# Patient Record
Sex: Male | Born: 1940
Health system: Southern US, Community
[De-identification: ages and names within clinical notes are randomized; demographics above are authoritative.]

## PROBLEM LIST (undated history)

## (undated) DIAGNOSIS — I700 Atherosclerosis of aorta: Secondary | ICD-10-CM

## (undated) DIAGNOSIS — N189 Chronic kidney disease, unspecified: Secondary | ICD-10-CM

## (undated) DIAGNOSIS — I771 Stricture of artery: Secondary | ICD-10-CM

## (undated) DIAGNOSIS — I714 Abdominal aortic aneurysm, without rupture, unspecified: Secondary | ICD-10-CM

## (undated) DIAGNOSIS — I739 Peripheral vascular disease, unspecified: Secondary | ICD-10-CM

## (undated) DIAGNOSIS — I5030 Unspecified diastolic (congestive) heart failure: Secondary | ICD-10-CM

## (undated) DIAGNOSIS — J189 Pneumonia, unspecified organism: Secondary | ICD-10-CM

## (undated) DIAGNOSIS — I615 Nontraumatic intracerebral hemorrhage, intraventricular: Secondary | ICD-10-CM

## (undated) DIAGNOSIS — M1990 Unspecified osteoarthritis, unspecified site: Secondary | ICD-10-CM

## (undated) DIAGNOSIS — I350 Nonrheumatic aortic (valve) stenosis: Secondary | ICD-10-CM

## (undated) DIAGNOSIS — Z720 Tobacco use: Secondary | ICD-10-CM

## (undated) DIAGNOSIS — J449 Chronic obstructive pulmonary disease, unspecified: Secondary | ICD-10-CM

## (undated) DIAGNOSIS — I701 Atherosclerosis of renal artery: Secondary | ICD-10-CM

## (undated) DIAGNOSIS — E785 Hyperlipidemia, unspecified: Secondary | ICD-10-CM

## (undated) DIAGNOSIS — I779 Disorder of arteries and arterioles, unspecified: Secondary | ICD-10-CM

## (undated) DIAGNOSIS — E039 Hypothyroidism, unspecified: Secondary | ICD-10-CM

## (undated) DIAGNOSIS — I255 Ischemic cardiomyopathy: Secondary | ICD-10-CM

## (undated) DIAGNOSIS — I872 Venous insufficiency (chronic) (peripheral): Secondary | ICD-10-CM

## (undated) DIAGNOSIS — I10 Essential (primary) hypertension: Secondary | ICD-10-CM

## (undated) DIAGNOSIS — I480 Paroxysmal atrial fibrillation: Secondary | ICD-10-CM

## (undated) DIAGNOSIS — I82401 Acute embolism and thrombosis of unspecified deep veins of right lower extremity: Secondary | ICD-10-CM

## (undated) DIAGNOSIS — I2510 Atherosclerotic heart disease of native coronary artery without angina pectoris: Secondary | ICD-10-CM

## (undated) DIAGNOSIS — I509 Heart failure, unspecified: Secondary | ICD-10-CM

## (undated) HISTORY — DX: Acute embolism and thrombosis of unspecified deep veins of right lower extremity: I82.401

## (undated) HISTORY — DX: Nontraumatic intracerebral hemorrhage, intraventricular: I61.5

## (undated) HISTORY — DX: Paroxysmal atrial fibrillation: I48.0

## (undated) HISTORY — DX: Ischemic cardiomyopathy: I25.5

## (undated) HISTORY — DX: Disorder of arteries and arterioles, unspecified: I77.9

## (undated) HISTORY — DX: Stricture of artery: I77.1

## (undated) HISTORY — PX: CORONARY ARTERY BYPASS GRAFT: SHX141

## (undated) HISTORY — DX: Chronic kidney disease, unspecified: N18.9

## (undated) HISTORY — DX: Tobacco use: Z72.0

## (undated) HISTORY — DX: Atherosclerosis of aorta: I70.0

## (undated) HISTORY — PX: HERNIA REPAIR: SHX51

## (undated) HISTORY — DX: Unspecified diastolic (congestive) heart failure: I50.30

## (undated) HISTORY — PX: TOOTH EXTRACTION: SUR596

## (undated) HISTORY — DX: Unspecified osteoarthritis, unspecified site: M19.90

## (undated) HISTORY — DX: Hyperlipidemia, unspecified: E78.5

## (undated) HISTORY — PX: CHOLECYSTECTOMY: SHX55

## (undated) HISTORY — DX: Chronic obstructive pulmonary disease, unspecified: J44.9

## (undated) HISTORY — DX: Nonrheumatic aortic (valve) stenosis: I35.0

## (undated) HISTORY — DX: Venous insufficiency (chronic) (peripheral): I87.2

## (undated) HISTORY — DX: Peripheral vascular disease, unspecified: I73.9

## (undated) HISTORY — DX: Pneumonia, unspecified organism: J18.9

## (undated) SURGERY — LOWER EXTREMITY ANGIOGRAPHY
Anesthesia: Moderate Sedation

---

## 2006-07-11 ENCOUNTER — Ambulatory Visit: Payer: Self-pay | Admitting: Family Medicine

## 2006-09-07 ENCOUNTER — Ambulatory Visit: Payer: Self-pay | Admitting: Family Medicine

## 2006-09-07 DIAGNOSIS — E039 Hypothyroidism, unspecified: Secondary | ICD-10-CM | POA: Insufficient documentation

## 2006-09-08 LAB — CONVERTED CEMR LAB
CO2: 29 meq/L (ref 19–32)
Cholesterol: 152 mg/dL (ref 0–200)
HDL: 40.5 mg/dL (ref >39.0)
LDL Cholesterol: 93 mg/dL (ref 0–99)
PSA: 6.95 ng/mL — ABNORMAL HIGH (ref 0.10–4.00)
Potassium: 4.3 meq/L (ref 3.5–5.1)

## 2006-09-15 ENCOUNTER — Encounter: Payer: Self-pay | Admitting: Family Medicine

## 2006-09-25 ENCOUNTER — Encounter (INDEPENDENT_AMBULATORY_CARE_PROVIDER_SITE_OTHER): Payer: Self-pay | Admitting: *Deleted

## 2006-09-25 ENCOUNTER — Ambulatory Visit: Payer: Self-pay | Admitting: Family Medicine

## 2008-03-18 ENCOUNTER — Telehealth (INDEPENDENT_AMBULATORY_CARE_PROVIDER_SITE_OTHER): Payer: Self-pay | Admitting: *Deleted

## 2008-03-19 ENCOUNTER — Encounter (INDEPENDENT_AMBULATORY_CARE_PROVIDER_SITE_OTHER): Payer: Self-pay | Admitting: *Deleted

## 2008-04-11 ENCOUNTER — Telehealth: Payer: Self-pay | Admitting: Family Medicine

## 2008-04-11 ENCOUNTER — Ambulatory Visit: Payer: Self-pay | Admitting: Family Medicine

## 2008-04-11 DIAGNOSIS — I739 Peripheral vascular disease, unspecified: Secondary | ICD-10-CM | POA: Insufficient documentation

## 2008-04-11 DIAGNOSIS — Z87891 Personal history of nicotine dependence: Secondary | ICD-10-CM | POA: Insufficient documentation

## 2008-04-11 DIAGNOSIS — F17200 Nicotine dependence, unspecified, uncomplicated: Secondary | ICD-10-CM

## 2008-04-11 DIAGNOSIS — R972 Elevated prostate specific antigen [PSA]: Secondary | ICD-10-CM | POA: Insufficient documentation

## 2008-04-11 HISTORY — DX: Personal history of nicotine dependence: Z87.891

## 2008-04-16 ENCOUNTER — Ambulatory Visit: Payer: Self-pay | Admitting: Family Medicine

## 2008-04-23 LAB — CONVERTED CEMR LAB
Alkaline Phosphatase: 68 units/L (ref 39–117)
BUN: 14 mg/dL (ref 6–23)
CO2: 29 meq/L (ref 19–32)
Chloride: 108 meq/L (ref 96–112)
Cholesterol: 167 mg/dL (ref 0–200)
GFR calc Af Amer: 86 mL/min
HDL: 48.6 mg/dL (ref >39.0)
LDL Cholesterol: 99 mg/dL (ref 0–99)
PSA: 3.63 ng/mL (ref 0.10–4.00)
Sodium: 140 meq/L (ref 135–145)
Total Bilirubin: 0.7 mg/dL (ref 0.3–1.2)
Triglycerides: 99 mg/dL (ref 0–149)
VLDL: 20 mg/dL (ref 0–40)

## 2008-05-01 LAB — FECAL OCCULT BLOOD, GUAIAC: Fecal Occult Blood: NEGATIVE

## 2008-05-02 ENCOUNTER — Ambulatory Visit: Payer: Self-pay | Admitting: Family Medicine

## 2008-05-02 LAB — CONVERTED CEMR LAB: OCCULT 2: NEGATIVE

## 2008-05-05 ENCOUNTER — Encounter (INDEPENDENT_AMBULATORY_CARE_PROVIDER_SITE_OTHER): Payer: Self-pay | Admitting: *Deleted

## 2009-09-15 ENCOUNTER — Telehealth (INDEPENDENT_AMBULATORY_CARE_PROVIDER_SITE_OTHER): Payer: Self-pay | Admitting: *Deleted

## 2009-09-29 ENCOUNTER — Ambulatory Visit: Payer: Self-pay | Admitting: Family Medicine

## 2009-09-29 DIAGNOSIS — R5381 Other malaise: Secondary | ICD-10-CM

## 2009-09-29 DIAGNOSIS — R0602 Shortness of breath: Secondary | ICD-10-CM | POA: Insufficient documentation

## 2009-09-29 DIAGNOSIS — R5383 Other fatigue: Secondary | ICD-10-CM | POA: Insufficient documentation

## 2009-09-30 ENCOUNTER — Ambulatory Visit: Payer: Self-pay | Admitting: Family Medicine

## 2009-09-30 LAB — CONVERTED CEMR LAB
ALT: 25 units/L (ref 0–53)
AST: 23 units/L (ref 0–37)
Alkaline Phosphatase: 84 units/L (ref 39–117)
Basophils Absolute: 0.1 10*3/uL (ref 0.0–0.1)
Basophils Relative: 0.6 % (ref 0.0–3.0)
Bilirubin, Direct: 0.2 mg/dL (ref 0.0–0.3)
Calcium: 9.6 mg/dL (ref 8.4–10.5)
Chloride: 107 meq/L (ref 96–112)
Cholesterol: 183 mg/dL (ref 0–200)
Creatinine, Ser: 0.9 mg/dL (ref 0.4–1.5)
Eosinophils Absolute: 0.1 10*3/uL (ref 0.0–0.7)
Folate: 16 ng/mL
GFR calc non Af Amer: 95.18 mL/min (ref >60)
HDL: 46.4 mg/dL (ref >39.00)
LDL Cholesterol: 114 mg/dL — ABNORMAL HIGH (ref 0–99)
Lymphocytes Relative: 24 % (ref 12.0–46.0)
Neutrophils Relative %: 66.5 % (ref 43.0–77.0)
PSA: 2.79 ng/mL (ref 0.10–4.00)
RBC: 5.39 M/uL (ref 4.22–5.81)
Sodium: 142 meq/L (ref 135–145)
Total Bilirubin: 1 mg/dL (ref 0.3–1.2)
Total Protein: 7.1 g/dL (ref 6.0–8.3)
Triglycerides: 111 mg/dL (ref 0.0–149.0)
Vitamin B-12: 304 pg/mL (ref 211–911)
WBC: 8.2 10*3/uL (ref 4.5–10.5)

## 2009-10-08 ENCOUNTER — Ambulatory Visit: Payer: Self-pay | Admitting: Family Medicine

## 2009-10-08 DIAGNOSIS — I10 Essential (primary) hypertension: Secondary | ICD-10-CM | POA: Insufficient documentation

## 2009-10-08 LAB — CONVERTED CEMR LAB: Glucose, Bld: 95 mg/dL (ref 70–99)

## 2009-10-09 ENCOUNTER — Ambulatory Visit: Payer: Self-pay | Admitting: Family Medicine

## 2009-10-09 DIAGNOSIS — E7800 Pure hypercholesterolemia, unspecified: Secondary | ICD-10-CM | POA: Insufficient documentation

## 2009-10-13 ENCOUNTER — Telehealth (INDEPENDENT_AMBULATORY_CARE_PROVIDER_SITE_OTHER): Payer: Self-pay | Admitting: *Deleted

## 2009-10-15 ENCOUNTER — Encounter (HOSPITAL_COMMUNITY): Admission: RE | Admit: 2009-10-15 | Discharge: 2009-12-30 | Payer: Self-pay | Admitting: Family Medicine

## 2009-10-15 ENCOUNTER — Ambulatory Visit: Payer: Self-pay | Admitting: Cardiology

## 2009-10-15 ENCOUNTER — Ambulatory Visit: Payer: Self-pay

## 2009-10-15 ENCOUNTER — Encounter: Payer: Self-pay | Admitting: Cardiology

## 2009-10-22 ENCOUNTER — Telehealth: Payer: Self-pay | Admitting: Family Medicine

## 2009-11-03 ENCOUNTER — Ambulatory Visit: Payer: Self-pay

## 2009-11-03 ENCOUNTER — Encounter: Payer: Self-pay | Admitting: Family Medicine

## 2009-11-06 DIAGNOSIS — I714 Abdominal aortic aneurysm, without rupture, unspecified: Secondary | ICD-10-CM | POA: Insufficient documentation

## 2009-12-15 ENCOUNTER — Encounter: Payer: Self-pay | Admitting: Family Medicine

## 2009-12-15 ENCOUNTER — Ambulatory Visit: Payer: Self-pay | Admitting: Internal Medicine

## 2010-01-06 ENCOUNTER — Ambulatory Visit: Payer: Self-pay | Admitting: Family Medicine

## 2010-01-07 LAB — CONVERTED CEMR LAB: LDL Cholesterol: 115 mg/dL — ABNORMAL HIGH (ref 0–99)

## 2010-01-08 ENCOUNTER — Ambulatory Visit: Payer: Self-pay | Admitting: Family Medicine

## 2010-01-08 DIAGNOSIS — J449 Chronic obstructive pulmonary disease, unspecified: Secondary | ICD-10-CM | POA: Insufficient documentation

## 2010-01-26 ENCOUNTER — Encounter: Payer: Self-pay | Admitting: Family Medicine

## 2010-03-29 ENCOUNTER — Encounter: Payer: Self-pay | Admitting: Family Medicine

## 2010-04-05 ENCOUNTER — Ambulatory Visit: Payer: Self-pay | Admitting: Family Medicine

## 2010-04-06 LAB — CONVERTED CEMR LAB
Cholesterol: 164 mg/dL (ref 0–200)
VLDL: 25 mg/dL (ref 0.0–40.0)

## 2010-05-25 NOTE — Progress Notes (Signed)
Summary: levothroxine  Phone Note Refill Request Call back at Home Phone [phone] Message from:  Patient on Sep 15, 2009 11:00 AM  Refills Requested: Medication #1:  LEVOTHYROXINE SODIUM 200 MCG TABS take one by mouth daily Patient has scheduled an appt. for 09-29-09, but only has 2 pills left. Wants to know if he can have a refill until appt.   Initial call taken by: Melody Comas,  Sep 15, 2009 11:01 AM Caller: Patient Call For: Kerby Nora MD    Prescriptions: LEVOTHYROXINE SODIUM 200 MCG TABS (LEVOTHYROXINE SODIUM) take one by mouth daily  #30 x 0   Entered by:   Benny Lennert CMA (AAMA)   Authorized by:   Kerby Nora MD   Signed by:   Benny Lennert CMA (AAMA) on 09/15/2009   Method used:   Electronically to        [facility]* (retail)       [address] [place], [place]  [address]       Ph: [phone]       Fax: [phone]   RxID:   [id number]

## 2010-05-25 NOTE — Miscellaneous (Signed)
Summary: Orders Update  Clinical Lists Changes  Orders: Added new Test order of Abdominal Aorta Duplex (Abd Aorta Duplex) - Signed 

## 2010-05-25 NOTE — Assessment & Plan Note (Signed)
Summary: 2 week follow up/rbh   Vital Signs:  Patient profile:   70 year old male Height:      68 inches Weight:      159.4 pounds BMI:     24.32 Temp:     98.2 degrees F oral Pulse rate:   80 / minute Pulse rhythm:   regular BP sitting:   120 / 80  (left arm) Cuff size:   regular  Vitals Entered By: Benny Lennert CMA Duncan Dull) (October 09, 2009 3:08 PM) CC: 2 wk follow up   History of Present Illness: Fatigue: likely multifactorial..Marland KitchenCOPD changes on CXR and lung exam. Awaiting LFTS. Given risk factors concern for Cardiac source of fatigue.   Per pt congestion has improved. Fatigue somewhat improved. Never had stress test.  Taking protein supplement..likely cause of high potassium.   High cholesterol..LDL not at goal <70. Smoker...working on [employer].  PVD, stable per Dr. Evette Cristal...high risk for CAD.   Problems Prior to Update: 1)  Essential Hypertension, Benign  (ICD-401.1) 2)  Fatigue  (ICD-780.79) 3)  Shortness of Breath  (ICD-786.05) 4)  Prostate Specific Antigen, Elevated  (ICD-790.93) 5)  Unspecified Peripheral Vascular Disease  (ICD-443.9) 6)  Tobacco Abuse  (ICD-305.1) 7)  Bronchitis, Obstructive Chronic  (ICD-491.20) 8)  Screening For Malignannt Neoplasm, Site Nec  (ICD-V76.49) 9)  Hypothyroidism Nos  (ICD-244.9) 10)  Screening For Lipoid Disorders  (ICD-V77.91)  Current Medications (verified): 1)  Levothyroxine Sodium 200 Mcg Tabs (Levothyroxine Sodium) .... Take One By Mouth Daily 2)  Aspirin 81 Mg  Tabs (Aspirin) .... Take 1 Tablet By Mouth Once A Day  Allergies (verified): No Known Drug Allergies  Past History:  Past medical, surgical, family and social histories (including risk factors) reviewed, and no changes noted (except as noted below).  Past Medical History: Reviewed history from 04/11/2008 and no changes required. Current Problems:  SCREENING FOR MALIGNANNT NEOPLASM, SITE NEC (ICD-V76.49) HYPOTHYROIDISM NOS (ICD-244.9) SCREENING FOR LIPOID  DISORDERS (ICD-V77.91)   Venous insufficiency.  Peripheral arterial disease.  Tobacco abuse.  Slightly elevated PSA.  BPH.  Osteoarthritis.    Family History: Reviewed history and no changes required.  Social History: Reviewed history and no changes required.  Review of Systems General:  Complains of fatigue; denies fever and loss of appetite. CV:  Denies chest pain or discomfort. Resp:  Complains of shortness of breath; denies sputum productive and wheezing. GI:  Denies abdominal pain and bloody stools. GU:  Denies dysuria.  Physical Exam  General:  Well-developed,well-nourished,in no acute distress; alert,appropriate and cooperative throughout examination Mouth:  Oral mucosa and oropharynx without lesions or exudates.  Teeth in good repair. Neck:  no carotid bruit or thyromegaly no cervical or supraclavicular lymphadenopathy  Lungs:  diffuse rhonchi. but less than at last OV ...no focal changes, good air movement throughout Heart:  Normal rate and regular rhythm. S1 and S2 normal without gallop, murmur, click, rub or other extra sounds. Abdomen:  Bowel sounds positive,abdomen soft and non-tender without masses, organomegaly or hernias noted. Pulses:  R and L posterior tibial pulses are full and equal bilaterally  Extremities:  No clubbing, cyanosis, edema, or deformity noted with normal full range of motion of all joints.   Psych:  Cognition and judgment appear intact. Alert and cooperative with normal attention span and concentration. No apparent delusions, illusions, hallucinations   Impression & Recommendations:  Problem # 1:  HYPERCHOLESTEROLEMIA (ICD-272.0) Start fish oil and red yeast rice. Info on diet and lifestyle changes given. Recheck fasting LIPIDS,  AST, ALT  in 3 months Dx 272.0   If not at goal will start a medicaiton.  Orders: Cardiolite (Cardiolite)  Labs Reviewed: SGOT: 23 (09/29/2009)   SGPT: 25 (09/29/2009)   HDL:46.40 (09/29/2009), 48.6 (04/16/2008)   LDL:114 (09/29/2009), 99 (42/59/5638)  Chol:183 (09/29/2009), 167 (04/16/2008)  Trig:111.0 (09/29/2009), 99 (04/16/2008)  Problem # 2:  ESSENTIAL HYPERTENSION, BENIGN (ICD-401.1) Well controlled. Continue current medication.  BP today: 120/80 Prior BP: 110/60 (09/29/2009)  Labs Reviewed: K+: 4.8 (10/08/2009) Creat: : 1.0 (10/08/2009)   Chol: 183 (09/29/2009)   HDL: 46.40 (09/29/2009)   LDL: 114 (09/29/2009)   TG: 111.0 (09/29/2009)  Problem # 3:  FATIGUE (ICD-780.79) Likely multifactorial. LAb eval unrevrealing. Given risk factors ....concern for Cardiac source of fatigue.  EKG nml. Eval with cardiolyte.  Orders: EKG w/ Interpretation (93000) Cardiolite (Cardiolite)  Problem # 5:  SHORTNESS OF BREATH (ICD-786.05) Likely COPD given smoking hostopry. Awaiting results of Pulm LFTs. Likely needs Spiriva and inhaled steoid.   Complete Medication List: 1)  Levothyroxine Sodium 200 Mcg Tabs (Levothyroxine sodium) .... Take one by mouth daily 2)  Aspirin 81 Mg Tabs (Aspirin) .... Take 1 tablet by mouth once a day  Patient Instructions: 1)  Keep appt for lung function tests in next few months. 2)  Recheck fasting LIPIDS  in 3 months Dx 272.0    3)  Fish oil 2000 mg divided daily. 4)  Red Yeast rice 2400mg  divided daily. 5)  Please schedule a follow-up appointment in 3 months  30 min OV.  6)  Referral Appointment Information 7)  Day/Date: 8)  Time: 9)  Place/MD: 10)  Address: 11)  Phone/Fax: 12)  Patient given appointment information. Information/Orders faxed/mailed.   Current Allergies (reviewed today): No known allergies

## 2010-05-25 NOTE — Assessment & Plan Note (Signed)
Summary: CHECK THYROID,REFILL MEDICATION/CLE   Vital Signs:  Patient profile:   70 year old male Height:      68 inches Weight:      157.4 pounds BMI:     24.02 Temp:     97.7 degrees F oral Pulse rate:   80 / minute Pulse rhythm:   regular BP sitting:   110 / 60  (left arm) Cuff size:   regular  Vitals Entered By: Benny Lennert CMA Duncan Dull) (September 29, 2009 9:48 AM)  History of Present Illness: Chief complaint check thyroid/refill medication  In last year..has gradully felt more and more tired. less endurance. 5 lb weight loss..unintentional.  No night sweats.  Significant chest congestion...in last 1-52months. daily cough...occ productive white phelegm. No fever. No ear pain, no face pain. Some shortness of breath. Still smoking..but has decreased to 2 cigarettes a day.  Did not tolerate Chantix...causes chest pain.    Due for chol check and thyroid check.  Allergies (verified): No Known Drug Allergies  Past History:  Past medical, surgical, family and social histories (including risk factors) reviewed, and no changes noted (except as noted below).  Past Medical History: Reviewed history from 04/11/2008 and no changes required. Current Problems:  SCREENING FOR MALIGNANNT NEOPLASM, SITE NEC (ICD-V76.49) HYPOTHYROIDISM NOS (ICD-244.9) SCREENING FOR LIPOID DISORDERS (ICD-V77.91)   Venous insufficiency.  Peripheral arterial disease.  Tobacco abuse.  Slightly elevated PSA.  BPH.  Osteoarthritis.    Family History: Reviewed history and no changes required.  Social History: Reviewed history and no changes required.  Review of Systems       B shoulder pain.Marland Kitchenjoints crunch when move them... General:  Complains of fatigue, malaise, and weight loss; denies fever, sweats, and weakness. CV:  Denies chest pain or discomfort. Resp:  Complains of shortness of breath, sputum productive, and wheezing. GI:  Denies bloody stools, constipation, and diarrhea. GU:   Denies dysuria.  Physical Exam  General:  Well-developed,well-nourished,in no acute distress; alert,appropriate and cooperative throughout examination Eyes:  arcus senilus Ears:  External ear exam shows no significant lesions or deformities.  Otoscopic examination reveals clear canals, tympanic membranes are intact bilaterally without bulging, retraction, inflammation or discharge. Hearing is grossly normal bilaterally. Nose:  External nasal examination shows no deformity or inflammation. Nasal mucosa are pink and moist without lesions or exudates. Mouth:  Oral mucosa and oropharynx without lesions or exudates.  Teeth in good repair. Neck:  no carotid bruit or thyromegaly no cervical or supraclavicular lymphadenopathy  Lungs:  diffuse rhonchi..no focal changes, good air movement throughout Heart:  Normal rate and regular rhythm. S1 and S2 normal without gallop, murmur, click, rub or other extra sounds. Abdomen:  Bowel sounds positive,abdomen soft and non-tender without masses, organomegaly or hernias noted. Pulses:  R and L posterior tibial pulses are full and equal bilaterally  Extremities:  No clubbing, cyanosis, edema, or deformity noted with normal full range of motion of all joints.   Skin:  Intact without suspicious lesions or rashes Psych:  Cognition and judgment appear intact. Alert and cooperative with normal attention span and concentration. No apparent delusions, illusions, hallucinations   Impression & Recommendations:  Problem # 1:  FATIGUE (ICD-780.79) Eval with labs...if neg...consider lung and heart source.  Orders: TLB-BMP (Basic Metabolic Panel-BMET) (80048-METABOL) TLB-CBC Platelet - w/Differential (85025-CBCD) TLB-Hepatic/Liver Function Pnl (80076-HEPATIC) TLB-B12 + Folate Pnl ([id number]-J47/WGN) CXR- 2view (CXR)  Problem # 2:  SHORTNESS OF BREATH (ICD-786.05) No clear acute infection...likely COPD given smoking history. Cehck CXR given  weight loss, fatigue and  smoking history. Obtain LFTs for  COPD eval.  Orders: Misc. Referral (Misc. Ref) CXR- 2view (CXR)  Complete Medication List: 1)  Levothyroxine Sodium 200 Mcg Tabs (Levothyroxine sodium) .... Take one by mouth daily 2)  Aspirin 81 Mg Tabs (Aspirin) .... Take 1 tablet by mouth once a day  Other Orders: TLB-TSH (Thyroid Stimulating Hormone) (84443-TSH) TLB-PSA (Prostate Specific Antigen) (84153-PSA) TLB-Lipid Panel (80061-LIPID)  Patient Instructions: 1)  Referral Appointment Information 2)  Day/Date: 3)  Time: 4)  Place/MD: 5)  Address: 6)  Phone/Fax: 7)  Patient given appointment information. Information/Orders faxed/mailed.  8)  Return for chest Xray tommorow with Terri..Order in computer already.  9)  QUIT SMOKING. 10)  Please schedule a follow-up appointment in 2 weeks.   Current Allergies (reviewed today): No known allergies

## 2010-05-25 NOTE — Miscellaneous (Signed)
Summary: Orders Update pft charges  Clinical Lists Changes  Orders: Added new Service order of Carbon Monoxide diffusing w/capacity (94720) - Signed Added new Service order of Lung Volumes (94240) - Signed Added new Service order of Spirometry (Pre & Post) (94060) - Signed 

## 2010-05-25 NOTE — Assessment & Plan Note (Signed)
Summary: sopb/apc   Primary Provider/Referring Provider:  Dr Ermalene Searing  CC:  Pulmonary Consult-[facility]  had PFTs today.Dr. Ermalene Searing.[name]  History of Present Illness: December 31, 2009- 69yoM referred courtesy of Dr  Ermalene Searing because of COPD. Notes reviewed. He has smoked 1 PPD for over 50 years, but denies past hx of diagnosed lung disease. His main complaint is of fatigue with weight loss and lack of stamina, more than dyspnea. He says he doesn't really get breathless, and he denies productive  cough or wheeze, exertional chest pain or palpitation. He personally feels his problem is that shoulder pain interferes with sleep, so he sleeps poorly, in 2 hour intervals. Naps help him feel better.  He is known hypothyroid. I asked about his dark skin color, just back from beach- no hx of adrenal insufficiency.  CXR 09/30/09- Chronic luing disease and scarring, NAD. Hgb 47.7. ECHO- unremarkable, with EF 50-55%, no ischemic changes and normal right side. PFT- 12/31/2009- mild obstructive disease with airtrapping, insignif response to bronchodilator. FEV1 2.30/ 79%; FEV1/FVC 0.66.  Preventive Screening-Counseling & Management  Alcohol-Tobacco     Smoking Status: current     Smoke Cessation Stage: contemplative     Packs/Day: 1.0 x 50 years     Tobacco Counseling: to quit use of tobacco products  Comments: Referred to [facility] program  Current Medications (verified): 1)  Levothyroxine Sodium 200 Mcg Tabs (Levothyroxine Sodium) .... Take One By Mouth Daily 2)  Aspirin 81 Mg  Tabs (Aspirin) .... 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  Peripheral arterial disease.  Tobacco abuse. COPD- mild- PFTSeptember 08, 2011 FEV1/FVC 0.66  Slightly elevated PSA.  BPH.  Osteoarthritis.     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  The patient denies shortness of breath with activity, shortness of breath at rest, productive cough, coughing up blood, chest pain, irregular heartbeats, acid heartburn, indigestion, loss of appetite, weight change, abdominal pain, difficulty swallowing, sore throat, tooth/dental problems, headaches, nasal congestion/difficulty breathing through nose, sneezing, itching, ear ache, anxiety, depression, hand/feet swelling, joint stiffness or pain, rash, change in color of mucus, and fever.         Shoulder pains when lying down, interfere with sleep Denies snoring  Vital Signs:  Patient profile:   70 year old male Height:      69 inches Weight:      158.25 pounds BMI:     23.45 O2 Sat:      95 % on Room air Pulse rate:   98 / minute BP sitting:   138 / 76  (left arm) Cuff size:   regular  Vitals Entered By:  Reynaldo Minium CMA (December 15, 2009 2:30 PM)  O2 Flow:  Room air CC: Pulmonary Consult-[facility]  had PFTs today.Dr. Ermalene Searing.   Physical Exam  Additional Exam:  General: A/Ox3; pleasant and cooperative, NAD, SKIN: no rash, lesions. Deeply tanned. Palm lines are not hyperpigmented. NODES: no lymphadenopathy HEENT: [place]/AT, EOM- WNL, Conjuctivae- clear,  PERRLA, TM-WNL, Nose- clear, Throat- clear and wnl,  NECK: Supple w/ fair ROM, JVD- none, normal carotid impulses w/o bruits Thyroid- normal to palpation CHEST: Dry raspy quality to breath sounds in lung bases without wheeze, rub, dullness or cough. Airlow is reduced. No increased work of breathing or accessory muscle use. HEART: RRR, no m/g/r heard ABDOMEN: Soft and nl; nml bowel sounds; no organomegaly or masses noted, lean  ZOX:WRUE, nl pulses, no edema, cyanosis or clubbing  NEURO: Grossly intact to observation      Impression & Recommendations:  Problem # 1:  FATIGUE (ICD-780.79) PFTs are not so abnormal that I would expect him to feel so limited. Fatigue is better description of his complaint than dyspnea. He is smoking too much and will be directed to [facility] progtram.  Tanned- Consider Addison's ? If remains sleepy , can do sleep study. He seems pretty clear that he doesn't sleep well because his shoulders hurt, so would suggest appropriate Rx or referral to address this. COPD is mild, not likely to explain his complaint. A therapeutic trial of Advair or Spiriva could be considered, to see if he felt effect from it. Weight loss ois not explained. A cardiopulmonary stress test might be appropriate for sorting out how much of a dyspnea problem is related to lungs vs other etiologies, but I don't know that it would be helpful now.  Other Orders: Consultation Level IV (45409)  Patient Instructions: 1)  Please schedule a follow-up appointment as needed. 2)  Flyer for [facility] smoking cessation program. 3)  Regular endurance exercise and naps may both be helpful. 4)  Dr Ermalene Searing can work with you on the shoulder pain so hopefully you can sleep better. 5)  cc Dr Ermalene Searing

## 2010-05-25 NOTE — Assessment & Plan Note (Signed)
Summary: F/U ON LABS [facility]   Vital Signs:  Patient profile:   70 year old male Height:      69 inches Weight:      164.0 pounds BMI:     24.31 Temp:     97.8 degrees F oral Pulse rate:   84 / minute Pulse rhythm:   regular BP sitting:   120 / 84  (left arm) Cuff size:   regular  Vitals Entered By: Benny Lennert CMA Duncan Dull) (January 08, 2010 8:49 AM)  History of Present Illness: Chief complaint follow up labs  High chol...inadequate control...goal LDL <70 given PAD... but this was a stent where he had injury in past...20 years ago...per pt artery pinched not really blocked with cholesterol. Eating fruit and veggies...had been eating a lot of red meat but has now stopped. Sees Dr. Evette Cristal.  Not interested in medication.  Eating eggs for breakfast, tunafish sandwich  Has been drinking whole milk  Exercisng 3 days a week.  Shoulder pain improved. Increase in energy.  HAs been using Boost to try to gain weight.  HTn, well controlled.  Dyspnea...mild COPD.Marland Kitchennot interested in  Neg stress test but hypokinesis, ECHo nml.   Continues to smoke.  Problems Prior to Update: 1)  Abdominal Aortic Aneurysm  (ICD-441.4) 2)  Hypercholesterolemia  (ICD-272.0) 3)  Essential Hypertension, Benign  (ICD-401.1) 4)  Fatigue  (ICD-780.79) 5)  Shortness of Breath  (ICD-786.05) 6)  Prostate Specific Antigen, Elevated  (ICD-790.93) 7)  Unspecified Peripheral Vascular Disease  (ICD-443.9) 8)  Tobacco Abuse  (ICD-305.1) 9)  Bronchitis, Obstructive Chronic  (ICD-491.20) 10)  Screening For Malignannt Neoplasm, Site Nec  (ICD-V76.49) 11)  Hypothyroidism Nos  (ICD-244.9) 12)  Screening For Lipoid Disorders  (ICD-V77.91)  Current Medications (verified): 1)  Levothyroxine Sodium 200 Mcg Tabs (Levothyroxine Sodium) .... Take One By Mouth Daily 2)  Aspirin 81 Mg  Tabs (Aspirin) .... Take 1 Tablet By Mouth Once A Day  Allergies (verified): No Known Drug Allergies  Past History:  Past medical,  surgical, family and social histories (including risk factors) reviewed, and no changes noted (except as noted below).  Past Medical History: Reviewed history from 12/15/2009 and no changes required. SCREENING FOR MALIGNANNT NEOPLASM, SITE NEC (ICD-V76.49) HYPOTHYROIDISM NOS (ICD-244.9) SCREENING FOR LIPOID DISORDERS (ICD-V77.91)   Venous insufficiency.  Peripheral arterial disease.  Tobacco abuse. COPD- mild- PFT8/23/11 FEV1/FVC 0.66  Slightly elevated PSA.  BPH.  Osteoarthritis.     Past Surgical History: Reviewed history from 12/15/2009 and no changes required. Cholecystectomy-1996 peripheral angioplasty/ leg stent 1997  Family History: Reviewed history from 12/15/2009 and no changes required. Mother died old age at 26 father- died cirrhosis/ ETOH  Social History: Reviewed history from 12/15/2009 and no changes required. Married with children Retired Art gallery manager, then [employer]  Smoker x 50years at 1ppd No ETOH  Review of Systems General:  Denies fatigue and fever. CV:  Denies chest pain or discomfort. Resp:  Denies shortness of breath, sputum productive, and wheezing. GI:  Denies abdominal pain. GU:  Denies dysuria.  Physical Exam  General:  Well-developed,well-nourished,in no acute distress; alert,appropriate and cooperative throughout examination, tanned Mouth:  MMM Neck:  no carotid bruit or thyromegaly no cervical or supraclavicular lymphadenopathy  Lungs:  Normal respiratory effort, chest expands symmetrically. Lungs are clear to auscultation, no crackles or wheezes. Heart:  Normal rate and regular rhythm. S1 and S2 normal without gallop, murmur, click, rub or other extra sounds. Abdomen:  Bowel sounds positive,abdomen soft and non-tender without masses,  organomegaly or hernias noted. Pulses:  R and L posterior tibial pulses are full and equal bilaterally  Extremities:  No clubbing, cyanosis, edema, or deformity noted with normal full range of motion of all  joints.   Skin:  Intact without suspicious lesions or rashes   Impression & Recommendations:  Problem # 1:  HYPERCHOLESTEROLEMIA (ICD-272.0) Wisheds to continue lifestyle change..hold off on med...recehck in 3 months.  PAD...sounds more like structural issue as opposed to blockage...loosen Goal LDL 70-100  Problem # 2:  ESSENTIAL HYPERTENSION, BENIGN (ICD-401.1) Well controlled. Continue current medication.   Problem # 3:  FATIGUE (ICD-780.79) Improved with exercsie. Neg lab and cards eval.  Problem # 4:  SHORTNESS OF BREATH (ICD-786.05) mild COPD...wishes to hold off on med at this time.   Complete Medication List: 1)  Levothyroxine Sodium 200 Mcg Tabs (Levothyroxine sodium) .... Take one by mouth daily 2)  Aspirin 81 Mg Tabs (Aspirin) .... Take 1 tablet by mouth once a day  Patient Instructions: 1)  Decrease mayo, eggs, cheese, change to skim or 2 % milk. 2)  Fasting lipids Dx 272.0 in 3 months.  3)  Please schedule a follow-up appointment in 6 months HTN, fatigue.   Contraindications/Deferment of Procedures/Staging:    Test/Procedure: FLU VAX    Reason for deferment: patient declined   Current Allergies (reviewed today): No known allergies    Flu Vaccine Next Due:  Refused

## 2010-05-25 NOTE — Assessment & Plan Note (Signed)
Summary: Cardiology Nuclear Study  Nuclear Med Background Indications for Stress Test: Evaluation for Ischemia   History: COPD  History Comments:  ~'98 Stent-(L) LE  Symptoms: Fatigue, SOB    Nuclear Pre-Procedure Cardiac Risk Factors: Hypertension, PVD, Smoker Caffeine/Decaff Intake: None NPO After: 7:00 PM Lungs: Diminished, but clear.  O2 Sat 98% on RA. IV 0.9% NS with Angio Cath: 20g     IV Site: (R) AC IV Started by: Irean Hong RN Chest Size (in) 38     Height (in): 68 Weight (lb): 154 BMI: 23.50  Nuclear Med Study 1 or 2 day study:  1 day     Stress Test Type:  Eugenie Birks Reading MD:  Marca Ancona, MD     Referring MD:  Kerby Nora, MD Resting Radionuclide:  Technetium 74m Tetrofosmin     Resting Radionuclide Dose:  11.0 mCi  Stress Radionuclide:  Technetium 43m Tetrofosmin     Stress Radionuclide Dose:  33.0 mCi   Stress Protocol   Lexiscan: 0.4 mg   Stress Test Technologist:  Rea [employer] CMA-N     Nuclear Technologist:  Domenic Polite CNMT  Rest Procedure  Myocardial perfusion imaging was performed at rest 45 minutes following the intravenous administration of Myoview Technetium 15m Tetrofosmin.  Stress Procedure  The patient initially walked on the treadmill for 5:54, but was unable to get his heart rate up due to bilateral calf pain.  The patient then received IV Lexiscan 0.4 mg over 15-seconds.  Myoview injected at 30-seconds.  There were no significant changes with infusion.  Quantitative spect images were obtained after a 45 minute delay.  QPS Raw Data Images:  Normal; no motion artifact; normal heart/lung ratio. Stress Images:  Small apical perfusion defect.  Rest Images:  Small apical perfusion defect.  Subtraction (SDS):  Fixed small apical perfusion defect.  Transient Ischemic Dilatation:  1.02  (Normal <1.22)  Lung/Heart Ratio:  .24  (Normal <0.45)  Quantitative Gated Spect Images QGS EDV:  100 ml QGS ESV:  51 ml QGS EF:  49 % QGS cine  images:  Mild global hypokinesis.    Overall Impression  Exercise Capacity: Lexiscan study BP Response: Normal blood pressure response. Clinical Symptoms: Bilateral calf pain on treadmill so converted to Lexiscan. Felt short of breath with Lexiscan.  ECG Impression: No significant ST segment change suggestive of ischemia. Overall Impression: Probable apical thinning with no evidence for ischemia or infarction.  EF mildly decreased with global hypokinesis.  Overall Impression Comments: Recommend echo to confirm EF.  Appended Document: Cardiology Nuclear Study Notify pt that there was no clear ischemia on stress test, but possible decreased squeezing of heart throughout...cardiologist recommends ECHo. Let me know if pt agreeable to ordering vs cardiology7 referral to discuss results.  Appended Document: Cardiology Nuclear Study Patient agreeable to you referring.Consuello Masse CMA

## 2010-05-25 NOTE — Progress Notes (Signed)
Summary: Nuclear pre procedure  Phone Note Outgoing Call Call back at [facility] Phone [phone]   Call placed by: Rea College, CMA,  October 13, 2009 4:43 PM Call placed to: Patient Summary of Call: Reviewed information on Myoview Information Sheet (see scanned document for further details).  Spoke with wife.      Nuclear Med Background Indications for Stress Test: Evaluation for Ischemia   History: COPD   Symptoms: Fatigue, SOB    Nuclear Pre-Procedure Cardiac Risk Factors: Hypertension, PVD, Smoker Height (in): 68

## 2010-05-25 NOTE — Progress Notes (Signed)
Summary: Pt want to discuss the additional test...  Phone Note Call from Patient   Caller: Patient Summary of Call: Pt called, wants more informatiion regarding the addtional test needed. Pt want  to know if this is something serious that needs to be taken care of asap. Pt needs clarity, w/ his situations.  Pt has planned a trip for July 21th, and will be gone for 1 mth. Wants to discuss.Daine Gip  October 22, 2009 2:14 PM   Initial call taken by: Daine Gip,  October 22, 2009 2:14 PM  Follow-up for Phone Call        echo recommended which is reasonable  will let AEB d/w pt.  Follow-up by: [name] MD,  October 22, 2009 2:41 PM  Additional Follow-up for Phone Call Additional follow up Details #1::        Discussed issue with pt in detail. Open to referral for ECHO...will send referral. Additional Follow-up by: Kerby Nora MD,  October 23, 2009 5:39 PM

## 2010-05-27 NOTE — Letter (Signed)
Summary: [facility]  [facility]   Imported By: Lanelle Bal 04/08/2010 15:51:11  _____________________________________________________________________  External Attachment:    Type:   Image     Comment:   External Document

## 2010-05-29 ENCOUNTER — Encounter: Payer: Self-pay | Admitting: Family Medicine

## 2010-07-16 ENCOUNTER — Ambulatory Visit (INDEPENDENT_AMBULATORY_CARE_PROVIDER_SITE_OTHER): Payer: Medicare Other | Admitting: Family Medicine

## 2010-07-16 ENCOUNTER — Encounter: Payer: Self-pay | Admitting: Family Medicine

## 2010-07-16 DIAGNOSIS — E7800 Pure hypercholesterolemia, unspecified: Secondary | ICD-10-CM

## 2010-07-16 DIAGNOSIS — F17200 Nicotine dependence, unspecified, uncomplicated: Secondary | ICD-10-CM

## 2010-07-16 DIAGNOSIS — E039 Hypothyroidism, unspecified: Secondary | ICD-10-CM

## 2010-07-16 DIAGNOSIS — Z125 Encounter for screening for malignant neoplasm of prostate: Secondary | ICD-10-CM

## 2010-07-16 DIAGNOSIS — I10 Essential (primary) hypertension: Secondary | ICD-10-CM

## 2010-07-16 NOTE — Assessment & Plan Note (Signed)
Improved since last check  6 months ago.  Loosened LDL goal to less than 100.. Given no true PVD... Just structural issue in past in peripheral arteries. On no medication.

## 2010-07-16 NOTE — Progress Notes (Signed)
  Subjective:    Patient ID: [name], male    DOB: Mar 25, 1941, 70 y.o.   MRN: [id number]  HPI Doing well overall. Here to discuss labs and reeval chronic issues. He has continued to exercise several times a week and has put on muscle weight. He is able to take deeper breaths and feels "the best i have in 20 years"   Review of Systems  Constitutional: Negative for fever.  HENT: Positive for congestion and rhinorrhea. Negative for neck pain.   Respiratory: Negative for chest tightness and shortness of breath.        Shortness of breath improved after exercising over last 6 months.  Cardiovascular: Negative for chest pain.  Gastrointestinal: Negative for abdominal pain.  Genitourinary: Negative for dysuria.  Skin: Negative for rash.  Psychiatric/Behavioral: Negative for behavioral problems.       Objective:   Physical Exam  Constitutional: He appears well-developed and well-nourished.  Non-toxic appearance. He does not appear ill. No distress.  HENT:  Head: Normocephalic and atraumatic.  Right Ear: Hearing, tympanic membrane, external ear and ear canal normal.  Left Ear: Hearing, tympanic membrane, external ear and ear canal normal.  Nose: Nose normal.  Mouth/Throat: Uvula is midline, oropharynx is clear and moist and mucous membranes are normal.  Eyes: Conjunctivae, EOM and lids are normal. Pupils are equal, round, and reactive to light. No foreign bodies found.  Neck: Trachea normal, normal range of motion and phonation normal. Neck supple. Carotid bruit is not present. No mass and no thyromegaly present.  Cardiovascular: Normal rate, regular rhythm, S1 normal, S2 normal, intact distal pulses and normal pulses.  Exam reveals no gallop.   No murmur heard.      Severe B varicosities B legs  Pulmonary/Chest: Breath sounds normal. He has no wheezes. He has no rhonchi. He has no rales.  Abdominal: Soft.  Genitourinary: Testes normal and penis normal.  Lymphadenopathy:    He has no  cervical adenopathy.  Neurological: He is alert. No cranial nerve deficit or sensory deficit. Gait normal.  Skin: Skin is warm, dry and intact.  Psychiatric: He has a normal mood and affect. His speech is normal and behavior is normal. Judgment normal.          Assessment & Plan:

## 2010-07-16 NOTE — Patient Instructions (Signed)
Keep up good work on lifestyle. Continue work on quitting smoking. Stop at front desk to schedule CPX and labs prior in 6 months.

## 2010-07-16 NOTE — Assessment & Plan Note (Signed)
Has decreased cigarettes to 3-4 a day.. Working on decreasing further.  Not interested in medication for smoking cessation.

## 2010-07-16 NOTE — Assessment & Plan Note (Addendum)
Well controlled on no medication. BP Readings from Last 3 Encounters:  07/16/10 122/78  01/08/10 120/84  12/15/09 138/76

## 2010-07-16 NOTE — Assessment & Plan Note (Signed)
Lab Results  Component Value Date   TSH 1.23 09/29/2009

## 2010-09-10 NOTE — Assessment & Plan Note (Signed)
[facility]                           [facility] OFFICE NOTE   NAME:[name] [name]                         MRN:          [id number]  DATE:07/11/2006                            DOB:          12/30/1940    CHIEF COMPLAINT:  A 70 year old white male here to establish new doctor.   HISTORY OF PRESENT ILLNESS:  [name] [name] presents today to discuss the  following:  1. Hypothyroidism, chronic.  [name] has been on the same dose of      levothyroxine for 10 years at 200 mcg.  [name] thinks [name] is due for [name]      TSH check, but is unsure if it has been exactly a year.  [name] denies      any current symptoms including fatigue, weight gain, weight loss.  2. Tobacco cessation.  [name] currently smokes 3/4 of a pack per day.  [name]      has tried to quit several times in the past.  [name] has greater than      50-pack-year history.  [name] tried a medication in the past, but [name] is      unsure of the name, that [name] had a severe reaction to.   REVIEW OF SYSTEMS:  No headache, no hearing problems, no shortness of  breath, no chronic cough, no nausea, vomiting, diarrhea, constipation,  or rectal bleeding, no chest pain, no palpitations.   PAST MEDICAL HISTORY:  1. Hypothyroidism.  2. Venous insufficiency.  3. Peripheral arterial disease.  4. Tobacco abuse.  5. Slightly elevated PSA.  6. BPH.  7. Osteoarthritis.   HOSPITALIZATIONS/SURGERIES/PROCEDURES:  1. In 1994, stent in left popliteal artery.  2. In 2005, vein surgery bilaterally.  3. In 1990s cholecystectomy.  4. December 2005, prostate biopsy, negative.   ALLERGIES:  None.   MEDICATIONS:  1. Aspirin 81 mg daily.  2. Levothyroxine 200 mcg daily.   SOCIAL HISTORY:  Three quarters of a pack per day with greater than 50-  pack-year history.  One to three alcoholic beverages a year.  No history  of drug use.  [name] is retired and married.  [name] has 2 children who are  healthy.   FAMILY HISTORY:  [name] [name] died at age 33 with  cirrhosis of the liver  from alcohol abuse.  [name] alive at age 92 and relatively healthy as  far as [name] knows.  No MI in the family before age 46.  [name] has 1 [name] [name]  who is healthy.  [name] has a maternal [name] who had stomach cancer  which developed at age 62, and as far as [name] is aware, no other type of  cancer in [name] family.   PHYSICAL EXAMINATION:  VITAL SIGNS:  Height 5 feet 8 inches, weight 164,  blood pressure 162/91, pulse 87, temperature 97.7.  GENERAL:  Thin-appearing male, appears older than stated age with thick,  dark skin from sun exposure, in no acute distress.  CARDIOVASCULAR:  Regular rate and rhythm, no murmurs, rubs, or gallops.  Normal PMI, 2+ peripheral pulses.  LUNGS:  Clear to auscultation bilaterally, no  wheezes, rales, or  rhonchi.  ABDOMEN:  Soft, nontender, normoactive bowel sounds, no  hepatosplenomegaly.  MUSCULOSKELETAL:  Strength 5/5 in upper and lower extremities.  NEURO:  Cranial nerves II-XII are grossly intact.  Sensation intact in  upper and lower extremities.   ASSESSMENT AND PLAN:  1. Hypothyroidism, chronic.  Will check records to determine when last      thyroid stimulating hormone was done.  Patient will be due for a      refill of levothyroxine after this is checked.  2. Elevated blood pressure today.  [name] does not have a diagnosis of      hypertension.  [name] states [name] blood pressure is usually well      controlled at doctor's visits.  [name] will continue to follow [name]      blood pressure at home with [name] [name]'s cuff.  [name] will let me know      if it is greater than 140/90.  We will reevaluate this when [name]      returns.  3. Tobacco cessation.  We discussed this in detail for approximately      10 minutes for counseling.  [name] would like to try a medication such      as Chantix, but I am unsure if this is the medicine [name] has tried in      the past, and would like to review [name] records first.  4. Prevention.  It appears [name] is possibly due for a  PSA, keeping in      mind that [name] did have an elevated PSA in the past, as well as      benign prostatic hypertrophy and negative prostate biopsies.  [name] is      also likely due for a cholesterol panel.  I will review [name] records      to make sure these are due.  We did discuss continuing hemoccults      as a form of colon cancer screening, but I did mention the      possibility of colonoscopy to him.  At this point, we will continue      hemoccult cards.  [name] is encouraged to work on healthy eating habits      and regular exercise.     Kerby Nora, MD  Electronically Signed    AB/MedQ  DD: 07/12/2006  DT: 07/12/2006  Job #: [id number]

## 2010-11-22 ENCOUNTER — Other Ambulatory Visit: Payer: Self-pay | Admitting: *Deleted

## 2010-11-22 MED ORDER — LEVOTHYROXINE SODIUM 200 MCG PO TABS: 200.0000 ug | ORAL_TABLET | Freq: Every day | ORAL | Status: DC

## 2010-11-22 NOTE — Telephone Encounter (Signed)
Pt is travelling and needs a refill on levothyroxine.  Medicine called to pharmacy.

## 2010-12-22 ENCOUNTER — Other Ambulatory Visit (INDEPENDENT_AMBULATORY_CARE_PROVIDER_SITE_OTHER): Payer: Medicare Other

## 2010-12-22 DIAGNOSIS — E039 Hypothyroidism, unspecified: Secondary | ICD-10-CM

## 2010-12-22 DIAGNOSIS — E7800 Pure hypercholesterolemia, unspecified: Secondary | ICD-10-CM

## 2010-12-22 LAB — COMPREHENSIVE METABOLIC PANEL
ALT: 13 U/L (ref 0–53)
AST: 22 U/L (ref 0–37)
Albumin: 4 g/dL (ref 3.5–5.2)
CO2: 29 mEq/L (ref 19–32)
Calcium: 9.1 mg/dL (ref 8.4–10.5)
Chloride: 103 mEq/L (ref 96–112)
Creatinine, Ser: 1.3 mg/dL (ref 0.4–1.5)
GFR: 58.6 mL/min — ABNORMAL LOW (ref >60.00)
Potassium: 3.9 mEq/L (ref 3.5–5.1)
Sodium: 141 mEq/L (ref 135–145)
Total Protein: 6.7 g/dL (ref 6.0–8.3)

## 2010-12-22 LAB — LIPID PANEL: Total CHOL/HDL Ratio: 3

## 2010-12-22 LAB — TSH: TSH: 14.61 u[IU]/mL — ABNORMAL HIGH (ref 0.35–5.50)

## 2010-12-29 ENCOUNTER — Encounter: Payer: Self-pay | Admitting: Family Medicine

## 2010-12-29 ENCOUNTER — Ambulatory Visit (INDEPENDENT_AMBULATORY_CARE_PROVIDER_SITE_OTHER): Payer: Medicare Other | Admitting: Family Medicine

## 2010-12-29 DIAGNOSIS — Z125 Encounter for screening for malignant neoplasm of prostate: Secondary | ICD-10-CM

## 2010-12-29 DIAGNOSIS — R972 Elevated prostate specific antigen [PSA]: Secondary | ICD-10-CM

## 2010-12-29 DIAGNOSIS — E7800 Pure hypercholesterolemia, unspecified: Secondary | ICD-10-CM

## 2010-12-29 DIAGNOSIS — Z0000 Encounter for general adult medical examination without abnormal findings: Secondary | ICD-10-CM

## 2010-12-29 DIAGNOSIS — I10 Essential (primary) hypertension: Secondary | ICD-10-CM

## 2010-12-29 DIAGNOSIS — N400 Enlarged prostate without lower urinary tract symptoms: Secondary | ICD-10-CM | POA: Insufficient documentation

## 2010-12-29 DIAGNOSIS — E039 Hypothyroidism, unspecified: Secondary | ICD-10-CM

## 2010-12-29 MED ORDER — LEVOTHYROXINE SODIUM 25 MCG PO TABS: ORAL_TABLET | ORAL | Status: DC

## 2010-12-29 MED ORDER — LEVOTHYROXINE SODIUM 200 MCG PO TABS: ORAL_TABLET | ORAL | Status: DC

## 2010-12-29 NOTE — Patient Instructions (Addendum)
Add second tablet of low dose thyroid medication to regimen to 200 mcg daily. Return for TSH recehck in 4-6 weeks. Work on quitting smoking. Follow BP at home in next few weeks, call if greater than or equal to 140/90 x 3 measurements. Get back on track with diet and exercise. Check with insurance on coverage of shingles vaccine.

## 2010-12-29 NOTE — Assessment & Plan Note (Signed)
Borderline control. Follow at home. Stop smoking , get back on track with lifestyle.

## 2010-12-29 NOTE — Assessment & Plan Note (Signed)
Due for re-eval. 

## 2010-12-29 NOTE — Assessment & Plan Note (Signed)
Continue 200 mcg but add 25 mcg dose.. Recheck in 4-6 weeks. Take meds daily.

## 2010-12-29 NOTE — Progress Notes (Signed)
Subjective:    Patient ID: [name], male    DOB: Nov 26, 1940, 70 y.o.   MRN: [id number]  HPI  I have personally reviewed the Medicare Annual Wellness questionnaire and have noted 1. The patient's medical and social history 2. Their use of alcohol, tobacco or illicit drugs 3. Their current medications and supplements 4. The patient's functional ability including ADL's, fall risks, home safety risks and hearing or visual             impairment. 5. Diet and physical activities 6. Evidence for depression or mood disorders The patients weight, height, BMI and visual acuity have been recorded in the chart I have made referrals, counseling and provided education to the patient based review of the above and I have provided the pt with a written personalized care plan for preventive services.  Hypertension:  Borderline control on no medication. CONTINUES to SMOKE 4-5 a day.  Using medication without problems or lightheadedness:  Chest pain with exertion: None Edema:None Short of breath:None Average home BPs: Not checking at home Other issues:  Elevated Cholesterol: Loosened LDL goal to less than 100.. Given no true PVD... Just structural issue in past in peripheral arteries.  On no medication, almost at goal with LDL 117.  He has taken off  exercise last month given trip to [place], plans to restart exercise and stop eating out.  Hypothyroid:  Abnormal... Pt reports he has been taking medication daily, except when he was in [place] he was out for 3-4 days, but this was 3-4 weeks ago. He has been feeling tired and washed out at end of day. Dx 25 years ago. Lab Results  Component Value Date   TSH 14.61* 12/22/2010   Sees Vascular MD each fall for past stenting in legs.. Structural not true PVD.  Review of Systems  Constitutional: Positive for fatigue. Negative for fever and unexpected weight change.  HENT: Negative for ear pain, congestion, sore throat, rhinorrhea, trouble swallowing  and postnasal drip.   Eyes: Negative for pain.  Respiratory: Negative for cough, shortness of breath and wheezing.   Cardiovascular: Negative for chest pain, palpitations and leg swelling.  Gastrointestinal: Negative for nausea, abdominal pain, diarrhea, constipation and blood in stool.  Genitourinary: Negative for dysuria, urgency, hematuria, discharge, penile swelling, scrotal swelling, difficulty urinating, penile pain and testicular pain.  Skin: Negative for rash.  Neurological: Negative for syncope, weakness, light-headedness, numbness and headaches.  Psychiatric/Behavioral: Negative for behavioral problems and dysphoric mood. The patient is not nervous/anxious.        Objective:   Physical Exam  Constitutional: He appears well-developed and well-nourished.  Non-toxic appearance. He does not appear ill. No distress.  HENT:  Head: Normocephalic and atraumatic.  Right Ear: Hearing, tympanic membrane, external ear and ear canal normal.  Left Ear: Hearing, tympanic membrane, external ear and ear canal normal.  Nose: Nose normal.  Mouth/Throat: Uvula is midline, oropharynx is clear and moist and mucous membranes are normal.  Eyes: Conjunctivae, EOM and lids are normal. Pupils are equal, round, and reactive to light. No foreign bodies found.  Neck: Trachea normal, normal range of motion and phonation normal. Neck supple. Carotid bruit is not present. No mass and no thyromegaly present.  Cardiovascular: Normal rate, regular rhythm, S1 normal, S2 normal, intact distal pulses and normal pulses.  Exam reveals no gallop.   No murmur heard. Pulmonary/Chest: Breath sounds normal. He has no wheezes. He has no rhonchi. He has no rales.  Abdominal: Soft. Normal appearance and  bowel sounds are normal. There is no hepatosplenomegaly. There is no tenderness. There is no rebound, no guarding and no CVA tenderness. No hernia. Hernia confirmed negative in the right inguinal area and confirmed negative in  the left inguinal area.  Genitourinary: Testes normal and penis normal. Rectal exam shows no external hemorrhoid, no internal hemorrhoid, no fissure, no mass, no tenderness and anal tone normal. Guaiac negative stool. Prostate is enlarged. Prostate is not tender. Right testis shows no mass and no tenderness. Left testis shows no mass and no tenderness. No paraphimosis or penile tenderness.  Lymphadenopathy:    He has no cervical adenopathy.       Right: No inguinal adenopathy present.       Left: No inguinal adenopathy present.  Neurological: He is alert. He has normal strength and normal reflexes. No cranial nerve deficit or sensory deficit. Gait normal.  Skin: Skin is warm, dry and intact. No rash noted.  Psychiatric: He has a normal mood and affect. His speech is normal and behavior is normal. Judgment normal.          Assessment & Plan:  Annual Medicare Wellness: The patient's preventative maintenance and recommended screening tests for an annual wellness exam were reviewed in full today. Brought up to date unless services declined.  Counselled on the importance of diet, exercise, and its role in overall health and mortality. The patient's FH and SH was reviewed, including their home life, tobacco status, and drug and alcohol status.   PSA Zostavax discussed, otherwise vaccines UptoDate Colon cancer screening:

## 2010-12-29 NOTE — Assessment & Plan Note (Signed)
Worened again since stopped exercising and porr diet.. Will get back on track. Recheck at next follow up.

## 2011-01-13 ENCOUNTER — Encounter: Payer: Self-pay | Admitting: *Deleted

## 2011-01-13 ENCOUNTER — Other Ambulatory Visit: Payer: Medicare Other

## 2011-01-13 ENCOUNTER — Other Ambulatory Visit: Payer: Self-pay | Admitting: Family Medicine

## 2011-01-13 DIAGNOSIS — Z1212 Encounter for screening for malignant neoplasm of rectum: Secondary | ICD-10-CM

## 2011-01-13 LAB — FECAL OCCULT BLOOD, IMMUNOCHEMICAL: Fecal Occult Bld: NEGATIVE

## 2011-04-21 ENCOUNTER — Telehealth: Payer: Self-pay | Admitting: Internal Medicine

## 2011-04-21 NOTE — Telephone Encounter (Signed)
Patient called and stated that on Tuesday Christmas day he was getting dresses and got extremely dizzy and fell and he couldn't do anything for about 10 minutes.  As it eased off he started sweating profusely and he felt nausea and for a while he felt like he was on the outside looking in.  I made an appt. For him on Monday 04/25/11 at 2:45.  Please advise if you would like to see him sooner.  He did say he is gradually getting better.

## 2011-04-21 NOTE — Telephone Encounter (Signed)
Yes he needs to be seen sooner...any availablity with other providers today or tommorow?

## 2011-04-21 NOTE — Telephone Encounter (Signed)
Patient advised and appt made with dr.aron tomorrow at 2

## 2011-04-22 ENCOUNTER — Encounter: Payer: Self-pay | Admitting: Family Medicine

## 2011-04-22 ENCOUNTER — Ambulatory Visit (INDEPENDENT_AMBULATORY_CARE_PROVIDER_SITE_OTHER): Payer: Medicare Other | Admitting: Family Medicine

## 2011-04-22 VITALS — BP 142/90 | HR 74 | Temp 97.9°F | Wt 159.2 lb

## 2011-04-22 DIAGNOSIS — R42 Dizziness and giddiness: Secondary | ICD-10-CM

## 2011-04-22 DIAGNOSIS — R9431 Abnormal electrocardiogram [ECG] [EKG]: Secondary | ICD-10-CM

## 2011-04-22 DIAGNOSIS — H6120 Impacted cerumen, unspecified ear: Secondary | ICD-10-CM | POA: Insufficient documentation

## 2011-04-22 DIAGNOSIS — E039 Hypothyroidism, unspecified: Secondary | ICD-10-CM

## 2011-04-22 DIAGNOSIS — Z136 Encounter for screening for cardiovascular disorders: Secondary | ICD-10-CM

## 2011-04-22 NOTE — Progress Notes (Signed)
Subjective:    Patient ID: [name], male    DOB: 1940/12/12, 70 y.o.   MRN: [id number]  HPI  70 yo pt of Dr. Ermalene Searing with h/o HTN,  HLD, hypothyroidism, PVD, COPD  here for acute episode of dizziness/presycope.  Christmas morning ( 3 days ago), was walking around bedroom, stood next to the bed and put his shirt on and felt an immediate sense of the room spinning. So severe he "fell into the bed." But he did not lose consciousness.  Sat on bed and felt like he could not even lift his arms.  Became very diaphoretic.  Wife walked in and stated he was soaked but felt cold, then vomited 3 times.  After he vomited, felt like he could move his extremities again, went to sleep for four hours.  When he woke up, had residual HA, otherwise all symptoms resolved. Never had any CP. No SOB.  No diarrhea.  No fever. No h/o CAD. No family h/o CAD. He is smoker. Was given two ASA.    Has had no recurrent symptoms but still fatigued.  Sees Vascular MD each fall for past stenting in legs.. Structural not true PVD, just saw him on Friday.  Hypothyroid: TSH elevated in August, appears, he did not come in to have it rechecked. Lab Results  Component Value Date   TSH 14.61* 12/22/2010   Had cardiac work up, Dr. Shirlee Latch in 09/2009- Probable apical thinning with no evidence for ischemia or infarction. EF mildly decreased with global hypokinesis.    Patient Active Problem List  Diagnoses  . HYPOTHYROIDISM NOS  . HYPERCHOLESTEROLEMIA  . TOBACCO ABUSE  . ESSENTIAL HYPERTENSION, BENIGN  . ABDOMINAL AORTIC ANEURYSM  . UNSPECIFIED PERIPHERAL VASCULAR DISEASE  . BRONCHITIS, OBSTRUCTIVE CHRONIC  . CHRONIC OBSTRUCTIVE PULMONARY DISEASE, MILD  . FATIGUE  . PROSTATE SPECIFIC ANTIGEN, ELEVATED  . BPH (benign prostatic hyperplasia)   Past Medical History  Diagnosis Date  . Thyroid disease     hypo  . Venous insufficiency   . Peripheral arterial disease   . Tobacco abuse   . COPD (chronic obstructive  pulmonary disease)   . BPH (benign prostatic hyperplasia)   . Elevated PSA   . Arthritis    Past Surgical History  Procedure Date  . Cholecystectomy   . Angioplasty / stenting femoral    History  Substance Use Topics  . Smoking status: Current Everyday Smoker  . Smokeless tobacco: Not on file  . Alcohol Use: No   Family History  Problem Relation Age of Onset  . Alcohol abuse Father   . Cirrhosis Father    No Known Allergies Current Outpatient Prescriptions on File Prior to Visit  Medication Sig Dispense Refill  . aspirin 81 MG tablet Take 81 mg by mouth daily.        Marland Kitchen levothyroxine (SYNTHROID, LEVOTHROID) 200 MCG tablet One tablet daily, take in addition to 200 mcg daily  30 tablet  11  . levothyroxine (SYNTHROID, LEVOTHROID) 25 MCG tablet One tablet daily, take in addition to 200 mcg daily  30 tablet  11   The PMH, PSH, Social History, Family History, Medications, and allergies have been reviewed in [facility], and have been updated if relevant.  Review of Systems  Constitutional: Positive for fatigue. Negative for fever and unexpected weight change.  HENT: Negative for ear pain, congestion, sore throat, rhinorrhea, trouble swallowing and postnasal drip.   Eyes: Negative for pain.  Respiratory: Negative for cough, shortness of breath and wheezing.  Cardiovascular: Negative for chest pain, palpitations and leg swelling.  Gastrointestinal: Negative for nausea, abdominal pain, diarrhea, constipation and blood in stool.  Genitourinary: Negative for dysuria, urgency, hematuria, discharge, penile swelling, scrotal swelling, difficulty urinating, penile pain and testicular pain.  Skin: Negative for rash.  Neurological: Negative for syncope, weakness, light-headedness, numbness and headaches.  Psychiatric/Behavioral: Negative for behavioral problems and dysphoric mood. The patient is not nervous/anxious.        Objective:   Physical Exam  BP 142/90  Pulse 74  Temp(Src) 97.9 F  (36.6 C) (Oral)  Wt 159 lb 4 oz (72.235 kg)  Constitutional: He appears well-developed and well-nourished.  Non-toxic appearance. He does not appear ill. No distress.  HENT:  Head: Normocephalic and atraumatic.  Right Ear: Hearing, tympanic membrane, external ear and ear canal normal.  Left Ear: pos cerumen impaction Nose: Nose normal.  Mouth/Throat: Uvula is midline, oropharynx is clear and moist and mucous membranes are normal.  Eyes: Conjunctivae, EOM and lids are normal. Pupils are equal, round, and reactive to light. No foreign bodies found.  Neck: Trachea normal, normal range of motion and phonation normal. Neck supple. Carotid bruit is not present. No mass and no thyromegaly present.  Cardiovascular: Normal rate, regular rhythm, S1 normal, S2 normal, intact distal pulses and normal pulses.  Exam reveals no gallop.   No murmur heard. Pulmonary/Chest: Breath sounds normal. He has no wheezes. He has no rhonchi. He has no rales. .  Neurological: He is alert. He has normal strength and normal reflexes. No cranial nerve deficit or sensory deficit. Gait normal.  Skin: Skin is warm, dry and intact. No rash noted.  Psychiatric: He has a normal mood and affect. His speech is normal and behavior is normal. Judgment normal.     Assessment & Plan:   1. Dizziness  New and resolved with unclear etiology. Although he had no CP, symptoms are somewhat concerning for cardiac etiology. Will check labs today to rule out other possible causes. Continue ASA daily. Advised to go to ER over the weekend if symptoms return. EKG 12-Lead, Ambulatory referral to Cardiology, CBC w/Diff, Basic Metabolic Panel (BMET)  2. EKG abnormalities  EKG showing some inverted T waves in high risk patient, will refer back to cardiology for further evaluation/ repeat stress test. Ambulatory referral to Cardiology  3. Cerumen impaction  Ceruminosis is noted.  Wax is removed by syringing and manual debridement. Instructions  for home care to prevent wax buildup are given.    4. HYPOTHYROIDISM NOS  See above.   TSH, T4, free

## 2011-04-22 NOTE — Patient Instructions (Signed)
Nice to meet you. We will call you with your blood work as soon as we have the results. Please stop by to see Shirlee Limerick on your way out to set up your cardiology appointment after you go to the lab. Please go straight to the ER if symptoms return over the weekend.

## 2011-04-23 LAB — BASIC METABOLIC PANEL
Calcium: 9.6 mg/dL (ref 8.4–10.5)
Chloride: 104 mEq/L (ref 96–112)
Creat: 1.02 mg/dL (ref 0.50–1.35)
Sodium: 140 mEq/L (ref 135–145)

## 2011-04-23 LAB — CBC WITH DIFFERENTIAL/PLATELET
Eosinophils Absolute: 0.2 10*3/uL (ref 0.0–0.7)
Eosinophils Relative: 3 % (ref 0–5)
HCT: 48.8 % (ref 39.0–52.0)
Lymphocytes Relative: 32 % (ref 12–46)
Lymphs Abs: 2.2 10*3/uL (ref 0.7–4.0)
MCH: 29 pg (ref 26.0–34.0)
MCV: 89.1 fL (ref 78.0–100.0)
Monocytes Absolute: 0.7 10*3/uL (ref 0.1–1.0)
Monocytes Relative: 10 % (ref 3–12)
Platelets: 234 10*3/uL (ref 150–400)
RBC: 5.48 MIL/uL (ref 4.22–5.81)

## 2011-04-23 LAB — T4, FREE: Free T4: 2.1 ng/dL — ABNORMAL HIGH (ref 0.80–1.80)

## 2011-04-25 ENCOUNTER — Ambulatory Visit: Payer: Medicare Other | Admitting: Cardiovascular Disease

## 2011-04-25 ENCOUNTER — Ambulatory Visit: Payer: Medicare Other | Admitting: Family Medicine

## 2011-04-25 ENCOUNTER — Encounter: Payer: Self-pay | Admitting: Cardiovascular Disease

## 2011-04-25 ENCOUNTER — Ambulatory Visit (INDEPENDENT_AMBULATORY_CARE_PROVIDER_SITE_OTHER): Payer: Medicare Other | Admitting: Cardiovascular Disease

## 2011-04-25 DIAGNOSIS — R0989 Other specified symptoms and signs involving the circulatory and respiratory systems: Secondary | ICD-10-CM

## 2011-04-25 DIAGNOSIS — R42 Dizziness and giddiness: Secondary | ICD-10-CM | POA: Insufficient documentation

## 2011-04-25 DIAGNOSIS — I10 Essential (primary) hypertension: Secondary | ICD-10-CM

## 2011-04-25 DIAGNOSIS — E7800 Pure hypercholesterolemia, unspecified: Secondary | ICD-10-CM

## 2011-04-25 DIAGNOSIS — F17200 Nicotine dependence, unspecified, uncomplicated: Secondary | ICD-10-CM

## 2011-04-25 DIAGNOSIS — R9431 Abnormal electrocardiogram [ECG] [EKG]: Secondary | ICD-10-CM

## 2011-04-25 DIAGNOSIS — I771 Stricture of artery: Secondary | ICD-10-CM | POA: Insufficient documentation

## 2011-04-25 DIAGNOSIS — J449 Chronic obstructive pulmonary disease, unspecified: Secondary | ICD-10-CM

## 2011-04-25 MED ORDER — PRAVASTATIN SODIUM 40 MG PO TABS: 40.0000 mg | ORAL_TABLET | Freq: Every day | ORAL | Status: DC

## 2011-04-25 NOTE — Assessment & Plan Note (Signed)
Exam suggests bilateral subclavian stenoses. He does have a decreased pulse on the left with blood pressure gradient of more than 20 mm of mercury. Certainly on clinical exam has a decreased radial pulse on the left as well as brachial. We'll try to visualize the subclavian artery on carotid ultrasound.

## 2011-04-25 NOTE — Assessment & Plan Note (Signed)
Previous PFTs documenting COPD. He denies any shortness of breath at this time. He may benefit from inhalers in the future for shortness of breath though we should certainly not exclude coronary artery disease as a cause of his shortness of breath in this develops.

## 2011-04-25 NOTE — Assessment & Plan Note (Signed)
Blood pressure is mildly elevated on the right, depressed on the left secondary to peripheral vascular disease. We'll continue to monitor the blood pressure for now.

## 2011-04-25 NOTE — Assessment & Plan Note (Signed)
Given the peripheral vascular disease in his legs, carotid, subclavian and an EKG changes, he is very high risk. We have suggested he needs a goal total cholesterol less than 150, LDL less than 70. We'll start on pravastatin 40 mg daily and titrate upwards to stronger medications as needed. We could check his cholesterol is 3 months time.

## 2011-04-25 NOTE — Assessment & Plan Note (Signed)
We have recommended that given his likely peripheral vascular disease and high risk of coronary artery disease that he stop smoking.  He will continue to work on this and does not want any assistance with chantix.

## 2011-04-25 NOTE — Assessment & Plan Note (Signed)
He has a carotid bruit bilaterally though worse on the left. We have ordered a carotid ultrasound.

## 2011-04-25 NOTE — Patient Instructions (Addendum)
You are doing well. Please start pravastatin one a day at night (for cholesterol) We will schedule a carotid ultrasound for bruit on the left and low pulses in the left arm  Please call us if you have new issues that need to be addressed before your next appt.  Your physician wants you to follow-up in: 6 months.  You will receive a reminder letter in the mail two months in advance. If you don't receive a letter, please call our office to schedule the follow-up appointment.

## 2011-04-25 NOTE — Assessment & Plan Note (Signed)
Recent episode of dizziness on Christmas Day is likely benign positional vertigo. There was clearly a positional component to his symptoms. I suggested if he starts to have recurrent symptoms, but he contact our office and also contact Dr. Ermalene Searing for possible referral to ear nose throat.

## 2011-04-25 NOTE — Progress Notes (Signed)
Patient ID: [name], male    DOB: March 12, 1941, 70 y.o.   MRN: [id number]  HPI Comments: [name] is a pleasant 70 year old gentleman with a history of hyperlipidemia, long smoking history for 60 years, stent to his left lower extremity for claudication followed by Dr. Evette Cristal, also with a vein ablation in the past who continues to smoke one to 2 cigarettes per day after smoking more than one pack per day for most of his life, who presents by referral by referral from Dr. Dayton Martes for recent episodes of dizziness, found to have EKG changes.  He reports that he had a significant COPD exacerbation in November of this year. It took a long time to recover. He is not currently on any inhalers. He had any episodes of acute onset of dizziness that he described as a spinning. It happened on Christmas Day while he was getting dressed. Symptoms were severe and he fell on the bed. He was able to stabilize his symptoms by keeping his head down and sitting or when he lifted his head, he had recurrent spinning. Symptoms seem to resolve after more than 10 minutes. He did have nausea, vomiting, sweating during the episode.   He denies any chest pain, shortness of breath and is otherwise active. He feels that his breathing is back to baseline following recent bronchitis/COPD exacerbation.  EKG shows normal sinus rhythm with a rate 86 beats per minute with ST and T wave abnormality in leads V4 through V6, Leads 2, 3, aVF    Outpatient Encounter Prescriptions as of 04/25/2011  Medication Sig Dispense Refill  . aspirin 81 MG tablet Take 81 mg by mouth daily.        Marland Kitchen levothyroxine (SYNTHROID, LEVOTHROID) 200 MCG tablet One tablet daily, take in addition to 200 mcg daily  30 tablet  11  . DISCONTD: levothyroxine (SYNTHROID, LEVOTHROID) 25 MCG tablet One tablet daily, take in addition to 200 mcg daily  30 tablet  11    Review of Systems  Constitutional: Negative.   HENT: Negative.   Eyes: Negative.   Respiratory:  Positive for cough and shortness of breath.   Cardiovascular: Negative.   Gastrointestinal: Negative.   Musculoskeletal: Negative.   Skin: Negative.   Neurological: Positive for dizziness.  Hematological: Negative.   Psychiatric/Behavioral: Negative.   All other systems reviewed and are negative.   Social Hx: He has smoked most of his life for 60 years, currently smoking several cigarettes per day. He reports that he does not drink alcohol or use illicit drugs.  Family history Alcohol abuse in his father and Cirrhosis in his father.   BP 148/74  Pulse 86  Ht 5\' 9"  (1.753 m)  Wt 158 lb 1.9 oz (71.723 kg)  BMI 23.35 kg/m2 Blood pressure on the left arm is 115/80, repeat blood pressure on arrival is 140/75  Physical Exam  Nursing note and vitals reviewed. Constitutional: He is oriented to person, place, and time. He appears well-developed and well-nourished.  HENT:  Head: Normocephalic.  Nose: Nose normal.  Mouth/Throat: Oropharynx is clear and moist.  Eyes: Conjunctivae are normal. Pupils are equal, round, and reactive to light.  Neck: Normal range of motion. Neck supple. No JVD present. Carotid bruit is present.  Cardiovascular: Normal rate, regular rhythm, S1 normal, S2 normal, normal heart sounds and intact distal pulses.  Exam reveals no gallop and no friction rub.   No murmur heard. Pulses:      Carotid pulses are 2+ on the  right side, and 2+ on the left side.      Radial pulses are 2+ on the right side, and 1+ on the left side.       Femoral pulses are 1+ on the right side, and 1+ on the left side.      Dorsalis pedis pulses are 2+ on the right side, and 0 on the left side.       Posterior tibial pulses are 2+ on the right side, and 0 on the left side.  Pulmonary/Chest: Effort normal and breath sounds normal. No respiratory distress. He has no wheezes. He has no rales. He exhibits no tenderness.  Abdominal: Soft. Bowel sounds are normal. He exhibits no distension. There  is no tenderness.  Musculoskeletal: Normal range of motion. He exhibits no edema and no tenderness.  Lymphadenopathy:    He has no cervical adenopathy.  Neurological: He is alert and oriented to person, place, and time. Coordination normal.  Skin: Skin is warm and dry. No rash noted. No erythema.  Psychiatric: He has a normal mood and affect. His behavior is normal. Judgment and thought content normal.           Assessment and Plan

## 2011-04-25 NOTE — Assessment & Plan Note (Signed)
He does have new EKG changes compared to June of 2011 with ST and T-wave abnormalities in anterolateral and inferior leads. He is very high risk of coronary artery disease. Negative stress test in 2011. He is not particularly interested in a repeat stress test at this time as he does not have any significant symptoms. We have suggested to him that he contact our office for any chest pain or shortness of breath or increasing fatigue.

## 2011-04-27 ENCOUNTER — Ambulatory Visit: Payer: Medicare Other | Admitting: Family Medicine

## 2011-05-17 ENCOUNTER — Encounter (INDEPENDENT_AMBULATORY_CARE_PROVIDER_SITE_OTHER): Payer: Medicare Other | Admitting: *Deleted

## 2011-05-17 DIAGNOSIS — I6529 Occlusion and stenosis of unspecified carotid artery: Secondary | ICD-10-CM

## 2011-05-17 DIAGNOSIS — G458 Other transient cerebral ischemic attacks and related syndromes: Secondary | ICD-10-CM | POA: Diagnosis not present

## 2011-05-17 DIAGNOSIS — R0989 Other specified symptoms and signs involving the circulatory and respiratory systems: Secondary | ICD-10-CM | POA: Diagnosis not present

## 2011-05-23 ENCOUNTER — Ambulatory Visit (INDEPENDENT_AMBULATORY_CARE_PROVIDER_SITE_OTHER): Payer: Medicare Other | Admitting: Family Medicine

## 2011-05-23 ENCOUNTER — Telehealth: Payer: Self-pay | Admitting: Family Medicine

## 2011-05-23 ENCOUNTER — Encounter: Payer: Self-pay | Admitting: Family Medicine

## 2011-05-23 ENCOUNTER — Ambulatory Visit (INDEPENDENT_AMBULATORY_CARE_PROVIDER_SITE_OTHER)
Admission: RE | Admit: 2011-05-23 | Discharge: 2011-05-23 | Disposition: A | Discharge status: Home / Self Care | Payer: Medicare Other | Source: Ambulatory Visit | Attending: Family Medicine | Admitting: Family Medicine

## 2011-05-23 VITALS — BP 150/82 | HR 91 | Temp 98.1°F | Ht 69.0 in | Wt 162.4 lb

## 2011-05-23 DIAGNOSIS — R918 Other nonspecific abnormal finding of lung field: Secondary | ICD-10-CM | POA: Diagnosis not present

## 2011-05-23 DIAGNOSIS — E039 Hypothyroidism, unspecified: Secondary | ICD-10-CM | POA: Diagnosis not present

## 2011-05-23 DIAGNOSIS — J90 Pleural effusion, not elsewhere classified: Secondary | ICD-10-CM | POA: Diagnosis not present

## 2011-05-23 DIAGNOSIS — R0602 Shortness of breath: Secondary | ICD-10-CM

## 2011-05-23 DIAGNOSIS — J449 Chronic obstructive pulmonary disease, unspecified: Secondary | ICD-10-CM

## 2011-05-23 DIAGNOSIS — J4489 Other specified chronic obstructive pulmonary disease: Secondary | ICD-10-CM

## 2011-05-23 MED ORDER — ALBUTEROL SULFATE HFA 108 (90 BASE) MCG/ACT IN AERS: 2.0000 | INHALATION_SPRAY | Freq: Four times a day (QID) | RESPIRATORY_TRACT | Status: DC | PRN

## 2011-05-23 MED ORDER — MOXIFLOXACIN HCL 400 MG PO TABS: 400.0000 mg | ORAL_TABLET | Freq: Every day | ORAL | Status: AC

## 2011-05-23 MED ORDER — TIOTROPIUM BROMIDE MONOHYDRATE 18 MCG IN CAPS: 18.0000 ug | ORAL_CAPSULE | Freq: Every day | RESPIRATORY_TRACT | Status: DC

## 2011-05-23 NOTE — Telephone Encounter (Signed)
See result note.  Start antibiotics. Will check CXR for resolution at next OV in 3-4 weeks with Dr. Patsy Lager.

## 2011-05-23 NOTE — Progress Notes (Signed)
Subjective:    Patient ID: [name], male    DOB: 1940/05/17, 71 y.o.   MRN: [id number]  HPI [name] is a pleasant 71 year old gentleman with a history of hyperlipidemia, long smoking history for 60 years, stent to his left lower extremity for claudication followed by Dr. Evette Cristal, also with a vein ablation in the past who continues to smoke one to 2 cigarettes per day after smoking more than one pack per day for most of his life.  He reports that he had a significant COPD exacerbation in November of this year. It took a long time to recover. He is not currently on any inhalers.  He had any episodes of acute onset of dizziness that he described as a spinning. It happened on Christmas Day while he was getting dressed. Symptoms were severe and he fell on the bed. He was able to stabilize his symptoms by keeping his head down and sitting or when he lifted his head, he had recurrent spinning. Symptoms seem to resolve after more than 10 minutes. He did have nausea, vomiting, sweating during the episode.  He denies any chest pain and is otherwise active. He feels that his breathing is back to baseline following recent bronchitis/COPD exacerbation.   Saw Dr. Dayton Martes for above symptoms... Referred to Dr. Mariah Milling for EKG changes. Recommendations are below: EKG abnormalities - Julien Nordmann, MD 04/25/2011 6:19 PM Signed  He does have new EKG changes compared to June of 2011 with ST and T-wave abnormalities in anterolateral and inferior leads. He is very high risk of coronary artery disease. Negative stress test in 2011. He is not particularly interested in a repeat stress test at this time as he does not have any significant symptoms. We have suggested to him that he contact our office for any chest pain or shortness of breath or increasing fatigue.  Dizziness - Julien Nordmann, MD 04/25/2011 6:20 PM Signed  Recent episode of dizziness on Christmas Day is likely benign positional vertigo. There was clearly a  positional component to his symptoms. I suggested if he starts to have recurrent symptoms, but he contact our office and also contact Dr. Ermalene Searing for possible referral to ear nose throat.  Carotid bruit - Julien Nordmann, MD 04/25/2011 6:21 PM Signed  He has a carotid bruit bilaterally though worse on the left.   Subclavian arterial stenosis - Julien Nordmann, MD 04/25/2011 6:23 PM Signed  Exam suggests bilateral subclavian stenoses. He does have a decreased pulse on the left with blood pressure gradient of more than 20 mm of mercury. Certainly on clinical exam has a decreased radial pulse on the left as well as brachial. Visualize the subclavian artery on carotid ultrasound.  ESSENTIAL HYPERTENSION, BENIGN - Julien Nordmann, MD 04/25/2011 6:26 PM Signed  Blood pressure is mildly elevated on the right, depressed on the left secondary to peripheral vascular disease. We'll continue to monitor the blood pressure for now.    Today the pt comes to clinic with his wife who reports he has been minimizing his symptoms of shortness of breath. He states he has SOB with any physical exertion, also some acute episodes of SOB. Last 15 minutes, gasping for breath. He states it has been ongoing for months, maybe even longer, gradually worsening. Also some fatigue.  Nochest pain. No further episodes of dizziness, vertigo. Occ mild dry cough.  Carotid ultrasound showed 50% but no surgical indication. Also subclavian proximal disease  causing some BP difference  In left side. No numbness, no  weakness. Sees Vascular MD in fall, Dr. Evette Cristal.  Thyroid free T4 was a little high.. In 03/2012. He changed to 200 mcg in last 2 weeks. CBC was normal.   60 pack year history of smoking!   Review of Systems  Constitutional: Positive for fatigue. Negative for fever.  HENT: Negative for ear pain.   Respiratory: Positive for cough and shortness of breath. Negative for chest tightness.   Cardiovascular: Negative for  chest pain, palpitations and leg swelling.  Gastrointestinal: Negative for diarrhea, constipation and abdominal distention.  Skin: Negative for rash.       Objective:   Physical Exam  Constitutional: Vital signs are normal. He appears well-developed and well-nourished.  HENT:  Head: Normocephalic.  Right Ear: Hearing normal.  Left Ear: Hearing normal.  Nose: Nose normal.  Mouth/Throat: Oropharynx is clear and moist and mucous membranes are normal.  Neck: Trachea normal. Carotid bruit is present. No mass and no thyromegaly present.  Cardiovascular: Normal rate, regular rhythm and normal pulses.  Exam reveals no gallop, no distant heart sounds and no friction rub.   No murmur heard.      No peripheral edema   Pulmonary/Chest: Effort normal. No respiratory distress. He has decreased breath sounds. He has no wheezes. He has no rhonchi. He has no rales.  Skin: Skin is warm, dry and intact. No rash noted.  Psychiatric: He has a normal mood and affect. His speech is normal and behavior is normal. Thought content normal.          Assessment & Plan:

## 2011-05-23 NOTE — Patient Instructions (Addendum)
Change thyorid med to 200 mcg daily, adding additional dose every other day. Return in 4 weeks for repeat TSH and Free T4 and fre e T3. Stop smoking!  We will call with X-ray results.  Start Spiriva daily, take every day as regular medicine.  If episodes of shortness of breath you can use proair (albuterol) for symptoms.  Can cause jitteriness.  Follow up in 1 month for COPD re-eval with Dr. Patsy Lager.

## 2011-05-23 NOTE — Assessment & Plan Note (Signed)
Likely decreasing down to 200 mcg as pt has done on his own is too aggressive a changes. Given free T4 elevated last check, will have him continue 200 mcg daily but take additional 25 mcg every other day. Return in 4 weeks for repeat TSH, free t4.

## 2011-05-23 NOTE — Assessment & Plan Note (Signed)
Likely cause of SOB, worse lately but ongoing for some while. Spirometry is worse in last 1-2 years.  Pt continues to smoke.Marland Kitchen Precontemplative. Not interested in chantix at this time. Start spiriva, use albuterol prn, likely will need additional treatment such as advair. Recheck in 1 month for symptom control as well as repeat spirometry.

## 2011-05-24 NOTE — Telephone Encounter (Signed)
Patient advised.

## 2011-05-25 ENCOUNTER — Other Ambulatory Visit: Payer: Self-pay | Admitting: Cardiovascular Disease

## 2011-05-25 DIAGNOSIS — R0989 Other specified symptoms and signs involving the circulatory and respiratory systems: Secondary | ICD-10-CM

## 2011-05-25 DIAGNOSIS — I6529 Occlusion and stenosis of unspecified carotid artery: Secondary | ICD-10-CM

## 2011-05-25 DIAGNOSIS — I771 Stricture of artery: Secondary | ICD-10-CM

## 2011-06-01 ENCOUNTER — Other Ambulatory Visit: Payer: Self-pay | Admitting: *Deleted

## 2011-06-01 MED ORDER — LEVOTHYROXINE SODIUM 200 MCG PO TABS: ORAL_TABLET | ORAL | Status: DC

## 2011-06-06 ENCOUNTER — Encounter: Payer: Self-pay | Admitting: Family Medicine

## 2011-06-06 ENCOUNTER — Ambulatory Visit (INDEPENDENT_AMBULATORY_CARE_PROVIDER_SITE_OTHER): Payer: Medicare Other | Admitting: Family Medicine

## 2011-06-06 DIAGNOSIS — J159 Unspecified bacterial pneumonia: Secondary | ICD-10-CM

## 2011-06-06 DIAGNOSIS — J441 Chronic obstructive pulmonary disease with (acute) exacerbation: Secondary | ICD-10-CM

## 2011-06-06 MED ORDER — LEVOFLOXACIN 500 MG PO TABS: 500.0000 mg | ORAL_TABLET | Freq: Every day | ORAL | Status: AC

## 2011-06-06 MED ORDER — PREDNISONE 20 MG PO TABS: 40.0000 mg | ORAL_TABLET | Freq: Every day | ORAL | Status: DC

## 2011-06-06 NOTE — Progress Notes (Signed)
  Patient Name: [name] Date of Birth: Oct 02, 1940 Age: 71 y.o. Medical Record Number: [id number] Gender: male Date of Encounter: 06/06/2011  History of Present Illness:  [name] is a 71 y.o. very pleasant male patient who presents with the following:  F/u severe COPD dx by spirometry, > 60 year pack history with continued SOB after avelox x 7 days. Improved when starting ABX. No improvement with inhalers -- only did spiriva for 2 days.   Started to have some severe shortness of breath after christmas. Has been ongoing with it for the last few weeks. Diagnosed with pneumonia, and took 7 days of Avelox. Ran out of ABX and then went down and feeling really bad.   Smoked for 60 years. Notable SOB currently  Past Medical History, Surgical History, Social History, Family History, Problem List, Medications, and Allergies have been reviewed and updated if relevant.  Review of Systems: ROS: GEN: Acute illness details above GI: Tolerating PO intake GU: maintaining adequate hydration and urination Pulm: No SOB Interactive and getting along well at home.  Otherwise, ROS is as per the HPI.   Physical Examination: Filed Vitals:   06/06/11 1004  BP: 140/80  Pulse: 106  Temp: 97.5 F (36.4 C)  TempSrc: Oral  Height: 5\' 9"  (1.753 m)  Weight: 165 lb (74.844 kg)  SpO2: 98%    Body mass index is 24.37 kg/(m^2).   GEN: WDWN, NAD, Non-toxic, A & O x 3 HEENT: Atraumatic, Normocephalic. Neck supple. No masses, No LAD. Ears and Nose: No external deformity. CV: RRR, No M/G/R. No JVD. No thrill. No extra heart sounds. PULM: scattered rhonchi B L lobes. Mild wheezing.  EXTR: diffuse clubbing NEURO Normal gait.  PSYCH: Normally interactive. Conversant. Not depressed or anxious appearing.  Calm demeanor.    Assessment and Plan: 1. Pneumonia, bacterial  levofloxacin (LEVAQUIN) 500 MG tablet, predniSONE (DELTASONE) 20 MG tablet  2. COPD exacerbation  levofloxacin (LEVAQUIN) 500 MG  tablet, predniSONE (DELTASONE) 20 MG tablet    14 D of ABX 7 days of steroids  Acute pna with poorly controlled COPD. Get over flare, restart spiriva and recheck in 4-6 weeks.

## 2011-06-08 ENCOUNTER — Other Ambulatory Visit: Payer: Self-pay | Admitting: *Deleted

## 2011-06-08 MED ORDER — LEVOTHYROXINE SODIUM 200 MCG PO TABS: ORAL_TABLET | ORAL | Status: DC

## 2011-06-16 ENCOUNTER — Ambulatory Visit: Payer: Medicare Other | Admitting: Family Medicine

## 2011-06-27 ENCOUNTER — Telehealth: Payer: Self-pay | Admitting: Family Medicine

## 2011-06-27 NOTE — Telephone Encounter (Signed)
Make sure pt has f/u

## 2011-06-27 NOTE — Telephone Encounter (Signed)
Triage Record Num: [id number] Operator: Aundra Millet Patient Name: [name] Call Date & Time: 06/27/2011 2:37:41PM Patient Phone: [phone] PCP: Kerby Nora Patient Gender: Male PCP Fax : [phone] Patient DOB: [birth year] Practice Name: [facility] Reason for Call: Caller: [name]/Mother; PCP: Excell Seltzer.; CB#: [phone]; ; ; Call regarding Leg/ Ankle Swelling; Pt has had recent dx of pneumonia with new dx of COPD 04/2011 and taking Spiriva . Has taken Ventolin at times, but cant tell that it helps with SOB. Increased SOB with minimal exertion. Has to pant to breathe at times. Since 06/23/2011 ankle and legs have also been swelling - has had ankle swelling interm in the past but would go down. Pt states he is SOB but not a struggle to talk. RN adv to hang up and dial 911 for worsening breathing problems and known COPD and NOT responding to treatment per Breathing problems. Pt stated he doesnt know that he will call 911 and wll speak to his wife. RN notified Benny Lennert CMA at office about pts sx;s and 911 DISP Protocol(s) Used: Breathing Problems Recommended Outcome per Protocol: Activate EMS 911 Reason for Outcome: Worsening breathing problems AND known cardiac or respiratory condition (coronary artery disease, angina, heart failure, asthma, chronic bronchitis, COPD) NOT responding to treatment OR treatment not available. Care Advice: ~ Do not give the patient anything to eat or drink. ~ Continue to follow treatment plan, including medications, until evaluated by provider. ~ IMMEDIATE ACTION 06/27/2011 3:16:45PM Page 1 of 1 [email or web address]

## 2011-06-27 NOTE — Telephone Encounter (Signed)
Patient has follow up on march 20

## 2011-06-28 ENCOUNTER — Telehealth: Payer: Self-pay | Admitting: Family Medicine

## 2011-06-28 NOTE — Telephone Encounter (Signed)
What do I do with this patient ?

## 2011-06-28 NOTE — Telephone Encounter (Signed)
Reviewed triage note from yesterday as well. He should be seen in office today or tommorow if able. He has had recent dx of copd and pneumonia. Peripheral swelling is new issue as far as I am aware. Last OV note in Feb when saw copland ... He was not taking spiriva. I am not sure if he restarted this(he should)... He may need to add advair but we need to make sure acute issue not goung on with OV. MD seeing him can also consider pulm referral. He has recently seen [facility].. Felt SOB from pulm source.

## 2011-06-28 NOTE — Telephone Encounter (Signed)
Spoke with patients wife and they are coming in tomorrow for appt with dr copland

## 2011-06-28 NOTE — Telephone Encounter (Signed)
Agree, have him f/u in office

## 2011-06-28 NOTE — Telephone Encounter (Signed)
To: [facility] (Daytime Triage) Fax: [phone] From: [facility] Date/ Time: 06/27/2011 4:46 PM Taken By: Cecille Po, CSR Caller: [name] Facility: not collected Patient: [name], [name] DOB: [birth year] Phone: [phone] Reason for Call: [name] is calling in regards to [name]. She states that she spoke with a triage nurse today who advised them to call 911 over [name]'s feet and ankle swelling and his trouble breathing. Maragret said that her husband hung-up before she could ask the nurse more information as to why they needed to be seen at the ED. She requested this message get to [facility] since she is more familiar with [name]. Please call her back for further assistance. Thank-you. Regarding Appointment: Appt Date: Appt Time: Unknown Provider: Reason: Details: confidential Outcome:

## 2011-06-29 ENCOUNTER — Encounter: Payer: Self-pay | Admitting: Family Medicine

## 2011-06-29 ENCOUNTER — Ambulatory Visit (INDEPENDENT_AMBULATORY_CARE_PROVIDER_SITE_OTHER): Payer: Medicare Other | Admitting: Family Medicine

## 2011-06-29 ENCOUNTER — Ambulatory Visit (INDEPENDENT_AMBULATORY_CARE_PROVIDER_SITE_OTHER)
Admission: RE | Admit: 2011-06-29 | Discharge: 2011-06-29 | Disposition: A | Discharge status: Home / Self Care | Payer: Medicare Other | Source: Ambulatory Visit | Attending: Family Medicine | Admitting: Family Medicine

## 2011-06-29 VITALS — BP 130/78 | HR 101 | Temp 97.4°F | Ht 69.0 in | Wt 169.8 lb

## 2011-06-29 DIAGNOSIS — R0602 Shortness of breath: Secondary | ICD-10-CM | POA: Diagnosis not present

## 2011-06-29 DIAGNOSIS — R55 Syncope and collapse: Secondary | ICD-10-CM

## 2011-06-29 DIAGNOSIS — R222 Localized swelling, mass and lump, trunk: Secondary | ICD-10-CM

## 2011-06-29 DIAGNOSIS — R918 Other nonspecific abnormal finding of lung field: Secondary | ICD-10-CM

## 2011-06-29 DIAGNOSIS — J449 Chronic obstructive pulmonary disease, unspecified: Secondary | ICD-10-CM

## 2011-06-29 DIAGNOSIS — I5021 Acute systolic (congestive) heart failure: Secondary | ICD-10-CM

## 2011-06-29 DIAGNOSIS — R609 Edema, unspecified: Secondary | ICD-10-CM

## 2011-06-29 DIAGNOSIS — J90 Pleural effusion, not elsewhere classified: Secondary | ICD-10-CM | POA: Diagnosis not present

## 2011-06-29 DIAGNOSIS — R9389 Abnormal findings on diagnostic imaging of other specified body structures: Secondary | ICD-10-CM

## 2011-06-29 DIAGNOSIS — J984 Other disorders of lung: Secondary | ICD-10-CM | POA: Diagnosis not present

## 2011-06-29 LAB — HEPATIC FUNCTION PANEL
ALT: 17 U/L (ref 0–53)
AST: 19 U/L (ref 0–37)
Albumin: 3.7 g/dL (ref 3.5–5.2)
Total Bilirubin: 0.9 mg/dL (ref 0.3–1.2)
Total Protein: 6.4 g/dL (ref 6.0–8.3)

## 2011-06-29 LAB — BASIC METABOLIC PANEL
BUN: 19 mg/dL (ref 6–23)
CO2: 24 mEq/L (ref 19–32)
Chloride: 107 mEq/L (ref 96–112)
Creatinine, Ser: 1.2 mg/dL (ref 0.4–1.5)
Glucose, Bld: 73 mg/dL (ref 70–99)
Potassium: 4.6 mEq/L (ref 3.5–5.1)

## 2011-06-29 LAB — CBC WITH DIFFERENTIAL/PLATELET
Basophils Relative: 0.5 % (ref 0.0–3.0)
Eosinophils Absolute: 0.1 10*3/uL (ref 0.0–0.7)
Eosinophils Relative: 1.2 % (ref 0.0–5.0)
Hemoglobin: 14.2 g/dL (ref 13.0–17.0)
Lymphocytes Relative: 20.1 % (ref 12.0–46.0)
MCHC: 33 g/dL (ref 30.0–36.0)
Neutro Abs: 5 10*3/uL (ref 1.4–7.7)
RBC: 4.95 Mil/uL (ref 4.22–5.81)
WBC: 7.2 10*3/uL (ref 4.5–10.5)

## 2011-06-29 MED ORDER — FUROSEMIDE 40 MG PO TABS: 40.0000 mg | ORAL_TABLET | Freq: Every day | ORAL | Status: DC

## 2011-06-29 MED ORDER — FLUTICASONE-SALMETEROL 250-50 MCG/DOSE IN AEPB: 1.0000 | INHALATION_SPRAY | Freq: Two times a day (BID) | RESPIRATORY_TRACT | Status: DC

## 2011-06-29 NOTE — Progress Notes (Addendum)
Patient Name: [name] Date of Birth: Mar 12, 1941 Age: 71 y.o. Medical Record Number: [id number] Gender: male Date of Encounter: 06/29/2011  History of Present Illness:  [name] is a 71 y.o. very pleasant male patient who presents with the following:  Notable patient with 60 year smoking history with COPD, who presents with a 4-5 day history of acute new onset LE edema, and edema into his thighs. He is continually short of breath, but afebrile. History is also significant for syncopal type of event in December of 2012. At that time he also did have some electrocardiogram changes that were noted. At that time, the patient declined a stress test.  He did have an echocardiogram in 2011 which showed normal ejection fraction.  He was seen in May 23, 2011 and felt to be having an chronic obstructive pulmonary disease exacerbation. Ultimately, I saw him myself a few days later, he had an abnormal chest x-ray, and I thought that he likely had a pneumonia as well as chronic obstructive pulmonary disease exacerbation and placed him on Levaquin and oral steroids. The shortness of breath improved somewhat at that time.  Currently, he is so short of breath he is minimally able to ambulate, and get short of breath very significantly with minimal exertion.  4-5 days, acutely swollen.   Never had swollen ankles.  Also is using Spiriva 3+ new onset lower extremity edema  Patient Active Problem List  Diagnoses  . HYPOTHYROIDISM NOS  . HYPERCHOLESTEROLEMIA  . TOBACCO ABUSE  . ESSENTIAL HYPERTENSION, BENIGN  . ABDOMINAL AORTIC ANEURYSM  . UNSPECIFIED PERIPHERAL VASCULAR DISEASE  . BRONCHITIS, OBSTRUCTIVE CHRONIC  . COPD, severe  . FATIGUE  . PROSTATE SPECIFIC ANTIGEN, ELEVATED  . BPH (benign prostatic hyperplasia)  . EKG abnormalities  . Cerumen impaction  . Dizziness  . Carotid bruit  . Subclavian arterial stenosis   Past Medical History  Diagnosis Date  . Thyroid disease    hypo  . Venous insufficiency   . Peripheral arterial disease   . Tobacco abuse   . COPD (chronic obstructive pulmonary disease)   . BPH (benign prostatic hyperplasia)   . Elevated PSA   . Arthritis    Past Surgical History  Procedure Date  . Cholecystectomy   . Angioplasty / stenting femoral    History  Substance Use Topics  . Smoking status: Current Everyday Smoker  . Smokeless tobacco: Not on file  . Alcohol Use: No   Family History  Problem Relation Age of Onset  . Alcohol abuse Father   . Cirrhosis Father    No Known Allergies Current Outpatient Prescriptions on File Prior to Visit  Medication Sig Dispense Refill  . albuterol (PROVENTIL HFA;VENTOLIN HFA) 108 (90 BASE) MCG/ACT inhaler Inhale 2 puffs into the lungs every 6 (six) hours as needed for wheezing.  3.7 g  1  . aspirin 81 MG tablet Take 81 mg by mouth daily.        Marland Kitchen levothyroxine (SYNTHROID, LEVOTHROID) 200 MCG tablet One tablet daily, take in addition to 200 mcg daily  30 tablet  6  . levothyroxine (SYNTHROID, LEVOTHROID) 25 MCG tablet Take 25 mcg by mouth every other day.      . pravastatin (PRAVACHOL) 40 MG tablet Take 1 tablet (40 mg total) by mouth daily.  30 tablet  6  . tiotropium (SPIRIVA HANDIHALER) 18 MCG inhalation capsule Place 1 capsule (18 mcg total) into inhaler and inhale daily.  30 capsule  12     Past  Medical History, Surgical History, Social History, Family History, Problem List, Medications, and Allergies have been reviewed and updated if relevant.  Review of Systems: No chest pain. Significant swelling and lower extremity and thighs. Dyspnea at rest. Severe dyspnea with minimal exertion. No headache. No nausea. No vomiting. No arm pain. No dizziness. No memory changes. No neurological changes. Otherwise, the pertinent positives and negatives are listed above and in the HPI, otherwise a full review of systems has been reviewed and is negative unless noted positive.   Physical  Examination: Filed Vitals:   06/29/11 1026  BP: 130/78  Pulse: 101  Temp: 97.4 F (36.3 C)  TempSrc: Oral  Height: 5\' 9"  (1.753 m)  Weight: 169 lb 12.8 oz (77.021 kg)  SpO2: 98%    Body mass index is 25.08 kg/(m^2).   GEN: WDWN, NAD, Non-toxic, A & O x 3 HEENT: Atraumatic, Normocephalic. Neck supple. No masses, No LAD. Ears and Nose: No external deformity. CV: RRR, No M/G/R. No JVD. No thrill. No extra heart sounds. PULM: bilateral lower lobes with diffuse crackles. EXTR: prominent clubbing. 3-4+ large cavity edema. Trace to 1+ lower thigh edema. NEURO Normal gait.  PSYCH: Normally interactive. Conversant. Not depressed or anxious appearing.  Calm demeanor.    Dg Chest 2 View  06/29/2011  *RADIOLOGY REPORT*  Clinical Data: Shortness of breath.  CHEST - 2 VIEW  Comparison: 05/23/2011  Findings: Right lung is clear.  Flame shaped opacity overlying the left heart border persists with left-sided pleural thickening versus small left pleural effusion. Cardiopericardial silhouette is at upper limits of normal for size. Bones are diffusely demineralized.  IMPRESSION: Persistent flame shaped density in the left lower lung with associated left-sided pleural changes/fluid.  Given the lack of interval change, neoplasm is now more a concern.  CT chest with contrast recommended to further evaluate.  Original Report Authenticated By: ERIC A. MANSELL, M.D.     Assessment and Plan: 1. Shortness of breath  DG Chest 2 View, Brain natriuretic peptide, CBC with Differential, Basic metabolic panel, Hepatic function panel, Fluticasone-Salmeterol (ADVAIR DISKUS) 250-50 MCG/DOSE AEPB, Ambulatory referral to Cardiology, CT Chest W Wo Contrast, CT Chest W Contrast  2. Peripheral edema  Brain natriuretic peptide, CBC with Differential, Basic metabolic panel, Hepatic function panel  3. COPD (chronic obstructive pulmonary disease)  Brain natriuretic peptide, CBC with Differential, Basic metabolic panel, Hepatic  function panel, Fluticasone-Salmeterol (ADVAIR DISKUS) 250-50 MCG/DOSE AEPB  4. Syncope  Brain natriuretic peptide, CBC with Differential, Basic metabolic panel, Hepatic function panel  5. Acute systolic heart failure  furosemide (LASIX) 40 MG tablet, Ambulatory referral to Cardiology  6. Abnormal chest x-ray  CT Chest W Wo Contrast, CT Chest W Contrast  7. Lung mass  CT Chest W Wo Contrast, CT Chest W Contrast    Cardiac BNP is 1500. I called the patient discussed this, explain that he is likely an acute new onset heart failure. Lasix 40 mg b.i.d. At day of encounter. The Lasix b.i.d. The next day. We have arranged cardiology followup with Dr. Mariah Milling to help fully work him up.   Chest x-ray is abnormal with a persistent density in the LEFT lower lobe and a persistent left-sided pleural effusion. With a 60 year history of smoking, neoplasm needs to be ruled out. Obtain a CT of the chest with and without contrast to evaluate for lung carcinoma.  Results for orders placed in visit on 06/29/11  BRAIN NATRIURETIC PEPTIDE      Component Value Range  Pro B Natriuretic peptide (BNP) 1470.0 (*) 0.0 - 100.0 (pg/mL)  CBC WITH DIFFERENTIAL      Component Value Range   WBC 7.2  4.5 - 10.5 (K/uL)   RBC 4.95  4.22 - 5.81 (Mil/uL)   Hemoglobin 14.2  13.0 - 17.0 (g/dL)   HCT 09.8  11.9 - 14.7 (%)   MCV 86.9  78.0 - 100.0 (fl)   MCHC 33.0  30.0 - 36.0 (g/dL)   RDW 82.9 (*) 56.2 - 14.6 (%)   Platelets 286.0  150.0 - 400.0 (K/uL)   Neutrophils Relative 69.0  43.0 - 77.0 (%)   Lymphocytes Relative 20.1  12.0 - 46.0 (%)   Monocytes Relative 9.2  3.0 - 12.0 (%)   Eosinophils Relative 1.2  0.0 - 5.0 (%)   Basophils Relative 0.5  0.0 - 3.0 (%)   Neutro Abs 5.0  1.4 - 7.7 (K/uL)   Lymphs Abs 1.4  0.7 - 4.0 (K/uL)   Monocytes Absolute 0.7  0.1 - 1.0 (K/uL)   Eosinophils Absolute 0.1  0.0 - 0.7 (K/uL)   Basophils Absolute 0.0  0.0 - 0.1 (K/uL)  BASIC METABOLIC PANEL      Component Value Range   Sodium 138   135 - 145 (mEq/L)   Potassium 4.6  3.5 - 5.1 (mEq/L)   Chloride 107  96 - 112 (mEq/L)   CO2 24  19 - 32 (mEq/L)   Glucose, Bld 73  70 - 99 (mg/dL)   BUN 19  6 - 23 (mg/dL)   Creatinine, Ser 1.2  0.4 - 1.5 (mg/dL)   Calcium 9.1  8.4 - 13.0 (mg/dL)   GFR 86.57  >84.69 (mL/min)  HEPATIC FUNCTION PANEL      Component Value Range   Total Bilirubin 0.9  0.3 - 1.2 (mg/dL)   Bilirubin, Direct 0.3  0.0 - 0.3 (mg/dL)   Alkaline Phosphatase 82  39 - 117 (U/L)   AST 19  0 - 37 (U/L)   ALT 17  0 - 53 (U/L)   Total Protein 6.4  6.0 - 8.3 (g/dL)   Albumin 3.7  3.5 - 5.2 (g/dL)    Addendum:  Dg Chest 2 View  06/29/2011  *RADIOLOGY REPORT*  Clinical Data: Shortness of breath.  CHEST - 2 VIEW  Comparison: 05/23/2011  Findings: Right lung is clear.  Flame shaped opacity overlying the left heart border persists with left-sided pleural thickening versus small left pleural effusion. Cardiopericardial silhouette is at upper limits of normal for size. Bones are diffusely demineralized.  IMPRESSION: Persistent flame shaped density in the left lower lung with associated left-sided pleural changes/fluid.  Given the lack of interval change, neoplasm is now more a concern.  CT chest with contrast recommended to further evaluate.  Original Report Authenticated By: ERIC A. MANSELL, M.D.   Ct Chest W Contrast  07/01/2011  *RADIOLOGY REPORT*  Clinical Data: Evaluate lung mass.  Potential neoplasm.  CT CHEST WITH CONTRAST  Technique:  Multidetector CT imaging of the chest was performed following the standard protocol during bolus administration of intravenous contrast.  Contrast: 80mL OMNIPAQUE IOHEXOL 300 MG/ML IJ SOLN  Comparison: None  Findings: No supraclavicular adenopathy.  No axillary adenopathy.  Enlarged precarinal lymph node measures 1 cm, image 23.  There is a lymph node adjacent to the ascending aorta which measures 1.1 cm, image 23.  There are calcifications involving the RCA, LAD, and left circumflex coronary  arteries  Bilateral hilar lymph nodes are identified which appear borderline  enlarged.  Small bilateral pleural effusions are noted left greater than right.  There is advanced emphysema.  Loculated fluid is identified within the inner lobar fissure of the left lung, image 39.  Focal area of subpleural consolidation is noted within the periphery of the right upper lobe, image 30. This is nonspecific.  Mild multilevel spondylosis is identified within the thoracic spine.  IMPRESSION:  1.  The opacity identified on chest radiograph corresponds to a focal area of loculated fluid along the interlobar fissure of the left lung.  2.  There is borderline enlarged mediastinal and bilateral hilar adenopathy. Nonspecific.  Differential considerations include granulomatous inflammation/infection, metastatic adenopathy, or lymphoma. 3.  Left pleural effusion 4.  Advanced emphysema. 5.  There is atherosclerosis of the thoracic aorta, the great vessels of the mediastinum and the coronary arteries, including calcified atherosclerotic plaque in the RCA, LAD and left circumflex coronary arteries.  Atherosclerosis, including three-vessel coronary artery disease. Please note that although the presence of coronary artery calcium documents the presence of coronary artery disease, the severity of this disease and any potential stenosis cannot be assessed on this non-gated CT examination.  Assessment for potential risk factor modification, dietary therapy or pharmacologic therapy may be warranted, if clinically indicated.  Original Report Authenticated By: Rosealee Albee, M.D.   The patient has multiple findings on CT of the chest with medastinal adenopathy, enlargement which needs to be fully evaluated. Will consult pulmonary for definitive diagnosis and management.  SOB completely resolved with diuresis. Verbally spoke to the patient right now. Explained all. He is having an echo and cardiac cath this week.  Aiyanna Awtrey MD   07/03/2011 11:25 AM

## 2011-06-30 ENCOUNTER — Ambulatory Visit (INDEPENDENT_AMBULATORY_CARE_PROVIDER_SITE_OTHER): Payer: Medicare Other | Admitting: Cardiovascular Disease

## 2011-06-30 ENCOUNTER — Encounter: Payer: Self-pay | Admitting: Cardiovascular Disease

## 2011-06-30 DIAGNOSIS — I739 Peripheral vascular disease, unspecified: Secondary | ICD-10-CM

## 2011-06-30 DIAGNOSIS — Z5181 Encounter for therapeutic drug level monitoring: Secondary | ICD-10-CM | POA: Diagnosis not present

## 2011-06-30 DIAGNOSIS — R9431 Abnormal electrocardiogram [ECG] [EKG]: Secondary | ICD-10-CM | POA: Diagnosis not present

## 2011-06-30 DIAGNOSIS — Z01818 Encounter for other preprocedural examination: Secondary | ICD-10-CM | POA: Diagnosis not present

## 2011-06-30 DIAGNOSIS — R0602 Shortness of breath: Secondary | ICD-10-CM | POA: Diagnosis not present

## 2011-06-30 DIAGNOSIS — I771 Stricture of artery: Secondary | ICD-10-CM

## 2011-06-30 DIAGNOSIS — F17200 Nicotine dependence, unspecified, uncomplicated: Secondary | ICD-10-CM

## 2011-06-30 MED ORDER — POTASSIUM CHLORIDE ER 10 MEQ PO TBCR: 10.0000 meq | EXTENDED_RELEASE_TABLET | Freq: Two times a day (BID) | ORAL | Status: DC

## 2011-06-30 NOTE — Assessment & Plan Note (Addendum)
Symptoms of shortness of breath or severe. Worsening lower extreme edema. Clinical presentation is concerning for worsening heart failure, possibly systolic. Unable to exclude PE. He has been on Lasix for one day and we have suggested he continue on Lasix twice a day with potassium. He has a CT scan of the chest scheduled for tomorrow. Possible left lower lung mass seen on chest x-ray. This would also help exclude a PE. I suggested he go to the emergency room if symptoms get worse.   Unable to exclude underlying severe coronary artery disease and angina. We will schedule a echocardiogram and cardiac catheterization for next week. Again if symptoms get worse, we have suggested he go to the emergency room. Risks and benefits of the procedure/catheterization were discussed with the patient.

## 2011-06-30 NOTE — Assessment & Plan Note (Signed)
We have encouraged him to continue to work on weaning his cigarettes and smoking cessation. He will continue to work on this and does not want any assistance with chantix.  

## 2011-06-30 NOTE — Progress Notes (Signed)
Patient ID: [name], male    DOB: 06-Feb-1941, 71 y.o.   MRN: [id number]  HPI Comments: [name] is a pleasant 71 year old gentleman with a history of hyperlipidemia, long smoking history for 60 years, stent to his left lower extremity for claudication followed by Dr. Evette Cristal, also with a vein ablation, who presents for routine followup. Recent carotid showed 40% bilateral disease, subclavian disease on the left  On his last clinic visit, he was noted to have abnormal EKG compared to prior study. Further workup was ordered and he did not want anything done at the time. He had a previous episode of dizziness several months ago felt secondary to a noncardiac issue, possibly in her ear.   He reports that over the past several weeks, he has had worsening shortness of breath. He reports being unable to breath last night. He can not lie flat and has to sleep sitting up. He has been treated for pneumonia with antibiotics x2. Recent chest x-ray concerning for mass in the left lower lobe and he is scheduled for CT scan of the chest tomorrow. Shortness of breath has been severe and down with pitting edema that has developed over the past several weeks with 10 pound weight gain. Started on Lasix yesterday with mild diuresis. He does not see much improvement with inhalers. No further dizziness.   EKG shows normal sinus rhythm with a rate 86 beats per minute with ST and T wave abnormality in leads V4 through V6, Leads 2, 3, aVF    Outpatient Encounter Prescriptions as of 04/25/2011  Medication Sig Dispense Refill  . aspirin 81 MG tablet Take 81 mg by mouth daily.        Marland Kitchen levothyroxine (SYNTHROID, LEVOTHROID) 200 MCG tablet One tablet daily, take in addition to 200 mcg daily  30 tablet  11  . DISCONTD: levothyroxine (SYNTHROID, LEVOTHROID) 25 MCG tablet One tablet daily, take in addition to 200 mcg daily  30 tablet  11    Review of Systems  Constitutional: Positive for unexpected weight change.  HENT:  Negative.   Eyes: Negative.   Respiratory: Positive for cough and shortness of breath.   Cardiovascular: Positive for leg swelling.  Gastrointestinal: Negative.   Musculoskeletal: Negative.   Skin: Negative.   Hematological: Negative.   Psychiatric/Behavioral: Negative.   All other systems reviewed and are negative.   Social Hx: He has smoked most of his life for 60 years, currently smoking several cigarettes per day. He reports that he does not drink alcohol or use illicit drugs.  Family history Alcohol abuse in his father and Cirrhosis in his father.  BP 157/88  Pulse 87  Ht 5\' 9"  (1.753 m)  Wt 168 lb (76.204 kg)  BMI 24.81 kg/m2  Physical Exam  Nursing note and vitals reviewed. Constitutional: He is oriented to person, place, and time. He appears well-developed and well-nourished.  HENT:  Head: Normocephalic.  Nose: Nose normal.  Mouth/Throat: Oropharynx is clear and moist.  Eyes: Conjunctivae are normal. Pupils are equal, round, and reactive to light.  Neck: Normal range of motion. Neck supple. No JVD present. Carotid bruit is present.  Cardiovascular: Normal rate, regular rhythm, S1 normal, S2 normal, normal heart sounds and intact distal pulses.  Exam reveals no gallop and no friction rub.   No murmur heard. Pulses:      Carotid pulses are 2+ on the right side, and 2+ on the left side.      Radial pulses are 2+ on  the right side, and 1+ on the left side.       Femoral pulses are 1+ on the right side, and 1+ on the left side.      Dorsalis pedis pulses are 2+ on the right side, and 0 on the left side.       Posterior tibial pulses are 2+ on the right side, and 0 on the left side.       2+ pitting edema  Pulmonary/Chest: He is in respiratory distress. He has decreased breath sounds in the right lower field and the left lower field. He has no wheezes. He has no rales. He exhibits no tenderness.  Abdominal: Soft. Bowel sounds are normal. He exhibits no distension. There  is no tenderness.  Musculoskeletal: Normal range of motion. He exhibits edema. He exhibits no tenderness.  Lymphadenopathy:    He has no cervical adenopathy.  Neurological: He is alert and oriented to person, place, and time. Coordination normal.  Skin: Skin is warm and dry. No rash noted. No erythema.  Psychiatric: He has a normal mood and affect. His behavior is normal. Judgment and thought content normal.           Assessment and Plan

## 2011-06-30 NOTE — Assessment & Plan Note (Signed)
Subclavian stenosis seen on recent carotid ultrasound. We will manage his other issues first at this time including his shortness of breath, fluid retention.

## 2011-06-30 NOTE — Assessment & Plan Note (Signed)
Mild-to-moderate carotid arterial disease bilaterally. Encouraged smoking cessation.

## 2011-06-30 NOTE — Patient Instructions (Addendum)
You are doing well. Please take lasix twice a day with potassium  We will call you to schedule a cardiac cath next week   We will schedule you for an echocardiogram for shortness of breath  Please call us if you have new issues that need to be addressed before your next appt.  Your physician wants you to follow-up in: 1 months.  You will receive a reminder letter in the mail two months in advance. If you don't receive a letter, please call our office to schedule the follow-up appointment.

## 2011-07-01 ENCOUNTER — Encounter (HOSPITAL_COMMUNITY): Payer: Self-pay | Admitting: Pharmacy Technician

## 2011-07-01 ENCOUNTER — Ambulatory Visit (INDEPENDENT_AMBULATORY_CARE_PROVIDER_SITE_OTHER)
Admission: RE | Admit: 2011-07-01 | Discharge: 2011-07-01 | Disposition: A | Discharge status: Home / Self Care | Payer: Medicare Other | Source: Ambulatory Visit | Attending: Family Medicine | Admitting: Family Medicine

## 2011-07-01 DIAGNOSIS — R222 Localized swelling, mass and lump, trunk: Secondary | ICD-10-CM | POA: Diagnosis not present

## 2011-07-01 DIAGNOSIS — J90 Pleural effusion, not elsewhere classified: Secondary | ICD-10-CM | POA: Diagnosis not present

## 2011-07-01 DIAGNOSIS — R0602 Shortness of breath: Secondary | ICD-10-CM

## 2011-07-01 DIAGNOSIS — R599 Enlarged lymph nodes, unspecified: Secondary | ICD-10-CM | POA: Diagnosis not present

## 2011-07-01 DIAGNOSIS — R918 Other nonspecific abnormal finding of lung field: Secondary | ICD-10-CM

## 2011-07-01 DIAGNOSIS — R9389 Abnormal findings on diagnostic imaging of other specified body structures: Secondary | ICD-10-CM

## 2011-07-01 LAB — PROTIME-INR
INR: 1.4 — ABNORMAL HIGH (ref 0.8–1.2)
Prothrombin Time: 14.8 s — ABNORMAL HIGH (ref 9.1–12.0)

## 2011-07-01 MED ORDER — IOHEXOL 300 MG/ML  SOLN
80.0000 mL | Freq: Once | INTRAMUSCULAR | Status: AC | PRN
  Administered 2011-07-01: 80 mL via INTRAVENOUS

## 2011-07-03 NOTE — Progress Notes (Signed)
Addended by: [name] on: 07/03/2011 11:28 AM   Modules accepted: Orders

## 2011-07-05 ENCOUNTER — Other Ambulatory Visit: Payer: Self-pay

## 2011-07-05 ENCOUNTER — Other Ambulatory Visit (INDEPENDENT_AMBULATORY_CARE_PROVIDER_SITE_OTHER): Payer: Medicare Other

## 2011-07-05 DIAGNOSIS — R9431 Abnormal electrocardiogram [ECG] [EKG]: Secondary | ICD-10-CM

## 2011-07-05 DIAGNOSIS — R0602 Shortness of breath: Secondary | ICD-10-CM

## 2011-07-05 DIAGNOSIS — I359 Nonrheumatic aortic valve disorder, unspecified: Secondary | ICD-10-CM

## 2011-07-07 ENCOUNTER — Other Ambulatory Visit: Payer: Self-pay

## 2011-07-07 ENCOUNTER — Encounter (HOSPITAL_COMMUNITY): Payer: Self-pay | Admitting: Cardiovascular Disease

## 2011-07-07 ENCOUNTER — Other Ambulatory Visit: Payer: Self-pay | Admitting: Cardiovascular Disease

## 2011-07-07 ENCOUNTER — Inpatient Hospital Stay (HOSPITAL_COMMUNITY)
Admission: RE | Admit: 2011-07-07 | Discharge: 2011-07-22 | DRG: 233 | Disposition: A | Discharge status: Home / Self Care | Payer: Medicare Other | Source: Ambulatory Visit | Attending: Cardiothoracic Surgery | Admitting: Cardiothoracic Surgery

## 2011-07-07 ENCOUNTER — Inpatient Hospital Stay (HOSPITAL_COMMUNITY): Payer: Medicare Other

## 2011-07-07 ENCOUNTER — Encounter (HOSPITAL_COMMUNITY)
Admission: RE | Disposition: A | Discharge status: Home / Self Care | Payer: Self-pay | Source: Ambulatory Visit | Attending: Cardiothoracic Surgery

## 2011-07-07 DIAGNOSIS — Z09 Encounter for follow-up examination after completed treatment for conditions other than malignant neoplasm: Secondary | ICD-10-CM | POA: Diagnosis not present

## 2011-07-07 DIAGNOSIS — R0602 Shortness of breath: Secondary | ICD-10-CM

## 2011-07-07 DIAGNOSIS — I6529 Occlusion and stenosis of unspecified carotid artery: Secondary | ICD-10-CM | POA: Diagnosis present

## 2011-07-07 DIAGNOSIS — J449 Chronic obstructive pulmonary disease, unspecified: Secondary | ICD-10-CM | POA: Diagnosis not present

## 2011-07-07 DIAGNOSIS — I517 Cardiomegaly: Secondary | ICD-10-CM | POA: Diagnosis not present

## 2011-07-07 DIAGNOSIS — D696 Thrombocytopenia, unspecified: Secondary | ICD-10-CM | POA: Diagnosis present

## 2011-07-07 DIAGNOSIS — I2789 Other specified pulmonary heart diseases: Secondary | ICD-10-CM | POA: Diagnosis present

## 2011-07-07 DIAGNOSIS — I5023 Acute on chronic systolic (congestive) heart failure: Secondary | ICD-10-CM | POA: Diagnosis not present

## 2011-07-07 DIAGNOSIS — N179 Acute kidney failure, unspecified: Secondary | ICD-10-CM | POA: Diagnosis present

## 2011-07-07 DIAGNOSIS — I10 Essential (primary) hypertension: Secondary | ICD-10-CM | POA: Diagnosis present

## 2011-07-07 DIAGNOSIS — F17200 Nicotine dependence, unspecified, uncomplicated: Secondary | ICD-10-CM | POA: Diagnosis present

## 2011-07-07 DIAGNOSIS — R0989 Other specified symptoms and signs involving the circulatory and respiratory systems: Secondary | ICD-10-CM | POA: Diagnosis not present

## 2011-07-07 DIAGNOSIS — I779 Disorder of arteries and arterioles, unspecified: Secondary | ICD-10-CM | POA: Diagnosis present

## 2011-07-07 DIAGNOSIS — E870 Hyperosmolality and hypernatremia: Secondary | ICD-10-CM | POA: Diagnosis present

## 2011-07-07 DIAGNOSIS — R918 Other nonspecific abnormal finding of lung field: Secondary | ICD-10-CM | POA: Diagnosis not present

## 2011-07-07 DIAGNOSIS — Z01810 Encounter for preprocedural cardiovascular examination: Secondary | ICD-10-CM | POA: Diagnosis not present

## 2011-07-07 DIAGNOSIS — J4489 Other specified chronic obstructive pulmonary disease: Secondary | ICD-10-CM | POA: Diagnosis not present

## 2011-07-07 DIAGNOSIS — D62 Acute posthemorrhagic anemia: Secondary | ICD-10-CM | POA: Diagnosis not present

## 2011-07-07 DIAGNOSIS — R599 Enlarged lymph nodes, unspecified: Secondary | ICD-10-CM | POA: Diagnosis not present

## 2011-07-07 DIAGNOSIS — E039 Hypothyroidism, unspecified: Secondary | ICD-10-CM | POA: Diagnosis present

## 2011-07-07 DIAGNOSIS — I5021 Acute systolic (congestive) heart failure: Secondary | ICD-10-CM

## 2011-07-07 DIAGNOSIS — I2510 Atherosclerotic heart disease of native coronary artery without angina pectoris: Secondary | ICD-10-CM | POA: Diagnosis not present

## 2011-07-07 DIAGNOSIS — E7800 Pure hypercholesterolemia, unspecified: Secondary | ICD-10-CM | POA: Diagnosis present

## 2011-07-07 DIAGNOSIS — Z951 Presence of aortocoronary bypass graft: Secondary | ICD-10-CM | POA: Diagnosis not present

## 2011-07-07 DIAGNOSIS — I658 Occlusion and stenosis of other precerebral arteries: Secondary | ICD-10-CM | POA: Diagnosis present

## 2011-07-07 DIAGNOSIS — I255 Ischemic cardiomyopathy: Secondary | ICD-10-CM

## 2011-07-07 DIAGNOSIS — Z87891 Personal history of nicotine dependence: Secondary | ICD-10-CM | POA: Diagnosis present

## 2011-07-07 DIAGNOSIS — J90 Pleural effusion, not elsewhere classified: Secondary | ICD-10-CM | POA: Diagnosis not present

## 2011-07-07 DIAGNOSIS — J9620 Acute and chronic respiratory failure, unspecified whether with hypoxia or hypercapnia: Secondary | ICD-10-CM | POA: Diagnosis not present

## 2011-07-07 DIAGNOSIS — I771 Stricture of artery: Secondary | ICD-10-CM | POA: Diagnosis present

## 2011-07-07 DIAGNOSIS — Z01811 Encounter for preprocedural respiratory examination: Secondary | ICD-10-CM | POA: Diagnosis not present

## 2011-07-07 DIAGNOSIS — I359 Nonrheumatic aortic valve disorder, unspecified: Secondary | ICD-10-CM | POA: Diagnosis not present

## 2011-07-07 DIAGNOSIS — I509 Heart failure, unspecified: Secondary | ICD-10-CM | POA: Diagnosis present

## 2011-07-07 DIAGNOSIS — I428 Other cardiomyopathies: Secondary | ICD-10-CM | POA: Diagnosis present

## 2011-07-07 DIAGNOSIS — J9819 Other pulmonary collapse: Secondary | ICD-10-CM | POA: Diagnosis not present

## 2011-07-07 DIAGNOSIS — I519 Heart disease, unspecified: Secondary | ICD-10-CM | POA: Diagnosis present

## 2011-07-07 DIAGNOSIS — Z7982 Long term (current) use of aspirin: Secondary | ICD-10-CM | POA: Diagnosis not present

## 2011-07-07 DIAGNOSIS — I739 Peripheral vascular disease, unspecified: Secondary | ICD-10-CM | POA: Diagnosis present

## 2011-07-07 HISTORY — PX: LEFT AND RIGHT HEART CATHETERIZATION WITH CORONARY ANGIOGRAM: SHX5449

## 2011-07-07 HISTORY — DX: Hypothyroidism, unspecified: E03.9

## 2011-07-07 LAB — BASIC METABOLIC PANEL
Calcium: 8.9 mg/dL (ref 8.4–10.5)
GFR calc Af Amer: 74 mL/min — ABNORMAL LOW (ref >90)
GFR calc non Af Amer: 64 mL/min — ABNORMAL LOW (ref >90)
Potassium: 4 mEq/L (ref 3.5–5.1)
Sodium: 136 mEq/L (ref 135–145)

## 2011-07-07 LAB — PROTIME-INR
INR: 1.19 (ref 0.00–1.49)
Prothrombin Time: 15.4 seconds — ABNORMAL HIGH (ref 11.6–15.2)

## 2011-07-07 LAB — POCT I-STAT 3, ART BLOOD GAS (G3+)
O2 Saturation: 92 %
TCO2: 22 mmol/L (ref 0–100)
pCO2 arterial: 31.5 mmHg — ABNORMAL LOW (ref 35.0–45.0)
pO2, Arterial: 61 mmHg — ABNORMAL LOW (ref 80.0–100.0)

## 2011-07-07 LAB — SURGICAL PCR SCREEN
MRSA, PCR: NEGATIVE
Staphylococcus aureus: NEGATIVE

## 2011-07-07 LAB — POCT I-STAT 3, VENOUS BLOOD GAS (G3P V)
Bicarbonate: 21.6 mEq/L (ref 20.0–24.0)
pH, Ven: 7.389 — ABNORMAL HIGH (ref 7.250–7.300)
pO2, Ven: 32 mmHg (ref 30.0–45.0)

## 2011-07-07 SURGERY — LEFT AND RIGHT HEART CATHETERIZATION WITH CORONARY ANGIOGRAM

## 2011-07-07 MED ORDER — SODIUM CHLORIDE 0.9 % IV SOLN
1.0000 mL/kg/h | INTRAVENOUS | Status: AC
  Administered 2011-07-07: 1 mL/kg/h via INTRAVENOUS

## 2011-07-07 MED ORDER — NITROGLYCERIN 0.2 MG/ML ON CALL CATH LAB
INTRAVENOUS | Status: AC
  Filled 2011-07-07: qty 1

## 2011-07-07 MED ORDER — ASPIRIN 81 MG PO CHEW
324.0000 mg | CHEWABLE_TABLET | ORAL | Status: AC
  Administered 2011-07-07: 324 mg via ORAL

## 2011-07-07 MED ORDER — ASPIRIN 81 MG PO CHEW
81.0000 mg | CHEWABLE_TABLET | Freq: Every day | ORAL | Status: DC
  Administered 2011-07-07 – 2011-07-10 (×4): 81 mg via ORAL
  Filled 2011-07-07 (×3): qty 1

## 2011-07-07 MED ORDER — ATORVASTATIN CALCIUM 20 MG PO TABS
20.0000 mg | ORAL_TABLET | Freq: Every day | ORAL | Status: DC
  Administered 2011-07-07 – 2011-07-10 (×4): 20 mg via ORAL
  Filled 2011-07-07 (×5): qty 1

## 2011-07-07 MED ORDER — ACETAMINOPHEN 325 MG PO TABS
650.0000 mg | ORAL_TABLET | ORAL | Status: DC | PRN
  Administered 2011-07-11: 650 mg via ORAL
  Filled 2011-07-07: qty 2

## 2011-07-07 MED ORDER — HEPARIN (PORCINE) IN NACL 2-0.9 UNIT/ML-% IJ SOLN
INTRAMUSCULAR | Status: AC
  Filled 2011-07-07: qty 2000

## 2011-07-07 MED ORDER — NICOTINE 14 MG/24HR TD PT24
14.0000 mg | MEDICATED_PATCH | Freq: Every day | TRANSDERMAL | Status: DC
  Filled 2011-07-07: qty 1

## 2011-07-07 MED ORDER — HEPARIN (PORCINE) IN NACL 100-0.45 UNIT/ML-% IJ SOLN
850.0000 [IU]/h | INTRAMUSCULAR | Status: DC
  Administered 2011-07-07: 850 [IU]/h via INTRAVENOUS
  Filled 2011-07-07 (×2): qty 250

## 2011-07-07 MED ORDER — MIDAZOLAM HCL 2 MG/2ML IJ SOLN
INTRAMUSCULAR | Status: AC
  Filled 2011-07-07: qty 2

## 2011-07-07 MED ORDER — ONDANSETRON HCL 4 MG/2ML IJ SOLN: 4.0000 mg | Freq: Four times a day (QID) | INTRAMUSCULAR | Status: DC | PRN

## 2011-07-07 MED ORDER — IPRATROPIUM BROMIDE 0.02 % IN SOLN: 0.5000 mg | RESPIRATORY_TRACT | Status: DC | PRN

## 2011-07-07 MED ORDER — LIDOCAINE HCL (PF) 1 % IJ SOLN
INTRAMUSCULAR | Status: AC
  Filled 2011-07-07: qty 30

## 2011-07-07 MED ORDER — LEVOTHYROXINE SODIUM 25 MCG PO TABS
25.0000 ug | ORAL_TABLET | ORAL | Status: DC
  Administered 2011-07-08: 25 ug via ORAL
  Filled 2011-07-07 (×2): qty 1

## 2011-07-07 MED ORDER — SODIUM CHLORIDE 0.9 % IV SOLN
250.0000 mL | INTRAVENOUS | Status: DC | PRN
  Administered 2011-07-07: 11:00:00 via INTRAVENOUS

## 2011-07-07 MED ORDER — ALBUTEROL SULFATE (5 MG/ML) 0.5% IN NEBU
2.5000 mg | INHALATION_SOLUTION | RESPIRATORY_TRACT | Status: DC | PRN
  Administered 2011-07-08: 2.5 mg via RESPIRATORY_TRACT

## 2011-07-07 MED ORDER — METOPROLOL TARTRATE 12.5 MG HALF TABLET
12.5000 mg | ORAL_TABLET | Freq: Two times a day (BID) | ORAL | Status: DC
  Administered 2011-07-07 – 2011-07-08 (×2): 12.5 mg via ORAL
  Filled 2011-07-07 (×3): qty 1

## 2011-07-07 MED ORDER — LEVOTHYROXINE SODIUM 200 MCG PO TABS
200.0000 ug | ORAL_TABLET | Freq: Every day | ORAL | Status: DC
  Filled 2011-07-07 (×2): qty 1

## 2011-07-07 MED ORDER — SODIUM CHLORIDE 0.9 % IV SOLN: 1.0000 mL/kg/h | INTRAVENOUS | Status: DC

## 2011-07-07 MED ORDER — NITROGLYCERIN 0.4 MG SL SUBL: 0.4000 mg | SUBLINGUAL_TABLET | SUBLINGUAL | Status: DC | PRN

## 2011-07-07 MED ORDER — NITROGLYCERIN 0.3 MG SL SUBL
0.3000 mg | SUBLINGUAL_TABLET | SUBLINGUAL | Status: DC | PRN
  Filled 2011-07-07: qty 100

## 2011-07-07 MED ORDER — ASPIRIN 81 MG PO CHEW
CHEWABLE_TABLET | ORAL | Status: AC
  Administered 2011-07-07: 81 mg via ORAL
  Filled 2011-07-07: qty 3

## 2011-07-07 NOTE — Consult Note (Signed)
Name: [name] MRN: [id number] DOB: 09-16-1940  LOS: 0  PULMONARY CONSULT NOTE  History of Present Illness: This is a 71 y/o male with severe CAD who was admitted on 3/14 after a LHC showed severe CAD and CHF.  He is to go for a CABG next week and pulmonary medicine is consulted for perioperative pulmonary risk stratification.  He states that up until three months ago he always considered himself healthy.  However after Christmas he developed shortness of breath.  He was treated for pneumonia twice and given spiriva and albuterol but these measures did not help.  He saw Dr. Mariah Milling for further evaluation after he developed leg swelling and was found to have severe left main, severe LAD and prox circ and severe mid RCA disease with an LVEF at 20-25%.    Lines / Drains:   Cultures / Sepsis markers:   Antibiotics:   Tests / Events: 07/01/11 CT chest: marked centrilobular emphysema in the upper lobes, RLL patch of ground glass, loculated fluid in fissure on left, small left pleural fluid collection     Past Medical History  Diagnosis Date  . Thyroid disease     hypo  . Venous insufficiency   . Peripheral arterial disease   . Tobacco abuse   . COPD (chronic obstructive pulmonary disease)   . Arthritis   . Hypothyroidism    Past Surgical History  Procedure Date  . Cholecystectomy   . Angioplasty / stenting femoral    Prior to Admission medications   Medication Sig Start Date End Date Taking? Authorizing Provider  aspirin 81 MG tablet Take 81 mg by mouth daily.     Yes Historical Provider, MD  furosemide (LASIX) 40 MG tablet Take 1 tablet (40 mg total) by mouth daily. 06/29/11 06/28/12 Yes [name], MD  levothyroxine (SYNTHROID, LEVOTHROID) 200 MCG tablet Take 200 mcg by mouth daily. Take in addition to 25 mcg daily 06/08/11  Yes [name], MD  levothyroxine (SYNTHROID, LEVOTHROID) 25 MCG tablet Take 25 mcg by mouth every other day.   Yes Historical Provider, MD  potassium  chloride (K-DUR) 10 MEQ tablet Take 1 tablet (10 mEq total) by mouth 2 (two) times daily. 06/30/11 06/29/12 Yes [name], MD   No Known Allergies Family History  Problem Relation Age of Onset  . Alcohol abuse Father   . Cirrhosis Father   . Hypothyroidism Sister    Social History  reports that he quit smoking about 2 months ago. His smoking use included Cigarettes. He smoked 1 pack per day. He has never used smokeless tobacco. He reports that he does not drink alcohol or use illicit drugs.  Review Of Systems   Gen: Denies fever, chills, weight change, fatigue, night sweats HEENT: Denies blurred vision, double vision, hearing loss, tinnitus, sinus congestion, rhinorrhea, sore throat, neck stiffness, dysphagia PULM: Per HPI CV: Per HPI GI: Denies abdominal pain, nausea, vomiting, diarrhea, hematochezia, melena, constipation, change in bowel habits GU: Denies dysuria, hematuria, polyuria, oliguria, urethral discharge Endocrine: Denies hot or cold intolerance, polyuria, polyphagia or appetite change Derm: Denies rash, dry skin, scaling or peeling skin change Heme: Denies easy bruising, bleeding, bleeding gums Neuro: Denies headache, numbness, weakness, slurred speech, loss of memory or consciousness  Vital Signs:   Filed Vitals:   07/07/11 1700 07/07/11 1800 07/07/11 2004 07/07/11 2133  BP: 141/73 139/85 118/78 105/64  Pulse: 82 83 83 84  Temp:   98.1 F (36.7 C)   TempSrc:  Oral   Resp: 27 18 16    Height:      Weight:      SpO2: 95% 95% 95%     Physical Examination: Gen: chronically ill appearing, no acute distress HEENT: NCAT, PERRL, EOMi, OP clear,  Neck: supple without masses PULM: Insp crackles and diminished sounds left base, otherwise clear to auscultation CV: RRR, no mgr, no JVD AB: BS+, soft, nontender, no hsm Ext: warm, no edema, clubbing is noted in hands, there is no cyanosis Derm: no rash or skin breakdown Neuro: A&Ox4, CN II-XII intact, strength 5/5 in  all 4 extremities Psyche: Normal mood and affect  Labs and Imaging:   CBC    Component Value Date/Time   WBC 7.2 06/29/2011 1121   RBC 4.95 06/29/2011 1121   HGB 14.2 06/29/2011 1121   HCT 43.0 06/29/2011 1121   PLT 286.0 06/29/2011 1121   MCV 86.9 06/29/2011 1121   MCH 29.0 04/22/2011 1507   MCHC 33.0 06/29/2011 1121   RDW 17.0* 06/29/2011 1121   LYMPHSABS 1.4 06/29/2011 1121   MONOABS 0.7 06/29/2011 1121   EOSABS 0.1 06/29/2011 1121   BASOSABS 0.0 06/29/2011 1121    BMET    Component Value Date/Time   NA 136 07/07/2011 1222   K 4.0 07/07/2011 1222   CL 101 07/07/2011 1222   CO2 25 07/07/2011 1222   GLUCOSE 83 07/07/2011 1222   BUN 25* 07/07/2011 1222   CREATININE 1.13 07/07/2011 1222   CREATININE 1.02 04/22/2011 1507   CALCIUM 8.9 07/07/2011 1222   GFRNONAA 64* 07/07/2011 1222   GFRAA 74* 07/07/2011 1222     Assessment and Plan:  This is a very pleasant male with severe CAD who needs a CABG and pulmonary medicine is consulted for evaluation of perioperative pulmonary risk stratification.  CAD (coronary artery disease), native coronary artery (07/07/2011)   Assessment: severe, with ischaemic cardiomyopathy; needs CABG   Plan:  -per cardiology and CV surgery  COPD/chronic bronchitis   Assessment: centrilobular upper lobe emphysema, appears that dyspnea is mostly due to CHF, but suspect that emphysema is contributing; we do not have PFT's at this point to fully assess   Plan: -I have added duoneb prn -need pulmonary function testing to better risk stratify -consider lung volume reduction surgery given upper lobe emphysema (better evaluated after full PFT's back) -we will follow up and leave final recommendations after PFT's are complete   Best practices / Disposition: PCCM consulting, Cardiology primary service  Heber Waller, M.D. Pulmonary and Critical Care Medicine [facility] Pager: [phone]  07/07/2011, 9:35 PM

## 2011-07-07 NOTE — Procedures (Deleted)
PCCM Bronchoscopy Procedure Note  The patient was informed of the risks (including but not limited to bleeding, infection, respiratory failure, lung injury, tooth/oral injury) and benefits of the procedure and gave consent, see chart.  Indication: left lung white out, likely mucus plug  Location: 2308  Condition pre procedure: Critically ill, on pressors and vent  Medications for procedure: versed 2mg  IV in addition to fentanyl gtt, 1cc of 1% lidocaine in trachea  Procedure Duration: 3 minutes  Procedure description: The bronchoscope was introduced through the tracheostomy tube and passed to the bilateral lungs to the level of the subsegmental bronchi throughout the tracheobronchial tree.  Airway exam revealed normal trachea and a normal tracheobronchial tree on the right;  There were thick, copious secretions in the left mainstem which were suctioned and sent for culture.  The left airway of the left lung was patent without obvious airway lesion after the bronchoscopy.  Procedures performed: therapeutic aspiration of the left mainstem  Specimens sent: respiratory culture, bronch aspirate  Condition post procedure: critically ill, on pressors and vent  Patient instructions: f/u culture result, continue pulm toilette  Complications: none  Yolonda Kida PCCM Pager: [phone] If no response, call [phone]

## 2011-07-07 NOTE — Consult Note (Signed)
[address]            [place] [address]          [phone]       [name] [facility] Medical Record #[id number] Date of Birth: 12/05/1940  Referring: Dr Lewie Loron,  Primary Care: Kerby Nora, MD, MD  Chief Complaint:   SOB   History of Present Illness:     Patient with progressive symptoms of sob and increasing edema since christmas. Notes christmas day episode of dizziness near syncope and diaphoresis.   Current Activity/ Functional Status: Patient is independent with mobility/ambulation, transfers, ADL's, IADL's.   Past Medical History  Diagnosis Date  . Thyroid disease     hypo  . Venous insufficiency   . Peripheral arterial disease   . Tobacco abuse   . COPD (chronic obstructive pulmonary disease)   . Arthritis   . Hypothyroidism     Past Surgical History  Procedure Date  . Cholecystectomy   . Angioplasty / stenting femoral   Bilateral lower extremity venous obliteration with laser several years ago  History  Smoking status  . Former Smoker -- 1.0 packs/day  . Types: Cigarettes  . Quit date: 04/27/2011  Smokeless tobacco  . Never Used  Comment: quit dec 2012    History  Alcohol Use No    History   Social History  . Marital Status: Married    Spouse Name: N/A    Number of Children: N/A  . Years of Education: N/A   Occupational History  . Not on file.   Social History Main Topics  . Smoking status: Former Smoker -- 1.0 packs/day    Types: Cigarettes    Quit date: 04/27/2011  . Smokeless tobacco: Never Used   Comment: quit dec 2012  . Alcohol Use: No  . Drug Use: No  . Sexually Active: Not Currently   Other Topics Concern  . Not on file   Social History Narrative   Exercsiing 3 times a week. Moderate diet control. O living will, no HCPOA.    No Known Allergies  Current Facility-Administered Medications  Medication Dose Route Frequency Provider Last Rate Last Dose  . 0.9 %  sodium  chloride infusion  1 mL/kg/hr Intravenous Continuous Roger A Arguello, PA-C 71.7 mL/hr at 07/07/11 1731 1 mL/kg/hr at 07/07/11 1731  . acetaminophen (TYLENOL) tablet 650 mg  650 mg Oral Q4H PRN [name], MD      . aspirin chewable tablet 324 mg  324 mg Oral Pre-Cath [name], MD   324 mg at 07/07/11 1055  . aspirin chewable tablet 81 mg  81 mg Oral Daily [name], MD   81 mg at 07/07/11 1055  . atorvastatin (LIPITOR) tablet 20 mg  20 mg Oral q1800 Roger A Arguello, PA-C   20 mg at 07/07/11 1730  . heparin 2-0.9 UNIT/ML-% infusion           . heparin ADULT infusion 100 units/mL (25000 units/250 mL)  850 Units/hr Intravenous Continuous [name], MD      . levothyroxine (SYNTHROID, LEVOTHROID) tablet 200 mcg  200 mcg Oral Daily Roger A Arguello, PA-C      . levothyroxine (SYNTHROID, LEVOTHROID) tablet 25 mcg  25 mcg Oral QODAY Roger A Arguello, PA-C      . lidocaine (XYLOCAINE) 1 % injection           .  metoprolol tartrate (LOPRESSOR) tablet 12.5 mg  12.5 mg Oral BID Roger A Arguello, PA-C      . midazolam (VERSED) 2 MG/2ML injection           . nitroGLYCERIN (NITROSTAT) SL tablet 0.4 mg  0.4 mg Sublingual Q5 Min x 3 PRN [name], MD      . nitroGLYCERIN (NTG ON-CALL) 0.2 mg/mL injection           . ondansetron (ZOFRAN) injection 4 mg  4 mg Intravenous Q6H PRN [name], MD        Prescriptions prior to admission  Medication Sig Dispense Refill  . aspirin 81 MG tablet Take 81 mg by mouth daily.        . furosemide (LASIX) 40 MG tablet Take 1 tablet (40 mg total) by mouth daily.  30 tablet  3  . levothyroxine (SYNTHROID, LEVOTHROID) 200 MCG tablet Take 200 mcg by mouth daily. Take in addition to 25 mcg daily      . levothyroxine (SYNTHROID, LEVOTHROID) 25 MCG tablet Take 25 mcg by mouth every other day.      . potassium chloride (K-DUR) 10 MEQ tablet Take 1 tablet (10 mEq total) by mouth 2 (two) times daily.  60 tablet  6    Family History    Problem Relation Age of Onset  . Alcohol abuse Father   . Cirrhosis Father   . Hypothyroidism Sister      Review of Systems:     Cardiac Review of Systems: Y or N  Chest Pain Cove.Etienne   ]  Resting SOB Cove.Etienne   ] Exertional SOB  Cove.Etienne  ]  [place].Mounts [ y ]   Pedal Edema [ severe  ]    Palpitations [ y ] Syncope  Cove.Etienne  ]   Presyncope [ y  ]  General Review of Systems: [Y] = yes [  ]=no Constitional: recent weight change [ y ]; anorexia [ y ]; fatigue [ y ]; nausea [  y]; night sweats [  ]; fever [ y ]; or chills [  ];                                                                                                                                          Dental: poor dentition[  ]  Eye : blurred vision [  ]; diplopia [   ]; vision changes [  ];  Amaurosis fugax[  ]; Resp: cough [ y ];  wheezing[ y ];  hemoptysis[n  ]; shortness of breath[ y ]; paroxysmal nocturnal dyspnea[y  ]; dyspnea on exertion[y  ]; or orthopnea[  ];  GI:  gallstones[  ], vomiting[  ];  dysphagia[  ]; melena[  ];  hematochezia [  ]; heartburn[  ];   Hx of  Colonoscopy[  ]; GU: kidney stones [  ]; hematuria[  ];  dysuria [  ];  nocturia[  ];  history of     obstruction [  ];             Skin: rash, swelling[  ];, hair loss[  ];  peripheral edema[  ];  or itching[  ]; Musculosketetal: myalgias[ n ];  joint swelling[ n ];  joint erythema[ n ];  joint pain[  ];  back pain[  ];  Heme/Lymph: bruising[  ];  bleeding[  ];  anemia[  ];  Neuro: TIA[n  ];  headaches[n  ];  stroke[n  ];  vertigo[  ];  seizures[ n ];   paresthesias[  n];  difficulty walking[  ];  Psych:depression[ n ]; anxiety[ n ];  Endocrine: diabetes[  ];  thyroid dysfunction[  ];  Immunizations: Flu Milo.Brash  ]; Pneumococcal[ n];  Other:  Physical Exam: BP 157/80  Pulse 84  Temp(Src) 98.1 F (36.7 C) (Oral)  Resp 18  Ht 5\' 9"  (1.753 m)  Wt 158 lb (71.668 kg)  BMI 23.33 kg/m2  SpO2 95%  General appearance: alert, cooperative and mild distress Neurologic: intact Heart:  regular rate and rhythm, S1, S2 normal, no murmur, click, rub or gallop Lungs: diminished breath sounds bilaterally Abdomen: soft, non-tender; bowel sounds normal; no masses,  no organomegaly Extremities: varicose veins noted, venous stasis dermatitis noted and bilaterial vein slelesosis no carotid bruits Rt cath site site ok  Diagnostic Studies & Laboratory data:     Recent Radiology Findings:  Dg Chest 2 View  06/29/2011  *RADIOLOGY REPORT*  Clinical Data: Shortness of breath.  CHEST - 2 VIEW  Comparison: 05/23/2011  Findings: Right lung is clear.  Flame shaped opacity overlying the left heart border persists with left-sided pleural thickening versus small left pleural effusion. Cardiopericardial silhouette is at upper limits of normal for size. Bones are diffusely demineralized.  IMPRESSION: Persistent flame shaped density in the left lower lung with associated left-sided pleural changes/fluid.  Given the lack of interval change, neoplasm is now more a concern.  CT chest with contrast recommended to further evaluate.  Original Report Authenticated By: ERIC A. MANSELL, M.D.   Ct Chest W Contrast  07/01/2011  *RADIOLOGY REPORT*  Clinical Data: Evaluate lung mass.  Potential neoplasm.  CT CHEST WITH CONTRAST  Technique:  Multidetector CT imaging of the chest was performed following the standard protocol during bolus administration of intravenous contrast.  Contrast: 80mL OMNIPAQUE IOHEXOL 300 MG/ML IJ SOLN  Comparison: None  Findings: No supraclavicular adenopathy.  No axillary adenopathy.  Enlarged precarinal lymph node measures 1 cm, image 23.  There is a lymph node adjacent to the ascending aorta which measures 1.1 cm, image 23.  There are calcifications involving the RCA, LAD, and left circumflex coronary arteries  Bilateral hilar lymph nodes are identified which appear borderline enlarged.  Small bilateral pleural effusions are noted left greater than right.  There is advanced emphysema.  Loculated  fluid is identified within the inner lobar fissure of the left lung, image 39.  Focal area of subpleural consolidation is noted within the periphery of the right upper lobe, image 30. This is nonspecific.  Mild multilevel spondylosis is identified within the thoracic spine.  IMPRESSION:  1.  The opacity identified on chest radiograph corresponds to a focal area of loculated fluid along the interlobar fissure of the left lung.  2.  There is borderline enlarged mediastinal and bilateral hilar adenopathy. Nonspecific.  Differential considerations include granulomatous inflammation/infection, metastatic adenopathy, or lymphoma. 3.  Left pleural effusion 4.  Advanced emphysema. 5.  There is atherosclerosis of the thoracic aorta, the great vessels of the mediastinum and the coronary arteries, including calcified atherosclerotic plaque in the RCA, LAD and left circumflex coronary arteries.  Atherosclerosis, including three-vessel coronary artery disease. Please note that although the presence of coronary artery calcium documents the presence of coronary artery disease, the severity of this disease and any potential stenosis cannot be assessed on this non-gated CT examination.  Assessment for potential risk factor modification, dietary therapy or pharmacologic therapy may be warranted, if clinically indicated.  Original Report Authenticated By: Rosealee Albee, M.D.   Recent Lab Findings: Lab Results  Component Value Date   WBC 7.2 06/29/2011   HGB 14.2 06/29/2011   HCT 43.0 06/29/2011   PLT 286.0 06/29/2011   GLUCOSE 83 07/07/2011   CHOL 200 12/22/2010   TRIG 85.0 12/22/2010   HDL 65.90 12/22/2010   LDLCALC 117* 12/22/2010   ALT 17 06/29/2011   AST 19 06/29/2011   NA 136 07/07/2011   K 4.0 07/07/2011   CL 101 07/07/2011   CREATININE 1.13 07/07/2011   BUN 25* 07/07/2011   CO2 25 07/07/2011   TSH 1.033 04/22/2011   INR 1.19 07/07/2011   ECHO: *[facility]* [address] [place], [place]  [address] [phone]  ------------------------------------------------------------ Transthoracic Echocardiography  Patient: [place], Spangler MR #: [id number] Study Date: 07/05/2011 Gender: M Age: 71 Height: 175.3cm Weight: 76.2kg BSA: 1.53m^2 Pt. Status: Room:  SONOGRAPHER Missy Al-Rammal, RVT, RDCS PERFORMING [place],  ATTENDING [place], Timothy ORDERING [place], Timothy REFERRING Julien Nordmann REFERRING [place] cc:  ------------------------------------------------------------ LV EF: 20% - 25%  ------------------------------------------------------------ History: PMH: Dyspnea. Chronic obstructive pulmonary disease. Risk factors: Patient c/o severe SOB which initially began in 02/2011, worsened over Christmas, and developed lower extremity edema. Treated for COPD and pneumonia, but worsened until he could not perform any activities without gasping for breath. He feels much improved after 2 days of diuretics, although remains severely dyspneic. Current tobacco use. Hypertension. Dyslipidemia.  ------------------------------------------------------------ Study Conclusions  - Left ventricle: The cavity size was mildly dilated. There was mild concentric hypertrophy. Systolic function was severely reduced. The estimated ejection fraction was in the range of 20% to 25%. Diffuse hypokinesis. Regional wall motion abnormalities cannot be excluded. Doppler parameters are consistent with abnormal left ventricular relaxation (grade 1 diastolic dysfunction). - Aortic valve: Moderately to severely calcified annulus. Trileaflet; normal thickness leaflets. Cusp separation was moderately reduced. There was no significant stenosis. - Mitral valve: Mild to moderate regurgitation. - Left atrium: The atrium was mildly dilated. - Right ventricle: Systolic function was mildly reduced. - Pulmonary arteries: PA peak pressure: 58mm Hg (S). RVSP consistent with moderate pulmonary  HTN. Transthoracic echocardiography. M-mode, complete 2D, spectral Doppler, and color Doppler. Height: Height: 175.3cm. Height: 69in. Weight: Weight: 76.2kg. Weight: 167.7lb. Body mass index: BMI: 24.8kg/m^2. Body surface area: BSA: 1.20m^2. Blood pressure: 122/66. Patient status: Outpatient.  ------------------------------------------------------------  ------------------------------------------------------------ Left ventricle: The cavity size was mildly dilated. There was mild concentric hypertrophy. Systolic function was severely reduced. The estimated ejection fraction was in the range of 20% to 25%. Diffuse hypokinesis. Regional wall motion abnormalities cannot be excluded. Doppler parameters are consistent with abnormal left ventricular relaxation (grade 1 diastolic dysfunction).  ------------------------------------------------------------ Aortic valve: Moderately to severely calcified annulus. Trileaflet; normal thickness leaflets. Cusp separation was moderately reduced. Mobility was not restricted. Doppler: There was no significant stenosis. No regurgitation. Peak velocity ratio of LVOT to aortic valve: 0.62. Valve area: 2.37cm^2 (  Vmax). Mean gradient: 7mm Hg (S). Peak gradient: 13mm Hg (S).  ------------------------------------------------------------ Aorta: Aortic root: The aortic root was normal in size.  ------------------------------------------------------------ Mitral valve: Structurally normal valve. Mobility was not restricted. Doppler: Transvalvular velocity was within the normal range. There was no evidence for stenosis. Mild to moderate regurgitation. Valve area by pressure half-time: 7.86cm^2. Indexed valve area by pressure half-time: 4.09cm^2/m^2. Peak gradient: 5mm Hg (D).  ------------------------------------------------------------ Left atrium: The atrium was mildly dilated.  ------------------------------------------------------------ Right  ventricle: The cavity size was normal. Wall thickness was normal. Systolic function was mildly reduced.  ------------------------------------------------------------ Pulmonic valve: The valve appears to be grossly normal. Doppler: Transvalvular velocity was within the normal range. There was no evidence for stenosis. Trivial regurgitation.  ------------------------------------------------------------ Tricuspid valve: Structurally normal valve. Doppler: Transvalvular velocity was within the normal range. Mild regurgitation.  ------------------------------------------------------------ Pulmonary artery: The main pulmonary artery was normal-sized. Systolic pressure was elevated consistent with moderate pulmonary HTN.  ------------------------------------------------------------ Right atrium: The atrium was normal in size.  ------------------------------------------------------------ Pericardium: There was no pericardial effusion.  ------------------------------------------------------------ Systemic veins: Inferior vena cava: The vessel was normal in size; the respirophasic diameter changes were blunted (< 50%); findings are consistent with elevated central venous pressure.  ------------------------------------------------------------  2D measurements Normal Doppler measurements Norma Left ventricle l LVID ED, 57.2 mm 43-52 Main pulmonary artery chord, Pressure, 58 mm Hg =30 PLAX S LVID ES, 50.2 mm 23-38 LVOT chord, Peak vel, 114 cm/s ----- PLAX S FS, chord, 12 % >29 Peak 5 mm Hg ----- PLAX gradient, LVPW, ED 10.1 mm ------ S IVS/LVPW 1.24 <1.3 Aortic valve ratio, ED Peak vel, 183 cm/s ----- Vol ED, 130 ml ------ S MOD1 Mean vel, 126 cm/s ----- Vol ES, 93 ml ------ S MOD1 VTI, S 30. cm ----- EF, MOD1 28 % ------ 9 Vol index, 68 ml/m^2 ------ Mean 7 mm Hg ----- ED, MOD1 gradient, Vol index, 48 ml/m^2 ------ S ES, MOD1 Peak 13 mm Hg ----- Vol ED, 128 ml ------  gradient, MOD2 S Vol ES, 82 ml ------ Peak vel 0.6 ----- MOD2 ratio, 2 EF, MOD2 36 % ------ LVOT/AV Stroke 46 ml ------ Area, Vmax 2.3 cm^2 ----- vol, MOD2 7 Vol index, 67 ml/m^2 ------ Mitral valve ED, MOD2 Peak E vel 115 cm/s ----- Vol index, 43 ml/m^2 ------ Peak A vel 24. cm/s ----- ES, MOD2 7 Stroke 24 ml/m^2 ------ Pressure 28 ms ----- index, half-time MOD2 Peak 5 mm Hg ----- Ventricular septum gradient, IVS, ED 12.5 mm ------ D LVOT Peak E/A 4.7 ----- Diam, S 22 mm ------ ratio Area 3.8 cm^2 ------ Area (PHT) 7.8 cm^2 ----- Aorta 6 Root diam, 31 mm ------ Area index 4.0 cm^2/m^2 ----- ED (PHT) 9 Left atrium Regurg 15. cm/s ----- AP dim 41 mm ------ alias vel, 4 AP dim 2.14 cm/m^2 <2.2 PISA index Max regurg 516 cm/s ----- Vol, S 53 ml ------ vel Vol index, 27.6 ml/m^2 ------ Regurg VTI 154 cm ----- S ERO, PISA 0.2 cm^2 ----- Regurg 31 ml ----- M-mode measurements Normal vol, PISA Left ventricle Tricuspid valve LVID, ED 57.6 mm 37-56 Regurg 345 cm/s ----- LVID, ES 48 mm ------ peak vel FS 17 % 29-45 Peak RV-RA 48 mm Hg ----- Vol ED, 164 ml ------ gradient, Teichholz S Vol ES, 108 ml ------ Max regurg 345 cm/s ----- Teichholz vel EF, 34.1 % 64-83 Pulmonic valve Teichholz Peak vel, 69. cm/s ----- Vol index 85 ml/m^2 ------ S 2 ED, Regurg 118 cm/s ----- Dian Queen, ED Vol index  56 ml/m^2 ------ ES, Teichholz  ------------------------------------------------------------ Prepared and Electronically Authenticated by  Dossie Arbour, MD, [facility] [id number]:[id number] Name: [name]  MRN: [id number]  DOB: 1940-10-26  Procedure: Left Heart Cath, Selective Coronary Angiography, LV angiography  Indication: SOB, dilated cardiomyopathy on echo, EF <25%, Chest tightness, fatigue, edema  Procedural details: The right groin was prepped, draped, and anesthetized with 1% lidocaine. Using modified Seldinger technique, a 5 French sheath was introduced into the right femoral  artery. French catheter was placed into the right femoral vein. Standard Judkins catheters were used for coronary angiography and left ventriculography. Catheter exchanges were performed over a guidewire. Theone Murdoch catheter used and advanced to the wedge position to obtain pressures. PA or aortic sats obtained to calculate C.I and C.O. 5 French Pigtail catheter was advanced across the aortic valve and LV gram imaged and pressures recorded. There were no immediate procedural complications. The patient was transferred to the post catheterization recovery area for further monitoring.  Procedural Findings:  Hemodynamics:  AO 138/67  LV 161/11  RA: 9/7 Mean 6  RV: 45/5, mean 7  PA: 47/24, mean 34  PCWP: 22, mean 19  Coronary angiography:  Coronary dominance: Right  Left mainstem: Large vessel with severe proximal and distal disease estimated at 90%. Disease extends into the proximal LAD.  Left anterior descending (LAD): Large vessel with severe ostial and proximal disease estimated at 90%.  Severe proximal diagonal disease #2,  Left circumflex (LCx): Severe ostial to proximal LCX disease estimated at 95%. OM1/ramus has a high take-off with moderate to severe ostial disease.  Right coronary artery (RCA): Moderate to large vessel with PDA/PL. Moderate to severe ostial and proximal RCA disease estimated at 70%. There is severe mid RCA disease estimated at 95%.  Left ventriculography: Left ventricular systolic function is severely depressed, LVEF is estimated at 20 to 25%, there is mild mitral regurgitation, no significant aortic valve stenosis.  Final Conclusions: Severe proximal and distal left main disease. Severe ostial LAD disease and proximal LCX disease. Severe mid RCA disease. LV gram showing global LV hypokinesis, severely depressed EF, estimated at 20 to 25%. Elevated wedge pressures of 22 (mean 19). Moderate ostial/proximal left subclavian disease, at least moderate mid left subclavian disease.   Recommendations: Given the extent and critical nature of his disease (severe Left main, severe LAD, LCX and RCA) , the extent of his symptoms over the past few weeks, particularly this past week, he will be admitted, started on heparin infusion tonight (8 hours post cardiac cath). Will continue low dose asprin. Will consult cardiac surgery for CABG. Case discussed with CVTS.  Julien Nordmann  07/07/2011, 3:01 PM       Assessment / Plan:   Sever 3 vessel CAD with left main and 20 % EF by echo (done in [place])  Presents with heart failure and anasarca ? Limited conduits for CABG with laser obliteration Family to get records from vein center in [place] Discussed CABG , high risk Monday Heart failure team to see and pulmonary to see preop   [name] MD  Beeper [phone] Office [phone] 07/07/2011 6:23 PM

## 2011-07-07 NOTE — Progress Notes (Addendum)
Pre-op Cardiac Surgery  Carotid Findings:  Carotid Dopplers done at [facility] 1/13.  Right 40-59% ICA stenosis (277/17).  Vertebral flow is antegrade.  Left: 40-59% ICA stenosis (144/28).  Vertebral artery flow is to fro.  Upper Extremity Right Left  Brachial Pressures 131 tri 101 bi  Radial Waveforms tri tri  Ulnar Waveforms tri mono  Palmar Arch (Allen's Test)     Findings:  Right:  Doppler obliterates with radial compression; normal with ulnar compression. Left:  Doppler obliterates with radial compression; normal with ulnar compression.    Lower  Extremity Right Left  Dorsalis Pedis    Anterior Tibial Bilateral palpable pulses.   Posterior Tibial    Ankle/Brachial Indices      Candace Elliott, RVS Terance Hart RVT 07/08/2011 2:09 PM

## 2011-07-07 NOTE — Progress Notes (Signed)
ANTICOAGULATION CONSULT NOTE - Initial Consult  Pharmacy Consult for Heparin Indication: ACS/STEMI;  3vCAD  No Known Allergies  Patient Measurements: Height: 5\' 9"  (175.3 cm) Weight: 158 lb (71.668 kg) IBW/kg (Calculated) : 70.7  Heparin Dosing Weight: 71.6 kg  Vital Signs: Temp: 98.1 F (36.7 C) (03/14 1638) Temp src: Oral (03/14 1638) BP: 157/80 mmHg (03/14 1019) Pulse Rate: 84  (03/14 1019)  Labs:  Basename 07/07/11 1222 07/07/11 1047  HGB -- --  HCT -- --  PLT -- --  APTT -- --  LABPROT -- 15.4*  INR -- 1.19  HEPARINUNFRC -- --  CREATININE 1.13 --  CKTOTAL -- --  CKMB -- --  TROPONINI -- --   Estimated Creatinine Clearance: 60.8 ml/min (by C-G formula based on Cr of 1.13).  Medical History: Past Medical History  Diagnosis Date  . Thyroid disease     hypo  . Venous insufficiency   . Peripheral arterial disease   . Tobacco abuse   . COPD (chronic obstructive pulmonary disease)   . Arthritis   . Hypothyroidism     Medications:  Prescriptions prior to admission  Medication Sig Dispense Refill  . aspirin 81 MG tablet Take 81 mg by mouth daily.        . furosemide (LASIX) 40 MG tablet Take 1 tablet (40 mg total) by mouth daily.  30 tablet  3  . levothyroxine (SYNTHROID, LEVOTHROID) 200 MCG tablet Take 200 mcg by mouth daily. Take in addition to 25 mcg daily      . levothyroxine (SYNTHROID, LEVOTHROID) 25 MCG tablet Take 25 mcg by mouth every other day.      . potassium chloride (K-DUR) 10 MEQ tablet Take 1 tablet (10 mEq total) by mouth 2 (two) times daily.  60 tablet  6   Scheduled:    . aspirin      . aspirin  324 mg Oral Pre-Cath  . aspirin  81 mg Oral Daily  . atorvastatin  20 mg Oral q1800  . heparin      . levothyroxine  200 mcg Oral Daily  . levothyroxine  25 mcg Oral QODAY  . lidocaine      . metoprolol tartrate  12.5 mg Oral BID  . midazolam      . nicotine  14 mg Transdermal Daily  . nitroGLYCERIN       IAssessment: 71 y.o. Male s/p  cardiac catherization today. 3 vessel CAD. Starting IV heparin for ACS/STEMI. Awaiting CVTS consult regarding CABG. Start IV heparin 8 hours post sheath removal. No bleeding/hematoma reported.   Goal of Therapy:  Heparin level 0.3-0.7 units/ml   Plan:  Start IV heparin 8 hrs after sheath removal at rate of 850 units/hr (~12 units/kg/hr). No bolus.   Check 8 hr Heparin level and CBC. Daily heparin level/CBC.  Arman Filter, RPh 07/07/2011, 17:40

## 2011-07-07 NOTE — H&P (Signed)
[name] is a pleasant 71 year old gentleman with a history of hyperlipidemia, long smoking history for 60 years, stent to his left lower extremity for claudication followed by Dr. Evette Cristal, also with a vein ablation, who presents for routine followup. Recent carotid showed 40% bilateral disease, subclavian disease on the left   On his last clinic visit, he was noted to have abnormal EKG compared to prior study. Further workup was ordered and he did not want anything done at the time. He had a previous episode of dizziness several months ago felt secondary to a noncardiac issue, possibly in her ear.   He reports that over the past several weeks, he has had worsening shortness of breath. He reports being unable to breath last night. He can not lie flat and has to sleep sitting up. He has been treated for pneumonia with antibiotics x2. Recent chest x-ray concerning for mass in the left lower lobe and he is scheduled for CT scan of the chest tomorrow. Shortness of breath has been severe and down with pitting edema that has developed over the past several weeks with 10 pound weight gain. Started on Lasix yesterday with mild diuresis. He does not see much improvement with inhalers. No further dizziness.   EKG shows normal sinus rhythm with a rate 86 beats per minute with ST and T wave abnormality in leads V4 through V6, Leads 2, 3, aVF    Outpatient Encounter Prescriptions as of 04/25/2011   Medication  Sig  Dispense  Refill   .  aspirin 81 MG tablet  Take 81 mg by mouth daily.     Marland Kitchen  levothyroxine (SYNTHROID, LEVOTHROID) 200 MCG tablet  One tablet daily, take in addition to 200 mcg daily  30 tablet  11   .  DISCONTD: levothyroxine (SYNTHROID, LEVOTHROID) 25 MCG tablet  One tablet daily, take in addition to 200 mcg daily  30 tablet  11    Review of Systems  Constitutional: Positive for unexpected weight change.  HENT: Negative.  Eyes: Negative.  Respiratory: Positive for cough and shortness of  breath.  Cardiovascular: Positive for leg swelling.  Gastrointestinal: Negative.  Musculoskeletal: Negative.  Skin: Negative.  Hematological: Negative.  Psychiatric/Behavioral: Negative.  All other systems reviewed and are negative.   Social Hx:  He has smoked most of his life for 60 years, currently smoking several cigarettes per day. He reports that he does not drink alcohol or use illicit drugs.  Family history  Alcohol abuse in his father and Cirrhosis in his father.  BP 157/88  Pulse 87  Ht 5\' 9"  (1.753 m)  Wt 168 lb (76.204 kg)  BMI 24.81 kg/m2   Physical Exam  Nursing note and vitals reviewed.  Constitutional: He is oriented to person, place, and time. He appears well-developed and well-nourished.  HENT:  Head: Normocephalic.  Nose: Nose normal.  Mouth/Throat: Oropharynx is clear and moist.  Eyes: Conjunctivae are normal. Pupils are equal, round, and reactive to light.  Neck: Normal range of motion. Neck supple. No JVD present. Carotid bruit is present.  Cardiovascular: Normal rate, regular rhythm, S1 normal, S2 normal, normal heart sounds and intact distal pulses. Exam reveals no gallop and no friction rub.  No murmur heard.  Pulses:  Carotid pulses are 2+ on the right side, and 2+ on the left side.  Radial pulses are 2+ on the right side, and 1+ on the left side.  Femoral pulses are 1+ on the right side, and 1+ on the left  side.  Dorsalis pedis pulses are 2+ on the right side, and 0 on the left side.  Posterior tibial pulses are 2+ on the right side, and 0 on the left side.  2+ pitting edema  Pulmonary/Chest: He is in respiratory distress. He has decreased breath sounds in the right lower field and the left lower field. He has no wheezes. He has no rales. He exhibits no tenderness.  Abdominal: Soft. Bowel sounds are normal. He exhibits no distension. There is no tenderness.  Musculoskeletal: Normal range of motion. He exhibits edema. He exhibits no tenderness.    Lymphadenopathy:  He has no cervical adenopathy.  Neurological: He is alert and oriented to person, place, and time. Coordination normal.  Skin: Skin is warm and dry. No rash noted. No erythema.  Psychiatric: He has a normal mood and affect. His behavior is normal. Judgment and thought content normal.      Assessment and Plan   SOB (shortness of breath) -  Symptoms of shortness of breath or severe. Worsening lower extreme edema. Clinical presentation is concerning for worsening heart failure, possibly systolic. Unable to exclude PE. He has been on Lasix for one day and we have suggested he continue on Lasix twice a day with potassium. He has a CT scan of the chest scheduled for tomorrow. Possible left lower lung mass seen on chest x-ray. This would also help exclude a PE. I suggested he go to the emergency room if symptoms get worse.  Unable to exclude underlying severe coronary artery disease and angina. We will schedule a echocardiogram and cardiac catheterization for next week. Again if symptoms get worse, we have suggested he go to the emergency room. Risks and benefits of the procedure/catheterization were discussed with the patient. Previous Version  Subclavian arterial stenosis -  Subclavian stenosis seen on recent carotid ultrasound. We will manage his other issues first at this time including his shortness of breath, fluid retention.  TOBACCO ABUSE - We have encouraged him to continue to work on weaning his cigarettes and smoking cessation. He will continue to work on this and does not want any assistance with chantix.   UNSPECIFIED PERIPHERAL VASCULAR DISEASE -  Mild-to-moderate carotid arterial disease bilaterally. Encouraged smoking cessation.

## 2011-07-07 NOTE — Op Note (Addendum)
Cardiac Catheterization Procedure Note  Name: [name] MRN: [id number] DOB: June 17, 1940  Procedure: Left Heart Cath, Selective Coronary Angiography, LV angiography  Indication:  SOB, dilated cardiomyopathy on echo, EF <25%, Chest tightness, fatigue, edema  Procedural details: The right groin was prepped, draped, and anesthetized with 1% lidocaine. Using modified Seldinger technique, a 5 French sheath was introduced into the right femoral artery.  French catheter was placed into the right femoral vein. Standard Judkins catheters were used for coronary angiography and left ventriculography. Catheter exchanges were performed over a guidewire. Theone Murdoch catheter used and advanced to the wedge position to obtain pressures. PA or aortic sats obtained to calculate C.I and C.O. 5 French  Pigtail catheter was advanced across the aortic valve and LV gram imaged and pressures recorded. There were no immediate procedural complications. The patient was transferred to the post catheterization recovery area for further monitoring.  Procedural Findings:  Hemodynamics:     AO 138/67    LV 161/11    RA: 9/7  Mean 6    RV:  45/5, mean 7    PA:  47/24, mean 34    PCWP:  22, mean 19   Coronary angiography:  Coronary dominance: Right  Left mainstem:   Large vessel with severe proximal and distal disease estimated at 90%. Disease extends into the proximal LAD.  Left anterior descending (LAD):   Large vessel with severe ostial and proximal disease estimated at 90%.     Severe proximal diagonal disease #2,   Left circumflex (LCx):  Severe ostial to proximal LCX disease estimated at 95%. OM1/ramus has a high take-off with moderate to severe ostial disease.   Right coronary artery (RCA):  Moderate to large vessel with PDA/PL. Moderate to severe ostial and proximal RCA disease estimated at 70%. There is severe mid RCA disease estimated at 95%.   Left ventriculography: Left ventricular systolic function is  severely depressed, LVEF is estimated at 20 to 25%, there is mild mitral regurgitation, no significant aortic valve stenosis.   Final Conclusions:  Severe proximal and distal left main disease. Severe ostial  LAD disease and proximal LCX disease. Severe mid RCA disease. LV gram showing global LV hypokinesis, severely depressed EF, estimated at 20 to 25%. Elevated wedge pressures of 22 (mean 19). Moderate ostial/proximal left subclavian disease, at least moderate mid left subclavian disease.   Recommendations: Given the extent and critical nature of his disease (severe Left main, severe LAD, LCX and RCA) , the extent of his symptoms over the past few weeks, particularly this past week, he will be admitted, started on heparin infusion tonight (8 hours post cardiac cath). Will continue low dose asprin. Will consult cardiac surgery for CABG. Case discussed with CVTS.   Julien Nordmann 07/07/2011, 3:01 PM

## 2011-07-08 ENCOUNTER — Other Ambulatory Visit: Payer: Self-pay

## 2011-07-08 ENCOUNTER — Inpatient Hospital Stay (HOSPITAL_COMMUNITY): Payer: Medicare Other

## 2011-07-08 DIAGNOSIS — Z01810 Encounter for preprocedural cardiovascular examination: Secondary | ICD-10-CM

## 2011-07-08 DIAGNOSIS — R0989 Other specified symptoms and signs involving the circulatory and respiratory systems: Secondary | ICD-10-CM

## 2011-07-08 DIAGNOSIS — I2510 Atherosclerotic heart disease of native coronary artery without angina pectoris: Principal | ICD-10-CM

## 2011-07-08 DIAGNOSIS — R0602 Shortness of breath: Secondary | ICD-10-CM

## 2011-07-08 DIAGNOSIS — I255 Ischemic cardiomyopathy: Secondary | ICD-10-CM

## 2011-07-08 LAB — PRO B NATRIURETIC PEPTIDE: Pro B Natriuretic peptide (BNP): 10309 pg/mL — ABNORMAL HIGH (ref 0–125)

## 2011-07-08 LAB — BASIC METABOLIC PANEL
Calcium: 9.8 mg/dL (ref 8.4–10.5)
Creatinine, Ser: 1.23 mg/dL (ref 0.50–1.35)
GFR calc Af Amer: 67 mL/min — ABNORMAL LOW (ref >90)

## 2011-07-08 LAB — CBC
MCH: 27.9 pg (ref 26.0–34.0)
MCHC: 32.1 g/dL (ref 30.0–36.0)
MCV: 87.1 fL (ref 78.0–100.0)
Platelets: 298 10*3/uL (ref 150–400)
RDW: 15.8 % — ABNORMAL HIGH (ref 11.5–15.5)

## 2011-07-08 LAB — TYPE AND SCREEN
ABO/RH(D): A NEG
Antibody Screen: NEGATIVE

## 2011-07-08 LAB — HEPARIN LEVEL (UNFRACTIONATED): Heparin Unfractionated: 0.17 IU/mL — ABNORMAL LOW (ref 0.30–0.70)

## 2011-07-08 MED ORDER — FUROSEMIDE 10 MG/ML IJ SOLN
40.0000 mg | Freq: Two times a day (BID) | INTRAMUSCULAR | Status: DC
  Administered 2011-07-08: 40 mg via INTRAVENOUS
  Filled 2011-07-08 (×3): qty 4

## 2011-07-08 MED ORDER — FUROSEMIDE 40 MG PO TABS
40.0000 mg | ORAL_TABLET | Freq: Two times a day (BID) | ORAL | Status: DC
  Administered 2011-07-08 – 2011-07-10 (×5): 40 mg via ORAL
  Filled 2011-07-08 (×7): qty 1

## 2011-07-08 MED ORDER — BISOPROLOL FUMARATE 5 MG PO TABS
2.5000 mg | ORAL_TABLET | Freq: Two times a day (BID) | ORAL | Status: DC
  Administered 2011-07-08 – 2011-07-10 (×6): 2.5 mg via ORAL
  Filled 2011-07-08 (×8): qty 0.5

## 2011-07-08 MED ORDER — BISACODYL 5 MG PO TBEC: 5.0000 mg | DELAYED_RELEASE_TABLET | Freq: Once | ORAL | Status: DC

## 2011-07-08 MED ORDER — TEMAZEPAM 15 MG PO CAPS: 15.0000 mg | ORAL_CAPSULE | Freq: Once | ORAL | Status: AC | PRN

## 2011-07-08 MED ORDER — METOPROLOL TARTRATE 12.5 MG HALF TABLET
12.5000 mg | ORAL_TABLET | Freq: Once | ORAL | Status: DC
  Filled 2011-07-08: qty 1

## 2011-07-08 MED ORDER — HEPARIN (PORCINE) IN NACL 100-0.45 UNIT/ML-% IJ SOLN
1400.0000 [IU]/h | INTRAMUSCULAR | Status: DC
  Administered 2011-07-08: 1300 [IU]/h via INTRAVENOUS
  Administered 2011-07-09 – 2011-07-10 (×2): 1400 [IU]/h via INTRAVENOUS
  Filled 2011-07-08 (×5): qty 250

## 2011-07-08 MED ORDER — LEVOTHYROXINE SODIUM 200 MCG PO TABS
200.0000 ug | ORAL_TABLET | Freq: Every day | ORAL | Status: DC
  Administered 2011-07-08 – 2011-07-10 (×3): 200 ug via ORAL
  Filled 2011-07-08 (×5): qty 1

## 2011-07-08 MED ORDER — NITROGLYCERIN IN D5W 200-5 MCG/ML-% IV SOLN
10.0000 ug/min | INTRAVENOUS | Status: DC
  Administered 2011-07-08: 10 ug/min via INTRAVENOUS
  Filled 2011-07-08: qty 250

## 2011-07-08 MED ORDER — CHLORHEXIDINE GLUCONATE 4 % EX LIQD
60.0000 mL | Freq: Once | CUTANEOUS | Status: AC
  Administered 2011-07-10: 4 via TOPICAL
  Filled 2011-07-08: qty 60

## 2011-07-08 MED ORDER — LISINOPRIL 5 MG PO TABS
5.0000 mg | ORAL_TABLET | Freq: Two times a day (BID) | ORAL | Status: DC
  Administered 2011-07-08 – 2011-07-10 (×6): 5 mg via ORAL
  Filled 2011-07-08 (×8): qty 1

## 2011-07-08 MED ORDER — HEPARIN BOLUS VIA INFUSION
2000.0000 [IU] | Freq: Once | INTRAVENOUS | Status: AC
  Administered 2011-07-08: 2000 [IU] via INTRAVENOUS
  Filled 2011-07-08: qty 2000

## 2011-07-08 MED ORDER — CHLORHEXIDINE GLUCONATE 4 % EX LIQD
60.0000 mL | Freq: Once | CUTANEOUS | Status: AC
  Administered 2011-07-11: 4 via TOPICAL

## 2011-07-08 MED ORDER — CAPTOPRIL 6.25 MG HALF TABLET
6.2500 mg | ORAL_TABLET | Freq: Three times a day (TID) | ORAL | Status: DC
  Administered 2011-07-08: 6.25 mg via ORAL
  Filled 2011-07-08 (×3): qty 1

## 2011-07-08 MED ORDER — CHLORHEXIDINE GLUCONATE 4 % EX LIQD
60.0000 mL | Freq: Once | CUTANEOUS | Status: DC
  Filled 2011-07-08: qty 60

## 2011-07-08 NOTE — Progress Notes (Signed)
Patient ID: [name], male   DOB: 06/25/1940, 71 y.o.   MRN: [id number]                   [address]            [place] [id number]          [phone]     TCTS DAILY PROGRESS NOTE                   [address]            [name] [id number]          [phone]      1 Day Post-Op Procedure(s) (LRB): LEFT AND RIGHT HEART CATHETERIZATION WITH CORONARY ANGIOGRAM ()  Total Length of Stay:  LOS: 1 day   Subjective: No chest pain, pft done. Records from [facility] suggest bil g saphenous ablation in 2006. In addition radial bil are primary supplier so not usable. Patient has 30mm lower pressure in the left arm compared to rt.  Objective: Vital signs in last 24 hours: Temp:  [97.4 F (36.3 C)-98.1 F (36.7 C)] 97.6 F (36.4 C) (03/15 1204) Pulse Rate:  [66-86] 78  (03/15 1200) Cardiac Rhythm:  [-] Normal sinus rhythm (03/15 1200) Resp:  [16-27] 16  (03/14 2004) BP: (102-146)/(33-87) 106/59 mmHg (03/15 1200) SpO2:  [93 %-98 %] 93 % (03/15 1200)  Filed Weights   07/07/11 1019  Weight: 158 lb (71.668 kg)    Weight change:        Intake/Output from previous day: 03/14 0701 - 03/15 0700 In: 667.5 [P.O.:200; I.V.:467.5] Out: 801 [Urine:800; Stool:1]  Intake/Output this shift: Total I/O In: 337.2 [P.O.:240; I.V.:93.2; IV Piggyback:4] Out: 500 [Urine:500]  Current Meds: Scheduled Meds:   . aspirin  81 mg Oral Daily  . atorvastatin  20 mg Oral q1800  . bisoprolol  2.5 mg Oral BID  . furosemide  40 mg Oral BID  . levothyroxine  200 mcg Oral QAC breakfast  . levothyroxine  25 mcg Oral QODAY  . lisinopril  5 mg Oral BID  . DISCONTD: captopril  6.25 mg Oral TID  . DISCONTD: furosemide  40 mg Intravenous BID  . DISCONTD: levothyroxine  200 mcg Oral Daily  . DISCONTD: metoprolol tartrate  12.5 mg Oral BID  . DISCONTD: nicotine  14 mg Transdermal Daily   Continuous Infusions:   . sodium chloride 1 mL/kg/hr (07/07/11 2000)  .  heparin 1,100 Units/hr (07/08/11 1500)  . nitroGLYCERIN 10 mcg/min (07/08/11 1500)  . DISCONTD: sodium chloride    . DISCONTD: heparin 850 Units/hr (07/08/11 1100)   PRN Meds:.acetaminophen, albuterol, ipratropium, nitroGLYCERIN, ondansetron (ZOFRAN) IV, DISCONTD: nitroGLYCERIN  General appearance: alert, cooperative and no distress Neurologic: intact Heart: regular rate and rhythm, S1, S2 normal, no murmur, click, rub or gallop Lungs: clear to auscultation bilaterally Abdomen: soft, non-tender; bowel sounds normal; no masses,  no organomegaly Extremities: extremities normal, atraumatic, no cyanosis or edema and Homans sign is negative, no sign of DVT  Lab Results: CBC: Basename 07/08/11 0918  WBC 6.5  HGB 16.2  HCT 50.5  PLT 298   BMET:  Basename 07/08/11 0918 07/07/11 1222  NA 138 136  K 4.7 4.0  CL 101 101  CO2 26 25  GLUCOSE 86 83  BUN 22 25*  CREATININE 1.23 1.13  CALCIUM 9.8 8.9    BNP:  today 10309   PT/INR:  Basename 07/07/11 1047  LABPROT 15.4*  INR 1.19   Radiology: No results found.   Assessment/Plan: S/P Procedure(s) (LRB): LEFT AND RIGHT HEART CATHETERIZATION WITH CORONARY ANGIOGRAM () Plan high risk CABG Monday, patient and wife are aware of risks and options, secondary to poor lv function, pulmonary disease, limited conduits.  The goals risks and alternatives of the planned surgical procedure CABG have been discussed with the patient in detail. The risks of the procedure including death, infection, stroke, myocardial infarction, bleeding, blood transfusion have all been discussed specifically.  I have quoted [name] a 10 % of perioperative mortality and a complication rate as high as 40 %. The patient's questions have been answered.[name] is willing  to proceed with the planned procedure.     [name] MD  Beeper [phone] Office [phone] 07/08/2011 3:35 PM

## 2011-07-08 NOTE — Progress Notes (Signed)
UR Completed. [name], [name] [phone]  

## 2011-07-08 NOTE — Progress Notes (Signed)
Heparin per pharmacy  Heparin level =0.11 on 1100 units/hr Goal heparin level= 0.3-0.7  Heparin level is subtherapeutic. It decreased from 0.17 to 0.11 even though the rate was increased from 950 units/hr to 1100 units/hr. Verified with RN that heparin was not interrupted. Will give 2000 unit bolus and increase the rate to 1300 units/hr. Will f/u heparin level with am labs.  Cardell Peach, Pharm D

## 2011-07-08 NOTE — Progress Notes (Signed)
  Patient Name: [name]      SUBJECTIVE: severe 3VCAD  For CABG  Also with EF 25%  Still with mod sob but no chest pain;; reportdedly wi anasarca but no edema  Past Medical History  Diagnosis Date  . Thyroid disease     hypo  . Venous insufficiency   . Peripheral arterial disease   . Tobacco abuse   . COPD (chronic obstructive pulmonary disease)   . Arthritis   . Hypothyroidism     PHYSICAL EXAM Filed Vitals:   07/08/11 0406 07/08/11 0500 07/08/11 0600 07/08/11 0747  BP:  115/66 124/84 121/76  Pulse:  76 80 84  Temp: 97.7 F (36.5 C)   97.4 F (36.3 C)  TempSrc: Oral   Oral  Resp:      Height:      Weight:      SpO2:  96% 98% 96%    Well developed and nourished in mod res distress HENT normal Neck supple with JVP-flat Clear but decreased Regular rate and rhythm, no murmurs or gallops Abd-soft with active BS No Clubbing cyanosis edema Skin-warm and dry A & Oriented  Grossly normal sensory and motor function  TELEMETRY: Reviewed telemetry pt in nsr:    Intake/Output Summary (Last 24 hours) at 07/08/11 0807 Last data filed at 07/08/11 0600  Gross per 24 hour  Intake 658.99 ml  Output    801 ml  Net -142.01 ml    LABS: Basic Metabolic Panel:  Lab 07/07/11 8657  NA 136  K 4.0  CL 101  CO2 25  GLUCOSE 83  BUN 25*  CREATININE 1.13  CALCIUM 8.9  MG --  PHOS --   Cardiac Enzymes: No results found for this basename: CKTOTAL:3,CKMB:3,CKMBINDEX:3,TROPONINI:3 in the last 72 hours CBC: No results found for this basename: WBC:7,NEUTROABS:7,HGB:7,HCT:7,MCV:7,PLT:7 in the last 168 hours PROTIME:  Basename 07/07/11 1047  LABPROT 15.4*  INR 1.19     ASSESSMENT AND PLAN:  Patient Active Hospital Problem List: CAD (coronary artery disease), native coronary artery (07/07/2011)   COPD, severe (01/08/2010)   Subclavian arterial stenosis (04/25/2011)   Ischemic cardiomyopathy (07/08/2011)  Await plans from TCTS  Hx of vein stripping and per vas  arterial disease   With ongoing SOB >> addNTG for ? Ischemic contribution  Never had CP Pulm input ongoing  ?? Did he really have bronch he doesn't recall          Signed, Sherryl Manges MD  07/08/2011

## 2011-07-08 NOTE — Progress Notes (Signed)
Right Lower Extremity Vein Map    Right Great Saphenous Vein   Segment Diameter Comment  1. Origin 6.6mm branch  2. High Thigh 5.20mm branch  3. Mid Thigh 4.38mm branch  4. Low Thigh 2.71mm branch  5. At Knee 3.66mm   6. High Calf 3.25mm   7. Low Calf 2.96mm   8. Ankle 3.39mm    mm    mm    mm    VASCULAR LAB PRELIMINARY  PRELIMINARY  PRELIMINARY  PRELIMINARY  Left Lower Extremity Vein Map    Left Great Saphenous Vein   Segment Diameter Comment  1. Origin 3.66mm   2. High Thigh 2.14mm   3. Mid Thigh 2.74mm branch  4. Low Thigh 2.72mm   5. At Knee 5.17mm branch  6. High Calf 4.20mm branch  7. Low Calf 3.74mm   8. Ankle 3.74mm    mm    mm    mm     Left Small Saphenous Vein  Segment Diameter Comment  1. Origin 2.8mm   2. High Calf 2mm   3. Mid Calf 2.79mm   4. Low Calf 2mm Small chronic partial thrombus   mm    mm    mm     Farrel Demark, 07/08/2011, 12:32 PM

## 2011-07-08 NOTE — Progress Notes (Signed)
Patient ID: Xyon Lukasik, male   DOB: 1940-11-23, 71 y.o.   MRN: [id number] Advanced Heart Failure Rounding Note   Subjective:    Mr. Bielefeld is a pleasant 71 year old gentleman with a history of hyperlipidemia, long smoking history for 60 years, COPD, stent to his left lower extremity for claudication followed by Dr. Evette Cristal, also with a vein ablation. Started feeling bad January 2013 with SOB, PND/Orhtopnea and lower extremity edema. Initially treated for pneumonia x 2 with no improvement. ? Mass noted in  LLL. (will need CT of chest at some point). He recently started on Lasix 40 mg daily and his dyspnea improved.    3/14 LHC Severe proximal and distal left main disease. Severe ostial LAD disease and proximal LCX disease. Severe mid RCA disease. LV gram showing global LV hypokinesis, severely depressed EF, estimated at 20 to 25%. Elevated wedge pressures of 22 (mean 19). Moderate ostial/proximal left subclavian disease, at least moderate mid left subclavian disease.  AO 138/67 LV 161/11 RA: 9/7 Mean 6 RV: 45/5, mean 7 PA: 47/24, mean 34 PCWP: 22, mean 19   Short of breath still but has improved with diuresis. Denies CP        Objective:    Vital Signs:   Temp:  [96.7 F (35.9 C)-98.1 F (36.7 C)] 97.4 F (36.3 C) (03/15 0747) Pulse Rate:  [73-88] 84  (03/15 0747) Resp:  [16-27] 16  (03/14 2004) BP: (105-157)/(63-87) 121/76 mmHg (03/15 0747) SpO2:  [93 %-98 %] 96 % (03/15 0747) Weight:  [71.668 kg (158 lb)] 71.668 kg (158 lb) (03/14 1019)    24-hour weight change: Weight change:   Intake/Output:   Intake/Output Summary (Last 24 hours) at 07/08/11 0808 Last data filed at 07/08/11 0600  Gross per 24 hour  Intake 658.99 ml  Output    801 ml  Net -142.01 ml     Physical Exam: General:  Chronically ill appearing.  HEENT: normal Neck: supple. JVP 7-8 cm. Carotids 2+ bilat; no bruits. No lymphadenopathy or thryomegaly appreciated. Cor: PMI nondisplaced. Regular rate & rhythm.  No rubs, gallops or murmurs. Lungs: Decreased breath sounds bilaterally Abdomen: soft, nontender, nondistended. No hepatosplenomegaly. No bruits or masses. Good bowel sounds. Extremities: no cyanosis, clubbing, rash, edema Neuro: alert & orientedx3, cranial nerves grossly intact. moves all 4 extremities w/o difficulty. Affect pleasant  Telemetry: Sinus Rhythm  Labs: Basic Metabolic Panel:  Lab 07/07/11 4098  NA 136  K 4.0  CL 101  CO2 25  GLUCOSE 83  BUN 25*  CREATININE 1.13  CALCIUM 8.9  MG --  PHOS --    Liver Function Tests: No results found for this basename: AST:5,ALT:5,ALKPHOS:5,BILITOT:5,PROT:5,ALBUMIN:5 in the last 168 hours No results found for this basename: LIPASE:5,AMYLASE:5 in the last 168 hours No results found for this basename: AMMONIA:3 in the last 168 hours  CBC: No results found for this basename: WBC:5,NEUTROABS:5,HGB:5,HCT:5,MCV:5,PLT:5 in the last 168 hours  Cardiac Enzymes: No results found for this basename: CKTOTAL:5,CKMB:5,CKMBINDEX:5,TROPONINI:5 in the last 168 hours  BNP: No components found with this basename: POCBNP:5  CBG: No results found for this basename: GLUCAP:5 in the last 168 hours  Coagulation Studies:  Basename 07/07/11 1047  LABPROT 15.4*  INR 1.19    Other results: EKG:  Imaging:  No results found.   Medications:     Scheduled Medications:    . aspirin  324 mg Oral Pre-Cath  . aspirin  81 mg Oral Daily  . atorvastatin  20 mg Oral q1800  .  captopril  6.25 mg Oral TID  . furosemide  40 mg Intravenous BID  . heparin      . levothyroxine  200 mcg Oral QAC breakfast  . levothyroxine  25 mcg Oral QODAY  . lidocaine      . metoprolol tartrate  12.5 mg Oral BID  . midazolam      . nitroGLYCERIN      . DISCONTD: levothyroxine  200 mcg Oral Daily  . DISCONTD: nicotine  14 mg Transdermal Daily    Infusions:    . sodium chloride 1 mL/kg/hr (07/07/11 2000)  . heparin 850 Units/hr (07/08/11 0600)  .  DISCONTD: sodium chloride       PRN Medications:  acetaminophen, albuterol, ipratropium, nitroGLYCERIN, ondansetron (ZOFRAN) IV, DISCONTD: sodium chloride, DISCONTD: nitroGLYCERIN   Assessment:  1. CAD ischemic cardiomyopathy 3 vessel disease  2. Acute systolic heart failure EF 20-25% 3. COPD 4. A/C respiratory failure 5. Hypothyroid   Plan/Discussion:    Patient continues to have dyspnea with exertion but has improved considerably with diuresis.  He does not appear significantly volume overloaded on exam today, which fits his right heart cath where RA pressure and PCWP were not significantly elevated.  Pulmonary hypertension was likely in large part due to COPD.  He will be high risk for CABG due to significant COPD and systolic dysfunction.  - Change to po Lasix today, 40 mg po bid.  - lisinopril 5 mg bid - bisoprolol 2.5 mg bid for beta blockade (more beta-1 selective than Coreg or metoprolol given COPD).  - Can continue low dose NTG gtt for ischemia and heparin gtt   Length of Stay: 1  Marca Ancona 07/08/2011 11:23 AM

## 2011-07-08 NOTE — Progress Notes (Signed)
PCCM brief progress note  PFT's orderded, will help with pre-op risk stratification. We will review w you when available.  Levy Pupa, MD, PhD 07/08/2011, 12:11 PM [facility] [phone] or if no answer [phone]

## 2011-07-08 NOTE — Progress Notes (Signed)
ANTICOAGULATION CONSULT NOTE - Follow Up Consult  Pharmacy Consult for Heparin Indication: 3v CAD awaiting CABG  No Known Allergies  Vital Signs: Temp: 97.4 F (36.3 C) (03/15 0747) Temp src: Oral (03/15 0747) BP: 120/72 mmHg (03/15 1009) Pulse Rate: 84  (03/15 1009)  Labs:  Basename 07/08/11 0918 07/07/11 1222 07/07/11 1047  HGB 16.2 -- --  HCT 50.5 -- --  PLT 298 -- --  APTT -- -- --  LABPROT -- -- 15.4*  INR -- -- 1.19  HEPARINUNFRC 0.17* -- --  CREATININE 1.23 1.13 --  CKTOTAL -- -- --  CKMB -- -- --  TROPONINI -- -- --   Estimated Creatinine Clearance: 55.9 ml/min (by C-G formula based on Cr of 1.23).   Medications:  Heparin @ 850 units/hr  Assessment: 71yom continues on heparin for 3v CAD awaiting CABG. Initial heparin level is subtherapeutic. No bleeding noted.   Goal of Therapy:  Heparin level 0.3-0.7 units/ml   Plan:  1) Increase heparin to 1100 units/hr 2) 8h heparin level  Fredrik Rigger 07/08/2011,11:24 AM

## 2011-07-09 DIAGNOSIS — I5021 Acute systolic (congestive) heart failure: Secondary | ICD-10-CM

## 2011-07-09 LAB — BASIC METABOLIC PANEL
BUN: 26 mg/dL — ABNORMAL HIGH (ref 6–23)
CO2: 27 mEq/L (ref 19–32)
Calcium: 9.1 mg/dL (ref 8.4–10.5)
Chloride: 101 mEq/L (ref 96–112)
Creatinine, Ser: 1.45 mg/dL — ABNORMAL HIGH (ref 0.50–1.35)
Glucose, Bld: 90 mg/dL (ref 70–99)

## 2011-07-09 LAB — CBC
HCT: 41.2 % (ref 39.0–52.0)
Hemoglobin: 13.3 g/dL (ref 13.0–17.0)
MCH: 27.5 pg (ref 26.0–34.0)
MCV: 85.3 fL (ref 78.0–100.0)
RBC: 4.83 MIL/uL (ref 4.22–5.81)
WBC: 8 10*3/uL (ref 4.0–10.5)

## 2011-07-09 NOTE — Progress Notes (Signed)
ANTICOAGULATION CONSULT NOTE - Follow Up Consult  Pharmacy Consult for heparin Indication: 3V CAD awaiting CABG  Labs:  Basename 07/09/11 0500 07/08/11 1619 07/08/11 0918 07/07/11 1222 07/07/11 1047  HGB 13.3 -- 16.2 -- --  HCT 41.2 -- 50.5 -- --  PLT 257 -- 298 -- --  APTT -- -- -- -- --  LABPROT -- -- -- -- 15.4*  INR -- -- -- -- 1.19  HEPARINUNFRC 0.30 0.11* 0.17* -- --  CREATININE 1.45* -- 1.23 1.13 --  CKTOTAL -- -- -- -- --  CKMB -- -- -- -- --  TROPONINI -- -- -- -- --    Assessment: 71yo male now therapeutic on heparin while awaiting CABG though at very low end of goal and pt has been subtherapeutic since beginning heparin.  Goal of Therapy:  Heparin level 0.3-0.7 units/ml   Plan:  Will increase heparin gtt by ~1 unit/kg/hr to 1400 units/hr and check level in 8hr.  Colleen Can PharmD BCPS 07/09/2011,7:11 AM

## 2011-07-09 NOTE — Progress Notes (Signed)
Patient ID: [name], male   DOB: 05-31-1940, 71 y.o.   MRN: [id number]   Subjective:    [name] is a pleasant 71 year old gentleman with a history of hyperlipidemia, long smoking history for 60 years, COPD, stent to his left lower extremity for claudication followed by Dr. Evette Cristal, also with a vein ablation. Started feeling bad January 2013 with SOB, PND/Orhtopnea and lower extremity edema. Initially treated for pneumonia x 2 with no improvement. ? Mass noted in  LLL. (will need CT of chest at some point). He recently started on Lasix 40 mg daily and his dyspnea improved.    3/14 LHC Severe proximal and distal left main disease. Severe ostial LAD disease and proximal LCX disease. Severe mid RCA disease. LV gram showing global LV hypokinesis, severely depressed EF, estimated at 20 to 25%. Elevated wedge pressures of 22 (mean 19). Moderate ostial/proximal left subclavian disease, at least moderate mid left subclavian disease.  AO 138/67 LV 161/11 RA: 9/7 Mean 6 RV: 45/5, mean 7 PA: 47/24, mean 34 PCWP: 22, mean 19   Short of breath still but has improved with diuresis. Denies CP        Objective:    Vital Signs:   Temp:  [96.7 F (35.9 C)-98.1 F (36.7 C)] 97.4 F (36.3 C) (03/15 0747) Pulse Rate:  [73-88] 84  (03/15 0747) Resp:  [16-27] 16  (03/14 2004) BP: (105-157)/(63-87) 121/76 mmHg (03/15 0747) SpO2:  [93 %-98 %] 96 % (03/15 0747) Weight:  [71.668 kg (158 lb)] 71.668 kg (158 lb) (03/14 1019)    24-hour weight change: Weight change:   Intake/Output:   Intake/Output Summary (Last 24 hours) at 07/08/11 0808 Last data filed at 07/08/11 0600  Gross per 24 hour  Intake 658.99 ml  Output    801 ml  Net -142.01 ml     Physical Exam: General:  Chronically ill appearing.  HEENT: normal Neck: supple. JVP 7-8 cm. Carotids 2+ bilat; no bruits. No lymphadenopathy or thryomegaly appreciated. Cor: PMI nondisplaced. Regular rate & rhythm. No rubs, gallops or murmurs. Lungs:  Decreased breath sounds bilaterally Abdomen: soft, nontender, nondistended. No hepatosplenomegaly. No bruits or masses. Good bowel sounds. Extremities: no cyanosis, clubbing, rash, edema Neuro: alert & orientedx3, cranial nerves grossly intact. moves all 4 extremities w/o difficulty. Affect pleasant  Telemetry: Sinus Rhythm  Labs: Basic Metabolic Panel:  Lab 07/07/11 4098  NA 136  K 4.0  CL 101  CO2 25  GLUCOSE 83  BUN 25*  CREATININE 1.13  CALCIUM 8.9  MG --  PHOS --    Coagulation Studies:  Basename 07/07/11 1047  LABPROT 15.4*  INR 1.19    Other results: EKG: 3/7/  SR 83  Anterolateral T wave changes LAE     Medications:     Scheduled Medications:    . aspirin  324 mg Oral Pre-Cath  . aspirin  81 mg Oral Daily  . atorvastatin  20 mg Oral q1800  . captopril  6.25 mg Oral TID  . furosemide  40 mg Intravenous BID  . heparin      . levothyroxine  200 mcg Oral QAC breakfast  . levothyroxine  25 mcg Oral QODAY  . lidocaine      . metoprolol tartrate  12.5 mg Oral BID  . midazolam      . nitroGLYCERIN      . DISCONTD: levothyroxine  200 mcg Oral Daily  . DISCONTD: nicotine  14 mg Transdermal Daily    Infusions:    .  sodium chloride 1 mL/kg/hr (07/07/11 2000)  . heparin 850 Units/hr (07/08/11 0600)  . DISCONTD: sodium chloride       PRN Medications:  acetaminophen, albuterol, ipratropium, nitroGLYCERIN, ondansetron (ZOFRAN) IV, DISCONTD: sodium chloride, DISCONTD: nitroGLYCERIN   Assessment:  1. CAD ischemic cardiomyopathy 3 vessel disease  2. Acute systolic heart failure EF 20-25% 3. COPD 4. A/C respiratory failure 5. Hypothyroid   Plan/Discussion:    Patient continues to have dyspnea with exertion but has improved considerably with diuresis.  He does not appear significantly volume overloaded on exam today, which fits his right heart cath where RA pressure and PCWP were not significantly elevated.  Pulmonary hypertension was likely in  large part due to COPD.  He will be high risk for CABG due to significant COPD and systolic dysfunction.  Contine PO Lasix Lisinopril  Beta one selective bioprolol  CAD:  See note form Dr Tyrone Sage.  CABG Monday continue heparin and nitro  Length of Stay: 1  [name] 07/09/2011 8:39 AM

## 2011-07-09 NOTE — Progress Notes (Signed)
Report from Night RN. Chart reviewed together. Handoff complete.  

## 2011-07-09 NOTE — Progress Notes (Signed)
ANTICOAGULATION CONSULT NOTE - Follow Up Consult  Pharmacy Consult for heparin Indication: 3V CAD awaiting CABG  Labs:  Basename 07/09/11 1520 07/09/11 0500 07/08/11 1619 07/08/11 0918 07/07/11 1222 07/07/11 1047  HGB -- 13.3 -- 16.2 -- --  HCT -- 41.2 -- 50.5 -- --  PLT -- 257 -- 298 -- --  APTT -- -- -- -- -- --  LABPROT -- -- -- -- -- 15.4*  INR -- -- -- -- -- 1.19  HEPARINUNFRC 0.65 0.30 0.11* -- -- --  CREATININE -- 1.45* -- 1.23 1.13 --  CKTOTAL -- -- -- -- -- --  CKMB -- -- -- -- -- --  TROPONINI -- -- -- -- -- --    Assessment: 71yo male  on heparin while awaiting CABG. Heparin increased to 1400 units/hr this am and current level is at goal.  Goal of Therapy:  Heparin level 0.3-0.7 units/ml   Plan:  -No heparin changed needed  Benny Lennert PharmD BCPS 07/09/2011,4:26 PM

## 2011-07-10 ENCOUNTER — Encounter (HOSPITAL_COMMUNITY): Payer: Self-pay | Admitting: Anesthesiology

## 2011-07-10 DIAGNOSIS — J449 Chronic obstructive pulmonary disease, unspecified: Secondary | ICD-10-CM

## 2011-07-10 LAB — CBC
Hemoglobin: 13.4 g/dL (ref 13.0–17.0)
MCH: 27.5 pg (ref 26.0–34.0)
MCV: 85.4 fL (ref 78.0–100.0)
Platelets: 264 10*3/uL (ref 150–400)
RBC: 4.87 MIL/uL (ref 4.22–5.81)
WBC: 7.9 10*3/uL (ref 4.0–10.5)

## 2011-07-10 LAB — BASIC METABOLIC PANEL
CO2: 26 mEq/L (ref 19–32)
Calcium: 9.2 mg/dL (ref 8.4–10.5)
Chloride: 102 mEq/L (ref 96–112)
Creatinine, Ser: 1.54 mg/dL — ABNORMAL HIGH (ref 0.50–1.35)
Glucose, Bld: 82 mg/dL (ref 70–99)

## 2011-07-10 LAB — HEPARIN LEVEL (UNFRACTIONATED): Heparin Unfractionated: 0.61 IU/mL (ref 0.30–0.70)

## 2011-07-10 MED ORDER — PHENYLEPHRINE HCL 10 MG/ML IJ SOLN
30.0000 ug/min | INTRAVENOUS | Status: AC
  Administered 2011-07-11: 15 ug/min via INTRAVENOUS
  Filled 2011-07-10: qty 2

## 2011-07-10 MED ORDER — DEXTROSE 5 % IV SOLN
750.0000 mg | INTRAVENOUS | Status: DC
  Filled 2011-07-10: qty 750

## 2011-07-10 MED ORDER — POTASSIUM CHLORIDE 2 MEQ/ML IV SOLN
80.0000 meq | INTRAVENOUS | Status: DC
  Filled 2011-07-10: qty 40

## 2011-07-10 MED ORDER — SODIUM CHLORIDE 0.9 % IV SOLN
INTRAVENOUS | Status: AC
  Administered 2011-07-11: 70 mL/h via INTRAVENOUS
  Filled 2011-07-10: qty 40

## 2011-07-10 MED ORDER — TIOTROPIUM BROMIDE MONOHYDRATE 18 MCG IN CAPS
18.0000 ug | ORAL_CAPSULE | Freq: Every day | RESPIRATORY_TRACT | Status: DC
  Administered 2011-07-10: 18 ug via RESPIRATORY_TRACT
  Filled 2011-07-10: qty 5

## 2011-07-10 MED ORDER — VERAPAMIL HCL 2.5 MG/ML IV SOLN
INTRAVENOUS | Status: AC
  Administered 2011-07-11: 09:00:00
  Filled 2011-07-10 (×2): qty 2.5

## 2011-07-10 MED ORDER — MAGNESIUM SULFATE 50 % IJ SOLN
40.0000 meq | INTRAMUSCULAR | Status: DC
  Filled 2011-07-10: qty 10

## 2011-07-10 MED ORDER — DOPAMINE-DEXTROSE 3.2-5 MG/ML-% IV SOLN
2.0000 ug/kg/min | INTRAVENOUS | Status: AC
  Administered 2011-07-11: 3 ug/kg/min via INTRAVENOUS
  Filled 2011-07-10: qty 250

## 2011-07-10 MED ORDER — EPINEPHRINE HCL 1 MG/ML IJ SOLN
0.5000 ug/min | INTRAVENOUS | Status: DC
  Filled 2011-07-10: qty 4

## 2011-07-10 MED ORDER — FLUTICASONE-SALMETEROL 250-50 MCG/DOSE IN AEPB
1.0000 | INHALATION_SPRAY | Freq: Two times a day (BID) | RESPIRATORY_TRACT | Status: DC
  Administered 2011-07-10 (×2): 1 via RESPIRATORY_TRACT
  Filled 2011-07-10: qty 14

## 2011-07-10 MED ORDER — NITROGLYCERIN IN D5W 200-5 MCG/ML-% IV SOLN
2.0000 ug/min | INTRAVENOUS | Status: DC
  Filled 2011-07-10: qty 250

## 2011-07-10 MED ORDER — SODIUM CHLORIDE 0.9 % IV SOLN
0.1000 ug/kg/h | INTRAVENOUS | Status: AC
  Administered 2011-07-11: .2 ug/kg/h via INTRAVENOUS
  Filled 2011-07-10: qty 4

## 2011-07-10 MED ORDER — VANCOMYCIN HCL 1000 MG IV SOLR
1250.0000 mg | INTRAVENOUS | Status: AC
  Administered 2011-07-11: 1250 mg via INTRAVENOUS
  Filled 2011-07-10: qty 1250

## 2011-07-10 MED ORDER — DEXTROSE 5 % IV SOLN
1.5000 g | INTRAVENOUS | Status: AC
  Administered 2011-07-11: 1.5 g via INTRAVENOUS
  Administered 2011-07-11: .75 g via INTRAVENOUS
  Filled 2011-07-10: qty 1.5

## 2011-07-10 MED ORDER — SODIUM CHLORIDE 0.9 % IV SOLN
INTRAVENOUS | Status: AC
  Administered 2011-07-11: .9 [IU]/h via INTRAVENOUS
  Filled 2011-07-10: qty 1

## 2011-07-10 NOTE — Progress Notes (Signed)
Report from Night RN. Chart reviewed together. Handoff complete.  

## 2011-07-10 NOTE — Progress Notes (Signed)
Patient ID: [name], male   DOB: 07-04-1940, 71 y.o.   MRN: [id number]   Subjective:    [name] is a pleasant 71 year old gentleman with a history of hyperlipidemia, long smoking history for 60 years, COPD, stent to his left lower extremity for claudication followed by Dr. Evette Cristal, also with a vein ablation. Started feeling bad January 2013 with SOB, PND/Orhtopnea and lower extremity edema. Initially treated for pneumonia x 2 with no improvement. ? Mass noted in  LLL. (will need CT of chest at some point). He recently started on Lasix 40 mg daily and his dyspnea improved.    3/14 LHC Severe proximal and distal left main disease. Severe ostial LAD disease and proximal LCX disease. Severe mid RCA disease. LV gram showing global LV hypokinesis, severely depressed EF, estimated at 20 to 25%. Elevated wedge pressures of 22 (mean 19). Moderate ostial/proximal left subclavian disease, at least moderate mid left subclavian disease.  AO 138/67 LV 161/11 RA: 9/7 Mean 6 RV: 45/5, mean 7 PA: 47/24, mean 34 PCWP: 22, mean 19   Short of breath still but has improved with diuresis. Denies CP        Objective:    Vital Signs:   Temp:  [96.7 F (35.9 C)-98.1 F (36.7 C)] 97.4 F (36.3 C) (03/15 0747) Pulse Rate:  [73-88] 84  (03/15 0747) Resp:  [16-27] 16  (03/14 2004) BP: (105-157)/(63-87) 121/76 mmHg (03/15 0747) SpO2:  [93 %-98 %] 96 % (03/15 0747) Weight:  [71.668 kg (158 lb)] 71.668 kg (158 lb) (03/14 1019)    24-hour weight change: Weight change:   Intake/Output:   Intake/Output Summary (Last 24 hours) at 07/08/11 0808 Last data filed at 07/08/11 0600  Gross per 24 hour  Intake 658.99 ml  Output    801 ml  Net -142.01 ml     Physical Exam: General:  Chronically ill appearing.  HEENT: normal Neck: supple. JVP 7-8 cm. Carotids 2+ bilat; no bruits. No lymphadenopathy or thryomegaly appreciated. Cor: PMI nondisplaced. Regular rate & rhythm. No rubs, gallops or murmurs. Lungs:  Decreased breath sounds bilaterally Abdomen: soft, nontender, nondistended. No hepatosplenomegaly. No bruits or masses. Good bowel sounds. Extremities: no cyanosis, clubbing, rash, edema Neuro: alert & orientedx3, cranial nerves grossly intact. moves all 4 extremities w/o difficulty. Affect pleasant  Telemetry: Sinus Rhythm  Labs: Basic Metabolic Panel:  Lab 07/07/11 6433  NA 136  K 4.0  CL 101  CO2 25  GLUCOSE 83  BUN 25*  CREATININE 1.13  CALCIUM 8.9  MG --  PHOS --    Coagulation Studies:  Basename 07/07/11 1047  LABPROT 15.4*  INR 1.19    Other results: EKG: 3/7/  SR 83  Anterolateral T wave changes LAE     Medications:     Scheduled Medications:    . aspirin  324 mg Oral Pre-Cath  . aspirin  81 mg Oral Daily  . atorvastatin  20 mg Oral q1800  . captopril  6.25 mg Oral TID  . furosemide  40 mg Intravenous BID  . heparin      . levothyroxine  200 mcg Oral QAC breakfast  . levothyroxine  25 mcg Oral QODAY  . lidocaine      . metoprolol tartrate  12.5 mg Oral BID  . midazolam      . nitroGLYCERIN      . DISCONTD: levothyroxine  200 mcg Oral Daily  . DISCONTD: nicotine  14 mg Transdermal Daily    Infusions:    .  sodium chloride 1 mL/kg/hr (07/07/11 2000)  . heparin 850 Units/hr (07/08/11 0600)  . DISCONTD: sodium chloride       PRN Medications:  acetaminophen, albuterol, ipratropium, nitroGLYCERIN, ondansetron (ZOFRAN) IV, DISCONTD: sodium chloride, DISCONTD: nitroGLYCERIN   Assessment:  1. CAD ischemic cardiomyopathy 3 vessel disease  2. Acute systolic heart failure EF 20-25% 3. COPD 4. A/C respiratory failure 5. Hypothyroid   Plan/Discussion:    Patient continues to have dyspnea with exertion but has improved considerably with diuresis.  He does not appear significantly volume overloaded on exam today, which fits his right heart cath where RA pressure and PCWP were not significantly elevated.  Pulmonary hypertension was likely in  large part due to COPD.  He will be high risk for CABG due to significant COPD and systolic dysfunction.  Contine PO Lasix Lisinopril  Beta one selective bioprolol  CAD:  See note form Dr Tyrone Sage.  CABG Monday continue heparin and nitro  Length of Stay: 1  [name] 07/10/2011 8:25 AM

## 2011-07-10 NOTE — Progress Notes (Signed)
ANTICOAGULATION CONSULT NOTE - Follow Up Consult  Pharmacy Consult for heparin Indication: 3V CAD awaiting CABG  Labs:  Basename 07/10/11 0532 07/09/11 1520 07/09/11 0500 07/08/11 0918 07/07/11 1047  HGB 13.4 -- 13.3 -- --  HCT 41.6 -- 41.2 50.5 --  PLT 264 -- 257 298 --  APTT -- -- -- -- --  LABPROT -- -- -- -- 15.4*  INR -- -- -- -- 1.19  HEPARINUNFRC 0.61 0.65 0.30 -- --  CREATININE 1.54* -- 1.45* 1.23 --  CKTOTAL -- -- -- -- --  CKMB -- -- -- -- --  TROPONINI -- -- -- -- --    Assessment: 71yo male  on heparin while awaiting CABG 03/18. Heparin therapeutic @ 0.61. Hgb stable, plts ok.  No bleeding noted.    Goal of Therapy:  Heparin level 0.3-0.7 units/ml   Plan:  Continue heparin at current rate of 1400 units/hr.  F/u daily heparin levels, CBC.    Haynes Hoehn E PharmD BCPS 07/10/2011,8:27 AM

## 2011-07-10 NOTE — Consult Note (Signed)
Name: Senon Nixon MRN: [id number] DOB: [birth year]  LOS: 3  PULMONARY Progress  NOTE  History of Present Illness: This is a 71 y/o male with severe CAD who was admitted on 3/14 after a LHC showed severe CAD and CHF.  He is to go for a CABG next week and pulmonary medicine is consulted for perioperative pulmonary risk stratification.  He states that up until three months ago he always considered himself healthy.  However after Christmas he developed shortness of breath.  He was treated for pneumonia twice and given spiriva and albuterol but these measures did not help.  He saw Dr. Mariah Milling for further evaluation after he developed leg swelling and was found to have severe left main, severe LAD and prox circ and severe mid RCA disease with an LVEF at 20-25%.    Lines / Drains:   Cultures / Sepsis markers:   Antibiotics:   Tests / Events: 07/01/11 CT chest: marked centrilobular emphysema in the upper lobes, RLL patch of ground glass, loculated fluid in fissure on left, small left pleural fluid collection 3/15 PFT >FEV1 1.68 L (53%), Fev1/FVC 57   Subjective :  No resp complaints   Vital Signs:   Filed Vitals:   07/09/11 2113 07/09/11 2353 07/10/11 0256 07/10/11 0803  BP: 110/57 112/58 95/39   Pulse:  69 66   Temp:  97.7 F (36.5 C) 98.1 F (36.7 C) 97.6 F (36.4 C)  TempSrc:  Oral Oral Oral  Resp:      Height:      Weight:      SpO2:  93% 94%     Physical Examination: Gen: chronically ill appearing, no acute distress HEENT: NCAT, PERRL, EOMi, OP clear,  Neck: supple without masses PULM: Insp crackles and diminished sounds left base, otherwise clear to auscultation CV: RRR, no mgr, no JVD AB: BS+, soft, nontender, no hsm Ext: warm, no edema, clubbing is noted in hands, there is no cyanosis Derm: no rash or skin breakdown Neuro: A&Ox4, CN II-XII intact, strength 5/5 in all 4 extremities Psyche: Normal mood and affect  Labs and Imaging:   CBC    Component Value Date/Time     WBC 7.9 07/10/2011 0532   RBC 4.87 07/10/2011 0532   HGB 13.4 07/10/2011 0532   HCT 41.6 07/10/2011 0532   PLT 264 07/10/2011 0532   MCV 85.4 07/10/2011 0532   MCH 27.5 07/10/2011 0532   MCHC 32.2 07/10/2011 0532   RDW 15.6* 07/10/2011 0532   LYMPHSABS 1.4 06/29/2011 1121   MONOABS 0.7 06/29/2011 1121   EOSABS 0.1 06/29/2011 1121   BASOSABS 0.0 06/29/2011 1121    BMET    Component Value Date/Time   NA 137 07/10/2011 0532   K 3.9 07/10/2011 0532   CL 102 07/10/2011 0532   CO2 26 07/10/2011 0532   GLUCOSE 82 07/10/2011 0532   BUN 31* 07/10/2011 0532   CREATININE 1.54* 07/10/2011 0532   CREATININE 1.02 04/22/2011 1507   CALCIUM 9.2 07/10/2011 0532   GFRNONAA 44* 07/10/2011 0532   GFRAA 51* 07/10/2011 0532     Assessment and Plan:  This is a very pleasant male with severe CAD who needs a CABG and pulmonary medicine is consulted for evaluation of perioperative pulmonary risk stratification.  CAD (coronary artery disease), native coronary artery (07/07/2011)   Assessment: severe, with ischaemic cardiomyopathy; needs CABG   Plan:  -per cardiology and CV surgery  COPD/chronic bronchitis   Assessment: centrilobular upper lobe emphysema, appears that dyspnea  is mostly due to CHF, but suspect that emphysema is contributing;   PFT completed > with FEV1 53% , ratio 57 - gold stg 2 copd    Plan: add Spiriva and Advair - he had no benefit from these meds prior but I have asked him to stay on them peri-op. He will fu with dr Kendrick Fries to decide whether these meds need to be continued long term (doubt) Albuterol/atrovent nebs q 6 can be used immediately post op Agree with cardioselective bisoprolol, toerlating well - no bspasm   PARRETT,TAMMY NP - C 07/10/2011, 10:03 AM  Independently examined pt, evaluated data & formulated above care plan with NP PCCM to sign off, pl call as needed  Cyril Mourning MD. FCCP. [facility] Pager [phone] If no response call [phone]

## 2011-07-11 ENCOUNTER — Encounter (HOSPITAL_COMMUNITY): Payer: Self-pay | Admitting: Anesthesiology

## 2011-07-11 ENCOUNTER — Encounter (HOSPITAL_COMMUNITY)
Admission: RE | Disposition: A | Discharge status: Home / Self Care | Payer: Self-pay | Source: Ambulatory Visit | Attending: Cardiothoracic Surgery

## 2011-07-11 ENCOUNTER — Inpatient Hospital Stay (HOSPITAL_COMMUNITY): Payer: Medicare Other

## 2011-07-11 ENCOUNTER — Other Ambulatory Visit: Payer: Self-pay

## 2011-07-11 ENCOUNTER — Inpatient Hospital Stay (HOSPITAL_COMMUNITY): Payer: Medicare Other | Admitting: Anesthesiology

## 2011-07-11 DIAGNOSIS — I2510 Atherosclerotic heart disease of native coronary artery without angina pectoris: Secondary | ICD-10-CM

## 2011-07-11 HISTORY — PX: CORONARY ARTERY BYPASS GRAFT: SHX141

## 2011-07-11 LAB — COMPREHENSIVE METABOLIC PANEL
ALT: 12 U/L (ref 0–53)
AST: 15 U/L (ref 0–37)
Albumin: 3 g/dL — ABNORMAL LOW (ref 3.5–5.2)
Alkaline Phosphatase: 81 U/L (ref 39–117)
BUN: 34 mg/dL — ABNORMAL HIGH (ref 6–23)
CO2: 24 mEq/L (ref 19–32)
Calcium: 9 mg/dL (ref 8.4–10.5)
Chloride: 99 mEq/L (ref 96–112)
Creatinine, Ser: 1.88 mg/dL — ABNORMAL HIGH (ref 0.50–1.35)
GFR calc Af Amer: 40 mL/min — ABNORMAL LOW (ref >90)
GFR calc non Af Amer: 35 mL/min — ABNORMAL LOW (ref >90)
Glucose, Bld: 98 mg/dL (ref 70–99)
Potassium: 4.2 mEq/L (ref 3.5–5.1)
Sodium: 138 mEq/L (ref 135–145)
Total Bilirubin: 0.6 mg/dL (ref 0.3–1.2)
Total Protein: 6 g/dL (ref 6.0–8.3)

## 2011-07-11 LAB — URINALYSIS, ROUTINE W REFLEX MICROSCOPIC
Bilirubin Urine: NEGATIVE
Glucose, UA: NEGATIVE mg/dL
Hgb urine dipstick: NEGATIVE
Ketones, ur: NEGATIVE mg/dL
Leukocytes, UA: NEGATIVE
Nitrite: NEGATIVE
Protein, ur: NEGATIVE mg/dL
Specific Gravity, Urine: 1.01 (ref 1.005–1.030)
Urobilinogen, UA: 1 mg/dL (ref 0.0–1.0)
pH: 6 (ref 5.0–8.0)

## 2011-07-11 LAB — CBC
HCT: 38.4 % — ABNORMAL LOW (ref 39.0–52.0)
HCT: 37.6 % — ABNORMAL LOW (ref 39.0–52.0)
HCT: 39.6 % (ref 39.0–52.0)
Hemoglobin: 12.5 g/dL — ABNORMAL LOW (ref 13.0–17.0)
Hemoglobin: 12.2 g/dL — ABNORMAL LOW (ref 13.0–17.0)
Hemoglobin: 13 g/dL (ref 13.0–17.0)
MCH: 27.6 pg (ref 26.0–34.0)
MCH: 27.7 pg (ref 26.0–34.0)
MCH: 28.1 pg (ref 26.0–34.0)
MCHC: 32.6 g/dL (ref 30.0–36.0)
MCHC: 32.8 g/dL (ref 30.0–36.0)
MCV: 84.8 fL (ref 78.0–100.0)
MCV: 85.3 fL (ref 78.0–100.0)
MCV: 85.7 fL (ref 78.0–100.0)
Platelets: 233 10*3/uL (ref 150–400)
Platelets: 177 10*3/uL (ref 150–400)
RBC: 4.53 MIL/uL (ref 4.22–5.81)
RBC: 4.41 MIL/uL (ref 4.22–5.81)
RBC: 4.62 MIL/uL (ref 4.22–5.81)
RDW: 15.5 % (ref 11.5–15.5)
RDW: 15.7 % — ABNORMAL HIGH (ref 11.5–15.5)
WBC: 8.7 10*3/uL (ref 4.0–10.5)
WBC: 13 10*3/uL — ABNORMAL HIGH (ref 4.0–10.5)
WBC: 15.6 10*3/uL — ABNORMAL HIGH (ref 4.0–10.5)

## 2011-07-11 LAB — POCT I-STAT 4, (NA,K, GLUC, HGB,HCT)
Glucose, Bld: 90 mg/dL (ref 70–99)
Glucose, Bld: 89 mg/dL (ref 70–99)
Glucose, Bld: 113 mg/dL — ABNORMAL HIGH (ref 70–99)
HCT: 34 % — ABNORMAL LOW (ref 39.0–52.0)
HCT: 28 % — ABNORMAL LOW (ref 39.0–52.0)
HCT: 30 % — ABNORMAL LOW (ref 39.0–52.0)
Hemoglobin: 9.5 g/dL — ABNORMAL LOW (ref 13.0–17.0)
Hemoglobin: 9.5 g/dL — ABNORMAL LOW (ref 13.0–17.0)
Hemoglobin: 10.2 g/dL — ABNORMAL LOW (ref 13.0–17.0)
Hemoglobin: 12.2 g/dL — ABNORMAL LOW (ref 13.0–17.0)
Potassium: 3.8 mEq/L (ref 3.5–5.1)
Potassium: 5.1 mEq/L (ref 3.5–5.1)
Potassium: 4 mEq/L (ref 3.5–5.1)
Sodium: 137 mEq/L (ref 135–145)
Sodium: 134 mEq/L — ABNORMAL LOW (ref 135–145)
Sodium: 135 mEq/L (ref 135–145)
Sodium: 138 mEq/L (ref 135–145)

## 2011-07-11 LAB — POCT I-STAT 3, ART BLOOD GAS (G3+)
Acid-base deficit: 2 mmol/L (ref 0.0–2.0)
Bicarbonate: 22.6 mEq/L (ref 20.0–24.0)
O2 Saturation: 100 %
Patient temperature: 36.2
Patient temperature: 36.6
Patient temperature: 36.8
TCO2: 26 mmol/L (ref 0–100)
TCO2: 24 mmol/L (ref 0–100)
pCO2 arterial: 45.9 mmHg — ABNORMAL HIGH (ref 35.0–45.0)
pCO2 arterial: 41.7 mmHg (ref 35.0–45.0)
pCO2 arterial: 45.7 mmHg — ABNORMAL HIGH (ref 35.0–45.0)
pCO2 arterial: 42.1 mmHg (ref 35.0–45.0)
pCO2 arterial: 48.5 mmHg — ABNORMAL HIGH (ref 35.0–45.0)
pH, Arterial: 7.365 (ref 7.350–7.450)
pH, Arterial: 7.299 — ABNORMAL LOW (ref 7.350–7.450)
pH, Arterial: 7.313 — ABNORMAL LOW (ref 7.350–7.450)
pH, Arterial: 7.256 — ABNORMAL LOW (ref 7.350–7.450)
pO2, Arterial: 355 mmHg — ABNORMAL HIGH (ref 80.0–100.0)
pO2, Arterial: 372 mmHg — ABNORMAL HIGH (ref 80.0–100.0)
pO2, Arterial: 68 mmHg — ABNORMAL LOW (ref 80.0–100.0)

## 2011-07-11 LAB — HEMOGLOBIN A1C
Hgb A1c MFr Bld: 5.4 % (ref <5.7)
Mean Plasma Glucose: 108 mg/dL (ref <117)

## 2011-07-11 LAB — HEPARIN LEVEL (UNFRACTIONATED): Heparin Unfractionated: 0.51 IU/mL (ref 0.30–0.70)

## 2011-07-11 LAB — PLATELET COUNT: Platelets: 171 10*3/uL (ref 150–400)

## 2011-07-11 LAB — HEMOGLOBIN AND HEMATOCRIT, BLOOD
HCT: 29.3 % — ABNORMAL LOW (ref 39.0–52.0)
Hemoglobin: 9.6 g/dL — ABNORMAL LOW (ref 13.0–17.0)

## 2011-07-11 LAB — GLUCOSE, CAPILLARY
Glucose-Capillary: 110 mg/dL — ABNORMAL HIGH (ref 70–99)
Glucose-Capillary: 115 mg/dL — ABNORMAL HIGH (ref 70–99)

## 2011-07-11 LAB — POCT I-STAT, CHEM 8
HCT: 41 % (ref 39.0–52.0)
Hemoglobin: 13.9 g/dL (ref 13.0–17.0)
Sodium: 139 mEq/L (ref 135–145)
TCO2: 21 mmol/L (ref 0–100)

## 2011-07-11 LAB — MAGNESIUM: Magnesium: 3 mg/dL — ABNORMAL HIGH (ref 1.5–2.5)

## 2011-07-11 LAB — APTT: aPTT: 35 seconds (ref 24–37)

## 2011-07-11 LAB — CREATININE, SERUM
Creatinine, Ser: 1.48 mg/dL — ABNORMAL HIGH (ref 0.50–1.35)
GFR calc Af Amer: 54 mL/min — ABNORMAL LOW (ref >90)
GFR calc non Af Amer: 46 mL/min — ABNORMAL LOW (ref >90)

## 2011-07-11 SURGERY — CORONARY ARTERY BYPASS GRAFTING (CABG)
Anesthesia: General | Site: Chest | Wound class: Clean

## 2011-07-11 MED ORDER — HEMOSTATIC AGENTS (NO CHARGE) OPTIME
TOPICAL | Status: DC | PRN
  Administered 2011-07-11: 2 via TOPICAL

## 2011-07-11 MED ORDER — OXYCODONE HCL 5 MG PO TABS
5.0000 mg | ORAL_TABLET | ORAL | Status: DC | PRN
  Administered 2011-07-13: 5 mg via ORAL
  Administered 2011-07-14 (×2): 10 mg via ORAL
  Administered 2011-07-14: 5 mg via ORAL
  Administered 2011-07-14: 10 mg via ORAL
  Administered 2011-07-14: 5 mg via ORAL
  Administered 2011-07-15 – 2011-07-16 (×4): 10 mg via ORAL
  Administered 2011-07-17: 5 mg via ORAL
  Administered 2011-07-18 – 2011-07-19 (×5): 10 mg via ORAL
  Administered 2011-07-20: 5 mg via ORAL
  Administered 2011-07-21: 10 mg via ORAL
  Filled 2011-07-11 (×2): qty 1
  Filled 2011-07-11 (×2): qty 2
  Filled 2011-07-11: qty 1
  Filled 2011-07-11 (×3): qty 2
  Filled 2011-07-11 (×2): qty 1
  Filled 2011-07-11 (×3): qty 2
  Filled 2011-07-11: qty 1
  Filled 2011-07-11 (×5): qty 2

## 2011-07-11 MED ORDER — ACETAMINOPHEN 160 MG/5ML PO SOLN: 650.0000 mg | ORAL | Status: AC

## 2011-07-11 MED ORDER — LACTATED RINGERS IV SOLN: 500.0000 mL | Freq: Once | INTRAVENOUS | Status: AC | PRN

## 2011-07-11 MED ORDER — ASPIRIN EC 325 MG PO TBEC
325.0000 mg | DELAYED_RELEASE_TABLET | Freq: Every day | ORAL | Status: DC
  Administered 2011-07-12 – 2011-07-22 (×11): 325 mg via ORAL
  Filled 2011-07-11 (×11): qty 1

## 2011-07-11 MED ORDER — ALBUMIN HUMAN 5 % IV SOLN
INTRAVENOUS | Status: DC | PRN
  Administered 2011-07-11: 13:00:00 via INTRAVENOUS

## 2011-07-11 MED ORDER — LACTATED RINGERS IV SOLN
INTRAVENOUS | Status: DC | PRN
  Administered 2011-07-11 (×4): via INTRAVENOUS

## 2011-07-11 MED ORDER — DOPAMINE-DEXTROSE 3.2-5 MG/ML-% IV SOLN: 2.0000 ug/kg/min | INTRAVENOUS | Status: DC

## 2011-07-11 MED ORDER — METOPROLOL TARTRATE 1 MG/ML IV SOLN
2.5000 mg | INTRAVENOUS | Status: DC | PRN
  Administered 2011-07-13: 5 mg via INTRAVENOUS
  Filled 2011-07-11: qty 5

## 2011-07-11 MED ORDER — DEXTROSE 5 % IV SOLN
1.5000 g | Freq: Two times a day (BID) | INTRAVENOUS | Status: AC
  Administered 2011-07-12 – 2011-07-13 (×4): 1.5 g via INTRAVENOUS
  Filled 2011-07-11 (×4): qty 1.5

## 2011-07-11 MED ORDER — ALBUMIN HUMAN 5 % IV SOLN
250.0000 mL | INTRAVENOUS | Status: AC | PRN
  Administered 2011-07-11 – 2011-07-12 (×2): 250 mL via INTRAVENOUS

## 2011-07-11 MED ORDER — ROCURONIUM BROMIDE 100 MG/10ML IV SOLN
INTRAVENOUS | Status: DC | PRN
  Administered 2011-07-11: 20 mg via INTRAVENOUS
  Administered 2011-07-11: 100 mg via INTRAVENOUS
  Administered 2011-07-11: 20 mg via INTRAVENOUS
  Administered 2011-07-11: 10 mg via INTRAVENOUS

## 2011-07-11 MED ORDER — HEPARIN SODIUM (PORCINE) 1000 UNIT/ML IJ SOLN
INTRAMUSCULAR | Status: DC | PRN
  Administered 2011-07-11: 33000 [IU] via INTRAVENOUS

## 2011-07-11 MED ORDER — MORPHINE SULFATE 4 MG/ML IJ SOLN
2.0000 mg | INTRAMUSCULAR | Status: DC | PRN
  Administered 2011-07-11 – 2011-07-12 (×3): 2 mg via INTRAVENOUS
  Administered 2011-07-13 (×2): 4 mg via INTRAVENOUS
  Filled 2011-07-11 (×4): qty 1

## 2011-07-11 MED ORDER — ACETAMINOPHEN 160 MG/5ML PO SOLN
975.0000 mg | Freq: Four times a day (QID) | ORAL | Status: DC
  Filled 2011-07-11: qty 40.6

## 2011-07-11 MED ORDER — POTASSIUM CHLORIDE 10 MEQ/50ML IV SOLN: 10.0000 meq | INTRAVENOUS | Status: DC

## 2011-07-11 MED ORDER — METOPROLOL TARTRATE 25 MG/10 ML ORAL SUSPENSION
12.5000 mg | Freq: Two times a day (BID) | ORAL | Status: DC
  Filled 2011-07-11 (×5): qty 5

## 2011-07-11 MED ORDER — NITROGLYCERIN IN D5W 200-5 MCG/ML-% IV SOLN: 0.0000 ug/min | INTRAVENOUS | Status: DC

## 2011-07-11 MED ORDER — VANCOMYCIN HCL 1000 MG IV SOLR
1000.0000 mg | Freq: Once | INTRAVENOUS | Status: AC
  Administered 2011-07-11: 1000 mg via INTRAVENOUS
  Filled 2011-07-11: qty 1000

## 2011-07-11 MED ORDER — BISACODYL 5 MG PO TBEC
10.0000 mg | DELAYED_RELEASE_TABLET | Freq: Every day | ORAL | Status: DC
  Administered 2011-07-12 – 2011-07-14 (×3): 10 mg via ORAL
  Filled 2011-07-11 (×3): qty 2

## 2011-07-11 MED ORDER — PROTAMINE SULFATE 10 MG/ML IV SOLN
INTRAVENOUS | Status: DC | PRN
  Administered 2011-07-11: 20 mg via INTRAVENOUS
  Administered 2011-07-11: 330 mg via INTRAVENOUS

## 2011-07-11 MED ORDER — PANTOPRAZOLE SODIUM 40 MG PO TBEC
40.0000 mg | DELAYED_RELEASE_TABLET | Freq: Every day | ORAL | Status: DC
  Administered 2011-07-13 – 2011-07-22 (×9): 40 mg via ORAL
  Filled 2011-07-11 (×8): qty 1

## 2011-07-11 MED ORDER — MORPHINE SULFATE 2 MG/ML IJ SOLN
1.0000 mg | INTRAMUSCULAR | Status: AC | PRN
  Filled 2011-07-11: qty 1

## 2011-07-11 MED ORDER — INSULIN REGULAR BOLUS VIA INFUSION
0.0000 [IU] | Freq: Three times a day (TID) | INTRAVENOUS | Status: DC
  Filled 2011-07-11: qty 10

## 2011-07-11 MED ORDER — SODIUM CHLORIDE 0.45 % IV SOLN
INTRAVENOUS | Status: DC
  Administered 2011-07-11: 20 mL via INTRAVENOUS

## 2011-07-11 MED ORDER — SODIUM CHLORIDE 0.9 % IV SOLN
INTRAVENOUS | Status: DC
  Filled 2011-07-11: qty 1

## 2011-07-11 MED ORDER — MIDAZOLAM HCL 5 MG/5ML IJ SOLN
INTRAMUSCULAR | Status: DC | PRN
  Administered 2011-07-11 (×2): 2 mg via INTRAVENOUS
  Administered 2011-07-11: 4 mg via INTRAVENOUS

## 2011-07-11 MED ORDER — LEVOTHYROXINE SODIUM 200 MCG PO TABS
200.0000 ug | ORAL_TABLET | Freq: Every day | ORAL | Status: DC
  Administered 2011-07-12 – 2011-07-22 (×11): 200 ug via ORAL
  Filled 2011-07-11 (×11): qty 1

## 2011-07-11 MED ORDER — MAGNESIUM SULFATE 40 MG/ML IJ SOLN
4.0000 g | Freq: Once | INTRAMUSCULAR | Status: AC
  Administered 2011-07-11: 4 g via INTRAVENOUS
  Filled 2011-07-11: qty 100

## 2011-07-11 MED ORDER — FENTANYL CITRATE 0.05 MG/ML IJ SOLN
INTRAMUSCULAR | Status: DC | PRN
  Administered 2011-07-11 (×6): 250 ug via INTRAVENOUS

## 2011-07-11 MED ORDER — SODIUM CHLORIDE 0.9 % IV SOLN: 250.0000 mL | INTRAVENOUS | Status: DC

## 2011-07-11 MED ORDER — MILRINONE IN DEXTROSE 200-5 MCG/ML-% IV SOLN
0.1500 ug/kg/min | INTRAVENOUS | Status: DC
Start: 2011-07-11 — End: 2011-07-12
  Filled 2011-07-11 (×3): qty 100

## 2011-07-11 MED ORDER — MILRINONE IN DEXTROSE 200-5 MCG/ML-% IV SOLN
INTRAVENOUS | Status: DC | PRN
  Administered 2011-07-11: 0.25 ug/kg/min via INTRAVENOUS

## 2011-07-11 MED ORDER — METOPROLOL TARTRATE 12.5 MG HALF TABLET
12.5000 mg | ORAL_TABLET | Freq: Two times a day (BID) | ORAL | Status: DC
  Administered 2011-07-13 – 2011-07-18 (×10): 12.5 mg via ORAL
  Filled 2011-07-11 (×17): qty 1

## 2011-07-11 MED ORDER — BISACODYL 10 MG RE SUPP: 10.0000 mg | Freq: Every day | RECTAL | Status: DC

## 2011-07-11 MED ORDER — INSULIN ASPART 100 UNIT/ML ~~LOC~~ SOLN
0.0000 [IU] | SUBCUTANEOUS | Status: AC
  Administered 2011-07-11: 2 [IU] via SUBCUTANEOUS

## 2011-07-11 MED ORDER — SODIUM CHLORIDE 0.9 % IV SOLN
INTRAVENOUS | Status: DC
  Administered 2011-07-11: 20 mL via INTRAVENOUS

## 2011-07-11 MED ORDER — DOCUSATE SODIUM 100 MG PO CAPS
200.0000 mg | ORAL_CAPSULE | Freq: Every day | ORAL | Status: DC
  Administered 2011-07-12 – 2011-07-14 (×3): 200 mg via ORAL
  Filled 2011-07-11 (×4): qty 2

## 2011-07-11 MED ORDER — ACETAMINOPHEN 650 MG RE SUPP
650.0000 mg | RECTAL | Status: AC
  Administered 2011-07-11: 650 mg via RECTAL

## 2011-07-11 MED ORDER — LACTATED RINGERS IV SOLN
INTRAVENOUS | Status: DC
  Administered 2011-07-11: 60 mL via INTRAVENOUS
  Administered 2011-07-13: 01:00:00 via INTRAVENOUS

## 2011-07-11 MED ORDER — ONDANSETRON HCL 4 MG/2ML IJ SOLN
4.0000 mg | Freq: Four times a day (QID) | INTRAMUSCULAR | Status: DC | PRN
  Administered 2011-07-12 (×3): 4 mg via INTRAVENOUS
  Filled 2011-07-11 (×3): qty 2

## 2011-07-11 MED ORDER — PHENYLEPHRINE HCL 10 MG/ML IJ SOLN
0.0000 ug/min | INTRAVENOUS | Status: DC
  Administered 2011-07-11 – 2011-07-13 (×3): 30 ug/min via INTRAVENOUS
  Filled 2011-07-11 (×8): qty 2

## 2011-07-11 MED ORDER — PROPOFOL 10 MG/ML IV BOLUS
INTRAVENOUS | Status: DC | PRN
  Administered 2011-07-11: 50 mg via INTRAVENOUS

## 2011-07-11 MED ORDER — ASPIRIN 81 MG PO CHEW: 324.0000 mg | CHEWABLE_TABLET | Freq: Every day | ORAL | Status: DC

## 2011-07-11 MED ORDER — FAMOTIDINE IN NACL 20-0.9 MG/50ML-% IV SOLN
20.0000 mg | Freq: Two times a day (BID) | INTRAVENOUS | Status: AC
  Administered 2011-07-11: 20 mg via INTRAVENOUS

## 2011-07-11 MED ORDER — INSULIN ASPART 100 UNIT/ML ~~LOC~~ SOLN: 0.0000 [IU] | SUBCUTANEOUS | Status: DC

## 2011-07-11 MED ORDER — SODIUM CHLORIDE 0.9 % IJ SOLN: 3.0000 mL | INTRAMUSCULAR | Status: DC | PRN

## 2011-07-11 MED ORDER — MIDAZOLAM HCL 2 MG/2ML IJ SOLN: 2.0000 mg | INTRAMUSCULAR | Status: DC | PRN

## 2011-07-11 MED ORDER — HEMOSTATIC AGENTS (NO CHARGE) OPTIME
TOPICAL | Status: DC | PRN
  Administered 2011-07-11: 1 via TOPICAL

## 2011-07-11 MED ORDER — SODIUM CHLORIDE 0.9 % IV SOLN
0.1000 ug/kg/h | INTRAVENOUS | Status: DC
  Filled 2011-07-11 (×2): qty 2

## 2011-07-11 MED ORDER — SODIUM CHLORIDE 0.9 % IJ SOLN
3.0000 mL | Freq: Two times a day (BID) | INTRAMUSCULAR | Status: DC
  Administered 2011-07-12 – 2011-07-18 (×13): 3 mL via INTRAVENOUS

## 2011-07-11 MED ORDER — ACETAMINOPHEN 500 MG PO TABS
1000.0000 mg | ORAL_TABLET | Freq: Four times a day (QID) | ORAL | Status: DC
  Administered 2011-07-12 (×3): 1000 mg via ORAL
  Filled 2011-07-11 (×6): qty 2

## 2011-07-11 SURGICAL SUPPLY — 120 items
ADAPTER CARDIO PERF ANTE/RETRO (ADAPTER) ×2 IMPLANT
ATTRACTOMAT 16X20 MAGNETIC DRP (DRAPES) ×2 IMPLANT
BAG DECANTER FOR FLEXI CONT (MISCELLANEOUS) ×2 IMPLANT
BANDAGE ELASTIC 4 VELCRO ST LF (GAUZE/BANDAGES/DRESSINGS) ×2 IMPLANT
BANDAGE ELASTIC 6 VELCRO ST LF (GAUZE/BANDAGES/DRESSINGS) ×2 IMPLANT
BANDAGE GAUZE ELAST BULKY 4 IN (GAUZE/BANDAGES/DRESSINGS) ×2 IMPLANT
BLADE SAW STERNAL (BLADE) ×2 IMPLANT
BLADE SURG ROTATE 9660 (MISCELLANEOUS) IMPLANT
CANISTER SUCTION 2500CC (MISCELLANEOUS) ×2 IMPLANT
CANN PRFSN .5XCNCT 15X34-48 (MISCELLANEOUS) ×1
CANNULA AORTIC HI-FLOW 6.5M20F (CANNULA) ×2 IMPLANT
CANNULA GUNDRY RCSP 15FR (MISCELLANEOUS) ×2 IMPLANT
CANNULA PRFSN .5XCNCT 15X34-48 (MISCELLANEOUS) ×1 IMPLANT
CANNULA VEN 2 STAGE (MISCELLANEOUS) ×1
CATH CPB KIT GERHARDT (MISCELLANEOUS) ×2 IMPLANT
CATH THORACIC 28FR (CATHETERS) ×2 IMPLANT
CATH THORACIC 28FR RT ANG (CATHETERS) IMPLANT
CATH THORACIC 36FR (CATHETERS) IMPLANT
CATH THORACIC 36FR RT ANG (CATHETERS) IMPLANT
CLIP TI MEDIUM 24 (CLIP) IMPLANT
CLIP TI WIDE RED SMALL 24 (CLIP) IMPLANT
CLOTH BEACON ORANGE TIMEOUT ST (SAFETY) ×2 IMPLANT
CONN ST 1/4X3/8  BEN (MISCELLANEOUS) ×1
CONN ST 1/4X3/8 BEN (MISCELLANEOUS) ×1 IMPLANT
CONT SPEC 4OZ CLIKSEAL STRL BL (MISCELLANEOUS) ×2 IMPLANT
COVER SURGICAL LIGHT HANDLE (MISCELLANEOUS) ×4 IMPLANT
CRADLE DONUT ADULT HEAD (MISCELLANEOUS) ×2 IMPLANT
DRAIN CHANNEL 28F RND 3/8 FF (WOUND CARE) ×4 IMPLANT
DRAIN CHANNEL 32F RND 10.7 FF (WOUND CARE) IMPLANT
DRAPE CARDIOVASCULAR INCISE (DRAPES) ×1
DRAPE SLUSH MACHINE 52X66 (DRAPES) IMPLANT
DRAPE SLUSH/WARMER DISC (DRAPES) IMPLANT
DRAPE SRG 135X102X78XABS (DRAPES) ×1 IMPLANT
DRSG COVADERM 4X14 (GAUZE/BANDAGES/DRESSINGS) ×2 IMPLANT
ELECT BLADE 4.0 EZ CLEAN MEGAD (MISCELLANEOUS) ×2
ELECT CAUTERY BLADE 6.4 (BLADE) ×2 IMPLANT
ELECT REM PT RETURN 9FT ADLT (ELECTROSURGICAL) ×4
ELECTRODE BLDE 4.0 EZ CLN MEGD (MISCELLANEOUS) ×1 IMPLANT
ELECTRODE REM PT RTRN 9FT ADLT (ELECTROSURGICAL) ×2 IMPLANT
GLOVE BIO SURGEON STRL SZ 6 (GLOVE) IMPLANT
GLOVE BIO SURGEON STRL SZ 6.5 (GLOVE) ×12 IMPLANT
GLOVE BIO SURGEON STRL SZ7 (GLOVE) ×10 IMPLANT
GLOVE BIO SURGEON STRL SZ7.5 (GLOVE) ×6 IMPLANT
GLOVE BIOGEL PI IND STRL 6 (GLOVE) IMPLANT
GLOVE BIOGEL PI IND STRL 6.5 (GLOVE) ×6 IMPLANT
GLOVE BIOGEL PI IND STRL 7.0 (GLOVE) IMPLANT
GLOVE BIOGEL PI INDICATOR 6 (GLOVE)
GLOVE BIOGEL PI INDICATOR 6.5 (GLOVE) ×6
GLOVE BIOGEL PI INDICATOR 7.0 (GLOVE)
GLOVE EUDERMIC 7 POWDERFREE (GLOVE) IMPLANT
GLOVE ORTHO TXT STRL SZ7.5 (GLOVE) IMPLANT
GOWN STRL NON-REIN LRG LVL3 (GOWN DISPOSABLE) ×22 IMPLANT
HEMOSTAT POWDER SURGIFOAM 1G (HEMOSTASIS) ×6 IMPLANT
HEMOSTAT SURGICEL 2X14 (HEMOSTASIS) ×2 IMPLANT
INSERT FOGARTY 61MM (MISCELLANEOUS) IMPLANT
INSERT FOGARTY XLG (MISCELLANEOUS) IMPLANT
KIT BASIN OR (CUSTOM PROCEDURE TRAY) ×2 IMPLANT
KIT ROOM TURNOVER OR (KITS) ×2 IMPLANT
KIT SUCTION CATH 14FR (SUCTIONS) ×4 IMPLANT
KIT VASOVIEW W/TROCAR VH 2000 (KITS) ×2 IMPLANT
LEAD PACING MYOCARDI (MISCELLANEOUS) ×2 IMPLANT
MARKER GRAFT CORONARY BYPASS (MISCELLANEOUS) ×6 IMPLANT
NS IRRIG 1000ML POUR BTL (IV SOLUTION) ×10 IMPLANT
PACK OPEN HEART (CUSTOM PROCEDURE TRAY) ×2 IMPLANT
PAD ARMBOARD 7.5X6 YLW CONV (MISCELLANEOUS) ×4 IMPLANT
PENCIL BUTTON HOLSTER BLD 10FT (ELECTRODE) ×2 IMPLANT
PUNCH AORTIC ROTATE 4.0MM (MISCELLANEOUS) IMPLANT
PUNCH AORTIC ROTATE 4.5MM 8IN (MISCELLANEOUS) ×2 IMPLANT
PUNCH AORTIC ROTATE 5MM 8IN (MISCELLANEOUS) IMPLANT
SET CARDIOPLEGIA MPS 5001102 (MISCELLANEOUS) ×2 IMPLANT
SOLUTION ANTI FOG 6CC (MISCELLANEOUS) IMPLANT
SPONGE GAUZE 4X4 12PLY (GAUZE/BANDAGES/DRESSINGS) ×2 IMPLANT
SPONGE LAP 18X18 X RAY DECT (DISPOSABLE) ×4 IMPLANT
SPONGE LAP 4X18 X RAY DECT (DISPOSABLE) IMPLANT
SUT BONE WAX W31G (SUTURE) IMPLANT
SUT MNCRL AB 4-0 PS2 18 (SUTURE) IMPLANT
SUT PROLENE 3 0 SH 1 (SUTURE) ×2 IMPLANT
SUT PROLENE 3 0 SH DA (SUTURE) IMPLANT
SUT PROLENE 3 0 SH1 36 (SUTURE) ×4 IMPLANT
SUT PROLENE 4 0 RB 1 (SUTURE) ×1
SUT PROLENE 4 0 SH DA (SUTURE) IMPLANT
SUT PROLENE 4 0 TF (SUTURE) ×4 IMPLANT
SUT PROLENE 4-0 RB1 .5 CRCL 36 (SUTURE) ×1 IMPLANT
SUT PROLENE 5 0 C 1 36 (SUTURE) IMPLANT
SUT PROLENE 6 0 C 1 30 (SUTURE) ×2 IMPLANT
SUT PROLENE 6 0 CC (SUTURE) ×12 IMPLANT
SUT PROLENE 7 0 BV 1 (SUTURE) IMPLANT
SUT PROLENE 7 0 BV1 MDA (SUTURE) ×2 IMPLANT
SUT PROLENE 7.0 RB 3 (SUTURE) ×4 IMPLANT
SUT PROLENE 8 0 BV175 6 (SUTURE) ×10 IMPLANT
SUT SILK  1 MH (SUTURE)
SUT SILK 1 MH (SUTURE) IMPLANT
SUT SILK 2 0 SH CR/8 (SUTURE) IMPLANT
SUT SILK 2 0 TIES 10X30 (SUTURE) ×2 IMPLANT
SUT SILK 3 0 SH CR/8 (SUTURE) IMPLANT
SUT SILK 4 0 TIE 10X30 (SUTURE) ×4 IMPLANT
SUT STEEL 6MS V (SUTURE) ×2 IMPLANT
SUT STEEL STERNAL CCS#1 18IN (SUTURE) IMPLANT
SUT STEEL SZ 6 DBL 3X14 BALL (SUTURE) ×2 IMPLANT
SUT VIC AB 1 CTX 18 (SUTURE) ×4 IMPLANT
SUT VIC AB 1 CTX 36 (SUTURE)
SUT VIC AB 1 CTX36XBRD ANBCTR (SUTURE) IMPLANT
SUT VIC AB 2-0 CT1 27 (SUTURE) ×1
SUT VIC AB 2-0 CT1 TAPERPNT 27 (SUTURE) ×1 IMPLANT
SUT VIC AB 2-0 CTX 27 (SUTURE) IMPLANT
SUT VIC AB 3-0 SH 27 (SUTURE)
SUT VIC AB 3-0 SH 27X BRD (SUTURE) IMPLANT
SUT VIC AB 3-0 X1 27 (SUTURE) ×2 IMPLANT
SUT VICRYL 4-0 PS2 18IN ABS (SUTURE) IMPLANT
SUTURE E-PAK OPEN HEART (SUTURE) ×2 IMPLANT
SYSTEM SAHARA CHEST DRAIN ATS (WOUND CARE) ×2 IMPLANT
TAPE CLOTH SURG 4X10 WHT LF (GAUZE/BANDAGES/DRESSINGS) ×2 IMPLANT
TOWEL OR 17X24 6PK STRL BLUE (TOWEL DISPOSABLE) ×4 IMPLANT
TOWEL OR 17X26 10 PK STRL BLUE (TOWEL DISPOSABLE) ×4 IMPLANT
TRAY FOLEY IC TEMP SENS 14FR (CATHETERS) ×2 IMPLANT
TUBE FEEDING 8FR 16IN STR KANG (MISCELLANEOUS) ×4 IMPLANT
TUBE SUCT INTRACARD DLP 20F (MISCELLANEOUS) ×2 IMPLANT
TUBING INSUFFLATION 10FT LAP (TUBING) ×2 IMPLANT
UNDERPAD 30X30 INCONTINENT (UNDERPADS AND DIAPERS) ×4 IMPLANT
WATER STERILE IRR 1000ML POUR (IV SOLUTION) ×4 IMPLANT

## 2011-07-11 NOTE — Brief Op Note (Addendum)
                   [address]            [name] [address]          [phone]    07/07/2011 - 07/11/2011  11:27 AM  PATIENT:  [name]  71 y.o. male  PRE-OPERATIVE DIAGNOSIS:  Coronary Artery Disease, with left subclavian stenosis and EF 20%, history of saphaneous vein obliteration  POST-OPERATIVE DIAGNOSIS:  Coronary Artery Disease, same  PROCEDURE:  Procedure(s): CORONARY ARTERY BYPASS GRAFTING (CABG)x3(FREE LIMA-LAD; SVG-OM1; SVG-PD) OPEN VEIN HARVEST RIGHT lower LEG  SURGEON:  Surgeon(s): [name], MD  PHYSICIAN ASSISTANT: WAYNE GOLD PA-C  ANESTHESIA:   general  PATIENT CONDITION:  ICU - intubated and hemodynamically stable.  PRE-OPERATIVE WEIGHT: 69kg  COMPLICATIONS: NO KNOWN   Lines, SG, Foley, two MT's

## 2011-07-11 NOTE — Anesthesia Preprocedure Evaluation (Addendum)
Anesthesia Evaluation  Patient identified by MRN, date of birth, ID band Patient awake    Reviewed: Allergy & Precautions, H&P , NPO status   Airway Mallampati: II      Dental  (+) Edentulous Upper, Partial Lower, Teeth Intact and Poor Dentition   Pulmonary shortness of breath, COPDCurrent Smoker,    Pulmonary exam normal       Cardiovascular hypertension, Pt. on medications + CAD     Neuro/Psych    GI/Hepatic   Endo/Other  Hypothyroidism   Renal/GU      Musculoskeletal   Abdominal   Peds  Hematology   Anesthesia Other Findings   Reproductive/Obstetrics                          Anesthesia Physical Anesthesia Plan  ASA: IV  Anesthesia Plan: General   Post-op Pain Management:    Induction: Intravenous  Airway Management Planned: Oral ETT  Additional Equipment: PA Cath, CVP, Ultrasound Guidance Line Placement and Arterial line  Intra-op Plan:   Post-operative Plan: Post-operative intubation/ventilation  Informed Consent: I have reviewed the patients History and Physical, chart, labs and discussed the procedure including the risks, benefits and alternatives for the proposed anesthesia with the patient or authorized representative who has indicated his/her understanding and acceptance.   Dental advisory given  Plan Discussed with: CRNA, Anesthesiologist and Surgeon  Anesthesia Plan Comments:        Anesthesia Quick Evaluation

## 2011-07-11 NOTE — Progress Notes (Signed)
Patient extubated at 1715 per SICU wean. Tolerated wean weel, NIF -30, VC 1.1 L. Placed on 4 LNC. Patient able to vocalize and clear secretions. RT will continue to monitor.

## 2011-07-11 NOTE — Progress Notes (Signed)
  Echocardiogram Echocardiogram Transesophageal has been performed.  Dorena Cookey 07/11/2011, 8:44 AM

## 2011-07-11 NOTE — Anesthesia Postprocedure Evaluation (Signed)
  Anesthesia Post-op Note  Patient: [name]  Procedure(s) Performed: Procedure(s) (LRB): CORONARY ARTERY BYPASS GRAFTING (CABG) (N/A)   Patient Location: SICU  Anesthesia Type: General  Level of Consciousness: sedated and unresponsive  Airway and Oxygen Therapy: Patient remains intubated per anesthesia plan and Patient placed on Ventilator (see vital sign flow sheet for setting)  Post-op Pain: none  Post-op Assessment: Post-op Vital signs reviewed, Patient's Cardiovascular Status Stable, Respiratory Function Stable, Patent Airway and No signs of Nausea or vomiting  Post-op Vital Signs: Reviewed and stable  Complications: No apparent anesthesia complications

## 2011-07-11 NOTE — OR Nursing (Signed)
Both chest and leg procedure start at 08:33.

## 2011-07-11 NOTE — Progress Notes (Signed)
[facility] BRIEF SICU PROGRESS NOTE  Day of Surgery  S/P Procedure(s) (LRB): CORONARY ARTERY BYPASS GRAFTING (CABG) (N/A)   Extubated uneventfully AAI paced with stable hemodynamics Chest tube output low UOP excellent Labs okay  Plan: Continue routine early postop  [name] H 07/11/2011 7:48 PM

## 2011-07-11 NOTE — OR Nursing (Signed)
[place] Policy does not require an updated note from surgeon if patient is a continuous inpatient prior to day of surgery.

## 2011-07-11 NOTE — OR Nursing (Signed)
Unit 2300 called at removal of retractor at 12:43, skin sutures at 13:07 and patient exit. Pt family called at removal of retractor at 12:43.

## 2011-07-11 NOTE — Transfer of Care (Signed)
Immediate Anesthesia Transfer of Care Note  Patient: [name]  Procedure(s) Performed: Procedure(s) (LRB): CORONARY ARTERY BYPASS GRAFTING (CABG) (N/A)  Patient Location: SICU  Anesthesia Type: General  Level of Consciousness: sedated and unresponsive  Airway & Oxygen Therapy: Patient remains intubated per anesthesia plan and Patient placed on Ventilator (see vital sign flow sheet for setting)  Post-op Assessment: Post -op Vital signs reviewed and stable  Post vital signs: Reviewed and stable  Complications: No apparent anesthesia complications

## 2011-07-11 NOTE — Preoperative (Signed)
Beta Blockers   Reason not to administer Beta Blockers:Not Applicable 

## 2011-07-12 ENCOUNTER — Encounter (HOSPITAL_COMMUNITY): Payer: Self-pay | Admitting: Cardiothoracic Surgery

## 2011-07-12 ENCOUNTER — Inpatient Hospital Stay (HOSPITAL_COMMUNITY): Payer: Medicare Other

## 2011-07-12 LAB — POCT I-STAT, CHEM 8
Calcium, Ion: 1.24 mmol/L (ref 1.12–1.32)
Chloride: 103 mEq/L (ref 96–112)
Glucose, Bld: 133 mg/dL — ABNORMAL HIGH (ref 70–99)
HCT: 36 % — ABNORMAL LOW (ref 39.0–52.0)
Hemoglobin: 12.2 g/dL — ABNORMAL LOW (ref 13.0–17.0)
TCO2: 23 mmol/L (ref 0–100)

## 2011-07-12 LAB — GLUCOSE, CAPILLARY: Glucose-Capillary: 111 mg/dL — ABNORMAL HIGH (ref 70–99)

## 2011-07-12 LAB — MAGNESIUM
Magnesium: 2.8 mg/dL — ABNORMAL HIGH (ref 1.5–2.5)
Magnesium: 2.6 mg/dL — ABNORMAL HIGH (ref 1.5–2.5)

## 2011-07-12 LAB — BASIC METABOLIC PANEL
CO2: 20 mEq/L (ref 19–32)
Calcium: 8.1 mg/dL — ABNORMAL LOW (ref 8.4–10.5)
GFR calc non Af Amer: 39 mL/min — ABNORMAL LOW (ref >90)
Potassium: 4.3 mEq/L (ref 3.5–5.1)
Sodium: 137 mEq/L (ref 135–145)

## 2011-07-12 LAB — CBC
HCT: 35.7 % — ABNORMAL LOW (ref 39.0–52.0)
Hemoglobin: 11.8 g/dL — ABNORMAL LOW (ref 13.0–17.0)
Hemoglobin: 11.5 g/dL — ABNORMAL LOW (ref 13.0–17.0)
MCH: 27.9 pg (ref 26.0–34.0)
MCHC: 32.5 g/dL (ref 30.0–36.0)
Platelets: 160 10*3/uL (ref 150–400)
RBC: 4.23 MIL/uL (ref 4.22–5.81)
RBC: 4.09 MIL/uL — ABNORMAL LOW (ref 4.22–5.81)
RDW: 15.8 % — ABNORMAL HIGH (ref 11.5–15.5)
WBC: 13.6 10*3/uL — ABNORMAL HIGH (ref 4.0–10.5)

## 2011-07-12 LAB — CREATININE, SERUM
GFR calc Af Amer: 47 mL/min — ABNORMAL LOW (ref >90)
GFR calc non Af Amer: 40 mL/min — ABNORMAL LOW (ref >90)

## 2011-07-12 MED ORDER — ACETAMINOPHEN 500 MG PO TABS
1000.0000 mg | ORAL_TABLET | Freq: Four times a day (QID) | ORAL | Status: AC
  Administered 2011-07-13 – 2011-07-16 (×13): 1000 mg via ORAL
  Filled 2011-07-12 (×13): qty 2

## 2011-07-12 MED ORDER — INSULIN ASPART 100 UNIT/ML ~~LOC~~ SOLN
0.0000 [IU] | SUBCUTANEOUS | Status: DC
  Administered 2011-07-12: 2 [IU] via SUBCUTANEOUS

## 2011-07-12 MED ORDER — ACETAMINOPHEN 160 MG/5ML PO SOLN
975.0000 mg | Freq: Four times a day (QID) | ORAL | Status: AC
  Filled 2011-07-12: qty 40.6

## 2011-07-12 MED ORDER — INSULIN ASPART 100 UNIT/ML ~~LOC~~ SOLN: 0.0000 [IU] | SUBCUTANEOUS | Status: DC

## 2011-07-12 NOTE — Progress Notes (Addendum)
[address]            [name] [address]          [phone]     1 Day Post-Op Procedure(s) (LRB): CORONARY ARTERY BYPASS GRAFTING (CABG) (N/A)  Subjective: Mild nausea today, sore in chest.  Overall, feels ok.  Objective: Vital signs in last 24 hours: Patient Vitals for the past 24 hrs:  BP Temp Temp src Pulse Resp SpO2 Weight  07/12/11 1100 111/46 mmHg 98.1 F (36.7 C) Core 89  23  95 % -  07/12/11 1045 - 98.1 F (36.7 C) Core 89  24  98 % -  07/12/11 1030 - 98.1 F (36.7 C) Core 90  20  98 % -  07/12/11 1015 - 97.9 F (36.6 C) Core 89  21  99 % -  07/12/11 1000 102/44 mmHg 98.1 F (36.7 C) Core 89  22  98 % -  07/12/11 0945 - 98.2 F (36.8 C) Core 89  16  99 % -  07/12/11 0930 - 98.1 F (36.7 C) Core 89  19  98 % -  07/12/11 0915 - 98.1 F (36.7 C) Core 89  17  99 % -  07/12/11 0900 97/48 mmHg 98.1 F (36.7 C) Core 89  20  98 % -  07/12/11 0845 - 98.1 F (36.7 C) Core 89  17  98 % -  07/12/11 0830 - 98.1 F (36.7 C) Core 89  20  98 % -  07/12/11 0815 - 98.1 F (36.7 C) Core 89  15  100 % -  07/12/11 0800 104/48 mmHg 97.9 F (36.6 C) Core 89  18  97 % -  07/12/11 0730 - 98.1 F (36.7 C) Core - - - -  07/12/11 0700 95/42 mmHg 98.1 F (36.7 C) - 89  17  100 % -  07/12/11 0620 - 98.1 F (36.7 C) - 89  17  100 % -  07/12/11 0600 118/51 mmHg 98.1 F (36.7 C) - 89  23  96 % -  07/12/11 0545 - 98.2 F (36.8 C) - 89  26  100 % -  07/12/11 0530 - 98.2 F (36.8 C) - 89  15  100 % -  07/12/11 0515 - 97.7 F (36.5 C) - 89  16  100 % -  07/12/11 0500 99/41 mmHg 98.4 F (36.9 C) - 89  16  100 % -  07/12/11 0445 - 98.4 F (36.9 C) - 89  17  100 % -  07/12/11 0430 - 98.4 F (36.9 C) - 90  19  99 % -  07/12/11 0415 - 98.4 F (36.9 C) - 89  18  100 % -  07/12/11 0400 92/43 mmHg 98.6 F (37 C) - 89  15  100 % -  07/12/11 0345 - 98.8 F (37.1 C) - 89  18  100 % -  07/12/11 0339 - 98.8 F (37.1 C) - 89  22  100 % -  07/12/11  0320 - 98.8 F (37.1 C) - 90  20  99 % 72 kg (158 lb 11.7 oz)  07/12/11 0300 85/39 mmHg 99 F (37.2 C) - 89  16  100 % -  07/12/11 0200 101/43 mmHg 99.1 F (37.3 C) - 89  19  100 % -  07/12/11 0145 - 99.1 F (37.3 C) - 89  19  100 % -  07/12/11 0130 - 99.3 F (37.4 C) - 89  23  100 % -  07/12/11 0115 - 99.3 F (37.4 C) - 89  21  100 % -  07/12/11 0100 95/42 mmHg 99.3 F (37.4 C) - 89  19  100 % -  07/12/11 0045 - 99.3 F (37.4 C) - 89  18  100 % -  07/12/11 0030 - 99.3 F (37.4 C) - 89  18  100 % -  07/12/11 0021 - 99.5 F (37.5 C) - 89  22  100 % -  07/12/11 0000 89/43 mmHg 99.5 F (37.5 C) Core 89  25  100 % -  07/11/11 2300 88/38 mmHg 99.5 F (37.5 C) Core 89  19  99 % -  07/11/11 2200 99/43 mmHg 99.3 F (37.4 C) Core 72  21  92 % -  07/11/11 2100 91/46 mmHg 99.1 F (37.3 C) Core 89  20  98 % -  07/11/11 2010 - 98.8 F (37.1 C) - 90  19  98 % -  07/11/11 2000 87/38 mmHg 98.8 F (37.1 C) Core 90  13  98 % -  07/11/11 1900 94/41 mmHg - - 90  14  96 % -  07/11/11 1845 88/63 mmHg 98.2 F (36.8 C) Core 89  20  90 % -  07/11/11 1830 96/48 mmHg 98.2 F (36.8 C) Core 89  14  95 % -  07/11/11 1815 94/50 mmHg 98.2 F (36.8 C) Core 90  20  96 % -  07/11/11 1800 95/51 mmHg 98.1 F (36.7 C) Core 89  16  96 % -  07/11/11 1745 98/45 mmHg 98.1 F (36.7 C) Core 90  13  97 % -  07/11/11 1730 106/46 mmHg 98.1 F (36.7 C) Core 89  18  94 % -  07/11/11 1715 113/50 mmHg 97.9 F (36.6 C) Core 89  25  96 % -  07/11/11 1700 107/52 mmHg 97.9 F (36.6 C) Core 90  12  99 % -  07/11/11 1645 125/76 mmHg 97.9 F (36.6 C) Core 90  14  98 % -  07/11/11 1630 105/52 mmHg 97.7 F (36.5 C) Core 90  13  99 % -  07/11/11 1625 126/56 mmHg - - 90  14  99 % -  07/11/11 1615 96/50 mmHg 98.1 F (36.7 C) Core 80  24  100 % -  07/11/11 1600 - 98.1 F (36.7 C) Core 80  22  100 % -  07/11/11 1545 - 97.9 F (36.6 C) Core 80  15  100 % -  07/11/11 1530 101/52 mmHg 97.9 F (36.6 C) Core 80  20  100 % -    07/11/11 1515 101/54 mmHg 97.7 F (36.5 C) Core 80  22  99 % -  07/11/11 1500 98/50 mmHg 97.5 F (36.4 C) Core 80  23  99 % -  07/11/11 1445 91/51 mmHg 97.3 F (36.3 C) Core 80  21  98 % -  07/11/11 1430 95/51 mmHg 97.3 F (36.3 C) Core 80  21  98 % -  07/11/11 1415 89/51 mmHg 97.2 F (36.2 C) Core 80  10  96 % -  07/11/11 1400 80/44 mmHg 97.2 F (36.2 C) Core 90  12  96 % -  07/11/11 1350 99/53 mmHg - - 89  12  100 % -   Current Weight  07/12/11 72 kg (158 lb 11.7 oz)  PRE-OPERATIVE WEIGHT:  69kg  Drips: Dopamine 3.9 ml/hr Milrinone 7.8 ml/hr Neo 45 ml/hr  Intake/Output from previous day: 03/18 0701 - 03/19 0700 In: 6701.1 [P.O.:170; I.V.:4699.1; Blood:920; NG/GT:30; IV Piggyback:882] Out: 3819 [Urine:2325; Blood:1250; Chest Tube:244]  CBGs 127-115-105-105-119-132  PHYSICAL EXAM:  Heart: AAI @90  Lungs: diminished BS in bases  Wound: dressed and dry Extremities: no edema  Lab Results: CBC: Basename 07/12/11 0334 07/11/11 1824 07/11/11 1815  WBC 13.2* -- 15.6*  HGB 11.8* 13.9 --  HCT 36.3* 41.0 --  PLT 160 -- 177   BMET:  Basename 07/12/11 0334 07/11/11 1824 07/11/11 0351  NA 137 139 --  K 4.3 4.7 --  CL 105 108 --  CO2 20 -- 24  GLUCOSE 132* 127* --  BUN 27* 28* --  CREATININE 1.69* 1.60* --  CALCIUM 8.1* -- 9.0    PT/INR:  Basename 07/11/11 1402  LABPROT 20.2*  INR 1.69*   CXR- bilateral atx, small L pleural effusion  Assessment/Plan: S/P Procedure(s) (LRB): CORONARY ARTERY BYPASS GRAFTING (CABG) (N/A) CV- BPs low requiring Dop/Neo/Milrinone.  Lopressor on hold.  H/o L subclavian stenosis and general venous insufficiency, so pressures may not be accurate.  Continue AAI pacing for now, wean gtts as tolerated. ARI- Cr up slightly post-op (had been 1.4-1.5 pre-op).  Continue to monitor. CT output low, will d/c CTs this am. CBGs stable, no h/o DM.  Continue SSI and watch. Mobilize, routine progression.   LOS: 5 days    COLLINS,GINA  H 07/12/2011   will wean MIL and neo,cont renal dopa Lasix later when BP better

## 2011-07-12 NOTE — Op Note (Signed)
[name], [name] NO.:  [id number]  MEDICAL RECORD NO.:  [id number]  LOCATION:  2301                         FACILITY:  [facility]  PHYSICIAN:  Sheliah Plane, MD    DATE OF BIRTH:  02-11-1941  DATE OF PROCEDURE:  07/11/2011 DATE OF DISCHARGE:                              OPERATIVE REPORT   PREOPERATIVE DIAGNOSIS:  Critical three-vessel coronary artery disease with left main and severe left ventricular dysfunction.  POSTOPERATIVE DIAGNOSIS:  Critical three-vessel coronary artery disease with left main and severe left ventricular dysfunction.  SURGICAL PROCEDURE:  Coronary artery bypass grafting x3 with free left internal mammary artery graft to the left anterior descending, reverse saphenous vein graft to the obtuse marginal, reverse saphenous vein graft to the posterior descending with right lower leg open endo vein harvesting, and incidental mediastinal lymph node biopsy.  SURGEON:  Sheliah Plane, MD  FIRST ASSISTANT:  Rowe Clack, PA-C  BRIEF HISTORY:  The patient is a 71 year old male, long-term smoker with known cerebrovascular disease, and peripheral vascular disease, and history of bilateral greater saphenous vein ablation for varicosities who in the summer of 2012 was noted to have normal LV function.  In December, after a prolonged episode of shortness of breath on Christmas Day developed progressive symptoms of congestive heart failure, ultimately was evaluated with echocardiogram which demonstrated severe LV dysfunction.  Cardiac catheterization was performed by Dr. Mariah Milling which showed critical left main disease, high-grade right coronary artery disease, proximal circumflex and LAD disease, and approximately 20% ejection fraction.  He also has known left subclavian stenosis both proximal and distal to the takeoff of the left internal mammary with approximately 30 mm gradient between the left and right arms.  In addition, the patient gives a  history of having radiofrequency ablation of both greater saphenous veins, both radial arteries were the primary supplier of the palmar arch.  With the patient's numerous risk factors including baseline elevated creatinine, high risk coronary artery bypass grafting was discussed with the patient and his family.  He was agreeable with proceeding and signed informed consent.  DESCRIPTION OF PROCEDURE:  With Swan-Ganz and arterial line monitors in place, the patient underwent general endotracheal anesthesia.  Dr. Krista Blue placed a TEE probe which confirmed severe LV dysfunction. Specific findings are dictated under separate note.  The skin of the chest and legs was prepped with Betadine and draped in usual sterile manner.  A vein mapping was done preoperatively.  Based on this, it appeared that there was some usable vein in the right lower leg, this was taken with open technique and turned out to be very satisfactory vein.  In addition, median sternotomy was performed.  Left internal mammary artery was dissected down initially as a pedicle graft.  The mammary artery appeared to be of reasonable size.  There were some enlarged lymph nodes noted on the patient's preop CT scan.  There were none.  These lymph nodes were nonspecific but one was found along the left internal chain of nodes and was sent incidentally as a lymph node biopsy.  Because of the left subclavian stenosis, it was decided to use the left internal mammary artery as a free  graft proximally and distally.  This was divided and flushed __________ solution. Pericardium was opened and as suggested on ventriculogram and echo the patient had severe LV dysfunction and right ventricular enlargement.  He was systemically heparinized.  Ascending aorta was cannulated.  The right atrium was cannulated.  Retrograde cardioplegia catheter was placed into the coronary sinus.  The patient was placed on cardiopulmonary bypass 2.4 L/min/m2.  Sites  of anastomosis were dissected out of the epicardium and an aortic root vent cardioplegia needle was introduced into the ascending aorta.  The patient's body temperature was cooled to 32 degrees.  Aortic crossclamp was applied. 500 mL cold blood potassium cardioplegia was administered antegrade. Additional cold blood cardioplegia was administered retrograde. Attention was turned first to the posterior descending coronary artery which was opened and was a reasonable size vessel admitted a 1.5 mm probe, relatively free of disease at the site of anastomosis.  The heart was then elevated and the first obtuse marginal, which was a large vessel, 1.8 mm in size was opened and was of good quality.  Using a running 7-0 Prolene, distal anastomosis was performed.  Attention was then turned to the mid left anterior descending coronary artery which was opened and admitted a 1.5 mm probe distally.  Using a running 8-0 Prolene, the free mammary graft was anastomosed to the LAD.  With the crossclamp still in place, 2 punch aortotomies were performed.  Each of the 2 vein grafts were anastomosed to the ascending aorta.  The free mammary graft was then brought off the hood of the vein graft to the obtuse marginal.  Prior to completion of these anastomoses, the heart was allowed to passively fill and de-air.  Aortic crossclamp was then removed with a total crossclamp time of 86 minutes.  The patient required electric defibrillation to return to a sinus rhythm.  He was atrially paced to increase his rate.  Because of this, a low-dose dopamine and milrinone infusions were started with the body temperature rewarmed to 37 degrees and the anastomotic sites free of bleeding.  He was then ventilated and weaned from cardiopulmonary bypass.  He remained hemodynamically stable.  He was decannulated in usual fashion. Protamine sulfate was administered with the operative field hemostatic. Two Blake mediastinal drains  were left in place.  The left pleural space was obliterated from previous adhesions, so no chest tube was placed on the side.  Pericardium was loosely reapproximated.  Sternum closed with #6 stainless steel wire.  Fascia closed with interrupted 0 Vicryl, running 3-0 Vicryl in subcutaneous tissue, 4-0 subcuticular stitch in skin edges.  Dry dressings were applied.  Sponge and needle count was reported as correct at the completion of procedure.  The patient tolerated the procedure without obvious complication and was transferred to Surgical Intensive Care unit for further postoperative care.  The TEE showed slight improvement in left ventricular function especially of the lateral wall.  The patient did not require any blood bank blood products during the operative procedure.  Total pump time was 113 minutes.     Sheliah Plane, MD     EG/MEDQ  D:  07/12/2011  T:  07/12/2011  Job:  [id number]  cc:   [name], MD

## 2011-07-12 NOTE — Progress Notes (Signed)
CABG yesterday, EF 25%  Maintaining sinus rhythm weaning inotropes  Continuing low-dose dopamine and neo- Out of bed to chair, P. and labs satisfactory Plan start Lasix diuresis tomorrow when blood pressure better

## 2011-07-12 NOTE — Progress Notes (Signed)
UR Completed.  Edelmiro Innocent Jane [phone] 07/12/2011  

## 2011-07-12 NOTE — Progress Notes (Signed)
UR Completed.  Camrin Gearheart Jane [phone] 07/12/2011  

## 2011-07-13 ENCOUNTER — Ambulatory Visit: Payer: Medicare Other | Admitting: Family Medicine

## 2011-07-13 ENCOUNTER — Institutional Professional Consult (permissible substitution): Payer: Medicare Other | Admitting: Pulmonary Disease

## 2011-07-13 ENCOUNTER — Inpatient Hospital Stay (HOSPITAL_COMMUNITY): Payer: Medicare Other

## 2011-07-13 LAB — GLUCOSE, CAPILLARY
Glucose-Capillary: 96 mg/dL (ref 70–99)
Glucose-Capillary: 118 mg/dL — ABNORMAL HIGH (ref 70–99)
Glucose-Capillary: 97 mg/dL (ref 70–99)

## 2011-07-13 LAB — BASIC METABOLIC PANEL
BUN: 30 mg/dL — ABNORMAL HIGH (ref 6–23)
BUN: 34 mg/dL — ABNORMAL HIGH (ref 6–23)
CO2: 23 mEq/L (ref 19–32)
CO2: 24 mEq/L (ref 19–32)
Calcium: 8.8 mg/dL (ref 8.4–10.5)
Calcium: 9.3 mg/dL (ref 8.4–10.5)
Chloride: 95 mEq/L — ABNORMAL LOW (ref 96–112)
Creatinine, Ser: 1.62 mg/dL — ABNORMAL HIGH (ref 0.50–1.35)
Creatinine, Ser: 1.68 mg/dL — ABNORMAL HIGH (ref 0.50–1.35)
GFR calc Af Amer: 48 mL/min — ABNORMAL LOW (ref >90)
GFR calc Af Amer: 46 mL/min — ABNORMAL LOW (ref >90)
GFR calc non Af Amer: 40 mL/min — ABNORMAL LOW (ref >90)
Glucose, Bld: 108 mg/dL — ABNORMAL HIGH (ref 70–99)
Potassium: 4.9 mEq/L (ref 3.5–5.1)
Sodium: 130 mEq/L — ABNORMAL LOW (ref 135–145)

## 2011-07-13 LAB — CBC
MCHC: 32.4 g/dL (ref 30.0–36.0)
MCV: 85.8 fL (ref 78.0–100.0)
Platelets: 133 10*3/uL — ABNORMAL LOW (ref 150–400)
RDW: 15.6 % — ABNORMAL HIGH (ref 11.5–15.5)
WBC: 14.4 10*3/uL — ABNORMAL HIGH (ref 4.0–10.5)

## 2011-07-13 MED ORDER — AMIODARONE HCL 200 MG PO TABS
400.0000 mg | ORAL_TABLET | Freq: Two times a day (BID) | ORAL | Status: DC
  Administered 2011-07-13 – 2011-07-14 (×4): 400 mg via ORAL
  Filled 2011-07-13 (×6): qty 2

## 2011-07-13 MED ORDER — AMIODARONE HCL IN DEXTROSE 360-4.14 MG/200ML-% IV SOLN
INTRAVENOUS | Status: AC
  Administered 2011-07-13: 150 mg
  Filled 2011-07-13: qty 200

## 2011-07-13 MED ORDER — ENOXAPARIN SODIUM 30 MG/0.3ML ~~LOC~~ SOLN
30.0000 mg | SUBCUTANEOUS | Status: DC
  Administered 2011-07-13 – 2011-07-22 (×10): 30 mg via SUBCUTANEOUS
  Filled 2011-07-13 (×10): qty 0.3

## 2011-07-13 MED ORDER — AMIODARONE IV BOLUS ONLY 150 MG/100ML: 150.0000 mg | Freq: Once | INTRAVENOUS | Status: DC

## 2011-07-13 MED ORDER — CALCIUM CHLORIDE 10 % IV SOLN
1.0000 g | Freq: Once | INTRAVENOUS | Status: AC
  Administered 2011-07-13: 1 g via INTRAVENOUS
  Filled 2011-07-13: qty 10

## 2011-07-13 MED ORDER — DEXTROSE 5 % IV SOLN
0.0000 ug/min | INTRAVENOUS | Status: DC
  Filled 2011-07-13: qty 2

## 2011-07-13 MED ORDER — AMIODARONE HCL IN DEXTROSE 360-4.14 MG/200ML-% IV SOLN
0.5000 mg/min | INTRAVENOUS | Status: DC
  Administered 2011-07-13: 0.5 mg/min via INTRAVENOUS
  Filled 2011-07-13 (×3): qty 200

## 2011-07-13 MED ORDER — ALBUMIN HUMAN 5 % IV SOLN
INTRAVENOUS | Status: AC
  Filled 2011-07-13: qty 250

## 2011-07-13 MED ORDER — ALBUMIN HUMAN 5 % IV SOLN
12.5000 g | Freq: Once | INTRAVENOUS | Status: AC
  Administered 2011-07-13: 12.5 g via INTRAVENOUS

## 2011-07-13 MED ORDER — AMIODARONE HCL IN DEXTROSE 360-4.14 MG/200ML-% IV SOLN
1.0000 mg/min | INTRAVENOUS | Status: AC
  Administered 2011-07-13: 1 mg/min via INTRAVENOUS
  Filled 2011-07-13: qty 200

## 2011-07-13 MED ORDER — FUROSEMIDE 10 MG/ML IJ SOLN
40.0000 mg | Freq: Once | INTRAMUSCULAR | Status: AC
  Administered 2011-07-13: 40 mg via INTRAVENOUS
  Filled 2011-07-13: qty 4

## 2011-07-13 MED ORDER — AMIODARONE LOAD VIA INFUSION
150.0000 mg | Freq: Once | INTRAVENOUS | Status: AC
  Administered 2011-07-13: 150 mg via INTRAVENOUS
  Filled 2011-07-13: qty 83.34

## 2011-07-13 MED FILL — Potassium Chloride Inj 2 mEq/ML: INTRAVENOUS | Qty: 40 | Status: AC

## 2011-07-13 MED FILL — Verapamil HCl IV Soln 2.5 MG/ML: INTRAVENOUS | Qty: 4 | Status: AC

## 2011-07-13 MED FILL — Nitroglycerin IV Soln 5 MG/ML: INTRAVENOUS | Qty: 10 | Status: AC

## 2011-07-13 MED FILL — Magnesium Sulfate Inj 50%: INTRAMUSCULAR | Qty: 10 | Status: AC

## 2011-07-13 MED FILL — Lactated Ringer's Solution: INTRAVENOUS | Qty: 500 | Status: AC

## 2011-07-13 MED FILL — Heparin Sodium (Porcine) Inj 1000 Unit/ML: INTRAMUSCULAR | Qty: 1 | Status: AC

## 2011-07-13 NOTE — Progress Notes (Signed)
Dr. Zenaida Niece trigt called re: conversion to afib, current vital signs; orders received for Amio bolus and gtt

## 2011-07-13 NOTE — Progress Notes (Signed)
   CARE MANAGEMENT NOTE 07/13/2011  Patient:  [name] [name],[name]   Account Number:  [id number]  Date Initiated:  07/12/2011  Documentation initiated by:  [facility]  Subjective/Objective Assessment:   Admitted for Cardiac cath with LM disease.  Has LLL mass.  Has Spouse.     Action/Plan:   PTA, PT INDEPENDENT, LIVES WITH SPOUSE, WHO IS LEGALLY BLIND.   Anticipated DC Date:  07/19/2011   Anticipated DC Plan:  HOME W HOME HEALTH SERVICES      DC Planning Services  CM consult      Choice offered to / List presented to:             Status of service:  In process, will continue to follow Medicare Important Message given?   (If response is "NO", the following Medicare IM given date fields will be blank) Date Medicare IM given:   Date Additional Medicare IM given:    Discharge Disposition:    Per UR Regulation:  Reviewed for med. necessity/level of care/duration of stay  If discussed at Long Length of Stay Meetings, dates discussed:    Comments:  07/13/11 [name] Deguire,RN,BSN 1100 MET WITH PT AND WIFE TO DISCUSS DC PLANS.  WIFE STATES THEY HAVE 3 ADULT CHILDREN WHO WILL ASSIST AT HOME AS WELL.  SHE MAY BE INTERESTED IN HHRN AT DISCHARGE TO FOLLOW UP AT HOME.  WILL FOLLOW PT FOR HOME NEEDS AS HE PROGRESSES. Phone #[phone]

## 2011-07-13 NOTE — Progress Notes (Signed)
Patient ID: [name], male   DOB: [birth year], 71 y.o.   MRN: [id number] Stable day Still with some a fib/ flutter BP 117/64  Pulse 142  Temp(Src) 97.4 F (36.3 C) (Oral)  Resp 27  Ht 5\' 9"  (1.753 m)  Wt 163 lb 2.3 oz (74 kg)  BMI 24.09 kg/m2  SpO2 92% Up in chair eating dinner

## 2011-07-13 NOTE — Plan of Care (Signed)
Problem: Problem: Cardiovascular Progression Goal: HEMODYNAMIC STABILITY Outcome: Progressing Pt still on vasoactive gtts, afib/aflutter    Goal: NO ARRHYTHMIAS Outcome: Not Progressing afib/aflutter

## 2011-07-13 NOTE — Progress Notes (Signed)
2 Days Post-Op Procedure(s) (LRB): CORONARY ARTERY BYPASS GRAFTING (CABG) (N/A) Subjective:                        [address]            [place] [address]          [phone]     S/P CABG ICM EF .25 Sinus tach on Dopa with Hb 12.5 CXR wet- no lasix yet due to pressors IV amio to po dosing today copnt drip wean and start diuresis  Objective: Vital signs in last 24 hours: Temp:  [96.2 F (35.7 C)-98.2 F (36.8 C)] 96.2 F (35.7 C) (03/20 0734) Pulse Rate:  [54-127] 117  (03/20 0845) Cardiac Rhythm:  [-] Atrial flutter (03/20 0800) Resp:  [16-34] 21  (03/20 0845) BP: (64-134)/(37-107) 113/74 mmHg (03/20 0845) SpO2:  [89 %-100 %] 97 % (03/20 0845) Arterial Line BP: (45-64)/(38-57) 45/38 mmHg (03/19 1230) Weight:  [163 lb 2.3 oz (74 kg)] 163 lb 2.3 oz (74 kg) (03/20 0500)  Hemodynamic parameters for last 24 hours: PAP: (35-49)/(14-24) 40/15 mmHg CO:  [4 L/min-4.1 L/min] 4 L/min CI:  [2.2 L/min/m2] 2.2 L/min/m2  Intake/Output from previous day: 03/19 0701 - 03/20 0700 In: 2470.6 [P.O.:360; I.V.:2010.6; IV Piggyback:100] Out: 1175 [Urine:1075; Chest Tube:100] Intake/Output this shift: Total I/O In: 90.1 [I.V.:90.1] Out: 45 [Urine:45]  EXAM OOB to chair extrem warm,abd soft Neuro intact  Lab Results:  Basename 07/13/11 0420 07/12/11 1804 07/12/11 1700  WBC 14.4* -- 13.6*  HGB 12.1* 12.2* --  HCT 37.4* 36.0* --  PLT 133* -- 137*   BMET:  Basename 07/13/11 0420 07/12/11 1804 07/12/11 0334  NA 132* 137 --  K 4.5 4.5 --  CL 101 103 --  CO2 23 -- 20  GLUCOSE 123* 133* --  BUN 30* 29* --  CREATININE 1.62* 1.70* --  CALCIUM 8.8 -- 8.1*    PT/INR:  Basename 07/11/11 1402  LABPROT 20.2*  INR 1.69*   ABG    Component Value Date/Time   PHART 7.256* 07/11/2011 1820   HCO3 21.6 07/11/2011 1820   TCO2 23 07/12/2011 1804   ACIDBASEDEF 6.0* 07/11/2011 1820   O2SAT 89.0 07/11/2011 1820   CBG (last 3)   Basename 07/13/11 0731 07/13/11 0322  07/12/11 2328  GLUCAP 108* 118* 96    Assessment/Plan: S/P Procedure(s) (LRB): CORONARY ARTERY BYPASS GRAFTING (CABG) (N/A) Diuresis,mobilize   LOS: 6 days    [name],[name] 07/13/2011

## 2011-07-14 ENCOUNTER — Inpatient Hospital Stay (HOSPITAL_COMMUNITY): Payer: Medicare Other

## 2011-07-14 LAB — GLUCOSE, CAPILLARY
Glucose-Capillary: 98 mg/dL (ref 70–99)
Glucose-Capillary: 82 mg/dL (ref 70–99)
Glucose-Capillary: 89 mg/dL (ref 70–99)
Glucose-Capillary: 93 mg/dL (ref 70–99)
Glucose-Capillary: 92 mg/dL (ref 70–99)

## 2011-07-14 LAB — COMPREHENSIVE METABOLIC PANEL
ALT: 195 U/L — ABNORMAL HIGH (ref 0–53)
BUN: 39 mg/dL — ABNORMAL HIGH (ref 6–23)
CO2: 21 mEq/L (ref 19–32)
Calcium: 9.1 mg/dL (ref 8.4–10.5)
Creatinine, Ser: 1.74 mg/dL — ABNORMAL HIGH (ref 0.50–1.35)
GFR calc Af Amer: 44 mL/min — ABNORMAL LOW (ref >90)
GFR calc non Af Amer: 38 mL/min — ABNORMAL LOW (ref >90)
Glucose, Bld: 92 mg/dL (ref 70–99)
Total Protein: 6 g/dL (ref 6.0–8.3)

## 2011-07-14 LAB — CBC
Hemoglobin: 12.6 g/dL — ABNORMAL LOW (ref 13.0–17.0)
MCH: 27.9 pg (ref 26.0–34.0)
MCHC: 32.6 g/dL (ref 30.0–36.0)
MCV: 85.4 fL (ref 78.0–100.0)
RBC: 4.52 MIL/uL (ref 4.22–5.81)

## 2011-07-14 MED ORDER — POTASSIUM CHLORIDE CRYS ER 10 MEQ PO TBCR
10.0000 meq | EXTENDED_RELEASE_TABLET | Freq: Every day | ORAL | Status: DC
  Administered 2011-07-14: 10 meq via ORAL
  Filled 2011-07-14 (×2): qty 1

## 2011-07-14 MED ORDER — SODIUM CHLORIDE 0.9 % IV SOLN
INTRAVENOUS | Status: DC
  Administered 2011-07-14: 02:00:00 via INTRAVENOUS

## 2011-07-14 MED ORDER — INSULIN ASPART 100 UNIT/ML ~~LOC~~ SOLN: 0.0000 [IU] | Freq: Three times a day (TID) | SUBCUTANEOUS | Status: DC

## 2011-07-14 MED ORDER — FUROSEMIDE 10 MG/ML IJ SOLN
40.0000 mg | Freq: Once | INTRAMUSCULAR | Status: AC
  Administered 2011-07-14: 40 mg via INTRAVENOUS
  Filled 2011-07-14: qty 4

## 2011-07-14 MED ORDER — FUROSEMIDE 40 MG PO TABS
40.0000 mg | ORAL_TABLET | Freq: Every day | ORAL | Status: DC
  Filled 2011-07-14: qty 1

## 2011-07-14 MED ORDER — SODIUM CHLORIDE 0.9 % IV SOLN: 250.0000 mL | INTRAVENOUS | Status: DC | PRN

## 2011-07-14 MED ORDER — SODIUM CHLORIDE 0.9 % IJ SOLN
3.0000 mL | Freq: Two times a day (BID) | INTRAMUSCULAR | Status: DC
  Administered 2011-07-14 – 2011-07-22 (×13): 3 mL via INTRAVENOUS

## 2011-07-14 MED ORDER — SIMVASTATIN 20 MG PO TABS
20.0000 mg | ORAL_TABLET | Freq: Every day | ORAL | Status: DC
  Administered 2011-07-14 – 2011-07-18 (×5): 20 mg via ORAL
  Filled 2011-07-14 (×6): qty 1

## 2011-07-14 MED ORDER — MOVING RIGHT ALONG BOOK
Freq: Once | Status: AC
  Administered 2011-07-14: 11:00:00
  Filled 2011-07-14: qty 1

## 2011-07-14 MED ORDER — SODIUM CHLORIDE 0.9 % IJ SOLN: 3.0000 mL | INTRAMUSCULAR | Status: DC | PRN

## 2011-07-14 MED ORDER — FUROSEMIDE 40 MG PO TABS: 40.0000 mg | ORAL_TABLET | Freq: Every day | ORAL | Status: DC

## 2011-07-14 NOTE — Progress Notes (Signed)
Received pt into 2016 at 1515.  Oriented to room and unit, and placed on monitor.  Wife notified of new room.

## 2011-07-14 NOTE — Progress Notes (Signed)
3 Days Post-Op Procedure(s) (LRB): CORONARY ARTERY BYPASS GRAFTING (CABG) (N/A) Subjective:remained in NSR Feels better off drips Appetite low  Objective: Vital signs in last 24 hours: Temp:  [97 F (36.1 C)-97.9 F (36.6 C)] 97.6 F (36.4 C) (03/21 0746) Pulse Rate:  [39-146] 72  (03/21 0730) Cardiac Rhythm:  [-] Normal sinus rhythm (03/21 0400) Resp:  [13-27] 21  (03/21 0730) BP: (80-135)/(45-86) 121/51 mmHg (03/21 0700) SpO2:  [89 %-100 %] 100 % (03/21 0730) FiO2 (%):  [50 %] 50 % (03/20 1000) Weight:  [167 lb 5.3 oz (75.9 kg)] 167 lb 5.3 oz (75.9 kg) (03/21 0330)  Hemodynamic parameters for last 24 hours: CVP:  [2 mmHg-22 mmHg] 7 mmHg  Intake/Output from previous day: 03/20 0701 - 03/21 0700 In: 1836.1 [P.O.:750; I.V.:832.1; IV Piggyback:254] Out: 855 [Urine:855] Intake/Output this shift:    EXAM-lungs w/ diminished bs            extrem warm            abd soft  Lab Results:  Basename 07/14/11 0330 07/13/11 0420  WBC 13.7* 14.4*  HGB 12.6* 12.1*  HCT 38.6* 37.4*  PLT 141* 133*   BMET:  Basename 07/14/11 0330 07/13/11 1525  NA 129* 130*  K 4.8 4.9  CL 96 95*  CO2 21 24  GLUCOSE 92 108*  BUN 39* 34*  CREATININE 1.74* 1.68*  CALCIUM 9.1 9.3    PT/INR:  Basename 07/11/11 1402  LABPROT 20.2*  INR 1.69*   ABG    Component Value Date/Time   PHART 7.256* 07/11/2011 1820   HCO3 21.6 07/11/2011 1820   TCO2 23 07/12/2011 1804   ACIDBASEDEF 6.0* 07/11/2011 1820   O2SAT 89.0 07/11/2011 1820   CBG (last 3)   Basename 07/14/11 0327 07/13/11 2322 07/13/11 1922  GLUCAP 82 90 111*    Assessment/Plan: S/P Procedure(s) (LRB): CORONARY ARTERY BYPASS GRAFTING (CABG) (N/A) Plan for transfer to step-down: see transfer orders Watch elevated creatinine  1.7   LOS: 7 days    VAN TRIGT III,Emmamarie Kluender 07/14/2011

## 2011-07-14 NOTE — Plan of Care (Signed)
Problem: Phase III Progression Outcomes Goal: Time patient transferred to PCTU/Telemetry POD Transferred to 2016 at 1515pm

## 2011-07-15 ENCOUNTER — Inpatient Hospital Stay (HOSPITAL_COMMUNITY): Payer: Medicare Other

## 2011-07-15 LAB — BASIC METABOLIC PANEL
BUN: 57 mg/dL — ABNORMAL HIGH (ref 6–23)
CO2: 21 mEq/L (ref 19–32)
Calcium: 8.9 mg/dL (ref 8.4–10.5)
Chloride: 95 mEq/L — ABNORMAL LOW (ref 96–112)
Creatinine, Ser: 2.18 mg/dL — ABNORMAL HIGH (ref 0.50–1.35)
GFR calc Af Amer: 34 mL/min — ABNORMAL LOW (ref >90)
GFR calc non Af Amer: 29 mL/min — ABNORMAL LOW (ref >90)
Glucose, Bld: 96 mg/dL (ref 70–99)
Potassium: 4.5 mEq/L (ref 3.5–5.1)
Sodium: 129 mEq/L — ABNORMAL LOW (ref 135–145)

## 2011-07-15 LAB — CBC
HCT: 36.7 % — ABNORMAL LOW (ref 39.0–52.0)
Hemoglobin: 11.7 g/dL — ABNORMAL LOW (ref 13.0–17.0)
MCH: 27.3 pg (ref 26.0–34.0)
MCHC: 31.9 g/dL (ref 30.0–36.0)
MCV: 85.5 fL (ref 78.0–100.0)
Platelets: 174 10*3/uL (ref 150–400)
RBC: 4.29 MIL/uL (ref 4.22–5.81)
RDW: 16 % — ABNORMAL HIGH (ref 11.5–15.5)
WBC: 12 10*3/uL — ABNORMAL HIGH (ref 4.0–10.5)

## 2011-07-15 LAB — GLUCOSE, CAPILLARY
Glucose-Capillary: 122 mg/dL — ABNORMAL HIGH (ref 70–99)
Glucose-Capillary: 105 mg/dL — ABNORMAL HIGH (ref 70–99)

## 2011-07-15 MED ORDER — METOPROLOL TARTRATE 25 MG PO TABS: 12.5000 mg | ORAL_TABLET | Freq: Two times a day (BID) | ORAL | Status: DC

## 2011-07-15 MED ORDER — OXYCODONE HCL 5 MG PO TABS: 5.0000 mg | ORAL_TABLET | ORAL | Status: AC | PRN

## 2011-07-15 MED ORDER — ASPIRIN 325 MG PO TBEC: 325.0000 mg | DELAYED_RELEASE_TABLET | Freq: Every day | ORAL | Status: DC

## 2011-07-15 MED ORDER — SIMVASTATIN 20 MG PO TABS: 20.0000 mg | ORAL_TABLET | Freq: Every day | ORAL | Status: DC

## 2011-07-15 MED ORDER — AMIODARONE HCL 200 MG PO TABS: 200.0000 mg | ORAL_TABLET | Freq: Two times a day (BID) | ORAL | Status: DC

## 2011-07-15 MED ORDER — AMIODARONE HCL 200 MG PO TABS
200.0000 mg | ORAL_TABLET | Freq: Two times a day (BID) | ORAL | Status: DC
  Administered 2011-07-15 – 2011-07-19 (×9): 200 mg via ORAL
  Filled 2011-07-15 (×10): qty 1

## 2011-07-15 NOTE — Progress Notes (Signed)
Pt smoked 1 ppd prior to quitting in January of 2013. Congratulated and encouraged pt to remain tobacco free. Discussed and reviewed relapse prevention strategies and gave pt education/contact information.

## 2011-07-15 NOTE — Progress Notes (Signed)
CARDIAC REHAB PHASE I   PRE:  Rate/Rhythm: 55    BP: sitting 90/50    SaO2: 98 2L, 100 RA on ear  MODE:  Ambulation: 350 ft   POST:  Rate/Rhythm: 66    BP: sitting 100/50     SaO2: 100 at rest during walk on ear (not sure accurate) 88-89 Ra after walk  Pt sts he feels very washed out. Slow to give answers to questions, like it is hard for him. SaO2 seemingly good before walk. Walked without O2. Used RW and assist x2. Pt c/o back pain and fatigue. Did not mention SOB. Sat x1 at half way due to back pain and weakness. SaO2 100 RA on ear, not sure of accuracy. SaO2 on finger on return to room 88-89 RA. Reapplied 2L. Cleaned pt due to leaking stool, sts he can't control it. Has strong odor. Left in recliner. Pt would benefit from PT. Pt very exhausted after walk. He sts main issue is his back pain. [phone]   Harriet Masson CES, ACSM

## 2011-07-15 NOTE — Progress Notes (Addendum)
4 Days Post-Op Procedure(s) (LRB): CORONARY ARTERY BYPASS GRAFTING (CABG) (N/A)  Subjective: Patient states his breathing is better this am. He has had loose stools.  Objective: Vital signs in last 24 hours: Patient Vitals for the past 24 hrs:  BP Temp Temp src Pulse Resp SpO2  07/15/11 0452 94/62 mmHg 97.1 F (36.2 C) Oral 58  17  97 %  07/14/11 2109 111/59 mmHg 97.4 F (36.3 C) Axillary 62  16  95 %  07/14/11 1500 100/53 mmHg - - 63  21  99 %  07/14/11 1435 98/53 mmHg - - 65  17  99 %  07/14/11 1400 99/48 mmHg - - 62  19  98 %  07/14/11 1338 103/46 mmHg - - 63  23  98 %  07/14/11 1300 100/50 mmHg - - 63  23  98 %  07/14/11 1245 - - - 69  27  95 %  07/14/11 1200 112/61 mmHg - - 66  20  99 %  07/14/11 1155 - 97.8 F (36.6 C) Oral - - -  07/14/11 1030 121/77 mmHg - - 69  23  97 %  07/14/11 1000 121/67 mmHg - - 68  21  97 %  07/14/11 0930 129/65 mmHg - - 72  24  94 %  07/14/11 0900 127/49 mmHg - - 72  23  97 %  07/14/11 0830 114/59 mmHg - - 71  26  98 %   Pre op weight  68.9 kg Current Weight  07/14/11 167 lb 5.3 oz (75.9 kg)      Intake/Output from previous day: 03/21 0701 - 03/22 0700 In: 244 [P.O.:240; IV Piggyback:4] Out: 450 [Urine:450]   Physical Exam:  Cardiovascular: RRR, no murmurs, gallops, or rubs. Pulmonary: Decreased at the bases.; no rales, wheezes, or rhonchi. Abdomen: Soft, non tender, bowel sounds present. Extremities: Mild bilateral lower extremity edema. Wounds: RLE dressing clean and dry.Some sero sanguinous ooze from distal portion of sternal wound.  Lab Results: CBC: Basename 07/15/11 0600 07/14/11 0330  WBC 12.0* 13.7*  HGB 11.7* 12.6*  HCT 36.7* 38.6*  PLT 174 141*   BMET:  Basename 07/15/11 0600 07/14/11 0330  NA 129* 129*  K 4.5 4.8  CL 95* 96  CO2 21 21  GLUCOSE 96 92  BUN 57* 39*  CREATININE 2.18* 1.74*  CALCIUM 8.9 9.1    PT/INR: No results found for this basename: LABPROT,INR in the last 72 hours ABG:  INR: Will add  last result for INR, ABG once components are confirmed Will add last 4 CBG results once components are confirmed  Assessment/Plan:  1. CV - Maintaining SR.Decreased Amiodarone to  200 bid,Lopressor 12.5 bid to be restarted in am as HR in the 50's.Will ask Dr. Gala Romney to evaluate (EF 20-25%) 2.  Pulmonary - Encourage incentive spirometer.CXR this am shows trace right pleural effusion and small left pleural effusion, questionable small hydroptx, and atelectasis at the bases. 3. Volume Overload -Will hold diuresis as Creatinine is now increased to 2.18. 4.  Acute blood loss anemia - H/H this am 11.7/36.7. 5.Thrmobocytopenia resolved as platelets now up to 174,000. 6.ARI-creatinine increased from 1.74 to 2.18. Not on ACE or ARB, diuretics held today. Recheck in am. 7.Pre op HGA1C 5.4. CBGs have been less than 100  so will stop SS and glucose checks. 8.Stop stool softeners.  ZIMMERMAN,[name] MPA-C 07/15/2011   increasing Cr with diuretics, overall poor LV function. Heart rate is low. Will need to optimize heart failure meds, will  ask HF team to return  I have seen and examined [name] and agree with the above assessment  and plan.  [name] MD Beeper [phone] Office [phone] 07/15/2011 1:04 PM

## 2011-07-15 NOTE — Progress Notes (Signed)
Patient ID: [name], male   DOB: 04-04-1941, 71 y.o.   MRN: [id number]    @ Subjective:  Denies SSCP, palpitations or Dyspnea No complaints when I examined him this afternoon.    Objective:  Filed Vitals:   07/14/11 2109 07/15/11 0452 07/15/11 1400 07/15/11 2021  BP: 111/59 94/62 122/53 92/55  Pulse: 62 58 86 91  Temp: 97.4 F (36.3 C) 97.1 F (36.2 C) 97.4 F (36.3 C) 97.8 F (36.6 C)  TempSrc: Axillary Oral Oral Oral  Resp: 16 17 18 18   Height:      Weight:      SpO2: 95% 97% 97% 92%    Intake/Output from previous day:  Intake/Output Summary (Last 24 hours) at 07/15/11 2208 Last data filed at 07/15/11 1800  Gross per 24 hour  Intake      0 ml  Output    200 ml  Net   -200 ml    Physical Exam:  Affect appropriate Post  CABG HEENT: normal Neck supple with no adenopathy JVP up no bruits no thyromegaly Lungs atelectasis and decreased BS at base Heart:  S1/S2 no murmur, no rub, gallop or click PMI normal Abdomen: benighn, BS positve, no tenderness, no AAA no bruit.  No HSM or HJR Distal pulses intact with no bruits R SV harvest site with dressing plus one bilateral edema Neuro non-focal Skin warm and dry No muscular weakness   Lab Results: Basic Metabolic Panel:  Basename 07/15/11 0600 07/14/11 0330  NA 129* 129*  K 4.5 4.8  CL 95* 96  CO2 21 21  GLUCOSE 96 92  BUN 57* 39*  CREATININE 2.18* 1.74*  CALCIUM 8.9 9.1  MG -- --  PHOS -- --   Liver Function Tests:  Basename 07/14/11 0330  AST 198*  ALT 195*  ALKPHOS 110  BILITOT 0.7  PROT 6.0  ALBUMIN 3.0*   No results found for this basename: LIPASE:2,AMYLASE:2 in the last 72 hours CBC:  Basename 07/15/11 0600 07/14/11 0330  WBC 12.0* 13.7*  NEUTROABS -- --  HGB 11.7* 12.6*  HCT 36.7* 38.6*  MCV 85.5 85.4  PLT 174 141*    Imaging: Dg Chest 2 View  07/15/2011  *RADIOLOGY REPORT*  Clinical Data: Effusions.  Status post CABG.  CHEST - 2 VIEW  Comparison: 07/14/2011 and 07/11/2011   Findings: There is small bilateral pleural effusions.  The left effusion is slightly loculated laterally.  Mild cardiomegaly.  Evidence of recent CABG.  No pneumothorax. Central sheath has been removed.  Bleb at the right apex.  IMPRESSION: Small bilateral pleural effusions. Slightly improved aeration at the left base.  Original Report Authenticated By: Gwynn Burly, M.D.   Dg Chest Port 1 View  07/14/2011  *RADIOLOGY REPORT*  Clinical Data: Post CABG  PORTABLE CHEST - 1 VIEW  Comparison: 07/13/2011; 07/12/2011  Findings: Grossly unchanged and enlarged cardiac silhouette and mediastinal contours post median sternotomy and CABG.  Stable position of support apparatus.  No pneumothorax.  Grossly unchanged small bilateral effusions, left greater than right.  Peculiar lucency at the left costophrenic angle is unchanged.  Grossly unchanged basilar opacities.  Grossly unchanged bones.  IMPRESSION: 1.  Unchanged appearance of tiny lucency at the left costophrenic angle which may represent either a portion of aerated lung versus a tiny loculated hydropneumothorax post recent chest tube removal. 2.  Grossly unchanged small bilateral effusions and basilar opacities, left greater than right, favored to represent atelectasis.  Original Report Authenticated By: Alfredia Ferguson  V, M.D.    Cardiac Studies:   Telemetry: NSR with PVC;s  Echo: Intraop TEE EF 25% diffuse hypokinesis  Medications:     . acetaminophen  1,000 mg Oral Q6H   Or  . acetaminophen (TYLENOL) oral liquid 160 mg/5 mL  975 mg Per Tube Q6H  . amiodarone  200 mg Oral BID  . aspirin EC  325 mg Oral Daily  . enoxaparin  30 mg Subcutaneous Q24H  . levothyroxine  200 mcg Oral Daily  . metoprolol tartrate  12.5 mg Oral BID  . pantoprazole  40 mg Oral Q1200  . simvastatin  20 mg Oral q1800  . sodium chloride  3 mL Intravenous Q12H  . sodium chloride  3 mL Intravenous Q12H  . DISCONTD: amiodarone  400 mg Oral BID  . DISCONTD: bisacodyl  10 mg  Oral Daily  . DISCONTD: bisacodyl  10 mg Rectal Daily  . DISCONTD: docusate sodium  200 mg Oral Daily  . DISCONTD: furosemide  40 mg Oral Daily  . DISCONTD: insulin aspart  0-24 Units Subcutaneous TID AC & HS  . DISCONTD: potassium chloride  10 mEq Oral Daily       Assessment/Plan:  CHF:  Diuretics and ACE held due to prerenal azotemia.  Patient has no dyspnic complaints.  Consider adding both back in am If hypnatremia worsens or CXR sats worsen consider adding milrinone in am to help mobilize fluid PAF:  Maint NSR continue PO amiodarone.   CAD: Post CABG reevaluate EF in 2 months consideration for AICD.   Thryoid:  Continue synthroid replacement Chol:  On statin  Primary F/U with Dr Lewie Loron and Bensimohn in [place] 07/15/2011, 10:08 PM

## 2011-07-16 LAB — BASIC METABOLIC PANEL
Potassium: 4.2 mEq/L (ref 3.5–5.1)
Sodium: 127 mEq/L — ABNORMAL LOW (ref 135–145)

## 2011-07-16 LAB — GLUCOSE, CAPILLARY
Glucose-Capillary: 133 mg/dL — ABNORMAL HIGH (ref 70–99)
Glucose-Capillary: 101 mg/dL — ABNORMAL HIGH (ref 70–99)

## 2011-07-16 MED ORDER — FUROSEMIDE 10 MG/ML IJ SOLN
40.0000 mg | Freq: Once | INTRAMUSCULAR | Status: AC
  Administered 2011-07-16: 40 mg via INTRAVENOUS
  Filled 2011-07-16: qty 4

## 2011-07-16 NOTE — Progress Notes (Addendum)
                    [address]            [place] [address]          [phone]     5 Days Post-Op Procedure(s) (LRB): CORONARY ARTERY BYPASS GRAFTING (CABG) (N/A)  Subjective: No more loose stools since Colace stopped.  Eating well.  Breathing stable.    Objective: Vital signs in last 24 hours: Patient Vitals for the past 24 hrs:  BP Temp Temp src Pulse Resp SpO2 Weight  07/16/11 0454 114/42 mmHg 97.4 F (36.3 C) Oral 92  18  92 % -  07/16/11 0153 - - - - - - 74.889 kg (165 lb 1.6 oz)  07/15/11 2021 92/55 mmHg 97.8 F (36.6 C) Oral 91  18  92 % -  07/15/11 1400 122/53 mmHg 97.4 F (36.3 C) Oral 86  18  97 % -   Current Weight  07/16/11 74.889 kg (165 lb 1.6 oz)  Pre op weight 68.9 kg    Intake/Output from previous day: 03/22 0701 - 03/23 0700 In: -  Out: 200 [Urine:200]    PHYSICAL EXAM:  Heart: Bradycardic under pacer, 50s-60s Lungs: crackles in bases Wound: Serosanguinous drainage from lower aspect of sternal wound.  No erythema. Extremities: mild LE edema   Lab Results: CBC: Basename 07/15/11 0600 07/14/11 0330  WBC 12.0* 13.7*  HGB 11.7* 12.6*  HCT 36.7* 38.6*  PLT 174 141*   BMET:  Basename 07/16/11 0600 07/15/11 0600  NA 127* 129*  K 4.2 4.5  CL 93* 95*  CO2 23 21  GLUCOSE 80 96  BUN 58* 57*  CREATININE 2.12* 2.18*  CALCIUM 8.7 8.9    PT/INR: No results found for this basename: LABPROT,INR in the last 72 hours   Assessment/Plan: S/P Procedure(s) (LRB): CORONARY ARTERY BYPASS GRAFTING (CABG) (N/A) CV- Currently paced at 90, brady under pacer.  Amio and Lopressor doses decreased yesterday. Will watch for now. CHF- seen by cardiology - ACE-I, diuretics on hold secondary to increased Cr. Hyponatremia- stable, down slightly.  Will follow. ARI- Cr stable.  CHF meds on hold. CRPI, pulm toilet.   LOS: 9 days    COLLINS,GINA H 07/16/2011   I have seen and examined the patient and agree with the assessment and plan as  outlined.  Creatinine seems to have reached a plateau.  Hyponatremia likely due to relative excess free water.  Will give one dose lasix to stimulate UOP although he doesn't look to be too volume overloaded.  Shatina Streets H 07/16/2011 1:59 PM

## 2011-07-16 NOTE — Progress Notes (Signed)
CARDIAC REHAB PHASE I   PRE:  Rate/Rhythm: Paced 90  BP:  Supine:   Sitting: 131/60  Standing:    SaO2: 98 RA  MODE:  Ambulation: 350  ft   POST:  Rate/Rhythem: paced  BP:  Supine:   Sitting: 121/61  Standing:    SaO2: 95 RA Pt ambulated 350 x 2 assist.  Pt at first declined to walk with rehab staff but later called on call bell that he wanted to walk.  Pt ambulates slowly with some complaints of SOB that improved with purse lip breathing.  Pt encouraged to ambulate with nursing staff for additional ambulation.  Les Pou, Froilan Mclean [place]

## 2011-07-16 NOTE — Progress Notes (Addendum)
    Subjective:   Pt is s/p CABG.  Has had renal insufficiency, a-fib,   Still in SR ( a-paced) Slow progress     . acetaminophen  1,000 mg Oral Q6H   Or  . acetaminophen (TYLENOL) oral liquid 160 mg/5 mL  975 mg Per Tube Q6H  . amiodarone  200 mg Oral BID  . aspirin EC  325 mg Oral Daily  . enoxaparin  30 mg Subcutaneous Q24H  . levothyroxine  200 mcg Oral Daily  . metoprolol tartrate  12.5 mg Oral BID  . pantoprazole  40 mg Oral Q1200  . simvastatin  20 mg Oral q1800  . sodium chloride  3 mL Intravenous Q12H  . sodium chloride  3 mL Intravenous Q12H      Objective:  Vital Signs in the last 24 hours: Blood pressure 114/42, pulse 92, temperature 97.4 F (36.3 C), temperature source Oral, resp. rate 18, height 5\' 9"  (1.753 m), weight 165 lb 1.6 oz (74.889 kg), SpO2 92.00%. Temp:  [97.4 F (36.3 C)-97.8 F (36.6 C)] 97.4 F (36.3 C) (03/23 0454) Pulse Rate:  [86-92] 92  (03/23 0454) Resp:  [18] 18  (03/23 0454) BP: (92-122)/(42-55) 114/42 mmHg (03/23 0454) SpO2:  [92 %-97 %] 92 % (03/23 0454) Weight:  [165 lb 1.6 oz (74.889 kg)] 165 lb 1.6 oz (74.889 kg) (03/23 0153)  Intake/Output from previous day: 03/22 0701 - 03/23 0700 In: -  Out: 200 [Urine:200] Intake/Output from this shift: Total I/O In: 120 [P.O.:120] Out: -   Physical Exam:  Physical Exam: Blood pressure 114/42, pulse 92, temperature 97.4 F (36.3 C), temperature source Oral, resp. rate 18, height 5\' 9"  (1.753 m), weight 165 lb 1.6 oz (74.889 kg), SpO2 92.00%. General: chronically ill appearing gentleman Head: Normocephalic, atraumatic, sclera non-icteric, mucus membranes are moist,  Neck: Supple. Normal carotids. No JVD Lungs: mostly clear Heart: Regular rate,  With normal  S1 S2. Soft pericardial rub.  sternotomy is clear and dry Abdomen: Soft, non-tender, non-distended with normoactive bowel sounds. No hepatomegaly. No rebound/guarding. No abdominal masses. Msk:  Frail appearing Extremities: No  clubbing or cyanosis. No edema.  Distal pedal pulses are 2+ and equal bilaterally. Neuro: Alert and oriented X 3. Moves all extremities spontaneously. Psych:  Responds to questions appropriately with a normal affect.    Lab Results:   Basename 07/16/11 0600 07/15/11 0600  NA 127* 129*  K 4.2 4.5  CL 93* 95*  CO2 23 21  GLUCOSE 80 96  BUN 58* 57*  CREATININE 2.12* 2.18*  CALCIUM 8.7 8.9  MG -- --  PHOS -- --    Basename 07/14/11 0330  AST 198*  ALT 195*  ALKPHOS 110  BILITOT 0.7  PROT 6.0  ALBUMIN 3.0*   No results found for this basename: LIPASE:2,AMYLASE:2 in the last 72 hours  Basename 07/15/11 0600 07/14/11 0330  WBC 12.0* 13.7*  NEUTROABS -- --  HGB 11.7* 12.6*  HCT 36.7* 38.6*  MCV 85.5 85.4  PLT 174 141*   Tele: A-paced   Assessment/Plan:   1. CAD: s/p CABG.  Slow progress.  Appears frail.  LFTs are up.  Creatinine is still elevated. 2. A-fib:  On amiodarone.  LFTs are elevated but his is not  Due to the amiodarone therapy - secondary to surgery  / anesthesia.   Disposition: continue current meds  Vesta Mixer, Montez Hageman., MD, [facility] 07/16/2011, 10:38 AM LOS: Day 9

## 2011-07-17 LAB — BASIC METABOLIC PANEL
CO2: 25 mEq/L (ref 19–32)
Chloride: 100 mEq/L (ref 96–112)
GFR calc Af Amer: 49 mL/min — ABNORMAL LOW (ref >90)
Potassium: 4.2 mEq/L (ref 3.5–5.1)
Sodium: 137 mEq/L (ref 135–145)

## 2011-07-17 LAB — GLUCOSE, CAPILLARY
Glucose-Capillary: 90 mg/dL (ref 70–99)
Glucose-Capillary: 80 mg/dL (ref 70–99)
Glucose-Capillary: 93 mg/dL (ref 70–99)

## 2011-07-17 MED ORDER — POTASSIUM CHLORIDE CRYS ER 20 MEQ PO TBCR
20.0000 meq | EXTENDED_RELEASE_TABLET | Freq: Every day | ORAL | Status: DC
  Administered 2011-07-17 – 2011-07-18 (×2): 20 meq via ORAL
  Filled 2011-07-17 (×2): qty 1

## 2011-07-17 MED ORDER — FUROSEMIDE 40 MG PO TABS
40.0000 mg | ORAL_TABLET | Freq: Every day | ORAL | Status: DC
  Administered 2011-07-17 – 2011-07-19 (×3): 40 mg via ORAL
  Filled 2011-07-17 (×4): qty 1

## 2011-07-17 NOTE — Progress Notes (Addendum)
                    [address]            [place] [address]          [phone]     6 Days Post-Op Procedure(s) (LRB): CORONARY ARTERY BYPASS GRAFTING (CABG) (N/A)  Subjective: Gets SOB with activity, otherwise feels ok.  Objective: Vital signs in last 24 hours: Patient Vitals for the past 24 hrs:  BP Temp Temp src Pulse Resp SpO2 Weight  07/17/11 0436 121/72 mmHg 97.4 F (36.3 C) Oral 90  19  93 % 74 kg (163 lb 2.3 oz)  07/16/11 1935 114/74 mmHg 97.4 F (36.3 C) Oral 90  18  100 % -  07/16/11 1538 105/65 mmHg 97 F (36.1 C) Oral 87  18  91 % -   Current Weight  07/17/11 74 kg (163 lb 2.3 oz)  Pre op weight 68.9 kg    Intake/Output from previous day: 03/23 0701 - 03/24 0700 In: 480 [P.O.:480] Out: 750 [Urine:750]    PHYSICAL EXAM:  Heart: RRR, HR 65 under pacer Lungs: few crackles in bases Wound: clean and dry Extremities: mild LE edema   Lab Results: CBC: Basename 07/15/11 0600  WBC 12.0*  HGB 11.7*  HCT 36.7*  PLT 174   BMET:  Basename 07/17/11 0625 07/16/11 0600  NA 137 127*  K 4.2 4.2  CL 100 93*  CO2 25 23  GLUCOSE 81 80  BUN 47* 58*  CREATININE 1.59* 2.12*  CALCIUM 8.9 8.7    PT/INR: No results found for this basename: LABPROT,INR in the last 72 hours   Assessment/Plan: S/P Procedure(s) (LRB): CORONARY ARTERY BYPASS GRAFTING (CABG) (N/A) CV- HR 60s under pacer.  Will leave set on backup at 60 bpm and watch. CHF/vol overload- Will resume low dose diuretic. ARI- Cr significantly improved.   Hyponatremia- resolved. CRPI, pulm toilet.   LOS: 10 days    COLLINS,GINA H 07/17/2011   I have seen and examined the patient and agree with the assessment and plan as outlined.  Mr Noteboom is slowly improving.  I expect that he should be ready for d/c in 2-3 days.  Smitty Ackerley H 07/17/2011 12:01 PM

## 2011-07-17 NOTE — Progress Notes (Signed)
    Subjective:   Pt is s/p CABG.  Has had renal insufficiency, a-fib,   Still in SR ( a-paced) Slow progress     . acetaminophen  1,000 mg Oral Q6H   Or  . acetaminophen (TYLENOL) oral liquid 160 mg/5 mL  975 mg Per Tube Q6H  . amiodarone  200 mg Oral BID  . aspirin EC  325 mg Oral Daily  . enoxaparin  30 mg Subcutaneous Q24H  . furosemide  40 mg Intravenous Once  . levothyroxine  200 mcg Oral Daily  . metoprolol tartrate  12.5 mg Oral BID  . pantoprazole  40 mg Oral Q1200  . simvastatin  20 mg Oral q1800  . sodium chloride  3 mL Intravenous Q12H  . sodium chloride  3 mL Intravenous Q12H      Objective:  Vital Signs in the last 24 hours: Blood pressure 121/72, pulse 90, temperature 97.4 F (36.3 C), temperature source Oral, resp. rate 19, height 5\' 9"  (1.753 m), weight 163 lb 2.3 oz (74 kg), SpO2 93.00%. Temp:  [97 F (36.1 C)-97.4 F (36.3 C)] 97.4 F (36.3 C) (03/24 0436) Pulse Rate:  [87-90] 90  (03/24 0436) Resp:  [18-19] 19  (03/24 0436) BP: (105-121)/(65-74) 121/72 mmHg (03/24 0436) SpO2:  [91 %-100 %] 93 % (03/24 0436) Weight:  [163 lb 2.3 oz (74 kg)] 163 lb 2.3 oz (74 kg) (03/24 0436)  Intake/Output from previous day: 03/23 0701 - 03/24 0700 In: 480 [P.O.:480] Out: 750 [Urine:750] Intake/Output from this shift: Total I/O In: 240 [P.O.:240] Out: 100 [Urine:100]  Physical Exam:  Physical Exam: Blood pressure 121/72, pulse 90, temperature 97.4 F (36.3 C), temperature source Oral, resp. rate 19, height 5\' 9"  (1.753 m), weight 163 lb 2.3 oz (74 kg), SpO2 93.00%. General: chronically ill appearing gentleman Head: Normocephalic, atraumatic, sclera non-icteric, mucus membranes are moist,  Neck: Supple. Normal carotids. No JVD Lungs: mostly clear Heart: Regular rate,  With normal  S1 S2. Soft rub. sternotomy is clear and dry Abdomen: Soft, non-tender, non-distended with normoactive bowel sounds. No hepatomegaly. No rebound/guarding. No abdominal  masses. Msk:  Frail appearing Extremities: No clubbing or cyanosis. No edema.  Distal pedal pulses are 2+ and equal bilaterally. Neuro: Alert and oriented X 3. Moves all extremities spontaneously. Psych:  Responds to questions appropriately with a normal affect.    Lab Results:   Basename 07/17/11 0625 07/16/11 0600  NA 137 127*  K 4.2 4.2  CL 100 93*  CO2 25 23  GLUCOSE 81 80  BUN 47* 58*  CREATININE 1.59* 2.12*  CALCIUM 8.9 8.7  MG -- --  PHOS -- --   No results found for this basename: AST:2,ALT:2,ALKPHOS:2,BILITOT:2,PROT:2,ALBUMIN:2 in the last 72 hours No results found for this basename: LIPASE:2,AMYLASE:2 in the last 72 hours  Basename 07/15/11 0600  WBC 12.0*  NEUTROABS --  HGB 11.7*  HCT 36.7*  MCV 85.5  PLT 174   Tele: A-paced   Assessment/Plan:   1. CAD: s/p CABG.  Slow progress.  Appears frail.  LFTs are up.  Creatinine is still elevated. 2. A-fib:  On amiodarone. Currently atrially paced.   Disposition: continue current meds  Vesta Mixer, Montez Hageman., MD, [facility] 07/17/2011, 9:48 AM LOS: Day 10

## 2011-07-18 LAB — BASIC METABOLIC PANEL
Calcium: 8.9 mg/dL (ref 8.4–10.5)
Creatinine, Ser: 1.33 mg/dL (ref 0.50–1.35)
GFR calc non Af Amer: 53 mL/min — ABNORMAL LOW (ref >90)
Sodium: 133 mEq/L — ABNORMAL LOW (ref 135–145)

## 2011-07-18 MED ORDER — POTASSIUM CHLORIDE CRYS ER 20 MEQ PO TBCR
20.0000 meq | EXTENDED_RELEASE_TABLET | Freq: Once | ORAL | Status: AC
  Administered 2011-07-18: 20 meq via ORAL
  Filled 2011-07-18: qty 1

## 2011-07-18 MED ORDER — FUROSEMIDE 40 MG PO TABS
40.0000 mg | ORAL_TABLET | Freq: Once | ORAL | Status: AC
  Administered 2011-07-18: 40 mg via ORAL
  Filled 2011-07-18: qty 1

## 2011-07-18 MED FILL — Sodium Chloride IV Soln 0.9%: INTRAVENOUS | Qty: 1000 | Status: AC

## 2011-07-18 MED FILL — Sodium Chloride Irrigation Soln 0.9%: Qty: 3000 | Status: AC

## 2011-07-18 MED FILL — Electrolyte-R (PH 7.4) Solution: INTRAVENOUS | Qty: 3000 | Status: AC

## 2011-07-18 MED FILL — Heparin Sodium (Porcine) Inj 1000 Unit/ML: INTRAMUSCULAR | Qty: 10 | Status: AC

## 2011-07-18 MED FILL — Sodium Bicarbonate IV Soln 8.4%: INTRAVENOUS | Qty: 50 | Status: AC

## 2011-07-18 MED FILL — Lidocaine HCl IV Inj 20 MG/ML: INTRAVENOUS | Qty: 5 | Status: AC

## 2011-07-18 MED FILL — Mannitol IV Soln 20%: INTRAVENOUS | Qty: 500 | Status: AC

## 2011-07-18 MED FILL — Heparin Sodium (Porcine) Inj 1000 Unit/ML: INTRAMUSCULAR | Qty: 30 | Status: AC

## 2011-07-18 NOTE — Progress Notes (Addendum)
7 Days Post-Op Procedure(s) (LRB): CORONARY ARTERY BYPASS GRAFTING (CABG) (N/A)  Subjective: Patient with SOB upon exertion and had an episode of rest where he felt as though he was unable to take a deep breath.  Objective: Vital signs in last 24 hours: Patient Vitals for the past 24 hrs:  BP Temp Temp src Pulse Resp SpO2 Weight  07/18/11 0500 140/81 mmHg 97.5 F (36.4 C) Oral 68  18  100 % 163 lb 2.3 oz (74 kg)  07/17/11 2200 117/66 mmHg 97.6 F (36.4 C) Oral 71  18  99 % -  07/17/11 1353 126/54 mmHg 98.7 F (37.1 C) Oral 73  18  99 % -   Pre op weight  68.9 kg Current Weight  07/18/11 163 lb 2.3 oz (74 kg)       Intake/Output from previous day: 03/24 0701 - 03/25 0700 In: 720 [P.O.:720] Out: 800 [Urine:800]   Physical Exam:  Cardiovascular: RRR, no murmurs, gallops, or rubs. Pulmonary: Crackles bilaterally; no rales, wheezes, or rhonchi. Abdomen: Soft, non tender, bowel sounds present. Extremities:  Bilateral lower extremity edema. Wounds: Clean and dry.  No erythema or signs of infection.  Lab Results: CBC:No results found for this basename: WBC:2,HGB:2,HCT:2,PLT:2 in the last 72 hours BMET:  [facility] 07/18/11 0551 07/17/11 0625  NA 133* 137  K 4.2 4.2  CL 99 100  CO2 26 25  GLUCOSE 89 81  BUN 39* 47*  CREATININE 1.33 1.59*  CALCIUM 8.9 8.9    PT/INR: No results found for this basename: LABPROT,INR in the last 72 hours ABG:  INR: Will add last result for INR, ABG once components are confirmed Will add last 4 CBG results once components are confirmed  Assessment/Plan:  1. CV - Previous afib but has been maintaining SR for the last few days.Has also been previously apaced. Continue Amiodarone 200 bid, Lopressor 12. 5 bid. 2.  Pulmonary - Encourage incentive spirometer.Check CXR in am. 3. Volume Overload - Needs more diuresis so will  increase Lasix to bid and monitor creatinine. 4.Renal insufficiency resolved as creatinine down to  1.33.   ZIMMERMAN,DONIELLE MPA-C 07/18/2011  LFT elevated some, will hold STATIN until normalize LFTS  Cr better off lasix, but now increased fluid restart lasix I have seen and examined [name] and agree with the above assessment  and plan.  [name] MD Beeper [phone] Office [phone] 07/18/2011 8:12 AM

## 2011-07-18 NOTE — Progress Notes (Signed)
CARDIAC REHAB PHASE I   PRE:  Rate/Rhythm: 63 SR    BP: sitting 116/66    SaO2: 98 2L, 94 RA  MODE:  Ambulation: 550 ft   POST:  Rate/Rhythm: 73    BP: sitting 132/62     SaO2: 95 RA  Pt much improved. Used RW and assist x2. Stronger. No c/o SOB. To chair after walk on RA. Did not require O2 to walk. Sts he feels much better this morning after feeling extreme SOB last night and needing lasix.  [phone]  Harriet Masson CES, ACSM

## 2011-07-18 NOTE — Progress Notes (Signed)
Patient, up to chair, x 1 assist, patient tolerated well willmonitor patient.  Felton Buczynski, Randall An RN

## 2011-07-18 NOTE — Progress Notes (Signed)
Patient ambulated approximately 650 feet with walker and assist x1 patient with rest break x1 slightly short of breath.  Back in room into chair. sats 95% on RA. Will monitor patient. Krisandra Bueno, Randall An RN

## 2011-07-18 NOTE — Progress Notes (Signed)
Pt ambulated approximately 650 feet x1 assist. Patient back in room will monitor. Bettye Sitton, Randall An RN

## 2011-07-19 ENCOUNTER — Inpatient Hospital Stay (HOSPITAL_COMMUNITY): Payer: Medicare Other

## 2011-07-19 LAB — BASIC METABOLIC PANEL
Calcium: 9.2 mg/dL (ref 8.4–10.5)
GFR calc Af Amer: 55 mL/min — ABNORMAL LOW (ref >90)
GFR calc non Af Amer: 48 mL/min — ABNORMAL LOW (ref >90)
Potassium: 4.8 mEq/L (ref 3.5–5.1)
Sodium: 133 mEq/L — ABNORMAL LOW (ref 135–145)

## 2011-07-19 MED ORDER — AMIODARONE HCL 200 MG PO TABS
200.0000 mg | ORAL_TABLET | Freq: Every day | ORAL | Status: DC
  Administered 2011-07-20 – 2011-07-22 (×3): 200 mg via ORAL
  Filled 2011-07-19 (×3): qty 1

## 2011-07-19 MED ORDER — CARVEDILOL 3.125 MG PO TABS
3.1250 mg | ORAL_TABLET | Freq: Two times a day (BID) | ORAL | Status: DC
  Administered 2011-07-19 – 2011-07-22 (×5): 3.125 mg via ORAL
  Filled 2011-07-19 (×8): qty 1

## 2011-07-19 MED ORDER — LACTULOSE 10 GM/15ML PO SOLN
20.0000 g | Freq: Once | ORAL | Status: AC
  Administered 2011-07-19: 20 g via ORAL
  Filled 2011-07-19: qty 30

## 2011-07-19 NOTE — Progress Notes (Signed)
Pt ambulated in hallway 600 feet x 1 asssit. Pt tolerated well. Back in room call bell in reach. Cadi Rhinehart, Randall An RN

## 2011-07-19 NOTE — Progress Notes (Addendum)
8 Days Post-Op Procedure(s) (LRB): CORONARY ARTERY BYPASS GRAFTING (CABG) (N/A)  Subjective: Patient reports his breathing has improved.  Objective: Vital signs in last 24 hours: Patient Vitals for the past 24 hrs:  BP Temp Temp src Pulse Resp SpO2 Weight  07/19/11 0525 135/81 mmHg 97.5 F (36.4 C) Oral 59  20  97 % 162 lb 11.2 oz (73.8 kg)  07/18/11 2120 - - - 68  - - -  07/18/11 2047 121/65 mmHg 98 F (36.7 C) Oral 68  20  98 % -  07/18/11 1434 - - - - - 95 % -  07/18/11 1328 135/65 mmHg 97.5 F (36.4 C) Oral 74  18  98 % -  07/18/11 0948 134/82 mmHg - - 62  - - -   Pre op weight  68.9 kg Current Weight  07/19/11 162 lb 11.2 oz (73.8 kg)       Intake/Output from previous day: 03/25 0701 - 03/26 0700 In: 486 [P.O.:480; I.V.:6] Out: 950 [Urine:950]   Physical Exam:  Cardiovascular: RRR, no murmurs, gallops, or rubs. Pulmonary: Crackles bilaterally; no rales, wheezes, or rhonchi. Abdomen: Soft, non tender, bowel sounds present. Extremities:  Bilateral lower extremity edema. Wounds: Clean and dry.  No erythema or signs of infection.  Lab Results: CBC:No results found for this basename: WBC:2,HGB:2,HCT:2,PLT:2 in the last 72 hours BMET:   [facility] 07/19/11 0445 07/18/11 0551  NA 133* 133*  K 4.8 4.2  CL 98 99  CO2 27 26  GLUCOSE 95 89  BUN 38* 39*  CREATININE 1.44* 1.33  CALCIUM 9.2 8.9    PT/INR: No results found for this basename: LABPROT,INR in the last 72 hours ABG:  INR: Will add last result for INR, ABG once components are confirmed Will add last 4 CBG results once components are confirmed  Assessment/Plan:  1. CV - Previous afib but has been maintaining SR for the last few days.Has also been previously apaced. HR has been in the low 60's. On  Amiodarone 200 bid, Lopressor 12. 5 bid.Will hold Lopressor this am. I discussed with Dr. Donata Clay, we will stop Lopressor and change to low dose Coreg with parameters.I will also decrease Amiodarone to 200 mg  pod daily. 2.  Pulmonary - Encourage incentive spirometer. CXR this am shows improvement in aeration (small b/l pleural effusions). 3. Volume Overload - Given Lasix bid yesterday but creatinine slightly increased. Will give once today. 4.Renal insufficiency-creatinine slightly increased from 1.3 to 1.4. 5.Per Dr. Tyrone Sage, will hold Zocor until LFTs normalize.Will check LFTs in am.   Ransom Nickson MPA-C 07/19/2011   07/19/2011 7:58 AM

## 2011-07-19 NOTE — Progress Notes (Signed)
CARDIAC REHAB PHASE I   PRE:  Rate/Rhythm: 70SR  BP:  Supine: 124/78  Sitting:   Standing:    SaO2: 100%2L  MODE:  Ambulation: 650 ft   POST:  Rate/Rhythem: 71  BP:  Supine:   Sitting: 130/64  Standing:    SaO2: 96%RA hall and room 1102-1130 Pt walked 650 ft on RA with rolling walker and asst x 1. Tolerated well on RA. Some DOE during walk but sats ok. Pt walked at his pace and he said he felt less SOB. To bed after walk. Left off oxygen.  Duanne Limerick

## 2011-07-20 LAB — COMPREHENSIVE METABOLIC PANEL
AST: 28 U/L (ref 0–37)
Albumin: 2.8 g/dL — ABNORMAL LOW (ref 3.5–5.2)
Alkaline Phosphatase: 96 U/L (ref 39–117)
Chloride: 98 mEq/L (ref 96–112)
Potassium: 4.6 mEq/L (ref 3.5–5.1)
Sodium: 135 mEq/L (ref 135–145)
Total Bilirubin: 0.6 mg/dL (ref 0.3–1.2)
Total Protein: 5.5 g/dL — ABNORMAL LOW (ref 6.0–8.3)

## 2011-07-20 MED ORDER — FUROSEMIDE 40 MG PO TABS
40.0000 mg | ORAL_TABLET | Freq: Two times a day (BID) | ORAL | Status: DC
  Administered 2011-07-20 – 2011-07-22 (×5): 40 mg via ORAL
  Filled 2011-07-20 (×7): qty 1

## 2011-07-20 MED ORDER — LACTULOSE 10 GM/15ML PO SOLN
20.0000 g | Freq: Once | ORAL | Status: DC
  Filled 2011-07-20: qty 30

## 2011-07-20 NOTE — Progress Notes (Signed)
Pt walked 600 ft on RA with front wheel walker and standby assist.  Tolerated well.  Pt was able to hold conversation while walking.  Pt returned to recliner after walk, with wife at bedside.  Call bell in reach.

## 2011-07-20 NOTE — Progress Notes (Signed)
                    [address]            [place] [address]          [phone]     9 Days Post-Op Procedure(s) (LRB): CORONARY ARTERY BYPASS GRAFTING (CABG) (N/A)  Subjective: Still feels SOB, slowly improving.  Objective: Vital signs in last 24 hours: Patient Vitals for the past 24 hrs:  BP Temp Temp src Pulse Resp SpO2 Weight  07/20/11 0625 129/67 mmHg 97.2 F (36.2 C) Oral 62  18  94 % 73.5 kg (162 lb 0.6 oz)  07/19/11 2111 110/60 mmHg 97.4 F (36.3 C) Oral 66  18  92 % -  07/19/11 1735 122/66 mmHg - - 68  - - -  07/19/11 1307 131/68 mmHg 97.8 F (36.6 C) Oral 68  18  98 % -  07/19/11 0958 136/67 mmHg - - 67  - - -   Current Weight  07/20/11 73.5 kg (162 lb 0.6 oz)  Pre op weight 68.9 kg    Intake/Output from previous day: 03/26 0701 - 03/27 0700 In: 846 [P.O.:840; I.V.:6] Out: 500 [Urine:500]    PHYSICAL EXAM:  Heart: Paced at 60 bpm, but SR 65 with pacer turned down Lungs:few basilar crackles bilaterally Wound: clean and dry Extremities:+LE edema  Lab Results: CBC:No results found for this basename: WBC:2,HGB:2,HCT:2,PLT:2 in the last 72 hours BMET:  [facility] 07/20/11 0535 07/19/11 0445  NA 135 133*  K 4.6 4.8  CL 98 98  CO2 30 27  GLUCOSE 86 95  BUN 31* 38*  CREATININE 1.35 1.44*  CALCIUM 8.8 9.2    PT/INR: No results found for this basename: LABPROT,INR in the last 72 hours AST 28 ALT 69 TP 5.5 TBili 0.6  Assessment/Plan: S/P Procedure(s) (LRB): CORONARY ARTERY BYPASS GRAFTING (CABG) (N/A) CV- tachy/brady, presently SR 65.  Will decrease pacer to backup of 50 and monitor.  Lopressor changed to Coreg and Amio decreased yesterday.   Vol overload- continue diuresis. ARI- Cr stable, decreased. LFTs normalizing.  Statin on hold. Continue pulm toilet, CRPI.   LOS: 13 days    Kaison Mcparland H 07/20/2011

## 2011-07-20 NOTE — Progress Notes (Signed)
UR Completed.  Garey Alleva Jane [phone] 07/20/2011  

## 2011-07-20 NOTE — Progress Notes (Signed)
CARDIAC REHAB PHASE I   PRE:  Rate/Rhythm: 64SR  BP:  Supine:   Sitting: 124/66  Standing:    SaO2: 98%RA  MODE:  Ambulation: 890 ft   POST:  Rate/Rhythem: 68  BP:  Supine:   Sitting: 130/62  Standing:    SaO2: 94-96%RA 1028-1100 Pt walked 890 ft on RA with rolling walker and asst x 1. Tolerated increase in distance well. To recliner after walk. Appeared a little SOB but sats good on RA.  [name]

## 2011-07-21 LAB — BASIC METABOLIC PANEL
BUN: 33 mg/dL — ABNORMAL HIGH (ref 6–23)
CO2: 31 mEq/L (ref 19–32)
Calcium: 8.8 mg/dL (ref 8.4–10.5)
GFR calc non Af Amer: 45 mL/min — ABNORMAL LOW (ref >90)
Glucose, Bld: 88 mg/dL (ref 70–99)
Sodium: 137 mEq/L (ref 135–145)

## 2011-07-21 MED ORDER — LISINOPRIL 2.5 MG PO TABS
2.5000 mg | ORAL_TABLET | Freq: Every day | ORAL | Status: DC
  Administered 2011-07-21: 2.5 mg via ORAL
  Filled 2011-07-21 (×2): qty 1

## 2011-07-21 NOTE — Progress Notes (Signed)
CARDIAC REHAB PHASE I   PRE:  Rate/Rhythm: 65 SR  BP:  Supine:   Sitting: 102/60  Standing:    SaO2: 99 2L  MODE:  Ambulation: 1040 ft   POST:  Rate/Rhythem: 68  BP:  Supine:   Sitting: 104/70  Standing:    SaO2: 98 RA 1057-1130 Assisted X 1 and used walker to ambulate. Gait steady with walker. Walked 1040 ft without c/o. VS stable.Back to recliner after walk with call light in reach. Discussed Outpt. CRP with pt, he agrees to referral to [facility].  Beatrix Fetters

## 2011-07-21 NOTE — Progress Notes (Signed)
Pt abulated 500 ft with RW and standby assist.  No complaints.  To recliner after walk with multiple family visitors present.

## 2011-07-21 NOTE — Progress Notes (Addendum)
10 Days Post-Op Procedure(s) (LRB): CORONARY ARTERY BYPASS GRAFTING (CABG) (N/A)  Subjective: Patient is breathing better. Would like to go home.  Objective: Vital signs in last 24 hours: Patient Vitals for the past 24 hrs:  BP Temp Temp src Pulse Resp SpO2 Weight  07/21/11 0442 125/59 mmHg 97 F (36.1 C) Oral 63  18  92 % 160 lb 7.9 oz (72.8 kg)  07/20/11 2006 108/57 mmHg 97 F (36.1 C) Oral 62  18  96 % -  07/20/11 1417 147/64 mmHg 97.5 F (36.4 C) Oral 61  18  100 % -   Pre op weight  68.9 kg Current Weight  07/21/11 160 lb 7.9 oz (72.8 kg)       Intake/Output from previous day: 03/27 0701 - 03/28 0700 In: 723 [P.O.:720; I.V.:3] Out: 1175 [Urine:1175]   Physical Exam:  Cardiovascular: RRR, no murmurs, gallops, or rubs. Pulmonary: Crackles bilaterally; no rales, wheezes, or rhonchi. Abdomen: Soft, non tender, bowel sounds present. Extremities:  Bilateral lower extremity edema. Wounds: Clean and dry.  No erythema or signs of infection.  Lab Results: CBC:No results found for this basename: WBC:2,HGB:2,HCT:2,PLT:2 in the last 72 hours BMET:   [facility] 07/21/11 0605 07/20/11 0535  NA 137 135  K 4.6 4.6  CL 98 98  CO2 31 30  GLUCOSE 88 86  BUN 33* 31*  CREATININE 1.50* 1.35  CALCIUM 8.8 8.8    PT/INR: No results found for this basename: LABPROT,INR in the last 72 hours ABG:  INR: Will add last result for INR, ABG once components are confirmed Will add last 4 CBG results once components are confirmed  Assessment/Plan:  1. CV - Previous afib but has been maintaining SR for the last few days.Has also been previously apaced. He is still on backup pacer at 52.HR has been in the low mid 50's to 60's. On  Amiodarone 200 daily and Coreg 3.125 bid.Not been given Coreg regularly secondary to HR. Will hold this am. Pre op EF 20-25%. Will obtain an echo and ask cardiology to evaluate. 2.  Pulmonary - Encourage incentive spirometer.  3. Volume Overload - Given Lasix 40  bid yesterday. Creatinine slightly increased to 1.5.Check BNP today. 4.LFTs from yesterday show AST within normal limits and ALT slightly elevated at 69. Will restart statin as an outpatient.    Fany Cavanaugh MPA-C 07/21/2011   07/21/2011 7:39 AM

## 2011-07-21 NOTE — Progress Notes (Signed)
   CARE MANAGEMENT NOTE 07/21/2011  Patient:  [name],[name]   Account Number:  [id number]  Date Initiated:  07/12/2011  Documentation initiated by:  [facility]  Subjective/Objective Assessment:   Admitted for Cardiac cath with LM disease.  Has LLL mass.  Has Spouse.     Action/Plan:   PTA, PT INDEPENDENT, LIVES WITH SPOUSE, WHO IS LEGALLY BLIND.   Anticipated DC Date:  07/19/2011   Anticipated DC Plan:  HOME W HOME HEALTH SERVICES      DC Planning Services  CM consult      Choice offered to / List presented to:             Status of service:  In process, will continue to follow Medicare Important Message given?   (If response is "NO", the following Medicare IM given date fields will be blank) Date Medicare IM given:   Date Additional Medicare IM given:    Discharge Disposition:    Per UR Regulation:  Reviewed for med. necessity/level of care/duration of stay  If discussed at Long Length of Stay Meetings, dates discussed:    Comments:  07/21/11 Bradyn Soward,RN,BSN 1550 MET WITH PT TO DISCUSS DC PLANS.  PT INTERESTED IN HHRN FOR DC, AS PREVIOUSLY DISCUSSED WITH WIFE.  [place] [facility]  HH PROVIDER LIST LEFT WITH PT TO REVIEW WITH WIFE.  WILL FOLLOW UP WITH PT TOMORROW TO DETERMINE PT'S CHOICE OF AGENCY. Phone #[phone]  07/13/11 Latysha Thackston,RN,BSN 1100 MET WITH PT AND WIFE TO DISCUSS DC PLANS.  WIFE STATES THEY HAVE 3 ADULT CHILDREN WHO WILL ASSIST AT HOME AS WELL.  SHE MAY BE INTERESTED IN HHRN AT DISCHARGE TO FOLLOW UP AT HOME.  WILL FOLLOW PT FOR HOME NEEDS AS HE PROGRESSES. Phone #[phone]

## 2011-07-21 NOTE — Progress Notes (Signed)
Patient ID: [name], male   DOB: [birth year], 72 y.o.   MRN: [id number]    SUBJECTIVE: Breathing better today.  No orthopnea last night.  Leg edema going down.    Telemetry: Primarily NSR in 50s-60s.  Very rarely a-paces.  No atrial fibrillation.      Marland Kitchen amiodarone  200 mg Oral Daily  . aspirin EC  325 mg Oral Daily  . carvedilol  3.125 mg Oral BID WC  . enoxaparin  30 mg Subcutaneous Q24H  . furosemide  40 mg Oral BID  . lactulose  20 g Oral Once  . levothyroxine  200 mcg Oral Daily  . pantoprazole  40 mg Oral Q1200  . sodium chloride  3 mL Intravenous Q12H  . DISCONTD: furosemide  40 mg Oral Daily      Filed Vitals:   07/20/11 0625 07/20/11 1417 07/20/11 2006 07/21/11 0442  BP: 129/67 147/64 108/57 125/59  Pulse: 62 61 62 63  Temp: 97.2 F (36.2 C) 97.5 F (36.4 C) 97 F (36.1 C) 97 F (36.1 C)  TempSrc: Oral Oral Oral Oral  Resp: 18 18 18 18   Height:      Weight: 162 lb 0.6 oz (73.5 kg)   160 lb 7.9 oz (72.8 kg)  SpO2: 94% 100% 96% 92%    Intake/Output Summary (Last 24 hours) at 07/21/11 0852 Last data filed at 07/21/11 0518  Gross per 24 hour  Intake    723 ml  Output   1175 ml  Net   -452 ml    LABS: Basic Metabolic Panel:  Basename 07/21/11 0605 07/20/11 0535  NA 137 135  K 4.6 4.6  CL 98 98  CO2 31 30  GLUCOSE 88 86  BUN 33* 31*  CREATININE 1.50* 1.35  CALCIUM 8.8 8.8  MG -- --  PHOS -- --   Liver Function Tests:  Basename 07/20/11 0535  AST 28  ALT 69*  ALKPHOS 96  BILITOT 0.6  PROT 5.5*  ALBUMIN 2.8*   No results found for this basename: LIPASE:2,AMYLASE:2 in the last 72 hours  RADIOLOGY: Dg Chest 2 View  07/19/2011  *RADIOLOGY REPORT*  Clinical Data: CABG, shortness of breath.  CHEST - 2 VIEW  Comparison: 07/15/2011  Findings: Prior CABG.  Cardiomegaly.  Improving bibasilar opacities and small bilateral effusions.  No pneumothorax.  IMPRESSION: Improving bibasilar atelectasis and small effusions.  Original Report Authenticated By:  Cyndie Chime, M.D.    PHYSICAL EXAM General: NAD Neck: JVP 8-9 cm, no thyromegaly or thyroid nodule.  Lungs: Mild rhonchi and decreased breath sounds bilaterally CV: Nondisplaced PMI.  Heart regular S1/S2, no S3/S4, no murmur.  1+ edema 1/2 to knee on left, 2+ edema 1/2 to knee on right.  No carotid bruit.  Normal pedal pulses.  Abdomen: Soft, nontender, no hepatosplenomegaly, no distention.  Neurologic: Alert and oriented x 3.  Psych: Normal affect. Extremities: No clubbing or cyanosis.   ASSESSMENT AND PLAN:  71 yo with CAD and ischemic cardiomyopathy now s/p CABG.  1. Rhythm: Patient is remaining in NSR on amiodarone.  HR in the 50s-60s with rare a-pacing.  I turned his backup rate down to 45 bpm this morning.  I suspect that he is going to pace very rarely if at all.  I think that he is ready to have pacing wires out. Can give Coreg 3.125 mg bid as long as HR > 50 (will not have a marked effect on his rate).  2. CHF: Acute on  chronic systolic CHF, ischemic CMP with EF 20-25%.  He still has some volume overload but improved.  I think that gentle diuresis on the current dose of po Lasix is the right approach here.  Will need to follow creatinine carefully.  Would add low dose lisinopril at 2.5 mg daily (BMET in am).  Continue low dose Coreg as above.  Echo will be done today.  3. CAD: Stable s/p CABG. 4. PT/mobilize.   Marca Ancona 07/21/2011 8:58 AM

## 2011-07-22 DIAGNOSIS — I359 Nonrheumatic aortic valve disorder, unspecified: Secondary | ICD-10-CM

## 2011-07-22 LAB — BASIC METABOLIC PANEL
BUN: 28 mg/dL — ABNORMAL HIGH (ref 6–23)
CO2: 31 mEq/L (ref 19–32)
Calcium: 8.8 mg/dL (ref 8.4–10.5)
GFR calc non Af Amer: 45 mL/min — ABNORMAL LOW (ref >90)
Glucose, Bld: 83 mg/dL (ref 70–99)
Potassium: 4.2 mEq/L (ref 3.5–5.1)
Sodium: 138 mEq/L (ref 135–145)

## 2011-07-22 MED ORDER — ATORVASTATIN CALCIUM 20 MG PO TABS
20.0000 mg | ORAL_TABLET | Freq: Every day | ORAL | Status: DC
  Filled 2011-07-22: qty 1

## 2011-07-22 MED ORDER — LISINOPRIL 5 MG PO TABS
5.0000 mg | ORAL_TABLET | Freq: Every day | ORAL | Status: DC
  Administered 2011-07-22: 5 mg via ORAL
  Filled 2011-07-22: qty 1

## 2011-07-22 MED ORDER — ATORVASTATIN CALCIUM 20 MG PO TABS: 20.0000 mg | ORAL_TABLET | Freq: Every day | ORAL | Status: DC

## 2011-07-22 MED ORDER — CARVEDILOL 3.125 MG PO TABS: 3.1250 mg | ORAL_TABLET | Freq: Two times a day (BID) | ORAL | Status: DC

## 2011-07-22 MED ORDER — AMIODARONE HCL 200 MG PO TABS: 200.0000 mg | ORAL_TABLET | Freq: Every day | ORAL | Status: DC

## 2011-07-22 MED ORDER — LISINOPRIL 5 MG PO TABS: 5.0000 mg | ORAL_TABLET | Freq: Every day | ORAL | Status: DC

## 2011-07-22 MED ORDER — FUROSEMIDE 40 MG PO TABS: 40.0000 mg | ORAL_TABLET | Freq: Two times a day (BID) | ORAL | Status: DC

## 2011-07-22 NOTE — Progress Notes (Signed)
  Echocardiogram 2D Echocardiogram has been performed.  [name] L 07/22/2011, 9:12 AM

## 2011-07-22 NOTE — Discharge Instructions (Addendum)
Activity: 1.May walk up steps                2.No lifting more than ten pounds for four weeks.                 3.No driving for four weeks.                4.Stop any activity that causes chest pain, shortness of breath, dizziness, sweating or excessive weakness.                5.Avoid straining.                6.Continue with your breathing exercises daily.  Diet: Low fat, Low salt diet  Wound Care: May shower.  Clean wounds with mild soap and water daily. Contact the office at [phone] if any problems arise.  Coronary Artery Bypass Grafting Care After Refer to this sheet in the next few weeks. These instructions provide you with information on caring for yourself after your procedure. Your caregiver may also give you more specific instructions. Your treatment has been planned according to current medical practices, but problems sometimes occur. Call your caregiver if you have any problems or questions after your procedure.  Recovery from open heart surgery will be different for everyone. Some people feel well after 3 or 4 weeks, while for others it takes longer. After heart surgery, it may be normal to:  Not have an appetite, feel nauseated by the smell of food, or only want to eat a small amount.   Be constipated because of changes in your diet, activity, and medicines. Eat foods high in fiber. Add fresh fruits and vegetables to your diet. Stool softeners may be helpful.   Feel sad or unhappy. You may be frustrated or cranky. You may have good days and bad days. Do not give up. Talk to your caregiver if you do not feel better.   Feel weakness and fatigue. You many need physical therapy or cardiac rehabilitation to get your strength back.   Develop an irregular heartbeat called atrial fibrillation. Symptoms of atrial fibrillation are a fast, irregular heartbeat or feelings of fluttery heartbeats, shortness of breath, low blood pressure, and dizziness. If these symptoms develop, see your  caregiver right away.  MEDICATION  Have a list of all the medicines you will be taking when you leave the hospital. For every medicine, know the following:   Name.   Exact dose.   Time of day to be taken.   How often it should be taken.   Why you are taking it.   Ask which medicines should or should not be taken together. If you take more than one heart medicine, ask if it is okay to take them together. Some heart medicines should not be taken at the same time because they may lower your blood pressure too much.   Narcotic pain medicine can cause constipation. Eat fresh fruits and vegetables. Add fiber to your diet. Stool softener medicine may help relieve constipation.   Keep a copy of your medicines with you at all times.   Do not add or stop taking any medicine until you check with your caregiver.   Medicines can have side effects. Call your caregiver who prescribed the medicine if you:   Start throwing up, have diarrhea, or have stomach pain.   Feel dizzy or lightheaded when you stand up.   Feel your heart is skipping beats or is beating too fast  or too slow.   Develop a rash.   Notice unusual bruising or bleeding.  HOME CARE INSTRUCTIONS  After heart surgery, it is important to learn how to take your pulse. Have your caregiver show you how to take your pulse.   Use your incentive spirometer. Ask your caregiver how long after surgery you need to use it.  Care of your chest incision  Tell your caregiver right away if you notice clicking in your chest (sternum).   Support your chest with a pillow or your arms when you take deep breaths and cough.   Follow your caregiver's instructions about when you can bathe or swim.   Protect your incision from sunlight during the first year to keep the scar from getting dark.   Tell your caregiver if you notice:   Increased tenderness of your incision.   Increased redness or swelling around your incision.   Drainage or pus  from your incision.  Care of your leg incision(s)  Avoid crossing your legs.   Avoid sitting for long periods of time. Change positions every half hour.   Elevate your leg(s) when you are sitting.   Check your leg(s) daily for swelling. Check the incisions for redness or drainage.   Wear your elastic stockings as told by your caregiver. Take them off at bedtime.  Diet  Diet is very important to heart health.   Eat plenty of fresh fruits and vegetables. Meats should be lean cut. Avoid canned, processed, and fried foods.   Talk to a dietician. They can teach you how to make healthy food and drink choices.  Weight  Weigh yourself every day. This is important because it helps to know if you are retaining fluid that may make your heart and lungs work harder.   Use the same scale each time.   Weigh yourself every morning at the same time. You should do this after you go to the bathroom, but before you eat breakfast.   Your weight will be more accurate if you do not wear any clothes.   Record your weight.   Tell your caregiver if you have gained 2 pounds or more overnight.  Activity Stop any activity at once if you have chest pain, shortness of breath, irregular heartbeats, or dizziness. Get help right away if you have any of these symptoms.  Bathing.  Avoid soaking in a bath or hot tub until your incisions are healed.   Rest. You need a balance of rest and activity.   Exercise. Exercise per your caregiver's advice. You may need physical therapy or cardiac rehabilitation to help strengthen your muscles and build your endurance.   Climbing stairs. Unless your caregiver tells you not to climb stairs, go up stairs slowly and rest if you tire. Do not pull yourself up by the handrail.   Driving a car. Follow your caregiver's advice on when you may drive. You may ride as a passenger at any time. When traveling for long periods of time in a car, get out of the car and walk around for a  few minutes every 2 hours.   Lifting. Avoid lifting, pushing, or pulling anything heavier than 10 pounds for 6 weeks after surgery or as told by your caregiver.   Returning to work. Check with your caregiver. People heal at different rates. Most people will be able to go back to work 6 to 12 weeks after surgery.   Sexual activity. You may resume sexual relations as told by your  caregiver.  SEEK MEDICAL CARE IF:  Any of your incisions are red, painful, or have any type of drainage coming from them.   You have an oral temperature above 102 F (38.9 C).   You have ankle or leg swelling.   You have pain in your legs.   You have weight gain of 2 or more pounds a day.   You feel dizzy or lightheaded when you stand up.  SEEK IMMEDIATE MEDICAL CARE IF:  You have angina or chest pain that goes to your jaw or arms. Call your local emergency services right away.   You have shortness of breath at rest or with activity.   You have a fast or irregular heartbeat (arrhythmia).   There is a "clicking" in your sternum when you move.   You have numbness or weakness in your arms or legs.  MAKE SURE YOU:  Understand these instructions.   Will watch your condition.   Will get help right away if you are not doing well or get worse.  Document Released: 10/29/2004 Document Revised: 03/31/2011 Document Reviewed: 06/16/2010 [facility] Patient Information 2012 [place], [place].    Home Health RN to be provided by [facility] Phone # [phone]

## 2011-07-22 NOTE — Progress Notes (Signed)
EPW's and CT sutures dc'd per order and unit protocol.   All tips intact.  Pt tolerated very well.  Steri's applied ,all sites painted with betadine.  VSS 118/46 HR 60.  2L O2 via [place] applied to keep SPO2>90.  HOB at 30 degrees.  Pt verbalized understanding bedrest for one hour.  Wife at bedsite.  MT notified.  Will monitor closely.

## 2011-07-22 NOTE — Progress Notes (Signed)
[id number] Cardiac Rehab Completed discharge education with pt and wife. They voice understanding.

## 2011-07-22 NOTE — Progress Notes (Signed)
External pacer turned off and disconnected from Pt.  EPW's rolled and taped to belly.  Pt walking now with wife and RW.

## 2011-07-22 NOTE — Progress Notes (Signed)
Patient ID: [name], male   DOB: 08/04/1940, 71 y.o.   MRN: [id number]     SUBJECTIVE: Breathing better today.  No orthopnea last night.  Leg edema going down.    Telemetry: NSR in 50s-60s.  No pacing.  No atrial fibrillation.      Marland Kitchen amiodarone  200 mg Oral Daily  . aspirin EC  325 mg Oral Daily  . carvedilol  3.125 mg Oral BID WC  . enoxaparin  30 mg Subcutaneous Q24H  . furosemide  40 mg Oral BID  . lactulose  20 g Oral Once  . levothyroxine  200 mcg Oral Daily  . lisinopril  2.5 mg Oral Daily  . pantoprazole  40 mg Oral Q1200  . sodium chloride  3 mL Intravenous Q12H      Filed Vitals:   07/21/11 0442 07/21/11 1431 07/21/11 1926 07/22/11 0400  BP: 125/59 135/73 113/57 114/60  Pulse: 63 66 66 88  Temp: 97 F (36.1 C) 97 F (36.1 C) 97 F (36.1 C) 96.9 F (36.1 C)  TempSrc: Oral Oral Oral Oral  Resp: 18 18 18 18   Height:      Weight: 160 lb 7.9 oz (72.8 kg)   157 lb 6.5 oz (71.4 kg)  SpO2: 92% 95% 96% 94%    Intake/Output Summary (Last 24 hours) at 07/22/11 0801 Last data filed at 07/22/11 0756  Gross per 24 hour  Intake   1203 ml  Output   1575 ml  Net   -372 ml    LABS: Basic Metabolic Panel:  Basename 07/22/11 0600 07/21/11 0605  NA 138 137  K 4.2 4.6  CL 99 98  CO2 31 31  GLUCOSE 83 88  BUN 28* 33*  CREATININE 1.50* 1.50*  CALCIUM 8.8 8.8  MG -- --  PHOS -- --   Liver Function Tests:  Basename 07/20/11 0535  AST 28  ALT 69*  ALKPHOS 96  BILITOT 0.6  PROT 5.5*  ALBUMIN 2.8*   No results found for this basename: LIPASE:2,AMYLASE:2 in the last 72 hours  RADIOLOGY: Dg Chest 2 View  07/19/2011  *RADIOLOGY REPORT*  Clinical Data: CABG, shortness of breath.  CHEST - 2 VIEW  Comparison: 07/15/2011  Findings: Prior CABG.  Cardiomegaly.  Improving bibasilar opacities and small bilateral effusions.  No pneumothorax.  IMPRESSION: Improving bibasilar atelectasis and small effusions.  Original Report Authenticated By: Cyndie Chime, M.D.     PHYSICAL EXAM General: NAD Neck: JVP 8 cm, no thyromegaly or thyroid nodule.  Lungs: Mild rhonchi and decreased breath sounds bilaterally CV: Nondisplaced PMI.  Heart regular S1/S2, no S3/S4, no murmur.  1+ edema 1/2 to knee on left, 2+ edema 1/2 to knee on right.  No carotid bruit.  Normal pedal pulses.  Abdomen: Soft, nontender, no hepatosplenomegaly, no distention.  Neurologic: Alert and oriented x 3.  Psych: Normal affect. Extremities: No clubbing or cyanosis.   ASSESSMENT AND PLAN:  71 yo with CAD and ischemic cardiomyopathy now s/p CABG.  1. Rhythm: Patient is remaining in NSR on amiodarone.  HR in the 50s-60s with no a-pacing after decreasing back-up rate to 45.  I think that he is ready to have pacing wires out. Can give Coreg 3.125 mg bid as long as HR > 50 (will not have a marked effect on his rate).  2. CHF: Acute on chronic systolic CHF, ischemic CMP with EF 20-25%.  He still has some volume overload but improved.  I think that gentle diuresis  on the current dose of po Lasix is the right approach here.  Will need to follow creatinine carefully (staying stable).  Increase lisinopril to 5 mg daily.  Continue low dose Coreg as above.  Echo will be done today.  3. CAD: Stable s/p CABG.  LFTs trending down and now nearly normal.  Elevation likely due to hepatic congestion from CHF rather than amiodarone or statin.  Would start back on atorvastatin 20 mg daily.  4. PT/mobilize.   Marca Ancona 07/22/2011 8:01 AM

## 2011-07-22 NOTE — Discharge Summary (Signed)
Physician Discharge Summary  Patient ID: [name] MRN: [id number] DOB/AGE: [birth year] 71 y.o.  Admit date: 07/07/2011 Discharge date: 07/22/2011  Admission Diagnoses: 1. Multi-vessel coronary artery disease (left ventricular ejection fraction 20-25%) 2. History of hypercholesterolemia 3.History  of COPD 4. History of tobacco abuse 5. History of hypothyroidism 6. History of peripheral arterial disease  Discharge Diagnoses:  1. Multi-vessel coronary artery disease (left ventricular ejection fraction 20-25%) 2. History of hypercholesterolemia 3.History  of COPD 4. History of tobacco abuse 5. History of hypothyroidism 6. History of peripheral arterial disease 7.Mild renal insufficiency  8.Mild ABL anemia  Procedure (s):  1. Cardiac catheterization done by Dr. Mariah Milling on 07/07/2011 Coronary angiography:  Coronary dominance: Right  Left mainstem: Large vessel with severe proximal and distal disease estimated at 90%. Disease extends into the proximal LAD.  Left anterior descending (LAD): Large vessel with severe ostial and proximal disease estimated at 90%.  Severe proximal diagonal disease #2,  Left circumflex (LCx): Severe ostial to proximal LCX disease estimated at 95%. OM1/ramus has a high take-off with moderate to severe ostial disease.  Right coronary artery (RCA): Moderate to large vessel with PDA/PL. Moderate to severe ostial and proximal RCA disease estimated at 70%. There is severe mid RCA disease estimated at 95%.  Left ventriculography: Left ventricular systolic function is severely depressed, LVEF is estimated at 20 to 25%, there is mild mitral regurgitation, no significant aortic valve stenosis.  Final Conclusions: Severe proximal and distal left main disease. Severe ostial LAD disease and proximal LCX disease. Severe mid RCA disease. LV gram showing global LV hypokinesis, severely depressed EF, estimated at 20 to 25%. Elevated wedge pressures of 22 (mean 19). Moderate  ostial/proximal left subclavian disease, at least moderate mid left subclavian disease.   2.Coronary artery bypass grafting x3 with free left  internal mammary artery graft to the left anterior descending, reverse  saphenous vein graft to the obtuse marginal, reverse saphenous vein  graft to the posterior descending with right lower leg open endo vein  harvesting, and incidental mediastinal lymph node biopsy by Dr. Tyrone Sage on 07/11/2011.   History of Presenting Illness: This is a pleasant 71 year old Caucasian gentleman with a history of hyperlipidemia, long smoking history for 60 years, PAD (stent to his left lower extremity for claudication;followed by Dr. Evette Cristal),  Who presented for routine follow up. Recent carotid US showed  40% bilateral internal carotid artery disease and subclavian disease on the left.      According to medical records, on his last clinic visit, he was noted to have abnormal EKG compared to prior study. Further workup was ordered and he did not want anything done at that time. He had a previous episode of dizziness several months ago that was felt secondary to a noncardiac issue, possibly inner ear.        He reports that over the past several weeks, he has had worsening shortness of breath.   He can not lie flat and has to sleep sitting up. He has been treated for pneumonia with antibiotics x2. Recent chest x-ray concerning for mass in the left lower lobe and he was scheduled for CT scan of the chest on 07/08/2011. He has also had  pitting edema that has developed over the past several weeks, with 10 pound weight gain. He was started on Lasix  with mild diuresis. He does not see much improvement with inhalers. He denied any dizziness. EKG shows normal sinus rhythm with a rate 86 beats per minute with ST  and T wave abnormality in leads V4 through V6, Leads 2, 3, aVF .      He presented to [facility]07[facility]2011  In order to undergo  a cardiac catheterization by Dr.  Mariah Milling 07/07/2011. He was found to have severe three-vessel coronary artery disease and a left ventricular ejection fraction 20-25%. He then underwent a carotid duplex carotid ultrasound which showed  40-59% bilateral internal carotid artery stenoses. A cardiothoracic consultation was obtained with Dr. Tyrone Sage for the consideration of coronary artery bypass grafting surgery. Potential risks, complications, and benefits of the surgery were discussed with the patient and he agreed to proceed. He underwent a CABG x3 on 07/11/2011. It should also be noted  that a pulmonary consult and clearance was obtained as the patient had a long history of COPD.   Brief Hospital Course:  He was extubated successfully the evening of surgery. He remained afebrile and hemodynamic is stable. Swan-Ganz, A-line, chest tubes, and Foley were all removed early in his postoperative course. He initially required AAI pacing. Weaned off dopamine Neo-Synephrine and milrinone. Lopressor was on hold secondary to hypotension. He then went into atrial fibrillation. He was given an Amiodarone bolus and then placed on a gttp. He then converted  to sinus rhythm. He was placed on oral Amiodarone. He was volume overloaded and diuresed accordingly. He was felt surgically stable for transfer from the ICU to [facility]U for further convalescence on 07/14/2011. He did have  renal insufficiency. His creatinine went as high as 2.18.As a result, his Lasix was held for a couple of days.He then developed loose stools. His stool softeners were stopped and he no longer had diarrhea. He was still occasionally a paced and his heart rate remained in the 50-60's. Lopressor was stopped and low dose Coreg was started. Per cardiology, Coreg was to be given as long as his heart rate remained 50 or greater. He was then started on low dose Lisinopril. He was requiring a couple of liters of oxygen via [place]. He was eventually able to be weaned to room air. His creatinine continued to  improve (remained at 1.5) and he was diuresed.He continued to make steady progress with cardiac rehab.He has already been tolerating a diet and has had multiple bowel movements. BNP today was 8469. Per cardiology, will continue Lasix 40 mg po bid upon discharge. He is to have a BMET drawn on Tuesday 07/26/2011 to check his creatinine. An echocardiogram was ordered yesterday and done this morning. Results are pending. Epicardial pacing wires and chest tube sutures have been removed. Pending surgeon evaluation, patient is surgically stable for discharge today.  Filed Vitals:   07/22/11 0400  BP: 114/60  Pulse: 88  Temp: 96.9 F (36.1 C)  Resp: 18     Latest Vital Signs: Blood pressure 114/60, pulse 88, temperature 96.9 F (36.1 C), temperature source Oral, resp. rate 18, height 5\' 9"  (1.753 m), weight 157 lb 6.5 oz (71.4 kg), SpO2 94.00%.  Physical Exam: Cardiovascular: RRR, no murmurs, gallops, or rubs.  Pulmonary: Some crackles bilaterally; no rales, wheezes, or rhonchi.  Abdomen: Soft, non tender, bowel sounds present.  Extremities: Bilateral lower extremity edema, but is decreasing.  Wounds: Clean and dry. Slight erythema right lower extremity .   Discharge Condition:Stable  Recent laboratory studies:  Lab Results  Component Value Date   WBC 12.0* 07/15/2011   HGB 11.7* 07/15/2011   HCT 36.7* 07/15/2011   MCV 85.5 07/15/2011   PLT 174 07/15/2011   Lab  Results  Component Value Date   NA 138 07/22/2011   K 4.2 07/22/2011   CL 99 07/22/2011   CO2 31 07/22/2011   CREATININE 1.50* 07/22/2011   GLUCOSE 83 07/22/2011      Diagnostic Studies: Dg Chest 2 View  07/19/2011  *RADIOLOGY REPORT*  Clinical Data: CABG, shortness of breath.  CHEST - 2 VIEW  Comparison: 07/15/2011  Findings: Prior CABG.  Cardiomegaly.  Improving bibasilar opacities and small bilateral effusions.  No pneumothorax.  IMPRESSION: Improving bibasilar atelectasis and small effusions.  Original Report Authenticated By:  Cyndie Chime, M.D.     Discharge Orders    Future Appointments: Provider: Department: Dept Phone: Center:   08/08/2011 2:30 PM [phone] [id number]      Discharge Medications: Medication List  As of 07/22/2011  9:59 AM   STOP taking these medications         aspirin 81 MG tablet         TAKE these medications         amiodarone 200 MG tablet   Commonly known as: PACERONE   Take 1 tablet (200 mg total) by mouth daily.      aspirin 325 MG EC tablet   Take 1 tablet (325 mg total) by mouth daily.      atorvastatin 20 MG tablet   Commonly known as: LIPITOR   Take 1 tablet (20 mg total) by mouth daily at 6 PM.      carvedilol 3.125 MG tablet   Commonly known as: COREG   Take 1 tablet (3.125 mg total) by mouth 2 (two) times daily with a meal.      furosemide 40 MG tablet   Commonly known as: LASIX   Take 1 tablet (40 mg total) by mouth 2 (two) times daily.      levothyroxine 25 MCG tablet   Commonly known as: SYNTHROID, LEVOTHROID   Take 25 mcg by mouth every other day.      levothyroxine 200 MCG tablet   Commonly known as: SYNTHROID, LEVOTHROID   Take 200 mcg by mouth daily. Take in addition to 25 mcg daily      lisinopril 5 MG tablet   Commonly known as: PRINIVIL,ZESTRIL   Take 1 tablet (5 mg total) by mouth daily.      oxyCODONE 5 MG immediate release tablet   Commonly known as: Oxy IR/ROXICODONE   Take 1-2 tablets (5-10 mg total) by mouth every 4 (four) hours as needed for pain.      potassium chloride 10 MEQ tablet   Commonly known as: K-DUR   Take 1 tablet (10 mEq total) by mouth 2 (two) times daily.            Follow Up Appointments: Follow-up Information    Follow up with Julien Nordmann, MD. (Call for a follow up for 2 weeks)    Contact information:   [address] [place] [address] [phone]       Follow up with Sheliah Plane B, MD. (PA/LAT CXR to be taken on 08/08/2011 at 1:30  pm;Appointment with Dr. Dennie Maizes PA is on 08/08/2011 at 2:30 pm)    Contact information:   [address] [place] [address] [phone]       Please follow up. (Have BMET drawn on Tuesday 07/26/2011 and results faxed to Dr. Shirlee Latch and Dr. Dennie Maizes office)          Signed:  Ryn Peine MPA-C 07/22/2011, 9:59 AM

## 2011-07-22 NOTE — Progress Notes (Addendum)
11 Days Post-Op Procedure(s) (LRB): CORONARY ARTERY BYPASS GRAFTING (CABG) (N/A)  Subjective: Patient is feeling very well. He is able to lie flat and is no longer requiring oxygen.  Objective: Vital signs in last 24 hours: Patient Vitals for the past 24 hrs:  BP Temp Temp src Pulse Resp SpO2 Weight  07/22/11 0400 114/60 mmHg 96.9 F (36.1 C) Oral 88  18  94 % 157 lb 6.5 oz (71.4 kg)  07/21/11 1926 113/57 mmHg 97 F (36.1 C) Oral 66  18  96 % -  07/21/11 1431 135/73 mmHg 97 F (36.1 C) Oral 66  18  95 % -   Pre op weight  68.9 kg Current Weight  07/22/11 157 lb 6.5 oz (71.4 kg)       Intake/Output from previous day: 03/28 0701 - 03/29 0700 In: 1203 [P.O.:1200; I.V.:3] Out: 1250 [Urine:1250]   Physical Exam:  Cardiovascular: RRR, no murmurs, gallops, or rubs. Pulmonary: Some crackles bilaterally; no rales, wheezes, or rhonchi. Abdomen: Soft, non tender, bowel sounds present. Extremities:  Bilateral lower extremity edema, but is decreasing. Wounds: Clean and dry.  Slight erythema right lower extremity .  Lab Results: CBC:No results found for this basename: WBC:2,HGB:2,HCT:2,PLT:2 in the last 72 hours BMET:   Basename 07/22/11 0600 07/21/11 0605  NA 138 137  K 4.2 4.6  CL 99 98  CO2 31 31  GLUCOSE 83 88  BUN 28* 33*  CREATININE 1.50* 1.50*  CALCIUM 8.8 8.8    PT/INR: No results found for this basename: LABPROT,INR in the last 72 hours ABG:  INR: Will add last result for INR, ABG once components are confirmed Will add last 4 CBG results once components are confirmed  Assessment/Plan:  1. CV - Previous afib but has been maintaining SR for the last few days.Has also been previously a paced. He is still on backup pacer at 45.HR has been in the low mid 50's to 60's. On  Amiodarone 200 daily and Coreg 3.125 bid. Echocardiogram ordered yesterday but not done. 2.  Pulmonary - Encourage incentive spirometer.  3. Volume Overload - Continue Lasix 40 bid . Creatinine  stable at 1.5.BNP 8469. 4.LFTs from yesterday show AST within normal limits and ALT slightly elevated at 69. Will restart statin as an outpatient. 5.Remove EPW. 6.Will discuss discharge disposition with surgeon.    Arial Galligan MPA-C 07/22/2011   07/22/2011 7:43 AM

## 2011-07-22 NOTE — Progress Notes (Signed)
   CARE MANAGEMENT NOTE 07/22/2011  Patient:  [name],[name]   Account Number:  [id number]  Date Initiated:  07/12/2011  Documentation initiated by:  [facility]  Subjective/Objective Assessment:   Admitted for Cardiac cath with LM disease.  Has LLL mass.  Has Spouse.     Action/Plan:   PTA, PT INDEPENDENT, LIVES WITH SPOUSE, WHO IS LEGALLY BLIND.   Anticipated DC Date:  07/19/2011   Anticipated DC Plan:  HOME W HOME HEALTH SERVICES      DC Planning Services  CM consult      [facility] Choice  HOME HEALTH   Choice offered to / List presented to:  C-1 Patient        HH arranged  HH-1 RN      [facility] agency  [facility]   Status of service:  Completed, signed off Medicare Important Message given?   (If response is "NO", the following Medicare IM given date fields will be blank) Date Medicare IM given:   Date Additional Medicare IM given:    Discharge Disposition:  HOME W HOME HEALTH SERVICES  Per UR Regulation:  Reviewed for med. necessity/level of care/duration of stay  If discussed at Long Length of Stay Meetings, dates discussed:    Comments:  07/22/11 Khale Nigh,RN,BSN 1100 MET WITH PT AND WIFE TO DISCUSS HH CHOICE:  FIRST CHOICE OF [facility] UNABLE TO STAFF CASE;  SECOND CHOICE-[facility] ABLE TO STAFF.  REFERRAL TO [facility], START OF CARE 24-48H POST DC DATE. ARRANGED FOR HHRN TO DRAW LAB ON 4/2 PER DC SUMMARY ORDER Phone #[phone] .  07/21/11 Stony Stegmann,RN,BSN 1550 MET WITH PT TO DISCUSS DC PLANS.  PT INTERESTED IN HHRN FOR DC, AS PREVIOUSLY DISCUSSED WITH WIFE.  [place]  HH PROVIDER LIST LEFT WITH PT TO REVIEW WITH WIFE.  WILL FOLLOW UP WITH PT TOMORROW TO DETERMINE PT'S CHOICE OF AGENCY. Phone #[phone]   07/13/11 Milo Schreier,RN,BSN 1100 MET WITH PT AND WIFE TO DISCUSS DC PLANS.  WIFE STATES THEY HAVE 3 ADULT CHILDREN WHO WILL ASSIST AT HOME AS WELL.  SHE MAY BE INTERESTED IN HHRN AT DISCHARGE TO FOLLOW UP AT HOME.  WILL FOLLOW PT FOR HOME NEEDS AS HE  PROGRESSES. Phone #[phone]

## 2011-07-24 DIAGNOSIS — I679 Cerebrovascular disease, unspecified: Secondary | ICD-10-CM | POA: Diagnosis not present

## 2011-07-24 DIAGNOSIS — Z951 Presence of aortocoronary bypass graft: Secondary | ICD-10-CM | POA: Diagnosis not present

## 2011-07-24 DIAGNOSIS — Z48812 Encounter for surgical aftercare following surgery on the circulatory system: Secondary | ICD-10-CM | POA: Diagnosis not present

## 2011-07-24 DIAGNOSIS — J449 Chronic obstructive pulmonary disease, unspecified: Secondary | ICD-10-CM | POA: Diagnosis not present

## 2011-07-24 DIAGNOSIS — I2510 Atherosclerotic heart disease of native coronary artery without angina pectoris: Secondary | ICD-10-CM | POA: Diagnosis not present

## 2011-07-24 DIAGNOSIS — I739 Peripheral vascular disease, unspecified: Secondary | ICD-10-CM | POA: Diagnosis not present

## 2011-07-26 DIAGNOSIS — Z48812 Encounter for surgical aftercare following surgery on the circulatory system: Secondary | ICD-10-CM | POA: Diagnosis not present

## 2011-07-26 DIAGNOSIS — I2510 Atherosclerotic heart disease of native coronary artery without angina pectoris: Secondary | ICD-10-CM | POA: Diagnosis not present

## 2011-07-26 DIAGNOSIS — I739 Peripheral vascular disease, unspecified: Secondary | ICD-10-CM | POA: Diagnosis not present

## 2011-07-26 DIAGNOSIS — Z79899 Other long term (current) drug therapy: Secondary | ICD-10-CM | POA: Diagnosis not present

## 2011-07-26 DIAGNOSIS — I679 Cerebrovascular disease, unspecified: Secondary | ICD-10-CM | POA: Diagnosis not present

## 2011-07-26 DIAGNOSIS — J449 Chronic obstructive pulmonary disease, unspecified: Secondary | ICD-10-CM | POA: Diagnosis not present

## 2011-07-26 DIAGNOSIS — Z951 Presence of aortocoronary bypass graft: Secondary | ICD-10-CM | POA: Diagnosis not present

## 2011-07-28 DIAGNOSIS — Z951 Presence of aortocoronary bypass graft: Secondary | ICD-10-CM | POA: Diagnosis not present

## 2011-07-28 DIAGNOSIS — I2510 Atherosclerotic heart disease of native coronary artery without angina pectoris: Secondary | ICD-10-CM | POA: Diagnosis not present

## 2011-07-28 DIAGNOSIS — Z48812 Encounter for surgical aftercare following surgery on the circulatory system: Secondary | ICD-10-CM | POA: Diagnosis not present

## 2011-07-28 DIAGNOSIS — I679 Cerebrovascular disease, unspecified: Secondary | ICD-10-CM | POA: Diagnosis not present

## 2011-07-28 DIAGNOSIS — J449 Chronic obstructive pulmonary disease, unspecified: Secondary | ICD-10-CM | POA: Diagnosis not present

## 2011-07-28 DIAGNOSIS — I739 Peripheral vascular disease, unspecified: Secondary | ICD-10-CM | POA: Diagnosis not present

## 2011-08-02 DIAGNOSIS — I2510 Atherosclerotic heart disease of native coronary artery without angina pectoris: Secondary | ICD-10-CM | POA: Diagnosis not present

## 2011-08-02 DIAGNOSIS — Z48812 Encounter for surgical aftercare following surgery on the circulatory system: Secondary | ICD-10-CM | POA: Diagnosis not present

## 2011-08-02 DIAGNOSIS — Z951 Presence of aortocoronary bypass graft: Secondary | ICD-10-CM | POA: Diagnosis not present

## 2011-08-02 DIAGNOSIS — I739 Peripheral vascular disease, unspecified: Secondary | ICD-10-CM | POA: Diagnosis not present

## 2011-08-02 DIAGNOSIS — I679 Cerebrovascular disease, unspecified: Secondary | ICD-10-CM | POA: Diagnosis not present

## 2011-08-02 DIAGNOSIS — J449 Chronic obstructive pulmonary disease, unspecified: Secondary | ICD-10-CM | POA: Diagnosis not present

## 2011-08-04 ENCOUNTER — Encounter: Payer: Self-pay | Admitting: *Deleted

## 2011-08-04 ENCOUNTER — Other Ambulatory Visit: Payer: Self-pay | Admitting: Cardiothoracic Surgery

## 2011-08-04 DIAGNOSIS — I2510 Atherosclerotic heart disease of native coronary artery without angina pectoris: Secondary | ICD-10-CM

## 2011-08-05 ENCOUNTER — Telehealth: Payer: Self-pay | Admitting: *Deleted

## 2011-08-05 ENCOUNTER — Telehealth: Payer: Self-pay | Admitting: Cardiovascular Disease

## 2011-08-05 DIAGNOSIS — I679 Cerebrovascular disease, unspecified: Secondary | ICD-10-CM | POA: Diagnosis not present

## 2011-08-05 DIAGNOSIS — Z48812 Encounter for surgical aftercare following surgery on the circulatory system: Secondary | ICD-10-CM | POA: Diagnosis not present

## 2011-08-05 DIAGNOSIS — I2510 Atherosclerotic heart disease of native coronary artery without angina pectoris: Secondary | ICD-10-CM | POA: Diagnosis not present

## 2011-08-05 DIAGNOSIS — Z951 Presence of aortocoronary bypass graft: Secondary | ICD-10-CM | POA: Diagnosis not present

## 2011-08-05 DIAGNOSIS — I739 Peripheral vascular disease, unspecified: Secondary | ICD-10-CM | POA: Diagnosis not present

## 2011-08-05 DIAGNOSIS — J449 Chronic obstructive pulmonary disease, unspecified: Secondary | ICD-10-CM | POA: Diagnosis not present

## 2011-08-05 NOTE — Telephone Encounter (Signed)
Pt's physical therapist calling to let us know low BP this am and she felt cardiologist should be made aware--i phoned wife to get better picture and wife stated pt is weak--BP readings in the high 80's/40--advised wife to have husband drink lots of fluids, get up and move around, hold diuretic in PM for next 3 days, but take AM dose---they have appoint with gollan on Monday--i also called [place] office and asked jamie to have dr Mariah Milling call wife as she seemed upset that pt would not be seen today--wife agrees--nt

## 2011-08-05 NOTE — Telephone Encounter (Signed)
Home health agency calling due to pt BP running low. 86/48 sitting 84/46 standing having dizziness with standing. Tired

## 2011-08-08 ENCOUNTER — Ambulatory Visit (INDEPENDENT_AMBULATORY_CARE_PROVIDER_SITE_OTHER): Payer: Medicare Other | Admitting: Cardiovascular Disease

## 2011-08-08 ENCOUNTER — Encounter: Payer: Self-pay | Admitting: Cardiovascular Disease

## 2011-08-08 ENCOUNTER — Ambulatory Visit
Admission: RE | Admit: 2011-08-08 | Discharge: 2011-08-08 | Disposition: A | Discharge status: Home / Self Care | Payer: Medicare Other | Source: Ambulatory Visit | Attending: Cardiothoracic Surgery | Admitting: Cardiothoracic Surgery

## 2011-08-08 ENCOUNTER — Encounter: Payer: Self-pay | Admitting: Physician Assistant

## 2011-08-08 ENCOUNTER — Ambulatory Visit (INDEPENDENT_AMBULATORY_CARE_PROVIDER_SITE_OTHER): Payer: Self-pay | Admitting: Physician Assistant

## 2011-08-08 VITALS — BP 106/65 | HR 64 | Ht 69.0 in | Wt 148.0 lb

## 2011-08-08 VITALS — BP 113/60 | HR 60 | Resp 18 | Ht 69.0 in | Wt 149.0 lb

## 2011-08-08 DIAGNOSIS — I2510 Atherosclerotic heart disease of native coronary artery without angina pectoris: Secondary | ICD-10-CM

## 2011-08-08 DIAGNOSIS — I739 Peripheral vascular disease, unspecified: Secondary | ICD-10-CM

## 2011-08-08 DIAGNOSIS — I714 Abdominal aortic aneurysm, without rupture, unspecified: Secondary | ICD-10-CM

## 2011-08-08 DIAGNOSIS — R05 Cough: Secondary | ICD-10-CM | POA: Diagnosis not present

## 2011-08-08 DIAGNOSIS — I2589 Other forms of chronic ischemic heart disease: Secondary | ICD-10-CM

## 2011-08-08 DIAGNOSIS — R0989 Other specified symptoms and signs involving the circulatory and respiratory systems: Secondary | ICD-10-CM

## 2011-08-08 DIAGNOSIS — J4489 Other specified chronic obstructive pulmonary disease: Secondary | ICD-10-CM

## 2011-08-08 DIAGNOSIS — R0602 Shortness of breath: Secondary | ICD-10-CM

## 2011-08-08 DIAGNOSIS — J449 Chronic obstructive pulmonary disease, unspecified: Secondary | ICD-10-CM

## 2011-08-08 DIAGNOSIS — I10 Essential (primary) hypertension: Secondary | ICD-10-CM

## 2011-08-08 DIAGNOSIS — R9431 Abnormal electrocardiogram [ECG] [EKG]: Secondary | ICD-10-CM | POA: Diagnosis not present

## 2011-08-08 DIAGNOSIS — Z951 Presence of aortocoronary bypass graft: Secondary | ICD-10-CM

## 2011-08-08 DIAGNOSIS — I255 Ischemic cardiomyopathy: Secondary | ICD-10-CM

## 2011-08-08 DIAGNOSIS — I517 Cardiomegaly: Secondary | ICD-10-CM | POA: Diagnosis not present

## 2011-08-08 DIAGNOSIS — I771 Stricture of artery: Secondary | ICD-10-CM

## 2011-08-08 DIAGNOSIS — R059 Cough, unspecified: Secondary | ICD-10-CM | POA: Diagnosis not present

## 2011-08-08 NOTE — Patient Instructions (Addendum)
You are doing well. Take lasix three times a week Hold lasix for weight less than 145 lb at home (clinic weight of 150 lb)  Take extra lasix for any edema, shortness of breath or rapid weight gain of three pounds or more.  Please call us if you have new issues that need to be addressed before your next appt.  Your physician wants you to follow-up in: 3 months.  You will receive a reminder letter in the mail two months in advance. If you don't receive a letter, please call our office to schedule the follow-up appointment.

## 2011-08-08 NOTE — Progress Notes (Signed)
HPI: Patient returns for routine postoperative follow-up having undergone CABG x3  Free LIMA to LAD, SVG to OM, SVG to PDA, Open Vein Harvest RLE on [id number]. The patient's early postoperative recovery while in the hospital was notable for Atrial Fibrillation and Renal Insufficiency.  Since hospital discharge the patient reports he is doing very well.  He denies chest pain and shortness of breath.  He states he is able to ambulate multiple times per day without any significant fatigue.  He states he is having difficulty eating because he likes to salt his food, which he is trying to limit due to CHF he experienced prior to surgery.  The patient requested pathology report information from a lymph node biopsy performed during his bypass procedure. Finally the patient is concerned that he has lost 20 pounds since surgery.   Current Outpatient Prescriptions  Medication Sig Dispense Refill  . amiodarone (PACERONE) 200 MG tablet Take 1 tablet (200 mg total) by mouth daily.  30 tablet  1  . aspirin EC 325 MG EC tablet Take 1 tablet (325 mg total) by mouth daily.  30 tablet    . atorvastatin (LIPITOR) 20 MG tablet Take 1 tablet (20 mg total) by mouth daily at 6 PM.  30 tablet  1  . carvedilol (COREG) 3.125 MG tablet Take 1 tablet (3.125 mg total) by mouth 2 (two) times daily with a meal.  60 tablet  1  . furosemide (LASIX) 40 MG tablet Take 40 mg by mouth every other day.       . levothyroxine (SYNTHROID, LEVOTHROID) 200 MCG tablet Take 200 mcg by mouth daily. Take in addition to 25 mcg daily      . levothyroxine (SYNTHROID, LEVOTHROID) 25 MCG tablet Take 25 mcg by mouth every other day.      . lisinopril (PRINIVIL,ZESTRIL) 5 MG tablet Take 1 tablet (5 mg total) by mouth daily.  30 tablet  1  . potassium chloride (K-DUR) 10 MEQ tablet Take 10 mEq by mouth daily.      Marland Kitchen DISCONTD: albuterol (PROVENTIL HFA;VENTOLIN HFA) 108 (90 BASE) MCG/ACT inhaler Inhale 2 puffs into the lungs every 6 (six) hours as needed  for wheezing.  3.7 g  1  . DISCONTD: Fluticasone-Salmeterol (ADVAIR DISKUS) 250-50 MCG/DOSE AEPB Inhale 1 puff into the lungs 2 (two) times daily.  1 each  11  . DISCONTD: furosemide (LASIX) 40 MG tablet Take 1 tablet (40 mg total) by mouth 2 (two) times daily.  30 tablet  1  . DISCONTD: potassium chloride (K-DUR) 10 MEQ tablet Take 1 tablet (10 mEq total) by mouth 2 (two) times daily.  60 tablet  6  . DISCONTD: pravastatin (PRAVACHOL) 40 MG tablet Take 1 tablet (40 mg total) by mouth daily.  30 tablet  6  . DISCONTD: tiotropium (SPIRIVA HANDIHALER) 18 MCG inhalation capsule Place 1 capsule (18 mcg total) into inhaler and inhale daily.  30 capsule  12    Physical Exam:  BP 113/60  Pulse 60  Resp 18  Ht 5\' 9"  (1.753 m)  Wt 149 lb (67.586 kg)  BMI 22.00 kg/m2  SpO2 98%  Gen: no apparent distress Lungs: CTA bilaterally Heart: RRR Abd: soft non-tender, non distended Skin: incisions well healed, no evidence of acute infection Neuro: grossly intact  Diagnostic Tests:   CXR: small left pleural effusion  Impression:  [name] is doing well.  He continues to have some LE edema, which is not uncommon due to the CHF exacerbation he  experienced prior to his surgery.  In regards to patients weight loss, I explained to the patient that I am not concerned about that.  I feel it is most likely fluid that he has accumulated over the past several months.  In regards to his pathology I let the patient and his wife know that malignancy was not identified by pathology  Plan:  1. S/P CABG doing well 2. CHF- patient instructed on importance of monitoring weight gain and LE edema- he was instructed to contact Cardiologists office should he notice rapid weight gain of LE edema- he will remain on Lasix 3. Pleural Effusion- small in size on left, will repeat CXR in 2 weeks 4.RTC in 2 weeks with Dr. Tyrone Sage

## 2011-08-08 NOTE — Progress Notes (Signed)
Patient ID: [name], male    DOB: [birth year], 71 y.o.   MRN: [id number]  HPI Comments: Mr. Pianka is a pleasant 71 year old gentleman with a history of hyperlipidemia, long smoking history for 60 years, stent to his left lower extremity for claudication followed by Dr. Evette Cristal,   carotid showed 40% to 59% bilateral disease, subclavian disease on the left, who presents after cardiac catheterization showed severe left main disease, status post CABG x3 with LIMA to the LAD, vein graft to the PDA, vein graft to the OM.  He had surgery on 07/10/2011. Since then he has been feeling better, improved shortness of breath. He required Lasix following the surgery and reports now his abdomen, legs and lungs have less fluid. He continues to feel weak. Peak creatinine in the hospital was 2.18 His weight at home is 145 pounds. Weight in our office is 148 pounds.  He does have mild aortic valve stenosis with peak velocity 217 cm/s.  He had previous appointment with Dr. Kendrick Fries this was changed secondary to worsening coronary disease on catheterization.  EKG shows normal sinus rhythm with a rate 61 beats per minute with ST and T wave abnormality in leads V4 through V6, Leads 2, 3, aVF        Outpatient Encounter Prescriptions as of 08/08/2011  Medication Sig Dispense Refill  . amiodarone (PACERONE) 200 MG tablet Take 1 tablet (200 mg total) by mouth daily.  30 tablet  1  . aspirin EC 325 MG EC tablet Take 1 tablet (325 mg total) by mouth daily.  30 tablet    . atorvastatin (LIPITOR) 20 MG tablet Take 1 tablet (20 mg total) by mouth daily at 6 PM.  30 tablet  1  . carvedilol (COREG) 3.125 MG tablet Take 1 tablet (3.125 mg total) by mouth 2 (two) times daily with a meal.  60 tablet  1  . furosemide (LASIX) 40 MG tablet Take 40 mg by mouth every other day.       . levothyroxine (SYNTHROID, LEVOTHROID) 200 MCG tablet Take 200 mcg by mouth daily. Take in addition to 25 mcg daily      . levothyroxine (SYNTHROID,  LEVOTHROID) 25 MCG tablet Take 25 mcg by mouth every other day.      . lisinopril (PRINIVIL,ZESTRIL) 5 MG tablet Take 1 tablet (5 mg total) by mouth daily.  30 tablet  1  . potassium chloride (K-DUR) 10 MEQ tablet Take 10 mEq by mouth daily.       Review of Systems  Constitutional: Positive for fatigue and unexpected weight change.  HENT: Negative.   Eyes: Negative.   Respiratory: Positive for shortness of breath.   Cardiovascular: Negative.   Gastrointestinal: Negative.   Musculoskeletal: Negative.   Skin: Negative.   Neurological: Positive for weakness.  Hematological: Negative.   Psychiatric/Behavioral: Negative.   All other systems reviewed and are negative.    BP 106/65  Pulse 64  Ht 5\' 9"  (1.753 m)  Wt 148 lb (67.132 kg)  BMI 21.86 kg/m2  Physical Exam  Nursing note and vitals reviewed. Constitutional: He is oriented to person, place, and time. He appears well-developed and well-nourished.  HENT:  Head: Normocephalic.  Nose: Nose normal.  Mouth/Throat: Oropharynx is clear and moist.  Eyes: Conjunctivae are normal. Pupils are equal, round, and reactive to light.  Neck: Normal range of motion. Neck supple. No JVD present.  Cardiovascular: Normal rate, regular rhythm, S1 normal, S2 normal, normal heart sounds and intact distal pulses.  Exam reveals no gallop and no friction rub.   No murmur heard.      Well-healed mediastinal incision site  Pulmonary/Chest: Effort normal and breath sounds normal. No respiratory distress. He has no wheezes. He has no rales. He exhibits no tenderness.  Abdominal: Soft. Bowel sounds are normal. He exhibits no distension. There is no tenderness.  Musculoskeletal: Normal range of motion. He exhibits no edema and no tenderness.  Lymphadenopathy:    He has no cervical adenopathy.  Neurological: He is alert and oriented to person, place, and time. Coordination normal.  Skin: Skin is warm and dry. No rash noted. No erythema.  Psychiatric: He  has a normal mood and affect. His behavior is normal. Judgment and thought content normal.           Assessment and Plan

## 2011-08-08 NOTE — Assessment & Plan Note (Signed)
Annual ultrasound of his AAA will be ordered, next in 2014.

## 2011-08-08 NOTE — Assessment & Plan Note (Signed)
Bilateral moderate carotid arterial disease. Continue aggressive cholesterol management. Repeat carotid ultrasound in one year.

## 2011-08-08 NOTE — Assessment & Plan Note (Signed)
Severe coronary artery disease. We will continue aggressive cholesterol management.

## 2011-08-08 NOTE — Assessment & Plan Note (Signed)
We have suggested he schedule followup with Dr. Donnamae Jude for COPD management. He may need a repeat chest CT scan given the findings in the left lower lobe. He is high risk of lung cancer.

## 2011-08-08 NOTE — Assessment & Plan Note (Signed)
Blood pressure is well controlled on today's visit. No changes made to the medications. 

## 2011-08-08 NOTE — Assessment & Plan Note (Signed)
Shortness of breath has significantly improved following surgery. Weight is decreasing, he is more orthostatic on clinic visit today. We have suggested he hold his Lasix for several days and then restart it 3 times per week with close monitoring of his weight.

## 2011-08-08 NOTE — Assessment & Plan Note (Signed)
We'll have him discuss his significant left subclavian artery stenosis with Dr. Kirke Corin.

## 2011-08-09 DIAGNOSIS — I739 Peripheral vascular disease, unspecified: Secondary | ICD-10-CM | POA: Diagnosis not present

## 2011-08-09 DIAGNOSIS — I2510 Atherosclerotic heart disease of native coronary artery without angina pectoris: Secondary | ICD-10-CM | POA: Diagnosis not present

## 2011-08-09 DIAGNOSIS — Z951 Presence of aortocoronary bypass graft: Secondary | ICD-10-CM | POA: Diagnosis not present

## 2011-08-09 DIAGNOSIS — Z48812 Encounter for surgical aftercare following surgery on the circulatory system: Secondary | ICD-10-CM | POA: Diagnosis not present

## 2011-08-09 DIAGNOSIS — J449 Chronic obstructive pulmonary disease, unspecified: Secondary | ICD-10-CM | POA: Diagnosis not present

## 2011-08-09 DIAGNOSIS — I679 Cerebrovascular disease, unspecified: Secondary | ICD-10-CM | POA: Diagnosis not present

## 2011-08-22 ENCOUNTER — Telehealth: Payer: Self-pay | Admitting: Cardiovascular Disease

## 2011-08-22 NOTE — Telephone Encounter (Signed)
Pt wife called stating that pt referral for cardiac rehab has not been sent in. Pt wife upset because this has not been done yet. I apologized to her and let her know that Dr Mariah Milling was out of the office last week. Pt wants a call back when they have been sent. i don't see anything in the office note that mentions a referral. So i am not sure what happen with this.

## 2011-08-22 NOTE — Telephone Encounter (Signed)
Patient's wife notified cardiac rehab forms will be faxed today.

## 2011-08-23 ENCOUNTER — Other Ambulatory Visit: Payer: Self-pay | Admitting: Cardiothoracic Surgery

## 2011-08-23 DIAGNOSIS — I2510 Atherosclerotic heart disease of native coronary artery without angina pectoris: Secondary | ICD-10-CM

## 2011-08-25 ENCOUNTER — Encounter: Payer: Self-pay | Admitting: Cardiothoracic Surgery

## 2011-08-25 ENCOUNTER — Ambulatory Visit
Admission: RE | Admit: 2011-08-25 | Discharge: 2011-08-25 | Disposition: A | Discharge status: Home / Self Care | Payer: Medicare Other | Source: Ambulatory Visit | Attending: Cardiothoracic Surgery | Admitting: Cardiothoracic Surgery

## 2011-08-25 ENCOUNTER — Ambulatory Visit (INDEPENDENT_AMBULATORY_CARE_PROVIDER_SITE_OTHER): Payer: Self-pay | Admitting: Cardiothoracic Surgery

## 2011-08-25 VITALS — BP 124/61 | HR 62 | Resp 18 | Ht 69.0 in | Wt 150.0 lb

## 2011-08-25 DIAGNOSIS — R918 Other nonspecific abnormal finding of lung field: Secondary | ICD-10-CM | POA: Diagnosis not present

## 2011-08-25 DIAGNOSIS — I2510 Atherosclerotic heart disease of native coronary artery without angina pectoris: Secondary | ICD-10-CM

## 2011-08-25 DIAGNOSIS — J90 Pleural effusion, not elsewhere classified: Secondary | ICD-10-CM

## 2011-08-25 DIAGNOSIS — Z951 Presence of aortocoronary bypass graft: Secondary | ICD-10-CM

## 2011-08-25 DIAGNOSIS — I509 Heart failure, unspecified: Secondary | ICD-10-CM

## 2011-08-25 NOTE — Progress Notes (Signed)
[address]            [place] [address]          [phone]      [name] [facility] Medical Record #[id number] Date of Birth: January 20, 1941  Referring: [name], MD Primary Care: Kerby Nora, MD, MD  Chief Complaint:   POST OP FOLLOW UP OF PROCEDURE: 07/11/2011  DATE OF DISCHARGE:  OPERATIVE REPORT  PREOPERATIVE DIAGNOSIS: Critical three-vessel coronary artery disease  with left main and severe left ventricular dysfunction.  POSTOPERATIVE DIAGNOSIS: Critical three-vessel coronary artery disease  with left main and severe left ventricular dysfunction.  SURGICAL PROCEDURE: Coronary artery bypass grafting x3 with free left  internal mammary artery graft to the left anterior descending, reverse  saphenous vein graft to the obtuse marginal, reverse saphenous vein  graft to the posterior descending with right lower leg open endo vein  harvesting, and incidental mediastinal lymph node biopsy.   History of Present Illness:     Patient is a 71 year old male who presented with severe heart failure symptoms and exacerbation of COPD symptoms and was found to have severe coronary artery disease and underwent coronary artery bypass grafting. He had been a preop CT scan of the chest some nondescript adenopathy, moderate bilateral carotid disease, and severe LV dysfunction. Since discharge he does note marked improvement in his shortness of breath, he has become much more functional. Plans to start in cardiac rehabilitation next week.      Past Medical History  Diagnosis Date  . Thyroid disease     hypo  . Venous insufficiency   . Peripheral arterial disease   . Tobacco abuse   . COPD (chronic obstructive pulmonary disease)   . Arthritis   . Hypothyroidism      History  Smoking status  . Former Smoker -- 1.0 packs/day  . Types: Cigarettes  . Quit date: 04/27/2011  Smokeless tobacco  . Never Used  Comment: quit dec 2012    History    Alcohol Use No     No Known Allergies  Current Outpatient Prescriptions  Medication Sig Dispense Refill  . amiodarone (PACERONE) 200 MG tablet Take 1 tablet (200 mg total) by mouth daily.  30 tablet  1  . aspirin EC 325 MG EC tablet Take 1 tablet (325 mg total) by mouth daily.  30 tablet    . atorvastatin (LIPITOR) 20 MG tablet Take 1 tablet (20 mg total) by mouth daily at 6 PM.  30 tablet  1  . carvedilol (COREG) 3.125 MG tablet Take 1 tablet (3.125 mg total) by mouth 2 (two) times daily with a meal.  60 tablet  1  . furosemide (LASIX) 40 MG tablet Take 40 mg by mouth every other day.       . levothyroxine (SYNTHROID, LEVOTHROID) 200 MCG tablet Take 200 mcg by mouth daily. Take in addition to 25 mcg every other day      . levothyroxine (SYNTHROID, LEVOTHROID) 25 MCG tablet Take 25 mcg by mouth every other day.      . lisinopril (PRINIVIL,ZESTRIL) 5 MG tablet Take 1 tablet (5 mg total) by mouth daily.  30 tablet  1  . potassium chloride (K-DUR) 10 MEQ tablet Take 10 mEq by mouth daily.      Marland Kitchen DISCONTD: albuterol (PROVENTIL HFA;VENTOLIN HFA) 108 (90 BASE) MCG/ACT inhaler Inhale 2 puffs into the  lungs every 6 (six) hours as needed for wheezing.  3.7 g  1  . DISCONTD: Fluticasone-Salmeterol (ADVAIR DISKUS) 250-50 MCG/DOSE AEPB Inhale 1 puff into the lungs 2 (two) times daily.  1 each  11  . DISCONTD: pravastatin (PRAVACHOL) 40 MG tablet Take 1 tablet (40 mg total) by mouth daily.  30 tablet  6  . DISCONTD: tiotropium (SPIRIVA HANDIHALER) 18 MCG inhalation capsule Place 1 capsule (18 mcg total) into inhaler and inhale daily.  30 capsule  12       Physical Exam: BP 124/61  Pulse 62  Resp 18  Ht 5\' 9"  (1.753 m)  Wt 150 lb (68.04 kg)  BMI 22.15 kg/m2  SpO2 92%  General appearance: alert and cooperative Neurologic: intact Heart: regular rate and rhythm, S1, S2 normal, no murmur, click, rub or gallop and normal apical impulse Lungs: clear to auscultation bilaterally and normal  percussion bilaterally Abdomen: soft, non-tender; bowel sounds normal; no masses,  no organomegaly Wound: sternum stable   Diagnostic Studies & Laboratory data:     Recent Radiology Findings:   Dg Chest 2 View  08/25/2011  *RADIOLOGY REPORT*  Clinical Data: Recent heart surgery.  CHEST - 2 VIEW  Comparison: 08/08/2011.  Findings: Trachea is midline.  Heart size stable.  Sternotomy wires are stable in position. Probable mild pleural parenchymal scarring at the base of the left hemithorax.  Lungs are hyperinflated but otherwise clear.  Degenerative changes are seen in the spine.  IMPRESSION: Probable developing pleural parenchymal scarring at the base of the left hemithorax.  Original Report Authenticated By: Reyes Ivan, M.D.      Recent Lab Findings: Lab Results  Component Value Date   WBC 12.0* 07/15/2011   HGB 11.7* 07/15/2011   HCT 36.7* 07/15/2011   PLT 174 07/15/2011   GLUCOSE 83 07/22/2011   CHOL 200 12/22/2010   TRIG 85.0 12/22/2010   HDL 65.90 12/22/2010   LDLCALC 117* 12/22/2010   ALT 69* 07/20/2011   AST 28 07/20/2011   NA 138 07/22/2011   K 4.2 07/22/2011   CL 99 07/22/2011   CREATININE 1.50* 07/22/2011   BUN 28* 07/22/2011   CO2 31 07/22/2011   TSH 1.033 04/22/2011   INR 1.69* 07/11/2011   HGBA1C 5.4 07/11/2011   Echo AT discharge: [facility]* *[facility]* [address] [place], [place] [address] [phone]  ------------------------------------------------------------ Transthoracic Echocardiography  Patient: Buster, Schueller MR #: [id number] Study Date: 07/22/2011 Gender: M Age: 71 Height: 175.3cm Weight: 71.4kg BSA: 1.51m^2 Pt. Status: Room: 2019  PERFORMING [facility] ATTENDING Sheliah Plane SONOGRAPHER [facility], RDCS ORDERING Joycelyn Man, Donielle ADMITTING Mariah Milling,  Timothy cc:  ------------------------------------------------------------  ------------------------------------------------------------ Indications: COPD 496.  ------------------------------------------------------------ History: PMH: Coronary artery disease. Risk factors: Current tobacco use. Hypertension.  ------------------------------------------------------------ Study Conclusions  - Left ventricle: LVEF is approximately 35 to 40% with severe hypokinesis of septum and inferior wall.; mild elsewhere. The cavity size was normal. Wall thickness was normal. - Aortic valve: AV is thickened, calcified with restricted motion. Peak and mean gradientsthrough the valve are22 mm 11Hg. AVA is calculated at 1.4 cm2. Gradients may be brought down by depressed LVEF By 2D images AS appears at least moderateto severe. Valve area: 1.19cm^2(VTI). Valve area: 1.32cm^2 (Vmax). - Right ventricle: Systolic function was mildly reduced. - Pulmonary arteries: PA peak pressure: 37mm Hg (S). Transthoracic echocardiography. M-mode, complete 2D, spectral Doppler, and color Doppler. Height: Height: 175.3cm. Height: 69in. Weight: Weight: 71.4kg. Weight: 157.1lb. Body mass index:  BMI: 23.2kg/m^2. Body surface area: BSA: 1.71m^2. Blood pressure: 114/60. Patient status: Inpatient. Location: Bedside.  ------------------------------------------------------------  ------------------------------------------------------------ Left ventricle: LVEF is approximately 35 to 40% with severe hypokinesis of septum and inferior wall.; mild elsewhere. The cavity size was normal. Wall thickness was normal.  ------------------------------------------------------------ Aortic valve: AV is thickened, calcified with restricted motion. Peak and mean gradientsthrough the valve are22 mm 11Hg. AVA is calculated at 1.4 cm2. Gradients may be brought down by depressed LVEF By 2D images AS appears at least moderateto severe.  Doppler: VTI ratio of LVOT to aortic valve: 0.38. Valve area: 1.19cm^2(VTI). Indexed valve area: 0.64cm^2/m^2 (VTI). Peak velocity ratio of LVOT to aortic valve: 0.42. Valve area: 1.32cm^2 (Vmax). Indexed valve area: 0.71cm^2/m^2 (Vmax). Mean gradient: 10mm Hg (S). Peak gradient: 19mm Hg (S).  ------------------------------------------------------------ Mitral valve: Structurally normal valve. Leaflet separation was normal. Doppler: Transvalvular velocity was within the normal range. There was no evidence for stenosis. Trivial regurgitation. Peak gradient: 4mm Hg (D).  ------------------------------------------------------------ Left atrium: The atrium was normal in size.  ------------------------------------------------------------ Right ventricle: The cavity size was normal. Wall thickness was normal. Systolic function was mildly reduced.  ------------------------------------------------------------ Pulmonic valve: Structurally normal valve. Cusp separation was normal. Doppler: Transvalvular velocity was within the normal range. No regurgitation.  ------------------------------------------------------------ Tricuspid valve: Doppler: Mild regurgitation.  ------------------------------------------------------------ Right atrium: The atrium was normal in size.  ------------------------------------------------------------ Pericardium: There was no pericardial effusion.  ------------------------------------------------------------  2D measurements Normal Doppler measurements Normal Left ventricle Main pulmonary LVID ED, 49.8 mm 43-52 artery chord, Pressure, 37 mm Hg =30 PLAX S LVID ES, 46.1 mm 23-38 Left ventricle chord, Ea, lat 6.73 cm/s ------ PLAX ann, tiss FS, chord, 7 % >29 DP PLAX E/Ea, lat 14.3 ------ LVPW, ED 9.97 mm ------ ann, tiss 7 IVS/LVPW 1.16 <1.3 DP ratio, ED Ea, med 5.43 cm/s ------ Ventricular septum ann, tiss IVS, ED 11.6 mm ------ DP LVOT E/Ea, med  17.8 ------ Diam, S 20 mm ------ ann, tiss 1 Area 3.14 cm^2 ------ DP Aorta LVOT Root diam, 30 mm ------ Peak vel, 91 cm/s ------ ED S Left atrium VTI, S 16 cm ------ AP dim 35 mm ------ Aortic valve AP dim 1.87 cm/m^2 <2.2 Peak vel, 217 cm/s ------ index S Mean vel, 146 cm/s ------ S VTI, S 42.3 cm ------ Mean 10 mm Hg ------ gradient, S Peak 19 mm Hg ------ gradient, S VTI ratio 0.38 ------ LVOT/AV Area, VTI 1.19 cm^2 ------ Area index 0.64 cm^2/m ------ (VTI) ^2 Peak vel 0.42 ------ ratio, LVOT/AV Area, Vmax 1.32 cm^2 ------ Area index 0.71 cm^2/m ------ (Vmax) ^2 Mitral valve Peak E vel 96.7 cm/s ------ Peak A vel 44.4 cm/s ------ Decelerati 197 ms 150-23 on time 0 Peak 4 mm Hg ------ gradient, D Peak E/A 2.2 ------ ratio Tricuspid valve Regurg 259 cm/s ------ peak vel Peak RV-RA 27 mm Hg ------ gradient, S Systemic veins Estimated 10 mm Hg ------ CVP Right ventricle Pressure, 37 mm Hg <30 S Sa vel, 8.19 cm/s ------ lat ann, tiss DP  ------------------------------------------------------------ Prepared and Electronically Authenticated by  Dietrich Pates, MD 2013-03-29T10:50:[id number]    Assessment / Plan:      Patient status post coronary artery bypass grafting, with underlying COPD in LV dysfunction At the time of discharge ejection fraction by echo had increased to 35-40% He is to start in cardiac rehabilitation in [facility] next week The patient has an appointment with pulmonary to followup with his CT scan of the chest and underlying pulmonary disease. Patient will contact  Dr. Brynda Peon office to discuss when to discontinue his Pacerone. Plan to see the patient back when necessary and Dr. Lenard Galloway request.   [name] MD  Beeper [phone] Office [phone] 08/25/2011 5:32 PM

## 2011-08-26 ENCOUNTER — Encounter: Payer: Self-pay | Admitting: Cardiovascular Disease

## 2011-08-26 DIAGNOSIS — Z5189 Encounter for other specified aftercare: Secondary | ICD-10-CM | POA: Diagnosis not present

## 2011-08-26 DIAGNOSIS — Z951 Presence of aortocoronary bypass graft: Secondary | ICD-10-CM | POA: Diagnosis not present

## 2011-08-29 ENCOUNTER — Telehealth: Payer: Self-pay | Admitting: Cardiovascular Disease

## 2011-08-29 NOTE — Telephone Encounter (Signed)
Pt wife calling has questions concerning his pacerone. Pt needs to know should pt continue medication

## 2011-08-29 NOTE — Telephone Encounter (Signed)
Patient's wife called wanting to know if patient needs to still take pacerone.States she was ask by Dr.Gerhardt if patient needs to still take pacerone, can interfere with thyroid medication.Fowarded to [facility] for advise.

## 2011-08-31 NOTE — Telephone Encounter (Signed)
Amiodarone was used to maintain normal sinus rhythm following his surgery. I was hesitant to discontinue this shortly following surgery. We can hold it for now and closely monitor him. He'll need periodic EKGs to make sure he does not convert back to atrial fibrillation.

## 2011-09-01 NOTE — Telephone Encounter (Signed)
Spoke with his wife and they are going to continue the medication until the end of month and will keep his follow up with Dr Lewie Loron as scheduled.  They are going to call Dr Windell Hummingbird office at the end of the month to discuss the continuation of all the medications started in the hospital as they have no refills.  I let the wife know I would call the office and have someone from there call her to make an appointment for the end of the month to discuss the issues.  She was very Adult nurse.  Spoke with Asher Muir and she is going to call and set up appointment for patient

## 2011-09-12 DIAGNOSIS — H2510 Age-related nuclear cataract, unspecified eye: Secondary | ICD-10-CM | POA: Diagnosis not present

## 2011-09-16 ENCOUNTER — Encounter: Payer: Self-pay | Admitting: Cardiovascular Disease

## 2011-09-16 ENCOUNTER — Ambulatory Visit (INDEPENDENT_AMBULATORY_CARE_PROVIDER_SITE_OTHER): Payer: Medicare Other | Admitting: Cardiovascular Disease

## 2011-09-16 ENCOUNTER — Other Ambulatory Visit: Payer: Self-pay

## 2011-09-16 VITALS — BP 108/64 | HR 67 | Ht 69.0 in | Wt 150.5 lb

## 2011-09-16 DIAGNOSIS — J90 Pleural effusion, not elsewhere classified: Secondary | ICD-10-CM | POA: Diagnosis not present

## 2011-09-16 DIAGNOSIS — E7800 Pure hypercholesterolemia, unspecified: Secondary | ICD-10-CM | POA: Diagnosis not present

## 2011-09-16 DIAGNOSIS — R0602 Shortness of breath: Secondary | ICD-10-CM

## 2011-09-16 DIAGNOSIS — I2589 Other forms of chronic ischemic heart disease: Secondary | ICD-10-CM

## 2011-09-16 DIAGNOSIS — I2510 Atherosclerotic heart disease of native coronary artery without angina pectoris: Secondary | ICD-10-CM | POA: Diagnosis not present

## 2011-09-16 DIAGNOSIS — I255 Ischemic cardiomyopathy: Secondary | ICD-10-CM

## 2011-09-16 MED ORDER — LISINOPRIL 5 MG PO TABS: 5.0000 mg | ORAL_TABLET | Freq: Every day | ORAL | Status: DC

## 2011-09-16 MED ORDER — CARVEDILOL 3.125 MG PO TABS: 3.1250 mg | ORAL_TABLET | Freq: Two times a day (BID) | ORAL | Status: DC

## 2011-09-16 MED ORDER — POTASSIUM CHLORIDE ER 10 MEQ PO TBCR: 10.0000 meq | EXTENDED_RELEASE_TABLET | Freq: Every day | ORAL | Status: DC

## 2011-09-16 MED ORDER — FUROSEMIDE 40 MG PO TABS: 40.0000 mg | ORAL_TABLET | ORAL | Status: DC

## 2011-09-16 MED ORDER — PREDNISONE 20 MG PO TABS: 20.0000 mg | ORAL_TABLET | Freq: Every day | ORAL | Status: AC

## 2011-09-16 MED ORDER — ATORVASTATIN CALCIUM 20 MG PO TABS: 20.0000 mg | ORAL_TABLET | Freq: Every day | ORAL | Status: DC

## 2011-09-16 MED ORDER — AMIODARONE HCL 200 MG PO TABS: 200.0000 mg | ORAL_TABLET | Freq: Every day | ORAL | Status: DC

## 2011-09-16 MED ORDER — LEVOFLOXACIN 500 MG PO TABS: 500.0000 mg | ORAL_TABLET | Freq: Every day | ORAL | Status: AC

## 2011-09-16 NOTE — Assessment & Plan Note (Signed)
We have recommended that he stay on his Lipitor. 

## 2011-09-16 NOTE — Assessment & Plan Note (Signed)
We have suggested he call Dr. Kendrick Fries for followup in his office given his recent severe shortness of breath.

## 2011-09-16 NOTE — Progress Notes (Signed)
Patient ID: [name], male    DOB: [birth year], 71 y.o.   MRN: [id number]  HPI Comments: [name] is a pleasant 71 year old gentleman with a history of hyperlipidemia, long smoking history for 60 years, COPD , stent to his left lower extremity for claudication followed by Dr. Evette Cristal,   carotid showed 40% to 59% bilateral disease, subclavian disease on the left, who presents after cardiac catheterization showed severe left main disease, status post CABG x3 with LIMA to the LAD, vein graft to the PDA, vein graft to the OM. He had surgery on 07/10/2011.  He does have mild aortic valve stenosis with peak velocity 217 cm/s.  He presents today and reports having increasing shortness of breath this past week. He try doubling his Lasix with no improvement of his symptoms. He has a cough though sometimes not able to produce much sputum. It is hacking, keeps him awake at night, also bad sitting up. He has noticed significant shortness of breath with exertion. No significant edema or abdominal swelling.   EKG shows normal sinus rhythm with a rate 69 beats per minute with ST and T wave abnormality in lead V6, Leads 2, 3, aVF       Outpatient Encounter Prescriptions as of 09/16/2011  Medication Sig Dispense Refill  . amiodarone (PACERONE) 200 MG tablet Take 1 tablet (200 mg total) by mouth daily.  30 tablet  1  . aspirin EC 325 MG EC tablet Take 1 tablet (325 mg total) by mouth daily.  30 tablet    . atorvastatin (LIPITOR) 20 MG tablet Take 1 tablet (20 mg total) by mouth daily at 6 PM.  90 tablet  3  . carvedilol (COREG) 3.125 MG tablet Take 1 tablet (3.125 mg total) by mouth 2 (two) times daily with a meal.  180 tablet  3  . furosemide (LASIX) 40 MG tablet Take 1 tablet (40 mg total) by mouth every other day.  90 tablet  3  . levothyroxine (SYNTHROID, LEVOTHROID) 200 MCG tablet Take 200 mcg by mouth daily. Take in addition to 25 mcg every other day      . levothyroxine (SYNTHROID, LEVOTHROID) 25 MCG  tablet Take 25 mcg by mouth every other day.      . lisinopril (PRINIVIL,ZESTRIL) 5 MG tablet Take 1 tablet (5 mg total) by mouth daily.  90 tablet  3  . potassium chloride (K-DUR) 10 MEQ tablet Take 1 tablet (10 mEq total) by mouth daily.  90 tablet  3  . levofloxacin (LEVAQUIN) 500 MG tablet Take 1 tablet (500 mg total) by mouth daily.  10 tablet  1  . predniSONE (DELTASONE) 20 MG tablet Take 1 tablet (20 mg total) by mouth daily.  20 tablet  1   Review of Systems  HENT: Negative.   Eyes: Negative.   Respiratory: Positive for choking, shortness of breath and wheezing.   Cardiovascular: Negative.   Gastrointestinal: Negative.   Musculoskeletal: Negative.   Skin: Negative.   Neurological: Positive for weakness.  Hematological: Negative.   Psychiatric/Behavioral: Negative.   All other systems reviewed and are negative.    BP 108/64  Pulse 67  Ht 5\' 9"  (1.753 m)  Wt 150 lb 8 oz (68.266 kg)  BMI 22.22 kg/m2  Physical Exam  Nursing note and vitals reviewed. Constitutional: He is oriented to person, place, and time. He appears well-developed and well-nourished.  HENT:  Head: Normocephalic.  Nose: Nose normal.  Mouth/Throat: Oropharynx is clear and moist.  Eyes: Conjunctivae  are normal. Pupils are equal, round, and reactive to light.  Neck: Normal range of motion. Neck supple. No JVD present.  Cardiovascular: Normal rate, regular rhythm, S1 normal, S2 normal, normal heart sounds and intact distal pulses.  Exam reveals no gallop and no friction rub.   No murmur heard.      Well-healed mediastinal incision site  Pulmonary/Chest: He is in respiratory distress. He has decreased breath sounds. He has wheezes. He has rales. He exhibits no tenderness.  Abdominal: Soft. Bowel sounds are normal. He exhibits no distension. There is no tenderness.  Musculoskeletal: Normal range of motion. He exhibits no edema and no tenderness.  Lymphadenopathy:    He has no cervical adenopathy.    Neurological: He is alert and oriented to person, place, and time. Coordination normal.  Skin: Skin is warm and dry. No rash noted. No erythema.  Psychiatric: He has a normal mood and affect. His behavior is normal. Judgment and thought content normal.           Assessment and Plan

## 2011-09-16 NOTE — Patient Instructions (Addendum)
Please start prednisone taper, 60 mg x 3 days, 40 mg x 3 days, 20 mg x 3 days, 10 mg x 3 days Start levoquin one a day for 10 days  Call the office on Tuesday to let us know how you are feeling Please go for Chest Xray today  Once your breathing is back to baseline, stop the pacerone/amiodarone  Please call us if you have new issues that need to be addressed before your next appt.  Your physician wants you to follow-up in: July 15th

## 2011-09-16 NOTE — Assessment & Plan Note (Signed)
Shortness of breath likely secondary to COPD exacerbation. We will start him on a prednisone taper and Levaquin for 10 days. Symptoms are quite significant and we have suggested if there is no immediate improvement through the course of the long holiday weekend that he go to the emergency room.   Chest x-ray showed mild to moderate left pleural effusion. Lateral decubitus film was not performed as the effusion was small.

## 2011-09-16 NOTE — Assessment & Plan Note (Signed)
Symptoms do not seem to be consistent with systolic or diastolic CHF. Pleural effusion has not changed significantly. We'll continue medical management with no significant medication changes. Continue Lasix daily, holding Lasix for hypotension, possible dehydration.

## 2011-09-16 NOTE — Assessment & Plan Note (Signed)
Currently with no symptoms of angina. No further workup at this time. Continue current medication regimen. If shortness of breath persists, additional workup could be performed.

## 2011-09-20 ENCOUNTER — Other Ambulatory Visit: Payer: Self-pay | Admitting: *Deleted

## 2011-09-20 MED ORDER — AMIODARONE HCL 200 MG PO TABS: 200.0000 mg | ORAL_TABLET | Freq: Every day | ORAL | Status: DC

## 2011-09-20 MED ORDER — ATORVASTATIN CALCIUM 20 MG PO TABS: 20.0000 mg | ORAL_TABLET | Freq: Every day | ORAL | Status: DC

## 2011-09-20 MED ORDER — LISINOPRIL 5 MG PO TABS: 5.0000 mg | ORAL_TABLET | Freq: Every day | ORAL | Status: DC

## 2011-09-20 MED ORDER — CARVEDILOL 3.125 MG PO TABS: 3.1250 mg | ORAL_TABLET | Freq: Two times a day (BID) | ORAL | Status: DC

## 2011-09-24 ENCOUNTER — Encounter: Payer: Self-pay | Admitting: Cardiovascular Disease

## 2011-09-24 DIAGNOSIS — Z5189 Encounter for other specified aftercare: Secondary | ICD-10-CM | POA: Diagnosis not present

## 2011-09-24 DIAGNOSIS — Z951 Presence of aortocoronary bypass graft: Secondary | ICD-10-CM | POA: Diagnosis not present

## 2011-09-27 ENCOUNTER — Other Ambulatory Visit: Payer: Self-pay

## 2011-09-27 MED ORDER — CARVEDILOL 3.125 MG PO TABS: 3.1250 mg | ORAL_TABLET | Freq: Two times a day (BID) | ORAL | Status: DC

## 2011-09-27 MED ORDER — ATORVASTATIN CALCIUM 20 MG PO TABS: 20.0000 mg | ORAL_TABLET | Freq: Every day | ORAL | Status: DC

## 2011-09-27 MED ORDER — AMIODARONE HCL 200 MG PO TABS: 200.0000 mg | ORAL_TABLET | Freq: Every day | ORAL | Status: DC

## 2011-09-27 MED ORDER — LISINOPRIL 5 MG PO TABS: 5.0000 mg | ORAL_TABLET | Freq: Every day | ORAL | Status: DC

## 2011-09-27 NOTE — Telephone Encounter (Signed)
Need to refill the carvedilol, amiodarone, atorvastatin and lisinopril.

## 2011-10-07 ENCOUNTER — Ambulatory Visit (INDEPENDENT_AMBULATORY_CARE_PROVIDER_SITE_OTHER): Payer: Medicare Other | Admitting: Pulmonary Disease

## 2011-10-07 ENCOUNTER — Encounter: Payer: Self-pay | Admitting: Pulmonary Disease

## 2011-10-07 VITALS — BP 142/62 | HR 65 | Temp 97.8°F | Ht 69.0 in | Wt 153.8 lb

## 2011-10-07 DIAGNOSIS — J449 Chronic obstructive pulmonary disease, unspecified: Secondary | ICD-10-CM

## 2011-10-07 DIAGNOSIS — J90 Pleural effusion, not elsewhere classified: Secondary | ICD-10-CM | POA: Diagnosis not present

## 2011-10-07 MED ORDER — IPRATROPIUM BROMIDE 0.03 % NA SOLN: 2.0000 | Freq: Two times a day (BID) | NASAL | Status: DC | PRN

## 2011-10-07 MED ORDER — TIOTROPIUM BROMIDE MONOHYDRATE 18 MCG IN CAPS: 18.0000 ug | ORAL_CAPSULE | Freq: Every day | RESPIRATORY_TRACT | Status: DC

## 2011-10-07 NOTE — Assessment & Plan Note (Signed)
This has been noted after his cardiac surgery.  Because it is small and appears to be shrinking, I believe it is related to his surgery.  It is likely not contributing significantly to his dyspnea so I don't recommend a thoracentesis at this time.  We'll repeat a CXR to keep an eye on it.

## 2011-10-07 NOTE — Progress Notes (Signed)
Subjective:    Patient ID: [name], male    DOB: 07/22/1940, 71 y.o.   MRN: [id number]  HPI This is a pleasant 71 y/o male who is referred to [place] for COPD.  He had a normal childhood without respiratory problems and never had too much trouble in adulthood until 2012 when he developed a lot of chest congestion, cough, and shortness of breath.  He was diagnosed with pneumonia and COPD and placed on "a bunch of inhalers that didn't work".  He eventually developed leg swelling so he had a heart evaluation which demonstrated ischaemic cardiomyopathy.  He underwent a CABG on 07/08/2011 and tolerated the procedure well.  Since then he states that his dyspnea has nearly gone away, but review of notes reveals that Dr. Mariah Milling had to treat him with prednisone and antibiotics last month for chest congestion and COPD.   Mr. Quintanar has not been using spiriva because he thinks that it doesn't work.  He quit smoking in January but prior to this he had a 40 pack year smoking history.  He notes that he is participating in pulmonary rehab and can walk 30 minutes on a treadmill.  Overall his symptoms have improved but he still notes an occasional "tickle" in his throat.  He doesn't think that he wheezes.  He has also been noted to have a small left sided pleural effusion.    Past Medical History  Diagnosis Date  . Thyroid disease     hypo  . Venous insufficiency   . Peripheral arterial disease   . Tobacco abuse   . COPD (chronic obstructive pulmonary disease)   . Arthritis   . Hypothyroidism      Family History  Problem Relation Age of Onset  . Alcohol abuse Father   . Cirrhosis Father   . Hypothyroidism Sister      History   Social History  . Marital Status: Married    Spouse Name: N/A    Number of Children: 2  . Years of Education: N/A   Occupational History  . Retired     Comptroller   Social History Main Topics  . Smoking status: Former Smoker -- 1.0 packs/day for 50 years   Types: Cigarettes    Quit date: 04/27/2011  . Smokeless tobacco: Never Used  . Alcohol Use: No  . Drug Use: No  . Sexually Active: Not Currently   Other Topics Concern  . Not on file   Social History Narrative   Exercsiing 3 times a week. Moderate diet control. O living will, no HCPOA.     No Known Allergies   Outpatient Prescriptions Prior to Visit  Medication Sig Dispense Refill  . amiodarone (PACERONE) 200 MG tablet Take 1 tablet (200 mg total) by mouth daily.  30 tablet  3  . aspirin EC 325 MG EC tablet Take 1 tablet (325 mg total) by mouth daily.  30 tablet    . atorvastatin (LIPITOR) 20 MG tablet Take 1 tablet (20 mg total) by mouth daily at 6 PM.  30 tablet  3  . carvedilol (COREG) 3.125 MG tablet Take 1 tablet (3.125 mg total) by mouth 2 (two) times daily with a meal.  60 tablet  3  . levothyroxine (SYNTHROID, LEVOTHROID) 200 MCG tablet Take 200 mcg by mouth daily. Take in addition to 25 mcg every other day      . levothyroxine (SYNTHROID, LEVOTHROID) 25 MCG tablet Take 25 mcg by mouth every other day.      Marland Kitchen  lisinopril (PRINIVIL,ZESTRIL) 5 MG tablet Take 1 tablet (5 mg total) by mouth daily.  30 tablet  3  . potassium chloride (K-DUR) 10 MEQ tablet Take 1 tablet (10 mEq total) by mouth daily.  90 tablet  3  . furosemide (LASIX) 40 MG tablet Take 1 tablet (40 mg total) by mouth every other day.  90 tablet  3      Review of Systems  Constitutional: Negative for fever, chills, activity change and appetite change.  HENT: Positive for rhinorrhea. Negative for hearing loss, ear pain, congestion, sneezing, neck pain, neck stiffness, postnasal drip and sinus pressure.   Eyes: Negative for redness, itching and visual disturbance.  Respiratory: Negative for cough, chest tightness, shortness of breath and wheezing.   Cardiovascular: Negative for chest pain, palpitations and leg swelling.  Gastrointestinal: Negative for nausea, vomiting, abdominal pain, diarrhea, constipation, blood  in stool and abdominal distention.  Musculoskeletal: Negative for myalgias, joint swelling, arthralgias and gait problem.  Skin: Negative for rash.  Neurological: Negative for dizziness, light-headedness, numbness and headaches.  Hematological: Does not bruise/bleed easily.  Psychiatric/Behavioral: Negative for confusion and dysphoric mood.       Objective:   Physical Exam  Filed Vitals:   10/07/11 1352  BP: 142/62  Pulse: 65  Temp: 97.8 F (36.6 C)  TempSrc: Oral  Height: 5\' 9"  (1.753 m)  Weight: 153 lb 12.8 oz (69.763 kg)  SpO2: 99%   Gen: well appearing, no acute distress HEENT: NCAT, PERRL, EOMi, OP clear, neck supple without masses PULM: Exp wheezes in bases bilaterally CV: RRR, no mgr, no JVD AB: BS+, soft, nontender, no hsm Ext: warm, no edema, no clubbing, no cyanosis Derm: no rash or skin breakdown Neuro: A&Ox4, CN II-XII intact, strength 5/5 in all 4 extremities  10/07/2011 Spirometry: Ratio 57%, FEV1 1.89 L (56% pred) 08/2011 CXR: sternal wires, small left effusion    Assessment & Plan:   COPD (chronic obstructive pulmonary disease) COPD: GOLD Grade A  Combined recommendations from the [employer], [employer], Designer, television/film set, European Respiratory Society (Qaseem A et al, Ann Intern Med. 2011;155(3):179) recommends tobacco cessation, pulmonary rehab (for symptomatic patients with an FEV1 < 50% predicted), supplemental oxygen (for patients with SaO2 <88% or paO2 <55), and appropriate bronchodilator therapy.  In regards to long acting bronchodilators, they recommend monotherapy (FEV1 60-80% with symptoms weak evidence, FEV1 with symptoms <60% strong evidence), or combination therapy (FEV1 <60% with symptoms, strong recommendation, moderate evidence).  One should also provide patients with annual immunizations and consider therapy for prevention of COPD exacerbations (ie. roflumilast or azithromycin) when  appopriate.  -O2 therapy: not indicated -Immunizations: up to date -Tobacco use: previous smoker, advised at length to stay off cigarettes  -Exercise: encouraged to exercise regularly -Bronchodilator therapy: start spiriva given moderate obstruction on PFT's and what sounds like an exacerbation treated by Dr. Mariah Milling recently; I advised that if he continues to improve he may not need this if he is doing great next year as he continues to recover from all the heart trouble he had earlier in the year. -Exacerbation prevention: spiriva   Pleural effusion This has been noted after his cardiac surgery.  Because it is small and appears to be shrinking, I believe it is related to his surgery.  It is likely not contributing significantly to his dyspnea so I don't recommend a thoracentesis at this time.  We'll repeat a CXR to keep an eye on it.   Updated  Medication List Outpatient Encounter Prescriptions as of 10/07/2011  Medication Sig Dispense Refill  . amiodarone (PACERONE) 200 MG tablet Take 1 tablet (200 mg total) by mouth daily.  30 tablet  3  . aspirin EC 325 MG EC tablet Take 1 tablet (325 mg total) by mouth daily.  30 tablet    . atorvastatin (LIPITOR) 20 MG tablet Take 1 tablet (20 mg total) by mouth daily at 6 PM.  30 tablet  3  . carvedilol (COREG) 3.125 MG tablet Take 1 tablet (3.125 mg total) by mouth 2 (two) times daily with a meal.  60 tablet  3  . furosemide (LASIX) 40 MG tablet Take 40 mg by mouth daily as needed.      Marland Kitchen levothyroxine (SYNTHROID, LEVOTHROID) 200 MCG tablet Take 200 mcg by mouth daily. Take in addition to 25 mcg every other day      . levothyroxine (SYNTHROID, LEVOTHROID) 25 MCG tablet Take 25 mcg by mouth every other day.      . lisinopril (PRINIVIL,ZESTRIL) 5 MG tablet Take 1 tablet (5 mg total) by mouth daily.  30 tablet  3  . potassium chloride (K-DUR) 10 MEQ tablet Take 10 mEq by mouth daily. Only when takes lasix      . DISCONTD: potassium chloride (K-DUR) 10  MEQ tablet Take 1 tablet (10 mEq total) by mouth daily.  90 tablet  3  . ipratropium (ATROVENT) 0.03 % nasal spray Place 2 sprays into the nose 2 (two) times daily as needed for rhinitis.  30 mL  3  . tiotropium (SPIRIVA HANDIHALER) 18 MCG inhalation capsule Place 1 capsule (18 mcg total) into inhaler and inhale daily.  30 capsule  3  . DISCONTD: furosemide (LASIX) 40 MG tablet Take 1 tablet (40 mg total) by mouth every other day.  90 tablet  3

## 2011-10-07 NOTE — Patient Instructions (Signed)
Take spiriva daily (this is a medication that you take every day no matter what, not as needed) Keep working with cardio rehab as you are doing We will send you for a chest x-ray next week to make sure that the fluid in your left lung is improving and will call you after we see the result. Take Lloyd Huger Med saline rinses as needed for the shortness of breath. If the Ferdinand Med rinses don't work, try the ipratropium nasal sprays.  Watch out for dry eyes, blurry vision and urinary retention as this can rarely occur in people using spiriva and ipratropium. Be sure to get a flu shot annually  We will see you back in 3 months or sooner if needed.

## 2011-10-07 NOTE — Assessment & Plan Note (Signed)
COPD: GOLD Grade A  Combined recommendations from the [employer]	[place], [employer], Designer, television/film set, European Respiratory Society (Qaseem A et al, Ann Intern Med. 2011;155(3):179) recommends tobacco cessation, pulmonary rehab (for symptomatic patients with an FEV1 < 50% predicted), supplemental oxygen (for patients with SaO2 <88% or paO2 <55), and appropriate bronchodilator therapy.  In regards to long acting bronchodilators, they recommend monotherapy (FEV1 60-80% with symptoms weak evidence, FEV1 with symptoms <60% strong evidence), or combination therapy (FEV1 <60% with symptoms, strong recommendation, moderate evidence).  One should also provide patients with annual immunizations and consider therapy for prevention of COPD exacerbations (ie. roflumilast or azithromycin) when appopriate.  -O2 therapy: not indicated -Immunizations: up to date -Tobacco use: previous smoker, advised at length to stay off cigarettes  -Exercise: encouraged to exercise regularly -Bronchodilator therapy: start spiriva given moderate obstruction on PFT's and what sounds like an exacerbation treated by Dr. Mariah Milling recently; I advised that if he continues to improve he may not need this if he is doing great next year as he continues to recover from all the heart trouble he had earlier in the year. -Exacerbation prevention: spiriva

## 2011-10-11 ENCOUNTER — Ambulatory Visit (INDEPENDENT_AMBULATORY_CARE_PROVIDER_SITE_OTHER)
Admission: RE | Admit: 2011-10-11 | Discharge: 2011-10-11 | Disposition: A | Discharge status: Home / Self Care | Payer: Medicare Other | Source: Ambulatory Visit | Attending: Pulmonary Disease | Admitting: Pulmonary Disease

## 2011-10-11 DIAGNOSIS — J449 Chronic obstructive pulmonary disease, unspecified: Secondary | ICD-10-CM | POA: Diagnosis not present

## 2011-10-12 NOTE — Progress Notes (Signed)
Quick Note:  Spoke with pt and notified of results per Dr. Wert. Pt verbalized understanding and denied any questions.  ______ 

## 2011-10-20 ENCOUNTER — Other Ambulatory Visit: Payer: Self-pay

## 2011-10-20 MED ORDER — AMIODARONE HCL 200 MG PO TABS: 200.0000 mg | ORAL_TABLET | Freq: Every day | ORAL | Status: DC

## 2011-10-20 MED ORDER — ATORVASTATIN CALCIUM 20 MG PO TABS: 20.0000 mg | ORAL_TABLET | Freq: Every day | ORAL | Status: DC

## 2011-10-20 NOTE — Telephone Encounter (Signed)
Refill sent for atorvastatin 20 mg take one tablet daily at 6 pm.

## 2011-10-20 NOTE — Telephone Encounter (Signed)
Refill sent amiodarone 200 mg take one tablet daily.

## 2011-10-24 ENCOUNTER — Encounter: Payer: Self-pay | Admitting: Cardiovascular Disease

## 2011-10-24 ENCOUNTER — Telehealth: Payer: Self-pay | Admitting: Cardiovascular Disease

## 2011-10-24 DIAGNOSIS — Z951 Presence of aortocoronary bypass graft: Secondary | ICD-10-CM | POA: Diagnosis not present

## 2011-10-24 DIAGNOSIS — Z5189 Encounter for other specified aftercare: Secondary | ICD-10-CM | POA: Diagnosis not present

## 2011-10-24 NOTE — Telephone Encounter (Signed)
Error

## 2011-10-26 DIAGNOSIS — Z5189 Encounter for other specified aftercare: Secondary | ICD-10-CM | POA: Diagnosis not present

## 2011-10-26 DIAGNOSIS — Z951 Presence of aortocoronary bypass graft: Secondary | ICD-10-CM | POA: Diagnosis not present

## 2011-10-28 DIAGNOSIS — Z5189 Encounter for other specified aftercare: Secondary | ICD-10-CM | POA: Diagnosis not present

## 2011-10-28 DIAGNOSIS — Z951 Presence of aortocoronary bypass graft: Secondary | ICD-10-CM | POA: Diagnosis not present

## 2011-10-31 DIAGNOSIS — Z951 Presence of aortocoronary bypass graft: Secondary | ICD-10-CM | POA: Diagnosis not present

## 2011-10-31 DIAGNOSIS — Z5189 Encounter for other specified aftercare: Secondary | ICD-10-CM | POA: Diagnosis not present

## 2011-11-07 ENCOUNTER — Encounter: Payer: Self-pay | Admitting: Cardiovascular Disease

## 2011-11-07 ENCOUNTER — Ambulatory Visit (INDEPENDENT_AMBULATORY_CARE_PROVIDER_SITE_OTHER): Payer: Medicare Other | Admitting: Cardiovascular Disease

## 2011-11-07 VITALS — BP 126/62 | HR 66 | Ht 69.0 in | Wt 155.8 lb

## 2011-11-07 DIAGNOSIS — I2510 Atherosclerotic heart disease of native coronary artery without angina pectoris: Secondary | ICD-10-CM | POA: Diagnosis not present

## 2011-11-07 DIAGNOSIS — R0602 Shortness of breath: Secondary | ICD-10-CM

## 2011-11-07 DIAGNOSIS — F17200 Nicotine dependence, unspecified, uncomplicated: Secondary | ICD-10-CM

## 2011-11-07 DIAGNOSIS — I10 Essential (primary) hypertension: Secondary | ICD-10-CM

## 2011-11-07 DIAGNOSIS — I739 Peripheral vascular disease, unspecified: Secondary | ICD-10-CM | POA: Diagnosis not present

## 2011-11-07 NOTE — Assessment & Plan Note (Signed)
Blood pressures well controlled. We did discuss increasing his ACE inhibitor. We will wait given previous hypotension in the perioperative period.

## 2011-11-07 NOTE — Assessment & Plan Note (Signed)
Currently with no symptoms of angina. No further workup at this time. Continue current medication regimen. 

## 2011-11-07 NOTE — Assessment & Plan Note (Signed)
We have complemented him on his ability to stop smoking since his surgery

## 2011-11-07 NOTE — Patient Instructions (Addendum)
You are doing well. Decrease the aspirin to 81 mg x 2   check lipids when you are free, call the office to schedule  ECHO in 6 months before appt  Please call us if you have new issues that need to be addressed before your next appt.  Your physician wants you to follow-up in: 6 months.  You will receive a reminder letter in the mail two months in advance. If you don't receive a letter, please call our office to schedule the follow-up appointment.

## 2011-11-07 NOTE — Progress Notes (Signed)
Patient ID: [name], male    DOB: Jun 05, 1940, 71 y.o.   MRN: [id number]  HPI Comments: [name] is a pleasant 71 year old gentleman with a history of hyperlipidemia, long smoking history for 60 years, COPD , stent to his left lower extremity for claudication followed by Dr. Evette Cristal,   carotid showed 40% to 59% bilateral disease, subclavian disease on the left, who presents after cardiac catheterization showed severe left main disease, status post CABG x3 with LIMA to the LAD, vein graft to the PDA, vein graft to the OM. He had surgery on 07/10/2011.  He does have mild aortic valve stenosis with peak velocity 217 cm/s.  He reports that he has been doing very well since his last clinic visit. He has had no further episodes of COPD exacerbation. He does not feel that he has any fluid overload. No significant edema in his ankles there overall his shortness of breath is stable and he is complaining about taking spiriva as it is very expensive for him. He is uncertain if it is helping his breathing. He has completed [facility] rehabilitation and is now participating in the forever fit program. Blood pressure at home is typically 110-120 systolic.  He reports his stamina is not as good as he would like. EKG shows normal sinus rhythm with a rate 66 beats per minute with ST and T wave abnormality in lead V5 and V6, Leads 2, 3, aVF       Outpatient Encounter Prescriptions as of 11/07/2011  Medication Sig Dispense Refill  . aspirin EC 325 MG EC tablet Take 1 tablet (325 mg total) by mouth daily.  30 tablet    . atorvastatin (LIPITOR) 20 MG tablet Take 1 tablet (20 mg total) by mouth daily at 6 PM.  30 tablet  3  . carvedilol (COREG) 3.125 MG tablet Take 1 tablet (3.125 mg total) by mouth 2 (two) times daily with a meal.  60 tablet  3  . furosemide (LASIX) 20 MG tablet Take 20 mg by mouth daily as needed.      Marland Kitchen levothyroxine (SYNTHROID, LEVOTHROID) 200 MCG tablet Take 200 mcg by mouth daily. Take in addition  to 25 mcg every other day      . levothyroxine (SYNTHROID, LEVOTHROID) 25 MCG tablet Take 25 mcg by mouth every other day.      . lisinopril (PRINIVIL,ZESTRIL) 5 MG tablet Take 1 tablet (5 mg total) by mouth daily.  30 tablet  3  . potassium chloride (K-DUR) 10 MEQ tablet Take 10 mEq by mouth daily as needed.      . tiotropium (SPIRIVA HANDIHALER) 18 MCG inhalation capsule Place 1 capsule (18 mcg total) into inhaler and inhale daily.  30 capsule  3    Review of Systems  HENT: Negative.   Eyes: Negative.   Cardiovascular: Negative.   Gastrointestinal: Negative.   Musculoskeletal: Negative.   Skin: Negative.   Hematological: Negative.   Psychiatric/Behavioral: Negative.   All other systems reviewed and are negative.    BP 126/62  Pulse 66  Ht 5\' 9"  (1.753 m)  Wt 155 lb 12 oz (70.648 kg)  BMI 23.00 kg/m2  Physical Exam  Nursing note and vitals reviewed. Constitutional: He is oriented to person, place, and time. He appears well-developed and well-nourished.  HENT:  Head: Normocephalic.  Nose: Nose normal.  Mouth/Throat: Oropharynx is clear and moist.  Eyes: Conjunctivae are normal. Pupils are equal, round, and reactive to light.  Neck: Normal range of motion. Neck  supple. No JVD present.  Cardiovascular: Normal rate, regular rhythm, S1 normal, S2 normal, normal heart sounds and intact distal pulses.  Exam reveals no gallop and no friction rub.   No murmur heard.      Well-healed mediastinal incision site  Pulmonary/Chest: He has decreased breath sounds. He exhibits no tenderness.  Abdominal: Soft. Bowel sounds are normal. He exhibits no distension. There is no tenderness.  Musculoskeletal: Normal range of motion. He exhibits no edema and no tenderness.  Lymphadenopathy:    He has no cervical adenopathy.  Neurological: He is alert and oriented to person, place, and time. Coordination normal.  Skin: Skin is warm and dry. No rash noted. No erythema.  Psychiatric: He has a normal  mood and affect. His behavior is normal. Judgment and thought content normal.           Assessment and Plan

## 2011-11-07 NOTE — Assessment & Plan Note (Signed)
Mild symptoms, significantly improved. He can take Lasix when necessary. He has followup with Dr. Kendrick Fries for COPD.

## 2011-11-21 ENCOUNTER — Other Ambulatory Visit (INDEPENDENT_AMBULATORY_CARE_PROVIDER_SITE_OTHER): Payer: Medicare Other

## 2011-11-21 DIAGNOSIS — E039 Hypothyroidism, unspecified: Secondary | ICD-10-CM | POA: Diagnosis not present

## 2011-11-21 DIAGNOSIS — R0602 Shortness of breath: Secondary | ICD-10-CM

## 2011-11-21 DIAGNOSIS — I739 Peripheral vascular disease, unspecified: Secondary | ICD-10-CM

## 2011-11-21 DIAGNOSIS — E7800 Pure hypercholesterolemia, unspecified: Secondary | ICD-10-CM | POA: Diagnosis not present

## 2011-11-21 DIAGNOSIS — R972 Elevated prostate specific antigen [PSA]: Secondary | ICD-10-CM | POA: Diagnosis not present

## 2011-11-21 DIAGNOSIS — I2510 Atherosclerotic heart disease of native coronary artery without angina pectoris: Secondary | ICD-10-CM

## 2011-11-21 LAB — LIPID PANEL
HDL: 48.9 mg/dL (ref >39.00)
LDL Cholesterol: 39 mg/dL (ref 0–99)
Total CHOL/HDL Ratio: 2
Triglycerides: 106 mg/dL (ref 0.0–149.0)
VLDL: 21.2 mg/dL (ref 0.0–40.0)

## 2011-11-21 LAB — COMPREHENSIVE METABOLIC PANEL
ALT: 12 U/L (ref 0–53)
AST: 18 U/L (ref 0–37)
Alkaline Phosphatase: 64 U/L (ref 39–117)
CO2: 26 mEq/L (ref 19–32)
Creatinine, Ser: 1.8 mg/dL — ABNORMAL HIGH (ref 0.4–1.5)
GFR: 41.11 mL/min — ABNORMAL LOW (ref >60.00)
Sodium: 138 mEq/L (ref 135–145)
Total Bilirubin: 0.4 mg/dL (ref 0.3–1.2)

## 2012-01-10 ENCOUNTER — Other Ambulatory Visit: Payer: Medicare Other

## 2012-01-12 ENCOUNTER — Other Ambulatory Visit (INDEPENDENT_AMBULATORY_CARE_PROVIDER_SITE_OTHER): Payer: Medicare Other

## 2012-01-12 DIAGNOSIS — E039 Hypothyroidism, unspecified: Secondary | ICD-10-CM | POA: Diagnosis not present

## 2012-01-12 NOTE — Addendum Note (Signed)
Addended by: Baldomero Lamy on: 01/12/2012 09:51 AM   Modules accepted: Orders

## 2012-01-13 LAB — T3, FREE: T3, Free: 2.5 pg/mL (ref 2.3–4.2)

## 2012-01-20 ENCOUNTER — Other Ambulatory Visit: Payer: Self-pay

## 2012-01-20 MED ORDER — LEVOTHYROXINE SODIUM 200 MCG PO TABS: 200.0000 ug | ORAL_TABLET | Freq: Every day | ORAL | Status: DC

## 2012-01-20 NOTE — Telephone Encounter (Signed)
Rx Levothyroxine 200 mcg #30 11 R called in to [facility].

## 2012-01-20 NOTE — Telephone Encounter (Signed)
Refill request for Levothyroxine 200 mcg #30. Ok to refill?

## 2012-05-08 ENCOUNTER — Other Ambulatory Visit: Payer: Self-pay

## 2012-05-08 ENCOUNTER — Other Ambulatory Visit (INDEPENDENT_AMBULATORY_CARE_PROVIDER_SITE_OTHER): Payer: Medicare Other

## 2012-05-08 DIAGNOSIS — I359 Nonrheumatic aortic valve disorder, unspecified: Secondary | ICD-10-CM

## 2012-05-08 DIAGNOSIS — R0602 Shortness of breath: Secondary | ICD-10-CM

## 2012-05-08 DIAGNOSIS — I2510 Atherosclerotic heart disease of native coronary artery without angina pectoris: Secondary | ICD-10-CM

## 2012-05-26 DIAGNOSIS — I771 Stricture of artery: Secondary | ICD-10-CM

## 2012-05-26 HISTORY — PX: ANGIOPLASTY / STENTING FEMORAL: SUR30

## 2012-05-26 HISTORY — DX: Stricture of artery: I77.1

## 2012-05-26 HISTORY — PX: SUBCLAVIAN ARTERY STENT: SHX2452

## 2012-05-26 HISTORY — PX: CARDIAC CATHETERIZATION: SHX172

## 2012-05-31 ENCOUNTER — Encounter (INDEPENDENT_AMBULATORY_CARE_PROVIDER_SITE_OTHER): Payer: Medicare Other

## 2012-05-31 DIAGNOSIS — I6529 Occlusion and stenosis of unspecified carotid artery: Secondary | ICD-10-CM | POA: Diagnosis not present

## 2012-05-31 DIAGNOSIS — I771 Stricture of artery: Secondary | ICD-10-CM

## 2012-05-31 DIAGNOSIS — R0989 Other specified symptoms and signs involving the circulatory and respiratory systems: Secondary | ICD-10-CM

## 2012-06-01 ENCOUNTER — Encounter: Payer: Self-pay | Admitting: Cardiovascular Disease

## 2012-06-01 ENCOUNTER — Ambulatory Visit (INDEPENDENT_AMBULATORY_CARE_PROVIDER_SITE_OTHER): Payer: Medicare Other | Admitting: Cardiovascular Disease

## 2012-06-01 VITALS — BP 160/62 | HR 72 | Ht 69.0 in | Wt 169.8 lb

## 2012-06-01 DIAGNOSIS — I10 Essential (primary) hypertension: Secondary | ICD-10-CM

## 2012-06-01 DIAGNOSIS — I2510 Atherosclerotic heart disease of native coronary artery without angina pectoris: Secondary | ICD-10-CM | POA: Diagnosis not present

## 2012-06-01 DIAGNOSIS — I771 Stricture of artery: Secondary | ICD-10-CM | POA: Diagnosis not present

## 2012-06-01 NOTE — Assessment & Plan Note (Signed)
The patient has evidence of left subclavian artery stenosis or occlusion with significant worsening in symptoms over the last 6 months and a pressure gradient of 54 mmHg. He is highly symptomatic likely due to the fact that he is left-handed. The other concerning issue is that he has a LIMA to LAD which likely has compromised flow related to subclavian stenosis. The patient needs revascularization of the left subclavian artery given the severity of his symptoms and also to protect the LIMA graft. Due to that, I recommend proceeding with aortic arch angiogram, left subclavian artery angiography and possible endovascular intervention. Risks, benefits and alternatives were discussed with the patient. If the left subclavian artery is occluded, he might require surgical bypass. His left femoral artery pulse is better than the right side. I might consider access via the left femoral artery.

## 2012-06-01 NOTE — Patient Instructions (Signed)
Your physician has requested that you have a peripheral vascular angiogram. This exam is performed at the hospital. During this exam IV contrast is used to look at arterial blood flow. Please review the information sheet given for details.   

## 2012-06-01 NOTE — Progress Notes (Signed)
Primary care physician: Dr. Ermalene Searing. Primary cardiologist: Dr. Mariah Milling  HPI  Mr. Aycock is a pleasant 72 year old gentleman who was referred by Dr. Mariah Milling for evaluation and management of left subclavian stenosis. The patient has multiple medical problems including hyperlipidemia, long smoking history for 60 years, COPD , stent to his left lower extremity for claudication followed by Dr. Evette Cristal,   carotid showed 40% to 59% bilateral disease, and atherosclerotic coronary artery disease status post CABG in March of 2013 with LIMA to the LAD, vein graft to the PDA, vein graft to the OM. He does have mild aortic valve stenosis with peak velocity 217 cm/s. He is known to have left subclavian stenosis. His symptoms have worsened significantly over the last 6 months. He is left-handed. He reports significant discomfort with using the left hand associated with cramping and feeling cold in that side. This is significantly affecting his ability to perform activities of daily living. He had carotid duplex done yesterday which showed moderate bilateral ICA disease. There was Doppler evidence of left subclavian steel. There was a 54 mm pressure difference in the left brachial artery. The previous pressure gradient was 26 mm.  No Known Allergies   Current Outpatient Prescriptions on File Prior to Visit  Medication Sig Dispense Refill  . atorvastatin (LIPITOR) 20 MG tablet Take 1 tablet (20 mg total) by mouth daily at 6 PM.  30 tablet  3  . carvedilol (COREG) 3.125 MG tablet Take 1 tablet (3.125 mg total) by mouth 2 (two) times daily with a meal.  60 tablet  3  . furosemide (LASIX) 20 MG tablet Take 20 mg by mouth daily as needed.      Marland Kitchen levothyroxine (SYNTHROID, LEVOTHROID) 200 MCG tablet Take 200 mcg by mouth daily.      Marland Kitchen lisinopril (PRINIVIL,ZESTRIL) 5 MG tablet Take 1 tablet (5 mg total) by mouth daily.  30 tablet  3  . potassium chloride (K-DUR) 10 MEQ tablet Take 10 mEq by mouth daily as needed.      .  [DISCONTINUED] albuterol (PROVENTIL HFA;VENTOLIN HFA) 108 (90 BASE) MCG/ACT inhaler Inhale 2 puffs into the lungs every 6 (six) hours as needed for wheezing.  3.7 g  1  . [DISCONTINUED] Fluticasone-Salmeterol (ADVAIR DISKUS) 250-50 MCG/DOSE AEPB Inhale 1 puff into the lungs 2 (two) times daily.  1 each  11  . [DISCONTINUED] pravastatin (PRAVACHOL) 40 MG tablet Take 1 tablet (40 mg total) by mouth daily.  30 tablet  6     Past Medical History  Diagnosis Date  . Thyroid disease     hypo  . Venous insufficiency   . Peripheral arterial disease   . Tobacco abuse   . COPD (chronic obstructive pulmonary disease)   . Arthritis   . Hypothyroidism      Past Surgical History  Procedure Date  . Cholecystectomy   . Angioplasty / stenting femoral   . Coronary artery bypass graft 07/11/2011    Procedure: CORONARY ARTERY BYPASS GRAFTING (CABG);  Surgeon: [name], MD;  [facility];  Service: Open Heart Surgery;  Laterality: N/A;  Times 3. On Pump. Using right greater saphenous vein and left internal mammary artery.      Family History  Problem Relation Age of Onset  . Alcohol abuse Father   . Cirrhosis Father   . Hypothyroidism Sister      History   Social History  . Marital Status: Married    Spouse Name: N/A    Number  of Children: 2  . Years of Education: N/A   Occupational History  . Retired     Comptroller   Social History Main Topics  . Smoking status: Former Smoker -- 1.0 packs/day for 50 years    Types: Cigarettes    Quit date: 04/27/2011  . Smokeless tobacco: Never Used  . Alcohol Use: No  . Drug Use: No  . Sexually Active: Not Currently   Other Topics Concern  . Not on file   Social History Narrative   Exercsiing 3 times a week. Moderate diet control. O living will, no HCPOA.     ROS Constitutional: Negative for fever, chills, diaphoresis, activity change, appetite change and fatigue.  HENT: Negative for hearing loss, nosebleeds,  congestion, sore throat, facial swelling, drooling, trouble swallowing, neck pain, voice change, sinus pressure and tinnitus.  Eyes: Negative for photophobia, pain, discharge and visual disturbance.  Respiratory: Negative for apnea, cough, chest tightness  and wheezing.  Cardiovascular: Negative for chest pain, palpitations and leg swelling.  Gastrointestinal: Negative for nausea, vomiting, abdominal pain, diarrhea, constipation, blood in stool and abdominal distention.  Genitourinary: Negative for dysuria, urgency, frequency, hematuria and decreased urine volume.  Skin: Negative for color change, pallor, rash and wound.  Neurological: Negative for dizziness, tremors, seizures, syncope, speech difficulty, weakness, light-headedness, numbness and headaches.  Psychiatric/Behavioral: Negative for suicidal ideas, hallucinations, behavioral problems and agitation. The patient is not nervous/anxious.     PHYSICAL EXAM   BP 160/62  Pulse 72  Ht 5\' 9"  (1.753 m)  Wt 169 lb 12 oz (76.998 kg)  BMI 25.07 kg/m2 Constitutional: He is oriented to person, place, and time. He appears well-developed and well-nourished. No distress.  HENT: No nasal discharge.  Head: Normocephalic and atraumatic.  Eyes: Pupils are equal and round. Right eye exhibits no discharge. Left eye exhibits no discharge.  Neck: Normal range of motion. Neck supple. No JVD present. No thyromegaly present. There is left carotid bruit. There is a bruit in the left subclavian artery area. Cardiovascular: Normal rate, regular rhythm, normal heart sounds and. Exam reveals no gallop and no friction rub. There is 2/6 systolic ejection murmur at the aortic area.  Pulmonary/Chest: Effort normal and breath sounds normal. No stridor. No respiratory distress. He has no wheezes. He has no rales. He exhibits no tenderness.  Abdominal: Soft. Bowel sounds are normal. He exhibits no distension. There is no tenderness. There is no rebound and no guarding.   Musculoskeletal: Normal range of motion. He exhibits no edema and no tenderness.  Neurological: He is alert and oriented to person, place, and time. Coordination normal.  Skin: Skin is warm and dry. No rash noted. He is not diaphoretic. No erythema. No pallor.  Psychiatric: He has a normal mood and affect. His behavior is normal. Judgment and thought content normal.  Vascular: Right radial pulse is normal. Left radial pulse is absent. Femoral pulse is: +1 on the right side and +2 on the left side. Distal pulses are diminished.     EKG: Sinus  Rhythm  -  Nonspecific T-abnormality.   ABNORMAL    ASSESSMENT AND PLAN \

## 2012-06-01 NOTE — Assessment & Plan Note (Signed)
No symptoms of angina. Continue medical therapy. 

## 2012-06-02 LAB — CBC WITH DIFFERENTIAL
Basophils Absolute: 0.1 10*3/uL (ref 0.0–0.2)
Eosinophils Absolute: 0.3 10*3/uL (ref 0.0–0.4)
HCT: 47.5 % (ref 37.5–51.0)
Immature Grans (Abs): 0 10*3/uL (ref 0.0–0.1)
Immature Granulocytes: 0 % (ref 0–2)
Lymphs: 34 % (ref 14–46)
MCHC: 33.7 g/dL (ref 31.5–35.7)
Monocytes: 12 % (ref 4–12)
Neutrophils Absolute: 3.3 10*3/uL (ref 1.4–7.0)
Neutrophils Relative %: 49 % (ref 40–74)
Platelets: 233 10*3/uL (ref 155–379)
RDW: 13.6 % (ref 12.3–15.4)
WBC: 6.7 10*3/uL (ref 3.4–10.8)

## 2012-06-02 LAB — PROTIME-INR
INR: 1.2 (ref 0.8–1.2)
Prothrombin Time: 12.5 s — ABNORMAL HIGH (ref 9.1–12.0)

## 2012-06-02 LAB — BASIC METABOLIC PANEL
BUN/Creatinine Ratio: 20 (ref 10–22)
CO2: 20 mmol/L (ref 19–28)
Calcium: 9.8 mg/dL (ref 8.6–10.2)
Chloride: 107 mmol/L (ref 97–108)
Creatinine, Ser: 1.59 mg/dL — ABNORMAL HIGH (ref 0.76–1.27)
Potassium: 5.5 mmol/L — ABNORMAL HIGH (ref 3.5–5.2)
Sodium: 144 mmol/L (ref 134–144)

## 2012-06-04 ENCOUNTER — Ambulatory Visit: Payer: Medicare Other | Admitting: Cardiovascular Disease

## 2012-06-11 ENCOUNTER — Encounter (HOSPITAL_COMMUNITY): Payer: Self-pay | Admitting: Pharmacy Technician

## 2012-06-15 ENCOUNTER — Other Ambulatory Visit: Payer: Self-pay | Admitting: Cardiovascular Disease

## 2012-06-20 ENCOUNTER — Encounter (HOSPITAL_COMMUNITY)
Admission: RE | Disposition: A | Discharge status: Home / Self Care | Payer: Self-pay | Source: Ambulatory Visit | Attending: Cardiovascular Disease

## 2012-06-20 ENCOUNTER — Ambulatory Visit (HOSPITAL_COMMUNITY)
Admission: RE | Admit: 2012-06-20 | Discharge: 2012-06-20 | Disposition: A | Discharge status: Home / Self Care | Payer: Medicare Other | Source: Ambulatory Visit | Attending: Cardiovascular Disease | Admitting: Cardiovascular Disease

## 2012-06-20 ENCOUNTER — Other Ambulatory Visit: Payer: Self-pay | Admitting: Cardiovascular Disease

## 2012-06-20 DIAGNOSIS — I771 Stricture of artery: Secondary | ICD-10-CM | POA: Diagnosis not present

## 2012-06-20 DIAGNOSIS — J449 Chronic obstructive pulmonary disease, unspecified: Secondary | ICD-10-CM | POA: Diagnosis not present

## 2012-06-20 DIAGNOSIS — Z951 Presence of aortocoronary bypass graft: Secondary | ICD-10-CM | POA: Diagnosis not present

## 2012-06-20 DIAGNOSIS — E785 Hyperlipidemia, unspecified: Secondary | ICD-10-CM | POA: Diagnosis not present

## 2012-06-20 DIAGNOSIS — F17200 Nicotine dependence, unspecified, uncomplicated: Secondary | ICD-10-CM | POA: Insufficient documentation

## 2012-06-20 DIAGNOSIS — J4489 Other specified chronic obstructive pulmonary disease: Secondary | ICD-10-CM | POA: Insufficient documentation

## 2012-06-20 DIAGNOSIS — I359 Nonrheumatic aortic valve disorder, unspecified: Secondary | ICD-10-CM | POA: Diagnosis not present

## 2012-06-20 DIAGNOSIS — I708 Atherosclerosis of other arteries: Secondary | ICD-10-CM | POA: Insufficient documentation

## 2012-06-20 HISTORY — PX: ARCH AORTOGRAM: SHX5501

## 2012-06-20 HISTORY — PX: PERCUTANEOUS STENT INTERVENTION: SHX5500

## 2012-06-20 LAB — POCT ACTIVATED CLOTTING TIME
Activated Clotting Time: 263 seconds
Activated Clotting Time: 274 seconds
Activated Clotting Time: 225 seconds
Activated Clotting Time: 192 seconds

## 2012-06-20 LAB — CBC
Hemoglobin: 16.2 g/dL (ref 13.0–17.0)
MCV: 88.2 fL (ref 78.0–100.0)
Platelets: 192 10*3/uL (ref 150–400)
RBC: 5.58 MIL/uL (ref 4.22–5.81)
WBC: 8.2 10*3/uL (ref 4.0–10.5)

## 2012-06-20 LAB — BASIC METABOLIC PANEL
CO2: 21 mEq/L (ref 19–32)
Calcium: 9.7 mg/dL (ref 8.4–10.5)
Chloride: 105 mEq/L (ref 96–112)
Glucose, Bld: 100 mg/dL — ABNORMAL HIGH (ref 70–99)
Potassium: 4.7 mEq/L (ref 3.5–5.1)
Sodium: 139 mEq/L (ref 135–145)

## 2012-06-20 SURGERY — ARCH AORTOGRAM
Anesthesia: LOCAL

## 2012-06-20 MED ORDER — LIDOCAINE HCL (PF) 1 % IJ SOLN
INTRAMUSCULAR | Status: AC
  Filled 2012-06-20: qty 30

## 2012-06-20 MED ORDER — FENTANYL CITRATE 0.05 MG/ML IJ SOLN
INTRAMUSCULAR | Status: AC
  Filled 2012-06-20: qty 2

## 2012-06-20 MED ORDER — ONDANSETRON HCL 4 MG/2ML IJ SOLN: 4.0000 mg | Freq: Four times a day (QID) | INTRAMUSCULAR | Status: DC | PRN

## 2012-06-20 MED ORDER — LABETALOL HCL 5 MG/ML IV SOLN
20.0000 mg | Freq: Once | INTRAVENOUS | Status: AC
  Administered 2012-06-20: 20 mg via INTRAVENOUS

## 2012-06-20 MED ORDER — SODIUM CHLORIDE 0.9 % IJ SOLN: 3.0000 mL | Freq: Two times a day (BID) | INTRAMUSCULAR | Status: DC

## 2012-06-20 MED ORDER — SODIUM CHLORIDE 0.9 % IV SOLN
INTRAVENOUS | Status: DC
  Administered 2012-06-20: 15:00:00 via INTRAVENOUS

## 2012-06-20 MED ORDER — CLOPIDOGREL BISULFATE 75 MG PO TABS: 75.0000 mg | ORAL_TABLET | Freq: Every day | ORAL | Status: DC

## 2012-06-20 MED ORDER — ASPIRIN 81 MG PO CHEW
324.0000 mg | CHEWABLE_TABLET | ORAL | Status: AC
  Administered 2012-06-20: 324 mg via ORAL

## 2012-06-20 MED ORDER — HEPARIN SODIUM (PORCINE) 1000 UNIT/ML IJ SOLN
INTRAMUSCULAR | Status: AC
  Filled 2012-06-20: qty 1

## 2012-06-20 MED ORDER — CLOPIDOGREL BISULFATE 300 MG PO TABS
ORAL_TABLET | ORAL | Status: AC
  Filled 2012-06-20: qty 2

## 2012-06-20 MED ORDER — MIDAZOLAM HCL 2 MG/2ML IJ SOLN
INTRAMUSCULAR | Status: AC
  Filled 2012-06-20: qty 2

## 2012-06-20 MED ORDER — ACETAMINOPHEN 325 MG PO TABS: 650.0000 mg | ORAL_TABLET | ORAL | Status: DC | PRN

## 2012-06-20 MED ORDER — SODIUM CHLORIDE 0.9 % IJ SOLN: 3.0000 mL | INTRAMUSCULAR | Status: DC | PRN

## 2012-06-20 MED ORDER — ASPIRIN 81 MG PO CHEW
CHEWABLE_TABLET | ORAL | Status: AC
  Filled 2012-06-20: qty 4

## 2012-06-20 MED ORDER — SODIUM CHLORIDE 0.9 % IV SOLN
INTRAVENOUS | Status: DC
  Administered 2012-06-20: 07:00:00 via INTRAVENOUS

## 2012-06-20 MED ORDER — HEPARIN (PORCINE) IN NACL 2-0.9 UNIT/ML-% IJ SOLN
INTRAMUSCULAR | Status: AC
  Filled 2012-06-20: qty 2000

## 2012-06-20 MED ORDER — SODIUM CHLORIDE 0.9 % IV SOLN: 250.0000 mL | INTRAVENOUS | Status: DC | PRN

## 2012-06-20 NOTE — Interval H&P Note (Signed)
History and Physical Interval Note:  06/20/2012 9:02 AM  [name]  has presented today for surgery, with the diagnosis of pvd  The various methods of treatment have been discussed with the patient and family. After consideration of risks, benefits and other options for treatment, the patient has consented to  Procedure(s): ARCH AORTOGRAM (N/A) as a surgical intervention .  The patient's history has been reviewed, patient examined, no change in status, stable for surgery.  I have reviewed the patient's chart and labs.  Questions were answered to the patient's satisfaction.     Lorine Bears

## 2012-06-20 NOTE — H&P (View-Only) (Signed)
 Primary care physician: Dr. Bedsole. Primary cardiologist: Dr. Gollan  HPI  [name] is a pleasant 72-year-old gentleman who was referred by Dr. Gollan for evaluation and management of left subclavian stenosis. The patient has multiple medical problems including hyperlipidemia, long smoking history for 60 years, COPD , stent to his left lower extremity for claudication followed by Dr. Sankar,   carotid showed 40% to 59% bilateral disease, and atherosclerotic coronary artery disease status post CABG in March of 2013 with LIMA to the LAD, vein graft to the PDA, vein graft to the OM. He does have mild aortic valve stenosis with peak velocity 217 cm/s. He is known to have left subclavian stenosis. His symptoms have worsened significantly over the last 6 months. He is left-handed. He reports significant discomfort with using the left hand associated with cramping and feeling cold in that side. This is significantly affecting his ability to perform activities of daily living. He had carotid duplex done yesterday which showed moderate bilateral ICA disease. There was Doppler evidence of left subclavian steel. There was a 54 mm pressure difference in the left brachial artery. The previous pressure gradient was 26 mm.  No Known Allergies   Current Outpatient Prescriptions on File Prior to Visit  Medication Sig Dispense Refill  . atorvastatin (LIPITOR) 20 MG tablet Take 1 tablet (20 mg total) by mouth daily at 6 PM.  30 tablet  3  . carvedilol (COREG) 3.125 MG tablet Take 1 tablet (3.125 mg total) by mouth 2 (two) times daily with a meal.  60 tablet  3  . furosemide (LASIX) 20 MG tablet Take 20 mg by mouth daily as needed.      . levothyroxine (SYNTHROID, LEVOTHROID) 200 MCG tablet Take 200 mcg by mouth daily.      . lisinopril (PRINIVIL,ZESTRIL) 5 MG tablet Take 1 tablet (5 mg total) by mouth daily.  30 tablet  3  . potassium chloride (K-DUR) 10 MEQ tablet Take 10 mEq by mouth daily as needed.      .  [DISCONTINUED] albuterol (PROVENTIL HFA;VENTOLIN HFA) 108 (90 BASE) MCG/ACT inhaler Inhale 2 puffs into the lungs every 6 (six) hours as needed for wheezing.  3.7 g  1  . [DISCONTINUED] Fluticasone-Salmeterol (ADVAIR DISKUS) 250-50 MCG/DOSE AEPB Inhale 1 puff into the lungs 2 (two) times daily.  1 each  11  . [DISCONTINUED] pravastatin (PRAVACHOL) 40 MG tablet Take 1 tablet (40 mg total) by mouth daily.  30 tablet  6     Past Medical History  Diagnosis Date  . Thyroid disease     hypo  . Venous insufficiency   . Peripheral arterial disease   . Tobacco abuse   . COPD (chronic obstructive pulmonary disease)   . Arthritis   . Hypothyroidism      Past Surgical History  Procedure Date  . Cholecystectomy   . Angioplasty / stenting femoral   . Coronary artery bypass graft 07/11/2011    Procedure: CORONARY ARTERY BYPASS GRAFTING (CABG);  Surgeon: Edward B Gerhardt, MD;  Location: [facility] OR;  Service: Open Heart Surgery;  Laterality: N/A;  Times 3. On Pump. Using right greater saphenous vein and left internal mammary artery.      Family History  Problem Relation Age of Onset  . Alcohol abuse Father   . Cirrhosis Father   . Hypothyroidism Sister      History   Social History  . Marital Status: Married    Spouse Name: N/A    Number   of Children: 2  . Years of Education: N/A   Occupational History  . Retired     mechanical engineer   Social History Main Topics  . Smoking status: Former Smoker -- 1.0 packs/day for 50 years    Types: Cigarettes    Quit date: 04/27/2011  . Smokeless tobacco: Never Used  . Alcohol Use: No  . Drug Use: No  . Sexually Active: Not Currently   Other Topics Concern  . Not on file   Social History Narrative   Exercsiing 3 times a week. Moderate diet control. O living will, no HCPOA.     ROS Constitutional: Negative for fever, chills, diaphoresis, activity change, appetite change and fatigue.  HENT: Negative for hearing loss, nosebleeds,  congestion, sore throat, facial swelling, drooling, trouble swallowing, neck pain, voice change, sinus pressure and tinnitus.  Eyes: Negative for photophobia, pain, discharge and visual disturbance.  Respiratory: Negative for apnea, cough, chest tightness  and wheezing.  Cardiovascular: Negative for chest pain, palpitations and leg swelling.  Gastrointestinal: Negative for nausea, vomiting, abdominal pain, diarrhea, constipation, blood in stool and abdominal distention.  Genitourinary: Negative for dysuria, urgency, frequency, hematuria and decreased urine volume.  Skin: Negative for color change, pallor, rash and wound.  Neurological: Negative for dizziness, tremors, seizures, syncope, speech difficulty, weakness, light-headedness, numbness and headaches.  Psychiatric/Behavioral: Negative for suicidal ideas, hallucinations, behavioral problems and agitation. The patient is not nervous/anxious.     PHYSICAL EXAM   BP 160/62  Pulse 72  Ht 5' 9" (1.753 m)  Wt 169 lb 12 oz (76.998 kg)  BMI 25.07 kg/m2 Constitutional: He is oriented to person, place, and time. He appears well-developed and well-nourished. No distress.  HENT: No nasal discharge.  Head: Normocephalic and atraumatic.  Eyes: Pupils are equal and round. Right eye exhibits no discharge. Left eye exhibits no discharge.  Neck: Normal range of motion. Neck supple. No JVD present. No thyromegaly present. There is left carotid bruit. There is a bruit in the left subclavian artery area. Cardiovascular: Normal rate, regular rhythm, normal heart sounds and. Exam reveals no gallop and no friction rub. There is 2/6 systolic ejection murmur at the aortic area.  Pulmonary/Chest: Effort normal and breath sounds normal. No stridor. No respiratory distress. He has no wheezes. He has no rales. He exhibits no tenderness.  Abdominal: Soft. Bowel sounds are normal. He exhibits no distension. There is no tenderness. There is no rebound and no guarding.   Musculoskeletal: Normal range of motion. He exhibits no edema and no tenderness.  Neurological: He is alert and oriented to person, place, and time. Coordination normal.  Skin: Skin is warm and dry. No rash noted. He is not diaphoretic. No erythema. No pallor.  Psychiatric: He has a normal mood and affect. His behavior is normal. Judgment and thought content normal.  Vascular: Right radial pulse is normal. Left radial pulse is absent. Femoral pulse is: +1 on the right side and +2 on the left side. Distal pulses are diminished.     EKG: Sinus  Rhythm  -  Nonspecific T-abnormality.   ABNORMAL    ASSESSMENT AND PLAN \ 

## 2012-06-20 NOTE — CV Procedure (Signed)
     PERIPHERAL VASCULAR PROCEDURE  NAME:  [name]   MRN: [id number] DOB:  10/21/1940   ADMIT DATE: 06/20/2012  Performing Cardiologist: Lorine Bears Primary Physician: Kerby Nora, MD Primary Cardiologist:  Dr. Mariah Milling and  Procedures Performed:  Aortic arch angiogram.    selective left subclavian artery angiography.  Angioplasty of the left axillary artery with self-expanding stent placement.  Angioplasty of the left subclavian artery with balloon expandable stent placement.  Indication(s):   severe claudication of the left arm with evidence of left subclavian steel on Doppler with compromised flow to the LIMA graft.   Consent: The procedure with Risks/Benefits/Alternatives and Indications was reviewed with the patient .  All questions were answered.  Medications:  Sedation:  2 mg IV Versed, 75 mcg IV Fentanyl  Contrast:  95 mL of Visipaque  Procedural details: The left groin was prepped, draped, and anesthetized with 1% lidocaine. Using modified Seldinger technique, a 5 French sheath was introduced into the left common femoral artery. A 5 Fr Pigtail Catheter was advanced of over a Versicore wire into the ascending Aorta. Aortic arch angiography was done in the LAO 40 position. The presence of severe left subclavian and left axillary artery stenosis. I then used a JR 4 diagnostic catheter to selectively engage the left subclavian artery. Selective angiography was done in the AP position.  Interventional Procedure:  The JR 4 diagnostic catheter engageg the left subclavian coronary artery. I used along the versicore wire to cross the lesion in the left subclavian and axillary artery. The catheter was then removed as well as the diagnostic sheath. I placed a 90 cm long 7 [place] shuttle sheath which was advanced to the ostium of the left subclavian artery. 5000 units of unfractionated heparin was given. Both lesions in the axillary artery and subclavian were predilated with a 4 x  20 mm balloon. There was significant recoil in both lesions. I then placed an 8 x 40 mm EPIC self-expanding stent in the left axillary artery lesion. The stent was postdilated with a 7 mm balloon. I then placed a 9 x 25 mm express LD balloon expandable stent in the ostial left subclavian artery. Angiography showed excellent results. The wire was then removed. The sheath was removed and exchanged into a 7 [place] sheath. The patient tolerated the procedure well. He did have significant discomfort in the left shoulder upon inflation of the left subclavian stent which resolved. He was given 600 mg of Plavix. He was noted to have a strong left radial pulse at the end of the case.  Hemodynamics:  Central Aortic Pressure / Mean Aortic Pressure: 152/63  Findings: Aortic arch: The right innominate artery is patent with mild atherosclerosis. There is mild 20-30% proximal stenosis in the right subclavian artery. Left carotid artery: Patent with possible 60% ostial stenosis which was difficult to visualize. Left subclavian artery: There is a 90% stenosis at the ostium. LIMA and vertebral arteries are patent. Left axillary artery: There is a 90% stenosis.  Conclusions: 1. severe ostial left subclavian artery stenosis as well as left axillary artery stenosis. 2. Possible ostial left carotid artery stenosis. 3. Successful angioplasty and self expandable stent placement to the left axillary artery as well as angioplasty and balloon expandable stent placement to the left subclavian artery stenosis.  Recommendations:  Recommend dual antiplatelet therapy for at least one month.   Lorine Bears, MD, [facility] 06/20/2012 10:28 AM

## 2012-06-21 ENCOUNTER — Other Ambulatory Visit: Payer: Self-pay | Admitting: *Deleted

## 2012-06-21 MED ORDER — CLOPIDOGREL BISULFATE 75 MG PO TABS: 75.0000 mg | ORAL_TABLET | Freq: Every day | ORAL | Status: DC

## 2012-06-21 NOTE — Telephone Encounter (Signed)
Refilled sent to [facility] pharmacy for plavix.

## 2012-06-23 ENCOUNTER — Telehealth: Payer: Self-pay | Admitting: Adult Health

## 2012-06-23 NOTE — Telephone Encounter (Signed)
Patient had cardiac cath on 06/20/12 with angioplasty and self expandable stent placement to the left axillary artery as well as angioplasty and balloon expandable stent placement to the left subclavian artery stenosis., per Dr. Kirke Corin. Wife concerned about purple bruising noted at the cath site, extending down the thigh for about "7 inches." No edema, bleeding or "goose egg" at the insertion site. He has no pain. She states his extremity is warm, without numbness or blanching.   I have asked her to continue to watch this, as he is recently placed on DAPT. Warm compresses should help, but explained they will make dissipation of subcutaneous bruising spread, but not worsen. If he begins to have symptoms of pain or active swelling at the site they are to seek treatment in [place] [facility]. She verbalized understanding.

## 2012-06-24 NOTE — Telephone Encounter (Signed)
Call him Monday to make sure things are stable.

## 2012-06-25 NOTE — Telephone Encounter (Signed)
I called to assess pt's bruising  He says bruising has gotten no worse; arm and hand are both warm and feels fine His main concern is the constant dull "ache" he has had in center of his chest x 2 days Denies worsening SOB with this He asks if this is normal post procedure I told him I would discuss with Dr. Kirke Corin as soon as he comes in this am and call pt back Understanding verb He is in no acute distress

## 2012-06-25 NOTE — Telephone Encounter (Signed)
I spoke with Dr. Kirke Corin about symptoms He asks that I call to make sure pts pain is not worse with exertion. If no worse with exertion, he feels this may be secondary to having to "stretch subclavian artery" during procedure  I spoke with pt who says pain is worse when lying down, better with exertion  Discussed with Dr. Kirke Corin who says ok to take Tylenol PRN pain and keep f/u with [place] 3/10 He will call us should pain get worse or change

## 2012-07-02 ENCOUNTER — Encounter: Payer: Self-pay | Admitting: Cardiovascular Disease

## 2012-07-02 ENCOUNTER — Ambulatory Visit (INDEPENDENT_AMBULATORY_CARE_PROVIDER_SITE_OTHER): Payer: Medicare Other | Admitting: Cardiovascular Disease

## 2012-07-02 VITALS — BP 138/60 | HR 72 | Ht 69.0 in | Wt 171.0 lb

## 2012-07-02 DIAGNOSIS — I739 Peripheral vascular disease, unspecified: Secondary | ICD-10-CM

## 2012-07-02 DIAGNOSIS — I2510 Atherosclerotic heart disease of native coronary artery without angina pectoris: Secondary | ICD-10-CM

## 2012-07-02 DIAGNOSIS — I771 Stricture of artery: Secondary | ICD-10-CM | POA: Diagnosis not present

## 2012-07-02 DIAGNOSIS — R0789 Other chest pain: Secondary | ICD-10-CM

## 2012-07-02 DIAGNOSIS — I658 Occlusion and stenosis of other precerebral arteries: Secondary | ICD-10-CM

## 2012-07-02 DIAGNOSIS — I6523 Occlusion and stenosis of bilateral carotid arteries: Secondary | ICD-10-CM

## 2012-07-02 MED ORDER — ASPIRIN 81 MG PO TABS: 81.0000 mg | ORAL_TABLET | Freq: Every day | ORAL | Status: DC

## 2012-07-02 NOTE — Patient Instructions (Addendum)
Decrease Aspirin to 81 mg once daily.   Your physician has requested that you have en exercise stress myoview. For further information please visit [email or web address] Please follow instruction sheet, as given.  Schedule carotid Doppler in 2 months.   Follow up in 3 months.

## 2012-07-02 NOTE — Progress Notes (Signed)
Primary care physician: Dr. Ermalene Searing. Primary cardiologist: Dr. Mariah Milling  HPI  [name] is a pleasant 72 year old gentleman who is here today for a followup visit after recent stenting of the left subclavian artery and left axillary artery.   The patient has multiple medical problems including hyperlipidemia, long smoking history for 60 years, COPD , stent to his left lower extremity for claudication done years ago by Dr. Evette Cristal,   carotid showed 40% to 59% bilateral disease, and atherosclerotic coronary artery disease status post CABG in March of 2013 with LIMA to the LAD, vein graft to the PDA, vein graft to the OM. He does have mild aortic valve stenosis with peak velocity 217 cm/s. He was seen recently for worsening left subclavian artery stenosis with severe left arm claudication with a 54 mm pressure difference in both arms. I performed aortic arch angiography which showed severe ostial left subclavian artery stenosis and severe left proximal axillary artery stenosis. He underwent successful self-expanding stent placement to the axillary artery balloon expandable stent to the ostial subclavian artery. There was possibly ostial left carotid artery stenosis as well. He did have a significant ecchymosis after the sheath pull but that seems to be improving. His left arm discomfort resolved. He did have localized chest pain in the sides of the left subclavian artery for a few days which resolved. However, he is complaining of increased fatigue and exertional dyspnea associated with dizziness. He feels similar to how he felt before his CABG.   No Known Allergies   Current Outpatient Prescriptions on File Prior to Visit  Medication Sig Dispense Refill  . atorvastatin (LIPITOR) 20 MG tablet Take 1 tablet (20 mg total) by mouth daily at 6 PM.  30 tablet  3  . carvedilol (COREG) 3.125 MG tablet Take 1 tablet (3.125 mg total) by mouth 2 (two) times daily with a meal.  60 tablet  3  . clopidogrel  (PLAVIX) 75 MG tablet Take 1 tablet (75 mg total) by mouth daily.  30 tablet  3  . levothyroxine (SYNTHROID, LEVOTHROID) 200 MCG tablet Take 200 mcg by mouth daily.      Marland Kitchen lisinopril (PRINIVIL,ZESTRIL) 5 MG tablet Take 1 tablet (5 mg total) by mouth daily.  30 tablet  3  . [DISCONTINUED] albuterol (PROVENTIL HFA;VENTOLIN HFA) 108 (90 BASE) MCG/ACT inhaler Inhale 2 puffs into the lungs every 6 (six) hours as needed for wheezing.  3.7 g  1  . [DISCONTINUED] Fluticasone-Salmeterol (ADVAIR DISKUS) 250-50 MCG/DOSE AEPB Inhale 1 puff into the lungs 2 (two) times daily.  1 each  11  . [DISCONTINUED] pravastatin (PRAVACHOL) 40 MG tablet Take 1 tablet (40 mg total) by mouth daily.  30 tablet  6   No current facility-administered medications on file prior to visit.     Past Medical History  Diagnosis Date  . Thyroid disease     hypo  . Venous insufficiency   . Peripheral arterial disease     Previous left lower extremity stenting by Dr. Evette Cristal  . Tobacco abuse   . COPD (chronic obstructive pulmonary disease)   . Arthritis   . Hypothyroidism   . Subclavian artery stenosis, left 05/2012    Status post stenting of the ostium and self-expanding stent placement to the left axillary artery     Past Surgical History  Procedure Laterality Date  . Cholecystectomy    . Angioplasty / stenting femoral    . Coronary artery bypass graft  07/11/2011    Procedure:  CORONARY ARTERY BYPASS GRAFTING (CABG);  Surgeon: [name], MD;  Location: [facility] OR;  Service: Open Heart Surgery;  Laterality: N/A;  Times 3. On Pump. Using right greater saphenous vein and left internal mammary artery.   . Cardiac catheterization  2/14    [facility]  . Subclavian artery stent  2014    x2     Family History  Problem Relation Age of Onset  . Alcohol abuse Father   . Cirrhosis Father   . Hypothyroidism Sister      History   Social History  . Marital Status: Married    Spouse Name: N/A    Number of Children: 2  .  Years of Education: N/A   Occupational History  . Retired     Comptroller   Social History Main Topics  . Smoking status: Former Smoker -- 1.00 packs/day for 50 years    Types: Cigarettes    Quit date: 04/27/2011  . Smokeless tobacco: Never Used  . Alcohol Use: No  . Drug Use: No  . Sexually Active: Not Currently   Other Topics Concern  . Not on file   Social History Narrative   Exercsiing 3 times a week.    Moderate diet control.    O living will, no HCPOA.      PHYSICAL EXAM   BP 138/60  Pulse 72  Ht 5\' 9"  (1.753 m)  Wt 171 lb (77.565 kg)  BMI 25.24 kg/m2 Constitutional: He is oriented to person, place, and time. He appears well-developed and well-nourished. No distress.  HENT: No nasal discharge.  Head: Normocephalic and atraumatic.  Eyes: Pupils are equal and round. Right eye exhibits no discharge. Left eye exhibits no discharge.  Neck: Normal range of motion. Neck supple. No JVD present. No thyromegaly present. There is left carotid bruit. There is a bruit in the left subclavian artery area. Cardiovascular: Normal rate, regular rhythm, normal heart sounds and. Exam reveals no gallop and no friction rub. There is 2/6 systolic ejection murmur at the aortic area.  Pulmonary/Chest: Effort normal and breath sounds normal. No stridor. No respiratory distress. He has no wheezes. He has no rales. He exhibits no tenderness.  Abdominal: Soft. Bowel sounds are normal. He exhibits no distension. There is no tenderness. There is no rebound and no guarding.  Musculoskeletal: Normal range of motion. He exhibits no edema and no tenderness.  Neurological: He is alert and oriented to person, place, and time. Coordination normal.  Skin: Skin is warm and dry. No rash noted. He is not diaphoretic. No erythema. No pallor.  Psychiatric: He has a normal mood and affect. His behavior is normal. Judgment and thought content normal.  Vascular: Right radial pulse is normal. Left radial  pulse is normal. Femoral pulse is: +1 on the right side and +2 on the left side. Distal pulses are diminished. That is ecchymosis in the left groin but no hematoma or tenderness. There is a faint bruit but there is also a bruit on the right side.     EKG: Sinus  Rhythm  -Nonspecific ST depression   +   Nonspecific T-abnormality  -Nondiagnostic.   ABNORMAL    ASSESSMENT AND PLAN \

## 2012-07-02 NOTE — Assessment & Plan Note (Signed)
He is having worsening exertional dyspnea and fatigue with more pronounced ST changes. This is somewhat surprising given that his LIMA flow is now better. His vein grafts were probably not the best quality due to previous venous thermal ablation.  I will request a treadmill nuclear stress test for evaluation.

## 2012-07-02 NOTE — Assessment & Plan Note (Signed)
The patient will need to have an ABI and lower extremity arterial duplex for followup. He is having worsening calf claudication on both sides. His right femoral pulse is slightly diminished as well. He prefers to wait before or during this period

## 2012-07-02 NOTE — Assessment & Plan Note (Signed)
Status post successful stenting of the ostial left subclavian artery and the proximal axillary artery. He is having significant bruising with both aspirin and Plavix. I asked him to decrease the dose of aspirin to 81 mg once daily. I will request carotid duplex ultrasound in 2 months from now to evaluate the left subclavian stent and pressure gradients. I will keep him on Plavix for another few months. His left arm claudication has resolved and he has a strong left radial pulse.

## 2012-07-06 ENCOUNTER — Ambulatory Visit: Payer: Self-pay | Admitting: Cardiovascular Disease

## 2012-07-06 DIAGNOSIS — R0602 Shortness of breath: Secondary | ICD-10-CM | POA: Diagnosis not present

## 2012-07-06 DIAGNOSIS — R079 Chest pain, unspecified: Secondary | ICD-10-CM | POA: Diagnosis not present

## 2012-07-09 ENCOUNTER — Telehealth: Payer: Self-pay | Admitting: *Deleted

## 2012-07-09 NOTE — Telephone Encounter (Signed)
Message copied by Kendrick Fries on Mon Jul 09, 2012 11:15 AM ------      Message from: MCNEAL, MELISSA E      Created: Mon Jul 09, 2012 10:27 AM                   ----- Message -----         From: [name], MD         Sent: 07/06/2012   7:47 PM           To: Marcelle Overlie, RN            His stress test was normal. I think his dyspnea is likely due to COPD.  ------

## 2012-07-09 NOTE — Telephone Encounter (Signed)
Notified patient per Dr. Kirke Corin stress test is normal and he feels the shortness of breath is coming from his COPD. Told the patient if the shortness of breath worsens to contact our office before his next follow up which is in April, 2014.

## 2012-07-09 NOTE — Telephone Encounter (Signed)
lmtcb to inform pt of stress test results.

## 2012-07-12 ENCOUNTER — Encounter (HOSPITAL_COMMUNITY): Payer: Self-pay | Admitting: Emergency Medicine

## 2012-07-12 ENCOUNTER — Ambulatory Visit (INDEPENDENT_AMBULATORY_CARE_PROVIDER_SITE_OTHER): Payer: Medicare Other | Admitting: Family Medicine

## 2012-07-12 ENCOUNTER — Inpatient Hospital Stay (HOSPITAL_COMMUNITY)
Admission: EM | Admit: 2012-07-12 | Discharge: 2012-07-17 | DRG: 065 | Disposition: A | Discharge status: Home / Self Care | Payer: Medicare Other | Attending: Neurology | Admitting: Neurology

## 2012-07-12 ENCOUNTER — Encounter: Payer: Self-pay | Admitting: Family Medicine

## 2012-07-12 ENCOUNTER — Ambulatory Visit
Admission: RE | Admit: 2012-07-12 | Discharge: 2012-07-12 | Disposition: A | Discharge status: Home / Self Care | Payer: Medicare Other | Source: Ambulatory Visit | Attending: Family Medicine | Admitting: Family Medicine

## 2012-07-12 VITALS — BP 120/60 | HR 91 | Temp 97.4°F | Ht 69.0 in | Wt 170.0 lb

## 2012-07-12 DIAGNOSIS — I10 Essential (primary) hypertension: Secondary | ICD-10-CM | POA: Diagnosis not present

## 2012-07-12 DIAGNOSIS — R51 Headache: Secondary | ICD-10-CM

## 2012-07-12 DIAGNOSIS — I2589 Other forms of chronic ischemic heart disease: Secondary | ICD-10-CM | POA: Diagnosis present

## 2012-07-12 DIAGNOSIS — I5022 Chronic systolic (congestive) heart failure: Secondary | ICD-10-CM | POA: Diagnosis not present

## 2012-07-12 DIAGNOSIS — M129 Arthropathy, unspecified: Secondary | ICD-10-CM | POA: Diagnosis present

## 2012-07-12 DIAGNOSIS — J449 Chronic obstructive pulmonary disease, unspecified: Secondary | ICD-10-CM | POA: Diagnosis not present

## 2012-07-12 DIAGNOSIS — F29 Unspecified psychosis not due to a substance or known physiological condition: Secondary | ICD-10-CM | POA: Diagnosis not present

## 2012-07-12 DIAGNOSIS — Z9861 Coronary angioplasty status: Secondary | ICD-10-CM

## 2012-07-12 DIAGNOSIS — Z87891 Personal history of nicotine dependence: Secondary | ICD-10-CM

## 2012-07-12 DIAGNOSIS — I2510 Atherosclerotic heart disease of native coronary artery without angina pectoris: Secondary | ICD-10-CM | POA: Diagnosis present

## 2012-07-12 DIAGNOSIS — E785 Hyperlipidemia, unspecified: Secondary | ICD-10-CM | POA: Diagnosis present

## 2012-07-12 DIAGNOSIS — R519 Headache, unspecified: Secondary | ICD-10-CM

## 2012-07-12 DIAGNOSIS — I619 Nontraumatic intracerebral hemorrhage, unspecified: Secondary | ICD-10-CM

## 2012-07-12 DIAGNOSIS — I509 Heart failure, unspecified: Secondary | ICD-10-CM | POA: Diagnosis present

## 2012-07-12 DIAGNOSIS — R0600 Dyspnea, unspecified: Secondary | ICD-10-CM | POA: Diagnosis not present

## 2012-07-12 DIAGNOSIS — J4489 Other specified chronic obstructive pulmonary disease: Secondary | ICD-10-CM | POA: Diagnosis not present

## 2012-07-12 DIAGNOSIS — R0602 Shortness of breath: Secondary | ICD-10-CM | POA: Diagnosis not present

## 2012-07-12 DIAGNOSIS — I629 Nontraumatic intracranial hemorrhage, unspecified: Principal | ICD-10-CM | POA: Diagnosis present

## 2012-07-12 DIAGNOSIS — J438 Other emphysema: Secondary | ICD-10-CM | POA: Diagnosis not present

## 2012-07-12 DIAGNOSIS — Y9210 Unspecified residential institution as the place of occurrence of the external cause: Secondary | ICD-10-CM | POA: Diagnosis not present

## 2012-07-12 DIAGNOSIS — I872 Venous insufficiency (chronic) (peripheral): Secondary | ICD-10-CM | POA: Diagnosis present

## 2012-07-12 DIAGNOSIS — R0989 Other specified symptoms and signs involving the circulatory and respiratory systems: Secondary | ICD-10-CM

## 2012-07-12 DIAGNOSIS — I7389 Other specified peripheral vascular diseases: Secondary | ICD-10-CM | POA: Diagnosis present

## 2012-07-12 DIAGNOSIS — Z8679 Personal history of other diseases of the circulatory system: Secondary | ICD-10-CM | POA: Diagnosis not present

## 2012-07-12 DIAGNOSIS — T4275XA Adverse effect of unspecified antiepileptic and sedative-hypnotic drugs, initial encounter: Secondary | ICD-10-CM | POA: Diagnosis not present

## 2012-07-12 DIAGNOSIS — E039 Hypothyroidism, unspecified: Secondary | ICD-10-CM | POA: Diagnosis present

## 2012-07-12 DIAGNOSIS — I359 Nonrheumatic aortic valve disorder, unspecified: Secondary | ICD-10-CM | POA: Diagnosis not present

## 2012-07-12 DIAGNOSIS — Z951 Presence of aortocoronary bypass graft: Secondary | ICD-10-CM

## 2012-07-12 DIAGNOSIS — R0609 Other forms of dyspnea: Secondary | ICD-10-CM | POA: Diagnosis not present

## 2012-07-12 DIAGNOSIS — I615 Nontraumatic intracerebral hemorrhage, intraventricular: Secondary | ICD-10-CM | POA: Diagnosis present

## 2012-07-12 HISTORY — DX: Nontraumatic intracerebral hemorrhage, intraventricular: I61.5

## 2012-07-12 LAB — CBC WITH DIFFERENTIAL/PLATELET
Basophils Absolute: 0 10*3/uL (ref 0.0–0.1)
HCT: 47.6 % (ref 39.0–52.0)
Hemoglobin: 16.4 g/dL (ref 13.0–17.0)
Lymphocytes Relative: 15 % (ref 12–46)
Monocytes Absolute: 0.6 10*3/uL (ref 0.1–1.0)
Monocytes Relative: 5 % (ref 3–12)
Neutro Abs: 8.9 10*3/uL — ABNORMAL HIGH (ref 1.7–7.7)
RBC: 5.48 MIL/uL (ref 4.22–5.81)
RDW: 13.4 % (ref 11.5–15.5)
WBC: 11.1 10*3/uL — ABNORMAL HIGH (ref 4.0–10.5)

## 2012-07-12 LAB — PROTIME-INR: Prothrombin Time: 14 seconds (ref 11.6–15.2)

## 2012-07-12 LAB — BASIC METABOLIC PANEL
CO2: 20 mEq/L (ref 19–32)
Chloride: 98 mEq/L (ref 96–112)
Creatinine, Ser: 1.44 mg/dL — ABNORMAL HIGH (ref 0.50–1.35)

## 2012-07-12 MED ORDER — ONDANSETRON HCL 4 MG/2ML IJ SOLN
4.0000 mg | Freq: Four times a day (QID) | INTRAMUSCULAR | Status: DC | PRN
  Administered 2012-07-13: 4 mg via INTRAVENOUS
  Filled 2012-07-12: qty 2

## 2012-07-12 MED ORDER — HYDROMORPHONE HCL PF 1 MG/ML IJ SOLN
0.5000 mg | INTRAMUSCULAR | Status: DC | PRN
  Administered 2012-07-13 – 2012-07-17 (×20): 0.5 mg via INTRAVENOUS
  Filled 2012-07-12 (×19): qty 1

## 2012-07-12 MED ORDER — PANTOPRAZOLE SODIUM 40 MG IV SOLR
40.0000 mg | Freq: Every day | INTRAVENOUS | Status: DC
  Administered 2012-07-12: 40 mg via INTRAVENOUS
  Filled 2012-07-12 (×2): qty 40

## 2012-07-12 MED ORDER — LABETALOL HCL 5 MG/ML IV SOLN: 10.0000 mg | INTRAVENOUS | Status: DC | PRN

## 2012-07-12 MED ORDER — ACETAMINOPHEN 325 MG PO TABS
650.0000 mg | ORAL_TABLET | ORAL | Status: DC | PRN
  Administered 2012-07-13: 650 mg via ORAL
  Filled 2012-07-12: qty 2

## 2012-07-12 MED ORDER — ONDANSETRON HCL 4 MG/2ML IJ SOLN
4.0000 mg | Freq: Once | INTRAMUSCULAR | Status: AC
  Administered 2012-07-12: 4 mg via INTRAVENOUS
  Filled 2012-07-12: qty 2

## 2012-07-12 MED ORDER — SENNOSIDES-DOCUSATE SODIUM 8.6-50 MG PO TABS
1.0000 | ORAL_TABLET | Freq: Two times a day (BID) | ORAL | Status: DC
  Administered 2012-07-12 – 2012-07-17 (×10): 1 via ORAL
  Filled 2012-07-12 (×12): qty 1

## 2012-07-12 MED ORDER — HYDROCODONE-ACETAMINOPHEN 5-325 MG PO TABS: 1.0000 | ORAL_TABLET | Freq: Four times a day (QID) | ORAL | Status: DC | PRN

## 2012-07-12 MED ORDER — MORPHINE SULFATE 4 MG/ML IJ SOLN
4.0000 mg | Freq: Once | INTRAMUSCULAR | Status: AC
  Administered 2012-07-12: 4 mg via INTRAVENOUS
  Filled 2012-07-12: qty 1

## 2012-07-12 MED ORDER — SODIUM CHLORIDE 0.9 % IV SOLN
INTRAVENOUS | Status: DC
  Administered 2012-07-12 – 2012-07-14 (×4): via INTRAVENOUS

## 2012-07-12 MED ORDER — ACETAMINOPHEN 650 MG RE SUPP: 650.0000 mg | RECTAL | Status: DC | PRN

## 2012-07-12 MED ORDER — LABETALOL HCL 5 MG/ML IV SOLN
10.0000 mg | Freq: Once | INTRAVENOUS | Status: AC
  Administered 2012-07-12: 10 mg via INTRAVENOUS
  Filled 2012-07-12: qty 4

## 2012-07-12 MED ORDER — LABETALOL HCL 5 MG/ML IV SOLN
2.0000 mg/min | INTRAVENOUS | Status: DC
  Administered 2012-07-12: 0.5 mg/min via INTRAVENOUS
  Administered 2012-07-13: 0.8 mg/min via INTRAVENOUS
  Administered 2012-07-13: 1.2 mg/min via INTRAVENOUS
  Administered 2012-07-14: 2 mg/min via INTRAVENOUS
  Filled 2012-07-12 (×5): qty 100

## 2012-07-12 NOTE — H&P (Signed)
Admission H&P    Chief Complaint: Persistent severe headache.   HPI: [name] is an 72 y.o. male history of hypertension, hyperlipidemia, coronary artery disease with CABG, left subclavian artery stent placement and COPD, presenting with a history of persistent headache over about one month with marked increase in headache severity over the past 3 days. He describes the pain as 8-9/10 in intensity and worse with lying down. There is no associated nausea and he said no visual changes. He's also had no speech changes nor focal weakness. CT scan of his head showed right intraventricular hemorrhage involving the right lateral ventricle and extending into the left lateral and third ventricles. There was no evidence of hydrocephalus. NIH stroke scale is 0. Patient has been on aspirin and Plavix daily.  LSN: Unclear tPA Given: No: IVH. MRankin: 1  Past Medical History  Diagnosis Date  . Thyroid disease     hypo  . Venous insufficiency   . Peripheral arterial disease     Previous left lower extremity stenting by Dr. Evette Cristal  . Tobacco abuse   . COPD (chronic obstructive pulmonary disease)   . Arthritis   . Hypothyroidism   . Subclavian artery stenosis, left 05/2012    Status post stenting of the ostium and self-expanding stent placement to the left axillary artery    Past Surgical History  Procedure Laterality Date  . Cholecystectomy    . Angioplasty / stenting femoral    . Coronary artery bypass graft  07/11/2011    Procedure: CORONARY ARTERY BYPASS GRAFTING (CABG);  Surgeon: [name], MD;  Location: [facility] OR;  Service: Open Heart Surgery;  Laterality: N/A;  Times 3. On Pump. Using right greater saphenous vein and left internal mammary artery.   . Cardiac catheterization  2/14    [facility]  . Subclavian artery stent  2014    x2    Family History  Problem Relation Age of Onset  . Alcohol abuse Father   . Cirrhosis Father   . Hypothyroidism Sister    Social History:  reports that  he quit smoking about 14 months ago. His smoking use included Cigarettes. He has a 50 pack-year smoking history. He has never used smokeless tobacco. He reports that he does not drink alcohol or use illicit drugs.  Allergies: No Known Allergies   (Not in a hospital admission)  ROS: Review of systems is negative except for presenting persistent headache as described above, and generally feeling fatigued.  Physical Examination: Blood pressure 212/102, pulse 99, temperature 98.2 F (36.8 C), temperature source Oral, resp. rate 18, SpO2 98.00%.  HEENT-  Normocephalic, no lesions, without obvious abnormality.  Normal external eye and conjunctiva.  Normal TM's bilaterally.  Normal auditory canals and external ears. Normal external nose, mucus membranes and septum.  Normal pharynx. Neck supple with no masses, nodes, nodules or enlargement. Cardiovascular - regular rate and rhythm, S1, S2 normal, no murmur, click, rub or gallop Lungs - chest clear, no wheezing, rales, normal symmetric air entry, Heart exam - S1, S2 normal, no murmur, no gallop, rate regular Abdomen - soft, non-tender; bowel sounds normal; no masses,  no organomegaly Extremities - no joint deformities, effusion, or inflammation and no edema  Neurologic Examination: Mental Status: Alert, oriented, slight short-term memory difficulty.  Speech fluent without evidence of aphasia. Able to follow commands without difficulty. Cranial Nerves: II-Visual fields were normal. III/IV/VI-Pupils were equal and reacted. Extraocular movements were full and conjugate.    V/VII-no facial numbness and  no facial weakness. VIII-normal. X-normal speech and symmetrical palatal movement. Motor: 5/5 bilaterally with normal tone and bulk Sensory: Normal throughout. Deep Tendon Reflexes: 1+ and symmetric. Plantars: Flexor bilaterally Cerebellar: Normal finger-to-nose testing. Carotid auscultation: Normal  Results for orders placed during the  hospital encounter of 07/12/12 (from the past 48 hour(s))  CBC WITH DIFFERENTIAL     Status: Abnormal   Collection Time    07/12/12  6:44 PM      Result Value Range   WBC 11.1 (*) 4.0 - 10.5 K/uL   RBC 5.48  4.22 - 5.81 MIL/uL   Hemoglobin 16.4  13.0 - 17.0 g/dL   HCT 16.1  09.6 - 04.5 %   MCV 86.9  78.0 - 100.0 fL   MCH 29.9  26.0 - 34.0 pg   MCHC 34.5  30.0 - 36.0 g/dL   RDW 40.9  81.1 - 91.4 %   Platelets 217  150 - 400 K/uL   Neutrophils Relative 80 (*) 43 - 77 %   Neutro Abs 8.9 (*) 1.7 - 7.7 K/uL   Lymphocytes Relative 15  12 - 46 %   Lymphs Abs 1.6  0.7 - 4.0 K/uL   Monocytes Relative 5  3 - 12 %   Monocytes Absolute 0.6  0.1 - 1.0 K/uL   Eosinophils Relative 0  0 - 5 %   Eosinophils Absolute 0.0  0.0 - 0.7 K/uL   Basophils Relative 0  0 - 1 %   Basophils Absolute 0.0  0.0 - 0.1 K/uL  BASIC METABOLIC PANEL     Status: Abnormal   Collection Time    07/12/12  6:44 PM      Result Value Range   Sodium 134 (*) 135 - 145 mEq/L   Potassium 4.7  3.5 - 5.1 mEq/L   Chloride 98  96 - 112 mEq/L   CO2 20  19 - 32 mEq/L   Glucose, Bld 108 (*) 70 - 99 mg/dL   BUN 22  6 - 23 mg/dL   Creatinine, Ser 7.82 (*) 0.50 - 1.35 mg/dL   Calcium 95.6  8.4 - 21.3 mg/dL   GFR calc non Af Amer 47 (*) >90 mL/min   GFR calc Af Amer 55 (*) >90 mL/min   Comment:            The eGFR has been calculated     using the CKD EPI equation.     This calculation has not been     validated in all clinical     situations.     eGFR's persistently     <90 mL/min signify     possible Chronic Kidney Disease.  PROTIME-INR     Status: None   Collection Time    07/12/12  6:44 PM      Result Value Range   Prothrombin Time 14.0  11.6 - 15.2 seconds   INR 1.09  0.00 - 1.49  APTT     Status: None   Collection Time    07/12/12  6:44 PM      Result Value Range   aPTT 27  24 - 37 seconds   Ct Head Wo Contrast  07/12/2012  *RADIOLOGY REPORT*  Clinical Data: Severe headache.  Dizziness.  Patient on  antiplatelet therapy (Plavix).  CT HEAD WITHOUT CONTRAST  Technique:  Contiguous axial images were obtained from the base of the skull through the vertex without contrast.  Comparison: None.  Findings: Intraventricular hemorrhage into the right lateral  ventricle and minimally in the left lateral ventricle and third ventricle.  No blood is visualized in the fourth ventricle.  No intraparenchymal hemorrhage or subarachnoid hemorrhage is visualized.  Mild dilation of the lateral ventricles is symmetric and felt to be due to deep atrophy.  No convincing hydrocephalus. No midline shift.  Cavum vergae.  Moderate cortical atrophy.  Moderate changes of small vessel disease of the white matter.  Moderate cerebellar atrophy.  No skull fracture or other focal osseous abnormality involving the skull.  Visualized paranasal sinuses, bilateral mastoid air cells, and bilateral middle ear cavities well-aerated.  Extensive bilateral carotid siphon and vertebrobasilar atherosclerosis.  IMPRESSION:  1.  Intraventricular hemorrhage into the right lateral ventricle and to a lesser degree the left lateral ventricle and third ventricle.  No evidence of hydrocephalus at this time. 2.  No visible parenchymal hemorrhage or subarachnoid hemorrhage. 3.  Moderate generalized atrophy and moderate chronic microvascular ischemic changes of the white matter.  Critical Value/emergent results were called by telephone at the time of interpretation on 07/12/2012 at 1800 hours to Dr. Dallas Schimke, who verbally acknowledged these results.   Original Report Authenticated By: Hulan Saas, M.D.     Assessment: 72 y.o. male with multiple medical problems including hypertension presenting with right intraventricular hemorrhage with extension into left lateral and third ventricles with no signs of hydrocephalus. Most likely etiology is hypertension.  Stroke Risk Factors - hyperlipidemia and hypertension  Plan: 1. HgbA1c, fasting lipid panel 2. MRI, MRA   of the brain without contrast 3. PT consult, OT consult, Speech consult 4. Echocardiogram 5. Carotid dopplers 6. Prophylactic therapy-None 7. Risk factor modification 7. Telemetry monitoring  C.R. Roseanne Reno, MD [facility] [phone]  07/12/2012, 7:48 PM

## 2012-07-12 NOTE — ED Notes (Signed)
Asked Dr. Rubin Payor about titrating labetalol drip (EDP preferred pressure); EDP states that he is fine with systolic being in 140's.  EDP denies needs to titrate drip at this time.  Will continue to monitor.

## 2012-07-12 NOTE — ED Provider Notes (Signed)
History     CSN: [id number]  Arrival date & time 07/12/12  9562   First MD Initiated Contact with Patient 07/12/12 1834      Chief Complaint  Patient presents with  . Headache    (Consider location/radiation/quality/duration/timing/severity/associated sxs/prior treatment) Patient is a 72 y.o. male presenting with headaches. The history is provided by the patient.  Headache Pain location:  Frontal Associated symptoms: nausea and vomiting   Associated symptoms: no abdominal pain, no back pain, no diarrhea, no pain, no neck stiffness and no numbness   patient  Has had a headache for there was few days. It is severe on his frontal head. He's also had some nausea and vomiting. He saw his primary care Dr. doubt a head CT and found intracranial hemorrhage. He was sent into the hospital for admission. No chest pain. No numbness weakness. No confusion. He is on Plavix for previous vascular stenting.  Past Medical History  Diagnosis Date  . Thyroid disease     hypo  . Venous insufficiency   . Peripheral arterial disease     Previous left lower extremity stenting by Dr. Evette Cristal  . Tobacco abuse   . COPD (chronic obstructive pulmonary disease)   . Arthritis   . Hypothyroidism   . Subclavian artery stenosis, left 05/2012    Status post stenting of the ostium and self-expanding stent placement to the left axillary artery    Past Surgical History  Procedure Laterality Date  . Cholecystectomy    . Angioplasty / stenting femoral    . Coronary artery bypass graft  07/11/2011    Procedure: CORONARY ARTERY BYPASS GRAFTING (CABG);  Surgeon: [name], MD;  Location: [facility] OR;  Service: Open Heart Surgery;  Laterality: N/A;  Times 3. On Pump. Using right greater saphenous vein and left internal mammary artery.   . Cardiac catheterization  2/14    [facility]  . Subclavian artery stent  2014    x2    Family History  Problem Relation Age of Onset  . Alcohol abuse Father   . Cirrhosis Father    . Hypothyroidism Sister     History  Substance Use Topics  . Smoking status: Former Smoker -- 1.00 packs/day for 50 years    Types: Cigarettes    Quit date: 04/27/2011  . Smokeless tobacco: Never Used  . Alcohol Use: No      Review of Systems  Constitutional: Negative for activity change and appetite change.  HENT: Negative for neck stiffness.   Eyes: Negative for pain.  Respiratory: Negative for chest tightness and shortness of breath.   Cardiovascular: Negative for chest pain and leg swelling.  Gastrointestinal: Positive for nausea and vomiting. Negative for abdominal pain and diarrhea.  Genitourinary: Negative for flank pain.  Musculoskeletal: Negative for back pain.  Skin: Negative for rash.  Neurological: Positive for headaches. Negative for weakness and numbness.  Psychiatric/Behavioral: Negative for behavioral problems.    Allergies  Review of patient's allergies indicates no known allergies.  Home Medications   Current Outpatient Rx  Name  Route  Sig  Dispense  Refill  . aspirin 81 MG tablet   Oral   Take 1 tablet (81 mg total) by mouth daily.   30 tablet      . atorvastatin (LIPITOR) 20 MG tablet   Oral   Take 1 tablet (20 mg total) by mouth daily at 6 PM.   30 tablet   3   . carvedilol (COREG) 3.125 MG tablet  Oral   Take 1 tablet (3.125 mg total) by mouth 2 (two) times daily with a meal.   60 tablet   3   . clopidogrel (PLAVIX) 75 MG tablet   Oral   Take 1 tablet (75 mg total) by mouth daily.   30 tablet   3   . HYDROcodone-acetaminophen (NORCO/VICODIN) 5-325 MG per tablet   Oral   Take 1 tablet by mouth every 6 (six) hours as needed for pain.   30 tablet   0   . levothyroxine (SYNTHROID, LEVOTHROID) 200 MCG tablet   Oral   Take 200 mcg by mouth daily.         Marland Kitchen lisinopril (PRINIVIL,ZESTRIL) 5 MG tablet   Oral   Take 1 tablet (5 mg total) by mouth daily.   30 tablet   3     BP 148/65  Pulse 90  Temp(Src) 99.1 F (37.3 C)  (Oral)  Resp 26  SpO2 97%  Physical Exam  Nursing note and vitals reviewed. Constitutional: He is oriented to person, place, and time. He appears well-developed and well-nourished.  Patient appears  is when he reason for her uncomfortable.  HENT:  Head: Normocephalic and atraumatic.  Eyes: EOM are normal. Pupils are equal, round, and reactive to light.  Neck: Normal range of motion. Neck supple.  Cardiovascular: Normal rate, regular rhythm and normal heart sounds.   No murmur heard. Pulmonary/Chest: Effort normal and breath sounds normal.  Abdominal: Soft. Bowel sounds are normal. He exhibits no distension and no mass. There is no tenderness. There is no rebound and no guarding.  Musculoskeletal: Normal range of motion. He exhibits no edema.  Neurological: He is alert and oriented to person, place, and time. No cranial nerve deficit.  Skin: Skin is warm and dry.  Psychiatric: He has a normal mood and affect.    ED Course  Procedures (including critical care time)  Labs Reviewed  CBC WITH DIFFERENTIAL - Abnormal; Notable for the following:    WBC 11.1 (*)    Neutrophils Relative 80 (*)    Neutro Abs 8.9 (*)    All other components within normal limits  BASIC METABOLIC PANEL - Abnormal; Notable for the following:    Sodium 134 (*)    Glucose, Bld 108 (*)    Creatinine, Ser 1.44 (*)    GFR calc non Af Amer 47 (*)    GFR calc Af Amer 55 (*)    All other components within normal limits  PROTIME-INR  APTT   Ct Head Wo Contrast  07/12/2012  *RADIOLOGY REPORT*  Clinical Data: Severe headache.  Dizziness.  Patient on antiplatelet therapy (Plavix).  CT HEAD WITHOUT CONTRAST  Technique:  Contiguous axial images were obtained from the base of the skull through the vertex without contrast.  Comparison: None.  Findings: Intraventricular hemorrhage into the right lateral ventricle and minimally in the left lateral ventricle and third ventricle.  No blood is visualized in the fourth  ventricle.  No intraparenchymal hemorrhage or subarachnoid hemorrhage is visualized.  Mild dilation of the lateral ventricles is symmetric and felt to be due to deep atrophy.  No convincing hydrocephalus. No midline shift.  Cavum vergae.  Moderate cortical atrophy.  Moderate changes of small vessel disease of the white matter.  Moderate cerebellar atrophy.  No skull fracture or other focal osseous abnormality involving the skull.  Visualized paranasal sinuses, bilateral mastoid air cells, and bilateral middle ear cavities well-aerated.  Extensive bilateral carotid siphon and vertebrobasilar  atherosclerosis.  IMPRESSION:  1.  Intraventricular hemorrhage into the right lateral ventricle and to a lesser degree the left lateral ventricle and third ventricle.  No evidence of hydrocephalus at this time. 2.  No visible parenchymal hemorrhage or subarachnoid hemorrhage. 3.  Moderate generalized atrophy and moderate chronic microvascular ischemic changes of the white matter.  Critical Value/emergent results were called by telephone at the time of interpretation on 07/12/2012 at 1800 hours to Dr. Dallas Schimke, who verbally acknowledged these results.   Original Report Authenticated By: Hulan Saas, M.D.      1. Intraventricular hemorrhage   2. Hypertension    CRITICAL CARE Performed by: Billee Cashing   Total critical care time: 30  Critical care time was exclusive of separately billable procedures and treating other patients.  Critical care was necessary to treat or prevent imminent or life-threatening deterioration.  Critical care was time spent personally by me on the following activities: development of treatment plan with patient and/or surrogate as well as nursing, discussions with consultants, evaluation of patient's response to treatment, examination of patient, obtaining history from patient or surrogate, ordering and performing treatments and interventions, ordering and review of laboratory  studies, ordering and review of radiographic studies, pulse oximetry and re-evaluation of patient's condition.   MDM  Patient presents with intracranial hemorrhage. It is interventricular. He was also moderately hypertensive. Nausea medicine and pain medicines were given for the headache. IV labetalol was required for blood pressure control. Patient's mental status is good. He'll be admitted to the ICU by neurology.        Juliet Rude. Rubin Payor, MD 07/13/12 1610

## 2012-07-12 NOTE — ED Notes (Signed)
Patient updated on plan of care. Patient resting on stretcher, family member at bedside. Awaiting medication from pharmacy at this time. Patient rates pain at 8/10. resp are even and unlabored, no acute distress noted. Will continue to monitor.

## 2012-07-12 NOTE — ED Notes (Signed)
Patient resting quietly on stretcher at this time. No acute distress noted. Family member at bedside. Call bell within reach. Plan of care discussed with patient. Will continue to monitor.

## 2012-07-12 NOTE — Progress Notes (Signed)
[facility] [address] [place] [address] Phone: [phone] Fax: [phone]  Date:  07/12/2012   Name:  [name]   DOB:  1940/10/23   MRN:  [id number] Gender: male Age: 72 y.o.  Primary Physician:  Kerby Nora, MD  Evaluating MD: [name], MD   Chief Complaint: Sinusitis   History of Present Illness:  [name] is a 72 y.o. pleasant patient who presents with the following:  Pleasant gentleman who I remember well from last year when he had severe onset SOB, ultimately found to have severe CAD on Cath, and had CABG and extensive hospital stay at [facility].  Today: Left sided frontal headache - after the stents were put in in February, started to get a ha during the day, cannot sleep,cannot lay down, miserable all the time. 8-9/10  - escalating over the last 3 days Sometimes room is spinning No position will make it worse --- but later he says lying down makes it worse. A little bit of dizziness, but quickly.  Not moving well, holding head.  Not acting himself.   Dry cough - ever since CABG  Nose running:  Pain all the time, throbs when moves. Tylenol did not help. With nasal decongestant it would help. Moist heat helped.   Just had 2 stents put in a month ago and was started on Plavix.  Patient Active Problem List  Diagnosis  . HYPOTHYROIDISM NOS  . HYPERCHOLESTEROLEMIA  . TOBACCO ABUSE  . Essential hypertension, benign  . ABDOMINAL AORTIC ANEURYSM  . UNSPECIFIED PERIPHERAL VASCULAR DISEASE  . COPD (chronic obstructive pulmonary disease)  . FATIGUE  . PROSTATE SPECIFIC ANTIGEN, ELEVATED  . BPH (benign prostatic hyperplasia)  . EKG abnormalities  . Cerumen impaction  . Dizziness  . Carotid bruit  . Subclavian arterial stenosis  . SOB (shortness of breath)  . CAD (coronary artery disease), native coronary artery  . Ischemic cardiomyopathy  . Pleural effusion  . Peripheral arterial disease    Past Medical  History  Diagnosis Date  . Thyroid disease     hypo  . Venous insufficiency   . Peripheral arterial disease     Previous left lower extremity stenting by Dr. Evette Cristal  . Tobacco abuse   . COPD (chronic obstructive pulmonary disease)   . Arthritis   . Hypothyroidism   . Subclavian artery stenosis, left 05/2012    Status post stenting of the ostium and self-expanding stent placement to the left axillary artery    Past Surgical History  Procedure Laterality Date  . Cholecystectomy    . Angioplasty / stenting femoral    . Coronary artery bypass graft  07/11/2011    Procedure: CORONARY ARTERY BYPASS GRAFTING (CABG);  Surgeon: [name], MD;  Location: [facility] OR;  Service: Open Heart Surgery;  Laterality: N/A;  Times 3. On Pump. Using right greater saphenous vein and left internal mammary artery.   . Cardiac catheterization  2/14    [facility]  . Subclavian artery stent  2014    x2    History   Social History  . Marital Status: Married    Spouse Name: N/A    Number of Children: 2  . Years of Education: N/A   Occupational History  . Retired     Comptroller   Social History Main Topics  . Smoking status: Former Smoker -- 1.00 packs/day for 50 years    Types: Cigarettes    Quit date:  04/27/2011  . Smokeless tobacco: Never Used  . Alcohol Use: No  . Drug Use: No  . Sexually Active: Not Currently   Other Topics Concern  . Not on file   Social History Narrative   Exercsiing 3 times a week.    Moderate diet control.    O living will, no HCPOA.    Family History  Problem Relation Age of Onset  . Alcohol abuse Father   . Cirrhosis Father   . Hypothyroidism Sister     No Known Allergies  Medication list has been reviewed and updated.  Outpatient Prescriptions Prior to Visit  Medication Sig Dispense Refill  . atorvastatin (LIPITOR) 20 MG tablet Take 1 tablet (20 mg total) by mouth daily at 6 PM.  30 tablet  3  . carvedilol (COREG) 3.125 MG tablet Take 1 tablet  (3.125 mg total) by mouth 2 (two) times daily with a meal.  60 tablet  3  . clopidogrel (PLAVIX) 75 MG tablet Take 1 tablet (75 mg total) by mouth daily.  30 tablet  3  . levothyroxine (SYNTHROID, LEVOTHROID) 200 MCG tablet Take 200 mcg by mouth daily.      Marland Kitchen lisinopril (PRINIVIL,ZESTRIL) 5 MG tablet Take 1 tablet (5 mg total) by mouth daily.  30 tablet  3  . aspirin 81 MG tablet Take 1 tablet (81 mg total) by mouth daily.  30 tablet     No facility-administered medications prior to visit.    Review of Systems:  Severe HA, no blurred vision, no slurred speech, no facial drooping, not off balance, occ sob, no chest pain. Otherwise, the pertinent positives and negatives are listed above and in the HPI, otherwise a full review of systems has been reviewed and is negative unless noted positive.  Physical Examination: BP 120/60  Pulse 91  Temp(Src) 97.4 F (36.3 C) (Oral)  Ht 5\' 9"  (1.753 m)  Wt 170 lb (77.111 kg)  BMI 25.09 kg/m2  SpO2 97%  Ideal Body Weight: Weight in (lb) to have BMI = 25: 168.9   GEN: WDWN, NAD, Non-toxic, A & O x 3 HEENT: Atraumatic, Normocephalic. Neck supple. No masses, No LAD. Ears and Nose: No external deformity. CV: RRR, No M/G/R. No JVD. No thrill. No extra heart sounds. PULM: CTA B, no wheezes, crackles, rhonchi. No retractions. No resp. distress. No accessory muscle use. ABD: S, NT, ND, +BS. No rebound tenderness. No HSM.  EXTR: No c/c/e Neuro: CN 2-12 grossly intact. PERRLA. EOMI. Sensation intact throughout. A and o x 4. PSYCH: Normally interactive. Conversant. Not depressed or anxious appearing.  Calm demeanor.   Objective:  Ct Head Wo Contrast  07/12/2012  *RADIOLOGY REPORT*  Clinical Data: Severe headache.  Dizziness.  Patient on antiplatelet therapy (Plavix).  CT HEAD WITHOUT CONTRAST  Technique:  Contiguous axial images were obtained from the base of the skull through the vertex without contrast.  Comparison: None.  Findings: Intraventricular  hemorrhage into the right lateral ventricle and minimally in the left lateral ventricle and third ventricle.  No blood is visualized in the fourth ventricle.  No intraparenchymal hemorrhage or subarachnoid hemorrhage is visualized.  Mild dilation of the lateral ventricles is symmetric and felt to be due to deep atrophy.  No convincing hydrocephalus. No midline shift.  Cavum vergae.  Moderate cortical atrophy.  Moderate changes of small vessel disease of the white matter.  Moderate cerebellar atrophy.  No skull fracture or other focal osseous abnormality involving the skull.  Visualized paranasal sinuses,  bilateral mastoid air cells, and bilateral middle ear cavities well-aerated.  Extensive bilateral carotid siphon and vertebrobasilar atherosclerosis.  IMPRESSION:  1.  Intraventricular hemorrhage into the right lateral ventricle and to a lesser degree the left lateral ventricle and third ventricle.  No evidence of hydrocephalus at this time. 2.  No visible parenchymal hemorrhage or subarachnoid hemorrhage. 3.  Moderate generalized atrophy and moderate chronic microvascular ischemic changes of the white matter.  Critical Value/emergent results were called by telephone at the time of interpretation on 07/12/2012 at 1800 hours to Dr. Dallas Schimke, who verbally acknowledged these results.   Original Report Authenticated By: Hulan Saas, M.D.     Assessment and Plan:  Hemorrhage, intracranial  Severe headache - Plan: CT Head Wo Contrast  As above, CT back, evidence of intraventricular hemorrhage in right and left lateral ventricles, third ventricles.  I received the call minutes away from [facility], so I went there directly and spoke to patient and his wife face to face. Patient's wife is blind, so I drove patient and wife to ER myself, transport time less than 5 minutes.  I also spoke on phone to Dr. Lyman Bishop and the ER staff.  Orders Today:  Orders Placed This Encounter  Procedures  . CT Head  Wo Contrast    **STAT CALL REPORT [phone] CELL AFTER 500PM/**[phone] BEFORE 500PM    Standing Status: Future     Number of Occurrences: 1     Standing Expiration Date: 10/12/2013    Order Specific Question:  Preferred imaging location?    Answer:  [id number]    Order Specific Question:  Reason for exam:    Answer:  worsening headaches to 9/10 HA, severe, on Plavix, rule out subdural    Updated Medication List: (Includes new medications, updates to list, dose adjustments) Meds ordered this encounter  Medications  . HYDROcodone-acetaminophen (NORCO/VICODIN) 5-325 MG per tablet    Sig: Take 1 tablet by mouth every 6 (six) hours as needed for pain.    Dispense:  30 tablet    Refill:  0    Medications Discontinued: There are no discontinued medications.    Signed, Elpidio Galea. Abygail Galeno, MD 07/12/2012 3:19 PM

## 2012-07-12 NOTE — ED Notes (Signed)
Patient report called to [facility] for admission. Nurse voiced understanding of report and had no further questions. Patient is in stable condition for transport. Patient will be going to room via stretcher and labetalol infusing. Family member with patient.

## 2012-07-12 NOTE — ED Notes (Signed)
Pt c/o severe headache but became worse today with vomiting.  Pt was sent for CT scan by his MD and was told to come to ED ref. bleed

## 2012-07-12 NOTE — ED Notes (Signed)
Patient requesting for more pain medication, MD notified at this time.

## 2012-07-12 NOTE — ED Notes (Signed)
Waiting on labetalol drip from pharmacy; called about delay -- pharmacy states that they are sending medication down now.

## 2012-07-12 NOTE — Patient Instructions (Addendum)
REFERRAL: GO THE THE FRONT ROOM AT THE ENTRANCE OF OUR CLINIC, NEAR CHECK IN. ASK FOR [name]. SHE WILL HELP YOU SET UP YOUR REFERRAL. DATE: TIME:  

## 2012-07-13 ENCOUNTER — Inpatient Hospital Stay (HOSPITAL_COMMUNITY): Payer: Medicare Other

## 2012-07-13 DIAGNOSIS — R51 Headache: Secondary | ICD-10-CM | POA: Diagnosis present

## 2012-07-13 DIAGNOSIS — R519 Headache, unspecified: Secondary | ICD-10-CM | POA: Diagnosis present

## 2012-07-13 MED ORDER — ATORVASTATIN CALCIUM 20 MG PO TABS
20.0000 mg | ORAL_TABLET | Freq: Every day | ORAL | Status: DC
  Administered 2012-07-13 – 2012-07-17 (×5): 20 mg via ORAL
  Filled 2012-07-13 (×5): qty 1

## 2012-07-13 MED ORDER — CARVEDILOL 3.125 MG PO TABS
3.1250 mg | ORAL_TABLET | Freq: Two times a day (BID) | ORAL | Status: DC
  Administered 2012-07-13 – 2012-07-14 (×2): 3.125 mg via ORAL
  Filled 2012-07-13 (×4): qty 1

## 2012-07-13 MED ORDER — PANTOPRAZOLE SODIUM 40 MG PO TBEC
40.0000 mg | DELAYED_RELEASE_TABLET | Freq: Every day | ORAL | Status: DC
  Administered 2012-07-13 – 2012-07-16 (×4): 40 mg via ORAL
  Filled 2012-07-13 (×4): qty 1

## 2012-07-13 MED ORDER — LEVOTHYROXINE SODIUM 200 MCG PO TABS
200.0000 ug | ORAL_TABLET | Freq: Every day | ORAL | Status: DC
  Administered 2012-07-13 – 2012-07-17 (×5): 200 ug via ORAL
  Filled 2012-07-13 (×7): qty 1

## 2012-07-13 MED ORDER — TRAMADOL HCL 50 MG PO TABS
100.0000 mg | ORAL_TABLET | Freq: Four times a day (QID) | ORAL | Status: DC | PRN
  Administered 2012-07-13 – 2012-07-16 (×10): 100 mg via ORAL
  Administered 2012-07-17: 50 mg via ORAL
  Filled 2012-07-13 (×12): qty 2

## 2012-07-13 MED ORDER — LISINOPRIL 5 MG PO TABS
5.0000 mg | ORAL_TABLET | Freq: Every day | ORAL | Status: DC
  Administered 2012-07-13 – 2012-07-14 (×2): 5 mg via ORAL
  Filled 2012-07-13 (×2): qty 1

## 2012-07-13 NOTE — Progress Notes (Signed)
UR completed 

## 2012-07-13 NOTE — Progress Notes (Signed)
Stroke Team Progress Note  HISTORY [name] is an 72 y.o. male history of hypertension, hyperlipidemia, coronary artery disease with CABG, left subclavian artery stent placement and COPD, presenting with a history of persistent headache over about one month with marked increase in headache severity over the past 3 days. He describes the pain as 8-9/10 in intensity and worse with lying down. There is no associated nausea and he said no visual changes. He's also had no speech changes nor focal weakness. CT scan of his head showed right intraventricular hemorrhage involving the right lateral ventricle and extending into the left lateral and third ventricles. There was no evidence of hydrocephalus. NIH stroke scale is 0. Patient has been on aspirin and Plavix daily. Patient was not a TPA candidate secondary to unclear time of onset and hemorrhage. He was admitted for further evaluation and treatment.  SUBJECTIVE His wife is at the bedside.  Overall he feels his condition is stable. BP in the office was within normal limits, by the time he arrived here, it was elevated. Complains of frontal headache, worse on the left.  OBJECTIVE Most recent Vital Signs: Filed Vitals:   07/13/12 0600 07/13/12 0700 07/13/12 0800 07/13/12 0900  BP: 129/48 140/52 135/57 133/53  Pulse: 75 75 76 73  Temp:  98.3 F (36.8 C)    TempSrc:  Oral    Resp: 17 20 18 16   Height:      Weight:      SpO2: 92% 93% 95% 91%   CBG (last 3)  No results found for this basename: GLUCAP,  in the last 72 hours  IV Fluid Intake:   . sodium chloride 75 mL/hr at 07/12/12 2233  . labetalol (NORMODYNE) infusion 0.5 mg/min (07/13/12 0800)    MEDICATIONS  . pantoprazole  40 mg Oral QHS  . senna-docusate  1 tablet Oral BID   PRN:  acetaminophen, acetaminophen, HYDROmorphone (DILAUDID) injection, labetalol, ondansetron (ZOFRAN) IV  Diet:  Cardiac thin liquids Activity:  Bedrest DVT Prophylaxis:  SCDs   CLINICALLY SIGNIFICANT  STUDIES Basic Metabolic Panel:   Recent Labs Lab 07/12/12 1844  NA 134*  K 4.7  CL 98  CO2 20  GLUCOSE 108*  BUN 22  CREATININE 1.44*  CALCIUM 10.0   Liver Function Tests: No results found for this basename: AST, ALT, ALKPHOS, BILITOT, PROT, ALBUMIN,  in the last 168 hours CBC:   Recent Labs Lab 07/12/12 1844  WBC 11.1*  NEUTROABS 8.9*  HGB 16.4  HCT 47.6  MCV 86.9  PLT 217   Coagulation:   Recent Labs Lab 07/12/12 1844  LABPROT 14.0  INR 1.09   Cardiac Enzymes: No results found for this basename: CKTOTAL, CKMB, CKMBINDEX, TROPONINI,  in the last 168 hours Urinalysis: No results found for this basename: COLORURINE, APPERANCEUR, LABSPEC, PHURINE, GLUCOSEU, HGBUR, BILIRUBINUR, KETONESUR, PROTEINUR, UROBILINOGEN, NITRITE, LEUKOCYTESUR,  in the last 168 hours Lipid Panel    Component Value Date/Time   CHOL 109 11/21/2011 0917   TRIG 106.0 11/21/2011 0917   HDL 48.90 11/21/2011 0917   CHOLHDL 2 11/21/2011 0917   VLDL 21.2 11/21/2011 0917   LDLCALC 39 11/21/2011 0917   HgbA1C  Lab Results  Component Value Date   HGBA1C 5.4 07/11/2011    Urine Drug Screen:   No results found for this basename: labopia,  cocainscrnur,  labbenz,  amphetmu,  thcu,  labbarb    Alcohol Level: No results found for this basename: ETH,  in the last 168 hours  CT of the brain  07/12/2012   1.  Intraventricular hemorrhage into the right lateral ventricle and to a lesser degree the left lateral ventricle and third ventricle.  No evidence of hydrocephalus at this time. 2.  No visible parenchymal hemorrhage or subarachnoid hemorrhage. 3.  Moderate generalized atrophy and moderate chronic microvascular ischemic changes of the white matter.    MRI of the brain    MRA of the brain    EKG  sinus tachycardia 102  Therapy Recommendations   Physical Exam    GENERAL EXAM: Patient is in no distress  CARDIOVASCULAR: Regular rate and rhythm, no murmurs, no carotid bruits  NEUROLOGIC: MENTAL  STATUS: awake, alert, language fluent, comprehension intact, naming intact CRANIAL NERVE: pupils equal and reactive to light, visual fields full to confrontation, extraocular muscles intact, no nystagmus, facial sensation and strength symmetric, uvula midline, shoulder shrug symmetric, tongue midline. MOTOR: normal bulk and tone, full strength in the BUE, BLE SENSORY: normal and symmetric to light touch COORDINATION: finger-nose-finger, fine finger movements, normal REFLEXES: deep tendon reflexes present and symmetric   ASSESSMENT [name] is a 72 y.o. male presenting with headache headache x 1 mo with worsening 3 days prior to admission. Imaging confirms a right greater than Left IVH, no IPH. Hemorrhage felt to be secondary to malignant hypertension.  On aspirin 81 mg orally every day and clopidogrel 75 mg orally every day prior to admission. plavix added to aspirin following since left subclavian and left axillary stents placed 06/20/2012.  Patient with resultant severe headache, nausea. Work up underway.   Malignant Hypertension, BP in ED 212/102  PAD w/ hx stenting LLE  Hospital day # 1  TREATMENT/PLAN  OOB, therapy evals.  MRI and MRA of the brain  Resume home meds except aspirin and plavix. Consider resuming aspirin soon given recent stents  Ultram along with with morphine and dilaudid as needed for headache  BP control (goal SBP < 160)  SHARON BIBY, MSN, RN, ANVP-BC, ANP-BC, GNP-BC [facility] Pager: [phone] 07/13/2012 11:38 AM  I have personally obtained a history, examined the patient, evaluated imaging results, and formulated the assessment and plan of care. I agree with the above. Will resume aspirin 81mg  daily after MRI, if IVH is stable. Will hold off on plavix for now. Discussed with Dr. Mariah Milling and Dr. Excell Seltzer by phone.   Suanne Marker, MD 07/13/2012, 12:58 PM Certified in Neurology, Neurophysiology and Neuroimaging [facility] - Stroke Team  Please refer to [email or web address] for on-call Stroke MD

## 2012-07-13 NOTE — Progress Notes (Signed)
OT Cancellation Note  Patient Details Name: BERTRUM HELMSTETTER MRN: [id number] DOB: 12-10-1940   Cancelled Treatment:    Reason Eval/Treat Not Completed: Pain limiting ability to participate (c/o 10/10 headache)  [facility] Hades Mathew, OTR/L  [phone] 07/13/2012 07/13/2012, 3:41 PM

## 2012-07-13 NOTE — Progress Notes (Signed)
PT Cancellation Note  Patient Details Name: [name] MRN: [id number] DOB: March 12, 1941   Cancelled Treatment:    Reason Eval/Treat Not Completed: Patient not medically ready (pt on bedrest).  Pt will need to be off of bedrest for PT to complete evaluation.     Rollene Rotunda Kydan Shanholtzer, PT, DPT [phone]   07/13/2012, 11:00 AM

## 2012-07-14 MED ORDER — HYDRALAZINE HCL 20 MG/ML IJ SOLN
INTRAMUSCULAR | Status: AC
  Filled 2012-07-14: qty 1

## 2012-07-14 MED ORDER — HYDRALAZINE HCL 20 MG/ML IJ SOLN
10.0000 mg | Freq: Once | INTRAMUSCULAR | Status: AC
  Administered 2012-07-14: 10 mg via INTRAVENOUS

## 2012-07-14 MED ORDER — CARVEDILOL 6.25 MG PO TABS
6.2500 mg | ORAL_TABLET | Freq: Two times a day (BID) | ORAL | Status: DC
  Administered 2012-07-14 – 2012-07-15 (×2): 6.25 mg via ORAL
  Filled 2012-07-14 (×4): qty 1

## 2012-07-14 MED ORDER — HYDRALAZINE HCL 20 MG/ML IJ SOLN
INTRAMUSCULAR | Status: AC
  Administered 2012-07-14: 10 mg
  Filled 2012-07-14: qty 1

## 2012-07-14 MED ORDER — LABETALOL HCL 5 MG/ML IV SOLN
5.0000 mg | INTRAVENOUS | Status: DC | PRN
  Administered 2012-07-14 – 2012-07-16 (×10): 10 mg via INTRAVENOUS
  Filled 2012-07-14 (×8): qty 4

## 2012-07-14 MED ORDER — LISINOPRIL 10 MG PO TABS: 10.0000 mg | ORAL_TABLET | Freq: Every day | ORAL | Status: DC

## 2012-07-14 MED ORDER — LISINOPRIL 10 MG PO TABS
10.0000 mg | ORAL_TABLET | Freq: Every day | ORAL | Status: DC
  Administered 2012-07-14 – 2012-07-15 (×2): 10 mg via ORAL
  Filled 2012-07-14 (×2): qty 1

## 2012-07-14 NOTE — Progress Notes (Signed)
Stroke Team Progress Note  HISTORY [name] is an 72 y.o. male history of hypertension, hyperlipidemia, coronary artery disease with CABG, left subclavian artery stent placement and COPD, presenting with a history of persistent headache over about one month with marked increase in headache severity over the past 3 days. He describes the pain as 8-9/10 in intensity and worse with lying down. There is no associated nausea and he said no visual changes. He's also had no speech changes nor focal weakness. CT scan of his head showed right intraventricular hemorrhage involving the right lateral ventricle and extending into the left lateral and third ventricles. There was no evidence of hydrocephalus. NIH stroke scale is 0. Patient has been on aspirin and Plavix daily. Patient was not a TPA candidate secondary to unclear time of onset and hemorrhage. He was admitted for further evaluation and treatment.  SUBJECTIVE His wife is at the bedside. Overall he feels his condition is stable. Complains of frontal headache, worse on the left.  OBJECTIVE Most recent Vital Signs: Filed Vitals:   07/14/12 1000 07/14/12 1100 07/14/12 1128 07/14/12 1200  BP: 139/63 160/57  169/76  Pulse: 82 73  76  Temp:   98.5 F (36.9 C)   TempSrc:   Oral   Resp: 22 17  19   Height:      Weight:      SpO2: 97% 95%  96%   CBG (last 3)  No results found for this basename: GLUCAP,  in the last 72 hours  IV Fluid Intake:   . sodium chloride 75 mL/hr at 07/14/12 0300  . labetalol (NORMODYNE) infusion 1 mg/min (07/14/12 1200)    MEDICATIONS  . atorvastatin  20 mg Oral q1800  . carvedilol  3.125 mg Oral BID WC  . levothyroxine  200 mcg Oral Q breakfast  . lisinopril  5 mg Oral Daily  . pantoprazole  40 mg Oral QHS  . senna-docusate  1 tablet Oral BID   PRN:  acetaminophen, acetaminophen, HYDROmorphone (DILAUDID) injection, labetalol, ondansetron (ZOFRAN) IV, traMADol  Diet:  Cardiac thin liquids Activity:   Bedrest DVT Prophylaxis:  SCDs   CLINICALLY SIGNIFICANT STUDIES Basic Metabolic Panel:   Recent Labs Lab 07/12/12 1844  NA 134*  K 4.7  CL 98  CO2 20  GLUCOSE 108*  BUN 22  CREATININE 1.44*  CALCIUM 10.0   Liver Function Tests: No results found for this basename: AST, ALT, ALKPHOS, BILITOT, PROT, ALBUMIN,  in the last 168 hours CBC:   Recent Labs Lab 07/12/12 1844  WBC 11.1*  NEUTROABS 8.9*  HGB 16.4  HCT 47.6  MCV 86.9  PLT 217   Coagulation:   Recent Labs Lab 07/12/12 1844  LABPROT 14.0  INR 1.09   Cardiac Enzymes: No results found for this basename: CKTOTAL, CKMB, CKMBINDEX, TROPONINI,  in the last 168 hours Urinalysis: No results found for this basename: COLORURINE, APPERANCEUR, LABSPEC, PHURINE, GLUCOSEU, HGBUR, BILIRUBINUR, KETONESUR, PROTEINUR, UROBILINOGEN, NITRITE, LEUKOCYTESUR,  in the last 168 hours Lipid Panel    Component Value Date/Time   CHOL 109 11/21/2011 0917   TRIG 106.0 11/21/2011 0917   HDL 48.90 11/21/2011 0917   CHOLHDL 2 11/21/2011 0917   VLDL 21.2 11/21/2011 0917   LDLCALC 39 11/21/2011 0917   HgbA1C  Lab Results  Component Value Date   HGBA1C 5.4 07/11/2011    Urine Drug Screen:   No results found for this basename: labopia,  cocainscrnur,  labbenz,  amphetmu,  thcu,  labbarb  Alcohol Level: No results found for this basename: ETH,  in the last 168 hours  CT of the brain  07/12/2012   1.  Intraventricular hemorrhage into the right lateral ventricle and to a lesser degree the left lateral ventricle and third ventricle.  No evidence of hydrocephalus at this time. 2.  No visible parenchymal hemorrhage or subarachnoid hemorrhage. 3.  Moderate generalized atrophy and moderate chronic microvascular ischemic changes of the white matter.    MRI of the brain 07/14/12 Intraventricular hemorrhage, unchanged from yesterday. No  hydrocephalus. Negative for acute infarct.   MRA of the brain  07/14/12 Mild atherosclerotic disease. No large vessel  occlusion  EKG  sinus tachycardia 102  Therapy Recommendations   Physical Exam    GENERAL EXAM: Patient is in MILD DISTRESS FOR HA.  CARDIOVASCULAR: Regular rate and rhythm, no murmurs, no carotid bruits  NEUROLOGIC: MENTAL STATUS: awake, alert, language fluent, comprehension intact, naming intact CRANIAL NERVE: pupils equal and reactive to light, visual fields full to confrontation, extraocular muscles intact, no nystagmus, facial sensation and strength symmetric, uvula midline, shoulder shrug symmetric, tongue midline. MOTOR: normal bulk and tone, full strength in the BUE, BLE SENSORY: normal and symmetric to light touch COORDINATION: finger-nose-finger, fine finger movements, normal REFLEXES: deep tendon reflexes present and symmetric   ASSESSMENT [name] is a 72 y.o. male presenting with headache headache x 1 mo with worsening 3 days prior to admission. Imaging confirms a right greater than Left IVH, no IPH. Hemorrhage felt to be secondary to malignant hypertension.  On aspirin 81 mg orally every day and clopidogrel 75 mg orally every day prior to admission. Plavix added to aspirin following since left subclavian and left axillary stents placed 06/20/2012.  Patient with resultant severe headache, nausea. Work up underway.   Malignant Hypertension, BP in ED 212/102  PAD w/ hx stenting LUE  Hospital day # 2  TREATMENT/PLAN  OOB, therapy evals  Continue aspirin 81mg  daily; plavix stopped in light of IVH  Ultram along with with morphine and dilaudid as needed for headache  BP control (goal SBP < 160); will adjust oral BP meds  Transfer to floor  I have personally obtained a history, examined the patient, evaluated imaging results, and formulated the assessment and plan of care. I agree with the above.   Suanne Marker, MD 07/14/2012, 1:29 PM Certified in Neurology, Neurophysiology and Neuroimaging [facility] - Stroke Team  Please refer to  [email or web address] for on-call Stroke MD

## 2012-07-14 NOTE — Progress Notes (Signed)
Pt arrived to floor in a bed,alert and oriented,c/o of slight headache,pt settled in room,call light at bedside,will continue to monitor. Obasogie-Asidi, [name]

## 2012-07-14 NOTE — Evaluation (Signed)
Physical Therapy Evaluation Patient Details Name: [name] MRN: [id number] DOB: 1941-03-05 Today's Date: 07/14/2012 Time: 0347-4259 PT Time Calculation (min): 10 min  PT Assessment / Plan / Recommendation Clinical Impression  Patient is a 72 yo male admitted with rt. intraventricular hemorrhage and severe HTN.  Patient with NIHSS score of 0.  Strength WFL and symmetrical.  Able to ambulate with good balance.  No acute PT needs identified - PT will sign off.  Encouraged ambulation in hallway with nursing.    PT Assessment  Patent does not need any further PT services    Follow Up Recommendations  No PT follow up;Supervision - Intermittent    Does the patient have the potential to tolerate intense rehabilitation      Barriers to Discharge        Equipment Recommendations  None recommended by PT    Recommendations for Other Services     Frequency      Precautions / Restrictions Precautions Precautions: None Restrictions Weight Bearing Restrictions: No   Pertinent Vitals/Pain Pain (headache) 8/10 limiting mobility.      Mobility  Bed Mobility Bed Mobility: Supine to Sit;Sit to Supine Supine to Sit: 5: Supervision;HOB flat Sit to Supine: 5: Supervision;HOB flat Details for Bed Mobility Assistance: No verbal cues or assist needed. Transfers Transfers: Sit to Stand;Stand to Sit Sit to Stand: 5: Supervision;With upper extremity assist;From bed Stand to Sit: 5: Supervision;With upper extremity assist;To bed Details for Transfer Assistance: Verbal cues for hand placement and safety.  Supervision for safety only. Ambulation/Gait Ambulation/Gait Assistance: 5: Supervision Ambulation Distance (Feet): 120 Feet Assistive device: Rolling walker Ambulation/Gait Assistance Details: Verbal cues for safe use of RW.  Cues to move at slower safe speed.  Ambulation limited by increasing headache.  BP157/74 Gait Pattern: Within Functional Limits Gait velocity: WFL Modified Rankin  (Stroke Patients Only) Pre-Morbid Rankin Score: No symptoms Modified Rankin: No significant disability      PT Goals  N/A  Visit Information  Last PT Received On: 07/14/12 Assistance Needed: +1    Subjective Data  Subjective: "My head still hurts pretty bad" Patient Stated Goal: To decrease headache.   Prior Functioning  Home Living Lives With: Spouse Available Help at Discharge: Family;Available 24 hours/day Type of Home: House Home Access: Stairs to enter [employer] of Steps: 3 Entrance Stairs-Rails: Right;Left Home Layout: Two level;Able to live on main level with bedroom/bathroom Bathroom Shower/Tub: Health visitor: Standard Home Adaptive Equipment: Bedside commode/3-in-1;Shower chair with back;Walker - rolling Prior Function Level of Independence: Independent Able to Take Stairs?: Yes Driving: Yes Vocation: Retired Musician: No difficulties    Copywriter, advertising Overall Cognitive Status: Appears within functional limits for tasks assessed/performed Arousal/Alertness: Awake/alert Orientation Level: Appears intact for tasks assessed Behavior During Session: [facility] for tasks performed    Extremity/Trunk Assessment Right Upper Extremity Assessment RUE ROM/Strength/Tone: WFL for tasks assessed RUE Sensation: WFL - Light Touch Left Upper Extremity Assessment LUE ROM/Strength/Tone: WFL for tasks assessed LUE Sensation: WFL - Light Touch Right Lower Extremity Assessment RLE ROM/Strength/Tone: WFL for tasks assessed RLE Sensation: WFL - Light Touch Left Lower Extremity Assessment LLE ROM/Strength/Tone: WFL for tasks assessed LLE Sensation: WFL - Light Touch Trunk Assessment Trunk Assessment: Normal   Balance Balance Balance Assessed: Yes High Level Balance High Level Balance Activites: Turns;Sudden stops;Head turns High Level Balance Comments: Patient without loss of balance with high level balance activities.   End of Session PT - End of Session Equipment Utilized During Treatment:  Gait belt Activity Tolerance: Patient limited by pain (Headache) Patient left: in bed;with call bell/phone within reach Nurse Communication: Mobility status (Encourged OOB and ambulation with nursing in hallway)  GP     Vena Austria 07/14/2012, 1:39 PM Durenda Hurt. Renaldo Fiddler, [facility] Pager [phone]

## 2012-07-15 ENCOUNTER — Inpatient Hospital Stay (HOSPITAL_COMMUNITY): Payer: Medicare Other

## 2012-07-15 ENCOUNTER — Encounter (HOSPITAL_COMMUNITY): Payer: Self-pay | Admitting: Radiology

## 2012-07-15 DIAGNOSIS — R0609 Other forms of dyspnea: Secondary | ICD-10-CM

## 2012-07-15 DIAGNOSIS — I10 Essential (primary) hypertension: Secondary | ICD-10-CM

## 2012-07-15 DIAGNOSIS — R0600 Dyspnea, unspecified: Secondary | ICD-10-CM | POA: Diagnosis not present

## 2012-07-15 DIAGNOSIS — Z8679 Personal history of other diseases of the circulatory system: Secondary | ICD-10-CM

## 2012-07-15 DIAGNOSIS — J449 Chronic obstructive pulmonary disease, unspecified: Secondary | ICD-10-CM

## 2012-07-15 DIAGNOSIS — E039 Hypothyroidism, unspecified: Secondary | ICD-10-CM

## 2012-07-15 LAB — CBC
MCH: 29.3 pg (ref 26.0–34.0)
MCHC: 35.3 g/dL (ref 30.0–36.0)
Platelets: 186 10*3/uL (ref 150–400)
RBC: 4.6 MIL/uL (ref 4.22–5.81)
RDW: 13.6 % (ref 11.5–15.5)

## 2012-07-15 LAB — BASIC METABOLIC PANEL
BUN: 19 mg/dL (ref 6–23)
Calcium: 8.8 mg/dL (ref 8.4–10.5)
Creatinine, Ser: 1.1 mg/dL (ref 0.50–1.35)
GFR calc non Af Amer: 66 mL/min — ABNORMAL LOW (ref >90)
Glucose, Bld: 99 mg/dL (ref 70–99)
Sodium: 128 mEq/L — ABNORMAL LOW (ref 135–145)

## 2012-07-15 MED ORDER — IOHEXOL 350 MG/ML SOLN
80.0000 mL | Freq: Once | INTRAVENOUS | Status: AC | PRN
  Administered 2012-07-15: 80 mL via INTRAVENOUS

## 2012-07-15 MED ORDER — IPRATROPIUM BROMIDE 0.02 % IN SOLN: 0.5000 mg | RESPIRATORY_TRACT | Status: DC | PRN

## 2012-07-15 MED ORDER — CARVEDILOL 12.5 MG PO TABS
12.5000 mg | ORAL_TABLET | Freq: Two times a day (BID) | ORAL | Status: DC
  Administered 2012-07-16 – 2012-07-17 (×4): 12.5 mg via ORAL
  Filled 2012-07-15 (×5): qty 1

## 2012-07-15 MED ORDER — LISINOPRIL 20 MG PO TABS
20.0000 mg | ORAL_TABLET | Freq: Every day | ORAL | Status: DC
  Administered 2012-07-16 – 2012-07-17 (×2): 20 mg via ORAL
  Filled 2012-07-15 (×2): qty 1

## 2012-07-15 MED ORDER — IPRATROPIUM BROMIDE 0.02 % IN SOLN
0.5000 mg | Freq: Three times a day (TID) | RESPIRATORY_TRACT | Status: DC
  Administered 2012-07-15 – 2012-07-16 (×2): 0.5 mg via RESPIRATORY_TRACT
  Filled 2012-07-15 (×2): qty 2.5

## 2012-07-15 MED ORDER — ALBUTEROL SULFATE (5 MG/ML) 0.5% IN NEBU: 2.5000 mg | INHALATION_SOLUTION | RESPIRATORY_TRACT | Status: DC | PRN

## 2012-07-15 MED ORDER — AMLODIPINE BESYLATE 5 MG PO TABS
5.0000 mg | ORAL_TABLET | Freq: Every day | ORAL | Status: DC
  Administered 2012-07-15 – 2012-07-16 (×2): 5 mg via ORAL
  Filled 2012-07-15 (×2): qty 1

## 2012-07-15 MED ORDER — ALBUTEROL SULFATE (5 MG/ML) 0.5% IN NEBU
2.5000 mg | INHALATION_SOLUTION | Freq: Three times a day (TID) | RESPIRATORY_TRACT | Status: DC
  Administered 2012-07-15 – 2012-07-16 (×2): 2.5 mg via RESPIRATORY_TRACT
  Filled 2012-07-15 (×2): qty 0.5

## 2012-07-15 NOTE — Progress Notes (Signed)
Pt had some breathing difficulty and it changed to pursed lip breathing this afternoon O2 sat 96% in room air. Patient mentioned it wasn't this bad yesterday. Informed Dr.Penumalli and received orders for CT head, chest X ray and EKG. Also the doctor arranged a hospitalist to see the patient. Fuller Canada, RN

## 2012-07-15 NOTE — Progress Notes (Signed)
Stroke Team Progress Note  HISTORY MAJOR [name] is an 72 y.o. left-handed male history with hypertension, hyperlipidemia, coronary artery disease with CABG, left subclavian artery stent placement and COPD, presenting with a history of persistent headache over about one month with marked increase in headache severity over the past 3 days. He describes the pain as 8-9/10 in intensity and worse with lying down. There is no associated nausea and he said no visual changes. He's also had no speech changes nor focal weakness. CT scan of his head showed right intraventricular hemorrhage involving the right lateral ventricle and extending into the left lateral and third ventricles. There was no evidence of hydrocephalus. NIH stroke scale is 0. Patient has been on aspirin and Plavix daily. Patient was not a TPA candidate secondary to unclear time of onset and hemorrhage. He was admitted for further evaluation and treatment.  SUBJECTIVE There are no family members present this morning. The patient has a severe headache this morning, but betteras of 2:21 PM some ongoing hiccups, mild and annoying. Patient doesn't want meds for this.  OBJECTIVE Most recent Vital Signs: Filed Vitals:   07/15/12 0054 07/15/12 0200 07/15/12 0600 07/15/12 0641  BP: 175/55 163/65 180/65 176/66  Pulse: 77 82 83 65  Temp:  98.8 F (37.1 C)    TempSrc:      Resp:  20 20   Height:      Weight:      SpO2:  94% 95%    CBG (last 3)  No results found for this basename: GLUCAP,  in the last 72 hours  IV Fluid Intake:      MEDICATIONS  . atorvastatin  20 mg Oral q1800  . carvedilol  6.25 mg Oral BID WC  . levothyroxine  200 mcg Oral Q breakfast  . lisinopril  10 mg Oral Daily  . pantoprazole  40 mg Oral QHS  . senna-docusate  1 tablet Oral BID   PRN:  acetaminophen, acetaminophen, HYDROmorphone (DILAUDID) injection, labetalol, ondansetron (ZOFRAN) IV, traMADol  Diet:  Cardiac thin liquids Activity:  Bedrest DVT  Prophylaxis:  SCDs   CLINICALLY SIGNIFICANT STUDIES Basic Metabolic Panel:   Recent Labs Lab 07/12/12 1844  NA 134*  K 4.7  CL 98  CO2 20  GLUCOSE 108*  BUN 22  CREATININE 1.44*  CALCIUM 10.0   Liver Function Tests: No results found for this basename: AST, ALT, ALKPHOS, BILITOT, PROT, ALBUMIN,  in the last 168 hours CBC:   Recent Labs Lab 07/12/12 1844  WBC 11.1*  NEUTROABS 8.9*  HGB 16.4  HCT 47.6  MCV 86.9  PLT 217   Coagulation:   Recent Labs Lab 07/12/12 1844  LABPROT 14.0  INR 1.09   Cardiac Enzymes: No results found for this basename: CKTOTAL, CKMB, CKMBINDEX, TROPONINI,  in the last 168 hours Urinalysis: No results found for this basename: COLORURINE, APPERANCEUR, LABSPEC, PHURINE, GLUCOSEU, HGBUR, BILIRUBINUR, KETONESUR, PROTEINUR, UROBILINOGEN, NITRITE, LEUKOCYTESUR,  in the last 168 hours Lipid Panel    Component Value Date/Time   CHOL 109 11/21/2011 0917   TRIG 106.0 11/21/2011 0917   HDL 48.90 11/21/2011 0917   CHOLHDL 2 11/21/2011 0917   VLDL 21.2 11/21/2011 0917   LDLCALC 39 11/21/2011 0917   HgbA1C  Lab Results  Component Value Date   HGBA1C 5.4 07/11/2011    Urine Drug Screen:   No results found for this basename: labopia,  cocainscrnur,  labbenz,  amphetmu,  thcu,  labbarb    Alcohol Level: No  results found for this basename: ETH,  in the last 168 hours  CT of the brain  07/12/2012   1.  Intraventricular hemorrhage into the right lateral ventricle and to a lesser degree the left lateral ventricle and third ventricle.  No evidence of hydrocephalus at this time. 2.  No visible parenchymal hemorrhage or subarachnoid hemorrhage. 3.  Moderate generalized atrophy and moderate chronic microvascular ischemic changes of the white matter.    MRI of the brain 07/14/12 Intraventricular hemorrhage, unchanged from yesterday. No  hydrocephalus. Negative for acute infarct.   MRA of the brain  07/14/12 Mild atherosclerotic disease. No large vessel  occlusion  EKG  sinus tachycardia 102  Therapy Impressions - illness limiting treatment.  Physical Exam   General - 72 year old male seated in a chair, rubbing his head, somewhat anxious and uncomfortable. Heart - Regular rate and rhythm - no murmer Lungs - mild rhonchi Abdomen - Soft - non tender Extremities - Distal pulses weak to absent - no edema Skin - Warm and dry   NEUROLOGIC: MENTAL STATUS: awake, alert, language fluent, comprehension intact, naming intact. The patient is oriented; although, at times he has difficulty thinking and expressing himself. He denies word finding difficulties. CRANIAL NERVE: pupils equal and reactive to light, visual fields full to confrontation, extraocular muscles intact, no nystagmus, facial sensation and strength symmetric, uvula midline, shoulder shrug symmetric, tongue midline. MOTOR: normal bulk and tone, full strength in the BUE, BLE SENSORY: normal and symmetric to light touch COORDINATION: finger-nose-finger with mild tremor bilaterally,  fine finger movements, normal REFLEXES: deep tendon reflexes present and symmetric   ASSESSMENT [name] is a 72 y.o. left-handed male presenting with headache headache x 1 mo with worsening 3 days prior to admission. Imaging confirms a right greater than Left IVH, no IPH. Hemorrhage felt to be secondary to malignant hypertension.  On aspirin 81 mg orally every day and clopidogrel 75 mg orally every day prior to admission. Plavix added to aspirin following since left subclavian and left axillary stents placed 06/20/2012.  Patient with resultant severe headache, nausea. Work up underway.   Malignant Hypertension, BP in ED 212/102  PAD w/ hx stenting LUE  Tobacco history / COPD  Hospital day # 3  TREATMENT/PLAN  OOB, therapy evals  Continue aspirin 81mg  daily; plavix stopped in light of IVH  Ultram along with with morphine and dilaudid as needed for headache  BP control (goal SBP < 160);  will adjust oral BP meds (increase lisinopril to 20mg  daily starting tomorrow; add amlodipine 5mg  today)  Due to labile BP, will keep in hospital and re-eval for discharge in next 1-2 days  Check cbc and bmp (cr 1.44)  Delton See PA-C [facility] Pager [phone] 07/15/2012, 8:49 AM   I have personally obtained a history, examined the patient, evaluated imaging results, and formulated the assessment and plan of care. I agree with the above. Needs better BP control. Meds adjusted.  Suanne Marker, MD 07/15/2012, 8:44 AM Certified in Neurology, Neurophysiology and Neuroimaging [facility] - Stroke Team  Please refer to [email or web address] for on-call Stroke MD

## 2012-07-15 NOTE — Consult Note (Addendum)
[facility] Medical Consultation  [name] ZOX:[id number] DOB: 03/10/1941 DOA: 07/12/2012 PCP: [name], MD   Requesting physician: [name], neurologist.  Date of consultation: 07/15/12.  Reason for consultation: Dyspnea.   Impression/Recommendations Principal Problem:   Intraventricular hemorrhage Active Problems:   Malignant hypertension   Headache   Dyspnea   History of chronic CHF   HTN (hypertension)    1. Dyspnea: Patient has experienced shortness of breath at rest, worse on exertion, since 2012-08-05, more pronounced today, and not associated with chest pain or productive cough. He has had no fever or chills, chest is clinically clear to auscultation, and O2 saturation is 93%-98% on RA. Etiology is unclear at this time, but given the fact that he was hospitalized on 07/12/12 and has a history of venous insufficiency, PE is certainly high on list of possible differentials. Clinically, he does not appear to have decompensated CHF, and clinical features do not support a pneumonia. Will order PCXR and CTA. If PE is found, he certainly will not be an anticoagulation candidate, given recent intracranial bleed, but an IVC filter may be feasible.  2. History of CHF: Patient has known history of CAD, ischemic cardiomyopathy s/p CABG 06/2011, chronic systolic CHF, EF 81%, per 2D echocardiogram of 05/08/12. As described above, clinically, he appears euvolemic, and certainly, has no evidence of decompensation at this time. Will repeat. 2D echocardiogram for completeness.  3. COPD: Patient has known history of COPD, and quit smoking about 3 months prior to his CABG in 06/2011. Clinically, he has no wheeze at this time, to suggest acute exacerbation. Will order prn bronchodilators.  4. HTN: This is uncontrolled at this time, despite Amlodipine, Lisinopril and Coreg. Will increase Coreg dose to 12.5 mg BID.  5. Hypothyroidism: On thyroxine replacement therapy.  6. Dyslipidemia:  On statin.  7. Intracranial/intraventricular hemorrhage: Appears neurologically stable at this time. Defer management to primary team.   Comment:  Thank you for this consultation. I will followup again tomorrow. Please contact me if I can be of assistance in the meanwhile.   Chief Complaint: Shortness of breath since 08-05-12. Denies chest pain.   HPI:  72 y.o. Male, with known history of previous tobacco abuse, HTN, dyslipidemia, hypothyroidism, CAD, ischemic cardiomyopathy s/p CABG 06/2011, chronic systolic CHF, EF 19%, per 2D echocardiogram of 05/08/12, PVD, bilateral 40%-59% ICA stenosis, s/p LLE femoral artery angioplasty/stent, left subclavian artery artery stenosis/steal syndrome, s/p angioplasty/stent 06/20/12, OA and COPD, admitted 07/12/12, with CT scan-confirmed right intraventricular hemorrhage involving the right lateral ventricle and extending into the left lateral and third ventricles. We have been requested to consult for shortness of breath, noticed today.    Review of Systems:  As per HPI and chief complaint. Patent denies fatigue, diminished appetite, weight loss, fever, chills, difficulty in speaking, dysphagia, chest pain. He has a mild dry, chronic cough, cough, but no orthopnea, paroxysmal nocturnal dyspnea, nausea, diaphoresis, abdominal pain, vomiting, diarrhea, belching, heartburn, hematemesis, melena, dysuria, nocturia, urinary frequency, hematochezia, lower extremity swelling, pain, or redness. The rest of the systems review is negative.   Past Medical History  Diagnosis Date  . Thyroid disease     hypo  . Venous insufficiency   . Peripheral arterial disease     Previous left lower extremity stenting by Dr. Evette Cristal  . Tobacco abuse   . COPD (chronic obstructive pulmonary disease)   . Arthritis   . Hypothyroidism   . Subclavian artery stenosis, left 05/2012    Status post stenting  of the ostium and self-expanding stent placement to the left axillary artery   Past  Surgical History  Procedure Laterality Date  . Cholecystectomy    . Angioplasty / stenting femoral    . Coronary artery bypass graft  07/11/2011    Procedure: CORONARY ARTERY BYPASS GRAFTING (CABG);  Surgeon: [name], MD;  Location: [facility] OR;  Service: Open Heart Surgery;  Laterality: N/A;  Times 3. On Pump. Using right greater saphenous vein and left internal mammary artery.   . Cardiac catheterization  2/14    [facility]  . Subclavian artery stent  2014    x2   Social History:  reports that he quit smoking about 14 months ago. His smoking use included Cigarettes. He has a 50 pack-year smoking history. He has never used smokeless tobacco. He reports that he does not drink alcohol or use illicit drugs.  No Known Allergies Family History  Problem Relation Age of Onset  . Alcohol abuse Father   . Cirrhosis Father   . Hypothyroidism Sister     Prior to Admission medications   Medication Sig Start Date End Date Taking? Authorizing Provider  aspirin 81 MG tablet Take 1 tablet (81 mg total) by mouth daily. 07/02/12  Yes [name], MD  atorvastatin (LIPITOR) 20 MG tablet Take 1 tablet (20 mg total) by mouth daily at 6 PM. 10/20/11 10/19/12 Yes [name], MD  carvedilol (COREG) 3.125 MG tablet Take 1 tablet (3.125 mg total) by mouth 2 (two) times daily with a meal. 09/27/11 09/26/12 Yes [name], MD  clopidogrel (PLAVIX) 75 MG tablet Take 1 tablet (75 mg total) by mouth daily. 06/21/12  Yes [name], MD  HYDROcodone-acetaminophen (NORCO/VICODIN) 5-325 MG per tablet Take 1 tablet by mouth every 6 (six) hours as needed for pain. 07/12/12  Yes [name], MD  levothyroxine (SYNTHROID, LEVOTHROID) 200 MCG tablet Take 200 mcg by mouth daily. 01/20/12  Yes [name], MD  lisinopril (PRINIVIL,ZESTRIL) 5 MG tablet Take 1 tablet (5 mg total) by mouth daily. 09/27/11 09/26/12 Yes [name], MD   Physical Exam: Blood pressure 182/74, pulse 84, temperature 98.9 F (37.2 C),  temperature source Oral, resp. rate 26, height 5\' 9"  (1.753 m), weight 79 kg (174 lb 2.6 oz), SpO2 96.00%. Filed Vitals:   07/15/12 0936 07/15/12 1100 07/15/12 1350 07/15/12 1537  BP: 176/66 180/70 172/71 182/74  Pulse: 85 82 79 84  Temp: 98.8 F (37.1 C)  98.9 F (37.2 C)   TempSrc: Oral  Oral   Resp: 19  24 26   Height:      Weight:      SpO2: 98%  93% 96%    General: Comfortable, has pursed lip breathing at rest, alert, communicative, fully oriented, talking in complete sentences.  HEENT:  No clinical pallor, no jaundice, no conjunctival injection or discharge. Hydration is fair.  NECK:  Supple, JVP not seen, no carotid bruits, no palpable lymphadenopathy, no palpable goiter. CHEST:  Clinically clear to auscultation, no wheezes, no crackles. Median sternotomy scar is noted.  HEART:  Sounds 1 and 2 heard, normal, regular, no murmurs. ABDOMEN:  Full, soft, non-tender, no palpable organomegaly, no palpable masses, normal bowel sounds. GENITALIA:  Not examined. LOWER EXTREMITIES:  No pitting edema, palpable peripheral pulses. MUSCULOSKELETAL SYSTEM:  Generalized osteoarthritic changes, otherwise, normal. CENTRAL NERVOUS SYSTEM:  No focal neurologic deficit on gross examination.  Labs on Admission:  Basic Metabolic Panel:  Recent Labs Lab 07/12/12 1844  NA 134*  K 4.7  CL 98  CO2 20  GLUCOSE 108*  BUN 22  CREATININE 1.44*  CALCIUM 10.0   Liver Function Tests: No results found for this basename: AST, ALT, ALKPHOS, BILITOT, PROT, ALBUMIN,  in the last 168 hours No results found for this basename: LIPASE, AMYLASE,  in the last 168 hours No results found for this basename: AMMONIA,  in the last 168 hours CBC:  Recent Labs Lab 07/12/12 1844  WBC 11.1*  NEUTROABS 8.9*  HGB 16.4  HCT 47.6  MCV 86.9  PLT 217   Cardiac Enzymes: No results found for this basename: CKTOTAL, CKMB, CKMBINDEX, TROPONINI,  in the last 168 hours BNP: No components found with this basename:  POCBNP,  CBG: No results found for this basename: GLUCAP,  in the last 168 hours  Radiological Exams on Admission: Mr Shirlee Latch Wo Contrast  07/14/2012  *RADIOLOGY REPORT*  Clinical Data:  Severe headache.  Hypertension.  Intraventricular hemorrhage  MRI HEAD WITHOUT CONTRAST MRA HEAD WITHOUT CONTRAST  Technique:  Multiplanar, multiecho pulse sequences of the brain and surrounding structures were obtained without intravenous contrast. Angiographic images of the head were obtained using MRA technique without contrast.  Comparison:  CT head 07/12/2012  MRI HEAD  Findings:  Intraventricular hemorrhage in the lateral ventricles, right greater than left is similar to the CT.  No hydrocephalus. No subarachnoid hemorrhage or subdural hemorrhage is identified.  Generalized atrophy.  Chronic microvascular ischemia in the white matter.  No acute infarct.  Small chronic infarct right cerebellum. Brainstem is intact.  Negative for mass lesion.  No midline shift.  IMPRESSION: Intraventricular hemorrhage, unchanged from yesterday.  No hydrocephalous  Negative for acute infarct.  MRA HEAD  Findings: Both vertebral arteries are patent to the basilar.  PICA is patent bilaterally.  Basilar is widely patent.  Superior cerebellar and posterior cerebral arteries are patent bilaterally.  The internal carotid artery is patent bilaterally.  There is a mild stenosis of the right supraclinoid internal carotid artery and mild irregularity in the left cavernous carotid due to atherosclerotic disease.  Anterior and middle cerebral arteries are patent bilaterally.  Mild irregularity of the middle cerebral artery branches bilaterally suggestive of atherosclerotic disease.  No large vessel occlusion or aneurysm.  IMPRESSION: Mild atherosclerotic disease.  No large vessel occlusion.   Original Report Authenticated By: Janeece Riggers, M.D.    Mr Brain Wo Contrast  07/14/2012  *RADIOLOGY REPORT*  Clinical Data:  Severe headache.  Hypertension.   Intraventricular hemorrhage  MRI HEAD WITHOUT CONTRAST MRA HEAD WITHOUT CONTRAST  Technique:  Multiplanar, multiecho pulse sequences of the brain and surrounding structures were obtained without intravenous contrast. Angiographic images of the head were obtained using MRA technique without contrast.  Comparison:  CT head 07/12/2012  MRI HEAD  Findings:  Intraventricular hemorrhage in the lateral ventricles, right greater than left is similar to the CT.  No hydrocephalus. No subarachnoid hemorrhage or subdural hemorrhage is identified.  Generalized atrophy.  Chronic microvascular ischemia in the white matter.  No acute infarct.  Small chronic infarct right cerebellum. Brainstem is intact.  Negative for mass lesion.  No midline shift.  IMPRESSION: Intraventricular hemorrhage, unchanged from yesterday.  No hydrocephalous  Negative for acute infarct.  MRA HEAD  Findings: Both vertebral arteries are patent to the basilar.  PICA is patent bilaterally.  Basilar is widely patent.  Superior cerebellar and posterior cerebral arteries are patent bilaterally.  The internal carotid artery is patent bilaterally.  There  is a mild stenosis of the right supraclinoid internal carotid artery and mild irregularity in the left cavernous carotid due to atherosclerotic disease.  Anterior and middle cerebral arteries are patent bilaterally.  Mild irregularity of the middle cerebral artery branches bilaterally suggestive of atherosclerotic disease.  No large vessel occlusion or aneurysm.  IMPRESSION: Mild atherosclerotic disease.  No large vessel occlusion.   Original Report Authenticated By: Janeece Riggers, M.D.     EKG: Independently reviewed. Shows SR, regular, 82/min, and no acute ischemic changes.   Time spent: 1 hour.   Ignatius Kloos,CHRISTOPHER [facility] Pager [phone]  If 7PM-7AM, please contact night-coverage [email or web address] Password [facility] 07/15/2012, 5:10 PM

## 2012-07-16 DIAGNOSIS — Z8679 Personal history of other diseases of the circulatory system: Secondary | ICD-10-CM

## 2012-07-16 DIAGNOSIS — I359 Nonrheumatic aortic valve disorder, unspecified: Secondary | ICD-10-CM

## 2012-07-16 DIAGNOSIS — R0989 Other specified symptoms and signs involving the circulatory and respiratory systems: Secondary | ICD-10-CM

## 2012-07-16 DIAGNOSIS — R0602 Shortness of breath: Secondary | ICD-10-CM

## 2012-07-16 LAB — CBC WITH DIFFERENTIAL/PLATELET
Basophils Relative: 0 % (ref 0–1)
Eosinophils Absolute: 0.1 10*3/uL (ref 0.0–0.7)
Eosinophils Relative: 1 % (ref 0–5)
Hemoglobin: 14.4 g/dL (ref 13.0–17.0)
MCH: 29.3 pg (ref 26.0–34.0)
MCHC: 34.4 g/dL (ref 30.0–36.0)
MCV: 85.3 fL (ref 78.0–100.0)
Monocytes Absolute: 1.3 10*3/uL — ABNORMAL HIGH (ref 0.1–1.0)
Monocytes Relative: 11 % (ref 3–12)
Neutrophils Relative %: 74 % (ref 43–77)

## 2012-07-16 LAB — BASIC METABOLIC PANEL
BUN: 19 mg/dL (ref 6–23)
Calcium: 9.1 mg/dL (ref 8.4–10.5)
Creatinine, Ser: 1.13 mg/dL (ref 0.50–1.35)
GFR calc Af Amer: 74 mL/min — ABNORMAL LOW (ref >90)
GFR calc non Af Amer: 64 mL/min — ABNORMAL LOW (ref >90)
Potassium: 4.4 mEq/L (ref 3.5–5.1)

## 2012-07-16 LAB — PRO B NATRIURETIC PEPTIDE: Pro B Natriuretic peptide (BNP): 9072 pg/mL — ABNORMAL HIGH (ref 0–125)

## 2012-07-16 MED ORDER — DOXYCYCLINE HYCLATE 100 MG IV SOLR
100.0000 mg | Freq: Two times a day (BID) | INTRAVENOUS | Status: DC
  Administered 2012-07-16 – 2012-07-17 (×2): 100 mg via INTRAVENOUS
  Filled 2012-07-16 (×3): qty 100

## 2012-07-16 MED ORDER — ALBUTEROL SULFATE (5 MG/ML) 0.5% IN NEBU: 2.5000 mg | INHALATION_SOLUTION | RESPIRATORY_TRACT | Status: DC | PRN

## 2012-07-16 MED ORDER — VALPROATE SODIUM 500 MG/5ML IV SOLN
1000.0000 mg | Freq: Once | INTRAVENOUS | Status: AC
  Administered 2012-07-16: 1000 mg via INTRAVENOUS
  Filled 2012-07-16: qty 10

## 2012-07-16 MED ORDER — TIOTROPIUM BROMIDE MONOHYDRATE 18 MCG IN CAPS
18.0000 ug | ORAL_CAPSULE | Freq: Every day | RESPIRATORY_TRACT | Status: DC
  Administered 2012-07-17: 18 ug via RESPIRATORY_TRACT
  Filled 2012-07-16: qty 5

## 2012-07-16 MED ORDER — AMLODIPINE BESYLATE 10 MG PO TABS
10.0000 mg | ORAL_TABLET | Freq: Every day | ORAL | Status: DC
  Administered 2012-07-17: 10 mg via ORAL
  Filled 2012-07-16: qty 1

## 2012-07-16 MED ORDER — DIVALPROEX SODIUM ER 500 MG PO TB24
500.0000 mg | ORAL_TABLET | Freq: Every day | ORAL | Status: DC
  Administered 2012-07-17: 500 mg via ORAL
  Filled 2012-07-16: qty 1

## 2012-07-16 MED ORDER — IPRATROPIUM BROMIDE 0.02 % IN SOLN: 0.5000 mg | RESPIRATORY_TRACT | Status: DC | PRN

## 2012-07-16 MED ORDER — METHYLPREDNISOLONE SODIUM SUCC 125 MG IJ SOLR
60.0000 mg | Freq: Two times a day (BID) | INTRAMUSCULAR | Status: DC
  Administered 2012-07-16 – 2012-07-17 (×2): 60 mg via INTRAVENOUS
  Filled 2012-07-16 (×4): qty 0.96

## 2012-07-16 NOTE — Progress Notes (Signed)
[facility] PROGRESS NOTE  Assessment/Plan: *Intraventricular hemorrhage - per neurology.    Dyspnea - no cough or CP. He continue to sat > 90% on RA. - ct angio showed no PE or pulmonary infiltrate. - rocnchi on physical exam, CXR brinchitic type changes, - steroid, spiriva and albuterol. - Check a BNP unlikely no JVD no orthopnea, cycle cardiac enzymes. - doxycycline.    History of chronic CHF: - compensated. NO JVD orthopnea or HJ reflex.   Malignant hypertension - mildly high.    Code Status: full Family Communication: wife  Disposition Plan: inpatient   Procedures: Ct angio chest 3.23.2014: Negative for pulmonary embolus. No acute abnormality.  2. Postoperative changes CABG. 3. Emphysema.    Antibiotics:  none  HPI/Subjective: Mildly SOB. No cough, able to lay flat to sleep. No PND.  Objective: Filed Vitals:   07/16/12 0602 07/16/12 0756 07/16/12 1000 07/16/12 1400  BP: 165/93  183/66 160/59  Pulse: 70  82 81  Temp: 98.6 F (37 C)  98.4 F (36.9 C) 99.8 F (37.7 C)  TempSrc: Oral  Oral Oral  Resp: 19  18 18   Height:      Weight:      SpO2: 96% 95% 94% 94%   No intake or output data in the 24 hours ending 07/16/12 1438 Filed Weights   07/12/12 2156 07/14/12 0500  Weight: 75.5 kg (166 lb 7.2 oz) 79 kg (174 lb 2.6 oz)    Exam:  General: Alert, awake, oriented x3, in no acute distress.  HEENT: No bruits, no goiter.  Heart: Regular rate and rhythm, without murmurs, rubs, gallops.  Lungs: Good air movement, rhonchi B/l some light expiratory wheezing.  Abdomen: Soft, nontender, nondistended, positive bowel sounds.  Neuro: Grossly intact, nonfocal.   Data Reviewed: Basic Metabolic Panel:  Recent Labs Lab 07/12/12 1844 07/15/12 1814 07/16/12 0720  NA 134* 128* 130*  K 4.7 4.0 4.4  CL 98 94* 95*  CO2 20 21 24   GLUCOSE 108* 99 97  BUN 22 19 19   CREATININE 1.44* 1.10 1.13  CALCIUM 10.0 8.8 9.1   Liver Function Tests: No results  found for this basename: AST, ALT, ALKPHOS, BILITOT, PROT, ALBUMIN,  in the last 168 hours No results found for this basename: LIPASE, AMYLASE,  in the last 168 hours No results found for this basename: AMMONIA,  in the last 168 hours CBC:  Recent Labs Lab 07/12/12 1844 07/15/12 1814 07/16/12 0720  WBC 11.1* 11.7* 11.9*  NEUTROABS 8.9*  --  8.8*  HGB 16.4 13.5 14.4  HCT 47.6 38.2* 41.9  MCV 86.9 83.0 85.3  PLT 217 186 213   Cardiac Enzymes: No results found for this basename: CKTOTAL, CKMB, CKMBINDEX, TROPONINI,  in the last 168 hours BNP (last 3 results)  Recent Labs  07/21/11 0605  PROBNP 8469.0*   CBG: No results found for this basename: GLUCAP,  in the last 168 hours  Recent Results (from the past 240 hour(s))  MRSA PCR SCREENING     Status: None   Collection Time    07/12/12  9:55 PM      Result Value Range Status   MRSA by PCR NEGATIVE  NEGATIVE Final   Comment:            The GeneXpert MRSA Assay (FDA     approved for NASAL specimens     only), is one component of a     comprehensive MRSA colonization     surveillance program. It  is not     intended to diagnose MRSA     infection nor to guide or     monitor treatment for     MRSA infections.     Studies: Dg Chest 2 View  07/15/2012  *RADIOLOGY REPORT*  Clinical Data: Shortness of breath.  CHEST - 2 VIEW  Comparison: 10/11/2011.  Findings: Stable surgical changes from bypass surgery.  The heart is normal in size.  The mediastinal and hilar contours are within normal limits and stable.  Stable tortuosity and calcification of the thoracic aorta.  There are bronchitic type lung changes suggesting bronchitis or interstitial pneumonitis.  No focal airspace consolidation.  Stable left pleural thickening. Vascular stents are noted.  IMPRESSION: Bronchitic type lung changes suggesting bronchitis.  No focal infiltrates.   Original Report Authenticated By: Rudie Meyer, M.D.    Ct Head Wo Contrast  07/15/2012   *RADIOLOGY REPORT*  Clinical Data: Follow up intraventricular hemorrhage  CT HEAD WITHOUT CONTRAST  Technique:  Contiguous axial images were obtained from the base of the skull through the vertex without contrast.  Comparison: 07/12/2012  Findings: There is no mass effect or midline shift.  Again noted intraventricular hemorrhage into the right lateral ventricle slight decrease in size from prior exam.  The previous left intraventricular hemorrhage is not identified.  No acute infarction.  No mass lesion is noted on this unenhanced scan.  Stable cerebral atrophy and chronic white matter disease.  IMPRESSION: Again noted right intraventricular hemorrhage with mild improvement from prior exam.  The previous left intraventricular hemorrhage is not identified.  No mass effect or midline shift.   Original Report Authenticated By: Natasha Mead, M.D.    Ct Angio Chest Pe W/cm &/or Wo Cm  07/15/2012  *RADIOLOGY REPORT*  Clinical Data: Short of breath.  Chest pain.  Dyspnea. Pulmonary embolism.  CT ANGIOGRAPHY CHEST  Technique:  Multidetector CT imaging of the chest using the standard protocol during bolus administration of intravenous contrast. Multiplanar reconstructed images including MIPs were obtained and reviewed to evaluate the vascular anatomy.  Contrast: 80mL OMNIPAQUE IOHEXOL 350 MG/ML SOLN  Comparison: Chest radiograph 07/15/2012.Chest CT 07/01/2011.  Findings: Technically adequate study.  Negative for pulmonary embolus.  CABG.  Incidental imaging the upper abdomen demonstrates nonspecific retroperitoneal stranding and cholecystectomy.  Aortic and branch vessel atherosclerosis is present.  Left subclavian artery stents.  Trace left pleural effusion.  No adenopathy.  There are no aggressive osseous lesions.  The lungs show emphysema and dependent atelectasis.  No pneumothorax or airspace disease.  Thoracic spine DISH and spondylosis.  IMPRESSION:  1.  Negative for pulmonary embolus.  No acute abnormality. 2.   Postoperative changes CABG. 3.  Emphysema.   Original Report Authenticated By: Andreas Newport, M.D.     Scheduled Meds: . [START ON 07/17/2012] amLODipine  10 mg Oral Daily  . atorvastatin  20 mg Oral q1800  . carvedilol  12.5 mg Oral BID WC  . [START ON 07/17/2012] divalproex  500 mg Oral Daily  . levothyroxine  200 mcg Oral Q breakfast  . lisinopril  20 mg Oral Daily  . pantoprazole  40 mg Oral QHS  . senna-docusate  1 tablet Oral BID   Continuous Infusions:    Marinda Elk  [facility] Pager [phone]. If 8PM-8AM, please contact night-coverage at [email or web address], password [facility] 07/16/2012, 2:38 PM  LOS: 4 days

## 2012-07-16 NOTE — Progress Notes (Signed)
CT angio results conveyed to [facility] NP, Claiborne Billings. No new orders received.

## 2012-07-16 NOTE — Progress Notes (Signed)
Order received, chart reviewed, noted pt was Supervision with PT for ambulation and they had signed off without any further PT needs with recommendation that pt ambulate with nursing. Spoke with pt and wife who report that no one has ambulated with pt since PT saw him except to the bathroom and back and that he has not been offered a bath/shower since he has been here. Wife will be at home with pt 24/7 to provide A prn if needed and they do have a shower seat at home (recommended to pt and wife that if patient felt he would get to tired to stand up and take a shower then the use of the shower seat would be a good idea. Pt's nurse off of unit, spoke to RN, Enrique Sack to make her aware of the request for a shower and that when pt was on the other unit PT had recommended that pt ambulate with nursing and that PT had signed off. No acute OT needs identified, will sign off. Ignacia Palma, [place] [phone] 07/16/2012

## 2012-07-16 NOTE — Progress Notes (Signed)
Stroke Team Progress Note  HISTORY [name] is an 72 y.o. male history of hypertension, hyperlipidemia, coronary artery disease with CABG, left subclavian artery stent placement and COPD, presenting with a history of persistent headache over about one month with marked increase in headache severity over the past 3 days. He describes the pain as 8-9/10 in intensity and worse with lying down. There is no associated nausea and he said no visual changes. He's also had no speech changes nor focal weakness. CT scan of his head showed right intraventricular hemorrhage involving the right lateral ventricle and extending into the left lateral and third ventricles. There was no evidence of hydrocephalus. NIH stroke scale is 0. Patient has been on aspirin and Plavix daily. Patient was not a TPA candidate secondary to unclear time of onset and hemorrhage. He was admitted for further evaluation and treatment.  SUBJECTIVE His wife is at the bedside. He continues to complain of severe headache. His SOB from last night is now resolved. Wife reports confusion.  OBJECTIVE Most recent Vital Signs: Filed Vitals:   07/16/12 0149 07/16/12 0602 07/16/12 0756 07/16/12 1000  BP: 166/62 165/93  183/66  Pulse: 72 70  82  Temp:  98.6 F (37 C)  98.4 F (36.9 C)  TempSrc:  Oral  Oral  Resp: 20 19  18   Height:      Weight:      SpO2: 94% 96% 95% 94%   CBG (last 3)  No results found for this basename: GLUCAP,  in the last 72 hours  IV Fluid Intake:      MEDICATIONS  . amLODipine  5 mg Oral Daily  . atorvastatin  20 mg Oral q1800  . carvedilol  12.5 mg Oral BID WC  . levothyroxine  200 mcg Oral Q breakfast  . lisinopril  20 mg Oral Daily  . pantoprazole  40 mg Oral QHS  . senna-docusate  1 tablet Oral BID   PRN:  acetaminophen, acetaminophen, albuterol, HYDROmorphone (DILAUDID) injection, ipratropium, labetalol, ondansetron (ZOFRAN) IV, traMADol  Diet:  Cardiac thin liquids Activity:  As tolerated DVT  Prophylaxis:  SCDs   CLINICALLY SIGNIFICANT STUDIES Basic Metabolic Panel:   Recent Labs Lab 07/15/12 1814 07/16/12 0720  NA 128* 130*  K 4.0 4.4  CL 94* 95*  CO2 21 24  GLUCOSE 99 97  BUN 19 19  CREATININE 1.10 1.13  CALCIUM 8.8 9.1   Liver Function Tests: No results found for this basename: AST, ALT, ALKPHOS, BILITOT, PROT, ALBUMIN,  in the last 168 hours CBC:   Recent Labs Lab 07/12/12 1844 07/15/12 1814 07/16/12 0720  WBC 11.1* 11.7* 11.9*  NEUTROABS 8.9*  --  8.8*  HGB 16.4 13.5 14.4  HCT 47.6 38.2* 41.9  MCV 86.9 83.0 85.3  PLT 217 186 213   Coagulation:   Recent Labs Lab 07/12/12 1844  LABPROT 14.0  INR 1.09   Cardiac Enzymes: No results found for this basename: CKTOTAL, CKMB, CKMBINDEX, TROPONINI,  in the last 168 hours Urinalysis: No results found for this basename: COLORURINE, APPERANCEUR, LABSPEC, PHURINE, GLUCOSEU, HGBUR, BILIRUBINUR, KETONESUR, PROTEINUR, UROBILINOGEN, NITRITE, LEUKOCYTESUR,  in the last 168 hours Lipid Panel    Component Value Date/Time   CHOL 109 11/21/2011 0917   TRIG 106.0 11/21/2011 0917   HDL 48.90 11/21/2011 0917   CHOLHDL 2 11/21/2011 0917   VLDL 21.2 11/21/2011 0917   LDLCALC 39 11/21/2011 0917   HgbA1C  Lab Results  Component Value Date   HGBA1C 5.4  07/11/2011    Urine Drug Screen:   No results found for this basename: labopia,  cocainscrnur,  labbenz,  amphetmu,  thcu,  labbarb    Alcohol Level: No results found for this basename: ETH,  in the last 168 hours  CT of the brain  07/12/2012   1.  Intraventricular hemorrhage into the right lateral ventricle and to a lesser degree the left lateral ventricle and third ventricle.  No evidence of hydrocephalus at this time. 2.  No visible parenchymal hemorrhage or subarachnoid hemorrhage. 3.  Moderate generalized atrophy and moderate chronic microvascular ischemic changes of the white matter.    MRI of the brain 07/14/12 Intraventricular hemorrhage, unchanged from yesterday. No   hydrocephalus. Negative for acute infarct.   MRA of the brain  07/14/12 Mild atherosclerotic disease. No large vessel occlusion  EKG  sinus tachycardia 102  Therapy Recommendations   Physical Exam    GENERAL EXAM: Patient is in MILD DISTRESS FOR HA.  CARDIOVASCULAR: Regular rate and rhythm, no murmurs, no carotid bruits  NEUROLOGIC: MENTAL STATUS: awake, alert, language fluent, comprehension intact, naming intact CRANIAL NERVE: pupils equal and reactive to light, visual fields full to confrontation, extraocular muscles intact, no nystagmus, facial sensation and strength symmetric, uvula midline, shoulder shrug symmetric, tongue midline. MOTOR: normal bulk and tone, full strength in the BUE, BLE SENSORY: normal and symmetric to light touch COORDINATION: finger-nose-finger, fine finger movements, normal REFLEXES: deep tendon reflexes present and symmetric   ASSESSMENT [name] is a 72 y.o. male presenting with headache headache x 1 mo with worsening 3 days prior to admission. Imaging confirms a right greater than Left IVH, no IPH. Hemorrhage felt to be secondary to malignant hypertension.  On aspirin 81 mg orally every day and clopidogrel 75 mg orally every day prior to admission. Plavix added to aspirin following since left subclavian and left axillary stents placed 06/20/2012. Dr. Marjory Lies discussed with interventionalist - given large size of stents, ok to discontinue plavix at this time given IVH. Patient with resultant severe headache, nausea.    Malignant Hypertension, BP in ED 212/102  PAD w/ hx stenting LUE  Confusion per wife, likely side effect of narcotics given for pain control  Hospital day # 4  TREATMENT/PLAN  Continue aspirin 81mg  daily  Change BP control (goal SBP < 180)  Increase norvasc to 10 mg daily  depakote for headache - 1 gm IV load now followed by 500 ER daily  Continue IP monitoring  Annie Main, MSN, RN, ANVP-BC, ANP-BC, GNP-BC [facility] Pager: [phone] 07/16/2012 11:33 AM  I have personally obtained a history, examined the patient, evaluated imaging results, and formulated the assessment and plan of care. I agree with the above.  Delia Heady, MD

## 2012-07-16 NOTE — Progress Notes (Signed)
Dr. Robb Matar notified of results of troponin and BNP obtain at 1550.  No new orders received.  Will continue to monitor.

## 2012-07-17 DIAGNOSIS — R0989 Other specified symptoms and signs involving the circulatory and respiratory systems: Secondary | ICD-10-CM

## 2012-07-17 DIAGNOSIS — I619 Nontraumatic intracerebral hemorrhage, unspecified: Secondary | ICD-10-CM

## 2012-07-17 MED ORDER — DOXYCYCLINE HYCLATE 100 MG PO TABS: 100.0000 mg | ORAL_TABLET | Freq: Two times a day (BID) | ORAL | Status: AC

## 2012-07-17 MED ORDER — PREDNISONE 10 MG PO TABS: ORAL_TABLET | ORAL | Status: DC

## 2012-07-17 MED ORDER — TRAMADOL HCL 50 MG PO TABS: 100.0000 mg | ORAL_TABLET | Freq: Four times a day (QID) | ORAL | Status: DC | PRN

## 2012-07-17 MED ORDER — PREDNISONE 50 MG PO TABS
50.0000 mg | ORAL_TABLET | Freq: Every day | ORAL | Status: DC
  Filled 2012-07-17: qty 1

## 2012-07-17 MED ORDER — DIVALPROEX SODIUM ER 500 MG PO TB24: 500.0000 mg | ORAL_TABLET | Freq: Every day | ORAL | Status: DC

## 2012-07-17 MED ORDER — TIOTROPIUM BROMIDE MONOHYDRATE 18 MCG IN CAPS: 18.0000 ug | ORAL_CAPSULE | Freq: Every day | RESPIRATORY_TRACT | Status: DC

## 2012-07-17 MED ORDER — AMLODIPINE BESYLATE 10 MG PO TABS: 10.0000 mg | ORAL_TABLET | Freq: Every day | ORAL | Status: DC

## 2012-07-17 MED ORDER — DOXYCYCLINE HYCLATE 100 MG PO TABS
100.0000 mg | ORAL_TABLET | Freq: Two times a day (BID) | ORAL | Status: DC
  Administered 2012-07-17: 100 mg via ORAL
  Filled 2012-07-17 (×2): qty 1

## 2012-07-17 MED ORDER — ALBUTEROL SULFATE (5 MG/ML) 0.5% IN NEBU: 2.5000 mg | INHALATION_SOLUTION | RESPIRATORY_TRACT | Status: DC | PRN

## 2012-07-17 NOTE — Progress Notes (Signed)
Call placed to [facility] NP per patient request to find out what time he will be discharged Barnett Applebaum, RN BC, BSN, MSN

## 2012-07-17 NOTE — Progress Notes (Addendum)
[facility] PROGRESS NOTE  Assessment/Plan: *Intraventricular hemorrhage - per neurology.    Dyspnea due COPD exacerbation: - no cough or CP. He continue to sat > 90% on RA. - ct angio showed no PE or pulmonary infiltrate. - Rhonchi resolved. - cont spiriva, 7 day steroid tapered start 6 60 decrease 10 mg daily, albuterol PRN. 6 days of doxy. - will sign off    History of chronic CHF: - compensated. NO JVD orthopnea or HJ reflex.   Malignant hypertension - mildly high.    Code Status: full Family Communication: wife  Disposition Plan: inpatient   Procedures: Ct angio chest 3.23.2014: Negative for pulmonary embolus. No acute abnormality.  2. Postoperative changes CABG. 3. Emphysema.    Antibiotics:  none  HPI/Subjective: SOB improved  Objective: Filed Vitals:   07/17/12 0200 07/17/12 0600 07/17/12 0740 07/17/12 0900  BP: 143/64 147/56  112/50  Pulse: 82 75  75  Temp: 98.7 F (37.1 C) 98 F (36.7 C)  97.7 F (36.5 C)  TempSrc: Oral Oral  Oral  Resp: 18 18  18   Height:      Weight:      SpO2: 98% 96% 96% 98%    Intake/Output Summary (Last 24 hours) at 07/17/12 1039 Last data filed at 07/17/12 0305  Gross per 24 hour  Intake    560 ml  Output      0 ml  Net    560 ml   Filed Weights   07/12/12 2156 07/14/12 0500  Weight: 75.5 kg (166 lb 7.2 oz) 79 kg (174 lb 2.6 oz)    Exam:  General: Alert, awake, oriented x3, in no acute distress.  HEENT: No bruits, no goiter.  Heart: Regular rate and rhythm, without murmurs, rubs, gallops.  Lungs: Good air movement, clear to auscultation. Abdomen: Soft, nontender, nondistended, positive bowel sounds.  Neuro: Grossly intact, nonfocal.   Data Reviewed: Basic Metabolic Panel:  Recent Labs Lab 07/12/12 1844 07/15/12 1814 07/16/12 0720  NA 134* 128* 130*  K 4.7 4.0 4.4  CL 98 94* 95*  CO2 20 21 24   GLUCOSE 108* 99 97  BUN 22 19 19   CREATININE 1.44* 1.10 1.13  CALCIUM 10.0 8.8 9.1   Liver  Function Tests: No results found for this basename: AST, ALT, ALKPHOS, BILITOT, PROT, ALBUMIN,  in the last 168 hours No results found for this basename: LIPASE, AMYLASE,  in the last 168 hours No results found for this basename: AMMONIA,  in the last 168 hours CBC:  Recent Labs Lab 07/12/12 1844 07/15/12 1814 07/16/12 0720  WBC 11.1* 11.7* 11.9*  NEUTROABS 8.9*  --  8.8*  HGB 16.4 13.5 14.4  HCT 47.6 38.2* 41.9  MCV 86.9 83.0 85.3  PLT 217 186 213   Cardiac Enzymes:  Recent Labs Lab 07/16/12 1550 07/16/12 2009 07/17/12 0300  TROPONINI <0.30 <0.30 <0.30   BNP (last 3 results)  Recent Labs  07/21/11 0605 07/16/12 1550  PROBNP 8469.0* 9072.0*   CBG: No results found for this basename: GLUCAP,  in the last 168 hours  Recent Results (from the past 240 hour(s))  MRSA PCR SCREENING     Status: None   Collection Time    07/12/12  9:55 PM      Result Value Range Status   MRSA by PCR NEGATIVE  NEGATIVE Final   Comment:            The GeneXpert MRSA Assay (FDA     approved for NASAL  specimens     only), is one component of a     comprehensive MRSA colonization     surveillance program. It is not     intended to diagnose MRSA     infection nor to guide or     monitor treatment for     MRSA infections.     Studies: Dg Chest 2 View  07/15/2012  *RADIOLOGY REPORT*  Clinical Data: Shortness of breath.  CHEST - 2 VIEW  Comparison: 10/11/2011.  Findings: Stable surgical changes from bypass surgery.  The heart is normal in size.  The mediastinal and hilar contours are within normal limits and stable.  Stable tortuosity and calcification of the thoracic aorta.  There are bronchitic type lung changes suggesting bronchitis or interstitial pneumonitis.  No focal airspace consolidation.  Stable left pleural thickening. Vascular stents are noted.  IMPRESSION: Bronchitic type lung changes suggesting bronchitis.  No focal infiltrates.   Original Report Authenticated By: Rudie Meyer,  M.D.    Ct Head Wo Contrast  07/15/2012  *RADIOLOGY REPORT*  Clinical Data: Follow up intraventricular hemorrhage  CT HEAD WITHOUT CONTRAST  Technique:  Contiguous axial images were obtained from the base of the skull through the vertex without contrast.  Comparison: 07/12/2012  Findings: There is no mass effect or midline shift.  Again noted intraventricular hemorrhage into the right lateral ventricle slight decrease in size from prior exam.  The previous left intraventricular hemorrhage is not identified.  No acute infarction.  No mass lesion is noted on this unenhanced scan.  Stable cerebral atrophy and chronic white matter disease.  IMPRESSION: Again noted right intraventricular hemorrhage with mild improvement from prior exam.  The previous left intraventricular hemorrhage is not identified.  No mass effect or midline shift.   Original Report Authenticated By: Natasha Mead, M.D.    Ct Angio Chest Pe W/cm &/or Wo Cm  07/15/2012  *RADIOLOGY REPORT*  Clinical Data: Short of breath.  Chest pain.  Dyspnea. Pulmonary embolism.  CT ANGIOGRAPHY CHEST  Technique:  Multidetector CT imaging of the chest using the standard protocol during bolus administration of intravenous contrast. Multiplanar reconstructed images including MIPs were obtained and reviewed to evaluate the vascular anatomy.  Contrast: 80mL OMNIPAQUE IOHEXOL 350 MG/ML SOLN  Comparison: Chest radiograph 07/15/2012.Chest CT 07/01/2011.  Findings: Technically adequate study.  Negative for pulmonary embolus.  CABG.  Incidental imaging the upper abdomen demonstrates nonspecific retroperitoneal stranding and cholecystectomy.  Aortic and branch vessel atherosclerosis is present.  Left subclavian artery stents.  Trace left pleural effusion.  No adenopathy.  There are no aggressive osseous lesions.  The lungs show emphysema and dependent atelectasis.  No pneumothorax or airspace disease.  Thoracic spine DISH and spondylosis.  IMPRESSION:  1.  Negative for  pulmonary embolus.  No acute abnormality. 2.  Postoperative changes CABG. 3.  Emphysema.   Original Report Authenticated By: Andreas Newport, M.D.     Scheduled Meds: . amLODipine  10 mg Oral Daily  . atorvastatin  20 mg Oral q1800  . carvedilol  12.5 mg Oral BID WC  . divalproex  500 mg Oral Daily  . doxycycline (VIBRAMYCIN) IV  100 mg Intravenous Q12H  . levothyroxine  200 mcg Oral Q breakfast  . lisinopril  20 mg Oral Daily  . methylPREDNISolone (SOLU-MEDROL) injection  60 mg Intravenous Q12H  . pantoprazole  40 mg Oral QHS  . senna-docusate  1 tablet Oral BID  . tiotropium  18 mcg Inhalation Daily   Continuous Infusions:  Marinda Elk  [facility] Pager [phone]. If 8PM-8AM, please contact night-coverage at [email or web address], password [facility] 07/17/2012, 10:39 AM  LOS: 5 days

## 2012-07-17 NOTE — Progress Notes (Signed)
Stroke Team Progress Note  HISTORY [name] is an 72 y.o. male history of hypertension, hyperlipidemia, coronary artery disease with CABG, left subclavian artery stent placement and COPD, presenting with a history of persistent headache over about one month with marked increase in headache severity over the past 3 days. He describes the pain as 8-9/10 in intensity and worse with lying down. There is no associated nausea and he said no visual changes. He's also had no speech changes nor focal weakness. CT scan of his head showed right intraventricular hemorrhage involving the right lateral ventricle and extending into the left lateral and third ventricles. There was no evidence of hydrocephalus. NIH stroke scale is 0. Patient has been on aspirin and Plavix daily. Patient was not a TPA candidate secondary to unclear time of onset and hemorrhage. He was admitted for further evaluation and treatment.  SUBJECTIVE His wife is at the bedside. He reports improved headache overall. Wife reports confusion is better but not clear.  OBJECTIVE Most recent Vital Signs: Filed Vitals:   07/17/12 0200 07/17/12 0600 07/17/12 0740 07/17/12 0900  BP: 143/64 147/56  112/50  Pulse: 82 75  75  Temp: 98.7 F (37.1 C) 98 F (36.7 C)  97.7 F (36.5 C)  TempSrc: Oral Oral  Oral  Resp: 18 18  18   Height:      Weight:      SpO2: 98% 96% 96% 98%   CBG (last 3)  No results found for this basename: GLUCAP,  in the last 72 hours  IV Fluid Intake:      MEDICATIONS  . amLODipine  10 mg Oral Daily  . atorvastatin  20 mg Oral q1800  . carvedilol  12.5 mg Oral BID WC  . divalproex  500 mg Oral Daily  . doxycycline  100 mg Oral Q12H  . levothyroxine  200 mcg Oral Q breakfast  . lisinopril  20 mg Oral Daily  . pantoprazole  40 mg Oral QHS  . [START ON 07/18/2012] predniSONE  50 mg Oral Q breakfast  . senna-docusate  1 tablet Oral BID  . tiotropium  18 mcg Inhalation Daily   PRN:  acetaminophen, acetaminophen,  albuterol, HYDROmorphone (DILAUDID) injection, labetalol, ondansetron (ZOFRAN) IV, traMADol  Diet:  Cardiac thin liquids Activity:  As tolerated DVT Prophylaxis:  SCDs   CLINICALLY SIGNIFICANT STUDIES Basic Metabolic Panel:   Recent Labs Lab 07/15/12 1814 07/16/12 0720  NA 128* 130*  K 4.0 4.4  CL 94* 95*  CO2 21 24  GLUCOSE 99 97  BUN 19 19  CREATININE 1.10 1.13  CALCIUM 8.8 9.1   Liver Function Tests: No results found for this basename: AST, ALT, ALKPHOS, BILITOT, PROT, ALBUMIN,  in the last 168 hours CBC:   Recent Labs Lab 07/12/12 1844 07/15/12 1814 07/16/12 0720  WBC 11.1* 11.7* 11.9*  NEUTROABS 8.9*  --  8.8*  HGB 16.4 13.5 14.4  HCT 47.6 38.2* 41.9  MCV 86.9 83.0 85.3  PLT 217 186 213   Coagulation:   Recent Labs Lab 07/12/12 1844  LABPROT 14.0  INR 1.09   Cardiac Enzymes:   Recent Labs Lab 07/16/12 1550 07/16/12 2009 07/17/12 0300  TROPONINI <0.30 <0.30 <0.30   Urinalysis: No results found for this basename: COLORURINE, APPERANCEUR, LABSPEC, PHURINE, GLUCOSEU, HGBUR, BILIRUBINUR, KETONESUR, PROTEINUR, UROBILINOGEN, NITRITE, LEUKOCYTESUR,  in the last 168 hours Lipid Panel    Component Value Date/Time   CHOL 109 11/21/2011 0917   TRIG 106.0 11/21/2011 0917   HDL  48.90 11/21/2011 0917   CHOLHDL 2 11/21/2011 0917   VLDL 21.2 11/21/2011 0917   LDLCALC 39 11/21/2011 0917   HgbA1C  Lab Results  Component Value Date   HGBA1C 5.4 07/11/2011    Urine Drug Screen:   No results found for this basename: labopia,  cocainscrnur,  labbenz,  amphetmu,  thcu,  labbarb    Alcohol Level: No results found for this basename: ETH,  in the last 168 hours  CT of the brain  07/12/2012   1.  Intraventricular hemorrhage into the right lateral ventricle and to a lesser degree the left lateral ventricle and third ventricle.  No evidence of hydrocephalus at this time. 2.  No visible parenchymal hemorrhage or subarachnoid hemorrhage. 3.  Moderate generalized atrophy and  moderate chronic microvascular ischemic changes of the white matter.    MRI of the brain 07/14/12 Intraventricular hemorrhage, unchanged from yesterday. No  hydrocephalus. Negative for acute infarct.   MRA of the brain  07/14/12 Mild atherosclerotic disease. No large vessel occlusion  EKG  sinus tachycardia 102  Therapy Recommendations no needs  GENERAL EXAM: Patient is in MILD DISTRESS FOR HA. CARDIOVASCULAR: Regular rate and rhythm, no murmurs, no carotid bruits NEUROLOGIC: MENTAL STATUS: awake, alert, language fluent, comprehension intact, naming intact CRANIAL NERVE: pupils equal and reactive to light, visual fields full to confrontation, extraocular muscles intact, no nystagmus, facial sensation and strength symmetric, uvula midline, shoulder shrug symmetric, tongue midline. MOTOR: normal bulk and tone, full strength in the BUE, BLE SENSORY: normal and symmetric to light touch COORDINATION: finger-nose-finger, fine finger movements, normal REFLEXES: deep tendon reflexes present and symmetric  ASSESSMENT [name] is a 72 y.o. male presenting with headache headache x 1 mo with worsening 3 days prior to admission. Imaging confirms a right greater than Left IVH, no IPH. Hemorrhage felt to be secondary to malignant hypertension.  On aspirin 81 mg orally every day and clopidogrel 75 mg orally every day prior to admission. Plavix added to aspirin following since left subclavian and left axillary stents placed 06/20/2012. Dr. Marjory Lies discussed with interventionalist - given large size of stents, ok to discontinue plavix at this time given IVH. Patient with resultant severe headache, nausea.    Severe headache, started on depakote 07/16/2012. Still receiving dilaudid, but only once since midnight and dose prior to that at 1851.  Malignant Hypertension, BP in ED 212/102. norvasc increased to 10 mg on 3/24. BPs trending down. 2 doses labetalol yesterday, last dose at noon, none  since.  PAD w/ hx stenting LUE  Confusion per wife, likely side effect of narcotics given for pain control  Hospital day # 5  TREATMENT/PLAN  Continue aspirin 81mg  daily  Continue depakote daily, ultram prn breakthrough pain.  Follow up BP as an OP  Discharge home today  Annie Main, MSN, RN, ANVP-BC, ANP-BC, [facility] Pager: [phone] 07/17/2012 11:16 AM  I have personally obtained a history, examined the patient, evaluated imaging results, and formulated the assessment and plan of care. I agree with the above.  Delia Heady, MD

## 2012-07-17 NOTE — Progress Notes (Signed)
Stroke Discharge Summary  Patient ID: [name]   MRN: [id number]      DOB: Jan 07, 1941  Date of Admission: 07/12/2012 Date of Discharge: 07/17/2012  Attending Physician:  Darcella Cheshire, MD, Stroke MD  Consulting Physician(s):   Treatment Team:  Sibyl Parr, MD Ricke Hey, MD Patient's PCP:  [name], MD  Discharge Diagnoses:  Principal Problem:   Intraventricular hemorrhage - right greater than Left IVH, no IPH, secondary to malignant hypertension Active Problems:   Malignant hypertension   Headache secondary to hemorrhage   Dyspnea   History of chronic CHF   HTN (hypertension)   PAD -  left subclavian and left axillary stents placed 06/20/2012.    Confusion secondary to narcotic use  BMI: Body mass index is 25.71 kg/(m^2).  Past Medical History  Diagnosis Date  . Thyroid disease     hypo  . Venous insufficiency   . Peripheral arterial disease     Previous left lower extremity stenting by Dr. Evette Cristal  . Tobacco abuse   . COPD (chronic obstructive pulmonary disease)   . Arthritis   . Hypothyroidism   . Subclavian artery stenosis, left 05/2012    Status post stenting of the ostium and self-expanding stent placement to the left axillary artery   Past Surgical History  Procedure Laterality Date  . Cholecystectomy    . Angioplasty / stenting femoral    . Coronary artery bypass graft  07/11/2011    Procedure: CORONARY ARTERY BYPASS GRAFTING (CABG);  Surgeon: [name], MD;  Location: [facility] OR;  Service: Open Heart Surgery;  Laterality: N/A;  Times 3. On Pump. Using right greater saphenous vein and left internal mammary artery.   . Cardiac catheterization  2/14    [facility]  . Subclavian artery stent  2014    x2     Medication List    STOP taking these medications       aspirin 81 MG tablet     clopidogrel 75 MG tablet  Commonly known as:  PLAVIX      TAKE these medications       albuterol (5 MG/ML) 0.5% nebulizer solution  Commonly known as:   PROVENTIL  Take 0.5 mLs (2.5 mg total) by nebulization every 4 (four) hours as needed for wheezing or shortness of breath.     amLODipine 10 MG tablet  Commonly known as:  NORVASC  Take 1 tablet (10 mg total) by mouth daily.     atorvastatin 20 MG tablet  Commonly known as:  LIPITOR  Take 1 tablet (20 mg total) by mouth daily at 6 PM.     carvedilol 3.125 MG tablet  Commonly known as:  COREG  Take 1 tablet (3.125 mg total) by mouth 2 (two) times daily with a meal.     divalproex 500 MG 24 hr tablet  Commonly known as:  DEPAKOTE ER  Take 1 tablet (500 mg total) by mouth daily.     doxycycline 100 MG tablet  Commonly known as:  VIBRA-TABS  Take 1 tablet (100 mg total) by mouth every 12 (twelve) hours.     HYDROcodone-acetaminophen 5-325 MG per tablet  Commonly known as:  NORCO/VICODIN  Take 1 tablet by mouth every 6 (six) hours as needed for pain.     levothyroxine 200 MCG tablet  Commonly known as:  SYNTHROID, LEVOTHROID  Take 200 mcg by mouth daily.     lisinopril 5 MG tablet  Commonly known as:  PRINIVIL,ZESTRIL  Take 1 tablet (5 mg total) by mouth daily.     predniSONE 10 MG tablet  Commonly known as:  DELTASONE  Taper - 50 mg Wed, 40mg  Thr, 30mg  Fri, 20mg  Sat, 10mg  Sun  Start taking on:  07/18/2012     tiotropium 18 MCG inhalation capsule  Commonly known as:  SPIRIVA  Place 1 capsule (18 mcg total) into inhaler and inhale daily.     traMADol 50 MG tablet  Commonly known as:  ULTRAM  Take 2 tablets (100 mg total) by mouth every 6 (six) hours as needed for pain (use as first line for headache, prior to narcotics (hydrocodone)).       LABORATORY STUDIES CBC    Component Value Date/Time   WBC 11.9* 07/16/2012 0720   WBC 6.7 06/01/2012 1339   RBC 4.91 07/16/2012 0720   RBC 5.43 06/01/2012 1339   HGB 14.4 07/16/2012 0720   HCT 41.9 07/16/2012 0720   PLT 213 07/16/2012 0720   MCV 85.3 07/16/2012 0720   MCH 29.3 07/16/2012 0720   MCH 29.5 06/01/2012 1339   MCHC 34.4  07/16/2012 0720   MCHC 33.7 06/01/2012 1339   RDW 13.7 07/16/2012 0720   RDW 13.6 06/01/2012 1339   LYMPHSABS 1.7 07/16/2012 0720   LYMPHSABS 2.3 06/01/2012 1339   MONOABS 1.3* 07/16/2012 0720   EOSABS 0.1 07/16/2012 0720   EOSABS 0.3 06/01/2012 1339   BASOSABS 0.0 07/16/2012 0720   BASOSABS 0.1 06/01/2012 1339   CMP    Component Value Date/Time   NA 130* 07/16/2012 0720   NA 144 06/01/2012 1339   K 4.4 07/16/2012 0720   CL 95* 07/16/2012 0720   CO2 24 07/16/2012 0720   GLUCOSE 97 07/16/2012 0720   GLUCOSE 71 06/01/2012 1339   BUN 19 07/16/2012 0720   BUN 32* 06/01/2012 1339   CREATININE 1.13 07/16/2012 0720   CREATININE 1.02 04/22/2011 1507   CALCIUM 9.1 07/16/2012 0720   PROT 5.8* 11/21/2011 0917   ALBUMIN 3.5 11/21/2011 0917   AST 18 11/21/2011 0917   ALT 12 11/21/2011 0917   ALKPHOS 64 11/21/2011 0917   BILITOT 0.4 11/21/2011 0917   GFRNONAA 64* 07/16/2012 0720   GFRAA 74* 07/16/2012 0720   COAGS Lab Results  Component Value Date   INR 1.09 07/12/2012   INR 1.03 06/20/2012   INR 1.2 06/01/2012   Lipid Panel    Component Value Date/Time   CHOL 109 11/21/2011 0917   TRIG 106.0 11/21/2011 0917   HDL 48.90 11/21/2011 0917   CHOLHDL 2 11/21/2011 0917   VLDL 21.2 11/21/2011 0917   LDLCALC 39 11/21/2011 0917   HgbA1C  Lab Results  Component Value Date   HGBA1C 5.4 07/11/2011   Cardiac Panel (last 3 results)  Recent Labs  07/16/12 1550 07/16/12 2009 07/17/12 0300  TROPONINI <0.30 <0.30 <0.30   Urinalysis    Component Value Date/Time   COLORURINE YELLOW 07/11/2011 0020   APPEARANCEUR CLOUDY* 07/11/2011 0020   LABSPEC 1.010 07/11/2011 0020   PHURINE 6.0 07/11/2011 0020   GLUCOSEU NEGATIVE 07/11/2011 0020   HGBUR NEGATIVE 07/11/2011 0020   BILIRUBINUR NEGATIVE 07/11/2011 0020   KETONESUR NEGATIVE 07/11/2011 0020   PROTEINUR NEGATIVE 07/11/2011 0020   UROBILINOGEN 1.0 07/11/2011 0020   NITRITE NEGATIVE 07/11/2011 0020   LEUKOCYTESUR NEGATIVE 07/11/2011 0020   Urine Drug Screen  No results found for  this basename: labopia, cocainscrnur, labbenz, amphetmu, thcu, labbarb    Alcohol Level No results found for  this basename: eth   SIGNIFICANT DIAGNOSTIC STUDIES CT of the brain 07/12/2012 1. Intraventricular hemorrhage into the right lateral ventricle and to a lesser degree the left lateral ventricle and third ventricle. No evidence of hydrocephalus at this time. 2. No visible parenchymal hemorrhage or subarachnoid hemorrhage. 3. Moderate generalized atrophy and moderate chronic microvascular ischemic changes of the white matter.  MRI of the brain 07/14/12 Intraventricular hemorrhage, unchanged from yesterday. No hydrocephalus. Negative for acute infarct.  MRA of the brain 07/14/12 Mild atherosclerotic disease. No large vessel occlusion  EKG sinus tachycardia 102    History of Present Illness   [name] is an 72 y.o. male history of hypertension, hyperlipidemia, coronary artery disease with CABG, left subclavian artery stent placement and COPD, presenting with a history of persistent headache over about one month with marked increase in headache severity over the past 3 days. He describes the pain as 8-9/10 in intensity and worse with lying down. There is no associated nausea and he said no visual changes. He's also had no speech changes nor focal weakness. CT scan of his head showed right intraventricular hemorrhage involving the right lateral ventricle and extending into the left lateral and third ventricles. There was no evidence of hydrocephalus. NIH stroke scale is 0. Patient has been on aspirin and Plavix daily. Patient was not a TPA candidate secondary to unclear time of onset and hemorrhage. He was admitted for further evaluation and treatment.  Hospital Course IVH was felt to be secondary to malignant hypertension. On aspirin 81 mg orally every day and clopidogrel 75 mg orally every day prior to admission. Plavix added to aspirin since left subclavian and left axillary stents placed  06/20/2012. Given large size of stents, after discussion with interventionalist, felt ok to discontinue plavix given IVH. Patient was continued on aspirin 81mg  daily.  For headache, was started on depakote 07/16/2012 with good results. Can have prn ultram. Confusion per wif experience in hospital likely side effect of narcotics (morphine and dilaudid) given for pain control  Malignant Hypertension, BP in ED 212/102. norvasc increased to 10 mg on 3/24. BPs trending down.   Patient with continued symptoms of severe headache. Physical therapy, occupational therapy and speech therapy evaluated patient. No follow up therapy recommended.  Discharge Exam  Blood pressure 138/52, pulse 74, temperature 98.5 F (36.9 C), temperature source Oral, resp. rate 18, height 5\' 9"  (1.753 m), weight 79 kg (174 lb 2.6 oz), SpO2 95.00%.  Patient is in MILD DISTRESS FOR HA.  CARDIOVASCULAR:  Regular rate and rhythm, no murmurs, no carotid bruits  NEUROLOGIC:  MENTAL STATUS: awake, alert, language fluent, comprehension intact, naming intact  CRANIAL NERVE: pupils equal and reactive to light, visual fields full to confrontation, extraocular muscles intact, no nystagmus, facial sensation and strength symmetric, uvula midline, shoulder shrug symmetric, tongue midline.  MOTOR: normal bulk and tone, full strength in the BUE, BLE  SENSORY: normal and symmetric to light touch  COORDINATION: finger-nose-finger, fine finger movements, normal  REFLEXES: deep tendon reflexes present and symmetric  Discharge Diet   Cardiac thin liquids  Discharge Plan    Disposition:  Home with wife   aspirin 81 mg orally every day for secondary stroke prevention.  Ongoing risk factor control by Primary Care Physician. Risk factor recommendations:  Hypertension target range 130-140/70-80   Follow-up [name], MD in 1 month.  Follow-up with Dr. Delia Heady in 2 months.  25 minutes were spent preparing  discharge.  Signed Annie Main, AVNP,  ANP-BC, GNP-BC Stroke Center Nurse Practitioner 07/17/2012, 5:00 PM  I have personally examined this patient, reviewed pertinent data and developed the plan of care. I agree with above.  Delia Heady, MD

## 2012-07-18 NOTE — Care Management Note (Signed)
    Page 1 of 1   07/18/2012     8:27:56 AM   CARE MANAGEMENT NOTE 07/18/2012  Patient:  [name], [name]   Account Number:  [id number]  Date Initiated:  07/16/2012  Documentation initiated by:  [facility]  Subjective/Objective Assessment:   admitted with intraventricular  hemorrhage     Action/Plan:   PT/OT evals-no follow up recommended   Anticipated DC Date:  07/17/2012   Anticipated DC Plan:  HOME/SELF CARE      DC Planning Services  CM consult      Choice offered to / List presented to:             Status of service:  Completed, signed off Medicare Important Message given?   (If response is "NO", the following Medicare IM given date fields will be blank) Date Medicare IM given:   Date Additional Medicare IM given:    Discharge Disposition:  HOME/SELF CARE  Per UR Regulation:  Reviewed for med. necessity/level of care/duration of stay  If discussed at Long Length of Stay Meetings, dates discussed:    Comments:  07/17/12 Patient states that he does not have pharmacy coverage. Gave patient discount coupons for spiriva and a pharmacy discount card. Told wife about assistance applications and coupons online. Jacquelynn Cree RN, BSN, CCM

## 2012-07-23 NOTE — Discharge Summary (Signed)
Micki Riley, MD Physician Signed Neurology Progress Notes Service date: 07/17/2012 5:00 PM  Stroke Discharge Summary    Patient ID:      korbin mapps                         MRN: [id number]                                                         DOB: [birth year]  Date of Admission: 07/12/2012 Date of Discharge: 07/17/2012  Attending Physician:  Darcella Cheshire, MD, Stroke MD  Consulting Physician(s):   Treatment Team:  Sibyl Parr, MD Ricke Hey, MD Patient's PCP:  [name], MD  Discharge Diagnoses:   Principal Problem:   Intraventricular hemorrhage - right greater than Left IVH, no IPH, secondary to malignant hypertension Active Problems:   Malignant hypertension   Headache secondary to hemorrhage   Dyspnea   History of chronic CHF   HTN (hypertension)   PAD -  left subclavian and left axillary stents placed 06/20/2012.     Confusion secondary to narcotic use  BMI: Body mass index is 25.71 kg/(m^2).    Past Medical History   Diagnosis  Date   .  Thyroid disease         hypo   .  Venous insufficiency     .  Peripheral arterial disease         Previous left lower extremity stenting by Dr. Evette Cristal   .  Tobacco abuse     .  COPD (chronic obstructive pulmonary disease)     .  Arthritis     .  Hypothyroidism     .  Subclavian artery stenosis, left  05/2012       Status post stenting of the ostium and self-expanding stent placement to the left axillary artery    Past Surgical History   Procedure  Laterality  Date   .  Cholecystectomy       .  Angioplasty / stenting femoral       .  Coronary artery bypass graft    07/11/2011       Procedure: CORONARY ARTERY BYPASS GRAFTING (CABG);  Surgeon: [name], MD;  Location: [facility] OR;  Service: Open Heart Surgery;  Laterality: N/A;  Times 3. On Pump. Using right greater saphenous vein and left internal mammary artery.    .  Cardiac catheterization    2/14       [facility]   .   Subclavian artery stent    2014       x2      Medication List    STOP taking these medications         aspirin 81 MG tablet      clopidogrel 75 MG tablet   Commonly known as:  PLAVIX      TAKE these medications           albuterol (5 MG/ML) 0.5% nebulizer solution   Commonly known as:  PROVENTIL   Take 0.5 mLs (2.5 mg total) by nebulization every 4 (four) hours as needed for wheezing or shortness of breath.         amLODipine 10 MG tablet   Commonly known as:  NORVASC   Take 1 tablet (10 mg total) by mouth daily.       atorvastatin 20 MG tablet   Commonly known as:  LIPITOR   Take 1 tablet (20 mg total) by mouth daily at 6 PM.        carvedilol 3.125 MG tablet   Commonly known as:  COREG   Take 1 tablet (3.125 mg total) by mouth 2 (two) times daily with a meal.        divalproex 500 MG 24 hr tablet   Commonly known as:  DEPAKOTE ER   Take 1 tablet (500 mg total) by mouth daily.      doxycycline 100 MG tablet   Commonly known as:  VIBRA-TABS   Take 1 tablet (100 mg total) by mouth every 12 (twelve) hours.      HYDROcodone-acetaminophen 5-325 MG per tablet   Commonly known as:  NORCO/VICODIN   Take 1 tablet by mouth every 6 (six) hours as needed for pain.        levothyroxine 200 MCG tablet   Commonly known as:  SYNTHROID, LEVOTHROID   Take 200 mcg by mouth daily.        lisinopril 5 MG tablet   Commonly known as:  PRINIVIL,ZESTRIL   Take 1 tablet (5 mg total) by mouth daily.        predniSONE 10 MG tablet   Commonly known as:  DELTASONE   Taper - 50 mg Wed, 40mg  Thr, 30mg  Fri, 20mg  Sat, 10mg  Sun   Start taking on:  07/18/2012        tiotropium 18 MCG inhalation capsule   Commonly known as:  SPIRIVA   Place 1 capsule (18 mcg total) into inhaler and inhale daily.        traMADol 50 MG tablet   Commonly known as:  ULTRAM   Take 2 tablets (100 mg total) by mouth every 6 (six) hours as needed for pain (use as first line for headache, prior to narcotics  (hydrocodone)).    LABORATORY STUDIES CBC   Component  Value  Date/Time     WBC  11.9*  07/16/2012 0720     WBC  6.7  06/01/2012 1339     RBC  4.91  07/16/2012 0720     RBC  5.43  06/01/2012 1339     HGB  14.4  07/16/2012 0720     HCT  41.9  07/16/2012 0720     PLT  213  07/16/2012 0720     MCV  85.3  07/16/2012 0720     MCH  29.3  07/16/2012 0720     MCH  29.5  06/01/2012 1339     MCHC  34.4  07/16/2012 0720     MCHC  33.7  06/01/2012 1339     RDW  13.7  07/16/2012 0720     RDW  13.6  06/01/2012 1339     LYMPHSABS  1.7  07/16/2012 0720     LYMPHSABS  2.3  06/01/2012 1339     MONOABS  1.3*  07/16/2012 0720     EOSABS  0.1  07/16/2012 0720     EOSABS  0.3  06/01/2012 1339     BASOSABS  0.0  07/16/2012  0720     BASOSABS  0.1  06/01/2012 1339    CMP   Component  Value  Date/Time     NA  130*  07/16/2012 0720     NA  144  06/01/2012 1339     K  4.4  07/16/2012 0720     CL  95*  07/16/2012 0720     CO2  24  07/16/2012 0720     GLUCOSE  97  07/16/2012 0720     GLUCOSE  71  06/01/2012 1339     BUN  19  07/16/2012 0720     BUN  32*  06/01/2012 1339     CREATININE  1.13  07/16/2012 0720     CREATININE  1.02  04/22/2011 1507     CALCIUM  9.1  07/16/2012 0720     PROT  5.8*  11/21/2011 0917     ALBUMIN  3.5  11/21/2011 0917     AST  18  11/21/2011 0917     ALT  12  11/21/2011 0917     ALKPHOS  64  11/21/2011 0917     BILITOT  0.4  11/21/2011 0917     GFRNONAA  64*  07/16/2012 0720     GFRAA  74*  07/16/2012 0720    COAGS Lab Results   Component  Value  Date     INR  1.09  07/12/2012     INR  1.03  06/20/2012     INR  1.2  06/01/2012    Lipid Panel   Component  Value  Date/Time     CHOL  109  11/21/2011 0917     TRIG  106.0  11/21/2011 0917     HDL  48.90  11/21/2011 0917     CHOLHDL  2  11/21/2011 0917     VLDL  21.2  11/21/2011 0917     LDLCALC  39  11/21/2011 0917    HgbA1C   Lab Results   Component  Value  Date     HGBA1C  5.4  07/11/2011    Cardiac Panel (last 3 results)  Recent Labs   07/16/12 1550   07/16/12 2009  07/17/12 0300   TROPONINI  <0.30  <0.30  <0.30    Urinalysis   Component  Value  Date/Time     COLORURINE  YELLOW  07/11/2011 0020     APPEARANCEUR  CLOUDY*  07/11/2011 0020     LABSPEC  1.010  07/11/2011 0020     PHURINE  6.0  07/11/2011 0020     GLUCOSEU  NEGATIVE  07/11/2011 0020     HGBUR  NEGATIVE  07/11/2011 0020     BILIRUBINUR  NEGATIVE  07/11/2011 0020     KETONESUR  NEGATIVE  07/11/2011 0020     PROTEINUR  NEGATIVE  07/11/2011 0020     UROBILINOGEN  1.0  07/11/2011 0020     NITRITE  NEGATIVE  07/11/2011 0020     LEUKOCYTESUR  NEGATIVE  07/11/2011 0020    Urine Drug Screen  No results found for this basename: labopia, cocainscrnur, labbenz, amphetmu, thcu, labbarb     Alcohol Level No results found for this basename: eth    SIGNIFICANT DIAGNOSTIC STUDIES CT of the brain 07/12/2012 1. Intraventricular hemorrhage into the right lateral ventricle and to a lesser degree the left lateral ventricle and third ventricle. No evidence of hydrocephalus at this time. 2. No visible parenchymal hemorrhage or subarachnoid hemorrhage. 3. Moderate  generalized atrophy and moderate chronic microvascular ischemic changes of the white matter.   MRI of the brain 07/14/12 Intraventricular hemorrhage, unchanged from yesterday. No hydrocephalus. Negative for acute infarct.   MRA of the brain 07/14/12 Mild atherosclerotic disease. No large vessel occlusion   EKG sinus tachycardia 102    History of Present Illness   [name] is an 72 y.o. male history of hypertension, hyperlipidemia, coronary artery disease with CABG, left subclavian artery stent placement and COPD, presenting with a history of persistent headache over about one month with marked increase in headache severity over the past 3 days. He describes the pain as 8-9/10 in intensity and worse with lying down. There is no associated nausea and he said no visual changes. He's also had no speech changes nor focal weakness. CT scan of his  head showed right intraventricular hemorrhage involving the right lateral ventricle and extending into the left lateral and third ventricles. There was no evidence of hydrocephalus. NIH stroke scale is 0. Patient has been on aspirin and Plavix daily. Patient was not a TPA candidate secondary to unclear time of onset and hemorrhage. He was admitted for further evaluation and treatment.  Hospital Course IVH was felt to be secondary to malignant hypertension. On aspirin 81 mg orally every day and clopidogrel 75 mg orally every day prior to admission. Plavix added to aspirin since left subclavian and left axillary stents placed 06/20/2012. Given large size of stents, after discussion with interventionalist, felt ok to discontinue plavix given IVH. Patient was continued on aspirin 81mg  daily.  For headache, was started on depakote 07/16/2012 with good results. Can have prn ultram. Confusion per wif experience in hospital likely side effect of narcotics (morphine and dilaudid) given for pain control  Malignant Hypertension, BP in ED 212/102. norvasc increased to 10 mg on 3/24. BPs trending down.   Patient with continued symptoms of severe headache. Physical therapy, occupational therapy and speech therapy evaluated patient. No follow up therapy recommended.  Discharge Exam  Blood pressure 138/52, pulse 74, temperature 98.5 F (36.9 C), temperature source Oral, resp. rate 18, height 5\' 9"  (1.753 m), weight 79 kg (174 lb 2.6 oz), SpO2 95.00%.  Patient is in MILD DISTRESS FOR HA.   CARDIOVASCULAR:   Regular rate and rhythm, no murmurs, no carotid bruits   NEUROLOGIC:   MENTAL STATUS: awake, alert, language fluent, comprehension intact, naming intact   CRANIAL NERVE: pupils equal and reactive to light, visual fields full to confrontation, extraocular muscles intact, no nystagmus, facial sensation and strength symmetric, uvula midline, shoulder shrug symmetric, tongue midline.   MOTOR: normal bulk and tone,  full strength in the BUE, BLE   SENSORY: normal and symmetric to light touch   COORDINATION: finger-nose-finger, fine finger movements, normal   REFLEXES: deep tendon reflexes present and symmetric  Discharge Diet   Cardiac thin liquids  Discharge Plan    Disposition:  Home with wife   aspirin 81 mg orally every day for secondary stroke prevention. Ongoing risk factor control by Primary Care Physician. Risk factor recommendations:  Hypertension target range 130-140/70-80  Follow-up [name], MD in 1 month. Follow-up with Dr. Delia Heady in 2 months.  25 minutes were spent preparing discharge.  Signed Annie Main, AVNP, ANP-BC, [facility] Stroke Center Nurse Practitioner 07/17/2012, 5:00 PM  I have personally examined this patient, reviewed pertinent data and developed the plan of care. I agree with above.  Delia Heady, MD  Revision History...     Date/Time  User Action   07/19/2012 10:19 PM Micki Riley, MD Sign   07/19/2012 6:09 PM Layne Benton, NP Sign  View Details Report

## 2012-07-24 HISTORY — PX: VENA CAVA FILTER PLACEMENT: SHX1085

## 2012-07-27 ENCOUNTER — Telehealth: Payer: Self-pay

## 2012-07-27 ENCOUNTER — Ambulatory Visit: Payer: Self-pay | Admitting: Cardiovascular Disease

## 2012-07-27 ENCOUNTER — Inpatient Hospital Stay: Payer: Self-pay | Admitting: Internal Medicine

## 2012-07-27 DIAGNOSIS — I824Y9 Acute embolism and thrombosis of unspecified deep veins of unspecified proximal lower extremity: Secondary | ICD-10-CM | POA: Diagnosis not present

## 2012-07-27 DIAGNOSIS — I2510 Atherosclerotic heart disease of native coronary artery without angina pectoris: Secondary | ICD-10-CM | POA: Diagnosis not present

## 2012-07-27 DIAGNOSIS — J4489 Other specified chronic obstructive pulmonary disease: Secondary | ICD-10-CM | POA: Diagnosis not present

## 2012-07-27 DIAGNOSIS — J449 Chronic obstructive pulmonary disease, unspecified: Secondary | ICD-10-CM | POA: Diagnosis not present

## 2012-07-27 DIAGNOSIS — I629 Nontraumatic intracranial hemorrhage, unspecified: Secondary | ICD-10-CM | POA: Diagnosis not present

## 2012-07-27 DIAGNOSIS — N189 Chronic kidney disease, unspecified: Secondary | ICD-10-CM | POA: Diagnosis not present

## 2012-07-27 DIAGNOSIS — M7989 Other specified soft tissue disorders: Secondary | ICD-10-CM | POA: Diagnosis not present

## 2012-07-27 DIAGNOSIS — Z7982 Long term (current) use of aspirin: Secondary | ICD-10-CM | POA: Diagnosis not present

## 2012-07-27 DIAGNOSIS — Z23 Encounter for immunization: Secondary | ICD-10-CM | POA: Diagnosis not present

## 2012-07-27 DIAGNOSIS — I10 Essential (primary) hypertension: Secondary | ICD-10-CM | POA: Diagnosis present

## 2012-07-27 DIAGNOSIS — Z9089 Acquired absence of other organs: Secondary | ICD-10-CM | POA: Diagnosis not present

## 2012-07-27 DIAGNOSIS — I771 Stricture of artery: Secondary | ICD-10-CM | POA: Diagnosis present

## 2012-07-27 DIAGNOSIS — Z7902 Long term (current) use of antithrombotics/antiplatelets: Secondary | ICD-10-CM | POA: Diagnosis not present

## 2012-07-27 DIAGNOSIS — Z951 Presence of aortocoronary bypass graft: Secondary | ICD-10-CM | POA: Diagnosis not present

## 2012-07-27 DIAGNOSIS — I739 Peripheral vascular disease, unspecified: Secondary | ICD-10-CM | POA: Diagnosis present

## 2012-07-27 DIAGNOSIS — E039 Hypothyroidism, unspecified: Secondary | ICD-10-CM | POA: Diagnosis not present

## 2012-07-27 DIAGNOSIS — I82409 Acute embolism and thrombosis of unspecified deep veins of unspecified lower extremity: Secondary | ICD-10-CM | POA: Diagnosis not present

## 2012-07-27 DIAGNOSIS — Z87891 Personal history of nicotine dependence: Secondary | ICD-10-CM | POA: Diagnosis not present

## 2012-07-27 DIAGNOSIS — Z8673 Personal history of transient ischemic attack (TIA), and cerebral infarction without residual deficits: Secondary | ICD-10-CM | POA: Diagnosis not present

## 2012-07-27 DIAGNOSIS — M129 Arthropathy, unspecified: Secondary | ICD-10-CM | POA: Diagnosis present

## 2012-07-27 LAB — BASIC METABOLIC PANEL
Anion Gap: 6 — ABNORMAL LOW (ref 7–16)
Calcium, Total: 8.9 mg/dL (ref 8.5–10.1)
Chloride: 103 mmol/L (ref 98–107)
Potassium: 4.7 mmol/L (ref 3.5–5.1)
Sodium: 136 mmol/L (ref 136–145)

## 2012-07-27 LAB — HEMOGLOBIN: HGB: 15.1 g/dL (ref 13.0–18.0)

## 2012-07-27 NOTE — Telephone Encounter (Signed)
[facility] u/s called to say pt has total occlusion of RLE and needs to talk with Dr. Kirke Corin I gave her his pager # She will call me back should she not be able to reach him

## 2012-07-27 NOTE — Telephone Encounter (Signed)
Pt wife called and states pt is having cramp in right leg, states pt had a brain hemorrage from Plavix after stent was placed, states pt has been home for 2 weeks, and cramp started this a.m. She states he is off all blood thinners. Please advise

## 2012-07-27 NOTE — Telephone Encounter (Signed)
Wife was informed Scheduled for today at 1:00 pm at [facility] STAT Wife and pt will await results at [facility] until we contact her

## 2012-07-27 NOTE — Telephone Encounter (Signed)
I spoke with [name] at [facility] u/s dept She confirms she was able to reach Dr. Kirke Corin and he is taking care of this

## 2012-07-27 NOTE — Telephone Encounter (Signed)
I paged Dr. Kirke Corin at 1100 and he returned my call at 1103 He gave order for "STAT lower extremity venous doppler on right lower extremity" VO Dr. Adline Mango, RN

## 2012-07-27 NOTE — Telephone Encounter (Signed)
I spoke with Dr. Kirke Corin who says he did address this with pt and sent him to ER

## 2012-07-27 NOTE — Telephone Encounter (Signed)
Wife says pt was recently d/c from [facility] for dx:IVH secondary to malignant hypertension For this reason he was told to d/c Plavix and ASA at d/c Wife says pt has been very inactive at home and has developed pain in right calf behind knee. This is causing him to limp Says this has been going on x 3 days She is concerned he may have a blood clot I told her I would page Dr. Kirke Corin and call her back Understanding verb

## 2012-07-28 LAB — CBC WITH DIFFERENTIAL/PLATELET
Basophil %: 1.3 %
Eosinophil #: 0.3 10*3/uL (ref 0.0–0.7)
Eosinophil %: 3.3 %
HCT: 42.2 % (ref 40.0–52.0)
HGB: 14.2 g/dL (ref 13.0–18.0)
Lymphocyte %: 25.6 %
MCHC: 33.6 g/dL (ref 32.0–36.0)
Monocyte #: 1.4 x10 3/mm — ABNORMAL HIGH (ref 0.2–1.0)
Monocyte %: 12.9 %
Neutrophil #: 6 10*3/uL (ref 1.4–6.5)
Neutrophil %: 56.9 %
Platelet: 150 10*3/uL (ref 150–440)
RBC: 4.8 10*6/uL (ref 4.40–5.90)
RDW: 14.2 % (ref 11.5–14.5)
WBC: 10.6 10*3/uL (ref 3.8–10.6)

## 2012-07-28 LAB — BASIC METABOLIC PANEL
BUN: 27 mg/dL — ABNORMAL HIGH (ref 7–18)
Calcium, Total: 8.4 mg/dL — ABNORMAL LOW (ref 8.5–10.1)
Chloride: 107 mmol/L (ref 98–107)
Co2: 27 mmol/L (ref 21–32)
EGFR (African American): >60
EGFR (Non-African Amer.): 58 — ABNORMAL LOW
Glucose: 83 mg/dL (ref 65–99)
Potassium: 4.9 mmol/L (ref 3.5–5.1)

## 2012-07-28 LAB — PROTIME-INR
INR: 1.3
Prothrombin Time: 16.2 secs — ABNORMAL HIGH (ref 11.5–14.7)

## 2012-08-06 ENCOUNTER — Other Ambulatory Visit: Payer: Self-pay | Admitting: *Deleted

## 2012-08-06 DIAGNOSIS — M79609 Pain in unspecified limb: Secondary | ICD-10-CM | POA: Diagnosis not present

## 2012-08-06 DIAGNOSIS — I872 Venous insufficiency (chronic) (peripheral): Secondary | ICD-10-CM | POA: Diagnosis not present

## 2012-08-06 DIAGNOSIS — M7989 Other specified soft tissue disorders: Secondary | ICD-10-CM | POA: Diagnosis not present

## 2012-08-06 DIAGNOSIS — I87099 Postthrombotic syndrome with other complications of unspecified lower extremity: Secondary | ICD-10-CM | POA: Diagnosis not present

## 2012-08-06 MED ORDER — HYDROCODONE-ACETAMINOPHEN 5-325 MG PO TABS: 1.0000 | ORAL_TABLET | Freq: Four times a day (QID) | ORAL | Status: DC | PRN

## 2012-08-06 NOTE — Telephone Encounter (Signed)
Last filled 07/18/12

## 2012-08-06 NOTE — Telephone Encounter (Signed)
rx called to pharmacy 

## 2012-08-06 NOTE — Telephone Encounter (Signed)
Ok to refill #40, 0 refills 

## 2012-08-13 ENCOUNTER — Ambulatory Visit (INDEPENDENT_AMBULATORY_CARE_PROVIDER_SITE_OTHER): Payer: Medicare Other | Admitting: Family Medicine

## 2012-08-13 ENCOUNTER — Encounter: Payer: Self-pay | Admitting: Family Medicine

## 2012-08-13 VITALS — BP 120/64 | HR 94 | Temp 97.6°F | Ht 69.0 in | Wt 159.0 lb

## 2012-08-13 DIAGNOSIS — I619 Nontraumatic intracerebral hemorrhage, unspecified: Secondary | ICD-10-CM | POA: Diagnosis not present

## 2012-08-13 DIAGNOSIS — E039 Hypothyroidism, unspecified: Secondary | ICD-10-CM | POA: Diagnosis not present

## 2012-08-13 DIAGNOSIS — I82401 Acute embolism and thrombosis of unspecified deep veins of right lower extremity: Secondary | ICD-10-CM

## 2012-08-13 DIAGNOSIS — I615 Nontraumatic intracerebral hemorrhage, intraventricular: Secondary | ICD-10-CM

## 2012-08-13 DIAGNOSIS — I82409 Acute embolism and thrombosis of unspecified deep veins of unspecified lower extremity: Secondary | ICD-10-CM

## 2012-08-13 DIAGNOSIS — I10 Essential (primary) hypertension: Secondary | ICD-10-CM

## 2012-08-13 HISTORY — DX: Acute embolism and thrombosis of unspecified deep veins of right lower extremity: I82.401

## 2012-08-13 NOTE — Patient Instructions (Addendum)
F/u 3 months 

## 2012-08-13 NOTE — Progress Notes (Signed)
[facility] at [facility] [address] [place] [address] Phone: [phone] Fax: [phone]  Date:  08/13/2012   Name:  [name]   DOB:  April 07, 1941   MRN:  [id number] Gender: male Age: 72 y.o.  Primary Physician:  [name], MD  Evaluating MD: [name], MD   Chief Complaint: Follow-up   History of Present Illness:  [name] is a 72 y.o. pleasant patient who presents with the following:  F/u s/p ICH and s/p large R DVT, d/c 4/14 at [facility] for DVT and placed a greenfield filter. He is having a lot of pain in the right leg. Requiring about 2 vicodin a day now.   Headaches are completely gone. On Depakote - f/u with neurology is in July.  Large R LE DVT, IVC filter at [facility] 2 weeks ago. Common femoral vein to popliteal, s/p IVC placement  ICH. Managed conservatively as below.  Trying to walk some now, as much as he is able. BP is stable and under control.  Hospital Discharge: 72 y.o. male history of hypertension, hyperlipidemia, coronary artery disease with CABG, left subclavian artery stent placement and COPD, presenting with a history of persistent headache over about one month with marked increase in headache severity over the past 3 days. He describes the pain as 8-9/10 in intensity and worse with lying down. There is no associated nausea and he said no visual changes. He's also had no speech changes nor focal weakness. CT scan of his head showed right intraventricular hemorrhage involving the right lateral ventricle and extending into the left lateral and third ventricles. There was no evidence of hydrocephalus. NIH stroke scale is 0. Patient has been on aspirin and Plavix daily. Patient was not a TPA candidate secondary to unclear time of onset and hemorrhage. He was admitted for further evaluation and treatment.  Hospital Course IVH was felt to be secondary to malignant hypertension. On aspirin 81 mg orally every day and clopidogrel 75 mg  orally every day prior to admission. Plavix added to aspirin since left subclavian and left axillary stents placed 06/20/2012. Given large size of stents, after discussion with interventionalist, felt ok to discontinue plavix given IVH. Patient was continued on aspirin 81mg  daily.  For headache, was started on depakote 07/16/2012 with good results. Can have prn ultram. Confusion per wif experience in hospital likely side effect of narcotics (morphine and dilaudid) given for pain control  Malignant Hypertension, BP in ED 212/102. norvasc increased to 10 mg on 3/24. BPs trending down.   Patient with continued symptoms of severe headache. Physical therapy, occupational therapy and speech therapy evaluated patient. No follow up therapy recommended.   Patient Active Problem List  Diagnosis  . HYPOTHYROIDISM NOS  . HYPERCHOLESTEROLEMIA  . TOBACCO ABUSE  . ABDOMINAL AORTIC ANEURYSM  . UNSPECIFIED PERIPHERAL VASCULAR DISEASE  . COPD (chronic obstructive pulmonary disease)  . FATIGUE  . PROSTATE SPECIFIC ANTIGEN, ELEVATED  . BPH (benign prostatic hyperplasia)  . Cerumen impaction  . Carotid bruit  . Subclavian arterial stenosis  . CAD (coronary artery disease), native coronary artery  . Ischemic cardiomyopathy  . Peripheral arterial disease  . Intraventricular hemorrhage  . History of chronic CHF  . HTN (hypertension)    Past Medical History  Diagnosis Date  . Thyroid disease     hypo  . Venous insufficiency   . Peripheral arterial disease     Previous left lower extremity stenting by Dr. Evette Cristal  . Tobacco  abuse   . COPD (chronic obstructive pulmonary disease)   . Arthritis   . Hypothyroidism   . Subclavian artery stenosis, left 05/2012    Status post stenting of the ostium and self-expanding stent placement to the left axillary artery    Past Surgical History  Procedure Laterality Date  . Cholecystectomy    . Angioplasty / stenting femoral    . Coronary artery bypass graft   07/11/2011    Procedure: CORONARY ARTERY BYPASS GRAFTING (CABG);  Surgeon: [name], MD;  Location: [facility] OR;  Service: Open Heart Surgery;  Laterality: N/A;  Times 3. On Pump. Using right greater saphenous vein and left internal mammary artery.   . Cardiac catheterization  2/14    [facility]  . Subclavian artery stent  2014    x2    History   Social History  . Marital Status: Married    Spouse Name: N/A    Number of Children: 2  . Years of Education: N/A   Occupational History  . Retired     Comptroller   Social History Main Topics  . Smoking status: Former Smoker -- 1.00 packs/day for 50 years    Types: Cigarettes    Quit date: 04/27/2011  . Smokeless tobacco: Never Used  . Alcohol Use: No  . Drug Use: No  . Sexually Active: Not Currently   Other Topics Concern  . Not on file   Social History Narrative   Exercsiing 3 times a week.    Moderate diet control.    O living will, no HCPOA.    Family History  Problem Relation Age of Onset  . Alcohol abuse Father   . Cirrhosis Father   . Hypothyroidism Sister     No Known Allergies  Medication list has been reviewed and updated.  Outpatient Prescriptions Prior to Visit  Medication Sig Dispense Refill  . amLODipine (NORVASC) 10 MG tablet Take 1 tablet (10 mg total) by mouth daily.  30 tablet  2  . atorvastatin (LIPITOR) 20 MG tablet Take 1 tablet (20 mg total) by mouth daily at 6 PM.  30 tablet  3  . carvedilol (COREG) 3.125 MG tablet Take 1 tablet (3.125 mg total) by mouth 2 (two) times daily with a meal.  60 tablet  3  . divalproex (DEPAKOTE ER) 500 MG 24 hr tablet Take 1 tablet (500 mg total) by mouth daily.  30 tablet  1  . HYDROcodone-acetaminophen (NORCO/VICODIN) 5-325 MG per tablet Take 1 tablet by mouth every 6 (six) hours as needed for pain.  40 tablet  0  . levothyroxine (SYNTHROID, LEVOTHROID) 200 MCG tablet Take 200 mcg by mouth daily.      Marland Kitchen lisinopril (PRINIVIL,ZESTRIL) 5 MG tablet Take 1 tablet (5  mg total) by mouth daily.  30 tablet  3  . traMADol (ULTRAM) 50 MG tablet Take 2 tablets (100 mg total) by mouth every 6 (six) hours as needed for pain (use as first line for headache, prior to narcotics (hydrocodone)).  30 tablet  2  . albuterol (PROVENTIL) (5 MG/ML) 0.5% nebulizer solution Take 0.5 mLs (2.5 mg total) by nebulization every 4 (four) hours as needed for wheezing or shortness of breath.  20 mL  1  . tiotropium (SPIRIVA) 18 MCG inhalation capsule Place 1 capsule (18 mcg total) into inhaler and inhale daily.  30 capsule  1  . predniSONE (DELTASONE) 10 MG tablet Taper - 50 mg Wed, 40mg  Thr, 30mg  Fri, 20mg  Sat, 10mg   Sun  15 tablet  0   No facility-administered medications prior to visit.    Review of Systems:  Leg pain, some sob, no chest pain. No headache.   Physical Examination: BP 120/64  Pulse 94  Temp(Src) 97.6 F (36.4 C) (Oral)  Ht 5\' 9"  (1.753 m)  Wt 159 lb (72.122 kg)  BMI 23.47 kg/m2  SpO2 95%  Ideal Body Weight: Weight in (lb) to have BMI = 25: 168.9   GEN: WDWN, NAD, Non-toxic, A & O x 3 HEENT: Atraumatic, Normocephalic. Neck supple. No masses, No LAD. Ears and Nose: No external deformity. CV: RRR, No M/G/R. No JVD. No thrill. No extra heart sounds. PULM: CTA B, no wheezes, crackles, rhonchi. No retractions. No resp. distress. No accessory muscle use. EXTR: No c/c/e NEURO Normal gait.  PSYCH: Normally interactive. Conversant. Not depressed or anxious appearing.  Calm demeanor.    Assessment and Plan:  Unspecified hypothyroidism - Plan: TSH  Right leg DVT  Intraventricular hemorrhage  HTN (hypertension)   Headache gone. No edema. R leg pain from DVT significant. Prn vicodin. IVC filter. No anticoagulation. BP improved and stable. Cont ASA 81 mg only - neuro notes and hosp notes reviewed.   Recheck in 3 months.  F/u with cards and neuro is upcoming, too.  Orders Today:  Orders Placed This Encounter  Procedures  . TSH    Updated  Medication List: (Includes new medications, updates to list, dose adjustments) Meds ordered this encounter  Medications  . aspirin 81 MG tablet    Sig: Take 81 mg by mouth daily.    Medications Discontinued: Medications Discontinued During This Encounter  Medication Reason  . predniSONE (DELTASONE) 10 MG tablet Error  . traMADol (ULTRAM) 50 MG tablet Error      Signed, Micahel Omlor T. Adison Reifsteck, MD 08/13/2012 9:08 AM

## 2012-08-23 DIAGNOSIS — E785 Hyperlipidemia, unspecified: Secondary | ICD-10-CM | POA: Diagnosis not present

## 2012-08-23 DIAGNOSIS — I824Y9 Acute embolism and thrombosis of unspecified deep veins of unspecified proximal lower extremity: Secondary | ICD-10-CM | POA: Diagnosis not present

## 2012-08-23 DIAGNOSIS — M79609 Pain in unspecified limb: Secondary | ICD-10-CM | POA: Diagnosis not present

## 2012-08-23 DIAGNOSIS — I87099 Postthrombotic syndrome with other complications of unspecified lower extremity: Secondary | ICD-10-CM | POA: Diagnosis not present

## 2012-08-24 ENCOUNTER — Other Ambulatory Visit: Payer: Self-pay | Admitting: *Deleted

## 2012-08-24 NOTE — Telephone Encounter (Signed)
Last filled 08/06/12

## 2012-08-26 NOTE — Telephone Encounter (Signed)
Ok to refill #40, 0 refills

## 2012-08-27 MED ORDER — HYDROCODONE-ACETAMINOPHEN 5-325 MG PO TABS: 1.0000 | ORAL_TABLET | Freq: Four times a day (QID) | ORAL | Status: DC | PRN

## 2012-08-27 NOTE — Telephone Encounter (Signed)
rx called to pharmacy 

## 2012-08-29 ENCOUNTER — Encounter (INDEPENDENT_AMBULATORY_CARE_PROVIDER_SITE_OTHER): Payer: Medicare Other

## 2012-08-29 DIAGNOSIS — I6523 Occlusion and stenosis of bilateral carotid arteries: Secondary | ICD-10-CM

## 2012-08-29 DIAGNOSIS — G458 Other transient cerebral ischemic attacks and related syndromes: Secondary | ICD-10-CM | POA: Diagnosis not present

## 2012-08-29 DIAGNOSIS — I6529 Occlusion and stenosis of unspecified carotid artery: Secondary | ICD-10-CM

## 2012-08-31 NOTE — Progress Notes (Signed)
Pt informed of carotid results.

## 2012-09-13 ENCOUNTER — Ambulatory Visit (INDEPENDENT_AMBULATORY_CARE_PROVIDER_SITE_OTHER): Payer: Medicare Other | Admitting: Family Medicine

## 2012-09-13 ENCOUNTER — Encounter: Payer: Self-pay | Admitting: Family Medicine

## 2012-09-13 ENCOUNTER — Ambulatory Visit
Admission: RE | Admit: 2012-09-13 | Discharge: 2012-09-13 | Disposition: A | Discharge status: Home / Self Care | Payer: Medicare Other | Source: Ambulatory Visit | Attending: Family Medicine | Admitting: Family Medicine

## 2012-09-13 VITALS — BP 116/56 | HR 54 | Temp 98.0°F | Wt 165.5 lb

## 2012-09-13 DIAGNOSIS — R0602 Shortness of breath: Secondary | ICD-10-CM

## 2012-09-13 DIAGNOSIS — J438 Other emphysema: Secondary | ICD-10-CM | POA: Diagnosis not present

## 2012-09-13 MED ORDER — IOHEXOL 350 MG/ML SOLN
100.0000 mL | Freq: Once | INTRAVENOUS | Status: AC | PRN
  Administered 2012-09-13: 100 mL via INTRAVENOUS

## 2012-09-13 MED ORDER — AMOXICILLIN-POT CLAVULANATE 875-125 MG PO TABS: 1.0000 | ORAL_TABLET | Freq: Two times a day (BID) | ORAL | Status: DC

## 2012-09-13 NOTE — Patient Instructions (Addendum)
See [name] about your referral before you leave today. We'll be in touch.  Take care.

## 2012-09-13 NOTE — Assessment & Plan Note (Signed)
D/w pt. EKG w/o changes.  I was concerned for PE.  CTA result noted.  CBC and CMET pending.  He doesn't look fluid overloaded. Will proceed with abx. I called pt about the CTA and he understood the plan.  See notes on labs.  Will have pt update [place] in near future.  He agrees.  Will notify PCP as FYI.

## 2012-09-13 NOTE — Progress Notes (Signed)
Recently with ICH and DVT with filter placed.  CABG last year.  On ASA now.  R leg pain is slowly improving, overall much improved.  Not on coumadin.  Still on depakote.  Former smoker.  H/o L arm stent prev for subclavian stenosis.   Fatigued.  Lightheaded with exertion.  He hasn't fallen. He feels diffusely weak.  This has been going on progressively for 1-2 weeks.  He had been off his feet due to the leg pain previously.  He gets tired going to the mailbox and back.  No CP. He gets SOB with significant exertion. He had a cough that he attributes to post nasal gtt, over the last week or so. No new med changes other than the meds after the ICH.   No fevers. Not SOB at rest. No edema in the L leg, is wearing compression stocking on the R leg.  Not SOB laying down. No black stools.  He has a mild frontal HA but this is not at all similar to the HA with the ICH.   Meds, vitals, and allergies reviewed.   ROS: See HPI.  Otherwise, noncontributory.  nad ncat Tm wnl Nasal exam stuffy Mmm rrr Ctab, no inc in wob abd soft R leg puffy, in compression stocking.  L leg no edema

## 2012-09-14 ENCOUNTER — Other Ambulatory Visit: Payer: Self-pay | Admitting: Family Medicine

## 2012-09-14 DIAGNOSIS — R7989 Other specified abnormal findings of blood chemistry: Secondary | ICD-10-CM

## 2012-09-14 LAB — COMPREHENSIVE METABOLIC PANEL
AST: 19 U/L (ref 0–37)
Albumin: 3.8 g/dL (ref 3.5–5.2)
BUN: 24 mg/dL — ABNORMAL HIGH (ref 6–23)
CO2: 27 mEq/L (ref 19–32)
Calcium: 9.1 mg/dL (ref 8.4–10.5)
Chloride: 106 mEq/L (ref 96–112)
Creatinine, Ser: 1.7 mg/dL — ABNORMAL HIGH (ref 0.4–1.5)
GFR: 43.29 mL/min — ABNORMAL LOW (ref >60.00)
Potassium: 5 mEq/L (ref 3.5–5.1)

## 2012-09-14 LAB — CBC WITH DIFFERENTIAL/PLATELET
Basophils Absolute: 0.1 10*3/uL (ref 0.0–0.1)
Basophils Relative: 1.7 % (ref 0.0–3.0)
Eosinophils Absolute: 0.5 10*3/uL (ref 0.0–0.7)
Hemoglobin: 14.1 g/dL (ref 13.0–17.0)
Lymphocytes Relative: 33.4 % (ref 12.0–46.0)
MCHC: 34 g/dL (ref 30.0–36.0)
Monocytes Relative: 10.6 % (ref 3.0–12.0)
Neutro Abs: 3.6 10*3/uL (ref 1.4–7.7)
Neutrophils Relative %: 47.6 % (ref 43.0–77.0)
RBC: 4.81 Mil/uL (ref 4.22–5.81)
RDW: 15.2 % — ABNORMAL HIGH (ref 11.5–14.6)

## 2012-09-18 ENCOUNTER — Other Ambulatory Visit (INDEPENDENT_AMBULATORY_CARE_PROVIDER_SITE_OTHER): Payer: Medicare Other

## 2012-09-18 DIAGNOSIS — R799 Abnormal finding of blood chemistry, unspecified: Secondary | ICD-10-CM | POA: Diagnosis not present

## 2012-09-18 DIAGNOSIS — R7989 Other specified abnormal findings of blood chemistry: Secondary | ICD-10-CM

## 2012-09-18 LAB — BASIC METABOLIC PANEL
Calcium: 8.8 mg/dL (ref 8.4–10.5)
GFR: 49.37 mL/min — ABNORMAL LOW (ref >60.00)
Glucose, Bld: 81 mg/dL (ref 70–99)
Potassium: 4.4 mEq/L (ref 3.5–5.1)
Sodium: 139 mEq/L (ref 135–145)

## 2012-10-05 ENCOUNTER — Other Ambulatory Visit: Payer: Self-pay

## 2012-10-05 ENCOUNTER — Encounter: Payer: Self-pay | Admitting: Cardiovascular Disease

## 2012-10-05 ENCOUNTER — Ambulatory Visit (INDEPENDENT_AMBULATORY_CARE_PROVIDER_SITE_OTHER): Payer: Medicare Other | Admitting: Cardiovascular Disease

## 2012-10-05 VITALS — BP 132/68 | HR 69 | Ht 69.0 in | Wt 166.0 lb

## 2012-10-05 DIAGNOSIS — I739 Peripheral vascular disease, unspecified: Secondary | ICD-10-CM | POA: Diagnosis not present

## 2012-10-05 DIAGNOSIS — I2510 Atherosclerotic heart disease of native coronary artery without angina pectoris: Secondary | ICD-10-CM

## 2012-10-05 DIAGNOSIS — I771 Stricture of artery: Secondary | ICD-10-CM

## 2012-10-05 DIAGNOSIS — I10 Essential (primary) hypertension: Secondary | ICD-10-CM | POA: Diagnosis not present

## 2012-10-05 DIAGNOSIS — R0789 Other chest pain: Secondary | ICD-10-CM

## 2012-10-05 NOTE — Assessment & Plan Note (Signed)
He underwent successful stenting of the left subclavian artery and left axillary artery. Continue aspirin daily. Duplex ultrasound in May showed patent stents. He has a normal left radial pulse as well.

## 2012-10-05 NOTE — Assessment & Plan Note (Signed)
Some of the discomfort in the right side is related to his recent extensive DVT. Unfortunately, anticoagulation is contraindicated due to intracranial hemorrhage this year. I advised him to continue using support stockings and elevate his legs. He does have symptoms suggestive of claudication with minimal walking. I suspect that he has underlying peripheral arterial disease. He did have previous endovascular intervention on the left side many years ago by Dr.Sanker.  I recommend an ABI and lower extremity arterial duplex.

## 2012-10-05 NOTE — Patient Instructions (Addendum)
Your physician has requested that you have an ankle brachial index (ABI). During this test an ultrasound and blood pressure cuff are used to evaluate the arteries that supply the arms and legs with blood. Allow thirty minutes for this exam. There are no restrictions or special instructions.  Your physician has requested that you have a lower extremity arterial exercise duplex. During this test, exercise and ultrasound are used to evaluate arterial blood flow in the legs. Allow one hour for this exam. There are no restrictions or special instructions.  Follow up in 6 months

## 2012-10-05 NOTE — Assessment & Plan Note (Signed)
The patient seems to be significantly deconditioned. I asked him to resume his physical activities.

## 2012-10-05 NOTE — Progress Notes (Signed)
Primary care physician: Dr. Ermalene Searing. Primary cardiologist: Dr. Mariah Milling  HPI  Mr. Kryder is a pleasant 72 year old gentleman who is here today for a followup visit regarding history of stenting of the left subclavian artery and left axillary artery.  The patient has multiple medical problems including hyperlipidemia, long smoking history for 60 years, COPD , stent to his left lower extremity for claudication done years ago by Dr. Evette Cristal,   carotid showed 40% to 59% bilateral disease, and atherosclerotic coronary artery disease status post CABG in March of 2013 with LIMA to the LAD, vein graft to the PDA, vein graft to the OM. He does have mild aortic valve stenosis with peak velocity 217 cm/s. He was seen in February, 2014 for worsening left subclavian artery stenosis with severe left arm claudication with a 54 mm pressure difference in both arms. I performed aortic arch angiography which showed severe ostial left subclavian artery stenosis and severe left proximal axillary artery stenosis. He underwent successful self-expanding stent placement to the axillary artery balloon expandable stent to the ostial subclavian artery. There was possibly ostial left carotid artery stenosis as well. The procedure was done via the left femoral artery. He was seen after that in the office and was complaining of increased fatigue and dyspnea. I did a treadmill nuclear stress test which showed no evidence of ischemia. He was admitted to [facility] in March for her headache and was found to have a small intracranial hemorrhage which was suspected due to malignant hypertension and dual antiplatelet therapy. He was treated conservatively and discharged home off antiplatelet medications. He pulled over office shortly after discharge with right leg discomfort and swelling. Venous Doppler ultrasound showed extensive DVT. This was a spontanous DVT as there was no instrumentation or manual pressure on that side. Anticoagulation  was contraindicated and thus an IVC filter was placed. The patient had significant discomfort in his leg after that which gradually improved and now he is feeling better. However, he is not complaining of what seems to be a right calf claudication with minimal walking.    Allergies  Allergen Reactions  . Plavix (Clopidogrel Bisulfate)     Brain hemorrhage prev while on plavix     Current Outpatient Prescriptions on File Prior to Visit  Medication Sig Dispense Refill  . amLODipine (NORVASC) 10 MG tablet Take 1 tablet (10 mg total) by mouth daily.  30 tablet  2  . aspirin 81 MG tablet Take 81 mg by mouth daily.      Marland Kitchen atorvastatin (LIPITOR) 20 MG tablet Take 1 tablet (20 mg total) by mouth daily at 6 PM.  30 tablet  3  . carvedilol (COREG) 3.125 MG tablet Take 1 tablet (3.125 mg total) by mouth 2 (two) times daily with a meal.  60 tablet  3  . divalproex (DEPAKOTE ER) 500 MG 24 hr tablet Take 1 tablet (500 mg total) by mouth daily.  30 tablet  1  . levothyroxine (SYNTHROID, LEVOTHROID) 200 MCG tablet Take 200 mcg by mouth daily.      Marland Kitchen lisinopril (PRINIVIL,ZESTRIL) 5 MG tablet Take 1 tablet (5 mg total) by mouth daily.  30 tablet  3  . [DISCONTINUED] Fluticasone-Salmeterol (ADVAIR DISKUS) 250-50 MCG/DOSE AEPB Inhale 1 puff into the lungs 2 (two) times daily.  1 each  11  . [DISCONTINUED] pravastatin (PRAVACHOL) 40 MG tablet Take 1 tablet (40 mg total) by mouth daily.  30 tablet  6   No current facility-administered medications on file  prior to visit.     Past Medical History  Diagnosis Date  . Thyroid disease     hypo  . Venous insufficiency   . Peripheral arterial disease     Previous left lower extremity stenting by Dr. Evette Cristal  . Tobacco abuse   . COPD (chronic obstructive pulmonary disease)   . Arthritis   . Hypothyroidism   . Subclavian artery stenosis, left 05/2012    Status post stenting of the ostium and self-expanding stent placement to the left axillary artery  . Right  leg DVT 08/13/2012  . Intraventricular hemorrhage 07/12/2012     Past Surgical History  Procedure Laterality Date  . Cholecystectomy    . Angioplasty / stenting femoral    . Coronary artery bypass graft  07/11/2011    Procedure: CORONARY ARTERY BYPASS GRAFTING (CABG);  Surgeon: [name], MD;  Location: [facility] OR;  Service: Open Heart Surgery;  Laterality: N/A;  Times 3. On Pump. Using right greater saphenous vein and left internal mammary artery.   . Cardiac catheterization  2/14    [facility]  . Subclavian artery stent  2014    x2  . Vena cava filter placement  07/2012    Removable     Family History  Problem Relation Age of Onset  . Alcohol abuse Father   . Cirrhosis Father   . Hypothyroidism Sister      History   Social History  . Marital Status: Married    Spouse Name: N/A    Number of Children: 2  . Years of Education: N/A   Occupational History  . Retired     Comptroller   Social History Main Topics  . Smoking status: Former Smoker -- 1.00 packs/day for 50 years    Types: Cigarettes    Quit date: 04/27/2011  . Smokeless tobacco: Never Used  . Alcohol Use: No  . Drug Use: No  . Sexually Active: Not Currently   Other Topics Concern  . Not on file   Social History Narrative   Exercsiing 3 times a week.    Moderate diet control.    O living will, no HCPOA.      PHYSICAL EXAM   BP 132/68  Pulse 69  Ht 5\' 9"  (1.753 m)  Wt 166 lb (75.297 kg)  BMI 24.5 kg/m2 Constitutional: He is oriented to person, place, and time. He appears well-developed and well-nourished. No distress.  HENT: No nasal discharge.  Head: Normocephalic and atraumatic.  Eyes: Pupils are equal and round. Right eye exhibits no discharge. Left eye exhibits no discharge.  Neck: Normal range of motion. Neck supple. No JVD present. No thyromegaly present. There is left carotid bruit. There is a bruit in the left subclavian artery area. Cardiovascular: Normal rate, regular rhythm,  normal heart sounds and. Exam reveals no gallop and no friction rub. There is 2/6 systolic ejection murmur at the aortic area.  Pulmonary/Chest: Effort normal and breath sounds normal. No stridor. No respiratory distress. He has no wheezes. He has no rales. He exhibits no tenderness.  Abdominal: Soft. Bowel sounds are normal. He exhibits no distension. There is no tenderness. There is no rebound and no guarding.  Musculoskeletal: Normal range of motion. He exhibits no edema and no tenderness.  Neurological: He is alert and oriented to person, place, and time. Coordination normal.  Skin: Skin is warm and dry. No rash noted. He is not diaphoretic. No erythema. No pallor.  Psychiatric: He has a normal mood  and affect. His behavior is normal. Judgment and thought content normal.  Vascular: Right radial pulse is normal. Left radial pulse is normal. Femoral pulse is: +1 on the right side and +2 on the left side. Distal pulses are not palpable.     EKG: Sinus  Rhythm  -Nonspecific ST depression   +   Nonspecific T-abnormality  -Nondiagnostic.   ABNORMAL    ASSESSMENT AND PLAN \

## 2012-11-07 ENCOUNTER — Encounter: Payer: Self-pay | Admitting: Cardiovascular Disease

## 2012-11-07 ENCOUNTER — Encounter (INDEPENDENT_AMBULATORY_CARE_PROVIDER_SITE_OTHER): Payer: Medicare Other

## 2012-11-07 DIAGNOSIS — I739 Peripheral vascular disease, unspecified: Secondary | ICD-10-CM

## 2012-11-07 DIAGNOSIS — I70219 Atherosclerosis of native arteries of extremities with intermittent claudication, unspecified extremity: Secondary | ICD-10-CM | POA: Diagnosis not present

## 2012-11-13 ENCOUNTER — Other Ambulatory Visit: Payer: Self-pay | Admitting: *Deleted

## 2012-11-13 MED ORDER — AMLODIPINE BESYLATE 10 MG PO TABS: 10.0000 mg | ORAL_TABLET | Freq: Every day | ORAL | Status: DC

## 2012-11-13 NOTE — Telephone Encounter (Signed)
Refilled Amlodipine sent to [facility] pharmacy.

## 2012-11-14 ENCOUNTER — Encounter: Payer: Self-pay | Admitting: Nurse Practitioner

## 2012-11-14 ENCOUNTER — Ambulatory Visit (INDEPENDENT_AMBULATORY_CARE_PROVIDER_SITE_OTHER): Payer: Medicare Other | Admitting: Nurse Practitioner

## 2012-11-14 ENCOUNTER — Other Ambulatory Visit: Payer: Self-pay | Admitting: *Deleted

## 2012-11-14 VITALS — BP 145/67 | HR 75 | Ht 70.0 in | Wt 166.0 lb

## 2012-11-14 DIAGNOSIS — I615 Nontraumatic intracerebral hemorrhage, intraventricular: Secondary | ICD-10-CM

## 2012-11-14 DIAGNOSIS — I739 Peripheral vascular disease, unspecified: Secondary | ICD-10-CM

## 2012-11-14 DIAGNOSIS — I619 Nontraumatic intracerebral hemorrhage, unspecified: Secondary | ICD-10-CM | POA: Diagnosis not present

## 2012-11-14 DIAGNOSIS — I10 Essential (primary) hypertension: Secondary | ICD-10-CM

## 2012-11-14 MED ORDER — ATORVASTATIN CALCIUM 20 MG PO TABS: 20.0000 mg | ORAL_TABLET | Freq: Every day | ORAL | Status: DC

## 2012-11-14 NOTE — Telephone Encounter (Signed)
Refilled Atorvastatin sent to [facility].

## 2012-11-14 NOTE — Progress Notes (Signed)
[facility]  PATIENT: [name] DOB: [birth year]   HISTORY FROM: patient, chart REASON FOR VISIT: stroke follow up  HISTORY OF PRESENT ILLNESS:  UPDATE 11/14/12:(LL): patient returns to office for follow up.  He has had no headaches since discharge and has had no neurological problems.  He has been on Aspirin 81mg  since discharge with no side effects.  He reports that he has arterial blockages in his abdomen, severe blockage in his right leg, and arterial blockage in his left leg.  Right leg ABI is severe and left ABI is moderate. Infrarenal fusiform AAA in the mid aorta measuring 3.1 cm x 2.5 cm. Severe bilateral common iliac artery stenosis, R>L. 0-49% left SFA stenosis. He has an appointment with Dr. Kirke Corin with [facility] on Friday to discuss treatment options.  [name] is an 72 y.o. male history of hypertension, hyperlipidemia, coronary artery disease with CABG 2013, left subclavian artery stent placement 05/2012, and COPD, presenting on 07/12/12 with a history of persistent headache over about one month with marked increase in headache severity over the past 3 days. He describes the pain as 8-9/10 in intensity and worse with lying down. There is no associated nausea and he said no visual changes. He's also had no speech changes nor focal weakness. CT scan of his head showed right intraventricular hemorrhage involving the right lateral ventricle and extending into the left lateral and third ventricles. There was no evidence of hydrocephalus. NIH stroke scale is 0. Patient had been on aspirin and Plavix daily.  He was admitted for further evaluation and treatment.    REVIEW OF SYSTEMS: Full 14 system review of systems performed and notable only for: constitutional: N/A  cardiovascular: N/A respiratory: N/A endocrine: N/A  ear/nose/throat: N/A  musculoskeletal: N/A skin: N/A genitourinary: N/A Gastrointestinal: N/A allergy/immunology:  N/A neurological: N/A sleep: N/A psychiatric: N/A   ALLERGIES: Allergies  Allergen Reactions  . Plavix (Clopidogrel Bisulfate)     Brain hemorrhage prev while on plavix    HOME MEDICATIONS: Outpatient Prescriptions Prior to Visit  Medication Sig Dispense Refill  . amLODipine (NORVASC) 10 MG tablet Take 1 tablet (10 mg total) by mouth daily.  30 tablet  3  . aspirin 81 MG tablet Take 81 mg by mouth daily.      Marland Kitchen atorvastatin (LIPITOR) 20 MG tablet Take 1 tablet (20 mg total) by mouth daily at 6 PM.  30 tablet  3  . carvedilol (COREG) 3.125 MG tablet Take 1 tablet (3.125 mg total) by mouth 2 (two) times daily with a meal.  60 tablet  3  . divalproex (DEPAKOTE ER) 500 MG 24 hr tablet Take 1 tablet (500 mg total) by mouth daily.  30 tablet  1  . levothyroxine (SYNTHROID, LEVOTHROID) 200 MCG tablet Take 200 mcg by mouth daily.      Marland Kitchen lisinopril (PRINIVIL,ZESTRIL) 5 MG tablet Take 1 tablet (5 mg total) by mouth daily.  30 tablet  3   No facility-administered medications prior to visit.    PAST MEDICAL HISTORY: Past Medical History  Diagnosis Date  . Thyroid disease     hypo  . Venous insufficiency   . Peripheral arterial disease     Previous left lower extremity stenting by Dr. Evette Cristal  . Tobacco abuse   . COPD (chronic obstructive pulmonary disease)   . Arthritis   . Hypothyroidism   . Subclavian artery stenosis, left 05/2012    Status post stenting of the ostium and self-expanding  stent placement to the left axillary artery  . Right leg DVT 08/13/2012  . Intraventricular hemorrhage 07/12/2012    PAST SURGICAL HISTORY: Past Surgical History  Procedure Laterality Date  . Cholecystectomy    . Angioplasty / stenting femoral    . Coronary artery bypass graft  07/11/2011    Procedure: CORONARY ARTERY BYPASS GRAFTING (CABG);  Surgeon: [name], MD;  Location: [facility] OR;  Service: Open Heart Surgery;  Laterality: N/A;  Times 3. On Pump. Using right greater saphenous vein and  left internal mammary artery.   . Cardiac catheterization  2/14    [facility]  . Subclavian artery stent  2014    x2  . Vena cava filter placement  07/2012    Removable    FAMILY HISTORY: Family History  Problem Relation Age of Onset  . Alcohol abuse Father   . Cirrhosis Father   . Hypothyroidism Sister     SOCIAL HISTORY: History   Social History  . Marital Status: Married    Spouse Name: N/A    Number of Children: 2  . Years of Education: N/A   Occupational History  . Retired     Comptroller   Social History Main Topics  . Smoking status: Former Smoker -- 1.00 packs/day for 50 years    Types: Cigarettes    Quit date: 04/27/2011  . Smokeless tobacco: Never Used  . Alcohol Use: No  . Drug Use: No  . Sexually Active: Not Currently   Other Topics Concern  . Not on file   Social History Narrative   Exercsiing 3 times a week.    Moderate diet control.    O living will, no HCPOA.     PHYSICAL EXAM  Filed Vitals:   11/14/12 1437  BP: 145/67  Pulse: 75  Height: 5\' 10"  (1.778 m)  Weight: 166 lb (75.297 kg)   Body mass index is 23.82 kg/(m^2).  Generalized: In no acute distress, pleasant Caucasian male  Neck: Supple, left carotid bruit  Cardiac: Regular rate rhythm, 2/6 systolic murmur with radiation to left clavicle   Pulmonary: Clear to auscultation bilaterally   Musculoskeletal: No deformity   Neurological examination   Mentation: Alert oriented to time, place, history taking, language fluent, and causual conversation  Cranial nerve II-XII: Pupils were equal round reactive to light extraocular movements were full, visual field were full on confrontational test. facial sensation and strength were normal. hearing was intact to finger rubbing bilaterally. Uvula tongue midline. head turning and shoulder shrug and were normal and symmetric.Tongue protrusion into cheek strength was normal. MOTOR: normal bulk and tone, full strength in the BUE, BLE, fine  finger movements normal, no pronator drift SENSORY: normal and symmetric to light touch, pinprick, temperature, vibration and proprioception COORDINATION: finger-nose-finger, heel-to-shin bilaterally, there was no truncal ataxia REFLEXES: Brachioradialis 2/2, biceps 2/2, triceps 2/2, patellar 2/2, Achilles 2/2, plantar responses were flexor bilaterally. GAIT/STATION: Rising up from seated position without assistance, normal stance, without trunk ataxia, moderate stride, good arm swing, smooth turning, able to perform tiptoe, and heel walking without difficulty.    DIAGNOSTIC DATA (LABS, IMAGING, TESTING) - I reviewed patient records, labs, notes, testing and imaging myself where available.  Lab Results  Component Value Date   WBC 7.7 09/13/2012   HGB 14.1 09/13/2012   HCT 41.4 09/13/2012   MCV 86.2 09/13/2012   PLT 232.0 09/13/2012      Component Value Date/Time   NA 139 09/18/2012 0928   NA 144  06/01/2012 1339   K 4.4 09/18/2012 0928   CL 107 09/18/2012 0928   CO2 24 09/18/2012 0928   GLUCOSE 81 09/18/2012 0928   GLUCOSE 71 06/01/2012 1339   BUN 22 09/18/2012 0928   BUN 32* 06/01/2012 1339   CREATININE 1.5 09/18/2012 0928   CREATININE 1.02 04/22/2011 1507   CALCIUM 8.8 09/18/2012 0928   PROT 6.7 09/13/2012 1719   ALBUMIN 3.8 09/13/2012 1719   AST 19 09/13/2012 1719   ALT 14 09/13/2012 1719   ALKPHOS 65 09/13/2012 1719   BILITOT 0.5 09/13/2012 1719   GFRNONAA 64* 07/16/2012 0720   GFRAA 74* 07/16/2012 0720   Lab Results  Component Value Date   CHOL 109 11/21/2011   HDL 48.90 11/21/2011   LDLCALC 39 11/21/2011   TRIG 106.0 11/21/2011   CHOLHDL 2 11/21/2011   Lab Results  Component Value Date   HGBA1C 5.4 07/11/2011   Lab Results  Component Value Date   VITAMINB12 304 09/29/2009   Lab Results  Component Value Date   TSH 0.20* 08/13/2012    SIGNIFICANT DIAGNOSTIC STUDIES  CT of the brain 07/12/2012 1. Intraventricular hemorrhage into the right lateral ventricle and to a lesser degree the left  lateral ventricle and third ventricle. No evidence of hydrocephalus at this time. 2. No visible parenchymal hemorrhage or subarachnoid hemorrhage. 3. Moderate generalized atrophy and moderate chronic microvascular ischemic changes of the white matter.  MRI of the brain 07/14/12 Intraventricular hemorrhage, unchanged from yesterday. No hydrocephalus. Negative for acute infarct.  MRA of the brain 07/14/12 Mild atherosclerotic disease. No large vessel occlusion  EKG sinus tachycardia 102  2D Echo 07/16/12: - The estimated ejection fraction was 50%. - Aortic valve: There was mild stenosis. - Mitral valve: Mild regurgitation. - Atrial septum: No defect or patent foramen ovale was identified. Carotid Duplex 08/29/12: Widely pain left subclavian axillary artery stents. Antegrade left vertebral artery flow, with moderate stenosis in the mid neck. Mild atherosclerosis in the left CCA. Brisk triphasic flow in the brachial arteries, with equal amplitude and pressures.  ASSESSMENT AND PLAN [name] is an 72 y.o. male history of hypertension, hyperlipidemia, coronary artery disease with CABG, left subclavian artery stent placement and COPD, presenting on 07/12/12 with IVH. IVH was felt to be secondary to malignant hypertension. On aspirin 81 mg orally every day and clopidogrel 75 mg orally every day prior to admission. Plavix added to aspirin since left subclavian and left axillary stents placed 06/20/2012. Patient was continued on aspirin 81mg  daily. For headache, was started on depakote 07/16/2012 with good results.  Headache resolved during hospital admission.  Continue aspirin 81 mg orally every day  for secondary stroke prevention and maintain strict control of hypertension with blood pressure goal below 130/90, diabetes with hemoglobin A1c goal below 6.5% and lipids with LDL cholesterol goal below 100 mg/dL.   Stop Depakote, no longer having headaches. Followup in 3 months.   Rosemaria Inabinet NP-C 11/14/2012,  2:42 PM  [facility] [address], [address] [place], [place] [address] [phone]  I have personally examined this patient, reviewed pertinent data, developed plan of care and discussed with patient and agree with above.  Delia Heady, MD

## 2012-11-14 NOTE — Patient Instructions (Addendum)
Continue aspirin 81 mg orally every day  for secondary stroke prevention and maintain strict control of hypertension with blood pressure goal below 130/90, diabetes with hemoglobin A1c goal below 6.5% and lipids with LDL cholesterol goal below 100 mg/dL.   Stop Depakote.  Followup in 3 months.  STROKE/TIA INSTRUCTIONS SMOKING Cigarette smoking nearly doubles your risk of having a stroke & is the single most alterable risk factor  If you smoke or have smoked in the last 12 months, you are advised to quit smoking for your health.  Most of the excess cardiovascular risk related to smoking disappears within a year of stopping.  Ask you doctor about anti-smoking medications  [place] Quit Line: 1-800-QUIT NOW  Free Smoking Cessation Classes ([phone]  CHOLESTEROL Know your levels; limit fat & cholesterol in your diet  Lab Results  Component Value Date   CHOL 109 11/21/2011   HDL 48.90 11/21/2011   LDLCALC 39 11/21/2011   TRIG 106.0 11/21/2011   CHOLHDL 2 11/21/2011      Many patients benefit from treatment even if their cholesterol is at goal.  Goal: Total Cholesterol less than 160  Goal:  LDL less than 100  Goal:  HDL greater than 40  Goal:  Triglycerides less than 150  BLOOD PRESSURE American Stroke Association blood pressure target is less that 120/80 mm/Hg  Your discharge blood pressure is:  BP: 145/67 mmHg  Monitor your blood pressure  Limit your salt and alcohol intake  Many individuals will require more than one medication for high blood pressure  DIABETES (A1c is a blood sugar average for last 3 months) Goal A1c is under 7% (A1c is blood sugar average for last 3 months)  Diabetes: No known diagnosis of diabetes    Lab Results  Component Value Date   HGBA1C 5.4 07/11/2011    Your A1c can be lowered with medications, healthy diet, and exercise.  Check your blood sugar as directed by your physician  Call your physician if you experience unexplained or low blood sugars.   PHYSICAL ACTIVITY/REHABILITATION Goal is 30 minutes at least 4 days per week    Activity decreases your risk of heart attack and stroke and makes your heart stronger.  It helps control your weight and blood pressure; helps you relax and can improve your mood.  Participate in a regular exercise program.  Talk with your doctor about the best form of exercise for you (dancing, walking, swimming, cycling).  DIET/WEIGHT Goal is to maintain a healthy weight  Your height is:  Height: 5\' 10"  (177.8 cm) Your current weight is: Weight: 166 lb (75.297 kg) Your body Mass Index (BMI) is:  BMI (Calculated): 23.9  Following the type of diet specifically designed for you will help prevent another stroke.  Your goal Body Mass Index (BMI) is 19-24.  Healthy food habits can help reduce 3 risk factors for stroke:  High cholesterol, hypertension, and excess weight.

## 2012-11-16 ENCOUNTER — Encounter: Payer: Self-pay | Admitting: Cardiovascular Disease

## 2012-11-16 ENCOUNTER — Ambulatory Visit (INDEPENDENT_AMBULATORY_CARE_PROVIDER_SITE_OTHER): Payer: Medicare Other | Admitting: Cardiovascular Disease

## 2012-11-16 VITALS — BP 132/60 | HR 80 | Ht 69.0 in | Wt 167.5 lb

## 2012-11-16 DIAGNOSIS — I771 Stricture of artery: Secondary | ICD-10-CM

## 2012-11-16 DIAGNOSIS — I739 Peripheral vascular disease, unspecified: Secondary | ICD-10-CM

## 2012-11-16 NOTE — Assessment & Plan Note (Signed)
He did have previous endovascular intervention on the left side many years ago by Dr.Sanker.  He now has severe lifestyle limiting claudication involving mostly the right leg. ABI was severely reduced on the right side at 0.37 and moderately reduced on the left side at 0.55. He has evidence of cardiac disease worse on the right side as well as ostial and proximal SFA disease on the right side. He is clearly disabled from this and has not been able to resume his exercise. He did have previous small intracranial hemorrhage while he was on dual antiplatelet therapy. This again will be a risk if we decide to proceed with endovascular intervention. I discussed the option of treating this conservatively. However, with severity of his symptoms, I do think he would be to do much. He is willing to take the risk associated with this. He is going on a vacation to the [place] and wants to wait until after he comes back. I will plan on doing the procedure with Plavix with aspirin. I will bring him back in one month for reevaluation.

## 2012-11-16 NOTE — Assessment & Plan Note (Signed)
His left radial pulse is normal. He has residual discomfort in his hand which seems to be related to arthritis.

## 2012-11-16 NOTE — Patient Instructions (Addendum)
Continue same medications. Follow up in 1 month.  

## 2012-11-16 NOTE — Progress Notes (Signed)
Primary care physician: Dr. Ermalene Searing. Primary cardiologist: Dr. Mariah Milling  HPI  [name] is a pleasant 72 year old gentleman who is here today for a followup visit regarding history of stenting of the left subclavian artery and left axillary artery.  The patient has multiple medical problems including hyperlipidemia, long smoking history for 60 years, COPD , stent to his left lower extremity for claudication done years ago by Dr. Evette Cristal,   carotid showed 40% to 59% bilateral disease, and atherosclerotic coronary artery disease status post CABG in March of 2013 with LIMA to the LAD, vein graft to the PDA, vein graft to the OM. He does have mild aortic valve stenosis with peak velocity 217 cm/s. He was seen in February, 2014 for worsening left subclavian artery stenosis with severe left arm claudication with a 54 mm pressure difference in both arms. I performed aortic arch angiography which showed severe ostial left subclavian artery stenosis and severe left proximal axillary artery stenosis. He underwent successful self-expanding stent placement to the axillary artery balloon expandable stent to the ostial subclavian artery. There was possibly ostial left carotid artery stenosis as well. The procedure was done via the left femoral artery. He was seen after that in the office and was complaining of increased fatigue and dyspnea. I did a treadmill nuclear stress test which showed no evidence of ischemia. He was admitted to [facility] in March for her headache and was found to have a small intracranial hemorrhage which was suspected due to malignant hypertension and dual antiplatelet therapy. He was treated conservatively and discharged home off antiplatelet medications. He called our office shortly after discharge with right leg discomfort and swelling. Venous Doppler ultrasound showed extensive DVT. This was a spontanous DVT as there was no instrumentation or manual pressure on that side. Anticoagulation  was contraindicated and thus an IVC filter was placed. The patient had significant discomfort in his leg after that which gradually improved and now he is feeling better. He is using compression stockings.  During his last visit, he reported progressive bilateral leg discomfort worse on the right side with minimal walking which was suggestive of claudication. I did noninvasive evaluation which showed severe bilateral common iliac disease worse on the right side with SFA disease on the right side as well.  He is only able to walk a few steps before he has to stop. He has no rest pain.   Allergies  Allergen Reactions  . Plavix (Clopidogrel Bisulfate)     Brain hemorrhage prev while on plavix     Current Outpatient Prescriptions on File Prior to Visit  Medication Sig Dispense Refill  . amLODipine (NORVASC) 10 MG tablet Take 1 tablet (10 mg total) by mouth daily.  30 tablet  3  . aspirin 81 MG tablet Take 81 mg by mouth daily.      Marland Kitchen atorvastatin (LIPITOR) 20 MG tablet Take 1 tablet (20 mg total) by mouth daily at 6 PM.  90 tablet  3  . carvedilol (COREG) 3.125 MG tablet Take 1 tablet (3.125 mg total) by mouth 2 (two) times daily with a meal.  60 tablet  3  . levothyroxine (SYNTHROID, LEVOTHROID) 200 MCG tablet Take 200 mcg by mouth daily.      Marland Kitchen lisinopril (PRINIVIL,ZESTRIL) 5 MG tablet Take 1 tablet (5 mg total) by mouth daily.  30 tablet  3  . [DISCONTINUED] Fluticasone-Salmeterol (ADVAIR DISKUS) 250-50 MCG/DOSE AEPB Inhale 1 puff into the lungs 2 (two) times daily.  1 each  11  . [DISCONTINUED] pravastatin (PRAVACHOL) 40 MG tablet Take 1 tablet (40 mg total) by mouth daily.  30 tablet  6   No current facility-administered medications on file prior to visit.     Past Medical History  Diagnosis Date  . Thyroid disease     hypo  . Venous insufficiency   . Peripheral arterial disease     Previous left lower extremity stenting by Dr. Evette Cristal  . Tobacco abuse   . COPD (chronic obstructive  pulmonary disease)   . Arthritis   . Hypothyroidism   . Subclavian artery stenosis, left 05/2012    Status post stenting of the ostium and self-expanding stent placement to the left axillary artery  . Right leg DVT 08/13/2012  . Intraventricular hemorrhage 07/12/2012     Past Surgical History  Procedure Laterality Date  . Cholecystectomy    . Angioplasty / stenting femoral    . Coronary artery bypass graft  07/11/2011    Procedure: CORONARY ARTERY BYPASS GRAFTING (CABG);  Surgeon: [name], MD;  Location: [facility] OR;  Service: Open Heart Surgery;  Laterality: N/A;  Times 3. On Pump. Using right greater saphenous vein and left internal mammary artery.   . Cardiac catheterization  2/14    [facility]  . Subclavian artery stent  2014    x2  . Vena cava filter placement  07/2012    Removable     Family History  Problem Relation Age of Onset  . Alcohol abuse Father   . Cirrhosis Father   . Hypothyroidism Sister      History   Social History  . Marital Status: Married    Spouse Name: N/A    Number of Children: 2  . Years of Education: N/A   Occupational History  . Retired     Comptroller   Social History Main Topics  . Smoking status: Former Smoker -- 1.00 packs/day for 50 years    Types: Cigarettes    Quit date: 04/27/2011  . Smokeless tobacco: Never Used  . Alcohol Use: No  . Drug Use: No  . Sexually Active: Not Currently   Other Topics Concern  . Not on file   Social History Narrative   Exercsiing 3 times a week.    Moderate diet control.    O living will, no HCPOA.      PHYSICAL EXAM   BP 132/60  Pulse 80  Ht 5\' 9"  (1.753 m)  Wt 167 lb 8 oz (75.978 kg)  BMI 24.72 kg/m2 Constitutional: He is oriented to person, place, and time. He appears well-developed and well-nourished. No distress.  HENT: No nasal discharge.  Head: Normocephalic and atraumatic.  Eyes: Pupils are equal and round. Right eye exhibits no discharge. Left eye exhibits no  discharge.  Neck: Normal range of motion. Neck supple. No JVD present. No thyromegaly present. There is left carotid bruit. There is a bruit in the left subclavian artery area. Cardiovascular: Normal rate, regular rhythm, normal heart sounds and. Exam reveals no gallop and no friction rub. There is 2/6 systolic ejection murmur at the aortic area.  Pulmonary/Chest: Effort normal and breath sounds normal. No stridor. No respiratory distress. He has no wheezes. He has no rales. He exhibits no tenderness.  Abdominal: Soft. Bowel sounds are normal. He exhibits no distension. There is no tenderness. There is no rebound and no guarding.  Musculoskeletal: Normal range of motion. He exhibits no edema and no tenderness.  Neurological: He is alert and  oriented to person, place, and time. Coordination normal.  Skin: Skin is warm and dry. No rash noted. He is not diaphoretic. No erythema. No pallor.  Psychiatric: He has a normal mood and affect. His behavior is normal. Judgment and thought content normal.  Vascular: Right radial pulse is normal. Left radial pulse is normal. Femoral pulse is: +1 on the right side and +2 on the left side. Distal pulses are not palpable.      ASSESSMENT AND PLAN

## 2012-11-19 ENCOUNTER — Encounter: Payer: Self-pay | Admitting: Family Medicine

## 2012-11-19 ENCOUNTER — Ambulatory Visit (INDEPENDENT_AMBULATORY_CARE_PROVIDER_SITE_OTHER): Payer: Medicare Other | Admitting: Family Medicine

## 2012-11-19 VITALS — BP 138/82 | HR 76 | Temp 97.7°F | Ht 69.0 in | Wt 167.5 lb

## 2012-11-19 DIAGNOSIS — I619 Nontraumatic intracerebral hemorrhage, unspecified: Secondary | ICD-10-CM | POA: Diagnosis not present

## 2012-11-19 DIAGNOSIS — I739 Peripheral vascular disease, unspecified: Secondary | ICD-10-CM

## 2012-11-19 DIAGNOSIS — I615 Nontraumatic intracerebral hemorrhage, intraventricular: Secondary | ICD-10-CM

## 2012-11-19 DIAGNOSIS — E039 Hypothyroidism, unspecified: Secondary | ICD-10-CM | POA: Diagnosis not present

## 2012-11-19 NOTE — Progress Notes (Signed)
[facility] [address] [place] [address] Phone: [phone] Fax: [phone]  Date:  11/19/2012   Name:  [name]   DOB:  [birth year]   MRN:  [id number] Gender: male Age: 72 y.o.  Primary Physician:  [name], MD  Evaluating MD: [name], MD   Chief Complaint: Follow-up   History of Present Illness:  [name] is a 72 y.o. pleasant patient who presents with the following:  Has not been able to work-out really   Maybe stents in the R LEG, limited by PAD Limited by R leg pain right now.   Thyroid: No symptoms. Labs reviewed. Denies cold / heat intolerance, dry skin, hair loss. No goiter.  Lab Results  Component Value Date   TSH 0.20* 08/13/2012   on 200 mcg Synthroid  Last OV: F/u s/p ICH and s/p large R DVT, d/c 4/14 at [facility] for DVT and placed a greenfield filter. He is having a lot of pain in the right leg. Requiring about 2 vicodin a day now.   Headaches are completely gone. On Depakote - f/u with neurology is in July.  Large R LE DVT, IVC filter at [facility] 2 weeks ago. Common femoral vein to popliteal, s/p IVC placement  ICH. Managed conservatively as below.  Trying to walk some now, as much as he is able. BP is stable and under control.  Hospital Discharge: 72 y.o. male history of hypertension, hyperlipidemia, coronary artery disease with CABG, left subclavian artery stent placement and COPD, presenting with a history of persistent headache over about one month with marked increase in headache severity over the past 3 days. He describes the pain as 8-9/10 in intensity and worse with lying down. There is no associated nausea and he said no visual changes. He's also had no speech changes nor focal weakness. CT scan of his head showed right intraventricular hemorrhage involving the right lateral ventricle and extending into the left lateral and third ventricles. There was no evidence of hydrocephalus. NIH stroke scale  is 0. Patient has been on aspirin and Plavix daily. Patient was not a TPA candidate secondary to unclear time of onset and hemorrhage. He was admitted for further evaluation and treatment.  Hospital Course IVH was felt to be secondary to malignant hypertension. On aspirin 81 mg orally every day and clopidogrel 75 mg orally every day prior to admission. Plavix added to aspirin since left subclavian and left axillary stents placed 06/20/2012. Given large size of stents, after discussion with interventionalist, felt ok to discontinue plavix given IVH. Patient was continued on aspirin 81mg  daily.  For headache, was started on depakote 07/16/2012 with good results. Can have prn ultram. Confusion per wif experience in hospital likely side effect of narcotics (morphine and dilaudid) given for pain control  Malignant Hypertension, BP in ED 212/102. norvasc increased to 10 mg on 3/24. BPs trending down.   Patient with continued symptoms of severe headache. Physical therapy, occupational therapy and speech therapy evaluated patient. No follow up therapy recommended.   Patient Active Problem List   Diagnosis Date Noted  . Exertional shortness of breath 09/13/2012  . Right leg DVT 08/13/2012  . History of chronic CHF 07/15/2012  . HTN (hypertension) 07/15/2012  . Intraventricular hemorrhage 07/12/2012  . Peripheral arterial disease   . Ischemic cardiomyopathy 07/08/2011  . CAD (coronary artery disease), native coronary artery 07/07/2011  . Carotid bruit 04/25/2011  . Subclavian arterial stenosis 04/25/2011  . BPH (  benign prostatic hyperplasia) 12/29/2010  . COPD (chronic obstructive pulmonary disease) 01/08/2010  . ABDOMINAL AORTIC ANEURYSM 11/06/2009  . HYPERCHOLESTEROLEMIA 10/09/2009  . TOBACCO ABUSE 04/11/2008  . UNSPECIFIED PERIPHERAL VASCULAR DISEASE 04/11/2008  . PROSTATE SPECIFIC ANTIGEN, ELEVATED 04/11/2008  . HYPOTHYROIDISM NOS 09/07/2006    Past Medical History  Diagnosis Date  .  Thyroid disease     hypo  . Venous insufficiency   . Peripheral arterial disease     Previous left lower extremity stenting by Dr. Evette Cristal  . Tobacco abuse   . COPD (chronic obstructive pulmonary disease)   . Arthritis   . Hypothyroidism   . Subclavian artery stenosis, left 05/2012    Status post stenting of the ostium and self-expanding stent placement to the left axillary artery  . Right leg DVT 08/13/2012  . Intraventricular hemorrhage 07/12/2012    Past Surgical History  Procedure Laterality Date  . Cholecystectomy    . Angioplasty / stenting femoral    . Coronary artery bypass graft  07/11/2011    Procedure: CORONARY ARTERY BYPASS GRAFTING (CABG);  Surgeon: [name], MD;  Location: [facility] OR;  Service: Open Heart Surgery;  Laterality: N/A;  Times 3. On Pump. Using right greater saphenous vein and left internal mammary artery.   . Cardiac catheterization  2/14    [facility]  . Subclavian artery stent  2014    x2  . Vena cava filter placement  07/2012    Removable    History   Social History  . Marital Status: Married    Spouse Name: N/A    Number of Children: 2  . Years of Education: N/A   Occupational History  . Retired     Comptroller   Social History Main Topics  . Smoking status: Former Smoker -- 1.00 packs/day for 50 years    Types: Cigarettes    Quit date: 04/27/2011  . Smokeless tobacco: Never Used  . Alcohol Use: No  . Drug Use: No  . Sexually Active: Not Currently   Other Topics Concern  . Not on file   Social History Narrative   Exercsiing 3 times a week.    Moderate diet control.    O living will, no HCPOA.    Family History  Problem Relation Age of Onset  . Alcohol abuse Father   . Cirrhosis Father   . Hypothyroidism Sister     Allergies  Allergen Reactions  . Plavix (Clopidogrel Bisulfate)     Brain hemorrhage prev while on plavix    Medication list has been reviewed and updated.  Outpatient Prescriptions Prior to Visit    Medication Sig Dispense Refill  . amLODipine (NORVASC) 10 MG tablet Take 1 tablet (10 mg total) by mouth daily.  30 tablet  3  . aspirin 81 MG tablet Take 81 mg by mouth daily.      Marland Kitchen atorvastatin (LIPITOR) 20 MG tablet Take 1 tablet (20 mg total) by mouth daily at 6 PM.  90 tablet  3  . carvedilol (COREG) 3.125 MG tablet Take 1 tablet (3.125 mg total) by mouth 2 (two) times daily with a meal.  60 tablet  3  . levothyroxine (SYNTHROID, LEVOTHROID) 200 MCG tablet Take 200 mcg by mouth daily.      Marland Kitchen lisinopril (PRINIVIL,ZESTRIL) 5 MG tablet Take 1 tablet (5 mg total) by mouth daily.  30 tablet  3   No facility-administered medications prior to visit.    Review of Systems:  Significant claudication, denies  SOB, HA, Chest pain  Physical Examination: BP 138/82  Pulse 76  Temp(Src) 97.7 F (36.5 C) (Oral)  Ht 5\' 9"  (1.753 m)  Wt 167 lb 8 oz (75.978 kg)  BMI 24.72 kg/m2  SpO2 97%  Ideal Body Weight: Weight in (lb) to have BMI = 25: 168.9   GEN: WDWN, NAD, Non-toxic, A & O x 3 HEENT: Atraumatic, Normocephalic. Neck supple. No masses, No LAD. Ears and Nose: No external deformity. CV: RRR, No M/G/R. No JVD. No thrill. No extra heart sounds. PULM: CTA B, no wheezes, crackles, rhonchi. No retractions. No resp. distress. No accessory muscle use. EXTR: No c/c/e NEURO Normal gait.  PSYCH: Normally interactive. Conversant. Not depressed or anxious appearing.  Calm demeanor.    Assessment and Plan: UNSPECIFIED PERIPHERAL VASCULAR DISEASE  Intraventricular hemorrhage  HYPOTHYROIDISM NOS   Overall he is doing better with exception of severe R PAD. Planning stent after he gets back from [place] with Dr. Kirke Corin.   O/w stable  Signed, Oneta Sigman T. Holli Rengel, MD 11/19/2012 8:11 AM

## 2012-11-27 ENCOUNTER — Other Ambulatory Visit: Payer: Self-pay | Admitting: *Deleted

## 2012-11-27 MED ORDER — LISINOPRIL 5 MG PO TABS: 5.0000 mg | ORAL_TABLET | Freq: Every day | ORAL | Status: DC

## 2012-11-27 NOTE — Telephone Encounter (Signed)
Refilled Lisinopril sent to [facility] pharmacy. 90 day supply.

## 2012-12-11 ENCOUNTER — Other Ambulatory Visit: Payer: Self-pay

## 2012-12-11 MED ORDER — CARVEDILOL 3.125 MG PO TABS: 3.1250 mg | ORAL_TABLET | Freq: Two times a day (BID) | ORAL | Status: DC

## 2012-12-11 NOTE — Telephone Encounter (Signed)
Refill sent for carvedilol 3.125 mg take one tablet twice a day.  

## 2012-12-14 ENCOUNTER — Ambulatory Visit (INDEPENDENT_AMBULATORY_CARE_PROVIDER_SITE_OTHER): Payer: Medicare Other | Admitting: Cardiovascular Disease

## 2012-12-14 ENCOUNTER — Encounter: Payer: Self-pay | Admitting: Cardiovascular Disease

## 2012-12-14 VITALS — BP 121/75 | HR 74 | Ht 69.0 in | Wt 165.5 lb

## 2012-12-14 DIAGNOSIS — I10 Essential (primary) hypertension: Secondary | ICD-10-CM

## 2012-12-14 DIAGNOSIS — I714 Abdominal aortic aneurysm, without rupture, unspecified: Secondary | ICD-10-CM

## 2012-12-14 DIAGNOSIS — I82401 Acute embolism and thrombosis of unspecified deep veins of right lower extremity: Secondary | ICD-10-CM

## 2012-12-14 DIAGNOSIS — I2510 Atherosclerotic heart disease of native coronary artery without angina pectoris: Secondary | ICD-10-CM

## 2012-12-14 DIAGNOSIS — I739 Peripheral vascular disease, unspecified: Secondary | ICD-10-CM

## 2012-12-14 DIAGNOSIS — I82409 Acute embolism and thrombosis of unspecified deep veins of unspecified lower extremity: Secondary | ICD-10-CM

## 2012-12-14 DIAGNOSIS — I2589 Other forms of chronic ischemic heart disease: Secondary | ICD-10-CM | POA: Diagnosis not present

## 2012-12-14 DIAGNOSIS — I255 Ischemic cardiomyopathy: Secondary | ICD-10-CM

## 2012-12-14 DIAGNOSIS — I771 Stricture of artery: Secondary | ICD-10-CM

## 2012-12-14 MED ORDER — CLOPIDOGREL BISULFATE 75 MG PO TABS: 75.0000 mg | ORAL_TABLET | Freq: Every day | ORAL | Status: DC

## 2012-12-14 NOTE — Patient Instructions (Addendum)
Make the following changes 1 week before the procedure: Stop Aspirin Start Plavix 75 mg once daily.   Your physician has requested that you have a peripheral vascular angiogram. This exam is performed at the hospital. During this exam IV contrast is used to look at arterial blood flow. Please review the information sheet given for details.  Procedure is scheduled for Wednesday 12/26/12 at [facility].  Please arrive at short stay at 8:30am.

## 2012-12-14 NOTE — Progress Notes (Signed)
 Primary care physician: Dr. Copland Primary cardiologist: Dr. Gollan  HPI  [name] is a pleasant 72-year-old gentleman who is here today for a followup visit regarding peripheral arterial disease with previous stenting of the left subclavian artery and left axillary artery and significant lower extremity claudication.  The patient has multiple medical problems including hyperlipidemia, long smoking history for 60 years, COPD , stent to his left lower extremity for claudication done years ago by Dr. Sankar,   carotid showed 40% to 59% bilateral disease, and atherosclerotic coronary artery disease status post CABG in March of 2013 with LIMA to the LAD, vein graft to the PDA, vein graft to the OM. He does have mild aortic valve stenosis.  He was seen in February, 2014 for worsening left subclavian artery stenosis with severe left arm claudication with a 54 mm pressure difference in both arms. I performed aortic arch angiography which showed severe ostial left subclavian artery stenosis and severe left proximal axillary artery stenosis. He underwent successful self-expanding stent placement to the axillary artery balloon expandable stent to the ostial subclavian artery. He was admitted to [place] in March for her headache and was found to have a small intracranial hemorrhage which was suspected due to malignant hypertension and dual antiplatelet therapy. He was treated conservatively and discharged home off antiplatelet medications. He called our office shortly after discharge with right leg discomfort and swelling. Venous Doppler ultrasound showed extensive DVT. This was a spontanous DVT as there was no instrumentation or manual pressure on that side. Anticoagulation was contraindicated and thus an IVC filter was placed. The patient had significant discomfort in his leg after that which gradually improved and now he is feeling better. He is using compression stockings.  He has been having  significant thigh and calf claudication worse on the right side which has been happening with minimal activities. Noninvasive evaluation showed severe bilateral common iliac disease worse on the right side with SFA disease on the right side as well.     Allergies  Allergen Reactions  . Plavix [Clopidogrel Bisulfate]     Brain hemorrhage prev while on plavix     Current Outpatient Prescriptions on File Prior to Visit  Medication Sig Dispense Refill  . amLODipine (NORVASC) 10 MG tablet Take 1 tablet (10 mg total) by mouth daily.  30 tablet  3  . aspirin 81 MG tablet Take 81 mg by mouth daily.      . atorvastatin (LIPITOR) 20 MG tablet Take 1 tablet (20 mg total) by mouth daily at 6 PM.  90 tablet  3  . carvedilol (COREG) 3.125 MG tablet Take 1 tablet (3.125 mg total) by mouth 2 (two) times daily with a meal.  180 tablet  3  . levothyroxine (SYNTHROID, LEVOTHROID) 200 MCG tablet Take 200 mcg by mouth daily.      . lisinopril (PRINIVIL,ZESTRIL) 5 MG tablet Take 1 tablet (5 mg total) by mouth daily.  90 tablet  3  . [DISCONTINUED] Fluticasone-Salmeterol (ADVAIR DISKUS) 250-50 MCG/DOSE AEPB Inhale 1 puff into the lungs 2 (two) times daily.  1 each  11  . [DISCONTINUED] pravastatin (PRAVACHOL) 40 MG tablet Take 1 tablet (40 mg total) by mouth daily.  30 tablet  6   No current facility-administered medications on file prior to visit.     Past Medical History  Diagnosis Date  . Thyroid disease     hypo  . Venous insufficiency   . Peripheral arterial disease       Previous left lower extremity stenting by Dr. Sankar  . Tobacco abuse   . COPD (chronic obstructive pulmonary disease)   . Arthritis   . Hypothyroidism   . Subclavian artery stenosis, left 05/2012    Status post stenting of the ostium and self-expanding stent placement to the left axillary artery  . Right leg DVT 08/13/2012  . Intraventricular hemorrhage 07/12/2012     Past Surgical History  Procedure Laterality Date  .  Cholecystectomy    . Angioplasty / stenting femoral    . Coronary artery bypass graft  07/11/2011    Procedure: CORONARY ARTERY BYPASS GRAFTING (CABG);  Surgeon: [name] B Gerhardt, MD;  Location: [facility] OR;  Service: Open Heart Surgery;  Laterality: N/A;  Times 3. On Pump. Using right greater saphenous vein and left internal mammary artery.   . Cardiac catheterization  2/14    [facility]  . Subclavian artery stent  2014    x2  . Vena cava filter placement  07/2012    Removable     Family History  Problem Relation Age of Onset  . Alcohol abuse Father   . Cirrhosis Father   . Hypothyroidism Sister      History   Social History  . Marital Status: Married    Spouse Name: N/A    Number of Children: 2  . Years of Education: N/A   Occupational History  . Retired     mechanical engineer   Social History Main Topics  . Smoking status: Former Smoker -- 1.00 packs/day for 50 years    Types: Cigarettes    Quit date: 04/27/2011  . Smokeless tobacco: Never Used  . Alcohol Use: No  . Drug Use: No  . Sexual Activity: Not Currently   Other Topics Concern  . Not on file   Social History Narrative   Exercsiing 3 times a week.    Moderate diet control.    O living will, no HCPOA.      PHYSICAL EXAM   There were no vitals taken for this visit. Constitutional: He is oriented to person, place, and time. He appears well-developed and well-nourished. No distress.  HENT: No nasal discharge.  Head: Normocephalic and atraumatic.  Eyes: Pupils are equal and round. Right eye exhibits no discharge. Left eye exhibits no discharge.  Neck: Normal range of motion. Neck supple. No JVD present. No thyromegaly present. There is left carotid bruit. There is a bruit in the left subclavian artery area. Cardiovascular: Normal rate, regular rhythm, normal heart sounds and. Exam reveals no gallop and no friction rub. There is 2/6 systolic ejection murmur at the aortic area.  Pulmonary/Chest: Effort normal and  breath sounds normal. No stridor. No respiratory distress. He has no wheezes. He has no rales. He exhibits no tenderness.  Abdominal: Soft. Bowel sounds are normal. He exhibits no distension. There is no tenderness. There is no rebound and no guarding.  Musculoskeletal: Normal range of motion. He exhibits no edema and no tenderness.  Neurological: He is alert and oriented to person, place, and time. Coordination normal.  Skin: Skin is warm and dry. No rash noted. He is not diaphoretic. No erythema. No pallor.  Psychiatric: He has a normal mood and affect. His behavior is normal. Judgment and thought content normal.  Vascular: Right radial pulse is normal. Left radial pulse is normal. Femoral pulse is: +1 on the right side and +2 on the left side. Distal pulses are not palpable.      ASSESSMENT AND   PLAN   

## 2012-12-15 LAB — CBC WITH DIFFERENTIAL/PLATELET
Basophils Absolute: 0 10*3/uL (ref 0.0–0.2)
Eos: 4 % (ref 0–5)
HCT: 46.3 % (ref 37.5–51.0)
Hemoglobin: 15 g/dL (ref 12.6–17.7)
Immature Grans (Abs): 0 10*3/uL (ref 0.0–0.1)
Immature Granulocytes: 0 % (ref 0–2)
Lymphs: 33 % (ref 14–46)
MCHC: 32.4 g/dL (ref 31.5–35.7)
Monocytes: 10 % (ref 4–12)
Neutrophils Absolute: 3.6 10*3/uL (ref 1.4–7.0)
RDW: 13.4 % (ref 12.3–15.4)
WBC: 6.8 10*3/uL (ref 3.4–10.8)

## 2012-12-15 LAB — BASIC METABOLIC PANEL
BUN/Creatinine Ratio: 18 (ref 10–22)
Calcium: 9.5 mg/dL (ref 8.6–10.2)
GFR calc Af Amer: 52 mL/min/{1.73_m2} — ABNORMAL LOW (ref >59)
GFR calc non Af Amer: 45 mL/min/{1.73_m2} — ABNORMAL LOW (ref >59)
Potassium: 5.6 mmol/L — ABNORMAL HIGH (ref 3.5–5.2)
Sodium: 140 mmol/L (ref 134–144)

## 2012-12-16 ENCOUNTER — Encounter: Payer: Self-pay | Admitting: Cardiovascular Disease

## 2012-12-16 NOTE — Assessment & Plan Note (Signed)
Symptoms that are related to this have resolved completely. He continues to use support stockings. I explained to him that there is a risk of worsening edema on the right side if we intervene on the right arterial system.

## 2012-12-16 NOTE — Assessment & Plan Note (Signed)
He did have previous endovascular intervention on the left side many years ago by Dr.Sanker.  He now has severe lifestyle limiting claudication involving mostly the right leg. ABI was severely reduced on the right side at 0.37 and moderately reduced on the left side at 0.55. He has evidence of iliac disease worse on the right side as well as ostial and proximal SFA disease on the right side. He is clearly disabled from this and has not been able to resume his exercise. He did have previous small intracranial hemorrhage while he was on dual antiplatelet therapy.  I discussed the option of treating this conservatively. However, symptoms are currently severe and significantly affecting his lifestyle. Due to that, I recommend proceeding with abdominal aortogram, lower extremity runoff and possible angioplasty. I will plan on treating him with Plavix and on without aspirin.  He does have mild chronic kidney disease and I will plan to hydrate him before the procedure and likely keep him overnight for hydration after the procedure. Plan access via the left femoral artery.

## 2012-12-16 NOTE — Assessment & Plan Note (Signed)
His left radial pulse is normal. He has residual discomfort in his hand which seems to be related to arthritis. He will need a followup carotid Doppler in 6 months.

## 2012-12-19 ENCOUNTER — Telehealth: Payer: Self-pay

## 2012-12-19 ENCOUNTER — Encounter (HOSPITAL_COMMUNITY): Payer: Self-pay | Admitting: Pharmacy Technician

## 2012-12-19 NOTE — Telephone Encounter (Signed)
Spoke w/ [name] and informed her that, per Dr. Jari Sportsman request, pt is to arrive at short stay at 6:00am for hydration. She is agreement with this plan.

## 2012-12-19 NOTE — Telephone Encounter (Signed)
Message copied by Marilynne Halsted on Wed Dec 19, 2012  3:14 PM ------      Message from: [place], [place] A      Created: Sun Dec 16, 2012 10:30 AM       Kidney function is mildly abnormal but stable from before. I want him to arrive earliest possible time to short stay on the day of angiogram for hydration. Please arrange this with short stay and the patient. ------

## 2012-12-20 ENCOUNTER — Telehealth: Payer: Self-pay

## 2012-12-20 NOTE — Telephone Encounter (Signed)
Message copied by Marilynne Halsted on Thu Dec 20, 2012  1:35 PM ------      Message from: [place], [place] A      Created: Sun Dec 16, 2012 10:30 AM       Kidney function is mildly abnormal but stable from before. I want him to arrive earliest possible time to short stay on the day of angiogram for hydration. Please arrange this with short stay and the patient. ------

## 2012-12-20 NOTE — Telephone Encounter (Signed)
Pt to arrive at short stay at 6:30am on day of procedure for hydration.  Pt aware.

## 2012-12-26 ENCOUNTER — Encounter (HOSPITAL_COMMUNITY): Payer: Self-pay | Admitting: General Practice

## 2012-12-26 ENCOUNTER — Encounter (HOSPITAL_COMMUNITY)
Admission: RE | Disposition: A | Discharge status: Home / Self Care | Payer: Self-pay | Source: Ambulatory Visit | Attending: Cardiovascular Disease

## 2012-12-26 ENCOUNTER — Ambulatory Visit (HOSPITAL_COMMUNITY)
Admission: RE | Admit: 2012-12-26 | Discharge: 2012-12-27 | Disposition: A | Discharge status: Home / Self Care | Payer: Medicare Other | Source: Ambulatory Visit | Attending: Cardiovascular Disease | Admitting: Cardiovascular Disease

## 2012-12-26 DIAGNOSIS — Z7982 Long term (current) use of aspirin: Secondary | ICD-10-CM | POA: Insufficient documentation

## 2012-12-26 DIAGNOSIS — I2589 Other forms of chronic ischemic heart disease: Secondary | ICD-10-CM | POA: Insufficient documentation

## 2012-12-26 DIAGNOSIS — F17200 Nicotine dependence, unspecified, uncomplicated: Secondary | ICD-10-CM | POA: Diagnosis present

## 2012-12-26 DIAGNOSIS — I739 Peripheral vascular disease, unspecified: Secondary | ICD-10-CM

## 2012-12-26 DIAGNOSIS — Z79899 Other long term (current) drug therapy: Secondary | ICD-10-CM | POA: Diagnosis not present

## 2012-12-26 DIAGNOSIS — I359 Nonrheumatic aortic valve disorder, unspecified: Secondary | ICD-10-CM | POA: Insufficient documentation

## 2012-12-26 DIAGNOSIS — N182 Chronic kidney disease, stage 2 (mild): Secondary | ICD-10-CM | POA: Diagnosis not present

## 2012-12-26 DIAGNOSIS — I701 Atherosclerosis of renal artery: Secondary | ICD-10-CM | POA: Insufficient documentation

## 2012-12-26 DIAGNOSIS — I700 Atherosclerosis of aorta: Secondary | ICD-10-CM | POA: Insufficient documentation

## 2012-12-26 DIAGNOSIS — E039 Hypothyroidism, unspecified: Secondary | ICD-10-CM | POA: Insufficient documentation

## 2012-12-26 DIAGNOSIS — Z87891 Personal history of nicotine dependence: Secondary | ICD-10-CM | POA: Diagnosis not present

## 2012-12-26 DIAGNOSIS — Z9861 Coronary angioplasty status: Secondary | ICD-10-CM | POA: Insufficient documentation

## 2012-12-26 DIAGNOSIS — I872 Venous insufficiency (chronic) (peripheral): Secondary | ICD-10-CM | POA: Diagnosis not present

## 2012-12-26 DIAGNOSIS — Z86718 Personal history of other venous thrombosis and embolism: Secondary | ICD-10-CM | POA: Diagnosis not present

## 2012-12-26 DIAGNOSIS — I714 Abdominal aortic aneurysm, without rupture, unspecified: Secondary | ICD-10-CM | POA: Diagnosis not present

## 2012-12-26 DIAGNOSIS — I6529 Occlusion and stenosis of unspecified carotid artery: Secondary | ICD-10-CM | POA: Diagnosis not present

## 2012-12-26 DIAGNOSIS — J4489 Other specified chronic obstructive pulmonary disease: Secondary | ICD-10-CM | POA: Insufficient documentation

## 2012-12-26 DIAGNOSIS — J449 Chronic obstructive pulmonary disease, unspecified: Secondary | ICD-10-CM | POA: Diagnosis not present

## 2012-12-26 DIAGNOSIS — I129 Hypertensive chronic kidney disease with stage 1 through stage 4 chronic kidney disease, or unspecified chronic kidney disease: Secondary | ICD-10-CM | POA: Insufficient documentation

## 2012-12-26 DIAGNOSIS — Z8673 Personal history of transient ischemic attack (TIA), and cerebral infarction without residual deficits: Secondary | ICD-10-CM | POA: Diagnosis not present

## 2012-12-26 DIAGNOSIS — I771 Stricture of artery: Secondary | ICD-10-CM | POA: Diagnosis not present

## 2012-12-26 DIAGNOSIS — I10 Essential (primary) hypertension: Secondary | ICD-10-CM

## 2012-12-26 DIAGNOSIS — I770 Arteriovenous fistula, acquired: Secondary | ICD-10-CM | POA: Insufficient documentation

## 2012-12-26 DIAGNOSIS — I2510 Atherosclerotic heart disease of native coronary artery without angina pectoris: Secondary | ICD-10-CM | POA: Insufficient documentation

## 2012-12-26 DIAGNOSIS — M129 Arthropathy, unspecified: Secondary | ICD-10-CM | POA: Insufficient documentation

## 2012-12-26 DIAGNOSIS — E785 Hyperlipidemia, unspecified: Secondary | ICD-10-CM | POA: Diagnosis not present

## 2012-12-26 DIAGNOSIS — I70219 Atherosclerosis of native arteries of extremities with intermittent claudication, unspecified extremity: Secondary | ICD-10-CM | POA: Insufficient documentation

## 2012-12-26 HISTORY — PX: ANGIOPLASTY / STENTING ILIAC: SUR31

## 2012-12-26 HISTORY — DX: Essential (primary) hypertension: I10

## 2012-12-26 HISTORY — DX: Heart failure, unspecified: I50.9

## 2012-12-26 HISTORY — PX: ABDOMINAL AORTAGRAM: SHX5454

## 2012-12-26 HISTORY — DX: Atherosclerosis of renal artery: I70.1

## 2012-12-26 HISTORY — PX: INSERTION OF ILIAC STENT: SHX6256

## 2012-12-26 LAB — POCT ACTIVATED CLOTTING TIME
Activated Clotting Time: 247 seconds
Activated Clotting Time: 160 seconds

## 2012-12-26 LAB — BASIC METABOLIC PANEL
BUN: 21 mg/dL (ref 6–23)
CO2: 22 mEq/L (ref 19–32)
Calcium: 9.1 mg/dL (ref 8.4–10.5)
GFR calc non Af Amer: 53 mL/min — ABNORMAL LOW (ref >90)
Glucose, Bld: 89 mg/dL (ref 70–99)

## 2012-12-26 SURGERY — ABDOMINAL AORTAGRAM
Anesthesia: LOCAL

## 2012-12-26 MED ORDER — ONDANSETRON HCL 4 MG/2ML IJ SOLN
INTRAMUSCULAR | Status: AC
  Filled 2012-12-26: qty 2

## 2012-12-26 MED ORDER — SODIUM CHLORIDE 0.9 % IV SOLN: 250.0000 mL | INTRAVENOUS | Status: DC | PRN

## 2012-12-26 MED ORDER — CLOPIDOGREL BISULFATE 75 MG PO TABS
75.0000 mg | ORAL_TABLET | Freq: Every day | ORAL | Status: DC
  Administered 2012-12-27: 75 mg via ORAL
  Filled 2012-12-26: qty 1

## 2012-12-26 MED ORDER — LEVOTHYROXINE SODIUM 200 MCG PO TABS
200.0000 ug | ORAL_TABLET | Freq: Every day | ORAL | Status: DC
Start: 2012-12-27 — End: 2012-12-27
  Administered 2012-12-27: 200 ug via ORAL
  Filled 2012-12-26 (×2): qty 1

## 2012-12-26 MED ORDER — HEPARIN (PORCINE) IN NACL 2-0.9 UNIT/ML-% IJ SOLN
INTRAMUSCULAR | Status: AC
  Filled 2012-12-26: qty 1000

## 2012-12-26 MED ORDER — SODIUM CHLORIDE 0.9 % IJ SOLN: 3.0000 mL | INTRAMUSCULAR | Status: DC | PRN

## 2012-12-26 MED ORDER — LISINOPRIL 5 MG PO TABS
5.0000 mg | ORAL_TABLET | Freq: Every day | ORAL | Status: DC
  Administered 2012-12-27: 5 mg via ORAL
  Filled 2012-12-26: qty 1

## 2012-12-26 MED ORDER — SODIUM CHLORIDE 0.9 % IV SOLN
INTRAVENOUS | Status: DC
  Administered 2012-12-26: 1000 mL via INTRAVENOUS

## 2012-12-26 MED ORDER — ATORVASTATIN CALCIUM 20 MG PO TABS
20.0000 mg | ORAL_TABLET | Freq: Every day | ORAL | Status: DC
  Administered 2012-12-26: 20 mg via ORAL
  Filled 2012-12-26 (×3): qty 1

## 2012-12-26 MED ORDER — HEPARIN SODIUM (PORCINE) 1000 UNIT/ML IJ SOLN
INTRAMUSCULAR | Status: AC
  Filled 2012-12-26: qty 1

## 2012-12-26 MED ORDER — FENTANYL CITRATE 0.05 MG/ML IJ SOLN
INTRAMUSCULAR | Status: AC
  Filled 2012-12-26: qty 2

## 2012-12-26 MED ORDER — LABETALOL HCL 5 MG/ML IV SOLN
10.0000 mg | INTRAVENOUS | Status: DC | PRN
  Filled 2012-12-26 (×2): qty 4

## 2012-12-26 MED ORDER — CARVEDILOL 3.125 MG PO TABS
3.1250 mg | ORAL_TABLET | Freq: Two times a day (BID) | ORAL | Status: DC
  Administered 2012-12-26 – 2012-12-27 (×2): 3.125 mg via ORAL
  Filled 2012-12-26 (×4): qty 1

## 2012-12-26 MED ORDER — LIDOCAINE HCL (PF) 1 % IJ SOLN
INTRAMUSCULAR | Status: AC
  Filled 2012-12-26: qty 30

## 2012-12-26 MED ORDER — ONDANSETRON HCL 4 MG/2ML IJ SOLN: 4.0000 mg | Freq: Four times a day (QID) | INTRAMUSCULAR | Status: DC | PRN

## 2012-12-26 MED ORDER — SODIUM CHLORIDE 0.9 % IV SOLN: INTRAVENOUS | Status: AC

## 2012-12-26 MED ORDER — SODIUM CHLORIDE 0.9 % IJ SOLN: 3.0000 mL | Freq: Two times a day (BID) | INTRAMUSCULAR | Status: DC

## 2012-12-26 MED ORDER — CLOPIDOGREL BISULFATE 75 MG PO TABS: 75.0000 mg | ORAL_TABLET | ORAL | Status: DC

## 2012-12-26 MED ORDER — ACETAMINOPHEN 325 MG PO TABS: 650.0000 mg | ORAL_TABLET | ORAL | Status: DC | PRN

## 2012-12-26 MED ORDER — MORPHINE SULFATE 2 MG/ML IJ SOLN: 2.0000 mg | INTRAMUSCULAR | Status: DC | PRN

## 2012-12-26 MED ORDER — MIDAZOLAM HCL 2 MG/2ML IJ SOLN
INTRAMUSCULAR | Status: AC
  Filled 2012-12-26: qty 2

## 2012-12-26 MED ORDER — AMLODIPINE BESYLATE 10 MG PO TABS
10.0000 mg | ORAL_TABLET | Freq: Every day | ORAL | Status: DC
  Administered 2012-12-27: 09:00:00 10 mg via ORAL
  Filled 2012-12-26: qty 1

## 2012-12-26 NOTE — Interval H&P Note (Signed)
History and Physical Interval Note:  12/26/2012 10:24 AM  [name]  has presented today for surgery, with the diagnosis of pvd  The various methods of treatment have been discussed with the patient and family. After consideration of risks, benefits and other options for treatment, the patient has consented to  Procedure(s): ABDOMINAL AORTAGRAM (N/A) as a surgical intervention .  The patient's history has been reviewed, patient examined, no change in status, stable for surgery.  I have reviewed the patient's chart and labs.  Questions were answered to the patient's satisfaction.     Lorine Bears

## 2012-12-26 NOTE — H&P (View-Only) (Signed)
Primary care physician: Dr. Patsy Lager Primary cardiologist: Dr. Mariah Milling  HPI  Mr. Gibbon is a pleasant 72 year old gentleman who is here today for a followup visit regarding peripheral arterial disease with previous stenting of the left subclavian artery and left axillary artery and significant lower extremity claudication.  The patient has multiple medical problems including hyperlipidemia, long smoking history for 60 years, COPD , stent to his left lower extremity for claudication done years ago by Dr. Evette Cristal,   carotid showed 40% to 59% bilateral disease, and atherosclerotic coronary artery disease status post CABG in March of 2013 with LIMA to the LAD, vein graft to the PDA, vein graft to the OM. He does have mild aortic valve stenosis.  He was seen in February, 2014 for worsening left subclavian artery stenosis with severe left arm claudication with a 54 mm pressure difference in both arms. I performed aortic arch angiography which showed severe ostial left subclavian artery stenosis and severe left proximal axillary artery stenosis. He underwent successful self-expanding stent placement to the axillary artery balloon expandable stent to the ostial subclavian artery. He was admitted to [facility] in March for her headache and was found to have a small intracranial hemorrhage which was suspected due to malignant hypertension and dual antiplatelet therapy. He was treated conservatively and discharged home off antiplatelet medications. He called our office shortly after discharge with right leg discomfort and swelling. Venous Doppler ultrasound showed extensive DVT. This was a spontanous DVT as there was no instrumentation or manual pressure on that side. Anticoagulation was contraindicated and thus an IVC filter was placed. The patient had significant discomfort in his leg after that which gradually improved and now he is feeling better. He is using compression stockings.  He has been having  significant thigh and calf claudication worse on the right side which has been happening with minimal activities. Noninvasive evaluation showed severe bilateral common iliac disease worse on the right side with SFA disease on the right side as well.     Allergies  Allergen Reactions  . Plavix [Clopidogrel Bisulfate]     Brain hemorrhage prev while on plavix     Current Outpatient Prescriptions on File Prior to Visit  Medication Sig Dispense Refill  . amLODipine (NORVASC) 10 MG tablet Take 1 tablet (10 mg total) by mouth daily.  30 tablet  3  . aspirin 81 MG tablet Take 81 mg by mouth daily.      Marland Kitchen atorvastatin (LIPITOR) 20 MG tablet Take 1 tablet (20 mg total) by mouth daily at 6 PM.  90 tablet  3  . carvedilol (COREG) 3.125 MG tablet Take 1 tablet (3.125 mg total) by mouth 2 (two) times daily with a meal.  180 tablet  3  . levothyroxine (SYNTHROID, LEVOTHROID) 200 MCG tablet Take 200 mcg by mouth daily.      Marland Kitchen lisinopril (PRINIVIL,ZESTRIL) 5 MG tablet Take 1 tablet (5 mg total) by mouth daily.  90 tablet  3  . [DISCONTINUED] Fluticasone-Salmeterol (ADVAIR DISKUS) 250-50 MCG/DOSE AEPB Inhale 1 puff into the lungs 2 (two) times daily.  1 each  11  . [DISCONTINUED] pravastatin (PRAVACHOL) 40 MG tablet Take 1 tablet (40 mg total) by mouth daily.  30 tablet  6   No current facility-administered medications on file prior to visit.     Past Medical History  Diagnosis Date  . Thyroid disease     hypo  . Venous insufficiency   . Peripheral arterial disease  Previous left lower extremity stenting by Dr. Evette Cristal  . Tobacco abuse   . COPD (chronic obstructive pulmonary disease)   . Arthritis   . Hypothyroidism   . Subclavian artery stenosis, left 05/2012    Status post stenting of the ostium and self-expanding stent placement to the left axillary artery  . Right leg DVT 08/13/2012  . Intraventricular hemorrhage 07/12/2012     Past Surgical History  Procedure Laterality Date  .  Cholecystectomy    . Angioplasty / stenting femoral    . Coronary artery bypass graft  07/11/2011    Procedure: CORONARY ARTERY BYPASS GRAFTING (CABG);  Surgeon: [name], MD;  Location: [facility] OR;  Service: Open Heart Surgery;  Laterality: N/A;  Times 3. On Pump. Using right greater saphenous vein and left internal mammary artery.   . Cardiac catheterization  2/14    [facility]  . Subclavian artery stent  2014    x2  . Vena cava filter placement  07/2012    Removable     Family History  Problem Relation Age of Onset  . Alcohol abuse Father   . Cirrhosis Father   . Hypothyroidism Sister      History   Social History  . Marital Status: Married    Spouse Name: N/A    Number of Children: 2  . Years of Education: N/A   Occupational History  . Retired     Comptroller   Social History Main Topics  . Smoking status: Former Smoker -- 1.00 packs/day for 50 years    Types: Cigarettes    Quit date: 04/27/2011  . Smokeless tobacco: Never Used  . Alcohol Use: No  . Drug Use: No  . Sexual Activity: Not Currently   Other Topics Concern  . Not on file   Social History Narrative   Exercsiing 3 times a week.    Moderate diet control.    O living will, no HCPOA.      PHYSICAL EXAM   There were no vitals taken for this visit. Constitutional: He is oriented to person, place, and time. He appears well-developed and well-nourished. No distress.  HENT: No nasal discharge.  Head: Normocephalic and atraumatic.  Eyes: Pupils are equal and round. Right eye exhibits no discharge. Left eye exhibits no discharge.  Neck: Normal range of motion. Neck supple. No JVD present. No thyromegaly present. There is left carotid bruit. There is a bruit in the left subclavian artery area. Cardiovascular: Normal rate, regular rhythm, normal heart sounds and. Exam reveals no gallop and no friction rub. There is 2/6 systolic ejection murmur at the aortic area.  Pulmonary/Chest: Effort normal and  breath sounds normal. No stridor. No respiratory distress. He has no wheezes. He has no rales. He exhibits no tenderness.  Abdominal: Soft. Bowel sounds are normal. He exhibits no distension. There is no tenderness. There is no rebound and no guarding.  Musculoskeletal: Normal range of motion. He exhibits no edema and no tenderness.  Neurological: He is alert and oriented to person, place, and time. Coordination normal.  Skin: Skin is warm and dry. No rash noted. He is not diaphoretic. No erythema. No pallor.  Psychiatric: He has a normal mood and affect. His behavior is normal. Judgment and thought content normal.  Vascular: Right radial pulse is normal. Left radial pulse is normal. Femoral pulse is: +1 on the right side and +2 on the left side. Distal pulses are not palpable.      ASSESSMENT AND  PLAN

## 2012-12-26 NOTE — Progress Notes (Signed)
Spoke with [name], RN re plavix order and elevated potassium.  Patient took plavix at home at 0430 this am.  Will not need to give again.  Order received to repeat potassium at 0900.

## 2012-12-26 NOTE — Progress Notes (Signed)
Right and left arterial sheaths removed at bedside.Manual compression x 20 minutes both sides' distal pulses remained palpable during and after sheath pull. Both sites level 0. No complications. Patient give post instructions.RN at bedside

## 2012-12-26 NOTE — CV Procedure (Signed)
PERIPHERAL VASCULAR PROCEDURE  NAME:  [name]   MRN: [id number] DOB:  1940-06-22   ADMIT DATE: 12/26/2012  Performing Cardiologist: Lorine Bears Primary Physician: [name], MD Primary Cardiologist:  Dr. Mariah Milling  Procedures Performed:  Bilateral femoral artery access  Abdominal Aortic Angiogram with Bi-Iliofemoral Runoff  Bilateral Lower Extremity Angiography (1st Order)  Selective right lower extremity angiography  Kissing stent placement to both common iliac arteries extending into the distal aorta.    Indication(s):   Severe Claudication   Consent: The procedure with Risks/Benefits/Alternatives and Indications was reviewed with the patient .  All questions were answered.  Medications:  Sedation:  3 mg IV Versed, 100 mcg IV Fentanyl  Contrast:  180 ml  Visipaque   Procedural details: The left groin was prepped, draped, and anesthetized with 1% lidocaine. Using modified Seldinger technique, a 5 French sheath was introduced into the left common common femoral artery. A 5 Fr Short Pigtail Catheter was advanced of over a  Versicore wire into the descending Aorta to a level just above the renal arteries. A power injection of 51ml/sec contrast over 1 sec was performed for Abdominal Aortic Angiography.  The catheter was then pulled back to a level just above the Aortic bifurcation, and a second power injection was performed to evaluate the iliac arteries.   Bilateral lower extremity arterial run off was then performed via power injection of 7 ml / sec contrast for a total of 77 ml.   Interventional Procedure:   angiography showed confirmed significant ostial common iliac artery stenosis bilaterally extending into the distal aorta. There was incidental finding of AV fistula originating from the popliteal artery. I performed selective right lower extremity angiography after placing the sheath in the right common femoral artery.  I felt that treating this would be  best done surgically given that this would require a covered stent and the need for dual antiplatelet therapy which he might not be able to tolerate due to his known history of intracranial bleed. I decided to proceed with treating his iliac disease today. I used a micropuncture needle and get access the right common femoral artery. The sheath was exchanged into a 7 French 35 cm bright tip sheath. A 5 French sheath in the left femoral artery was exchanged into a 7 French 35 cm bright tip sheath. A total of 4000 units of unfractionated heparin was given with an ACT of 247. Both sheaths were advanced to the distal aorta. An 8 x 29 mm Genesis balloon expandable stent was advanced bilaterally with careful positioning to cover the lesion in the distal aorta. Both stents were overlapped. The stents were deployed together to 8 atmospheres. The 2 balloons were advanced to the distal aorta and again inflated to 10 atmosphere. Final angiography showed excellent results. Both sheaths were left in place to be removed manually.   Hemodynamics:  Central Aortic Pressure / Mean Aortic Pressure:  141/56  Findings:  Abdominal aorta:  there is diffuse atherosclerosis and abdominal aorta with small infrarenal aneurysm. There is 60% stenosis in the distal aorta just above the bifurcation   Left renal artery: 90-95% proximal stenosis.   Right renal artery: 80-90% ostial stenosis.   Celiac artery:  Not well visualized  Superior mesenteric artery:  not well visualized.   Right common iliac artery:  80% ostial stenosis.   Right internal iliac artery:  80% ostial stenosis.   Right external iliac artery:  minor irregularities.   Right common femoral  artery:  minor irregularities.  Right profunda femoral artery:  mild diffuse disease throughout its course with 40% mid stenosis.  Right superficial femoral artery:  mild diffuse disease proximally followed by 40% diffuse disease in the midsegment. There is 70-80%  discrete disease distally with diffuse 30-40% disease in the rest of the segment.  Right popliteal artery:  normal in size with diffuse atherosclerosis. An AV fistula is noted originating from the proximal popliteal artery.   Right tibial peroneal trunk:  minor irregularities.  Right anterior tibial artery:  normal in size with diffuse 50% disease proximally the vessel might be occluded a short segment and the mid area but not well visualized due to poor flow below the knee likely due to the AV fistula.   Right peroneal artery:  appears to be patent but underfilling   Right posterior tibial artery:  patent but underfilling.   Left common iliac artery:   there is 70% ostial stenosis.   Left internal iliac artery:  70% ostial stenosis.   Left external iliac artery:  minor irregularities.   Left common femoral artery:  40-50% disease.  Left profunda femoral artery:  minor irregularities.  Left superficial femoral artery:   diffuse 40% disease proximally in the midsegment. There is diffuse 50-60% disease in the midsegment.   Left popliteal artery:  diffuse 60% disease.   Three-vessel runoff below the knee.    Conclusions: 1. Small infrarenal abdominal aortic aneurysm. 2. Severe bilateral renal artery stenosis. 3. Significant bilateral ostial common iliac artery stenosis extending into the distal aorta. 4. Significant right SFA disease with AV fistula originating from the proximal right popliteal artery. 5. Moderate left SFA and popliteal artery disease. 6. Successful kissing stent placement to the ostial common iliac arteries bilaterally.  Recommendations:  I will observe the patient overnight with hydration given his mild chronic kidney disease. His blood pressure needs to be monitored carefully and treated as needed given his previous history of small intracranial bleed related to uncontrolled hypertension and possibly dual antiplatelet therapy. I elected to treat the patient with  Plavix monotherapy.   the right AV fistula will likely require surgical treatment. I favor this over endovascular treatment given that this would likely require a covered stent and the need for dual antiplatelet therapy.   Treatment of renal artery stenosis is also likely indicated in the near future. Renal artery duplex and renal ultrasound will be requested to evaluate renal size.   Lorine Bears, MD, [facility] 12/26/2012 11:47 AM

## 2012-12-27 ENCOUNTER — Encounter (HOSPITAL_COMMUNITY): Payer: Self-pay | Admitting: Nurse Practitioner

## 2012-12-27 ENCOUNTER — Telehealth: Payer: Self-pay

## 2012-12-27 DIAGNOSIS — I2510 Atherosclerotic heart disease of native coronary artery without angina pectoris: Secondary | ICD-10-CM

## 2012-12-27 DIAGNOSIS — I739 Peripheral vascular disease, unspecified: Secondary | ICD-10-CM

## 2012-12-27 DIAGNOSIS — J449 Chronic obstructive pulmonary disease, unspecified: Secondary | ICD-10-CM | POA: Diagnosis not present

## 2012-12-27 DIAGNOSIS — I10 Essential (primary) hypertension: Secondary | ICD-10-CM

## 2012-12-27 DIAGNOSIS — I701 Atherosclerosis of renal artery: Secondary | ICD-10-CM | POA: Insufficient documentation

## 2012-12-27 LAB — BASIC METABOLIC PANEL
CO2: 24 mEq/L (ref 19–32)
Chloride: 109 mEq/L (ref 96–112)
GFR calc Af Amer: 63 mL/min — ABNORMAL LOW (ref >90)
Potassium: 4.4 mEq/L (ref 3.5–5.1)

## 2012-12-27 NOTE — Progress Notes (Signed)
   TELEMETRY: Reviewed telemetry pt in NSR: Filed Vitals:   12/26/12 1700 12/26/12 1948 12/27/12 0017 12/27/12 0424  BP: 147/61 142/48 146/62 137/59  Pulse: 82 79 73 70  Temp: 97.7 F (36.5 C) 98.1 F (36.7 C) 97.3 F (36.3 C) 97.4 F (36.3 C)  TempSrc: Oral Oral Oral Oral  Resp: 18 20 18 18   Height:      Weight:   165 lb 5.5 oz (75 kg)   SpO2: 95% 93% 97% 97%    Intake/Output Summary (Last 24 hours) at 12/27/12 0728 Last data filed at 12/27/12 0425  Gross per 24 hour  Intake 1141.25 ml  Output   1050 ml  Net  91.25 ml    SUBJECTIVE Feels well. No leg or groin pain. Urine output good. No dyspnea or chest pain.  LABS: Basic Metabolic Panel:  Recent Labs  84/69/62 0854 12/27/12 0600  NA 137 141  K 4.4 4.4  CL 105 109  CO2 22 24  GLUCOSE 89 89  BUN 21 17  CREATININE 1.32 1.28  CALCIUM 9.1 8.7    Radiology/Studies:  No results found.  PHYSICAL EXAM General: Well developed, well nourished, in no acute distress. Head: Normal Neck: left carotid and subclavian bruit. JVD not elevated. Lungs: Clear bilaterally to auscultation without wheezes, rales, or rhonchi. Breathing is unlabored. Heart: RRR S1 S2 with 1-2/6 SEM Abdomen: Soft, non-tender, non-distended with normoactive bowel sounds. No hepatomegaly.  Extremities: No clubbing, cyanosis or edema.  Femoral pulses are 2+ without groin hematoma bilaterally. Neuro: Alert and oriented X 3. Moves all extremities spontaneously. Psych:  Responds to questions appropriately with a normal affect.  ASSESSMENT AND PLAN: 1. PAD s/p bilateral iliac stents. Plan to treat with Plavix alone with history of intraventricular bleed.  2. CKD creatinine is stable post contrast 3. COPD 4. Bilateral RAS- plan renal artery duplex and renal ultrasound as outpatient. 5. Right popliteal AV fistula. Anticipate surgical repair per Dr. Jari Sportsman note. 6. HTN controlled. 7. CAD s/p CABG  Plan: DC home today. Follow up with Dr. Kirke Corin in 2  weeks.  Principal Problem:   Peripheral arterial disease Active Problems:   TOBACCO ABUSE   COPD (chronic obstructive pulmonary disease)   CAD (coronary artery disease), native coronary artery   HTN (hypertension)    Signed, Lavelle Akel [place] MD,FACC 12/27/2012 7:28 AM

## 2012-12-27 NOTE — Discharge Summary (Signed)
Patient seen and examined and history reviewed. Agree with above findings and plan. See earlier rounding note.   Thedora Hinders 12/27/2012 2:14 PM

## 2012-12-27 NOTE — Discharge Summary (Signed)
Patient ID: [name],  MRN: [id number], DOB/AGE: Sep 28, 1940 72 y.o.  Admit date: 12/26/2012 Discharge date: 12/27/2012  Primary Care Provider: Karleen Hampshire Copland Primary Cardiologist: Judie Petit. Kirke Corin, MD   Discharge Diagnoses Principal Problem:   Peripheral arterial disease  **s/p peripheral angiography with stenting of the bilateral ostial common iliac arteries this admission. Active Problems:   TOBACCO ABUSE   COPD (chronic obstructive pulmonary disease)   CAD (coronary artery disease), native coronary artery   HTN (hypertension)   Renal Artery Stenosis  Allergies Allergies  Allergen Reactions  . Plavix [Clopidogrel Bisulfate]     Brain hemorrhage prev while on plavix   Procedures  Peripheral Angiography and PTA/stenting 9.3.2014  Procedures Performed:  Bilateral femoral artery access  Abdominal Aortic Angiogram with Bi-Iliofemoral Runoff  Bilateral Lower Extremity Angiography (1st Order)  Selective right lower extremity angiography  Kissing stent placement to both common iliac arteries extending into the distal aorta.    Indication(s):    Severe Claudication   Consent: The procedure with Risks/Benefits/Alternatives and Indications was reviewed with the patient .  All questions were answered.  Medications:             Sedation:  3 mg IV Versed, 100 mcg IV Fentanyl                                  Contrast:  180 ml  Visipaque   Procedural details: The left groin was prepped, draped, and anesthetized with 1% lidocaine. Using modified Seldinger technique, a 5 French sheath was introduced into the left common common femoral artery. A 5 Fr Short Pigtail Catheter was advanced of over a  Versicore wire into the descending Aorta to a level just above the renal arteries. A power injection of 39ml/sec contrast over 1 sec was performed for Abdominal Aortic Angiography.  The catheter was then pulled back to a level just above the Aortic bifurcation, and a second power injection was  performed to evaluate the iliac arteries.   Bilateral lower extremity arterial run off was then performed via power injection of 7 ml / sec contrast for a total of 77 ml.   Interventional Procedure:    angiography showed confirmed significant ostial common iliac artery stenosis bilaterally extending into the distal aorta. There was incidental finding of AV fistula originating from the popliteal artery. I performed selective right lower extremity angiography after placing the sheath in the right common femoral artery.   I felt that treating this would be best done surgically given that this would require a covered stent and the need for dual antiplatelet therapy which he might not be able to tolerate due to his known history of intracranial bleed. I decided to proceed with treating his iliac disease today. I used a micropuncture needle and get access the right common femoral artery. The sheath was exchanged into a 7 French 35 cm bright tip sheath. A 5 French sheath in the left femoral artery was exchanged into a 7 French 35 cm bright tip sheath. A total of 4000 units of unfractionated heparin was given with an ACT of 247. Both sheaths were advanced to the distal aorta.The stents were deployed together to 8 atmospheres. The 2 balloons were advanced to the distal aorta and again inflated to 10 atmosphere. Final angiography showed excellent results. Both sheaths were left in place to be removed manually.   Hemodynamics:  Central Aortic Pressure / Mean Aortic Pressure:  141/56  Findings:  Abdominal aorta:  there is diffuse atherosclerosis and abdominal aorta with small infrarenal aneurysm. There is 60% stenosis in the distal aorta just above the bifurcation    Left renal artery: 90-95% proximal stenosis.    Right renal artery: 80-90% ostial stenosis.    Celiac artery:  Not well visualized  Superior mesenteric artery:  not well visualized.    Right common iliac artery:  80% ostial  stenosis.    Right internal iliac artery:  80% ostial stenosis.    Right external iliac artery:  minor irregularities.    Right common femoral artery:  minor irregularities.  Right profunda femoral artery:  mild diffuse disease throughout its course with 40% mid stenosis.  Right superficial femoral artery:  mild diffuse disease proximally followed by 40% diffuse disease in the midsegment. There is 70-80% discrete disease distally with diffuse 30-40% disease in the rest of the segment.  Right popliteal artery:  normal in size with diffuse atherosclerosis. An AV fistula is noted originating from the proximal popliteal artery.    Right tibial peroneal trunk:  minor irregularities.  Right anterior tibial artery:  normal in size with diffuse 50% disease proximally the vessel might be occluded a short segment and the mid area but not well visualized due to poor flow below the knee likely due to the AV fistula.    Right peroneal artery:  appears to be patent but underfilling    Right posterior tibial artery:  patent but underfilling.    Left common iliac artery:   there is 70% ostial stenosis.    Left internal iliac artery:  70% ostial stenosis.    Left external iliac artery:  minor irregularities.    Left common femoral artery:  40-50% disease.  Left profunda femoral artery:  minor irregularities.  Left superficial femoral artery:   diffuse 40% disease proximally in the midsegment. There is diffuse 50-60% disease in the midsegment.    Left popliteal artery:  diffuse 60% disease.    Three-vessel runoff below the knee.  **The bilateral ostial common iliac arteries were successfully stented using  8 x 29 mm Genesis balloon expandable stents.  Both stents were overlapped.**   Conclusions: 1. Small infrarenal abdominal aortic aneurysm. 2. Severe bilateral renal artery stenosis. 3. Significant bilateral ostial common iliac artery stenosis extending into the distal aorta. 4.  Significant right SFA disease with AV fistula originating from the proximal right popliteal artery. 5. Moderate left SFA and popliteal artery disease. 6. Successful kissing stent placement to the ostial common iliac arteries bilaterally.  Recommendations:   I will observe the patient overnight with hydration given his mild chronic kidney disease. His blood pressure needs to be monitored carefully and treated as needed given his previous history of small intracranial bleed related to uncontrolled hypertension and possibly dual antiplatelet therapy. I elected to treat the patient with Plavix monotherapy.    the right AV fistula will likely require surgical treatment. I favor this over endovascular treatment given that this would likely require a covered stent and the need for dual antiplatelet therapy.    Treatment of renal artery stenosis is also likely indicated in the near future. Renal artery duplex and renal ultrasound will be requested to evaluate renal size. _____________   History of Present Illness  72 year old male with history of peripheral arterial disease and lower extremity claudication who was recently seen in clinic by Dr. Kirke Corin secondary to recurrent claudication. Noninvasive  evaluation showed severe bilateral common iliac disease worse on the right with SFA disease on the right as well. Decision was made to pursue peripheral angiography.  Hospital Course  Patient underwent peripheral angiography on September 3, with complete details as outlined above. He was noted to have severe bilateral renal artery stenosis as well as bilateral common iliac disease. An AV fistula was noted in the right popliteal trunk. After some review, it was felt that he would benefit from stenting of the bilateral ostial common iliac arteries and this was successfully performed. It was felt that the right AV fistula will likely require surgical treatment while further evaluation of renal artery stenosis may be  indicated in the future. Post procedure, patient was maintained on Plavix therapy alone. Aspirin was not used as patient had prior intracranial hemorrhage in the setting of malignant hypertension while on both aspirin and Plavix. He has been doing always and will be discharged home today in good condition with outpatient peripheral vascular followup arranged in approximately 2 weeks.  Discharge Vitals Blood pressure 160/57, pulse 82, temperature 97.3 F (36.3 C), temperature source Oral, resp. rate 18, height 5\' 9"  (1.753 m), weight 165 lb 5.5 oz (75 kg), SpO2 97.00%.  Filed Weights   12/26/12 0625 12/27/12 0017  Weight: 165 lb (74.844 kg) 165 lb 5.5 oz (75 kg)    Labs  Basic Metabolic Panel  Recent Labs  12/26/12 0854 12/27/12 0600  NA 137 141  K 4.4 4.4  CL 105 109  CO2 22 24  GLUCOSE 89 89  BUN 21 17  CREATININE 1.32 1.28  CALCIUM 9.1 8.7   Disposition  Pt is being discharged home today in good condition.  Follow-up Plans & Appointments  Follow-up Information   Follow up with Lorine Bears, MD On 01/10/2013. (10:45 AM)    Specialty:  Cardiology   Contact information:   [facility] [address] [place] [address] [phone]       Follow up with [name], MD. (as scheduled.)    Specialty:  Family Medicine   Contact information:   [address] [place] [address] [place] [address] [phone]      Discharge Medications    Medication List    STOP taking these medications       aspirin EC 81 MG tablet      TAKE these medications       amLODipine 10 MG tablet  Commonly known as:  NORVASC  Take 1 tablet (10 mg total) by mouth daily.     atorvastatin 20 MG tablet  Commonly known as:  LIPITOR  Take 1 tablet (20 mg total) by mouth daily at 6 PM.     carvedilol 3.125 MG tablet  Commonly known as:  COREG  Take 1 tablet (3.125 mg total) by mouth 2 (two) times daily with a meal.     clopidogrel  75 MG tablet  Commonly known as:  PLAVIX  Take 1 tablet (75 mg total) by mouth daily.     levothyroxine 200 MCG tablet  Commonly known as:  SYNTHROID, LEVOTHROID  Take 200 mcg by mouth daily.     lisinopril 5 MG tablet  Commonly known as:  PRINIVIL,ZESTRIL  Take 1 tablet (5 mg total) by mouth daily.        Outstanding Labs/Studies  Renal artery duplex and renal ultrasound to be scheduled at f/u.  Duration of Discharge Encounter   Greater than 30 minutes including  physician time.  Signed, Nicolasa Ducking NP 12/27/2012, 10:50 AM

## 2012-12-27 NOTE — Telephone Encounter (Signed)
Patient contacted regarding discharge from [facility] on 12/26/12.  Patient understands to follow up with provider Dr. Kirke Corin on 01/10/13 at 10:45 at [facility] office. Patient understands discharge instructions? yes Patient understands medications and regiment? yes Patient understands to bring all medications to this visit? yes  Spoke w/ pt's wife who said the he has been very sleepy since he returned home from the hospital.

## 2012-12-27 NOTE — Telephone Encounter (Signed)
Message copied by Marilynne Halsted on Thu Dec 27, 2012 11:34 AM ------      Message from: Christell Faith      Created: Thu Dec 27, 2012 10:48 AM      Regarding: tcm/ph       01/10/13 10:45 Dr. Kirke Corin ------

## 2012-12-27 NOTE — Telephone Encounter (Signed)
Patient contacted regarding discharge from [facility] on 12/26/12.  Lmom

## 2013-01-09 ENCOUNTER — Encounter (INDEPENDENT_AMBULATORY_CARE_PROVIDER_SITE_OTHER): Payer: Medicare Other

## 2013-01-09 DIAGNOSIS — I739 Peripheral vascular disease, unspecified: Secondary | ICD-10-CM | POA: Diagnosis not present

## 2013-01-10 ENCOUNTER — Encounter: Payer: Self-pay | Admitting: Cardiovascular Disease

## 2013-01-10 ENCOUNTER — Ambulatory Visit (INDEPENDENT_AMBULATORY_CARE_PROVIDER_SITE_OTHER): Payer: Medicare Other | Admitting: Cardiovascular Disease

## 2013-01-10 VITALS — BP 133/66 | HR 73 | Ht 69.0 in | Wt 166.2 lb

## 2013-01-10 DIAGNOSIS — I2510 Atherosclerotic heart disease of native coronary artery without angina pectoris: Secondary | ICD-10-CM

## 2013-01-10 DIAGNOSIS — I701 Atherosclerosis of renal artery: Secondary | ICD-10-CM | POA: Diagnosis not present

## 2013-01-10 DIAGNOSIS — I739 Peripheral vascular disease, unspecified: Secondary | ICD-10-CM | POA: Diagnosis not present

## 2013-01-10 NOTE — Assessment & Plan Note (Signed)
He reports resolution of claudication after recent bilateral common iliac artery stenting extending into the distal aorta. He seems to be tolerating Plavix monotherapy without any reported side effects. He reports much more bruising when he was on aspirin. I will plan on keeping him on Plavix for now. Postprocedure ABI showed improvement on the right side but not back to normal as expected given his SFA disease bilaterally which was not treated at this point. He also has an AV fistula originating from the right proximal popliteal artery. The plan is to monitor his symptoms. If he develops significant calf claudication especially on the right side, I will consider proceeding with a covered stent placement to the distal SFA/proximal popliteal artery in order to treat the stenosis as well as the AV fistula. This can be only performed via an antegrade access.

## 2013-01-10 NOTE — Assessment & Plan Note (Signed)
He has no symptoms suggestive of angina. I asked him to resume his exercise given that his claudication has resolved.

## 2013-01-10 NOTE — Patient Instructions (Addendum)
Your physician has requested that you have a renal artery duplex. During this test, an ultrasound is used to evaluate blood flow to the kidneys. Allow one hour for this exam. Do not eat after midnight the day before and avoid carbonated beverages. Take your medications as you usually do.  Schedule the above test in 2 months from now.   Continue same medications.   Follow up in 3 months.

## 2013-01-10 NOTE — Assessment & Plan Note (Signed)
He does have mild chronic kidney disease which is likely due to ischemic nephropathy. This actually has improved over the last 6 months. There is likely an indication for revascularization to preserve renal function. The stenosis appeared to be worse on the left side with smaller kidney size. Thus, I will request a renal artery duplex ultrasound to make the physiologic significance and kidney size before making final decisions.

## 2013-01-10 NOTE — Progress Notes (Signed)
Primary care physician: Dr. Patsy Lager Primary cardiologist: Dr. Mariah Milling  HPI  [name] is a pleasant 72 year old gentleman who is here today for a followup visit regarding peripheral arterial disease with previous stenting of the left subclavian artery and left axillary artery and significant lower extremity claudication.  The patient has multiple medical problems including hyperlipidemia, long smoking history for 60 years, COPD , stent to his left lower extremity for claudication done years ago by Dr. Evette Cristal,   carotid showed 40% to 59% bilateral disease, and atherosclerotic coronary artery disease status post CABG in March of 2013 with LIMA to the LAD, vein graft to the PDA, vein graft to the OM. He does have mild aortic valve stenosis.  He was seen in February, 2014 for worsening left subclavian artery stenosis with severe left arm claudication with a 54 mm pressure difference in both arms. I performed aortic arch angiography which showed severe ostial left subclavian artery stenosis and severe left proximal axillary artery stenosis. He underwent successful self-expanding stent placement to the axillary artery balloon expandable stent to the ostial subclavian artery. He was admitted to [facility] in March for her headache and was found to have a small intracranial hemorrhage which was suspected due to malignant hypertension and dual antiplatelet therapy. He was treated conservatively and discharged home off antiplatelet medications. He called our office shortly after discharge with right leg discomfort and swelling. Venous Doppler ultrasound showed extensive DVT. This was a spontanous DVT as there was no instrumentation or manual pressure on that side. Anticoagulation was contraindicated and thus an IVC filter was placed.  He was seen recently for worsening lower extremity claudication worse on the right side with evidence of iliac disease. I proceeded with angiography which showed significant  bilateral renal artery stenosis worse on the left side, significant distal aortic stenosis extending into ostial common iliac artery bilaterally, significant distal right SFA disease with evidence of AV fistula from the proximal popliteal artery. There was diffuse left SFA disease.  I performed successful kissing stent placement to bilateral common iliac arteries extending into the distal aorta without complications. Since then, he reports resolution of claudication.    Allergies  Allergen Reactions  . Plavix [Clopidogrel Bisulfate]     Brain hemorrhage prev while on plavix     Current Outpatient Prescriptions on File Prior to Visit  Medication Sig Dispense Refill  . amLODipine (NORVASC) 10 MG tablet Take 1 tablet (10 mg total) by mouth daily.  30 tablet  3  . atorvastatin (LIPITOR) 20 MG tablet Take 1 tablet (20 mg total) by mouth daily at 6 PM.  90 tablet  3  . carvedilol (COREG) 3.125 MG tablet Take 1 tablet (3.125 mg total) by mouth 2 (two) times daily with a meal.  180 tablet  3  . clopidogrel (PLAVIX) 75 MG tablet Take 1 tablet (75 mg total) by mouth daily.  30 tablet  3  . levothyroxine (SYNTHROID, LEVOTHROID) 200 MCG tablet Take 200 mcg by mouth daily.      Marland Kitchen lisinopril (PRINIVIL,ZESTRIL) 5 MG tablet Take 1 tablet (5 mg total) by mouth daily.  90 tablet  3  . [DISCONTINUED] Fluticasone-Salmeterol (ADVAIR DISKUS) 250-50 MCG/DOSE AEPB Inhale 1 puff into the lungs 2 (two) times daily.  1 each  11  . [DISCONTINUED] pravastatin (PRAVACHOL) 40 MG tablet Take 1 tablet (40 mg total) by mouth daily.  30 tablet  6   No current facility-administered medications on file prior to visit.  Past Medical History  Diagnosis Date  . Venous insufficiency   . Peripheral arterial disease     a. Previous left lower extremity stenting by Dr. Evette Cristal;  b. 12/2012 s/p bilat ostial common iliac stenting.  . Tobacco abuse   . COPD (chronic obstructive pulmonary disease)   . Hypothyroidism   .  Subclavian artery stenosis, left 05/2012    Status post stenting of the ostium and self-expanding stent placement to the left axillary artery  . Right leg DVT 08/13/2012  . Intraventricular hemorrhage 07/12/2012  . Hypertension   . CHF (congestive heart failure)   . Arthritis     "hands; not dx'd" (12/26/2012)  . Bilateral renal artery stenosis      Past Surgical History  Procedure Laterality Date  . Angioplasty / stenting femoral  05/2012  . Coronary artery bypass graft  07/11/2011    Procedure: CORONARY ARTERY BYPASS GRAFTING (CABG);  Surgeon: [name], MD;  Location: [facility] OR;  Service: Open Heart Surgery;  Laterality: N/A;  Times 3. On Pump. Using right greater saphenous vein and left internal mammary artery.   . Cardiac catheterization  2/14    [facility]  . Subclavian artery stent  05/2012    "2 stents" (12/26/2012)  . Vena cava filter placement  07/2012    Removable  . Angioplasty / stenting iliac Bilateral 12/26/2012  . Cholecystectomy  1990's     Family History  Problem Relation Age of Onset  . Alcohol abuse Father   . Cirrhosis Father   . Hypothyroidism Sister      History   Social History  . Marital Status: Married    Spouse Name: N/A    Number of Children: 2  . Years of Education: N/A   Occupational History  . Retired     Comptroller   Social History Main Topics  . Smoking status: Former Smoker -- 1.00 packs/day for 50 years    Types: Cigarettes    Quit date: 04/27/2011  . Smokeless tobacco: Never Used  . Alcohol Use: No  . Drug Use: No  . Sexual Activity: Not Currently   Other Topics Concern  . Not on file   Social History Narrative   Exercsiing 3 times a week.    Moderate diet control.    O living will, no HCPOA.      PHYSICAL EXAM   BP 133/66  Pulse 73  Ht 5\' 9"  (1.753 m)  Wt 166 lb 4 oz (75.411 kg)  BMI 24.54 kg/m2 Constitutional: He is oriented to person, place, and time. He appears well-developed and well-nourished. No distress.   HENT: No nasal discharge.  Head: Normocephalic and atraumatic.  Eyes: Pupils are equal and round. Right eye exhibits no discharge. Left eye exhibits no discharge.  Neck: Normal range of motion. Neck supple. No JVD present. No thyromegaly present. There is left carotid bruit. There is a bruit in the left subclavian artery area. Cardiovascular: Normal rate, regular rhythm, normal heart sounds and. Exam reveals no gallop and no friction rub. There is 2/6 systolic ejection murmur at the aortic area.  Pulmonary/Chest: Effort normal and breath sounds normal. No stridor. No respiratory distress. He has no wheezes. He has no rales. He exhibits no tenderness.  Abdominal: Soft. Bowel sounds are normal. He exhibits no distension. There is no tenderness. There is no rebound and no guarding.  Musculoskeletal: Normal range of motion. He exhibits no edema and no tenderness.  Neurological: He is alert and oriented to  person, place, and time. Coordination normal.  Skin: Skin is warm and dry. No rash noted. He is not diaphoretic. No erythema. No pallor.  Psychiatric: He has a normal mood and affect. His behavior is normal. Judgment and thought content normal.  Vascular: Right radial pulse is normal. Left radial pulse is normal. Femoral pulse is: Normal bilaterally with faint bruits. Distal pulses are not palpable.      ASSESSMENT AND PLAN

## 2013-02-02 IMAGING — CR DG CHEST 1V PORT
1 series · 1 of 1 positions shown · non-contrast
Comparison: 07/13/2011; 07/12/2011

CLINICAL DATA: Post CABG

PORTABLE CHEST - 1 VIEW

[AP]
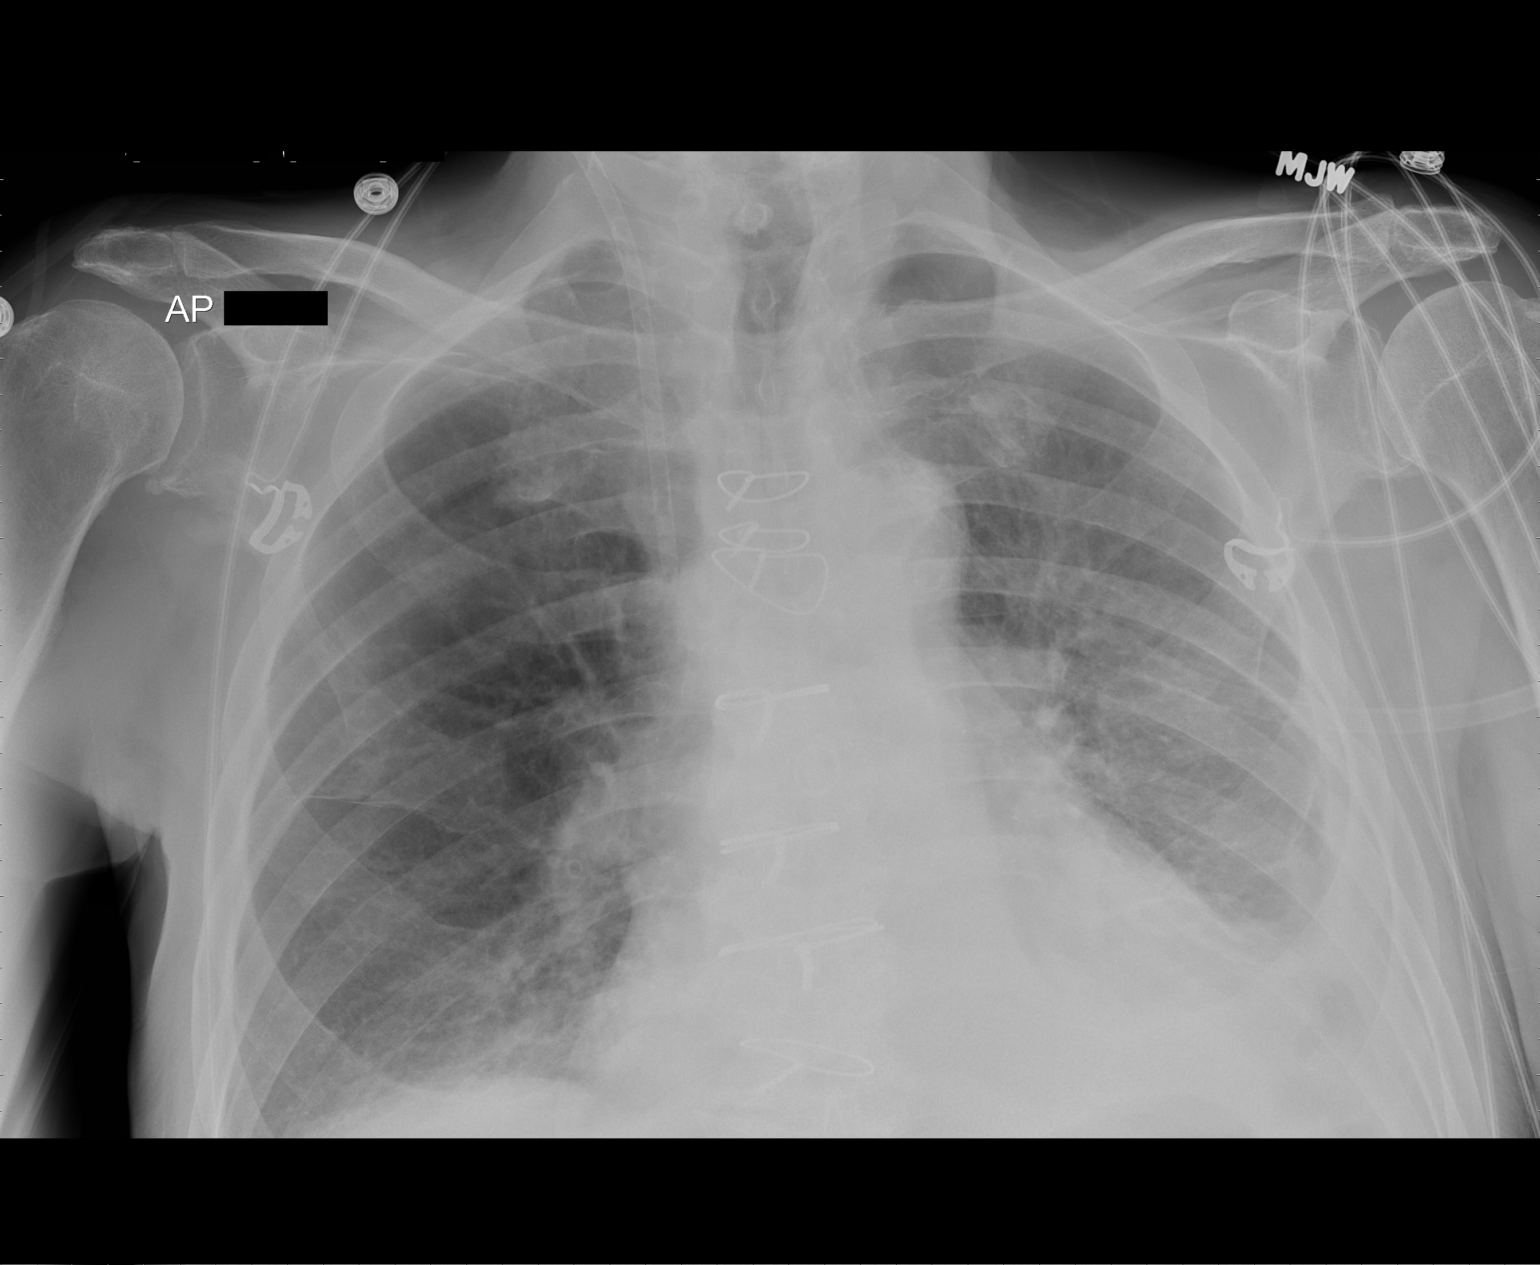

[1 of 1 positions shown; findings below may reference images not displayed]

FINDINGS: Grossly unchanged and enlarged cardiac silhouette and
mediastinal contours post median sternotomy and CABG.  Stable
position of support apparatus.  No pneumothorax.  Grossly unchanged
small bilateral effusions, left greater than right.  Peculiar
lucency at the left costophrenic angle is unchanged.  Grossly
unchanged basilar opacities.  Grossly unchanged bones.
IMPRESSION: 1.  Unchanged appearance of tiny lucency at the left costophrenic
angle which may represent either a portion of aerated lung versus a
tiny loculated hydropneumothorax post recent chest tube removal.
2.  Grossly unchanged small bilateral effusions and basilar
opacities, left greater than right, favored to represent
atelectasis.

## 2013-02-05 ENCOUNTER — Other Ambulatory Visit: Payer: Self-pay | Admitting: *Deleted

## 2013-02-05 MED ORDER — LEVOTHYROXINE SODIUM 200 MCG PO TABS: 200.0000 ug | ORAL_TABLET | Freq: Every day | ORAL | Status: DC

## 2013-02-07 IMAGING — CR DG CHEST 2V
2 series · 2 of 2 positions shown · non-contrast
Comparison: 07/15/2011

CLINICAL DATA: CABG, shortness of breath.

CHEST - 2 VIEW

[w chest pa]
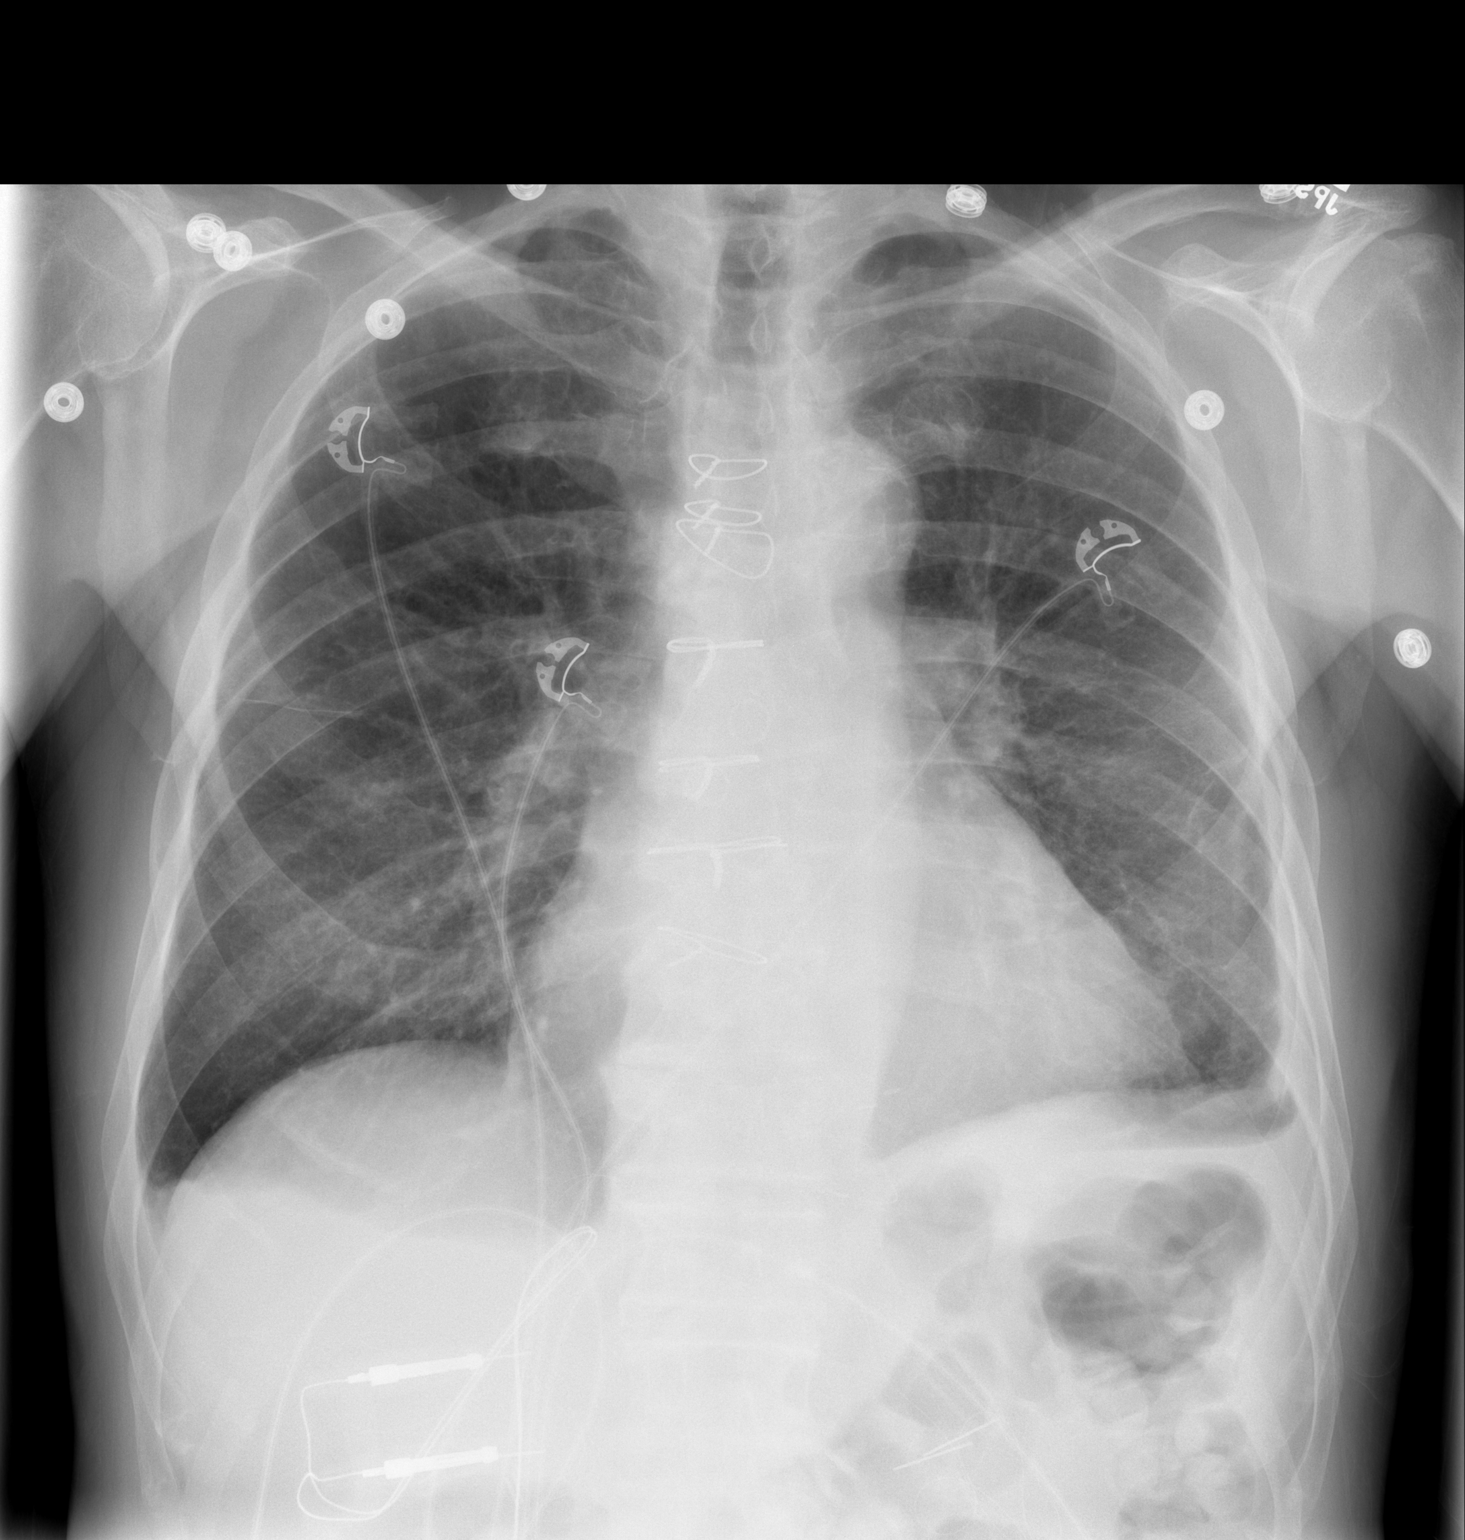

[w chest lat]
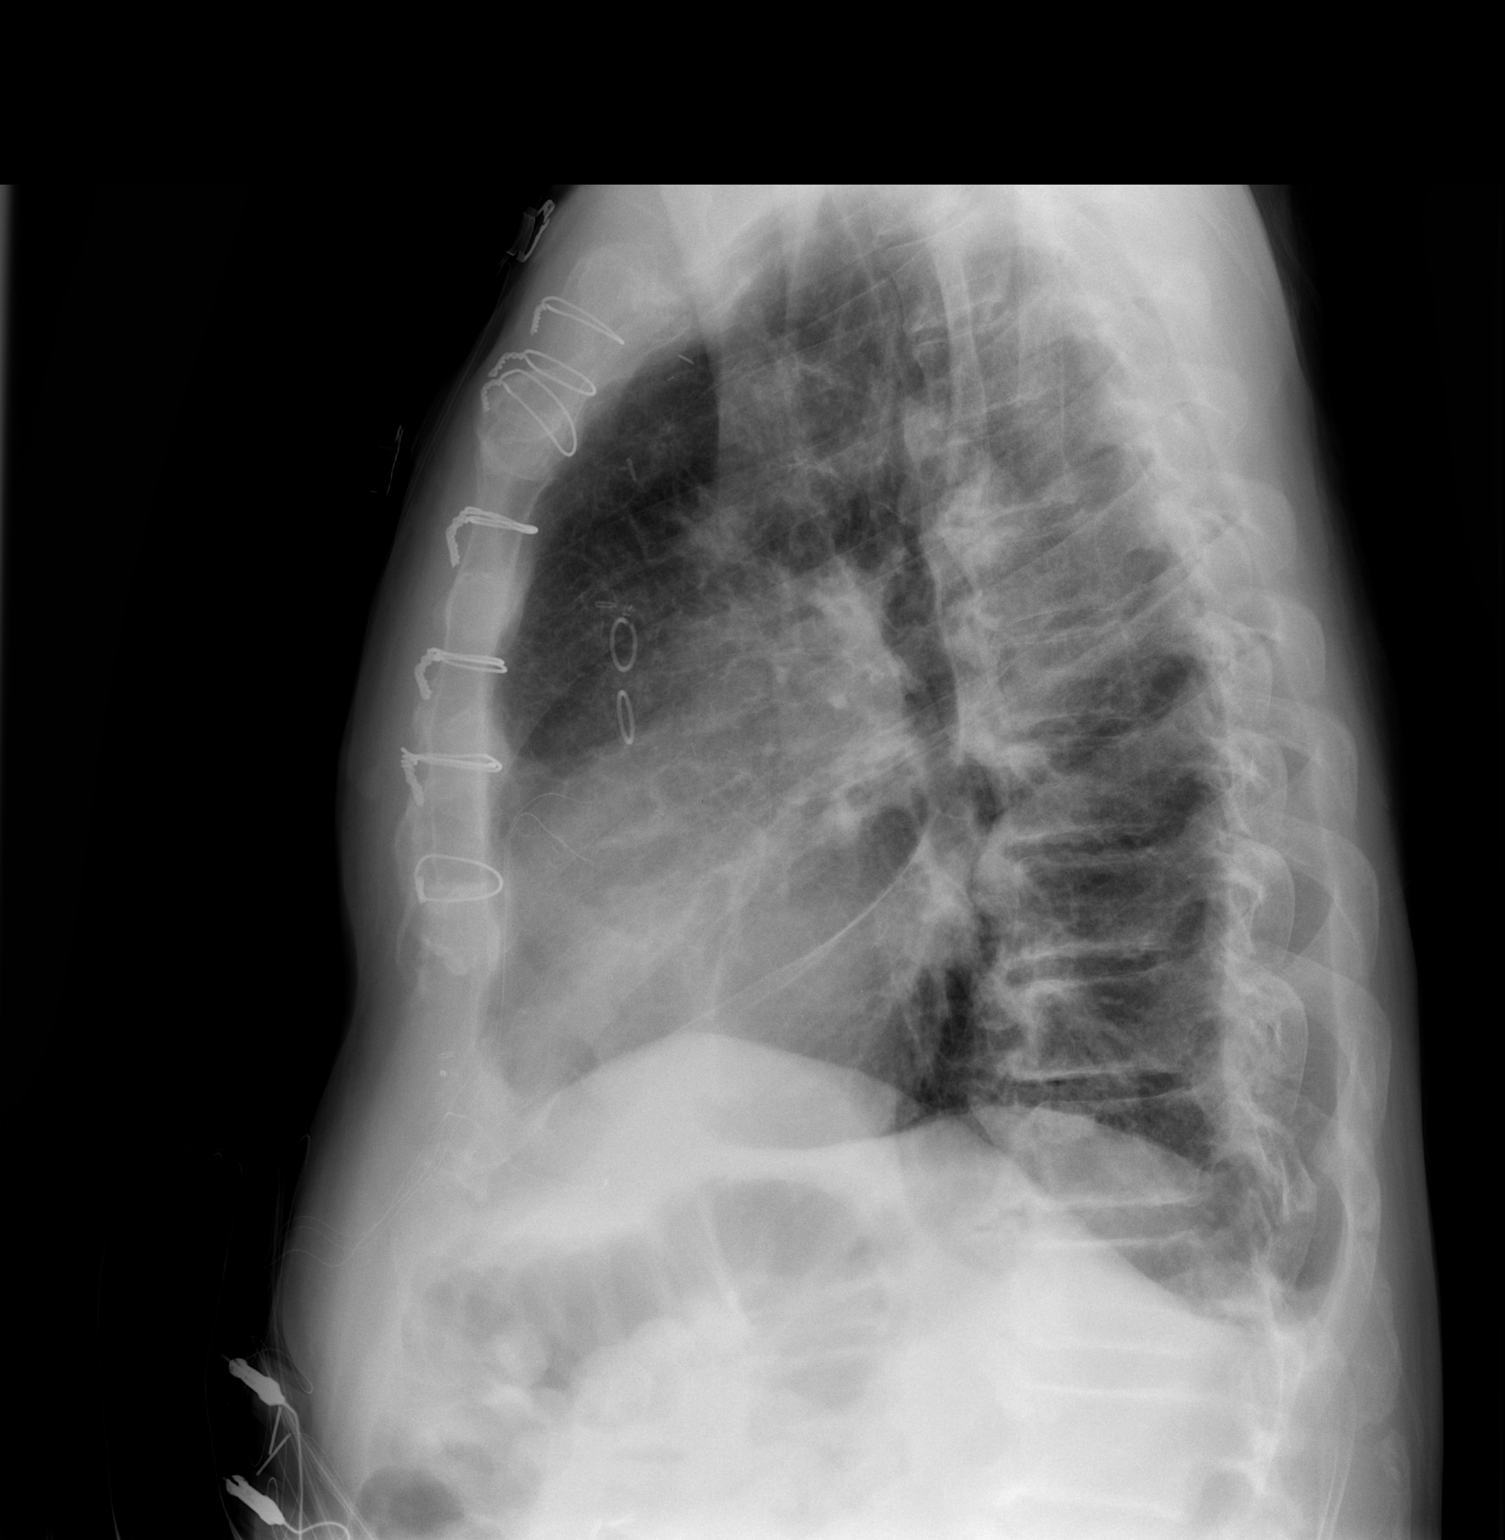

[2 of 2 positions shown; findings below may reference images not displayed]

FINDINGS: Prior CABG.  Cardiomegaly.  Improving bibasilar opacities
and small bilateral effusions.  No pneumothorax.
IMPRESSION: Improving bibasilar atelectasis and small effusions.

## 2013-02-22 ENCOUNTER — Other Ambulatory Visit: Payer: Self-pay | Admitting: *Deleted

## 2013-02-22 MED ORDER — ATORVASTATIN CALCIUM 20 MG PO TABS: 20.0000 mg | ORAL_TABLET | Freq: Every day | ORAL | Status: DC

## 2013-02-22 NOTE — Telephone Encounter (Signed)
Requested Prescriptions   Signed Prescriptions Disp Refills  . atorvastatin (LIPITOR) 20 MG tablet 90 tablet 3    Sig: Take 1 tablet (20 mg total) by mouth daily at 6 PM.    Authorizing Provider: Lorine Bears A    Ordering User: Kendrick Fries

## 2013-02-25 DIAGNOSIS — M79609 Pain in unspecified limb: Secondary | ICD-10-CM | POA: Diagnosis not present

## 2013-02-25 DIAGNOSIS — I824Y9 Acute embolism and thrombosis of unspecified deep veins of unspecified proximal lower extremity: Secondary | ICD-10-CM | POA: Diagnosis not present

## 2013-02-25 DIAGNOSIS — I10 Essential (primary) hypertension: Secondary | ICD-10-CM | POA: Diagnosis not present

## 2013-02-25 DIAGNOSIS — M7989 Other specified soft tissue disorders: Secondary | ICD-10-CM | POA: Diagnosis not present

## 2013-02-28 ENCOUNTER — Other Ambulatory Visit: Payer: Self-pay

## 2013-03-16 IMAGING — CR DG CHEST 2V
2 series · 2 of 2 positions shown · non-contrast
Comparison: 08/08/2011.

CLINICAL DATA: Recent heart surgery.

CHEST - 2 VIEW

[view not recorded (1 of 2)]
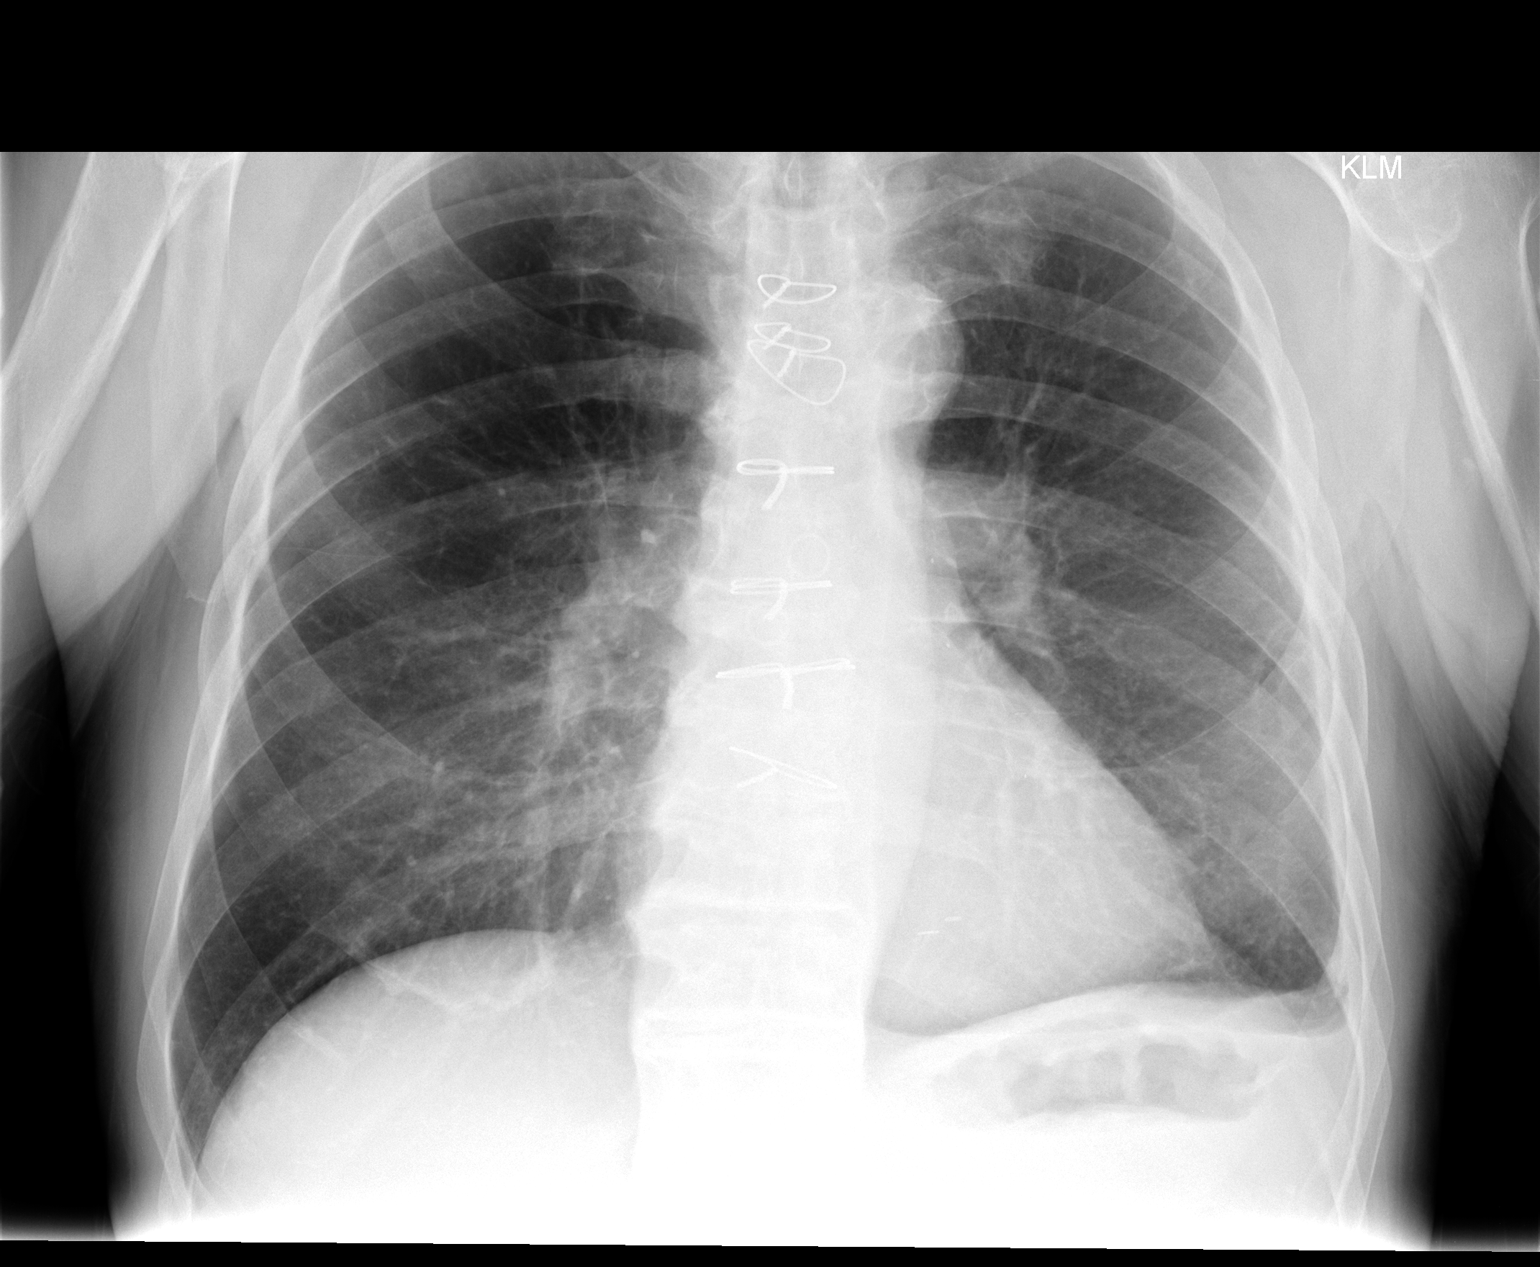

[view not recorded (2 of 2)]
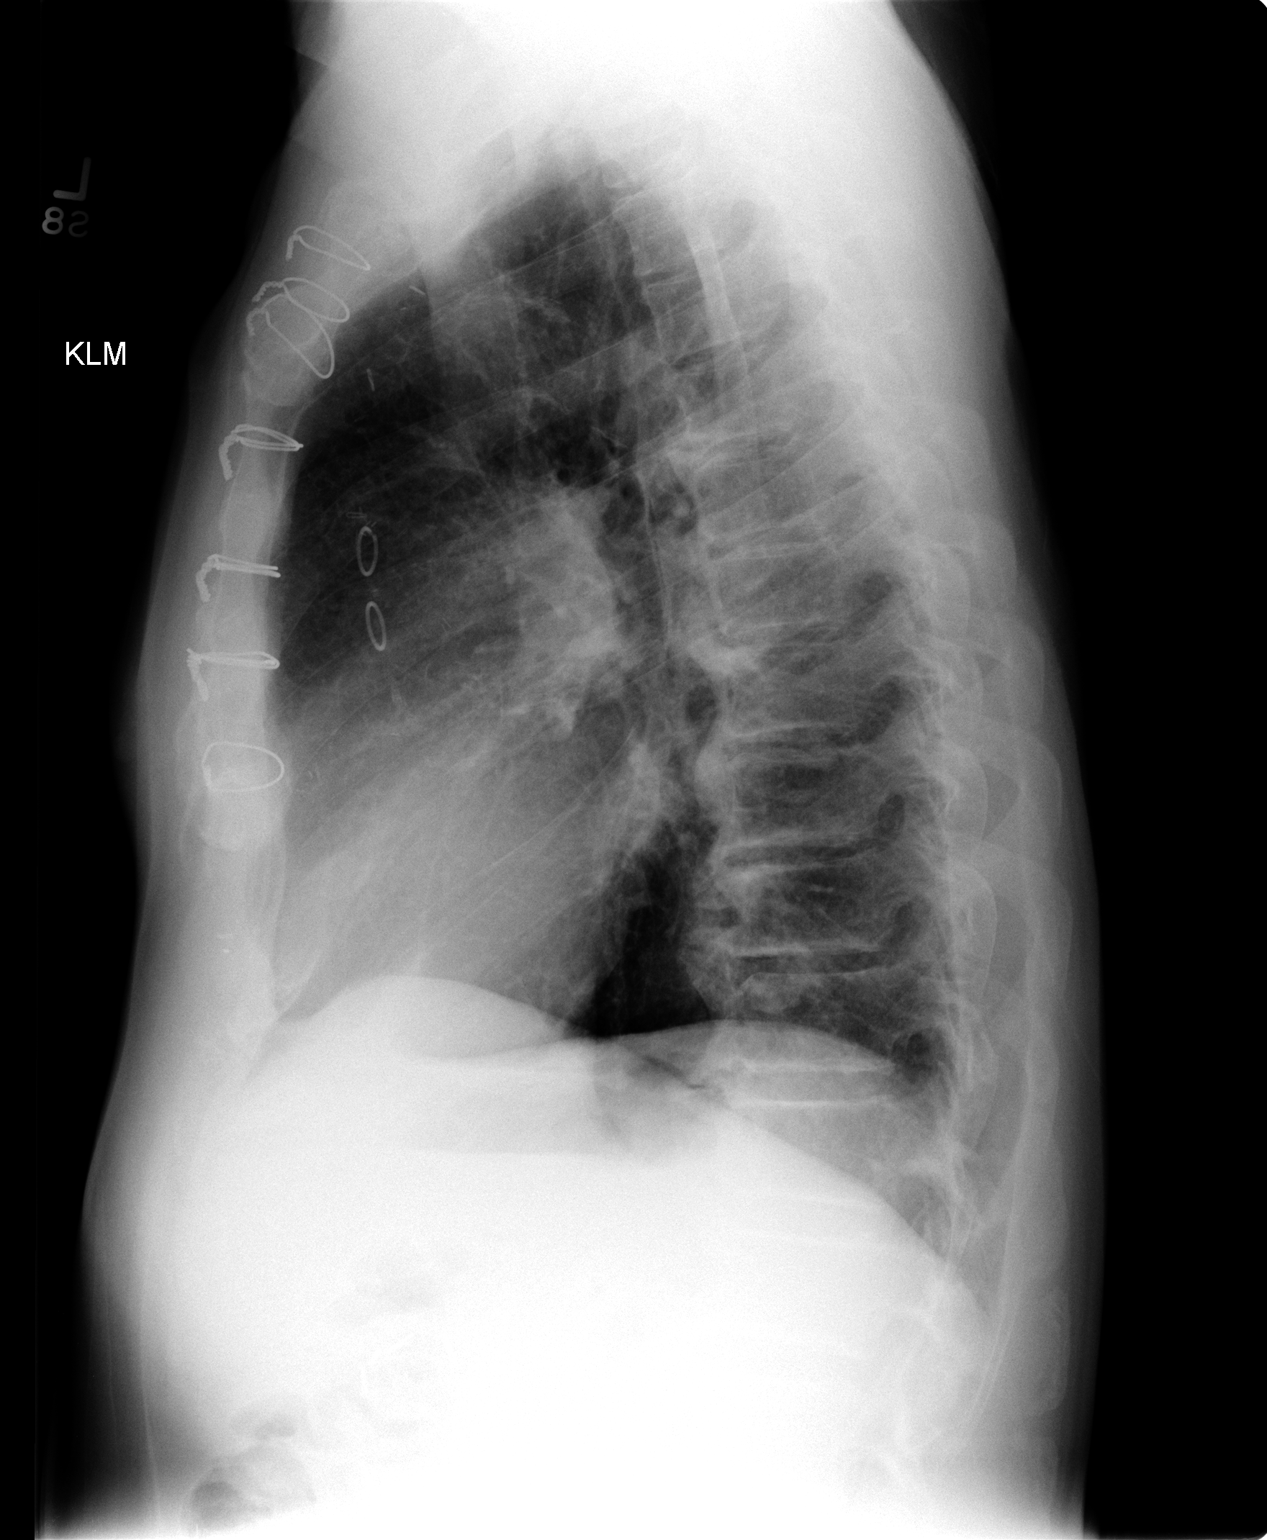

[2 of 2 positions shown; findings below may reference images not displayed]

FINDINGS: Trachea is midline.  Heart size stable.  Sternotomy wires
are stable in position. Probable mild pleural parenchymal scarring
at the base of the left hemithorax.  Lungs are hyperinflated but
otherwise clear.  Degenerative changes are seen in the spine.
IMPRESSION: Probable developing pleural parenchymal scarring at the base of the
left hemithorax.

## 2013-03-18 ENCOUNTER — Other Ambulatory Visit: Payer: Self-pay

## 2013-03-18 MED ORDER — AMLODIPINE BESYLATE 10 MG PO TABS: 10.0000 mg | ORAL_TABLET | Freq: Every day | ORAL | Status: DC

## 2013-03-18 NOTE — Telephone Encounter (Signed)
Requested Prescriptions   Signed Prescriptions Disp Refills  . amLODipine (NORVASC) 10 MG tablet 30 tablet 3    Sig: Take 1 tablet (10 mg total) by mouth daily.    Authorizing Provider: ARIDA, MUHAMMAD A    Ordering User: Athira Janowicz C    

## 2013-03-28 ENCOUNTER — Other Ambulatory Visit: Payer: Self-pay

## 2013-03-28 ENCOUNTER — Encounter (INDEPENDENT_AMBULATORY_CARE_PROVIDER_SITE_OTHER): Payer: Medicare Other

## 2013-03-28 DIAGNOSIS — I701 Atherosclerosis of renal artery: Secondary | ICD-10-CM

## 2013-03-28 DIAGNOSIS — I714 Abdominal aortic aneurysm, without rupture, unspecified: Secondary | ICD-10-CM | POA: Diagnosis not present

## 2013-03-28 DIAGNOSIS — I739 Peripheral vascular disease, unspecified: Secondary | ICD-10-CM | POA: Diagnosis not present

## 2013-03-28 MED ORDER — CLOPIDOGREL BISULFATE 75 MG PO TABS: 75.0000 mg | ORAL_TABLET | Freq: Every day | ORAL | Status: DC

## 2013-03-28 NOTE — Telephone Encounter (Signed)
Refill sent for plavix  

## 2013-04-11 ENCOUNTER — Ambulatory Visit (INDEPENDENT_AMBULATORY_CARE_PROVIDER_SITE_OTHER): Payer: Medicare Other | Admitting: Cardiovascular Disease

## 2013-04-11 ENCOUNTER — Encounter: Payer: Self-pay | Admitting: Cardiovascular Disease

## 2013-04-11 ENCOUNTER — Telehealth: Payer: Self-pay | Admitting: *Deleted

## 2013-04-11 VITALS — BP 137/74 | HR 72 | Ht 69.0 in | Wt 172.2 lb

## 2013-04-11 DIAGNOSIS — I2510 Atherosclerotic heart disease of native coronary artery without angina pectoris: Secondary | ICD-10-CM

## 2013-04-11 DIAGNOSIS — I10 Essential (primary) hypertension: Secondary | ICD-10-CM

## 2013-04-11 DIAGNOSIS — I701 Atherosclerosis of renal artery: Secondary | ICD-10-CM

## 2013-04-11 DIAGNOSIS — I739 Peripheral vascular disease, unspecified: Secondary | ICD-10-CM | POA: Diagnosis not present

## 2013-04-11 NOTE — Telephone Encounter (Signed)
Yes. That is fine.  

## 2013-04-11 NOTE — Assessment & Plan Note (Signed)
Renal artery duplex showed bilateral renal artery stenosis significantly worse on the left side. This correlated with angiography which was reviewed with him today. There is at least 90% stenosis in the left renal artery and 60-70% stenosis in the right renal artery. The left kidney was smaller than the right kidney by 2 cm. Peak systolic velocity was greater than 500. I discussed with him the indications for renal artery stenting. There is no urgent indication at this point given that his blood pressure is reasonably controlled and his renal function is close to normal. However, I do think he is at risk of losing function in the left kidney completely due to severity of stenosis. I think he will need stenting of the left renal artery and vein and a future and this was discussed with him. I will have him come back in 3 months. Continue current medications for now.

## 2013-04-11 NOTE — Progress Notes (Signed)
Primary care physician: Dr. Patsy Lager  HPI  [name] is a pleasant 72 year old gentleman who is here today for a followup visit regarding peripheral arterial disease with previous stenting of the left subclavian artery and left axillary artery and bilateral common iliac artery stenting. The patient has multiple medical problems including hyperlipidemia, long smoking history for 60 years, COPD ,   carotid showed 40% to 59% bilateral disease, and atherosclerotic coronary artery disease status post CABG in March of 2013 with LIMA to the LAD, vein graft to the PDA, vein graft to the OM. He does have mild aortic valve stenosis.  He was seen in February, 2014 for worsening left subclavian artery stenosis with severe left arm claudication with a 54 mm pressure difference in both arms. I performed aortic arch angiography which showed severe ostial left subclavian artery stenosis and severe left proximal axillary artery stenosis. He underwent successful self-expanding stent placement to the axillary artery balloon expandable stent to the ostial subclavian artery. He was admitted to [facility] in March for her headache and was found to have a small intracranial hemorrhage which was suspected due to malignant hypertension and dual antiplatelet therapy. He was treated conservatively and discharged home off antiplatelet medications. Shortly after that he was diagnosed with extensive right-sided DVT. Anticoagulation was contraindicated and thus an IVC filter was placed.  He underwent bilateral common iliac artery stenting in September of 2014 due to lifestyle limiting claudication. Angiography at that time showed significant bilateral renal artery stenosis worse on the left side, significant distal aortic stenosis extending into ostial common iliac artery bilaterally, significant distal right SFA disease with evidence of AV fistula from the proximal popliteal artery. There was diffuse left SFA disease.  His  claudication improved significantly since then with minimal symptoms at this time. He denies any chest pain. Overall he has been improving gradually. Blood pressure has been reasonably controlled. He is on Plavix monotherapy. He reports an itchy rash on his back over the last one month.   Allergies  Allergen Reactions  . Plavix [Clopidogrel Bisulfate]     Brain hemorrhage prev while on plavix     Current Outpatient Prescriptions on File Prior to Visit  Medication Sig Dispense Refill  . amLODipine (NORVASC) 10 MG tablet Take 1 tablet (10 mg total) by mouth daily.  30 tablet  3  . atorvastatin (LIPITOR) 20 MG tablet Take 1 tablet (20 mg total) by mouth daily at 6 PM.  90 tablet  3  . carvedilol (COREG) 3.125 MG tablet Take 1 tablet (3.125 mg total) by mouth 2 (two) times daily with a meal.  180 tablet  3  . clopidogrel (PLAVIX) 75 MG tablet Take 1 tablet (75 mg total) by mouth daily.  30 tablet  6  . levothyroxine (SYNTHROID, LEVOTHROID) 200 MCG tablet Take 1 tablet (200 mcg total) by mouth daily.  30 tablet  5  . lisinopril (PRINIVIL,ZESTRIL) 5 MG tablet Take 1 tablet (5 mg total) by mouth daily.  90 tablet  3  . [DISCONTINUED] Fluticasone-Salmeterol (ADVAIR DISKUS) 250-50 MCG/DOSE AEPB Inhale 1 puff into the lungs 2 (two) times daily.  1 each  11  . [DISCONTINUED] pravastatin (PRAVACHOL) 40 MG tablet Take 1 tablet (40 mg total) by mouth daily.  30 tablet  6   No current facility-administered medications on file prior to visit.     Past Medical History  Diagnosis Date  . Venous insufficiency   . Peripheral arterial disease  a. Previous left lower extremity stenting by Dr. Evette Cristal;  b. 12/2012 s/p bilat ostial common iliac stenting.  . Tobacco abuse   . COPD (chronic obstructive pulmonary disease)   . Hypothyroidism   . Subclavian artery stenosis, left 05/2012    Status post stenting of the ostium and self-expanding stent placement to the left axillary artery  . Right leg DVT  08/13/2012  . Intraventricular hemorrhage 07/12/2012  . Hypertension   . CHF (congestive heart failure)   . Arthritis     "hands; not dx'd" (12/26/2012)  . Bilateral renal artery stenosis      Past Surgical History  Procedure Laterality Date  . Angioplasty / stenting femoral  05/2012  . Coronary artery bypass graft  07/11/2011    Procedure: CORONARY ARTERY BYPASS GRAFTING (CABG);  Surgeon: [name], MD;  Location: [facility];  Service: Open Heart Surgery;  Laterality: N/A;  Times 3. On Pump. Using right greater saphenous vein and left internal mammary artery.   . Cardiac catheterization  2/14    [facility]  . Subclavian artery stent  05/2012    "2 stents" (12/26/2012)  . Vena cava filter placement  07/2012    Removable  . Angioplasty / stenting iliac Bilateral 12/26/2012  . Cholecystectomy  1990's     Family History  Problem Relation Age of Onset  . Alcohol abuse Father   . Cirrhosis Father   . Hypothyroidism Sister      History   Social History  . Marital Status: Married    Spouse Name: N/A    Number of Children: 2  . Years of Education: N/A   Occupational History  . Retired     Comptroller   Social History Main Topics  . Smoking status: Former Smoker -- 1.00 packs/day for 50 years    Types: Cigarettes    Quit date: 04/27/2011  . Smokeless tobacco: Never Used  . Alcohol Use: No  . Drug Use: No  . Sexual Activity: Not Currently   Other Topics Concern  . Not on file   Social History Narrative   Exercsiing 3 times a week.    Moderate diet control.    O living will, no HCPOA.      PHYSICAL EXAM   BP 137/74  Pulse 72  Ht 5\' 9"  (1.753 m)  Wt 172 lb 4 oz (78.132 kg)  BMI 25.43 kg/m2 Constitutional: He is oriented to person, place, and time. He appears well-developed and well-nourished. No distress.  HENT: No nasal discharge.  Head: Normocephalic and atraumatic.  Eyes: Pupils are equal and round. Right eye exhibits no discharge. Left eye exhibits no  discharge.  Neck: Normal range of motion. Neck supple. No JVD present. No thyromegaly present. There is left carotid bruit. There is a bruit in the left subclavian artery area. Cardiovascular: Normal rate, regular rhythm, normal heart sounds and. Exam reveals no gallop and no friction rub. There is 2/6 systolic ejection murmur at the aortic area.  Pulmonary/Chest: Effort normal and breath sounds normal. No stridor. No respiratory distress. He has no wheezes. He has no rales. He exhibits no tenderness.  Abdominal: Soft. Bowel sounds are normal. He exhibits no distension. There is no tenderness. There is no rebound and no guarding.  Musculoskeletal: Normal range of motion. He exhibits no edema and no tenderness.  Neurological: He is alert and oriented to person, place, and time. Coordination normal.  Skin: Skin is warm and dry. No rash noted. He is not diaphoretic.  No erythema. No pallor.  Psychiatric: He has a normal mood and affect. His behavior is normal. Judgment and thought content normal.   EKG: Normal sinus rhythm with T wave changes in the inferior leads.   ASSESSMENT AND PLAN

## 2013-04-11 NOTE — Assessment & Plan Note (Signed)
Blood pressure is reasonably controlled on current medications. 

## 2013-04-11 NOTE — Telephone Encounter (Signed)
Informed him that per Dr Kirke Corin he can have a glass of wine.

## 2013-04-11 NOTE — Assessment & Plan Note (Signed)
He has no symptoms of angina. Continue medical therapy. 

## 2013-04-11 NOTE — Patient Instructions (Signed)
Your physician recommends that you schedule a follow-up appointment in: 3 months. Your physician recommends that you continue on your current medications as directed. Please refer to the Current Medication list given to you today. 

## 2013-04-11 NOTE — Assessment & Plan Note (Signed)
His claudication improved significantly after bilateral iliac artery stenting. He still has significant SFA disease bilaterally which is being treated medically. He complains of an itchy rash on his back of unclear etiology. I asked him to followup with Dr. Patsy Lager regarding that. Although it could be due to Plavix, the patient had Plavix in the past with no reactions. One option would be to switch to aspirin but he had significant bruising on he was on aspirin.

## 2013-04-11 NOTE — Telephone Encounter (Signed)
Patient's wife called and wants to know if he can have a glass of wine with the Rx's he is taking? Please advise

## 2013-05-01 ENCOUNTER — Ambulatory Visit (INDEPENDENT_AMBULATORY_CARE_PROVIDER_SITE_OTHER): Payer: Medicare Other | Admitting: Family Medicine

## 2013-05-01 ENCOUNTER — Encounter: Payer: Self-pay | Admitting: Family Medicine

## 2013-05-01 VITALS — BP 120/80 | HR 77 | Temp 98.0°F | Ht 69.0 in | Wt 173.5 lb

## 2013-05-01 DIAGNOSIS — I700 Atherosclerosis of aorta: Secondary | ICD-10-CM

## 2013-05-01 DIAGNOSIS — N183 Chronic kidney disease, stage 3 unspecified: Secondary | ICD-10-CM | POA: Insufficient documentation

## 2013-05-01 DIAGNOSIS — R21 Rash and other nonspecific skin eruption: Secondary | ICD-10-CM

## 2013-05-01 HISTORY — DX: Atherosclerosis of aorta: I70.0

## 2013-05-01 MED ORDER — CLOPIDOGREL BISULFATE 75 MG PO TABS: 75.0000 mg | ORAL_TABLET | Freq: Every day | ORAL | Status: DC

## 2013-05-01 MED ORDER — PREDNISONE 20 MG PO TABS: ORAL_TABLET | ORAL | Status: DC

## 2013-05-01 MED ORDER — TRIAMCINOLONE ACETONIDE 0.1 % EX CREA: 1.0000 "application " | TOPICAL_CREAM | Freq: Two times a day (BID) | CUTANEOUS | Status: DC

## 2013-05-01 NOTE — Progress Notes (Signed)
Date:  05/01/2013   Name:  [name]   DOB:  1940-11-16   MRN:  [id number] Gender: male Age: 73 y.o.  Primary Physician:  Owens Loffler, MD   Chief Complaint: "Skin is on fire"   Subjective:   History of Present Illness:  [name] is a 73 y.o. pleasant patient who presents with the following:  Pleasant patient with a history of multiple medical problems including coronary disease, status post bypass grafting, severe peripheral arterial disease with recent stenting, history of intraventricular hemorrhage in March of 2014, and multiple other significant medical problems detailed below.  He is here today and basically is having a flat mildly pinkish macular rash with a very fine scale on his torso and arms. He has not had any known exposures that he can think of, including the soaks, topical exposures, or any type of new medications are over-the-counter medications.  The patient questions whether or not this could be coming from Plavix.  tching on back and really gotten a lot worse. And blood is at the surface.   Send Dr. Fletcher Anon a note about if going off plavix.   Patient Active Problem List   Diagnosis Date Noted  . Aortic calcification 05/01/2013  . CKD (chronic kidney disease) stage 3, GFR 30-59 ml/min 05/01/2013  . Bilateral renal artery stenosis   . Right leg DVT 08/13/2012  . HTN (hypertension) 07/15/2012  . Intraventricular hemorrhage 07/12/2012  . Peripheral arterial disease   . Ischemic cardiomyopathy 07/08/2011  . CAD (coronary artery disease), native coronary artery 07/07/2011  . Carotid bruit 04/25/2011  . Subclavian arterial stenosis 04/25/2011  . BPH (benign prostatic hyperplasia) 12/29/2010  . COPD (chronic obstructive pulmonary disease) 01/08/2010  . ABDOMINAL AORTIC ANEURYSM 11/06/2009  . HYPERCHOLESTEROLEMIA 10/09/2009  . TOBACCO ABUSE 04/11/2008  . PROSTATE SPECIFIC ANTIGEN, ELEVATED 04/11/2008  . HYPOTHYROIDISM NOS 09/07/2006    Past  Medical History  Diagnosis Date  . Venous insufficiency   . Peripheral arterial disease     a. Previous left lower extremity stenting by Dr. Jamal Collin;  b. 12/2012 s/p bilat ostial common iliac stenting.  . Tobacco abuse   . COPD (chronic obstructive pulmonary disease)   . Hypothyroidism   . Subclavian artery stenosis, left 05/2012    Status post stenting of the ostium and self-expanding stent placement to the left axillary artery  . Right leg DVT 08/13/2012  . Intraventricular hemorrhage 07/12/2012  . Hypertension   . CHF (congestive heart failure)   . Arthritis     "hands; not dx'd" (12/26/2012)  . Bilateral renal artery stenosis   . Aortic calcification 05/01/2013    Past Surgical History  Procedure Laterality Date  . Angioplasty / stenting femoral  05/2012  . Coronary artery bypass graft  07/11/2011    Procedure: CORONARY ARTERY BYPASS GRAFTING (CABG);  Surgeon: Grace Isaac, MD;  Location: [facility];  Service: Open Heart Surgery;  Laterality: N/A;  Times 3. On Pump. Using right greater saphenous vein and left internal mammary artery.   . Cardiac catheterization  2/14    [facility]  . Subclavian artery stent  05/2012    "2 stents" (12/26/2012)  . Vena cava filter placement  07/2012    Removable  . Angioplasty / stenting iliac Bilateral 12/26/2012  . Cholecystectomy  1990's    History   Social History  . Marital Status: Married    Spouse Name: N/A    Number of Children: 2  . Years of Education: N/A  Occupational History  . Retired     Land   Social History Main Topics  . Smoking status: Former Smoker -- 1.00 packs/day for 50 years    Types: Cigarettes    Quit date: 04/27/2011  . Smokeless tobacco: Never Used  . Alcohol Use: No  . Drug Use: No  . Sexual Activity: Not Currently   Other Topics Concern  . Not on file   Social History Narrative   Exercsiing 3 times a week.    Moderate diet control.    O living will, no HCPOA.    Family History  Problem Relation  Age of Onset  . Alcohol abuse Father   . Cirrhosis Father   . Hypothyroidism Sister     Allergies  Allergen Reactions  . Plavix [Clopidogrel Bisulfate]     Brain hemorrhage prev while on plavix and aspirin    Medication list has been reviewed and updated.  Review of Systems:   GEN: No acute illnesses, no fevers, chills. GI: No n/v/d, eating normally Pulm: No SOB Interactive and getting along well at home.  Otherwise, ROS is as per the HPI.  Objective:   Physical Examination: BP 120/80  Pulse 77  Temp(Src) 98 F (36.7 C) (Oral)  Ht 5\' 9"  (1.753 m)  Wt 173 lb 8 oz (78.699 kg)  BMI 25.61 kg/m2  Ideal Body Weight: Weight in (lb) to have BMI = 25: 168.9   GEN: WDWN, NAD, Non-toxic, A & O x 3 HEENT: Atraumatic, Normocephalic. Neck supple. No masses, No LAD. Ears and Nose: No external deformity. SKIN: multiple areas of excoriation on the arm and back, with at [facility]. Macular rash with slight scale on the posterior torso as well as the arms. None are apparent on the legs. EXTR: No c/c/e NEURO Normal gait.  PSYCH: Normally interactive. Conversant. Not depressed or anxious appearing.  Calm demeanor.   Laboratory and Imaging Data:  Assessment & Plan:    Rash and nonspecific skin eruption  Aortic calcification  CKD (chronic kidney disease) stage 3, GFR 30-59 ml/min  I am hesitant to say that his drug reaction at this point, and I would prefer to treat this as if it were typical skin rash or ALLERGIC dermatitis. I can have him do triamcinolone cream b.i.d. As well as a short dose of some oral prednisone.  I'm going to send a copy of my note to Dr. Fletcher Anon, if for some reason he does not clear up, it might be reasonable to try him on a different antiplatelet medication, but I am less familiar with some of the newer medications, and I would like to completely defer to his cardiologist in this regard. If he does not improve or worsens, we will be in contact.  New medications,  updates to list, dose adjustments: Meds ordered this encounter  Medications  . predniSONE (DELTASONE) 20 MG tablet    Sig: 2 tabs po for 4 days, then 1 po for 3 days    Dispense:  11 tablet    Refill:  0  . triamcinolone cream (KENALOG) 0.1 %    Sig: Apply 1 application topically 2 (two) times daily.    Dispense:  454 g    Refill:  1  . clopidogrel (PLAVIX) 75 MG tablet    Sig: Take 1 tablet (75 mg total) by mouth daily.    Dispense:  30 tablet    Refill:  6    Signed,  Redmond Whittley T. Jaelyn Bourgoin, MD, [place]  Therapist, music at [facility] [place] [address] Phone: [phone] Fax: [phone]    Medication List       This list is accurate as of: 05/01/13 11:59 PM.  Always use your most recent med list.               amLODipine 10 MG tablet  Commonly known as:  NORVASC  Take 1 tablet (10 mg total) by mouth daily.     atorvastatin 20 MG tablet  Commonly known as:  LIPITOR  Take 1 tablet (20 mg total) by mouth daily at 6 PM.     carvedilol 3.125 MG tablet  Commonly known as:  COREG  Take 1 tablet (3.125 mg total) by mouth 2 (two) times daily with a meal.     clopidogrel 75 MG tablet  Commonly known as:  PLAVIX  Take 1 tablet (75 mg total) by mouth daily.     levothyroxine 200 MCG tablet  Commonly known as:  SYNTHROID, LEVOTHROID  Take 1 tablet (200 mcg total) by mouth daily.     lisinopril 5 MG tablet  Commonly known as:  PRINIVIL,ZESTRIL  Take 1 tablet (5 mg total) by mouth daily.     predniSONE 20 MG tablet  Commonly known as:  DELTASONE  2 tabs po for 4 days, then 1 po for 3 days     triamcinolone cream 0.1 %  Commonly known as:  KENALOG  Apply 1 application topically 2 (two) times daily.

## 2013-05-01 NOTE — Progress Notes (Signed)
Pre-visit discussion using our clinic review tool. No additional management support is needed unless otherwise documented below in the visit note.  

## 2013-05-02 ENCOUNTER — Telehealth: Payer: Self-pay | Admitting: *Deleted

## 2013-05-02 MED ORDER — ASPIRIN EC 81 MG PO TBEC: 81.0000 mg | DELAYED_RELEASE_TABLET | Freq: Every day | ORAL | Status: DC

## 2013-05-02 NOTE — Telephone Encounter (Signed)
Informed patient that per Dr. Fletcher Anon "We can stop Plavix and start Aspirin 81 mg once daily. He reported significant bruising with Aspirin in the past. If that happens again, we can consider Effient or Brilinta". Patient verbalized understanding.

## 2013-05-22 ENCOUNTER — Telehealth: Payer: Self-pay | Admitting: Family Medicine

## 2013-05-22 ENCOUNTER — Ambulatory Visit (INDEPENDENT_AMBULATORY_CARE_PROVIDER_SITE_OTHER): Payer: Medicare Other | Admitting: Family Medicine

## 2013-05-22 ENCOUNTER — Encounter: Payer: Self-pay | Admitting: Family Medicine

## 2013-05-22 VITALS — BP 118/76 | HR 106 | Temp 99.0°F | Wt 173.8 lb

## 2013-05-22 DIAGNOSIS — J111 Influenza due to unidentified influenza virus with other respiratory manifestations: Secondary | ICD-10-CM | POA: Diagnosis not present

## 2013-05-22 MED ORDER — OSELTAMIVIR PHOSPHATE 75 MG PO CAPS: 75.0000 mg | ORAL_CAPSULE | Freq: Two times a day (BID) | ORAL | Status: DC

## 2013-05-22 NOTE — Telephone Encounter (Signed)
[facility] Triage Call Report Triage Record Num: [id number] Operator: Soledad Gerlach Patient Name: [name] Call Date & Time: 05/21/2013 4:52:05PM Patient Phone: [phone] PCP: Owens Loffler Patient Gender: Male PCP Fax : [phone] Patient DOB: 26-Jul-1940 Practice Name: [facility] Reason for Call: Caller: Nancy/Spouse; PCP: Owens Loffler (Family Practice); CB#: [phone]; Call regarding Influenza; onset of cough, fever 101.2 O, and fatigue with body aches and headache 05/21/13. Due to age > 93, advised appt within 24 hours as office policy is to examine patients before Rx for Tamiflu. Patient declines UC. Appt scheduled 05/22/13 0800 with Dr. Danise Mina. krs/can Protocol(s) Used: Flu-Like Symptoms Recommended Outcome per Protocol: Call Provider within 8 Hours Reason for Outcome: New OR worsening flu-like illness AND in high risk group for complications Care Advice: ~ Use a cool mist humidifier to moisten air. Be sure to clean according to manufacturer's instructions. ~ Rest until temperature returns to normal. Aspirin should not be used in anyone, 72 years of age and under, for temperature control, pain, or aches when flu is a possibility. ~ ~ SYMPTOM / CONDITION MANAGEMENT ~ INFECTION CONTROL ~ Follow public health advisories. Analgesic/Antipyretic Advice - NSAIDs: Consider aspirin, ibuprofen, naproxen or ketoprofen for pain or fever as directed on label or by pharmacist/provider. PRECAUTIONS: - If over 6 years of age, should not take longer than 1 week without consulting provider. EXCEPTIONS: - Should not be used if taking blood thinners or have bleeding problems. - Do not use if have history of sensitivity/allergy to any of these medications; or history of cardiovascular, ulcer, kidney, liver disease or diabetes unless approved by provider. - Do not exceed recommended dose or frequency. ~ Analgesic/Antipyretic Advice - Acetaminophen: Consider  acetaminophen as directed on label or by pharmacist/provider for pain or fever PRECAUTIONS: - Use if there is no history of liver disease, alcoholism, or intake of three or more alcohol drinks per day - Only if approved by provider during pregnancy or when breastfeeding - During pregnancy, acetaminophen should not be taken more than 3 consecutive days without telling provider - If pregnant, do not take any other analgesic/antipyretic unless approved by your provider - Do not exceed recommended dose or frequency. ~ See a provider immediately or go to the Emergency Department if having chest pain with breathing, change in level of consciousness, new seizure, vomiting and unable to keep fluids down for 8 hours or more, or has not urinated for 8 or more hours. ~ Influenza - Expected Course: - Symptoms start to improve in 3 to 7 days. - Cough and feeling tired (malaise) may continue for several weeks. - Cough and extreme fatigue lasting more than 3 weeks needs medical evaluation. - Remain at home at least 24 hours after being free of fever 100F (37.8C) without the use of fever reducing ~ 05/21/2013 5:01:58PM Page 1 of 2 [id number] [facility] Triage Call Report Patient Name: [name] continuation page/s medication. Influenza Respiratory Hygiene: - Cover the nose/mouth tightly with a tissue when coughing or sneezing. - Use tissue one time and discard in the nearest waste receptacle. - Wash hands with soap and water or alcohol-based hand rub for at least 15 seconds after coming into contact with respiratory secretions and contaminated objects/materials. - When a tissue is not available, cough into the bend of the elbow. - Avoid touching your eyes, nose or mouth. - When ill wear a disposable face mask when around others, especially around those at high risk for flu-related complications, to help decrease the  likelihood of infecting them. ~ Do not go to work, school, or any  community activity until you have not had a fever (100F or 37.8C) for at least 24 hours without taking fever-reducing medication. This means staying at home for at least 3 to 5, possibly 7 days. ~ ~ If having flu-like symptoms, postpone any travel, especially international travel. You may be quarantined. To help prevent a widespread community outbreak of the flu, call the call center, your clinic or provider's office to discuss any questions or concerns before going in unless you have severe respiratory or any nervous system symptoms. ~ ~ If alone, have someone check in with you several times a day while you are feeling ill. Influenza - Caring for a Sick Person - If possible, have one person (not at high-risk for the flu) be the caregiver; limit close contact with the ill person; consider wearing a disposable face mask when close contact is necessary. - The sick person should be in a room separate from common areas, e.g. spare bedroom with a bath. Keep door to room closed. Surface areas in the room should be wiped with disinfectant daily. - Remind the ill person to cover coughs and sneezes, to dispose of tissues after one use, and to wash their hands often. A face mask should be worn when in common areas and when leaving for medical care. - Do not allow visitors; encourage visiting by telephone or e-mail. - Do not share dishes or eating utensils; wash in dishwasher. The ill person's clothes, bed linens and towels should be washed with laundry soap and dried on the hot setting. - Call your provider to ask if antiviral medication is appropriate for the ill person and others in the home. ~ Influenza - Face Masks Disposable face masks should be used by: - People with flu-like symptoms when in close contact with others in the home, if breastfeeding a baby, or when leaving the home for medical care - People at high risk for flu-related complications * Disposable face masks are available at  pharmacies and hardware or building supply stores * Used face masks can be thrown away in the regular trash. Wash hands with soap and water or with alcohol-based gel after removing a face mask. ~ Antiviral medications - Prescribed medications are available in pills, liquids or inhaled powder that help prevent or lessen symptoms of viral illnesses. Antivirals are usually given to persons at a higher risk for flu-related complications or who are very ill. Most healthy people do not need to be treated with antiviral drugs. - Recommended medications for this season: oseltamivir (Tamiflu) and zanamivir (Relenza). - Work best when started within the first two days of symptoms. - Speak with your provider as soon as possible if exposed to the flu or if you have new symptoms of the flu. ~ Total water intake includes drinking water, water in beverages, and water contained in food. Fluids make up about 80% of the body's total hydration need. Individual fluid requirement to maintain hydration vary based on physical activity, environmental factors and illness. Limit fluids that contain sugar, caffeine, or alcohol. Urine will be very light yellow color when you drink enough fluids. ~ 05/21/2013 5:01:58PM Page 2 of 2 [email or web address]

## 2013-05-22 NOTE — Assessment & Plan Note (Signed)
Clinical dx.  Given comorbidities, treat with tamiflu. Red flags to return discussed. Lungs clear today. Encouraged flu shot in future.

## 2013-05-22 NOTE — Patient Instructions (Signed)
Take tamiflu as I think you have the flu. Watch for worsening productive cough or worsening shortness of breath - if this happens let us know.  Influenza, Adult Influenza ("the flu") is a viral infection of the respiratory tract. It occurs more often in winter months because people spend more time in close contact with one another. Influenza can make you feel very sick. Influenza easily spreads from person to person (contagious). CAUSES  Influenza is caused by a virus that infects the respiratory tract. You can catch the virus by breathing in droplets from an infected person's cough or sneeze. You can also catch the virus by touching something that was recently contaminated with the virus and then touching your mouth, nose, or eyes. SYMPTOMS  Symptoms typically last 4 to 10 days and may include:  Fever.  Chills.  Headache, body aches, and muscle aches.  Sore throat.  Chest discomfort and cough.  Poor appetite.  Weakness or feeling tired.  Dizziness.  Nausea or vomiting. DIAGNOSIS  Diagnosis of influenza is often made based on your history and a physical exam. A nose or throat swab test can be done to confirm the diagnosis. RISKS AND COMPLICATIONS You may be at risk for a more severe case of influenza if you smoke cigarettes, have diabetes, have chronic heart disease (such as heart failure) or lung disease (such as asthma), or if you have a weakened immune system. Elderly people and pregnant women are also at risk for more serious infections. The most common complication of influenza is a lung infection (pneumonia). Sometimes, this complication can require emergency medical care and may be life-threatening. PREVENTION  An annual influenza vaccination (flu shot) is the best way to avoid getting influenza. An annual flu shot is now routinely recommended for all adults in the [place] TREATMENT  In mild cases, influenza goes away on its own. Treatment is directed at relieving symptoms. For  more severe cases, your caregiver may prescribe antiviral medicines to shorten the sickness. Antibiotic medicines are not effective, because the infection is caused by a virus, not by bacteria. HOME CARE INSTRUCTIONS  Only take over-the-counter or prescription medicines for pain, discomfort, or fever as directed by your caregiver.  Use a cool mist humidifier to make breathing easier.  Get plenty of rest until your temperature returns to normal. This usually takes 3 to 4 days.  Drink enough fluids to keep your urine clear or pale yellow.  Cover your mouth and nose when coughing or sneezing, and wash your hands well to avoid spreading the virus.  Stay home from work or school until your fever has been gone for at least 1 full day. SEEK MEDICAL CARE IF:   You have chest pain or a deep cough that worsens or produces more mucus.  You have nausea, vomiting, or diarrhea. SEEK IMMEDIATE MEDICAL CARE IF:   You have difficulty breathing, shortness of breath, or your skin or nails turn bluish.  You have severe neck pain or stiffness.  You have a severe headache, facial pain, or earache.  You have a worsening or recurring fever.  You have nausea or vomiting that cannot be controlled. MAKE SURE YOU:  Understand these instructions.  Will watch your condition.  Will get help right away if you are not doing well or get worse. Document Released: 04/08/2000 Document Revised: 10/11/2011 Document Reviewed: 07/11/2011 [facility] Patient Information 2014 [place], [place].

## 2013-05-22 NOTE — Progress Notes (Signed)
   Subjective:    Patient ID: [name], male    DOB: 01-Dec-1940, 73 y.o.   MRN: [id number]  HPI CC: flu?  2d h/o feeling ill.  Sudden onset.  Fever to 101 yesterday.  Congestion, headache, body aches, sore throat, fatigue, more dyspneic than normal.  Trouble sleeping at night time.  Mild dry cough.  No ear or tooth pain, no abd pain, or nausea. So far has tried tylenol for pain. No smokers at home.   No sick contacts that he knows but was recently involved in swap meet. Did not receive flu shot this year.  "touch of COPD".  Ex smoker quit 2013.  H/o CAD s/p CABG  Past Medical History  Diagnosis Date  . Venous insufficiency   . Peripheral arterial disease     a. Previous left lower extremity stenting by Dr. Jamal Collin;  b. 12/2012 s/p bilat ostial common iliac stenting.  . Tobacco abuse   . COPD (chronic obstructive pulmonary disease)   . Hypothyroidism   . Subclavian artery stenosis, left 05/2012    Status post stenting of the ostium and self-expanding stent placement to the left axillary artery  . Right leg DVT 08/13/2012  . Intraventricular hemorrhage 07/12/2012  . Hypertension   . CHF (congestive heart failure)   . Arthritis     "hands; not dx'd" (12/26/2012)  . Bilateral renal artery stenosis   . Aortic calcification 05/01/2013    Past Surgical History  Procedure Laterality Date  . Angioplasty / stenting femoral  05/2012  . Coronary artery bypass graft  07/11/2011    Procedure: CORONARY ARTERY BYPASS GRAFTING (CABG);  Surgeon: Grace Isaac, MD;  Location: [facility];  Service: Open Heart Surgery;  Laterality: N/A;  Times 3. On Pump. Using right greater saphenous vein and left internal mammary artery.   . Cardiac catheterization  2/14    [facility]  . Subclavian artery stent  05/2012    "2 stents" (12/26/2012)  . Vena cava filter placement  07/2012    Removable  . Angioplasty / stenting iliac Bilateral 12/26/2012  . Cholecystectomy  1990's    Review of Systems Per HPI    Objective:     Physical Exam  Nursing note and vitals reviewed. Constitutional: He appears well-developed and well-nourished. No distress.  HENT:  Head: Normocephalic and atraumatic.  Right Ear: Hearing, tympanic membrane, external ear and ear canal normal.  Left Ear: Hearing, tympanic membrane, external ear and ear canal normal.  Nose: Nose normal. No mucosal edema or rhinorrhea. Right sinus exhibits no maxillary sinus tenderness and no frontal sinus tenderness. Left sinus exhibits no maxillary sinus tenderness and no frontal sinus tenderness.  Mouth/Throat: Uvula is midline and mucous membranes are normal. Posterior oropharyngeal erythema (mild) present. No oropharyngeal exudate, posterior oropharyngeal edema or tonsillar abscesses.  Eyes: Conjunctivae and EOM are normal. Pupils are equal, round, and reactive to light. No scleral icterus.  Neck: Normal range of motion. Neck supple.  Cardiovascular: Normal rate, regular rhythm, normal heart sounds and intact distal pulses.   No murmur heard. Pulmonary/Chest: Effort normal and breath sounds normal. No respiratory distress. He has no wheezes. He has no rales.  clear  Lymphadenopathy:    He has no cervical adenopathy.  Skin: Skin is warm and dry. No rash noted.       Assessment & Plan:

## 2013-05-22 NOTE — Progress Notes (Signed)
Pre-visit discussion using our clinic review tool. No additional management support is needed unless otherwise documented below in the visit note.  

## 2013-05-24 ENCOUNTER — Inpatient Hospital Stay (HOSPITAL_COMMUNITY): Payer: Medicare Other

## 2013-05-24 ENCOUNTER — Telehealth: Payer: Self-pay | Admitting: Family Medicine

## 2013-05-24 ENCOUNTER — Encounter (HOSPITAL_COMMUNITY): Payer: Self-pay | Admitting: Emergency Medicine

## 2013-05-24 ENCOUNTER — Inpatient Hospital Stay (HOSPITAL_COMMUNITY)
Admission: EM | Admit: 2013-05-24 | Discharge: 2013-05-29 | DRG: 291 | Disposition: A | Discharge status: Home Health | Payer: Medicare Other | Attending: Family Medicine | Admitting: Family Medicine

## 2013-05-24 ENCOUNTER — Emergency Department (HOSPITAL_COMMUNITY): Payer: Medicare Other

## 2013-05-24 DIAGNOSIS — I872 Venous insufficiency (chronic) (peripheral): Secondary | ICD-10-CM | POA: Diagnosis present

## 2013-05-24 DIAGNOSIS — N183 Chronic kidney disease, stage 3 unspecified: Secondary | ICD-10-CM

## 2013-05-24 DIAGNOSIS — M129 Arthropathy, unspecified: Secondary | ICD-10-CM | POA: Diagnosis present

## 2013-05-24 DIAGNOSIS — Z951 Presence of aortocoronary bypass graft: Secondary | ICD-10-CM | POA: Diagnosis present

## 2013-05-24 DIAGNOSIS — E039 Hypothyroidism, unspecified: Secondary | ICD-10-CM | POA: Diagnosis present

## 2013-05-24 DIAGNOSIS — J9600 Acute respiratory failure, unspecified whether with hypoxia or hypercapnia: Secondary | ICD-10-CM

## 2013-05-24 DIAGNOSIS — J4489 Other specified chronic obstructive pulmonary disease: Secondary | ICD-10-CM | POA: Diagnosis not present

## 2013-05-24 DIAGNOSIS — N289 Disorder of kidney and ureter, unspecified: Secondary | ICD-10-CM

## 2013-05-24 DIAGNOSIS — I739 Peripheral vascular disease, unspecified: Secondary | ICD-10-CM

## 2013-05-24 DIAGNOSIS — Z8673 Personal history of transient ischemic attack (TIA), and cerebral infarction without residual deficits: Secondary | ICD-10-CM | POA: Diagnosis not present

## 2013-05-24 DIAGNOSIS — R6510 Systemic inflammatory response syndrome (SIRS) of non-infectious origin without acute organ dysfunction: Secondary | ICD-10-CM | POA: Diagnosis not present

## 2013-05-24 DIAGNOSIS — I82401 Acute embolism and thrombosis of unspecified deep veins of right lower extremity: Secondary | ICD-10-CM

## 2013-05-24 DIAGNOSIS — I5033 Acute on chronic diastolic (congestive) heart failure: Principal | ICD-10-CM | POA: Diagnosis present

## 2013-05-24 DIAGNOSIS — I4891 Unspecified atrial fibrillation: Secondary | ICD-10-CM

## 2013-05-24 DIAGNOSIS — N179 Acute kidney failure, unspecified: Secondary | ICD-10-CM

## 2013-05-24 DIAGNOSIS — I480 Paroxysmal atrial fibrillation: Secondary | ICD-10-CM

## 2013-05-24 DIAGNOSIS — J189 Pneumonia, unspecified organism: Secondary | ICD-10-CM

## 2013-05-24 DIAGNOSIS — Z87891 Personal history of nicotine dependence: Secondary | ICD-10-CM | POA: Diagnosis present

## 2013-05-24 DIAGNOSIS — N400 Enlarged prostate without lower urinary tract symptoms: Secondary | ICD-10-CM

## 2013-05-24 DIAGNOSIS — Z7982 Long term (current) use of aspirin: Secondary | ICD-10-CM

## 2013-05-24 DIAGNOSIS — I359 Nonrheumatic aortic valve disorder, unspecified: Secondary | ICD-10-CM | POA: Diagnosis present

## 2013-05-24 DIAGNOSIS — I498 Other specified cardiac arrhythmias: Secondary | ICD-10-CM | POA: Diagnosis present

## 2013-05-24 DIAGNOSIS — I2510 Atherosclerotic heart disease of native coronary artery without angina pectoris: Secondary | ICD-10-CM

## 2013-05-24 DIAGNOSIS — I255 Ischemic cardiomyopathy: Secondary | ICD-10-CM

## 2013-05-24 DIAGNOSIS — I10 Essential (primary) hypertension: Secondary | ICD-10-CM

## 2013-05-24 DIAGNOSIS — I700 Atherosclerosis of aorta: Secondary | ICD-10-CM

## 2013-05-24 DIAGNOSIS — I959 Hypotension, unspecified: Secondary | ICD-10-CM | POA: Diagnosis present

## 2013-05-24 DIAGNOSIS — J449 Chronic obstructive pulmonary disease, unspecified: Secondary | ICD-10-CM

## 2013-05-24 DIAGNOSIS — E7800 Pure hypercholesterolemia, unspecified: Secondary | ICD-10-CM

## 2013-05-24 DIAGNOSIS — I615 Nontraumatic intracerebral hemorrhage, intraventricular: Secondary | ICD-10-CM

## 2013-05-24 DIAGNOSIS — R059 Cough, unspecified: Secondary | ICD-10-CM | POA: Diagnosis not present

## 2013-05-24 DIAGNOSIS — Z79899 Other long term (current) drug therapy: Secondary | ICD-10-CM

## 2013-05-24 DIAGNOSIS — E785 Hyperlipidemia, unspecified: Secondary | ICD-10-CM | POA: Diagnosis present

## 2013-05-24 DIAGNOSIS — I771 Stricture of artery: Secondary | ICD-10-CM

## 2013-05-24 DIAGNOSIS — Z86718 Personal history of other venous thrombosis and embolism: Secondary | ICD-10-CM | POA: Diagnosis not present

## 2013-05-24 DIAGNOSIS — I129 Hypertensive chronic kidney disease with stage 1 through stage 4 chronic kidney disease, or unspecified chronic kidney disease: Secondary | ICD-10-CM | POA: Diagnosis present

## 2013-05-24 DIAGNOSIS — R972 Elevated prostate specific antigen [PSA]: Secondary | ICD-10-CM

## 2013-05-24 DIAGNOSIS — R05 Cough: Secondary | ICD-10-CM | POA: Diagnosis not present

## 2013-05-24 DIAGNOSIS — I509 Heart failure, unspecified: Secondary | ICD-10-CM | POA: Diagnosis present

## 2013-05-24 DIAGNOSIS — I701 Atherosclerosis of renal artery: Secondary | ICD-10-CM | POA: Diagnosis present

## 2013-05-24 DIAGNOSIS — J90 Pleural effusion, not elsewhere classified: Secondary | ICD-10-CM | POA: Diagnosis not present

## 2013-05-24 DIAGNOSIS — I714 Abdominal aortic aneurysm, without rupture, unspecified: Secondary | ICD-10-CM

## 2013-05-24 DIAGNOSIS — J111 Influenza due to unidentified influenza virus with other respiratory manifestations: Secondary | ICD-10-CM

## 2013-05-24 DIAGNOSIS — N281 Cyst of kidney, acquired: Secondary | ICD-10-CM | POA: Diagnosis not present

## 2013-05-24 DIAGNOSIS — R0602 Shortness of breath: Secondary | ICD-10-CM | POA: Diagnosis not present

## 2013-05-24 DIAGNOSIS — R0989 Other specified symptoms and signs involving the circulatory and respiratory systems: Secondary | ICD-10-CM

## 2013-05-24 DIAGNOSIS — R918 Other nonspecific abnormal finding of lung field: Secondary | ICD-10-CM | POA: Diagnosis not present

## 2013-05-24 DIAGNOSIS — F17200 Nicotine dependence, unspecified, uncomplicated: Secondary | ICD-10-CM

## 2013-05-24 DIAGNOSIS — I2589 Other forms of chronic ischemic heart disease: Secondary | ICD-10-CM | POA: Diagnosis present

## 2013-05-24 LAB — POCT I-STAT, CHEM 8
BUN: 37 mg/dL — ABNORMAL HIGH (ref 6–23)
CALCIUM ION: 1.13 mmol/L (ref 1.13–1.30)
Chloride: 104 mEq/L (ref 96–112)
Creatinine, Ser: 2.3 mg/dL — ABNORMAL HIGH (ref 0.50–1.35)
Glucose, Bld: 113 mg/dL — ABNORMAL HIGH (ref 70–99)
HEMATOCRIT: 47 % (ref 39.0–52.0)
Hemoglobin: 16 g/dL (ref 13.0–17.0)
Potassium: 4.5 mEq/L (ref 3.7–5.3)
Sodium: 136 mEq/L — ABNORMAL LOW (ref 137–147)
TCO2: 21 mmol/L (ref 0–100)

## 2013-05-24 LAB — POCT I-STAT 3, ART BLOOD GAS (G3+)
ACID-BASE DEFICIT: 5 mmol/L — AB (ref 0.0–2.0)
Bicarbonate: 18.5 mEq/L — ABNORMAL LOW (ref 20.0–24.0)
O2 Saturation: 99 %
TCO2: 19 mmol/L (ref 0–100)
pCO2 arterial: 30.6 mmHg — ABNORMAL LOW (ref 35.0–45.0)
pH, Arterial: 7.39 (ref 7.350–7.450)
pO2, Arterial: 135 mmHg — ABNORMAL HIGH (ref 80.0–100.0)

## 2013-05-24 LAB — COMPREHENSIVE METABOLIC PANEL
ALBUMIN: 3.3 g/dL — AB (ref 3.5–5.2)
ALT: 23 U/L (ref 0–53)
ALT: 22 U/L (ref 0–53)
AST: 36 U/L (ref 0–37)
AST: 34 U/L (ref 0–37)
Albumin: 2.9 g/dL — ABNORMAL LOW (ref 3.5–5.2)
Alkaline Phosphatase: 80 U/L (ref 39–117)
Alkaline Phosphatase: 80 U/L (ref 39–117)
BILIRUBIN TOTAL: 0.6 mg/dL (ref 0.3–1.2)
BUN: 37 mg/dL — AB (ref 6–23)
BUN: 40 mg/dL — AB (ref 6–23)
CALCIUM: 9.4 mg/dL (ref 8.4–10.5)
CALCIUM: 8.7 mg/dL (ref 8.4–10.5)
CHLORIDE: 100 meq/L (ref 96–112)
CO2: 19 mEq/L (ref 19–32)
CO2: 21 mEq/L (ref 19–32)
CREATININE: 2.32 mg/dL — AB (ref 0.50–1.35)
CREATININE: 2.3 mg/dL — AB (ref 0.50–1.35)
Chloride: 97 mEq/L (ref 96–112)
GFR calc Af Amer: 31 mL/min — ABNORMAL LOW (ref >90)
GFR calc non Af Amer: 27 mL/min — ABNORMAL LOW (ref >90)
GFR calc non Af Amer: 27 mL/min — ABNORMAL LOW (ref >90)
GFR, EST AFRICAN AMERICAN: 31 mL/min — AB (ref >90)
Glucose, Bld: 114 mg/dL — ABNORMAL HIGH (ref 70–99)
Glucose, Bld: 131 mg/dL — ABNORMAL HIGH (ref 70–99)
POTASSIUM: 5 meq/L (ref 3.7–5.3)
Potassium: 4.5 mEq/L (ref 3.7–5.3)
Sodium: 135 mEq/L — ABNORMAL LOW (ref 137–147)
Sodium: 139 mEq/L (ref 137–147)
TOTAL PROTEIN: 7.6 g/dL (ref 6.0–8.3)
Total Bilirubin: 0.9 mg/dL (ref 0.3–1.2)
Total Protein: 7.1 g/dL (ref 6.0–8.3)

## 2013-05-24 LAB — PROTIME-INR
INR: 1.43 (ref 0.00–1.49)
PROTHROMBIN TIME: 17.1 s — AB (ref 11.6–15.2)

## 2013-05-24 LAB — CBC WITH DIFFERENTIAL/PLATELET
Basophils Absolute: 0 10*3/uL (ref 0.0–0.1)
Basophils Relative: 0 % (ref 0–1)
EOS PCT: 0 % (ref 0–5)
Eosinophils Absolute: 0 10*3/uL (ref 0.0–0.7)
HEMATOCRIT: 46.9 % (ref 39.0–52.0)
HEMOGLOBIN: 15.8 g/dL (ref 13.0–17.0)
LYMPHS ABS: 2 10*3/uL (ref 0.7–4.0)
LYMPHS PCT: 21 % (ref 12–46)
MCH: 30.1 pg (ref 26.0–34.0)
MCHC: 33.7 g/dL (ref 30.0–36.0)
MCV: 89.3 fL (ref 78.0–100.0)
MONOS PCT: 15 % — AB (ref 3–12)
Monocytes Absolute: 1.5 10*3/uL — ABNORMAL HIGH (ref 0.1–1.0)
NEUTROS ABS: 6.2 10*3/uL (ref 1.7–7.7)
Neutrophils Relative %: 64 % (ref 43–77)
Platelets: 213 10*3/uL (ref 150–400)
RBC: 5.25 MIL/uL (ref 4.22–5.81)
RDW: 14 % (ref 11.5–15.5)
WBC: 9.7 10*3/uL (ref 4.0–10.5)

## 2013-05-24 LAB — POCT I-STAT TROPONIN I: TROPONIN I, POC: 0.03 ng/mL (ref 0.00–0.08)

## 2013-05-24 LAB — TROPONIN I: Troponin I: <0.3 ng/mL (ref <0.30)

## 2013-05-24 LAB — URINALYSIS, ROUTINE W REFLEX MICROSCOPIC
BILIRUBIN URINE: NEGATIVE
GLUCOSE, UA: NEGATIVE mg/dL
Hgb urine dipstick: NEGATIVE
KETONES UR: NEGATIVE mg/dL
LEUKOCYTES UA: NEGATIVE
Nitrite: NEGATIVE
PROTEIN: 30 mg/dL — AB
Specific Gravity, Urine: 1.017 (ref 1.005–1.030)
Urobilinogen, UA: 1 mg/dL (ref 0.0–1.0)
pH: 5 (ref 5.0–8.0)

## 2013-05-24 LAB — URINE MICROSCOPIC-ADD ON

## 2013-05-24 LAB — PROCALCITONIN: Procalcitonin: 4.03 ng/mL

## 2013-05-24 LAB — MAGNESIUM: MAGNESIUM: 2.1 mg/dL (ref 1.5–2.5)

## 2013-05-24 LAB — SODIUM, URINE, RANDOM: SODIUM UR: 55 meq/L

## 2013-05-24 LAB — CREATININE, URINE, RANDOM: CREATININE, URINE: 142.03 mg/dL

## 2013-05-24 LAB — CG4 I-STAT (LACTIC ACID)
LACTIC ACID, VENOUS: 2.26 mmol/L — AB (ref 0.5–2.2)
Lactic Acid, Venous: 1.95 mmol/L (ref 0.5–2.2)

## 2013-05-24 LAB — PRO B NATRIURETIC PEPTIDE: PRO B NATRI PEPTIDE: 1493 pg/mL — AB (ref 0–125)

## 2013-05-24 LAB — APTT: APTT: 39 s — AB (ref 24–37)

## 2013-05-24 LAB — MRSA PCR SCREENING: MRSA by PCR: NEGATIVE

## 2013-05-24 LAB — STREP PNEUMONIAE URINARY ANTIGEN: Strep Pneumo Urinary Antigen: NEGATIVE

## 2013-05-24 MED ORDER — LEVOTHYROXINE SODIUM 200 MCG PO TABS
200.0000 ug | ORAL_TABLET | Freq: Every day | ORAL | Status: DC
  Administered 2013-05-25 – 2013-05-29 (×5): 200 ug via ORAL
  Filled 2013-05-24 (×6): qty 1

## 2013-05-24 MED ORDER — IPRATROPIUM BROMIDE 0.02 % IN SOLN
0.5000 mg | Freq: Four times a day (QID) | RESPIRATORY_TRACT | Status: DC
  Administered 2013-05-24 – 2013-05-26 (×8): 0.5 mg via RESPIRATORY_TRACT
  Filled 2013-05-24 (×8): qty 2.5

## 2013-05-24 MED ORDER — PANTOPRAZOLE SODIUM 40 MG PO TBEC
40.0000 mg | DELAYED_RELEASE_TABLET | Freq: Every day | ORAL | Status: DC
  Administered 2013-05-25: 40 mg via ORAL
  Filled 2013-05-24 (×2): qty 1

## 2013-05-24 MED ORDER — DEXTROSE 5 % IV SOLN
500.0000 mg | INTRAVENOUS | Status: DC
  Filled 2013-05-24: qty 500

## 2013-05-24 MED ORDER — CARVEDILOL 3.125 MG PO TABS
3.1250 mg | ORAL_TABLET | Freq: Two times a day (BID) | ORAL | Status: DC
  Administered 2013-05-25 – 2013-05-29 (×9): 3.125 mg via ORAL
  Filled 2013-05-24 (×12): qty 1

## 2013-05-24 MED ORDER — DEXTROSE 5 % IV SOLN: 1.0000 g | INTRAVENOUS | Status: DC

## 2013-05-24 MED ORDER — OSELTAMIVIR PHOSPHATE 30 MG PO CAPS
30.0000 mg | ORAL_CAPSULE | Freq: Every day | ORAL | Status: DC
  Filled 2013-05-24: qty 1

## 2013-05-24 MED ORDER — AMIODARONE HCL IN DEXTROSE 360-4.14 MG/200ML-% IV SOLN
60.0000 mg/h | INTRAVENOUS | Status: AC
  Administered 2013-05-24: 60 mg/h via INTRAVENOUS
  Filled 2013-05-24 (×3): qty 200

## 2013-05-24 MED ORDER — GUAIFENESIN ER 600 MG PO TB12
1200.0000 mg | ORAL_TABLET | Freq: Two times a day (BID) | ORAL | Status: DC
  Administered 2013-05-24 – 2013-05-29 (×10): 1200 mg via ORAL
  Filled 2013-05-24 (×11): qty 2

## 2013-05-24 MED ORDER — ASPIRIN EC 81 MG PO TBEC
81.0000 mg | DELAYED_RELEASE_TABLET | Freq: Every day | ORAL | Status: DC
  Administered 2013-05-25 – 2013-05-29 (×5): 81 mg via ORAL
  Filled 2013-05-24 (×5): qty 1

## 2013-05-24 MED ORDER — IPRATROPIUM BROMIDE 0.02 % IN SOLN: 0.5000 mg | RESPIRATORY_TRACT | Status: DC | PRN

## 2013-05-24 MED ORDER — OSELTAMIVIR PHOSPHATE 30 MG PO CAPS
30.0000 mg | ORAL_CAPSULE | Freq: Two times a day (BID) | ORAL | Status: DC
  Filled 2013-05-24 (×7): qty 1

## 2013-05-24 MED ORDER — LEVALBUTEROL HCL 0.63 MG/3ML IN NEBU
0.6300 mg | INHALATION_SOLUTION | Freq: Four times a day (QID) | RESPIRATORY_TRACT | Status: DC
  Administered 2013-05-24 – 2013-05-26 (×8): 0.63 mg via RESPIRATORY_TRACT
  Filled 2013-05-24 (×16): qty 3

## 2013-05-24 MED ORDER — AMIODARONE LOAD VIA INFUSION
150.0000 mg | Freq: Once | INTRAVENOUS | Status: AC
  Administered 2013-05-24: 150 mg via INTRAVENOUS
  Filled 2013-05-24: qty 83.34

## 2013-05-24 MED ORDER — DEXTROSE 5 % IV SOLN
500.0000 mg | Freq: Once | INTRAVENOUS | Status: AC
  Administered 2013-05-24: 500 mg via INTRAVENOUS

## 2013-05-24 MED ORDER — SODIUM CHLORIDE 0.9 % IV BOLUS (SEPSIS)
500.0000 mL | Freq: Once | INTRAVENOUS | Status: AC
  Administered 2013-05-24: 500 mL via INTRAVENOUS

## 2013-05-24 MED ORDER — LEVALBUTEROL HCL 0.63 MG/3ML IN NEBU
0.6300 mg | INHALATION_SOLUTION | Freq: Once | RESPIRATORY_TRACT | Status: AC
  Administered 2013-05-24: 0.63 mg via RESPIRATORY_TRACT
  Filled 2013-05-24: qty 3

## 2013-05-24 MED ORDER — OSELTAMIVIR PHOSPHATE 75 MG PO CAPS: 75.0000 mg | ORAL_CAPSULE | Freq: Every day | ORAL | Status: DC

## 2013-05-24 MED ORDER — DEXTROSE 5 % IV SOLN
1.0000 g | INTRAVENOUS | Status: DC
  Administered 2013-05-25 – 2013-05-26 (×2): 1 g via INTRAVENOUS
  Filled 2013-05-24 (×4): qty 10

## 2013-05-24 MED ORDER — LEVALBUTEROL HCL 0.63 MG/3ML IN NEBU: 0.6300 mg | INHALATION_SOLUTION | RESPIRATORY_TRACT | Status: DC | PRN

## 2013-05-24 MED ORDER — ATORVASTATIN CALCIUM 20 MG PO TABS
20.0000 mg | ORAL_TABLET | Freq: Every day | ORAL | Status: DC
  Administered 2013-05-24 – 2013-05-29 (×6): 20 mg via ORAL
  Filled 2013-05-24 (×6): qty 1

## 2013-05-24 MED ORDER — SODIUM CHLORIDE 0.9 % IV SOLN
INTRAVENOUS | Status: AC
  Administered 2013-05-24: 125 mL/h via INTRAVENOUS

## 2013-05-24 MED ORDER — DEXTROSE 5 % IV SOLN
1.0000 g | Freq: Once | INTRAVENOUS | Status: AC
  Administered 2013-05-24: 1 g via INTRAVENOUS
  Filled 2013-05-24: qty 10

## 2013-05-24 MED ORDER — SODIUM CHLORIDE 0.9 % IV SOLN
INTRAVENOUS | Status: DC
  Administered 2013-05-25 (×2): via INTRAVENOUS

## 2013-05-24 MED ORDER — AMIODARONE HCL IN DEXTROSE 360-4.14 MG/200ML-% IV SOLN
30.0000 mg/h | INTRAVENOUS | Status: DC
  Administered 2013-05-25: 30 mg/h via INTRAVENOUS
  Filled 2013-05-24 (×3): qty 200

## 2013-05-24 MED ORDER — FUROSEMIDE 10 MG/ML IJ SOLN
40.0000 mg | Freq: Once | INTRAMUSCULAR | Status: AC
  Administered 2013-05-24: 40 mg via INTRAVENOUS
  Filled 2013-05-24: qty 4

## 2013-05-24 NOTE — ED Notes (Signed)
Admitting physician at bedside

## 2013-05-24 NOTE — Consult Note (Signed)
Name: [name] MRN: [id number] DOB: 05/12/1940    ADMISSION DATE:  05/24/2013 CONSULTATION DATE:  05/24/2013  REFERRING MD :  Dr. Grandville Silos PRIMARY SERVICE: [facility]  CHIEF COMPLAINT:  Respiratory failure  BRIEF PATIENT DESCRIPTION: 73 year old male with PMH of COPD and CAD presenting with 5 day history of SOB.  Patient was in his regular state of health (not O2 dependent and no inhalers at home) until 5 days when he was in a crowd of people (which is unusual for him) then started coughing but was unable to produce any sputum.  On the day PTP was febrile and with cold sweats.  On day of presentation began having more SOB then finally decided to come to the ED.  SIGNIFICANT EVENTS / STUDIES:  1/30 admission requiring BiPAP.  LINES / TUBES: PIV  CULTURES: Blood 1/30>>> Sputum 1/30>>> Urine 1/30>>>  ANTIBIOTICS: Rocephin 1/30>>> Zithromax 1/30>>>  PAST MEDICAL HISTORY :  Past Medical History  Diagnosis Date  . Venous insufficiency   . Peripheral arterial disease     a. Previous left lower extremity stenting by Dr. Jamal Collin;  b. 12/2012 s/p bilat ostial common iliac stenting.  . Tobacco abuse   . COPD (chronic obstructive pulmonary disease)   . Hypothyroidism   . Subclavian artery stenosis, left 05/2012    Status post stenting of the ostium and self-expanding stent placement to the left axillary artery  . Right leg DVT 08/13/2012  . Intraventricular hemorrhage 07/12/2012  . Hypertension   . CHF (congestive heart failure)   . Arthritis     "hands; not dx'd" (12/26/2012)  . Bilateral renal artery stenosis   . Aortic calcification 05/01/2013   Past Surgical History  Procedure Laterality Date  . Angioplasty / stenting femoral  05/2012  . Coronary artery bypass graft  07/11/2011    Procedure: CORONARY ARTERY BYPASS GRAFTING (CABG);  Surgeon: Grace Isaac, MD;  Location: [facility];  Service: Open Heart Surgery;  Laterality: N/A;  Times 3. On Pump. Using right greater saphenous vein  and left internal mammary artery.   . Cardiac catheterization  2/14    [facility]  . Subclavian artery stent  05/2012    "2 stents" (12/26/2012)  . Vena cava filter placement  07/2012    Removable  . Angioplasty / stenting iliac Bilateral 12/26/2012  . Cholecystectomy  1990's   Prior to Admission medications   Medication Sig Start Date End Date Taking? Authorizing Provider  acetaminophen (TYLENOL) 500 MG tablet Take 1,000 mg by mouth every 8 (eight) hours as needed for mild pain.   Yes Historical Provider, MD  amLODipine (NORVASC) 10 MG tablet Take 10 mg by mouth daily.   Yes Historical Provider, MD  aspirin EC 81 MG tablet Take 81 mg by mouth daily.   Yes Historical Provider, MD  atorvastatin (LIPITOR) 20 MG tablet Take 20 mg by mouth daily.   Yes Historical Provider, MD  carvedilol (COREG) 3.125 MG tablet Take 3.125 mg by mouth 2 (two) times daily with a meal.   Yes Historical Provider, MD  levothyroxine (SYNTHROID, LEVOTHROID) 200 MCG tablet Take 200 mcg by mouth daily before breakfast.   Yes Historical Provider, MD  lisinopril (PRINIVIL,ZESTRIL) 5 MG tablet Take 5 mg by mouth daily.   Yes Historical Provider, MD  oseltamivir (TAMIFLU) 75 MG capsule Take 75 mg by mouth 2 (two) times daily.   Yes Historical Provider, MD   Allergies  Allergen Reactions  . Plavix [Clopidogrel Bisulfate]  Brain hemorrhage prev while on plavix and aspirin    FAMILY HISTORY:  Family History  Problem Relation Age of Onset  . Alcohol abuse Father   . Cirrhosis Father   . Hypothyroidism Sister    SOCIAL HISTORY:  reports that he quit smoking about 2 years ago. His smoking use included Cigarettes. He has a 50 pack-year smoking history. He has never used smokeless tobacco. He reports that he does not drink alcohol or use illicit drugs.  REVIEW OF SYSTEMS:  12 point ROS is negative other than mentioned in the HPI  SUBJECTIVE:   VITAL SIGNS: Temp:  [98 F (36.7 C)] 98 F (36.7 C) (01/30 1454) Pulse Rate:   [106-153] 106 (01/30 1715) Resp:  [15-40] 31 (01/30 1715) BP: (97-141)/(43-84) 141/54 mmHg (01/30 1715) SpO2:  [93 %-100 %] 93 % (01/30 1715) HEMODYNAMICS:   VENTILATOR SETTINGS:  BiPAP 8/5 with 40% FiO2. INTAKE / OUTPUT: Intake/Output   None     PHYSICAL EXAMINATION: General:  Well appearing, moderate respiratory distress off the BiPAP but comfortable on BiPAP. Neuro:  Awake, alert and interactive, moving all ext to command. HEENT:  [place]/AT, PERRL, EOM-I and MMM. Cardiovascular:  RRR, Nl S1/S2, -M/R/G. Lungs:  Mild wheezing diffusely. Abdomen:  Soft, NT, ND and +BS. Musculoskeletal:  -edema and -tenderness. Skin:  Intact.  LABS:  CBC  Recent Labs Lab 05/24/13 1510 05/24/13 1631  WBC 9.7  --   HGB 15.8 16.0  HCT 46.9 47.0  PLT 213  --    Coag's No results found for this basename: APTT, INR,  in the last 168 hours BMET  Recent Labs Lab 05/24/13 1510 05/24/13 1631  NA 135* 136*  K 5.0 4.5  CL 97 104  CO2 19  --   BUN 37* 37*  CREATININE 2.32* 2.30*  GLUCOSE 114* 113*   Electrolytes  Recent Labs Lab 05/24/13 1510  CALCIUM 9.4   Sepsis Markers  Recent Labs Lab 05/24/13 1519 05/24/13 1632  LATICACIDVEN 2.26* 1.95   ABG  Recent Labs Lab 05/24/13 1633  PHART 7.390  PCO2ART 30.6*  PO2ART 135.0*   Liver Enzymes  Recent Labs Lab 05/24/13 1510  AST 36  ALT 23  ALKPHOS 80  BILITOT 0.9  ALBUMIN 3.3*   Cardiac Enzymes  Recent Labs Lab 05/24/13 1615  PROBNP 1493.0*   Glucose No results found for this basename: GLUCAP,  in the last 168 hours  Imaging Dg Chest Port 1 View  05/24/2013   CLINICAL DATA:  Productive cough, shortness of breath, history COPD, hypertension, smoking, CHF  EXAM: PORTABLE CHEST - 1 VIEW  COMPARISON:  Portable exam 1528 hr compared to 07/15/2012  FINDINGS: Normal heart size post median sternotomy.  Atherosclerotic calcification aorta.  Mediastinal contours and pulmonary vascularity normal.  Emphysematous changes  with hazy left lower lobe infiltrate.  Remaining lungs clear.  No pleural effusion or pneumothorax.  IMPRESSION: Post CABG.  Hazy left lower lobe infiltrate.   Electronically Signed   By: Lavonia Dana M.D.   On: 05/24/2013 15:38     CXR: Pulmonary vascular congestion and LLL infiltrate.  ASSESSMENT / PLAN:  PULMONARY A: Respiratory failure, not a COPD exacerbation, likely flu vs PNA in a patient with baseline COPD.  Unknown FEV1.  ?if pulmonary edema is an active component here. P:   - Treat CAP (rocephin and zithromax). - Pan culture. - Tamiflu daily. - Respiratory viral panel. - Agree with bronchodilatory regiment. - IS and flutter valve. - Mucinex. - Single  dose of lasix.  CARDIOVASCULAR A: CAD history.  Unknown EF. P:  - Check BNP. - Single dose of lasix. - Will defer to primary whether or not to order an echo.  RENAL A:  Cr elevated, no previous history. P:   - Per primary.  GASTROINTESTINAL A:  No active issues. P:   - NPO while on BiPAP, but imagine will tolerate diet just fine.  HEMATOLOGIC A:  No active issues. P:  - Monitor CBC.  INFECTIOUS A:  CAP vs flu. P:   - No steroids. - Rocephin and zithromax. - Pan culture. - Tamiflu. - Respiratory viral panel.  ENDOCRINE A:  No active issues.   P:   - Monitor.  NEUROLOGIC A:  No active issues. P:   - Minimize sedating medications.  TODAY'S SUMMARY: 73 year old male with COPD history that is very mild.  Flu vs CAP, requiring BiPAP.  Ok to admit to SDU.  I have personally obtained a history, examined the patient, evaluated laboratory and imaging results, formulated the assessment and plan and placed orders.  CRITICAL CARE: The patient is critically ill with multiple organ systems failure and requires high complexity decision making for assessment and support, frequent evaluation and titration of therapies, application of advanced monitoring technologies and extensive interpretation of multiple  databases. Critical Care Time devoted to patient care services described in this note is 35 minutes.   Rush Farmer, M.D. [facility] Pager: [phone]  05/24/2013, 5:36 PM

## 2013-05-24 NOTE — ED Notes (Signed)
Critical care at bedside  

## 2013-05-24 NOTE — H&P (Signed)
[facility] History and Physical  [name] UXL:[id number] DOB: 1940/06/22 DOA: 05/24/2013  Referring physician: Dr.Pickering PCP: Owens Loffler, MD   Chief Complaint: SOB  HPI: [name] is a 73 y.o. male  With significant cardiac history including coronary artery disease status post CABG March of 2013, peripheral artery disease status post stenting to bilateral ostial common iliac 9 2014, history of COPD, hypothyroidism, history of intraventricular hemorrhage, hypertension, CHF, bilateral renal artery stenosis presented to the ED with a four-day history of worsening shortness of breath, fever with a temp up to 102, generalized weakness, and fatigue, orthopnea, nonproductive cough, dizziness, diaphoresis, chills, an episode of diarrhea after starting Tamiflu. Patient denies any chest pain, no emesis, no palpitations, no abdominal pain, no constipation, no dysuria. Patient does endorse decreased appetite. Patient was seen by his PCP 2 days prior to admission and treated empirically with Tamiflu for possible influenza. Patient stated experience side effects from the Tamiflu and took 2 days worth and is not interested in resuming it. Patient had presented to the ED with worsening shortness of breath and extreme is with respiratory rate in the high 30s, heart rate in the 150s temporarily in A. fib and converted back into normal sinus rhythm, basic metabolic profile with a creatinine of 2.3. Initial lactic acid level was 2.26. Point-of-care troponin was negative. Pro BNP was elevated at 1493. CBC was within normal limits. Chest x-ray with hazy left lower lobe infiltrate. Initial EKG with atrial fibrillation repeat EKG with sinus tachycardia. Patient was placed on the BiPAP in the ED with some improvement per patient. Patient was given IV Rocephin and azithromycin. We were called to admit the patient for further evaluation and management.   Review of Systems: Per history of present illness  otherwise negative. Constitutional:  No weight loss,  HEENT:  No headaches, Difficulty swallowing,Tooth/dental problems,Sore throat,  No sneezing, itching, ear ache, nasal congestion, post nasal drip,  Cardio-vascular:  No chest pain,  PND, swelling in lower extremities, anasarca, palpitations  GI:  No heartburn, indigestion, abdominal pain, nausea, vomiting, diarrhea, change in bowel habits, loss of appetite  Resp:  No shortness of breath with exertion or at rest. No excess mucus, no productive cough, No non-productive cough, No coughing up of blood.No change in color of mucus.No wheezing.No chest wall deformity  Skin:  no rash or lesions.  GU:  no dysuria, change in color of urine, no urgency or frequency. No flank pain.  Musculoskeletal:  No joint pain or swelling. No decreased range of motion. No back pain.  Psych:  No change in mood or affect. No depression or anxiety. No memory loss.   Past Medical History  Diagnosis Date  . Venous insufficiency   . Peripheral arterial disease     a. Previous left lower extremity stenting by Dr. Jamal Collin;  b. 12/2012 s/p bilat ostial common iliac stenting.  . Tobacco abuse   . COPD (chronic obstructive pulmonary disease)   . Hypothyroidism   . Subclavian artery stenosis, left 05/2012    Status post stenting of the ostium and self-expanding stent placement to the left axillary artery  . Right leg DVT 08/13/2012  . Intraventricular hemorrhage 07/12/2012  . Hypertension   . CHF (congestive heart failure)   . Arthritis     "hands; not dx'd" (12/26/2012)  . Bilateral renal artery stenosis   . Aortic calcification 05/01/2013   Past Surgical History  Procedure Laterality Date  . Angioplasty / stenting femoral  05/2012  .  Coronary artery bypass graft  07/11/2011    Procedure: CORONARY ARTERY BYPASS GRAFTING (CABG);  Surgeon: Grace Isaac, MD;  Location: [place];  Service: Open Heart Surgery;  Laterality: N/A;  Times 3. On Pump. Using right greater  saphenous vein and left internal mammary artery.   . Cardiac catheterization  2/14    [facility]  . Subclavian artery stent  05/2012    "2 stents" (12/26/2012)  . Vena cava filter placement  07/2012    Removable  . Angioplasty / stenting iliac Bilateral 12/26/2012  . Cholecystectomy  1990's   Social History:  reports that he quit smoking about 2 years ago. His smoking use included Cigarettes. He has a 50 pack-year smoking history. He has never used smokeless tobacco. He reports that he does not drink alcohol or use illicit drugs.  Allergies  Allergen Reactions  . Plavix [Clopidogrel Bisulfate]     Brain hemorrhage prev while on plavix and aspirin    Family History  Problem Relation Age of Onset  . Alcohol abuse Father   . Cirrhosis Father   . Hypothyroidism Sister      Prior to Admission medications   Medication Sig Start Date End Date Taking? Authorizing Provider  acetaminophen (TYLENOL) 500 MG tablet Take 1,000 mg by mouth every 8 (eight) hours as needed for mild pain.   Yes Historical Provider, MD  amLODipine (NORVASC) 10 MG tablet Take 10 mg by mouth daily.   Yes Historical Provider, MD  aspirin EC 81 MG tablet Take 81 mg by mouth daily.   Yes Historical Provider, MD  atorvastatin (LIPITOR) 20 MG tablet Take 20 mg by mouth daily.   Yes Historical Provider, MD  carvedilol (COREG) 3.125 MG tablet Take 3.125 mg by mouth 2 (two) times daily with a meal.   Yes Historical Provider, MD  levothyroxine (SYNTHROID, LEVOTHROID) 200 MCG tablet Take 200 mcg by mouth daily before breakfast.   Yes Historical Provider, MD  lisinopril (PRINIVIL,ZESTRIL) 5 MG tablet Take 5 mg by mouth daily.   Yes Historical Provider, MD  oseltamivir (TAMIFLU) 75 MG capsule Take 75 mg by mouth 2 (two) times daily.   Yes Historical Provider, MD   Physical Exam: Filed Vitals:   05/24/13 1715  BP: 141/54  Pulse: 106  Temp:   Resp: 31    BP 141/54  Pulse 106  Temp(Src) 98 F (36.7 C)  Resp 31  SpO2 93%  General:   Patient is using accessory muscles of respiration. Breathing is labored. Eyes: PERRLA, EOMI, normal lids, irises & conjunctiva ENT: grossly normal hearing, lips & tongue. Dry mucous membranes Neck: no LAD, masses or thyromegaly Cardiovascular: Tachycardic, no m/r/g. No LE edema. Telemetry: SR, no arrhythmias  Respiratory: Use of accessory muscles of respiration. Some coarse breath sounds. No wheezing noted. Tight. Abdomen: soft, ntnd, positive bowel sounds. Skin: no rash or induration seen on limited exam Musculoskeletal: grossly normal tone BUE/BLE Psychiatric: grossly normal mood and affect, speech fluent and appropriate Neurologic: Alert and oriented x3. Cranial nerves II through XII are grossly intact. Sensation is intact. Visual fields are intact. Gait not tested secondary to safety.           Labs on Admission:  Basic Metabolic Panel:  Recent Labs Lab 05/24/13 1510 05/24/13 1631  NA 135* 136*  K 5.0 4.5  CL 97 104  CO2 19  --   GLUCOSE 114* 113*  BUN 37* 37*  CREATININE 2.32* 2.30*  CALCIUM 9.4  --  Liver Function Tests:  Recent Labs Lab 05/24/13 1510  AST 36  ALT 23  ALKPHOS 80  BILITOT 0.9  PROT 7.6  ALBUMIN 3.3*   No results found for this basename: LIPASE, AMYLASE,  in the last 168 hours No results found for this basename: AMMONIA,  in the last 168 hours CBC:  Recent Labs Lab 05/24/13 1510 05/24/13 1631  WBC 9.7  --   NEUTROABS 6.2  --   HGB 15.8 16.0  HCT 46.9 47.0  MCV 89.3  --   PLT 213  --    Cardiac Enzymes: No results found for this basename: CKTOTAL, CKMB, CKMBINDEX, TROPONINI,  in the last 168 hours  BNP (last 3 results)  Recent Labs  07/16/12 1550 05/24/13 1615  PROBNP 9072.0* 1493.0*   CBG: No results found for this basename: GLUCAP,  in the last 168 hours  Radiological Exams on Admission: Dg Chest Port 1 View  05/24/2013   CLINICAL DATA:  Productive cough, shortness of breath, history COPD, hypertension, smoking, CHF   EXAM: PORTABLE CHEST - 1 VIEW  COMPARISON:  Portable exam 1528 hr compared to 07/15/2012  FINDINGS: Normal heart size post median sternotomy.  Atherosclerotic calcification aorta.  Mediastinal contours and pulmonary vascularity normal.  Emphysematous changes with hazy left lower lobe infiltrate.  Remaining lungs clear.  No pleural effusion or pneumothorax.  IMPRESSION: Post CABG.  Hazy left lower lobe infiltrate.   Electronically Signed   By: Lavonia Dana M.D.   On: 05/24/2013 15:38    EKG: Independently reviewed. Atrial fibrillation  Assessment/Plan Principal Problem:   Acute respiratory failure Active Problems:   CAP (community acquired pneumonia)   SIRS (systemic inflammatory response syndrome)   HYPOTHYROIDISM NOS   HYPERCHOLESTEROLEMIA   COPD (chronic obstructive pulmonary disease)   CAD (coronary artery disease), native coronary artery   Ischemic cardiomyopathy   HTN (hypertension)   ARF (acute renal failure)   Paroxysmal a-fib: briefly in ED now in NSR  #1 acute respiratory failure Patient presented in extremis in acute respiratory failure with use of accessory muscles of respiration. Patient noted to be tachycardic with initial heart rate in the 150s and presented with temperature 102. Chest x-ray with left lower lobe infiltrate. Will admit the patient to the step down uniT versus ICU pending critical care evaluation. Will check blood cultures x2. Check a sputum Gram stain and culture. Check a urine Legionella antigen. Check a urine pneumococcus antigen. Check a pro calcitonin level. Influenza PCR pending. Place empirically on IV Rocephin, IV azithromycin, Mucinex, scheduled nebulizers, oxygen. BIPAP prn. Consult with PCCM.  #2 systemic inflammatory response syndrome Patient presented with fever, tachycardia, increased respiratory rate and chest x-ray consistent with an infiltrate. Blood cultures are pending. Check a UA with cultures and sensitivities. Check a sputum Gram stain and  culture. Check pro calcitonin levels. Placed empirically on IV azithromycin and IV Rocephin. Follow.  #3 community acquired pneumonia Per chest x-ray. Patient presented with worsening shortness of breath and in acute respiratory distress. Check a sputum Gram stain and culture. Check blood cultures x2. Check a urine Legionella antigen. Check a urine pneumococcus antigen. Placed empirically on IV Rocephin and IV azithromycin. Mucinex. Scheduled nebulizer treatments. BiPAP as needed.  #4 hypertension Blood pressure is borderline. Will hold ACE inhibitor and Norvasc for now.  #5 paroxysmal atrial fibrillation On admission in the ED patient noted to go into A. fib with heart rate in the 150s and spontaneously converted back to normal sinus rhythm. Likely secondary  to problem #12 and 3. Will cycle cardiac enzymes. Cardiac enzymes are positive will check a 2-D echo. We'll monitor for now. Continue home beta blocker.  #6 acute renal failure Likely secondary to prerenal azotemia. Patient with decreased oral intake and had 2 days of diarrhea. Will check a urine sodium. Check a urine creatinine. Check a renal ultrasound. Hold ACE inhibitor. Hydrate gently with IV fluids. Follow. If no improvement or worsening may need a renal consult.  #7 hyperlipidemia Continue statin.  #8 coronary artery disease/status post CABG/history of CHF/history of ischemic cardiomyopathy. Stable. Continue beta blocker, aspirin, statin. Follow.  #9 severe peripheral artery disease Status post multiple stents to the iliacs, left axillary artery. Continue aspirin. Follow.  #10 COPD Stable. Nebs.  #11 prophylaxis PPI for GI prophylaxis. SCDs for DVT prophylaxis.   Code Status: Full Family Communication: Updated patient and wife at bedside. Disposition Plan: Admit to the step down unit versus ICU pending critical care evaluation  Time spent: 70 mins  [facility] Pager [phone]

## 2013-05-24 NOTE — Telephone Encounter (Signed)
i completely agree and he has a very tenuous health history in the last couple of years and could need hospitalization.

## 2013-05-24 NOTE — ED Notes (Signed)
Sob x 3 days went to dr placed on tamaflu not better pt very sob on exertion has hx of cabg

## 2013-05-24 NOTE — ED Provider Notes (Signed)
CSN: [id number]     Arrival date & time 05/24/13  1448 History   First MD Initiated Contact with Patient 05/24/13 1500     Chief Complaint  Patient presents with  . Shortness of Breath   (Consider location/radiation/quality/duration/timing/severity/associated sxs/prior Treatment) Patient is a 73 y.o. male presenting with shortness of breath. The history is provided by the patient.  Shortness of Breath Associated symptoms: fever   Associated symptoms: no abdominal pain, no chest pain, no headaches, no rash and no vomiting    patient presents with shortness of breath. He has had fevers and chills. He was seen by his primary care Dr. 2 days ago after a few days of symptoms and was given Tamiflu and the clinical diagnosis of influenza. His lungs were reportedly clear at that time. Since then he has done worse. He continues to have fevers up to 102. He has had increased trouble breathing. No chest pain. He has myalgias. He states he is fatigued he cannot lie down. No abdominal pain. No confusion. No sputum production  Past Medical History  Diagnosis Date  . Venous insufficiency   . Peripheral arterial disease     a. Previous left lower extremity stenting by Dr. Evette Cristal;  b. 12/2012 s/p bilat ostial common iliac stenting.  . Tobacco abuse   . COPD (chronic obstructive pulmonary disease)   . Hypothyroidism   . Subclavian artery stenosis, left 05/2012    Status post stenting of the ostium and self-expanding stent placement to the left axillary artery  . Right leg DVT 08/13/2012  . Intraventricular hemorrhage 07/12/2012  . Hypertension   . CHF (congestive heart failure)   . Arthritis     "hands; not dx'd" (12/26/2012)  . Bilateral renal artery stenosis   . Aortic calcification 05/01/2013   Past Surgical History  Procedure Laterality Date  . Angioplasty / stenting femoral  05/2012  . Coronary artery bypass graft  07/11/2011    Procedure: CORONARY ARTERY BYPASS GRAFTING (CABG);  Surgeon: [name], MD;  Location: [facility];  Service: Open Heart Surgery;  Laterality: N/A;  Times 3. On Pump. Using right greater saphenous vein and left internal mammary artery.   . Cardiac catheterization  2/14    [facility]  . Subclavian artery stent  05/2012    "2 stents" (12/26/2012)  . Vena cava filter placement  07/2012    Removable  . Angioplasty / stenting iliac Bilateral 12/26/2012  . Cholecystectomy  1990's  . Hernia repair     Family History  Problem Relation Age of Onset  . Alcohol abuse Father   . Cirrhosis Father   . Hypothyroidism Sister    History  Substance Use Topics  . Smoking status: Former Smoker -- 1.00 packs/day for 50 years    Types: Cigarettes    Quit date: 04/27/2011  . Smokeless tobacco: Never Used  . Alcohol Use: No    Review of Systems  Constitutional: Positive for fever, appetite change and fatigue. Negative for activity change.  Eyes: Negative for pain.  Respiratory: Positive for shortness of breath. Negative for chest tightness.   Cardiovascular: Negative for chest pain, palpitations and leg swelling.  Gastrointestinal: Negative for nausea, vomiting, abdominal pain and diarrhea.  Genitourinary: Negative for flank pain.  Musculoskeletal: Negative for back pain and neck stiffness.  Skin: Negative for rash.  Neurological: Positive for light-headedness. Negative for weakness, numbness and headaches.  Psychiatric/Behavioral: Negative for behavioral problems.    Allergies  Plavix  Home Medications  No current outpatient prescriptions on file. BP 108/62  Pulse 131  Temp(Src) 98 F (36.7 C) (Oral)  Resp 17  Ht 5' 8.9" (1.75 m)  Wt 173 lb 11.6 oz (78.8 kg)  BMI 25.73 kg/m2  SpO2 95% Physical Exam  Nursing note and vitals reviewed. Constitutional: He is oriented to person, place, and time. He appears well-developed and well-nourished.  HENT:  Head: Normocephalic and atraumatic.  Eyes: EOM are normal. Pupils are equal, round, and reactive to light.  Neck:  Normal range of motion. Neck supple.  Cardiovascular: Normal heart sounds.   No murmur heard. Tachycardia.  Pulmonary/Chest:  Tachypnea with mild respiratory distress. Wheezes bilateral bases.  Abdominal: Soft. Bowel sounds are normal. He exhibits no distension and no mass. There is no tenderness. There is no rebound and no guarding.  Musculoskeletal: Normal range of motion. He exhibits no edema.  Neurological: He is alert and oriented to person, place, and time. No cranial nerve deficit.  Skin: Skin is warm and dry.  Psychiatric: He has a normal mood and affect.    ED Course  Procedures (including critical care time) Labs Review Labs Reviewed  COMPREHENSIVE METABOLIC PANEL - Abnormal; Notable for the following:    Sodium 135 (*)    Glucose, Bld 114 (*)    BUN 37 (*)    Creatinine, Ser 2.32 (*)    Albumin 3.3 (*)    GFR calc non Af Amer 27 (*)    GFR calc Af Amer 31 (*)    All other components within normal limits  CBC WITH DIFFERENTIAL - Abnormal; Notable for the following:    Monocytes Relative 15 (*)    Monocytes Absolute 1.5 (*)    All other components within normal limits  PRO B NATRIURETIC PEPTIDE - Abnormal; Notable for the following:    Pro B Natriuretic peptide (BNP) 1493.0 (*)    All other components within normal limits  URINALYSIS, ROUTINE W REFLEX MICROSCOPIC - Abnormal; Notable for the following:    Color, Urine AMBER (*)    APPearance CLOUDY (*)    Protein, ur 30 (*)    All other components within normal limits  COMPREHENSIVE METABOLIC PANEL - Abnormal; Notable for the following:    Glucose, Bld 131 (*)    BUN 40 (*)    Creatinine, Ser 2.30 (*)    Albumin 2.9 (*)    GFR calc non Af Amer 27 (*)    GFR calc Af Amer 31 (*)    All other components within normal limits  PROTIME-INR - Abnormal; Notable for the following:    Prothrombin Time 17.1 (*)    All other components within normal limits  APTT - Abnormal; Notable for the following:    aPTT 39 (*)     All other components within normal limits  URINE MICROSCOPIC-ADD ON - Abnormal; Notable for the following:    Casts GRANULAR CAST (*)    All other components within normal limits  CG4 I-STAT (LACTIC ACID) - Abnormal; Notable for the following:    Lactic Acid, Venous 2.26 (*)    All other components within normal limits  POCT I-STAT, CHEM 8 - Abnormal; Notable for the following:    Sodium 136 (*)    BUN 37 (*)    Creatinine, Ser 2.30 (*)    Glucose, Bld 113 (*)    All other components within normal limits  POCT I-STAT 3, BLOOD GAS (G3+) - Abnormal; Notable for the following:    pCO2  arterial 30.6 (*)    pO2, Arterial 135.0 (*)    Bicarbonate 18.5 (*)    Acid-base deficit 5.0 (*)    All other components within normal limits  MRSA PCR SCREENING  CULTURE, BLOOD (ROUTINE X 2)  CULTURE, BLOOD (ROUTINE X 2)  URINE CULTURE  RESPIRATORY VIRUS PANEL  CULTURE, EXPECTORATED SPUTUM-ASSESSMENT  GRAM STAIN  SODIUM, URINE, RANDOM  CREATININE, URINE, RANDOM  PROCALCITONIN  STREP PNEUMONIAE URINARY ANTIGEN  MAGNESIUM  TROPONIN I  INFLUENZA PANEL BY PCR (TYPE A & B, H1N1)  PROCALCITONIN  HIV ANTIBODY (ROUTINE TESTING)  LEGIONELLA ANTIGEN, URINE  TSH  COMPREHENSIVE METABOLIC PANEL  CBC WITH DIFFERENTIAL  TROPONIN I  TROPONIN I  POCT I-STAT TROPONIN I  CG4 I-STAT (LACTIC ACID)   Imaging Review Dg Chest Port 1 View  05/24/2013   CLINICAL DATA:  Shortness of breath. Current history of COPD. Prior history of CHF.  EXAM: PORTABLE CHEST - 1 VIEW 05/24/2013 2111 hr:  COMPARISON:  DG CHEST 1V PORT dated 05/24/2013 at 1528 hr ; CT ANGIO CHEST W/CM &/OR WO/CM dated 09/13/2012; CT ANGIO CHEST W/CM dated 07/15/2012; DG CHEST 2 VIEW dated 07/15/2012  FINDINGS: Prior sternotomy for CABG. Cardiac silhouette normal in size for the AP portable technique. Interstitial opacities throughout the left lung, similar to the examination earlier same date. This is superimposed upon baseline changes of chronic bronchitis  and emphysema. Right lung remains clear. No new pulmonary parenchymal abnormalities.  IMPRESSION: Asymmetric interstitial pulmonary edema versus interstitial pneumonia involving the left lung, superimposed upon COPD/emphysema, unchanged since earlier the same date. No new abnormalities.   Electronically Signed   By: Evangeline Dakin M.D.   On: 05/24/2013 21:39   Dg Chest Port 1 View  05/24/2013   CLINICAL DATA:  Productive cough, shortness of breath, history COPD, hypertension, smoking, CHF  EXAM: PORTABLE CHEST - 1 VIEW  COMPARISON:  Portable exam 1528 hr compared to 07/15/2012  FINDINGS: Normal heart size post median sternotomy.  Atherosclerotic calcification aorta.  Mediastinal contours and pulmonary vascularity normal.  Emphysematous changes with hazy left lower lobe infiltrate.  Remaining lungs clear.  No pleural effusion or pneumothorax.  IMPRESSION: Post CABG.  Hazy left lower lobe infiltrate.   Electronically Signed   By: Lavonia Dana M.D.   On: 05/24/2013 15:38     Date: 05/24/2013 15:16  Rate: 115  Rhythm: sinus tachycardia  QRS Axis: normal  Intervals: normal  ST/T Wave abnormalities: nonspecific ST/T changes  Conduction Disutrbances:none  Narrative Interpretation: afib has resolved  Old EKG Reviewed: changes noted    MDM   1. CAP (community acquired pneumonia)   2. Renal insufficiency   3. Atrial fibrillation with RVR   4. Acute respiratory failure   5. SIRS (systemic inflammatory response syndrome)   6. ARF (acute renal failure)   7. COPD (chronic obstructive pulmonary disease)    Patient presented with shortness of breath. On Tamiflu for presumptive influenza. X-ray done now and shows infiltrate. Has had A. fib RVR, but resolved spontaneously. Likely related to respiratory status. He has had some moderate respiratory failure initially required BiPAP. Felt somewhat better after it remained somewhat dyspneic. He'll remain tachycardic. He had an improvement of his wheezes.  Admitted to internal medicine to a step down bed, however they consulted critical care. Critical care saw the patient and thought he was safe for stepdown placement.  CRITICAL CARE Performed by: Mackie Pai Total critical care time: 30 Critical care time was exclusive of separately billable procedures  and treating other patients. Critical care was necessary to treat or prevent imminent or life-threatening deterioration. Critical care was time spent personally by me on the following activities: development of treatment plan with patient and/or surrogate as well as nursing, discussions with consultants, evaluation of patient's response to treatment, examination of patient, obtaining history from patient or surrogate, ordering and performing treatments and interventions, ordering and review of laboratory studies, ordering and review of radiographic studies, pulse oximetry and re-evaluation of patient's condition.     Jasper Riling. Alvino Chapel, MD 05/25/13 [phone]

## 2013-05-24 NOTE — Progress Notes (Signed)
Pt taken off Bipap post ABG results. MD aware. RT will monitor.

## 2013-05-24 NOTE — ED Notes (Signed)
Pt plpaced on 2 l o2

## 2013-05-24 NOTE — ED Notes (Signed)
Labs results was reported to [place].

## 2013-05-24 NOTE — Telephone Encounter (Signed)
Patient Information:  Caller Name: Joycelyn Schmid  Phone: [phone]  Patient: [name], [name]  Gender: Male  DOB: 1940/07/09  Age: 73 Years  PCP: Owens Loffler (Family Practice)  Office Follow Up:  Does the office need to follow up with this patient?: No  Instructions For The Office: N/A  RN Note:  Called to the office for direction on this patient.Marland Kitchen  Spoke with Riki Sheer, RN who advised that patient should go to the ER for evaluation asap per his significant health history.  Spoke with patients wife and she agreed.  Wife asked if [place] can be called in advance.  Advised will do this (as a courtesy) to this patient.   Symptoms  Reason For Call & Symptoms: Became ill on Monday 05/20/2013.  Was seen in the office on Wednesday 05/22/2013.  He was having shortness of breath at that time.  He seems to be worse today per his wife.  His breathing was very labored last night and he did not sleep.  He has developed diarrhea with the Tamiflu.  He wants to know if it would be ok if he stopped the Tamiflu.  His breathing appears to be better today and he is sleeping now.  He had diarrhea overnight 5x.  He has mild COPD per his wife. Wife contradicts herself and says that he is still having to work to breathe, however, it is not as labored as it was.  She states that he is having to make a very concious effort to stay calm and be able to breathe easily.  Reviewed Health History In EMR: Yes  Reviewed Medications In EMR: Yes  Reviewed Allergies In EMR: Yes  Reviewed Surgeries / Procedures: Yes  Date of Onset of Symptoms: 05/20/2013  Treatments Tried: Tamiflu  Treatments Tried Worked: No  Any Fever: Yes  Fever Taken: Oral  Fever Time Of Reading: 00:00:00  Fever Last Reading: 9  Guideline(s) Used:  Influenza Follow-Up Call  Disposition Per Guideline:   Go to ED Now (or to Office with PCP Approval)  Reason For Disposition Reached:   Patient sounds very sick or weak to the triager  Advice  Given:  N/A  Patient Will Follow Care Advice:  YES

## 2013-05-24 NOTE — ED Notes (Signed)
Lab at bedside

## 2013-05-25 ENCOUNTER — Inpatient Hospital Stay (HOSPITAL_COMMUNITY): Payer: Medicare Other

## 2013-05-25 DIAGNOSIS — I4891 Unspecified atrial fibrillation: Secondary | ICD-10-CM

## 2013-05-25 LAB — CBC WITH DIFFERENTIAL/PLATELET
Basophils Absolute: 0 10*3/uL (ref 0.0–0.1)
Basophils Relative: 0 % (ref 0–1)
EOS PCT: 1 % (ref 0–5)
Eosinophils Absolute: 0.1 10*3/uL (ref 0.0–0.7)
HEMATOCRIT: 39.1 % (ref 39.0–52.0)
Hemoglobin: 13.2 g/dL (ref 13.0–17.0)
LYMPHS ABS: 1.3 10*3/uL (ref 0.7–4.0)
Lymphocytes Relative: 17 % (ref 12–46)
MCH: 29.7 pg (ref 26.0–34.0)
MCHC: 33.8 g/dL (ref 30.0–36.0)
MCV: 87.9 fL (ref 78.0–100.0)
MONO ABS: 1.4 10*3/uL — AB (ref 0.1–1.0)
MONOS PCT: 19 % — AB (ref 3–12)
NEUTROS ABS: 4.6 10*3/uL (ref 1.7–7.7)
Neutrophils Relative %: 63 % (ref 43–77)
Platelets: 201 10*3/uL (ref 150–400)
RBC: 4.45 MIL/uL (ref 4.22–5.81)
RDW: 14.1 % (ref 11.5–15.5)
WBC: 7.4 10*3/uL (ref 4.0–10.5)

## 2013-05-25 LAB — INFLUENZA PANEL BY PCR (TYPE A & B)
H1N1FLUPCR: NOT DETECTED
Influenza A By PCR: NEGATIVE
Influenza B By PCR: NEGATIVE

## 2013-05-25 LAB — COMPREHENSIVE METABOLIC PANEL
ALBUMIN: 2.7 g/dL — AB (ref 3.5–5.2)
ALT: 22 U/L (ref 0–53)
AST: 32 U/L (ref 0–37)
Alkaline Phosphatase: 64 U/L (ref 39–117)
BILIRUBIN TOTAL: 0.5 mg/dL (ref 0.3–1.2)
BUN: 41 mg/dL — AB (ref 6–23)
CHLORIDE: 102 meq/L (ref 96–112)
CO2: 18 mEq/L — ABNORMAL LOW (ref 19–32)
Calcium: 8.4 mg/dL (ref 8.4–10.5)
Creatinine, Ser: 2.22 mg/dL — ABNORMAL HIGH (ref 0.50–1.35)
GFR calc Af Amer: 33 mL/min — ABNORMAL LOW (ref >90)
GFR calc non Af Amer: 28 mL/min — ABNORMAL LOW (ref >90)
Glucose, Bld: 126 mg/dL — ABNORMAL HIGH (ref 70–99)
Potassium: 4.1 mEq/L (ref 3.7–5.3)
Sodium: 138 mEq/L (ref 137–147)
TOTAL PROTEIN: 6.3 g/dL (ref 6.0–8.3)

## 2013-05-25 LAB — TSH: TSH: 0.459 u[IU]/mL (ref 0.350–4.500)

## 2013-05-25 LAB — PROCALCITONIN: PROCALCITONIN: 4.05 ng/mL

## 2013-05-25 LAB — TROPONIN I
Troponin I: <0.3 ng/mL (ref <0.30)
Troponin I: <0.3 ng/mL (ref <0.30)

## 2013-05-25 LAB — LEGIONELLA ANTIGEN, URINE: Legionella Antigen, Urine: NEGATIVE

## 2013-05-25 LAB — HIV ANTIBODY (ROUTINE TESTING W REFLEX): HIV: NONREACTIVE

## 2013-05-25 MED ORDER — HEPARIN SODIUM (PORCINE) 5000 UNIT/ML IJ SOLN
5000.0000 [IU] | Freq: Three times a day (TID) | INTRAMUSCULAR | Status: DC
  Filled 2013-05-25 (×5): qty 1

## 2013-05-25 MED ORDER — AZITHROMYCIN 500 MG PO TABS
500.0000 mg | ORAL_TABLET | Freq: Every day | ORAL | Status: DC
  Administered 2013-05-25 – 2013-05-29 (×5): 500 mg via ORAL
  Filled 2013-05-25 (×6): qty 1

## 2013-05-25 NOTE — Evaluation (Signed)
Physical Therapy Evaluation Patient Details Name: [name] MRN: [id number] DOB: 1940/10/08 Today's Date: 05/25/2013 Time: 0315-9458 PT Time Calculation (min): 27 min  PT Assessment / Plan / Recommendation History of Present Illness  Pt admitted with SOB. Pt with extensive cardiac history  Clinical Impression  Pt mild deconditioned with noted SOB/dypnea with transfers and ambulation. SpO2 dec to 85% on 2LO2 during ambulation with RW. Suspect pt will progress well enough to return home with wife once medically stable. Acute Pt to con't to work with pt to progress ambulation and monitor O2 saturation.    PT Assessment  Patient needs continued PT services    Follow Up Recommendations  No PT follow up;Supervision/Assistance - 24 hour    Does the patient have the potential to tolerate intense rehabilitation      Barriers to Discharge        Equipment Recommendations  None recommended by PT    Recommendations for Other Services     Frequency Min 3X/week    Precautions / Restrictions Precautions Precaution Comments: watch O2 saturation Restrictions Weight Bearing Restrictions: No   Pertinent Vitals/Pain SATURATION QUALIFICATIONS: (This note is used to comply with regulatory documentation for home oxygen)  Patient Saturations on 2 Liters of oxygen while Ambulating = 85%  :      Mobility  Bed Mobility Overal bed mobility:  (pt sitting EOB upon PT arrival) Transfers Overall transfer level: Needs assistance Equipment used: None Transfers: Sit to/from Stand Sit to Stand: Min guard Ambulation/Gait Ambulation/Gait assistance: Min guard Ambulation Distance (Feet): 100 Feet Assistive device: Rolling walker (2 wheeled) Gait Pattern/deviations: Step-through pattern General Gait Details: SpO2 dec to 85% on 2LO2 via [place]    Exercises     PT Diagnosis: Generalized weakness;Difficulty walking  PT Problem List: Decreased strength;Decreased activity tolerance;Decreased  mobility;Decreased balance;Cardiopulmonary status limiting activity PT Treatment Interventions: DME instruction;Gait training;Stair training;Functional mobility training;Therapeutic activities;Therapeutic exercise     PT Goals(Current goals can be found in the care plan section) Acute Rehab PT Goals Patient Stated Goal: "I don't want to go home on oxygen." PT Goal Formulation: With patient Time For Goal Achievement: 06/01/13 Potential to Achieve Goals: Good  Visit Information  Last PT Received On: 05/25/13 Assistance Needed: +1 History of Present Illness: Pt admitted with SOB. Pt with extensive cardiac history       Prior Functioning  Home Living Family/patient expects to be discharged to:: Private residence Living Arrangements: Spouse/significant other Available Help at Discharge: Family;Available 24 hours/day Type of Home: House Home Access: Stairs to enter [employer] of Steps: 3 Entrance Stairs-Rails: Right;Left Home Layout: One level Home Equipment: Walker - 2 wheels;Shower seat Prior Function Level of Independence: Independent Communication Communication: No difficulties Dominant Hand: Right    Cognition  Cognition Arousal/Alertness: Awake/alert Behavior During Therapy: WFL for tasks assessed/performed Overall Cognitive Status: Within Functional Limits for tasks assessed    Extremity/Trunk Assessment Upper Extremity Assessment Upper Extremity Assessment: Overall WFL for tasks assessed Lower Extremity Assessment Lower Extremity Assessment: Overall WFL for tasks assessed Cervical / Trunk Assessment Cervical / Trunk Assessment: Normal   Balance Balance Overall balance assessment: Needs assistance Sitting-balance support: Feet supported;Single extremity supported Sitting balance-Leahy Scale: Good Standing balance support: No upper extremity supported Standing balance-Leahy Scale: Fair  End of Session PT - End of Session Equipment Utilized During  Treatment: Gait belt Activity Tolerance: Patient limited by fatigue Patient left: in chair;with call bell/phone within reach Nurse Communication: Mobility status  GP     Zoee Heeney  Marie 05/25/2013, 2:48 PM  Kittie Plater, PT, DPT Pager #: [phone] Office #: [phone]

## 2013-05-25 NOTE — Progress Notes (Signed)
PHARMACIST - PHYSICIAN COMMUNICATION DR:   Wynelle Cleveland and colleagues CONCERNING: Antibiotic IV to Oral Route Change Policy  RECOMMENDATION: This patient is receiving Azithromycin by the intravenous route.  Based on criteria approved by the Pharmacy and Therapeutics Committee, the antibiotic(s) is/are being converted to the equivalent oral dose form(s).   DESCRIPTION: These criteria include:  Patient being treated for a respiratory tract infection, urinary tract infection, cellulitis or clostridium difficile associated diarrhea if on metronidazole  The patient is not neutropenic and does not exhibit a GI malabsorption state  The patient is eating (either orally or via tube) and/or has been taking other orally administered medications for a least 24 hours  The patient is improving clinically and has a Tmax < 100.5  If you have questions about this conversion, please contact the Pharmacy Department  []   [phone] )  [facility]stine Na [x]   [phone] )  [facility]ac[facility] Pontes  []   [phone] )  [facility] []   [phone] )  [place], PharmD, BCPS Clinical pharmacist, pager [phone] 05/25/2013 2:36 PM

## 2013-05-25 NOTE — Progress Notes (Signed)
[facility] Progress Note [place] TEAM 1 - Stepdown/ICU TEAM   [name] YSA:[id number] DOB: 22-Jan-1941 DOA: 05/24/2013 PCP: Owens Loffler, MD  Brief narrative: 73 year old male with PMH of COPD and CAD presenting with 5 day history of SOB. Patient was in his regular state of health (not O2 dependent and no inhalers at home) until 5 days when he was in a crowd of people (which is unusual for him) then started coughing but was unable to produce any sputum. On the day PTP was febrile and with cold sweats. On day of presentation began having more SOB then finally decided to come to the ED. He was placed on BiPAP and admitted to the ICU service.   Subjective: Mild cough-   Assessment/Plan: Principal Problem:   Acute respiratory failure / SIRS - CAP vs infleunza PNA - cont Rocephin and Azithromax - cont Tamiflu -wean O2 as able- IS, ambulate  Active Problems:    COPD (chronic obstructive pulmonary disease) - not on O2 at baseline    CAD,   Ischemic cardiomyopathy - s/p CABG and 4 stents    Hypotension with h/o HTN (hypertension) - Norvasc and lisinopril on hold     ARF on CKD 3 - ATN - cont slow NS- 75 cc/hr     Paroxysmal a-fib: briefly in ED now in NSR - d/c Amiodarone and follow in SDU over night - follows with [place] [facility]    HYPOTHYROIDISM NOS    HYPERCHOLESTEROLEMIA  Code Status: full code Family Communication: none Disposition Plan: likely home  Consultants: PCCM  Procedures: none  Antibiotics: Rocephin 1/30>>>  Zithromax 1/30>>>   DVT prophylaxis: Heparin  Objective: Blood pressure 107/77, pulse 91, temperature 97.9 F (36.6 C), temperature source Axillary, resp. rate 32, height 5' 8.9" (1.75 m), weight 78.8 kg (173 lb 11.6 oz), SpO2 92.00%.  Intake/Output Summary (Last 24 hours) at 05/25/13 0831 Last data filed at 05/25/13 3235  Gross per 24 hour  Intake    125 ml  Output    200 ml  Net    -75 ml     Exam: General:  No acute respiratory distress Lungs: b/l wheeze- 2 L O2- 91% Cardiovascular: Regular rate and rhythm without murmur gallop or rub normal S1 and S2 Abdomen: Nontender, nondistended, soft, bowel sounds positive, no rebound, no ascites, no appreciable mass Extremities: No significant cyanosis, clubbing, or edema bilateral lower extremities  Data Reviewed: Basic Metabolic Panel:  Recent Labs Lab 05/24/13 1510 05/24/13 1631 05/24/13 2000 05/25/13 0305  NA 135* 136* 139 138  K 5.0 4.5 4.5 4.1  CL 97 104 100 102  CO2 19  --  21 18*  GLUCOSE 114* 113* 131* 126*  BUN 37* 37* 40* 41*  CREATININE 2.32* 2.30* 2.30* 2.22*  CALCIUM 9.4  --  8.7 8.4  MG  --   --  2.1  --    Liver Function Tests:  Recent Labs Lab 05/24/13 1510 05/24/13 2000 05/25/13 0305  AST 36 34 32  ALT 23 22 22   ALKPHOS 80 80 64  BILITOT 0.9 0.6 0.5  PROT 7.6 7.1 6.3  ALBUMIN 3.3* 2.9* 2.7*   No results found for this basename: LIPASE, AMYLASE,  in the last 168 hours No results found for this basename: AMMONIA,  in the last 168 hours CBC:  Recent Labs Lab 05/24/13 1510 05/24/13 1631 05/25/13 0305  WBC 9.7  --  7.4  NEUTROABS 6.2  --  4.6  HGB 15.8 16.0  13.2  HCT 46.9 47.0 39.1  MCV 89.3  --  87.9  PLT 213  --  201   Cardiac Enzymes:  Recent Labs Lab 05/24/13 2000 05/25/13 0114  TROPONINI <0.30 <0.30   BNP (last 3 results)  Recent Labs  07/16/12 1550 05/24/13 1615  PROBNP 9072.0* 1493.0*   CBG: No results found for this basename: GLUCAP,  in the last 168 hours  Recent Results (from the past 240 hour(s))  MRSA PCR SCREENING     Status: None   Collection Time    05/24/13  7:46 PM      Result Value Range Status   MRSA by PCR NEGATIVE  NEGATIVE Final   Comment:            The GeneXpert MRSA Assay (FDA     approved for NASAL specimens     only), is one component of a     comprehensive MRSA colonization     surveillance program. It is not     intended to diagnose MRSA     infection  nor to guide or     monitor treatment for     MRSA infections.     Studies:  Recent x-ray studies have been reviewed in detail by the Attending Physician  Scheduled Meds:  Scheduled Meds: . aspirin EC  81 mg Oral Daily  . atorvastatin  20 mg Oral Daily  . azithromycin  500 mg Intravenous Q24H  . carvedilol  3.125 mg Oral BID WC  . cefTRIAXone (ROCEPHIN)  IV  1 g Intravenous Q24H  . guaiFENesin  1,200 mg Oral BID  . ipratropium  0.5 mg Nebulization Q6H  . levalbuterol  0.63 mg Nebulization Q6H  . levothyroxine  200 mcg Oral QAC breakfast  . oseltamivir  30 mg Oral BID  . pantoprazole  40 mg Oral Q0600   Continuous Infusions: . sodium chloride 75 mL/hr at 05/25/13 0828  . amiodarone (NEXTERONE PREMIX) 360 mg/200 mL dextrose 30 mg/hr (05/25/13 0300)    Time spent on care of this patient: 4 min   [place], MD  [facility] Office  [phone] Pager - Text Page per Shea Evans as per below:  On-Call/Text Page:      [email or web address]      password [email or web address]  If 7PM-7AM, please contact night-coverage [email or web address] Password [email or web address] 05/25/2013, 8:31 AM   LOS: 1 day

## 2013-05-25 NOTE — Progress Notes (Signed)
Name: [name]  MRN: [id number]  DOB: 1941/03/14  ADMISSION DATE: 05/24/2013  CONSULTATION DATE: 05/24/2013  REFERRING MD : Dr. Grandville Silos  PRIMARY SERVICE: TRH  CHIEF COMPLAINT: Respiratory failure  BRIEF PATIENT DESCRIPTION: 73 year old male with PMH of COPD and CAD presenting with 5 day history of SOB. Patient was in his regular state of health (not O2 dependent and no inhalers at home) until 5 days when he was in a crowd of people (which is unusual for him) then started coughing but was unable to produce any sputum. On the day PTP was febrile and with cold sweats. On day of presentation began having more SOB then finally decided to come to the ED.  SIGNIFICANT EVENTS / STUDIES:  1/30 admission requiring BiPAP.  LINES / TUBES:  PIV  CULTURES:  Blood 1/30>>>  Sputum 1/30>>>  Urine 1/30>>>  ANTIBIOTICS:  Rocephin 1/30>>>  Zithromax 1/30>>>    SUBJECTIVE: pt much improved overnight.  Did not require bilevel.  On 2 liters with adequate sats and no increased wob at rest.   VITAL SIGNS:  Temp: [98 F (36.7 C)] 98 F (36.7 C) (01/30 1454)  Pulse Rate: [106-153] 106 (01/30 1715)  Resp: [15-40] 31 (01/30 1715)  BP: (97-141)/(43-84) 141/54 mmHg (01/30 1715)  SpO2: [93 %-100 %] 93 % (01/30 1715)  HEMODYNAMICS:   VENTILATOR SETTINGS:  BiPAP 8/5 with 40% FiO2.  INTAKE / OUTPUT:  Intake/Output  None   PHYSICAL EXAMINATION:  General: wd male in nad on nasal oxygen Neuro: Awake, alert and interactive, moving all ext to command.  HEENT: nose without purulence or d/c, neck without LN or TMG  Cardiovascular: RRR, no murmurs  Lungs: bibasilar crackles, no wheezing. Abdomen: Soft, NT, ND and +BS.  Musculoskeletal: no edema or cyanosis   Imaging  Dg Chest Port 1 View  05/24/2013 CLINICAL DATA: Productive cough, shortness of breath, history COPD, hypertension, smoking, CHF EXAM: PORTABLE CHEST - 1 VIEW COMPARISON: Portable exam 1528 hr compared to 07/15/2012 FINDINGS: Normal heart size  post median sternotomy. Atherosclerotic calcification aorta. Mediastinal contours and pulmonary vascularity normal. Emphysematous changes with hazy left lower lobe infiltrate. Remaining lungs clear. No pleural effusion or pneumothorax. IMPRESSION: Post CABG. Hazy left lower lobe infiltrate. Electronically Signed By: Lavonia Dana M.D. On: 05/24/2013 15:38     ASSESSMENT / PLAN:  PULMONARY  A: Respiratory failure, not a COPD exacerbation, likely flu vs CAP in a patient with ?baseline COPD. Unknown FEV1. ?if pulmonary edema is an active component here.  Pt is greatly improved from yesterday based on oxygenation and wob.    P:  - Treat CAP (rocephin and zithromax).  - Tamiflu daily.  - d/c bipap -slowly mobilize as tolerated.  -recheck cxr on Monday.  Will check again on Monday.  Please call if having worsening pulmonary issues.

## 2013-05-26 LAB — BASIC METABOLIC PANEL
BUN: 37 mg/dL — AB (ref 6–23)
CO2: 19 mEq/L (ref 19–32)
CREATININE: 1.72 mg/dL — AB (ref 0.50–1.35)
Calcium: 8.7 mg/dL (ref 8.4–10.5)
Chloride: 107 mEq/L (ref 96–112)
GFR, EST AFRICAN AMERICAN: 44 mL/min — AB (ref >90)
GFR, EST NON AFRICAN AMERICAN: 38 mL/min — AB (ref >90)
Glucose, Bld: 96 mg/dL (ref 70–99)
Potassium: 4.3 mEq/L (ref 3.7–5.3)
Sodium: 142 mEq/L (ref 137–147)

## 2013-05-26 LAB — URINE CULTURE
Colony Count: NO GROWTH
Culture: NO GROWTH

## 2013-05-26 LAB — PROCALCITONIN: Procalcitonin: 2.11 ng/mL

## 2013-05-26 MED ORDER — IPRATROPIUM BROMIDE 0.02 % IN SOLN: 0.5000 mg | RESPIRATORY_TRACT | Status: DC | PRN

## 2013-05-26 MED ORDER — LEVALBUTEROL HCL 0.63 MG/3ML IN NEBU: 0.6300 mg | INHALATION_SOLUTION | Freq: Four times a day (QID) | RESPIRATORY_TRACT | Status: DC | PRN

## 2013-05-26 MED ORDER — LEVALBUTEROL HCL 0.63 MG/3ML IN NEBU: 0.6300 mg | INHALATION_SOLUTION | Freq: Four times a day (QID) | RESPIRATORY_TRACT | Status: DC

## 2013-05-26 NOTE — Progress Notes (Addendum)
[facility] Progress Note [place] TEAM 1 - Stepdown/ICU TEAM   [name] KZS:[id number] DOB: 1941/02/08 DOA: 05/24/2013 PCP: Owens Loffler, MD  Brief narrative: 73 year old male with PMH of COPD and CAD presenting with 5 day history of SOB. Patient was in his regular state of health (not O2 dependent and no inhalers at home) until 5 days when he was in a crowd of people (which is unusual for him) then started coughing but was unable to produce any sputum. On the day PTP was febrile and with cold sweats. On day of presentation began having more SOB then finally decided to come to the ED. He was placed on BiPAP and admitted to the ICU service.   Subjective: Mild cough continues. Upset about being placed on Heparin s/c and having to take Protonix- is declining both despite adequate explanation.   Assessment/Plan: Principal Problem:   Acute respiratory failure / SIRS - CAP vs infleunza PNA - cont Rocephin and Azithromax - cont Tamiflu -wean O2 as able- IS, ambulate  Active Problems:    COPD (chronic obstructive pulmonary disease) - not on O2 at baseline -wean O2    CAD,   Ischemic cardiomyopathy - s/p CABG and 4 stents    Hypotension with h/o HTN (hypertension) - Norvasc and lisinopril on hold     ARF on CKD 3 - ATN- improving - d/c IVF today and increase PO intake     Paroxysmal a-fib: briefly in ED now in NSR - d/c'd  IVAmiodarone on 1/31 and followed in SDU over night- stable- will transfer to tele today - follows with [place] cardiology    HYPOTHYROIDISM NOS    HYPERCHOLESTEROLEMIA  Code Status: full code Family Communication: none Disposition Plan: likely home  Consultants: PCCM  Procedures: none  Antibiotics: Rocephin 1/30>>>  Zithromax 1/30>>>   DVT prophylaxis: Heparin  Objective: Blood pressure 125/61, pulse 84, temperature 98 F (36.7 C), temperature source Oral, resp. rate 30, height 5' 8.9" (1.75 m), weight 78.8 kg (173 lb 11.6  oz), SpO2 99.00%.  Intake/Output Summary (Last 24 hours) at 05/26/13 1106 Last data filed at 05/26/13 0800  Gross per 24 hour  Intake   1765 ml  Output    900 ml  Net    865 ml     Exam: General: No acute respiratory distress Lungs: CTA b/l - 2 L O2- 95% Cardiovascular: Regular rate and rhythm without murmur gallop or rub normal S1 and S2 Abdomen: Nontender, nondistended, soft, bowel sounds positive, no rebound, no ascites, no appreciable mass Extremities: No significant cyanosis, clubbing, or edema bilateral lower extremities  Data Reviewed: Basic Metabolic Panel:  Recent Labs Lab 05/24/13 1510 05/24/13 1631 05/24/13 2000 05/25/13 0305 05/26/13 0326  NA 135* 136* 139 138 142  K 5.0 4.5 4.5 4.1 4.3  CL 97 104 100 102 107  CO2 19  --  21 18* 19  GLUCOSE 114* 113* 131* 126* 96  BUN 37* 37* 40* 41* 37*  CREATININE 2.32* 2.30* 2.30* 2.22* 1.72*  CALCIUM 9.4  --  8.7 8.4 8.7  MG  --   --  2.1  --   --    Liver Function Tests:  Recent Labs Lab 05/24/13 1510 05/24/13 2000 05/25/13 0305  AST 36 34 32  ALT 23 22 22   ALKPHOS 80 80 64  BILITOT 0.9 0.6 0.5  PROT 7.6 7.1 6.3  ALBUMIN 3.3* 2.9* 2.7*   No results found for this basename: LIPASE, AMYLASE,  in  the last 168 hours No results found for this basename: AMMONIA,  in the last 168 hours CBC:  Recent Labs Lab 05/24/13 1510 05/24/13 1631 05/25/13 0305  WBC 9.7  --  7.4  NEUTROABS 6.2  --  4.6  HGB 15.8 16.0 13.2  HCT 46.9 47.0 39.1  MCV 89.3  --  87.9  PLT 213  --  201   Cardiac Enzymes:  Recent Labs Lab 05/24/13 2000 05/25/13 0114 05/25/13 0905  TROPONINI <0.30 <0.30 <0.30   BNP (last 3 results)  Recent Labs  07/16/12 1550 05/24/13 1615  PROBNP 9072.0* 1493.0*   CBG: No results found for this basename: GLUCAP,  in the last 168 hours  Recent Results (from the past 240 hour(s))  MRSA PCR SCREENING     Status: None   Collection Time    05/24/13  7:46 PM      Result Value Range Status    MRSA by PCR NEGATIVE  NEGATIVE Final   Comment:            The GeneXpert MRSA Assay (FDA     approved for NASAL specimens     only), is one component of a     comprehensive MRSA colonization     surveillance program. It is not     intended to diagnose MRSA     infection nor to guide or     monitor treatment for     MRSA infections.     Studies:  Recent x-ray studies have been reviewed in detail by the Attending Physician  Scheduled Meds:  Scheduled Meds: . aspirin EC  81 mg Oral Daily  . atorvastatin  20 mg Oral Daily  . azithromycin  500 mg Oral Daily  . carvedilol  3.125 mg Oral BID WC  . cefTRIAXone (ROCEPHIN)  IV  1 g Intravenous Q24H  . guaiFENesin  1,200 mg Oral BID  . ipratropium  0.5 mg Nebulization Q6H  . levalbuterol  0.63 mg Nebulization Q6H  . levothyroxine  200 mcg Oral QAC breakfast  . oseltamivir  30 mg Oral BID   Continuous Infusions: . sodium chloride 75 mL/hr at 05/25/13 1450    Time spent on care of this patient: 6 min   [place], MD  [facility] Office  [phone] Pager - Text Page per Shea Evans as per below:  On-Call/Text Page:      [email or web address]      password [email or web address]  If 7PM-7AM, please contact night-coverage [email or web address] Password [email or web address] 05/26/2013, 11:06 AM   LOS: 2 days

## 2013-05-27 ENCOUNTER — Telehealth: Payer: Self-pay | Admitting: Family Medicine

## 2013-05-27 LAB — BASIC METABOLIC PANEL
BUN: 24 mg/dL — AB (ref 6–23)
CO2: 22 mEq/L (ref 19–32)
CREATININE: 1.42 mg/dL — AB (ref 0.50–1.35)
Calcium: 8.7 mg/dL (ref 8.4–10.5)
Chloride: 104 mEq/L (ref 96–112)
GFR, EST AFRICAN AMERICAN: 56 mL/min — AB (ref >90)
GFR, EST NON AFRICAN AMERICAN: 48 mL/min — AB (ref >90)
GLUCOSE: 101 mg/dL — AB (ref 70–99)
Potassium: 4.1 mEq/L (ref 3.7–5.3)
Sodium: 141 mEq/L (ref 137–147)

## 2013-05-27 MED ORDER — ACETAMINOPHEN 325 MG PO TABS
650.0000 mg | ORAL_TABLET | Freq: Four times a day (QID) | ORAL | Status: DC | PRN
  Administered 2013-05-27: 650 mg via ORAL
  Filled 2013-05-27: qty 2

## 2013-05-27 MED ORDER — FUROSEMIDE 10 MG/ML IJ SOLN
20.0000 mg | Freq: Once | INTRAMUSCULAR | Status: AC
  Administered 2013-05-27: 20 mg via INTRAVENOUS
  Filled 2013-05-27: qty 2

## 2013-05-27 NOTE — Evaluation (Signed)
Occupational Therapy Evaluation Patient Details Name: [name] MRN: [id number] DOB: 03/03/1941 Today's Date: 05/27/2013 Time: 2376-2831 OT Time Calculation (min): 30 min  OT Assessment / Plan / Recommendation History of present illness Pt admitted with SOB. Pt with extensive cardiac history   Clinical Impression   Pt presents w/ dx as above and is impacted by his ability to perform ADL's and functional mobility secondary to SOB and deconditioning. Note Pt O2 sats decreased to 84% on RA w/ transfer to bathroom today. Added extension to O2 line so pt can wear in room/bathroom. O2 returned to 94% <30 seconds of reapplying O2 via nasal cannula @ 2L. Pt will benefit from acute OT to address problems as outlined.    OT Assessment  Patient needs continued OT Services    Follow Up Recommendations  No OT follow up;Supervision/Assistance - 24 hour    Barriers to Discharge      Equipment Recommendations  Other (comment) (Pt states no needs)    Recommendations for Other Services    Frequency  Min 3X/week    Precautions / Restrictions Precautions Precaution Comments: watch O2 saturation Restrictions Weight Bearing Restrictions: No   Pertinent Vitals/Pain Pt c/o "All over pain" RN aware and gave pt tylenol during session, pt did not rate pain "I just don't feel great"    ADL  Eating/Feeding: Performed;Independent Where Assessed - Eating/Feeding: Edge of bed Grooming: Performed;Wash/dry hands;Supervision/safety;Other (comment) (Monitor O2, decreased O2 to 84% on RA, O2 extension added for bathroom transfers) Where Assessed - Grooming: Unsupported standing Upper Body Bathing: Simulated;Supervision/safety Where Assessed - Upper Body Bathing: Unsupported sitting Lower Body Bathing: Simulated;Supervision/safety;Other (comment) (VC's for energy conservation) Where Assessed - Lower Body Bathing: Unsupported sitting Upper Body Dressing: Simulated;Supervision/safety;Modified  independent Where Assessed - Upper Body Dressing: Unsupported sitting Lower Body Dressing: Performed;Supervision/safety;Other (comment) (Vc's for energy conservation/rest breaks PRN) Where Assessed - Lower Body Dressing: Unsupported sitting;Unsupported sit to stand Toilet Transfer: Performed;Supervision/safety;Modified independent (O2 decreased to 84% on RA when pt ambulated to bathroom to use toilet. Returned to 94% on 3L O2.) Toilet Transfer Method: Sit to Loss adjuster, chartered: Comfort height toilet Toileting - Water quality scientist and Hygiene: Performed;Supervision/safety;Modified independent Where Assessed - Best boy and Hygiene: Sit to stand from 3-in-1 or toilet Tub/Shower Transfer Method: Not assessed Equipment Used: Other (comment) (Pt ambulated to bathroom w/o O2 and O2 sats decreased to 84%, added extension to O2 line so pt can ambulate to bathroom w/ O2 on. ) Transfers/Ambulation Related to ADLs: Pt is overall Mod I-supervision for functional mobility and transfers secondary to decreased O2 Sats to 84% w/ transfer to bathroom on RA.  ADL Comments: Pt was educated in role of OT and need for endurance, ADL and energy conservation techniques. Pt initially denies need for OT stating "I can do all that, it's the breathing that is the problem" Pt educated that techniques as described should assist in increasing endurance and assist in return PLOF as pt medically able. Pt verbalized understanding of this after much discussion. Pt was noted to have decreased O2 sats to 84% on room air with bathroom transfer, added O2 extension line so pt can leave O2 on w/ toileting and grooming. Pt O2 retruned to 94% within <30 of being reapplied.     OT Diagnosis: Generalized weakness;Other (comment) (SOB w/ activity)  OT Problem List: Decreased activity tolerance;Decreased knowledge of precautions;Impaired balance (sitting and/or standing);Decreased knowledge of use of DME or  AE;Cardiopulmonary status limiting activity OT Treatment Interventions: Self-care/ADL training;Energy conservation;DME  and/or AE instruction;Patient/family education;Therapeutic activities   OT Goals(Current goals can be found in the care plan section) Acute Rehab OT Goals Patient Stated Goal: "I don't want to go home on oxygen. I want to find out what's going on and why no one is doing anything" OT Goal Formulation: With patient Time For Goal Achievement: 06/10/13 Potential to Achieve Goals: Good  Visit Information  Last OT Received On: 05/27/13 Assistance Needed: +1 History of Present Illness: Pt admitted with SOB. Pt with extensive cardiac history       Prior Functioning     Home Living Family/patient expects to be discharged to:: Private residence Living Arrangements: Spouse/significant other Available Help at Discharge: Family;Available 24 hours/day Type of Home: House Home Access: Stairs to enter [employer] of Steps: 3 Entrance Stairs-Rails: Right;Left Home Layout: One level Home Equipment: Walker - 2 wheels;Shower seat Prior Function Level of Independence: Independent Communication Communication: No difficulties Dominant Hand: Left    Vision/Perception Vision - History Baseline Vision: Wears glasses all the time Patient Visual Report: No change from baseline   Cognition  Cognition Arousal/Alertness: Awake/alert Behavior During Therapy: WFL for tasks assessed/performed Overall Cognitive Status: Within Functional Limits for tasks assessed    Extremity/Trunk Assessment Upper Extremity Assessment Upper Extremity Assessment: Overall WFL for tasks assessed Lower Extremity Assessment Lower Extremity Assessment: Overall WFL for tasks assessed Cervical / Trunk Assessment Cervical / Trunk Assessment: Normal    Mobility Bed Mobility Overal bed mobility:  (Pt amb to bathroom and O2 dropped to 84% on RA, reapplied O2 w/ extension line and O2 returned to 94%  <30 secondas of being reapplied. Pt on 2 L via nasal cannula. ) General bed mobility comments: No O2 in bathroom, pt SOB and reapplied and O2 94% Transfers Overall transfer level: Needs assistance Equipment used: None Transfers: Sit to/from Stand Sit to Stand: Supervision;Modified independent (Device/Increase time) General transfer comment:  Pt amb to bathroom and O2 dropped to 84% on RA, reapplied O2 w/ extension line and O2 returned to 94% <30 secondas of being reapplied. Pt on 2 L via nasal cannula.         Balance Balance Overall balance assessment: Needs assistance Sitting-balance support: Feet supported;Single extremity supported Sitting balance-Leahy Scale: Good Sitting balance - Comments: Sitting EOB to eat breakfast today Standing balance support: No upper extremity supported Standing balance-Leahy Scale: Fair   End of Session OT - End of Session Equipment Utilized During Treatment: Oxygen Activity Tolerance: Patient limited by fatigue Patient left: in bed;Other (comment);with call bell/phone within reach (Sitting EOB) Nurse Communication: Mobility status;Other (comment) (Pt expressed frustration w/ "Not knowing what's going on" Pt RN and OT reviewed pt care thus far and deferred to MD for specific questions. RN aware and addressing.)  GO     Almyra Deforest 05/27/2013, 9:07 AM

## 2013-05-27 NOTE — Telephone Encounter (Signed)
Relevant patient education assigned to patient using Emmi. ° °

## 2013-05-27 NOTE — Clinical Documentation Improvement (Signed)
Possible Clinical Conditions?  Chronic Systolic Congestive Heart Failure Chronic Diastolic Congestive Heart Failure Chronic Systolic & Diastolic Congestive Heart Failure Acute Systolic Congestive Heart Failure Acute Diastolic Congestive Heart Failure Acute Systolic & Diastolic Congestive Heart Failure Acute on Chronic Systolic Congestive Heart Failure Acute on Chronic Diastolic Congestive Heart Failure Acute on Chronic Systolic & Diastolic Congestive Heart Failure Other Condition Cannot Clinically Determine   Risk Factors: Patient with a history of CHF, per 01/30 progress notes.  Diagnostics: 01/30: proBNP: 1493.0   Thank You, Theron Arista, Clinical Documentation Specialist:  [facility] [employer] [facility]

## 2013-05-27 NOTE — Progress Notes (Signed)
Physical Therapy Treatment Patient Details Name: [name] MRN: [id number] DOB: December 12, 1940 Today's Date: 05/27/2013 Time: 3664-4034 PT Time Calculation (min): 24 min  PT Assessment / Plan / Recommendation  History of Present Illness Pt admitted with SOB. Pt with extensive cardiac history   PT Comments   Patient making improvement with gait today.  Follow Up Recommendations  No PT follow up;Supervision/Assistance - 24 hour     Does the patient have the potential to tolerate intense rehabilitation     Barriers to Discharge        Equipment Recommendations  None recommended by PT    Recommendations for Other Services    Frequency Min 3X/week   Progress towards PT Goals Progress towards PT goals: Progressing toward goals  Plan Current plan remains appropriate    Precautions / Restrictions Precautions Precautions: None Precaution Comments: watch O2 saturation Restrictions Weight Bearing Restrictions: No   Pertinent Vitals/Pain O2 sat at 98% with ambulation on O2 at 3 l/min.    Mobility  Transfers Overall transfer level: Needs assistance Equipment used: Rolling walker (2 wheeled) Transfers: Sit to/from Stand Sit to Stand: Supervision General transfer comment: Patient able to stand from chair using armrests with supervision only for safety. Ambulation/Gait Ambulation/Gait assistance: Supervision Ambulation Distance (Feet): 335 Feet Assistive device: Rolling walker (2 wheeled) Gait Pattern/deviations: Step-through pattern;Decreased stride length;Trunk flexed Gait velocity: WFL General Gait Details: Patient demonstrated safe use of RW.  Good gait pattern, balance, and speed.  Dyspnea 2/4 with gait.  Patient on 3.5 l/min O2.  Ambulated with 3 l/min with O2 sats remaining at 98%.      PT Goals (current goals can now be found in the care plan section)    Visit Information  Last PT Received On: 05/27/13 Assistance Needed: +1 History of Present Illness: Pt admitted  with SOB. Pt with extensive cardiac history    Subjective Data  Subjective: "I'm a little cold today"  " I had some trouble this morning.  They bumped my oxygen up"   Cognition  Cognition Arousal/Alertness: Awake/alert Behavior During Therapy: WFL for tasks assessed/performed Overall Cognitive Status: Within Functional Limits for tasks assessed    Balance     End of Session PT - End of Session Equipment Utilized During Treatment: Gait belt;Oxygen Activity Tolerance: Patient limited by fatigue Patient left: in chair;with call bell/phone within reach;with family/visitor present Nurse Communication: Mobility status   GP     Despina Pole 05/27/2013, 5:08 PM Carita Pian. Sanjuana Kava, [place] Pager [phone]

## 2013-05-27 NOTE — Progress Notes (Signed)
[facility] Progress Note    MAGGIE DWORKIN RCV:[id number] DOB: [birth year] DOA: 05/24/2013 PCP: Owens Loffler, MD    Brief narrative:  73 year old male with PMH of COPD and CAD presenting with 5 day history of SOB. Patient was in his regular state of health (not O2 dependent and no inhalers at home) until 5 days when he was in a crowd of people (which is unusual for him) then started coughing but was unable to produce any sputum. On the day PTP was febrile and with cold sweats. On day of presentation began having more SOB then finally decided to come to the ED. He was placed on BiPAP and admitted to the ICU service.    Subjective:  Patient sitting in bed, mild shortness of breath, no fever chills, mild dry cough. Denies any chest or abdominal pain. No new focal weakness.    As per the note from team one or 05/26/2013 he was Upset about being placed on Heparin s/c and having to take Protonix- is declining both despite adequate explanation.     Assessment/Plan:    Acute respiratory failure / SIRS  - CAP vs URI, he is influenza positive stop Tamiflu, afebrile with much improved cough, stop Rocephin continue azithromycin.  Today clinically looks more like acute on chronic diastolic CHF EF 10% on recent echo, we'll restrict fluid, sort intake and give him a trial of IV Lasix.        COPD (chronic obstructive pulmonary disease) - not on O2 at baseline -wean O2      CAD,   Ischemic cardiomyopathy - s/p CABG and 4 stents      Hypotension with h/o HTN (hypertension) - Norvasc and lisinopril on hold       ARF on CKD 3 baseline creatinine around 1.5 IV fluids have been stopped, gentle Lasix x1, creatinine at baseline.      Paroxysmal a-fib: briefly in ED now in NSR  Case D/W Dr Jeffie Pollock by EDP and again by follows with [facility] cardiology, no further recommendations in the inpatient setting. Will have him follow with his primary cardiologist Dr. Fletcher Anon for  outpatient event monitor. Continue on aspirin for now     Moderate aortic stenosis.  No acute issues, we'll try to avoid large drop in preload, outpatient follow with primary cardiologist post discharge      HYPOTHYROIDISM NOS on home dose Synthroid   Lab Results  Component Value Date   TSH 0.459 05/24/2013     HYPERCHOLESTEROLEMIA on home dose statin      Code Status: full code Family Communication: none Disposition Plan: likely home   Consultants: PCCM   Procedures: none   Antibiotics: Rocephin 1/30>>>  Zithromax 1/30>>>    DVT prophylaxis: Heparin   Objective: Blood pressure 133/63, pulse 89, temperature 99.5 F (37.5 C), temperature source Oral, resp. rate 18, height 5' 8.9" (1.75 m), weight 78.8 kg (173 lb 11.6 oz), SpO2 94.00%.  Intake/Output Summary (Last 24 hours) at 05/27/13 1126 Last data filed at 05/27/13 0700  Gross per 24 hour  Intake    120 ml  Output    650 ml  Net   -530 ml     Exam: General: No acute respiratory distress Neuro : AAOx 3 Lungs: bilat rales Cardiovascular: Regular rate and rhythm without murmur gallop or rub normal S1 and S2 Abdomen: Nontender, nondistended, soft, bowel sounds positive, no rebound, no ascites, no appreciable mass Extremities: No significant cyanosis, clubbing, , trace edema bilateral lower  extremities   Data Reviewed:   Basic Metabolic Panel:  Recent Labs Lab 05/24/13 1510 05/24/13 1631 05/24/13 2000 05/25/13 0305 05/26/13 0326 05/27/13 0525  NA 135* 136* 139 138 142 141  K 5.0 4.5 4.5 4.1 4.3 4.1  CL 97 104 100 102 107 104  CO2 19  --  21 18* 19 22  GLUCOSE 114* 113* 131* 126* 96 101*  BUN 37* 37* 40* 41* 37* 24*  CREATININE 2.32* 2.30* 2.30* 2.22* 1.72* 1.42*  CALCIUM 9.4  --  8.7 8.4 8.7 8.7  MG  --   --  2.1  --   --   --    Liver Function Tests:  Recent Labs Lab 05/24/13 1510 05/24/13 2000 05/25/13 0305  AST 36 34 32  ALT 23 22 22   ALKPHOS 80 80 64  BILITOT 0.9  0.6 0.5  PROT 7.6 7.1 6.3  ALBUMIN 3.3* 2.9* 2.7*   No results found for this basename: LIPASE, AMYLASE,  in the last 168 hours No results found for this basename: AMMONIA,  in the last 168 hours CBC:  Recent Labs Lab 05/24/13 1510 05/24/13 1631 05/25/13 0305  WBC 9.7  --  7.4  NEUTROABS 6.2  --  4.6  HGB 15.8 16.0 13.2  HCT 46.9 47.0 39.1  MCV 89.3  --  87.9  PLT 213  --  201   Cardiac Enzymes:  Recent Labs Lab 05/24/13 2000 05/25/13 0114 05/25/13 0905  TROPONINI <0.30 <0.30 <0.30   BNP (last 3 results)  Recent Labs  07/16/12 1550 05/24/13 1615  PROBNP 9072.0* 1493.0*   CBG: No results found for this basename: GLUCAP,  in the last 168 hours  Recent Results (from the past 240 hour(s))  CULTURE, BLOOD (ROUTINE X 2)     Status: None   Collection Time    05/24/13  4:00 PM      Result Value Range Status   Specimen Description BLOOD RIGHT ARM   Final   Special Requests BOTTLES DRAWN AEROBIC AND ANAEROBIC 10CC   Final   Culture  Setup Time     Final   Value: 05/25/2013 00:41     Performed at [facility]   Culture     Final   Value:        BLOOD CULTURE RECEIVED NO GROWTH TO DATE CULTURE WILL BE HELD FOR 5 DAYS BEFORE ISSUING A FINAL NEGATIVE REPORT     Performed at [facility]   Report Status PENDING   Incomplete  CULTURE, BLOOD (ROUTINE X 2)     Status: None   Collection Time    05/24/13  4:15 PM      Result Value Range Status   Specimen Description BLOOD ARM RIGHT   Final   Special Requests BOTTLES DRAWN AEROBIC AND ANAEROBIC 10CC   Final   Culture  Setup Time     Final   Value: 05/25/2013 00:41     Performed at [facility]   Culture     Final   Value:        BLOOD CULTURE RECEIVED NO GROWTH TO DATE CULTURE WILL BE HELD FOR 5 DAYS BEFORE ISSUING A FINAL NEGATIVE REPORT     Performed at [facility]   Report Status PENDING   Incomplete  MRSA PCR SCREENING     Status: None   Collection Time    05/24/13  7:46 PM       Result Value Range Status  MRSA by PCR NEGATIVE  NEGATIVE Final   Comment:            The GeneXpert MRSA Assay (FDA     approved for NASAL specimens     only), is one component of a     comprehensive MRSA colonization     surveillance program. It is not     intended to diagnose MRSA     infection nor to guide or     monitor treatment for     MRSA infections.  URINE CULTURE     Status: None   Collection Time    05/24/13  9:41 PM      Result Value Range Status   Specimen Description URINE, CLEAN CATCH   Final   Special Requests NONE   Final   Culture  Setup Time     Final   Value: 05/25/2013 07:38     Performed at [facility]     Final   Value: NO GROWTH     Performed at [facility]   Culture     Final   Value: NO GROWTH     Performed at [facility]   Report Status 05/26/2013 FINAL   Final     Studies:  Recent x-ray studies have been reviewed in detail by the Attending Physician  Scheduled Meds:  Scheduled Meds: . aspirin EC  81 mg Oral Daily  . atorvastatin  20 mg Oral Daily  . azithromycin  500 mg Oral Daily  . carvedilol  3.125 mg Oral BID WC  . guaiFENesin  1,200 mg Oral BID  . levothyroxine  200 mcg Oral QAC breakfast   Continuous Infusions:    Time spent on care of this patient: 35 min   Thurnell Lose, MD  [facility] Office  [phone] Pager - Text Page per Shea Evans as per below:   If 7PM-7AM, please contact night-coverage [email or web address] Password [email or web address] 05/27/2013, 11:26 AM   LOS: 3 days

## 2013-05-27 NOTE — Progress Notes (Signed)
Name: [name]  MRN: [id number]  DOB: 1941/02/26   ADMISSION DATE: 05/24/2013  CONSULTATION DATE: 05/24/2013   REFERRING MD : Dr. Grandville Silos   CHIEF COMPLAINT: Respiratory failure   BRIEF PATIENT DESCRIPTION:  73 yo male admitted with progressive dyspnea, fever and cough.  Hx of COPD, CAD.  ANTIBIOTICS:  Rocephin 1/30>>>  Zithromax 1/30>>> 2/01  SUBJECTIVE: Resting comfortably.  VITAL SIGNS:  Temp: [98 F (36.7 C)] 98 F (36.7 C) (01/30 1454)  Pulse Rate: [106-153] 106 (01/30 1715)  Resp: [15-40] 31 (01/30 1715)  BP: (97-141)/(43-84) 141/54 mmHg (01/30 1715)  SpO2: [93 %-100 %] 93 % (01/30 1715)  Room air  PHYSICAL EXAMINATION:  General: no distress Neuro: normal strength  HEENT: no sinus tenderness Cardiovascular: regular Lungs: no wheeze Abdomen: Soft Musculoskeletal: ankle edema  CBC Recent Labs     05/24/13  1510  05/24/13  1631  05/25/13  0305  WBC  9.7   --   7.4  HGB  15.8  16.0  13.2  HCT  46.9  47.0  39.1  PLT  213   --   201    Coag's Recent Labs     05/24/13  2000  APTT  39*  INR  1.43    BMET Recent Labs     05/25/13  0305  05/26/13  0326  05/27/13  0525  NA  138  142  141  K  4.1  4.3  4.1  CL  102  107  104  CO2  18*  19  22  BUN  41*  37*  24*  CREATININE  2.22*  1.72*  1.42*  GLUCOSE  126*  96  101*    Electrolytes Recent Labs     05/24/13  2000  05/25/13  0305  05/26/13  0326  05/27/13  0525  CALCIUM  8.7  8.4  8.7  8.7  MG  2.1   --    --    --     Sepsis Markers Recent Labs     05/24/13  2000  05/25/13  0305  05/26/13  0326  PROCALCITON  4.03  4.05  2.11    ABG Recent Labs     05/24/13  1633  PHART  7.390  PCO2ART  30.6*  PO2ART  135.0*    Liver Enzymes Recent Labs     05/24/13  1510  05/24/13  2000  05/25/13  0305  AST  36  34  32  ALT  23  22  22   ALKPHOS  80  80  64  BILITOT  0.9  0.6  0.5  ALBUMIN  3.3*  2.9*  2.7*    Cardiac Enzymes Recent Labs     05/24/13  1615  05/24/13  2000  05/25/13  0114  05/25/13  0905  TROPONINI   --   <0.30  <0.30  <0.30  PROBNP  1493.0*   --    --    --    Imaging No results found.   ASSESSMENT / PLAN:  A:  Acute respiratory failure 2nd to CAP in setting of COPD. P:  -Abx per primary team -f/u CXR as needed >> should get f/u CXR as outpt in several weeks -oxygen as needed to keep SpO2 > 92% -prn BD's  Previously seen by pulmonary in [place] by Dr. [place] Bells >> pt's wife states she would like to f/u with PCP and then re-arrange for pulmonary follow up as outpt if needed.  PCCM will sign off.  Please call if additional help needed.  Chesley Mires, MD [facility] Pulmonary/Critical Care 05/27/2013, 11:49 AM Pager:  [phone] After 3pm call: [phone]

## 2013-05-28 ENCOUNTER — Inpatient Hospital Stay (HOSPITAL_COMMUNITY): Payer: Medicare Other

## 2013-05-28 LAB — POTASSIUM: Potassium: 4.1 mEq/L (ref 3.7–5.3)

## 2013-05-28 MED ORDER — FUROSEMIDE 10 MG/ML IJ SOLN
40.0000 mg | Freq: Once | INTRAMUSCULAR | Status: AC
  Administered 2013-05-28: 40 mg via INTRAVENOUS
  Filled 2013-05-28: qty 4

## 2013-05-28 NOTE — Care Management Note (Signed)
    Page 1 of 1   05/29/2013     2:52:04 PM   CARE MANAGEMENT NOTE 05/29/2013  Patient:  [name], [name]   Account Number:  [id number]  Date Initiated:  05/28/2013  Documentation initiated by:  Zymire Turnbo  Subjective/Objective Assessment:   PT ADM ON 1/30 WITH ACUTE RESP FAILURE/SIRS.  PTA, PT INDEPENDENT, LIVES WITH SPOUSE.     Action/Plan:   PT/OT HAVE EVALUATED AND PT NEEDS NO HOME FOLLOW UP.  WILL CONT TO FOLLOW FOR DC NEEDS.   Anticipated DC Date:  05/29/2013   Anticipated DC Plan:  [place]  CM consult      Choice offered to / List presented to:          [facility] arranged  [place] - 11 Patient Refused      Status of service:  Completed, signed off Medicare Important Message given?   (If response is "NO", the following Medicare IM given date fields will be blank) Date Medicare IM given:   Date Additional Medicare IM given:    Discharge Disposition:  HOME/SELF CARE  Per UR Regulation:  Reviewed for med. necessity/level of care/duration of stay  If discussed at [place] of Stay Meetings, dates discussed:    Comments:  05/29/13 Caroline Longie,RN,BSN [phone] [place] UP, AS ORDERED BY MD.  PT/WIFE REFUSED FOLLOW UP.

## 2013-05-28 NOTE — Progress Notes (Addendum)
Occupational Therapy Treatment Patient Details Name: [name] MRN: [id number] DOB: 12/19/1940 Today's Date: 05/28/2013 Time: 8299-3716 OT Time Calculation (min): 24 min  OT Assessment / Plan / Recommendation  History of present illness Pt admitted with SOB. Pt with extensive cardiac history   OT comments  Agreeable to participation but non-compliant with o2 use during functional tasks.  Reviewed with pt. At length and he states he does not see the benefits of wearing it.  Reviewed benefits, and also discussed possible outcomes/side effects of not wearing it.  Pt. Con't. To refuse to put on during grooming tasks at sink.  Family member present and did not assist with having pt. Wear.  However, physically he is able to manage all ADL goals set. Will discuss with evaluating therapist rec. For d/c since goals met.  Follow Up Recommendations  No OT follow up;Supervision/Assistance - 24 hour                      Frequency Min 3X/week   Progress towards OT Goals Progress towards OT goals: Goals met/education completed, patient discharged from [place] Discharge plan remains appropriate    Precautions / Restrictions Precautions Precautions: None Precaution Comments: watch O2 saturation   Pertinent Vitals/Pain Denies pain, just c/o being cold and "feeling like crap all over" (secondary to reports of congestion)    ADL  Grooming: Performed;Wash/dry hands;Wash/dry face;Denture care;Supervision/safety Where Assessed - Grooming: Unsupported standing Toilet Transfer: Performed;Supervision/safety;Other (comment) (int. cues for o2 tube management) Toilet Transfer Method: Sit to stand Toilet Transfer Equipment: Comfort height toilet;Grab bars Toileting - Clothing Manipulation and Hygiene: Supervision/safety Tub/Shower Transfer Method: Therapist, art: Grab bars;Walk in shower Transfers/Ambulation Related to ADLs: mostly s/mod I for functional mobility/amb., some  intermittent cues for o2 tube management ADL Comments: ablet to complete grooming in standing, all aspects of toileting, and shower stall transfer with s/mod. I. non-compliant with wearing o2 while at sink even after he washed his face.  "i realize you are trying to help but honsetly the o2 isnt doing anything to make me feel better." reviewed benefits of using itt, and possible consequences of not wearing it.  pt. explanined he understood and apologized for any of his attitude or nature but says "im usually worse than this"     OT Goals(current goals can now be found in the care plan section)    Visit Information  Last OT Received On: 05/28/13 History of Present Illness: Pt admitted with SOB. Pt with extensive cardiac history    Subjective Data   "i know i am being difficult, but honestly i am usually worse"          Cognition  Cognition Arousal/Alertness: Awake/alert Behavior During Therapy: WFL for tasks assessed/performed Overall Cognitive Status: Within Functional Limits for tasks assessed    Mobility  Transfers Overall transfer level: Needs assistance Equipment used: Rolling walker (2 wheeled) Transfers: Sit to/from Stand General transfer comment: Patient able to stand from chair using armrests with supervision only for safety.              End of Session OT - End of Session Equipment Utilized During Treatment: Oxygen Activity Tolerance: Patient tolerated treatment well Patient left: in chair;with call bell/phone within reach       Janice Coffin, COTA/L 05/28/2013, 12:41 PM

## 2013-05-28 NOTE — Progress Notes (Signed)
[facility] Progress Note    [name] GGY:[id number] DOB: 04-Dec-1940 DOA: 05/24/2013 PCP: Owens Loffler, MD    Brief narrative:  73 year old male with PMH of COPD and CAD presenting with 5 day history of SOB. Patient was in his regular state of health (not O2 dependent and no inhalers at home) until 5 days when he was in a crowd of people (which is unusual for him) then started coughing but was unable to produce any sputum. On the day PTP was febrile and with cold sweats. On day of presentation began having more SOB then finally decided to come to the ED. He was placed on BiPAP and admitted to the ICU service.  He was transferred to the floor once he came off of BiPAP, in my opinion he had mild URI and there is suspicion of acute on chronic diastolic CHF. I have started him on Lasix with good results. Still requiring oxygen still has edema and shortness of breath.    Subjective:  Patient sitting in bed, mild shortness of breath had orthopnea last night, no fever chills, mild dry cough. Denies any chest or abdominal pain. No new focal weakness.    As per the note from team one or 05/26/2013 he was Upset about being placed on Heparin s/c and having to take Protonix- is declining both despite adequate explanation.     Assessment/Plan:    Acute respiratory failure / SIRS  - CAP vs URI, he is influenza positive stop Tamiflu, afebrile with much improved cough, stop Rocephin continue azithromycin.  - Clinically looks more like acute on chronic diastolic CHF EF 03% on recent echo, we'll restrict fluid - salt intake, improved with diuresis we'll continue Lasix today IV, recheck BMP in the morning. Repeat 2 view chest x-ray. Is clinically better. Encouraged to increase activity. We'll try to titrate down oxygen.        COPD (chronic obstructive pulmonary disease) - not on O2 at baseline, wean O2      CAD,   Ischemic cardiomyopathy - s/p CABG and 4 stents       Hypotension with h/o HTN (hypertension) - Pressure better we'll resume Norvasc, continue to hold lisinopril.       ARF on CKD 3 baseline creatinine around 1.5 IV fluids have been stopped, gentle Lasix , creatinine at baseline, repeat BMP in the morning.      Paroxysmal a-fib: briefly in ED now in NSR  Case D/W Dr Jeffie Pollock by EDP and again by follows with [facility] cardiology, no further recommendations in the inpatient setting. Will have him follow with his primary cardiologist Dr. Fletcher Anon for outpatient event monitor. Continue on aspirin for now     Moderate aortic stenosis.  No acute issues, we'll try to avoid large drop in preload, outpatient follow with primary cardiologist post discharge      HYPOTHYROIDISM NOS on home dose Synthroid   Lab Results  Component Value Date   TSH 0.459 05/24/2013     HYPERCHOLESTEROLEMIA on home dose statin      Code Status: full code Family Communication: none Disposition Plan: likely home   Consultants: PCCM   Procedures: none   Antibiotics: Rocephin 1/30>>>  Zithromax 1/30>>>    DVT prophylaxis: Heparin   Objective: Blood pressure 152/64, pulse 92, temperature 98.2 F (36.8 C), temperature source Oral, resp. rate 18, height 5' 8.9" (1.75 m), weight 78.8 kg (173 lb 11.6 oz), SpO2 97.00%.  Intake/Output Summary (Last 24 hours) at 05/28/13 [phone]  Last data filed at 05/28/13 0740  Gross per 24 hour  Intake    480 ml  Output   1350 ml  Net   -870 ml     Exam: General: No acute respiratory distress Neuro : AAOx 3 Lungs: bilat rales Cardiovascular: Regular rate and rhythm without murmur gallop or rub normal S1 and S2 Abdomen: Nontender, nondistended, soft, bowel sounds positive, no rebound, no ascites, no appreciable mass Extremities: No significant cyanosis, clubbing, , trace edema bilateral lower extremities   Data Reviewed:   Basic Metabolic Panel:  Recent Labs Lab 05/24/13 1510 05/24/13 1631  05/24/13 2000 05/25/13 0305 05/26/13 0326 05/27/13 0525 05/28/13 0406  NA 135* 136* 139 138 142 141  --   K 5.0 4.5 4.5 4.1 4.3 4.1 4.1  CL 97 104 100 102 107 104  --   CO2 19  --  21 18* 19 22  --   GLUCOSE 114* 113* 131* 126* 96 101*  --   BUN 37* 37* 40* 41* 37* 24*  --   CREATININE 2.32* 2.30* 2.30* 2.22* 1.72* 1.42*  --   CALCIUM 9.4  --  8.7 8.4 8.7 8.7  --   MG  --   --  2.1  --   --   --   --    Liver Function Tests:  Recent Labs Lab 05/24/13 1510 05/24/13 2000 05/25/13 0305  AST 36 34 32  ALT 23 22 22   ALKPHOS 80 80 64  BILITOT 0.9 0.6 0.5  PROT 7.6 7.1 6.3  ALBUMIN 3.3* 2.9* 2.7*   No results found for this basename: LIPASE, AMYLASE,  in the last 168 hours No results found for this basename: AMMONIA,  in the last 168 hours CBC:  Recent Labs Lab 05/24/13 1510 05/24/13 1631 05/25/13 0305  WBC 9.7  --  7.4  NEUTROABS 6.2  --  4.6  HGB 15.8 16.0 13.2  HCT 46.9 47.0 39.1  MCV 89.3  --  87.9  PLT 213  --  201   Cardiac Enzymes:  Recent Labs Lab 05/24/13 2000 05/25/13 0114 05/25/13 0905  TROPONINI <0.30 <0.30 <0.30   BNP (last 3 results)  Recent Labs  07/16/12 1550 05/24/13 1615  PROBNP 9072.0* 1493.0*   CBG: No results found for this basename: GLUCAP,  in the last 168 hours  Recent Results (from the past 240 hour(s))  CULTURE, BLOOD (ROUTINE X 2)     Status: None   Collection Time    05/24/13  4:00 PM      Result Value Range Status   Specimen Description BLOOD RIGHT ARM   Final   Special Requests BOTTLES DRAWN AEROBIC AND ANAEROBIC 10CC   Final   Culture  Setup Time     Final   Value: 05/25/2013 00:41     Performed at [facility]   Culture     Final   Value:        BLOOD CULTURE RECEIVED NO GROWTH TO DATE CULTURE WILL BE HELD FOR 5 DAYS BEFORE ISSUING A FINAL NEGATIVE REPORT     Performed at [facility]   Report Status PENDING   Incomplete  CULTURE, BLOOD (ROUTINE X 2)     Status: None   Collection Time     05/24/13  4:15 PM      Result Value Range Status   Specimen Description BLOOD ARM RIGHT   Final   Special Requests BOTTLES DRAWN AEROBIC AND ANAEROBIC 10CC   Final  Culture  Setup Time     Final   Value: 05/25/2013 00:41     Performed at [facility]   Culture     Final   Value:        BLOOD CULTURE RECEIVED NO GROWTH TO DATE CULTURE WILL BE HELD FOR 5 DAYS BEFORE ISSUING A FINAL NEGATIVE REPORT     Performed at [facility]   Report Status PENDING   Incomplete  MRSA PCR SCREENING     Status: None   Collection Time    05/24/13  7:46 PM      Result Value Range Status   MRSA by PCR NEGATIVE  NEGATIVE Final   Comment:            The GeneXpert MRSA Assay (FDA     approved for NASAL specimens     only), is one component of a     comprehensive MRSA colonization     surveillance program. It is not     intended to diagnose MRSA     infection nor to guide or     monitor treatment for     MRSA infections.  URINE CULTURE     Status: None   Collection Time    05/24/13  9:41 PM      Result Value Range Status   Specimen Description URINE, CLEAN CATCH   Final   Special Requests NONE   Final   Culture  Setup Time     Final   Value: 05/25/2013 07:38     Performed at [facility]     Final   Value: NO GROWTH     Performed at [facility]   Culture     Final   Value: NO GROWTH     Performed at [facility]   Report Status 05/26/2013 FINAL   Final     Studies:  Recent x-ray studies have been reviewed in detail by the Attending Physician  Scheduled Meds:  Scheduled Meds: . aspirin EC  81 mg Oral Daily  . atorvastatin  20 mg Oral Daily  . azithromycin  500 mg Oral Daily  . carvedilol  3.125 mg Oral BID WC  . furosemide  40 mg Intravenous Once  . guaiFENesin  1,200 mg Oral BID  . levothyroxine  200 mcg Oral QAC breakfast   Continuous Infusions:    Time spent on care of this patient: 35 min   Thurnell Lose,  MD  [facility] Office  [phone] Pager - Text Page per Shea Evans as per below:   If 7PM-7AM, please contact night-coverage [email or web address] Password [email or web address] 05/28/2013, 9:07 AM   LOS: 4 days

## 2013-05-29 LAB — BASIC METABOLIC PANEL
BUN: 21 mg/dL (ref 6–23)
CALCIUM: 8.8 mg/dL (ref 8.4–10.5)
CO2: 25 meq/L (ref 19–32)
Chloride: 102 mEq/L (ref 96–112)
Creatinine, Ser: 1.24 mg/dL (ref 0.50–1.35)
GFR calc Af Amer: 66 mL/min — ABNORMAL LOW (ref >90)
GFR, EST NON AFRICAN AMERICAN: 57 mL/min — AB (ref >90)
GLUCOSE: 106 mg/dL — AB (ref 70–99)
Potassium: 3.9 mEq/L (ref 3.7–5.3)
SODIUM: 141 meq/L (ref 137–147)

## 2013-05-29 MED ORDER — FUROSEMIDE 20 MG PO TABS: 20.0000 mg | ORAL_TABLET | Freq: Every day | ORAL | Status: DC

## 2013-05-29 MED ORDER — ALBUTEROL SULFATE HFA 108 (90 BASE) MCG/ACT IN AERS: 2.0000 | INHALATION_SPRAY | Freq: Four times a day (QID) | RESPIRATORY_TRACT | Status: DC | PRN

## 2013-05-29 NOTE — Progress Notes (Signed)
Patient weaned off O2. Sats 93% on RA. [name] R

## 2013-05-29 NOTE — Progress Notes (Signed)
Went over discharge instructions with patient. No additional questions or concerns related to discharge instructions. IV d/c'd, patient taken off cardiac monitor. Patient and wife refusing home health.  Patient to discharg home.

## 2013-05-29 NOTE — Discharge Summary (Signed)
Physician Discharge Summary  [name] RSW:[id number] DOB: 05/24/1940 DOA: 05/24/2013  PCP: Owens Loffler, MD  Admit date: 05/24/2013 Discharge date: 05/29/2013  Time spent: More than 35 minutes minutes  Recommendations for Outpatient Follow-up:  1. Please reassess blood pressure and decide whether patient can go back on ACE inhibitor. 2. Also reassess serum creatinine levels 3. The patient indicated to take Lasix should he gain 3 pounds from one day to the next.  Discharge Diagnoses:  Principal Problem:   Acute respiratory failure Active Problems:   HYPOTHYROIDISM NOS   HYPERCHOLESTEROLEMIA   COPD (chronic obstructive pulmonary disease)   CAD (coronary artery disease), native coronary artery   Ischemic cardiomyopathy   HTN (hypertension)   CAP (community acquired pneumonia)   ARF (acute renal failure)   Paroxysmal a-fib: briefly in ED now in NSR   SIRS (systemic inflammatory response syndrome)   Discharge Condition: Stable  Diet recommendation: Low sodium heart healthy  Filed Weights   05/24/13 1715  Weight: 78.8 kg (173 lb 11.6 oz)    History of present illness:  The patient is a 73 year old Caucasian male with past medical history of COPD, CAD, and chronic diastolic CHF. Who presented to the ED complaining of shortness of breath.  Hospital Course:  Acute on chronic diastolic CHF with EF of 72% - Will discharge on beta blocker and Lasix - Recommend the patient following up with primary care physician one to 2 weeks post hospital discharge  Acute respiratory failure - Resolved most likely secondary to above problem - Patient was influenza negative - Chest x-ray reported increased left greater than right interstitial infiltrates versus asymmetric edema - Improved with IV Lasix - He did however receive initially antibiotic ceftriaxone and azithromycin.  Hypertension - Will discharge off of amlodipine and on carvedilol. Unable to use ACE inhibitor due to soft  blood pressures  COPD -Will discharge with prescription for albuterol HFA - No wheezes on exam  ARF - Improved after IV fluid administration and improved oral intake.  Procedures:  As listed below  Consultations:  Critical care initially evaluated secondary to respiratory failure  Discharge Exam: Filed Vitals:   05/29/13 0510  BP: 118/54  Pulse: 90  Temp: 97.9 F (36.6 C)  Resp: 18    General: Patient in no acute distress, sitting up in chair, requesting discharge, alert and awake Cardiovascular: Regular rate and rhythm, no murmurs rubs Respiratory: Clear to auscultation bilaterally, breath sounds bilaterally, breathing comfortably on room air, speaking in full sentences  Discharge Instructions  Discharge Orders   Future Appointments Provider Department Dept Phone   07/15/2013 9:00 AM Wellington Hampshire, MD [place] [phone]   Future Orders Complete By Expires   ([place]) Call MD:  Anytime you have any of the following symptoms: 1) 3 pound weight gain in 24 hours or 5 pounds in 1 week 2) shortness of breath, with or without a dry hacking cough 3) swelling in the hands, feet or stomach 4) if you have to sleep on extra pillows at night in order to breathe.  As directed    Call MD for:  difficulty breathing, headache or visual disturbances  As directed    Call MD for:  temperature >100.4  As directed    Diet - low sodium heart healthy  As directed    Discharge instructions  As directed    Comments:     Please follow up with your cardiologist or primary care physician within the next week  or 2.  For yourself daily and if you gain 3 pounds from one day to the next may take her Lasix.   Increase activity slowly  As directed        Medication List    STOP taking these medications       amLODipine 10 MG tablet  Commonly known as:  NORVASC     lisinopril 5 MG tablet  Commonly known as:  PRINIVIL,ZESTRIL     oseltamivir 75 MG capsule   Commonly known as:  TAMIFLU      TAKE these medications       acetaminophen 500 MG tablet  Commonly known as:  TYLENOL  Take 1,000 mg by mouth every 8 (eight) hours as needed for mild pain.     aspirin EC 81 MG tablet  Take 81 mg by mouth daily.     atorvastatin 20 MG tablet  Commonly known as:  LIPITOR  Take 20 mg by mouth daily.     carvedilol 3.125 MG tablet  Commonly known as:  COREG  Take 3.125 mg by mouth 2 (two) times daily with a meal.     furosemide 20 MG tablet  Commonly known as:  LASIX  Take 1 tablet (20 mg total) by mouth daily.     levothyroxine 200 MCG tablet  Commonly known as:  SYNTHROID, LEVOTHROID  Take 200 mcg by mouth daily before breakfast.       Allergies  Allergen Reactions  . Plavix [Clopidogrel Bisulfate]     Brain hemorrhage prev while on plavix and aspirin      The results of significant diagnostics from this hospitalization (including imaging, microbiology, ancillary and laboratory) are listed below for reference.    Significant Diagnostic Studies: Dg Chest 2 View  05/28/2013   CLINICAL DATA:  Shortness of breath and weakness.  EXAM: CHEST  2 VIEW  COMPARISON:  05/25/2013  FINDINGS: Sequelae of prior CABG are again identified. Vascular stents project over the upper left hemithorax. The cardiomediastinal silhouette is within normal limits. Interstitial opacities in the left mid and lower lung and to a lesser extent right perihilar region are stable to slightly increased from the prior study. There is a small left pleural effusion. There is no pneumothorax. No acute osseous abnormality is identified.  IMPRESSION: Stable to minimally increased left greater than right interstitial infiltrates versus asymmetric edema. Small left pleural effusion.   Electronically Signed   By: Logan Bores   On: 05/28/2013 09:11   US Renal  05/25/2013   CLINICAL DATA:  Acute renal failure.  EXAM: RENAL/URINARY TRACT ULTRASOUND COMPLETE  COMPARISON:  None.   FINDINGS: Right Kidney:  Length: 14.1 cm. Mild renal parenchymal thinning most evident along the lower pole. There is a simple cyst protruding medially from the midpole been 6.9 cm. No other renal masses, no stones and no hydronephrosis.  Left Kidney:  Length: 12.2 cm. There is diffuse renal cortical thinning. There are no renal masses, stones or hydronephrosis.  Bladder:  Bladder is mildly distended. It is unremarkable. Volume was measured at 226 ml.  IMPRESSION: 1. Bilateral renal cortical thinning, greater on the right. 2. No stones.  No hydronephrosis. 3. 6.9 cm left renal cyst.  No other renal masses.   Electronically Signed   By: Lajean Manes M.D.   On: 05/25/2013 08:07   Dg Chest Port 1 View  05/25/2013   CLINICAL DATA:  Pneumonia  EXAM: PORTABLE CHEST - 1 VIEW  COMPARISON:  the previous day's  study  FINDINGS: Prior CABG. Heart size normal. Coarse interstitial opacities in the left perihilar and infrahilar regions and to a milder degree in the right lung base, stable. . Question small left pleural effusion.  IMPRESSION: 1. Stable asymmetric infiltrates or edema.   Electronically Signed   By: Arne Cleveland M.D.   On: 05/25/2013 07:51   Dg Chest Port 1 View  05/24/2013   CLINICAL DATA:  Shortness of breath. Current history of COPD. Prior history of CHF.  EXAM: PORTABLE CHEST - 1 VIEW 05/24/2013 2111 hr:  COMPARISON:  DG CHEST 1V PORT dated 05/24/2013 at 1528 hr ; CT ANGIO CHEST W/CM &/OR WO/CM dated 09/13/2012; CT ANGIO CHEST W/CM dated 07/15/2012; DG CHEST 2 VIEW dated 07/15/2012  FINDINGS: Prior sternotomy for CABG. Cardiac silhouette normal in size for the AP portable technique. Interstitial opacities throughout the left lung, similar to the examination earlier same date. This is superimposed upon baseline changes of chronic bronchitis and emphysema. Right lung remains clear. No new pulmonary parenchymal abnormalities.  IMPRESSION: Asymmetric interstitial pulmonary edema versus interstitial pneumonia  involving the left lung, superimposed upon COPD/emphysema, unchanged since earlier the same date. No new abnormalities.   Electronically Signed   By: Evangeline Dakin M.D.   On: 05/24/2013 21:39   Dg Chest Port 1 View  05/24/2013   CLINICAL DATA:  Productive cough, shortness of breath, history COPD, hypertension, smoking, CHF  EXAM: PORTABLE CHEST - 1 VIEW  COMPARISON:  Portable exam 1528 hr compared to 07/15/2012  FINDINGS: Normal heart size post median sternotomy.  Atherosclerotic calcification aorta.  Mediastinal contours and pulmonary vascularity normal.  Emphysematous changes with hazy left lower lobe infiltrate.  Remaining lungs clear.  No pleural effusion or pneumothorax.  IMPRESSION: Post CABG.  Hazy left lower lobe infiltrate.   Electronically Signed   By: Lavonia Dana M.D.   On: 05/24/2013 15:38    Microbiology: Recent Results (from the past 240 hour(s))  CULTURE, BLOOD (ROUTINE X 2)     Status: None   Collection Time    05/24/13  4:00 PM      Result Value Range Status   Specimen Description BLOOD RIGHT ARM   Final   Special Requests BOTTLES DRAWN AEROBIC AND ANAEROBIC 10CC   Final   Culture  Setup Time     Final   Value: 05/25/2013 00:41     Performed at [facility]   Culture     Final   Value:        BLOOD CULTURE RECEIVED NO GROWTH TO DATE CULTURE WILL BE HELD FOR 5 DAYS BEFORE ISSUING A FINAL NEGATIVE REPORT     Performed at [facility]   Report Status PENDING   Incomplete  CULTURE, BLOOD (ROUTINE X 2)     Status: None   Collection Time    05/24/13  4:15 PM      Result Value Range Status   Specimen Description BLOOD ARM RIGHT   Final   Special Requests BOTTLES DRAWN AEROBIC AND ANAEROBIC 10CC   Final   Culture  Setup Time     Final   Value: 05/25/2013 00:41     Performed at [facility]   Culture     Final   Value:        BLOOD CULTURE RECEIVED NO GROWTH TO DATE CULTURE WILL BE HELD FOR 5 DAYS BEFORE ISSUING A FINAL NEGATIVE REPORT     Performed  at [facility]   Report  Status PENDING   Incomplete  MRSA PCR SCREENING     Status: None   Collection Time    05/24/13  7:46 PM      Result Value Range Status   MRSA by PCR NEGATIVE  NEGATIVE Final   Comment:            The GeneXpert MRSA Assay (FDA     approved for NASAL specimens     only), is one component of a     comprehensive MRSA colonization     surveillance program. It is not     intended to diagnose MRSA     infection nor to guide or     monitor treatment for     MRSA infections.  URINE CULTURE     Status: None   Collection Time    05/24/13  9:41 PM      Result Value Range Status   Specimen Description URINE, CLEAN CATCH   Final   Special Requests NONE   Final   Culture  Setup Time     Final   Value: 05/25/2013 07:38     Performed at [place]     Final   Value: NO GROWTH     Performed at [facility]   Culture     Final   Value: NO GROWTH     Performed at [facility]   Report Status 05/26/2013 FINAL   Final     Labs: Basic Metabolic Panel:  Recent Labs Lab 05/24/13 2000 05/25/13 0305 05/26/13 0326 05/27/13 0525 05/28/13 0406 05/29/13 0305  NA 139 138 142 141  --  141  K 4.5 4.1 4.3 4.1 4.1 3.9  CL 100 102 107 104  --  102  CO2 21 18* 19 22  --  25  GLUCOSE 131* 126* 96 101*  --  106*  BUN 40* 41* 37* 24*  --  21  CREATININE 2.30* 2.22* 1.72* 1.42*  --  1.24  CALCIUM 8.7 8.4 8.7 8.7  --  8.8  MG 2.1  --   --   --   --   --    Liver Function Tests:  Recent Labs Lab 05/24/13 1510 05/24/13 2000 05/25/13 0305  AST 36 34 32  ALT 23 22 22   ALKPHOS 80 80 64  BILITOT 0.9 0.6 0.5  PROT 7.6 7.1 6.3  ALBUMIN 3.3* 2.9* 2.7*   No results found for this basename: LIPASE, AMYLASE,  in the last 168 hours No results found for this basename: AMMONIA,  in the last 168 hours CBC:  Recent Labs Lab 05/24/13 1510 05/24/13 1631 05/25/13 0305  WBC 9.7  --  7.4  NEUTROABS 6.2  --  4.6  HGB 15.8 16.0  13.2  HCT 46.9 47.0 39.1  MCV 89.3  --  87.9  PLT 213  --  201   Cardiac Enzymes:  Recent Labs Lab 05/24/13 2000 05/25/13 0114 05/25/13 0905  TROPONINI <0.30 <0.30 <0.30   BNP: BNP (last 3 results)  Recent Labs  07/16/12 1550 05/24/13 1615  PROBNP 9072.0* 1493.0*   CBG: No results found for this basename: GLUCAP,  in the last 168 hours     Signed:  Velvet Bathe  [facility] 05/29/2013, 10:27 AM

## 2013-05-31 LAB — CULTURE, BLOOD (ROUTINE X 2)
CULTURE: NO GROWTH
Culture: NO GROWTH

## 2013-06-02 ENCOUNTER — Inpatient Hospital Stay: Payer: Self-pay | Admitting: Internal Medicine

## 2013-06-02 DIAGNOSIS — I359 Nonrheumatic aortic valve disorder, unspecified: Secondary | ICD-10-CM | POA: Diagnosis present

## 2013-06-02 DIAGNOSIS — R079 Chest pain, unspecified: Secondary | ICD-10-CM

## 2013-06-02 DIAGNOSIS — I701 Atherosclerosis of renal artery: Secondary | ICD-10-CM | POA: Diagnosis present

## 2013-06-02 DIAGNOSIS — I4891 Unspecified atrial fibrillation: Secondary | ICD-10-CM | POA: Diagnosis not present

## 2013-06-02 DIAGNOSIS — Z8701 Personal history of pneumonia (recurrent): Secondary | ICD-10-CM | POA: Diagnosis not present

## 2013-06-02 DIAGNOSIS — J189 Pneumonia, unspecified organism: Secondary | ICD-10-CM | POA: Diagnosis not present

## 2013-06-02 DIAGNOSIS — I10 Essential (primary) hypertension: Secondary | ICD-10-CM | POA: Diagnosis present

## 2013-06-02 DIAGNOSIS — Z888 Allergy status to other drugs, medicaments and biological substances status: Secondary | ICD-10-CM | POA: Diagnosis not present

## 2013-06-02 DIAGNOSIS — E785 Hyperlipidemia, unspecified: Secondary | ICD-10-CM | POA: Diagnosis present

## 2013-06-02 DIAGNOSIS — I509 Heart failure, unspecified: Secondary | ICD-10-CM | POA: Diagnosis present

## 2013-06-02 DIAGNOSIS — R52 Pain, unspecified: Secondary | ICD-10-CM | POA: Diagnosis not present

## 2013-06-02 DIAGNOSIS — Z86718 Personal history of other venous thrombosis and embolism: Secondary | ICD-10-CM | POA: Diagnosis not present

## 2013-06-02 DIAGNOSIS — I2510 Atherosclerotic heart disease of native coronary artery without angina pectoris: Secondary | ICD-10-CM | POA: Diagnosis not present

## 2013-06-02 DIAGNOSIS — J449 Chronic obstructive pulmonary disease, unspecified: Secondary | ICD-10-CM | POA: Diagnosis present

## 2013-06-02 DIAGNOSIS — I5032 Chronic diastolic (congestive) heart failure: Secondary | ICD-10-CM | POA: Diagnosis not present

## 2013-06-02 DIAGNOSIS — Z9861 Coronary angioplasty status: Secondary | ICD-10-CM | POA: Diagnosis not present

## 2013-06-02 DIAGNOSIS — E039 Hypothyroidism, unspecified: Secondary | ICD-10-CM | POA: Diagnosis present

## 2013-06-02 DIAGNOSIS — Z87891 Personal history of nicotine dependence: Secondary | ICD-10-CM | POA: Diagnosis not present

## 2013-06-02 DIAGNOSIS — Z9089 Acquired absence of other organs: Secondary | ICD-10-CM | POA: Diagnosis not present

## 2013-06-02 DIAGNOSIS — Z7982 Long term (current) use of aspirin: Secondary | ICD-10-CM | POA: Diagnosis not present

## 2013-06-02 DIAGNOSIS — M129 Arthropathy, unspecified: Secondary | ICD-10-CM | POA: Diagnosis present

## 2013-06-02 DIAGNOSIS — N179 Acute kidney failure, unspecified: Secondary | ICD-10-CM | POA: Diagnosis present

## 2013-06-02 DIAGNOSIS — J984 Other disorders of lung: Secondary | ICD-10-CM | POA: Diagnosis not present

## 2013-06-02 DIAGNOSIS — R0789 Other chest pain: Secondary | ICD-10-CM | POA: Diagnosis not present

## 2013-06-02 DIAGNOSIS — Z8673 Personal history of transient ischemic attack (TIA), and cerebral infarction without residual deficits: Secondary | ICD-10-CM | POA: Diagnosis not present

## 2013-06-02 DIAGNOSIS — I739 Peripheral vascular disease, unspecified: Secondary | ICD-10-CM | POA: Diagnosis present

## 2013-06-02 DIAGNOSIS — Z79899 Other long term (current) drug therapy: Secondary | ICD-10-CM | POA: Diagnosis not present

## 2013-06-02 DIAGNOSIS — Z951 Presence of aortocoronary bypass graft: Secondary | ICD-10-CM | POA: Diagnosis not present

## 2013-06-02 LAB — BASIC METABOLIC PANEL
Anion Gap: 6 — ABNORMAL LOW (ref 7–16)
BUN: 19 mg/dL — ABNORMAL HIGH (ref 7–18)
CALCIUM: 8.9 mg/dL (ref 8.5–10.1)
CHLORIDE: 103 mmol/L (ref 98–107)
Co2: 29 mmol/L (ref 21–32)
Creatinine: 1.48 mg/dL — ABNORMAL HIGH (ref 0.60–1.30)
EGFR (African American): 54 — ABNORMAL LOW
GFR CALC NON AF AMER: 47 — AB
Glucose: 91 mg/dL (ref 65–99)
Osmolality: 278 (ref 275–301)
Potassium: 4.1 mmol/L (ref 3.5–5.1)
SODIUM: 138 mmol/L (ref 136–145)

## 2013-06-02 LAB — TROPONIN I
Troponin-I: <0.02 ng/mL
Troponin-I: <0.02 ng/mL

## 2013-06-02 LAB — CK TOTAL AND CKMB (NOT AT ARMC)
CK, Total: 47 U/L
CK, Total: 45 U/L
CK-MB: 0.8 ng/mL (ref 0.5–3.6)
CK-MB: 0.8 ng/mL (ref 0.5–3.6)

## 2013-06-02 LAB — HEPATIC FUNCTION PANEL A (ARMC)
Albumin: 2.5 g/dL — ABNORMAL LOW (ref 3.4–5.0)
Alkaline Phosphatase: 79 U/L
Bilirubin, Direct: 0.2 mg/dL (ref 0.00–0.20)
Bilirubin,Total: 0.5 mg/dL (ref 0.2–1.0)
SGOT(AST): 31 U/L (ref 15–37)
SGPT (ALT): 61 U/L (ref 12–78)
TOTAL PROTEIN: 7.5 g/dL (ref 6.4–8.2)

## 2013-06-02 LAB — CBC
HCT: 39.5 % — ABNORMAL LOW (ref 40.0–52.0)
HGB: 13 g/dL (ref 13.0–18.0)
MCH: 28.9 pg (ref 26.0–34.0)
MCHC: 32.8 g/dL (ref 32.0–36.0)
MCV: 88 fL (ref 80–100)
PLATELETS: 381 10*3/uL (ref 150–440)
RBC: 4.49 10*6/uL (ref 4.40–5.90)
RDW: 14 % (ref 11.5–14.5)
WBC: 11 10*3/uL — AB (ref 3.8–10.6)

## 2013-06-02 LAB — PRO B NATRIURETIC PEPTIDE: B-TYPE NATIURETIC PEPTID: 781 pg/mL — AB (ref 0–125)

## 2013-06-02 LAB — PROTIME-INR
INR: 1.3
PROTHROMBIN TIME: 15.5 s — AB (ref 11.5–14.7)

## 2013-06-03 DIAGNOSIS — I5032 Chronic diastolic (congestive) heart failure: Secondary | ICD-10-CM

## 2013-06-03 DIAGNOSIS — R079 Chest pain, unspecified: Secondary | ICD-10-CM | POA: Diagnosis not present

## 2013-06-03 DIAGNOSIS — I4891 Unspecified atrial fibrillation: Secondary | ICD-10-CM

## 2013-06-03 DIAGNOSIS — J189 Pneumonia, unspecified organism: Secondary | ICD-10-CM | POA: Diagnosis not present

## 2013-06-03 DIAGNOSIS — I2510 Atherosclerotic heart disease of native coronary artery without angina pectoris: Secondary | ICD-10-CM

## 2013-06-03 LAB — CBC WITH DIFFERENTIAL/PLATELET
Basophil: 1 %
COMMENT - H1-COM4: NORMAL
EOS PCT: 4 %
HCT: 37.3 % — AB (ref 40.0–52.0)
HGB: 12.8 g/dL — AB (ref 13.0–18.0)
Lymphocytes: 33 %
MCH: 29.9 pg (ref 26.0–34.0)
MCHC: 34.2 g/dL (ref 32.0–36.0)
MCV: 87 fL (ref 80–100)
Metamyelocyte: 2 %
Monocytes: 7 %
Platelet: 323 10*3/uL (ref 150–440)
RBC: 4.27 10*6/uL — ABNORMAL LOW (ref 4.40–5.90)
RDW: 14.2 % (ref 11.5–14.5)
Segmented Neutrophils: 48 %
Variant Lymphocyte - H1-Rlymph: 5 %
WBC: 6.1 10*3/uL (ref 3.8–10.6)

## 2013-06-03 LAB — COMPREHENSIVE METABOLIC PANEL
ANION GAP: 3 — AB (ref 7–16)
Albumin: 2.1 g/dL — ABNORMAL LOW (ref 3.4–5.0)
Alkaline Phosphatase: 97 U/L
BILIRUBIN TOTAL: 0.6 mg/dL (ref 0.2–1.0)
BUN: 22 mg/dL — ABNORMAL HIGH (ref 7–18)
CHLORIDE: 106 mmol/L (ref 98–107)
CO2: 27 mmol/L (ref 21–32)
Calcium, Total: 8.8 mg/dL (ref 8.5–10.1)
Creatinine: 1.59 mg/dL — ABNORMAL HIGH (ref 0.60–1.30)
EGFR (African American): 50 — ABNORMAL LOW
GFR CALC NON AF AMER: 43 — AB
GLUCOSE: 80 mg/dL (ref 65–99)
Osmolality: 274 (ref 275–301)
Potassium: 4.1 mmol/L (ref 3.5–5.1)
SGOT(AST): 34 U/L (ref 15–37)
SGPT (ALT): 52 U/L (ref 12–78)
SODIUM: 136 mmol/L (ref 136–145)
Total Protein: 6.7 g/dL (ref 6.4–8.2)

## 2013-06-04 ENCOUNTER — Telehealth: Payer: Self-pay

## 2013-06-04 NOTE — Telephone Encounter (Signed)
Patient contacted regarding discharge from [facility] on 06/03/13.  Patient understands to follow up with Dr. Fletcher Anon on 06/13/13 at 12:15 at [facility]. Patient understands discharge instructions? yes Patient understands medications and regiment? yes Patient understands to bring all medications to this visit? yes  Pt's wife expresses concern that his meds were changed while he was in [facility], as some of his cardiac meds were stopped and he is only taking carvedilol.  She would like to discuss this w/ Dr. Fletcher Anon at pt's ov.

## 2013-06-07 ENCOUNTER — Encounter: Payer: Self-pay | Admitting: *Deleted

## 2013-06-07 LAB — CULTURE, BLOOD (SINGLE)

## 2013-06-12 ENCOUNTER — Ambulatory Visit (INDEPENDENT_AMBULATORY_CARE_PROVIDER_SITE_OTHER): Payer: Medicare Other | Admitting: Family Medicine

## 2013-06-12 ENCOUNTER — Encounter: Payer: Self-pay | Admitting: Family Medicine

## 2013-06-12 VITALS — BP 140/84 | HR 82 | Temp 98.2°F | Ht 69.0 in | Wt 168.5 lb

## 2013-06-12 DIAGNOSIS — I2589 Other forms of chronic ischemic heart disease: Secondary | ICD-10-CM | POA: Diagnosis not present

## 2013-06-12 DIAGNOSIS — R6510 Systemic inflammatory response syndrome (SIRS) of non-infectious origin without acute organ dysfunction: Secondary | ICD-10-CM

## 2013-06-12 DIAGNOSIS — N183 Chronic kidney disease, stage 3 unspecified: Secondary | ICD-10-CM

## 2013-06-12 DIAGNOSIS — I480 Paroxysmal atrial fibrillation: Secondary | ICD-10-CM

## 2013-06-12 DIAGNOSIS — J189 Pneumonia, unspecified organism: Secondary | ICD-10-CM | POA: Diagnosis not present

## 2013-06-12 DIAGNOSIS — I255 Ischemic cardiomyopathy: Secondary | ICD-10-CM

## 2013-06-12 DIAGNOSIS — I2510 Atherosclerotic heart disease of native coronary artery without angina pectoris: Secondary | ICD-10-CM

## 2013-06-12 DIAGNOSIS — J449 Chronic obstructive pulmonary disease, unspecified: Secondary | ICD-10-CM

## 2013-06-12 DIAGNOSIS — I10 Essential (primary) hypertension: Secondary | ICD-10-CM | POA: Diagnosis not present

## 2013-06-12 DIAGNOSIS — I4891 Unspecified atrial fibrillation: Secondary | ICD-10-CM

## 2013-06-12 DIAGNOSIS — I615 Nontraumatic intracerebral hemorrhage, intraventricular: Secondary | ICD-10-CM

## 2013-06-12 DIAGNOSIS — J9600 Acute respiratory failure, unspecified whether with hypoxia or hypercapnia: Secondary | ICD-10-CM

## 2013-06-12 DIAGNOSIS — I739 Peripheral vascular disease, unspecified: Secondary | ICD-10-CM

## 2013-06-12 DIAGNOSIS — I619 Nontraumatic intracerebral hemorrhage, unspecified: Secondary | ICD-10-CM

## 2013-06-12 NOTE — Progress Notes (Signed)
Date:  06/12/2013   Name:  [name]   DOB:  Aug 20, 1940   MRN:  [id number]  Primary Physician:  Owens Loffler, MD   Chief Complaint: Hospitalization Follow-up   Subjective:   History of Present Illness:  [name] is a 73 y.o. pleasant patient who presents with the following:  Complex patient in h/p CAD, s/p CABG in 2013, significant PAD, IVH 06/2012, CHF, RAS, hypothyroidism, d/c from [facility] after being in Step-down unit with respiratory failure on BiPap who initially thought CHF vs infiltrate on CXR and given IV lasix and IV Rocephin and Zithromax with improvement.   After d/c from [facility], the patient continued to worsen and was then admissed to [facility], ultimately dx with PNA and placed on LVQ and Zosyn, continued on Levaquin outpatient, and now his breathing feels "the best it has in a long time." D/c 06/03/2013.  Now off his ACE inhibitor  Also had an abnormal V/Q scan at [facility], IVC filter is in place from prior.  [facility] Admit date: 05/24/2013 Discharge date: 05/29/2013  Time spent: More than 35 minutes minutes  Recommendations for Outpatient Follow-up:   1. Please reassess blood pressure and decide whether patient can go back on ACE inhibitor. 2. Also reassess serum creatinine levels 3. The patient indicated to take Lasix should he gain 3 pounds from one day to the next.  Discharge Diagnoses:   Principal Problem:   Acute respiratory failure Active Problems:   HYPOTHYROIDISM NOS   HYPERCHOLESTEROLEMIA   COPD (chronic obstructive pulmonary disease)   CAD (coronary artery disease), native coronary artery   Ischemic cardiomyopathy   HTN (hypertension)   CAP (community acquired pneumonia)   ARF (acute renal failure)   Paroxysmal a-fib: briefly in ED now in NSR   SIRS (systemic inflammatory response syndrome)  History of present illness:   The patient is a 73 year old Caucasian male with past medical history of COPD, CAD, and chronic diastolic CHF. Who presented to the  ED complaining of shortness of breath.  Hospital Course:  Acute on chronic diastolic CHF with EF of 54% - Will discharge on beta blocker and Lasix - Recommend the patient following up with primary care physician one to 2 weeks post hospital discharge  Acute respiratory failure - Resolved most likely secondary to above problem - Patient was influenza negative - Chest x-ray reported increased left greater than right interstitial infiltrates versus asymmetric edema - Improved with IV Lasix - He did however receive initially antibiotic ceftriaxone and azithromycin.  Hypertension - Will discharge off of amlodipine and on carvedilol. Unable to use ACE inhibitor due to soft blood pressures  COPD -Will discharge with prescription for albuterol HFA - No wheezes on exam  ARF - Improved after IV fluid administration and improved oral intake.  Procedures:  As listed below  Consultations:  Critical care initially evaluated secondary to respiratory failure  Patient Active Problem List   Diagnosis Date Noted  . CAP (community acquired pneumonia) 05/24/2013  . Acute respiratory failure 05/24/2013  . ARF (acute renal failure) 05/24/2013  . Paroxysmal a-fib: briefly in ED now in NSR 05/24/2013  . SIRS (systemic inflammatory response syndrome) 05/24/2013  . Influenza 05/22/2013  . Aortic calcification 05/01/2013  . CKD (chronic kidney disease) stage 3, GFR 30-59 ml/min 05/01/2013  . Bilateral renal artery stenosis   . Right leg DVT 08/13/2012  . HTN (hypertension) 07/15/2012  . Intraventricular hemorrhage 07/12/2012  . Peripheral arterial disease   . Ischemic cardiomyopathy 07/08/2011  .  CAD (coronary artery disease), native coronary artery 07/07/2011  . Carotid bruit 04/25/2011  . Subclavian arterial stenosis 04/25/2011  . BPH (benign prostatic hyperplasia) 12/29/2010  . COPD (chronic obstructive pulmonary disease) 01/08/2010  . ABDOMINAL AORTIC ANEURYSM 11/06/2009  .  HYPERCHOLESTEROLEMIA 10/09/2009  . TOBACCO ABUSE 04/11/2008  . PROSTATE SPECIFIC ANTIGEN, ELEVATED 04/11/2008  . HYPOTHYROIDISM NOS 09/07/2006    Past Medical History  Diagnosis Date  . Venous insufficiency   . Peripheral arterial disease     a. Previous left lower extremity stenting by Dr. Jamal Collin;  b. 12/2012 s/p bilat ostial common iliac stenting.  . Tobacco abuse   . COPD (chronic obstructive pulmonary disease)   . Hypothyroidism   . Subclavian artery stenosis, left 05/2012    Status post stenting of the ostium and self-expanding stent placement to the left axillary artery  . Right leg DVT 08/13/2012  . Intraventricular hemorrhage 07/12/2012  . Hypertension   . CHF (congestive heart failure)   . Arthritis     "hands; not dx'd" (12/26/2012)  . Bilateral renal artery stenosis   . Aortic calcification 05/01/2013  . Pneumonia     Past Surgical History  Procedure Laterality Date  . Angioplasty / stenting femoral  05/2012  . Coronary artery bypass graft  07/11/2011    Procedure: CORONARY ARTERY BYPASS GRAFTING (CABG);  Surgeon: Grace Isaac, MD;  Location: [place];  Service: Open Heart Surgery;  Laterality: N/A;  Times 3. On Pump. Using right greater saphenous vein and left internal mammary artery.   . Cardiac catheterization  2/14    [facility]  . Subclavian artery stent  05/2012    "2 stents" (12/26/2012)  . Vena cava filter placement  07/2012    Removable  . Angioplasty / stenting iliac Bilateral 12/26/2012  . Cholecystectomy  1990's  . Hernia repair      History   Social History  . Marital Status: Married    Spouse Name: N/A    Number of Children: 2  . Years of Education: N/A   Occupational History  . Retired     Land   Social History Main Topics  . Smoking status: Former Smoker -- 1.00 packs/day for 50 years    Types: Cigarettes    Quit date: 04/27/2011  . Smokeless tobacco: Never Used  . Alcohol Use: No  . Drug Use: No  . Sexual Activity: Not Currently    Other Topics Concern  . Not on file   Social History Narrative   Exercsiing 3 times a week.    Moderate diet control.    O living will, no HCPOA.    Family History  Problem Relation Age of Onset  . Alcohol abuse Father   . Cirrhosis Father   . Hypothyroidism Sister     Allergies  Allergen Reactions  . Plavix [Clopidogrel Bisulfate]     Brain hemorrhage prev while on plavix and aspirin    Medication list has been reviewed and updated.  Review of Systems:   GEN: No acute illnesses, no fevers, chills. GI: No n/v/d, eating normally Pulm: No SOB -- much, much less than prior Interactive and getting along well at home.  Otherwise, ROS is as per the HPI.  Objective:   Physical Examination: BP 140/84  Pulse 82  Temp(Src) 98.2 F (36.8 C) (Oral)  Ht _0  (1.753 m)  Wt 168 lb 8 oz (76.431 kg)  BMI 24.87 kg/m2  SpO2 96%  Ideal Body Weight: Weight in (lb) to  have BMI = 25: 168.9   GEN: WDWN, NAD, Non-toxic, A & O x 3 HEENT: Atraumatic, Normocephalic. Neck supple. No masses, No LAD. Ears and Nose: No external deformity. CV: RRR, No M/G/R. No JVD. No thrill. No extra heart sounds. PULM: CTA B, no wheezes, crackles, rhonchi. No retractions. No resp. distress. No accessory muscle use. EXTR: No c/c/e NEURO Normal gait.  PSYCH: Normally interactive. Conversant. Not depressed or anxious appearing.  Calm demeanor.   Laboratory and Imaging Data: US Renal  05/25/2013   CLINICAL DATA:  Acute renal failure.  EXAM: RENAL/URINARY TRACT ULTRASOUND COMPLETE  COMPARISON:  None.  FINDINGS: Right Kidney:  Length: 14.1 cm. Mild renal parenchymal thinning most evident along the lower pole. There is a simple cyst protruding medially from the midpole been 6.9 cm. No other renal masses, no stones and no hydronephrosis.  Left Kidney:  Length: 12.2 cm. There is diffuse renal cortical thinning. There are no renal masses, stones or hydronephrosis.  Bladder:  Bladder is mildly distended. It  is unremarkable. Volume was measured at 226 ml.  IMPRESSION: 1. Bilateral renal cortical thinning, greater on the right. 2. No stones.  No hydronephrosis. 3. 6.9 cm left renal cyst.  No other renal masses.   Electronically Signed   By: Lajean Manes M.D.   On: 05/25/2013 08:07   Dg Chest Port 1 View  05/25/2013   CLINICAL DATA:  Pneumonia  EXAM: PORTABLE CHEST - 1 VIEW  COMPARISON:  the previous day's study  FINDINGS: Prior CABG. Heart size normal. Coarse interstitial opacities in the left perihilar and infrahilar regions and to a milder degree in the right lung base, stable. . Question small left pleural effusion.  IMPRESSION: 1. Stable asymmetric infiltrates or edema.   Electronically Signed   By: Arne Cleveland M.D.   On: 05/25/2013 07:51   Dg Chest Port 1 View  05/24/2013   CLINICAL DATA:  Shortness of breath. Current history of COPD. Prior history of CHF.  EXAM: PORTABLE CHEST - 1 VIEW 05/24/2013 2111 hr:  COMPARISON:  DG CHEST 1V PORT dated 05/24/2013 at 1528 hr ; CT ANGIO CHEST W/CM &/OR WO/CM dated 09/13/2012; CT ANGIO CHEST W/CM dated 07/15/2012; DG CHEST 2 VIEW dated 07/15/2012  FINDINGS: Prior sternotomy for CABG. Cardiac silhouette normal in size for the AP portable technique. Interstitial opacities throughout the left lung, similar to the examination earlier same date. This is superimposed upon baseline changes of chronic bronchitis and emphysema. Right lung remains clear. No new pulmonary parenchymal abnormalities.  IMPRESSION: Asymmetric interstitial pulmonary edema versus interstitial pneumonia involving the left lung, superimposed upon COPD/emphysema, unchanged since earlier the same date. No new abnormalities.   Electronically Signed   By: Evangeline Dakin M.D.   On: 05/24/2013 21:39   Dg Chest Port 1 View  05/24/2013   CLINICAL DATA:  Productive cough, shortness of breath, history COPD, hypertension, smoking, CHF  EXAM: PORTABLE CHEST - 1 VIEW  COMPARISON:  Portable exam 1528 hr compared to  07/15/2012  FINDINGS: Normal heart size post median sternotomy.  Atherosclerotic calcification aorta.  Mediastinal contours and pulmonary vascularity normal.  Emphysematous changes with hazy left lower lobe infiltrate.  Remaining lungs clear.  No pleural effusion or pneumothorax.  IMPRESSION: Post CABG.  Hazy left lower lobe infiltrate.   Electronically Signed   By: Lavonia Dana M.D.   On: 05/24/2013 15:38   Recent Results (from the past 2160 hour(s))  COMPREHENSIVE METABOLIC PANEL     Status: Abnormal  Collection Time    05/24/13  3:10 PM      Result Value Ref Range   Sodium 135 (*) 137 - 147 mEq/L   Potassium 5.0  3.7 - 5.3 mEq/L   Chloride 97  96 - 112 mEq/L   CO2 19  19 - 32 mEq/L   Glucose, Bld 114 (*) 70 - 99 mg/dL   BUN 37 (*) 6 - 23 mg/dL   Creatinine, Ser 2.32 (*) 0.50 - 1.35 mg/dL   Calcium 9.4  8.4 - 10.5 mg/dL   Total Protein 7.6  6.0 - 8.3 g/dL   Albumin 3.3 (*) 3.5 - 5.2 g/dL   AST 36  0 - 37 U/L   ALT 23  0 - 53 U/L   Alkaline Phosphatase 80  39 - 117 U/L   Total Bilirubin 0.9  0.3 - 1.2 mg/dL   GFR calc non Af Amer 27 (*) >90 mL/min   GFR calc Af Amer 31 (*) >90 mL/min   Comment: (NOTE)     The eGFR has been calculated using the CKD EPI equation.     This calculation has not been validated in all clinical situations.     eGFR's persistently <90 mL/min signify possible Chronic Kidney     Disease.  CBC WITH DIFFERENTIAL     Status: Abnormal   Collection Time    05/24/13  3:10 PM      Result Value Ref Range   WBC 9.7  4.0 - 10.5 K/uL   RBC 5.25  4.22 - 5.81 MIL/uL   Hemoglobin 15.8  13.0 - 17.0 g/dL   HCT 46.9  39.0 - 52.0 %   MCV 89.3  78.0 - 100.0 fL   MCH 30.1  26.0 - 34.0 pg   MCHC 33.7  30.0 - 36.0 g/dL   RDW 14.0  11.5 - 15.5 %   Platelets 213  150 - 400 K/uL   Neutrophils Relative % 64  43 - 77 %   Lymphocytes Relative 21  12 - 46 %   Monocytes Relative 15 (*) 3 - 12 %   Eosinophils Relative 0  0 - 5 %   Basophils Relative 0  0 - 1 %   Neutro Abs 6.2   1.7 - 7.7 K/uL   Lymphs Abs 2.0  0.7 - 4.0 K/uL   Monocytes Absolute 1.5 (*) 0.1 - 1.0 K/uL   Eosinophils Absolute 0.0  0.0 - 0.7 K/uL   Basophils Absolute 0.0  0.0 - 0.1 K/uL   RBC Morphology POLYCHROMASIA PRESENT     WBC Morphology MILD LEFT SHIFT (1-5% METAS, OCC MYELO, OCC BANDS)     Comment: TOXIC GRANULATION     ATYPICAL LYMPHOCYTES   Smear Review LARGE PLATELETS PRESENT    POCT I-STAT TROPONIN I     Status: None   Collection Time    05/24/13  3:17 PM      Result Value Ref Range   Troponin i, poc 0.03  0.00 - 0.08 ng/mL   Comment 3            Comment: Due to the release kinetics of cTnI,     a negative result within the first hours     of the onset of symptoms does not rule out     myocardial infarction with certainty.     If myocardial infarction is still suspected,     repeat the test at appropriate intervals.  CG4 I-STAT (LACTIC ACID)  Status: Abnormal   Collection Time    05/24/13  3:19 PM      Result Value Ref Range   Lactic Acid, Venous 2.26 (*) 0.5 - 2.2 mmol/L  CULTURE, BLOOD (ROUTINE X 2)     Status: None   Collection Time    05/24/13  4:00 PM      Result Value Ref Range   Specimen Description BLOOD RIGHT ARM     Special Requests BOTTLES DRAWN AEROBIC AND ANAEROBIC 10CC     Culture  Setup Time       Value: 05/25/2013 00:41     Performed at [facility]   Culture       Value: NO GROWTH 5 DAYS     Performed at [facility]   Report Status 05/31/2013 FINAL    CULTURE, BLOOD (ROUTINE X 2)     Status: None   Collection Time    05/24/13  4:15 PM      Result Value Ref Range   Specimen Description BLOOD ARM RIGHT     Special Requests BOTTLES DRAWN AEROBIC AND ANAEROBIC 10CC     Culture  Setup Time       Value: 05/25/2013 00:41     Performed at [facility]   Culture       Value: NO GROWTH 5 DAYS     Performed at [facility]   Report Status 05/31/2013 FINAL    PRO B NATRIURETIC PEPTIDE     Status: Abnormal   Collection  Time    05/24/13  4:15 PM      Result Value Ref Range   Pro B Natriuretic peptide (BNP) 1493.0 (*) 0 - 125 pg/mL  POCT I-STAT, CHEM 8     Status: Abnormal   Collection Time    05/24/13  4:31 PM      Result Value Ref Range   Sodium 136 (*) 137 - 147 mEq/L   Potassium 4.5  3.7 - 5.3 mEq/L   Chloride 104  96 - 112 mEq/L   BUN 37 (*) 6 - 23 mg/dL   Creatinine, Ser 2.30 (*) 0.50 - 1.35 mg/dL   Glucose, Bld 113 (*) 70 - 99 mg/dL   Calcium, Ion 1.13  1.13 - 1.30 mmol/L   TCO2 21  0 - 100 mmol/L   Hemoglobin 16.0  13.0 - 17.0 g/dL   HCT 47.0  39.0 - 52.0 %  CG4 I-STAT (LACTIC ACID)     Status: None   Collection Time    05/24/13  4:32 PM      Result Value Ref Range   Lactic Acid, Venous 1.95  0.5 - 2.2 mmol/L  POCT I-STAT 3, BLOOD GAS (G3+)     Status: Abnormal   Collection Time    05/24/13  4:33 PM      Result Value Ref Range   pH, Arterial 7.390  7.350 - 7.450   pCO2 arterial 30.6 (*) 35.0 - 45.0 mmHg   pO2, Arterial 135.0 (*) 80.0 - 100.0 mmHg   Bicarbonate 18.5 (*) 20.0 - 24.0 mEq/L   TCO2 19  0 - 100 mmol/L   O2 Saturation 99.0     Acid-base deficit 5.0 (*) 0.0 - 2.0 mmol/L   Patient temperature 98.6 F     Collection site RADIAL, ALLEN'S TEST ACCEPTABLE     Drawn by Operator     Sample type ARTERIAL    MRSA PCR SCREENING     Status: None  Collection Time    05/24/13  7:46 PM      Result Value Ref Range   MRSA by PCR NEGATIVE  NEGATIVE   Comment:            The GeneXpert MRSA Assay (FDA     approved for NASAL specimens     only), is one component of a     comprehensive MRSA colonization     surveillance program. It is not     intended to diagnose MRSA     infection nor to guide or     monitor treatment for     MRSA infections.  PROCALCITONIN     Status: None   Collection Time    05/24/13  8:00 PM      Result Value Ref Range   Procalcitonin 4.03     Comment:            Interpretation:     PCT > 2 ng/mL:     Systemic infection (sepsis) is likely,     unless  other causes are known.     (NOTE)             ICU PCT Algorithm               Non ICU PCT Algorithm        ----------------------------     ------------------------------             PCT < 0.25 ng/mL                 PCT < 0.1 ng/mL         Stopping of antibiotics            Stopping of antibiotics           strongly encouraged.               strongly encouraged.        ----------------------------     ------------------------------           PCT level decrease by               PCT < 0.25 ng/mL           >= 80% from peak PCT           OR PCT 0.25 - 0.5 ng/mL          Stopping of antibiotics                                                 encouraged.         Stopping of antibiotics               encouraged.        ----------------------------     ------------------------------           PCT level decrease by              PCT >= 0.25 ng/mL           < 80% from peak PCT            AND PCT >= 0.5 ng/mL            Continuing antibiotics  encouraged.           Continuing antibiotics                encouraged.        ----------------------------     ------------------------------         PCT level increase compared          PCT > 0.5 ng/mL             with peak PCT AND              PCT >= 0.5 ng/mL             Escalation of antibiotics                                              strongly encouraged.          Escalation of antibiotics            strongly encouraged.  HIV ANTIBODY (ROUTINE TESTING)     Status: None   Collection Time    05/24/13  8:00 PM      Result Value Ref Range   HIV NON REACTIVE  NON REACTIVE   Comment: Performed at [place]     Status: None   Collection Time    05/24/13  8:00 PM      Result Value Ref Range   Magnesium 2.1  1.5 - 2.5 mg/dL  TSH     Status: None   Collection Time    05/24/13  8:00 PM      Result Value Ref Range   TSH 0.459  0.350 - 4.500 uIU/mL   Comment: Performed at  [place]     Status: Abnormal   Collection Time    05/24/13  8:00 PM      Result Value Ref Range   Sodium 139  137 - 147 mEq/L   Potassium 4.5  3.7 - 5.3 mEq/L   Chloride 100  96 - 112 mEq/L   CO2 21  19 - 32 mEq/L   Glucose, Bld 131 (*) 70 - 99 mg/dL   BUN 40 (*) 6 - 23 mg/dL   Creatinine, Ser 2.30 (*) 0.50 - 1.35 mg/dL   Calcium 8.7  8.4 - 10.5 mg/dL   Total Protein 7.1  6.0 - 8.3 g/dL   Albumin 2.9 (*) 3.5 - 5.2 g/dL   AST 34  0 - 37 U/L   ALT 22  0 - 53 U/L   Alkaline Phosphatase 80  39 - 117 U/L   Total Bilirubin 0.6  0.3 - 1.2 mg/dL   GFR calc non Af Amer 27 (*) >90 mL/min   GFR calc Af Amer 31 (*) >90 mL/min   Comment: (NOTE)     The eGFR has been calculated using the CKD EPI equation.     This calculation has not been validated in all clinical situations.     eGFR's persistently <90 mL/min signify possible Chronic Kidney     Disease.  PROTIME-INR     Status: Abnormal   Collection Time    05/24/13  8:00 PM      Result Value Ref Range   Prothrombin Time 17.1 (*) 11.6 - 15.2 seconds   INR 1.43  0.00 - 1.49  APTT  Status: Abnormal   Collection Time    05/24/13  8:00 PM      Result Value Ref Range   aPTT 39 (*) 24 - 37 seconds   Comment:            IF BASELINE aPTT IS ELEVATED,     SUGGEST PATIENT RISK ASSESSMENT     BE USED TO DETERMINE APPROPRIATE     ANTICOAGULANT THERAPY.  TROPONIN I     Status: None   Collection Time    05/24/13  8:00 PM      Result Value Ref Range   Troponin I <0.30  <0.30 ng/mL   Comment:            Due to the release kinetics of cTnI,     a negative result within the first hours     of the onset of symptoms does not rule out     myocardial infarction with certainty.     If myocardial infarction is still suspected,     repeat the test at appropriate intervals.  INFLUENZA PANEL BY PCR (TYPE A & B, H1N1)     Status: None   Collection Time    05/24/13  8:37 PM      Result Value Ref Range    Influenza A By PCR NEGATIVE  NEGATIVE   Influenza B By PCR NEGATIVE  NEGATIVE   H1N1 flu by pcr NOT DETECTED  NOT DETECTED   Comment:            The Xpert Flu assay (FDA approved for     nasal aspirates or washes and     nasopharyngeal swab specimens), is     intended as an aid in the diagnosis of     influenza and should not be used as     a sole basis for treatment.  URINALYSIS, ROUTINE W REFLEX MICROSCOPIC     Status: Abnormal   Collection Time    05/24/13  9:41 PM      Result Value Ref Range   Color, Urine AMBER (*) YELLOW   Comment: BIOCHEMICALS MAY BE AFFECTED BY COLOR   APPearance CLOUDY (*) CLEAR   Specific Gravity, Urine 1.017  1.005 - 1.030   pH 5.0  5.0 - 8.0   Glucose, UA NEGATIVE  NEGATIVE mg/dL   Hgb urine dipstick NEGATIVE  NEGATIVE   Bilirubin Urine NEGATIVE  NEGATIVE   Ketones, ur NEGATIVE  NEGATIVE mg/dL   Protein, ur 30 (*) NEGATIVE mg/dL   Urobilinogen, UA 1.0  0.0 - 1.0 mg/dL   Nitrite NEGATIVE  NEGATIVE   Leukocytes, UA NEGATIVE  NEGATIVE  URINE CULTURE     Status: None   Collection Time    05/24/13  9:41 PM      Result Value Ref Range   Specimen Description URINE, CLEAN CATCH     Special Requests NONE     Culture  Setup Time       Value: 05/25/2013 07:38     Performed at [place]       Value: NO GROWTH     Performed at [facility]   Culture       Value: NO GROWTH     Performed at [facility]   Report Status 05/26/2013 FINAL    SODIUM, URINE, RANDOM     Status: None   Collection Time    05/24/13  9:41 PM      Result  Value Ref Range   Sodium, Ur 55    CREATININE, URINE, RANDOM     Status: None   Collection Time    05/24/13  9:41 PM      Result Value Ref Range   Creatinine, Urine 142.03    LEGIONELLA ANTIGEN, URINE     Status: None   Collection Time    05/24/13  9:41 PM      Result Value Ref Range   Specimen Description URINE, CLEAN CATCH     Special Requests NONE     Legionella Antigen, Urine        Value: Negative for Legionella pneumophilia serogroup 1     Performed at [facility]   Report Status 05/25/2013 FINAL    STREP PNEUMONIAE URINARY ANTIGEN     Status: None   Collection Time    05/24/13  9:41 PM      Result Value Ref Range   Strep Pneumo Urinary Antigen NEGATIVE  NEGATIVE   Comment:            Infection due to S. pneumoniae     cannot be absolutely ruled out     since the antigen present     may be below the detection limit     of the test.  URINE MICROSCOPIC-ADD ON     Status: Abnormal   Collection Time    05/24/13  9:41 PM      Result Value Ref Range   Squamous Epithelial / LPF RARE  RARE   WBC, UA 0-2  <3 WBC/hpf   RBC / HPF 0-2  <3 RBC/hpf   Bacteria, UA RARE  RARE   Casts GRANULAR CAST (*) NEGATIVE   Urine-Other AMORPHOUS URATES/PHOSPHATES    TROPONIN I     Status: None   Collection Time    05/25/13  1:14 AM      Result Value Ref Range   Troponin I <0.30  <0.30 ng/mL   Comment:            Due to the release kinetics of cTnI,     a negative result within the first hours     of the onset of symptoms does not rule out     myocardial infarction with certainty.     If myocardial infarction is still suspected,     repeat the test at appropriate intervals.  PROCALCITONIN     Status: None   Collection Time    05/25/13  3:05 AM      Result Value Ref Range   Procalcitonin 4.05     Comment:            Interpretation:     PCT > 2 ng/mL:     Systemic infection (sepsis) is likely,     unless other causes are known.     (NOTE)             ICU PCT Algorithm               Non ICU PCT Algorithm        ----------------------------     ------------------------------             PCT < 0.25 ng/mL                 PCT < 0.1 ng/mL         Stopping of antibiotics            Stopping of antibiotics  strongly encouraged.               strongly encouraged.        ----------------------------     ------------------------------           PCT level  decrease by               PCT < 0.25 ng/mL           >= 80% from peak PCT           OR PCT 0.25 - 0.5 ng/mL          Stopping of antibiotics                                                 encouraged.         Stopping of antibiotics               encouraged.        ----------------------------     ------------------------------           PCT level decrease by              PCT >= 0.25 ng/mL           < 80% from peak PCT            AND PCT >= 0.5 ng/mL            Continuing antibiotics                                                  encouraged.           Continuing antibiotics                encouraged.        ----------------------------     ------------------------------         PCT level increase compared          PCT > 0.5 ng/mL             with peak PCT AND              PCT >= 0.5 ng/mL             Escalation of antibiotics                                              strongly encouraged.          Escalation of antibiotics            strongly encouraged.  COMPREHENSIVE METABOLIC PANEL     Status: Abnormal   Collection Time    05/25/13  3:05 AM      Result Value Ref Range   Sodium 138  137 - 147 mEq/L   Potassium 4.1  3.7 - 5.3 mEq/L   Chloride 102  96 - 112 mEq/L   CO2 18 (*) 19 - 32 mEq/L   Glucose, Bld 126 (*) 70 - 99 mg/dL   BUN 41 (*) 6 - 23 mg/dL   Creatinine, Ser 2.22 (*) 0.50 - 1.35  mg/dL   Calcium 8.4  8.4 - 10.5 mg/dL   Total Protein 6.3  6.0 - 8.3 g/dL   Albumin 2.7 (*) 3.5 - 5.2 g/dL   AST 32  0 - 37 U/L   ALT 22  0 - 53 U/L   Alkaline Phosphatase 64  39 - 117 U/L   Total Bilirubin 0.5  0.3 - 1.2 mg/dL   GFR calc non Af Amer 28 (*) >90 mL/min   GFR calc Af Amer 33 (*) >90 mL/min   Comment: (NOTE)     The eGFR has been calculated using the CKD EPI equation.     This calculation has not been validated in all clinical situations.     eGFR's persistently <90 mL/min signify possible Chronic Kidney     Disease.  CBC WITH DIFFERENTIAL     Status: Abnormal    Collection Time    05/25/13  3:05 AM      Result Value Ref Range   WBC 7.4  4.0 - 10.5 K/uL   RBC 4.45  4.22 - 5.81 MIL/uL   Hemoglobin 13.2  13.0 - 17.0 g/dL   Comment: REPEATED TO VERIFY   HCT 39.1  39.0 - 52.0 %   MCV 87.9  78.0 - 100.0 fL   MCH 29.7  26.0 - 34.0 pg   MCHC 33.8  30.0 - 36.0 g/dL   RDW 14.1  11.5 - 15.5 %   Platelets 201  150 - 400 K/uL   Neutrophils Relative % 63  43 - 77 %   Lymphocytes Relative 17  12 - 46 %   Monocytes Relative 19 (*) 3 - 12 %   Eosinophils Relative 1  0 - 5 %   Basophils Relative 0  0 - 1 %   Neutro Abs 4.6  1.7 - 7.7 K/uL   Lymphs Abs 1.3  0.7 - 4.0 K/uL   Monocytes Absolute 1.4 (*) 0.1 - 1.0 K/uL   Eosinophils Absolute 0.1  0.0 - 0.7 K/uL   Basophils Absolute 0.0  0.0 - 0.1 K/uL   WBC Morphology TOXIC GRANULATION     Comment: INCREASED BANDS (>20% BANDS)  TROPONIN I     Status: None   Collection Time    05/25/13  9:05 AM      Result Value Ref Range   Troponin I <0.30  <0.30 ng/mL   Comment:            Due to the release kinetics of cTnI,     a negative result within the first hours     of the onset of symptoms does not rule out     myocardial infarction with certainty.     If myocardial infarction is still suspected,     repeat the test at appropriate intervals.  BASIC METABOLIC PANEL     Status: Abnormal   Collection Time    05/26/13  3:26 AM      Result Value Ref Range   Sodium 142  137 - 147 mEq/L   Potassium 4.3  3.7 - 5.3 mEq/L   Chloride 107  96 - 112 mEq/L   CO2 19  19 - 32 mEq/L   Glucose, Bld 96  70 - 99 mg/dL   BUN 37 (*) 6 - 23 mg/dL   Creatinine, Ser 1.72 (*) 0.50 - 1.35 mg/dL   Calcium 8.7  8.4 - 10.5 mg/dL   GFR calc non Af Amer 38 (*) >90 mL/min   GFR calc  Af Amer 44 (*) >90 mL/min   Comment: (NOTE)     The eGFR has been calculated using the CKD EPI equation.     This calculation has not been validated in all clinical situations.     eGFR's persistently <90 mL/min signify possible Chronic Kidney      Disease.  PROCALCITONIN     Status: None   Collection Time    05/26/13  3:26 AM      Result Value Ref Range   Procalcitonin 2.11     Comment:            Interpretation:     PCT > 2 ng/mL:     Systemic infection (sepsis) is likely,     unless other causes are known.     (NOTE)             ICU PCT Algorithm               Non ICU PCT Algorithm        ----------------------------     ------------------------------             PCT < 0.25 ng/mL                 PCT < 0.1 ng/mL         Stopping of antibiotics            Stopping of antibiotics           strongly encouraged.               strongly encouraged.        ----------------------------     ------------------------------           PCT level decrease by               PCT < 0.25 ng/mL           >= 80% from peak PCT           OR PCT 0.25 - 0.5 ng/mL          Stopping of antibiotics                                                 encouraged.         Stopping of antibiotics               encouraged.        ----------------------------     ------------------------------           PCT level decrease by              PCT >= 0.25 ng/mL           < 80% from peak PCT            AND PCT >= 0.5 ng/mL            Continuing antibiotics                                                  encouraged.           Continuing antibiotics                encouraged.        ----------------------------     ------------------------------  PCT level increase compared          PCT > 0.5 ng/mL             with peak PCT AND              PCT >= 0.5 ng/mL             Escalation of antibiotics                                              strongly encouraged.          Escalation of antibiotics            strongly encouraged.  BASIC METABOLIC PANEL     Status: Abnormal   Collection Time    05/27/13  5:25 AM      Result Value Ref Range   Sodium 141  137 - 147 mEq/L   Potassium 4.1  3.7 - 5.3 mEq/L   Chloride 104  96 - 112 mEq/L   CO2 22  19 - 32 mEq/L    Glucose, Bld 101 (*) 70 - 99 mg/dL   BUN 24 (*) 6 - 23 mg/dL   Creatinine, Ser 1.42 (*) 0.50 - 1.35 mg/dL   Calcium 8.7  8.4 - 10.5 mg/dL   GFR calc non Af Amer 48 (*) >90 mL/min   GFR calc Af Amer 56 (*) >90 mL/min   Comment: (NOTE)     The eGFR has been calculated using the CKD EPI equation.     This calculation has not been validated in all clinical situations.     eGFR's persistently <90 mL/min signify possible Chronic Kidney     Disease.  POTASSIUM     Status: None   Collection Time    05/28/13  4:06 AM      Result Value Ref Range   Potassium 4.1  3.7 - 5.3 mEq/L  BASIC METABOLIC PANEL     Status: Abnormal   Collection Time    05/29/13  3:05 AM      Result Value Ref Range   Sodium 141  137 - 147 mEq/L   Potassium 3.9  3.7 - 5.3 mEq/L   Chloride 102  96 - 112 mEq/L   CO2 25  19 - 32 mEq/L   Glucose, Bld 106 (*) 70 - 99 mg/dL   BUN 21  6 - 23 mg/dL   Creatinine, Ser 1.24  0.50 - 1.35 mg/dL   Calcium 8.8  8.4 - 10.5 mg/dL   GFR calc non Af Amer 57 (*) >90 mL/min   GFR calc Af Amer 66 (*) >90 mL/min   Comment: (NOTE)     The eGFR has been calculated using the CKD EPI equation.     This calculation has not been validated in all clinical situations.     eGFR's persistently <90 mL/min signify possible Chronic Kidney     Disease.     Assessment & Plan:   CAP (community acquired pneumonia)  HTN (hypertension)  COPD (chronic obstructive pulmonary disease)  CAD (coronary artery disease), native coronary artery  Ischemic cardiomyopathy  Acute respiratory failure  SIRS (systemic inflammatory response syndrome)  Paroxysmal a-fib: briefly in ED now in NSR  CKD (chronic kidney disease) stage 3, GFR 30-59 ml/min  Peripheral arterial disease  Intraventricular hemorrhage  CAP, resolved. Appears to have been incompletely  treated CAP that recurred upon d/c from [facility], but promptly recovered with ABX.  Doubtful with clinical improvement that V/Q scan relevant to  symptoms. Resolution with ABX and IVC filter in place.   I would resume ACE inhibitor, but he wanted to discuss with Cardiology first, which is reasonable.   New Prescriptions   No medications on file   No orders of the defined types were placed in this encounter.   Signed,  Maud Deed. Delaynie Stetzer, MD, [place] [facility] [place] [address] Phone: [phone] Fax: [phone]  There are no Patient Instructions on file for this visit. Patient's Medications  New Prescriptions   No medications on file  Previous Medications   ACETAMINOPHEN (TYLENOL) 500 MG TABLET    Take 1,000 mg by mouth every 8 (eight) hours as needed for mild pain.   ASPIRIN EC 81 MG TABLET    Take 81 mg by mouth daily.   ATORVASTATIN (LIPITOR) 20 MG TABLET    Take 20 mg by mouth daily.   CARVEDILOL (COREG) 3.125 MG TABLET    Take 3.125 mg by mouth 2 (two) times daily with a meal.   LEVOTHYROXINE (SYNTHROID, LEVOTHROID) 200 MCG TABLET    Take 200 mcg by mouth daily before breakfast.  Modified Medications   Modified Medication Previous Medication   FUROSEMIDE (LASIX) 20 MG TABLET furosemide (LASIX) 20 MG tablet      Take 20 mg by mouth as needed.    Take 1 tablet (20 mg total) by mouth daily.  Discontinued Medications   ALBUTEROL (PROVENTIL HFA;VENTOLIN HFA) 108 (90 BASE) MCG/ACT INHALER    Inhale 2 puffs into the lungs every 6 (six) hours as needed for wheezing or shortness of breath.

## 2013-06-12 NOTE — Progress Notes (Signed)
Pre-visit discussion using our clinic review tool. No additional management support is needed unless otherwise documented below in the visit note.  

## 2013-06-13 ENCOUNTER — Encounter: Payer: Medicare Other | Admitting: Cardiovascular Disease

## 2013-06-27 ENCOUNTER — Ambulatory Visit (INDEPENDENT_AMBULATORY_CARE_PROVIDER_SITE_OTHER): Payer: Medicare Other | Admitting: Cardiovascular Disease

## 2013-06-27 ENCOUNTER — Encounter: Payer: Self-pay | Admitting: *Deleted

## 2013-06-27 ENCOUNTER — Encounter: Payer: Self-pay | Admitting: Cardiovascular Disease

## 2013-06-27 ENCOUNTER — Encounter (HOSPITAL_COMMUNITY): Payer: Self-pay | Admitting: Pharmacy Technician

## 2013-06-27 VITALS — BP 166/80 | HR 77 | Ht 69.0 in | Wt 168.0 lb

## 2013-06-27 DIAGNOSIS — I4891 Unspecified atrial fibrillation: Secondary | ICD-10-CM

## 2013-06-27 DIAGNOSIS — I701 Atherosclerosis of renal artery: Secondary | ICD-10-CM

## 2013-06-27 DIAGNOSIS — I2510 Atherosclerotic heart disease of native coronary artery without angina pectoris: Secondary | ICD-10-CM

## 2013-06-27 DIAGNOSIS — I739 Peripheral vascular disease, unspecified: Secondary | ICD-10-CM

## 2013-06-27 DIAGNOSIS — Z01812 Encounter for preprocedural laboratory examination: Secondary | ICD-10-CM

## 2013-06-27 DIAGNOSIS — I771 Stricture of artery: Secondary | ICD-10-CM

## 2013-06-27 DIAGNOSIS — I480 Paroxysmal atrial fibrillation: Secondary | ICD-10-CM

## 2013-06-27 DIAGNOSIS — I10 Essential (primary) hypertension: Secondary | ICD-10-CM | POA: Diagnosis not present

## 2013-06-27 DIAGNOSIS — I2589 Other forms of chronic ischemic heart disease: Secondary | ICD-10-CM

## 2013-06-27 MED ORDER — AMLODIPINE BESYLATE 5 MG PO TABS: 5.0000 mg | ORAL_TABLET | Freq: Every day | ORAL | Status: DC

## 2013-06-27 NOTE — Progress Notes (Signed)
Primary care physician: Dr. [name]  HPI  [name] is a pleasant 73 year old gentleman who is here today for a followup visit regarding peripheral arterial disease with previous stenting of the left subclavian artery and left axillary artery and bilateral common iliac artery stenting. The patient has multiple medical problems including hyperlipidemia, long smoking history for 60 years, COPD , carotid showed 40% to 59% bilateral disease, and atherosclerotic coronary artery disease status post CABG in March of 2013 with LIMA to the LAD, vein graft to the PDA, vein graft to the OM. He does have mild aortic valve stenosis.  He was seen in February, 2014 for worsening left subclavian artery stenosis with severe left arm claudication with a 54 mm pressure difference in both arms. I performed aortic arch angiography which showed severe ostial left subclavian artery stenosis and severe left proximal axillary artery stenosis. He underwent successful self-expanding stent placement to the axillary artery balloon expandable stent to the ostial subclavian artery. He was admitted to [facility] in March, 2014 for her headache and was found to have a small intracranial hemorrhage which was suspected due to malignant hypertension and dual antiplatelet therapy. He was treated conservatively and discharged home off antiplatelet medications. Shortly after that he was diagnosed with extensive right-sided DVT. Anticoagulation was contraindicated and thus an IVC filter was placed.  He underwent bilateral common iliac artery stenting in September of 2014 due to lifestyle limiting claudication. Angiography at that time showed significant bilateral renal artery stenosis worse on the left side, significant distal aortic stenosis extending into ostial common iliac artery bilaterally, significant distal right SFA disease with evidence of AV fistula from the proximal popliteal artery. There was diffuse left SFA disease.  His  claudication improved significantly since then with minimal symptoms at this time.  He was hospitalized in January of this year at [facility] due to suspected viral illness and heart failure. He had brief atrial fibrillation in the emergency room. He was readmitted in February to [facility] due to pneumonia and atypical chest pain. He is feeling better overall. Blood pressure has been running high. He did have worsening renal function during his hospitalization but improved at hospital discharge with a creatinine of 1.48.    Allergies  Allergen Reactions  . Plavix [Clopidogrel Bisulfate]     Brain hemorrhage prev while on plavix and aspirin     Current Outpatient Prescriptions on File Prior to Visit  Medication Sig Dispense Refill  . acetaminophen (TYLENOL) 500 MG tablet Take 1,000 mg by mouth every 8 (eight) hours as needed for mild pain.      Marland Kitchen aspirin EC 81 MG tablet Take 81 mg by mouth daily.      Marland Kitchen atorvastatin (LIPITOR) 20 MG tablet Take 20 mg by mouth daily.      . carvedilol (COREG) 3.125 MG tablet Take 3.125 mg by mouth 2 (two) times daily with a meal.      . furosemide (LASIX) 20 MG tablet Take 20 mg by mouth as needed.      Marland Kitchen levothyroxine (SYNTHROID, LEVOTHROID) 200 MCG tablet Take 200 mcg by mouth daily before breakfast.      . [DISCONTINUED] Fluticasone-Salmeterol (ADVAIR DISKUS) 250-50 MCG/DOSE AEPB Inhale 1 puff into the lungs 2 (two) times daily.  1 each  11  . [DISCONTINUED] pravastatin (PRAVACHOL) 40 MG tablet Take 1 tablet (40 mg total) by mouth daily.  30 tablet  6   No current facility-administered medications on file prior to  visit.     Past Medical History  Diagnosis Date  . Venous insufficiency   . Peripheral arterial disease     a. Previous left lower extremity stenting by Dr. Jamal Collin;  b. 12/2012 s/p bilat ostial common iliac stenting.  . Tobacco abuse   . COPD (chronic obstructive pulmonary disease)   . Hypothyroidism   . Subclavian artery stenosis,  left 05/2012    Status post stenting of the ostium and self-expanding stent placement to the left axillary artery  . Right leg DVT 08/13/2012  . Intraventricular hemorrhage 07/12/2012  . Hypertension   . CHF (congestive heart failure)   . Arthritis     "hands; not dx'd" (12/26/2012)  . Bilateral renal artery stenosis   . Aortic calcification 05/01/2013  . Pneumonia      Past Surgical History  Procedure Laterality Date  . Angioplasty / stenting femoral  05/2012  . Coronary artery bypass graft  07/11/2011    Procedure: CORONARY ARTERY BYPASS GRAFTING (CABG);  Surgeon: Grace Isaac, MD;  Location: [place];  Service: Open Heart Surgery;  Laterality: N/A;  Times 3. On Pump. Using right greater saphenous vein and left internal mammary artery.   . Cardiac catheterization  2/14    [facility]  . Subclavian artery stent  05/2012    "2 stents" (12/26/2012)  . Vena cava filter placement  07/2012    Removable  . Angioplasty / stenting iliac Bilateral 12/26/2012  . Cholecystectomy  1990's  . Hernia repair       Family History  Problem Relation Age of Onset  . Alcohol abuse Father   . Cirrhosis Father   . Hypothyroidism Sister      History   Social History  . Marital Status: Married    Spouse Name: N/A    Number of Children: 2  . Years of Education: N/A   Occupational History  . Retired     Land   Social History Main Topics  . Smoking status: Former Smoker -- 1.00 packs/day for 50 years    Types: Cigarettes    Quit date: 04/27/2011  . Smokeless tobacco: Never Used  . Alcohol Use: No  . Drug Use: No  . Sexual Activity: Not Currently   Other Topics Concern  . Not on file   Social History Narrative   Exercsiing 3 times a week.    Moderate diet control.    O living will, no HCPOA.      PHYSICAL EXAM   BP 166/80  Pulse 77  Ht 5\' 9"  (1.753 m)  Wt 168 lb (76.204 kg)  BMI 24.80 kg/m2 Constitutional: He is oriented to person, place, and time. He appears  well-developed and well-nourished. No distress.  HENT: No nasal discharge.  Head: Normocephalic and atraumatic.  Eyes: Pupils are equal and round. Right eye exhibits no discharge. Left eye exhibits no discharge.  Neck: Normal range of motion. Neck supple. No JVD present. No thyromegaly present. There is left carotid bruit. There is a bruit in the left subclavian artery area. Cardiovascular: Normal rate, regular rhythm, normal heart sounds and. Exam reveals no gallop and no friction rub. There is 2/6 systolic ejection murmur at the aortic area.  Pulmonary/Chest: Effort normal and breath sounds normal. No stridor. No respiratory distress. He has no wheezes. He has no rales. He exhibits no tenderness.  Abdominal: Soft. Bowel sounds are normal. He exhibits no distension. There is no tenderness. There is no rebound and no guarding. There is a  bruit on the right side  Musculoskeletal: Normal range of motion. He exhibits no edema and no tenderness.  Neurological: He is alert and oriented to person, place, and time. Coordination normal.  Skin: Skin is warm and dry. No rash noted. He is not diaphoretic. No erythema. No pallor.  Psychiatric: He has a normal mood and affect. His behavior is normal. Judgment and thought content normal.  Vascular: Femoral pulses +1 on the right side and +2 on the left side. Radial pulse: +2 on the right side and +1 on the left side   EKG: Normal sinus rhythm with occasional PVCs and T wave changes in the inferior leads.   ASSESSMENT AND PLAN

## 2013-06-27 NOTE — Assessment & Plan Note (Signed)
The patient is clearly having worsening hypertension and heart failure. He also had recent worsening of chronic kidney disease. Due to all of that, there is a strong indication to proceed with revascularization of bilateral renal artery stenosis. The stenosis is worse on the left side than the right side with smaller kidney size. Thus, the plan is to perform stenting on the left renal artery. Risks and benefits were discussed with the patient and his wife. He will require Plavix for one month.

## 2013-06-27 NOTE — Assessment & Plan Note (Signed)
Blood pressure is elevated. I asked him to resume amlodipine at a lower dose of 5 mg once daily.

## 2013-06-27 NOTE — Assessment & Plan Note (Signed)
His claudication improved significantly after bilateral common iliac artery stenting.His claudication improved significantly after bilateral iliac artery stenting. He still has significant SFA disease bilaterally which is being treated medically.

## 2013-06-27 NOTE — Assessment & Plan Note (Signed)
He has no symptoms suggestive of angina. Continue medical therapy. 

## 2013-06-27 NOTE — Patient Instructions (Signed)
Please follow instructions on your letter for your procedure.   Your physician has recommended you make the following change in your medication:  Amlodipine 5 mg daily

## 2013-06-27 NOTE — Assessment & Plan Note (Signed)
Left arm claudication resolved after stenting. Left radial pulse is mildly diminished.

## 2013-06-28 LAB — CBC WITH DIFFERENTIAL
Basophils Absolute: 0 10*3/uL (ref 0.0–0.2)
Basos: 0 %
EOS ABS: 0.3 10*3/uL (ref 0.0–0.4)
Eos: 5 %
HCT: 43.6 % (ref 37.5–51.0)
Hemoglobin: 14.4 g/dL (ref 12.6–17.7)
Immature Grans (Abs): 0 10*3/uL (ref 0.0–0.1)
Immature Granulocytes: 0 %
LYMPHS ABS: 2 10*3/uL (ref 0.7–3.1)
Lymphs: 31 %
MCH: 28.7 pg (ref 26.6–33.0)
MCHC: 33 g/dL (ref 31.5–35.7)
MCV: 87 fL (ref 79–97)
MONOS ABS: 0.6 10*3/uL (ref 0.1–0.9)
Monocytes: 10 %
Neutrophils Absolute: 3.5 10*3/uL (ref 1.4–7.0)
Neutrophils Relative %: 54 %
PLATELETS: 204 10*3/uL (ref 150–379)
RBC: 5.01 x10E6/uL (ref 4.14–5.80)
RDW: 14.1 % (ref 12.3–15.4)
WBC: 6.5 10*3/uL (ref 3.4–10.8)

## 2013-06-28 LAB — BASIC METABOLIC PANEL
BUN / CREAT RATIO: 17 (ref 10–22)
BUN: 22 mg/dL (ref 8–27)
CO2: 22 mmol/L (ref 18–29)
Calcium: 9.4 mg/dL (ref 8.6–10.2)
Chloride: 105 mmol/L (ref 97–108)
Creatinine, Ser: 1.26 mg/dL (ref 0.76–1.27)
GFR calc Af Amer: 65 mL/min/{1.73_m2} (ref >59)
GFR, EST NON AFRICAN AMERICAN: 57 mL/min/{1.73_m2} — AB (ref >59)
Glucose: 86 mg/dL (ref 65–99)
POTASSIUM: 5.2 mmol/L (ref 3.5–5.2)
SODIUM: 142 mmol/L (ref 134–144)

## 2013-06-28 LAB — PROTIME-INR
INR: 1.2 (ref 0.8–1.2)
Prothrombin Time: 12.3 s — ABNORMAL HIGH (ref 9.1–12.0)

## 2013-07-01 ENCOUNTER — Telehealth: Payer: Self-pay | Admitting: *Deleted

## 2013-07-01 NOTE — Telephone Encounter (Signed)
Per Dr. Fletcher Anon he wants to wait until the patient's kidneys improve before we schedule the carotid doppler.

## 2013-07-01 NOTE — Telephone Encounter (Signed)
Lm w/ wife. Time to schedule carotid doppler. Informed that patient is having a stent on 07/10/13 and renal level aren't good at this time.

## 2013-07-10 ENCOUNTER — Encounter (HOSPITAL_COMMUNITY)
Admission: RE | Disposition: A | Discharge status: Home / Self Care | Payer: Self-pay | Source: Ambulatory Visit | Attending: Cardiovascular Disease

## 2013-07-10 ENCOUNTER — Encounter (HOSPITAL_COMMUNITY): Payer: Self-pay | Admitting: General Practice

## 2013-07-10 ENCOUNTER — Ambulatory Visit (HOSPITAL_COMMUNITY)
Admission: RE | Admit: 2013-07-10 | Discharge: 2013-07-11 | Disposition: A | Discharge status: Home / Self Care | Payer: Medicare Other | Source: Ambulatory Visit | Attending: Cardiovascular Disease | Admitting: Cardiovascular Disease

## 2013-07-10 DIAGNOSIS — E7800 Pure hypercholesterolemia, unspecified: Secondary | ICD-10-CM | POA: Diagnosis present

## 2013-07-10 DIAGNOSIS — I10 Essential (primary) hypertension: Secondary | ICD-10-CM | POA: Diagnosis present

## 2013-07-10 DIAGNOSIS — I129 Hypertensive chronic kidney disease with stage 1 through stage 4 chronic kidney disease, or unspecified chronic kidney disease: Secondary | ICD-10-CM | POA: Insufficient documentation

## 2013-07-10 DIAGNOSIS — I76 Septic arterial embolism: Secondary | ICD-10-CM | POA: Insufficient documentation

## 2013-07-10 DIAGNOSIS — I2510 Atherosclerotic heart disease of native coronary artery without angina pectoris: Secondary | ICD-10-CM | POA: Insufficient documentation

## 2013-07-10 DIAGNOSIS — Z86718 Personal history of other venous thrombosis and embolism: Secondary | ICD-10-CM | POA: Insufficient documentation

## 2013-07-10 DIAGNOSIS — Z951 Presence of aortocoronary bypass graft: Secondary | ICD-10-CM | POA: Diagnosis not present

## 2013-07-10 DIAGNOSIS — E785 Hyperlipidemia, unspecified: Secondary | ICD-10-CM | POA: Diagnosis not present

## 2013-07-10 DIAGNOSIS — I701 Atherosclerosis of renal artery: Secondary | ICD-10-CM | POA: Insufficient documentation

## 2013-07-10 DIAGNOSIS — I255 Ischemic cardiomyopathy: Secondary | ICD-10-CM | POA: Diagnosis present

## 2013-07-10 DIAGNOSIS — I509 Heart failure, unspecified: Secondary | ICD-10-CM | POA: Diagnosis not present

## 2013-07-10 DIAGNOSIS — I359 Nonrheumatic aortic valve disorder, unspecified: Secondary | ICD-10-CM | POA: Insufficient documentation

## 2013-07-10 DIAGNOSIS — N183 Chronic kidney disease, stage 3 unspecified: Secondary | ICD-10-CM | POA: Diagnosis not present

## 2013-07-10 DIAGNOSIS — I2589 Other forms of chronic ischemic heart disease: Secondary | ICD-10-CM | POA: Diagnosis not present

## 2013-07-10 DIAGNOSIS — Z8673 Personal history of transient ischemic attack (TIA), and cerebral infarction without residual deficits: Secondary | ICD-10-CM | POA: Insufficient documentation

## 2013-07-10 DIAGNOSIS — E039 Hypothyroidism, unspecified: Secondary | ICD-10-CM | POA: Insufficient documentation

## 2013-07-10 DIAGNOSIS — Z7982 Long term (current) use of aspirin: Secondary | ICD-10-CM | POA: Diagnosis not present

## 2013-07-10 DIAGNOSIS — I150 Renovascular hypertension: Secondary | ICD-10-CM | POA: Insufficient documentation

## 2013-07-10 DIAGNOSIS — J449 Chronic obstructive pulmonary disease, unspecified: Secondary | ICD-10-CM | POA: Diagnosis present

## 2013-07-10 DIAGNOSIS — Z87891 Personal history of nicotine dependence: Secondary | ICD-10-CM | POA: Insufficient documentation

## 2013-07-10 DIAGNOSIS — J4489 Other specified chronic obstructive pulmonary disease: Secondary | ICD-10-CM | POA: Insufficient documentation

## 2013-07-10 HISTORY — PX: RENAL ARTERY ANGIOPLASTY: SHX2317

## 2013-07-10 HISTORY — DX: Atherosclerotic heart disease of native coronary artery without angina pectoris: I25.10

## 2013-07-10 HISTORY — PX: ABDOMINAL AORTAGRAM: SHX5454

## 2013-07-10 HISTORY — PX: ABDOMINAL ANGIOGRAM: SHX5705

## 2013-07-10 LAB — POCT ACTIVATED CLOTTING TIME
Activated Clotting Time: 227 seconds
Activated Clotting Time: 199 seconds
Activated Clotting Time: 155 seconds

## 2013-07-10 SURGERY — ABDOMINAL AORTAGRAM
Anesthesia: LOCAL

## 2013-07-10 MED ORDER — FUROSEMIDE 20 MG PO TABS
20.0000 mg | ORAL_TABLET | Freq: Every day | ORAL | Status: DC | PRN
  Filled 2013-07-10: qty 1

## 2013-07-10 MED ORDER — CARVEDILOL 3.125 MG PO TABS
3.1250 mg | ORAL_TABLET | Freq: Two times a day (BID) | ORAL | Status: DC
  Administered 2013-07-10 – 2013-07-11 (×2): 3.125 mg via ORAL
  Filled 2013-07-10 (×3): qty 1

## 2013-07-10 MED ORDER — CLOPIDOGREL BISULFATE 300 MG PO TABS
ORAL_TABLET | ORAL | Status: AC
  Filled 2013-07-10: qty 2

## 2013-07-10 MED ORDER — SODIUM CHLORIDE 0.9 % IV SOLN
INTRAVENOUS | Status: DC
  Administered 2013-07-10: 07:00:00 via INTRAVENOUS

## 2013-07-10 MED ORDER — ASPIRIN 81 MG PO CHEW: 81.0000 mg | CHEWABLE_TABLET | ORAL | Status: DC

## 2013-07-10 MED ORDER — AMLODIPINE BESYLATE 5 MG PO TABS
5.0000 mg | ORAL_TABLET | Freq: Every day | ORAL | Status: DC
  Administered 2013-07-11: 5 mg via ORAL
  Filled 2013-07-10: qty 1

## 2013-07-10 MED ORDER — HEPARIN (PORCINE) IN NACL 2-0.9 UNIT/ML-% IJ SOLN
INTRAMUSCULAR | Status: AC
  Filled 2013-07-10: qty 1000

## 2013-07-10 MED ORDER — ATORVASTATIN CALCIUM 20 MG PO TABS
20.0000 mg | ORAL_TABLET | Freq: Every day | ORAL | Status: DC
  Administered 2013-07-10 – 2013-07-11 (×2): 20 mg via ORAL
  Filled 2013-07-10 (×2): qty 1

## 2013-07-10 MED ORDER — SODIUM CHLORIDE 0.9 % IV SOLN
INTRAVENOUS | Status: AC
  Administered 2013-07-10: 11:00:00 via INTRAVENOUS

## 2013-07-10 MED ORDER — LEVOTHYROXINE SODIUM 200 MCG PO TABS
200.0000 ug | ORAL_TABLET | Freq: Every day | ORAL | Status: DC
  Administered 2013-07-11: 06:00:00 200 ug via ORAL
  Filled 2013-07-10 (×2): qty 1

## 2013-07-10 MED ORDER — ACETAMINOPHEN 500 MG PO TABS: 1000.0000 mg | ORAL_TABLET | Freq: Three times a day (TID) | ORAL | Status: DC | PRN

## 2013-07-10 MED ORDER — MIDAZOLAM HCL 2 MG/2ML IJ SOLN
INTRAMUSCULAR | Status: AC
  Filled 2013-07-10: qty 2

## 2013-07-10 MED ORDER — SODIUM CHLORIDE 0.9 % IV SOLN: 250.0000 mL | INTRAVENOUS | Status: DC | PRN

## 2013-07-10 MED ORDER — SODIUM CHLORIDE 0.9 % IJ SOLN: 3.0000 mL | INTRAMUSCULAR | Status: DC | PRN

## 2013-07-10 MED ORDER — FENTANYL CITRATE 0.05 MG/ML IJ SOLN
INTRAMUSCULAR | Status: AC
  Filled 2013-07-10: qty 2

## 2013-07-10 MED ORDER — SODIUM CHLORIDE 0.9 % IJ SOLN: 3.0000 mL | Freq: Two times a day (BID) | INTRAMUSCULAR | Status: DC

## 2013-07-10 MED ORDER — LIDOCAINE HCL (PF) 1 % IJ SOLN
INTRAMUSCULAR | Status: AC
  Filled 2013-07-10: qty 30

## 2013-07-10 MED ORDER — HEPARIN SODIUM (PORCINE) 1000 UNIT/ML IJ SOLN
INTRAMUSCULAR | Status: AC
  Filled 2013-07-10: qty 1

## 2013-07-10 MED ORDER — CLOPIDOGREL BISULFATE 75 MG PO TABS
75.0000 mg | ORAL_TABLET | Freq: Every day | ORAL | Status: DC
  Administered 2013-07-11: 09:00:00 75 mg via ORAL
  Filled 2013-07-10: qty 1

## 2013-07-10 NOTE — Progress Notes (Signed)
Site area: right groin  Site Prior to Removal:  Level 0  Pressure Applied For 20 MINUTES    Minutes Beginning at 1140  Manual:   yes  Patient Status During Pull:  stable  Post Pull Groin Site:  Level 0  Post Pull Instructions Given:  yes  Post Pull Pulses Present:  yes  Dressing Applied:  yes  Comments:

## 2013-07-10 NOTE — CV Procedure (Addendum)
PERIPHERAL VASCULAR PROCEDURE  NAME:  [name]   MRN: [id number] DOB:  12-04-1940   ADMIT DATE: 07/10/2013  Performing Cardiologist: [name] Primary Physician: Owens Loffler, MD  Procedures Performed:  Abdominal Aortic Angiogram without Bi-femoral Runoff  Bilateral iliac angiography  Selective left renal artery angiography  Left renal artery angioplasty and stent placement    Indication(s):   Renal artery stenosis with heart failure and chronic kidney disease.   Consent: The procedure with Risks/Benefits/Alternatives and Indications was reviewed with the patient .  All questions were answered.  Medications:  Sedation:  2 mg IV Versed, 50 mcg IV Fentanyl  Contrast:  70 mL  Visipaque   Procedural details: The right groin was prepped, draped, and anesthetized with 1% lidocaine. Using modified Seldinger technique, a 4 French micropuncture sheath was introduced into the right common femoral artery. There was significant calcifications in the right common femoral artery. This was exchanged into a 6 [place] sheath. A 5 Fr Short Pigtail Catheter was advanced of over a  Versicore wire into the descending Aorta to a level just above the renal arteries. A power injection of 28ml/sec contrast over 1 sec was performed for Abdominal Aortic Angiography.  The catheter was then pulled back to a level just above the Aortic bifurcation, and a second power injection was performed to evaluate the iliac arteries.    Interventional Procedure:  The patient was given 5000 units of unfractionated heparin with an ACT of 227. A 6 French [facility] guide was used to engage the left renal artery. Selective angiography was performed. The lesion was crossed with a [place] core wire. It was predilated with a 4 x 15 mm noncompliant currently balloon to 6 atmospheres. It was stented with a 5 x 18 mm Herculink stent to 12 atmosphere. The balloon was pulled back a few millimeters into the aorta and again  inflated to 14 atmospheres. Final angiography showed excellent results with 0% residual stenosis and no evidence of dissection. The guiding catheter was removed over the versicore wire. The sheath will be removed manually.  Hemodynamics:  Central Aortic Pressure / Mean Aortic Pressure: 142/58  Findings:  Abdominal aorta: Small aneurysm of the infrarenal aorta is noted with moderate to severe diffuse atherosclerosis. An IVC filter is noted.  Left renal artery: Normal in size with 95% proximal stenosis  Right renal artery: Normal in size with a 60% ostial stenosis.  Celiac artery: Patent  Superior mesenteric artery: Patent  Right common iliac artery: Bilateral kissing stents are noted in the common iliac extending into the distal aorta. The 2 stents are patent with minimal restenosis.  Right internal iliac artery: Patent with 70% ostial stenosis.  Right external iliac artery: Minor irregularities.  Left common iliac artery:  Patent stent with minor restenosis.  Left internal iliac artery: 60-70% ostial stenosis.  Left external iliac artery: Minor irregularities.   Conclusions: 1. Severe left renal artery stenosis and borderline significant 60% stenosis in the right renal artery. 2. Patent bilateral common iliac artery stents. 3. Successful angioplasty and bare-metal stent placement to the left renal artery.  Recommendations:  Observe overnight with hydration to monitor blood pressure. Given previous history of intracranial hemorrhage, I will plan on treating him with Plavix alone without aspirin. He did have rash in the past while taking Plavix but it happened few months after he has been on and thus it was not clear if it was due to Plavix.   [name], MD, [facility] 07/10/2013 10:02  AM    

## 2013-07-10 NOTE — H&P (View-Only) (Signed)
Primary care physician: Dr. [name]  HPI  [name] is a pleasant 73 year old gentleman who is here today for a followup visit regarding peripheral arterial disease with previous stenting of the left subclavian artery and left axillary artery and bilateral common iliac artery stenting. The patient has multiple medical problems including hyperlipidemia, long smoking history for 60 years, COPD , carotid showed 40% to 59% bilateral disease, and atherosclerotic coronary artery disease status post CABG in March of 2013 with LIMA to the LAD, vein graft to the PDA, vein graft to the OM. He does have mild aortic valve stenosis.  He was seen in February, 2014 for worsening left subclavian artery stenosis with severe left arm claudication with a 54 mm pressure difference in both arms. I performed aortic arch angiography which showed severe ostial left subclavian artery stenosis and severe left proximal axillary artery stenosis. He underwent successful self-expanding stent placement to the axillary artery balloon expandable stent to the ostial subclavian artery. He was admitted to [facility] in March, 2014 for her headache and was found to have a small intracranial hemorrhage which was suspected due to malignant hypertension and dual antiplatelet therapy. He was treated conservatively and discharged home off antiplatelet medications. Shortly after that he was diagnosed with extensive right-sided DVT. Anticoagulation was contraindicated and thus an IVC filter was placed.  He underwent bilateral common iliac artery stenting in September of 2014 due to lifestyle limiting claudication. Angiography at that time showed significant bilateral renal artery stenosis worse on the left side, significant distal aortic stenosis extending into ostial common iliac artery bilaterally, significant distal right SFA disease with evidence of AV fistula from the proximal popliteal artery. There was diffuse left SFA disease.  His  claudication improved significantly since then with minimal symptoms at this time.  He was hospitalized in January of this year at [facility] due to suspected viral illness and heart failure. He had brief atrial fibrillation in the emergency room. He was readmitted in February to [facility] due to pneumonia and atypical chest pain. He is feeling better overall. Blood pressure has been running high. He did have worsening renal function during his hospitalization but improved at hospital discharge with a creatinine of 1.48.    Allergies  Allergen Reactions  . Plavix [Clopidogrel Bisulfate]     Brain hemorrhage prev while on plavix and aspirin     Current Outpatient Prescriptions on File Prior to Visit  Medication Sig Dispense Refill  . acetaminophen (TYLENOL) 500 MG tablet Take 1,000 mg by mouth every 8 (eight) hours as needed for mild pain.      Marland Kitchen aspirin EC 81 MG tablet Take 81 mg by mouth daily.      Marland Kitchen atorvastatin (LIPITOR) 20 MG tablet Take 20 mg by mouth daily.      . carvedilol (COREG) 3.125 MG tablet Take 3.125 mg by mouth 2 (two) times daily with a meal.      . furosemide (LASIX) 20 MG tablet Take 20 mg by mouth as needed.      Marland Kitchen levothyroxine (SYNTHROID, LEVOTHROID) 200 MCG tablet Take 200 mcg by mouth daily before breakfast.      . [DISCONTINUED] Fluticasone-Salmeterol (ADVAIR DISKUS) 250-50 MCG/DOSE AEPB Inhale 1 puff into the lungs 2 (two) times daily.  1 each  11  . [DISCONTINUED] pravastatin (PRAVACHOL) 40 MG tablet Take 1 tablet (40 mg total) by mouth daily.  30 tablet  6   No current facility-administered medications on file prior to  visit.     Past Medical History  Diagnosis Date  . Venous insufficiency   . Peripheral arterial disease     a. Previous left lower extremity stenting by Dr. Jamal Collin;  b. 12/2012 s/p bilat ostial common iliac stenting.  . Tobacco abuse   . COPD (chronic obstructive pulmonary disease)   . Hypothyroidism   . Subclavian artery stenosis,  left 05/2012    Status post stenting of the ostium and self-expanding stent placement to the left axillary artery  . Right leg DVT 08/13/2012  . Intraventricular hemorrhage 07/12/2012  . Hypertension   . CHF (congestive heart failure)   . Arthritis     "hands; not dx'd" (12/26/2012)  . Bilateral renal artery stenosis   . Aortic calcification 05/01/2013  . Pneumonia      Past Surgical History  Procedure Laterality Date  . Angioplasty / stenting femoral  05/2012  . Coronary artery bypass graft  07/11/2011    Procedure: CORONARY ARTERY BYPASS GRAFTING (CABG);  Surgeon: Grace Isaac, MD;  Location: [place];  Service: Open Heart Surgery;  Laterality: N/A;  Times 3. On Pump. Using right greater saphenous vein and left internal mammary artery.   . Cardiac catheterization  2/14    [facility]  . Subclavian artery stent  05/2012    "2 stents" (12/26/2012)  . Vena cava filter placement  07/2012    Removable  . Angioplasty / stenting iliac Bilateral 12/26/2012  . Cholecystectomy  1990's  . Hernia repair       Family History  Problem Relation Age of Onset  . Alcohol abuse Father   . Cirrhosis Father   . Hypothyroidism Sister      History   Social History  . Marital Status: Married    Spouse Name: N/A    Number of Children: 2  . Years of Education: N/A   Occupational History  . Retired     Land   Social History Main Topics  . Smoking status: Former Smoker -- 1.00 packs/day for 50 years    Types: Cigarettes    Quit date: 04/27/2011  . Smokeless tobacco: Never Used  . Alcohol Use: No  . Drug Use: No  . Sexual Activity: Not Currently   Other Topics Concern  . Not on file   Social History Narrative   Exercsiing 3 times a week.    Moderate diet control.    O living will, no HCPOA.      PHYSICAL EXAM   BP 166/80  Pulse 77  Ht 5\' 9"  (1.753 m)  Wt 168 lb (76.204 kg)  BMI 24.80 kg/m2 Constitutional: He is oriented to person, place, and time. He appears  well-developed and well-nourished. No distress.  HENT: No nasal discharge.  Head: Normocephalic and atraumatic.  Eyes: Pupils are equal and round. Right eye exhibits no discharge. Left eye exhibits no discharge.  Neck: Normal range of motion. Neck supple. No JVD present. No thyromegaly present. There is left carotid bruit. There is a bruit in the left subclavian artery area. Cardiovascular: Normal rate, regular rhythm, normal heart sounds and. Exam reveals no gallop and no friction rub. There is 2/6 systolic ejection murmur at the aortic area.  Pulmonary/Chest: Effort normal and breath sounds normal. No stridor. No respiratory distress. He has no wheezes. He has no rales. He exhibits no tenderness.  Abdominal: Soft. Bowel sounds are normal. He exhibits no distension. There is no tenderness. There is no rebound and no guarding. There is a  bruit on the right side  Musculoskeletal: Normal range of motion. He exhibits no edema and no tenderness.  Neurological: He is alert and oriented to person, place, and time. Coordination normal.  Skin: Skin is warm and dry. No rash noted. He is not diaphoretic. No erythema. No pallor.  Psychiatric: He has a normal mood and affect. His behavior is normal. Judgment and thought content normal.  Vascular: Femoral pulses +1 on the right side and +2 on the left side. Radial pulse: +2 on the right side and +1 on the left side   EKG: Normal sinus rhythm with occasional PVCs and T wave changes in the inferior leads.   ASSESSMENT AND PLAN

## 2013-07-10 NOTE — Interval H&P Note (Signed)
History and Physical Interval Note:  07/10/2013 8:40 AM  [name]  has presented today for surgery, with the diagnosis of renal artery stenosis  The various methods of treatment have been discussed with the patient and family. After consideration of risks, benefits and other options for treatment, the patient has consented to  Procedure(s): ABDOMINAL AORTAGRAM (N/A) as a surgical intervention .  The patient's history has been reviewed, patient examined, no change in status, stable for surgery.  I have reviewed the patient's chart and labs.  Questions were answered to the patient's satisfaction.     [name]

## 2013-07-11 ENCOUNTER — Encounter (HOSPITAL_COMMUNITY): Payer: Self-pay | Admitting: Nurse Practitioner

## 2013-07-11 DIAGNOSIS — I701 Atherosclerosis of renal artery: Secondary | ICD-10-CM

## 2013-07-11 LAB — BASIC METABOLIC PANEL
BUN: 18 mg/dL (ref 6–23)
CO2: 23 mEq/L (ref 19–32)
Calcium: 8.8 mg/dL (ref 8.4–10.5)
Chloride: 106 mEq/L (ref 96–112)
Creatinine, Ser: 1.16 mg/dL (ref 0.50–1.35)
GFR calc Af Amer: 71 mL/min — ABNORMAL LOW (ref >90)
GFR calc non Af Amer: 61 mL/min — ABNORMAL LOW (ref >90)
GLUCOSE: 86 mg/dL (ref 70–99)
POTASSIUM: 4 meq/L (ref 3.7–5.3)
Sodium: 141 mEq/L (ref 137–147)

## 2013-07-11 MED ORDER — CLOPIDOGREL BISULFATE 75 MG PO TABS: 75.0000 mg | ORAL_TABLET | Freq: Every day | ORAL | Status: DC

## 2013-07-11 NOTE — Discharge Summary (Signed)
Discharge Summary   Patient ID: [name],  MRN: [id number], DOB/AGE: [birth year] 72 y.o.  Admit date: 07/10/2013 Discharge date: 07/11/2013  Primary Care Provider: Frederico Hamman Copland Primary Cardiologist: Jerilynn Mages. Fletcher Anon, MD   Discharge Diagnoses Principal Problem:   RAS (renal artery stenosis)  **s/p PTA and stenting of the left renal artery this admission.  Active Problems:   Ischemic cardiomyopathy   HTN (hypertension)   CKD (chronic kidney disease) stage 3, GFR 30-59 ml/min   HYPOTHYROIDISM NOS   HYPERCHOLESTEROLEMIA   COPD (chronic obstructive pulmonary disease)   CAD (coronary artery disease), native coronary artery  Allergies Allergies  Allergen Reactions  . Plavix [Clopidogrel Bisulfate]     Brain hemorrhage prev while on plavix and aspirin   Procedures  Peripheral Angiography and Renal Artery Stenting 3.18.2015  Hemodynamics:             Central Aortic Pressure / Mean Aortic Pressure: 142/58  Findings:  Abdominal aorta: Small aneurysm of the renal arteries is noted with moderate to severe diffuse atherosclerosis. An IVC filter is noted.  Left renal artery: Normal in size with 95% proximal stenosis  **The Left renal artery was successfully stented using a 5.0 x 18 mm Herculink Stent.**   Right renal artery: Normal in size with a 60% ostial stenosis.  Celiac artery: Patent  Superior mesenteric artery: Patent  Right common iliac artery: Bilateral kissing stents are noted in the common iliac extending into the distal aorta. The 2 stents are patent with minimal restenosis.  Right internal iliac artery: Patent with 70% ostial stenosis.  Right external iliac artery: Minor irregularities.  Left common iliac artery:  Patent stent with minor restenosis.  Left internal iliac artery: 60-70% ostial stenosis.  Left external iliac artery: Minor irregularities.  Conclusions: 1. Severe left renal artery stenosis and borderline significant 60% stenosis in the right  renal artery. 2. Patent bilateral common iliac artery stents. 3. Successful angioplasty and bare-metal stent placement to the left renal artery. _____________   History of Present Illness  73 year old male with a prior history of coronary artery disease status post coronary artery bypass grafting, ischemic cardiomyopathy, and peripheral arterial disease status post prior stenting of the left subclavian artery, left axillary artery, and bilateral common iliac arteries. He also has known bilateral renal artery stenosis with significant disease on the left. In the setting of renal artery stenosis, patient has been experiencing elevated blood pressures as well as elevated creatinine. He was recently seen in peripheral vascular clinic on March 5, and it was felt that he would benefit from stenting of the left renal artery.  Hospital Course  Patient presented to the [facility] peripheral vascular laboratory on March 18 and underwent diagnostic angiography revealing a 95% proximal stenosis within the left renal artery and a 60% ostial stenosis in the right renal artery. Previously placed common iliac artery stents were patent. Attention was turned to the right renal artery and this was successfully stented using a 5.0 x 18 mm Herculink stent. Patient tolerated procedure well and post procedure his blood pressures have been stable. Creatinine this morning is 1.16. He will be discharged home today in good condition.  Of note, patient's previously experience a rash after several months of ongoing Plavix therapy though it was not clear that rash was related to Plavix. Further, with his prior history of intracranial hemorrhage, we have chosen to use Plavix alone and he will not be discharged home on aspirin.  Discharge Vitals Blood pressure 139/79, pulse 80,  temperature 98.5 F (36.9 C), temperature source Oral, resp. rate 18, height 5\' 9"  (1.753 m), weight 167 lb 5.3 oz (75.9 kg), SpO2 96.00%.  Filed Weights    07/10/13 0640 07/11/13 0023  Weight: 168 lb (76.204 kg) 167 lb 5.3 oz (75.9 kg)   Labs  Basic Metabolic Panel  Recent Labs  07/11/13 0337  NA 141  K 4.0  CL 106  CO2 23  GLUCOSE 86  BUN 18  CREATININE 1.16  CALCIUM 8.8   Disposition  Pt is being discharged home today in good condition.  Follow-up Plans & Appointments  Follow-up Information   Follow up with [name], MD On 07/15/2013. (9:00 AM)    Specialty:  Cardiology   Contact information:   [address] [place] [address]       Follow up with Owens Loffler, MD. (as scheduled.)    Specialty:  Family Medicine   Contact information:   [address] [place] [address] [phone]      Discharge Medications    Medication List    STOP taking these medications       aspirin EC 81 MG tablet      TAKE these medications       acetaminophen 500 MG tablet  Commonly known as:  TYLENOL  Take 1,000 mg by mouth every 8 (eight) hours as needed for mild pain.     amLODipine 5 MG tablet  Commonly known as:  NORVASC  Take 1 tablet (5 mg total) by mouth daily.     atorvastatin 20 MG tablet  Commonly known as:  LIPITOR  Take 20 mg by mouth daily.     carvedilol 3.125 MG tablet  Commonly known as:  COREG  Take 3.125 mg by mouth 2 (two) times daily with a meal.     clopidogrel 75 MG tablet  Commonly known as:  PLAVIX  Take 1 tablet (75 mg total) by mouth daily with breakfast.     furosemide 20 MG tablet  Commonly known as:  LASIX  Take 20 mg by mouth daily as needed for fluid.     levothyroxine 200 MCG tablet  Commonly known as:  SYNTHROID, LEVOTHROID  Take 200 mcg by mouth daily before breakfast.       Outstanding Labs/Studies  None  Duration of Discharge Encounter   Greater than 30 minutes including physician time.  Signed, Murray Hodgkins NP 07/11/2013, 8:21 AM

## 2013-07-11 NOTE — Discharge Instructions (Signed)

## 2013-07-11 NOTE — Discharge Summary (Signed)
Pt was seen this am. See my full note. cdm

## 2013-07-11 NOTE — Progress Notes (Signed)
     SUBJECTIVE: No chest pain or SOB  BP 139/56  Pulse 76  Temp(Src) 97.6 F (36.4 C) (Oral)  Resp 20  Ht 5\' 9"  (1.753 m)  Wt 167 lb 5.3 oz (75.9 kg)  BMI 24.70 kg/m2  SpO2 96%  Intake/Output Summary (Last 24 hours) at 07/11/13 6010 Last data filed at 07/10/13 2104  Gross per 24 hour  Intake    940 ml  Output   1375 ml  Net   -435 ml    PHYSICAL EXAM General: Well developed, well nourished, in no acute distress. Alert and oriented x 3.  Psych:  Good affect, responds appropriately Neck: No JVD. No masses noted.  Lungs: Clear bilaterally with no wheezes or rhonci noted.  Heart: RRR with no murmurs noted. Abdomen: Bowel sounds are present. Soft, non-tender.  Extremities: No lower extremity edema. Right groin cath site is stable.   LABS: Basic Metabolic Panel:  Recent Labs  07/11/13 0337  NA 141  K 4.0  CL 106  CO2 23  GLUCOSE 86  BUN 18  CREATININE 1.16  CALCIUM 8.8   Current Meds: . amLODipine  5 mg Oral Daily  . atorvastatin  20 mg Oral Daily  . carvedilol  3.125 mg Oral BID WC  . clopidogrel  75 mg Oral Q breakfast  . levothyroxine  200 mcg Oral QAC breakfast     ASSESSMENT AND PLAN:  1. Renal artery stenosis: Pt is s/p left renal artery stent 07/10/13 per Dr. Fletcher Anon. Doing well this am. He will be treated with Plavix 75 mg daily. He will not be treated with ASA per plans of Dr. Fletcher Anon.  D/C home today after ambulation and f/u with Dr. Fletcher Anon in 2-3 weeks.   Topacio Cella  3/19/20156:58 AM

## 2013-07-15 ENCOUNTER — Ambulatory Visit: Payer: Medicare Other | Admitting: Cardiovascular Disease

## 2013-07-23 ENCOUNTER — Telehealth: Payer: Self-pay | Admitting: *Deleted

## 2013-07-23 NOTE — Telephone Encounter (Signed)
Patient has an appt on 08/01/13 with Dr. Arida and he wants to know if he needs his abd aorta + abi before seeing Dr. Arida. 

## 2013-07-23 NOTE — Telephone Encounter (Signed)
Instructed patient to come to his appt on 4/9 No other tests ordered at this time Patient verbalized understanding  I will ask Dr. Fletcher Anon if he needs 1 year f/u AAA duplex before his appt on the 4/9

## 2013-07-28 NOTE — Telephone Encounter (Signed)
No need for testing now. Will review during appointment.

## 2013-07-30 NOTE — Telephone Encounter (Signed)
Informed Traci via telephone call of Dr. Jacklynn Ganong response

## 2013-08-01 ENCOUNTER — Ambulatory Visit (INDEPENDENT_AMBULATORY_CARE_PROVIDER_SITE_OTHER): Payer: Medicare Other | Admitting: Cardiovascular Disease

## 2013-08-01 ENCOUNTER — Encounter: Payer: Self-pay | Admitting: Cardiovascular Disease

## 2013-08-01 VITALS — BP 134/68 | HR 76 | Ht 69.0 in | Wt 170.2 lb

## 2013-08-01 DIAGNOSIS — I10 Essential (primary) hypertension: Secondary | ICD-10-CM | POA: Diagnosis not present

## 2013-08-01 DIAGNOSIS — I4891 Unspecified atrial fibrillation: Secondary | ICD-10-CM | POA: Diagnosis not present

## 2013-08-01 DIAGNOSIS — I701 Atherosclerosis of renal artery: Secondary | ICD-10-CM

## 2013-08-01 DIAGNOSIS — I2510 Atherosclerotic heart disease of native coronary artery without angina pectoris: Secondary | ICD-10-CM

## 2013-08-01 DIAGNOSIS — I480 Paroxysmal atrial fibrillation: Secondary | ICD-10-CM

## 2013-08-01 DIAGNOSIS — I771 Stricture of artery: Secondary | ICD-10-CM

## 2013-08-01 DIAGNOSIS — I739 Peripheral vascular disease, unspecified: Secondary | ICD-10-CM

## 2013-08-01 DIAGNOSIS — I2589 Other forms of chronic ischemic heart disease: Secondary | ICD-10-CM

## 2013-08-01 MED ORDER — CLOPIDOGREL BISULFATE 75 MG PO TABS: ORAL_TABLET | ORAL | Status: DC

## 2013-08-01 MED ORDER — ASPIRIN EC 81 MG PO TBEC: DELAYED_RELEASE_TABLET | ORAL | Status: DC

## 2013-08-01 NOTE — Assessment & Plan Note (Addendum)
Status post recent stenting of the left renal artery. There was 60% stenosis in the right renal artery which will be treated medically for now. Blood pressure is under excellent control. Recommend renal artery duplex in 3 months from now. He reports itching with Plavix but no rash. He had a similar reaction in the past. Continue for one more week and then switch to aspirin 81 mg daily.

## 2013-08-01 NOTE — Progress Notes (Signed)
Primary care physician: Dr. [name]  HPI  [name] is a pleasant 73 year old gentleman who is here today for a followup visit regarding extensive peripheral arterial disease with previous stenting of the left subclavian artery and left axillary artery, bilateral common iliac artery stenting and recent left renal artery stenting. The patient has multiple medical problems including hyperlipidemia, long smoking history for 60 years, COPD , carotid showed 40% to 59% bilateral disease, and atherosclerotic coronary artery disease status post CABG in March of 2013 with LIMA to the LAD, vein graft to the PDA, vein graft to the OM. He does have mild aortic valve stenosis.  He had a small intracranial hemorrhage which was suspected due to malignant hypertension and dual antiplatelet therapy. Shortly after that he was diagnosed with extensive right-sided DVT. Anticoagulation was contraindicated and thus an IVC filter was placed.  He underwent bilateral common iliac artery stenting in September of 2014 due to lifestyle limiting claudication. Angiography at that time showed significant bilateral renal artery stenosis worse on the left side, significant distal aortic stenosis extending into ostial common iliac artery bilaterally, significant distal right SFA disease with evidence of AV fistula from the proximal popliteal artery. There was diffuse left SFA disease.  His claudication improved significantly since then with minimal symptoms at this time.  He had worsening heart failure, hypertension and chronic kidney disease thought to be due to renal artery stenosis. Thus, I proceeded recently with angiography which showed 95% stenosis in the left renal artery and 60% stenosis in the right renal artery. Iliac stents were patent. I performed stenting of the left renal artery without complications. He has been doing very well since then.    Allergies  Allergen Reactions  . Plavix [Clopidogrel Bisulfate]    Brain hemorrhage prev while on plavix and aspirin     Current Outpatient Prescriptions on File Prior to Visit  Medication Sig Dispense Refill  . acetaminophen (TYLENOL) 500 MG tablet Take 1,000 mg by mouth every 8 (eight) hours as needed for mild pain.      Marland Kitchen amLODipine (NORVASC) 5 MG tablet Take 1 tablet (5 mg total) by mouth daily.  30 tablet  6  . atorvastatin (LIPITOR) 20 MG tablet Take 20 mg by mouth daily.      . carvedilol (COREG) 3.125 MG tablet Take 3.125 mg by mouth 2 (two) times daily with a meal.      . clopidogrel (PLAVIX) 75 MG tablet Take 1 tablet (75 mg total) by mouth daily with breakfast.  30 tablet  3  . furosemide (LASIX) 20 MG tablet Take 20 mg by mouth daily as needed for fluid.       Marland Kitchen levothyroxine (SYNTHROID, LEVOTHROID) 200 MCG tablet Take 200 mcg by mouth daily before breakfast.      . [DISCONTINUED] Fluticasone-Salmeterol (ADVAIR DISKUS) 250-50 MCG/DOSE AEPB Inhale 1 puff into the lungs 2 (two) times daily.  1 each  11  . [DISCONTINUED] pravastatin (PRAVACHOL) 40 MG tablet Take 1 tablet (40 mg total) by mouth daily.  30 tablet  6   No current facility-administered medications on file prior to visit.     Past Medical History  Diagnosis Date  . Venous insufficiency   . Peripheral arterial disease     a. Previous left lower extremity stenting by Dr. Jamal Collin;  b. 12/2012 s/p bilat ostial common iliac stenting.  . Tobacco abuse   . COPD (chronic obstructive pulmonary disease)   . Hypothyroidism   . Subclavian artery  stenosis, left 05/2012    Status post stenting of the ostium and self-expanding stent placement to the left axillary artery  . Right leg DVT 08/13/2012  . Intraventricular hemorrhage 07/12/2012  . Hypertension   . CHF (congestive heart failure)   . Arthritis     "hands; not dx'd" (12/26/2012)  . Bilateral renal artery stenosis     a. 06/2013 Angio/PTA: LRA: 95 (5x18 Herculink stent), RRA 60ost, celiac/SMA nl, RCIA patent stents extending into dist Ao,  RIIA 70ost, REIA min irregs, LCIA patent stent, LIIA 60-70ost, LEIA min irregs.  . Aortic calcification 05/01/2013  . Pneumonia   . Coronary artery disease   . Shortness of breath      Past Surgical History  Procedure Laterality Date  . Angioplasty / stenting femoral  05/2012  . Coronary artery bypass graft  07/11/2011    Procedure: CORONARY ARTERY BYPASS GRAFTING (CABG);  Surgeon: Grace Isaac, MD;  Location: [place];  Service: Open Heart Surgery;  Laterality: N/A;  Times 3. On Pump. Using right greater saphenous vein and left internal mammary artery.   . Cardiac catheterization  2/14    [facility]  . Subclavian artery stent  05/2012    "2 stents" (12/26/2012)  . Vena cava filter placement  07/2012    Removable  . Angioplasty / stenting iliac Bilateral 12/26/2012  . Cholecystectomy  1990's  . Hernia repair    . Renal artery angioplasty Left 07/10/2013    DR Yaiden Yang  . Abdominal angiogram  07/10/2013    WITH BI-FEMORAL RUNOFF       DR Kailan Laws     Family History  Problem Relation Age of Onset  . Alcohol abuse Father   . Cirrhosis Father   . Hypothyroidism Sister      History   Social History  . Marital Status: Married    Spouse Name: N/A    Number of Children: 2  . Years of Education: N/A   Occupational History  . Retired     Land   Social History Main Topics  . Smoking status: Former Smoker -- 1.00 packs/day for 50 years    Types: Cigarettes    Quit date: 04/27/2011  . Smokeless tobacco: Never Used  . Alcohol Use: No  . Drug Use: No  . Sexual Activity: Not Currently   Other Topics Concern  . Not on file   Social History Narrative   Exercsiing 3 times a week.    Moderate diet control.    O living will, no HCPOA.      PHYSICAL EXAM   BP 134/68  Pulse 76  Ht 5\' 9"  (1.753 m)  Wt 170 lb 4 oz (77.225 kg)  BMI 25.13 kg/m2 Constitutional: He is oriented to person, place, and time. He appears well-developed and well-nourished. No distress.  HENT: No  nasal discharge.  Head: Normocephalic and atraumatic.  Eyes: Pupils are equal and round. Right eye exhibits no discharge. Left eye exhibits no discharge.  Neck: Normal range of motion. Neck supple. No JVD present. No thyromegaly present. There is left carotid bruit. There is a bruit in the left subclavian artery area. Cardiovascular: Normal rate, regular rhythm, normal heart sounds and. Exam reveals no gallop and no friction rub. There is 2/6 systolic ejection murmur at the aortic area.  Pulmonary/Chest: Effort normal and breath sounds normal. No stridor. No respiratory distress. He has no wheezes. He has no rales. He exhibits no tenderness.  Abdominal: Soft. Bowel sounds are normal. He  exhibits no distension. There is no tenderness. There is no rebound and no guarding. There is a bruit on the right side  Musculoskeletal: Normal range of motion. He exhibits no edema and no tenderness.  Neurological: He is alert and oriented to person, place, and time. Coordination normal.  Skin: Skin is warm and dry. No rash noted. He is not diaphoretic. No erythema. No pallor.  Psychiatric: He has a normal mood and affect. His behavior is normal. Judgment and thought content normal.  Vascular: Femoral pulses +1 on the right side and +2 on the left side. Radial pulse: +2 on the right side and +1 on the left side   no groin hematoma  EKG: Sinus  Rhythm  WITHIN NORMAL LIMITS   ASSESSMENT AND PLAN

## 2013-08-01 NOTE — Patient Instructions (Signed)
Your physician has recommended you make the following change in your medication:  Stop Plavix in 1 week  Start Aspirin 81 mg daily in one week   Your physician has requested that you have a carotid duplex in May. This test is an ultrasound of the carotid arteries in your neck. It looks at blood flow through these arteries that supply the brain with blood. Allow one hour for this exam. There are no restrictions or special instructions.  Your physician has requested that you have a renal artery duplex in July. During this test, an ultrasound is used to evaluate blood flow to the kidneys. Allow one hour for this exam. Do not eat after midnight the day before and avoid carbonated beverages. Take your medications as you usually do.  Your physician wants you to follow-up in: 6 months. You will receive a reminder letter in the mail two months in advance. If you don't receive a letter, please call our office to schedule the follow-up appointment.

## 2013-08-01 NOTE — Assessment & Plan Note (Signed)
He has no symptoms of angina. 

## 2013-08-01 NOTE — Assessment & Plan Note (Signed)
Left arm claudication resolved after stenting. Left radial pulse is slightly diminished. He is due for Doppler evaluation in May.

## 2013-08-01 NOTE — Assessment & Plan Note (Signed)
He reports no claudication. Iliac stents were patent on recent angiography.

## 2013-08-26 ENCOUNTER — Other Ambulatory Visit: Payer: Self-pay | Admitting: Family Medicine

## 2013-08-27 ENCOUNTER — Other Ambulatory Visit (HOSPITAL_COMMUNITY): Payer: Self-pay | Admitting: *Deleted

## 2013-08-27 DIAGNOSIS — I739 Peripheral vascular disease, unspecified: Secondary | ICD-10-CM

## 2013-08-28 ENCOUNTER — Telehealth: Payer: Self-pay | Admitting: *Deleted

## 2013-08-28 NOTE — Telephone Encounter (Signed)
Informed patient that aortoiliac duplex is cancelled  Patient verbalized understanding

## 2013-08-28 NOTE — Telephone Encounter (Signed)
Please call patient's wife as they do not understant why they need the abd aorta. It is tomorrow, please call asap.

## 2013-08-28 NOTE — Telephone Encounter (Signed)
Message copied by Tracie Harrier on Wed Aug 28, 2013  1:07 PM ------      Message from: [name] A      Created: Wed Aug 28, 2013 12:24 PM       No need for aortoiliac duplex because it was checked during renal angiogram.             ----- Message -----         From: Tracie Harrier, RN         Sent: 08/28/2013  10:41 AM           To: Wellington Hampshire, MD            Patient is due for iliac stent check.      It was added on by Olivia Mackie to have after his carotids tomorrow.       Patient wife said you told them verbally after his renal stents were placed that his iliac stents looked good and he did not need the iliac duplex.             Let me know thanks!!!!!!!!!!        ------

## 2013-08-28 NOTE — Telephone Encounter (Signed)
Staff message sent to [facility] 5/6

## 2013-08-29 ENCOUNTER — Encounter (INDEPENDENT_AMBULATORY_CARE_PROVIDER_SITE_OTHER): Payer: Medicare Other

## 2013-08-29 DIAGNOSIS — I6529 Occlusion and stenosis of unspecified carotid artery: Secondary | ICD-10-CM | POA: Diagnosis not present

## 2013-08-29 DIAGNOSIS — I771 Stricture of artery: Secondary | ICD-10-CM

## 2013-08-30 ENCOUNTER — Telehealth: Payer: Self-pay | Admitting: *Deleted

## 2013-08-30 DIAGNOSIS — I6529 Occlusion and stenosis of unspecified carotid artery: Secondary | ICD-10-CM

## 2013-08-30 DIAGNOSIS — I771 Stricture of artery: Secondary | ICD-10-CM

## 2013-08-30 NOTE — Telephone Encounter (Signed)
Message copied by Tracie Harrier on Fri Aug 30, 2013  8:14 AM ------      Message from: [name] A      Created: Thu Aug 29, 2013  3:12 PM       Stable moderate blockages in carotid arteries. The stents in left arm are fine. Recommend a follow up carotid doppler in 1 year. ------

## 2013-12-26 ENCOUNTER — Other Ambulatory Visit (HOSPITAL_COMMUNITY): Payer: Self-pay | Admitting: *Deleted

## 2013-12-26 DIAGNOSIS — L98499 Non-pressure chronic ulcer of skin of other sites with unspecified severity: Secondary | ICD-10-CM

## 2013-12-26 DIAGNOSIS — I739 Peripheral vascular disease, unspecified: Secondary | ICD-10-CM

## 2014-01-03 ENCOUNTER — Encounter (INDEPENDENT_AMBULATORY_CARE_PROVIDER_SITE_OTHER): Payer: Medicare Other

## 2014-01-03 DIAGNOSIS — I739 Peripheral vascular disease, unspecified: Secondary | ICD-10-CM | POA: Diagnosis not present

## 2014-01-03 DIAGNOSIS — I714 Abdominal aortic aneurysm, without rupture, unspecified: Secondary | ICD-10-CM | POA: Diagnosis not present

## 2014-01-03 DIAGNOSIS — I10 Essential (primary) hypertension: Secondary | ICD-10-CM | POA: Diagnosis not present

## 2014-01-03 DIAGNOSIS — I700 Atherosclerosis of aorta: Secondary | ICD-10-CM | POA: Diagnosis not present

## 2014-01-03 DIAGNOSIS — L98499 Non-pressure chronic ulcer of skin of other sites with unspecified severity: Secondary | ICD-10-CM

## 2014-01-13 ENCOUNTER — Other Ambulatory Visit: Payer: Self-pay | Admitting: Cardiovascular Disease

## 2014-01-13 DIAGNOSIS — Z23 Encounter for immunization: Secondary | ICD-10-CM | POA: Diagnosis not present

## 2014-01-30 ENCOUNTER — Encounter: Payer: Self-pay | Admitting: Cardiovascular Disease

## 2014-01-30 ENCOUNTER — Ambulatory Visit (INDEPENDENT_AMBULATORY_CARE_PROVIDER_SITE_OTHER): Payer: Medicare Other | Admitting: Cardiovascular Disease

## 2014-01-30 VITALS — BP 158/53 | HR 68 | Ht 69.0 in | Wt 171.0 lb

## 2014-01-30 DIAGNOSIS — L98499 Non-pressure chronic ulcer of skin of other sites with unspecified severity: Secondary | ICD-10-CM

## 2014-01-30 DIAGNOSIS — I255 Ischemic cardiomyopathy: Secondary | ICD-10-CM | POA: Diagnosis not present

## 2014-01-30 DIAGNOSIS — I10 Essential (primary) hypertension: Secondary | ICD-10-CM | POA: Diagnosis not present

## 2014-01-30 DIAGNOSIS — I701 Atherosclerosis of renal artery: Secondary | ICD-10-CM

## 2014-01-30 DIAGNOSIS — I2510 Atherosclerotic heart disease of native coronary artery without angina pectoris: Secondary | ICD-10-CM | POA: Diagnosis not present

## 2014-01-30 DIAGNOSIS — I480 Paroxysmal atrial fibrillation: Secondary | ICD-10-CM | POA: Diagnosis not present

## 2014-01-30 DIAGNOSIS — E785 Hyperlipidemia, unspecified: Secondary | ICD-10-CM | POA: Diagnosis not present

## 2014-01-30 DIAGNOSIS — E7800 Pure hypercholesterolemia, unspecified: Secondary | ICD-10-CM

## 2014-01-30 DIAGNOSIS — I739 Peripheral vascular disease, unspecified: Secondary | ICD-10-CM | POA: Diagnosis not present

## 2014-01-30 DIAGNOSIS — E038 Other specified hypothyroidism: Secondary | ICD-10-CM

## 2014-01-30 NOTE — Patient Instructions (Signed)
Your physician recommends that you return for lab work in:  Anytime this week or early next week. Please be fasting. TSH  BMP Lipid and Liver panel   Your physician recommends that you schedule a follow-up appointment in:  6 months

## 2014-01-30 NOTE — Progress Notes (Signed)
Primary care physician: Dr. [name]  HPI  [name] is a pleasant 73 year old gentleman who is here today for a followup visit regarding extensive peripheral arterial disease with previous stenting of the left subclavian artery and left axillary artery, bilateral common iliac artery stenting and recent left renal artery stenting. The patient has multiple medical problems including hyperlipidemia, long smoking history for 60 years, COPD , carotid showed 40% to 59% bilateral disease, and atherosclerotic coronary artery disease status post CABG in March of 2013 with LIMA to the LAD, vein graft to the PDA, vein graft to the OM. He does have mild aortic valve stenosis.  He had a small intracranial hemorrhage which was suspected due to malignant hypertension and dual antiplatelet therapy. Shortly after that he was diagnosed with extensive right-sided DVT. Anticoagulation was contraindicated and thus an IVC filter was placed.  He underwent bilateral common iliac artery stenting in September of 2014 due to lifestyle limiting claudication. Angiography at that time showed significant bilateral renal artery stenosis worse on the left side, significant distal aortic stenosis extending into ostial common iliac artery bilaterally, significant distal right SFA disease with evidence of AV fistula from the proximal popliteal artery. There was diffuse left SFA disease.  His claudication improved significantly since then with minimal symptoms at this time.  He had worsening heart failure, hypertension and chronic kidney disease thought to be due to renal artery stenosis. Thus, I proceeded with angiography in 06/2013 which showed 95% stenosis in the left renal artery and 60% stenosis in the right renal artery. Iliac stents were patent. I performed stenting of the left renal artery without complications. He has been doing very well since then. Recent duplex showed patent left renal artery stent with progression of right  ear.restenosis and moderate iliac instent restenosis. He denies claudication.   Allergies  Allergen Reactions  . Plavix [Clopidogrel Bisulfate]     Brain hemorrhage prev while on plavix and aspirin     Current Outpatient Prescriptions on File Prior to Visit  Medication Sig Dispense Refill  . acetaminophen (TYLENOL) 500 MG tablet Take 1,000 mg by mouth every 8 (eight) hours as needed for mild pain.      Marland Kitchen amLODipine (NORVASC) 5 MG tablet Take 1 tablet (5 mg total) by mouth daily.  30 tablet  6  . aspirin EC 81 MG tablet Start 1 tablet daily a week from 08/01/13  90 tablet  3  . atorvastatin (LIPITOR) 20 MG tablet Take 20 mg by mouth daily.      . carvedilol (COREG) 3.125 MG tablet TAKE 1 TABLET TWICE DAILY WITH MEALS  180 tablet  3  . levothyroxine (SYNTHROID, LEVOTHROID) 200 MCG tablet TAKE ONE TABLET BY MOUTH EVERY DAY  30 tablet  7  . [DISCONTINUED] Fluticasone-Salmeterol (ADVAIR DISKUS) 250-50 MCG/DOSE AEPB Inhale 1 puff into the lungs 2 (two) times daily.  1 each  11  . [DISCONTINUED] pravastatin (PRAVACHOL) 40 MG tablet Take 1 tablet (40 mg total) by mouth daily.  30 tablet  6   No current facility-administered medications on file prior to visit.     Past Medical History  Diagnosis Date  . Venous insufficiency   . Peripheral arterial disease     a. Previous left lower extremity stenting by Dr. Jamal [name];  b. 12/2012 s/p bilat ostial common iliac stenting.  . Tobacco abuse   . COPD (chronic obstructive pulmonary disease)   . Hypothyroidism   . Subclavian artery stenosis, left 05/2012  Status post stenting of the ostium and self-expanding stent placement to the left axillary artery  . Right leg DVT 08/13/2012  . Intraventricular hemorrhage 07/12/2012  . Hypertension   . CHF (congestive heart failure)   . Arthritis     "hands; not dx'd" (12/26/2012)  . Bilateral renal artery stenosis     a. 06/2013 Angio/PTA: LRA: 95 (5x18 Herculink stent), RRA 60ost, celiac/SMA nl, RCIA patent  stents extending into dist Ao, RIIA 70ost, REIA min irregs, LCIA patent stent, LIIA 60-70ost, LEIA min irregs.  . Aortic calcification 05/01/2013  . Pneumonia   . Coronary artery disease   . Shortness of breath      Past Surgical History  Procedure Laterality Date  . Angioplasty / stenting femoral  05/2012  . Coronary artery bypass graft  07/11/2011    Procedure: CORONARY ARTERY BYPASS GRAFTING (CABG);  Surgeon: Grace Isaac, MD;  Location: [place];  Service: Open Heart Surgery;  Laterality: N/A;  Times 3. On Pump. Using right greater saphenous vein and left internal mammary artery.   . Cardiac catheterization  2/14    [facility]  . Subclavian artery stent  05/2012    "2 stents" (12/26/2012)  . Vena cava filter placement  07/2012    Removable  . Angioplasty / stenting iliac Bilateral 12/26/2012  . Cholecystectomy  1990's  . Hernia repair    . Renal artery angioplasty Left 07/10/2013    DR ARIDA  . Abdominal angiogram  07/10/2013    WITH BI-FEMORAL RUNOFF       DR ARIDA     Family History  Problem Relation Age of Onset  . Alcohol abuse Father   . Cirrhosis Father   . Hypothyroidism Sister      History   Social History  . Marital Status: Married    Spouse Name: N/A    Number of Children: 2  . Years of Education: N/A   Occupational History  . Retired     Land   Social History Main Topics  . Smoking status: Former Smoker -- 1.00 packs/day for 50 years    Types: Cigarettes    Quit date: 04/27/2011  . Smokeless tobacco: Never Used  . Alcohol Use: No  . Drug Use: No  . Sexual Activity: Not Currently   Other Topics Concern  . Not on file   Social History Narrative   Exercsiing 3 times a week.    Moderate diet control.    O living will, no HCPOA.      PHYSICAL EXAM   BP 158/53  Pulse 68  Ht 5\' 9"  (1.753 m)  Wt 171 lb (77.565 kg)  BMI 25.24 kg/m2 Constitutional: He is oriented to person, place, and time. He appears well-developed and well-nourished. No  distress.  HENT: No nasal discharge.  Head: Normocephalic and atraumatic.  Eyes: Pupils are equal and round. Right eye exhibits no discharge. Left eye exhibits no discharge.  Neck: Normal range of motion. Neck supple. No JVD present. No thyromegaly present. There is left carotid bruit. There is a bruit in the left subclavian artery area. Cardiovascular: Normal rate, regular rhythm, normal heart sounds and. Exam reveals no gallop and no friction rub. There is 2/6 systolic ejection murmur at the aortic area.  Pulmonary/Chest: Effort normal and breath sounds normal. No stridor. No respiratory distress. He has no wheezes. He has no rales. He exhibits no tenderness.  Abdominal: Soft. Bowel sounds are normal. He exhibits no distension. There is no tenderness. There  is no rebound and no guarding. There is a bruit on the right side  Musculoskeletal: Normal range of motion. He exhibits no edema and no tenderness.  Neurological: He is alert and oriented to person, place, and time. Coordination normal.  Skin: Skin is warm and dry. No rash noted. He is not diaphoretic. No erythema. No pallor.  Psychiatric: He has a normal mood and affect. His behavior is normal. Judgment and thought content normal.  Vascular: Femoral pulses +1 on the right side and +2 on the left side. Radial pulse: +2 on the right side and +1 on the left side    EKG: Sinus  Rhythm  -  Nonspecific T-abnormality.   ABNORMAL     ASSESSMENT AND PLAN

## 2014-01-31 ENCOUNTER — Other Ambulatory Visit: Payer: Medicare Other

## 2014-01-31 DIAGNOSIS — E785 Hyperlipidemia, unspecified: Secondary | ICD-10-CM

## 2014-01-31 DIAGNOSIS — E038 Other specified hypothyroidism: Secondary | ICD-10-CM

## 2014-02-01 LAB — HEPATIC FUNCTION PANEL
ALK PHOS: 94 IU/L (ref 39–117)
ALT: 7 IU/L (ref 0–44)
AST: 13 IU/L (ref 0–40)
Albumin: 4.2 g/dL (ref 3.5–4.8)
Bilirubin, Direct: 0.15 mg/dL (ref 0.00–0.40)
Total Bilirubin: 0.5 mg/dL (ref 0.0–1.2)
Total Protein: 6.5 g/dL (ref 6.0–8.5)

## 2014-02-01 LAB — LIPID PANEL
CHOLESTEROL TOTAL: 125 mg/dL (ref 100–199)
Chol/HDL Ratio: 2 ratio units (ref 0.0–5.0)
HDL: 63 mg/dL (ref >39)
LDL Calculated: 46 mg/dL (ref 0–99)
TRIGLYCERIDES: 78 mg/dL (ref 0–149)
VLDL CHOLESTEROL CAL: 16 mg/dL (ref 5–40)

## 2014-02-01 LAB — BASIC METABOLIC PANEL
BUN/Creatinine Ratio: 15 (ref 10–22)
BUN: 20 mg/dL (ref 8–27)
CHLORIDE: 102 mmol/L (ref 97–108)
CO2: 24 mmol/L (ref 18–29)
Calcium: 9.2 mg/dL (ref 8.6–10.2)
Creatinine, Ser: 1.37 mg/dL — ABNORMAL HIGH (ref 0.76–1.27)
GFR calc Af Amer: 59 mL/min/{1.73_m2} — ABNORMAL LOW (ref >59)
GFR calc non Af Amer: 51 mL/min/{1.73_m2} — ABNORMAL LOW (ref >59)
GLUCOSE: 86 mg/dL (ref 65–99)
Potassium: 5.1 mmol/L (ref 3.5–5.2)
Sodium: 141 mmol/L (ref 134–144)

## 2014-02-02 NOTE — Assessment & Plan Note (Signed)
He denies claudication at present time.

## 2014-02-02 NOTE — Assessment & Plan Note (Signed)
Continue treatment with atorvastatin. I requested fasting lipid and liver profile. 

## 2014-02-02 NOTE — Assessment & Plan Note (Signed)
He has no symptoms of angina. Continue medical therapy. 

## 2014-02-02 NOTE — Assessment & Plan Note (Signed)
Blood pressure is reasonably controlled. Recent duplex showed patent stent although there is progression of right renal artery stenosis which was not treated before. I requested basic metabolic profile to ensure stable renal function. He does not have clinical indication at the present time for revascularization.

## 2014-02-03 ENCOUNTER — Other Ambulatory Visit: Payer: Self-pay | Admitting: Cardiovascular Disease

## 2014-02-27 DIAGNOSIS — I82509 Chronic embolism and thrombosis of unspecified deep veins of unspecified lower extremity: Secondary | ICD-10-CM | POA: Diagnosis not present

## 2014-03-27 ENCOUNTER — Other Ambulatory Visit: Payer: Self-pay | Admitting: Cardiovascular Disease

## 2014-04-03 ENCOUNTER — Encounter (HOSPITAL_COMMUNITY): Payer: Self-pay | Admitting: Cardiovascular Disease

## 2014-05-01 ENCOUNTER — Telehealth: Payer: Self-pay | Admitting: Family Medicine

## 2014-05-01 NOTE — Telephone Encounter (Signed)
Please schedule CPE with fasting labs prior with Dr. [name] sometime in the next three months.

## 2014-05-02 NOTE — Telephone Encounter (Signed)
LVM for pt to call office and schedule cpe in 3 months

## 2014-05-06 ENCOUNTER — Telehealth: Payer: Self-pay | Admitting: Family Medicine

## 2014-05-06 NOTE — Telephone Encounter (Signed)
Tried to call pt again to follow up on making an appt for a CPE.  No answer and no voicemail came on this morning.

## 2014-05-06 NOTE — Telephone Encounter (Signed)
Pt called back and I made him an appt for his CPE.

## 2014-06-09 ENCOUNTER — Other Ambulatory Visit: Payer: Self-pay | Admitting: Cardiovascular Disease

## 2014-07-28 ENCOUNTER — Other Ambulatory Visit: Payer: Self-pay | Admitting: Cardiovascular Disease

## 2014-07-30 ENCOUNTER — Other Ambulatory Visit: Payer: Medicare Other

## 2014-07-30 ENCOUNTER — Other Ambulatory Visit: Payer: Self-pay | Admitting: Family Medicine

## 2014-07-30 DIAGNOSIS — E039 Hypothyroidism, unspecified: Secondary | ICD-10-CM

## 2014-07-30 DIAGNOSIS — Z125 Encounter for screening for malignant neoplasm of prostate: Secondary | ICD-10-CM

## 2014-07-30 DIAGNOSIS — Z79899 Other long term (current) drug therapy: Secondary | ICD-10-CM

## 2014-07-30 DIAGNOSIS — E785 Hyperlipidemia, unspecified: Secondary | ICD-10-CM

## 2014-07-31 ENCOUNTER — Other Ambulatory Visit: Payer: Self-pay | Admitting: Family Medicine

## 2014-08-01 ENCOUNTER — Other Ambulatory Visit (INDEPENDENT_AMBULATORY_CARE_PROVIDER_SITE_OTHER): Payer: Medicare Other

## 2014-08-01 DIAGNOSIS — E039 Hypothyroidism, unspecified: Secondary | ICD-10-CM | POA: Diagnosis not present

## 2014-08-01 DIAGNOSIS — Z125 Encounter for screening for malignant neoplasm of prostate: Secondary | ICD-10-CM | POA: Diagnosis not present

## 2014-08-01 DIAGNOSIS — Z79899 Other long term (current) drug therapy: Secondary | ICD-10-CM

## 2014-08-01 DIAGNOSIS — E785 Hyperlipidemia, unspecified: Secondary | ICD-10-CM

## 2014-08-01 LAB — BASIC METABOLIC PANEL
BUN: 22 mg/dL (ref 6–23)
CALCIUM: 9.5 mg/dL (ref 8.4–10.5)
CHLORIDE: 106 meq/L (ref 96–112)
CO2: 27 mEq/L (ref 19–32)
Creatinine, Ser: 1.31 mg/dL (ref 0.40–1.50)
GFR: 56.98 mL/min — ABNORMAL LOW (ref >60.00)
Glucose, Bld: 88 mg/dL (ref 70–99)
Potassium: 5.1 mEq/L (ref 3.5–5.1)
SODIUM: 139 meq/L (ref 135–145)

## 2014-08-01 LAB — CBC WITH DIFFERENTIAL/PLATELET
BASOS PCT: 0.7 % (ref 0.0–3.0)
Basophils Absolute: 0 10*3/uL (ref 0.0–0.1)
Eosinophils Absolute: 0.3 10*3/uL (ref 0.0–0.7)
Eosinophils Relative: 4 % (ref 0.0–5.0)
HEMATOCRIT: 47.7 % (ref 39.0–52.0)
HEMOGLOBIN: 16.2 g/dL (ref 13.0–17.0)
LYMPHS PCT: 32.8 % (ref 12.0–46.0)
Lymphs Abs: 2.1 10*3/uL (ref 0.7–4.0)
MCHC: 34 g/dL (ref 30.0–36.0)
MCV: 84.9 fl (ref 78.0–100.0)
MONO ABS: 0.6 10*3/uL (ref 0.1–1.0)
MONOS PCT: 9.3 % (ref 3.0–12.0)
Neutro Abs: 3.5 10*3/uL (ref 1.4–7.7)
Neutrophils Relative %: 53.2 % (ref 43.0–77.0)
Platelets: 212 10*3/uL (ref 150.0–400.0)
RBC: 5.62 Mil/uL (ref 4.22–5.81)
RDW: 14.1 % (ref 11.5–15.5)
WBC: 6.5 10*3/uL (ref 4.0–10.5)

## 2014-08-01 LAB — HEPATIC FUNCTION PANEL
ALK PHOS: 99 U/L (ref 39–117)
ALT: 8 U/L (ref 0–53)
AST: 12 U/L (ref 0–37)
Albumin: 4 g/dL (ref 3.5–5.2)
BILIRUBIN TOTAL: 0.7 mg/dL (ref 0.2–1.2)
Bilirubin, Direct: 0.2 mg/dL (ref 0.0–0.3)
Total Protein: 6.4 g/dL (ref 6.0–8.3)

## 2014-08-01 LAB — LIPID PANEL
CHOLESTEROL: 107 mg/dL (ref 0–200)
HDL: 48.2 mg/dL (ref >39.00)
LDL CALC: 45 mg/dL (ref 0–99)
NonHDL: 58.8
Total CHOL/HDL Ratio: 2
Triglycerides: 70 mg/dL (ref 0.0–149.0)
VLDL: 14 mg/dL (ref 0.0–40.0)

## 2014-08-01 LAB — PSA: PSA: 3.32 ng/mL (ref 0.10–4.00)

## 2014-08-01 LAB — TSH: TSH: 0.03 u[IU]/mL — ABNORMAL LOW (ref 0.35–4.50)

## 2014-08-06 ENCOUNTER — Encounter: Payer: Self-pay | Admitting: Family Medicine

## 2014-08-06 ENCOUNTER — Ambulatory Visit (INDEPENDENT_AMBULATORY_CARE_PROVIDER_SITE_OTHER): Payer: Medicare Other | Admitting: Family Medicine

## 2014-08-06 VITALS — BP 140/78 | HR 81 | Temp 97.6°F | Ht 68.5 in | Wt 174.2 lb

## 2014-08-06 DIAGNOSIS — I255 Ischemic cardiomyopathy: Secondary | ICD-10-CM | POA: Diagnosis not present

## 2014-08-06 DIAGNOSIS — I10 Essential (primary) hypertension: Secondary | ICD-10-CM

## 2014-08-06 DIAGNOSIS — Z0000 Encounter for general adult medical examination without abnormal findings: Secondary | ICD-10-CM

## 2014-08-06 DIAGNOSIS — J449 Chronic obstructive pulmonary disease, unspecified: Secondary | ICD-10-CM

## 2014-08-06 DIAGNOSIS — N183 Chronic kidney disease, stage 3 unspecified: Secondary | ICD-10-CM

## 2014-08-06 DIAGNOSIS — I739 Peripheral vascular disease, unspecified: Secondary | ICD-10-CM

## 2014-08-06 DIAGNOSIS — I714 Abdominal aortic aneurysm, without rupture, unspecified: Secondary | ICD-10-CM

## 2014-08-06 DIAGNOSIS — E038 Other specified hypothyroidism: Secondary | ICD-10-CM | POA: Diagnosis not present

## 2014-08-06 DIAGNOSIS — I700 Atherosclerosis of aorta: Secondary | ICD-10-CM

## 2014-08-06 DIAGNOSIS — I2510 Atherosclerotic heart disease of native coronary artery without angina pectoris: Secondary | ICD-10-CM

## 2014-08-06 DIAGNOSIS — I701 Atherosclerosis of renal artery: Secondary | ICD-10-CM

## 2014-08-06 NOTE — Progress Notes (Signed)
Pre visit review using our clinic review tool, if applicable. No additional management support is needed unless otherwise documented below in the visit note. 

## 2014-08-06 NOTE — Progress Notes (Signed)
Dr. Frederico Hamman T. Ella Guillotte, MD, [facility] [place], [address] Phone: [phone] Fax: [phone]  08/06/2014  Patient: [name], MRN: [id number], DOB: May 22, 1940, 74 y.o.  Primary Physician:  Owens Loffler, MD  Chief Complaint: Annual Exam  Subjective:   [name] is a 74 y.o. pleasant patient who presents for a medicare wellness examination:  Preventative Health Maintenance Visit:  Health Maintenance Summary Reviewed and updated, unless pt declines services.  Tobacco History Reviewed. Alcohol: No concerns, no excessive use Exercise Habits: Some activity, rec at least 30 mins 5 times a week - now 3x a week to cardiopulm rehab STD concerns: no risk or activity to increase risk Drug Use: None Encouraged self-testicular check   Vaccines up to date.  Never had a flu shot.   Doing cardiopulmonary rehab at [facility].   TSH is low. "I have been on the same dose of synthroid for 20 years.)  Wt Readings from Last 3 Encounters:  08/06/14 174 lb 4 oz (79.039 kg)  01/30/14 171 lb (77.565 kg)  08/01/13 170 lb 4 oz (77.225 kg)    At this point, he is not really interested in taking any inhalers for COPD.  Health Maintenance  Topic Date Due  . ZOSTAVAX  06/02/2001  . INFLUENZA VACCINE  11/24/2014  . TETANUS/TDAP  09/06/2016  . COLONOSCOPY  01/12/2021  . PNA vac Low Risk Adult  Completed    Immunization History  Administered Date(s) Administered  . Pneumococcal Conjugate-13 02/20/2014  . Pneumococcal Polysaccharide-23 04/11/2008  . Td 09/07/2006    Patient Active Problem List   Diagnosis Date Noted  . CKD (chronic kidney disease) stage 3, GFR 30-59 ml/min 05/01/2013    Priority: High  . Intraventricular hemorrhage 07/12/2012    Priority: High  . Ischemic cardiomyopathy 07/08/2011    Priority: High  . CAD (coronary artery disease), native coronary artery 07/07/2011    Priority: High  . COPD  (chronic obstructive pulmonary disease) 01/08/2010    Priority: High  . Bilateral renal artery stenosis     Priority: Medium  . HTN (hypertension) 07/15/2012    Priority: Medium  . Peripheral arterial disease     Priority: Medium  . Hypothyroidism 09/07/2006    Priority: Medium  . RAS (renal artery stenosis) 07/10/2013  . Acute respiratory failure 05/24/2013  . Paroxysmal a-fib: briefly in ED now in NSR 05/24/2013  . SIRS (systemic inflammatory response syndrome) 05/24/2013  . Aortic calcification 05/01/2013  . Right leg DVT 08/13/2012  . Subclavian arterial stenosis 04/25/2011  . BPH (benign prostatic hyperplasia) 12/29/2010  . Abdominal aortic aneurysm 11/06/2009  . HYPERCHOLESTEROLEMIA 10/09/2009  . TOBACCO ABUSE 04/11/2008  . PROSTATE SPECIFIC ANTIGEN, ELEVATED 04/11/2008   Past Medical History  Diagnosis Date  . Venous insufficiency   . Peripheral arterial disease     a. Previous left lower extremity stenting by Dr. Jamal Collin;  b. 12/2012 s/p bilat ostial common iliac stenting.  . Tobacco abuse   . COPD (chronic obstructive pulmonary disease)   . Hypothyroidism   . Subclavian artery stenosis, left 05/2012    Status post stenting of the ostium and self-expanding stent placement to the left axillary artery  . Right leg DVT 08/13/2012  . Intraventricular hemorrhage 07/12/2012  . Hypertension   . CHF (congestive heart failure)   . Arthritis     "hands; not dx'd" (12/26/2012)  . Bilateral renal artery stenosis     a. 06/2013  Angio/PTA: LRA: 95 (5x18 Herculink stent), RRA 60ost, celiac/SMA nl, RCIA patent stents extending into dist Ao, RIIA 70ost, REIA min irregs, LCIA patent stent, LIIA 60-70ost, LEIA min irregs.  . Aortic calcification 05/01/2013  . Pneumonia   . Coronary artery disease   . Shortness of breath    Past Surgical History  Procedure Laterality Date  . Angioplasty / stenting femoral  05/2012  . Coronary artery bypass graft  07/11/2011    Procedure: CORONARY ARTERY  BYPASS GRAFTING (CABG);  Surgeon: Grace Isaac, MD;  Location: [facility];  Service: Open Heart Surgery;  Laterality: N/A;  Times 3. On Pump. Using right greater saphenous vein and left internal mammary artery.   . Cardiac catheterization  2/14    [facility]  . Subclavian artery stent  05/2012    "2 stents" (12/26/2012)  . Vena cava filter placement  07/2012    Removable  . Angioplasty / stenting iliac Bilateral 12/26/2012  . Cholecystectomy  1990's  . Hernia repair    . Renal artery angioplasty Left 07/10/2013    DR ARIDA  . Abdominal angiogram  07/10/2013    WITH BI-FEMORAL RUNOFF       DR ARIDA  . Left and right heart catheterization with coronary angiogram  07/07/2011    Procedure: LEFT AND RIGHT HEART CATHETERIZATION WITH CORONARY ANGIOGRAM;  Surgeon: Minna Merritts, MD;  Location: [facility] CATH LAB;  Service: Cardiovascular;;  . Arch aortogram N/A 06/20/2012    Procedure: ARCH AORTOGRAM;  Surgeon: Wellington Hampshire, MD;  Location: [facility] CATH LAB;  Service: Cardiovascular;  Laterality: N/A;  . Percutaneous stent intervention  06/20/2012    Procedure: PERCUTANEOUS STENT INTERVENTION;  Surgeon: Wellington Hampshire, MD;  Location: [facility] CATH LAB;  Service: Cardiovascular;;  . Abdominal aortagram N/A 12/26/2012    Procedure: ABDOMINAL Maxcine Ham;  Surgeon: Wellington Hampshire, MD;  Location: [facility] C[facility] LAB;  Service: Cardiovascular;  Laterality: N/A;  . Insertion of iliac stent Bilateral 12/26/2012    Procedure: INSERTION OF ILIAC STENT;  Surgeon: Wellington Hampshire, MD;  Location: [facility] CATH LAB;  Service: Cardiovascular;  Laterality: Bilateral;  Bilateral Common Iliac Artery  . Abdominal aortagram N/A 07/10/2013    Procedure: ABDOMINAL Maxcine Ham;  Surgeon: Wellington Hampshire, MD;  Location: [facility] CATH LAB;  Service: Cardiovascular;  Laterality: N/A;   History   Social History  . Marital Status: Married    Spouse Name: N/A  . Number of Children: 2  . Years of Education: N/A   Occupational History  . Retired     Land     Social History Main Topics  . Smoking status: Former Smoker -- 1.00 packs/day for 50 years    Types: Cigarettes    Quit date: 04/27/2011  . Smokeless tobacco: Never Used  . Alcohol Use: No  . Drug Use: No  . Sexual Activity: Not Currently   Other Topics Concern  . Not on file   Social History Narrative   Exercsiing 3 times a week.    Moderate diet control.    O living will, no HCPOA.   Family History  Problem Relation Age of Onset  . Alcohol abuse Father   . Cirrhosis Father   . Hypothyroidism Sister    Allergies  Allergen Reactions  . Plavix [Clopidogrel Bisulfate]     Brain hemorrhage prev while on plavix and aspirin    Medication list has been reviewed and updated.   General: Denies fever, chills, sweats. No significant weight loss. Eyes:  Denies blurring,significant itching ENT: Denies earache, sore throat, and hoarseness. Cardiovascular: Denies chest pains, palpitations, dyspnea on exertion Respiratory: Denies cough, SOME SOB WITH USING THE ELLIPTICAL MACHINE. 20 MINS ON TREADMILL NO PROBLEM Breast: no concerns about lumps GI: Denies nausea, vomiting, diarrhea, constipation, change in bowel habits, abdominal pain, melena, hematochezia GU: Denies penile discharge, ED, urinary flow / outflow problems. No STD concerns. Musculoskeletal: Denies back pain, joint pain Derm: Denies rash, itching Neuro: Denies  paresthesias, frequent falls, frequent headaches Psych: Denies depression, anxiety Endocrine: Denies cold intolerance, heat intolerance, polydipsia Heme: Denies enlarged lymph nodes Allergy: No hayfever  Objective:   BP 140/78 mmHg  Pulse 81  Temp(Src) 97.6 F (36.4 C) (Oral)  Ht 5' 8.5" (1.74 m)  Wt 174 lb 4 oz (79.039 kg)  BMI 26.11 kg/m2  The patient completed a fall screen and PHQ-2 and PHQ-9 if necessary, which is documented in the EHR. The CMA/LPN/RN who assisted the patient verbally completed with them and documented results in [place].   Hearing Screening   Method: Audiometry   125Hz 250Hz 500Hz 1000Hz 2000Hz 4000Hz 8000Hz  Right ear:   _0 Left ear:   40 40 20      Visual Acuity Screening   Right eye Left eye Both eyes  Without correction:     With correction: 20/30 20/20 20/20    GEN: well developed, well nourished, no acute distress Eyes: conjunctiva and lids normal, PERRLA, EOMI ENT: TM clear, nares clear, oral exam WNL Neck: supple, no lymphadenopathy, no thyromegaly, no JVD Pulm: clear to auscultation and percussion, respiratory effort normal CV: regular rate and rhythm, S1-S2, no murmur, rub or gallop, no bruits, peripheral pulses normal and symmetric, no cyanosis, clubbing, edema or varicosities GI: soft, non-tender; no hepatosplenomegaly, masses; active bowel sounds all quadrants GU: no hernia, testicular mass, penile discharge Lymph: no cervical, axillary or inguinal adenopathy MSK: gait normal, muscle tone and strength WNL, no joint swelling, effusions, discoloration, crepitus  SKIN: clear, good turgor, color WNL, no rashes, lesions, or ulcerations Neuro: normal mental status, normal strength, sensation, and motion Psych: alert; oriented to person, place and time, normally interactive and not anxious or depressed in appearance.  All labs reviewed with patient.  Lipids:    Component Value Date/Time   CHOL 107 08/01/2014 0916   CHOL 125 01/31/2014 0756   TRIG 70.0 08/01/2014 0916   HDL 48.20 08/01/2014 0916   HDL 63 01/31/2014 0756   VLDL 14.0 08/01/2014 0916   CHOLHDL 2 08/01/2014 0916   CHOLHDL 2.0 01/31/2014 0756   CBC: CBC Latest Ref Rng 08/01/2014 06/27/2013 05/25/2013  WBC 4.0 - 10.5 K/uL 6.5 6.5 7.4  Hemoglobin 13.0 - 17.0 g/dL 16.2 14.4 13.2  Hematocrit 39.0 - 52.0 % 47.7 43.6 39.1  Platelets 150.0 - 400.0 K/uL 212.0 204 242    Basic Metabolic Panel:    Component Value Date/Time   NA 139 08/01/2014 0916   NA 141 01/31/2014 0756   K 5.1 08/01/2014 0916   CL 106  08/01/2014 0916   CO2 27 08/01/2014 0916   BUN 22 08/01/2014 0916   BUN 20 01/31/2014 0756   CREATININE 1.31 08/01/2014 0916   CREATININE 1.02 04/22/2011 1507   GLUCOSE 88 08/01/2014 0916   GLUCOSE 86 01/31/2014 0756   CALCIUM 9.5 08/01/2014 0916   Hepatic Function Latest Ref Rng 08/01/2014 01/31/2014 05/25/2013  Total Protein 6.0 - 8.3 g/dL 6.4 6.5 6.3  Albumin 3.5 -  5.2 g/dL 4.0 - 2.7(L)  AST 0 - 37 U/L 12 13 32  ALT 0 - 53 U/L _0 Alk Phosphatase 39 - 117 U/L 99 94 64  Total Bilirubin 0.2 - 1.2 mg/dL 0.7 0.5 0.5  Bilirubin, Direct 0.0 - 0.3 mg/dL 0.2 0.15 -    Lab Results  Component Value Date   TSH 0.03* 08/01/2014   Lab Results  Component Value Date   PSA 3.32 08/01/2014   PSA 2.58 11/21/2011   PSA 2.79 09/29/2009    Assessment and Plan:   Routine general medical examination at a health care facility  Other specified hypothyroidism - Plan: T3, free, T4, free, TSH  Recheck thyroid in 2 mo  Overall, doing pretty well given last 3 years of medical history.  Health Maintenance Exam: The patient's preventative maintenance and recommended screening tests for an annual wellness exam were reviewed in full today. Brought up to date unless services declined.  Counselled on the importance of diet, exercise, and its role in overall health and mortality. The patient's FH and SH was reviewed, including their home life, tobacco status, and drug and alcohol status.  I have personally reviewed the Medicare Annual Wellness questionnaire and have noted 1. The patient's medical and social history 2. Their use of alcohol, tobacco or illicit drugs 3. Their current medications and supplements 4. The patient's functional ability including ADL's, fall risks, home safety risks and hearing or visual             impairment. 5. Diet and physical activities 6. Evidence for depression or mood disorders  The patients weight, height, BMI and visual acuity have been recorded in the  chart I have made referrals, counseling and provided education to the patient based review of the above and I have provided the pt with a written personalized care plan for preventive services.  I have provided the patient with a copy of your personalized plan for preventive services. Instructed to take the time to review along with their updated medication list.  Follow-up: No Follow-up on file. Or follow-up in 1 year for complete physical examination  New Prescriptions   No medications on file   Orders Placed This Encounter  Procedures  . T3, free  . T4, free  . TSH    Signed,  Frederico Hamman T. Arth Nicastro, MD   Patient's Medications  New Prescriptions   No medications on file  Previous Medications   AMLODIPINE (NORVASC) 5 MG TABLET    TAKE ONE TABLET BY MOUTH EVERY DAY   ASPIRIN EC 81 MG TABLET    Start 1 tablet daily a week from 08/01/13   ATORVASTATIN (LIPITOR) 20 MG TABLET    TAKE 1 TABLET BY MOUTH DAILY AT 6PM   CARVEDILOL (COREG) 3.125 MG TABLET    TAKE 1 TABLET TWICE DAILY WITH MEALS   LEVOTHYROXINE (SYNTHROID, LEVOTHROID) 200 MCG TABLET    TAKE ONE TABLET BY MOUTH EVERY DAY  Modified Medications   No medications on file  Discontinued Medications   ACETAMINOPHEN (TYLENOL) 500 MG TABLET    Take 1,000 mg by mouth every 8 (eight) hours as needed for mild pain.

## 2014-08-06 NOTE — Patient Instructions (Addendum)
Check with your insurance to see if they will cover the shingles shot.  F/u 2 months to recheck thyroid labs

## 2014-08-15 NOTE — Discharge Summary (Signed)
PATIENT NAME:  [name], [name] MR#:  [id number] DATE OF BIRTH:  05/14/1940  DATE OF ADMISSION:  07/27/2012 DATE OF DISCHARGE:  07/28/2012  PRIMARY CARE PHYSICIAN:  In [place].   CONSULTATIONS IN THE HOSPITAL:  Vascular consultation by Dr. Delana Meyer.   DISCHARGE DIAGNOSES: 1.  Right lower extremity extensive occluded deep vein thrombosis from common femoral vein up to the popliteal vein status post IVC filter placement.  2.  Recent intracranial hemorrhage 2 weeks ago while he was on aspirin and Plavix in March 2014.   3.  Subclavian stent placed in February 2014.  4.  Severe peripheral vascular disease, status post subclavian stent and prior left leg stent.  5.  Coronary artery disease status post bypass graft surgery.  6.  Hypertension.   DISCHARGE HOME MEDICATIONS:  1.  Albuterol 2.5 mg inhaled q. 4 hours as needed for wheezing or shortness of breath.  2.  Amlodipine 10 mg by mouth daily.  3.  Atorvastatin 20 mg by mouth daily.  4.  Coreg 3.125 mg by mouth twice daily.  5.  Depakote 500 mg by mouth daily for seizure prophylaxis from his recent intracranial hemorrhage.  6.  Levothyroxine 200 mcg by mouth daily.  7.  Lisinopril 5 mg by mouth daily.  8.  Norco 5/325 mg 1 tablet q. 4 hours as needed for pain.  9.  Aspirin 81 mg by mouth daily.   DISCHARGE DIET:  Low-sodium diet.   DISCHARGE ACTIVITY:  As tolerated.   FOLLOW-UP INSTRUCTIONS: 1.  Please follow up with Dr. Delana Meyer in 10 days.  2.  PCP follow-up in two weeks.  3.  Keep the right leg elevated.   LABORATORY AND IMAGING STUDIES:  WBC 10.6, hemoglobin 14.3, hematocrit 43.2, platelet count 150.   Sodium 139, potassium 4.9, chloride 107, bicarb 27, BUN 27, creatinine 1.2, glucose 83 and calcium of 8.4, INR is 1.3 and ultrasound Doppler of right lower extremity showing occlusive thrombus through all the visualized deep venous system of the right leg from common femoral vein into the popliteal vein.   BRIEF HOSPITAL  COURSE:  The patient is a 74 year old pleasant Caucasian male with multiple past medical problems including coronary artery disease status post bypass surgery, peripheral vascular disease with recent subclavian stent placement 2 months ago and was placed on aspirin and Plavix which resulted in a spontaneous intracranial hemorrhage 2 weeks ago for which both aspirin and Plavix were stopped and he was treated at [facility] conservatively.  So now he comes with right lower extremity swelling and Doppler showed DVT.  1.  Right lower extremity DVT in light of a recent intracranial hemorrhage so contraindicated for any kind of anticoagulation at this time.  After speaking with neurosurgeon at [facility] where he had his intracranial hemorrhage monitored, they said baby aspirin should be fine.  So patient had a vascular consult, had an IVC filter placed and is being discharged on 81 mg of aspirin.  He will follow up with Dr. Delana Meyer in the office for any post DVT complications and further management.    2.  Intracranial hemorrhage, appears stable.  He has a follow-up with his neurosurgeon in the next couple of weeks.  He is on Depakote for seizure prophylaxis which we will continue at this time.  3.  Hypertension.  He is on Norvasc, Coreg and lisinopril, which we will continue.  4.  CAD status post bypass graft surgery.  All his medications were continued and he is stable  from that standpoint.  5.  Peripheral vascular disease with prior left leg stent, recent carotid stent, currently only on aspirin due to intracranial hemorrhage.  To monitor as an outpatient.  He ambulated well after his procedure.  His groin looked good without any bleeding from the site they accessed yesterday for placing the IVC filter, so he is being discharged home in a stable condition.   DISCHARGE DISPOSITION:  Home.   DISCHARGE CONDITION:  Stable.   TIME SPENT ON DISCHARGE:  Is 45 minutes.     ____________________________ Gladstone Lighter,  MD rk:ea D: 07/28/2012 11:56:46 ET T: 07/28/2012 23:11:42 ET JOB#: [id number]  cc: Gladstone Lighter, MD, <Dictator> Owens Loffler, MD Gladstone Lighter MD ELECTRONICALLY SIGNED 08/10/2012 15:44

## 2014-08-15 NOTE — H&P (Signed)
PATIENT NAME:  [name], [name] MR#:  [id number] DATE OF BIRTH:  16-Apr-1941  DATE OF ADMISSION:  07/27/2012  PRIMARY CARE PHYSICIAN: Owens Loffler, MD, at [facility] [place].  REFERRING EMERGENCY ROOM PHYSICIAN: Orlie Dakin, MD  CHIEF COMPLAINT: Right leg pain and swelling.   HISTORY OF PRESENT ILLNESS: The patient is a 74 year old male with a past medical history of hypothyroidism, peripheral arterial disease, previous left lower extremity stenting, tobacco abuse in the past, COPD, arthritis, hypothyroidism, subclavian artery stenosis and stent placement in February 2014, intracranial hemorrhage in March 2014. After subclavian artery stenting, which was 6 to 7 weeks ago, he was discharged with aspirin and Plavix. He was taking it and then started having sudden severe headache in March. On March 20, he went to his PMD for that and PMD immediately sent him to imaging center. They found he has intracranial hemorrhage and transferred to [facility]. Neurologist saw him at [facility] and advised to stop taking aspirin and Plavix. After discharge, he was having no leftover weakness or problems. For the last 3 days, he started noticing his right leg is swollen and it is painful and so came over here. On workup in ER, he was found having extensive DVT in right lower extremity. The ER physician spoke to the neurologist at [facility], who was his attending physician, and he okays to use aspirin 81 mg but suggested to put IVC filter and no other anticoagulants. The ER physician spoke to Dr. Delana Meyer for vascular surgery to put IVC filter for the patient and gave him for admission.   REVIEW OF SYSTEMS:  CONSTITUTIONAL: Negative for fever, fatigue, weakness. Pain in the right lower extremity. EYES: No blurring, double vision or discharge.  EARS, NOSE, THROAT: No tinnitus, ear pain or hearing loss.  RESPIRATORY: No cough, wheezing or hemoptysis.  CARDIOVASCULAR: No chest pain, orthopnea, edema or  arrhythmia.  GASTROINTESTINAL: No nausea, vomiting, diarrhea or abdominal pain.  GENITOURINARY: No dysuria, hematuria or increased frequency.  ENDOCRINE: No heat or cold intolerance.  SKIN: No acne or rashes  MUSCULOSKELETAL: He has pain and swelling of the right lower extremity, otherwise no other symptoms.  NEUROLOGICAL: No numbness, weakness, dysarthria, tremors.  PSYCHIATRIC: No anxiety, insomnia or depression.   PAST MEDICAL HISTORY: Thyroid disease, hypothyroidism, venous insufficiency, peripheral arterial disease, previous left lower extremity stenting by Dr. Jamal Collin, tobacco abuse, chronic obstructive pulmonary disease, arthritis, subclavian artery stenosis and stent on the left side in February 2014, intracranial hemorrhage in March 2014.   PAST SURGICAL HISTORY: Cholecystectomy, angioplasty and stenting on the femoral and coronary artery bypass grafting in March 2013.   SOCIAL HISTORY: Denies smoking, drinking alcohol or illicit drug use. He is retired from a Psychologist, prison and probation services.   FAMILY HISTORY: Noncontributory. He says no for diabetes or stroke in any family members.  MEDICATIONS AT HOME: Albuterol inhaled every 4 hours as needed for wheezing or shortness of breath, amlodipine 10 mg oral tablet once a day, atorvastatin 20 mg once a day, carvedilol 3.125 mg 2 times a day, divalproex 500 mg oral tablet once a day, levothyroxine 200 mcg once a day, lisinopril 5 mg once a day.  PHYSICAL EXAMINATION:  VITAL SIGNS: Temperature 97.7, pulse rate 82, respirations 20, blood pressure 107/75. GENERAL: He is fully alert, oriented to time, place and person and cooperative with history taking and physical examination.  HEENT: Head and neck atraumatic. Conjunctivae pink. Oral mucosa moist.  NECK: Supple. No JVD.  RESPIRATORY: Bilateral clear and  equal air entry.  CARDIOVASCULAR: S1, S2 present, regular. Scar on the sternum present.  ABDOMEN: Soft, nontender. Bowel sounds present. No  organomegaly.  SKIN: No rashes.  EXTREMITIES: Right leg swollen compared to the left and tenderness on calf. There is a scar of surgery for venous graft for CABG from right leg.  NEUROLOGICAL: Grossly normal. Power 5/5 all 4 limbs. No tremors. No headache.   LABORATORY AND RADIOLOGIC DATA: Doppler of lower extremity: Occlusive thrombus throughout the visualized deep venous system of the right leg from common femoral vein into popliteal vein. Glucose 82, BUN 13, creatinine 1.39, sodium 136, potassium 4.7, chloride 103, CO2 of 27. Hemoglobin 15.1.   ASSESSMENT AND PLAN: This is a 74 year old male with past medical history of coronary artery bypass graft, peripheral arterial disease, recent subclavian artery stent and intracranial hemorrhage, 15 days ago stopped taking aspirin and Plavix and now presented with right lower extremity pain and swelling.  1.  Deep vein thrombosis, right lower extremity: Emergency Room physician spoke to neurologist at [facility]. He agrees for inferior vena cava filter and starting him on aspirin. No further anticoagulation due to recent intracranial hemorrhage.  2.  Subclavian artery stent: Will continue on aspirin for now. No Plavix due to intracranial hemorrhage.  3.  Coronary artery bypass grafting, coronary artery disease: Will give aspirin, statin and beta blocker.  4.  Recurrent headache due to intracranial hemorrhage: Was discharged on divalproex sodium by [facility]. Will continue over here.  5.  Hypothyroidism: Resume levothyroxine.  6.  Chronic obstructive pulmonary disease: Albuterol inhaler as needed. Currently no wheezing.   CODE STATUS: Full code.   TOTAL TIME SPENT: 50 minutes.   ____________________________ Ceasar Lund Anselm Jungling, MD [id number] D: 07/27/2012 17:46:43 ET T: 07/27/2012 19:21:35 ET JOB#: [id number]  cc: Ceasar Lund. Anselm Jungling, MD, <Dictator> Vaughan Basta MD ELECTRONICALLY SIGNED 08/05/2012 22:13

## 2014-08-15 NOTE — Op Note (Signed)
PATIENT NAME:  [name], STALLBAUMER MR#:  [id number] DATE OF BIRTH:  January 13, 1941  DATE OF PROCEDURE:  07/27/2012  PREOPERATIVE DIAGNOSES: 1.  Deep vein thrombosis lower extremity.  2.  Recent intracranial hemorrhage.  3.  Recent left groin hematoma complicating cardiac catheterization.   POSTOPERATIVE DIAGNOSES:  1.  Deep vein thrombosis lower extremity.  2.  Recent intracranial hemorrhage.  3.  Recent left groin hematoma complicating cardiac catheterization.  PROCEDURES PERFORMED: 1.  Inferior venacavogram.  2.  Placement of infrarenal inferior vena caval filter, Meridian type.   SURGEON:  Katha Cabal, M.D.   SEDATION:  Versed 3 mg plus fentanyl 100 mcg administered IV.  Continuous ECG, pulse oximetry and cardiopulmonary monitoring is performed throughout the entire procedure by the interventional radiology nurse.  Total sedation time is 30 minutes.   ACCESS:  9.5 French sheath, right common femoral vein.   FLUOROSCOPY TIME:  Approximately 1 minute.   CONTRAST USED:  Isovue 20 mL.   INDICATIONS:  The patient is a 74 year old gentleman who presented to the Emergency Room with increasing pain and swelling of his lower extremity.  Duplex ultrasound demonstrated acute DVT.  He is not a candidate for anticoagulation based on recent intracranial hemorrhage as well as a recent hematoma formation following cardiac catheterization and he therefore is undergoing evaluation for possible filter placement.  Risks and benefits were reviewed.  All questions answered.  The patient agrees to proceed.   DESCRIPTION OF PROCEDURE:  The patient is taken to special procedures, placed in the supine position.  After adequate sedation is achieved, both groins are prepped and draped in a sterile fashion.  Ultrasound is placed in a sterile sleeve.  Ultrasound is utilized secondary to lack of appropriate landmarks and to avoid vascular injury.  Under direct ultrasound visualization, common femoral artery is  identified.  It is echolucent and compressible indicating patency.  Image is recorded for the permanent record.  Under real-time visualization after 1% lidocaine has been infiltrated in the soft tissues, access to the common femoral vein is obtained with a Seldinger needle.  A J-wire is advanced without difficulty.  Dilator followed by delivery sheath is then advanced.  Delivery sheath is positioned at the confluence of the iliac veins and a bolus injection of contrast used to demonstrate the inferior vena cava.  Inferior vena cava is approximately 60 mm in diameter and the renal blushes are noted at the L1 level.   The wire is reintroduced.  The sheath is advanced to the L1 level.  The filter is then positioned within the sheath.  The sheath is pulled back to L2, and the filter deployed in the infrarenal position without difficulty.  The sheath is pulled, pressure is held, and there are no immediate complications.   INTERPRETATION:  Inferior vena cava is imaged with a bolus injection of contrast.  It is patent.  No evidence of filling defects or other abnormalities.   SUMMARY:  A successful placement of infrarenal inferior vena caval filter as described above.     ____________________________ Katha Cabal, MD ggs:ea D: 07/28/2012 12:35:13 ET T: 07/29/2012 00:12:46 ET JOB#: [id number]  cc: Katha Cabal, MD, <Dictator> Katha Cabal, MD Jinny Sanders, MD Katha Cabal MD ELECTRONICALLY SIGNED 09/03/2012 13:41

## 2014-08-16 NOTE — Discharge Summary (Signed)
PATIENT NAME:  [name], [name] MR#:  [id number] DATE OF BIRTH:  Nov 11, 1940  DATE OF ADMISSION:  06/02/2013 DATE OF DISCHARGE:  06/03/2013  ADMISSION DIAGNOSES: 1.  Pneumonia.   2.  Chest pain.  DISCHARGE DIAGNOSES: 1.  Pneumonia.  2.  Chest pain.  3.  History of coronary artery disease.  4.  History of chronic obstructive pulmonary disease.  5.  History of bilateral renal artery stenosis   CONSULTATIONS: Dr. Fletcher Anon from cardiology.   LABORATORIES AT DISCHARGE: White blood cells 6.1, hemoglobin 13, hematocrit 38. Platelets are 323. Sodium 136, potassium 4.1, chloride 106, bicarb 27, BUN 22, creatinine 1.5. Glucose is 80. Troponins x 3 were negative.   HOSPITAL COURSE: A 74 year old male with history of coronary artery disease who was recently hospitalized for pneumonia, who presented with chest pain. For further details, please refer to the H and P.   1.  Pneumonia. The patient was treated for 5 days; apparently on a few days of Rocephin and azithromycin. He continues to have a left lower lobe pneumonia. The patient was not discharged with any antibiotics, so he was placed on Levaquin here.  He will be discharged for a total of 6 more days of therapy for his pneumonia.   From a pneumonia standpoint, he still has some very minimal crackles at the left base, but he is not requiring any oxygen and has no issues.  2.  Chest pain. This is likely secondary to his pneumonia or related to a musculoskeletal issue, since it is not cardiac in nature. His chest pain actually has improved. He can take some p.r.n. ibuprofen, but he needs to monitor it closely, as he does have renal artery stenosis. His troponins were all negative.  Cardiology was consulted. They did not feel that this was cardiac in nature.  3.  History of coronary artery disease. The patient is continued on outpatient medications.  4.  History of chronic obstructive pulmonary disease, which was stable.  5.  History of bilateral renal artery  stenosis. The plan is for the patient to have bilateral renal artery stenting. This is to be performed in approximately 2 weeks by Dr. Fletcher Anon. The patient  will follow up with Dr. Fletcher Anon.  6.  Chronic diastolic heart failure, which is stable.   DISCHARGE MEDICATIONS: 1.  Coreg 3.125 b.i.d.  2.  Synthroid 200 mcg daily.  3.  Aspirin 81 mg daily.  4.  Atorvastatin 20 mg daily.  5.  Lasix 20 mg daily as needed for swelling.  6.  Levaquin 750 mg q.24 hours x 6 days.   DISCHARGE DIET: Low sodium.   DISCHARGE ACTIVITY: As tolerated.   DISCHARGE FOLLOWUP: The patient will follow up with Dr. Fletcher Anon in 2 weeks.  TIME SPENT: Approximately 35 minutes.   The patient was medically stable for discharge.   ____________________________ Nyair Depaulo P. Benjie Karvonen, MD spm:dmm D: 06/03/2013 14:24:11 ET T: 06/03/2013 20:19:57 ET JOB#: [id number]  cc: Fritzie Prioleau P. Benjie Karvonen, MD, <Dictator> Muhammad A. Fletcher Anon, MD Donell Beers Ruger Saxer MD ELECTRONICALLY SIGNED 06/04/2013 15:23

## 2014-08-16 NOTE — H&P (Signed)
PATIENT NAME:  [name], [name] MR#:  [id number] DATE OF BIRTH:  December 07, 1940  DATE OF ADMISSION:  06/02/2013  PRIMARY CARE PHYSICIAN: At [facility], Dr. [name]  CHIEF COMPLAINT: Chest pain.   HISTORY OF PRESENT ILLNESS: A 74 year old male who was recently discharged from [facility] with a pneumonia and respiratory failure who presents with the above complaint. Since 4:00 this morning, the patient woke up with sharp 8/10 chest pain located on the left side without any radiation. No other symptom such as shortness of breath or diaphoresis, nausea, vomiting associated with. It has been unrelenting since that time. He has not received any nitro for this, but he has received aspirin. He says this is different than his cardiac pain that he has had in the past. He was treated for pneumonia just recently,  was not sent home on any antibiotics, but since his discharge he has been doing well up until this morning. He denies any fever or chills.   REVIEW OF SYSTEMS:  CONSTITUTIONAL: No fever, fatigue, weakness, weight loss or gain.  EYES: No blurry vision, glaucoma, cataracts.  ENT: No ear pain, hearing loss, seasonal allergies,RESPIRATORY: No cough, wheezing, dyspnea. Positive chest pain when he moves.  CARDIOVASCULAR: Positive chest pain when he moves in any direction. No palpitations, orthopnea, edema, arrhythmia, dyspnea on exertion. GASTROINTESTINAL: No nausea, vomiting, diarrhea, abdominal pain, melena or ulcers. GENITOURINARY: No dysuria or hematuria.  ENDOCRINE: No thyroid problems.  HEMATOLOGIC/LYMPHATIC:  No easy bruising, anemia.  SKIN: No rash or lesions.  MUSCULOSKELETAL: No limited activity. He works out 3 times a week at [employer].  NEUROLOGIC: No history of CVA, or TIA.  PSYCHIATRIC: No history of anxiety or depression.   PAST MEDICAL HISTORY: 1. Hypothyroidism.  2. Peripheral arterial disease with left lower extremity stenting.  3. History of COPD. 4. History of  subclavian artery stenosis.  5. History of CAD with a stent in February 2014.  6. Intracranial hemorrhage in March 2014 as a result of Plavix given for his stent.   SOCIAL HISTORY: No tobacco, alcohol or drug use.   FAMILY HISTORY: No history of diabetes or strokes or CAD  in the family.   PAST SURGICAL HISTORY: 1. Cholecystectomy.  2. Angioplasty and stenting of the femoral coronary artery bypass grafting March 2013.   MEDICATIONS:  1. Atorvastatin 20 mg at bedtime.  2. Aspirin 81 mg daily  3. Coreg 3.125 b.i.d.  4. Synthroid 75 mcg daily.  5. Lasix 20 mg daily p.r.n.   PHYSICAL EXAMINATION: VITAL SIGNS: Temperature 97.5, pulse 82, respirations 18, blood pressure 122/62, 90% on room air.  GENERAL: The patient is alert in mild distress from his pain on the left side.  HEENT: Head is atraumatic. Pupils are round. Sclerae anicteric. (oropharynx moist, oropharynx clear. NECK: Supple without JVD, carotid bruit or enlarged thyroid.  CARDIOVASCULAR: Regular rate and rhythm. No murmurs, gallops, or rubs. PMI is not displaced. He does have pain on palpation of the chest wall.  LUNGS: He has crackles at the left lung base without  any wheezing, rhonchi, or rales. Normal chest expansion.  BACK: No CVA or vertebral tenderness.  ABDOMEN: Bowel sounds are positive. Nontender, nondistended. No hepatosplenomegaly.  EXTREMITIES: No clubbing sinuses. He has very minimal edema on the right leg which is at his baseline.  NEUROLOGICAL: Cranial nerves II through XII are grossly intact. There are no focal deficits.  SKIN: Without rash or lesions.   LABORATORIES: White blood cells 11, hemoglobin 13,  hematocrit 39.5, platelets 381. Sodium 138, potassium 4.1, chloride 103, bicarbonate 29, BUN 19, creatinine 1.48. Troponin less than 0.02. INR is 1.3. BNP 781.   Chest x-ray shows left lower lobe infiltrate. EKG, no ST elevation or depression.   ASSESSMENT AND PLAN: A 74 year old male with a history of  coronary artery disease status post a stent about a year ago, recently treated for pneumonia at [facility] presents with left-sided chest pain with pneumonia.  1. Pneumonia. The patient was recently hospitalized for treatment of pneumonia, looks like he failed treatment. He did not receive antibiotics upon discharge, but probably was treated for five days in the hospital due to his recent hospital  exposure, I put him on Zosyn and Levaquin. Blood cultures have been ordered. We will continue to monitor.  2. Chest pain. This might be pleuritic in nature. I will try some NSAIDs.  Also I will continue to follow his cardiac enzymes as he does have a history of coronary artery disease and stents and consult cardiology.  3. History of coronary artery disease. We will continue his outpatient medications.  4. Acute kidney injury. His baseline is about 1.3, so not too far from his baseline. We will continue to monitor. Provide some gentle hydration.  5. Hypothyroidism. Continue Synthroid.  6. Hyperlipidemia. Continue atorvastatin.   CODE STATUS: THE PATIENT IS A FULL CODE.   TIME SPENT: Approximately 45 minutes.    ____________________________ Donell Beers. Benjie Karvonen, MD spm:sg D: 06/02/2013 11:01:00 ET T: 06/02/2013 13:35:10 ET JOB#: [id number]  cc: Aaminah Forrester P. Benjie Karvonen, MD, <Dictator> [place] P Tamar Lipscomb MD ELECTRONICALLY SIGNED 06/02/2013 14:16

## 2014-08-16 NOTE — Consult Note (Signed)
PATIENT NAME:  [name], [name] MR#:  [id number] DATE OF BIRTH:  05/08/1940  DATE OF CONSULTATION:  06/02/2013  CARDIOLOGY CONSULTATION  CONSULTING PHYSICIAN:  Denice Bors. Crenshaw, MD  HISTORY OF PRESENT ILLNESS: The patient is a 74 year old male with past medical history of coronary artery disease status post coronary artery bypass graft, peripheral vascular disease, COPD and hypertension who I am asked to evaluate for chest pain. The patient is status post coronary artery bypass graft in March 2013. At that time he had a LIMA to the LAD, saphenous vein graft to the PDA and a saphenous vein graft to the obtuse marginal. He also has peripheral vascular disease with prior stenting of his left subclavian, left axillary and bilateral common iliac stenting. He also has a history of mild aortic stenosis. Echocardiogram in March 2014 showed normal LV function. There was mild aortic stenosis with a mean gradient of 13 mmHg. There was mild mitral regurgitation. The patient also has renal artery stenosis bilaterally. He has also had previous right-sided deep vein thrombosis and prior IVC filter was placed. The patient was admitted to [facility] in January 2015 with complaints of fever, malaise and dyspnea. The patient was checked for influenza and the tests were negative. He was treated with Rocephin and azithromycin. During that admission the patient states that he developed increasing lower extremity edema. He ultimately required Lasix. At the time of discharge he felt much better, but still had lower extremity edema. He was taking Lasix at home and this resolved. He was discharged three days ago. The patient felt well until approximately 4:00 a.m. today when he developed pain in the left upper chest area. It was described as a dull ache. It increased with certain movements and moving his left upper extremity. He now denies fevers, chills, productive cough, hemoptysis. He was admitted by primary care for  possible pneumonia and chest pain and cardiology was asked to evaluate. Note, prior to his recent pneumonia. The patient has mild dyspnea on exertion, but no orthopnea, PND, pedal edema, palpitations, syncope or exertional chest pain.   ALLERGIES: HE HAS AN ALLERGY TO PLAVIX.   SOCIAL HISTORY: He has a long history of tobacco use but has not smoked in three years.   FAMILY HISTORY: Negative for coronary artery disease.   PAST MEDICAL HISTORY: Significant for hypertension but there is no diabetes mellitus. He does have hyperlipidemia. He has COPD as well. He also has hypothyroidism. He had an  intraventricular hemorrhage previously. He has a history of coronary artery disease and is status post coronary artery bypass graft as outlined above. He has peripheral arterial disease as outlined in the HPI. He has had a previous right leg deep venous thrombosis followed by IVC filter placement. He also has arthritis and history of mild aortic stenosis. He has bilateral renal artery stenosis. He has had previous cholecystectomy as well as hernia repair.   REVIEW OF SYSTEMS: He denies any headaches. Fevers have resolved and he is not having chills. He is not having a productive cough or hemoptysis. There is no dysphagia, odynophagia, melena or hematochezia. There is no dysuria or hematuria. There is no rashes or seizure activity. There is no orthopnea, PND or pedal edema. The remaining systems are negative at present other than  mild malaise.   PHYSICAL EXAMINATION: VITAL SIGNS: The patient is well-developed and well-nourished in no acute distress.  Skin is warm and dry. He does not appear to be depressed and there is no peripheral  clubbing.  BACK: His back is normal.  HEENT: Normal with normal eyelids.  NECK: Supple with a normal upstroke bilaterally and no bruits noted. There is no jugular venous distention and I cannot appreciate thyromegaly.  CHEST: Shows a mild diffuse expiratory wheeze. There is mildly  diminished breath sounds throughout.  CARDIOVASCULAR: Regular rate and rhythm with a normal S1 and S2. I cannot appreciate murmurs, rubs or gallops. There is a previous sternotomy.  ABDOMEN: Not tender nontender. Positive bowel sounds. No hepatosplenomegaly. No masses appreciated. There is no abdominal bruit. He has 2+ femoral pulses bilaterally and no bruits.  EXTREMITIES: Show no edema, and I can palpate no cords. He has diminished distal pulses.  NEUROLOGIC: Grossly intact.   LABORATORY DATA: His electrocardiogram shows sinus rhythm with no acute ST changes. His initial troponin is negative. His white blood cell count 11, hemoglobin 13, hematocrit 39.5. His platelet count is 381. BUN is 19 with a creatinine of 1.48. Glucose is 91. Sodium is 138 with a potassium of 4.1. His BNP is 781. Liver functions are normal. Chest x-ray shows a left basilar infiltrate.   DIAGNOSES: 1. Chest pain. The patient's symptoms are unlikely to be cardiac. They increase with certain movements and moving his left upper extremity. I think it is most likely musculoskeletal in etiology. His electrocardiogram shows no acute ST changes. Continue to cycle enzymes and if negative. I would not pursue further cardiac evaluation. He is scheduled for a VQ scan as well. I agree with avoiding CTA given baseline renal insufficiency.  2. Pneumonia - the patient has a left infiltrate on chest x-ray. Possible pneumonia. He has been initiated on antibiotics by primary care, and we will leave this management to them.  3. Coronary artery disease: Continue aspirin and statin.  4. Peripheral vascular disease including prior subclavian stent, renal artery stenosis, and previous  peripheral vascular stenting - continue aspirin and statin.  5. Paroxysmal atrial fibrillation. During his recent admission to [facility] for pneumonia he was noted to be in atrial fibrillation in the Emergency Room transiently. He did convert to sinus rhythm. He  has multiple risk factors for an embolic event. However, he apparently has had an intracranial hemorrhage and was felt not to be an anticoagulation candidate with his previous deep vein thrombosis. An IVC filter was instead placed. I would recommend continuing his beta blocker and aspirin for now. I will ask Dr. Fletcher Anon to review this for further suggestions. At present he is in sinus rhythm.  6. Chronic diastolic congestive heart failure - the patient apparently developed volume excess during his recent hospitalization. I wonder if that may have been related to IV fluids used  during his treatment for his pneumonia. His volume status appears to be improving. Continue present dose of Lasix for now but most likely will not require this long term.  7. Hypertension - continue present medications and follow blood pressure.  8. Chronic obstructive pulmonary disease, continue pulmonary toilet management - per primary care.  9. Renal insufficiency - his creatinine appears to be mildly increased compared to his recent discharge. We will follow closely. Note, the patient also has a history of bilateral renal artery stenosis and Dr. Fletcher Anon is planning to intervene in the future to potentially preserve renal function. He will follow up with Dr. Fletcher Anon for this issue following discharge.  10. Hyperlipidemia - continue statin.  ____________________________ Denice Bors. Stanford Breed, MD bsc:sg D: 06/02/2013 12:32:47 ET T: 06/02/2013 13:04:18 ET JOB#: [id number]  cc:  Denice Bors Stanford Breed, MD, <Dictator> Lelon Perla MD ELECTRONICALLY SIGNED 06/03/2013 12:03

## 2014-09-01 ENCOUNTER — Other Ambulatory Visit: Payer: Self-pay | Admitting: Family Medicine

## 2014-10-07 ENCOUNTER — Other Ambulatory Visit (INDEPENDENT_AMBULATORY_CARE_PROVIDER_SITE_OTHER): Payer: Medicare Other

## 2014-10-07 DIAGNOSIS — E038 Other specified hypothyroidism: Secondary | ICD-10-CM | POA: Diagnosis not present

## 2014-10-07 LAB — TSH: TSH: 0.07 u[IU]/mL — ABNORMAL LOW (ref 0.35–4.50)

## 2014-10-07 LAB — T3, FREE: T3, Free: 2.8 pg/mL (ref 2.3–4.2)

## 2014-10-07 LAB — T4, FREE: Free T4: 1.74 ng/dL — ABNORMAL HIGH (ref 0.60–1.60)

## 2014-10-08 ENCOUNTER — Other Ambulatory Visit: Payer: Self-pay | Admitting: Cardiovascular Disease

## 2014-10-08 DIAGNOSIS — I739 Peripheral vascular disease, unspecified: Secondary | ICD-10-CM

## 2014-10-08 DIAGNOSIS — I701 Atherosclerosis of renal artery: Secondary | ICD-10-CM

## 2014-10-09 ENCOUNTER — Ambulatory Visit (INDEPENDENT_AMBULATORY_CARE_PROVIDER_SITE_OTHER): Payer: Medicare Other

## 2014-10-09 DIAGNOSIS — I739 Peripheral vascular disease, unspecified: Secondary | ICD-10-CM | POA: Diagnosis not present

## 2014-10-09 DIAGNOSIS — I701 Atherosclerosis of renal artery: Secondary | ICD-10-CM | POA: Diagnosis not present

## 2014-10-13 ENCOUNTER — Other Ambulatory Visit: Payer: Self-pay

## 2014-10-13 DIAGNOSIS — I6523 Occlusion and stenosis of bilateral carotid arteries: Secondary | ICD-10-CM

## 2014-10-29 ENCOUNTER — Telehealth: Payer: Self-pay | Admitting: *Deleted

## 2014-10-29 MED ORDER — LEVOTHYROXINE SODIUM 175 MCG PO TABS: 175.0000 ug | ORAL_TABLET | Freq: Every day | ORAL | Status: DC

## 2014-10-29 NOTE — Telephone Encounter (Signed)
-----   Message from Owens Loffler, MD sent at 10/29/2014  5:45 PM EDT ----- Can you call him:  I am confused - I thought I sent him a MyChart message, but I may have done it incorrectly.  His thyroid level came back slightly high (Free T4) and TSH low. I would suggest that we drop down to the next level thyroid dose, which would be 175 micrograms.  Synthroid 175 micrograms. 1 po daily, #30, 5 refills.  We should check levels in a couple of months

## 2014-10-29 NOTE — Telephone Encounter (Signed)
Mr. Emanuelson notified as instructed by telephone.  Levothyroxine 175 mcg prescription sent in to [facility].

## 2014-11-12 ENCOUNTER — Telehealth: Payer: Self-pay | Admitting: *Deleted

## 2014-11-12 NOTE — Telephone Encounter (Signed)
Lmom to call our office. Time to schedule a carotid u/s (1yr f/u). 

## 2014-11-28 ENCOUNTER — Other Ambulatory Visit: Payer: Self-pay | Admitting: Cardiovascular Disease

## 2014-12-05 ENCOUNTER — Other Ambulatory Visit: Payer: Self-pay | Admitting: Cardiovascular Disease

## 2014-12-05 DIAGNOSIS — I6523 Occlusion and stenosis of bilateral carotid arteries: Secondary | ICD-10-CM

## 2014-12-15 ENCOUNTER — Ambulatory Visit (INDEPENDENT_AMBULATORY_CARE_PROVIDER_SITE_OTHER): Payer: Medicare Other

## 2014-12-15 DIAGNOSIS — I6523 Occlusion and stenosis of bilateral carotid arteries: Secondary | ICD-10-CM

## 2014-12-19 ENCOUNTER — Other Ambulatory Visit: Payer: Self-pay

## 2014-12-19 DIAGNOSIS — I6529 Occlusion and stenosis of unspecified carotid artery: Secondary | ICD-10-CM

## 2015-01-10 ENCOUNTER — Other Ambulatory Visit: Payer: Self-pay | Admitting: Cardiovascular Disease

## 2015-01-26 ENCOUNTER — Ambulatory Visit (INDEPENDENT_AMBULATORY_CARE_PROVIDER_SITE_OTHER): Payer: Medicare Other | Admitting: Cardiovascular Disease

## 2015-01-26 ENCOUNTER — Encounter: Payer: Self-pay | Admitting: Cardiovascular Disease

## 2015-01-26 VITALS — BP 140/72 | HR 76 | Ht 69.0 in | Wt 175.0 lb

## 2015-01-26 DIAGNOSIS — I10 Essential (primary) hypertension: Secondary | ICD-10-CM

## 2015-01-26 DIAGNOSIS — I2510 Atherosclerotic heart disease of native coronary artery without angina pectoris: Secondary | ICD-10-CM | POA: Diagnosis not present

## 2015-01-26 DIAGNOSIS — I714 Abdominal aortic aneurysm, without rupture, unspecified: Secondary | ICD-10-CM

## 2015-01-26 DIAGNOSIS — I739 Peripheral vascular disease, unspecified: Secondary | ICD-10-CM

## 2015-01-26 DIAGNOSIS — I701 Atherosclerosis of renal artery: Secondary | ICD-10-CM | POA: Diagnosis not present

## 2015-01-26 MED ORDER — CARVEDILOL 6.25 MG PO TABS: 6.2500 mg | ORAL_TABLET | Freq: Two times a day (BID) | ORAL | Status: DC

## 2015-01-26 NOTE — Progress Notes (Signed)
Primary care physician: [name]. [name]  HPI  [name] is a pleasant 74 year old gentleman who is here today for a followup visit regarding extensive peripheral arterial disease with previous stenting of the left subclavian artery and left axillary artery, bilateral common iliac artery stenting and  left renal artery stenting. The patient has multiple medical problems including hyperlipidemia, long smoking history for 60 years, COPD , carotid showed 40% to 59% bilateral disease, and atherosclerotic coronary artery disease status post CABG in March of 2013 with LIMA to the LAD, vein graft to the PDA, vein graft to the OM. He does have mild aortic valve stenosis.  He had a small intracranial hemorrhage in 2014 which was suspected to be due to malignant hypertension and dual antiplatelet therapy. Shortly after that he was diagnosed with extensive right-sided DVT. Anticoagulation was contraindicated and thus an IVC filter was placed.  He underwent bilateral common iliac artery stenting in September of 2014 due to lifestyle limiting claudication. Angiography at that time showed significant bilateral renal artery stenosis worse on the left side, significant distal aortic stenosis extending into ostial common iliac artery bilaterally, significant distal right SFA disease with evidence of AV fistula from the proximal popliteal artery. There was diffuse left SFA disease.  His claudication improved significantly since then with minimal symptoms at this time.  He had worsening heart failure, hypertension and chronic kidney disease thought to be due to renal artery stenosis. Thus, I proceeded with angiography in 06/2013 which showed 95% stenosis in the left renal artery and 60% stenosis in the right renal artery. Iliac stents were patent. I performed stenting of the left renal artery without complications. He has been doing very well since then. He has been doing well overall and denies any chest pain or significant  dyspnea. He goes to the gym 2-3 times per week with no claudication.    Allergies  Allergen Reactions  . Plavix [Clopidogrel Bisulfate]     Brain hemorrhage prev while on plavix and aspirin     Current Outpatient Prescriptions on File Prior to Visit  Medication Sig Dispense Refill  . amLODipine (NORVASC) 5 MG tablet TAKE ONE TABLET BY MOUTH EVERY DAY 30 tablet 1  . aspirin EC 81 MG tablet Start 1 tablet daily a week from 08/01/13 90 tablet 3  . atorvastatin (LIPITOR) 20 MG tablet TAKE ONE TABLET BY MOUTH EVERY DAY AT 6PM. 30 tablet 1  . carvedilol (COREG) 3.125 MG tablet TAKE 1 TABLET TWICE DAILY WITH MEALS 180 tablet 3  . levothyroxine (SYNTHROID, LEVOTHROID) 175 MCG tablet Take 1 tablet (175 mcg total) by mouth daily before breakfast. 30 tablet 5  . [DISCONTINUED] Fluticasone-Salmeterol (ADVAIR DISKUS) 250-50 MCG/DOSE AEPB Inhale 1 puff into the lungs 2 (two) times daily. 1 each 11  . [DISCONTINUED] pravastatin (PRAVACHOL) 40 MG tablet Take 1 tablet (40 mg total) by mouth daily. 30 tablet 6   No current facility-administered medications on file prior to visit.     Past Medical History  Diagnosis Date  . Venous insufficiency   . Peripheral arterial disease ([place])     a. Previous left lower extremity stenting by [name];  b. 12/2012 s/p bilat ostial common iliac stenting.  . Tobacco abuse     d/c 2012  . COPD (chronic obstructive pulmonary disease) ([place])   . Hypothyroidism   . Subclavian artery stenosis, left 05/2012    Status post stenting of the ostium and self-expanding stent placement to the left axillary artery  .  Right leg DVT ([place]) 08/13/2012  . Intraventricular hemorrhage ([place]) 07/12/2012  . Hypertension   . CHF (congestive heart failure) ([place])   . Arthritis     "hands; not dx'd" (12/26/2012)  . Bilateral renal artery stenosis ([place])     a. 06/2013 Angio/PTA: LRA: 95 (5x18 Herculink stent), RRA 60ost, celiac/SMA nl, RCIA patent stents extending into dist Ao, RIIA 70ost,  REIA min irregs, LCIA patent stent, LIIA 60-70ost, LEIA min irregs.  . Aortic calcification ([place]) 05/01/2013  . Pneumonia   . Coronary artery disease      Past Surgical History  Procedure Laterality Date  . Angioplasty / stenting femoral  05/2012  . Coronary artery bypass graft  07/11/2011    Procedure: CORONARY ARTERY BYPASS GRAFTING (CABG);  Surgeon: [name] Isaac, MD;  Location: [facility];  Service: Open Heart Surgery;  Laterality: N/A;  Times 3. On Pump. Using right greater saphenous vein and left internal mammary artery.   . Cardiac catheterization  2/14    [facility]  . Subclavian artery stent  05/2012    "2 stents" (12/26/2012)  . Vena cava filter placement  07/2012    Removable  . Angioplasty / stenting iliac Bilateral 12/26/2012  . Cholecystectomy  1990's  . Hernia repair    . Renal artery angioplasty Left 07/10/2013    [name]  . Abdominal angiogram  07/10/2013    WITH BI-FEMORAL RUNOFF       [name]  . Left and right heart catheterization with coronary angiogram  07/07/2011    Procedure: LEFT AND RIGHT HEART CATHETERIZATION WITH CORONARY ANGIOGRAM;  Surgeon: [name] Merritts, MD;  Location: [facility] CATH LAB;  Service: Cardiovascular;;  . Arch aortogram N/A 06/20/2012    Procedure: ARCH AORTOGRAM;  Surgeon: [name] Hampshire, MD;  Location: [facility] CATH LAB;  Service: Cardiovascular;  Laterality: N/A;  . Percutaneous stent intervention  06/20/2012    Procedure: PERCUTANEOUS STENT INTERVENTION;  Surgeon: [name] Hampshire, MD;  Location: [facility] CATH LAB;  Service: Cardiovascular;;  . Abdominal aortagram N/A 12/26/2012    Procedure: ABDOMINAL Maxcine Ham;  Surgeon: [name] Hampshire, MD;  Location: [facility] CATH LAB;  Service: Cardiovascular;  Laterality: N/A;  . Insertion of iliac stent Bilateral 12/26/2012    Procedure: INSERTION OF ILIAC STENT;  Surgeon: [name] Hampshire, MD;  Location: [facility] CATH LAB;  Service: Cardiovascular;  Laterality: Bilateral;  Bilateral Common Iliac Artery  . Abdominal aortagram N/A 07/10/2013     Procedure: ABDOMINAL Maxcine Ham;  Surgeon: [name] Hampshire, MD;  Location: [facility] CATH LAB;  Service: Cardiovascular;  Laterality: N/A;     Family History  Problem Relation Age of Onset  . Alcohol abuse Father   . Cirrhosis Father   . Hypothyroidism Sister      Social History   Social History  . Marital Status: Married    Spouse Name: N/A  . Number of Children: 2  . Years of Education: N/A   Occupational History  . Retired     Land   Social History Main Topics  . Smoking status: Former Smoker -- 1.00 packs/day for 50 years    Types: Cigarettes    Quit date: 04/27/2011  . Smokeless tobacco: Never Used  . Alcohol Use: No  . Drug Use: No  . Sexual Activity: Not Currently   Other Topics Concern  . Not on file   Social History Narrative   Exercsiing 3 times a week.    Moderate diet control.    O living  will, no HCPOA.      PHYSICAL EXAM   BP 140/72 mmHg  Pulse 76  Ht 5\' 9"  (1.753 m)  Wt 175 lb (79.379 kg)  BMI 25.83 kg/m2 Constitutional: He is oriented to person, place, and time. He appears well-developed and well-nourished. No distress.  HENT: No nasal discharge.  Head: Normocephalic and atraumatic.  Eyes: Pupils are equal and round. Right eye exhibits no discharge. Left eye exhibits no discharge.  Neck: Normal range of motion. Neck supple. No JVD present. No thyromegaly present. There is left carotid bruit. There is a bruit in the left subclavian artery area. Cardiovascular: Normal rate, regular rhythm, normal heart sounds and. Exam reveals no gallop and no friction rub. There is 2/6 systolic ejection murmur at the aortic area.  Pulmonary/Chest: Effort normal and breath sounds normal. No stridor. No respiratory distress. He has no wheezes. He has no rales. He exhibits no tenderness.  Abdominal: Soft. Bowel sounds are normal. He exhibits no distension. There is no tenderness. There is no rebound and no guarding. There is a bruit on the right side    Musculoskeletal: Normal range of motion. He exhibits no edema and no tenderness.  Neurological: He is alert and oriented to person, place, and time. Coordination normal.  Skin: Skin is warm and dry. No rash noted. He is not diaphoretic. No erythema. No pallor.  Psychiatric: He has a normal mood and affect. His behavior is normal. Judgment and thought content normal.  Vascular: Femoral pulses +1 on the right side and +2 on the left side. Radial pulse: +2 on the right side and +1 on the left side    EKG: Sinus  Rhythm  -  Nonspecific T-abnormality.   ABNORMAL     ASSESSMENT AND PLAN

## 2015-01-26 NOTE — Assessment & Plan Note (Signed)
Most recent ultrasound showed patent left renal artery stent. However, there continues to be progression of right renal artery stenosis. His blood pressure is reasonably controlled. Most recent creatinine was also stable. Thus, there is no indication for revascularization. We will have to keep a close eye on his blood pressure and kidney function. I am going to bring him back in 6 months and recheck his basic metabolic profile.

## 2015-01-26 NOTE — Assessment & Plan Note (Signed)
He has no symptoms suggestive of angina. 

## 2015-01-26 NOTE — Patient Instructions (Addendum)
Medication Instructions:  INCREASE coreg to 6.25mg  twice per day  Labwork: BMET - at six month follow up visit  Testing/Procedures: none  Follow-Up: Your physician wants you to follow-up in: six month with Dr. Fletcher Anon.  You will receive a reminder letter in the mail two months in advance. If you don't receive a letter, please call our office to schedule the follow-up appointment.   Any Other Special Instructions Will Be Listed Below (If Applicable).

## 2015-01-26 NOTE — Assessment & Plan Note (Signed)
This was still small and most recent ultrasound. Continue to monitor on a yearly basis.

## 2015-01-26 NOTE — Assessment & Plan Note (Signed)
Iliac stents were patent. He is known to have bilateral SFA disease with no significant claudication overall. He had previous left subclavian stent placement which was patent on recent ultrasound. Left radial pulse is normal.

## 2015-02-06 DIAGNOSIS — Z23 Encounter for immunization: Secondary | ICD-10-CM | POA: Diagnosis not present

## 2015-02-10 ENCOUNTER — Other Ambulatory Visit: Payer: Self-pay | Admitting: Cardiovascular Disease

## 2015-03-12 ENCOUNTER — Other Ambulatory Visit: Payer: Self-pay | Admitting: Cardiovascular Disease

## 2015-04-06 ENCOUNTER — Other Ambulatory Visit: Payer: Self-pay | Admitting: Family Medicine

## 2015-07-28 ENCOUNTER — Other Ambulatory Visit (INDEPENDENT_AMBULATORY_CARE_PROVIDER_SITE_OTHER): Payer: Medicare Other

## 2015-07-28 DIAGNOSIS — I10 Essential (primary) hypertension: Secondary | ICD-10-CM

## 2015-07-29 LAB — BASIC METABOLIC PANEL
BUN/Creatinine Ratio: 13 (ref 10–24)
BUN: 18 mg/dL (ref 8–27)
CALCIUM: 9.4 mg/dL (ref 8.6–10.2)
CO2: 23 mmol/L (ref 18–29)
CREATININE: 1.35 mg/dL — AB (ref 0.76–1.27)
Chloride: 101 mmol/L (ref 96–106)
GFR, EST AFRICAN AMERICAN: 59 mL/min/{1.73_m2} — AB (ref >59)
GFR, EST NON AFRICAN AMERICAN: 51 mL/min/{1.73_m2} — AB (ref >59)
GLUCOSE: 85 mg/dL (ref 65–99)
POTASSIUM: 5.4 mmol/L — AB (ref 3.5–5.2)
Sodium: 140 mmol/L (ref 134–144)

## 2015-08-03 ENCOUNTER — Encounter: Payer: Self-pay | Admitting: Cardiovascular Disease

## 2015-08-03 ENCOUNTER — Ambulatory Visit (INDEPENDENT_AMBULATORY_CARE_PROVIDER_SITE_OTHER): Payer: Medicare Other | Admitting: Cardiovascular Disease

## 2015-08-03 VITALS — BP 150/64 | HR 74 | Ht 69.0 in | Wt 177.5 lb

## 2015-08-03 DIAGNOSIS — I714 Abdominal aortic aneurysm, without rupture, unspecified: Secondary | ICD-10-CM

## 2015-08-03 DIAGNOSIS — I2510 Atherosclerotic heart disease of native coronary artery without angina pectoris: Secondary | ICD-10-CM

## 2015-08-03 DIAGNOSIS — R0602 Shortness of breath: Secondary | ICD-10-CM | POA: Diagnosis not present

## 2015-08-03 DIAGNOSIS — I255 Ischemic cardiomyopathy: Secondary | ICD-10-CM

## 2015-08-03 DIAGNOSIS — I10 Essential (primary) hypertension: Secondary | ICD-10-CM

## 2015-08-03 DIAGNOSIS — R42 Dizziness and giddiness: Secondary | ICD-10-CM

## 2015-08-03 NOTE — Progress Notes (Signed)
Cardiology Office Note   Date:  08/03/2015   ID:  [name], DOB [birth year], MRN [id number]  PCP:  Owens Loffler, MD  Cardiologist:   [name], MD   Chief Complaint  Patient presents with  . other    6 month follow up. Meds reviewed by the patient verbally. "doing well." Pt. c/o shortness of breath with over exertion and occas. dizziness.       History of Present Illness: [name] is a 75 y.o. male who presents for a followup visit regarding extensive peripheral arterial disease with previous stenting of the left subclavian artery and left axillary artery, bilateral common iliac artery stenting and  left renal artery stenting. The patient has multiple medical problems including hyperlipidemia, Small AAA, long smoking history for 60 years, COPD , carotid showed 40% to 59% bilateral disease, and atherosclerotic coronary artery disease status post CABG in March of 2013 with LIMA to the LAD, vein graft to the PDA, vein graft to the OM. He does have mild aortic valve stenosis.  He had a small intracranial hemorrhage in 2014 which was suspected to be due to malignant hypertension and dual antiplatelet therapy. Shortly after that he was diagnosed with extensive right-sided DVT. Anticoagulation was contraindicated and thus an IVC filter was placed.   He has been doing reasonably well and denies any chest pain. He has stable exertional dyspnea and bilateral mild calf claudication.   Past Medical History  Diagnosis Date  . Venous insufficiency   . Peripheral arterial disease ([place])     a. Previous left lower extremity stenting by Dr. Jamal Collin;  b. 12/2012 s/p bilat ostial common iliac stenting.  . Tobacco abuse     d/c 2012  . COPD (chronic obstructive pulmonary disease) ([place])   . Hypothyroidism   . Subclavian artery stenosis, left 05/2012    Status post stenting of the ostium and self-expanding stent placement to the left axillary artery  . Right leg DVT ([place]) 08/13/2012  .  Intraventricular hemorrhage ([place]) 07/12/2012  . Hypertension   . CHF (congestive heart failure) ([place])   . Arthritis     "hands; not dx'd" (12/26/2012)  . Bilateral renal artery stenosis ([place])     a. 06/2013 Angio/PTA: LRA: 95 (5x18 Herculink stent), RRA 60ost, celiac/SMA nl, RCIA patent stents extending into dist Ao, RIIA 70ost, REIA min irregs, LCIA patent stent, LIIA 60-70ost, LEIA min irregs.  . Aortic calcification ([place]) 05/01/2013  . Pneumonia   . Coronary artery disease     Past Surgical History  Procedure Laterality Date  . Angioplasty / stenting femoral  05/2012  . Coronary artery bypass graft  07/11/2011    Procedure: CORONARY ARTERY BYPASS GRAFTING (CABG);  Surgeon: Grace Isaac, MD;  Location: [facility];  Service: Open Heart Surgery;  Laterality: N/A;  Times 3. On Pump. Using right greater saphenous vein and left internal mammary artery.   . Cardiac catheterization  2/14    [facility]  . Subclavian artery stent  05/2012    "2 stents" (12/26/2012)  . Vena cava filter placement  07/2012    Removable  . Angioplasty / stenting iliac Bilateral 12/26/2012  . Cholecystectomy  1990's  . Hernia repair    . Renal artery angioplasty Left 07/10/2013    DR ARIDA  . Abdominal angiogram  07/10/2013    WITH BI-FEMORAL RUNOFF       DR ARIDA  . Left and right heart catheterization with coronary angiogram  07/07/2011  Procedure: LEFT AND RIGHT HEART CATHETERIZATION WITH CORONARY ANGIOGRAM;  Surgeon: Minna Merritts, MD;  Location: [facility] CATH LAB;  Service: Cardiovascular;;  . Arch aortogram N/A 06/20/2012    Procedure: ARCH AORTOGRAM;  Surgeon: Wellington Hampshire, MD;  Location: [facility] CATH LAB;  Service: Cardiovascular;  Laterality: N/A;  . Percutaneous stent intervention  06/20/2012    Procedure: PERCUTANEOUS STENT INTERVENTION;  Surgeon: Wellington Hampshire, MD;  Location: [facility] CATH LAB;  Service: Cardiovascular;;  . Abdominal aortagram N/A 12/26/2012    Procedure: ABDOMINAL Maxcine Ham;  Surgeon: Wellington Hampshire, MD;   Location: [facility] CATH LAB;  Service: Cardiovascular;  Laterality: N/A;  . Insertion of iliac stent Bilateral 12/26/2012    Procedure: INSERTION OF ILIAC STENT;  Surgeon: Wellington Hampshire, MD;  Location: [facility] CATH LAB;  Service: Cardiovascular;  Laterality: Bilateral;  Bilateral Common Iliac Artery  . Abdominal aortagram N/A 07/10/2013    Procedure: ABDOMINAL Maxcine Ham;  Surgeon: Wellington Hampshire, MD;  Location: [facility] C[facility];  Service: Cardiovascular;  Laterality: N/A;     Current Outpatient Prescriptions  Medication Sig Dispense Refill  . amLODipine (NORVASC) 5 MG tablet TAKE ONE TABLET BY MOUTH EVERY DAY 30 tablet 9  . aspirin EC 81 MG tablet Start 1 tablet daily a week from 08/01/13 90 tablet 3  . atorvastatin (LIPITOR) 20 MG tablet TAKE 1 TABLET BY MOUTH DAILY AT 6PM 30 tablet 6  . carvedilol (COREG) 6.25 MG tablet Take 1 tablet (6.25 mg total) by mouth 2 (two) times daily with a meal. 60 tablet 6  . levothyroxine (SYNTHROID, LEVOTHROID) 175 MCG tablet TAKE ONE TABLET BY MOUTH EVERY DAY BEFORE BREAKFAST 30 tablet 5  . [DISCONTINUED] Fluticasone-Salmeterol (ADVAIR DISKUS) 250-50 MCG/DOSE AEPB Inhale 1 puff into the lungs 2 (two) times daily. 1 each 11  . [DISCONTINUED] pravastatin (PRAVACHOL) 40 MG tablet Take 1 tablet (40 mg total) by mouth daily. 30 tablet 6   No current facility-administered medications for this visit.    Allergies:   Plavix    Social History:  The patient  reports that he quit smoking about 4 years ago. His smoking use included Cigarettes. He has a 50 pack-year smoking history. He has never used smokeless tobacco. He reports that he does not drink alcohol or use illicit drugs.   Family History:  The patient's family history includes Alcohol abuse in his father; Cirrhosis in his father; Hypothyroidism in his sister.    ROS:  Please see the history of present illness.   Otherwise, review of systems are positive for none.   All other systems are reviewed and negative.     PHYSICAL EXAM: VS:  BP 150/64 mmHg  Ht 5\' 9"  (1.753 m)  Wt 177 lb 8 oz (80.513 kg)  BMI 26.20 kg/m2 , BMI Body mass index is 26.2 kg/(m^2). GEN: Well nourished, well developed, in no acute distress HEENT: normal Neck: no JVD or masses. There is left carotid bruit and a bruit in the left subclavian artery area. Cardiac: RRR; no , rubs, or gallops,no edema . 2/6 systolic ejection murmur in the aortic area Respiratory:  clear to auscultation bilaterally, normal work of breathing GI: soft, nontender, nondistended, + BS MS: no deformity or atrophy Skin: warm and dry, no rash Neuro:  Strength and sensation are intact Psych: euthymic mood, full affect   EKG:  EKG is ordered today. The ekg ordered today demonstrates normal sinus rhythm with no significant ST or T wave changes.   Recent Labs: 10/07/2014: TSH  0.07* 07/28/2015: BUN 18; Creatinine, Ser 1.35*; Potassium 5.4*; Sodium 140    Lipid Panel    Component Value Date/Time   CHOL 107 08/01/2014 0916   CHOL 125 01/31/2014 0756   TRIG 70.0 08/01/2014 0916   HDL 48.20 08/01/2014 0916   HDL 63 01/31/2014 0756   CHOLHDL 2 08/01/2014 0916   CHOLHDL 2.0 01/31/2014 0756   VLDL 14.0 08/01/2014 0916   LDLCALC 45 08/01/2014 0916   LDLCALC 46 01/31/2014 0756      Wt Readings from Last 3 Encounters:  08/03/15 177 lb 8 oz (80.513 kg)  01/26/15 175 lb (79.379 kg)  08/06/14 174 lb 4 oz (79.039 kg)        ASSESSMENT AND PLAN:  1.  Coronary artery disease involving native coronary arteries without angina: He is not having any symptoms. Recommend continuing medical therapy.  2. Bilateral carotid artery stenosis: Continue aggressive treatment of risk factors and repeat carotid Doppler in August of this year.  3. Bilateral renal artery stenosis: Status post left renal artery stenting. Duplex from last year has shown progression of right renal artery stenosis but the left renal artery stent was patent. I didn't recent basic metabolic  profile which showed that his kidney function is stable. With relatively controlled blood pressure, there is currently no indication for revascularization.  4. Abdominal aortic aneurysm: This is still small in size. He is due for repeat abdominal aortic ultrasound in June of this year.  5. Essential hypertension: Blood pressure is reasonably controlled.  6. Hyperlipidemia: Continue treatment with atorvastatin. Most recent LDL was 45.  7. Peripheral arterial disease with previous iliac stenting. Residual bilateral calf claudication due to known SFA disease. This has been stable. Continue medical therapy.  8. Status post left subclavian artery stenting. Left radial pulse is normal.     Disposition:   FU with me in 6 months  Signed,  [name], MD  08/03/2015 10:51 AM    [place]

## 2015-08-03 NOTE — Patient Instructions (Addendum)
Medication Instructions:  Your physician recommends that you continue on your current medications as directed. Please refer to the Current Medication list given to you today.   Labwork: none  Testing/Procedures: Your physician has requested that you have a carotid duplex in August. This test is an ultrasound of the carotid arteries in your neck. It looks at blood flow through these arteries that supply the brain with blood. Allow one hour for this exam. There are no restrictions or special instructions.  Your physician has requested that you have an abdominal aorta duplex in June. During this test, an ultrasound is used to evaluate the aorta. Allow 30 minutes for this exam. Do not eat after midnight the day before and avoid carbonated beverages   Follow-Up: Your physician wants you to follow-up in: six months with Dr. Fletcher Anon.  You will receive a reminder letter in the mail two months in advance. If you don't receive a letter, please call our office to schedule the follow-up appointment.   Any Other Special Instructions Will Be Listed Below (If Applicable).     If you need a refill on your cardiac medications before your next appointment, please call your pharmacy.

## 2015-08-10 ENCOUNTER — Ambulatory Visit (INDEPENDENT_AMBULATORY_CARE_PROVIDER_SITE_OTHER): Payer: Medicare Other | Admitting: Family Medicine

## 2015-08-10 ENCOUNTER — Encounter: Payer: Self-pay | Admitting: Family Medicine

## 2015-08-10 VITALS — BP 136/80 | HR 71 | Temp 97.5°F | Ht 68.5 in | Wt 174.8 lb

## 2015-08-10 DIAGNOSIS — E7800 Pure hypercholesterolemia, unspecified: Secondary | ICD-10-CM | POA: Diagnosis not present

## 2015-08-10 DIAGNOSIS — Z79899 Other long term (current) drug therapy: Secondary | ICD-10-CM

## 2015-08-10 DIAGNOSIS — E038 Other specified hypothyroidism: Secondary | ICD-10-CM | POA: Diagnosis not present

## 2015-08-10 DIAGNOSIS — Z0000 Encounter for general adult medical examination without abnormal findings: Secondary | ICD-10-CM

## 2015-08-10 NOTE — Patient Instructions (Signed)
F/u next week for labs

## 2015-08-10 NOTE — Progress Notes (Signed)
Pre visit review using our clinic review tool, if applicable. No additional management support is needed unless otherwise documented below in the visit note. 

## 2015-08-10 NOTE — Progress Notes (Signed)
Dr. Frederico Hamman T. Alexandrina Fiorini, MD, [facility] [place], [address] Phone: [phone] Fax: [phone]  08/10/2015  Patient: [name], MRN: [id number], DOB: [birth year], 75 y.o.  Primary Physician:  Owens Loffler, MD   Chief Complaint  Patient presents with  . Annual Exam    Medicare Wellness  . Bloated   Subjective:   [name] is a 75 y.o. pleasant patient who presents for a medicare wellness examination:  Preventative Health Maintenance Visit:  Health Maintenance Summary Reviewed and updated, unless pt declines services.  Tobacco History Reviewed. Alcohol: No concerns, no excessive use Exercise Habits: gym 3 x a week STD concerns: no risk or activity to increase risk Drug Use: None Encouraged self-testicular check  Health Maintenance  Topic Date Due  . ZOSTAVAX  06/02/2001  . INFLUENZA VACCINE  11/24/2015  . TETANUS/TDAP  09/06/2016  . COLONOSCOPY  01/12/2021  . PNA vac Low Risk Adult  Completed    Immunization History  Administered Date(s) Administered  . Pneumococcal Conjugate-13 02/20/2014  . Pneumococcal Polysaccharide-23 04/11/2008  . Td 09/07/2006    Patient Active Problem List   Diagnosis Date Noted  . CKD (chronic kidney disease) stage 3, GFR 30-59 ml/min 05/01/2013    Priority: High  . Intraventricular hemorrhage ([place]) 07/12/2012    Priority: High  . Ischemic cardiomyopathy 07/08/2011    Priority: High  . CAD (coronary artery disease), native coronary artery 07/07/2011    Priority: High  . COPD (chronic obstructive pulmonary disease) ([place]) 01/08/2010    Priority: High  . Bilateral renal artery stenosis (HCC)     Priority: Medium  . HTN (hypertension) 07/15/2012    Priority: Medium  . Peripheral arterial disease ([place])     Priority: Medium  . Hypothyroidism 09/07/2006    Priority: Medium  . RAS (renal artery stenosis) ([place]) 07/10/2013  . Acute respiratory failure ([place])  05/24/2013  . Paroxysmal a-fib: briefly in ED now in NSR 05/24/2013  . SIRS (systemic inflammatory response syndrome) ([place]) 05/24/2013  . Aortic calcification ([place]) 05/01/2013  . Right leg DVT ([place]) 08/13/2012  . Subclavian arterial stenosis ([place]) 04/25/2011  . BPH (benign prostatic hyperplasia) 12/29/2010  . Abdominal aortic aneurysm ([place]) 11/06/2009  . HYPERCHOLESTEROLEMIA 10/09/2009  . TOBACCO ABUSE 04/11/2008  . PROSTATE SPECIFIC ANTIGEN, ELEVATED 04/11/2008   Past Medical History  Diagnosis Date  . Venous insufficiency   . Peripheral arterial disease ([place])     a. Previous left lower extremity stenting by Dr. Jamal Collin;  b. 12/2012 s/p bilat ostial common iliac stenting.  . Tobacco abuse     d/c 2012  . COPD (chronic obstructive pulmonary disease) ([place])   . Hypothyroidism   . Subclavian artery stenosis, left 05/2012    Status post stenting of the ostium and self-expanding stent placement to the left axillary artery  . Right leg DVT ([place]) 08/13/2012  . Intraventricular hemorrhage ([place]) 07/12/2012  . Hypertension   . CHF (congestive heart failure) ([place])   . Arthritis     "hands; not dx'd" (12/26/2012)  . Bilateral renal artery stenosis ([place])     a. 06/2013 Angio/PTA: LRA: 95 (5x18 Herculink stent), RRA 60ost, celiac/SMA nl, RCIA patent stents extending into dist Ao, RIIA 70ost, REIA min irregs, LCIA patent stent, LIIA 60-70ost, LEIA min irregs.  . Aortic calcification ([place]) 05/01/2013  . Pneumonia   . Coronary artery disease    Past Surgical History  Procedure Laterality Date  . Angioplasty /  stenting femoral  05/2012  . Coronary artery bypass graft  07/11/2011    Procedure: CORONARY ARTERY BYPASS GRAFTING (CABG);  Surgeon: Grace Isaac, MD;  Location: [facility];  Service: Open Heart Surgery;  Laterality: N/A;  Times 3. On Pump. Using right greater saphenous vein and left internal mammary artery.   . Cardiac catheterization  2/14    MC  . Subclavian artery stent  05/2012    "2 stents"  (12/26/2012)  . Vena cava filter placement  07/2012    Removable  . Angioplasty / stenting iliac Bilateral 12/26/2012  . Cholecystectomy  1990's  . Hernia repair    . Renal artery angioplasty Left 07/10/2013    DR ARIDA  . Abdominal angiogram  07/10/2013    WITH BI-FEMORAL RUNOFF       DR ARIDA  . Left and right heart catheterization with coronary angiogram  07/07/2011    Procedure: LEFT AND RIGHT HEART CATHETERIZATION WITH CORONARY ANGIOGRAM;  Surgeon: Minna Merritts, MD;  Location: [facility] CATH LAB;  Service: Cardiovascular;;  . Arch aortogram N/A 06/20/2012    Procedure: ARCH AORTOGRAM;  Surgeon: Wellington Hampshire, MD;  Location: [facility] CATH LAB;  Service: Cardiovascular;  Laterality: N/A;  . Percutaneous stent intervention  06/20/2012    Procedure: PERCUTANEOUS STENT INTERVENTION;  Surgeon: Wellington Hampshire, MD;  Location: [facility] C[facility] LAB;  Service: Cardiovascular;;  . Abdominal aortagram N/A 12/26/2012    Procedure: ABDOMINAL Maxcine Ham;  Surgeon: Wellington Hampshire, MD;  Location: [facility];  Service: Cardiovascular;  Laterality: N/A;  . Insertion of iliac stent Bilateral 12/26/2012    Procedure: INSERTION OF ILIAC STENT;  Surgeon: Wellington Hampshire, MD;  Location: [facility] CATH LAB;  Service: Cardiovascular;  Laterality: Bilateral;  Bilateral Common Iliac Artery  . Abdominal aortagram N/A 07/10/2013    Procedure: ABDOMINAL Maxcine Ham;  Surgeon: Wellington Hampshire, MD;  Location: [facility] C[facility] LAB;  Service: Cardiovascular;  Laterality: N/A;   Social History   Social History  . Marital Status: Married    Spouse Name: N/A  . Number of Children: 2  . Years of Education: N/A   Occupational History  . Retired     Land   Social History Main Topics  . Smoking status: Former Smoker -- 1.00 packs/day for 50 years    Types: Cigarettes    Quit date: 04/27/2011  . Smokeless tobacco: Never Used  . Alcohol Use: No  . Drug Use: No  . Sexual Activity: Not Currently   Other Topics Concern  . Not on file    Social History Narrative   Exercsiing 3 times a week.    Moderate diet control.    O living will, no HCPOA.   Family History  Problem Relation Age of Onset  . Alcohol abuse Father   . Cirrhosis Father   . Hypothyroidism Sister    Allergies  Allergen Reactions  . Plavix [Clopidogrel Bisulfate]     Brain hemorrhage prev while on plavix and aspirin    Medication list has been reviewed and updated.   General: Denies fever, chills, sweats. No significant weight loss. Eyes: Denies blurring,significant itching ENT: Denies earache, sore throat, and hoarseness. Cardiovascular: Denies chest pains, palpitations, dyspnea on exertion Respiratory: Denies cough, dyspnea at rest,wheeezing Breast: no concerns about lumps GI: Denies nausea, vomiting, diarrhea, constipation, change in bowel habits, abdominal pain, melena, hematochezia GU: Denies penile discharge, some ED, no urinary flow / outflow problems. No STD concerns. Musculoskeletal: Denies back pain, joint pain  Derm: Denies rash, itching Neuro: Denies  paresthesias, frequent falls, frequent headaches Psych: Denies depression, anxiety Endocrine: Denies cold intolerance, heat intolerance, polydipsia Heme: Denies enlarged lymph nodes Allergy: No hayfever  Objective:   BP 136/80 mmHg  Pulse 71  Temp(Src) 97.5 F (36.4 C) (Oral)  Ht 5' 8.5" (1.74 m)  Wt 174 lb 12.8 oz (79.289 kg)  BMI 26.19 kg/m2  SpO2 96%  The patient completed a fall screen and PHQ-2 and PHQ-9 if necessary, which is documented in the EHR. The CMA/LPN/RN who assisted the patient verbally completed with them and documented results in [facility].   Hearing Screening   _0  _1  _2  _3  _4  _5  _6   Right ear:   40 40 40 0   Left ear:   40 40 40 0     Visual Acuity Screening   Right eye Left eye Both eyes  Without correction:     With correction: 20/20 20/20   Comments: Pt states that it's been about three years since he last saw  his eye doctor.    GEN: well developed, well nourished, no acute distress Eyes: conjunctiva and lids normal, PERRLA, EOMI ENT: TM clear, nares clear, oral exam WNL Neck: supple, no lymphadenopathy, no thyromegaly, no JVD Pulm: clear to auscultation and percussion, respiratory effort normal CV: regular rate and rhythm, S1-S2, no murmur, rub or gallop, no bruits, peripheral pulses normal and symmetric, no cyanosis, clubbing, edema or varicosities GI: soft, non-tender; no hepatosplenomegaly, masses; active bowel sounds all quadrants GU: defer Lymph: no cervical, axillary or inguinal adenopathy MSK: gait normal, muscle tone and strength WNL, no joint swelling, effusions, discoloration, crepitus  SKIN: clear, good turgor, color WNL, no rashes, lesions, or ulcerations Neuro: normal mental status, normal strength, sensation, and motion Psych: alert; oriented to person, place and time, normally interactive and not anxious or depressed in appearance.  All labs reviewed with patient.  Lipids:    Component Value Date/Time   CHOL 107 08/01/2014 0916   CHOL 125 01/31/2014 0756   TRIG 70.0 08/01/2014 0916   HDL 48.20 08/01/2014 0916   HDL 63 01/31/2014 0756   VLDL 14.0 08/01/2014 0916   CHOLHDL 2 08/01/2014 0916   CHOLHDL 2.0 01/31/2014 0756   CBC: CBC Latest Ref Rng 08/01/2014 06/27/2013 06/03/2013  WBC 4.0 - 10.5 K/uL 6.5 6.5 6.1  Hemoglobin 13.0 - 17.0 g/dL 16.2 14.4 12.8(L)  Hematocrit 39.0 - 52.0 % 47.7 43.6 37.3(L)  Platelets 150.0 - 400.0 K/uL 212.0 204 132    Basic Metabolic Panel:    Component Value Date/Time   NA 140 07/28/2015 1006   NA 139 08/01/2014 0916   NA 136 06/03/2013 0454   K 5.4* 07/28/2015 1006   K 4.1 06/03/2013 0454   CL 101 07/28/2015 1006   CL 106 06/03/2013 0454   CO2 23 07/28/2015 1006   CO2 27 06/03/2013 0454   BUN 18 07/28/2015 1006   BUN 22 08/01/2014 0916   BUN 22* 06/03/2013 0454   CREATININE 1.35* 07/28/2015 1006   CREATININE 1.59* 06/03/2013 0454    CREATININE 1.02 04/22/2011 1507   GLUCOSE 85 07/28/2015 1006   GLUCOSE 88 08/01/2014 0916   GLUCOSE 80 06/03/2013 0454   CALCIUM 9.4 07/28/2015 1006   CALCIUM 8.8 06/03/2013 0454   Hepatic Function Latest Ref Rng 08/01/2014 01/31/2014 06/03/2013  Total Protein 6.0 - 8.3 g/dL 6.4 6.5 6.7  Albumin 3.5 - 5.2 g/dL 4.0 4.2 2.1(L)  AST 0 - 37 U/L 12 13  34  ALT 0 - 53 U/L 8 7 52  Alk Phosphatase 39 - 117 U/L 99 94 97  Total Bilirubin 0.2 - 1.2 mg/dL 0.7 0.5 0.6  Bilirubin, Direct 0.0 - 0.3 mg/dL 0.2 0.15 -    Lab Results  Component Value Date   TSH 0.07* 10/07/2014   Lab Results  Component Value Date   PSA 3.32 08/01/2014   PSA 2.58 11/21/2011   PSA 2.79 09/29/2009    Assessment and Plan:   Routine general medical examination at a health care facility  Other specified hypothyroidism - Plan: TSH, T3, free, T4, free  HYPERCHOLESTEROLEMIA - Plan: Lipid panel  Encounter for long-term (current) use of medications - Plan: Basic metabolic panel, CBC with Differential/Platelet, Hepatic function panel  Health Maintenance Exam: The patient's preventative maintenance and recommended screening tests for an annual wellness exam were reviewed in full today. Brought up to date unless services declined.  Counselled on the importance of diet, exercise, and its role in overall health and mortality. The patient's FH and SH was reviewed, including their home life, tobacco status, and drug and alcohol status.  I have personally reviewed the Medicare Annual Wellness questionnaire and have noted 1. The patient's medical and social history 2. Their use of alcohol, tobacco or illicit drugs 3. Their current medications and supplements 4. The patient's functional ability including ADL's, fall risks, home safety risks and hearing or visual             impairment. 5. Diet and physical activities 6. Evidence for depression or mood disorders 7. Reviewed Updated provider list, see scanned forms and CHL  Snapshot.   The patients weight, height, BMI and visual acuity have been recorded in the chart I have made referrals, counseling and provided education to the patient based review of the above and I have provided the pt with a written personalized care plan for preventive services.  I have provided the patient with a copy of your personalized plan for preventive services. Instructed to take the time to review along with their updated medication list.  Overall doing well  Follow-up: No Follow-up on file. Or follow-up in 1 year for complete physical examination  Orders Placed This Encounter  Procedures  . Basic metabolic panel  . CBC with Differential/Platelet  . Hepatic function panel  . Lipid panel  . TSH  . T3, free  . T4, free    Signed,  Kenyette Gundy T. Ansleigh Safer, MD   Patient's Medications  New Prescriptions   No medications on file  Previous Medications   AMLODIPINE (NORVASC) 5 MG TABLET    TAKE ONE TABLET BY MOUTH EVERY DAY   ASPIRIN EC 81 MG TABLET    Start 1 tablet daily a week from 08/01/13   ATORVASTATIN (LIPITOR) 20 MG TABLET    TAKE 1 TABLET BY MOUTH DAILY AT 6PM   CARVEDILOL (COREG) 6.25 MG TABLET    Take 1 tablet (6.25 mg total) by mouth 2 (two) times daily with a meal.   LEVOTHYROXINE (SYNTHROID, LEVOTHROID) 175 MCG TABLET    TAKE ONE TABLET BY MOUTH EVERY DAY BEFORE BREAKFAST  Modified Medications   No medications on file  Discontinued Medications   No medications on file

## 2015-08-18 ENCOUNTER — Other Ambulatory Visit (INDEPENDENT_AMBULATORY_CARE_PROVIDER_SITE_OTHER): Payer: Medicare Other

## 2015-08-18 DIAGNOSIS — E7800 Pure hypercholesterolemia, unspecified: Secondary | ICD-10-CM | POA: Diagnosis not present

## 2015-08-18 DIAGNOSIS — E038 Other specified hypothyroidism: Secondary | ICD-10-CM

## 2015-08-18 DIAGNOSIS — Z79899 Other long term (current) drug therapy: Secondary | ICD-10-CM

## 2015-08-18 LAB — HEPATIC FUNCTION PANEL
ALBUMIN: 4 g/dL (ref 3.5–5.2)
ALK PHOS: 72 U/L (ref 39–117)
ALT: 12 U/L (ref 0–53)
AST: 14 U/L (ref 0–37)
Bilirubin, Direct: 0.1 mg/dL (ref 0.0–0.3)
Total Bilirubin: 0.6 mg/dL (ref 0.2–1.2)
Total Protein: 6.6 g/dL (ref 6.0–8.3)

## 2015-08-18 LAB — BASIC METABOLIC PANEL
BUN: 22 mg/dL (ref 6–23)
CALCIUM: 9.4 mg/dL (ref 8.4–10.5)
CO2: 28 mEq/L (ref 19–32)
CREATININE: 1.41 mg/dL (ref 0.40–1.50)
Chloride: 104 mEq/L (ref 96–112)
GFR: 52.19 mL/min — AB (ref >60.00)
GLUCOSE: 92 mg/dL (ref 70–99)
Potassium: 4.7 mEq/L (ref 3.5–5.1)
Sodium: 140 mEq/L (ref 135–145)

## 2015-08-18 LAB — T4, FREE: Free T4: 1.95 ng/dL — ABNORMAL HIGH (ref 0.60–1.60)

## 2015-08-18 LAB — LIPID PANEL
CHOLESTEROL: 110 mg/dL (ref 0–200)
HDL: 50 mg/dL (ref >39.00)
LDL CALC: 37 mg/dL (ref 0–99)
NonHDL: 60.01
TRIGLYCERIDES: 116 mg/dL (ref 0.0–149.0)
Total CHOL/HDL Ratio: 2
VLDL: 23.2 mg/dL (ref 0.0–40.0)

## 2015-08-18 LAB — CBC WITH DIFFERENTIAL/PLATELET
BASOS ABS: 0 10*3/uL (ref 0.0–0.1)
Basophils Relative: 0.7 % (ref 0.0–3.0)
Eosinophils Absolute: 0.3 10*3/uL (ref 0.0–0.7)
Eosinophils Relative: 4.3 % (ref 0.0–5.0)
HCT: 48.9 % (ref 39.0–52.0)
Hemoglobin: 16.4 g/dL (ref 13.0–17.0)
LYMPHS ABS: 2.8 10*3/uL (ref 0.7–4.0)
Lymphocytes Relative: 42.4 % (ref 12.0–46.0)
MCHC: 33.5 g/dL (ref 30.0–36.0)
MCV: 88.7 fl (ref 78.0–100.0)
MONO ABS: 0.6 10*3/uL (ref 0.1–1.0)
Monocytes Relative: 8.8 % (ref 3.0–12.0)
NEUTROS PCT: 43.8 % (ref 43.0–77.0)
Neutro Abs: 2.8 10*3/uL (ref 1.4–7.7)
Platelets: 198 10*3/uL (ref 150.0–400.0)
RBC: 5.51 Mil/uL (ref 4.22–5.81)
RDW: 14 % (ref 11.5–15.5)
WBC: 6.5 10*3/uL (ref 4.0–10.5)

## 2015-08-18 LAB — TSH: TSH: 0.35 u[IU]/mL (ref 0.35–4.50)

## 2015-08-18 LAB — T3, FREE: T3 FREE: 2.9 pg/mL (ref 2.3–4.2)

## 2015-09-24 ENCOUNTER — Other Ambulatory Visit: Payer: Self-pay | Admitting: Cardiovascular Disease

## 2015-09-24 DIAGNOSIS — I779 Disorder of arteries and arterioles, unspecified: Secondary | ICD-10-CM

## 2015-09-24 DIAGNOSIS — I701 Atherosclerosis of renal artery: Secondary | ICD-10-CM

## 2015-10-01 ENCOUNTER — Ambulatory Visit: Payer: Medicare Other

## 2015-10-01 DIAGNOSIS — I701 Atherosclerosis of renal artery: Secondary | ICD-10-CM

## 2015-10-01 DIAGNOSIS — I6529 Occlusion and stenosis of unspecified carotid artery: Secondary | ICD-10-CM

## 2015-10-01 DIAGNOSIS — I775 Necrosis of artery: Secondary | ICD-10-CM | POA: Diagnosis not present

## 2015-10-01 DIAGNOSIS — I779 Disorder of arteries and arterioles, unspecified: Secondary | ICD-10-CM

## 2015-10-05 ENCOUNTER — Other Ambulatory Visit: Payer: Self-pay | Admitting: Cardiovascular Disease

## 2015-10-06 ENCOUNTER — Other Ambulatory Visit: Payer: Self-pay

## 2015-10-06 DIAGNOSIS — I6523 Occlusion and stenosis of bilateral carotid arteries: Secondary | ICD-10-CM

## 2015-11-04 ENCOUNTER — Other Ambulatory Visit: Payer: Self-pay | Admitting: Family Medicine

## 2015-11-19 ENCOUNTER — Telehealth: Payer: Self-pay | Admitting: Cardiovascular Disease

## 2015-11-19 NOTE — Telephone Encounter (Signed)
S/w pt who asks if he needs AAA on 8/1 as he thought it was done during 6/8 appt.  Per records, pt had renal US, US carotids and LE arterial on that date. No record of AAA. Advised pt to keep upcoming appt.  Pt verbalized understanding w/no further questions.

## 2015-11-19 NOTE — Telephone Encounter (Signed)
Pt spouse calling stating in June patient came in for LE ART and during the visit  Tech told patient they would go ahead and do his AAA ultra sound (for he saw patient was coming back for this) was then told by tech that he would not need this upcoming appointment for that Please advise.

## 2015-11-20 ENCOUNTER — Telehealth: Payer: Self-pay | Admitting: Cardiovascular Disease

## 2015-11-20 NOTE — Telephone Encounter (Signed)
Per Rogue Jury, echo tech, states pt does not need 8/1 appt for AAA duplex as it was completed as part of June 8 renal artery duplex. S/w pt wife, Joycelyn Schmid, who verbalized understanding that pt does not need to keep August 1 appt. She is appreciative of the call with no further questions at this time.

## 2015-11-20 NOTE — Telephone Encounter (Signed)
Wife calling back to clarify .tx [name]

## 2015-11-24 ENCOUNTER — Other Ambulatory Visit: Payer: PRIVATE HEALTH INSURANCE

## 2015-12-07 ENCOUNTER — Other Ambulatory Visit: Payer: Self-pay | Admitting: Cardiovascular Disease

## 2015-12-29 ENCOUNTER — Other Ambulatory Visit: Payer: Self-pay

## 2016-02-18 ENCOUNTER — Other Ambulatory Visit: Payer: Self-pay | Admitting: Cardiovascular Disease

## 2016-02-24 ENCOUNTER — Other Ambulatory Visit: Payer: Self-pay | Admitting: Cardiovascular Disease

## 2016-02-24 NOTE — Telephone Encounter (Signed)
Pt wife called, states pt need refill on carvedilol.

## 2016-03-25 ENCOUNTER — Ambulatory Visit (INDEPENDENT_AMBULATORY_CARE_PROVIDER_SITE_OTHER): Payer: Medicare Other | Admitting: Cardiovascular Disease

## 2016-03-25 ENCOUNTER — Encounter: Payer: Self-pay | Admitting: Cardiovascular Disease

## 2016-03-25 VITALS — BP 162/78 | HR 75 | Ht 69.0 in | Wt 169.8 lb

## 2016-03-25 DIAGNOSIS — I739 Peripheral vascular disease, unspecified: Secondary | ICD-10-CM

## 2016-03-25 DIAGNOSIS — I779 Disorder of arteries and arterioles, unspecified: Secondary | ICD-10-CM

## 2016-03-25 DIAGNOSIS — I2510 Atherosclerotic heart disease of native coronary artery without angina pectoris: Secondary | ICD-10-CM | POA: Diagnosis not present

## 2016-03-25 DIAGNOSIS — I10 Essential (primary) hypertension: Secondary | ICD-10-CM

## 2016-03-25 DIAGNOSIS — E7800 Pure hypercholesterolemia, unspecified: Secondary | ICD-10-CM

## 2016-03-25 NOTE — Patient Instructions (Signed)
Medication Instructions:  Your physician recommends that you continue on your current medications as directed. Please refer to the Current Medication list given to you today.   Labwork: none  Testing/Procedures: Your physician has requested that you have a carotid duplex, June 2018. This test is an ultrasound of the carotid arteries in your neck. It looks at blood flow through these arteries that supply the brain with blood. Allow one hour for this exam. There are no restrictions or special instructions.  Your physician has requested that you have an abdominal aorta duplex June 2018. During this test, an ultrasound is used to evaluate the aorta. Allow 30 minutes for this exam. Do not eat after midnight the day before and avoid carbonated beverages   Follow-Up: Your physician wants you to follow-up in: one year with Dr. Fletcher Anon.  You will receive a reminder letter in the mail two months in advance. If you don't receive a letter, please call our office to schedule the follow-up appointment.   Any Other Special Instructions Will Be Listed Below (If Applicable).     If you need a refill on your cardiac medications before your next appointment, please call your pharmacy.

## 2016-03-25 NOTE — Progress Notes (Signed)
Cardiology Office Note   Date:  03/25/2016   ID:  [name], DOB [birth year], MRN [id number]  PCP:  Owens Loffler, MD  Cardiologist:   [name], MD   Chief Complaint  Patient presents with  . other    6 month f/u no complaints. Meds reviewed verbally with pt.      History of Present Illness: [name] is a 74 y.o. male who presents for a followup visit regarding extensive peripheral arterial disease with previous stenting of the left subclavian artery and left axillary artery, bilateral common iliac artery stenting and  left renal artery stenting. The patient has multiple medical problems including hyperlipidemia, Small AAA, long smoking history for 60 years, COPD , carotid showed 40% to 59% bilateral disease, and atherosclerotic coronary artery disease status post CABG in March of 2013 with LIMA to the LAD, vein graft to the PDA, vein graft to the OM. He does have mild aortic valve stenosis.  He had a small intracranial hemorrhage in 2014 which was suspected to be due to malignant hypertension and dual antiplatelet therapy. Shortly after that he was diagnosed with extensive right-sided DVT. Anticoagulation was contraindicated and thus an IVC filter was placed.   He has been doing extremely well and denies any chest pain, shortness of breath or palpitations. No left armOr leg pain with walking. He is compliant with his medications. His blood pressure tends to be higher in the office. He checks his blood pressure 2-3 times per week and typically it times around 130/70.   Past Medical History:  Diagnosis Date  . Aortic calcification ([place]) 05/01/2013  . Arthritis    "hands; not dx'd" (12/26/2012)  . Bilateral renal artery stenosis ([place])    a. 06/2013 Angio/PTA: LRA: 95 (5x18 Herculink stent), RRA 60ost, celiac/SMA nl, RCIA patent stents extending into dist Ao, RIIA 70ost, REIA min irregs, LCIA patent stent, LIIA 60-70ost, LEIA min irregs.  . CHF (congestive heart failure)  ([place])   . COPD (chronic obstructive pulmonary disease) ([place])   . Coronary artery disease   . Hypertension   . Hypothyroidism   . Intraventricular hemorrhage ([place]) 07/12/2012  . Peripheral arterial disease ([place])    a. Previous left lower extremity stenting by Dr. Jamal Collin;  b. 12/2012 s/p bilat ostial common iliac stenting.  . Pneumonia   . Right leg DVT ([place]) 08/13/2012  . Subclavian artery stenosis, left ([place]) 05/2012   Status post stenting of the ostium and self-expanding stent placement to the left axillary artery  . Tobacco abuse    d/c 2012  . Venous insufficiency     Past Surgical History:  Procedure Laterality Date  . ABDOMINAL ANGIOGRAM  07/10/2013   WITH BI-FEMORAL RUNOFF       DR ARIDA  . ABDOMINAL AORTAGRAM N/A 12/26/2012   Procedure: ABDOMINAL Maxcine Ham;  Surgeon: Wellington Hampshire, MD;  Location: [facility] CATH LAB;  Service: Cardiovascular;  Laterality: N/A;  . ABDOMINAL AORTAGRAM N/A 07/10/2013   Procedure: ABDOMINAL Maxcine Ham;  Surgeon: Wellington Hampshire, MD;  Location: [facility] CATH LAB;  Service: Cardiovascular;  Laterality: N/A;  . ANGIOPLASTY / STENTING FEMORAL  05/2012  . ANGIOPLASTY / STENTING ILIAC Bilateral 12/26/2012  . ARCH AORTOGRAM N/A 06/20/2012   Procedure: ARCH AORTOGRAM;  Surgeon: Wellington Hampshire, MD;  Location: [facility] CATH LAB;  Service: Cardiovascular;  Laterality: N/A;  . CARDIAC CATHETERIZATION  2/14   MC  . CHOLECYSTECTOMY  1990's  . CORONARY ARTERY BYPASS GRAFT  07/11/2011  Procedure: CORONARY ARTERY BYPASS GRAFTING (CABG);  Surgeon: Grace Isaac, MD;  Location: [facility];  Service: Open Heart Surgery;  Laterality: N/A;  Times 3. On Pump. Using right greater saphenous vein and left internal mammary artery.   Marland Kitchen HERNIA REPAIR    . INSERTION OF ILIAC STENT Bilateral 12/26/2012   Procedure: INSERTION OF ILIAC STENT;  Surgeon: Wellington Hampshire, MD;  Location: [facility] CATH LAB;  Service: Cardiovascular;  Laterality: Bilateral;  Bilateral Common Iliac Artery  . LEFT AND RIGHT HEART  CATHETERIZATION WITH CORONARY ANGIOGRAM  07/07/2011   Procedure: LEFT AND RIGHT HEART CATHETERIZATION WITH CORONARY ANGIOGRAM;  Surgeon: Minna Merritts, MD;  Location: [facility] CATH LAB;  Service: Cardiovascular;;  . PERCUTANEOUS STENT INTERVENTION  06/20/2012   Procedure: PERCUTANEOUS STENT INTERVENTION;  Surgeon: Wellington Hampshire, MD;  Location: [facility] CATH LAB;  Service: Cardiovascular;;  . RENAL ARTERY ANGIOPLASTY Left 07/10/2013   DR ARIDA  . SUBCLAVIAN ARTERY STENT  05/2012   "2 stents" (12/26/2012)  . VENA CAVA FILTER PLACEMENT  07/2012   Removable     Current Outpatient Prescriptions  Medication Sig Dispense Refill  . amLODipine (NORVASC) 5 MG tablet TAKE ONE TABLET BY MOUTH EVERY DAY 30 tablet 3  . aspirin EC 81 MG tablet Start 1 tablet daily a week from 08/01/13 90 tablet 3  . atorvastatin (LIPITOR) 20 MG tablet TAKE ONE TABLET BY MOUTH EVERY DAY AT 6PM 30 tablet 6  . carvedilol (COREG) 6.25 MG tablet TAKE 1 TABLET TWICE DAILY WITH MEALS 60 tablet 6  . levothyroxine (SYNTHROID, LEVOTHROID) 175 MCG tablet TAKE ONE TABLET BY MOUTH EVERY DAY BEFORE BREAKFAST 30 tablet 8   No current facility-administered medications for this visit.     Allergies:   Plavix [clopidogrel bisulfate]    Social History:  The patient  reports that he quit smoking about 4 years ago. His smoking use included Cigarettes. He has a 50.00 pack-year smoking history. He has never used smokeless tobacco. He reports that he does not drink alcohol or use drugs.   Family History:  The patient's family history includes Alcohol abuse in his father; Cirrhosis in his father; Hypothyroidism in his sister.    ROS:  Please see the history of present illness.   Otherwise, review of systems are positive for none.   All other systems are reviewed and negative.    PHYSICAL EXAM: VS:  BP (!) 162/78 (BP Location: Left Arm, Patient Position: Sitting, Cuff Size: Normal)   Pulse 75   Ht 5\' 9"  (1.753 m)   Wt 169 lb 12 oz (77 kg)   BMI  25.07 kg/m  , BMI Body mass index is 25.07 kg/m. GEN: Well nourished, well developed, in no acute distress  HEENT: normal  Neck: no JVD or masses. There is left carotid bruit and a bruit in the left subclavian artery area. Cardiac: RRR; no , rubs, or gallops,no edema . 2/6 systolic ejection murmur in the aortic area Respiratory:  clear to auscultation bilaterally, normal work of breathing GI: soft, nontender, nondistended, + BS MS: no deformity or atrophy  Skin: warm and dry, no rash Neuro:  Strength and sensation are intact Psych: euthymic mood, full affect   EKG:  EKG is ordered today. The ekg ordered today demonstrates normal sinus rhythm with no significant ST or T wave changes.   Recent Labs: 08/18/2015: ALT 12; BUN 22; Creatinine, Ser 1.41; Hemoglobin 16.4; Platelets 198.0; Potassium 4.7; Sodium 140; TSH 0.35    Lipid Panel  Component Value Date/Time   CHOL 110 08/18/2015 0837   CHOL 125 01/31/2014 0756   TRIG 116.0 08/18/2015 0837   HDL 50.00 08/18/2015 0837   HDL 63 01/31/2014 0756   CHOLHDL 2 08/18/2015 0837   VLDL 23.2 08/18/2015 0837   LDLCALC 37 08/18/2015 0837   LDLCALC 46 01/31/2014 0756      Wt Readings from Last 3 Encounters:  03/25/16 169 lb 12 oz (77 kg)  08/10/15 174 lb 12.8 oz (79.3 kg)  08/03/15 177 lb 8 oz (80.5 kg)        ASSESSMENT AND PLAN:  1.  Coronary artery disease involving native coronary arteries without angina:  Continue medical therapy.  2. Bilateral carotid artery stenosis: Continue aggressive treatment of risk factors and repeat carotid Doppler in  June 2018  3. Bilateral renal artery stenosis: Status post left renal artery stenting.  He is known to have right renal artery stenosis which is being treated medically.  4. Abdominal aortic aneurysm: This is still small in size.  Repeat ultrasound in June 2018.  5. Essential hypertension: Blood pressure is elevated here but well controlled at home. Continue to monitor and  consider increasing the dose of carvedilol if needed.  6. Hyperlipidemia: Continue treatment with atorvastatin. Most recent LDL was 45.  7. Peripheral arterial disease with previous iliac stenting.  He has known SFA disease being treated medically. He has no significant claudication at the present time.  8. Status post left subclavian artery stenting. Left radial pulse is normal.     Disposition:   FU with me in 12 months  Signed,  [name], MD  03/25/2016 11:40 AM    [place]

## 2016-06-21 ENCOUNTER — Other Ambulatory Visit: Payer: Self-pay | Admitting: Cardiovascular Disease

## 2016-08-11 ENCOUNTER — Other Ambulatory Visit: Payer: Self-pay | Admitting: Family Medicine

## 2016-08-16 ENCOUNTER — Other Ambulatory Visit: Payer: Self-pay | Admitting: Cardiovascular Disease

## 2016-09-08 ENCOUNTER — Ambulatory Visit (INDEPENDENT_AMBULATORY_CARE_PROVIDER_SITE_OTHER): Payer: Medicare Other

## 2016-09-08 VITALS — BP 138/70 | HR 75 | Temp 97.7°F | Ht 68.5 in | Wt 171.0 lb

## 2016-09-08 DIAGNOSIS — Z0000 Encounter for general adult medical examination without abnormal findings: Secondary | ICD-10-CM

## 2016-09-08 DIAGNOSIS — I10 Essential (primary) hypertension: Secondary | ICD-10-CM | POA: Diagnosis not present

## 2016-09-08 DIAGNOSIS — E7800 Pure hypercholesterolemia, unspecified: Secondary | ICD-10-CM | POA: Diagnosis not present

## 2016-09-08 DIAGNOSIS — E038 Other specified hypothyroidism: Secondary | ICD-10-CM

## 2016-09-08 LAB — CBC WITH DIFFERENTIAL/PLATELET
Basophils Absolute: 0.1 10*3/uL (ref 0.0–0.1)
Basophils Relative: 1.1 % (ref 0.0–3.0)
Eosinophils Absolute: 0.2 10*3/uL (ref 0.0–0.7)
Eosinophils Relative: 3.7 % (ref 0.0–5.0)
HEMATOCRIT: 48.6 % (ref 39.0–52.0)
HEMOGLOBIN: 16 g/dL (ref 13.0–17.0)
Lymphocytes Relative: 35.1 % (ref 12.0–46.0)
Lymphs Abs: 2.1 10*3/uL (ref 0.7–4.0)
MCHC: 32.9 g/dL (ref 30.0–36.0)
MCV: 90.1 fl (ref 78.0–100.0)
Monocytes Absolute: 0.6 10*3/uL (ref 0.1–1.0)
Monocytes Relative: 9.6 % (ref 3.0–12.0)
Neutro Abs: 3 10*3/uL (ref 1.4–7.7)
Neutrophils Relative %: 50.5 % (ref 43.0–77.0)
Platelets: 219 10*3/uL (ref 150.0–400.0)
RBC: 5.4 Mil/uL (ref 4.22–5.81)
RDW: 14 % (ref 11.5–15.5)
WBC: 6 10*3/uL (ref 4.0–10.5)

## 2016-09-08 LAB — LIPID PANEL
Cholesterol: 110 mg/dL (ref 0–200)
HDL: 52.8 mg/dL (ref >39.00)
LDL CALC: 40 mg/dL (ref 0–99)
NONHDL: 57.55
Total CHOL/HDL Ratio: 2
Triglycerides: 88 mg/dL (ref 0.0–149.0)
VLDL: 17.6 mg/dL (ref 0.0–40.0)

## 2016-09-08 LAB — BASIC METABOLIC PANEL
BUN: 19 mg/dL (ref 6–23)
CO2: 28 mEq/L (ref 19–32)
CREATININE: 1.21 mg/dL (ref 0.40–1.50)
Calcium: 9.4 mg/dL (ref 8.4–10.5)
Chloride: 105 mEq/L (ref 96–112)
GFR: 62.09 mL/min (ref >60.00)
Glucose, Bld: 74 mg/dL (ref 70–99)
POTASSIUM: 4.4 meq/L (ref 3.5–5.1)
Sodium: 141 mEq/L (ref 135–145)

## 2016-09-08 LAB — HEPATIC FUNCTION PANEL
ALBUMIN: 4.3 g/dL (ref 3.5–5.2)
ALK PHOS: 95 U/L (ref 39–117)
ALT: 7 U/L (ref 0–53)
AST: 10 U/L (ref 0–37)
Bilirubin, Direct: 0.2 mg/dL (ref 0.0–0.3)
TOTAL PROTEIN: 6.4 g/dL (ref 6.0–8.3)
Total Bilirubin: 0.6 mg/dL (ref 0.2–1.2)

## 2016-09-08 LAB — T3, FREE: T3, Free: 3 pg/mL (ref 2.3–4.2)

## 2016-09-08 LAB — TSH: TSH: 0.03 u[IU]/mL — AB (ref 0.35–4.50)

## 2016-09-08 LAB — T4, FREE: Free T4: 1.92 ng/dL — ABNORMAL HIGH (ref 0.60–1.60)

## 2016-09-08 NOTE — Progress Notes (Signed)
PCP notes:   Health maintenance:  Tetanus - postponed/insurance  Abnormal screenings:   Hearing - failed  Patient concerns:   None  Nurse concerns:  None  Next PCP appt:   09/21/16 @ 0900

## 2016-09-08 NOTE — Progress Notes (Signed)
Subjective:   [name] is a 76 y.o. male who presents for Medicare Annual/Subsequent preventive examination.  Review of Systems:  N/A Cardiac Risk Factors include: advanced age (>60men, >57 women);male gender;hypertension;dyslipidemia     Objective:    Vitals: BP 138/70 (BP Location: Left Arm, Patient Position: Sitting, Cuff Size: Normal)   Pulse 75   Temp 97.7 F (36.5 C) (Oral)   Ht 5' 8.5" (1.74 m) Comment: no shoes  Wt 171 lb (77.6 kg)   SpO2 96%   BMI 25.62 kg/m   Body mass index is 25.62 kg/m.  Tobacco History  Smoking Status  . Former Smoker  . Packs/day: 1.00  . Years: 50.00  . Types: Cigarettes  . Quit date: 12/25/2010  Smokeless Tobacco  . Never Used     Counseling given: No   Past Medical History:  Diagnosis Date  . Aortic calcification ([place]) 05/01/2013  . Arthritis    "hands; not dx'd" (12/26/2012)  . Bilateral renal artery stenosis ([place])    a. 06/2013 Angio/PTA: LRA: 95 (5x18 Herculink stent), RRA 60ost, celiac/SMA nl, RCIA patent stents extending into dist Ao, RIIA 70ost, REIA min irregs, LCIA patent stent, LIIA 60-70ost, LEIA min irregs.  . CHF (congestive heart failure) ([place])   . COPD (chronic obstructive pulmonary disease) ([place])   . Coronary artery disease   . Hypertension   . Hypothyroidism   . Intraventricular hemorrhage ([place]) 07/12/2012  . Peripheral arterial disease ([place])    a. Previous left lower extremity stenting by Dr. Jamal Collin;  b. 12/2012 s/p bilat ostial common iliac stenting.  . Pneumonia   . Right leg DVT ([place]) 08/13/2012  . Subclavian artery stenosis, left ([place]) 05/2012   Status post stenting of the ostium and self-expanding stent placement to the left axillary artery  . Tobacco abuse    d/c 2012  . Venous insufficiency    Past Surgical History:  Procedure Laterality Date  . ABDOMINAL ANGIOGRAM  07/10/2013   WITH BI-FEMORAL RUNOFF       DR ARIDA  . ABDOMINAL AORTAGRAM N/A 12/26/2012   Procedure: ABDOMINAL Maxcine Ham;  Surgeon:  Wellington Hampshire, MD;  Location: [facility];  Service: Cardiovascular;  Laterality: N/A;  . ABDOMINAL AORTAGRAM N/A 07/10/2013   Procedure: ABDOMINAL Maxcine Ham;  Surgeon: Wellington Hampshire, MD;  Location: [facility];  Service: Cardiovascular;  Laterality: N/A;  . ANGIOPLASTY / STENTING FEMORAL  05/2012  . ANGIOPLASTY / STENTING ILIAC Bilateral 12/26/2012  . ARCH AORTOGRAM N/A 06/20/2012   Procedure: ARCH AORTOGRAM;  Surgeon: Wellington Hampshire, MD;  Location: [facility];  Service: Cardiovascular;  Laterality: N/A;  . CARDIAC CATHETERIZATION  2/14   [facility]  . CHOLECYSTECTOMY  1990's  . CORONARY ARTERY BYPASS GRAFT  07/11/2011   Procedure: CORONARY ARTERY BYPASS GRAFTING (CABG);  Surgeon: Grace Isaac, MD;  Location: [facility];  Service: Open Heart Surgery;  Laterality: N/A;  Times 3. On Pump. Using right greater saphenous vein and left internal mammary artery.   Marland Kitchen HERNIA REPAIR    . INSERTION OF ILIAC STENT Bilateral 12/26/2012   Procedure: INSERTION OF ILIAC STENT;  Surgeon: Wellington Hampshire, MD;  Location: [facility];  Service: Cardiovascular;  Laterality: Bilateral;  Bilateral Common Iliac Artery  . LEFT AND RIGHT HEART CATHETERIZATION WITH CORONARY ANGIOGRAM  07/07/2011   Procedure: LEFT AND RIGHT HEART CATHETERIZATION WITH CORONARY ANGIOGRAM;  Surgeon: Minna Merritts, MD;  Location: [facility];  Service: Cardiovascular;;  . PERCUTANEOUS STENT INTERVENTION  06/20/2012   Procedure: PERCUTANEOUS STENT INTERVENTION;  Surgeon: Wellington Hampshire, MD;  Location: [facility];  Service: Cardiovascular;;  . RENAL ARTERY ANGIOPLASTY Left 07/10/2013   DR ARIDA  . SUBCLAVIAN ARTERY STENT  05/2012   "2 stents" (12/26/2012)  . TOOTH EXTRACTION  Spring 2017   mulitple (6)  . VENA CAVA FILTER PLACEMENT  07/2012   Removable   Family History  Problem Relation Age of Onset  . Alcohol abuse Father   . Cirrhosis Father   . Hypothyroidism Sister    History  Sexual Activity  . Sexual activity: Not Currently     Outpatient Encounter Prescriptions as of 09/08/2016  Medication Sig  . amLODipine (NORVASC) 5 MG tablet TAKE ONE TABLET BY MOUTH EVERY DAY.  Marland Kitchen aspirin EC 81 MG tablet Start 1 tablet daily a week from 08/01/13  . atorvastatin (LIPITOR) 20 MG tablet TAKE 1 TABLET BY MOUTH DAILY AT 6PM  . carvedilol (COREG) 6.25 MG tablet TAKE 1 TABLET TWICE DAILY WITH MEALS  . levothyroxine (SYNTHROID, LEVOTHROID) 175 MCG tablet TAKE ONE TABLET BY MOUTH EVERY DAY BEFORE BREAKFAST   No facility-administered encounter medications on file as of 09/08/2016.     Activities of Daily Living In your present state of health, do you have any difficulty performing the following activities: 09/08/2016  Hearing? N  Vision? N  Difficulty concentrating or making decisions? N  Walking or climbing stairs? Y  Dressing or bathing? N  Doing errands, shopping? N  Preparing Food and eating ? N  Using the Toilet? N  In the past six months, have you accidently leaked urine? N  Do you have problems with loss of bowel control? N  Managing your Medications? N  Managing your Finances? N  Housekeeping or managing your Housekeeping? N  Some recent data might be hidden    Patient Care Team: Owens Loffler, MD as PCP - General (Family Medicine) Minna Merritts, MD as Consulting Physician (Cardiology) Owens Loffler, MD (Family Medicine)   Assessment:     Hearing Screening   125Hz  250Hz  500Hz  1000Hz  2000Hz  3000Hz  4000Hz  6000Hz  8000Hz   Right ear:   40 40 40  0    Left ear:   40 40 40  0      Visual Acuity Screening   Right eye Left eye Both eyes  Without correction:     With correction: 20/20 20/20 20/20     Exercise Activities and Dietary recommendations Current Exercise Habits: Home exercise routine, Type of exercise: walking, Time (Minutes): 30, Frequency (Times/Week): 7, Weekly Exercise (Minutes/Week): 210, Intensity: Moderate, Exercise limited by: None identified  Goals    . Increase physical activity           Starting 09/08/16, I will continue to walk at least 30 min daily.       Fall Risk Fall Risk  09/08/2016 12/29/2015 08/06/2014  Falls in the past year? No No No   Depression Screen PHQ 2/9 Scores 09/08/2016 08/06/2014  PHQ - 2 Score 0 0    Cognitive Function MMSE - Mini Mental State Exam 09/08/2016  Orientation to time 5  Orientation to Place 5  Registration 3  Attention/ Calculation 0  Recall 3  Language- name 2 objects 0  Language- repeat 1  Language- follow 3 step command 3  Language- read & follow direction 0  Write a sentence 0  Copy design 0  Total score 20     PLEASE NOTE: A Mini-Cog screen was completed. Maximum score  is 20. A value of 0 denotes this part of [place] MMSE was not completed or the patient failed this part of the Mini-Cog screening.   Mini-Cog Screening Orientation to Time - Max 5 pts Orientation to Place - Max 5 pts Registration - Max 3 pts Recall - Max 3 pts Language Repeat - Max 1 pts Language Follow 3 Step Command - Max 3 pts     Immunization History  Administered Date(s) Administered  . Pneumococcal Conjugate-13 02/20/2014  . Pneumococcal Polysaccharide-23 04/11/2008  . Td 09/07/2006   Screening Tests Health Maintenance  Topic Date Due  . TETANUS/TDAP  09/06/2026 (Originally 09/06/2016)  . INFLUENZA VACCINE  11/23/2016  . COLONOSCOPY  01/12/2021  . PNA vac Low Risk Adult  Completed      Plan:     I have personally reviewed and addressed the Medicare Annual Wellness questionnaire and have noted the following in the patient's chart:  A. Medical and social history B. Use of alcohol, tobacco or illicit drugs  C. Current medications and supplements D. Functional ability and status E.  Nutritional status F.  Physical activity G. Advance directives H. List of other physicians I.  Hospitalizations, surgeries, and ER visits in previous 12 months J.  [place] to include hearing, vision, cognitive, depression L. Referrals and  appointments - none  In addition, I have reviewed and discussed with patient certain preventive protocols, quality metrics, and best practice recommendations. A written personalized care plan for preventive services as well as general preventive health recommendations were provided to patient.  See attached scanned questionnaire for additional information.   Signed,   Lindell Noe, MHA, BS, LPN Health Coach

## 2016-09-08 NOTE — Patient Instructions (Signed)
[name] , Thank you for taking time to come for your Medicare Wellness Visit. I appreciate your ongoing commitment to your health goals. Please review the following plan we discussed and let me know if I can assist you in the future.   These are the goals we discussed: Goals    . Increase physical activity          Starting 09/08/16, I will continue to walk at least 30 min daily.        This is a list of the screening recommended for you and due dates:  Health Maintenance  Topic Date Due  . Tetanus Vaccine  09/06/2026*  . Flu Shot  11/23/2016  . Colon Cancer Screening  01/12/2021  . Pneumonia vaccines  Completed  *Topic was postponed. The date shown is not the original due date.   Preventive Care for Adults  A healthy lifestyle and preventive care can promote health and wellness. Preventive health guidelines for adults include the following key practices.  . A routine yearly physical is a good way to check with your health care provider about your health and preventive screening. It is a chance to share any concerns and updates on your health and to receive a thorough exam.  . Visit your dentist for a routine exam and preventive care every 6 months. Brush your teeth twice a day and floss once a day. Good oral hygiene prevents tooth decay and gum disease.  . The frequency of eye exams is based on your age, health, family medical history, use  of contact lenses, and other factors. Follow your health care provider's ecommendations for frequency of eye exams.  . Eat a healthy diet. Foods like vegetables, fruits, whole grains, low-fat dairy products, and lean protein foods contain the nutrients you need without too many calories. Decrease your intake of foods high in solid fats, added sugars, and salt. Eat the right amount of calories for you. Get information about a proper diet from your health care provider, if necessary.  . Regular physical exercise is one of the most important  things you can do for your health. Most adults should get at least 150 minutes of moderate-intensity exercise (any activity that increases your heart rate and causes you to sweat) each week. In addition, most adults need muscle-strengthening exercises on 2 or more days a week.  Silver Sneakers may be a benefit available to you. To determine eligibility, you may visit the website: www.silversneakers.com or contact program at 2013072778 Mon-Fri between 8AM-8PM.   . Maintain a healthy weight. The body mass index (BMI) is a screening tool to identify possible weight problems. It provides an estimate of body fat based on height and weight. Your health care provider can find your BMI and can help you achieve or maintain a healthy weight.   For adults 20 years and older: ? A BMI below 18.5 is considered underweight. ? A BMI of 18.5 to 24.9 is normal. ? A BMI of 25 to 29.9 is considered overweight. ? A BMI of 30 and above is considered obese.   . Maintain normal blood lipids and cholesterol levels by exercising and minimizing your intake of saturated fat. Eat a balanced diet with plenty of fruit and vegetables. Blood tests for lipids and cholesterol should begin at age 22 and be repeated every 5 years. If your lipid or cholesterol levels are high, you are over 50, or you are at high risk for heart disease, you may need your cholesterol  levels checked more frequently. Ongoing high lipid and cholesterol levels should be treated with medicines if diet and exercise are not working.  . If you smoke, find out from your health care provider how to quit. If you do not use tobacco, please do not start.  . If you choose to drink alcohol, please do not consume more than 2 drinks per day. One drink is considered to be 12 ounces (355 mL) of beer, 5 ounces (148 mL) of wine, or 1.5 ounces (44 mL) of liquor.  . If you are 42-75 years old, ask your health care provider if you should take aspirin to prevent  strokes.  . Use sunscreen. Apply sunscreen liberally and repeatedly throughout the day. You should seek shade when your shadow is shorter than you. Protect yourself by wearing long sleeves, pants, a wide-brimmed hat, and sunglasses year round, whenever you are outdoors.  . Once a month, do a whole body skin exam, using a mirror to look at the skin on your back. Tell your health care provider of new moles, moles that have irregular borders, moles that are larger than a pencil eraser, or moles that have changed in shape or color.

## 2016-09-08 NOTE — Progress Notes (Signed)
Pre visit review using our clinic review tool, if applicable. No additional management support is needed unless otherwise documented below in the visit note. 

## 2016-09-08 NOTE — Progress Notes (Signed)
I reviewed health advisor's note, was available for consultation, and agree with documentation and plan.  

## 2016-09-16 ENCOUNTER — Other Ambulatory Visit: Payer: Self-pay | Admitting: Family Medicine

## 2016-09-21 ENCOUNTER — Ambulatory Visit: Payer: PRIVATE HEALTH INSURANCE | Admitting: Family Medicine

## 2016-09-29 ENCOUNTER — Ambulatory Visit (INDEPENDENT_AMBULATORY_CARE_PROVIDER_SITE_OTHER): Payer: Medicare Other | Admitting: Family Medicine

## 2016-09-29 ENCOUNTER — Encounter: Payer: Self-pay | Admitting: Family Medicine

## 2016-09-29 VITALS — BP 122/62 | HR 77 | Temp 98.4°F | Ht 68.5 in | Wt 168.8 lb

## 2016-09-29 DIAGNOSIS — E038 Other specified hypothyroidism: Secondary | ICD-10-CM | POA: Diagnosis not present

## 2016-09-29 DIAGNOSIS — N529 Male erectile dysfunction, unspecified: Secondary | ICD-10-CM | POA: Diagnosis not present

## 2016-09-29 DIAGNOSIS — N401 Enlarged prostate with lower urinary tract symptoms: Secondary | ICD-10-CM | POA: Diagnosis not present

## 2016-09-29 DIAGNOSIS — Z0001 Encounter for general adult medical examination with abnormal findings: Secondary | ICD-10-CM

## 2016-09-29 MED ORDER — LEVOTHYROXINE SODIUM 150 MCG PO TABS: ORAL_TABLET | ORAL | 11 refills | Status: DC

## 2016-09-29 MED ORDER — SILDENAFIL CITRATE 20 MG PO TABS: ORAL_TABLET | ORAL | 5 refills | Status: DC

## 2016-09-29 NOTE — Progress Notes (Signed)
Dr. Frederico Hamman T. Boysie Bonebrake, MD, [facility] [place], [address] Phone: [phone] Fax: [phone]  09/29/2016  Patient: [name], MRN: [id number], DOB: 01/28/1941, 76 y.o.  Primary Physician:  Owens Loffler, MD   Chief Complaint  Patient presents with  . Annual Exam    Part 2   Subjective:   [name] is a 76 y.o. pleasant patient who presents with the following:  Preventative Health Maintenance Visit:  Health Maintenance Summary Reviewed and updated, unless pt declines services.  Globally doing fairly well with the exception of the listed issues below and red.  Tobacco History Reviewed. Alcohol: No concerns, no excessive use Exercise Habits: Some activity, rec at least 30 mins 5 times a week STD concerns: no risk or activity to increase risk Drug Use: None Encouraged self-testicular check  Health Maintenance  Topic Date Due  . TETANUS/TDAP  09/06/2026 (Originally 09/06/2016)  . INFLUENZA VACCINE  11/23/2016  . COLONOSCOPY  01/12/2021  . PNA vac Low Risk Adult  Completed   Immunization History  Administered Date(s) Administered  . Pneumococcal Conjugate-13 02/20/2014  . Pneumococcal Polysaccharide-23 04/11/2008  . Td 09/07/2006   Patient Active Problem List   Diagnosis Date Noted  . CKD (chronic kidney disease) stage 3, GFR 30-59 ml/min 05/01/2013    Priority: High  . Intraventricular hemorrhage ([place]) 07/12/2012    Priority: High  . Ischemic cardiomyopathy 07/08/2011    Priority: High  . CAD (coronary artery disease), native coronary artery 07/07/2011    Priority: High  . COPD (chronic obstructive pulmonary disease) ([place]) 01/08/2010    Priority: High  . Bilateral renal artery stenosis (HCC)     Priority: Medium  . HTN (hypertension) 07/15/2012    Priority: Medium  . Peripheral arterial disease ([place])     Priority: Medium  . Hypothyroidism 09/07/2006    Priority: Medium  . RAS  (renal artery stenosis) ([place]) 07/10/2013  . Acute respiratory failure ([place]) 05/24/2013  . Paroxysmal a-fib: briefly in ED now in NSR 05/24/2013  . SIRS (systemic inflammatory response syndrome) ([place]) 05/24/2013  . Aortic calcification ([place]) 05/01/2013  . Right leg DVT ([place]) 08/13/2012  . Subclavian arterial stenosis ([place]) 04/25/2011  . BPH (benign prostatic hyperplasia) 12/29/2010  . Abdominal aortic aneurysm ([place]) 11/06/2009  . HYPERCHOLESTEROLEMIA 10/09/2009  . TOBACCO ABUSE 04/11/2008  . PROSTATE SPECIFIC ANTIGEN, ELEVATED 04/11/2008   Past Medical History:  Diagnosis Date  . Aortic calcification ([place]) 05/01/2013  . Arthritis    "hands; not dx'd" (12/26/2012)  . Bilateral renal artery stenosis ([place])    a. 06/2013 Angio/PTA: LRA: 95 (5x18 Herculink stent), RRA 60ost, celiac/SMA nl, RCIA patent stents extending into dist Ao, RIIA 70ost, REIA min irregs, LCIA patent stent, LIIA 60-70ost, LEIA min irregs.  . CHF (congestive heart failure) ([place])   . COPD (chronic obstructive pulmonary disease) ([place])   . Coronary artery disease   . Hypertension   . Hypothyroidism   . Intraventricular hemorrhage ([place]) 07/12/2012  . Peripheral arterial disease ([place])    a. Previous left lower extremity stenting by Dr. Jamal Collin;  b. 12/2012 s/p bilat ostial common iliac stenting.  . Pneumonia   . Right leg DVT ([place]) 08/13/2012  . Subclavian artery stenosis, left ([place]) 05/2012   Status post stenting of the ostium and self-expanding stent placement to the left axillary artery  . Tobacco abuse    d/c 2012  . Venous insufficiency    Past Surgical History:  Procedure Laterality Date  . ABDOMINAL ANGIOGRAM  07/10/2013   WITH BI-FEMORAL RUNOFF       DR ARIDA  . ABDOMINAL AORTAGRAM N/A 12/26/2012   Procedure: ABDOMINAL Maxcine Ham;  Surgeon: Wellington Hampshire, MD;  Location: [facility];  Service: Cardiovascular;  Laterality: N/A;  . ABDOMINAL AORTAGRAM N/A 07/10/2013   Procedure: ABDOMINAL Maxcine Ham;  Surgeon: Wellington Hampshire, MD;  Location: [facility];  Service: Cardiovascular;  Laterality: N/A;  . ANGIOPLASTY / STENTING FEMORAL  05/2012  . ANGIOPLASTY / STENTING ILIAC Bilateral 12/26/2012  . ARCH AORTOGRAM N/A 06/20/2012   Procedure: ARCH AORTOGRAM;  Surgeon: Wellington Hampshire, MD;  Location: [facility];  Service: Cardiovascular;  Laterality: N/A;  . CARDIAC CATHETERIZATION  2/14   [facility]  . CHOLECYSTECTOMY  1990's  . CORONARY ARTERY BYPASS GRAFT  07/11/2011   Procedure: CORONARY ARTERY BYPASS GRAFTING (CABG);  Surgeon: Grace Isaac, MD;  Location: [facility];  Service: Open Heart Surgery;  Laterality: N/A;  Times 3. On Pump. Using right greater saphenous vein and left internal mammary artery.   Marland Kitchen HERNIA REPAIR    . INSERTION OF ILIAC STENT Bilateral 12/26/2012   Procedure: INSERTION OF ILIAC STENT;  Surgeon: Wellington Hampshire, MD;  Location: [facility];  Service: Cardiovascular;  Laterality: Bilateral;  Bilateral Common Iliac Artery  . LEFT AND RIGHT HEART CATHETERIZATION WITH CORONARY ANGIOGRAM  07/07/2011   Procedure: LEFT AND RIGHT HEART CATHETERIZATION WITH CORONARY ANGIOGRAM;  Surgeon: Minna Merritts, MD;  Location: [facility];  Service: Cardiovascular;;  . PERCUTANEOUS STENT INTERVENTION  06/20/2012   Procedure: PERCUTANEOUS STENT INTERVENTION;  Surgeon: Wellington Hampshire, MD;  Location: [facility];  Service: Cardiovascular;;  . RENAL ARTERY ANGIOPLASTY Left 07/10/2013   DR ARIDA  . SUBCLAVIAN ARTERY STENT  05/2012   "2 stents" (12/26/2012)  . TOOTH EXTRACTION  Spring 2017   mulitple (6)  . VENA CAVA FILTER PLACEMENT  07/2012   Removable   Social History   Social History  . Marital status: Married    Spouse name: N/A  . Number of children: 2  . Years of education: N/A   Occupational History  . Retired Retired    Land   Social History Main Topics  . Smoking status: Former Smoker    Packs/day: 1.00    Years: 50.00    Types: Cigarettes    Quit date: 12/25/2010  . Smokeless tobacco:  Never Used  . Alcohol use No  . Drug use: No  . Sexual activity: Not Currently   Other Topics Concern  . Not on file   Social History Narrative   Exercsiing 3 times a week.    Moderate diet control.    O living will, no HCPOA.   Family History  Problem Relation Age of Onset  . Alcohol abuse Father   . Cirrhosis Father   . Hypothyroidism Sister    Allergies  Allergen Reactions  . Plavix [Clopidogrel Bisulfate]     Brain hemorrhage prev while on plavix and aspirin    Medication list has been reviewed and updated.   General: Denies fever, chills, sweats. No significant weight loss. Eyes: Denies blurring,significant itching ENT: Denies earache, sore throat, and hoarseness. Cardiovascular: Denies chest pains, palpitations, dyspnea on exertion Respiratory: Denies cough, dyspnea at rest,wheeezing Breast: no concerns about lumps GI: Denies nausea, vomiting, diarrhea, constipation, change in bowel habits, abdominal pain, melena, hematochezia GU: Increased need for urination, he is getting up 3 times,  sometimes 2 times in the middle the night. Incomplete emptying. At this point is not ready to consider medications.   Intermittent ED  Musculoskeletal: Denies back pain, joint pain Derm: Denies rash, itching Neuro: Denies  paresthesias, frequent falls, frequent headaches Psych: Denies depression, anxiety Endocrine: Denies cold intolerance, heat intolerance, polydipsia Heme: Denies enlarged lymph nodes Allergy: No hayfever  Objective:   BP 122/62   Pulse 77   Temp 98.4 F (36.9 C) (Oral)   Ht 5' 8.5" (1.74 m)   Wt 168 lb 12 oz (76.5 kg)   BMI 25.29 kg/m  Ideal Body Weight: Weight in (lb) to have BMI = 25: 166.5  No exam data present  GEN: well developed, well nourished, no acute distress Eyes: conjunctiva and lids normal, PERRLA, EOMI ENT: TM clear, nares clear, oral exam WNL Neck: supple, no lymphadenopathy, no thyromegaly, no JVD Pulm: clear to auscultation and  percussion, respiratory effort normal CV: regular rate and rhythm, S1-S2, no murmur, rub or gallop, no bruits, peripheral pulses normal and symmetric, no cyanosis, clubbing, edema or varicosities GI: soft, non-tender; no hepatosplenomegaly, masses; active bowel sounds all quadrants GU: Deferred Lymph: no cervical, axillary or inguinal adenopathy MSK: gait normal, muscle tone and strength WNL, no joint swelling, effusions, discoloration, crepitus  SKIN: clear, good turgor, color WNL, no rashes, lesions, or ulcerations Neuro: normal mental status, normal strength, sensation, and motion Psych: alert; oriented to person, place and time, normally interactive and not anxious or depressed in appearance.   All labs reviewed with patient.  Lipids:    Component Value Date/Time   CHOL 110 09/08/2016 0922   CHOL 125 01/31/2014 0756   TRIG 88.0 09/08/2016 0922   HDL 52.80 09/08/2016 0922   HDL 63 01/31/2014 0756   VLDL 17.6 09/08/2016 0922   CHOLHDL 2 09/08/2016 0922   CBC: CBC Latest Ref Rng & Units 09/08/2016 08/18/2015 08/01/2014  WBC 4.0 - 10.5 K/uL 6.0 6.5 6.5  Hemoglobin 13.0 - 17.0 g/dL 16.0 16.4 16.2  Hematocrit 39.0 - 52.0 % 48.6 48.9 47.7  Platelets 150.0 - 400.0 K/uL 219.0 198.0 338.2    Basic Metabolic Panel:    Component Value Date/Time   NA 141 09/08/2016 0922   NA 140 07/28/2015 1006   NA 136 06/03/2013 0454   K 4.4 09/08/2016 0922   K 4.1 06/03/2013 0454   CL 105 09/08/2016 0922   CL 106 06/03/2013 0454   CO2 28 09/08/2016 0922   CO2 27 06/03/2013 0454   BUN 19 09/08/2016 0922   BUN 18 07/28/2015 1006   BUN 22 (H) 06/03/2013 0454   CREATININE 1.21 09/08/2016 0922   CREATININE 1.59 (H) 06/03/2013 0454   CREATININE 1.02 04/22/2011 1507   GLUCOSE 74 09/08/2016 0922   GLUCOSE 80 06/03/2013 0454   CALCIUM 9.4 09/08/2016 0922   CALCIUM 8.8 06/03/2013 0454   Hepatic Function Latest Ref Rng & Units 09/08/2016 08/18/2015 08/01/2014  Total Protein 6.0 - 8.3 g/dL 6.4 6.6 6.4    Albumin 3.5 - 5.2 g/dL 4.3 4.0 4.0  AST 0 - 37 U/L 10 14 12   ALT 0 - 53 U/L 7 12 8   Alk Phosphatase 39 - 117 U/L 95 72 99  Total Bilirubin 0.2 - 1.2 mg/dL 0.6 0.6 0.7  Bilirubin, Direct 0.0 - 0.3 mg/dL 0.2 0.1 0.2    Lab Results  Component Value Date   TSH 0.03 (L) 09/08/2016   Lab Results  Component Value Date   PSA 3.32 08/01/2014  PSA 2.58 11/21/2011   PSA 2.79 09/29/2009    Assessment and Plan:   Encounter for general adult medical examination with abnormal findings  Other specified hypothyroidism - Plan: T3, free, T4, free, TSH  Benign prostatic hyperplasia with lower urinary tract symptoms, symptom details unspecified - Plan: PSA   Recheck thyroid. Check PSA at that time.  Health Maintenance Exam: The patient's preventative maintenance and recommended screening tests for an annual wellness exam were reviewed in full today. Brought up to date unless services declined.  Counselled on the importance of diet, exercise, and its role in overall health and mortality. The patient's FH and SH was reviewed, including their home life, tobacco status, and drug and alcohol status.  Follow-up in 1 year for physical exam or additional follow-up below.  Follow-up: Return in about 2 months (around 11/29/2016) for labs. Or follow-up in 1 year if not noted.  Meds ordered this encounter  Medications  . levothyroxine (SYNTHROID, LEVOTHROID) 150 MCG tablet    Sig: TAKE ONE TABLET BY MOUTH EVERY DAY BEFORE BREAKFAST    Dispense:  30 tablet    Refill:  11  . sildenafil (REVATIO) 20 MG tablet    Sig: Generic Revatio / Sildanefil 20 mg. 2 - 5 tabs 30 mins prior to intercourse.    Dispense:  20 tablet    Refill:  5   Medications Discontinued During This Encounter  Medication Reason  . levothyroxine (SYNTHROID, LEVOTHROID) 175 MCG tablet Reorder   Orders Placed This Encounter  Procedures  . T3, free  . T4, free  . TSH  . PSA    Signed,  Frederico Hamman T. Kla Bily,  MD   Allergies as of 09/29/2016      Reactions   Plavix [clopidogrel Bisulfate]    Brain hemorrhage prev while on plavix and aspirin      Medication List       Accurate as of 09/29/16  1:44 PM. Always use your most recent med list.          amLODipine 5 MG tablet Commonly known as:  NORVASC TAKE ONE TABLET BY MOUTH EVERY DAY.   aspirin EC 81 MG tablet Start 1 tablet daily a week from 08/01/13   atorvastatin 20 MG tablet Commonly known as:  LIPITOR TAKE 1 TABLET BY MOUTH DAILY AT 6PM   carvedilol 6.25 MG tablet Commonly known as:  COREG TAKE 1 TABLET TWICE DAILY WITH MEALS   levothyroxine 150 MCG tablet Commonly known as:  SYNTHROID, LEVOTHROID TAKE ONE TABLET BY MOUTH EVERY DAY BEFORE BREAKFAST   sildenafil 20 MG tablet Commonly known as:  REVATIO Generic Revatio / Sildanefil 20 mg. 2 - 5 tabs 30 mins prior to intercourse.

## 2016-11-22 ENCOUNTER — Other Ambulatory Visit: Payer: Self-pay | Admitting: Cardiovascular Disease

## 2016-11-22 DIAGNOSIS — I779 Disorder of arteries and arterioles, unspecified: Secondary | ICD-10-CM

## 2016-11-22 DIAGNOSIS — I6523 Occlusion and stenosis of bilateral carotid arteries: Secondary | ICD-10-CM

## 2016-11-23 ENCOUNTER — Other Ambulatory Visit: Payer: Self-pay | Admitting: Family Medicine

## 2016-11-30 ENCOUNTER — Ambulatory Visit: Payer: Medicare Other

## 2016-11-30 DIAGNOSIS — I779 Disorder of arteries and arterioles, unspecified: Secondary | ICD-10-CM

## 2016-11-30 DIAGNOSIS — I6523 Occlusion and stenosis of bilateral carotid arteries: Secondary | ICD-10-CM | POA: Diagnosis not present

## 2016-11-30 LAB — VAS US CAROTID
LEFT ECA DIAS: -9 cm/s
LEFT VERTEBRAL DIAS: -42 cm/s
Left CCA dist dias: -17 cm/s
Left CCA dist sys: -82 cm/s
Left CCA prox dias: 15 cm/s
Left CCA prox sys: 90 cm/s
Left ICA dist dias: -32 cm/s
Left ICA dist sys: -93 cm/s
Left ICA prox dias: 52 cm/s
Left ICA prox sys: 179 cm/s
RIGHT ECA DIAS: -16 cm/s
RIGHT VERTEBRAL DIAS: 8 cm/s
Right CCA prox dias: 13 cm/s
Right CCA prox sys: 126 cm/s
Right cca dist sys: -87 cm/s

## 2016-12-01 ENCOUNTER — Other Ambulatory Visit (INDEPENDENT_AMBULATORY_CARE_PROVIDER_SITE_OTHER): Payer: Medicare Other

## 2016-12-01 DIAGNOSIS — N401 Enlarged prostate with lower urinary tract symptoms: Secondary | ICD-10-CM | POA: Diagnosis not present

## 2016-12-01 DIAGNOSIS — E038 Other specified hypothyroidism: Secondary | ICD-10-CM

## 2016-12-01 LAB — PSA: PSA: 5.9 ng/mL — ABNORMAL HIGH (ref 0.10–4.00)

## 2016-12-01 LAB — T4, FREE: FREE T4: 1.89 ng/dL — AB (ref 0.60–1.60)

## 2016-12-01 LAB — TSH: TSH: 0.21 u[IU]/mL — ABNORMAL LOW (ref 0.35–4.50)

## 2016-12-01 LAB — T3, FREE: T3, Free: 2.3 pg/mL (ref 2.3–4.2)

## 2016-12-22 ENCOUNTER — Emergency Department: Payer: Medicare Other

## 2016-12-22 ENCOUNTER — Encounter: Payer: Self-pay | Admitting: *Deleted

## 2016-12-22 ENCOUNTER — Emergency Department
Admission: EM | Admit: 2016-12-22 | Discharge: 2016-12-22 | Disposition: A | Discharge status: Home / Self Care | Payer: Medicare Other | Attending: Emergency Medicine | Admitting: Emergency Medicine

## 2016-12-22 DIAGNOSIS — N183 Chronic kidney disease, stage 3 (moderate): Secondary | ICD-10-CM | POA: Diagnosis not present

## 2016-12-22 DIAGNOSIS — J449 Chronic obstructive pulmonary disease, unspecified: Secondary | ICD-10-CM | POA: Insufficient documentation

## 2016-12-22 DIAGNOSIS — Z86718 Personal history of other venous thrombosis and embolism: Secondary | ICD-10-CM | POA: Diagnosis not present

## 2016-12-22 DIAGNOSIS — R42 Dizziness and giddiness: Secondary | ICD-10-CM | POA: Diagnosis not present

## 2016-12-22 DIAGNOSIS — I25810 Atherosclerosis of coronary artery bypass graft(s) without angina pectoris: Secondary | ICD-10-CM | POA: Insufficient documentation

## 2016-12-22 DIAGNOSIS — Z7982 Long term (current) use of aspirin: Secondary | ICD-10-CM | POA: Diagnosis not present

## 2016-12-22 DIAGNOSIS — R55 Syncope and collapse: Secondary | ICD-10-CM | POA: Diagnosis not present

## 2016-12-22 DIAGNOSIS — Z87891 Personal history of nicotine dependence: Secondary | ICD-10-CM | POA: Diagnosis not present

## 2016-12-22 DIAGNOSIS — Z79899 Other long term (current) drug therapy: Secondary | ICD-10-CM | POA: Insufficient documentation

## 2016-12-22 DIAGNOSIS — Z8679 Personal history of other diseases of the circulatory system: Secondary | ICD-10-CM | POA: Diagnosis not present

## 2016-12-22 DIAGNOSIS — I130 Hypertensive heart and chronic kidney disease with heart failure and stage 1 through stage 4 chronic kidney disease, or unspecified chronic kidney disease: Secondary | ICD-10-CM | POA: Diagnosis not present

## 2016-12-22 DIAGNOSIS — I509 Heart failure, unspecified: Secondary | ICD-10-CM | POA: Insufficient documentation

## 2016-12-22 HISTORY — DX: Abdominal aortic aneurysm, without rupture: I71.4

## 2016-12-22 HISTORY — DX: Abdominal aortic aneurysm, without rupture, unspecified: I71.40

## 2016-12-22 LAB — BASIC METABOLIC PANEL
ANION GAP: 6 (ref 5–15)
BUN: 20 mg/dL (ref 6–20)
CALCIUM: 8.6 mg/dL — AB (ref 8.9–10.3)
CO2: 25 mmol/L (ref 22–32)
Chloride: 108 mmol/L (ref 101–111)
Creatinine, Ser: 1.27 mg/dL — ABNORMAL HIGH (ref 0.61–1.24)
GFR, EST NON AFRICAN AMERICAN: 54 mL/min — AB (ref >60)
Glucose, Bld: 86 mg/dL (ref 65–99)
POTASSIUM: 4.5 mmol/L (ref 3.5–5.1)
Sodium: 139 mmol/L (ref 135–145)

## 2016-12-22 LAB — URINALYSIS, COMPLETE (UACMP) WITH MICROSCOPIC
BILIRUBIN URINE: NEGATIVE
Bacteria, UA: NONE SEEN
GLUCOSE, UA: NEGATIVE mg/dL
HGB URINE DIPSTICK: NEGATIVE
Ketones, ur: NEGATIVE mg/dL
LEUKOCYTES UA: NEGATIVE
NITRITE: NEGATIVE
PROTEIN: NEGATIVE mg/dL
Specific Gravity, Urine: 1.008 (ref 1.005–1.030)
pH: 6 (ref 5.0–8.0)

## 2016-12-22 LAB — CBC
HEMATOCRIT: 45.9 % (ref 40.0–52.0)
HEMOGLOBIN: 15.5 g/dL (ref 13.0–18.0)
MCH: 30.2 pg (ref 26.0–34.0)
MCHC: 33.8 g/dL (ref 32.0–36.0)
MCV: 89.6 fL (ref 80.0–100.0)
Platelets: 166 10*3/uL (ref 150–440)
RBC: 5.12 MIL/uL (ref 4.40–5.90)
RDW: 14.8 % — ABNORMAL HIGH (ref 11.5–14.5)
WBC: 5.9 10*3/uL (ref 3.8–10.6)

## 2016-12-22 LAB — GLUCOSE, CAPILLARY: Glucose-Capillary: 89 mg/dL (ref 65–99)

## 2016-12-22 LAB — TROPONIN I
Troponin I: <0.03 ng/mL (ref <0.03)
Troponin I: <0.03 ng/mL (ref <0.03)

## 2016-12-22 MED ORDER — SODIUM CHLORIDE 0.9 % IV BOLUS (SEPSIS)
500.0000 mL | Freq: Once | INTRAVENOUS | Status: AC
  Administered 2016-12-22: 500 mL via INTRAVENOUS

## 2016-12-22 NOTE — ED Triage Notes (Signed)
PT arrived from home via EMS reporting a near syncopal episode while working in garage. Pt reports feeling dizzy, lightheaded and nauseaous.Pt deneis LOC. Wife reports he was not responding to her throughout the episode.  EMS reports pt was cool and clammy upon their arrival and "very" diaphoretic. Pt no longer on blood thinners.  Sitting 110/70 standing 90/60 HR 70 NSR CBG 90 200cc NS via IV  History:  AAA triple bipass brain anurysym blood clot in right leg - untreated at this time but filter placed and pt reports it is reguarly checked and pulses are intact.

## 2016-12-22 NOTE — ED Provider Notes (Addendum)
[facility] Emergency Department Provider Note  ____________________________________________   I have reviewed the triage vital signs and the nursing notes.   HISTORY  Chief Complaint Near Syncope    HPI [name] is a 76 y.o. male Who presents today feeling much better than he did earlier apparently.Patient is a vasculopath, with a history of CAD CHF, intraventricular hemorrhage on Plavix, DVT with stenting in the past, and AAA which is well monitored, aortic calcifications, hypertension, bypass, carotid stenosis, essentially, any vascular pathology 1 can imagine the patient has. He was in his normal state of health today when he went onto a very hot garage started sweeping, he was sweeping and moving around for about a half hour. It is very high heat index today. He was near noon when he was out there. He states he only had a donut to eat and a cup of coffee all day long which is atypical usually he has more food. He became lightheaded. He denies any vertigo. Denies any focal numbness or weakness chest pain or shortness of breath. He went in the house and sat down and was not unconscious but seen to be minimally interactive with his wife briefly. There was no seizure activity. There was no headache, no abdominal pain no melena bright red blood per rectum or vomiting. He was apparently sweaty. EMS found him to be orthostatic, they administered approximately 100 cccc of fluid and at this time he feels back to normal.   Past Medical History:  Diagnosis Date  . AAA (abdominal aortic aneurysm) ([place])   . Aortic calcification ([place]) 05/01/2013  . Arthritis    "hands; not dx'd" (12/26/2012)  . Bilateral renal artery stenosis ([place])    a. 06/2013 Angio/PTA: LRA: 95 (5x18 Herculink stent), RRA 60ost, celiac/SMA nl, RCIA patent stents extending into dist Ao, RIIA 70ost, REIA min irregs, LCIA patent stent, LIIA 60-70ost, LEIA min irregs.  . CHF (congestive heart failure) ([place])    . Coronary artery disease   . Hypertension   . Hypothyroidism   . Intraventricular hemorrhage ([place]) 07/12/2012  . Peripheral arterial disease ([place])    a. Previous left lower extremity stenting by Dr. Jamal Collin;  b. 12/2012 s/p bilat ostial common iliac stenting.  . Pneumonia   . Right leg DVT ([place]) 08/13/2012  . Subclavian artery stenosis, left ([place]) 05/2012   Status post stenting of the ostium and self-expanding stent placement to the left axillary artery  . Tobacco abuse    d/c 2012  . Venous insufficiency     Patient Active Problem List   Diagnosis Date Noted  . RAS (renal artery stenosis) ([place]) 07/10/2013  . Paroxysmal a-fib: briefly in ED now in NSR 05/24/2013  . Aortic calcification ([place]) 05/01/2013  . CKD (chronic kidney disease) stage 3, GFR 30-59 ml/min 05/01/2013  . Bilateral renal artery stenosis ([place])   . Right leg DVT ([place]) 08/13/2012  . HTN (hypertension) 07/15/2012  . Intraventricular hemorrhage ([place]) 07/12/2012  . Peripheral arterial disease ([place])   . Ischemic cardiomyopathy 07/08/2011  . CAD (coronary artery disease), native coronary artery 07/07/2011  . Subclavian arterial stenosis ([place]) 04/25/2011  . BPH (benign prostatic hyperplasia) 12/29/2010  . COPD (chronic obstructive pulmonary disease) ([place]) 01/08/2010  . Abdominal aortic aneurysm ([place]) 11/06/2009  . HYPERCHOLESTEROLEMIA 10/09/2009  . TOBACCO ABUSE 04/11/2008  . PROSTATE SPECIFIC ANTIGEN, ELEVATED 04/11/2008  . Hypothyroidism 09/07/2006    Past Surgical History:  Procedure Laterality Date  . ABDOMINAL ANGIOGRAM  07/10/2013   WITH  BI-FEMORAL RUNOFF       DR ARIDA  . ABDOMINAL AORTAGRAM N/A 12/26/2012   Procedure: ABDOMINAL Maxcine Ham;  Surgeon: Wellington Hampshire, MD;  Location: [facility] CATH [facility];  Service: Cardiovascular;  Laterality: N/A;  . ABDOMINAL AORTAGRAM N/A 07/10/2013   Procedure: ABDOMINAL Maxcine Ham;  Surgeon: Wellington Hampshire, MD;  Location: [facility] CATH LAB;  Service: Cardiovascular;  Laterality: N/A;  .  ANGIOPLASTY / STENTING FEMORAL  05/2012  . ANGIOPLASTY / STENTING ILIAC Bilateral 12/26/2012  . ARCH AORTOGRAM N/A 06/20/2012   Procedure: ARCH AORTOGRAM;  Surgeon: Wellington Hampshire, MD;  Location: [facility];  Service: Cardiovascular;  Laterality: N/A;  . CARDIAC CATHETERIZATION  2/14   [facility]  . CHOLECYSTECTOMY  1990's  . CORONARY ARTERY BYPASS GRAFT  07/11/2011   Procedure: CORONARY ARTERY BYPASS GRAFTING (CABG);  Surgeon: Grace Isaac, MD;  Location: [facility];  Service: Open Heart Surgery;  Laterality: N/A;  Times 3. On Pump. Using right greater saphenous vein and left internal mammary artery.   Marland Kitchen HERNIA REPAIR    . INSERTION OF ILIAC STENT Bilateral 12/26/2012   Procedure: INSERTION OF ILIAC STENT;  Surgeon: Wellington Hampshire, MD;  Location: [facility];  Service: Cardiovascular;  Laterality: Bilateral;  Bilateral Common Iliac Artery  . LEFT AND RIGHT HEART CATHETERIZATION WITH CORONARY ANGIOGRAM  07/07/2011   Procedure: LEFT AND RIGHT HEART CATHETERIZATION WITH CORONARY ANGIOGRAM;  Surgeon: Minna Merritts, MD;  Location: [facility] CATH LAB;  Service: Cardiovascular;;  . PERCUTANEOUS STENT INTERVENTION  06/20/2012   Procedure: PERCUTANEOUS STENT INTERVENTION;  Surgeon: Wellington Hampshire, MD;  Location: [facility] C[facility] LAB;  Service: Cardiovascular;;  . RENAL ARTERY ANGIOPLASTY Left 07/10/2013   DR ARIDA  . SUBCLAVIAN ARTERY STENT  05/2012   "2 stents" (12/26/2012)  . TOOTH EXTRACTION  Spring 2017   mulitple (6)  . VENA CAVA FILTER PLACEMENT  07/2012   Removable    Prior to Admission medications   Medication Sig Start Date End Date Taking? Authorizing Provider  amLODipine (NORVASC) 5 MG tablet TAKE ONE TABLET BY MOUTH EVERY DAY. 06/21/16   Wellington Hampshire, MD  aspirin EC 81 MG tablet Start 1 tablet daily a week from 08/01/13 08/01/13   Wellington Hampshire, MD  atorvastatin (LIPITOR) 20 MG tablet TAKE 1 TABLET BY MOUTH DAILY AT 6PM 08/16/16   Wellington Hampshire, MD  carvedilol (COREG) 6.25 MG tablet TAKE 1 TABLET TWICE  DAILY WITH MEALS 11/23/16   Copland, Frederico Hamman, MD  levothyroxine (SYNTHROID, LEVOTHROID) 150 MCG tablet TAKE ONE TABLET BY MOUTH EVERY DAY BEFORE BREAKFAST 09/29/16   Copland, Frederico Hamman, MD  sildenafil (REVATIO) 20 MG tablet Generic Revatio / Sildanefil 20 mg. 2 - 5 tabs 30 mins prior to intercourse. 09/29/16   CoplandFrederico Hamman, MD    Allergies Plavix [clopidogrel bisulfate]  Family History  Problem Relation Age of Onset  . Alcohol abuse Father   . Cirrhosis Father   . Hypothyroidism Sister     Social History Social History  Substance Use Topics  . Smoking status: Former Smoker    Packs/day: 1.00    Years: 50.00    Types: Cigarettes    Quit date: 12/25/2010  . Smokeless tobacco: Never Used  . Alcohol use No    Review of Systems Constitutional: No fever/chills Eyes: No visual changes. ENT: No sore throat. No stiff neck no neck pain Cardiovascular: Denies chest pain. Respiratory: Denies shortness of breath. Gastrointestinal:   no vomiting.  No diarrhea.  No constipation.  Genitourinary: Negative for dysuria. Musculoskeletal: Negative lower extremity swelling Skin: Negative for rash. Neurological: Negative for severe headaches, focal weakness or numbness.   ____________________________________________   PHYSICAL EXAM:  VITAL SIGNS: ED Triage Vitals  Enc Vitals Group     BP 12/22/16 1122 (!) 160/71     Pulse Rate 12/22/16 1122 66     Resp 12/22/16 1122 17     Temp 12/22/16 1122 98.2 F (36.8 C)     Temp Source 12/22/16 1122 Oral     SpO2 12/22/16 1122 98 %     Weight 12/22/16 1123 165 lb (74.8 kg)     Height 12/22/16 1123 5\' 9"  (1.753 m)     Head Circumference --      Peak Flow --      Pain Score --      Pain Loc --      Pain Edu? --      Excl. in [place]? --     Constitutional: Alert and oriented. Well appearing and in no acute distress. Eyes: Conjunctivae are normal Head: Atraumatic HEENT: No congestion/rhinnorhea. Mucous membranes are moist.  Oropharynx  non-erythematous Neck:   Nontender with no meningismus, no masses, no stridor Cardiovascular: Normal rate, regular rhythm. Grossly normal heart sounds.  Good peripheral circulation. Respiratory: Normal respiratory effort.  No retractions. Lungs CTAB. Abdominal: Soft and nontender. No distention. No guarding no rebound Back:  There is no focal tenderness or step off.  there is no midline tenderness there are no lesions noted. there is no CVA tenderness Musculoskeletal: No lower extremity tenderness, no upper extremity tenderness. No joint effusions, no DVT signs strong distal pulses no edema Neurologic:  Normal speech and language. No gross focal neurologic deficits are appreciated.  Skin:  Skin is warm, dry and intact. No rash noted. Psychiatric: Mood and affect are normal. Speech and behavior are normal.  ____________________________________________   LABS (all labs ordered are listed, but only abnormal results are displayed)  Labs Reviewed  BASIC METABOLIC PANEL - Abnormal; Notable for the following:       Result Value   Creatinine, Ser 1.27 (*)    Calcium 8.6 (*)    GFR calc non Af Amer 54 (*)    All other components within normal limits  CBC - Abnormal; Notable for the following:    RDW 14.8 (*)    All other components within normal limits  TROPONIN I  URINALYSIS, COMPLETE (UACMP) WITH MICROSCOPIC  CBG MONITORING, ED   ____________________________________________  EKG  I personally interpreted any EKGs ordered by me or triage Sinus rhythm at 60 beats per an acute ST elevation or depression normal axis unremarkable EKG ____________________________________________  RADIOLOGY  I reviewed any imaging ordered by me or triage that were performed during my shift and, if possible, patient and/or family made aware of any abnormal findings. ____________________________________________   PROCEDURES  Procedure(s) performed: None  Procedures  Critical Care performed:  None  ____________________________________________   INITIAL IMPRESSION / ASSESSMENT AND PLAN / ED COURSE  Pertinent labs & imaging results that were available during my care of the patient were reviewed by me and considered in my medical decision making (see chart for details).  After IV fluid patient a longer orthostatic, he feels much better except for his hungry and had one done at this morning. We will give him food and check a blood sugar. The list aphasic and possibly cause this patient to feel lightheaded is pretty much infinite however, there suddenly  Not  at this time appear to be any evidence of a ruptured AA PE, ACS, dissection, CVA. We did do a CT of the head which is negative blood work is reassuring chest x-ray is reassuring abdominal exams serially showed no evidence of tenderness and nothing to suggest ruptured AAA, hemoglobin is reassuring nothing to suggest GI bleed, patient is very well-appearing after IV fluid he is no longer orthostatic. He feels much better. We will feed him and get a second set of chronic enzymes and reassess  ----------------------------------------- 2:28 PM on 12/22/2016 -----------------------------------------  Pt in nad with no sx.  He has serial negative abdominal exams with no evidence of AAA, he has no chest pain or shortness of breath nothing to suggest ACS PE or dissection, clinically, I don't detect any clear evidence of a acute DVT, patient has no evidence of CVA, CT is negative chest x-ray is reassuring, we wil cardiac markers, we're waiting urinalysis,f those are negative, I have offered the patient admission to hospital and advised that I cannot ensure safety if they go home, but patient adamantly would like to leave. We'll see the rest of her results show therefore and try to adjust our intervention to his requests  ----------------------------------------- 3:31 PM on 12/22/2016 -----------------------------------------  Pt requesting iv  be pulled as he is insisting he will not stay in the hospital.  He understands customary risks of leaving as described, return precautions and f/u given and understood.   ----------------------------------------- 3:54 PM on 12/22/2016 -----------------------------------------  D/w dr. Fletcher Anon who reviewed labs and ecg and other findings and agrees w/ mgt and has no furhter recommendations. Will f/u.      ____________________________________________   FINAL CLINICAL IMPRESSION(S) / ED DIAGNOSES  Final diagnoses:  None      This chart was dictated using voice recognition software.  Despite best efforts to proofread,  errors can occur which can change meaning.      Schuyler Amor, MD 12/22/16 1315    Schuyler Amor, MD 12/22/16 1429    Schuyler Amor, MD 12/22/16 1531    Schuyler Amor, MD 12/22/16 [phone]

## 2016-12-22 NOTE — Discharge Instructions (Signed)
We have discussed admission to the hospital but you would prefer to go home. This is your choice but it does limit our ability to monitor you. Please take it easy over the next few days and see Dr. Fletcher Anon, who is expecting to see you, as well as your pcp.  If you change your mind about admission, or you have chest pain, shortness of breath, abdominal pain, bleeding, fever, or you feel worse in any way, please return to the emergency department.

## 2016-12-22 NOTE — ED Notes (Signed)
Pt and family updated on results and plan of care. Family verbalized they were worried that a similar episode occurred a couple months prior to his first heart cath and admission to the hospital. MD made aware. Warm blanket provided. Pt resting at this time.

## 2016-12-22 NOTE — ED Notes (Signed)
Pt in NAD at this time and continues to report a desire to go home. PT denies dizziness , weakness, lightheadedness or other symptoms at this time. Pt able to ambulate independently. Family with pt at this time.

## 2016-12-22 NOTE — ED Notes (Signed)
Patient back from CT. Refusing to be placed on cardiac monitor at this time. Primary RN made aware.

## 2016-12-22 NOTE — ED Notes (Signed)
Pt sitting on edge of bed talking to family. Pt verbalized feeling better at this time. Pt was given ensure and ice cream by the RN. PT able to eat without difficulty.

## 2016-12-22 NOTE — ED Notes (Signed)
PT reports sudden onset of shakiness and feeling weak. PT able to stand and has no noted decrease in B. No change in skin color. No diaphoresis, nausea, CP or resp changes. Pt reports feeling hungry. CBG WNL. RN called dietary and requested ensure per MD verbal order for pt to eat. PT does not have teeth at this time.

## 2016-12-23 ENCOUNTER — Encounter: Payer: Self-pay | Admitting: Emergency Medicine

## 2016-12-23 ENCOUNTER — Observation Stay
Admission: EM | Admit: 2016-12-23 | Discharge: 2016-12-25 | Disposition: A | Discharge status: Home / Self Care | Payer: Medicare Other | Attending: Internal Medicine | Admitting: Internal Medicine

## 2016-12-23 DIAGNOSIS — I255 Ischemic cardiomyopathy: Secondary | ICD-10-CM | POA: Insufficient documentation

## 2016-12-23 DIAGNOSIS — I6522 Occlusion and stenosis of left carotid artery: Secondary | ICD-10-CM | POA: Diagnosis not present

## 2016-12-23 DIAGNOSIS — R531 Weakness: Secondary | ICD-10-CM | POA: Diagnosis not present

## 2016-12-23 DIAGNOSIS — R7989 Other specified abnormal findings of blood chemistry: Secondary | ICD-10-CM | POA: Diagnosis not present

## 2016-12-23 DIAGNOSIS — M19042 Primary osteoarthritis, left hand: Secondary | ICD-10-CM | POA: Insufficient documentation

## 2016-12-23 DIAGNOSIS — E7800 Pure hypercholesterolemia, unspecified: Secondary | ICD-10-CM | POA: Insufficient documentation

## 2016-12-23 DIAGNOSIS — I951 Orthostatic hypotension: Secondary | ICD-10-CM | POA: Diagnosis not present

## 2016-12-23 DIAGNOSIS — R42 Dizziness and giddiness: Secondary | ICD-10-CM | POA: Diagnosis not present

## 2016-12-23 DIAGNOSIS — Z951 Presence of aortocoronary bypass graft: Secondary | ICD-10-CM | POA: Insufficient documentation

## 2016-12-23 DIAGNOSIS — I5032 Chronic diastolic (congestive) heart failure: Secondary | ICD-10-CM | POA: Insufficient documentation

## 2016-12-23 DIAGNOSIS — M19041 Primary osteoarthritis, right hand: Secondary | ICD-10-CM | POA: Diagnosis not present

## 2016-12-23 DIAGNOSIS — Z8673 Personal history of transient ischemic attack (TIA), and cerebral infarction without residual deficits: Secondary | ICD-10-CM | POA: Insufficient documentation

## 2016-12-23 DIAGNOSIS — E860 Dehydration: Secondary | ICD-10-CM | POA: Diagnosis not present

## 2016-12-23 DIAGNOSIS — Z79899 Other long term (current) drug therapy: Secondary | ICD-10-CM | POA: Insufficient documentation

## 2016-12-23 DIAGNOSIS — I701 Atherosclerosis of renal artery: Secondary | ICD-10-CM | POA: Diagnosis not present

## 2016-12-23 DIAGNOSIS — I739 Peripheral vascular disease, unspecified: Secondary | ICD-10-CM | POA: Insufficient documentation

## 2016-12-23 DIAGNOSIS — Z86718 Personal history of other venous thrombosis and embolism: Secondary | ICD-10-CM | POA: Insufficient documentation

## 2016-12-23 DIAGNOSIS — Z888 Allergy status to other drugs, medicaments and biological substances status: Secondary | ICD-10-CM | POA: Insufficient documentation

## 2016-12-23 DIAGNOSIS — N179 Acute kidney failure, unspecified: Secondary | ICD-10-CM | POA: Insufficient documentation

## 2016-12-23 DIAGNOSIS — N183 Chronic kidney disease, stage 3 (moderate): Secondary | ICD-10-CM | POA: Insufficient documentation

## 2016-12-23 DIAGNOSIS — J449 Chronic obstructive pulmonary disease, unspecified: Secondary | ICD-10-CM | POA: Diagnosis not present

## 2016-12-23 DIAGNOSIS — Z95820 Peripheral vascular angioplasty status with implants and grafts: Secondary | ICD-10-CM | POA: Insufficient documentation

## 2016-12-23 DIAGNOSIS — E039 Hypothyroidism, unspecified: Secondary | ICD-10-CM | POA: Insufficient documentation

## 2016-12-23 DIAGNOSIS — I480 Paroxysmal atrial fibrillation: Secondary | ICD-10-CM | POA: Diagnosis not present

## 2016-12-23 DIAGNOSIS — N400 Enlarged prostate without lower urinary tract symptoms: Secondary | ICD-10-CM | POA: Insufficient documentation

## 2016-12-23 DIAGNOSIS — Z87891 Personal history of nicotine dependence: Secondary | ICD-10-CM | POA: Diagnosis not present

## 2016-12-23 DIAGNOSIS — R778 Other specified abnormalities of plasma proteins: Secondary | ICD-10-CM

## 2016-12-23 DIAGNOSIS — I2510 Atherosclerotic heart disease of native coronary artery without angina pectoris: Secondary | ICD-10-CM | POA: Diagnosis not present

## 2016-12-23 DIAGNOSIS — I872 Venous insufficiency (chronic) (peripheral): Secondary | ICD-10-CM | POA: Diagnosis not present

## 2016-12-23 DIAGNOSIS — R55 Syncope and collapse: Secondary | ICD-10-CM | POA: Diagnosis present

## 2016-12-23 DIAGNOSIS — I130 Hypertensive heart and chronic kidney disease with heart failure and stage 1 through stage 4 chronic kidney disease, or unspecified chronic kidney disease: Secondary | ICD-10-CM | POA: Insufficient documentation

## 2016-12-23 DIAGNOSIS — Z7982 Long term (current) use of aspirin: Secondary | ICD-10-CM | POA: Insufficient documentation

## 2016-12-23 NOTE — ED Triage Notes (Signed)
Pt. States he was here last night with same symptoms.  Pt. States dizziness/vertigo that proceeded a rt. Leg cramp.

## 2016-12-23 NOTE — ED Provider Notes (Signed)
[facility] Emergency Department Provider Note   ____________________________________________   First MD Initiated Contact with Patient 12/23/16 2337     (approximate)  I have reviewed the triage vital signs and the nursing notes.   HISTORY  Chief Complaint Dizziness    HPI TAYDEN NICHELSON is a 76 y.o. male brought to the ED from home via EMS with a chief complaint of dizziness. Patient was seen yesterday for same; had a negative CT scan and 2 setsof negative troponins. Told he was dehydrated and his perception was that he was sent home.Per chart review, it looks like patient was offered hospital admission and declined. Wife states yesterday patient slumped over in his chair and had seizure-like activity. She feels like this portion of the history was discounted. Patient felt okay until this evening when he went outside with the dog and had 2 episodes of extreme dizziness which made his feel off-balance. He fell the second time, missing his chair. Did not injure himself. Had a mild headache tonight during the episodes. Denies associated vision changes, neck pain, chest pain, shortness of breath, abdominal pain. Denies recent travel or trauma. Nothing makes his symptoms better or worse.   Past Medical History:  Diagnosis Date  . AAA (abdominal aortic aneurysm) ([place])   . Aortic calcification ([place]) 05/01/2013  . Arthritis    "hands; not dx'd" (12/26/2012)  . Bilateral renal artery stenosis ([employer])    a. 06/2013 Angio/PTA: LRA: 95 (5x18 Herculink stent), RRA 60ost, celiac/SMA nl, RCIA patent stents extending into dist Ao, RIIA 70ost, REIA min irregs, LCIA patent stent, LIIA 60-70ost, LEIA min irregs.  . CHF (congestive heart failure) ([place])   . Coronary artery disease   . Hypertension   . Hypothyroidism   . Intraventricular hemorrhage ([place]) 07/12/2012  . Peripheral arterial disease ([place])    a. Previous left lower extremity stenting by Dr. Jamal Collin;  b. 12/2012 s/p bilat  ostial common iliac stenting.  . Pneumonia   . Right leg DVT ([place]) 08/13/2012  . Subclavian artery stenosis, left ([place]) 05/2012   Status post stenting of the ostium and self-expanding stent placement to the left axillary artery  . Tobacco abuse    d/c 2012  . Venous insufficiency     Patient Active Problem List   Diagnosis Date Noted  . RAS (renal artery stenosis) ([place]) 07/10/2013  . Paroxysmal a-fib: briefly in ED now in NSR 05/24/2013  . Aortic calcification ([place]) 05/01/2013  . CKD (chronic kidney disease) stage 3, GFR 30-59 ml/min 05/01/2013  . Bilateral renal artery stenosis ([place])   . Right leg DVT ([place]) 08/13/2012  . HTN (hypertension) 07/15/2012  . Intraventricular hemorrhage ([place]) 07/12/2012  . Peripheral arterial disease ([place])   . Ischemic cardiomyopathy 07/08/2011  . CAD (coronary artery disease), native coronary artery 07/07/2011  . Subclavian arterial stenosis ([place]) 04/25/2011  . BPH (benign prostatic hyperplasia) 12/29/2010  . COPD (chronic obstructive pulmonary disease) ([place]) 01/08/2010  . Abdominal aortic aneurysm ([place]) 11/06/2009  . HYPERCHOLESTEROLEMIA 10/09/2009  . TOBACCO ABUSE 04/11/2008  . PROSTATE SPECIFIC ANTIGEN, ELEVATED 04/11/2008  . Hypothyroidism 09/07/2006    Past Surgical History:  Procedure Laterality Date  . ABDOMINAL ANGIOGRAM  07/10/2013   WITH BI-FEMORAL RUNOFF       DR ARIDA  . ABDOMINAL AORTAGRAM N/A 12/26/2012   Procedure: ABDOMINAL Maxcine Ham;  Surgeon: Wellington Hampshire, MD;  Location: [facility] CATH LAB;  Service: Cardiovascular;  Laterality: N/A;  . ABDOMINAL AORTAGRAM N/A 07/10/2013   Procedure: ABDOMINAL  Maxcine Ham;  Surgeon: Wellington Hampshire, MD;  Location: [facility] CATH LAB;  Service: Cardiovascular;  Laterality: N/A;  . ANGIOPLASTY / STENTING FEMORAL  05/2012  . ANGIOPLASTY / STENTING ILIAC Bilateral 12/26/2012  . ARCH AORTOGRAM N/A 06/20/2012   Procedure: ARCH AORTOGRAM;  Surgeon: Wellington Hampshire, MD;  Location: [facility] CATH LAB;  Service: Cardiovascular;   Laterality: N/A;  . CARDIAC CATHETERIZATION  2/14   [facility]  . CHOLECYSTECTOMY  1990's  . CORONARY ARTERY BYPASS GRAFT  07/11/2011   Procedure: CORONARY ARTERY BYPASS GRAFTING (CABG);  Surgeon: Grace Isaac, MD;  Location: [facility];  Service: Open Heart Surgery;  Laterality: N/A;  Times 3. On Pump. Using right greater saphenous vein and left internal mammary artery.   Marland Kitchen HERNIA REPAIR    . INSERTION OF ILIAC STENT Bilateral 12/26/2012   Procedure: INSERTION OF ILIAC STENT;  Surgeon: Wellington Hampshire, MD;  Location: [facility] CATH LAB;  Service: Cardiovascular;  Laterality: Bilateral;  Bilateral Common Iliac Artery  . LEFT AND RIGHT HEART CATHETERIZATION WITH CORONARY ANGIOGRAM  07/07/2011   Procedure: LEFT AND RIGHT HEART CATHETERIZATION WITH CORONARY ANGIOGRAM;  Surgeon: Minna Merritts, MD;  Location: [facility] CATH LAB;  Service: Cardiovascular;;  . PERCUTANEOUS STENT INTERVENTION  06/20/2012   Procedure: PERCUTANEOUS STENT INTERVENTION;  Surgeon: Wellington Hampshire, MD;  Location: [facility] CATH LAB;  Service: Cardiovascular;;  . RENAL ARTERY ANGIOPLASTY Left 07/10/2013   DR ARIDA  . SUBCLAVIAN ARTERY STENT  05/2012   "2 stents" (12/26/2012)  . TOOTH EXTRACTION  Spring 2017   mulitple (6)  . VENA CAVA FILTER PLACEMENT  07/2012   Removable    Prior to Admission medications   Medication Sig Start Date End Date Taking? Authorizing Provider  amLODipine (NORVASC) 5 MG tablet TAKE ONE TABLET BY MOUTH EVERY DAY. 06/21/16   Wellington Hampshire, MD  aspirin EC 81 MG tablet Start 1 tablet daily a week from 08/01/13 08/01/13   Wellington Hampshire, MD  atorvastatin (LIPITOR) 20 MG tablet TAKE 1 TABLET BY MOUTH DAILY AT 6PM 08/16/16   Wellington Hampshire, MD  carvedilol (COREG) 6.25 MG tablet TAKE 1 TABLET TWICE DAILY WITH MEALS 11/23/16   Copland, Frederico Hamman, MD  levothyroxine (SYNTHROID, LEVOTHROID) 150 MCG tablet TAKE ONE TABLET BY MOUTH EVERY DAY BEFORE BREAKFAST 09/29/16   Copland, Frederico Hamman, MD  sildenafil (REVATIO) 20 MG tablet Generic Revatio  / Sildanefil 20 mg. 2 - 5 tabs 30 mins prior to intercourse. Patient not taking: Reported on 12/22/2016 09/29/16   Owens Loffler, MD    Allergies Plavix [clopidogrel bisulfate]  Family History  Problem Relation Age of Onset  . Alcohol abuse Father   . Cirrhosis Father   . Hypothyroidism Sister     Social History Social History  Substance Use Topics  . Smoking status: Former Smoker    Packs/day: 1.00    Years: 50.00    Types: Cigarettes    Quit date: 12/25/2010  . Smokeless tobacco: Never Used  . Alcohol use No    Review of Systems  Constitutional: No fever/chills. Eyes: No visual changes. ENT: No sore throat. Cardiovascular: Denies chest pain. Respiratory: Denies shortness of breath. Gastrointestinal: No abdominal pain.  No nausea, no vomiting.  No diarrhea.  No constipation. Genitourinary: Negative for dysuria. Musculoskeletal: Negative for back pain. Skin: Negative for rash. Neurological: Positive for headache and dizziness. Negative for focal weakness or numbness.   ____________________________________________   PHYSICAL EXAM:  VITAL SIGNS: ED Triage Vitals  Enc Vitals Group  BP 12/23/16 2326 (!) 146/77     Pulse Rate 12/23/16 2326 71     Resp 12/23/16 2326 18     Temp 12/23/16 2326 97.9 F (36.6 C)     Temp Source 12/23/16 2326 Oral     SpO2 12/23/16 2322 99 %     Weight 12/23/16 2327 165 lb (74.8 kg)     Height 12/23/16 2327 5\' 9"  (1.753 m)     Head Circumference --      Peak Flow --      Pain Score --      Pain Loc --      Pain Edu? --      Excl. in [place]? --     Constitutional: Alert and oriented. Well appearing and in no acute distress. Eyes: Conjunctivae are normal. PERRL. EOMI. Head: Atraumatic. Nose: No congestion/rhinnorhea. Mouth/Throat: Mucous membranes are moist.  Oropharynx non-erythematous. Neck: No stridor.  No carotid bruits. Cardiovascular: Normal rate, regular rhythm. Grossly normal heart sounds.  Good peripheral  circulation. Respiratory: Normal respiratory effort.  No retractions. Lungs CTAB. Gastrointestinal: Soft and nontender. No distention. No abdominal bruits. No CVA tenderness. Musculoskeletal: No lower extremity tenderness nor edema.  No joint effusions. Neurologic:  Alert and oriented x 3. Normal speech and language. No gross focal neurologic deficits are appreciated.  Skin:  Skin is warm, dry and intact. No rash noted. Psychiatric: Mood and affect are normal. Speech and behavior are normal.  ____________________________________________   LABS (all labs ordered are listed, but only abnormal results are displayed)  Labs Reviewed  CBC WITH DIFFERENTIAL/PLATELET - Abnormal; Notable for the following:       Result Value   RDW 15.3 (*)    All other components within normal limits  COMPREHENSIVE METABOLIC PANEL - Abnormal; Notable for the following:    Glucose, Bld 107 (*)    BUN 24 (*)    Creatinine, Ser 1.35 (*)    ALT 10 (*)    GFR calc non Af Amer 50 (*)    GFR calc Af Amer 58 (*)    All other components within normal limits  TROPONIN I - Abnormal; Notable for the following:    Troponin I 0.03 (*)    All other components within normal limits  URINALYSIS, COMPLETE (UACMP) WITH MICROSCOPIC   ____________________________________________  EKG  ED ECG REPORT I, SUNG,JADE J, the attending physician, personally viewed and interpreted this ECG.   Date: 12/24/2016  EKG Time: 0006  Rate: 71  Rhythm: normal EKG, normal sinus rhythm  Axis: WNL  Intervals:none  ST&T Change: Nonspecific  ____________________________________________  RADIOLOGY  Mr Jeri Cos And Wo Contrast  Result Date: 12/24/2016 CLINICAL DATA:  76 y/o M; periodic dizziness and loss of dexterity in the hands for 2 days. EXAM: MRI HEAD WITHOUT AND WITH CONTRAST TECHNIQUE: Multiplanar, multiecho pulse sequences of the brain and surrounding structures were obtained without and with intravenous contrast. CONTRAST:  31mL  MULTIHANCE GADOBENATE DIMEGLUMINE 529 MG/ML IV SOLN COMPARISON:  12/22/2016 CT of the head.  07/13/2012 MRI of the head. FINDINGS: Brain: No acute infarction, hemorrhage, hydrocephalus, extra-axial collection or mass lesion. A few nonspecific foci of T2 FLAIR hyperintense signal abnormality in subcortical and periventricular white matter is compatible with mild chronic microvascular ischemic changes for age. Mild brain parenchymal volume loss. Small stable chronic white matter infarct within the right posterior frontal periventricular white matter and small chronic lacunar infarcts in the right cerebellar hemisphere. No abnormal enhancement. Vascular: Normal flow voids. Skull  and upper cervical spine: Normal marrow signal. Sinuses/Orbits: Negative. Other: None. IMPRESSION: 1. No acute intracranial abnormality or abnormal enhancement of the brain. 2. Mild for age chronic microvascular ischemic changes and mild parenchymal volume loss of the brain. Small chronic lacunar infarcts in right frontal periventricular white matter and right cerebellum. Electronically Signed   By: Kristine Garbe M.D.   On: 12/24/2016 01:35    ____________________________________________   PROCEDURES  Procedure(s) performed: None  Procedures  Critical Care performed: No  ____________________________________________   INITIAL IMPRESSION / ASSESSMENT AND PLAN / ED COURSE  Pertinent labs & imaging results that were available during my care of the patient were reviewed by me and considered in my medical decision making (see chart for details).  76 year old male with some significant vascular issues dizziness and ataxia. States a similar event occurred years ago prior to his heart attack. Will obtain screening lab work, MRI brain. Anticipate hospitalization.  Clinical Course as of Dec 25 246  Sat Dec 24, 2016  0239 Updated patient and family of MRI results. Mildly elevated troponin from last visit 2 days ago.  Will administer ASA and discuss with hospitalist to evaluate patient in the emergency department for admission.  [JS]    Clinical Course User Index [JS] Paulette Blanch, MD     ____________________________________________   FINAL CLINICAL IMPRESSION(S) / ED DIAGNOSES  Final diagnoses:  Dizziness  Elevated troponin      NEW MEDICATIONS STARTED DURING THIS VISIT:  New Prescriptions   No medications on file     Note:  This document was prepared using Dragon voice recognition software and may include unintentional dictation errors.    Paulette Blanch, MD 12/24/16 [id number]

## 2016-12-23 NOTE — ED Notes (Signed)
Pt. States he was here last night with similar symptoms.  Pt. States tonight he went outside to check on pets.  Pt. States upon coming back into house pt. States a cramping sensation to rt. Calf.  Pt. States soon after cramp patient began feeling dizzy and could not keep balance.  Pt. Also states head pressure.  Pt. States hx of of blood clot to rt. Leg and pt. States having filter placed above rt. Knee a couple years ago.

## 2016-12-24 ENCOUNTER — Encounter: Payer: Self-pay | Admitting: Internal Medicine

## 2016-12-24 ENCOUNTER — Emergency Department: Payer: Medicare Other

## 2016-12-24 DIAGNOSIS — R55 Syncope and collapse: Secondary | ICD-10-CM | POA: Diagnosis present

## 2016-12-24 DIAGNOSIS — R42 Dizziness and giddiness: Secondary | ICD-10-CM | POA: Diagnosis not present

## 2016-12-24 DIAGNOSIS — I509 Heart failure, unspecified: Secondary | ICD-10-CM | POA: Diagnosis not present

## 2016-12-24 DIAGNOSIS — I2510 Atherosclerotic heart disease of native coronary artery without angina pectoris: Secondary | ICD-10-CM | POA: Diagnosis not present

## 2016-12-24 DIAGNOSIS — I951 Orthostatic hypotension: Secondary | ICD-10-CM | POA: Diagnosis not present

## 2016-12-24 LAB — CBC
HCT: 44 % (ref 40.0–52.0)
Hemoglobin: 15 g/dL (ref 13.0–18.0)
MCH: 30 pg (ref 26.0–34.0)
MCHC: 34.1 g/dL (ref 32.0–36.0)
MCV: 87.9 fL (ref 80.0–100.0)
PLATELETS: 158 10*3/uL (ref 150–440)
RBC: 5 MIL/uL (ref 4.40–5.90)
RDW: 14.9 % — ABNORMAL HIGH (ref 11.5–14.5)
WBC: 8.6 10*3/uL (ref 3.8–10.6)

## 2016-12-24 LAB — COMPREHENSIVE METABOLIC PANEL
ALT: 10 U/L — ABNORMAL LOW (ref 17–63)
AST: 17 U/L (ref 15–41)
Albumin: 4.3 g/dL (ref 3.5–5.0)
Alkaline Phosphatase: 83 U/L (ref 38–126)
Anion gap: 8 (ref 5–15)
BUN: 24 mg/dL — ABNORMAL HIGH (ref 6–20)
CHLORIDE: 105 mmol/L (ref 101–111)
CO2: 24 mmol/L (ref 22–32)
Calcium: 9.2 mg/dL (ref 8.9–10.3)
Creatinine, Ser: 1.35 mg/dL — ABNORMAL HIGH (ref 0.61–1.24)
GFR, EST AFRICAN AMERICAN: 58 mL/min — AB (ref >60)
GFR, EST NON AFRICAN AMERICAN: 50 mL/min — AB (ref >60)
Glucose, Bld: 107 mg/dL — ABNORMAL HIGH (ref 65–99)
POTASSIUM: 4.1 mmol/L (ref 3.5–5.1)
SODIUM: 137 mmol/L (ref 135–145)
Total Bilirubin: 0.8 mg/dL (ref 0.3–1.2)
Total Protein: 7.3 g/dL (ref 6.5–8.1)

## 2016-12-24 LAB — BASIC METABOLIC PANEL
ANION GAP: 6 (ref 5–15)
BUN: 24 mg/dL — ABNORMAL HIGH (ref 6–20)
CALCIUM: 9.2 mg/dL (ref 8.9–10.3)
CO2: 26 mmol/L (ref 22–32)
Chloride: 107 mmol/L (ref 101–111)
Creatinine, Ser: 1.61 mg/dL — ABNORMAL HIGH (ref 0.61–1.24)
GFR, EST AFRICAN AMERICAN: 47 mL/min — AB (ref >60)
GFR, EST NON AFRICAN AMERICAN: 40 mL/min — AB (ref >60)
GLUCOSE: 102 mg/dL — AB (ref 65–99)
Potassium: 4.8 mmol/L (ref 3.5–5.1)
SODIUM: 139 mmol/L (ref 135–145)

## 2016-12-24 LAB — CBC WITH DIFFERENTIAL/PLATELET
BASOS ABS: 0.1 10*3/uL (ref 0–0.1)
Basophils Relative: 1 %
EOS ABS: 0.4 10*3/uL (ref 0–0.7)
EOS PCT: 5 %
HCT: 46.9 % (ref 40.0–52.0)
Hemoglobin: 15.7 g/dL (ref 13.0–18.0)
LYMPHS ABS: 2.1 10*3/uL (ref 1.0–3.6)
LYMPHS PCT: 25 %
MCH: 30.2 pg (ref 26.0–34.0)
MCHC: 33.6 g/dL (ref 32.0–36.0)
MCV: 90.1 fL (ref 80.0–100.0)
MONO ABS: 0.6 10*3/uL (ref 0.2–1.0)
Monocytes Relative: 8 %
Neutro Abs: 5.3 10*3/uL (ref 1.4–6.5)
Neutrophils Relative %: 61 %
PLATELETS: 174 10*3/uL (ref 150–440)
RBC: 5.2 MIL/uL (ref 4.40–5.90)
RDW: 15.3 % — ABNORMAL HIGH (ref 11.5–14.5)
WBC: 8.6 10*3/uL (ref 3.8–10.6)

## 2016-12-24 LAB — TROPONIN I
TROPONIN I: 0.03 ng/mL — AB (ref <0.03)
Troponin I: <0.03 ng/mL (ref <0.03)
Troponin I: <0.03 ng/mL (ref <0.03)

## 2016-12-24 MED ORDER — GADOBENATE DIMEGLUMINE 529 MG/ML IV SOLN
15.0000 mL | Freq: Once | INTRAVENOUS | Status: AC | PRN
  Administered 2016-12-24: 15 mL via INTRAVENOUS

## 2016-12-24 MED ORDER — ONDANSETRON HCL 4 MG/2ML IJ SOLN: 4.0000 mg | Freq: Four times a day (QID) | INTRAMUSCULAR | Status: DC | PRN

## 2016-12-24 MED ORDER — ASPIRIN EC 81 MG PO TBEC
81.0000 mg | DELAYED_RELEASE_TABLET | Freq: Every day | ORAL | Status: DC
  Administered 2016-12-24 – 2016-12-25 (×2): 81 mg via ORAL
  Filled 2016-12-24 (×2): qty 1

## 2016-12-24 MED ORDER — ATORVASTATIN CALCIUM 20 MG PO TABS
20.0000 mg | ORAL_TABLET | Freq: Every day | ORAL | Status: DC
  Administered 2016-12-24: 20 mg via ORAL
  Filled 2016-12-24: qty 1

## 2016-12-24 MED ORDER — SENNOSIDES-DOCUSATE SODIUM 8.6-50 MG PO TABS: 1.0000 | ORAL_TABLET | Freq: Every evening | ORAL | Status: DC | PRN

## 2016-12-24 MED ORDER — ONDANSETRON HCL 4 MG PO TABS: 4.0000 mg | ORAL_TABLET | Freq: Four times a day (QID) | ORAL | Status: DC | PRN

## 2016-12-24 MED ORDER — CARVEDILOL 6.25 MG PO TABS
6.2500 mg | ORAL_TABLET | Freq: Two times a day (BID) | ORAL | Status: DC
  Administered 2016-12-25: 6.25 mg via ORAL
  Filled 2016-12-24: qty 1

## 2016-12-24 MED ORDER — ACETAMINOPHEN 650 MG RE SUPP: 650.0000 mg | Freq: Four times a day (QID) | RECTAL | Status: DC | PRN

## 2016-12-24 MED ORDER — ACETAMINOPHEN 325 MG PO TABS
650.0000 mg | ORAL_TABLET | Freq: Four times a day (QID) | ORAL | Status: DC | PRN
  Administered 2016-12-25: 650 mg via ORAL
  Filled 2016-12-24: qty 2

## 2016-12-24 MED ORDER — LEVOTHYROXINE SODIUM 50 MCG PO TABS
150.0000 ug | ORAL_TABLET | Freq: Every day | ORAL | Status: DC
  Administered 2016-12-24 – 2016-12-25 (×2): 150 ug via ORAL
  Filled 2016-12-24 (×2): qty 1

## 2016-12-24 MED ORDER — SODIUM CHLORIDE 0.9% FLUSH
3.0000 mL | Freq: Two times a day (BID) | INTRAVENOUS | Status: DC
  Administered 2016-12-24: 3 mL via INTRAVENOUS

## 2016-12-24 MED ORDER — SODIUM CHLORIDE 0.9 % IV SOLN
INTRAVENOUS | Status: DC
  Administered 2016-12-24 – 2016-12-25 (×2): via INTRAVENOUS

## 2016-12-24 MED ORDER — ENOXAPARIN SODIUM 40 MG/0.4ML ~~LOC~~ SOLN
40.0000 mg | SUBCUTANEOUS | Status: DC
  Administered 2016-12-24: 40 mg via SUBCUTANEOUS
  Filled 2016-12-24: qty 0.4

## 2016-12-24 MED ORDER — CARVEDILOL 6.25 MG PO TABS
6.2500 mg | ORAL_TABLET | Freq: Two times a day (BID) | ORAL | Status: DC
  Filled 2016-12-24: qty 1

## 2016-12-24 NOTE — Care Management Obs Status (Signed)
[facility] NOTIFICATION   Patient Details  Name: ASHAN CUEVA MRN: [id number] Date of Birth: April 29, 1940   Medicare Observation Status Notification Given:  Yes    Eliud Polo A, RN 12/24/2016, 1:36 PM

## 2016-12-24 NOTE — H&P (Signed)
[facility] NAME: [name]    MR#:  [id number]  DATE OF BIRTH:  [birth year]  DATE OF ADMISSION:  12/23/2016  PRIMARY CARE PHYSICIAN: Owens Loffler, MD   REQUESTING/REFERRING PHYSICIAN:   CHIEF COMPLAINT:   Chief Complaint  Patient presents with  . Dizziness    HISTORY OF PRESENT ILLNESS: [name]  is a 76 y.o. male with a known history of abdominal aortic aneurysm, bilateral renal artery stenosis, congestive heart failure, coronary artery disease, hypertension, hypothyroidism presented to the emergency room with dizziness. The dizziness started 1 day ago. Patient was evaluated in the emergency room yesterday but he went home after his troponin was negative. When patient was checking on his dogs in the yard he suddenly felt a cramping right lower extremity and felt dizzy and felt as if his muscles were out of control and he fell down. He gets these spells since last 2 days. In the last 1 day he had 3 spells of dizziness and losing muscle control and posture. No history of any stroke in the past. No history of any head injury. Patient was worked up with MRI brain which showed no acute abnormality. He was again evaluated in the emergency room 2 days troponin is borderline 0.03.  PAST MEDICAL HISTORY:   Past Medical History:  Diagnosis Date  . AAA (abdominal aortic aneurysm) ([place])   . Aortic calcification ([place]) 05/01/2013  . Arthritis    "hands; not dx'd" (12/26/2012)  . Bilateral renal artery stenosis ([place])    a. 06/2013 Angio/PTA: LRA: 95 (5x18 Herculink stent), RRA 60ost, celiac/SMA nl, RCIA patent stents extending into dist Ao, RIIA 70ost, REIA min irregs, LCIA patent stent, LIIA 60-70ost, LEIA min irregs.  . CHF (congestive heart failure) ([place])   . Coronary artery disease   . Hypertension   . Hypothyroidism   . Intraventricular hemorrhage ([place]) 07/12/2012  . Peripheral arterial disease ([place])    a. Previous left lower  extremity stenting by Dr. Jamal Collin;  b. 12/2012 s/p bilat ostial common iliac stenting.  . Pneumonia   . Right leg DVT ([place]) 08/13/2012  . Subclavian artery stenosis, left ([place]) 05/2012   Status post stenting of the ostium and self-expanding stent placement to the left axillary artery  . Tobacco abuse    d/c 2012  . Venous insufficiency     PAST SURGICAL HISTORY: Past Surgical History:  Procedure Laterality Date  . ABDOMINAL ANGIOGRAM  07/10/2013   WITH BI-FEMORAL RUNOFF       DR ARIDA  . ABDOMINAL AORTAGRAM N/A 12/26/2012   Procedure: ABDOMINAL Maxcine Ham;  Surgeon: Wellington Hampshire, MD;  Location: [facility] CATH [facility];  Service: Cardiovascular;  Laterality: N/A;  . ABDOMINAL AORTAGRAM N/A 07/10/2013   Procedure: ABDOMINAL Maxcine Ham;  Surgeon: Wellington Hampshire, MD;  Location: [facility] CATH [facility];  Service: Cardiovascular;  Laterality: N/A;  . ANGIOPLASTY / STENTING FEMORAL  05/2012  . ANGIOPLASTY / STENTING ILIAC Bilateral 12/26/2012  . ARCH AORTOGRAM N/A 06/20/2012   Procedure: ARCH AORTOGRAM;  Surgeon: Wellington Hampshire, MD;  Location: [facility];  Service: Cardiovascular;  Laterality: N/A;  . CARDIAC CATHETERIZATION  2/14   MC  . CHOLECYSTECTOMY  1990's  . CORONARY ARTERY BYPASS GRAFT  07/11/2011   Procedure: CORONARY ARTERY BYPASS GRAFTING (CABG);  Surgeon: Grace Isaac, MD;  Location: [facility];  Service: Open Heart Surgery;  Laterality: N/A;  Times 3. On Pump. Using right greater saphenous vein and left  internal mammary artery.   Marland Kitchen HERNIA REPAIR    . INSERTION OF ILIAC STENT Bilateral 12/26/2012   Procedure: INSERTION OF ILIAC STENT;  Surgeon: Wellington Hampshire, MD;  Location: [facility] CATH [facility];  Service: Cardiovascular;  Laterality: Bilateral;  Bilateral Common Iliac Artery  . LEFT AND RIGHT HEART CATHETERIZATION WITH CORONARY ANGIOGRAM  07/07/2011   Procedure: LEFT AND RIGHT HEART CATHETERIZATION WITH CORONARY ANGIOGRAM;  Surgeon: Minna Merritts, MD;  Location: [facility] C[facility];  Service: Cardiovascular;;  .  PERCUTANEOUS STENT INTERVENTION  06/20/2012   Procedure: PERCUTANEOUS STENT INTERVENTION;  Surgeon: Wellington Hampshire, MD;  Location: [facility] C[facility];  Service: Cardiovascular;;  . RENAL ARTERY ANGIOPLASTY Left 07/10/2013   DR ARIDA  . SUBCLAVIAN ARTERY STENT  05/2012   "2 stents" (12/26/2012)  . TOOTH EXTRACTION  Spring 2017   mulitple (6)  . VENA CAVA FILTER PLACEMENT  07/2012   Removable    SOCIAL HISTORY:  Social History  Substance Use Topics  . Smoking status: Former Smoker    Packs/day: 1.00    Years: 50.00    Types: Cigarettes    Quit date: 12/25/2010  . Smokeless tobacco: Never Used  . Alcohol use No    FAMILY HISTORY:  Family History  Problem Relation Age of Onset  . Alcohol abuse Father   . Cirrhosis Father   . Hypothyroidism Sister     DRUG ALLERGIES:  Allergies  Allergen Reactions  . Plavix [Clopidogrel Bisulfate]     Brain hemorrhage prev while on plavix and aspirin    REVIEW OF SYSTEMS:   CONSTITUTIONAL: No fever,has weakness.  EYES: No blurred or double vision.  EARS, NOSE, AND THROAT: No tinnitus or ear pain.  RESPIRATORY: No cough, shortness of breath, wheezing or hemoptysis.  CARDIOVASCULAR: No chest pain, orthopnea, edema.  GASTROINTESTINAL: No nausea, vomiting, diarrhea or abdominal pain.  GENITOURINARY: No dysuria, hematuria.  ENDOCRINE: No polyuria, nocturia,  HEMATOLOGY: No anemia, easy bruising or bleeding SKIN: No rash or lesion. MUSCULOSKELETAL: No joint pain or arthritis.   NEUROLOGIC: No tingling, numbness, weakness. Has dizziness  PSYCHIATRY: No anxiety or depression.   MEDICATIONS AT HOME:  Prior to Admission medications   Medication Sig Start Date End Date Taking? Authorizing Provider  amLODipine (NORVASC) 5 MG tablet TAKE ONE TABLET BY MOUTH EVERY DAY. 06/21/16   Wellington Hampshire, MD  aspirin EC 81 MG tablet Start 1 tablet daily a week from 08/01/13 08/01/13   Wellington Hampshire, MD  atorvastatin (LIPITOR) 20 MG tablet TAKE 1 TABLET BY MOUTH  DAILY AT 6PM 08/16/16   Wellington Hampshire, MD  carvedilol (COREG) 6.25 MG tablet TAKE 1 TABLET TWICE DAILY WITH MEALS 11/23/16   Copland, Frederico Hamman, MD  levothyroxine (SYNTHROID, LEVOTHROID) 150 MCG tablet TAKE ONE TABLET BY MOUTH EVERY DAY BEFORE BREAKFAST 09/29/16   Copland, Frederico Hamman, MD  sildenafil (REVATIO) 20 MG tablet Generic Revatio / Sildanefil 20 mg. 2 - 5 tabs 30 mins prior to intercourse. Patient not taking: Reported on 12/22/2016 09/29/16   Copland, Frederico Hamman, MD      PHYSICAL EXAMINATION:   VITAL SIGNS: Blood pressure 140/71, pulse 76, temperature 97.9 F (36.6 C), temperature source Oral, resp. rate 18, height 5\' 9"  (1.753 m), weight 74.8 kg (165 lb), SpO2 97 %.  GENERAL:  76 y.o.-year-old patient lying in the bed with no acute distress.  EYES: Pupils equal, round, reactive to light and accommodation. No scleral icterus. Extraocular muscles intact.  HEENT: Head atraumatic, normocephalic. Oropharynx and nasopharynx clear.  NECK:  Supple, no jugular venous distention. No thyroid enlargement, no tenderness.  LUNGS: Normal breath sounds bilaterally, no wheezing, rales,rhonchi or crepitation. No use of accessory muscles of respiration.  CARDIOVASCULAR: S1, S2 normal. No murmurs, rubs, or gallops.  ABDOMEN: Soft, nontender, nondistended. Bowel sounds present. No organomegaly or mass.  EXTREMITIES: No pedal edema, cyanosis, or clubbing.  NEUROLOGIC: Cranial nerves II through XII are intact. Muscle strength 5/5 in all extremities. Sensation intact. Gait not checked.  PSYCHIATRIC: The patient is alert and oriented x 3.  SKIN: No obvious rash, lesion, or ulcer.   LABORATORY PANEL:   CBC  Recent Labs Lab 12/22/16 1121 12/24/16 0008  WBC 5.9 8.6  HGB 15.5 15.7  HCT 45.9 46.9  PLT 166 174  MCV 89.6 90.1  MCH 30.2 30.2  MCHC 33.8 33.6  RDW 14.8* 15.3*  LYMPHSABS  --  2.1  MONOABS  --  0.6  EOSABS  --  0.4  BASOSABS  --  0.1    ------------------------------------------------------------------------------------------------------------------  Chemistries   Recent Labs Lab 12/22/16 1121 12/24/16 0008  NA 139 137  K 4.5 4.1  CL 108 105  CO2 25 24  GLUCOSE 86 107*  BUN 20 24*  CREATININE 1.27* 1.35*  CALCIUM 8.6* 9.2  AST  --  17  ALT  --  10*  ALKPHOS  --  83  BILITOT  --  0.8   ------------------------------------------------------------------------------------------------------------------ estimated creatinine clearance is 47.3 mL/min (A) (by C-G formula based on SCr of 1.35 mg/dL (H)). ------------------------------------------------------------------------------------------------------------------ No results for input(s): TSH, T4TOTAL, T3FREE, THYROIDAB in the last 72 hours.  Invalid input(s): FREET3   Coagulation profile No results for input(s): INR, PROTIME in the last 168 hours. ------------------------------------------------------------------------------------------------------------------- No results for input(s): DDIMER in the last 72 hours. -------------------------------------------------------------------------------------------------------------------  Cardiac Enzymes  Recent Labs Lab 12/22/16 1121 12/22/16 1452 12/24/16 0008  TROPONINI <0.03 <0.03 0.03*   ------------------------------------------------------------------------------------------------------------------ Invalid input(s): POCBNP  ---------------------------------------------------------------------------------------------------------------  Urinalysis    Component Value Date/Time   COLORURINE YELLOW (A) 12/22/2016 1121   APPEARANCEUR CLEAR (A) 12/22/2016 1121   LABSPEC 1.008 12/22/2016 1121   PHURINE 6.0 12/22/2016 1121   GLUCOSEU NEGATIVE 12/22/2016 1121   HGBUR NEGATIVE 12/22/2016 1121   BILIRUBINUR NEGATIVE 12/22/2016 1121   KETONESUR NEGATIVE 12/22/2016 1121   PROTEINUR NEGATIVE 12/22/2016 1121    UROBILINOGEN 1.0 05/24/2013 2141   NITRITE NEGATIVE 12/22/2016 1121   LEUKOCYTESUR NEGATIVE 12/22/2016 1121     RADIOLOGY: Dg Chest 2 View  Result Date: 12/22/2016 CLINICAL DATA:  Near syncopal episode pre seated dive dizziness and nausea. No loss consciousness but the patient was not responding to his wife. History of coronary artery disease and CABG, abdominal aortic aneurysm and intracranial aneurysm. History of PVD with inferior vena caval stent placement. Former smoker. EXAM: CHEST  2 VIEW COMPARISON:  Portable chest x-ray of June 02, 2013 and PA and lateral chest x-ray of May 28, 2013. FINDINGS: The lungs are adequately inflated. The interstitial markings are mildly prominent though stable. There is stable blunting of the left lateral costophrenic angle. There is no alveolar edema. There is no significant pleural effusion. The heart and pulmonary vascularity are normal. There is calcification in the wall of the aortic arch. The sternal wires are intact. The bony thorax exhibits no acute abnormality. IMPRESSION: Chronic bronchitic changes, stable. No pneumonia, CHF, nor other acute cardiopulmonary abnormality. Previous CABG, thoracic aortic atherosclerosis. Electronically Signed   By: Maki  Martinique M.D.   On: 12/22/2016 12:07   Ct Head Wo  Contrast  Result Date: 12/22/2016 CLINICAL DATA:  Syncope. EXAM: CT HEAD WITHOUT CONTRAST TECHNIQUE: Contiguous axial images were obtained from the base of the skull through the vertex without intravenous contrast. COMPARISON:  CT head dated July 15, 2012. FINDINGS: Brain: No evidence of acute infarction, hemorrhage, hydrocephalus, extra-axial collection or mass lesion/mass effect. Age-related cerebral atrophy with compensatory dilatation of the ventricles. Mild periventricular white matter and corona radiata hypodensities favor chronic ischemic microvascular white matter disease. Vascular: Atherosclerotic vascular calcification of the carotid siphons.  No hyperdense vessel. Skull: Normal. Negative for fracture or focal lesion. Sinuses/Orbits: The bilateral paranasal sinuses and mastoid air cells are clear. The orbits are unremarkable. Other: None. IMPRESSION: No acute intracranial abnormality. Electronically Signed   By: Titus Dubin M.D.   On: 12/22/2016 12:18   Mr Jeri Cos And Wo Contrast  Result Date: 12/24/2016 CLINICAL DATA:  76 y/o M; periodic dizziness and loss of dexterity in the hands for 2 days. EXAM: MRI HEAD WITHOUT AND WITH CONTRAST TECHNIQUE: Multiplanar, multiecho pulse sequences of the brain and surrounding structures were obtained without and with intravenous contrast. CONTRAST:  77mL MULTIHANCE GADOBENATE DIMEGLUMINE 529 MG/ML IV SOLN COMPARISON:  12/22/2016 CT of the head.  07/13/2012 MRI of the head. FINDINGS: Brain: No acute infarction, hemorrhage, hydrocephalus, extra-axial collection or mass lesion. A few nonspecific foci of T2 FLAIR hyperintense signal abnormality in subcortical and periventricular white matter is compatible with mild chronic microvascular ischemic changes for age. Mild brain parenchymal volume loss. Small stable chronic white matter infarct within the right posterior frontal periventricular white matter and small chronic lacunar infarcts in the right cerebellar hemisphere. No abnormal enhancement. Vascular: Normal flow voids. Skull and upper cervical spine: Normal marrow signal. Sinuses/Orbits: Negative. Other: None. IMPRESSION: 1. No acute intracranial abnormality or abnormal enhancement of the brain. 2. Mild for age chronic microvascular ischemic changes and mild parenchymal volume loss of the brain. Small chronic lacunar infarcts in right frontal periventricular white matter and right cerebellum. Electronically Signed   By: Kristine Garbe M.D.   On: 12/24/2016 01:35    EKG: Orders placed or performed during the hospital encounter of 12/23/16  . ED EKG  . ED EKG  . EKG 12-Lead  . EKG 12-Lead     IMPRESSION AND PLAN: 76 year old male patient with history of abdominal aortic aneurysm, bilateral renal artery stenosis,congestive heart failure, coronary artery disease, hypertension, history of right leg DVT presented to the emergency room with dizziness and loss of balance. Admitting diagnosis 1. Near syncope 2. Vertigo 3. Abnormal troponin 4. Congestive heart failure 5. Coronary artery disease 6. Hypertension Treatment plan Admit patient to telemetry Cycle troponin Cardiology consultation IV fluid hydration Resume cardiac medications Monitor renal function   All the records are reviewed and case discussed with ED provider. Management plans discussed with the patient, family and they are in agreement.  CODE STATUS:FULL CODE Code Status History    Date Active Date Inactive Code Status Order ID Comments User Context   07/10/2013 10:29 AM 07/11/2013 12:40 PM Full Code [id number]  Wellington Hampshire, MD Inpatient   05/24/2013  6:40 PM 05/29/2013  4:19 PM Full Code [id number]  Eugenie Filler, MD Inpatient   07/11/2011  1:21 PM 07/22/2011  6:08 PM Full Code [id number]  Grace Isaac, MD Inpatient       TOTAL TIME TAKING CARE OF THIS PATIENT: 50 minutes.    Saundra Shelling M.D on 12/24/2016 at 3:19 AM  Between 7am to 6pm - Pager -  [phone]  After 6pm go to [email or web address] - password [email or web address] [facility]  Office  [phone]  CC: Primary care physician; Owens Loffler, MD

## 2016-12-24 NOTE — ED Notes (Signed)
Pt transported to room 243 

## 2016-12-24 NOTE — Progress Notes (Signed)
76 year old male patient with history of abdominal aortic aneurysm, bilateral renal artery stenosis,congestive heart failure, coronary artery disease, hypertension, history of right leg DVT presented to the emergency room with dizziness and loss of balance.  1. Near syncope and orthostatic hypotension, possible due to dehydration. Continue IV fluid support and telemetry monitor. 1. Does not appear to be cardiac per Dr. Johnsie Cancel. 3. Acute renal failure due to dehydration. Continue IV fluid support and follow-up BMP. 4. Congestive diastolic heart failure EF 50%. Stable. 5. Coronary artery disease and PVD. Continue aspirin and Lipitor. 6. Hypertension, hold hypertension medication if low blood pressure.  Discussed with Dr. Johnsie Cancel.  Time spent about 35 minutes

## 2016-12-24 NOTE — ED Notes (Signed)
Patient transported to MRI 

## 2016-12-24 NOTE — Consult Note (Signed)
Cardiology Consultation:   Patient ID: [name]; [id number]; [birth year]   Admit date: 12/23/2016 Date of Consult: 12/24/2016  Primary Care Provider: Owens Loffler, MD Primary Cardiologist: Fletcher Anon Primary Electrophysiologist:  None   Patient Profile:   [name] is a 76 y.o. male with a hx of CAD/PVD who is being seen today for the evaluation of pre syncope and dizziness at the request of Dr Bridgett Larsson.  History of Present Illness:   [name] 76 y.o. with CAD/CABG PVD, HTN with previous intracranial hemorrhage in 2014. DVT with IVC filter and chronic claudication varicose veins. Has had left subclavian stent and moderate known left ICA stenosis. CABG in March 2013 Last 48 hours has had episodes of dizziness. No associated chest pain, palpitations or dyspnea. Always start with right leg cramps. Then gets a rush of sensation over his body with dizziness No nausea vomiting or room spinning Then feels like he loses control of his extremities / arms mostly No headache compliant with meds Spells occur regardless of wether he is standing or sitting and position makes no difference  Past Medical History:  Diagnosis Date  . AAA (abdominal aortic aneurysm) ([place])   . Aortic calcification ([place]) 05/01/2013  . Arthritis    "hands; not dx'd" (12/26/2012)  . Bilateral renal artery stenosis ([place])    a. 06/2013 Angio/PTA: LRA: 95 (5x18 Herculink stent), RRA 60ost, celiac/SMA nl, RCIA patent stents extending into dist Ao, RIIA 70ost, REIA min irregs, LCIA patent stent, LIIA 60-70ost, LEIA min irregs.  . CHF (congestive heart failure) ([place])   . Coronary artery disease   . Hypertension   . Hypothyroidism   . Intraventricular hemorrhage ([place]) 07/12/2012  . Peripheral arterial disease ([place])    a. Previous left lower extremity stenting by Dr. Jamal Collin;  b. 12/2012 s/p bilat ostial common iliac stenting.  . Pneumonia   . Right leg DVT ([place]) 08/13/2012  . Subclavian artery stenosis, left ([place]) 05/2012   Status post stenting of the ostium and self-expanding stent placement to the left axillary artery  . Tobacco abuse    d/c 2012  . Venous insufficiency     Past Surgical History:  Procedure Laterality Date  . ABDOMINAL ANGIOGRAM  07/10/2013   WITH BI-FEMORAL RUNOFF       DR ARIDA  . ABDOMINAL AORTAGRAM N/A 12/26/2012   Procedure: ABDOMINAL Maxcine Ham;  Surgeon: Wellington Hampshire, MD;  Location: [facility] CATH LAB;  Service: Cardiovascular;  Laterality: N/A;  . ABDOMINAL AORTAGRAM N/A 07/10/2013   Procedure: ABDOMINAL Maxcine Ham;  Surgeon: Wellington Hampshire, MD;  Location: [facility] CATH LAB;  Service: Cardiovascular;  Laterality: N/A;  . ANGIOPLASTY / STENTING FEMORAL  05/2012  . ANGIOPLASTY / STENTING ILIAC Bilateral 12/26/2012  . ARCH AORTOGRAM N/A 06/20/2012   Procedure: ARCH AORTOGRAM;  Surgeon: Wellington Hampshire, MD;  Location: [facility];  Service: Cardiovascular;  Laterality: N/A;  . CARDIAC CATHETERIZATION  2/14   [facility]  . CHOLECYSTECTOMY  1990's  . CORONARY ARTERY BYPASS GRAFT  07/11/2011   Procedure: CORONARY ARTERY BYPASS GRAFTING (CABG);  Surgeon: Grace Isaac, MD;  Location: [facility];  Service: Open Heart Surgery;  Laterality: N/A;  Times 3. On Pump. Using right greater saphenous vein and left internal mammary artery.   Marland Kitchen HERNIA REPAIR    . INSERTION OF ILIAC STENT Bilateral 12/26/2012   Procedure: INSERTION OF ILIAC STENT;  Surgeon: Wellington Hampshire, MD;  Location: [facility] CATH LAB;  Service: Cardiovascular;  Laterality: Bilateral;  Bilateral Common  Iliac Artery  . LEFT AND RIGHT HEART CATHETERIZATION WITH CORONARY ANGIOGRAM  07/07/2011   Procedure: LEFT AND RIGHT HEART CATHETERIZATION WITH CORONARY ANGIOGRAM;  Surgeon: Minna Merritts, MD;  Location: [facility] CATH LAB;  Service: Cardiovascular;;  . PERCUTANEOUS STENT INTERVENTION  06/20/2012   Procedure: PERCUTANEOUS STENT INTERVENTION;  Surgeon: Wellington Hampshire, MD;  Location: [facility] LAB;  Service: Cardiovascular;;  . RENAL ARTERY ANGIOPLASTY Left 07/10/2013    DR ARIDA  . SUBCLAVIAN ARTERY STENT  05/2012   "2 stents" (12/26/2012)  . TOOTH EXTRACTION  Spring 2017   mulitple (6)  . VENA CAVA FILTER PLACEMENT  07/2012   Removable     Home Medications:  Prior to Admission medications   Medication Sig Start Date End Date Taking? Authorizing Provider  amLODipine (NORVASC) 5 MG tablet TAKE ONE TABLET BY MOUTH EVERY DAY. 06/21/16  Yes Wellington Hampshire, MD  atorvastatin (LIPITOR) 20 MG tablet TAKE 1 TABLET BY MOUTH DAILY AT 6PM 08/16/16  Yes Wellington Hampshire, MD  carvedilol (COREG) 6.25 MG tablet TAKE 1 TABLET TWICE DAILY WITH MEALS 11/23/16  Yes Copland, Frederico Hamman, MD  levothyroxine (SYNTHROID, LEVOTHROID) 150 MCG tablet TAKE ONE TABLET BY MOUTH EVERY DAY BEFORE BREAKFAST 09/29/16  Yes Copland, Frederico Hamman, MD  aspirin EC 81 MG tablet Start 1 tablet daily a week from 08/01/13 08/01/13   Wellington Hampshire, MD  sildenafil (REVATIO) 20 MG tablet Generic Revatio / Sildanefil 20 mg. 2 - 5 tabs 30 mins prior to intercourse. Patient not taking: Reported on 12/22/2016 09/29/16   Owens Loffler, MD    Inpatient Medications: Scheduled Meds: . aspirin EC  81 mg Oral Daily  . atorvastatin  20 mg Oral q1800  . carvedilol  6.25 mg Oral BID WC  . enoxaparin (LOVENOX) injection  40 mg Subcutaneous Q24H  . levothyroxine  150 mcg Oral QAC breakfast  . sodium chloride flush  3 mL Intravenous Q12H   Continuous Infusions: . sodium chloride 75 mL/hr at 12/24/16 0508   PRN Meds: acetaminophen **OR** acetaminophen, ondansetron **OR** ondansetron (ZOFRAN) IV, senna-docusate  Allergies:    Allergies  Allergen Reactions  . Plavix [Clopidogrel Bisulfate]     Brain hemorrhage prev while on plavix and aspirin    Social History:   Social History   Social History  . Marital status: Married    Spouse name: N/A  . Number of children: 2  . Years of education: N/A   Occupational History  . Retired Retired    Land   Social History Main Topics  . Smoking status:  Former Smoker    Packs/day: 1.00    Years: 50.00    Types: Cigarettes    Quit date: 12/25/2010  . Smokeless tobacco: Never Used  . Alcohol use No  . Drug use: No  . Sexual activity: Not Currently   Other Topics Concern  . Not on file   Social History Narrative   Exercsiing 3 times a week.    Moderate diet control.    O living will, no HCPOA.    Family History:    Family History  Problem Relation Age of Onset  . Alcohol abuse Father   . Cirrhosis Father   . Hypothyroidism Sister      ROS:  Please see the history of present illness.  ROS  All other ROS reviewed and negative.     Physical Exam/Data:   Vitals:   12/24/16 0300 12/24/16 0345 12/24/16 0402 12/24/16 0431  BP:  135/84 (!) 117/91  Pulse: 76 74 82 77  Resp:   18 18  Temp:    (!) 97.5 F (36.4 C)  TempSrc:    Axillary  SpO2: 97% 96% 98% 97%  Weight:    170 lb 4.8 oz (77.2 kg)  Height:    5\' 9"  (1.753 m)    Intake/Output Summary (Last 24 hours) at 12/24/16 1004 Last data filed at 12/24/16 0900  Gross per 24 hour  Intake              120 ml  Output                0 ml  Net              120 ml   Filed Weights   12/23/16 2327 12/24/16 0431  Weight: 165 lb (74.8 kg) 170 lb 4.8 oz (77.2 kg)   Body mass index is 25.15 kg/m.  General:  Chronically ill white male  HEENT: normal Lymph: no adenopathy Neck: no JVD Endocrine:  No thryomegaly Vascular: Lef carotid and subclavian bruit  Cardiac:  normal S1, S2; SEM  Lungs:  clear to auscultation bilaterally, no wheezing, rhonchi or rales  Abd: soft, nontender, no hepatomegaly  Ext: no edema Musculoskeletal:  No deformities, BUE and BLE strength normal and equal Skin: warm and dry  Neuro:  CNs 2-12 intact, no focal abnormalities noted Psych:  Normal affect   EKG:  The EKG was personally reviewed and demonstrates:  SR rate 71 nonspecific ST changes  Telemetry:  Telemetry was personally reviewed and demonstrates:  NSR no arrhythmia  Relevant CV  Studies: 0/8/14 Carotid 48-18% LICA patent left subclavian stent   Laboratory Data:  Chemistry  Recent Labs Lab 12/22/16 1121 12/24/16 0008 12/24/16 0605  NA 139 137 139  K 4.5 4.1 4.8  CL 108 105 107  CO2 25 24 26   GLUCOSE 86 107* 102*  BUN 20 24* 24*  CREATININE 1.27* 1.35* 1.61*  CALCIUM 8.6* 9.2 9.2  GFRNONAA 54* 50* 40*  GFRAA >60 58* 47*  ANIONGAP 6 8 6      Recent Labs Lab 12/24/16 0008  PROT 7.3  ALBUMIN 4.3  AST 17  ALT 10*  ALKPHOS 83  BILITOT 0.8   Hematology  Recent Labs Lab 12/22/16 1121 12/24/16 0008 12/24/16 0605  WBC 5.9 8.6 8.6  RBC 5.12 5.20 5.00  HGB 15.5 15.7 15.0  HCT 45.9 46.9 44.0  MCV 89.6 90.1 87.9  MCH 30.2 30.2 30.0  MCHC 33.8 33.6 34.1  RDW 14.8* 15.3* 14.9*  PLT 166 174 158   Cardiac Enzymes  Recent Labs Lab 12/22/16 1121 12/22/16 1452 12/24/16 0008 12/24/16 0605  TROPONINI <0.03 <0.03 0.03* <0.03   No results for input(s): TROPIPOC in the last 168 hours.  BNPNo results for input(s): BNP, PROBNP in the last 168 hours.  DDimer No results for input(s): DDIMER in the last 168 hours.  Radiology/Studies:  Dg Chest 2 View  Result Date: 12/22/2016 CLINICAL DATA:  Near syncopal episode pre seated dive dizziness and nausea. No loss consciousness but the patient was not responding to his wife. History of coronary artery disease and CABG, abdominal aortic aneurysm and intracranial aneurysm. History of PVD with inferior vena caval stent placement. Former smoker. EXAM: CHEST  2 VIEW COMPARISON:  Portable chest x-ray of June 02, 2013 and PA and lateral chest x-ray of May 28, 2013. FINDINGS: The lungs are adequately inflated. The interstitial markings are mildly prominent though  stable. There is stable blunting of the left lateral costophrenic angle. There is no alveolar edema. There is no significant pleural effusion. The heart and pulmonary vascularity are normal. There is calcification in the wall of the aortic arch. The  sternal wires are intact. The bony thorax exhibits no acute abnormality. IMPRESSION: Chronic bronchitic changes, stable. No pneumonia, CHF, nor other acute cardiopulmonary abnormality. Previous CABG, thoracic aortic atherosclerosis. Electronically Signed   By: Almus  Martinique M.D.   On: 12/22/2016 12:07   Ct Head Wo Contrast  Result Date: 12/22/2016 CLINICAL DATA:  Syncope. EXAM: CT HEAD WITHOUT CONTRAST TECHNIQUE: Contiguous axial images were obtained from the base of the skull through the vertex without intravenous contrast. COMPARISON:  CT head dated July 15, 2012. FINDINGS: Brain: No evidence of acute infarction, hemorrhage, hydrocephalus, extra-axial collection or mass lesion/mass effect. Age-related cerebral atrophy with compensatory dilatation of the ventricles. Mild periventricular white matter and corona radiata hypodensities favor chronic ischemic microvascular white matter disease. Vascular: Atherosclerotic vascular calcification of the carotid siphons. No hyperdense vessel. Skull: Normal. Negative for fracture or focal lesion. Sinuses/Orbits: The bilateral paranasal sinuses and mastoid air cells are clear. The orbits are unremarkable. Other: None. IMPRESSION: No acute intracranial abnormality. Electronically Signed   By: Titus Dubin M.D.   On: 12/22/2016 12:18   Mr Jeri Cos And Wo Contrast  Result Date: 12/24/2016 CLINICAL DATA:  76 y/o M; periodic dizziness and loss of dexterity in the hands for 2 days. EXAM: MRI HEAD WITHOUT AND WITH CONTRAST TECHNIQUE: Multiplanar, multiecho pulse sequences of the brain and surrounding structures were obtained without and with intravenous contrast. CONTRAST:  79mL MULTIHANCE GADOBENATE DIMEGLUMINE 529 MG/ML IV SOLN COMPARISON:  12/22/2016 CT of the head.  07/13/2012 MRI of the head. FINDINGS: Brain: No acute infarction, hemorrhage, hydrocephalus, extra-axial collection or mass lesion. A few nonspecific foci of T2 FLAIR hyperintense signal abnormality in  subcortical and periventricular white matter is compatible with mild chronic microvascular ischemic changes for age. Mild brain parenchymal volume loss. Small stable chronic white matter infarct within the right posterior frontal periventricular white matter and small chronic lacunar infarcts in the right cerebellar hemisphere. No abnormal enhancement. Vascular: Normal flow voids. Skull and upper cervical spine: Normal marrow signal. Sinuses/Orbits: Negative. Other: None. IMPRESSION: 1. No acute intracranial abnormality or abnormal enhancement of the brain. 2. Mild for age chronic microvascular ischemic changes and mild parenchymal volume loss of the brain. Small chronic lacunar infarcts in right frontal periventricular white matter and right cerebellum. Electronically Signed   By: Kristine Garbe M.D.   On: 12/24/2016 01:35    Assessment and Plan:   1. Dizziness:  Does not appear to be cardiac Hydrate check postural signs would take BP in right arm as he has left subclavian dx. Will update echo No obvious ischemic event or arrhythmia May be a candidate for ILR as outpatient Azotemic on lab work 2. CAD/CABG no chest pain troponin negative no changes on ECG stable 3. Carotid recent duplex 85-02% LICA and patent left subclavian stent PVD: would be nice to relieve LE claudication as it appears to trigger vagal event. Not a candidate for pletal given CAD. ABI's .5-.60 range f/u Enis Gash, Jenkins Rouge, MD  12/24/2016 10:04 AM

## 2016-12-24 NOTE — ED Notes (Signed)
Lab called to report critical lab troponin value of 0.03, value given to DR. Sung.

## 2016-12-25 ENCOUNTER — Ambulatory Visit: Payer: Medicare Other

## 2016-12-25 ENCOUNTER — Observation Stay: Admit: 2016-12-25 | Payer: Medicare Other

## 2016-12-25 DIAGNOSIS — I951 Orthostatic hypotension: Secondary | ICD-10-CM | POA: Diagnosis not present

## 2016-12-25 DIAGNOSIS — N179 Acute kidney failure, unspecified: Secondary | ICD-10-CM | POA: Diagnosis not present

## 2016-12-25 DIAGNOSIS — I2510 Atherosclerotic heart disease of native coronary artery without angina pectoris: Secondary | ICD-10-CM | POA: Diagnosis not present

## 2016-12-25 DIAGNOSIS — R55 Syncope and collapse: Secondary | ICD-10-CM | POA: Diagnosis not present

## 2016-12-25 LAB — BASIC METABOLIC PANEL
Anion gap: 6 (ref 5–15)
BUN: 26 mg/dL — ABNORMAL HIGH (ref 6–20)
CHLORIDE: 110 mmol/L (ref 101–111)
CO2: 23 mmol/L (ref 22–32)
Calcium: 8.5 mg/dL — ABNORMAL LOW (ref 8.9–10.3)
Creatinine, Ser: 1.25 mg/dL — ABNORMAL HIGH (ref 0.61–1.24)
GFR, EST NON AFRICAN AMERICAN: 55 mL/min — AB (ref >60)
Glucose, Bld: 85 mg/dL (ref 65–99)
POTASSIUM: 3.9 mmol/L (ref 3.5–5.1)
SODIUM: 139 mmol/L (ref 135–145)

## 2016-12-25 LAB — GLUCOSE, CAPILLARY: Glucose-Capillary: 81 mg/dL (ref 65–99)

## 2016-12-25 NOTE — Discharge Summary (Signed)
[facility] NAME: [name]    MR#:  [id number]  DATE OF BIRTH:  1941/04/15  DATE OF ADMISSION:  12/23/2016   ADMITTING PHYSICIAN: Saundra Shelling, MD  DATE OF DISCHARGE: 12/25/2016 11:14 AM  PRIMARY CARE PHYSICIAN: Owens Loffler, MD   ADMISSION DIAGNOSIS:  Dizziness [R42] Elevated troponin [R74.8] DISCHARGE DIAGNOSIS:  Active Problems:   Syncope  SECONDARY DIAGNOSIS:   Past Medical History:  Diagnosis Date  . AAA (abdominal aortic aneurysm) ([place])   . Aortic calcification ([place]) 05/01/2013  . Arthritis    "hands; not dx'd" (12/26/2012)  . Bilateral renal artery stenosis ([place])    a. 06/2013 Angio/PTA: LRA: 95 (5x18 Herculink stent), RRA 60ost, celiac/SMA nl, RCIA patent stents extending into dist Ao, RIIA 70ost, REIA min irregs, LCIA patent stent, LIIA 60-70ost, LEIA min irregs.  . CHF (congestive heart failure) ([place])   . Coronary artery disease   . Hypertension   . Hypothyroidism   . Intraventricular hemorrhage ([place]) 07/12/2012  . Peripheral arterial disease ([place])    a. Previous left lower extremity stenting by Dr. Jamal Collin;  b. 12/2012 s/p bilat ostial common iliac stenting.  . Pneumonia   . Right leg DVT ([place]) 08/13/2012  . Subclavian artery stenosis, left ([place]) 05/2012   Status post stenting of the ostium and self-expanding stent placement to the left axillary artery  . Tobacco abuse    d/c 2012  . Venous insufficiency    HOSPITAL COURSE:   76 year old male patient with history of abdominal aortic aneurysm, bilateral renal artery stenosis,congestive heart failure, coronary artery disease, hypertension, history of right leg DVT presented to the emergency room with dizziness and loss of balance.  1. Near syncope and orthostatic hypotension, possible due to dehydration.. Does not appear to be cardiac per Dr. Johnsie Cancel. 2. Acute renal failure due to dehydration.Improved with IV fluid support 3. Congestive diastolic heart  failure EF 50%. Stable. 4. Coronary artery disease and PVD. Continue aspirin and Lipitor. 5. Hypertension, resume hypertension medication. DISCHARGE CONDITIONS:  Stable, discharged to home today. CONSULTS OBTAINED:  Treatment Team:  Josue Hector, MD DRUG ALLERGIES:   Allergies  Allergen Reactions  . Plavix [Clopidogrel Bisulfate]     Brain hemorrhage prev while on plavix and aspirin   DISCHARGE MEDICATIONS:   Allergies as of 12/25/2016      Reactions   Plavix [clopidogrel Bisulfate]    Brain hemorrhage prev while on plavix and aspirin      Medication List    TAKE these medications   amLODipine 5 MG tablet Commonly known as:  NORVASC TAKE ONE TABLET BY MOUTH EVERY DAY.   aspirin EC 81 MG tablet Start 1 tablet daily a week from 08/01/13   atorvastatin 20 MG tablet Commonly known as:  LIPITOR TAKE 1 TABLET BY MOUTH DAILY AT 6PM   carvedilol 6.25 MG tablet Commonly known as:  COREG TAKE 1 TABLET TWICE DAILY WITH MEALS   levothyroxine 150 MCG tablet Commonly known as:  SYNTHROID, LEVOTHROID TAKE ONE TABLET BY MOUTH EVERY DAY BEFORE BREAKFAST            Discharge Care Instructions        Start     Ordered   12/25/16 0000  Increase activity slowly     12/25/16 0929   12/25/16 0000  Diet - low sodium heart healthy     12/25/16 0929       DISCHARGE INSTRUCTIONS:  See AVS.  If  you experience worsening of your admission symptoms, develop shortness of breath, life threatening emergency, suicidal or homicidal thoughts you must seek medical attention immediately by calling 911 or calling your MD immediately  if symptoms less severe.  You Must read complete instructions/literature along with all the possible adverse reactions/side effects for all the Medicines you take and that have been prescribed to you. Take any new Medicines after you have completely understood and accpet all the possible adverse reactions/side effects.   Please note  You were cared for by  a hospitalist during your hospital stay. If you have any questions about your discharge medications or the care you received while you were in the hospital after you are discharged, you can call the unit and asked to speak with the hospitalist on call if the hospitalist that took care of you is not available. Once you are discharged, your primary care physician will handle any further medical issues. Please note that NO REFILLS for any discharge medications will be authorized once you are discharged, as it is imperative that you return to your primary care physician (or establish a relationship with a primary care physician if you do not have one) for your aftercare needs so that they can reassess your need for medications and monitor your lab values.    On the day of Discharge:  VITAL SIGNS:  Blood pressure 123/61, pulse 80, temperature 97.7 F (36.5 C), temperature source Oral, resp. rate 16, height 5\' 9"  (1.753 m), weight 173 lb 9.6 oz (78.7 kg), SpO2 99 %. PHYSICAL EXAMINATION:  GENERAL:  76 y.o.-year-old patient lying in the bed with no acute distress.  EYES: Pupils equal, round, reactive to light and accommodation. No scleral icterus. Extraocular muscles intact.  HEENT: Head atraumatic, normocephalic. Oropharynx and nasopharynx clear.  NECK:  Supple, no jugular venous distention. No thyroid enlargement, no tenderness.  LUNGS: Normal breath sounds bilaterally, no wheezing, rales,rhonchi or crepitation. No use of accessory muscles of respiration.  CARDIOVASCULAR: S1, S2 normal. No murmurs, rubs, or gallops.  ABDOMEN: Soft, non-tender, non-distended. Bowel sounds present. No organomegaly or mass.  EXTREMITIES: No pedal edema, cyanosis, or clubbing.  NEUROLOGIC: Cranial nerves II through XII are intact. Muscle strength 5/5 in all extremities. Sensation intact. Gait not checked.  PSYCHIATRIC: The patient is alert and oriented x 3.  SKIN: No obvious rash, lesion, or ulcer.  DATA REVIEW:    CBC  Recent Labs Lab 12/24/16 0605  WBC 8.6  HGB 15.0  HCT 44.0  PLT 158    Chemistries   Recent Labs Lab 12/24/16 0008  12/25/16 0541  NA 137  < > 139  K 4.1  < > 3.9  CL 105  < > 110  CO2 24  < > 23  GLUCOSE 107*  < > 85  BUN 24*  < > 26*  CREATININE 1.35*  < > 1.25*  CALCIUM 9.2  < > 8.5*  AST 17  --   --   ALT 10*  --   --   ALKPHOS 83  --   --   BILITOT 0.8  --   --   < > = values in this interval not displayed.   Microbiology Results  Results for orders placed or performed in visit on 06/02/13  Culture, blood (single)     Status: None   Collection Time: 06/02/13  9:45 AM  Result Value Ref Range Status   Micro Text Report   Final       SOURCE: #  1 LFA    COMMENT                   NO GROWTH AEROBICALLY/ANAEROBICALLY IN 5 DAYS   ANTIBIOTIC                                                      Culture, blood (single)     Status: None   Collection Time: 06/02/13  9:59 AM  Result Value Ref Range Status   Micro Text Report   Final       SOURCE: #2RW    COMMENT                   NO GROWTH AEROBICALLY/ANAEROBICALLY IN 5 DAYS   ANTIBIOTIC                                                        RADIOLOGY:  No results found.   Management plans discussed with the patient, family and they are in agreement.  CODE STATUS: Prior   TOTAL TIME TAKING CARE OF THIS PATIENT: 27 minutes.    Demetrios Loll M.D on 12/25/2016 at 2:39 PM  Between 7am to 6pm - Pager - [phone]  After 6pm go to [email or web address] - Proofreader  [facility]  Office  [phone]  CC: Primary care physician; Owens Loffler, MD   Note: This dictation was prepared with Dragon dictation along with smaller phrase technology. Any transcriptional errors that result from this process are unintentional.

## 2016-12-25 NOTE — Discharge Instructions (Signed)
Heart healthy diet. °Fall precaution. °

## 2016-12-25 NOTE — Progress Notes (Signed)
Pt to be discharged today. Iv and tele removed. disch instructions given to pt and wife to their understanding. disch via w.c. Accompanied by wife.

## 2016-12-27 ENCOUNTER — Telehealth: Payer: Self-pay | Admitting: Cardiovascular Disease

## 2016-12-27 ENCOUNTER — Telehealth: Payer: Self-pay

## 2016-12-27 ENCOUNTER — Ambulatory Visit: Payer: Medicare Other

## 2016-12-27 DIAGNOSIS — M7989 Other specified soft tissue disorders: Secondary | ICD-10-CM

## 2016-12-27 NOTE — Telephone Encounter (Signed)
Per verbal from Dr. Fletcher Anon, pt may have LE venous to r/o DVT. Scheduled today at the [facility], 4:00pm S/w wife who states pt can not make this appt as neither of them drive. Prefers tomorrow. Rescheduled 9/5, 3:15pm arrival, [facility]. Pt's wife agreeable.

## 2016-12-27 NOTE — Telephone Encounter (Signed)
Transition Care Management Follow-up Telephone Call   Date discharged? 12/25/16   How have you been since you were released from the hospital? "I feel terrible"   Do you understand why you were in the hospital? yes, "they said dehydration but I think I have a DVT"   Do you understand the discharge instructions? yes   Where were you discharged to? Home. Lives with wife.    Items Reviewed:  Medications reviewed: yes  Allergies reviewed: yes  Dietary changes reviewed: yes  Referrals reviewed: yes, f/u with Cardiology 12/29/16   Functional Questionnaire:   Activities of Daily Living (ADLs):   He states they are independent in the following: feeding, continence, grooming and toileting States they require assistance with the following: ambulation, bathing and hygiene and dressing   Any transportation issues/concerns?: yes. Pt unable to drive d/t lower extremity edema.    Any patient concerns? yes, Pt reports right leg edema up to waist inhibiting ability to walk or stand for long distances. Pt states he was discharged in this condition. Pt scheduled for dopplers on 12/28/16 and f/u with cardiology on 12/29/16.    Confirmed importance and date/time of follow-up visits scheduled yes  Provider Appointment booked with PCP on 01/09/17. F/U with cardiology on 12/29/16.   Confirmed with patient if condition begins to worsen call PCP or go to the ER.  Patient was given the office number and encouraged to call back with question or concerns.  : yes

## 2016-12-27 NOTE — Telephone Encounter (Signed)
Awaiting MD signature 

## 2016-12-27 NOTE — Telephone Encounter (Signed)
Pt recently admitted to [facility] for syncope. Needs f/u this week. S/w wife who reports continued right leg cramping just below the knee.  Calf and top of his foot is slightly swollen and "hard as a rock". Pt states leg has good color, no warmth or redness.  He notified nurses during admission; no testing ordered. He has a hx of right leg DVT; filter in place. Calf slightly swollen and "hard as a rock". Pt and wife are agreeable to 9/6 appt w/Dr. Fletcher Anon. I will make him aware today of right leg issues.

## 2016-12-27 NOTE — Telephone Encounter (Signed)
Patient cancelled [place] LEvenous and r/s for tomorrow .   This order needs to be signed prior to appt in the morning.

## 2016-12-28 ENCOUNTER — Ambulatory Visit
Admission: RE | Admit: 2016-12-28 | Discharge: 2016-12-28 | Disposition: A | Discharge status: Home / Self Care | Payer: Medicare Other | Source: Ambulatory Visit | Attending: Cardiovascular Disease | Admitting: Cardiovascular Disease

## 2016-12-28 ENCOUNTER — Telehealth: Payer: Self-pay | Admitting: Cardiovascular Disease

## 2016-12-28 ENCOUNTER — Other Ambulatory Visit: Payer: Self-pay | Admitting: Cardiovascular Disease

## 2016-12-28 ENCOUNTER — Other Ambulatory Visit: Payer: Self-pay

## 2016-12-28 DIAGNOSIS — M7989 Other specified soft tissue disorders: Secondary | ICD-10-CM

## 2016-12-28 DIAGNOSIS — I824Z1 Acute embolism and thrombosis of unspecified deep veins of right distal lower extremity: Secondary | ICD-10-CM | POA: Diagnosis not present

## 2016-12-28 DIAGNOSIS — I82432 Acute embolism and thrombosis of left popliteal vein: Secondary | ICD-10-CM | POA: Insufficient documentation

## 2016-12-28 DIAGNOSIS — I82411 Acute embolism and thrombosis of right femoral vein: Secondary | ICD-10-CM

## 2016-12-28 DIAGNOSIS — I82890 Acute embolism and thrombosis of other specified veins: Secondary | ICD-10-CM | POA: Insufficient documentation

## 2016-12-28 MED ORDER — APIXABAN 5 MG PO TABS: ORAL_TABLET | ORAL | 0 refills | Status: DC

## 2016-12-28 NOTE — Telephone Encounter (Signed)
S/w Dr. Fletcher Anon who ordered eliquis 10mg  BID x 1 week then reduced to normal dose 5mg  BID thereafter as pt has DVTs. He would like pt to start eliqus today. I have left a detailed message on pt's home VM. Will await call back.

## 2016-12-28 NOTE — Telephone Encounter (Signed)
Pt wife called back, states pt did have another episode of cold sweats, dizziness, heavy breathing, a lot of burping. Please call.

## 2016-12-28 NOTE — Telephone Encounter (Signed)
Pt had bilateral LE Venous US today. Dr. Rockey Situ s/w Dr. Maudie Mercury regarding results.  Dr. Rockey Situ agreeable that pt may go home and f/u w/Dr. Fletcher Anon as planned. Left voice mail message to confirm tomorrow's appt. Provided call back number if questions or concerns.

## 2016-12-28 NOTE — Telephone Encounter (Signed)
Pt's wife returned call. Reviewed Eliquis instructions.  Wife verbalized understanding to have pt stop aspirin and take Eliquis 10mg  BID x 1 week thereafter, 5mg  BID. Prescription sent to [facility], [address] per pt request. Will provide written instructions, samples, and 30-day free card at office visit tomorrow. Wife agreeable with plan.   Confirmed receipt of prescription w/pharmacy.

## 2016-12-28 NOTE — Telephone Encounter (Signed)
Pt's wife reports "same type of episode that took [place] to the ER" continues. Pt will experience right leg cramping followed by cold sweats, dizziness and heavy breathing.  Typically only happens when pt is ambulating. He has been "staying quiet" since his hospitalization.  Wife thinks BP drops during the episodes but is unable to check w/cuff.  He has a known blood clot in lower right leg and filter in groin. Pain and swelling has moved into the thigh and groin area.  Wife feels that leg concerns were not addressed in the hospital.  He has 3:30pm appt today for US venous right extremity. She understands to monitor sx and return to ED if sx worsen. She will await results of ultrasound and keep appt tomorrow with Dr. Fletcher Anon.

## 2016-12-28 NOTE — Telephone Encounter (Signed)
Pt wife states pt has swelling in upper groin area and lower leg also. Please call.   Pt c/o swelling: STAT is pt has developed SOB within 24 hours  1. How long have you been experiencing swelling? Since Sunday  2. Where is the swelling located? Groin area and lower leg. Gradually moving up leg  3.  Are you currently taking a "fluid pill"? no  4.  Are you currently SOB? Yes, but just got done "washing up"  5.  Have you traveled recently? no

## 2016-12-29 ENCOUNTER — Inpatient Hospital Stay: Payer: Medicare Other

## 2016-12-29 ENCOUNTER — Encounter: Payer: Self-pay | Admitting: Emergency Medicine

## 2016-12-29 ENCOUNTER — Encounter: Payer: Self-pay | Admitting: Cardiovascular Disease

## 2016-12-29 ENCOUNTER — Inpatient Hospital Stay
Admission: EM | Admit: 2016-12-29 | Discharge: 2016-12-31 | DRG: 271 | Disposition: A | Discharge status: Home / Self Care | Payer: Medicare Other | Attending: Specialist | Admitting: Specialist

## 2016-12-29 ENCOUNTER — Ambulatory Visit (INDEPENDENT_AMBULATORY_CARE_PROVIDER_SITE_OTHER): Payer: Medicare Other | Admitting: Cardiovascular Disease

## 2016-12-29 VITALS — BP 108/59 | HR 85 | Ht 69.0 in | Wt 176.0 lb

## 2016-12-29 DIAGNOSIS — I130 Hypertensive heart and chronic kidney disease with heart failure and stage 1 through stage 4 chronic kidney disease, or unspecified chronic kidney disease: Secondary | ICD-10-CM | POA: Diagnosis present

## 2016-12-29 DIAGNOSIS — I2510 Atherosclerotic heart disease of native coronary artery without angina pectoris: Secondary | ICD-10-CM | POA: Diagnosis present

## 2016-12-29 DIAGNOSIS — Z951 Presence of aortocoronary bypass graft: Secondary | ICD-10-CM | POA: Diagnosis not present

## 2016-12-29 DIAGNOSIS — I82220 Acute embolism and thrombosis of inferior vena cava: Secondary | ICD-10-CM | POA: Diagnosis not present

## 2016-12-29 DIAGNOSIS — Z79899 Other long term (current) drug therapy: Secondary | ICD-10-CM | POA: Diagnosis not present

## 2016-12-29 DIAGNOSIS — J449 Chronic obstructive pulmonary disease, unspecified: Secondary | ICD-10-CM | POA: Diagnosis present

## 2016-12-29 DIAGNOSIS — Z7901 Long term (current) use of anticoagulants: Secondary | ICD-10-CM

## 2016-12-29 DIAGNOSIS — Z888 Allergy status to other drugs, medicaments and biological substances status: Secondary | ICD-10-CM | POA: Diagnosis not present

## 2016-12-29 DIAGNOSIS — I739 Peripheral vascular disease, unspecified: Secondary | ICD-10-CM | POA: Diagnosis present

## 2016-12-29 DIAGNOSIS — I872 Venous insufficiency (chronic) (peripheral): Secondary | ICD-10-CM | POA: Diagnosis present

## 2016-12-29 DIAGNOSIS — I340 Nonrheumatic mitral (valve) insufficiency: Secondary | ICD-10-CM | POA: Diagnosis not present

## 2016-12-29 DIAGNOSIS — I779 Disorder of arteries and arterioles, unspecified: Secondary | ICD-10-CM | POA: Diagnosis not present

## 2016-12-29 DIAGNOSIS — I82409 Acute embolism and thrombosis of unspecified deep veins of unspecified lower extremity: Secondary | ICD-10-CM | POA: Diagnosis present

## 2016-12-29 DIAGNOSIS — I82423 Acute embolism and thrombosis of iliac vein, bilateral: Secondary | ICD-10-CM | POA: Diagnosis present

## 2016-12-29 DIAGNOSIS — I82413 Acute embolism and thrombosis of femoral vein, bilateral: Principal | ICD-10-CM | POA: Diagnosis present

## 2016-12-29 DIAGNOSIS — I10 Essential (primary) hypertension: Secondary | ICD-10-CM

## 2016-12-29 DIAGNOSIS — E785 Hyperlipidemia, unspecified: Secondary | ICD-10-CM | POA: Diagnosis present

## 2016-12-29 DIAGNOSIS — R0602 Shortness of breath: Secondary | ICD-10-CM | POA: Diagnosis not present

## 2016-12-29 DIAGNOSIS — R609 Edema, unspecified: Secondary | ICD-10-CM | POA: Diagnosis not present

## 2016-12-29 DIAGNOSIS — O2230 Deep phlebothrombosis in pregnancy, unspecified trimester: Secondary | ICD-10-CM | POA: Diagnosis present

## 2016-12-29 DIAGNOSIS — N183 Chronic kidney disease, stage 3 (moderate): Secondary | ICD-10-CM | POA: Diagnosis present

## 2016-12-29 DIAGNOSIS — I82493 Acute embolism and thrombosis of other specified deep vein of lower extremity, bilateral: Secondary | ICD-10-CM | POA: Diagnosis not present

## 2016-12-29 DIAGNOSIS — Z8673 Personal history of transient ischemic attack (TIA), and cerebral infarction without residual deficits: Secondary | ICD-10-CM | POA: Diagnosis not present

## 2016-12-29 DIAGNOSIS — Z87891 Personal history of nicotine dependence: Secondary | ICD-10-CM | POA: Diagnosis not present

## 2016-12-29 DIAGNOSIS — I714 Abdominal aortic aneurysm, without rupture: Secondary | ICD-10-CM | POA: Diagnosis present

## 2016-12-29 DIAGNOSIS — I509 Heart failure, unspecified: Secondary | ICD-10-CM | POA: Diagnosis not present

## 2016-12-29 DIAGNOSIS — E039 Hypothyroidism, unspecified: Secondary | ICD-10-CM | POA: Diagnosis present

## 2016-12-29 DIAGNOSIS — Z95828 Presence of other vascular implants and grafts: Secondary | ICD-10-CM | POA: Diagnosis not present

## 2016-12-29 DIAGNOSIS — I82401 Acute embolism and thrombosis of unspecified deep veins of right lower extremity: Secondary | ICD-10-CM | POA: Diagnosis not present

## 2016-12-29 DIAGNOSIS — I82402 Acute embolism and thrombosis of unspecified deep veins of left lower extremity: Secondary | ICD-10-CM | POA: Diagnosis not present

## 2016-12-29 LAB — CBC WITH DIFFERENTIAL/PLATELET
BASOS ABS: 0.1 10*3/uL (ref 0–0.1)
BASOS PCT: 1 %
EOS PCT: 0 %
Eosinophils Absolute: 0 10*3/uL (ref 0–0.7)
HEMATOCRIT: 44.8 % (ref 40.0–52.0)
Hemoglobin: 15 g/dL (ref 13.0–18.0)
LYMPHS PCT: 15 %
Lymphs Abs: 1.7 10*3/uL (ref 1.0–3.6)
MCH: 29.5 pg (ref 26.0–34.0)
MCHC: 33.6 g/dL (ref 32.0–36.0)
MCV: 87.9 fL (ref 80.0–100.0)
Monocytes Absolute: 1.1 10*3/uL — ABNORMAL HIGH (ref 0.2–1.0)
Monocytes Relative: 10 %
NEUTROS ABS: 8.5 10*3/uL — AB (ref 1.4–6.5)
Neutrophils Relative %: 74 %
PLATELETS: 196 10*3/uL (ref 150–440)
RBC: 5.1 MIL/uL (ref 4.40–5.90)
RDW: 14.9 % — ABNORMAL HIGH (ref 11.5–14.5)
WBC: 11.5 10*3/uL — AB (ref 3.8–10.6)

## 2016-12-29 LAB — COMPREHENSIVE METABOLIC PANEL
ALBUMIN: 4 g/dL (ref 3.5–5.0)
ALT: 12 U/L — AB (ref 17–63)
AST: 20 U/L (ref 15–41)
Alkaline Phosphatase: 82 U/L (ref 38–126)
Anion gap: 11 (ref 5–15)
BUN: 26 mg/dL — AB (ref 6–20)
CO2: 24 mmol/L (ref 22–32)
CREATININE: 1.36 mg/dL — AB (ref 0.61–1.24)
Calcium: 9.5 mg/dL (ref 8.9–10.3)
Chloride: 101 mmol/L (ref 101–111)
GFR calc Af Amer: 57 mL/min — ABNORMAL LOW (ref >60)
GFR, EST NON AFRICAN AMERICAN: 49 mL/min — AB (ref >60)
GLUCOSE: 118 mg/dL — AB (ref 65–99)
POTASSIUM: 4.8 mmol/L (ref 3.5–5.1)
Sodium: 136 mmol/L (ref 135–145)
Total Bilirubin: 1.5 mg/dL — ABNORMAL HIGH (ref 0.3–1.2)
Total Protein: 7.8 g/dL (ref 6.5–8.1)

## 2016-12-29 LAB — PROTIME-INR
INR: 1.73
PROTHROMBIN TIME: 20.1 s — AB (ref 11.4–15.2)

## 2016-12-29 LAB — HEPARIN LEVEL (UNFRACTIONATED): Heparin Unfractionated: >3.6 IU/mL — ABNORMAL HIGH (ref 0.30–0.70)

## 2016-12-29 LAB — APTT: APTT: 38 s — AB (ref 24–36)

## 2016-12-29 MED ORDER — ALBUTEROL SULFATE (2.5 MG/3ML) 0.083% IN NEBU
2.5000 mg | INHALATION_SOLUTION | RESPIRATORY_TRACT | Status: DC | PRN
Start: 2016-12-29 — End: 2016-12-31

## 2016-12-29 MED ORDER — SODIUM CHLORIDE 0.9% FLUSH
3.0000 mL | Freq: Two times a day (BID) | INTRAVENOUS | Status: DC
  Administered 2016-12-30 (×2): 3 mL via INTRAVENOUS

## 2016-12-29 MED ORDER — HEPARIN (PORCINE) IN NACL 100-0.45 UNIT/ML-% IJ SOLN
1050.0000 [IU]/h | INTRAMUSCULAR | Status: DC
  Administered 2016-12-29: 1200 [IU]/h via INTRAVENOUS
  Administered 2016-12-30: 1050 [IU]/h via INTRAVENOUS
  Filled 2016-12-29 (×3): qty 250

## 2016-12-29 MED ORDER — TRAMADOL HCL 50 MG PO TABS
50.0000 mg | ORAL_TABLET | Freq: Four times a day (QID) | ORAL | Status: DC | PRN
  Administered 2016-12-31 (×2): 50 mg via ORAL
  Filled 2016-12-29 (×2): qty 1

## 2016-12-29 MED ORDER — ONDANSETRON HCL 4 MG/2ML IJ SOLN: 4.0000 mg | Freq: Four times a day (QID) | INTRAMUSCULAR | Status: DC | PRN

## 2016-12-29 MED ORDER — ACETAMINOPHEN 325 MG PO TABS: 650.0000 mg | ORAL_TABLET | Freq: Four times a day (QID) | ORAL | Status: DC | PRN

## 2016-12-29 MED ORDER — ONDANSETRON HCL 4 MG PO TABS: 4.0000 mg | ORAL_TABLET | Freq: Four times a day (QID) | ORAL | Status: DC | PRN

## 2016-12-29 MED ORDER — POLYETHYLENE GLYCOL 3350 17 G PO PACK: 17.0000 g | PACK | Freq: Every day | ORAL | Status: DC | PRN

## 2016-12-29 MED ORDER — ACETAMINOPHEN 650 MG RE SUPP: 650.0000 mg | Freq: Four times a day (QID) | RECTAL | Status: DC | PRN

## 2016-12-29 NOTE — Progress Notes (Signed)
Cardiology Office Note   Date:  12/29/2016   ID:  [name], DOB 10/30/1940, MRN [id number]  PCP:  Owens Loffler, MD  Cardiologist:   [name], MD   Chief Complaint  Patient presents with  . Other    F/u ED due to dizziness/syncope c/o weight gain, swelling/pain of the pelvic area  and edema ankles. Meds reviewed verbally with pt.      History of Present Illness: [name] is a 76 y.o. male who presents for a followup visit regarding extensive peripheral arterial disease with previous stenting of the left subclavian artery and left axillary artery, bilateral common iliac artery stenting and  left renal artery stenting. The patient has multiple medical problems including hyperlipidemia, Small AAA, long smoking history for 60 years, COPD , moderate carotid disease, and atherosclerotic coronary artery disease status post CABG in March of 2013 with LIMA to the LAD, vein graft to the PDA, vein graft to the OM. He does have mild aortic valve stenosis.  He had a small intracranial hemorrhage in 2014 which was suspected to be due to malignant hypertension and dual antiplatelet therapy. Shortly after that he was diagnosed with extensive right-sided DVT. Anticoagulation was contraindicated and thus an IVC filter was placed.  He was hospitalized recently at [facility] with near syncope and orthostatic hypotension that improved with hydration. He was also felt to have vagal response related to severe right leg pain.   he continued to have leg pain and swelling after hospital discharge and thus I sent him for lower extremity venous duplex yesterday which showed extensive occlusive thrombus within the right common femoral and femoral veins with extension into the saphenous femoral junction. He was also noted to have occlusive thrombus in the left common femoral vein, femoral vein and popliteal vein.  The patient reports that the swelling in his leg started last week on Thursday and has been  associated with significant pain and swelling. He reports weight gain gradually. He also reports shortness of breath. Every time he gets the pain in his legs, he becomes dizzy and lightheaded.  Past Medical History:  Diagnosis Date  . AAA (abdominal aortic aneurysm) ([place])   . Aortic calcification ([place]) 05/01/2013  . Arthritis    "hands; not dx'd" (12/26/2012)  . Bilateral renal artery stenosis ([place])    a. 06/2013 Angio/PTA: LRA: 95 (5x18 Herculink stent), RRA 60ost, celiac/SMA nl, RCIA patent stents extending into dist Ao, RIIA 70ost, REIA min irregs, LCIA patent stent, LIIA 60-70ost, LEIA min irregs.  . CHF (congestive heart failure) ([place])   . Coronary artery disease   . Hypertension   . Hypothyroidism   . Intraventricular hemorrhage ([place]) 07/12/2012  . Peripheral arterial disease ([place])    a. Previous left lower extremity stenting by Dr. Jamal Collin;  b. 12/2012 s/p bilat ostial common iliac stenting.  . Pneumonia   . Right leg DVT ([place]) 08/13/2012  . Subclavian artery stenosis, left ([place]) 05/2012   Status post stenting of the ostium and self-expanding stent placement to the left axillary artery  . Tobacco abuse    d/c 2012  . Venous insufficiency     Past Surgical History:  Procedure Laterality Date  . ABDOMINAL ANGIOGRAM  07/10/2013   WITH BI-FEMORAL RUNOFF       DR Kc Sedlak  . ABDOMINAL AORTAGRAM N/A 12/26/2012   Procedure: ABDOMINAL Maxcine Ham;  Surgeon: Wellington Hampshire, MD;  Location: [place] CATH LAB;  Service: Cardiovascular;  Laterality: N/A;  . ABDOMINAL  AORTAGRAM N/A 07/10/2013   Procedure: ABDOMINAL Maxcine Ham;  Surgeon: Wellington Hampshire, MD;  Location: [place] CATH LAB;  Service: Cardiovascular;  Laterality: N/A;  . ANGIOPLASTY / STENTING FEMORAL  05/2012  . ANGIOPLASTY / STENTING ILIAC Bilateral 12/26/2012  . ARCH AORTOGRAM N/A 06/20/2012   Procedure: ARCH AORTOGRAM;  Surgeon: Wellington Hampshire, MD;  Location: [place] CATH LAB;  Service: Cardiovascular;  Laterality: N/A;  . CARDIAC CATHETERIZATION  2/14     [facility]  . CHOLECYSTECTOMY  1990's  . CORONARY ARTERY BYPASS GRAFT  07/11/2011   Procedure: CORONARY ARTERY BYPASS GRAFTING (CABG);  Surgeon: Grace Isaac, MD;  Location: [facility];  Service: Open Heart Surgery;  Laterality: N/A;  Times 3. On Pump. Using right greater saphenous vein and left internal mammary artery.   Marland Kitchen HERNIA REPAIR    . INSERTION OF ILIAC STENT Bilateral 12/26/2012   Procedure: INSERTION OF ILIAC STENT;  Surgeon: Wellington Hampshire, MD;  Location: [facility] CATH LAB;  Service: Cardiovascular;  Laterality: Bilateral;  Bilateral Common Iliac Artery  . LEFT AND RIGHT HEART CATHETERIZATION WITH CORONARY ANGIOGRAM  07/07/2011   Procedure: LEFT AND RIGHT HEART CATHETERIZATION WITH CORONARY ANGIOGRAM;  Surgeon: Minna Merritts, MD;  Location: [place] CATH LAB;  Service: Cardiovascular;;  . PERCUTANEOUS STENT INTERVENTION  06/20/2012   Procedure: PERCUTANEOUS STENT INTERVENTION;  Surgeon: Wellington Hampshire, MD;  Location: [place] CATH LAB;  Service: Cardiovascular;;  . RENAL ARTERY ANGIOPLASTY Left 07/10/2013   DR Sloan Takagi  . SUBCLAVIAN ARTERY STENT  05/2012   "2 stents" (12/26/2012)  . TOOTH EXTRACTION  Spring 2017   mulitple (6)  . VENA CAVA FILTER PLACEMENT  07/2012   Removable     Current Outpatient Prescriptions  Medication Sig Dispense Refill  . amLODipine (NORVASC) 5 MG tablet TAKE ONE TABLET BY MOUTH EVERY DAY. 90 tablet 3  . apixaban (ELIQUIS) 5 MG TABS tablet Take 10mg  twice daily for 7 days then decrease to 5mg  twice daily 60 tablet 0  . atorvastatin (LIPITOR) 20 MG tablet TAKE 1 TABLET BY MOUTH DAILY AT 6PM 30 tablet 3  . carvedilol (COREG) 6.25 MG tablet TAKE 1 TABLET TWICE DAILY WITH MEALS 60 tablet 5  . levothyroxine (SYNTHROID, LEVOTHROID) 150 MCG tablet TAKE ONE TABLET BY MOUTH EVERY DAY BEFORE BREAKFAST 30 tablet 11   No current facility-administered medications for this visit.     Allergies:   Plavix [clopidogrel bisulfate]    Social History:  The patient  reports that he quit  smoking about 6 years ago. His smoking use included Cigarettes. He has a 50.00 pack-year smoking history. He has never used smokeless tobacco. He reports that he does not drink alcohol or use drugs.   Family History:  The patient's family history includes Alcohol abuse in his father; Cirrhosis in his father; Hypothyroidism in his sister.    ROS:  Please see the history of present illness.   Otherwise, review of systems are positive for none.   All other systems are reviewed and negative.    PHYSICAL EXAM: VS:  BP (!) 108/59 (BP Location: Right Arm, Patient Position: Sitting, Cuff Size: Normal)   Pulse 85   Ht 5\' 9"  (1.753 m)   Wt 176 lb (79.8 kg)   BMI 25.99 kg/m  , BMI Body mass index is 25.99 kg/m. GEN: Well nourished, well developed, in no acute distress  HEENT: normal  Neck: no JVD or masses. There is left carotid bruit and a bruit in the left subclavian artery area.  Cardiac: RRR; no , rubs, or gallops,no edema . 2/6 systolic ejection murmur in the aortic area Respiratory:  clear to auscultation bilaterally, normal work of breathing GI: soft, nontender, nondistended, + BS MS: no deformity or atrophy  Skin: warm and dry, no rash Neuro:  Strength and sensation are intact Psych: euthymic mood, full affect   EKG:  EKG is ordered today. The ekg ordered today demonstrates normal sinus rhythm with no significant ST or T wave changes.   Recent Labs: 12/01/2016: TSH 0.21 12/24/2016: ALT 10; Hemoglobin 15.0; Platelets 158 12/25/2016: BUN 26; Creatinine, Ser 1.25; Potassium 3.9; Sodium 139    Lipid Panel    Component Value Date/Time   CHOL 110 09/08/2016 0922   CHOL 125 01/31/2014 0756   TRIG 88.0 09/08/2016 0922   HDL 52.80 09/08/2016 0922   HDL 63 01/31/2014 0756   CHOLHDL 2 09/08/2016 0922   VLDL 17.6 09/08/2016 0922   LDLCALC 40 09/08/2016 0922   LDLCALC 46 01/31/2014 0756      Wt Readings from Last 3 Encounters:  12/29/16 176 lb (79.8 kg)  12/25/16 173 lb 9.6 oz (78.7  kg)  12/22/16 165 lb (74.8 kg)        ASSESSMENT AND PLAN:  1.  Extensive occlusive bilateral leg DVT: He is very symptomatic from this and given the burden of clot, this is a life-threatening situation. He does have an IVC filter in place. He could not be anticoagulated in the past when he had a small intracranial bleed. However, that has not been an issue lately and recent CT scan and MRI of the brain was unremarkable. The patient was started on Eliquis yesterday but I don't think this is going to be enough to relieve the obstruction. The patient will require catheter-based intervention/thrombolysis. I discussed the case with Dr. Delana Meyer who agrees.   I'm going to send the patient to the emergency room for admission. Eliquis can be switched to heparin while he is hospitalized.  2. Dizziness: is likely a vagal response to pain. However, I do recommend checking an echocardiogram while he is hospitalized.  3. Coronary artery disease involving native coronary arteries without angina:  Continue medical therapy.  4. Bilateral carotid artery stenosis: Continue aggressive treatment of risk factors .  5. Bilateral renal artery stenosis: Status post left renal artery stenting.  He is known to have right renal artery stenosis which is being treated medically.  6. Abdominal aortic aneurysm: This is still small in size.    7. Essential hypertension: Blood pressure has been running low recently. Hold amlodipine.  8. Peripheral arterial disease with previous iliac stenting.  He has known SFA disease being treated medically.  9. Status post left subclavian artery stenting. Left radial pulse is normal.   Disposition:     Signed,  [name], MD  12/29/2016 2:37 PM    [place]

## 2016-12-29 NOTE — Progress Notes (Signed)
Pnt admission complete. Pnt oriented to room and unit. Noted pnt has swelling in BLE and groin area. Also noted groin red and moist. Applied powder to both sites. Pnt denies any discomfort that needs medication at this time. Bed low and locked, call bell in reach. Will continue to monitor and assess.

## 2016-12-29 NOTE — ED Notes (Addendum)
Pt sent from Dr. Tyrell Antonio office for admission due to confirmed bilateral lower extremity blood clots on outpatient ultrasound yestserday. Pt having swelling in lower extremities and lower abdomen, pain in legs and groin, dusky appearance to bilateral ankles. Pt was not taking blood thinners until yesterday, when he was prescribed by Dr. Fletcher Anon. Hx of one prior blood clot in right leg, pt has filter in place. Pt alert and oriented X4, active, cooperative, pt in NAD. RR even and unlabored.  SOB on exertion per patient report, dizziness upon sustained walking or standing. Denies falls.

## 2016-12-29 NOTE — ED Notes (Signed)
ED Provider at bedside. 

## 2016-12-29 NOTE — H&P (Signed)
[facility] NAME: [name]    MR#:  [id number]  DATE OF BIRTH:  08/15/1940  DATE OF ADMISSION:  12/29/2016  PRIMARY CARE PHYSICIAN: Owens Loffler, MD   REQUESTING/REFERRING PHYSICIAN: Dr. Quentin Cornwall  CHIEF COMPLAINT:   Chief Complaint  Patient presents with  . Leg Pain    HISTORY OF PRESENT ILLNESS:  [name]  is a 76 y.o. male with a known history of right leg DVT, status post IVC filter due to history of intraventricular hemorrhage after cardiac catheterization, CHF, hypertension, hypothyroidism who was recently in the hospital for dizziness thought to be due to dehydration presents to the emergency room sent in by his cardiologist Dr. Fletcher Anon after the bilateral lower extremity venous Doppler showed extensive right and left lower extremity DVTs. He is on aspirin. IVC filter in place. He has also been feeling more short of breath, extremely fatigued and dizzy on walking with hypotension with exertion. Patient will need lower extremity thrombectomy and is being admitted on heparin drip. Started on Eliquis yesterday and patient took 1 dose.  PAST MEDICAL HISTORY:   Past Medical History:  Diagnosis Date  . AAA (abdominal aortic aneurysm) ([place])   . Aortic calcification ([place]) 05/01/2013  . Arthritis    "hands; not dx'd" (12/26/2012)  . Bilateral renal artery stenosis ([place])    a. 06/2013 Angio/PTA: LRA: 95 (5x18 Herculink stent), RRA 60ost, celiac/SMA nl, RCIA patent stents extending into dist Ao, RIIA 70ost, REIA min irregs, LCIA patent stent, LIIA 60-70ost, LEIA min irregs.  . CHF (congestive heart failure) ([place])    Patient states he doesn't have CHF anymore per cardiologist.  . Coronary artery disease   . Hypertension   . Hypothyroidism   . Intraventricular hemorrhage ([place]) 07/12/2012  . Peripheral arterial disease ([place])    a. Previous left lower extremity stenting by Dr. Jamal Collin;  b. 12/2012 s/p bilat ostial common iliac  stenting.  . Pneumonia   . Right leg DVT ([place]) 08/13/2012  . Subclavian artery stenosis, left ([place]) 05/2012   Status post stenting of the ostium and self-expanding stent placement to the left axillary artery  . Tobacco abuse    d/c 2012  . Venous insufficiency     PAST SURGICAL HISTORY:   Past Surgical History:  Procedure Laterality Date  . ABDOMINAL ANGIOGRAM  07/10/2013   WITH BI-FEMORAL RUNOFF       DR ARIDA  . ABDOMINAL AORTAGRAM N/A 12/26/2012   Procedure: ABDOMINAL Maxcine Ham;  Surgeon: Wellington Hampshire, MD;  Location: [facility] CATH LAB;  Service: Cardiovascular;  Laterality: N/A;  . ABDOMINAL AORTAGRAM N/A 07/10/2013   Procedure: ABDOMINAL Maxcine Ham;  Surgeon: Wellington Hampshire, MD;  Location: [facility] CATH LAB;  Service: Cardiovascular;  Laterality: N/A;  . ANGIOPLASTY / STENTING FEMORAL  05/2012  . ANGIOPLASTY / STENTING ILIAC Bilateral 12/26/2012  . ARCH AORTOGRAM N/A 06/20/2012   Procedure: ARCH AORTOGRAM;  Surgeon: Wellington Hampshire, MD;  Location: [facility] CATH LAB;  Service: Cardiovascular;  Laterality: N/A;  . CARDIAC CATHETERIZATION  2/14   [facility]  . CHOLECYSTECTOMY  1990's  . CORONARY ARTERY BYPASS GRAFT  07/11/2011   Procedure: CORONARY ARTERY BYPASS GRAFTING (CABG);  Surgeon: Grace Isaac, MD;  Location: [facility];  Service: Open Heart Surgery;  Laterality: N/A;  Times 3. On Pump. Using right greater saphenous vein and left internal mammary artery.   Marland Kitchen HERNIA REPAIR    . INSERTION OF ILIAC STENT Bilateral 12/26/2012   Procedure:  INSERTION OF ILIAC STENT;  Surgeon: Wellington Hampshire, MD;  Location: [facility] CATH LAB;  Service: Cardiovascular;  Laterality: Bilateral;  Bilateral Common Iliac Artery  . LEFT AND RIGHT HEART CATHETERIZATION WITH CORONARY ANGIOGRAM  07/07/2011   Procedure: LEFT AND RIGHT HEART CATHETERIZATION WITH CORONARY ANGIOGRAM;  Surgeon: Minna Merritts, MD;  Location: [facility] CATH LAB;  Service: Cardiovascular;;  . PERCUTANEOUS STENT INTERVENTION  06/20/2012   Procedure: PERCUTANEOUS STENT  INTERVENTION;  Surgeon: Wellington Hampshire, MD;  Location: [facility] CATH LAB;  Service: Cardiovascular;;  . RENAL ARTERY ANGIOPLASTY Left 07/10/2013   DR ARIDA  . SUBCLAVIAN ARTERY STENT  05/2012   "2 stents" (12/26/2012)  . TOOTH EXTRACTION  Spring 2017   mulitple (6)  . VENA CAVA FILTER PLACEMENT  07/2012   Removable    SOCIAL HISTORY:   Social History  Substance Use Topics  . Smoking status: Former Smoker    Packs/day: 1.00    Years: 50.00    Types: Cigarettes    Quit date: 12/25/2010  . Smokeless tobacco: Never Used  . Alcohol use No    FAMILY HISTORY:   Family History  Problem Relation Age of Onset  . Alcohol abuse Father   . Cirrhosis Father   . Hypothyroidism Sister     DRUG ALLERGIES:   Allergies  Allergen Reactions  . Plavix [Clopidogrel Bisulfate]     Brain hemorrhage prev while on plavix and aspirin    REVIEW OF SYSTEMS:   Review of Systems  Constitutional: Positive for malaise/fatigue. Negative for chills, fever and weight loss.  HENT: Negative for hearing loss and nosebleeds.   Eyes: Negative for blurred vision, double vision and pain.  Respiratory: Positive for shortness of breath. Negative for cough, hemoptysis, sputum production and wheezing.   Cardiovascular: Negative for chest pain, palpitations, orthopnea and leg swelling.  Gastrointestinal: Negative for abdominal pain, constipation, diarrhea, nausea and vomiting.  Genitourinary: Negative for dysuria and hematuria.  Musculoskeletal: Positive for falls. Negative for back pain and myalgias.  Skin: Negative for rash.  Neurological: Positive for dizziness and weakness. Negative for tremors, sensory change, speech change, focal weakness, seizures and headaches.  Endo/Heme/Allergies: Does not bruise/bleed easily.  Psychiatric/Behavioral: Negative for depression and memory loss. The patient is not nervous/anxious.     MEDICATIONS AT HOME:   Prior to Admission medications   Medication Sig Start Date End Date  Taking? Authorizing Provider  amLODipine (NORVASC) 5 MG tablet Take 5 mg by mouth daily.   Yes [provider]  apixaban (ELIQUIS) 5 MG TABS tablet Take 10 mg by mouth 2 (two) times daily. After 7 days decrease to 5 mg twice a day 12/27/16  Yes [provider]  atorvastatin (LIPITOR) 20 MG tablet Take 20 mg by mouth at bedtime.   Yes [provider]  carvedilol (COREG) 6.25 MG tablet Take 6.25 mg by mouth 2 (two) times daily with a meal.   Yes [provider]  levothyroxine (SYNTHROID, LEVOTHROID) 150 MCG tablet Take 150 mcg by mouth daily before breakfast.   Yes [provider]     VITAL SIGNS:  Blood pressure (!) 161/65, pulse 83, temperature 97.9 F (36.6 C), temperature source Oral, resp. rate (!) 23, height 5\' 9"  (1.753 m), weight 79.8 kg (176 lb), SpO2 98 %.  PHYSICAL EXAMINATION:  Physical Exam  GENERAL:  76 y.o.-year-old patient lying in the bed with no acute distress.  EYES: Pupils equal, round, reactive to light and accommodation. No scleral icterus. Extraocular muscles intact.  HEENT: Head atraumatic, normocephalic. Oropharynx and nasopharynx clear. No oropharyngeal erythema, moist oral mucosa  NECK:  Supple, no jugular venous distention. No thyroid enlargement, no tenderness.  LUNGS: Normal breath sounds bilaterally, no wheezing, rales, rhonchi. No use of accessory muscles of respiration.  CARDIOVASCULAR: S1, S2 normal. No murmurs, rubs, or gallops.  ABDOMEN: Soft, nontender, nondistended. Bowel sounds present. No organomegaly or mass.  EXTREMITIES: bilateral lower extremity edema. NEUROLOGIC: Cranial nerves II through XII are intact. No focal Motor or sensory deficits appreciated b/l PSYCHIATRIC: The patient is alert and oriented x 3. Good affect.  SKIN: No obvious rash, lesion, or ulcer.   LABORATORY PANEL:   CBC  Recent Labs Lab 12/29/16 1615  WBC 11.5*  HGB 15.0  HCT 44.8  PLT 196    ------------------------------------------------------------------------------------------------------------------  Chemistries   Recent Labs Lab 12/29/16 1615  NA 136  K 4.8  CL 101  CO2 24  GLUCOSE 118*  BUN 26*  CREATININE 1.36*  CALCIUM 9.5  AST 20  ALT 12*  ALKPHOS 82  BILITOT 1.5*   ------------------------------------------------------------------------------------------------------------------  Cardiac Enzymes  Recent Labs Lab 12/24/16 1817  TROPONINI <0.03   ------------------------------------------------------------------------------------------------------------------  RADIOLOGY:  US Venous Img Lower Bilateral  Result Date: 12/28/2016 CLINICAL DATA:  Leg swelling and pain for 5 days EXAM: BILATERAL LOWER EXTREMITY VENOUS DOPPLER ULTRASOUND TECHNIQUE: Gray-scale sonography with graded compression, as well as color Doppler and duplex ultrasound were performed to evaluate the lower extremity deep venous systems from the level of the common femoral vein and including the common femoral, femoral, profunda femoral, popliteal and calf veins including the posterior tibial, peroneal and gastrocnemius veins when visible. The superficial great saphenous vein was also interrogated. Spectral Doppler was utilized to evaluate flow at rest and with distal augmentation maneuvers in the common femoral, femoral and popliteal veins. COMPARISON:  07/27/2012 FINDINGS: RIGHT LOWER EXTREMITY Common Femoral Vein: Noncompressible with occlusive thrombus present. Saphenofemoral Junction: Noncompressible with occlusive thrombus present. Profunda Femoral Vein: Noncompressible with occlusive thrombus present. Femoral Vein: Noncompressible with occlusive thrombus present. Popliteal Vein: No evidence of thrombus. Normal compressibility, respiratory phasicity and response to augmentation. Calf Veins: No evidence of thrombus. Normal compressibility and flow on color Doppler imaging. LEFT LOWER  EXTREMITY Common Femoral Vein: Noncompressible with occlusive thrombus present. Saphenofemoral Junction: Noncompressible with occlusive thrombus present. Profunda Femoral Vein: Noncompressible with occlusive thrombus present Femoral Vein: No evidence of thrombus. Normal compressibility, respiratory phasicity and response to augmentation. Popliteal Vein: Noncompressible with occlusive thrombus present. Calf Veins: No evidence of thrombus. Normal compressibility and flow on color Doppler imaging. IMPRESSION: 1. Extensive occlusive thrombus within the right common femoral and femoral veins with extension of occlusive thrombus into the saphenous femoral junction and image profunda vein. Deep veins are patent from the popliteal vein to the calf. 2. Positive for left lower extremity DVT with occlusive thrombus present in the left common femoral vein an visualized portions of the staff in all femoral junction and profunda vein. Occlusive thrombus also present within the left popliteal vein. Left femoral vein and calf veins appear patent. Critical Value/emergent results were called by telephone at the time of interpretation on 12/28/2016 at 4:35 pm to Dr. Rockey Situ who was covering for [facility] , who verbally acknowledged these results. Pt reportedly has IVC filter in place. Electronically Signed   By: Donavan Foil M.D.   On: 12/28/2016 16:36     IMPRESSION AND PLAN:   * Extensive Bilateral LE DVT with high risk for limb ischemia We'll admit and  start on heparin drip. Consult vascular surgery for thrombolytic therapy.  * Shortness of breath Check V/Q scan for pulmonary embolism. Check echocardiogram.  * hypertension. Continue home medications.  * CKD stage III. Stable.  All the records are reviewed and case discussed with ED provider. Management plans discussed with the patient, family and they are in agreement.  CODE STATUS: FULL CODE  TOTAL TIME TAKING CARE OF THIS PATIENT: 40 minutes.   Hillary Bow R M.D on 12/29/2016 at 5:43 PM  Between 7am to 6pm - Pager - [phone]  After 6pm go to [email or web address] - password [email or web address] [facility]  Office  [phone]  CC: Primary care physician; Owens Loffler, MD  Note: This dictation was prepared with Dragon dictation along with smaller phrase technology. Any transcriptional errors that result from this process are unintentional.

## 2016-12-29 NOTE — ED Notes (Addendum)
Pt brought over from Dr. Jacklynn Ganong office for eval of DVT, states he started blood thinners yesterday, states vascular is to be consulted for intervention

## 2016-12-29 NOTE — ED Provider Notes (Signed)
[facility] Emergency Department Provider Note    None    (approximate)  I have reviewed the triage vital signs and the nursing notes.   HISTORY  Chief Complaint Leg Pain    HPI [name] is a 76 y.o. male presents with chief complaint of swelling in bilateral lower extremities as well as enema and shortness of breath. Does have a history of DVT with IVC filter. Was recently started on a liquid was but over the past several days his swelling is gotten more significant. No fevers. He is having difficulty ambulating. Was seen in our cardiology clinic today where outpatient venous duplex showed extensive occlusive DVTs. No numbness or tingling.  No chest pain at this time.   Past Medical History:  Diagnosis Date  . AAA (abdominal aortic aneurysm) ([place])   . Aortic calcification ([place]) 05/01/2013  . Arthritis    "hands; not dx'd" (12/26/2012)  . Bilateral renal artery stenosis ([place])    a. 06/2013 Angio/PTA: LRA: 95 (5x18 Herculink stent), RRA 60ost, celiac/SMA nl, RCIA patent stents extending into dist Ao, RIIA 70ost, REIA min irregs, LCIA patent stent, LIIA 60-70ost, LEIA min irregs.  . CHF (congestive heart failure) ([place])    Patient states he doesn't have CHF anymore per cardiologist.  . Coronary artery disease   . Hypertension   . Hypothyroidism   . Intraventricular hemorrhage ([place]) 07/12/2012  . Peripheral arterial disease ([place])    a. Previous left lower extremity stenting by Dr. Jamal Collin;  b. 12/2012 s/p bilat ostial common iliac stenting.  . Pneumonia   . Right leg DVT ([place]) 08/13/2012  . Subclavian artery stenosis, left ([place]) 05/2012   Status post stenting of the ostium and self-expanding stent placement to the left axillary artery  . Tobacco abuse    d/c 2012  . Venous insufficiency    Family History  Problem Relation Age of Onset  . Alcohol abuse Father   . Cirrhosis Father   . Hypothyroidism Sister    Past Surgical History:  Procedure  Laterality Date  . ABDOMINAL ANGIOGRAM  07/10/2013   WITH BI-FEMORAL RUNOFF       DR ARIDA  . ABDOMINAL AORTAGRAM N/A 12/26/2012   Procedure: ABDOMINAL Maxcine Ham;  Surgeon: Wellington Hampshire, MD;  Location: [place] CATH LAB;  Service: Cardiovascular;  Laterality: N/A;  . ABDOMINAL AORTAGRAM N/A 07/10/2013   Procedure: ABDOMINAL Maxcine Ham;  Surgeon: Wellington Hampshire, MD;  Location: [place] CATH LAB;  Service: Cardiovascular;  Laterality: N/A;  . ANGIOPLASTY / STENTING FEMORAL  05/2012  . ANGIOPLASTY / STENTING ILIAC Bilateral 12/26/2012  . ARCH AORTOGRAM N/A 06/20/2012   Procedure: ARCH AORTOGRAM;  Surgeon: Wellington Hampshire, MD;  Location: [place] CATH LAB;  Service: Cardiovascular;  Laterality: N/A;  . CARDIAC CATHETERIZATION  2/14   [facility]  . CHOLECYSTECTOMY  1990's  . CORONARY ARTERY BYPASS GRAFT  07/11/2011   Procedure: CORONARY ARTERY BYPASS GRAFTING (CABG);  Surgeon: Grace Isaac, MD;  Location: [place];  Service: Open Heart Surgery;  Laterality: N/A;  Times 3. On Pump. Using right greater saphenous vein and left internal mammary artery.   Marland Kitchen HERNIA REPAIR    . INSERTION OF ILIAC STENT Bilateral 12/26/2012   Procedure: INSERTION OF ILIAC STENT;  Surgeon: Wellington Hampshire, MD;  Location: [place] CATH LAB;  Service: Cardiovascular;  Laterality: Bilateral;  Bilateral Common Iliac Artery  . LEFT AND RIGHT HEART CATHETERIZATION WITH CORONARY ANGIOGRAM  07/07/2011   Procedure: LEFT AND RIGHT HEART CATHETERIZATION  WITH CORONARY ANGIOGRAM;  Surgeon: Minna Merritts, MD;  Location: [facility] CATH LAB;  Service: Cardiovascular;;  . PERCUTANEOUS STENT INTERVENTION  06/20/2012   Procedure: PERCUTANEOUS STENT INTERVENTION;  Surgeon: Wellington Hampshire, MD;  Location: [place] CATH LAB;  Service: Cardiovascular;;  . RENAL ARTERY ANGIOPLASTY Left 07/10/2013   DR ARIDA  . SUBCLAVIAN ARTERY STENT  05/2012   "2 stents" (12/26/2012)  . TOOTH EXTRACTION  Spring 2017   mulitple (6)  . VENA CAVA FILTER PLACEMENT  07/2012   Removable   Patient Active  Problem List   Diagnosis Date Noted  . Syncope 12/24/2016  . RAS (renal artery stenosis) ([place]) 07/10/2013  . Paroxysmal a-fib: briefly in ED now in NSR 05/24/2013  . Aortic calcification ([place]) 05/01/2013  . CKD (chronic kidney disease) stage 3, GFR 30-59 ml/min 05/01/2013  . Bilateral renal artery stenosis ([place])   . Right leg DVT ([place]) 08/13/2012  . HTN (hypertension) 07/15/2012  . Intraventricular hemorrhage ([place]) 07/12/2012  . Peripheral arterial disease ([place])   . Ischemic cardiomyopathy 07/08/2011  . CAD (coronary artery disease), native coronary artery 07/07/2011  . Subclavian arterial stenosis ([place]) 04/25/2011  . BPH (benign prostatic hyperplasia) 12/29/2010  . COPD (chronic obstructive pulmonary disease) ([place]) 01/08/2010  . Abdominal aortic aneurysm ([place]) 11/06/2009  . HYPERCHOLESTEROLEMIA 10/09/2009  . TOBACCO ABUSE 04/11/2008  . PROSTATE SPECIFIC ANTIGEN, ELEVATED 04/11/2008  . Hypothyroidism 09/07/2006      Prior to Admission medications   Medication Sig Start Date End Date Taking? Authorizing Provider  amLODipine (NORVASC) 5 MG tablet Take 5 mg by mouth daily.   Yes [provider]  apixaban (ELIQUIS) 5 MG TABS tablet Take 10 mg by mouth 2 (two) times daily. After 7 days decrease to 5 mg twice a day 12/27/16  Yes [provider]  atorvastatin (LIPITOR) 20 MG tablet Take 20 mg by mouth at bedtime.   Yes [provider]  carvedilol (COREG) 6.25 MG tablet Take 6.25 mg by mouth 2 (two) times daily with a meal.   Yes [provider]  levothyroxine (SYNTHROID, LEVOTHROID) 150 MCG tablet Take 150 mcg by mouth daily before breakfast.   Yes [provider]    Allergies Plavix [clopidogrel bisulfate]    Social History Social History  Substance Use Topics  . Smoking status: Former Smoker    Packs/day: 1.00    Years: 50.00    Types: Cigarettes    Quit date: 12/25/2010  . Smokeless tobacco: Never Used  . Alcohol use No     Review of Systems Patient denies headaches, rhinorrhea, blurry vision, numbness, shortness of breath, chest pain, edema, cough, abdominal pain, nausea, vomiting, diarrhea, dysuria, fevers, rashes or hallucinations unless otherwise stated above in HPI. ____________________________________________   PHYSICAL EXAM:  VITAL SIGNS: Vitals:   12/29/16 1611 12/29/16 1700  BP: (!) 125/58 (!) 161/65  Pulse: 83   Resp: 16 (!) 23  Temp: 97.9 F (36.6 C)   SpO2: 98%     Constitutional: Alert and oriented. Well appearing and in no acute distress. Eyes: Conjunctivae are normal.  Head: Atraumatic. Nose: No congestion/rhinnorhea. Mouth/Throat: Mucous membranes are moist.   Neck: No stridor. Painless ROM.  Cardiovascular: Normal rate, regular rhythm. Grossly normal heart sounds.  Good peripheral circulation. Respiratory: Normal respiratory effort.  No retractions. Lungs CTAB. Gastrointestinal: Soft and nontender. No distention. No abdominal bruits. No CVA tenderness. Genitourinary:  No scrotal swelling or hernia Musculoskeletal: +++ BLE pitting edema and early cerulea dolens. Neurologic:  Normal speech and  language. No gross focal neurologic deficits are appreciated. No facial droop Skin:  Skin is warm, dry and intact. No rash noted. Psychiatric: Mood and affect are normal. Speech and behavior are normal.  ____________________________________________   LABS (all labs ordered are listed, but only abnormal results are displayed)  Results for orders placed or performed during the hospital encounter of 12/29/16 (from the past 24 hour(s))  CBC with Differential     Status: Abnormal   Collection Time: 12/29/16  4:15 PM  Result Value Ref Range   WBC 11.5 (H) 3.8 - 10.6 K/uL   RBC 5.10 4.40 - 5.90 MIL/uL   Hemoglobin 15.0 13.0 - 18.0 g/dL   HCT 44.8 40.0 - 52.0 %   MCV 87.9 80.0 - 100.0 fL   MCH 29.5 26.0 - 34.0 pg   MCHC 33.6 32.0 - 36.0 g/dL   RDW 14.9 (H) 11.5 - 14.5 %   Platelets  196 150 - 440 K/uL   Neutrophils Relative % 74 %   Neutro Abs 8.5 (H) 1.4 - 6.5 K/uL   Lymphocytes Relative 15 %   Lymphs Abs 1.7 1.0 - 3.6 K/uL   Monocytes Relative 10 %   Monocytes Absolute 1.1 (H) 0.2 - 1.0 K/uL   Eosinophils Relative 0 %   Eosinophils Absolute 0.0 0 - 0.7 K/uL   Basophils Relative 1 %   Basophils Absolute 0.1 0 - 0.1 K/uL  Comprehensive metabolic panel     Status: Abnormal   Collection Time: 12/29/16  4:15 PM  Result Value Ref Range   Sodium 136 135 - 145 mmol/L   Potassium 4.8 3.5 - 5.1 mmol/L   Chloride 101 101 - 111 mmol/L   CO2 24 22 - 32 mmol/L   Glucose, Bld 118 (H) 65 - 99 mg/dL   BUN 26 (H) 6 - 20 mg/dL   Creatinine, Ser 1.36 (H) 0.61 - 1.24 mg/dL   Calcium 9.5 8.9 - 10.3 mg/dL   Total Protein 7.8 6.5 - 8.1 g/dL   Albumin 4.0 3.5 - 5.0 g/dL   AST 20 15 - 41 U/L   ALT 12 (L) 17 - 63 U/L   Alkaline Phosphatase 82 38 - 126 U/L   Total Bilirubin 1.5 (H) 0.3 - 1.2 mg/dL   GFR calc non Af Amer 49 (L) >60 mL/min   GFR calc Af Amer 57 (L) >60 mL/min   Anion gap 11 5 - 15  Protime-INR     Status: Abnormal   Collection Time: 12/29/16  4:15 PM  Result Value Ref Range   Prothrombin Time 20.1 (H) 11.4 - 15.2 seconds   INR 1.73   APTT     Status: Abnormal   Collection Time: 12/29/16  4:15 PM  Result Value Ref Range   aPTT 38 (H) 24 - 36 seconds   ____________________________________________  EKG My review and personal interpretation at Time: 16:28   Indication: dvt  Rate: 80  Rhythm: sinus Axis: normal Other: non specifc changes, no stemi, normal intervals ____________________________________________  RADIOLOGY  I personally reviewed all radiographic images ordered to evaluate for the above acute complaints and reviewed radiology reports and findings.  These findings were personally discussed with the patient.  Please see medical record for radiology report. ____________________________________________   PROCEDURES  Procedure(s) performed:   Procedures    Critical Care performed: yes CRITICAL CARE Performed by: Merlyn Lot   Total critical care time: 35 minutes  Critical care time was exclusive of separately billable procedures and treating  other patients.  Critical care was necessary to treat or prevent imminent or life-threatening deterioration.  Critical care was time spent personally by me on the following activities: development of treatment plan with patient and/or surrogate as well as nursing, discussions with consultants, evaluation of patient's response to treatment, examination of patient, obtaining history from patient or surrogate, ordering and performing treatments and interventions, ordering and review of laboratory studies, ordering and review of radiographic studies, pulse oximetry and re-evaluation of patient's condition.  ____________________________________________   INITIAL IMPRESSION / ASSESSMENT AND PLAN / ED COURSE  Pertinent labs & imaging results that were available during my care of the patient were reviewed by me and considered in my medical decision making (see chart for details).  DDX: dvt, ischemia, chf, cellulitis  [name] is a 76 y.o. who presents to the ED with Extensive occlusive DVTs bilateral lower extremities with skin changes and significant discomfort. Patient otherwise he will dynamically stable. She'll be started on IV heparin as he is failing outpatient treatment with a liquids. Spoke with vascular surgery who does plan to take patient for catheter directed thrombectomy or thrombolysis in the morning.  Spoke with hospitalist service who kindly agrees to admit patient for further workup and monitoring.  Have discussed with the patient and available family all diagnostics and treatments performed thus far and all questions were answered to the best of my ability. The patient demonstrates understanding and agreement with plan.        ____________________________________________   FINAL CLINICAL IMPRESSION(S) / ED DIAGNOSES  Final diagnoses:  Acute deep vein thrombosis (DVT) of femoral vein of both lower extremities (HCC)  Edema, unspecified type      NEW MEDICATIONS STARTED DURING THIS VISIT:  New Prescriptions   No medications on file     Note:  This document was prepared using Dragon voice recognition software and may include unintentional dictation errors.    Merlyn Lot, MD 12/29/16 [phone]

## 2016-12-29 NOTE — Progress Notes (Signed)
ANTICOAGULATION CONSULT NOTE - Initial Consult  Pharmacy Consult for Heparin  Indication: VTE   Allergies  Allergen Reactions  . Plavix [Clopidogrel Bisulfate]     Brain hemorrhage prev while on plavix and aspirin    Patient Measurements: Height: 5\' 9"  (175.3 cm) Weight: 176 lb (79.8 kg) IBW/kg (Calculated) : 70.7 Heparin Dosing Weight: 79.8  Vital Signs: Temp: 97.9 F (36.6 C) (09/06 1611) Temp Source: Oral (09/06 1611) BP: 125/58 (09/06 1611) Pulse Rate: 83 (09/06 1611)  Labs:  Recent Labs  12/29/16 1615  HGB 15.0  HCT 44.8  PLT 196  LABPROT 20.1*  INR 1.73    Estimated Creatinine Clearance: 51.1 mL/min (A) (by C-G formula based on SCr of 1.25 mg/dL (H)).   Medical History: Past Medical History:  Diagnosis Date  . AAA (abdominal aortic aneurysm) ([place])   . Aortic calcification ([place]) 05/01/2013  . Arthritis    "hands; not dx'd" (12/26/2012)  . Bilateral renal artery stenosis ([place])    a. 06/2013 Angio/PTA: LRA: 95 (5x18 Herculink stent), RRA 60ost, celiac/SMA nl, RCIA patent stents extending into dist Ao, RIIA 70ost, REIA min irregs, LCIA patent stent, LIIA 60-70ost, LEIA min irregs.  . CHF (congestive heart failure) ([place])    Patient states he doesn't have CHF anymore per cardiologist.  . Coronary artery disease   . Hypertension   . Hypothyroidism   . Intraventricular hemorrhage ([place]) 07/12/2012  . Peripheral arterial disease ([place])    a. Previous left lower extremity stenting by Dr. Jamal Collin;  b. 12/2012 s/p bilat ostial common iliac stenting.  . Pneumonia   . Right leg DVT ([place]) 08/13/2012  . Subclavian artery stenosis, left ([place]) 05/2012   Status post stenting of the ostium and self-expanding stent placement to the left axillary artery  . Tobacco abuse    d/c 2012  . Venous insufficiency     Medications:   (Not in a hospital admission) Scheduled:   Infusions:  . heparin      Assessment: Pharmacy consulted to dose and monitor Heparin gtt in this 76  year old male with VTE. Patient on apixaban as an outpatient.Spoke with patient and patient states last dose of eliquis was this morning @ ~10:00.  Goal of Therapy:  Heparin level 0.3-0.7 units/ml Monitor platelets by anticoagulation protocol: Yes   Plan:  No bolus warranted @ this time.  Start heparin infusion at 1200 units/hr Check anti-Xa level in 8 hours and daily while on heparin Continue to monitor H&H and platelets  Will need to monitor APTT and HL until levels within goal range.   Persephanie Laatsch D 12/29/2016,4:45 PM

## 2016-12-29 NOTE — ED Triage Notes (Signed)
Patient presents to the ED from his doctor's office.  Patient was told he had multiple blood clots, including some occlusive blood clots in both legs.  Patient is complaining of bilateral leg pain and pelvis pain along with shortness of breath on exertion.  Patient denies chest pain.  Patient takes xarelto.  Patient states his doctor wanted him to come to the ED and be admitted for his blood clots.

## 2016-12-29 NOTE — ED Notes (Signed)
Report to Courtney, RN

## 2016-12-29 NOTE — ED Notes (Signed)
Pt NPO since 10AM.

## 2016-12-30 ENCOUNTER — Inpatient Hospital Stay: Payer: Medicare Other

## 2016-12-30 ENCOUNTER — Inpatient Hospital Stay (HOSPITAL_COMMUNITY)
Admit: 2016-12-30 | Discharge: 2016-12-30 | Disposition: A | Discharge status: Home / Self Care | Payer: Medicare Other | Attending: Internal Medicine | Admitting: Internal Medicine

## 2016-12-30 ENCOUNTER — Encounter
Admission: EM | Disposition: A | Discharge status: Home / Self Care | Payer: Self-pay | Source: Home / Self Care | Attending: Specialist

## 2016-12-30 DIAGNOSIS — N183 Chronic kidney disease, stage 3 (moderate): Secondary | ICD-10-CM

## 2016-12-30 DIAGNOSIS — I82220 Acute embolism and thrombosis of inferior vena cava: Secondary | ICD-10-CM

## 2016-12-30 DIAGNOSIS — I82402 Acute embolism and thrombosis of unspecified deep veins of left lower extremity: Secondary | ICD-10-CM

## 2016-12-30 DIAGNOSIS — R0602 Shortness of breath: Secondary | ICD-10-CM

## 2016-12-30 DIAGNOSIS — I340 Nonrheumatic mitral (valve) insufficiency: Secondary | ICD-10-CM

## 2016-12-30 DIAGNOSIS — I82401 Acute embolism and thrombosis of unspecified deep veins of right lower extremity: Secondary | ICD-10-CM

## 2016-12-30 HISTORY — PX: LOWER EXTREMITY VENOGRAPHY: CATH118253

## 2016-12-30 LAB — CBC
HCT: 38.9 % — ABNORMAL LOW (ref 40.0–52.0)
Hemoglobin: 13.4 g/dL (ref 13.0–18.0)
MCH: 30 pg (ref 26.0–34.0)
MCHC: 34.5 g/dL (ref 32.0–36.0)
MCV: 87 fL (ref 80.0–100.0)
PLATELETS: 167 10*3/uL (ref 150–440)
RBC: 4.47 MIL/uL (ref 4.40–5.90)
RDW: 14.7 % — AB (ref 11.5–14.5)
WBC: 9.5 10*3/uL (ref 3.8–10.6)

## 2016-12-30 LAB — BASIC METABOLIC PANEL
Anion gap: 6 (ref 5–15)
BUN: 25 mg/dL — AB (ref 6–20)
CO2: 25 mmol/L (ref 22–32)
CREATININE: 1.33 mg/dL — AB (ref 0.61–1.24)
Calcium: 8.6 mg/dL — ABNORMAL LOW (ref 8.9–10.3)
Chloride: 105 mmol/L (ref 101–111)
GFR calc Af Amer: 59 mL/min — ABNORMAL LOW (ref >60)
GFR, EST NON AFRICAN AMERICAN: 51 mL/min — AB (ref >60)
GLUCOSE: 109 mg/dL — AB (ref 65–99)
POTASSIUM: 4.3 mmol/L (ref 3.5–5.1)
SODIUM: 136 mmol/L (ref 135–145)

## 2016-12-30 LAB — ECHOCARDIOGRAM COMPLETE
Height: 69 in
Weight: 2816 oz

## 2016-12-30 LAB — HEPARIN LEVEL (UNFRACTIONATED)

## 2016-12-30 LAB — APTT
APTT: 119 s — AB (ref 24–36)
aPTT: 107 seconds — ABNORMAL HIGH (ref 24–36)

## 2016-12-30 SURGERY — LOWER EXTREMITY VENOGRAPHY
Anesthesia: Moderate Sedation | Laterality: Bilateral

## 2016-12-30 MED ORDER — AMLODIPINE BESYLATE 5 MG PO TABS
5.0000 mg | ORAL_TABLET | Freq: Every day | ORAL | Status: DC
  Administered 2016-12-31: 5 mg via ORAL
  Filled 2016-12-30: qty 1

## 2016-12-30 MED ORDER — HEPARIN (PORCINE) IN NACL 2-0.9 UNIT/ML-% IJ SOLN
INTRAMUSCULAR | Status: AC
  Filled 2016-12-30: qty 1000

## 2016-12-30 MED ORDER — MIDAZOLAM HCL 5 MG/5ML IJ SOLN
INTRAMUSCULAR | Status: AC
  Filled 2016-12-30: qty 5

## 2016-12-30 MED ORDER — HEPARIN (PORCINE) IN NACL 100-0.45 UNIT/ML-% IJ SOLN
1050.0000 [IU]/h | INTRAMUSCULAR | Status: DC
  Administered 2016-12-30: 1050 [IU]/h via INTRAVENOUS

## 2016-12-30 MED ORDER — CARVEDILOL 6.25 MG PO TABS: 6.2500 mg | ORAL_TABLET | Freq: Two times a day (BID) | ORAL | Status: DC

## 2016-12-30 MED ORDER — CEFAZOLIN SODIUM-DEXTROSE 2-4 GM/100ML-% IV SOLN
2.0000 g | Freq: Once | INTRAVENOUS | Status: AC
  Administered 2016-12-30: 2 g via INTRAVENOUS
  Filled 2016-12-30: qty 100

## 2016-12-30 MED ORDER — CARVEDILOL 6.25 MG PO TABS
6.2500 mg | ORAL_TABLET | Freq: Two times a day (BID) | ORAL | Status: DC
  Administered 2016-12-30 – 2016-12-31 (×2): 6.25 mg via ORAL
  Filled 2016-12-30: qty 1

## 2016-12-30 MED ORDER — HEPARIN SODIUM (PORCINE) 1000 UNIT/ML IJ SOLN
INTRAMUSCULAR | Status: DC | PRN
  Administered 2016-12-30: 3000 [IU] via INTRAVENOUS

## 2016-12-30 MED ORDER — LEVOTHYROXINE SODIUM 150 MCG PO TABS
150.0000 ug | ORAL_TABLET | Freq: Every day | ORAL | Status: DC
  Administered 2016-12-31: 150 ug via ORAL
  Filled 2016-12-30: qty 1
  Filled 2016-12-30: qty 3

## 2016-12-30 MED ORDER — AMLODIPINE BESYLATE 5 MG PO TABS
5.0000 mg | ORAL_TABLET | Freq: Every day | ORAL | Status: DC
  Filled 2016-12-30: qty 1

## 2016-12-30 MED ORDER — ALTEPLASE 2 MG IJ SOLR
INTRAMUSCULAR | Status: AC
  Filled 2016-12-30: qty 6

## 2016-12-30 MED ORDER — MIDAZOLAM HCL 5 MG/5ML IJ SOLN
INTRAMUSCULAR | Status: AC
Start: 2016-12-30 — End: ?
  Filled 2016-12-30: qty 5

## 2016-12-30 MED ORDER — IOPAMIDOL (ISOVUE-300) INJECTION 61%
INTRAVENOUS | Status: DC | PRN
  Administered 2016-12-30: 40 mL via INTRAVENOUS

## 2016-12-30 MED ORDER — FENTANYL CITRATE (PF) 100 MCG/2ML IJ SOLN
INTRAMUSCULAR | Status: AC
  Filled 2016-12-30: qty 2

## 2016-12-30 MED ORDER — ALTEPLASE 2 MG IJ SOLR
INTRAMUSCULAR | Status: DC | PRN
  Administered 2016-12-30: 12 mg

## 2016-12-30 MED ORDER — AMLODIPINE BESYLATE 5 MG PO TABS
5.0000 mg | ORAL_TABLET | ORAL | Status: AC
  Administered 2016-12-30: 5 mg via ORAL
  Filled 2016-12-30: qty 1

## 2016-12-30 MED ORDER — AMLODIPINE BESYLATE 5 MG PO TABS: 5.0000 mg | ORAL_TABLET | Freq: Every day | ORAL | Status: DC

## 2016-12-30 MED ORDER — CEFAZOLIN SODIUM-DEXTROSE 1-4 GM/50ML-% IV SOLN
INTRAVENOUS | Status: AC
  Filled 2016-12-30: qty 50

## 2016-12-30 MED ORDER — LABETALOL HCL 5 MG/ML IV SOLN
10.0000 mg | Freq: Once | INTRAVENOUS | Status: AC
  Administered 2016-12-30: 10 mg via INTRAVENOUS

## 2016-12-30 MED ORDER — FENTANYL CITRATE (PF) 100 MCG/2ML IJ SOLN
INTRAMUSCULAR | Status: DC | PRN
  Administered 2016-12-30 (×5): 50 ug via INTRAVENOUS

## 2016-12-30 MED ORDER — TECHNETIUM TC 99M DIETHYLENETRIAME-PENTAACETIC ACID
39.2100 | Freq: Once | INTRAVENOUS | Status: AC | PRN
  Administered 2016-12-30: 39.21 via INTRAVENOUS

## 2016-12-30 MED ORDER — MIDAZOLAM HCL 2 MG/2ML IJ SOLN
INTRAMUSCULAR | Status: DC | PRN
  Administered 2016-12-30 (×3): 1 mg via INTRAVENOUS

## 2016-12-30 MED ORDER — STERILE WATER FOR INJECTION IJ SOLN
INTRAMUSCULAR | Status: AC
  Filled 2016-12-30: qty 20

## 2016-12-30 MED ORDER — LABETALOL HCL 5 MG/ML IV SOLN
INTRAVENOUS | Status: AC
  Filled 2016-12-30: qty 4

## 2016-12-30 MED ORDER — CARVEDILOL 6.25 MG PO TABS
6.2500 mg | ORAL_TABLET | Freq: Two times a day (BID) | ORAL | Status: DC
  Filled 2016-12-30 (×2): qty 1

## 2016-12-30 MED ORDER — IPRATROPIUM-ALBUTEROL 0.5-2.5 (3) MG/3ML IN SOLN
RESPIRATORY_TRACT | Status: AC
  Filled 2016-12-30: qty 3

## 2016-12-30 MED ORDER — LIDOCAINE HCL (PF) 1 % IJ SOLN
INTRAMUSCULAR | Status: AC
  Filled 2016-12-30: qty 30

## 2016-12-30 MED ORDER — TECHNETIUM TO 99M ALBUMIN AGGREGATED
5.0000 | Freq: Once | INTRAVENOUS | Status: AC | PRN
  Administered 2016-12-30: 5 via INTRAVENOUS

## 2016-12-30 MED ORDER — CARVEDILOL 6.25 MG PO TABS
6.2500 mg | ORAL_TABLET | ORAL | Status: AC
  Administered 2016-12-30: 6.25 mg via ORAL
  Filled 2016-12-30: qty 1

## 2016-12-30 MED ORDER — ATORVASTATIN CALCIUM 20 MG PO TABS
20.0000 mg | ORAL_TABLET | Freq: Every day | ORAL | Status: DC
  Administered 2016-12-30: 20 mg via ORAL
  Filled 2016-12-30: qty 1

## 2016-12-30 MED ORDER — IPRATROPIUM-ALBUTEROL 0.5-2.5 (3) MG/3ML IN SOLN
3.0000 mL | Freq: Once | RESPIRATORY_TRACT | Status: AC
  Administered 2016-12-30: 3 mL via RESPIRATORY_TRACT

## 2016-12-30 MED ORDER — HEPARIN SODIUM (PORCINE) 1000 UNIT/ML IJ SOLN
INTRAMUSCULAR | Status: AC
  Filled 2016-12-30: qty 1

## 2016-12-30 MED ORDER — IPRATROPIUM-ALBUTEROL 0.5-2.5 (3) MG/3ML IN SOLN: 3.0000 mL | Freq: Four times a day (QID) | RESPIRATORY_TRACT | Status: DC

## 2016-12-30 SURGICAL SUPPLY — 21 items
BALLN ARMADA 12X80X80 (BALLOONS) ×4
BALLN DORADO 9X80X80 (BALLOONS) ×6
BALLOON ARMADA 12X80X80 (BALLOONS) ×2 IMPLANT
BALLOON DORADO 9X80X80 (BALLOONS) ×3 IMPLANT
CANISTER PENUMBRA MAX (MISCELLANEOUS) ×2 IMPLANT
CATH BEACON 5 .035 65 KMP TIP (CATHETERS) ×2 IMPLANT
CATH BEACON 5 .038 100 VERT TP (CATHETERS) ×2 IMPLANT
CATH INDIGO 8 TORQ TIP 85CM (CATHETERS) ×2 IMPLANT
CATH INDIGO SEP 8 (CATHETERS) ×2 IMPLANT
COVER PROBE U/S 5X48 (MISCELLANEOUS) ×2 IMPLANT
DEVICE PRESTO INFLATION (MISCELLANEOUS) ×4 IMPLANT
DEVICE TORQUE (MISCELLANEOUS) ×2 IMPLANT
GLIDEWIRE STIFF .35X180X3 HYDR (WIRE) ×2 IMPLANT
NEEDLE ENTRY 21GA 7CM ECHOTIP (NEEDLE) ×2 IMPLANT
PACK ANGIOGRAPHY (CUSTOM PROCEDURE TRAY) ×2 IMPLANT
SET INTRO CAPELLA COAXIAL (SET/KITS/TRAYS/PACK) ×2 IMPLANT
SHEATH BRITE TIP 8FRX11 (SHEATH) ×4 IMPLANT
SHIELD X-DRAPE GOLD 12X17 (MISCELLANEOUS) ×4 IMPLANT
TUBING ASPIRATION INDIGO (MISCELLANEOUS) ×2 IMPLANT
WIRE J 3MM .035X145CM (WIRE) ×2 IMPLANT
WIRE MAGIC TOR.035 180C (WIRE) ×4 IMPLANT

## 2016-12-30 NOTE — Progress Notes (Signed)
Will give patient ordered "stat" medications and request he sign his consent form for today's procedure when patient returns from his Lung VQ scan. Wenda Low [facility], [place]

## 2016-12-30 NOTE — Op Note (Signed)
[facility]                                                                               OPERATIVE NOTE    PRE-OPERATIVE DIAGNOSIS: Symptomatic bilateral leg DVT  POST-OPERATIVE DIAGNOSIS: Same  PROCEDURE: 1. Ultrasound guidance for vascular access to the bilateral  popliteal vein 2. first order venous catheter placement from the right popliteal approach and first order venous catheter placement from the left popliteal approach 3. Inferior venacavogram 4. venography bilateral lower extremities 5. Mechanical thrombectomy of the inferior vena cava, left common iliac vein, left external iliac vein and left common femoral vein using the cat 8 penumbra device 6. Mechanical thrombectomy of the inferior vena cava, right common iliac vein, right external iliac vein and right common femoral vein using the cat 8 penumbra device 7. Infusion thrombolysis with 12 mg of TPA 8.   Percutaneous transluminal angioplasty to 12 mm right common iliac vein, right external iliac vein and right common femoral vein 9.    Percutaneous transluminal and plasty to 12 mm left common iliac vein, left external iliac vein and left common femoral vein. 10.   Percutaneous transluminal angioplasty to 18 mm inferior vena cava  SURGEON: Hortencia Pilar  ASSISTANT(S): None  ANESTHESIA: Conscious sedation was administered by the interventional radiology RN under my direct supervision. IV Versed plus fentanyl were utilized. Continuous ECG, pulse oximetry and blood pressure was monitored throughout the entire procedure.  Conscious sedation was administered for a total of 125 minutes.  ESTIMATED BLOOD LOSS: minimal  Contrast: 40 cc  Fluoroscopy time: 18.6 minutes  FINDING(S): 1.thrombus within IVC; thrombus within bilateral common iliac veins, bilateral external iliac veins and bilateral common femoral  vein  SPECIMEN(S): none  INDICATIONS:  [name] is a 76 y.o. year old male who presents with massive swelling of the bilateral lower extremities that is quite painful. Inferior vena cava filter is already in place. Risks and benefits were reviewed all questions were answered patient has agreed to proceed.   DESCRIPTION: After obtaining full informed written consent, the patient was brought back to the vascular suite. The skin was sterilely prepped and draped in a sterile surgical field was created.  The patient was then positioned to the prone and the popliteal fossa of both legs was prepped and draped in a sterile fashion. Ultrasound was placed in a sterile sleeve. Ultrasound is utilized to gain access to the popliteal vein. Both popliteal veins were identified they were echolucent and compressible indicating they were patent. 1% lidocaine is infiltrated in soft tissues and subsequent microneedle is inserted into the popliteal vein without difficulty. Images recorded for the permanent record and the puncture is made under real-time visualization. Microwire is then advanced under fluoroscopic guidance and noted to follow the path of the superficial femoral  vein. Therefore the micro-sheath was placed followed by a Magic torque wire and subsequently an 8 [place] sheath.  This process was performed first on the left and then on the right in identical fashion.  KMP catheter together with the stiff angled glide wire then advanced up to the level of the common iliac and hand injection contrast is utilized to demonstrate that the common iliac veins nd the inferior vena cava is full thrombus. Catheter is then repositioned to the common femoral level and hand injection contrast demonstrates that there is occlusive thrombus within the common femoral veins bilaterally. Repositioning of the KMP catheter then demonstrated no thrombus within the superficial femoral vein and popliteal vein.  Penumbra cat 6  catheter is then prepped on the field and 12 mg of TPA is reconstituted 100 cc. This is then laced throughout the IVC and iliac as well as the common femoral veins bilaterally. The TPA is then allowed to dwell. Subsequently, the a number catheter is engaged in the aspiration mode and aspiration of the IVC, common iliac and external iliac as well as the common femoral veins is performed multiple passes are madeup both the right and left sheaths with the volumes recorded in the procedure notes.  Follow-up imaging now demonstrates that a large portion of the thrombus is been eliminated from the iliac and femoral veins. There does appear to be a abnormality of the vein such as a stricture. Within the iliac veins as well as the common femoral veinsbilaterally there is a high-grade residual narrowing and therefore initially a 9 x 6 balloon was inflated to 6 atm for 1 minute and subsequently a 12 x 6 balloon is inflated to 6 atm for 1 minute. Follow-up imaging demonstrates there is now flow although there is moderate residual stenosis there is now clearly a adequate venous channel with less than 30% residual stenosis in the iliac and femoral veins bilaterally. The inferior vena cava is now patent with modest residual stenosis in its inferior portion the filter has been completely cleared of thrombus.  Given the significant improvement in flow status post thrombectomy and angioplasty I elected to terminate the procedure.The wires areremoved the sheaths are removed pressures held and a pressure dressing with Coban and is then applied. The patient is then returned to the supine position  Interpretation: Initial images demonstrated thrombus within the vena cava filter, IVC proper, bilateral common and external iliac veins and bilateral common femoral veins. Following mechanical thrombectomy and angioplasty there is now recanalization of the system with modest residual stenosis noted. There is dramatic improvement in  flow. Following intervention described above there is significant resolution of thrombus throughout.  COMPLICATIONS: None  CONDITION: Stable  Hortencia Pilar  12/30/2016,7:37 PM

## 2016-12-30 NOTE — Progress Notes (Signed)
*  PRELIMINARY RESULTS* Echocardiogram 2D Echocardiogram has been performed.  Sherrie Sport 12/30/2016, 9:58 AM

## 2016-12-30 NOTE — Progress Notes (Signed)
[facility] NAME: [name]    MR#:  [id number]  DATE OF BIRTH:  Sep 03, 1940  SUBJECTIVE:   Pt. Here due to b/l Lower Ext. Extensive DVT.  Awaiting Vascular input for possible thrombolysis and thrombectomy today.  REVIEW OF SYSTEMS:    Review of Systems  Constitutional: Negative for chills and fever.  HENT: Negative for congestion and tinnitus.   Eyes: Negative for blurred vision and double vision.  Respiratory: Positive for shortness of breath. Negative for cough and wheezing.   Cardiovascular: Positive for leg swelling. Negative for chest pain, orthopnea and PND.  Gastrointestinal: Negative for abdominal pain, diarrhea, nausea and vomiting.  Genitourinary: Negative for dysuria and hematuria.  Neurological: Negative for dizziness, sensory change and focal weakness.  All other systems reviewed and are negative.   Nutrition: NPO Tolerating Diet: No Tolerating PT: Await Eval.   DRUG ALLERGIES:   Allergies  Allergen Reactions  . Plavix [Clopidogrel Bisulfate]     Brain hemorrhage prev while on plavix and aspirin    VITALS:  Blood pressure 129/61, pulse 82, temperature 98.6 F (37 C), temperature source Oral, resp. rate (!) 29, height 5\' 9"  (1.753 m), weight 79.8 kg (176 lb), SpO2 96 %.  PHYSICAL EXAMINATION:   Physical Exam  GENERAL:  76 y.o.-year-old patient lying in bed in mild Resp. distress.  EYES: Pupils equal, round, reactive to light and accommodation. No scleral icterus. Extraocular muscles intact.  HEENT: Head atraumatic, normocephalic. Oropharynx and nasopharynx clear.  NECK:  Supple, no jugular venous distention. No thyroid enlargement, no tenderness.  LUNGS: Normal breath sounds bilaterally, no wheezing, rales, rhonchi. No use of accessory muscles of respiration.  CARDIOVASCULAR: S1, S2 normal. No murmurs, rubs, or gallops.  ABDOMEN: Soft, nontender, nondistended. Bowel sounds present. No organomegaly or  mass.  EXTREMITIES: No cyanosis, clubbing, +1-2 edema b/l.     NEUROLOGIC: Cranial nerves II through XII are intact. No focal Motor or sensory deficits b/l.   PSYCHIATRIC: The patient is alert and oriented x 3.  SKIN: No obvious rash, lesion, or ulcer.    LABORATORY PANEL:   CBC  Recent Labs Lab 12/30/16 0103  WBC 9.5  HGB 13.4  HCT 38.9*  PLT 167   ------------------------------------------------------------------------------------------------------------------  Chemistries   Recent Labs Lab 12/29/16 1615 12/30/16 0103  NA 136 136  K 4.8 4.3  CL 101 105  CO2 24 25  GLUCOSE 118* 109*  BUN 26* 25*  CREATININE 1.36* 1.33*  CALCIUM 9.5 8.6*  AST 20  --   ALT 12*  --   ALKPHOS 82  --   BILITOT 1.5*  --    ------------------------------------------------------------------------------------------------------------------  Cardiac Enzymes  Recent Labs Lab 12/24/16 1817  TROPONINI <0.03   ------------------------------------------------------------------------------------------------------------------  RADIOLOGY:  Dg Chest 2 View  Result Date: 12/29/2016 CLINICAL DATA:  History of DVT. Bilateral leg pain and shortness of breath. EXAM: CHEST  2 VIEW COMPARISON:  December 22, 2016 FINDINGS: No pneumothorax. Vascular stents again seen on the left. A new nodular density over the lateral left lung base is favored to represent a nipple shadow. Blunting of the left costophrenic angle may represent a small effusion versus pleural thickening. The heart, hila, and mediastinum are unchanged. No other changes within the lungs. IMPRESSION: 1. Nodular density over the lateral left lung base is favored to represent a nipple shadow. Recommend repeat imaging with nipple markers before discharge. 2. Pleural thickening versus small effusion on the left. 3. No other  acute abnormalities. Electronically Signed   By: Dorise Bullion III M.D   On: 12/29/2016 18:41   US Venous Img Lower  Bilateral  Result Date: 12/28/2016 CLINICAL DATA:  Leg swelling and pain for 5 days EXAM: BILATERAL LOWER EXTREMITY VENOUS DOPPLER ULTRASOUND TECHNIQUE: Gray-scale sonography with graded compression, as well as color Doppler and duplex ultrasound were performed to evaluate the lower extremity deep venous systems from the level of the common femoral vein and including the common femoral, femoral, profunda femoral, popliteal and calf veins including the posterior tibial, peroneal and gastrocnemius veins when visible. The superficial great saphenous vein was also interrogated. Spectral Doppler was utilized to evaluate flow at rest and with distal augmentation maneuvers in the common femoral, femoral and popliteal veins. COMPARISON:  07/27/2012 FINDINGS: RIGHT LOWER EXTREMITY Common Femoral Vein: Noncompressible with occlusive thrombus present. Saphenofemoral Junction: Noncompressible with occlusive thrombus present. Profunda Femoral Vein: Noncompressible with occlusive thrombus present. Femoral Vein: Noncompressible with occlusive thrombus present. Popliteal Vein: No evidence of thrombus. Normal compressibility, respiratory phasicity and response to augmentation. Calf Veins: No evidence of thrombus. Normal compressibility and flow on color Doppler imaging. LEFT LOWER EXTREMITY Common Femoral Vein: Noncompressible with occlusive thrombus present. Saphenofemoral Junction: Noncompressible with occlusive thrombus present. Profunda Femoral Vein: Noncompressible with occlusive thrombus present Femoral Vein: No evidence of thrombus. Normal compressibility, respiratory phasicity and response to augmentation. Popliteal Vein: Noncompressible with occlusive thrombus present. Calf Veins: No evidence of thrombus. Normal compressibility and flow on color Doppler imaging. IMPRESSION: 1. Extensive occlusive thrombus within the right common femoral and femoral veins with extension of occlusive thrombus into the saphenous femoral  junction and image profunda vein. Deep veins are patent from the popliteal vein to the calf. 2. Positive for left lower extremity DVT with occlusive thrombus present in the left common femoral vein an visualized portions of the staff in all femoral junction and profunda vein. Occlusive thrombus also present within the left popliteal vein. Left femoral vein and calf veins appear patent. Critical Value/emergent results were called by telephone at the time of interpretation on 12/28/2016 at 4:35 pm to Dr. Rockey Situ who was covering for [facility] , who verbally acknowledged these results. Pt reportedly has IVC filter in place. Electronically Signed   By: Donavan Foil M.D.   On: 12/28/2016 16:36     ASSESSMENT AND PLAN:   76 yo male history of peripheral vascular disease with history of abdominal aortic aneurysm, hypertension, hypothyroidism, hyperlipidemia, history of CHF, history of coronary artery disease who presents to the hospital due to significant lower extremity edema and noted to have extensive b/l DVT.   1. Bilateral extensive DVT-patient has a previous history of DVT status post IVC filter 4 years ago. Now presents with new acute DVT. Continue heparin drip for now. -Await vascular surgery input for possible thrombolysis today. -Patient having some shortness of breath and awaiting VQ scan to rule out pulmonary embolism. Cannot do CT chest given patient's chronic kidney disease.  2. Shortness of breath - etiology unclear ?? Related to possible PE given extensive DVT.  - await V/Q results.  Cont. Heparin gtt.   3. Hx of CAD - no acute chest pain.  - cont. Coreg, Norvasc.   4. Hyperlipidemia - cont. Atorvastatin  5. Hypothyroidism - cont. Synthroid.    All the records are reviewed and case discussed with Care Management/Social Worker. Management plans discussed with the patient, family and they are in agreement.  CODE STATUS: Full code  DVT Prophylaxis: Heparin gtt  TOTAL TIME  TAKING CARE OF THIS PATIENT: 30 minutes.   POSSIBLE D/C IN 1-2 DAYS, DEPENDING ON CLINICAL CONDITION.   Henreitta Leber M.D on 12/30/2016 at 2:11 PM  Between 7am to 6pm - Pager - [phone]  After 6pm go to [email or web address] - Proofreader  [facility]  Office  [phone]  CC: Primary care physician; Owens Loffler, MD

## 2016-12-30 NOTE — Progress Notes (Signed)
ANTICOAGULATION CONSULT NOTE - Initial Consult  Pharmacy Consult for Heparin  Indication: VTE   Allergies  Allergen Reactions  . Plavix [Clopidogrel Bisulfate]     Brain hemorrhage prev while on plavix and aspirin    Patient Measurements: Height: 5\' 9"  (175.3 cm) Weight: 176 lb (79.8 kg) IBW/kg (Calculated) : 70.7 Heparin Dosing Weight: 79.8  Vital Signs: Temp: 97.7 F (36.5 C) (09/06 2011) Temp Source: Oral (09/06 2011) BP: 146/53 (09/06 2011) Pulse Rate: 91 (09/06 2011)  Labs:  Recent Labs  12/29/16 1615 12/30/16 0103  HGB 15.0 13.4  HCT 44.8 38.9*  PLT 196 167  APTT 38* 119*  LABPROT 20.1*  --   INR 1.73  --   HEPARINUNFRC >3.60* >3.60*  CREATININE 1.36* 1.33*    Estimated Creatinine Clearance: 48 mL/min (A) (by C-G formula based on SCr of 1.33 mg/dL (H)).   Medical History: Past Medical History:  Diagnosis Date  . AAA (abdominal aortic aneurysm) ([place])   . Aortic calcification ([place]) 05/01/2013  . Arthritis    "hands; not dx'd" (12/26/2012)  . Bilateral renal artery stenosis ([place])    a. 06/2013 Angio/PTA: LRA: 95 (5x18 Herculink stent), RRA 60ost, celiac/SMA nl, RCIA patent stents extending into dist Ao, RIIA 70ost, REIA min irregs, LCIA patent stent, LIIA 60-70ost, LEIA min irregs.  . CHF (congestive heart failure) ([place])    Patient states he doesn't have CHF anymore per cardiologist.  . Coronary artery disease   . Hypertension   . Hypothyroidism   . Intraventricular hemorrhage ([place]) 07/12/2012  . Peripheral arterial disease ([place])    a. Previous left lower extremity stenting by Dr. Jamal Collin;  b. 12/2012 s/p bilat ostial common iliac stenting.  . Pneumonia   . Right leg DVT ([place]) 08/13/2012  . Subclavian artery stenosis, left ([place]) 05/2012   Status post stenting of the ostium and self-expanding stent placement to the left axillary artery  . Tobacco abuse    d/c 2012  . Venous insufficiency     Medications:  Prescriptions Prior to Admission  Medication Sig  Dispense Refill Last Dose  . amLODipine (NORVASC) 5 MG tablet Take 5 mg by mouth daily.   12/29/2016 at am  . apixaban (ELIQUIS) 5 MG TABS tablet Take 10 mg by mouth 2 (two) times daily. After 7 days decrease to 5 mg twice a day   12/29/2016 at 1000  . atorvastatin (LIPITOR) 20 MG tablet Take 20 mg by mouth at bedtime.   12/28/2016 at pm  . carvedilol (COREG) 6.25 MG tablet Take 6.25 mg by mouth 2 (two) times daily with a meal.   12/29/2016 at 1000  . levothyroxine (SYNTHROID, LEVOTHROID) 150 MCG tablet Take 150 mcg by mouth daily before breakfast.   12/29/2016 at am   Scheduled:  . sodium chloride flush  3 mL Intravenous Q12H   Infusions:  . heparin 1,200 Units/hr (12/29/16 1734)    Assessment: Pharmacy consulted to dose and monitor Heparin gtt in this 76 year old male with VTE. Patient on apixaban as an outpatient.Spoke with patient and patient states last dose of eliquis was this morning @ ~10:00.  Goal of Therapy:  Heparin level 0.3-0.7 units/ml Monitor platelets by anticoagulation protocol: Yes   Plan:  No bolus warranted @ this time.  Start heparin infusion at 1200 units/hr Check anti-Xa level in 8 hours and daily while on heparin Continue to monitor H&H and platelets  Will need to monitor APTT and HL until levels within goal range.   09/07  0100 heparin level >3.6, aPTT 119. Decrease rate to 1100 units/hr and recheck aPTT in 6 hours.  Sim Boast, PharmD, BCPS  12/30/16 3:38 AM      Sanaia Jasso S 12/30/2016,3:37 AM

## 2016-12-30 NOTE — Progress Notes (Signed)
Pt recovery started in IR procedural area at 1905. Bilateral legs wrapped per IR Tech, Bilat popliteal sites CDI. Feet and legs mottled. MD at bedside made aware. Pt with upper airway wheezing, 02 sats 95-100 on 2 L [place]. Duo Neb ordered and given. As of 1945 Pt respirations improved, WNL no wheezing post NEB treatment

## 2016-12-30 NOTE — Consult Note (Signed)
[facility] Consult Note  MRN : [id number]  [name] is a 76 y.o. (1940/11/10) male who presents with chief complaint of  Chief Complaint  Patient presents with  . Leg Pain  .  History of Present Illness: I am asked to evaluate the patient by D. Sudini.  The patient is a 76 year old gentleman with a past medical history of right leg DVT. This occurred approximate 4 years ago at which time an intracranial bleed was identified and therefore he could not be anticoagulated. IVC filter was placed. He presented to Dr. Ruben Gottron office yesterday with a complaint of increased pain and swelling of both lower extremities. He rated the pain as an 8 out of 10. He denied shortness of breath or pleuritic chest pain.  On evaluation Dr. Velva Harman did not feel he was in congestive heart failure and sent him for a stat venous ultrasound.This revealed bilateral DVT up to and including the common femoral veins.  He was subsequently admitted and started on a heparin drip.  Current Facility-Administered Medications  Medication Dose Route Frequency Provider Last Rate Last Dose  . acetaminophen (TYLENOL) tablet 650 mg  650 mg Oral Q6H PRN Hillary Bow, MD       Or  . acetaminophen (TYLENOL) suppository 650 mg  650 mg Rectal Q6H PRN Sudini, Alveta Heimlich, MD      . albuterol (PROVENTIL) (2.5 MG/3ML) 0.083% nebulizer solution 2.5 mg  2.5 mg Nebulization Q2H PRN Hillary Bow, MD      . Derrill Memo ON 12/31/2016] amLODipine (NORVASC) tablet 5 mg  5 mg Oral Daily Schnier, Dolores Lory, MD      . atorvastatin (LIPITOR) tablet 20 mg  20 mg Oral QHS Henreitta Leber, MD      . carvedilol (COREG) tablet 6.25 mg  6.25 mg Oral BID WC Schnier, Dolores Lory, MD      . heparin ADULT infusion 100 units/mL (25000 units/264mL sodium chloride 0.45%)  1,050 Units/hr Intravenous Continuous Napoleon Form, RPH 10.5 mL/hr at 12/30/16 1435 1,050 Units/hr at 12/30/16 1435  . [START ON 12/31/2016] levothyroxine (SYNTHROID,  LEVOTHROID) tablet 150 mcg  150 mcg Oral QAC breakfast Sainani, Belia Heman, MD      . ondansetron [facility]) tablet 4 mg  4 mg Oral Q6H PRN Hillary Bow, MD       Or  . ondansetron (ZOFRAN) injection 4 mg  4 mg Intravenous Q6H PRN Sudini, Srikar, MD      . polyethylene glycol (MIRALAX / GLYCOLAX) packet 17 g  17 g Oral Daily PRN Sudini, Srikar, MD      . sodium chloride flush (NS) 0.9 % injection 3 mL  3 mL Intravenous Q12H Hillary Bow, MD   3 mL at 12/30/16 0914  . traMADol (ULTRAM) tablet 50 mg  50 mg Oral Q6H PRN Hillary Bow, MD        Past Medical History:  Diagnosis Date  . AAA (abdominal aortic aneurysm) ([place])   . Aortic calcification ([place]) 05/01/2013  . Arthritis    "hands; not dx'd" (12/26/2012)  . Bilateral renal artery stenosis ([place])    a. 06/2013 Angio/PTA: LRA: 95 (5x18 Herculink stent), RRA 60ost, celiac/SMA nl, RCIA patent stents extending into dist Ao, RIIA 70ost, REIA min irregs, LCIA patent stent, LIIA 60-70ost, LEIA min irregs.  . CHF (congestive heart failure) ([place])    Patient states he doesn't have CHF anymore per cardiologist.  . Coronary artery disease   . Hypertension   . Hypothyroidism   .  Intraventricular hemorrhage ([place]) 07/12/2012  . Peripheral arterial disease ([place])    a. Previous left lower extremity stenting by Dr. Jamal Collin;  b. 12/2012 s/p bilat ostial common iliac stenting.  . Pneumonia   . Right leg DVT ([place]) 08/13/2012  . Subclavian artery stenosis, left ([place]) 05/2012   Status post stenting of the ostium and self-expanding stent placement to the left axillary artery  . Tobacco abuse    d/c 2012  . Venous insufficiency     Past Surgical History:  Procedure Laterality Date  . ABDOMINAL ANGIOGRAM  07/10/2013   WITH BI-FEMORAL RUNOFF       DR ARIDA  . ABDOMINAL AORTAGRAM N/A 12/26/2012   Procedure: ABDOMINAL Maxcine Ham;  Surgeon: Wellington Hampshire, MD;  Location: [facility];  Service: Cardiovascular;  Laterality: N/A;  . ABDOMINAL AORTAGRAM N/A 07/10/2013    Procedure: ABDOMINAL Maxcine Ham;  Surgeon: Wellington Hampshire, MD;  Location: [facility];  Service: Cardiovascular;  Laterality: N/A;  . ANGIOPLASTY / STENTING FEMORAL  05/2012  . ANGIOPLASTY / STENTING ILIAC Bilateral 12/26/2012  . ARCH AORTOGRAM N/A 06/20/2012   Procedure: ARCH AORTOGRAM;  Surgeon: Wellington Hampshire, MD;  Location: [facility];  Service: Cardiovascular;  Laterality: N/A;  . CARDIAC CATHETERIZATION  2/14   [facility]  . CHOLECYSTECTOMY  1990's  . CORONARY ARTERY BYPASS GRAFT  07/11/2011   Procedure: CORONARY ARTERY BYPASS GRAFTING (CABG);  Surgeon: Grace Isaac, MD;  Location: [facility];  Service: Open Heart Surgery;  Laterality: N/A;  Times 3. On Pump. Using right greater saphenous vein and left internal mammary artery.   Marland Kitchen HERNIA REPAIR    . INSERTION OF ILIAC STENT Bilateral 12/26/2012   Procedure: INSERTION OF ILIAC STENT;  Surgeon: Wellington Hampshire, MD;  Location: [facility] CATH LAB;  Service: Cardiovascular;  Laterality: Bilateral;  Bilateral Common Iliac Artery  . LEFT AND RIGHT HEART CATHETERIZATION WITH CORONARY ANGIOGRAM  07/07/2011   Procedure: LEFT AND RIGHT HEART CATHETERIZATION WITH CORONARY ANGIOGRAM;  Surgeon: Minna Merritts, MD;  Location: [facility] C[facility] LAB;  Service: Cardiovascular;;  . PERCUTANEOUS STENT INTERVENTION  06/20/2012   Procedure: PERCUTANEOUS STENT INTERVENTION;  Surgeon: Wellington Hampshire, MD;  Location: [facility];  Service: Cardiovascular;;  . RENAL ARTERY ANGIOPLASTY Left 07/10/2013   DR ARIDA  . SUBCLAVIAN ARTERY STENT  05/2012   "2 stents" (12/26/2012)  . TOOTH EXTRACTION  Spring 2017   mulitple (6)  . VENA CAVA FILTER PLACEMENT  07/2012   Removable    Social History Social History  Substance Use Topics  . Smoking status: Former Smoker    Packs/day: 1.00    Years: 50.00    Types: Cigarettes    Quit date: 12/25/2010  . Smokeless tobacco: Never Used  . Alcohol use No    Family History Family History  Problem Relation Age of Onset  . Alcohol abuse Father   .  Cirrhosis Father   . Hypothyroidism Sister   No family history of bleeding/clotting disorders, porphyria or autoimmune disease   Allergies  Allergen Reactions  . Plavix [Clopidogrel Bisulfate]     Brain hemorrhage prev while on plavix and aspirin     REVIEW OF SYSTEMS (Negative unless checked)  Constitutional: [] Weight loss  [] Fever  [] Chills Cardiac: [] Chest pain   [] Chest pressure   [] Palpitations   [] Shortness of breath when laying flat   [] Shortness of breath at rest   [x] Shortness of breath with exertion.  Vascular:  [] Pain in legs with walking   []   Pain in legs at rest   [x] Pain in legs when laying flat   [] Claudication   [] Pain in feet when walking  [] Pain in feet at rest  [] Pain in feet when laying flat   [x] History of DVT   [] Phlebitis   [x] Swelling in legs   [] Varicose veins   [] Non-healing ulcers Pulmonary:   [] Uses home oxygen   [] Productive cough   [] Hemoptysis   [] Wheeze  [] COPD   [] Asthma Neurologic:  [] Dizziness  [] Blackouts   [] Seizures   [] History of stroke   [] History of TIA  [] Aphasia   [] Temporary blindness   [] Dysphagia   [] Weakness or numbness in arms   [] Weakness or numbness in legs Musculoskeletal:  [] Arthritis   [x] Joint swelling   [] Joint pain   [] Low back pain Hematologic:  [] Easy bruising  [] Easy bleeding   [] Hypercoagulable state   [] Anemic  [] Hepatitis Gastrointestinal:  [] Blood in stool   [] Vomiting blood  [] Gastroesophageal reflux/heartburn   [] Difficulty swallowing. Genitourinary:  [x] Chronic kidney disease   [] Difficult urination  [] Frequent urination  [] Burning with urination   [] Blood in urine Skin:  [] Rashes   [] Ulcers   [] Wounds Psychological:  [] History of anxiety   []  History of major depression.  Physical Examination  Vitals:   12/29/16 2011 12/30/16 0444 12/30/16 0837 12/30/16 1209  BP: (!) 146/53 (!) 116/54 (!) 126/54 129/61  Pulse: 91 87 82 82  Resp:      Temp: 97.7 F (36.5 C) 99 F (37.2 C) 98.1 F (36.7 C) 98.6 F (37 C)  TempSrc:  Oral Oral Oral Oral  SpO2: 97% 97% 97% 96%  Weight:      Height:       Body mass index is 25.99 kg/m. Gen:  WD/WN, laying flat in bed with moderate distress Head: [place]/AT, No temporalis wasting. Prominent temp pulse not noted. Ear/Nose/Throat: Hearing grossly intact, nares w/o erythema or drainage, oropharynx w/o Erythema/Exudate Eyes: Sclera non-icteric, conjunctiva clear Neck: Trachea midline.  No JVD.  Pulmonary:  Good air movement, respirations not labored, equal bilaterally.  Cardiac: RRR, normal S1, S2. Vascular: Large varicosities present extensively greater than 10 mm bilaterally.  severe venous stasis changes to the legs bilaterally.  4+ soft pitting edema Vessel Right Left  Radial Palpable Palpable  PT Palpable Palpable  DP Palpable Palpable  Gastrointestinal: soft, non-tender/non-distended. No guarding/reflex.  Musculoskeletal: M/S 5/5 throughout.  Extremities without ischemic changes.  No deformity or atrophy. No edema. Neurologic: Sensation grossly intact in extremities.  Symmetrical.  Speech is fluent. Motor exam as listed above. Psychiatric: Judgment intact, Mood & affect appropriate for pt's clinical situation. Dermatologic: venous rashes no ulcers noted.  No cellulitis or open wounds. Lymph : No Cervical, Axillary, or Inguinal lymphadenopathy.      CBC Lab Results  Component Value Date   WBC 9.5 12/30/2016   HGB 13.4 12/30/2016   HCT 38.9 (L) 12/30/2016   MCV 87.0 12/30/2016   PLT 167 12/30/2016    BMET    Component Value Date/Time   NA 136 12/30/2016 0103   NA 140 07/28/2015 1006   NA 136 06/03/2013 0454   K 4.3 12/30/2016 0103   K 4.1 06/03/2013 0454   CL 105 12/30/2016 0103   CL 106 06/03/2013 0454   CO2 25 12/30/2016 0103   CO2 27 06/03/2013 0454   GLUCOSE 109 (H) 12/30/2016 0103   GLUCOSE 80 06/03/2013 0454   BUN 25 (H) 12/30/2016 0103   BUN 18 07/28/2015 1006   BUN 22 (H) 06/03/2013  0454   CREATININE 1.33 (H) 12/30/2016 0103   CREATININE  1.59 (H) 06/03/2013 0454   CREATININE 1.02 04/22/2011 1507   CALCIUM 8.6 (L) 12/30/2016 0103   CALCIUM 8.8 06/03/2013 0454   GFRNONAA 51 (L) 12/30/2016 0103   GFRNONAA 43 (L) 06/03/2013 0454   GFRAA 59 (L) 12/30/2016 0103   GFRAA 50 (L) 06/03/2013 0454   Estimated Creatinine Clearance: 48 mL/min (A) (by C-G formula based on SCr of 1.33 mg/dL (H)).  COAG Lab Results  Component Value Date   INR 1.73 12/29/2016   INR 1.2 06/27/2013   INR 1.3 06/02/2013    Radiology Dg Chest 2 View  Result Date: 12/29/2016 CLINICAL DATA:  History of DVT. Bilateral leg pain and shortness of breath. EXAM: CHEST  2 VIEW COMPARISON:  December 22, 2016 FINDINGS: No pneumothorax. Vascular stents again seen on the left. A new nodular density over the lateral left lung base is favored to represent a nipple shadow. Blunting of the left costophrenic angle may represent a small effusion versus pleural thickening. The heart, hila, and mediastinum are unchanged. No other changes within the lungs. IMPRESSION: 1. Nodular density over the lateral left lung base is favored to represent a nipple shadow. Recommend repeat imaging with nipple markers before discharge. 2. Pleural thickening versus small effusion on the left. 3. No other acute abnormalities. Electronically Signed   By: Dorise Bullion III M.D   On: 12/29/2016 18:41   Dg Chest 2 View  Result Date: 12/22/2016 CLINICAL DATA:  Near syncopal episode pre seated dive dizziness and nausea. No loss consciousness but the patient was not responding to his wife. History of coronary artery disease and CABG, abdominal aortic aneurysm and intracranial aneurysm. History of PVD with inferior vena caval stent placement. Former smoker. EXAM: CHEST  2 VIEW COMPARISON:  Portable chest x-ray of June 02, 2013 and PA and lateral chest x-ray of May 28, 2013. FINDINGS: The lungs are adequately inflated. The interstitial markings are mildly prominent though stable. There is stable blunting  of the left lateral costophrenic angle. There is no alveolar edema. There is no significant pleural effusion. The heart and pulmonary vascularity are normal. There is calcification in the wall of the aortic arch. The sternal wires are intact. The bony thorax exhibits no acute abnormality. IMPRESSION: Chronic bronchitic changes, stable. No pneumonia, CHF, nor other acute cardiopulmonary abnormality. Previous CABG, thoracic aortic atherosclerosis. Electronically Signed   By: Zared  Martinique M.D.   On: 12/22/2016 12:07   Ct Head Wo Contrast  Result Date: 12/22/2016 CLINICAL DATA:  Syncope. EXAM: CT HEAD WITHOUT CONTRAST TECHNIQUE: Contiguous axial images were obtained from the base of the skull through the vertex without intravenous contrast. COMPARISON:  CT head dated July 15, 2012. FINDINGS: Brain: No evidence of acute infarction, hemorrhage, hydrocephalus, extra-axial collection or mass lesion/mass effect. Age-related cerebral atrophy with compensatory dilatation of the ventricles. Mild periventricular white matter and corona radiata hypodensities favor chronic ischemic microvascular white matter disease. Vascular: Atherosclerotic vascular calcification of the carotid siphons. No hyperdense vessel. Skull: Normal. Negative for fracture or focal lesion. Sinuses/Orbits: The bilateral paranasal sinuses and mastoid air cells are clear. The orbits are unremarkable. Other: None. IMPRESSION: No acute intracranial abnormality. Electronically Signed   By: Titus Dubin M.D.   On: 12/22/2016 12:18   Mr Jeri Cos And Wo Contrast  Result Date: 12/24/2016 CLINICAL DATA:  76 y/o M; periodic dizziness and loss of dexterity in the hands for 2 days. EXAM: MRI HEAD WITHOUT AND  WITH CONTRAST TECHNIQUE: Multiplanar, multiecho pulse sequences of the brain and surrounding structures were obtained without and with intravenous contrast. CONTRAST:  66mL MULTIHANCE GADOBENATE DIMEGLUMINE 529 MG/ML IV SOLN COMPARISON:  12/22/2016 CT of  the head.  07/13/2012 MRI of the head. FINDINGS: Brain: No acute infarction, hemorrhage, hydrocephalus, extra-axial collection or mass lesion. A few nonspecific foci of T2 FLAIR hyperintense signal abnormality in subcortical and periventricular white matter is compatible with mild chronic microvascular ischemic changes for age. Mild brain parenchymal volume loss. Small stable chronic white matter infarct within the right posterior frontal periventricular white matter and small chronic lacunar infarcts in the right cerebellar hemisphere. No abnormal enhancement. Vascular: Normal flow voids. Skull and upper cervical spine: Normal marrow signal. Sinuses/Orbits: Negative. Other: None. IMPRESSION: 1. No acute intracranial abnormality or abnormal enhancement of the brain. 2. Mild for age chronic microvascular ischemic changes and mild parenchymal volume loss of the brain. Small chronic lacunar infarcts in right frontal periventricular white matter and right cerebellum. Electronically Signed   By: Kristine Garbe M.D.   On: 12/24/2016 01:35   Nm Pulmonary Perf And Vent  Result Date: 12/30/2016 CLINICAL DATA:  Shortness of breath. Patient with bilateral lower extremity deep venous thrombosis. EXAM: NUCLEAR MEDICINE VENTILATION - PERFUSION LUNG SCAN TECHNIQUE: Ventilation images were obtained in multiple projections using inhaled aerosol Tc-84m DTPA. Perfusion images were obtained in multiple projections after intravenous injection of Tc-64m MAA. RADIOPHARMACEUTICALS:  39.2 MCi Technetium-29m DTPA aerosol inhalation and 5.0 mCi Technetium-42m MAA IV COMPARISON:  PA and lateral chest earlier today and CT chest 09/13/2012. FINDINGS: Ventilation: Ventilation in right upper lobe is decreased relative to the right likely related to emphysematous disease. Ventilation is otherwise unremarkable. Perfusion: No wedge shaped peripheral perfusion defects to suggest acute pulmonary embolism. Perfusion to the right upper lobe  is slightly decreased relative to the left. IMPRESSION: Very low probability for pulmonary embolus. Electronically Signed   By: Inge Rise M.D.   On: 12/30/2016 14:32   US Venous Img Lower Bilateral  Result Date: 12/28/2016 CLINICAL DATA:  Leg swelling and pain for 5 days EXAM: BILATERAL LOWER EXTREMITY VENOUS DOPPLER ULTRASOUND TECHNIQUE: Gray-scale sonography with graded compression, as well as color Doppler and duplex ultrasound were performed to evaluate the lower extremity deep venous systems from the level of the common femoral vein and including the common femoral, femoral, profunda femoral, popliteal and calf veins including the posterior tibial, peroneal and gastrocnemius veins when visible. The superficial great saphenous vein was also interrogated. Spectral Doppler was utilized to evaluate flow at rest and with distal augmentation maneuvers in the common femoral, femoral and popliteal veins. COMPARISON:  07/27/2012 FINDINGS: RIGHT LOWER EXTREMITY Common Femoral Vein: Noncompressible with occlusive thrombus present. Saphenofemoral Junction: Noncompressible with occlusive thrombus present. Profunda Femoral Vein: Noncompressible with occlusive thrombus present. Femoral Vein: Noncompressible with occlusive thrombus present. Popliteal Vein: No evidence of thrombus. Normal compressibility, respiratory phasicity and response to augmentation. Calf Veins: No evidence of thrombus. Normal compressibility and flow on color Doppler imaging. LEFT LOWER EXTREMITY Common Femoral Vein: Noncompressible with occlusive thrombus present. Saphenofemoral Junction: Noncompressible with occlusive thrombus present. Profunda Femoral Vein: Noncompressible with occlusive thrombus present Femoral Vein: No evidence of thrombus. Normal compressibility, respiratory phasicity and response to augmentation. Popliteal Vein: Noncompressible with occlusive thrombus present. Calf Veins: No evidence of thrombus. Normal compressibility  and flow on color Doppler imaging. IMPRESSION: 1. Extensive occlusive thrombus within the right common femoral and femoral veins with extension of occlusive thrombus into the saphenous femoral  junction and image profunda vein. Deep veins are patent from the popliteal vein to the calf. 2. Positive for left lower extremity DVT with occlusive thrombus present in the left common femoral vein an visualized portions of the staff in all femoral junction and profunda vein. Occlusive thrombus also present within the left popliteal vein. Left femoral vein and calf veins appear patent. Critical Value/emergent results were called by telephone at the time of interpretation on 12/28/2016 at 4:35 pm to Dr. Rockey Situ who was covering for [facility] , who verbally acknowledged these results. Pt reportedly has IVC filter in place. Electronically Signed   By: Donavan Foil M.D.   On: 12/28/2016 16:36      Assessment/Plan 1.  Extensive Bilateral LE DVT with high risk for limb ischemia:  Patient is on a heparin drip. We will plan for thrombolysis using a mechanical device primarily to avoid large doses of TPA given his history of intracranial hemorrhage. He l then be maintained on anticoagulation. The risks and benefits of been reviewed including the risk of bleeding. All questions of been answered patient wishes to proceed with thrombosis.  * Shortness of breath  V/Q scan for pulmonary embolism is low probability.   * hypertension. Continue home medications.  * CKD stage III. Stable.  We will minimize contrast  Hortencia Pilar, MD  12/30/2016 4:20 PM    This note was created with Dragon medical transcription system.  Any error is purely unintentional

## 2016-12-30 NOTE — Plan of Care (Signed)
Problem: Pain Managment: Goal: General experience of comfort will improve Outcome: Not Progressing Patient unhappy about being kept NPO thus far pending successful completion of surgical procedure this afternoon. Will continue to support patient. Wenda Low [facility]

## 2016-12-30 NOTE — Progress Notes (Signed)
ANTICOAGULATION CONSULT NOTE - Initial Consult  Pharmacy Consult for Heparin  Indication: VTE   Allergies  Allergen Reactions  . Plavix [Clopidogrel Bisulfate]     Brain hemorrhage prev while on plavix and aspirin    Patient Measurements: Height: 5\' 9"  (175.3 cm) Weight: 176 lb (79.8 kg) IBW/kg (Calculated) : 70.7 Heparin Dosing Weight: 79.8  Vital Signs: Temp: 98.1 F (36.7 C) (09/07 0837) Temp Source: Oral (09/07 0837) BP: 126/54 (09/07 0837) Pulse Rate: 82 (09/07 0837)  Labs:  Recent Labs  12/29/16 1615 12/30/16 0103 12/30/16 1011  HGB 15.0 13.4  --   HCT 44.8 38.9*  --   PLT 196 167  --   APTT 38* 119* 107*  LABPROT 20.1*  --   --   INR 1.73  --   --   HEPARINUNFRC >3.60* >3.60*  --   CREATININE 1.36* 1.33*  --     Estimated Creatinine Clearance: 48 mL/min (A) (by C-G formula based on SCr of 1.33 mg/dL (H)).   Medical History: Past Medical History:  Diagnosis Date  . AAA (abdominal aortic aneurysm) ([place])   . Aortic calcification ([place]) 05/01/2013  . Arthritis    "hands; not dx'd" (12/26/2012)  . Bilateral renal artery stenosis ([place])    a. 06/2013 Angio/PTA: LRA: 95 (5x18 Herculink stent), RRA 60ost, celiac/SMA nl, RCIA patent stents extending into dist Ao, RIIA 70ost, REIA min irregs, LCIA patent stent, LIIA 60-70ost, LEIA min irregs.  . CHF (congestive heart failure) ([place])    Patient states he doesn't have CHF anymore per cardiologist.  . Coronary artery disease   . Hypertension   . Hypothyroidism   . Intraventricular hemorrhage ([place]) 07/12/2012  . Peripheral arterial disease ([place])    a. Previous left lower extremity stenting by Dr. Jamal Collin;  b. 12/2012 s/p bilat ostial common iliac stenting.  . Pneumonia   . Right leg DVT ([place]) 08/13/2012  . Subclavian artery stenosis, left ([place]) 05/2012   Status post stenting of the ostium and self-expanding stent placement to the left axillary artery  . Tobacco abuse    d/c 2012  . Venous insufficiency      Medications:  Prescriptions Prior to Admission  Medication Sig Dispense Refill Last Dose  . amLODipine (NORVASC) 5 MG tablet Take 5 mg by mouth daily.   12/29/2016 at am  . apixaban (ELIQUIS) 5 MG TABS tablet Take 10 mg by mouth 2 (two) times daily. After 7 days decrease to 5 mg twice a day   12/29/2016 at 1000  . atorvastatin (LIPITOR) 20 MG tablet Take 20 mg by mouth at bedtime.   12/28/2016 at pm  . carvedilol (COREG) 6.25 MG tablet Take 6.25 mg by mouth 2 (two) times daily with a meal.   12/29/2016 at 1000  . levothyroxine (SYNTHROID, LEVOTHROID) 150 MCG tablet Take 150 mcg by mouth daily before breakfast.   12/29/2016 at am   Scheduled:  . sodium chloride flush  3 mL Intravenous Q12H   Infusions:  . heparin 1,100 Units/hr (12/30/16 0434)    Assessment: Pharmacy consulted to dose and monitor Heparin gtt in this 76 year old male with VTE. Patient on apixaban as an outpatient.  Goal of Therapy:  APTT goal 66-102 s Heparin level 0.3-0.7 units/ml Monitor platelets by anticoagulation protocol: Yes   Plan:  Will decrease heparin infusion to 1050 units/hr and recheck aPTT 8 hours after rate change.   Ulice Dash, PharmD Clinical Pharmacist

## 2016-12-30 NOTE — Care Management (Signed)
CM screen due to diagnosis.  Patient with previous history of DVT with IVC filter as he was not able to  be placed on anticoagulants due to a previous history of intracranial hemorrhage.  He is admitted now with extensive bilateral dvts.  Was placed on Eliquis 12/28/2016 as an outpatient but when evaluated by physician in office 9/6 was sent to the ED for admission due to the extensive nature of the blood clots.  Vascular consult pending.  Patient is on heparin drip.  Lives with wife and no issues identified regarding accessing medical care or obtaining medications.  Pharmacy [facility]

## 2016-12-31 DIAGNOSIS — I82402 Acute embolism and thrombosis of unspecified deep veins of left lower extremity: Secondary | ICD-10-CM

## 2016-12-31 DIAGNOSIS — I82401 Acute embolism and thrombosis of unspecified deep veins of right lower extremity: Secondary | ICD-10-CM

## 2016-12-31 DIAGNOSIS — I82220 Acute embolism and thrombosis of inferior vena cava: Secondary | ICD-10-CM

## 2016-12-31 LAB — CBC
HCT: 36.1 % — ABNORMAL LOW (ref 40.0–52.0)
Hemoglobin: 12.4 g/dL — ABNORMAL LOW (ref 13.0–18.0)
MCH: 30.6 pg (ref 26.0–34.0)
MCHC: 34.2 g/dL (ref 32.0–36.0)
MCV: 89.2 fL (ref 80.0–100.0)
Platelets: 154 10*3/uL (ref 150–440)
RBC: 4.05 MIL/uL — ABNORMAL LOW (ref 4.40–5.90)
RDW: 14.5 % (ref 11.5–14.5)
WBC: 9.5 10*3/uL (ref 3.8–10.6)

## 2016-12-31 LAB — HEPARIN LEVEL (UNFRACTIONATED): HEPARIN UNFRACTIONATED: 1.43 [IU]/mL — AB (ref 0.30–0.70)

## 2016-12-31 LAB — APTT: APTT: 88 s — AB (ref 24–36)

## 2016-12-31 MED ORDER — APIXABAN 5 MG PO TABS
5.0000 mg | ORAL_TABLET | Freq: Two times a day (BID) | ORAL | Status: DC
  Administered 2016-12-31: 5 mg via ORAL
  Filled 2016-12-31: qty 1

## 2016-12-31 MED ORDER — TRAMADOL HCL 50 MG PO TABS: 50.0000 mg | ORAL_TABLET | Freq: Four times a day (QID) | ORAL | 0 refills | Status: DC | PRN

## 2016-12-31 NOTE — Discharge Summary (Signed)
[facility] NAME: [name]    MR#:  [id number]  DATE OF BIRTH:  [birth year]  DATE OF ADMISSION:  12/29/2016 ADMITTING PHYSICIAN: Hillary Bow, MD  DATE OF DISCHARGE: 12/31/2016  PRIMARY CARE PHYSICIAN: Owens Loffler, MD    ADMISSION DIAGNOSIS:  SOB (shortness of breath) [R06.02] Acute deep vein thrombosis (DVT) of femoral vein of both lower extremities (HCC) [I82.413] Edema, unspecified type [R60.9] DVT (deep venous thrombosis) ([place]) [I82.409]  DISCHARGE DIAGNOSIS:  Active Problems:   DVT (deep vein thrombosis) in pregnancy ([place])   DVT (deep venous thrombosis) ([place])   SECONDARY DIAGNOSIS:   Past Medical History:  Diagnosis Date  . AAA (abdominal aortic aneurysm) ([place])   . Aortic calcification ([place]) 05/01/2013  . Arthritis    "hands; not dx'd" (12/26/2012)  . Bilateral renal artery stenosis ([place])    a. 06/2013 Angio/PTA: LRA: 95 (5x18 Herculink stent), RRA 60ost, celiac/SMA nl, RCIA patent stents extending into dist Ao, RIIA 70ost, REIA min irregs, LCIA patent stent, LIIA 60-70ost, LEIA min irregs.  . CHF (congestive heart failure) ([place])    Patient states he doesn't have CHF anymore per cardiologist.  . Coronary artery disease   . Hypertension   . Hypothyroidism   . Intraventricular hemorrhage ([place]) 07/12/2012  . Peripheral arterial disease ([place])    a. Previous left lower extremity stenting by Dr. Jamal Collin;  b. 12/2012 s/p bilat ostial common iliac stenting.  . Pneumonia   . Right leg DVT ([place]) 08/13/2012  . Subclavian artery stenosis, left ([place]) 05/2012   Status post stenting of the ostium and self-expanding stent placement to the left axillary artery  . Tobacco abuse    d/c 2012  . Venous insufficiency     HOSPITAL COURSE:   76 yo male history of peripheral vascular disease with history of abdominal aortic aneurysm, hypertension, hypothyroidism, hyperlipidemia, history of CHF, history of coronary artery disease who  presents to the hospital due to significant lower extremity edema and noted to have extensive b/l DVT.   1. Bilateral extensive DVT-patient has a previous history of DVT and is s/p IVC filter 4 years ago. Now presented with new acute b/l DVT.  - patient was admitted to the hospital and started on a heparin nomogram. A vascular surgery consult was obtained. Patient underwent thrombectomy and thrombolysis and is postop day #1 today. -Patient lower ext. Pain and swelling has improved.  He is now being discharged on Eliquis.  He is going to be transitioned off Eliquis to Coumadin due to financial reasons but this is to be done as outpatient.   2. Shortness of breath - now much improved.  Initially thought to be due to PE given his extensive DVT but had V/Q scan which was low probability for PE.   - no hypoxic and some of his SOB is probably related to COPD given his extensive smoking hx.   3. Hx of CAD - he had no acute chest pain while in the hospital.  - he will cont. Coreg, Norvasc.   4. Hyperlipidemia - he will cont. Atorvastatin  5. Hypothyroidism - he will cont. Synthroid.   DISCHARGE CONDITIONS:   Stable.   CONSULTS OBTAINED:  Treatment Team:  Katha Cabal, MD  DRUG ALLERGIES:   Allergies  Allergen Reactions  . Plavix [Clopidogrel Bisulfate]     Brain hemorrhage prev while on plavix and aspirin    DISCHARGE MEDICATIONS:   Allergies as of 12/31/2016  Reactions   Plavix [clopidogrel Bisulfate]    Brain hemorrhage prev while on plavix and aspirin      Medication List    TAKE these medications   amLODipine 5 MG tablet Commonly known as:  NORVASC Take 5 mg by mouth daily.   atorvastatin 20 MG tablet Commonly known as:  LIPITOR Take 20 mg by mouth at bedtime.   carvedilol 6.25 MG tablet Commonly known as:  COREG Take 6.25 mg by mouth 2 (two) times daily with a meal.   ELIQUIS 5 MG Tabs tablet Generic drug:  apixaban Take 10 mg by mouth 2 (two) times  daily. After 7 days decrease to 5 mg twice a day   levothyroxine 150 MCG tablet Commonly known as:  SYNTHROID, LEVOTHROID Take 150 mcg by mouth daily before breakfast.   traMADol 50 MG tablet Commonly known as:  ULTRAM Take 1 tablet (50 mg total) by mouth every 6 (six) hours as needed for moderate pain.            Discharge Care Instructions        Start     Ordered   12/31/16 0000  traMADol (ULTRAM) 50 MG tablet  Every 6 hours PRN     12/31/16 1250   12/31/16 0000  Activity as tolerated - No restrictions     12/31/16 1250   12/31/16 0000  Diet - low sodium heart healthy     12/31/16 1250        DISCHARGE INSTRUCTIONS:   DIET:  Cardiac diet  DISCHARGE CONDITION:  Stable  ACTIVITY:  Activity as tolerated  OXYGEN:  Home Oxygen: No.   Oxygen Delivery: room air  DISCHARGE LOCATION:  home   If you experience worsening of your admission symptoms, develop shortness of breath, life threatening emergency, suicidal or homicidal thoughts you must seek medical attention immediately by calling 911 or calling your MD immediately  if symptoms less severe.  You Must read complete instructions/literature along with all the possible adverse reactions/side effects for all the Medicines you take and that have been prescribed to you. Take any new Medicines after you have completely understood and accpet all the possible adverse reactions/side effects.   Please note  You were cared for by a hospitalist during your hospital stay. If you have any questions about your discharge medications or the care you received while you were in the hospital after you are discharged, you can call the unit and asked to speak with the hospitalist on call if the hospitalist that took care of you is not available. Once you are discharged, your primary care physician will handle any further medical issues. Please note that NO REFILLS for any discharge medications will be authorized once you are  discharged, as it is imperative that you return to your primary care physician (or establish a relationship with a primary care physician if you do not have one) for your aftercare needs so that they can reassess your need for medications and monitor your lab values.     Today   LE edema and pain has improved. Pt. Denies any CP or worsening SOB.  Wife at bedside.    VITAL SIGNS:  Blood pressure (!) 102/48, pulse 76, temperature 99 F (37.2 C), temperature source Oral, resp. rate 18, height 5\' 9"  (1.753 m), weight 78.9 kg (174 lb), SpO2 96 %.  I/O:   Intake/Output Summary (Last 24 hours) at 12/31/16 1419 Last data filed at 12/31/16 1401  Gross per 24 hour  Intake           603.75 ml  Output              750 ml  Net          -146.25 ml    PHYSICAL EXAMINATION:   GENERAL:  76 y.o.-year-old patient lying in bed in mild Resp. distress.  EYES: Pupils equal, round, reactive to light and accommodation. No scleral icterus. Extraocular muscles intact.  HEENT: Head atraumatic, normocephalic. Oropharynx and nasopharynx clear.  NECK:  Supple, no jugular venous distention. No thyroid enlargement, no tenderness.  LUNGS: Normal breath sounds bilaterally, no wheezing, rales, rhonchi. No use of accessory muscles of respiration.  CARDIOVASCULAR: S1, S2 normal. No murmurs, rubs, or gallops.  ABDOMEN: Soft, nontender, nondistended. Bowel sounds present. No organomegaly or mass.  EXTREMITIES: No cyanosis, clubbing, +1-2 edema b/l.     NEUROLOGIC: Cranial nerves II through XII are intact. No focal Motor or sensory deficits b/l.   PSYCHIATRIC: The patient is alert and oriented x 3.  SKIN: No obvious rash, lesion, or ulcer.   DATA REVIEW:   CBC  Recent Labs Lab 12/31/16 0524  WBC 9.5  HGB 12.4*  HCT 36.1*  PLT 154    Chemistries   Recent Labs Lab 12/29/16 1615 12/30/16 0103  NA 136 136  K 4.8 4.3  CL 101 105  CO2 24 25  GLUCOSE 118* 109*  BUN 26* 25*  CREATININE 1.36* 1.33*   CALCIUM 9.5 8.6*  AST 20  --   ALT 12*  --   ALKPHOS 82  --   BILITOT 1.5*  --     Cardiac Enzymes  Recent Labs Lab 12/24/16 1817  TROPONINI <0.03     RADIOLOGY:  Dg Chest 2 View  Result Date: 12/29/2016 CLINICAL DATA:  History of DVT. Bilateral leg pain and shortness of breath. EXAM: CHEST  2 VIEW COMPARISON:  December 22, 2016 FINDINGS: No pneumothorax. Vascular stents again seen on the left. A new nodular density over the lateral left lung base is favored to represent a nipple shadow. Blunting of the left costophrenic angle may represent a small effusion versus pleural thickening. The heart, hila, and mediastinum are unchanged. No other changes within the lungs. IMPRESSION: 1. Nodular density over the lateral left lung base is favored to represent a nipple shadow. Recommend repeat imaging with nipple markers before discharge. 2. Pleural thickening versus small effusion on the left. 3. No other acute abnormalities. Electronically Signed   By: Dorise Bullion III M.D   On: 12/29/2016 18:41   Nm Pulmonary Perf And Vent  Result Date: 12/30/2016 CLINICAL DATA:  Shortness of breath. Patient with bilateral lower extremity deep venous thrombosis. EXAM: NUCLEAR MEDICINE VENTILATION - PERFUSION LUNG SCAN TECHNIQUE: Ventilation images were obtained in multiple projections using inhaled aerosol Tc-68m DTPA. Perfusion images were obtained in multiple projections after intravenous injection of Tc-14m MAA. RADIOPHARMACEUTICALS:  39.2 MCi Technetium-74m DTPA aerosol inhalation and 5.0 mCi Technetium-63m MAA IV COMPARISON:  PA and lateral chest earlier today and CT chest 09/13/2012. FINDINGS: Ventilation: Ventilation in right upper lobe is decreased relative to the right likely related to emphysematous disease. Ventilation is otherwise unremarkable. Perfusion: No wedge shaped peripheral perfusion defects to suggest acute pulmonary embolism. Perfusion to the right upper lobe is slightly decreased relative to  the left. IMPRESSION: Very low probability for pulmonary embolus. Electronically Signed   By: Inge Rise M.D.   On: 12/30/2016 14:32      Management plans  discussed with the patient, family and they are in agreement.  CODE STATUS:     Code Status Orders        Start     Ordered   12/29/16 1742  Full code  Continuous     12/29/16 1742    TOTAL TIME TAKING CARE OF THIS PATIENT: 40 minutes.    Henreitta Leber M.D on 12/31/2016 at 2:19 PM  Between 7am to 6pm - Pager - [phone]  After 6pm go to [email or web address] - Proofreader  [facility]  Office  [phone]  CC: Primary care physician; Owens Loffler, MD

## 2016-12-31 NOTE — Plan of Care (Signed)
Problem: Tissue Perfusion: Goal: Risk factors for ineffective tissue perfusion will decrease Outcome: Progressing Continue peripheral pulse/neurovascular checks via doppler q 4 hrs  Problem: Nutrition: Goal: Adequate nutrition will be maintained Outcome: Progressing Pt consumed 100% of dinner following vascular surgery

## 2016-12-31 NOTE — Progress Notes (Signed)
ANTICOAGULATION CONSULT NOTE - Initial Consult  Pharmacy Consult for Heparin  Indication: VTE   Allergies  Allergen Reactions  . Plavix [Clopidogrel Bisulfate]     Brain hemorrhage prev while on plavix and aspirin    Patient Measurements: Height: 5\' 9"  (175.3 cm) Weight: 174 lb (78.9 kg) IBW/kg (Calculated) : 70.7 Heparin Dosing Weight: 79.8  Vital Signs: Temp: 98.7 F (37.1 C) (09/08 0436) Temp Source: Oral (09/08 0436) BP: 125/57 (09/08 0436) Pulse Rate: 79 (09/08 0436)  Labs:  Recent Labs  12/29/16 1615 12/30/16 0103 12/30/16 1011 12/31/16 0524  HGB 15.0 13.4  --  12.4*  HCT 44.8 38.9*  --  36.1*  PLT 196 167  --  154  APTT 38* 119* 107* 88*  LABPROT 20.1*  --   --   --   INR 1.73  --   --   --   HEPARINUNFRC >3.60* >3.60*  --  1.43*  CREATININE 1.36* 1.33*  --   --     Estimated Creatinine Clearance: 48 mL/min (A) (by C-G formula based on SCr of 1.33 mg/dL (H)).   Medical History: Past Medical History:  Diagnosis Date  . AAA (abdominal aortic aneurysm) ([place])   . Aortic calcification ([employer]) 05/01/2013  . Arthritis    "hands; not dx'd" (12/26/2012)  . Bilateral renal artery stenosis ([place])    a. 06/2013 Angio/PTA: LRA: 95 (5x18 Herculink stent), RRA 60ost, celiac/SMA nl, RCIA patent stents extending into dist Ao, RIIA 70ost, REIA min irregs, LCIA patent stent, LIIA 60-70ost, LEIA min irregs.  . CHF (congestive heart failure) ([place])    Patient states he doesn't have CHF anymore per cardiologist.  . Coronary artery disease   . Hypertension   . Hypothyroidism   . Intraventricular hemorrhage ([place]) 07/12/2012  . Peripheral arterial disease ([place])    a. Previous left lower extremity stenting by Dr. Jamal Collin;  b. 12/2012 s/p bilat ostial common iliac stenting.  . Pneumonia   . Right leg DVT ([place]) 08/13/2012  . Subclavian artery stenosis, left ([place]) 05/2012   Status post stenting of the ostium and self-expanding stent placement to the left axillary artery  . Tobacco  abuse    d/c 2012  . Venous insufficiency     Medications:  Prescriptions Prior to Admission  Medication Sig Dispense Refill Last Dose  . amLODipine (NORVASC) 5 MG tablet Take 5 mg by mouth daily.   12/29/2016 at am  . apixaban (ELIQUIS) 5 MG TABS tablet Take 10 mg by mouth 2 (two) times daily. After 7 days decrease to 5 mg twice a day   12/29/2016 at 1000  . atorvastatin (LIPITOR) 20 MG tablet Take 20 mg by mouth at bedtime.   12/28/2016 at pm  . carvedilol (COREG) 6.25 MG tablet Take 6.25 mg by mouth 2 (two) times daily with a meal.   12/29/2016 at 1000  . levothyroxine (SYNTHROID, LEVOTHROID) 150 MCG tablet Take 150 mcg by mouth daily before breakfast.   12/29/2016 at am   Scheduled:  . amLODipine  5 mg Oral Daily  . amLODipine  5 mg Oral Daily  . atorvastatin  20 mg Oral QHS  . carvedilol  6.25 mg Oral BID WC  . carvedilol  6.25 mg Oral BID WC  . ipratropium-albuterol      . labetalol      . levothyroxine  150 mcg Oral QAC breakfast  . sodium chloride flush  3 mL Intravenous Q12H   Infusions:  . heparin 1,050 Units/hr (12/31/16 0500)  Assessment: Pharmacy consulted to dose and monitor Heparin gtt in this 76 year old male with VTE. Patient on apixaban as an outpatient.  Goal of Therapy:  APTT goal 66-102 s Heparin level 0.3-0.7 units/ml Monitor platelets by anticoagulation protocol: Yes   Plan:  Will decrease heparin infusion to 1050 units/hr and recheck aPTT 8 hours after rate change.   09/08 AM aPTT 88, heparin level 1.43. Continue current regimen. Recheck aPTT, heparin level and CBC with tomorrow AM labs.  Sim Boast, PharmD, BCPS  12/31/16 6:31 AM

## 2016-12-31 NOTE — Progress Notes (Signed)
Patient discharge instructions reviewed with patient and wife. Pt/wife verbalized understanding. IV removed, pressure dressing applied. Legs wrapped with ace bandage prior to discharge. Apixban discount card given with discharged instructions. No acute distress noted upon discharge.

## 2016-12-31 NOTE — Progress Notes (Signed)
Patient returned from specials accompanied by the family and an Therapist, sports. Alert and oriented x 4. Denied any acute pain. No sign/symptoms of respiratory distress noted. Will continue to monitor.

## 2016-12-31 NOTE — Care Management Note (Signed)
Case Management Note  Patient Details  Name: [name] MRN: [id number] Date of Birth: [birth year]  Subjective/Objective:          [name] was provided with an Eliquis discount coupon.           Action/Plan:   Expected Discharge Date:  12/31/16               Expected Discharge Plan:     In-House Referral:  NA  Discharge planning Services  NA  Post Acute Care Choice:  NA Choice offered to:  NA  DME Arranged:  N/A DME Agency:  NA  HH Arranged:  NA HH Agency:  NA  Status of Service:  Completed, signed off  If discussed at [place] of Stay Meetings, dates discussed:    Additional Comments:  Galdino Hinchman A, RN 12/31/2016, 2:20 PM

## 2016-12-31 NOTE — Progress Notes (Signed)
[facility]  Daily Progress Note   Subjective  - 1 Day Post-Op  The patient states his legs feels much better and his lower back and abdominal discomfort is improved  Objective Vitals:   12/30/16 2341 12/31/16 0436 12/31/16 0936 12/31/16 1230  BP: (!) 129/51 (!) 125/57 (!) 124/51 (!) 102/48  Pulse: 88 79 81 76  Resp:    18  Temp: 98.9 F (37.2 C) 98.7 F (37.1 C) 97.9 F (36.6 C) 99 F (37.2 C)  TempSrc: Oral Oral Oral Oral  SpO2: 92% 91% 94% 96%  Weight:  78.9 kg (174 lb)    Height:        Intake/Output Summary (Last 24 hours) at 12/31/16 1512 Last data filed at 12/31/16 1401  Gross per 24 hour  Intake           603.75 ml  Output              750 ml  Net          -146.25 ml    PULM  Normal effort , no use of accessory muscles CV  No JVD, RRR Abd      No distended, nontender VASC  Lower extremities with only trace edema today  Laboratory CBC    Component Value Date/Time   WBC 9.5 12/31/2016 0524   HGB 12.4 (L) 12/31/2016 0524   HGB 12.8 (L) 06/03/2013 0454   HCT 36.1 (L) 12/31/2016 0524   HCT 37.3 (L) 06/03/2013 0454   PLT 154 12/31/2016 0524   PLT 323 06/03/2013 0454    BMET    Component Value Date/Time   NA 136 12/30/2016 0103   NA 140 07/28/2015 1006   NA 136 06/03/2013 0454   K 4.3 12/30/2016 0103   K 4.1 06/03/2013 0454   CL 105 12/30/2016 0103   CL 106 06/03/2013 0454   CO2 25 12/30/2016 0103   CO2 27 06/03/2013 0454   GLUCOSE 109 (H) 12/30/2016 0103   GLUCOSE 80 06/03/2013 0454   BUN 25 (H) 12/30/2016 0103   BUN 18 07/28/2015 1006   BUN 22 (H) 06/03/2013 0454   CREATININE 1.33 (H) 12/30/2016 0103   CREATININE 1.59 (H) 06/03/2013 0454   CREATININE 1.02 04/22/2011 1507   CALCIUM 8.6 (L) 12/30/2016 0103   CALCIUM 8.8 06/03/2013 0454   GFRNONAA 51 (L) 12/30/2016 0103   GFRNONAA 43 (L) 06/03/2013 0454   GFRAA 59 (L) 12/30/2016 0103   GFRAA 50 (L) 06/03/2013 0454    Assessment/Planning: POD #1 s/p bilateral  venous thrombectomy   Patient is doing well with successful thrombectomy. He is markedly improved. Lengthy discussion with the patient regarding the importance of both elevation and of compression therapy on a daily basis. Patient states he has thigh-high comp Also lengthy discussion regarding anticoagulation. He does prescription coverage and therefore cannot afford his Juliene Pina was areas however he does have sufficient samples so that he can be discharged to home and then bridged to Coumadin as an outpatient. He will follow-up with me in 2 weeks.         A total of 40 minutes was spent with this patient and greater than 50% was spent in counseling and coordination of care with the patient.  Discussion included the treatment options for vascular disease including indications for surgery and intervention.  Also discussed is the appropriate timing of treatment.  In addition medical therapy was discussed.    Hortencia Pilar  12/31/2016, 3:12 PM

## 2017-01-02 ENCOUNTER — Telehealth: Payer: Self-pay

## 2017-01-02 ENCOUNTER — Encounter: Payer: Self-pay | Admitting: Vascular Surgery

## 2017-01-02 NOTE — Telephone Encounter (Signed)
Transition Care Management Follow-up Telephone Call   Date discharged? 12/31/16   How have you been since you were released from the hospital? "Slowly getting better"   Do you understand why you were in the hospital? yes   Do you understand the discharge instructions? yes   Where were you discharged to? Home. Lives with wife.    Items Reviewed:  Medications reviewed: no  Allergies reviewed: yes  Dietary changes reviewed: no  Referrals reviewed: no   Functional Questionnaire:   Activities of Daily Living (ADLs):   He states they are independent in the following: ambulation, bathing and hygiene, feeding, continence, grooming, toileting and dressing States they require assistance with the following: None.    Any transportation issues/concerns?: no   Any patient concerns? No, pt states his legs feel pretty good but still sore in the groin area. Still an improvement however.    Confirmed importance and date/time of follow-up visits scheduled yes  Provider Appointment booked with PCP 01/09/17 @ 1130  Confirmed with patient if condition begins to worsen call PCP or go to the ER.  Patient was given the office number and encouraged to call back with question or concerns.  : yes

## 2017-01-09 ENCOUNTER — Ambulatory Visit: Payer: Medicare Other | Admitting: Family Medicine

## 2017-01-16 ENCOUNTER — Ambulatory Visit (INDEPENDENT_AMBULATORY_CARE_PROVIDER_SITE_OTHER): Payer: Medicare Other | Admitting: Family Medicine

## 2017-01-16 ENCOUNTER — Encounter: Payer: Self-pay | Admitting: Family Medicine

## 2017-01-16 VITALS — BP 90/56 | HR 81 | Temp 97.9°F | Ht 68.5 in | Wt 176.5 lb

## 2017-01-16 DIAGNOSIS — I779 Disorder of arteries and arterioles, unspecified: Secondary | ICD-10-CM | POA: Diagnosis not present

## 2017-01-16 DIAGNOSIS — I10 Essential (primary) hypertension: Secondary | ICD-10-CM

## 2017-01-16 DIAGNOSIS — I615 Nontraumatic intracerebral hemorrhage, intraventricular: Secondary | ICD-10-CM

## 2017-01-16 DIAGNOSIS — I82403 Acute embolism and thrombosis of unspecified deep veins of lower extremity, bilateral: Secondary | ICD-10-CM

## 2017-01-16 DIAGNOSIS — J449 Chronic obstructive pulmonary disease, unspecified: Secondary | ICD-10-CM

## 2017-01-16 DIAGNOSIS — I739 Peripheral vascular disease, unspecified: Secondary | ICD-10-CM

## 2017-01-16 NOTE — Patient Instructions (Signed)
Cut the amlodipine to 1/2 tablet once a day.

## 2017-01-16 NOTE — Progress Notes (Signed)
Dr. Frederico Hamman T. Fate Galanti, MD, [facility] [place], [address] Phone: [phone] Fax: [phone]  01/16/2017  Patient: [name], MRN: [id number], DOB: 1940/08/15, 76 y.o.  Primary Physician:  Owens Loffler, MD   Chief Complaint  Patient presents with  . Hospitalization Follow-up    DVT   Subjective:   [name] is a 76 y.o. very pleasant male patient who presents with the following:  Hospital follow-up. Essentially the patient was admitted with bilateral massive DVTs, with a history of DVT and IVC filter placement 4 years ago.  He is diagnosed with DVT by Dr. Fletcher Anon and admitted to [facility], where he was placed on a heparin drip.  Vascular surgery was consulted.  Dr. Delana Meyer did a thrombectomy and thrombolysis.  He continues to wear thigh-high compression stockings.  He does feel fatigued overall, and continues to have some shortness of breath with known COPD.  VQ scan was negative, or low probability for PE.  He does not have medical insurance for prescriptions, and he and his wife request possible transition to Coumadin.   He also has had some low BP's.  DATE OF ADMISSION:  12/29/2016       DATE OF DISCHARGE: 12/31/2016  Out of it and lost. Episode a thurs morning and felt a cramp on the R leg. Sat down and felt dizzy - then started to ? Seize.  Went back home and same thing, went at 1 AM. Still dehydration. D/c on Sunday.  Dr. Audelia Acton ordered DVT dopplers.   Heparin drip and thrombectomy.   Still having some low BP's.  Past Medical History, Surgical History, Social History, Family History, Problem List, Medications, and Allergies have been reviewed and updated if relevant.  Patient Active Problem List   Diagnosis Date Noted  . CKD (chronic kidney disease) stage 3, GFR 30-59 ml/min 05/01/2013    Priority: High  . Intraventricular hemorrhage ([place]) 07/12/2012    Priority: High  . Ischemic cardiomyopathy  07/08/2011    Priority: High  . CAD (coronary artery disease), native coronary artery 07/07/2011    Priority: High  . COPD (chronic obstructive pulmonary disease) ([place]) 01/08/2010    Priority: High  . Bilateral renal artery stenosis (HCC)     Priority: Medium  . HTN (hypertension) 07/15/2012    Priority: Medium  . Peripheral arterial disease ([place])     Priority: Medium  . Hypothyroidism 09/07/2006    Priority: Medium  . DVT (deep venous thrombosis) ([place]) 12/29/2016  . RAS (renal artery stenosis) ([place]) 07/10/2013  . Paroxysmal a-fib: briefly in ED now in NSR 05/24/2013  . Aortic calcification ([place]) 05/01/2013  . Subclavian arterial stenosis ([place]) 04/25/2011  . BPH (benign prostatic hyperplasia) 12/29/2010  . Abdominal aortic aneurysm ([place]) 11/06/2009  . HYPERCHOLESTEROLEMIA 10/09/2009  . TOBACCO ABUSE 04/11/2008  . PROSTATE SPECIFIC ANTIGEN, ELEVATED 04/11/2008    Past Medical History:  Diagnosis Date  . AAA (abdominal aortic aneurysm) ([place])   . Aortic calcification ([place]) 05/01/2013  . Arthritis    "hands; not dx'd" (12/26/2012)  . Bilateral renal artery stenosis ([place])    a. 06/2013 Angio/PTA: LRA: 95 (5x18 Herculink stent), RRA 60ost, celiac/SMA nl, RCIA patent stents extending into dist Ao, RIIA 70ost, REIA min irregs, LCIA patent stent, LIIA 60-70ost, LEIA min irregs.  . CHF (congestive heart failure) ([place])    Patient states he doesn't have CHF anymore per cardiologist.  . Coronary artery disease   .  Hypertension   . Hypothyroidism   . Intraventricular hemorrhage ([place]) 07/12/2012  . Peripheral arterial disease ([place])    a. Previous left lower extremity stenting by Dr. Jamal Collin;  b. 12/2012 s/p bilat ostial common iliac stenting.  . Pneumonia   . Right leg DVT ([place]) 08/13/2012  . Subclavian artery stenosis, left ([place]) 05/2012   Status post stenting of the ostium and self-expanding stent placement to the left axillary artery  . Tobacco abuse    d/c 2012  . Venous insufficiency      Past Surgical History:  Procedure Laterality Date  . ABDOMINAL ANGIOGRAM  07/10/2013   WITH BI-FEMORAL RUNOFF       DR ARIDA  . ABDOMINAL AORTAGRAM N/A 12/26/2012   Procedure: ABDOMINAL Maxcine Ham;  Surgeon: Wellington Hampshire, MD;  Location: [facility];  Service: Cardiovascular;  Laterality: N/A;  . ABDOMINAL AORTAGRAM N/A 07/10/2013   Procedure: ABDOMINAL Maxcine Ham;  Surgeon: Wellington Hampshire, MD;  Location: [facility];  Service: Cardiovascular;  Laterality: N/A;  . ANGIOPLASTY / STENTING FEMORAL  05/2012  . ANGIOPLASTY / STENTING ILIAC Bilateral 12/26/2012  . ARCH AORTOGRAM N/A 06/20/2012   Procedure: ARCH AORTOGRAM;  Surgeon: Wellington Hampshire, MD;  Location: [facility];  Service: Cardiovascular;  Laterality: N/A;  . CARDIAC CATHETERIZATION  2/14   [facility]  . CHOLECYSTECTOMY  1990's  . CORONARY ARTERY BYPASS GRAFT  07/11/2011   Procedure: CORONARY ARTERY BYPASS GRAFTING (CABG);  Surgeon: Grace Isaac, MD;  Location: [facility];  Service: Open Heart Surgery;  Laterality: N/A;  Times 3. On Pump. Using right greater saphenous vein and left internal mammary artery.   Marland Kitchen HERNIA REPAIR    . INSERTION OF ILIAC STENT Bilateral 12/26/2012   Procedure: INSERTION OF ILIAC STENT;  Surgeon: Wellington Hampshire, MD;  Location: [facility];  Service: Cardiovascular;  Laterality: Bilateral;  Bilateral Common Iliac Artery  . LEFT AND RIGHT HEART CATHETERIZATION WITH CORONARY ANGIOGRAM  07/07/2011   Procedure: LEFT AND RIGHT HEART CATHETERIZATION WITH CORONARY ANGIOGRAM;  Surgeon: Minna Merritts, MD;  Location: [facility];  Service: Cardiovascular;;  . LOWER EXTREMITY VENOGRAPHY Bilateral 12/30/2016   Procedure: Lower Extremity Venography;  Surgeon: Katha Cabal, MD;  Location: [facility] LAB;  Service: Cardiovascular;  Laterality: Bilateral;  . PERCUTANEOUS STENT INTERVENTION  06/20/2012   Procedure: PERCUTANEOUS STENT INTERVENTION;  Surgeon: Wellington Hampshire, MD;  Location: [facility];  Service:  Cardiovascular;;  . RENAL ARTERY ANGIOPLASTY Left 07/10/2013   DR ARIDA  . SUBCLAVIAN ARTERY STENT  05/2012   "2 stents" (12/26/2012)  . TOOTH EXTRACTION  Spring 2017   mulitple (6)  . VENA CAVA FILTER PLACEMENT  07/2012   Removable    Social History   Social History  . Marital status: Married    Spouse name: N/A  . Number of children: 2  . Years of education: N/A   Occupational History  . Retired Retired    Land   Social History Main Topics  . Smoking status: Former Smoker    Packs/day: 1.00    Years: 50.00    Types: Cigarettes    Quit date: 12/25/2010  . Smokeless tobacco: Never Used  . Alcohol use No  . Drug use: No  . Sexual activity: Not Currently   Other Topics Concern  . Not on file   Social History Narrative   Exercsiing 3 times a week.    Moderate diet control.    O living  will, no HCPOA.    Family History  Problem Relation Age of Onset  . Alcohol abuse Father   . Cirrhosis Father   . Hypothyroidism Sister     Allergies  Allergen Reactions  . Plavix [Clopidogrel Bisulfate]     Brain hemorrhage prev while on plavix and aspirin    Medication list reviewed and updated in full in [place].   GEN: No acute illnesses, no fevers, chills. GI: No n/v/d, eating normally Pulm: No SOB Interactive and getting along well at home.  Otherwise, ROS is as per the HPI.  Objective:   BP (!) 90/56   Pulse 81   Temp 97.9 F (36.6 C) (Oral)   Ht 5' 8.5" (1.74 m)   Wt 176 lb 8 oz (80.1 kg)   BMI 26.45 kg/m   GEN: WDWN, NAD, Non-toxic, A & O x 3 HEENT: Atraumatic, Normocephalic. Neck supple. No masses, No LAD. Ears and Nose: No external deformity. CV: RRR, 2/6 SEM. No JVD. No thrill. No extra heart sounds. PULM: CTA B, no wheezes, crackles, rhonchi. No retractions. No resp. distress. No accessory muscle use. EXTR: No c/c/tr edema - wearing compression NEURO Normal gait.  PSYCH: Normally interactive. Conversant. Not depressed or  anxious appearing.  Calm demeanor.   Laboratory and Imaging Data:  Assessment and Plan:   Acute deep vein thrombosis (DVT) of both lower extremities, unspecified vein (HCC)  Chronic obstructive pulmonary disease, unspecified COPD type ([place])  Intraventricular hemorrhage (HCC)  Peripheral arterial disease (HCC)  Essential hypertension   Decrease Norvasc to half a tablet a day.  Primary question here is of anti-coagulation.  It is not clear from the hospital notes who is to manage this, but the patient asked me to manage this, which I think is reasonable.  I'm going to touch base with Dr. Fletcher Anon to make sure we agree on POC.  He is currently on Eliquis, which is not financially sustainable for him, so he asks to transition to oral warfarin.   Future Appointments Date Time Provider [place]  01/19/2017 3:45 PM Schnier, Dolores Lory, MD [facility] None  09/19/2017 9:00 AM Eustace Pen, LPN [facility]  10/02/2017 10:30 AM Blaike Vickers, Frederico Hamman, MD [facility]   Signed,  Maud Deed. Zakia Sainato, MD   Patient's Medications  New Prescriptions   No medications on file  Previous Medications   AMLODIPINE (NORVASC) 5 MG TABLET    Take 5 mg by mouth daily.   APIXABAN (ELIQUIS) 5 MG TABS TABLET    Take 10 mg by mouth 2 (two) times daily. After 7 days decrease to 5 mg twice a day   ATORVASTATIN (LIPITOR) 20 MG TABLET    Take 20 mg by mouth at bedtime.   CARVEDILOL (COREG) 6.25 MG TABLET    Take 6.25 mg by mouth 2 (two) times daily with a meal.   LEVOTHYROXINE (SYNTHROID, LEVOTHROID) 150 MCG TABLET    Take 150 mcg by mouth daily before breakfast.   TRAMADOL (ULTRAM) 50 MG TABLET    Take 1 tablet (50 mg total) by mouth every 6 (six) hours as needed for moderate pain.  Modified Medications   No medications on file  Discontinued Medications   No medications on file

## 2017-01-18 ENCOUNTER — Telehealth: Payer: Self-pay

## 2017-01-18 MED ORDER — WARFARIN SODIUM 5 MG PO TABS: 5.0000 mg | ORAL_TABLET | Freq: Every day | ORAL | 2 refills | Status: DC

## 2017-01-18 NOTE — Telephone Encounter (Signed)
Reviewed and received okay from Dr. [name] to move forward with dosing plan below.  Patient and wife aware.  They have my number to call if any questions before f/u visit with me next week.

## 2017-01-18 NOTE — Telephone Encounter (Signed)
Agree with plan of care

## 2017-01-18 NOTE — Telephone Encounter (Signed)
-----   Message from Owens Loffler, MD sent at 01/18/2017  8:29 AM EDT ----- Regarding: FW: anticoag question Can you help get him started up with Coumadin. I am here to discuss if you want.   Frederico Hamman  ----- Message ----- From: Wellington Hampshire, MD Sent: 01/18/2017   8:25 AM To: Owens Loffler, MD Subject: RE: anticoag question                          I agree with 3 day overlap. His DVT was impressive and probably should stay on lifelong anticoagulation. His prior ICH was minor and in the setting of severely elevated BP while he was on both anticoagulation and plavix.   Thanks.     ----- Message ----- From: Owens Loffler, MD Sent: 01/17/2017   1:58 PM To: Wellington Hampshire, MD Subject: anticoag question                              [place],   I wanted to speak to you about this patient - he had the huge B DVT's that Dr. Ronalee Belts operated on.   He does not have Rx insurance and can't afford Eliquis. I am not sure I have ever had a similar case with prior ICH, IVF, prior clots. I think we should be fine with 3 days overlap for Eliquis and Coumadin transition with close follow-up. I wanted to be sure we all agreed.   Thanks,  [place]

## 2017-01-18 NOTE — Telephone Encounter (Signed)
Discussed with patient transitioning over from Eliquis to warfarin.  Plan for patient to start Warfarin 5mg  daily on 01/23/17.  I will send in R/X today to [facility]. He will continue on bid Eliquis (10/1, 10/2 and 10/3).  10/3 pm will be last dose. Patient to continue daily 5mg  of warfarin until recheck here in the [facility] on 01/27/17.  Dr. [name],  Please advise if okay to move forward with above transition plan.  Thanks.

## 2017-01-19 ENCOUNTER — Ambulatory Visit (INDEPENDENT_AMBULATORY_CARE_PROVIDER_SITE_OTHER): Payer: Medicare Other | Admitting: Vascular Surgery

## 2017-01-19 ENCOUNTER — Encounter (INDEPENDENT_AMBULATORY_CARE_PROVIDER_SITE_OTHER): Payer: Self-pay | Admitting: Vascular Surgery

## 2017-01-19 VITALS — BP 149/70 | HR 74 | Resp 16 | Ht 69.0 in | Wt 175.0 lb

## 2017-01-19 DIAGNOSIS — I82403 Acute embolism and thrombosis of unspecified deep veins of lower extremity, bilateral: Secondary | ICD-10-CM | POA: Diagnosis not present

## 2017-01-19 DIAGNOSIS — I714 Abdominal aortic aneurysm, without rupture, unspecified: Secondary | ICD-10-CM

## 2017-01-19 DIAGNOSIS — I779 Disorder of arteries and arterioles, unspecified: Secondary | ICD-10-CM

## 2017-01-19 DIAGNOSIS — I2510 Atherosclerotic heart disease of native coronary artery without angina pectoris: Secondary | ICD-10-CM | POA: Diagnosis not present

## 2017-01-20 ENCOUNTER — Telehealth: Payer: Self-pay

## 2017-01-20 NOTE — Telephone Encounter (Signed)
Patient's wife calls to clarify if okay for patient to take the pm Eliquis and coumadin medications together during his 3 day overlap next week.    This nurse returns call at 5:40pm and verifies okay to take together in the pm.  No reason for concern.    Patient wife verbalizes understanding.

## 2017-01-21 NOTE — Progress Notes (Signed)
MRN : [id number]  [name] is a 76 y.o. ([birth year]) male who presents with chief complaint of  Chief Complaint  Patient presents with  . Follow-up    2-3 week f/u  .  History of Present Illness:  The patient presents to the office for evaluation of DVT.  DVT was identified at [facility] by Duplex ultrasound.  The initial symptoms were pain and swelling in the lower extremity.  The patient notes the leg continues to be very painful with dependency and swells quite a bite.  Symptoms are much better with elevation.  The patient notes minimal edema in the morning which steadily worsens throughout the day.    The patient has not been using compression therapy at this point.  No SOB or pleuritic chest pains.  No cough or hemoptysis.  No blood per rectum or blood in any sputum.  No excessive bruising per the patient.       Current Meds  Medication Sig  . amLODipine (NORVASC) 5 MG tablet Take 5 mg by mouth daily.  Marland Kitchen atorvastatin (LIPITOR) 20 MG tablet Take 20 mg by mouth at bedtime.  . carvedilol (COREG) 6.25 MG tablet Take 6.25 mg by mouth 2 (two) times daily with a meal.  . ELIQUIS 5 MG TABS tablet TAKE 2 TABS (10MG ) BY MOUTH TWICE DAILY FOR 7 DAYS THEN DECREASE TO 1 TABLET (5 MG) TWICE DAILY  . levothyroxine (SYNTHROID, LEVOTHROID) 150 MCG tablet Take 150 mcg by mouth daily before breakfast.  . warfarin (COUMADIN) 5 MG tablet Take 1 tablet (5 mg total) by mouth daily. Or as directed by coumadin clinic.    Past Medical History:  Diagnosis Date  . AAA (abdominal aortic aneurysm) ([place])   . Aortic calcification ([place]) 05/01/2013  . Arthritis    "hands; not dx'd" (12/26/2012)  . Bilateral renal artery stenosis ([place])    a. 06/2013 Angio/PTA: LRA: 95 (5x18 Herculink stent), RRA 60ost, celiac/SMA nl, RCIA patent stents extending into dist Ao, RIIA 70ost, REIA min irregs, LCIA patent stent, LIIA 60-70ost, LEIA min irregs.  . CHF (congestive heart failure) ([place])    Patient states he doesn't  have CHF anymore per cardiologist.  . Coronary artery disease   . Hypertension   . Hypothyroidism   . Intraventricular hemorrhage ([place]) 07/12/2012  . Peripheral arterial disease ([place])    a. Previous left lower extremity stenting by Dr. Jamal Collin;  b. 12/2012 s/p bilat ostial common iliac stenting.  . Pneumonia   . Right leg DVT ([place]) 08/13/2012  . Subclavian artery stenosis, left ([place]) 05/2012   Status post stenting of the ostium and self-expanding stent placement to the left axillary artery  . Tobacco abuse    d/c 2012  . Venous insufficiency     Past Surgical History:  Procedure Laterality Date  . ABDOMINAL ANGIOGRAM  07/10/2013   WITH BI-FEMORAL RUNOFF       DR ARIDA  . ABDOMINAL AORTAGRAM N/A 12/26/2012   Procedure: ABDOMINAL Maxcine Ham;  Surgeon: Wellington Hampshire, MD;  Location: [facility] CATH [facility];  Service: Cardiovascular;  Laterality: N/A;  . ABDOMINAL AORTAGRAM N/A 07/10/2013   Procedure: ABDOMINAL Maxcine Ham;  Surgeon: Wellington Hampshire, MD;  Location: [facility] CATH [facility];  Service: Cardiovascular;  Laterality: N/A;  . ANGIOPLASTY / STENTING FEMORAL  05/2012  . ANGIOPLASTY / STENTING ILIAC Bilateral 12/26/2012  . ARCH AORTOGRAM N/A 06/20/2012   Procedure: ARCH AORTOGRAM;  Surgeon: Wellington Hampshire, MD;  Location: [facility] CATH [facility];  Service: Cardiovascular;  Laterality: N/A;  . CARDIAC CATHETERIZATION  2/14   MC  . CHOLECYSTECTOMY  1990's  . CORONARY ARTERY BYPASS GRAFT  07/11/2011   Procedure: CORONARY ARTERY BYPASS GRAFTING (CABG);  Surgeon: Grace Isaac, MD;  Location: [facility];  Service: Open Heart Surgery;  Laterality: N/A;  Times 3. On Pump. Using right greater saphenous vein and left internal mammary artery.   Marland Kitchen HERNIA REPAIR    . INSERTION OF ILIAC STENT Bilateral 12/26/2012   Procedure: INSERTION OF ILIAC STENT;  Surgeon: Wellington Hampshire, MD;  Location: [facility] C[facility] LAB;  Service: Cardiovascular;  Laterality: Bilateral;  Bilateral Common Iliac Artery  . LEFT AND RIGHT HEART CATHETERIZATION WITH CORONARY  ANGIOGRAM  07/07/2011   Procedure: LEFT AND RIGHT HEART CATHETERIZATION WITH CORONARY ANGIOGRAM;  Surgeon: Minna Merritts, MD;  Location: [facility] LAB;  Service: Cardiovascular;;  . LOWER EXTREMITY VENOGRAPHY Bilateral 12/30/2016   Procedure: Lower Extremity Venography;  Surgeon: Katha Cabal, MD;  Location: [facility] LAB;  Service: Cardiovascular;  Laterality: Bilateral;  . PERCUTANEOUS STENT INTERVENTION  06/20/2012   Procedure: PERCUTANEOUS STENT INTERVENTION;  Surgeon: Wellington Hampshire, MD;  Location: [facility] CATH LAB;  Service: Cardiovascular;;  . RENAL ARTERY ANGIOPLASTY Left 07/10/2013   DR ARIDA  . SUBCLAVIAN ARTERY STENT  05/2012   "2 stents" (12/26/2012)  . TOOTH EXTRACTION  Spring 2017   mulitple (6)  . VENA CAVA FILTER PLACEMENT  07/2012   Removable    Social History Social History  Substance Use Topics  . Smoking status: Former Smoker    Packs/day: 1.00    Years: 50.00    Types: Cigarettes    Quit date: 12/25/2010  . Smokeless tobacco: Never Used  . Alcohol use No    Family History Family History  Problem Relation Age of Onset  . Alcohol abuse Father   . Cirrhosis Father   . Hypothyroidism Sister     Allergies  Allergen Reactions  . Plavix [Clopidogrel Bisulfate]     Brain hemorrhage prev while on plavix and aspirin     REVIEW OF SYSTEMS (Negative unless checked)  Constitutional: [] Weight loss  [] Fever  [] Chills Cardiac: [] Chest pain   [] Chest pressure   [] Palpitations   [] Shortness of breath when laying flat   [] Shortness of breath with exertion. Vascular:  [] Pain in legs with walking   [] Pain in legs at rest  [x] History of DVT   [] Phlebitis   [x] Swelling in legs   [] Varicose veins   [] Non-healing ulcers Pulmonary:   [] Uses home oxygen   [] Productive cough   [] Hemoptysis   [] Wheeze  [] COPD   [] Asthma Neurologic:  [] Dizziness   [] Seizures   [] History of stroke   [] History of TIA  [] Aphasia   [] Vissual changes   [] Weakness or numbness in arm   [] Weakness or  numbness in leg Musculoskeletal:   [] Joint swelling   [] Joint pain   [] Low back pain Hematologic:  [] Easy bruising  [] Easy bleeding   [] Hypercoagulable state   [] Anemic Gastrointestinal:  [] Diarrhea   [] Vomiting  [] Gastroesophageal reflux/heartburn   [] Difficulty swallowing. Genitourinary:  [] Chronic kidney disease   [] Difficult urination  [] Frequent urination   [] Blood in urine Skin:  [] Rashes   [] Ulcers  Psychological:  [] History of anxiety   []  History of major depression.  Physical Examination  Vitals:   01/19/17 1538  BP: (!) 149/70  Pulse: 74  Resp: 16  Weight: 79.4 kg (175 lb)  Height: 5\' 9"  (1.753 m)   Body mass index  is 25.84 kg/m. Gen: WD/WN, NAD Head: [place]/AT, No temporalis wasting.  Ear/Nose/Throat: Hearing grossly intact, nares w/o erythema or drainage Eyes: PER, EOMI, sclera nonicteric.  Neck: Supple, no large masses.   Pulmonary:  Good air movement, no audible wheezing bilaterally, no use of accessory muscles.  Cardiac: RRR, no JVD Vascular:  Mild venous stasis changes to the legs bilaterally.  2+ soft pitting edema Vessel Right Left  Radial Palpable Palpable  PT Palpable Palpable  DP Palpable Palpable  Gastrointestinal: Non-distended. No guarding/no peritoneal signs.  Musculoskeletal: M/S 5/5 throughout.  No deformity or atrophy.  Neurologic: CN 2-12 intact. Symmetrical.  Speech is fluent. Motor exam as listed above. Psychiatric: Judgment intact, Mood & affect appropriate for pt's clinical situation. Dermatologic: No rashes or ulcers noted.  No changes consistent with cellulitis. Lymph : No lichenification or skin changes of chronic lymphedema.  CBC Lab Results  Component Value Date   WBC 9.5 12/31/2016   HGB 12.4 (L) 12/31/2016   HCT 36.1 (L) 12/31/2016   MCV 89.2 12/31/2016   PLT 154 12/31/2016    BMET    Component Value Date/Time   NA 136 12/30/2016 0103   NA 140 07/28/2015 1006   NA 136 06/03/2013 0454   K 4.3 12/30/2016 0103   K 4.1  06/03/2013 0454   CL 105 12/30/2016 0103   CL 106 06/03/2013 0454   CO2 25 12/30/2016 0103   CO2 27 06/03/2013 0454   GLUCOSE 109 (H) 12/30/2016 0103   GLUCOSE 80 06/03/2013 0454   BUN 25 (H) 12/30/2016 0103   BUN 18 07/28/2015 1006   BUN 22 (H) 06/03/2013 0454   CREATININE 1.33 (H) 12/30/2016 0103   CREATININE 1.59 (H) 06/03/2013 0454   CREATININE 1.02 04/22/2011 1507   CALCIUM 8.6 (L) 12/30/2016 0103   CALCIUM 8.8 06/03/2013 0454   GFRNONAA 51 (L) 12/30/2016 0103   GFRNONAA 43 (L) 06/03/2013 0454   GFRAA 59 (L) 12/30/2016 0103   GFRAA 50 (L) 06/03/2013 0454   CrCl cannot be calculated (Patient's most recent lab result is older than the maximum 21 days allowed.).  COAG Lab Results  Component Value Date   INR 1.73 12/29/2016   INR 1.2 06/27/2013   INR 1.3 06/02/2013    Radiology Dg Chest 2 View  Result Date: 12/29/2016 CLINICAL DATA:  History of DVT. Bilateral leg pain and shortness of breath. EXAM: CHEST  2 VIEW COMPARISON:  December 22, 2016 FINDINGS: No pneumothorax. Vascular stents again seen on the left. A new nodular density over the lateral left lung base is favored to represent a nipple shadow. Blunting of the left costophrenic angle may represent a small effusion versus pleural thickening. The heart, hila, and mediastinum are unchanged. No other changes within the lungs. IMPRESSION: 1. Nodular density over the lateral left lung base is favored to represent a nipple shadow. Recommend repeat imaging with nipple markers before discharge. 2. Pleural thickening versus small effusion on the left. 3. No other acute abnormalities. Electronically Signed   By: Dorise Bullion III M.D   On: 12/29/2016 18:41   Mr Brain W And Wo Contrast  Result Date: 12/24/2016 CLINICAL DATA:  76 y/o M; periodic dizziness and loss of dexterity in the hands for 2 days. EXAM: MRI HEAD WITHOUT AND WITH CONTRAST TECHNIQUE: Multiplanar, multiecho pulse sequences of the brain and surrounding structures were  obtained without and with intravenous contrast. CONTRAST:  23mL MULTIHANCE GADOBENATE DIMEGLUMINE 529 MG/ML IV SOLN COMPARISON:  12/22/2016 CT of the head.  07/13/2012 MRI of the head. FINDINGS: Brain: No acute infarction, hemorrhage, hydrocephalus, extra-axial collection or mass lesion. A few nonspecific foci of T2 FLAIR hyperintense signal abnormality in subcortical and periventricular white matter is compatible with mild chronic microvascular ischemic changes for age. Mild brain parenchymal volume loss. Small stable chronic white matter infarct within the right posterior frontal periventricular white matter and small chronic lacunar infarcts in the right cerebellar hemisphere. No abnormal enhancement. Vascular: Normal flow voids. Skull and upper cervical spine: Normal marrow signal. Sinuses/Orbits: Negative. Other: None. IMPRESSION: 1. No acute intracranial abnormality or abnormal enhancement of the brain. 2. Mild for age chronic microvascular ischemic changes and mild parenchymal volume loss of the brain. Small chronic lacunar infarcts in right frontal periventricular white matter and right cerebellum. Electronically Signed   By: Kristine Garbe M.D.   On: 12/24/2016 01:35   Nm Pulmonary Perf And Vent  Result Date: 12/30/2016 CLINICAL DATA:  Shortness of breath. Patient with bilateral lower extremity deep venous thrombosis. EXAM: NUCLEAR MEDICINE VENTILATION - PERFUSION LUNG SCAN TECHNIQUE: Ventilation images were obtained in multiple projections using inhaled aerosol Tc-17m DTPA. Perfusion images were obtained in multiple projections after intravenous injection of Tc-37m MAA. RADIOPHARMACEUTICALS:  39.2 MCi Technetium-69m DTPA aerosol inhalation and 5.0 mCi Technetium-50m MAA IV COMPARISON:  PA and lateral chest earlier today and CT chest 09/13/2012. FINDINGS: Ventilation: Ventilation in right upper lobe is decreased relative to the right likely related to emphysematous disease. Ventilation is  otherwise unremarkable. Perfusion: No wedge shaped peripheral perfusion defects to suggest acute pulmonary embolism. Perfusion to the right upper lobe is slightly decreased relative to the left. IMPRESSION: Very low probability for pulmonary embolus. Electronically Signed   By: Inge Rise M.D.   On: 12/30/2016 14:32   US Venous Img Lower Bilateral  Result Date: 12/28/2016 CLINICAL DATA:  Leg swelling and pain for 5 days EXAM: BILATERAL LOWER EXTREMITY VENOUS DOPPLER ULTRASOUND TECHNIQUE: Gray-scale sonography with graded compression, as well as color Doppler and duplex ultrasound were performed to evaluate the lower extremity deep venous systems from the level of the common femoral vein and including the common femoral, femoral, profunda femoral, popliteal and calf veins including the posterior tibial, peroneal and gastrocnemius veins when visible. The superficial great saphenous vein was also interrogated. Spectral Doppler was utilized to evaluate flow at rest and with distal augmentation maneuvers in the common femoral, femoral and popliteal veins. COMPARISON:  07/27/2012 FINDINGS: RIGHT LOWER EXTREMITY Common Femoral Vein: Noncompressible with occlusive thrombus present. Saphenofemoral Junction: Noncompressible with occlusive thrombus present. Profunda Femoral Vein: Noncompressible with occlusive thrombus present. Femoral Vein: Noncompressible with occlusive thrombus present. Popliteal Vein: No evidence of thrombus. Normal compressibility, respiratory phasicity and response to augmentation. Calf Veins: No evidence of thrombus. Normal compressibility and flow on color Doppler imaging. LEFT LOWER EXTREMITY Common Femoral Vein: Noncompressible with occlusive thrombus present. Saphenofemoral Junction: Noncompressible with occlusive thrombus present. Profunda Femoral Vein: Noncompressible with occlusive thrombus present Femoral Vein: No evidence of thrombus. Normal compressibility, respiratory phasicity and  response to augmentation. Popliteal Vein: Noncompressible with occlusive thrombus present. Calf Veins: No evidence of thrombus. Normal compressibility and flow on color Doppler imaging. IMPRESSION: 1. Extensive occlusive thrombus within the right common femoral and femoral veins with extension of occlusive thrombus into the saphenous femoral junction and image profunda vein. Deep veins are patent from the popliteal vein to the calf. 2. Positive for left lower extremity DVT with occlusive thrombus present in the left common femoral vein an visualized portions of the staff  in all femoral junction and profunda vein. Occlusive thrombus also present within the left popliteal vein. Left femoral vein and calf veins appear patent. Critical Value/emergent results were called by telephone at the time of interpretation on 12/28/2016 at 4:35 pm to Dr. Rockey Situ who was covering for [facility] , who verbally acknowledged these results. Pt reportedly has IVC filter in place. Electronically Signed   By: Donavan Foil M.D.   On: 12/28/2016 16:36    Assessment/Plan 1. Acute deep vein thrombosis (DVT) of both lower extremities, unspecified vein (HCC) Recommend:   No surgery or intervention at this point in time.  IVC filter is not indicated at present.  Patient's duplex ultrasound of the venous system shows DVT from the popliteal to the femoral veins.  The patient is initiated on anticoagulation   Elevation was stressed, use of a recliner was discussed.  I have had a long discussion with the patient regarding DVT and post phlebitic changes such as swelling and why it  causes symptoms such as pain.  The patient will wear graduated compression stockings class 1 (20-30 mmHg), beginning after three full days of anticoagulation, on a daily basis a prescription was given. The patient will  beginning wearing the stockings first thing in the morning and removing them in the evening. The patient is instructed specifically not  to sleep in the stockings.  In addition, behavioral modification including elevation during the day and avoidance of prolonged dependency will be initiated.    The patient will continue anticoagulation for now as there have not been any problems or complications at this point.   - VAS [place] LOWER EXTREMITY VENOUS (DVT); Future  2. Coronary artery disease involving native coronary artery of native heart without angina pectoris Continue cardiac and antihypertensive medications as already ordered and reviewed, no changes at this time.  Continue statin as ordered and reviewed, no changes at this time  Nitrates PRN for chest pain   3. Abdominal aortic aneurysm (AAA) without rupture ([place]) No surgery or intervention at this time. The patient has an asymptomatic abdominal aortic aneurysm that is less than 4 cm in maximal diameter.  I have discussed the natural history of abdominal aortic aneurysm and the small risk of rupture for aneurysm less than 5 cm in size.  However, as these small aneurysms tend to enlarge over time, continued surveillance with ultrasound or CT scan is mandatory.  I have also discussed optimizing medical management with hypertension and lipid control and the importance of abstinence from tobacco.  The patient is also encouraged to exercise a minimum of 30 minutes 4 times a week.  Should the patient develop new onset abdominal or back pain or signs of peripheral embolization they are instructed to seek medical attention immediately and to alert the physician providing care that they have an aneurysm.  The patient voices their understanding. The patient will return in 12 months with an aortic duplex.     Hortencia Pilar, MD  01/21/2017 3:22 PM

## 2017-01-24 ENCOUNTER — Ambulatory Visit: Payer: Medicare Other | Admitting: Cardiovascular Disease

## 2017-01-27 ENCOUNTER — Ambulatory Visit (INDEPENDENT_AMBULATORY_CARE_PROVIDER_SITE_OTHER): Payer: Medicare Other

## 2017-01-27 DIAGNOSIS — I82403 Acute embolism and thrombosis of unspecified deep veins of lower extremity, bilateral: Secondary | ICD-10-CM | POA: Insufficient documentation

## 2017-01-27 DIAGNOSIS — Z7901 Long term (current) use of anticoagulants: Secondary | ICD-10-CM

## 2017-01-27 DIAGNOSIS — I480 Paroxysmal atrial fibrillation: Secondary | ICD-10-CM

## 2017-01-27 LAB — POCT INR: INR: 2.7

## 2017-01-27 NOTE — Patient Instructions (Signed)
Pre visit review using our clinic review tool, if applicable. No additional management support is needed unless otherwise documented below in the visit note.   INR 2.7 today.  Patient currently transitioning over to coumadin from Eliquis.  He finished his 3 day Eliquis overlap on Wednesday and has been on 5mg  of Coumadin X 4 days.  Will decrease him to 2.5mg  daily until recheck on Tuesday 01/31/17.  Educated patient and wife extensively on coumadin and all risks associated with medication including special dietary and medication considerations.  Both verbalize understanding and deny any abnormal bleeding or bruising at the present.    Handouts given regarding coumadin and special dietary considerations.  Ample time was given to review any questions/and concerns with patient and wife before leaving.

## 2017-01-31 ENCOUNTER — Ambulatory Visit (INDEPENDENT_AMBULATORY_CARE_PROVIDER_SITE_OTHER): Payer: Medicare Other | Admitting: General Practice

## 2017-01-31 DIAGNOSIS — Z7901 Long term (current) use of anticoagulants: Secondary | ICD-10-CM | POA: Diagnosis not present

## 2017-01-31 DIAGNOSIS — I480 Paroxysmal atrial fibrillation: Secondary | ICD-10-CM

## 2017-01-31 LAB — POCT INR: INR: 2.3

## 2017-01-31 NOTE — Progress Notes (Signed)
I do not see an INR?

## 2017-01-31 NOTE — Patient Instructions (Signed)
Pre visit review using our clinic review tool, if applicable. No additional management support is needed unless otherwise documented below in the visit note. 

## 2017-02-07 ENCOUNTER — Ambulatory Visit (INDEPENDENT_AMBULATORY_CARE_PROVIDER_SITE_OTHER): Payer: Medicare Other

## 2017-02-07 ENCOUNTER — Ambulatory Visit: Payer: Medicare Other

## 2017-02-07 DIAGNOSIS — I480 Paroxysmal atrial fibrillation: Secondary | ICD-10-CM

## 2017-02-07 DIAGNOSIS — I82403 Acute embolism and thrombosis of unspecified deep veins of lower extremity, bilateral: Secondary | ICD-10-CM

## 2017-02-07 DIAGNOSIS — Z7901 Long term (current) use of anticoagulants: Secondary | ICD-10-CM | POA: Diagnosis not present

## 2017-02-07 LAB — POCT INR: INR: 2.1

## 2017-02-08 NOTE — Patient Instructions (Signed)
Pre visit review using our clinic review tool, if applicable. No additional management support is needed unless otherwise documented below in the visit note. 

## 2017-02-14 ENCOUNTER — Ambulatory Visit (INDEPENDENT_AMBULATORY_CARE_PROVIDER_SITE_OTHER): Payer: Medicare Other

## 2017-02-14 DIAGNOSIS — Z7901 Long term (current) use of anticoagulants: Secondary | ICD-10-CM

## 2017-02-14 DIAGNOSIS — I480 Paroxysmal atrial fibrillation: Secondary | ICD-10-CM

## 2017-02-14 LAB — POCT INR: INR: 2.1

## 2017-02-14 NOTE — Patient Instructions (Signed)
Pre visit review using our clinic review tool, if applicable. No additional management support is needed unless otherwise documented below in the visit note. 

## 2017-03-02 ENCOUNTER — Other Ambulatory Visit: Payer: Self-pay | Admitting: Cardiovascular Disease

## 2017-03-02 DIAGNOSIS — H2513 Age-related nuclear cataract, bilateral: Secondary | ICD-10-CM | POA: Diagnosis not present

## 2017-03-02 NOTE — Telephone Encounter (Signed)
Unsure who prescribed this medication. Please advise if you want to refill this.

## 2017-03-03 ENCOUNTER — Ambulatory Visit (INDEPENDENT_AMBULATORY_CARE_PROVIDER_SITE_OTHER): Payer: Medicare Other

## 2017-03-03 ENCOUNTER — Other Ambulatory Visit: Payer: Self-pay

## 2017-03-03 DIAGNOSIS — Z7901 Long term (current) use of anticoagulants: Secondary | ICD-10-CM

## 2017-03-03 DIAGNOSIS — I480 Paroxysmal atrial fibrillation: Secondary | ICD-10-CM

## 2017-03-03 LAB — POCT INR: INR: 1.9

## 2017-03-04 NOTE — Patient Instructions (Signed)
Pre visit review using our clinic review tool, if applicable. No additional management support is needed unless otherwise documented below in the visit note. 

## 2017-03-24 ENCOUNTER — Ambulatory Visit (INDEPENDENT_AMBULATORY_CARE_PROVIDER_SITE_OTHER): Payer: Medicare Other

## 2017-03-24 DIAGNOSIS — I480 Paroxysmal atrial fibrillation: Secondary | ICD-10-CM

## 2017-03-24 DIAGNOSIS — Z7901 Long term (current) use of anticoagulants: Secondary | ICD-10-CM

## 2017-03-24 LAB — POCT INR: INR: 1.6

## 2017-03-24 NOTE — Patient Instructions (Signed)
INR today is 1.6  Take 1 (5mg ) pill today for a boost (11/30 and then increase your weekly dose to taking 1/2 pill (2.5mg ) daily EXCEPT for 1 pill (5mg ) on Wednesdays and Fridays.  Recheck in 2 weeks.    Patient verbalizes understanding of all instructions given today and denies any changes to diet, medications or general health.

## 2017-04-06 ENCOUNTER — Ambulatory Visit (INDEPENDENT_AMBULATORY_CARE_PROVIDER_SITE_OTHER): Payer: Medicare Other | Admitting: General Practice

## 2017-04-06 DIAGNOSIS — Z7901 Long term (current) use of anticoagulants: Secondary | ICD-10-CM | POA: Diagnosis not present

## 2017-04-06 DIAGNOSIS — I480 Paroxysmal atrial fibrillation: Secondary | ICD-10-CM

## 2017-04-06 LAB — POCT INR: INR: 1.5

## 2017-04-06 NOTE — Patient Instructions (Addendum)
Pre visit review using our clinic review tool, if applicable. No additional management support is needed unless otherwise documented below in the visit note.  Take 1 (5mg ) pill today (12/13) for a boost then increase your weekly dose to taking 1/2 pill (2.5mg ) daily EXCEPT for 1 pill (5mg ) on Monday/Wednesdays and Fridays.  Recheck in 2 weeks.

## 2017-04-07 ENCOUNTER — Ambulatory Visit: Payer: Medicare Other

## 2017-04-20 ENCOUNTER — Ambulatory Visit (INDEPENDENT_AMBULATORY_CARE_PROVIDER_SITE_OTHER): Payer: Medicare Other | Admitting: General Practice

## 2017-04-20 DIAGNOSIS — Z7901 Long term (current) use of anticoagulants: Secondary | ICD-10-CM | POA: Diagnosis not present

## 2017-04-20 DIAGNOSIS — I480 Paroxysmal atrial fibrillation: Secondary | ICD-10-CM

## 2017-04-20 LAB — POCT INR: INR: 2.3

## 2017-04-20 NOTE — Patient Instructions (Addendum)
Pre visit review using our clinic review tool, if applicable. No additional management support is needed unless otherwise documented below in the visit note.  Continue to take 1/2 pill (2.5mg ) daily EXCEPT for 1 pill (5mg ) on Monday/Wednesdays and Fridays.  Recheck in 4 weeks.

## 2017-05-18 ENCOUNTER — Ambulatory Visit (INDEPENDENT_AMBULATORY_CARE_PROVIDER_SITE_OTHER): Payer: Medicare Other | Admitting: General Practice

## 2017-05-18 DIAGNOSIS — I480 Paroxysmal atrial fibrillation: Secondary | ICD-10-CM

## 2017-05-18 DIAGNOSIS — Z7901 Long term (current) use of anticoagulants: Secondary | ICD-10-CM | POA: Diagnosis not present

## 2017-05-18 LAB — POCT INR: INR: 1.6

## 2017-05-18 NOTE — Patient Instructions (Addendum)
Pre visit review using our clinic review tool, if applicable. No additional management support is needed unless otherwise documented below in the visit note.  Take 1 tablet today (1/24) and take 1 1/2 tablets tomorrow (1/25) and the change dosage and take take 1/2 pill (2.5mg ) daily EXCEPT for 1 pill (5mg ) on Monday/Wednesdays and Fridays/Saturdays.  Recheck in 4 weeks.

## 2017-05-23 ENCOUNTER — Other Ambulatory Visit: Payer: Self-pay | Admitting: Family Medicine

## 2017-06-01 ENCOUNTER — Other Ambulatory Visit: Payer: Self-pay | Admitting: Family Medicine

## 2017-06-02 NOTE — Telephone Encounter (Signed)
Patient is compliant with coumadin management.  Will refill X 6 months.   

## 2017-06-02 NOTE — Telephone Encounter (Signed)
Pt states he is going to run our of his warfarin (COUMADIN) 5 MG tablet  This weekend. Needs sent to  [facility]

## 2017-06-15 ENCOUNTER — Ambulatory Visit (INDEPENDENT_AMBULATORY_CARE_PROVIDER_SITE_OTHER): Payer: Medicare Other | Admitting: General Practice

## 2017-06-15 DIAGNOSIS — Z7901 Long term (current) use of anticoagulants: Secondary | ICD-10-CM | POA: Diagnosis not present

## 2017-06-15 DIAGNOSIS — I480 Paroxysmal atrial fibrillation: Secondary | ICD-10-CM

## 2017-06-15 LAB — POCT INR: INR: 3.3

## 2017-06-15 NOTE — Patient Instructions (Addendum)
Pre visit review using our clinic review tool, if applicable. No additional management support is needed unless otherwise documented below in the visit note.  Skip coumadin today and then continue to take 1/2 pill (2.5mg ) daily EXCEPT for 1 pill (5mg ) on Monday/Wednesdays and Fridays/Saturdays.  Recheck in 3 to 4 weeks.

## 2017-07-04 ENCOUNTER — Other Ambulatory Visit: Payer: Self-pay | Admitting: Cardiovascular Disease

## 2017-07-06 ENCOUNTER — Ambulatory Visit: Payer: Medicare Other | Admitting: Internal Medicine

## 2017-07-13 ENCOUNTER — Ambulatory Visit (INDEPENDENT_AMBULATORY_CARE_PROVIDER_SITE_OTHER): Payer: Medicare Other | Admitting: General Practice

## 2017-07-13 DIAGNOSIS — Z7901 Long term (current) use of anticoagulants: Secondary | ICD-10-CM | POA: Diagnosis not present

## 2017-07-13 DIAGNOSIS — I480 Paroxysmal atrial fibrillation: Secondary | ICD-10-CM | POA: Diagnosis not present

## 2017-07-13 LAB — POCT INR: INR: 1.9

## 2017-07-13 NOTE — Patient Instructions (Addendum)
Pre visit review using our clinic review tool, if applicable. No additional management support is needed unless otherwise documented below in the visit note.  Take 1 tablet today (3/21) and then  continue to take 1/2 pill (2.5mg ) daily EXCEPT for 1 pill (5mg ) on Monday/Wednesdays and Fridays/Saturdays.  Recheck in 3 to 4 weeks.

## 2017-07-17 ENCOUNTER — Encounter (INDEPENDENT_AMBULATORY_CARE_PROVIDER_SITE_OTHER): Payer: Self-pay | Admitting: Vascular Surgery

## 2017-07-17 ENCOUNTER — Ambulatory Visit (INDEPENDENT_AMBULATORY_CARE_PROVIDER_SITE_OTHER): Payer: Medicare Other

## 2017-07-17 ENCOUNTER — Ambulatory Visit (INDEPENDENT_AMBULATORY_CARE_PROVIDER_SITE_OTHER): Payer: Medicare Other | Admitting: Vascular Surgery

## 2017-07-17 VITALS — BP 137/75 | HR 76 | Resp 17 | Ht 69.0 in | Wt 180.0 lb

## 2017-07-17 DIAGNOSIS — I2510 Atherosclerotic heart disease of native coronary artery without angina pectoris: Secondary | ICD-10-CM

## 2017-07-17 DIAGNOSIS — I10 Essential (primary) hypertension: Secondary | ICD-10-CM

## 2017-07-17 DIAGNOSIS — I82403 Acute embolism and thrombosis of unspecified deep veins of lower extremity, bilateral: Secondary | ICD-10-CM | POA: Diagnosis not present

## 2017-07-17 DIAGNOSIS — I82513 Chronic embolism and thrombosis of femoral vein, bilateral: Secondary | ICD-10-CM | POA: Diagnosis not present

## 2017-07-17 DIAGNOSIS — J449 Chronic obstructive pulmonary disease, unspecified: Secondary | ICD-10-CM

## 2017-07-17 NOTE — Progress Notes (Signed)
MRN : [id number]  [name] is a 77 y.o. (12-27-1940) male who presents with chief complaint of  Chief Complaint  Patient presents with  . Follow-up    DVT study  .  History of Present Illness:  The patient presents to the office for evaluation of DVT.  DVT was identified at [facility] by Duplex ultrasound.  The initial symptoms were pain and swelling in the lower extremity.  The patient notes the leg continues to be very painful with dependency and swells quite a bite.  Symptoms are much better with elevation.  The patient notes minimal edema in the morning which steadily worsens throughout the day.    The patient has not been using compression therapy at this point.  No SOB or pleuritic chest pains.  No cough or hemoptysis.  No blood per rectum or blood in any sputum.  No excessive bruising per the patient.       Current Meds  Medication Sig  . amLODipine (NORVASC) 5 MG tablet Take 5 mg by mouth daily.  Marland Kitchen atorvastatin (LIPITOR) 20 MG tablet TAKE 1 TABLET BY MOUTH DAILY AT 6PM  . carvedilol (COREG) 6.25 MG tablet TAKE 1 TABLET TWICE DAILY WITH MEALS  . levothyroxine (SYNTHROID, LEVOTHROID) 150 MCG tablet Take 150 mcg by mouth daily before breakfast.  . warfarin (COUMADIN) 5 MG tablet TAKE ONE TABLET BY MOUTH EVERY DAY OR ASDIRECTED BY COUMADIN CLINIC    Past Medical History:  Diagnosis Date  . AAA (abdominal aortic aneurysm) ([place])   . Aortic calcification ([place]) 05/01/2013  . Arthritis    "hands; not dx'd" (12/26/2012)  . Bilateral renal artery stenosis ([place])    a. 06/2013 Angio/PTA: LRA: 95 (5x18 Herculink stent), RRA 60ost, celiac/SMA nl, RCIA patent stents extending into dist Ao, RIIA 70ost, REIA min irregs, LCIA patent stent, LIIA 60-70ost, LEIA min irregs.  . CHF (congestive heart failure) ([place])    Patient states he doesn't have CHF anymore per cardiologist.  . Coronary artery disease   . Hypertension   . Hypothyroidism   . Intraventricular hemorrhage ([place]) 07/12/2012    . Peripheral arterial disease ([place])    a. Previous left lower extremity stenting by Dr. Jamal Collin;  b. 12/2012 s/p bilat ostial common iliac stenting.  . Pneumonia   . Right leg DVT ([place]) 08/13/2012  . Subclavian artery stenosis, left ([place]) 05/2012   Status post stenting of the ostium and self-expanding stent placement to the left axillary artery  . Tobacco abuse    d/c 2012  . Venous insufficiency     Past Surgical History:  Procedure Laterality Date  . ABDOMINAL ANGIOGRAM  07/10/2013   WITH BI-FEMORAL RUNOFF       DR ARIDA  . ABDOMINAL AORTAGRAM N/A 12/26/2012   Procedure: ABDOMINAL Maxcine Ham;  Surgeon: Wellington Hampshire, MD;  Location: [place] CATH LAB;  Service: Cardiovascular;  Laterality: N/A;  . ABDOMINAL AORTAGRAM N/A 07/10/2013   Procedure: ABDOMINAL Maxcine Ham;  Surgeon: Wellington Hampshire, MD;  Location: [place] CATH LAB;  Service: Cardiovascular;  Laterality: N/A;  . ANGIOPLASTY / STENTING FEMORAL  05/2012  . ANGIOPLASTY / STENTING ILIAC Bilateral 12/26/2012  . ARCH AORTOGRAM N/A 06/20/2012   Procedure: ARCH AORTOGRAM;  Surgeon: Wellington Hampshire, MD;  Location: [place] CATH LAB;  Service: Cardiovascular;  Laterality: N/A;  . CARDIAC CATHETERIZATION  2/14   MC  . CHOLECYSTECTOMY  1990's  . CORONARY ARTERY BYPASS GRAFT  07/11/2011   Procedure: CORONARY ARTERY BYPASS GRAFTING (CABG);  Surgeon:  Grace Isaac, MD;  Location: [place];  Service: Open Heart Surgery;  Laterality: N/A;  Times 3. On Pump. Using right greater saphenous vein and left internal mammary artery.   Marland Kitchen HERNIA REPAIR    . INSERTION OF ILIAC STENT Bilateral 12/26/2012   Procedure: INSERTION OF ILIAC STENT;  Surgeon: Wellington Hampshire, MD;  Location: [place] CATH LAB;  Service: Cardiovascular;  Laterality: Bilateral;  Bilateral Common Iliac Artery  . LEFT AND RIGHT HEART CATHETERIZATION WITH CORONARY ANGIOGRAM  07/07/2011   Procedure: LEFT AND RIGHT HEART CATHETERIZATION WITH CORONARY ANGIOGRAM;  Surgeon: Minna Merritts, MD;  Location: [place] CATH  LAB;  Service: Cardiovascular;;  . LOWER EXTREMITY VENOGRAPHY Bilateral 12/30/2016   Procedure: Lower Extremity Venography;  Surgeon: Katha Cabal, MD;  Location: [place] CV LAB;  Service: Cardiovascular;  Laterality: Bilateral;  . PERCUTANEOUS STENT INTERVENTION  06/20/2012   Procedure: PERCUTANEOUS STENT INTERVENTION;  Surgeon: Wellington Hampshire, MD;  Location: [place] CATH LAB;  Service: Cardiovascular;;  . RENAL ARTERY ANGIOPLASTY Left 07/10/2013   DR ARIDA  . SUBCLAVIAN ARTERY STENT  05/2012   "2 stents" (12/26/2012)  . TOOTH EXTRACTION  Spring 2017   mulitple (6)  . VENA CAVA FILTER PLACEMENT  07/2012   Removable    Social History Social History   Tobacco Use  . Smoking status: Former Smoker    Packs/day: 1.00    Years: 50.00    Pack years: 50.00    Types: Cigarettes    Last attempt to quit: 12/25/2010    Years since quitting: 6.5  . Smokeless tobacco: Never Used  Substance Use Topics  . Alcohol use: No  . Drug use: No    Family History Family History  Problem Relation Age of Onset  . Alcohol abuse Father   . Cirrhosis Father   . Hypothyroidism Sister     Allergies  Allergen Reactions  . Plavix [Clopidogrel Bisulfate]     Brain hemorrhage prev while on plavix and aspirin     REVIEW OF SYSTEMS (Negative unless checked)  Constitutional: [] Weight loss  [] Fever  [] Chills Cardiac: [] Chest pain   [] Chest pressure   [] Palpitations   [] Shortness of breath when laying flat   [] Shortness of breath with exertion. Vascular:  [] Pain in legs with walking   [] Pain in legs at rest  [] History of DVT   [] Phlebitis   [] Swelling in legs   [] Varicose veins   [] Non-healing ulcers Pulmonary:   [] Uses home oxygen   [] Productive cough   [] Hemoptysis   [] Wheeze  [] COPD   [] Asthma Neurologic:  [] Dizziness   [] Seizures   [] History of stroke   [] History of TIA  [] Aphasia   [] Vissual changes   [] Weakness or numbness in arm   [] Weakness or numbness in leg Musculoskeletal:   [] Joint swelling    [] Joint pain   [] Low back pain Hematologic:  [] Easy bruising  [] Easy bleeding   [] Hypercoagulable state   [] Anemic Gastrointestinal:  [] Diarrhea   [] Vomiting  [] Gastroesophageal reflux/heartburn   [] Difficulty swallowing. Genitourinary:  [] Chronic kidney disease   [] Difficult urination  [] Frequent urination   [] Blood in urine Skin:  [] Rashes   [] Ulcers  Psychological:  [] History of anxiety   []  History of major depression.  Physical Examination  Vitals:   07/17/17 1115  BP: 137/75  Pulse: 76  Resp: 17  Weight: 180 lb (81.6 kg)  Height: 5\' 9"  (1.753 m)   Body mass index is 26.58 kg/m. Gen: WD/WN, NAD Head: [place]/AT, No temporalis wasting.  Ear/Nose/Throat: Hearing grossly intact, nares w/o erythema or drainage Eyes: PER, EOMI, sclera nonicteric.  Neck: Supple, no large masses.   Pulmonary:  Good air movement, no audible wheezing bilaterally, no use of accessory muscles.  Cardiac: RRR, no JVD Vascular: scattered varicosities present bilaterally.  Mild venous stasis changes to the legs bilaterally.  2+ soft pitting edema Vessel Right Left  Radial Palpable Palpable  PT Palpable Palpable  DP Palpable Palpable  Gastrointestinal: Non-distended. No guarding/no peritoneal signs.  Musculoskeletal: M/S 5/5 throughout.  No deformity or atrophy.  Neurologic: CN 2-12 intact. Symmetrical.  Speech is fluent. Motor exam as listed above. Psychiatric: Judgment intact, Mood & affect appropriate for pt's clinical situation. Dermatologic: No rashes or ulcers noted.  No changes consistent with cellulitis. Lymph : No lichenification or skin changes of chronic lymphedema.  CBC Lab Results  Component Value Date   WBC 9.5 12/31/2016   HGB 12.4 (L) 12/31/2016   HCT 36.1 (L) 12/31/2016   MCV 89.2 12/31/2016   PLT 154 12/31/2016    BMET    Component Value Date/Time   NA 136 12/30/2016 0103   NA 140 07/28/2015 1006   NA 136 06/03/2013 0454   K 4.3 12/30/2016 0103   K 4.1 06/03/2013 0454   CL  105 12/30/2016 0103   CL 106 06/03/2013 0454   CO2 25 12/30/2016 0103   CO2 27 06/03/2013 0454   GLUCOSE 109 (H) 12/30/2016 0103   GLUCOSE 80 06/03/2013 0454   BUN 25 (H) 12/30/2016 0103   BUN 18 07/28/2015 1006   BUN 22 (H) 06/03/2013 0454   CREATININE 1.33 (H) 12/30/2016 0103   CREATININE 1.59 (H) 06/03/2013 0454   CREATININE 1.02 04/22/2011 1507   CALCIUM 8.6 (L) 12/30/2016 0103   CALCIUM 8.8 06/03/2013 0454   GFRNONAA 51 (L) 12/30/2016 0103   GFRNONAA 43 (L) 06/03/2013 0454   GFRAA 59 (L) 12/30/2016 0103   GFRAA 50 (L) 06/03/2013 0454   CrCl cannot be calculated (Patient's most recent lab result is older than the maximum 21 days allowed.).  COAG Lab Results  Component Value Date   INR 1.9 07/13/2017   INR 3.3 06/15/2017   INR 1.6 05/18/2017    Radiology No results found.    Assessment/Plan 1. Chronic deep vein thrombosis (DVT) of femoral vein of both lower extremities (HCC) No surgery or intervention at this point in time.  I have reviewed my discussion with the patient regarding venous insufficiency and why it causes symptoms. I have discussed with the patient the chronic skin changes that accompany venous insufficiency and the long term sequela such as ulceration. Patient will contnue wearing graduated compression stockings on a daily basis, as this has provided excellent control of his edema. The patient will put the stockings on first thing in the morning and removing them in the evening. The patient is reminded not to sleep in the stockings.  In addition, behavioral modification including elevation during the day will be initiated. Exercise is strongly encouraged.  Given the patient's good control and lack of any problems regarding the venous insufficiency and lymphedema a lymph pump in not need at this time.  The patient will follow up with me PRN should anything change.  The patient voices agreement with this plan.   2. Essential hypertension Continue  antihypertensive medications as already ordered, these medications have been reviewed and there are no changes at this time.   3. Coronary artery disease involving native coronary artery of native heart without angina  pectoris Continue cardiac and antihypertensive medications as already ordered and reviewed, no changes at this time.  Continue statin as ordered and reviewed, no changes at this time  Nitrates PRN for chest pain   4. Chronic obstructive pulmonary disease, unspecified COPD type ([place]) Continue pulmonary medications and aerosols as already ordered, these medications have been reviewed and there are no changes at this time.      Hortencia Pilar, MD  07/17/2017 9:56 PM

## 2017-07-20 ENCOUNTER — Ambulatory Visit: Payer: Medicare Other | Admitting: Cardiovascular Disease

## 2017-08-10 ENCOUNTER — Ambulatory Visit (INDEPENDENT_AMBULATORY_CARE_PROVIDER_SITE_OTHER): Payer: Medicare Other | Admitting: General Practice

## 2017-08-10 ENCOUNTER — Other Ambulatory Visit: Payer: Self-pay | Admitting: Cardiovascular Disease

## 2017-08-10 DIAGNOSIS — Z7901 Long term (current) use of anticoagulants: Secondary | ICD-10-CM

## 2017-08-10 DIAGNOSIS — I480 Paroxysmal atrial fibrillation: Secondary | ICD-10-CM | POA: Diagnosis not present

## 2017-08-10 LAB — POCT INR: INR: 1.6

## 2017-08-10 NOTE — Patient Instructions (Addendum)
Pre visit review using our clinic review tool, if applicable. No additional management support is needed unless otherwise documented below in the visit note.  Take 1 tablet today (4/18) and take 1 1/2 tablets tomorrow.  On Saturday start taking 1 tablet daily except 1/2 tablet on sundays and thursdays.  Re-check in 2 weeks.

## 2017-08-10 NOTE — Telephone Encounter (Signed)
Per September 2018 OV note from Dr. Fletcher Anon: "7. Essential hypertension: Blood pressure has been running low recently. Hold amlodipine." Patient has not been seen in our clinic since. Unable to refill prescription.

## 2017-08-10 NOTE — Telephone Encounter (Signed)
Please advise if ok to refill .  G[place] not original prescriber.

## 2017-08-11 ENCOUNTER — Other Ambulatory Visit: Payer: Self-pay | Admitting: Family Medicine

## 2017-08-24 ENCOUNTER — Ambulatory Visit (INDEPENDENT_AMBULATORY_CARE_PROVIDER_SITE_OTHER): Payer: Medicare Other

## 2017-08-24 DIAGNOSIS — Z7901 Long term (current) use of anticoagulants: Secondary | ICD-10-CM | POA: Diagnosis not present

## 2017-08-24 DIAGNOSIS — I480 Paroxysmal atrial fibrillation: Secondary | ICD-10-CM

## 2017-08-24 LAB — POCT INR: INR: 2

## 2017-08-24 NOTE — Patient Instructions (Signed)
INR today 2.0  Continue to take 1 tablet daily except 1/2 tablet on sundays and thursdays.  Re-check in 4 weeks.  Patient is doing well, denies any changes to diet, health or medications.

## 2017-09-18 ENCOUNTER — Telehealth: Payer: Self-pay

## 2017-09-18 DIAGNOSIS — Z125 Encounter for screening for malignant neoplasm of prostate: Secondary | ICD-10-CM

## 2017-09-18 DIAGNOSIS — I10 Essential (primary) hypertension: Secondary | ICD-10-CM

## 2017-09-18 DIAGNOSIS — E7800 Pure hypercholesterolemia, unspecified: Secondary | ICD-10-CM

## 2017-09-18 DIAGNOSIS — E038 Other specified hypothyroidism: Secondary | ICD-10-CM

## 2017-09-18 NOTE — Telephone Encounter (Signed)
CPE labs ordered and sent to PCP for approval. PCP please review lab orders and make modifications as necessary.  

## 2017-09-19 ENCOUNTER — Ambulatory Visit: Payer: PRIVATE HEALTH INSURANCE

## 2017-09-19 NOTE — Telephone Encounter (Signed)
done

## 2017-09-21 ENCOUNTER — Other Ambulatory Visit (INDEPENDENT_AMBULATORY_CARE_PROVIDER_SITE_OTHER): Payer: Medicare Other

## 2017-09-21 ENCOUNTER — Ambulatory Visit (INDEPENDENT_AMBULATORY_CARE_PROVIDER_SITE_OTHER): Payer: Medicare Other | Admitting: General Practice

## 2017-09-21 DIAGNOSIS — Z7901 Long term (current) use of anticoagulants: Secondary | ICD-10-CM | POA: Diagnosis not present

## 2017-09-21 DIAGNOSIS — E038 Other specified hypothyroidism: Secondary | ICD-10-CM

## 2017-09-21 DIAGNOSIS — I10 Essential (primary) hypertension: Secondary | ICD-10-CM

## 2017-09-21 DIAGNOSIS — I480 Paroxysmal atrial fibrillation: Secondary | ICD-10-CM

## 2017-09-21 DIAGNOSIS — Z125 Encounter for screening for malignant neoplasm of prostate: Secondary | ICD-10-CM | POA: Diagnosis not present

## 2017-09-21 DIAGNOSIS — E7800 Pure hypercholesterolemia, unspecified: Secondary | ICD-10-CM | POA: Diagnosis not present

## 2017-09-21 LAB — CBC WITH DIFFERENTIAL/PLATELET
BASOS ABS: 0 10*3/uL (ref 0.0–0.1)
Basophils Relative: 0.7 % (ref 0.0–3.0)
EOS ABS: 0.2 10*3/uL (ref 0.0–0.7)
Eosinophils Relative: 3.9 % (ref 0.0–5.0)
HCT: 48.5 % (ref 39.0–52.0)
Hemoglobin: 16 g/dL (ref 13.0–17.0)
LYMPHS ABS: 1.8 10*3/uL (ref 0.7–4.0)
LYMPHS PCT: 34.8 % (ref 12.0–46.0)
MCHC: 33 g/dL (ref 30.0–36.0)
MCV: 90.3 fl (ref 78.0–100.0)
Monocytes Absolute: 0.5 10*3/uL (ref 0.1–1.0)
Monocytes Relative: 9.6 % (ref 3.0–12.0)
NEUTROS ABS: 2.7 10*3/uL (ref 1.4–7.7)
NEUTROS PCT: 51 % (ref 43.0–77.0)
PLATELETS: 195 10*3/uL (ref 150.0–400.0)
RBC: 5.37 Mil/uL (ref 4.22–5.81)
RDW: 14.1 % (ref 11.5–15.5)
WBC: 5.3 10*3/uL (ref 4.0–10.5)

## 2017-09-21 LAB — BASIC METABOLIC PANEL
BUN: 22 mg/dL (ref 6–23)
CALCIUM: 9.2 mg/dL (ref 8.4–10.5)
CHLORIDE: 105 meq/L (ref 96–112)
CO2: 28 meq/L (ref 19–32)
CREATININE: 1.47 mg/dL (ref 0.40–1.50)
GFR: 49.46 mL/min — ABNORMAL LOW (ref >60.00)
GLUCOSE: 106 mg/dL — AB (ref 70–99)
Potassium: 5.2 mEq/L — ABNORMAL HIGH (ref 3.5–5.1)
Sodium: 139 mEq/L (ref 135–145)

## 2017-09-21 LAB — HEPATIC FUNCTION PANEL
ALK PHOS: 74 U/L (ref 39–117)
ALT: 8 U/L (ref 0–53)
AST: 10 U/L (ref 0–37)
Albumin: 4.2 g/dL (ref 3.5–5.2)
Bilirubin, Direct: 0.2 mg/dL (ref 0.0–0.3)
Total Bilirubin: 0.6 mg/dL (ref 0.2–1.2)
Total Protein: 6.5 g/dL (ref 6.0–8.3)

## 2017-09-21 LAB — T3, FREE: T3, Free: 3 pg/mL (ref 2.3–4.2)

## 2017-09-21 LAB — LIPID PANEL
CHOLESTEROL: 113 mg/dL (ref 0–200)
HDL: 54.2 mg/dL (ref >39.00)
LDL Cholesterol: 44 mg/dL (ref 0–99)
NonHDL: 59.2
TRIGLYCERIDES: 78 mg/dL (ref 0.0–149.0)
Total CHOL/HDL Ratio: 2
VLDL: 15.6 mg/dL (ref 0.0–40.0)

## 2017-09-21 LAB — POCT INR: INR: 2.2 (ref 2.0–3.0)

## 2017-09-21 LAB — PSA, MEDICARE: PSA: 5.09 ng/ml — ABNORMAL HIGH (ref 0.10–4.00)

## 2017-09-21 LAB — TSH: TSH: 1.23 u[IU]/mL (ref 0.35–4.50)

## 2017-09-21 LAB — T4, FREE: FREE T4: 1.54 ng/dL (ref 0.60–1.60)

## 2017-09-21 NOTE — Patient Instructions (Addendum)
Pre visit review using our clinic review tool, if applicable. No additional management support is needed unless otherwise documented below in the visit note.  Continue to take 1 tablet daily except 1/2 tablet on sundays and thursdays.  Re-check in 4 weeks.

## 2017-09-27 ENCOUNTER — Ambulatory Visit: Payer: PRIVATE HEALTH INSURANCE | Admitting: Family Medicine

## 2017-10-02 ENCOUNTER — Ambulatory Visit (INDEPENDENT_AMBULATORY_CARE_PROVIDER_SITE_OTHER): Payer: Medicare Other | Admitting: Family Medicine

## 2017-10-02 ENCOUNTER — Encounter: Payer: Self-pay | Admitting: Family Medicine

## 2017-10-02 ENCOUNTER — Other Ambulatory Visit: Payer: Self-pay

## 2017-10-02 VITALS — BP 140/78 | HR 67 | Temp 97.6°F | Ht 69.0 in | Wt 180.2 lb

## 2017-10-02 DIAGNOSIS — Z0000 Encounter for general adult medical examination without abnormal findings: Secondary | ICD-10-CM | POA: Diagnosis not present

## 2017-10-02 NOTE — Progress Notes (Signed)
Dr. Frederico Hamman T. Jomari Bartnik, MD, [facility] [place], [address] Phone: [phone] Fax: [phone]  10/02/2017  Patient: [name], MRN: [id number], DOB: 18-Nov-1940, 77 y.o.  Primary Physician:  Owens Loffler, MD   Chief Complaint  Patient presents with  . Medicare Wellness   Subjective:   [name] is a 77 y.o. pleasant patient who presents for a medicare wellness examination:  Preventative Health Maintenance Visit:  Health Maintenance Summary Reviewed and updated, unless pt declines services.  Tobacco History Reviewed. Alcohol: No concerns, no excessive use Exercise Habits: Some activity, rec at least 30 mins 5 times a week STD concerns: no risk or activity to increase risk Drug Use: None Encouraged self-testicular check   Patient has multiple medical problems including coronary disease, chronic kidney disease stage III, COPD, history of intraventricular hemorrhage, cardiomyopathy, renal artery stenosis, significant peripheral artery disease, abdominal aortic aneurysm.  He takes Coumadin chronically for A. fib.  He overall is doing reasonably well given his multiple medical problems, but he does get more fatigued than he used to, and this bothers him somewhat.  Health Maintenance  Topic Date Due  . TETANUS/TDAP  09/06/2026 (Originally 09/06/2016)  . INFLUENZA VACCINE  11/23/2017  . PNA vac Low Risk Adult  Completed    Immunization History  Administered Date(s) Administered  . Pneumococcal Conjugate-13 02/20/2014  . Pneumococcal Polysaccharide-23 04/11/2008  . Td 09/07/2006    Patient Active Problem List   Diagnosis Date Noted  . CKD (chronic kidney disease) stage 3, GFR 30-59 ml/min (HCC) 05/01/2013    Priority: High  . Intraventricular hemorrhage ([place]) 07/12/2012    Priority: High  . Ischemic cardiomyopathy 07/08/2011    Priority: High  . CAD (coronary artery disease), native coronary  artery 07/07/2011    Priority: High  . COPD (chronic obstructive pulmonary disease) ([place]) 01/08/2010    Priority: High  . Bilateral renal artery stenosis (HCC)     Priority: Medium  . HTN (hypertension) 07/15/2012    Priority: Medium  . Peripheral arterial disease ([place])     Priority: Medium  . Hypothyroidism 09/07/2006    Priority: Medium  . Long term (current) use of anticoagulants 01/27/2017  . DVT (deep venous thrombosis) ([place]) 12/29/2016  . Paroxysmal a-fib: briefly in ED now in NSR 05/24/2013  . Aortic calcification ([place]) 05/01/2013  . Subclavian arterial stenosis ([place]) 04/25/2011  . BPH (benign prostatic hyperplasia) 12/29/2010  . Abdominal aortic aneurysm ([place]) 11/06/2009  . HYPERCHOLESTEROLEMIA 10/09/2009  . TOBACCO ABUSE 04/11/2008  . PROSTATE SPECIFIC ANTIGEN, ELEVATED 04/11/2008   Past Medical History:  Diagnosis Date  . AAA (abdominal aortic aneurysm) ([place])   . Aortic calcification ([place]) 05/01/2013  . Arthritis    "hands; not dx'd" (12/26/2012)  . Bilateral renal artery stenosis ([place])    a. 06/2013 Angio/PTA: LRA: 95 (5x18 Herculink stent), RRA 60ost, celiac/SMA nl, RCIA patent stents extending into dist Ao, RIIA 70ost, REIA min irregs, LCIA patent stent, LIIA 60-70ost, LEIA min irregs.  . CHF (congestive heart failure) ([place])    Patient states he doesn't have CHF anymore per cardiologist.  . Coronary artery disease   . Hypertension   . Hypothyroidism   . Intraventricular hemorrhage ([place]) 07/12/2012  . Peripheral arterial disease ([place])    a. Previous left lower extremity stenting by Dr. Jamal Collin;  b. 12/2012 s/p bilat ostial common iliac stenting.  . Pneumonia   . Right leg DVT ([place]) 08/13/2012  .  Subclavian artery stenosis, left ([place]) 05/2012   Status post stenting of the ostium and self-expanding stent placement to the left axillary artery  . Tobacco abuse    d/c 2012  . Venous insufficiency    Past Surgical History:  Procedure Laterality Date  . ABDOMINAL  ANGIOGRAM  07/10/2013   WITH BI-FEMORAL RUNOFF       DR ARIDA  . ABDOMINAL AORTAGRAM N/A 12/26/2012   Procedure: ABDOMINAL Maxcine Ham;  Surgeon: Wellington Hampshire, MD;  Location: [facility];  Service: Cardiovascular;  Laterality: N/A;  . ABDOMINAL AORTAGRAM N/A 07/10/2013   Procedure: ABDOMINAL Maxcine Ham;  Surgeon: Wellington Hampshire, MD;  Location: [facility];  Service: Cardiovascular;  Laterality: N/A;  . ANGIOPLASTY / STENTING FEMORAL  05/2012  . ANGIOPLASTY / STENTING ILIAC Bilateral 12/26/2012  . ARCH AORTOGRAM N/A 06/20/2012   Procedure: ARCH AORTOGRAM;  Surgeon: Wellington Hampshire, MD;  Location: [facility];  Service: Cardiovascular;  Laterality: N/A;  . CARDIAC CATHETERIZATION  2/14   [facility]  . CHOLECYSTECTOMY  1990's  . CORONARY ARTERY BYPASS GRAFT  07/11/2011   Procedure: CORONARY ARTERY BYPASS GRAFTING (CABG);  Surgeon: Grace Isaac, MD;  Location: [facility];  Service: Open Heart Surgery;  Laterality: N/A;  Times 3. On Pump. Using right greater saphenous vein and left internal mammary artery.   Marland Kitchen HERNIA REPAIR    . INSERTION OF ILIAC STENT Bilateral 12/26/2012   Procedure: INSERTION OF ILIAC STENT;  Surgeon: Wellington Hampshire, MD;  Location: [facility];  Service: Cardiovascular;  Laterality: Bilateral;  Bilateral Common Iliac Artery  . LEFT AND RIGHT HEART CATHETERIZATION WITH CORONARY ANGIOGRAM  07/07/2011   Procedure: LEFT AND RIGHT HEART CATHETERIZATION WITH CORONARY ANGIOGRAM;  Surgeon: Minna Merritts, MD;  Location: [facility];  Service: Cardiovascular;;  . LOWER EXTREMITY VENOGRAPHY Bilateral 12/30/2016   Procedure: Lower Extremity Venography;  Surgeon: Katha Cabal, MD;  Location: [facility];  Service: Cardiovascular;  Laterality: Bilateral;  . PERCUTANEOUS STENT INTERVENTION  06/20/2012   Procedure: PERCUTANEOUS STENT INTERVENTION;  Surgeon: Wellington Hampshire, MD;  Location: [facility];  Service: Cardiovascular;;  . RENAL ARTERY ANGIOPLASTY Left 07/10/2013   DR ARIDA  .  SUBCLAVIAN ARTERY STENT  05/2012   "2 stents" (12/26/2012)  . TOOTH EXTRACTION  Spring 2017   mulitple (6)  . VENA CAVA FILTER PLACEMENT  07/2012   Removable   Social History   Socioeconomic History  . Marital status: Married    Spouse name: Not on file  . Number of children: 2  . Years of education: Not on file  . Highest education level: Not on file  Occupational History  . Occupation: Retired    Fish farm manager: RETIRED    Comment: [employer]  Social Needs  . Financial resource strain: Not on file  . Food insecurity:    Worry: Not on file    Inability: Not on file  . Transportation needs:    Medical: Not on file    Non-medical: Not on file  Tobacco Use  . Smoking status: Former Smoker    Packs/day: 1.00    Years: 50.00    Pack years: 50.00    Types: Cigarettes    Last attempt to quit: 12/25/2010    Years since quitting: 6.7  . Smokeless tobacco: Never Used  Substance and Sexual Activity  . Alcohol use: No  . Drug use: No  . Sexual activity: Not Currently  Lifestyle  . Physical activity:  Days per week: Not on file    Minutes per session: Not on file  . Stress: Not on file  Relationships  . Social connections:    Talks on phone: Not on file    Gets together: Not on file    Attends religious service: Not on file    Active member of club or organization: Not on file    Attends meetings of clubs or organizations: Not on file    Relationship status: Not on file  . Intimate partner violence:    Fear of current or ex partner: Not on file    Emotionally abused: Not on file    Physically abused: Not on file    Forced sexual activity: Not on file  Other Topics Concern  . Not on file  Social History Narrative   Exercsiing 3 times a week.    Moderate diet control.    O living will, no HCPOA.   Family History  Problem Relation Age of Onset  . Alcohol abuse Father   . Cirrhosis Father   . Hypothyroidism Sister    Allergies  Allergen Reactions  . Plavix  [Clopidogrel Bisulfate]     Brain hemorrhage prev while on plavix and aspirin    Medication list has been reviewed and updated.   General: Denies fever, chills, sweats. No significant weight loss. Eyes: Denies blurring,significant itching ENT: Denies earache, sore throat, and hoarseness. Cardiovascular: Denies chest pains, palpitations, dyspnea on exertion Respiratory: Denies cough, dyspnea at rest,wheeezing Breast: no concerns about lumps GI: Denies nausea, vomiting, diarrhea, constipation, change in bowel habits, abdominal pain, melena, hematochezia GU: Denies penile discharge, ED, urinary flow / outflow problems. No STD concerns. Musculoskeletal: Denies back pain, joint pain Derm: Denies rash, itching Neuro: Denies  paresthesias, frequent falls, frequent headaches Psych: Denies depression, anxiety Endocrine: Denies cold intolerance, heat intolerance, polydipsia Heme: Denies enlarged lymph nodes Allergy: No hayfever  Objective:   BP 140/78   Pulse 67   Temp 97.6 F (36.4 C) (Oral)   Ht _0  (1.753 m)   Wt 180 lb 4 oz (81.8 kg)   BMI 26.62 kg/m   The patient completed a fall screen and PHQ-2 and PHQ-9 if necessary, which is documented in the EHR. The CMA/LPN/RN who assisted the patient verbally completed with them and documented results in [facility].   Hearing Screening   Method: Audiometry   125Hz 250Hz 500Hz 1000Hz 2000Hz 3000Hz 4000Hz 6000Hz 8000Hz  Right ear:   _1 Left ear:   _2 Vision Screening Comments: Wears Glasses-Eye Exam at [employer] 03/02/2017  GEN: well developed, well nourished, no acute distress Eyes: conjunctiva and lids normal, PERRLA, EOMI ENT: TM clear, nares clear, oral exam WNL Neck: supple, no lymphadenopathy, no thyromegaly, no JVD Pulm: clear to auscultation and percussion, respiratory effort normal CV: regular rate and rhythm, S1-S2, no murmur, rub or gallop, no bruits, peripheral pulses normal and  symmetric, no cyanosis, clubbing, edema or varicosities GI: soft, non-tender; no hepatosplenomegaly, masses; active bowel sounds all quadrants GU: no hernia, testicular mass, penile discharge Lymph: no cervical, axillary or inguinal adenopathy MSK: gait normal, muscle tone and strength WNL, no joint swelling, effusions, discoloration, crepitus  SKIN: clear, good turgor, color WNL, no rashes, lesions, or ulcerations Neuro: normal mental status, normal strength, sensation, and motion Psych: alert; oriented to person, place and time, normally interactive and not anxious or depressed in appearance.  All labs reviewed with patient.  Lipids:    Component Value Date/Time   CHOL 113 09/21/2017 0921   CHOL 125 01/31/2014 0756   TRIG 78.0 09/21/2017 0921   HDL 54.20 09/21/2017 0921   HDL 63 01/31/2014 0756   VLDL 15.6 09/21/2017 0921   CHOLHDL 2 09/21/2017 0921   CBC: CBC Latest Ref Rng & Units 09/21/2017 12/31/2016 12/30/2016  WBC 4.0 - 10.5 K/uL 5.3 9.5 9.5  Hemoglobin 13.0 - 17.0 g/dL 16.0 12.4(L) 13.4  Hematocrit 39.0 - 52.0 % 48.5 36.1(L) 38.9(L)  Platelets 150.0 - 400.0 K/uL 195.0 154 998    Basic Metabolic Panel:    Component Value Date/Time   NA 139 09/21/2017 0921   NA 140 07/28/2015 1006   NA 136 06/03/2013 0454   K 5.2 (H) 09/21/2017 0921   K 4.1 06/03/2013 0454   CL 105 09/21/2017 0921   CL 106 06/03/2013 0454   CO2 28 09/21/2017 0921   CO2 27 06/03/2013 0454   BUN 22 09/21/2017 0921   BUN 18 07/28/2015 1006   BUN 22 (H) 06/03/2013 0454   CREATININE 1.47 09/21/2017 0921   CREATININE 1.59 (H) 06/03/2013 0454   CREATININE 1.02 04/22/2011 1507   GLUCOSE 106 (H) 09/21/2017 0921   GLUCOSE 80 06/03/2013 0454   CALCIUM 9.2 09/21/2017 0921   CALCIUM 8.8 06/03/2013 0454   Hepatic Function Latest Ref Rng & Units 09/21/2017 12/29/2016 12/24/2016  Total Protein 6.0 - 8.3 g/dL 6.5 7.8 7.3  Albumin 3.5 - 5.2 g/dL 4.2 4.0 4.3  AST 0 - 37 U/L _0 ALT 0 - 53 U/L 8 12(L) 10(L)    Alk Phosphatase 39 - 117 U/L 74 82 83  Total Bilirubin 0.2 - 1.2 mg/dL 0.6 1.5(H) 0.8  Bilirubin, Direct 0.0 - 0.3 mg/dL 0.2 - -    Lab Results  Component Value Date   TSH 1.23 09/21/2017   Lab Results  Component Value Date   PSA 5.09 (H) 09/21/2017   PSA 5.90 (H) 12/01/2016   PSA 3.32 08/01/2014    Assessment and Plan:   Healthcare maintenance   PSA normal for age adjusted  I actually think he is doing quite well.  He is 52 and is almost died multiple times and has many different medical comorbidities.  I think that it is fairly reasonable to except a level of fatigue with everything going on.  I encouraged him to keep trying to be active and get outside when he can.  He tries to do this and do some yard work and then take rest when he needs it.  Health Maintenance Exam: The patient's preventative maintenance and recommended screening tests for an annual wellness exam were reviewed in full today. Brought up to date unless services declined.  Counselled on the importance of diet, exercise, and its role in overall health and mortality. The patient's FH and SH was reviewed, including their home life, tobacco status, and drug and alcohol status.  Follow-up in 1 year for physical exam or additional follow-up below.  I have personally reviewed the Medicare Annual Wellness questionnaire and have noted 1. The patient's medical and social history 2. Their use of alcohol, tobacco or illicit drugs 3. Their current medications and supplements 4. The patient's functional ability including ADL's, fall risks, home safety risks and hearing or visual             impairment. 5. Diet and physical activities 6. Evidence for depression or mood disorders 7.  Reviewed Updated provider list, see scanned forms and [facility] Snapshot.   The patients weight, height, BMI and visual acuity have been recorded in the chart I have made referrals, counseling and provided education to the patient based review  of the above and I have provided the pt with a written personalized care plan for preventive services.  I have provided the patient with a copy of your personalized plan for preventive services. Instructed to take the time to review along with their updated medication list.  Follow-up: No follow-ups on file. Or follow-up in 1 year if not noted.   Signed,  Maud Deed. Xandra Laramee, MD   Allergies as of 10/02/2017      Reactions   Plavix [clopidogrel Bisulfate]    Brain hemorrhage prev while on plavix and aspirin      Medication List        Accurate as of 10/02/17 10:41 AM. Always use your most recent med list.          amLODipine 5 MG tablet Commonly known as:  NORVASC TAKE ONE TABLET BY MOUTH EVERY DAY   atorvastatin 20 MG tablet Commonly known as:  LIPITOR TAKE 1 TABLET BY MOUTH DAILY AT 6PM   carvedilol 6.25 MG tablet Commonly known as:  COREG TAKE 1 TABLET TWICE DAILY WITH MEALS   levothyroxine 150 MCG tablet Commonly known as:  SYNTHROID, LEVOTHROID Take 150 mcg by mouth daily before breakfast.   traMADol 50 MG tablet Commonly known as:  ULTRAM Take 1 tablet (50 mg total) by mouth every 6 (six) hours as needed for moderate pain.   warfarin 5 MG tablet Commonly known as:  COUMADIN TAKE ONE TABLET BY MOUTH EVERY DAY OR ASDIRECTED BY COUMADIN CLINIC

## 2017-10-04 ENCOUNTER — Other Ambulatory Visit: Payer: Self-pay | Admitting: Family Medicine

## 2017-10-19 ENCOUNTER — Ambulatory Visit: Payer: Medicare Other | Admitting: General Practice

## 2017-10-19 DIAGNOSIS — Z7901 Long term (current) use of anticoagulants: Secondary | ICD-10-CM

## 2017-10-19 LAB — POCT INR: INR: 1.5 — AB (ref 2.0–3.0)

## 2017-10-19 NOTE — Patient Instructions (Signed)
Pre visit review using our clinic review tool, if applicable. No additional management support is needed unless otherwise documented below in the visit note.  Take 1 tablet today (6/27) and take 1 1/2 tablets tomorrow (6/28) and then resume taking 1 tablet daily except 1/2 tablet on Sunday and Thursdays.  Re-check in 3 to 4 weeks.

## 2017-11-03 ENCOUNTER — Other Ambulatory Visit: Payer: Self-pay

## 2017-11-03 MED ORDER — ATORVASTATIN CALCIUM 20 MG PO TABS: ORAL_TABLET | ORAL | 3 refills | Status: DC

## 2017-11-03 NOTE — Telephone Encounter (Signed)
*  STAT* If patient is at the pharmacy, call can be transferred to refill team.   1. Which medications need to be refilled? (please list name of each medication and dose if known) Atorvastatin 20 mg  2. Which pharmacy/location (including street and city if local pharmacy) is medication to be sent to? [facility]  3. Do they need a 30 day or 90 day supply? [place]

## 2017-11-16 ENCOUNTER — Ambulatory Visit (INDEPENDENT_AMBULATORY_CARE_PROVIDER_SITE_OTHER): Payer: Medicare Other | Admitting: General Practice

## 2017-11-16 DIAGNOSIS — Z7901 Long term (current) use of anticoagulants: Secondary | ICD-10-CM | POA: Diagnosis not present

## 2017-11-16 LAB — POCT INR: INR: 2.3 (ref 2.0–3.0)

## 2017-11-16 NOTE — Patient Instructions (Addendum)
Pre visit review using our clinic review tool, if applicable. No additional management support is needed unless otherwise documented below in the visit note.  Continue to take 1 tablet daily except 1/2 tablet on Sunday and Thursdays.  Re-check in 4 weeks.

## 2017-11-22 ENCOUNTER — Other Ambulatory Visit: Payer: Self-pay | Admitting: Family Medicine

## 2017-11-22 NOTE — Telephone Encounter (Signed)
Patient is compliant with coumadin management, will refill X 6 months.   

## 2017-11-27 ENCOUNTER — Other Ambulatory Visit: Payer: Self-pay

## 2017-11-30 ENCOUNTER — Other Ambulatory Visit: Payer: Self-pay | Admitting: Family Medicine

## 2017-12-14 ENCOUNTER — Ambulatory Visit (INDEPENDENT_AMBULATORY_CARE_PROVIDER_SITE_OTHER): Payer: Medicare Other | Admitting: General Practice

## 2017-12-14 DIAGNOSIS — Z7901 Long term (current) use of anticoagulants: Secondary | ICD-10-CM | POA: Diagnosis not present

## 2017-12-14 LAB — POCT INR: INR: 2.1 (ref 2.0–3.0)

## 2017-12-14 NOTE — Patient Instructions (Addendum)
Pre visit review using our clinic review tool, if applicable. No additional management support is needed unless otherwise documented below in the visit note.  Continue to take 1 tablet daily except 1/2 tablet on Sunday and Thursdays.  Re-check in 4 weeks.

## 2017-12-21 ENCOUNTER — Other Ambulatory Visit: Payer: Self-pay | Admitting: Family Medicine

## 2018-01-03 ENCOUNTER — Other Ambulatory Visit: Payer: Self-pay | Admitting: Family Medicine

## 2018-01-11 ENCOUNTER — Ambulatory Visit (INDEPENDENT_AMBULATORY_CARE_PROVIDER_SITE_OTHER): Payer: Medicare Other | Admitting: General Practice

## 2018-01-11 DIAGNOSIS — Z7901 Long term (current) use of anticoagulants: Secondary | ICD-10-CM | POA: Diagnosis not present

## 2018-01-11 LAB — POCT INR: INR: 2.1 (ref 2.0–3.0)

## 2018-01-11 NOTE — Patient Instructions (Addendum)
Pre visit review using our clinic review tool, if applicable. No additional management support is needed unless otherwise documented below in the visit note.  Continue to take 1 tablet daily except 1/2 tablet on Sunday and Thursdays.  Re-check in 6 weeks.

## 2018-02-20 ENCOUNTER — Ambulatory Visit (INDEPENDENT_AMBULATORY_CARE_PROVIDER_SITE_OTHER): Payer: Medicare Other

## 2018-02-20 DIAGNOSIS — I824Z9 Acute embolism and thrombosis of unspecified deep veins of unspecified distal lower extremity: Secondary | ICD-10-CM

## 2018-02-20 LAB — POCT INR: INR: 2.6 (ref 2.0–3.0)

## 2018-02-20 NOTE — Patient Instructions (Signed)
INR today 2.6  Continue to take 1 tablet daily except 1/2 tablet on Sunday and Thursdays.  Re-check in 6 weeks.  Patient is doing well without any changes to diet, health or medications.  He is eating healthier,  Cutting back on sugar and starches, has last 15 lbs in recent months.

## 2018-02-28 ENCOUNTER — Other Ambulatory Visit: Payer: Self-pay | Admitting: Cardiovascular Disease

## 2018-03-26 ENCOUNTER — Other Ambulatory Visit: Payer: Self-pay | Admitting: Family Medicine

## 2018-03-26 NOTE — Telephone Encounter (Signed)
Patient is compliant with coumadin management, will refill X 6.

## 2018-04-05 ENCOUNTER — Ambulatory Visit (INDEPENDENT_AMBULATORY_CARE_PROVIDER_SITE_OTHER): Payer: Medicare Other | Admitting: General Practice

## 2018-04-05 DIAGNOSIS — Z7901 Long term (current) use of anticoagulants: Secondary | ICD-10-CM

## 2018-04-05 LAB — POCT INR: INR: 4.3 — AB (ref 2.0–3.0)

## 2018-04-05 NOTE — Patient Instructions (Addendum)
Pre visit review using our clinic review tool, if applicable. No additional management support is needed unless otherwise documented below in the visit note.  Hold coumadin today and tomorrow (12/12 and 12/13) and then continue to take 1 tablet daily except 1/2 tablet on Sunday and Thursdays.  Re-check in 3 to 4 weeks.

## 2018-05-03 ENCOUNTER — Ambulatory Visit (INDEPENDENT_AMBULATORY_CARE_PROVIDER_SITE_OTHER): Payer: Medicare Other | Admitting: General Practice

## 2018-05-03 DIAGNOSIS — Z7901 Long term (current) use of anticoagulants: Secondary | ICD-10-CM

## 2018-05-03 LAB — POCT INR: INR: 3.2 — AB (ref 2.0–3.0)

## 2018-05-03 NOTE — Patient Instructions (Addendum)
Pre visit review using our clinic review tool, if applicable. No additional management support is needed unless otherwise documented below in the visit note.  Hold coumadin today and then change dosage and take 1 tablet daily except 1/2 tablet on Sunday/Tuesdays and Thursdays.  Re-check in 4 weeks.

## 2018-05-26 ENCOUNTER — Other Ambulatory Visit: Payer: Self-pay | Admitting: Family Medicine

## 2018-05-31 ENCOUNTER — Ambulatory Visit (INDEPENDENT_AMBULATORY_CARE_PROVIDER_SITE_OTHER): Payer: Medicare Other | Admitting: General Practice

## 2018-05-31 DIAGNOSIS — Z7901 Long term (current) use of anticoagulants: Secondary | ICD-10-CM

## 2018-05-31 LAB — POCT INR: INR: 2.2 (ref 2.0–3.0)

## 2018-05-31 NOTE — Patient Instructions (Addendum)
Pre visit review using our clinic review tool, if applicable. No additional management support is needed unless otherwise documented below in the visit note.  Continue to take 1 tablet daily except 1/2 tablet on Sunday/Tuesdays and Thursdays.  Re-check in 4 weeks 

## 2018-06-28 ENCOUNTER — Ambulatory Visit (INDEPENDENT_AMBULATORY_CARE_PROVIDER_SITE_OTHER): Payer: Medicare Other | Admitting: General Practice

## 2018-06-28 DIAGNOSIS — I480 Paroxysmal atrial fibrillation: Secondary | ICD-10-CM

## 2018-06-28 DIAGNOSIS — Z7901 Long term (current) use of anticoagulants: Secondary | ICD-10-CM

## 2018-06-28 LAB — POCT INR: INR: 1.8 — AB (ref 2.0–3.0)

## 2018-06-28 NOTE — Patient Instructions (Addendum)
Pre visit review using our clinic review tool, if applicable. No additional management support is needed unless otherwise documented below in the visit note.  Take 1 tablet today (3/5) and then continue to take 1 tablet daily except 1/2 tablet on Sunday/Tuesdays and Thursdays.  Re-check in 4 weeks.

## 2018-07-26 ENCOUNTER — Ambulatory Visit: Payer: Medicare Other

## 2018-07-26 ENCOUNTER — Ambulatory Visit (INDEPENDENT_AMBULATORY_CARE_PROVIDER_SITE_OTHER): Payer: Medicare Other | Admitting: General Practice

## 2018-07-26 ENCOUNTER — Other Ambulatory Visit: Payer: Self-pay

## 2018-07-26 ENCOUNTER — Other Ambulatory Visit (INDEPENDENT_AMBULATORY_CARE_PROVIDER_SITE_OTHER): Payer: Medicare Other

## 2018-07-26 DIAGNOSIS — I82409 Acute embolism and thrombosis of unspecified deep veins of unspecified lower extremity: Secondary | ICD-10-CM | POA: Diagnosis not present

## 2018-07-26 DIAGNOSIS — Z7901 Long term (current) use of anticoagulants: Secondary | ICD-10-CM

## 2018-07-26 LAB — POCT INR: INR: 2.6 (ref 2.0–3.0)

## 2018-07-26 NOTE — Patient Instructions (Signed)
Pre visit review using our clinic review tool, if applicable. No additional management support is needed unless otherwise documented below in the visit note.  Continue to take 1 tablet daily except 1/2 tablet on Sunday/Tuesdays and Thursdays.  Re-check in 4 weeks.

## 2018-08-23 ENCOUNTER — Other Ambulatory Visit (INDEPENDENT_AMBULATORY_CARE_PROVIDER_SITE_OTHER): Payer: Medicare Other

## 2018-08-23 ENCOUNTER — Other Ambulatory Visit: Payer: Self-pay

## 2018-08-23 ENCOUNTER — Ambulatory Visit (INDEPENDENT_AMBULATORY_CARE_PROVIDER_SITE_OTHER): Payer: Medicare Other | Admitting: General Practice

## 2018-08-23 DIAGNOSIS — Z7901 Long term (current) use of anticoagulants: Secondary | ICD-10-CM | POA: Diagnosis not present

## 2018-08-23 DIAGNOSIS — I82409 Acute embolism and thrombosis of unspecified deep veins of unspecified lower extremity: Secondary | ICD-10-CM

## 2018-08-23 LAB — POCT INR: INR: 1.7 — AB (ref 2.0–3.0)

## 2018-08-23 NOTE — Patient Instructions (Signed)
Pre visit review using our clinic review tool, if applicable. No additional management support is needed unless otherwise documented below in the visit note.  Take 1 tablet today and then continue to take 1 tablet daily except 1/2 tablet on Sunday/Tuesdays and Thursdays.  Re-check in 4 weeks 5/23 @ 9:30.

## 2018-09-16 ENCOUNTER — Telehealth: Payer: Self-pay | Admitting: Family Medicine

## 2018-09-18 NOTE — Telephone Encounter (Signed)
Please schedule MWV with Katha Cabal and CPE with Dr. [name] sometime in the next 3 months.

## 2018-09-19 NOTE — Telephone Encounter (Signed)
Medicare wellness 9/16 cpx dr copland 9/21 Pt aware

## 2018-09-20 ENCOUNTER — Other Ambulatory Visit (INDEPENDENT_AMBULATORY_CARE_PROVIDER_SITE_OTHER): Payer: Medicare Other

## 2018-09-20 ENCOUNTER — Ambulatory Visit (INDEPENDENT_AMBULATORY_CARE_PROVIDER_SITE_OTHER): Payer: Medicare Other | Admitting: General Practice

## 2018-09-20 DIAGNOSIS — I480 Paroxysmal atrial fibrillation: Secondary | ICD-10-CM

## 2018-09-20 DIAGNOSIS — Z7901 Long term (current) use of anticoagulants: Secondary | ICD-10-CM

## 2018-09-20 DIAGNOSIS — I82409 Acute embolism and thrombosis of unspecified deep veins of unspecified lower extremity: Secondary | ICD-10-CM | POA: Diagnosis not present

## 2018-09-20 DIAGNOSIS — I824Z9 Acute embolism and thrombosis of unspecified deep veins of unspecified distal lower extremity: Secondary | ICD-10-CM | POA: Diagnosis not present

## 2018-09-20 LAB — POCT INR: INR: 2 (ref 2.0–3.0)

## 2018-09-20 NOTE — Patient Instructions (Signed)
Pre visit review using our clinic review tool, if applicable. No additional management support is needed unless otherwise documented below in the visit note.  Continue to take 1 tablet daily except 1/2 tablet on Sunday/Tuesdays and Thursdays.  Re-check in 4 weeks.

## 2018-09-24 ENCOUNTER — Telehealth: Payer: Self-pay

## 2018-09-24 NOTE — Telephone Encounter (Signed)
Spoke with patient to schedule overdue F/U with Dr. Fletcher Anon. Offered tele-health visit due to current clinic policies related to COVID 19 precautions. Patient declined stating he prefers a face to face visit. Advised patient that a recall will be placed for 11/24/2018 and that he may also call our office at that time to reschedule.

## 2018-09-25 ENCOUNTER — Other Ambulatory Visit: Payer: Self-pay | Admitting: Family Medicine

## 2018-09-25 NOTE — Telephone Encounter (Signed)
Patient is compliant with coumadin management will refill x 6 months.

## 2018-10-17 ENCOUNTER — Ambulatory Visit (INDEPENDENT_AMBULATORY_CARE_PROVIDER_SITE_OTHER): Payer: Self-pay | Admitting: General Practice

## 2018-10-17 ENCOUNTER — Other Ambulatory Visit (INDEPENDENT_AMBULATORY_CARE_PROVIDER_SITE_OTHER): Payer: Medicare Other

## 2018-10-17 DIAGNOSIS — I82403 Acute embolism and thrombosis of unspecified deep veins of lower extremity, bilateral: Secondary | ICD-10-CM

## 2018-10-17 DIAGNOSIS — Z7901 Long term (current) use of anticoagulants: Secondary | ICD-10-CM

## 2018-10-17 DIAGNOSIS — I82409 Acute embolism and thrombosis of unspecified deep veins of unspecified lower extremity: Secondary | ICD-10-CM

## 2018-10-17 DIAGNOSIS — O2230 Deep phlebothrombosis in pregnancy, unspecified trimester: Secondary | ICD-10-CM

## 2018-10-17 LAB — POCT INR: INR: 2.4 (ref 2.0–3.0)

## 2018-10-17 NOTE — Patient Instructions (Addendum)
Pre visit review using our clinic review tool, if applicable. No additional management support is needed unless otherwise documented below in the visit note.  Continue to take 1 tablet daily except 1/2 tablet on Sunday/Tuesdays and Thursdays.  Re-check in 4 weeks

## 2018-10-18 ENCOUNTER — Other Ambulatory Visit: Payer: Medicare Other

## 2018-10-19 NOTE — Progress Notes (Signed)
Done

## 2018-11-15 ENCOUNTER — Ambulatory Visit (INDEPENDENT_AMBULATORY_CARE_PROVIDER_SITE_OTHER): Payer: Medicare Other | Admitting: General Practice

## 2018-11-15 ENCOUNTER — Ambulatory Visit: Payer: Medicare Other

## 2018-11-15 DIAGNOSIS — Z7901 Long term (current) use of anticoagulants: Secondary | ICD-10-CM | POA: Diagnosis not present

## 2018-11-15 LAB — POCT INR: INR: 2.3 (ref 2.0–3.0)

## 2018-11-15 NOTE — Patient Instructions (Addendum)
Pre visit review using our clinic review tool, if applicable. No additional management support is needed unless otherwise documented below in the visit note.  Continue to take 1 tablet daily except 1/2 tablet on Sunday/Tuesdays and Thursdays.  Re-check in 5 weeks.

## 2018-11-21 ENCOUNTER — Other Ambulatory Visit: Payer: Self-pay | Admitting: Family Medicine

## 2018-11-22 ENCOUNTER — Other Ambulatory Visit: Payer: Self-pay | Admitting: Family Medicine

## 2018-12-10 ENCOUNTER — Other Ambulatory Visit: Payer: Self-pay | Admitting: Family Medicine

## 2018-12-20 ENCOUNTER — Ambulatory Visit (INDEPENDENT_AMBULATORY_CARE_PROVIDER_SITE_OTHER): Payer: Medicare Other | Admitting: General Practice

## 2018-12-20 DIAGNOSIS — Z7901 Long term (current) use of anticoagulants: Secondary | ICD-10-CM

## 2018-12-20 LAB — POCT INR: INR: 2.3 (ref 2.0–3.0)

## 2018-12-20 NOTE — Patient Instructions (Addendum)
Pre visit review using our clinic review tool, if applicable. No additional management support is needed unless otherwise documented below in the visit note.  Continue to take 1 tablet daily except 1/2 tablet on Sunday/Tuesdays and Thursdays.  Re-check in 6 weeks.    

## 2019-01-09 ENCOUNTER — Ambulatory Visit: Payer: Medicare Other

## 2019-01-09 ENCOUNTER — Ambulatory Visit (INDEPENDENT_AMBULATORY_CARE_PROVIDER_SITE_OTHER): Payer: Medicare Other

## 2019-01-09 VITALS — Ht 69.0 in | Wt 170.0 lb

## 2019-01-09 DIAGNOSIS — Z0000 Encounter for general adult medical examination without abnormal findings: Secondary | ICD-10-CM | POA: Diagnosis not present

## 2019-01-09 NOTE — Patient Instructions (Signed)
[name] , Thank you for taking time to come for your Medicare Wellness Visit. I appreciate your ongoing commitment to your health goals. Please review the following plan we discussed and let me know if I can assist you in the future.   Screening recommendations/referrals: Colonoscopy: no longer required Recommended yearly ophthalmology/optometry visit for glaucoma screening and checkup Recommended yearly dental visit for hygiene and checkup  Vaccinations: Influenza vaccine: declined Pneumococcal vaccine: completed series Tdap vaccine: declined Shingles vaccine: declined    Advanced directives: Please bring a copy of your POA (Power of Attorney) and/or Living Will to your next appointment once you complete the paperwork.   Conditions/risks identified: hypertension  Next appointment: 01/14/2019 @ 2:20 pm   Preventive Care 65 Years and Older, Male Preventive care refers to lifestyle choices and visits with your health care provider that can promote health and wellness. What does preventive care include?  A yearly physical exam. This is also called an annual well check.  Dental exams once or twice a year.  Routine eye exams. Ask your health care provider how often you should have your eyes checked.  Personal lifestyle choices, including:  Daily care of your teeth and gums.  Regular physical activity.  Eating a healthy diet.  Avoiding tobacco and drug use.  Limiting alcohol use.  Practicing safe sex.  Taking low doses of aspirin every day.  Taking vitamin and mineral supplements as recommended by your health care provider. What happens during an annual well check? The services and screenings done by your health care provider during your annual well check will depend on your age, overall health, lifestyle risk factors, and family history of disease. Counseling  Your health care provider may ask you questions about your:  Alcohol use.  Tobacco use.  Drug use.   Emotional well-being.  Home and relationship well-being.  Sexual activity.  Eating habits.  History of falls.  Memory and ability to understand (cognition).  Work and work Statistician. Screening  You may have the following tests or measurements:  Height, weight, and BMI.  Blood pressure.  Lipid and cholesterol levels. These may be checked every 5 years, or more frequently if you are over 67 years old.  Skin check.  Lung cancer screening. You may have this screening every year starting at age 13 if you have a 30-pack-year history of smoking and currently smoke or have quit within the past 15 years.  Fecal occult blood test (FOBT) of the stool. You may have this test every year starting at age 29.  Flexible sigmoidoscopy or colonoscopy. You may have a sigmoidoscopy every 5 years or a colonoscopy every 10 years starting at age 20.  Prostate cancer screening. Recommendations will vary depending on your family history and other risks.  Hepatitis C blood test.  Hepatitis B blood test.  Sexually transmitted disease (STD) testing.  Diabetes screening. This is done by checking your blood sugar (glucose) after you have not eaten for a while (fasting). You may have this done every 1-3 years.  Abdominal aortic aneurysm (AAA) screening. You may need this if you are a current or former smoker.  Osteoporosis. You may be screened starting at age 61 if you are at high risk. Talk with your health care provider about your test results, treatment options, and if necessary, the need for more tests. Vaccines  Your health care provider may recommend certain vaccines, such as:  Influenza vaccine. This is recommended every year.  Tetanus, diphtheria, and acellular pertussis (Tdap,  Td) vaccine. You may need a Td booster every 10 years.  Zoster vaccine. You may need this after age 15.  Pneumococcal 13-valent conjugate (PCV13) vaccine. One dose is recommended after age 77.  Pneumococcal  polysaccharide (PPSV23) vaccine. One dose is recommended after age 77. Talk to your health care provider about which screenings and vaccines you need and how often you need them. This information is not intended to replace advice given to you by your health care provider. Make sure you discuss any questions you have with your health care provider. Document Released: 05/08/2015 Document Revised: 12/30/2015 Document Reviewed: 02/10/2015 Elsevier Interactive Patient Education  2017 [employer] Prevention in the Home Falls can cause injuries. They can happen to people of all ages. There are many things you can do to make your home safe and to help prevent falls. What can I do on the outside of my home?  Regularly fix the edges of walkways and driveways and fix any cracks.  Remove anything that might make you trip as you walk through a door, such as a raised step or threshold.  Trim any bushes or trees on the path to your home.  Use bright outdoor lighting.  Clear any walking paths of anything that might make someone trip, such as rocks or tools.  Regularly check to see if handrails are loose or broken. Make sure that both sides of any steps have handrails.  Any raised decks and porches should have guardrails on the edges.  Have any leaves, snow, or ice cleared regularly.  Use sand or salt on walking paths during winter.  Clean up any spills in your garage right away. This includes oil or grease spills. What can I do in the bathroom?  Use night lights.  Install grab bars by the toilet and in the tub and shower. Do not use towel bars as grab bars.  Use non-skid mats or decals in the tub or shower.  If you need to sit down in the shower, use a plastic, non-slip stool.  Keep the floor dry. Clean up any water that spills on the floor as soon as it happens.  Remove soap buildup in the tub or shower regularly.  Attach bath mats securely with double-sided non-slip rug tape.   Do not have throw rugs and other things on the floor that can make you trip. What can I do in the bedroom?  Use night lights.  Make sure that you have a light by your bed that is easy to reach.  Do not use any sheets or blankets that are too big for your bed. They should not hang down onto the floor.  Have a firm chair that has side arms. You can use this for support while you get dressed.  Do not have throw rugs and other things on the floor that can make you trip. What can I do in the kitchen?  Clean up any spills right away.  Avoid walking on wet floors.  Keep items that you use a lot in easy-to-reach places.  If you need to reach something above you, use a strong step stool that has a grab bar.  Keep electrical cords out of the way.  Do not use floor polish or wax that makes floors slippery. If you must use wax, use non-skid floor wax.  Do not have throw rugs and other things on the floor that can make you trip. What can I do with my stairs?  Do not  leave any items on the stairs.  Make sure that there are handrails on both sides of the stairs and use them. Fix handrails that are broken or loose. Make sure that handrails are as long as the stairways.  Check any carpeting to make sure that it is firmly attached to the stairs. Fix any carpet that is loose or worn.  Avoid having throw rugs at the top or bottom of the stairs. If you do have throw rugs, attach them to the floor with carpet tape.  Make sure that you have a light switch at the top of the stairs and the bottom of the stairs. If you do not have them, ask someone to add them for you. What else can I do to help prevent falls?  Wear shoes that:  Do not have high heels.  Have rubber bottoms.  Are comfortable and fit you well.  Are closed at the toe. Do not wear sandals.  If you use a stepladder:  Make sure that it is fully opened. Do not climb a closed stepladder.  Make sure that both sides of the  stepladder are locked into place.  Ask someone to hold it for you, if possible.  Clearly mark and make sure that you can see:  Any grab bars or handrails.  First and last steps.  Where the edge of each step is.  Use tools that help you move around (mobility aids) if they are needed. These include:  Canes.  Walkers.  Scooters.  Crutches.  Turn on the lights when you go into a dark area. Replace any light bulbs as soon as they burn out.  Set up your furniture so you have a clear path. Avoid moving your furniture around.  If any of your floors are uneven, fix them.  If there are any pets around you, be aware of where they are.  Review your medicines with your doctor. Some medicines can make you feel dizzy. This can increase your chance of falling. Ask your doctor what other things that you can do to help prevent falls. This information is not intended to replace advice given to you by your health care provider. Make sure you discuss any questions you have with your health care provider. Document Released: 02/05/2009 Document Revised: 09/17/2015 Document Reviewed: 05/16/2014 Elsevier Interactive Patient Education  2017 [employer].

## 2019-01-09 NOTE — Progress Notes (Signed)
PCP notes: none  Health Maintenance: declined flu vaccine and Tdap vaccine    Abnormal Screenings: none    Patient concerns: none    Nurse concerns: none    Next PCP appt.: 01/14/2019 @ 2:20 pm

## 2019-01-09 NOTE — Progress Notes (Signed)
Subjective:   [name] is a 78 y.o. male who presents for Medicare Annual/Subsequent preventive examination.  Review of Systems:    This visit is being conducted through telemedicine due to the COVID-19 pandemic. This patient has given me verbal consent via doximity to conduct this visit, patient states they are participating from their home address. Some vital signs may be absent or patient reported.    Patient identification: identified by name, DOB, and current address  Cardiac Risk Factors include: advanced age (>12men, >32 women);hypertension;male gender     Objective:    Vitals: Ht 5\' 9"  (1.753 m)   Wt 170 lb (77.1 kg)   BMI 25.10 kg/m   Body mass index is 25.1 kg/m.  Advanced Directives 01/09/2019 01/19/2017 12/29/2016 12/29/2016 12/24/2016 12/23/2016 12/22/2016  Does Patient Have a Medical Advance Directive? No No No No No No No  Type of Advance Directive - - - - - - -  Copy of Healthcare Power of Attorney in Chart? - - - - - - -  Would patient like information on creating a medical advance directive? No - Guardian declined - No - Patient declined No - Patient declined No - Patient declined - -  Pre-existing out of facility DNR order (yellow form or pink MOST form) - - - - - - -    Tobacco Social History   Tobacco Use  Smoking Status Former Smoker  . Packs/day: 1.00  . Years: 50.00  . Pack years: 50.00  . Types: Cigarettes  . Quit date: 12/25/2010  . Years since quitting: 8.0  Smokeless Tobacco Never Used     Counseling given: Not Answered   Clinical Intake:  Pre-visit preparation completed: Yes  Pain : No/denies pain     Nutritional Risks: None Diabetes: No  How often do you need to have someone help you when you read instructions, pamphlets, or other written materials from your doctor or pharmacy?: 1 - Never What is the last grade level you completed in school?: 2 years of college  Interpreter Needed?: No  Information entered by :: CJohnson, LPN   Past Medical History:  Diagnosis Date  . AAA (abdominal aortic aneurysm) ([place])   . Aortic calcification ([place]) 05/01/2013  . Arthritis    "hands; not dx'd" (12/26/2012)  . Bilateral renal artery stenosis ([place])    a. 06/2013 Angio/PTA: LRA: 95 (5x18 Herculink stent), RRA 60ost, celiac/SMA nl, RCIA patent stents extending into dist Ao, RIIA 70ost, REIA min irregs, LCIA patent stent, LIIA 60-70ost, LEIA min irregs.  . CHF (congestive heart failure) ([place])    Patient states he doesn't have CHF anymore per cardiologist.  . Coronary artery disease   . Hypertension   . Hypothyroidism   . Intraventricular hemorrhage ([place]) 07/12/2012  . Peripheral arterial disease ([place])    a. Previous left lower extremity stenting by Dr. Jamal Collin;  b. 12/2012 s/p bilat ostial common iliac stenting.  . Pneumonia   . Right leg DVT ([place]) 08/13/2012  . Subclavian artery stenosis, left ([place]) 05/2012   Status post stenting of the ostium and self-expanding stent placement to the left axillary artery  . Tobacco abuse    d/c 2012  . Venous insufficiency    Past Surgical History:  Procedure Laterality Date  . ABDOMINAL ANGIOGRAM  07/10/2013   WITH BI-FEMORAL RUNOFF       DR ARIDA  . ABDOMINAL AORTAGRAM N/A 12/26/2012   Procedure: ABDOMINAL Maxcine Ham;  Surgeon: Wellington Hampshire, MD;  [facility] CATH  LAB;  Service: Cardiovascular;  Laterality: N/A;  . ABDOMINAL AORTAGRAM N/A 07/10/2013   Procedure: ABDOMINAL Maxcine Ham;  Surgeon: Wellington Hampshire, MD;  Location: [facility] CATH LAB;  Service: Cardiovascular;  Laterality: N/A;  . ANGIOPLASTY / STENTING FEMORAL  05/2012  . ANGIOPLASTY / STENTING ILIAC Bilateral 12/26/2012  . ARCH AORTOGRAM N/A 06/20/2012   Procedure: ARCH AORTOGRAM;  Surgeon: Wellington Hampshire, MD;  Location: [facility] CATH LAB;  Service: Cardiovascular;  Laterality: N/A;  . CARDIAC CATHETERIZATION  2/14   [facility]  . CHOLECYSTECTOMY  1990's  . CORONARY ARTERY BYPASS GRAFT  07/11/2011   Procedure: CORONARY ARTERY BYPASS GRAFTING (CABG);   Surgeon: Grace Isaac, MD;  Location: [facility];  Service: Open Heart Surgery;  Laterality: N/A;  Times 3. On Pump. Using right greater saphenous vein and left internal mammary artery.   Marland Kitchen HERNIA REPAIR    . INSERTION OF ILIAC STENT Bilateral 12/26/2012   Procedure: INSERTION OF ILIAC STENT;  Surgeon: Wellington Hampshire, MD;  Location: [facility] CATH LAB;  Service: Cardiovascular;  Laterality: Bilateral;  Bilateral Common Iliac Artery  . LEFT AND RIGHT HEART CATHETERIZATION WITH CORONARY ANGIOGRAM  07/07/2011   Procedure: LEFT AND RIGHT HEART CATHETERIZATION WITH CORONARY ANGIOGRAM;  Surgeon: Minna Merritts, MD;  Location: [facility] CATH LAB;  Service: Cardiovascular;;  . LOWER EXTREMITY VENOGRAPHY Bilateral 12/30/2016   Procedure: Lower Extremity Venography;  Surgeon: Katha Cabal, MD;  Location: [facility] CV LAB;  Service: Cardiovascular;  Laterality: Bilateral;  . PERCUTANEOUS STENT INTERVENTION  06/20/2012   Procedure: PERCUTANEOUS STENT INTERVENTION;  Surgeon: Wellington Hampshire, MD;  Location: [facility] CATH LAB;  Service: Cardiovascular;;  . RENAL ARTERY ANGIOPLASTY Left 07/10/2013   DR ARIDA  . SUBCLAVIAN ARTERY STENT  05/2012   "2 stents" (12/26/2012)  . TOOTH EXTRACTION  Spring 2017   mulitple (6)  . VENA CAVA FILTER PLACEMENT  07/2012   Removable   Family History  Problem Relation Age of Onset  . Alcohol abuse Father   . Cirrhosis Father   . Hypothyroidism Sister    Social History   Socioeconomic History  . Marital status: Married    Spouse name: Not on file  . Number of children: 2  . Years of education: Not on file  . Highest education level: Not on file  Occupational History  . Occupation: Retired    Fish farm manager: RETIRED    Comment: [employer]  Social Needs  . Financial resource strain: Not hard at all  . Food insecurity    Worry: Never true    Inability: Never true  . Transportation needs    Medical: No    Non-medical: No  Tobacco Use  . Smoking status: Former Smoker     Packs/day: 1.00    Years: 50.00    Pack years: 50.00    Types: Cigarettes    Quit date: 12/25/2010    Years since quitting: 8.0  . Smokeless tobacco: Never Used  Substance and Sexual Activity  . Alcohol use: No  . Drug use: No  . Sexual activity: Not Currently  Lifestyle  . Physical activity    Days per week: 0 days    Minutes per session: 0 min  . Stress: Not at all  Relationships  . Social Herbalist on phone: Not on file    Gets together: Not on file    Attends religious service: Not on file    Active member of club or organization: Not on file  Attends meetings of clubs or organizations: Not on file    Relationship status: Not on file  Other Topics Concern  . Not on file  Social History Narrative   Exercsiing 3 times a week.    Moderate diet control.    O living will, no HCPOA.    Outpatient Encounter Medications as of 01/09/2019  Medication Sig  . amLODipine (NORVASC) 5 MG tablet TAKE 1 TABLET BY MOUTH EVERY DAY  . atorvastatin (LIPITOR) 20 MG tablet TAKE 1 TABLET BY MOUTH DAILY AT 6PM  . carvedilol (COREG) 6.25 MG tablet TAKE 1 TABLET BY MOUTH TWICE A DAY WITH MEALS  . levothyroxine (SYNTHROID) 150 MCG tablet TAKE ONE TABLET BY MOUTH EVERY DAY BEFORE BREAKFAST  . traMADol (ULTRAM) 50 MG tablet Take 1 tablet (50 mg total) by mouth every 6 (six) hours as needed for moderate pain.  Marland Kitchen warfarin (COUMADIN) 5 MG tablet TAKE ONE TABLET BY MOUTH EVERY DAY OR AS DIRECTED BY COUMADIN CLINIC  . [DISCONTINUED] Fluticasone-Salmeterol (ADVAIR DISKUS) 250-50 MCG/DOSE AEPB Inhale 1 puff into the lungs 2 (two) times daily.  . [DISCONTINUED] pravastatin (PRAVACHOL) 40 MG tablet Take 1 tablet (40 mg total) by mouth daily.   No facility-administered encounter medications on file as of 01/09/2019.     Activities of Daily Living In your present state of health, do you have any difficulty performing the following activities: 01/09/2019  Hearing? N  Vision? N  Difficulty  concentrating or making decisions? N  Walking or climbing stairs? N  Dressing or bathing? N  Doing errands, shopping? N  Preparing Food and eating ? N  Using the Toilet? N  In the past six months, have you accidently leaked urine? N  Do you have problems with loss of bowel control? N  Managing your Medications? N  Managing your Finances? N  Housekeeping or managing your Housekeeping? N  Some recent data might be hidden    Patient Care Team: Owens Loffler, MD as PCP - General (Family Medicine) Minna Merritts, MD as Consulting Physician (Cardiology) Owens Loffler, MD (Family Medicine)   Assessment:   This is a routine wellness examination for [place].  Exercise Activities and Dietary recommendations Current Exercise Habits: The patient does not participate in regular exercise at present, Exercise limited by: respiratory conditions(s)  Goals    . Increase physical activity     Starting 09/08/16, I will continue to walk at least 30 min daily.     . Patient Stated     01/09/2019, Patient states that he wants to increase his exercise in the future       Fall Risk Fall Risk  01/09/2019 11/27/2017 10/02/2017 09/08/2016 12/29/2015  Falls in the past year? 0 No No No No  Comment - Emmi Telephone Survey: data to providers prior to load - - Emmi Telephone Survey: data to providers prior to load  Number falls in past yr: 0 - - - -  Risk for fall due to : Medication side effect - - - -  Follow up Falls evaluation completed;Falls prevention discussed - - - -   Is the patient's home free of loose throw rugs in walkways, pet beds, electrical cords, etc?   yes      Grab bars in the bathroom? yes      Handrails on the stairs?   yes      Adequate lighting?   yes  Timed Get Up and Go Performed: n/a  Depression Screen PHQ 2/9 Scores 01/09/2019 10/02/2017  09/08/2016 08/06/2014  PHQ - 2 Score 0 0 0 0  PHQ- 9 Score 0 - - -    Cognitive Function MMSE - Mini Mental State Exam 01/09/2019  09/08/2016  Orientation to time 5 5  Orientation to Place 5 5  Registration 3 3  Attention/ Calculation 5 0  Recall 2 3  Language- name 2 objects - 0  Language- repeat 1 1  Language- follow 3 step command - 3  Language- read & follow direction - 0  Write a sentence - 0  Copy design - 0  Total score - 20  Mini Cog  Mini-Cog screen was completed. Maximum score is 22. A value of 0 denotes this part of the MMSE was not completed or the patient failed this part of the Mini-Cog screening.      Immunization History  Administered Date(s) Administered  . Pneumococcal Conjugate-13 02/20/2014  . Pneumococcal Polysaccharide-23 04/11/2008  . Td 09/07/2006    Qualifies for Shingles Vaccine? yes  Screening Tests Health Maintenance  Topic Date Due  . INFLUENZA VACCINE  07/24/2019 (Originally 11/24/2018)  . TETANUS/TDAP  09/06/2026 (Originally 09/06/2016)  . PNA vac Low Risk Adult  Completed   Cancer Screenings: Lung: Low Dose CT Chest recommended if Age 73-80 years, 30 pack-year currently smoking OR have quit w/in 15years. Patient does not qualify. Colorectal: no longer required  Additional Screenings:  Hepatitis C Screening: n/a      Plan:   Patient wants to increase his exercise regimen in the future.  I have personally reviewed and noted the following in the patient's chart:   . Medical and social history . Use of alcohol, tobacco or illicit drugs  . Current medications and supplements . Functional ability and status . Nutritional status . Physical activity . Advanced directives . List of other physicians . Hospitalizations, surgeries, and ER visits in previous 12 months . Vitals . Screenings to include cognitive, depression, and falls . Referrals and appointments  In addition, I have reviewed and discussed with patient certain preventive protocols, quality metrics, and best practice recommendations. A written personalized care plan for preventive services as well as  general preventive health recommendations were provided to patient.     Andrez Grime, LPN  [id number]

## 2019-01-14 ENCOUNTER — Other Ambulatory Visit: Payer: Self-pay

## 2019-01-14 ENCOUNTER — Ambulatory Visit (INDEPENDENT_AMBULATORY_CARE_PROVIDER_SITE_OTHER): Payer: Medicare Other | Admitting: Family Medicine

## 2019-01-14 ENCOUNTER — Encounter: Payer: Self-pay | Admitting: Family Medicine

## 2019-01-14 VITALS — BP 120/80 | HR 72 | Temp 98.7°F | Ht 68.5 in | Wt 172.2 lb

## 2019-01-14 DIAGNOSIS — I10 Essential (primary) hypertension: Secondary | ICD-10-CM

## 2019-01-14 DIAGNOSIS — Z79899 Other long term (current) drug therapy: Secondary | ICD-10-CM

## 2019-01-14 DIAGNOSIS — I2510 Atherosclerotic heart disease of native coronary artery without angina pectoris: Secondary | ICD-10-CM

## 2019-01-14 DIAGNOSIS — E7800 Pure hypercholesterolemia, unspecified: Secondary | ICD-10-CM

## 2019-01-14 DIAGNOSIS — E038 Other specified hypothyroidism: Secondary | ICD-10-CM

## 2019-01-14 DIAGNOSIS — I615 Nontraumatic intracerebral hemorrhage, intraventricular: Secondary | ICD-10-CM

## 2019-01-14 DIAGNOSIS — I255 Ischemic cardiomyopathy: Secondary | ICD-10-CM | POA: Diagnosis not present

## 2019-01-14 MED ORDER — AMLODIPINE BESYLATE 2.5 MG PO TABS: 2.5000 mg | ORAL_TABLET | Freq: Every day | ORAL | 3 refills | Status: DC

## 2019-01-14 NOTE — Progress Notes (Signed)
[name] Noy T. Mikie Misner, MD Primary Care and [place] at [facility] [place], [address] Phone: [phone]  FAX: [phone]  [name] - 78 y.o. male  MRN [id number]  Date of Birth: 02/13/1941  Visit Date: 01/14/2019  PCP: Owens Loffler, MD  Referred by: Owens Loffler, MD  Chief Complaint  Patient presents with  . Annual Exam    Part 2   Subjective:   [name] is a 78 y.o. very pleasant male patient who presents with the following:  Multiple medical problems.  He feels pretty well.  He does have significant COPD, but he is not using any inhalers now.  He did not think that they helped and there is financial burden.  He also has coronary disease, congestive heart failure, he is compliant with taking all of these medications.  He also has had some renal artery stenosis, peripheral vascular disease subclavian arterial sclerosis, and he has had multiple interventions for these.  He also has had a history of intraventricular hemorrhage.  He also has a history of chronic kidney disease stage III.  COPD, not using inhalers.   Thyroid: No symptoms. Labs reviewed. Denies cold / heat intolerance, dry skin, hair loss. No goiter.  Lab Results  Component Value Date   TSH 1.23 09/21/2017    Basic labwork today. No inhalers.  Check thyroid  Past Medical History, Surgical History, Social History, Family History, Problem List, Medications, and Allergies have been reviewed and updated if relevant.  Patient Active Problem List   Diagnosis Date Noted  . CKD (chronic kidney disease) stage 3, GFR 30-59 ml/min (HCC) 05/01/2013    Priority: High  . Intraventricular hemorrhage ([place]) 07/12/2012    Priority: High  . Ischemic cardiomyopathy 07/08/2011    Priority: High  . CAD (coronary artery disease), native coronary artery 07/07/2011    Priority: High  . COPD (chronic obstructive pulmonary disease) ([place])  01/08/2010    Priority: High  . Bilateral renal artery stenosis (HCC)     Priority: Medium  . HTN (hypertension) 07/15/2012    Priority: Medium  . Peripheral arterial disease ([place])     Priority: Medium  . Hypothyroidism 09/07/2006    Priority: Medium  . Long term (current) use of anticoagulants 01/27/2017  . DVT (deep venous thrombosis) ([place]) 12/29/2016  . Paroxysmal a-fib: briefly in ED now in NSR 05/24/2013  . Aortic calcification ([place]) 05/01/2013  . Subclavian arterial stenosis ([place]) 04/25/2011  . BPH (benign prostatic hyperplasia) 12/29/2010  . Abdominal aortic aneurysm ([place]) 11/06/2009  . HYPERCHOLESTEROLEMIA 10/09/2009  . TOBACCO ABUSE 04/11/2008  . PROSTATE SPECIFIC ANTIGEN, ELEVATED 04/11/2008    Past Medical History:  Diagnosis Date  . AAA (abdominal aortic aneurysm) ([place])   . Aortic calcification ([place]) 05/01/2013  . Arthritis    "hands; not dx'd" (12/26/2012)  . Bilateral renal artery stenosis ([place])    a. 06/2013 Angio/PTA: LRA: 95 (5x18 Herculink stent), RRA 60ost, celiac/SMA nl, RCIA patent stents extending into dist Ao, RIIA 70ost, REIA min irregs, LCIA patent stent, LIIA 60-70ost, LEIA min irregs.  . CHF (congestive heart failure) ([place])    Patient states he doesn't have CHF anymore per cardiologist.  . Coronary artery disease   . Hypertension   . Hypothyroidism   . Intraventricular hemorrhage ([place]) 07/12/2012  . Peripheral arterial disease ([place])    a. Previous left lower extremity stenting by Dr. Jamal Collin;  b. 12/2012 s/p bilat ostial common iliac stenting.  Marland Kitchen  Pneumonia   . Right leg DVT ([place]) 08/13/2012  . Subclavian artery stenosis, left ([place]) 05/2012   Status post stenting of the ostium and self-expanding stent placement to the left axillary artery  . Tobacco abuse    d/c 2012  . Venous insufficiency     Past Surgical History:  Procedure Laterality Date  . ABDOMINAL ANGIOGRAM  07/10/2013   WITH BI-FEMORAL RUNOFF       DR ARIDA  . ABDOMINAL AORTAGRAM N/A  12/26/2012   Procedure: ABDOMINAL Maxcine Ham;  Surgeon: Wellington Hampshire, MD;  Location: [facility] C[facility] LAB;  Service: Cardiovascular;  Laterality: N/A;  . ABDOMINAL AORTAGRAM N/A 07/10/2013   Procedure: ABDOMINAL Maxcine Ham;  Surgeon: Wellington Hampshire, MD;  Location: [facility] CATH LAB;  Service: Cardiovascular;  Laterality: N/A;  . ANGIOPLASTY / STENTING FEMORAL  05/2012  . ANGIOPLASTY / STENTING ILIAC Bilateral 12/26/2012  . ARCH AORTOGRAM N/A 06/20/2012   Procedure: ARCH AORTOGRAM;  Surgeon: Wellington Hampshire, MD;  Location: [facility] CATH LAB;  Service: Cardiovascular;  Laterality: N/A;  . CARDIAC CATHETERIZATION  2/14   MC  . CHOLECYSTECTOMY  1990's  . CORONARY ARTERY BYPASS GRAFT  07/11/2011   Procedure: CORONARY ARTERY BYPASS GRAFTING (CABG);  Surgeon: Grace Isaac, MD;  Location: [facility];  Service: Open Heart Surgery;  Laterality: N/A;  Times 3. On Pump. Using right greater saphenous vein and left internal mammary artery.   Marland Kitchen HERNIA REPAIR    . INSERTION OF ILIAC STENT Bilateral 12/26/2012   Procedure: INSERTION OF ILIAC STENT;  Surgeon: Wellington Hampshire, MD;  Location: [facility] CATH LAB;  Service: Cardiovascular;  Laterality: Bilateral;  Bilateral Common Iliac Artery  . LEFT AND RIGHT HEART CATHETERIZATION WITH CORONARY ANGIOGRAM  07/07/2011   Procedure: LEFT AND RIGHT HEART CATHETERIZATION WITH CORONARY ANGIOGRAM;  Surgeon: Minna Merritts, MD;  Location: [facility] CATH LAB;  Service: Cardiovascular;;  . LOWER EXTREMITY VENOGRAPHY Bilateral 12/30/2016   Procedure: Lower Extremity Venography;  Surgeon: Katha Cabal, MD;  Location: [facility] CV LAB;  Service: Cardiovascular;  Laterality: Bilateral;  . PERCUTANEOUS STENT INTERVENTION  06/20/2012   Procedure: PERCUTANEOUS STENT INTERVENTION;  Surgeon: Wellington Hampshire, MD;  Location: [facility] CATH LAB;  Service: Cardiovascular;;  . RENAL ARTERY ANGIOPLASTY Left 07/10/2013   DR ARIDA  . SUBCLAVIAN ARTERY STENT  05/2012   "2 stents" (12/26/2012)  . TOOTH EXTRACTION  Spring 2017    mulitple (6)  . VENA CAVA FILTER PLACEMENT  07/2012   Removable    Social History   Socioeconomic History  . Marital status: Married    Spouse name: Not on file  . Number of children: 2  . Years of education: Not on file  . Highest education level: Not on file  Occupational History  . Occupation: Retired    Fish farm manager: RETIRED    Comment: [employer]  Social Needs  . Financial resource strain: Not hard at all  . Food insecurity    Worry: Never true    Inability: Never true  . Transportation needs    Medical: No    Non-medical: No  Tobacco Use  . Smoking status: Former Smoker    Packs/day: 1.00    Years: 50.00    Pack years: 50.00    Types: Cigarettes    Quit date: 12/25/2010    Years since quitting: 8.0  . Smokeless tobacco: Never Used  Substance and Sexual Activity  . Alcohol use: No  . Drug use: No  . Sexual activity: Not Currently  Lifestyle  . Physical activity    Days per week: 0 days    Minutes per session: 0 min  . Stress: Not at all  Relationships  . Social Herbalist on phone: Not on file    Gets together: Not on file    Attends religious service: Not on file    Active member of club or organization: Not on file    Attends meetings of clubs or organizations: Not on file    Relationship status: Not on file  . Intimate partner violence    Fear of current or ex partner: No    Emotionally abused: No    Physically abused: No    Forced sexual activity: No  Other Topics Concern  . Not on file  Social History Narrative   Exercsiing 3 times a week.    Moderate diet control.    O living will, no HCPOA.    Family History  Problem Relation Age of Onset  . Alcohol abuse Father   . Cirrhosis Father   . Hypothyroidism Sister     Allergies  Allergen Reactions  . Plavix [Clopidogrel Bisulfate]     Brain hemorrhage prev while on plavix and aspirin    Medication list reviewed and updated in full in [facility].   GEN: No acute  illnesses, no fevers, chills. GI: No n/v/d, eating normally Pulm: No SOB Interactive and getting along well at home.  Otherwise, ROS is as per the HPI.  Objective:   BP 120/80   Pulse 72   Temp 98.7 F (37.1 C) (Temporal)   Ht 5' 8.5" (1.74 m)   Wt 172 lb 4 oz (78.1 kg)   SpO2 97%   BMI 25.81 kg/m   GEN: WDWN, NAD, Non-toxic, A & O x 3 HEENT: Atraumatic, Normocephalic. Neck supple. No masses, No LAD. Ears and Nose: No external deformity. CV: RRR, No M/G/R. No JVD. No thrill. No extra heart sounds. PULM: wheezing B EXTR: No c/c/e NEURO Normal gait.  PSYCH: Normally interactive. Conversant. Not depressed or anxious appearing.  Calm demeanor.   Laboratory and Imaging Data:  Assessment and Plan:     ICD-10-CM   1. Other specified hypothyroidism  E03.8 TSH    T3, free    T4, free  2. Ischemic cardiomyopathy  I25.5   3. Intraventricular hemorrhage (HCC)  I61.5   4. Coronary artery disease involving native coronary artery without angina pectoris, unspecified whether native or transplanted heart  I25.10   5. Essential hypertension  I10   6. Pure hypercholesterolemia  E78.00 Lipid panel  7. Encounter for long-term (current) use of medications  123456 Basic metabolic panel    CBC with Differential/Platelet    Hepatic function panel   He is having some difficulty taking his 5 mg Norvasc and having to cut it into pieces, so I am going to give him the 2.5 mg tablets.  We will check basic laboratories today, but overall he is doing well  Follow-up: No follow-ups on file.  Meds ordered this encounter  Medications  . amLODipine (NORVASC) 2.5 MG tablet    Sig: Take 1 tablet (2.5 mg total) by mouth daily.    Dispense:  90 tablet    Refill:  3   Orders Placed This Encounter  Procedures  . Basic metabolic panel  . CBC with Differential/Platelet  . Hepatic function panel  . Lipid panel  . TSH  . T3, free  . T4, free  Signed,  Maud Deed. Jartavious Mckimmy, MD   Outpatient  Encounter Medications as of 01/14/2019  Medication Sig  . atorvastatin (LIPITOR) 20 MG tablet TAKE 1 TABLET BY MOUTH DAILY AT 6PM  . carvedilol (COREG) 6.25 MG tablet TAKE 1 TABLET BY MOUTH TWICE A DAY WITH MEALS  . levothyroxine (SYNTHROID) 150 MCG tablet TAKE ONE TABLET BY MOUTH EVERY DAY BEFORE BREAKFAST  . warfarin (COUMADIN) 5 MG tablet TAKE ONE TABLET BY MOUTH EVERY DAY OR AS DIRECTED BY COUMADIN CLINIC  . [DISCONTINUED] amLODipine (NORVASC) 5 MG tablet TAKE 1 TABLET BY MOUTH EVERY DAY  . amLODipine (NORVASC) 2.5 MG tablet Take 1 tablet (2.5 mg total) by mouth daily.  . [DISCONTINUED] Fluticasone-Salmeterol (ADVAIR DISKUS) 250-50 MCG/DOSE AEPB Inhale 1 puff into the lungs 2 (two) times daily.  . [DISCONTINUED] pravastatin (PRAVACHOL) 40 MG tablet Take 1 tablet (40 mg total) by mouth daily.  . [DISCONTINUED] traMADol (ULTRAM) 50 MG tablet Take 1 tablet (50 mg total) by mouth every 6 (six) hours as needed for moderate pain.   No facility-administered encounter medications on file as of 01/14/2019.

## 2019-01-15 LAB — CBC WITH DIFFERENTIAL/PLATELET
Basophils Absolute: 0 10*3/uL (ref 0.0–0.1)
Basophils Relative: 0.4 % (ref 0.0–3.0)
Eosinophils Absolute: 0.3 10*3/uL (ref 0.0–0.7)
Eosinophils Relative: 3.7 % (ref 0.0–5.0)
HCT: 49.9 % (ref 39.0–52.0)
Hemoglobin: 16.5 g/dL (ref 13.0–17.0)
Lymphocytes Relative: 29.7 % (ref 12.0–46.0)
Lymphs Abs: 2.1 10*3/uL (ref 0.7–4.0)
MCHC: 33 g/dL (ref 30.0–36.0)
MCV: 89.9 fl (ref 78.0–100.0)
Monocytes Absolute: 0.7 10*3/uL (ref 0.1–1.0)
Monocytes Relative: 9.2 % (ref 3.0–12.0)
Neutro Abs: 4 10*3/uL (ref 1.4–7.7)
Neutrophils Relative %: 57 % (ref 43.0–77.0)
Platelets: 213 10*3/uL (ref 150.0–400.0)
RBC: 5.55 Mil/uL (ref 4.22–5.81)
RDW: 14.2 % (ref 11.5–15.5)
WBC: 7.1 10*3/uL (ref 4.0–10.5)

## 2019-01-15 LAB — HEPATIC FUNCTION PANEL
ALT: 8 U/L (ref 0–53)
AST: 13 U/L (ref 0–37)
Albumin: 4.6 g/dL (ref 3.5–5.2)
Alkaline Phosphatase: 95 U/L (ref 39–117)
Bilirubin, Direct: 0.2 mg/dL (ref 0.0–0.3)
Total Bilirubin: 0.8 mg/dL (ref 0.2–1.2)
Total Protein: 7.2 g/dL (ref 6.0–8.3)

## 2019-01-15 LAB — BASIC METABOLIC PANEL
BUN: 18 mg/dL (ref 6–23)
CO2: 29 mEq/L (ref 19–32)
Calcium: 10 mg/dL (ref 8.4–10.5)
Chloride: 102 mEq/L (ref 96–112)
Creatinine, Ser: 1.34 mg/dL (ref 0.40–1.50)
GFR: 51.61 mL/min — ABNORMAL LOW (ref >60.00)
Glucose, Bld: 89 mg/dL (ref 70–99)
Potassium: 5.3 mEq/L — ABNORMAL HIGH (ref 3.5–5.1)
Sodium: 139 mEq/L (ref 135–145)

## 2019-01-15 LAB — TSH: TSH: 1.51 u[IU]/mL (ref 0.35–4.50)

## 2019-01-15 LAB — T3, FREE: T3, Free: 2.7 pg/mL (ref 2.3–4.2)

## 2019-01-15 LAB — LIPID PANEL
Cholesterol: 123 mg/dL (ref 0–200)
HDL: 58.5 mg/dL (ref >39.00)
LDL Cholesterol: 46 mg/dL (ref 0–99)
NonHDL: 64.97
Total CHOL/HDL Ratio: 2
Triglycerides: 94 mg/dL (ref 0.0–149.0)
VLDL: 18.8 mg/dL (ref 0.0–40.0)

## 2019-01-15 LAB — T4, FREE: Free T4: 1.61 ng/dL — ABNORMAL HIGH (ref 0.60–1.60)

## 2019-01-16 ENCOUNTER — Other Ambulatory Visit: Payer: Self-pay | Admitting: Family Medicine

## 2019-01-18 ENCOUNTER — Encounter: Payer: Self-pay | Admitting: *Deleted

## 2019-01-31 ENCOUNTER — Other Ambulatory Visit: Payer: Self-pay

## 2019-01-31 ENCOUNTER — Ambulatory Visit (INDEPENDENT_AMBULATORY_CARE_PROVIDER_SITE_OTHER): Payer: Medicare Other | Admitting: General Practice

## 2019-01-31 DIAGNOSIS — Z7901 Long term (current) use of anticoagulants: Secondary | ICD-10-CM | POA: Diagnosis not present

## 2019-01-31 LAB — POCT INR: INR: 2 (ref 2.0–3.0)

## 2019-01-31 NOTE — Patient Instructions (Addendum)
Pre visit review using our clinic review tool, if applicable. No additional management support is needed unless otherwise documented below in the visit note.  Continue to take 1 tablet daily except 1/2 tablet on Sunday/Tuesdays and Thursdays.  Re-check in 6 weeks.    

## 2019-02-16 ENCOUNTER — Other Ambulatory Visit: Payer: Self-pay | Admitting: Family Medicine

## 2019-02-21 ENCOUNTER — Telehealth: Payer: Self-pay

## 2019-02-21 MED ORDER — ATORVASTATIN CALCIUM 20 MG PO TABS: ORAL_TABLET | ORAL | 0 refills | Status: DC

## 2019-02-21 NOTE — Telephone Encounter (Signed)
Requested Prescriptions   Signed Prescriptions Disp Refills  . atorvastatin (LIPITOR) 20 MG tablet 90 tablet 0    Sig: TAKE 1 TABLET BY MOUTH DAILY AT 6PM    Authorizing Provider: [name] A    Ordering User: Raelene Bott, Anatole Apollo L

## 2019-03-05 ENCOUNTER — Other Ambulatory Visit: Payer: Self-pay | Admitting: Family Medicine

## 2019-03-14 ENCOUNTER — Other Ambulatory Visit: Payer: Self-pay

## 2019-03-14 ENCOUNTER — Ambulatory Visit (INDEPENDENT_AMBULATORY_CARE_PROVIDER_SITE_OTHER): Payer: Medicare Other | Admitting: General Practice

## 2019-03-14 DIAGNOSIS — Z7901 Long term (current) use of anticoagulants: Secondary | ICD-10-CM | POA: Diagnosis not present

## 2019-03-14 LAB — POCT INR: INR: 2.7 (ref 2.0–3.0)

## 2019-03-14 NOTE — Patient Instructions (Addendum)
Pre visit review using our clinic review tool, if applicable. No additional management support is needed unless otherwise documented below in the visit note.  Continue to take 1 tablet daily except 1/2 tablet on Sunday/Tuesdays and Thursdays.  Re-check in 6 weeks.    

## 2019-04-01 ENCOUNTER — Other Ambulatory Visit: Payer: Self-pay | Admitting: Family Medicine

## 2019-04-01 ENCOUNTER — Other Ambulatory Visit: Payer: Self-pay | Admitting: Cardiovascular Disease

## 2019-05-02 ENCOUNTER — Ambulatory Visit (INDEPENDENT_AMBULATORY_CARE_PROVIDER_SITE_OTHER): Payer: Medicare Other | Admitting: General Practice

## 2019-05-02 ENCOUNTER — Other Ambulatory Visit: Payer: Self-pay

## 2019-05-02 DIAGNOSIS — Z7901 Long term (current) use of anticoagulants: Secondary | ICD-10-CM

## 2019-05-02 LAB — POCT INR: INR: 2.4 (ref 2.0–3.0)

## 2019-05-02 NOTE — Patient Instructions (Addendum)
Pre visit review using our clinic review tool, if applicable. No additional management support is needed unless otherwise documented below in the visit note.  Continue to take 1 tablet daily except 1/2 tablet on Sunday/Tuesdays and Thursdays.  Re-check in 6 weeks.

## 2019-05-03 ENCOUNTER — Ambulatory Visit: Payer: Medicare Other | Admitting: Cardiovascular Disease

## 2019-05-10 MED ORDER — WARFARIN SODIUM 5 MG PO TABS: ORAL_TABLET | ORAL | 1 refills | Status: DC

## 2019-05-10 NOTE — Telephone Encounter (Signed)
Patient is compliant with coumadin management, will refill X 27mths.

## 2019-05-10 NOTE — Addendum Note (Signed)
Addended by: Carter Kitten on: 05/10/2019 09:30 AM   Modules accepted: Orders

## 2019-05-14 ENCOUNTER — Other Ambulatory Visit: Payer: Self-pay

## 2019-05-14 ENCOUNTER — Ambulatory Visit (INDEPENDENT_AMBULATORY_CARE_PROVIDER_SITE_OTHER): Payer: Medicare Other | Admitting: Cardiovascular Disease

## 2019-05-14 ENCOUNTER — Encounter: Payer: Self-pay | Admitting: Cardiovascular Disease

## 2019-05-14 VITALS — BP 160/80 | HR 72 | Ht 69.0 in | Wt 175.5 lb

## 2019-05-14 DIAGNOSIS — I10 Essential (primary) hypertension: Secondary | ICD-10-CM | POA: Diagnosis not present

## 2019-05-14 DIAGNOSIS — I701 Atherosclerosis of renal artery: Secondary | ICD-10-CM

## 2019-05-14 DIAGNOSIS — I714 Abdominal aortic aneurysm, without rupture, unspecified: Secondary | ICD-10-CM

## 2019-05-14 DIAGNOSIS — I2510 Atherosclerotic heart disease of native coronary artery without angina pectoris: Secondary | ICD-10-CM

## 2019-05-14 DIAGNOSIS — I739 Peripheral vascular disease, unspecified: Secondary | ICD-10-CM | POA: Diagnosis not present

## 2019-05-14 DIAGNOSIS — I6523 Occlusion and stenosis of bilateral carotid arteries: Secondary | ICD-10-CM

## 2019-05-14 NOTE — Progress Notes (Signed)
Cardiology Office Note   Date:  05/14/2019   ID:  [name], DOB 1941/03/12, MRN [id number]  PCP:  Owens Loffler, MD  Cardiologist:   [name], MD   Chief Complaint  Patient presents with  . office visit    No new concerns; Meds verbally reviewed with patient.      History of Present Illness: [name] is a 79 y.o. male who presents for a followup visit regarding extensive peripheral arterial disease with previous stenting of the left subclavian artery and left axillary artery, bilateral common iliac artery stenting and  left renal artery stenting. The patient has multiple medical problems including hyperlipidemia, Small AAA, long smoking history for 60 years, COPD , moderate carotid disease, and atherosclerotic coronary artery disease status post CABG in March of 2013 with LIMA to the LAD, vein graft to the PDA, vein graft to the OM. He does have mild aortic valve stenosis.  He had a small intracranial hemorrhage in 2014 which was suspected to be due to malignant hypertension and dual antiplatelet therapy. Shortly after that he was diagnosed with extensive right-sided DVT. Anticoagulation was contraindicated and thus an IVC filter was placed.   He had extensive bilateral occlusive DVT in 2018 which required catheter-based therapy.  He has been on anticoagulation since then.  He has done well since then with no significant leg edema.  He continues to use support stockings.  He describes mild bilateral calf claudication with overexertion.  He denies any chest pain.  He reports stable exertional dyspnea.   Past Medical History:  Diagnosis Date  . AAA (abdominal aortic aneurysm) ([place])   . Aortic calcification ([place]) 05/01/2013  . Arthritis    "hands; not dx'd" (12/26/2012)  . Bilateral renal artery stenosis ([place])    a. 06/2013 Angio/PTA: LRA: 95 (5x18 Herculink stent), RRA 60ost, celiac/SMA nl, RCIA patent stents extending into dist Ao, RIIA 70ost, REIA min irregs, LCIA  patent stent, LIIA 60-70ost, LEIA min irregs.  . CHF (congestive heart failure) ([place])    Patient states he doesn't have CHF anymore per cardiologist.  . Coronary artery disease   . Hypertension   . Hypothyroidism   . Intraventricular hemorrhage ([place]) 07/12/2012  . Peripheral arterial disease ([place])    a. Previous left lower extremity stenting by Dr. Jamal Collin;  b. 12/2012 s/p bilat ostial common iliac stenting.  . Pneumonia   . Right leg DVT ([place]) 08/13/2012  . Subclavian artery stenosis, left ([place]) 05/2012   Status post stenting of the ostium and self-expanding stent placement to the left axillary artery  . Tobacco abuse    d/c 2012  . Venous insufficiency     Past Surgical History:  Procedure Laterality Date  . ABDOMINAL ANGIOGRAM  07/10/2013   WITH BI-FEMORAL RUNOFF       DR Betti Goodenow  . ABDOMINAL AORTAGRAM N/A 12/26/2012   Procedure: ABDOMINAL Maxcine Ham;  Surgeon: Wellington Hampshire, MD;  Location: [facility] CATH LAB;  Service: Cardiovascular;  Laterality: N/A;  . ABDOMINAL AORTAGRAM N/A 07/10/2013   Procedure: ABDOMINAL Maxcine Ham;  Surgeon: Wellington Hampshire, MD;  Location: [facility] CATH LAB;  Service: Cardiovascular;  Laterality: N/A;  . ANGIOPLASTY / STENTING FEMORAL  05/2012  . ANGIOPLASTY / STENTING ILIAC Bilateral 12/26/2012  . ARCH AORTOGRAM N/A 06/20/2012   Procedure: ARCH AORTOGRAM;  Surgeon: Wellington Hampshire, MD;  Location: [facility] CATH LAB;  Service: Cardiovascular;  Laterality: N/A;  . CARDIAC CATHETERIZATION  2/14   MC  . CHOLECYSTECTOMY  1990's  . CORONARY ARTERY BYPASS GRAFT  07/11/2011   Procedure: CORONARY ARTERY BYPASS GRAFTING (CABG);  Surgeon: Grace Isaac, MD;  Location: [facility];  Service: Open Heart Surgery;  Laterality: N/A;  Times 3. On Pump. Using right greater saphenous vein and left internal mammary artery.   Marland Kitchen HERNIA REPAIR    . INSERTION OF ILIAC STENT Bilateral 12/26/2012   Procedure: INSERTION OF ILIAC STENT;  Surgeon: Wellington Hampshire, MD;  Location: [facility] C[facility] LAB;  Service:  Cardiovascular;  Laterality: Bilateral;  Bilateral Common Iliac Artery  . LEFT AND RIGHT HEART CATHETERIZATION WITH CORONARY ANGIOGRAM  07/07/2011   Procedure: LEFT AND RIGHT HEART CATHETERIZATION WITH CORONARY ANGIOGRAM;  Surgeon: Minna Merritts, MD;  Location: [facility] C[facility] LAB;  Service: Cardiovascular;;  . LOWER EXTREMITY VENOGRAPHY Bilateral 12/30/2016   Procedure: Lower Extremity Venography;  Surgeon: Katha Cabal, MD;  Location: [facility] CV LAB;  Service: Cardiovascular;  Laterality: Bilateral;  . PERCUTANEOUS STENT INTERVENTION  06/20/2012   Procedure: PERCUTANEOUS STENT INTERVENTION;  Surgeon: Wellington Hampshire, MD;  Location: [facility] CATH LAB;  Service: Cardiovascular;;  . RENAL ARTERY ANGIOPLASTY Left 07/10/2013   DR Peng Thorstenson  . SUBCLAVIAN ARTERY STENT  05/2012   "2 stents" (12/26/2012)  . TOOTH EXTRACTION  Spring 2017   mulitple (6)  . VENA CAVA FILTER PLACEMENT  07/2012   Removable     Current Outpatient Medications  Medication Sig Dispense Refill  . amLODipine (NORVASC) 2.5 MG tablet Take 1 tablet (2.5 mg total) by mouth daily. 90 tablet 3  . atorvastatin (LIPITOR) 20 MG tablet TAKE 1 TABLET BY MOUTH DAILY AT 6PM 90 tablet 0  . carvedilol (COREG) 6.25 MG tablet TAKE 1 TABLET BY MOUTH TWICE A DAY WITH MEALS 180 tablet 3  . levothyroxine (SYNTHROID) 150 MCG tablet TAKE ONE TABLET BY MOUTH EVERY DAY BEFORE BREAKFAST 90 tablet 3  . warfarin (COUMADIN) 5 MG tablet TAKE ONE TABLET BY MOUTH EVERY DAY OR AS DIRECTED BY COUMADIN CLINIC 90 tablet 1   No current facility-administered medications for this visit.    Allergies:   Plavix [clopidogrel bisulfate]    Social History:  The patient  reports that he quit smoking about 8 years ago. His smoking use included cigarettes. He has a 50.00 pack-year smoking history. He has never used smokeless tobacco. He reports that he does not drink alcohol or use drugs.   Family History:  The patient's family history includes Alcohol abuse in his father;  Cirrhosis in his father; Hypothyroidism in his sister.    ROS:  Please see the history of present illness.   Otherwise, review of systems are positive for none.   All other systems are reviewed and negative.    PHYSICAL EXAM: VS:  BP (!) 160/80 (BP Location: Left Arm, Patient Position: Sitting, Cuff Size: Normal)   Pulse 72   Ht 5\' 9"  (1.753 m)   Wt 175 lb 8 oz (79.6 kg)   BMI 25.92 kg/m  , BMI Body mass index is 25.92 kg/m. GEN: Well nourished, well developed, in no acute distress  HEENT: normal  Neck: no JVD or masses. There is left carotid bruit and a bruit in the left subclavian artery area. Cardiac: RRR; no , rubs, or gallops,no edema . 2/6 systolic ejection murmur in the aortic area Respiratory:  clear to auscultation bilaterally, normal work of breathing GI: soft, nontender, nondistended, + BS MS: no deformity or atrophy  Skin: warm and dry, no rash Neuro:  Strength  and sensation are intact Psych: euthymic mood, full affect Vascular: Femoral pulses +1 bilaterally.  Radial pulses normal on the right side and slightly diminished on the left side.   EKG:  EKG is ordered today. The ekg ordered today demonstrates normal sinus rhythm with no significant ST or T wave changes.   Recent Labs: 01/14/2019: ALT 8; BUN 18; Creatinine, Ser 1.34; Hemoglobin 16.5; Platelets 213.0; Potassium 5.3; Sodium 139; TSH 1.51    Lipid Panel    Component Value Date/Time   CHOL 123 01/14/2019 1528   CHOL 125 01/31/2014 0756   TRIG 94.0 01/14/2019 1528   HDL 58.50 01/14/2019 1528   HDL 63 01/31/2014 0756   CHOLHDL 2 01/14/2019 1528   VLDL 18.8 01/14/2019 1528   LDLCALC 46 01/14/2019 1528   LDLCALC 46 01/31/2014 0756      Wt Readings from Last 3 Encounters:  05/14/19 175 lb 8 oz (79.6 kg)  01/14/19 172 lb 4 oz (78.1 kg)  01/09/19 170 lb (77.1 kg)        ASSESSMENT AND PLAN:   1. Coronary artery disease involving native coronary arteries without angina: He is doing well overall  with stable exertional dyspnea.  Continue medical therapy.  2. Bilateral carotid artery stenosis: Continue aggressive treatment of risk factors .  I requested a follow-up carotid Doppler.  3.  Bilateral renal artery stenosis: Status post left renal artery stenting.  Given elevated blood pressure no recent follow-up, I requested renal artery duplex.  4. Abdominal aortic aneurysm: This was small on most recent ultrasound and will be evaluated during renal artery duplex.  5 . Essential hypertension: Blood pressure is elevated but he reports it is due to [place] medical mall [place] our office.  His blood pressure usually controlled.  Continue to monitor for now and we can consider increasing amlodipine if needed.  6. Peripheral arterial disease with previous iliac stenting.  Palpable femoral pulse bilaterally.  This can be checked with upcoming renal artery duplex.  7.  Status post left subclavian artery stenting. Left radial pulse is near normal.  8.  History of previous extensive lower extremity DVT.  Currently on lifelong anticoagulation with warfarin which is followed by his primary care physician.  9.  Hyperlipidemia: Continue treatment with atorvastatin.  I reviewed most recent lipid profile which showed an LDL of 46.   Disposition:   Follow-up with me in 6 months.  Signed,  [name], MD  05/14/2019 2:51 PM    [facility]

## 2019-05-14 NOTE — Patient Instructions (Signed)
Medication Instructions:  Your physician recommends that you continue on your current medications as directed. Please refer to the Current Medication list given to you today.  *If you need a refill on your cardiac medications before your next appointment, please call your pharmacy*  Lab Work: None ordered If you have labs (blood work) drawn today and your tests are completely normal, you will receive your results only by: Marland Kitchen MyChart Message (if you have MyChart) OR . A paper copy in the mail If you have any lab test that is abnormal or we need to change your treatment, we will call you to review the results.  Testing/Procedures: Your physician has requested that you have a carotid duplex. This test is an ultrasound of the carotid arteries in your neck. It looks at blood flow through these arteries that supply the brain with blood. Allow one hour for this exam. There are no restrictions or special instructions.  Your physician has requested that you have a renal artery duplex. During this test, an ultrasound is used to evaluate blood flow to the kidneys. Allow one hour for this exam. Do not eat after midnight the day before and avoid carbonated beverages. Take your medications as you usually do.    Follow-Up: At [facility], you and your health needs are our priority.  As part of our continuing mission to provide you with exceptional heart care, we have created designated Provider Care Teams.  These Care Teams include your primary Cardiologist (physician) and Advanced Practice Providers (APPs -  Physician Assistants and Nurse Practitioners) who all work together to provide you with the care you need, when you need it.  Your next appointment:   6 month(s)  The format for your next appointment:   In Person  Provider:    You may see  Dr. Fletcher Anon or one of the following Advanced Practice Providers on your designated Care Team:    Murray Hodgkins, NP  Christell Faith, PA-C  Marrianne Mood, PA-C   Other Instructions N/A

## 2019-05-20 ENCOUNTER — Ambulatory Visit (INDEPENDENT_AMBULATORY_CARE_PROVIDER_SITE_OTHER): Payer: Medicare Other

## 2019-05-20 ENCOUNTER — Other Ambulatory Visit: Payer: Self-pay

## 2019-05-20 ENCOUNTER — Other Ambulatory Visit: Payer: Self-pay | Admitting: Cardiovascular Disease

## 2019-05-20 DIAGNOSIS — I6523 Occlusion and stenosis of bilateral carotid arteries: Secondary | ICD-10-CM

## 2019-05-20 DIAGNOSIS — I701 Atherosclerosis of renal artery: Secondary | ICD-10-CM

## 2019-05-21 ENCOUNTER — Other Ambulatory Visit: Payer: Self-pay

## 2019-05-21 DIAGNOSIS — I714 Abdominal aortic aneurysm, without rupture, unspecified: Secondary | ICD-10-CM

## 2019-05-21 DIAGNOSIS — I771 Stricture of artery: Secondary | ICD-10-CM

## 2019-06-13 ENCOUNTER — Ambulatory Visit: Payer: Medicare Other

## 2019-06-20 ENCOUNTER — Ambulatory Visit (INDEPENDENT_AMBULATORY_CARE_PROVIDER_SITE_OTHER): Payer: Medicare Other

## 2019-06-20 ENCOUNTER — Other Ambulatory Visit: Payer: Self-pay

## 2019-06-20 DIAGNOSIS — Z7901 Long term (current) use of anticoagulants: Secondary | ICD-10-CM

## 2019-06-20 LAB — POCT INR: INR: 2 (ref 2.0–3.0)

## 2019-06-20 NOTE — Patient Instructions (Addendum)
Pre visit review using our clinic review tool, if applicable. No additional management support is needed unless otherwise documented below in the visit note.  Continue to take 1 tablet daily except 1/2 tablet on Sunday/Tuesdays and Thursdays.  Re-check in 6 weeks.

## 2019-07-04 ENCOUNTER — Other Ambulatory Visit: Payer: Self-pay | Admitting: Cardiovascular Disease

## 2019-08-01 ENCOUNTER — Ambulatory Visit (INDEPENDENT_AMBULATORY_CARE_PROVIDER_SITE_OTHER): Payer: Medicare Other

## 2019-08-01 ENCOUNTER — Other Ambulatory Visit: Payer: Self-pay

## 2019-08-01 DIAGNOSIS — Z7901 Long term (current) use of anticoagulants: Secondary | ICD-10-CM

## 2019-08-01 LAB — POCT INR: INR: 2 (ref 2.0–3.0)

## 2019-08-01 NOTE — Patient Instructions (Addendum)
Pre visit review using our clinic review tool, if applicable. No additional management support is needed unless otherwise documented below in the visit note.  Continue to take 1 tablet daily except 1/2 tablet on Sunday/Tuesdays and Thursdays.  Re-check in 6 weeks.

## 2019-09-12 ENCOUNTER — Other Ambulatory Visit: Payer: Self-pay

## 2019-09-12 ENCOUNTER — Ambulatory Visit (INDEPENDENT_AMBULATORY_CARE_PROVIDER_SITE_OTHER): Payer: Medicare Other

## 2019-09-12 DIAGNOSIS — Z7901 Long term (current) use of anticoagulants: Secondary | ICD-10-CM

## 2019-09-12 LAB — POCT INR: INR: 2.2 (ref 2.0–3.0)

## 2019-09-12 NOTE — Patient Instructions (Addendum)
Pre visit review using our clinic review tool, if applicable. No additional management support is needed unless otherwise documented below in the visit note.  Continue to take 1 tablet daily except 1/2 tablet on Sunday/Tuesdays and Thursdays.  Re-check in 6 weeks.

## 2019-10-24 ENCOUNTER — Ambulatory Visit: Payer: Medicare Other

## 2019-10-31 ENCOUNTER — Other Ambulatory Visit: Payer: Self-pay

## 2019-10-31 ENCOUNTER — Ambulatory Visit (INDEPENDENT_AMBULATORY_CARE_PROVIDER_SITE_OTHER): Payer: Medicare Other

## 2019-10-31 DIAGNOSIS — Z7901 Long term (current) use of anticoagulants: Secondary | ICD-10-CM

## 2019-10-31 LAB — POCT INR: INR: 1.8 — AB (ref 2.0–3.0)

## 2019-10-31 NOTE — Patient Instructions (Addendum)
Pre visit review using our clinic review tool, if applicable. No additional management support is needed unless otherwise documented below in the visit note.  Increase dose today to 5mg  then continue to take 1 tablet daily except 1/2 tablet on Sunday/Tuesdays and Thursdays.  Re-check in 4 weeks.

## 2019-11-09 ENCOUNTER — Other Ambulatory Visit: Payer: Self-pay | Admitting: Family Medicine

## 2019-11-09 DIAGNOSIS — Z7901 Long term (current) use of anticoagulants: Secondary | ICD-10-CM

## 2019-11-11 NOTE — Telephone Encounter (Signed)
Pt compliant with coumadin management. Sent in script 

## 2019-11-19 ENCOUNTER — Encounter: Payer: Self-pay | Admitting: Cardiovascular Disease

## 2019-11-19 ENCOUNTER — Other Ambulatory Visit: Payer: Self-pay

## 2019-11-19 ENCOUNTER — Ambulatory Visit (INDEPENDENT_AMBULATORY_CARE_PROVIDER_SITE_OTHER): Payer: Medicare Other | Admitting: Cardiovascular Disease

## 2019-11-19 VITALS — BP 170/80 | HR 75 | Ht 69.0 in | Wt 170.4 lb

## 2019-11-19 DIAGNOSIS — I701 Atherosclerosis of renal artery: Secondary | ICD-10-CM | POA: Diagnosis not present

## 2019-11-19 DIAGNOSIS — I2510 Atherosclerotic heart disease of native coronary artery without angina pectoris: Secondary | ICD-10-CM | POA: Diagnosis not present

## 2019-11-19 DIAGNOSIS — I779 Disorder of arteries and arterioles, unspecified: Secondary | ICD-10-CM | POA: Diagnosis not present

## 2019-11-19 DIAGNOSIS — I10 Essential (primary) hypertension: Secondary | ICD-10-CM | POA: Diagnosis not present

## 2019-11-19 DIAGNOSIS — I739 Peripheral vascular disease, unspecified: Secondary | ICD-10-CM | POA: Diagnosis not present

## 2019-11-19 DIAGNOSIS — E785 Hyperlipidemia, unspecified: Secondary | ICD-10-CM

## 2019-11-19 MED ORDER — AMLODIPINE BESYLATE 5 MG PO TABS: 5.0000 mg | ORAL_TABLET | Freq: Every day | ORAL | 2 refills | Status: DC

## 2019-11-19 NOTE — Patient Instructions (Signed)
Medication Instructions:  Your physician has recommended you make the following change in your medication:   INCREASE Amlodipine 5 mg daily. An Rx has been sent to your pharmacy.  *If you need a refill on your cardiac medications before your next appointment, please call your pharmacy*   Lab Work: None ordered If you have labs (blood work) drawn today and your tests are completely normal, you will receive your results only by: Marland Kitchen MyChart Message (if you have MyChart) OR . A paper copy in the mail If you have any lab test that is abnormal or we need to change your treatment, we will call you to review the results.   Testing/Procedures: None ordered   Follow-Up: At [facility], you and your health needs are our priority.  As part of our continuing mission to provide you with exceptional heart care, we have created designated Provider Care Teams.  These Care Teams include your primary Cardiologist (physician) and Advanced Practice Providers (APPs -  Physician Assistants and Nurse Practitioners) who all work together to provide you with the care you need, when you need it.  We recommend signing up for the patient portal called "MyChart".  Sign up information is provided on this After Visit Summary.  MyChart is used to connect with patients for Virtual Visits (Telemedicine).  Patients are able to view lab/test results, encounter notes, upcoming appointments, etc.  Non-urgent messages can be sent to your provider as well.   To learn more about what you can do with MyChart, go to [email or web address].    Your next appointment:   6 month(s)  The format for your next appointment:   In Person  Provider:    You may see Dr. Fletcher Anon or one of the following Advanced Practice Providers on your designated Care Team:    Murray Hodgkins, NP  Christell Faith, PA-C  Marrianne Mood, PA-C    Other Instructions N/A

## 2019-11-19 NOTE — Progress Notes (Signed)
Cardiology Office Note   Date:  11/19/2019   ID:  [name], DOB 1941/03/03, MRN [id number]  PCP:  Owens Loffler, MD  Cardiologist:   [name], MD   Chief Complaint  Patient presents with  . office visit    6 month F/U; Meds verbally reviewed with patient.      History of Present Illness: [name] is a 79 y.o. male who presents for a followup visit regarding extensive peripheral arterial disease with previous stenting of the left subclavian artery and left axillary artery, bilateral common iliac artery stenting and  left renal artery stenting. The patient has multiple medical problems including hyperlipidemia, Small AAA, long smoking history for 60 years, COPD , moderate carotid disease, and atherosclerotic coronary artery disease status post CABG in March of 2013 with LIMA to the LAD, vein graft to the PDA, vein graft to the OM. He does have mild aortic valve stenosis.  He had a small intracranial hemorrhage in 2014 which was suspected to be due to malignant hypertension and dual antiplatelet therapy. Shortly after that he was diagnosed with extensive right-sided DVT. Anticoagulation was contraindicated and thus an IVC filter was placed.   He had extensive bilateral occlusive DVT in 2018 which required catheter-based therapy.  He has been on anticoagulation since then.   He has been doing well with no recent chest pain or worsening dyspnea.  No leg claudication.  He has mild left arm numbness.   Past Medical History:  Diagnosis Date  . AAA (abdominal aortic aneurysm) ([place])   . Aortic calcification ([place]) 05/01/2013  . Arthritis    "hands; not dx'd" (12/26/2012)  . Bilateral renal artery stenosis ([place])    a. 06/2013 Angio/PTA: LRA: 95 (5x18 Herculink stent), RRA 60ost, celiac/SMA nl, RCIA patent stents extending into dist Ao, RIIA 70ost, REIA min irregs, LCIA patent stent, LIIA 60-70ost, LEIA min irregs.  . CHF (congestive heart failure) ([place])    Patient states he  doesn't have CHF anymore per cardiologist.  . Coronary artery disease   . Hypertension   . Hypothyroidism   . Intraventricular hemorrhage ([place]) 07/12/2012  . Peripheral arterial disease ([place])    a. Previous left lower extremity stenting by Dr. Jamal Collin;  b. 12/2012 s/p bilat ostial common iliac stenting.  . Pneumonia   . Right leg DVT ([place]) 08/13/2012  . Subclavian artery stenosis, left ([place]) 05/2012   Status post stenting of the ostium and self-expanding stent placement to the left axillary artery  . Tobacco abuse    d/c 2012  . Venous insufficiency     Past Surgical History:  Procedure Laterality Date  . ABDOMINAL ANGIOGRAM  07/10/2013   WITH BI-FEMORAL RUNOFF       DR Jaree Dwight  . ABDOMINAL AORTAGRAM N/A 12/26/2012   Procedure: ABDOMINAL Maxcine Ham;  Surgeon: Wellington Hampshire, MD;  Location: [facility] CATH LAB;  Service: Cardiovascular;  Laterality: N/A;  . ABDOMINAL AORTAGRAM N/A 07/10/2013   Procedure: ABDOMINAL Maxcine Ham;  Surgeon: Wellington Hampshire, MD;  Location: [facility] CATH LAB;  Service: Cardiovascular;  Laterality: N/A;  . ANGIOPLASTY / STENTING FEMORAL  05/2012  . ANGIOPLASTY / STENTING ILIAC Bilateral 12/26/2012  . ARCH AORTOGRAM N/A 06/20/2012   Procedure: ARCH AORTOGRAM;  Surgeon: Wellington Hampshire, MD;  Location: [facility] CATH LAB;  Service: Cardiovascular;  Laterality: N/A;  . CARDIAC CATHETERIZATION  2/14   [facility]  . CHOLECYSTECTOMY  1990's  . CORONARY ARTERY BYPASS GRAFT  07/11/2011   Procedure: CORONARY ARTERY  BYPASS GRAFTING (CABG);  Surgeon: Grace Isaac, MD;  Location: [facility];  Service: Open Heart Surgery;  Laterality: N/A;  Times 3. On Pump. Using right greater saphenous vein and left internal mammary artery.   Marland Kitchen HERNIA REPAIR    . INSERTION OF ILIAC STENT Bilateral 12/26/2012   Procedure: INSERTION OF ILIAC STENT;  Surgeon: Wellington Hampshire, MD;  Location: [facility] CATH LAB;  Service: Cardiovascular;  Laterality: Bilateral;  Bilateral Common Iliac Artery  . LEFT AND RIGHT HEART CATHETERIZATION WITH  CORONARY ANGIOGRAM  07/07/2011   Procedure: LEFT AND RIGHT HEART CATHETERIZATION WITH CORONARY ANGIOGRAM;  Surgeon: Minna Merritts, MD;  Location: [facility] CATH LAB;  Service: Cardiovascular;;  . LOWER EXTREMITY VENOGRAPHY Bilateral 12/30/2016   Procedure: Lower Extremity Venography;  Surgeon: Katha Cabal, MD;  Location: [facility] CV LAB;  Service: Cardiovascular;  Laterality: Bilateral;  . PERCUTANEOUS STENT INTERVENTION  06/20/2012   Procedure: PERCUTANEOUS STENT INTERVENTION;  Surgeon: Wellington Hampshire, MD;  Location: [facility] CATH LAB;  Service: Cardiovascular;;  . RENAL ARTERY ANGIOPLASTY Left 07/10/2013   DR Nilda Keathley  . SUBCLAVIAN ARTERY STENT  05/2012   "2 stents" (12/26/2012)  . TOOTH EXTRACTION  Spring 2017   mulitple (6)  . VENA CAVA FILTER PLACEMENT  07/2012   Removable     Current Outpatient Medications  Medication Sig Dispense Refill  . amLODipine (NORVASC) 2.5 MG tablet Take 1 tablet (2.5 mg total) by mouth daily. 90 tablet 3  . atorvastatin (LIPITOR) 20 MG tablet TAKE 1 TABLET BY MOUTH DAILY AT 6PM 90 tablet 0  . carvedilol (COREG) 6.25 MG tablet TAKE 1 TABLET BY MOUTH TWICE A DAY WITH MEALS 180 tablet 3  . levothyroxine (SYNTHROID) 150 MCG tablet TAKE ONE TABLET BY MOUTH EVERY DAY BEFORE BREAKFAST 90 tablet 3  . warfarin (COUMADIN) 5 MG tablet TAKE ONE TABLET BY MOUTH EVERY DAY OR AS DIRECTED BY COUMADIN CLINIC 90 tablet 1   No current facility-administered medications for this visit.    Allergies:   Plavix [clopidogrel bisulfate]    Social History:  The patient  reports that he quit smoking about 8 years ago. His smoking use included cigarettes. He has a 50.00 pack-year smoking history. He has never used smokeless tobacco. He reports that he does not drink alcohol and does not use drugs.   Family History:  The patient's family history includes Alcohol abuse in his father; Cirrhosis in his father; Hypothyroidism in his sister.    ROS:  Please see the history of present  illness.   Otherwise, review of systems are positive for none.   All other systems are reviewed and negative.    PHYSICAL EXAM: VS:  BP (!) 170/80 (BP Location: Left Arm, Patient Position: Sitting, Cuff Size: Normal)   Pulse 75   Ht 5\' 9"  (1.753 m)   Wt 170 lb 6 oz (77.3 kg)   SpO2 99%   BMI 25.16 kg/m  , BMI Body mass index is 25.16 kg/m. GEN: Well nourished, well developed, in no acute distress  HEENT: normal  Neck: no JVD or masses. There is left carotid bruit and a bruit in the left subclavian artery area. Cardiac: RRR; no , rubs, or gallops,no edema . 2/6 systolic ejection murmur in the aortic area Respiratory:  clear to auscultation bilaterally, normal work of breathing GI: soft, nontender, nondistended, + BS MS: no deformity or atrophy  Skin: warm and dry, no rash Neuro:  Strength and sensation are intact Psych: euthymic mood, full  affect Vascular: Femoral pulses +1 bilaterally.  Radial pulses normal on the right side and slightly diminished on the left side.   EKG:  EKG is ordered today. The ekg ordered today demonstrates normal sinus rhythm with incomplete right bundle branch block.   Recent Labs: 01/14/2019: ALT 8; BUN 18; Creatinine, Ser 1.34; Hemoglobin 16.5; Platelets 213.0; Potassium 5.3; Sodium 139; TSH 1.51    Lipid Panel    Component Value Date/Time   CHOL 123 01/14/2019 1528   CHOL 125 01/31/2014 0756   TRIG 94.0 01/14/2019 1528   HDL 58.50 01/14/2019 1528   HDL 63 01/31/2014 0756   CHOLHDL 2 01/14/2019 1528   VLDL 18.8 01/14/2019 1528   LDLCALC 46 01/14/2019 1528   LDLCALC 46 01/31/2014 0756      Wt Readings from Last 3 Encounters:  11/19/19 170 lb 6 oz (77.3 kg)  05/14/19 175 lb 8 oz (79.6 kg)  01/14/19 172 lb 4 oz (78.1 kg)        ASSESSMENT AND PLAN:   1. Coronary artery disease involving native coronary arteries without angina: He is doing well overall with stable exertional dyspnea.  Continue medical therapy.  2. Bilateral carotid  artery stenosis: Continue aggressive treatment of risk factors .  Repeat carotid Doppler in January  3.  Bilateral renal artery stenosis: Status post left renal artery stenting.  Most recent duplex did not show clear evidence of disease bilaterally although the left side was not well visualized.  4. Abdominal aortic aneurysm: This was small on most recent ultrasound and will repeat in January.  5 . Essential hypertension: Blood pressure continues to be elevated.  I increased amlodipine to 5 mg once daily.  6. Peripheral arterial disease with previous iliac stenting.  No claudication.  7.  Status post left subclavian artery stenting. Left radial pulse is near normal.  8.  History of previous extensive lower extremity DVT.  Currently on lifelong anticoagulation with warfarin which is followed by his primary care physician.  9.  Hyperlipidemia: Continue treatment with atorvastatin.  I reviewed most recent lipid profile which showed an LDL of 46.   Disposition:   Follow-up with me in 6 months.  Signed,  [name], MD  11/19/2019 2:39 PM    [place]

## 2019-11-28 ENCOUNTER — Ambulatory Visit (INDEPENDENT_AMBULATORY_CARE_PROVIDER_SITE_OTHER): Payer: Medicare Other

## 2019-11-28 ENCOUNTER — Other Ambulatory Visit: Payer: Self-pay

## 2019-11-28 DIAGNOSIS — Z7901 Long term (current) use of anticoagulants: Secondary | ICD-10-CM

## 2019-11-28 LAB — POCT INR: INR: 1.8 — AB (ref 2.0–3.0)

## 2019-11-28 NOTE — Patient Instructions (Addendum)
Pre visit review using our clinic review tool, if applicable. No additional management support is needed unless otherwise documented below in the visit note.  Increase dose today to 5mg  then continue to take 1 tablet daily except 1/2 tablet on Sunday and Thursdays.  Re-check in 5 weeks

## 2019-12-01 ENCOUNTER — Other Ambulatory Visit: Payer: Self-pay | Admitting: Cardiovascular Disease

## 2019-12-17 DIAGNOSIS — H2513 Age-related nuclear cataract, bilateral: Secondary | ICD-10-CM | POA: Diagnosis not present

## 2019-12-30 ENCOUNTER — Other Ambulatory Visit: Payer: Self-pay | Admitting: Family Medicine

## 2020-01-02 ENCOUNTER — Ambulatory Visit (INDEPENDENT_AMBULATORY_CARE_PROVIDER_SITE_OTHER): Payer: Medicare Other

## 2020-01-02 ENCOUNTER — Other Ambulatory Visit: Payer: Self-pay

## 2020-01-02 DIAGNOSIS — Z7901 Long term (current) use of anticoagulants: Secondary | ICD-10-CM

## 2020-01-02 LAB — POCT INR: INR: 2.5 (ref 2.0–3.0)

## 2020-01-02 NOTE — Patient Instructions (Addendum)
Pre visit review using our clinic review tool, if applicable. No additional management support is needed unless otherwise documented below in the visit note.  Continue to take 1 tablet daily except 1/2 tablet on Sunday and Thursdays.  Re-check in 5 weeks per pt request.

## 2020-02-10 DIAGNOSIS — Z23 Encounter for immunization: Secondary | ICD-10-CM | POA: Diagnosis not present

## 2020-02-13 ENCOUNTER — Other Ambulatory Visit: Payer: Self-pay

## 2020-02-13 ENCOUNTER — Ambulatory Visit (INDEPENDENT_AMBULATORY_CARE_PROVIDER_SITE_OTHER): Payer: Medicare Other

## 2020-02-13 DIAGNOSIS — Z7901 Long term (current) use of anticoagulants: Secondary | ICD-10-CM | POA: Diagnosis not present

## 2020-02-13 LAB — POCT INR: INR: 2.6 (ref 2.0–3.0)

## 2020-02-13 NOTE — Patient Instructions (Addendum)
Pre visit review using our clinic review tool, if applicable. No additional management support is needed unless otherwise documented below in the visit note.  Continue to take 1 tablet daily except 1/2 tablet on Sunday and Thursdays.  Re-check in 6 weeks per pt request.

## 2020-03-26 ENCOUNTER — Ambulatory Visit (INDEPENDENT_AMBULATORY_CARE_PROVIDER_SITE_OTHER): Payer: Medicare Other

## 2020-03-26 ENCOUNTER — Other Ambulatory Visit: Payer: Self-pay

## 2020-03-26 DIAGNOSIS — Z7901 Long term (current) use of anticoagulants: Secondary | ICD-10-CM | POA: Diagnosis not present

## 2020-03-26 LAB — POCT INR: INR: 4.5 — AB (ref 2.0–3.0)

## 2020-03-26 NOTE — Patient Instructions (Addendum)
Pre visit review using our clinic review tool, if applicable. No additional management support is needed unless otherwise documented below in the visit note.  Hold dose today and decrease tomorrow to take 1/2 tablet then continue to take 1 tablet daily except 1/2 tablet on Sunday and Thursdays.  Re-check in 5 weeks

## 2020-03-28 ENCOUNTER — Other Ambulatory Visit: Payer: Self-pay | Admitting: Family Medicine

## 2020-03-30 NOTE — Telephone Encounter (Signed)
Left voice message for patient to call the office  

## 2020-03-30 NOTE — Telephone Encounter (Signed)
Please schedule MWV with nurse and CPE with Dr. Copland.  °

## 2020-04-06 NOTE — Telephone Encounter (Signed)
Left voice message to call the office  

## 2020-04-11 ENCOUNTER — Other Ambulatory Visit: Payer: Self-pay | Admitting: Family Medicine

## 2020-04-11 ENCOUNTER — Other Ambulatory Visit: Payer: Self-pay | Admitting: Cardiovascular Disease

## 2020-04-23 NOTE — Telephone Encounter (Signed)
Spoke with patient scheduled MWV with nurse and CPE 

## 2020-04-29 ENCOUNTER — Other Ambulatory Visit: Payer: Self-pay

## 2020-04-30 ENCOUNTER — Ambulatory Visit (INDEPENDENT_AMBULATORY_CARE_PROVIDER_SITE_OTHER): Payer: Medicare Other

## 2020-04-30 ENCOUNTER — Other Ambulatory Visit (INDEPENDENT_AMBULATORY_CARE_PROVIDER_SITE_OTHER): Payer: Medicare Other

## 2020-04-30 DIAGNOSIS — Z7901 Long term (current) use of anticoagulants: Secondary | ICD-10-CM | POA: Diagnosis not present

## 2020-04-30 DIAGNOSIS — E038 Other specified hypothyroidism: Secondary | ICD-10-CM | POA: Diagnosis not present

## 2020-04-30 DIAGNOSIS — N401 Enlarged prostate with lower urinary tract symptoms: Secondary | ICD-10-CM

## 2020-04-30 DIAGNOSIS — E7800 Pure hypercholesterolemia, unspecified: Secondary | ICD-10-CM | POA: Diagnosis not present

## 2020-04-30 DIAGNOSIS — R7309 Other abnormal glucose: Secondary | ICD-10-CM | POA: Diagnosis not present

## 2020-04-30 DIAGNOSIS — Z125 Encounter for screening for malignant neoplasm of prostate: Secondary | ICD-10-CM

## 2020-04-30 DIAGNOSIS — Z79899 Other long term (current) drug therapy: Secondary | ICD-10-CM | POA: Diagnosis not present

## 2020-04-30 DIAGNOSIS — Z131 Encounter for screening for diabetes mellitus: Secondary | ICD-10-CM

## 2020-04-30 LAB — CBC WITH DIFFERENTIAL/PLATELET
Basophils Absolute: 0.1 10*3/uL (ref 0.0–0.1)
Basophils Relative: 0.9 % (ref 0.0–3.0)
Eosinophils Absolute: 0.3 10*3/uL (ref 0.0–0.7)
Eosinophils Relative: 4.9 % (ref 0.0–5.0)
HCT: 47.7 % (ref 39.0–52.0)
Hemoglobin: 15.8 g/dL (ref 13.0–17.0)
Lymphocytes Relative: 34.4 % (ref 12.0–46.0)
Lymphs Abs: 2.1 10*3/uL (ref 0.7–4.0)
MCHC: 33.2 g/dL (ref 30.0–36.0)
MCV: 89.8 fl (ref 78.0–100.0)
Monocytes Absolute: 0.6 10*3/uL (ref 0.1–1.0)
Monocytes Relative: 9.3 % (ref 3.0–12.0)
Neutro Abs: 3.1 10*3/uL (ref 1.4–7.7)
Neutrophils Relative %: 50.5 % (ref 43.0–77.0)
Platelets: 185 10*3/uL (ref 150.0–400.0)
RBC: 5.32 Mil/uL (ref 4.22–5.81)
RDW: 14.4 % (ref 11.5–15.5)
WBC: 6.1 10*3/uL (ref 4.0–10.5)

## 2020-04-30 LAB — T3, FREE: T3, Free: 2.7 pg/mL (ref 2.3–4.2)

## 2020-04-30 LAB — HEPATIC FUNCTION PANEL
ALT: 12 U/L (ref 0–53)
AST: 15 U/L (ref 0–37)
Albumin: 4.5 g/dL (ref 3.5–5.2)
Alkaline Phosphatase: 85 U/L (ref 39–117)
Bilirubin, Direct: 0.1 mg/dL (ref 0.0–0.3)
Total Bilirubin: 0.5 mg/dL (ref 0.2–1.2)
Total Protein: 6.7 g/dL (ref 6.0–8.3)

## 2020-04-30 LAB — BASIC METABOLIC PANEL
BUN: 24 mg/dL — ABNORMAL HIGH (ref 6–23)
CO2: 29 mEq/L (ref 19–32)
Calcium: 9.4 mg/dL (ref 8.4–10.5)
Chloride: 105 mEq/L (ref 96–112)
Creatinine, Ser: 1.45 mg/dL (ref 0.40–1.50)
GFR: 46.03 mL/min — ABNORMAL LOW (ref >60.00)
Glucose, Bld: 93 mg/dL (ref 70–99)
Potassium: 5.3 mEq/L — ABNORMAL HIGH (ref 3.5–5.1)
Sodium: 140 mEq/L (ref 135–145)

## 2020-04-30 LAB — LIPID PANEL
Cholesterol: 131 mg/dL (ref 0–200)
HDL: 61.9 mg/dL (ref >39.00)
LDL Cholesterol: 50 mg/dL (ref 0–99)
NonHDL: 68.69
Total CHOL/HDL Ratio: 2
Triglycerides: 92 mg/dL (ref 0.0–149.0)
VLDL: 18.4 mg/dL (ref 0.0–40.0)

## 2020-04-30 LAB — T4, FREE: Free T4: 1.5 ng/dL (ref 0.60–1.60)

## 2020-04-30 LAB — POCT INR: INR: 3.9 — AB (ref 2.0–3.0)

## 2020-04-30 LAB — HEMOGLOBIN A1C: Hgb A1c MFr Bld: 5.3 % (ref 4.6–6.5)

## 2020-04-30 LAB — TSH: TSH: 1.88 u[IU]/mL (ref 0.35–4.50)

## 2020-04-30 LAB — PSA, MEDICARE: PSA: 3.35 ng/ml (ref 0.10–4.00)

## 2020-04-30 NOTE — Patient Instructions (Addendum)
Pre visit review using our clinic review tool, if applicable. No additional management support is needed unless otherwise documented below in the visit note.  Hold dose today and then change weekly dose to take 1 tablet daily except take 1/2 tablet on Sunday, Tuesday and Thursdays.  Re-check in 4 weeks.

## 2020-05-07 ENCOUNTER — Other Ambulatory Visit: Payer: Self-pay

## 2020-05-07 ENCOUNTER — Encounter: Payer: Self-pay | Admitting: Family Medicine

## 2020-05-07 ENCOUNTER — Ambulatory Visit (INDEPENDENT_AMBULATORY_CARE_PROVIDER_SITE_OTHER): Payer: Medicare Other | Admitting: Family Medicine

## 2020-05-07 VITALS — HR 78 | Temp 98.5°F | Ht 68.5 in | Wt 174.8 lb

## 2020-05-07 DIAGNOSIS — Z0000 Encounter for general adult medical examination without abnormal findings: Secondary | ICD-10-CM

## 2020-05-07 NOTE — Progress Notes (Unsigned)
[name] T. Cristina Ceniceros, MD, [name] at [facility] [name] [name], [address]  Phone: [phone]  FAX: [phone]  [name] - 80 y.o. male  MRN [id number]  Date of Birth: January 07, 1941  Date: 05/07/2020  PCP: Owens Loffler, MD  Referral: Owens Loffler, MD  Chief Complaint  Patient presents with  . Medicare Wellness    This visit occurred during the SARS-CoV-2 public health emergency.  Safety protocols were in place, including screening questions prior to the visit, additional usage of staff PPE, and extensive cleaning of exam room while observing appropriate contact time as indicated for disinfecting solutions.   Patient Care Team: Owens Loffler, MD as PCP - General (Family Medicine) Rockey Situ Kathlene November, MD as Consulting Physician (Cardiology) Owens Loffler, MD (Family Medicine) Subjective:   [name] is a 80 y.o. pleasant patient who presents for a medicare wellness examination:  Preventative Health Maintenance Visit:  Health Maintenance Summary Reviewed and updated, unless pt declines services.  Tobacco History Reviewed. Alcohol: No concerns, no excessive use Exercise Habits: trying to walk 30 mins 3-4 times a week STD concerns: no risk or activity to increase risk Drug Use: None  The last six months will feel tired some and a little bit worn out.   Feels tired and running on empty all the time.   Health Maintenance  Topic Date Due  . Hepatitis C Screening  Never done  . INFLUENZA VACCINE  07/23/2020 (Originally 11/24/2019)  . TETANUS/TDAP  09/06/2026 (Originally 09/06/2016)  . COVID-19 Vaccine  Completed  . PNA vac Low Risk Adult  Completed    Immunization History  Administered Date(s) Administered  . PFIZER SARS-COV-2 Vaccination 05/01/2019, 05/22/2019, 02/10/2020  . Pneumococcal Conjugate-13 02/20/2014  . Pneumococcal Polysaccharide-23 04/11/2008  .  Td 09/07/2006    Patient Active Problem List   Diagnosis Date Noted  . CKD (chronic kidney disease) stage 3, GFR 30-59 ml/min (HCC) 05/01/2013    Priority: High  . Intraventricular hemorrhage ([place]) 07/12/2012    Priority: High  . Ischemic cardiomyopathy 07/08/2011    Priority: High  . CAD (coronary artery disease), native coronary artery 07/07/2011    Priority: High  . COPD (chronic obstructive pulmonary disease) ([place]) 01/08/2010    Priority: High  . Bilateral renal artery stenosis (HCC)     Priority: Medium  . HTN (hypertension) 07/15/2012    Priority: Medium  . Peripheral arterial disease ([place])     Priority: Medium  . Hypothyroidism 09/07/2006    Priority: Medium  . Long term (current) use of anticoagulants 01/27/2017  . DVT (deep venous thrombosis) ([place]) 12/29/2016  . Paroxysmal a-fib: briefly in ED now in NSR 05/24/2013  . Aortic calcification ([place]) 05/01/2013  . Subclavian arterial stenosis ([place]) 04/25/2011  . BPH (benign prostatic hyperplasia) 12/29/2010  . Abdominal aortic aneurysm ([place]) 11/06/2009  . HYPERCHOLESTEROLEMIA 10/09/2009  . TOBACCO ABUSE 04/11/2008    Past Medical History:  Diagnosis Date  . AAA (abdominal aortic aneurysm) ([place])   . Aortic calcification ([place]) 05/01/2013  . Bilateral renal artery stenosis ([place])    a. 06/2013 Angio/PTA: LRA: 95 (5x18 Herculink stent), RRA 60ost, celiac/SMA nl, RCIA patent stents extending into dist Ao, RIIA 70ost, REIA min irregs, LCIA patent stent, LIIA 60-70ost, LEIA min irregs.  . CHF (congestive heart failure) ([place])    Patient states he doesn't have CHF anymore per cardiologist.  . Coronary artery disease   . Hypertension   .  Hypothyroidism   . Intraventricular hemorrhage ([place]) 07/12/2012  . Peripheral arterial disease ([place])    a. Previous left lower extremity stenting by Dr. Jamal Collin;  b. 12/2012 s/p bilat ostial common iliac stenting.  . Right leg DVT ([place]) 08/13/2012  . Subclavian artery stenosis, left ([place]) 05/2012    Status post stenting of the ostium and self-expanding stent placement to the left axillary artery  . Tobacco abuse    d/c 2012  . Venous insufficiency     Past Surgical History:  Procedure Laterality Date  . ABDOMINAL ANGIOGRAM  07/10/2013   WITH BI-FEMORAL RUNOFF       DR ARIDA  . ABDOMINAL AORTAGRAM N/A 12/26/2012   Procedure: ABDOMINAL Maxcine Ham;  Surgeon: Wellington Hampshire, MD;  Location: [place] CATH LAB;  Service: Cardiovascular;  Laterality: N/A;  . ABDOMINAL AORTAGRAM N/A 07/10/2013   Procedure: ABDOMINAL Maxcine Ham;  Surgeon: Wellington Hampshire, MD;  Location: [place] CATH LAB;  Service: Cardiovascular;  Laterality: N/A;  . ANGIOPLASTY / STENTING FEMORAL  05/2012  . ANGIOPLASTY / STENTING ILIAC Bilateral 12/26/2012  . ARCH AORTOGRAM N/A 06/20/2012   Procedure: ARCH AORTOGRAM;  Surgeon: Wellington Hampshire, MD;  Location: [place] CATH LAB;  Service: Cardiovascular;  Laterality: N/A;  . CARDIAC CATHETERIZATION  2/14   [facility]  . CHOLECYSTECTOMY  1990's  . CORONARY ARTERY BYPASS GRAFT  07/11/2011   Procedure: CORONARY ARTERY BYPASS GRAFTING (CABG);  Surgeon: Grace Isaac, MD;  Location: [place];  Service: Open Heart Surgery;  Laterality: N/A;  Times 3. On Pump. Using right greater saphenous vein and left internal mammary artery.   Marland Kitchen HERNIA REPAIR    . INSERTION OF ILIAC STENT Bilateral 12/26/2012   Procedure: INSERTION OF ILIAC STENT;  Surgeon: Wellington Hampshire, MD;  Location: [place] CATH LAB;  Service: Cardiovascular;  Laterality: Bilateral;  Bilateral Common Iliac Artery  . LEFT AND RIGHT HEART CATHETERIZATION WITH CORONARY ANGIOGRAM  07/07/2011   Procedure: LEFT AND RIGHT HEART CATHETERIZATION WITH CORONARY ANGIOGRAM;  Surgeon: Minna Merritts, MD;  Location: [place] CATH LAB;  Service: Cardiovascular;;  . LOWER EXTREMITY VENOGRAPHY Bilateral 12/30/2016   Procedure: Lower Extremity Venography;  Surgeon: Katha Cabal, MD;  Location: [place] CV LAB;  Service: Cardiovascular;  Laterality: Bilateral;  .  PERCUTANEOUS STENT INTERVENTION  06/20/2012   Procedure: PERCUTANEOUS STENT INTERVENTION;  Surgeon: Wellington Hampshire, MD;  Location: [place] CATH LAB;  Service: Cardiovascular;;  . RENAL ARTERY ANGIOPLASTY Left 07/10/2013   DR ARIDA  . SUBCLAVIAN ARTERY STENT  05/2012   "2 stents" (12/26/2012)  . TOOTH EXTRACTION  Spring 2017   mulitple (6)  . VENA CAVA FILTER PLACEMENT  07/2012   Removable    Family History  Problem Relation Age of Onset  . Alcohol abuse Father   . Cirrhosis Father   . Hypothyroidism Sister     Past Medical History, Surgical History, Social History, Family History, Problem List, Medications, and Allergies have been reviewed and updated if relevant.  Review of Systems: Pertinent positives are listed above.  Otherwise, a full 14 point review of systems has been done in full and it is negative except where it is noted positive.  Objective:   Pulse 78   Temp 98.5 F (36.9 C) (Temporal)   Ht 5' 8.5" (1.74 m)   Wt 174 lb 12 oz (79.3 kg)   SpO2 97%   BMI 26.18 kg/m  Fall Risk  05/07/2020 01/09/2019 11/27/2017 10/02/2017 09/08/2016  Falls in the past year? 0 0 No  No No  Comment - - Emmi Telephone Survey: data to providers prior to load - -  Number falls in past yr: - 0 - - -  Risk for fall due to : - Medication side effect - - -  Follow up - Falls evaluation completed;Falls prevention discussed - - -   Ideal Body Weight: Weight in (lb) to have BMI = 25: 166.5  Hearing Screening   Method: Audiometry   125Hz  250Hz  500Hz  1000Hz  2000Hz  3000Hz  4000Hz  6000Hz  8000Hz   Right ear:   25 25 20  25     Left ear:   25 20 20  25     Vision Screening Comments: Wears Glasses-Eye Exam at [employer] 11/2019 Depression screen [facility] 2/9 05/07/2020 01/09/2019 10/02/2017 09/08/2016 08/06/2014  Decreased Interest 0 0 0 0 0  Down, Depressed, Hopeless 0 0 0 0 0  PHQ - 2 Score 0 0 0 0 0  Altered sleeping - 0 - - -  Tired, decreased energy - 0 - - -  Change in appetite - 0 - - -  Feeling bad or failure  about yourself  - 0 - - -  Trouble concentrating - 0 - - -  Moving slowly or fidgety/restless - 0 - - -  Suicidal thoughts - 0 - - -  PHQ-9 Score - 0 - - -  Difficult doing work/chores - Not difficult at all - - -  Some recent data might be hidden     GEN: well developed, well nourished, no acute distress Eyes: conjunctiva and lids normal, PERRLA, EOMI ENT: TM clear, nares clear, oral exam WNL Neck: supple, no lymphadenopathy, no thyromegaly, no JVD Pulm: clear to auscultation and percussion, respiratory effort normal CV: regular rate and rhythm, S1-S2, no murmur, rub or gallop, no bruits, peripheral pulses normal and symmetric, no cyanosis, clubbing, edema or varicosities GI: soft, non-tender; no hepatosplenomegaly, masses; active bowel sounds all quadrants GU: deferred Lymph: no cervical, axillary or inguinal adenopathy MSK: gait normal, muscle tone and strength WNL, no joint swelling, effusions, discoloration, crepitus  SKIN: clear, good turgor, color WNL, no rashes, lesions, or ulcerations Neuro: normal mental status, normal strength, sensation, and motion Psych: alert; oriented to person, place and time, normally interactive and not anxious or depressed in appearance.  All labs reviewed with patient.  Results for orders placed or performed in visit on 04/30/20  POCT INR  Result Value Ref Range   INR 3.9 (A) 2.0 - 3.0    Assessment and Plan:     ICD-10-CM   1. Healthcare maintenance  Z00.00    Given all of his multiple medical problems in the past, he is doing well.  Generally, he does have some less energy over time, but I think that this is mostly age related.  No SOB, No SOB with exercise. Lungs are clear, and he has good Cardiology f/u.  Health Maintenance Exam: The patient's preventative maintenance and recommended screening tests for an annual wellness exam were reviewed in full today. Brought up to date unless services declined.  Counselled on the importance of  diet, exercise, and its role in overall health and mortality. The patient's FH and SH was reviewed, including their home life, tobacco status, and drug and alcohol status.  Follow-up in 1 year for physical exam or additional follow-up below.  I have personally reviewed the Medicare Annual Wellness questionnaire and have noted 1. The patient's medical and social history 2. Their use of alcohol, tobacco or illicit drugs 3. Their  current medications and supplements 4. The patient's functional ability including ADL's, fall risks, home safety risks and hearing or visual             impairment. 5. Diet and physical activities 6. Evidence for depression or mood disorders 7. Reviewed Updated provider list, see scanned forms and [facility] Snapshot.  8. Reviewed whether or not the patient has HCPOA or living will, and discussed what this means with the patient.  Recommended he bring in a copy for his chart in [facility].  The patients weight, height, BMI and visual acuity have been recorded in the chart I have made referrals, counseling and provided education to the patient based review of the above and I have provided the pt with a written personalized care plan for preventive services.  I have provided the patient with a copy of your personalized plan for preventive services. Instructed to take the time to review along with their updated medication list.  Follow-up: No follow-ups on file. Or follow-up in 1 year if not noted.  Future Appointments  Date Time Provider [place]  05/26/2020  1:40 PM Wellington Hampshire, MD [facility]  05/28/2020  9:30 AM [facility]    No orders of the defined types were placed in this encounter.  There are no discontinued medications. No orders of the defined types were placed in this encounter.   Signed,  Maud Deed. Gaege Sangalang, MD   Allergies as of 05/07/2020      Reactions   Plavix [clopidogrel Bisulfate]    Brain hemorrhage  prev while on plavix and aspirin      Medication List       Accurate as of May 07, 2020 11:59 PM. If you have any questions, ask your nurse or doctor.        amLODipine 5 MG tablet Commonly known as: NORVASC Take 1 tablet (5 mg total) by mouth daily.   atorvastatin 20 MG tablet Commonly known as: LIPITOR TAKE 1 TABLET BY MOUTH DAILY AT 6PM   carvedilol 6.25 MG tablet Commonly known as: COREG TAKE 1 TABLET BY MOUTH TWICE A DAY WITH MEALS   levothyroxine 150 MCG tablet Commonly known as: SYNTHROID TAKE ONE TABLET BY MOUTH EVERY DAY BEFORE BREAKFAST   warfarin 5 MG tablet Commonly known as: COUMADIN Take as directed by the anticoagulation clinic. If you are unsure how to take this medication, talk to your nurse or doctor. Original instructions: TAKE ONE TABLET BY MOUTH EVERY DAY OR AS DIRECTED BY COUMADIN CLINIC

## 2020-05-08 ENCOUNTER — Encounter: Payer: Self-pay | Admitting: Family Medicine

## 2020-05-26 ENCOUNTER — Encounter: Payer: Self-pay | Admitting: Cardiovascular Disease

## 2020-05-26 ENCOUNTER — Other Ambulatory Visit: Payer: Self-pay

## 2020-05-26 ENCOUNTER — Ambulatory Visit (INDEPENDENT_AMBULATORY_CARE_PROVIDER_SITE_OTHER): Payer: Medicare Other | Admitting: Cardiovascular Disease

## 2020-05-26 VITALS — BP 144/68 | HR 73 | Ht 69.0 in | Wt 178.1 lb

## 2020-05-26 DIAGNOSIS — I6523 Occlusion and stenosis of bilateral carotid arteries: Secondary | ICD-10-CM | POA: Diagnosis not present

## 2020-05-26 DIAGNOSIS — I10 Essential (primary) hypertension: Secondary | ICD-10-CM

## 2020-05-26 DIAGNOSIS — I771 Stricture of artery: Secondary | ICD-10-CM

## 2020-05-26 DIAGNOSIS — I701 Atherosclerosis of renal artery: Secondary | ICD-10-CM

## 2020-05-26 DIAGNOSIS — I2510 Atherosclerotic heart disease of native coronary artery without angina pectoris: Secondary | ICD-10-CM | POA: Diagnosis not present

## 2020-05-26 DIAGNOSIS — E785 Hyperlipidemia, unspecified: Secondary | ICD-10-CM | POA: Diagnosis not present

## 2020-05-26 DIAGNOSIS — I714 Abdominal aortic aneurysm, without rupture, unspecified: Secondary | ICD-10-CM

## 2020-05-26 DIAGNOSIS — I739 Peripheral vascular disease, unspecified: Secondary | ICD-10-CM | POA: Diagnosis not present

## 2020-05-26 DIAGNOSIS — Z86718 Personal history of other venous thrombosis and embolism: Secondary | ICD-10-CM | POA: Diagnosis not present

## 2020-05-26 NOTE — Progress Notes (Signed)
Cardiology Office Note   Date:  05/26/2020   ID:  [name], DOB [birth year], MRN [id number]  PCP:  [name], MD  Cardiologist:   Lorine Bears, MD   Chief Complaint  Patient presents with  . 6 month follow up     "doing well." Medications reviewed by the patient verbally.       History of Present Illness: [name] is a 80 y.o. male who presents for a followup visit regarding extensive peripheral arterial disease with previous stenting of the left subclavian artery and left axillary artery, bilateral common iliac artery stenting and  left renal artery stenting. The patient has multiple medical problems including hyperlipidemia, Small AAA, long smoking history for 60 years, COPD , moderate carotid disease, and atherosclerotic coronary artery disease status post CABG in March of 2013 with LIMA to the LAD, vein graft to the PDA, vein graft to the OM. He does have mild aortic valve stenosis.  He had a small intracranial hemorrhage in 2014 which was suspected to be due to malignant hypertension and dual antiplatelet therapy. Shortly after that he was diagnosed with extensive right-sided DVT. Anticoagulation was contraindicated and thus an IVC filter was placed.   He had extensive bilateral occlusive DVT in 2018 which required catheter-based therapy.  He has been on anticoagulation since then.   He has been doing reasonably well with no chest pain or worsening dyspnea.  His biggest issue seems to be bilateral leg weakness with walking.  He also has some mild discomfort.  He takes his medications regularly.   Past Medical History:  Diagnosis Date  . AAA (abdominal aortic aneurysm) (HCC)   . Aortic calcification (HCC) 05/01/2013  . Bilateral renal artery stenosis (HCC)    a. 06/2013 Angio/PTA: LRA: 95 (5x18 Herculink stent), RRA 60ost, celiac/SMA nl, RCIA patent stents extending into dist Ao, RIIA 70ost, REIA min irregs, LCIA patent stent, LIIA 60-70ost, LEIA min irregs.  .  CHF (congestive heart failure) (HCC)    Patient states he doesn't have CHF anymore per cardiologist.  . Coronary artery disease   . History of prior cigarette smoking 04/11/2008   Qualifier: Diagnosis of  By: Ermalene Searing MD, Amy    . Hypertension   . Hypothyroidism   . Intraventricular hemorrhage (HCC) 07/12/2012  . Peripheral arterial disease (HCC)    a. Previous left lower extremity stenting by Dr. Evette Cristal;  b. 12/2012 s/p bilat ostial common iliac stenting.  . Right leg DVT (HCC) 08/13/2012  . Subclavian artery stenosis, left (HCC) 05/2012   Status post stenting of the ostium and self-expanding stent placement to the left axillary artery  . Venous insufficiency     Past Surgical History:  Procedure Laterality Date  . ABDOMINAL ANGIOGRAM  07/10/2013   WITH BI-FEMORAL RUNOFF       DR Deliliah Spranger  . ABDOMINAL AORTAGRAM N/A 12/26/2012   Procedure: ABDOMINAL Ronny Flurry;  Surgeon: [name], MD;  [facility];  Service: Cardiovascular;  Laterality: N/A;  . ABDOMINAL AORTAGRAM N/A 07/10/2013   Procedure: ABDOMINAL Ronny Flurry;  Surgeon: [name], MD;  Location: [facility] C[facility];  Service: Cardiovascular;  Laterality: N/A;  . ANGIOPLASTY / STENTING FEMORAL  05/2012  . ANGIOPLASTY / STENTING ILIAC Bilateral 12/26/2012  . ARCH AORTOGRAM N/A 06/20/2012   Procedure: ARCH AORTOGRAM;  Surgeon: [name], MD;  [facility]: [facility];  Service: Cardiovascular;  Laterality: N/A;  . CARDIAC CATHETERIZATION  2/14   [facility]  . CHOLECYSTECTOMY  1990's  . CORONARY ARTERY BYPASS GRAFT  07/11/2011   Procedure: CORONARY ARTERY BYPASS GRAFTING (CABG);  Surgeon: Grace Isaac, MD;  Location: [place];  Service: Open Heart Surgery;  Laterality: N/A;  Times 3. On Pump. Using right greater saphenous vein and left internal mammary artery.   Marland Kitchen HERNIA REPAIR    . INSERTION OF ILIAC STENT Bilateral 12/26/2012   Procedure: INSERTION OF ILIAC STENT;  Surgeon: Wellington Hampshire, MD;  Location: [place] CATH LAB;  Service:  Cardiovascular;  Laterality: Bilateral;  Bilateral Common Iliac Artery  . LEFT AND RIGHT HEART CATHETERIZATION WITH CORONARY ANGIOGRAM  07/07/2011   Procedure: LEFT AND RIGHT HEART CATHETERIZATION WITH CORONARY ANGIOGRAM;  Surgeon: Minna Merritts, MD;  Location: [place] CATH LAB;  Service: Cardiovascular;;  . LOWER EXTREMITY VENOGRAPHY Bilateral 12/30/2016   Procedure: Lower Extremity Venography;  Surgeon: Katha Cabal, MD;  Location: [place] CV LAB;  Service: Cardiovascular;  Laterality: Bilateral;  . PERCUTANEOUS STENT INTERVENTION  06/20/2012   Procedure: PERCUTANEOUS STENT INTERVENTION;  Surgeon: Wellington Hampshire, MD;  Location: [place] CATH LAB;  Service: Cardiovascular;;  . RENAL ARTERY ANGIOPLASTY Left 07/10/2013   DR Alejandria Wessells  . SUBCLAVIAN ARTERY STENT  05/2012   "2 stents" (12/26/2012)  . TOOTH EXTRACTION  Spring 2017   mulitple (6)  . VENA CAVA FILTER PLACEMENT  07/2012   Removable     Current Outpatient Medications  Medication Sig Dispense Refill  . amLODipine (NORVASC) 5 MG tablet Take 1 tablet (5 mg total) by mouth daily. 90 tablet 2  . atorvastatin (LIPITOR) 20 MG tablet TAKE 1 TABLET BY MOUTH DAILY AT 6PM 90 tablet 0  . carvedilol (COREG) 6.25 MG tablet TAKE 1 TABLET BY MOUTH TWICE A DAY WITH MEALS 180 tablet 0  . levothyroxine (SYNTHROID) 150 MCG tablet TAKE ONE TABLET BY MOUTH EVERY DAY BEFORE BREAKFAST 90 tablet 0  . warfarin (COUMADIN) 5 MG tablet TAKE ONE TABLET BY MOUTH EVERY DAY OR AS DIRECTED BY COUMADIN CLINIC 90 tablet 1   No current facility-administered medications for this visit.    Allergies:   Plavix [clopidogrel bisulfate]    Social History:  The patient  reports that he quit smoking about 9 years ago. His smoking use included cigarettes. He has a 50.00 pack-year smoking history. He has never used smokeless tobacco. He reports that he does not drink alcohol and does not use drugs.   Family History:  The patient's family history includes Alcohol abuse in his  father; Cirrhosis in his father; Hypothyroidism in his sister.    ROS:  Please see the history of present illness.   Otherwise, review of systems are positive for none.   All other systems are reviewed and negative.    PHYSICAL EXAM: VS:  BP (!) 144/68 (BP Location: Right Arm, Patient Position: Sitting, Cuff Size: Normal)   Pulse 73   Ht 5\' 9"  (1.753 m)   Wt 178 lb 2 oz (80.8 kg)   SpO2 98%   BMI 26.30 kg/m  , BMI Body mass index is 26.3 kg/m. GEN: Well nourished, well developed, in no acute distress  HEENT: normal  Neck: no JVD or masses. There is left carotid bruit and a bruit in the left subclavian artery area. Cardiac: RRR; no , rubs, or gallops,no edema . 2/6 systolic ejection murmur in the aortic area Respiratory:  clear to auscultation bilaterally, normal work of breathing GI: soft, nontender, nondistended, + BS MS: no deformity or atrophy  Skin: warm and  dry, no rash Neuro:  Strength and sensation are intact Psych: euthymic mood, full affect Vascular:  Radial pulses normal on the right side and slightly diminished on the left side.   EKG:  EKG is ordered today. The ekg ordered today demonstrates normal sinus rhythm with incomplete right bundle branch block.   Recent Labs: 04/30/2020: ALT 12; BUN 24; Creatinine, Ser 1.45; Hemoglobin 15.8; Platelets 185.0; Potassium 5.3 No hemolysis seen; Sodium 140; TSH 1.88    Lipid Panel    Component Value Date/Time   CHOL 131 04/30/2020 1157   CHOL 125 01/31/2014 0756   TRIG 92.0 04/30/2020 1157   HDL 61.90 04/30/2020 1157   HDL 63 01/31/2014 0756   CHOLHDL 2 04/30/2020 1157   VLDL 18.4 04/30/2020 1157   LDLCALC 50 04/30/2020 1157   LDLCALC 46 01/31/2014 0756      Wt Readings from Last 3 Encounters:  05/26/20 178 lb 2 oz (80.8 kg)  05/07/20 174 lb 12 oz (79.3 kg)  11/19/19 170 lb 6 oz (77.3 kg)        ASSESSMENT AND PLAN:   1. Coronary artery disease involving native coronary arteries without angina: He is doing  well overall with stable exertional dyspnea.  Continue medical therapy.  He is not on antiplatelet medication given that he is on anticoagulation.  2. Bilateral carotid artery stenosis: Carotid Doppler last year showed moderate left carotid stenosis.  I requested a follow-up study.  3.  Bilateral renal artery stenosis: Status post left renal artery stenting.  Most recent duplex did not show clear evidence of disease bilaterally although the left side was not well visualized.  4. Abdominal aortic aneurysm: This was small on most recent ultrasound .  5 . Essential hypertension: Blood pressure is more controlled since increasing the dose of amlodipine.  6. Peripheral arterial disease with previous iliac stenting.  He reports worsening bilateral leg weakness with exertion.  I requested an ABI and aortoiliac duplex.  7.  Status post left subclavian artery stenting. Left radial pulse is near normal.  8.  History of previous extensive lower extremity DVT.  Currently on lifelong anticoagulation with warfarin which is followed by his primary care physician.  9.  Hyperlipidemia: Continue treatment with atorvastatin.  I reviewed most recent lipid profile which showed an LDL of 50.   Disposition:   Follow-up with me in 6 months.  Signed,  [name], MD  05/26/2020 2:04 PM    [facility]

## 2020-05-26 NOTE — Patient Instructions (Signed)
Medication Instructions:  Your physician recommends that you continue on your current medications as directed. Please refer to the Current Medication list given to you today.  *If you need a refill on your cardiac medications before your next appointment, please call your pharmacy*   Lab Work: None ordered If you have labs (blood work) drawn today and your tests are completely normal, you will receive your results only by: Marland Kitchen MyChart Message (if you have MyChart) OR . A paper copy in the mail If you have any lab test that is abnormal or we need to change your treatment, we will call you to review the results.   Testing/Procedures: Your physician has requested that you have a carotid duplex. This test is an ultrasound of the carotid arteries in your neck. It looks at blood flow through these arteries that supply the brain with blood. Allow one hour for this exam. There are no restrictions or special instructions.  Your physician has requested that you have an ankle brachial index (ABI). During this test an ultrasound and blood pressure cuff are used to evaluate the arteries that supply the arms and legs with blood. Allow thirty minutes for this exam. There are no restrictions or special instructions.  Your physician recommends that you have a aorta/IVC/iliacs duplex   Follow-Up: At [facility], you and your health needs are our priority.  As part of our continuing mission to provide you with exceptional heart care, we have created designated Provider Care Teams.  These Care Teams include your primary Cardiologist (physician) and Advanced Practice Providers (APPs -  Physician Assistants and Nurse Practitioners) who all work together to provide you with the care you need, when you need it.  We recommend signing up for the patient portal called "MyChart".  Sign up information is provided on this After Visit Summary.  MyChart is used to connect with patients for Virtual Visits (Telemedicine).   Patients are able to view lab/test results, encounter notes, upcoming appointments, etc.  Non-urgent messages can be sent to your provider as well.   To learn more about what you can do with MyChart, go to [email or web address].    Your next appointment:   6 month(s)  The format for your next appointment:   In Person  Provider:   You may see  [name], MD or one of the following Advanced Practice Providers on your designated Care Team:    Murray Hodgkins, NP  Christell Faith, PA-C  Marrianne Mood, PA-C  Cadence Lower [place], [place]  Laurann Montana, NP    Other Instructions N/A

## 2020-05-28 ENCOUNTER — Ambulatory Visit (INDEPENDENT_AMBULATORY_CARE_PROVIDER_SITE_OTHER): Payer: Medicare Other

## 2020-05-28 ENCOUNTER — Other Ambulatory Visit: Payer: Self-pay

## 2020-05-28 DIAGNOSIS — Z7901 Long term (current) use of anticoagulants: Secondary | ICD-10-CM | POA: Diagnosis not present

## 2020-05-28 LAB — POCT INR: INR: 3.1 — AB (ref 2.0–3.0)

## 2020-05-28 NOTE — Patient Instructions (Addendum)
Pre visit review using our clinic review tool, if applicable. No additional management support is needed unless otherwise documented below in the visit note.  Pt reports there are no changes and has no idea why his INR is elevated.  Hold dose today and then change weekly dose to take 1 tablet daily except take 1/2 tablet on Sunday, Tuesday, Thursdays, and Saturdays.  Re-check in 4 weeks.

## 2020-05-29 ENCOUNTER — Ambulatory Visit: Payer: Medicare Other

## 2020-06-02 ENCOUNTER — Other Ambulatory Visit: Payer: Self-pay | Admitting: Family Medicine

## 2020-06-02 DIAGNOSIS — Z7901 Long term (current) use of anticoagulants: Secondary | ICD-10-CM

## 2020-06-03 NOTE — Telephone Encounter (Signed)
Pharmacy requests refill on: Warfarin 5 mg   LAST REFILL: 11/11/2019 (Q-90, R-1) LAST OV: 05/28/2020 NEXT OV: 06/25/2020 PHARMACY: [id number] [place], [place]

## 2020-06-08 ENCOUNTER — Other Ambulatory Visit: Payer: Self-pay | Admitting: Family Medicine

## 2020-06-08 ENCOUNTER — Other Ambulatory Visit: Payer: Self-pay | Admitting: Cardiovascular Disease

## 2020-06-22 ENCOUNTER — Ambulatory Visit (INDEPENDENT_AMBULATORY_CARE_PROVIDER_SITE_OTHER): Payer: Medicare Other

## 2020-06-22 ENCOUNTER — Other Ambulatory Visit: Payer: Self-pay

## 2020-06-22 ENCOUNTER — Other Ambulatory Visit: Payer: Self-pay | Admitting: Cardiovascular Disease

## 2020-06-22 ENCOUNTER — Other Ambulatory Visit: Payer: Self-pay | Admitting: Psychiatry

## 2020-06-22 DIAGNOSIS — I6523 Occlusion and stenosis of bilateral carotid arteries: Secondary | ICD-10-CM

## 2020-06-22 DIAGNOSIS — Z95828 Presence of other vascular implants and grafts: Secondary | ICD-10-CM

## 2020-06-22 DIAGNOSIS — I739 Peripheral vascular disease, unspecified: Secondary | ICD-10-CM

## 2020-06-22 DIAGNOSIS — I714 Abdominal aortic aneurysm, without rupture, unspecified: Secondary | ICD-10-CM

## 2020-06-22 DIAGNOSIS — I701 Atherosclerosis of renal artery: Secondary | ICD-10-CM

## 2020-06-22 DIAGNOSIS — I771 Stricture of artery: Secondary | ICD-10-CM

## 2020-06-25 ENCOUNTER — Ambulatory Visit (INDEPENDENT_AMBULATORY_CARE_PROVIDER_SITE_OTHER): Payer: Medicare Other

## 2020-06-25 ENCOUNTER — Other Ambulatory Visit: Payer: Self-pay

## 2020-06-25 DIAGNOSIS — Z7901 Long term (current) use of anticoagulants: Secondary | ICD-10-CM | POA: Diagnosis not present

## 2020-06-25 LAB — POCT INR: INR: 2.3 (ref 2.0–3.0)

## 2020-06-25 NOTE — Patient Instructions (Addendum)
Pre visit review using our clinic review tool, if applicable. No additional management support is needed unless otherwise documented below in the visit note.  Continue weekly dose to take 1 tablet daily except take 1/2 tablet on Sunday, Tuesday, Thursdays, and Saturdays.  Re-check in 5 weeks.

## 2020-07-06 ENCOUNTER — Other Ambulatory Visit: Payer: Self-pay | Admitting: Family Medicine

## 2020-07-06 ENCOUNTER — Other Ambulatory Visit: Payer: Self-pay | Admitting: Cardiovascular Disease

## 2020-07-30 ENCOUNTER — Ambulatory Visit (INDEPENDENT_AMBULATORY_CARE_PROVIDER_SITE_OTHER): Payer: Medicare Other

## 2020-07-30 ENCOUNTER — Other Ambulatory Visit: Payer: Self-pay

## 2020-07-30 DIAGNOSIS — Z7901 Long term (current) use of anticoagulants: Secondary | ICD-10-CM

## 2020-07-30 LAB — POCT INR: INR: 2.7 (ref 2.0–3.0)

## 2020-07-30 NOTE — Patient Instructions (Addendum)
Pre visit review using our clinic review tool, if applicable. No additional management support is needed unless otherwise documented below in the visit note.  Continue weekly dose to take 1 tablet daily except take 1/2 tablet on Sunday, Tuesday, Thursdays, and Saturdays.  Re-check in 5 weeks per patient request.

## 2020-09-03 ENCOUNTER — Other Ambulatory Visit: Payer: Self-pay

## 2020-09-03 ENCOUNTER — Ambulatory Visit (INDEPENDENT_AMBULATORY_CARE_PROVIDER_SITE_OTHER): Payer: Medicare Other

## 2020-09-03 DIAGNOSIS — Z7901 Long term (current) use of anticoagulants: Secondary | ICD-10-CM | POA: Diagnosis not present

## 2020-09-03 LAB — POCT INR: INR: 3 (ref 2.0–3.0)

## 2020-09-03 NOTE — Patient Instructions (Addendum)
Pre visit review using our clinic review tool, if applicable. No additional management support is needed unless otherwise documented below in the visit note.  Continue weekly dose to take 1 tablet daily except take 1/2 tablet on Sunday, Tuesday, Thursdays, and Saturdays.  Re-check in 5 weeks per patient request.

## 2020-09-11 ENCOUNTER — Other Ambulatory Visit: Payer: Self-pay | Admitting: Family Medicine

## 2020-10-08 ENCOUNTER — Other Ambulatory Visit: Payer: Self-pay

## 2020-10-08 ENCOUNTER — Ambulatory Visit (INDEPENDENT_AMBULATORY_CARE_PROVIDER_SITE_OTHER): Payer: Medicare Other

## 2020-10-08 DIAGNOSIS — Z7901 Long term (current) use of anticoagulants: Secondary | ICD-10-CM

## 2020-10-08 LAB — POCT INR: INR: 2.5 (ref 2.0–3.0)

## 2020-10-08 NOTE — Patient Instructions (Addendum)
Pre visit review using our clinic review tool, if applicable. No additional management support is needed unless otherwise documented below in the visit note.  Continue weekly dose to take 1 tablet daily except take 1/2 tablet on Sunday, Tuesday, Thursdays, and Saturdays.  Re-check in 5 weeks

## 2020-10-23 ENCOUNTER — Other Ambulatory Visit: Payer: Self-pay | Admitting: Cardiovascular Disease

## 2020-11-12 ENCOUNTER — Ambulatory Visit (INDEPENDENT_AMBULATORY_CARE_PROVIDER_SITE_OTHER): Payer: Medicare Other

## 2020-11-12 ENCOUNTER — Other Ambulatory Visit: Payer: Self-pay

## 2020-11-12 DIAGNOSIS — Z7901 Long term (current) use of anticoagulants: Secondary | ICD-10-CM | POA: Diagnosis not present

## 2020-11-12 LAB — POCT INR: INR: 2.2 (ref 2.0–3.0)

## 2020-11-12 NOTE — Patient Instructions (Addendum)
Pre visit review using our clinic review tool, if applicable. No additional management support is needed unless otherwise documented below in the visit note.  Continue weekly dose to take 1 tablet daily except take 1/2 tablet on Sunday, Tuesday, Thursdays, and Saturdays.  Re-check in 5 weeks.

## 2020-12-10 ENCOUNTER — Ambulatory Visit: Payer: Medicare Other

## 2020-12-16 ENCOUNTER — Telehealth: Payer: Self-pay | Admitting: Internal Medicine

## 2020-12-16 ENCOUNTER — Telehealth: Payer: Self-pay | Admitting: Cardiovascular Disease

## 2020-12-16 ENCOUNTER — Other Ambulatory Visit: Payer: Self-pay

## 2020-12-16 MED ORDER — ATORVASTATIN CALCIUM 20 MG PO TABS: 20.0000 mg | ORAL_TABLET | Freq: Every day | ORAL | 0 refills | Status: DC

## 2020-12-16 NOTE — Telephone Encounter (Signed)
error 

## 2020-12-16 NOTE — Telephone Encounter (Signed)
*  STAT* If patient is at the pharmacy, call can be transferred to refill team.   1. Which medications need to be refilled? (please list name of each medication and dose if known)  Atorvastatin 20 MG 1 tablet daily   2. Which pharmacy/location (including street and city if local pharmacy) is [facility] sent to[facility] [place]  3. Do they need a 30 day or 90 day supply? 90 day

## 2020-12-16 NOTE — Telephone Encounter (Signed)
atorvastatin (LIPITOR) 20 MG tablet 90 tablet 0 12/16/2020    Sig - Route: Take 1 tablet (20 mg total) by mouth daily. - Oral   Sent to pharmacy as: atorvastatin (LIPITOR) 20 MG tablet   Notes to Pharmacy: Patient will need an appt for further refills, thanks ! [id number]   E-Prescribing Status: Receipt confirmed by pharmacy (12/16/2020 11:55 AM EDT)    [facility]  [id number] [address]

## 2020-12-17 ENCOUNTER — Ambulatory Visit: Payer: Medicare Other

## 2020-12-18 ENCOUNTER — Ambulatory Visit (INDEPENDENT_AMBULATORY_CARE_PROVIDER_SITE_OTHER): Payer: Medicare Other

## 2020-12-18 ENCOUNTER — Other Ambulatory Visit: Payer: Self-pay

## 2020-12-18 DIAGNOSIS — Z7901 Long term (current) use of anticoagulants: Secondary | ICD-10-CM

## 2020-12-18 LAB — POCT INR: INR: 1.7 — AB (ref 2.0–3.0)

## 2020-12-18 NOTE — Patient Instructions (Addendum)
Pre visit review using our clinic review tool, if applicable. No additional management support is needed unless otherwise documented below in the visit note.  Increase dose today to 1 1/2 tablets and continue weekly dose to take 1 tablet daily except take 1/2 tablet on Sunday, Tuesday, Thursdays, and Saturdays.  Re-check in 5 weeks per patient request.

## 2021-01-22 ENCOUNTER — Ambulatory Visit: Payer: Medicare Other

## 2021-01-29 ENCOUNTER — Ambulatory Visit (INDEPENDENT_AMBULATORY_CARE_PROVIDER_SITE_OTHER): Payer: Medicare Other

## 2021-01-29 ENCOUNTER — Other Ambulatory Visit: Payer: Self-pay

## 2021-01-29 DIAGNOSIS — Z7901 Long term (current) use of anticoagulants: Secondary | ICD-10-CM | POA: Diagnosis not present

## 2021-01-29 LAB — POCT INR: INR: 2.8 (ref 2.0–3.0)

## 2021-01-29 NOTE — Patient Instructions (Addendum)
Pre visit review using our clinic review tool, if applicable. No additional management support is needed unless otherwise documented below in the visit note.  Continue weekly dose to take 1 tablet daily except take 1/2 tablet on Sunday, Tuesday, Thursdays, and Saturdays.  Re-check in 5 weeks

## 2021-02-02 ENCOUNTER — Other Ambulatory Visit: Payer: Self-pay

## 2021-02-02 ENCOUNTER — Ambulatory Visit (INDEPENDENT_AMBULATORY_CARE_PROVIDER_SITE_OTHER): Payer: Medicare Other | Admitting: Family Medicine

## 2021-02-02 VITALS — BP 120/60 | HR 75 | Temp 97.0°F | Ht 69.0 in | Wt 175.5 lb

## 2021-02-02 DIAGNOSIS — L309 Dermatitis, unspecified: Secondary | ICD-10-CM | POA: Insufficient documentation

## 2021-02-02 MED ORDER — TRIAMCINOLONE ACETONIDE 0.1 % EX CREA: 1.0000 | TOPICAL_CREAM | Freq: Two times a day (BID) | CUTANEOUS | 1 refills | Status: DC

## 2021-02-02 NOTE — Patient Instructions (Signed)
Rash - Triamcinolone twice daily - Cetirizine once daily - for itching  - if itching still severe - contact and can try hydroxyzine  UPDATE in 1 week if not improvement  If getting better, use ointment for up to 3-4 weeks

## 2021-02-02 NOTE — Assessment & Plan Note (Signed)
Etiology unclear. Suspect allergic vs contact. Triamcinolne cream. If no improvement will try stronger steroid. Reviewed safety of zyrtec with warfarin - ok to use if needed to help with itch.

## 2021-02-02 NOTE — Progress Notes (Signed)
   Subjective:     SKYLAN LARA is a 80 y.o. male presenting for Rash (On both forearms, both thighs x "weeks". Itching and burning)     Rash   #Rash - started August 2022 - itching and burning - started on the left forearm and thighs and has has traveled up that arm  - has also had some swelling in the left arm - has used topical otc - calamine lotion, hydrocortisone w/ some improvement but symptoms return - no known exposure - if he scratches the area does weep - has not tried allergy medication or benadryl  No hx of autoimmune disease No fever/chills  Review of Systems  Skin:  Positive for rash.    Social History   Tobacco Use  Smoking Status Former   Packs/day: 1.00   Years: 50.00   Pack years: 50.00   Types: Cigarettes   Quit date: 12/25/2010   Years since quitting: 10.1  Smokeless Tobacco Never        Objective:    BP Readings from Last 3 Encounters:  02/02/21 120/60  05/26/20 (!) 144/68  11/19/19 (!) 170/80   Wt Readings from Last 3 Encounters:  02/02/21 175 lb 8 oz (79.6 kg)  05/26/20 178 lb 2 oz (80.8 kg)  05/07/20 174 lb 12 oz (79.3 kg)    BP 120/60   Pulse 75   Temp (!) 97 F (36.1 C) (Temporal)   Ht 5\' 9"  (1.753 m)   Wt 175 lb 8 oz (79.6 kg)   SpO2 100%   BMI 25.92 kg/m    Physical Exam Constitutional:      Appearance: Normal appearance. He is not ill-appearing or diaphoretic.  HENT:     Right Ear: External ear normal.     Left Ear: External ear normal.  Eyes:     General: No scleral icterus.    Extraocular Movements: Extraocular movements intact.     Conjunctiva/sclera: Conjunctivae normal.  Cardiovascular:     Rate and Rhythm: Normal rate.  Pulmonary:     Effort: Pulmonary effort is normal.  Musculoskeletal:     Cervical back: Neck supple.  Skin:    General: Skin is warm and dry.     Comments: Erythematous plaque rash with scale on the left forearm and dorsal surface of the hand as well as scattered lesion on the b/l  thighs and right forearm. Left arm has some swelling  Neurological:     Mental Status: He is alert. Mental status is at baseline.  Psychiatric:        Mood and Affect: Mood normal.          Assessment & Plan:   Problem List Items Addressed This Visit       Musculoskeletal and Integument   Dermatitis - Primary    Etiology unclear. Suspect allergic vs contact. Triamcinolne cream. If no improvement will try stronger steroid. Reviewed safety of zyrtec with warfarin - ok to use if needed to help with itch.       Relevant Medications   triamcinolone cream (KENALOG) 0.1 %     Return if symptoms worsen or fail to improve.  Lesleigh Noe, MD  This visit occurred during the SARS-CoV-2 public health emergency.  Safety protocols were in place, including screening questions prior to the visit, additional usage of staff PPE, and extensive cleaning of exam room while observing appropriate contact time as indicated for disinfecting solutions.

## 2021-02-08 ENCOUNTER — Encounter: Payer: Self-pay | Admitting: Family Medicine

## 2021-02-10 ENCOUNTER — Encounter: Payer: Self-pay | Admitting: Family Medicine

## 2021-02-10 ENCOUNTER — Other Ambulatory Visit: Payer: Self-pay

## 2021-02-10 DIAGNOSIS — L309 Dermatitis, unspecified: Secondary | ICD-10-CM

## 2021-02-10 MED ORDER — TRIAMCINOLONE ACETONIDE 0.1 % EX CREA: 1.0000 "application " | TOPICAL_CREAM | Freq: Two times a day (BID) | CUTANEOUS | 1 refills | Status: DC

## 2021-02-12 ENCOUNTER — Other Ambulatory Visit: Payer: Self-pay

## 2021-02-12 ENCOUNTER — Emergency Department
Admission: EM | Admit: 2021-02-12 | Discharge: 2021-02-13 | Disposition: A | Discharge status: Home / Self Care | Payer: Medicare Other | Attending: Emergency Medicine | Admitting: Emergency Medicine

## 2021-02-12 ENCOUNTER — Emergency Department: Payer: Medicare Other

## 2021-02-12 ENCOUNTER — Encounter: Payer: Self-pay | Admitting: Emergency Medicine

## 2021-02-12 ENCOUNTER — Ambulatory Visit
Admission: RE | Admit: 2021-02-12 | Discharge: 2021-02-12 | Payer: Medicare Other | Source: Ambulatory Visit | Attending: Emergency Medicine | Admitting: Emergency Medicine

## 2021-02-12 ENCOUNTER — Telehealth: Payer: Self-pay

## 2021-02-12 ENCOUNTER — Ambulatory Visit: Payer: Medicare Other

## 2021-02-12 ENCOUNTER — Ambulatory Visit (INDEPENDENT_AMBULATORY_CARE_PROVIDER_SITE_OTHER): Payer: Medicare Other

## 2021-02-12 VITALS — BP 160/77 | HR 81 | Temp 97.7°F | Resp 18

## 2021-02-12 DIAGNOSIS — N183 Chronic kidney disease, stage 3 unspecified: Secondary | ICD-10-CM | POA: Diagnosis not present

## 2021-02-12 DIAGNOSIS — I2510 Atherosclerotic heart disease of native coronary artery without angina pectoris: Secondary | ICD-10-CM | POA: Diagnosis not present

## 2021-02-12 DIAGNOSIS — R0602 Shortness of breath: Secondary | ICD-10-CM

## 2021-02-12 DIAGNOSIS — Z87891 Personal history of nicotine dependence: Secondary | ICD-10-CM | POA: Insufficient documentation

## 2021-02-12 DIAGNOSIS — Z79899 Other long term (current) drug therapy: Secondary | ICD-10-CM | POA: Insufficient documentation

## 2021-02-12 DIAGNOSIS — J441 Chronic obstructive pulmonary disease with (acute) exacerbation: Secondary | ICD-10-CM | POA: Diagnosis not present

## 2021-02-12 DIAGNOSIS — R059 Cough, unspecified: Secondary | ICD-10-CM

## 2021-02-12 DIAGNOSIS — I110 Hypertensive heart disease with heart failure: Secondary | ICD-10-CM | POA: Diagnosis not present

## 2021-02-12 DIAGNOSIS — I130 Hypertensive heart and chronic kidney disease with heart failure and stage 1 through stage 4 chronic kidney disease, or unspecified chronic kidney disease: Secondary | ICD-10-CM | POA: Diagnosis not present

## 2021-02-12 DIAGNOSIS — I5033 Acute on chronic diastolic (congestive) heart failure: Secondary | ICD-10-CM | POA: Insufficient documentation

## 2021-02-12 DIAGNOSIS — E039 Hypothyroidism, unspecified: Secondary | ICD-10-CM | POA: Diagnosis not present

## 2021-02-12 DIAGNOSIS — Z20822 Contact with and (suspected) exposure to covid-19: Secondary | ICD-10-CM | POA: Diagnosis not present

## 2021-02-12 LAB — COMPREHENSIVE METABOLIC PANEL
ALT: 11 U/L (ref 0–44)
AST: 16 U/L (ref 15–41)
Albumin: 4.6 g/dL (ref 3.5–5.0)
Alkaline Phosphatase: 99 U/L (ref 38–126)
Anion gap: 11 (ref 5–15)
BUN: 20 mg/dL (ref 8–23)
CO2: 26 mmol/L (ref 22–32)
Calcium: 9.3 mg/dL (ref 8.9–10.3)
Chloride: 101 mmol/L (ref 98–111)
Creatinine, Ser: 1.1 mg/dL (ref 0.61–1.24)
GFR, Estimated: >60 mL/min (ref >60)
Glucose, Bld: 100 mg/dL — ABNORMAL HIGH (ref 70–99)
Potassium: 4.1 mmol/L (ref 3.5–5.1)
Sodium: 138 mmol/L (ref 135–145)
Total Bilirubin: 1.5 mg/dL — ABNORMAL HIGH (ref 0.3–1.2)
Total Protein: 8 g/dL (ref 6.5–8.1)

## 2021-02-12 LAB — CBC WITH DIFFERENTIAL/PLATELET
Abs Immature Granulocytes: 0.03 10*3/uL (ref 0.00–0.07)
Basophils Absolute: 0.1 10*3/uL (ref 0.0–0.1)
Basophils Relative: 1 %
Eosinophils Absolute: 0.3 10*3/uL (ref 0.0–0.5)
Eosinophils Relative: 4 %
HCT: 51 % (ref 39.0–52.0)
Hemoglobin: 16.5 g/dL (ref 13.0–17.0)
Immature Granulocytes: 0 %
Lymphocytes Relative: 29 %
Lymphs Abs: 2.2 10*3/uL (ref 0.7–4.0)
MCH: 29.3 pg (ref 26.0–34.0)
MCHC: 32.4 g/dL (ref 30.0–36.0)
MCV: 90.6 fL (ref 80.0–100.0)
Monocytes Absolute: 0.8 10*3/uL (ref 0.1–1.0)
Monocytes Relative: 10 %
Neutro Abs: 4.2 10*3/uL (ref 1.7–7.7)
Neutrophils Relative %: 56 %
Platelets: 210 10*3/uL (ref 150–400)
RBC: 5.63 MIL/uL (ref 4.22–5.81)
RDW: 14.3 % (ref 11.5–15.5)
WBC: 7.6 10*3/uL (ref 4.0–10.5)
nRBC: 0 % (ref 0.0–0.2)

## 2021-02-12 LAB — PROTIME-INR
INR: 2 — ABNORMAL HIGH (ref 0.8–1.2)
Prothrombin Time: 22.2 seconds — ABNORMAL HIGH (ref 11.4–15.2)

## 2021-02-12 LAB — TROPONIN I (HIGH SENSITIVITY)
Troponin I (High Sensitivity): 39 ng/L — ABNORMAL HIGH (ref <18)
Troponin I (High Sensitivity): 38 ng/L — ABNORMAL HIGH (ref <18)

## 2021-02-12 LAB — RESP PANEL BY RT-PCR (FLU A&B, COVID) ARPGX2
Influenza A by PCR: NEGATIVE
Influenza B by PCR: NEGATIVE
SARS Coronavirus 2 by RT PCR: NEGATIVE

## 2021-02-12 LAB — BRAIN NATRIURETIC PEPTIDE: B Natriuretic Peptide: 632.7 pg/mL — ABNORMAL HIGH (ref 0.0–100.0)

## 2021-02-12 MED ORDER — IPRATROPIUM-ALBUTEROL 0.5-2.5 (3) MG/3ML IN SOLN
6.0000 mL | Freq: Once | RESPIRATORY_TRACT | Status: AC
  Administered 2021-02-12: 6 mL via RESPIRATORY_TRACT
  Filled 2021-02-12: qty 3

## 2021-02-12 MED ORDER — FUROSEMIDE 10 MG/ML IJ SOLN
40.0000 mg | Freq: Once | INTRAMUSCULAR | Status: AC
  Administered 2021-02-12: 40 mg via INTRAVENOUS
  Filled 2021-02-12: qty 4

## 2021-02-12 MED ORDER — IOHEXOL 350 MG/ML SOLN
75.0000 mL | Freq: Once | INTRAVENOUS | Status: AC | PRN
  Administered 2021-02-12: 75 mL via INTRAVENOUS

## 2021-02-12 MED ORDER — METHYLPREDNISOLONE SODIUM SUCC 125 MG IJ SOLR
125.0000 mg | Freq: Once | INTRAMUSCULAR | Status: AC
  Administered 2021-02-12: 125 mg via INTRAVENOUS
  Filled 2021-02-12: qty 2

## 2021-02-12 NOTE — ED Provider Notes (Signed)
Emergency Medicine Provider Triage Evaluation Note  [name] , a 80 y.o. male  was evaluated in triage.  Pt complains of shortness of breath.  Patient presents with a week of increasing shortness of breath, most acute over the last 4 days.  Patient has chronic nasal congestion with no changes.  No reported fevers.  No cough.  Patient does have a strong history of clots, is on Coumadin for same.  Patient was seen in urgent care and referred to the ED to ensure no PE.  Patient also has a history of CHF.Marland Kitchen  Review of Systems  Positive: Shortness of breath Negative: Chest pain, fevers, chills, cough, abdominal complaints  Physical Exam  There were no vitals taken for this visit. Gen:   Awake, no distress   Resp:  Normal effort.  Patient does have wheezing on exam MSK:   Moves extremities without difficulty  Other:  No murmurs, rubs, gallops  Medical Decision Making  Medically screening exam initiated at 4:38 PM.  Appropriate orders placed.  [name] was informed that the remainder of the evaluation will be completed by another provider, this initial triage assessment does not replace that evaluation, and the importance of remaining in the ED until their evaluation is complete.  Patient was referred from urgent care for evaluation for possible PE.  Patient has shortness of breath without significant other symptoms.  No chest pain.  History of DVT and on Coumadin.  Patient will have labs, chest x-ray, CT angio chest, EKG, COVID swab   Darletta Moll, PA-C 02/12/21 1642    Carrie Mew, MD 02/14/21 0020

## 2021-02-12 NOTE — Telephone Encounter (Signed)
[facility] - Client TELEPHONE ADVICE RECORD [facility] Patient Name: [name] Gender: Male DOB: 1941/01/21 Age: 80 Y 59 M 13 D Return Phone Number: [phone] (Primary), [phone] (Secondary) Address: City/ State/ Zip: [place]  [address] Client [facility] - Client Client Site [facility] Copland, Frederico Hamman - MD Contact Type Call Who Is Calling Patient / Member / Family / Caregiver Call Type Triage / Clinical Caller Name [name] Relationship To Patient Spouse Return Phone Number [phone] (Primary) Chief Complaint BREATHING - shortness of breath or sounds breathless Reason for Call Symptomatic / Request for [place] states her husband has cough and shortness of breath. Translation No Nurse Assessment Nurse: Hardin Negus, RN, Mardene Celeste Date/Time Eilene Ghazi Time): 02/12/2021 11:24:33 AM Confirm and document reason for call. If symptomatic, describe symptoms. ---He is SOB when he gets up and walks around. He has a chronic cough, about the same. no fever. He has some dermatitis and he was soon by the MD. It is much better Does the patient have any new or worsening symptoms? ---Yes Will a triage be completed? ---Yes Related visit to physician within the last 2 weeks? ---No Does the PT have any chronic conditions? (i.e. diabetes, asthma, this includes High risk factors for pregnancy, etc.) ---Yes List chronic conditions. ---CHF. stents, Is this a behavioral health or substance abuse call? ---No Guidelines Guideline Title Affirmed Question Affirmed Notes Nurse Date/Time (Eastern Time) Disp. Time Eilene Ghazi Time) Disposition Final User 02/12/2021 11:20:41 AM Send to Urgent Queue Peggye Fothergill 02/12/2021 11:29:41 AM Clinical Call Yes Hardin Negus, RN, Harlon Flor NOTE: All timestamps contained within this report are represented as  [place] Standard Time. CONFIDENTIALTY NOTICE: This fax transmission is intended only for the addressee. It contains information that is legally privileged, confidential or otherwise protected from use or disclosure. If you are not the intended recipient, you are strictly prohibited from reviewing, disclosing, copying using or disseminating any of this information or taking any action in reliance on or regarding this information. If you have received this fax in error, please notify us immediately by telephone so that we can arrange for its return to [place]. Phone: [phone], Toll-Free: [phone], Fax: [phone] Page: 2 of 2 Call Id: [id number] Comments User: Ledora Bottcher, RN Date/Time Eilene Ghazi Time): 02/12/2021 11:29:28 AM He has an appointment on [facility]

## 2021-02-12 NOTE — ED Provider Notes (Signed)
[name]    CSN: [id number] Arrival date & time: 02/12/21  1404      History   Chief Complaint Chief Complaint  Patient presents with   Cough   Shortness of Breath    HPI [name] is a 80 y.o. male.  Patient presents with 4-day history of nonproductive cough and shortness of breath.  He has cough and shortness of breath at his baseline but states it is worse for the past 4 days.  His shortness of breath increases when he lies down.  He denies fever, chills, chest pain, or other symptoms.  Patient contacted his PCPs office today and was instructed to come to urgent care.  His medical history includes COPD, cardiomyopathy, hypertension, atrial fibrillation, CHF, intraventricular hemorrhage, AAA, DVT.  Patient is on warfarin.  The history is provided by the patient, the spouse and medical records.   Past Medical History:  Diagnosis Date   AAA (abdominal aortic aneurysm)    Aortic calcification ([place]) 05/01/2013   Bilateral renal artery stenosis (HCC)    a. 06/2013 Angio/PTA: LRA: 95 (5x18 Herculink stent), RRA 60ost, celiac/SMA nl, RCIA patent stents extending into dist Ao, RIIA 70ost, REIA min irregs, LCIA patent stent, LIIA 60-70ost, LEIA min irregs.   CHF (congestive heart failure) (HCC)    Patient states he doesn't have CHF anymore per cardiologist.   Coronary artery disease    History of prior cigarette smoking 04/11/2008   Qualifier: Diagnosis of  By: Diona Browner MD, Amy     Hypertension    Hypothyroidism    Intraventricular hemorrhage ([place]) 07/12/2012   Peripheral arterial disease ([place])    a. Previous left lower extremity stenting by Dr. Jamal Collin;  b. 12/2012 s/p bilat ostial common iliac stenting.   Right leg DVT ([place]) 08/13/2012   Subclavian artery stenosis, left ([place]) 05/2012   Status post stenting of the ostium and self-expanding stent placement to the left axillary artery   Venous insufficiency     Patient Active Problem List   Diagnosis Date Noted    Dermatitis 02/02/2021   Long term (current) use of anticoagulants 01/27/2017   DVT (deep venous thrombosis) ([place]) 12/29/2016   Paroxysmal a-fib: briefly in ED now in NSR 05/24/2013   Aortic calcification ([place]) 05/01/2013   CKD (chronic kidney disease) stage 3, GFR 30-59 ml/min (HCC) 05/01/2013   Bilateral renal artery stenosis (HCC)    HTN (hypertension) 07/15/2012   Intraventricular hemorrhage ([place]) 07/12/2012   Peripheral arterial disease ([place])    Ischemic cardiomyopathy 07/08/2011   CAD (coronary artery disease), native coronary artery 07/07/2011   Subclavian arterial stenosis ([place]) 04/25/2011   BPH (benign prostatic hyperplasia) 12/29/2010   COPD (chronic obstructive pulmonary disease) ([place]) 01/08/2010   Abdominal aortic aneurysm 11/06/2009   HYPERCHOLESTEROLEMIA 10/09/2009   History of prior cigarette smoking 04/11/2008   Hypothyroidism 09/07/2006    Past Surgical History:  Procedure Laterality Date   ABDOMINAL ANGIOGRAM  07/10/2013   WITH BI-FEMORAL RUNOFF       DR Fletcher Anon   ABDOMINAL AORTAGRAM N/A 12/26/2012   Procedure: ABDOMINAL Maxcine Ham;  Surgeon: Wellington Hampshire, MD;  Location: [place] CATH LAB;  Service: Cardiovascular;  Laterality: N/A;   ABDOMINAL AORTAGRAM N/A 07/10/2013   Procedure: ABDOMINAL Maxcine Ham;  Surgeon: Wellington Hampshire, MD;  Location: [place] CATH LAB;  Service: Cardiovascular;  Laterality: N/A;   ANGIOPLASTY / STENTING FEMORAL  05/2012   ANGIOPLASTY / STENTING ILIAC Bilateral 12/26/2012   ARCH AORTOGRAM N/A 06/20/2012   Procedure: [facility]  AORTOGRAM;  Surgeon: Wellington Hampshire, MD;  Location: [facility] C[facility] LAB;  Service: Cardiovascular;  Laterality: N/A;   CARDIAC CATHETERIZATION  2/14   [place]   CHOLECYSTECTOMY  1990's   CORONARY ARTERY BYPASS GRAFT  07/11/2011   Procedure: CORONARY ARTERY BYPASS GRAFTING (CABG);  Surgeon: Grace Isaac, MD;  Location: [place];  Service: Open Heart Surgery;  Laterality: N/A;  Times 3. On Pump. Using right greater saphenous vein and left internal  mammary artery.    HERNIA REPAIR     INSERTION OF ILIAC STENT Bilateral 12/26/2012   Procedure: INSERTION OF ILIAC STENT;  Surgeon: Wellington Hampshire, MD;  Location: [place] CATH LAB;  Service: Cardiovascular;  Laterality: Bilateral;  Bilateral Common Iliac Artery   LEFT AND RIGHT HEART CATHETERIZATION WITH CORONARY ANGIOGRAM  07/07/2011   Procedure: LEFT AND RIGHT HEART CATHETERIZATION WITH CORONARY ANGIOGRAM;  Surgeon: Minna Merritts, MD;  Location: [place] CATH LAB;  Service: Cardiovascular;;   LOWER EXTREMITY VENOGRAPHY Bilateral 12/30/2016   Procedure: Lower Extremity Venography;  Surgeon: Katha Cabal, MD;  Location: [place] CV LAB;  Service: Cardiovascular;  Laterality: Bilateral;   PERCUTANEOUS STENT INTERVENTION  06/20/2012   Procedure: PERCUTANEOUS STENT INTERVENTION;  Surgeon: Wellington Hampshire, MD;  Location: [place] CATH LAB;  Service: Cardiovascular;;   RENAL ARTERY ANGIOPLASTY Left 07/10/2013   DR ARIDA   SUBCLAVIAN ARTERY STENT  05/2012   "2 stents" (12/26/2012)   TOOTH EXTRACTION  Spring 2017   mulitple (6)   VENA CAVA FILTER PLACEMENT  07/2012   Removable       Home Medications    Prior to Admission medications   Medication Sig Start Date End Date Taking? Authorizing Provider  amLODipine (NORVASC) 5 MG tablet TAKE 1 TABLET BY MOUTH EVERY DAY 06/08/20  Yes Wellington Hampshire, MD  atorvastatin (LIPITOR) 20 MG tablet Take 1 tablet (20 mg total) by mouth daily. 12/16/20  Yes Wellington Hampshire, MD  carvedilol (COREG) 6.25 MG tablet TAKE 1 TABLET BY MOUTH TWICE A DAY WITH MEALS 06/08/20  Yes Copland, Frederico Hamman, MD  levothyroxine (SYNTHROID) 150 MCG tablet TAKE ONE TABLET BY MOUTH EVERY DAY BEFORE BREAKFAST 06/08/20  Yes Copland, Frederico Hamman, MD  triamcinolone cream (KENALOG) 0.1 % Apply 1 application topically 2 (two) times daily. 02/10/21  Yes Lesleigh Noe, MD  warfarin (COUMADIN) 5 MG tablet TAKE ONE TABLET BY MOUTH EVERY DAY OR AS DIRECTED BY COUMADIN CLINIC 06/03/20  Yes Copland, Frederico Hamman, MD   Fluticasone-Salmeterol (ADVAIR DISKUS) 250-50 MCG/DOSE AEPB Inhale 1 puff into the lungs 2 (two) times daily. 06/29/11 07/01/11  Copland, Frederico Hamman, MD  pravastatin (PRAVACHOL) 40 MG tablet Take 1 tablet (40 mg total) by mouth daily. 04/25/11 07/01/11  Minna Merritts, MD    Family History Family History  Problem Relation Age of Onset   Alcohol abuse Father    Cirrhosis Father    Hypothyroidism Sister     Social History Social History   Tobacco Use   Smoking status: Former    Packs/day: 1.00    Years: 50.00    Pack years: 50.00    Types: Cigarettes    Quit date: 12/25/2010    Years since quitting: 10.1   Smokeless tobacco: Never  Vaping Use   Vaping Use: Never used  Substance Use Topics   Alcohol use: No   Drug use: No     Allergies   Plavix [clopidogrel bisulfate]   Review of Systems Review of Systems  Constitutional:  Negative for chills  and fever.  HENT:  Negative for ear pain and sore throat.   Respiratory:  Positive for cough and shortness of breath.   Cardiovascular:  Negative for chest pain and palpitations.  Gastrointestinal:  Negative for abdominal pain and vomiting.  Skin:  Negative for color change and rash.  All other systems reviewed and are negative.   Physical Exam Triage Vital Signs ED Triage Vitals [02/12/21 1414]  Enc Vitals Group     BP (!) 161/81     Pulse Rate 81     Resp 18     Temp 97.7 F (36.5 C)     Temp Source Oral     SpO2 95 %     Weight      Height      Head Circumference      Peak Flow      Pain Score      Pain Loc      Pain Edu?      Excl. in [place]?    No data found.  Updated Vital Signs BP (!) 160/77 (BP Location: Right Arm)   Pulse 81   Temp 97.7 F (36.5 C) (Oral)   Resp 18   SpO2 96%   Visual Acuity Right Eye Distance:   Left Eye Distance:   Bilateral Distance:    Right Eye Near:   Left Eye Near:    Bilateral Near:     Physical Exam Vitals and nursing note reviewed.  Constitutional:      General: He is  not in acute distress.    Appearance: He is well-developed.  HENT:     Head: Normocephalic and atraumatic.     Right Ear: Tympanic membrane normal.     Left Ear: Tympanic membrane normal.     Nose: Nose normal.     Mouth/Throat:     Mouth: Mucous membranes are moist.     Pharynx: Oropharynx is clear.  Eyes:     Conjunctiva/sclera: Conjunctivae normal.  Cardiovascular:     Rate and Rhythm: Normal rate and regular rhythm.     Heart sounds: Normal heart sounds.  Pulmonary:     Effort: Pulmonary effort is normal. No respiratory distress.     Breath sounds: Rhonchi present.     Comments: Right mid and lower rhonchi. Abdominal:     Palpations: Abdomen is soft.     Tenderness: There is no abdominal tenderness.  Musculoskeletal:     Cervical back: Neck supple.  Skin:    General: Skin is warm and dry.  Neurological:     General: No focal deficit present.     Mental Status: He is alert and oriented to person, place, and time.     Gait: Gait normal.  Psychiatric:        Mood and Affect: Mood normal.        Behavior: Behavior normal.     UC Treatments / Results  Labs (all labs ordered are listed, but only abnormal results are displayed) Labs Reviewed  NOVEL CORONAVIRUS, NAA    EKG   Radiology DG Chest 2 View  Result Date: 02/12/2021 CLINICAL DATA:  Short of breath, cough EXAM: CHEST - 2 VIEW COMPARISON:  12/29/2016 FINDINGS: Frontal and lateral views of the chest demonstrate stable postsurgical changes from bypass surgery. Cardiac silhouette is stable. Chronic left pleural thickening versus trace left pleural effusion, stable. No acute airspace disease or pneumothorax. No acute bony abnormalities. IMPRESSION: 1. Stable postsurgical changes.  No acute process. Electronically Signed  By: Randa Ngo M.D.   On: 02/12/2021 15:03    Procedures Procedures (including critical care time)  Medications Ordered in UC Medications - No data to display  Initial Impression /  Assessment and Plan / UC Course  I have reviewed the triage vital signs and the nursing notes.  Pertinent labs & imaging results that were available during my care of the patient were reviewed by me and considered in my medical decision making (see chart for details).   Shortness of breath.  Patient has worsening shortness of breath for the past 4 days.  His chest x-ray shows no acute abnormality.  He has a complex cardiac history and history of DVT.  Sending him to the ED for evaluation.  He is accompanied by his wife; he feels stable to transport himself to the ED POV.   Final Clinical Impressions(s) / UC Diagnoses   Final diagnoses:  Shortness of breath     Discharge Instructions      Go to the emergency department for your acute shortness of breath with history of blood clots.         ED Prescriptions   None    PDMP not reviewed this encounter.   Sharion Balloon, NP 02/12/21 1517

## 2021-02-12 NOTE — ED Triage Notes (Signed)
Pt c/o no productive cough (which he states he has had for years) and SOB x 4 days.

## 2021-02-12 NOTE — Telephone Encounter (Signed)
I was unable to reach pt by phone; I did speak with pts wife (DPR signed) pt is not with wife at this moment; pt is having worsened dry cough with SOB when coughs and pt feeling weak. Pt is not dizzy but head does not feel right. Pt already has appt with Dr [name] on 02/15/21 at 2:40. Since pt has chronic cough but new symptom is significant SOB when coughing advised that pt should be seen today with no available appts at [facility] and I scheduled pt an appt at [facility] in [facility] 02/12/21 at 2:15. Pts wife will let pt be aware of the above info; pts wife does not want Dr Copland's appt cancelled. Pt has not been covid tested yet. Sending note to Dr [name] who is out of office today and Butch Penny CMA. Will also teams Butch Penny.

## 2021-02-12 NOTE — ED Notes (Signed)
Seen pt  in room at this time , AAOPx4, respi even,  noted Pt dyspnea w/ exertion.  Speaks in full w/o distress

## 2021-02-12 NOTE — Discharge Instructions (Addendum)
Go to the emergency department for your acute shortness of breath with history of blood clots.

## 2021-02-12 NOTE — ED Provider Notes (Signed)
[facility] Emergency Department Provider Note ____________________________________________   Event Date/Time   First MD Initiated Contact with Patient 02/12/21 2302     (approximate)  I have reviewed the triage vital signs and the nursing notes.  HISTORY  Chief Complaint Shortness of Breath   HPI [name] is a 79 y.o. malewho presents to the ED for evaluation of shortness of breath.   Chart review indicates PAD with stenting of left subclavian and axillary arteries, common iliac and left renal arteries.  CAD s/p CABG.  HTN, HLD and COPD. Hx ICH on DAPT -> DVT -> IVC filter and now on [facility] with coumadin.  Patient denies taking any inhalers, COPD controller medications, fluid pills or diuretics.  Patient presents to the ED for evaluation of 1 week of worsening shortness of breath and nonproductive cough.  He reports associated orthopnea and dyspnea on exertion.  Improved respiratory status with sitting upright and rest.  Denies any chest pain or pressure, syncope, nausea or emesis.  Denies fevers or other generalized symptoms.  Past Medical History:  Diagnosis Date   AAA (abdominal aortic aneurysm)    Aortic calcification ([place]) 05/01/2013   Bilateral renal artery stenosis (HCC)    a. 06/2013 Angio/PTA: LRA: 95 (5x18 Herculink stent), RRA 60ost, celiac/SMA nl, RCIA patent stents extending into dist Ao, RIIA 70ost, REIA min irregs, LCIA patent stent, LIIA 60-70ost, LEIA min irregs.   CHF (congestive heart failure) (HCC)    Patient states he doesn't have CHF anymore per cardiologist.   Coronary artery disease    History of prior cigarette smoking 04/11/2008   Qualifier: Diagnosis of  By: Diona Browner MD, Amy     Hypertension    Hypothyroidism    Intraventricular hemorrhage ([place]) 07/12/2012   Peripheral arterial disease ([place])    a. Previous left lower extremity stenting by Dr. Jamal Collin;  b. 12/2012 s/p bilat ostial common iliac stenting.   Right leg DVT ([place])  08/13/2012   Subclavian artery stenosis, left ([place]) 05/2012   Status post stenting of the ostium and self-expanding stent placement to the left axillary artery   Venous insufficiency     Patient Active Problem List   Diagnosis Date Noted   Dermatitis 02/02/2021   Long term (current) use of anticoagulants 01/27/2017   DVT (deep venous thrombosis) ([place]) 12/29/2016   Paroxysmal a-fib: briefly in ED now in NSR 05/24/2013   Aortic calcification ([place]) 05/01/2013   CKD (chronic kidney disease) stage 3, GFR 30-59 ml/min (HCC) 05/01/2013   Bilateral renal artery stenosis (HCC)    HTN (hypertension) 07/15/2012   Intraventricular hemorrhage ([place]) 07/12/2012   Peripheral arterial disease ([place])    Ischemic cardiomyopathy 07/08/2011   CAD (coronary artery disease), native coronary artery 07/07/2011   Subclavian arterial stenosis ([place]) 04/25/2011   BPH (benign prostatic hyperplasia) 12/29/2010   COPD (chronic obstructive pulmonary disease) ([place]) 01/08/2010   Abdominal aortic aneurysm 11/06/2009   HYPERCHOLESTEROLEMIA 10/09/2009   History of prior cigarette smoking 04/11/2008   Hypothyroidism 09/07/2006    Past Surgical History:  Procedure Laterality Date   ABDOMINAL ANGIOGRAM  07/10/2013   WITH BI-FEMORAL RUNOFF       DR Fletcher Anon   ABDOMINAL AORTAGRAM N/A 12/26/2012   Procedure: ABDOMINAL Maxcine Ham;  Surgeon: Wellington Hampshire, MD;  Location: [place] CATH LAB;  Service: Cardiovascular;  Laterality: N/A;   ABDOMINAL AORTAGRAM N/A 07/10/2013   Procedure: ABDOMINAL Maxcine Ham;  Surgeon: Wellington Hampshire, MD;  Location: [place] CATH LAB;  Service: Cardiovascular;  Laterality: N/A;   ANGIOPLASTY / STENTING FEMORAL  05/2012   ANGIOPLASTY / STENTING ILIAC Bilateral 12/26/2012   ARCH AORTOGRAM N/A 06/20/2012   Procedure: ARCH AORTOGRAM;  Surgeon: Wellington Hampshire, MD;  Location: [place] CATH LAB;  Service: Cardiovascular;  Laterality: N/A;   CARDIAC CATHETERIZATION  2/14   [place]   CHOLECYSTECTOMY  1990's   CORONARY ARTERY  BYPASS GRAFT  07/11/2011   Procedure: CORONARY ARTERY BYPASS GRAFTING (CABG);  Surgeon: Grace Isaac, MD;  Location: [place];  Service: Open Heart Surgery;  Laterality: N/A;  Times 3. On Pump. Using right greater saphenous vein and left internal mammary artery.    HERNIA REPAIR     INSERTION OF ILIAC STENT Bilateral 12/26/2012   Procedure: INSERTION OF ILIAC STENT;  Surgeon: Wellington Hampshire, MD;  Location: [place] CATH LAB;  Service: Cardiovascular;  Laterality: Bilateral;  Bilateral Common Iliac Artery   LEFT AND RIGHT HEART CATHETERIZATION WITH CORONARY ANGIOGRAM  07/07/2011   Procedure: LEFT AND RIGHT HEART CATHETERIZATION WITH CORONARY ANGIOGRAM;  Surgeon: Minna Merritts, MD;  Location: [place] CATH LAB;  Service: Cardiovascular;;   LOWER EXTREMITY VENOGRAPHY Bilateral 12/30/2016   Procedure: Lower Extremity Venography;  Surgeon: Katha Cabal, MD;  Location: [place] CV LAB;  Service: Cardiovascular;  Laterality: Bilateral;   PERCUTANEOUS STENT INTERVENTION  06/20/2012   Procedure: PERCUTANEOUS STENT INTERVENTION;  Surgeon: Wellington Hampshire, MD;  Location: [place] CATH LAB;  Service: Cardiovascular;;   RENAL ARTERY ANGIOPLASTY Left 07/10/2013   DR ARIDA   SUBCLAVIAN ARTERY STENT  05/2012   "2 stents" (12/26/2012)   TOOTH EXTRACTION  Spring 2017   mulitple (6)   VENA CAVA FILTER PLACEMENT  07/2012   Removable    Prior to Admission medications   Medication Sig Start Date End Date Taking? Authorizing Provider  albuterol (VENTOLIN HFA) 108 (90 Base) MCG/ACT inhaler Inhale 2 puffs into the lungs every 6 (six) hours as needed for wheezing or shortness of breath. 02/13/21  Yes Vladimir Crofts, MD  furosemide (LASIX) 40 MG tablet Take 1 tablet (40 mg total) by mouth daily. 02/13/21 02/13/22 Yes Vladimir Crofts, MD  predniSONE (DELTASONE) 50 MG tablet Take 1 tablet (50 mg total) by mouth daily for 4 days. 02/13/21 02/17/21 Yes Vladimir Crofts, MD  amLODipine (NORVASC) 5 MG tablet TAKE 1 TABLET BY MOUTH EVERY DAY  06/08/20   Wellington Hampshire, MD  atorvastatin (LIPITOR) 20 MG tablet Take 1 tablet (20 mg total) by mouth daily. 12/16/20   Wellington Hampshire, MD  carvedilol (COREG) 6.25 MG tablet TAKE 1 TABLET BY MOUTH TWICE A DAY WITH MEALS 06/08/20   Copland, Frederico Hamman, MD  levothyroxine (SYNTHROID) 150 MCG tablet TAKE ONE TABLET BY MOUTH EVERY DAY BEFORE BREAKFAST 06/08/20   Copland, Frederico Hamman, MD  triamcinolone cream (KENALOG) 0.1 % Apply 1 application topically 2 (two) times daily. 02/10/21   Lesleigh Noe, MD  warfarin (COUMADIN) 5 MG tablet TAKE ONE TABLET BY MOUTH EVERY DAY OR AS DIRECTED BY COUMADIN CLINIC 06/03/20   Copland, Frederico Hamman, MD  Fluticasone-Salmeterol (ADVAIR DISKUS) 250-50 MCG/DOSE AEPB Inhale 1 puff into the lungs 2 (two) times daily. 06/29/11 07/01/11  Copland, Frederico Hamman, MD  pravastatin (PRAVACHOL) 40 MG tablet Take 1 tablet (40 mg total) by mouth daily. 04/25/11 07/01/11  Minna Merritts, MD    Allergies Plavix [clopidogrel bisulfate]  Family History  Problem Relation Age of Onset   Alcohol abuse Father    Cirrhosis Father    Hypothyroidism Sister  Social History Social History   Tobacco Use   Smoking status: Former    Packs/day: 1.00    Years: 50.00    Pack years: 50.00    Types: Cigarettes    Quit date: 12/25/2010    Years since quitting: 10.1   Smokeless tobacco: Never  Vaping Use   Vaping Use: Never used  Substance Use Topics   Alcohol use: No   Drug use: No    Review of Systems  Constitutional: No fever/chills Eyes: No visual changes. ENT: No sore throat. Cardiovascular: Denies chest pain. Respiratory: Positive shortness of breath and nonproductive cough. Gastrointestinal: No abdominal pain.  No nausea, no vomiting.  No diarrhea.  No constipation. Genitourinary: Negative for dysuria. Musculoskeletal: Negative for back pain. Skin: Negative for rash. Neurological: Negative for headaches, focal weakness or  numbness. ____________________________________________   PHYSICAL EXAM:  VITAL SIGNS: Vitals:   02/12/21 1638 02/12/21 2207  BP: (!) 159/82 (!) 175/88  Pulse: 81 82  Resp: 16 18  Temp: 97.7 F (36.5 C)   SpO2: 97% 96%    Constitutional: Alert and oriented.  Sitting upright in bed, speaking in phrases only.  Obviously a little dyspneic. Eyes: Conjunctivae are normal. PERRL. EOMI. Head: Atraumatic. Nose: No congestion/rhinnorhea. Mouth/Throat: Mucous membranes are moist.  Oropharynx non-erythematous. Neck: No stridor. No cervical spine tenderness to palpation. Cardiovascular: Normal rate, regular rhythm. Grossly normal heart sounds.  Good peripheral circulation. Respiratory: Tachypneic to the mid 20s.  Sitting upright in bed and holding onto bilateral rails on the stretcher.  Diffuse expiratory wheezes and decreased air movement throughout.  Bibasilar crackles are present. Gastrointestinal: Soft , nondistended, nontender to palpation. No CVA tenderness. Musculoskeletal: No lower extremity tenderness.  No joint effusions. No signs of acute trauma. Pitting edema to bilateral lower extremities up to the knees. Neurologic:  Normal speech and language. No gross focal neurologic deficits are appreciated. Skin:  Skin is warm, dry and intact. No rash noted. Psychiatric: Mood and affect are normal. Speech and behavior are normal. ____________________________________________   LABS (all labs ordered are listed, but only abnormal results are displayed)  Labs Reviewed  COMPREHENSIVE METABOLIC PANEL - Abnormal; Notable for the following components:      Result Value   Glucose, Bld 100 (*)    Total Bilirubin 1.5 (*)    All other components within normal limits  BRAIN NATRIURETIC PEPTIDE - Abnormal; Notable for the following components:   B Natriuretic Peptide 632.7 (*)    All other components within normal limits  PROTIME-INR - Abnormal; Notable for the following components:    Prothrombin Time 22.2 (*)    INR 2.0 (*)    All other components within normal limits  TROPONIN I (HIGH SENSITIVITY) - Abnormal; Notable for the following components:   Troponin I (High Sensitivity) 39 (*)    All other components within normal limits  TROPONIN I (HIGH SENSITIVITY) - Abnormal; Notable for the following components:   Troponin I (High Sensitivity) 38 (*)    All other components within normal limits  RESP PANEL BY RT-PCR (FLU A&B, COVID) [id number]  CBC WITH DIFFERENTIAL/PLATELET   ____________________________________________  12 Lead EKG  Sinus rhythm, rate of 81 bpm.  Normal axis and intervals.  T wave inversions isolated to lead III.  No further evidence of acute ischemia. Similar to EKG from February ____________________________________________  [place]  ED MD interpretation:  CXR reviewed by me without evidence of acute cardiopulmonary pathology.   Official radiology report(s): DG Chest 2 View  Result Date: 02/12/2021 CLINICAL DATA:  Short of breath, cough EXAM: CHEST - 2 VIEW COMPARISON:  12/29/2016 FINDINGS: Frontal and lateral views of the chest demonstrate stable postsurgical changes from bypass surgery. Cardiac silhouette is stable. Chronic left pleural thickening versus trace left pleural effusion, stable. No acute airspace disease or pneumothorax. No acute bony abnormalities. IMPRESSION: 1. Stable postsurgical changes.  No acute process. Electronically Signed   By: Randa Ngo M.D.   On: 02/12/2021 15:03   CT Angio Chest PE W and/or Wo Contrast  Result Date: 02/12/2021 CLINICAL DATA:  Short of breath for 4 days, cough EXAM: CT ANGIOGRAPHY CHEST WITH CONTRAST TECHNIQUE: Multidetector CT imaging of the chest was performed using the standard protocol during bolus administration of intravenous contrast. Multiplanar CT image reconstructions and MIPs were obtained to evaluate the vascular anatomy. CONTRAST:  69mL OMNIPAQUE IOHEXOL 350 MG/ML SOLN COMPARISON:   02/12/2021, 09/13/2012 FINDINGS: Cardiovascular: This is a technically adequate evaluation of the pulmonary vasculature. There are no filling defects or pulmonary emboli. The heart is unremarkable without pericardial effusion. Postsurgical changes from previous CABG. Normal caliber of the thoracic aorta, with severe atherosclerosis noted throughout the aorta and coronary vasculature. Mediastinum/Nodes: No enlarged mediastinal, hilar, or axillary lymph nodes. Thyroid gland, trachea, and esophagus demonstrate no significant findings. Lungs/Pleura: Upper lobe predominant emphysema. There is a small amount of pleural fluid loculated within the left major fissure. Otherwise no effusion or pneumothorax. No acute airspace disease. Central airways are patent. Upper Abdomen: No acute abnormality. Musculoskeletal: No acute or destructive bony lesions. Reconstructed images demonstrate no additional findings. Review of the MIP images confirms the above findings. IMPRESSION: 1. No evidence of pulmonary embolus. 2. Small amount of pleural fluid within the left major fissure. 3. Aortic Atherosclerosis (ICD10-I70.0) and Emphysema (ICD10-J43.9). Electronically Signed   By: Randa Ngo M.D.   On: 02/12/2021 18:30    ____________________________________________   PROCEDURES and INTERVENTIONS  Procedure(s) performed (including Critical Care):  .1-3 Lead EKG Interpretation Performed by: Vladimir Crofts, MD Authorized by: Vladimir Crofts, MD     Interpretation: normal     ECG rate:  80   ECG rate assessment: normal     Rhythm: sinus rhythm     Ectopy: none     Conduction: normal    Medications  iohexol (OMNIPAQUE) 350 MG/ML injection 75 mL (75 mLs Intravenous Contrast Given 02/12/21 1809)  furosemide (LASIX) injection 40 mg (40 mg Intravenous Given 02/12/21 2347)  methylPREDNISolone sodium succinate (SOLU-MEDROL) 125 mg/2 mL injection 125 mg (125 mg Intravenous Given 02/12/21 2347)  ipratropium-albuterol (DUONEB)  0.5-2.5 (3) MG/3ML nebulizer solution 6 mL (6 mLs Nebulization Given 02/12/21 2347)    ____________________________________________   MDM / ED COURSE   80 year old male presents to the ED with evidence of CHF and COPD exacerbations, refusing medical admissions, and amenable to trial of outpatient management.  No hypoxia or instability.  He is sitting upright in bed and obviously a little uncomfortable, tachypneic and dyspneic.  Speaking in phrases only.  Evidence of volume overload and COPD exacerbations are present.  Due to his extensive history of clots, CTA obtained and demonstrates no evidence of PE or pneumonia, but pleural fluid is noted.  Blood work with elevated BNP, therapeutic INR and flat troponins.  No evidence of ACS, pneumonia.  I think he would benefit from further diuresis in the hospital, but he refuses.  We will discharge with Lasix, albuterol and steroids, with clear return precautions for the ED.  Clinical Course as of 02/13/21  0737  Fri Feb 12, 2021  2344 Advised patient and his wife of my recommendation for medical admission, but he refused.  They report that they are hungry and that there is no way that they are going to stay tonight.  We discussed COPD exacerbation and CHF exacerbations, but he reports he has an appoint with his PCP on Monday anyways and can follow-up with them. [DS]  Sat Feb 13, 2021  0038 Reassessed.  Improved wheezing and airflow, with persistent dyspnea, tachypnea and sitting upright in bed.  I think he would benefit from medical admission, and I recommend this to him again.  He again refuses and indicates that there is no way that he is staying in the hospital this weekend.  We therefore discussed management at home and following up with his PCP on Monday.  We discussed return precautions. [DS]    Clinical Course User Index [DS] Vladimir Crofts, MD    ____________________________________________   FINAL CLINICAL IMPRESSION(S) / ED DIAGNOSES  Final  diagnoses:  COPD exacerbation ([place])  SOB (shortness of breath)  Acute on chronic diastolic congestive heart failure [facility])     ED Discharge Orders          Ordered    furosemide (LASIX) 40 MG tablet  Daily        02/13/21 0040    predniSONE (DELTASONE) 50 MG tablet  Daily        02/13/21 0040    albuterol (VENTOLIN HFA) 108 (90 Base) MCG/ACT inhaler  Every 6 hours PRN        02/13/21 0040             Yorel Redder   Note:  This document was prepared using Dragon voice recognition software and may include unintentional dictation errors.    Vladimir Crofts, MD 02/13/21 [phone]

## 2021-02-12 NOTE — ED Triage Notes (Signed)
Pt to ED via POV for shortness of breath. Pt states that he has been more short of breath for the past week. Pt states that he was seen at urgent care and had a COVID test and a chest xray. Pt has also had cough and some nasal congestion. Pt denies chest pain. Pt does have hx/o blood clots, currently on coumadin. Pt states levels therapeutic when last checked. Pt is in NAD.

## 2021-02-13 MED ORDER — PREDNISONE 50 MG PO TABS: 50.0000 mg | ORAL_TABLET | Freq: Every day | ORAL | 0 refills | Status: DC

## 2021-02-13 MED ORDER — ALBUTEROL SULFATE HFA 108 (90 BASE) MCG/ACT IN AERS: 2.0000 | INHALATION_SPRAY | Freq: Four times a day (QID) | RESPIRATORY_TRACT | 2 refills | Status: DC | PRN

## 2021-02-13 MED ORDER — FUROSEMIDE 40 MG PO TABS: 40.0000 mg | ORAL_TABLET | Freq: Every day | ORAL | 0 refills | Status: DC

## 2021-02-13 NOTE — Discharge Instructions (Addendum)
You are being discharged with 3 prescriptions:  Albuterol inhaler to use as needed every 4-6 hours for shortness of breath and wheezing.  Use 1-2 puffs per dose.  Prednisone steroid pills to take once daily for the next 4 days to finish a 5-day course, we gave you 1 dose and IV while in the ED.  Lasix fluid pill to take once daily to help remove extra fluid from behind your heart.   If you develop further worsening symptoms despite these measures, please return to the ED.    Please ensure you follow-up with Dr. [name] on Monday.

## 2021-02-15 ENCOUNTER — Ambulatory Visit (INDEPENDENT_AMBULATORY_CARE_PROVIDER_SITE_OTHER): Payer: Medicare Other | Admitting: Family Medicine

## 2021-02-15 ENCOUNTER — Encounter: Payer: Self-pay | Admitting: Family Medicine

## 2021-02-15 ENCOUNTER — Other Ambulatory Visit: Payer: Self-pay

## 2021-02-15 VITALS — BP 130/70 | HR 74 | Temp 97.8°F | Ht 69.0 in | Wt 175.1 lb

## 2021-02-15 DIAGNOSIS — I255 Ischemic cardiomyopathy: Secondary | ICD-10-CM | POA: Diagnosis not present

## 2021-02-15 DIAGNOSIS — Z79899 Other long term (current) drug therapy: Secondary | ICD-10-CM | POA: Diagnosis not present

## 2021-02-15 DIAGNOSIS — I739 Peripheral vascular disease, unspecified: Secondary | ICD-10-CM

## 2021-02-15 DIAGNOSIS — J441 Chronic obstructive pulmonary disease with (acute) exacerbation: Secondary | ICD-10-CM | POA: Diagnosis not present

## 2021-02-15 DIAGNOSIS — R778 Other specified abnormalities of plasma proteins: Secondary | ICD-10-CM

## 2021-02-15 DIAGNOSIS — R7989 Other specified abnormal findings of blood chemistry: Secondary | ICD-10-CM | POA: Diagnosis not present

## 2021-02-15 LAB — BASIC METABOLIC PANEL
BUN: 33 mg/dL — ABNORMAL HIGH (ref 6–23)
CO2: 31 mEq/L (ref 19–32)
Calcium: 9.1 mg/dL (ref 8.4–10.5)
Chloride: 103 mEq/L (ref 96–112)
Creatinine, Ser: 1.42 mg/dL (ref 0.40–1.50)
GFR: 46.93 mL/min — ABNORMAL LOW (ref >60.00)
Glucose, Bld: 101 mg/dL — ABNORMAL HIGH (ref 70–99)
Potassium: 3.9 mEq/L (ref 3.5–5.1)
Sodium: 143 mEq/L (ref 135–145)

## 2021-02-15 MED ORDER — PREDNISONE 20 MG PO TABS: ORAL_TABLET | ORAL | 0 refills | Status: DC

## 2021-02-15 NOTE — Progress Notes (Signed)
[name] Mcmillen T. Elzie Sheets, MD, [place] at [facility] [place], [address]  Phone: [phone]  FAX: [phone]  LEJEND DALBY - 80 y.o. male  MRN [id number]  Date of Birth: [birth year]  Date: 02/15/2021  PCP: Owens Loffler, MD  Referral: Owens Loffler, MD  Chief Complaint  Patient presents with   Shortness of Breath    Seen in ER 02/12/2021   Follow-up    Rash    This visit occurred during the SARS-CoV-2 public health emergency.  Safety protocols were in place, including screening questions prior to the visit, additional usage of staff PPE, and extensive cleaning of exam room while observing appropriate contact time as indicated for disinfecting solutions.   Subjective:   [name] is a 80 y.o. very pleasant male patient with Body mass index is 25.86 kg/m. who presents with the following:  2013 CABG by Dr. Servando Snare.  He is a well-known patient with some severe COPD as well as multiple other cardiac issues including cardiac disease status post CABG as well as some severe peripheral arterial disease.  He also had a intracranial hemorrhage as well as a DVT in the past.  He also currently is taking Coumadin.  He has had approximately 10 days of shortness of breath, and he went to the ER short of breath is having some dyspnea on exertion.  They gave him 40 mg of Lasix as well as some prednisone to take.  They suggested inpatient treatment, but he declined.  It also looks like he had an elevated cardiac BNP to 600 and he also had an elevated troponin x2. Troponin was 39 with repeat at 38.  05/2020 last ov with dr. Avie Arenas out of the ER feeling better.  Acute CHF with BNP to 600.  Covid was negative.   2018, 55-60% Lasix 40 mg daily -right now.  Previously, he was not on any.  2013 did have EF 35-40, 06/2011 - at cabg time  Review of Systems is noted in the HPI, as appropriate  Patient Active  Problem List   Diagnosis Date Noted   CKD (chronic kidney disease) stage 3, GFR 30-59 ml/min ([place]) 05/01/2013    Priority: 1.   Intraventricular hemorrhage ([place]) 07/12/2012    Priority: 1.   Ischemic cardiomyopathy 07/08/2011    Priority: 1.   CAD (coronary artery disease), native coronary artery 07/07/2011    Priority: 1.   COPD (chronic obstructive pulmonary disease) ([place]) 01/08/2010    Priority: 1.   Bilateral renal artery stenosis (HCC)     Priority: 2.   HTN (hypertension) 07/15/2012    Priority: 2.   Peripheral arterial disease ([place])     Priority: 2.   Hypothyroidism 09/07/2006    Priority: 2.   Long term (current) use of anticoagulants 01/27/2017   DVT (deep venous thrombosis) ([place]) 12/29/2016   Paroxysmal a-fib: briefly in ED now in NSR 05/24/2013   Aortic calcification ([place]) 05/01/2013   Subclavian arterial stenosis ([place]) 04/25/2011   BPH (benign prostatic hyperplasia) 12/29/2010   Abdominal aortic aneurysm 11/06/2009   HYPERCHOLESTEROLEMIA 10/09/2009   History of prior cigarette smoking 04/11/2008    Past Medical History:  Diagnosis Date   AAA (abdominal aortic aneurysm)    Aortic calcification ([place]) 05/01/2013   Bilateral renal artery stenosis ([place])    a. 06/2013 Angio/PTA: LRA: 95 (5x18 Herculink stent), RRA 60ost, celiac/SMA nl, RCIA patent stents extending into dist Ao, RIIA 70ost,  REIA min irregs, LCIA patent stent, LIIA 60-70ost, LEIA min irregs.   CHF (congestive heart failure) (HCC)    Patient states he doesn't have CHF anymore per cardiologist.   Coronary artery disease    History of prior cigarette smoking 04/11/2008   Qualifier: Diagnosis of  By: Diona Browner MD, Amy     Hypertension    Hypothyroidism    Intraventricular hemorrhage ([place]) 07/12/2012   Peripheral arterial disease ([place])    a. Previous left lower extremity stenting by Dr. Jamal Collin;  b. 12/2012 s/p bilat ostial common iliac stenting.   Right leg DVT ([place]) 08/13/2012   Subclavian artery stenosis,  left ([place]) 05/2012   Status post stenting of the ostium and self-expanding stent placement to the left axillary artery   Venous insufficiency     Past Surgical History:  Procedure Laterality Date   ABDOMINAL ANGIOGRAM  07/10/2013   WITH BI-FEMORAL RUNOFF       DR Fletcher Anon   ABDOMINAL AORTAGRAM N/A 12/26/2012   Procedure: ABDOMINAL Maxcine Ham;  Surgeon: Wellington Hampshire, MD;  Location: [facility] CATH LAB;  Service: Cardiovascular;  Laterality: N/A;   ABDOMINAL AORTAGRAM N/A 07/10/2013   Procedure: ABDOMINAL Maxcine Ham;  Surgeon: Wellington Hampshire, MD;  Location: [facility] CATH LAB;  Service: Cardiovascular;  Laterality: N/A;   ANGIOPLASTY / STENTING FEMORAL  05/2012   ANGIOPLASTY / STENTING ILIAC Bilateral 12/26/2012   ARCH AORTOGRAM N/A 06/20/2012   Procedure: ARCH AORTOGRAM;  Surgeon: Wellington Hampshire, MD;  Location: [facility] CATH LAB;  Service: Cardiovascular;  Laterality: N/A;   CARDIAC CATHETERIZATION  2/14   [place]   CHOLECYSTECTOMY  1990's   CORONARY ARTERY BYPASS GRAFT  07/11/2011   Procedure: CORONARY ARTERY BYPASS GRAFTING (CABG);  Surgeon: Grace Isaac, MD;  Location: [facility];  Service: Open Heart Surgery;  Laterality: N/A;  Times 3. On Pump. Using right greater saphenous vein and left internal mammary artery.    HERNIA REPAIR     INSERTION OF ILIAC STENT Bilateral 12/26/2012   Procedure: INSERTION OF ILIAC STENT;  Surgeon: Wellington Hampshire, MD;  Location: [facility] CATH LAB;  Service: Cardiovascular;  Laterality: Bilateral;  Bilateral Common Iliac Artery   LEFT AND RIGHT HEART CATHETERIZATION WITH CORONARY ANGIOGRAM  07/07/2011   Procedure: LEFT AND RIGHT HEART CATHETERIZATION WITH CORONARY ANGIOGRAM;  Surgeon: Minna Merritts, MD;  Location: [facility] CATH LAB;  Service: Cardiovascular;;   LOWER EXTREMITY VENOGRAPHY Bilateral 12/30/2016   Procedure: Lower Extremity Venography;  Surgeon: Katha Cabal, MD;  Location: [facility] CV LAB;  Service: Cardiovascular;  Laterality: Bilateral;   PERCUTANEOUS STENT INTERVENTION   06/20/2012   Procedure: PERCUTANEOUS STENT INTERVENTION;  Surgeon: Wellington Hampshire, MD;  Location: [facility] CATH LAB;  Service: Cardiovascular;;   RENAL ARTERY ANGIOPLASTY Left 07/10/2013   DR ARIDA   SUBCLAVIAN ARTERY STENT  05/2012   "2 stents" (12/26/2012)   TOOTH EXTRACTION  Spring 2017   mulitple (6)   VENA CAVA FILTER PLACEMENT  07/2012   Removable    Family History  Problem Relation Age of Onset   Alcohol abuse Father    Cirrhosis Father    Hypothyroidism Sister      Objective:   BP 130/70   Pulse 74   Temp 97.8 F (36.6 C) (Temporal)   Ht 5\' 9"  (1.753 m)   Wt 175 lb 2 oz (79.4 kg)   SpO2 96%   BMI 25.86 kg/m   GEN: No acute distress; alert,appropriate. PULM: Breathing comfortably in no respiratory distress PSYCH:  Normally interactive.  CV: RRR, no m/g/r  PULM: Normal respiratory rate, no accessory muscle use.  He does have some rare crackles without wheezing.  Laboratory and Imaging Data: Results for orders placed or performed in visit on 29/47/65  Basic metabolic panel  Result Value Ref Range   Sodium 143 135 - 145 mEq/L   Potassium 3.9 3.5 - 5.1 mEq/L   Chloride 103 96 - 112 mEq/L   CO2 31 19 - 32 mEq/L   Glucose, Bld 101 (H) 70 - 99 mg/dL   BUN 33 (H) 6 - 23 mg/dL   Creatinine, Ser 1.42 0.40 - 1.50 mg/dL   GFR 46.93 (L) >60.00 mL/min   Calcium 9.1 8.4 - 10.5 mg/dL    Recent Results (from the past 2160 hour(s))  POCT INR     Status: Abnormal   Collection Time: 12/18/20 12:00 AM  Result Value Ref Range   INR 1.7 (A) 2.0 - 3.0  POCT INR     Status: None   Collection Time: 01/29/21 12:00 AM  Result Value Ref Range   INR 2.8 2.0 - 3.0  Comprehensive metabolic panel     Status: Abnormal   Collection Time: 02/12/21  4:57 PM  Result Value Ref Range   Sodium 138 135 - 145 mmol/L   Potassium 4.1 3.5 - 5.1 mmol/L   Chloride 101 98 - 111 mmol/L   CO2 26 22 - 32 mmol/L   Glucose, Bld 100 (H) 70 - 99 mg/dL    Comment: Glucose reference range applies only to  samples taken after fasting for at least 8 hours.   BUN 20 8 - 23 mg/dL   Creatinine, Ser 1.10 0.61 - 1.24 mg/dL   Calcium 9.3 8.9 - 10.3 mg/dL   Total Protein 8.0 6.5 - 8.1 g/dL   Albumin 4.6 3.5 - 5.0 g/dL   AST 16 15 - 41 U/L   ALT 11 0 - 44 U/L   Alkaline Phosphatase 99 38 - 126 U/L   Total Bilirubin 1.5 (H) 0.3 - 1.2 mg/dL   GFR, Estimated >60 >60 mL/min    Comment: (NOTE) Calculated using the CKD-EPI Creatinine Equation (2021)    Anion gap 11 5 - 15    Comment: Performed at [facility], [place], [place] [address]  Troponin I (High Sensitivity)     Status: Abnormal   Collection Time: 02/12/21  4:57 PM  Result Value Ref Range   Troponin I (High Sensitivity) 39 (H) <18 ng/L    Comment: (NOTE) Elevated high sensitivity troponin I (hsTnI) values and significant  changes across serial measurements may suggest ACS but many other  chronic and acute conditions are known to elevate hsTnI results.  Refer to the "Links" section for chest pain algorithms and additional  guidance. Performed at [facility], [address] [place], [place] [address]   CBC with Differential     Status: None   Collection Time: 02/12/21  4:57 PM  Result Value Ref Range   WBC 7.6 4.0 - 10.5 K/uL   RBC 5.63 4.22 - 5.81 MIL/uL   Hemoglobin 16.5 13.0 - 17.0 g/dL   HCT 51.0 39.0 - 52.0 %   MCV 90.6 80.0 - 100.0 fL   MCH 29.3 26.0 - 34.0 pg   MCHC 32.4 30.0 - 36.0 g/dL   RDW 14.3 11.5 - 15.5 %   Platelets 210 150 - 400 K/uL   nRBC 0.0 0.0 - 0.2 %   Neutrophils  Relative % 56 %   Neutro Abs 4.2 1.7 - 7.7 K/uL   Lymphocytes Relative 29 %   Lymphs Abs 2.2 0.7 - 4.0 K/uL   Monocytes Relative 10 %   Monocytes Absolute 0.8 0.1 - 1.0 K/uL   Eosinophils Relative 4 %   Eosinophils Absolute 0.3 0.0 - 0.5 K/uL   Basophils Relative 1 %   Basophils Absolute 0.1 0.0 - 0.1 K/uL   Immature Granulocytes 0 %   Abs Immature Granulocytes 0.03 0.00 - 0.07 K/uL    Comment: Performed  at [facility], [address] [place], [place] [address]  Protime-INR     Status: Abnormal   Collection Time: 02/12/21  4:57 PM  Result Value Ref Range   Prothrombin Time 22.2 (H) 11.4 - 15.2 seconds   INR 2.0 (H) 0.8 - 1.2    Comment: (NOTE) INR goal varies based on device and disease states. Performed at [facility], [address] [place], [place] [address]   Brain natriuretic peptide     Status: Abnormal   Collection Time: 02/12/21  5:01 PM  Result Value Ref Range   B Natriuretic Peptide 632.7 (H) 0.0 - 100.0 pg/mL    Comment: Performed at [facility], [address] [place], [place] [address]  Resp Panel by RT-PCR (Flu A&B, Covid) Nasopharyngeal Swab     Status: None   Collection Time: 02/12/21  5:02 PM   Specimen: Nasopharyngeal Swab; Nasopharyngeal(NP) swabs in vial transport medium  Result Value Ref Range   SARS Coronavirus 2 by RT PCR NEGATIVE NEGATIVE    Comment: (NOTE) SARS-CoV-2 target nucleic acids are NOT DETECTED.  The SARS-CoV-2 RNA is generally detectable in upper respiratory specimens during the acute phase of infection. The lowest concentration of SARS-CoV-2 viral copies this assay can detect is 138 copies/mL. A negative result does not preclude SARS-Cov-2 infection and should not be used as the sole basis for treatment or other patient management decisions. A negative result may occur with  improper specimen collection/handling, submission of specimen other than nasopharyngeal swab, presence of viral mutation(s) within the areas targeted by this assay, and inadequate number of viral copies(<138 copies/mL). A negative result must be combined with clinical observations, patient history, and epidemiological information. The expected result is Negative.  Fact Sheet for Patients:  EntrepreneurPulse.com.au  Fact Sheet for Healthcare Providers:  IncredibleEmployment.be  This test is no t  yet approved or cleared by the [place] FDA and  has been authorized for detection and/or diagnosis of SARS-CoV-2 by FDA under an Emergency Use Authorization (EUA). This EUA will remain  in effect (meaning this test can be used) for the duration of the COVID-19 declaration under Section 564(b)(1) of the Act, 21 U.S.C.section 360bbb-3(b)(1), unless the authorization is terminated  or revoked sooner.       Influenza A by PCR NEGATIVE NEGATIVE   Influenza B by PCR NEGATIVE NEGATIVE    Comment: (NOTE) The Xpert Xpress SARS-CoV-2/FLU/RSV plus assay is intended as an aid in the diagnosis of influenza from Nasopharyngeal swab specimens and should not be used as a sole basis for treatment. Nasal washings and aspirates are unacceptable for Xpert Xpress SARS-CoV-2/FLU/RSV testing.  Fact Sheet for Patients: EntrepreneurPulse.com.au  Fact Sheet for Healthcare Providers: IncredibleEmployment.be  This test is not yet approved or cleared by the [place] FDA and has been authorized for detection and/or diagnosis of SARS-CoV-2 by FDA under an Emergency Use Authorization (EUA). This EUA will remain in effect (meaning  this test can be used) for the duration of the COVID-19 declaration under Section 564(b)(1) of the Act, 21 U.S.C. section 360bbb-3(b)(1), unless the authorization is terminated or revoked.  Performed at [facility], [place], [place] [address]   Troponin I (High Sensitivity)     Status: Abnormal   Collection Time: 02/12/21 10:11 PM  Result Value Ref Range   Troponin I (High Sensitivity) 38 (H) <18 ng/L    Comment: (NOTE) Elevated high sensitivity troponin I (hsTnI) values and significant  changes across serial measurements may suggest ACS but many other  chronic and acute conditions are known to elevate hsTnI results.  Refer to the "Links" section for chest pain algorithms and additional   guidance. Performed at [facility], [address] [place], [place] [address]   Basic metabolic panel     Status: Abnormal   Collection Time: 02/15/21  1:16 PM  Result Value Ref Range   Sodium 143 135 - 145 mEq/L   Potassium 3.9 3.5 - 5.1 mEq/L   Chloride 103 96 - 112 mEq/L   CO2 31 19 - 32 mEq/L   Glucose, Bld 101 (H) 70 - 99 mg/dL   BUN 33 (H) 6 - 23 mg/dL   Creatinine, Ser 1.42 0.40 - 1.50 mg/dL   GFR 46.93 (L) >60.00 mL/min    Comment: Calculated using the CKD-EPI Creatinine Equation (2021)   Calcium 9.1 8.4 - 10.5 mg/dL    DG Chest 2 View  Result Date: 02/12/2021 CLINICAL DATA:  Short of breath, cough EXAM: CHEST - 2 VIEW COMPARISON:  12/29/2016 FINDINGS: Frontal and lateral views of the chest demonstrate stable postsurgical changes from bypass surgery. Cardiac silhouette is stable. Chronic left pleural thickening versus trace left pleural effusion, stable. No acute airspace disease or pneumothorax. No acute bony abnormalities. IMPRESSION: 1. Stable postsurgical changes.  No acute process. Electronically Signed   By: Randa Ngo M.D.   On: 02/12/2021 15:03   CT Angio Chest PE W and/or Wo Contrast  Result Date: 02/12/2021 CLINICAL DATA:  Short of breath for 4 days, cough EXAM: CT ANGIOGRAPHY CHEST WITH CONTRAST TECHNIQUE: Multidetector CT imaging of the chest was performed using the standard protocol during bolus administration of intravenous contrast. Multiplanar CT image reconstructions and MIPs were obtained to evaluate the vascular anatomy. CONTRAST:  7mL OMNIPAQUE IOHEXOL 350 MG/ML SOLN COMPARISON:  02/12/2021, 09/13/2012 FINDINGS: Cardiovascular: This is a technically adequate evaluation of the pulmonary vasculature. There are no filling defects or pulmonary emboli. The heart is unremarkable without pericardial effusion. Postsurgical changes from previous CABG. Normal caliber of the thoracic aorta, with severe atherosclerosis noted throughout the aorta and  coronary vasculature. Mediastinum/Nodes: No enlarged mediastinal, hilar, or axillary lymph nodes. Thyroid gland, trachea, and esophagus demonstrate no significant findings. Lungs/Pleura: Upper lobe predominant emphysema. There is a small amount of pleural fluid loculated within the left major fissure. Otherwise no effusion or pneumothorax. No acute airspace disease. Central airways are patent. Upper Abdomen: No acute abnormality. Musculoskeletal: No acute or destructive bony lesions. Reconstructed images demonstrate no additional findings. Review of the MIP images confirms the above findings. IMPRESSION: 1. No evidence of pulmonary embolus. 2. Small amount of pleural fluid within the left major fissure. 3. Aortic Atherosclerosis (ICD10-I70.0) and Emphysema (ICD10-J43.9). Electronically Signed   By: Randa Ngo M.D.   On: 02/12/2021 18:30     Assessment and Plan:     ICD-10-CM   1. COPD exacerbation ([place])  J44.1  2. Ischemic cardiomyopathy  I25.5     3. Peripheral arterial disease (HCC)  I73.9     4. Elevated brain natriuretic peptide (BNP) level  R79.89     5. Troponin level elevated  R77.8     6. Encounter for long-term current use of high risk medication  H60.737 Basic metabolic panel     Total encounter time: 40 minutes. This includes total time spent on the day of encounter.  Extensive amounts of time reviewing case, and also trying to coordinate verbal communication with cardiology.  Certainly has a COPD exacerbation, seems to have improved with oral steroids, and I am going to extend this.  In a case where the patient's had prior bypass surgery, and he has diffuse extensive and severe peripheral arterial disease I am concerned with elevated troponin as well as elevated cardiac BNP.  I talked about the case with Dr. Rockey Situ, and he graciously offered his opinion.  We think that this does not to be treated emergently, and he is going to help coordinate an office visit this week.  Dr.  Fletcher Anon is his usual cardiologist  Decrease Lasix dosing.  Check BMP today.  Patient Instructions  Decrease your lasix tablet to 1/2 tablet every other day.  If needed or swelling, then you can add in another dose on the other days.   Cardiology will contact to you about an appointment.   Meds ordered this encounter  Medications   predniSONE (DELTASONE) 20 MG tablet    Sig: 2 tabs po daily for 5 days, then 1 tab po daily for 5 days    Dispense:  15 tablet    Refill:  0   Medications Discontinued During This Encounter  Medication Reason   predniSONE (DELTASONE) 50 MG tablet    Orders Placed This Encounter  Procedures   Basic metabolic panel    Follow-up: No follow-ups on file.  [employer] One speech-to-text software was used for transcription in this dictation.  Possible transcriptional errors can occur using Editor, commissioning.   Signed,  Maud Deed. Shere Eisenhart, MD   Outpatient Encounter Medications as of 02/15/2021  Medication Sig   albuterol (VENTOLIN HFA) 108 (90 Base) MCG/ACT inhaler Inhale 2 puffs into the lungs every 6 (six) hours as needed for wheezing or shortness of breath.   amLODipine (NORVASC) 5 MG tablet TAKE 1 TABLET BY MOUTH EVERY DAY   atorvastatin (LIPITOR) 20 MG tablet Take 1 tablet (20 mg total) by mouth daily.   carvedilol (COREG) 6.25 MG tablet TAKE 1 TABLET BY MOUTH TWICE A DAY WITH MEALS   furosemide (LASIX) 40 MG tablet Take 1 tablet (40 mg total) by mouth daily.   levothyroxine (SYNTHROID) 150 MCG tablet TAKE ONE TABLET BY MOUTH EVERY DAY BEFORE BREAKFAST   predniSONE (DELTASONE) 20 MG tablet 2 tabs po daily for 5 days, then 1 tab po daily for 5 days   triamcinolone cream (KENALOG) 0.1 % Apply 1 application topically 2 (two) times daily.   warfarin (COUMADIN) 5 MG tablet TAKE ONE TABLET BY MOUTH EVERY DAY OR AS DIRECTED BY COUMADIN CLINIC   [DISCONTINUED] predniSONE (DELTASONE) 50 MG tablet Take 1 tablet (50 mg total) by mouth daily for 4 days.    [DISCONTINUED] Fluticasone-Salmeterol (ADVAIR DISKUS) 250-50 MCG/DOSE AEPB Inhale 1 puff into the lungs 2 (two) times daily.   [DISCONTINUED] pravastatin (PRAVACHOL) 40 MG tablet Take 1 tablet (40 mg total) by mouth daily.   No facility-administered encounter medications on file as of 02/15/2021.

## 2021-02-15 NOTE — Patient Instructions (Addendum)
Decrease your lasix tablet to 1/2 tablet every other day.  If needed or swelling, then you can add in another dose on the other days.   Cardiology will contact to you about an appointment.

## 2021-02-19 ENCOUNTER — Ambulatory Visit (INDEPENDENT_AMBULATORY_CARE_PROVIDER_SITE_OTHER): Payer: Medicare Other | Admitting: Nurse Practitioner

## 2021-02-19 ENCOUNTER — Encounter: Payer: Self-pay | Admitting: Nurse Practitioner

## 2021-02-19 ENCOUNTER — Other Ambulatory Visit: Payer: Self-pay

## 2021-02-19 VITALS — BP 110/64 | HR 80 | Ht 69.0 in | Wt 176.0 lb

## 2021-02-19 DIAGNOSIS — I5033 Acute on chronic diastolic (congestive) heart failure: Secondary | ICD-10-CM | POA: Diagnosis not present

## 2021-02-19 DIAGNOSIS — E785 Hyperlipidemia, unspecified: Secondary | ICD-10-CM | POA: Diagnosis not present

## 2021-02-19 DIAGNOSIS — I10 Essential (primary) hypertension: Secondary | ICD-10-CM | POA: Diagnosis not present

## 2021-02-19 DIAGNOSIS — N179 Acute kidney failure, unspecified: Secondary | ICD-10-CM | POA: Diagnosis not present

## 2021-02-19 DIAGNOSIS — I739 Peripheral vascular disease, unspecified: Secondary | ICD-10-CM | POA: Diagnosis not present

## 2021-02-19 DIAGNOSIS — I2510 Atherosclerotic heart disease of native coronary artery without angina pectoris: Secondary | ICD-10-CM | POA: Diagnosis not present

## 2021-02-19 DIAGNOSIS — Z86718 Personal history of other venous thrombosis and embolism: Secondary | ICD-10-CM | POA: Diagnosis not present

## 2021-02-19 DIAGNOSIS — I714 Abdominal aortic aneurysm, without rupture, unspecified: Secondary | ICD-10-CM

## 2021-02-19 DIAGNOSIS — I255 Ischemic cardiomyopathy: Secondary | ICD-10-CM | POA: Diagnosis not present

## 2021-02-19 DIAGNOSIS — I701 Atherosclerosis of renal artery: Secondary | ICD-10-CM | POA: Diagnosis not present

## 2021-02-19 DIAGNOSIS — I6523 Occlusion and stenosis of bilateral carotid arteries: Secondary | ICD-10-CM

## 2021-02-19 MED ORDER — FUROSEMIDE 40 MG PO TABS: 20.0000 mg | ORAL_TABLET | ORAL | 2 refills | Status: DC

## 2021-02-19 NOTE — Patient Instructions (Addendum)
Medication Instructions:  Your physician has recommended you make the following change in your medication:   Updated medication list to reflect Furosemide 40 mg and take 1/2 tablet (20 mg) every other day.    *If you need a refill on your cardiac medications before your next appointment, please call your pharmacy*   Lab Work: BMET in one week here in the office.   If you have labs (blood work) drawn today and your tests are completely normal, you will receive your results only by: [place] (if you have MyChart) OR A paper copy in the mail If you have any lab test that is abnormal or we need to change your treatment, we will call you to review the results.   Testing/Procedures: Your physician has requested that you have an echocardiogram in one week when you come to get labs done. (Use same day slot per CB)   Echocardiography is a painless test that uses sound waves to create images of your heart. It provides your doctor with information about the size and shape of your heart and how well your heart's chambers and valves are working. This procedure takes approximately one hour. There are no restrictions for this procedure.    Follow-Up: At [facility], you and your health needs are our priority.  As part of our continuing mission to provide you with exceptional heart care, we have created designated Provider Care Teams.  These Care Teams include your primary Cardiologist (physician) and Advanced Practice Providers (APPs -  Physician Assistants and Nurse Practitioners) who all work together to provide you with the care you need, when you need it.   Your next appointment:   Follow up after echocardiogram has been done  The format for your next appointment:   In Person  Provider:   [name], MD or Murray Hodgkins, NP

## 2021-02-19 NOTE — Progress Notes (Signed)
Office Visit    Patient Name: [name] Date of Encounter: 02/19/2021  Primary Care Provider:  Owens Loffler, MD Primary Cardiologist:  [name], MD  Chief Complaint    80 year old male with a history of peripheral arterial disease, renal artery stenosis, carotid arterial disease, tobacco abuse, COPD, abdominal aortic aneurysm, intracranial hemorrhage in the setting of malignant hypertension, hyperlipidemia, bilateral lower extremity DVTs, heart failure with improved ejection fraction/ischemic cardiomyopathy, and CAD status post CABG x4 (March 2013), who presents for follow-up related to dyspnea.  Past Medical History    Past Medical History:  Diagnosis Date   ([place]) heart failure with improved ejection fraction ([place])    a. 06/2011 Echo: EF 35-40%; b. 06/2012 Echo: EF 50%; c. 12/2016 Echo: EF 55-60%, no rwma, GRI DD, mild AS/MR, nl RV fxn, nl RVSP.   AAA (abdominal aortic aneurysm)    a. 05/2020 Abd u/s: 3.6cm.   Aortic calcification ([facility]) 05/01/2013   Bilateral renal artery stenosis ([place])    a. 06/2013 Angio/PTA: LRA: 95 (5x18 Herculink stent), RRA 60ost, celiac/SMA nl, RCIA patent stents extending into dist Ao, RIIA 70ost, REIA min irregs, LCIA patent stent, LIIA 60-70ost, LEIA min irregs.   Coronary artery disease    a. 2013 s/p CABG x 3 (LIMA->LAD, VG->OM, VG->RPDA; b. 06/2012 MV: no ischemia.   History of prior cigarette smoking 04/11/2008   Qualifier: Diagnosis of  By: Diona Browner MD, Amy     Hypertension    Hypothyroidism    Intraventricular hemorrhage ([place]) 07/12/2012   Ischemic cardiomyopathy    a. 06/2011 Echo: EF 35-40%; b. 06/2012 Echo: EF 50%; c. 12/2016 Echo: EF 55-60%, no rwma, GRI DD.   Peripheral arterial disease ([place])    a. Previous left lower extremity stenting by Dr. Jamal Collin;  b. 12/2012 s/p bilat ostial common iliac stenting; c. 05/2020 ABIs: R 0.74, L 0.73-->Stable. Patent bilat common/ext iliacs by duplex.   Right leg DVT ([place]) 08/13/2012   Subclavian  artery stenosis, left ([place]) 05/2012   Status post stenting of the ostium and self-expanding stent placement to the left axillary artery   Venous insufficiency    Past Surgical History:  Procedure Laterality Date   ABDOMINAL ANGIOGRAM  07/10/2013   WITH BI-FEMORAL RUNOFF       DR Fletcher Anon   ABDOMINAL AORTAGRAM N/A 12/26/2012   Procedure: ABDOMINAL Maxcine Ham;  Surgeon: Wellington Hampshire, MD;  Location: [facility] CATH LAB;  Service: Cardiovascular;  Laterality: N/A;   ABDOMINAL AORTAGRAM N/A 07/10/2013   Procedure: ABDOMINAL Maxcine Ham;  Surgeon: Wellington Hampshire, MD;  Location: [facility] CATH [facility];  Service: Cardiovascular;  Laterality: N/A;   ANGIOPLASTY / STENTING FEMORAL  05/2012   ANGIOPLASTY / STENTING ILIAC Bilateral 12/26/2012   ARCH AORTOGRAM N/A 06/20/2012   Procedure: ARCH AORTOGRAM;  Surgeon: Wellington Hampshire, MD;  Location: [facility] CATH [facility];  Service: Cardiovascular;  Laterality: N/A;   CARDIAC CATHETERIZATION  2/14   [place]   CHOLECYSTECTOMY  1990's   CORONARY ARTERY BYPASS GRAFT  07/11/2011   Procedure: CORONARY ARTERY BYPASS GRAFTING (CABG);  Surgeon: Grace Isaac, MD;  Location: [facility];  Service: Open Heart Surgery;  Laterality: N/A;  Times 3. On Pump. Using right greater saphenous vein and left internal mammary artery.    HERNIA REPAIR     INSERTION OF ILIAC STENT Bilateral 12/26/2012   Procedure: INSERTION OF ILIAC STENT;  Surgeon: Wellington Hampshire, MD;  Location: [facility] CATH [facility];  Service: Cardiovascular;  Laterality: Bilateral;  Bilateral Common Iliac  Artery   LEFT AND RIGHT HEART CATHETERIZATION WITH CORONARY ANGIOGRAM  07/07/2011   Procedure: LEFT AND RIGHT HEART CATHETERIZATION WITH CORONARY ANGIOGRAM;  Surgeon: Minna Merritts, MD;  Location: [facility] CATH [facility];  Service: Cardiovascular;;   LOWER EXTREMITY VENOGRAPHY Bilateral 12/30/2016   Procedure: Lower Extremity Venography;  Surgeon: Katha Cabal, MD;  Location: [facility] CV LAB;  Service: Cardiovascular;  Laterality: Bilateral;   PERCUTANEOUS STENT  INTERVENTION  06/20/2012   Procedure: PERCUTANEOUS STENT INTERVENTION;  Surgeon: Wellington Hampshire, MD;  Location: [facility] CATH LAB;  Service: Cardiovascular;;   RENAL ARTERY ANGIOPLASTY Left 07/10/2013   DR ARIDA   SUBCLAVIAN ARTERY STENT  05/2012   "2 stents" (12/26/2012)   TOOTH EXTRACTION  Spring 2017   mulitple (6)   VENA CAVA FILTER PLACEMENT  07/2012   Removable    Allergies  Allergies  Allergen Reactions   Plavix [Clopidogrel Bisulfate]     Brain hemorrhage prev while on plavix and aspirin    History of Present Illness    80 year old male with above complex past medical history including peripheral arterial disease, renal artery stenosis, carotid arterial disease, tobacco abuse, COPD, abdominal aortic aneurysm, intracranial hemorrhage in the setting of malignant hypertension, hyperlipidemia, bilateral lower extremity DVTs, heart failure with improved ejection fraction/ischemic cardiomyopathy, and CAD status post CABG x4.  He has previously undergone stenting of the left subclavian and left axillary arteries as well as bilateral common iliac and left renal arteries.  As noted above, in March 2013, he underwent CABG x3 with placement of a LIMA to the LAD, vein graft to the PDA, and vein graft to the obtuse marginal.  He suffered an intracranial hemorrhage in 2014 which was suspected to be due to malignant hypertension and dual antiplatelet therapy.  Shortly thereafter, he was diagnosed with extensive right-sided DVT.  Anticoagulation was contraindicated and thus an IVC filter was placed.  In 2018, he had extensive bilateral occlusive DVTs which required catheter-based therapy and he has been on anticoagulation since then.  Of note, his EF was previously as low as 35 to 40% prior to his bypass surgery with subsequent improvement to 55 to 60% by echo in September 2018.  Mr. Backlund was last seen in cardiology clinic in February of this year, at which time he was doing well.  Unfortunately, sometime  during the late summer, he began to notice more fatigue, stating that he has had trouble "getting the ball rolling" but once he is up and about, he does fine.  Activity is limited by chronic leg fatigue and weakness, which he says has been present for many years but has slowly gotten worse.  He feels as though his leg muscles have weakened in the setting of inactivity and often feel "rubbery."  This resulted in unsteady gait at times.  He has chronic bilateral calf claudication, which he notes has been stable dating back several years.  Sometime in September, he started noticing increasing dyspnea on exertion.  He and his wife went to the beach for a week in late September, where his dyspnea seem to worsen.  He also developed a rash on his forearms and his thighs which required outpatient treatment with topical steroids prior to resolving.  Unfortunately, dyspnea progressed prompting him to present to urgent care and not to the 21st.  There, chest x-ray was unremarkable.  He was referred to the emergency department for further evaluation. His BNP was 632.7, while his HsTrops were mildly elev @ 39  38.  CT angio of the chest was negative for PE.  He was noted to have a small amount of pleural fluid within the left major fissure.  Emphysema was noted.  Recommendation was made for admission and diuresis however patient refused.  He was discharged home on Lasix, albuterol, and steroids.  At primary care follow-up on October 24, improvement on steroid and diuretic therapy was noted.  Follow-up labs that day showed rise in creatinine to 1.42 with a BUN of 33.  His furosemide dose was reduced to 20 mg every other day.  Since then, he has continued to note dyspnea on exertion.  He remains on a prednisone taper for a few more days.  He has not had any wheezing and feels as though his chest congestion and cough have improved as well.  He denies any prior or recent history of chest pain/pressure/tightness/squeezing/burning.   He also notes that his prior anginal equivalent before bypass surgery was lower extremity edema and heart failure.  He denies any weight gain, edema, PND, orthopnea, dizziness, syncope, or early satiety.  Home Medications    Current Outpatient Medications  Medication Sig Dispense Refill   albuterol (VENTOLIN HFA) 108 (90 Base) MCG/ACT inhaler Inhale 2 puffs into the lungs every 6 (six) hours as needed for wheezing or shortness of breath. 8 g 2   amLODipine (NORVASC) 5 MG tablet TAKE 1 TABLET BY MOUTH EVERY DAY 90 tablet 3   atorvastatin (LIPITOR) 20 MG tablet Take 1 tablet (20 mg total) by mouth daily. 90 tablet 0   carvedilol (COREG) 6.25 MG tablet TAKE 1 TABLET BY MOUTH TWICE A DAY WITH MEALS 180 tablet 3   levothyroxine (SYNTHROID) 150 MCG tablet TAKE ONE TABLET BY MOUTH EVERY DAY BEFORE BREAKFAST 90 tablet 3   predniSONE (DELTASONE) 20 MG tablet 2 tabs po daily for 5 days, then 1 tab po daily for 5 days 15 tablet 0   triamcinolone cream (KENALOG) 0.1 % Apply 1 application topically 2 (two) times daily. 30 g 1   warfarin (COUMADIN) 5 MG tablet TAKE ONE TABLET BY MOUTH EVERY DAY OR AS DIRECTED BY COUMADIN CLINIC 90 tablet 1   furosemide (LASIX) 40 MG tablet Take 0.5 tablets (20 mg total) by mouth every other day. 23 tablet 2   No current facility-administered medications for this visit.     Review of Systems    Reduced energy over the past several months with chronic but slowly progressive leg weakness and unsteady gait.  He has chronic bilateral calf claudication, which has been stable.  Dyspnea on exertion dating back to sometime in September which has improved on diuretic and prednisone therapy.  He denies chest pain, palpitations, PND, orthopnea, dizziness, syncope, edema, or early satiety.  All other systems reviewed and are otherwise negative except as noted above.  Physical Exam    VS:  BP 110/64   Pulse 80   Ht 5\' 9"  (1.753 m)   Wt 176 lb (79.8 kg)   SpO2 98%   BMI 25.99 kg/m   , BMI Body mass index is 25.99 kg/m.     GEN: Well nourished, well developed, in no acute distress. HEENT: normal. Neck: Supple, no JVD, carotid bruits, or masses. Cardiac: RRR, no murmurs, rubs, or gallops. No clubbing, cyanosis, edema.  Radials 2+/PT 1+ and equal bilaterally.  Respiratory:  Respirations regular and unlabored, clear to auscultation bilaterally. GI: Soft, nontender, nondistended, BS + x 4. MS: no deformity or atrophy. Skin: warm and dry, no rash.  Neuro:  Strength and sensation are intact. Psych: Normal affect.  Accessory Clinical Findings    ECG personally reviewed by me today -regular sinus rhythm, 80, PAC.  ST/T abnormalities- no acute changes.  Lab Results  Component Value Date   WBC 7.6 02/12/2021   HGB 16.5 02/12/2021   HCT 51.0 02/12/2021   MCV 90.6 02/12/2021   PLT 210 02/12/2021   Lab Results  Component Value Date   CREATININE 1.42 02/15/2021   BUN 33 (H) 02/15/2021   NA 143 02/15/2021   K 3.9 02/15/2021   CL 103 02/15/2021   CO2 31 02/15/2021   Lab Results  Component Value Date   ALT 11 02/12/2021   AST 16 02/12/2021   ALKPHOS 99 02/12/2021   BILITOT 1.5 (H) 02/12/2021   Lab Results  Component Value Date   CHOL 131 04/30/2020   HDL 61.90 04/30/2020   LDLCALC 50 04/30/2020   TRIG 92.0 04/30/2020   CHOLHDL 2 04/30/2020    Lab Results  Component Value Date   HGBA1C 5.3 04/30/2020   Lab Results  Component Value Date   INR 2.0 (H) 02/12/2021   INR 2.8 01/29/2021   INR 1.7 (A) 12/18/2020    Assessment & Plan    1.  Acute on chronic heart failure with improved ejection fraction/ischemic cardiomyopathy: Patient with a history of LV dysfunction and an EF of 35 to 40% by echo prior to his bypass surgery.  Ejection fraction subsequently improved and was 55 to 60% by echo in September 2018.  In September 2022, patient began to experience progressive dyspnea on exertion prompting urgent care/ED visit last week.  He was found to have an  elevated BNP in the 600 range with mildly elevated high-sensitivity troponins of 39  38.  CTA of the chest was negative for PE but did show a small amount of fluid.  He was offered admission for IV diuresis but declined.  He was sent home on prednisone and Lasix 40 mg daily.  He noted significant improvement on this regimen.  Follow-up labs on October 24 showed a rise in BUN and creatinine to 33 and 1.42 and Lasix dose was reduced to 20 mg every other day.  He has not had any recurrence of dyspnea.  He continues to note low energy which dates back to at least sometime in August.  He is euvolemic on examination and his weight over the past 5 years is relatively unchanged.  Heart rate and blood pressure are stable.  ECG unchanged from prior.  Going to arrange for an echocardiogram to reevaluate LV function.  If EF is normal, in the setting of mild troponin elevation, we will also plan to pursue a Lexiscan Myoview.  If EF is abnormal however, patient will require diagnostic catheterization.  We discussed this in detail today.  Advised him to continue his current dose of Lasix and I will plan to follow-up a basic metabolic panel in 1 week.  He otherwise remains on beta-blocker therapy.  2.  Coronary artery disease/elevated high-sensitivity troponin/demand ischemia: Status post CABG x3 in 2013.  He had a nonischemic Myoview in March 2014.  As noted above, recently evaluated due to worsening dyspnea with high sensitive troponins mildly elevated at 39  38.  He has not had any chest pain and notes that prior anginal equivalent was dyspnea and lower extremity edema.  We will arrange for 2D echocardiogram as outlined above with plan for ischemic evaluation pending ejection fraction.  Continue beta-blocker, statin,  and calcium channel blocker therapy.  No aspirin in the setting of chronic warfarin.  3.  Essential hypertension: Stable on beta-blocker and calcium channel blocker.  4.  Hyperlipidemia: LDL of 50 in January  of this year with normal AST and ALT on atorvastatin therapy.  5.  Peripheral arterial disease: Patient with known carotid, renal artery, common iliac, left subclavian, and left axillary artery disease.  Aortic/lower extremity duplex, ABIs and carotid duplex were stable in February 2022 and he is orders in place for follow-up next year.  He has chronic bilateral calf claudication which is unchanged.  Continue statin.  No aspirin in the setting of chronic warfarin.  6.  History of bilateral lower extremity DVTs: On chronic warfarin with therapeutic INR of 2.0 October 21.  He is followed here in our Coumadin clinic.  7.  Abdominal aortic aneurysm: This was stable at 3.6 cm on abdominal ultrasound in February 2022.  Plan for follow-up early next year.  8.  AKI:  As above, creat rose to 1.42 on lasix 40 daily and dose reduced to 20 mg every other day.  F/u bmet in 1 wk.  9.  Disposition: Follow-up 2D echocardiogram.  Follow-up basic metabolic panel in 1 week.  Follow-up in clinic within 1 week of echo.   Murray Hodgkins, NP 02/19/2021, 12:29 PM

## 2021-02-24 NOTE — Addendum Note (Signed)
Addended by: Britt Bottom on: 02/24/2021 10:26 AM   Modules accepted: Orders

## 2021-02-26 ENCOUNTER — Ambulatory Visit (INDEPENDENT_AMBULATORY_CARE_PROVIDER_SITE_OTHER): Payer: Medicare Other

## 2021-02-26 ENCOUNTER — Other Ambulatory Visit (INDEPENDENT_AMBULATORY_CARE_PROVIDER_SITE_OTHER): Payer: Medicare Other

## 2021-02-26 ENCOUNTER — Other Ambulatory Visit: Payer: Self-pay

## 2021-02-26 DIAGNOSIS — I5033 Acute on chronic diastolic (congestive) heart failure: Secondary | ICD-10-CM | POA: Diagnosis not present

## 2021-02-26 DIAGNOSIS — I10 Essential (primary) hypertension: Secondary | ICD-10-CM

## 2021-02-26 DIAGNOSIS — I2510 Atherosclerotic heart disease of native coronary artery without angina pectoris: Secondary | ICD-10-CM | POA: Diagnosis not present

## 2021-02-27 LAB — BASIC METABOLIC PANEL
BUN/Creatinine Ratio: 20 (ref 10–24)
BUN: 33 mg/dL — ABNORMAL HIGH (ref 8–27)
CO2: 26 mmol/L (ref 20–29)
Calcium: 9.3 mg/dL (ref 8.6–10.2)
Chloride: 101 mmol/L (ref 96–106)
Creatinine, Ser: 1.61 mg/dL — ABNORMAL HIGH (ref 0.76–1.27)
Glucose: 66 mg/dL — ABNORMAL LOW (ref 70–99)
Potassium: 4.4 mmol/L (ref 3.5–5.2)
Sodium: 143 mmol/L (ref 134–144)
eGFR: 43 mL/min/{1.73_m2} — ABNORMAL LOW (ref >59)

## 2021-03-01 ENCOUNTER — Telehealth: Payer: Self-pay | Admitting: *Deleted

## 2021-03-01 DIAGNOSIS — I2510 Atherosclerotic heart disease of native coronary artery without angina pectoris: Secondary | ICD-10-CM

## 2021-03-01 LAB — ECHOCARDIOGRAM COMPLETE
AR max vel: 0.81 cm2
AV Area VTI: 0.79 cm2
AV Area mean vel: 0.7 cm2
AV Mean grad: 12.7 mmHg
AV Peak grad: 21.8 mmHg
Ao pk vel: 2.33 m/s
Area-P 1/2: 3.39 cm2
Calc EF: 58.3 %
S' Lateral: 3.5 cm
Single Plane A2C EF: 63.8 %
Single Plane A4C EF: 54.1 %

## 2021-03-01 MED ORDER — FUROSEMIDE 40 MG PO TABS: 20.0000 mg | ORAL_TABLET | ORAL | 2 refills | Status: DC | PRN

## 2021-03-01 NOTE — Telephone Encounter (Signed)
-----   Message from Theora Gianotti, NP sent at 03/01/2021  8:35 AM EST ----- Kidneys now drier w/ creatinine of 1.61.  He is currently taking lasix 20mg  every other day.  With this finding, I'd like him to use lasix 20mg  on an as needed basis only - Lasix 20mg  Daily PRN for wt gain of 2 lbs in 24 hrs or 5 lbs over the course of a week. F/u bmet in 1 wk.

## 2021-03-01 NOTE — Telephone Encounter (Signed)
Reviewed results and recommendations with patients wife per release form. We discussed when to take the 1/2 tablet of Furosemide with weight gain and repeat labs. She confirmed he has appointment next week with provider and advised we can do his repeat labs at that time. She verbalized understanding of our conversation, instructions, and follow up labs with appointment. She repeated all instructions and had no further questions at this time.

## 2021-03-05 ENCOUNTER — Ambulatory Visit (INDEPENDENT_AMBULATORY_CARE_PROVIDER_SITE_OTHER): Payer: Medicare Other

## 2021-03-05 ENCOUNTER — Other Ambulatory Visit: Payer: Self-pay

## 2021-03-05 DIAGNOSIS — Z7901 Long term (current) use of anticoagulants: Secondary | ICD-10-CM

## 2021-03-05 LAB — POCT INR: INR: 2.9 (ref 2.0–3.0)

## 2021-03-05 NOTE — Progress Notes (Signed)
Continue weekly dose to take 1 tablet daily except take 1/2 tablet on Sunday, Tuesday, Thursdays, and Saturdays.  Re-check in 5 weeks per patient request.

## 2021-03-05 NOTE — Patient Instructions (Addendum)
Pre visit review using our clinic review tool, if applicable. No additional management support is needed unless otherwise documented below in the visit note.  Continue weekly dose to take 1 tablet daily except take 1/2 tablet on Sunday, Tuesday, Thursdays, and Saturdays.  Re-check in 5 weeks per patient request.

## 2021-03-09 ENCOUNTER — Other Ambulatory Visit: Payer: Self-pay

## 2021-03-09 ENCOUNTER — Encounter: Payer: Self-pay | Admitting: Nurse Practitioner

## 2021-03-09 ENCOUNTER — Ambulatory Visit (INDEPENDENT_AMBULATORY_CARE_PROVIDER_SITE_OTHER): Payer: Medicare Other | Admitting: Nurse Practitioner

## 2021-03-09 VITALS — BP 142/72 | HR 72 | Ht 69.0 in | Wt 178.2 lb

## 2021-03-09 DIAGNOSIS — Z86718 Personal history of other venous thrombosis and embolism: Secondary | ICD-10-CM | POA: Diagnosis not present

## 2021-03-09 DIAGNOSIS — E785 Hyperlipidemia, unspecified: Secondary | ICD-10-CM

## 2021-03-09 DIAGNOSIS — I350 Nonrheumatic aortic (valve) stenosis: Secondary | ICD-10-CM

## 2021-03-09 DIAGNOSIS — Z01812 Encounter for preprocedural laboratory examination: Secondary | ICD-10-CM

## 2021-03-09 DIAGNOSIS — I10 Essential (primary) hypertension: Secondary | ICD-10-CM | POA: Diagnosis not present

## 2021-03-09 DIAGNOSIS — Z01818 Encounter for other preprocedural examination: Secondary | ICD-10-CM

## 2021-03-09 DIAGNOSIS — I255 Ischemic cardiomyopathy: Secondary | ICD-10-CM | POA: Diagnosis not present

## 2021-03-09 DIAGNOSIS — I714 Abdominal aortic aneurysm, without rupture, unspecified: Secondary | ICD-10-CM

## 2021-03-09 DIAGNOSIS — I739 Peripheral vascular disease, unspecified: Secondary | ICD-10-CM

## 2021-03-09 DIAGNOSIS — I2510 Atherosclerotic heart disease of native coronary artery without angina pectoris: Secondary | ICD-10-CM | POA: Diagnosis not present

## 2021-03-09 DIAGNOSIS — N179 Acute kidney failure, unspecified: Secondary | ICD-10-CM | POA: Diagnosis not present

## 2021-03-09 NOTE — H&P (View-Only) (Signed)
To  Cardiology Clinic Note   Patient Name: SURAFEL HILLEARY Date of Encounter: 03/09/2021  Primary Care Provider:  Owens Loffler, MD Primary Cardiologist:  [name], MD  Patient Profile    80 year old male with a history of peripheral arterial disease, renal artery stenosis, carotid arterial disease, tobacco abuse, COPD, abdominal aortic aneurysm, intracranial hemorrhage in the setting of malignant hypertension, hyperlipidemia, bilateral lower extremity DVTs, heart failure with improved ejection fraction/ischemic cardiomyopathy, and CAD status post CABG x4 (March 2013), who presents for follow-up related to dyspnea and finding of severe aortic stenosis on echocardiogram.  Past Medical History    Past Medical History:  Diagnosis Date   ([place]) heart failure with improved ejection fraction ([place])    a. 06/2011 Echo: EF 35-40%; b. 06/2012 Echo: EF 50%; c. 12/2016 Echo: EF 55-60%, no rwma, GRI DD, mild AS/MR, nl RV fxn, nl RVSP; d. 02/2021 Echo: EF 55%, GrII DD. Mild MR. Mild to mod TR. Sev AS by VTI.   AAA (abdominal aortic aneurysm)    a. 05/2020 Abd u/s: 3.6cm.   Aortic calcification ([facility]) 05/01/2013   Aortic stenosis    a. 02/2021 Echo: EF 55%, GrII DD. Sev Ca2+ of AoV. Sev AS w/ AVA by VTI 0.79cm^2.   Bilateral renal artery stenosis ([place])    a. 06/2013 Angio/PTA: LRA: 95 (5x18 Herculink stent), RRA 60ost, celiac/SMA nl, RCIA patent stents extending into dist Ao, RIIA 70ost, REIA min irregs, LCIA patent stent, LIIA 60-70ost, LEIA min irregs.   Coronary artery disease    a. 2013 s/p CABG x 3 (LIMA->LAD, VG->OM, VG->RPDA; b. 06/2012 MV: no ischemia.   History of prior cigarette smoking 04/11/2008   Qualifier: Diagnosis of  By: Diona Browner MD, Amy     Hypertension    Hypothyroidism    Intraventricular hemorrhage ([place]) 07/12/2012   Ischemic cardiomyopathy    a. 06/2011 Echo: EF 35-40%; b. 06/2012 Echo: EF 50%; c. 12/2016 Echo: EF 55-60%, no rwma, GRI DD.   Peripheral arterial disease ([place])     a. Previous left lower extremity stenting by Dr. Jamal Collin;  b. 12/2012 s/p bilat ostial common iliac stenting; c. 05/2020 ABIs: R 0.74, L 0.73-->Stable. Patent bilat common/ext iliacs by duplex.   Right leg DVT ([place]) 08/13/2012   Subclavian artery stenosis, left ([place]) 05/2012   Status post stenting of the ostium and self-expanding stent placement to the left axillary artery   Venous insufficiency    Past Surgical History:  Procedure Laterality Date   ABDOMINAL ANGIOGRAM  07/10/2013   WITH BI-FEMORAL RUNOFF       DR Fletcher Anon   ABDOMINAL AORTAGRAM N/A 12/26/2012   Procedure: ABDOMINAL Maxcine Ham;  Surgeon: Wellington Hampshire, MD;  Location: [facility] CATH LAB;  Service: Cardiovascular;  Laterality: N/A;   ABDOMINAL AORTAGRAM N/A 07/10/2013   Procedure: ABDOMINAL Maxcine Ham;  Surgeon: Wellington Hampshire, MD;  Location: [facility] CATH [facility];  Service: Cardiovascular;  Laterality: N/A;   ANGIOPLASTY / STENTING FEMORAL  05/2012   ANGIOPLASTY / STENTING ILIAC Bilateral 12/26/2012   ARCH AORTOGRAM N/A 06/20/2012   Procedure: ARCH AORTOGRAM;  Surgeon: Wellington Hampshire, MD;  Location: [facility] CATH LAB;  Service: Cardiovascular;  Laterality: N/A;   CARDIAC CATHETERIZATION  2/14   [place]   CHOLECYSTECTOMY  1990's   CORONARY ARTERY BYPASS GRAFT  07/11/2011   Procedure: CORONARY ARTERY BYPASS GRAFTING (CABG);  Surgeon: Grace Isaac, MD;  Location: [facility];  Service: Open Heart Surgery;  Laterality: N/A;  Times 3. On Pump. Using right greater  saphenous vein and left internal mammary artery.    HERNIA REPAIR     INSERTION OF ILIAC STENT Bilateral 12/26/2012   Procedure: INSERTION OF ILIAC STENT;  Surgeon: Wellington Hampshire, MD;  Location: [facility] CATH LAB;  Service: Cardiovascular;  Laterality: Bilateral;  Bilateral Common Iliac Artery   LEFT AND RIGHT HEART CATHETERIZATION WITH CORONARY ANGIOGRAM  07/07/2011   Procedure: LEFT AND RIGHT HEART CATHETERIZATION WITH CORONARY ANGIOGRAM;  Surgeon: Minna Merritts, MD;  Location: [facility] CATH LAB;  Service:  Cardiovascular;;   LOWER EXTREMITY VENOGRAPHY Bilateral 12/30/2016   Procedure: Lower Extremity Venography;  Surgeon: Katha Cabal, MD;  Location: [facility] CV LAB;  Service: Cardiovascular;  Laterality: Bilateral;   PERCUTANEOUS STENT INTERVENTION  06/20/2012   Procedure: PERCUTANEOUS STENT INTERVENTION;  Surgeon: Wellington Hampshire, MD;  Location: [facility] CATH LAB;  Service: Cardiovascular;;   RENAL ARTERY ANGIOPLASTY Left 07/10/2013   DR ARIDA   SUBCLAVIAN ARTERY STENT  05/2012   "2 stents" (12/26/2012)   TOOTH EXTRACTION  Spring 2017   mulitple (6)   VENA CAVA FILTER PLACEMENT  07/2012   Removable    Allergies  Allergies  Allergen Reactions   Plavix [Clopidogrel Bisulfate]     Brain hemorrhage prev while on plavix and aspirin    History of Present Illness    80 year old male with above complex past medical history including peripheral arterial disease, renal artery stenosis, carotid arterial disease, tobacco abuse, COPD, abdominal aortic aneurysm, intracranial hemorrhage in the setting of malignant hypertension, hyperlipidemia, bilateral lower extremity DVTs, heart failure with improved ejection fraction/ischemic cardiomyopathy, and CAD status post CABG x4.  He has previously undergone stenting of the left subclavian and left axillary arteries as well as bilateral common iliac and left renal arteries.  As noted above, in March 2013, he underwent CABG x3 with placement of a LIMA to the LAD, vein graft to the PDA, and vein graft to the obtuse marginal.  He suffered an intracranial hemorrhage in 2014 which was suspected to be due to malignant hypertension and dual antiplatelet therapy.  Shortly thereafter, he was diagnosed with extensive right-sided DVT.  Anticoagulation was contraindicated and thus an IVC filter was placed.  In 2018, he had extensive bilateral occlusive DVTs which required catheter-based therapy and he has been on anticoagulation since then.  Of note, his EF was previously as  low as 35 to 40% prior to his bypass surgery with subsequent improvement to 55 to 60% by echo in September 2018.  Mr. Alfieri was last seen in cardiology clinic on October 28 with a 1 month history of dyspnea on exertion on the background of a several month history of fatigue and chronic bilateral calf claudication, which was stable.  Due to worsening dyspnea, he was seen at urgent care on October 21.  Chest x-ray was unremarkable.  He was referred to the emergency department for further evaluation.  BNP was 632.7 while troponins were mildly elevated at 39  38.  CT angio of the chest was negative for PE.  He was noted to have a small amount of pleural fluid within the left major fissure and after he refused admission, was discharged home on Lasix, albuterol, and steroids.  He had some improvement on steroid and diuretic therapy and required de-escalation of furosemide dose to 20 mg every other day in the setting of a rising BUN and creatinine (33/1.4 to on October 24).  He had not been experiencing any chest pain.  Follow-up labs November 4 showed  further rise in creatinine to 1.61 with a BUN of 33.  He was advised to discontinue Lasix and use only as needed for weight gain of 2 pounds or more.  He has not required any additional Lasix.  I obtained a 2D echocardiogram, which showed an EF of 55% with grade 2 diastolic dysfunction, mild MR, mild to moderate TR, and severe aortic stenosis by VTI with a valve area of 0.79 cm.  Mild aortic root dilatation of 37 mm was also noted.  In the setting of suggestion of severe aortic stenosis with progressive dyspnea and known CAD with mild troponin elevation at ER visit, patient was contacted for follow-up to arrange right and left heart diagnostic catheterization.  Since his last visit, he has had persistent fatigue and dyspnea with minimal activity.  He again denies chest pain.  He has chronic bilateral calf claudication, which has been stable.  He denies palpitations, PND,  orthopnea, dizziness, syncope, edema, or early satiety.  Home Medications    Current Outpatient Medications  Medication Sig Dispense Refill   amLODipine (NORVASC) 5 MG tablet TAKE 1 TABLET BY MOUTH EVERY DAY 90 tablet 3   atorvastatin (LIPITOR) 20 MG tablet Take 1 tablet (20 mg total) by mouth daily. (Patient taking differently: Take 20 mg by mouth every evening.) 90 tablet 0   carvedilol (COREG) 6.25 MG tablet TAKE 1 TABLET BY MOUTH TWICE A DAY WITH MEALS 180 tablet 3   furosemide (LASIX) 40 MG tablet Take 0.5 tablets (20 mg total) by mouth as needed (As needed for weight gain of 2 pounds in 24 hours or 5 pounds in one week.). 23 tablet 2   levothyroxine (SYNTHROID) 150 MCG tablet TAKE ONE TABLET BY MOUTH EVERY DAY BEFORE BREAKFAST 90 tablet 3   triamcinolone cream (KENALOG) 0.1 % Apply 1 application topically 2 (two) times daily. (Patient taking differently: Apply 1 application topically 2 (two) times daily as needed (skin rash/irritation).) 30 g 1   warfarin (COUMADIN) 5 MG tablet TAKE ONE TABLET BY MOUTH EVERY DAY OR AS DIRECTED BY COUMADIN CLINIC (Patient taking differently: Take 2.5-5 mg by mouth See admin instructions. Take 0.5 tablet (2.5 mg) by mouth on Sundays, Tuesdays, Thursdays & Saturdays. Take 1 tablet (5 mg) by mouth on Mondays, Wednesdays & Fridays.) 90 tablet 1   No current facility-administered medications for this visit.     Family History    Family History  Problem Relation Age of Onset   Alcohol abuse Father    Cirrhosis Father    Hypothyroidism Sister    He indicated that his mother is deceased. He indicated that his father is deceased. He indicated that the status of his sister is unknown.  Social History    Social History   Socioeconomic History   Marital status: Married    Spouse name: Not on file   Number of children: 2   Years of education: Not on file   Highest education level: Not on file  Occupational History   Occupation: Retired    Fish farm manager:  RETIRED    Comment: [employer]  Tobacco Use   Smoking status: Former    Packs/day: 1.00    Years: 50.00    Pack years: 50.00    Types: Cigarettes    Quit date: 12/25/2010    Years since quitting: 10.2   Smokeless tobacco: Never  Vaping Use   Vaping Use: Never used  Substance and Sexual Activity   Alcohol use: No   Drug use: No  Sexual activity: Not Currently  Other Topics Concern   Not on file  Social History Narrative   Exercising 3 times a week.    Moderate diet control.    O living will, no HCPOA.   Social Determinants of Health   Financial Resource Strain: Not on file  Food Insecurity: Not on file  Transportation Needs: Not on file  Physical Activity: Not on file  Stress: Not on file  Social Connections: Not on file  Intimate Partner Violence: Not on file     Review of Systems    General: Fatigue.  No chills, fever, night sweats or weight changes.  Cardiovascular:  No chest pain, +++ dyspnea on exertion, no edema, orthopnea, palpitations, paroxysmal nocturnal dyspnea. Dermatological: No rash, lesions/masses Respiratory: No cough, +++ dyspnea Urologic: No hematuria, dysuria Abdominal:   No nausea, vomiting, diarrhea, bright red blood per rectum, melena, or hematemesis Neurologic:  No visual changes, wkns, changes in mental status. All other systems reviewed and are otherwise negative except as noted above.    Physical Exam    VS:  BP (!) 142/72 (BP Location: Left Arm, Patient Position: Sitting, Cuff Size: Normal)   Pulse 72   Ht 5\' 9"  (1.753 m)   Wt 178 lb 4 oz (80.9 kg)   SpO2 98%   BMI 26.32 kg/m  , BMI Body mass index is 26.32 kg/m.     GEN: Well nourished, well developed, in no acute distress. HEENT: normal. Neck: Supple, no JVD, carotid bruits, or masses. Cardiac: RRR, no murmurs, rubs, or gallops. No clubbing, cyanosis, edema.  Radials 2+/PT 1+ and equal bilaterally.  Respiratory:  Respirations regular and unlabored, clear to auscultation  bilaterally. GI: Soft, nontender, nondistended, BS + x 4. MS: no deformity or atrophy. Skin: warm and dry, no rash. Neuro:  Strength and sensation are intact. Psych: Normal affect.  Accessory Clinical Findings    ECG personally reviewed by me today-regular sinus rhythm, rate inaccurate in the setting of baseline artifact.  PVCs appear to be present- No acute changes  Lab Results  Component Value Date   WBC 7.6 02/12/2021   HGB 16.5 02/12/2021   HCT 51.0 02/12/2021   MCV 90.6 02/12/2021   PLT 210 02/12/2021   Lab Results  Component Value Date   CREATININE 1.61 (H) 02/26/2021   BUN 33 (H) 02/26/2021   NA 143 02/26/2021   K 4.4 02/26/2021   CL 101 02/26/2021   CO2 26 02/26/2021   Lab Results  Component Value Date   ALT 11 02/12/2021   AST 16 02/12/2021   ALKPHOS 99 02/12/2021   BILITOT 1.5 (H) 02/12/2021   Lab Results  Component Value Date   CHOL 131 04/30/2020   HDL 61.90 04/30/2020   LDLCALC 50 04/30/2020   TRIG 92.0 04/30/2020   CHOLHDL 2 04/30/2020    Lab Results  Component Value Date   HGBA1C 5.3 04/30/2020   Lab Results  Component Value Date   INR 2.9 03/05/2021   INR 2.0 (H) 02/12/2021   INR 2.8 01/29/2021     Assessment & Plan   1.  Coronary artery disease: Status post CABG x3 in 2013.  Nonischemic Myoview in March 2014.  Recent ER evaluation secondary to dyspnea with mild troponin elevation to a peak of 39.  He has not been having any chest pain.  Recent echo with EF 55% and grade 2 diastolic dysfunction.  Severe calcification with concern for aortic stenosis noted, prompting discussion today regarding right and  left heart diagnostic catheterization.  The patient understands that risks include but are not limited to stroke (1 in 1000), death (1 in 71), kidney failure [usually temporary] (1 in 500), bleeding (1 in 200), allergic reaction [possibly serious] (1 in 200), and agrees to proceed.  Patient has history of unprovoked DVTs and is on chronic  Coumadin.  We will follow-up labs today and plan to have him seen in our Coumadin clinic tomorrow for Lovenox teaching, as he will require a bridge.  He is tentatively scheduled for right left heart catheterization next Monday with Dr. Fletcher Anon.  Continue beta-blocker, statin, and calcium channel blocker.  He is not historically on aspirin in the setting of warfarin.  Importantly, he has a prior history of cerebral hemorrhage while on aspirin and Plavix, though has tolerated warfarin.  2.  Aortic stenosis: Recent echo suggested severe aortic stenosis by aortic valve area, but only mild aortic stenosis by gradient.  Given constellation of symptoms, history, and echo findings, will plan right left heart catheterization as outlined above.  Interestingly, he does not have an aortic valve murmur on examination.  3.  Essential hypertension: Blood pressure mildly elevated today but was stable at last visit.  We will continue his current regimen that includes amlodipine, carvedilol.  4.  Hyperlipidemia: LDL of 50 in January this year with normal LFTs at that time.  Continue statin therapy.  5.  Peripheral arterial disease: Known carotid, renal artery, common iliac, left subclavian, and left axillary artery disease.  Aortic/lower arterial duplex, ABIs, and carotid duplex were stable in June 14, 2000 and he has orders in place for follow-up next year.  He has chronic bilateral calf claudication, which is unchanged.  Continue statin.  No aspirin in the setting of chronic warfarin.  6.  History of bilateral lower extremity DVTs: On chronic warfarin with therapeutic INR of 2.9 in November 11.  As outlined above, in the setting of need for diagnostic catheterization, will need to bridge with Lovenox and he will see our Coumadin nurse tomorrow to discuss.  He is 80 kg and thus will require 80 mg subcu twice daily.  7.  Abdominal aortic aneurysm: Stable at 3.6 cm on abdominal ultrasound in February 2022.  Plan  follow-up next year.  8.  Acute kidney injury: Creatinine rose to 1.61 on low-dose Lasix and has since been held since November 4.  Follow-up labs today.  9.  Disposition: Follow-up CBC, basic metabolic panel, and INR today.  He will follow-up in Coumadin clinic tomorrow to discuss Lovenox bridging-timing based on INR today.  Plan for right left heart diagnostic catheterization on Monday with Dr. Fletcher Anon.  Follow-up in clinic approximate 2 weeks post catheterization.  Murray Hodgkins, NP 03/09/2021, 6:16 PM

## 2021-03-09 NOTE — Progress Notes (Signed)
To  Cardiology Clinic Note   Patient Name: [name] Date of Encounter: 03/09/2021  Primary Care Provider:  Owens Loffler, MD Primary Cardiologist:  [name], MD  Patient Profile    80 year old male with a history of peripheral arterial disease, renal artery stenosis, carotid arterial disease, tobacco abuse, COPD, abdominal aortic aneurysm, intracranial hemorrhage in the setting of malignant hypertension, hyperlipidemia, bilateral lower extremity DVTs, heart failure with improved ejection fraction/ischemic cardiomyopathy, and CAD status post CABG x4 (March 2013), who presents for follow-up related to dyspnea and finding of severe aortic stenosis on echocardiogram.  Past Medical History    Past Medical History:  Diagnosis Date   ([place]) heart failure with improved ejection fraction ([place])    a. 06/2011 Echo: EF 35-40%; b. 06/2012 Echo: EF 50%; c. 12/2016 Echo: EF 55-60%, no rwma, GRI DD, mild AS/MR, nl RV fxn, nl RVSP; d. 02/2021 Echo: EF 55%, GrII DD. Mild MR. Mild to mod TR. Sev AS by VTI.   AAA (abdominal aortic aneurysm)    a. 05/2020 Abd u/s: 3.6cm.   Aortic calcification ([facility]) 05/01/2013   Aortic stenosis    a. 02/2021 Echo: EF 55%, GrII DD. Sev Ca2+ of AoV. Sev AS w/ AVA by VTI 0.79cm^2.   Bilateral renal artery stenosis ([place])    a. 06/2013 Angio/PTA: LRA: 95 (5x18 Herculink stent), RRA 60ost, celiac/SMA nl, RCIA patent stents extending into dist Ao, RIIA 70ost, REIA min irregs, LCIA patent stent, LIIA 60-70ost, LEIA min irregs.   Coronary artery disease    a. 2013 s/p CABG x 3 (LIMA->LAD, VG->OM, VG->RPDA; b. 06/2012 MV: no ischemia.   History of prior cigarette smoking 04/11/2008   Qualifier: Diagnosis of  By: Diona Browner MD, Amy     Hypertension    Hypothyroidism    Intraventricular hemorrhage ([place]) 07/12/2012   Ischemic cardiomyopathy    a. 06/2011 Echo: EF 35-40%; b. 06/2012 Echo: EF 50%; c. 12/2016 Echo: EF 55-60%, no rwma, GRI DD.   Peripheral arterial disease ([place])     a. Previous left lower extremity stenting by Dr. Jamal Collin;  b. 12/2012 s/p bilat ostial common iliac stenting; c. 05/2020 ABIs: R 0.74, L 0.73-->Stable. Patent bilat common/ext iliacs by duplex.   Right leg DVT ([place]) 08/13/2012   Subclavian artery stenosis, left ([place]) 05/2012   Status post stenting of the ostium and self-expanding stent placement to the left axillary artery   Venous insufficiency    Past Surgical History:  Procedure Laterality Date   ABDOMINAL ANGIOGRAM  07/10/2013   WITH BI-FEMORAL RUNOFF       DR Fletcher Anon   ABDOMINAL AORTAGRAM N/A 12/26/2012   Procedure: ABDOMINAL Maxcine Ham;  Surgeon: Wellington Hampshire, MD;  Location: [facility] CATH LAB;  Service: Cardiovascular;  Laterality: N/A;   ABDOMINAL AORTAGRAM N/A 07/10/2013   Procedure: ABDOMINAL Maxcine Ham;  Surgeon: Wellington Hampshire, MD;  Location: [facility] CATH LAB;  Service: Cardiovascular;  Laterality: N/A;   ANGIOPLASTY / STENTING FEMORAL  05/2012   ANGIOPLASTY / STENTING ILIAC Bilateral 12/26/2012   ARCH AORTOGRAM N/A 06/20/2012   Procedure: ARCH AORTOGRAM;  Surgeon: Wellington Hampshire, MD;  Location: [facility] CATH LAB;  Service: Cardiovascular;  Laterality: N/A;   CARDIAC CATHETERIZATION  2/14   [place]   CHOLECYSTECTOMY  1990's   CORONARY ARTERY BYPASS GRAFT  07/11/2011   Procedure: CORONARY ARTERY BYPASS GRAFTING (CABG);  Surgeon: Grace Isaac, MD;  Location: [facility];  Service: Open Heart Surgery;  Laterality: N/A;  Times 3. On Pump. Using right greater  saphenous vein and left internal mammary artery.    HERNIA REPAIR     INSERTION OF ILIAC STENT Bilateral 12/26/2012   Procedure: INSERTION OF ILIAC STENT;  Surgeon: Wellington Hampshire, MD;  Location: [facility] CATH LAB;  Service: Cardiovascular;  Laterality: Bilateral;  Bilateral Common Iliac Artery   LEFT AND RIGHT HEART CATHETERIZATION WITH CORONARY ANGIOGRAM  07/07/2011   Procedure: LEFT AND RIGHT HEART CATHETERIZATION WITH CORONARY ANGIOGRAM;  Surgeon: Minna Merritts, MD;  Location: [facility] CATH LAB;  Service:  Cardiovascular;;   LOWER EXTREMITY VENOGRAPHY Bilateral 12/30/2016   Procedure: Lower Extremity Venography;  Surgeon: Katha Cabal, MD;  Location: [facility] CV LAB;  Service: Cardiovascular;  Laterality: Bilateral;   PERCUTANEOUS STENT INTERVENTION  06/20/2012   Procedure: PERCUTANEOUS STENT INTERVENTION;  Surgeon: Wellington Hampshire, MD;  Location: [facility] CATH LAB;  Service: Cardiovascular;;   RENAL ARTERY ANGIOPLASTY Left 07/10/2013   DR ARIDA   SUBCLAVIAN ARTERY STENT  05/2012   "2 stents" (12/26/2012)   TOOTH EXTRACTION  Spring 2017   mulitple (6)   VENA CAVA FILTER PLACEMENT  07/2012   Removable    Allergies  Allergies  Allergen Reactions   Plavix [Clopidogrel Bisulfate]     Brain hemorrhage prev while on plavix and aspirin    History of Present Illness    80 year old male with above complex past medical history including peripheral arterial disease, renal artery stenosis, carotid arterial disease, tobacco abuse, COPD, abdominal aortic aneurysm, intracranial hemorrhage in the setting of malignant hypertension, hyperlipidemia, bilateral lower extremity DVTs, heart failure with improved ejection fraction/ischemic cardiomyopathy, and CAD status post CABG x4.  He has previously undergone stenting of the left subclavian and left axillary arteries as well as bilateral common iliac and left renal arteries.  As noted above, in March 2013, he underwent CABG x3 with placement of a LIMA to the LAD, vein graft to the PDA, and vein graft to the obtuse marginal.  He suffered an intracranial hemorrhage in 2014 which was suspected to be due to malignant hypertension and dual antiplatelet therapy.  Shortly thereafter, he was diagnosed with extensive right-sided DVT.  Anticoagulation was contraindicated and thus an IVC filter was placed.  In 2018, he had extensive bilateral occlusive DVTs which required catheter-based therapy and he has been on anticoagulation since then.  Of note, his EF was previously as  low as 35 to 40% prior to his bypass surgery with subsequent improvement to 55 to 60% by echo in September 2018.  Mr. Cocozza was last seen in cardiology clinic on October 28 with a 1 month history of dyspnea on exertion on the background of a several month history of fatigue and chronic bilateral calf claudication, which was stable.  Due to worsening dyspnea, he was seen at urgent care on October 21.  Chest x-ray was unremarkable.  He was referred to the emergency department for further evaluation.  BNP was 632.7 while troponins were mildly elevated at 39  38.  CT angio of the chest was negative for PE.  He was noted to have a small amount of pleural fluid within the left major fissure and after he refused admission, was discharged home on Lasix, albuterol, and steroids.  He had some improvement on steroid and diuretic therapy and required de-escalation of furosemide dose to 20 mg every other day in the setting of a rising BUN and creatinine (33/1.4 to on October 24).  He had not been experiencing any chest pain.  Follow-up labs November 4 showed  further rise in creatinine to 1.61 with a BUN of 33.  He was advised to discontinue Lasix and use only as needed for weight gain of 2 pounds or more.  He has not required any additional Lasix.  I obtained a 2D echocardiogram, which showed an EF of 55% with grade 2 diastolic dysfunction, mild MR, mild to moderate TR, and severe aortic stenosis by VTI with a valve area of 0.79 cm.  Mild aortic root dilatation of 37 mm was also noted.  In the setting of suggestion of severe aortic stenosis with progressive dyspnea and known CAD with mild troponin elevation at ER visit, patient was contacted for follow-up to arrange right and left heart diagnostic catheterization.  Since his last visit, he has had persistent fatigue and dyspnea with minimal activity.  He again denies chest pain.  He has chronic bilateral calf claudication, which has been stable.  He denies palpitations, PND,  orthopnea, dizziness, syncope, edema, or early satiety.  Home Medications    Current Outpatient Medications  Medication Sig Dispense Refill   amLODipine (NORVASC) 5 MG tablet TAKE 1 TABLET BY MOUTH EVERY DAY 90 tablet 3   atorvastatin (LIPITOR) 20 MG tablet Take 1 tablet (20 mg total) by mouth daily. (Patient taking differently: Take 20 mg by mouth every evening.) 90 tablet 0   carvedilol (COREG) 6.25 MG tablet TAKE 1 TABLET BY MOUTH TWICE A DAY WITH MEALS 180 tablet 3   furosemide (LASIX) 40 MG tablet Take 0.5 tablets (20 mg total) by mouth as needed (As needed for weight gain of 2 pounds in 24 hours or 5 pounds in one week.). 23 tablet 2   levothyroxine (SYNTHROID) 150 MCG tablet TAKE ONE TABLET BY MOUTH EVERY DAY BEFORE BREAKFAST 90 tablet 3   triamcinolone cream (KENALOG) 0.1 % Apply 1 application topically 2 (two) times daily. (Patient taking differently: Apply 1 application topically 2 (two) times daily as needed (skin rash/irritation).) 30 g 1   warfarin (COUMADIN) 5 MG tablet TAKE ONE TABLET BY MOUTH EVERY DAY OR AS DIRECTED BY COUMADIN CLINIC (Patient taking differently: Take 2.5-5 mg by mouth See admin instructions. Take 0.5 tablet (2.5 mg) by mouth on Sundays, Tuesdays, Thursdays & Saturdays. Take 1 tablet (5 mg) by mouth on Mondays, Wednesdays & Fridays.) 90 tablet 1   No current facility-administered medications for this visit.     Family History    Family History  Problem Relation Age of Onset   Alcohol abuse Father    Cirrhosis Father    Hypothyroidism Sister    He indicated that his mother is deceased. He indicated that his father is deceased. He indicated that the status of his sister is unknown.  Social History    Social History   Socioeconomic History   Marital status: Married    Spouse name: Not on file   Number of children: 2   Years of education: Not on file   Highest education level: Not on file  Occupational History   Occupation: Retired    Fish farm manager:  RETIRED    Comment: [employer]  Tobacco Use   Smoking status: Former    Packs/day: 1.00    Years: 50.00    Pack years: 50.00    Types: Cigarettes    Quit date: 12/25/2010    Years since quitting: 10.2   Smokeless tobacco: Never  Vaping Use   Vaping Use: Never used  Substance and Sexual Activity   Alcohol use: No   Drug use: No  Sexual activity: Not Currently  Other Topics Concern   Not on file  Social History Narrative   Exercising 3 times a week.    Moderate diet control.    O living will, no HCPOA.   Social Determinants of Health   Financial Resource Strain: Not on file  Food Insecurity: Not on file  Transportation Needs: Not on file  Physical Activity: Not on file  Stress: Not on file  Social Connections: Not on file  Intimate Partner Violence: Not on file     Review of Systems    General: Fatigue.  No chills, fever, night sweats or weight changes.  Cardiovascular:  No chest pain, +++ dyspnea on exertion, no edema, orthopnea, palpitations, paroxysmal nocturnal dyspnea. Dermatological: No rash, lesions/masses Respiratory: No cough, +++ dyspnea Urologic: No hematuria, dysuria Abdominal:   No nausea, vomiting, diarrhea, bright red blood per rectum, melena, or hematemesis Neurologic:  No visual changes, wkns, changes in mental status. All other systems reviewed and are otherwise negative except as noted above.    Physical Exam    VS:  BP (!) 142/72 (BP Location: Left Arm, Patient Position: Sitting, Cuff Size: Normal)   Pulse 72   Ht 5\' 9"  (1.753 m)   Wt 178 lb 4 oz (80.9 kg)   SpO2 98%   BMI 26.32 kg/m  , BMI Body mass index is 26.32 kg/m.     GEN: Well nourished, well developed, in no acute distress. HEENT: normal. Neck: Supple, no JVD, carotid bruits, or masses. Cardiac: RRR, no murmurs, rubs, or gallops. No clubbing, cyanosis, edema.  Radials 2+/PT 1+ and equal bilaterally.  Respiratory:  Respirations regular and unlabored, clear to auscultation  bilaterally. GI: Soft, nontender, nondistended, BS + x 4. MS: no deformity or atrophy. Skin: warm and dry, no rash. Neuro:  Strength and sensation are intact. Psych: Normal affect.  Accessory Clinical Findings    ECG personally reviewed by me today-regular sinus rhythm, rate inaccurate in the setting of baseline artifact.  PVCs appear to be present- No acute changes  Lab Results  Component Value Date   WBC 7.6 02/12/2021   HGB 16.5 02/12/2021   HCT 51.0 02/12/2021   MCV 90.6 02/12/2021   PLT 210 02/12/2021   Lab Results  Component Value Date   CREATININE 1.61 (H) 02/26/2021   BUN 33 (H) 02/26/2021   NA 143 02/26/2021   K 4.4 02/26/2021   CL 101 02/26/2021   CO2 26 02/26/2021   Lab Results  Component Value Date   ALT 11 02/12/2021   AST 16 02/12/2021   ALKPHOS 99 02/12/2021   BILITOT 1.5 (H) 02/12/2021   Lab Results  Component Value Date   CHOL 131 04/30/2020   HDL 61.90 04/30/2020   LDLCALC 50 04/30/2020   TRIG 92.0 04/30/2020   CHOLHDL 2 04/30/2020    Lab Results  Component Value Date   HGBA1C 5.3 04/30/2020   Lab Results  Component Value Date   INR 2.9 03/05/2021   INR 2.0 (H) 02/12/2021   INR 2.8 01/29/2021     Assessment & Plan   1.  Coronary artery disease: Status post CABG x3 in 2013.  Nonischemic Myoview in March 2014.  Recent ER evaluation secondary to dyspnea with mild troponin elevation to a peak of 39.  He has not been having any chest pain.  Recent echo with EF 55% and grade 2 diastolic dysfunction.  Severe calcification with concern for aortic stenosis noted, prompting discussion today regarding right and  left heart diagnostic catheterization.  The patient understands that risks include but are not limited to stroke (1 in 1000), death (1 in 32), kidney failure [usually temporary] (1 in 500), bleeding (1 in 200), allergic reaction [possibly serious] (1 in 200), and agrees to proceed.  Patient has history of unprovoked DVTs and is on chronic  Coumadin.  We will follow-up labs today and plan to have him seen in our Coumadin clinic tomorrow for Lovenox teaching, as he will require a bridge.  He is tentatively scheduled for right left heart catheterization next Monday with Dr. Fletcher Anon.  Continue beta-blocker, statin, and calcium channel blocker.  He is not historically on aspirin in the setting of warfarin.  Importantly, he has a prior history of cerebral hemorrhage while on aspirin and Plavix, though has tolerated warfarin.  2.  Aortic stenosis: Recent echo suggested severe aortic stenosis by aortic valve area, but only mild aortic stenosis by gradient.  Given constellation of symptoms, history, and echo findings, will plan right left heart catheterization as outlined above.  Interestingly, he does not have an aortic valve murmur on examination.  3.  Essential hypertension: Blood pressure mildly elevated today but was stable at last visit.  We will continue his current regimen that includes amlodipine, carvedilol.  4.  Hyperlipidemia: LDL of 50 in January this year with normal LFTs at that time.  Continue statin therapy.  5.  Peripheral arterial disease: Known carotid, renal artery, common iliac, left subclavian, and left axillary artery disease.  Aortic/lower arterial duplex, ABIs, and carotid duplex were stable in June 14, 2000 and he has orders in place for follow-up next year.  He has chronic bilateral calf claudication, which is unchanged.  Continue statin.  No aspirin in the setting of chronic warfarin.  6.  History of bilateral lower extremity DVTs: On chronic warfarin with therapeutic INR of 2.9 in November 11.  As outlined above, in the setting of need for diagnostic catheterization, will need to bridge with Lovenox and he will see our Coumadin nurse tomorrow to discuss.  He is 80 kg and thus will require 80 mg subcu twice daily.  7.  Abdominal aortic aneurysm: Stable at 3.6 cm on abdominal ultrasound in February 2022.  Plan  follow-up next year.  8.  Acute kidney injury: Creatinine rose to 1.61 on low-dose Lasix and has since been held since November 4.  Follow-up labs today.  9.  Disposition: Follow-up CBC, basic metabolic panel, and INR today.  He will follow-up in Coumadin clinic tomorrow to discuss Lovenox bridging-timing based on INR today.  Plan for right left heart diagnostic catheterization on Monday with Dr. Fletcher Anon.  Follow-up in clinic approximate 2 weeks post catheterization.  Murray Hodgkins, NP 03/09/2021, 6:16 PM

## 2021-03-09 NOTE — Patient Instructions (Signed)
Medication Instructions:  - Your physician recommends that you continue on your current medications as directed. Please refer to the Current Medication list given to you today.  *If you need a refill on your cardiac medications before your next appointment, please call your pharmacy*   Lab Work: - Your physician recommends that you have lab work today: BMP/ CBC/ INR  If you have labs (blood work) drawn today and your tests are completely normal, you will receive your results only by: MyChart Message (if you have MyChart) OR A paper copy in the mail If you have any lab test that is abnormal or we need to change your treatment, we will call you to review the results.   Testing/Procedures:  1) Right & Left Heart Catheterization  You are scheduled for a Cardiac Catheterization on Monday, November 21 with Dr. [name].  1. Please arrive at the [facility] at 10:00 AM (This time is one hour before your procedure to ensure your preparation). Free valet parking service is available. Once you enter the [facility], proceed to the 1st desk on the right, Registration, to check in.   Special note: Every effort is made to have your procedure done on time. Please understand that emergencies sometimes delay scheduled procedures.  2. Diet: Do not eat solid foods after midnight.  You may have clear liquids until 5am upon the day of the procedure.  3. Labs: today- 03/09/21  4. Medication instructions in preparation for your procedure:   Contrast Allergy: No    YOU will be instructed by our Coumadin Clinic nurse tomorrow (03/10/21) on when to stop coumadin and start lovenox bridging  HOLD lasix (furosemide) the morning of your procedure    On the morning of your procedure, take your Aspirin 81 mg and any morning medicines NOT listed above.  You may use sips of water.  5. Plan for one night stay--bring personal belongings. 6. Bring a current list of your medications  and current insurance cards. 7. You MUST have a responsible person to drive you home. 8. Someone MUST be with you the first 24 hours after you arrive home or your discharge will be delayed. 9. Please wear clothes that are easy to get on and off and wear slip-on shoes.  Thank you for allowing [place] to care for you!   -- [facility]    Follow-Up: At [facility], you and your health needs are our priority.  As part of our continuing mission to provide you with exceptional heart care, we have created designated Provider Care Teams.  These Care Teams include your primary Cardiologist (physician) and Advanced Practice Providers (APPs -  Physician Assistants and Nurse Practitioners) who all work together to provide you with the care you need, when you need it.  We recommend signing up for the patient portal called "MyChart".  Sign up information is provided on this After Visit Summary.  MyChart is used to connect with patients for Virtual Visits (Telemedicine).  Patients are able to view lab/test results, encounter notes, upcoming appointments, etc.  Non-urgent messages can be sent to your provider as well.   To learn more about what you can do with MyChart, go to [email or web address].    Your next appointment:   1) New Coumadin appointment tomorrow- 03/11/21- for lovenox bridging  2) 2-3 weeks with Dr. Fletcher Anon Ignacia Bayley, NP  The format for your next appointment:   In Person  Provider:   As above  Other Instructions  Coronary Angiogram A coronary angiogram is an X-ray procedure that is used to examine the arteries in the heart. Contrast dye is injected through a long, thin tube (catheter) into these arteries. Then X-rays are taken to show any blockage in these arteries. You may have this procedure if you: Are having chest pain, or other symptoms of angina, and you are at risk for heart disease. Have an abnormal stress test or test of your heart's  electrical activity (electrocardiogram, or ECG). Have chest pain and heart failure. Are having irregular heart rhythms. A coronary angiogram or heart catheterization can show if you have valve disease or a disease of the aorta. This procedure can also be used to check the overall function of your heart muscle. Let your health care provider know about: Any allergies you have, including allergies to medicines or contrast dye. All medicines you are taking, including vitamins, herbs, eye drops, creams, and over-the-counter medicines. Any problems you or family members have had with anesthetic medicines. Any blood disorders you have. Any surgeries you have had. Any history of kidney problems or kidney failure. Any medical conditions you have. Whether you are pregnant or may be pregnant. Whether you are breastfeeding. What are the risks? Generally, this is a safe procedure. However, problems may occur, including: Infection. Allergic reaction to medicines or dyes that are used. Bleeding from the insertion site or other places. Damage to nearby structures, such as blood vessels, or damage to kidneys from contrast dye. Irregular heart rhythms. Stroke (rare). Heart attack (rare). What happens before the procedure? Staying hydrated Follow instructions from your health care provider about hydration, which may include: Up to 2 hours before the procedure - you may continue to drink clear liquids, such as water, clear fruit juice, black coffee, and plain tea.  Eating and drinking restrictions Follow instructions from your health care provider about eating and drinking, which may include: 8 hours before the procedure - stop eating heavy meals or foods, such as meat, fried foods, or fatty foods. 6 hours before the procedure - stop eating light meals or foods, such as toast or cereal. 6 hours before the procedure - stop drinking milk or drinks that contain milk. 2 hours before the procedure - stop  drinking clear liquids. Medicines Ask your health care provider about: Changing or stopping your regular medicines. This is especially important if you are taking diabetes medicines or blood thinners. Taking medicines such as aspirin and ibuprofen. These medicines can thin your blood. Do not take these medicines unless your health care provider tells you to take them. Aspirin may be recommended before coronary angiograms even if you do not normally take it. Taking over-the-counter medicines, vitamins, herbs, and supplements. General instructions Do not use any products that contain nicotine or tobacco for at least 4 weeks before the procedure. These products include cigarettes, e-cigarettes, and chewing tobacco. If you need help quitting, ask your health care provider. You may have an exam or testing. Plan to have someone take you home from the hospital or clinic. If you will be going home right after the procedure, plan to have someone with you for 24 hours. Ask your health care provider: How your insertion site will be marked. What steps will be taken to help prevent infection. These may include: Removing hair at the insertion site. Washing skin with a germ-killing soap. Taking antibiotic medicine. What happens during the procedure?  You will lie on your back on an X-ray table. An IV  will be inserted into one of your veins. Electrodes will be placed on your chest. You will be given one or more of the following: A medicine to help you relax (sedative). A medicine to numb the catheter insertion area (local anesthetic). You will be connected to a continuous ECG monitor. The catheter will be inserted into an artery in one of these areas: Your groin area in your upper thigh. Your wrist. The fold of your arm, near your elbow. An X-ray procedure (fluoroscopy) will be used to help guide the catheter to the opening of the blood vessel to be used. A dye will be injected into the catheter and  X-rays will be taken. The dye will help to show any narrowing or blockages in the heart arteries. Tell your health care provider if you have chest pain or trouble breathing. If blockages are found, another procedure may be done to open the artery. The catheter will be removed after the fluoroscopy is complete. A bandage (dressing) will be placed over the insertion site. Pressure will be applied to stop bleeding. The IV will be removed. The procedure may vary among health care providers and hospitals. What happens after the procedure? Your blood pressure, heart rate, breathing rate, and blood oxygen level will be monitored until you leave the hospital or clinic. You will need to lie still for a few hours, or for as long as told by your health care provider. If the procedure is done through the groin, you will be told not to bend or cross your legs. The insertion site and the pulse in your foot or wrist will be checked often. More blood tests, X-rays, and an ECG may be done. Do not drive for 24 hours if you were given a sedative during your procedure. Summary A coronary angiogram is an X-ray procedure that is used to examine the arteries in the heart. Contrast dye is injected through a long, thin tube (catheter) into each artery. Tell your health care provider about any allergies you have, including allergies to contrast dye. After the procedure, you will need to lie still for a few hours and drink plenty of fluids. This information is not intended to replace advice given to you by your health care provider. Make sure you discuss any questions you have with your health care provider. Document Revised: 11/01/2018 Document Reviewed: 11/01/2018 Elsevier Patient Education  [place].

## 2021-03-10 ENCOUNTER — Ambulatory Visit: Payer: Medicare Other

## 2021-03-10 ENCOUNTER — Telehealth: Payer: Self-pay

## 2021-03-10 ENCOUNTER — Ambulatory Visit (INDEPENDENT_AMBULATORY_CARE_PROVIDER_SITE_OTHER): Payer: Medicare Other

## 2021-03-10 DIAGNOSIS — Z7901 Long term (current) use of anticoagulants: Secondary | ICD-10-CM

## 2021-03-10 LAB — SPECIMEN STATUS

## 2021-03-10 LAB — POCT INR: INR: 2.2 (ref 2.0–3.0)

## 2021-03-10 MED ORDER — ENOXAPARIN SODIUM 80 MG/0.8ML IJ SOSY: 80.0000 mg | PREFILLED_SYRINGE | Freq: Two times a day (BID) | INTRAMUSCULAR | 0 refills | Status: DC

## 2021-03-10 NOTE — Telephone Encounter (Signed)
Noted. Sent in lovenox prescription

## 2021-03-10 NOTE — Patient Instructions (Incomplete)
Pre visit review using our clinic review tool, if applicable. No additional management support is needed unless otherwise documented below in the visit note.  11/16: Take last dose of coumadin 11/17: No coumadin, no lovenox 11/18: No coumadin, lovenox every 12 hours 11/19: No coumadin, lovenox every 12 hours 11/20: No coumadin, lovenox in AM ONLY   11/21: Procedure; NO COUMADIN, NO LOVENOX   11/22: Take 1 tablet (5mg ) coumadin; lovenox every 12 hours 11/23: Take 1 1/2 tablets (7.5mg ) coumadin; lovenox every 12 hours 11/24: Take 1 tablet (5mg ) coumadin; lovenox every 12 hours 11/25: Take 1 1/2 tablets (7.5mg ) coumadin; lovenox every 12 hours 11/26: Take 1/2 tablet (2.5mg ) coumadin; lovenox every 12 hours 11/27: Take 1/2 tablet (2.5mg ) coumadin; lovenox every 12 hours 11/28: Recheck INR at [facility], [place]; NO COUMADIN, NO LOVENOX UNTIL RECHECKED

## 2021-03-10 NOTE — Telephone Encounter (Signed)
Yes. Thank-you so much, Shannon.

## 2021-03-10 NOTE — Telephone Encounter (Signed)
Received secure msg from pharmacist at cardiology office reporting pt has apt with them today for [place] for lovenox bridge schedule and education. He wanted to know if we managed the pt's bridges. Advised this nurse does. Advised this nurse will see the pt today and educate pt on lovenox injections and scheduling of coumadin and lovenox. Nurse and pharmacist verbalized understanding.  Contacted pt and he is going to come to [facility] today for education and dosing instructions. Gave pt address and time for apt of 2:30. Pt agreed and will try to pick up the lovenox before coming to the apt if the pharmacy has it ready. [facility] [address], [place].  Dosing for heart cath on 11/21; last INR 11/11 was 2.9 Pt wt: 80.9kg CrCl: 58.08 mL/min Lovenox dosing; 80 mg, Q12H Normal coumadin dosing is 2.5 mg daily except take 5 mg on Mon, Wed, and Fri.  11/16: Take last dose of coumadin 11/17: No coumadin, no lovenox 11/18: No coumadin, lovenox every 12 hours 11/19: No coumadin, lovenox every 12 hours 11/20: No coumadin, lovenox in AM ONLY  11/21: Procedure; NO COUMADIN, NO LOVENOX  11/22: Take 1 tablet (5mg ) coumadin; lovenox every 12 hours 11/23: Take 1 1/2 tablets (7.5mg ) coumadin; lovenox every 12 hours 11/24: Take 1 tablet (5mg ) coumadin; lovenox every 12 hours 11/25: Take 1 1/2 tablets (7.5mg ) coumadin; lovenox every 12 hours 11/26: Take 1/2 tablet (2.5mg ) coumadin; lovenox every 12 hours 11/27: Take 1/2 tablet (2.5mg ) coumadin; lovenox every 12 hours 11/28: Recheck INR at [facility], [place]; NO COUMADIN, NO LOVENOX UNTIL RECHECKED

## 2021-03-10 NOTE — Progress Notes (Deleted)
  11/16: Take last dose of coumadin 11/17: No coumadin, no lovenox 11/18: No coumadin, lovenox every 12 hours 11/19: No coumadin, lovenox every 12 hours 11/20: No coumadin, lovenox in AM ONLY   11/21: Procedure; NO COUMADIN, NO LOVENOX   11/22: Take 1 tablet (5mg ) coumadin; lovenox every 12 hours 11/23: Take 1 1/2 tablets (7.5mg ) coumadin; lovenox every 12 hours 11/24: Take 1 tablet (5mg ) coumadin; lovenox every 12 hours 11/25: Take 1 1/2 tablets (7.5mg ) coumadin; lovenox every 12 hours 11/26: Take 1/2 tablet (2.5mg ) coumadin; lovenox every 12 hours 11/27: Take 1/2 tablet (2.5mg ) coumadin; lovenox every 12 hours 11/28: Recheck INR at [facility], [place]; NO COUMADIN, NO LOVENOX UNTIL RECHECKED

## 2021-03-10 NOTE — Progress Notes (Signed)
  11/16: Take last dose of coumadin 11/17: No coumadin, no lovenox 11/18: No coumadin, lovenox every 12 hours 11/19: No coumadin, lovenox every 12 hours 11/20: No coumadin, lovenox in AM ONLY   11/21: Procedure; NO COUMADIN, NO LOVENOX   11/22: Take 1 tablet (5mg ) coumadin; lovenox every 12 hours 11/23: Take 1 1/2 tablets (7.5mg ) coumadin; lovenox every 12 hours 11/24: Take 1 tablet (5mg ) coumadin; lovenox every 12 hours 11/25: Take 1 1/2 tablets (7.5mg ) coumadin; lovenox every 12 hours 11/26: Take 1/2 tablet (2.5mg ) coumadin; lovenox every 12 hours 11/27: Take 1/2 tablet (2.5mg ) coumadin; lovenox every 12 hours 11/28: Recheck INR at [facility], [place]; NO COUMADIN, NO LOVENOX UNTIL RECHECKED

## 2021-03-11 NOTE — Addendum Note (Signed)
Addended by: Britt Bottom on: 03/11/2021 03:19 PM   Modules accepted: Orders

## 2021-03-11 NOTE — Telephone Encounter (Signed)
Pt was in yesterday for INR check and lovenox teaching.

## 2021-03-12 ENCOUNTER — Other Ambulatory Visit: Payer: Self-pay | Admitting: Family Medicine

## 2021-03-12 ENCOUNTER — Other Ambulatory Visit: Payer: Self-pay | Admitting: Cardiovascular Disease

## 2021-03-12 DIAGNOSIS — L309 Dermatitis, unspecified: Secondary | ICD-10-CM

## 2021-03-12 DIAGNOSIS — Z7901 Long term (current) use of anticoagulants: Secondary | ICD-10-CM

## 2021-03-12 NOTE — Telephone Encounter (Signed)
Last office visit 02/15/2021 for COPD exacerbation.  Last refilled 02/10/2021 for 30 g with 1 refill.  No future appointment with PCP.

## 2021-03-12 NOTE — Telephone Encounter (Signed)
Pt is compliant with coumadin management. Sent in refill

## 2021-03-13 LAB — CBC WITH DIFFERENTIAL/PLATELET
Basophils Absolute: 0.1 10*3/uL (ref 0.0–0.2)
Basos: 1 %
EOS (ABSOLUTE): 0.3 10*3/uL (ref 0.0–0.4)
Eos: 6 %
Hematocrit: 47.3 % (ref 37.5–51.0)
Hemoglobin: 15.7 g/dL (ref 13.0–17.7)
Immature Grans (Abs): 0 10*3/uL (ref 0.0–0.1)
Immature Granulocytes: 0 %
Lymphocytes Absolute: 1.1 10*3/uL (ref 0.7–3.1)
Lymphs: 20 %
MCH: 29 pg (ref 26.6–33.0)
MCHC: 33.2 g/dL (ref 31.5–35.7)
MCV: 87 fL (ref 79–97)
Monocytes Absolute: 0.5 10*3/uL (ref 0.1–0.9)
Monocytes: 8 %
Neutrophils Absolute: 3.5 10*3/uL (ref 1.4–7.0)
Neutrophils: 65 %
Platelets: 230 10*3/uL (ref 150–450)
RBC: 5.41 x10E6/uL (ref 4.14–5.80)
RDW: 13.2 % (ref 11.6–15.4)
WBC: 5.4 10*3/uL (ref 3.4–10.8)

## 2021-03-13 LAB — BASIC METABOLIC PANEL
BUN/Creatinine Ratio: 13 (ref 10–24)
BUN: 15 mg/dL (ref 8–27)
CO2: 22 mmol/L (ref 20–29)
Calcium: 9.2 mg/dL (ref 8.6–10.2)
Chloride: 103 mmol/L (ref 96–106)
Creatinine, Ser: 1.18 mg/dL (ref 0.76–1.27)
Glucose: 82 mg/dL (ref 70–99)
Potassium: 5.2 mmol/L (ref 3.5–5.2)
Sodium: 139 mmol/L (ref 134–144)
eGFR: 63 mL/min/{1.73_m2} (ref >59)

## 2021-03-13 LAB — PROTIME-INR
INR: 2.2 — ABNORMAL HIGH (ref 0.9–1.2)
Prothrombin Time: 22 s — ABNORMAL HIGH (ref 9.1–12.0)

## 2021-03-13 LAB — SPECIMEN STATUS REPORT

## 2021-03-15 ENCOUNTER — Encounter
Admission: RE | Disposition: A | Discharge status: Home / Self Care | Payer: Self-pay | Source: Ambulatory Visit | Attending: Cardiovascular Disease

## 2021-03-15 ENCOUNTER — Other Ambulatory Visit: Payer: Self-pay

## 2021-03-15 ENCOUNTER — Encounter: Payer: Self-pay | Admitting: Cardiovascular Disease

## 2021-03-15 ENCOUNTER — Ambulatory Visit
Admission: RE | Admit: 2021-03-15 | Discharge: 2021-03-15 | Disposition: A | Discharge status: Home / Self Care | Payer: Medicare Other | Source: Ambulatory Visit | Attending: Cardiovascular Disease | Admitting: Cardiovascular Disease

## 2021-03-15 DIAGNOSIS — I110 Hypertensive heart disease with heart failure: Secondary | ICD-10-CM | POA: Diagnosis not present

## 2021-03-15 DIAGNOSIS — Z951 Presence of aortocoronary bypass graft: Secondary | ICD-10-CM | POA: Diagnosis not present

## 2021-03-15 DIAGNOSIS — I2510 Atherosclerotic heart disease of native coronary artery without angina pectoris: Secondary | ICD-10-CM | POA: Diagnosis not present

## 2021-03-15 DIAGNOSIS — E785 Hyperlipidemia, unspecified: Secondary | ICD-10-CM | POA: Insufficient documentation

## 2021-03-15 DIAGNOSIS — I739 Peripheral vascular disease, unspecified: Secondary | ICD-10-CM | POA: Diagnosis not present

## 2021-03-15 DIAGNOSIS — Z7901 Long term (current) use of anticoagulants: Secondary | ICD-10-CM | POA: Insufficient documentation

## 2021-03-15 DIAGNOSIS — N179 Acute kidney failure, unspecified: Secondary | ICD-10-CM | POA: Diagnosis not present

## 2021-03-15 DIAGNOSIS — Z7982 Long term (current) use of aspirin: Secondary | ICD-10-CM | POA: Insufficient documentation

## 2021-03-15 DIAGNOSIS — I5032 Chronic diastolic (congestive) heart failure: Secondary | ICD-10-CM | POA: Insufficient documentation

## 2021-03-15 DIAGNOSIS — I714 Abdominal aortic aneurysm, without rupture, unspecified: Secondary | ICD-10-CM | POA: Insufficient documentation

## 2021-03-15 DIAGNOSIS — Z79899 Other long term (current) drug therapy: Secondary | ICD-10-CM | POA: Insufficient documentation

## 2021-03-15 DIAGNOSIS — I350 Nonrheumatic aortic (valve) stenosis: Secondary | ICD-10-CM | POA: Diagnosis not present

## 2021-03-15 HISTORY — PX: RIGHT/LEFT HEART CATH AND CORONARY/GRAFT ANGIOGRAPHY: CATH118267

## 2021-03-15 LAB — PROTIME-INR
INR: 1.2 (ref 0.8–1.2)
Prothrombin Time: 15.6 seconds — ABNORMAL HIGH (ref 11.4–15.2)

## 2021-03-15 SURGERY — RIGHT/LEFT HEART CATH AND CORONARY/GRAFT ANGIOGRAPHY
Anesthesia: Moderate Sedation

## 2021-03-15 MED ORDER — MIDAZOLAM HCL 2 MG/2ML IJ SOLN
INTRAMUSCULAR | Status: DC | PRN
  Administered 2021-03-15: 1 mg via INTRAVENOUS

## 2021-03-15 MED ORDER — VERAPAMIL HCL 2.5 MG/ML IV SOLN
INTRAVENOUS | Status: DC | PRN
  Administered 2021-03-15: 2.5 mg via INTRA_ARTERIAL

## 2021-03-15 MED ORDER — IOHEXOL 350 MG/ML SOLN
INTRAVENOUS | Status: DC | PRN
  Administered 2021-03-15: 54 mL

## 2021-03-15 MED ORDER — HEPARIN SODIUM (PORCINE) 1000 UNIT/ML IJ SOLN
INTRAMUSCULAR | Status: DC | PRN
  Administered 2021-03-15: 4000 [IU] via INTRAVENOUS

## 2021-03-15 MED ORDER — LIDOCAINE HCL 1 % IJ SOLN
INTRAMUSCULAR | Status: AC
  Filled 2021-03-15: qty 20

## 2021-03-15 MED ORDER — LIDOCAINE HCL (PF) 1 % IJ SOLN
INTRAMUSCULAR | Status: DC | PRN
  Administered 2021-03-15: 2 mL

## 2021-03-15 MED ORDER — ACETAMINOPHEN 325 MG PO TABS: 650.0000 mg | ORAL_TABLET | ORAL | Status: DC | PRN

## 2021-03-15 MED ORDER — HEPARIN (PORCINE) IN NACL 1000-0.9 UT/500ML-% IV SOLN
INTRAVENOUS | Status: AC
  Filled 2021-03-15: qty 1000

## 2021-03-15 MED ORDER — SODIUM CHLORIDE 0.9 % IV SOLN: INTRAVENOUS | Status: DC

## 2021-03-15 MED ORDER — SODIUM CHLORIDE 0.9% FLUSH: 3.0000 mL | Freq: Two times a day (BID) | INTRAVENOUS | Status: DC

## 2021-03-15 MED ORDER — SODIUM CHLORIDE 0.9% FLUSH: 3.0000 mL | INTRAVENOUS | Status: DC | PRN

## 2021-03-15 MED ORDER — ASPIRIN 81 MG PO CHEW: 81.0000 mg | CHEWABLE_TABLET | ORAL | Status: DC

## 2021-03-15 MED ORDER — MIDAZOLAM HCL 2 MG/2ML IJ SOLN
INTRAMUSCULAR | Status: AC
  Filled 2021-03-15: qty 2

## 2021-03-15 MED ORDER — HEPARIN SODIUM (PORCINE) 1000 UNIT/ML IJ SOLN
INTRAMUSCULAR | Status: AC
  Filled 2021-03-15: qty 1

## 2021-03-15 MED ORDER — FENTANYL CITRATE (PF) 100 MCG/2ML IJ SOLN
INTRAMUSCULAR | Status: DC | PRN
  Administered 2021-03-15: 25 ug via INTRAVENOUS

## 2021-03-15 MED ORDER — HEPARIN (PORCINE) IN NACL 1000-0.9 UT/500ML-% IV SOLN
INTRAVENOUS | Status: DC | PRN
  Administered 2021-03-15: 1000 mL

## 2021-03-15 MED ORDER — FUROSEMIDE 20 MG PO TABS: 20.0000 mg | ORAL_TABLET | ORAL | 6 refills | Status: DC

## 2021-03-15 MED ORDER — FENTANYL CITRATE (PF) 100 MCG/2ML IJ SOLN
INTRAMUSCULAR | Status: AC
  Filled 2021-03-15: qty 2

## 2021-03-15 MED ORDER — SODIUM CHLORIDE 0.9 % IV SOLN: 250.0000 mL | INTRAVENOUS | Status: DC | PRN

## 2021-03-15 MED ORDER — VERAPAMIL HCL 2.5 MG/ML IV SOLN
INTRAVENOUS | Status: AC
  Filled 2021-03-15: qty 2

## 2021-03-15 MED ORDER — ONDANSETRON HCL 4 MG/2ML IJ SOLN: 4.0000 mg | Freq: Four times a day (QID) | INTRAMUSCULAR | Status: DC | PRN

## 2021-03-15 SURGICAL SUPPLY — 12 items
CATH 5F 110X4 TIG (CATHETERS) ×2 IMPLANT
CATH BALLN WEDGE 5F 110CM (CATHETERS) ×2 IMPLANT
DEVICE RAD TR BAND REGULAR (VASCULAR PRODUCTS) ×2 IMPLANT
DRAPE BRACHIAL (DRAPES) ×4 IMPLANT
GLIDESHEATH SLEND SS 6F .021 (SHEATH) ×4 IMPLANT
GUIDEWIRE INQWIRE 1.5J.035X260 (WIRE) ×1 IMPLANT
INQWIRE 1.5J .035X260CM (WIRE) ×2
PACK CARDIAC CATH (CUSTOM PROCEDURE TRAY) ×2 IMPLANT
PROTECTION STATION PRESSURIZED (MISCELLANEOUS) ×2
SET ATX SIMPLICITY (MISCELLANEOUS) ×2 IMPLANT
STATION PROTECTION PRESSURIZED (MISCELLANEOUS) ×1 IMPLANT
WIRE HITORQ VERSACORE ST 145CM (WIRE) ×2 IMPLANT

## 2021-03-15 NOTE — Interval H&P Note (Signed)
History and Physical Interval Note:  03/15/2021 10:48 AM  [name]  has presented today for surgery, with the diagnosis of RT LT Heart Cath with cors and graft    Aortic valve stenosis   CAD without angina   Hx of CABG.  The various methods of treatment have been discussed with the patient and family. After consideration of risks, benefits and other options for treatment, the patient has consented to  Procedure(s): RIGHT/LEFT HEART CATH AND CORONARY/GRAFT ANGIOGRAPHY (N/A) as a surgical intervention.  The patient's history has been reviewed, patient examined, no change in status, stable for surgery.  I have reviewed the patient's chart and labs.  Questions were answered to the patient's satisfaction.     [name]

## 2021-03-22 ENCOUNTER — Ambulatory Visit (INDEPENDENT_AMBULATORY_CARE_PROVIDER_SITE_OTHER): Payer: Medicare Other

## 2021-03-22 DIAGNOSIS — Z7901 Long term (current) use of anticoagulants: Secondary | ICD-10-CM

## 2021-03-22 LAB — POCT INR: INR: 2.5 (ref 2.0–3.0)

## 2021-03-22 NOTE — Patient Instructions (Addendum)
Pre visit review using our clinic review tool, if applicable. No additional management support is needed unless otherwise documented below in the visit note.  Stop lovenox and continue 1/2 tablet (2.5 mg) daily except take 1 tablet (5 mg) on Mon, Wed and Fri. Recheck in 6 weeks.

## 2021-03-22 NOTE — Progress Notes (Signed)
Stop lovenox and continue 1/2 tablet (2.5 mg) daily except take 1 tablet (5 mg) on Mon, Wed and Fri. Recheck in 6 weeks.

## 2021-03-23 ENCOUNTER — Other Ambulatory Visit: Payer: Self-pay

## 2021-03-23 ENCOUNTER — Encounter: Payer: Self-pay | Admitting: Cardiovascular Disease

## 2021-03-23 ENCOUNTER — Ambulatory Visit (INDEPENDENT_AMBULATORY_CARE_PROVIDER_SITE_OTHER): Payer: Medicare Other | Admitting: Cardiovascular Disease

## 2021-03-23 VITALS — BP 148/70 | HR 68 | Ht 69.0 in | Wt 176.4 lb

## 2021-03-23 DIAGNOSIS — E785 Hyperlipidemia, unspecified: Secondary | ICD-10-CM

## 2021-03-23 DIAGNOSIS — Z86718 Personal history of other venous thrombosis and embolism: Secondary | ICD-10-CM

## 2021-03-23 DIAGNOSIS — I714 Abdominal aortic aneurysm, without rupture, unspecified: Secondary | ICD-10-CM

## 2021-03-23 DIAGNOSIS — I10 Essential (primary) hypertension: Secondary | ICD-10-CM

## 2021-03-23 DIAGNOSIS — I739 Peripheral vascular disease, unspecified: Secondary | ICD-10-CM

## 2021-03-23 DIAGNOSIS — I255 Ischemic cardiomyopathy: Secondary | ICD-10-CM

## 2021-03-23 DIAGNOSIS — I6523 Occlusion and stenosis of bilateral carotid arteries: Secondary | ICD-10-CM | POA: Diagnosis not present

## 2021-03-23 DIAGNOSIS — I2510 Atherosclerotic heart disease of native coronary artery without angina pectoris: Secondary | ICD-10-CM | POA: Diagnosis not present

## 2021-03-23 DIAGNOSIS — I701 Atherosclerosis of renal artery: Secondary | ICD-10-CM

## 2021-03-23 NOTE — Patient Instructions (Signed)
Medication Instructions:  Your physician recommends that you continue on your current medications as directed. Please refer to the Current Medication list given to you today.   *If you need a refill on your cardiac medications before your next appointment, please call your pharmacy*   Lab Work: None ordered  If you have labs (blood work) drawn today and your tests are completely normal, you will receive your results only by: [place] (if you have MyChart) OR A paper copy in the mail If you have any lab test that is abnormal or we need to change your treatment, we will call you to review the results.   Testing/Procedures:  Your physician has requested that you have a aorta/ivc/iliac duplex  Your physician has requested that you have an ankle brachial index (ABI). During this test an ultrasound and blood pressure cuff are used to evaluate the arteries that supply the arms and legs with blood. Allow thirty minutes for this exam. There are no restrictions or special instructions.    Follow-Up: At [facility], you and your health needs are our priority.  As part of our continuing mission to provide you with exceptional heart care, we have created designated Provider Care Teams.  These Care Teams include your primary Cardiologist (physician) and Advanced Practice Providers (APPs -  Physician Assistants and Nurse Practitioners) who all work together to provide you with the care you need, when you need it.  We recommend signing up for the patient portal called "MyChart".  Sign up information is provided on this After Visit Summary.  MyChart is used to connect with patients for Virtual Visits (Telemedicine).  Patients are able to view lab/test results, encounter notes, upcoming appointments, etc.  Non-urgent messages can be sent to your provider as well.   To learn more about what you can do with MyChart, go to [email or web address].    Your next appointment:   4 week(s)  The  format for your next appointment:   In Person  Provider:   [name], MD    Other Instructions N/A

## 2021-03-23 NOTE — Progress Notes (Signed)
Cardiology Office Note   Date:  03/23/2021   ID:  [name], DOB 1941/01/24, MRN [id number]  PCP:  Owens Loffler, MD  Cardiologist:   [name], MD   Chief Complaint  Patient presents with   Other    Post cath no complaints today. Meds reviewed verbally with pt.      History of Present Illness: [name] is a 80 y.o. male who presents for a followup visit regarding extensive peripheral arterial disease with previous stenting of the left subclavian artery and left axillary artery, bilateral common iliac artery stenting and  left renal artery stenting. The patient has multiple medical problems including hyperlipidemia, Small AAA, long smoking history for 60 years, COPD , moderate carotid disease, and atherosclerotic coronary artery disease status post CABG in March of 2013 with LIMA to the LAD, vein graft to the PDA, vein graft to the OM. He does have mild aortic valve stenosis.  He had a small intracranial hemorrhage in 2014 which was suspected to be due to malignant hypertension and dual antiplatelet therapy. Shortly after that he was diagnosed with extensive right-sided DVT. Anticoagulation was contraindicated and thus an IVC filter was placed.   He had extensive bilateral occlusive DVT in 2018 which required catheter-based therapy.  He has been on anticoagulation since then.   He was seen recently for significant worsening of exertional dyspnea and bilateral leg claudication.  This correlated with the development of generalized rash that started in September.  The rash is in the upper extremities, the chest and abdomen area as well as the lower extremities.  It improved with systemic prednisone but then had significant worsening again when he finished prednisone course. Due to severe exertional fatigue and dyspnea, he underwent a recent right and left cardiac catheterization which showed severe native vessel disease with occluded left main and mid right coronary artery.   All his grafts were patent and normal.  Right heart catheterization showed moderately elevated wedge pressure, mild pulmonary hypertension and mildly reduced cardiac output.  Aortic stenosis was noted to be moderate to severe by echo but it was moderate by cath with mean gradient of 15 mmHg and valve area of 1.1 cm.  Based on these results, I asked him to resume furosemide at 20 mg every other day. He still has the same symptoms.  He is also limited by significant bilateral leg claudication which has also worsened significantly over the last few months.   Past Medical History:  Diagnosis Date   ([place]) heart failure with improved ejection fraction ([place])    a. 06/2011 Echo: EF 35-40%; b. 06/2012 Echo: EF 50%; c. 12/2016 Echo: EF 55-60%, no rwma, GRI DD, mild AS/MR, nl RV fxn, nl RVSP; d. 02/2021 Echo: EF 55%, GrII DD. Mild MR. Mild to mod TR. Sev AS by VTI.   AAA (abdominal aortic aneurysm)    a. 05/2020 Abd u/s: 3.6cm.   Aortic calcification ([facility]) 05/01/2013   Aortic stenosis    a. 02/2021 Echo: EF 55%, GrII DD. Sev Ca2+ of AoV. Sev AS w/ AVA by VTI 0.79cm^2.   Bilateral renal artery stenosis ([place])    a. 06/2013 Angio/PTA: LRA: 95 (5x18 Herculink stent), RRA 60ost, celiac/SMA nl, RCIA patent stents extending into dist Ao, RIIA 70ost, REIA min irregs, LCIA patent stent, LIIA 60-70ost, LEIA min irregs.   Coronary artery disease    a. 2013 s/p CABG x 3 (LIMA->LAD, VG->OM, VG->RPDA; b. 06/2012 MV: no ischemia.   History of  prior cigarette smoking 04/11/2008   Qualifier: Diagnosis of  By: Diona Browner MD, Amy     Hypertension    Hypothyroidism    Intraventricular hemorrhage ([place]) 07/12/2012   Ischemic cardiomyopathy    a. 06/2011 Echo: EF 35-40%; b. 06/2012 Echo: EF 50%; c. 12/2016 Echo: EF 55-60%, no rwma, GRI DD.   Peripheral arterial disease ([place])    a. Previous left lower extremity stenting by Dr. Jamal Collin;  b. 12/2012 s/p bilat ostial common iliac stenting; c. 05/2020 ABIs: R 0.74, L 0.73-->Stable. Patent  bilat common/ext iliacs by duplex.   Right leg DVT ([place]) 08/13/2012   Subclavian artery stenosis, left ([place]) 05/2012   Status post stenting of the ostium and self-expanding stent placement to the left axillary artery   Venous insufficiency     Past Surgical History:  Procedure Laterality Date   ABDOMINAL ANGIOGRAM  07/10/2013   WITH BI-FEMORAL RUNOFF       DR Fletcher Anon   ABDOMINAL AORTAGRAM N/A 12/26/2012   Procedure: ABDOMINAL Maxcine Ham;  Surgeon: Wellington Hampshire, MD;  Location: [facility] CATH [facility];  Service: Cardiovascular;  Laterality: N/A;   ABDOMINAL AORTAGRAM N/A 07/10/2013   Procedure: ABDOMINAL Maxcine Ham;  Surgeon: Wellington Hampshire, MD;  Location: [facility] CATH [facility];  Service: Cardiovascular;  Laterality: N/A;   ANGIOPLASTY / STENTING FEMORAL  05/2012   ANGIOPLASTY / STENTING ILIAC Bilateral 12/26/2012   ARCH AORTOGRAM N/A 06/20/2012   Procedure: ARCH AORTOGRAM;  Surgeon: Wellington Hampshire, MD;  Location: [facility] CATH [facility];  Service: Cardiovascular;  Laterality: N/A;   CARDIAC CATHETERIZATION  2/14   [place]   CHOLECYSTECTOMY  1990's   CORONARY ARTERY BYPASS GRAFT  07/11/2011   Procedure: CORONARY ARTERY BYPASS GRAFTING (CABG);  Surgeon: Grace Isaac, MD;  Location: [facility];  Service: Open Heart Surgery;  Laterality: N/A;  Times 3. On Pump. Using right greater saphenous vein and left internal mammary artery.    HERNIA REPAIR     INSERTION OF ILIAC STENT Bilateral 12/26/2012   Procedure: INSERTION OF ILIAC STENT;  Surgeon: Wellington Hampshire, MD;  Location: [facility] CATH [facility];  Service: Cardiovascular;  Laterality: Bilateral;  Bilateral Common Iliac Artery   LEFT AND RIGHT HEART CATHETERIZATION WITH CORONARY ANGIOGRAM  07/07/2011   Procedure: LEFT AND RIGHT HEART CATHETERIZATION WITH CORONARY ANGIOGRAM;  Surgeon: Minna Merritts, MD;  Location: [facility] C[facility];  Service: Cardiovascular;;   LOWER EXTREMITY VENOGRAPHY Bilateral 12/30/2016   Procedure: Lower Extremity Venography;  Surgeon: Katha Cabal, MD;  Location: [facility];  Service: Cardiovascular;  Laterality: Bilateral;   PERCUTANEOUS STENT INTERVENTION  06/20/2012   Procedure: PERCUTANEOUS STENT INTERVENTION;  Surgeon: Wellington Hampshire, MD;  Location: [facility] C[facility];  Service: Cardiovascular;;   RENAL ARTERY ANGIOPLASTY Left 07/10/2013   DR Kritika Stukes   RIGHT/LEFT HEART CATH AND CORONARY/GRAFT ANGIOGRAPHY N/A 03/15/2021   Procedure: RIGHT/LEFT HEART CATH AND CORONARY/GRAFT ANGIOGRAPHY;  Surgeon: Wellington Hampshire, MD;  Location: [facility];  Service: Cardiovascular;  Laterality: N/A;   SUBCLAVIAN ARTERY STENT  05/2012   "2 stents" (12/26/2012)   TOOTH EXTRACTION  Spring 2017   mulitple (6)   VENA CAVA FILTER PLACEMENT  07/2012   Removable     Current Outpatient Medications  Medication Sig Dispense Refill   amLODipine (NORVASC) 5 MG tablet TAKE 1 TABLET BY MOUTH EVERY DAY 90 tablet 3   atorvastatin (LIPITOR) 20 MG tablet TAKE 1 TABLET BY MOUTH EVERY DAY 90 tablet 0   carvedilol (COREG) 6.25 MG  tablet TAKE 1 TABLET BY MOUTH TWICE A DAY WITH MEALS 180 tablet 3   furosemide (LASIX) 20 MG tablet Take 1 tablet (20 mg total) by mouth every other day. 30 tablet 6   levothyroxine (SYNTHROID) 150 MCG tablet TAKE ONE TABLET BY MOUTH EVERY DAY BEFORE BREAKFAST 90 tablet 3   triamcinolone cream (KENALOG) 0.1 % Apply 1 application topically 2 (two) times daily. For rash 453.6 g 1   warfarin (COUMADIN) 5 MG tablet Take 0.5-1 tablets (2.5-5 mg total) by mouth See admin instructions. TAKE 1/2 TABLET DAILY BY MOUTH EXCEPT TAKE 1 TABLET ON MON, WED, FRI OR AS DIRECTED ANTICOAGULATION CLINIC 105 tablet 1   No current facility-administered medications for this visit.    Allergies:   Plavix [clopidogrel bisulfate]    Social History:  The patient  reports that he quit smoking about 10 years ago. His smoking use included cigarettes. He has a 50.00 pack-year smoking history. He has never used smokeless tobacco. He reports that he does not drink alcohol and does  not use drugs.   Family History:  The patient's family history includes Alcohol abuse in his father; Cirrhosis in his father; Hypothyroidism in his sister.    ROS:  Please see the history of present illness.   Otherwise, review of systems are positive for none.   All other systems are reviewed and negative.    PHYSICAL EXAM: VS:  BP (!) 148/70 (BP Location: Left Arm, Patient Position: Sitting, Cuff Size: Normal)   Pulse 68   Ht 5\' 9"  (1.753 m)   Wt 176 lb 6 oz (80 kg)   SpO2 97%   BMI 26.05 kg/m  , BMI Body mass index is 26.05 kg/m. GEN: Well nourished, well developed, in no acute distress  HEENT: normal  Neck: no JVD or masses. There is left carotid bruit and a bruit in the left subclavian artery area. Cardiac: RRR; no , rubs, or gallops,no edema . 2/6 systolic ejection murmur in the aortic area Respiratory:  clear to auscultation bilaterally, normal work of breathing GI: soft, nontender, nondistended, + BS MS: no deformity or atrophy  Skin: warm and dry, no rash Neuro:  Strength and sensation are intact Psych: euthymic mood, full affect Vascular: No hematoma from the left radial artery area.   EKG:  EKG is ordered today. The ekg ordered today demonstrates normal sinus rhythm with incomplete right bundle branch block.   Recent Labs: 04/30/2020: TSH 1.88 02/12/2021: ALT 11; B Natriuretic Peptide 632.7 03/09/2021: BUN 15; Creatinine, Ser 1.18; Hemoglobin 15.7; Platelets 230; Potassium 5.2; Sodium 139    Lipid Panel    Component Value Date/Time   CHOL 131 04/30/2020 1157   CHOL 125 01/31/2014 0756   TRIG 92.0 04/30/2020 1157   HDL 61.90 04/30/2020 1157   HDL 63 01/31/2014 0756   CHOLHDL 2 04/30/2020 1157   VLDL 18.4 04/30/2020 1157   LDLCALC 50 04/30/2020 1157   LDLCALC 46 01/31/2014 0756      Wt Readings from Last 3 Encounters:  03/23/21 176 lb 6 oz (80 kg)  03/15/21 171 lb (77.6 kg)  03/09/21 178 lb 4 oz (80.9 kg)        ASSESSMENT AND PLAN:   1.  Coronary artery disease involving native coronary arteries without angina: Recent cardiac catheterization showed patent grafts.  Continue medical therapy.  2. Bilateral carotid artery stenosis: Carotid Doppler last year showed moderate left carotid stenosis.  I requested a follow-up study.  3.  Bilateral renal artery  stenosis: Status post left renal artery stenting.  Most recent duplex did not show clear evidence of disease bilaterally although the left side was not well visualized.  4. Abdominal aortic aneurysm: This was small on most recent ultrasound .  5 . Essential hypertension: Blood pressure is more controlled since increasing the dose of amlodipine.  6. Peripheral arterial disease with previous iliac stenting.  He reports significant worsening bilateral leg weakness with exertion.  I requested an ABI and aortoiliac duplex.  7.  Status post left subclavian artery and left axillary artery stenting.  Stents were patent on recent cardiac cath angiography.  8.  History of previous extensive lower extremity DVT.  Currently on lifelong anticoagulation with warfarin .  9.  Hyperlipidemia: Continue treatment with atorvastatin.  I reviewed most recent lipid profile which showed an LDL of 50.  10.  Chronic diastolic heart failure: There was evidence of volume overload on right heart catheterization recently.  Since then, I resumed small dose furosemide.  We have to monitor renal function closely as he did have elevated creatinine on daily dosing of furosemide.  11.  Generalized rash with appearance of palpable purpura especially on the lower extremities.  This might be suggestive of a systemic process like vasculitis.  I suspect that the patient requires an urgent biopsy.  Dermatology access seems to be limited.  I will reach out to Dr. [name] for suggestions.   Disposition:   Follow-up with me in 1 month.  Signed,  [name], MD  03/23/2021 1:43 PM    [facility]

## 2021-03-24 ENCOUNTER — Encounter: Payer: Self-pay | Admitting: Family Medicine

## 2021-03-24 ENCOUNTER — Ambulatory Visit (INDEPENDENT_AMBULATORY_CARE_PROVIDER_SITE_OTHER): Payer: Medicare Other | Admitting: Family Medicine

## 2021-03-24 VITALS — BP 130/70 | HR 64 | Temp 98.5°F | Ht 69.0 in | Wt 179.4 lb

## 2021-03-24 DIAGNOSIS — R0602 Shortness of breath: Secondary | ICD-10-CM | POA: Diagnosis not present

## 2021-03-24 DIAGNOSIS — R29898 Other symptoms and signs involving the musculoskeletal system: Secondary | ICD-10-CM | POA: Diagnosis not present

## 2021-03-24 DIAGNOSIS — D692 Other nonthrombocytopenic purpura: Secondary | ICD-10-CM

## 2021-03-24 DIAGNOSIS — J449 Chronic obstructive pulmonary disease, unspecified: Secondary | ICD-10-CM | POA: Diagnosis not present

## 2021-03-24 DIAGNOSIS — R0609 Other forms of dyspnea: Secondary | ICD-10-CM

## 2021-03-24 DIAGNOSIS — R21 Rash and other nonspecific skin eruption: Secondary | ICD-10-CM

## 2021-03-24 DIAGNOSIS — I255 Ischemic cardiomyopathy: Secondary | ICD-10-CM

## 2021-03-24 DIAGNOSIS — M79605 Pain in left leg: Secondary | ICD-10-CM | POA: Diagnosis not present

## 2021-03-24 DIAGNOSIS — M79604 Pain in right leg: Secondary | ICD-10-CM

## 2021-03-24 MED ORDER — BUDESONIDE-FORMOTEROL FUMARATE 160-4.5 MCG/ACT IN AERO: 2.0000 | INHALATION_SPRAY | Freq: Two times a day (BID) | RESPIRATORY_TRACT | 3 refills | Status: DC

## 2021-03-24 MED ORDER — PREDNISONE 20 MG PO TABS: ORAL_TABLET | ORAL | 0 refills | Status: DC

## 2021-03-24 NOTE — Progress Notes (Signed)
[name] Aho T. Elenor Wildes, MD, [name] [place], [address]  Phone: [phone]  FAX: [phone]  [name] - 80 y.o. male  MRN [id number]  Date of Birth: 1940/08/06  Date: 03/24/2021  PCP: Owens Loffler, MD  Referral: Owens Loffler, MD  Chief Complaint  Patient presents with   Rash    Arm & Legs-Started in September and is getting worse   Shortness of Breath    Became worse when rash broke out   Cramps in Lower Legs    This visit occurred during the SARS-CoV-2 public health emergency.  Safety protocols were in place, including screening questions prior to the visit, additional usage of staff PPE, and extensive cleaning of exam room while observing appropriate contact time as indicated for disinfecting solutions.   Subjective:   CLARANCE [name] is a 80 y.o. very pleasant male patient with Body mass index is 26.49 kg/m. who presents with the following:  [name] is here to follow-up about a rash.  Dr. Fletcher Anon sent me a note, and he was worried about more of a systemic process such as vasculitis.  Reviewed complicated vascular history from Dr. Fletcher Anon.  He has a very extensive peripheral vascular disease history including stents of the left subclavian, left axillary, bilateral common iliac arteries, left renal artery.  He also has COPD and has had CABG in 2013. He also had a intracranial hemorrhage in 2014. He also had a prior extensive DVT around that time and he also had an IVC. He also had another extensive DVT in 2018. He is on chronic Coumadin now.  I initially saw him with what I presume was a COPD accessioned, and that he has had a very long history of heavy smoking and known emphysema.  He is not been on any regular inhalers for a long time.  He has had intermittent usage of prednisone with resolution of COPD exacerbations.  On February 12, 2021, the patient did have a CT angiogram of the chest,  there was no clot, with the only finding of significance would be emphysema and arterial calcification, which were both known.  At that time, he did have a rash but it was fairly small and localized to the leg.  At this point this is globally spread very rapidly and he and his wife relate the history that as the rapid expansion of the rashes occur then he has become more weak in his extremities, significantly short of breath that is been progressive along with the rash, he also has a burning itch that is diffuse throughout the body.  Currently, he is unable to walk 100 yards, this is very different compared to his baseline.  Palpable purpura on exam?  Rash started in Aug 2022, last saw Dr. Einar Pheasant 01/2021. TAC cream - notes say results good in 01/2021 from TAC  SOB work-up, hosp, cath by Dr. Fletcher Anon Some CHF, added lasix Grafts are patent on his cardiac cath  Burning itch and pain, now going everywhere.  As the rash has gotten worse, the weakness in his legs has gotten worse and he is unsteady.  Cannot walk more than 100 yards.  Legs are giving out and   Wife called [facility], they could have seen him today   Pred did help when he got some for SOB.  Dermatology urgent consult.  Review of Systems is noted in the HPI, as appropriate  Patient Active Problem List  Diagnosis Date Noted   CKD (chronic kidney disease) stage 3, GFR 30-59 ml/min (HCC) 05/01/2013    Priority: High   Intraventricular hemorrhage ([place]) 07/12/2012    Priority: High   Ischemic cardiomyopathy 07/08/2011    Priority: High   CAD (coronary artery disease), native coronary artery 07/07/2011    Priority: High   COPD (chronic obstructive pulmonary disease) ([place]) 01/08/2010    Priority: High   Bilateral renal artery stenosis (HCC)     Priority: Medium    HTN (hypertension) 07/15/2012    Priority: Medium    Peripheral arterial disease ([place])     Priority: Medium    Hypothyroidism 09/07/2006    Priority: Medium     Aortic valve stenosis    Long term (current) use of anticoagulants 01/27/2017   DVT (deep venous thrombosis) (HCC) 12/29/2016   Paroxysmal a-fib: briefly in ED now in NSR 05/24/2013   Aortic calcification ([place]) 05/01/2013   Subclavian arterial stenosis ([place]) 04/25/2011   BPH (benign prostatic hyperplasia) 12/29/2010   Abdominal aortic aneurysm 11/06/2009   HYPERCHOLESTEROLEMIA 10/09/2009   History of prior cigarette smoking 04/11/2008    Past Medical History:  Diagnosis Date   (HFimpEF) heart failure with improved ejection fraction ([place])    a. 06/2011 Echo: EF 35-40%; b. 06/2012 Echo: EF 50%; c. 12/2016 Echo: EF 55-60%, no rwma, GRI DD, mild AS/MR, nl RV fxn, nl RVSP; d. 02/2021 Echo: EF 55%, GrII DD. Mild MR. Mild to mod TR. Sev AS by VTI.   AAA (abdominal aortic aneurysm)    a. 05/2020 Abd u/s: 3.6cm.   Aortic calcification (HCC) 05/01/2013   Aortic stenosis    a. 02/2021 Echo: EF 55%, GrII DD. Sev Ca2+ of AoV. Sev AS w/ AVA by VTI 0.79cm^2.   Bilateral renal artery stenosis ([place])    a. 06/2013 Angio/PTA: LRA: 95 (5x18 Herculink stent), RRA 60ost, celiac/SMA nl, RCIA patent stents extending into dist Ao, RIIA 70ost, REIA min irregs, LCIA patent stent, LIIA 60-70ost, LEIA min irregs.   Coronary artery disease    a. 2013 s/p CABG x 3 (LIMA->LAD, VG->OM, VG->RPDA; b. 06/2012 MV: no ischemia.   History of prior cigarette smoking 04/11/2008   Qualifier: Diagnosis of  By: Diona Browner MD, Amy     Hypertension    Hypothyroidism    Intraventricular hemorrhage ([place]) 07/12/2012   Ischemic cardiomyopathy    a. 06/2011 Echo: EF 35-40%; b. 06/2012 Echo: EF 50%; c. 12/2016 Echo: EF 55-60%, no rwma, GRI DD.   Peripheral arterial disease ([place])    a. Previous left lower extremity stenting by Dr. Jamal Collin;  b. 12/2012 s/p bilat ostial common iliac stenting; c. 05/2020 ABIs: R 0.74, L 0.73-->Stable. Patent bilat common/ext iliacs by duplex.   Right leg DVT ([place]) 08/13/2012   Subclavian artery stenosis, left ([place])  05/2012   Status post stenting of the ostium and self-expanding stent placement to the left axillary artery   Venous insufficiency     Past Surgical History:  Procedure Laterality Date   ABDOMINAL ANGIOGRAM  07/10/2013   WITH BI-FEMORAL RUNOFF       DR Fletcher Anon   ABDOMINAL AORTAGRAM N/A 12/26/2012   Procedure: ABDOMINAL Maxcine Ham;  Surgeon: Wellington Hampshire, MD;  Location: [facility] CATH LAB;  Service: Cardiovascular;  Laterality: N/A;   ABDOMINAL AORTAGRAM N/A 07/10/2013   Procedure: ABDOMINAL Maxcine Ham;  Surgeon: Wellington Hampshire, MD;  Location: [facility] CATH LAB;  Service: Cardiovascular;  Laterality: N/A;   ANGIOPLASTY / STENTING FEMORAL  05/2012   ANGIOPLASTY /  STENTING ILIAC Bilateral 12/26/2012   ARCH AORTOGRAM N/A 06/20/2012   Procedure: ARCH AORTOGRAM;  Surgeon: Wellington Hampshire, MD;  Location: [facility] CATH LAB;  Service: Cardiovascular;  Laterality: N/A;   CARDIAC CATHETERIZATION  2/14   [place]   CHOLECYSTECTOMY  1990's   CORONARY ARTERY BYPASS GRAFT  07/11/2011   Procedure: CORONARY ARTERY BYPASS GRAFTING (CABG);  Surgeon: Grace Isaac, MD;  Location: [facility];  Service: Open Heart Surgery;  Laterality: N/A;  Times 3. On Pump. Using right greater saphenous vein and left internal mammary artery.    HERNIA REPAIR     INSERTION OF ILIAC STENT Bilateral 12/26/2012   Procedure: INSERTION OF ILIAC STENT;  Surgeon: Wellington Hampshire, MD;  Location: [facility] CATH LAB;  Service: Cardiovascular;  Laterality: Bilateral;  Bilateral Common Iliac Artery   LEFT AND RIGHT HEART CATHETERIZATION WITH CORONARY ANGIOGRAM  07/07/2011   Procedure: LEFT AND RIGHT HEART CATHETERIZATION WITH CORONARY ANGIOGRAM;  Surgeon: Minna Merritts, MD;  Location: [facility];  Service: Cardiovascular;;   LOWER EXTREMITY VENOGRAPHY Bilateral 12/30/2016   Procedure: Lower Extremity Venography;  Surgeon: Katha Cabal, MD;  Location: [facility] CV LAB;  Service: Cardiovascular;  Laterality: Bilateral;   PERCUTANEOUS STENT INTERVENTION  06/20/2012    Procedure: PERCUTANEOUS STENT INTERVENTION;  Surgeon: Wellington Hampshire, MD;  Location: [facility] CATH LAB;  Service: Cardiovascular;;   RENAL ARTERY ANGIOPLASTY Left 07/10/2013   DR ARIDA   RIGHT/LEFT HEART CATH AND CORONARY/GRAFT ANGIOGRAPHY N/A 03/15/2021   Procedure: RIGHT/LEFT HEART CATH AND CORONARY/GRAFT ANGIOGRAPHY;  Surgeon: Wellington Hampshire, MD;  Location: [facility] CV LAB;  Service: Cardiovascular;  Laterality: N/A;   SUBCLAVIAN ARTERY STENT  05/2012   "2 stents" (12/26/2012)   TOOTH EXTRACTION  Spring 2017   mulitple (6)   VENA CAVA FILTER PLACEMENT  07/2012   Removable    Family History  Problem Relation Age of Onset   Alcohol abuse Father    Cirrhosis Father    Hypothyroidism Sister      Objective:   BP 130/70   Pulse 64   Temp 98.5 F (36.9 C) (Temporal)   Ht _0  (1.753 m)   Wt 179 lb 6 oz (81.4 kg)   SpO2 99%   BMI 26.49 kg/m   GEN: No acute distress; alert,appropriate. PULM: Breathing comfortably in no respiratory distress PSYCH: Normally interactive.  CV: RRR, no m/g/r  PULM: Normal respiratory rate, no accessory muscle use.  Mild wheezes at the bases bilaterally, crackles or rhonchi            Laboratory and Imaging Data:  Lab Review:  BNP (last 3 results) Recent Labs    02/12/21 1701  BNP 632.7*      CBC EXTENDED Latest Ref Rng & Units 03/09/2021 02/12/2021 04/30/2020  WBC 3.4 - 10.8 x10E3/uL 5.4 7.6 6.1  RBC 4.14 - 5.80 x10E6/uL 5.41 5.63 5.32  HGB 13.0 - 17.7 g/dL 15.7 16.5 15.8  HCT 37.5 - 51.0 % 47.3 51.0 47.7  PLT 150 - 450 x10E3/uL 230 210 185.0  NEUTROABS 1.4 - 7.0 x10E3/uL 3.5 4.2 3.1  LYMPHSABS 0.7 - 3.1 x10E3/uL 1.1 2.2 2.1    BMP Latest Ref Rng & Units 03/09/2021 02/26/2021 02/15/2021  Glucose 70 - 99 mg/dL 82 66(L) 101(H)  BUN 8 - 27 mg/dL 15 33(H) 33(H)  Creatinine 0.76 - 1.27 mg/dL 1.18 1.61(H) 1.42  BUN/Creat Ratio 10 - _1 -  Sodium 134 - 144 mmol/L 139 143 143  Potassium 3.5 - 5.2 mmol/L 5.2 4.4 3.9  Chloride  96 - 106 mmol/L 103 101 103  CO2 20 - 29 mmol/L _0 Calcium 8.6 - 10.2 mg/dL 9.2 9.3 9.1    Hepatic Function Latest Ref Rng & Units 02/12/2021 04/30/2020 01/14/2019  Total Protein 6.5 - 8.1 g/dL 8.0 6.7 7.2  Albumin 3.5 - 5.0 g/dL 4.6 4.5 4.6  AST 15 - 41 U/L _1 ALT 0 - 44 U/L _2 Alk Phosphatase 38 - 126 U/L 99 85 95  Total Bilirubin 0.3 - 1.2 mg/dL 1.5(H) 0.5 0.8  Bilirubin, Direct 0.0 - 0.3 mg/dL - 0.1 0.2    Lab Results  Component Value Date   CHOL 131 04/30/2020   Lab Results  Component Value Date   HDL 61.90 04/30/2020   Lab Results  Component Value Date   LDLCALC 50 04/30/2020   Lab Results  Component Value Date   TRIG 92.0 04/30/2020   Lab Results  Component Value Date   CHOLHDL 2 04/30/2020   No results for input(s): PSA in the last 72 hours. No results found for: HCVAB No results found for: VD25OH   Lab Results  Component Value Date   HGBA1C 5.3 04/30/2020   HGBA1C 5.4 07/11/2011   Lab Results  Component Value Date   LDLCALC 50 04/30/2020   CREATININE 1.18 03/09/2021      CARDIAC CATHETERIZATION  Result Date: 03/15/2021   Ost LM to Dist LM lesion is 100% stenosed.   Prox RCA to Mid RCA lesion is 100% stenosed.   Ost RCA to Prox RCA lesion is 90% stenosed.   SVG graft was visualized by angiography and is normal in caliber.   SVG graft was visualized by angiography and is normal in caliber.   LIMA graft was visualized by angiography and is normal in caliber.   The graft exhibits no disease.   The graft exhibits no disease. 1.  Occluded native vessels including left main and mid right coronary artery.  Patent and normal grafts including SVG to right PDA, SVG to OM 2 and free LIMA to LAD which is anastomosed to the proximal portion of SVG to OM. 2.  Moderate aortic stenosis with mean gradient of 15 mmHg and valve area of 1.1 cm. 3.  Right heart catheterization showed moderately elevated wedge pressure, mild pulmonary hypertension and mildly  reduced cardiac output. Recommendations: Continue medical therapy for coronary artery disease. Aortic stenosis does not require intervention at this point. There is evidence of diastolic heart failure with elevated filling pressures.  We will resume furosemide at 20 mg every other day. The patient reports a lot of calf claudication bilaterally and will evaluate with outpatient Dopplers.    CLINICAL DATA:  Short of breath for 4 days, cough   EXAM: CT ANGIOGRAPHY CHEST WITH CONTRAST   TECHNIQUE: Multidetector CT imaging of the chest was performed using the standard protocol during bolus administration of intravenous contrast. Multiplanar CT image reconstructions and MIPs were obtained to evaluate the vascular anatomy.   CONTRAST:  63m OMNIPAQUE IOHEXOL 350 MG/ML SOLN   COMPARISON:  02/12/2021, 09/13/2012   FINDINGS: Cardiovascular: This is a technically adequate evaluation of the pulmonary vasculature. There are no filling defects or pulmonary emboli.   The heart is unremarkable without pericardial effusion. Postsurgical changes from previous CABG. Normal caliber of the thoracic aorta, with severe atherosclerosis noted throughout the aorta and coronary vasculature.   Mediastinum/Nodes: No enlarged mediastinal, hilar,  or axillary lymph nodes. Thyroid gland, trachea, and esophagus demonstrate no significant findings.   Lungs/Pleura: Upper lobe predominant emphysema. There is a small amount of pleural fluid loculated within the left major fissure. Otherwise no effusion or pneumothorax. No acute airspace disease. Central airways are patent.   Upper Abdomen: No acute abnormality.   Musculoskeletal: No acute or destructive bony lesions. Reconstructed images demonstrate no additional findings.   Review of the MIP images confirms the above findings.   IMPRESSION: 1. No evidence of pulmonary embolus. 2. Small amount of pleural fluid within the left major fissure. 3. Aortic  Atherosclerosis (ICD10-I70.0) and Emphysema (ICD10-J43.9).     Electronically Signed   By: Randa Ngo M.D.   On: 02/12/2021 18:30  Assessment and Plan:     ICD-10-CM   1. SOB (shortness of breath)  R06.02 Ambulatory referral to Dermatology    CBC with Differential/Platelet    Basic metabolic panel    Hepatic function panel    Sedimentation rate    Urinalysis, Routine w reflex microscopic    C-reactive protein    CANCELED: Cryoglobulin    2. DOE (dyspnea on exertion)  R06.09 Ambulatory referral to Dermatology    CBC with Differential/Platelet    Basic metabolic panel    Hepatic function panel    Sedimentation rate    Urinalysis, Routine w reflex microscopic    C-reactive protein    CANCELED: Cryoglobulin    3. Purpura ([place])  D69.2 Ambulatory referral to Dermatology    CBC with Differential/Platelet    Basic metabolic panel    Hepatic function panel    Sedimentation rate    Urinalysis, Routine w reflex microscopic    C-reactive protein    CANCELED: Cryoglobulin    4. COPD mixed type ([place])  J44.9 Ambulatory referral to Dermatology    CBC with Differential/Platelet    Basic metabolic panel    Hepatic function panel    Sedimentation rate    Urinalysis, Routine w reflex microscopic    C-reactive protein    CANCELED: Cryoglobulin    5. Leg pain, bilateral  M79.604 Ambulatory referral to Dermatology   M79.605 CBC with Differential/Platelet    Basic metabolic panel    Hepatic function panel    Sedimentation rate    Urinalysis, Routine w reflex microscopic    C-reactive protein    CANCELED: Cryoglobulin    6. Rash  R21 Ambulatory referral to Dermatology    CBC with Differential/Platelet    Basic metabolic panel    Hepatic function panel    Sedimentation rate    Urinalysis, Routine w reflex microscopic    C-reactive protein    CANCELED: Cryoglobulin    7. Leg weakness, bilateral  R29.898 Ambulatory referral to Dermatology    CBC with Differential/Platelet     Basic metabolic panel    Hepatic function panel    Sedimentation rate    Urinalysis, Routine w reflex microscopic    C-reactive protein    CANCELED: Cryoglobulin    8. Ischemic cardiomyopathy  I25.5      Total encounter time: 60 minutes. This includes total time spent on the day of encounter.    Progressive shortness of breath, weakness, decreased walking, pain in the legs, painful, itchy rash that all coincide with worsening.  Very recently the rash has been rapidly expanding.  Extensive cardiac and vascular work-up which does not find an answer.  Patent CABG grafts.  Mild heart failure.  Dr. Fletcher Anon did add some Lasix.  COPD.  At baseline, the patient does not even need baseline inhalers.  I am going to add some Symbicort to see if this will make a difference right now as well as oral steroids.   Dr. Fletcher Anon and I spoke, and I agree and am worried that this is a systemic process that is involving multiple parts of the body.  I think that he does need an urgent dermatological assessment, potential biopsy, and help to rule in or out vasculitis.  Additional etiologies of a dermatological manifestation also need to be evaluated.  I appreciate their help.  I am going to ask them to see the patient urgently.  Check labs as above.  If there is not a clear answer, then I will likely need to involve some of my other colleagues.  Meds ordered this encounter  Medications   budesonide-formoterol (SYMBICORT) 160-4.5 MCG/ACT inhaler    Sig: Inhale 2 puffs into the lungs 2 (two) times daily.    Dispense:  1 each    Refill:  3   predniSONE (DELTASONE) 20 MG tablet    Sig: 2 tabs po daily for 5 days, then 1 tab po daily for 5 days    Dispense:  15 tablet    Refill:  0   There are no discontinued medications. Orders Placed This Encounter  Procedures   CBC with Differential/Platelet   Basic metabolic panel   Hepatic function panel   Sedimentation rate   Urinalysis, Routine w reflex microscopic    C-reactive protein   Ambulatory referral to Dermatology    Follow-up: No follow-ups on file.  [employer] speech-to-text software was used for transcription in this dictation.  Possible transcriptional errors can occur using Editor, commissioning.   Signed,  Maud Deed. Laiyah Exline, MD   Outpatient Encounter Medications as of 03/24/2021  Medication Sig   amLODipine (NORVASC) 5 MG tablet TAKE 1 TABLET BY MOUTH EVERY DAY   atorvastatin (LIPITOR) 20 MG tablet TAKE 1 TABLET BY MOUTH EVERY DAY   budesonide-formoterol (SYMBICORT) 160-4.5 MCG/ACT inhaler Inhale 2 puffs into the lungs 2 (two) times daily.   carvedilol (COREG) 6.25 MG tablet TAKE 1 TABLET BY MOUTH TWICE A DAY WITH MEALS   furosemide (LASIX) 20 MG tablet Take 1 tablet (20 mg total) by mouth every other day.   levothyroxine (SYNTHROID) 150 MCG tablet TAKE ONE TABLET BY MOUTH EVERY DAY BEFORE BREAKFAST   predniSONE (DELTASONE) 20 MG tablet 2 tabs po daily for 5 days, then 1 tab po daily for 5 days   triamcinolone cream (KENALOG) 0.1 % Apply 1 application topically 2 (two) times daily. For rash   warfarin (COUMADIN) 5 MG tablet Take 0.5-1 tablets (2.5-5 mg total) by mouth See admin instructions. TAKE 1/2 TABLET DAILY BY MOUTH EXCEPT TAKE 1 TABLET ON MON, WED, FRI OR AS DIRECTED ANTICOAGULATION CLINIC   [DISCONTINUED] Fluticasone-Salmeterol (ADVAIR DISKUS) 250-50 MCG/DOSE AEPB Inhale 1 puff into the lungs 2 (two) times daily.   No facility-administered encounter medications on file as of 03/24/2021.

## 2021-03-24 NOTE — Progress Notes (Signed)
Thanks, Armstrong.  I am going to check him out today in the office.

## 2021-03-25 ENCOUNTER — Other Ambulatory Visit: Payer: Self-pay

## 2021-03-25 ENCOUNTER — Ambulatory Visit (INDEPENDENT_AMBULATORY_CARE_PROVIDER_SITE_OTHER): Payer: Medicare Other | Admitting: Dermatology

## 2021-03-25 DIAGNOSIS — R21 Rash and other nonspecific skin eruption: Secondary | ICD-10-CM | POA: Diagnosis not present

## 2021-03-25 DIAGNOSIS — L308 Other specified dermatitis: Secondary | ICD-10-CM

## 2021-03-25 DIAGNOSIS — D485 Neoplasm of uncertain behavior of skin: Secondary | ICD-10-CM

## 2021-03-25 DIAGNOSIS — T7840XA Allergy, unspecified, initial encounter: Secondary | ICD-10-CM

## 2021-03-25 DIAGNOSIS — I255 Ischemic cardiomyopathy: Secondary | ICD-10-CM

## 2021-03-25 DIAGNOSIS — L820 Inflamed seborrheic keratosis: Secondary | ICD-10-CM | POA: Diagnosis not present

## 2021-03-25 LAB — CBC WITH DIFFERENTIAL/PLATELET
Basophils Absolute: 0.1 10*3/uL (ref 0.0–0.1)
Basophils Relative: 0.9 % (ref 0.0–3.0)
Eosinophils Absolute: 0.2 10*3/uL (ref 0.0–0.7)
Eosinophils Relative: 3.8 % (ref 0.0–5.0)
HCT: 46 % (ref 39.0–52.0)
Hemoglobin: 14.8 g/dL (ref 13.0–17.0)
Lymphocytes Relative: 32.6 % (ref 12.0–46.0)
Lymphs Abs: 2.2 10*3/uL (ref 0.7–4.0)
MCHC: 32.2 g/dL (ref 30.0–36.0)
MCV: 88.4 fl (ref 78.0–100.0)
Monocytes Absolute: 0.7 10*3/uL (ref 0.1–1.0)
Monocytes Relative: 10.3 % (ref 3.0–12.0)
Neutro Abs: 3.5 10*3/uL (ref 1.4–7.7)
Neutrophils Relative %: 52.4 % (ref 43.0–77.0)
Platelets: 250 10*3/uL (ref 150.0–400.0)
RBC: 5.21 Mil/uL (ref 4.22–5.81)
RDW: 15.5 % (ref 11.5–15.5)
WBC: 6.6 10*3/uL (ref 4.0–10.5)

## 2021-03-25 LAB — HEPATIC FUNCTION PANEL
ALT: 22 U/L (ref 0–53)
AST: 17 U/L (ref 0–37)
Albumin: 4.3 g/dL (ref 3.5–5.2)
Alkaline Phosphatase: 84 U/L (ref 39–117)
Bilirubin, Direct: 0.1 mg/dL (ref 0.0–0.3)
Total Bilirubin: 0.7 mg/dL (ref 0.2–1.2)
Total Protein: 6.7 g/dL (ref 6.0–8.3)

## 2021-03-25 LAB — BASIC METABOLIC PANEL
BUN: 18 mg/dL (ref 6–23)
CO2: 30 mEq/L (ref 19–32)
Calcium: 9.4 mg/dL (ref 8.4–10.5)
Chloride: 101 mEq/L (ref 96–112)
Creatinine, Ser: 1.39 mg/dL (ref 0.40–1.50)
GFR: 48.11 mL/min — ABNORMAL LOW (ref >60.00)
Glucose, Bld: 90 mg/dL (ref 70–99)
Potassium: 5 mEq/L (ref 3.5–5.1)
Sodium: 138 mEq/L (ref 135–145)

## 2021-03-25 LAB — URINALYSIS, ROUTINE W REFLEX MICROSCOPIC
Bilirubin Urine: NEGATIVE
Hgb urine dipstick: NEGATIVE
Ketones, ur: NEGATIVE
Leukocytes,Ua: NEGATIVE
Nitrite: NEGATIVE
Specific Gravity, Urine: 1.01 (ref 1.000–1.030)
Total Protein, Urine: NEGATIVE
Urine Glucose: NEGATIVE
Urobilinogen, UA: 0.2 (ref 0.0–1.0)
pH: 6.5 (ref 5.0–8.0)

## 2021-03-25 LAB — SEDIMENTATION RATE: Sed Rate: 15 mm/hr (ref 0–20)

## 2021-03-25 LAB — C-REACTIVE PROTEIN: CRP: <1 mg/dL (ref 0.5–20.0)

## 2021-03-25 MED ORDER — TRIAMCINOLONE ACETONIDE 0.1 % EX OINT: 1.0000 "application " | TOPICAL_OINTMENT | Freq: Every day | CUTANEOUS | 2 refills | Status: DC

## 2021-03-25 NOTE — Progress Notes (Signed)
New Patient Visit  Subjective  [name] is a 80 y.o. male who presents for the following: Rash.  Reviewed note from Dr. [name]: "[name] is here to follow-up about a rash.  Dr. Fletcher Anon sent me a note, and he was worried about more of a systemic process such as vasculitis.   Reviewed complicated vascular history from Dr. Fletcher Anon.  He has a very extensive peripheral vascular disease history including stents of the left subclavian, left axillary, bilateral common iliac arteries, left renal artery.  He also has COPD and has had CABG in 2013. He also had a intracranial hemorrhage in 2014. He also had a prior extensive DVT around that time and he also had an IVC. He also had another extensive DVT in 2018. He is on chronic Coumadin now.  I initially saw him with what I presume was a COPD accessioned, and that he has had a very long history of heavy smoking and known emphysema.  He is not been on any regular inhalers for a long time.  He has had intermittent usage of prednisone with resolution of COPD exacerbations.   On February 12, 2021, the patient did have a CT angiogram of the chest, there was no clot, with the only finding of significance would be emphysema and arterial calcification, which were both known.  At that time, he did have a rash but it was fairly small and localized to the leg.  At this point this is globally spread very rapidly and he and his wife relate the history that as the rapid expansion of the rashes occur then he has become more weak in his extremities, significantly short of breath that is been progressive along with the rash, he also has a burning itch that is diffuse throughout the body.   Currently, he is unable to walk 100 yards, this is very different compared to his baseline."   Today he and wife report that symptoms and rash started in Sept 2022 and gradually been worsening. And as above he showed no evidence of blood clots on Feb 12, 2021.   Objective   Well appearing patient in no apparent distress; mood and affect are within normal limits.  A focused examination was performed including arms, abdomen, chest, legs. Relevant physical exam findings are noted in the Assessment and Plan.  Left Abdomen (side) - Lower Purpuric crusted scaly lichenified patches         Left Antecubital 1.5 cm tan papule     left side Purpuric crusted scaly lichenified patch     Right Thigh Purpuric crusted scaly lichenified patch     Assessment & Plan  Rash -most consistent with atopic dermatitis. It does not initially appear consistent with vasculitis which is what his other physicians are most concerned about.  Reassured the patient and wife. Left Abdomen (side) - Lower  2 punch biopsies preformed today.  Start TMC ointment. Apply to affected areas daily.   Atopic dermatitis (eczema) is a chronic, relapsing, pruritic condition that can significantly affect quality of life. It is often associated with allergic rhinitis and/or asthma and can require treatment with topical medications, phototherapy, or in severe cases biologic injectable medication (Dupixent; Adbry) or Oral JAK inhibitors.  triamcinolone ointment (KENALOG) 0.1 % - Left Abdomen (side) - Lower Apply 1 application topically daily. To affected areas  Neoplasm of uncertain behavior of skin (3) Left Antecubital  Epidermal / dermal shaving  Lesion diameter (cm):  1.5 Informed consent: discussed and consent obtained  Timeout: patient name, date of birth, surgical site, and procedure verified   Procedure prep:  Patient was prepped and draped in usual sterile fashion Prep type:  Isopropyl alcohol Anesthesia: the lesion was anesthetized in a standard fashion   Anesthetic:  1% lidocaine w/ epinephrine 1-100,000 buffered w/ 8.4% NaHCO3 Instrument used: flexible razor blade   Hemostasis achieved with: pressure, aluminum chloride and electrodesiccation   Outcome: patient  tolerated procedure well   Post-procedure details: sterile dressing applied and wound care instructions given   Dressing type: bandage and petrolatum    Specimen 3 - Surgical pathology Differential Diagnosis: ISK vs other  Check Margins: No  left side Skin / nail biopsy Type of biopsy: punch   Informed consent: discussed and consent obtained   Timeout: patient name, date of birth, surgical site, and procedure verified   Procedure prep:  Patient was prepped and draped in usual sterile fashion (the patient was cleaned and prepped) Prep type:  Isopropyl alcohol Anesthesia: the lesion was anesthetized in a standard fashion   Anesthetic:  1% lidocaine w/ epinephrine 1-100,000 buffered w/ 8.4% NaHCO3 Punch size:  3 mm Suture size:  4-0 Suture type: nylon   Hemostasis achieved with: suture, pressure and aluminum chloride   Outcome: patient tolerated procedure well   Post-procedure details: sterile dressing applied and wound care instructions given   Dressing type: bandage, petrolatum and pressure dressing    Specimen 1 - Surgical pathology Differential Diagnosis: atopic derm vs other  Check Margins: No  Right Thigh Skin / nail biopsy Type of biopsy: punch   Informed consent: discussed and consent obtained   Timeout: patient name, date of birth, surgical site, and procedure verified   Procedure prep:  Patient was prepped and draped in usual sterile fashion (the patient was cleaned and prepped) Prep type:  Isopropyl alcohol Anesthesia: the lesion was anesthetized in a standard fashion   Anesthetic:  1% lidocaine w/ epinephrine 1-100,000 buffered w/ 8.4% NaHCO3 Punch size:  3 mm Suture size:  4-0 Suture type: nylon   Hemostasis achieved with: suture, pressure and aluminum chloride   Outcome: patient tolerated procedure well   Post-procedure details: sterile dressing applied and wound care instructions given   Dressing type: bandage, petrolatum and pressure dressing    Specimen 2  - Surgical pathology Differential Diagnosis: atopic derm vs other  Check Margins: No  Return in about 1 week (around 04/01/2021) for SR, biopsy f/u.  IHarriett Sine, CMA, am acting as scribe for Sarina Ser, MD. Documentation: I have reviewed the above documentation for accuracy and completeness, and I agree with the above.  Sarina Ser, MD

## 2021-03-25 NOTE — Patient Instructions (Signed)

## 2021-04-01 ENCOUNTER — Other Ambulatory Visit: Payer: Self-pay

## 2021-04-01 ENCOUNTER — Ambulatory Visit (INDEPENDENT_AMBULATORY_CARE_PROVIDER_SITE_OTHER): Payer: Medicare Other | Admitting: Dermatology

## 2021-04-01 DIAGNOSIS — L309 Dermatitis, unspecified: Secondary | ICD-10-CM

## 2021-04-01 DIAGNOSIS — L308 Other specified dermatitis: Secondary | ICD-10-CM

## 2021-04-01 DIAGNOSIS — R21 Rash and other nonspecific skin eruption: Secondary | ICD-10-CM

## 2021-04-01 DIAGNOSIS — T7840XD Allergy, unspecified, subsequent encounter: Secondary | ICD-10-CM

## 2021-04-01 MED ORDER — TRIAMCINOLONE ACETONIDE 0.1 % EX OINT: 1.0000 "application " | TOPICAL_OINTMENT | Freq: Two times a day (BID) | CUTANEOUS | 2 refills | Status: DC

## 2021-04-01 NOTE — Patient Instructions (Addendum)
Use Cerave after bath and shower  Continue triamcinolone twice daily for another 2 weeks to any areas, after 2 weeks if still active areas continue using 5 days weekly. Avoid applying to face, groin, and axilla. Use as directed. Long-term use can cause thinning of the skin.   Topical steroids (such as triamcinolone, fluocinolone, fluocinonide, mometasone, clobetasol, halobetasol, betamethasone, hydrocortisone) can cause thinning and lightening of the skin if they are used for too long in the same area. Your physician has selected the right strength medicine for your problem and area affected on the body. Please use your medication only as directed by your physician to prevent side effects.   Gentle Skin Care Guide  1. Bathe no more than once a day.  2. Avoid bathing in hot water  3. Use a mild soap like Dove, Vanicream, Cetaphil, CeraVe. Can use Lever 2000 or Cetaphil antibacterial soap  4. Use soap only where you need it. On most days, use it under your arms, between your legs, and on your feet. Let the water rinse other areas unless visibly dirty.  5. When you get out of the bath/shower, use a towel to gently blot your skin dry, don't rub it.  6. While your skin is still a little damp, apply a moisturizing cream such as Vanicream, CeraVe, Cetaphil, Eucerin, Sarna lotion or plain Vaseline Jelly. For hands apply Neutrogena [place] Hand Cream or Excipial Hand Cream.  7. Reapply moisturizer any time you start to itch or feel dry.  8. Sometimes using free and clear laundry detergents can be helpful. Fabric softener sheets should be avoided. Downy Free & Gentle liquid, or any liquid fabric softener that is free of dyes and perfumes, it acceptable to use  9. If your doctor has given you prescription creams you may apply moisturizers over them       If You Need Anything After Your Visit  If you have any questions or concerns for your doctor, please call our main line at [phone]  and press option 4 to reach your doctor's medical assistant. If no one answers, please leave a voicemail as directed and we will return your call as soon as possible. Messages left after 4 pm will be answered the following business day.   You may also send [place] a message via [place]. We typically respond to MyChart messages within 1-2 business days.  For prescription refills, please ask your pharmacy to contact our office. Our fax number is [phone].  If you have an urgent issue when the clinic is closed that cannot wait until the next business day, you can page your doctor at the number below.    Please note that while we do our best to be available for urgent issues outside of office hours, we are not available 24/7.   If you have an urgent issue and are unable to reach [place], you may choose to seek medical care at your doctor's office, retail clinic, urgent care center, or emergency room.  If you have a medical emergency, please immediately call 911 or go to the emergency department.  Pager Numbers  - Dr. Nehemiah Massed: [phone]  - Dr. Laurence Ferrari: [phone]  - Dr. Nicole Kindred: [phone]  In the event of inclement weather, please call our main line at [phone] for an update on the status of any delays or closures.  Dermatology Medication Tips: Please keep the boxes that topical medications come in in order to help keep track of the instructions about where and how to use  these. Pharmacies typically print the medication instructions only on the boxes and not directly on the medication tubes.   If your medication is too expensive, please contact our office at [phone] option 4 or send [place] a message through [place].   We are unable to tell what your co-pay for medications will be in advance as this is different depending on your insurance coverage. However, we may be able to find a substitute medication at lower cost or fill out paperwork to get insurance to cover a needed medication.    If a prior authorization is required to get your medication covered by your insurance company, please allow [place] 1-2 business days to complete this process.  Drug prices often vary depending on where the prescription is filled and some pharmacies may offer cheaper prices.  The website www.goodrx.com contains coupons for medications through different pharmacies. The prices here do not account for what the cost may be with help from insurance (it may be cheaper with your insurance), but the website can give you the price if you did not use any insurance.  - You can print the associated coupon and take it with your prescription to the pharmacy.  - You may also stop by our office during regular business hours and pick up a GoodRx coupon card.  - If you need your prescription sent electronically to a different pharmacy, notify our office through [facility] or by phone at [phone] option 4.     Si Usted Necesita Algo Despus de Su Visita  Tambin puede enviarnos un mensaje a travs de Pharmacist, community. Por lo general respondemos a los mensajes de MyChart en el transcurso de 1 a 2 das hbiles.  Para renovar recetas, por favor pida a su farmacia que se ponga en contacto con nuestra oficina. Harland Dingwall de fax es [place] [phone].  Si tiene un asunto urgente cuando la clnica est cerrada y que no puede esperar hasta el siguiente da hbil, puede llamar/localizar a su doctor(a) al nmero que aparece a continuacin.   Por favor, tenga en cuenta que aunque hacemos todo lo posible para estar disponibles para asuntos urgentes fuera del horario de [place], no estamos disponibles las 24 horas del da, los 7 das de la [place].   Si tiene un problema urgente y no puede comunicarse con nosotros, puede optar por buscar atencin mdica  en el consultorio de su doctor(a), en una clnica privada, en un centro de atencin urgente o en una sala de emergencias.  Si tiene Engineering geologist, por favor  llame inmediatamente al 911 o vaya a la sala de emergencias.  Nmeros de bper  - Dr. Nehemiah Massed: [phone]  - Dra. Moye: [phone]  - Dra. Nicole Kindred: [phone]  En caso de inclemencias del [place], por favor llame a Johnsie Kindred principal al [phone] para una actualizacin sobre el [place] de cualquier retraso o cierre.  Consejos para la medicacin en dermatologa: Por favor, guarde las cajas en las que vienen los medicamentos de uso tpico para ayudarle a seguir las instrucciones sobre dnde y cmo usarlos. Las farmacias generalmente imprimen las instrucciones del medicamento slo en las cajas y no directamente en los tubos del [place].   Si su medicamento es muy caro, por favor, pngase en contacto con Zigmund Daniel llamando al [phone] y presione la opcin 4 o envenos un mensaje a travs de Pharmacist, community.   No podemos decirle cul ser su copago por los medicamentos por adelantado ya que esto es diferente dependiendo de la  cobertura de su seguro. Sin embargo, es posible que podamos encontrar un medicamento sustituto a Electrical engineer un formulario para que el seguro cubra el medicamento que se considera necesario.   Si se requiere una autorizacin previa para que su compaa de seguros [place] su medicamento, por favor permtanos de 1 a 2 das hbiles para completar este proceso.  Los precios de los medicamentos varan con frecuencia dependiendo del Environmental consultant de dnde se surte la receta y alguna farmacias pueden ofrecer precios ms baratos.  El sitio web www.goodrx.com tiene cupones para medicamentos de Airline pilot. Los precios aqu no tienen en cuenta lo que podra costar con la ayuda del seguro (puede ser ms barato con su seguro), pero el sitio web puede darle el precio si no utiliz Research scientist (physical sciences).  - Puede imprimir el cupn correspondiente y llevarlo con su receta a la farmacia.  - Tambin puede pasar por nuestra oficina durante el horario de atencin regular y  Charity fundraiser una tarjeta de cupones de GoodRx.  - Si necesita que su receta se enve electrnicamente a una farmacia diferente, informe a nuestra oficina a travs de MyChart de [place] o por telfono llamando al [phone] y presione la opcin 4.

## 2021-04-01 NOTE — Progress Notes (Signed)
   Follow-Up Visit   Subjective  [name] is a 80 y.o. male who presents for the following: Follow-up (Patient here today for suture removal. Patient reports rash is doing much better. Biopsy at left abdomen. Was prescribed tmc 01 ointment to use. Area at right thigh has improved also.).  The following portions of the chart were reviewed this encounter and updated as appropriate:  Tobacco  Allergies  Meds  Problems  Med Hx  Surg Hx  Fam Hx     Review of Systems: No other skin or systemic complaints except as noted in HPI or Assessment and Plan.  Objective  Well appearing patient in no apparent distress; mood and affect are within normal limits.  A focused examination was performed including left abdomen and right thigh. Relevant physical exam findings are noted in the Assessment and Plan.   Assessment & Plan  Eczema and hypersensitivity reaction- with biopsy showing psoriasiform spongiotic dermatitis  And another biopsy showing dermal hypersensitivity reaction left abdomen and right thigh Advised the patient and wife that this is not consistent with vasculitis which is the biggest concern for the patient wife and his other physicians. He is improving on triamcinolone cream with significant clearing.  Continue Tmc 0.1 cream - apply topically twice daily to affected areas for 2 weeks then after apply to any affected areas for 5 days weekly   Topical steroids (such as triamcinolone, fluocinolone, fluocinonide, mometasone, clobetasol, halobetasol, betamethasone, hydrocortisone) can cause thinning and lightening of the skin if they are used for too long in the same area. Your physician has selected the right strength medicine for your problem and area affected on the body. Please use your medication only as directed by your physician to prevent side effects.   Encounter for Removal of Sutures - Incision site at the Left abdomen and right thigh is clean, dry and intact - Wound  cleansed, sutures removed, wound cleansed and steri strips applied.  - Discussed pathology results showing 1. Skin , left side DERMAL HYPERSENSITIVITY REACTION, SEE DESCRIPTION dermal hypersensitivity reaction , Medication vs bite etc.  2. Skin , right thigh  PSORIASIFORM SPONGIOTIC DERMATITIS, SEE DESCRIPTION Eczema vs Contact (spongiosis)   - Scars remodel for a full year. - Patient advised to call with any concerns or if they notice any new or changing lesions.  Related Medications triamcinolone ointment (KENALOG) 0.1 % Apply 1 application topically 2 (two) times daily. To affected areas for 2 weeks, after 2 weeks apply 5 days weekly to any affected areas  Return for 4 - 6 week follow up. IRuthell Rummage, CMA, am acting as scribe for Sarina Ser, MD. Documentation: I have reviewed the above documentation for accuracy and completeness, and I agree with the above.  Sarina Ser, MD

## 2021-04-06 ENCOUNTER — Encounter: Payer: Self-pay | Admitting: Dermatology

## 2021-04-09 ENCOUNTER — Ambulatory Visit: Payer: Medicare Other

## 2021-04-10 ENCOUNTER — Encounter: Payer: Self-pay | Admitting: Dermatology

## 2021-04-22 ENCOUNTER — Ambulatory Visit (INDEPENDENT_AMBULATORY_CARE_PROVIDER_SITE_OTHER): Payer: Medicare Other

## 2021-04-22 ENCOUNTER — Other Ambulatory Visit: Payer: Self-pay

## 2021-04-22 DIAGNOSIS — I739 Peripheral vascular disease, unspecified: Secondary | ICD-10-CM

## 2021-04-23 ENCOUNTER — Telehealth: Payer: Self-pay | Admitting: *Deleted

## 2021-04-23 NOTE — Telephone Encounter (Signed)
-----   Message from Wellington Hampshire, MD sent at 04/22/2021 10:00 PM EST ----- ABI is reduced compared to before although iliac stents seem patent. Keep follow up to discuss.

## 2021-04-23 NOTE — Telephone Encounter (Signed)
Left voicemail message to call back for review of results.  

## 2021-04-29 ENCOUNTER — Other Ambulatory Visit: Payer: Self-pay

## 2021-04-29 ENCOUNTER — Ambulatory Visit (INDEPENDENT_AMBULATORY_CARE_PROVIDER_SITE_OTHER): Payer: Medicare Other | Admitting: Cardiovascular Disease

## 2021-04-29 ENCOUNTER — Encounter: Payer: Self-pay | Admitting: Cardiovascular Disease

## 2021-04-29 ENCOUNTER — Telehealth: Payer: Self-pay | Admitting: Cardiovascular Disease

## 2021-04-29 VITALS — BP 140/62 | HR 73 | Ht 69.0 in | Wt 178.4 lb

## 2021-04-29 DIAGNOSIS — I771 Stricture of artery: Secondary | ICD-10-CM

## 2021-04-29 DIAGNOSIS — I701 Atherosclerosis of renal artery: Secondary | ICD-10-CM | POA: Diagnosis not present

## 2021-04-29 DIAGNOSIS — I2510 Atherosclerotic heart disease of native coronary artery without angina pectoris: Secondary | ICD-10-CM

## 2021-04-29 DIAGNOSIS — I10 Essential (primary) hypertension: Secondary | ICD-10-CM

## 2021-04-29 DIAGNOSIS — I739 Peripheral vascular disease, unspecified: Secondary | ICD-10-CM | POA: Diagnosis not present

## 2021-04-29 DIAGNOSIS — I6523 Occlusion and stenosis of bilateral carotid arteries: Secondary | ICD-10-CM | POA: Diagnosis not present

## 2021-04-29 DIAGNOSIS — I714 Abdominal aortic aneurysm, without rupture, unspecified: Secondary | ICD-10-CM | POA: Diagnosis not present

## 2021-04-29 MED ORDER — ASPIRIN EC 81 MG PO TBEC: 81.0000 mg | DELAYED_RELEASE_TABLET | Freq: Every day | ORAL | Status: DC

## 2021-04-29 NOTE — Patient Instructions (Signed)
Medication Instructions:  Your physician has recommended you make the following change in your medication:   START Aspirin 81 mg daily. This can be purchased over the counter.  *If you need a refill on your cardiac medications before your next appointment, please call your pharmacy*   Lab Work: Your physician recommends that you return for lab work in (bmp, cbc): 1 week  If you have labs (blood work) drawn today and your tests are completely normal, you will receive your results only by: MyChart Message (if you have MyChart) OR A paper copy in the mail If you have any lab test that is abnormal or we need to change your treatment, we will call you to review the results.   Testing/Procedures: Dr. Fletcher Anon has recommended that you have an Abdominal Aortogram. Please see pre-procedure instructions below.   Follow-Up: At [facility], you and your health needs are our priority.  As part of our continuing mission to provide you with exceptional heart care, we have created designated Provider Care Teams.  These Care Teams include your primary Cardiologist (physician) and Advanced Practice Providers (APPs -  Physician Assistants and Nurse Practitioners) who all work together to provide you with the care you need, when you need it.  We recommend signing up for the patient portal called "MyChart".  Sign up information is provided on this After Visit Summary.  MyChart is used to connect with patients for Virtual Visits (Telemedicine).  Patients are able to view lab/test results, encounter notes, upcoming appointments, etc.  Non-urgent messages can be sent to your provider as well.   To learn more about what you can do with MyChart, go to [email or web address].    Your next appointment:   4 week(s)  The format for your next appointment:   In Person  Provider:   You may see [name], MD or one of the following Advanced Practice Providers on your designated Care Team:   Murray Hodgkins,  NP Christell Faith, PA-C Cadence Kathlen Mody, [place]   Other Instructions  [address] [place], [place] [address] Dept: [phone] Loc: [place]  04/29/2021  You are scheduled for a Peripheral Angiogram on Wednesday, January 18 with Dr. [name].  1. Please arrive at the [facility] (Main Entrance A) at [facility]: [address] [place], [place] [address] at 6:30 AM (This time is two hours before your procedure to ensure your preparation). Free valet parking service is available.   Special note: Every effort is made to have your procedure done on time. Please understand that emergencies sometimes delay scheduled procedures.  2. Diet: Do not eat solid foods after midnight.  The patient may have clear liquids until 5am upon the day of the procedure.  3. Labs: You will need to have blood drawn next week at our office. You do not need to be fasting.  4. Medication instructions in preparation for your procedure:   Contrast Allergy: No    Stop taking Coumadin (Warfarin) on Sunday, January 15.  Stop taking, Lasix (Furosemide)  Wednesday, January 18,      On the morning of your procedure, take your Aspirin and any morning medicines NOT listed above.  You may use sips of water.  5. Plan for one night stay--bring personal belongings. 6. Bring a current list of your medications and current insurance cards. 7. You MUST have a responsible person to drive you home.  8. Someone MUST be with you the first 24 hours after you arrive home or your discharge will be delayed. 9. Please wear clothes that are easy to get on and off and wear slip-on shoes.  Thank you for allowing [place] to care for you!   -- [place] [facility]

## 2021-04-29 NOTE — H&P (View-Only) (Signed)
Cardiology Office Note   Date:  04/29/2021   ID:  [name], DOB May 15, 1940, MRN [id number]  PCP:  Owens Loffler, MD  Cardiologist:   [name], MD   Chief Complaint  Patient presents with   Other    1 month f/u c/o sob. Meds reviewed verbally with pt.      History of Present Illness: [name] is a 81 y.o. male who presents for a followup visit regarding extensive peripheral arterial disease with previous stenting of the left subclavian artery and left axillary artery, bilateral common iliac artery stenting and  left renal artery stenting. The patient has multiple medical problems including hyperlipidemia, Small AAA, long smoking history for 60 years, COPD , moderate carotid disease, and atherosclerotic coronary artery disease status post CABG in March of 2013 with LIMA to the LAD, vein graft to the PDA, vein graft to the OM. He does have mild aortic valve stenosis.  He had a small intracranial hemorrhage in 2014 which was suspected to be due to malignant hypertension and dual antiplatelet therapy. Shortly after that he was diagnosed with extensive right-sided DVT. Anticoagulation was contraindicated and thus an IVC filter was placed.   He had extensive bilateral occlusive DVT in 2018 which required catheter-based therapy.  He has been on anticoagulation since then.   He was seen recently for significant worsening of exertional dyspnea and bilateral leg claudication.  This correlated with the development of generalized rash that started in September.  The rash is in the upper extremities, the chest and abdomen area as well as the lower extremities.  It improved with systemic prednisone but then had significant worsening again when he finished prednisone course. Due to severe exertional fatigue and dyspnea, he underwent a recent right and left cardiac catheterization which showed severe native vessel disease with occluded left main and mid right coronary artery.  All his  grafts were patent and normal.  Right heart catheterization showed moderately elevated wedge pressure, mild pulmonary hypertension and mildly reduced cardiac output.  Aortic stenosis was noted to be moderate to severe by echo but it was moderate by cath with mean gradient of 15 mmHg and valve area of 1.1 cm.  Based on these results, I asked him to resume furosemide at 20 mg every other day.  Due to worsening claudication, he underwent repeated Doppler studies which showed an ABI of 0.61 on the right and 0.56 on the left.  His previous ABI was 0.74 bilaterally.  His iliac stents were not well visualized. He continues to be extremely limited by lower extremity claudication happening with short distance walking.  He has no rest pain or lower extremity ulceration.  He feels that claudication has significantly affected his quality of life.  Past Medical History:  Diagnosis Date   ([place]) heart failure with improved ejection fraction ([place])    a. 06/2011 Echo: EF 35-40%; b. 06/2012 Echo: EF 50%; c. 12/2016 Echo: EF 55-60%, no rwma, GRI DD, mild AS/MR, nl RV fxn, nl RVSP; d. 02/2021 Echo: EF 55%, GrII DD. Mild MR. Mild to mod TR. Sev AS by VTI.   AAA (abdominal aortic aneurysm)    a. 05/2020 Abd u/s: 3.6cm.   Aortic calcification ([facility]) 05/01/2013   Aortic stenosis    a. 02/2021 Echo: EF 55%, GrII DD. Sev Ca2+ of AoV. Sev AS w/ AVA by VTI 0.79cm^2.   Bilateral renal artery stenosis ([place])    a. 06/2013 Angio/PTA: LRA: 95 (5x18 Herculink stent), RRA  60ost, celiac/SMA nl, RCIA patent stents extending into dist Ao, RIIA 70ost, REIA min irregs, LCIA patent stent, LIIA 60-70ost, LEIA min irregs.   Coronary artery disease    a. 2013 s/p CABG x 3 (LIMA->LAD, VG->OM, VG->RPDA; b. 06/2012 MV: no ischemia.   History of prior cigarette smoking 04/11/2008   Qualifier: Diagnosis of  By: Diona Browner MD, Amy     Hypertension    Hypothyroidism    Intraventricular hemorrhage ([place]) 07/12/2012   Ischemic cardiomyopathy    a.  06/2011 Echo: EF 35-40%; b. 06/2012 Echo: EF 50%; c. 12/2016 Echo: EF 55-60%, no rwma, GRI DD.   Peripheral arterial disease ([place])    a. Previous left lower extremity stenting by Dr. Jamal Collin;  b. 12/2012 s/p bilat ostial common iliac stenting; c. 05/2020 ABIs: R 0.74, L 0.73-->Stable. Patent bilat common/ext iliacs by duplex.   Right leg DVT ([place]) 08/13/2012   Subclavian artery stenosis, left ([place]) 05/2012   Status post stenting of the ostium and self-expanding stent placement to the left axillary artery   Venous insufficiency     Past Surgical History:  Procedure Laterality Date   ABDOMINAL ANGIOGRAM  07/10/2013   WITH BI-FEMORAL RUNOFF       DR Fletcher Anon   ABDOMINAL AORTAGRAM N/A 12/26/2012   Procedure: ABDOMINAL Maxcine Ham;  Surgeon: Wellington Hampshire, MD;  Location: [facility];  Service: Cardiovascular;  Laterality: N/A;   ABDOMINAL AORTAGRAM N/A 07/10/2013   Procedure: ABDOMINAL Maxcine Ham;  Surgeon: Wellington Hampshire, MD;  Location: [facility] CATH [facility];  Service: Cardiovascular;  Laterality: N/A;   ANGIOPLASTY / STENTING FEMORAL  05/2012   ANGIOPLASTY / STENTING ILIAC Bilateral 12/26/2012   ARCH AORTOGRAM N/A 06/20/2012   Procedure: ARCH AORTOGRAM;  Surgeon: Wellington Hampshire, MD;  Location: [facility];  Service: Cardiovascular;  Laterality: N/A;   CARDIAC CATHETERIZATION  2/14   [place]   CHOLECYSTECTOMY  1990's   CORONARY ARTERY BYPASS GRAFT  07/11/2011   Procedure: CORONARY ARTERY BYPASS GRAFTING (CABG);  Surgeon: Grace Isaac, MD;  Location: [facility];  Service: Open Heart Surgery;  Laterality: N/A;  Times 3. On Pump. Using right greater saphenous vein and left internal mammary artery.    HERNIA REPAIR     INSERTION OF ILIAC STENT Bilateral 12/26/2012   Procedure: INSERTION OF ILIAC STENT;  Surgeon: Wellington Hampshire, MD;  Location: [facility];  Service: Cardiovascular;  Laterality: Bilateral;  Bilateral Common Iliac Artery   LEFT AND RIGHT HEART CATHETERIZATION WITH CORONARY ANGIOGRAM  07/07/2011   Procedure:  LEFT AND RIGHT HEART CATHETERIZATION WITH CORONARY ANGIOGRAM;  Surgeon: Minna Merritts, MD;  Location: [facility] C[facility];  Service: Cardiovascular;;   LOWER EXTREMITY VENOGRAPHY Bilateral 12/30/2016   Procedure: Lower Extremity Venography;  Surgeon: Katha Cabal, MD;  Location: [facility];  Service: Cardiovascular;  Laterality: Bilateral;   PERCUTANEOUS STENT INTERVENTION  06/20/2012   Procedure: PERCUTANEOUS STENT INTERVENTION;  Surgeon: Wellington Hampshire, MD;  Location: [facility] CATH LAB;  Service: Cardiovascular;;   RENAL ARTERY ANGIOPLASTY Left 07/10/2013   DR Remingtyn Depaola   RIGHT/LEFT HEART CATH AND CORONARY/GRAFT ANGIOGRAPHY N/A 03/15/2021   Procedure: RIGHT/LEFT HEART CATH AND CORONARY/GRAFT ANGIOGRAPHY;  Surgeon: Wellington Hampshire, MD;  Location: [facility];  Service: Cardiovascular;  Laterality: N/A;   SUBCLAVIAN ARTERY STENT  05/2012   "2 stents" (12/26/2012)   TOOTH EXTRACTION  Spring 2017   mulitple (6)   VENA CAVA FILTER PLACEMENT  07/2012   Removable  Current Outpatient Medications  Medication Sig Dispense Refill   amLODipine (NORVASC) 5 MG tablet TAKE 1 TABLET BY MOUTH EVERY DAY 90 tablet 3   atorvastatin (LIPITOR) 20 MG tablet TAKE 1 TABLET BY MOUTH EVERY DAY 90 tablet 0   budesonide-formoterol (SYMBICORT) 160-4.5 MCG/ACT inhaler Inhale 2 puffs into the lungs 2 (two) times daily. 1 each 3   carvedilol (COREG) 6.25 MG tablet TAKE 1 TABLET BY MOUTH TWICE A DAY WITH MEALS 180 tablet 3   furosemide (LASIX) 20 MG tablet Take 1 tablet (20 mg total) by mouth every other day. 30 tablet 6   levothyroxine (SYNTHROID) 150 MCG tablet TAKE ONE TABLET BY MOUTH EVERY DAY BEFORE BREAKFAST 90 tablet 3   triamcinolone cream (KENALOG) 0.1 % Apply 1 application topically 2 (two) times daily. For rash 453.6 g 1   warfarin (COUMADIN) 5 MG tablet Take 0.5-1 tablets (2.5-5 mg total) by mouth See admin instructions. TAKE 1/2 TABLET DAILY BY MOUTH EXCEPT TAKE 1 TABLET ON MON, WED, FRI OR AS  DIRECTED ANTICOAGULATION CLINIC 105 tablet 1   predniSONE (DELTASONE) 20 MG tablet 2 tabs po daily for 5 days, then 1 tab po daily for 5 days (Patient not taking: Reported on 04/29/2021) 15 tablet 0   No current facility-administered medications for this visit.    Allergies:   Plavix [clopidogrel bisulfate]    Social History:  The patient  reports that he quit smoking about 10 years ago. His smoking use included cigarettes. He has a 50.00 pack-year smoking history. He has never used smokeless tobacco. He reports that he does not drink alcohol and does not use drugs.   Family History:  The patient's family history includes Alcohol abuse in his father; Cirrhosis in his father; Hypothyroidism in his sister.    ROS:  Please see the history of present illness.   Otherwise, review of systems are positive for none.   All other systems are reviewed and negative.    PHYSICAL EXAM: VS:  BP 140/62 (BP Location: Left Arm, Patient Position: Sitting, Cuff Size: Normal)    Pulse 73    Ht 5\' 9"  (1.753 m)    Wt 178 lb 6 oz (80.9 kg)    SpO2 98%    BMI 26.34 kg/m  , BMI Body mass index is 26.34 kg/m. GEN: Well nourished, well developed, in no acute distress  HEENT: normal  Neck: no JVD or masses. There is left carotid bruit and a bruit in the left subclavian artery area. Cardiac: RRR; no , rubs, or gallops,no edema . 2/6 systolic ejection murmur in the aortic area Respiratory:  clear to auscultation bilaterally, normal work of breathing GI: soft, nontender, nondistended, + BS MS: no deformity or atrophy  Skin: warm and dry, no rash Neuro:  Strength and sensation are intact Psych: euthymic mood, full affect Vascular: Femoral pulse: Barely palpable on both sides.  Distal pulses are not palpable.  EKG:  EKG is not ordered today.    Recent Labs: 04/30/2020: TSH 1.88 02/12/2021: B Natriuretic Peptide 632.7 03/24/2021: ALT 22; BUN 18; Creatinine, Ser 1.39; Hemoglobin 14.8; Platelets 250.0; Potassium 5.0;  Sodium 138    Lipid Panel    Component Value Date/Time   CHOL 131 04/30/2020 1157   CHOL 125 01/31/2014 0756   TRIG 92.0 04/30/2020 1157   HDL 61.90 04/30/2020 1157   HDL 63 01/31/2014 0756   CHOLHDL 2 04/30/2020 1157   VLDL 18.4 04/30/2020 1157   LDLCALC 50 04/30/2020 1157   LDLCALC  46 01/31/2014 0756      Wt Readings from Last 3 Encounters:  04/29/21 178 lb 6 oz (80.9 kg)  03/24/21 179 lb 6 oz (81.4 kg)  03/23/21 176 lb 6 oz (80 kg)        ASSESSMENT AND PLAN:   1. Peripheral arterial disease with previous iliac stenting.  He now has severe bilateral calf claudication which is equal on both sides.  He feels extremely limited by his symptoms.  No improvement with medical therapy.  Doppler studies showed decreased ABI to 0.5 range.  Suspect significant progression of his SFA/popliteal disease.  Also, his femoral pulses are very diminished and most likely he has significant inflow disease.  Given severity of his symptoms, recommend proceeding with abdominal aortogram with lower extremity runoff and possible endovascular intervention.  I discussed the procedure in details as well as risks and benefits.  Planned access is via the right common femoral artery. Start aspirin 81 mg once daily.  Hold warfarin 3 days before the procedure.  2. Coronary artery disease involving native coronary arteries without angina: Recent cardiac catheterization showed patent grafts.  Continue medical therapy.  3.  Bilateral renal artery stenosis: Status post left renal artery stenting.  Most recent duplex did not show clear evidence of disease bilaterally although the left side was not well visualized.  4. Abdominal aortic aneurysm: This was small on most recent ultrasound .  5 . Essential hypertension: Blood pressure is reasonably controlled on current medications.  6. Bilateral carotid artery stenosis: Carotid Doppler last year showed moderate left carotid stenosis.  I requested a follow-up  study.  7.  Status post left subclavian artery and left axillary artery stenting.  Stents were patent on recent cardiac cath angiography.  8.  History of previous extensive lower extremity DVT.  Currently on lifelong anticoagulation with warfarin .  His DVT was in 2018.  Thus, I do not think he requires bridging with Lovenox at the present time.  9.  Hyperlipidemia: Continue treatment with atorvastatin.  I reviewed most recent lipid profile which showed an LDL of 50.  10.  Chronic diastolic heart failure: There was evidence of volume overload on right heart catheterization recently.  Since then, I resumed small dose furosemide.      Disposition:   Proceed with an abdominal aortogram with lower extremity angiography.  Signed,  [name], MD  04/29/2021 2:00 PM    [place]

## 2021-04-29 NOTE — Progress Notes (Signed)
Cardiology Office Note   Date:  04/29/2021   ID:  [name], DOB 06/28/1940, MRN [id number]  PCP:  Owens Loffler, MD  Cardiologist:   [name], MD   Chief Complaint  Patient presents with   Other    1 month f/u c/o sob. Meds reviewed verbally with pt.      History of Present Illness: [name] is a 81 y.o. male who presents for a followup visit regarding extensive peripheral arterial disease with previous stenting of the left subclavian artery and left axillary artery, bilateral common iliac artery stenting and  left renal artery stenting. The patient has multiple medical problems including hyperlipidemia, Small AAA, long smoking history for 60 years, COPD , moderate carotid disease, and atherosclerotic coronary artery disease status post CABG in March of 2013 with LIMA to the LAD, vein graft to the PDA, vein graft to the OM. He does have mild aortic valve stenosis.  He had a small intracranial hemorrhage in 2014 which was suspected to be due to malignant hypertension and dual antiplatelet therapy. Shortly after that he was diagnosed with extensive right-sided DVT. Anticoagulation was contraindicated and thus an IVC filter was placed.   He had extensive bilateral occlusive DVT in 2018 which required catheter-based therapy.  He has been on anticoagulation since then.   He was seen recently for significant worsening of exertional dyspnea and bilateral leg claudication.  This correlated with the development of generalized rash that started in September.  The rash is in the upper extremities, the chest and abdomen area as well as the lower extremities.  It improved with systemic prednisone but then had significant worsening again when he finished prednisone course. Due to severe exertional fatigue and dyspnea, he underwent a recent right and left cardiac catheterization which showed severe native vessel disease with occluded left main and mid right coronary artery.  All his  grafts were patent and normal.  Right heart catheterization showed moderately elevated wedge pressure, mild pulmonary hypertension and mildly reduced cardiac output.  Aortic stenosis was noted to be moderate to severe by echo but it was moderate by cath with mean gradient of 15 mmHg and valve area of 1.1 cm.  Based on these results, I asked him to resume furosemide at 20 mg every other day.  Due to worsening claudication, he underwent repeated Doppler studies which showed an ABI of 0.61 on the right and 0.56 on the left.  His previous ABI was 0.74 bilaterally.  His iliac stents were not well visualized. He continues to be extremely limited by lower extremity claudication happening with short distance walking.  He has no rest pain or lower extremity ulceration.  He feels that claudication has significantly affected his quality of life.  Past Medical History:  Diagnosis Date   ([place]) heart failure with improved ejection fraction ([place])    a. 06/2011 Echo: EF 35-40%; b. 06/2012 Echo: EF 50%; c. 12/2016 Echo: EF 55-60%, no rwma, GRI DD, mild AS/MR, nl RV fxn, nl RVSP; d. 02/2021 Echo: EF 55%, GrII DD. Mild MR. Mild to mod TR. Sev AS by VTI.   AAA (abdominal aortic aneurysm)    a. 05/2020 Abd u/s: 3.6cm.   Aortic calcification ([facility]) 05/01/2013   Aortic stenosis    a. 02/2021 Echo: EF 55%, GrII DD. Sev Ca2+ of AoV. Sev AS w/ AVA by VTI 0.79cm^2.   Bilateral renal artery stenosis ([place])    a. 06/2013 Angio/PTA: LRA: 95 (5x18 Herculink stent), RRA  60ost, celiac/SMA nl, RCIA patent stents extending into dist Ao, RIIA 70ost, REIA min irregs, LCIA patent stent, LIIA 60-70ost, LEIA min irregs.   Coronary artery disease    a. 2013 s/p CABG x 3 (LIMA->LAD, VG->OM, VG->RPDA; b. 06/2012 MV: no ischemia.   History of prior cigarette smoking 04/11/2008   Qualifier: Diagnosis of  By: Diona Browner MD, Amy     Hypertension    Hypothyroidism    Intraventricular hemorrhage ([place]) 07/12/2012   Ischemic cardiomyopathy    a.  06/2011 Echo: EF 35-40%; b. 06/2012 Echo: EF 50%; c. 12/2016 Echo: EF 55-60%, no rwma, GRI DD.   Peripheral arterial disease ([place])    a. Previous left lower extremity stenting by Dr. Jamal Collin;  b. 12/2012 s/p bilat ostial common iliac stenting; c. 05/2020 ABIs: R 0.74, L 0.73-->Stable. Patent bilat common/ext iliacs by duplex.   Right leg DVT ([place]) 08/13/2012   Subclavian artery stenosis, left ([place]) 05/2012   Status post stenting of the ostium and self-expanding stent placement to the left axillary artery   Venous insufficiency     Past Surgical History:  Procedure Laterality Date   ABDOMINAL ANGIOGRAM  07/10/2013   WITH BI-FEMORAL RUNOFF       DR Fletcher Anon   ABDOMINAL AORTAGRAM N/A 12/26/2012   Procedure: ABDOMINAL Maxcine Ham;  Surgeon: Wellington Hampshire, MD;  Location: [facility] CATH LAB;  Service: Cardiovascular;  Laterality: N/A;   ABDOMINAL AORTAGRAM N/A 07/10/2013   Procedure: ABDOMINAL Maxcine Ham;  Surgeon: Wellington Hampshire, MD;  Location: [facility] CATH LAB;  Service: Cardiovascular;  Laterality: N/A;   ANGIOPLASTY / STENTING FEMORAL  05/2012   ANGIOPLASTY / STENTING ILIAC Bilateral 12/26/2012   ARCH AORTOGRAM N/A 06/20/2012   Procedure: ARCH AORTOGRAM;  Surgeon: Wellington Hampshire, MD;  Location: [facility] CATH LAB;  Service: Cardiovascular;  Laterality: N/A;   CARDIAC CATHETERIZATION  2/14   [place]   CHOLECYSTECTOMY  1990's   CORONARY ARTERY BYPASS GRAFT  07/11/2011   Procedure: CORONARY ARTERY BYPASS GRAFTING (CABG);  Surgeon: Grace Isaac, MD;  Location: [facility];  Service: Open Heart Surgery;  Laterality: N/A;  Times 3. On Pump. Using right greater saphenous vein and left internal mammary artery.    HERNIA REPAIR     INSERTION OF ILIAC STENT Bilateral 12/26/2012   Procedure: INSERTION OF ILIAC STENT;  Surgeon: Wellington Hampshire, MD;  Location: [facility] CATH LAB;  Service: Cardiovascular;  Laterality: Bilateral;  Bilateral Common Iliac Artery   LEFT AND RIGHT HEART CATHETERIZATION WITH CORONARY ANGIOGRAM  07/07/2011   Procedure:  LEFT AND RIGHT HEART CATHETERIZATION WITH CORONARY ANGIOGRAM;  Surgeon: Minna Merritts, MD;  Location: [facility] CATH LAB;  Service: Cardiovascular;;   LOWER EXTREMITY VENOGRAPHY Bilateral 12/30/2016   Procedure: Lower Extremity Venography;  Surgeon: Katha Cabal, MD;  Location: [facility] CV LAB;  Service: Cardiovascular;  Laterality: Bilateral;   PERCUTANEOUS STENT INTERVENTION  06/20/2012   Procedure: PERCUTANEOUS STENT INTERVENTION;  Surgeon: Wellington Hampshire, MD;  Location: [facility] CATH LAB;  Service: Cardiovascular;;   RENAL ARTERY ANGIOPLASTY Left 07/10/2013   DR Birgitta Uhlir   RIGHT/LEFT HEART CATH AND CORONARY/GRAFT ANGIOGRAPHY N/A 03/15/2021   Procedure: RIGHT/LEFT HEART CATH AND CORONARY/GRAFT ANGIOGRAPHY;  Surgeon: Wellington Hampshire, MD;  Location: [facility];  Service: Cardiovascular;  Laterality: N/A;   SUBCLAVIAN ARTERY STENT  05/2012   "2 stents" (12/26/2012)   TOOTH EXTRACTION  Spring 2017   mulitple (6)   VENA CAVA FILTER PLACEMENT  07/2012   Removable  Current Outpatient Medications  Medication Sig Dispense Refill   amLODipine (NORVASC) 5 MG tablet TAKE 1 TABLET BY MOUTH EVERY DAY 90 tablet 3   atorvastatin (LIPITOR) 20 MG tablet TAKE 1 TABLET BY MOUTH EVERY DAY 90 tablet 0   budesonide-formoterol (SYMBICORT) 160-4.5 MCG/ACT inhaler Inhale 2 puffs into the lungs 2 (two) times daily. 1 each 3   carvedilol (COREG) 6.25 MG tablet TAKE 1 TABLET BY MOUTH TWICE A DAY WITH MEALS 180 tablet 3   furosemide (LASIX) 20 MG tablet Take 1 tablet (20 mg total) by mouth every other day. 30 tablet 6   levothyroxine (SYNTHROID) 150 MCG tablet TAKE ONE TABLET BY MOUTH EVERY DAY BEFORE BREAKFAST 90 tablet 3   triamcinolone cream (KENALOG) 0.1 % Apply 1 application topically 2 (two) times daily. For rash 453.6 g 1   warfarin (COUMADIN) 5 MG tablet Take 0.5-1 tablets (2.5-5 mg total) by mouth See admin instructions. TAKE 1/2 TABLET DAILY BY MOUTH EXCEPT TAKE 1 TABLET ON MON, WED, FRI OR AS  DIRECTED ANTICOAGULATION CLINIC 105 tablet 1   predniSONE (DELTASONE) 20 MG tablet 2 tabs po daily for 5 days, then 1 tab po daily for 5 days (Patient not taking: Reported on 04/29/2021) 15 tablet 0   No current facility-administered medications for this visit.    Allergies:   Plavix [clopidogrel bisulfate]    Social History:  The patient  reports that he quit smoking about 10 years ago. His smoking use included cigarettes. He has a 50.00 pack-year smoking history. He has never used smokeless tobacco. He reports that he does not drink alcohol and does not use drugs.   Family History:  The patient's family history includes Alcohol abuse in his father; Cirrhosis in his father; Hypothyroidism in his sister.    ROS:  Please see the history of present illness.   Otherwise, review of systems are positive for none.   All other systems are reviewed and negative.    PHYSICAL EXAM: VS:  BP 140/62 (BP Location: Left Arm, Patient Position: Sitting, Cuff Size: Normal)    Pulse 73    Ht 5\' 9"  (1.753 m)    Wt 178 lb 6 oz (80.9 kg)    SpO2 98%    BMI 26.34 kg/m  , BMI Body mass index is 26.34 kg/m. GEN: Well nourished, well developed, in no acute distress  HEENT: normal  Neck: no JVD or masses. There is left carotid bruit and a bruit in the left subclavian artery area. Cardiac: RRR; no , rubs, or gallops,no edema . 2/6 systolic ejection murmur in the aortic area Respiratory:  clear to auscultation bilaterally, normal work of breathing GI: soft, nontender, nondistended, + BS MS: no deformity or atrophy  Skin: warm and dry, no rash Neuro:  Strength and sensation are intact Psych: euthymic mood, full affect Vascular: Femoral pulse: Barely palpable on both sides.  Distal pulses are not palpable.  EKG:  EKG is not ordered today.    Recent Labs: 04/30/2020: TSH 1.88 02/12/2021: B Natriuretic Peptide 632.7 03/24/2021: ALT 22; BUN 18; Creatinine, Ser 1.39; Hemoglobin 14.8; Platelets 250.0; Potassium 5.0;  Sodium 138    Lipid Panel    Component Value Date/Time   CHOL 131 04/30/2020 1157   CHOL 125 01/31/2014 0756   TRIG 92.0 04/30/2020 1157   HDL 61.90 04/30/2020 1157   HDL 63 01/31/2014 0756   CHOLHDL 2 04/30/2020 1157   VLDL 18.4 04/30/2020 1157   LDLCALC 50 04/30/2020 1157   LDLCALC  46 01/31/2014 0756      Wt Readings from Last 3 Encounters:  04/29/21 178 lb 6 oz (80.9 kg)  03/24/21 179 lb 6 oz (81.4 kg)  03/23/21 176 lb 6 oz (80 kg)        ASSESSMENT AND PLAN:   1. Peripheral arterial disease with previous iliac stenting.  He now has severe bilateral calf claudication which is equal on both sides.  He feels extremely limited by his symptoms.  No improvement with medical therapy.  Doppler studies showed decreased ABI to 0.5 range.  Suspect significant progression of his SFA/popliteal disease.  Also, his femoral pulses are very diminished and most likely he has significant inflow disease.  Given severity of his symptoms, recommend proceeding with abdominal aortogram with lower extremity runoff and possible endovascular intervention.  I discussed the procedure in details as well as risks and benefits.  Planned access is via the right common femoral artery. Start aspirin 81 mg once daily.  Hold warfarin 3 days before the procedure.  2. Coronary artery disease involving native coronary arteries without angina: Recent cardiac catheterization showed patent grafts.  Continue medical therapy.  3.  Bilateral renal artery stenosis: Status post left renal artery stenting.  Most recent duplex did not show clear evidence of disease bilaterally although the left side was not well visualized.  4. Abdominal aortic aneurysm: This was small on most recent ultrasound .  5 . Essential hypertension: Blood pressure is reasonably controlled on current medications.  6. Bilateral carotid artery stenosis: Carotid Doppler last year showed moderate left carotid stenosis.  I requested a follow-up  study.  7.  Status post left subclavian artery and left axillary artery stenting.  Stents were patent on recent cardiac cath angiography.  8.  History of previous extensive lower extremity DVT.  Currently on lifelong anticoagulation with warfarin .  His DVT was in 2018.  Thus, I do not think he requires bridging with Lovenox at the present time.  9.  Hyperlipidemia: Continue treatment with atorvastatin.  I reviewed most recent lipid profile which showed an LDL of 50.  10.  Chronic diastolic heart failure: There was evidence of volume overload on right heart catheterization recently.  Since then, I resumed small dose furosemide.      Disposition:   Proceed with an abdominal aortogram with lower extremity angiography.  Signed,  [name], MD  04/29/2021 2:00 PM    [place]

## 2021-04-29 NOTE — Telephone Encounter (Signed)
Patient has an appt today with Dr. Fletcher Anon

## 2021-04-30 ENCOUNTER — Telehealth: Payer: Self-pay

## 2021-04-30 NOTE — Telephone Encounter (Signed)
Patient's wife called to reschedule appointment due to up coming surgery. Left message on voicemail to return my call.

## 2021-05-06 ENCOUNTER — Other Ambulatory Visit: Payer: Self-pay

## 2021-05-06 ENCOUNTER — Other Ambulatory Visit (INDEPENDENT_AMBULATORY_CARE_PROVIDER_SITE_OTHER): Payer: Medicare Other

## 2021-05-06 ENCOUNTER — Ambulatory Visit: Payer: Medicare Other | Admitting: Dermatology

## 2021-05-06 DIAGNOSIS — I2510 Atherosclerotic heart disease of native coronary artery without angina pectoris: Secondary | ICD-10-CM | POA: Diagnosis not present

## 2021-05-06 DIAGNOSIS — I739 Peripheral vascular disease, unspecified: Secondary | ICD-10-CM | POA: Diagnosis not present

## 2021-05-06 DIAGNOSIS — I10 Essential (primary) hypertension: Secondary | ICD-10-CM | POA: Diagnosis not present

## 2021-05-07 ENCOUNTER — Ambulatory Visit (INDEPENDENT_AMBULATORY_CARE_PROVIDER_SITE_OTHER): Payer: Medicare Other

## 2021-05-07 DIAGNOSIS — Z7901 Long term (current) use of anticoagulants: Secondary | ICD-10-CM

## 2021-05-07 LAB — CBC WITH DIFFERENTIAL/PLATELET
Basophils Absolute: 0.1 10*3/uL (ref 0.0–0.2)
Basos: 1 %
EOS (ABSOLUTE): 0.3 10*3/uL (ref 0.0–0.4)
Eos: 4 %
Hematocrit: 42.6 % (ref 37.5–51.0)
Hemoglobin: 14.4 g/dL (ref 13.0–17.7)
Immature Grans (Abs): 0 10*3/uL (ref 0.0–0.1)
Immature Granulocytes: 0 %
Lymphocytes Absolute: 1.8 10*3/uL (ref 0.7–3.1)
Lymphs: 28 %
MCH: 29.4 pg (ref 26.6–33.0)
MCHC: 33.8 g/dL (ref 31.5–35.7)
MCV: 87 fL (ref 79–97)
Monocytes Absolute: 0.8 10*3/uL (ref 0.1–0.9)
Monocytes: 12 %
Neutrophils Absolute: 3.7 10*3/uL (ref 1.4–7.0)
Neutrophils: 55 %
Platelets: 199 10*3/uL (ref 150–450)
RBC: 4.9 x10E6/uL (ref 4.14–5.80)
RDW: 13.6 % (ref 11.6–15.4)
WBC: 6.7 10*3/uL (ref 3.4–10.8)

## 2021-05-07 LAB — BASIC METABOLIC PANEL
BUN/Creatinine Ratio: 17 (ref 10–24)
BUN: 22 mg/dL (ref 8–27)
CO2: 23 mmol/L (ref 20–29)
Calcium: 8.9 mg/dL (ref 8.6–10.2)
Chloride: 105 mmol/L (ref 96–106)
Creatinine, Ser: 1.27 mg/dL (ref 0.76–1.27)
Glucose: 89 mg/dL (ref 70–99)
Potassium: 4.5 mmol/L (ref 3.5–5.2)
Sodium: 142 mmol/L (ref 134–144)
eGFR: 57 mL/min/{1.73_m2} — ABNORMAL LOW (ref >59)

## 2021-05-07 LAB — POCT INR: INR: 2.8 (ref 2.0–3.0)

## 2021-05-07 NOTE — Progress Notes (Signed)
Continue 1/2 tablet (2.5 mg) daily except take 1 tablet (5 mg) on Mon, Wed and Fri. Recheck in 6 weeks.

## 2021-05-07 NOTE — Patient Instructions (Addendum)
Pre visit review using our clinic review tool, if applicable. No additional management support is needed unless otherwise documented below in the visit note.  Continue 1/2 tablet (2.5 mg) daily except take 1 tablet (5 mg) on Mon, Wed and Fri. Recheck in 6 weeks.

## 2021-05-11 ENCOUNTER — Telehealth: Payer: Self-pay | Admitting: *Deleted

## 2021-05-11 NOTE — Telephone Encounter (Signed)
Abdominal aortogram scheduled at [facility] for: Wednesday May 12, 2021 8:30 AM [facility] Main Entrance A [facility]) at: 6:30 AM   Diet-no solid food after midnight prior to cath, clear liquids until 5 AM day of procedure.  Medication instructions for procedure: -Hold:  Coumadin-per instructions -none 05/09/21 until post procedure  Lasix-AM of procedure -Except hold medication usual morning medications can be taken pre-cath with sips of water including aspirin 81 mg.    Confirmed patient has responsible adult to drive home post procedure and be with patient first 24 hours after arriving home.  [facility] does allow one visitor to accompany you and wait in the hospital waiting room while you are there for your procedure. You and your visitor will be asked to wear a mask once you enter the hospital.   Patient reports does not currently have any new symptoms concerning for COVID-19 and no household members with COVID-19 like illness.     Reviewed procedure/mask/visitor instructions with patient.

## 2021-05-12 ENCOUNTER — Other Ambulatory Visit: Payer: Self-pay

## 2021-05-12 ENCOUNTER — Encounter (HOSPITAL_COMMUNITY)
Admission: RE | Disposition: A | Discharge status: Home / Self Care | Payer: Medicare Other | Source: Home / Self Care | Attending: Cardiovascular Disease

## 2021-05-12 ENCOUNTER — Encounter (HOSPITAL_COMMUNITY): Payer: Self-pay | Admitting: Cardiovascular Disease

## 2021-05-12 ENCOUNTER — Ambulatory Visit (HOSPITAL_COMMUNITY)
Admission: RE | Admit: 2021-05-12 | Discharge: 2021-05-12 | Disposition: A | Discharge status: Home / Self Care | Payer: Medicare Other | Attending: Cardiovascular Disease | Admitting: Cardiovascular Disease

## 2021-05-12 DIAGNOSIS — Z951 Presence of aortocoronary bypass graft: Secondary | ICD-10-CM | POA: Diagnosis not present

## 2021-05-12 DIAGNOSIS — Z79899 Other long term (current) drug therapy: Secondary | ICD-10-CM | POA: Insufficient documentation

## 2021-05-12 DIAGNOSIS — I6523 Occlusion and stenosis of bilateral carotid arteries: Secondary | ICD-10-CM | POA: Insufficient documentation

## 2021-05-12 DIAGNOSIS — I110 Hypertensive heart disease with heart failure: Secondary | ICD-10-CM | POA: Diagnosis not present

## 2021-05-12 DIAGNOSIS — I5032 Chronic diastolic (congestive) heart failure: Secondary | ICD-10-CM | POA: Diagnosis not present

## 2021-05-12 DIAGNOSIS — Z86718 Personal history of other venous thrombosis and embolism: Secondary | ICD-10-CM | POA: Diagnosis not present

## 2021-05-12 DIAGNOSIS — I739 Peripheral vascular disease, unspecified: Secondary | ICD-10-CM

## 2021-05-12 DIAGNOSIS — I70213 Atherosclerosis of native arteries of extremities with intermittent claudication, bilateral legs: Secondary | ICD-10-CM | POA: Insufficient documentation

## 2021-05-12 DIAGNOSIS — I701 Atherosclerosis of renal artery: Secondary | ICD-10-CM | POA: Diagnosis not present

## 2021-05-12 DIAGNOSIS — I2510 Atherosclerotic heart disease of native coronary artery without angina pectoris: Secondary | ICD-10-CM | POA: Insufficient documentation

## 2021-05-12 DIAGNOSIS — Z87891 Personal history of nicotine dependence: Secondary | ICD-10-CM | POA: Insufficient documentation

## 2021-05-12 DIAGNOSIS — I350 Nonrheumatic aortic (valve) stenosis: Secondary | ICD-10-CM | POA: Insufficient documentation

## 2021-05-12 DIAGNOSIS — Z7901 Long term (current) use of anticoagulants: Secondary | ICD-10-CM | POA: Diagnosis not present

## 2021-05-12 DIAGNOSIS — J449 Chronic obstructive pulmonary disease, unspecified: Secondary | ICD-10-CM | POA: Insufficient documentation

## 2021-05-12 DIAGNOSIS — E785 Hyperlipidemia, unspecified: Secondary | ICD-10-CM | POA: Insufficient documentation

## 2021-05-12 DIAGNOSIS — I7143 Infrarenal abdominal aortic aneurysm, without rupture: Secondary | ICD-10-CM | POA: Diagnosis not present

## 2021-05-12 HISTORY — PX: ABDOMINAL AORTOGRAM W/LOWER EXTREMITY: CATH118223

## 2021-05-12 LAB — PROTIME-INR
INR: 1.6 — ABNORMAL HIGH (ref 0.8–1.2)
Prothrombin Time: 18.6 seconds — ABNORMAL HIGH (ref 11.4–15.2)

## 2021-05-12 SURGERY — ABDOMINAL AORTOGRAM W/LOWER EXTREMITY
Anesthesia: LOCAL

## 2021-05-12 MED ORDER — SODIUM CHLORIDE 0.9% FLUSH: 3.0000 mL | Freq: Two times a day (BID) | INTRAVENOUS | Status: DC

## 2021-05-12 MED ORDER — FUROSEMIDE 10 MG/ML IJ SOLN
INTRAMUSCULAR | Status: DC | PRN
  Administered 2021-05-12: 40 mg via INTRAVENOUS

## 2021-05-12 MED ORDER — SODIUM CHLORIDE 0.9% FLUSH: 3.0000 mL | INTRAVENOUS | Status: DC | PRN

## 2021-05-12 MED ORDER — ASPIRIN 81 MG PO CHEW: 81.0000 mg | CHEWABLE_TABLET | ORAL | Status: DC

## 2021-05-12 MED ORDER — LIDOCAINE HCL (PF) 1 % IJ SOLN
INTRAMUSCULAR | Status: DC | PRN
  Administered 2021-05-12: 12 mL via INTRADERMAL

## 2021-05-12 MED ORDER — ACETAMINOPHEN 325 MG PO TABS: 650.0000 mg | ORAL_TABLET | ORAL | Status: DC | PRN

## 2021-05-12 MED ORDER — SODIUM CHLORIDE 0.9 % WEIGHT BASED INFUSION: 1.0000 mL/kg/h | INTRAVENOUS | Status: DC

## 2021-05-12 MED ORDER — IODIXANOL 320 MG/ML IV SOLN
INTRAVENOUS | Status: DC | PRN
  Administered 2021-05-12: 150 mL via INTRA_ARTERIAL

## 2021-05-12 MED ORDER — FUROSEMIDE 40 MG PO TABS: 40.0000 mg | ORAL_TABLET | Freq: Every day | ORAL | 6 refills | Status: DC

## 2021-05-12 MED ORDER — FUROSEMIDE 10 MG/ML IJ SOLN
INTRAMUSCULAR | Status: AC
  Filled 2021-05-12: qty 4

## 2021-05-12 MED ORDER — CILOSTAZOL 50 MG PO TABS
50.0000 mg | ORAL_TABLET | Freq: Two times a day (BID) | ORAL | 3 refills | Status: DC
Start: 2021-05-12 — End: 2021-08-03

## 2021-05-12 MED ORDER — NITROGLYCERIN 1 MG/10 ML FOR IR/CATH LAB
INTRA_ARTERIAL | Status: DC | PRN
  Administered 2021-05-12: 300 ug via INTRA_ARTERIAL

## 2021-05-12 MED ORDER — LIDOCAINE HCL (PF) 1 % IJ SOLN
INTRAMUSCULAR | Status: AC
  Filled 2021-05-12: qty 30

## 2021-05-12 MED ORDER — SODIUM CHLORIDE 0.9 % IV SOLN: INTRAVENOUS | Status: DC

## 2021-05-12 MED ORDER — ONDANSETRON HCL 4 MG/2ML IJ SOLN: 4.0000 mg | Freq: Four times a day (QID) | INTRAMUSCULAR | Status: DC | PRN

## 2021-05-12 MED ORDER — HEPARIN (PORCINE) IN NACL 1000-0.9 UT/500ML-% IV SOLN
INTRAVENOUS | Status: DC | PRN
  Administered 2021-05-12: 1000 mL

## 2021-05-12 MED ORDER — SODIUM CHLORIDE 0.9 % IV SOLN: 250.0000 mL | INTRAVENOUS | Status: DC | PRN

## 2021-05-12 MED ORDER — HEPARIN (PORCINE) IN NACL 1000-0.9 UT/500ML-% IV SOLN
INTRAVENOUS | Status: AC
  Filled 2021-05-12: qty 1000

## 2021-05-12 MED ORDER — NITROGLYCERIN 1 MG/10 ML FOR IR/CATH LAB
INTRA_ARTERIAL | Status: AC
  Filled 2021-05-12: qty 10

## 2021-05-12 MED ORDER — SODIUM CHLORIDE 0.9 % WEIGHT BASED INFUSION
3.0000 mL/kg/h | INTRAVENOUS | Status: AC
  Administered 2021-05-12: 3 mL/kg/h via INTRAVENOUS

## 2021-05-12 SURGICAL SUPPLY — 11 items
CATH ANGIO 5F PIGTAIL 65CM (CATHETERS) ×1 IMPLANT
CATH CROSS OVER TEMPO 5F (CATHETERS) ×1 IMPLANT
CATH STRAIGHT 5FR 65CM (CATHETERS) ×1 IMPLANT
DEVICE CLOSURE MYNXGRIP 5F (Vascular Products) ×1 IMPLANT
KIT MICROPUNCTURE NIT STIFF (SHEATH) ×1 IMPLANT
KIT PV (KITS) ×2 IMPLANT
SHEATH PINNACLE 5F 10CM (SHEATH) ×1 IMPLANT
SYR MEDRAD MARK 7 150ML (SYRINGE) ×2 IMPLANT
TRANSDUCER W/STOPCOCK (MISCELLANEOUS) ×2 IMPLANT
TRAY PV CATH (CUSTOM PROCEDURE TRAY) ×2 IMPLANT
WIRE HITORQ VERSACORE ST 145CM (WIRE) ×1 IMPLANT

## 2021-05-12 NOTE — Interval H&P Note (Signed)
History and Physical Interval Note:  05/12/2021 8:23 AM  [name]  has presented today for surgery, with the diagnosis of pad.  The various methods of treatment have been discussed with the patient and family. After consideration of risks, benefits and other options for treatment, the patient has consented to  Procedure(s): ABDOMINAL AORTOGRAM W/LOWER EXTREMITY (N/A) as a surgical intervention.  The patient's history has been reviewed, patient examined, no change in status, stable for surgery.  I have reviewed the patient's chart and labs.  Questions were answered to the patient's satisfaction.     [name]

## 2021-05-12 NOTE — Progress Notes (Signed)
Pt ambulated without difficulty or bleeding.   Discharged home with girlfriend who will drive and stay with pt x 24 hrs

## 2021-05-26 ENCOUNTER — Other Ambulatory Visit: Payer: Self-pay

## 2021-05-26 ENCOUNTER — Ambulatory Visit (INDEPENDENT_AMBULATORY_CARE_PROVIDER_SITE_OTHER): Payer: Medicare Other | Admitting: Dermatology

## 2021-05-26 DIAGNOSIS — L309 Dermatitis, unspecified: Secondary | ICD-10-CM | POA: Diagnosis not present

## 2021-05-26 DIAGNOSIS — R21 Rash and other nonspecific skin eruption: Secondary | ICD-10-CM

## 2021-05-26 DIAGNOSIS — L299 Pruritus, unspecified: Secondary | ICD-10-CM | POA: Diagnosis not present

## 2021-05-26 DIAGNOSIS — I701 Atherosclerosis of renal artery: Secondary | ICD-10-CM | POA: Diagnosis not present

## 2021-05-26 MED ORDER — EUCRISA 2 % EX OINT: 1.0000 "application " | TOPICAL_OINTMENT | Freq: Every day | CUTANEOUS | 1 refills | Status: DC

## 2021-05-26 NOTE — Patient Instructions (Addendum)
If You Need Anything After Your Visit ° °If you have any questions or concerns for your doctor, please call our main line at [phone] and press option 4 to reach your doctor's medical assistant. If no one answers, please leave a voicemail as directed and we will return your call as soon as possible. Messages left after 4 pm will be answered the following business day.  ° °You may also send us a message via MyChart. We typically respond to MyChart messages within 1-2 business days. ° °For prescription refills, please ask your pharmacy to contact our office. Our fax number is [phone]. ° °If you have an urgent issue when the clinic is closed that cannot wait until the next business day, you can page your doctor at the number below.   ° °Please note that while we do our best to be available for urgent issues outside of office hours, we are not available 24/7.  ° °If you have an urgent issue and are unable to reach us, you may choose to seek medical care at your doctor's office, retail clinic, urgent care center, or emergency room. ° °If you have a medical emergency, please immediately call 911 or go to the emergency department. ° °Pager Numbers ° °- Dr. Kowalski: [phone] ° °- Dr. Moye: [phone] ° °- Dr. Stewart: [phone] ° °In the event of inclement weather, please call our main line at [phone] for an update on the status of any delays or closures. ° °Dermatology Medication Tips: °Please keep the boxes that topical medications come in in order to help keep track of the instructions about where and how to use these. Pharmacies typically print the medication instructions only on the boxes and not directly on the medication tubes.  ° °If your medication is too expensive, please contact our office at [phone] option 4 or send us a message through MyChart.  ° °We are unable to tell what your co-pay for medications will be in advance as this is different depending on your insurance coverage.  However, we may be able to find a substitute medication at lower cost or fill out paperwork to get insurance to cover a needed medication.  ° °If a prior authorization is required to get your medication covered by your insurance company, please allow us 1-2 business days to complete this process. ° °Drug prices often vary depending on where the prescription is filled and some pharmacies may offer cheaper prices. ° °The website www.goodrx.com contains coupons for medications through different pharmacies. The prices here do not account for what the cost may be with help from insurance (it may be cheaper with your insurance), but the website can give you the price if you did not use any insurance.  °- You can print the associated coupon and take it with your prescription to the pharmacy.  °- You may also stop by our office during regular business hours and pick up a GoodRx coupon card.  °- If you need your prescription sent electronically to a different pharmacy, notify our office through [facility] MyChart or by phone at [phone] option 4. ° ° ° ° °Si Usted Necesita Algo Después de Su Visita ° °También puede enviarnos un mensaje a través de MyChart. Por lo general respondemos a los mensajes de MyChart en el transcurso de 1 a 2 días hábiles. ° °Para renovar recetas, por favor pida a su farmacia que se ponga en contacto con nuestra oficina. Nuestro número de fax es el [phone]. ° °Si tiene   un asunto urgente cuando la clínica esté cerrada y que no puede esperar hasta el siguiente día hábil, puede llamar/localizar a su doctor(a) al número que aparece a continuación.  ° °Por favor, tenga en cuenta que aunque hacemos todo lo posible para estar disponibles para asuntos urgentes fuera del horario de oficina, no estamos disponibles las 24 horas del día, los 7 días de la semana.  ° °Si tiene un problema urgente y no puede comunicarse con nosotros, puede optar por buscar atención médica  en el consultorio de su  doctor(a), en una clínica privada, en un centro de atención urgente o en una sala de emergencias. ° °Si tiene una emergencia médica, por favor llame inmediatamente al 911 o vaya a la sala de emergencias. ° °Números de bíper ° °- Dr. Kowalski: [phone] ° °- Dra. Moye: [phone] ° °- Dra. Stewart: [phone] ° °En caso de inclemencias del tiempo, por favor llame a nuestra línea principal al [phone] para una actualización sobre el estado de cualquier retraso o cierre. ° °Consejos para la medicación en dermatología: °Por favor, guarde las cajas en las que vienen los medicamentos de uso tópico para ayudarle a seguir las instrucciones sobre dónde y cómo usarlos. Las farmacias generalmente imprimen las instrucciones del medicamento sólo en las cajas y no directamente en los tubos del medicamento.  ° °Si su medicamento es muy caro, por favor, póngase en contacto con nuestra oficina llamando al [phone] y presione la opción 4 o envíenos un mensaje a través de MyChart.  ° °No podemos decirle cuál será su copago por los medicamentos por adelantado ya que esto es diferente dependiendo de la cobertura de su seguro. Sin embargo, es posible que podamos encontrar un medicamento sustituto a menor costo o llenar un formulario para que el seguro cubra el medicamento que se considera necesario.  ° °Si se requiere una autorización previa para que su compañía de seguros cubra su medicamento, por favor permítanos de 1 a 2 días hábiles para completar este proceso. ° °Los precios de los medicamentos varían con frecuencia dependiendo del lugar de dónde se surte la receta y alguna farmacias pueden ofrecer precios más baratos. ° °El sitio web www.goodrx.com tiene cupones para medicamentos de diferentes farmacias. Los precios aquí no tienen en cuenta lo que podría costar con la ayuda del seguro (puede ser más barato con su seguro), pero el sitio web puede darle el precio si no utilizó ningún seguro.  °- Puede imprimir el cupón  correspondiente y llevarlo con su receta a la farmacia.  °- También puede pasar por nuestra oficina durante el horario de atención regular y recoger una tarjeta de cupones de GoodRx.  °- Si necesita que su receta se envíe electrónicamente a una farmacia diferente, informe a nuestra oficina a través de MyChart de [place] o por teléfono llamando al [phone] y presione la opción 4. ° °Wound Care Instructions ° °Cleanse wound gently with soap and water once a day then pat dry with clean gauze. Apply a thing coat of Petrolatum (petroleum jelly, "Vaseline") over the wound (unless you have an allergy to this). We recommend that you use a new, sterile tube of Vaseline. Do not pick or remove scabs. Do not remove the yellow or white "healing tissue" from the base of the wound. ° °Cover the wound with fresh, clean, nonstick gauze and secure with paper tape. You may use Band-Aids in place of gauze and tape if the would is small enough, but would recommend trimming much of the   tape off as there is often too much. Sometimes Band-Aids can irritate the skin. ° °You should call the office for your biopsy report after 1 week if you have not already been contacted. ° °If you experience any problems, such as abnormal amounts of bleeding, swelling, significant bruising, significant pain, or evidence of infection, please call the office immediately. ° °FOR ADULT SURGERY PATIENTS: If you need something for pain relief you may take 1 extra strength Tylenol (acetaminophen) AND 2 Ibuprofen (200mg each) together every 4 hours as needed for pain. (do not take these if you are allergic to them or if you have a reason you should not take them.) Typically, you may only need pain medication for 1 to 3 days.  ° ° °

## 2021-05-26 NOTE — Progress Notes (Signed)
° °  Follow-Up Visit   Subjective  [name] is a 81 y.o. male who presents for the following: Eczema (Trunk, arms, legs, 74m f/u,  TMC 0.1% oint qd up to 5d/wk, cetaphil cr, lever 2000 soap).  Patient accompanied by wife who contributes to history.  The following portions of the chart were reviewed this encounter and updated as appropriate:   Tobacco   Allergies   Meds   Problems   Med Hx   Surg Hx   Fam Hx      Review of Systems:  No other skin or systemic complaints except as noted in HPI or Assessment and Plan.  Objective  Well appearing patient in no apparent distress; mood and affect are within normal limits.  A focused examination was performed including trunk, arms,. Relevant physical exam findings are noted in the Assessment and Plan.  L thigh Macular hyperpigmentation thighs, pink patchy excoriations legs, spotty hyperpigmentation trunk,               Assessment & Plan  Rash -chronic and persistent and pruritic and not improving with topical triamcinolone.  Most consistent with eczema/atopic dermatitis.  Rash is generalized. L thigh  Eczema vs other, bx today  Start Eucrisa oint qd to aa eczema Cont TMC 0.1% oint qd  Discussed may consider Dupixent if bx shows eczema  Topical steroids (such as triamcinolone, fluocinolone, fluocinonide, mometasone, clobetasol, halobetasol, betamethasone, hydrocortisone) can cause thinning and lightening of the skin if they are used for too long in the same area. Your physician has selected the right strength medicine for your problem and area affected on the body. Please use your medication only as directed by your physician to prevent side effects.    Skin / nail biopsy - L thigh Type of biopsy: punch   Informed consent: discussed and consent obtained   Timeout: patient name, date of birth, surgical site, and procedure verified   Procedure prep:  Patient was prepped and draped in usual sterile fashion (The patient was  cleaned and prepped) Prep type:  Isopropyl alcohol Anesthesia: the lesion was anesthetized in a standard fashion   Anesthetic:  1% lidocaine w/ epinephrine 1-100,000 buffered w/ 8.4% NaHCO3 Punch size:  3.5 mm Suture size:  4-0 Suture type: nylon   Hemostasis achieved with: suture, pressure and aluminum chloride   Outcome: patient tolerated procedure well   Post-procedure details: sterile dressing applied and wound care instructions given   Dressing type: bandage, pressure dressing and petrolatum    Crisaborole (EUCRISA) 2 % OINT - L thigh Apply 1 application topically daily. Qd to aa eczema on body  Specimen 1 - Surgical pathology Differential Diagnosis: D48.5 Eczema vs other  Check Margins: No Macular hyperpigmentation thighs, pink patchy excoriations legs, spotty hyperpigmentation trunk,  Return in about 1 week (around 06/02/2021) for bx f/u.  I, Othelia Pulling, RMA, am acting as scribe for Sarina Ser, MD . Documentation: I have reviewed the above documentation for accuracy and completeness, and I agree with the above.  Sarina Ser, MD

## 2021-05-27 ENCOUNTER — Telehealth: Payer: Self-pay | Admitting: Family Medicine

## 2021-05-27 ENCOUNTER — Encounter: Payer: Self-pay | Admitting: Dermatology

## 2021-05-27 ENCOUNTER — Telehealth: Payer: Self-pay

## 2021-05-27 ENCOUNTER — Other Ambulatory Visit: Payer: Self-pay | Admitting: Cardiovascular Disease

## 2021-05-27 NOTE — Telephone Encounter (Signed)
Patient's spouse called regarding Eucrisa Ointment. They have read a lot about this medication and very concerned about using this topical along with patient severe blood clots and vascular disease.

## 2021-05-27 NOTE — Telephone Encounter (Signed)
Please schedule Medicare Wellness with nurse and CPE with fasting labs prior with Dr. Copland. °

## 2021-05-27 NOTE — Telephone Encounter (Signed)
Spoke with spouse. She does apologize she was looking online and was searching a oral medication before she realized [place] was a ointment. They are going to start medication.

## 2021-05-28 NOTE — Telephone Encounter (Signed)
Called Mr. Jarred and got him scheduled for 3/13 for labs, and phone call and 3/20 for CPE.

## 2021-06-01 ENCOUNTER — Encounter: Payer: Self-pay | Admitting: Cardiovascular Disease

## 2021-06-01 ENCOUNTER — Ambulatory Visit (INDEPENDENT_AMBULATORY_CARE_PROVIDER_SITE_OTHER): Payer: Medicare Other | Admitting: Cardiovascular Disease

## 2021-06-01 ENCOUNTER — Other Ambulatory Visit: Payer: Self-pay

## 2021-06-01 VITALS — BP 130/64 | HR 83 | Ht 69.0 in | Wt 173.4 lb

## 2021-06-01 DIAGNOSIS — I714 Abdominal aortic aneurysm, without rupture, unspecified: Secondary | ICD-10-CM | POA: Diagnosis not present

## 2021-06-01 DIAGNOSIS — I6523 Occlusion and stenosis of bilateral carotid arteries: Secondary | ICD-10-CM

## 2021-06-01 DIAGNOSIS — I701 Atherosclerosis of renal artery: Secondary | ICD-10-CM

## 2021-06-01 DIAGNOSIS — I2510 Atherosclerotic heart disease of native coronary artery without angina pectoris: Secondary | ICD-10-CM

## 2021-06-01 DIAGNOSIS — I10 Essential (primary) hypertension: Secondary | ICD-10-CM | POA: Diagnosis not present

## 2021-06-01 DIAGNOSIS — E785 Hyperlipidemia, unspecified: Secondary | ICD-10-CM

## 2021-06-01 DIAGNOSIS — I739 Peripheral vascular disease, unspecified: Secondary | ICD-10-CM

## 2021-06-01 DIAGNOSIS — Z86718 Personal history of other venous thrombosis and embolism: Secondary | ICD-10-CM | POA: Diagnosis not present

## 2021-06-01 DIAGNOSIS — I5032 Chronic diastolic (congestive) heart failure: Secondary | ICD-10-CM

## 2021-06-01 NOTE — Patient Instructions (Signed)

## 2021-06-01 NOTE — Progress Notes (Signed)
Cardiology Office Note   Date:  06/01/2021   ID:  [name], DOB [birth year], MRN [id number]  PCP:  Owens Loffler, MD  Cardiologist:   [name], MD   Chief Complaint  Patient presents with   Other    4 wk f/u c/o fatigue, weakness and sob. Pt needing form for renewal of Handicap sticker .Meds reviewed verbally with pt.      History of Present Illness: [name] is a 81 y.o. male who presents for a followup visit regarding extensive peripheral arterial disease with previous stenting of the left subclavian artery and left axillary artery, bilateral common iliac artery stenting and  left renal artery stenting. The patient has multiple medical problems including hyperlipidemia, Small AAA, long smoking history for 60 years, COPD , moderate carotid disease, and atherosclerotic coronary artery disease status post CABG in March of 2013 with LIMA to the LAD, vein graft to the PDA, vein graft to the OM. He does have mild aortic valve stenosis.  He had a small intracranial hemorrhage in 2014 which was suspected to be due to malignant hypertension and dual antiplatelet therapy. Shortly after that he was diagnosed with extensive right-sided DVT. Anticoagulation was contraindicated and thus an IVC filter was placed.   He had extensive bilateral occlusive DVT in 2018 which required catheter-based therapy.  He has been on anticoagulation since then.   He was seen recently for significant worsening of exertional dyspnea and bilateral leg claudication.  This correlated with the development of generalized rash that started in September.  The rash is in the upper extremities, the chest and abdomen area as well as the lower extremities.  It improved with systemic prednisone but then had significant worsening again when he finished prednisone course. Due to severe exertional fatigue and dyspnea, he underwent a recent right and left cardiac catheterization which showed severe native vessel  disease with occluded left main and mid right coronary artery.  All his grafts were patent and normal.  Right heart catheterization showed moderately elevated wedge pressure, mild pulmonary hypertension and mildly reduced cardiac output.  Aortic stenosis was noted to be moderate to severe by echo but it was moderate by cath with mean gradient of 15 mmHg and valve area of 1.1 cm.  Based on these results, I asked him to resume furosemide at 20 mg every other day.  Due to worsening claudication, he underwent repeated Doppler studies which showed an ABI of 0.61 on the right and 0.56 on the left.  His previous ABI was 0.74 bilaterally.  His iliac stents were not well visualized. Angiography was performed on January 18 which showed patent left renal artery stent with moderate right renal artery stenosis, small abdominal aortic aneurysm, patent bilateral iliac kissing stents extending into the distal aorta with mild restenosis.  No other significant iliac disease.  On the right side, there was severe plaque in the distal common femoral artery extending into the ostium of the SFA with subtotal occlusion of the inferior branch of the profunda, diffuse calcified SFA disease with collaterals from the profunda and three-vessel runoff below the knee.  On the left side, there was diffuse calcified disease of the SFA with collaterals from the profunda, subtotal occlusion of the popliteal artery behind the knee and three-vessel runoff below the knee.  He was started on cilostazol and reports improvement in claudication. No chest pain.  He continues to be frustrated with generalized rash.  Past Medical History:  Diagnosis Date   (  HFimpEF) heart failure with improved ejection fraction ([name])    a. 06/2011 Echo: EF 35-40%; b. 06/2012 Echo: EF 50%; c. 12/2016 Echo: EF 55-60%, no rwma, GRI DD, mild AS/MR, nl RV fxn, nl RVSP; d. 02/2021 Echo: EF 55%, GrII DD. Mild MR. Mild to mod TR. Sev AS by VTI.   AAA (abdominal aortic  aneurysm)    a. 05/2020 Abd u/s: 3.6cm.   Aortic calcification (HCC) 05/01/2013   Aortic stenosis    a. 02/2021 Echo: EF 55%, GrII DD. Sev Ca2+ of AoV. Sev AS w/ AVA by VTI 0.79cm^2.   Bilateral renal artery stenosis ([name])    a. 06/2013 Angio/PTA: LRA: 95 (5x18 Herculink stent), RRA 60ost, celiac/SMA nl, RCIA patent stents extending into dist Ao, RIIA 70ost, REIA min irregs, LCIA patent stent, LIIA 60-70ost, LEIA min irregs.   Coronary artery disease    a. 2013 s/p CABG x 3 (LIMA->LAD, VG->OM, VG->RPDA; b. 06/2012 MV: no ischemia.   History of prior cigarette smoking 04/11/2008   Qualifier: Diagnosis of  By: Diona Browner MD, Amy     Hypertension    Hypothyroidism    Intraventricular hemorrhage ([name]) 07/12/2012   Ischemic cardiomyopathy    a. 06/2011 Echo: EF 35-40%; b. 06/2012 Echo: EF 50%; c. 12/2016 Echo: EF 55-60%, no rwma, GRI DD.   Peripheral arterial disease ([place])    a. Previous left lower extremity stenting by Dr. Jamal Collin;  b. 12/2012 s/p bilat ostial common iliac stenting; c. 05/2020 ABIs: R 0.74, L 0.73-->Stable. Patent bilat common/ext iliacs by duplex.   Right leg DVT ([name]) 08/13/2012   Subclavian artery stenosis, left ([name]) 05/2012   Status post stenting of the ostium and self-expanding stent placement to the left axillary artery   Venous insufficiency     Past Surgical History:  Procedure Laterality Date   ABDOMINAL ANGIOGRAM  07/10/2013   WITH BI-FEMORAL RUNOFF       DR Fletcher Anon   ABDOMINAL AORTAGRAM N/A 12/26/2012   Procedure: ABDOMINAL Maxcine Ham;  Surgeon: Wellington Hampshire, MD;  Location: [facility];  Service: Cardiovascular;  Laterality: N/A;   ABDOMINAL AORTAGRAM N/A 07/10/2013   Procedure: ABDOMINAL Maxcine Ham;  Surgeon: Wellington Hampshire, MD;  Location: [name];  Service: Cardiovascular;  Laterality: N/A;   ABDOMINAL AORTOGRAM W/LOWER EXTREMITY N/A 05/12/2021   Procedure: ABDOMINAL AORTOGRAM W/LOWER EXTREMITY;  Surgeon: Wellington Hampshire, MD;  Location: [name];  Service:  Cardiovascular;  Laterality: N/A;   ANGIOPLASTY / STENTING FEMORAL  05/2012   ANGIOPLASTY / STENTING ILIAC Bilateral 12/26/2012   ARCH AORTOGRAM N/A 06/20/2012   Procedure: ARCH AORTOGRAM;  Surgeon: Wellington Hampshire, MD;  Location: [facility];  Service: Cardiovascular;  Laterality: N/A;   CARDIAC CATHETERIZATION  2/14   [place]   CHOLECYSTECTOMY  1990's   CORONARY ARTERY BYPASS GRAFT  07/11/2011   Procedure: CORONARY ARTERY BYPASS GRAFTING (CABG);  Surgeon: Grace Isaac, MD;  Location: [facility];  Service: Open Heart Surgery;  Laterality: N/A;  Times 3. On Pump. Using right greater saphenous vein and left internal mammary artery.    HERNIA REPAIR     INSERTION OF ILIAC STENT Bilateral 12/26/2012   Procedure: INSERTION OF ILIAC STENT;  Surgeon: Wellington Hampshire, MD;  Location: [facility];  Service: Cardiovascular;  Laterality: Bilateral;  Bilateral Common Iliac Artery   LEFT AND RIGHT HEART CATHETERIZATION WITH CORONARY ANGIOGRAM  07/07/2011   Procedure: LEFT AND RIGHT HEART CATHETERIZATION WITH CORONARY ANGIOGRAM;  Surgeon: Minna Merritts, MD;  Location: [facility];  Service: Cardiovascular;;   LOWER EXTREMITY VENOGRAPHY Bilateral 12/30/2016   Procedure: Lower Extremity Venography;  Surgeon: Katha Cabal, MD;  Location: [facility];  Service: Cardiovascular;  Laterality: Bilateral;   PERCUTANEOUS STENT INTERVENTION  06/20/2012   Procedure: PERCUTANEOUS STENT INTERVENTION;  Surgeon: Wellington Hampshire, MD;  Location: [facility];  Service: Cardiovascular;;   RENAL ARTERY ANGIOPLASTY Left 07/10/2013   DR Analilia Geddis   RIGHT/LEFT HEART CATH AND CORONARY/GRAFT ANGIOGRAPHY N/A 03/15/2021   Procedure: RIGHT/LEFT HEART CATH AND CORONARY/GRAFT ANGIOGRAPHY;  Surgeon: Wellington Hampshire, MD;  Location: [facility];  Service: Cardiovascular;  Laterality: N/A;   SUBCLAVIAN ARTERY STENT  05/2012   "2 stents" (12/26/2012)   TOOTH EXTRACTION  Spring 2017   mulitple (6)   VENA CAVA FILTER PLACEMENT   07/2012   Removable     Current Outpatient Medications  Medication Sig Dispense Refill   amLODipine (NORVASC) 5 MG tablet TAKE 1 TABLET BY MOUTH EVERY DAY 90 tablet 3   atorvastatin (LIPITOR) 20 MG tablet TAKE 1 TABLET BY MOUTH EVERY DAY 90 tablet 0   carvedilol (COREG) 6.25 MG tablet TAKE 1 TABLET BY MOUTH TWICE A DAY WITH MEALS 180 tablet 0   cilostazol (PLETAL) 50 MG tablet Take 1 tablet (50 mg total) by mouth 2 (two) times daily. 60 tablet 3   Crisaborole (EUCRISA) 2 % OINT Apply 1 application topically daily. Qd to aa eczema on body 100 g 1   furosemide (LASIX) 40 MG tablet Take 1 tablet (40 mg total) by mouth daily. 30 tablet 6   levothyroxine (SYNTHROID) 150 MCG tablet TAKE ONE TABLET BY MOUTH EVERY DAY BEFORE BREAKFAST 90 tablet 0   warfarin (COUMADIN) 5 MG tablet Take 0.5-1 tablets (2.5-5 mg total) by mouth See admin instructions. TAKE 1/2 TABLET DAILY BY MOUTH EXCEPT TAKE 1 TABLET ON MON, WED, FRI OR AS DIRECTED ANTICOAGULATION CLINIC 105 tablet 1   budesonide-formoterol (SYMBICORT) 160-4.5 MCG/ACT inhaler Inhale 2 puffs into the lungs 2 (two) times daily. (Patient not taking: Reported on 05/04/2021) 1 each 3   triamcinolone cream (KENALOG) 0.1 % Apply 1 application topically 2 (two) times daily. For rash (Patient not taking: Reported on 06/01/2021) 453.6 g 1   No current facility-administered medications for this visit.    Allergies:   Plavix [clopidogrel bisulfate]    Social History:  The patient  reports that he quit smoking about 10 years ago. His smoking use included cigarettes. He has a 50.00 pack-year smoking history. He has never used smokeless tobacco. He reports that he does not drink alcohol and does not use drugs.   Family History:  The patient's family history includes Alcohol abuse in his father; Cirrhosis in his father; Hypothyroidism in his sister.    ROS:  Please see the history of present illness.   Otherwise, review of systems are positive for none.   All other  systems are reviewed and negative.    PHYSICAL EXAM: VS:  BP 130/64 (BP Location: Left Arm, Patient Position: Sitting, Cuff Size: Normal)    Pulse 83    Ht 5\' 9"  (1.753 m)    Wt 173 lb 6 oz (78.6 kg)    SpO2 98%    BMI 25.60 kg/m  , BMI Body mass index is 25.6 kg/m. GEN: Well nourished, well developed, in no acute distress  HEENT: normal  Neck: no JVD or masses. There is left carotid bruit and a bruit in the left  subclavian artery area. Cardiac: RRR; no , rubs, or gallops,no edema . 2/6 systolic ejection murmur in the aortic area Respiratory:  clear to auscultation bilaterally, normal work of breathing GI: soft, nontender, nondistended, + BS MS: no deformity or atrophy  Skin: warm and dry, no rash Neuro:  Strength and sensation are intact Psych: euthymic mood, full affect Vascular: Femoral pulse: Is mildly diminished bilaterally.  No groin hematoma.  EKG:  EKG is not ordered today.    Recent Labs: 02/12/2021: B Natriuretic Peptide 632.7 03/24/2021: ALT 22 05/06/2021: BUN 22; Creatinine, Ser 1.27; Hemoglobin 14.4; Platelets 199; Potassium 4.5; Sodium 142    Lipid Panel    Component Value Date/Time   CHOL 131 04/30/2020 1157   CHOL 125 01/31/2014 0756   TRIG 92.0 04/30/2020 1157   HDL 61.90 04/30/2020 1157   HDL 63 01/31/2014 0756   CHOLHDL 2 04/30/2020 1157   VLDL 18.4 04/30/2020 1157   LDLCALC 50 04/30/2020 1157   LDLCALC 46 01/31/2014 0756      Wt Readings from Last 3 Encounters:  06/01/21 173 lb 6 oz (78.6 kg)  05/12/21 174 lb (78.9 kg)  04/29/21 178 lb 6 oz (80.9 kg)        ASSESSMENT AND PLAN:   1. Peripheral arterial disease with previous iliac stenting.  He has significant bilateral calf claudication due to SFA/popliteal disease.  Revascularization options are not straightforward and requires endarterectomy on the right side.  I do not think he is a good surgical candidate.  I think it is best to maximize medical therapy and reserve revascularization for  progression of symptoms.  He reports some improvement in symptoms with cilostazol and has been tolerating the medication.  I discussed with him the importance of a walking exercise program and he is planning to start.    2. Coronary artery disease involving native coronary arteries without angina: Recent cardiac catheterization showed patent grafts.  Continue medical therapy.  3.  Bilateral renal artery stenosis: Status post left renal artery stenting.  Renal artery stent was patent on recent angiography.  4. Abdominal aortic aneurysm: This was small on most recent ultrasound .  5 . Essential hypertension: Blood pressure is reasonably controlled on current medications.  6. Bilateral carotid artery stenosis: The patient will require carotid Doppler within the next 6 months.  7.  Status post left subclavian artery and left axillary artery stenting.  Stents were patent on recent cardiac cath angiography.  8.  History of previous extensive lower extremity DVT.  Currently on lifelong anticoagulation with warfarin .    9.  Hyperlipidemia: Continue treatment with atorvastatin.  I reviewed most recent lipid profile which showed an LDL of 50.  10.  Chronic diastolic heart failure: There was evidence of volume overload on right heart catheterization recently.  Since then, I resumed small dose furosemide.      Disposition: Follow-up in 6 months.  Signed,  [name], MD  06/01/2021 2:07 PM    Trainer

## 2021-06-02 ENCOUNTER — Ambulatory Visit (INDEPENDENT_AMBULATORY_CARE_PROVIDER_SITE_OTHER): Payer: Medicare Other | Admitting: Dermatology

## 2021-06-02 DIAGNOSIS — L2089 Other atopic dermatitis: Secondary | ICD-10-CM | POA: Diagnosis not present

## 2021-06-02 DIAGNOSIS — I701 Atherosclerosis of renal artery: Secondary | ICD-10-CM

## 2021-06-02 NOTE — Patient Instructions (Signed)
Atopic dermatitis (eczema) is a chronic, relapsing, pruritic condition that can significantly affect quality of life. It is often associated with allergic rhinitis and/or asthma and can require treatment with topical medications, phototherapy, or in severe cases biologic injectable medication (Dupixent; Adbry) or Oral JAK inhibitors.   If You Need Anything After Your Visit  If you have any questions or concerns for your doctor, please call our main line at [phone] and press option 4 to reach your doctor's medical assistant. If no one answers, please leave a voicemail as directed and we will return your call as soon as possible. Messages left after 4 pm will be answered the following business day.   You may also send [place] a message via [place]. We typically respond to MyChart messages within 1-2 business days.  For prescription refills, please ask your pharmacy to contact our office. Our fax number is [phone].  If you have an urgent issue when the clinic is closed that cannot wait until the next business day, you can page your doctor at the number below.    Please note that while we do our best to be available for urgent issues outside of office hours, we are not available 24/7.   If you have an urgent issue and are unable to reach [place], you may choose to seek medical care at your doctor's office, retail clinic, urgent care center, or emergency room.  If you have a medical emergency, please immediately call 911 or go to the emergency department.  Pager Numbers  - Dr. Nehemiah Massed: [phone]  - Dr. Laurence Ferrari: [phone]  - Dr. Nicole Kindred: [phone]  In the event of inclement weather, please call our main line at [phone] for an update on the status of any delays or closures.  Dermatology Medication Tips: Please keep the boxes that topical medications come in in order to help keep track of the instructions about where and how to use these. Pharmacies typically print the medication  instructions only on the boxes and not directly on the medication tubes.   If your medication is too expensive, please contact our office at [phone] option 4 or send [place] a message through [place].   We are unable to tell what your co-pay for medications will be in advance as this is different depending on your insurance coverage. However, we may be able to find a substitute medication at lower cost or fill out paperwork to get insurance to cover a needed medication.   If a prior authorization is required to get your medication covered by your insurance company, please allow [place] 1-2 business days to complete this process.  Drug prices often vary depending on where the prescription is filled and some pharmacies may offer cheaper prices.  The website www.goodrx.com contains coupons for medications through different pharmacies. The prices here do not account for what the cost may be with help from insurance (it may be cheaper with your insurance), but the website can give you the price if you did not use any insurance.  - You can print the associated coupon and take it with your prescription to the pharmacy.  - You may also stop by our office during regular business hours and pick up a GoodRx coupon card.  - If you need your prescription sent electronically to a different pharmacy, notify our office through [facility] or by phone at [phone] option 4.     Si Usted Necesita Algo Despus de Su Visita  Tambin puede enviarnos un mensaje a Lawerance Cruel  de MyChart. Por lo general respondemos a los mensajes de MyChart en el transcurso de 1 a 2 das hbiles.  Para renovar recetas, por favor pida a su farmacia que se ponga en contacto con nuestra oficina. Harland Dingwall de fax es [place] [phone].  Si tiene un asunto urgente cuando la clnica est cerrada y que no puede esperar hasta el siguiente da hbil, puede llamar/localizar a su doctor(a) al nmero que aparece a continuacin.   Por  favor, tenga en cuenta que aunque hacemos todo lo posible para estar disponibles para asuntos urgentes fuera del horario de [place], no estamos disponibles las 24 horas del da, los 7 das de la [place].   Si tiene un problema urgente y no puede comunicarse con nosotros, puede optar por buscar atencin mdica  en el consultorio de su doctor(a), en una clnica privada, en un centro de atencin urgente o en una sala de emergencias.  Si tiene Engineering geologist, por favor llame inmediatamente al 911 o vaya a la sala de emergencias.  Nmeros de bper  - Dr. Nehemiah Massed: [phone]  - Dra. Moye: [phone]  - Dra. Nicole Kindred: [phone]  En caso de inclemencias del [place], por favor llame a Johnsie Kindred principal al [phone] para una actualizacin sobre el [place] de cualquier retraso o cierre.  Consejos para la medicacin en dermatologa: Por favor, guarde las cajas en las que vienen los medicamentos de uso tpico para ayudarle a seguir las instrucciones sobre dnde y cmo usarlos. Las farmacias generalmente imprimen las instrucciones del medicamento slo en las cajas y no directamente en los tubos del [place].   Si su medicamento es muy caro, por favor, pngase en contacto con Zigmund Daniel llamando al [phone] y presione la opcin 4 o envenos un mensaje a travs de Pharmacist, community.   No podemos decirle cul ser su copago por los medicamentos por adelantado ya que esto es diferente dependiendo de la cobertura de su seguro. Sin embargo, es posible que podamos encontrar un medicamento sustituto a Electrical engineer un formulario para que el seguro cubra el medicamento que se considera necesario.   Si se requiere una autorizacin previa para que su compaa de seguros [place] su medicamento, por favor permtanos de 1 a 2 das hbiles para completar este proceso.  Los precios de los medicamentos varan con frecuencia dependiendo del Environmental consultant de dnde se surte la receta y alguna farmacias  pueden ofrecer precios ms baratos.  El sitio web www.goodrx.com tiene cupones para medicamentos de Airline pilot. Los precios aqu no tienen en cuenta lo que podra costar con la ayuda del seguro (puede ser ms barato con su seguro), pero el sitio web puede darle el precio si no utiliz Research scientist (physical sciences).  - Puede imprimir el cupn correspondiente y llevarlo con su receta a la farmacia.  - Tambin puede pasar por nuestra oficina durante el horario de atencin regular y Charity fundraiser una tarjeta de cupones de GoodRx.  - Si necesita que su receta se enve electrnicamente a una farmacia diferente, informe a nuestra oficina a travs de MyChart de  o por telfono llamando al [phone] y presione la opcin 4.

## 2021-06-02 NOTE — Progress Notes (Signed)
° °  Follow-Up Visit   Subjective  [name] is a 81 y.o. male who presents for the following: Follow-up (Biopsy follow up - Eczema - not using Eucrisa (pharmacy did not fill yet), consider Dupixent).  Accompanied by wife  The following portions of the chart were reviewed this encounter and updated as appropriate:   Tobacco   Allergies   Meds   Problems   Med Hx   Surg Hx   Fam Hx      Review of Systems:  No other skin or systemic complaints except as noted in HPI or Assessment and Plan.  Objective  Well appearing patient in no apparent distress; mood and affect are within normal limits.  A focused examination was performed including trunk, extremities. Relevant physical exam findings are noted in the Assessment and Plan.  Macular hyperpigmentation thighs, pink patchy excoriations legs, spotty hyperpigmentation trunk   Assessment & Plan  Atopic dermatitis - severe and generalized.  Chronic and persistent condition with duration or expected duration over one year. Condition is bothersome/symptomatic for patient. Currently flared.  Failed topical steroid treatment. Complicated by multiple medical problems. Offered Dupixent injections - pt and wife declined today. Pt will fill topical Eucrisa Rx and start that today. Biopsy proven   Atopic dermatitis (eczema) is a chronic, relapsing, pruritic condition that can significantly affect quality of life. It is often associated with allergic rhinitis and/or asthma and can require treatment with topical medications, phototherapy, or in severe cases biologic injectable medication (Dupixent; Adbry) or Oral JAK inhibitors.   Discussed and recommend Dupixent. Discussed risk of conjunctivitis. Reviewed patient's medication list. Patient and his wife have reviewed the Dupixent information and they have decided that this is not a treatment that he wants to pursue at this time due to potential side effects. Extensive discussion today regarding his  treatment options. He has an appointment with Dr [name] for his annual physical and he will discuss Dupixent with him and get his opinion on starting.  Pt will fill Eucrisa Rx and start that today.  Discussed patch testing to rule out contact dermatitis. Advised patient pathology and distribution of rash is less consistent with contact dermatitis. May consider patch testing in the future.  Over 40 minutes spent in Counseling and discussion with pt and wife today.  Return today (on 06/02/2021), or if symptoms worsen or fail to improve.  I, Ashok Cordia, CMA, am acting as scribe for Sarina Ser, MD . Documentation: I have reviewed the above documentation for accuracy and completeness, and I agree with the above.  Sarina Ser, MD

## 2021-06-03 ENCOUNTER — Encounter: Payer: Self-pay | Admitting: Dermatology

## 2021-06-18 ENCOUNTER — Ambulatory Visit (INDEPENDENT_AMBULATORY_CARE_PROVIDER_SITE_OTHER): Payer: Medicare Other

## 2021-06-18 ENCOUNTER — Other Ambulatory Visit: Payer: Self-pay

## 2021-06-18 DIAGNOSIS — Z7901 Long term (current) use of anticoagulants: Secondary | ICD-10-CM | POA: Diagnosis not present

## 2021-06-18 LAB — POCT INR: INR: 2 (ref 2.0–3.0)

## 2021-06-18 NOTE — Progress Notes (Signed)
Continue 1/2 tablet (2.5 mg) daily except take 1 tablet (5 mg) on Mon, Wed and Fri. Recheck in 6 weeks.

## 2021-06-18 NOTE — Patient Instructions (Addendum)
Pre visit review using our clinic review tool, if applicable. No additional management support is needed unless otherwise documented below in the visit note.  Continue 1/2 tablet (2.5 mg) daily except take 1 tablet (5 mg) on Mon, Wed and Fri. Recheck in 6 weeks.

## 2021-06-28 ENCOUNTER — Other Ambulatory Visit: Payer: Self-pay | Admitting: Family Medicine

## 2021-06-28 DIAGNOSIS — E039 Hypothyroidism, unspecified: Secondary | ICD-10-CM

## 2021-06-28 DIAGNOSIS — R7309 Other abnormal glucose: Secondary | ICD-10-CM

## 2021-06-28 DIAGNOSIS — Z79899 Other long term (current) drug therapy: Secondary | ICD-10-CM

## 2021-06-28 DIAGNOSIS — E785 Hyperlipidemia, unspecified: Secondary | ICD-10-CM

## 2021-07-02 ENCOUNTER — Other Ambulatory Visit: Payer: Self-pay | Admitting: Family Medicine

## 2021-07-02 ENCOUNTER — Other Ambulatory Visit: Payer: Self-pay | Admitting: Cardiovascular Disease

## 2021-07-02 NOTE — Progress Notes (Signed)
Subjective:   [name] is a 81 y.o. male who presents for Medicare Annual/Subsequent preventive examination.  I connected with Karene Fry today by telephone and verified that I am speaking with the correct person using two identifiers. Location patient: home Location provider: work Persons participating in the virtual visit: patient, Marine scientist.    I discussed the limitations, risks, security and privacy concerns of performing an evaluation and management service by telephone and the availability of in person appointments. I also discussed with the patient that there may be a patient responsible charge related to this service. The patient expressed understanding and verbally consented to this telephonic visit.    Interactive audio and video telecommunications were attempted between this provider and patient, however failed, due to patient having technical difficulties OR patient did not have access to video capability.  We continued and completed visit with audio only.  Some vital signs may be absent or patient reported.   Time Spent with patient on telephone encounter: 20 minutes  Review of Systems     Cardiac Risk Factors include: advanced age (>20mn, >>52women);hypertension;dyslipidemia     Objective:    Today's Vitals   07/05/21 1356  Weight: 173 lb (78.5 kg)  Height: '5\' 9"'$  (1.753 m)   Body mass index is 25.55 kg/m.  Advanced Directives 07/05/2021 05/12/2021 03/15/2021 02/12/2021 01/09/2019 01/19/2017 12/29/2016  Does Patient Have a Medical Advance Directive? Yes Yes Yes Yes No No No  Type of AParamedicof [place] will [place] will Living will Living will - - -  Does patient want to make changes to medical advance directive? Yes (MAU/Ambulatory/Procedural Areas - Information given) No - Patient declined - No - Patient declined - - -  Copy of Healthcare Power of Attorney in Chart? - No - copy requested - - - - -   Would patient like information on creating a medical advance directive? - - - - No - Guardian declined - No - Patient declined  Pre-existing out of facility DNR order (yellow form or pink MOST form) - - - - - - -    Current Medications (verified) Outpatient Encounter Medications as of 07/05/2021  Medication Sig   amLODipine (NORVASC) 5 MG tablet TAKE 1 TABLET BY MOUTH EVERY DAY   atorvastatin (LIPITOR) 20 MG tablet TAKE 1 TABLET BY MOUTH EVERY DAY   carvedilol (COREG) 6.25 MG tablet TAKE 1 TABLET BY MOUTH TWICE A DAY WITH MEALS   cilostazol (PLETAL) 50 MG tablet Take 1 tablet (50 mg total) by mouth 2 (two) times daily.   Crisaborole (EUCRISA) 2 % OINT Apply 1 application topically daily. Qd to aa eczema on body   furosemide (LASIX) 40 MG tablet Take 1 tablet (40 mg total) by mouth daily.   levothyroxine (SYNTHROID) 150 MCG tablet TAKE ONE TABLET BY MOUTH EVERY DAY BEFORE BREAKFAST   warfarin (COUMADIN) 5 MG tablet Take 0.5-1 tablets (2.5-5 mg total) by mouth See admin instructions. TAKE 1/2 TABLET DAILY BY MOUTH EXCEPT TAKE 1 TABLET ON MON, WED, FRI OR AS DIRECTED ANTICOAGULATION CLINIC   budesonide-formoterol (SYMBICORT) 160-4.5 MCG/ACT inhaler Inhale 2 puffs into the lungs 2 (two) times daily. (Patient not taking: Reported on 05/04/2021)   triamcinolone cream (KENALOG) 0.1 % Apply 1 application topically 2 (two) times daily. For rash (Patient not taking: Reported on 06/01/2021)   triamcinolone ointment (KENALOG) 0.1 % PLEASE SEE ATTACHED FOR DETAILED DIRECTIONS (Patient not taking: Reported on 07/05/2021)   [DISCONTINUED] amLODipine (  NORVASC) 5 MG tablet TAKE 1 TABLET BY MOUTH EVERY DAY   [DISCONTINUED] Fluticasone-Salmeterol (ADVAIR DISKUS) 250-50 MCG/DOSE AEPB Inhale 1 puff into the lungs 2 (two) times daily.   No facility-administered encounter medications on file as of 07/05/2021.    Allergies (verified) Plavix [clopidogrel bisulfate]   History: Past Medical History:  Diagnosis Date    (HFimpEF) heart failure with improved ejection fraction ([place])    a. 06/2011 Echo: EF 35-40%; b. 06/2012 Echo: EF 50%; c. 12/2016 Echo: EF 55-60%, no rwma, GRI DD, mild AS/MR, nl RV fxn, nl RVSP; d. 02/2021 Echo: EF 55%, GrII DD. Mild MR. Mild to mod TR. Sev AS by VTI.   AAA (abdominal aortic aneurysm)    a. 05/2020 Abd u/s: 3.6cm.   Aortic calcification (HCC) 05/01/2013   Aortic stenosis    a. 02/2021 Echo: EF 55%, GrII DD. Sev Ca2+ of AoV. Sev AS w/ AVA by VTI 0.79cm^2.   Bilateral renal artery stenosis ([place])    a. 06/2013 Angio/PTA: LRA: 95 (5x18 Herculink stent), RRA 60ost, celiac/SMA nl, RCIA patent stents extending into dist Ao, RIIA 70ost, REIA min irregs, LCIA patent stent, LIIA 60-70ost, LEIA min irregs.   Coronary artery disease    a. 2013 s/p CABG x 3 (LIMA->LAD, VG->OM, VG->RPDA; b. 06/2012 MV: no ischemia.   History of prior cigarette smoking 04/11/2008   Qualifier: Diagnosis of  By: Diona Browner MD, Amy     Hypertension    Hypothyroidism    Intraventricular hemorrhage ([place]) 07/12/2012   Ischemic cardiomyopathy    a. 06/2011 Echo: EF 35-40%; b. 06/2012 Echo: EF 50%; c. 12/2016 Echo: EF 55-60%, no rwma, GRI DD.   Peripheral arterial disease ([place])    a. Previous left lower extremity stenting by Dr. Jamal Collin;  b. 12/2012 s/p bilat ostial common iliac stenting; c. 05/2020 ABIs: R 0.74, L 0.73-->Stable. Patent bilat common/ext iliacs by duplex.   Right leg DVT ([place]) 08/13/2012   Subclavian artery stenosis, left ([place]) 05/2012   Status post stenting of the ostium and self-expanding stent placement to the left axillary artery   Venous insufficiency    Past Surgical History:  Procedure Laterality Date   ABDOMINAL ANGIOGRAM  07/10/2013   WITH BI-FEMORAL RUNOFF       DR Fletcher Anon   ABDOMINAL AORTAGRAM N/A 12/26/2012   Procedure: ABDOMINAL Maxcine Ham;  Surgeon: Wellington Hampshire, MD;  Location: [facility] [place] CATH LAB;  Service: Cardiovascular;  Laterality: N/A;   ABDOMINAL AORTAGRAM N/A 07/10/2013   Procedure: ABDOMINAL  Maxcine Ham;  Surgeon: Wellington Hampshire, MD;  Location: [place] CATH LAB;  Service: Cardiovascular;  Laterality: N/A;   ABDOMINAL AORTOGRAM W/LOWER EXTREMITY N/A 05/12/2021   Procedure: ABDOMINAL AORTOGRAM W/LOWER EXTREMITY;  Surgeon: Wellington Hampshire, MD;  Location: [facility] CV LAB;  Service: Cardiovascular;  Laterality: N/A;   ANGIOPLASTY / STENTING FEMORAL  05/2012   ANGIOPLASTY / STENTING ILIAC Bilateral 12/26/2012   ARCH AORTOGRAM N/A 06/20/2012   Procedure: ARCH AORTOGRAM;  Surgeon: Wellington Hampshire, MD;  Location: [facility] CATH LAB;  Service: Cardiovascular;  Laterality: N/A;   CARDIAC CATHETERIZATION  2/14   [place]   CHOLECYSTECTOMY  1990's   CORONARY ARTERY BYPASS GRAFT  07/11/2011   Procedure: CORONARY ARTERY BYPASS GRAFTING (CABG);  Surgeon: Grace Isaac, MD;  Location: [facility];  Service: Open Heart Surgery;  Laterality: N/A;  Times 3. On Pump. Using right greater saphenous vein and left internal mammary artery.    HERNIA REPAIR     INSERTION OF ILIAC STENT Bilateral  12/26/2012   Procedure: INSERTION OF ILIAC STENT;  Surgeon: Wellington Hampshire, MD;  Location: [facility] CATH LAB;  Service: Cardiovascular;  Laterality: Bilateral;  Bilateral Common Iliac Artery   LEFT AND RIGHT HEART CATHETERIZATION WITH CORONARY ANGIOGRAM  07/07/2011   Procedure: LEFT AND RIGHT HEART CATHETERIZATION WITH CORONARY ANGIOGRAM;  Surgeon: Minna Merritts, MD;  Location: [facility] CATH LAB;  Service: Cardiovascular;;   LOWER EXTREMITY VENOGRAPHY Bilateral 12/30/2016   Procedure: Lower Extremity Venography;  Surgeon: Katha Cabal, MD;  Location: [facility] CV LAB;  Service: Cardiovascular;  Laterality: Bilateral;   PERCUTANEOUS STENT INTERVENTION  06/20/2012   Procedure: PERCUTANEOUS STENT INTERVENTION;  Surgeon: Wellington Hampshire, MD;  Location: [facility] CATH LAB;  Service: Cardiovascular;;   RENAL ARTERY ANGIOPLASTY Left 07/10/2013   DR ARIDA   RIGHT/LEFT HEART CATH AND CORONARY/GRAFT ANGIOGRAPHY N/A 03/15/2021   Procedure: RIGHT/LEFT  HEART CATH AND CORONARY/GRAFT ANGIOGRAPHY;  Surgeon: Wellington Hampshire, MD;  Location: [place] LAB;  Service: Cardiovascular;  Laterality: N/A;   SUBCLAVIAN ARTERY STENT  05/2012   "2 stents" (12/26/2012)   TOOTH EXTRACTION  Spring 2017   mulitple (6)   VENA CAVA FILTER PLACEMENT  07/2012   Removable   Family History  Problem Relation Age of Onset   Alcohol abuse Father    Cirrhosis Father    Hypothyroidism Sister    Social History   Socioeconomic History   Marital status: Married    Spouse name: Not on file   Number of children: 2   Years of education: Not on file   Highest education level: Not on file  Occupational History   Occupation: Retired    Fish farm manager: RETIRED    Comment: Land  Tobacco Use   Smoking status: Former    Packs/day: 1.00    Years: 50.00    Pack years: 50.00    Types: Cigarettes    Quit date: 12/25/2010    Years since quitting: 10.5   Smokeless tobacco: Never  Vaping Use   Vaping Use: Never used  Substance and Sexual Activity   Alcohol use: No   Drug use: No   Sexual activity: Not Currently  Other Topics Concern   Not on file  Social History Narrative   Exercising 3 times a week.    Moderate diet control.    O living will, no HCPOA.   Social Determinants of Health   Financial Resource Strain: Low Risk    Difficulty of Paying Living Expenses: Not very hard  Food Insecurity: No Food Insecurity   Worried About Charity fundraiser in the Last Year: Never true   Ran Out of Food in the Last Year: Never true  Transportation Needs: No Transportation Needs   Lack of Transportation (Medical): No   Lack of Transportation (Non-Medical): No  Physical Activity: Insufficiently Active   Days of Exercise per Week: 7 days   Minutes of Exercise per Session: 20 min  Stress: No Stress Concern Present   Feeling of Stress : Not at all  Social Connections: Moderately Isolated   Frequency of Communication with Friends and Family: More than  three times a week   Frequency of Social Gatherings with Friends and Family: More than three times a week   Attends Religious Services: Never   Marine scientist or Organizations: No   Attends Archivist Meetings: Never   Marital Status: Married    Tobacco Counseling Counseling given: Not Answered   Clinical Intake:  Pre-visit  preparation completed: Yes  Pain : No/denies pain     BMI - recorded: 25.55 Nutritional Status: BMI 25 -29 Overweight Nutritional Risks: None Diabetes: No  How often do you need to have someone help you when you read instructions, pamphlets, or other written materials from your doctor or pharmacy?: 1 - Never  Diabetic? No  Interpreter Needed?: No  Information entered by :: Orrin Brigham LPN   Activities of Daily Living In your present state of health, do you have any difficulty performing the following activities: 07/05/2021 03/15/2021  Hearing? N N  Vision? N N  Difficulty concentrating or making decisions? N N  Walking or climbing stairs? N N  Dressing or bathing? N N  Doing errands, shopping? N -  Preparing Food and eating ? N -  Using the Toilet? N -  In the past six months, have you accidently leaked urine? N -  Do you have problems with loss of bowel control? N -  Managing your Medications? N -  Managing your Finances? N -  Housekeeping or managing your Housekeeping? N -  Some recent data might be hidden    Patient Care Team: Owens Loffler, MD as PCP - General (Family Medicine) Wellington Hampshire, MD as PCP - Cardiology (Cardiology) Minna Merritts, MD as Consulting Physician (Cardiology) Owens Loffler, MD (Family Medicine)  Indicate any recent Medical Services you may have received from other than [facility] providers in the past year (date may be approximate).     Assessment:   This is a routine wellness examination for Mayra.  Hearing/Vision screen Hearing Screening - Comments:: No issues  Vision  Screening - Comments:: Last exam 2022  Dietary issues and exercise activities discussed: Current Exercise Habits: Home exercise routine, Type of exercise: walking, Time (Minutes): 30, Frequency (Times/Week): 7, Weekly Exercise (Minutes/Week): 210, Intensity: Moderate   Goals Addressed             This Visit's Progress    Patient Stated       Would like to have the ability to be more active        Depression Screen PHQ 2/9 Scores 07/05/2021 05/07/2020 01/09/2019 10/02/2017 09/08/2016 08/06/2014  PHQ - 2 Score 0 0 0 0 0 0  PHQ- 9 Score - - 0 - - -    Fall Risk Fall Risk  07/05/2021 05/07/2020 01/09/2019 11/27/2017 10/02/2017  Falls in the past year? 0 0 0 No No  Comment - - - Emmi Telephone Survey: data to providers prior to load -  Number falls in past yr: 0 - 0 - -  Injury with Fall? 0 - - - -  Risk for fall due to : No Fall Risks - Medication side effect - -  Follow up Falls prevention discussed - Falls evaluation completed;Falls prevention discussed - -    FALL RISK PREVENTION PERTAINING TO THE HOME:  Any stairs in or around the home? Yes  If so, are there any without handrails? No  Home free of loose throw rugs in walkways, pet beds, electrical cords, etc? Yes  Adequate lighting in your home to reduce risk of falls? Yes   ASSISTIVE DEVICES UTILIZED TO PREVENT FALLS:  Life alert? No  Use of a cane, walker or w/c? No  Grab bars in the bathroom? Yes  Shower chair or bench in shower? No  Elevated toilet seat or a handicapped toilet? No   TIMED UP AND GO:  Was the test performed? No .  Cognitive Function: Normal cognitive status assessed by this Nurse Health Advisor. No abnormalities found.   MMSE - Mini Mental State Exam 01/09/2019 09/08/2016  Orientation to time 5 5  Orientation to Place 5 5  Registration 3 3  Attention/ Calculation 5 0  Recall 2 3  Language- name 2 objects - 0  Language- repeat 1 1  Language- follow 3 step command - 3  Language- read & follow  direction - 0  Write a sentence - 0  Copy design - 0  Total score - 20        Immunizations Immunization History  Administered Date(s) Administered   PFIZER(Purple Top)SARS-COV-2 Vaccination 05/01/2019, 05/22/2019, 02/10/2020   Pneumococcal Conjugate-13 02/20/2014   Pneumococcal Polysaccharide-23 04/11/2008   Td 09/07/2006    TDAP status: Due, Education has been provided regarding the importance of this vaccine. Advised may receive this vaccine at local pharmacy or Health Dept. Aware to provide a copy of the vaccination record if obtained from local pharmacy or Health Dept. Verbalized acceptance and understanding.  Flu Vaccine status: Declined, Education has been provided regarding the importance of this vaccine but patient still declined. Advised may receive this vaccine at local pharmacy or Health Dept. Aware to provide a copy of the vaccination record if obtained from local pharmacy or Health Dept. Verbalized acceptance and understanding.  Pneumococcal vaccine status: Up to date  Covid-19 vaccine status: Information provided on how to obtain vaccines.   Qualifies for Shingles Vaccine? Yes   Zostavax completed No   Shingrix Completed?: No.    Education has been provided regarding the importance of this vaccine. Patient has been advised to call insurance company to determine out of pocket expense if they have not yet received this vaccine. Advised may also receive vaccine at local pharmacy or Health Dept. Verbalized acceptance and understanding.  Screening Tests Health Maintenance  Topic Date Due   Zoster Vaccines- Shingrix (1 of 2) Never done   COVID-19 Vaccine (4 - Booster for Pfizer series) 04/06/2020   INFLUENZA VACCINE  07/23/2021 (Originally 11/23/2020)   TETANUS/TDAP  09/06/2026 (Originally 09/06/2016)   Pneumonia Vaccine 81+ Years old  Completed   HPV VACCINES  Aged Out    Health Maintenance  Health Maintenance Due  Topic Date Due   Zoster Vaccines- Shingrix (1 of 2)  Never done   COVID-19 Vaccine (4 - Booster for Pfizer series) 04/06/2020    Colorectal cancer screening: No longer required.   Lung Cancer Screening: (Low Dose CT Chest recommended if Age 49-80 years, 30 pack-year currently smoking OR have quit w/in 15years.) does qualify, DG chest completed 02/12/21.     Additional Screening:  Hepatitis C Screening: does not qualify  Vision Screening: Recommended annual ophthalmology exams for early detection of glaucoma and other disorders of the eye. Is the patient up to date with their annual eye exam?  Yes  Who is the provider or what is the name of the office in which the patient attends annual eye exams? [facility]    Dental Screening: Recommended annual dental exams for proper oral hygiene  Community Resource Referral / Chronic Care Management: CRR required this visit?  No   CCM required this visit?  No      Plan:     I have personally reviewed and noted the following in the patients chart:   Medical and social history Use of alcohol, tobacco or illicit drugs  Current medications and supplements including opioid prescriptions. Patient is not currently taking opioid prescriptions.  Functional ability and status Nutritional status Physical activity Advanced directives List of other physicians Hospitalizations, surgeries, and ER visits in previous 12 months Vitals Screenings to include cognitive, depression, and falls Referrals and appointments  In addition, I have reviewed and discussed with patient certain preventive protocols, quality metrics, and best practice recommendations. A written personalized care plan for preventive services as well as general preventive health recommendations were provided to patient.   Due to this being a telephonic visit, the after visit summary with patients personalized plan was offered to patient via mail or my-chart.Patient would like to access on my-chart.   Loma Messing,  LPN   [id number]   Nurse Health Advisor  Nurse Notes: none

## 2021-07-05 ENCOUNTER — Other Ambulatory Visit (INDEPENDENT_AMBULATORY_CARE_PROVIDER_SITE_OTHER): Payer: Medicare Other

## 2021-07-05 ENCOUNTER — Ambulatory Visit (INDEPENDENT_AMBULATORY_CARE_PROVIDER_SITE_OTHER): Payer: Medicare Other

## 2021-07-05 ENCOUNTER — Other Ambulatory Visit: Payer: Self-pay

## 2021-07-05 VITALS — Ht 69.0 in | Wt 173.0 lb

## 2021-07-05 DIAGNOSIS — Z0000 Encounter for general adult medical examination without abnormal findings: Secondary | ICD-10-CM | POA: Diagnosis not present

## 2021-07-05 DIAGNOSIS — E785 Hyperlipidemia, unspecified: Secondary | ICD-10-CM | POA: Diagnosis not present

## 2021-07-05 DIAGNOSIS — R7309 Other abnormal glucose: Secondary | ICD-10-CM

## 2021-07-05 DIAGNOSIS — Z79899 Other long term (current) drug therapy: Secondary | ICD-10-CM | POA: Diagnosis not present

## 2021-07-05 DIAGNOSIS — E039 Hypothyroidism, unspecified: Secondary | ICD-10-CM | POA: Diagnosis not present

## 2021-07-05 LAB — BASIC METABOLIC PANEL
BUN: 24 mg/dL — ABNORMAL HIGH (ref 6–23)
CO2: 29 mEq/L (ref 19–32)
Calcium: 9.2 mg/dL (ref 8.4–10.5)
Chloride: 102 mEq/L (ref 96–112)
Creatinine, Ser: 1.47 mg/dL (ref 0.40–1.50)
GFR: 44.9 mL/min — ABNORMAL LOW (ref >60.00)
Glucose, Bld: 78 mg/dL (ref 70–99)
Potassium: 4 mEq/L (ref 3.5–5.1)
Sodium: 139 mEq/L (ref 135–145)

## 2021-07-05 LAB — LIPID PANEL
Cholesterol: 121 mg/dL (ref 0–200)
HDL: 59.4 mg/dL (ref >39.00)
LDL Cholesterol: 39 mg/dL (ref 0–99)
NonHDL: 62.07
Total CHOL/HDL Ratio: 2
Triglycerides: 115 mg/dL (ref 0.0–149.0)
VLDL: 23 mg/dL (ref 0.0–40.0)

## 2021-07-05 LAB — T4, FREE: Free T4: 1.72 ng/dL — ABNORMAL HIGH (ref 0.60–1.60)

## 2021-07-05 LAB — CBC WITH DIFFERENTIAL/PLATELET
Basophils Absolute: 0 10*3/uL (ref 0.0–0.1)
Basophils Relative: 0.7 % (ref 0.0–3.0)
Eosinophils Absolute: 0.3 10*3/uL (ref 0.0–0.7)
Eosinophils Relative: 4.5 % (ref 0.0–5.0)
HCT: 41.3 % (ref 39.0–52.0)
Hemoglobin: 13.5 g/dL (ref 13.0–17.0)
Lymphocytes Relative: 23.7 % (ref 12.0–46.0)
Lymphs Abs: 1.7 10*3/uL (ref 0.7–4.0)
MCHC: 32.7 g/dL (ref 30.0–36.0)
MCV: 88.4 fl (ref 78.0–100.0)
Monocytes Absolute: 0.8 10*3/uL (ref 0.1–1.0)
Monocytes Relative: 11.2 % (ref 3.0–12.0)
Neutro Abs: 4.4 10*3/uL (ref 1.4–7.7)
Neutrophils Relative %: 59.9 % (ref 43.0–77.0)
Platelets: 219 10*3/uL (ref 150.0–400.0)
RBC: 4.68 Mil/uL (ref 4.22–5.81)
RDW: 15.6 % — ABNORMAL HIGH (ref 11.5–15.5)
WBC: 7.3 10*3/uL (ref 4.0–10.5)

## 2021-07-05 LAB — HEPATIC FUNCTION PANEL
ALT: 7 U/L (ref 0–53)
AST: 11 U/L (ref 0–37)
Albumin: 3.9 g/dL (ref 3.5–5.2)
Alkaline Phosphatase: 80 U/L (ref 39–117)
Bilirubin, Direct: 0.2 mg/dL (ref 0.0–0.3)
Total Bilirubin: 0.7 mg/dL (ref 0.2–1.2)
Total Protein: 6.1 g/dL (ref 6.0–8.3)

## 2021-07-05 LAB — TSH: TSH: 0.42 u[IU]/mL (ref 0.35–5.50)

## 2021-07-05 LAB — HEMOGLOBIN A1C: Hgb A1c MFr Bld: 5.5 % (ref 4.6–6.5)

## 2021-07-05 LAB — T3, FREE: T3, Free: 2.8 pg/mL (ref 2.3–4.2)

## 2021-07-05 NOTE — Patient Instructions (Signed)
[name] , Thank you for taking time to complete your Medicare Wellness Visit. I appreciate your ongoing commitment to your health goals. Please review the following plan we discussed and let me know if I can assist you in the future.   Screening recommendations/referrals: Colonoscopy: no longer required  ophthalmology/optometry visit : up to date Recommended yearly dental visit for hygiene and checkup  Vaccinations: Influenza vaccine: discuss with PCP if you change your mind Pneumococcal vaccine: up to date Tdap vaccine: due, 09/06/16, medicare may cover in the event you are cut or injured  Shingles vaccine: Discuss with pharmacy   Covid-19: newest booster available at your local pharmacy  Advanced directives: Please bring a copy of Living Will and/or Healthcare Power of Attorney for your chart.   Conditions/risks identified: see problem list   Next appointment: Follow up in one year for your annual wellness visit.   Preventive Care 8 Years and Older, Male Preventive care refers to lifestyle choices and visits with your health care provider that can promote health and wellness. What does preventive care include? A yearly physical exam. This is also called an annual well check. Dental exams once or twice a year. Routine eye exams. Ask your health care provider how often you should have your eyes checked. Personal lifestyle choices, including: Daily care of your teeth and gums. Regular physical activity. Eating a healthy diet. Avoiding tobacco and drug use. Limiting alcohol use. Practicing safe sex. Taking low doses of aspirin every day. Taking vitamin and mineral supplements as recommended by your health care provider. What happens during an annual well check? The services and screenings done by your health care provider during your annual well check will depend on your age, overall health, lifestyle risk factors, and family history of disease. Counseling  Your health care  provider may ask you questions about your: Alcohol use. Tobacco use. Drug use. Emotional well-being. Home and relationship well-being. Sexual activity. Eating habits. History of falls. Memory and ability to understand (cognition). Work and work Statistician. Screening  You may have the following tests or measurements: Height, weight, and BMI. Blood pressure. Lipid and cholesterol levels. These may be checked every 5 years, or more frequently if you are over 51 years old. Skin check. Lung cancer screening. You may have this screening every year starting at age 18 if you have a 30-pack-year history of smoking and currently smoke or have quit within the past 15 years. Fecal occult blood test (FOBT) of the stool. You may have this test every year starting at age 69. Flexible sigmoidoscopy or colonoscopy. You may have a sigmoidoscopy every 5 years or a colonoscopy every 10 years starting at age 45. Prostate cancer screening. Recommendations will vary depending on your family history and other risks. Hepatitis C blood test. Hepatitis B blood test. Sexually transmitted disease (STD) testing. Diabetes screening. This is done by checking your blood sugar (glucose) after you have not eaten for a while (fasting). You may have this done every 1-3 years. Abdominal aortic aneurysm (AAA) screening. You may need this if you are a current or former smoker. Osteoporosis. You may be screened starting at age 79 if you are at high risk. Talk with your health care provider about your test results, treatment options, and if necessary, the need for more tests. Vaccines  Your health care provider may recommend certain vaccines, such as: Influenza vaccine. This is recommended every year. Tetanus, diphtheria, and acellular pertussis (Tdap, Td) vaccine. You may need a Td booster  every 10 years. Zoster vaccine. You may need this after age 53. Pneumococcal 13-valent conjugate (PCV13) vaccine. One dose is  recommended after age 83. Pneumococcal polysaccharide (PPSV23) vaccine. One dose is recommended after age 75. Talk to your health care provider about which screenings and vaccines you need and how often you need them. This information is not intended to replace advice given to you by your health care provider. Make sure you discuss any questions you have with your health care provider. Document Released: 05/08/2015 Document Revised: 12/30/2015 Document Reviewed: 02/10/2015 Elsevier Interactive Patient Education  2017 [employer] Prevention in the Home Falls can cause injuries. They can happen to people of all ages. There are many things you can do to make your home safe and to help prevent falls. What can I do on the outside of my home? Regularly fix the edges of walkways and driveways and fix any cracks. Remove anything that might make you trip as you walk through a door, such as a raised step or threshold. Trim any bushes or trees on the path to your home. Use bright outdoor lighting. Clear any walking paths of anything that might make someone trip, such as rocks or tools. Regularly check to see if handrails are loose or broken. Make sure that both sides of any steps have handrails. Any raised decks and porches should have guardrails on the edges. Have any leaves, snow, or ice cleared regularly. Use sand or salt on walking paths during winter. Clean up any spills in your garage right away. This includes oil or grease spills. What can I do in the bathroom? Use night lights. Install grab bars by the toilet and in the tub and shower. Do not use towel bars as grab bars. Use non-skid mats or decals in the tub or shower. If you need to sit down in the shower, use a plastic, non-slip stool. Keep the floor dry. Clean up any water that spills on the floor as soon as it happens. Remove soap buildup in the tub or shower regularly. Attach bath mats securely with double-sided non-slip rug  tape. Do not have throw rugs and other things on the floor that can make you trip. What can I do in the bedroom? Use night lights. Make sure that you have a light by your bed that is easy to reach. Do not use any sheets or blankets that are too big for your bed. They should not hang down onto the floor. Have a firm chair that has side arms. You can use this for support while you get dressed. Do not have throw rugs and other things on the floor that can make you trip. What can I do in the kitchen? Clean up any spills right away. Avoid walking on wet floors. Keep items that you use a lot in easy-to-reach places. If you need to reach something above you, use a strong step stool that has a grab bar. Keep electrical cords out of the way. Do not use floor polish or wax that makes floors slippery. If you must use wax, use non-skid floor wax. Do not have throw rugs and other things on the floor that can make you trip. What can I do with my stairs? Do not leave any items on the stairs. Make sure that there are handrails on both sides of the stairs and use them. Fix handrails that are broken or loose. Make sure that handrails are as long as the stairways. Check any carpeting to  make sure that it is firmly attached to the stairs. Fix any carpet that is loose or worn. Avoid having throw rugs at the top or bottom of the stairs. If you do have throw rugs, attach them to the floor with carpet tape. Make sure that you have a light switch at the top of the stairs and the bottom of the stairs. If you do not have them, ask someone to add them for you. What else can I do to help prevent falls? Wear shoes that: Do not have high heels. Have rubber bottoms. Are comfortable and fit you well. Are closed at the toe. Do not wear sandals. If you use a stepladder: Make sure that it is fully opened. Do not climb a closed stepladder. Make sure that both sides of the stepladder are locked into place. Ask someone to  hold it for you, if possible. Clearly mark and make sure that you can see: Any grab bars or handrails. First and last steps. Where the edge of each step is. Use tools that help you move around (mobility aids) if they are needed. These include: Canes. Walkers. Scooters. Crutches. Turn on the lights when you go into a dark area. Replace any light bulbs as soon as they burn out. Set up your furniture so you have a clear path. Avoid moving your furniture around. If any of your floors are uneven, fix them. If there are any pets around you, be aware of where they are. Review your medicines with your doctor. Some medicines can make you feel dizzy. This can increase your chance of falling. Ask your doctor what other things that you can do to help prevent falls. This information is not intended to replace advice given to you by your health care provider. Make sure you discuss any questions you have with your health care provider. Document Released: 02/05/2009 Document Revised: 09/17/2015 Document Reviewed: 05/16/2014 Elsevier Interactive Patient Education  2017 [employer].

## 2021-07-11 NOTE — Progress Notes (Signed)
? ? ?Auguste Tebbetts T. Byrd Terrero, MD, [facility] ?Therapist, music at [facility] ?[address] ?[place], [address] ? ?Phone: [phone]  FAX: [phone] ? ?[name] - 81 y.o. male  MRN [id number]  Date of Birth: 08/14/1940 ? ?Date: 07/12/2021  PCP: Owens Loffler, MD  Referral: Owens Loffler, MD ? ?Chief Complaint  ?Patient presents with  ? Medicare Wellness  ?  Part 2   ? ? ?This visit occurred during the SARS-CoV-2 public health emergency.  Safety protocols were in place, including screening questions prior to the visit, additional usage of staff PPE, and extensive cleaning of exam room while observing appropriate contact time as indicated for disinfecting solutions.  ? ?Subjective:  ? ?[name] is an 81 y.o. very pleasant male patient with Body mass index is 25.83 kg/m?. who presents with the following: ? ?F/u multiple medical problems: ? ?CAD, CHF, COPD, prior ICH. ? ?Renal artery stenosis ?AAA ?Severe PAD ?Dr. Raeanne Barry is his Cardiologist, very compliant ? ?HTN: Tolerating all medications without side effects ?Stable and at goal ?No CP, no sob. No HA. ? ?Chronically on Coumadin ?On Pletal, as well ? ?Lipids: Doing well, stable. Tolerating meds fine with no SE. ?Panel reviewed with patient. ? ?LE are what limit him.  ? ?R side of the neck is sore. ? ?Got out of his shower, went into his bedroom. ?Was standing, R foot felt ?Sitting on the edge of the bed ?Went back to normal in a few mins ?No injury ? ?Rash - ? ?Going to [employer], very frustrated.  ?TAC ointment ? ?Health Maintenance  ?Topic Date Due  ? Zoster Vaccines- Shingrix (1 of 2) Never done  ? COVID-19 Vaccine (4 - Booster for [place] series) 04/06/2020  ? INFLUENZA VACCINE  07/23/2021 (Originally 11/23/2020)  ? TETANUS/TDAP  09/06/2026 (Originally 09/06/2016)  ? Pneumonia Vaccine 83+ Years old  Completed  ? HPV VACCINES  Aged Out  ? ?Immunization History  ?Administered Date(s) Administered  ? PFIZER(Purple  Top)SARS-COV-2 Vaccination 05/01/2019, 05/22/2019, 02/10/2020  ? Pneumococcal Conjugate-13 02/20/2014  ? Pneumococcal Polysaccharide-23 04/11/2008  ? Td 09/07/2006  ?  ? ?Lipids: ?Lab Results  ?Component Value Date  ? CHOL 121 07/05/2021  ? ?Lab Results  ?Component Value Date  ? HDL 59.40 07/05/2021  ? ?Lab Results  ?Component Value Date  ? LDLCALC 39 07/05/2021  ? ?Lab Results  ?Component Value Date  ? TRIG 115.0 07/05/2021  ? ?Lab Results  ?Component Value Date  ? CHOLHDL 2 07/05/2021  ? ? ?Lab Results  ?Component Value Date  ? ALT 7 07/05/2021  ? AST 11 07/05/2021  ? ALKPHOS 80 07/05/2021  ? BILITOT 0.7 07/05/2021  ?  ? ?BP Readings from Last 3 Encounters:  ?07/12/21 118/60  ?06/01/21 130/64  ?05/12/21 (!) 189/81  ? ? ?Basic Metabolic Panel: ?   ?Component Value Date/Time  ? NA 139 07/05/2021 0849  ? NA 142 05/06/2021 0953  ? NA 136 06/03/2013 0454  ? K 4.0 07/05/2021 0849  ? K 4.1 06/03/2013 0454  ? CL 102 07/05/2021 0849  ? CL 106 06/03/2013 0454  ? CO2 29 07/05/2021 0849  ? CO2 27 06/03/2013 0454  ? BUN 24 (H) 07/05/2021 0849  ? BUN 22 05/06/2021 0953  ? BUN 22 (H) 06/03/2013 0454  ? CREATININE 1.47 07/05/2021 0849  ? CREATININE 1.59 (H) 06/03/2013 0454  ? CREATININE 1.02 04/22/2011 1507  ? GLUCOSE 78 07/05/2021 0849  ? GLUCOSE 80 06/03/2013 0454  ?  CALCIUM 9.2 07/05/2021 0849  ? CALCIUM 8.8 06/03/2013 0454  ?  ?Thyroid: No symptoms. Labs reviewed. Denies cold / heat intolerance, dry skin, hair loss. No goiter. ? ?Lab Results  ?Component Value Date  ? TSH 0.42 07/05/2021  ?  ? ?Review of Systems is noted in the HPI, as appropriate ? ?Objective:  ? ?BP 118/60   Pulse 87   Temp 97.7 ?F (36.5 ?C) (Oral)   Ht 5' 8.25" (1.734 m)   Wt 171 lb 2 oz (77.6 kg)   SpO2 98%   BMI 25.83 kg/m?  ? ?GEN: No acute distress; alert,appropriate. ?PULM: Breathing comfortably in no respiratory distress ?PSYCH: Normally interactive.  ?CV: RRR, no m/g/r  ?PULM: Normal respiratory rate, no accessory muscle use.  Diffuse wheezing  bilaterally ? ?Laboratory and Imaging Data: ?Results for orders placed or performed in visit on 07/05/21  ?T4, free  ?Result Value Ref Range  ? Free T4 1.72 (H) 0.60 - 1.60 ng/dL  ?T3, free  ?Result Value Ref Range  ? T3, Free 2.8 2.3 - 4.2 pg/mL  ?TSH  ?Result Value Ref Range  ? TSH 0.42 0.35 - 5.50 uIU/mL  ?Hemoglobin A1c  ?Result Value Ref Range  ? Hgb A1c MFr Bld 5.5 4.6 - 6.5 %  ?CBC with Differential/Platelet  ?Result Value Ref Range  ? WBC 7.3 4.0 - 10.5 K/uL  ? RBC 4.68 4.22 - 5.81 Mil/uL  ? Hemoglobin 13.5 13.0 - 17.0 g/dL  ? HCT 41.3 39.0 - 52.0 %  ? MCV 88.4 78.0 - 100.0 fl  ? MCHC 32.7 30.0 - 36.0 g/dL  ? RDW 15.6 (H) 11.5 - 15.5 %  ? Platelets 219.0 150.0 - 400.0 K/uL  ? Neutrophils Relative % 59.9 43.0 - 77.0 %  ? Lymphocytes Relative 23.7 12.0 - 46.0 %  ? Monocytes Relative 11.2 3.0 - 12.0 %  ? Eosinophils Relative 4.5 0.0 - 5.0 %  ? Basophils Relative 0.7 0.0 - 3.0 %  ? Neutro Abs 4.4 1.4 - 7.7 K/uL  ? Lymphs Abs 1.7 0.7 - 4.0 K/uL  ? Monocytes Absolute 0.8 0.1 - 1.0 K/uL  ? Eosinophils Absolute 0.3 0.0 - 0.7 K/uL  ? Basophils Absolute 0.0 0.0 - 0.1 K/uL  ?Basic metabolic panel  ?Result Value Ref Range  ? Sodium 139 135 - 145 mEq/L  ? Potassium 4.0 3.5 - 5.1 mEq/L  ? Chloride 102 96 - 112 mEq/L  ? CO2 29 19 - 32 mEq/L  ? Glucose, Bld 78 70 - 99 mg/dL  ? BUN 24 (H) 6 - 23 mg/dL  ? Creatinine, Ser 1.47 0.40 - 1.50 mg/dL  ? GFR 44.90 (L) >60.00 mL/min  ? Calcium 9.2 8.4 - 10.5 mg/dL  ?Hepatic function panel  ?Result Value Ref Range  ? Total Bilirubin 0.7 0.2 - 1.2 mg/dL  ? Bilirubin, Direct 0.2 0.0 - 0.3 mg/dL  ? Alkaline Phosphatase 80 39 - 117 U/L  ? AST 11 0 - 37 U/L  ? ALT 7 0 - 53 U/L  ? Total Protein 6.1 6.0 - 8.3 g/dL  ? Albumin 3.9 3.5 - 5.2 g/dL  ?Lipid panel  ?Result Value Ref Range  ? Cholesterol 121 0 - 200 mg/dL  ? Triglycerides 115.0 0.0 - 149.0 mg/dL  ? HDL 59.40 >39.00 mg/dL  ? VLDL 23.0 0.0 - 40.0 mg/dL  ? LDL Cholesterol 39 0 - 99 mg/dL  ? Total CHOL/HDL Ratio 2   ? NonHDL 62.07   ?   ? ?Assessment  and Plan:  ? ?  ICD-10-CM   ?1. Peripheral arterial disease (HCC)  I73.9   ?  ?2. Panlobular emphysema ([place])  J43.1   ?  ?3. Ischemic cardiomyopathy  I25.5   ?  ?4. Other specified hypothyroidism  E03.8   ?  ?5. Bilateral renal artery stenosis (HCC)  I70.1   ?  ?6. History of prior cigarette smoking  Z87.891   ?  ?7. Nonrheumatic aortic valve stenosis  I35.0   ?  ?8. Paroxysmal a-fib: briefly in ED now in NSR  I48.0   ?  ?9. HYPERCHOLESTEROLEMIA  E78.00   ?  ? ?Total encounter time: 40 minutes. This includes total time spent on the day of encounter.   ? ?We spent the vast majority of the in entire office visit discussing his various medical conditions, and their severity. ? ?Primarily limiting right now as his peripheral vascular disease, and there are times where he can only walk 15-20 steps without stopping.  This is very challenging situation, and he does not have any good solutions here.  Currently under medical management.  He has had many different stents throughout multiple parts of his body over the years.  He certainly has diffuse cardiovascular disease. ? ?He and his wife had a lot of questions, and I tried to be frank.  I think that his primary issues entirely are vascular/cardiovascular.  I think that this is going to be his limiting aspect of his function indefinitely. ? ?While he does have COPD, he does not have lung cancer, and his severe peripheral vascular disease is limiting his activity a lot more than his COPD.  Also think that his chronic kidney disease, well chronic, will not be a limiting factor him long-term. ? ?I did my best to answer their questions.  He is certainly not in good shape, and tried to explain his various conditions.   ? ?Basic things such as high blood pressure and cholesterol are stable. ? ?No orders of the defined types were placed in this encounter. ? ?Medications Discontinued During This Encounter  ?Medication Reason  ? budesonide-formoterol (SYMBICORT)  160-4.5 MCG/ACT inhaler Patient Preference  ? triamcinolone cream (KENALOG) 0.1 % Patient Preference  ? triamcinolone ointment (KENALOG) 0.1 % Patient Preference  ? ?No orders of the defined types were placed in this encounte

## 2021-07-12 ENCOUNTER — Encounter: Payer: Self-pay | Admitting: Family Medicine

## 2021-07-12 ENCOUNTER — Ambulatory Visit (INDEPENDENT_AMBULATORY_CARE_PROVIDER_SITE_OTHER): Payer: Medicare Other | Admitting: Family Medicine

## 2021-07-12 ENCOUNTER — Other Ambulatory Visit: Payer: Self-pay

## 2021-07-12 VITALS — BP 118/60 | HR 87 | Temp 97.7°F | Ht 68.25 in | Wt 171.1 lb

## 2021-07-12 DIAGNOSIS — I255 Ischemic cardiomyopathy: Secondary | ICD-10-CM

## 2021-07-12 DIAGNOSIS — Z87891 Personal history of nicotine dependence: Secondary | ICD-10-CM

## 2021-07-12 DIAGNOSIS — J431 Panlobular emphysema: Secondary | ICD-10-CM | POA: Diagnosis not present

## 2021-07-12 DIAGNOSIS — I350 Nonrheumatic aortic (valve) stenosis: Secondary | ICD-10-CM | POA: Diagnosis not present

## 2021-07-12 DIAGNOSIS — I739 Peripheral vascular disease, unspecified: Secondary | ICD-10-CM | POA: Diagnosis not present

## 2021-07-12 DIAGNOSIS — E038 Other specified hypothyroidism: Secondary | ICD-10-CM

## 2021-07-12 DIAGNOSIS — E7800 Pure hypercholesterolemia, unspecified: Secondary | ICD-10-CM | POA: Diagnosis not present

## 2021-07-12 DIAGNOSIS — I701 Atherosclerosis of renal artery: Secondary | ICD-10-CM | POA: Diagnosis not present

## 2021-07-12 DIAGNOSIS — I480 Paroxysmal atrial fibrillation: Secondary | ICD-10-CM

## 2021-07-12 NOTE — Patient Instructions (Signed)
Covid Bivalent vaccine ?

## 2021-07-29 ENCOUNTER — Ambulatory Visit (INDEPENDENT_AMBULATORY_CARE_PROVIDER_SITE_OTHER): Payer: Medicare Other

## 2021-07-29 DIAGNOSIS — Z7901 Long term (current) use of anticoagulants: Secondary | ICD-10-CM | POA: Diagnosis not present

## 2021-07-29 LAB — POCT INR: INR: 2.1 (ref 2.0–3.0)

## 2021-07-29 NOTE — Progress Notes (Signed)
Continue 1/2 tablet (2.5 mg) daily except take 1 tablet (5 mg) on Mon, Wed and Fri. Recheck in 6 weeks.  ?

## 2021-07-29 NOTE — Patient Instructions (Addendum)
Pre visit review using our clinic review tool, if applicable. No additional management support is needed unless otherwise documented below in the visit note. ? ?Continue 1/2 tablet (2.5 mg) daily except take 1 tablet (5 mg) on Mon, Wed and Fri. Recheck in 6 weeks.  ?

## 2021-08-02 DIAGNOSIS — I739 Peripheral vascular disease, unspecified: Secondary | ICD-10-CM | POA: Diagnosis not present

## 2021-08-02 DIAGNOSIS — M79675 Pain in left toe(s): Secondary | ICD-10-CM | POA: Diagnosis not present

## 2021-08-02 DIAGNOSIS — B351 Tinea unguium: Secondary | ICD-10-CM | POA: Diagnosis not present

## 2021-08-02 DIAGNOSIS — M79674 Pain in right toe(s): Secondary | ICD-10-CM | POA: Diagnosis not present

## 2021-08-02 DIAGNOSIS — L600 Ingrowing nail: Secondary | ICD-10-CM | POA: Diagnosis not present

## 2021-08-03 ENCOUNTER — Other Ambulatory Visit: Payer: Self-pay | Admitting: Family Medicine

## 2021-08-03 ENCOUNTER — Other Ambulatory Visit: Payer: Self-pay | Admitting: Cardiovascular Disease

## 2021-08-29 ENCOUNTER — Other Ambulatory Visit: Payer: Self-pay | Admitting: Dermatology

## 2021-08-29 DIAGNOSIS — R21 Rash and other nonspecific skin eruption: Secondary | ICD-10-CM

## 2021-09-09 ENCOUNTER — Ambulatory Visit (INDEPENDENT_AMBULATORY_CARE_PROVIDER_SITE_OTHER): Payer: Medicare Other

## 2021-09-09 DIAGNOSIS — Z7901 Long term (current) use of anticoagulants: Secondary | ICD-10-CM

## 2021-09-09 LAB — POCT INR: INR: 2 (ref 2.0–3.0)

## 2021-09-09 NOTE — Patient Instructions (Addendum)
Pre visit review using our clinic review tool, if applicable. No additional management support is needed unless otherwise documented below in the visit note.  Change weekly dose to take 1 tablet (5 mg) daily except take 1/2 tablet (2.5 mg) on Tues, Thurs, and Sat.  Recheck in 5 weeks per pt request.

## 2021-09-09 NOTE — Progress Notes (Addendum)
Pt has been on the lower end of his range or below his range for the last 4 visits so a change to his weekly dosing has been made to try to bring INR more to the goal of 2.5.  Change weekly dose to take 1 tablet (5 mg) daily except take 1/2 tablet (2.5 mg) on Tues, Thurs, and Sat.  Recheck in 5 weeks per pt request.

## 2021-09-12 ENCOUNTER — Other Ambulatory Visit: Payer: Self-pay | Admitting: Cardiovascular Disease

## 2021-09-12 ENCOUNTER — Other Ambulatory Visit: Payer: Self-pay | Admitting: Dermatology

## 2021-09-12 DIAGNOSIS — R21 Rash and other nonspecific skin eruption: Secondary | ICD-10-CM

## 2021-09-22 ENCOUNTER — Encounter: Payer: Self-pay | Admitting: Nurse Practitioner

## 2021-09-22 ENCOUNTER — Ambulatory Visit (INDEPENDENT_AMBULATORY_CARE_PROVIDER_SITE_OTHER): Payer: Medicare Other | Admitting: Nurse Practitioner

## 2021-09-22 VITALS — BP 108/58 | HR 80 | Temp 97.6°F | Resp 58 | Ht 68.25 in | Wt 174.6 lb

## 2021-09-22 DIAGNOSIS — I83023 Varicose veins of left lower extremity with ulcer of ankle: Secondary | ICD-10-CM | POA: Diagnosis not present

## 2021-09-22 DIAGNOSIS — L97321 Non-pressure chronic ulcer of left ankle limited to breakdown of skin: Secondary | ICD-10-CM

## 2021-09-22 NOTE — Assessment & Plan Note (Signed)
Patient has a long history of circulatory compromise inclusive of peripheral artery disease.  Patient is being followed by vascular surgery but no surgical interventions can be done due to patient's age and comorbidities.  Venous ulcer likely to the left medial ankle.  See clinical photo. Patient already using compression stockings.  Will refer to wound clinic, ambulatory referral placed today.  No indication for antibiotics at this current time.  Will cover with a nonadherent and an koban and patient to clean with soap and water daily signs and symptoms reviewed when to seek care in regards to infection.  Low threshold to treat given patient's comorbidities

## 2021-09-22 NOTE — Patient Instructions (Signed)
Nice to see you today I have placed a referral to the wound clinic If it starts having color drainage, redness spreads, gets hot to the touch, or he starts running a fever follow up with [place]

## 2021-09-22 NOTE — Progress Notes (Signed)
   Acute Office Visit  Subjective:     Patient ID: [name], male    DOB: [birth year], 81 y.o.   MRN: [id number]  Chief Complaint  Patient presents with   skin lesion    X 1 week, ulcer looking area on the left foot. Some tenderness to the area. Some swelling and redness and warmth present.    HPI Patient is in today for lower extremity wound  Noticed it approx 1 week ago. States he did not injury it that he knows of. States that it does cause discomfort of a burning sensation. Poor circulation of bilateral feet. Has a history of ulcer on the same where they closed the vessels.  Does have claudication and Dr. Fletcher Anon is the vascular surgeon  3.5cm x 3 cm  1cm x 1 cm center Review of Systems  Constitutional:  Negative for chills and fever.  Skin:  Negative for itching.       Skin lesion "+" Warm to touch "+" No drainage       Objective:    BP (!) 108/58   Pulse 80   Temp 97.6 F (36.4 C)   Resp (!) 58   Ht 5' 8.25" (1.734 m)   Wt 174 lb 9 oz (79.2 kg)   SpO2 96%   BMI 26.35 kg/m    Physical Exam Vitals and nursing note reviewed.  Constitutional:      Appearance: Normal appearance. He is obese.  Cardiovascular:     Rate and Rhythm: Normal rate and regular rhythm.     Heart sounds: Normal heart sounds.  Pulmonary:     Effort: Pulmonary effort is normal.     Breath sounds: Wheezing present.  Musculoskeletal:     Right lower leg: No edema.     Left lower leg: Edema present.  Skin:    Coloration: Skin is pale.     Comments: Ruddy purple discoloration to bilateral lower feet.  No drainage appreciated on wound.  No warmth to touch or increased edema.  No tenderness to palpation  Neurological:     Mental Status: He is alert.     No results found for any visits on 09/22/21.      Assessment & Plan:   Problem List Items Addressed This Visit       Cardiovascular and Mediastinum   Venous stasis ulcer of left ankle limited to breakdown of skin with  varicose veins ([place]) - Primary    Patient has a long history of circulatory compromise inclusive of peripheral artery disease.  Patient is being followed by vascular surgery but no surgical interventions can be done due to patient's age and comorbidities.  Venous ulcer likely to the left medial ankle.  See clinical photo. Patient already using compression stockings.  Will refer to wound clinic, ambulatory referral placed today.  No indication for antibiotics at this current time.  Will cover with a nonadherent and an koban and patient to clean with soap and water daily signs and symptoms reviewed when to seek care in regards to infection.  Low threshold to treat given patient's comorbidities       Relevant Orders   Ambulatory referral to Wound Clinic    No orders of the defined types were placed in this encounter.   Return if symptoms worsen or fail to improve.  Romilda Garret, NP

## 2021-10-05 ENCOUNTER — Encounter: Payer: Medicare Other | Attending: Physician Assistant | Admitting: Internal Medicine

## 2021-10-05 DIAGNOSIS — I872 Venous insufficiency (chronic) (peripheral): Secondary | ICD-10-CM | POA: Diagnosis not present

## 2021-10-05 DIAGNOSIS — I5032 Chronic diastolic (congestive) heart failure: Secondary | ICD-10-CM | POA: Diagnosis not present

## 2021-10-05 DIAGNOSIS — I130 Hypertensive heart and chronic kidney disease with heart failure and stage 1 through stage 4 chronic kidney disease, or unspecified chronic kidney disease: Secondary | ICD-10-CM | POA: Diagnosis not present

## 2021-10-05 DIAGNOSIS — J449 Chronic obstructive pulmonary disease, unspecified: Secondary | ICD-10-CM | POA: Diagnosis not present

## 2021-10-05 DIAGNOSIS — I87312 Chronic venous hypertension (idiopathic) with ulcer of left lower extremity: Secondary | ICD-10-CM | POA: Diagnosis not present

## 2021-10-05 DIAGNOSIS — L97328 Non-pressure chronic ulcer of left ankle with other specified severity: Secondary | ICD-10-CM | POA: Insufficient documentation

## 2021-10-05 DIAGNOSIS — Z87891 Personal history of nicotine dependence: Secondary | ICD-10-CM | POA: Diagnosis not present

## 2021-10-05 DIAGNOSIS — I87332 Chronic venous hypertension (idiopathic) with ulcer and inflammation of left lower extremity: Secondary | ICD-10-CM | POA: Diagnosis not present

## 2021-10-05 DIAGNOSIS — I70212 Atherosclerosis of native arteries of extremities with intermittent claudication, left leg: Secondary | ICD-10-CM | POA: Diagnosis not present

## 2021-10-05 DIAGNOSIS — N1832 Chronic kidney disease, stage 3b: Secondary | ICD-10-CM | POA: Diagnosis not present

## 2021-10-05 DIAGNOSIS — L97322 Non-pressure chronic ulcer of left ankle with fat layer exposed: Secondary | ICD-10-CM | POA: Diagnosis not present

## 2021-10-05 NOTE — Progress Notes (Signed)
ZOLLIE, ELLERY ([id number]) Visit Report for 10/05/2021 Abuse Risk Screen Details Patient Name: [name], [name] Date of Service: 10/05/2021 8:45 AM Medical Record Number: [id number] Patient Account Number: [id number] Date of Birth/Sex: Nov 02, 1940 (81 y.o. M) Treating RN: Carlene Coria Primary Care Bristyl Mclees: Owens Loffler Other Clinician: Referring Taejah Ohalloran: Karl Ito Treating Osiris Odriscoll/Extender: Tito Dine in Treatment: 0 Abuse Risk Screen Items Answer ABUSE RISK SCREEN: Has anyone close to you tried to hurt or harm you recentlyo No Do you feel uncomfortable with anyone in your familyo No Has anyone forced you do things that you didnot want to doo No Electronic Signature(s) Signed: 10/05/2021 2:49:16 PM By: Carlene Coria RN Entered By: Carlene Coria on 10/05/2021 09:10:09 [name], [name] ([id number]) -------------------------------------------------------------------------------- Activities of Daily Living Details Patient Name: [name], [name] Date of Service: 10/05/2021 8:45 AM Medical Record Number: [id number] Patient Account Number: [id number] Date of Birth/Sex: Jan 07, 1941 (81 y.o. M) Treating RN: Carlene Coria Primary Care Azaliah Carrero: Owens Loffler Other Clinician: Referring Everlyn Farabaugh: Karl Ito Treating Steve Youngberg/Extender: Tito Dine in Treatment: 0 Activities of Daily Living Items Answer Activities of Daily Living (Please select one for each item) Drive Automobile Completely Able Take Medications Completely Able Use Telephone Completely Able Care for Appearance Completely Able Use Toilet Completely Able Bath / Shower Completely Able Dress Self Completely Able Feed Self Completely Able Walk Completely Able Get In / Out Bed Completely Able Housework Completely Able Prepare Meals Completely Able Handle Money Completely Able Shop for Self Completely Able Electronic Signature(s) Signed: 10/05/2021 2:49:16 PM By: Carlene Coria RN Entered By:  Carlene Coria on 10/05/2021 09:10:47 [name], [name] ([id number]) -------------------------------------------------------------------------------- Education Screening Details Patient Name: [name], [name] Date of Service: 10/05/2021 8:45 AM Medical Record Number: [id number] Patient Account Number: [id number] Date of Birth/Sex: Oct 14, 1940 (81 y.o. M) Treating RN: Carlene Coria Primary Care Avram Danielson: Owens Loffler Other Clinician: Referring Lemarcus Baggerly: Karl Ito Treating Maryori Weide/Extender: Tito Dine in Treatment: 0 Primary Learner Assessed: Patient Learning Preferences/Education Level/Primary Language Learning Preference: Explanation Highest Education Level: College or Above Preferred Language: English Cognitive Barrier Language Barrier: No Translator Needed: No Memory Deficit: No Cultural/Religious Beliefs Affecting Medical Care: No Physical Barrier Impaired Vision: Yes Glasses Impaired Hearing: No Decreased Hand dexterity: No Knowledge/Comprehension Knowledge Level: Medium Comprehension Level: High Ability to understand written instructions: High Ability to understand verbal instructions: High Motivation Anxiety Level: Calm Cooperation: Cooperative Education Importance: Acknowledges Need Interest in Health Problems: Asks Questions Perception: Coherent Willingness to Engage in Self-Management High Activities: Readiness to Engage in Self-Management High Activities: Electronic Signature(s) Signed: 10/05/2021 2:49:16 PM By: Carlene Coria RN Entered By: Carlene Coria on 10/05/2021 09:11:15 [name], [name] ([id number]) -------------------------------------------------------------------------------- Fall Risk Assessment Details Patient Name: [name], [name] Date of Service: 10/05/2021 8:45 AM Medical Record Number: [id number] Patient Account Number: [id number] Date of Birth/Sex: 1941-02-16 (81 y.o. M) Treating RN: Carlene Coria Primary Care Kynslei Art: Owens Loffler Other Clinician: Referring Shenetta Schnackenberg: Karl Ito Treating Kerensa Nicklas/Extender: Tito Dine in Treatment: 0 Fall Risk Assessment Items Have you had 2 or more falls in the last 12 monthso 0 No Have you had any fall that resulted in injury in the last 12 monthso 0 No FALLS RISK SCREEN History of falling - immediate or within 3 months 0 No Secondary diagnosis (Do you have 2 or more medical diagnoseso) 0 No Ambulatory aid None/bed rest/wheelchair/nurse 0 No Crutches/cane/walker 0 No Furniture 0 No Intravenous therapy Access/Saline/Heparin Lock 0 No Gait/Transferring Normal/ bed rest/ wheelchair 0 No Weak (short  steps with or without shuffle, stooped but able to lift head while walking, may 0 No seek support from furniture) Impaired (short steps with shuffle, may have difficulty arising from chair, head down, impaired 0 No balance) Mental Status Oriented to own ability 0 No Electronic Signature(s) Signed: 10/05/2021 2:49:16 PM By: Carlene Coria RN Entered By: Carlene Coria on 10/05/2021 09:11:21 [name], [name] ([id number]) -------------------------------------------------------------------------------- Foot Assessment Details Patient Name: [name], [name] Date of Service: 10/05/2021 8:45 AM Medical Record Number: [id number] Patient Account Number: [id number] Date of Birth/Sex: 1940/12/12 (81 y.o. M) Treating RN: Carlene Coria Primary Care Altonio Schwertner: Owens Loffler Other Clinician: Referring Cheyan Frees: Karl Ito Treating Shailene Demonbreun/Extender: Tito Dine in Treatment: 0 Foot Assessment Items Site Locations + = Sensation present, - = Sensation absent, C = Callus, U = Ulcer R = Redness, W = Warmth, M = Maceration, PU = Pre-ulcerative lesion F = Fissure, S = Swelling, D = Dryness Assessment Right: Left: Other Deformity: No No Prior Foot Ulcer: No No Prior Amputation: No No Charcot Joint: No No Ambulatory Status: Ambulatory Without Help Gait:  Steady Electronic Signature(s) Signed: 10/05/2021 2:49:16 PM By: Carlene Coria RN Entered By: Carlene Coria on 10/05/2021 09:14:08 [name], [name] ([id number]) -------------------------------------------------------------------------------- Nutrition Risk Screening Details Patient Name: [name], [name] Date of Service: 10/05/2021 8:45 AM Medical Record Number: [id number] Patient Account Number: [id number] Date of Birth/Sex: August 05, 1940 (81 y.o. M) Treating RN: Carlene Coria Primary Care Natash Berman: Owens Loffler Other Clinician: Referring Tonilynn Bieker: Karl Ito Treating Larrisa Cravey/Extender: Tito Dine in Treatment: 0 Height (in): 69 Weight (lbs): 174 Body Mass Index (BMI): 25.7 Nutrition Risk Screening Items Score Screening NUTRITION RISK SCREEN: I have an illness or condition that made me change the kind and/or amount of food I eat 0 No I eat fewer than two meals per day 0 No I eat few fruits and vegetables, or milk products 0 No I have three or more drinks of beer, liquor or wine almost every day 0 No I have tooth or mouth problems that make it hard for me to eat 0 No I don't always have enough money to buy the food I need 0 No I eat alone most of the time 0 No I take three or more different prescribed or over-the-counter drugs a day 1 Yes Without wanting to, I have lost or gained 10 pounds in the last six months 0 No I am not always physically able to shop, cook and/or feed myself 0 No Nutrition Protocols Good Risk Protocol 0 No interventions needed Moderate Risk Protocol High Risk Proctocol Risk Level: Good Risk Score: 1 Electronic Signature(s) Signed: 10/05/2021 2:49:16 PM By: Carlene Coria RN Entered By: Carlene Coria on 10/05/2021 09:11:34

## 2021-10-06 NOTE — Progress Notes (Signed)
BERTON, BUTRICK ([id number]) Visit Report for 10/05/2021 Allergy List Details Patient Name: TATSCH, Nael T. Date of Service: 10/05/2021 8:45 AM Medical Record Number: [id number] Patient Account Number: [id number] Date of Birth/Sex: Sep 21, 1940 (81 y.o. M) Treating RN: Carlene Coria Primary Care Laelani Vasko: Owens Loffler Other Clinician: Referring Ottilia Pippenger: Karl Ito Treating Daria Mcmeekin/Extender: Ricard Dillon Weeks in Treatment: 0 Allergies Active Allergies Plavix Allergy Notes Electronic Signature(s) Signed: 10/05/2021 2:49:16 PM By: Carlene Coria RN Entered By: Carlene Coria on 10/05/2021 09:02:21 Lehane, Mendell T. ([id number]) -------------------------------------------------------------------------------- Arrival Information Details Patient Name: [name], Jamaine T. Date of Service: 10/05/2021 8:45 AM Medical Record Number: [id number] Patient Account Number: [id number] Date of Birth/Sex: 30-Apr-1940 (81 y.o. M) Treating RN: Carlene Coria Primary Care Finch Costanzo: Owens Loffler Other Clinician: Referring Zaeda Mcferran: Karl Ito Treating Ebrima Ranta/Extender: Tito Dine in Treatment: 0 Visit Information Patient Arrived: Ambulatory Arrival Time: 08:56 Accompanied By: wife Transfer Assistance: None Patient Identification Verified: Yes Secondary Verification Process Completed: Yes Patient Has Alerts: Yes Patient Alerts: Patient on Blood Thinner L ABI .56 04/22/21 R ABI .61 04/22/21 Electronic Signature(s) Signed: 10/05/2021 2:49:16 PM By: Carlene Coria RN Entered By: Carlene Coria on 10/05/2021 09:22:25 Sparrow, Abelino T. ([id number]) -------------------------------------------------------------------------------- Clinic Level of Care Assessment Details Patient Name: [name], Deland T. Date of Service: 10/05/2021 8:45 AM Medical Record Number: [id number] Patient Account Number: [id number] Date of Birth/Sex: [birth year] (81 y.o. M) Treating RN: Carlene Coria Primary Care Alece Koppel:  Owens Loffler Other Clinician: Referring Charlene Detter: Karl Ito Treating Monserrate Blaschke/Extender: Tito Dine in Treatment: 0 Clinic Level of Care Assessment Items TOOL 1 Quantity Score X - Use when EandM and Procedure is performed on INITIAL visit 1 0 ASSESSMENTS - Nursing Assessment / Reassessment X - General Physical Exam (combine w/ comprehensive assessment (listed just below) when performed on new 1 20 pt. evals) X- 1 25 Comprehensive Assessment (HX, ROS, Risk Assessments, Wounds Hx, etc.) ASSESSMENTS - Wound and Skin Assessment / Reassessment '[]'$  - Dermatologic / Skin Assessment (not related to wound area) 0 ASSESSMENTS - Ostomy and/or Continence Assessment and Care '[]'$  - Incontinence Assessment and Management 0 '[]'$  - 0 Ostomy Care Assessment and Management (repouching, etc.) PROCESS - Coordination of Care X - Simple Patient / Family Education for ongoing care 1 15 '[]'$  - 0 Complex (extensive) Patient / Family Education for ongoing care X- 1 10 Staff obtains Programmer, systems, Records, Test Results / Process Orders '[]'$  - 0 Staff telephones HHA, Nursing Homes / Clarify orders / etc '[]'$  - 0 Routine Transfer to another Facility (non-emergent condition) '[]'$  - 0 Routine Hospital Admission (non-emergent condition) X- 1 15 New Admissions / Biomedical engineer / Ordering NPWT, Apligraf, etc. '[]'$  - 0 Emergency Hospital Admission (emergent condition) PROCESS - Special Needs '[]'$  - Pediatric / Minor Patient Management 0 '[]'$  - 0 Isolation Patient Management '[]'$  - 0 Hearing / Language / Visual special needs '[]'$  - 0 Assessment of Community assistance (transportation, D/C planning, etc.) '[]'$  - 0 Additional assistance / Altered mentation '[]'$  - 0 Support Surface(s) Assessment (bed, cushion, seat, etc.) INTERVENTIONS - Miscellaneous '[]'$  - External ear exam 0 '[]'$  - 0 Patient Transfer (multiple staff / Civil Service fast streamer / Similar devices) '[]'$  - 0 Simple Staple / Suture removal (25 or less) '[]'$  -  0 Complex Staple / Suture removal (26 or more) '[]'$  - 0 Hypo/Hyperglycemic Management (do not check if billed separately) '[]'$  - 0 Ankle / Brachial Index (ABI) - do not check if billed separately Has the patient been seen at the hospital within the  last three years: Yes Total Score: 85 Level Of Care: New/Established - Level 3 Mccluskey, Trustin T. ([id number]) Electronic Signature(s) Signed: 10/05/2021 2:49:16 PM By: Carlene Coria RN Entered By: Carlene Coria on 10/05/2021 09:34:49 Alf, Darion T. ([id number]) -------------------------------------------------------------------------------- Encounter Discharge Information Details Patient Name: [name], Norvell T. Date of Service: 10/05/2021 8:45 AM Medical Record Number: [id number] Patient Account Number: [id number] Date of Birth/Sex: 1940/06/14 (81 y.o. M) Treating RN: Carlene Coria Primary Care Bradee Common: Owens Loffler Other Clinician: Referring Miosotis Wetsel: Karl Ito Treating Della Scrivener/Extender: Tito Dine in Treatment: 0 Encounter Discharge Information Items Post Procedure Vitals Discharge Condition: Stable Temperature (F): 97.6 Ambulatory Status: Ambulatory Pulse (bpm): 80 Discharge Destination: Home Respiratory Rate (breaths/min): 18 Transportation: Private Auto Blood Pressure (mmHg): 133/73 Accompanied By: wife Schedule Follow-up Appointment: Yes Clinical Summary of Care: Patient Declined Electronic Signature(s) Signed: 10/05/2021 2:49:16 PM By: Carlene Coria RN Entered By: Carlene Coria on 10/05/2021 09:35:55 Figuereo, Javonte T. ([id number]) -------------------------------------------------------------------------------- Lower Extremity Assessment Details Patient Name: Schertzer, Tyheim T. Date of Service: 10/05/2021 8:45 AM Medical Record Number: [id number] Patient Account Number: [id number] Date of Birth/Sex: 08-08-1940 (81 y.o. M) Treating RN: Carlene Coria Primary Care Vikki Gains: Owens Loffler Other Clinician: Referring  Coulter Oldaker: Karl Ito Treating Besan Ketchem/Extender: Tito Dine in Treatment: 0 Edema Assessment Assessed: [Left: No] [Right: No] Edema: [Left: Ye] [Right: s] Calf Left: Right: Point of Measurement: 36 cm From Medial Instep 35 cm Ankle Left: Right: Point of Measurement: 12 cm From Medial Instep 23 cm Knee To Floor Left: Right: From Medial Instep 46 cm Vascular Assessment Pulses: Dorsalis Pedis Palpable: [Left:Yes] Electronic Signature(s) Signed: 10/05/2021 2:49:16 PM By: Carlene Coria RN Entered By: Carlene Coria on 10/05/2021 09:21:41 Ureta, Ivar T. ([id number]) -------------------------------------------------------------------------------- Multi Wound Chart Details Patient Name: Vo, Jameir T. Date of Service: 10/05/2021 8:45 AM Medical Record Number: [id number] Patient Account Number: [id number] Date of Birth/Sex: 1940/07/30 (81 y.o. M) Treating RN: Carlene Coria Primary Care Kip Cropp: Owens Loffler Other Clinician: Referring Shakenna Herrero: Karl Ito Treating Maxximus Gotay/Extender: Tito Dine in Treatment: 0 Vital Signs Height(in): 69 Pulse(bpm): 80 Weight(lbs): 174 Blood Pressure(mmHg): 133/73 Body Mass Index(BMI): 25.7 Temperature(F): 97.6 Respiratory Rate(breaths/min): 18 Photos: [N/A:N/A] Wound Location: Left, Medial Ankle N/A N/A Wounding Event: Gradually Appeared N/A N/A Primary Etiology: Venous Leg Ulcer N/A N/A Comorbid History: Coronary Artery Disease, Deep Vein N/A N/A Thrombosis, Hypertension, Peripheral Arterial Disease Date Acquired: 08/23/2021 N/A N/A Weeks of Treatment: 0 N/A N/A Wound Status: Open N/A N/A Wound Recurrence: No N/A N/A Measurements L x W x D (cm) 0.5x0.5x0.1 N/A N/A Area (cm) : 0.196 N/A N/A Volume (cm) : 0.02 N/A N/A Classification: Full Thickness Without Exposed N/A N/A Support Structures Exudate Amount: Medium N/A N/A Exudate Type: Serosanguineous N/A N/A Exudate Color: red, brown N/A N/A Granulation  Amount: Medium (34-66%) N/A N/A Granulation Quality: Red N/A N/A Necrotic Amount: Medium (34-66%) N/A N/A Exposed Structures: Fat Layer (Subcutaneous Tissue): N/A N/A Yes Fascia: No Tendon: No Muscle: No Joint: No Bone: No Epithelialization: None N/A N/A Treatment Notes Electronic Signature(s) Signed: 10/05/2021 2:49:16 PM By: Carlene Coria RN Entered By: Carlene Coria on 10/05/2021 09:29:20 Leisner, Dex T. ([id number]) -------------------------------------------------------------------------------- Multi-Disciplinary Care Plan Details Patient Name: [name], Mcadoo T. Date of Service: 10/05/2021 8:45 AM Medical Record Number: [id number] Patient Account Number: [id number] Date of Birth/Sex: April 15, 1941 (81 y.o. M) Treating RN: Carlene Coria Primary Care Euell Schiff: Owens Loffler Other Clinician: Referring Abisai Deer: Karl Ito Treating Shahed Yeoman/Extender: Tito Dine in Treatment: 0 Active Inactive Wound/Skin Impairment Nursing Diagnoses: Knowledge deficit related  to ulceration/compromised skin integrity Goals: Patient/caregiver will verbalize understanding of skin care regimen Date Initiated: 10/05/2021 Target Resolution Date: 11/04/2021 Goal Status: Active Ulcer/skin breakdown will have a volume reduction of 30% by week 4 Date Initiated: 10/05/2021 Target Resolution Date: 11/04/2021 Goal Status: Active Ulcer/skin breakdown will have a volume reduction of 50% by week 8 Date Initiated: 10/05/2021 Target Resolution Date: 12/05/2021 Goal Status: Active Ulcer/skin breakdown will have a volume reduction of 80% by week 12 Date Initiated: 10/05/2021 Target Resolution Date: 01/05/2022 Goal Status: Active Ulcer/skin breakdown will heal within 14 weeks Date Initiated: 10/05/2021 Target Resolution Date: 02/04/2022 Goal Status: Active Interventions: Assess patient/caregiver ability to obtain necessary supplies Assess patient/caregiver ability to perform ulcer/skin care regimen  upon admission and as needed Assess ulceration(s) every visit Notes: Electronic Signature(s) Signed: 10/05/2021 2:49:16 PM By: Carlene Coria RN Entered By: Carlene Coria on 10/05/2021 09:28:57 Deshazer, Jori T. ([id number]) -------------------------------------------------------------------------------- Pain Assessment Details Patient Name: Old Bethpage, Tanor T. Date of Service: 10/05/2021 8:45 AM Medical Record Number: [id number] Patient Account Number: [id number] Date of Birth/Sex: 1940/12/17 (81 y.o. M) Treating RN: Carlene Coria Primary Care Symphonie Schneiderman: Owens Loffler Other Clinician: Referring Amilee Janvier: Karl Ito Treating Derec Mozingo/Extender: Tito Dine in Treatment: 0 Active Problems Location of Pain Severity and Description of Pain Patient Has Paino No Site Locations Pain Management and Medication Current Pain Management: Electronic Signature(s) Signed: 10/05/2021 2:49:16 PM By: Carlene Coria RN Entered By: Carlene Coria on 10/05/2021 08:57:50 Hice, Amed T. ([id number]) -------------------------------------------------------------------------------- Patient/Caregiver Education Details Patient Name: [name], Izael T. Date of Service: 10/05/2021 8:45 AM Medical Record Number: [id number] Patient Account Number: [id number] Date of Birth/Gender: 09-Sep-1940 (81 y.o. M) Treating RN: Carlene Coria Primary Care Physician: Owens Loffler Other Clinician: Referring Physician: Karl Ito Treating Physician/Extender: Tito Dine in Treatment: 0 Education Assessment Education Provided To: Patient Education Topics Provided Wound/Skin Impairment: Methods: Explain/Verbal Responses: State content correctly Electronic Signature(s) Signed: 10/05/2021 2:49:16 PM By: Carlene Coria RN Entered By: Carlene Coria on 10/05/2021 09:35:03 Phagan, Dreux T. ([id number]) -------------------------------------------------------------------------------- Wound Assessment Details Patient  Name: Soza, Miloh T. Date of Service: 10/05/2021 8:45 AM Medical Record Number: [id number] Patient Account Number: [id number] Date of Birth/Sex: [birth year] (81 y.o. M) Treating RN: Carlene Coria Primary Care Modean Mccullum: Owens Loffler Other Clinician: Referring Cressida Milford: Karl Ito Treating Taziah Difatta/Extender: Tito Dine in Treatment: 0 Wound Status Wound Number: 1 Primary Venous Leg Ulcer Etiology: Wound Location: Left, Medial Ankle Wound Open Wounding Event: Gradually Appeared Status: Date Acquired: 08/23/2021 Comorbid Coronary Artery Disease, Deep Vein Thrombosis, Weeks Of Treatment: 0 History: Hypertension, Peripheral Arterial Disease Clustered Wound: No Photos Wound Measurements Length: (cm) 0.5 Width: (cm) 0.5 Depth: (cm) 0.1 Area: (cm) 0.196 Volume: (cm) 0.02 % Reduction in Area: % Reduction in Volume: Epithelialization: None Tunneling: No Wound Description Classification: Full Thickness Without Exposed Support Structures Exudate Amount: Medium Exudate Type: Serosanguineous Exudate Color: red, brown Foul Odor After Cleansing: No Slough/Fibrino Yes Wound Bed Granulation Amount: Medium (34-66%) Exposed Structure Granulation Quality: Red Fascia Exposed: No Necrotic Amount: Medium (34-66%) Fat Layer (Subcutaneous Tissue) Exposed: Yes Necrotic Quality: Adherent Slough Tendon Exposed: No Muscle Exposed: No Joint Exposed: No Bone Exposed: No Treatment Notes Wound #1 (Ankle) Wound Laterality: Left, Medial Cleanser Peri-Wound Care Topical Primary Dressing Gutt, Nhia TMarland Kitchen ([id number]) Hydrofera Blue Ready Transfer Foam, 2.5x2.5 (in/in) Discharge Instruction: Apply Hydrofera Blue Ready to wound bed as directed Secondary Dressing Kerlix 4.5 x 4.1 (in/yd) Discharge Instruction: Apply Kerlix 4.5 x 4.1 (in/yd) as instructed Secured With Coban Cohesive Bandage 4x5 (yds)  Stretched Discharge Instruction: Apply Coban as directed. Compression  Wrap Compression Stockings Add-Ons Electronic Signature(s) Signed: 10/05/2021 2:49:16 PM By: Carlene Coria RN Entered By: Carlene Coria on 10/05/2021 09:18:56 Comley, Jamison T. ([id number]) -------------------------------------------------------------------------------- Vitals Details Patient Name: [name], Kris T. Date of Service: 10/05/2021 8:45 AM Medical Record Number: [id number] Patient Account Number: [id number] Date of Birth/Sex: 20-Aug-1940 (81 y.o. M) Treating RN: Carlene Coria Primary Care Eulalio Reamy: Owens Loffler Other Clinician: Referring Jettie Mannor: Karl Ito Treating Kamori Barbier/Extender: Tito Dine in Treatment: 0 Vital Signs Time Taken: 08:57 Temperature (F): 97.6 Height (in): 69 Pulse (bpm): 80 Source: Stated Respiratory Rate (breaths/min): 18 Weight (lbs): 174 Blood Pressure (mmHg): 133/73 Source: Stated Reference Range: 80 - 120 mg / dl Body Mass Index (BMI): 25.7 Electronic Signature(s) Signed: 10/05/2021 2:49:16 PM By: Carlene Coria RN Entered By: Carlene Coria on 10/05/2021 08:59:27

## 2021-10-06 NOTE — Progress Notes (Signed)
ACY, ORSAK ([id number]) Visit Report for 10/05/2021 Chief Complaint Document Details Patient Name: [name], [name] Date of Service: 10/05/2021 8:45 AM Medical Record Number: [id number] Patient Account Number: [id number] Date of Birth/Sex: 25-Feb-1941 (81 y.o. M) Treating RN: Carlene Coria Primary Care Provider: Owens Loffler Other Clinician: Referring Provider: Karl Ito Treating Provider/Extender: Tito Dine in Treatment: 0 Information Obtained from: Patient Chief Complaint 10/05/2021; patient is here for review of a wound on the left medial ankle Electronic Signature(s) Signed: 10/06/2021 11:05:19 AM By: Linton Ham MD Entered By: Linton Ham on 10/05/2021 09:46:55 [name], [name] ([id number]) -------------------------------------------------------------------------------- Debridement Details Patient Name: [name], [name] Date of Service: 10/05/2021 8:45 AM Medical Record Number: [id number] Patient Account Number: [id number] Date of Birth/Sex: 05/12/1940 (81 y.o. M) Treating RN: Carlene Coria Primary Care Provider: Owens Loffler Other Clinician: Referring Provider: Karl Ito Treating Provider/Extender: Tito Dine in Treatment: 0 Debridement Performed for Wound #1 Left,Medial Ankle Assessment: Performed By: Physician Ricard Dillon, MD Debridement Type: Debridement Severity of Tissue Pre Debridement: Fat layer exposed Level of Consciousness (Pre- Awake and Alert procedure): Pre-procedure Verification/Time Out Yes - 09:30 Taken: Start Time: 09:30 Pain Control: Lidocaine 4% Topical Solution Total Area Debrided (L x W): 0.5 (cm) x 0.5 (cm) = 0.25 (cm) Tissue and other material Viable, Non-Viable, Slough, Subcutaneous, Skin: Dermis , Skin: Epidermis, Slough debrided: Level: Skin/Subcutaneous Tissue Debridement Description: Excisional Instrument: Curette Bleeding: Minimum Hemostasis Achieved: Pressure End Time:  09:34 Procedural Pain: 0 Post Procedural Pain: 0 Response to Treatment: Procedure was tolerated well Level of Consciousness (Post- Awake and Alert procedure): Post Debridement Measurements of Total Wound Length: (cm) 0.5 Width: (cm) 0.5 Depth: (cm) 0.1 Volume: (cm) 0.02 Character of Wound/Ulcer Post Debridement: Improved Severity of Tissue Post Debridement: Fat layer exposed Post Procedure Diagnosis Same as Pre-procedure Electronic Signature(s) Signed: 10/05/2021 2:49:16 PM By: Carlene Coria RN Signed: 10/06/2021 11:05:19 AM By: Linton Ham MD Entered By: Linton Ham on 10/05/2021 09:55:21 [name], [name] ([id number]) -------------------------------------------------------------------------------- HPI Details Patient Name: [name], [name] Date of Service: 10/05/2021 8:45 AM Medical Record Number: [id number] Patient Account Number: [id number] Date of Birth/Sex: February 19, 1941 (81 y.o. M) Treating RN: Carlene Coria Primary Care Provider: Owens Loffler Other Clinician: Referring Provider: Karl Ito Treating Provider/Extender: Tito Dine in Treatment: 0 History of Present Illness HPI Description: ADMISSION 10/05/2021 This is an 81 year old man who comes to see [place] for a wound on his left medial ankle that is been there for about a month. He has not been dressing this specifically. The patient is known to have both chronic venous insufficiency and severe arterial insufficiency. For the latter he follows with Dr. Fletcher Anon. He has had extensive stenting including bilateral common iliac arteries, left renal artery and stenting in the left upper extremity. He has a remote smoking history and a lot of medical comorbidities but he is not a diabetic. He tells me that the wound on his left medial ankle started about a month ago or he noticed it about a month ago there was no trauma no bleeding no infection that he is aware of not able to tell me exactly how this would have  started. Finds it to be quite painful Past medical history is really quite extensive and includes ischemic cardiomyopathy, COPD, peripheral arterial disease, renal artery stenosis, history of a DVT, chronic kidney disease stage IIIb, hypertension, chronic lower extremity edema, diastolic heart failure. His last arteriogram was in January of this year. He was not felt to have  any good endovascular options and he is not felt to be a good surgical candidate. He takes cilostazol for the intermittent claudication. Last noninvasive studies were in December 2022 at which time his ABI in the right was 0.61 with dampened monophasic wounds. They could not obtain a great toe pressure on the left ABI at 0.56 with a TBI of 0.23 dampened monophasic waveforms Electronic Signature(s) Signed: 10/06/2021 11:05:19 AM By: Linton Ham MD Entered By: Linton Ham on 10/05/2021 09:51:13 Lavallie, [name] ([id number]) -------------------------------------------------------------------------------- Physical Exam Details Patient Name: [name], [name] Date of Service: 10/05/2021 8:45 AM Medical Record Number: [id number] Patient Account Number: [id number] Date of Birth/Sex: 1940/10/01 (81 y.o. M) Treating RN: Carlene Coria Primary Care Provider: Owens Loffler Other Clinician: Referring Provider: Karl Ito Treating Provider/Extender: Tito Dine in Treatment: 0 Constitutional Sitting or standing Blood Pressure is within target range for patient.. Pulse regular and within target range for patient.Marland Kitchen Respirations regular, non- labored and within target range.. Temperature is normal and within the target range for the patient.Marland Kitchen appears in no distress. Respiratory Respiratory effort is easy and symmetric bilaterally. Rate is normal at rest and on room air.. Cardiovascular Dorsalis pedis pulse on the left was strangely palpable. Posterior tibial not palpable palpable popliteal was not palpable. He has  evidence of severe venous hypertension. Pitting edema around the small wound on his left ankle probably related to this.. Notes Wound exam; left medial ankle small punched out area just below the medial malleolus. I used a #3 curette to remove debris on the surface this cleans up quite nicely there was no bleeding. He does not tolerate this very well. Electronic Signature(s) Signed: 10/06/2021 11:05:19 AM By: Linton Ham MD Entered By: Linton Ham on 10/05/2021 09:52:54 [name], [name] ([id number]) -------------------------------------------------------------------------------- Physician Orders Details Patient Name: [name], [name] Date of Service: 10/05/2021 8:45 AM Medical Record Number: [id number] Patient Account Number: [id number] Date of Birth/Sex: 1940-08-03 (81 y.o. M) Treating RN: Carlene Coria Primary Care Provider: Owens Loffler Other Clinician: Referring Provider: Karl Ito Treating Provider/Extender: Tito Dine in Treatment: 0 Verbal / Phone Orders: No Diagnosis Coding Follow-up Appointments o Return Appointment in 1 week. Bathing/ Shower/ Hygiene o May shower; gently cleanse wound with antibacterial soap, rinse and pat dry prior to dressing wounds Edema Control - Lymphedema / Segmental Compressive Device / Other o Elevate, Exercise Daily and Avoid Standing for Long Periods of Time. o Elevate legs to the level of the heart and pump ankles as often as possible o Elevate leg(s) parallel to the floor when sitting. Wound Treatment Wound #1 - Ankle Wound Laterality: Left, Medial Primary Dressing: Hydrofera Blue Ready Transfer Foam, 2.5x2.5 (in/in) 1 x Per Week/30 Days Discharge Instructions: Apply Hydrofera Blue Ready to wound bed as directed Secondary Dressing: Kerlix 4.5 x 4.1 (in/yd) 1 x Per Week/30 Days Discharge Instructions: Apply Kerlix 4.5 x 4.1 (in/yd) as instructed Secured With: Coban Cohesive Bandage 4x5 (yds) Stretched 1 x Per  Week/30 Days Discharge Instructions: Apply Coban as directed. Electronic Signature(s) Signed: 10/05/2021 2:49:16 PM By: Carlene Coria RN Signed: 10/06/2021 11:05:19 AM By: Linton Ham MD Entered By: Carlene Coria on 10/05/2021 09:33:13 [name], [name] ([id number]) -------------------------------------------------------------------------------- Problem List Details Patient Name: [name], [name] Date of Service: 10/05/2021 8:45 AM Medical Record Number: [id number] Patient Account Number: [id number] Date of Birth/Sex: [birth year] (81 y.o. M) Treating RN: Carlene Coria Primary Care Provider: Owens Loffler Other Clinician: Referring Provider: Karl Ito Treating Provider/Extender: Tito Dine in Treatment: 0  Active Problems ICD-10 Encounter Code Description Active Date MDM Diagnosis I87.332 Chronic venous hypertension (idiopathic) with ulcer and inflammation of 10/05/2021 No Yes left lower extremity L97.328 Non-pressure chronic ulcer of left ankle with other specified severity 10/05/2021 No Yes I70.212 Atherosclerosis of native arteries of extremities with intermittent 10/05/2021 No Yes claudication, left leg Inactive Problems Resolved Problems Electronic Signature(s) Signed: 10/06/2021 11:05:19 AM By: Linton Ham MD Entered By: Linton Ham on 10/05/2021 09:39:22 [name], [name] ([id number]) -------------------------------------------------------------------------------- Progress Note Details Patient Name: [name], [name] Date of Service: 10/05/2021 8:45 AM Medical Record Number: [id number] Patient Account Number: [id number] Date of Birth/Sex: 1941-03-22 (81 y.o. M) Treating RN: Carlene Coria Primary Care Provider: Owens Loffler Other Clinician: Referring Provider: Karl Ito Treating Provider/Extender: Tito Dine in Treatment: 0 Subjective Chief Complaint Information obtained from Patient 10/05/2021; patient is here for review of a wound on the  left medial ankle History of Present Illness (HPI) ADMISSION 10/05/2021 This is an 81 year old man who comes to see [place] for a wound on his left medial ankle that is been there for about a month. He has not been dressing this specifically. The patient is known to have both chronic venous insufficiency and severe arterial insufficiency. For the latter he follows with Dr. Fletcher Anon. He has had extensive stenting including bilateral common iliac arteries, left renal artery and stenting in the left upper extremity. He has a remote smoking history and a lot of medical comorbidities but he is not a diabetic. He tells me that the wound on his left medial ankle started about a month ago or he noticed it about a month ago there was no trauma no bleeding no infection that he is aware of not able to tell me exactly how this would have started. Finds it to be quite painful Past medical history is really quite extensive and includes ischemic cardiomyopathy, COPD, peripheral arterial disease, renal artery stenosis, history of a DVT, chronic kidney disease stage IIIb, hypertension, chronic lower extremity edema, diastolic heart failure. His last arteriogram was in January of this year. He was not felt to have any good endovascular options and he is not felt to be a good surgical candidate. He takes cilostazol for the intermittent claudication. Last noninvasive studies were in December 2022 at which time his ABI in the right was 0.61 with dampened monophasic wounds. They could not obtain a great toe pressure on the left ABI at 0.56 with a TBI of 0.23 dampened monophasic waveforms Patient History Allergies Plavix Social History Former smoker, Marital Status - Married, Alcohol Use - Never, Drug Use - No History, Caffeine Use - Daily. Medical History Cardiovascular Patient has history of Coronary Artery Disease, Deep Vein Thrombosis, Hypertension, Peripheral Arterial Disease Review of Systems (ROS) Integumentary  (Skin) Complains or has symptoms of Wounds. Objective Constitutional Sitting or standing Blood Pressure is within target range for patient.. Pulse regular and within target range for patient.Marland Kitchen Respirations regular, non- labored and within target range.. Temperature is normal and within the target range for the patient.Marland Kitchen appears in no distress. Vitals Time Taken: 8:57 AM, Height: 69 in, Source: Stated, Weight: 174 lbs, Source: Stated, BMI: 25.7, Temperature: 97.6 F, Pulse: 80 bpm, Respiratory Rate: 18 breaths/min, Blood Pressure: 133/73 mmHg. Respiratory Respiratory effort is easy and symmetric bilaterally. Rate is normal at rest and on room air.. Cardiovascular Fenderson, Argelio T. ([id number]) Dorsalis pedis pulse on the left was strangely palpable. Posterior tibial not palpable palpable popliteal was not palpable. He has evidence of severe  venous hypertension. Pitting edema around the small wound on his left ankle probably related to this.. General Notes: Wound exam; left medial ankle small punched out area just below the medial malleolus. I used a #3 curette to remove debris on the surface this cleans up quite nicely there was no bleeding. He does not tolerate this very well. Integumentary (Hair, Skin) Wound #1 status is Open. Original cause of wound was Gradually Appeared. The date acquired was: 08/23/2021. The wound is located on the Left,Medial Ankle. The wound measures 0.5cm length x 0.5cm width x 0.1cm depth; 0.196cm^2 area and 0.02cm^3 volume. There is Fat Layer (Subcutaneous Tissue) exposed. There is no tunneling noted. There is a medium amount of serosanguineous drainage noted. There is medium (34-66%) red granulation within the wound bed. There is a medium (34-66%) amount of necrotic tissue within the wound bed including Adherent Slough. Assessment Active Problems ICD-10 Chronic venous hypertension (idiopathic) with ulcer and inflammation of left lower extremity Non-pressure chronic  ulcer of left ankle with other specified severity Atherosclerosis of native arteries of extremities with intermittent claudication, left leg Procedures Wound #1 Pre-procedure diagnosis of Wound #1 is a Venous Leg Ulcer located on the Left,Medial Ankle .Severity of Tissue Pre Debridement is: Fat layer exposed. There was a Excisional Skin/Subcutaneous Tissue Debridement with a total area of 0.25 sq cm performed by Ricard Dillon, MD. With the following instrument(s): Curette to remove Viable and Non-Viable tissue/material. Material removed includes Subcutaneous Tissue, Slough, Skin: Dermis, and Skin: Epidermis after achieving pain control using Lidocaine 4% Topical Solution. No specimens were taken. A time out was conducted at 09:30, prior to the start of the procedure. A Minimum amount of bleeding was controlled with Pressure. The procedure was tolerated well with a pain level of 0 throughout and a pain level of 0 following the procedure. Post Debridement Measurements: 0.5cm length x 0.5cm width x 0.1cm depth; 0.02cm^3 volume. Character of Wound/Ulcer Post Debridement is improved. Severity of Tissue Post Debridement is: Fat layer exposed. Post procedure Diagnosis Wound #1: Same as Pre-Procedure Plan Follow-up Appointments: Return Appointment in 1 week. Bathing/ Shower/ Hygiene: May shower; gently cleanse wound with antibacterial soap, rinse and pat dry prior to dressing wounds Edema Control - Lymphedema / Segmental Compressive Device / Other: Elevate, Exercise Daily and Avoid Standing for Long Periods of Time. Elevate legs to the level of the heart and pump ankles as often as possible Elevate leg(s) parallel to the floor when sitting. WOUND #1: - Ankle Wound Laterality: Left, Medial Primary Dressing: Hydrofera Blue Ready Transfer Foam, 2.5x2.5 (in/in) 1 x Per Week/30 Days Discharge Instructions: Apply Hydrofera Blue Ready to wound bed as directed Secondary Dressing: Kerlix 4.5 x 4.1  (in/yd) 1 x Per Week/30 Days Discharge Instructions: Apply Kerlix 4.5 x 4.1 (in/yd) as instructed Secured With: Coban Cohesive Bandage 4x5 (yds) Stretched 1 x Per Week/30 Days Discharge Instructions: Apply Coban as directed. 1. We used Hydrofera Blue under kerlix Coban. 2. Going to more aggressive compression might prove risky to his known PAD although I do not see a good description of the anatomy below the left knee 3. He has claudication including some times where pain wakes him up at night although this does not seem to be limiting in his lifestyle Lwin, Lamarcus T. ([id number]) 4. Hopefully with controlling the edema around the wound the small area will epithelialize over. 5. No evidence of infection Electronic Signature(s) Signed: 10/06/2021 11:05:19 AM By: Linton Ham MD Entered By: Linton Ham on 10/05/2021 09:54:36  Guzzo, Rogen TMarland Kitchen ([id number]) -------------------------------------------------------------------------------- ROS/PFSH Details Patient Name: Heinecke, Jerek T. Date of Service: 10/05/2021 8:45 AM Medical Record Number: [id number] Patient Account Number: [id number] Date of Birth/Sex: March 11, 1941 (81 y.o. M) Treating RN: Carlene Coria Primary Care Provider: Owens Loffler Other Clinician: Referring Provider: Karl Ito Treating Provider/Extender: Tito Dine in Treatment: 0 Integumentary (Skin) Complaints and Symptoms: Positive for: Wounds Cardiovascular Medical History: Positive for: Coronary Artery Disease; Deep Vein Thrombosis; Hypertension; Peripheral Arterial Disease Immunizations Pneumococcal Vaccine: Received Pneumococcal Vaccination: Yes Received Pneumococcal Vaccination On or After 60th Birthday: Yes Implantable Devices No devices added Family and Social History Former smoker; Marital Status - Married; Alcohol Use: Never; Drug Use: No History; Caffeine Use: Daily Electronic Signature(s) Signed: 10/05/2021 2:49:16 PM By: Carlene Coria  RN Signed: 10/06/2021 11:05:19 AM By: Linton Ham MD Entered By: Carlene Coria on 10/05/2021 09:10:02 [place], Jorel T. ([id number]) -------------------------------------------------------------------------------- SuperBill Details Patient Name: [name], Meko T. Date of Service: 10/05/2021 Medical Record Number: [id number] Patient Account Number: [id number] Date of Birth/Sex: 08/19/1940 (81 y.o. M) Treating RN: Carlene Coria Primary Care Provider: Owens Loffler Other Clinician: Referring Provider: Karl Ito Treating Provider/Extender: Tito Dine in Treatment: 0 Diagnosis Coding ICD-10 Codes Code Description [phone] Chronic venous hypertension (idiopathic) with ulcer and inflammation of left lower extremity L97.328 Non-pressure chronic ulcer of left ankle with other specified severity I70.212 Atherosclerosis of native arteries of extremities with intermittent claudication, left leg Facility Procedures CPT4 Code Description: [id number] 99213 - WOUND CARE VISIT-LEV 3 EST PT Modifier: Quantity: 1 CPT4 Code Description: [id number] 11042 - DEB SUBQ TISSUE 20 SQ CM/< Modifier: Quantity: 1 CPT4 Code Description: ICD-10 Diagnosis Description I87.332 Chronic venous hypertension (idiopathic) with ulcer and inflammation of left Modifier: lower extremity Quantity: Physician Procedures CPT4 Code Description: [id number] WC PHYS LEVEL 3 o NEW PT Modifier: 25 Quantity: 1 CPT4 Code Description: ICD-10 Diagnosis Description I87.332 Chronic venous hypertension (idiopathic) with ulcer and inflammation of left L97.328 Non-pressure chronic ulcer of left ankle with other specified severity I70.212 Atherosclerosis of native  arteries of extremities with intermittent claudica Modifier: lower extremity tion, left leg Quantity: CPT4 Code Description: [id number] 09323 - WC PHYS SUBQ TISS 20 SQ CM Modifier: Quantity: 1 CPT4 Code Description: ICD-10 Diagnosis Description I87.332 Chronic venous  hypertension (idiopathic) with ulcer and inflammation of left Modifier: lower extremity Quantity: Electronic Signature(s) Signed: 10/06/2021 11:05:19 AM By: Linton Ham MD Entered By: Linton Ham on 10/05/2021 09:54:59

## 2021-10-12 ENCOUNTER — Encounter: Payer: Medicare Other | Admitting: Physician Assistant

## 2021-10-12 DIAGNOSIS — J449 Chronic obstructive pulmonary disease, unspecified: Secondary | ICD-10-CM | POA: Diagnosis not present

## 2021-10-12 DIAGNOSIS — I87332 Chronic venous hypertension (idiopathic) with ulcer and inflammation of left lower extremity: Secondary | ICD-10-CM | POA: Diagnosis not present

## 2021-10-12 DIAGNOSIS — N1832 Chronic kidney disease, stage 3b: Secondary | ICD-10-CM | POA: Diagnosis not present

## 2021-10-12 DIAGNOSIS — L97322 Non-pressure chronic ulcer of left ankle with fat layer exposed: Secondary | ICD-10-CM | POA: Diagnosis not present

## 2021-10-12 DIAGNOSIS — L97328 Non-pressure chronic ulcer of left ankle with other specified severity: Secondary | ICD-10-CM | POA: Diagnosis not present

## 2021-10-12 DIAGNOSIS — I130 Hypertensive heart and chronic kidney disease with heart failure and stage 1 through stage 4 chronic kidney disease, or unspecified chronic kidney disease: Secondary | ICD-10-CM | POA: Diagnosis not present

## 2021-10-12 DIAGNOSIS — I70212 Atherosclerosis of native arteries of extremities with intermittent claudication, left leg: Secondary | ICD-10-CM | POA: Diagnosis not present

## 2021-10-13 NOTE — Progress Notes (Signed)
EVELIO, RUEDA ([id number]) Visit Report for 10/12/2021 Chief Complaint Document Details Patient Name: [name], [name] Date of Service: 10/12/2021 8:00 AM Medical Record Number: [id number] Patient Account Number: [id number] Date of Birth/Sex: 1941-04-02 (81 y.o. M) Treating RN: Carlene Coria Primary Care Provider: Owens Loffler Other Clinician: Referring Provider: Owens Loffler Treating Provider/Extender: Skipper Cliche in Treatment: 1 Information Obtained from: Patient Chief Complaint 10/05/2021; patient is here for review of a wound on the left medial ankle Electronic Signature(s) Signed: 10/13/2021 8:55:04 AM By: Worthy Keeler [name] Entered By: Worthy Keeler on 10/12/2021 08:12:18 [name], [name] ([id number]) -------------------------------------------------------------------------------- HPI Details Patient Name: [name], Taiwo T. Date of Service: 10/12/2021 8:00 AM Medical Record Number: [id number] Patient Account Number: [id number] Date of Birth/Sex: [birth year] (81 y.o. M) Treating RN: Carlene Coria Primary Care Provider: Owens Loffler Other Clinician: Referring Provider: Owens Loffler Treating Provider/Extender: Skipper Cliche in Treatment: 1 History of Present Illness HPI Description: ADMISSION 10/05/2021 This is an 81 year old man who comes to see [place] for a wound on his left medial ankle that is been there for about a month. He has not been dressing this specifically. The patient is known to have both chronic venous insufficiency and severe arterial insufficiency. For the latter he follows with Dr. Fletcher Anon. He has had extensive stenting including bilateral common iliac arteries, left renal artery and stenting in the left upper extremity. He has a remote smoking history and a lot of medical comorbidities but he is not a diabetic. He tells me that the wound on his left medial ankle started about a month ago or he noticed it about a month ago there was no trauma  no bleeding no infection that he is aware of not able to tell me exactly how this would have started. Finds it to be quite painful Past medical history is really quite extensive and includes ischemic cardiomyopathy, COPD, peripheral arterial disease, renal artery stenosis, history of a DVT, chronic kidney disease stage IIIb, hypertension, chronic lower extremity edema, diastolic heart failure. His last arteriogram was in January of this year. He was not felt to have any good endovascular options and he is not felt to be a good surgical candidate. He takes cilostazol for the intermittent claudication. Last noninvasive studies were in December 2022 at which time his ABI in the right was 0.61 with dampened monophasic wounds. They could not obtain a great toe pressure on the left ABI at 0.56 with a TBI of 0.23 dampened monophasic waveforms 10-12-2021 upon evaluation today patient appears to be doing well with regard to his wounds. He was seen by Dr. Dellia Nims last week. The good news is he is actually showing signs of improvement compared to last week and overall very pleased in that regard. Fortunately there does not appear to be any signs of active infection locally or systemically which is great news. No fevers, chills, nausea, vomiting, or diarrhea. Electronic Signature(s) Signed: 10/12/2021 8:41:33 AM By: Worthy Keeler [name] Entered By: Worthy Keeler on 10/12/2021 08:41:33 [name], [name] ([id number]) -------------------------------------------------------------------------------- Physical Exam Details Patient Name: [name], [name] Date of Service: 10/12/2021 8:00 AM Medical Record Number: [id number] Patient Account Number: [id number] Date of Birth/Sex: 1940/10/08 (81 y.o. M) Treating RN: Carlene Coria Primary Care Provider: Owens Loffler Other Clinician: Referring Provider: Owens Loffler Treating Provider/Extender: Skipper Cliche in Treatment: 1 Constitutional Well-nourished and  well-hydrated in no acute distress. Respiratory normal breathing without difficulty. Psychiatric this patient is able to make decisions and demonstrates good insight  into disease process. Alert and Oriented x 3. pleasant and cooperative. Notes Upon inspection patient's wound bed showed signs of good granulation and epithelization at this point. Fortunately I do not see any evidence of infection locally or systemically which is great news and overall I am extremely pleased with where we stand at this point today. Electronic Signature(s) Signed: 10/12/2021 8:41:50 AM By: Worthy Keeler [name] Entered By: Worthy Keeler on 10/12/2021 08:41:49 [name], [name] ([id number]) -------------------------------------------------------------------------------- Physician Orders Details Patient Name: [name], Nygel T. Date of Service: 10/12/2021 8:00 AM Medical Record Number: [id number] Patient Account Number: [id number] Date of Birth/Sex: 1940/07/02 (81 y.o. M) Treating RN: Carlene Coria Primary Care Provider: Owens Loffler Other Clinician: Referring Provider: Owens Loffler Treating Provider/Extender: Skipper Cliche in Treatment: 1 Verbal / Phone Orders: No Diagnosis Coding ICD-10 Coding Code Description [phone] Chronic venous hypertension (idiopathic) with ulcer and inflammation of left lower extremity L97.328 Non-pressure chronic ulcer of left ankle with other specified severity I70.212 Atherosclerosis of native arteries of extremities with intermittent claudication, left leg Follow-up Appointments o Return Appointment in 1 week. Bathing/ Shower/ Hygiene o May shower; gently cleanse wound with antibacterial soap, rinse and pat dry prior to dressing wounds Edema Control - Lymphedema / Segmental Compressive Device / Other o Elevate, Exercise Daily and Avoid Standing for Long Periods of Time. o Elevate legs to the level of the heart and pump ankles as often as possible o Elevate  leg(s) parallel to the floor when sitting. Wound Treatment Wound #1 - Ankle Wound Laterality: Left, Medial Topical: Gentamicin 1 x Per Week/30 Days Discharge Instructions: mix with TCA and apply to peri wound a nd wound bed Topical: Triamcinolone Acetonide Cream, 0.1%, 15 (g) tube 1 x Per Week/30 Days Discharge Instructions: mix with gentamycin and apply to peri wound a nd wound bed Primary Dressing: Hydrofera Blue Ready Transfer Foam, 2.5x2.5 (in/in) 1 x Per Week/30 Days Discharge Instructions: Apply Hydrofera Blue Ready to wound bed as directed Secondary Dressing: Kerlix 4.5 x 4.1 (in/yd) 1 x Per Week/30 Days Discharge Instructions: Apply Kerlix 4.5 x 4.1 (in/yd) as instructed Secured With: Coban Cohesive Bandage 4x5 (yds) Stretched 1 x Per Week/30 Days Discharge Instructions: Apply Coban as directed. Electronic Signature(s) Signed: 10/13/2021 8:55:04 AM By: Worthy Keeler [name] Signed: 10/13/2021 2:40:31 PM By: Carlene Coria RN Entered By: Carlene Coria on 10/12/2021 08:50:51 [name], [name] ([id number]) -------------------------------------------------------------------------------- Problem List Details Patient Name: [name], [name] Date of Service: 10/12/2021 8:00 AM Medical Record Number: [id number] Patient Account Number: [id number] Date of Birth/Sex: 05/07/1940 (81 y.o. M) Treating RN: Carlene Coria Primary Care Provider: Owens Loffler Other Clinician: Referring Provider: Owens Loffler Treating Provider/Extender: Skipper Cliche in Treatment: 1 Active Problems ICD-10 Encounter Code Description Active Date MDM Diagnosis I87.332 Chronic venous hypertension (idiopathic) with ulcer and inflammation of 10/05/2021 No Yes left lower extremity L97.328 Non-pressure chronic ulcer of left ankle with other specified severity 10/05/2021 No Yes I70.212 Atherosclerosis of native arteries of extremities with intermittent 10/05/2021 No Yes claudication, left leg Inactive Problems Resolved  Problems Electronic Signature(s) Signed: 10/13/2021 8:55:04 AM By: Worthy Keeler [name] Entered By: Worthy Keeler on 10/12/2021 08:12:12 [name], [name] ([id number]) -------------------------------------------------------------------------------- Progress Note Details Patient Name: [name], [name] Date of Service: 10/12/2021 8:00 AM Medical Record Number: [id number] Patient Account Number: [id number] Date of Birth/Sex: 27-Jun-1940 (81 y.o. M) Treating RN: Carlene Coria Primary Care Provider: Owens Loffler Other Clinician: Referring Provider: Owens Loffler Treating Provider/Extender: Skipper Cliche in Treatment: 1 Subjective  Chief Complaint Information obtained from Patient 10/05/2021; patient is here for review of a wound on the left medial ankle History of Present Illness (HPI) ADMISSION 10/05/2021 This is an 81 year old man who comes to see [place] for a wound on his left medial ankle that is been there for about a month. He has not been dressing this specifically. The patient is known to have both chronic venous insufficiency and severe arterial insufficiency. For the latter he follows with Dr. Fletcher Anon. He has had extensive stenting including bilateral common iliac arteries, left renal artery and stenting in the left upper extremity. He has a remote smoking history and a lot of medical comorbidities but he is not a diabetic. He tells me that the wound on his left medial ankle started about a month ago or he noticed it about a month ago there was no trauma no bleeding no infection that he is aware of not able to tell me exactly how this would have started. Finds it to be quite painful Past medical history is really quite extensive and includes ischemic cardiomyopathy, COPD, peripheral arterial disease, renal artery stenosis, history of a DVT, chronic kidney disease stage IIIb, hypertension, chronic lower extremity edema, diastolic heart failure. His last arteriogram was in January of  this year. He was not felt to have any good endovascular options and he is not felt to be a good surgical candidate. He takes cilostazol for the intermittent claudication. Last noninvasive studies were in December 2022 at which time his ABI in the right was 0.61 with dampened monophasic wounds. They could not obtain a great toe pressure on the left ABI at 0.56 with a TBI of 0.23 dampened monophasic waveforms 10-12-2021 upon evaluation today patient appears to be doing well with regard to his wounds. He was seen by Dr. Dellia Nims last week. The good news is he is actually showing signs of improvement compared to last week and overall very pleased in that regard. Fortunately there does not appear to be any signs of active infection locally or systemically which is great news. No fevers, chills, nausea, vomiting, or diarrhea. Objective Constitutional Well-nourished and well-hydrated in no acute distress. Vitals Time Taken: 8:10 AM, Height: 69 in, Weight: 174 lbs, BMI: 25.7, Temperature: 97.6 F, Pulse: 82 bpm, Respiratory Rate: 18 breaths/min, Blood Pressure: 124/75 mmHg. Respiratory normal breathing without difficulty. Psychiatric this patient is able to make decisions and demonstrates good insight into disease process. Alert and Oriented x 3. pleasant and cooperative. General Notes: Upon inspection patient's wound bed showed signs of good granulation and epithelization at this point. Fortunately I do not see any evidence of infection locally or systemically which is great news and overall I am extremely pleased with where we stand at this point today. Integumentary (Hair, Skin) Wound #1 status is Open. Original cause of wound was Gradually Appeared. The date acquired was: 08/23/2021. The wound has been in treatment 1 weeks. The wound is located on the Left,Medial Ankle. The wound measures 0.4cm length x 0.3cm width x 0.1cm depth; 0.094cm^2 area and 0.009cm^3 volume. There is Fat Layer (Subcutaneous  Tissue) exposed. There is no tunneling or undermining noted. There is a medium amount of serosanguineous drainage noted. There is medium (34-66%) red granulation within the wound bed. There is a medium (34-66%) amount of necrotic tissue within the wound bed including Adherent Slough. Massey, [name] Kitchen ([id number]) Assessment Active Problems ICD-10 Chronic venous hypertension (idiopathic) with ulcer and inflammation of left lower extremity Non-pressure chronic ulcer of left ankle  with other specified severity Atherosclerosis of native arteries of extremities with intermittent claudication, left leg Plan Follow-up Appointments: Return Appointment in 1 week. Bathing/ Shower/ Hygiene: May shower; gently cleanse wound with antibacterial soap, rinse and pat dry prior to dressing wounds Edema Control - Lymphedema / Segmental Compressive Device / Other: Elevate, Exercise Daily and Avoid Standing for Long Periods of Time. Elevate legs to the level of the heart and pump ankles as often as possible Elevate leg(s) parallel to the floor when sitting. WOUND #1: - Ankle Wound Laterality: Left, Medial Topical: Gentamicin 1 x Per Week/30 Days Discharge Instructions: Apply as directed by provider. Topical: Triamcinolone Acetonide Cream, 0.1%, 15 (g) tube 1 x Per Week/30 Days Discharge Instructions: Apply as directed by provider. Primary Dressing: Hydrofera Blue Ready Transfer Foam, 2.5x2.5 (in/in) 1 x Per Week/30 Days Discharge Instructions: Apply Hydrofera Blue Ready to wound bed as directed Secondary Dressing: Kerlix 4.5 x 4.1 (in/yd) 1 x Per Week/30 Days Discharge Instructions: Apply Kerlix 4.5 x 4.1 (in/yd) as instructed Secured With: Coban Cohesive Bandage 4x5 (yds) Stretched 1 x Per Week/30 Days Discharge Instructions: Apply Coban as directed. 1. I am going to suggest that we go ahead and continue with the wound care measures as before and the patient is in agreement the plan. This includes the use  of the [facility] although I would like to add a mixture of gentamicin with triamcinolone to the wound and the periwound area. 2. I am would recommend as well that we have the patient continue with the Curlex and Coban wrap which does seem to be sufficient for him. 3. I am also going to suggest that he should continue to wear his compression sock on his right leg and elevate his legs is much as possible. We will see patient back for reevaluation in 1 week here in the clinic. If anything worsens or changes patient will contact our office for additional recommendations. Electronic Signature(s) Signed: 10/12/2021 8:42:32 AM By: Worthy Keeler [name] Entered By: Worthy Keeler on 10/12/2021 08:42:32 [name], [name] ([id number]) -------------------------------------------------------------------------------- SuperBill Details Patient Name: [name], [name] Date of Service: 10/12/2021 Medical Record Number: [id number] Patient Account Number: [id number] Date of Birth/Sex: 1940/06/27 (81 y.o. M) Treating RN: Carlene Coria Primary Care Provider: Owens Loffler Other Clinician: Referring Provider: Owens Loffler Treating Provider/Extender: Skipper Cliche in Treatment: 1 Diagnosis Coding ICD-10 Codes Code Description [phone] Chronic venous hypertension (idiopathic) with ulcer and inflammation of left lower extremity L97.328 Non-pressure chronic ulcer of left ankle with other specified severity I70.212 Atherosclerosis of native arteries of extremities with intermittent claudication, left leg Facility Procedures CPT4 Code: [id number] Description: [phone] - WOUND CARE VISIT-LEV 2 EST PT Modifier: Quantity: 1 Physician Procedures CPT4 Code Description: [id number] - WC PHYS LEVEL 4 - EST PT Modifier: Quantity: 1 CPT4 Code Description: ICD-10 Diagnosis Description I87.332 Chronic venous hypertension (idiopathic) with ulcer and inflammation of left L97.328 Non-pressure chronic ulcer of left  ankle with other specified severity I70.212 Atherosclerosis of native  arteries of extremities with intermittent claudica Modifier: lower extremity tion, left leg Quantity: Electronic Signature(s) Signed: 10/12/2021 8:42:49 AM By: Worthy Keeler [name] Entered By: Worthy Keeler on 10/12/2021 08:42:49

## 2021-10-13 NOTE — Progress Notes (Signed)
[name] ([id number]) Visit Report for 10/12/2021 Arrival Information Details Patient Name: [name], [name]. Date of Service: 10/12/2021 8:00 AM Medical Record Number: [id number] Patient Account Number: [id number] Date of Birth/Sex: [birth year] (81 y.o. M) Treating RN: Carlene Coria Primary Care Tykeshia Tourangeau: Owens Loffler Other Clinician: Referring Lilyrose Tanney: Owens Loffler Treating Jefferie Holston/Extender: [name] in Treatment: 1 Visit Information History Since Last Visit All ordered tests and consults were completed: No Patient Arrived: Ambulatory Added or deleted any medications: No Arrival Time: 08:05 Any new allergies or adverse reactions: No Accompanied By: wife Had a fall or experienced change in No Transfer Assistance: None activities of daily living that may affect Patient Identification Verified: Yes risk of falls: Secondary Verification Process Completed: Yes Signs or symptoms of abuse/neglect since last visito No Patient Has Alerts: Yes Hospitalized since last visit: No Patient Alerts: Patient on Blood Thinner Implantable device outside of the clinic excluding No L ABI .56 04/22/21 cellular tissue based products placed in the center R ABI .61 04/22/21 since last visit: Has Dressing in Place as Prescribed: Yes Pain Present Now: Yes Electronic Signature(s) Signed: 10/13/2021 2:40:31 PM By: Carlene Coria RN Entered By: Carlene Coria on 10/12/2021 08:09:19 Navis, [name]. ([id number]) -------------------------------------------------------------------------------- Clinic Level of Care Assessment Details Patient Name: [name], [name]. Date of Service: 10/12/2021 8:00 AM Medical Record Number: [id number] Patient Account Number: [id number] Date of Birth/Sex: 08/31/1940 (81 y.o. M) Treating RN: Carlene Coria Primary Care Gabriell Casimir: Owens Loffler Other Clinician: Referring Ashlynn Gunnels: Owens Loffler Treating [name]: [name] in Treatment:  1 Clinic Level of Care Assessment Items TOOL 4 Quantity Score X - Use when only an EandM is performed on FOLLOW-UP visit 1 0 ASSESSMENTS - Nursing Assessment / Reassessment '[]'$  - Reassessment of Co-morbidities (includes updates in patient status) 0 '[]'$  - 0 Reassessment of Adherence to Treatment Plan ASSESSMENTS - Wound and Skin Assessment / Reassessment X - Simple Wound Assessment / Reassessment - one wound 1 5 '[]'$  - 0 Complex Wound Assessment / Reassessment - multiple wounds '[]'$  - 0 Dermatologic / Skin Assessment (not related to wound area) ASSESSMENTS - Focused Assessment '[]'$  - Circumferential Edema Measurements - multi extremities 0 '[]'$  - 0 Nutritional Assessment / Counseling / Intervention '[]'$  - 0 Lower Extremity Assessment (monofilament, tuning fork, pulses) '[]'$  - 0 Peripheral Arterial Disease Assessment (using hand held doppler) ASSESSMENTS - Ostomy and/or Continence Assessment and Care '[]'$  - Incontinence Assessment and Management 0 '[]'$  - 0 Ostomy Care Assessment and Management (repouching, etc.) PROCESS - Coordination of Care X - Simple Patient / Family Education for ongoing care 1 15 '[]'$  - 0 Complex (extensive) Patient / Family Education for ongoing care '[]'$  - 0 Staff obtains Programmer, systems, Records, Test Results / Process Orders '[]'$  - 0 Staff telephones HHA, Nursing Homes / Clarify orders / etc '[]'$  - 0 Routine Transfer to another Facility (non-emergent condition) '[]'$  - 0 Routine Hospital Admission (non-emergent condition) '[]'$  - 0 New Admissions / Biomedical engineer / Ordering NPWT, Apligraf, etc. '[]'$  - 0 Emergency Hospital Admission (emergent condition) X- 1 10 Simple Discharge Coordination '[]'$  - 0 Complex (extensive) Discharge Coordination PROCESS - Special Needs '[]'$  - Pediatric / Minor Patient Management 0 '[]'$  - 0 Isolation Patient Management '[]'$  - 0 Hearing / Language / Visual special needs '[]'$  - 0 Assessment of Community assistance (transportation, D/C planning, etc.) '[]'$   - 0 Additional assistance / Altered mentation '[]'$  - 0 Support Surface(s) Assessment (bed, cushion, seat, etc.) INTERVENTIONS - Wound Cleansing / Measurement [name]. (  [id number]) X- 1 5 Simple Wound Cleansing - one wound '[]'$  - 0 Complex Wound Cleansing - multiple wounds X- 1 5 Wound Imaging (photographs - any number of wounds) '[]'$  - 0 Wound Tracing (instead of photographs) X- 1 5 Simple Wound Measurement - one wound '[]'$  - 0 Complex Wound Measurement - multiple wounds INTERVENTIONS - Wound Dressings X - Small Wound Dressing one or multiple wounds 1 10 '[]'$  - 0 Medium Wound Dressing one or multiple wounds '[]'$  - 0 Large Wound Dressing one or multiple wounds '[]'$  - 0 Application of Medications - topical '[]'$  - 0 Application of Medications - injection INTERVENTIONS - Miscellaneous '[]'$  - External ear exam 0 '[]'$  - 0 Specimen Collection (cultures, biopsies, blood, body fluids, etc.) '[]'$  - 0 Specimen(s) / Culture(s) sent or taken to Lab for analysis '[]'$  - 0 Patient Transfer (multiple staff / Civil Service fast streamer / Similar devices) '[]'$  - 0 Simple Staple / Suture removal (25 or less) '[]'$  - 0 Complex Staple / Suture removal (26 or more) '[]'$  - 0 Hypo / Hyperglycemic Management (close monitor of Blood Glucose) '[]'$  - 0 Ankle / Brachial Index (ABI) - do not check if billed separately X- 1 5 Vital Signs Has the patient been seen at the hospital within the last three years: Yes Total Score: 60 Level Of Care: New/Established - Level 2 Electronic Signature(s) Signed: 10/13/2021 2:40:31 PM By: Carlene Coria RN Entered By: Carlene Coria on 10/12/2021 08:31:58 [name], [name]. ([id number]) -------------------------------------------------------------------------------- Encounter Discharge Information Details Patient Name: [name], [name]. Date of Service: 10/12/2021 8:00 AM Medical Record Number: [id number] Patient Account Number: [id number] Date of Birth/Sex: 08/02/1940 (81 y.o. M) Treating RN: Carlene Coria Primary Care Jemarion Roycroft: Owens Loffler Other Clinician: Referring Lakeithia Rasor: Owens Loffler Treating Demar Shad/Extender: [name] in Treatment: 1 Encounter Discharge Information Items Discharge Condition: Stable Ambulatory Status: Ambulatory Discharge Destination: Home Transportation: Private Auto Accompanied By: wife Schedule Follow-up Appointment: Yes Clinical Summary of Care: Electronic Signature(s) Signed: 10/13/2021 2:40:31 PM By: Carlene Coria RN Entered By: Carlene Coria on 10/12/2021 08:33:25 [name], [name]. ([id number]) -------------------------------------------------------------------------------- Lower Extremity Assessment Details Patient Name: Vannostrand, Yosgar [name]. Date of Service: 10/12/2021 8:00 AM Medical Record Number: [id number] Patient Account Number: [id number] Date of Birth/Sex: March 27, 1941 (81 y.o. M) Treating RN: Carlene Coria Primary Care Indianna Boran: Owens Loffler Other Clinician: Referring Gaelen Brager: Owens Loffler Treating Dublin Cantero/Extender: [name] in Treatment: 1 Edema Assessment Assessed: [Left: No] [Right: No] Edema: [Left: Ye] [Right: s] Calf Left: Right: Point of Measurement: 36 cm From Medial Instep 35 cm Ankle Left: Right: Point of Measurement: 12 cm From Medial Instep 21 cm Knee To Floor Left: Right: From Medial Instep 46 cm Vascular Assessment Pulses: Dorsalis Pedis Palpable: [Left:Yes] Electronic Signature(s) Signed: 10/13/2021 2:40:31 PM By: Carlene Coria RN Entered By: Carlene Coria on 10/12/2021 08:18:49 Maguire, Bruce [name]. ([id number]) -------------------------------------------------------------------------------- Multi Wound Chart Details Patient Name: [name], [name]. Date of Service: 10/12/2021 8:00 AM Medical Record Number: [id number] Patient Account Number: [id number] Date of Birth/Sex: 11/07/1940 (81 y.o. M) Treating RN: Carlene Coria Primary Care Modesty Rudy: Owens Loffler Other Clinician: Referring Zale Marcotte:  Owens Loffler Treating Minaal Struckman/Extender: [name] in Treatment: 1 Vital Signs Height(in): 26 Pulse(bpm): 67 Weight(lbs): 174 Blood Pressure(mmHg): 124/75 Body Mass Index(BMI): 25.7 Temperature(F): 97.6 Respiratory Rate(breaths/min): 18 Photos: [N/A:N/A] Wound Location: Left, Medial Ankle N/A N/A Wounding Event: Gradually Appeared N/A N/A Primary Etiology: Venous Leg Ulcer N/A N/A Comorbid History: Coronary Artery Disease, Deep Vein N/A N/A Thrombosis, Hypertension, Peripheral Arterial Disease Date Acquired: 08/23/2021 N/A  N/A Weeks of Treatment: 1 N/A N/A Wound Status: Open N/A N/A Wound Recurrence: No N/A N/A Measurements L x W x D (cm) 0.4x0.3x0.1 N/A N/A Area (cm) : 0.094 N/A N/A Volume (cm) : 0.009 N/A N/A % Reduction in Area: 52.00% N/A N/A % Reduction in Volume: 55.00% N/A N/A Classification: Full Thickness Without Exposed N/A N/A Support Structures Exudate Amount: Medium N/A N/A Exudate Type: Serosanguineous N/A N/A Exudate Color: red, brown N/A N/A Granulation Amount: Medium (34-66%) N/A N/A Granulation Quality: Red N/A N/A Necrotic Amount: Medium (34-66%) N/A N/A Exposed Structures: Fat Layer (Subcutaneous Tissue): N/A N/A Yes Fascia: No Tendon: No Muscle: No Joint: No Bone: No Epithelialization: None N/A N/A Treatment Notes Electronic Signature(s) Signed: 10/13/2021 2:40:31 PM By: Carlene Coria RN Entered By: Carlene Coria on 10/12/2021 08:19:17 [name], [name]. ([id number]) -------------------------------------------------------------------------------- Multi-Disciplinary Care Plan Details Patient Name: [name], Hart [name]. Date of Service: 10/12/2021 8:00 AM Medical Record Number: [id number] Patient Account Number: [id number] Date of Birth/Sex: 09/29/1940 (81 y.o. M) Treating RN: Carlene Coria Primary Care Fernand Sorbello: Owens Loffler Other Clinician: Referring Ferron Ishmael: Owens Loffler Treating Zubair Lofton/Extender: [name] in Treatment:  1 Active Inactive Wound/Skin Impairment Nursing Diagnoses: Knowledge deficit related to ulceration/compromised skin integrity Goals: Patient/caregiver will verbalize understanding of skin care regimen Date Initiated: 10/05/2021 Target Resolution Date: 11/04/2021 Goal Status: Active Ulcer/skin breakdown will have a volume reduction of 30% by week 4 Date Initiated: 10/05/2021 Target Resolution Date: 11/04/2021 Goal Status: Active Ulcer/skin breakdown will have a volume reduction of 50% by week 8 Date Initiated: 10/05/2021 Target Resolution Date: 12/05/2021 Goal Status: Active Ulcer/skin breakdown will have a volume reduction of 80% by week 12 Date Initiated: 10/05/2021 Target Resolution Date: 01/05/2022 Goal Status: Active Ulcer/skin breakdown will heal within 14 weeks Date Initiated: 10/05/2021 Target Resolution Date: 02/04/2022 Goal Status: Active Interventions: Assess patient/caregiver ability to obtain necessary supplies Assess patient/caregiver ability to perform ulcer/skin care regimen upon admission and as needed Assess ulceration(s) every visit Notes: Electronic Signature(s) Signed: 10/13/2021 2:40:31 PM By: Carlene Coria RN Entered By: Carlene Coria on 10/12/2021 08:19:05 [name], [name]. ([id number]) -------------------------------------------------------------------------------- Pain Assessment Details Patient Name: [name], [name]. Date of Service: 10/12/2021 8:00 AM Medical Record Number: [id number] Patient Account Number: [id number] Date of Birth/Sex: 1941-02-17 (81 y.o. M) Treating RN: Carlene Coria Primary Care Riana Tessmer: Owens Loffler Other Clinician: Referring Kamyah Wilhelmsen: Owens Loffler Treating Kellan Raffield/Extender: [name] in Treatment: 1 Active Problems Location of Pain Severity and Description of Pain Patient Has Paino Yes Site Locations With Dressing Change: Yes Duration of the Pain. Constant / Intermittento Intermittent Rate the pain. Current Pain  Level: 0 Worst Pain Level: 5 Least Pain Level: 0 Tolerable Pain Level: 5 Character of Pain Describe the Pain: Sharp Pain Management and Medication Current Pain Management: Medication: Yes Cold Application: No Rest: Yes Massage: No Activity: No HenryE.N.S.: No Heat Application: No Leg drop or elevation: No Is the Current Pain Management Adequate: Inadequate How does your wound impact your activities of daily livingo Sleep: Yes Bathing: No Appetite: No Relationship With Others: No Bladder Continence: No Emotions: No Bowel Continence: No Work: No Toileting: No Drive: No Dressing: No Hobbies: No Electronic Signature(s) Signed: 10/13/2021 2:40:31 PM By: Carlene Coria RN Entered By: Carlene Coria on 10/12/2021 08:11:04 [name], [name]. ([id number]) -------------------------------------------------------------------------------- Patient/Caregiver Education Details Patient Name: [name], Ariyan [name]. Date of Service: 10/12/2021 8:00 AM Medical Record Number: [id number] Patient Account Number: [id number] Date of Birth/Gender: 09-25-1940 (81 y.o. M) Treating RN: Carlene Coria Primary Care Physician: Owens Loffler Other  Clinician: Referring Physician: Owens Loffler Treating Physician/Extender: [name] in Treatment: 1 Education Assessment Education Provided To: Patient Education Topics Provided Wound/Skin Impairment: Methods: Explain/Verbal Responses: State content correctly Electronic Signature(s) Signed: 10/13/2021 2:40:31 PM By: Carlene Coria RN Entered By: Carlene Coria on 10/12/2021 08:32:18 [name], [name]. ([id number]) -------------------------------------------------------------------------------- Wound Assessment Details Patient Name: [name], [name]. Date of Service: 10/12/2021 8:00 AM Medical Record Number: [id number] Patient Account Number: [id number] Date of Birth/Sex: 07-01-1940 (81 y.o. M) Treating RN: Carlene Coria Primary Care [place] Vierling: Owens Loffler Other  Clinician: Referring Azalyn Sliwa: Owens Loffler Treating Dj Senteno/Extender: [name] in Treatment: 1 Wound Status Wound Number: 1 Primary Venous Leg Ulcer Etiology: Wound Location: Left, Medial Ankle Wound Open Wounding Event: Gradually Appeared Status: Date Acquired: 08/23/2021 Comorbid Coronary Artery Disease, Deep Vein Thrombosis, Weeks Of Treatment: 1 History: Hypertension, Peripheral Arterial Disease Clustered Wound: No Photos Wound Measurements Length: (cm) 0.4 Width: (cm) 0.3 Depth: (cm) 0.1 Area: (cm) 0.094 Volume: (cm) 0.009 % Reduction in Area: 52% % Reduction in Volume: 55% Epithelialization: None Tunneling: No Undermining: No Wound Description Classification: Full Thickness Without Exposed Support Structu Exudate Amount: Medium Exudate Type: Serosanguineous Exudate Color: red, brown res Foul Odor After Cleansing: No Slough/Fibrino Yes Wound Bed Granulation Amount: Medium (34-66%) Exposed Structure Granulation Quality: Red Fascia Exposed: No Necrotic Amount: Medium (34-66%) Fat Layer (Subcutaneous Tissue) Exposed: Yes Necrotic Quality: Adherent Slough Tendon Exposed: No Muscle Exposed: No Joint Exposed: No Bone Exposed: No Treatment Notes Wound #1 (Ankle) Wound Laterality: Left, Medial Cleanser Peri-Wound Care Topical Gentamicin Riquelme, Michail TMarland Kitchen ([id number]) Discharge Instruction: mix with TCA and apply to peri wound a nd wound bed Triamcinolone Acetonide Cream, 0.1%, 15 (g) tube Discharge Instruction: mix with gentamycin and apply to peri wound a nd wound bed Primary Dressing Hydrofera Blue Ready Transfer Foam, 2.5x2.5 (in/in) Discharge Instruction: Apply Hydrofera Blue Ready to wound bed as directed Secondary Dressing Kerlix 4.5 x 4.1 (in/yd) Discharge Instruction: Apply Kerlix 4.5 x 4.1 (in/yd) as instructed Secured With Coban Cohesive Bandage 4x5 (yds) Stretched Discharge Instruction: Apply Coban as directed. Compression  Wrap Compression Stockings Add-Ons Electronic Signature(s) Signed: 10/13/2021 2:40:31 PM By: Carlene Coria RN Entered By: Carlene Coria on 10/12/2021 08:17:42 [name], [name]. ([id number]) -------------------------------------------------------------------------------- Vitals Details Patient Name: [name], Jan [name]. Date of Service: 10/12/2021 8:00 AM Medical Record Number: [id number] Patient Account Number: [id number] Date of Birth/Sex: 1940-07-08 (81 y.o. M) Treating RN: Carlene Coria Primary Care Symphoni Helbling: Owens Loffler Other Clinician: Referring Tyshawn Ciullo: Owens Loffler Treating Lillyrose Reitan/Extender: [name] in Treatment: 1 Vital Signs Time Taken: 08:10 Temperature (F): 97.6 Height (in): 69 Pulse (bpm): 82 Weight (lbs): 174 Respiratory Rate (breaths/min): 18 Body Mass Index (BMI): 25.7 Blood Pressure (mmHg): 124/75 Reference Range: 80 - 120 mg / dl Electronic Signature(s) Signed: 10/13/2021 2:40:31 PM By: Carlene Coria RN Entered By: Carlene Coria on 10/12/2021 08:10:29

## 2021-10-14 ENCOUNTER — Ambulatory Visit (INDEPENDENT_AMBULATORY_CARE_PROVIDER_SITE_OTHER): Payer: Medicare Other

## 2021-10-14 DIAGNOSIS — Z7901 Long term (current) use of anticoagulants: Secondary | ICD-10-CM

## 2021-10-14 LAB — POCT INR: INR: 2.2 (ref 2.0–3.0)

## 2021-10-14 NOTE — Patient Instructions (Addendum)
Pre visit review using our clinic review tool, if applicable. No additional management support is needed unless otherwise documented below in the visit note.  Continue 1 tablet (5 mg) daily except take 1/2 tablet (2.5 mg) on Tues, Thurs, and Sat.  Recheck in 5 weeks.

## 2021-10-14 NOTE — Progress Notes (Signed)
Continue 1 tablet (5 mg) daily except take 1/2 tablet (2.5 mg) on Tues, Thurs, and Sat.  Recheck in 5 weeks per pt request.

## 2021-10-15 ENCOUNTER — Other Ambulatory Visit: Payer: Self-pay | Admitting: Dermatology

## 2021-10-15 ENCOUNTER — Other Ambulatory Visit: Payer: Self-pay | Admitting: Cardiovascular Disease

## 2021-10-15 DIAGNOSIS — R21 Rash and other nonspecific skin eruption: Secondary | ICD-10-CM

## 2021-10-19 ENCOUNTER — Encounter: Payer: Medicare Other | Admitting: Physician Assistant

## 2021-10-19 DIAGNOSIS — I87332 Chronic venous hypertension (idiopathic) with ulcer and inflammation of left lower extremity: Secondary | ICD-10-CM | POA: Diagnosis not present

## 2021-10-19 DIAGNOSIS — I872 Venous insufficiency (chronic) (peripheral): Secondary | ICD-10-CM | POA: Diagnosis not present

## 2021-10-19 DIAGNOSIS — L97328 Non-pressure chronic ulcer of left ankle with other specified severity: Secondary | ICD-10-CM | POA: Diagnosis not present

## 2021-10-19 DIAGNOSIS — I70212 Atherosclerosis of native arteries of extremities with intermittent claudication, left leg: Secondary | ICD-10-CM | POA: Diagnosis not present

## 2021-10-19 DIAGNOSIS — L97322 Non-pressure chronic ulcer of left ankle with fat layer exposed: Secondary | ICD-10-CM | POA: Diagnosis not present

## 2021-10-19 DIAGNOSIS — I130 Hypertensive heart and chronic kidney disease with heart failure and stage 1 through stage 4 chronic kidney disease, or unspecified chronic kidney disease: Secondary | ICD-10-CM | POA: Diagnosis not present

## 2021-10-19 DIAGNOSIS — J449 Chronic obstructive pulmonary disease, unspecified: Secondary | ICD-10-CM | POA: Diagnosis not present

## 2021-10-19 DIAGNOSIS — N1832 Chronic kidney disease, stage 3b: Secondary | ICD-10-CM | POA: Diagnosis not present

## 2021-10-19 NOTE — Progress Notes (Addendum)
[name] ([id number]) Visit Report for 10/19/2021 Chief Complaint Document Details Patient Name: [name], [name] Date of Service: 10/19/2021 8:00 AM Medical Record Number: [id number] Patient Account Number: [id number] Date of Birth/Sex: 10/10/1940 (81 y.o. M) Treating RN: Carlene Coria Primary Care Provider: Owens Loffler Other Clinician: Referring Provider: Owens Loffler Treating Provider/Extender: Skipper Cliche in Treatment: 2 Information Obtained from: Patient Chief Complaint 10/05/2021; patient is here for review of a wound on the left medial ankle Electronic Signature(s) Signed: 10/19/2021 8:12:53 AM By: Worthy Keeler PA-C Entered By: Worthy Keeler on 10/19/2021 08:12:52 [name], [name] ([id number]) -------------------------------------------------------------------------------- Debridement Details Patient Name: [name], [name] Date of Service: 10/19/2021 8:00 AM Medical Record Number: [id number] Patient Account Number: [id number] Date of Birth/Sex: 1940/08/12 (81 y.o. M) Treating RN: Carlene Coria Primary Care Provider: Owens Loffler Other Clinician: Referring Provider: Owens Loffler Treating Provider/Extender: Skipper Cliche in Treatment: 2 Debridement Performed for Wound #1 Left,Medial Ankle Assessment: Performed By: Physician [name] Sams., PA-C Debridement Type: Debridement Severity of Tissue Pre Debridement: Fat layer exposed Level of Consciousness (Pre- Awake and Alert procedure): Pre-procedure Verification/Time Out Yes - 08:25 Taken: Start Time: 08:25 Pain Control: Lidocaine 4% Topical Solution Total Area Debrided (L x W): 0.4 (cm) x 0.3 (cm) = 0.12 (cm) Tissue and other material Viable, Non-Viable, Slough, Subcutaneous, Biofilm, Slough debrided: Level: Skin/Subcutaneous Tissue Debridement Description: Excisional Instrument: Curette Bleeding: Minimum Hemostasis Achieved: Pressure End Time: 08:28 Procedural Pain: 4 Post  Procedural Pain: 1 Response to Treatment: Procedure was tolerated well Level of Consciousness (Post- Awake and Alert procedure): Post Debridement Measurements of Total Wound Length: (cm) 0.4 Width: (cm) 0.3 Depth: (cm) 0.1 Volume: (cm) 0.009 Character of Wound/Ulcer Post Debridement: Improved Severity of Tissue Post Debridement: Fat layer exposed Post Procedure Diagnosis Same as Pre-procedure Electronic Signature(s) Signed: 10/20/2021 8:01:30 AM By: Carlene Coria RN Signed: 10/22/2021 4:59:17 PM By: Worthy Keeler PA-C Entered By: Carlene Coria on 10/19/2021 08:28:03 [name], [name] ([id number]) -------------------------------------------------------------------------------- HPI Details Patient Name: [name], [name] Date of Service: 10/19/2021 8:00 AM Medical Record Number: [id number] Patient Account Number: [id number] Date of Birth/Sex: 1940/07/09 (81 y.o. M) Treating RN: Carlene Coria Primary Care Provider: Owens Loffler Other Clinician: Referring Provider: Owens Loffler Treating Provider/Extender: Skipper Cliche in Treatment: 2 History of Present Illness HPI Description: ADMISSION 10/05/2021 This is an 81 year old man who comes to see [place] for a wound on his left medial ankle that is been there for about a month. He has not been dressing this specifically. The patient is known to have both chronic venous insufficiency and severe arterial insufficiency. For the latter he follows with Dr. Fletcher Anon. He has had extensive stenting including bilateral common iliac arteries, left renal artery and stenting in the left upper extremity. He has a remote smoking history and a lot of medical comorbidities but he is not a diabetic. He tells me that the wound on his left medial ankle started about a month ago or he noticed it about a month ago there was no trauma no bleeding no infection that he is aware of not able to tell me exactly how this would have started. Finds it to be quite  painful Past medical history is really quite extensive and includes ischemic cardiomyopathy, COPD, peripheral arterial disease, renal artery stenosis, history of a DVT, chronic kidney disease stage IIIb, hypertension, chronic lower extremity edema, diastolic heart failure. His last arteriogram was in January of this year. He was not felt to have any good endovascular options  and he is not felt to be a good surgical candidate. He takes cilostazol for the intermittent claudication. Last noninvasive studies were in December 2022 at which time his ABI in the right was 0.61 with dampened monophasic wounds. They could not obtain a great toe pressure on the left ABI at 0.56 with a TBI of 0.23 dampened monophasic waveforms 10-12-2021 upon evaluation today patient appears to be doing well with regard to his wounds. He was seen by Dr. Dellia Nims last week. The good news is he is actually showing signs of improvement compared to last week and overall very pleased in that regard. Fortunately there does not appear to be any signs of active infection locally or systemically which is great news. No fevers, chills, nausea, vomiting, or diarrhea. 10-19-2021 upon evaluation today patient appears to be doing well currently in regard to his wound all things considered he still is having quite significant discomfort but again I do believe that he is showing some signs of improvement unfortunately he is also having an issue with a significant rash which is yet to be fully diagnosed I believe. To be honest looking at it today and discussing the way things are going I really feel like he may have some psoriasis believe the palm involvement which is generally not something we see with just eczema or atopic dermatitis. Nonetheless all this was discussed with the patient out the details of this and the plan as well. Either way I do believe that the patient is going to require further dermatologic follow-up and treatment and he  actually has an appointment with a second opinion dermatologist tomorrow in [place]. Electronic Signature(s) Signed: 10/19/2021 9:15:12 AM By: Worthy Keeler PA-C Entered By: Worthy Keeler on 10/19/2021 09:15:11 [name], [name] ([id number]) -------------------------------------------------------------------------------- Physical Exam Details Patient Name: Verdejo, Vihaan T. Date of Service: 10/19/2021 8:00 AM Medical Record Number: [id number] Patient Account Number: [id number] Date of Birth/Sex: June 15, 1940 (81 y.o. M) Treating RN: Carlene Coria Primary Care Provider: Owens Loffler Other Clinician: Referring Provider: Owens Loffler Treating Provider/Extender: Skipper Cliche in Treatment: 2 Constitutional Well-nourished and well-hydrated in no acute distress. Respiratory normal breathing without difficulty. Psychiatric this patient is able to make decisions and demonstrates good insight into disease process. Alert and Oriented x 3. pleasant and cooperative. Notes Upon inspection patient's wound is actually showing signs of being about the same size wise I was able to clear away some of the necrotic debris he tolerated that with discomfort but was able to handle it we were very quick in getting it cleaned away. I think that the best thing to do is probably can be to just use the triamcinolone to the wound bed he is also using this to the leg in general for the rash. Will probably can make a change to the compression wrap based on what we are seeing. Electronic Signature(s) Signed: 10/19/2021 9:15:40 AM By: Worthy Keeler PA-C Entered By: Worthy Keeler on 10/19/2021 09:15:40 [name], [name] ([id number]) -------------------------------------------------------------------------------- Physician Orders Details Patient Name: [name], [name] Date of Service: 10/19/2021 8:00 AM Medical Record Number: [id number] Patient Account Number: [id number] Date of Birth/Sex: 02-Mar-1941 (81 y.o.  M) Treating RN: Carlene Coria Primary Care Provider: Owens Loffler Other Clinician: Referring Provider: Owens Loffler Treating Provider/Extender: Skipper Cliche in Treatment: 2 Verbal / Phone Orders: No Diagnosis Coding ICD-10 Coding Code Description [phone] Chronic venous hypertension (idiopathic) with ulcer and inflammation of left lower extremity L97.328 Non-pressure chronic ulcer of left ankle with other  specified severity I70.212 Atherosclerosis of native arteries of extremities with intermittent claudication, left leg Follow-up Appointments o Return Appointment in 1 week. Bathing/ Shower/ Hygiene o May shower; gently cleanse wound with antibacterial soap, rinse and pat dry prior to dressing wounds Edema Control - Lymphedema / Segmental Compressive Device / Other o Tubigrip double layer applied - size D o Elevate, Exercise Daily and Avoid Standing for Long Periods of Time. o Elevate legs to the level of the heart and pump ankles as often as possible o Elevate leg(s) parallel to the floor when sitting. Wound Treatment Wound #1 - Ankle Wound Laterality: Left, Medial Cleanser: Byram Ancillary Kit - 15 Day Supply (DME) (Generic) 3 x Per Week/30 Days Discharge Instructions: Use supplies as instructed; Kit contains: (15) Saline Bullets; (15) 3x3 Gauze; 15 pr Gloves Topical: Gentamicin 3 x Per Week/30 Days Discharge Instructions: mix with TCA and apply to peri wound a nd wound bed Topical: Triamcinolone Acetonide Cream, 0.1%, 15 (g) tube 3 x Per Week/30 Days Discharge Instructions: mix with gentamycin and apply to peri wound a nd wound bed Primary Dressing: Hydrofera Blue Ready Transfer Foam, 2.5x2.5 (in/in) 3 x Per Week/30 Days Discharge Instructions: Apply Hydrofera Blue Ready to wound bed as directed Secondary Dressing: (SILICONE BORDER) Zetuvit Plus SILICONE BORDER Dressing 4x4 (in/in) (DME) (Generic) 3 x Per Week/30 Days Discharge Instructions: Please do not  put silicone bordered dressings under wraps. Use non-bordered dressing only. Add-Ons: tubi grip D 3 x Per Week/30 Days Discharge Instructions: double layer Electronic Signature(s) Signed: 10/20/2021 8:01:30 AM By: Carlene Coria RN Signed: 10/22/2021 4:59:17 PM By: Worthy Keeler PA-C Entered By: Carlene Coria on 10/19/2021 08:49:46 [name], [name] ([id number]) -------------------------------------------------------------------------------- Problem List Details Patient Name: [name], [name] Date of Service: 10/19/2021 8:00 AM Medical Record Number: [id number] Patient Account Number: [id number] Date of Birth/Sex: 04-14-1941 (81 y.o. M) Treating RN: Carlene Coria Primary Care Provider: Owens Loffler Other Clinician: Referring Provider: Owens Loffler Treating Provider/Extender: Skipper Cliche in Treatment: 2 Active Problems ICD-10 Encounter Code Description Active Date MDM Diagnosis I87.332 Chronic venous hypertension (idiopathic) with ulcer and inflammation of 10/05/2021 No Yes left lower extremity L97.328 Non-pressure chronic ulcer of left ankle with other specified severity 10/05/2021 No Yes I70.212 Atherosclerosis of native arteries of extremities with intermittent 10/05/2021 No Yes claudication, left leg Inactive Problems Resolved Problems Electronic Signature(s) Signed: 10/19/2021 8:12:50 AM By: Worthy Keeler PA-C Entered By: Worthy Keeler on 10/19/2021 08:12:49 [name], [name] ([id number]) -------------------------------------------------------------------------------- Progress Note Details Patient Name: [name], [name] Date of Service: 10/19/2021 8:00 AM Medical Record Number: [id number] Patient Account Number: [id number] Date of Birth/Sex: [birth year] (81 y.o. M) Treating RN: Carlene Coria Primary Care Provider: Owens Loffler Other Clinician: Referring Provider: Owens Loffler Treating Provider/Extender: Skipper Cliche in Treatment: 2 Subjective Chief  Complaint Information obtained from Patient 10/05/2021; patient is here for review of a wound on the left medial ankle History of Present Illness (HPI) ADMISSION 10/05/2021 This is an 81 year old man who comes to see [place] for a wound on his left medial ankle that is been there for about a month. He has not been dressing this specifically. The patient is known to have both chronic venous insufficiency and severe arterial insufficiency. For the latter he follows with Dr. Fletcher Anon. He has had extensive stenting including bilateral common iliac arteries, left renal artery and stenting in the left upper extremity. He has a remote smoking history and a lot of medical comorbidities but he is not a diabetic. He  tells me that the wound on his left medial ankle started about a month ago or he noticed it about a month ago there was no trauma no bleeding no infection that he is aware of not able to tell me exactly how this would have started. Finds it to be quite painful Past medical history is really quite extensive and includes ischemic cardiomyopathy, COPD, peripheral arterial disease, renal artery stenosis, history of a DVT, chronic kidney disease stage IIIb, hypertension, chronic lower extremity edema, diastolic heart failure. His last arteriogram was in January of this year. He was not felt to have any good endovascular options and he is not felt to be a good surgical candidate. He takes cilostazol for the intermittent claudication. Last noninvasive studies were in December 2022 at which time his ABI in the right was 0.61 with dampened monophasic wounds. They could not obtain a great toe pressure on the left ABI at 0.56 with a TBI of 0.23 dampened monophasic waveforms 10-12-2021 upon evaluation today patient appears to be doing well with regard to his wounds. He was seen by Dr. Dellia Nims last week. The good news is he is actually showing signs of improvement compared to last week and overall very pleased in that  regard. Fortunately there does not appear to be any signs of active infection locally or systemically which is great news. No fevers, chills, nausea, vomiting, or diarrhea. 10-19-2021 upon evaluation today patient appears to be doing well currently in regard to his wound all things considered he still is having quite significant discomfort but again I do believe that he is showing some signs of improvement unfortunately he is also having an issue with a significant rash which is yet to be fully diagnosed I believe. To be honest looking at it today and discussing the way things are going I really feel like he may have some psoriasis believe the palm involvement which is generally not something we see with just eczema or atopic dermatitis. Nonetheless all this was discussed with the patient out the details of this and the plan as well. Either way I do believe that the patient is going to require further dermatologic follow-up and treatment and he actually has an appointment with a second opinion dermatologist tomorrow in [place]. Objective Constitutional Well-nourished and well-hydrated in no acute distress. Vitals Time Taken: 8:09 AM, Height: 69 in, Weight: 174 lbs, BMI: 25.7, Temperature: 97.5 F, Pulse: 76 bpm, Respiratory Rate: 18 breaths/min, Blood Pressure: 131/75 mmHg. Respiratory normal breathing without difficulty. Psychiatric this patient is able to make decisions and demonstrates good insight into disease process. Alert and Oriented x 3. pleasant and cooperative. General Notes: Upon inspection patient's wound is actually showing signs of being about the same size wise I was able to clear away some of the necrotic debris he tolerated that with discomfort but was able to handle it we were very quick in getting it cleaned away. I think that the best thing to do is probably can be to just use the triamcinolone to the wound bed he is also using this to the leg in general for the rash. Will  probably [name], [name] ([id number]) can make a change to the compression wrap based on what we are seeing. Integumentary (Hair, Skin) Wound #1 status is Open. Original cause of wound was Gradually Appeared. The date acquired was: 08/23/2021. The wound has been in treatment 2 weeks. The wound is located on the Left,Medial Ankle. The wound measures 0.4cm length x 0.3cm width x  0.1cm depth; 0.094cm^2 area and 0.009cm^3 volume. There is Fat Layer (Subcutaneous Tissue) exposed. There is no tunneling or undermining noted. There is a medium amount of serosanguineous drainage noted. There is medium (34-66%) red granulation within the wound bed. There is a medium (34-66%) amount of necrotic tissue within the wound bed including Adherent Slough. Assessment Active Problems ICD-10 Chronic venous hypertension (idiopathic) with ulcer and inflammation of left lower extremity Non-pressure chronic ulcer of left ankle with other specified severity Atherosclerosis of native arteries of extremities with intermittent claudication, left leg Procedures Wound #1 Pre-procedure diagnosis of Wound #1 is a Venous Leg Ulcer located on the Left,Medial Ankle .Severity of Tissue Pre Debridement is: Fat layer exposed. There was a Excisional Skin/Subcutaneous Tissue Debridement with a total area of 0.12 sq cm performed by [name] Sams., PA-C. With the following instrument(s): Curette to remove Viable and Non-Viable tissue/material. Material removed includes Subcutaneous Tissue, Slough, and Biofilm after achieving pain control using Lidocaine 4% Topical Solution. A time out was conducted at 08:25, prior to the start of the procedure. A Minimum amount of bleeding was controlled with Pressure. The procedure was tolerated well with a pain level of 4 throughout and a pain level of 1 following the procedure. Post Debridement Measurements: 0.4cm length x 0.3cm width x 0.1cm depth; 0.009cm^3 volume. Character of Wound/Ulcer Post  Debridement is improved. Severity of Tissue Post Debridement is: Fat layer exposed. Post procedure Diagnosis Wound #1: Same as Pre-Procedure Plan Follow-up Appointments: Return Appointment in 1 week. Bathing/ Shower/ Hygiene: May shower; gently cleanse wound with antibacterial soap, rinse and pat dry prior to dressing wounds Edema Control - Lymphedema / Segmental Compressive Device / Other: Tubigrip double layer applied - size D Elevate, Exercise Daily and Avoid Standing for Long Periods of Time. Elevate legs to the level of the heart and pump ankles as often as possible Elevate leg(s) parallel to the floor when sitting. WOUND #1: - Ankle Wound Laterality: Left, Medial Cleanser: Byram Ancillary Kit - 15 Day Supply (DME) (Generic) 3 x Per Week/30 Days Discharge Instructions: Use supplies as instructed; Kit contains: (15) Saline Bullets; (15) 3x3 Gauze; 15 pr Gloves Topical: Gentamicin 3 x Per Week/30 Days Discharge Instructions: mix with TCA and apply to peri wound a nd wound bed Topical: Triamcinolone Acetonide Cream, 0.1%, 15 (g) tube 3 x Per Week/30 Days Discharge Instructions: mix with gentamycin and apply to peri wound a nd wound bed Primary Dressing: Hydrofera Blue Ready Transfer Foam, 2.5x2.5 (in/in) 3 x Per Week/30 Days Discharge Instructions: Apply Hydrofera Blue Ready to wound bed as directed Secondary Dressing: (SILICONE BORDER) Zetuvit Plus SILICONE BORDER Dressing 4x4 (in/in) (DME) (Generic) 3 x Per Week/30 Days Discharge Instructions: Please do not put silicone bordered dressings under wraps. Use non-bordered dressing only. Add-Ons: tubi grip D 3 x Per Week/30 Days Discharge Instructions: double layer 1. Based on the current findings I am good recommend that he continue with the triamcinolone to the leg as well as to the wound opening. He is good to cover the wound with Hydrofera Blue applies dressing and then will apply the triamcinolone to the leg. 2. We will get a switch  to using double layer Tubigrip over top of this which I think will get better as he will be able to pull this down to get to applying lotion as well as the triamcinolone to his leg which will be much better for him I do believe. [name], [name] ([id number]) 3. I am also going to suggest  that we had the patient continue to elevate his legs is much as possible. 4. Based on physical examination today I have a concern honestly that the patient may have psoriasis. A lot of the areas where he has a rash appear to be more plaque-like and he also has involvement of the palms of his hands which is unusual for atopic dermatitis. In general I think this scenario and picture seems to point more to a psoriasis type picture. Nonetheless I want to see what the patient's dermatologist second opinion tomorrow has to say although I did give him a printout of patient handout on psoriasis as well as showing him several pictures today in the clinic of what psoriasis looks like. He is very thankful for this and states he will discuss this with the dermatologist tomorrow as well. We will see what they have to say. We will see patient back for reevaluation in 1 week here in the clinic. If anything worsens or changes patient will contact our office for additional recommendations. Electronic Signature(s) Signed: 10/19/2021 9:17:13 AM By: Worthy Keeler PA-C Entered By: Worthy Keeler on 10/19/2021 09:17:13 [name], [name] ([id number]) -------------------------------------------------------------------------------- SuperBill Details Patient Name: [name], [name] Date of Service: 10/19/2021 Medical Record Number: [id number] Patient Account Number: [id number] Date of Birth/Sex: 05-Mar-1941 (81 y.o. M) Treating RN: Carlene Coria Primary Care Provider: Owens Loffler Other Clinician: Referring Provider: Owens Loffler Treating Provider/Extender: Skipper Cliche in Treatment: 2 Diagnosis Coding ICD-10 Codes Code  Description [phone] Chronic venous hypertension (idiopathic) with ulcer and inflammation of left lower extremity L97.328 Non-pressure chronic ulcer of left ankle with other specified severity I70.212 Atherosclerosis of native arteries of extremities with intermittent claudication, left leg Facility Procedures CPT4 Code: [id number] Description: 20721 - DEB SUBQ TISSUE 20 SQ CM/< Modifier: Quantity: 1 CPT4 Code: Description: ICD-10 Diagnosis Description L97.328 Non-pressure chronic ulcer of left ankle with other specified severity Modifier: Quantity: Physician Procedures CPT4 Code Description: [id number] 99214 - WC PHYS LEVEL 4 - EST PT Modifier: 25 Quantity: 1 CPT4 Code Description: ICD-10 Diagnosis Description I87.332 Chronic venous hypertension (idiopathic) with ulcer and inflammation of left L97.328 Non-pressure chronic ulcer of left ankle with other specified severity I70.212 Atherosclerosis of native  arteries of extremities with intermittent claudicat Modifier: lower extremity ion, left leg Quantity: CPT4 Code Description: [id number] 11042 - WC PHYS SUBQ TISS 20 SQ CM Modifier: Quantity: 1 CPT4 Code Description: ICD-10 Diagnosis Description L97.328 Non-pressure chronic ulcer of left ankle with other specified severity Modifier: Quantity: Electronic Signature(s) Signed: 10/19/2021 9:17:28 AM By: Worthy Keeler PA-C Entered By: Worthy Keeler on 10/19/2021 09:17:28

## 2021-10-20 DIAGNOSIS — L0889 Other specified local infections of the skin and subcutaneous tissue: Secondary | ICD-10-CM | POA: Diagnosis not present

## 2021-10-20 DIAGNOSIS — L309 Dermatitis, unspecified: Secondary | ICD-10-CM | POA: Diagnosis not present

## 2021-10-20 NOTE — Progress Notes (Signed)
[name]. ([id number]) Visit Report for 10/19/2021 Arrival Information Details Patient Name: [name], [name]. Date of Service: 10/19/2021 8:00 AM Medical Record Number: [id number] Patient Account Number: [id number] Date of Birth/Sex: 08/07/1940 (81 y.o. M) Treating RN: Epps, Carrie Primary Care : Copland, Spencer Other Clinician: Referring : Copland, Spencer Treating /Extender: Stone, Hoyt Weeks in Treatment: 2 Visit Information History Since Last Visit All ordered tests and consults were completed: No Patient Arrived: Ambulatory Added or deleted any medications: No Arrival Time: 08:03 Any new allergies or adverse reactions: No Accompanied By: self Had a fall or experienced change in No Transfer Assistance: None activities of daily living that may affect Patient Identification Verified: Yes risk of falls: Secondary Verification Process Completed: Yes Signs or symptoms of abuse/neglect since last visito No Patient Requires Transmission-Based No Hospitalized since last visit: No Precautions: Implantable device outside of the clinic excluding No Patient Has Alerts: Yes cellular tissue based products placed in the center Patient Alerts: Patient on Blood since last visit: Thinner Has Dressing in Place as Prescribed: Yes L ABI .56 04/22/21 Pain Present Now: No R ABI .61 04/22/21 Electronic Signature(s) Signed: 10/20/2021 8:01:30 AM By: Epps, Carrie RN Entered By: Epps, Carrie on 10/19/2021 08:09:03 [name]. ([id number]) -------------------------------------------------------------------------------- Clinic Level of Care Assessment Details Patient Name: [name], [name]. Date of Service: 10/19/2021 8:00 AM Medical Record Number: [id number] Patient Account Number: [id number] Date of Birth/Sex: 06/20/1940 (81 y.o. M) Treating RN: Epps, Carrie Primary Care : Copland, Spencer Other Clinician: Referring : Copland, Spencer Treating  /Extender: Stone, Hoyt Weeks in Treatment: 2 Clinic Level of Care Assessment Items TOOL 1 Quantity Score [] - Use when EandM and Procedure is performed on INITIAL visit 0 ASSESSMENTS - Nursing Assessment / Reassessment [] - General Physical Exam (combine w/ comprehensive assessment (listed just below) when performed on new 0 pt. evals) [] - 0 Comprehensive Assessment (HX, ROS, Risk Assessments, Wounds Hx, etc.) ASSESSMENTS - Wound and Skin Assessment / Reassessment [] - Dermatologic / Skin Assessment (not related to wound area) 0 ASSESSMENTS - Ostomy and/or Continence Assessment and Care [] - Incontinence Assessment and Management 0 [] - 0 Ostomy Care Assessment and Management (repouching, etc.) PROCESS - Coordination of Care [] - Simple Patient / Family Education for ongoing care 0 [] - 0 Complex (extensive) Patient / Family Education for ongoing care [] - 0 Staff obtains Consents, Records, Test Results / Process Orders [] - 0 Staff telephones HHA, Nursing Homes / Clarify orders / etc [] - 0 Routine Transfer to another Facility (non-emergent condition) [] - 0 Routine Hospital Admission (non-emergent condition) [] - 0 New Admissions / Insurance Authorizations / Ordering NPWT, Apligraf, etc. [] - 0 Emergency Hospital Admission (emergent condition) PROCESS - Special Needs [] - Pediatric / Minor Patient Management 0 [] - 0 Isolation Patient Management [] - 0 Hearing / Language / Visual special needs [] - 0 Assessment of Community assistance (transportation, D/C planning, etc.) [] - 0 Additional assistance / Altered mentation [] - 0 Support Surface(s) Assessment (bed, cushion, seat, etc.) INTERVENTIONS - Miscellaneous [] - External ear exam 0 [] - 0 Patient Transfer (multiple staff / Hoyer Lift / Similar devices) [] - 0 Simple Staple / Suture removal (25 or less) [] - 0 Complex Staple / Suture removal (26 or more) [] - 0 Hypo/Hyperglycemic Management (do not  check if billed separately) [] - 0 Ankle / Brachial Index (ABI) - do not check if billed separately Has the patient been   seen at the hospital within the last three years: Yes Total Score: 0 Level Of Care: ____ [name], [name]. ([id number]) Electronic Signature(s) Signed: 10/20/2021 8:01:30 AM By: Epps, Carrie RN Entered By: Epps, Carrie on 10/19/2021 08:29:53 [name]. ([id number]) -------------------------------------------------------------------------------- Encounter Discharge Information Details Patient Name: [name], [name]. Date of Service: 10/19/2021 8:00 AM Medical Record Number: [id number] Patient Account Number: [id number] Date of Birth/Sex: 09/29/1940 (81 y.o. M) Treating RN: Epps, Carrie Primary Care : Copland, Spencer Other Clinician: Referring : Copland, Spencer Treating /Extender: Stone, Hoyt Weeks in Treatment: 2 Encounter Discharge Information Items Post Procedure Vitals Discharge Condition: Stable Temperature (F): 97.5 Ambulatory Status: Ambulatory Pulse (bpm): 76 Discharge Destination: Home Respiratory Rate (breaths/min): 18 Transportation: Private Auto Blood Pressure (mmHg): 131/75 Accompanied By: wife Schedule Follow-up Appointment: Yes Clinical Summary of Care: Electronic Signature(s) Signed: 10/20/2021 8:01:30 AM By: Epps, Carrie RN Entered By: Epps, Carrie on 10/19/2021 08:31:45 [name]. ([id number]) -------------------------------------------------------------------------------- Lower Extremity Assessment Details Patient Name: [name], [name]. Date of Service: 10/19/2021 8:00 AM Medical Record Number: [id number] Patient Account Number: [id number] Date of Birth/Sex: 11/09/1940 (81 y.o. M) Treating RN: Epps, Carrie Primary Care : Copland, Spencer Other Clinician: Referring : Copland, Spencer Treating /Extender: Stone, Hoyt Weeks in Treatment: 2 Edema Assessment Assessed: [Left: No] [Right:  No] Edema: [Left: Ye] [Right: s] Calf Left: Right: Point of Measurement: 36 cm From Medial Instep 37 cm Ankle Left: Right: Point of Measurement: 12 cm From Medial Instep 22 cm Vascular Assessment Pulses: Dorsalis Pedis Palpable: [Left:Yes] Electronic Signature(s) Signed: 10/20/2021 8:01:30 AM By: Epps, Carrie RN Entered By: Epps, Carrie on 10/19/2021 08:16:30 Ryther, Alim [name]. ([id number]) -------------------------------------------------------------------------------- Multi Wound Chart Details Patient Name: [name], [name]. Date of Service: 10/19/2021 8:00 AM Medical Record Number: [id number] Patient Account Number: [id number] Date of Birth/Sex: 02/25/1941 (81 y.o. M) Treating RN: Epps, Carrie Primary Care : Copland, Spencer Other Clinician: Referring : Copland, Spencer Treating /Extender: Stone, Hoyt Weeks in Treatment: 2 Vital Signs Height(in): 69 Pulse(bpm): 76 Weight(lbs): 174 Blood Pressure(mmHg): 131/75 Body Mass Index(BMI): 25.7 Temperature(°F): 97.5 Respiratory Rate(breaths/min): 18 Photos: [N/A:N/A] Wound Location: Left, Medial Ankle N/A N/A Wounding Event: Gradually Appeared N/A N/A Primary Etiology: Venous Leg Ulcer N/A N/A Comorbid History: Coronary Artery Disease, Deep Vein N/A N/A Thrombosis, Hypertension, Peripheral Arterial Disease Date Acquired: 08/23/2021 N/A N/A Weeks of Treatment: 2 N/A N/A Wound Status: Open N/A N/A Wound Recurrence: No N/A N/A Measurements L x W x D (cm) 0.4x0.3x0.1 N/A N/A Area (cm) : 0.094 N/A N/A Volume (cm) : 0.009 N/A N/A % Reduction in Area: 52.00% N/A N/A % Reduction in Volume: 55.00% N/A N/A Classification: Full Thickness Without Exposed N/A N/A Support Structures Exudate Amount: Medium N/A N/A Exudate Type: Serosanguineous N/A N/A Exudate Color: red, brown N/A N/A Granulation Amount: Medium (34-66%) N/A N/A Granulation Quality: Red N/A N/A Necrotic Amount: Medium (34-66%) N/A N/A Exposed  Structures: Fat Layer (Subcutaneous Tissue): N/A N/A Yes Fascia: No Tendon: No Muscle: No Joint: No Bone: No Epithelialization: None N/A N/A Treatment Notes Electronic Signature(s) Signed: 10/20/2021 8:01:30 AM By: Epps, Carrie RN Entered By: Epps, Carrie on 10/19/2021 08:16:46 [name], [name]. ([id number]) -------------------------------------------------------------------------------- Multi-Disciplinary Care Plan Details Patient Name: [name], [name]. Date of Service: 10/19/2021 8:00 AM Medical Record Number: [id number] Patient Account Number: [id number] Date of Birth/Sex: 03/10/1941 (81 y.o. M) Treating RN: Epps, Carrie Primary Care : Copland, Spencer Other Clinician: Referring : Copland, Spencer Treating /Extender: Stone, Hoyt Weeks in Treatment: 2 Active Inactive Wound/Skin Impairment Nursing Diagnoses: Knowledge deficit   related to ulceration/compromised skin integrity Goals: Patient/caregiver will verbalize understanding of skin care regimen Date Initiated: 10/05/2021 Target Resolution Date: 11/04/2021 Goal Status: Active Ulcer/skin breakdown will have a volume reduction of 30% by week 4 Date Initiated: 10/05/2021 Target Resolution Date: 11/04/2021 Goal Status: Active Ulcer/skin breakdown will have a volume reduction of 50% by week 8 Date Initiated: 10/05/2021 Target Resolution Date: 12/05/2021 Goal Status: Active Ulcer/skin breakdown will have a volume reduction of 80% by week 12 Date Initiated: 10/05/2021 Target Resolution Date: 01/05/2022 Goal Status: Active Ulcer/skin breakdown will heal within 14 weeks Date Initiated: 10/05/2021 Target Resolution Date: 02/04/2022 Goal Status: Active Interventions: Assess patient/caregiver ability to obtain necessary supplies Assess patient/caregiver ability to perform ulcer/skin care regimen upon admission and as needed Assess ulceration(s) every visit Notes: Electronic Signature(s) Signed: 10/20/2021 8:01:30  AM By: Carlene Coria RN Entered By: Carlene Coria on 10/19/2021 08:16:36 Burdell, Alyxander [name]. ([id number]) -------------------------------------------------------------------------------- Pain Assessment Details Patient Name: [name], [name]. Date of Service: 10/19/2021 8:00 AM Medical Record Number: [id number] Patient Account Number: [id number] Date of Birth/Sex: [birth year] (81 y.o. M) Treating RN: Carlene Coria Primary Care Noretta Frier: Owens Loffler Other Clinician: Referring Antonius Hartlage: Owens Loffler Treating Charlette Hennings/Extender: Skipper Cliche in Treatment: 2 Active Problems Location of Pain Severity and Description of Pain Patient Has Paino No Site Locations Pain Management and Medication Current Pain Management: Electronic Signature(s) Signed: 10/20/2021 8:01:30 AM By: Carlene Coria RN Entered By: Carlene Coria on 10/19/2021 08:09:30 [name], [name]. ([id number]) -------------------------------------------------------------------------------- Patient/Caregiver Education Details Patient Name: [name], [name]. Date of Service: 10/19/2021 8:00 AM Medical Record Number: [id number] Patient Account Number: [id number] Date of Birth/Gender: [birth year] (81 y.o. M) Treating RN: Carlene Coria Primary Care Physician: Owens Loffler Other Clinician: Referring Physician: Owens Loffler Treating Physician/Extender: Skipper Cliche in Treatment: 2 Education Assessment Education Provided To: Patient Education Topics Provided Wound/Skin Impairment: Methods: Explain/Verbal Responses: State content correctly Electronic Signature(s) Signed: 10/20/2021 8:01:30 AM By: Carlene Coria RN Entered By: Carlene Coria on 10/19/2021 08:30:06 [name], [name]. ([id number]) -------------------------------------------------------------------------------- Wound Assessment Details Patient Name: [name], [name]. Date of Service: 10/19/2021 8:00 AM Medical Record Number: [id number] Patient Account Number:  [id number] Date of Birth/Sex: June 09, 1940 (81 y.o. M) Treating RN: Carlene Coria Primary Care Charlane Westry: Owens Loffler Other Clinician: Referring Olukemi Panchal: Owens Loffler Treating Sabastion Hrdlicka/Extender: Skipper Cliche in Treatment: 2 Wound Status Wound Number: 1 Primary Venous Leg Ulcer Etiology: Wound Location: Left, Medial Ankle Wound Open Wounding Event: Gradually Appeared Status: Date Acquired: 08/23/2021 Comorbid Coronary Artery Disease, Deep Vein Thrombosis, Weeks Of Treatment: 2 History: Hypertension, Peripheral Arterial Disease Clustered Wound: No Photos Wound Measurements Length: (cm) 0.4 Width: (cm) 0.3 Depth: (cm) 0.1 Area: (cm) 0.094 Volume: (cm) 0.009 % Reduction in Area: 52% % Reduction in Volume: 55% Epithelialization: None Tunneling: No Undermining: No Wound Description Classification: Full Thickness Without Exposed Support Structures Exudate Amount: Medium Exudate Type: Serosanguineous Exudate Color: red, brown Foul Odor After Cleansing: No Slough/Fibrino Yes Wound Bed Granulation Amount: Medium (34-66%) Exposed Structure Granulation Quality: Red Fascia Exposed: No Necrotic Amount: Medium (34-66%) Fat Layer (Subcutaneous Tissue) Exposed: Yes Necrotic Quality: Adherent Slough Tendon Exposed: No Muscle Exposed: No Joint Exposed: No Bone Exposed: No Treatment Notes Wound #1 (Ankle) Wound Laterality: Left, Medial Cleanser [place] Ancillary Kit - 15 Day Supply Discharge Instruction: Use supplies as instructed; Kit contains: (15) Saline Bullets; (15) 3x3 Gauze; 15 pr Gloves Peri-Wound Care Cavallaro, Bora [name]. ([id number]) Topical Gentamicin Discharge Instruction: mix with TCA and apply to peri wound a nd wound bed Triamcinolone Acetonide Cream,  0.1%, 15 (g) tube Discharge Instruction: mix with gentamycin and apply to peri wound a nd wound bed Primary Dressing Hydrofera Blue Ready Transfer Foam, 2.5x2.5 (in/in) Discharge Instruction: Apply Hydrofera  Blue Ready to wound bed as directed Secondary Dressing (SILICONE BORDER) Zetuvit Plus SILICONE BORDER Dressing 4x4 (in/in) Discharge Instruction: Please do not put silicone bordered dressings under wraps. Use non-bordered dressing only. Secured With Compression Wrap Compression Stockings Add-Ons tubi grip D Discharge Instruction: double layer Electronic Signature(s) Signed: 10/20/2021 8:01:30 AM By: Epps, Carrie RN Entered By: Epps, Carrie on 10/19/2021 08:15:24 Tozzi, Davien [name]. ([id number]) -------------------------------------------------------------------------------- Vitals Details Patient Name: Voller, Jazziel [name]. Date of Service: 10/19/2021 8:00 AM Medical Record Number: [id number] Patient Account Number: [id number] Date of Birth/Sex: 01/20/1941 (81 y.o. M) Treating RN: Epps, Carrie Primary Care : Copland, Spencer Other Clinician: Referring : Copland, Spencer Treating /Extender: Stone, Hoyt Weeks in Treatment: 2 Vital Signs Time Taken: 08:09 Temperature (°F): 97.5 Height (in): 69 Pulse (bpm): 76 Weight (lbs): 174 Respiratory Rate (breaths/min): 18 Body Mass Index (BMI): 25.7 Blood Pressure (mmHg): 131/75 Reference Range: 80 - 120 mg / dl Electronic Signature(s) Signed: 10/20/2021 8:01:30 AM By: Epps, Carrie RN Entered By: Epps, Carrie on 10/19/2021 08:09:23 

## 2021-10-21 ENCOUNTER — Telehealth: Payer: Self-pay

## 2021-10-21 NOTE — Telephone Encounter (Signed)
Patient is calling in stating that he was placed on Doxycyline 100 MG twice daily, states he wanted to inform Larene [place] incase it interferes with Warfarin.

## 2021-10-22 NOTE — Telephone Encounter (Signed)
Patient wife called to follow up on this, she said that next appt was 11/18/21 and that that was to far to follow up and just asked for a call back at [phone]

## 2021-10-25 ENCOUNTER — Encounter: Payer: Medicare Other | Attending: Physician Assistant | Admitting: Physician Assistant

## 2021-10-25 DIAGNOSIS — L97328 Non-pressure chronic ulcer of left ankle with other specified severity: Secondary | ICD-10-CM | POA: Diagnosis not present

## 2021-10-25 DIAGNOSIS — I87332 Chronic venous hypertension (idiopathic) with ulcer and inflammation of left lower extremity: Secondary | ICD-10-CM | POA: Diagnosis not present

## 2021-10-25 DIAGNOSIS — L97322 Non-pressure chronic ulcer of left ankle with fat layer exposed: Secondary | ICD-10-CM | POA: Diagnosis not present

## 2021-10-25 DIAGNOSIS — I70212 Atherosclerosis of native arteries of extremities with intermittent claudication, left leg: Secondary | ICD-10-CM | POA: Insufficient documentation

## 2021-10-25 NOTE — Progress Notes (Signed)
RAYMUND, MANRIQUE ([id number]) Visit Report for 10/25/2021 Chief Complaint Document Details Patient Name: MAIONE, Nasif T. Date of Service: 10/25/2021 10:45 AM Medical Record Number: [id number] Patient Account Number: [id number] Date of Birth/Sex: 06-17-1940 (81 y.o. M) Treating RN: Cornell Barman Primary Care Provider: Owens Loffler Other Clinician: Massie Kluver Referring Provider: Owens Loffler Treating Provider/Extender: Skipper Cliche in Treatment: 2 Information Obtained from: Patient Chief Complaint 10/05/2021; patient is here for review of a wound on the left medial ankle Electronic Signature(s) Signed: 10/25/2021 11:08:48 AM By: Worthy Keeler PA-C Entered By: Worthy Keeler on 10/25/2021 11:08:47 Petti, Torrance T. ([id number]) -------------------------------------------------------------------------------- Problem List Details Patient Name: Skeen, Jamesrobert T. Date of Service: 10/25/2021 10:45 AM Medical Record Number: [id number] Patient Account Number: [id number] Date of Birth/Sex: 1940-07-26 (81 y.o. M) Treating RN: Cornell Barman Primary Care Provider: Owens Loffler Other Clinician: Massie Kluver Referring Provider: Owens Loffler Treating Provider/Extender: Skipper Cliche in Treatment: 2 Active Problems ICD-10 Encounter Code Description Active Date MDM Diagnosis I87.332 Chronic venous hypertension (idiopathic) with ulcer and inflammation of 10/05/2021 No Yes left lower extremity L97.328 Non-pressure chronic ulcer of left ankle with other specified severity 10/05/2021 No Yes I70.212 Atherosclerosis of native arteries of extremities with intermittent 10/05/2021 No Yes claudication, left leg Inactive Problems Resolved Problems Electronic Signature(s) Signed: 10/25/2021 11:08:44 AM By: Worthy Keeler PA-C Entered By: Worthy Keeler on 10/25/2021 11:08:44

## 2021-10-28 ENCOUNTER — Ambulatory Visit (INDEPENDENT_AMBULATORY_CARE_PROVIDER_SITE_OTHER): Payer: Medicare Other

## 2021-10-28 DIAGNOSIS — Z7901 Long term (current) use of anticoagulants: Secondary | ICD-10-CM

## 2021-10-28 LAB — POCT INR: INR: 3.6 — AB (ref 2.0–3.0)

## 2021-10-28 NOTE — Progress Notes (Signed)
Pt has been started on doxycycline, 100 mg, x 4 weeks for staph infection. Pt in today to monitor for interactions.  Dosing has been reduced due to interaction.  Hold dose today and reduce dose tomorrow to take 1/2 tablet and then change dosing to take 1/2 tablet daily except take 1 tablet on Monday. Recheck in 1 week.

## 2021-10-28 NOTE — Telephone Encounter (Signed)
Pt scheduled for coumadin clinic for this afternoon.

## 2021-10-28 NOTE — Patient Instructions (Addendum)
Pre visit review using our clinic review tool, if applicable. No additional management support is needed unless otherwise documented below in the visit note.  Hold dose today and reduce dose tomorrow to take 1/2 tablet and then change dosing to take 1/2 tablet daily except take 1 tablet on Monday. Recheck in 1 week.

## 2021-10-28 NOTE — Progress Notes (Signed)
DAROL, CUSH ([id number]) Visit Report for 10/25/2021 Arrival Information Details Patient Name: NUNNERY, Kenith T. Date of Service: 10/25/2021 10:45 AM Medical Record Number: [id number] Patient Account Number: [id number] Date of Birth/Sex: July 24, 1940 (81 y.o. M) Treating RN: Cornell Barman Primary Care Sedrick Tober: Owens Loffler Other Clinician: Massie Kluver Referring Kanya Potteiger: Owens Loffler Treating Georgeanna Radziewicz/Extender: Skipper Cliche in Treatment: 2 Visit Information History Since Last Visit All ordered tests and consults were completed: No Patient Arrived: Ambulatory Added or deleted any medications: Yes Arrival Time: 10:53 Any new allergies or adverse reactions: No Transfer Assistance: None Had a fall or experienced change in No Patient Requires Transmission-Based No activities of daily living that may affect Precautions: risk of falls: Patient Has Alerts: Yes Hospitalized since last visit: No Patient Alerts: Patient on Blood Pain Present Now: Yes Thinner L ABI .56 04/22/21 R ABI .61 04/22/21 Electronic Signature(s) Signed: 10/28/2021 4:13:59 PM By: Massie Kluver Entered By: Massie Kluver on 10/25/2021 10:53:58 Froning, Rene T. ([id number]) -------------------------------------------------------------------------------- Clinic Level of Care Assessment Details Patient Name: Iams, Branon T. Date of Service: 10/25/2021 10:45 AM Medical Record Number: [id number] Patient Account Number: [id number] Date of Birth/Sex: [birth year] (81 y.o. M) Treating RN: Cornell Barman Primary Care Chyanne Kohut: Owens Loffler Other Clinician: Massie Kluver Referring Aryanna Shaver: Owens Loffler Treating Jorene Kaylor/Extender: Skipper Cliche in Treatment: 2 Clinic Level of Care Assessment Items TOOL 4 Quantity Score _0  - Use when only an EandM is performed on FOLLOW-UP visit 0 ASSESSMENTS - Nursing Assessment / Reassessment X - Reassessment of Co-morbidities (includes updates in patient status) 1  10 X- 1 5 Reassessment of Adherence to Treatment Plan ASSESSMENTS - Wound and Skin Assessment / Reassessment X - Simple Wound Assessment / Reassessment - one wound 1 5 _1  - 0 Complex Wound Assessment / Reassessment - multiple wounds _2  - 0 Dermatologic / Skin Assessment (not related to wound area) ASSESSMENTS - Focused Assessment _3  - Circumferential Edema Measurements - multi extremities 0 _4  - 0 Nutritional Assessment / Counseling / Intervention _5  - 0 Lower Extremity Assessment (monofilament, tuning fork, pulses) _6  - 0 Peripheral Arterial Disease Assessment (using hand held doppler) ASSESSMENTS - Ostomy and/or Continence Assessment and Care _7  - Incontinence Assessment and Management 0 _8  - 0 Ostomy Care Assessment and Management (repouching, etc.) PROCESS - Coordination of Care X - Simple Patient / Family Education for ongoing care 1 15 _9  - 0 Complex (extensive) Patient / Family Education for ongoing care X- 1 10 Staff obtains Programmer, systems, Records, Test Results / Process Orders _10  - 0 Staff telephones HHA, Nursing Homes / Clarify orders / etc _11  - 0 Routine Transfer to another Facility (non-emergent condition) _12  - 0 Routine Hospital Admission (non-emergent condition) _13  - 0 New Admissions / Biomedical engineer / Ordering NPWT, Apligraf, etc. _14  - 0 Emergency Hospital Admission (emergent condition) X- 1 10 Simple Discharge Coordination _15  - 0 Complex (extensive) Discharge Coordination PROCESS - Special Needs _16  - Pediatric / Minor Patient Management 0 _17  - 0 Isolation Patient Management _18  - 0 Hearing / Language / Visual special needs _19  - 0 Assessment of Community assistance (transportation, D/C planning, etc.) _20  - 0 Additional assistance / Altered mentation _21  - 0 Support Surface(s) Assessment (bed, cushion, seat, etc.) INTERVENTIONS - Wound Cleansing / Measurement Esters, Xavion T. ([id number]) X- 1 5 Simple Wound Cleansing - one wound _22  -  0 Complex Wound Cleansing - multiple wounds X- 1 5 Wound Imaging (photographs - any number of wounds) _23  - 0 Wound Tracing (instead of photographs) X-  1 5 Simple Wound Measurement - one wound _0  - 0 Complex Wound Measurement - multiple wounds INTERVENTIONS - Wound Dressings _1  - Small Wound Dressing one or multiple wounds 0 X- 1 15 Medium Wound Dressing one or multiple wounds _2  - 0 Large Wound Dressing one or multiple wounds <JXBJYNWGNFAOZHYQ>_6<\/VHQIONGEXBMWUXLK>_4  - 0 Application of Medications - topical <MWNUUVOZDGUYQIHK>_7<\/QQVZDGLOVFIEPPIR>_5  - 0 Application of Medications - injection INTERVENTIONS - Miscellaneous _5  - External ear exam 0 _6  - 0 Specimen Collection (cultures, biopsies, blood, body fluids, etc.) _7  - 0 Specimen(s) / Culture(s) sent or taken to Lab for analysis _8  - 0 Patient Transfer (multiple staff / Civil Service fast streamer / Similar devices) _9  - 0 Simple Staple / Suture removal (25 or less) _10  - 0 Complex Staple / Suture removal (26 or more) _11  - 0 Hypo / Hyperglycemic Management (close monitor of Blood Glucose) _12  - 0 Ankle / Brachial Index (ABI) - do not check if billed separately X- 1 5 Vital Signs Has the patient been seen at the hospital within the last three years: Yes Total Score: 90 Level Of Care: New/Established - Level 3 Electronic Signature(s) Signed: 10/28/2021 4:13:59 PM By: Massie Kluver Entered By: Massie Kluver on 10/25/2021 11:19:48 Baxley, Braylin T. ([id number]) -------------------------------------------------------------------------------- Encounter Discharge Information Details Patient Name: [name], Mackenzie T. Date of Service: 10/25/2021 10:45 AM Medical Record Number: [id number] Patient Account Number: [id number] Date of Birth/Sex: 03-Aug-1940 (81 y.o. M) Treating RN: Cornell Barman Primary Care Isiaah Cuervo: Owens Loffler Other Clinician: Massie Kluver Referring Jermani Eberlein: Owens Loffler Treating Riese Hellard/Extender: Skipper Cliche in Treatment: 2 Encounter Discharge Information Items Discharge Condition:  Stable Ambulatory Status: Ambulatory Discharge Destination: Home Transportation: Private Auto Accompanied By: wife Schedule Follow-up Appointment: Yes Clinical Summary of Care: Electronic Signature(s) Signed: 10/28/2021 4:13:59 PM By: Massie Kluver Entered By: Massie Kluver on 10/25/2021 11:29:13 Quintero, Dvon T. ([id number]) -------------------------------------------------------------------------------- Lower Extremity Assessment Details Patient Name: Schloemer, Micaiah T. Date of Service: 10/25/2021 10:45 AM Medical Record Number: [id number] Patient Account Number: [id number] Date of Birth/Sex: 05-13-1940 (81 y.o. M) Treating RN: Cornell Barman Primary Care Brently Voorhis: Owens Loffler Other Clinician: Massie Kluver Referring Silva Aamodt: Owens Loffler Treating Na Waldrip/Extender: Skipper Cliche in Treatment: 2 Electronic Signature(s) Signed: 10/25/2021 2:38:00 PM By: Gretta Cool BSN, RN, CWS, Kim RN, BSN Signed: 10/28/2021 4:13:59 PM By: Massie Kluver Entered By: Massie Kluver on 10/25/2021 11:05:13 Seaman, Kenyen T. ([id number]) -------------------------------------------------------------------------------- Multi Wound Chart Details Patient Name: Duffy, Dewaun T. Date of Service: 10/25/2021 10:45 AM Medical Record Number: [id number] Patient Account Number: [id number] Date of Birth/Sex: 1940/08/23 (81 y.o. M) Treating RN: Cornell Barman Primary Care Isatou Agredano: Owens Loffler Other Clinician: Massie Kluver Referring Jaselle Pryer: Owens Loffler Treating Trashaun Streight/Extender: Skipper Cliche in Treatment: 2 Vital Signs Height(in): 69 Pulse(bpm): 47 Weight(lbs): 174 Blood Pressure(mmHg): 145/68 Body Mass Index(BMI): 25.7 Temperature(F): 97.6 Respiratory Rate(breaths/min): 16 Photos: [N/A:N/A] Wound Location: Left, Medial Ankle N/A N/A Wounding Event: Gradually Appeared N/A N/A Primary Etiology: Venous Leg Ulcer N/A N/A Comorbid History: Coronary Artery Disease, Deep Vein N/A N/A Thrombosis,  Hypertension, Peripheral Arterial Disease Date Acquired: 08/23/2021 N/A N/A Weeks of Treatment: 2 N/A N/A Wound Status: Open N/A N/A Wound Recurrence: No N/A N/A Measurements L x W x D (cm) 0.5x0.3x0.2 N/A N/A Area (cm) : 0.118 N/A N/A Volume (cm) : 0.024 N/A N/A % Reduction in Area: 39.80% N/A N/A % Reduction in Volume: -20.00% N/A N/A Classification: Full Thickness Without Exposed N/A N/A Support Structures Exudate Amount: Medium N/A N/A Exudate Type: Serosanguineous N/A N/A Exudate Color: red, brown N/A N/A Granulation Amount:  Medium (34-66%) N/A N/A Granulation Quality: Red N/A N/A Necrotic Amount: Medium (34-66%) N/A N/A Exposed Structures: Fat Layer (Subcutaneous Tissue): N/A N/A Yes Fascia: No Tendon: No Muscle: No Joint: No Bone: No Epithelialization: None N/A N/A Treatment Notes Electronic Signature(s) Signed: 10/28/2021 4:13:59 PM By: Massie Kluver Entered By: Massie Kluver on 10/25/2021 11:05:37 Phung, Desmond T. ([id number]) -------------------------------------------------------------------------------- Multi-Disciplinary Care Plan Details Patient Name: [name], Grayling T. Date of Service: 10/25/2021 10:45 AM Medical Record Number: [id number] Patient Account Number: [id number] Date of Birth/Sex: Oct 17, 1940 (81 y.o. M) Treating RN: Cornell Barman Primary Care Zoe Goonan: Owens Loffler Other Clinician: Massie Kluver Referring Julee Stoll: Owens Loffler Treating Lashona Schaaf/Extender: Skipper Cliche in Treatment: 2 Active Inactive Wound/Skin Impairment Nursing Diagnoses: Knowledge deficit related to ulceration/compromised skin integrity Goals: Patient/caregiver will verbalize understanding of skin care regimen Date Initiated: 10/05/2021 Target Resolution Date: 11/04/2021 Goal Status: Active Ulcer/skin breakdown will have a volume reduction of 30% by week 4 Date Initiated: 10/05/2021 Target Resolution Date: 11/04/2021 Goal Status: Active Ulcer/skin breakdown will  have a volume reduction of 50% by week 8 Date Initiated: 10/05/2021 Target Resolution Date: 12/05/2021 Goal Status: Active Ulcer/skin breakdown will have a volume reduction of 80% by week 12 Date Initiated: 10/05/2021 Target Resolution Date: 01/05/2022 Goal Status: Active Ulcer/skin breakdown will heal within 14 weeks Date Initiated: 10/05/2021 Target Resolution Date: 02/04/2022 Goal Status: Active Interventions: Assess patient/caregiver ability to obtain necessary supplies Assess patient/caregiver ability to perform ulcer/skin care regimen upon admission and as needed Assess ulceration(s) every visit Notes: Electronic Signature(s) Signed: 10/25/2021 2:38:00 PM By: Gretta Cool, BSN, RN, CWS, Kim RN, BSN Signed: 10/28/2021 4:13:59 PM By: Massie Kluver Entered By: Massie Kluver on 10/25/2021 11:05:17 Baynes, Jahi T. ([id number]) -------------------------------------------------------------------------------- Pain Assessment Details Patient Name: Xia, Nicklous T. Date of Service: 10/25/2021 10:45 AM Medical Record Number: [id number] Patient Account Number: [id number] Date of Birth/Sex: 27-Aug-1940 (81 y.o. M) Treating RN: Cornell Barman Primary Care Ailis Rigaud: Owens Loffler Other Clinician: Massie Kluver Referring Margene Cherian: Owens Loffler Treating Shaterria Sager/Extender: Skipper Cliche in Treatment: 2 Active Problems Location of Pain Severity and Description of Pain Patient Has Paino Yes Site Locations Pain Location: Pain in Ulcers With Dressing Change: No Duration of the Pain. Constant / Intermittento Intermittent Rate the pain. Current Pain Level: 1 Character of Pain Describe the Pain: Aching, Stabbing Pain Management and Medication Current Pain Management: Medication: No Rest: Yes Electronic Signature(s) Signed: 10/25/2021 2:38:00 PM By: Gretta Cool, BSN, RN, CWS, Kim RN, BSN Signed: 10/28/2021 4:13:59 PM By: Massie Kluver Entered By: Massie Kluver on 10/25/2021 10:57:17 Raschke, Blaine T.  ([id number]) -------------------------------------------------------------------------------- Patient/Caregiver Education Details Patient Name: [name], Remigio T. Date of Service: 10/25/2021 10:45 AM Medical Record Number: [id number] Patient Account Number: [id number] Date of Birth/Gender: Nov 23, 1940 (81 y.o. M) Treating RN: Cornell Barman Primary Care Physician: Owens Loffler Other Clinician: Massie Kluver Referring Physician: Owens Loffler Treating Physician/Extender: Skipper Cliche in Treatment: 2 Education Assessment Education Provided To: Patient Education Topics Provided Wound/Skin Impairment: Handouts: Other: continue wound care as directed Methods: Explain/Verbal Responses: State content correctly Electronic Signature(s) Signed: 10/28/2021 4:13:59 PM By: Massie Kluver Entered By: Massie Kluver on 10/25/2021 11:20:16 Royse, Kandon T. ([id number]) -------------------------------------------------------------------------------- Wound Assessment Details Patient Name: Hagwood, Shanta T. Date of Service: 10/25/2021 10:45 AM Medical Record Number: [id number] Patient Account Number: [id number] Date of Birth/Sex: [birth year] (81 y.o. M) Treating RN: Cornell Barman Primary Care Antero Derosia: Owens Loffler Other Clinician: Massie Kluver Referring Monda Chastain: Owens Loffler Treating Ranald Alessio/Extender: Skipper Cliche in Treatment: 2 Wound Status Wound Number: 1 Primary Venous Leg Ulcer  Etiology: Wound Location: Left, Medial Ankle Wound Open Wounding Event: Gradually Appeared Status: Date Acquired: 08/23/2021 Comorbid Coronary Artery Disease, Deep Vein Thrombosis, Weeks Of Treatment: 2 History: Hypertension, Peripheral Arterial Disease Clustered Wound: No Photos Wound Measurements Length: (cm) 0.5 Width: (cm) 0.3 Depth: (cm) 0.1 Area: (cm) 0.118 Volume: (cm) 0.012 % Reduction in Area: 39.8% % Reduction in Volume: 40% Epithelialization: None Wound Description Classification:  Full Thickness Without Exposed Support Structures Exudate Amount: Medium Exudate Type: Serosanguineous Exudate Color: red, brown Foul Odor After Cleansing: No Slough/Fibrino Yes Wound Bed Granulation Amount: Medium (34-66%) Exposed Structure Granulation Quality: Red Fascia Exposed: No Necrotic Amount: Medium (34-66%) Fat Layer (Subcutaneous Tissue) Exposed: Yes Tendon Exposed: No Muscle Exposed: No Joint Exposed: No Bone Exposed: No Treatment Notes Wound #1 (Ankle) Wound Laterality: Left, Medial Cleanser [place] Ancillary Kit - 15 Day Supply Discharge Instruction: Use supplies as instructed; Kit contains: (15) Saline Bullets; (15) 3x3 Gauze; 15 pr Gloves Peri-Wound Care Russman, Nazareth T. ([id number]) Topical Gentamicin Discharge Instruction: mix with TCA and apply to peri wound a nd wound bed Triamcinolone Acetonide Cream, 0.1%, 15 (g) tube Discharge Instruction: mix with gentamycin and apply to peri wound a nd wound bed Primary Dressing Hydrofera Blue Ready Transfer Foam, 2.5x2.5 (in/in) Discharge Instruction: Apply Hydrofera Blue Ready to wound bed as directed Secondary Dressing (SILICONE BORDER) Zetuvit Plus SILICONE BORDER Dressing 4x4 (in/in) Discharge Instruction: Please do not put silicone bordered dressings under wraps. Use non-bordered dressing only. Secured With Compression Wrap Compression Stockings Add-Ons tubi grip D Discharge Instruction: double Hydrologist) Signed: 10/25/2021 2:38:00 PM By: Gretta Cool, BSN, RN, CWS, Kim RN, BSN Signed: 10/28/2021 4:13:59 PM By: Massie Kluver Entered By: Massie Kluver on 10/25/2021 11:17:52 Ambriz, Yadiel T. ([id number]) -------------------------------------------------------------------------------- Vitals Details Patient Name: Buddenhagen, Filippo T. Date of Service: 10/25/2021 10:45 AM Medical Record Number: [id number] Patient Account Number: [id number] Date of Birth/Sex: 02-08-1941 (81 y.o. M) Treating RN: Cornell Barman Primary Care Zaira Iacovelli: Owens Loffler Other Clinician: Massie Kluver Referring Facundo Allemand: Owens Loffler Treating Pheng Prokop/Extender: Skipper Cliche in Treatment: 2 Vital Signs Time Taken: 10:54 Temperature (F): 97.6 Height (in): 69 Pulse (bpm): 82 Weight (lbs): 174 Respiratory Rate (breaths/min): 16 Body Mass Index (BMI): 25.7 Blood Pressure (mmHg): 145/68 Reference Range: 80 - 120 mg / dl Electronic Signature(s) Signed: 10/28/2021 4:13:59 PM By: Massie Kluver Entered By: Massie Kluver on 10/25/2021 10:57:08

## 2021-11-02 ENCOUNTER — Encounter: Payer: Medicare Other | Admitting: Physician Assistant

## 2021-11-02 DIAGNOSIS — L97328 Non-pressure chronic ulcer of left ankle with other specified severity: Secondary | ICD-10-CM | POA: Diagnosis not present

## 2021-11-02 DIAGNOSIS — I70212 Atherosclerosis of native arteries of extremities with intermittent claudication, left leg: Secondary | ICD-10-CM | POA: Diagnosis not present

## 2021-11-02 DIAGNOSIS — L97322 Non-pressure chronic ulcer of left ankle with fat layer exposed: Secondary | ICD-10-CM | POA: Diagnosis not present

## 2021-11-02 DIAGNOSIS — I87332 Chronic venous hypertension (idiopathic) with ulcer and inflammation of left lower extremity: Secondary | ICD-10-CM | POA: Diagnosis not present

## 2021-11-02 NOTE — Progress Notes (Addendum)
[name] ([id number]) Visit Report for 11/02/2021 Chief Complaint Document Details Patient Name: [name], [name] Date of Service: 11/02/2021 10:15 AM Medical Record Number: [id number] Patient Account Number: [id number] Date of Birth/Sex: [birth year] (81 y.o. M) Treating RN: [name] Primary Care Provider: Owens [name] Other Clinician: Referring Provider: Owens [name] Treating Provider/Extender: [name] in Treatment: 4 Information Obtained from: Patient Chief Complaint 10/05/2021; patient is here for review of a wound on the left medial ankle Electronic Signature(s) Signed: 11/02/2021 10:26:14 AM By: [name] Keeler PA-C Entered By: [name] on 11/02/2021 10:26:14 [name], [name] ([id number]) -------------------------------------------------------------------------------- HPI Details Patient Name: [name], [name] Date of Service: 11/02/2021 10:15 AM Medical Record Number: [id number] Patient Account Number: [id number] Date of Birth/Sex: Feb 08, 1941 (81 y.o. M) Treating RN: [name] Primary Care Provider: Owens [name] Other Clinician: Referring Provider: Owens [name] Treating Provider/Extender: [name] in Treatment: 4 History of Present Illness HPI Description: ADMISSION 10/05/2021 This is an 81 year old man who comes to see [place] for a wound on his left medial ankle that is been there for about a month. He has not been dressing this specifically. The patient is known to have both chronic venous insufficiency and severe arterial insufficiency. For the latter he follows with Dr. Fletcher [name]. He has had extensive stenting including bilateral common iliac arteries, left renal artery and stenting in the left upper extremity. He has a remote smoking history and a lot of medical comorbidities but he is not a diabetic. He tells me that the wound on his left medial ankle started about a month ago or he noticed it about a month ago there was no trauma  no bleeding no infection that he is aware of not able to tell me exactly how this would have started. Finds it to be quite painful Past medical history is really quite extensive and includes ischemic cardiomyopathy, COPD, peripheral arterial disease, renal artery stenosis, history of a DVT, chronic kidney disease stage IIIb, hypertension, chronic lower extremity edema, diastolic heart failure. His last arteriogram was in January of this year. He was not felt to have any good endovascular options and he is not felt to be a good surgical candidate. He takes cilostazol for the intermittent claudication. Last noninvasive studies were in December 2022 at which time his ABI in the right was 0.61 with dampened monophasic wounds. They could not obtain a great toe pressure on the left ABI at 0.56 with a TBI of 0.23 dampened monophasic waveforms 10-12-2021 upon evaluation today patient appears to be doing well with regard to his wounds. He was seen by Dr. Dellia [name] last week. The good news is he is actually showing signs of improvement compared to last week and overall very pleased in that regard. Fortunately there does not appear to be any signs of active infection locally or systemically which is great news. No fevers, chills, nausea, vomiting, or diarrhea. 10-19-2021 upon evaluation today patient appears to be doing well currently in regard to his wound all things considered he still is having quite significant discomfort but again I do believe that he is showing some signs of improvement unfortunately he is also having an issue with a significant rash which is yet to be fully diagnosed I believe. To be honest looking at it today and discussing the way things are going I really feel like he may have some psoriasis believe the palm involvement which is generally not something we see with just eczema or atopic dermatitis. Nonetheless all this  was discussed with the patient out the details of this and the plan as  well. Either way I do believe that the patient is going to require further dermatologic follow-up and treatment and he actually has an appointment with a second opinion dermatologist tomorrow in [place]. 10-25-2021 upon evaluation patient appears to be doing well with regard to his wound. This actually showed signs of significant improvement which is great news. He did go see the dermatologist last week they feel that his issue probably is a staph infection. They did a swab we do not know if this is MRSA or just general staph at this point. He was placed on doxycycline however and is doing significantly better. There is no signs of active infection locally or systemically at this time. 11-02-2021 upon evaluation today patient appears to be doing well with regard to his wound. He has been tolerating the dressing changes without complication. Fortunately I think we are on the right track here. There does not appear to be any evidence of active infection locally or systemically at this time. Electronic Signature(s) Signed: 11/02/2021 10:36:42 AM By: [name] Keeler PA-C Entered By: [name] on 11/02/2021 10:36:42 [name], [name] ([id number]) -------------------------------------------------------------------------------- Physical Exam Details Patient Name: [name], [name] Date of Service: 11/02/2021 10:15 AM Medical Record Number: [id number] Patient Account Number: [id number] Date of Birth/Sex: Jan 22, 1941 (81 y.o. M) Treating RN: [name] Primary Care Provider: Owens [name] Other Clinician: Referring Provider: Owens [name] Treating Provider/Extender: [name] in Treatment: 4 Constitutional Well-nourished and well-hydrated in no acute distress. Respiratory normal breathing without difficulty. Psychiatric this patient is able to make decisions and demonstrates good insight into disease process. Alert and Oriented x 3. pleasant and cooperative. Notes Upon inspection  patient's wound bed showed signs of good granulation and epithelization at this point. Fortunately I think that we are on the right track and I do believe that he is making good progress here. I do not think that there is any signs of infection and overall I am extremely pleased in that regard Electronic Signature(s) Signed: 11/02/2021 10:36:59 AM By: [name] Keeler PA-C Entered By: [name] on 11/02/2021 10:36:58 [name], [name] ([id number]) -------------------------------------------------------------------------------- Physician Orders Details Patient Name: [name], [name] Date of Service: 11/02/2021 10:15 AM Medical Record Number: [id number] Patient Account Number: [id number] Date of Birth/Sex: December 03, 1940 (81 y.o. M) Treating RN: [name] Primary Care Provider: Owens [name] Other Clinician: Referring Provider: Owens [name] Treating Provider/Extender: [name] in Treatment: 4 Verbal / Phone Orders: No Diagnosis Coding Follow-up Appointments o Return Appointment in 1 week. Bathing/ Shower/ Hygiene o May shower; gently cleanse wound with antibacterial soap, rinse and pat dry prior to dressing wounds Edema Control - Lymphedema / Segmental Compressive Device / Other o Tubigrip double layer applied - size D o Elevate, Exercise Daily and Avoid Standing for Long Periods of Time. o Elevate legs to the level of the heart and pump ankles as often as possible o Elevate leg(s) parallel to the floor when sitting. Wound Treatment Wound #1 - Ankle Wound Laterality: Left, Medial Cleanser: Byram Ancillary Kit - 15 Day Supply (Generic) 3 x Per Week/30 Days Discharge Instructions: Use supplies as instructed; Kit contains: (15) Saline Bullets; (15) 3x3 Gauze; 15 pr Gloves Topical: Gentamicin 3 x Per Week/30 Days Discharge Instructions: mix with TCA and apply to peri wound a nd wound bed Topical: Triamcinolone Acetonide Cream, 0.1%, 15 (g) tube 3 x Per Week/30  Days Discharge Instructions: mix with  gentamycin and apply to peri wound a nd wound bed Primary Dressing: Hydrofera Blue Ready Transfer Foam, 2.5x2.5 (in/in) 3 x Per Week/30 Days Discharge Instructions: Apply Hydrofera Blue Ready to wound bed as directed Secondary Dressing: (SILICONE BORDER) Zetuvit Plus SILICONE BORDER Dressing 4x4 (in/in) (Generic) 3 x Per Week/30 Days Discharge Instructions: Please do not put silicone bordered dressings under wraps. Use non-bordered dressing only. Add-Ons: tubi grip D 3 x Per Week/30 Days Discharge Instructions: double layer Electronic Signature(s) Signed: 11/04/2021 4:35:29 PM By: [name] Keeler PA-C Signed: 11/05/2021 10:45:13 AM By: [name] Coria RN Entered By: [name] on 11/02/2021 10:33:45 [name], [name] ([id number]) -------------------------------------------------------------------------------- Problem List Details Patient Name: [name], [name] Date of Service: 11/02/2021 10:15 AM Medical Record Number: [id number] Patient Account Number: [id number] Date of Birth/Sex: January 16, 1941 (81 y.o. M) Treating RN: [name] Primary Care Provider: Owens [name] Other Clinician: Referring Provider: Owens [name] Treating Provider/Extender: [name] in Treatment: 4 Active Problems ICD-10 Encounter Code Description Active Date MDM Diagnosis I87.332 Chronic venous hypertension (idiopathic) with ulcer and inflammation of 10/05/2021 No Yes left lower extremity L97.328 Non-pressure chronic ulcer of left ankle with other specified severity 10/05/2021 No Yes I70.212 Atherosclerosis of native arteries of extremities with intermittent 10/05/2021 No Yes claudication, left leg Inactive Problems Resolved Problems Electronic Signature(s) Signed: 11/02/2021 10:26:08 AM By: [name] Keeler PA-C Entered By: [name] on 11/02/2021 10:26:08 [name], [name]  ([id number]) -------------------------------------------------------------------------------- Progress Note Details Patient Name: [name], [name] Date of Service: 11/02/2021 10:15 AM Medical Record Number: [id number] Patient Account Number: [id number] Date of Birth/Sex: 1940-08-12 (81 y.o. M) Treating RN: [name] Primary Care Provider: Owens [name] Other Clinician: Referring Provider: Owens [name] Treating Provider/Extender: [name] in Treatment: 4 Subjective Chief Complaint Information obtained from Patient 10/05/2021; patient is here for review of a wound on the left medial ankle History of Present Illness (HPI) ADMISSION 10/05/2021 This is an 81 year old man who comes to see [place] for a wound on his left medial ankle that is been there for about a month. He has not been dressing this specifically. The patient is known to have both chronic venous insufficiency and severe arterial insufficiency. For the latter he follows with Dr. Fletcher [name]. He has had extensive stenting including bilateral common iliac arteries, left renal artery and stenting in the left upper extremity. He has a remote smoking history and a lot of medical comorbidities but he is not a diabetic. He tells me that the wound on his left medial ankle started about a month ago or he noticed it about a month ago there was no trauma no bleeding no infection that he is aware of not able to tell me exactly how this would have started. Finds it to be quite painful Past medical history is really quite extensive and includes ischemic cardiomyopathy, COPD, peripheral arterial disease, renal artery stenosis, history of a DVT, chronic kidney disease stage IIIb, hypertension, chronic lower extremity edema, diastolic heart failure. His last arteriogram was in January of this year. He was not felt to have any good endovascular options and he is not felt to be a good surgical candidate. He takes cilostazol for the intermittent  claudication. Last noninvasive studies were in December 2022 at which time his ABI in the right was 0.61 with dampened monophasic wounds. They could not obtain a great toe pressure on the left ABI at 0.56 with a TBI of 0.23 dampened monophasic waveforms 10-12-2021 upon evaluation today patient appears to be doing well  with regard to his wounds. He was seen by Dr. Dellia [name] last week. The good news is he is actually showing signs of improvement compared to last week and overall very pleased in that regard. Fortunately there does not appear to be any signs of active infection locally or systemically which is great news. No fevers, chills, nausea, vomiting, or diarrhea. 10-19-2021 upon evaluation today patient appears to be doing well currently in regard to his wound all things considered he still is having quite significant discomfort but again I do believe that he is showing some signs of improvement unfortunately he is also having an issue with a significant rash which is yet to be fully diagnosed I believe. To be honest looking at it today and discussing the way things are going I really feel like he may have some psoriasis believe the palm involvement which is generally not something we see with just eczema or atopic dermatitis. Nonetheless all this was discussed with the patient out the details of this and the plan as well. Either way I do believe that the patient is going to require further dermatologic follow-up and treatment and he actually has an appointment with a second opinion dermatologist tomorrow in [place]. 10-25-2021 upon evaluation patient appears to be doing well with regard to his wound. This actually showed signs of significant improvement which is great news. He did go see the dermatologist last week they feel that his issue probably is a staph infection. They did a swab we do not know if this is MRSA or just general staph at this point. He was placed on doxycycline however and is  doing significantly better. There is no signs of active infection locally or systemically at this time. 11-02-2021 upon evaluation today patient appears to be doing well with regard to his wound. He has been tolerating the dressing changes without complication. Fortunately I think we are on the right track here. There does not appear to be any evidence of active infection locally or systemically at this time. Objective Constitutional Well-nourished and well-hydrated in no acute distress. Vitals Time Taken: 10:17 AM, Height: 69 in, Weight: 174 lbs, BMI: 25.7, Temperature: 97.8 F, Pulse: 77 bpm, Respiratory Rate: 16 breaths/min, Blood Pressure: 136/112 mmHg. Respiratory [name], [name] ([id number]) normal breathing without difficulty. Psychiatric this patient is able to make decisions and demonstrates good insight into disease process. Alert and Oriented x 3. pleasant and cooperative. General Notes: Upon inspection patient's wound bed showed signs of good granulation and epithelization at this point. Fortunately I think that we are on the right track and I do believe that he is making good progress here. I do not think that there is any signs of infection and overall I am extremely pleased in that regard Integumentary (Hair, Skin) Wound #1 status is Open. Original cause of wound was Gradually Appeared. The date acquired was: 08/23/2021. The wound has been in treatment 4 weeks. The wound is located on the Left,Medial Ankle. The wound measures 0.2cm length x 0.2cm width x 0.1cm depth; 0.031cm^2 area and 0.003cm^3 volume. There is Fat Layer (Subcutaneous Tissue) exposed. There is no tunneling or undermining noted. There is a medium amount of serosanguineous drainage noted. There is medium (34-66%) red granulation within the wound bed. There is a medium (34-66%) amount of necrotic tissue within the wound bed. 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 Bathing/ Shower/ Hygiene: May shower; gently cleanse wound with antibacterial soap, rinse and pat dry prior to dressing wounds Edema Control - Lymphedema / Segmental Compressive Device / Other: Tubigrip double layer applied - size D Elevate, Exercise Daily and Avoid Standing for Long Periods of Time. Elevate legs to the level of the heart and pump ankles as often as possible Elevate leg(s) parallel to the floor when sitting. WOUND #1: - Ankle Wound Laterality: Left, Medial Cleanser: Byram Ancillary Kit - 15 Day Supply (Generic) 3 x Per Week/30 Days Discharge Instructions: Use supplies as instructed; Kit contains: (15) Saline Bullets; (15) 3x3 Gauze; 15 pr Gloves Topical: Gentamicin 3 x Per Week/30 Days Discharge Instructions: mix with TCA and apply to peri wound a nd wound bed Topical: Triamcinolone Acetonide Cream, 0.1%, 15 (g) tube 3 x Per Week/30 Days Discharge Instructions: mix with gentamycin and apply to peri wound a nd wound bed Primary Dressing: Hydrofera Blue Ready Transfer Foam, 2.5x2.5 (in/in) 3 x Per Week/30 Days Discharge Instructions: Apply Hydrofera Blue Ready to wound bed as directed Secondary Dressing: (SILICONE BORDER) Zetuvit Plus SILICONE BORDER Dressing 4x4 (in/in) (Generic) 3 x Per Week/30 Days Discharge Instructions: Please do not put silicone bordered dressings under wraps. Use non-bordered dressing only. Add-Ons: tubi grip D 3 x Per Week/30 Days Discharge Instructions: double layer 1. I am going to suggest that we go ahead and continue with the wound care measures as before and the patient is in agreement with plan. This includes the use of the triamcinolone followed by the [facility] which I do believe  is doing quite well. 2. I am also can recommend that we have the patient continue with the bordered foam dressing to cover. 3. I would also suggest that he continue with the Tubigrip which is helping with edema control. We will see patient back for reevaluation in 1 week here in the clinic. If anything worsens or changes patient will contact our office for additional recommendations. Electronic Signature(s) Signed: 11/02/2021 10:37:30 AM By: [name] Keeler PA-C Entered By: [name] on 11/02/2021 10:37:30 [name], [name] ([id number]) [name], [name] ([id number]) -------------------------------------------------------------------------------- SuperBill Details Patient Name: [name], Akiem T. Date of Service: 11/02/2021 Medical Record Number: [id number] Patient Account Number: [id number] Date of Birth/Sex: Oct 17, 1940 (81 y.o. M) Treating RN: [name] Primary Care Provider: Owens [name] Other Clinician: Referring Provider: Owens [name] Treating Provider/Extender: [name] in Treatment: 4 Diagnosis Coding ICD-10 Codes Code Description [phone] Chronic venous hypertension (idiopathic) with ulcer and inflammation of left lower extremity L97.328 Non-pressure chronic ulcer of left ankle with other specified severity I70.212 Atherosclerosis of native arteries of extremities with intermittent claudication, left leg Facility Procedures CPT4 Code: [id number] Description: [id number] - WOUND CARE VISIT-LEV 2 EST PT Modifier: Quantity: 1 Physician Procedures CPT4 Code Description: [id number] - WC PHYS LEVEL 4 - EST PT Modifier: Quantity: 1 CPT4 Code Description: ICD-10 Diagnosis Description I87.332 Chronic venous hypertension (idiopathic) with ulcer and inflammation of left L97.328 Non-pressure chronic ulcer of left ankle with other specified severity I70.212 Atherosclerosis of native  arteries of extremities with intermittent claudica Modifier: lower extremity tion, left  leg Quantity: Electronic Signature(s) Signed: 11/02/2021 10:37:44 AM By: [name] Keeler PA-C Entered By: [name] on 11/02/2021 10:37:44

## 2021-11-04 ENCOUNTER — Ambulatory Visit (INDEPENDENT_AMBULATORY_CARE_PROVIDER_SITE_OTHER): Payer: Medicare Other

## 2021-11-04 DIAGNOSIS — Z7901 Long term (current) use of anticoagulants: Secondary | ICD-10-CM

## 2021-11-04 LAB — POCT INR: INR: 2 (ref 2.0–3.0)

## 2021-11-04 NOTE — Patient Instructions (Addendum)
Pre visit review using our clinic review tool, if applicable. No additional management support is needed unless otherwise documented below in the visit note.  Continue 1/2 tablet daily except take 1 tablet on Sundays.  Recheck in 1 week.

## 2021-11-04 NOTE — Progress Notes (Signed)
Pt has been started on doxycycline, 100 mg, x 4 weeks for staph infection. Pt in today to monitor for interactions.  Continue 1/2 tablet daily except take 1 tablet on Sundays.  Recheck in 1 week.   Discussed with pt if INR is subtherapeutic or on the lower side of his range we will likely add another 1/2 tablet to his weekly dose until he is finished with the abx.

## 2021-11-05 NOTE — Progress Notes (Signed)
PRATT, BRESS ([id number]) Visit Report for 11/02/2021 Arrival Information Details Patient Name: [name], [name] Date of Service: 11/02/2021 10:15 AM Medical Record Number: [id number] Patient Account Number: [id number] Date of Birth/Sex: Feb 18, 1941 (81 y.o. M) Treating RN: Carlene Coria Primary Care Jenness Stemler: Owens Loffler Other Clinician: Referring Tanzie Rothschild: Owens Loffler Treating Netasha Wehrli/Extender: Skipper Cliche in Treatment: 4 Visit Information History Since Last Visit All ordered tests and consults were completed: No Patient Arrived: Ambulatory Added or deleted any medications: No Arrival Time: 10:12 Any new allergies or adverse reactions: No Accompanied By: wife Had a fall or experienced change in No Transfer Assistance: None activities of daily living that may affect Patient Identification Verified: Yes risk of falls: Secondary Verification Process Completed: Yes Signs or symptoms of abuse/neglect since last visito No Patient Requires Transmission-Based No Hospitalized since last visit: No Precautions: Implantable device outside of the clinic excluding No Patient Has Alerts: Yes cellular tissue based products placed in the center Patient Alerts: Patient on Blood since last visit: Thinner Has Dressing in Place as Prescribed: Yes L ABI .56 04/22/21 Has Compression in Place as Prescribed: Yes R ABI .61 04/22/21 Pain Present Now: No Electronic Signature(s) Signed: 11/05/2021 10:45:13 AM By: Carlene Coria RN Entered By: Carlene Coria on 11/02/2021 10:17:43 Huser, Kiandre T. ([id number]) -------------------------------------------------------------------------------- Clinic Level of Care Assessment Details Patient Name: [name], [name] Date of Service: 11/02/2021 10:15 AM Medical Record Number: [id number] Patient Account Number: [id number] Date of Birth/Sex: 1940-08-05 (81 y.o. M) Treating RN: Carlene Coria Primary Care Curren Mohrmann: Owens Loffler Other  Clinician: Referring Sanaz Scarlett: Owens Loffler Treating Miarose Lippert/Extender: Skipper Cliche in Treatment: 4 Clinic Level of Care Assessment Items TOOL 4 Quantity Score X - Use when only an EandM is performed on FOLLOW-UP visit 1 0 ASSESSMENTS - Nursing Assessment / Reassessment X - Reassessment of Co-morbidities (includes updates in patient status) 1 10 X- 1 5 Reassessment of Adherence to Treatment Plan ASSESSMENTS - Wound and Skin Assessment / Reassessment X - Simple Wound Assessment / Reassessment - one wound 1 5 _0  - 0 Complex Wound Assessment / Reassessment - multiple wounds _1  - 0 Dermatologic / Skin Assessment (not related to wound area) ASSESSMENTS - Focused Assessment _2  - Circumferential Edema Measurements - multi extremities 0 _3  - 0 Nutritional Assessment / Counseling / Intervention _4  - 0 Lower Extremity Assessment (monofilament, tuning fork, pulses) _5  - 0 Peripheral Arterial Disease Assessment (using hand held doppler) ASSESSMENTS - Ostomy and/or Continence Assessment and Care _6  - Incontinence Assessment and Management 0 _7  - 0 Ostomy Care Assessment and Management (repouching, etc.) PROCESS - Coordination of Care X - Simple Patient / Family Education for ongoing care 1 15 _8  - 0 Complex (extensive) Patient / Family Education for ongoing care _9  - 0 Staff obtains Programmer, systems, Records, Test Results / Process Orders _10  - 0 Staff telephones HHA, Nursing Homes / Clarify orders / etc _11  - 0 Routine Transfer to another Facility (non-emergent condition) _12  - 0 Routine Hospital Admission (non-emergent condition) _13  - 0 New Admissions / Biomedical engineer / Ordering NPWT, Apligraf, etc. _14  - 0 Emergency Hospital Admission (emergent condition) X- 1 10 Simple Discharge Coordination _15  - 0 Complex (extensive) Discharge Coordination PROCESS - Special Needs _16  - Pediatric / Minor Patient Management 0 _17  - 0 Isolation Patient Management _18  - 0 Hearing /  Language / Visual special needs _19  - 0 Assessment of Community assistance (transportation, D/C planning, etc.) _20  - 0 Additional assistance / Altered mentation _21  - 0 Support Surface(s) Assessment (  bed, cushion, seat, etc.) INTERVENTIONS - Wound Cleansing / Measurement Heydt, Dallyn T. ([id number]) X- 1 5 Simple Wound Cleansing - one wound _0  - 0 Complex Wound Cleansing - multiple wounds X- 1 5 Wound Imaging (photographs - any number of wounds) _1  - 0 Wound Tracing (instead of photographs) X- 1 5 Simple Wound Measurement - one wound _2  - 0 Complex Wound Measurement - multiple wounds INTERVENTIONS - Wound Dressings X - Small Wound Dressing one or multiple wounds 1 10 _3  - 0 Medium Wound Dressing one or multiple wounds _4  - 0 Large Wound Dressing one or multiple wounds <XBWIOMBTDHRCBULA>_4<\/TXMIWOEHOZYYQMGN>_0  - 0 Application of Medications - topical <IBBCWUGQBVQXIHWT>_8<\/UEKCMKLKJZPHXTAV>_6  - 0 Application of Medications - injection INTERVENTIONS - Miscellaneous _7  - External ear exam 0 _8  - 0 Specimen Collection (cultures, biopsies, blood, body fluids, etc.) _9  - 0 Specimen(s) / Culture(s) sent or taken to Lab for analysis _10  - 0 Patient Transfer (multiple staff / Civil Service fast streamer / Similar devices) _11  - 0 Simple Staple / Suture removal (25 or less) _12  - 0 Complex Staple / Suture removal (26 or more) _13  - 0 Hypo / Hyperglycemic Management (close monitor of Blood Glucose) _14  - 0 Ankle / Brachial Index (ABI) - do not check if billed separately X- 1 5 Vital Signs Has the patient been seen at the hospital within the last three years: Yes Total Score: 75 Level Of Care: New/Established - Level 2 Electronic Signature(s) Signed: 11/05/2021 10:45:13 AM By: Carlene Coria RN Entered By: Carlene Coria on 11/02/2021 10:29:35 Qadri, Venkat T. ([id number]) -------------------------------------------------------------------------------- Encounter Discharge Information Details Patient Name: [name], Tanish T. Date of Service: 11/02/2021 10:15 AM Medical Record  Number: [id number] Patient Account Number: [id number] Date of Birth/Sex: 02-24-1941 (81 y.o. M) Treating RN: Carlene Coria Primary Care Cruzita Lipa: Owens Loffler Other Clinician: Referring Merial Moritz: Owens Loffler Treating Jerusalen Mateja/Extender: Skipper Cliche in Treatment: 4 Encounter Discharge Information Items Discharge Condition: Stable Ambulatory Status: Ambulatory Discharge Destination: Home Transportation: Private Auto Accompanied By: wife Schedule Follow-up Appointment: Yes Clinical Summary of Care: Electronic Signature(s) Signed: 11/05/2021 10:45:13 AM By: Carlene Coria RN Entered By: Carlene Coria on 11/02/2021 10:31:18 Brouillard, Justinian T. ([id number]) -------------------------------------------------------------------------------- Lower Extremity Assessment Details Patient Name: Labate, Kelsey T. Date of Service: 11/02/2021 10:15 AM Medical Record Number: [id number] Patient Account Number: [id number] Date of Birth/Sex: Aug 11, 1940 (81 y.o. M) Treating RN: Carlene Coria Primary Care Creston Klas: Owens Loffler Other Clinician: Referring Martell Mcfadyen: Owens Loffler Treating Zhaniya Swallows/Extender: Skipper Cliche in Treatment: 4 Electronic Signature(s) Signed: 11/05/2021 10:45:13 AM By: Carlene Coria RN Entered By: Carlene Coria on 11/02/2021 10:23:30 Vara, Maison T. ([id number]) -------------------------------------------------------------------------------- Multi Wound Chart Details Patient Name: Kistler, Modesto T. Date of Service: 11/02/2021 10:15 AM Medical Record Number: [id number] Patient Account Number: [id number] Date of Birth/Sex: 1941-03-02 (81 y.o. M) Treating RN: Carlene Coria Primary Care Nathan Stallworth: Owens Loffler Other Clinician: Referring Briany Aye: Owens Loffler Treating Kandie Keiper/Extender: Skipper Cliche in Treatment: 4 Vital Signs Height(in): 69 Pulse(bpm): 9 Weight(lbs): 174 Blood Pressure(mmHg): 136/112 Body Mass Index(BMI): 25.7 Temperature(F):  97.8 Respiratory Rate(breaths/min): 16 Photos: [N/A:N/A] Wound Location: Left, Medial Ankle N/A N/A Wounding Event: Gradually Appeared N/A N/A Primary Etiology: Venous Leg Ulcer N/A N/A Comorbid History: Coronary Artery Disease, Deep Vein N/A N/A Thrombosis, Hypertension, Peripheral Arterial Disease Date Acquired: 08/23/2021 N/A N/A Weeks of Treatment: 4 N/A N/A Wound Status: Open N/A N/A Wound Recurrence: No N/A N/A Measurements L x W x D (cm) 0.2x0.2x0.1 N/A N/A Area (cm) : 0.031 N/A N/A Volume (cm) : 0.003 N/A N/A % Reduction in  Area: 84.20% N/A N/A % Reduction in Volume: 85.00% N/A N/A Classification: Full Thickness Without Exposed N/A N/A Support Structures Exudate Amount: Medium N/A N/A Exudate Type: Serosanguineous N/A N/A Exudate Color: red, brown N/A N/A Granulation Amount: Medium (34-66%) N/A N/A Granulation Quality: Red N/A N/A Necrotic Amount: Medium (34-66%) N/A N/A Exposed Structures: Fat Layer (Subcutaneous Tissue): N/A N/A Yes Fascia: No Tendon: No Muscle: No Joint: No Bone: No Epithelialization: None N/A N/A Treatment Notes Electronic Signature(s) Signed: 11/05/2021 10:45:13 AM By: Carlene Coria RN Entered By: Carlene Coria on 11/02/2021 10:23:50 Barco, Reinhardt T. ([id number]) -------------------------------------------------------------------------------- Multi-Disciplinary Care Plan Details Patient Name: [name], Alrick T. Date of Service: 11/02/2021 10:15 AM Medical Record Number: [id number] Patient Account Number: [id number] Date of Birth/Sex: 05-27-1940 (81 y.o. M) Treating RN: Carlene Coria Primary Care Burwell Bethel: Owens Loffler Other Clinician: Referring Rakin Lemelle: Owens Loffler Treating Leola Fiore/Extender: Skipper Cliche in Treatment: 4 Active Inactive Wound/Skin Impairment Nursing Diagnoses: Knowledge deficit related to ulceration/compromised skin integrity Goals: Patient/caregiver will verbalize understanding of skin care regimen Date  Initiated: 10/05/2021 Target Resolution Date: 11/04/2021 Goal Status: Active Ulcer/skin breakdown will have a volume reduction of 30% by week 4 Date Initiated: 10/05/2021 Target Resolution Date: 11/04/2021 Goal Status: Active Ulcer/skin breakdown will have a volume reduction of 50% by week 8 Date Initiated: 10/05/2021 Target Resolution Date: 12/05/2021 Goal Status: Active Ulcer/skin breakdown will have a volume reduction of 80% by week 12 Date Initiated: 10/05/2021 Target Resolution Date: 01/05/2022 Goal Status: Active Ulcer/skin breakdown will heal within 14 weeks Date Initiated: 10/05/2021 Target Resolution Date: 02/04/2022 Goal Status: Active Interventions: Assess patient/caregiver ability to obtain necessary supplies Assess patient/caregiver ability to perform ulcer/skin care regimen upon admission and as needed Assess ulceration(s) every visit Notes: Electronic Signature(s) Signed: 11/05/2021 10:45:13 AM By: Carlene Coria RN Entered By: Carlene Coria on 11/02/2021 10:23:39 Floresca, Kaidon T. ([id number]) -------------------------------------------------------------------------------- Pain Assessment Details Patient Name: Asberry, Thi T. Date of Service: 11/02/2021 10:15 AM Medical Record Number: [id number] Patient Account Number: [id number] Date of Birth/Sex: 1941/03/22 (81 y.o. M) Treating RN: Carlene Coria Primary Care Tiari Andringa: Owens Loffler Other Clinician: Referring Tiyana Galla: Owens Loffler Treating Liban Guedes/Extender: Skipper Cliche in Treatment: 4 Active Problems Location of Pain Severity and Description of Pain Patient Has Paino No Site Locations Pain Management and Medication Current Pain Management: Electronic Signature(s) Signed: 11/05/2021 10:45:13 AM By: Carlene Coria RN Entered By: Carlene Coria on 11/02/2021 10:18:33 Degroat, Izael T. ([id number]) -------------------------------------------------------------------------------- Patient/Caregiver Education  Details Patient Name: [name], Gunnison T. Date of Service: 11/02/2021 10:15 AM Medical Record Number: [id number] Patient Account Number: [id number] Date of Birth/Gender: [birth year] (81 y.o. M) Treating RN: Carlene Coria Primary Care Physician: Owens Loffler Other Clinician: Referring Physician: Owens Loffler Treating Physician/Extender: Skipper Cliche in Treatment: 4 Education Assessment Education Provided To: Patient Education Topics Provided Wound/Skin Impairment: Methods: Explain/Verbal Responses: State content correctly Electronic Signature(s) Signed: 11/05/2021 10:45:13 AM By: Carlene Coria RN Entered By: Carlene Coria on 11/02/2021 10:30:42 Bynum, Ehab T. ([id number]) -------------------------------------------------------------------------------- Wound Assessment Details Patient Name: Sartin, Lautaro T. Date of Service: 11/02/2021 10:15 AM Medical Record Number: [id number] Patient Account Number: [id number] Date of Birth/Sex: 02/14/1941 (81 y.o. M) Treating RN: Carlene Coria Primary Care Nahiara Kretzschmar: Owens Loffler Other Clinician: Referring Rocquel Askren: Owens Loffler Treating Abid Bolla/Extender: Skipper Cliche in Treatment: 4 Wound Status Wound Number: 1 Primary Venous Leg Ulcer Etiology: Wound Location: Left, Medial Ankle Wound Open Wounding Event: Gradually Appeared Status: Date Acquired: 08/23/2021 Comorbid Coronary Artery Disease, Deep Vein Thrombosis, Weeks Of Treatment: 4 History: Hypertension, Peripheral Arterial Disease Clustered  Wound: No Photos Wound Measurements Length: (cm) 0.2 Width: (cm) 0.2 Depth: (cm) 0.1 Area: (cm) 0.031 Volume: (cm) 0.003 % Reduction in Area: 84.2% % Reduction in Volume: 85% Epithelialization: None Tunneling: No Undermining: No Wound Description Classification: Full Thickness Without Exposed Support Structures Exudate Amount: Medium Exudate Type: Serosanguineous Exudate Color: red, brown Foul Odor After Cleansing:  No Slough/Fibrino Yes Wound Bed Granulation Amount: Medium (34-66%) Exposed Structure Granulation Quality: Red Fascia Exposed: No Necrotic Amount: Medium (34-66%) Fat Layer (Subcutaneous Tissue) Exposed: Yes Tendon Exposed: No Muscle Exposed: No Joint Exposed: No Bone Exposed: No Treatment Notes Wound #1 (Ankle) Wound Laterality: Left, Medial Cleanser [place] Ancillary Kit - 15 Day Supply Discharge Instruction: Use supplies as instructed; Kit contains: (15) Saline Bullets; (15) 3x3 Gauze; 15 pr Gloves Peri-Wound Care Tamblyn, Lenell T. ([id number]) Topical Gentamicin Discharge Instruction: mix with TCA and apply to peri wound a nd wound bed Triamcinolone Acetonide Cream, 0.1%, 15 (g) tube Discharge Instruction: mix with gentamycin and apply to peri wound a nd wound bed Primary Dressing Hydrofera Blue Ready Transfer Foam, 2.5x2.5 (in/in) Discharge Instruction: Apply Hydrofera Blue Ready to wound bed as directed Secondary Dressing (SILICONE BORDER) Zetuvit Plus SILICONE BORDER Dressing 4x4 (in/in) Discharge Instruction: Please do not put silicone bordered dressings under wraps. Use non-bordered dressing only. Secured With Compression Wrap Compression Stockings Add-Ons tubi grip D Discharge Instruction: double Hydrologist) Signed: 11/05/2021 10:45:13 AM By: Carlene Coria RN Entered By: Carlene Coria on 11/02/2021 10:23:02 Walrond, Dandy T. ([id number]) -------------------------------------------------------------------------------- Vitals Details Patient Name: [name], Deloyd T. Date of Service: 11/02/2021 10:15 AM Medical Record Number: [id number] Patient Account Number: [id number] Date of Birth/Sex: 1940-10-12 (81 y.o. M) Treating RN: Carlene Coria Primary Care Elayjah Chaney: Owens Loffler Other Clinician: Referring Wilfred Siverson: Owens Loffler Treating Sim Choquette/Extender: Skipper Cliche in Treatment: 4 Vital Signs Time Taken: 10:17 Temperature (F): 97.8 Height  (in): 69 Pulse (bpm): 77 Weight (lbs): 174 Respiratory Rate (breaths/min): 16 Body Mass Index (BMI): 25.7 Blood Pressure (mmHg): 136/112 Reference Range: 80 - 120 mg / dl Electronic Signature(s) Signed: 11/05/2021 10:45:13 AM By: Carlene Coria RN Entered By: Carlene Coria on 11/02/2021 10:18:04

## 2021-11-09 ENCOUNTER — Emergency Department: Payer: Medicare Other

## 2021-11-09 ENCOUNTER — Encounter: Payer: Medicare Other | Admitting: Physician Assistant

## 2021-11-09 ENCOUNTER — Other Ambulatory Visit: Payer: Self-pay

## 2021-11-09 ENCOUNTER — Emergency Department
Admission: EM | Admit: 2021-11-09 | Discharge: 2021-11-09 | Disposition: A | Discharge status: Home / Self Care | Payer: Medicare Other | Attending: Emergency Medicine | Admitting: Emergency Medicine

## 2021-11-09 ENCOUNTER — Telehealth: Payer: Self-pay | Admitting: Cardiovascular Disease

## 2021-11-09 DIAGNOSIS — J441 Chronic obstructive pulmonary disease with (acute) exacerbation: Secondary | ICD-10-CM | POA: Insufficient documentation

## 2021-11-09 DIAGNOSIS — R791 Abnormal coagulation profile: Secondary | ICD-10-CM | POA: Diagnosis not present

## 2021-11-09 DIAGNOSIS — R059 Cough, unspecified: Secondary | ICD-10-CM | POA: Insufficient documentation

## 2021-11-09 DIAGNOSIS — Z20822 Contact with and (suspected) exposure to covid-19: Secondary | ICD-10-CM | POA: Insufficient documentation

## 2021-11-09 DIAGNOSIS — I4891 Unspecified atrial fibrillation: Secondary | ICD-10-CM | POA: Insufficient documentation

## 2021-11-09 DIAGNOSIS — Z7902 Long term (current) use of antithrombotics/antiplatelets: Secondary | ICD-10-CM | POA: Diagnosis not present

## 2021-11-09 DIAGNOSIS — R0602 Shortness of breath: Secondary | ICD-10-CM | POA: Diagnosis not present

## 2021-11-09 DIAGNOSIS — I110 Hypertensive heart disease with heart failure: Secondary | ICD-10-CM | POA: Diagnosis not present

## 2021-11-09 DIAGNOSIS — M7989 Other specified soft tissue disorders: Secondary | ICD-10-CM | POA: Diagnosis not present

## 2021-11-09 DIAGNOSIS — I70212 Atherosclerosis of native arteries of extremities with intermittent claudication, left leg: Secondary | ICD-10-CM | POA: Diagnosis not present

## 2021-11-09 DIAGNOSIS — R790 Abnormal level of blood mineral: Secondary | ICD-10-CM | POA: Diagnosis present

## 2021-11-09 DIAGNOSIS — L97322 Non-pressure chronic ulcer of left ankle with fat layer exposed: Secondary | ICD-10-CM | POA: Diagnosis not present

## 2021-11-09 DIAGNOSIS — I87332 Chronic venous hypertension (idiopathic) with ulcer and inflammation of left lower extremity: Secondary | ICD-10-CM | POA: Diagnosis not present

## 2021-11-09 DIAGNOSIS — L97328 Non-pressure chronic ulcer of left ankle with other specified severity: Secondary | ICD-10-CM | POA: Diagnosis not present

## 2021-11-09 DIAGNOSIS — I509 Heart failure, unspecified: Secondary | ICD-10-CM | POA: Diagnosis not present

## 2021-11-09 DIAGNOSIS — I2510 Atherosclerotic heart disease of native coronary artery without angina pectoris: Secondary | ICD-10-CM | POA: Insufficient documentation

## 2021-11-09 LAB — CBC
HCT: 46.7 % (ref 39.0–52.0)
Hemoglobin: 14.2 g/dL (ref 13.0–17.0)
MCH: 27.8 pg (ref 26.0–34.0)
MCHC: 30.4 g/dL (ref 30.0–36.0)
MCV: 91.4 fL (ref 80.0–100.0)
Platelets: 189 10*3/uL (ref 150–400)
RBC: 5.11 MIL/uL (ref 4.22–5.81)
RDW: 15.6 % — ABNORMAL HIGH (ref 11.5–15.5)
WBC: 7.7 10*3/uL (ref 4.0–10.5)
nRBC: 0 % (ref 0.0–0.2)

## 2021-11-09 LAB — BASIC METABOLIC PANEL
Anion gap: 5 (ref 5–15)
BUN: 29 mg/dL — ABNORMAL HIGH (ref 8–23)
CO2: 30 mmol/L (ref 22–32)
Calcium: 9.1 mg/dL (ref 8.9–10.3)
Chloride: 109 mmol/L (ref 98–111)
Creatinine, Ser: 1.57 mg/dL — ABNORMAL HIGH (ref 0.61–1.24)
GFR, Estimated: 44 mL/min — ABNORMAL LOW (ref >60)
Glucose, Bld: 102 mg/dL — ABNORMAL HIGH (ref 70–99)
Potassium: 4.4 mmol/L (ref 3.5–5.1)
Sodium: 144 mmol/L (ref 135–145)

## 2021-11-09 LAB — TROPONIN I (HIGH SENSITIVITY)
Troponin I (High Sensitivity): 55 ng/L — ABNORMAL HIGH (ref <18)
Troponin I (High Sensitivity): 47 ng/L — ABNORMAL HIGH (ref <18)

## 2021-11-09 LAB — PROTIME-INR
INR: 1.9 — ABNORMAL HIGH (ref 0.8–1.2)
Prothrombin Time: 21.2 seconds — ABNORMAL HIGH (ref 11.4–15.2)

## 2021-11-09 LAB — SARS CORONAVIRUS 2 BY RT PCR: SARS Coronavirus 2 by RT PCR: NEGATIVE

## 2021-11-09 LAB — BRAIN NATRIURETIC PEPTIDE: B Natriuretic Peptide: 271.8 pg/mL — ABNORMAL HIGH (ref 0.0–100.0)

## 2021-11-09 MED ORDER — PREDNISONE 20 MG PO TABS
60.0000 mg | ORAL_TABLET | Freq: Once | ORAL | Status: AC
  Administered 2021-11-09: 60 mg via ORAL
  Filled 2021-11-09: qty 3

## 2021-11-09 MED ORDER — IPRATROPIUM-ALBUTEROL 0.5-2.5 (3) MG/3ML IN SOLN
6.0000 mL | Freq: Once | RESPIRATORY_TRACT | Status: AC
  Administered 2021-11-09: 6 mL via RESPIRATORY_TRACT
  Filled 2021-11-09: qty 3

## 2021-11-09 MED ORDER — PREDNISONE 20 MG PO TABS: 60.0000 mg | ORAL_TABLET | Freq: Every day | ORAL | 0 refills | Status: AC

## 2021-11-09 MED ORDER — IPRATROPIUM-ALBUTEROL 0.5-2.5 (3) MG/3ML IN SOLN
3.0000 mL | Freq: Once | RESPIRATORY_TRACT | Status: AC
  Administered 2021-11-09: 3 mL via RESPIRATORY_TRACT
  Filled 2021-11-09: qty 3

## 2021-11-09 MED ORDER — IOHEXOL 350 MG/ML SOLN
50.0000 mL | Freq: Once | INTRAVENOUS | Status: AC | PRN
  Administered 2021-11-09: 50 mL via INTRAVENOUS

## 2021-11-09 NOTE — Telephone Encounter (Addendum)
Returned the call to "Gay Filler" at [facility].  No answer. Left a detailed message on their voicemail. If patient is having trouble breathing and believed to be in acute CHF he should be taken to the ED for evaluation.  If any questions, someone there can return the call.

## 2021-11-09 NOTE — ED Triage Notes (Signed)
Pt here with SOB and a cough that started 3-4 days ago. Pt has a hx of COPD and CHF. Pt breathing diminished while sitting. Pt denies pain, N,V.

## 2021-11-09 NOTE — ED Provider Notes (Signed)
[facility] Provider Note    Event Date/Time   First MD Initiated Contact with Patient 11/09/21 [phone]     (approximate)   History   Chief Complaint Shortness of Breath   HPI  [name] is a 81 y.o. male with past medical history of hypertension, CAD, COPD, atrial fibrillation on Coumadin, CHF, DVT, PE, and PAD who presents to the ED complaining of shortness of breath.  Patient reports that he has been dealing with increasing difficulty breathing over the past 4 days associated with nonproductive cough.  He denies any fevers or pain in his chest, does state that both of his legs have been more swollen than usual over this time.  He went to usual follow-up with a wound care clinic for a wound to his left ankle earlier today, was advised to be evaluated in the ED due to his difficulty breathing and leg swelling.  He states that he has been compliant with his usual Lasix regimen.     Physical Exam   Triage Vital Signs: ED Triage Vitals [11/09/21 0934]  Enc Vitals Group     BP 136/65     Pulse Rate 89     Resp (!) 22     Temp (!) 97.5 F (36.4 C)     Temp Source Oral     SpO2 97 %     Weight 174 lb (78.9 kg)     Height '5\' 9"'$  (1.753 m)     Head Circumference      Peak Flow      Pain Score 0     Pain Loc      Pain Edu?      Excl. in [place]?     Most recent vital signs: Vitals:   11/09/21 1030 11/09/21 1245  BP: (!) 126/55 (!) 118/58  Pulse: 87 86  Resp: 20 17  Temp:    SpO2: 99% 97%    Constitutional: Alert and oriented. Eyes: Conjunctivae are normal. Head: Atraumatic. Nose: No congestion/rhinnorhea. Mouth/Throat: Mucous membranes are moist.  Cardiovascular: Normal rate, regular rhythm. Grossly normal heart sounds.  2+ radial and DP pulses bilaterally. Respiratory: Tachypneic with mildly increased respiratory effort.  No retractions. Lungs with expiratory wheezing bilaterally. Gastrointestinal: Soft and nontender. No  distention. Musculoskeletal: 2+ pitting edema to right lower extremity with mild erythema but no tenderness.  1+ pitting edema to left lower extremity with no erythema, warmth, or tenderness.  Healing wound to left medial ankle noted. Neurologic:  Normal speech and language. No gross focal neurologic deficits are appreciated.    ED Results / Procedures / Treatments   Labs (all labs ordered are listed, but only abnormal results are displayed) Labs Reviewed  CBC - Abnormal; Notable for the following components:      Result Value   RDW 15.6 (*)    All other components within normal limits  BASIC METABOLIC PANEL - Abnormal; Notable for the following components:   Glucose, Bld 102 (*)    BUN 29 (*)    Creatinine, Ser 1.57 (*)    GFR, Estimated 44 (*)    All other components within normal limits  BRAIN NATRIURETIC PEPTIDE - Abnormal; Notable for the following components:   B Natriuretic Peptide 271.8 (*)    All other components within normal limits  PROTIME-INR - Abnormal; Notable for the following components:   Prothrombin Time 21.2 (*)    INR 1.9 (*)    All other components within normal limits  TROPONIN I (HIGH SENSITIVITY) - Abnormal; Notable for the following components:   Troponin I (High Sensitivity) 55 (*)    All other components within normal limits  TROPONIN I (HIGH SENSITIVITY) - Abnormal; Notable for the following components:   Troponin I (High Sensitivity) 47 (*)    All other components within normal limits  SARS CORONAVIRUS 2 BY RT PCR     EKG  ED ECG REPORT I, Blake Divine, the attending physician, personally viewed and interpreted this ECG.   Date: 11/09/2021  EKG Time: 9:41  Rate: 90  Rhythm: normal sinus rhythm, frequent PVC's noted  Axis: Normal  Intervals:right bundle branch block  ST&T Change: None  RADIOLOGY Chest x-ray reviewed and interpreted by me with no infiltrate, edema, or effusion.  PROCEDURES:  Critical Care performed:  No  Procedures   MEDICATIONS ORDERED IN ED: Medications  ipratropium-albuterol (DUONEB) 0.5-2.5 (3) MG/3ML nebulizer solution 3 mL (3 mLs Nebulization Given 11/09/21 1027)  iohexol (OMNIPAQUE) 350 MG/ML injection 50 mL (50 mLs Intravenous Contrast Given 11/09/21 1058)  ipratropium-albuterol (DUONEB) 0.5-2.5 (3) MG/3ML nebulizer solution 6 mL (6 mLs Nebulization Given 11/09/21 1203)  predniSONE (DELTASONE) tablet 60 mg (60 mg Oral Given 11/09/21 1203)     IMPRESSION / MDM / ASSESSMENT AND PLAN / ED COURSE  I reviewed the triage vital signs and the nursing notes.                              81 y.o. male with past medical history of hypertension, CAD, CHF, atrial fibrillation on Coumadin, COPD, DVT, and PAD who presents to the ED complaining of increasing difficulty breathing with cough and leg swelling over the past 3 to 4 days.  Patient's presentation is most consistent with acute presentation with potential threat to life or bodily function.  Differential diagnosis includes, but is not limited to, ACS, PE, pneumonia, CHF exacerbation, COPD exacerbation, DVT, venous stasis dermatitis.  Patient nontoxic-appearing and in no acute distress, is mildly tachypneic with slightly increased work of breathing, but maintaining oxygen saturations on room air.  He does have bilateral lower extremity edema, however this is more pronounced on the right with associated erythema, does not appear consistent with a cellulitis and may be related to venous stasis, however with his history we will further assess with ultrasound for DVT.  Lungs with some mild wheezing on exam but does not seem to fully account for his difficulty breathing.  He denies any chest pain and EKG shows no ischemic changes, troponin is pending.  Plan to further assess for CHF, but will need to rule out PE with CTA of his chest.  CTA is negative for PE, right lower extremity ultrasound is negative for DVT.  Suspect his swelling and erythema  is due to venous stasis dermatitis, does not appear consistent with a cellulitis.  Troponin mildly elevated but similar to prior baseline and stable on recheck, doubt ACS.  Remainder of labs are reassuring with renal function stable compared to previous, no significant anemia or leukocytosis noted.  BNP is mildly elevated but CTA with no evidence of pulmonary edema.  Patient continues to have wheezing on reexamination, does state he feels better following DuoNeb.  Suspect symptoms are primarily due to COPD exacerbation, he was given dose of prednisone and additional DuoNebs with significant improvement in symptoms.  He is now requesting to be discharged home, which is reasonable given his improvement.  He was counseled to  follow-up with his PCP and to return to the ED for new or worsening symptoms, patient agrees with plan.      FINAL CLINICAL IMPRESSION(S) / ED DIAGNOSES   Final diagnoses:  COPD exacerbation ([place])  Shortness of breath     Rx / DC Orders   ED Discharge Orders          Ordered    predniSONE (DELTASONE) 20 MG tablet  Daily with breakfast        11/09/21 1307             Note:  This document was prepared using Dragon voice recognition software and may include unintentional dictation errors.   Blake Divine, MD 11/09/21 1310

## 2021-11-09 NOTE — Telephone Encounter (Signed)
Gay Filler is returning call. She states they sent patient to the hospital--FYI.

## 2021-11-09 NOTE — Telephone Encounter (Signed)
Review of the patients chart. Pt currently in the [facility] ED.

## 2021-11-09 NOTE — Telephone Encounter (Signed)
Gay Filler from [place] [facility] called stating that  one of their PA there said that the pt has had trouble breathing and is in congestive heart failure. They are asking for a call back asap.

## 2021-11-09 NOTE — Progress Notes (Signed)
DOCTOR, SHEAHAN ([id number]) Visit Report for 11/09/2021 Arrival Information Details Patient Name: [name], [name] Date of Service: 11/09/2021 8:00 AM Medical Record Number: [id number] Patient Account Number: [id number] Date of Birth/Sex: 1941-03-31 (81 y.o. M) Treating RN: Carlene Coria Primary Care Charley Miske: Owens Loffler Other Clinician: Referring Aeris Hersman: Owens Loffler Treating Monzerat Handler/Extender: Skipper Cliche in Treatment: 5 Visit Information History Since Last Visit All ordered tests and consults were completed: No Patient Arrived: Ambulatory Added or deleted any medications: No Arrival Time: 08:10 Any new allergies or adverse reactions: No Accompanied By: self Had a fall or experienced change in No Transfer Assistance: None activities of daily living that may affect Patient Identification Verified: Yes risk of falls: Secondary Verification Process Completed: Yes Signs or symptoms of abuse/neglect since last visito No Patient Requires Transmission-Based No Hospitalized since last visit: No Precautions: Implantable device outside of the clinic excluding No Patient Has Alerts: Yes cellular tissue based products placed in the center Patient Alerts: Patient on Blood since last visit: Thinner Has Dressing in Place as Prescribed: Yes L ABI .56 04/22/21 Pain Present Now: No R ABI .61 04/22/21 Electronic Signature(s) Signed: 11/09/2021 8:33:55 AM By: Carlene Coria RN Entered By: Carlene Coria on 11/09/2021 08:14:21 Genson, Khali T. ([id number]) -------------------------------------------------------------------------------- Lower Extremity Assessment Details Patient Name: [name], [name] Date of Service: 11/09/2021 8:00 AM Medical Record Number: [id number] Patient Account Number: [id number] Date of Birth/Sex: 1940/08/20 (81 y.o. M) Treating RN: Carlene Coria Primary Care Aunesty Tyson: Owens Loffler Other Clinician: Referring Mahum Betten: Owens Loffler Treating  Kinnie Kaupp/Extender: Skipper Cliche in Treatment: 5 Electronic Signature(s) Signed: 11/09/2021 8:33:55 AM By: Carlene Coria RN Entered By: Carlene Coria on 11/09/2021 08:22:44 Weingart, Ernestine T. ([id number]) -------------------------------------------------------------------------------- Multi Wound Chart Details Patient Name: [name], [name] Date of Service: 11/09/2021 8:00 AM Medical Record Number: [id number] Patient Account Number: [id number] Date of Birth/Sex: 1941/03/24 (81 y.o. M) Treating RN: Carlene Coria Primary Care Deaun Rocha: Owens Loffler Other Clinician: Referring Garhett Bernhard: Owens Loffler Treating Girolamo Lortie/Extender: Skipper Cliche in Treatment: 5 Vital Signs Height(in): 69 Pulse(bpm): 86 Weight(lbs): 174 Blood Pressure(mmHg): 136/56 Body Mass Index(BMI): 25.7 Temperature(F): 98.2 Respiratory Rate(breaths/min): 18 Photos: [N/A:N/A] Wound Location: Left, Medial Ankle N/A N/A Wounding Event: Gradually Appeared N/A N/A Primary Etiology: Venous Leg Ulcer N/A N/A Comorbid History: Coronary Artery Disease, Deep Vein N/A N/A Thrombosis, Hypertension, Peripheral Arterial Disease Date Acquired: 08/23/2021 N/A N/A Weeks of Treatment: 5 N/A N/A Wound Status: Open N/A N/A Wound Recurrence: No N/A N/A Measurements L x W x D (cm) 0.3x0.2x0.1 N/A N/A Area (cm) : 0.047 N/A N/A Volume (cm) : 0.005 N/A N/A % Reduction in Area: 76.00% N/A N/A % Reduction in Volume: 75.00% N/A N/A Classification: Full Thickness Without Exposed N/A N/A Support Structures Exudate Amount: Medium N/A N/A Exudate Type: Serosanguineous N/A N/A Exudate Color: red, brown N/A N/A Granulation Amount: Medium (34-66%) N/A N/A Granulation Quality: Red N/A N/A Necrotic Amount: Medium (34-66%) N/A N/A Exposed Structures: Fat Layer (Subcutaneous Tissue): N/A N/A Yes Fascia: No Tendon: No Muscle: No Joint: No Bone: No Epithelialization: None N/A N/A Treatment Notes Electronic Signature(s) Signed:  11/09/2021 8:33:55 AM By: Carlene Coria RN Entered By: Carlene Coria on 11/09/2021 08:23:43 Bittick, Remiel T. ([id number]) -------------------------------------------------------------------------------- Multi-Disciplinary Care Plan Details Patient Name: [name], [name] Date of Service: 11/09/2021 8:00 AM Medical Record Number: [id number] Patient Account Number: [id number] Date of Birth/Sex: 1940-09-25 (81 y.o. M) Treating RN: Carlene Coria Primary Care Kavir Savoca: Owens Loffler Other Clinician: Referring Shriley Joffe: Owens Loffler Treating Liandra Mendia/Extender: Skipper Cliche in Treatment: 5  Active Inactive Wound/Skin Impairment Nursing Diagnoses: Knowledge deficit related to ulceration/compromised skin integrity Goals: Patient/caregiver will verbalize understanding of skin care regimen Date Initiated: 10/05/2021 Target Resolution Date: 11/04/2021 Goal Status: Active Ulcer/skin breakdown will have a volume reduction of 30% by week 4 Date Initiated: 10/05/2021 Target Resolution Date: 11/04/2021 Goal Status: Active Ulcer/skin breakdown will have a volume reduction of 50% by week 8 Date Initiated: 10/05/2021 Target Resolution Date: 12/05/2021 Goal Status: Active Ulcer/skin breakdown will have a volume reduction of 80% by week 12 Date Initiated: 10/05/2021 Target Resolution Date: 01/05/2022 Goal Status: Active Ulcer/skin breakdown will heal within 14 weeks Date Initiated: 10/05/2021 Target Resolution Date: 02/04/2022 Goal Status: Active Interventions: Assess patient/caregiver ability to obtain necessary supplies Assess patient/caregiver ability to perform ulcer/skin care regimen upon admission and as needed Assess ulceration(s) every visit Notes: Electronic Signature(s) Signed: 11/09/2021 8:33:55 AM By: Carlene Coria RN Entered By: Carlene Coria on 11/09/2021 08:23:24 Frei, Audrey T. ([id number]) -------------------------------------------------------------------------------- Pain  Assessment Details Patient Name: [name], [name] Date of Service: 11/09/2021 8:00 AM Medical Record Number: [id number] Patient Account Number: [id number] Date of Birth/Sex: 01-Mar-1941 (81 y.o. M) Treating RN: Carlene Coria Primary Care Aniza Shor: Owens Loffler Other Clinician: Referring Sruthi Maurer: Owens Loffler Treating Bich Mchaney/Extender: Skipper Cliche in Treatment: 5 Active Problems Location of Pain Severity and Description of Pain Patient Has Paino No Site Locations Pain Management and Medication Current Pain Management: Electronic Signature(s) Signed: 11/09/2021 8:33:55 AM By: Carlene Coria RN Entered By: Carlene Coria on 11/09/2021 08:16:46 Pruiett, Rayfield T. ([id number]) -------------------------------------------------------------------------------- Wound Assessment Details Patient Name: [name], [name] Date of Service: 11/09/2021 8:00 AM Medical Record Number: [id number] Patient Account Number: [id number] Date of Birth/Sex: [birth year] (81 y.o. M) Treating RN: Carlene Coria Primary Care Shellyann Wandrey: Owens Loffler Other Clinician: Referring Antania Hoefling: Owens Loffler Treating Atara Paterson/Extender: Skipper Cliche in Treatment: 5 Wound Status Wound Number: 1 Primary Venous Leg Ulcer Etiology: Wound Location: Left, Medial Ankle Wound Open Wounding Event: Gradually Appeared Status: Date Acquired: 08/23/2021 Comorbid Coronary Artery Disease, Deep Vein Thrombosis, Weeks Of Treatment: 5 History: Hypertension, Peripheral Arterial Disease Clustered Wound: No Photos Wound Measurements Length: (cm) 0.3 Width: (cm) 0.2 Depth: (cm) 0.1 Area: (cm) 0.047 Volume: (cm) 0.005 % Reduction in Area: 76% % Reduction in Volume: 75% Epithelialization: None Tunneling: No Undermining: No Wound Description Classification: Full Thickness Without Exposed Support Structu Exudate Amount: Medium Exudate Type: Serosanguineous Exudate Color: red, brown res Foul Odor After Cleansing:  No Slough/Fibrino Yes Wound Bed Granulation Amount: Medium (34-66%) Exposed Structure Granulation Quality: Red Fascia Exposed: No Necrotic Amount: Medium (34-66%) Fat Layer (Subcutaneous Tissue) Exposed: Yes Tendon Exposed: No Muscle Exposed: No Joint Exposed: No Bone Exposed: No Electronic Signature(s) Signed: 11/09/2021 8:33:55 AM By: Carlene Coria RN Entered By: Carlene Coria on 11/09/2021 08:22:28 Amyx, Kayshawn T. ([id number]) -------------------------------------------------------------------------------- Vitals Details Patient Name: [name], [name] Date of Service: 11/09/2021 8:00 AM Medical Record Number: [id number] Patient Account Number: [id number] Date of Birth/Sex: [birth year] (81 y.o. M) Treating RN: Carlene Coria Primary Care Edy Belt: Owens Loffler Other Clinician: Referring Loudon Krakow: Owens Loffler Treating Markesia Crilly/Extender: Skipper Cliche in Treatment: 5 Vital Signs Time Taken: 08:14 Temperature (F): 98.2 Height (in): 69 Pulse (bpm): 86 Weight (lbs): 174 Respiratory Rate (breaths/min): 18 Body Mass Index (BMI): 25.7 Blood Pressure (mmHg): 136/56 Reference Range: 80 - 120 mg / dl Electronic Signature(s) Signed: 11/09/2021 8:33:55 AM By: Carlene Coria RN Entered By: Carlene Coria on 11/09/2021 08:15:25

## 2021-11-09 NOTE — Progress Notes (Signed)
YURIEL, LOPEZMARTINEZ ([id number]) Visit Report for 11/09/2021 Chief Complaint Document Details Patient Name: [name], [name] Date of Service: 11/09/2021 8:00 AM Medical Record Number: [id number] Patient Account Number: [id number] Date of Birth/Sex: 07/31/1940 (81 y.o. M) Treating RN: Carlene Coria Primary Care Provider: Owens Loffler Other Clinician: Referring Provider: Owens Loffler Treating Provider/Extender: Skipper Cliche in Treatment: 5 Information Obtained from: Patient Chief Complaint 10/05/2021; patient is here for review of a wound on the left medial ankle Electronic Signature(s) Signed: 11/09/2021 8:26:45 AM By: Worthy Keeler PA-C Entered By: Worthy Keeler on 11/09/2021 08:26:45 [name], [name] ([id number]) -------------------------------------------------------------------------------- Problem List Details Patient Name: [name], [name] Date of Service: 11/09/2021 8:00 AM Medical Record Number: [id number] Patient Account Number: [id number] Date of Birth/Sex: 1940-11-26 (81 y.o. M) Treating RN: Carlene Coria Primary Care Provider: Owens Loffler Other Clinician: Referring Provider: Owens Loffler Treating Provider/Extender: Skipper Cliche in Treatment: 5 Active Problems ICD-10 Encounter Code Description Active Date MDM Diagnosis I87.332 Chronic venous hypertension (idiopathic) with ulcer and inflammation of 10/05/2021 No Yes left lower extremity L97.328 Non-pressure chronic ulcer of left ankle with other specified severity 10/05/2021 No Yes I70.212 Atherosclerosis of native arteries of extremities with intermittent 10/05/2021 No Yes claudication, left leg Inactive Problems Resolved Problems Electronic Signature(s) Signed: 11/09/2021 8:26:42 AM By: Worthy Keeler PA-C Entered By: Worthy Keeler on 11/09/2021 96:22:29

## 2021-11-11 ENCOUNTER — Ambulatory Visit (INDEPENDENT_AMBULATORY_CARE_PROVIDER_SITE_OTHER): Payer: Medicare Other

## 2021-11-11 DIAGNOSIS — Z7901 Long term (current) use of anticoagulants: Secondary | ICD-10-CM | POA: Diagnosis not present

## 2021-11-11 LAB — POCT INR: INR: 1.8 — AB (ref 2.0–3.0)

## 2021-11-11 NOTE — Progress Notes (Addendum)
Pt in ER on 7/18 for COPD exacerbation. Pt has been started on doxycycline, 100 mg, x 4 weeks for dermis staph infection. Pt in today to monitor for interactions. Pt started doxycycline on 6/27.  Pt has been on the low side of his INR range or subtherapeutic. Pt tested in ER and had 1.9 INR.   Increase dose today to take 1 tablet and then change weekly dose to take 1/2 tablet daily except take 1 tablet on Sundays and Wednesdays.  Recheck in 1 week.

## 2021-11-11 NOTE — Patient Instructions (Addendum)
Pre visit review using our clinic review tool, if applicable. No additional management support is needed unless otherwise documented below in the visit note.  Increase dose today to take 1 tablet and then change weekly dose to take 1/2 tablet daily except take 1 tablet on Sundays and Wednesdays.  Recheck in 1 week.

## 2021-11-16 ENCOUNTER — Encounter: Payer: Medicare Other | Admitting: Internal Medicine

## 2021-11-16 DIAGNOSIS — I70212 Atherosclerosis of native arteries of extremities with intermittent claudication, left leg: Secondary | ICD-10-CM | POA: Diagnosis not present

## 2021-11-16 DIAGNOSIS — L97328 Non-pressure chronic ulcer of left ankle with other specified severity: Secondary | ICD-10-CM | POA: Diagnosis not present

## 2021-11-16 DIAGNOSIS — I87332 Chronic venous hypertension (idiopathic) with ulcer and inflammation of left lower extremity: Secondary | ICD-10-CM | POA: Diagnosis not present

## 2021-11-16 DIAGNOSIS — L97322 Non-pressure chronic ulcer of left ankle with fat layer exposed: Secondary | ICD-10-CM | POA: Diagnosis not present

## 2021-11-16 NOTE — Progress Notes (Signed)
CARZELL, SALDIVAR ([id number]) Visit Report for 11/16/2021 Arrival Information Details Patient Name: DEFINO, Dayle T. Date of Service: 11/16/2021 8:00 AM Medical Record Number: [id number] Patient Account Number: [id number] Date of Birth/Sex: 10-31-1940 (81 y.o. M) Treating RN: Carlene Coria Primary Care Ziyonna Christner: Owens Loffler Other Clinician: Referring Kobe Jansma: Owens Loffler Treating Tahmir Kleckner/Extender: Tito Dine in Treatment: 6 Visit Information History Since Last Visit All ordered tests and consults were completed: No Patient Arrived: Ambulatory Added or deleted any medications: No Arrival Time: 08:03 Any new allergies or adverse reactions: No Accompanied By: wife Had a fall or experienced change in No Transfer Assistance: None activities of daily living that may affect Patient Identification Verified: Yes risk of falls: Secondary Verification Process Completed: Yes Signs or symptoms of abuse/neglect since last visito No Patient Requires Transmission-Based No Hospitalized since last visit: No Precautions: Implantable device outside of the clinic excluding No Patient Has Alerts: Yes cellular tissue based products placed in the center Patient Alerts: Patient on Blood since last visit: Thinner Has Dressing in Place as Prescribed: Yes L ABI .56 04/22/21 Pain Present Now: Yes R ABI .61 04/22/21 Electronic Signature(s) Signed: 11/16/2021 3:54:37 PM By: Carlene Coria RN Entered By: Carlene Coria on 11/16/2021 08:08:37 Fregia, Yecheskel T. ([id number]) -------------------------------------------------------------------------------- Clinic Level of Care Assessment Details Patient Name: [name], Legacy T. Date of Service: 11/16/2021 8:00 AM Medical Record Number: [id number] Patient Account Number: [id number] Date of Birth/Sex: 12-03-1940 (81 y.o. M) Treating RN: Carlene Coria Primary Care Khanh Cordner: Owens Loffler Other Clinician: Referring Corleen Otwell: Owens Loffler Treating Norleen Xie/Extender: Tito Dine in Treatment: 6 Clinic Level of Care Assessment Items TOOL 4 Quantity Score X - Use when only an EandM is performed on FOLLOW-UP visit 1 0 ASSESSMENTS - Nursing Assessment / Reassessment X - Reassessment of Co-morbidities (includes updates in patient status) 1 10 X- 1 5 Reassessment of Adherence to Treatment Plan ASSESSMENTS - Wound and Skin Assessment / Reassessment X - Simple Wound Assessment / Reassessment - one wound 1 5 []  - 0 Complex Wound Assessment / Reassessment - multiple wounds []  - 0 Dermatologic / Skin Assessment (not related to wound area) ASSESSMENTS - Focused Assessment []  - Circumferential Edema Measurements - multi extremities 0 []  - 0 Nutritional Assessment / Counseling / Intervention []  - 0 Lower Extremity Assessment (monofilament, tuning fork, pulses) []  - 0 Peripheral Arterial Disease Assessment (using hand held doppler) ASSESSMENTS - Ostomy and/or Continence Assessment and Care []  - Incontinence Assessment and Management 0 []  - 0 Ostomy Care Assessment and Management (repouching, etc.) PROCESS - Coordination of Care X - Simple Patient / Family Education for ongoing care 1 15 []  - 0 Complex (extensive) Patient / Family Education for ongoing care X- 1 10 Staff obtains Programmer, systems, Records, Test Results / Process Orders []  - 0 Staff telephones HHA, Nursing Homes / Clarify orders / etc []  - 0 Routine Transfer to another Facility (non-emergent condition) []  - 0 Routine Hospital Admission (non-emergent condition) []  - 0 New Admissions / Biomedical engineer / Ordering NPWT, Apligraf, etc. []  - 0 Emergency Hospital Admission (emergent condition) X- 1 10 Simple Discharge Coordination []  - 0 Complex (extensive) Discharge Coordination PROCESS - Special Needs []  - Pediatric / Minor Patient Management 0 []  - 0 Isolation Patient Management []  - 0 Hearing / Language / Visual special needs []  -  0 Assessment of Community assistance (transportation, D/C planning, etc.) []  - 0 Additional assistance / Altered mentation []  - 0 Support Surface(s) Assessment (bed, cushion, seat, etc.) INTERVENTIONS -  Wound Cleansing / Measurement Tayloe, Macauley T. ([id number]) X- 1 5 Simple Wound Cleansing - one wound []  - 0 Complex Wound Cleansing - multiple wounds X- 1 5 Wound Imaging (photographs - any number of wounds) []  - 0 Wound Tracing (instead of photographs) X- 1 5 Simple Wound Measurement - one wound []  - 0 Complex Wound Measurement - multiple wounds INTERVENTIONS - Wound Dressings X - Small Wound Dressing one or multiple wounds 1 10 []  - 0 Medium Wound Dressing one or multiple wounds []  - 0 Large Wound Dressing one or multiple wounds []  - 0 Application of Medications - topical []  - 0 Application of Medications - injection INTERVENTIONS - Miscellaneous []  - External ear exam 0 []  - 0 Specimen Collection (cultures, biopsies, blood, body fluids, etc.) []  - 0 Specimen(s) / Culture(s) sent or taken to Lab for analysis []  - 0 Patient Transfer (multiple staff / Civil Service fast streamer / Similar devices) []  - 0 Simple Staple / Suture removal (25 or less) []  - 0 Complex Staple / Suture removal (26 or more) []  - 0 Hypo / Hyperglycemic Management (close monitor of Blood Glucose) []  - 0 Ankle / Brachial Index (ABI) - do not check if billed separately X- 1 5 Vital Signs Has the patient been seen at the hospital within the last three years: Yes Total Score: 85 Level Of Care: New/Established - Level 3 Electronic Signature(s) Signed: 11/16/2021 3:54:37 PM By: Carlene Coria RN Entered By: Carlene Coria on 11/16/2021 08:19:15 Maimone, Rowan T. ([id number]) -------------------------------------------------------------------------------- Encounter Discharge Information Details Patient Name: [name], Issacc T. Date of Service: 11/16/2021 8:00 AM Medical Record Number: [id number] Patient Account Number:  [id number] Date of Birth/Sex: 1940-11-25 (81 y.o. M) Treating RN: Carlene Coria Primary Care Lerlene Treadwell: Owens Loffler Other Clinician: Referring Arianny Pun: Owens Loffler Treating Lorma Heater/Extender: Tito Dine in Treatment: 6 Encounter Discharge Information Items Discharge Condition: Stable Ambulatory Status: Ambulatory Discharge Destination: Home Transportation: Private Auto Accompanied By: wife Schedule Follow-up Appointment: Yes Clinical Summary of Care: Electronic Signature(s) Signed: 11/16/2021 3:54:37 PM By: Carlene Coria RN Entered By: Carlene Coria on 11/16/2021 08:20:09 Bilger, Dagoberto T. ([id number]) -------------------------------------------------------------------------------- Lower Extremity Assessment Details Patient Name: Rochette, Slyvester T. Date of Service: 11/16/2021 8:00 AM Medical Record Number: [id number] Patient Account Number: [id number] Date of Birth/Sex: 1940-09-19 (81 y.o. M) Treating RN: Carlene Coria Primary Care Tyshea Imel: Owens Loffler Other Clinician: Referring Jayma Volpi: Owens Loffler Treating Levester Waldridge/Extender: Tito Dine in Treatment: 6 Vascular Assessment Pulses: Dorsalis Pedis Palpable: [Left:Yes] Electronic Signature(s) Signed: 11/16/2021 3:54:37 PM By: Carlene Coria RN Entered By: Carlene Coria on 11/16/2021 08:13:21 Harman, Jamaine T. ([id number]) -------------------------------------------------------------------------------- Multi Wound Chart Details Patient Name: Musolino, Damario T. Date of Service: 11/16/2021 8:00 AM Medical Record Number: [id number] Patient Account Number: [id number] Date of Birth/Sex: 1940-12-25 (81 y.o. M) Treating RN: Carlene Coria Primary Care Alixandria Friedt: Owens Loffler Other Clinician: Referring Leiann Sporer: Owens Loffler Treating Everline Mahaffy/Extender: Tito Dine in Treatment: 6 Vital Signs Height(in): 69 Pulse(bpm): 63 Weight(lbs): 174 Blood Pressure(mmHg): 132/64 Body Mass Index(BMI):  25.7 Temperature(F): 97.8 Respiratory Rate(breaths/min): 18 Photos: [N/A:N/A] Wound Location: Left, Medial Ankle N/A N/A Wounding Event: Gradually Appeared N/A N/A Primary Etiology: Venous Leg Ulcer N/A N/A Comorbid History: Coronary Artery Disease, Deep Vein N/A N/A Thrombosis, Hypertension, Peripheral Arterial Disease Date Acquired: 08/23/2021 N/A N/A Weeks of Treatment: 6 N/A N/A Wound Status: Open N/A N/A Wound Recurrence: No N/A N/A Measurements L x W x D (cm) 0.2x0.1x0.1 N/A N/A Area (cm) : 0.016 N/A N/A Volume (cm) : 0.002 N/A  N/A % Reduction in Area: 91.80% N/A N/A % Reduction in Volume: 90.00% N/A N/A Classification: Full Thickness Without Exposed N/A N/A Support Structures Exudate Amount: None Present N/A N/A Granulation Amount: None Present (0%) N/A N/A Necrotic Amount: Large (67-100%) N/A N/A Exposed Structures: Fat Layer (Subcutaneous Tissue): N/A N/A Yes Fascia: No Tendon: No Muscle: No Joint: No Bone: No Epithelialization: None N/A N/A Treatment Notes Electronic Signature(s) Signed: 11/16/2021 3:54:37 PM By: Carlene Coria RN Entered By: Carlene Coria on 11/16/2021 08:13:31 Oldfield, Collan T. ([id number]) -------------------------------------------------------------------------------- Multi-Disciplinary Care Plan Details Patient Name: [name], Tony T. Date of Service: 11/16/2021 8:00 AM Medical Record Number: [id number] Patient Account Number: [id number] Date of Birth/Sex: July 23, 1940 (81 y.o. M) Treating RN: Carlene Coria Primary Care Troy Kanouse: Owens Loffler Other Clinician: Referring Jetty Berland: Owens Loffler Treating Averly Ericson/Extender: Tito Dine in Treatment: 6 Active Inactive Wound/Skin Impairment Nursing Diagnoses: Knowledge deficit related to ulceration/compromised skin integrity Goals: Patient/caregiver will verbalize understanding of skin care regimen Date Initiated: 10/05/2021 Target Resolution Date: 11/04/2021 Goal Status:  Active Ulcer/skin breakdown will have a volume reduction of 30% by week 4 Date Initiated: 10/05/2021 Target Resolution Date: 11/04/2021 Goal Status: Active Ulcer/skin breakdown will have a volume reduction of 50% by week 8 Date Initiated: 10/05/2021 Target Resolution Date: 12/05/2021 Goal Status: Active Ulcer/skin breakdown will have a volume reduction of 80% by week 12 Date Initiated: 10/05/2021 Target Resolution Date: 01/05/2022 Goal Status: Active Ulcer/skin breakdown will heal within 14 weeks Date Initiated: 10/05/2021 Target Resolution Date: 02/04/2022 Goal Status: Active Interventions: Assess patient/caregiver ability to obtain necessary supplies Assess patient/caregiver ability to perform ulcer/skin care regimen upon admission and as needed Assess ulceration(s) every visit Notes: Electronic Signature(s) Signed: 11/16/2021 3:54:37 PM By: Carlene Coria RN Entered By: Carlene Coria on 11/16/2021 08:13:24 Devins, Bergen T. ([id number]) -------------------------------------------------------------------------------- Pain Assessment Details Patient Name: Dubie, Bonner T. Date of Service: 11/16/2021 8:00 AM Medical Record Number: [id number] Patient Account Number: [id number] Date of Birth/Sex: 1940-08-13 (81 y.o. M) Treating RN: Carlene Coria Primary Care Rechelle Niebla: Owens Loffler Other Clinician: Referring Vicie Cech: Owens Loffler Treating Naithan Delage/Extender: Tito Dine in Treatment: 6 Active Problems Location of Pain Severity and Description of Pain Patient Has Paino Yes Site Locations With Dressing Change: Yes Duration of the Pain. Constant / Intermittento Intermittent Rate the pain. Current Pain Level: 0 Worst Pain Level: 5 Least Pain Level: 0 Tolerable Pain Level: 5 Character of Pain Describe the Pain: Burning Pain Management and Medication Current Pain Management: Medication: Yes Cold Application: No Rest: Yes Massage: No Activity: No T.E.N.S.: No Heat  Application: No Leg drop or elevation: No Is the Current Pain Management Adequate: Inadequate How does your wound impact your activities of daily livingo Sleep: Yes Bathing: No Appetite: No Relationship With Others: No Bladder Continence: No Emotions: No Bowel Continence: No Work: No Toileting: No Drive: No Dressing: No Hobbies: No Electronic Signature(s) Signed: 11/16/2021 3:54:37 PM By: Carlene Coria RN Entered By: Carlene Coria on 11/16/2021 08:09:52 Flemings, Kaeo T. ([id number]) -------------------------------------------------------------------------------- Patient/Caregiver Education Details Patient Name: [name], Raymar T. Date of Service: 11/16/2021 8:00 AM Medical Record Number: [id number] Patient Account Number: [id number] Date of Birth/Gender: 1940-08-12 (81 y.o. M) Treating RN: Carlene Coria Primary Care Physician: Owens Loffler Other Clinician: Referring Physician: Owens Loffler Treating Physician/Extender: Tito Dine in Treatment: 6 Education Assessment Education Provided To: Patient Education Topics Provided Wound/Skin Impairment: Methods: Explain/Verbal Responses: State content correctly Electronic Signature(s) Signed: 11/16/2021 3:54:37 PM By: Carlene Coria RN Entered By: Carlene Coria on 11/16/2021 08:19:35 Amorita, Zalan  T. ([id number]) -------------------------------------------------------------------------------- Wound Assessment Details Patient Name: Simonis, Kaedin T. Date of Service: 11/16/2021 8:00 AM Medical Record Number: [id number] Patient Account Number: [id number] Date of Birth/Sex: Mar 31, 1941 (81 y.o. M) Treating RN: Carlene Coria Primary Care Candee Hoon: Owens Loffler Other Clinician: Referring Floella Ensz: Owens Loffler Treating Kahlyn Shippey/Extender: Tito Dine in Treatment: 6 Wound Status Wound Number: 1 Primary Venous Leg Ulcer Etiology: Wound Location: Left, Medial Ankle Wound Open Wounding Event: Gradually  Appeared Status: Date Acquired: 08/23/2021 Comorbid Coronary Artery Disease, Deep Vein Thrombosis, Weeks Of Treatment: 6 History: Hypertension, Peripheral Arterial Disease Clustered Wound: No Photos Wound Measurements Length: (cm) 0.2 Width: (cm) 0.1 Depth: (cm) 0.1 Area: (cm) 0.016 Volume: (cm) 0.002 % Reduction in Area: 91.8% % Reduction in Volume: 90% Epithelialization: None Tunneling: No Undermining: No Wound Description Classification: Full Thickness Without Exposed Support Structures Exudate Amount: None Present Foul Odor After Cleansing: No Slough/Fibrino No Wound Bed Granulation Amount: None Present (0%) Exposed Structure Necrotic Amount: Large (67-100%) Fascia Exposed: No Fat Layer (Subcutaneous Tissue) Exposed: Yes Tendon Exposed: No Muscle Exposed: No Joint Exposed: No Bone Exposed: No Treatment Notes Wound #1 (Ankle) Wound Laterality: Left, Medial Cleanser [place] Ancillary Kit - 15 Day Supply Discharge Instruction: Use supplies as instructed; Kit contains: (15) Saline Bullets; (15) 3x3 Gauze; 15 pr Gloves Peri-Wound Care Topical Shafran, Davey T. ([id number]) Gentamicin Discharge Instruction: mix with TCA and apply to peri wound a nd wound bed Triamcinolone Acetonide Cream, 0.1%, 15 (g) tube Discharge Instruction: mix with gentamycin and apply to peri wound a nd wound bed Primary Dressing Hydrofera Blue Ready Transfer Foam, 2.5x2.5 (in/in) Discharge Instruction: Apply Hydrofera Blue Ready to wound bed as directed Secondary Dressing (SILICONE BORDER) Zetuvit Plus SILICONE BORDER Dressing 4x4 (in/in) Discharge Instruction: Please do not put silicone bordered dressings under wraps. Use non-bordered dressing only. Secured With Compression Wrap Compression Stockings Add-Ons tubi grip D Discharge Instruction: double Hydrologist) Signed: 11/16/2021 3:54:37 PM By: Carlene Coria RN Entered By: Carlene Coria on 11/16/2021 08:12:47 Hellmer, Jerrin  T. ([id number]) -------------------------------------------------------------------------------- Vitals Details Patient Name: [name], Yaiden T. Date of Service: 11/16/2021 8:00 AM Medical Record Number: [id number] Patient Account Number: [id number] Date of Birth/Sex: 14-Nov-1940 (81 y.o. M) Treating RN: Carlene Coria Primary Care Austyn Perriello: Owens Loffler Other Clinician: Referring Damek Ende: Owens Loffler Treating Rithika Seel/Extender: Tito Dine in Treatment: 6 Vital Signs Time Taken: 08:08 Temperature (F): 97.8 Height (in): 69 Pulse (bpm): 63 Weight (lbs): 174 Respiratory Rate (breaths/min): 18 Body Mass Index (BMI): 25.7 Blood Pressure (mmHg): 132/64 Reference Range: 80 - 120 mg / dl Electronic Signature(s) Signed: 11/16/2021 3:54:37 PM By: Carlene Coria RN Entered By: Carlene Coria on 11/16/2021 08:09:09

## 2021-11-16 NOTE — Progress Notes (Signed)
DEMONTAE, ANTUNES ([id number]) Visit Report for 11/16/2021 HPI Details Patient Name: [name], [name] Date of Service: 11/16/2021 8:00 AM Medical Record Number: [id number] Patient Account Number: [id number] Date of Birth/Sex: [birth year] (81 y.o. M) Treating RN: Carlene Coria Primary Care Provider: Owens Loffler Other Clinician: Referring Provider: Owens Loffler Treating Provider/Extender: Tito Dine in Treatment: 6 History of Present Illness HPI Description: ADMISSION 10/05/2021 This is an 81 year old man who comes to see [place] for a wound on his left medial ankle that is been there for about a month. He has not been dressing this specifically. The patient is known to have both chronic venous insufficiency and severe arterial insufficiency. For the latter he follows with Dr. Fletcher Anon. He has had extensive stenting including bilateral common iliac arteries, left renal artery and stenting in the left upper extremity. He has a remote smoking history and a lot of medical comorbidities but he is not a diabetic. He tells me that the wound on his left medial ankle started about a month ago or he noticed it about a month ago there was no trauma no bleeding no infection that he is aware of not able to tell me exactly how this would have started. Finds it to be quite painful Past medical history is really quite extensive and includes ischemic cardiomyopathy, COPD, peripheral arterial disease, renal artery stenosis, history of a DVT, chronic kidney disease stage IIIb, hypertension, chronic lower extremity edema, diastolic heart failure. His last arteriogram was in January of this year. He was not felt to have any good endovascular options and he is not felt to be a good surgical candidate. He takes cilostazol for the intermittent claudication. Last noninvasive studies were in December 2022 at which time his ABI in the right was 0.61 with dampened monophasic wounds. They could not obtain a great toe  pressure on the left ABI at 0.56 with a TBI of 0.23 dampened monophasic waveforms 10-12-2021 upon evaluation today patient appears to be doing well with regard to his wounds. He was seen by Dr. Dellia Nims last week. The good news is he is actually showing signs of improvement compared to last week and overall very pleased in that regard. Fortunately there does not appear to be any signs of active infection locally or systemically which is great news. No fevers, chills, nausea, vomiting, or diarrhea. 10-19-2021 upon evaluation today patient appears to be doing well currently in regard to his wound all things considered he still is having quite significant discomfort but again I do believe that he is showing some signs of improvement unfortunately he is also having an issue with a significant rash which is yet to be fully diagnosed I believe. To be honest looking at it today and discussing the way things are going I really feel like he may have some psoriasis believe the palm involvement which is generally not something we see with just eczema or atopic dermatitis. Nonetheless all this was discussed with the patient out the details of this and the plan as well. Either way I do believe that the patient is going to require further dermatologic follow-up and treatment and he actually has an appointment with a second opinion dermatologist tomorrow in [place]. 10-25-2021 upon evaluation patient appears to be doing well with regard to his wound. This actually showed signs of significant improvement which is great news. He did go see the dermatologist last week they feel that his issue probably is a staph infection. They did a swab we do not  know if this is MRSA or just general staph at this point. He was placed on doxycycline however and is doing significantly better. There is no signs of active infection locally or systemically at this time. 11-02-2021 upon evaluation today patient appears to be doing well with  regard to his wound. He has been tolerating the dressing changes without complication. Fortunately I think we are on the right track here. There does not appear to be any evidence of active infection locally or systemically at this time. 11-09-2021 upon evaluation today patient appears to be doing well currently in regard to his wound. Unfortunately his breathing is not very good at all. He has significant wheezing and to be honest he has quite a bit of swelling in the lower extremities as well. My question is whether or not this may indeed be a congestive heart failure exacerbation or some other respiratory issue nonetheless his oxygen saturation is running a little bit lower than normal and he seems to be more labored in his breathing compared to normal. I did voice this to him today and his wife was present during the conversation and we discussed options that would be detailed in the plan. 7/25; small wound on the left medial ankle minimal depth. He is using gentamicin Hydrofera Blue and Tubigrip to control swelling. The patient has severe venous hypertension and also significant PAD which is not amenable to any revascularization Electronic Signature(s) Signed: 11/16/2021 4:13:45 PM By: Linton Ham MD Entered By: Linton Ham on 11/16/2021 12:47:13 Coultas, Nieves T. ([id number]) -------------------------------------------------------------------------------- Physical Exam Details Patient Name: [name], [name] Date of Service: 11/16/2021 8:00 AM Medical Record Number: [id number] Patient Account Number: [id number] Date of Birth/Sex: Feb 22, 1941 (81 y.o. M) Treating RN: Carlene Coria Primary Care Provider: Owens Loffler Other Clinician: Referring Provider: Owens Loffler Treating Provider/Extender: Tito Dine in Treatment: 6 Constitutional Sitting or standing Blood Pressure is within target range for patient.. Pulse regular and within target range for patient.Marland Kitchen Respirations  regular, non- labored and within target range.Marland Kitchen appears in no distress. Cardiovascular Pedal pulses Absent on the left, popliteal pulse also absent on the left. Notes Wound exam; left medial ankle small open wound, too small to probe. Cannot really see the base of this. Per intake nurse this has been getting smaller. There is no evidence of surrounding infection. Evidence of marked venous hypertension the area with dilated veins and venules, fortunately no bleeding. Electronic Signature(s) Signed: 11/16/2021 4:13:45 PM By: Linton Ham MD Entered By: Linton Ham on 11/16/2021 08:23:05 [name], [name] ([id number]) -------------------------------------------------------------------------------- Physician Orders Details Patient Name: [name], [name] Date of Service: 11/16/2021 8:00 AM Medical Record Number: [id number] Patient Account Number: [id number] Date of Birth/Sex: Oct 05, 1940 (80 y.o. M) Treating RN: Carlene Coria Primary Care Provider: Owens Loffler Other Clinician: Referring Provider: Owens Loffler Treating Provider/Extender: Tito Dine in Treatment: 6 Verbal / Phone Orders: No Diagnosis Coding Follow-up Appointments o Return Appointment in 1 week. Bathing/ Shower/ Hygiene o May shower; gently cleanse wound with antibacterial soap, rinse and pat dry prior to dressing wounds Edema Control - Lymphedema / Segmental Compressive Device / Other o Tubigrip double layer applied - size D o Elevate, Exercise Daily and Avoid Standing for Long Periods of Time. o Elevate legs to the level of the heart and pump ankles as often as possible o Elevate leg(s) parallel to the floor when sitting. Wound Treatment Wound #1 - Ankle Wound Laterality: Left, Medial Cleanser: Byram Ancillary Kit - 15 Day Supply (Generic) 3  x Per Week/30 Days Discharge Instructions: Use supplies as instructed; Kit contains: (15) Saline Bullets; (15) 3x3 Gauze; 15 pr Gloves Topical:  Gentamicin 3 x Per Week/30 Days Discharge Instructions: mix with TCA and apply to peri wound a nd wound bed Topical: Triamcinolone Acetonide Cream, 0.1%, 15 (g) tube 3 x Per Week/30 Days Discharge Instructions: mix with gentamycin and apply to peri wound a nd wound bed Primary Dressing: Hydrofera Blue Ready Transfer Foam, 2.5x2.5 (in/in) 3 x Per Week/30 Days Discharge Instructions: Apply Hydrofera Blue Ready to wound bed as directed Secondary Dressing: (SILICONE BORDER) Zetuvit Plus SILICONE BORDER Dressing 4x4 (in/in) (Generic) 3 x Per Week/30 Days Discharge Instructions: Please do not put silicone bordered dressings under wraps. Use non-bordered dressing only. Add-Ons: tubi grip D 3 x Per Week/30 Days Discharge Instructions: double layer Electronic Signature(s) Signed: 11/16/2021 3:54:37 PM By: Carlene Coria RN Signed: 11/16/2021 4:13:45 PM By: Linton Ham MD Entered By: Carlene Coria on 11/16/2021 08:17:34 [name], [name] ([id number]) -------------------------------------------------------------------------------- Problem List Details Patient Name: [name], [name] Date of Service: 11/16/2021 8:00 AM Medical Record Number: [id number] Patient Account Number: [id number] Date of Birth/Sex: July 18, 1940 (81 y.o. M) Treating RN: Carlene Coria Primary Care Provider: Owens Loffler Other Clinician: Referring Provider: Owens Loffler Treating Provider/Extender: Tito Dine in Treatment: 6 Active Problems ICD-10 Encounter Code Description Active Date MDM Diagnosis I87.332 Chronic venous hypertension (idiopathic) with ulcer and inflammation of 10/05/2021 No Yes left lower extremity L97.328 Non-pressure chronic ulcer of left ankle with other specified severity 10/05/2021 No Yes I70.212 Atherosclerosis of native arteries of extremities with intermittent 10/05/2021 No Yes claudication, left leg Inactive Problems Resolved Problems Electronic Signature(s) Signed: 11/16/2021 4:13:45 PM  By: Linton Ham MD Entered By: Linton Ham on 11/16/2021 08:20:56 [name], [name] ([id number]) -------------------------------------------------------------------------------- Progress Note Details Patient Name: [name], [name] Date of Service: 11/16/2021 8:00 AM Medical Record Number: [id number] Patient Account Number: [id number] Date of Birth/Sex: 1940-05-26 (81 y.o. M) Treating RN: Carlene Coria Primary Care Provider: Owens Loffler Other Clinician: Referring Provider: Owens Loffler Treating Provider/Extender: Tito Dine in Treatment: 6 Subjective History of Present Illness (HPI) ADMISSION 10/05/2021 This is an 81 year old man who comes to see [place] for a wound on his left medial ankle that is been there for about a month. He has not been dressing this specifically. The patient is known to have both chronic venous insufficiency and severe arterial insufficiency. For the latter he follows with Dr. Fletcher Anon. He has had extensive stenting including bilateral common iliac arteries, left renal artery and stenting in the left upper extremity. He has a remote smoking history and a lot of medical comorbidities but he is not a diabetic. He tells me that the wound on his left medial ankle started about a month ago or he noticed it about a month ago there was no trauma no bleeding no infection that he is aware of not able to tell me exactly how this would have started. Finds it to be quite painful Past medical history is really quite extensive and includes ischemic cardiomyopathy, COPD, peripheral arterial disease, renal artery stenosis, history of a DVT, chronic kidney disease stage IIIb, hypertension, chronic lower extremity edema, diastolic heart failure. His last arteriogram was in January of this year. He was not felt to have any good endovascular options and he is not felt to be a good surgical candidate. He takes cilostazol for the intermittent claudication. Last noninvasive  studies were in December 2022 at which time his ABI in the  right was 0.61 with dampened monophasic wounds. They could not obtain a great toe pressure on the left ABI at 0.56 with a TBI of 0.23 dampened monophasic waveforms 10-12-2021 upon evaluation today patient appears to be doing well with regard to his wounds. He was seen by Dr. Dellia Nims last week. The good news is he is actually showing signs of improvement compared to last week and overall very pleased in that regard. Fortunately there does not appear to be any signs of active infection locally or systemically which is great news. No fevers, chills, nausea, vomiting, or diarrhea. 10-19-2021 upon evaluation today patient appears to be doing well currently in regard to his wound all things considered he still is having quite significant discomfort but again I do believe that he is showing some signs of improvement unfortunately he is also having an issue with a significant rash which is yet to be fully diagnosed I believe. To be honest looking at it today and discussing the way things are going I really feel like he may have some psoriasis believe the palm involvement which is generally not something we see with just eczema or atopic dermatitis. Nonetheless all this was discussed with the patient out the details of this and the plan as well. Either way I do believe that the patient is going to require further dermatologic follow-up and treatment and he actually has an appointment with a second opinion dermatologist tomorrow in [place]. 10-25-2021 upon evaluation patient appears to be doing well with regard to his wound. This actually showed signs of significant improvement which is great news. He did go see the dermatologist last week they feel that his issue probably is a staph infection. They did a swab we do not know if this is MRSA or just general staph at this point. He was placed on doxycycline however and is doing significantly better. There  is no signs of active infection locally or systemically at this time. 11-02-2021 upon evaluation today patient appears to be doing well with regard to his wound. He has been tolerating the dressing changes without complication. Fortunately I think we are on the right track here. There does not appear to be any evidence of active infection locally or systemically at this time. 11-09-2021 upon evaluation today patient appears to be doing well currently in regard to his wound. Unfortunately his breathing is not very good at all. He has significant wheezing and to be honest he has quite a bit of swelling in the lower extremities as well. My question is whether or not this may indeed be a congestive heart failure exacerbation or some other respiratory issue nonetheless his oxygen saturation is running a little bit lower than normal and he seems to be more labored in his breathing compared to normal. I did voice this to him today and his wife was present during the conversation and we discussed options that would be detailed in the plan. 7/24; small wound on the left medial ankle minimal depth. He is using gentamicin Hydrofera Blue and Tubigrip to control swelling. The patient has severe venous hypertension and also significant PAD which is not amenable to any revascularization Objective Constitutional Sitting or standing Blood Pressure is within target range for patient.. Pulse regular and within target range for patient.Marland Kitchen Respirations regular, non- labored and within target range.Marland Kitchen appears in no distress. [name], [name] ([id number]) Vitals Time Taken: 8:08 AM, Height: 69 in, Weight: 174 lbs, BMI: 25.7, Temperature: 97.8 F, Pulse: 63 bpm, Respiratory Rate: 18  breaths/min, Blood Pressure: 132/64 mmHg. Cardiovascular Pedal pulses Absent on the left, popliteal pulse also absent on the left. General Notes: Wound exam; left medial ankle small open wound, too small to probe. Cannot really see the base of  this. Per intake nurse this has been getting smaller. There is no evidence of surrounding infection. Evidence of marked venous hypertension the area with dilated veins and venules, fortunately no bleeding. Integumentary (Hair, Skin) Wound #1 status is Open. Original cause of wound was Gradually Appeared. The date acquired was: 08/23/2021. The wound has been in treatment 6 weeks. The wound is located on the Left,Medial Ankle. The wound measures 0.2cm length x 0.1cm width x 0.1cm depth; 0.016cm^2 area and 0.002cm^3 volume. There is Fat Layer (Subcutaneous Tissue) exposed. There is no tunneling or undermining noted. There is a none present amount of drainage noted. There is no granulation within the wound bed. There is a large (67-100%) amount of necrotic tissue within the wound bed. Assessment Active Problems ICD-10 Chronic venous hypertension (idiopathic) with ulcer and inflammation of left lower extremity Non-pressure chronic ulcer of left ankle with other specified severity Atherosclerosis of native arteries of extremities with intermittent claudication, left leg Plan Follow-up Appointments: Return Appointment in 1 week. Bathing/ Shower/ Hygiene: May shower; gently cleanse wound with antibacterial soap, rinse and pat dry prior to dressing wounds Edema Control - Lymphedema / Segmental Compressive Device / Other: Tubigrip double layer applied - size D Elevate, Exercise Daily and Avoid Standing for Long Periods of Time. Elevate legs to the level of the heart and pump ankles as often as possible Elevate leg(s) parallel to the floor when sitting. WOUND #1: - Ankle Wound Laterality: Left, Medial Cleanser: Byram Ancillary Kit - 15 Day Supply (Generic) 3 x Per Week/30 Days Discharge Instructions: Use supplies as instructed; Kit contains: (15) Saline Bullets; (15) 3x3 Gauze; 15 pr Gloves Topical: Gentamicin 3 x Per Week/30 Days Discharge Instructions: mix with TCA and apply to peri wound a nd  wound bed Topical: Triamcinolone Acetonide Cream, 0.1%, 15 (g) tube 3 x Per Week/30 Days Discharge Instructions: mix with gentamycin and apply to peri wound a nd wound bed Primary Dressing: Hydrofera Blue Ready Transfer Foam, 2.5x2.5 (in/in) 3 x Per Week/30 Days Discharge Instructions: Apply Hydrofera Blue Ready to wound bed as directed Secondary Dressing: (SILICONE BORDER) Zetuvit Plus SILICONE BORDER Dressing 4x4 (in/in) (Generic) 3 x Per Week/30 Days Discharge Instructions: Please do not put silicone bordered dressings under wraps. Use non-bordered dressing only. Add-Ons: tubi grip D 3 x Per Week/30 Days Discharge Instructions: double layer 1. Very small remanent wound. 2. This appears to be improving by observation of the patient and his wife and are intake nurse. I did not change the dressing which is gentamicin Hydrofera Blue and Tubigrip. The swelling control seems adequate 3. He has short distance claudication as well Electronic Signature(s) Signed: 11/16/2021 4:13:45 PM By: Linton Ham MD Entered By: Linton Ham on 11/16/2021 08:24:00 [name], [name] ([id number]) -------------------------------------------------------------------------------- SuperBill Details Patient Name: [name], [name] Date of Service: 11/16/2021 Medical Record Number: [id number] Patient Account Number: [id number] Date of Birth/Sex: [birth year] (81 y.o. M) Treating RN: Carlene Coria Primary Care Provider: Owens Loffler Other Clinician: Referring Provider: Owens Loffler Treating Provider/Extender: Tito Dine in Treatment: 6 Diagnosis Coding ICD-10 Codes Code Description [phone] Chronic venous hypertension (idiopathic) with ulcer and inflammation of left lower extremity L97.328 Non-pressure chronic ulcer of left ankle with other specified severity I70.212 Atherosclerosis of native arteries of extremities with intermittent  claudication, left leg Facility Procedures CPT4 Code:  [id number] Description: 95369 - WOUND CARE VISIT-LEV 3 EST PT Modifier: Quantity: 1 Physician Procedures CPT4 Code Description: [id number] 99213 - WC PHYS LEVEL 3 - EST PT Modifier: Quantity: 1 CPT4 Code Description: ICD-10 Diagnosis Description L97.328 Non-pressure chronic ulcer of left ankle with other specified severity I87.332 Chronic venous hypertension (idiopathic) with ulcer and inflammation of left I70.212 Atherosclerosis of native  arteries of extremities with intermittent claudica Modifier: lower extremity tion, left leg Quantity: Electronic Signature(s) Signed: 11/16/2021 4:13:45 PM By: Linton Ham MD Entered By: Linton Ham on 11/16/2021 08:24:21

## 2021-11-17 ENCOUNTER — Telehealth: Payer: Self-pay

## 2021-11-17 NOTE — Telephone Encounter (Signed)
[name] notified as instructed by telephone.  He states right now he is fine but will call us back if anything changes with his breathing or he becomes SOB.  FYI to Dr. [name].

## 2021-11-17 NOTE — Telephone Encounter (Signed)
[facility] - Client TELEPHONE ADVICE RECORD [facility] Patient Name: [name] Gender: Male DOB: 1940/04/29 Age: 81 Y 37 M 18 D Return Phone Number: [phone] (Primary) Address: City/ State/ Zip: [place]  [address] Client [facility] - Client Client Site [facility] - Day Provider Copland, Frederico Hamman - MD Contact Type Call Who Is Calling Patient / Member / Family / Caregiver Call Type Triage / Clinical Caller Name Tobin Witucki Relationship To Patient Spouse Return Phone Number [phone] (Primary) Chief Complaint OVERDOSE took too much medication at once Reason for Call Symptomatic / Request for [place] states husband was treated for shortness of breath. He had been Rx predinisone '20mg'$ . He had been taking his Rx 3x day/5days. His intructiosn were to take 1/day/ 15 days. He no longer has any medication. He took all his Rx in 5days instead of 15days. Translation No Nurse Assessment Nurse: D'Heur Lucia Gaskins, RN, Adrienne Date/Time (Eastern Time): 11/17/2021 9:24:53 AM Confirm and document reason for call. If symptomatic, describe symptoms. ---Caller states her husband was treated for shortness of breath. He had been prescribed prednisone '20mg'$  at the ED. He had been taking his prescription 3x day/5 days as directed by bottle instructions. His instructions on the ED discharge papers were to take 1/ day for 15 days. He no longer has any medication. He took all his Rx in 5 days instead of 15 days & spouse states they knew enough to taper the dose. No new or worsening symptoms. Spouse states he is sleepy but this is normal. She is upset about the discrepancy between the ED instructions and the [facility] dosing on the bottle. She wants this matter to be documented & in patient's record. Advised her of withdrawal signs to look for. Does the patient have any new or  worsening symptoms? ---No Disp. Time Eilene Ghazi Time) Disposition Final User 11/17/2021 9:22:21 AM Send to Urgent Annia Friendly 11/17/2021 9:35:51 AM Clinical Call Yes D'Heur Lucia Gaskins, RN, [facility] Final Disposition 11/17/2021 9:35:51 AM Clinical Call Yes D'Heur Lucia Gaskins, RN, [facility]

## 2021-11-17 NOTE — Telephone Encounter (Signed)
Sending to Dr [name] and Butch Penny CMA.

## 2021-11-17 NOTE — Telephone Encounter (Signed)
Notes reviewed.  As long as his breathing is back to baseline, then I do not think that anything else needs to be done.  But - if still with shortness of breath, then I can extend the course of prednisone.

## 2021-11-18 ENCOUNTER — Ambulatory Visit: Payer: Medicare Other

## 2021-11-18 ENCOUNTER — Ambulatory Visit (INDEPENDENT_AMBULATORY_CARE_PROVIDER_SITE_OTHER): Payer: Medicare Other

## 2021-11-18 DIAGNOSIS — Z7901 Long term (current) use of anticoagulants: Secondary | ICD-10-CM

## 2021-11-18 LAB — POCT INR: INR: 2.1 (ref 2.0–3.0)

## 2021-11-18 NOTE — Progress Notes (Addendum)
Continue 1/2 tablet daily except take 1 tablet on Sundays and Wednesdays.  Recheck in 2 week.

## 2021-11-18 NOTE — Patient Instructions (Addendum)
Pre visit review using our clinic review tool, if applicable. No additional management support is needed unless otherwise documented below in the visit note.  Continue 1/2 tablet daily except take 1 tablet on Sundays and Wednesdays.  Recheck in 2 week.

## 2021-11-23 ENCOUNTER — Encounter: Payer: Medicare Other | Attending: Physician Assistant | Admitting: Physician Assistant

## 2021-11-23 DIAGNOSIS — N1832 Chronic kidney disease, stage 3b: Secondary | ICD-10-CM | POA: Insufficient documentation

## 2021-11-23 DIAGNOSIS — I5032 Chronic diastolic (congestive) heart failure: Secondary | ICD-10-CM | POA: Insufficient documentation

## 2021-11-23 DIAGNOSIS — L821 Other seborrheic keratosis: Secondary | ICD-10-CM | POA: Diagnosis not present

## 2021-11-23 DIAGNOSIS — I87332 Chronic venous hypertension (idiopathic) with ulcer and inflammation of left lower extremity: Secondary | ICD-10-CM | POA: Insufficient documentation

## 2021-11-23 DIAGNOSIS — W19XXXA Unspecified fall, initial encounter: Secondary | ICD-10-CM | POA: Insufficient documentation

## 2021-11-23 DIAGNOSIS — L97328 Non-pressure chronic ulcer of left ankle with other specified severity: Secondary | ICD-10-CM | POA: Diagnosis not present

## 2021-11-23 DIAGNOSIS — S51812A Laceration without foreign body of left forearm, initial encounter: Secondary | ICD-10-CM | POA: Insufficient documentation

## 2021-11-23 DIAGNOSIS — I70212 Atherosclerosis of native arteries of extremities with intermittent claudication, left leg: Secondary | ICD-10-CM | POA: Diagnosis not present

## 2021-11-23 DIAGNOSIS — I130 Hypertensive heart and chronic kidney disease with heart failure and stage 1 through stage 4 chronic kidney disease, or unspecified chronic kidney disease: Secondary | ICD-10-CM | POA: Diagnosis not present

## 2021-11-23 DIAGNOSIS — L0889 Other specified local infections of the skin and subcutaneous tissue: Secondary | ICD-10-CM | POA: Diagnosis not present

## 2021-11-23 DIAGNOSIS — Z86718 Personal history of other venous thrombosis and embolism: Secondary | ICD-10-CM | POA: Diagnosis not present

## 2021-11-23 DIAGNOSIS — L2089 Other atopic dermatitis: Secondary | ICD-10-CM | POA: Diagnosis not present

## 2021-11-23 DIAGNOSIS — L011 Impetiginization of other dermatoses: Secondary | ICD-10-CM | POA: Diagnosis not present

## 2021-11-23 NOTE — Progress Notes (Addendum)
JAMAAR, HOWES ([id number]) Visit Report for 11/23/2021 Arrival Information Details Patient Name: [name], [name] Date of Service: 11/23/2021 8:00 AM Medical Record Number: [id number] Patient Account Number: [id number] Date of Birth/Sex: Apr 03, 1941 (81 y.o. M) Treating RN: Carlene Coria Primary Care Adelina Collard: Owens Loffler Other Clinician: Referring Tesha Archambeau: Owens Loffler Treating Fumio Vandam/Extender: Skipper Cliche in Treatment: 7 Visit Information History Since Last Visit All ordered tests and consults were completed: No Patient Arrived: Ambulatory Added or deleted any medications: No Arrival Time: 08:03 Any new allergies or adverse reactions: No Accompanied By: wife Had a fall or experienced change in No Transfer Assistance: None activities of daily living that may affect Patient Identification Verified: Yes risk of falls: Secondary Verification Process Completed: Yes Signs or symptoms of abuse/neglect since last visito No Patient Requires Transmission-Based No Hospitalized since last visit: No Precautions: Implantable device outside of the clinic excluding No Patient Has Alerts: Yes cellular tissue based products placed in the center Patient Alerts: Patient on Blood since last visit: Thinner Has Dressing in Place as Prescribed: Yes L ABI .56 04/22/21 Pain Present Now: No R ABI .61 04/22/21 Electronic Signature(s) Signed: 11/23/2021 4:03:12 PM By: Carlene Coria RN Entered By: Carlene Coria on 11/23/2021 08:07:34 Straw, Marshun T. ([id number]) -------------------------------------------------------------------------------- Clinic Level of Care Assessment Details Patient Name: [name], Addison T. Date of Service: 11/23/2021 8:00 AM Medical Record Number: [id number] Patient Account Number: [id number] Date of Birth/Sex: Aug 16, 1940 (81 y.o. M) Treating RN: Carlene Coria Primary Care Rola Lennon: Owens Loffler Other Clinician: Referring Russie Gulledge: Owens Loffler Treating  Tell Rozelle/Extender: Skipper Cliche in Treatment: 7 Clinic Level of Care Assessment Items TOOL 4 Quantity Score X - Use when only an EandM is performed on FOLLOW-UP visit 1 0 ASSESSMENTS - Nursing Assessment / Reassessment X - Reassessment of Co-morbidities (includes updates in patient status) 1 10 X- 1 5 Reassessment of Adherence to Treatment Plan ASSESSMENTS - Wound and Skin Assessment / Reassessment X - Simple Wound Assessment / Reassessment - one wound 1 5 '[]'$  - 0 Complex Wound Assessment / Reassessment - multiple wounds '[]'$  - 0 Dermatologic / Skin Assessment (not related to wound area) ASSESSMENTS - Focused Assessment '[]'$  - Circumferential Edema Measurements - multi extremities 0 '[]'$  - 0 Nutritional Assessment / Counseling / Intervention '[]'$  - 0 Lower Extremity Assessment (monofilament, tuning fork, pulses) '[]'$  - 0 Peripheral Arterial Disease Assessment (using hand held doppler) ASSESSMENTS - Ostomy and/or Continence Assessment and Care '[]'$  - Incontinence Assessment and Management 0 '[]'$  - 0 Ostomy Care Assessment and Management (repouching, etc.) PROCESS - Coordination of Care X - Simple Patient / Family Education for ongoing care 1 15 '[]'$  - 0 Complex (extensive) Patient / Family Education for ongoing care '[]'$  - 0 Staff obtains Programmer, systems, Records, Test Results / Process Orders '[]'$  - 0 Staff telephones HHA, Nursing Homes / Clarify orders / etc '[]'$  - 0 Routine Transfer to another Facility (non-emergent condition) '[]'$  - 0 Routine Hospital Admission (non-emergent condition) '[]'$  - 0 New Admissions / Biomedical engineer / Ordering NPWT, Apligraf, etc. '[]'$  - 0 Emergency Hospital Admission (emergent condition) X- 1 10 Simple Discharge Coordination '[]'$  - 0 Complex (extensive) Discharge Coordination PROCESS - Special Needs '[]'$  - Pediatric / Minor Patient Management 0 '[]'$  - 0 Isolation Patient Management '[]'$  - 0 Hearing / Language / Visual special needs '[]'$  - 0 Assessment of  Community assistance (transportation, D/C planning, etc.) '[]'$  - 0 Additional assistance / Altered mentation '[]'$  - 0 Support Surface(s) Assessment (bed, cushion, seat, etc.) INTERVENTIONS - Wound  Cleansing / Measurement Woolridge, Altariq T. ([id number]) X- 1 5 Simple Wound Cleansing - one wound '[]'$  - 0 Complex Wound Cleansing - multiple wounds X- 1 5 Wound Imaging (photographs - any number of wounds) '[]'$  - 0 Wound Tracing (instead of photographs) X- 1 5 Simple Wound Measurement - one wound '[]'$  - 0 Complex Wound Measurement - multiple wounds INTERVENTIONS - Wound Dressings '[]'$  - Small Wound Dressing one or multiple wounds 0 '[]'$  - 0 Medium Wound Dressing one or multiple wounds '[]'$  - 0 Large Wound Dressing one or multiple wounds '[]'$  - 0 Application of Medications - topical '[]'$  - 0 Application of Medications - injection INTERVENTIONS - Miscellaneous '[]'$  - External ear exam 0 '[]'$  - 0 Specimen Collection (cultures, biopsies, blood, body fluids, etc.) '[]'$  - 0 Specimen(s) / Culture(s) sent or taken to Lab for analysis '[]'$  - 0 Patient Transfer (multiple staff / Civil Service fast streamer / Similar devices) '[]'$  - 0 Simple Staple / Suture removal (25 or less) '[]'$  - 0 Complex Staple / Suture removal (26 or more) '[]'$  - 0 Hypo / Hyperglycemic Management (close monitor of Blood Glucose) '[]'$  - 0 Ankle / Brachial Index (ABI) - do not check if billed separately X- 1 5 Vital Signs Has the patient been seen at the hospital within the last three years: Yes Total Score: 65 Level Of Care: New/Established - Level 2 Electronic Signature(s) Signed: 11/23/2021 4:03:12 PM By: Carlene Coria RN Entered By: Carlene Coria on 11/23/2021 08:29:13 Capps, Tyhir T. ([id number]) -------------------------------------------------------------------------------- Encounter Discharge Information Details Patient Name: [name], [name] Date of Service: 11/23/2021 8:00 AM Medical Record Number: [id number] Patient Account Number: [id number] Date of  Birth/Sex: 10/01/1940 (81 y.o. M) Treating RN: Carlene Coria Primary Care Jasaun Carn: Owens Loffler Other Clinician: Referring Betti Goodenow: Owens Loffler Treating Mckynzi Cammon/Extender: Skipper Cliche in Treatment: 7 Encounter Discharge Information Items Discharge Condition: Stable Ambulatory Status: Ambulatory Discharge Destination: Home Transportation: Private Auto Accompanied By: wife Schedule Follow-up Appointment: Yes Clinical Summary of Care: Electronic Signature(s) Signed: 11/23/2021 4:03:12 PM By: Carlene Coria RN Entered By: Carlene Coria on 11/23/2021 08:30:25 Mellone, Christine T. ([id number]) -------------------------------------------------------------------------------- Lower Extremity Assessment Details Patient Name: President, Kaysan T. Date of Service: 11/23/2021 8:00 AM Medical Record Number: [id number] Patient Account Number: [id number] Date of Birth/Sex: 09/17/1940 (81 y.o. M) Treating RN: Carlene Coria Primary Care Drury Ardizzone: Owens Loffler Other Clinician: Referring Zelena Bushong: Owens Loffler Treating Elsia Lasota/Extender: Skipper Cliche in Treatment: 7 Electronic Signature(s) Signed: 11/23/2021 4:03:12 PM By: Carlene Coria RN Entered By: Carlene Coria on 11/23/2021 08:13:55 Hardt, Kelwin T. ([id number]) -------------------------------------------------------------------------------- Multi Wound Chart Details Patient Name: Price, [name] Date of Service: 11/23/2021 8:00 AM Medical Record Number: [id number] Patient Account Number: [id number] Date of Birth/Sex: Sep 16, 1940 (81 y.o. M) Treating RN: Carlene Coria Primary Care Marcie Shearon: Owens Loffler Other Clinician: Referring Rajan Burgard: Owens Loffler Treating Deavin Forst/Extender: Skipper Cliche in Treatment: 7 Vital Signs Height(in): 69 Pulse(bpm): 96 Weight(lbs): 174 Blood Pressure(mmHg): 129/72 Body Mass Index(BMI): 25.7 Temperature(F): 97.7 Respiratory Rate(breaths/min): 18 Photos: [N/A:N/A] Wound Location: Left,  Medial Ankle N/A N/A Wounding Event: Gradually Appeared N/A N/A Primary Etiology: Venous Leg Ulcer N/A N/A Comorbid History: Coronary Artery Disease, Deep Vein N/A N/A Thrombosis, Hypertension, Peripheral Arterial Disease Date Acquired: 08/23/2021 N/A N/A Weeks of Treatment: 7 N/A N/A Wound Status: Open N/A N/A Wound Recurrence: No N/A N/A Measurements L x W x D (cm) 0.2x0.1x0.1 N/A N/A Area (cm) : 0.016 N/A N/A Volume (cm) : 0.002 N/A N/A % Reduction in Area: 91.80% N/A N/A % Reduction in Volume:  90.00% N/A N/A Classification: Full Thickness Without Exposed N/A N/A Support Structures Exudate Amount: None Present N/A N/A Granulation Amount: None Present (0%) N/A N/A Necrotic Amount: Large (67-100%) N/A N/A Exposed Structures: Fat Layer (Subcutaneous Tissue): N/A N/A Yes Fascia: No Tendon: No Muscle: No Joint: No Bone: No Epithelialization: None N/A N/A Treatment Notes Electronic Signature(s) Signed: 11/23/2021 4:03:12 PM By: Carlene Coria RN Entered By: Carlene Coria on 11/23/2021 08:14:11 Halbig, Autrey T. ([id number]) -------------------------------------------------------------------------------- Bryce Details Patient Name: [name], [name] Date of Service: 11/23/2021 8:00 AM Medical Record Number: [id number] Patient Account Number: [id number] Date of Birth/Sex: [birth year] (81 y.o. M) Treating RN: Carlene Coria Primary Care Tailer Volkert: Owens Loffler Other Clinician: Referring Adesuwa Osgood: Owens Loffler Treating Gjon Letarte/Extender: Skipper Cliche in Treatment: 7 Active Inactive Electronic Signature(s) Signed: 11/23/2021 4:03:12 PM By: Carlene Coria RN Entered By: Carlene Coria on 11/23/2021 08:31:36 Bryner, Dacota T. ([id number]) -------------------------------------------------------------------------------- Pain Assessment Details Patient Name: Simington, [name] Date of Service: 11/23/2021 8:00 AM Medical Record Number: [id number] Patient Account Number:  [id number] Date of Birth/Sex: 1940-10-19 (81 y.o. M) Treating RN: Carlene Coria Primary Care Milus Fritze: Owens Loffler Other Clinician: Referring Talor Cheema: Owens Loffler Treating Dora Simeone/Extender: Skipper Cliche in Treatment: 7 Active Problems Location of Pain Severity and Description of Pain Patient Has Paino No Site Locations Pain Management and Medication Current Pain Management: Electronic Signature(s) Signed: 11/23/2021 4:03:12 PM By: Carlene Coria RN Entered By: Carlene Coria on 11/23/2021 08:08:00 Zarazua, Jermon T. ([id number]) -------------------------------------------------------------------------------- Patient/Caregiver Education Details Patient Name: [name], [name] Date of Service: 11/23/2021 8:00 AM Medical Record Number: [id number] Patient Account Number: [id number] Date of Birth/Gender: 1940-09-16 (81 y.o. M) Treating RN: Carlene Coria Primary Care Physician: Owens Loffler Other Clinician: Referring Physician: Owens Loffler Treating Physician/Extender: Skipper Cliche in Treatment: 7 Education Assessment Education Provided To: Patient Education Topics Provided Wound/Skin Impairment: Methods: Explain/Verbal Responses: State content correctly Electronic Signature(s) Signed: 11/23/2021 4:03:12 PM By: Carlene Coria RN Entered By: Carlene Coria on 11/23/2021 08:29:51 Setter, Olof T. ([id number]) -------------------------------------------------------------------------------- Wound Assessment Details Patient Name: Olesen, [name] Date of Service: 11/23/2021 8:00 AM Medical Record Number: [id number] Patient Account Number: [id number] Date of Birth/Sex: Sep 03, 1940 (81 y.o. M) Treating RN: Carlene Coria Primary Care Cong Hightower: Owens Loffler Other Clinician: Referring Juriel Cid: Owens Loffler Treating Briel Gallicchio/Extender: Skipper Cliche in Treatment: 7 Wound Status Wound Number: 1 Primary Venous Leg Ulcer Etiology: Wound Location: Left, Medial Ankle Wound  Healed - Epithelialized Wounding Event: Gradually Appeared Status: Date Acquired: 08/23/2021 Comorbid Coronary Artery Disease, Deep Vein Thrombosis, Weeks Of Treatment: 7 History: Hypertension, Peripheral Arterial Disease Clustered Wound: No Photos Wound Measurements Length: (cm) 0 Width: (cm) 0 Depth: (cm) 0 Area: (cm) 0 Volume: (cm) 0 % Reduction in Area: 100% % Reduction in Volume: 100% Epithelialization: Large (67-100%) Tunneling: No Undermining: No Wound Description Classification: Full Thickness Without Exposed Support Structure Exudate Amount: None Present s Foul Odor After Cleansing: No Slough/Fibrino No Wound Bed Granulation Amount: None Present (0%) Exposed Structure Necrotic Amount: None Present (0%) Fascia Exposed: No Fat Layer (Subcutaneous Tissue) Exposed: No Tendon Exposed: No Muscle Exposed: No Joint Exposed: No Bone Exposed: No Treatment Notes Wound #1 (Ankle) Wound Laterality: Left, Medial Cleanser Peri-Wound Care Topical Primary Dressing KYHEEM, BATHGATE ([id number]) Secondary Dressing Secured With Compression Wrap Compression Stockings Add-Ons Electronic Signature(s) Signed: 11/23/2021 4:03:12 PM By: Carlene Coria RN Entered By: Carlene Coria on 11/23/2021 08:28:02 Hansley, Seiji T. ([id number]) -------------------------------------------------------------------------------- Vitals Details Patient Name: [name], Dez T. Date of Service: 11/23/2021 8:00 AM Medical Record Number:  [id number] Patient Account Number: [id number] Date of Birth/Sex: [birth year] (81 y.o. M) Treating RN: Carlene Coria Primary Care Travonna Swindle: Owens Loffler Other Clinician: Referring Lindwood Mogel: Owens Loffler Treating Dandy Lazaro/Extender: Skipper Cliche in Treatment: 7 Vital Signs Time Taken: 08:07 Temperature (F): 97.7 Height (in): 69 Pulse (bpm): 96 Weight (lbs): 174 Respiratory Rate (breaths/min): 18 Body Mass Index (BMI): 25.7 Blood Pressure (mmHg):  129/72 Reference Range: 80 - 120 mg / dl Electronic Signature(s) Signed: 11/23/2021 4:03:12 PM By: Carlene Coria RN Entered By: Carlene Coria on 11/23/2021 08:07:51

## 2021-11-23 NOTE — Progress Notes (Addendum)
ISHMEAL, RORIE ([id number]) Visit Report for 11/23/2021 Chief Complaint Document Details Patient Name: [name], [name] Date of Service: 11/23/2021 8:00 AM Medical Record Number: [id number] Patient Account Number: [id number] Date of Birth/Sex: 22-May-1940 (81 y.o. M) Treating RN: Carlene Coria Primary Care Provider: Owens Loffler Other Clinician: Referring Provider: Owens Loffler Treating Provider/Extender: Skipper Cliche in Treatment: 7 Information Obtained from: Patient Chief Complaint 10/05/2021; patient is here for review of a wound on the left medial ankle Electronic Signature(s) Signed: 11/23/2021 8:05:47 AM By: Worthy Keeler PA-C Entered By: Worthy Keeler on 11/23/2021 08:05:47 [name], [name] ([id number]) -------------------------------------------------------------------------------- HPI Details Patient Name: [name], [name] Date of Service: 11/23/2021 8:00 AM Medical Record Number: [id number] Patient Account Number: [id number] Date of Birth/Sex: 04-22-1941 (81 y.o. M) Treating RN: Carlene Coria Primary Care Provider: Owens Loffler Other Clinician: Referring Provider: Owens Loffler Treating Provider/Extender: Skipper Cliche in Treatment: 7 History of Present Illness HPI Description: ADMISSION 10/05/2021 This is an 81 year old man who comes to see [place] for a wound on his left medial ankle that is been there for about a month. He has not been dressing this specifically. The patient is known to have both chronic venous insufficiency and severe arterial insufficiency. For the latter he follows with Dr. Fletcher Anon. He has had extensive stenting including bilateral common iliac arteries, left renal artery and stenting in the left upper extremity. He has a remote smoking history and a lot of medical comorbidities but he is not a diabetic. He tells me that the wound on his left medial ankle started about a month ago or he noticed it about a month ago there was no trauma  no bleeding no infection that he is aware of not able to tell me exactly how this would have started. Finds it to be quite painful Past medical history is really quite extensive and includes ischemic cardiomyopathy, COPD, peripheral arterial disease, renal artery stenosis, history of a DVT, chronic kidney disease stage IIIb, hypertension, chronic lower extremity edema, diastolic heart failure. His last arteriogram was in January of this year. He was not felt to have any good endovascular options and he is not felt to be a good surgical candidate. He takes cilostazol for the intermittent claudication. Last noninvasive studies were in December 2022 at which time his ABI in the right was 0.61 with dampened monophasic wounds. They could not obtain a great toe pressure on the left ABI at 0.56 with a TBI of 0.23 dampened monophasic waveforms 10-12-2021 upon evaluation today patient appears to be doing well with regard to his wounds. He was seen by Dr. Dellia Nims last week. The good news is he is actually showing signs of improvement compared to last week and overall very pleased in that regard. Fortunately there does not appear to be any signs of active infection locally or systemically which is great news. No fevers, chills, nausea, vomiting, or diarrhea. 10-19-2021 upon evaluation today patient appears to be doing well currently in regard to his wound all things considered he still is having quite significant discomfort but again I do believe that he is showing some signs of improvement unfortunately he is also having an issue with a significant rash which is yet to be fully diagnosed I believe. To be honest looking at it today and discussing the way things are going I really feel like he may have some psoriasis believe the palm involvement which is generally not something we see with just eczema or atopic dermatitis. Nonetheless all this  was discussed with the patient out the details of this and the plan as  well. Either way I do believe that the patient is going to require further dermatologic follow-up and treatment and he actually has an appointment with a second opinion dermatologist tomorrow in [place]. 10-25-2021 upon evaluation patient appears to be doing well with regard to his wound. This actually showed signs of significant improvement which is great news. He did go see the dermatologist last week they feel that his issue probably is a staph infection. They did a swab we do not know if this is MRSA or just general staph at this point. He was placed on doxycycline however and is doing significantly better. There is no signs of active infection locally or systemically at this time. 11-02-2021 upon evaluation today patient appears to be doing well with regard to his wound. He has been tolerating the dressing changes without complication. Fortunately I think we are on the right track here. There does not appear to be any evidence of active infection locally or systemically at this time. 11-09-2021 upon evaluation today patient appears to be doing well currently in regard to his wound. Unfortunately his breathing is not very good at all. He has significant wheezing and to be honest he has quite a bit of swelling in the lower extremities as well. My question is whether or not this may indeed be a congestive heart failure exacerbation or some other respiratory issue nonetheless his oxygen saturation is running a little bit lower than normal and he seems to be more labored in his breathing compared to normal. I did voice this to him today and his wife was present during the conversation and we discussed options that would be detailed in the plan. 7/25; small wound on the left medial ankle minimal depth. He is using gentamicin Hydrofera Blue and Tubigrip to control swelling. The patient has severe venous hypertension and also significant PAD which is not amenable to any revascularization 11-23-2021 upon  evaluation today patient appears to actually be doing extremely well in fact I think he is healed. Fortunately there does not appear to be any evidence of active infection locally or systemically which is great news and overall I am extremely pleased with where things stand he is not having any pain. Electronic Signature(s) Signed: 11/25/2021 6:01:45 PM By: Worthy Keeler PA-C Entered By: Worthy Keeler on 11/25/2021 18:01:45 Aiken, Luka T. ([id number]) -------------------------------------------------------------------------------- Physical Exam Details Patient Name: Gagan, Dimetrius T. Date of Service: 11/23/2021 8:00 AM Medical Record Number: [id number] Patient Account Number: [id number] Date of Birth/Sex: 1941/02/28 (81 y.o. M) Treating RN: Carlene Coria Primary Care Provider: Owens Loffler Other Clinician: Referring Provider: Owens Loffler Treating Provider/Extender: Skipper Cliche in Treatment: 7 Constitutional Well-nourished and well-hydrated in no acute distress. Respiratory normal breathing without difficulty. Psychiatric this patient is able to make decisions and demonstrates good insight into disease process. Alert and Oriented x 3. pleasant and cooperative. Notes Patient's wound actually appears to be completely healed which is great news.. Electronic Signature(s) Signed: 11/25/2021 6:02:53 PM By: Worthy Keeler PA-C Entered By: Worthy Keeler on 11/25/2021 18:02:53 [name], [name] ([id number]) -------------------------------------------------------------------------------- Physician Orders Details Patient Name: [name], [name] Date of Service: 11/23/2021 8:00 AM Medical Record Number: [id number] Patient Account Number: [id number] Date of Birth/Sex: 24-Jan-1941 (81 y.o. M) Treating RN: Carlene Coria Primary Care Provider: Owens Loffler Other Clinician: Referring Provider: Owens Loffler Treating Provider/Extender: Skipper Cliche in Treatment: 7 Verbal / Phone  Orders:  No Diagnosis Coding ICD-10 Coding Code Description I87.332 Chronic venous hypertension (idiopathic) with ulcer and inflammation of left lower extremity L97.328 Non-pressure chronic ulcer of left ankle with other specified severity I70.212 Atherosclerosis of native arteries of extremities with intermittent claudication, left leg Discharge From [facility] Services o Discharge from [place] [facility] Complete - apply lotion daily Electronic Signature(s) Signed: 11/23/2021 4:03:12 PM By: Carlene Coria RN Signed: 11/25/2021 6:41:29 PM By: Worthy Keeler PA-C Entered By: Carlene Coria on 11/23/2021 08:28:40 [name], [name] ([id number]) -------------------------------------------------------------------------------- Problem List Details Patient Name: Demilio, Rasheen T. Date of Service: 11/23/2021 8:00 AM Medical Record Number: [id number] Patient Account Number: [id number] Date of Birth/Sex: [birth year] (82 y.o. M) Treating RN: Carlene Coria Primary Care Provider: Owens Loffler Other Clinician: Referring Provider: Owens Loffler Treating Provider/Extender: Skipper Cliche in Treatment: 7 Active Problems ICD-10 Encounter Code Description Active Date MDM Diagnosis I87.332 Chronic venous hypertension (idiopathic) with ulcer and inflammation of 10/05/2021 No Yes left lower extremity L97.328 Non-pressure chronic ulcer of left ankle with other specified severity 10/05/2021 No Yes I70.212 Atherosclerosis of native arteries of extremities with intermittent 10/05/2021 No Yes claudication, left leg Inactive Problems Resolved Problems Electronic Signature(s) Signed: 11/23/2021 8:05:43 AM By: Worthy Keeler PA-C Entered By: Worthy Keeler on 11/23/2021 08:05:43 [name], [name] ([id number]) -------------------------------------------------------------------------------- Progress Note Details Patient Name: [name], [name] Date of Service: 11/23/2021 8:00 AM Medical Record Number:  [id number] Patient Account Number: [id number] Date of Birth/Sex: Apr 17, 1941 (81 y.o. M) Treating RN: Carlene Coria Primary Care Provider: Owens Loffler Other Clinician: Referring Provider: Owens Loffler Treating Provider/Extender: Skipper Cliche in Treatment: 7 Subjective Chief Complaint Information obtained from Patient 10/05/2021; patient is here for review of a wound on the left medial ankle History of Present Illness (HPI) ADMISSION 10/05/2021 This is an 81 year old man who comes to see [place] for a wound on his left medial ankle that is been there for about a month. He has not been dressing this specifically. The patient is known to have both chronic venous insufficiency and severe arterial insufficiency. For the latter he follows with Dr. Fletcher Anon. He has had extensive stenting including bilateral common iliac arteries, left renal artery and stenting in the left upper extremity. He has a remote smoking history and a lot of medical comorbidities but he is not a diabetic. He tells me that the wound on his left medial ankle started about a month ago or he noticed it about a month ago there was no trauma no bleeding no infection that he is aware of not able to tell me exactly how this would have started. Finds it to be quite painful Past medical history is really quite extensive and includes ischemic cardiomyopathy, COPD, peripheral arterial disease, renal artery stenosis, history of a DVT, chronic kidney disease stage IIIb, hypertension, chronic lower extremity edema, diastolic heart failure. His last arteriogram was in January of this year. He was not felt to have any good endovascular options and he is not felt to be a good surgical candidate. He takes cilostazol for the intermittent claudication. Last noninvasive studies were in December 2022 at which time his ABI in the right was 0.61 with dampened monophasic wounds. They could not obtain a great toe pressure on the left ABI at 0.56 with a  TBI of 0.23 dampened monophasic waveforms 10-12-2021 upon evaluation today patient appears to be doing well with regard to his wounds. He was seen by Dr. Dellia Nims last week. The good news is he is actually showing  signs of improvement compared to last week and overall very pleased in that regard. Fortunately there does not appear to be any signs of active infection locally or systemically which is great news. No fevers, chills, nausea, vomiting, or diarrhea. 10-19-2021 upon evaluation today patient appears to be doing well currently in regard to his wound all things considered he still is having quite significant discomfort but again I do believe that he is showing some signs of improvement unfortunately he is also having an issue with a significant rash which is yet to be fully diagnosed I believe. To be honest looking at it today and discussing the way things are going I really feel like he may have some psoriasis believe the palm involvement which is generally not something we see with just eczema or atopic dermatitis. Nonetheless all this was discussed with the patient out the details of this and the plan as well. Either way I do believe that the patient is going to require further dermatologic follow-up and treatment and he actually has an appointment with a second opinion dermatologist tomorrow in [place]. 10-25-2021 upon evaluation patient appears to be doing well with regard to his wound. This actually showed signs of significant improvement which is great news. He did go see the dermatologist last week they feel that his issue probably is a staph infection. They did a swab we do not know if this is MRSA or just general staph at this point. He was placed on doxycycline however and is doing significantly better. There is no signs of active infection locally or systemically at this time. 11-02-2021 upon evaluation today patient appears to be doing well with regard to his wound. He has been  tolerating the dressing changes without complication. Fortunately I think we are on the right track here. There does not appear to be any evidence of active infection locally or systemically at this time. 11-09-2021 upon evaluation today patient appears to be doing well currently in regard to his wound. Unfortunately his breathing is not very good at all. He has significant wheezing and to be honest he has quite a bit of swelling in the lower extremities as well. My question is whether or not this may indeed be a congestive heart failure exacerbation or some other respiratory issue nonetheless his oxygen saturation is running a little bit lower than normal and he seems to be more labored in his breathing compared to normal. I did voice this to him today and his wife was present during the conversation and we discussed options that would be detailed in the plan. 7/25; small wound on the left medial ankle minimal depth. He is using gentamicin Hydrofera Blue and Tubigrip to control swelling. The patient has severe venous hypertension and also significant PAD which is not amenable to any revascularization 11-23-2021 upon evaluation today patient appears to actually be doing extremely well in fact I think he is healed. Fortunately there does not appear to be any evidence of active infection locally or systemically which is great news and overall I am extremely pleased with where things stand he is not having any pain. [name], [name] TMarland Kitchen ([id number]) Objective Constitutional Well-nourished and well-hydrated in no acute distress. Vitals Time Taken: 8:07 AM, Height: 69 in, Weight: 174 lbs, BMI: 25.7, Temperature: 97.7 F, Pulse: 96 bpm, Respiratory Rate: 18 breaths/min, Blood Pressure: 129/72 mmHg. Respiratory normal breathing without difficulty. Psychiatric this patient is able to make decisions and demonstrates good insight into disease process. Alert and Oriented x 3.  pleasant and cooperative. General  Notes: Patient's wound actually appears to be completely healed which is great news.. Integumentary (Hair, Skin) Wound #1 status is Healed - Epithelialized. Original cause of wound was Gradually Appeared. The date acquired was: 08/23/2021. The wound has been in treatment 7 weeks. The wound is located on the Left,Medial Ankle. The wound measures 0cm length x 0cm width x 0cm depth; 0cm^2 area and 0cm^3 volume. There is no tunneling or undermining noted. There is a none present amount of drainage noted. There is no granulation within the wound bed. There is no necrotic tissue within the wound bed. Assessment Active Problems ICD-10 Chronic venous hypertension (idiopathic) with ulcer and inflammation of left lower extremity Non-pressure chronic ulcer of left ankle with other specified severity Atherosclerosis of native arteries of extremities with intermittent claudication, left leg Plan Discharge From [facility] Services: Discharge from [place] [facility] Complete - apply lotion daily 1. Would recommend currently that we go ahead and discontinue wound care services as the patient does appear to be completely healed this is excellent news. 2. I would recommend that he apply lotion daily in order to prevent any skin breakdown also think that he needs to monitor for any signs of aggressive swelling because this could cause this to reopen to that end he should elevate his legs much as possible wear compression socks if he has any signs of swelling starting to ensue. He voiced understanding. We will see him back for follow-up visit as needed. Electronic Signature(s) Signed: 11/25/2021 6:03:30 PM By: Worthy Keeler PA-C Entered By: Worthy Keeler on 11/25/2021 18:03:30 [name], [name] ([id number]) -------------------------------------------------------------------------------- SuperBill Details Patient Name: [name], [name] Date of Service: 11/23/2021 Medical Record Number: [id number] Patient  Account Number: [id number] Date of Birth/Sex: 1940-12-22 (81 y.o. M) Treating RN: Carlene Coria Primary Care Provider: Owens Loffler Other Clinician: Referring Provider: Owens Loffler Treating Provider/Extender: Skipper Cliche in Treatment: 7 Diagnosis Coding ICD-10 Codes Code Description [phone] Chronic venous hypertension (idiopathic) with ulcer and inflammation of left lower extremity L97.328 Non-pressure chronic ulcer of left ankle with other specified severity I70.212 Atherosclerosis of native arteries of extremities with intermittent claudication, left leg Facility Procedures CPT4 Code: [id number] Description: [phone] - WOUND CARE VISIT-LEV 2 EST PT Modifier: Quantity: 1 Physician Procedures CPT4 Code Description: [id number] - WC PHYS LEVEL 3 - EST PT Modifier: Quantity: 1 CPT4 Code Description: ICD-10 Diagnosis Description I87.332 Chronic venous hypertension (idiopathic) with ulcer and inflammation of left L97.328 Non-pressure chronic ulcer of left ankle with other specified severity I70.212 Atherosclerosis of native  arteries of extremities with intermittent claudica Modifier: lower extremity tion, left leg Quantity: Electronic Signature(s) Signed: 11/25/2021 6:04:07 PM By: Worthy Keeler PA-C Previous Signature: 11/23/2021 4:03:12 PM Version By: Carlene Coria RN Entered By: Worthy Keeler on 11/25/2021 18:04:06

## 2021-11-30 ENCOUNTER — Ambulatory Visit: Payer: Medicare Other | Admitting: Physician Assistant

## 2021-11-30 DIAGNOSIS — I739 Peripheral vascular disease, unspecified: Secondary | ICD-10-CM | POA: Diagnosis not present

## 2021-11-30 DIAGNOSIS — B351 Tinea unguium: Secondary | ICD-10-CM | POA: Diagnosis not present

## 2021-12-02 ENCOUNTER — Ambulatory Visit (INDEPENDENT_AMBULATORY_CARE_PROVIDER_SITE_OTHER): Payer: Medicare Other

## 2021-12-02 DIAGNOSIS — Z7901 Long term (current) use of anticoagulants: Secondary | ICD-10-CM

## 2021-12-02 LAB — POCT INR: INR: 1.6 — AB (ref 2.0–3.0)

## 2021-12-02 NOTE — Patient Instructions (Addendum)
Pre visit review using our clinic review tool, if applicable. No additional management support is needed unless otherwise documented below in the visit note.   Increase dose today and tomorrow to take 1 tablet and then continue 1/2 tablet daily except take 1 tablet on Sundays and Wednesdays.  Recheck in 2 week.

## 2021-12-02 NOTE — Progress Notes (Signed)
Pt has been started on doxycycline, 100 mg,x 8 weeks for dermis staph infection. Pt in today to monitor for interactions. . Pt in today to monitor for interactions. Pt started doxycycline on 6/27. Pt missed dose 5 days ago.   Increase dose today and tomorrow to take 1 tablet and then continue 1/2 tablet daily except take 1 tablet on Sundays and Wednesdays.  Recheck in 2 week.

## 2021-12-03 ENCOUNTER — Encounter: Payer: Medicare Other | Admitting: Physician Assistant

## 2021-12-03 ENCOUNTER — Ambulatory Visit: Payer: Medicare Other | Admitting: Cardiovascular Disease

## 2021-12-03 DIAGNOSIS — I130 Hypertensive heart and chronic kidney disease with heart failure and stage 1 through stage 4 chronic kidney disease, or unspecified chronic kidney disease: Secondary | ICD-10-CM | POA: Diagnosis not present

## 2021-12-03 DIAGNOSIS — I5032 Chronic diastolic (congestive) heart failure: Secondary | ICD-10-CM | POA: Diagnosis not present

## 2021-12-03 DIAGNOSIS — S51812A Laceration without foreign body of left forearm, initial encounter: Secondary | ICD-10-CM | POA: Diagnosis not present

## 2021-12-03 DIAGNOSIS — L97328 Non-pressure chronic ulcer of left ankle with other specified severity: Secondary | ICD-10-CM | POA: Diagnosis not present

## 2021-12-03 DIAGNOSIS — I87332 Chronic venous hypertension (idiopathic) with ulcer and inflammation of left lower extremity: Secondary | ICD-10-CM | POA: Diagnosis not present

## 2021-12-03 DIAGNOSIS — I70212 Atherosclerosis of native arteries of extremities with intermittent claudication, left leg: Secondary | ICD-10-CM | POA: Diagnosis not present

## 2021-12-03 NOTE — Progress Notes (Addendum)
[name] ([id number]) Visit Report for 12/03/2021 Chief Complaint Document Details Patient Name: CERTAIN, [name] Date of Service: 12/03/2021 3:00 PM Medical Record Number: [id number] Patient Account Number: [id number] Date of Birth/Sex: 03-15-1941 (81 y.o. M) Treating RN: Carlene Coria Primary Care Provider: Owens Loffler Other Clinician: Massie Kluver Referring Provider: Owens Loffler Treating Provider/Extender: Skipper Cliche in Treatment: 8 Information Obtained from: Patient Chief Complaint Right forearm laceration Electronic Signature(s) Signed: 12/03/2021 3:41:20 PM By: Worthy Keeler PA-C Previous Signature: 12/03/2021 3:20:40 PM Version By: Worthy Keeler PA-C Entered By: Worthy Keeler on 12/03/2021 15:41:20 [name], [name] ([id number]) -------------------------------------------------------------------------------- Debridement Details Patient Name: [name], [name] Date of Service: 12/03/2021 3:00 PM Medical Record Number: [id number] Patient Account Number: [id number] Date of Birth/Sex: 12/28/1940 (81 y.o. M) Treating RN: Levora Dredge Primary Care Provider: Owens Loffler Other Clinician: Massie Kluver Referring Provider: Owens Loffler Treating Provider/Extender: Skipper Cliche in Treatment: 8 Debridement Performed for Wound #2 Left,Proximal Forearm Assessment: Performed By: Physician Tommie Sams., PA-C Debridement Type: Debridement Level of Consciousness (Pre- Awake and Alert procedure): Pre-procedure Verification/Time Out Yes - 15:43 Taken: Pain Control: Lidocaine 4% Topical Solution Total Area Debrided (L x W): 0.8 (cm) x 2.4 (cm) = 1.92 (cm) Tissue and other material Non-Viable, Skin: Dermis , Skin: Epidermis, Other: foreign body debrided: Level: Skin/Epidermis Debridement Description: Selective/Open Wound Instrument: Forceps, Scissors Bleeding: Moderate Hemostasis Achieved: Pressure Response to Treatment: Procedure was  tolerated well Level of Consciousness (Post- Awake and Alert procedure): Post Debridement Measurements of Total Wound Length: (cm) 0.8 Width: (cm) 2.4 Depth: (cm) 0.2 Volume: (cm) 0.302 Character of Wound/Ulcer Post Debridement: Stable Post Procedure Diagnosis Same as Pre-procedure Electronic Signature(s) Signed: 12/03/2021 4:18:40 PM By: Levora Dredge Signed: 12/03/2021 5:04:29 PM By: Worthy Keeler PA-C Entered By: Levora Dredge on 12/03/2021 15:45:42 [name], [name] ([id number]) -------------------------------------------------------------------------------- HPI Details Patient Name: [name], [name] Date of Service: 12/03/2021 3:00 PM Medical Record Number: [id number] Patient Account Number: [id number] Date of Birth/Sex: 05-04-1940 (81 y.o. M) Treating RN: Carlene Coria Primary Care Provider: Owens Loffler Other Clinician: Massie Kluver Referring Provider: Owens Loffler Treating Provider/Extender: Skipper Cliche in Treatment: 8 History of Present Illness HPI Description: ADMISSION 10/05/2021 This is an 81 year old man who comes to see [place] for a wound on his left medial ankle that is been there for about a month. He has not been dressing this specifically. The patient is known to have both chronic venous insufficiency and severe arterial insufficiency. For the latter he follows with Dr. Fletcher Anon. He has had extensive stenting including bilateral common iliac arteries, left renal artery and stenting in the left upper extremity. He has a remote smoking history and a lot of medical comorbidities but he is not a diabetic. He tells me that the wound on his left medial ankle started about a month ago or he noticed it about a month ago there was no trauma no bleeding no infection that he is aware of not able to tell me exactly how this would have started. Finds it to be quite painful Past medical history is really quite extensive and includes ischemic cardiomyopathy, COPD,  peripheral arterial disease, renal artery stenosis, history of a DVT, chronic kidney disease stage IIIb, hypertension, chronic lower extremity edema, diastolic heart failure. His last arteriogram was in January of this year. He was not felt to have any good endovascular options and he is not felt to be a good surgical candidate. He takes cilostazol for the intermittent claudication. Last noninvasive  studies were in December 2022 at which time his ABI in the right was 0.61 with dampened monophasic wounds. They could not obtain a great toe pressure on the left ABI at 0.56 with a TBI of 0.23 dampened monophasic waveforms 10-12-2021 upon evaluation today patient appears to be doing well with regard to his wounds. He was seen by Dr. Dellia Nims last week. The good news is he is actually showing signs of improvement compared to last week and overall very pleased in that regard. Fortunately there does not appear to be any signs of active infection locally or systemically which is great news. No fevers, chills, nausea, vomiting, or diarrhea. 10-19-2021 upon evaluation today patient appears to be doing well currently in regard to his wound all things considered he still is having quite significant discomfort but again I do believe that he is showing some signs of improvement unfortunately he is also having an issue with a significant rash which is yet to be fully diagnosed I believe. To be honest looking at it today and discussing the way things are going I really feel like he may have some psoriasis believe the palm involvement which is generally not something we see with just eczema or atopic dermatitis. Nonetheless all this was discussed with the patient out the details of this and the plan as well. Either way I do believe that the patient is going to require further dermatologic follow-up and treatment and he actually has an appointment with a second opinion dermatologist tomorrow in [place]. 10-25-2021 upon  evaluation patient appears to be doing well with regard to his wound. This actually showed signs of significant improvement which is great news. He did go see the dermatologist last week they feel that his issue probably is a staph infection. They did a swab we do not know if this is MRSA or just general staph at this point. He was placed on doxycycline however and is doing significantly better. There is no signs of active infection locally or systemically at this time. 11-02-2021 upon evaluation today patient appears to be doing well with regard to his wound. He has been tolerating the dressing changes without complication. Fortunately I think we are on the right track here. There does not appear to be any evidence of active infection locally or systemically at this time. 11-09-2021 upon evaluation today patient appears to be doing well currently in regard to his wound. Unfortunately his breathing is not very good at all. He has significant wheezing and to be honest he has quite a bit of swelling in the lower extremities as well. My question is whether or not this may indeed be a congestive heart failure exacerbation or some other respiratory issue nonetheless his oxygen saturation is running a little bit lower than normal and he seems to be more labored in his breathing compared to normal. I did voice this to him today and his wife was present during the conversation and we discussed options that would be detailed in the plan. 7/25; small wound on the left medial ankle minimal depth. He is using gentamicin Hydrofera Blue and Tubigrip to control swelling. The patient has severe venous hypertension and also significant PAD which is not amenable to any revascularization 11-23-2021 upon evaluation today patient appears to actually be doing extremely well in fact I think he is healed. Fortunately there does not appear to be any evidence of active infection locally or systemically which is great news and  overall I am extremely pleased with where  things stand he is not having any pain. Readmission: 12-03-2021 upon evaluation today patient appears for a different issue we were taking care of his ankle unfortunately he had a fall today and ended up with a laceration of his left forearm. He does have an area where this got punctured the good news is it does not appear to be bleeding too badly the unfortunate news is this actually did split the skin and tore some of the skin away so there is really not anything to approximate which is what I was really hoping to do. Electronic Signature(s) Signed: 12/03/2021 4:40:55 PM By: Worthy Keeler PA-C Entered By: Worthy Keeler on 12/03/2021 16:40:55 [place], [name] ([id number]) [name], [name] ([id number]) -------------------------------------------------------------------------------- Physical Exam Details Patient Name: [name], [name] Date of Service: 12/03/2021 3:00 PM Medical Record Number: [id number] Patient Account Number: [id number] Date of Birth/Sex: [birth year] (81 y.o. M) Treating RN: Carlene Coria Primary Care Provider: Owens Loffler Other Clinician: Massie Kluver Referring Provider: Owens Loffler Treating Provider/Extender: Skipper Cliche in Treatment: 8 Constitutional Well-nourished and well-hydrated in no acute distress. Respiratory normal breathing without difficulty. Psychiatric this patient is able to make decisions and demonstrates good insight into disease process. Alert and Oriented x 3. pleasant and cooperative. Notes Upon inspection patient's wound actually appears to be somewhat gaseous open there is a little bit deeper section in the middle where it does go down into the fat layer but the good news is it does not appear to be infected at this point and again it is a fresh wound we did clean it well with Anasept so hopefully this should help kill bacteria. He is actually on antibiotics, doxycycline, right now and  hopefully this should prevent any infection as well. Electronic Signature(s) Signed: 12/03/2021 4:41:24 PM By: Worthy Keeler PA-C Entered By: Worthy Keeler on 12/03/2021 16:41:24 Sperry, [name] ([id number]) -------------------------------------------------------------------------------- Physician Orders Details Patient Name: [name], [name] Date of Service: 12/03/2021 3:00 PM Medical Record Number: [id number] Patient Account Number: [id number] Date of Birth/Sex: March 21, 1941 (81 y.o. M) Treating RN: Levora Dredge Primary Care Provider: Owens Loffler Other Clinician: Massie Kluver Referring Provider: Owens Loffler Treating Provider/Extender: Skipper Cliche in Treatment: 8 Verbal / Phone Orders: No Diagnosis Coding ICD-10 Coding Code Description [phone] Laceration without foreign body of left forearm, initial encounter I87.332 Chronic venous hypertension (idiopathic) with ulcer and inflammation of left lower extremity I70.212 Atherosclerosis of native arteries of extremities with intermittent claudication, left leg Follow-up Appointments o Return Appointment in 1 week. Bathing/ Shower/ Hygiene o Wash wounds with antibacterial soap and water. - dial soap recommended o May shower; gently cleanse wound with antibacterial soap, rinse and pat dry prior to dressing wounds o No tub bath. Wound Treatment Wound #2 - Forearm Wound Laterality: Left, Proximal Cleanser: [employer] Ancillary Kit - 15 Day Supply (DME) (Generic) 1 x Per Day/30 Days Discharge Instructions: Use supplies as instructed; Kit contains: (15) Saline Bullets; (15) 3x3 Gauze; 15 pr Gloves Cleanser: Soap and Water 1 x Per Day/30 Days Discharge Instructions: Gently cleanse wound with antibacterial soap, rinse and pat dry prior to dressing wounds Primary Dressing: Xeroform-HBD 2x2 (in/in) (DME) (Generic) 1 x Per Day/30 Days Discharge Instructions: Apply Xeroform-HBD 2x2 (in/in) as directed Secondary Dressing:  ABD Pad 5x9 (in/in) (DME) (Generic) 1 x Per Day/30 Days Discharge Instructions: Cover with ABD pad Secondary Dressing: Kerlix 4.5 x 4.1 (in/yd) (DME) (Generic) 1 x Per Day/30 Days Discharge Instructions: Apply Kerlix 4.5 x 4.1 (in/yd) as instructed  Secured With: [employer], Latex-free, Size 5, Small-Head / Shoulder / Thigh (DME) (Generic) 1 x Per Day/30 Days Electronic Signature(s) Signed: 12/03/2021 4:18:40 PM By: Levora Dredge Signed: 12/03/2021 5:04:29 PM By: Worthy Keeler PA-C Entered By: Levora Dredge on 12/03/2021 15:57:42 [place], [name] ([id number]) -------------------------------------------------------------------------------- Problem List Details Patient Name: Harn, Jahshua T. Date of Service: 12/03/2021 3:00 PM Medical Record Number: [id number] Patient Account Number: [id number] Date of Birth/Sex: 1940-09-22 (81 y.o. M) Treating RN: Carlene Coria Primary Care Provider: Owens Loffler Other Clinician: Massie Kluver Referring Provider: Owens Loffler Treating Provider/Extender: Skipper Cliche in Treatment: 8 Active Problems ICD-10 Encounter Code Description Active Date MDM Diagnosis S51.812A Laceration without foreign body of left forearm, initial encounter 12/03/2021 No Yes I87.332 Chronic venous hypertension (idiopathic) with ulcer and inflammation of 10/05/2021 No Yes left lower extremity I70.212 Atherosclerosis of native arteries of extremities with intermittent 10/05/2021 No Yes claudication, left leg Inactive Problems Resolved Problems ICD-10 Code Description Active Date Resolved Date L97.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  ([id number]) -------------------------------------------------------------------------------- Progress Note Details Patient Name: Kibbe, [name] Date of Service: 12/03/2021 3:00 PM Medical Record Number: [id number] Patient Account Number: [id number] Date of Birth/Sex: December 10, 1940 (81 y.o. M) Treating RN: Carlene Coria Primary Care Provider: Owens Loffler Other Clinician: Massie Kluver Referring Provider: Owens Loffler Treating Provider/Extender: Skipper Cliche in Treatment: 8 Subjective Chief Complaint Information obtained from Patient Right forearm laceration History of Present Illness (HPI) ADMISSION 10/05/2021 This is an 81 year old man who comes to see [place] for a wound on his left medial ankle that is been there for about a month. He has not been dressing this specifically. The patient is known to have both chronic venous insufficiency and severe arterial insufficiency. For the latter he follows with Dr. Fletcher Anon. He has had extensive stenting including bilateral common iliac arteries, left renal artery and stenting in the left upper extremity. He has a remote smoking history and a lot of medical comorbidities but he is not a diabetic. He tells me that the wound on his left medial ankle started about a month ago or he noticed it about a month ago there was no trauma no bleeding no infection that he is aware of not able to tell me exactly how this would have started. Finds it to be quite painful Past medical history is really quite extensive and includes ischemic cardiomyopathy, COPD, peripheral arterial disease, renal artery stenosis, history of a DVT, chronic kidney disease stage IIIb, hypertension, chronic lower extremity edema, diastolic heart failure. His last arteriogram was in January of this year. He was not felt to have any good endovascular options and he is not felt to be a good surgical candidate. He takes cilostazol for the intermittent claudication. Last noninvasive  studies were in December 2022 at which time his ABI in the right was 0.61 with dampened monophasic wounds. They could not obtain a great toe pressure on the left ABI at 0.56 with a TBI of 0.23 dampened monophasic waveforms 10-12-2021 upon evaluation today patient appears to be doing well with regard to his wounds. He was seen by Dr. Dellia Nims last week. The good news is he is actually showing signs of improvement compared to last week and overall very pleased in that regard. Fortunately there does  not appear to be any signs of active infection locally or systemically which is great news. No fevers, chills, nausea, vomiting, or diarrhea. 10-19-2021 upon evaluation today patient appears to be doing well currently in regard to his wound all things considered he still is having quite significant discomfort but again I do believe that he is showing some signs of improvement unfortunately he is also having an issue with a significant rash which is yet to be fully diagnosed I believe. To be honest looking at it today and discussing the way things are going I really feel like he may have some psoriasis believe the palm involvement which is generally not something we see with just eczema or atopic dermatitis. Nonetheless all this was discussed with the patient out the details of this and the plan as well. Either way I do believe that the patient is going to require further dermatologic follow-up and treatment and he actually has an appointment with a second opinion dermatologist tomorrow in [place]. 10-25-2021 upon evaluation patient appears to be doing well with regard to his wound. This actually showed signs of significant improvement which is great news. He did go see the dermatologist last week they feel that his issue probably is a staph infection. They did a swab we do not know if this is MRSA or just general staph at this point. He was placed on doxycycline however and is doing significantly better. There  is no signs of active infection locally or systemically at this time. 11-02-2021 upon evaluation today patient appears to be doing well with regard to his wound. He has been tolerating the dressing changes without complication. Fortunately I think we are on the right track here. There does not appear to be any evidence of active infection locally or systemically at this time. 11-09-2021 upon evaluation today patient appears to be doing well currently in regard to his wound. Unfortunately his breathing is not very good at all. He has significant wheezing and to be honest he has quite a bit of swelling in the lower extremities as well. My question is whether or not this may indeed be a congestive heart failure exacerbation or some other respiratory issue nonetheless his oxygen saturation is running a little bit lower than normal and he seems to be more labored in his breathing compared to normal. I did voice this to him today and his wife was present during the conversation and we discussed options that would be detailed in the plan. 7/25; small wound on the left medial ankle minimal depth. He is using gentamicin Hydrofera Blue and Tubigrip to control swelling. The patient has severe venous hypertension and also significant PAD which is not amenable to any revascularization 11-23-2021 upon evaluation today patient appears to actually be doing extremely well in fact I think he is healed. Fortunately there does not appear to be any evidence of active infection locally or systemically which is great news and overall I am extremely pleased with where things stand he is not having any pain. Readmission: 12-03-2021 upon evaluation today patient appears for a different issue we were taking care of his ankle unfortunately he had a fall today and ended up with a laceration of his left forearm. He does have an area where this got punctured the good news is it does not appear to be bleeding too badly the unfortunate  news is this actually did split the skin and tore some of the skin away so there is really not anything to approximate  which is what I was really hoping to do. Shiffler, [name] TMarland Kitchen ([id number]) Objective Constitutional Well-nourished and well-hydrated in no acute distress. Vitals Time Taken: 3:22 PM, Height: 69 in, Weight: 174 lbs, BMI: 25.7, Temperature: 97.9 F, Pulse: 88 bpm, Respiratory Rate: 18 breaths/min, Blood Pressure: 145/98 mmHg. Respiratory normal breathing without difficulty. Psychiatric this patient is able to make decisions and demonstrates good insight into disease process. Alert and Oriented x 3. pleasant and cooperative. General Notes: Upon inspection patient's wound actually appears to be somewhat gaseous open there is a little bit deeper section in the middle where it does go down into the fat layer but the good news is it does not appear to be infected at this point and again it is a fresh wound we did clean it well with Anasept so hopefully this should help kill bacteria. He is actually on antibiotics, doxycycline, right now and hopefully this should prevent any infection as well. Integumentary (Hair, Skin) Wound #2 status is Open. Original cause of wound was Trauma. The date acquired was: 12/03/2021. The wound is located on the Left,Proximal Forearm. The wound measures 0.8cm length x 2.4cm width x 0.2cm depth; 1.508cm^2 area and 0.302cm^3 volume. There is Fat Layer (Subcutaneous Tissue) exposed. There is no tunneling or undermining noted. There is a medium amount of serosanguineous drainage noted. There is large (67-100%) red, pink granulation within the wound bed. There is a small (1-33%) amount of necrotic tissue within the wound bed. Assessment Active Problems ICD-10 Laceration without foreign body of left forearm, initial encounter Chronic venous hypertension (idiopathic) with ulcer and inflammation of left lower extremity Atherosclerosis of native arteries of  extremities with intermittent claudication, left leg Procedures Wound #2 Pre-procedure diagnosis of Wound #2 is a Trauma, Other located on the Left,Proximal Forearm . There was a Selective/Open Wound Skin/Epidermis Debridement with a total area of 1.92 sq cm performed by Tommie Sams., PA-C. With the following instrument(s): Forceps, and Scissors to remove Non-Viable tissue/material. Material removed includes Skin: Dermis, Skin: Epidermis, and Other: foreign body after achieving pain control using Lidocaine 4% Topical Solution. No specimens were taken. A time out was conducted at 15:43, prior to the start of the procedure. A Moderate amount of bleeding was controlled with Pressure. The procedure was tolerated well. Post Debridement Measurements: 0.8cm length x 2.4cm width x 0.2cm depth; 0.302cm^3 volume. Character of Wound/Ulcer Post Debridement is stable. Post procedure Diagnosis Wound #2: Same as Pre-Procedure Plan Follow-up Appointments: Return Appointment in 1 week. Bathing/ Shower/ Hygiene: Ivan, [name] TMarland Kitchen ([id number]) Wash wounds with antibacterial soap and water. - dial soap recommended May shower; gently cleanse wound with antibacterial soap, rinse and pat dry prior to dressing wounds No tub bath. WOUND #2: - Forearm Wound Laterality: Left, Proximal Cleanser: [employer] Ancillary Kit - 15 Day Supply (DME) (Generic) 1 x Per Day/30 Days Discharge Instructions: Use supplies as instructed; Kit contains: (15) Saline Bullets; (15) 3x3 Gauze; 15 pr Gloves Cleanser: Soap and Water 1 x Per Day/30 Days Discharge Instructions: Gently cleanse wound with antibacterial soap, rinse and pat dry prior to dressing wounds Primary Dressing: Xeroform-HBD 2x2 (in/in) (DME) (Generic) 1 x Per Day/30 Days Discharge Instructions: Apply Xeroform-HBD 2x2 (in/in) as directed Secondary Dressing: ABD Pad 5x9 (in/in) (DME) (Generic) 1 x Per Day/30 Days Discharge Instructions: Cover with ABD pad Secondary Dressing:  Kerlix 4.5 x 4.1 (in/yd) (DME) (Generic) 1 x Per Day/30 Days Discharge Instructions: Apply Kerlix 4.5 x 4.1 (in/yd) as instructed Secured With: [employer] Dressing, Latex-free, Size  5, Small-Head / Shoulder / Thigh (DME) (Generic) 1 x Per Day/30 Days 1. I would recommend that we go ahead and initiate treatment with a Xeroform gauze dressing which I think is probably going to be the best way to go and the patient is in agreement with that plan. 2. I am also can recommend that we have the patient continue with an ABD pad to cover followed by roll gauze to secure in place and stretch net. 3. I am going to recommend that he continue to monitor for any signs of infection I would recommend he change this daily hopefully he will not have any further issues. We will see patient back for reevaluation in 1 week here in the clinic. If anything worsens or changes patient will contact our office for additional recommendations. Electronic Signature(s) Signed: 12/03/2021 4:41:56 PM By: Worthy Keeler PA-C Entered By: Worthy Keeler on 12/03/2021 16:41:55 Murton, Hailey T. ([id number]) -------------------------------------------------------------------------------- SuperBill Details Patient Name: [name], Melinda T. Date of Service: 12/03/2021 Medical Record Number: [id number] Patient Account Number: [id number] Date of Birth/Sex: Apr 17, 1941 (81 y.o. M) Treating RN: Carlene Coria Primary Care Provider: Owens Loffler Other Clinician: Massie Kluver Referring Provider: Owens Loffler Treating Provider/Extender: Skipper Cliche in Treatment: 8 Diagnosis Coding ICD-10 Codes Code Description [phone] Laceration without foreign body of left forearm, initial encounter I87.332 Chronic venous hypertension (idiopathic) with ulcer and inflammation of left lower extremity I70.212 Atherosclerosis of native arteries of extremities with intermittent claudication, left leg Facility Procedures CPT4 Code:  72536644 Description: 97597 - DEBRIDE WOUND 1ST 20 SQ CM OR < Modifier: Quantity: 1 CPT4 Code: Description: ICD-10 Diagnosis Description S51.812A Laceration without foreign body of left forearm, initial encounter Modifier: Quantity: Physician Procedures CPT4 Code Description: [id number] 95638 - WC PHYS LEVEL 3 - EST PT Modifier: 25 Quantity: 1 CPT4 Code Description: ICD-10 Diagnosis Description S51.812A Laceration without foreign body of left forearm, initial encounter I87.332 Chronic venous hypertension (idiopathic) with ulcer and inflammation of left I70.212 Atherosclerosis of native  arteries of extremities with intermittent claudica Modifier: lower extremity tion, left leg Quantity: CPT4 Code Description: [id number] 95188 - WC PHYS DEBR WO ANESTH 20 SQ CM Modifier: Quantity: 1 CPT4 Code Description: ICD-10 Diagnosis Description C16.606T Laceration without foreign body of left forearm, initial encounter Modifier: Quantity: Electronic Signature(s) Signed: 12/03/2021 4:42:23 PM By: Worthy Keeler PA-C Entered By: Worthy Keeler on 12/03/2021 16:42:22

## 2021-12-03 NOTE — Progress Notes (Addendum)
[name] ([id number]) Visit Report for 12/03/2021 Arrival Information Details Patient Name: [name]. Date of Service: 12/03/2021 3:00 PM Medical Record Number: [id number] Patient Account Number: [id number] Date of Birth/Sex: 21-Dec-1940 (81 y.o. M) Treating RN: [name] Primary Care [name]: [name] Other Clinician: Massie [name] Referring [name]: [name] Treating Anaalicia Reimann/Extender: [name] in Treatment: 8 Visit Information History Since Last Visit Added or deleted any medications: No Patient Arrived: Ambulatory Any new allergies or adverse reactions: No Arrival Time: 15:21 Had a fall or experienced change in Yes Accompanied By: wife activities of daily living that may affect Transfer Assistance: None risk of falls: Patient Identification Verified: Yes Hospitalized since last visit: No Secondary Verification Process Completed: Yes Has Dressing in Place as Prescribed: Yes Patient Requires Transmission-Based No Pain Present Now: Yes Precautions: Patient Has Alerts: Yes Patient Alerts: Patient on Blood Thinner L ABI .56 04/22/21 R ABI .61 04/22/21 Electronic Signature(s) Signed: 12/03/2021 4:18:40 PM By: [name] Entered By: [name] on 12/03/2021 15:22:34 [name], [name]. ([id number]) -------------------------------------------------------------------------------- Clinic Level of Care Assessment Details Patient Name: [name]. Date of Service: 12/03/2021 3:00 PM Medical Record Number: [id number] Patient Account Number: [id number] Date of Birth/Sex: [birth year] (81 y.o. M) Treating RN: [name] Primary Care [name]: [name] Other Clinician: Massie [name] Referring [name]: [name] Treating Nasrin Lanzo/Extender: [name] in Treatment: 8 Clinic Level of Care Assessment Items TOOL 1 Quantity Score []  - Use when EandM and Procedure is performed on INITIAL visit 0 ASSESSMENTS  - Nursing Assessment / Reassessment []  - General Physical Exam (combine w/ comprehensive assessment (listed just below) when performed on new 0 pt. evals) []  - 0 Comprehensive Assessment (HX, ROS, Risk Assessments, Wounds Hx, etc.) ASSESSMENTS - Wound and Skin Assessment / Reassessment []  - Dermatologic / Skin Assessment (not related to wound area) 0 ASSESSMENTS - Ostomy and/or Continence Assessment and Care []  - Incontinence Assessment and Management 0 []  - 0 Ostomy Care Assessment and Management (repouching, etc.) PROCESS - Coordination of Care []  - Simple Patient / Family Education for ongoing care 0 []  - 0 Complex (extensive) Patient / Family Education for ongoing care []  - 0 Staff obtains Programmer, systems, Records, Test Results / Process Orders []  - 0 Staff telephones HHA, Nursing Homes / Clarify orders / etc []  - 0 Routine Transfer to another Facility (non-emergent condition) []  - 0 Routine Hospital Admission (non-emergent condition) []  - 0 New Admissions / Biomedical engineer / Ordering NPWT, Apligraf, etc. []  - 0 Emergency Hospital Admission (emergent condition) PROCESS - Special Needs []  - Pediatric / Minor Patient Management 0 []  - 0 Isolation Patient Management []  - 0 Hearing / Language / Visual special needs []  - 0 Assessment of Community assistance (transportation, D/C planning, etc.) []  - 0 Additional assistance / Altered mentation []  - 0 Support Surface(s) Assessment (bed, cushion, seat, etc.) INTERVENTIONS - Miscellaneous []  - External ear exam 0 []  - 0 Patient Transfer (multiple staff / Civil Service fast streamer / Similar devices) []  - 0 Simple Staple / Suture removal (25 or less) []  - 0 Complex Staple / Suture removal (26 or more) []  - 0 Hypo/Hyperglycemic Management (do not check if billed separately) []  - 0 Ankle / Brachial Index (ABI) - do not check if billed separately Has the patient been seen at the hospital within the last three years: Yes Total Score:  0 Level Of Care: ____ [name] ([id number]) Electronic Signature(s) Signed: 12/03/2021 4:18:40 PM By: [name] Entered By: [name],  [name] on 12/03/2021 15:57:51 [name]. ([id number]) -------------------------------------------------------------------------------- Encounter Discharge Information Details Patient Name: [name]. Date of Service: 12/03/2021 3:00 PM Medical Record Number: [id number] Patient Account Number: [id number] Date of Birth/Sex: January 23, 1941 (81 y.o. M) Treating RN: [name] Primary Care Patrisha Hausmann: [name] Other Clinician: Massie [name] Referring [name]: [name] Treating [name]/Extender: [name] in Treatment: 8 Encounter Discharge Information Items Post Procedure Vitals Discharge Condition: Stable Temperature (F): 97.9 Ambulatory Status: Ambulatory Pulse (bpm): 88 Discharge Destination: Home Respiratory Rate (breaths/min): 18 Transportation: Private Auto Blood Pressure (mmHg): 145/98 Accompanied By: [name] Schedule Follow-up Appointment: Yes Clinical Summary of Care: Electronic Signature(s) Signed: 12/03/2021 4:18:40 PM By: [name] Entered By: [name] on 12/03/2021 15:58:51 [name]. ([id number]) -------------------------------------------------------------------------------- Lower Extremity Assessment Details Patient Name: [name]. Date of Service: 12/03/2021 3:00 PM Medical Record Number: [id number] Patient Account Number: [id number] Date of Birth/Sex: 11-02-1940 (81 y.o. M) Treating RN: [name] Primary Care Anieya Helman: [name] Other Clinician: Massie [name] Referring Maricus Tanzi: [name] Treating [name]/Extender: [name] in Treatment: 8 Electronic Signature(s) Signed: 12/03/2021 4:18:40 PM By: [name] Entered By: [name] on 12/03/2021 15:30:05 [name].  ([id number]) -------------------------------------------------------------------------------- Multi Wound Chart Details Patient Name: [name]. Date of Service: 12/03/2021 3:00 PM Medical Record Number: [id number] Patient Account Number: [id number] Date of Birth/Sex: 05-15-1940 (81 y.o. M) Treating RN: [name] Primary Care Colby Catanese: [name] Other Clinician: Massie [name] Referring Casimer Russett: [name] Treating Jonta Gastineau/Extender: [name] in Treatment: 8 Vital Signs Height(in): 69 Pulse(bpm): 88 Weight(lbs): 174 Blood Pressure(mmHg): 145/98 Body Mass Index(BMI): 25.7 Temperature(F): 97.9 Respiratory Rate(breaths/min): 18 Photos: [N/A:N/A] Wound Location: Left, Proximal Forearm N/A N/A Wounding Event: Trauma N/A N/A Primary Etiology: Trauma, Other N/A N/A Comorbid History: Coronary Artery Disease, Deep Vein N/A N/A Thrombosis, Hypertension, Peripheral Arterial Disease Date Acquired: 12/03/2021 N/A N/A Weeks of Treatment: 0 N/A N/A Wound Status: Open N/A N/A Wound Recurrence: No N/A N/A Measurements L x W x D (cm) 0.8x2.4x0.2 N/A N/A Area (cm) : 1.508 N/A N/A Volume (cm) : 0.302 N/A N/A Classification: Full Thickness Without Exposed N/A N/A Support Structures Exudate Amount: Medium N/A N/A Exudate Type: Serosanguineous N/A N/A Exudate Color: red, brown N/A N/A Granulation Amount: Large (67-100%) N/A N/A Granulation Quality: Red, Pink N/A N/A Necrotic Amount: Small (1-33%) N/A N/A Exposed Structures: Fat Layer (Subcutaneous Tissue): N/A N/A Yes Epithelialization: None N/A N/A Treatment Notes Electronic Signature(s) Signed: 12/03/2021 4:18:40 PM By: [name] Entered By: [name] on 12/03/2021 15:42:25 [name]. ([id number]) -------------------------------------------------------------------------------- Multi-Disciplinary Care Plan Details Patient Name: [name], Keiondre [name]. Date of Service: 12/03/2021 3:00  PM Medical Record Number: [id number] Patient Account Number: [id number] Date of Birth/Sex: 10/03/1940 (81 y.o. M) Treating RN: [name] Primary Care Abelino Tippin: [name] Other Clinician: Massie [name] Referring Nellene Courtois: [name] Treating Cienna Dumais/Extender: [name] in Treatment: 8 Active Inactive Electronic Signature(s) Signed: 12/03/2021 4:18:40 PM By: [name] Entered By: [name] on 12/03/2021 15:42:16 [name]. ([id number]) -------------------------------------------------------------------------------- Pain Assessment Details Patient Name: Mancia, Emori [name]. Date of Service: 12/03/2021 3:00 PM Medical Record Number: [id number] Patient Account Number: [id number] Date of Birth/Sex: 27-Jan-1941 (81 y.o. M) Treating RN: [name] Primary Care Yissel Habermehl: [name] Other Clinician: Massie [name] Referring Kylar Speelman: [name] Treating Luverne Farone/Extender: [name] in Treatment: 8 Active Problems Location of Pain Severity and Description of Pain Patient Has Paino Yes Site Locations Rate the pain. Current Pain Level: 5 Pain Management and Medication Current Pain Management:  Electronic Signature(s) Signed: 12/03/2021 4:18:40 PM By: [name] Entered By: [name] on 12/03/2021 15:23:23 Wiater, Derry TMarland Kitchen ([id number]) -------------------------------------------------------------------------------- Patient/Caregiver Education Details Patient Name: [name], Nadia [name]. Date of Service: 12/03/2021 3:00 PM Medical Record Number: [id number] Patient Account Number: [id number] Date of Birth/Gender: December 16, 1940 (81 y.o. M) Treating RN: [name] Primary Care Physician: [name] Other Clinician: Massie [name] Referring Physician: Owens [name] Treating Physician/Extender: [name] in Treatment: 8 Education Assessment Education Provided To: Patient Education Topics Provided Wound  Debridement: Handouts: Wound Debridement Methods: Explain/Verbal Responses: State content correctly Wound/Skin Impairment: Handouts: Caring for Your Ulcer Methods: Explain/Verbal Responses: State content correctly Electronic Signature(s) Signed: 12/03/2021 4:18:40 PM By: [name] Entered By: [name] on 12/03/2021 15:58:10 York, Vonzell [name]. ([id number]) -------------------------------------------------------------------------------- Wound Assessment Details Patient Name: Grantz, Iosefa [name]. Date of Service: 12/03/2021 3:00 PM Medical Record Number: [id number] Patient Account Number: [id number] Date of Birth/Sex: 07/23/1940 (81 y.o. M) Treating RN: [name] Primary Care Aanshi Batchelder: [name] Other Clinician: Massie [name] Referring Godson Pollan: [name] Treating Digna Countess/Extender: [name] in Treatment: 8 Wound Status Wound Number: 2 Primary Trauma, Other Etiology: Wound Location: Left, Proximal Forearm Wound Open Wounding Event: Trauma Status: Date Acquired: 12/03/2021 Comorbid Coronary Artery Disease, Deep Vein Thrombosis, Weeks Of Treatment: 0 History: Hypertension, Peripheral Arterial Disease Clustered Wound: No Photos Wound Measurements Length: (cm) 0.8 Width: (cm) 2.4 Depth: (cm) 0.2 Area: (cm) 1.508 Volume: (cm) 0.302 % Reduction in Area: % Reduction in Volume: Epithelialization: None Tunneling: No Undermining: No Wound Description Classification: Full Thickness Without Exposed Support Structures Exudate Amount: Medium Exudate Type: Serosanguineous Exudate Color: red, brown Foul Odor After Cleansing: No Slough/Fibrino Yes Wound Bed Granulation Amount: Large (67-100%) Exposed Structure Granulation Quality: Red, Pink Fat Layer (Subcutaneous Tissue) Exposed: Yes Necrotic Amount: Small (1-33%) Treatment Notes Wound #2 (Forearm) Wound Laterality: Left, Proximal Cleanser Byram Ancillary Kit - 15 Day Supply Discharge  Instruction: Use supplies as instructed; Kit contains: (15) Saline Bullets; (15) 3x3 Gauze; 15 pr Gloves Soap and Water Discharge Instruction: Gently cleanse wound with antibacterial soap, rinse and pat dry prior to dressing wounds Peri-Wound Care Topical Radney, Corliss [name]. ([id number]) Primary Dressing Xeroform-HBD 2x2 (in/in) Discharge Instruction: Apply Xeroform-HBD 2x2 (in/in) as directed Secondary Dressing ABD Pad 5x9 (in/in) Discharge Instruction: Cover with ABD pad Kerlix 4.5 x 4.1 (in/yd) Discharge Instruction: Apply Kerlix 4.5 x 4.1 (in/yd) as instructed Secured With Stretch Net Dressing, Latex-free, Size 5, Small-Head / Shoulder / Thigh Compression Wrap Compression Stockings Add-Ons Electronic Signature(s) Signed: 12/03/2021 4:18:40 PM By: [name] Entered By: [name] on 12/03/2021 15:29:55 Iwanicki, Kyran [name]. ([id number]) -------------------------------------------------------------------------------- Vitals Details Patient Name: [name], Chancy [name]. Date of Service: 12/03/2021 3:00 PM Medical Record Number: [id number] Patient Account Number: [id number] Date of Birth/Sex: Jul 23, 1940 (81 y.o. M) Treating RN: [name] Primary Care Emonnie Cannady: [name] Other Clinician: Massie [name] Referring Dhani Dannemiller: [name] Treating Robyn Nohr/Extender: [name] in Treatment: 8 Vital Signs Time Taken: 15:22 Temperature (F): 97.9 Height (in): 69 Pulse (bpm): 88 Weight (lbs): 174 Respiratory Rate (breaths/min): 18 Body Mass Index (BMI): 25.7 Blood Pressure (mmHg): 145/98 Reference Range: 80 - 120 mg / dl Electronic Signature(s) Signed: 12/03/2021 4:18:40 PM By: [name] Entered By: [name] on 12/03/2021 15:22:50

## 2021-12-06 ENCOUNTER — Ambulatory Visit: Payer: Medicare Other | Admitting: Physician Assistant

## 2021-12-07 ENCOUNTER — Ambulatory Visit: Payer: Medicare Other | Admitting: Physician Assistant

## 2021-12-14 ENCOUNTER — Encounter: Payer: Medicare Other | Admitting: Physician Assistant

## 2021-12-14 ENCOUNTER — Ambulatory Visit: Payer: Medicare Other | Admitting: Physician Assistant

## 2021-12-14 DIAGNOSIS — I70212 Atherosclerosis of native arteries of extremities with intermittent claudication, left leg: Secondary | ICD-10-CM | POA: Diagnosis not present

## 2021-12-14 DIAGNOSIS — I130 Hypertensive heart and chronic kidney disease with heart failure and stage 1 through stage 4 chronic kidney disease, or unspecified chronic kidney disease: Secondary | ICD-10-CM | POA: Diagnosis not present

## 2021-12-14 DIAGNOSIS — I5032 Chronic diastolic (congestive) heart failure: Secondary | ICD-10-CM | POA: Diagnosis not present

## 2021-12-14 DIAGNOSIS — I87332 Chronic venous hypertension (idiopathic) with ulcer and inflammation of left lower extremity: Secondary | ICD-10-CM | POA: Diagnosis not present

## 2021-12-14 DIAGNOSIS — L97328 Non-pressure chronic ulcer of left ankle with other specified severity: Secondary | ICD-10-CM | POA: Diagnosis not present

## 2021-12-14 DIAGNOSIS — S51812A Laceration without foreign body of left forearm, initial encounter: Secondary | ICD-10-CM | POA: Diagnosis not present

## 2021-12-14 NOTE — Progress Notes (Signed)
GRIGOR, LIPSCHUTZ ([id number]) Visit Report for 12/14/2021 Chief Complaint Document Details Patient Name: [name], [name]. Date of Service: 12/14/2021 10:15 AM Medical Record Number: [id number] Patient Account Number: [id number] Date of Birth/Sex: Jan 23, 1941 (81 y.o. M) Treating RN: Carlene Coria Primary Care Provider: Owens Loffler Other Clinician: Referring Provider: Owens Loffler Treating Provider/Extender: Skipper Cliche in Treatment: 10 Information Obtained from: Patient Chief Complaint Right forearm laceration Electronic Signature(s) Signed: 12/14/2021 10:23:36 AM By: Worthy Keeler PA-C Entered By: Worthy Keeler on 12/14/2021 10:23:35 [name], [name]. ([id number]) -------------------------------------------------------------------------------- Physician Orders Details Patient Name: [name], [name]. Date of Service: 12/14/2021 10:15 AM Medical Record Number: [id number] Patient Account Number: [id number] Date of Birth/Sex: Jun 30, 1940 (81 y.o. M) Treating RN: Carlene Coria Primary Care Provider: Owens Loffler Other Clinician: Referring Provider: Owens Loffler Treating Provider/Extender: Skipper Cliche in Treatment: 10 Verbal / Phone Orders: No Diagnosis Coding ICD-10 Coding Code Description [phone] Laceration without foreign body of left forearm, initial encounter I87.332 Chronic venous hypertension (idiopathic) with ulcer and inflammation of left lower extremity I70.212 Atherosclerosis of native arteries of extremities with intermittent claudication, left leg Follow-up Appointments o Return Appointment in 1 week. Bathing/ Shower/ Hygiene o Wash wounds with antibacterial soap and water. - dial soap recommended o May shower; gently cleanse wound with antibacterial soap, rinse and pat dry prior to dressing wounds o No tub bath. Anesthetic (Use 'Patient Medications' Section for Anesthetic Order Entry) o Lidocaine applied to wound bed Wound  Treatment Wound #2 - Forearm Wound Laterality: Left, Proximal Cleanser: Byram Ancillary Kit - 15 Day Supply (Generic) 1 x Per Day/30 Days Discharge Instructions: Use supplies as instructed; Kit contains: (15) Saline Bullets; (15) 3x3 Gauze; 15 pr Gloves Cleanser: Soap and Water 1 x Per Day/30 Days Discharge Instructions: Gently cleanse wound with antibacterial soap, rinse and pat dry prior to dressing wounds Primary Dressing: Xeroform-HBD 2x2 (in/in) (Generic) 1 x Per Day/30 Days Discharge Instructions: Apply Xeroform-HBD 2x2 (in/in) as directed Secondary Dressing: ABD Pad 5x9 (in/in) (Generic) 1 x Per Day/30 Days Discharge Instructions: Cover with ABD pad Secondary Dressing: Kerlix 4.5 x 4.1 (in/yd) (Generic) 1 x Per Day/30 Days Discharge Instructions: Apply Kerlix 4.5 x 4.1 (in/yd) as instructed Secured With: Stretch Net Dressing, Latex-free, Size 5, Small-Head / Shoulder / Thigh (Generic) 1 x Per Day/30 Days Electronic Signature(s) Unsigned Entered ByCarlene Coria on 12/14/2021 10:32:53 Signature(s): Date(s): [name], [name] TMarland Kitchen ([id number]) -------------------------------------------------------------------------------- Problem List Details Patient Name: [name], [name]. Date of Service: 12/14/2021 10:15 AM Medical Record Number: [id number] Patient Account Number: [id number] Date of Birth/Sex: [birth year] (81 y.o. M) Treating RN: Carlene Coria Primary Care Provider: Owens Loffler Other Clinician: Referring Provider: Owens Loffler Treating Provider/Extender: Skipper Cliche in Treatment: 10 Active Problems ICD-10 Encounter Code Description Active Date MDM Diagnosis S51.812A Laceration without foreign body of left forearm, initial encounter 12/03/2021 No Yes I87.332 Chronic venous hypertension (idiopathic) with ulcer and inflammation of 10/05/2021 No Yes left lower extremity I70.212 Atherosclerosis of native arteries of extremities with intermittent 10/05/2021 No  Yes claudication, left leg Inactive Problems Resolved Problems ICD-10 Code Description Active Date Resolved Date L97.328 Non-pressure chronic ulcer of left ankle with other specified severity 10/05/2021 10/05/2021 Electronic Signature(s) Signed: 12/14/2021 10:23:32 AM By: Worthy Keeler PA-C Entered By: Worthy Keeler on 12/14/2021 10:23:31 [name], [name]. ([id number]) -------------------------------------------------------------------------------- SuperBill Details Patient Name: [name], [name]. Date of Service: 12/14/2021 Medical Record Number: [id number] Patient Account Number: [id number] Date of Birth/Sex: 1941-01-20 (81 y.o. M) Treating RN: Carlene Coria Primary  Care Provider: Owens Loffler Other Clinician: Referring Provider: Owens Loffler Treating Provider/Extender: Skipper Cliche in Treatment: 10 Diagnosis Coding ICD-10 Codes Code Description [phone] Laceration without foreign body of left forearm, initial encounter I87.332 Chronic venous hypertension (idiopathic) with ulcer and inflammation of left lower extremity I70.212 Atherosclerosis of native arteries of extremities with intermittent claudication, left leg Facility Procedures CPT4 Code: [id number] Description: 23200 - WOUND CARE VISIT-LEV 2 EST PT Modifier: Quantity: 1 Electronic Signature(s) Unsigned Entered ByCarlene Coria on 12/14/2021 10:33:35 Signature(s): Date(s):

## 2021-12-15 ENCOUNTER — Other Ambulatory Visit: Payer: Self-pay | Admitting: Cardiovascular Disease

## 2021-12-15 NOTE — Telephone Encounter (Signed)
Good Morning,   Could you please schedule a 6 month follow up visit? The patient was last seen by Dr. Fletcher Anon on 06-01-21. Thank you so much.

## 2021-12-16 ENCOUNTER — Ambulatory Visit (INDEPENDENT_AMBULATORY_CARE_PROVIDER_SITE_OTHER): Payer: Medicare Other

## 2021-12-16 DIAGNOSIS — Z7901 Long term (current) use of anticoagulants: Secondary | ICD-10-CM | POA: Diagnosis not present

## 2021-12-16 LAB — POCT INR: INR: 2.2 (ref 2.0–3.0)

## 2021-12-16 NOTE — Patient Instructions (Addendum)
Pre visit review using our clinic review tool, if applicable. No additional management support is needed unless otherwise documented below in the visit note.  Continue 1/2 tablet daily except take 1 tablet on Sundays and Wednesdays.  Recheck in 2 week.

## 2021-12-16 NOTE — Progress Notes (Signed)
[name], [name] ([id number]) Visit Report for 12/14/2021 Arrival Information Details Patient Name: [name], [name] Date of Service: 12/14/2021 10:15 AM Medical Record Number: [id number] Patient Account Number: [id number] Date of Birth/Sex: 02/04/1941 (81 y.o. M) Treating RN: Carlene Coria Primary Care Piero Mustard: Owens Loffler Other Clinician: Referring Herby Amick: Owens Loffler Treating Trajon Rosete/Extender: Skipper Cliche in Treatment: 10 Visit Information History Since Last Visit All ordered tests and consults were completed: No Patient Arrived: Ambulatory Added or deleted any medications: No Arrival Time: 10:13 Any new allergies or adverse reactions: No Accompanied By: wife Had a fall or experienced change in No Transfer Assistance: None activities of daily living that may affect Patient Identification Verified: Yes risk of falls: Secondary Verification Process Completed: Yes Signs or symptoms of abuse/neglect since last visito No Patient Requires Transmission-Based No Hospitalized since last visit: No Precautions: Implantable device outside of the clinic excluding No Patient Has Alerts: Yes cellular tissue based products placed in the center Patient Alerts: Patient on Blood since last visit: Thinner Has Dressing in Place as Prescribed: Yes L ABI .56 04/22/21 Pain Present Now: No R ABI .61 04/22/21 Electronic Signature(s) Signed: 12/16/2021 4:19:23 PM By: Carlene Coria RN Entered By: Carlene Coria on 12/14/2021 10:17:19 Andreoni, Duvan T. ([id number]) -------------------------------------------------------------------------------- Clinic Level of Care Assessment Details Patient Name: [name], [name] Date of Service: 12/14/2021 10:15 AM Medical Record Number: [id number] Patient Account Number: [id number] Date of Birth/Sex: August 16, 1940 (81 y.o. M) Treating RN: Carlene Coria Primary Care Nitin Mckowen: Owens Loffler Other Clinician: Referring Michayla Mcneil: Owens Loffler Treating  Meika Earll/Extender: Skipper Cliche in Treatment: 10 Clinic Level of Care Assessment Items TOOL 4 Quantity Score X - Use when only an EandM is performed on FOLLOW-UP visit 1 0 ASSESSMENTS - Nursing Assessment / Reassessment X - Reassessment of Co-morbidities (includes updates in patient status) 1 10 X- 1 5 Reassessment of Adherence to Treatment Plan ASSESSMENTS - Wound and Skin Assessment / Reassessment X - Simple Wound Assessment / Reassessment - one wound 1 5 []  - 0 Complex Wound Assessment / Reassessment - multiple wounds []  - 0 Dermatologic / Skin Assessment (not related to wound area) ASSESSMENTS - Focused Assessment []  - Circumferential Edema Measurements - multi extremities 0 []  - 0 Nutritional Assessment / Counseling / Intervention []  - 0 Lower Extremity Assessment (monofilament, tuning fork, pulses) []  - 0 Peripheral Arterial Disease Assessment (using hand held doppler) ASSESSMENTS - Ostomy and/or Continence Assessment and Care []  - Incontinence Assessment and Management 0 []  - 0 Ostomy Care Assessment and Management (repouching, etc.) PROCESS - Coordination of Care X - Simple Patient / Family Education for ongoing care 1 15 []  - 0 Complex (extensive) Patient / Family Education for ongoing care []  - 0 Staff obtains Programmer, systems, Records, Test Results / Process Orders []  - 0 Staff telephones HHA, Nursing Homes / Clarify orders / etc []  - 0 Routine Transfer to another Facility (non-emergent condition) []  - 0 Routine Hospital Admission (non-emergent condition) []  - 0 New Admissions / Biomedical engineer / Ordering NPWT, Apligraf, etc. []  - 0 Emergency Hospital Admission (emergent condition) X- 1 10 Simple Discharge Coordination []  - 0 Complex (extensive) Discharge Coordination PROCESS - Special Needs []  - Pediatric / Minor Patient Management 0 []  - 0 Isolation Patient Management []  - 0 Hearing / Language / Visual special needs []  - 0 Assessment of  Community assistance (transportation, D/C planning, etc.) []  - 0 Additional assistance / Altered mentation []  - 0 Support Surface(s) Assessment (bed, cushion, seat, etc.) INTERVENTIONS - Wound  Cleansing / Measurement Storti, Meldon T. ([id number]) X- 1 5 Simple Wound Cleansing - one wound []  - 0 Complex Wound Cleansing - multiple wounds X- 1 5 Wound Imaging (photographs - any number of wounds) []  - 0 Wound Tracing (instead of photographs) X- 1 5 Simple Wound Measurement - one wound []  - 0 Complex Wound Measurement - multiple wounds INTERVENTIONS - Wound Dressings X - Small Wound Dressing one or multiple wounds 1 10 []  - 0 Medium Wound Dressing one or multiple wounds []  - 0 Large Wound Dressing one or multiple wounds []  - 0 Application of Medications - topical []  - 0 Application of Medications - injection INTERVENTIONS - Miscellaneous []  - External ear exam 0 []  - 0 Specimen Collection (cultures, biopsies, blood, body fluids, etc.) []  - 0 Specimen(s) / Culture(s) sent or taken to Lab for analysis []  - 0 Patient Transfer (multiple staff / Civil Service fast streamer / Similar devices) []  - 0 Simple Staple / Suture removal (25 or less) []  - 0 Complex Staple / Suture removal (26 or more) []  - 0 Hypo / Hyperglycemic Management (close monitor of Blood Glucose) []  - 0 Ankle / Brachial Index (ABI) - do not check if billed separately X- 1 5 Vital Signs Has the patient been seen at the hospital within the last three years: Yes Total Score: 75 Level Of Care: New/Established - Level 2 Electronic Signature(s) Signed: 12/16/2021 4:19:23 PM By: Carlene Coria RN Entered By: Carlene Coria on 12/14/2021 10:33:30 Hogrefe, Elim T. ([id number]) -------------------------------------------------------------------------------- Lower Extremity Assessment Details Patient Name: [name], [name] Date of Service: 12/14/2021 10:15 AM Medical Record Number: [id number] Patient Account Number: [id number] Date of  Birth/Sex: 1941-04-11 (81 y.o. M) Treating RN: Carlene Coria Primary Care Tywan Siever: Owens Loffler Other Clinician: Referring Tony Friscia: Owens Loffler Treating Jahmya Onofrio/Extender: Skipper Cliche in Treatment: 10 Electronic Signature(s) Signed: 12/16/2021 4:19:23 PM By: Carlene Coria RN Entered By: Carlene Coria on 12/14/2021 10:23:01 Jipson, Omkar T. ([id number]) -------------------------------------------------------------------------------- Multi Wound Chart Details Patient Name: Padgett, Kamaal T. Date of Service: 12/14/2021 10:15 AM Medical Record Number: [id number] Patient Account Number: [id number] Date of Birth/Sex: 1940-05-01 (81 y.o. M) Treating RN: Carlene Coria Primary Care Dquan Cortopassi: Owens Loffler Other Clinician: Referring Jasleen Riepe: Owens Loffler Treating Aerin Delany/Extender: Skipper Cliche in Treatment: 10 Vital Signs Height(in): 69 Pulse(bpm): 109 Weight(lbs): 174 Blood Pressure(mmHg): 110/64 Body Mass Index(BMI): 25.7 Temperature(F): 98.7 Respiratory Rate(breaths/min): 18 Photos: [2:No Photos] [N/A:N/A] Wound Location: [2:Left, Proximal Forearm] [N/A:N/A] Wounding Event: [2:Trauma] [N/A:N/A] Primary Etiology: [2:Trauma, Other] [N/A:N/A] Comorbid History: [2:Coronary Artery Disease, Deep Vein N/A Thrombosis, Hypertension, Peripheral Arterial Disease] Date Acquired: [2:12/03/2021] [N/A:N/A] Weeks of Treatment: [2:1] [N/A:N/A] Wound Status: [2:Open] [N/A:N/A] Wound Recurrence: [2:No] [N/A:N/A] Measurements L x W x D (cm) [2:0.3x0.4x0.1] [N/A:N/A] Area (cm) : [2:0.094] [N/A:N/A] Volume (cm) : [2:0.009] [N/A:N/A] % Reduction in Area: [2:93.80%] [N/A:N/A] % Reduction in Volume: [2:97.00%] [N/A:N/A] Classification: [2:Full Thickness Without Exposed Support Structures] [N/A:N/A] Exudate Amount: [2:Medium] [N/A:N/A] Exudate Type: [2:Serosanguineous] [N/A:N/A] Exudate Color: [2:red, brown] [N/A:N/A] Granulation Amount: [2:Large (67-100%)] [N/A:N/A] Granulation  Quality: [2:Red, Pink] [N/A:N/A] Necrotic Amount: [2:Small (1-33%)] [N/A:N/A] Exposed Structures: [2:Fat Layer (Subcutaneous Tissue): N/A Yes None] [N/A:N/A] Treatment Notes Electronic Signature(s) Signed: 12/16/2021 4:19:23 PM By: Carlene Coria RN Entered By: Carlene Coria on 12/14/2021 10:23:12 Duggar, Bryston T. ([id number]) -------------------------------------------------------------------------------- [place] Details Patient Name: [name], Lovett T. Date of Service: 12/14/2021 10:15 AM Medical Record Number: [id number] Patient Account Number: [id number] Date of Birth/Sex: 1940-05-28 (81 y.o. M) Treating RN: Carlene Coria Primary Care Laiklyn Pilkenton: Owens Loffler Other Clinician:  Referring Amedeo Detweiler: Owens Loffler Treating Janene Yousuf/Extender: Skipper Cliche in Treatment: 10 Active Inactive Electronic Signature(s) Signed: 12/16/2021 4:19:23 PM By: Carlene Coria RN Entered By: Carlene Coria on 12/14/2021 10:23:06 Demartini, Xayvion T. ([id number]) -------------------------------------------------------------------------------- Pain Assessment Details Patient Name: [name], Prem T. Date of Service: 12/14/2021 10:15 AM Medical Record Number: [id number] Patient Account Number: [id number] Date of Birth/Sex: 1940/06/04 (81 y.o. M) Treating RN: Carlene Coria Primary Care Clifford Coudriet: Owens Loffler Other Clinician: Referring Reet Scharrer: Owens Loffler Treating Rivkah Wolz/Extender: Skipper Cliche in Treatment: 10 Active Problems Location of Pain Severity and Description of Pain Patient Has Paino No Site Locations Pain Management and Medication Current Pain Management: Electronic Signature(s) Signed: 12/16/2021 4:19:23 PM By: Carlene Coria RN Entered By: Carlene Coria on 12/14/2021 10:18:30 Bobb, Kashawn T. ([id number]) -------------------------------------------------------------------------------- Patient/Caregiver Education Details Patient Name: [name], Zelma T. Date of  Service: 12/14/2021 10:15 AM Medical Record Number: [id number] Patient Account Number: [id number] Date of Birth/Gender: [birth year] (81 y.o. M) Treating RN: Carlene Coria Primary Care Physician: Owens Loffler Other Clinician: Referring Physician: Owens Loffler Treating Physician/Extender: Skipper Cliche in Treatment: 10 Education Assessment Education Provided To: Patient Education Topics Provided Wound/Skin Impairment: Methods: Explain/Verbal Responses: State content correctly Electronic Signature(s) Signed: 12/16/2021 4:19:23 PM By: Carlene Coria RN Entered By: Carlene Coria on 12/14/2021 10:33:44 Wigen, Jeris T. ([id number]) -------------------------------------------------------------------------------- Wound Assessment Details Patient Name: Thibeau, Said T. Date of Service: 12/14/2021 10:15 AM Medical Record Number: [id number] Patient Account Number: [id number] Date of Birth/Sex: Oct 11, 1940 (81 y.o. M) Treating RN: Carlene Coria Primary Care Jibril Mcminn: Owens Loffler Other Clinician: Referring Mehtab Dolberry: Owens Loffler Treating Varnell Orvis/Extender: Skipper Cliche in Treatment: 10 Wound Status Wound Number: 2 Primary Trauma, Other Etiology: Wound Location: Left, Proximal Forearm Wound Open Wounding Event: Trauma Status: Date Acquired: 12/03/2021 Comorbid Coronary Artery Disease, Deep Vein Thrombosis, Weeks Of Treatment: 1 History: Hypertension, Peripheral Arterial Disease Clustered Wound: No Wound Measurements Length: (cm) 0.3 Width: (cm) 0.4 Depth: (cm) 0.1 Area: (cm) 0.094 Volume: (cm) 0.009 % Reduction in Area: 93.8% % Reduction in Volume: 97% Epithelialization: None Tunneling: No Undermining: No Wound Description Classification: Full Thickness Without Exposed Support Structures Exudate Amount: Medium Exudate Type: Serosanguineous Exudate Color: red, brown Foul Odor After Cleansing: No Slough/Fibrino Yes Wound Bed Granulation Amount: Large (67-100%)  Exposed Structure Granulation Quality: Red, Pink Fat Layer (Subcutaneous Tissue) Exposed: Yes Necrotic Amount: Small (1-33%) Treatment Notes Wound #2 (Forearm) Wound Laterality: Left, Proximal Cleanser [place] Ancillary Kit - 15 Day Supply Discharge Instruction: Use supplies as instructed; Kit contains: (15) Saline Bullets; (15) 3x3 Gauze; 15 pr Gloves Soap and Water Discharge Instruction: Gently cleanse wound with antibacterial soap, rinse and pat dry prior to dressing wounds Peri-Wound Care Topical Primary Dressing Xeroform-HBD 2x2 (in/in) Discharge Instruction: Apply Xeroform-HBD 2x2 (in/in) as directed Secondary Dressing ABD Pad 5x9 (in/in) Discharge Instruction: Cover with ABD pad Kerlix 4.5 x 4.1 (in/yd) Discharge Instruction: Apply Kerlix 4.5 x 4.1 (in/yd) as instructed Secured With [employer] Dressing, Latex-free, Size 5, Small-Head / Shoulder / Thigh Compression Wrap Haglund, Farhan TMarland Kitchen ([id number]) Compression Stockings Add-Ons Electronic Signature(s) Signed: 12/16/2021 4:19:23 PM By: Carlene Coria RN Entered By: Carlene Coria on 12/14/2021 10:22:39 Haertel, Surya T. ([id number]) -------------------------------------------------------------------------------- Vitals Details Patient Name: [name], Shahzad T. Date of Service: 12/14/2021 10:15 AM Medical Record Number: [id number] Patient Account Number: [id number] Date of Birth/Sex: 1940-06-13 (81 y.o. M) Treating RN: Carlene Coria Primary Care Anda Sobotta: Owens Loffler Other Clinician: Referring Rhonin Trott: Owens Loffler Treating Rosella Crandell/Extender: Skipper Cliche in Treatment: 10 Vital Signs Time Taken: 10:17 Temperature (F): 98.7  Height (in): 69 Pulse (bpm): 109 Weight (lbs): 174 Respiratory Rate (breaths/min): 18 Body Mass Index (BMI): 25.7 Blood Pressure (mmHg): 110/64 Reference Range: 80 - 120 mg / dl Electronic Signature(s) Signed: 12/16/2021 4:19:23 PM By: Carlene Coria RN Entered By: Carlene Coria on 12/14/2021  10:18:02

## 2021-12-16 NOTE — Progress Notes (Signed)
Pt has been started on doxycycline, 100 mg, x 8 weeks for dermis staph infection. Pt in today to monitor for interactions. Pt started doxycycline on 6/27.  Continue 1/2 tablet daily except take 1 tablet on Sundays and Wednesdays.  Recheck in 2 week.

## 2021-12-21 ENCOUNTER — Ambulatory Visit: Payer: Medicare Other | Admitting: Physician Assistant

## 2021-12-21 ENCOUNTER — Other Ambulatory Visit: Payer: Self-pay | Admitting: Cardiovascular Disease

## 2021-12-21 ENCOUNTER — Other Ambulatory Visit: Payer: Self-pay | Admitting: Dermatology

## 2021-12-21 DIAGNOSIS — R21 Rash and other nonspecific skin eruption: Secondary | ICD-10-CM

## 2021-12-23 ENCOUNTER — Other Ambulatory Visit: Payer: Self-pay | Admitting: Cardiovascular Disease

## 2021-12-28 ENCOUNTER — Ambulatory Visit: Payer: Medicare Other | Admitting: Physician Assistant

## 2021-12-29 DIAGNOSIS — L011 Impetiginization of other dermatoses: Secondary | ICD-10-CM | POA: Diagnosis not present

## 2021-12-29 DIAGNOSIS — L0889 Other specified local infections of the skin and subcutaneous tissue: Secondary | ICD-10-CM | POA: Diagnosis not present

## 2021-12-29 DIAGNOSIS — L2089 Other atopic dermatitis: Secondary | ICD-10-CM | POA: Diagnosis not present

## 2021-12-30 ENCOUNTER — Ambulatory Visit (INDEPENDENT_AMBULATORY_CARE_PROVIDER_SITE_OTHER): Payer: Medicare Other

## 2021-12-30 DIAGNOSIS — Z7901 Long term (current) use of anticoagulants: Secondary | ICD-10-CM

## 2021-12-30 LAB — POCT INR: INR: 1.7 — AB (ref 2.0–3.0)

## 2021-12-30 NOTE — Progress Notes (Signed)
Pt has been started on doxycycline, 100 mg, x 8 weeks for dermis staph infection. Pt in today to monitor for interactions. Pt started doxycycline on 6/27.   Increase dose today and tomorrow to take 1 tablet and the continue 1/2 tablet daily except take 1 tablet on Sundays and Wednesdays.  Recheck in 2 week.

## 2021-12-30 NOTE — Patient Instructions (Addendum)
Pre visit review using our clinic review tool, if applicable. No additional management support is needed unless otherwise documented below in the visit note.  Increase dose today and tomorrow to take 1 tablet and the continue 1/2 tablet daily except take 1 tablet on Sundays and Wednesdays.  Recheck in 2 week.

## 2022-01-12 ENCOUNTER — Other Ambulatory Visit: Payer: Self-pay | Admitting: Cardiovascular Disease

## 2022-01-13 ENCOUNTER — Ambulatory Visit (INDEPENDENT_AMBULATORY_CARE_PROVIDER_SITE_OTHER): Payer: Medicare Other

## 2022-01-13 DIAGNOSIS — Z7901 Long term (current) use of anticoagulants: Secondary | ICD-10-CM

## 2022-01-13 LAB — POCT INR: INR: 1.7 — AB (ref 2.0–3.0)

## 2022-01-13 NOTE — Patient Instructions (Addendum)
Pre visit review using our clinic review tool, if applicable. No additional management support is needed unless otherwise documented below in the visit note.  Increase dose today to take 1 tablet and increase dose tomorrow to take 1 tablet and then change weekly dose to take 1/2 tablet daily except take 1 tablet on Mondays, Wednesdays and Fridays.  Recheck in 2 week.

## 2022-01-13 NOTE — Progress Notes (Signed)
Pt has been started on doxycycline, 100 mg, x 8 weeks for dermis staph infection. Pt in today to monitor for interactions. Pt started doxycycline on 6/27.  Increase dose today to take 1 tablet and increase dose tomorrow to take 1 tablet and then change weekly dose to take 1/2 tablet daily except take 1 tablet on Mondays, Wednesdays and Fridays.  Recheck in 2 week.

## 2022-01-16 ENCOUNTER — Other Ambulatory Visit: Payer: Self-pay | Admitting: Cardiovascular Disease

## 2022-01-17 ENCOUNTER — Encounter: Payer: Self-pay | Admitting: Nurse Practitioner

## 2022-01-17 ENCOUNTER — Other Ambulatory Visit
Admission: RE | Admit: 2022-01-17 | Discharge: 2022-01-17 | Disposition: A | Discharge status: Home / Self Care | Payer: Medicare Other | Attending: Nurse Practitioner | Admitting: Nurse Practitioner

## 2022-01-17 ENCOUNTER — Ambulatory Visit (INDEPENDENT_AMBULATORY_CARE_PROVIDER_SITE_OTHER): Payer: Medicare Other

## 2022-01-17 ENCOUNTER — Ambulatory Visit: Payer: Medicare Other | Attending: Nurse Practitioner | Admitting: Nurse Practitioner

## 2022-01-17 VITALS — BP 116/60 | HR 83 | Ht 69.0 in | Wt 169.0 lb

## 2022-01-17 DIAGNOSIS — E785 Hyperlipidemia, unspecified: Secondary | ICD-10-CM | POA: Diagnosis not present

## 2022-01-17 DIAGNOSIS — I350 Nonrheumatic aortic (valve) stenosis: Secondary | ICD-10-CM | POA: Diagnosis not present

## 2022-01-17 DIAGNOSIS — N183 Chronic kidney disease, stage 3 unspecified: Secondary | ICD-10-CM | POA: Diagnosis not present

## 2022-01-17 DIAGNOSIS — I493 Ventricular premature depolarization: Secondary | ICD-10-CM | POA: Diagnosis not present

## 2022-01-17 DIAGNOSIS — Z86718 Personal history of other venous thrombosis and embolism: Secondary | ICD-10-CM | POA: Diagnosis not present

## 2022-01-17 DIAGNOSIS — I739 Peripheral vascular disease, unspecified: Secondary | ICD-10-CM | POA: Insufficient documentation

## 2022-01-17 DIAGNOSIS — I5032 Chronic diastolic (congestive) heart failure: Secondary | ICD-10-CM | POA: Insufficient documentation

## 2022-01-17 DIAGNOSIS — I255 Ischemic cardiomyopathy: Secondary | ICD-10-CM

## 2022-01-17 DIAGNOSIS — I10 Essential (primary) hypertension: Secondary | ICD-10-CM | POA: Insufficient documentation

## 2022-01-17 DIAGNOSIS — I7143 Infrarenal abdominal aortic aneurysm, without rupture: Secondary | ICD-10-CM | POA: Insufficient documentation

## 2022-01-17 DIAGNOSIS — I6523 Occlusion and stenosis of bilateral carotid arteries: Secondary | ICD-10-CM | POA: Insufficient documentation

## 2022-01-17 DIAGNOSIS — I2510 Atherosclerotic heart disease of native coronary artery without angina pectoris: Secondary | ICD-10-CM | POA: Diagnosis not present

## 2022-01-17 LAB — BASIC METABOLIC PANEL
Anion gap: 8 (ref 5–15)
BUN: 32 mg/dL — ABNORMAL HIGH (ref 8–23)
CO2: 29 mmol/L (ref 22–32)
Calcium: 9.4 mg/dL (ref 8.9–10.3)
Chloride: 104 mmol/L (ref 98–111)
Creatinine, Ser: 1.69 mg/dL — ABNORMAL HIGH (ref 0.61–1.24)
GFR, Estimated: 41 mL/min — ABNORMAL LOW (ref >60)
Glucose, Bld: 97 mg/dL (ref 70–99)
Potassium: 4.8 mmol/L (ref 3.5–5.1)
Sodium: 141 mmol/L (ref 135–145)

## 2022-01-17 LAB — MAGNESIUM: Magnesium: 2.2 mg/dL (ref 1.7–2.4)

## 2022-01-17 NOTE — Patient Instructions (Signed)
Medication Instructions:  - Your physician recommends that you continue on your current medications as directed. Please refer to the Current Medication list given to you today.  *If you need a refill on your cardiac medications before your next appointment, please call your pharmacy*   Lab Work: - Your physician recommends that you have lab work today:   B[facility]/ [place] Entrance at [facility] 1st desk on the right to check in (REGISTRATION)  Lab hours: Monday- Friday (7:30 am- 5:30 pm)  If you have labs (blood work) drawn today and your tests are completely normal, you will receive your results only by: MyChart Message (if you have MyChart) OR A paper copy in the mail If you have any lab test that is abnormal or we need to change your treatment, we will call you to review the results.   Testing/Procedures:  1) Heart Monitor:  Length of Wear: 3 days  Your monitor will be mailed to your home address within 3-5 business days. However, if you have not received your monitor after 5 business days please send [place] a MyChart message or call the office at [phone], so we may follow up on this for you.   Your physician has recommended that you wear a Zio XT (heart) monitor.   This monitor is a medical device that records the heart's electrical activity. Doctors most often use these monitors to diagnose arrhythmias. Arrhythmias are problems with the speed or rhythm of the heartbeat. The monitor is a small device applied to your chest. You can wear one while you do your normal daily activities. While wearing this monitor if you have any symptoms to push the button and record what you felt. Once you have worn this monitor for the period of time provider prescribed (Usually 14 days), you will return the monitor device in the postage paid box. Once it is returned they will download the data collected and provide [place] with a report which the provider will then review and we will call you with  those results. Important tips:  Avoid showering during the first 24 hours of wearing the monitor. Avoid excessive sweating to help maximize wear time. Do not submerge the device, no hot tubs, and no swimming pools. Keep any lotions or oils away from the patch. After 24 hours you may shower with the patch on. Take brief showers with your back facing the shower head.  Do not remove patch once it has been placed because that will interrupt data and decrease adhesive wear time. Push the button when you have any symptoms and write down what you were feeling. Once you have completed wearing your monitor, remove and place into box which has postage paid and place in your outgoing mailbox.  If for some reason you have misplaced your box then call our office and we can provide another box and/or mail it off for you.     2) Echocardiogram: (in November 2023) - Your physician has requested that you have an echocardiogram. Echocardiography is a painless test that uses sound waves to create images of your heart. It provides your doctor with information about the size and shape of your heart and how well your heart's chambers and valves are working. This procedure takes approximately one hour. There are no restrictions for this procedure. There is a possibility that an IV may need to be started during your test to inject an image enhancing agent. This is done to obtain more optimal pictures of your  heart. Therefore we ask that you do at least drink some water prior to coming in to hydrate your veins.    3) Carotid duplex: (in December 2023)  - Your physician has requested that you have a carotid duplex. This test is an ultrasound of the carotid arteries in your neck. It looks at blood flow through these arteries that supply the brain with blood. Allow one hour for this exam. There are no restrictions or special instructions.   4) ABI's & Lower Arterial Duplex: (in December 2023)  - Your physician has  requested that you have an ankle brachial index (ABI). During this test an ultrasound and blood pressure cuff are used to evaluate the arteries that supply the arms and legs with blood. Allow thirty minutes for this exam. There are no restrictions or special instructions.  -Your physician has requested that you have a lower extremity arterial duplex. This test is an ultrasound of the arteries in the legs. It looks at arterial blood flow in the legs. Allow one hour for Lower Arterial scans. There are no restrictions or special instructions   Follow-Up: At [facility], you and your health needs are our priority.  As part of our continuing mission to provide you with exceptional heart care, we have created designated Provider Care Teams.  These Care Teams include your primary Cardiologist (physician) and Advanced Practice Providers (APPs -  Physician Assistants and Nurse Practitioners) who all work together to provide you with the care you need, when you need it.  We recommend signing up for the patient portal called "MyChart".  Sign up information is provided on this After Visit Summary.  MyChart is used to connect with patients for Virtual Visits (Telemedicine).  Patients are able to view lab/test results, encounter notes, upcoming appointments, etc.  Non-urgent messages can be sent to your provider as well.   To learn more about what you can do with MyChart, go to [email or web address].    Your next appointment:   3 month(s)  The format for your next appointment:   In Person  Provider:   [name], MD (only)   Other Instructions N/a  Important Information About Sugar

## 2022-01-17 NOTE — Progress Notes (Signed)
Office Visit    Patient Name: [name] Date of Encounter: 01/17/2022  Primary Care Provider:  Owens Loffler, MD Primary Cardiologist:  [name], MD  Chief Complaint    81 year old male with a history of peripheral arterial disease, renal artery stenosis, carotid arterial disease, tobacco abuse, COPD, abdominal aortic aneurysm, intracranial hemorrhage in the setting of malignant hypertension, hyperlipidemia, bilateral lower extremity DVTs, heart failure with improved EF/ischemic cardiomyopathy, and CAD status post CABG x4 (March 2013), who presents for follow-up related to CAD and aortic stenosis.  Past Medical History    Past Medical History:  Diagnosis Date   (HFimpEF) heart failure with improved ejection fraction ([place])    a. 06/2011 Echo: EF 35-40%; b. 06/2012 Echo: EF 50%; c. 12/2016 Echo: EF 55-60%, no rwma, GRI DD, mild AS/MR, nl RV fxn, nl RVSP; d. 02/2021 Echo: EF 55%, GrII DD. Mild MR. Mild to mod TR. Sev AS by VTI.   AAA (abdominal aortic aneurysm) ([place])    a. 05/2020 Abd u/s: 3.6cm.   Aortic calcification (HCC) 05/01/2013   Bilateral renal artery stenosis ([place])    a. 06/2013 Angio/PTA: LRA: 95 (5x18 Herculink stent), RRA 60ost; b. 04/2021 Angio: mod RRA stenosis w/ patent LRA stent.   Carotid arterial disease ([place])    a. 05/2020 Carotid U/S: RICA 8-65%, LICA 78-46%   Coronary artery disease    a. 2013 s/p CABG x 3 (LIMA->LAD, VG->OM, VG->RPDA; b. 06/2012 MV: no ischemia; c. 02/2021 Cath: LM 100, RCA 90ost, 170m free LIMA->LAD nl, VG->OM2 nl, VG->RPDA nl. Mod AS (mean grad 140mg, AVA 1.1cm^2). RHC w/ elev filling pressures.   History of prior cigarette smoking 04/11/2008   Qualifier: Diagnosis of  By: BeDiona BrownerD, Amy     Hypertension    Hypothyroidism    Intraventricular hemorrhage ([facility]2014   Ischemic cardiomyopathy    a. 06/2011 Echo: EF 35-40%; b. 06/2012 Echo: EF 50%; c. 12/2016 Echo: EF 55-60%, no rwma, GRI DD; d. 02/2021 Echo: Ef 55%, GrII DD.    Moderate aortic stenosis    a. 02/2021 Echo: EF 55%, GrII DD. Sev Ca2+ of AoV. Sev AS w/ AVA by VTI 0.79cm^2; b. 02/2021 Cath: Mod AS w/ mean grad 1580m. AVA 1.1cm^2.   Peripheral arterial disease ([facility]  a. Previous left lower extremity stenting by Dr. SanJamal Collinb. 12/2012 s/p bilat ostial common iliac stenting; c. 04/2021 Angio: Patent LRA stent, mod RRA stenosis. Small AAA. Sev plaque RSFA 2/ subtl occl of inf branch of profunda. Diff Ca2+ SFA dzs w/ collats from profunda and 3V runoff. Diffuse LSFA dzs w/ collats from profunda. Subtl PT dzs. 3V runoff-->Med Rx.   Right leg DVT ([facility]CCLee4/21/2014   Subclavian artery stenosis, left ([facility]CC[place]2/2014   Status post stenting of the ostium and self-expanding stent placement to the left axillary artery   Venous insufficiency    Past Surgical History:  Procedure Laterality Date   ABDOMINAL ANGIOGRAM  07/10/2013   WITH BI-FEMORAL RUNOFF       DR ARIFletcher AnonABDOMINAL AORTAGRAM N/A 12/26/2012   Procedure: ABDOMINAL AORMaxcine HamSurgeon: MuhWellington HampshireD;  [facility];  Service: Cardiovascular;  Laterality: N/A;   ABDOMINAL AORTAGRAM N/A 07/10/2013   Procedure: ABDOMINAL AORMaxcine HamSurgeon: MuhWellington HampshireD;  [facility];  Service: Cardiovascular;  Laterality: N/A;   ABDOMINAL AORTOGRAM W/LOWER EXTREMITY N/A 05/12/2021   Procedure: ABDOMINAL AORTOGRAM W/LOWER EXTREMITY;  Surgeon: AriWellington HampshireD;  Location: [place] [facility];  Service: Cardiovascular;  Laterality: N/A;   ANGIOPLASTY / STENTING FEMORAL  05/2012   ANGIOPLASTY / STENTING ILIAC Bilateral 12/26/2012   ARCH AORTOGRAM N/A 06/20/2012   Procedure: ARCH AORTOGRAM;  Surgeon: Wellington Hampshire, MD;  Location: [facility];  Service: Cardiovascular;  Laterality: N/A;   CARDIAC CATHETERIZATION  2/14   [place]   CHOLECYSTECTOMY  1990's   CORONARY ARTERY BYPASS GRAFT  07/11/2011   Procedure: CORONARY ARTERY BYPASS GRAFTING (CABG);  Surgeon: Grace Isaac, MD;  Location: [place];   Service: Open Heart Surgery;  Laterality: N/A;  Times 3. On Pump. Using right greater saphenous vein and left internal mammary artery.    HERNIA REPAIR     INSERTION OF ILIAC STENT Bilateral 12/26/2012   Procedure: INSERTION OF ILIAC STENT;  Surgeon: Wellington Hampshire, MD;  Location: [facility];  Service: Cardiovascular;  Laterality: Bilateral;  Bilateral Common Iliac Artery   LEFT AND RIGHT HEART CATHETERIZATION WITH CORONARY ANGIOGRAM  07/07/2011   Procedure: LEFT AND RIGHT HEART CATHETERIZATION WITH CORONARY ANGIOGRAM;  Surgeon: Minna Merritts, MD;  Location: [facility] [facility];  Service: Cardiovascular;;   LOWER EXTREMITY VENOGRAPHY Bilateral 12/30/2016   Procedure: Lower Extremity Venography;  Surgeon: Katha Cabal, MD;  Location: [place] [facility];  Service: Cardiovascular;  Laterality: Bilateral;   PERCUTANEOUS STENT INTERVENTION  06/20/2012   Procedure: PERCUTANEOUS STENT INTERVENTION;  Surgeon: Wellington Hampshire, MD;  Location: [facility];  Service: Cardiovascular;;   RENAL ARTERY ANGIOPLASTY Left 07/10/2013   DR ARIDA   RIGHT/LEFT HEART CATH AND CORONARY/GRAFT ANGIOGRAPHY N/A 03/15/2021   Procedure: RIGHT/LEFT HEART CATH AND CORONARY/GRAFT ANGIOGRAPHY;  Surgeon: Wellington Hampshire, MD;  Location: [facility];  Service: Cardiovascular;  Laterality: N/A;   SUBCLAVIAN ARTERY STENT  05/2012   "2 stents" (12/26/2012)   TOOTH EXTRACTION  Spring 2017   mulitple (6)   VENA CAVA FILTER PLACEMENT  07/2012   Removable    Allergies  Allergies  Allergen Reactions   Plavix [Clopidogrel Bisulfate]     Brain hemorrhage prev while on plavix and aspirin    History of Present Illness    81 year old male with the above complex past medical history including peripheral arterial disease, renal artery stenosis, carotid arterial disease, tobacco abuse, COPD, abdominal aortic aneurysm, intracranial hemorrhage in the setting of malignant hypertension, hyperlipidemia, bilateral lower extremity DVTs,  heart failure with improved EF, ischemic cardiomyopathy, and CAD status post CABG x4.  He had previously undergone stenting of the left subclavian and left axillary arteries as well as bilateral common iliac and left renal arteries.  As noted above, in March 2013, he underwent CABG x3 with placement of a LIMA to LAD, vein graft to the RPDA, and vein graft to the obtuse marginal.  He suffered an intracranial hemorrhage in 2014 which was suspected to be due to malignant hypertension and dual antiplatelet therapy.  Shortly thereafter, he was diagnosed with extensive right-sided DVT and underwent IVC filter in the setting of contraindication for anticoagulation.  In 2018, he had extensive bilateral occlusive DVTs, which required catheter-based therapy and he has been on anticoagulation since then (warfarin).  EF was previously as low as 35 to 40% prior to his bypass surgery with subsequent improvement to 55 to 60% by echo in September 2018.  In the fall 2023, he was seen secondary to worsening dyspnea, bilateral leg claudication, and upper extremity rash.  Rash improved with prednisone but worsened again  following completion of course.  Echo in November 2022 showed an EF of 55% with grade 2 diastolic dysfunction, mild MR, mild-moderate TR, and severe aortic stenosis by VTI with a valve area of 0.79 cm, but only mild aortic stenosis by gradient.  The aortic root was mildly dilated at 37 mm.  In the setting of suggestion of severe aortic stenosis with progressive dyspnea and known CAD, we arrange for diagnostic catheterization which took place in November 2023 and showed 3 of 3 patent grafts with known occlusive left main and RCA disease.  Aortic stenosis was moderate by gradient (15 mmHg) and valve area (1.1 cm).  Right heart catheterization was notable for elevated filling pressures and Lasix 20 mg daily was resumed (previously reduced to as needed use only in the setting of rising creatinine).  Due to worsening  claudication at follow-up, he underwent repeat ABIs which were abnormal at 0.61 on the right and 0.56 on the left.  Lower extremity angiogram was performed in January 2023 and showed patent left renal artery stent with moderate right renal artery stenosis, patent bilateral iliac kissing stents extending into the distal aorta with mild restenosis, subtotal occlusion of the inferior branch of the right profunda with diffuse calcified SFA disease and collaterals from the profunda and three-vessel runoff below the knee.  On the left, he had diffuse calcified disease of the SFA with collaterals from the profunda and a subtotal occlusion of the popliteal artery behind the knee with three-vessel runoff below the knee.  There were not felt to be any good endovascular options and though endarterectomy of the ostial SFA and profunda could be considered, he was not felt to be a good surgical candidate.  Medical therapy was recommended with a trial of cilostazol.  Mr. Rutledge was last seen in cardiology clinic in February 2023 at which time he noted some improvement on cilostazol therapy.  He continued to report a rash and has since been seen by dermatology and diagnosed with undetermined dermatitis.  He has been on triamcinolone cream and doxycycline since July and has noted resolution of rash.  He hopes to come off of doxycycline in a few weeks, when he follows up with dermatology.  He has not been having any chest pain or dyspnea.  He denies palpitations, PND, orthopnea, dizziness, syncope, edema, or early satiety.,  He notes that he has more energy and has been feeling better recently, than he has in a long time.  He is sleeping much better.  His weight is down about 5 pounds.  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  He does not walk the other days of the week.  He typically experiences bilateral lower extremity claudication that starts in his hips and his described as a  "weakness" in his hips with pain in his lower legs.  He can walk about 75 feet prior to symptoms starting.  Overall, this is stable over the past year.  He is not sure sure that cilostazol has helped at all.  He has not had any resting claudication.  Home Medications    Current Outpatient Medications  Medication Sig Dispense Refill   amLODipine (NORVASC) 5 MG tablet TAKE 1 TABLET BY MOUTH EVERY DAY 30 tablet 0   atorvastatin (LIPITOR) 20 MG tablet TAKE 1 TABLET BY MOUTH EVERY DAY 30 tablet 5   carvedilol (COREG) 6.25 MG tablet TAKE 1 TABLET BY MOUTH TWICE A DAY WITH MEALS 180  tablet 1   cilostazol (PLETAL) 50 MG tablet TAKE 1 TABLET BY MOUTH TWICE A DAY 180 tablet 1   doxycycline (VIBRAMYCIN) 100 MG capsule Take 100 mg by mouth 2 (two) times daily.     furosemide (LASIX) 40 MG tablet TAKE 1 TABLET BY MOUTH EVERY DAY 90 tablet 0   levothyroxine (SYNTHROID) 150 MCG tablet TAKE ONE TABLET BY MOUTH EVERY DAY BEFORE BREAKFAST 90 tablet 1   mupirocin ointment (BACTROBAN) 2 % SMARTSIG:sparingly Both Nares Twice Daily     triamcinolone ointment (KENALOG) 0.1 % APPLY TOPICALLY TO AFFECTED AREAS TWICE A DAY FOR 14 DAYS THEN MAY APPLY 5 DAYS WEEKLY TO AFFECTED AREAS 80 g 0   warfarin (COUMADIN) 5 MG tablet Take 0.5-1 tablets (2.5-5 mg total) by mouth See admin instructions. TAKE 1/2 TABLET DAILY BY MOUTH EXCEPT TAKE 1 TABLET ON MON, WED, FRI OR AS DIRECTED ANTICOAGULATION CLINIC 105 tablet 1   No current facility-administered medications for this visit.     Review of Systems    Bilateral lower extremity claudication from the hips down.  He denies chest pain, dyspnea, palpitations, PND, orthopnea, dizziness, syncope, edema, or early satiety.  All other systems reviewed and are otherwise negative except as noted above.    Physical Exam    VS:  BP 116/60 (BP Location: Left Arm, Patient Position: Sitting, Cuff Size: Normal)   Pulse 83   Ht '5\' 9"'$  (1.753 m)   Wt 169 lb (76.7 kg)   SpO2 98%   BMI 24.96  kg/m  , BMI Body mass index is 24.96 kg/m.     GEN: Well nourished, well developed, in no acute distress. HEENT: normal. Neck: Supple, no JVD, carotid bruits, or masses. Cardiac: RRR, 1/6 systolic murmur at the upper sternal borders, no rubs or gallops.. No clubbing, cyanosis, edema.  Radials 2+ bilaterally.  Unable to palpate PTs. Respiratory:  Respirations regular and unlabored, clear to auscultation bilaterally. GI: Soft, nontender, nondistended, BS + x 4. MS: no deformity or atrophy. Skin: warm and dry, no rash. Neuro:  Strength and sensation are intact. Psych: Normal affect.  Accessory Clinical Findings    ECG personally reviewed by me today -.  Sinus rhythm, 83, ventricular trigeminy, right bundle branch block, inferior T wave inversion- no acute changes.  Lab Results  Component Value Date   WBC 7.7 11/09/2021   HGB 14.2 11/09/2021   HCT 46.7 11/09/2021   MCV 91.4 11/09/2021   PLT 189 11/09/2021   Lab Results  Component Value Date   CREATININE 1.57 (H) 11/09/2021   BUN 29 (H) 11/09/2021   NA 144 11/09/2021   K 4.4 11/09/2021   CL 109 11/09/2021   CO2 30 11/09/2021   Lab Results  Component Value Date   ALT 7 07/05/2021   AST 11 07/05/2021   ALKPHOS 80 07/05/2021   BILITOT 0.7 07/05/2021   Lab Results  Component Value Date   CHOL 121 07/05/2021   HDL 59.40 07/05/2021   LDLCALC 39 07/05/2021   TRIG 115.0 07/05/2021   CHOLHDL 2 07/05/2021    Lab Results  Component Value Date   HGBA1C 5.5 07/05/2021    Assessment & Plan    1.  Coronary artery disease: Status post CABG x3 in 2013.  Nonischemic Myoview in 2014.  Diagnostic catheterization in November 2022 with 3 of 3 patent grafts and elevated filling pressures.  Moderate aortic stenosis by valve area/gradient at that time.  He has not been have any chest pain or  dyspnea and notes that overall, he has been feeling better than he has in quite some time.  He remains on beta-blocker and statin therapy.  2.   Moderate aortic stenosis: Echo in November 2022 suggested severe aortic stenosis by valve area but only mild by gradient.  Diagnostic catheterization showed moderate aortic stenosis with a gradient of 15 mmHg and a valve area of 1.1 cm.  As above, has been doing well without chest pain, dyspnea, presyncope, or syncope.  Plan for follow-up echo in November.  3.  Peripheral arterial disease/lower extremity claudication: Worsening ABIs late last year with lower extremity duplex this year showing significant bilateral SFA disease with collateral flow in bilateral three-vessel runoff.  He has chronic, stable symptoms with claudication occurring after walking about 75 feet.  He is not sure that cilostazol has helped much.  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  We discussed the importance of regular walking program and encouraged him to walk at least 30 minutes a day on the 4 other days a week when he is not flying his complaints.  We will plan to follow-up ABIs in December.  Continue statin and cilostazol therapy.  4.  Frequent PVCs: Ventricular trigeminy noted on twelve-lead ECG today with frequent PVCs on July ECG.  He is asymptomatic.  Echo in November 2022 showed an EF of 55% with grade 2 diastolic dysfunction.  I will follow-up a basic metabolic panel and magnesium today.  I will place a 3-day ZIO monitor for PVC burden and if high, he will need EP evaluation for antiarrhythmic therapy.  He will be due for follow-up echo this November and we will arrange this today.  5.  Essential hypertension: Stable.  6.  Chronic HFpEF: Euvolemic on examination and weight down about 5 pounds from earlier this year.  He is now taking Lasix 40 mg once a day.  BUN and creatinine were trending higher in July.  Follow-up basic metabolic panel today.  Heart rate and blood pressure stable on beta-blocker and amlodipine therapy.  7.   Abdominal aortic aneurysm: Felt to be small at the time of angiogram in January.  Blood pressure well controlled.  Continue beta-blocker.  8.  Carotid arterial disease: Overdue for follow-up ultrasound.  We will order today.  He thinks he will wait to schedule until he comes back for ABIs in December.  Continue statin therapy.  9.  History of bilateral lower extremity DVTs: On chronic warfarin.  10.  Stage II-III chronic kidney disease: Follow-up basic metabolic panel today in the setting of ongoing Lasix therapy.  11.  Disposition: Follow-up basic metabolic panel and magnesium today.  Follow-up 3-day ZIO monitor for PVC burden.  We will arrange for follow-up echo, ABIs, and carotid ultrasound.  Follow-up in clinic in approximately 3 months or sooner if necessary.  Murray Hodgkins, NP 01/17/2022, 11:06 AM

## 2022-01-19 ENCOUNTER — Telehealth: Payer: Self-pay | Admitting: *Deleted

## 2022-01-19 DIAGNOSIS — I5032 Chronic diastolic (congestive) heart failure: Secondary | ICD-10-CM

## 2022-01-19 DIAGNOSIS — I2510 Atherosclerotic heart disease of native coronary artery without angina pectoris: Secondary | ICD-10-CM

## 2022-01-19 DIAGNOSIS — I493 Ventricular premature depolarization: Secondary | ICD-10-CM

## 2022-01-19 DIAGNOSIS — N183 Chronic kidney disease, stage 3 unspecified: Secondary | ICD-10-CM

## 2022-01-19 MED ORDER — FUROSEMIDE 40 MG PO TABS: 20.0000 mg | ORAL_TABLET | ORAL | 0 refills | Status: DC

## 2022-01-19 NOTE — Telephone Encounter (Signed)
-----   Message from Theora Gianotti, NP sent at 01/17/2022  2:04 PM EDT ----- Slight worsening of kidney function over the last 2 months with creatinine rising to 1.69 on lasix '40mg'$  daily.  I'd like him to reduce lasix to '20mg'$  daily with an additional '20mg'$  to be taken as needed for weight gain of 3 pounds or greater in 24 hours, or 5 pounds over the course of the week.  Potassium and magnesium are normal and therefore do not explain increased burden of PVCs.  Follow-up basic metabolic panel in 2 to 3 weeks to reevaluate kidney function.

## 2022-01-19 NOTE — Telephone Encounter (Signed)
Reviewed results and recommendations with patient. He repeated all instructions back to me and reviewed where he would need to go for repeat labs. He verbalized understanding, agreeable with plan, and had no further questions at this time. He did request this information to be sent to his My Chart.

## 2022-01-25 DIAGNOSIS — L2089 Other atopic dermatitis: Secondary | ICD-10-CM | POA: Diagnosis not present

## 2022-01-27 ENCOUNTER — Ambulatory Visit (INDEPENDENT_AMBULATORY_CARE_PROVIDER_SITE_OTHER): Payer: Medicare Other

## 2022-01-27 DIAGNOSIS — Z7901 Long term (current) use of anticoagulants: Secondary | ICD-10-CM

## 2022-01-27 LAB — POCT INR: INR: 2 (ref 2.0–3.0)

## 2022-01-27 NOTE — Progress Notes (Signed)
Pt took last dose of doxycycline on 10/3.  Continue to take 1/2 tablet daily except take 1 tablet on Mondays, Wednesdays and Fridays.  Recheck in 3 weeks per pt request.

## 2022-01-27 NOTE — Patient Instructions (Signed)
Pt took last dose of doxycycline on 10/3.  Continue to take 1/2 tablet daily except take 1 tablet on Mondays, Wednesdays and Fridays.  Recheck in 3 weeks.

## 2022-01-28 DIAGNOSIS — I493 Ventricular premature depolarization: Secondary | ICD-10-CM | POA: Diagnosis not present

## 2022-02-06 ENCOUNTER — Emergency Department (HOSPITAL_COMMUNITY): Payer: Medicare Other

## 2022-02-06 ENCOUNTER — Observation Stay (HOSPITAL_COMMUNITY): Payer: Medicare Other

## 2022-02-06 ENCOUNTER — Other Ambulatory Visit: Payer: Self-pay

## 2022-02-06 ENCOUNTER — Emergency Department: Payer: Medicare Other

## 2022-02-06 ENCOUNTER — Encounter (HOSPITAL_COMMUNITY): Payer: Self-pay

## 2022-02-06 ENCOUNTER — Emergency Department
Admission: EM | Admit: 2022-02-06 | Discharge: 2022-02-06 | Disposition: A | Discharge status: Other Acute Inpatient Hospital | Payer: Medicare Other | Attending: Emergency Medicine | Admitting: Emergency Medicine

## 2022-02-06 ENCOUNTER — Inpatient Hospital Stay (HOSPITAL_COMMUNITY)
Admission: EM | Admit: 2022-02-06 | Discharge: 2022-02-12 | DRG: 199 | Disposition: A | Discharge status: Home Health | Payer: Medicare Other | Source: Other Acute Inpatient Hospital | Attending: Internal Medicine | Admitting: Internal Medicine

## 2022-02-06 DIAGNOSIS — Z79899 Other long term (current) drug therapy: Secondary | ICD-10-CM | POA: Diagnosis not present

## 2022-02-06 DIAGNOSIS — I700 Atherosclerosis of aorta: Secondary | ICD-10-CM | POA: Diagnosis not present

## 2022-02-06 DIAGNOSIS — I2510 Atherosclerotic heart disease of native coronary artery without angina pectoris: Secondary | ICD-10-CM | POA: Diagnosis not present

## 2022-02-06 DIAGNOSIS — W19XXXA Unspecified fall, initial encounter: Secondary | ICD-10-CM | POA: Insufficient documentation

## 2022-02-06 DIAGNOSIS — Z87891 Personal history of nicotine dependence: Secondary | ICD-10-CM

## 2022-02-06 DIAGNOSIS — T797XXA Traumatic subcutaneous emphysema, initial encounter: Secondary | ICD-10-CM

## 2022-02-06 DIAGNOSIS — J439 Emphysema, unspecified: Secondary | ICD-10-CM | POA: Diagnosis present

## 2022-02-06 DIAGNOSIS — J939 Pneumothorax, unspecified: Secondary | ICD-10-CM | POA: Diagnosis not present

## 2022-02-06 DIAGNOSIS — E039 Hypothyroidism, unspecified: Secondary | ICD-10-CM | POA: Insufficient documentation

## 2022-02-06 DIAGNOSIS — J986 Disorders of diaphragm: Secondary | ICD-10-CM

## 2022-02-06 DIAGNOSIS — Z7901 Long term (current) use of anticoagulants: Secondary | ICD-10-CM

## 2022-02-06 DIAGNOSIS — I480 Paroxysmal atrial fibrillation: Secondary | ICD-10-CM | POA: Diagnosis present

## 2022-02-06 DIAGNOSIS — I714 Abdominal aortic aneurysm, without rupture, unspecified: Secondary | ICD-10-CM | POA: Diagnosis present

## 2022-02-06 DIAGNOSIS — S299XXA Unspecified injury of thorax, initial encounter: Secondary | ICD-10-CM | POA: Diagnosis present

## 2022-02-06 DIAGNOSIS — Z86718 Personal history of other venous thrombosis and embolism: Secondary | ICD-10-CM

## 2022-02-06 DIAGNOSIS — S0990XA Unspecified injury of head, initial encounter: Secondary | ICD-10-CM | POA: Diagnosis not present

## 2022-02-06 DIAGNOSIS — I7143 Infrarenal abdominal aortic aneurysm, without rupture: Secondary | ICD-10-CM | POA: Diagnosis not present

## 2022-02-06 DIAGNOSIS — I10 Essential (primary) hypertension: Secondary | ICD-10-CM | POA: Diagnosis not present

## 2022-02-06 DIAGNOSIS — J449 Chronic obstructive pulmonary disease, unspecified: Secondary | ICD-10-CM | POA: Diagnosis not present

## 2022-02-06 DIAGNOSIS — J431 Panlobular emphysema: Secondary | ICD-10-CM | POA: Diagnosis not present

## 2022-02-06 DIAGNOSIS — T8182XA Emphysema (subcutaneous) resulting from a procedure, initial encounter: Secondary | ICD-10-CM | POA: Diagnosis not present

## 2022-02-06 DIAGNOSIS — R9431 Abnormal electrocardiogram [ECG] [EKG]: Secondary | ICD-10-CM | POA: Diagnosis not present

## 2022-02-06 DIAGNOSIS — J9600 Acute respiratory failure, unspecified whether with hypoxia or hypercapnia: Secondary | ICD-10-CM

## 2022-02-06 DIAGNOSIS — I350 Nonrheumatic aortic (valve) stenosis: Secondary | ICD-10-CM | POA: Diagnosis present

## 2022-02-06 DIAGNOSIS — S2241XA Multiple fractures of ribs, right side, initial encounter for closed fracture: Secondary | ICD-10-CM | POA: Diagnosis present

## 2022-02-06 DIAGNOSIS — I130 Hypertensive heart and chronic kidney disease with heart failure and stage 1 through stage 4 chronic kidney disease, or unspecified chronic kidney disease: Secondary | ICD-10-CM | POA: Diagnosis not present

## 2022-02-06 DIAGNOSIS — J9811 Atelectasis: Secondary | ICD-10-CM

## 2022-02-06 DIAGNOSIS — Z9049 Acquired absence of other specified parts of digestive tract: Secondary | ICD-10-CM

## 2022-02-06 DIAGNOSIS — Z23 Encounter for immunization: Secondary | ICD-10-CM | POA: Diagnosis not present

## 2022-02-06 DIAGNOSIS — I129 Hypertensive chronic kidney disease with stage 1 through stage 4 chronic kidney disease, or unspecified chronic kidney disease: Secondary | ICD-10-CM | POA: Insufficient documentation

## 2022-02-06 DIAGNOSIS — W1809XA Striking against other object with subsequent fall, initial encounter: Secondary | ICD-10-CM | POA: Diagnosis present

## 2022-02-06 DIAGNOSIS — Z7902 Long term (current) use of antithrombotics/antiplatelets: Secondary | ICD-10-CM

## 2022-02-06 DIAGNOSIS — Z8679 Personal history of other diseases of the circulatory system: Secondary | ICD-10-CM

## 2022-02-06 DIAGNOSIS — I5032 Chronic diastolic (congestive) heart failure: Secondary | ICD-10-CM | POA: Diagnosis not present

## 2022-02-06 DIAGNOSIS — R0902 Hypoxemia: Secondary | ICD-10-CM | POA: Diagnosis not present

## 2022-02-06 DIAGNOSIS — I739 Peripheral vascular disease, unspecified: Secondary | ICD-10-CM | POA: Diagnosis not present

## 2022-02-06 DIAGNOSIS — N183 Chronic kidney disease, stage 3 unspecified: Secondary | ICD-10-CM | POA: Insufficient documentation

## 2022-02-06 DIAGNOSIS — S27321D Contusion of lung, unilateral, subsequent encounter: Secondary | ICD-10-CM | POA: Diagnosis not present

## 2022-02-06 DIAGNOSIS — I255 Ischemic cardiomyopathy: Secondary | ICD-10-CM | POA: Diagnosis not present

## 2022-02-06 DIAGNOSIS — Z79890 Hormone replacement therapy (postmenopausal): Secondary | ICD-10-CM | POA: Diagnosis not present

## 2022-02-06 DIAGNOSIS — N179 Acute kidney failure, unspecified: Secondary | ICD-10-CM | POA: Diagnosis present

## 2022-02-06 DIAGNOSIS — I82409 Acute embolism and thrombosis of unspecified deep veins of unspecified lower extremity: Secondary | ICD-10-CM | POA: Diagnosis present

## 2022-02-06 DIAGNOSIS — J9601 Acute respiratory failure with hypoxia: Secondary | ICD-10-CM | POA: Diagnosis present

## 2022-02-06 DIAGNOSIS — S0081XA Abrasion of other part of head, initial encounter: Secondary | ICD-10-CM | POA: Diagnosis not present

## 2022-02-06 DIAGNOSIS — J811 Chronic pulmonary edema: Secondary | ICD-10-CM

## 2022-02-06 DIAGNOSIS — S27321A Contusion of lung, unilateral, initial encounter: Secondary | ICD-10-CM

## 2022-02-06 DIAGNOSIS — I959 Hypotension, unspecified: Secondary | ICD-10-CM | POA: Diagnosis present

## 2022-02-06 DIAGNOSIS — S270XXA Traumatic pneumothorax, initial encounter: Secondary | ICD-10-CM | POA: Diagnosis not present

## 2022-02-06 DIAGNOSIS — N1831 Chronic kidney disease, stage 3a: Secondary | ICD-10-CM | POA: Diagnosis present

## 2022-02-06 DIAGNOSIS — S2249XA Multiple fractures of ribs, unspecified side, initial encounter for closed fracture: Secondary | ICD-10-CM

## 2022-02-06 DIAGNOSIS — R0781 Pleurodynia: Secondary | ICD-10-CM | POA: Diagnosis not present

## 2022-02-06 DIAGNOSIS — K4090 Unilateral inguinal hernia, without obstruction or gangrene, not specified as recurrent: Secondary | ICD-10-CM | POA: Diagnosis not present

## 2022-02-06 DIAGNOSIS — I872 Venous insufficiency (chronic) (peripheral): Secondary | ICD-10-CM | POA: Diagnosis present

## 2022-02-06 DIAGNOSIS — Z951 Presence of aortocoronary bypass graft: Secondary | ICD-10-CM

## 2022-02-06 DIAGNOSIS — E7800 Pure hypercholesterolemia, unspecified: Secondary | ICD-10-CM | POA: Diagnosis present

## 2022-02-06 DIAGNOSIS — S270XXD Traumatic pneumothorax, subsequent encounter: Secondary | ICD-10-CM | POA: Diagnosis not present

## 2022-02-06 DIAGNOSIS — Z888 Allergy status to other drugs, medicaments and biological substances status: Secondary | ICD-10-CM

## 2022-02-06 DIAGNOSIS — Z95820 Peripheral vascular angioplasty status with implants and grafts: Secondary | ICD-10-CM

## 2022-02-06 DIAGNOSIS — J80 Acute respiratory distress syndrome: Secondary | ICD-10-CM

## 2022-02-06 DIAGNOSIS — R110 Nausea: Secondary | ICD-10-CM | POA: Diagnosis not present

## 2022-02-06 LAB — PROTIME-INR
INR: 2 — ABNORMAL HIGH (ref 0.8–1.2)
Prothrombin Time: 22.6 seconds — ABNORMAL HIGH (ref 11.4–15.2)

## 2022-02-06 LAB — CBC WITH DIFFERENTIAL/PLATELET
Abs Immature Granulocytes: 0.08 10*3/uL — ABNORMAL HIGH (ref 0.00–0.07)
Basophils Absolute: 0.1 10*3/uL (ref 0.0–0.1)
Basophils Relative: 1 %
Eosinophils Absolute: 0.2 10*3/uL (ref 0.0–0.5)
Eosinophils Relative: 2 %
HCT: 47.2 % (ref 39.0–52.0)
Hemoglobin: 14.6 g/dL (ref 13.0–17.0)
Immature Granulocytes: 1 %
Lymphocytes Relative: 14 %
Lymphs Abs: 1.5 10*3/uL (ref 0.7–4.0)
MCH: 28 pg (ref 26.0–34.0)
MCHC: 30.9 g/dL (ref 30.0–36.0)
MCV: 90.6 fL (ref 80.0–100.0)
Monocytes Absolute: 0.6 10*3/uL (ref 0.1–1.0)
Monocytes Relative: 6 %
Neutro Abs: 7.9 10*3/uL — ABNORMAL HIGH (ref 1.7–7.7)
Neutrophils Relative %: 76 %
Platelets: 237 10*3/uL (ref 150–400)
RBC: 5.21 MIL/uL (ref 4.22–5.81)
RDW: 14.8 % (ref 11.5–15.5)
WBC: 10.3 10*3/uL (ref 4.0–10.5)
nRBC: 0 % (ref 0.0–0.2)

## 2022-02-06 LAB — COMPREHENSIVE METABOLIC PANEL
ALT: 12 U/L (ref 0–44)
AST: 18 U/L (ref 15–41)
Albumin: 4 g/dL (ref 3.5–5.0)
Alkaline Phosphatase: 94 U/L (ref 38–126)
Anion gap: 7 (ref 5–15)
BUN: 25 mg/dL — ABNORMAL HIGH (ref 8–23)
CO2: 26 mmol/L (ref 22–32)
Calcium: 8.8 mg/dL — ABNORMAL LOW (ref 8.9–10.3)
Chloride: 105 mmol/L (ref 98–111)
Creatinine, Ser: 1.51 mg/dL — ABNORMAL HIGH (ref 0.61–1.24)
GFR, Estimated: 46 mL/min — ABNORMAL LOW (ref >60)
Glucose, Bld: 107 mg/dL — ABNORMAL HIGH (ref 70–99)
Potassium: 4.6 mmol/L (ref 3.5–5.1)
Sodium: 138 mmol/L (ref 135–145)
Total Bilirubin: 1 mg/dL (ref 0.3–1.2)
Total Protein: 7.2 g/dL (ref 6.5–8.1)

## 2022-02-06 LAB — LIPASE, BLOOD: Lipase: 55 U/L — ABNORMAL HIGH (ref 11–51)

## 2022-02-06 MED ORDER — OXYCODONE-ACETAMINOPHEN 5-325 MG PO TABS
1.0000 | ORAL_TABLET | Freq: Once | ORAL | Status: AC
  Administered 2022-02-06: 1 via ORAL
  Filled 2022-02-06: qty 1

## 2022-02-06 MED ORDER — OXYCODONE HCL 5 MG PO TABS: 5.0000 mg | ORAL_TABLET | ORAL | Status: DC | PRN

## 2022-02-06 MED ORDER — ACETAMINOPHEN 325 MG PO TABS
650.0000 mg | ORAL_TABLET | Freq: Four times a day (QID) | ORAL | Status: DC | PRN
  Administered 2022-02-07 – 2022-02-10 (×2): 650 mg via ORAL
  Filled 2022-02-06 (×3): qty 2

## 2022-02-06 MED ORDER — WARFARIN SODIUM 2.5 MG PO TABS: 2.5000 mg | ORAL_TABLET | Freq: Once | ORAL | Status: DC

## 2022-02-06 MED ORDER — TETANUS-DIPHTH-ACELL PERTUSSIS 5-2.5-18.5 LF-MCG/0.5 IM SUSY
0.5000 mL | PREFILLED_SYRINGE | Freq: Once | INTRAMUSCULAR | Status: AC
  Administered 2022-02-06: 0.5 mL via INTRAMUSCULAR
  Filled 2022-02-06: qty 0.5

## 2022-02-06 MED ORDER — FUROSEMIDE 20 MG PO TABS
20.0000 mg | ORAL_TABLET | Freq: Every day | ORAL | Status: DC | PRN
  Administered 2022-02-07: 20 mg via ORAL

## 2022-02-06 MED ORDER — LEVOTHYROXINE SODIUM 75 MCG PO TABS
150.0000 ug | ORAL_TABLET | Freq: Every day | ORAL | Status: DC
  Administered 2022-02-07 – 2022-02-12 (×6): 150 ug via ORAL
  Filled 2022-02-06 (×6): qty 2

## 2022-02-06 MED ORDER — MIDAZOLAM HCL 2 MG/2ML IJ SOLN
INTRAMUSCULAR | Status: AC | PRN
  Administered 2022-02-06: .5 mg via INTRAVENOUS
  Administered 2022-02-06: 1 mg via INTRAVENOUS

## 2022-02-06 MED ORDER — ONDANSETRON HCL 4 MG/2ML IJ SOLN
4.0000 mg | Freq: Once | INTRAMUSCULAR | Status: AC
  Administered 2022-02-06: 4 mg via INTRAVENOUS
  Filled 2022-02-06: qty 2

## 2022-02-06 MED ORDER — IOHEXOL 300 MG/ML  SOLN
100.0000 mL | Freq: Once | INTRAMUSCULAR | Status: AC | PRN
  Administered 2022-02-06: 100 mL via INTRAVENOUS

## 2022-02-06 MED ORDER — MIDAZOLAM HCL 2 MG/2ML IJ SOLN
INTRAMUSCULAR | Status: AC
  Filled 2022-02-06: qty 2

## 2022-02-06 MED ORDER — ACETAMINOPHEN 650 MG RE SUPP: 650.0000 mg | Freq: Four times a day (QID) | RECTAL | Status: DC | PRN

## 2022-02-06 MED ORDER — TRIAMCINOLONE ACETONIDE 0.1 % EX OINT: 1.0000 | TOPICAL_OINTMENT | Freq: Every day | CUTANEOUS | Status: DC | PRN

## 2022-02-06 MED ORDER — FENTANYL CITRATE (PF) 100 MCG/2ML IJ SOLN
INTRAMUSCULAR | Status: AC
  Filled 2022-02-06: qty 2

## 2022-02-06 MED ORDER — METHOCARBAMOL 1000 MG/10ML IJ SOLN: 500.0000 mg | Freq: Four times a day (QID) | INTRAVENOUS | Status: DC | PRN

## 2022-02-06 MED ORDER — SODIUM CHLORIDE 0.9 % IV SOLN: INTRAVENOUS | Status: DC

## 2022-02-06 MED ORDER — ATORVASTATIN CALCIUM 10 MG PO TABS
20.0000 mg | ORAL_TABLET | Freq: Every day | ORAL | Status: DC
  Administered 2022-02-07 – 2022-02-12 (×6): 20 mg via ORAL
  Filled 2022-02-06 (×6): qty 2

## 2022-02-06 MED ORDER — WARFARIN - PHARMACIST DOSING INPATIENT: Freq: Every day | Status: DC

## 2022-02-06 MED ORDER — ONDANSETRON HCL 4 MG/2ML IJ SOLN: 4.0000 mg | Freq: Four times a day (QID) | INTRAMUSCULAR | Status: DC | PRN

## 2022-02-06 MED ORDER — FENTANYL CITRATE (PF) 100 MCG/2ML IJ SOLN
INTRAMUSCULAR | Status: AC | PRN
  Administered 2022-02-06 (×2): 25 ug via INTRAVENOUS

## 2022-02-06 MED ORDER — CARVEDILOL 6.25 MG PO TABS
6.2500 mg | ORAL_TABLET | Freq: Two times a day (BID) | ORAL | Status: DC
  Administered 2022-02-07 – 2022-02-12 (×11): 6.25 mg via ORAL
  Filled 2022-02-06 (×7): qty 1
  Filled 2022-02-06: qty 2
  Filled 2022-02-06 (×4): qty 1

## 2022-02-06 MED ORDER — HYDROMORPHONE HCL 1 MG/ML IJ SOLN
0.5000 mg | Freq: Once | INTRAMUSCULAR | Status: AC
  Administered 2022-02-06: 0.5 mg via INTRAVENOUS
  Filled 2022-02-06: qty 0.5

## 2022-02-06 MED ORDER — FUROSEMIDE 20 MG PO TABS
20.0000 mg | ORAL_TABLET | Freq: Every day | ORAL | Status: DC
  Filled 2022-02-06: qty 1

## 2022-02-06 MED ORDER — FUROSEMIDE 20 MG PO TABS: 20.0000 mg | ORAL_TABLET | ORAL | Status: DC

## 2022-02-06 MED ORDER — AMLODIPINE BESYLATE 5 MG PO TABS
5.0000 mg | ORAL_TABLET | Freq: Every day | ORAL | Status: DC
  Administered 2022-02-07 – 2022-02-08 (×2): 5 mg via ORAL
  Filled 2022-02-06 (×2): qty 1

## 2022-02-06 MED ORDER — MORPHINE SULFATE (PF) 2 MG/ML IV SOLN
2.0000 mg | INTRAVENOUS | Status: DC | PRN
  Administered 2022-02-06 – 2022-02-07 (×8): 2 mg via INTRAVENOUS
  Filled 2022-02-06 (×8): qty 1

## 2022-02-06 MED ORDER — ONDANSETRON HCL 4 MG PO TABS: 4.0000 mg | ORAL_TABLET | Freq: Four times a day (QID) | ORAL | Status: DC | PRN

## 2022-02-06 MED ORDER — CILOSTAZOL 50 MG PO TABS
50.0000 mg | ORAL_TABLET | Freq: Two times a day (BID) | ORAL | Status: DC
  Administered 2022-02-07 – 2022-02-12 (×11): 50 mg via ORAL
  Filled 2022-02-06 (×14): qty 1

## 2022-02-06 NOTE — Sedation Documentation (Signed)
50 mcg of fentanyl and 0.5 mg Versed wasted in sharps box with Dr. Serafina Royals

## 2022-02-06 NOTE — ED Triage Notes (Signed)
Pt in via EMS from home with c/o witnessed mechanical fall and hit his head. No LOC but pt is on blood thinners. Pt also c/o right rib pain, left middle finger pain and abrasion above eye.

## 2022-02-06 NOTE — ED Provider Notes (Signed)
[facility] EMERGENCY DEPARTMENT Provider Note   CSN: [id number] Arrival date & time: 02/06/22  1451     History  Chief Complaint  Patient presents with   transfer-trauma   Lavada Mesi is a 81 y.o. male.  HPI 81 year old male with multiple comorbidities including DVT, CHF, AAA, CAD and CABG, hypertension, and other medical diseases presents as a transfer from [place] after a fall and rib fractures.  He tripped over a cord and landed with his arm pinned against his chest on the right side.  He was found to have rib fractures with a small pneumothorax and subcutaneous emphysema.  Further scans were overall unremarkable.  He is on warfarin and his INR is 2.0.  He was transferred here with no chest tube and trauma surgery was to see him in the emergency department here.  Right now he states that with the pain medicine he has been given he has no significant pain.  However since getting a CT of his abdomen after the original CTs and laying down and then sitting up he has noticed diffuse swelling, first in the right periorbital area and now through his whole face and neck.  He denies any significant trouble breathing.  Home Medications Prior to Admission medications   Medication Sig Start Date End Date Taking? Authorizing Provider  amLODipine (NORVASC) 5 MG tablet TAKE 1 TABLET BY MOUTH EVERY DAY 01/12/22   Wellington Hampshire, MD  atorvastatin (LIPITOR) 20 MG tablet TAKE 1 TABLET BY MOUTH EVERY DAY 12/24/21   Wellington Hampshire, MD  carvedilol (COREG) 6.25 MG tablet TAKE 1 TABLET BY MOUTH TWICE A DAY WITH MEALS 08/03/21   Copland, Frederico Hamman, MD  cilostazol (PLETAL) 50 MG tablet TAKE 1 TABLET BY MOUTH TWICE A DAY 08/03/21   Wellington Hampshire, MD  doxycycline (VIBRAMYCIN) 100 MG capsule Take 100 mg by mouth 2 (two) times daily. 10/20/21   [provider]  furosemide (LASIX) 40 MG tablet Take 0.5 tablets (20 mg total) by mouth as directed. Take 20 mg once daily with  extra 20 mg as needed for weight gain of 3 pounds overnight or 5 pounds in one week 01/19/22   Theora Gianotti, NP  levothyroxine (SYNTHROID) 150 MCG tablet TAKE ONE TABLET BY MOUTH EVERY DAY BEFORE BREAKFAST 08/03/21   Copland, Frederico Hamman, MD  mupirocin ointment (BACTROBAN) 2 % SMARTSIG:sparingly Both Nares Twice Daily 10/20/21   [provider]  triamcinolone ointment (KENALOG) 0.1 % APPLY TOPICALLY TO AFFECTED AREAS TWICE A DAY FOR 14 DAYS THEN MAY APPLY 5 DAYS WEEKLY TO AFFECTED AREAS 12/21/21   Ralene Bathe, MD  warfarin (COUMADIN) 5 MG tablet Take 0.5-1 tablets (2.5-5 mg total) by mouth See admin instructions. TAKE 1/2 TABLET DAILY BY MOUTH EXCEPT TAKE 1 TABLET ON MON, WED, FRI OR AS DIRECTED ANTICOAGULATION CLINIC 03/12/21   Copland, Frederico Hamman, MD      Allergies    Plavix [clopidogrel bisulfate]    Review of Systems   Review of Systems  Respiratory:  Negative for shortness of breath.   Cardiovascular:  Negative for chest pain.  Gastrointestinal:  Negative for abdominal pain.    Physical Exam Updated Vital Signs BP (!) 112/96   Pulse 70   Temp (!) 97.5 F (36.4 C) (Oral)   Resp 19   Ht '5\' 9"'$  (1.753 m)   Wt 73.9 kg   SpO2 98%   BMI 24.07 kg/m  Physical Exam Vitals and nursing note  reviewed.  Constitutional:      Appearance: He is well-developed. He is not diaphoretic.  HENT:     Head: Normocephalic and atraumatic.  Cardiovascular:     Rate and Rhythm: Normal rate and regular rhythm.     Heart sounds: Normal heart sounds.  Pulmonary:     Effort: Pulmonary effort is normal.     Comments: Mildly decreased BS over both sides of anterior chest with some crackling c/w subcutaneous emphysema Abdominal:     Palpations: Abdomen is soft.     Tenderness: There is no abdominal tenderness.  Skin:    General: Skin is warm and dry.     Comments: Patient has diffuse subcutaneous emphysema including his anterior neck, face, right chest and right forearm.   Neurological:     Mental Status: He is alert.     ED Results / Procedures / Treatments   Labs (all labs ordered are listed, but only abnormal results are displayed) Labs Reviewed - No data to display  EKG None  Radiology DG Chest Portable 1 View  Result Date: 02/06/2022 CLINICAL DATA:  Pneumothorax EXAM: PORTABLE CHEST 1 VIEW COMPARISON:  Chest x-ray 02/06/2022.  CT of the chest 02/06/2022. FINDINGS: Small right pneumothorax is mildly increased from the prior x-ray no measuring 2 cm the lung apex. There is no mediastinal shift. Extensive chest wall emphysema is present in has increased now extending over the left chest and neck. There is no pleural effusion or lung consolidation. There is minimal bibasilar atelectasis, unchanged. Cardiomediastinal silhouette is within normal limits. Patient is status post cardiac surgery. Osseous structures are unchanged. IMPRESSION: 1. Small right pneumothorax is mildly increased from the prior x-ray. 2. Extensive chest wall emphysema has increased now extending over the left chest and neck. Electronically Signed   By: Ronney Asters M.D.   On: 02/06/2022 15:25   CT ABDOMEN PELVIS W CONTRAST  Result Date: 02/06/2022 CLINICAL DATA:  Fall.  Patient fell onto a hardwood floor. EXAM: CT ABDOMEN AND PELVIS WITH CONTRAST TECHNIQUE: Multidetector CT imaging of the abdomen and pelvis was performed using the standard protocol following bolus administration of intravenous contrast. RADIATION DOSE REDUCTION: This exam was performed according to the departmental dose-optimization program which includes automated exposure control, adjustment of the mA and/or kV according to patient size and/or use of iterative reconstruction technique. CONTRAST:  180m OMNIPAQUE IOHEXOL 300 MG/ML  SOLN COMPARISON:  Current chest CT. FINDINGS: Lower chest: Right pneumothorax. Dependent right lung atelectasis. Extensive right chest subcutaneous emphysema that extends to the right abdomen,  as described under the current chest CT. Hepatobiliary: Liver normal in size and attenuation. No laceration contusion. No mass. Status post cholecystectomy. No bile duct dilation. Pancreas: Normal. Spleen: No contusion or laceration.  Normal in size.  No mass. Adrenals/Urinary Tract: No adrenal mass or hemorrhage. Small left kidney with diffuse cortical thinning. Two right renal cysts, largest extending from the anterior midpole, 8.2 cm in size. Tiny low-attenuation lesions along the lower pole right kidney, also consistent with cysts. No follow-up recommended. No stones. No hydronephrosis. Normal ureters. Normal bladder. Stomach/Bowel: No stomach, bowel or mesenteric injury. Stomach is unremarkable. Small bowel and colon are normal in caliber. No wall thickening or inflammation. Scattered left colon diverticula. Portion of the colon, at the junction of the descending and sigmoid colon, enters a left inguinal hernia without evidence obstruction, incarceration or strangulation. Vascular/Lymphatic: Irregular, overall fusiform, infrarenal abdominal aortic aneurysm, 4.5 cm anterior-posterior, 9 cm in length. No aneurysm rupture. No dissection.  Well-positioned inferior vena cava filter. No enlarged lymph nodes. Reproductive: Mild prostate enlargement, 4.6 x 4.4 cm. Other: No ascites or hemoperitoneum. Musculoskeletal: Right rib fractures as described under the current chest CT. No other fractures.  No bone lesions. IMPRESSION: 1. No acute findings within the abdomen or pelvis. Specifically, no evidence injury the abdomen or pelvis. 2. Right pneumothorax, dependent right lung atelectasis and large amount right chest wall and right abdominal subcutaneous emphysema, associated with right rib fractures, as described under the current chest CT. 3. 4.5 cm infrarenal abdominal aortic aneurysm. No evidence of rupture. Recommend follow-up CT/MR every 6 months and vascular consultation. This recommendation follows ACR consensus  guidelines: White Paper of the ACR Incidental Findings Committee II on Vascular Findings. J Am Coll Radiol 2013; 10:789-794. 4. Left inguinal hernia containing a small portion of the colon at the junction of the descending and sigmoid, without evidence of obstruction, incarceration or strangulation. Electronically Signed   By: Lajean Manes M.D.   On: 02/06/2022 14:12   DG Chest Portable 1 View  Result Date: 02/06/2022 CLINICAL DATA:  Fall with chest pain. EXAM: PORTABLE CHEST 1 VIEW COMPARISON:  11/09/2021 FINDINGS: A small RIGHT apical pneumothorax is noted (less than 10%. Extensive RIGHT subcutaneous emphysema is present. A fracture of the RIGHT 5th rib is present. The cardiomediastinal silhouette is unremarkable. CABG changes and LEFT subclavian stent again noted. The RIGHT lung is clear. No pleural effusion identified. IMPRESSION: 1. Small RIGHT apical pneumothorax (less than 10%) with extensive RIGHT subcutaneous emphysema. 2. RIGHT 5th rib fracture. Critical Value/emergent results were called by telephone at the time of interpretation on 02/06/2022 at 1:39 pm to provider [facility] , who verbally acknowledged these results. Electronically Signed   By: Margarette Canada M.D.   On: 02/06/2022 13:40   CT CHEST WO CONTRAST  Result Date: 02/06/2022 CLINICAL DATA:  Fall on right side of chest EXAM: CT CHEST WITHOUT CONTRAST TECHNIQUE: Multidetector CT imaging of the chest was performed following the standard protocol without IV contrast. RADIATION DOSE REDUCTION: This exam was performed according to the departmental dose-optimization program which includes automated exposure control, adjustment of the mA and/or kV according to patient size and/or use of iterative reconstruction technique. COMPARISON:  11/09/2021 FINDINGS: Cardiovascular: Aortic atherosclerosis. Dense aortic valve calcifications. Normal heart size. Three-vessel coronary artery calcifications status post median sternotomy and CABG. No  pericardial effusion. Mediastinum/Nodes: No enlarged mediastinal, hilar, or axillary lymph nodes. Thyroid gland, trachea, and esophagus demonstrate no significant findings. Lungs/Pleura: Moderate to severe centrilobular emphysema. Diffuse bilateral bronchial wall thickening. Small volume, approximately 10% right pneumothorax. Upper Abdomen: No acute abnormality. Musculoskeletal: Nondisplaced fractures of the lateral right fifth and posterior right seventh and eighth ribs. Subcutaneous emphysema about the right chest wall. Disc degenerative disease and bridging osteophytosis throughout the thoracic spine, in keeping with DISH. IMPRESSION: 1. Small volume, approximately 10% right pneumothorax. 2. Nondisplaced fractures of the overlying lateral right fifth and posterior right seventh and eighth ribs. 3. Subcutaneous emphysema about the right chest wall. 4. Emphysema and diffuse bilateral bronchial wall thickening. 5. Coronary artery disease. 6. Dense aortic valve calcifications. Correlate for echocardiographic evidence of aortic valve dysfunction when clinically appropriate. These results were called by telephone at the time of interpretation on 02/06/2022 at 12:38 pm to Dr. Jori Moll, who verbally acknowledged these results. Aortic Atherosclerosis (ICD10-I70.0) and Emphysema (ICD10-J43.9). Electronically Signed   By: Delanna Ahmadi M.D.   On: 02/06/2022 12:46   CT Head Wo Contrast  Result Date: 02/06/2022 CLINICAL  DATA:  Patient fell onto the right side.  Chest wall pain. EXAM: CT HEAD WITHOUT CONTRAST CT CERVICAL SPINE WITHOUT CONTRAST TECHNIQUE: Multidetector CT imaging of the head and cervical spine was performed following the standard protocol without intravenous contrast. Multiplanar CT image reconstructions of the cervical spine were also generated. RADIATION DOSE REDUCTION: This exam was performed according to the departmental dose-optimization program which includes automated exposure control, adjustment of  the mA and/or kV according to patient size and/or use of iterative reconstruction technique. COMPARISON:  12/22/2016. FINDINGS: CT HEAD FINDINGS Brain: No evidence of acute infarction, hemorrhage, hydrocephalus, extra-axial collection or mass lesion/mass effect. Small old deep white matter lacunar infarcts, right frontal lobe. Mild chronic microvascular ischemic change. Small old infarct suggested in the inferior right cerebellum. These findings are stable. Vascular: No hyperdense vessel or unexpected calcification. Skull: Normal. Negative for fracture or focal lesion. Sinuses/Orbits: Globes and orbits are unremarkable. Sinuses are clear. Other: None. CT CERVICAL SPINE FINDINGS Alignment: Mild kyphosis, apex at C3-C4.  No spondylolisthesis. Skull base and vertebrae: No acute fracture. No primary bone lesion or focal pathologic process. Soft tissues and spinal canal: No prevertebral fluid or swelling. No visible canal hematoma. Disc levels: Mild loss of disc height at C2-C3. Moderate loss of disc height from C3-C4 through C6-C7. Disc bulging and endplate spurring noted at these levels. Facet degenerative changes, most evident on the right at C3-C4 and on the left at C2-C3. No convincing disc herniation. Upper chest: Subcutaneous air at the right neck base. Please refer to the patient's current CT chest for further details. Other: None. IMPRESSION: HEAD CT 1. No acute intracranial abnormalities. CERVICAL CT 1. No fracture. 2. Right neck base soft tissue air. Refer to the current chest CT for further details. Electronically Signed   By: Lajean Manes M.D.   On: 02/06/2022 12:39   CT Cervical Spine Wo Contrast  Result Date: 02/06/2022 CLINICAL DATA:  Patient fell onto the right side.  Chest wall pain. EXAM: CT HEAD WITHOUT CONTRAST CT CERVICAL SPINE WITHOUT CONTRAST TECHNIQUE: Multidetector CT imaging of the head and cervical spine was performed following the standard protocol without intravenous contrast.  Multiplanar CT image reconstructions of the cervical spine were also generated. RADIATION DOSE REDUCTION: This exam was performed according to the departmental dose-optimization program which includes automated exposure control, adjustment of the mA and/or kV according to patient size and/or use of iterative reconstruction technique. COMPARISON:  12/22/2016. FINDINGS: CT HEAD FINDINGS Brain: No evidence of acute infarction, hemorrhage, hydrocephalus, extra-axial collection or mass lesion/mass effect. Small old deep white matter lacunar infarcts, right frontal lobe. Mild chronic microvascular ischemic change. Small old infarct suggested in the inferior right cerebellum. These findings are stable. Vascular: No hyperdense vessel or unexpected calcification. Skull: Normal. Negative for fracture or focal lesion. Sinuses/Orbits: Globes and orbits are unremarkable. Sinuses are clear. Other: None. CT CERVICAL SPINE FINDINGS Alignment: Mild kyphosis, apex at C3-C4.  No spondylolisthesis. Skull base and vertebrae: No acute fracture. No primary bone lesion or focal pathologic process. Soft tissues and spinal canal: No prevertebral fluid or swelling. No visible canal hematoma. Disc levels: Mild loss of disc height at C2-C3. Moderate loss of disc height from C3-C4 through C6-C7. Disc bulging and endplate spurring noted at these levels. Facet degenerative changes, most evident on the right at C3-C4 and on the left at C2-C3. No convincing disc herniation. Upper chest: Subcutaneous air at the right neck base. Please refer to the patient's current CT chest for further details. Other:  None. IMPRESSION: HEAD CT 1. No acute intracranial abnormalities. CERVICAL CT 1. No fracture. 2. Right neck base soft tissue air. Refer to the current chest CT for further details. Electronically Signed   By: Lajean Manes M.D.   On: 02/06/2022 12:39    Procedures Procedures    Medications Ordered in ED Medications  ondansetron (ZOFRAN)  injection 4 mg (4 mg Intravenous Given 02/06/22 1612)    ED Course/ Medical Decision Making/ A&P                           Medical Decision Making Amount and/or Complexity of Data Reviewed Independent Historian: spouse External Data Reviewed: notes. Radiology: ordered and independent interpretation performed.    Details: Extensive subcutaneous emphysema with small pneumothorax  Risk Prescription drug management. Decision regarding hospitalization.   I discussed case with Dr. Dema Severin.  He recommends medical admission given the extensive comorbidities which are noted above.  Also he notes that when he talk to CT surgery about this case, patient likely has a lung that will not be compressed due to his prior CABG which is resulting in the subcutaneous emphysema as his lung is continuing to be injured and causing air leakage.  He has arranged for CT-guided chest tube today.  Asked for medical admission and I have discussed case with Dr. Rogers Blocker who will admit.  He is otherwise stable.        Final Clinical Impression(s) / ED Diagnoses Final diagnoses:  Traumatic pneumothorax, initial encounter  Subcutaneous emphysema, initial encounter [facility])    Rx / DC Orders ED Discharge Orders     None         Sherwood Gambler, MD 02/06/22 1623

## 2022-02-06 NOTE — Assessment & Plan Note (Signed)
Well controlled, continue norvasc '5mg'$  daily, coreg 6.'25mg'$  BID

## 2022-02-06 NOTE — Assessment & Plan Note (Addendum)
No s/sx of exacerbation  Continue SABA prn, on no daily therapy

## 2022-02-06 NOTE — ED Triage Notes (Addendum)
Pt coming from [place] for trauma consult, fall this AM, -LOC, on Coumadin, sub q emphysema, right rib fractures and pneumothorax, swelling to neck and right eye noted, laceration to right eyebrow, abrasion to right wrist, pt has received Dilaudid, Oxycodone, Zofran, and Tetanus

## 2022-02-06 NOTE — Procedures (Signed)
Interventional Radiology Procedure Note  Procedure: CT guided right thoracostomy tube placement  Findings: Please refer to procedural dictation for full description. Right mid-axillary 14 Fr pigtail thoracostomy to pleurevac.  Complications: None immediate  Estimated Blood Loss: < 5 mL  Recommendations: Keep to wall suction. Recommend morning CXR to assess resolution of pneumothorax. IR will follow.   Ruthann Cancer, MD Pager: [phone]

## 2022-02-06 NOTE — Assessment & Plan Note (Signed)
Continue lipitor '20mg'$  daily

## 2022-02-06 NOTE — Progress Notes (Signed)
ANTICOAGULATION CONSULT NOTE - Initial Consult  Pharmacy Consult for warfarin Indication: Hx VTE/AF  Allergies  Allergen Reactions   Plavix [Clopidogrel Bisulfate]     Brain hemorrhage prev while on plavix and aspirin    Patient Measurements: Height: '5\' 9"'$  (175.3 cm) Weight: 73.9 kg (162 lb 15.8 oz) IBW/kg (Calculated) : 70.7   Vital Signs: Temp: 97.5 F (36.4 C) (10/15 1506) Temp Source: Oral (10/15 1506) BP: 137/69 (10/15 1630) Pulse Rate: 77 (10/15 1630)  Labs: Recent Labs    02/06/22 1259  HGB 14.6  HCT 47.2  PLT 237  LABPROT 22.6*  INR 2.0*  CREATININE 1.51*    Estimated Creatinine Clearance: 39 mL/min (A) (by C-G formula based on SCr of 1.51 mg/dL (H)).   Medical History: Past Medical History:  Diagnosis Date   (HFimpEF) heart failure with improved ejection fraction ([place])    a. 06/2011 Echo: EF 35-40%; b. 06/2012 Echo: EF 50%; c. 12/2016 Echo: EF 55-60%, no rwma, GRI DD, mild AS/MR, nl RV fxn, nl RVSP; d. 02/2021 Echo: EF 55%, GrII DD. Mild MR. Mild to mod TR. Sev AS by VTI.   AAA (abdominal aortic aneurysm) ([place])    a. 05/2020 Abd u/s: 3.6cm.   Aortic calcification ([facility]) 05/01/2013   Bilateral renal artery stenosis ([place])    a. 06/2013 Angio/PTA: LRA: 95 (5x18 Herculink stent), RRA 60ost; b. 04/2021 Angio: mod RRA stenosis w/ patent LRA stent.   Carotid arterial disease ([place])    a. 05/2020 Carotid U/S: RICA 8-24%, LICA 23-53%   Coronary artery disease    a. 2013 s/p CABG x 3 (LIMA->LAD, VG->OM, VG->RPDA; b. 06/2012 MV: no ischemia; c. 02/2021 Cath: LM 100, RCA 90ost, 116m free LIMA->LAD nl, VG->OM2 nl, VG->RPDA nl. Mod AS (mean grad 122mg, AVA 1.1cm^2). RHC w/ elev filling pressures.   History of prior cigarette smoking 04/11/2008   Qualifier: Diagnosis of  By: BeDiona BrownerD, Amy     Hypertension    Hypothyroidism    Intraventricular hemorrhage ([facility]03/20/2014   Ischemic cardiomyopathy    a. 06/2011 Echo: EF 35-40%; b. 06/2012 Echo: EF 50%; c. 12/2016 Echo: EF  55-60%, no rwma, GRI DD; d. 02/2021 Echo: Ef 55%, GrII DD.   Moderate aortic stenosis    a. 02/2021 Echo: EF 55%, GrII DD. Sev Ca2+ of AoV. Sev AS w/ AVA by VTI 0.79cm^2; b. 02/2021 Cath: Mod AS w/ mean grad 1515m. AVA 1.1cm^2.   Peripheral arterial disease ([facility]  a. Previous left lower extremity stenting by Dr. SanJamal Collinb. 12/2012 s/p bilat ostial common iliac stenting; c. 04/2021 Angio: Patent LRA stent, mod RRA stenosis. Small AAA. Sev plaque RSFA 2/ subtl occl of inf branch of profunda. Diff Ca2+ SFA dzs w/ collats from profunda and 3V runoff. Diffuse LSFA dzs w/ collats from profunda. Subtl PT dzs. 3V runoff-->Med Rx.   Right leg DVT ([place]4/21/2014   Subclavian artery stenosis, left ([facility]2/2014   Status post stenting of the ostium and self-expanding stent placement to the left axillary artery   Venous insufficiency     Assessment: [age]M presenting s/p fall, hx VTE and pAfib on warfarin PTA.  INR 2.0 on admission and last dose taken 10/14, OK to continue warfarin per surgery/IR  PTA dosing: 2.'5mg'$  daily except '5mg'$  MWF  Goal of Therapy:  INR 2-3 Monitor platelets by anticoagulation protocol: Yes   Plan:  Warfarin 2.'5mg'$  PO x 1 today Daily INR, s/s bleeding, surgery plans  JonBertis RuddyharmD Clinical  Pharmacist ED Pharmacist Phone # [phone] 02/06/2022 4:41 PM

## 2022-02-06 NOTE — Assessment & Plan Note (Signed)
Appears to be larger based off CT abdomen today at 4.5cm Recommend f/u imaging q6 months and referral to vascular surgery

## 2022-02-06 NOTE — Assessment & Plan Note (Signed)
Echo in November 2022 suggested severe aortic stenosis by valve area but only mild by gradient.  Diagnostic catheterization showed moderate aortic stenosis with a gradient of 15 mmHg and a valve area of 1.1 cm.

## 2022-02-06 NOTE — ED Notes (Signed)
Back from CT, [facility] here at [facility]. VSS.

## 2022-02-06 NOTE — ED Provider Notes (Addendum)
[facility] Provider Note    Event Date/Time   First MD Initiated Contact with Patient 02/06/22 1252     (approximate)   History   Fall   HPI  [name] is a 81 y.o. male with past medical history of heart failure, coronary artery disease status post CABG, aortic stenosis, peripheral artery disease who presents after a fall.  Around 10 AM today patient tripped and fell onto his right side.  His elbow was to his side when he fell.  Did his head did not think he lost consciousness.  Complains of significant right-sided rib pain he has shortness of breath chronically but does feel mildly more short of breath.  Denies abdominal pain or headache.  Denies any extremity pain.     Past Medical History:  Diagnosis Date   ([place]) heart failure with improved ejection fraction ([place])    a. 06/2011 Echo: EF 35-40%; b. 06/2012 Echo: EF 50%; c. 12/2016 Echo: EF 55-60%, no rwma, GRI DD, mild AS/MR, nl RV fxn, nl RVSP; d. 02/2021 Echo: EF 55%, GrII DD. Mild MR. Mild to mod TR. Sev AS by VTI.   AAA (abdominal aortic aneurysm) ([place])    a. 05/2020 Abd u/s: 3.6cm.   Aortic calcification (HCC) 05/01/2013   Bilateral renal artery stenosis ([place])    a. 06/2013 Angio/PTA: LRA: 95 (5x18 Herculink stent), RRA 60ost; b. 04/2021 Angio: mod RRA stenosis w/ patent LRA stent.   Carotid arterial disease ([place])    a. 05/2020 Carotid U/S: RICA 3-38%, LICA 25-05%   Coronary artery disease    a. 2013 s/p CABG x 3 (LIMA->LAD, VG->OM, VG->RPDA; b. 06/2012 MV: no ischemia; c. 02/2021 Cath: LM 100, RCA 90ost, 1100m free LIMA->LAD nl, VG->OM2 nl, VG->RPDA nl. Mod AS (mean grad 114mg, AVA 1.1cm^2). RHC w/ elev filling pressures.   History of prior cigarette smoking 04/11/2008   Qualifier: Diagnosis of  By: BeDiona BrownerD, Amy     Hypertension    Hypothyroidism    Intraventricular hemorrhage (HCRank[place]03/20/2014   Ischemic cardiomyopathy    a. 06/2011 Echo: EF 35-40%; b. 06/2012 Echo: EF 50%; c. 12/2016 Echo:  EF 55-60%, no rwma, GRI DD; d. 02/2021 Echo: Ef 55%, GrII DD.   Moderate aortic stenosis    a. 02/2021 Echo: EF 55%, GrII DD. Sev Ca2+ of AoV. Sev AS w/ AVA by VTI 0.79cm^2; b. 02/2021 Cath: Mod AS w/ mean grad 1563m. AVA 1.1cm^2.   Peripheral arterial disease ([place]  a. Previous left lower extremity stenting by Dr. SanJamal Collinb. 12/2012 s/p bilat ostial common iliac stenting; c. 04/2021 Angio: Patent LRA stent, mod RRA stenosis. Small AAA. Sev plaque RSFA 2/ subtl occl of inf branch of profunda. Diff Ca2+ SFA dzs w/ collats from profunda and 3V runoff. Diffuse LSFA dzs w/ collats from profunda. Subtl PT dzs. 3V runoff-->Med Rx.   Right leg DVT (HCCEphraim4/21/2014   Subclavian artery stenosis, left (HCCP[place]2/2014   Status post stenting of the ostium and self-expanding stent placement to the left axillary artery   Venous insufficiency     Patient Active Problem List   Diagnosis Date Noted   Venous stasis ulcer of left ankle limited to breakdown of skin with varicose veins (HCCOnekama5/31/2023   Aortic valve stenosis    Long term (current) use of anticoagulants 01/27/2017   DVT (deep venous thrombosis) ([facility]CCThom[place]9/09/2016   Paroxysmal a-fib: briefly in ED now in NSR 05/24/2013   Aortic calcification ([place]CC[place]  05/01/2013   CKD (chronic kidney disease) stage 3, GFR 30-59 ml/min (HCC) 05/01/2013   Bilateral renal artery stenosis (HCC)    HTN (hypertension) 07/15/2012   Intraventricular hemorrhage ([place]) 07/12/2012   Peripheral arterial disease (HCC)    Ischemic cardiomyopathy 07/08/2011   CAD (coronary artery disease), native coronary artery 07/07/2011   Subclavian arterial stenosis ([place]) 04/25/2011   BPH (benign prostatic hyperplasia) 12/29/2010   COPD (chronic obstructive pulmonary disease) ([place]) 01/08/2010   Abdominal aortic aneurysm ([place]) 11/06/2009   HYPERCHOLESTEROLEMIA 10/09/2009   History of prior cigarette smoking 04/11/2008   Hypothyroidism 09/07/2006     Physical Exam  Triage Vital  Signs: ED Triage Vitals  Enc Vitals Group     BP 02/06/22 1149 121/62     Pulse Rate 02/06/22 1149 62     Resp 02/06/22 1149 (!) 24     Temp 02/06/22 1149 97.7 F (36.5 C)     Temp src --      SpO2 02/06/22 1149 96 %     Weight 02/06/22 1151 163 lb (73.9 kg)     Height 02/06/22 1151 '5\' 9"'$  (1.753 m)     Head Circumference --      Peak Flow --      Pain Score 02/06/22 1150 9     Pain Loc --      Pain Edu? --      Excl. in [place]? --     Most recent vital signs: Vitals:   02/06/22 1405 02/06/22 1411  BP: 135/80   Pulse: (!) 57 78  Resp: (!) 21 20  Temp: 98.5 F (36.9 C)   SpO2: 100% 100%     General: Awake, no distress.  CV:  Good peripheral perfusion.  1+ edema bilateral lower extremities Resp:  Patient is mildly dyspneic, equal breath sounds there is significant swelling over the right lateral and posterior chest wall with palpable crepitus Abd:  No distention.  Abdomen soft nontender throughout Neuro:             Awake, Alert, Oriented x 3  Other:  Abrasion over the right temple   ED Results / Procedures / Treatments  Labs (all labs ordered are listed, but only abnormal results are displayed) Labs Reviewed  CBC WITH DIFFERENTIAL/PLATELET - Abnormal; Notable for the following components:      Result Value   Neutro Abs 7.9 (*)    Abs Immature Granulocytes 0.08 (*)    All other components within normal limits  COMPREHENSIVE METABOLIC PANEL - Abnormal; Notable for the following components:   Glucose, Bld 107 (*)    BUN 25 (*)    Creatinine, Ser 1.51 (*)    Calcium 8.8 (*)    GFR, Estimated 46 (*)    All other components within normal limits  LIPASE, BLOOD - Abnormal; Notable for the following components:   Lipase 55 (*)    All other components within normal limits  PROTIME-INR - Abnormal; Notable for the following components:   Prothrombin Time 22.6 (*)    INR 2.0 (*)    All other components within normal limits     EKG     RADIOLOGY CT of the chest  without contrast reviewed interpreted by myself shows a small pneumothorax and extensive subcutaneous emphysema  PROCEDURES:  Critical Care performed: Yes, see critical care procedure note(s)  .1-3 Lead EKG Interpretation  Performed by: Rada Hay, MD Authorized by: Rada Hay, MD     Interpretation: normal  ECG rate assessment: normal     Rhythm: sinus rhythm     Ectopy: none     Conduction: normal   .Critical Care  Performed by: Rada Hay, MD Authorized by: Rada Hay, MD   Critical care provider statement:    Critical care time (minutes):  30   Critical care was time spent personally by me on the following activities:  Development of treatment plan with patient or surrogate, discussions with consultants, evaluation of patient's response to treatment, examination of patient, ordering and review of laboratory studies, ordering and review of radiographic studies, ordering and performing treatments and interventions, pulse oximetry, re-evaluation of patient's condition and review of old charts   The patient is on the cardiac monitor to evaluate for evidence of arrhythmia and/or significant heart rate changes.   MEDICATIONS ORDERED IN ED: Medications  0.9 %  sodium chloride infusion ( Intravenous New Bag/Given 02/06/22 1344)  oxyCODONE-acetaminophen (PERCOCET/ROXICET) 5-325 MG per tablet 1 tablet (1 tablet Oral Given 02/06/22 1157)  HYDROmorphone (DILAUDID) injection 0.5 mg (0.5 mg Intravenous Given 02/06/22 1305)  Tdap (BOOSTRIX) injection 0.5 mL (0.5 mLs Intramuscular Given 02/06/22 1343)  iohexol (OMNIPAQUE) 300 MG/ML solution 100 mL (100 mLs Intravenous Contrast Given 02/06/22 1351)     IMPRESSION / MDM / ASSESSMENT AND PLAN / ED COURSE  I reviewed the triage vital signs and the nursing notes.                              Patient's presentation is most consistent with acute presentation with potential threat to life or bodily  function.  Differential diagnosis includes, but is not limited to, rib fracture or pneumothorax mediastinal injury intra-abdominal injury intracranial hemorrhage  Patient is an 81 year old male on Coumadin presents after mechanical fall onto his right chest wall.  From triage she had CT head C-spine and CT without contrast ordered which shows a small right-sided pneumothorax and 3 rib fractures and subcutaneous emphysema.  My evaluation patient is mildly tachypneic but he is not hypoxic blood pressures okay.  He has an abrasion on his right temple he has palpable crepitus in the right chest wall and is mildly tachypneic abdomen is benign.  Discussed the pneumothorax and rib fractures with Dr. Dema Severin with trauma at [facility] in [place] who recommends ED to ED transfer but is okay with deferring chest tube now given it is less than 20%.  Patient placed on nonrebreather to help with pneumothorax resorption.  We will treat pain with IV Dilaudid.  Coags pending.  I will also obtain CT abdomen and pelvis to further assess for any intra-abdominal injury although my suspicion is low based on his exam.    [facility] here to take the patient to [facility] ED.  They called me to the bedside to evaluate right eye swelling which apparently happened rather suddenly.  My evaluation patient now has more significant subcutaneous emphysema about his shoulder neck traveling up to his face.  He has no change in his respiratory status is 100% on nonrebreather no increased work of breathing no stridor.  No acute intervention needed at this time.   FINAL CLINICAL IMPRESSION(S) / ED DIAGNOSES   Final diagnoses:  Traumatic pneumothorax, initial encounter  Closed fracture of multiple ribs of right side, initial encounter     Rx / DC Orders   ED Discharge Orders     None        Note:  This document was prepared using Dragon voice recognition software and may include unintentional dictation errors.   Rada Hay, MD 02/06/22 1337    Rada Hay, MD 02/06/22 1421

## 2022-02-06 NOTE — Assessment & Plan Note (Signed)
Pain control with ice pack and pain medication prn  IS to bedside

## 2022-02-06 NOTE — ED Notes (Addendum)
Out with [facility] to [facility] ED, wife present. Pt remains alert, NAD, calm, interactive, pain improved. NRB for comfort.

## 2022-02-06 NOTE — Assessment & Plan Note (Addendum)
Hx of CABG in 2013  Diagnostic catheterization in November 2022 with 3 of 3 patent grafts and elevated filling pressures.  Continue medical therapy with lipitor, coreg

## 2022-02-06 NOTE — Consult Note (Signed)
Chief Complaint: Patient was seen in consultation today for pneumothorax  Referring Physician(s): Nadeen Landau, MD  History of Present Illness: [name] is a 81 y.o. male status post fall earlier today suffering multilevel right rib fractures complicated by right pneumothorax and worsening chest wall and neck subcutaneous emphysema which has progressed to symptomatic shortness of breath.  Given relatively small component of pneumothorax, image guided right thoracostomy tube is requested.  Past Medical History:  Diagnosis Date   ([place]) heart failure with improved ejection fraction ([place])    a. 06/2011 Echo: EF 35-40%; b. 06/2012 Echo: EF 50%; c. 12/2016 Echo: EF 55-60%, no rwma, GRI DD, mild AS/MR, nl RV fxn, nl RVSP; d. 02/2021 Echo: EF 55%, GrII DD. Mild MR. Mild to mod TR. Sev AS by VTI.   AAA (abdominal aortic aneurysm) ([place])    a. 05/2020 Abd u/s: 3.6cm.   Aortic calcification (HCC) 05/01/2013   Bilateral renal artery stenosis ([place])    a. 06/2013 Angio/PTA: LRA: 95 (5x18 Herculink stent), RRA 60ost; b. 04/2021 Angio: mod RRA stenosis w/ patent LRA stent.   Carotid arterial disease ([place])    a. 05/2020 Carotid U/S: RICA 1-61%, LICA 09-60%   Coronary artery disease    a. 2013 s/p CABG x 3 (LIMA->LAD, VG->OM, VG->RPDA; b. 06/2012 MV: no ischemia; c. 02/2021 Cath: LM 100, RCA 90ost, 180m free LIMA->LAD nl, VG->OM2 nl, VG->RPDA nl. Mod AS (mean grad 117mg, AVA 1.1cm^2). RHC w/ elev filling pressures.   History of prior cigarette smoking 04/11/2008   Qualifier: Diagnosis of  By: BeDiona BrownerD, Amy     Hypertension    Hypothyroidism    Intraventricular hemorrhage ([facility]03/20/2014   Ischemic cardiomyopathy    a. 06/2011 Echo: EF 35-40%; b. 06/2012 Echo: EF 50%; c. 12/2016 Echo: EF 55-60%, no rwma, GRI DD; d. 02/2021 Echo: Ef 55%, GrII DD.   Moderate aortic stenosis    a. 02/2021 Echo: EF 55%, GrII DD. Sev Ca2+ of AoV. Sev AS w/ AVA by VTI 0.79cm^2; b. 02/2021 Cath: Mod AS w/ mean  grad 1563m. AVA 1.1cm^2.   Peripheral arterial disease ([facility]  a. Previous left lower extremity stenting by Dr. SanJamal Collinb. 12/2012 s/p bilat ostial common iliac stenting; c. 04/2021 Angio: Patent LRA stent, mod RRA stenosis. Small AAA. Sev plaque RSFA 2/ subtl occl of inf branch of profunda. Diff Ca2+ SFA dzs w/ collats from profunda and 3V runoff. Diffuse LSFA dzs w/ collats from profunda. Subtl PT dzs. 3V runoff-->Med Rx.   Right leg DVT ([facility]CC[place]4/21/2014   Subclavian artery stenosis, left ([facility]CC[place]2/2014   Status post stenting of the ostium and self-expanding stent placement to the left axillary artery   Venous insufficiency     Past Surgical History:  Procedure Laterality Date   ABDOMINAL ANGIOGRAM  07/10/2013   WITH BI-FEMORAL RUNOFF       DR ARIFletcher AnonABDOMINAL AORTAGRAM N/A 12/26/2012   Procedure: ABDOMINAL AORMaxcine HamSurgeon: MuhWellington HampshireD;  [facility];  Service: Cardiovascular;  Laterality: N/A;   ABDOMINAL AORTAGRAM N/A 07/10/2013   Procedure: ABDOMINAL AORMaxcine HamSurgeon: MuhWellington HampshireD;  [facility];  Service: Cardiovascular;  Laterality: N/A;   ABDOMINAL AORTOGRAM W/LOWER EXTREMITY N/A 05/12/2021   Procedure: ABDOMINAL AORTOGRAM W/LOWER EXTREMITY;  Surgeon: AriWellington HampshireD;  [facility];  Service: Cardiovascular;  Laterality: N/A;   ANGIOPLASTY / STENTING FEMORAL  05/2012   ANGIOPLASTY / STENTING  ILIAC Bilateral 12/26/2012   ARCH AORTOGRAM N/A 06/20/2012   Procedure: ARCH AORTOGRAM;  Surgeon: Wellington Hampshire, MD;  Location: [facility];  Service: Cardiovascular;  Laterality: N/A;   CARDIAC CATHETERIZATION  05/2012   [place]   CHOLECYSTECTOMY  1990's   CORONARY ARTERY BYPASS GRAFT  07/11/2011   Procedure: CORONARY ARTERY BYPASS GRAFTING (CABG);  Surgeon: Grace Isaac, MD;  Location: [facility];  Service: Open Heart Surgery;  Laterality: N/A;  Times 3. On Pump. Using right greater saphenous vein and left internal mammary artery.     CORONARY ARTERY BYPASS GRAFT     HERNIA REPAIR     INSERTION OF ILIAC STENT Bilateral 12/26/2012   Procedure: INSERTION OF ILIAC STENT;  Surgeon: Wellington Hampshire, MD;  Location: [facility];  Service: Cardiovascular;  Laterality: Bilateral;  Bilateral Common Iliac Artery   LEFT AND RIGHT HEART CATHETERIZATION WITH CORONARY ANGIOGRAM  07/07/2011   Procedure: LEFT AND RIGHT HEART CATHETERIZATION WITH CORONARY ANGIOGRAM;  Surgeon: Minna Merritts, MD;  Location: [facility] C[facility] LAB;  Service: Cardiovascular;;   LOWER EXTREMITY VENOGRAPHY Bilateral 12/30/2016   Procedure: Lower Extremity Venography;  Surgeon: Katha Cabal, MD;  Location: [facility] LAB;  Service: Cardiovascular;  Laterality: Bilateral;   PERCUTANEOUS STENT INTERVENTION  06/20/2012   Procedure: PERCUTANEOUS STENT INTERVENTION;  Surgeon: Wellington Hampshire, MD;  Location: [facility];  Service: Cardiovascular;;   RENAL ARTERY ANGIOPLASTY Left 07/10/2013   DR ARIDA   RIGHT/LEFT HEART CATH AND CORONARY/GRAFT ANGIOGRAPHY N/A 03/15/2021   Procedure: RIGHT/LEFT HEART CATH AND CORONARY/GRAFT ANGIOGRAPHY;  Surgeon: Wellington Hampshire, MD;  Location: [facility];  Service: Cardiovascular;  Laterality: N/A;   SUBCLAVIAN ARTERY STENT  05/2012   "2 stents" (12/26/2012)   TOOTH EXTRACTION  Spring 2017   mulitple (6)   VENA CAVA FILTER PLACEMENT  07/2012   Removable    Allergies: Plavix [clopidogrel bisulfate]  Medications: Prior to Admission medications   Medication Sig Start Date End Date Taking? Authorizing Provider  amLODipine (NORVASC) 5 MG tablet TAKE 1 TABLET BY MOUTH EVERY DAY Patient taking differently: Take 5 mg by mouth daily. 01/12/22  Yes Wellington Hampshire, MD  atorvastatin (LIPITOR) 20 MG tablet TAKE 1 TABLET BY MOUTH EVERY DAY Patient taking differently: Take 20 mg by mouth daily. 12/24/21  Yes Wellington Hampshire, MD  carvedilol (COREG) 6.25 MG tablet TAKE 1 TABLET BY MOUTH TWICE A DAY WITH MEALS Patient taking  differently: Take 6.25 mg by mouth 2 (two) times daily with a meal. 08/03/21  Yes Copland, Frederico Hamman, MD  cilostazol (PLETAL) 50 MG tablet TAKE 1 TABLET BY MOUTH TWICE A DAY Patient taking differently: Take 50 mg by mouth 2 (two) times daily. 08/03/21  Yes Wellington Hampshire, MD  furosemide (LASIX) 40 MG tablet Take 0.5 tablets (20 mg total) by mouth as directed. Take 20 mg once daily with extra 20 mg as needed for weight gain of 3 pounds overnight or 5 pounds in one week 01/19/22  Yes Theora Gianotti, NP  levothyroxine (SYNTHROID) 150 MCG tablet [place] BREAKFAST Patient taking differently: Take 150 mcg by mouth daily before breakfast. TAKE ONE TABLET BY MOUTH EVERY DAY BEFORE BREAKFAST 08/03/21  Yes Copland, Frederico Hamman, MD  mupirocin ointment (BACTROBAN) 2 % Apply 1 Application topically daily as needed (rash). 10/20/21  Yes [provider]  triamcinolone ointment (KENALOG) 0.1 % APPLY TOPICALLY TO AFFECTED AREAS TWICE A DAY FOR  [place] 5 DAYS WEEKLY TO AFFECTED AREAS Patient taking differently: Apply 1 Application topically daily as needed (rash). 12/21/21  Yes Ralene Bathe, MD  warfarin (COUMADIN) 5 MG tablet Take 0.5-1 tablets (2.5-5 mg total) by mouth See admin instructions. TAKE 1/2 TABLET DAILY BY MOUTH EXCEPT TAKE 1 TABLET ON MON, WED, FRI OR AS DIRECTED ANTICOAGULATION CLINIC 03/12/21  Yes Copland, Frederico Hamman, MD     Family History  Problem Relation Age of Onset   Alcohol abuse Father    Cirrhosis Father    Hypothyroidism Sister     Social History   Socioeconomic History   Marital status: Married    Spouse name: Not on file   Number of children: 2   Years of education: Not on file   Highest education level: Not on file  Occupational History   Occupation: Retired    Fish farm manager: RETIRED    Comment: [employer]  Tobacco Use   Smoking status: Former    Packs/day: 1.00    Years: 50.00    Total pack years: 50.00     Types: Cigarettes    Quit date: 12/25/2010    Years since quitting: 11.1   Smokeless tobacco: Never  Vaping Use   Vaping Use: Never used  Substance and Sexual Activity   Alcohol use: No   Drug use: No   Sexual activity: Not Currently  Other Topics Concern   Not on file  Social History Narrative   Exercising 3 times a week.    Moderate diet control.    O living will, no HCPOA.   Social Determinants of Health   Financial Resource Strain: Low Risk  (07/05/2021)   Overall Financial Resource Strain (CARDIA)    Difficulty of Paying Living Expenses: Not very hard  Food Insecurity: No Food Insecurity (07/05/2021)   Hunger Vital Sign    Worried About Running Out of Food in the Last Year: Never true    Ran Out of Food in the Last Year: Never true  Transportation Needs: No Transportation Needs (07/05/2021)   PRAPARE - Hydrologist (Medical): No    Lack of Transportation (Non-Medical): No  Physical Activity: Insufficiently Active (07/05/2021)   Exercise Vital Sign    Days of Exercise per Week: 7 days    Minutes of Exercise per Session: 20 min  Stress: No Stress Concern Present (07/05/2021)   [place]    Feeling of Stress : Not at all  Social Connections: Moderately Isolated (07/05/2021)   Social Connection and Isolation Panel [NHANES]    Frequency of Communication with Friends and Family: More than three times a week    Frequency of Social Gatherings with Friends and Family: More than three times a week    Attends Religious Services: Never    Marine scientist or Organizations: No    Attends Archivist Meetings: Never    Marital Status: Married     Review of Systems: A 12 point ROS discussed and pertinent positives are indicated in the HPI above.  All other systems are negative.  Vital Signs: BP 129/63   Pulse 68   Temp (!) 97.5 F (36.4 C) (Oral)   Resp 20   Ht '5\' 9"'$   (1.753 m)   Wt 73.9 kg   SpO2 97%   BMI 24.07 kg/m    Physical Exam Constitutional:      General: He is not in  acute distress. HENT:     Head:     Comments: Severe facial swelling to level of periorbital region    Mouth/Throat:     Mouth: Mucous membranes are moist.     Comments: MP4 Cardiovascular:     Rate and Rhythm: Normal rate and regular rhythm.     Heart sounds: Normal heart sounds.  Pulmonary:     Effort: Respiratory distress present.  Abdominal:     General: There is no distension.  Skin:    General: Skin is warm and dry.  Neurological:     Mental Status: He is alert and oriented to person, place, and time.     Imaging: DG Chest Portable 1 View  Result Date: 02/06/2022 CLINICAL DATA:  Pneumothorax EXAM: PORTABLE CHEST 1 VIEW COMPARISON:  Chest x-ray 02/06/2022.  CT of the chest 02/06/2022. FINDINGS: Small right pneumothorax is mildly increased from the prior x-ray no measuring 2 cm the lung apex. There is no mediastinal shift. Extensive chest wall emphysema is present in has increased now extending over the left chest and neck. There is no pleural effusion or lung consolidation. There is minimal bibasilar atelectasis, unchanged. Cardiomediastinal silhouette is within normal limits. Patient is status post cardiac surgery. Osseous structures are unchanged. IMPRESSION: 1. Small right pneumothorax is mildly increased from the prior x-ray. 2. Extensive chest wall emphysema has increased now extending over the left chest and neck. Electronically Signed   By: Ronney Asters M.D.   On: 02/06/2022 15:25   CT ABDOMEN PELVIS W CONTRAST  Result Date: 02/06/2022 CLINICAL DATA:  Fall.  Patient fell onto a hardwood floor. EXAM: CT ABDOMEN AND PELVIS WITH CONTRAST TECHNIQUE: Multidetector CT imaging of the abdomen and pelvis was performed using the standard protocol following bolus administration of intravenous contrast. RADIATION DOSE REDUCTION: This exam was performed according to  the departmental dose-optimization program which includes automated exposure control, adjustment of the mA and/or kV according to patient size and/or use of iterative reconstruction technique. CONTRAST:  163m OMNIPAQUE IOHEXOL 300 MG/ML  SOLN COMPARISON:  Current chest CT. FINDINGS: Lower chest: Right pneumothorax. Dependent right lung atelectasis. Extensive right chest subcutaneous emphysema that extends to the right abdomen, as described under the current chest CT. Hepatobiliary: Liver normal in size and attenuation. No laceration contusion. No mass. Status post cholecystectomy. No bile duct dilation. Pancreas: Normal. Spleen: No contusion or laceration.  Normal in size.  No mass. Adrenals/Urinary Tract: No adrenal mass or hemorrhage. Small left kidney with diffuse cortical thinning. Two right renal cysts, largest extending from the anterior midpole, 8.2 cm in size. Tiny low-attenuation lesions along the lower pole right kidney, also consistent with cysts. No follow-up recommended. No stones. No hydronephrosis. Normal ureters. Normal bladder. Stomach/Bowel: No stomach, bowel or mesenteric injury. Stomach is unremarkable. Small bowel and colon are normal in caliber. No wall thickening or inflammation. Scattered left colon diverticula. Portion of the colon, at the junction of the descending and sigmoid colon, enters a left inguinal hernia without evidence obstruction, incarceration or strangulation. Vascular/Lymphatic: Irregular, overall fusiform, infrarenal abdominal aortic aneurysm, 4.5 cm anterior-posterior, 9 cm in length. No aneurysm rupture. No dissection. Well-positioned inferior vena cava filter. No enlarged lymph nodes. Reproductive: Mild prostate enlargement, 4.6 x 4.4 cm. Other: No ascites or hemoperitoneum. Musculoskeletal: Right rib fractures as described under the current chest CT. No other fractures.  No bone lesions. IMPRESSION: 1. No acute findings within the abdomen or pelvis. Specifically, no  evidence injury the abdomen or pelvis. 2. Right  pneumothorax, dependent right lung atelectasis and large amount right chest wall and right abdominal subcutaneous emphysema, associated with right rib fractures, as described under the current chest CT. 3. 4.5 cm infrarenal abdominal aortic aneurysm. No evidence of rupture. Recommend follow-up CT/MR every 6 months and vascular consultation. This recommendation follows ACR consensus guidelines: White Paper of the ACR Incidental Findings Committee II on Vascular Findings. J Am Coll Radiol 2013; 10:789-794. 4. Left inguinal hernia containing a small portion of the colon at the junction of the descending and sigmoid, without evidence of obstruction, incarceration or strangulation. Electronically Signed   By: Lajean Manes M.D.   On: 02/06/2022 14:12   DG Chest Portable 1 View  Result Date: 02/06/2022 CLINICAL DATA:  Fall with chest pain. EXAM: PORTABLE CHEST 1 VIEW COMPARISON:  11/09/2021 FINDINGS: A small RIGHT apical pneumothorax is noted (less than 10%. Extensive RIGHT subcutaneous emphysema is present. A fracture of the RIGHT 5th rib is present. The cardiomediastinal silhouette is unremarkable. CABG changes and LEFT subclavian stent again noted. The RIGHT lung is clear. No pleural effusion identified. IMPRESSION: 1. Small RIGHT apical pneumothorax (less than 10%) with extensive RIGHT subcutaneous emphysema. 2. RIGHT 5th rib fracture. Critical Value/emergent results were called by telephone at the time of interpretation on 02/06/2022 at 1:39 pm to provider [facility] , who verbally acknowledged these results. Electronically Signed   By: Margarette Canada M.D.   On: 02/06/2022 13:40   CT CHEST WO CONTRAST  Result Date: 02/06/2022 CLINICAL DATA:  Fall on right side of chest EXAM: CT CHEST WITHOUT CONTRAST TECHNIQUE: Multidetector CT imaging of the chest was performed following the standard protocol without IV contrast. RADIATION DOSE REDUCTION: This exam was  performed according to the departmental dose-optimization program which includes automated exposure control, adjustment of the mA and/or kV according to patient size and/or use of iterative reconstruction technique. COMPARISON:  11/09/2021 FINDINGS: Cardiovascular: Aortic atherosclerosis. Dense aortic valve calcifications. Normal heart size. Three-vessel coronary artery calcifications status post median sternotomy and CABG. No pericardial effusion. Mediastinum/Nodes: No enlarged mediastinal, hilar, or axillary lymph nodes. Thyroid gland, trachea, and esophagus demonstrate no significant findings. Lungs/Pleura: Moderate to severe centrilobular emphysema. Diffuse bilateral bronchial wall thickening. Small volume, approximately 10% right pneumothorax. Upper Abdomen: No acute abnormality. Musculoskeletal: Nondisplaced fractures of the lateral right fifth and posterior right seventh and eighth ribs. Subcutaneous emphysema about the right chest wall. Disc degenerative disease and bridging osteophytosis throughout the thoracic spine, in keeping with DISH. IMPRESSION: 1. Small volume, approximately 10% right pneumothorax. 2. Nondisplaced fractures of the overlying lateral right fifth and posterior right seventh and eighth ribs. 3. Subcutaneous emphysema about the right chest wall. 4. Emphysema and diffuse bilateral bronchial wall thickening. 5. Coronary artery disease. 6. Dense aortic valve calcifications. Correlate for echocardiographic evidence of aortic valve dysfunction when clinically appropriate. These results were called by telephone at the time of interpretation on 02/06/2022 at 12:38 pm to Dr. Jori Moll, who verbally acknowledged these results. Aortic Atherosclerosis (ICD10-I70.0) and Emphysema (ICD10-J43.9). Electronically Signed   By: Delanna Ahmadi M.D.   On: 02/06/2022 12:46   CT Head Wo Contrast  Result Date: 02/06/2022 CLINICAL DATA:  Patient fell onto the right side.  Chest wall pain. EXAM: CT HEAD WITHOUT  CONTRAST CT CERVICAL SPINE WITHOUT CONTRAST TECHNIQUE: Multidetector CT imaging of the head and cervical spine was performed following the standard protocol without intravenous contrast. Multiplanar CT image reconstructions of the cervical spine were also generated. RADIATION DOSE REDUCTION: This exam was performed according  to the departmental dose-optimization program which includes automated exposure control, adjustment of the mA and/or kV according to patient size and/or use of iterative reconstruction technique. COMPARISON:  12/22/2016. FINDINGS: CT HEAD FINDINGS Brain: No evidence of acute infarction, hemorrhage, hydrocephalus, extra-axial collection or mass lesion/mass effect. Small old deep white matter lacunar infarcts, right frontal lobe. Mild chronic microvascular ischemic change. Small old infarct suggested in the inferior right cerebellum. These findings are stable. Vascular: No hyperdense vessel or unexpected calcification. Skull: Normal. Negative for fracture or focal lesion. Sinuses/Orbits: Globes and orbits are unremarkable. Sinuses are clear. Other: None. CT CERVICAL SPINE FINDINGS Alignment: Mild kyphosis, apex at C3-C4.  No spondylolisthesis. Skull base and vertebrae: No acute fracture. No primary bone lesion or focal pathologic process. Soft tissues and spinal canal: No prevertebral fluid or swelling. No visible canal hematoma. Disc levels: Mild loss of disc height at C2-C3. Moderate loss of disc height from C3-C4 through C6-C7. Disc bulging and endplate spurring noted at these levels. Facet degenerative changes, most evident on the right at C3-C4 and on the left at C2-C3. No convincing disc herniation. Upper chest: Subcutaneous air at the right neck base. Please refer to the patient's current CT chest for further details. Other: None. IMPRESSION: HEAD CT 1. No acute intracranial abnormalities. CERVICAL CT 1. No fracture. 2. Right neck base soft tissue air. Refer to the current chest CT for  further details. Electronically Signed   By: Lajean Manes M.D.   On: 02/06/2022 12:39   CT Cervical Spine Wo Contrast  Result Date: 02/06/2022 CLINICAL DATA:  Patient fell onto the right side.  Chest wall pain. EXAM: CT HEAD WITHOUT CONTRAST CT CERVICAL SPINE WITHOUT CONTRAST TECHNIQUE: Multidetector CT imaging of the head and cervical spine was performed following the standard protocol without intravenous contrast. Multiplanar CT image reconstructions of the cervical spine were also generated. RADIATION DOSE REDUCTION: This exam was performed according to the departmental dose-optimization program which includes automated exposure control, adjustment of the mA and/or kV according to patient size and/or use of iterative reconstruction technique. COMPARISON:  12/22/2016. FINDINGS: CT HEAD FINDINGS Brain: No evidence of acute infarction, hemorrhage, hydrocephalus, extra-axial collection or mass lesion/mass effect. Small old deep white matter lacunar infarcts, right frontal lobe. Mild chronic microvascular ischemic change. Small old infarct suggested in the inferior right cerebellum. These findings are stable. Vascular: No hyperdense vessel or unexpected calcification. Skull: Normal. Negative for fracture or focal lesion. Sinuses/Orbits: Globes and orbits are unremarkable. Sinuses are clear. Other: None. CT CERVICAL SPINE FINDINGS Alignment: Mild kyphosis, apex at C3-C4.  No spondylolisthesis. Skull base and vertebrae: No acute fracture. No primary bone lesion or focal pathologic process. Soft tissues and spinal canal: No prevertebral fluid or swelling. No visible canal hematoma. Disc levels: Mild loss of disc height at C2-C3. Moderate loss of disc height from C3-C4 through C6-C7. Disc bulging and endplate spurring noted at these levels. Facet degenerative changes, most evident on the right at C3-C4 and on the left at C2-C3. No convincing disc herniation. Upper chest: Subcutaneous air at the right neck base.  Please refer to the patient's current CT chest for further details. Other: None. IMPRESSION: HEAD CT 1. No acute intracranial abnormalities. CERVICAL CT 1. No fracture. 2. Right neck base soft tissue air. Refer to the current chest CT for further details. Electronically Signed   By: Lajean Manes M.D.   On: 02/06/2022 12:39    Labs:  CBC: Recent Labs    05/06/21 0953 07/05/21 0849 11/09/21  8182 02/06/22 1259  WBC 6.7 7.3 7.7 10.3  HGB 14.4 13.5 14.2 14.6  HCT 42.6 41.3 46.7 47.2  PLT 199 219.0 189 237    COAGS: Recent Labs    03/09/21 1111 03/10/21 0000 12/30/21 0000 01/13/22 0000 01/27/22 0000 02/06/22 1259  INR 2.2*  WILL FOLLOW   < > 1.7* 1.7* 2.0 2.0*  APTT WILL FOLLOW  --   --   --   --   --    < > = values in this interval not displayed.    BMP: Recent Labs    02/12/21 1657 02/15/21 1316 07/05/21 0849 11/09/21 0941 01/17/22 1236 02/06/22 1259  NA 138   < > 139 144 141 138  K 4.1   < > 4.0 4.4 4.8 4.6  CL 101   < > 102 109 104 105  CO2 26   < > '29 30 29 26  '$ GLUCOSE 100*   < > 78 102* 97 107*  BUN 20   < > 24* 29* 32* 25*  CALCIUM 9.3   < > 9.2 9.1 9.4 8.8*  CREATININE 1.10   < > 1.47 1.57* 1.69* 1.51*  GFRNONAA >60  --   --  44* 41* 46*   < > = values in this interval not displayed.    LIVER FUNCTION TESTS: Recent Labs    02/12/21 1657 03/24/21 1547 07/05/21 0849 02/06/22 1259  BILITOT 1.5* 0.7 0.7 1.0  AST '16 17 11 18  '$ ALT '11 22 7 12  '$ ALKPHOS 99 84 80 94  PROT 8.0 6.7 6.1 7.2  ALBUMIN 4.6 4.3 3.9 4.0    TUMOR MARKERS: No results for input(s): "AFPTM", "CEA", "CA199", "CHROMGRNA" in the last 8760 hours.  Assessment and Plan:  81 year old male with right pneumothorax and severe body wall emphysema secondary to acute multilevel right rib fractures after fall.  Plan for CT guided right thoracostomy tube with moderate sedation.  Risks and benefits of chest tube placement were discussed with the patient including bleeding, infection, damage  to adjacent structures, malfunction of the tube requiring additional procedures and sepsis.  All of the patient's questions were answered, patient is agreeable to proceed. Consent signed and in chart.    Electronically Signed: Suzette Battiest, MD 02/06/2022, 5:41 PM   I spent a total of 20 Minutes  in face to face in clinical consultation, greater than 50% of which was counseling/coordinating care for pneumothorax.

## 2022-02-06 NOTE — Assessment & Plan Note (Signed)
Echo 11/022: EF of 55%.normal LVF. Grade 2 DD  Severe aortic valve stenosis  Strict I/O Continue coreg

## 2022-02-06 NOTE — Assessment & Plan Note (Signed)
On coumadin, INR 2.0 Pharmacy to dose

## 2022-02-06 NOTE — Assessment & Plan Note (Signed)
Baseline creatinine of 1.4-1.5 Stable, continue to monitor

## 2022-02-06 NOTE — ED Notes (Signed)
As per triage provider, no blood work needed.

## 2022-02-06 NOTE — ED Notes (Addendum)
Here s/p fall. Fell around 1000. Fell in house getting up from chair, tripped over an electrical cord, fell onto hard wood floors. Percocet given in triage. Back to exam room after chest CT showed small 10% PTX with rib fx. Head and neck CT reassuring. EDP into room, at [facility]. Family at [facility]. Pt alert, NAD, calm, interactive, resps with mild increased wob, and guarded, painful resps, and R rib pain. Hit head. Takes coumadin. Denies LOC. Denies vomiting. Denies neck or spinal back pain. RN at [place] and labs. Placed on monitor.

## 2022-02-06 NOTE — ED Notes (Signed)
Pt in abd CT.

## 2022-02-06 NOTE — ED Provider Triage Note (Signed)
  Emergency Medicine Provider Triage Evaluation Note  [name] , a 81 y.o.male,  was evaluated in triage.  Pt complains of injuries from when his mechanical fall.  Patient states that he hit his head.  Denies LOC.  He is currently on blood thinners.  Current endorsing right rib pain, left middle finger pain, and abrasion about the eye.   Review of Systems  Positive: Right-sided rib pain, finger pain. Negative: Denies fever, chest pain, vomiting  Physical Exam   Vitals:   02/06/22 1149  BP: 121/62  Pulse: 62  Resp: (!) 24  Temp: 97.7 F (36.5 C)  SpO2: 96%   Gen:   Awake, no distress   Resp:  Normal effort  MSK:   Moves extremities without difficulty  Other:    Medical Decision Making  Given the patient's initial medical screening exam, the following diagnostic evaluation has been ordered. The patient will be placed in the appropriate treatment space, once one is available, to complete the evaluation and treatment. I have discussed the plan of care with the patient and I have advised the patient that an ED physician or mid-level practitioner will reevaluate their condition after the test results have been received, as the results may give them additional insight into the type of treatment they may need.    Diagnostics: Head CT, cervical spine CT, chest CT  Treatments: Oxycodone/acetaminophen.   Teodoro Spray, [place] 02/06/22 1155

## 2022-02-06 NOTE — ED Notes (Signed)
[facility]ALA reviewed by this RN, transfer consent obtained

## 2022-02-06 NOTE — ED Notes (Signed)
Pt c/o nausea, feels like he's choking. MD contacted and order received.

## 2022-02-06 NOTE — ED Triage Notes (Signed)
Pt states this morning he fell and now his right rib cage is hurting. Pt states he fell onto a wood floor with his right arm under his body. Pt denies loss of LOC. Pt states hitting his head. Pt is on coumadin, pt reports some skin tear bleeding, which has now resolved.

## 2022-02-06 NOTE — ED Notes (Signed)
Trauma Event Note  Patient transfer from [facility] after a fall and rib fractures causing severe subcutaneous emphysema through neck and face, down bilateral arms. Plans for CT guided chest tube placement due to patients history of CABG and small pneumo with severe subq air. Patient A&O, GCS 15. Per patient seems to be leveling off, not as uncomfortable as he was a couple of hours ago. IR team coming in, plans for procedure shortly.  Last imported Vital Signs BP 120/63   Pulse 72   Temp (!) 97.5 F (36.4 C) (Oral)   Resp 18   Ht '5\' 9"'$  (1.753 m)   Wt 162 lb 15.8 oz (73.9 kg)   SpO2 97%   BMI 24.07 kg/m   Trending CBC Recent Labs    02/06/22 1259  WBC 10.3  HGB 14.6  HCT 47.2  PLT 237    Trending Coag's Recent Labs    02/06/22 1259  INR 2.0*    Trending BMET Recent Labs    02/06/22 1259  NA 138  K 4.6  CL 105  CO2 26  BUN 25*  CREATININE 1.51*  GLUCOSE 107*    Cheri Ayotte L Wynn Alldredge  Trauma Response RN  Please call TRN at [phone] for further assistance.

## 2022-02-06 NOTE — Consult Note (Signed)
CC: Fall onto floor, rib fractures  HPI: [name] is an 81 y.o. male with hx of ischemic cardiomyopathy, CAD, afib, HTN, HLD, prior CABG - (on warfarin) sustained fall this morning. Presented to [facility] and underwent evaluation. He was found to have subcutaneous emphysema and ~10% right pneumothorax 2/2 right 5th, 7th, 8th rib fractures. He was ultimately transferred to [facility] for admission and ongoing care regarding his injuries  He reports that the 'swelling' of his chest wall and face appears to have stabilized over the last 1-2 hrs and hasn't changed since.  Denies any pain in his head, neck, abdomen/pelvis or any extremity. Ambulatory following event. No reported LOC.   Past Medical History:  Diagnosis Date   ([place]) heart failure with improved ejection fraction ([place])    a. 06/2011 Echo: EF 35-40%; b. 06/2012 Echo: EF 50%; c. 12/2016 Echo: EF 55-60%, no rwma, GRI DD, mild AS/MR, nl RV fxn, nl RVSP; d. 02/2021 Echo: EF 55%, GrII DD. Mild MR. Mild to mod TR. Sev AS by VTI.   AAA (abdominal aortic aneurysm) ([place])    a. 05/2020 Abd u/s: 3.6cm.   Aortic calcification (HCC) 05/01/2013   Bilateral renal artery stenosis ([place])    a. 06/2013 Angio/PTA: LRA: 95 (5x18 Herculink stent), RRA 60ost; b. 04/2021 Angio: mod RRA stenosis w/ patent LRA stent.   Carotid arterial disease ([place])    a. 05/2020 Carotid U/S: RICA 5-18%, LICA 84-16%   Coronary artery disease    a. 2013 s/p CABG x 3 (LIMA->LAD, VG->OM, VG->RPDA; b. 06/2012 MV: no ischemia; c. 02/2021 Cath: LM 100, RCA 90ost, 13m free LIMA->LAD nl, VG->OM2 nl, VG->RPDA nl. Mod AS (mean grad 123mg, AVA 1.1cm^2). RHC w/ elev filling pressures.   History of prior cigarette smoking 04/11/2008   Qualifier: Diagnosis of  By: BeDiona BrownerD, Amy     Hypertension    Hypothyroidism    Intraventricular hemorrhage (HC[place]03/20/2014   Ischemic cardiomyopathy    a. 06/2011 Echo: EF 35-40%; b. 06/2012 Echo: EF 50%; c. 12/2016 Echo: EF 55-60%, no rwma, GRI DD;  d. 02/2021 Echo: Ef 55%, GrII DD.   Moderate aortic stenosis    a. 02/2021 Echo: EF 55%, GrII DD. Sev Ca2+ of AoV. Sev AS w/ AVA by VTI 0.79cm^2; b. 02/2021 Cath: Mod AS w/ mean grad 1524m. AVA 1.1cm^2.   Peripheral arterial disease ([place]  a. Previous left lower extremity stenting by Dr. SanJamal Collinb. 12/2012 s/p bilat ostial common iliac stenting; c. 04/2021 Angio: Patent LRA stent, mod RRA stenosis. Small AAA. Sev plaque RSFA 2/ subtl occl of inf branch of profunda. Diff Ca2+ SFA dzs w/ collats from profunda and 3V runoff. Diffuse LSFA dzs w/ collats from profunda. Subtl PT dzs. 3V runoff-->Med Rx.   Right leg DVT ([place]CC[place]4/21/2014   Subclavian artery stenosis, left (HCC[place] Palo Alto2/2014   Status post stenting of the ostium and self-expanding stent placement to the left axillary artery   Venous insufficiency     Past Surgical History:  Procedure Laterality Date   ABDOMINAL ANGIOGRAM  07/10/2013   WITH BI-FEMORAL RUNOFF       DR ARIFletcher AnonABDOMINAL AORTAGRAM N/A 12/26/2012   Procedure: ABDOMINAL AORMaxcine HamSurgeon: MuhWellington HampshireD;  [facility];  Service: Cardiovascular;  Laterality: N/A;   ABDOMINAL AORTAGRAM N/A 07/10/2013   Procedure: ABDOMINAL AORMaxcine HamSurgeon: MuhWellington HampshireD;  [facility];  Service: Cardiovascular;  Laterality: N/A;  ABDOMINAL AORTOGRAM W/LOWER EXTREMITY N/A 05/12/2021   Procedure: ABDOMINAL AORTOGRAM W/LOWER EXTREMITY;  Surgeon: Wellington Hampshire, MD;  Location: [place] CV LAB;  Service: Cardiovascular;  Laterality: N/A;   ANGIOPLASTY / STENTING FEMORAL  05/2012   ANGIOPLASTY / STENTING ILIAC Bilateral 12/26/2012   ARCH AORTOGRAM N/A 06/20/2012   Procedure: ARCH AORTOGRAM;  Surgeon: Wellington Hampshire, MD;  Location: [place] CATH LAB;  Service: Cardiovascular;  Laterality: N/A;   CARDIAC CATHETERIZATION  05/2012   [place]   CHOLECYSTECTOMY  1990's   CORONARY ARTERY BYPASS GRAFT  07/11/2011   Procedure: CORONARY ARTERY BYPASS GRAFTING (CABG);   Surgeon: Grace Isaac, MD;  Location: [place];  Service: Open Heart Surgery;  Laterality: N/A;  Times 3. On Pump. Using right greater saphenous vein and left internal mammary artery.    CORONARY ARTERY BYPASS GRAFT     HERNIA REPAIR     INSERTION OF ILIAC STENT Bilateral 12/26/2012   Procedure: INSERTION OF ILIAC STENT;  Surgeon: Wellington Hampshire, MD;  Location: [place] CATH LAB;  Service: Cardiovascular;  Laterality: Bilateral;  Bilateral Common Iliac Artery   LEFT AND RIGHT HEART CATHETERIZATION WITH CORONARY ANGIOGRAM  07/07/2011   Procedure: LEFT AND RIGHT HEART CATHETERIZATION WITH CORONARY ANGIOGRAM;  Surgeon: Minna Merritts, MD;  Location: [place] CATH LAB;  Service: Cardiovascular;;   LOWER EXTREMITY VENOGRAPHY Bilateral 12/30/2016   Procedure: Lower Extremity Venography;  Surgeon: Katha Cabal, MD;  Location: [place] CV LAB;  Service: Cardiovascular;  Laterality: Bilateral;   PERCUTANEOUS STENT INTERVENTION  06/20/2012   Procedure: PERCUTANEOUS STENT INTERVENTION;  Surgeon: Wellington Hampshire, MD;  Location: [place] CATH LAB;  Service: Cardiovascular;;   RENAL ARTERY ANGIOPLASTY Left 07/10/2013   DR ARIDA   RIGHT/LEFT HEART CATH AND CORONARY/GRAFT ANGIOGRAPHY N/A 03/15/2021   Procedure: RIGHT/LEFT HEART CATH AND CORONARY/GRAFT ANGIOGRAPHY;  Surgeon: Wellington Hampshire, MD;  Location: [place] CV LAB;  Service: Cardiovascular;  Laterality: N/A;   SUBCLAVIAN ARTERY STENT  05/2012   "2 stents" (12/26/2012)   TOOTH EXTRACTION  Spring 2017   mulitple (6)   VENA CAVA FILTER PLACEMENT  07/2012   Removable    Family History  Problem Relation Age of Onset   Alcohol abuse Father    Cirrhosis Father    Hypothyroidism Sister     Social:  reports that he quit smoking about 11 years ago. His smoking use included cigarettes. He has a 50.00 pack-year smoking history. He has never used smokeless tobacco. He reports that he does not drink alcohol and does not use drugs.  Allergies:   Allergies  Allergen Reactions   Plavix [Clopidogrel Bisulfate]     Brain hemorrhage prev while on plavix and aspirin    Medications: I have reviewed the patient's current medications.  Results for orders placed or performed during the hospital encounter of 02/06/22 (from the past 48 hour(s))  CBC with Differential     Status: Abnormal   Collection Time: 02/06/22 12:59 PM  Result Value Ref Range   WBC 10.3 4.0 - 10.5 K/uL   RBC 5.21 4.22 - 5.81 MIL/uL   Hemoglobin 14.6 13.0 - 17.0 g/dL   HCT 47.2 39.0 - 52.0 %   MCV 90.6 80.0 - 100.0 fL   MCH 28.0 26.0 - 34.0 pg   MCHC 30.9 30.0 - 36.0 g/dL   RDW 14.8 11.5 - 15.5 %   Platelets 237 150 - 400 K/uL   nRBC 0.0 0.0 - 0.2 %   Neutrophils Relative %  76 %   Neutro Abs 7.9 (H) 1.7 - 7.7 K/uL   Lymphocytes Relative 14 %   Lymphs Abs 1.5 0.7 - 4.0 K/uL   Monocytes Relative 6 %   Monocytes Absolute 0.6 0.1 - 1.0 K/uL   Eosinophils Relative 2 %   Eosinophils Absolute 0.2 0.0 - 0.5 K/uL   Basophils Relative 1 %   Basophils Absolute 0.1 0.0 - 0.1 K/uL   Immature Granulocytes 1 %   Abs Immature Granulocytes 0.08 (H) 0.00 - 0.07 K/uL    Comment: Performed at [facility], [address] [place], [place] [address]  Comprehensive metabolic panel     Status: Abnormal   Collection Time: 02/06/22 12:59 PM  Result Value Ref Range   Sodium 138 135 - 145 mmol/L   Potassium 4.6 3.5 - 5.1 mmol/L   Chloride 105 98 - 111 mmol/L   CO2 26 22 - 32 mmol/L   Glucose, Bld 107 (H) 70 - 99 mg/dL    Comment: Glucose reference range applies only to samples taken after fasting for at least 8 hours.   BUN 25 (H) 8 - 23 mg/dL   Creatinine, Ser 1.51 (H) 0.61 - 1.24 mg/dL   Calcium 8.8 (L) 8.9 - 10.3 mg/dL   Total Protein 7.2 6.5 - 8.1 g/dL   Albumin 4.0 3.5 - 5.0 g/dL   AST 18 15 - 41 U/L   ALT 12 0 - 44 U/L   Alkaline Phosphatase 94 38 - 126 U/L   Total Bilirubin 1.0 0.3 - 1.2 mg/dL   GFR, Estimated 46 (L) >60 mL/min    Comment:  (NOTE) Calculated using the CKD-EPI Creatinine Equation (2021)    Anion gap 7 5 - 15    Comment: Performed at [facility], [address] [place], [place] [address]  Lipase, blood     Status: Abnormal   Collection Time: 02/06/22 12:59 PM  Result Value Ref Range   Lipase 55 (H) 11 - 51 U/L    Comment: Performed at [facility], [address] [place], [place] [address]  Protime-INR     Status: Abnormal   Collection Time: 02/06/22 12:59 PM  Result Value Ref Range   Prothrombin Time 22.6 (H) 11.4 - 15.2 seconds   INR 2.0 (H) 0.8 - 1.2    Comment: (NOTE) INR goal varies based on device and disease states. Performed at [facility], [address] [place], [place] [address]     DG Chest Portable 1 View  Result Date: 02/06/2022 CLINICAL DATA:  Pneumothorax EXAM: PORTABLE CHEST 1 VIEW COMPARISON:  Chest x-ray 02/06/2022.  CT of the chest 02/06/2022. FINDINGS: Small right pneumothorax is mildly increased from the prior x-ray no measuring 2 cm the lung apex. There is no mediastinal shift. Extensive chest wall emphysema is present in has increased now extending over the left chest and neck. There is no pleural effusion or lung consolidation. There is minimal bibasilar atelectasis, unchanged. Cardiomediastinal silhouette is within normal limits. Patient is status post cardiac surgery. Osseous structures are unchanged. IMPRESSION: 1. Small right pneumothorax is mildly increased from the prior x-ray. 2. Extensive chest wall emphysema has increased now extending over the left chest and neck. Electronically Signed   By: Ronney Asters M.D.   On: 02/06/2022 15:25   CT ABDOMEN PELVIS W CONTRAST  Result Date: 02/06/2022 CLINICAL DATA:  Fall.  Patient fell onto a hardwood floor. EXAM: CT ABDOMEN AND PELVIS WITH CONTRAST TECHNIQUE: Multidetector CT imaging  of the abdomen and pelvis was performed using the standard protocol following bolus administration of intravenous  contrast. RADIATION DOSE REDUCTION: This exam was performed according to the departmental dose-optimization program which includes automated exposure control, adjustment of the mA and/or kV according to patient size and/or use of iterative reconstruction technique. CONTRAST:  159m OMNIPAQUE IOHEXOL 300 MG/ML  SOLN COMPARISON:  Current chest CT. FINDINGS: Lower chest: Right pneumothorax. Dependent right lung atelectasis. Extensive right chest subcutaneous emphysema that extends to the right abdomen, as described under the current chest CT. Hepatobiliary: Liver normal in size and attenuation. No laceration contusion. No mass. Status post cholecystectomy. No bile duct dilation. Pancreas: Normal. Spleen: No contusion or laceration.  Normal in size.  No mass. Adrenals/Urinary Tract: No adrenal mass or hemorrhage. Small left kidney with diffuse cortical thinning. Two right renal cysts, largest extending from the anterior midpole, 8.2 cm in size. Tiny low-attenuation lesions along the lower pole right kidney, also consistent with cysts. No follow-up recommended. No stones. No hydronephrosis. Normal ureters. Normal bladder. Stomach/Bowel: No stomach, bowel or mesenteric injury. Stomach is unremarkable. Small bowel and colon are normal in caliber. No wall thickening or inflammation. Scattered left colon diverticula. Portion of the colon, at the junction of the descending and sigmoid colon, enters a left inguinal hernia without evidence obstruction, incarceration or strangulation. Vascular/Lymphatic: Irregular, overall fusiform, infrarenal abdominal aortic aneurysm, 4.5 cm anterior-posterior, 9 cm in length. No aneurysm rupture. No dissection. Well-positioned inferior vena cava filter. No enlarged lymph nodes. Reproductive: Mild prostate enlargement, 4.6 x 4.4 cm. Other: No ascites or hemoperitoneum. Musculoskeletal: Right rib fractures as described under the current chest CT. No other fractures.  No bone lesions.  IMPRESSION: 1. No acute findings within the abdomen or pelvis. Specifically, no evidence injury the abdomen or pelvis. 2. Right pneumothorax, dependent right lung atelectasis and large amount right chest wall and right abdominal subcutaneous emphysema, associated with right rib fractures, as described under the current chest CT. 3. 4.5 cm infrarenal abdominal aortic aneurysm. No evidence of rupture. Recommend follow-up CT/MR every 6 months and vascular consultation. This recommendation follows ACR consensus guidelines: Madylyn Insco Paper of the ACR Incidental Findings Committee II on Vascular Findings. J Am Coll Radiol 2013; 10:789-794. 4. Left inguinal hernia containing a small portion of the colon at the junction of the descending and sigmoid, without evidence of obstruction, incarceration or strangulation. Electronically Signed   By: DLajean ManesM.D.   On: 02/06/2022 14:12   DG Chest Portable 1 View  Result Date: 02/06/2022 CLINICAL DATA:  Fall with chest pain. EXAM: PORTABLE CHEST 1 VIEW COMPARISON:  11/09/2021 FINDINGS: A small RIGHT apical pneumothorax is noted (less than 10%. Extensive RIGHT subcutaneous emphysema is present. A fracture of the RIGHT 5th rib is present. The cardiomediastinal silhouette is unremarkable. CABG changes and LEFT subclavian stent again noted. The RIGHT lung is clear. No pleural effusion identified. IMPRESSION: 1. Small RIGHT apical pneumothorax (less than 10%) with extensive RIGHT subcutaneous emphysema. 2. RIGHT 5th rib fracture. Critical Value/emergent results were called by telephone at the time of interpretation on 02/06/2022 at 1:39 pm to [facility], who verbally acknowledged these results. Electronically Signed   By: JMargarette CanadaM.D.   On: 02/06/2022 13:40   CT CHEST WO CONTRAST  Result Date: 02/06/2022 CLINICAL DATA:  Fall on right side of chest EXAM: CT CHEST WITHOUT CONTRAST TECHNIQUE: Multidetector CT imaging of the chest was performed following the  standard protocol without IV contrast. RADIATION DOSE REDUCTION:  This exam was performed according to the departmental dose-optimization program which includes automated exposure control, adjustment of the mA and/or kV according to patient size and/or use of iterative reconstruction technique. COMPARISON:  11/09/2021 FINDINGS: Cardiovascular: Aortic atherosclerosis. Dense aortic valve calcifications. Normal heart size. Three-vessel coronary artery calcifications status post median sternotomy and CABG. No pericardial effusion. Mediastinum/Nodes: No enlarged mediastinal, hilar, or axillary lymph nodes. Thyroid gland, trachea, and esophagus demonstrate no significant findings. Lungs/Pleura: Moderate to severe centrilobular emphysema. Diffuse bilateral bronchial wall thickening. Small volume, approximately 10% right pneumothorax. Upper Abdomen: No acute abnormality. Musculoskeletal: Nondisplaced fractures of the lateral right fifth and posterior right seventh and eighth ribs. Subcutaneous emphysema about the right chest wall. Disc degenerative disease and bridging osteophytosis throughout the thoracic spine, in keeping with DISH. IMPRESSION: 1. Small volume, approximately 10% right pneumothorax. 2. Nondisplaced fractures of the overlying lateral right fifth and posterior right seventh and eighth ribs. 3. Subcutaneous emphysema about the right chest wall. 4. Emphysema and diffuse bilateral bronchial wall thickening. 5. Coronary artery disease. 6. Dense aortic valve calcifications. Correlate for echocardiographic evidence of aortic valve dysfunction when clinically appropriate. These results were called by telephone at the time of interpretation on 02/06/2022 at 12:38 pm to Dr. Jori Moll, who verbally acknowledged these results. Aortic Atherosclerosis (ICD10-I70.0) and Emphysema (ICD10-J43.9). Electronically Signed   By: Delanna Ahmadi M.D.   On: 02/06/2022 12:46   CT Head Wo Contrast  Result Date: 02/06/2022 CLINICAL  DATA:  Patient fell onto the right side.  Chest wall pain. EXAM: CT HEAD WITHOUT CONTRAST CT CERVICAL SPINE WITHOUT CONTRAST TECHNIQUE: Multidetector CT imaging of the head and cervical spine was performed following the standard protocol without intravenous contrast. Multiplanar CT image reconstructions of the cervical spine were also generated. RADIATION DOSE REDUCTION: This exam was performed according to the departmental dose-optimization program which includes automated exposure control, adjustment of the mA and/or kV according to patient size and/or use of iterative reconstruction technique. COMPARISON:  12/22/2016. FINDINGS: CT HEAD FINDINGS Brain: No evidence of acute infarction, hemorrhage, hydrocephalus, extra-axial collection or mass lesion/mass effect. Small old deep Ellyce Lafevers matter lacunar infarcts, right frontal lobe. Mild chronic microvascular ischemic change. Small old infarct suggested in the inferior right cerebellum. These findings are stable. Vascular: No hyperdense vessel or unexpected calcification. Skull: Normal. Negative for fracture or focal lesion. Sinuses/Orbits: Globes and orbits are unremarkable. Sinuses are clear. Other: None. CT CERVICAL SPINE FINDINGS Alignment: Mild kyphosis, apex at C3-C4.  No spondylolisthesis. Skull base and vertebrae: No acute fracture. No primary bone lesion or focal pathologic process. Soft tissues and spinal canal: No prevertebral fluid or swelling. No visible canal hematoma. Disc levels: Mild loss of disc height at C2-C3. Moderate loss of disc height from C3-C4 through C6-C7. Disc bulging and endplate spurring noted at these levels. Facet degenerative changes, most evident on the right at C3-C4 and on the left at C2-C3. No convincing disc herniation. Upper chest: Subcutaneous air at the right neck base. Please refer to the patient's current CT chest for further details. Other: None. IMPRESSION: HEAD CT 1. No acute intracranial abnormalities. CERVICAL CT 1. No  fracture. 2. Right neck base soft tissue air. Refer to the current chest CT for further details. Electronically Signed   By: Lajean Manes M.D.   On: 02/06/2022 12:39   CT Cervical Spine Wo Contrast  Result Date: 02/06/2022 CLINICAL DATA:  Patient fell onto the right side.  Chest wall pain. EXAM: CT HEAD WITHOUT CONTRAST CT CERVICAL SPINE WITHOUT CONTRAST  TECHNIQUE: Multidetector CT imaging of the head and cervical spine was performed following the standard protocol without intravenous contrast. Multiplanar CT image reconstructions of the cervical spine were also generated. RADIATION DOSE REDUCTION: This exam was performed according to the departmental dose-optimization program which includes automated exposure control, adjustment of the mA and/or kV according to patient size and/or use of iterative reconstruction technique. COMPARISON:  12/22/2016. FINDINGS: CT HEAD FINDINGS Brain: No evidence of acute infarction, hemorrhage, hydrocephalus, extra-axial collection or mass lesion/mass effect. Small old deep Mann Skaggs matter lacunar infarcts, right frontal lobe. Mild chronic microvascular ischemic change. Small old infarct suggested in the inferior right cerebellum. These findings are stable. Vascular: No hyperdense vessel or unexpected calcification. Skull: Normal. Negative for fracture or focal lesion. Sinuses/Orbits: Globes and orbits are unremarkable. Sinuses are clear. Other: None. CT CERVICAL SPINE FINDINGS Alignment: Mild kyphosis, apex at C3-C4.  No spondylolisthesis. Skull base and vertebrae: No acute fracture. No primary bone lesion or focal pathologic process. Soft tissues and spinal canal: No prevertebral fluid or swelling. No visible canal hematoma. Disc levels: Mild loss of disc height at C2-C3. Moderate loss of disc height from C3-C4 through C6-C7. Disc bulging and endplate spurring noted at these levels. Facet degenerative changes, most evident on the right at C3-C4 and on the left at C2-C3. No  convincing disc herniation. Upper chest: Subcutaneous air at the right neck base. Please refer to the patient's current CT chest for further details. Other: None. IMPRESSION: HEAD CT 1. No acute intracranial abnormalities. CERVICAL CT 1. No fracture. 2. Right neck base soft tissue air. Refer to the current chest CT for further details. Electronically Signed   By: Lajean Manes M.D.   On: 02/06/2022 12:39    ROS - all of the below systems have been reviewed with the patient and positives are indicated with bold text General: chills, fever or night sweats Eyes: blurry vision or double vision ENT: epistaxis or sore throat Allergy/Immunology: itchy/watery eyes or nasal congestion Hematologic/Lymphatic: bleeding problems, blood clots or swollen lymph nodes Endocrine: temperature intolerance or unexpected weight changes Breast: new or changing breast lumps or nipple discharge Resp: cough, shortness of breath, or wheezing CV: chest pain or dyspnea on exertion GI: as per HPI GU: dysuria, trouble voiding, or hematuria MSK: joint pain or joint stiffness Neuro: TIA or stroke symptoms Derm: pruritus and skin lesion changes Psych: anxiety and depression  PE Blood pressure (!) 112/96, pulse 70, temperature (!) 97.5 F (36.4 C), temperature source Oral, resp. rate 19, height '5\' 9"'$  (1.753 m), weight 73.9 kg, SpO2 98 %. Constitutional: NAD; conversant; +SubQ emphysema chest, neck, face. Eyes: Moist conjunctiva; no lid lag; anicteric; PERRL Neck: Trachea midline.  Lungs: normal respiratory effort, unlabored. No stridor.  CV: RRR; no palpable thrills; no pitting edema GI: Abd soft, nontender, nondistended MSK: Normal range of motion of extremities Psychiatric: Appropriate affect; alert and oriented x3  Results for orders placed or performed during the hospital encounter of 02/06/22 (from the past 48 hour(s))  CBC with Differential     Status: Abnormal   Collection Time: 02/06/22 12:59 PM  Result Value  Ref Range   WBC 10.3 4.0 - 10.5 K/uL   RBC 5.21 4.22 - 5.81 MIL/uL   Hemoglobin 14.6 13.0 - 17.0 g/dL   HCT 47.2 39.0 - 52.0 %   MCV 90.6 80.0 - 100.0 fL   MCH 28.0 26.0 - 34.0 pg   MCHC 30.9 30.0 - 36.0 g/dL   RDW 14.8 11.5 - 15.5 %  Platelets 237 150 - 400 K/uL   nRBC 0.0 0.0 - 0.2 %   Neutrophils Relative % 76 %   Neutro Abs 7.9 (H) 1.7 - 7.7 K/uL   Lymphocytes Relative 14 %   Lymphs Abs 1.5 0.7 - 4.0 K/uL   Monocytes Relative 6 %   Monocytes Absolute 0.6 0.1 - 1.0 K/uL   Eosinophils Relative 2 %   Eosinophils Absolute 0.2 0.0 - 0.5 K/uL   Basophils Relative 1 %   Basophils Absolute 0.1 0.0 - 0.1 K/uL   Immature Granulocytes 1 %   Abs Immature Granulocytes 0.08 (H) 0.00 - 0.07 K/uL    Comment: Performed at [facility], [address] [place], [place] [address]  Comprehensive metabolic panel     Status: Abnormal   Collection Time: 02/06/22 12:59 PM  Result Value Ref Range   Sodium 138 135 - 145 mmol/L   Potassium 4.6 3.5 - 5.1 mmol/L   Chloride 105 98 - 111 mmol/L   CO2 26 22 - 32 mmol/L   Glucose, Bld 107 (H) 70 - 99 mg/dL    Comment: Glucose reference range applies only to samples taken after fasting for at least 8 hours.   BUN 25 (H) 8 - 23 mg/dL   Creatinine, Ser 1.51 (H) 0.61 - 1.24 mg/dL   Calcium 8.8 (L) 8.9 - 10.3 mg/dL   Total Protein 7.2 6.5 - 8.1 g/dL   Albumin 4.0 3.5 - 5.0 g/dL   AST 18 15 - 41 U/L   ALT 12 0 - 44 U/L   Alkaline Phosphatase 94 38 - 126 U/L   Total Bilirubin 1.0 0.3 - 1.2 mg/dL   GFR, Estimated 46 (L) >60 mL/min    Comment: (NOTE) Calculated using the CKD-EPI Creatinine Equation (2021)    Anion gap 7 5 - 15    Comment: Performed at [facility], [address] [place], [place] [address]  Lipase, blood     Status: Abnormal   Collection Time: 02/06/22 12:59 PM  Result Value Ref Range   Lipase 55 (H) 11 - 51 U/L    Comment: Performed at [facility], [address] [place], [place] [address]   Protime-INR     Status: Abnormal   Collection Time: 02/06/22 12:59 PM  Result Value Ref Range   Prothrombin Time 22.6 (H) 11.4 - 15.2 seconds   INR 2.0 (H) 0.8 - 1.2    Comment: (NOTE) INR goal varies based on device and disease states. Performed at [facility], [address] [place], [place] [address]     DG Chest Portable 1 View  Result Date: 02/06/2022 CLINICAL DATA:  Pneumothorax EXAM: PORTABLE CHEST 1 VIEW COMPARISON:  Chest x-ray 02/06/2022.  CT of the chest 02/06/2022. FINDINGS: Small right pneumothorax is mildly increased from the prior x-ray no measuring 2 cm the lung apex. There is no mediastinal shift. Extensive chest wall emphysema is present in has increased now extending over the left chest and neck. There is no pleural effusion or lung consolidation. There is minimal bibasilar atelectasis, unchanged. Cardiomediastinal silhouette is within normal limits. Patient is status post cardiac surgery. Osseous structures are unchanged. IMPRESSION: 1. Small right pneumothorax is mildly increased from the prior x-ray. 2. Extensive chest wall emphysema has increased now extending over the left chest and neck. Electronically Signed   By: Ronney Asters M.D.   On: 02/06/2022 15:25   CT ABDOMEN PELVIS W CONTRAST  Result Date: 02/06/2022 CLINICAL DATA:  Fall.  Patient fell onto a hardwood floor. EXAM: CT ABDOMEN AND PELVIS WITH CONTRAST TECHNIQUE: Multidetector CT imaging of the abdomen and pelvis was performed using the standard protocol following bolus administration of intravenous contrast. RADIATION DOSE REDUCTION: This exam was performed according to the departmental dose-optimization program which includes automated exposure control, adjustment of the mA and/or kV according to patient size and/or use of iterative reconstruction technique. CONTRAST:  141m OMNIPAQUE IOHEXOL 300 MG/ML  SOLN COMPARISON:  Current chest CT. FINDINGS: Lower chest: Right pneumothorax. Dependent right  lung atelectasis. Extensive right chest subcutaneous emphysema that extends to the right abdomen, as described under the current chest CT. Hepatobiliary: Liver normal in size and attenuation. No laceration contusion. No mass. Status post cholecystectomy. No bile duct dilation. Pancreas: Normal. Spleen: No contusion or laceration.  Normal in size.  No mass. Adrenals/Urinary Tract: No adrenal mass or hemorrhage. Small left kidney with diffuse cortical thinning. Two right renal cysts, largest extending from the anterior midpole, 8.2 cm in size. Tiny low-attenuation lesions along the lower pole right kidney, also consistent with cysts. No follow-up recommended. No stones. No hydronephrosis. Normal ureters. Normal bladder. Stomach/Bowel: No stomach, bowel or mesenteric injury. Stomach is unremarkable. Small bowel and colon are normal in caliber. No wall thickening or inflammation. Scattered left colon diverticula. Portion of the colon, at the junction of the descending and sigmoid colon, enters a left inguinal hernia without evidence obstruction, incarceration or strangulation. Vascular/Lymphatic: Irregular, overall fusiform, infrarenal abdominal aortic aneurysm, 4.5 cm anterior-posterior, 9 cm in length. No aneurysm rupture. No dissection. Well-positioned inferior vena cava filter. No enlarged lymph nodes. Reproductive: Mild prostate enlargement, 4.6 x 4.4 cm. Other: No ascites or hemoperitoneum. Musculoskeletal: Right rib fractures as described under the current chest CT. No other fractures.  No bone lesions. IMPRESSION: 1. No acute findings within the abdomen or pelvis. Specifically, no evidence injury the abdomen or pelvis. 2. Right pneumothorax, dependent right lung atelectasis and large amount right chest wall and right abdominal subcutaneous emphysema, associated with right rib fractures, as described under the current chest CT. 3. 4.5 cm infrarenal abdominal aortic aneurysm. No evidence of rupture. Recommend  follow-up CT/MR every 6 months and vascular consultation. This recommendation follows ACR consensus guidelines: Tamma Brigandi Paper of the ACR Incidental Findings Committee II on Vascular Findings. J Am Coll Radiol 2013; 10:789-794. 4. Left inguinal hernia containing a small portion of the colon at the junction of the descending and sigmoid, without evidence of obstruction, incarceration or strangulation. Electronically Signed   By: DLajean ManesM.D.   On: 02/06/2022 14:12   DG Chest Portable 1 View  Result Date: 02/06/2022 CLINICAL DATA:  Fall with chest pain. EXAM: PORTABLE CHEST 1 VIEW COMPARISON:  11/09/2021 FINDINGS: A small RIGHT apical pneumothorax is noted (less than 10%. Extensive RIGHT subcutaneous emphysema is present. A fracture of the RIGHT 5th rib is present. The cardiomediastinal silhouette is unremarkable. CABG changes and LEFT subclavian stent again noted. The RIGHT lung is clear. No pleural effusion identified. IMPRESSION: 1. Small RIGHT apical pneumothorax (less than 10%) with extensive RIGHT subcutaneous emphysema. 2. RIGHT 5th rib fracture. Critical Value/emergent results were called by telephone at the time of interpretation on 02/06/2022 at 1:39 pm [facility] who verbally acknowledged these results. Electronically Signed   By: JMargarette CanadaM.D.   On: 02/06/2022 13:40   CT CHEST WO CONTRAST  Result Date: 02/06/2022 CLINICAL DATA:  Fall on right side of chest EXAM: CT CHEST WITHOUT CONTRAST TECHNIQUE:  Multidetector CT imaging of the chest was performed following the standard protocol without IV contrast. RADIATION DOSE REDUCTION: This exam was performed according to the departmental dose-optimization program which includes automated exposure control, adjustment of the mA and/or kV according to patient size and/or use of iterative reconstruction technique. COMPARISON:  11/09/2021 FINDINGS: Cardiovascular: Aortic atherosclerosis. Dense aortic valve calcifications. Normal heart  size. Three-vessel coronary artery calcifications status post median sternotomy and CABG. No pericardial effusion. Mediastinum/Nodes: No enlarged mediastinal, hilar, or axillary lymph nodes. Thyroid gland, trachea, and esophagus demonstrate no significant findings. Lungs/Pleura: Moderate to severe centrilobular emphysema. Diffuse bilateral bronchial wall thickening. Small volume, approximately 10% right pneumothorax. Upper Abdomen: No acute abnormality. Musculoskeletal: Nondisplaced fractures of the lateral right fifth and posterior right seventh and eighth ribs. Subcutaneous emphysema about the right chest wall. Disc degenerative disease and bridging osteophytosis throughout the thoracic spine, in keeping with DISH. IMPRESSION: 1. Small volume, approximately 10% right pneumothorax. 2. Nondisplaced fractures of the overlying lateral right fifth and posterior right seventh and eighth ribs. 3. Subcutaneous emphysema about the right chest wall. 4. Emphysema and diffuse bilateral bronchial wall thickening. 5. Coronary artery disease. 6. Dense aortic valve calcifications. Correlate for echocardiographic evidence of aortic valve dysfunction when clinically appropriate. These results were called by telephone at the time of interpretation on 02/06/2022 at 12:38 pm to Dr. Jori Moll, who verbally acknowledged these results. Aortic Atherosclerosis (ICD10-I70.0) and Emphysema (ICD10-J43.9). Electronically Signed   By: Delanna Ahmadi M.D.   On: 02/06/2022 12:46   CT Head Wo Contrast  Result Date: 02/06/2022 CLINICAL DATA:  Patient fell onto the right side.  Chest wall pain. EXAM: CT HEAD WITHOUT CONTRAST CT CERVICAL SPINE WITHOUT CONTRAST TECHNIQUE: Multidetector CT imaging of the head and cervical spine was performed following the standard protocol without intravenous contrast. Multiplanar CT image reconstructions of the cervical spine were also generated. RADIATION DOSE REDUCTION: This exam was performed according to the  departmental dose-optimization program which includes automated exposure control, adjustment of the mA and/or kV according to patient size and/or use of iterative reconstruction technique. COMPARISON:  12/22/2016. FINDINGS: CT HEAD FINDINGS Brain: No evidence of acute infarction, hemorrhage, hydrocephalus, extra-axial collection or mass lesion/mass effect. Small old deep [place] Nessel matter lacunar infarcts, right frontal lobe. Mild chronic microvascular ischemic change. Small old infarct suggested in the inferior right cerebellum. These findings are stable. Vascular: No hyperdense vessel or unexpected calcification. Skull: Normal. Negative for fracture or focal lesion. Sinuses/Orbits: Globes and orbits are unremarkable. Sinuses are clear. Other: None. CT CERVICAL SPINE FINDINGS Alignment: Mild kyphosis, apex at C3-C4.  No spondylolisthesis. Skull base and vertebrae: No acute fracture. No primary bone lesion or focal pathologic process. Soft tissues and spinal canal: No prevertebral fluid or swelling. No visible canal hematoma. Disc levels: Mild loss of disc height at C2-C3. Moderate loss of disc height from C3-C4 through C6-C7. Disc bulging and endplate spurring noted at these levels. Facet degenerative changes, most evident on the right at C3-C4 and on the left at C2-C3. No convincing disc herniation. Upper chest: Subcutaneous air at the right neck base. Please refer to the patient's current CT chest for further details. Other: None. IMPRESSION: HEAD CT 1. No acute intracranial abnormalities. CERVICAL CT 1. No fracture. 2. Right neck base soft tissue air. Refer to the current chest CT for further details. Electronically Signed   By: Lajean Manes M.D.   On: 02/06/2022 12:39   CT Cervical Spine Wo Contrast  Result Date: 02/06/2022 CLINICAL DATA:  Patient fell  onto the right side.  Chest wall pain. EXAM: CT HEAD WITHOUT CONTRAST CT CERVICAL SPINE WITHOUT CONTRAST TECHNIQUE: Multidetector CT imaging of the head and  cervical spine was performed following the standard protocol without intravenous contrast. Multiplanar CT image reconstructions of the cervical spine were also generated. RADIATION DOSE REDUCTION: This exam was performed according to the departmental dose-optimization program which includes automated exposure control, adjustment of the mA and/or kV according to patient size and/or use of iterative reconstruction technique. COMPARISON:  12/22/2016. FINDINGS: CT HEAD FINDINGS Brain: No evidence of acute infarction, hemorrhage, hydrocephalus, extra-axial collection or mass lesion/mass effect. Small old deep Lois Slagel matter lacunar infarcts, right frontal lobe. Mild chronic microvascular ischemic change. Small old infarct suggested in the inferior right cerebellum. These findings are stable. Vascular: No hyperdense vessel or unexpected calcification. Skull: Normal. Negative for fracture or focal lesion. Sinuses/Orbits: Globes and orbits are unremarkable. Sinuses are clear. Other: None. CT CERVICAL SPINE FINDINGS Alignment: Mild kyphosis, apex at C3-C4.  No spondylolisthesis. Skull base and vertebrae: No acute fracture. No primary bone lesion or focal pathologic process. Soft tissues and spinal canal: No prevertebral fluid or swelling. No visible canal hematoma. Disc levels: Mild loss of disc height at C2-C3. Moderate loss of disc height from C3-C4 through C6-C7. Disc bulging and endplate spurring noted at these levels. Facet degenerative changes, most evident on the right at C3-C4 and on the left at C2-C3. No convincing disc herniation. Upper chest: Subcutaneous air at the right neck base. Please refer to the patient's current CT chest for further details. Other: None. IMPRESSION: HEAD CT 1. No acute intracranial abnormalities. CERVICAL CT 1. No fracture. 2. Right neck base soft tissue air. Refer to the current chest CT for further details. Electronically Signed   By: Lajean Manes M.D.   On: 02/06/2022 12:39      A/P: [name] is an 81 y.o. male with ischemic cardiomyopathy, CAD, afib, HTN, HLD, prior CABG - (on warfarin)  - here following fall  Right rib fractures 5th, 7th, 8th - multimodal pain control SubQ emphysema - 2/2 above. He believes this has stabilized over last hour or two and hasn't worsened. That said, I reviewed his case with one of our thoracic surgeons as well. Concern being he could have trapped lung which may not fully collapse in setting of prior CABG. We therefore discussed with IR for CT guided placement to avoid scenario where this tube is placed into lung parenchyma. Dr. Serafina Royals planning CT guided placement Afib on anticoagulation - INR 2.0 - IR ok with placement under this condition. Ok to continue warfarin for now  Dispo - progressive, we will follow with you as well   I spent a total of 70 minutes in both face-to-face and non-face-to-face activities, excluding procedures performed, for this visit on the date of this encounter.  Nadeen Landau, [facility], [place]

## 2022-02-06 NOTE — H&P (Signed)
History and Physical    Patient: [name]:[id number] DOB: 1940-08-08 DOA: 02/06/2022 DOS: the patient was seen and examined on 02/06/2022 PCP: Owens Loffler, MD  Patient coming from:  [facility] ED  - lives with his wife.    Chief Complaint: fall with pneumothorax   HPI: [name] is a 81 y.o. male with medical history significant of CAD s/p CABG, ischemic cardiomyopathy, HTN, HLD, hypothyroidism, AAA, CKD stage III, PAF, hx of DVT, aortic valve stenosis, PAD here after being see at [facility] for fall with rib fractures as well as a pneumothorax and subcutaneous emphysema. He is on coumadin, INR of 2.0. He was transferred to [facility] ED for trauma surgery to see.   He was at his desk today and when he got up he tripped over an electric cord on the ground and fell onto his right side with his arm stuck underneath him.  He had immediate pain in his ribs and was short of breath.     Denies any fever/chills, vision changes/headaches, chest pain or palpitations, cough, abdominal pain, N/V/D, dysuria or leg swelling.    Does not smoke or drink.   ER Course:  vitals: afebrile, bp: 116/89, HR: 80, RR: 20, oxygen: 96% on 4L [place] Pertinent labs: BUN: 25, creatinine: 1.51, INR: 2.0 Ct head: no acute finding CT cervical spine: no acute fracture. Right neck base soft tissue air.  CT chest: Small volume, approximately 10% right pneumothorax. 2. Nondisplaced fractures of the overlying lateral right fifth and posterior right seventh and eighth ribs. 3. Subcutaneous emphysema about the right chest wall. 4. Emphysema and diffuse bilateral bronchial wall thickening. 5. Coronary artery disease. 6. Dense aortic valve calcifications. Correlate for echocardiographic evidence of aortic valve dysfunction when clinically appropriate. CT abdomen/pelvis: no acute finding  4.5cm infrarenal AAA. F/u CT/MR q 6 months/vascular consult   Left inguinal hernia containing a small portion of the colon at the junction  of the descending and sigmoid, without evidence of obstruction, incarceration or strangulation. In ED: trauma consulted. Trh asked to admit    Review of Systems: As mentioned in the history of present illness. All other systems reviewed and are negative. Past Medical History:  Diagnosis Date   ([place]) heart failure with improved ejection fraction ([place])    a. 06/2011 Echo: EF 35-40%; b. 06/2012 Echo: EF 50%; c. 12/2016 Echo: EF 55-60%, no rwma, GRI DD, mild AS/MR, nl RV fxn, nl RVSP; d. 02/2021 Echo: EF 55%, GrII DD. Mild MR. Mild to mod TR. Sev AS by VTI.   AAA (abdominal aortic aneurysm) ([place])    a. 05/2020 Abd u/s: 3.6cm.   Aortic calcification (HCC) 05/01/2013   Bilateral renal artery stenosis ([place])    a. 06/2013 Angio/PTA: LRA: 95 (5x18 Herculink stent), RRA 60ost; b. 04/2021 Angio: mod RRA stenosis w/ patent LRA stent.   Carotid arterial disease ([place])    a. 05/2020 Carotid U/S: RICA 9-56%, LICA 21-30%   Coronary artery disease    a. 2013 s/p CABG x 3 (LIMA->LAD, VG->OM, VG->RPDA; b. 06/2012 MV: no ischemia; c. 02/2021 Cath: LM 100, RCA 90ost, 178m free LIMA->LAD nl, VG->OM2 nl, VG->RPDA nl. Mod AS (mean grad 165mg, AVA 1.1cm^2). RHC w/ elev filling pressures.   History of prior cigarette smoking 04/11/2008   Qualifier: Diagnosis of  By: BeDiona BrownerD, Amy     Hypertension    Hypothyroidism    Intraventricular hemorrhage ([place]03/20/2014   Ischemic cardiomyopathy    a. 06/2011 Echo: EF 35-40%; b.  06/2012 Echo: EF 50%; c. 12/2016 Echo: EF 55-60%, no rwma, GRI DD; d. 02/2021 Echo: Ef 55%, GrII DD.   Moderate aortic stenosis    a. 02/2021 Echo: EF 55%, GrII DD. Sev Ca2+ of AoV. Sev AS w/ AVA by VTI 0.79cm^2; b. 02/2021 Cath: Mod AS w/ mean grad 73mHg. AVA 1.1cm^2.   Peripheral arterial disease ([place]    a. Previous left lower extremity stenting by Dr. SJamal Collin  b. 12/2012 s/p bilat ostial common iliac stenting; c. 04/2021 Angio: Patent LRA stent, mod RRA stenosis. Small AAA. Sev plaque RSFA 2/ subtl  occl of inf branch of profunda. Diff Ca2+ SFA dzs w/ collats from profunda and 3V runoff. Diffuse LSFA dzs w/ collats from profunda. Subtl PT dzs. 3V runoff-->Med Rx.   [place] leg DVT ([place] 08/13/2012   Subclavian artery stenosis, left ([place] 05/2012   Status post stenting of the ostium and self-expanding stent placement to the left axillary artery   Venous insufficiency    Past Surgical History:  Procedure Laterality Date   ABDOMINAL ANGIOGRAM  07/10/2013   WITH BI-FEMORAL RUNOFF       DR AFletcher Anon  ABDOMINAL AORTAGRAM N/A 12/26/2012   Procedure: ABDOMINAL AMaxcine Ham  Surgeon: MWellington Hampshire MD;  [facility];  Service: Cardiovascular;  Laterality: N/A;   ABDOMINAL AORTAGRAM N/A 07/10/2013   Procedure: ABDOMINAL AMaxcine Ham  Surgeon: MWellington Hampshire MD;  [place];  Service: Cardiovascular;  Laterality: N/A;   ABDOMINAL AORTOGRAM W/LOWER EXTREMITY N/A 05/12/2021   Procedure: ABDOMINAL AORTOGRAM W/LOWER EXTREMITY;  Surgeon: AWellington Hampshire MD;  [facility];  Service: Cardiovascular;  Laterality: N/A;   ANGIOPLASTY / STENTING FEMORAL  05/2012   ANGIOPLASTY / STENTING ILIAC Bilateral 12/26/2012   ARCH AORTOGRAM N/A 06/20/2012   Procedure: ARCH AORTOGRAM;  Surgeon: MWellington Hampshire MD;  [facility];  Service: Cardiovascular;  Laterality: N/A;   CARDIAC CATHETERIZATION  05/2012   [place]  CHOLECYSTECTOMY  1990's   CORONARY ARTERY BYPASS GRAFT  07/11/2011   Procedure: CORONARY ARTERY BYPASS GRAFTING (CABG);  Surgeon: EGrace Isaac MD;  [facility]: [facility]  Service: Open Heart Surgery;  Laterality: N/A;  Times 3. On Pump. Using right greater saphenous vein and left internal mammary artery.    CORONARY ARTERY BYPASS GRAFT     HERNIA REPAIR     INSERTION OF ILIAC STENT Bilateral 12/26/2012   Procedure: INSERTION OF ILIAC STENT;  Surgeon: MWellington Hampshire MD;  [place];  Service: Cardiovascular;  Laterality: Bilateral;  Bilateral  Common Iliac Artery   LEFT AND RIGHT HEART CATHETERIZATION WITH CORONARY ANGIOGRAM  07/07/2011   Procedure: LEFT AND RIGHT HEART CATHETERIZATION WITH CORONARY ANGIOGRAM;  Surgeon: TMinna Merritts MD;  [facility];  Service: Cardiovascular;;   LOWER EXTREMITY VENOGRAPHY Bilateral 12/30/2016   Procedure: Lower Extremity Venography;  Surgeon: SKatha Cabal MD;  [place] [facility];  Service: Cardiovascular;  Laterality: Bilateral;   PERCUTANEOUS STENT INTERVENTION  06/20/2012   Procedure: PERCUTANEOUS STENT INTERVENTION;  Surgeon: MWellington Hampshire MD;  [place];  Service: Cardiovascular;;   RENAL ARTERY ANGIOPLASTY Left 07/10/2013   DR ARIDA   RIGHT/LEFT HEART CATH AND CORONARY/GRAFT ANGIOGRAPHY N/A 03/15/2021   Procedure: RIGHT/LEFT HEART CATH AND CORONARY/GRAFT ANGIOGRAPHY;  Surgeon: AWellington Hampshire MD;  [facility];  Service: Cardiovascular;  Laterality: N/A;   SUBCLAVIAN ARTERY STENT  05/2012   "2 stents" (12/26/2012)  TOOTH EXTRACTION  Spring 2017   mulitple (6)   VENA CAVA FILTER PLACEMENT  07/2012   Removable   Social History:  reports that he quit smoking about 11 years ago. His smoking use included cigarettes. He has a 50.00 pack-year smoking history. He has never used smokeless tobacco. He reports that he does not drink alcohol and does not use drugs.  Allergies  Allergen Reactions   Plavix [Clopidogrel Bisulfate]     Brain hemorrhage prev while on plavix and aspirin    Family History  Problem Relation Age of Onset   Alcohol abuse Father    Cirrhosis Father    Hypothyroidism Sister     Prior to Admission medications   Medication Sig Start Date End Date Taking? Authorizing Provider  amLODipine (NORVASC) 5 MG tablet TAKE 1 TABLET BY MOUTH EVERY DAY 01/12/22   Wellington Hampshire, MD  atorvastatin (LIPITOR) 20 MG tablet TAKE 1 TABLET BY MOUTH EVERY DAY 12/24/21   Wellington Hampshire, MD  carvedilol (COREG) 6.25 MG  tablet TAKE 1 TABLET BY MOUTH TWICE A DAY WITH MEALS 08/03/21   Copland, Frederico Hamman, MD  cilostazol (PLETAL) 50 MG tablet TAKE 1 TABLET BY MOUTH TWICE A DAY 08/03/21   Wellington Hampshire, MD  doxycycline (VIBRAMYCIN) 100 MG capsule Take 100 mg by mouth 2 (two) times daily. 10/20/21   [provider]  furosemide (LASIX) 40 MG tablet Take 0.5 tablets (20 mg total) by mouth as directed. Take 20 mg once daily with extra 20 mg as needed for weight gain of 3 pounds overnight or 5 pounds in one week 01/19/22   Theora Gianotti, NP  levothyroxine (SYNTHROID) 150 MCG tablet TAKE ONE TABLET BY MOUTH EVERY DAY BEFORE BREAKFAST 08/03/21   Copland, Frederico Hamman, MD  mupirocin ointment (BACTROBAN) 2 % SMARTSIG:sparingly Both Nares Twice Daily 10/20/21   [provider]  triamcinolone ointment (KENALOG) 0.1 % APPLY TOPICALLY TO AFFECTED AREAS TWICE A DAY FOR 14 DAYS THEN MAY APPLY 5 DAYS WEEKLY TO AFFECTED AREAS 12/21/21   Ralene Bathe, MD  warfarin (COUMADIN) 5 MG tablet Take 0.5-1 tablets (2.5-5 mg total) by mouth See admin instructions. TAKE 1/2 TABLET DAILY BY MOUTH EXCEPT TAKE 1 TABLET ON MON, WED, FRI OR AS DIRECTED ANTICOAGULATION CLINIC 03/12/21   Owens Loffler, MD    Physical Exam: Vitals:   02/06/22 1715 02/06/22 1745 02/06/22 1755 02/06/22 1800  BP: 129/63 (!) 143/83 (!) 150/71 110/66  Pulse: 68 84 83 82  Resp: 20 (!) '22 18 16  '$ Temp:      TempSrc:      SpO2: 97% 97% 98% 96%  Weight:      Height:       General:  Appears calm and comfortable and is in NAD. Emphysematous changes of his eyes/face.  Eyes:  PERRL, EOMI, normal lids, iris ENT:  grossly normal hearing, lips & tongue, mmm; no teeth Neck:  no LAD, masses or thyromegaly; no carotid bruits Cardiovascular:  RRR, no m/r/g. 1+ BLE edema  Respiratory:   CTA bilaterally with no wheezes/rales/rhonchi.  Normal respiratory effort. Abdomen:  soft, NT, ND, NABS Back:   normal alignment, no CVAT Skin:  crepitus from  emphysematous changes in left arm, left chest, neck and face. No bruising over right lateral chest wall. Small laceration to right side of forehead.  Musculoskeletal:  grossly normal tone BUE/BLE, good ROM,. TTP over right side of his lateral chest wall.  Lower extremity:  Limited foot exam with no  ulcerations.  2+ distal pulses. Psychiatric:  grossly normal mood and affect, speech fluent and appropriate, AOx3 Neurologic:  CN 2-12 grossly intact, moves all extremities in coordinated fashion, sensation intact   Radiological Exams on Admission: Independently reviewed - see discussion in A/P where applicable  DG Chest Portable 1 View  Result Date: 02/06/2022 CLINICAL DATA:  Pneumothorax EXAM: PORTABLE CHEST 1 VIEW COMPARISON:  Chest x-ray 02/06/2022.  CT of the chest 02/06/2022. FINDINGS: Small right pneumothorax is mildly increased from the prior x-ray no measuring 2 cm the lung apex. There is no mediastinal shift. Extensive chest wall emphysema is present in has increased now extending over the left chest and neck. There is no pleural effusion or lung consolidation. There is minimal bibasilar atelectasis, unchanged. Cardiomediastinal silhouette is within normal limits. Patient is status post cardiac surgery. Osseous structures are unchanged. IMPRESSION: 1. Small right pneumothorax is mildly increased from the prior x-ray. 2. Extensive chest wall emphysema has increased now extending over the left chest and neck. Electronically Signed   By: Ronney Asters M.D.   On: 02/06/2022 15:25   CT ABDOMEN PELVIS W CONTRAST  Result Date: 02/06/2022 CLINICAL DATA:  Fall.  Patient fell onto a hardwood floor. EXAM: CT ABDOMEN AND PELVIS WITH CONTRAST TECHNIQUE: Multidetector CT imaging of the abdomen and pelvis was performed using the standard protocol following bolus administration of intravenous contrast. RADIATION DOSE REDUCTION: This exam was performed according to the departmental dose-optimization program  which includes automated exposure control, adjustment of the mA and/or kV according to patient size and/or use of iterative reconstruction technique. CONTRAST:  154m OMNIPAQUE IOHEXOL 300 MG/ML  SOLN COMPARISON:  Current chest CT. FINDINGS: Lower chest: Right pneumothorax. Dependent right lung atelectasis. Extensive right chest subcutaneous emphysema that extends to the right abdomen, as described under the current chest CT. Hepatobiliary: Liver normal in size and attenuation. No laceration contusion. No mass. Status post cholecystectomy. No bile duct dilation. Pancreas: Normal. Spleen: No contusion or laceration.  Normal in size.  No mass. Adrenals/Urinary Tract: No adrenal mass or hemorrhage. Small left kidney with diffuse cortical thinning. Two right renal cysts, largest extending from the anterior midpole, 8.2 cm in size. Tiny low-attenuation lesions along the lower pole right kidney, also consistent with cysts. No follow-up recommended. No stones. No hydronephrosis. Normal ureters. Normal bladder. Stomach/Bowel: No stomach, bowel or mesenteric injury. Stomach is unremarkable. Small bowel and colon are normal in caliber. No wall thickening or inflammation. Scattered left colon diverticula. Portion of the colon, at the junction of the descending and sigmoid colon, enters a left inguinal hernia without evidence obstruction, incarceration or strangulation. Vascular/Lymphatic: Irregular, overall fusiform, infrarenal abdominal aortic aneurysm, 4.5 cm anterior-posterior, 9 cm in length. No aneurysm rupture. No dissection. Well-positioned inferior vena cava filter. No enlarged lymph nodes. Reproductive: Mild prostate enlargement, 4.6 x 4.4 cm. Other: No ascites or hemoperitoneum. Musculoskeletal: Right rib fractures as described under the current chest CT. No other fractures.  No bone lesions. IMPRESSION: 1. No acute findings within the abdomen or pelvis. Specifically, no evidence injury the abdomen or pelvis. 2.  Right pneumothorax, dependent right lung atelectasis and large amount right chest wall and right abdominal subcutaneous emphysema, associated with right rib fractures, as described under the current chest CT. 3. 4.5 cm infrarenal abdominal aortic aneurysm. No evidence of rupture. Recommend follow-up CT/MR every 6 months and vascular consultation. This recommendation follows ACR consensus guidelines: White Paper of the ACR Incidental Findings Committee II on Vascular Findings. J Am Coll Radiol  2013; 95:188-416. 4. Left inguinal hernia containing a small portion of the colon at the junction of the descending and sigmoid, without evidence of obstruction, incarceration or strangulation. Electronically Signed   By: Lajean Manes M.D.   On: 02/06/2022 14:12   DG Chest Portable 1 View  Result Date: 02/06/2022 CLINICAL DATA:  Fall with chest pain. EXAM: PORTABLE CHEST 1 VIEW COMPARISON:  11/09/2021 FINDINGS: A small RIGHT apical pneumothorax is noted (less than 10%. Extensive RIGHT subcutaneous emphysema is present. A fracture of the RIGHT 5th rib is present. The cardiomediastinal silhouette is unremarkable. CABG changes and LEFT subclavian stent again noted. The RIGHT lung is clear. No pleural effusion identified. IMPRESSION: 1. Small RIGHT apical pneumothorax (less than 10%) with extensive RIGHT subcutaneous emphysema. 2. RIGHT 5th rib fracture. Critical Value/emergent results were called by telephone at the time of interpretation on 02/06/2022 at 1:39 pm to provider [facility] , who verbally acknowledged these results. Electronically Signed   By: Margarette Canada M.D.   On: 02/06/2022 13:40   CT CHEST WO CONTRAST  Result Date: 02/06/2022 CLINICAL DATA:  Fall on right side of chest EXAM: CT CHEST WITHOUT CONTRAST TECHNIQUE: Multidetector CT imaging of the chest was performed following the standard protocol without IV contrast. RADIATION DOSE REDUCTION: This exam was performed according to the departmental  dose-optimization program which includes automated exposure control, adjustment of the mA and/or kV according to patient size and/or use of iterative reconstruction technique. COMPARISON:  11/09/2021 FINDINGS: Cardiovascular: Aortic atherosclerosis. Dense aortic valve calcifications. Normal heart size. Three-vessel coronary artery calcifications status post median sternotomy and CABG. No pericardial effusion. Mediastinum/Nodes: No enlarged mediastinal, hilar, or axillary lymph nodes. Thyroid gland, trachea, and esophagus demonstrate no significant findings. Lungs/Pleura: Moderate to severe centrilobular emphysema. Diffuse bilateral bronchial wall thickening. Small volume, approximately 10% right pneumothorax. Upper Abdomen: No acute abnormality. Musculoskeletal: Nondisplaced fractures of the lateral right fifth and posterior right seventh and eighth ribs. Subcutaneous emphysema about the right chest wall. Disc degenerative disease and bridging osteophytosis throughout the thoracic spine, in keeping with DISH. IMPRESSION: 1. Small volume, approximately 10% right pneumothorax. 2. Nondisplaced fractures of the overlying lateral right fifth and posterior right seventh and eighth ribs. 3. Subcutaneous emphysema about the right chest wall. 4. Emphysema and diffuse bilateral bronchial wall thickening. 5. Coronary artery disease. 6. Dense aortic valve calcifications. Correlate for echocardiographic evidence of aortic valve dysfunction when clinically appropriate. These results were called by telephone at the time of interpretation on 02/06/2022 at 12:38 pm to Dr. Jori Moll, who verbally acknowledged these results. Aortic Atherosclerosis (ICD10-I70.0) and Emphysema (ICD10-J43.9). Electronically Signed   By: Delanna Ahmadi M.D.   On: 02/06/2022 12:46   CT Head Wo Contrast  Result Date: 02/06/2022 CLINICAL DATA:  Patient fell onto the right side.  Chest wall pain. EXAM: CT HEAD WITHOUT CONTRAST CT CERVICAL SPINE WITHOUT  CONTRAST TECHNIQUE: Multidetector CT imaging of the head and cervical spine was performed following the standard protocol without intravenous contrast. Multiplanar CT image reconstructions of the cervical spine were also generated. RADIATION DOSE REDUCTION: This exam was performed according to the departmental dose-optimization program which includes automated exposure control, adjustment of the mA and/or kV according to patient size and/or use of iterative reconstruction technique. COMPARISON:  12/22/2016. FINDINGS: CT HEAD FINDINGS Brain: No evidence of acute infarction, hemorrhage, hydrocephalus, extra-axial collection or mass lesion/mass effect. Small old deep white matter lacunar infarcts, right frontal lobe. Mild chronic microvascular ischemic change. Small old infarct suggested in the inferior right  cerebellum. These findings are stable. Vascular: No hyperdense vessel or unexpected calcification. Skull: Normal. Negative for fracture or focal lesion. Sinuses/Orbits: Globes and orbits are unremarkable. Sinuses are clear. Other: None. CT CERVICAL SPINE FINDINGS Alignment: Mild kyphosis, apex at C3-C4.  No spondylolisthesis. Skull base and vertebrae: No acute fracture. No primary bone lesion or focal pathologic process. Soft tissues and spinal canal: No prevertebral fluid or swelling. No visible canal hematoma. Disc levels: Mild loss of disc height at C2-C3. Moderate loss of disc height from C3-C4 through C6-C7. Disc bulging and endplate spurring noted at these levels. Facet degenerative changes, most evident on the right at C3-C4 and on the left at C2-C3. No convincing disc herniation. Upper chest: Subcutaneous air at the right neck base. Please refer to the patient's current CT chest for further details. Other: None. IMPRESSION: HEAD CT 1. No acute intracranial abnormalities. CERVICAL CT 1. No fracture. 2. Right neck base soft tissue air. Refer to the current chest CT for further details. Electronically Signed    By: Lajean Manes M.D.   On: 02/06/2022 12:39   CT Cervical Spine Wo Contrast  Result Date: 02/06/2022 CLINICAL DATA:  Patient fell onto the right side.  Chest wall pain. EXAM: CT HEAD WITHOUT CONTRAST CT CERVICAL SPINE WITHOUT CONTRAST TECHNIQUE: Multidetector CT imaging of the head and cervical spine was performed following the standard protocol without intravenous contrast. Multiplanar CT image reconstructions of the cervical spine were also generated. RADIATION DOSE REDUCTION: This exam was performed according to the departmental dose-optimization program which includes automated exposure control, adjustment of the mA and/or kV according to patient size and/or use of iterative reconstruction technique. COMPARISON:  12/22/2016. FINDINGS: CT HEAD FINDINGS Brain: No evidence of acute infarction, hemorrhage, hydrocephalus, extra-axial collection or mass lesion/mass effect. Small old deep white matter lacunar infarcts, right frontal lobe. Mild chronic microvascular ischemic change. Small old infarct suggested in the inferior right cerebellum. These findings are stable. Vascular: No hyperdense vessel or unexpected calcification. Skull: Normal. Negative for fracture or focal lesion. Sinuses/Orbits: Globes and orbits are unremarkable. Sinuses are clear. Other: None. CT CERVICAL SPINE FINDINGS Alignment: Mild kyphosis, apex at C3-C4.  No spondylolisthesis. Skull base and vertebrae: No acute fracture. No primary bone lesion or focal pathologic process. Soft tissues and spinal canal: No prevertebral fluid or swelling. No visible canal hematoma. Disc levels: Mild loss of disc height at C2-C3. Moderate loss of disc height from C3-C4 through C6-C7. Disc bulging and endplate spurring noted at these levels. Facet degenerative changes, most evident on the right at C3-C4 and on the left at C2-C3. No convincing disc herniation. Upper chest: Subcutaneous air at the right neck base. Please refer to the patient's current CT  chest for further details. Other: None. IMPRESSION: HEAD CT 1. No acute intracranial abnormalities. CERVICAL CT 1. No fracture. 2. Right neck base soft tissue air. Refer to the current chest CT for further details. Electronically Signed   By: Lajean Manes M.D.   On: 02/06/2022 12:39    EKG: Independently reviewed.  NSR with rate 79, rbbb; nonspecific ST changes with no evidence of acute ischemia/artifact and lead wanderer    Labs on Admission: I have personally reviewed the available labs and imaging studies at the time of the admission.  Pertinent labs:   BUN: 25,  creatinine: 1.51,  INR: 2.0  Assessment and Plan: Principal Problem:   Pneumothorax Active Problems:   Rib fractures   Abdominal aortic aneurysm (HCC)   CAD (coronary artery disease),  native coronary artery   Ischemic cardiomyopathy   Peripheral arterial disease (HCC)   Aortic valve stenosis   DVT (deep venous thrombosis) (HCC)   HTN (hypertension)   CKD (chronic kidney disease) stage 3, GFR 30-59 ml/min (HCC)   HYPERCHOLESTEROLEMIA   COPD (chronic obstructive pulmonary disease) (HCC)   Hypothyroidism    Assessment and Plan: * Pneumothorax 81 year old male presenting to ED after fall onto right side with rib fractures and subsequent pneumothorax with worsening emphysematous changes.  -place in progressive -NPO -trauma surgery consulted, plan for IR to place chest tube -INR okay at 2.0, okay with coumadin -repeat CXR in AM -no signs of respiratory compromise, no stridor with good oxygenation. He does feel like he can clear throat, will continue to watch airway closely with worsening emphysema.   Rib fractures Pain control with ice pack and pain medication prn  IS to bedside   Abdominal aortic aneurysm (HCC) Appears to be larger based off CT abdomen today at 4.5cm Recommend f/u imaging q6 months and referral to vascular surgery   CAD (coronary artery disease), native coronary artery Hx of CABG in 2013   Diagnostic catheterization in November 2022 with 3 of 3 patent grafts and elevated filling pressures.  Continue medical therapy with lipitor, coreg   Ischemic cardiomyopathy Echo 11/022: EF of 55%.normal LVF. Grade 2 DD  Severe aortic valve stenosis  Strict I/O Continue coreg  Peripheral arterial disease (HCC)  Worsening ABIs late last year with lower extremity duplex this year showing significant bilateral SFA disease with collateral flow in bilateral three-vessel runoff.  -ABI in 03/2022 -continue lipitor, pletal and coumadin   Aortic valve stenosis Echo in November 2022 suggested severe aortic stenosis by valve area but only mild by gradient.  Diagnostic catheterization showed moderate aortic stenosis with a gradient of 15 mmHg and a valve area of 1.1 cm.  DVT (deep venous thrombosis) (HCC) On coumadin, INR 2.0 Pharmacy to dose   HTN (hypertension) Well controlled, continue norvasc '5mg'$  daily, coreg 6.'25mg'$  BID  CKD (chronic kidney disease) stage 3, GFR 30-59 ml/min (HCC) Baseline creatinine of 1.4-1.5 Stable, continue to monitor   HYPERCHOLESTEROLEMIA Continue lipitor '20mg'$  daily   COPD (chronic obstructive pulmonary disease) (HCC) No s/sx of exacerbation  Continue SABA prn, on no daily therapy   Hypothyroidism TSH .42 in 06/2021 Continue home synthroid     Advance Care Planning:   Code Status: Full Code   Consults: trauma/IR   DVT Prophylaxis: coumadin   Family Communication: wife at bedside   Severity of Illness: The appropriate patient status for this patient is OBSERVATION. Observation status is judged to be reasonable and necessary in order to provide the required intensity of service to ensure the patient's safety. The patient's presenting symptoms, physical exam findings, and initial radiographic and laboratory data in the context of their medical condition is felt to place them at decreased risk for further clinical deterioration. Furthermore, it is  anticipated that the patient will be medically stable for discharge from the hospital within 2 midnights of admission.   Author: Orma Flaming, MD 02/06/2022 6:04 PM  For on call review [email or web address].

## 2022-02-06 NOTE — ED Notes (Signed)
Warm blankets provided.

## 2022-02-06 NOTE — Assessment & Plan Note (Addendum)
81 year old male presenting to ED after fall onto right side with rib fractures and subsequent pneumothorax with worsening emphysematous changes.  -place in progressive -NPO -trauma surgery consulted, plan for IR to place chest tube -INR okay at 2.0, okay with coumadin -repeat CXR in AM -no signs of respiratory compromise, no stridor with good oxygenation. He does feel like he can clear throat, will continue to watch airway closely with worsening emphysema.

## 2022-02-06 NOTE — Assessment & Plan Note (Signed)
Worsening ABIs late last year with lower extremity duplex this year showing significant bilateral SFA disease with collateral flow in bilateral three-vessel runoff.  -ABI in 03/2022 -continue lipitor, pletal and coumadin

## 2022-02-06 NOTE — Assessment & Plan Note (Signed)
TSH .42 in 06/2021 Continue home synthroid

## 2022-02-07 ENCOUNTER — Inpatient Hospital Stay (HOSPITAL_COMMUNITY): Payer: Medicare Other

## 2022-02-07 ENCOUNTER — Observation Stay (HOSPITAL_COMMUNITY): Payer: Medicare Other

## 2022-02-07 DIAGNOSIS — N179 Acute kidney failure, unspecified: Secondary | ICD-10-CM | POA: Diagnosis present

## 2022-02-07 DIAGNOSIS — I130 Hypertensive heart and chronic kidney disease with heart failure and stage 1 through stage 4 chronic kidney disease, or unspecified chronic kidney disease: Secondary | ICD-10-CM | POA: Diagnosis present

## 2022-02-07 DIAGNOSIS — I959 Hypotension, unspecified: Secondary | ICD-10-CM | POA: Diagnosis present

## 2022-02-07 DIAGNOSIS — I255 Ischemic cardiomyopathy: Secondary | ICD-10-CM | POA: Diagnosis present

## 2022-02-07 DIAGNOSIS — S270XXD Traumatic pneumothorax, subsequent encounter: Secondary | ICD-10-CM | POA: Diagnosis not present

## 2022-02-07 DIAGNOSIS — I700 Atherosclerosis of aorta: Secondary | ICD-10-CM | POA: Diagnosis not present

## 2022-02-07 DIAGNOSIS — S27321D Contusion of lung, unilateral, subsequent encounter: Secondary | ICD-10-CM | POA: Diagnosis not present

## 2022-02-07 DIAGNOSIS — J449 Chronic obstructive pulmonary disease, unspecified: Secondary | ICD-10-CM | POA: Diagnosis not present

## 2022-02-07 DIAGNOSIS — S270XXA Traumatic pneumothorax, initial encounter: Secondary | ICD-10-CM | POA: Diagnosis present

## 2022-02-07 DIAGNOSIS — Z79890 Hormone replacement therapy (postmenopausal): Secondary | ICD-10-CM | POA: Diagnosis not present

## 2022-02-07 DIAGNOSIS — J431 Panlobular emphysema: Secondary | ICD-10-CM | POA: Diagnosis not present

## 2022-02-07 DIAGNOSIS — Z951 Presence of aortocoronary bypass graft: Secondary | ICD-10-CM | POA: Diagnosis not present

## 2022-02-07 DIAGNOSIS — I5032 Chronic diastolic (congestive) heart failure: Secondary | ICD-10-CM | POA: Diagnosis present

## 2022-02-07 DIAGNOSIS — N1831 Chronic kidney disease, stage 3a: Secondary | ICD-10-CM | POA: Diagnosis present

## 2022-02-07 DIAGNOSIS — T797XXA Traumatic subcutaneous emphysema, initial encounter: Secondary | ICD-10-CM | POA: Diagnosis present

## 2022-02-07 DIAGNOSIS — E7800 Pure hypercholesterolemia, unspecified: Secondary | ICD-10-CM | POA: Diagnosis present

## 2022-02-07 DIAGNOSIS — R9431 Abnormal electrocardiogram [ECG] [EKG]: Secondary | ICD-10-CM | POA: Diagnosis not present

## 2022-02-07 DIAGNOSIS — S2241XA Multiple fractures of ribs, right side, initial encounter for closed fracture: Secondary | ICD-10-CM | POA: Diagnosis present

## 2022-02-07 DIAGNOSIS — I10 Essential (primary) hypertension: Secondary | ICD-10-CM | POA: Diagnosis not present

## 2022-02-07 DIAGNOSIS — I714 Abdominal aortic aneurysm, without rupture, unspecified: Secondary | ICD-10-CM | POA: Diagnosis present

## 2022-02-07 DIAGNOSIS — J9601 Acute respiratory failure with hypoxia: Secondary | ICD-10-CM | POA: Diagnosis present

## 2022-02-07 DIAGNOSIS — J939 Pneumothorax, unspecified: Secondary | ICD-10-CM | POA: Diagnosis present

## 2022-02-07 DIAGNOSIS — J439 Emphysema, unspecified: Secondary | ICD-10-CM | POA: Diagnosis present

## 2022-02-07 DIAGNOSIS — S27321A Contusion of lung, unilateral, initial encounter: Secondary | ICD-10-CM | POA: Diagnosis present

## 2022-02-07 DIAGNOSIS — Z79899 Other long term (current) drug therapy: Secondary | ICD-10-CM | POA: Diagnosis not present

## 2022-02-07 DIAGNOSIS — I350 Nonrheumatic aortic (valve) stenosis: Secondary | ICD-10-CM | POA: Diagnosis present

## 2022-02-07 DIAGNOSIS — I739 Peripheral vascular disease, unspecified: Secondary | ICD-10-CM | POA: Diagnosis present

## 2022-02-07 DIAGNOSIS — Z86718 Personal history of other venous thrombosis and embolism: Secondary | ICD-10-CM | POA: Diagnosis not present

## 2022-02-07 DIAGNOSIS — I480 Paroxysmal atrial fibrillation: Secondary | ICD-10-CM | POA: Diagnosis present

## 2022-02-07 DIAGNOSIS — W1809XA Striking against other object with subsequent fall, initial encounter: Secondary | ICD-10-CM | POA: Diagnosis present

## 2022-02-07 DIAGNOSIS — E039 Hypothyroidism, unspecified: Secondary | ICD-10-CM | POA: Diagnosis present

## 2022-02-07 DIAGNOSIS — J9811 Atelectasis: Secondary | ICD-10-CM | POA: Diagnosis present

## 2022-02-07 DIAGNOSIS — Z87891 Personal history of nicotine dependence: Secondary | ICD-10-CM | POA: Diagnosis not present

## 2022-02-07 LAB — BASIC METABOLIC PANEL
Anion gap: 8 (ref 5–15)
BUN: 21 mg/dL (ref 8–23)
CO2: 26 mmol/L (ref 22–32)
Calcium: 9 mg/dL (ref 8.9–10.3)
Chloride: 106 mmol/L (ref 98–111)
Creatinine, Ser: 1.52 mg/dL — ABNORMAL HIGH (ref 0.61–1.24)
GFR, Estimated: 46 mL/min — ABNORMAL LOW (ref >60)
Glucose, Bld: 98 mg/dL (ref 70–99)
Potassium: 4.6 mmol/L (ref 3.5–5.1)
Sodium: 140 mmol/L (ref 135–145)

## 2022-02-07 LAB — CBC
HCT: 44.3 % (ref 39.0–52.0)
Hemoglobin: 13.7 g/dL (ref 13.0–17.0)
MCH: 28.4 pg (ref 26.0–34.0)
MCHC: 30.9 g/dL (ref 30.0–36.0)
MCV: 91.7 fL (ref 80.0–100.0)
Platelets: 217 10*3/uL (ref 150–400)
RBC: 4.83 MIL/uL (ref 4.22–5.81)
RDW: 14.8 % (ref 11.5–15.5)
WBC: 7.6 10*3/uL (ref 4.0–10.5)
nRBC: 0 % (ref 0.0–0.2)

## 2022-02-07 LAB — PROTIME-INR
INR: 2.2 — ABNORMAL HIGH (ref 0.8–1.2)
Prothrombin Time: 24.5 seconds — ABNORMAL HIGH (ref 11.4–15.2)

## 2022-02-07 MED ORDER — LIDOCAINE 5 % EX PTCH
1.0000 | MEDICATED_PATCH | CUTANEOUS | Status: DC
  Administered 2022-02-07 – 2022-02-12 (×5): 1 via TRANSDERMAL
  Filled 2022-02-07 (×6): qty 1

## 2022-02-07 MED ORDER — SODIUM CHLORIDE 0.9% FLUSH
10.0000 mL | Freq: Three times a day (TID) | INTRAVENOUS | Status: DC
  Administered 2022-02-07 – 2022-02-12 (×13): 10 mL via INTRAPLEURAL

## 2022-02-07 MED ORDER — OXYCODONE HCL 5 MG PO TABS
5.0000 mg | ORAL_TABLET | ORAL | Status: DC | PRN
  Administered 2022-02-07 – 2022-02-09 (×7): 10 mg via ORAL
  Administered 2022-02-10 – 2022-02-11 (×4): 5 mg via ORAL
  Filled 2022-02-07 (×2): qty 1
  Filled 2022-02-07: qty 2
  Filled 2022-02-07: qty 1
  Filled 2022-02-07 (×3): qty 2
  Filled 2022-02-07: qty 1
  Filled 2022-02-07 (×2): qty 2

## 2022-02-07 MED ORDER — WARFARIN SODIUM 5 MG PO TABS
5.0000 mg | ORAL_TABLET | Freq: Once | ORAL | Status: AC
  Administered 2022-02-07: 5 mg via ORAL
  Filled 2022-02-07 (×2): qty 1

## 2022-02-07 NOTE — Progress Notes (Signed)
Referring Physician(s):Christopher Dema Severin, MD  Supervising Physician: Jacqulynn Cadet  Patient Status:  [facility] - In-pt  Chief Complaint:  Multi right rib fx with small right PTX, s/p chest tube placement by Dr. Serafina Royals on 02/06/22  Subjective:  Pt sitting in stretcher. NAD. Spouse at bedside.  Reports breathing is better, but still has excruciating pain when he moves.    Allergies: Plavix [clopidogrel bisulfate]  Medications: Prior to Admission medications   Medication Sig Start Date End Date Taking? Authorizing Provider  amLODipine (NORVASC) 5 MG tablet TAKE 1 TABLET BY MOUTH EVERY DAY Patient taking differently: Take 5 mg by mouth daily. 01/12/22  Yes Wellington Hampshire, MD  atorvastatin (LIPITOR) 20 MG tablet TAKE 1 TABLET BY MOUTH EVERY DAY Patient taking differently: Take 20 mg by mouth daily. 12/24/21  Yes Wellington Hampshire, MD  carvedilol (COREG) 6.25 MG tablet TAKE 1 TABLET BY MOUTH TWICE A DAY WITH MEALS Patient taking differently: Take 6.25 mg by mouth 2 (two) times daily with a meal. 08/03/21  Yes Copland, Frederico Hamman, MD  cilostazol (PLETAL) 50 MG tablet TAKE 1 TABLET BY MOUTH TWICE A DAY Patient taking differently: Take 50 mg by mouth 2 (two) times daily. 08/03/21  Yes Wellington Hampshire, MD  furosemide (LASIX) 40 MG tablet Take 0.5 tablets (20 mg total) by mouth as directed. Take 20 mg once daily with extra 20 mg as needed for weight gain of 3 pounds overnight or 5 pounds in one week 01/19/22  Yes Theora Gianotti, NP  levothyroxine (SYNTHROID) 150 MCG tablet [place] BREAKFAST Patient taking differently: Take 150 mcg by mouth daily before breakfast. TAKE ONE TABLET BY MOUTH EVERY DAY BEFORE BREAKFAST 08/03/21  Yes Copland, Frederico Hamman, MD  mupirocin ointment (BACTROBAN) 2 % Apply 1 Application topically daily as needed (rash). 10/20/21  Yes [provider]  triamcinolone ointment (KENALOG) 0.1 % APPLY TOPICALLY TO AFFECTED AREAS  TWICE A DAY FOR 14 DAYS THEN MAY APPLY 5 DAYS WEEKLY TO AFFECTED AREAS Patient taking differently: Apply 1 Application topically daily as needed (rash). 12/21/21  Yes Ralene Bathe, MD  warfarin (COUMADIN) 5 MG tablet Take 0.5-1 tablets (2.5-5 mg total) by mouth See admin instructions. TAKE 1/2 TABLET DAILY BY MOUTH EXCEPT TAKE 1 TABLET ON MON, WED, FRI OR AS DIRECTED ANTICOAGULATION CLINIC 03/12/21  Yes Copland, Frederico Hamman, MD     Vital Signs: BP 136/70   Pulse 91   Temp 99 F (37.2 C) (Oral)   Resp 18   Ht '5\' 9"'$  (1.753 m)   Wt 162 lb 15.8 oz (73.9 kg)   SpO2 92%   BMI 24.07 kg/m   Physical Exam Vitals reviewed.  Constitutional:      General: He is not in acute distress.    Appearance: He is not ill-appearing.  HENT:     Head: Normocephalic.  Cardiovascular:     Rate and Rhythm: Regular rhythm.  Pulmonary:     Effort: Pulmonary effort is normal.  Abdominal:     General: Abdomen is flat.     Palpations: Abdomen is soft.  Skin:    General: Skin is warm and dry.     Comments: + right chest tube. Insertion site c/d/I. Bloody fluid in the PleurEvac. No air leak   Neurological:     Mental Status: He is alert and oriented to person, place, and time.  Psychiatric:        Mood and Affect: Mood normal.  Behavior: Behavior normal.     Imaging: DG Chest Port 1 View  Result Date: 02/07/2022 CLINICAL DATA:  81 year old male with history of pneumothorax. Chest tube. EXAM: PORTABLE CHEST 1 VIEW COMPARISON:  Chest x-ray 02/06/2022. FINDINGS: Small bore chest tube with pigtail reformed projecting over the lower right hemithorax. Previously noted right-sided pneumothorax is no longer readily apparent. Lung volumes are low. Bibasilar opacities favored to reflect areas of subsegmental atelectasis. No consolidative airspace disease. No definite pleural effusions. No evidence of pulmonary edema. Heart size is normal. Upper mediastinal contours are within normal limits. Atherosclerotic  calcifications are noted in the thoracic aorta. Status post median sternotomy for CABG. Extensive subcutaneous and intramuscular gas noted throughout the thorax bilaterally tracking cephalad into the cervical regions bilaterally. IMPRESSION: 1. Resolution of previously noted right-sided pneumothorax following right-sided chest tube placement. 2. Low lung volumes with bibasilar subsegmental atelectasis. 3. Aortic atherosclerosis. Electronically Signed   By: Vinnie Langton M.D.   On: 02/07/2022 08:15   CT [facility] PLEURAL DRAIN W/INDWELL CATH W/IMG GUIDE  Result Date: 02/06/2022 INDICATION: 81 year old male with history of multifocal mildly displaced rib fractures complicated by pneumothorax and diffuse thoracic and head and neck subcutaneous emphysema. EXAM: CT PERC PLEURAL DRAIN W/INDWELL CATH W/IMG GUIDE COMPARISON:  None Available. MEDICATIONS: The patient is currently admitted to the hospital and receiving intravenous antibiotics. The antibiotics were administered within an appropriate time frame prior to the initiation of the procedure. ANESTHESIA/SEDATION: Moderate (conscious) sedation was employed during this procedure. A total of Versed 1.5 mg and Fentanyl 50 mcg was administered intravenously. Moderate Sedation Time: 20 minutes. The patient's level of consciousness and vital signs were monitored continuously by radiology nursing throughout the procedure under my direct supervision. CONTRAST:  None COMPLICATIONS: None immediate. PROCEDURE: RADIATION DOSE REDUCTION: This exam was performed according to the departmental dose-optimization program which includes automated exposure control, adjustment of the mA and/or kV according to patient size and/or use of iterative reconstruction technique. Informed written consent was obtained from the patient after a discussion of the risks, benefits and alternatives to treatment. The patient was placed supine, partially left lateral decubitus on the CT gantry and a pre  procedural CT was performed re-demonstrating the known right pneumothorax. The procedure was planned. A timeout was performed prior to the initiation of the procedure. The right posterolateral and inferior thorax was prepped and draped in the usual sterile fashion. The overlying soft tissues were anesthetized with 1% lidocaine with epinephrine. Appropriate trajectory was planned with the use of a 22 gauge spinal needle. An 18 gauge trocar needle was advanced into the pleural space and a short Amplatz super stiff wire was coiled within the pleural space. Appropriate positioning was confirmed with a limited CT scan. The tract was serially dilated allowing placement of a 14 French all-purpose drainage catheter. The tube was connected to a pleurovac and sutured in place. Limited postprocedure chest CT demonstrated complete resolution of the pneumothorax. There was significant persistent head, neck, and thoracic subcutaneous emphysema. A dressing was placed. The patient tolerated the procedure well without immediate post procedural complication. IMPRESSION: Successful CT guided placement of a 11 French all purpose drain catheter into the anterior right pleural space with complete evacuation of right pneumothorax. Ruthann Cancer, MD [facility] Electronically Signed   By: Ruthann Cancer M.D.   On: 02/06/2022 22:45   DG Chest Portable 1 View  Result Date: 02/06/2022 CLINICAL DATA:  Pneumothorax EXAM: PORTABLE CHEST 1 VIEW COMPARISON:  Chest  x-ray 02/06/2022.  CT of the chest 02/06/2022. FINDINGS: Small right pneumothorax is mildly increased from the prior x-ray no measuring 2 cm the lung apex. There is no mediastinal shift. Extensive chest wall emphysema is present in has increased now extending over the left chest and neck. There is no pleural effusion or lung consolidation. There is minimal bibasilar atelectasis, unchanged. Cardiomediastinal silhouette is  within normal limits. Patient is status post cardiac surgery. Osseous structures are unchanged. IMPRESSION: 1. Small right pneumothorax is mildly increased from the prior x-ray. 2. Extensive chest wall emphysema has increased now extending over the left chest and neck. Electronically Signed   By: Ronney Asters M.D.   On: 02/06/2022 15:25   CT ABDOMEN PELVIS W CONTRAST  Result Date: 02/06/2022 CLINICAL DATA:  Fall.  Patient fell onto a hardwood floor. EXAM: CT ABDOMEN AND PELVIS WITH CONTRAST TECHNIQUE: Multidetector CT imaging of the abdomen and pelvis was performed using the standard protocol following bolus administration of intravenous contrast. RADIATION DOSE REDUCTION: This exam was performed according to the departmental dose-optimization program which includes automated exposure control, adjustment of the mA and/or kV according to patient size and/or use of iterative reconstruction technique. CONTRAST:  179m OMNIPAQUE IOHEXOL 300 MG/ML  SOLN COMPARISON:  Current chest CT. FINDINGS: Lower chest: Right pneumothorax. Dependent right lung atelectasis. Extensive right chest subcutaneous emphysema that extends to the right abdomen, as described under the current chest CT. Hepatobiliary: Liver normal in size and attenuation. No laceration contusion. No mass. Status post cholecystectomy. No bile duct dilation. Pancreas: Normal. Spleen: No contusion or laceration.  Normal in size.  No mass. Adrenals/Urinary Tract: No adrenal mass or hemorrhage. Small left kidney with diffuse cortical thinning. Two right renal cysts, largest extending from the anterior midpole, 8.2 cm in size. Tiny low-attenuation lesions along the lower pole right kidney, also consistent with cysts. No follow-up recommended. No stones. No hydronephrosis. Normal ureters. Normal bladder. Stomach/Bowel: No stomach, bowel or mesenteric injury. Stomach is unremarkable. Small bowel and colon are normal in caliber. No wall thickening or inflammation.  Scattered left colon diverticula. Portion of the colon, at the junction of the descending and sigmoid colon, enters a left inguinal hernia without evidence obstruction, incarceration or strangulation. Vascular/Lymphatic: Irregular, overall fusiform, infrarenal abdominal aortic aneurysm, 4.5 cm anterior-posterior, 9 cm in length. No aneurysm rupture. No dissection. Well-positioned inferior vena cava filter. No enlarged lymph nodes. Reproductive: Mild prostate enlargement, 4.6 x 4.4 cm. Other: No ascites or hemoperitoneum. Musculoskeletal: Right rib fractures as described under the current chest CT. No other fractures.  No bone lesions. IMPRESSION: 1. No acute findings within the abdomen or pelvis. Specifically, no evidence injury the abdomen or pelvis. 2. Right pneumothorax, dependent right lung atelectasis and large amount right chest wall and right abdominal subcutaneous emphysema, associated with right rib fractures, as described under the current chest CT. 3. 4.5 cm infrarenal abdominal aortic aneurysm. No evidence of rupture. Recommend follow-up CT/MR every 6 months and vascular consultation. This recommendation follows ACR consensus guidelines: White Paper of the ACR Incidental Findings Committee II on Vascular Findings. J Am Coll Radiol 2013; 10:789-794. 4. Left inguinal hernia containing a small portion of the colon at the junction of the descending and sigmoid, without evidence of obstruction, incarceration or strangulation. Electronically Signed   By: DLajean ManesM.D.   On: 02/06/2022 14:12   DG Chest Portable 1 View  Result Date: 02/06/2022 CLINICAL DATA:  Fall with chest pain. EXAM: PORTABLE CHEST 1 VIEW COMPARISON:  11/09/2021  FINDINGS: A small RIGHT apical pneumothorax is noted (less than 10%. Extensive RIGHT subcutaneous emphysema is present. A fracture of the RIGHT 5th rib is present. The cardiomediastinal silhouette is unremarkable. CABG changes and LEFT subclavian stent again noted. The RIGHT  lung is clear. No pleural effusion identified. IMPRESSION: 1. Small RIGHT apical pneumothorax (less than 10%) with extensive RIGHT subcutaneous emphysema. 2. RIGHT 5th rib fracture. Critical Value/emergent results were called by telephone at the time of interpretation on 02/06/2022 at 1:39 pm to provider [facility] , who verbally acknowledged these results. Electronically Signed   By: Margarette Canada M.D.   On: 02/06/2022 13:40   CT CHEST WO CONTRAST  Result Date: 02/06/2022 CLINICAL DATA:  Fall on right side of chest EXAM: CT CHEST WITHOUT CONTRAST TECHNIQUE: Multidetector CT imaging of the chest was performed following the standard protocol without IV contrast. RADIATION DOSE REDUCTION: This exam was performed according to the departmental dose-optimization program which includes automated exposure control, adjustment of the mA and/or kV according to patient size and/or use of iterative reconstruction technique. COMPARISON:  11/09/2021 FINDINGS: Cardiovascular: Aortic atherosclerosis. Dense aortic valve calcifications. Normal heart size. Three-vessel coronary artery calcifications status post median sternotomy and CABG. No pericardial effusion. Mediastinum/Nodes: No enlarged mediastinal, hilar, or axillary lymph nodes. Thyroid gland, trachea, and esophagus demonstrate no significant findings. Lungs/Pleura: Moderate to severe centrilobular emphysema. Diffuse bilateral bronchial wall thickening. Small volume, approximately 10% right pneumothorax. Upper Abdomen: No acute abnormality. Musculoskeletal: Nondisplaced fractures of the lateral right fifth and posterior right seventh and eighth ribs. Subcutaneous emphysema about the right chest wall. Disc degenerative disease and bridging osteophytosis throughout the thoracic spine, in keeping with DISH. IMPRESSION: 1. Small volume, approximately 10% right pneumothorax. 2. Nondisplaced fractures of the overlying lateral right fifth and posterior right seventh and eighth  ribs. 3. Subcutaneous emphysema about the right chest wall. 4. Emphysema and diffuse bilateral bronchial wall thickening. 5. Coronary artery disease. 6. Dense aortic valve calcifications. Correlate for echocardiographic evidence of aortic valve dysfunction when clinically appropriate. These results were called by telephone at the time of interpretation on 02/06/2022 at 12:38 pm to Dr. Jori Moll, who verbally acknowledged these results. Aortic Atherosclerosis (ICD10-I70.0) and Emphysema (ICD10-J43.9). Electronically Signed   By: Delanna Ahmadi M.D.   On: 02/06/2022 12:46   CT Head Wo Contrast  Result Date: 02/06/2022 CLINICAL DATA:  Patient fell onto the right side.  Chest wall pain. EXAM: CT HEAD WITHOUT CONTRAST CT CERVICAL SPINE WITHOUT CONTRAST TECHNIQUE: Multidetector CT imaging of the head and cervical spine was performed following the standard protocol without intravenous contrast. Multiplanar CT image reconstructions of the cervical spine were also generated. RADIATION DOSE REDUCTION: This exam was performed according to the departmental dose-optimization program which includes automated exposure control, adjustment of the mA and/or kV according to patient size and/or use of iterative reconstruction technique. COMPARISON:  12/22/2016. FINDINGS: CT HEAD FINDINGS Brain: No evidence of acute infarction, hemorrhage, hydrocephalus, extra-axial collection or mass lesion/mass effect. Small old deep white matter lacunar infarcts, right frontal lobe. Mild chronic microvascular ischemic change. Small old infarct suggested in the inferior right cerebellum. These findings are stable. Vascular: No hyperdense vessel or unexpected calcification. Skull: Normal. Negative for fracture or focal lesion. Sinuses/Orbits: Globes and orbits are unremarkable. Sinuses are clear. Other: None. CT CERVICAL SPINE FINDINGS Alignment: Mild kyphosis, apex at C3-C4.  No spondylolisthesis. Skull base and vertebrae: No acute fracture. No  primary bone lesion or focal pathologic process. Soft tissues and spinal canal: No prevertebral fluid  or swelling. No visible canal hematoma. Disc levels: Mild loss of disc height at C2-C3. Moderate loss of disc height from C3-C4 through C6-C7. Disc bulging and endplate spurring noted at these levels. Facet degenerative changes, most evident on the right at C3-C4 and on the left at C2-C3. No convincing disc herniation. Upper chest: Subcutaneous air at the right neck base. Please refer to the patient's current CT chest for further details. Other: None. IMPRESSION: HEAD CT 1. No acute intracranial abnormalities. CERVICAL CT 1. No fracture. 2. Right neck base soft tissue air. Refer to the current chest CT for further details. Electronically Signed   By: Lajean Manes M.D.   On: 02/06/2022 12:39   CT Cervical Spine Wo Contrast  Result Date: 02/06/2022 CLINICAL DATA:  Patient fell onto the right side.  Chest wall pain. EXAM: CT HEAD WITHOUT CONTRAST CT CERVICAL SPINE WITHOUT CONTRAST TECHNIQUE: Multidetector CT imaging of the head and cervical spine was performed following the standard protocol without intravenous contrast. Multiplanar CT image reconstructions of the cervical spine were also generated. RADIATION DOSE REDUCTION: This exam was performed according to the departmental dose-optimization program which includes automated exposure control, adjustment of the mA and/or kV according to patient size and/or use of iterative reconstruction technique. COMPARISON:  12/22/2016. FINDINGS: CT HEAD FINDINGS Brain: No evidence of acute infarction, hemorrhage, hydrocephalus, extra-axial collection or mass lesion/mass effect. Small old deep white matter lacunar infarcts, right frontal lobe. Mild chronic microvascular ischemic change. Small old infarct suggested in the inferior right cerebellum. These findings are stable. Vascular: No hyperdense vessel or unexpected calcification. Skull: Normal. Negative for fracture or  focal lesion. Sinuses/Orbits: Globes and orbits are unremarkable. Sinuses are clear. Other: None. CT CERVICAL SPINE FINDINGS Alignment: Mild kyphosis, apex at C3-C4.  No spondylolisthesis. Skull base and vertebrae: No acute fracture. No primary bone lesion or focal pathologic process. Soft tissues and spinal canal: No prevertebral fluid or swelling. No visible canal hematoma. Disc levels: Mild loss of disc height at C2-C3. Moderate loss of disc height from C3-C4 through C6-C7. Disc bulging and endplate spurring noted at these levels. Facet degenerative changes, most evident on the right at C3-C4 and on the left at C2-C3. No convincing disc herniation. Upper chest: Subcutaneous air at the right neck base. Please refer to the patient's current CT chest for further details. Other: None. IMPRESSION: HEAD CT 1. No acute intracranial abnormalities. CERVICAL CT 1. No fracture. 2. Right neck base soft tissue air. Refer to the current chest CT for further details. Electronically Signed   By: Lajean Manes M.D.   On: 02/06/2022 12:39    Labs:  CBC: Recent Labs    07/05/21 0849 11/09/21 0941 02/06/22 1259 02/07/22 0320  WBC 7.3 7.7 10.3 7.6  HGB 13.5 14.2 14.6 13.7  HCT 41.3 46.7 47.2 44.3  PLT 219.0 189 237 217    COAGS: Recent Labs    03/09/21 1111 03/10/21 0000 01/13/22 0000 01/27/22 0000 02/06/22 1259 02/07/22 0320  INR 2.2*  WILL FOLLOW   < > 1.7* 2.0 2.0* 2.2*  APTT WILL FOLLOW  --   --   --   --   --    < > = values in this interval not displayed.    BMP: Recent Labs    11/09/21 0941 01/17/22 1236 02/06/22 1259 02/07/22 0320  NA 144 141 138 140  K 4.4 4.8 4.6 4.6  CL 109 104 105 106  CO2 '30 29 26 26  '$ GLUCOSE 102* 97 107*  98  BUN 29* 32* 25* 21  CALCIUM 9.1 9.4 8.8* 9.0  CREATININE 1.57* 1.69* 1.51* 1.52*  GFRNONAA 44* 41* 46* 46*    LIVER FUNCTION TESTS: Recent Labs    02/12/21 1657 03/24/21 1547 07/05/21 0849 02/06/22 1259  BILITOT 1.5* 0.7 0.7 1.0  AST '16 17 11  18  '$ ALT '11 22 7 12  '$ ALKPHOS 99 84 80 94  PROT 8.0 6.7 6.1 7.2  ALBUMIN 4.6 4.3 3.9 4.0    Assessment and Plan:  81 y.o. male who felland sustained multi right rib fx, CXR also showed worsening small right PTX, s/p chest tube placement by Dr. Serafina Royals on 10/15.    VSS CXR this AM showed resolution of PTX No air leak   PLAN - Chest tube to water seal today  - repeat CXR in AM   Further treatment plan per TRH/Trauma  Appreciate and agree with the plan.  IR to follow.    Electronically Signed: Tera Mater, PA-C 02/07/2022, 11:19 AM   I spent a total of 15 Minutes at the the patient's bedside AND on the patient's hospital floor or unit, greater than 50% of which was counseling/coordinating care for R chest tube follow up.   This chart was dictated using voice recognition software.  Despite best efforts to proofread,  errors can occur which can change the documentation meaning.

## 2022-02-07 NOTE — ED Notes (Signed)
Crepitus noted bilateral upper extremities, neck and left chest. Severe facial swelling noted, pt and family report improvement over the last hour. Respirations even and unlabored. NAD noted at this time

## 2022-02-07 NOTE — ED Notes (Signed)
ED TO INPATIENT HANDOFF REPORT  ED Nurse Name and Phone #: Josh  S Name/Age/Gender [name] 81 y.o. male Room/Bed: 003C/003C  Code Status   Code Status: Full Code  Home/SNF/Other Home Patient oriented to: self, place, time, and situation Is this baseline? Yes   Triage Complete: Triage complete  Chief Complaint Pneumothorax [J93.9]  Triage Note Pt coming from [facility] for trauma consult, fall this AM, -LOC, on Coumadin, sub q emphysema, right rib fractures and pneumothorax, swelling to neck and right eye noted, laceration to right eyebrow, abrasion to right wrist, pt has received Dilaudid, Oxycodone, Zofran, and Tetanus    Allergies Allergies  Allergen Reactions   Plavix [Clopidogrel Bisulfate]     Brain hemorrhage prev while on plavix and aspirin    Level of Care/Admitting Diagnosis ED Disposition     ED Disposition  Admit   Condition  --   [place]: [place] [[id number]]  Level of Care: Progressive [102]  Admit to Progressive based on following criteria: CARDIOVASCULAR & THORACIC of moderate stability with acute coronary syndrome symptoms/low risk myocardial infarction/hypertensive urgency/arrhythmias/heart failure potentially compromising stability and stable post cardiovascular intervention patients.  May place patient in observation at [facility] or [place] if equivalent level of care is available:: No  Covid Evaluation: Asymptomatic - no recent exposure (last 10 days) testing not required  Diagnosis: Pneumothorax [[id number]]  Admitting Physician: Orma Flaming [[id number]]  Attending Physician: Orma Flaming [[id number]]          B Medical/Surgery History Past Medical History:  Diagnosis Date   ([place]) heart failure with improved ejection fraction ([place])    a. 06/2011 Echo: EF 35-40%; b. 06/2012 Echo: EF 50%; c. 12/2016 Echo: EF 55-60%, no rwma, GRI DD, mild AS/MR, nl RV fxn, nl RVSP; d. 02/2021 Echo: EF 55%, GrII DD.  Mild MR. Mild to mod TR. Sev AS by VTI.   AAA (abdominal aortic aneurysm) ([place])    a. 05/2020 Abd u/s: 3.6cm.   Aortic calcification (HCC) 05/01/2013   Bilateral renal artery stenosis ([place])    a. 06/2013 Angio/PTA: LRA: 95 (5x18 Herculink stent), RRA 60ost; b. 04/2021 Angio: mod RRA stenosis w/ patent LRA stent.   Carotid arterial disease ([place])    a. 05/2020 Carotid U/S: RICA 2-54%, LICA 27-06%   Coronary artery disease    a. 2013 s/p CABG x 3 (LIMA->LAD, VG->OM, VG->RPDA; b. 06/2012 MV: no ischemia; c. 02/2021 Cath: LM 100, RCA 90ost, 171m free LIMA->LAD nl, VG->OM2 nl, VG->RPDA nl. Mod AS (mean grad 155mg, AVA 1.1cm^2). RHC w/ elev filling pressures.   History of prior cigarette smoking 04/11/2008   Qualifier: Diagnosis of  By: BeDiona BrownerD, Amy     Hypertension    Hypothyroidism    [place]ricular hemorrhage ([place]03/20/2014   Ischemic cardiomyopathy    a. 06/2011 Echo: EF 35-40%; b. 06/2012 Echo: EF 50%; c. 12/2016 Echo: EF 55-60%, no rwma, GRI DD; d. 02/2021 Echo: Ef 55%, GrII DD.   Moderate aortic stenosis    a. 02/2021 Echo: EF 55%, GrII DD. Sev Ca2+ of AoV. Sev AS w/ AVA by VTI 0.79cm^2; b. 02/2021 Cath: Mod AS w/ mean grad 1575m. AVA 1.1cm^2.   [place]ipheral arterial disease ([place]  a. Previous left lower extremity stenting by Dr. SanJamal Collinb. 12/2012 s/p bilat ostial common iliac stenting; c. 04/2021 Angio: Patent LRA stent, mod RRA stenosis. Small AAA. Sev plaque RSFA 2/ subtl occl of inf branch of profunda. Diff Ca2+  SFA dzs w/ collats from profunda and 3V runoff. Diffuse LSFA dzs w/ collats from profunda. Subtl PT dzs. 3V runoff-->Med Rx.   Right leg DVT ([place]) 08/13/2012   Subclavian artery stenosis, left ([place]) 05/2012   Status post stenting of the ostium and self-expanding stent placement to the left axillary artery   Venous insufficiency    Past Surgical History:  Procedure Laterality Date   ABDOMINAL ANGIOGRAM  07/10/2013   WITH BI-FEMORAL RUNOFF       DR Fletcher Anon   ABDOMINAL  AORTAGRAM N/A 12/26/2012   Procedure: ABDOMINAL Maxcine Ham;  Surgeon: Wellington Hampshire, MD;  Location: [place] CATH LAB;  Service: Cardiovascular;  Laterality: N/A;   ABDOMINAL AORTAGRAM N/A 07/10/2013   Procedure: ABDOMINAL Maxcine Ham;  Surgeon: Wellington Hampshire, MD;  Location: [place] CATH LAB;  Service: Cardiovascular;  Laterality: N/A;   ABDOMINAL AORTOGRAM W/LOWER EXTREMITY N/A 05/12/2021   Procedure: ABDOMINAL AORTOGRAM W/LOWER EXTREMITY;  Surgeon: Wellington Hampshire, MD;  Location: [facility] CV LAB;  Service: Cardiovascular;  Laterality: N/A;   ANGIOPLASTY / STENTING FEMORAL  05/2012   ANGIOPLASTY / STENTING ILIAC Bilateral 12/26/2012   ARCH AORTOGRAM N/A 06/20/2012   Procedure: ARCH AORTOGRAM;  Surgeon: Wellington Hampshire, MD;  Location: [place] CATH LAB;  Service: Cardiovascular;  Laterality: N/A;   CARDIAC CATHETERIZATION  05/2012   [place]   CHOLECYSTECTOMY  1990's   CORONARY ARTERY BYPASS GRAFT  07/11/2011   Procedure: CORONARY ARTERY BYPASS GRAFTING (CABG);  Surgeon: Grace Isaac, MD;  Location: [facility];  Service: Open Heart Surgery;  Laterality: N/A;  Times 3. On Pump. Using right greater saphenous vein and left internal mammary artery.    CORONARY ARTERY BYPASS GRAFT     HERNIA REPAIR     INSERTION OF ILIAC STENT Bilateral 12/26/2012   Procedure: INSERTION OF ILIAC STENT;  Surgeon: Wellington Hampshire, MD;  Location: [facility] CATH LAB;  Service: Cardiovascular;  Laterality: Bilateral;  Bilateral Common Iliac Artery   LEFT AND RIGHT HEART CATHETERIZATION WITH CORONARY ANGIOGRAM  07/07/2011   Procedure: LEFT AND RIGHT HEART CATHETERIZATION WITH CORONARY ANGIOGRAM;  Surgeon: Minna Merritts, MD;  Location: [facility] CATH LAB;  Service: Cardiovascular;;   LOWER EXTREMITY VENOGRAPHY Bilateral 12/30/2016   Procedure: Lower Extremity Venography;  Surgeon: Katha Cabal, MD;  Location: [facility] CV LAB;  Service: Cardiovascular;  Laterality: Bilateral;   PERCUTANEOUS STENT INTERVENTION  06/20/2012   Procedure:  PERCUTANEOUS STENT INTERVENTION;  Surgeon: Wellington Hampshire, MD;  Location: [place] CATH LAB;  Service: Cardiovascular;;   RENAL ARTERY ANGIOPLASTY Left 07/10/2013   DR ARIDA   RIGHT/LEFT HEART CATH AND CORONARY/GRAFT ANGIOGRAPHY N/A 03/15/2021   Procedure: RIGHT/LEFT HEART CATH AND CORONARY/GRAFT ANGIOGRAPHY;  Surgeon: Wellington Hampshire, MD;  Location: [place] [facility] CV LAB;  Service: Cardiovascular;  Laterality: N/A;   SUBCLAVIAN ARTERY STENT  05/2012   "2 stents" (12/26/2012)   TOOTH EXTRACTION  Spring 2017   mulitple (6)   VENA CAVA FILTER PLACEMENT  07/2012   Removable     A IV Location/Drains/Wounds Patient Lines/Drains/Airways Status     Active Line/Drains/Airways     Name Placement date Placement time Site Days   Peripheral IV 02/06/22 20 G 1" Anterior;Left Forearm 02/06/22  1258  Forearm  1   Closed System Drain 1 Lateral;Right;Anterior Chest Other (Comment) 14 Fr. 02/06/22  1813  Chest  1   Incision 07/11/11 Chest Other (Comment) 07/11/11  1022  -- 3864   Incision 07/11/11 Leg Right 07/11/11  1022  --  6967   Wound / Incision (Open or Dehisced) 02/06/22 Puncture Chest Right;Upper;Anterior;Lateral chest tube insertion site 02/06/22  1815  Chest  1            Intake/Output Last 24 hours  Intake/Output Summary (Last 24 hours) at 02/07/2022 1426 Last data filed at 02/07/2022 1116 Gross per 24 hour  Intake --  Output 39 ml  Net -39 ml    Labs/Imaging Results for orders placed or performed during the hospital encounter of 02/06/22 (from the past 48 hour(s))  Basic metabolic panel     Status: Abnormal   Collection Time: 02/07/22  3:20 AM  Result Value Ref Range   Sodium 140 135 - 145 mmol/L   Potassium 4.6 3.5 - 5.1 mmol/L   Chloride 106 98 - 111 mmol/L   CO2 26 22 - 32 mmol/L   Glucose, Bld 98 70 - 99 mg/dL    Comment: Glucose reference range applies only to samples taken after fasting for at least 8 hours.   BUN 21 8 - 23 mg/dL   Creatinine, Ser 1.52 (H) 0.61 - 1.24  mg/dL   Calcium 9.0 8.9 - 10.3 mg/dL   GFR, Estimated 46 (L) >60 mL/min    Comment: (NOTE) Calculated using the CKD-EPI Creatinine Equation (2021)    Anion gap 8 5 - 15    Comment: Performed at [facility] [address], [place], [place] [address]  CBC     Status: None   Collection Time: 02/07/22  3:20 AM  Result Value Ref Range   WBC 7.6 4.0 - 10.5 K/uL   RBC 4.83 4.22 - 5.81 MIL/uL   Hemoglobin 13.7 13.0 - 17.0 g/dL   HCT 44.3 39.0 - 52.0 %   MCV 91.7 80.0 - 100.0 fL   MCH 28.4 26.0 - 34.0 pg   MCHC 30.9 30.0 - 36.0 g/dL   RDW 14.8 11.5 - 15.5 %   Platelets 217 150 - 400 K/uL   nRBC 0.0 0.0 - 0.2 %    Comment: Performed at [facility], [place] [address] [place], [place] [address]  Protime-INR     Status: Abnormal   Collection Time: 02/07/22  3:20 AM  Result Value Ref Range   Prothrombin Time 24.5 (H) 11.4 - 15.2 seconds   INR 2.2 (H) 0.8 - 1.2    Comment: (NOTE) INR goal varies based on device and disease states. Performed at [facility], [place] [address] [place], [place] [address]    DG Chest Port 1 View  Result Date: 02/07/2022 CLINICAL DATA:  81 year old male with history of pneumothorax. Chest tube. EXAM: PORTABLE CHEST 1 VIEW COMPARISON:  Chest x-ray 02/06/2022. FINDINGS: Small bore chest tube with pigtail reformed projecting over the lower right hemithorax. Previously noted right-sided pneumothorax is no longer readily apparent. Lung volumes are low. Bibasilar opacities favored to reflect areas of subsegmental atelectasis. No consolidative airspace disease. No definite pleural effusions. No evidence of pulmonary edema. Heart size is normal. Upper mediastinal contours are within normal limits. Atherosclerotic calcifications are noted in the thoracic aorta. Status post median sternotomy for CABG. Extensive subcutaneous and intramuscular gas noted throughout the thorax bilaterally tracking cephalad into the cervical regions bilaterally. IMPRESSION: 1.  Resolution of previously noted right-sided pneumothorax following right-sided chest tube placement. 2. Low lung volumes with bibasilar subsegmental atelectasis. 3. Aortic atherosclerosis. Electronically Signed   By: Vinnie Langton M.D.   On: 02/07/2022 08:15   CT [facility] PLEURAL DRAIN W/INDWELL  CATH W/IMG GUIDE  Result Date: 02/06/2022 INDICATION: 81 year old male with history of multifocal mildly displaced rib fractures complicated by pneumothorax and diffuse thoracic and head and neck subcutaneous emphysema. EXAM: CT PERC PLEURAL DRAIN W/INDWELL CATH W/IMG GUIDE COMPARISON:  None Available. MEDICATIONS: The patient is currently admitted to the hospital and receiving intravenous antibiotics. The antibiotics were administered within an appropriate time frame prior to the initiation of the procedure. ANESTHESIA/SEDATION: Moderate (conscious) sedation was employed during this procedure. A total of Versed 1.5 mg and Fentanyl 50 mcg was administered intravenously. Moderate Sedation Time: 20 minutes. The patient's level of consciousness and vital signs were monitored continuously by radiology nursing throughout the procedure under my direct supervision. CONTRAST:  None COMPLICATIONS: None immediate. PROCEDURE: RADIATION DOSE REDUCTION: This exam was performed according to the departmental dose-optimization program which includes automated exposure control, adjustment of the mA and/or kV according to patient size and/or use of iterative reconstruction technique. Informed written consent was obtained from the patient after a discussion of the risks, benefits and alternatives to treatment. The patient was placed supine, partially left lateral decubitus on the CT gantry and a pre procedural CT was performed re-demonstrating the known right pneumothorax. The procedure was planned. A timeout was performed prior to the initiation of the procedure. The right posterolateral and inferior thorax was prepped and draped in the  usual sterile fashion. The overlying soft tissues were anesthetized with 1% lidocaine with epinephrine. Appropriate trajectory was planned with the use of a 22 gauge spinal needle. An 18 gauge trocar needle was advanced into the pleural space and a short Amplatz super stiff wire was coiled within the pleural space. Appropriate positioning was confirmed with a limited CT scan. The tract was serially dilated allowing placement of a 14 French all-purpose drainage catheter. The tube was connected to a pleurovac and sutured in place. Limited postprocedure chest CT demonstrated complete resolution of the pneumothorax. There was significant persistent head, neck, and thoracic subcutaneous emphysema. A dressing was placed. The patient tolerated the procedure well without immediate post procedural complication. IMPRESSION: Successful CT guided placement of a 86 French all purpose drain catheter into the anterior right pleural space with complete evacuation of right pneumothorax. Ruthann Cancer, MD [facility]iology Electronically Signed   By: Ruthann Cancer M.D.   On: 02/06/2022 22:45   DG Chest Portable 1 View  Result Date: 02/06/2022 CLINICAL DATA:  Pneumothorax EXAM: PORTABLE CHEST 1 VIEW COMPARISON:  Chest x-ray 02/06/2022.  CT of the chest 02/06/2022. FINDINGS: Small right pneumothorax is mildly increased from the prior x-ray no measuring 2 cm the lung apex. There is no mediastinal shift. Extensive chest wall emphysema is present in has increased now extending over the left chest and neck. There is no pleural effusion or lung consolidation. There is minimal bibasilar atelectasis, unchanged. Cardiomediastinal silhouette is within normal limits. Patient is status post cardiac surgery. Osseous structures are unchanged. IMPRESSION: 1. Small right pneumothorax is mildly increased from the prior x-ray. 2. Extensive chest wall emphysema has increased now extending over the  left chest and neck. Electronically Signed   By: Ronney Asters M.D.   On: 02/06/2022 15:25   CT ABDOMEN PELVIS W CONTRAST  Result Date: 02/06/2022 CLINICAL DATA:  Fall.  Patient fell onto a hardwood floor. EXAM: CT ABDOMEN AND PELVIS WITH CONTRAST TECHNIQUE: Multidetector CT imaging of the abdomen and pelvis was performed using the standard protocol following bolus administration of intravenous contrast. RADIATION DOSE REDUCTION: This exam  was performed according to the departmental dose-optimization program which includes automated exposure control, adjustment of the mA and/or kV according to patient size and/or use of iterative reconstruction technique. CONTRAST:  18m OMNIPAQUE IOHEXOL 300 MG/ML  SOLN COMPARISON:  Current chest CT. FINDINGS: Lower chest: Right pneumothorax. Dependent right lung atelectasis. Extensive right chest subcutaneous emphysema that extends to the right abdomen, as described under the current chest CT. Hepatobiliary: Liver normal in size and attenuation. No laceration contusion. No mass. Status post cholecystectomy. No bile duct dilation. Pancreas: Normal. Spleen: No contusion or laceration.  Normal in size.  No mass. Adrenals/Urinary Tract: No adrenal mass or hemorrhage. Small left kidney with diffuse cortical thinning. Two right renal cysts, largest extending from the anterior midpole, 8.2 cm in size. Tiny low-attenuation lesions along the lower pole right kidney, also consistent with cysts. No follow-up recommended. No stones. No hydronephrosis. Normal ureters. Normal bladder. Stomach/Bowel: No stomach, bowel or mesenteric injury. Stomach is unremarkable. Small bowel and colon are normal in caliber. No wall thickening or inflammation. Scattered left colon diverticula. Portion of the colon, at the junction of the descending and sigmoid colon, enters a left inguinal hernia without evidence obstruction, incarceration or strangulation. Vascular/Lymphatic: Irregular, overall fusiform,  infrarenal abdominal aortic aneurysm, 4.5 cm anterior-posterior, 9 cm in length. No aneurysm rupture. No dissection. Well-positioned inferior vena cava filter. No enlarged lymph nodes. Reproductive: Mild prostate enlargement, 4.6 x 4.4 cm. Other: No ascites or hemoperitoneum. Musculoskeletal: Right rib fractures as described under the current chest CT. No other fractures.  No bone lesions. IMPRESSION: 1. No acute findings within the abdomen or pelvis. Specifically, no evidence injury the abdomen or pelvis. 2. Right pneumothorax, dependent right lung atelectasis and large amount right chest wall and right abdominal subcutaneous emphysema, associated with right rib fractures, as described under the current chest CT. 3. 4.5 cm infrarenal abdominal aortic aneurysm. No evidence of rupture. Recommend follow-up CT/MR every 6 months and vascular consultation. This recommendation follows ACR consensus guidelines: White Paper of the ACR Incidental Findings Committee II on Vascular Findings. J Am Coll Radiol 2013; 10:789-794. 4. Left inguinal hernia containing a small portion of the colon at the junction of the descending and sigmoid, without evidence of obstruction, incarceration or strangulation. Electronically Signed   By: DLajean ManesM.D.   On: 02/06/2022 14:12   DG Chest Portable 1 View  Result Date: 02/06/2022 CLINICAL DATA:  Fall with chest pain. EXAM: PORTABLE CHEST 1 VIEW COMPARISON:  11/09/2021 FINDINGS: A small RIGHT apical pneumothorax is noted (less than 10%. Extensive RIGHT subcutaneous emphysema is present. A fracture of the RIGHT 5th rib is present. The cardiomediastinal silhouette is unremarkable. CABG changes and LEFT subclavian stent again noted. The RIGHT lung is clear. No pleural effusion identified. IMPRESSION: 1. Small RIGHT apical pneumothorax (less than 10%) with extensive RIGHT subcutaneous emphysema. 2. RIGHT 5th rib fracture. Critical Value/emergent results were called by telephone at the  time of interpretation on 02/06/2022 at 1:39 pm [facility], who verbally acknowledged these results. Electronically Signed   By: JMargarette CanadaM.D.   On: 02/06/2022 13:40   CT CHEST WO CONTRAST  Result Date: 02/06/2022 CLINICAL DATA:  Fall on right side of chest EXAM: CT CHEST WITHOUT CONTRAST TECHNIQUE: Multidetector CT imaging of the chest was performed following the standard protocol without IV contrast. RADIATION DOSE REDUCTION: This exam was performed according to the departmental dose-optimization program which includes automated exposure control, adjustment of the mA and/or kV according to  patient size and/or use of iterative reconstruction technique. COMPARISON:  11/09/2021 FINDINGS: Cardiovascular: Aortic atherosclerosis. Dense aortic valve calcifications. Normal heart size. Three-vessel coronary artery calcifications status post median sternotomy and CABG. No pericardial effusion. Mediastinum/Nodes: No enlarged mediastinal, hilar, or axillary lymph nodes. Thyroid gland, trachea, and esophagus demonstrate no significant findings. Lungs/Pleura: Moderate to severe centrilobular emphysema. Diffuse bilateral bronchial wall thickening. Small volume, approximately 10% right pneumothorax. Upper Abdomen: No acute abnormality. Musculoskeletal: Nondisplaced fractures of the lateral right fifth and posterior right seventh and eighth ribs. Subcutaneous emphysema about the right chest wall. Disc degenerative disease and bridging osteophytosis throughout the thoracic spine, in keeping with DISH. IMPRESSION: 1. Small volume, approximately 10% right pneumothorax. 2. Nondisplaced fractures of the overlying lateral right fifth and posterior right seventh and eighth ribs. 3. Subcutaneous emphysema about the right chest wall. 4. Emphysema and diffuse bilateral bronchial wall thickening. 5. Coronary artery disease. 6. Dense aortic valve calcifications. Correlate for echocardiographic evidence of aortic valve  dysfunction when clinically appropriate. These results were called by telephone at the time of interpretation on 02/06/2022 at 12:38 pm to Dr. Jori Moll, who verbally acknowledged these results. Aortic Atherosclerosis (ICD10-I70.0) and Emphysema (ICD10-J43.9). Electronically Signed   By: Delanna Ahmadi M.D.   On: 02/06/2022 12:46   CT Head Wo Contrast  Result Date: 02/06/2022 CLINICAL DATA:  Patient fell onto the right side.  Chest wall pain. EXAM: CT HEAD WITHOUT CONTRAST CT CERVICAL SPINE WITHOUT CONTRAST TECHNIQUE: Multidetector CT imaging of the head and cervical spine was performed following the standard protocol without intravenous contrast. Multiplanar CT image reconstructions of the cervical spine were also generated. RADIATION DOSE REDUCTION: This exam was performed according to the departmental dose-optimization program which includes automated exposure control, adjustment of the mA and/or kV according to patient size and/or use of iterative reconstruction technique. COMPARISON:  12/22/2016. FINDINGS: CT HEAD FINDINGS Brain: No evidence of acute infarction, hemorrhage, hydrocephalus, extra-axial collection or mass lesion/mass effect. Small old deep white matter lacunar infarcts, right frontal lobe. Mild chronic microvascular ischemic change. Small old infarct suggested in the inferior right cerebellum. These findings are stable. Vascular: No hyperdense vessel or unexpected calcification. Skull: Normal. Negative for fracture or focal lesion. Sinuses/Orbits: Globes and orbits are unremarkable. Sinuses are clear. Other: None. CT CERVICAL SPINE FINDINGS Alignment: Mild kyphosis, apex at C3-C4.  No spondylolisthesis. Skull base and vertebrae: No acute fracture. No primary bone lesion or focal pathologic process. Soft tissues and spinal canal: No prevertebral fluid or swelling. No visible canal hematoma. Disc levels: Mild loss of disc height at C2-C3. Moderate loss of disc height from C3-C4 through C6-C7. Disc  bulging and endplate spurring noted at these levels. Facet degenerative changes, most evident on the right at C3-C4 and on the left at C2-C3. No convincing disc herniation. Upper chest: Subcutaneous air at the right neck base. Please refer to the patient's current CT chest for further details. Other: None. IMPRESSION: HEAD CT 1. No acute intracranial abnormalities. CERVICAL CT 1. No fracture. 2. Right neck base soft tissue air. Refer to the current chest CT for further details. Electronically Signed   By: Lajean Manes M.D.   On: 02/06/2022 12:39   CT Cervical Spine Wo Contrast  Result Date: 02/06/2022 CLINICAL DATA:  Patient fell onto the right side.  Chest wall pain. EXAM: CT HEAD WITHOUT CONTRAST CT CERVICAL SPINE WITHOUT CONTRAST TECHNIQUE: Multidetector CT imaging of the head and cervical spine was performed following the standard protocol without intravenous contrast. Multiplanar CT image reconstructions  of the cervical spine were also generated. RADIATION DOSE REDUCTION: This exam was performed according to the departmental dose-optimization program which includes automated exposure control, adjustment of the mA and/or kV according to patient size and/or use of iterative reconstruction technique. COMPARISON:  12/22/2016. FINDINGS: CT HEAD FINDINGS Brain: No evidence of acute infarction, hemorrhage, hydrocephalus, extra-axial collection or mass lesion/mass effect. Small old deep white matter lacunar infarcts, right frontal lobe. Mild chronic microvascular ischemic change. Small old infarct suggested in the inferior right cerebellum. These findings are stable. Vascular: No hyperdense vessel or unexpected calcification. Skull: Normal. Negative for fracture or focal lesion. Sinuses/Orbits: Globes and orbits are unremarkable. Sinuses are clear. Other: None. CT CERVICAL SPINE FINDINGS Alignment: Mild kyphosis, apex at C3-C4.  No spondylolisthesis. Skull base and vertebrae: No acute fracture. No primary bone  lesion or focal pathologic process. Soft tissues and spinal canal: No prevertebral fluid or swelling. No visible canal hematoma. Disc levels: Mild loss of disc height at C2-C3. Moderate loss of disc height from C3-C4 through C6-C7. Disc bulging and endplate spurring noted at these levels. Facet degenerative changes, most evident on the right at C3-C4 and on the left at C2-C3. No convincing disc herniation. Upper chest: Subcutaneous air at the right neck base. Please refer to the patient's current CT chest for further details. Other: None. IMPRESSION: HEAD CT 1. No acute intracranial abnormalities. CERVICAL CT 1. No fracture. 2. Right neck base soft tissue air. Refer to the current chest CT for further details. Electronically Signed   By: Lajean Manes M.D.   On: 02/06/2022 12:39    Pending Labs Unresulted Labs (From admission, onward)     Start     Ordered   02/08/22 0500  Comprehensive metabolic panel  Tomorrow morning,   R        02/07/22 0954   02/08/22 0500  CBC  Tomorrow morning,   R        02/07/22 0954   02/08/22 0500  Magnesium  Tomorrow morning,   R        02/07/22 0954   02/08/22 0500  Phosphorus  Tomorrow morning,   R        02/07/22 0954   02/07/22 0500  Protime-INR  Daily at 5am,   R      02/06/22 1805            Vitals/Pain Today's Vitals   02/07/22 1027 02/07/22 1115 02/07/22 1119 02/07/22 1130  BP:  136/70    Pulse:  91    Resp:  18    Temp: 99.3 F (37.4 C)  99 F (37.2 C)   TempSrc: Oral  Oral   SpO2:  92%    Weight:      Height:      PainSc:    5     Isolation Precautions No active isolations  Medications Medications  acetaminophen (TYLENOL) tablet 650 mg (650 mg Oral Given 02/07/22 0942)    Or  acetaminophen (TYLENOL) suppository 650 mg ( Rectal See Alternative 02/07/22 0942)  morphine (PF) 2 MG/ML injection 2 mg (2 mg Intravenous Given 02/07/22 0754)  methocarbamol (ROBAXIN) 500 mg in dextrose 5 % 50 mL IVPB (has no administration in time range)   ondansetron (ZOFRAN) tablet 4 mg (has no administration in time range)    Or  ondansetron (ZOFRAN) injection 4 mg (has no administration in time range)  sodium chloride flush (NS) 0.9 % injection 10 mL (10 mLs Intrapleural Given 02/07/22 0755)  amLODipine (  NORVASC) tablet 5 mg (5 mg Oral Given 02/07/22 0944)  atorvastatin (LIPITOR) tablet 20 mg (20 mg Oral Given 02/07/22 0944)  carvedilol (COREG) tablet 6.25 mg (6.25 mg Oral Given 02/07/22 0944)  levothyroxine (SYNTHROID) tablet 150 mcg (150 mcg Oral Given 02/07/22 0535)  cilostazol (PLETAL) tablet 50 mg (50 mg Oral Given 02/07/22 0940)  triamcinolone ointment (KENALOG) 0.1 % 1 Application (has no administration in time range)  Warfarin - Pharmacist Dosing Inpatient (has no administration in time range)  furosemide (LASIX) tablet 20 mg (20 mg Oral Not Given 02/07/22 1020)  furosemide (LASIX) tablet 20 mg (20 mg Oral Given 02/07/22 0944)  warfarin (COUMADIN) tablet 5 mg (has no administration in time range)  lidocaine (LIDODERM) 5 % 1 patch (1 patch Transdermal Patch Applied 02/07/22 0940)  oxyCODONE (Oxy IR/ROXICODONE) immediate release tablet 5-10 mg (10 mg Oral Given 02/07/22 0944)  ondansetron (ZOFRAN) injection 4 mg (4 mg Intravenous Given 02/06/22 1612)  midazolam (VERSED) injection (0.5 mg Intravenous Given 02/06/22 1802)  fentaNYL (SUBLIMAZE) injection (25 mcg Intravenous Given 02/06/22 1802)    Mobility non-ambulatory High fall risk   Focused Assessments Pulmonary Assessment Handoff:  Lung sounds: Bilateral Breath Sounds: Diminished L Breath Sounds: Fine crackles, Diminished R Breath Sounds: Diminished, Fine crackles O2 Device: Nasal Cannula O2 Flow Rate (L/min): 4 L/min    R Recommendations: See Admitting Provider Note  Report given to:   Additional Notes:

## 2022-02-07 NOTE — ED Notes (Signed)
Pt is eating lunch tray.

## 2022-02-07 NOTE — Progress Notes (Addendum)
Subjective: Patient doesn't feel well at all today.  C/o significant facial and chest edema.  No other pain anywhere else except left chest from tube and rib fxs  ROS: See above, otherwise other systems negative  Objective: Vital signs in last 24 hours: Temp:  [97.5 F (36.4 C)-98.5 F (36.9 C)] 98.2 F (36.8 C) (10/16 0540) Pulse Rate:  [57-95] 87 (10/16 0715) Resp:  [13-27] 25 (10/16 0715) BP: (101-150)/(57-109) 148/72 (10/16 0715) SpO2:  [93 %-100 %] 93 % (10/16 0715) FiO2 (%):  [100 %] 100 % (10/15 1405) Weight:  [73.9 kg] 73.9 kg (10/15 1508)    Intake/Output from previous day: No intake/output data recorded. Intake/Output this shift: No intake/output data recorded.  PE: Gen: doesn't appear to feel well HEENT: significant facial edema, periorbital edema, PERRL Heart: regular Lungs: diffuse wheezing.  Chest wall with crepitus, down B arms into hands, up neck, and into face.  No airleak noted on his CT.  On suction Abd: soft, NT, ND Ext: MAE, B upper arms with crepitus down to hands Psych: A&Ox3     Lab Results:  Recent Labs    02/06/22 1259 02/07/22 0320  WBC 10.3 7.6  HGB 14.6 13.7  HCT 47.2 44.3  PLT 237 217   BMET Recent Labs    02/06/22 1259 02/07/22 0320  NA 138 140  K 4.6 4.6  CL 105 106  CO2 26 26  GLUCOSE 107* 98  BUN 25* 21  CREATININE 1.51* 1.52*  CALCIUM 8.8* 9.0   PT/INR Recent Labs    02/06/22 1259 02/07/22 0320  LABPROT 22.6* 24.5*  INR 2.0* 2.2*   CMP     Component Value Date/Time   NA 140 02/07/2022 0320   NA 142 05/06/2021 0953   NA 136 06/03/2013 0454   K 4.6 02/07/2022 0320   K 4.1 06/03/2013 0454   CL 106 02/07/2022 0320   CL 106 06/03/2013 0454   CO2 26 02/07/2022 0320   CO2 27 06/03/2013 0454   GLUCOSE 98 02/07/2022 0320   GLUCOSE 80 06/03/2013 0454   BUN 21 02/07/2022 0320   BUN 22 05/06/2021 0953   BUN 22 (H) 06/03/2013 0454   CREATININE 1.52 (H) 02/07/2022 0320   CREATININE 1.59 (H) 06/03/2013  0454   CREATININE 1.02 04/22/2011 1507   CALCIUM 9.0 02/07/2022 0320   CALCIUM 8.8 06/03/2013 0454   PROT 7.2 02/06/2022 1259   PROT 6.5 01/31/2014 0756   PROT 6.7 06/03/2013 0454   ALBUMIN 4.0 02/06/2022 1259   ALBUMIN 4.2 01/31/2014 0756   ALBUMIN 2.1 (L) 06/03/2013 0454   AST 18 02/06/2022 1259   AST 34 06/03/2013 0454   ALT 12 02/06/2022 1259   ALT 52 06/03/2013 0454   ALKPHOS 94 02/06/2022 1259   ALKPHOS 97 06/03/2013 0454   BILITOT 1.0 02/06/2022 1259   BILITOT 0.6 06/03/2013 0454   GFRNONAA 46 (L) 02/07/2022 0320   GFRNONAA 43 (L) 06/03/2013 0454   GFRAA 59 (L) 12/30/2016 0103   GFRAA 50 (L) 06/03/2013 0454   Lipase     Component Value Date/Time   LIPASE 55 (H) 02/06/2022 1259       Studies/Results: DG Chest Port 1 View  Result Date: 02/07/2022 CLINICAL DATA:  81 year old male with history of pneumothorax. Chest tube. EXAM: PORTABLE CHEST 1 VIEW COMPARISON:  Chest x-ray 02/06/2022. FINDINGS: Small bore chest tube with pigtail reformed projecting over the lower right hemithorax. Previously noted right-sided pneumothorax is no longer  readily apparent. Lung volumes are low. Bibasilar opacities favored to reflect areas of subsegmental atelectasis. No consolidative airspace disease. No definite pleural effusions. No evidence of pulmonary edema. Heart size is normal. Upper mediastinal contours are within normal limits. Atherosclerotic calcifications are noted in the thoracic aorta. Status post median sternotomy for CABG. Extensive subcutaneous and intramuscular gas noted throughout the thorax bilaterally tracking cephalad into the cervical regions bilaterally. IMPRESSION: 1. Resolution of previously noted right-sided pneumothorax following right-sided chest tube placement. 2. Low lung volumes with bibasilar subsegmental atelectasis. 3. Aortic atherosclerosis. Electronically Signed   By: Vinnie Langton M.D.   On: 02/07/2022 08:15   CT [facility] PLEURAL DRAIN W/INDWELL CATH W/IMG  GUIDE  Result Date: 02/06/2022 INDICATION: 81 year old male with history of multifocal mildly displaced rib fractures complicated by pneumothorax and diffuse thoracic and head and neck subcutaneous emphysema. EXAM: CT PERC PLEURAL DRAIN W/INDWELL CATH W/IMG GUIDE COMPARISON:  None Available. MEDICATIONS: The patient is currently admitted to the hospital and receiving intravenous antibiotics. The antibiotics were administered within an appropriate time frame prior to the initiation of the procedure. ANESTHESIA/SEDATION: Moderate (conscious) sedation was employed during this procedure. A total of Versed 1.5 mg and Fentanyl 50 mcg was administered intravenously. Moderate Sedation Time: 20 minutes. The patient's level of consciousness and vital signs were monitored continuously by radiology nursing throughout the procedure under my direct supervision. CONTRAST:  None COMPLICATIONS: None immediate. PROCEDURE: RADIATION DOSE REDUCTION: This exam was performed according to the departmental dose-optimization program which includes automated exposure control, adjustment of the mA and/or kV according to patient size and/or use of iterative reconstruction technique. Informed written consent was obtained from the patient after a discussion of the risks, benefits and alternatives to treatment. The patient was placed supine, partially left lateral decubitus on the CT gantry and a pre procedural CT was performed re-demonstrating the known right pneumothorax. The procedure was planned. A timeout was performed prior to the initiation of the procedure. The right posterolateral and inferior thorax was prepped and draped in the usual sterile fashion. The overlying soft tissues were anesthetized with 1% lidocaine with epinephrine. Appropriate trajectory was planned with the use of a 22 gauge spinal needle. An 18 gauge trocar needle was advanced into the pleural space and a short Amplatz super stiff wire was coiled within the pleural  space. Appropriate positioning was confirmed with a limited CT scan. The tract was serially dilated allowing placement of a 14 French all-purpose drainage catheter. The tube was connected to a pleurovac and sutured in place. Limited postprocedure chest CT demonstrated complete resolution of the pneumothorax. There was significant persistent head, neck, and thoracic subcutaneous emphysema. A dressing was placed. The patient tolerated the procedure well without immediate post procedural complication. IMPRESSION: Successful CT guided placement of a 13 French all purpose drain catheter into the anterior right pleural space with complete evacuation of right pneumothorax. Ruthann Cancer, MD [facility] Electronically Signed   By: Ruthann Cancer M.D.   On: 02/06/2022 22:45   DG Chest Portable 1 View  Result Date: 02/06/2022 CLINICAL DATA:  Pneumothorax EXAM: PORTABLE CHEST 1 VIEW COMPARISON:  Chest x-ray 02/06/2022.  CT of the chest 02/06/2022. FINDINGS: Small right pneumothorax is mildly increased from the prior x-ray no measuring 2 cm the lung apex. There is no mediastinal shift. Extensive chest wall emphysema is present in has increased now extending over the left chest and neck. There is no pleural effusion or lung consolidation. There is minimal  bibasilar atelectasis, unchanged. Cardiomediastinal silhouette is within normal limits. Patient is status post cardiac surgery. Osseous structures are unchanged. IMPRESSION: 1. Small right pneumothorax is mildly increased from the prior x-ray. 2. Extensive chest wall emphysema has increased now extending over the left chest and neck. Electronically Signed   By: Ronney Asters M.D.   On: 02/06/2022 15:25   CT ABDOMEN PELVIS W CONTRAST  Result Date: 02/06/2022 CLINICAL DATA:  Fall.  Patient fell onto a hardwood floor. EXAM: CT ABDOMEN AND PELVIS WITH CONTRAST TECHNIQUE: Multidetector CT imaging of the abdomen and  pelvis was performed using the standard protocol following bolus administration of intravenous contrast. RADIATION DOSE REDUCTION: This exam was performed according to the departmental dose-optimization program which includes automated exposure control, adjustment of the mA and/or kV according to patient size and/or use of iterative reconstruction technique. CONTRAST:  147m OMNIPAQUE IOHEXOL 300 MG/ML  SOLN COMPARISON:  Current chest CT. FINDINGS: Lower chest: Right pneumothorax. Dependent right lung atelectasis. Extensive right chest subcutaneous emphysema that extends to the right abdomen, as described under the current chest CT. Hepatobiliary: Liver normal in size and attenuation. No laceration contusion. No mass. Status post cholecystectomy. No bile duct dilation. Pancreas: Normal. Spleen: No contusion or laceration.  Normal in size.  No mass. Adrenals/Urinary Tract: No adrenal mass or hemorrhage. Small left kidney with diffuse cortical thinning. Two right renal cysts, largest extending from the anterior midpole, 8.2 cm in size. Tiny low-attenuation lesions along the lower pole right kidney, also consistent with cysts. No follow-up recommended. No stones. No hydronephrosis. Normal ureters. Normal bladder. Stomach/Bowel: No stomach, bowel or mesenteric injury. Stomach is unremarkable. Small bowel and colon are normal in caliber. No wall thickening or inflammation. Scattered left colon diverticula. Portion of the colon, at the junction of the descending and sigmoid colon, enters a left inguinal hernia without evidence obstruction, incarceration or strangulation. Vascular/Lymphatic: Irregular, overall fusiform, infrarenal abdominal aortic aneurysm, 4.5 cm anterior-posterior, 9 cm in length. No aneurysm rupture. No dissection. Well-positioned inferior vena cava filter. No enlarged lymph nodes. Reproductive: Mild prostate enlargement, 4.6 x 4.4 cm. Other: No ascites or hemoperitoneum. Musculoskeletal: Right rib  fractures as described under the current chest CT. No other fractures.  No bone lesions. IMPRESSION: 1. No acute findings within the abdomen or pelvis. Specifically, no evidence injury the abdomen or pelvis. 2. Right pneumothorax, dependent right lung atelectasis and large amount right chest wall and right abdominal subcutaneous emphysema, associated with right rib fractures, as described under the current chest CT. 3. 4.5 cm infrarenal abdominal aortic aneurysm. No evidence of rupture. Recommend follow-up CT/MR every 6 months and vascular consultation. This recommendation follows ACR consensus guidelines: White Paper of the ACR Incidental Findings Committee II on Vascular Findings. J Am Coll Radiol 2013; 10:789-794. 4. Left inguinal hernia containing a small portion of the colon at the junction of the descending and sigmoid, without evidence of obstruction, incarceration or strangulation. Electronically Signed   By: DLajean ManesM.D.   On: 02/06/2022 14:12   DG Chest Portable 1 View  Result Date: 02/06/2022 CLINICAL DATA:  Fall with chest pain. EXAM: PORTABLE CHEST 1 VIEW COMPARISON:  11/09/2021 FINDINGS: A small RIGHT apical pneumothorax is noted (less than 10%. Extensive RIGHT subcutaneous emphysema is present. A fracture of the RIGHT 5th rib is present. The cardiomediastinal silhouette is unremarkable. CABG changes and LEFT subclavian stent again noted. The RIGHT lung is clear. No pleural effusion identified. IMPRESSION: 1. Small RIGHT apical pneumothorax (less than 10%) with extensive  RIGHT subcutaneous emphysema. 2. RIGHT 5th rib fracture. Critical Value/emergent results were called by telephone at the time of interpretation on 02/06/2022 at 1:39 pm to provider [facility] , who verbally acknowledged these results. Electronically Signed   By: Margarette Canada M.D.   On: 02/06/2022 13:40   CT CHEST WO CONTRAST  Result Date: 02/06/2022 CLINICAL DATA:  Fall on right side of chest EXAM: CT CHEST WITHOUT  CONTRAST TECHNIQUE: Multidetector CT imaging of the chest was performed following the standard protocol without IV contrast. RADIATION DOSE REDUCTION: This exam was performed according to the departmental dose-optimization program which includes automated exposure control, adjustment of the mA and/or kV according to patient size and/or use of iterative reconstruction technique. COMPARISON:  11/09/2021 FINDINGS: Cardiovascular: Aortic atherosclerosis. Dense aortic valve calcifications. Normal heart size. Three-vessel coronary artery calcifications status post median sternotomy and CABG. No pericardial effusion. Mediastinum/Nodes: No enlarged mediastinal, hilar, or axillary lymph nodes. Thyroid gland, trachea, and esophagus demonstrate no significant findings. Lungs/Pleura: Moderate to severe centrilobular emphysema. Diffuse bilateral bronchial wall thickening. Small volume, approximately 10% right pneumothorax. Upper Abdomen: No acute abnormality. Musculoskeletal: Nondisplaced fractures of the lateral right fifth and posterior right seventh and eighth ribs. Subcutaneous emphysema about the right chest wall. Disc degenerative disease and bridging osteophytosis throughout the thoracic spine, in keeping with DISH. IMPRESSION: 1. Small volume, approximately 10% right pneumothorax. 2. Nondisplaced fractures of the overlying lateral right fifth and posterior right seventh and eighth ribs. 3. Subcutaneous emphysema about the right chest wall. 4. Emphysema and diffuse bilateral bronchial wall thickening. 5. Coronary artery disease. 6. Dense aortic valve calcifications. Correlate for echocardiographic evidence of aortic valve dysfunction when clinically appropriate. These results were called by telephone at the time of interpretation on 02/06/2022 at 12:38 pm to Dr. Jori Moll, who verbally acknowledged these results. Aortic Atherosclerosis (ICD10-I70.0) and Emphysema (ICD10-J43.9). Electronically Signed   By: Delanna Ahmadi M.D.    On: 02/06/2022 12:46   CT Head Wo Contrast  Result Date: 02/06/2022 CLINICAL DATA:  Patient fell onto the right side.  Chest wall pain. EXAM: CT HEAD WITHOUT CONTRAST CT CERVICAL SPINE WITHOUT CONTRAST TECHNIQUE: Multidetector CT imaging of the head and cervical spine was performed following the standard protocol without intravenous contrast. Multiplanar CT image reconstructions of the cervical spine were also generated. RADIATION DOSE REDUCTION: This exam was performed according to the departmental dose-optimization program which includes automated exposure control, adjustment of the mA and/or kV according to patient size and/or use of iterative reconstruction technique. COMPARISON:  12/22/2016. FINDINGS: CT HEAD FINDINGS Brain: No evidence of acute infarction, hemorrhage, hydrocephalus, extra-axial collection or mass lesion/mass effect. Small old deep white matter lacunar infarcts, right frontal lobe. Mild chronic microvascular ischemic change. Small old infarct suggested in the inferior right cerebellum. These findings are stable. Vascular: No hyperdense vessel or unexpected calcification. Skull: Normal. Negative for fracture or focal lesion. Sinuses/Orbits: Globes and orbits are unremarkable. Sinuses are clear. Other: None. CT CERVICAL SPINE FINDINGS Alignment: Mild kyphosis, apex at C3-C4.  No spondylolisthesis. Skull base and vertebrae: No acute fracture. No primary bone lesion or focal pathologic process. Soft tissues and spinal canal: No prevertebral fluid or swelling. No visible canal hematoma. Disc levels: Mild loss of disc height at C2-C3. Moderate loss of disc height from C3-C4 through C6-C7. Disc bulging and endplate spurring noted at these levels. Facet degenerative changes, most evident on the right at C3-C4 and on the left at C2-C3. No convincing disc herniation. Upper chest: Subcutaneous air at the right  neck base. Please refer to the patient's current CT chest for further details. Other: None.  IMPRESSION: HEAD CT 1. No acute intracranial abnormalities. CERVICAL CT 1. No fracture. 2. Right neck base soft tissue air. Refer to the current chest CT for further details. Electronically Signed   By: Lajean Manes M.D.   On: 02/06/2022 12:39   CT Cervical Spine Wo Contrast  Result Date: 02/06/2022 CLINICAL DATA:  Patient fell onto the right side.  Chest wall pain. EXAM: CT HEAD WITHOUT CONTRAST CT CERVICAL SPINE WITHOUT CONTRAST TECHNIQUE: Multidetector CT imaging of the head and cervical spine was performed following the standard protocol without intravenous contrast. Multiplanar CT image reconstructions of the cervical spine were also generated. RADIATION DOSE REDUCTION: This exam was performed according to the departmental dose-optimization program which includes automated exposure control, adjustment of the mA and/or kV according to patient size and/or use of iterative reconstruction technique. COMPARISON:  12/22/2016. FINDINGS: CT HEAD FINDINGS Brain: No evidence of acute infarction, hemorrhage, hydrocephalus, extra-axial collection or mass lesion/mass effect. Small old deep white matter lacunar infarcts, right frontal lobe. Mild chronic microvascular ischemic change. Small old infarct suggested in the inferior right cerebellum. These findings are stable. Vascular: No hyperdense vessel or unexpected calcification. Skull: Normal. Negative for fracture or focal lesion. Sinuses/Orbits: Globes and orbits are unremarkable. Sinuses are clear. Other: None. CT CERVICAL SPINE FINDINGS Alignment: Mild kyphosis, apex at C3-C4.  No spondylolisthesis. Skull base and vertebrae: No acute fracture. No primary bone lesion or focal pathologic process. Soft tissues and spinal canal: No prevertebral fluid or swelling. No visible canal hematoma. Disc levels: Mild loss of disc height at C2-C3. Moderate loss of disc height from C3-C4 through C6-C7. Disc bulging and endplate spurring noted at these levels. Facet degenerative  changes, most evident on the right at C3-C4 and on the left at C2-C3. No convincing disc herniation. Upper chest: Subcutaneous air at the right neck base. Please refer to the patient's current CT chest for further details. Other: None. IMPRESSION: HEAD CT 1. No acute intracranial abnormalities. CERVICAL CT 1. No fracture. 2. Right neck base soft tissue air. Refer to the current chest CT for further details. Electronically Signed   By: Lajean Manes M.D.   On: 02/06/2022 12:39    Anti-infectives: Anti-infectives (From admission, onward)    None        Assessment/Plan Fall R rib fxs 5-8 - pulm toilet, pain control, IS R PTX/significant diffuse subq emphysema - s/p chest tube placement.  No air leak noted, but with significant subq emphysema noted into his neck, face, and down both arms as well as other side of his chest.  CXR today shows resolution of PTX.  Repeat film in am.  Increase O2 to 5L to keep sats over 90% A fib on anticoagulation - INR 2.2 this am, per medicine CAD PAC Ischemic cardiomyopathy AAA AV stenosis DVT - on coumadin HTN CKD HLD COPD Hypothyroidism --above per medicine--  FEN - FLD, may ADAT VTE - coumadin ID - none currently needed  I reviewed hospitalist notes, last 24 h vitals and pain scores, last 48 h intake and output, last 24 h labs and trends, and last 24 h imaging results.   LOS: 0 days    Henreitta Cea , [facility] 02/07/2022, 9:53 AM Please see Amion for pager number during day hours 7:00am-4:30pm or 7:00am -11:30am on weekends

## 2022-02-07 NOTE — Care Management Obs Status (Signed)
[facility] NOTIFICATION   Patient Details  Name: [name] MRN: [id number] Date of Birth: June 29, 1940   Medicare Observation Status Notification Given:  Yes    Fuller Mandril, RN 02/07/2022, 11:49 AM

## 2022-02-07 NOTE — Progress Notes (Signed)
PROGRESS NOTE  [name] XBJ:[id number] DOB: Mar 31, 1941 DOA: 02/06/2022 PCP: Owens Loffler, MD   LOS: 0 days   Brief Narrative / Interim history: 81 year old male with CAD status post CABG, ischemic cardiomyopathy, HTN, HLD, hypothyroidism, CKD 3A with baseline creatinine around 1.5, PAF on chronic Coumadin, comes into the hospital after having a fall, was found to have rib fracture as well as pneumothorax and subcutaneous emphysema.  He initially presented to [facility] but was transferred to [facility] for the trauma service.  He had no syncope, chest pain or any other issues, got up from his desk and tripped over a cord and fell onto his right side.  Subjective / 24h Interval events: Patient complains of significant chest pain, worse with deep breathing.  Assesement and Plan: Principal Problem:   Pneumothorax Active Problems:   Rib fractures   Abdominal aortic aneurysm (HCC)   CAD (coronary artery disease), native coronary artery   Ischemic cardiomyopathy   Peripheral arterial disease (HCC)   Aortic valve stenosis   DVT (deep venous thrombosis) (HCC)   HTN (hypertension)   CKD (chronic kidney disease) stage 3, GFR 30-59 ml/min (HCC)   HYPERCHOLESTEROLEMIA   COPD (chronic obstructive pulmonary disease) (HCC)   Hypothyroidism  Principal problem Pneumothorax-following a fall on the right side, he also has rib fractures, also has subcutaneous emphysema.  Trauma surgery consulted, IR placed chest tube.  Appreciate follow-up  Active problems Rib fractures -placed on lidocaine patch, incentive spirometry, oxycodone, Robaxin, morphine   Abdominal aortic aneurysm (HCC) -Appears to be larger based off CT abdomen done on admission at 4.5cm. Recommend f/u imaging q6 months and referral to vascular surgery    CAD (coronary artery disease), native coronary artery -Hx of CABG in 2013. Diagnostic catheterization in November 2022 with 3 of 3 patent grafts and elevated filling pressures.  Continue medical therapy with lipitor, coreg    Ischemic cardiomyopathy -Echo 11/022: EF of 55%.normal LVF. Grade 2 DD, severe aortic stenosis.  Continue to monitor   Peripheral arterial disease (HCC)-Worsening ABIs late last year with lower extremity duplex this year showing significant bilateral SFA disease with collateral flow in bilateral three-vessel runoff.  He has scheduled ABIs in December 2023.  Continue Lipitor, Pletal and Coumadin   Aortic valve stenosis -Echo in November 2022 suggested severe aortic stenosis by valve area but only mild by gradient.  Diagnostic catheterization showed moderate aortic stenosis with a gradient of 15 mmHg and a valve area of 1.1 cm.   DVT (deep venous thrombosis) ([place]) -On coumadin, okay to continue per trauma surgery.  Managed by pharmacy.  HTN -Well controlled, continue norvasc '5mg'$  daily, coreg 6.'25mg'$  BID   CKD (chronic kidney disease) stage 3a- baseline creatinine 1.4-1.5, currently at baseline  HLD-Continue lipitor '20mg'$  daily    COPD (chronic obstructive pulmonary disease) (HCC) -No s/sx of exacerbation    Hypothyroidism -continue home Synthroid, TSH last checked March 2023 and it was 0.42   Scheduled Meds:  amLODipine  5 mg Oral Daily   atorvastatin  20 mg Oral Daily   carvedilol  6.25 mg Oral BID WC   cilostazol  50 mg Oral BID   furosemide  20 mg Oral Daily   levothyroxine  150 mcg Oral QAC breakfast   lidocaine  1 patch Transdermal Q24H   sodium chloride flush  10 mL Intrapleural Q8H   warfarin  5 mg Oral ONCE-1600   Warfarin - Pharmacist Dosing Inpatient   Does not apply q1600   Continuous Infusions:  methocarbamol (ROBAXIN) IV     PRN Meds:.acetaminophen **OR** acetaminophen, furosemide, methocarbamol (ROBAXIN) IV, morphine injection, ondansetron **OR** ondansetron (ZOFRAN) IV, oxyCODONE, triamcinolone ointment  Current Outpatient Medications  Medication Instructions   amLODipine (NORVASC) 5 MG tablet TAKE 1 TABLET BY MOUTH  EVERY DAY   atorvastatin (LIPITOR) 20 MG tablet TAKE 1 TABLET BY MOUTH EVERY DAY   carvedilol (COREG) 6.25 MG tablet TAKE 1 TABLET BY MOUTH TWICE A DAY WITH MEALS   cilostazol (PLETAL) 50 MG tablet TAKE 1 TABLET BY MOUTH TWICE A DAY   furosemide (LASIX) 20 mg, Oral, As directed, Take 20 mg once daily with extra 20 mg as needed for weight gain of 3 pounds overnight or 5 pounds in one week   levothyroxine (SYNTHROID) 150 MCG tablet TAKE ONE TABLET BY MOUTH EVERY DAY BEFORE BREAKFAST   mupirocin ointment (BACTROBAN) 2 % 1 Application, Topical, Daily PRN   triamcinolone ointment (KENALOG) 0.1 % APPLY TOPICALLY TO AFFECTED AREAS TWICE A DAY FOR 14 DAYS THEN MAY APPLY 5 DAYS WEEKLY TO AFFECTED AREAS   warfarin (COUMADIN) 2.5-5 mg, Oral, See admin instructions, TAKE 1/2 TABLET DAILY BY MOUTH EXCEPT TAKE 1 TABLET ON MON, WED, FRI OR AS DIRECTED ANTICOAGULATION CLINIC    Diet Orders (From admission, onward)     Start     Ordered   02/06/22 1912  Diet full liquid Room service appropriate? Yes; Fluid consistency: Thin  Diet effective now       Question Answer Comment  Room service appropriate? Yes   Fluid consistency: Thin      02/06/22 1911            DVT prophylaxis:  warfarin (COUMADIN) tablet 5 mg   Lab Results  Component Value Date   PLT 217 02/07/2022      Code Status: Full Code  Family Communication: No family at bedside  Status is: Observation The patient will require care spanning > 2 midnights and should be moved to inpatient because: Has a chest tube   Level of care: Progressive  Consultants:  Trauma surgery IR  Objective: Vitals:   02/07/22 0630 02/07/22 0645 02/07/22 0700 02/07/22 0715  BP: 116/62 119/63 131/68 (!) 148/72  Pulse: 85 85 84 87  Resp: '13 14 17 '$ (!) 25  Temp:      TempSrc:      SpO2: 94% 95% 95% 93%  Weight:      Height:       No intake or output data in the 24 hours ending 02/07/22 0940 Wt Readings from Last 3 Encounters:  02/06/22 73.9 kg   02/06/22 73.9 kg  01/17/22 76.7 kg    Examination:  Constitutional: NAD Eyes: no scleral icterus ENMT: Mucous membranes are moist.  Neck: normal, supple Respiratory: clear to auscultation bilaterally, no wheezing, no crackles. Normal respiratory effort. No accessory muscle use.  Cardiovascular: Regular rate and rhythm, no murmurs / rubs / gallops. No LE edema.  Abdomen: non distended, no tenderness. Bowel sounds positive.  Musculoskeletal: no clubbing / cyanosis.  Skin: no rashes   Data Reviewed: I have independently reviewed following labs and imaging studies   CBC Recent Labs  Lab 02/06/22 1259 02/07/22 0320  WBC 10.3 7.6  HGB 14.6 13.7  HCT 47.2 44.3  PLT 237 217  MCV 90.6 91.7  MCH 28.0 28.4  MCHC 30.9 30.9  RDW 14.8 14.8  LYMPHSABS 1.5  --   MONOABS 0.6  --   EOSABS 0.2  --   BASOSABS 0.1  --  Recent Labs  Lab 02/06/22 1259 02/07/22 0320  NA 138 140  K 4.6 4.6  CL 105 106  CO2 26 26  GLUCOSE 107* 98  BUN 25* 21  CREATININE 1.51* 1.52*  CALCIUM 8.8* 9.0  AST 18  --   ALT 12  --   ALKPHOS 94  --   BILITOT 1.0  --   ALBUMIN 4.0  --   INR 2.0* 2.2*    ------------------------------------------------------------------------------------------------------------------ No results for input(s): "CHOL", "HDL", "LDLCALC", "TRIG", "CHOLHDL", "LDLDIRECT" in the last 72 hours.  Lab Results  Component Value Date   HGBA1C 5.5 07/05/2021   ------------------------------------------------------------------------------------------------------------------ No results for input(s): "TSH", "T4TOTAL", "T3FREE", "THYROIDAB" in the last 72 hours.  Invalid input(s): "FREET3"  Cardiac Enzymes No results for input(s): "CKMB", "TROPONINI", "MYOGLOBIN" in the last 168 hours.  Invalid input(s): "CK" ------------------------------------------------------------------------------------------------------------------    Component Value Date/Time   BNP 271.8 (H)  11/09/2021 0941    CBG: No results for input(s): "GLUCAP" in the last 168 hours.  No results found for this or any previous visit (from the past 240 hour(s)).   Radiology Studies: DG Chest Port 1 View  Result Date: 02/07/2022 CLINICAL DATA:  81 year old male with history of pneumothorax. Chest tube. EXAM: PORTABLE CHEST 1 VIEW COMPARISON:  Chest x-ray 02/06/2022. FINDINGS: Small bore chest tube with pigtail reformed projecting over the lower right hemithorax. Previously noted right-sided pneumothorax is no longer readily apparent. Lung volumes are low. Bibasilar opacities favored to reflect areas of subsegmental atelectasis. No consolidative airspace disease. No definite pleural effusions. No evidence of pulmonary edema. Heart size is normal. Upper mediastinal contours are within normal limits. Atherosclerotic calcifications are noted in the thoracic aorta. Status post median sternotomy for CABG. Extensive subcutaneous and intramuscular gas noted throughout the thorax bilaterally tracking cephalad into the cervical regions bilaterally. IMPRESSION: 1. Resolution of previously noted right-sided pneumothorax following right-sided chest tube placement. 2. Low lung volumes with bibasilar subsegmental atelectasis. 3. Aortic atherosclerosis. Electronically Signed   By: Vinnie Langton M.D.   On: 02/07/2022 08:15   CT [facility] PLEURAL DRAIN W/INDWELL CATH W/IMG GUIDE  Result Date: 02/06/2022 INDICATION: 81 year old male with history of multifocal mildly displaced rib fractures complicated by pneumothorax and diffuse thoracic and head and neck subcutaneous emphysema. EXAM: CT PERC PLEURAL DRAIN W/INDWELL CATH W/IMG GUIDE COMPARISON:  None Available. MEDICATIONS: The patient is currently admitted to the hospital and receiving intravenous antibiotics. The antibiotics were administered within an appropriate time frame prior to the initiation of the procedure. ANESTHESIA/SEDATION: Moderate (conscious) sedation was  employed during this procedure. A total of Versed 1.5 mg and Fentanyl 50 mcg was administered intravenously. Moderate Sedation Time: 20 minutes. The patient's level of consciousness and vital signs were monitored continuously by radiology nursing throughout the procedure under my direct supervision. CONTRAST:  None COMPLICATIONS: None immediate. PROCEDURE: RADIATION DOSE REDUCTION: This exam was performed according to the departmental dose-optimization program which includes automated exposure control, adjustment of the mA and/or kV according to patient size and/or use of iterative reconstruction technique. Informed written consent was obtained from the patient after a discussion of the risks, benefits and alternatives to treatment. The patient was placed supine, partially left lateral decubitus on the CT gantry and a pre procedural CT was performed re-demonstrating the known right pneumothorax. The procedure was planned. A timeout was performed prior to the initiation of the procedure. The right posterolateral and inferior thorax was prepped and draped in the usual sterile fashion. The overlying soft tissues were anesthetized  with 1% lidocaine with epinephrine. Appropriate trajectory was planned with the use of a 22 gauge spinal needle. An 18 gauge trocar needle was advanced into the pleural space and a short Amplatz super stiff wire was coiled within the pleural space. Appropriate positioning was confirmed with a limited CT scan. The tract was serially dilated allowing placement of a 14 French all-purpose drainage catheter. The tube was connected to a pleurovac and sutured in place. Limited postprocedure chest CT demonstrated complete resolution of the pneumothorax. There was significant persistent head, neck, and thoracic subcutaneous emphysema. A dressing was placed. The patient tolerated the procedure well without immediate post procedural complication. IMPRESSION: Successful CT guided placement of a 69 French  all purpose drain catheter into the anterior right pleural space with complete evacuation of right pneumothorax. Ruthann Cancer, MD [facility] Electronically Signed   By: Ruthann Cancer M.D.   On: 02/06/2022 22:45   DG Chest Portable 1 View  Result Date: 02/06/2022 CLINICAL DATA:  Pneumothorax EXAM: PORTABLE CHEST 1 VIEW COMPARISON:  Chest x-ray 02/06/2022.  CT of the chest 02/06/2022. FINDINGS: Small right pneumothorax is mildly increased from the prior x-ray no measuring 2 cm the lung apex. There is no mediastinal shift. Extensive chest wall emphysema is present in has increased now extending over the left chest and neck. There is no pleural effusion or lung consolidation. There is minimal bibasilar atelectasis, unchanged. Cardiomediastinal silhouette is within normal limits. Patient is status post cardiac surgery. Osseous structures are unchanged. IMPRESSION: 1. Small right pneumothorax is mildly increased from the prior x-ray. 2. Extensive chest wall emphysema has increased now extending over the left chest and neck. Electronically Signed   By: Ronney Asters M.D.   On: 02/06/2022 15:25   CT ABDOMEN PELVIS W CONTRAST  Result Date: 02/06/2022 CLINICAL DATA:  Fall.  Patient fell onto a hardwood floor. EXAM: CT ABDOMEN AND PELVIS WITH CONTRAST TECHNIQUE: Multidetector CT imaging of the abdomen and pelvis was performed using the standard protocol following bolus administration of intravenous contrast. RADIATION DOSE REDUCTION: This exam was performed according to the departmental dose-optimization program which includes automated exposure control, adjustment of the mA and/or kV according to patient size and/or use of iterative reconstruction technique. CONTRAST:  168m OMNIPAQUE IOHEXOL 300 MG/ML  SOLN COMPARISON:  Current chest CT. FINDINGS: Lower chest: Right pneumothorax. Dependent right lung atelectasis. Extensive right chest subcutaneous  emphysema that extends to the right abdomen, as described under the current chest CT. Hepatobiliary: Liver normal in size and attenuation. No laceration contusion. No mass. Status post cholecystectomy. No bile duct dilation. Pancreas: Normal. Spleen: No contusion or laceration.  Normal in size.  No mass. Adrenals/Urinary Tract: No adrenal mass or hemorrhage. Small left kidney with diffuse cortical thinning. Two right renal cysts, largest extending from the anterior midpole, 8.2 cm in size. Tiny low-attenuation lesions along the lower pole right kidney, also consistent with cysts. No follow-up recommended. No stones. No hydronephrosis. Normal ureters. Normal bladder. Stomach/Bowel: No stomach, bowel or mesenteric injury. Stomach is unremarkable. Small bowel and colon are normal in caliber. No wall thickening or inflammation. Scattered left colon diverticula. Portion of the colon, at the junction of the descending and sigmoid colon, enters a left inguinal hernia without evidence obstruction, incarceration or strangulation. Vascular/Lymphatic: Irregular, overall fusiform, infrarenal abdominal aortic aneurysm, 4.5 cm anterior-posterior, 9 cm in length. No aneurysm rupture. No dissection. Well-positioned inferior vena cava filter. No enlarged lymph nodes. Reproductive: Mild prostate enlargement, 4.6 x  4.4 cm. Other: No ascites or hemoperitoneum. Musculoskeletal: Right rib fractures as described under the current chest CT. No other fractures.  No bone lesions. IMPRESSION: 1. No acute findings within the abdomen or pelvis. Specifically, no evidence injury the abdomen or pelvis. 2. Right pneumothorax, dependent right lung atelectasis and large amount right chest wall and right abdominal subcutaneous emphysema, associated with right rib fractures, as described under the current chest CT. 3. 4.5 cm infrarenal abdominal aortic aneurysm. No evidence of rupture. Recommend follow-up CT/MR every 6 months and vascular consultation.  This recommendation follows ACR consensus guidelines: White Paper of the ACR Incidental Findings Committee II on Vascular Findings. J Am Coll Radiol 2013; 10:789-794. 4. Left inguinal hernia containing a small portion of the colon at the junction of the descending and sigmoid, without evidence of obstruction, incarceration or strangulation. Electronically Signed   By: Lajean Manes M.D.   On: 02/06/2022 14:12   DG Chest Portable 1 View  Result Date: 02/06/2022 CLINICAL DATA:  Fall with chest pain. EXAM: PORTABLE CHEST 1 VIEW COMPARISON:  11/09/2021 FINDINGS: A small RIGHT apical pneumothorax is noted (less than 10%. Extensive RIGHT subcutaneous emphysema is present. A fracture of the RIGHT 5th rib is present. The cardiomediastinal silhouette is unremarkable. CABG changes and LEFT subclavian stent again noted. The RIGHT lung is clear. No pleural effusion identified. IMPRESSION: 1. Small RIGHT apical pneumothorax (less than 10%) with extensive RIGHT subcutaneous emphysema. 2. RIGHT 5th rib fracture. Critical Value/emergent results were called by telephone at the time of interpretation on 02/06/2022 at 1:39 pm to provider [facility] , who verbally acknowledged these results. Electronically Signed   By: Margarette Canada M.D.   On: 02/06/2022 13:40   CT CHEST WO CONTRAST  Result Date: 02/06/2022 CLINICAL DATA:  Fall on right side of chest EXAM: CT CHEST WITHOUT CONTRAST TECHNIQUE: Multidetector CT imaging of the chest was performed following the standard protocol without IV contrast. RADIATION DOSE REDUCTION: This exam was performed according to the departmental dose-optimization program which includes automated exposure control, adjustment of the mA and/or kV according to patient size and/or use of iterative reconstruction technique. COMPARISON:  11/09/2021 FINDINGS: Cardiovascular: Aortic atherosclerosis. Dense aortic valve calcifications. Normal heart size. Three-vessel coronary artery calcifications status  post median sternotomy and CABG. No pericardial effusion. Mediastinum/Nodes: No enlarged mediastinal, hilar, or axillary lymph nodes. Thyroid gland, trachea, and esophagus demonstrate no significant findings. Lungs/Pleura: Moderate to severe centrilobular emphysema. Diffuse bilateral bronchial wall thickening. Small volume, approximately 10% right pneumothorax. Upper Abdomen: No acute abnormality. Musculoskeletal: Nondisplaced fractures of the lateral right fifth and posterior right seventh and eighth ribs. Subcutaneous emphysema about the right chest wall. Disc degenerative disease and bridging osteophytosis throughout the thoracic spine, in keeping with DISH. IMPRESSION: 1. Small volume, approximately 10% right pneumothorax. 2. Nondisplaced fractures of the overlying lateral right fifth and posterior right seventh and eighth ribs. 3. Subcutaneous emphysema about the right chest wall. 4. Emphysema and diffuse bilateral bronchial wall thickening. 5. Coronary artery disease. 6. Dense aortic valve calcifications. Correlate for echocardiographic evidence of aortic valve dysfunction when clinically appropriate. These results were called by telephone at the time of interpretation on 02/06/2022 at 12:38 pm to Dr. Jori Moll, who verbally acknowledged these results. Aortic Atherosclerosis (ICD10-I70.0) and Emphysema (ICD10-J43.9). Electronically Signed   By: Delanna Ahmadi M.D.   On: 02/06/2022 12:46   CT Head Wo Contrast  Result Date: 02/06/2022 CLINICAL DATA:  Patient fell onto the right side.  Chest wall pain. EXAM: CT HEAD  WITHOUT CONTRAST CT CERVICAL SPINE WITHOUT CONTRAST TECHNIQUE: Multidetector CT imaging of the head and cervical spine was performed following the standard protocol without intravenous contrast. Multiplanar CT image reconstructions of the cervical spine were also generated. RADIATION DOSE REDUCTION: This exam was performed according to the departmental dose-optimization program which includes  automated exposure control, adjustment of the mA and/or kV according to patient size and/or use of iterative reconstruction technique. COMPARISON:  12/22/2016. FINDINGS: CT HEAD FINDINGS Brain: No evidence of acute infarction, hemorrhage, hydrocephalus, extra-axial collection or mass lesion/mass effect. Small old deep white matter lacunar infarcts, right frontal lobe. Mild chronic microvascular ischemic change. Small old infarct suggested in the inferior right cerebellum. These findings are stable. Vascular: No hyperdense vessel or unexpected calcification. Skull: Normal. Negative for fracture or focal lesion. Sinuses/Orbits: Globes and orbits are unremarkable. Sinuses are clear. Other: None. CT CERVICAL SPINE FINDINGS Alignment: Mild kyphosis, apex at C3-C4.  No spondylolisthesis. Skull base and vertebrae: No acute fracture. No primary bone lesion or focal pathologic process. Soft tissues and spinal canal: No prevertebral fluid or swelling. No visible canal hematoma. Disc levels: Mild loss of disc height at C2-C3. Moderate loss of disc height from C3-C4 through C6-C7. Disc bulging and endplate spurring noted at these levels. Facet degenerative changes, most evident on the right at C3-C4 and on the left at C2-C3. No convincing disc herniation. Upper chest: Subcutaneous air at the right neck base. Please refer to the patient's current CT chest for further details. Other: None. IMPRESSION: HEAD CT 1. No acute intracranial abnormalities. CERVICAL CT 1. No fracture. 2. Right neck base soft tissue air. Refer to the current chest CT for further details. Electronically Signed   By: Lajean Manes M.D.   On: 02/06/2022 12:39   CT Cervical Spine Wo Contrast  Result Date: 02/06/2022 CLINICAL DATA:  Patient fell onto the right side.  Chest wall pain. EXAM: CT HEAD WITHOUT CONTRAST CT CERVICAL SPINE WITHOUT CONTRAST TECHNIQUE: Multidetector CT imaging of the head and cervical spine was performed following the standard  protocol without intravenous contrast. Multiplanar CT image reconstructions of the cervical spine were also generated. RADIATION DOSE REDUCTION: This exam was performed according to the departmental dose-optimization program which includes automated exposure control, adjustment of the mA and/or kV according to patient size and/or use of iterative reconstruction technique. COMPARISON:  12/22/2016. FINDINGS: CT HEAD FINDINGS Brain: No evidence of acute infarction, hemorrhage, hydrocephalus, extra-axial collection or mass lesion/mass effect. Small old deep white matter lacunar infarcts, right frontal lobe. Mild chronic microvascular ischemic change. Small old infarct suggested in the inferior right cerebellum. These findings are stable. Vascular: No hyperdense vessel or unexpected calcification. Skull: Normal. Negative for fracture or focal lesion. Sinuses/Orbits: Globes and orbits are unremarkable. Sinuses are clear. Other: None. CT CERVICAL SPINE FINDINGS Alignment: Mild kyphosis, apex at C3-C4.  No spondylolisthesis. Skull base and vertebrae: No acute fracture. No primary bone lesion or focal pathologic process. Soft tissues and spinal canal: No prevertebral fluid or swelling. No visible canal hematoma. Disc levels: Mild loss of disc height at C2-C3. Moderate loss of disc height from C3-C4 through C6-C7. Disc bulging and endplate spurring noted at these levels. Facet degenerative changes, most evident on the right at C3-C4 and on the left at C2-C3. No convincing disc herniation. Upper chest: Subcutaneous air at the right neck base. Please refer to the patient's current CT chest for further details. Other: None. IMPRESSION: HEAD CT 1. No acute intracranial abnormalities. CERVICAL CT 1. No fracture. 2.  Right neck base soft tissue air. Refer to the current chest CT for further details. Electronically Signed   By: Lajean Manes M.D.   On: 02/06/2022 12:39     Marzetta Board, MD, PhD [facility]  Between 7  am - 7 pm I am available, please contact me via Amion (for emergencies) or Securechat (non urgent messages)  Between 7 pm - 7 am I am not available, please contact night coverage MD/APP via Amion

## 2022-02-07 NOTE — Progress Notes (Addendum)
ANTICOAGULATION CONSULT NOTE - Initial Consult  Pharmacy Consult for warfarin Indication: Hx VTE/AF  Allergies  Allergen Reactions   Plavix [Clopidogrel Bisulfate]     Brain hemorrhage prev while on plavix and aspirin    Patient Measurements: Height: '5\' 9"'$  (175.3 cm) Weight: 73.9 kg (162 lb 15.8 oz) IBW/kg (Calculated) : 70.7   Vital Signs: Temp: 98.2 F (36.8 C) (10/16 0540) Temp Source: Oral (10/15 2338) BP: 131/68 (10/16 0700) Pulse Rate: 84 (10/16 0700)  Labs: Recent Labs    02/06/22 1259 02/07/22 0320  HGB 14.6 13.7  HCT 47.2 44.3  PLT 237 217  LABPROT 22.6* 24.5*  INR 2.0* 2.2*  CREATININE 1.51* 1.52*     Estimated Creatinine Clearance: 38.8 mL/min (A) (by C-G formula based on SCr of 1.52 mg/dL (H)).   Medical History: Past Medical History:  Diagnosis Date   (HFimpEF) heart failure with improved ejection fraction ([place])    a. 06/2011 Echo: EF 35-40%; b. 06/2012 Echo: EF 50%; c. 12/2016 Echo: EF 55-60%, no rwma, GRI DD, mild AS/MR, nl RV fxn, nl RVSP; d. 02/2021 Echo: EF 55%, GrII DD. Mild MR. Mild to mod TR. Sev AS by VTI.   AAA (abdominal aortic aneurysm) ([place])    a. 05/2020 Abd u/s: 3.6cm.   Aortic calcification (HCC) 05/01/2013   Bilateral renal artery stenosis ([place])    a. 06/2013 Angio/PTA: LRA: 95 (5x18 Herculink stent), RRA 60ost; b. 04/2021 Angio: mod RRA stenosis w/ patent LRA stent.   Carotid arterial disease ([place])    a. 05/2020 Carotid U/S: RICA 4-76%, LICA 54-65%   Coronary artery disease    a. 2013 s/p CABG x 3 (LIMA->LAD, VG->OM, VG->RPDA; b. 06/2012 MV: no ischemia; c. 02/2021 Cath: LM 100, RCA 90ost, 161m free LIMA->LAD nl, VG->OM2 nl, VG->RPDA nl. Mod AS (mean grad 162mg, AVA 1.1cm^2). RHC w/ elev filling pressures.   History of prior cigarette smoking 04/11/2008   Qualifier: Diagnosis of  By: BeDiona BrownerD, Amy     Hypertension    Hypothyroidism    Intraventricular hemorrhage ([place]03/20/2014   Ischemic cardiomyopathy    a. 06/2011 Echo: EF  35-40%; b. 06/2012 Echo: EF 50%; c. 12/2016 Echo: EF 55-60%, no rwma, GRI DD; d. 02/2021 Echo: Ef 55%, GrII DD.   Moderate aortic stenosis    a. 02/2021 Echo: EF 55%, GrII DD. Sev Ca2+ of AoV. Sev AS w/ AVA by VTI 0.79cm^2; b. 02/2021 Cath: Mod AS w/ mean grad 1515m. AVA 1.1cm^2.   Peripheral arterial disease ([place]  a. Previous left lower extremity stenting by Dr. SanJamal Collinb. 12/2012 s/p bilat ostial common iliac stenting; c. 04/2021 Angio: Patent LRA stent, mod RRA stenosis. Small AAA. Sev plaque RSFA 2/ subtl occl of inf branch of profunda. Diff Ca2+ SFA dzs w/ collats from profunda and 3V runoff. Diffuse LSFA dzs w/ collats from profunda. Subtl PT dzs. 3V runoff-->Med Rx.   Right leg DVT (HCC[place]urn[place]4/21/2014   Subclavian artery stenosis, left (HCCBix[place]2/2014   Status post stenting of the ostium and self-expanding stent placement to the left axillary artery   Venous insufficiency     Assessment: [age]M presenting s/p fall, hx VTE and pAfib on warfarin PTA.  Pharmacy consulted to restart home warfarin. OK to continue warfarin per surgery/IR  PTA dosing: 2.'5mg'$  daily except '5mg'$  MWF  INR 2.2 this morning. He did not receive his 2.'5mg'$  dose yesterday. Last warfarin dose as documented per med history on 10/14. CBC stable and WNL.  Goal of Therapy:  INR 2-3 Monitor platelets by anticoagulation protocol: Yes   Plan:  Warfarin '5mg'$  PO x 1 today Daily INR, s/s bleeding, surgery plans  Erskine Speed, PharmD Clinical Pharmacist 02/07/2022 7:27 AM

## 2022-02-07 NOTE — Progress Notes (Signed)
Patient and family member stated that the subcutaneous emphysema is getting worse. Upon assessment patients facial and periorbital edema the same. O2 Sats 94-95% on 4L, denies, SOB, chest pain, just soreness at chest tube insertion site. Paged attending and trauma PA. Got orders to place patient back on suction and CXR ordered. Patient laid back in bed, gave iv pain medication. Will reassess.

## 2022-02-08 ENCOUNTER — Inpatient Hospital Stay (HOSPITAL_COMMUNITY): Payer: Medicare Other

## 2022-02-08 ENCOUNTER — Other Ambulatory Visit: Payer: Self-pay

## 2022-02-08 DIAGNOSIS — S270XXA Traumatic pneumothorax, initial encounter: Secondary | ICD-10-CM | POA: Diagnosis not present

## 2022-02-08 LAB — PHOSPHORUS: Phosphorus: 4.4 mg/dL (ref 2.5–4.6)

## 2022-02-08 LAB — COMPREHENSIVE METABOLIC PANEL
ALT: 15 U/L (ref 0–44)
AST: 26 U/L (ref 15–41)
Albumin: 3.3 g/dL — ABNORMAL LOW (ref 3.5–5.0)
Alkaline Phosphatase: 76 U/L (ref 38–126)
Anion gap: 7 (ref 5–15)
BUN: 28 mg/dL — ABNORMAL HIGH (ref 8–23)
CO2: 26 mmol/L (ref 22–32)
Calcium: 8.7 mg/dL — ABNORMAL LOW (ref 8.9–10.3)
Chloride: 106 mmol/L (ref 98–111)
Creatinine, Ser: 1.9 mg/dL — ABNORMAL HIGH (ref 0.61–1.24)
GFR, Estimated: 35 mL/min — ABNORMAL LOW (ref >60)
Glucose, Bld: 103 mg/dL — ABNORMAL HIGH (ref 70–99)
Potassium: 4.7 mmol/L (ref 3.5–5.1)
Sodium: 139 mmol/L (ref 135–145)
Total Bilirubin: 1.3 mg/dL — ABNORMAL HIGH (ref 0.3–1.2)
Total Protein: 6.1 g/dL — ABNORMAL LOW (ref 6.5–8.1)

## 2022-02-08 LAB — CBC
HCT: 44.8 % (ref 39.0–52.0)
Hemoglobin: 14.5 g/dL (ref 13.0–17.0)
MCH: 29.2 pg (ref 26.0–34.0)
MCHC: 32.4 g/dL (ref 30.0–36.0)
MCV: 90.3 fL (ref 80.0–100.0)
Platelets: 206 10*3/uL (ref 150–400)
RBC: 4.96 MIL/uL (ref 4.22–5.81)
RDW: 14.9 % (ref 11.5–15.5)
WBC: 8 10*3/uL (ref 4.0–10.5)
nRBC: 0 % (ref 0.0–0.2)

## 2022-02-08 LAB — PROTIME-INR
INR: 2.7 — ABNORMAL HIGH (ref 0.8–1.2)
Prothrombin Time: 28.7 seconds — ABNORMAL HIGH (ref 11.4–15.2)

## 2022-02-08 LAB — MAGNESIUM: Magnesium: 2.4 mg/dL (ref 1.7–2.4)

## 2022-02-08 MED ORDER — WARFARIN SODIUM 2 MG PO TABS
2.0000 mg | ORAL_TABLET | Freq: Once | ORAL | Status: AC
  Administered 2022-02-08: 2 mg via ORAL
  Filled 2022-02-08: qty 1

## 2022-02-08 MED ORDER — IPRATROPIUM-ALBUTEROL 0.5-2.5 (3) MG/3ML IN SOLN
3.0000 mL | RESPIRATORY_TRACT | Status: DC | PRN
  Administered 2022-02-08: 3 mL via RESPIRATORY_TRACT
  Filled 2022-02-08: qty 3

## 2022-02-08 MED ORDER — DM-GUAIFENESIN ER 30-600 MG PO TB12
1.0000 | ORAL_TABLET | Freq: Two times a day (BID) | ORAL | Status: DC
  Administered 2022-02-08 – 2022-02-12 (×9): 1 via ORAL
  Filled 2022-02-08 (×9): qty 1

## 2022-02-08 MED ORDER — SODIUM CHLORIDE 0.9 % IV BOLUS
500.0000 mL | Freq: Once | INTRAVENOUS | Status: AC
  Administered 2022-02-08: 500 mL via INTRAVENOUS

## 2022-02-08 MED ORDER — DILTIAZEM HCL-DEXTROSE 125-5 MG/125ML-% IV SOLN (PREMIX)
5.0000 mg/h | INTRAVENOUS | Status: DC
  Administered 2022-02-08: 5 mg/h via INTRAVENOUS
  Filled 2022-02-08 (×2): qty 125

## 2022-02-08 MED ORDER — SODIUM CHLORIDE 0.9 % IV SOLN: INTRAVENOUS | Status: AC

## 2022-02-08 MED ORDER — HYDROMORPHONE HCL 1 MG/ML IJ SOLN
0.5000 mg | INTRAMUSCULAR | Status: DC | PRN
  Administered 2022-02-08 (×2): 0.5 mg via INTRAVENOUS
  Filled 2022-02-08 (×2): qty 0.5

## 2022-02-08 NOTE — Progress Notes (Signed)
Mobility Specialist Progress Note   02/08/22 1115  Mobility  Activity Transferred from bed to chair  Level of Assistance Minimal assist, patient does 75% or more  Assistive Device Front wheel walker  Range of Motion/Exercises Active;All extremities  Activity Response Tolerated well;RN notified   Pre Mobility:  HR 86, SpO2 90% 5LO2 During Mobility: HR 93, SpO2 86% 5LO2 Post Mobility: HR 93,  SpO2 89% 5LO2  Patient received in supine, reluctant to participate in mobility. Pt explained he was in excruciating pain and overall just didn't feel well. With encouragement and education on benefits of increasing activity frequency, patient agreed. Required min A + cues and extra time to get EOB from supine to sit. Stood from EOB with minimal HHA + cues for hand placement and took steps to recliner at min guard. Is very anxious of pain with any movement and coughing. Provided education on relaxation breathing and techniques to brace for coughing. After getting up patients pain significantly decreased and appeared to be more comfortable. Tolerated well without incident. Of note, oxygen saturation lingered mid-high 80's upon sitting in recliner chair. Did not display any distress or s/sx. Was left in recliner for lunch with all needs met, call bell in reach.    [place] Marek Nghiem, [place], [place]  Office: [phone]

## 2022-02-08 NOTE — Progress Notes (Signed)
Pt with new onset confusion.  Oriented to self and time, disoriented to place and why he is here.  Pt remains in Afib, HR 120's on cardizem gtt.  Bp 94/60, fluid bolus completed.  Rapid response paged for further assessment.

## 2022-02-08 NOTE — Progress Notes (Signed)
   02/08/22 2244  Assess: MEWS Score  BP (!) 88/56  MAP (mmHg) 68  Pulse Rate (!) 119  ECG Heart Rate (!) 121  Resp 19  SpO2 93 %  Assess: MEWS Score  MEWS Temp 0  MEWS Systolic 1  MEWS Pulse 2  MEWS RR 0  MEWS LOC 0  MEWS Score 3  MEWS Score Color Yellow  Notify: Provider  Provider Name/Title Trauma Nurse  Date Provider Notified 02/08/22  Time Provider Notified 2245  Method of Notification Rounds  Notification Reason Change in status  Provider response At bedside  Date of Provider Response 02/08/22  Time of Provider Response 2245  Document  Progress note created (see row info) Yes  Assess: SIRS CRITERIA  SIRS Temperature  0  SIRS Pulse 1  SIRS Respirations  0  SIRS WBC 1  SIRS Score Sum  2

## 2022-02-08 NOTE — Progress Notes (Signed)
Subjective: Patient doesn't feel well still today, but overall actually looks a bit better.  Denies SOB.  Hasn't mobilized or gotten out of bed.  Eating some with no issues  ROS: See above, otherwise other systems negative  Objective: Vital signs in last 24 hours: Temp:  [97.8 F (36.6 C)-99.4 F (37.4 C)] 98.7 F (37.1 C) (10/17 0809) Pulse Rate:  [79-100] 90 (10/17 0829) Resp:  [14-28] 20 (10/17 0829) BP: (94-158)/(51-74) 125/53 (10/17 0809) SpO2:  [92 %-96 %] 93 % (10/17 0829) FiO2 (%):  [36 %] 36 % (10/17 0829) Last BM Date : 02/05/22  Intake/Output from previous day: 10/16 0701 - 10/17 0700 In: 10  Out: 442 [Urine:400; Drains:42] Intake/Output this shift: No intake/output data recorded.  PE: Gen: NAD HEENT: significant facial edema, periorbital edema, but this has decreased from yesterday, PERRL Heart: regular Lungs: diffuse wheezing.  Chest wall with crepitus, down B arms into hands, up neck, and into face.  No airleak noted on his CT.  On suction Abd: soft, NT, ND Ext: MAE, B upper arms with crepitus down to hands Psych: A&Ox3    Lab Results:  Recent Labs    02/07/22 0320 02/08/22 0024  WBC 7.6 8.0  HGB 13.7 14.5  HCT 44.3 44.8  PLT 217 206   BMET Recent Labs    02/07/22 0320 02/08/22 0024  NA 140 139  K 4.6 4.7  CL 106 106  CO2 26 26  GLUCOSE 98 103*  BUN 21 28*  CREATININE 1.52* 1.90*  CALCIUM 9.0 8.7*   PT/INR Recent Labs    02/07/22 0320 02/08/22 0024  LABPROT 24.5* 28.7*  INR 2.2* 2.7*   CMP     Component Value Date/Time   NA 139 02/08/2022 0024   NA 142 05/06/2021 0953   NA 136 06/03/2013 0454   K 4.7 02/08/2022 0024   K 4.1 06/03/2013 0454   CL 106 02/08/2022 0024   CL 106 06/03/2013 0454   CO2 26 02/08/2022 0024   CO2 27 06/03/2013 0454   GLUCOSE 103 (H) 02/08/2022 0024   GLUCOSE 80 06/03/2013 0454   BUN 28 (H) 02/08/2022 0024   BUN 22 05/06/2021 0953   BUN 22 (H) 06/03/2013 0454   CREATININE 1.90 (H)  02/08/2022 0024   CREATININE 1.59 (H) 06/03/2013 0454   CREATININE 1.02 04/22/2011 1507   CALCIUM 8.7 (L) 02/08/2022 0024   CALCIUM 8.8 06/03/2013 0454   PROT 6.1 (L) 02/08/2022 0024   PROT 6.5 01/31/2014 0756   PROT 6.7 06/03/2013 0454   ALBUMIN 3.3 (L) 02/08/2022 0024   ALBUMIN 4.2 01/31/2014 0756   ALBUMIN 2.1 (L) 06/03/2013 0454   AST 26 02/08/2022 0024   AST 34 06/03/2013 0454   ALT 15 02/08/2022 0024   ALT 52 06/03/2013 0454   ALKPHOS 76 02/08/2022 0024   ALKPHOS 97 06/03/2013 0454   BILITOT 1.3 (H) 02/08/2022 0024   BILITOT 0.6 06/03/2013 0454   GFRNONAA 35 (L) 02/08/2022 0024   GFRNONAA 43 (L) 06/03/2013 0454   GFRAA 59 (L) 12/30/2016 0103   GFRAA 50 (L) 06/03/2013 0454   Lipase     Component Value Date/Time   LIPASE 55 (H) 02/06/2022 1259       Studies/Results: DG CHEST PORT 1 VIEW  Result Date: 02/08/2022 CLINICAL DATA:  Right-sided pneumothorax EXAM: PORTABLE CHEST 1 VIEW COMPARISON:  Chest x-ray dated February 07, 2022 FINDINGS: Unchanged position of right-sided chest tube. Cardiac and mediastinal contours are  unchanged post median sternotomy and CABG. Bibasilar atelectasis. Unchanged mild pneumomediastinum and diffuse soft tissue emphysema. No visible pneumothorax. No large pleural effusion. IMPRESSION: 1. Unchanged position of right-sided chest tube. No visible pneumothorax. 2. Unchanged mild pneumomediastinum and diffuse soft tissue emphysema. Electronically Signed   By: Yetta Glassman M.D.   On: 02/08/2022 08:20   DG CHEST PORT 1 VIEW  Result Date: 02/07/2022 CLINICAL DATA:  Pneumothorax tracks. EXAM: PORTABLE CHEST 1 VIEW COMPARISON:  AP chest 02/07/2022 at 8:07 a.m., AP chest 02/06/2022 (multiple studies) FINDINGS: Single frontal view of the chest 02/07/2022 at 6:08 p.m. Small bore right pigtail catheter again projects over the inferior right lung. Within the limitations of high-grade subcutaneous air throughout the bilateral chest walls and neck, no  definitive right pneumothorax is visualized, however it is difficult to exclude a tiny right apical pneumothorax. These findings appear not significantly changed from 02/07/2022 07 a.m. earlier today. Status post median sternotomy and CABG. Vascular stent overlies the proximal left subclavian artery. Mild high-grade calcification within the aortic arch. Cardiac silhouette is mildly to moderately enlarged. Mildly decreased lung volumes. Likely bibasilar bronchovascular crowding, similar to prior. No acute skeletal abnormality. IMPRESSION: 1. No significant change from 02/07/2022 at 8:07 AM. 2. Small bore right pigtail catheter remains in place. No definitive right pneumothorax is visualized. 3. Similar bibasilar atelectasis. Electronically Signed   By: Yvonne Kendall M.D.   On: 02/07/2022 18:58   DG Chest Port 1 View  Result Date: 02/07/2022 CLINICAL DATA:  81 year old male with history of pneumothorax. Chest tube. EXAM: PORTABLE CHEST 1 VIEW COMPARISON:  Chest x-ray 02/06/2022. FINDINGS: Small bore chest tube with pigtail reformed projecting over the lower right hemithorax. Previously noted right-sided pneumothorax is no longer readily apparent. Lung volumes are low. Bibasilar opacities favored to reflect areas of subsegmental atelectasis. No consolidative airspace disease. No definite pleural effusions. No evidence of pulmonary edema. Heart size is normal. Upper mediastinal contours are within normal limits. Atherosclerotic calcifications are noted in the thoracic aorta. Status post median sternotomy for CABG. Extensive subcutaneous and intramuscular gas noted throughout the thorax bilaterally tracking cephalad into the cervical regions bilaterally. IMPRESSION: 1. Resolution of previously noted right-sided pneumothorax following right-sided chest tube placement. 2. Low lung volumes with bibasilar subsegmental atelectasis. 3. Aortic atherosclerosis. Electronically Signed   By: Vinnie Langton M.D.   On:  02/07/2022 08:15   CT [facility] PLEURAL DRAIN W/INDWELL CATH W/IMG GUIDE  Result Date: 02/06/2022 INDICATION: 81 year old male with history of multifocal mildly displaced rib fractures complicated by pneumothorax and diffuse thoracic and head and neck subcutaneous emphysema. EXAM: CT PERC PLEURAL DRAIN W/INDWELL CATH W/IMG GUIDE COMPARISON:  None Available. MEDICATIONS: The patient is currently admitted to the hospital and receiving intravenous antibiotics. The antibiotics were administered within an appropriate time frame prior to the initiation of the procedure. ANESTHESIA/SEDATION: Moderate (conscious) sedation was employed during this procedure. A total of Versed 1.5 mg and Fentanyl 50 mcg was administered intravenously. Moderate Sedation Time: 20 minutes. The patient's level of consciousness and vital signs were monitored continuously by radiology nursing throughout the procedure under my direct supervision. CONTRAST:  None COMPLICATIONS: None immediate. PROCEDURE: RADIATION DOSE REDUCTION: This exam was performed according to the departmental dose-optimization program which includes automated exposure control, adjustment of the mA and/or kV according to patient size and/or use of iterative reconstruction technique. Informed written consent was obtained from the patient after a discussion of the risks, benefits and alternatives to treatment. The patient was placed supine, partially left  lateral decubitus on the CT gantry and a pre procedural CT was performed re-demonstrating the known right pneumothorax. The procedure was planned. A timeout was performed prior to the initiation of the procedure. The right posterolateral and inferior thorax was prepped and draped in the usual sterile fashion. The overlying soft tissues were anesthetized with 1% lidocaine with epinephrine. Appropriate trajectory was planned with the use of a 22 gauge spinal needle. An 18 gauge trocar needle was advanced into the pleural space and  a short Amplatz super stiff wire was coiled within the pleural space. Appropriate positioning was confirmed with a limited CT scan. The tract was serially dilated allowing placement of a 14 French all-purpose drainage catheter. The tube was connected to a pleurovac and sutured in place. Limited postprocedure chest CT demonstrated complete resolution of the pneumothorax. There was significant persistent head, neck, and thoracic subcutaneous emphysema. A dressing was placed. The patient tolerated the procedure well without immediate post procedural complication. IMPRESSION: Successful CT guided placement of a 46 French all purpose drain catheter into the anterior right pleural space with complete evacuation of right pneumothorax. Ruthann Cancer, MD [facility] Electronically Signed   By: Ruthann Cancer M.D.   On: 02/06/2022 22:45   DG Chest Portable 1 View  Result Date: 02/06/2022 CLINICAL DATA:  Pneumothorax EXAM: PORTABLE CHEST 1 VIEW COMPARISON:  Chest x-ray 02/06/2022.  CT of the chest 02/06/2022. FINDINGS: Small right pneumothorax is mildly increased from the prior x-ray no measuring 2 cm the lung apex. There is no mediastinal shift. Extensive chest wall emphysema is present in has increased now extending over the left chest and neck. There is no pleural effusion or lung consolidation. There is minimal bibasilar atelectasis, unchanged. Cardiomediastinal silhouette is within normal limits. Patient is status post cardiac surgery. Osseous structures are unchanged. IMPRESSION: 1. Small right pneumothorax is mildly increased from the prior x-ray. 2. Extensive chest wall emphysema has increased now extending over the left chest and neck. Electronically Signed   By: Ronney Asters M.D.   On: 02/06/2022 15:25   CT ABDOMEN PELVIS W CONTRAST  Result Date: 02/06/2022 CLINICAL DATA:  Fall.  Patient fell onto a hardwood floor. EXAM: CT ABDOMEN AND PELVIS WITH  CONTRAST TECHNIQUE: Multidetector CT imaging of the abdomen and pelvis was performed using the standard protocol following bolus administration of intravenous contrast. RADIATION DOSE REDUCTION: This exam was performed according to the departmental dose-optimization program which includes automated exposure control, adjustment of the mA and/or kV according to patient size and/or use of iterative reconstruction technique. CONTRAST:  158m OMNIPAQUE IOHEXOL 300 MG/ML  SOLN COMPARISON:  Current chest CT. FINDINGS: Lower chest: Right pneumothorax. Dependent right lung atelectasis. Extensive right chest subcutaneous emphysema that extends to the right abdomen, as described under the current chest CT. Hepatobiliary: Liver normal in size and attenuation. No laceration contusion. No mass. Status post cholecystectomy. No bile duct dilation. Pancreas: Normal. Spleen: No contusion or laceration.  Normal in size.  No mass. Adrenals/Urinary Tract: No adrenal mass or hemorrhage. Small left kidney with diffuse cortical thinning. Two right renal cysts, largest extending from the anterior midpole, 8.2 cm in size. Tiny low-attenuation lesions along the lower pole right kidney, also consistent with cysts. No follow-up recommended. No stones. No hydronephrosis. Normal ureters. Normal bladder. Stomach/Bowel: No stomach, bowel or mesenteric injury. Stomach is unremarkable. Small bowel and colon are normal in caliber. No wall thickening or inflammation. Scattered left colon diverticula. Portion of the colon, at  the junction of the descending and sigmoid colon, enters a left inguinal hernia without evidence obstruction, incarceration or strangulation. Vascular/Lymphatic: Irregular, overall fusiform, infrarenal abdominal aortic aneurysm, 4.5 cm anterior-posterior, 9 cm in length. No aneurysm rupture. No dissection. Well-positioned inferior vena cava filter. No enlarged lymph nodes. Reproductive: Mild prostate enlargement, 4.6 x 4.4 cm.  Other: No ascites or hemoperitoneum. Musculoskeletal: Right rib fractures as described under the current chest CT. No other fractures.  No bone lesions. IMPRESSION: 1. No acute findings within the abdomen or pelvis. Specifically, no evidence injury the abdomen or pelvis. 2. Right pneumothorax, dependent right lung atelectasis and large amount right chest wall and right abdominal subcutaneous emphysema, associated with right rib fractures, as described under the current chest CT. 3. 4.5 cm infrarenal abdominal aortic aneurysm. No evidence of rupture. Recommend follow-up CT/MR every 6 months and vascular consultation. This recommendation follows ACR consensus guidelines: White Paper of the ACR Incidental Findings Committee II on Vascular Findings. J Am Coll Radiol 2013; 10:789-794. 4. Left inguinal hernia containing a small portion of the colon at the junction of the descending and sigmoid, without evidence of obstruction, incarceration or strangulation. Electronically Signed   By: Lajean Manes M.D.   On: 02/06/2022 14:12   DG Chest Portable 1 View  Result Date: 02/06/2022 CLINICAL DATA:  Fall with chest pain. EXAM: PORTABLE CHEST 1 VIEW COMPARISON:  11/09/2021 FINDINGS: A small RIGHT apical pneumothorax is noted (less than 10%. Extensive RIGHT subcutaneous emphysema is present. A fracture of the RIGHT 5th rib is present. The cardiomediastinal silhouette is unremarkable. CABG changes and LEFT subclavian stent again noted. The RIGHT lung is clear. No pleural effusion identified. IMPRESSION: 1. Small RIGHT apical pneumothorax (less than 10%) with extensive RIGHT subcutaneous emphysema. 2. RIGHT 5th rib fracture. Critical Value/emergent results were called by telephone at the time of interpretation on 02/06/2022 at 1:39 pm to provider [facility] , who verbally acknowledged these results. Electronically Signed   By: Margarette Canada M.D.   On: 02/06/2022 13:40   CT CHEST WO CONTRAST  Result Date:  02/06/2022 CLINICAL DATA:  Fall on right side of chest EXAM: CT CHEST WITHOUT CONTRAST TECHNIQUE: Multidetector CT imaging of the chest was performed following the standard protocol without IV contrast. RADIATION DOSE REDUCTION: This exam was performed according to the departmental dose-optimization program which includes automated exposure control, adjustment of the mA and/or kV according to patient size and/or use of iterative reconstruction technique. COMPARISON:  11/09/2021 FINDINGS: Cardiovascular: Aortic atherosclerosis. Dense aortic valve calcifications. Normal heart size. Three-vessel coronary artery calcifications status post median sternotomy and CABG. No pericardial effusion. Mediastinum/Nodes: No enlarged mediastinal, hilar, or axillary lymph nodes. Thyroid gland, trachea, and esophagus demonstrate no significant findings. Lungs/Pleura: Moderate to severe centrilobular emphysema. Diffuse bilateral bronchial wall thickening. Small volume, approximately 10% right pneumothorax. Upper Abdomen: No acute abnormality. Musculoskeletal: Nondisplaced fractures of the lateral right fifth and posterior right seventh and eighth ribs. Subcutaneous emphysema about the right chest wall. Disc degenerative disease and bridging osteophytosis throughout the thoracic spine, in keeping with DISH. IMPRESSION: 1. Small volume, approximately 10% right pneumothorax. 2. Nondisplaced fractures of the overlying lateral right fifth and posterior right seventh and eighth ribs. 3. Subcutaneous emphysema about the right chest wall. 4. Emphysema and diffuse bilateral bronchial wall thickening. 5. Coronary artery disease. 6. Dense aortic valve calcifications. Correlate for echocardiographic evidence of aortic valve dysfunction when clinically appropriate. These results were called by telephone at the time of interpretation on 02/06/2022 at 12:38 pm  to Dr. Jori Moll, who verbally acknowledged these results. Aortic Atherosclerosis  (ICD10-I70.0) and Emphysema (ICD10-J43.9). Electronically Signed   By: Delanna Ahmadi M.D.   On: 02/06/2022 12:46   CT Head Wo Contrast  Result Date: 02/06/2022 CLINICAL DATA:  Patient fell onto the right side.  Chest wall pain. EXAM: CT HEAD WITHOUT CONTRAST CT CERVICAL SPINE WITHOUT CONTRAST TECHNIQUE: Multidetector CT imaging of the head and cervical spine was performed following the standard protocol without intravenous contrast. Multiplanar CT image reconstructions of the cervical spine were also generated. RADIATION DOSE REDUCTION: This exam was performed according to the departmental dose-optimization program which includes automated exposure control, adjustment of the mA and/or kV according to patient size and/or use of iterative reconstruction technique. COMPARISON:  12/22/2016. FINDINGS: CT HEAD FINDINGS Brain: No evidence of acute infarction, hemorrhage, hydrocephalus, extra-axial collection or mass lesion/mass effect. Small old deep white matter lacunar infarcts, right frontal lobe. Mild chronic microvascular ischemic change. Small old infarct suggested in the inferior right cerebellum. These findings are stable. Vascular: No hyperdense vessel or unexpected calcification. Skull: Normal. Negative for fracture or focal lesion. Sinuses/Orbits: Globes and orbits are unremarkable. Sinuses are clear. Other: None. CT CERVICAL SPINE FINDINGS Alignment: Mild kyphosis, apex at C3-C4.  No spondylolisthesis. Skull base and vertebrae: No acute fracture. No primary bone lesion or focal pathologic process. Soft tissues and spinal canal: No prevertebral fluid or swelling. No visible canal hematoma. Disc levels: Mild loss of disc height at C2-C3. Moderate loss of disc height from C3-C4 through C6-C7. Disc bulging and endplate spurring noted at these levels. Facet degenerative changes, most evident on the right at C3-C4 and on the left at C2-C3. No convincing disc herniation. Upper chest: Subcutaneous air at the right  neck base. Please refer to the patient's current CT chest for further details. Other: None. IMPRESSION: HEAD CT 1. No acute intracranial abnormalities. CERVICAL CT 1. No fracture. 2. Right neck base soft tissue air. Refer to the current chest CT for further details. Electronically Signed   By: Lajean Manes M.D.   On: 02/06/2022 12:39   CT Cervical Spine Wo Contrast  Result Date: 02/06/2022 CLINICAL DATA:  Patient fell onto the right side.  Chest wall pain. EXAM: CT HEAD WITHOUT CONTRAST CT CERVICAL SPINE WITHOUT CONTRAST TECHNIQUE: Multidetector CT imaging of the head and cervical spine was performed following the standard protocol without intravenous contrast. Multiplanar CT image reconstructions of the cervical spine were also generated. RADIATION DOSE REDUCTION: This exam was performed according to the departmental dose-optimization program which includes automated exposure control, adjustment of the mA and/or kV according to patient size and/or use of iterative reconstruction technique. COMPARISON:  12/22/2016. FINDINGS: CT HEAD FINDINGS Brain: No evidence of acute infarction, hemorrhage, hydrocephalus, extra-axial collection or mass lesion/mass effect. Small old deep white matter lacunar infarcts, right frontal lobe. Mild chronic microvascular ischemic change. Small old infarct suggested in the inferior right cerebellum. These findings are stable. Vascular: No hyperdense vessel or unexpected calcification. Skull: Normal. Negative for fracture or focal lesion. Sinuses/Orbits: Globes and orbits are unremarkable. Sinuses are clear. Other: None. CT CERVICAL SPINE FINDINGS Alignment: Mild kyphosis, apex at C3-C4.  No spondylolisthesis. Skull base and vertebrae: No acute fracture. No primary bone lesion or focal pathologic process. Soft tissues and spinal canal: No prevertebral fluid or swelling. No visible canal hematoma. Disc levels: Mild loss of disc height at C2-C3. Moderate loss of disc height from C3-C4  through C6-C7. Disc bulging and endplate spurring noted at these levels. Facet  degenerative changes, most evident on the right at C3-C4 and on the left at C2-C3. No convincing disc herniation. Upper chest: Subcutaneous air at the right neck base. Please refer to the patient's current CT chest for further details. Other: None. IMPRESSION: HEAD CT 1. No acute intracranial abnormalities. CERVICAL CT 1. No fracture. 2. Right neck base soft tissue air. Refer to the current chest CT for further details. Electronically Signed   By: Lajean Manes M.D.   On: 02/06/2022 12:39    Anti-infectives: Anti-infectives (From admission, onward)    None        Assessment/Plan Fall R rib fxs 5-8 - pulm toilet, pain control, IS R PTX/significant diffuse subq emphysema - s/p chest tube placement.  No air leak noted, but with significant subq emphysema noted into his neck, face, and down both arms as well as other side of his chest.  CXR today shows resolution of PTX.  Repeat film in am.  Leave CT to suction today.  D/W IR.  May consider waterseal tomorrow.  On 4L [place] today with sats around 93%.  PT/OT eval A fib on anticoagulation - INR 2.7 this am, per medicine/pharmacy CAD PAC Ischemic cardiomyopathy AAA AV stenosis DVT - on coumadin HTN CKD HLD COPD Hypothyroidism --above per medicine--  FEN - regular VTE - coumadin ID - none currently needed  I reviewed hospitalist notes, last 24 h vitals and pain scores, last 48 h intake and output, last 24 h labs and trends, and last 24 h imaging results.   LOS: 1 day    [name] , [facility] 02/08/2022, 10:18 AM Please see Amion for pager number during day hours 7:00am-4:30pm or 7:00am -11:30am on weekends

## 2022-02-08 NOTE — Evaluation (Signed)
Occupational Therapy Evaluation Patient Details Name: [name] MRN: [id number] DOB: [birth year] Today's Date: 02/08/2022   History of Present Illness 81 year old male who presented after a fall. Found to have R rib fxs 5-8 and pneumothorax with chest tube placement. PMHx: CAD status post CABG, ischemic cardiomyopathy, HTN, HLD, hypothyroidism, CKD 3A with baseline creatinine around 1.5, PAF on chronic Coumadin   Clinical Impression   [name] was evaluated s/p the above admission list, evaluated limited by decreased LOA and confusion, likely due to IV pain medication given prior to the session. He has functional limitations due to R flank pain, impaired cognition and decreased activity tolerance. Overall he required min A fot sit<>stand but declined further mobility. Due to deficits listed below he also requires up to max A for LB ADLs and min A for UB ADLs. OT to continue to follow acutely. Recommend d/c to home with [place] in anticipation pt has good progress acutely.      Recommendations for follow up therapy are one component of a multi-disciplinary discharge planning process, led by the attending physician.  Recommendations may be updated based on patient status, additional functional criteria and insurance authorization.   Follow Up Recommendations  Home health OT    Assistance Recommended at Discharge Intermittent Supervision/Assistance  Patient can return home with the following A little help with walking and/or transfers;A lot of help with bathing/dressing/bathroom;Assistance with cooking/housework;Direct supervision/assist for financial management;Direct supervision/assist for medications management;Assist for transportation;Help with stairs or ramp for entrance    Functional Status Assessment  Patient has had a recent decline in their functional status and demonstrates the ability to make significant improvements in function in a reasonable and predictable amount of time.  Equipment  Recommendations  None recommended by OT    Recommendations for Other Services       Precautions / Restrictions Precautions Precautions: Fall Precaution Comments: chest tube Restrictions Weight Bearing Restrictions: No      Mobility Bed Mobility               General bed mobility comments: OOB in chair upon arrival    Transfers Overall transfer level: Needs assistance Equipment used: 1 person hand held assist Transfers: Sit to/from Stand Sit to Stand: Min assist           General transfer comment: pt declined further mobility      Balance Overall balance assessment: Needs assistance Sitting-balance support: Feet supported Sitting balance-Leahy Scale: Fair     Standing balance support: Single extremity supported Standing balance-Leahy Scale: Poor                             ADL either performed or assessed with clinical judgement   ADL Overall ADL's : Needs assistance/impaired Eating/Feeding: Independent;Sitting   Grooming: Set up;Sitting   Upper Body Bathing: Minimal assistance;Sitting   Lower Body Bathing: Maximal assistance;Sit to/from stand   Upper Body Dressing : Minimal assistance;Sitting   Lower Body Dressing: Maximal assistance;Sit to/from stand   Toilet Transfer: Minimal assistance;Stand-pivot;BSC/3in1   Toileting- Clothing Manipulation and Hygiene: Maximal assistance;Sit to/from stand       Functional mobility during ADLs: Minimal assistance General ADL Comments: limited by confusion and decreased LOA. Pain exacerbated with any RUE movement     Vision Baseline Vision/History: 1 Wears glasses Ability to See in Adequate Light: 0 Adequate Vision Assessment?: No apparent visual deficits     Perception     Praxis  Pertinent Vitals/Pain Pain Assessment Pain Assessment: Faces Faces Pain Scale: Hurts a little bit Pain Location: R flank with RUE movement Pain Descriptors / Indicators: Discomfort Pain  Intervention(s): Limited activity within patient's tolerance, Monitored during session     Hand Dominance Left   Extremity/Trunk Assessment Upper Extremity Assessment Upper Extremity Assessment: RUE deficits/detail RUE Deficits / Details: limited AAROM due to flank pain RUE: Unable to fully assess due to pain RUE Sensation: WNL RUE Coordination: decreased gross motor   Lower Extremity Assessment Lower Extremity Assessment: Defer to PT evaluation   Cervical / Trunk Assessment Cervical / Trunk Assessment: Other exceptions Cervical / Trunk Exceptions: R rib fxs and chest tube   Communication Communication Communication: No difficulties   Cognition Arousal/Alertness: Lethargic, Suspect due to medications Behavior During Therapy: Flat affect Overall Cognitive Status: Impaired/Different from baseline Area of Impairment: Attention, Memory, Following commands, Safety/judgement, Awareness, Problem solving                   Current Attention Level: Focused Memory: Decreased recall of precautions, Decreased short-term memory Following Commands: Follows one step commands inconsistently Safety/Judgement: Decreased awareness of deficits, Decreased awareness of safety Awareness: Intellectual Problem Solving: Slow processing, Decreased initiation, Difficulty sequencing, Requires verbal cues General Comments: lethargic upon arrival, pt with low arousal and required constant cues to stay awake. Confusion noted. Likely due to IV dilaudid given prior to session     General Comments  VSS on 4L    Exercises     Shoulder Instructions      Home Living Family/patient expects to be discharged to:: Private residence Living Arrangements: Spouse/significant other Available Help at Discharge: Family;Available 24 hours/day Type of Home: House Home Access: Stairs to enter [employer] of Steps: 10? Entrance Stairs-Rails: Right;Left Home Layout: One level     Bathroom  Shower/Tub: Occupational psychologist: Standard     Home Equipment: Conservation officer, nature (2 wheels);BSC/3in1;Cane - single point   Additional Comments: pt states 10STE, however chart from previous admission states 3 STE      Prior Functioning/Environment Prior Level of Function : Independent/Modified Independent;Driving                        OT Problem List: Decreased strength;Decreased range of motion;Decreased activity tolerance;Impaired balance (sitting and/or standing);Decreased cognition;Decreased knowledge of use of DME or AE;Decreased safety awareness;Cardiopulmonary status limiting activity;Pain      OT Treatment/Interventions: Self-care/ADL training;Therapeutic exercise;DME and/or AE instruction;Therapeutic activities;Balance training;Patient/family education    OT Goals(Current goals can be found in the care plan section) Acute Rehab OT Goals Patient Stated Goal: home OT Goal Formulation: With patient Time For Goal Achievement: 02/22/22 Potential to Achieve Goals: Good ADL Goals Pt Will Perform Grooming: with modified independence;standing Pt Will Perform Upper Body Dressing: with modified independence;sitting Pt Will Perform Lower Body Dressing: with supervision;sit to/from stand Pt Will Transfer to Toilet: with supervision;ambulating  OT Frequency: Min 2X/week    Co-evaluation              AM-PAC OT "6 Clicks" Daily Activity     Outcome Measure Help from another person eating meals?: None Help from another person taking care of personal grooming?: A Little Help from another person toileting, which includes using toliet, bedpan, or urinal?: A Lot Help from another person bathing (including washing, rinsing, drying)?: A Lot Help from another person to put on and taking off regular upper body clothing?: A Little Help from another person to put  on and taking off regular lower body clothing?: A Lot 6 Click Score: 16   End of Session Equipment  Utilized During Treatment: Oxygen Nurse Communication: Mobility status  Activity Tolerance: Patient limited by pain Patient left: in chair;with call bell/phone within reach  OT Visit Diagnosis: Unsteadiness on feet (R26.81);Other abnormalities of gait and mobility (R26.89);Muscle weakness (generalized) (M62.81);History of falling (Z91.81);Pain                Time: 8979-1504 OT Time Calculation (min): 16 min Charges:  OT General Charges $OT Visit: 1 Visit OT Evaluation $OT Eval Moderate Complexity: 1 Mod    Mayumi Summerson D Causey 02/08/2022, 2:37 PM

## 2022-02-08 NOTE — Progress Notes (Signed)
   02/08/22 2003  Assess: MEWS Score  Temp 99.1 F (37.3 C)  BP 94/79  MAP (mmHg) 86  Pulse Rate (!) 118  ECG Heart Rate (!) 118  Resp 17  SpO2 92 %  Assess: MEWS Score  MEWS Temp 0  MEWS Systolic 1  MEWS Pulse 2  MEWS RR 0  MEWS LOC 0  MEWS Score 3  MEWS Score Color Yellow  Assess: if the MEWS score is Yellow or Red  Were vital signs taken at a resting state? Yes  Focused Assessment Change from prior assessment (see assessment flowsheet) (Afib RVR)  Does the patient meet 2 or more of the SIRS criteria? Yes  Does the patient have a confirmed or suspected source of infection? No  MEWS guidelines implemented *See Row Information* Yes  Treat  MEWS Interventions Administered scheduled meds/treatments;Escalated (See documentation below);Administered prn meds/treatments  Pain Scale 0-10  Pain Score 8  Pain Type Acute pain  Pain Location Chest  Pain Orientation Right  Pain Descriptors / Indicators Aching  Pain Onset On-going  Patients Stated Pain Goal 0  Pain Intervention(s) Medication (See eMAR);Repositioned  Take Vital Signs  Increase Vital Sign Frequency  Yellow: Q 2hr X 2 then Q 4hr X 2, if remains yellow, continue Q 4hrs  Escalate  MEWS: Escalate Yellow: discuss with charge nurse/RN and consider discussing with provider and RRT  Notify: Charge Nurse/RN  Name of Charge Nurse/RN Notified Erlene Quan  Date Charge Nurse/RN Notified 02/08/22  Time Charge Nurse/RN Notified 2108  Notify: Provider  Provider Name/Title Dr. Bridgett Larsson  Date Provider Notified 02/08/22  Time Provider Notified 2000  Method of Notification Call  Notification Reason Change in status  Provider response See new orders  Date of Provider Response 02/08/22  Time of Provider Response 2000  Document  Patient Outcome Other (Comment) (ongoing assessment)  Progress note created (see row info) Yes  Assess: SIRS CRITERIA  SIRS Temperature  0  SIRS Pulse 1  SIRS Respirations  0  SIRS WBC 1  SIRS Score Sum   2

## 2022-02-08 NOTE — Progress Notes (Signed)
[place] for warfarin Indication: Hx VTE/AF  Allergies  Allergen Reactions   Plavix [Clopidogrel Bisulfate]     Brain hemorrhage prev while on plavix and aspirin    Patient Measurements: Height: '5\' 9"'$  (175.3 cm) Weight: 73.9 kg (162 lb 15.8 oz) IBW/kg (Calculated) : 70.7   Vital Signs: Temp: 98.7 F (37.1 C) (10/17 0809) Temp Source: Oral (10/17 0809) BP: 125/53 (10/17 0809) Pulse Rate: 90 (10/17 0829)  Labs: Recent Labs    02/06/22 1259 02/07/22 0320 02/08/22 0024  HGB 14.6 13.7 14.5  HCT 47.2 44.3 44.8  PLT 237 217 206  LABPROT 22.6* 24.5* 28.7*  INR 2.0* 2.2* 2.7*  CREATININE 1.51* 1.52* 1.90*     Estimated Creatinine Clearance: 31 mL/min (A) (by C-G formula based on SCr of 1.9 mg/dL (H)).   Medical History: Past Medical History:  Diagnosis Date   (HFimpEF) heart failure with improved ejection fraction ([place])    a. 06/2011 Echo: EF 35-40%; b. 06/2012 Echo: EF 50%; c. 12/2016 Echo: EF 55-60%, no rwma, GRI DD, mild AS/MR, nl RV fxn, nl RVSP; d. 02/2021 Echo: EF 55%, GrII DD. Mild MR. Mild to mod TR. Sev AS by VTI.   AAA (abdominal aortic aneurysm) ([place])    a. 05/2020 Abd u/s: 3.6cm.   Aortic calcification (H[facility]) 05/01/2013   Bilateral renal artery stenosis ([place])    a. 06/2013 Angio/PTA: LRA: 95 (5x18 Herculink stent), RRA 60ost; b. 04/2021 Angio: mod RRA stenosis w/ patent LRA stent.   Carotid arterial disease ([place])    a. 05/2020 Carotid U/S: RICA 8-46%, LICA 96-29%   Coronary artery disease    a. 2013 s/p CABG x 3 (LIMA->LAD, VG->OM, VG->RPDA; b. 06/2012 MV: no ischemia; c. 02/2021 Cath: LM 100, RCA 90ost, 127m free LIMA->LAD nl, VG->OM2 nl, VG->RPDA nl. Mod AS (mean grad 143mg, AVA 1.1cm^2). RHC w/ elev filling pressures.   History of prior cigarette smoking 04/11/2008   Qualifier: Diagnosis of  By: BeDiona BrownerD, Amy     Hypertension    Hypothyroidism    Intraventricular hemorrhage ([facility]03/20/2014   Ischemic cardiomyopathy     a. 06/2011 Echo: EF 35-40%; b. 06/2012 Echo: EF 50%; c. 12/2016 Echo: EF 55-60%, no rwma, GRI DD; d. 02/2021 Echo: Ef 55%, GrII DD.   Moderate aortic stenosis    a. 02/2021 Echo: EF 55%, GrII DD. Sev Ca2+ of AoV. Sev AS w/ AVA by VTI 0.79cm^2; b. 02/2021 Cath: Mod AS w/ mean grad 1535m. AVA 1.1cm^2.   Peripheral arterial disease ([facility]  a. Previous left lower extremity stenting by Dr. SanJamal Collinb. 12/2012 s/p bilat ostial common iliac stenting; c. 04/2021 Angio: Patent LRA stent, mod RRA stenosis. Small AAA. Sev plaque RSFA 2/ subtl occl of inf branch of profunda. Diff Ca2+ SFA dzs w/ collats from profunda and 3V runoff. Diffuse LSFA dzs w/ collats from profunda. Subtl PT dzs. 3V runoff-->Med Rx.   Right leg DVT ([facility]/21/2014   Subclavian artery stenosis, left ([facility]2/2014   Status post stenting of the ostium and self-expanding stent placement to the left axillary artery   Venous insufficiency     Assessment: [age]M presenting s/p fall, hx VTE and pAfib on warfarin PTA.  Pharmacy consulted to restart home warfarin. OK to continue warfarin per surgery/IR  PTA dosing: 2.'5mg'$  daily except '5mg'$  MWF  INR trending up to 2.7 this morning. Last warfarin dose as documented per med history on 10/14. CBC stable overnight.  Goal of Therapy:  INR 2-3 Monitor platelets by anticoagulation protocol: Yes   Plan:  Given trend up will give lower dose today - '2mg'$  Daily INR, s/s bleeding  Erin Hearing PharmD., BCPS Clinical Pharmacist 02/08/2022 9:55 AM

## 2022-02-08 NOTE — Progress Notes (Addendum)
PROGRESS NOTE  [name] DGU:[id number] DOB: April 03, 1941 DOA: 02/06/2022 PCP: Owens Loffler, MD   LOS: 1 day   Brief Narrative / Interim history: 81 year old male with CAD status post CABG, ischemic cardiomyopathy, HTN, HLD, hypothyroidism, CKD 3A with baseline creatinine around 1.5, PAF on chronic Coumadin, comes into the hospital after having a fall, was found to have rib fracture as well as pneumothorax and subcutaneous emphysema.  He initially presented to [facility] but was transferred to [facility] for the trauma service.  He had no syncope, chest pain or any other issues, got up from his desk and tripped over a cord and fell onto his right side.  Subjective / 24h Interval events: He tells me he is feeling "miserable".  Complains of pain, intermittent shortness of breath and just overall being very uncomfortable  Assesement and Plan: Principal Problem:   Pneumothorax Active Problems:   Rib fractures   Abdominal aortic aneurysm (HCC)   CAD (coronary artery disease), native coronary artery   Ischemic cardiomyopathy   Peripheral arterial disease (HCC)   Aortic valve stenosis   DVT (deep venous thrombosis) (HCC)   HTN (hypertension)   CKD (chronic kidney disease) stage 3, GFR 30-59 ml/min (HCC)   HYPERCHOLESTEROLEMIA   COPD (chronic obstructive pulmonary disease) (HCC)   Hypothyroidism  Principal problem Pneumothorax with subcutaneous emphysema acute hypoxic respiratory failure-following a fall on the right side, he also has rib fractures, also has subcutaneous emphysema.  Trauma surgery consulted, IR placed chest tube.  Appreciate follow-up -Chest x-ray stable this morning, chest tube remains to suction.  Repeat chest x-ray tomorrow morning. -Stable on 4 L nasal cannula  Active problems Rib fractures -placed on lidocaine patch, incentive spirometry, oxycodone, Robaxin, continue pain control   Abdominal aortic aneurysm (HCC) -Appears to be larger based off CT abdomen done on  admission at 4.5cm. Recommend f/u imaging q6 months and referral to vascular surgery    CAD (coronary artery disease), native coronary artery -Hx of CABG in 2013. Diagnostic catheterization in November 2022 with 3 of 3 patent grafts and elevated filling pressures. Continue medical therapy with lipitor, coreg.  No ACS type chest pain   Ischemic cardiomyopathy, chronic diastolic CHF-Echo 95/638: EF of 55%.normal LVF. Grade 2 DD, severe aortic stenosis.  Continue to monitor   Peripheral arterial disease (HCC)-Worsening ABIs late last year with lower extremity duplex this year showing significant bilateral SFA disease with collateral flow in bilateral three-vessel runoff.  He has scheduled ABIs in December 2023.  Continue Lipitor, Pletal and Coumadin   Aortic valve stenosis -Echo in November 2022 suggested severe aortic stenosis by valve area but only mild by gradient.  Diagnostic catheterization showed moderate aortic stenosis with a gradient of 15 mmHg and a valve area of 1.1 cm.   DVT (deep venous thrombosis) ([place]) -On coumadin, okay to continue per trauma surgery.  Managed by pharmacy.  HTN -Well controlled, continue norvasc '5mg'$  daily, coreg 6.'25mg'$  BID   AKI on CKD (chronic kidney disease) stage 3a- baseline creatinine 1.4-1.5, currently his creatinine is slightly higher at 1.9.  Suspect dehydration.  Start IV fluids (limited, due to diastolic dysfunction), bladder scan to ensure he is not retaining  HLD-Continue lipitor '20mg'$  daily    COPD (chronic obstructive pulmonary disease) (HCC) -No s/sx of exacerbation    Hypothyroidism -continue home Synthroid, TSH last checked March 2023 and it was 0.42   Scheduled Meds:  amLODipine  5 mg Oral Daily   atorvastatin  20 mg Oral Daily  carvedilol  6.25 mg Oral BID WC   cilostazol  50 mg Oral BID   levothyroxine  150 mcg Oral QAC breakfast   lidocaine  1 patch Transdermal Q24H   sodium chloride flush  10 mL Intrapleural Q8H   warfarin  2 mg Oral  ONCE-1600   Warfarin - Pharmacist Dosing Inpatient   Does not apply q1600   Continuous Infusions:  sodium chloride     methocarbamol (ROBAXIN) IV     PRN Meds:.acetaminophen **OR** acetaminophen, furosemide, HYDROmorphone (DILAUDID) injection, ipratropium-albuterol, methocarbamol (ROBAXIN) IV, ondansetron **OR** ondansetron (ZOFRAN) IV, oxyCODONE, triamcinolone ointment  Current Outpatient Medications  Medication Instructions   amLODipine (NORVASC) 5 MG tablet TAKE 1 TABLET BY MOUTH EVERY DAY   atorvastatin (LIPITOR) 20 MG tablet TAKE 1 TABLET BY MOUTH EVERY DAY   carvedilol (COREG) 6.25 MG tablet TAKE 1 TABLET BY MOUTH TWICE A DAY WITH MEALS   cilostazol (PLETAL) 50 MG tablet TAKE 1 TABLET BY MOUTH TWICE A DAY   furosemide (LASIX) 20 mg, Oral, As directed, Take 20 mg once daily with extra 20 mg as needed for weight gain of 3 pounds overnight or 5 pounds in one week   levothyroxine (SYNTHROID) 150 MCG tablet TAKE ONE TABLET BY MOUTH EVERY DAY BEFORE BREAKFAST   mupirocin ointment (BACTROBAN) 2 % 1 Application, Topical, Daily PRN   triamcinolone ointment (KENALOG) 0.1 % APPLY TOPICALLY TO AFFECTED AREAS TWICE A DAY FOR 14 DAYS THEN MAY APPLY 5 DAYS WEEKLY TO AFFECTED AREAS   warfarin (COUMADIN) 2.5-5 mg, Oral, See admin instructions, TAKE 1/2 TABLET DAILY BY MOUTH EXCEPT TAKE 1 TABLET ON MON, WED, FRI OR AS DIRECTED ANTICOAGULATION CLINIC    Diet Orders (From admission, onward)     Start     Ordered   02/07/22 1538  Diet regular Room service appropriate? Yes; Fluid consistency: Thin  Diet effective now       Question Answer Comment  Room service appropriate? Yes   Fluid consistency: Thin      02/07/22 1537            DVT prophylaxis:  warfarin (COUMADIN) tablet 2 mg   Lab Results  Component Value Date   PLT 206 02/08/2022      Code Status: Full Code  Family Communication: No family at bedside  Status is: Inpatient   Level of care: Progressive  Consultants:   Trauma surgery IR  Objective: Vitals:   02/07/22 2316 02/08/22 0305 02/08/22 0809 02/08/22 0829  BP: (!) 129/51 (!) 143/56 (!) 125/53   Pulse: 93 100 90 90  Resp: 18 (!) 25 (!) 21 20  Temp: 99.4 F (37.4 C) 98.2 F (36.8 C) 98.7 F (37.1 C)   TempSrc: Oral Oral Oral   SpO2: 93% 93% 93% 93%  Weight:      Height:        Intake/Output Summary (Last 24 hours) at 02/08/2022 1108 Last data filed at 02/08/2022 2536 Gross per 24 hour  Intake --  Output 412 ml  Net -412 ml   Wt Readings from Last 3 Encounters:  02/06/22 73.9 kg  02/06/22 73.9 kg  01/17/22 76.7 kg    Examination:  Constitutional: NAD Eyes: lids and conjunctivae normal, no scleral icterus ENMT: mmm Neck: normal, supple Respiratory: Diminished at the bases but overall clear, no wheezing heard Cardiovascular: Regular rate and rhythm, no murmurs / rubs / gallops. No LE edema. Abdomen: soft, no distention, no tenderness. Bowel sounds positive.  Skin: no rashes, crepitus upon  pressing the skin on the chest and shoulder area Neurologic: no focal deficits, equal strength   Data Reviewed: I have independently reviewed following labs and imaging studies   CBC Recent Labs  Lab 02/06/22 1259 02/07/22 0320 02/08/22 0024  WBC 10.3 7.6 8.0  HGB 14.6 13.7 14.5  HCT 47.2 44.3 44.8  PLT 237 217 206  MCV 90.6 91.7 90.3  MCH 28.0 28.4 29.2  MCHC 30.9 30.9 32.4  RDW 14.8 14.8 14.9  LYMPHSABS 1.5  --   --   MONOABS 0.6  --   --   EOSABS 0.2  --   --   BASOSABS 0.1  --   --      Recent Labs  Lab 02/06/22 1259 02/07/22 0320 02/08/22 0024  NA 138 140 139  K 4.6 4.6 4.7  CL 105 106 106  CO2 '26 26 26  '$ GLUCOSE 107* 98 103*  BUN 25* 21 28*  CREATININE 1.51* 1.52* 1.90*  CALCIUM 8.8* 9.0 8.7*  AST 18  --  26  ALT 12  --  15  ALKPHOS 94  --  76  BILITOT 1.0  --  1.3*  ALBUMIN 4.0  --  3.3*  MG  --   --  2.4  INR 2.0* 2.2* 2.7*      ------------------------------------------------------------------------------------------------------------------ No results for input(s): "CHOL", "HDL", "LDLCALC", "TRIG", "CHOLHDL", "LDLDIRECT" in the last 72 hours.  Lab Results  Component Value Date   HGBA1C 5.5 07/05/2021   ------------------------------------------------------------------------------------------------------------------ No results for input(s): "TSH", "T4TOTAL", "T3FREE", "THYROIDAB" in the last 72 hours.  Invalid input(s): "FREET3"  Cardiac Enzymes No results for input(s): "CKMB", "TROPONINI", "MYOGLOBIN" in the last 168 hours.  Invalid input(s): "CK" ------------------------------------------------------------------------------------------------------------------    Component Value Date/Time   BNP 271.8 (H) 11/09/2021 0941    CBG: No results for input(s): "GLUCAP" in the last 168 hours.  No results found for this or any previous visit (from the past 240 hour(s)).   Radiology Studies: DG CHEST PORT 1 VIEW  Result Date: 02/08/2022 CLINICAL DATA:  Right-sided pneumothorax EXAM: PORTABLE CHEST 1 VIEW COMPARISON:  Chest x-ray dated February 07, 2022 FINDINGS: Unchanged position of right-sided chest tube. Cardiac and mediastinal contours are unchanged post median sternotomy and CABG. Bibasilar atelectasis. Unchanged mild pneumomediastinum and diffuse soft tissue emphysema. No visible pneumothorax. No large pleural effusion. IMPRESSION: 1. Unchanged position of right-sided chest tube. No visible pneumothorax. 2. Unchanged mild pneumomediastinum and diffuse soft tissue emphysema. Electronically Signed   By: Yetta Glassman M.D.   On: 02/08/2022 08:20   DG CHEST PORT 1 VIEW  Result Date: 02/07/2022 CLINICAL DATA:  Pneumothorax tracks. EXAM: PORTABLE CHEST 1 VIEW COMPARISON:  AP chest 02/07/2022 at 8:07 a.m., AP chest 02/06/2022 (multiple studies) FINDINGS: Single frontal view of the chest 02/07/2022 at 6:08  p.m. Small bore right pigtail catheter again projects over the inferior right lung. Within the limitations of high-grade subcutaneous air throughout the bilateral chest walls and neck, no definitive right pneumothorax is visualized, however it is difficult to exclude a tiny right apical pneumothorax. These findings appear not significantly changed from 02/07/2022 07 a.m. earlier today. Status post median sternotomy and CABG. Vascular stent overlies the proximal left subclavian artery. Mild high-grade calcification within the aortic arch. Cardiac silhouette is mildly to moderately enlarged. Mildly decreased lung volumes. Likely bibasilar bronchovascular crowding, similar to prior. No acute skeletal abnormality. IMPRESSION: 1. No significant change from 02/07/2022 at 8:07 AM. 2. Small bore right pigtail catheter remains in place. No definitive  right pneumothorax is visualized. 3. Similar bibasilar atelectasis. Electronically Signed   By: Yvonne Kendall M.D.   On: 02/07/2022 18:58     Marzetta Board, MD, PhD [facility]  Between 7 am - 7 pm I am available, please contact me via Amion (for emergencies) or Securechat (non urgent messages)  Between 7 pm - 7 am I am not available, please contact night coverage MD/APP via Amion

## 2022-02-08 NOTE — Progress Notes (Signed)
   02/08/22 2200  Assess: MEWS Score  Temp 99 F (37.2 C)  BP 94/60  MAP (mmHg) 72  Pulse Rate (!) 115  ECG Heart Rate (!) 116  Resp 13  Level of Consciousness Alert  SpO2 94 %  O2 Device HFNC  O2 Flow Rate (L/min) 4 L/min  Assess: MEWS Score  MEWS Temp 0  MEWS Systolic 1  MEWS Pulse 2  MEWS RR 1  MEWS LOC 0  MEWS Score 4  MEWS Score Color Red  Assess: if the MEWS score is Yellow or Red  Were vital signs taken at a resting state? Yes  Focused Assessment Change from prior assessment (see assessment flowsheet)  Does the patient meet 2 or more of the SIRS criteria? Yes  Does the patient have a confirmed or suspected source of infection? No  MEWS guidelines implemented *See Row Information* Yes  Treat  MEWS Interventions Escalated (See documentation below)  Pain Scale 0-10  Pain Score 0  Take Vital Signs  Increase Vital Sign Frequency  Red: Q 1hr X 4 then Q 4hr X 4, if remains red, continue Q 4hrs  Escalate  MEWS: Escalate Red: discuss with charge nurse/RN and provider, consider discussing with RRT  Notify: Charge Nurse/RN  Name of Charge Nurse/RN Notified Ann Dillard  Date Charge Nurse/RN Notified 02/08/22  Time Charge Nurse/RN Notified 2228  Notify: Rapid Response  Name of Rapid Response RN Notified Dave RR  Date Rapid Response Notified 02/08/22  Time Rapid Response Notified 2228  Document  Patient Outcome Other (Comment) (on going assessment)  Progress note created (see row info) Yes  Assess: SIRS CRITERIA  SIRS Temperature  0  SIRS Pulse 1  SIRS Respirations  0  SIRS WBC 1  SIRS Score Sum  2

## 2022-02-08 NOTE — TOC Initial Note (Signed)
Transition of Care [facility]) - Initial/Assessment Note    Patient Details  Name: [name] MRN: [id number] Date of Birth: 1941/03/21  Transition of Care [facility]) CM/SW Contact:    Ella Bodo, RN Phone Number: 02/08/2022, 4:51 PM  Clinical Narrative:                 81 year old male who presented after a fall. Found to have R rib fxs 5-8 and pneumothorax with chest tube placement.  PTA, patient independent and living at home with spouse.  OT recommending [place] follow up; await PT evaluation.  Spouse able to provide needed assistance at home.   Expected Discharge Plan: [place] Barriers to Discharge: Continued Medical Work up          Expected Discharge Plan and Services Expected Discharge Plan: [place]   Discharge Planning Services: CM Consult   Living arrangements for the past 2 months: Single Family Home                                      Prior Living Arrangements/Services Living arrangements for the past 2 months: Single Family Home Lives with:: Spouse Patient language and need for interpreter reviewed:: Yes Do you feel safe going back to the place where you live?: Yes      Need for Family Participation in Patient Care: Yes (Comment) Care giver support system in place?: Yes (comment)   Criminal Activity/Legal Involvement Pertinent to Current Situation/Hospitalization: No - Comment as needed  Activities of Daily Living Home Assistive Devices/Equipment: Eyeglasses, Dentures (specify type) (upper and lower) ADL Screening (condition at time of admission) Patient's cognitive ability adequate to safely complete daily activities?: Yes Is the patient deaf or have difficulty hearing?: No Does the patient have difficulty seeing, even when wearing glasses/contacts?: No Does the patient have difficulty concentrating, remembering, or making decisions?: No Patient able to express need for assistance with ADLs?: Yes Does the  patient have difficulty dressing or bathing?: No Independently performs ADLs?: Yes (appropriate for developmental age) Does the patient have difficulty walking or climbing stairs?: No Weakness of Legs: None Weakness of Arms/Hands: None                 Emotional Assessment   Attitude/Demeanor/Rapport: Engaged Affect (typically observed): Accepting Orientation: : Oriented to Self, Oriented to Place, Oriented to  Time, Oriented to Situation      Admission diagnosis:  Pneumothorax [J93.9] Traumatic pneumothorax, initial encounter [S27.0XXA] Subcutaneous emphysema, initial encounter ([place]) [T79.7XXA] Patient Active Problem List   Diagnosis Date Noted   Rib fractures 02/06/2022   Pneumothorax 02/06/2022   Venous stasis ulcer of left ankle limited to breakdown of skin with varicose veins ([place]) 09/22/2021   Aortic valve stenosis    Long term (current) use of anticoagulants 01/27/2017   DVT (deep venous thrombosis) ([place]) 12/29/2016   Paroxysmal a-fib: briefly in ED now in NSR 05/24/2013   Aortic calcification ([place]) 05/01/2013   CKD (chronic kidney disease) stage 3, GFR 30-59 ml/min (HCC) 05/01/2013   Bilateral renal artery stenosis (HCC)    HTN (hypertension) 07/15/2012   Intraventricular hemorrhage ([place]) 07/12/2012   Peripheral arterial disease ([place])    Ischemic cardiomyopathy 07/08/2011   CAD (coronary artery disease), native coronary artery 07/07/2011   Subclavian arterial stenosis ([place]) 04/25/2011   BPH (benign prostatic hyperplasia) 12/29/2010   COPD (chronic obstructive pulmonary disease) ([place])  01/08/2010   Abdominal aortic aneurysm ([place]) 11/06/2009   HYPERCHOLESTEROLEMIA 10/09/2009   History of prior cigarette smoking 04/11/2008   Hypothyroidism 09/07/2006   PCP:  Owens Loffler, MD Pharmacy:   [facility] [place] [address] Phone: [phone] Fax: [phone]  [id number] [place] [place]  [id number]: [phone]: [phone]    Social Determinants of Health (SDOH) Interventions    Readmission Risk Interventions     No data to display         JReinaldo Raddle RN, BSN  Trauma/Neuro ICU Case Manager [phone]

## 2022-02-08 NOTE — Progress Notes (Signed)
Dave, RR assessed pt, stroke screen negative.  Trauma nurse rounding, made aware of patient status.  Trauma at bedside.

## 2022-02-08 NOTE — Progress Notes (Signed)
  X-cover Note: RN reports pt in rapid afib.  Ekg confirms. Pt with hx of PAF. On coumadin.  Will start cardizem gtts. Hold norvasc.     Kristopher Oppenheim, DO [facility]

## 2022-02-08 NOTE — TOC CAGE-AID Note (Signed)
Transition of Care [facility]) - CAGE-AID Screening   Patient Details  Name: [name] MRN: [id number] Date of Birth: 30-Nov-1940  [facility]) CM/SW Contact:    Clovis Cao, RN Phone Number: 02/08/2022, 3:42 PM   Clinical Narrative: Pt here after sustaining rib fractures with a pneumothorax after a fall.  Pt denies alcohol or drug use.  Screening complete.   CAGE-AID Screening:    Have You Ever Felt You Ought to Cut Down on Your Drinking or Drug Use?: No Have People Annoyed You By Critizing Your Drinking Or Drug Use?: No Have You Felt Bad Or Guilty About Your Drinking Or Drug Use?: No Have You Ever Had a Drink or Used Drugs First Thing In The Morning to Steady Your Nerves or to Get Rid of a Hangover?: No CAGE-AID Score: 0  Substance Abuse Education Offered: No

## 2022-02-08 NOTE — Plan of Care (Signed)
  Problem: Education: Goal: Knowledge of General Education information will improve Description: Including pain rating scale, medication(s)/side effects and non-pharmacologic comfort measures Outcome: Progressing   Problem: Health Behavior/Discharge Planning: Goal: Ability to manage health-related needs will improve Outcome: Progressing   Problem: Clinical Measurements: Goal: Will remain free from infection Outcome: Progressing Goal: Respiratory complications will improve Outcome: Progressing Goal: Cardiovascular complication will be avoided Outcome: Progressing   Problem: Activity: Goal: Risk for activity intolerance will decrease Outcome: Progressing   Problem: Nutrition: Goal: Adequate nutrition will be maintained Outcome: Progressing   Problem: Elimination: Goal: Will not experience complications related to bowel motility Outcome: Progressing Goal: Will not experience complications related to urinary retention Outcome: Progressing   Problem: Pain Managment: Goal: General experience of comfort will improve Outcome: Progressing   Problem: Safety: Goal: Ability to remain free from injury will improve Outcome: Progressing

## 2022-02-08 NOTE — Progress Notes (Signed)
While resting in bed pt has had two separate episodes of heart rates in the 170s quickly retuning to a normal rate. Pt asymptomatic.  MD notified

## 2022-02-08 NOTE — Progress Notes (Addendum)
Trauma Event Note    TRN at bedside to round. Patient with new onset confusion, AFIB RVR with no hx of same. Primary RN already paged RR RN and completed NIH, paged hospitalist and added Cardizem. Unable to titrate much d/t hypotension. Dr. Grandville Silos made aware of status change by this RN via text, no new orders given. Given 500 ML bolus for hypotension without much change. Hypoxia, Spo2 92% on 15L HFNC. Hx COPD. Defer to primary service.  Last imported Vital Signs BP (!) 88/56   Pulse (!) 119   Temp 99 F (37.2 C) (Oral)   Resp 19   Ht '5\' 9"'$  (1.753 m)   Wt 162 lb 15.8 oz (73.9 kg)   SpO2 93%   BMI 24.07 kg/m   Trending CBC Recent Labs    02/06/22 1259 02/07/22 0320 02/08/22 0024  WBC 10.3 7.6 8.0  HGB 14.6 13.7 14.5  HCT 47.2 44.3 44.8  PLT 237 217 206    Trending Coag's Recent Labs    02/06/22 1259 02/07/22 0320 02/08/22 0024  INR 2.0* 2.2* 2.7*    Trending BMET Recent Labs    02/06/22 1259 02/07/22 0320 02/08/22 0024  NA 138 140 139  K 4.6 4.6 4.7  CL 105 106 106  CO2 '26 26 26  '$ BUN 25* 21 28*  CREATININE 1.51* 1.52* 1.90*  GLUCOSE 107* 98 103*      [name]  Trauma Response RN  Please call TRN at [phone] for further assistance.

## 2022-02-08 NOTE — Progress Notes (Signed)
Pt in Afib RVR, rates 120-170's sustained.  Bp 93/53.  Pt denies CP, SOB.  EKG done, confirm AFib RVR, Dr. Bridgett Larsson paged.

## 2022-02-09 ENCOUNTER — Inpatient Hospital Stay (HOSPITAL_COMMUNITY): Payer: Medicare Other

## 2022-02-09 ENCOUNTER — Encounter (HOSPITAL_COMMUNITY): Payer: Self-pay | Admitting: Internal Medicine

## 2022-02-09 DIAGNOSIS — J9811 Atelectasis: Secondary | ICD-10-CM

## 2022-02-09 DIAGNOSIS — I10 Essential (primary) hypertension: Secondary | ICD-10-CM

## 2022-02-09 DIAGNOSIS — J811 Chronic pulmonary edema: Secondary | ICD-10-CM

## 2022-02-09 DIAGNOSIS — J431 Panlobular emphysema: Secondary | ICD-10-CM

## 2022-02-09 DIAGNOSIS — J9601 Acute respiratory failure with hypoxia: Secondary | ICD-10-CM

## 2022-02-09 DIAGNOSIS — J80 Acute respiratory distress syndrome: Secondary | ICD-10-CM

## 2022-02-09 DIAGNOSIS — J986 Disorders of diaphragm: Secondary | ICD-10-CM

## 2022-02-09 DIAGNOSIS — I255 Ischemic cardiomyopathy: Secondary | ICD-10-CM

## 2022-02-09 DIAGNOSIS — R9431 Abnormal electrocardiogram [ECG] [EKG]: Secondary | ICD-10-CM

## 2022-02-09 DIAGNOSIS — N1831 Chronic kidney disease, stage 3a: Secondary | ICD-10-CM

## 2022-02-09 DIAGNOSIS — S270XXA Traumatic pneumothorax, initial encounter: Secondary | ICD-10-CM | POA: Diagnosis not present

## 2022-02-09 DIAGNOSIS — T797XXA Traumatic subcutaneous emphysema, initial encounter: Secondary | ICD-10-CM

## 2022-02-09 LAB — CBC WITH DIFFERENTIAL/PLATELET
Abs Immature Granulocytes: 0.03 10*3/uL (ref 0.00–0.07)
Basophils Absolute: 0 10*3/uL (ref 0.0–0.1)
Basophils Relative: 0 %
Eosinophils Absolute: 0.2 10*3/uL (ref 0.0–0.5)
Eosinophils Relative: 2 %
HCT: 40.6 % (ref 39.0–52.0)
Hemoglobin: 13.1 g/dL (ref 13.0–17.0)
Immature Granulocytes: 0 %
Lymphocytes Relative: 14 %
Lymphs Abs: 1.2 10*3/uL (ref 0.7–4.0)
MCH: 29.2 pg (ref 26.0–34.0)
MCHC: 32.3 g/dL (ref 30.0–36.0)
MCV: 90.6 fL (ref 80.0–100.0)
Monocytes Absolute: 0.6 10*3/uL (ref 0.1–1.0)
Monocytes Relative: 7 %
Neutro Abs: 6.5 10*3/uL (ref 1.7–7.7)
Neutrophils Relative %: 77 %
Platelets: 193 10*3/uL (ref 150–400)
RBC: 4.48 MIL/uL (ref 4.22–5.81)
RDW: 14.7 % (ref 11.5–15.5)
WBC: 8.5 10*3/uL (ref 4.0–10.5)
nRBC: 0 % (ref 0.0–0.2)

## 2022-02-09 LAB — ECHOCARDIOGRAM COMPLETE
AR max vel: 1.27 cm2
AV Area VTI: 1.24 cm2
AV Area mean vel: 1.19 cm2
AV Mean grad: 16.5 mmHg
AV Peak grad: 28.4 mmHg
Ao pk vel: 2.67 m/s
Area-P 1/2: 4.58 cm2
Calc EF: 53.4 %
Height: 69 in
S' Lateral: 4 cm
Single Plane A2C EF: 56.6 %
Single Plane A4C EF: 49.7 %
Weight: 2607.78 oz

## 2022-02-09 LAB — PROTIME-INR
INR: 2.8 — ABNORMAL HIGH (ref 0.8–1.2)
Prothrombin Time: 29.6 seconds — ABNORMAL HIGH (ref 11.4–15.2)

## 2022-02-09 LAB — BASIC METABOLIC PANEL
Anion gap: 10 (ref 5–15)
BUN: 36 mg/dL — ABNORMAL HIGH (ref 8–23)
CO2: 23 mmol/L (ref 22–32)
Calcium: 8.9 mg/dL (ref 8.9–10.3)
Chloride: 107 mmol/L (ref 98–111)
Creatinine, Ser: 1.77 mg/dL — ABNORMAL HIGH (ref 0.61–1.24)
GFR, Estimated: 38 mL/min — ABNORMAL LOW (ref >60)
Glucose, Bld: 108 mg/dL — ABNORMAL HIGH (ref 70–99)
Potassium: 4.3 mmol/L (ref 3.5–5.1)
Sodium: 140 mmol/L (ref 135–145)

## 2022-02-09 LAB — BLOOD GAS, ARTERIAL
Acid-base deficit: 2.8 mmol/L — ABNORMAL HIGH (ref 0.0–2.0)
Bicarbonate: 23.2 mmol/L (ref 20.0–28.0)
Drawn by: 12971
O2 Saturation: 93.6 %
Patient temperature: 37
pCO2 arterial: 44 mmHg (ref 32–48)
pH, Arterial: 7.33 — ABNORMAL LOW (ref 7.35–7.45)
pO2, Arterial: 62 mmHg — ABNORMAL LOW (ref 83–108)

## 2022-02-09 LAB — PROCALCITONIN: Procalcitonin: 0.25 ng/mL

## 2022-02-09 LAB — BRAIN NATRIURETIC PEPTIDE: B Natriuretic Peptide: 350.9 pg/mL — ABNORMAL HIGH (ref 0.0–100.0)

## 2022-02-09 MED ORDER — WARFARIN SODIUM 2.5 MG PO TABS
2.5000 mg | ORAL_TABLET | Freq: Once | ORAL | Status: AC
  Administered 2022-02-09: 2.5 mg via ORAL
  Filled 2022-02-09: qty 1

## 2022-02-09 MED ORDER — FUROSEMIDE 10 MG/ML IJ SOLN: 40.0000 mg | Freq: Once | INTRAMUSCULAR | Status: DC

## 2022-02-09 MED ORDER — IPRATROPIUM-ALBUTEROL 0.5-2.5 (3) MG/3ML IN SOLN
3.0000 mL | Freq: Four times a day (QID) | RESPIRATORY_TRACT | Status: DC
  Administered 2022-02-09 – 2022-02-10 (×3): 3 mL via RESPIRATORY_TRACT
  Filled 2022-02-09 (×3): qty 3

## 2022-02-09 NOTE — Progress Notes (Signed)
Subjective: Had a rough night last night requiring 15L HFNC along with some confusion and a fib with hypotension possibly secondary to Cardizem.  Patient overall improving some this morning.  Denies SOB.  ROS: See above, otherwise other systems negative  Objective: Vital signs in last 24 hours: Temp:  [97.9 F (36.6 C)-99.1 F (37.3 C)] 98 F (36.7 C) (10/18 0300) Pulse Rate:  [90-124] 105 (10/18 0330) Resp:  [13-26] 14 (10/18 0330) BP: (88-131)/(51-79) 90/61 (10/18 0330) SpO2:  [90 %-95 %] 95 % (10/18 0330) FiO2 (%):  [36 %] 36 % (10/17 0829) Last BM Date : 02/05/22  Intake/Output from previous day: 10/17 0701 - 10/18 0700 In: 20 [I.V.:10] Out: 51 [Urine:500; Drains:110] Intake/Output this shift: Total I/O In: 10 [I.V.:10] Out: 510 [Urine:500; Drains:10]  PE: Gen: NAD HEENT: facial edema is much improved.  PERRL Heart: regular Lungs: moving good air.  Less wheezy today than yesterday.  No air leak.  CT with 100cc of ss output overnight.  Placed to waterseal Abd: soft, NT, ND Ext: MAE, B upper arms with crepitus down to hands Psych: A&Ox3    Lab Results:  Recent Labs    02/07/22 0320 02/08/22 0024  WBC 7.6 8.0  HGB 13.7 14.5  HCT 44.3 44.8  PLT 217 206   BMET Recent Labs    02/07/22 0320 02/08/22 0024  NA 140 139  K 4.6 4.7  CL 106 106  CO2 26 26  GLUCOSE 98 103*  BUN 21 28*  CREATININE 1.52* 1.90*  CALCIUM 9.0 8.7*   PT/INR Recent Labs    02/08/22 0024 02/09/22 0024  LABPROT 28.7* 29.6*  INR 2.7* 2.8*   CMP     Component Value Date/Time   NA 139 02/08/2022 0024   NA 142 05/06/2021 0953   NA 136 06/03/2013 0454   K 4.7 02/08/2022 0024   K 4.1 06/03/2013 0454   CL 106 02/08/2022 0024   CL 106 06/03/2013 0454   CO2 26 02/08/2022 0024   CO2 27 06/03/2013 0454   GLUCOSE 103 (H) 02/08/2022 0024   GLUCOSE 80 06/03/2013 0454   BUN 28 (H) 02/08/2022 0024   BUN 22 05/06/2021 0953   BUN 22 (H) 06/03/2013 0454   CREATININE 1.90 (H)  02/08/2022 0024   CREATININE 1.59 (H) 06/03/2013 0454   CREATININE 1.02 04/22/2011 1507   CALCIUM 8.7 (L) 02/08/2022 0024   CALCIUM 8.8 06/03/2013 0454   PROT 6.1 (L) 02/08/2022 0024   PROT 6.5 01/31/2014 0756   PROT 6.7 06/03/2013 0454   ALBUMIN 3.3 (L) 02/08/2022 0024   ALBUMIN 4.2 01/31/2014 0756   ALBUMIN 2.1 (L) 06/03/2013 0454   AST 26 02/08/2022 0024   AST 34 06/03/2013 0454   ALT 15 02/08/2022 0024   ALT 52 06/03/2013 0454   ALKPHOS 76 02/08/2022 0024   ALKPHOS 97 06/03/2013 0454   BILITOT 1.3 (H) 02/08/2022 0024   BILITOT 0.6 06/03/2013 0454   GFRNONAA 35 (L) 02/08/2022 0024   GFRNONAA 43 (L) 06/03/2013 0454   GFRAA 59 (L) 12/30/2016 0103   GFRAA 50 (L) 06/03/2013 0454   Lipase     Component Value Date/Time   LIPASE 55 (H) 02/06/2022 1259       Studies/Results: DG CHEST PORT 1 VIEW  Result Date: 02/08/2022 CLINICAL DATA:  Right-sided pneumothorax EXAM: PORTABLE CHEST 1 VIEW COMPARISON:  Chest x-ray dated February 07, 2022 FINDINGS: Unchanged position of right-sided chest tube. Cardiac and mediastinal contours are  unchanged post median sternotomy and CABG. Bibasilar atelectasis. Unchanged mild pneumomediastinum and diffuse soft tissue emphysema. No visible pneumothorax. No large pleural effusion. IMPRESSION: 1. Unchanged position of right-sided chest tube. No visible pneumothorax. 2. Unchanged mild pneumomediastinum and diffuse soft tissue emphysema. Electronically Signed   By: Yetta Glassman M.D.   On: 02/08/2022 08:20   DG CHEST PORT 1 VIEW  Result Date: 02/07/2022 CLINICAL DATA:  Pneumothorax tracks. EXAM: PORTABLE CHEST 1 VIEW COMPARISON:  AP chest 02/07/2022 at 8:07 a.m., AP chest 02/06/2022 (multiple studies) FINDINGS: Single frontal view of the chest 02/07/2022 at 6:08 p.m. Small bore right pigtail catheter again projects over the inferior right lung. Within the limitations of high-grade subcutaneous air throughout the bilateral chest walls and neck, no  definitive right pneumothorax is visualized, however it is difficult to exclude a tiny right apical pneumothorax. These findings appear not significantly changed from 02/07/2022 07 a.m. earlier today. Status post median sternotomy and CABG. Vascular stent overlies the proximal left subclavian artery. Mild high-grade calcification within the aortic arch. Cardiac silhouette is mildly to moderately enlarged. Mildly decreased lung volumes. Likely bibasilar bronchovascular crowding, similar to prior. No acute skeletal abnormality. IMPRESSION: 1. No significant change from 02/07/2022 at 8:07 AM. 2. Small bore right pigtail catheter remains in place. No definitive right pneumothorax is visualized. 3. Similar bibasilar atelectasis. Electronically Signed   By: Yvonne Kendall M.D.   On: 02/07/2022 18:58   DG Chest Port 1 View  Result Date: 02/07/2022 CLINICAL DATA:  81 year old male with history of pneumothorax. Chest tube. EXAM: PORTABLE CHEST 1 VIEW COMPARISON:  Chest x-ray 02/06/2022. FINDINGS: Small bore chest tube with pigtail reformed projecting over the lower right hemithorax. Previously noted right-sided pneumothorax is no longer readily apparent. Lung volumes are low. Bibasilar opacities favored to reflect areas of subsegmental atelectasis. No consolidative airspace disease. No definite pleural effusions. No evidence of pulmonary edema. Heart size is normal. Upper mediastinal contours are within normal limits. Atherosclerotic calcifications are noted in the thoracic aorta. Status post median sternotomy for CABG. Extensive subcutaneous and intramuscular gas noted throughout the thorax bilaterally tracking cephalad into the cervical regions bilaterally. IMPRESSION: 1. Resolution of previously noted right-sided pneumothorax following right-sided chest tube placement. 2. Low lung volumes with bibasilar subsegmental atelectasis. 3. Aortic atherosclerosis. Electronically Signed   By: Vinnie Langton M.D.   On:  02/07/2022 08:15    Anti-infectives: Anti-infectives (From admission, onward)    None        Assessment/Plan Fall R rib fxs 5-8 - pulm toilet, pain control, IS R PTX/significant diffuse subq emphysema - s/p chest tube placement.  No air leak noted, but with significant subq emphysema noted into his neck, face, and down both arms as well as other side of his chest.  CXR today shows resolution of PTX.  Aware of events overnight, but lung has been up for 48 hrs and PTX etiology is unlikely to be contributing to overnight issues, given it's resolution on plain film.  Will try waterseal, but repeat a CXR at 1000am this morning to assure stability.  If any issues will return to suction. Acute hypoxic respiratory failure - per medicine A fib on anticoagulation - INR 2.7 this am, per medicine/pharmacy CAD PAC Ischemic cardiomyopathy AAA AV stenosis DVT - on coumadin HTN CKD HLD COPD Hypothyroidism --above per medicine--  FEN - regular VTE - coumadin ID - none currently needed  I reviewed hospitalist notes, last 24 h vitals and pain scores, last 48 h intake and  output, last 24 h labs and trends, and last 24 h imaging results.   LOS: 2 days    Henreitta Cea , [facility] 02/09/2022, 6:36 AM Please see Amion for pager number during day hours 7:00am-4:30pm or 7:00am -11:30am on weekends

## 2022-02-09 NOTE — Plan of Care (Signed)

## 2022-02-09 NOTE — Plan of Care (Signed)

## 2022-02-09 NOTE — Progress Notes (Signed)
Trauma Event Note   Rounding on pt-- pt's wife at bedside, pt sitting up in bed with O2 on at 14L/[place] heated high flow--  Pt states "I am pissed off" when asked why, he replied "I am stuck in this bed and can't get up. And no one is doing anything"  Explained care to pt and his wife. No further questions. Primary RN also at bedside.  Encouraged pt to use flutter valve, and requested incentive spirometer.   Last imported Vital Signs BP 106/78 (BP Location: Right Arm)   Pulse 100   Temp 98 F (36.7 C) (Oral)   Resp 14   Ht '5\' 9"'$  (1.753 m)   Wt 162 lb 15.8 oz (73.9 kg)   SpO2 94%   BMI 24.07 kg/m   Trending CBC Recent Labs    02/07/22 0320 02/08/22 0024 02/09/22 0024  WBC 7.6 8.0 8.5  HGB 13.7 14.5 13.1  HCT 44.3 44.8 40.6  PLT 217 206 193    Trending Coag's Recent Labs    02/07/22 0320 02/08/22 0024 02/09/22 0024  INR 2.2* 2.7* 2.8*    Trending BMET Recent Labs    02/07/22 0320 02/08/22 0024 02/09/22 0024  NA 140 139 140  K 4.6 4.7 4.3  CL 106 106 107  CO2 '26 26 23  '$ BUN 21 28* 36*  CREATININE 1.52* 1.90* 1.77*  GLUCOSE 98 103* 108*      [name] M [name]  Trauma Response RN  Please call TRN at [phone] for further assistance.

## 2022-02-09 NOTE — Progress Notes (Signed)
[place] for warfarin Indication: Hx VTE/AF  Allergies  Allergen Reactions   Plavix [Clopidogrel Bisulfate]     Brain hemorrhage prev while on plavix and aspirin    Patient Measurements: Height: '5\' 9"'$  (175.3 cm) Weight: 73.9 kg (162 lb 15.8 oz) IBW/kg (Calculated) : 70.7   Vital Signs: Temp: 98 F (36.7 C) (10/18 0758) Temp Source: Oral (10/18 0758) BP: 91/59 (10/18 0758) Pulse Rate: 97 (10/18 0758)  Labs: Recent Labs    02/07/22 0320 02/08/22 0024 02/09/22 0024  HGB 13.7 14.5 13.1  HCT 44.3 44.8 40.6  PLT 217 206 193  LABPROT 24.5* 28.7* 29.6*  INR 2.2* 2.7* 2.8*  CREATININE 1.52* 1.90* 1.77*     Estimated Creatinine Clearance: 33.3 mL/min (A) (by C-G formula based on SCr of 1.77 mg/dL (H)).   Medical History: Past Medical History:  Diagnosis Date   (HFimpEF) heart failure with improved ejection fraction ([place])    a. 06/2011 Echo: EF 35-40%; b. 06/2012 Echo: EF 50%; c. 12/2016 Echo: EF 55-60%, no rwma, GRI DD, mild AS/MR, nl RV fxn, nl RVSP; d. 02/2021 Echo: EF 55%, GrII DD. Mild MR. Mild to mod TR. Sev AS by VTI.   AAA (abdominal aortic aneurysm) ([place])    a. 05/2020 Abd u/s: 3.6cm.   Aortic calcification (HCC) 05/01/2013   Bilateral renal artery stenosis ([place])    a. 06/2013 Angio/PTA: LRA: 95 (5x18 Herculink stent), RRA 60ost; b. 04/2021 Angio: mod RRA stenosis w/ patent LRA stent.   Carotid arterial disease ([place])    a. 05/2020 Carotid U/S: RICA 1-70%, LICA 01-74%   Coronary artery disease    a. 2013 s/p CABG x 3 (LIMA->LAD, VG->OM, VG->RPDA; b. 06/2012 MV: no ischemia; c. 02/2021 Cath: LM 100, RCA 90ost, 158m free LIMA->LAD nl, VG->OM2 nl, VG->RPDA nl. Mod AS (mean grad 150mg, AVA 1.1cm^2). RHC w/ elev filling pressures.   History of prior cigarette smoking 04/11/2008   Qualifier: Diagnosis of  By: BeDiona BrownerD, Amy     Hypertension    Hypothyroidism    Intraventricular hemorrhage ([place]03/20/2014   Ischemic cardiomyopathy     a. 06/2011 Echo: EF 35-40%; b. 06/2012 Echo: EF 50%; c. 12/2016 Echo: EF 55-60%, no rwma, GRI DD; d. 02/2021 Echo: Ef 55%, GrII DD.   Moderate aortic stenosis    a. 02/2021 Echo: EF 55%, GrII DD. Sev Ca2+ of AoV. Sev AS w/ AVA by VTI 0.79cm^2; b. 02/2021 Cath: Mod AS w/ mean grad 1521m. AVA 1.1cm^2.   Peripheral arterial disease ([place]  a. Previous left lower extremity stenting by Dr. SanJamal Collinb. 12/2012 s/p bilat ostial common iliac stenting; c. 04/2021 Angio: Patent LRA stent, mod RRA stenosis. Small AAA. Sev plaque RSFA 2/ subtl occl of inf branch of profunda. Diff Ca2+ SFA dzs w/ collats from profunda and 3V runoff. Diffuse LSFA dzs w/ collats from profunda. Subtl PT dzs. 3V runoff-->Med Rx.   Right leg DVT ([place]4/21/2014   Subclavian artery stenosis, left ([place]CC[place]2/2014   Status post stenting of the ostium and self-expanding stent placement to the left axillary artery   Venous insufficiency     Assessment: [age]M presenting s/p fall, hx VTE and pAfib on warfarin PTA.  Pharmacy consulted to restart home warfarin. OK to continue warfarin per surgery/IR  PTA dosing: 2.'5mg'$  daily except '5mg'$  MWF  INR continues to trend up to 2.8 this morning after low dose given last night. Last warfarin dose as documented per  med history on 10/14. CBC stable overnight.   Goal of Therapy:  INR 2-3 Monitor platelets by anticoagulation protocol: Yes   Plan:  Given trend up will give lower dose today - 2.'5mg'$  Daily INR, s/s bleeding  Erin Hearing PharmD., BCPS Clinical Pharmacist 02/09/2022 9:13 AM

## 2022-02-09 NOTE — TOC Progression Note (Signed)
Transition of Care [facility]) - Progression Note    Patient Details  Name: [name] MRN: [id number] Date of Birth: 06/23/1940  Transition of Care [facility]) CM/SW Contact  Carles Collet, RN Phone Number: 02/09/2022, 3:18 PM  Clinical Narrative:     Damaris Schooner w patient and wife at bedside. Patient from home, has 1-2 steps to enter home, lives on single level. May need tub bench for home. Reviewed available option from hospital with wife, and reviewed that it would be out of pocket. We also looked at options online, and she would like to continue looking at options to order herself. They have multiple walkers and canes at home available.  She was provided with Medicare.org list of [place] agencies with ratings. TOC will need to follow up with wife on choice for [facility] provider.   Expected Discharge Plan: [place] Barriers to Discharge: Continued Medical Work up  Expected Discharge Plan and Services Expected Discharge Plan: [place]   Discharge Planning Services: CM Consult   Living arrangements for the past 2 months: Single Family Home                                       Social Determinants of Health (SDOH) Interventions    Readmission Risk Interventions     No data to display

## 2022-02-09 NOTE — Evaluation (Addendum)
Physical Therapy Evaluation Patient Details Name: [name] MRN: [id number] DOB: Aug 17, 1940 Today's Date: 02/09/2022  History of Present Illness  81 year old male who presented after a fall. Found to have R rib fxs 5-8 and pneumothorax with chest tube placement. PMHx: CAD status post CABG, ischemic cardiomyopathy, HTN, HLD, hypothyroidism, CKD 3A with baseline creatinine around 1.5, PAF on chronic Coumadin   Clinical Impression  Pt admitted with above diagnosis. PTA pt lived at home with his wife, independent. Pt currently with functional limitations due to the deficits listed below (see PT Problem List). On eval, he required min assist supine to sit, mod assist sit to stand, and mod assist step pivot transfer bed to recliner. Pt declined gait due to feeling poorly, with c/o SOB. Pt on 15L O2 via HFNC. SpO2 93%. HR 90s-110s, in a fib. BP low but stable. R CT on waterseal. Pt will benefit from skilled PT to increase their independence and safety with mobility to allow discharge to the venue listed below. Suspect pt will progress quickly with mobility as med status improves.         Recommendations for follow up therapy are one component of a multi-disciplinary discharge planning process, led by the attending physician.  Recommendations may be updated based on patient status, additional functional criteria and insurance authorization.  Follow Up Recommendations Home health PT      Assistance Recommended at Discharge Frequent or constant Supervision/Assistance  Patient can return home with the following  A little help with walking and/or transfers;A little help with bathing/dressing/bathroom;Help with stairs or ramp for entrance;Assist for transportation;Assistance with cooking/housework    Equipment Recommendations None recommended by PT  Recommendations for Other Services       Functional Status Assessment Patient has had a recent decline in their functional status and demonstrates the  ability to make significant improvements in function in a reasonable and predictable amount of time.     Precautions / Restrictions Precautions Precautions: Fall;Other (comment) Precaution Comments: R chest tube      Mobility  Bed Mobility Overal bed mobility: Needs Assistance Bed Mobility: Supine to Sit     Supine to sit: Min assist, HOB elevated     General bed mobility comments: assist to elevate trunk, +rail, increased time    Transfers Overall transfer level: Needs assistance Equipment used: 1 person hand held assist Transfers: Sit to/from Stand, Bed to chair/wheelchair/BSC Sit to Stand: Mod assist   Step pivot transfers: Mod assist       General transfer comment: assist to power up and stabilize balance. Pivot steps bed to recliner, therapist anterior to pt providing BUE support    Ambulation/Gait               General Gait Details: Pt declining gait  Stairs            Wheelchair Mobility    Modified Rankin (Stroke Patients Only)       Balance Overall balance assessment: Needs assistance Sitting-balance support: Feet supported, No upper extremity supported Sitting balance-Leahy Scale: Good     Standing balance support: Bilateral upper extremity supported, During functional activity Standing balance-Leahy Scale: Poor Standing balance comment: reliant on external support                             Pertinent Vitals/Pain Pain Assessment Pain Assessment: Faces Faces Pain Scale: Hurts a little bit Pain Location: R flank with mobility Pain Descriptors / Indicators:  Grimacing, Discomfort Pain Intervention(s): Monitored during session, Repositioned    Home Living Family/patient expects to be discharged to:: Private residence Living Arrangements: Spouse/significant other Available Help at Discharge: Family;Available 24 hours/day Type of Home: House Home Access: Stairs to enter Entrance Stairs-Rails: Counsellor of Steps: 3   Home Layout: One level Home Equipment: Conservation officer, nature (2 wheels);BSC/3in1;Cane - single point      Prior Function Prior Level of Function : Independent/Modified Independent;Driving                     Hand Dominance   Dominant Hand: Left    Extremity/Trunk Assessment   Upper Extremity Assessment Upper Extremity Assessment: Defer to OT evaluation    Lower Extremity Assessment Lower Extremity Assessment: Generalized weakness    Cervical / Trunk Assessment Cervical / Trunk Exceptions: R rib fxs and chest tube  Communication   Communication: No difficulties  Cognition Arousal/Alertness: Awake/alert Behavior During Therapy: WFL for tasks assessed/performed Overall Cognitive Status: Impaired/Different from baseline Area of Impairment: Attention, Memory, Following commands, Safety/judgement, Awareness, Problem solving                   Current Attention Level: Selective Memory: Decreased short-term memory Following Commands: Follows one step commands with increased time Safety/Judgement: Decreased awareness of deficits, Decreased awareness of safety Awareness: Emergent Problem Solving: Slow processing, Difficulty sequencing, Requires verbal cues          General Comments General comments (skin integrity, edema, etc.): Pt on 15L HFNC. HR 90s-110s. SpO2 93%. BP stable    Exercises     Assessment/Plan    PT Assessment Patient needs continued PT services  PT Problem List Decreased strength;Decreased mobility;Decreased safety awareness;Decreased activity tolerance;Decreased balance;Cardiopulmonary status limiting activity;Decreased cognition;Pain       PT Treatment Interventions DME instruction;Therapeutic activities;Cognitive remediation;Gait training;Therapeutic exercise;Patient/family education;Balance training;Stair training;Functional mobility training    PT Goals (Current goals can be found in the Care Plan section)   Acute Rehab PT Goals Patient Stated Goal: home PT Goal Formulation: With patient Time For Goal Achievement: 02/23/22 Potential to Achieve Goals: Good    Frequency Min 3X/week     Co-evaluation               AM-PAC PT "6 Clicks" Mobility  Outcome Measure Help needed turning from your back to your side while in a flat bed without using bedrails?: A Little Help needed moving from lying on your back to sitting on the side of a flat bed without using bedrails?: A Little Help needed moving to and from a bed to a chair (including a wheelchair)?: A Lot Help needed standing up from a chair using your arms (e.g., wheelchair or bedside chair)?: A Lot Help needed to walk in hospital room?: A Lot Help needed climbing 3-5 steps with a railing? : Total 6 Click Score: 13    End of Session Equipment Utilized During Treatment: Gait belt;Oxygen Activity Tolerance: Treatment limited secondary to medical complications (Comment) (increased O2 needs, SOB/DOE) Patient left: in chair;with call bell/phone within reach Nurse Communication: Mobility status;Other (comment) (IV out) PT Visit Diagnosis: Muscle weakness (generalized) (M62.81);Difficulty in walking, not elsewhere classified (R26.2)    Time: 7253-6644 PT Time Calculation (min) (ACUTE ONLY): 38 min   Charges:   PT Evaluation $PT Eval Moderate Complexity: 1 Mod PT Treatments $Therapeutic Activity: 8-22 mins        Lorrin Goodell, PT  Office # [phone] Pager [phone]   Lorriane Shire 02/09/2022,  10:21 AM

## 2022-02-09 NOTE — Progress Notes (Signed)
OT Cancellation Note  Patient Details Name: MARKOS THEIL MRN: [id number] DOB: 1940-10-01   Cancelled Treatment:    Reason Eval/Treat Not Completed: Other (comment) (pt with RT in room, will reattempt as schedule permits)  Lynnda Child, OTD, OTR/L Acute Rehab [phone]   Kaylyn Lim 02/09/2022, 2:55 PM

## 2022-02-09 NOTE — Consult Note (Addendum)
NAME:  [name], MRN:  [id number], DOB:  [birth year], LOS: 2 ADMISSION DATE:  02/06/2022, CONSULTATION DATE:  10/18 REFERRING MD:  Venetia Constable, CHIEF COMPLAINT:  progressive hypoxia    History of Present Illness:  81 year old male w/ hx as outlined below. Admitted 10/15 s/p fall where he sustained multiple right sided rib fxs and Right sided PTX w/ fairly sig associate [place] emphysema.  Admitted by IM. Trauma consulted. Imaging reviewed w/ thoracic surg who recommended IR placement of tube given small size and concern about lung not completely re-inflating d/t prior CABG and potential for trapped lung.  A 14 fr CT guided CT was placed by IR on 10/15. Post CT placement showed Resolution of right PT but fairly sig [place] emphysema. Over the following days CT has been left to sxn, serial CXRs cont to show diffuse [place] air, elevated Right HD and no recurrence of PTX. During this time he has had continuous increased FIO2 needs now up to 15 l HFO. His INR has remained therapeutic (he is still on coumadin). PCCM has been asked to evaluate in effort to explain and assist w/ his rising O2 needs.    Pertinent  Medical History  Elevated right HD since his CABG 2014.  CAD w/ prior CABG, ICM, last known EF 55% , CKD stage IIIa, PAF on chronic coumadin, emphysema  Significant Hospital Events: Including procedures, antibiotic start and stop dates in addition to other pertinent events   10/15 admitted after fall c/b rib fxs and right apical PTX w/ diffuse [place] emphysema. CT placed by IR  10/15-10/18 resolution of ptx w/ CT to sxn, pn-going  emphysema; rising O2 needs up to 15 liters.  New onset AF w/ RVR 10/17.   Interim History / Subjective:  No distress.  Only reports pain at site of chest tube.  Objective   Blood pressure (Abnormal) 91/59, pulse 97, temperature 98 F (36.7 C), temperature source Oral, resp. rate 19, height '5\' 9"'$  (1.753 m), weight 73.9 kg, SpO2 94 %.        Intake/Output Summary (Last 24 hours)  at 02/09/2022 6295 Last data filed at 02/09/2022 2841 Gross per 24 hour  Intake 20 ml  Output 760 ml  Net -740 ml   Filed Weights   02/06/22 1508  Weight: 73.9 kg    Examination: General: 81 year old male patient currently sitting at bedside he is in no acute distress, has recently received analgesia. HENT: Normocephalic atraumatic however does have marked subcutaneous emphysema tracking up neck, with phonation changes sounding much more nasal Lungs: Diminished bilaterally.  No wheezing.  Subcutaneous emphysema tracking along the entire thorax both anteriorly and posteriorly.  He has a right chest tube, it is currently at waterseal.  There is no airleak.  I did flush the system to ensure patency Portable chest x-ray personally reviewed shows resolution of right sided anterior pneumothorax.  There is chronically elevated right hemidiaphragm dating back to 2014 post CABG, diffuse subcutaneous emphysema making interstitial evaluation challenging Cardiovascular: Regular irregular atrial fibrillation on telemetry no murmur rub or gallop Abdomen: Soft not tender no organomegaly Extremities: Warm dry some mild subcutaneous emphysema extending into upper extremities scattered areas of ecchymosis Neuro: Awake oriented no focal deficits GU: Voiding  Resolved Hospital Problem list     Assessment & Plan:  Principal Problem:   Acute respiratory failure (HCC) Active Problems:   Elevated diaphragm   Atelectasis   Pulmonary edema   COPD (chronic obstructive pulmonary disease) ([place])  Rib fractures   Pneumothorax   Subcutaneous emphysema (HCC)   Hypothyroidism   HYPERCHOLESTEROLEMIA   Abdominal aortic aneurysm (HCC)   CAD (coronary artery disease), native coronary artery   Ischemic cardiomyopathy   Peripheral arterial disease (HCC)   HTN (hypertension)   CKD (chronic kidney disease) stage 3, GFR 30-59 ml/min (HCC)   DVT (deep venous thrombosis) (HCC)   Aortic valve stenosis  Acute  hypoxic respiratory failure.  Seemingly multifactorial.  I doubt this is pulmonary emboli as he has had therapeutic INR and has remained on warfarin.  This does not appear to be due to acute exacerbation of his underlying COPD.  At this point highest on differential diagnosis would be pulmonary edema, atelectasis, and or pneumonia, pneumonia being less likely but on differential.  He has a chronically elevated right hemidiaphragm. His extensive [place] emphysema makes 1 view imaging very difficult  Plan Sending BNP Sending procalcitonin Echocardiogram is already pending CT chest noncontrasted.  Given the diffuse subcutaneous emphysema evaluation of pulmonary edema, atelectasis, pneumonia, all much more challenging on 1 view film IV Lasix x1 when able Ensure incentive spirometry  Multiple rib fractures on the right with associated traumatic right pneumothorax status post chest tube placement 10/15, with radiographic resolution of pneumothorax but ongoing subcutaneous emphysema -I have flushed left thoracostomy tube to ensure patency. -There is no airleak -Radiographically of the chest tube seems to be in good position Plan Agree with keeping chest tube to waterseal We will follow-up CT imaging  COPD Plan We will add scheduled bronchodilators   Best Practice (right click and "Reselect all SmartList Selections" daily)  Per primary   Labs   CBC: Recent Labs  Lab 02/06/22 1259 02/07/22 0320 02/08/22 0024 02/09/22 0024  WBC 10.3 7.6 8.0 8.5  NEUTROABS 7.9*  --   --  6.5  HGB 14.6 13.7 14.5 13.1  HCT 47.2 44.3 44.8 40.6  MCV 90.6 91.7 90.3 90.6  PLT 237 217 206 329    Basic Metabolic Panel: Recent Labs  Lab 02/06/22 1259 02/07/22 0320 02/08/22 0024 02/09/22 0024  NA 138 140 139 140  K 4.6 4.6 4.7 4.3  CL 105 106 106 107  CO2 '26 26 26 23  '$ GLUCOSE 107* 98 103* 108*  BUN 25* 21 28* 36*  CREATININE 1.51* 1.52* 1.90* 1.77*  CALCIUM 8.8* 9.0 8.7* 8.9  MG  --   --  2.4  --    PHOS  --   --  4.4  --    GFR: Estimated Creatinine Clearance: 33.3 mL/min (A) (by C-G formula based on SCr of 1.77 mg/dL (H)). Recent Labs  Lab 02/06/22 1259 02/07/22 0320 02/08/22 0024 02/09/22 0024  WBC 10.3 7.6 8.0 8.5    Liver Function Tests: Recent Labs  Lab 02/06/22 1259 02/08/22 0024  AST 18 26  ALT 12 15  ALKPHOS 94 76  BILITOT 1.0 1.3*  PROT 7.2 6.1*  ALBUMIN 4.0 3.3*   Recent Labs  Lab 02/06/22 1259  LIPASE 55*   No results for input(s): "AMMONIA" in the last 168 hours.  ABG    Component Value Date/Time   PHART 7.390 05/24/2013 1633   PCO2ART 30.6 (L) 05/24/2013 1633   PO2ART 135.0 (H) 05/24/2013 1633   HCO3 18.5 (L) 05/24/2013 1633   TCO2 19 05/24/2013 1633   ACIDBASEDEF 5.0 (H) 05/24/2013 1633   O2SAT 99.0 05/24/2013 1633     Coagulation Profile: Recent Labs  Lab 02/06/22 1259 02/07/22 0320 02/08/22 0024 02/09/22 0024  INR 2.0* 2.2* 2.7* 2.8*    Cardiac Enzymes: No results for input(s): "CKTOTAL", "CKMB", "CKMBINDEX", "TROPONINI" in the last 168 hours.  HbA1C: Hgb A1c MFr Bld  Date/Time Value Ref Range Status  07/05/2021 08:49 AM 5.5 4.6 - 6.5 % Final    Comment:    Glycemic Control Guidelines for People with Diabetes:Non Diabetic:  <6%Goal of Therapy: <7%Additional Action Suggested:  >8%   04/30/2020 11:57 AM 5.3 4.6 - 6.5 % Final    Comment:    Glycemic Control Guidelines for People with Diabetes:Non Diabetic:  <6%Goal of Therapy: <7%Additional Action Suggested:  >8%     CBG: No results for input(s): "GLUCAP" in the last 168 hours.  Review of Systems:   Review of Systems  Constitutional:  Negative for fever, malaise/fatigue and weight loss.  HENT: Negative.    Eyes: Negative.   Respiratory:  Positive for cough and shortness of breath. Negative for sputum production and wheezing.   Cardiovascular:  Positive for chest pain. Negative for leg swelling.  Gastrointestinal: Negative.   Genitourinary: Negative.    Musculoskeletal: Negative.   Skin: Negative.   Neurological: Negative.   Endo/Heme/Allergies:  Bruises/bleeds easily.  Psychiatric/Behavioral: Negative.       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   Surgical History:   Past Surgical History:  Procedure Laterality Date   ABDOMINAL ANGIOGRAM  07/10/2013   WITH BI-FEMORAL RUNOFF       DR Fletcher Anon   ABDOMINAL AORTAGRAM N/A 12/26/2012   Procedure: ABDOMINAL Maxcine Ham;  Surgeon: Wellington Hampshire, MD;  Location: [facility] CATH LAB;  Service: Cardiovascular;  Laterality: N/A;   ABDOMINAL AORTAGRAM N/A 07/10/2013   Procedure: ABDOMINAL Maxcine Ham;  Surgeon: Wellington Hampshire, MD;  Location: [facility] CATH LAB;  Service: Cardiovascular;  Laterality: N/A;   ABDOMINAL AORTOGRAM W/LOWER EXTREMITY N/A 05/12/2021   Procedure: ABDOMINAL AORTOGRAM W/LOWER EXTREMITY;  Surgeon: Wellington Hampshire, MD;  Location: [facility] CV LAB;  Service: Cardiovascular;  Laterality: N/A;   ANGIOPLASTY / STENTING FEMORAL  05/2012   ANGIOPLASTY / STENTING ILIAC Bilateral 12/26/2012   ARCH AORTOGRAM N/A 06/20/2012   Procedure: ARCH AORTOGRAM;  Surgeon: Wellington Hampshire, MD;  Location: [facility] CATH LAB;  Service: Cardiovascular;  Laterality: N/A;   CARDIAC CATHETERIZATION  05/2012   [place]   CHOLECYSTECTOMY  1990's   CORONARY ARTERY BYPASS GRAFT  07/11/2011   Procedure: CORONARY ARTERY BYPASS GRAFTING (CABG);  Surgeon: Grace Isaac, MD;  Location: [facility];  Service: Open Heart Surgery;  Laterality: N/A;  Times 3. On Pump. Using right greater saphenous vein and left internal mammary artery.    CORONARY ARTERY BYPASS GRAFT     HERNIA REPAIR     INSERTION OF ILIAC STENT Bilateral 12/26/2012   Procedure: INSERTION OF ILIAC STENT;  Surgeon: Wellington Hampshire, MD;  Location: [facility] CATH LAB;  Service: Cardiovascular;  Laterality: Bilateral;  Bilateral Common Iliac Artery   LEFT AND RIGHT HEART CATHETERIZATION WITH CORONARY ANGIOGRAM  07/07/2011   Procedure: LEFT AND RIGHT HEART CATHETERIZATION WITH CORONARY ANGIOGRAM;  Surgeon: Minna Merritts, MD;  Location[facility];  Service: Cardiovascular;;   LOWER EXTREMITY VENOGRAPHY Bilateral 12/30/2016   Procedure: Lower Extremity Venography;  Surgeon: Katha Cabal, MD;  Location: [facility] CV LAB;  Service: Cardiovascular;  Laterality: Bilateral;   PERCUTANEOUS STENT INTERVENTION  06/20/2012   Procedure: PERCUTANEOUS STENT INTERVENTION;  Surgeon: Wellington Hampshire, MD;  Location: [facility] CATH LAB;  Service: Cardiovascular;;   RENAL ARTERY ANGIOPLASTY Left 07/10/2013   DR ARIDA   RIGHT/LEFT HEART CATH AND CORONARY/GRAFT ANGIOGRAPHY N/A 03/15/2021   Procedure: RIGHT/LEFT HEART CATH AND CORONARY/GRAFT ANGIOGRAPHY;  Surgeon: Wellington Hampshire, MD;  Location: [facility] CV LAB;  Service: Cardiovascular;  Laterality: N/A;   SUBCLAVIAN ARTERY STENT  05/2012   "2 stents" (12/26/2012)   TOOTH EXTRACTION  Spring 2017   mulitple (6)   VENA CAVA FILTER PLACEMENT  07/2012   Removable     Social History:   reports that he quit smoking about 11 years ago. His smoking use included cigarettes. He has a 50.00 pack-year smoking history. He has never used smokeless tobacco. He reports that he does not drink alcohol and does not use drugs.   Family History:  His family history includes Alcohol abuse in his father; Cirrhosis in his father; Hypothyroidism in his sister.    Allergies Allergies  Allergen Reactions   Plavix [Clopidogrel Bisulfate]     Brain hemorrhage prev while on plavix and aspirin     Home Medications  Prior to Admission medications   Medication Sig Start Date End Date Taking? Authorizing Provider  amLODipine (NORVASC) 5 MG tablet TAKE 1 TABLET BY MOUTH EVERY DAY Patient taking differently: Take 5 mg by mouth daily. 01/12/22  Yes Wellington Hampshire, MD  atorvastatin (LIPITOR) 20 MG tablet TAKE 1 TABLET BY MOUTH EVERY DAY Patient taking differently: Take 20 mg by mouth daily. 12/24/21  Yes Wellington Hampshire, MD  carvedilol (COREG) 6.25 MG tablet TAKE 1 TABLET BY MOUTH TWICE A DAY WITH MEALS Patient taking differently: Take 6.25 mg by mouth 2 (two) times daily with a meal. 08/03/21  Yes Copland, Frederico Hamman, MD  cilostazol (PLETAL) 50 MG tablet TAKE 1 TABLET BY MOUTH TWICE A DAY Patient taking differently: Take 50 mg by mouth 2 (two) times daily. 08/03/21  Yes Wellington Hampshire, MD  furosemide (LASIX) 40 MG tablet Take 0.5 tablets (20 mg total) by mouth as directed. Take 20 mg once daily with extra 20 mg as needed for weight gain of 3 pounds overnight or 5 pounds in one week 01/19/22  Yes Theora Gianotti, NP  levothyroxine (SYNTHROID) 150 MCG tablet [place] BREAKFAST Patient taking differently: Take 150 mcg by mouth daily before breakfast. TAKE ONE TABLET BY MOUTH EVERY DAY BEFORE BREAKFAST 08/03/21  Yes Copland, Frederico Hamman, MD  mupirocin ointment (BACTROBAN) 2 % Apply 1 Application topically daily as needed (rash). 10/20/21  Yes [provider]  triamcinolone ointment (KENALOG) 0.1 % APPLY TOPICALLY TO AFFECTED AREAS TWICE A DAY FOR 14 DAYS THEN MAY APPLY 5 DAYS WEEKLY TO AFFECTED AREAS Patient taking differently: Apply 1 Application topically daily as needed (rash). 12/21/21  Yes Ralene Bathe, MD  warfarin (COUMADIN) 5 MG tablet Take 0.5-1 tablets (2.5-5 mg total) by mouth See admin instructions.  TAKE 1/2 TABLET DAILY BY MOUTH EXCEPT TAKE 1 TABLET ON MON, WED, FRI OR AS DIRECTED ANTICOAGULATION CLINIC 03/12/21  Yes Copland, Frederico Hamman, MD     Critical care time: NA   Erick Colace ACNP-BC [facility] Pager # [phone] OR # [phone] if no answer

## 2022-02-09 NOTE — Progress Notes (Signed)
  Echocardiogram 2D Echocardiogram has been performed.  [name] 02/09/2022, 2:03 PM

## 2022-02-09 NOTE — Progress Notes (Addendum)
[facility] PROGRESS NOTE    Progress Note  [name]  WUX:[id number] DOB: 06-12-1940 DOA: 02/06/2022 PCP: Owens Loffler, MD     Brief Narrative:   [name] is an 81 y.o. male past medical history of CAD status post CABG, ischemic cardiomyopathy, chronic kidney disease stage IIIa with a baseline creatinine 1.5, paroxysmal atrial fibrillation on chronic Coumadin comes into the hospital after a fall found to have right fracture, pneumothorax and subcutaneous emphysema.  He initially presented to [facility] but was transferred to [facility] to the trauma service.  Did not had a syncopal event chest pain or shortness of breath.   Assessment/Plan:   Pneumothorax with subcutaneous emphysema/acute respiratory failure with hypoxia secondary to a fall in the setting of rib fractures: Trauma was consulted as well as IR, chest tube was placed. Repeated chest x-ray today showed resolution of pneumothorax, chest tube placed to waterseal. Repeat chest x-ray in the morning.  Acute respiratory failure with hypoxia: Was using 4 L, now on 15 L, satting greater 90%. Check CBC to follow-up on hemoglobin he does have multiple rib fractures on Coumadin. Moderate risk for DVT as his INR is therapeutic. He did denies any coughing when eating or drinking. We will consult PCCM.  A-fib with RVR: Likely due to stress, was placed on Cardizem overnight, and continued on Coreg. His heart rate is controlled now less than 100.  Nondisplaced right rib fifth fracture posterior along with seventh and eighth: Currently on lidocaine patch, incentive spirometry Robaxin. He is also on oxycodone with pain not well controlled.  Abnormal aortic aneurysm: CT scan of the abdomen and pelvis done that showed his widest diameter of 4.5 cm. We will need a follow-up imaging in 6 months with vascular as an outpatient.  Coronary artery disease: With history of CABG in 2013. Positive cath in 2022 that showed  patent grafts. Denies any type of chest pain. Continue Coreg and Lipitor.  Ischemic cardiomyopathy: With a 2D echo in November 2022 with an EF of 25%, grade 2 diastolic heart failure severe aortic stenosis.  Peripheral arterial disease: ABI last year showed left lower extremity duplex showing significant bilateral SFA disease with collateral flow bilaterally. Scheduled for repeat in December 2023. Continue Lipitor by following Coumadin.  Severe aortic stenosis: By echo, diagnostic cath showed moderate aortic stenosis with a gradient of 15 and a valve area 1.1 cm.  DVT: Continue Coumadin per pharmacy okayed by trauma.  Essential hypertension: Currently on Norvasc and Coreg.  Blood pressure is borderline. We will discontinue Norvasc. As he has been started on diltiazem.  Acute kidney injury on chronic kidney disease stage IIIa: With a baseline creatinine 1.3-1.5. On admission 1.9 he was started on IV fluid hydration  Hyperlipidemia: Due to Lipitor.  COPD: No signs of exacerbation.  Hypothyroidism: Continue Synthroid.   DVT prophylaxis: coumadin Family Communication:none Status is: Inpatient Remains inpatient appropriate because: Traumatic fall leading to rib fracture and pneumothorax.    Code Status:     Code Status Orders  (From admission, onward)           Start     Ordered   02/06/22 1635  Full code  Continuous        02/06/22 1635           Code Status History     Date Active Date Inactive Code Status Order ID Comments User Context   05/12/2021 0959 05/12/2021 1817 Full Code [id number]  Wellington Hampshire, MD Inpatient  03/15/2021 1149 03/15/2021 1912 Full Code [id number]  Wellington Hampshire, MD Inpatient   12/29/2016 1742 12/31/2016 1935 Full Code [id number]  Hillary Bow, MD ED   12/24/2016 0500 12/25/2016 1419 Full Code [id number]  Saundra Shelling, MD Inpatient   07/10/2013 1029 07/11/2013 1240 Full Code [id number]  Wellington Hampshire, MD Inpatient    05/24/2013 1840 05/29/2013 1619 Full Code [id number]  Eugenie Filler, MD Inpatient   07/11/2011 1321 07/22/2011 1808 Full Code [id number]  Grace Isaac, MD Inpatient         IV Access:   Peripheral IV   Procedures and diagnostic studies:   DG CHEST PORT 1 VIEW  Result Date: 02/08/2022 CLINICAL DATA:  Right-sided pneumothorax EXAM: PORTABLE CHEST 1 VIEW COMPARISON:  Chest x-ray dated February 07, 2022 FINDINGS: Unchanged position of right-sided chest tube. Cardiac and mediastinal contours are unchanged post median sternotomy and CABG. Bibasilar atelectasis. Unchanged mild pneumomediastinum and diffuse soft tissue emphysema. No visible pneumothorax. No large pleural effusion. IMPRESSION: 1. Unchanged position of right-sided chest tube. No visible pneumothorax. 2. Unchanged mild pneumomediastinum and diffuse soft tissue emphysema. Electronically Signed   By: Yetta Glassman M.D.   On: 02/08/2022 08:20   DG CHEST PORT 1 VIEW  Result Date: 02/07/2022 CLINICAL DATA:  Pneumothorax tracks. EXAM: PORTABLE CHEST 1 VIEW COMPARISON:  AP chest 02/07/2022 at 8:07 a.m., AP chest 02/06/2022 (multiple studies) FINDINGS: Single frontal view of the chest 02/07/2022 at 6:08 p.m. Small bore right pigtail catheter again projects over the inferior right lung. Within the limitations of high-grade subcutaneous air throughout the bilateral chest walls and neck, no definitive right pneumothorax is visualized, however it is difficult to exclude a tiny right apical pneumothorax. These findings appear not significantly changed from 02/07/2022 07 a.m. earlier today. Status post median sternotomy and CABG. Vascular stent overlies the proximal left subclavian artery. Mild high-grade calcification within the aortic arch. Cardiac silhouette is mildly to moderately enlarged. Mildly decreased lung volumes. Likely bibasilar bronchovascular crowding, similar to prior. No acute skeletal abnormality. IMPRESSION: 1. No significant  change from 02/07/2022 at 8:07 AM. 2. Small bore right pigtail catheter remains in place. No definitive right pneumothorax is visualized. 3. Similar bibasilar atelectasis. Electronically Signed   By: Yvonne Kendall M.D.   On: 02/07/2022 18:58   DG Chest Port 1 View  Result Date: 02/07/2022 CLINICAL DATA:  81 year old male with history of pneumothorax. Chest tube. EXAM: PORTABLE CHEST 1 VIEW COMPARISON:  Chest x-ray 02/06/2022. FINDINGS: Small bore chest tube with pigtail reformed projecting over the lower right hemithorax. Previously noted right-sided pneumothorax is no longer readily apparent. Lung volumes are low. Bibasilar opacities favored to reflect areas of subsegmental atelectasis. No consolidative airspace disease. No definite pleural effusions. No evidence of pulmonary edema. Heart size is normal. Upper mediastinal contours are within normal limits. Atherosclerotic calcifications are noted in the thoracic aorta. Status post median sternotomy for CABG. Extensive subcutaneous and intramuscular gas noted throughout the thorax bilaterally tracking cephalad into the cervical regions bilaterally. IMPRESSION: 1. Resolution of previously noted right-sided pneumothorax following right-sided chest tube placement. 2. Low lung volumes with bibasilar subsegmental atelectasis. 3. Aortic atherosclerosis. Electronically Signed   By: Vinnie Langton M.D.   On: 02/07/2022 08:15     Medical Consultants:   None.   Subjective:    [name] relates his pain is controlled not having any difficulty breathing.  Objective:    Vitals:   02/09/22 0030 02/09/22 0200 02/09/22 0300 02/09/22 0330  BP: Marland Kitchen)  92/57 102/61 (!) 91/56 90/61  Pulse: (!) 117 (!) 102 99 (!) 105  Resp: (!) '26 14 15 14  '$ Temp:  98 F (36.7 C) 98 F (36.7 C)   TempSrc:  Oral Oral   SpO2: 95% 94% 95% 95%  Weight:      Height:       SpO2: 95 % O2 Flow Rate (L/min): 15 L/min FiO2 (%): 36 %   Intake/Output Summary (Last 24 hours)  at 02/09/2022 0727 Last data filed at 02/09/2022 2671 Gross per 24 hour  Intake 20 ml  Output 760 ml  Net -740 ml   Filed Weights   02/06/22 1508  Weight: 73.9 kg    Exam: General exam: In no acute distress, palpable his emphysema Respiratory system: Good air movement, diffuse crepitation likely due to subcutaneous emphysema. Cardiovascular system: S1 & S2 heard, RRR. No JVD. Gastrointestinal system: Abdomen is nondistended, soft and nontender.  Extremities: Edema Skin: No rashes, lesions or ulcers Psychiatry: Judgement and insight appear normal. Mood & affect appropriate.    Data Reviewed:    Labs: Basic Metabolic Panel: Recent Labs  Lab 02/06/22 1259 02/07/22 0320 02/08/22 0024  NA 138 140 139  K 4.6 4.6 4.7  CL 105 106 106  CO2 '26 26 26  '$ GLUCOSE 107* 98 103*  BUN 25* 21 28*  CREATININE 1.51* 1.52* 1.90*  CALCIUM 8.8* 9.0 8.7*  MG  --   --  2.4  PHOS  --   --  4.4   GFR Estimated Creatinine Clearance: 31 mL/min (A) (by C-G formula based on SCr of 1.9 mg/dL (H)). Liver Function Tests: Recent Labs  Lab 02/06/22 1259 02/08/22 0024  AST 18 26  ALT 12 15  ALKPHOS 94 76  BILITOT 1.0 1.3*  PROT 7.2 6.1*  ALBUMIN 4.0 3.3*   Recent Labs  Lab 02/06/22 1259  LIPASE 55*   No results for input(s): "AMMONIA" in the last 168 hours. Coagulation profile Recent Labs  Lab 02/06/22 1259 02/07/22 0320 02/08/22 0024 02/09/22 0024  INR 2.0* 2.2* 2.7* 2.8*   COVID-19 Labs  No results for input(s): "DDIMER", "FERRITIN", "LDH", "CRP" in the last 72 hours.  Lab Results  Component Value Date   SARSCOV2NAA NEGATIVE 11/09/2021   [place] NEGATIVE 02/12/2021    CBC: Recent Labs  Lab 02/06/22 1259 02/07/22 0320 02/08/22 0024  WBC 10.3 7.6 8.0  NEUTROABS 7.9*  --   --   HGB 14.6 13.7 14.5  HCT 47.2 44.3 44.8  MCV 90.6 91.7 90.3  PLT 237 217 206   Cardiac Enzymes: No results for input(s): "CKTOTAL", "CKMB", "CKMBINDEX", "TROPONINI" in the last 168  hours. BNP (last 3 results) No results for input(s): "PROBNP" in the last 8760 hours. CBG: No results for input(s): "GLUCAP" in the last 168 hours. D-Dimer: No results for input(s): "DDIMER" in the last 72 hours. Hgb A1c: No results for input(s): "HGBA1C" in the last 72 hours. Lipid Profile: No results for input(s): "CHOL", "HDL", "LDLCALC", "TRIG", "CHOLHDL", "LDLDIRECT" in the last 72 hours. Thyroid function studies: No results for input(s): "TSH", "T4TOTAL", "T3FREE", "THYROIDAB" in the last 72 hours.  Invalid input(s): "FREET3" Anemia work up: No results for input(s): "VITAMINB12", "FOLATE", "FERRITIN", "TIBC", "IRON", "RETICCTPCT" in the last 72 hours. Sepsis Labs: Recent Labs  Lab 02/06/22 1259 02/07/22 0320 02/08/22 0024  WBC 10.3 7.6 8.0   Microbiology No results found for this or any previous visit (from the past 240 hour(s)).   Medications:    atorvastatin  20 mg Oral Daily   carvedilol  6.25 mg Oral BID WC   cilostazol  50 mg Oral BID   dextromethorphan-guaiFENesin  1 tablet Oral BID   levothyroxine  150 mcg Oral QAC breakfast   lidocaine  1 patch Transdermal Q24H   sodium chloride flush  10 mL Intrapleural Q8H   Warfarin - Pharmacist Dosing Inpatient   Does not apply q1600   Continuous Infusions:  diltiazem (CARDIZEM) infusion 5 mg/hr (02/08/22 2119)   methocarbamol (ROBAXIN) IV        LOS: 2 days   Charlynne Cousins  [facility]  02/09/2022, 7:27 AM

## 2022-02-09 NOTE — Progress Notes (Signed)
   02/09/22 0758  Assess: MEWS Score  Temp 98 F (36.7 C)  BP (!) 91/59  MAP (mmHg) 69  Pulse Rate 97  ECG Heart Rate (!) 103  Resp 19  Level of Consciousness Alert  SpO2 94 %  Assess: MEWS Score  MEWS Temp 0  MEWS Systolic 1  MEWS Pulse 1  MEWS RR 0  MEWS LOC 0  MEWS Score 2  MEWS Score Color Yellow  Assess: if the MEWS score is Yellow or Red  Were vital signs taken at a resting state? Yes  Focused Assessment No change from prior assessment  Does the patient meet 2 or more of the SIRS criteria? Yes  Does the patient have a confirmed or suspected source of infection? No  MEWS guidelines implemented *See Row Information* No, previously yellow, continue vital signs every 4 hours  Treat  MEWS Interventions Escalated (See documentation below)  Pain Scale 0-10  Pain Score 0  Take Vital Signs  Increase Vital Sign Frequency  Yellow: Q 2hr X 2 then Q 4hr X 2, if remains yellow, continue Q 4hrs  Escalate  MEWS: Escalate Yellow: discuss with charge nurse/RN and consider discussing with provider and RRT  Notify: Charge Nurse/RN  Name of Charge Nurse/RN Notified Chat, RN  Date Charge Nurse/RN Notified 02/09/22  Time Charge Nurse/RN Notified 0800  Notify: Provider  Provider Name/Title Dr. Olevia Bowens  Date Provider Notified 02/09/22  Time Provider Notified 0830  Method of Notification Page  Notification Reason Change in status  Provider response En route  Date of Provider Response 02/09/22  Time of Provider Response [phone]  Document  Patient Outcome Stabilized after interventions  Progress note created (see row info) Yes  Assess: SIRS CRITERIA  SIRS Temperature  0  SIRS Pulse 1  SIRS Respirations  0  SIRS WBC 1  SIRS Score Sum  2   Pt cardizem stopped and beta blocker held this morning. MD at bedside

## 2022-02-10 ENCOUNTER — Inpatient Hospital Stay (HOSPITAL_COMMUNITY): Payer: Medicare Other

## 2022-02-10 DIAGNOSIS — S27321D Contusion of lung, unilateral, subsequent encounter: Secondary | ICD-10-CM

## 2022-02-10 DIAGNOSIS — N1831 Chronic kidney disease, stage 3a: Secondary | ICD-10-CM | POA: Diagnosis not present

## 2022-02-10 DIAGNOSIS — S27321A Contusion of lung, unilateral, initial encounter: Secondary | ICD-10-CM

## 2022-02-10 DIAGNOSIS — J9601 Acute respiratory failure with hypoxia: Secondary | ICD-10-CM | POA: Diagnosis not present

## 2022-02-10 DIAGNOSIS — J449 Chronic obstructive pulmonary disease, unspecified: Secondary | ICD-10-CM

## 2022-02-10 DIAGNOSIS — I10 Essential (primary) hypertension: Secondary | ICD-10-CM | POA: Diagnosis not present

## 2022-02-10 DIAGNOSIS — J431 Panlobular emphysema: Secondary | ICD-10-CM | POA: Diagnosis not present

## 2022-02-10 DIAGNOSIS — S270XXA Traumatic pneumothorax, initial encounter: Secondary | ICD-10-CM | POA: Diagnosis not present

## 2022-02-10 LAB — BASIC METABOLIC PANEL
Anion gap: 7 (ref 5–15)
BUN: 42 mg/dL — ABNORMAL HIGH (ref 8–23)
CO2: 24 mmol/L (ref 22–32)
Calcium: 8.8 mg/dL — ABNORMAL LOW (ref 8.9–10.3)
Chloride: 106 mmol/L (ref 98–111)
Creatinine, Ser: 1.73 mg/dL — ABNORMAL HIGH (ref 0.61–1.24)
GFR, Estimated: 39 mL/min — ABNORMAL LOW (ref >60)
Glucose, Bld: 102 mg/dL — ABNORMAL HIGH (ref 70–99)
Potassium: 4.6 mmol/L (ref 3.5–5.1)
Sodium: 137 mmol/L (ref 135–145)

## 2022-02-10 LAB — PROCALCITONIN: Procalcitonin: 0.13 ng/mL

## 2022-02-10 LAB — PROTIME-INR
INR: 3.2 — ABNORMAL HIGH (ref 0.8–1.2)
Prothrombin Time: 32.2 seconds — ABNORMAL HIGH (ref 11.4–15.2)

## 2022-02-10 MED ORDER — IPRATROPIUM-ALBUTEROL 0.5-2.5 (3) MG/3ML IN SOLN: 3.0000 mL | RESPIRATORY_TRACT | Status: DC | PRN

## 2022-02-10 MED ORDER — IPRATROPIUM-ALBUTEROL 0.5-2.5 (3) MG/3ML IN SOLN
3.0000 mL | Freq: Two times a day (BID) | RESPIRATORY_TRACT | Status: DC
  Administered 2022-02-10 – 2022-02-12 (×4): 3 mL via RESPIRATORY_TRACT
  Filled 2022-02-10 (×4): qty 3

## 2022-02-10 MED ORDER — WARFARIN SODIUM 1 MG PO TABS
1.0000 mg | ORAL_TABLET | Freq: Once | ORAL | Status: AC
  Administered 2022-02-10: 1 mg via ORAL
  Filled 2022-02-10: qty 1

## 2022-02-10 NOTE — Progress Notes (Addendum)
Subjective: Feeling better this morning than yesterday.  Eating some of his breakfast.  No issues with chest tube on waterseal for last 24 hrs.  Objective: Vital signs in last 24 hours: Temp:  [97.5 F (36.4 C)-98.1 F (36.7 C)] 97.5 F (36.4 C) (10/19 0728) Pulse Rate:  [78-117] 82 (10/19 0728) Resp:  [13-22] 17 (10/19 0728) BP: (82-115)/(44-80) 115/45 (10/19 0728) SpO2:  [90 %-99 %] 96 % (10/19 0728) FiO2 (%):  [100 %] 100 % (10/19 0300) Last BM Date : 02/05/22  Intake/Output from previous day: 10/18 0701 - 10/19 0700 In: -  Out: 50 [Drains:40] Intake/Output this shift: Total I/O In: -  Out: 500 [Urine:500]  PE: Gen: NAD HEENT: facial edema is much improved.  PERRL Heart: regular Lungs: moving good air.  Less wheezy today than yesterday.  No air leak.  CT with 40cc of ss output overnight.  CT removed with no issues and occlusive dressing placed. Psych: A&Ox3    Lab Results:  Recent Labs    02/08/22 0024 02/09/22 0024  WBC 8.0 8.5  HGB 14.5 13.1  HCT 44.8 40.6  PLT 206 193   BMET Recent Labs    02/09/22 0024 02/10/22 0025  NA 140 137  K 4.3 4.6  CL 107 106  CO2 23 24  GLUCOSE 108* 102*  BUN 36* 42*  CREATININE 1.77* 1.73*  CALCIUM 8.9 8.8*   PT/INR Recent Labs    02/09/22 0024 02/10/22 0025  LABPROT 29.6* 32.2*  INR 2.8* 3.2*   CMP     Component Value Date/Time   NA 137 02/10/2022 0025   NA 142 05/06/2021 0953   NA 136 06/03/2013 0454   K 4.6 02/10/2022 0025   K 4.1 06/03/2013 0454   CL 106 02/10/2022 0025   CL 106 06/03/2013 0454   CO2 24 02/10/2022 0025   CO2 27 06/03/2013 0454   GLUCOSE 102 (H) 02/10/2022 0025   GLUCOSE 80 06/03/2013 0454   BUN 42 (H) 02/10/2022 0025   BUN 22 05/06/2021 0953   BUN 22 (H) 06/03/2013 0454   CREATININE 1.73 (H) 02/10/2022 0025   CREATININE 1.59 (H) 06/03/2013 0454   CREATININE 1.02 04/22/2011 1507   CALCIUM 8.8 (L) 02/10/2022 0025   CALCIUM 8.8 06/03/2013 0454   PROT 6.1 (L) 02/08/2022  0024   PROT 6.5 01/31/2014 0756   PROT 6.7 06/03/2013 0454   ALBUMIN 3.3 (L) 02/08/2022 0024   ALBUMIN 4.2 01/31/2014 0756   ALBUMIN 2.1 (L) 06/03/2013 0454   AST 26 02/08/2022 0024   AST 34 06/03/2013 0454   ALT 15 02/08/2022 0024   ALT 52 06/03/2013 0454   ALKPHOS 76 02/08/2022 0024   ALKPHOS 97 06/03/2013 0454   BILITOT 1.3 (H) 02/08/2022 0024   BILITOT 0.6 06/03/2013 0454   GFRNONAA 39 (L) 02/10/2022 0025   GFRNONAA 43 (L) 06/03/2013 0454   GFRAA 59 (L) 12/30/2016 0103   GFRAA 50 (L) 06/03/2013 0454   Lipase     Component Value Date/Time   LIPASE 55 (H) 02/06/2022 1259       Studies/Results: CT CHEST WO CONTRAST  Result Date: 02/10/2022 CLINICAL DATA:  Respiratory illness with nondiagnostic x-ray EXAM: CT CHEST WITHOUT CONTRAST TECHNIQUE: Multidetector CT imaging of the chest was performed following the standard protocol without IV contrast. RADIATION DOSE REDUCTION: This exam was performed according to the departmental dose-optimization program which includes automated exposure control, adjustment of the mA and/or kV according to patient size  and/or use of iterative reconstruction technique. COMPARISON:  Four days ago FINDINGS: Cardiovascular: Normal heart size. Extensive atheromatous calcification of the aorta and coronaries. Prior CABG. Mediastinum/Nodes: Pneumomediastinum which is moderate and new. No adenopathy or mass Lungs/Pleura: Right lateral and lower chest tube. Resolved pneumothorax. Opacity with volume loss and consolidative features in the right lower lobe with small volume effusion. Centrilobular emphysema. Upper Abdomen: No acute finding or visible injury. Partially covered right renal cysts. Cholecystectomy. Extensive atherosclerosis. Musculoskeletal: Progressive chest wall emphysema which is diffuse. Known anterior right fifth rib fracture. Known right posterior 6 through 8 rib fractures. Extensive spondylosis with multi-level bridging. IMPRESSION: 1. New opacity  in the right lower lobe, likely a combination of atelectasis and pneumonia. 2. Resolved right pneumothorax after chest tube placement. There is extensive and progressed chest wall emphysema with pneumomediastinum. 3. Aortic Atherosclerosis (ICD10-I70.0) and Emphysema (ICD10-J43.9). Electronically Signed   By: Jorje Guild M.D.   On: 02/10/2022 07:38   ECHOCARDIOGRAM COMPLETE  Result Date: 02/09/2022    ECHOCARDIOGRAM REPORT   Patient Name:   [name] ZOEY [name] Date of Exam: 02/09/2022 Medical Rec #:  [id number]      Height:       69.0 in Accession #:    [id number]     Weight:       163.0 lb Date of Birth:  [birth year]       BSA:          1.894 m Patient Age:    81 years       BP:           91/51 mmHg Patient Gender: M              HR:           101 bpm. Exam Location:  Inpatient Procedure: 2D Echo, Cardiac Doppler and Color Doppler Indications:    R94.31 Abnormal EKG  History:        Patient has prior history of Echocardiogram examinations, most                 recent 02/26/2021. CAD, Abnormal ECG and Prior CABG, Pulmonary                 HTN, Arrythmias:Atrial Fibrillation, Signs/Symptoms:Edema; Risk                 Factors:Hypertension and Current Smoker. Pneumothorax.  Sonographer:    Roseanna Rainbow RDCS Referring Phys: [place]  Sonographer Comments: Technically difficult study due to poor echo windows, suboptimal parasternal window and no subcostal window. Difficult study, patient supine. Pleural drain in place and paitent in pain. IMPRESSIONS  1. Left ventricular ejection fraction, by estimation, is 55 to 60%. The left ventricle has normal function. The left ventricle has no regional wall motion abnormalities. Left ventricular diastolic function could not be evaluated.  2. Right ventricular systolic function is normal. The right ventricular size is normal. There is mildly elevated pulmonary artery systolic pressure. The estimated right ventricular systolic pressure is 66.4 mmHg.  3. Left atrial size  was mildly dilated.  4. Right atrial size was mildly dilated.  5. The mitral valve is normal in structure. Mild mitral valve regurgitation. No evidence of mitral stenosis.  6. The aortic valve is tricuspid. There is severe calcifcation of the aortic valve. Aortic valve regurgitation is not visualized. Mild to moderate aortic valve stenosis. Aortic valve mean gradient measures 16.5 mmHg. Aortic valve Vmax measures 2.66 m/s.  7. The inferior vena cava  is normal in size with greater than 50% respiratory variability, suggesting right atrial pressure of 3 mmHg. FINDINGS  Left Ventricle: Left ventricular ejection fraction, by estimation, is 55 to 60%. The left ventricle has normal function. The left ventricle has no regional wall motion abnormalities. The left ventricular internal cavity size was normal in size. There is  no left ventricular hypertrophy. Left ventricular diastolic function could not be evaluated due to atrial fibrillation. Left ventricular diastolic function could not be evaluated. Right Ventricle: The right ventricular size is normal. No increase in right ventricular wall thickness. Right ventricular systolic function is normal. There is mildly elevated pulmonary artery systolic pressure. The tricuspid regurgitant velocity is 2.57  m/s, and with an assumed right atrial pressure of 8 mmHg, the estimated right ventricular systolic pressure is 93.7 mmHg. Left Atrium: Left atrial size was mildly dilated. Right Atrium: Right atrial size was mildly dilated. Pericardium: There is no evidence of pericardial effusion. Mitral Valve: The mitral valve is normal in structure. Mild mitral valve regurgitation. No evidence of mitral valve stenosis. Tricuspid Valve: The tricuspid valve is normal in structure. Tricuspid valve regurgitation is mild . No evidence of tricuspid stenosis. Aortic Valve: The aortic valve is tricuspid. There is severe calcifcation of the aortic valve. Aortic valve regurgitation is not visualized.  Mild to moderate aortic stenosis is present. Aortic valve mean gradient measures 16.5 mmHg. Aortic valve peak gradient measures 28.4 mmHg. Aortic valve area, by VTI measures 1.24 cm. Pulmonic Valve: The pulmonic valve was normal in structure. Pulmonic valve regurgitation is not visualized. No evidence of pulmonic stenosis. Aorta: The aortic root is normal in size and structure. Venous: The inferior vena cava is normal in size with greater than 50% respiratory variability, suggesting right atrial pressure of 3 mmHg. IAS/Shunts: No atrial level shunt detected by color flow Doppler.  LEFT VENTRICLE PLAX 2D LVIDd:         5.00 cm LVIDs:         4.00 cm LV PW:         1.30 cm LV IVS:        1.00 cm LVOT diam:     2.20 cm LV SV:         63 LV SV Index:   34 LVOT Area:     3.80 cm  LV Volumes (MOD) LV vol d, MOD A2C: 85.9 ml LV vol d, MOD A4C: 77.0 ml LV vol s, MOD A2C: 37.3 ml LV vol s, MOD A4C: 38.7 ml LV SV MOD A2C:     48.6 ml LV SV MOD A4C:     77.0 ml LV SV MOD BP:      43.7 ml RIGHT VENTRICLE RV S prime:     6.09 cm/s TAPSE (M-mode): 0.7 cm LEFT ATRIUM             Index        RIGHT ATRIUM           Index LA diam:        3.90 cm 2.06 cm/m   RA Area:     17.40 cm LA Vol (A2C):   45.5 ml 24.02 ml/m  RA Volume:   47.60 ml  25.13 ml/m LA Vol (A4C):   40.5 ml 21.38 ml/m LA Biplane Vol: 43.6 ml 23.02 ml/m  AORTIC VALVE AV Area (Vmax):    1.27 cm AV Area (Vmean):   1.19 cm AV Area (VTI):     1.24 cm AV Vmax:  266.50 cm/s AV Vmean:          191.500 cm/s AV VTI:            0.513 m AV Peak Grad:      28.4 mmHg AV Mean Grad:      16.5 mmHg LVOT Vmax:         89.10 cm/s LVOT Vmean:        59.700 cm/s LVOT VTI:          0.167 m LVOT/AV VTI ratio: 0.33  AORTA Ao Root diam: 3.00 cm MITRAL VALVE                TRICUSPID VALVE MV Area (PHT): 4.58 cm     TR Peak grad:   26.4 mmHg MV Decel Time: 166 msec     TR Vmax:        257.00 cm/s MV E velocity: 114.50 cm/s                             SHUNTS                              Systemic VTI:  0.17 m                             Systemic Diam: 2.20 cm Glori Bickers MD Electronically signed by Glori Bickers MD Signature Date/Time: 02/09/2022/4:11:44 PM    Final    DG CHEST PORT 1 VIEW  Result Date: 02/09/2022 CLINICAL DATA:  Pneumothorax EXAM: PORTABLE CHEST 1 VIEW COMPARISON:  Chest x-ray dated February 08, 2022 FINDINGS: Cardiac and mediastinal contours are unchanged post median sternotomy and CABG. Right-sided chest tube in place. Visible pneumothorax. Slightly decreased pneumomediastinum. Similar diffuse subcutaneous air. No large pleural effusion. IMPRESSION: No visible pneumothorax.  Right-sided chest tube in place. Electronically Signed   By: Yetta Glassman M.D.   On: 02/09/2022 08:25    Anti-infectives: Anti-infectives (From admission, onward)    None        Assessment/Plan Fall R rib fxs 5-8 - pulm toilet, pain control, IS R PTX/significant diffuse subq emphysema - s/p chest tube placement.  No air leak noted.  Lung still up after 24 hrs of waterseal.  CT DC today.  Repeat CXR in 4 hours.  CT chest and CXR from this am reviewed personally Acute hypoxic respiratory failure - per medicine A fib on anticoagulation - INR 2.7 this am, per medicine/pharmacy CAD PAC Ischemic cardiomyopathy AAA AV stenosis DVT - on coumadin HTN CKD HLD COPD Hypothyroidism --above per medicine--  FEN - regular VTE - coumadin ID - none currently needed  Dispo - if repeat CXR shows no recurrence of PTX, we will sign off.  I have sent follow up for a repeat CXR in 2 weeks and then we will call him with his results to follow up on his PTX.  This is all in his instruction section of his chart.  D/w primary service.  I reviewed Consultant CCM notes, hospitalist notes, last 24 h vitals and pain scores, last 48 h intake and output, last 24 h labs and trends, and last 24 h imaging results.   LOS: 3 days    Henreitta Cea , [facility]  Surgery 02/10/2022, 8:15 AM Please see Amion for pager number during day hours 7:00am-4:30pm or 7:00am -11:30am  on weekends

## 2022-02-10 NOTE — Progress Notes (Signed)
Occupational Therapy Treatment Patient Details Name: [name] MRN: [id number] DOB: 1941/02/15 Today's Date: 02/10/2022   History of present illness 81 year old male who presented after a fall. Found to have R rib fxs 5-8 and pneumothorax with chest tube placement. PMHx: CAD status post CABG, ischemic cardiomyopathy, HTN, HLD, hypothyroidism, CKD 3A with baseline creatinine around 1.5, PAF on chronic Coumadin   OT comments  Pt making good progress toward all adls and all goals. Pt ambulating in room to bathroom and sink on O2 with min guard assist and assist for lines. Pt was up on feet standing/walking for 10 minutes without a sitting rest break. Pt grooming in standing but continues to be limited with LE dressing due to pain when bending legs up toward body to dress.  Wife there to see all pt could do on his feet this session.      Recommendations for follow up therapy are one component of a multi-disciplinary discharge planning process, led by the attending physician.  Recommendations may be updated based on patient status, additional functional criteria and insurance authorization.    Follow Up Recommendations  Home health OT    Assistance Recommended at Discharge Intermittent Supervision/Assistance  Patient can return home with the following  A little help with walking and/or transfers;A lot of help with bathing/dressing/bathroom;Assistance with cooking/housework;Direct supervision/assist for financial management;Direct supervision/assist for medications management;Assist for transportation;Help with stairs or ramp for entrance   Equipment Recommendations  None recommended by OT    Recommendations for Other Services      Precautions / Restrictions Precautions Precautions: Fall Restrictions Weight Bearing Restrictions: No       Mobility Bed Mobility Overal bed mobility: Needs Assistance Bed Mobility: Sit to Supine     Supine to sit: Min assist, HOB elevated Sit to  supine: Min assist, HOB elevated   General bed mobility comments: assist to lift legs back into bed due to pain    Transfers Overall transfer level: Needs assistance Equipment used: Rolling walker (2 wheels) Transfers: Sit to/from Stand, Bed to chair/wheelchair/BSC Sit to Stand: Min guard     Step pivot transfers: Min guard     General transfer comment: Cues for hand placement. No physical assist needed     Balance Overall balance assessment: Needs assistance Sitting-balance support: Feet supported, No upper extremity supported Sitting balance-Leahy Scale: Good     Standing balance support: Bilateral upper extremity supported, Reliant on assistive device for balance Standing balance-Leahy Scale: Fair Standing balance comment: needs external support but was able to let go of walker with both hands to adjust clothing in standing                           ADL either performed or assessed with clinical judgement   ADL Overall ADL's : Needs assistance/impaired Eating/Feeding: Independent;Sitting   Grooming: Wash/dry hands;Wash/dry face;Oral care;Standing;Supervision/safety               Lower Body Dressing: Moderate assistance;Sit to/from stand;Cueing for compensatory techniques Lower Body Dressing Details (indicate cue type and reason): worked on figure four position to donn socks and start pants over feet. Toilet Transfer: Min guard;Rolling walker (2 wheels);Ambulation;BSC/3in1;Comfort height toilet Toilet Transfer Details (indicate cue type and reason): Pt walked to bathroom with min guard assist.  With elevated commode, pt did not require phyiscal assist getting on and off the commode. Toileting- Clothing Manipulation and Hygiene: Moderate assistance;Sit to/from stand;Cueing for compensatory techniques Toileting - Clothing Manipulation  Details (indicate cue type and reason): hard to clean self due to pain     Functional mobility during ADLs: Min  guard;Rolling walker (2 wheels) General ADL Comments: Pt doing better with adls but limited most by pain    Extremity/Trunk Assessment Upper Extremity Assessment Upper Extremity Assessment: Overall WFL for tasks assessed RUE Deficits / Details: Pt using RUE more than on eval. RUE: Unable to fully assess due to pain RUE Sensation: WNL   Lower Extremity Assessment Lower Extremity Assessment: Defer to PT evaluation        Vision   Vision Assessment?: No apparent visual deficits Additional Comments: Pt wears glasses   Perception Perception Perception: Within Functional Limits   Praxis Praxis Praxis: Intact    Cognition Arousal/Alertness: Awake/alert Behavior During Therapy: WFL for tasks assessed/performed Overall Cognitive Status: Within Functional Limits for tasks assessed                                 General Comments: Pt appears to have cleared cognitively from admission.        Exercises      Shoulder Instructions       General Comments Pt most limited by pain today. First time walking in room and to bathroom. Pt tolerated well. Pt on full 10L O2 during session and stayed above 90%.    Pertinent Vitals/ Pain       Pain Assessment Pain Assessment: Faces Faces Pain Scale: Hurts even more Pain Location: R flank with mobility Pain Descriptors / Indicators: Grimacing, Discomfort Pain Intervention(s): Limited activity within patient's tolerance, Monitored during session, Repositioned  Home Living                                          Prior Functioning/Environment              Frequency  Min 2X/week        Progress Toward Goals  OT Goals(current goals can now be found in the care plan section)  Progress towards OT goals: Progressing toward goals  Acute Rehab OT Goals Patient Stated Goal: to get out of here OT Goal Formulation: With patient Time For Goal Achievement: 02/22/22 Potential to Achieve Goals:  Good ADL Goals Pt Will Perform Grooming: with modified independence;standing Pt Will Perform Upper Body Dressing: with modified independence;sitting Pt Will Perform Lower Body Dressing: with supervision;sit to/from stand Pt Will Transfer to Toilet: with supervision;ambulating  Plan Discharge plan remains appropriate    Co-evaluation                 AM-PAC OT "6 Clicks" Daily Activity     Outcome Measure   Help from another person eating meals?: None Help from another person taking care of personal grooming?: None Help from another person toileting, which includes using toliet, bedpan, or urinal?: A Little Help from another person bathing (including washing, rinsing, drying)?: A Little Help from another person to put on and taking off regular upper body clothing?: A Little Help from another person to put on and taking off regular lower body clothing?: A Lot 6 Click Score: 19    End of Session Equipment Utilized During Treatment: Oxygen  OT Visit Diagnosis: Unsteadiness on feet (R26.81);Other abnormalities of gait and mobility (R26.89);Muscle weakness (generalized) (M62.81);History of falling (Z91.81);Pain Pain - Right/Left: Right Pain - part  of body:  (ribs)   Activity Tolerance Patient limited by pain   Patient Left in bed;with call bell/phone within reach;with family/visitor present   Nurse Communication Mobility status        Time: 1712-7871 OT Time Calculation (min): 29 min  Charges: OT General Charges $OT Visit: 1 Visit OT Treatments $Self Care/Home Management : 23-37 mins    Glenford Peers 02/10/2022, 1:06 PM

## 2022-02-10 NOTE — Plan of Care (Signed)

## 2022-02-10 NOTE — Care Management Important Message (Signed)
Important Message  Patient Details  Name: [name] MRN: [id number] Date of Birth: [birth year]   Medicare Important Message Given:  Yes     Orbie Pyo 02/10/2022, 1:43 PM

## 2022-02-10 NOTE — Progress Notes (Signed)
Follow up CXR post chest tube shows no evidence of PTX.  Trauma service will sign off at this time.  Follow up instructions in AVS.  Henreitta Cea 3:00 PM 02/10/2022

## 2022-02-10 NOTE — TOC Progression Note (Signed)
Transition of Care [facility]) - Progression Note    Patient Details  Name: [name] MRN: [id number] Date of Birth: [birth year]  Transition of Care [facility]) CM/SW [place], RN Phone Number: 02/10/2022, 10:07 AM  Clinical Narrative:     Called patient wife to follow up on home health choice. Left confidential VM to call this RNCM back.  Expected Discharge Plan: [place] Barriers to Discharge: Continued Medical Work up  Expected Discharge Plan and Services Expected Discharge Plan: [place]   Discharge Planning Services: CM Consult   Living arrangements for the past 2 months: Single Family Home                                       Social Determinants of Health (SDOH) Interventions    Readmission Risk Interventions     No data to display

## 2022-02-10 NOTE — Progress Notes (Signed)
Mobility Specialist Progress Note    02/10/22 1040  Mobility  Activity Ambulated with assistance in room  Level of Assistance Minimal assist, patient does 75% or more  Assistive Device Front wheel walker  Distance Ambulated (ft) 5 ft  Activity Response Tolerated well  Mobility Referral Yes  $Mobility charge 1 Mobility   Pre-Mobility: 83 HR, 93% SpO2 During Mobility: 92 HR, 93% SpO2 Post-Mobility: 88 HR, 91% SpO2  Pt received in bed and agreeable. Passed flatus on BSC. Left in chair w/ call bell in reach.   Hildred Alamin Mobility Specialist  Secure Chat Only

## 2022-02-10 NOTE — Progress Notes (Signed)
NAME:  [name], MRN:  [id number], DOB:  Jul 17, 1940, LOS: 3 ADMISSION DATE:  02/06/2022, CONSULTATION DATE:  10/18 REFERRING MD:  Venetia Constable, CHIEF COMPLAINT:  progressive hypoxia    History of Present Illness:  81 year old male w/ hx as outlined below. Admitted 10/15 s/p fall where he sustained multiple right sided rib fxs and Right sided PTX w/ fairly sig associate [place] emphysema.  Admitted by IM. Trauma consulted. Imaging reviewed w/ thoracic surg who recommended IR placement of tube given small size and concern about lung not completely re-inflating d/t prior CABG and potential for trapped lung.  A 14 fr CT guided CT was placed by IR on 10/15. Post CT placement showed Resolution of right PT but fairly sig [place] emphysema. Over the following days CT has been left to sxn, serial CXRs cont to show diffuse [place] air, elevated Right HD and no recurrence of PTX. During this time he has had continuous increased FIO2 needs now up to 15 l HFO. His INR has remained therapeutic (he is still on coumadin). PCCM has been asked to evaluate in effort to explain and assist w/ his rising O2 needs.    Pertinent  Medical History  Elevated right HD since his CABG 2014.  CAD w/ prior CABG, ICM, last known EF 55% , CKD stage IIIa, PAF on chronic coumadin, emphysema  Significant Hospital Events: Including procedures, antibiotic start and stop dates in addition to other pertinent events   10/15 admitted after fall c/b rib fxs and right apical PTX w/ diffuse [place] emphysema. CT placed by IR  10/15-10/18 resolution of ptx w/ CT to sxn, pn-going [place] emphysema; rising O2 needs up to 15 liters.  New onset AF w/ RVR 10/17.   Interim History / Subjective:  NAD Up in chair eating States pain is better controlled today Remains on salter HFNC 10L  Objective   Blood pressure (!) 118/55, pulse 94, temperature (!) 97.5 F (36.4 C), temperature source Oral, resp. rate 13, height '5\' 9"'$  (1.753 m), weight 73.9 kg, SpO2 96 %.    FiO2  (%):  [100 %] 100 %   Intake/Output Summary (Last 24 hours) at 02/10/2022 1111 Last data filed at 02/10/2022 0730 Gross per 24 hour  Intake --  Output 540 ml  Net -540 ml    Filed Weights   02/06/22 1508  Weight: 73.9 kg    Examination: General:  NAD HEENT: MM pink/moist; [place] in place Neuro: Aox3; MAE CV: s1s2, no m/r/g PULM:  dim BS bilaterally; salter HFNC 10L GI: soft, bsx4 active  Extremities: warm/dry, BLE edema  Skin: no rashes or lesions appreciated  Resolved Hospital Problem list     Assessment & Plan:  Principal Problem:   Acute respiratory failure ([place]) Active Problems:   Hypothyroidism   HYPERCHOLESTEROLEMIA   Abdominal aortic aneurysm (HCC)   COPD (chronic obstructive pulmonary disease) (HCC)   CAD (coronary artery disease), native coronary artery   Ischemic cardiomyopathy   Peripheral arterial disease (HCC)   HTN (hypertension)   CKD (chronic kidney disease) stage 3, GFR 30-59 ml/min (HCC)   DVT (deep venous thrombosis) (HCC)   Aortic valve stenosis   Rib fractures   Pneumothorax   Elevated diaphragm   Atelectasis   Pulmonary edema   Subcutaneous emphysema (HCC)  Acute hypoxic respiratory failure.  Seemingly multifactorial.  I doubt this is pulmonary emboli as he has had therapeutic INR and has remained on warfarin.  This does not appear to be due  to acute exacerbation of his underlying COPD.  At this point highest on differential diagnosis would be pulmonary edema, atelectasis, and or pneumonia, pneumonia being less likely but on differential.  He has a chronically elevated right hemidiaphragm. His extensive [place] emphysema makes 1 view imaging very difficult  RLL opacity vs. atelectasis COPD P: -wean salter HFNC for sats >90% -BNP 350; consider lasix if oxygenation worsens or increase wob -PCT 0.13; trend wbc/fever curve; can likely hold on abx -Aggressive Pulm toiletry: IS, flutter, CPT; PT/OT -pain management per primary; currently controlled per  patient -duoneb scheduled and prn  Multiple rib fractures on the right with associated traumatic right pneumothorax status post chest tube placement 10/15, with radiographic resolution of pneumothorax but ongoing subcutaneous emphysema -I have flushed left thoracostomy tube to ensure patency. -There is no airleak -Radiographically of the chest tube seems to be in good position P: -chest tube removed per surgery; post CXR showing no pneumo  Best Practice (right click and "Reselect all SmartList Selections" daily)  Per primary   Labs   CBC: Recent Labs  Lab 02/06/22 1259 02/07/22 0320 02/08/22 0024 02/09/22 0024  WBC 10.3 7.6 8.0 8.5  NEUTROABS 7.9*  --   --  6.5  HGB 14.6 13.7 14.5 13.1  HCT 47.2 44.3 44.8 40.6  MCV 90.6 91.7 90.3 90.6  PLT 237 217 206 193     Basic Metabolic Panel: Recent Labs  Lab 02/06/22 1259 02/07/22 0320 02/08/22 0024 02/09/22 0024 02/10/22 0025  NA 138 140 139 140 137  K 4.6 4.6 4.7 4.3 4.6  CL 105 106 106 107 106  CO2 '26 26 26 23 24  '$ GLUCOSE 107* 98 103* 108* 102*  BUN 25* 21 28* 36* 42*  CREATININE 1.51* 1.52* 1.90* 1.77* 1.73*  CALCIUM 8.8* 9.0 8.7* 8.9 8.8*  MG  --   --  2.4  --   --   PHOS  --   --  4.4  --   --     GFR: Estimated Creatinine Clearance: 34.1 mL/min (A) (by C-G formula based on SCr of 1.73 mg/dL (H)). Recent Labs  Lab 02/06/22 1259 02/07/22 0320 02/08/22 0024 02/09/22 0024 02/10/22 0025  PROCALCITON  --   --   --  0.25 0.13  WBC 10.3 7.6 8.0 8.5  --      Liver Function Tests: Recent Labs  Lab 02/06/22 1259 02/08/22 0024  AST 18 26  ALT 12 15  ALKPHOS 94 76  BILITOT 1.0 1.3*  PROT 7.2 6.1*  ALBUMIN 4.0 3.3*    Recent Labs  Lab 02/06/22 1259  LIPASE 55*    No results for input(s): "AMMONIA" in the last 168 hours.  ABG    Component Value Date/Time   PHART 7.33 (L) 02/09/2022 1725   PCO2ART 44 02/09/2022 1725   PO2ART 62 (L) 02/09/2022 1725   HCO3 23.2 02/09/2022 1725   TCO2 19  05/24/2013 1633   ACIDBASEDEF 2.8 (H) 02/09/2022 1725   O2SAT 93.6 02/09/2022 1725     Coagulation Profile: Recent Labs  Lab 02/06/22 1259 02/07/22 0320 02/08/22 0024 02/09/22 0024 02/10/22 0025  INR 2.0* 2.2* 2.7* 2.8* 3.2*     Cardiac Enzymes: No results for input(s): "CKTOTAL", "CKMB", "CKMBINDEX", "TROPONINI" in the last 168 hours.  HbA1C: Hgb A1c MFr Bld  Date/Time Value Ref Range Status  07/05/2021 08:49 AM 5.5 4.6 - 6.5 % Final    Comment:    Glycemic Control Guidelines for People with Diabetes:Non Diabetic:  <  6%Goal of Therapy: <7%Additional Action Suggested:  >8%   04/30/2020 11:57 AM 5.3 4.6 - 6.5 % Final    Comment:    Glycemic Control Guidelines for People with Diabetes:Non Diabetic:  <6%Goal of Therapy: <7%Additional Action Suggested:  >8%     CBG: No results for input(s): "GLUCAP" in the last 168 hours.  Review of Systems:     Past Medical History:  He,  has a past medical history of (HFimpEF) heart failure with improved ejection fraction ([place]), AAA (abdominal aortic aneurysm) ([place]), Aortic calcification ([place]) (05/01/2013), Bilateral renal artery stenosis ([place]), Carotid arterial disease ([place]), Coronary artery disease, History of prior cigarette smoking (04/11/2008), Hypertension, Hypothyroidism, Intraventricular hemorrhage ([place]) (07/12/2012), Ischemic cardiomyopathy, Moderate aortic stenosis, Peripheral arterial disease ([place]), Right leg DVT ([place]) (08/13/2012), Subclavian artery stenosis, left ([place]) (05/2012), and Venous insufficiency.   Surgical History:   Past Surgical History:  Procedure Laterality Date   ABDOMINAL ANGIOGRAM  07/10/2013   WITH BI-FEMORAL RUNOFF       DR Fletcher Anon   ABDOMINAL AORTAGRAM N/A 12/26/2012   Procedure: ABDOMINAL Maxcine Ham;  Surgeon: Wellington Hampshire, MD;  Location: [facility] CATH [facility];  Service: Cardiovascular;  Laterality: N/A;   ABDOMINAL AORTAGRAM N/A 07/10/2013   Procedure: ABDOMINAL Maxcine Ham;  Surgeon: Wellington Hampshire, MD;   Location: [facility] CATH LAB;  Service: Cardiovascular;  Laterality: N/A;   ABDOMINAL AORTOGRAM W/LOWER EXTREMITY N/A 05/12/2021   Procedure: ABDOMINAL AORTOGRAM W/LOWER EXTREMITY;  Surgeon: Wellington Hampshire, MD;  Location: [facility] CV LAB;  Service: Cardiovascular;  Laterality: N/A;   ANGIOPLASTY / STENTING FEMORAL  05/2012   ANGIOPLASTY / STENTING ILIAC Bilateral 12/26/2012   ARCH AORTOGRAM N/A 06/20/2012   Procedure: ARCH AORTOGRAM;  Surgeon: Wellington Hampshire, MD;  Location: [facility] CATH LAB;  Service: Cardiovascular;  Laterality: N/A;   CARDIAC CATHETERIZATION  05/2012   [place]   CHOLECYSTECTOMY  1990's   CORONARY ARTERY BYPASS GRAFT  07/11/2011   Procedure: CORONARY ARTERY BYPASS GRAFTING (CABG);  Surgeon: Grace Isaac, MD;  Location: [facility];  Service: Open Heart Surgery;  Laterality: N/A;  Times 3. On Pump. Using right greater saphenous vein and left internal mammary artery.    CORONARY ARTERY BYPASS GRAFT     HERNIA REPAIR     INSERTION OF ILIAC STENT Bilateral 12/26/2012   Procedure: INSERTION OF ILIAC STENT;  Surgeon: Wellington Hampshire, MD;  Location: [facility] CATH LAB;  Service: Cardiovascular;  Laterality: Bilateral;  Bilateral Common Iliac Artery   LEFT AND RIGHT HEART CATHETERIZATION WITH CORONARY ANGIOGRAM  07/07/2011   Procedure: LEFT AND RIGHT HEART CATHETERIZATION WITH CORONARY ANGIOGRAM;  Surgeon: Minna Merritts, MD;  Location: [facility] CATH LAB;  Service: Cardiovascular;;   LOWER EXTREMITY VENOGRAPHY Bilateral 12/30/2016   Procedure: Lower Extremity Venography;  Surgeon: Katha Cabal, MD;  Location: [facility] CV LAB;  Service: Cardiovascular;  Laterality: Bilateral;   PERCUTANEOUS STENT INTERVENTION  06/20/2012   Procedure: PERCUTANEOUS STENT INTERVENTION;  Surgeon: Wellington Hampshire, MD;  Location: [facility] CATH LAB;  Service: Cardiovascular;;   RENAL ARTERY ANGIOPLASTY Left 07/10/2013   DR ARIDA   RIGHT/LEFT HEART CATH AND CORONARY/GRAFT ANGIOGRAPHY N/A 03/15/2021   Procedure:  RIGHT/LEFT HEART CATH AND CORONARY/GRAFT ANGIOGRAPHY;  Surgeon: Wellington Hampshire, MD;  Location: [facility] LAB;  Service: Cardiovascular;  Laterality: N/A;   SUBCLAVIAN ARTERY STENT  05/2012   "2 stents" (12/26/2012)   TOOTH EXTRACTION  Spring 2017   mulitple (6)   VENA CAVA FILTER PLACEMENT  07/2012  Removable     Social History:   reports that he quit smoking about 11 years ago. His smoking use included cigarettes. He has a 60.00 pack-year smoking history. He has never used smokeless tobacco. He reports that he does not drink alcohol and does not use drugs.   Family History:  His family history includes Alcohol abuse in his father; Cirrhosis in his father; Hypothyroidism in his sister.   Allergies Allergies  Allergen Reactions   Plavix [Clopidogrel Bisulfate]     Brain hemorrhage prev while on plavix and aspirin     Home Medications  Prior to Admission medications   Medication Sig Start Date End Date Taking? Authorizing Provider  amLODipine (NORVASC) 5 MG tablet TAKE 1 TABLET BY MOUTH EVERY DAY Patient taking differently: Take 5 mg by mouth daily. 01/12/22  Yes Wellington Hampshire, MD  atorvastatin (LIPITOR) 20 MG tablet TAKE 1 TABLET BY MOUTH EVERY DAY Patient taking differently: Take 20 mg by mouth daily. 12/24/21  Yes Wellington Hampshire, MD  carvedilol (COREG) 6.25 MG tablet TAKE 1 TABLET BY MOUTH TWICE A DAY WITH MEALS Patient taking differently: Take 6.25 mg by mouth 2 (two) times daily with a meal. 08/03/21  Yes Copland, Frederico Hamman, MD  cilostazol (PLETAL) 50 MG tablet TAKE 1 TABLET BY MOUTH TWICE A DAY Patient taking differently: Take 50 mg by mouth 2 (two) times daily. 08/03/21  Yes Wellington Hampshire, MD  furosemide (LASIX) 40 MG tablet Take 0.5 tablets (20 mg total) by mouth as directed. Take 20 mg once daily with extra 20 mg as needed for weight gain of 3 pounds overnight or 5 pounds in one week 01/19/22  Yes Theora Gianotti, NP  levothyroxine (SYNTHROID) 150 MCG  tablet [place] BREAKFAST Patient taking differently: Take 150 mcg by mouth daily before breakfast. TAKE ONE TABLET BY MOUTH EVERY DAY BEFORE BREAKFAST 08/03/21  Yes Copland, Frederico Hamman, MD  mupirocin ointment (BACTROBAN) 2 % Apply 1 Application topically daily as needed (rash). 10/20/21  Yes [provider]  triamcinolone ointment (KENALOG) 0.1 % APPLY TOPICALLY TO AFFECTED AREAS TWICE A DAY FOR 14 DAYS THEN MAY APPLY 5 DAYS WEEKLY TO AFFECTED AREAS Patient taking differently: Apply 1 Application topically daily as needed (rash). 12/21/21  Yes Ralene Bathe, MD  warfarin (COUMADIN) 5 MG tablet Take 0.5-1 tablets (2.5-5 mg total) by mouth See admin instructions. TAKE 1/2 TABLET DAILY BY MOUTH EXCEPT TAKE 1 TABLET ON MON, WED, FRI OR AS DIRECTED ANTICOAGULATION CLINIC 03/12/21  Yes Copland, Frederico Hamman, MD     Critical care time: NA   JD Rexene Agent [facility] 02/10/2022, 11:11 AM  Please see [email or web address] for pager details.  From 7A-7P if no response, please call [phone]. After hours, please call ELink [phone].

## 2022-02-10 NOTE — Progress Notes (Signed)
[facility] PROGRESS NOTE    Progress Note  [name]  JSE:[id number] DOB: February 16, 1941 DOA: 02/06/2022 PCP: Owens Loffler, MD     Brief Narrative:   [name] is an 81 y.o. male past medical history of CAD status post CABG, ischemic cardiomyopathy, chronic kidney disease stage IIIa with a baseline creatinine 1.5, paroxysmal atrial fibrillation on chronic Coumadin comes into the hospital after a fall found to have right fracture, pneumothorax and subcutaneous emphysema.  He initially presented to [facility] but was transferred to [facility] to the trauma service.  Did not had a syncopal event chest pain or shortness of breath.   Assessment/Plan:   Pneumothorax with subcutaneous emphysema/acute respiratory failure with hypoxia secondary to a fall in the setting of rib fractures: Trauma was consulted as well as IR, chest tube was placed. Repeated chest x-ray today showed resolution of pneumothorax, chest tube placed to waterseal. CT of the chest this morning is pending to evaluate lung parenchyma, it will also help [place] evaluate traumatic pneumothorax. At high of aspiration and delirium.  Acute respiratory failure with hypoxia: Was using 4 L, oxygen requirements are decreasing now on 10 L of oxygen try to keep saturations greater than 90%. Hemoglobin appears stable, his INR is therapeutic. Pulmonary and critical care was consulted they recommended a CT of the chest to evaluate lung parenchyma. They are concerned about a mixed etiology hypervolemia, lung contusion and COPD  A-fib with RVR: Likely due to stress, was placed on Cardizem overnight. Became hypotensive blood pressure drop both medications were stopped. Now in sinus rhythm rate controlled, INR is therapeutic at 3.2. Blood pressure is coming up very nicely it was 130/53.  Coreg was restarted this morning he seems to be tolerating well. 2D echo showed an EF of 07% no diastolic dysfunction appreciated.  Nondisplaced  right rib fifth fracture posterior along with seventh and eighth: Currently on lidocaine patch, incentive spirometry Robaxin. He is also on oxycodone with pain not well controlled.  Abnormal aortic aneurysm: CT scan of the abdomen and pelvis done that showed his widest diameter of 4.5 cm. We will need a follow-up imaging in 6 months with vascular as an outpatient.  Coronary artery disease: With history of CABG in 2013. Positive cath in 2022 that showed patent grafts. Denies any type of chest pain. Continue  Lipitor, hold Coreg due to episode of hypotension.  Ischemic cardiomyopathy: With a 2D echo in November 2022 with an EF of 37%, grade 2 diastolic heart failure severe aortic stenosis.  Peripheral arterial disease: ABI last year showed left lower extremity duplex showing significant bilateral SFA disease with collateral flow bilaterally. Scheduled for repeat in December 2023. Continue Lipitor by following Coumadin.  Moderate aortic stenosis: Moderate aortic stenosis by echo on 02/09/2022 mean gradient 16.5 valve area is 2.6 m/s.  DVT: Continue Coumadin per pharmacy okayed by trauma.  Essential hypertension: Blood pressure slowly trending up we will restart Coreg.  Acute kidney injury on chronic kidney disease stage IIIa: With a baseline creatinine 1.3-1.5.  There is likely prerenal azotemia and the episode of hypotension This morning is 1.7, his urine appears to be concentrated, his urine will continue to improve as his blood pressure stabilizes.  Recheck basic metabolic panel in the morning.  Hyperlipidemia: Due to Lipitor.  COPD: No signs of exacerbation.  Hypothyroidism: Continue Synthroid.   DVT prophylaxis: coumadin Family Communication:none Status is: Inpatient Remains inpatient appropriate because: Traumatic fall leading to rib fracture and pneumothorax.  Code Status:     Code Status Orders  (From admission, onward)           Start     Ordered    02/06/22 1635  Full code  Continuous        02/06/22 1635           Code Status History     Date Active Date Inactive Code Status Order ID Comments User Context   05/12/2021 0959 05/12/2021 1817 Full Code [id number]  Wellington Hampshire, MD Inpatient   03/15/2021 1149 03/15/2021 1912 Full Code [id number]  Wellington Hampshire, MD Inpatient   12/29/2016 1742 12/31/2016 1935 Full Code [id number]  Hillary Bow, MD ED   12/24/2016 0500 12/25/2016 1419 Full Code [id number]  Saundra Shelling, MD Inpatient   07/10/2013 1029 07/11/2013 1240 Full Code [id number]  Wellington Hampshire, MD Inpatient   05/24/2013 1840 05/29/2013 1619 Full Code [id number]  Eugenie Filler, MD Inpatient   07/11/2011 1321 07/22/2011 1808 Full Code [id number]  Grace Isaac, MD Inpatient         IV Access:   Peripheral IV   Procedures and diagnostic studies:   ECHOCARDIOGRAM COMPLETE  Result Date: 02/09/2022    ECHOCARDIOGRAM REPORT   Patient Name:   [name] Date of Exam: 02/09/2022 Medical Rec #:  [id number]      Height:       69.0 in Accession #:    [id number]     Weight:       163.0 lb Date of Birth:  03/20/1941       BSA:          1.894 m Patient Age:    45 years       BP:           91/51 mmHg Patient Gender: M              HR:           101 bpm. Exam Location:  Inpatient Procedure: 2D Echo, Cardiac Doppler and Color Doppler Indications:    R94.31 Abnormal EKG  History:        Patient has prior history of Echocardiogram examinations, most                 recent 02/26/2021. CAD, Abnormal ECG and Prior CABG, Pulmonary                 HTN, Arrythmias:Atrial Fibrillation, Signs/Symptoms:Edema; Risk                 Factors:Hypertension and Current Smoker. Pneumothorax.  Sonographer:    Roseanna Rainbow RDCS Referring Phys: [place]  Sonographer Comments: Technically difficult study due to poor echo windows, suboptimal parasternal window and no subcostal window. Difficult study, patient supine. Pleural drain in place and paitent  in pain. IMPRESSIONS  1. Left ventricular ejection fraction, by estimation, is 55 to 60%. The left ventricle has normal function. The left ventricle has no regional wall motion abnormalities. Left ventricular diastolic function could not be evaluated.  2. Right ventricular systolic function is normal. The right ventricular size is normal. There is mildly elevated pulmonary artery systolic pressure. The estimated right ventricular systolic pressure is 23.7 mmHg.  3. Left atrial size was mildly dilated.  4. Right atrial size was mildly dilated.  5. The mitral valve is normal in structure. Mild mitral valve regurgitation. No evidence of mitral stenosis.  6. The aortic valve is tricuspid. There is severe  calcifcation of the aortic valve. Aortic valve regurgitation is not visualized. Mild to moderate aortic valve stenosis. Aortic valve mean gradient measures 16.5 mmHg. Aortic valve Vmax measures 2.66 m/s.  7. The inferior vena cava is normal in size with greater than 50% respiratory variability, suggesting right atrial pressure of 3 mmHg. FINDINGS  Left Ventricle: Left ventricular ejection fraction, by estimation, is 55 to 60%. The left ventricle has normal function. The left ventricle has no regional wall motion abnormalities. The left ventricular internal cavity size was normal in size. There is  no left ventricular hypertrophy. Left ventricular diastolic function could not be evaluated due to atrial fibrillation. Left ventricular diastolic function could not be evaluated. Right Ventricle: The right ventricular size is normal. No increase in right ventricular wall thickness. Right ventricular systolic function is normal. There is mildly elevated pulmonary artery systolic pressure. The tricuspid regurgitant velocity is 2.57  m/s, and with an assumed right atrial pressure of 8 mmHg, the estimated right ventricular systolic pressure is 37.9 mmHg. Left Atrium: Left atrial size was mildly dilated. Right Atrium: Right atrial  size was mildly dilated. Pericardium: There is no evidence of pericardial effusion. Mitral Valve: The mitral valve is normal in structure. Mild mitral valve regurgitation. No evidence of mitral valve stenosis. Tricuspid Valve: The tricuspid valve is normal in structure. Tricuspid valve regurgitation is mild . No evidence of tricuspid stenosis. Aortic Valve: The aortic valve is tricuspid. There is severe calcifcation of the aortic valve. Aortic valve regurgitation is not visualized. Mild to moderate aortic stenosis is present. Aortic valve mean gradient measures 16.5 mmHg. Aortic valve peak gradient measures 28.4 mmHg. Aortic valve area, by VTI measures 1.24 cm. Pulmonic Valve: The pulmonic valve was normal in structure. Pulmonic valve regurgitation is not visualized. No evidence of pulmonic stenosis. Aorta: The aortic root is normal in size and structure. Venous: The inferior vena cava is normal in size with greater than 50% respiratory variability, suggesting right atrial pressure of 3 mmHg. IAS/Shunts: No atrial level shunt detected by color flow Doppler.  LEFT VENTRICLE PLAX 2D LVIDd:         5.00 cm LVIDs:         4.00 cm LV PW:         1.30 cm LV IVS:        1.00 cm LVOT diam:     2.20 cm LV SV:         63 LV SV Index:   34 LVOT Area:     3.80 cm  LV Volumes (MOD) LV vol d, MOD A2C: 85.9 ml LV vol d, MOD A4C: 77.0 ml LV vol s, MOD A2C: 37.3 ml LV vol s, MOD A4C: 38.7 ml LV SV MOD A2C:     48.6 ml LV SV MOD A4C:     77.0 ml LV SV MOD BP:      43.7 ml RIGHT VENTRICLE RV S prime:     6.09 cm/s TAPSE (M-mode): 0.7 cm LEFT ATRIUM             Index        RIGHT ATRIUM           Index LA diam:        3.90 cm 2.06 cm/m   RA Area:     17.40 cm LA Vol (A2C):   45.5 ml 24.02 ml/m  RA Volume:   47.60 ml  25.13 ml/m LA Vol (A4C):   40.5 ml 21.38 ml/m LA Biplane  Vol: 43.6 ml 23.02 ml/m  AORTIC VALVE AV Area (Vmax):    1.27 cm AV Area (Vmean):   1.19 cm AV Area (VTI):     1.24 cm AV Vmax:           266.50 cm/s AV  Vmean:          191.500 cm/s AV VTI:            0.513 m AV Peak Grad:      28.4 mmHg AV Mean Grad:      16.5 mmHg LVOT Vmax:         89.10 cm/s LVOT Vmean:        59.700 cm/s LVOT VTI:          0.167 m LVOT/AV VTI ratio: 0.33  AORTA Ao Root diam: 3.00 cm MITRAL VALVE                TRICUSPID VALVE MV Area (PHT): 4.58 cm     TR Peak grad:   26.4 mmHg MV Decel Time: 166 msec     TR Vmax:        257.00 cm/s MV E velocity: 114.50 cm/s                             SHUNTS                             Systemic VTI:  0.17 m                             Systemic Diam: 2.20 cm Glori Bickers MD Electronically signed by Glori Bickers MD Signature Date/Time: 02/09/2022/4:11:44 PM    Final    DG CHEST PORT 1 VIEW  Result Date: 02/09/2022 CLINICAL DATA:  Pneumothorax EXAM: PORTABLE CHEST 1 VIEW COMPARISON:  Chest x-ray dated February 08, 2022 FINDINGS: Cardiac and mediastinal contours are unchanged post median sternotomy and CABG. Right-sided chest tube in place. Visible pneumothorax. Slightly decreased pneumomediastinum. Similar diffuse subcutaneous air. No large pleural effusion. IMPRESSION: No visible pneumothorax.  Right-sided chest tube in place. Electronically Signed   By: Yetta Glassman M.D.   On: 02/09/2022 08:25     Medical Consultants:   None.   Subjective:    [name] relates his pain is controlled has not had a bowel movement.  Objective:    Vitals:   02/09/22 2300 02/09/22 2343 02/10/22 0255 02/10/22 0300  BP: 96/60 (!) 86/61  (!) 106/47  Pulse: (!) 112 100  78  Resp: '17 14  14  '$ Temp:  97.9 F (36.6 C)  97.9 F (36.6 C)  TempSrc:  Oral  Oral  SpO2: 98% 94% 94% 95%  Weight:      Height:       SpO2: 95 % O2 Flow Rate (L/min): 10 L/min FiO2 (%): 100 %   Intake/Output Summary (Last 24 hours) at 02/10/2022 3790 Last data filed at 02/09/2022 1939 Gross per 24 hour  Intake --  Output 40 ml  Net -40 ml    Filed Weights   02/06/22 1508  Weight: 73.9 kg     Exam: General exam: In no acute distress. Respiratory system: Good air movement and clear to auscultation. Cardiovascular system: S1 & S2 heard, RRR. No JVD. Gastrointestinal system: Abdomen is nondistended, soft and nontender.  Extremities: No pedal edema. Skin:  No rashes, lesions or ulcers Psychiatry: Judgement and insight appear normal. Mood & affect appropriate.   Data Reviewed:    Labs: Basic Metabolic Panel: Recent Labs  Lab 02/06/22 1259 02/07/22 0320 02/08/22 0024 02/09/22 0024 02/10/22 0025  NA 138 140 139 140 137  K 4.6 4.6 4.7 4.3 4.6  CL 105 106 106 107 106  CO2 '26 26 26 23 24  '$ GLUCOSE 107* 98 103* 108* 102*  BUN 25* 21 28* 36* 42*  CREATININE 1.51* 1.52* 1.90* 1.77* 1.73*  CALCIUM 8.8* 9.0 8.7* 8.9 8.8*  MG  --   --  2.4  --   --   PHOS  --   --  4.4  --   --     GFR Estimated Creatinine Clearance: 34.1 mL/min (A) (by C-G formula based on SCr of 1.73 mg/dL (H)). Liver Function Tests: Recent Labs  Lab 02/06/22 1259 02/08/22 0024  AST 18 26  ALT 12 15  ALKPHOS 94 76  BILITOT 1.0 1.3*  PROT 7.2 6.1*  ALBUMIN 4.0 3.3*    Recent Labs  Lab 02/06/22 1259  LIPASE 55*    No results for input(s): "AMMONIA" in the last 168 hours. Coagulation profile Recent Labs  Lab 02/06/22 1259 02/07/22 0320 02/08/22 0024 02/09/22 0024 02/10/22 0025  INR 2.0* 2.2* 2.7* 2.8* 3.2*    COVID-19 Labs  No results for input(s): "DDIMER", "FERRITIN", "LDH", "CRP" in the last 72 hours.  Lab Results  Component Value Date   SARSCOV2NAA NEGATIVE 11/09/2021   [place] NEGATIVE 02/12/2021    CBC: Recent Labs  Lab 02/06/22 1259 02/07/22 0320 02/08/22 0024 02/09/22 0024  WBC 10.3 7.6 8.0 8.5  NEUTROABS 7.9*  --   --  6.5  HGB 14.6 13.7 14.5 13.1  HCT 47.2 44.3 44.8 40.6  MCV 90.6 91.7 90.3 90.6  PLT 237 217 206 193    Cardiac Enzymes: No results for input(s): "CKTOTAL", "CKMB", "CKMBINDEX", "TROPONINI" in the last 168 hours. BNP (last 3  results) No results for input(s): "PROBNP" in the last 8760 hours. CBG: No results for input(s): "GLUCAP" in the last 168 hours. D-Dimer: No results for input(s): "DDIMER" in the last 72 hours. Hgb A1c: No results for input(s): "HGBA1C" in the last 72 hours. Lipid Profile: No results for input(s): "CHOL", "HDL", "LDLCALC", "TRIG", "CHOLHDL", "LDLDIRECT" in the last 72 hours. Thyroid function studies: No results for input(s): "TSH", "T4TOTAL", "T3FREE", "THYROIDAB" in the last 72 hours.  Invalid input(s): "FREET3" Anemia work up: No results for input(s): "VITAMINB12", "FOLATE", "FERRITIN", "TIBC", "IRON", "RETICCTPCT" in the last 72 hours. Sepsis Labs: Recent Labs  Lab 02/06/22 1259 02/07/22 0320 02/08/22 0024 02/09/22 0024 02/10/22 0025  PROCALCITON  --   --   --  0.25 0.13  WBC 10.3 7.6 8.0 8.5  --     Microbiology No results found for this or any previous visit (from the past 240 hour(s)).   Medications:    atorvastatin  20 mg Oral Daily   carvedilol  6.25 mg Oral BID WC   cilostazol  50 mg Oral BID   dextromethorphan-guaiFENesin  1 tablet Oral BID   ipratropium-albuterol  3 mL Nebulization QID   levothyroxine  150 mcg Oral QAC breakfast   lidocaine  1 patch Transdermal Q24H   sodium chloride flush  10 mL Intrapleural Q8H   Warfarin - Pharmacist Dosing Inpatient   Does not apply q1600   Continuous Infusions:  diltiazem (CARDIZEM) infusion Stopped (02/09/22 0905)   methocarbamol (ROBAXIN) IV  LOS: 3 days   Charlynne Cousins  [facility]  02/10/2022, 7:02 AM

## 2022-02-10 NOTE — Progress Notes (Signed)
ANTICOAGULATION CONSULT NOTE   Pharmacy Consult for warfarin Indication: Hx VTE/AF  Allergies  Allergen Reactions   Plavix [Clopidogrel Bisulfate]     Brain hemorrhage prev while on plavix and aspirin    Patient Measurements: Height: '5\' 9"'$  (175.3 cm) Weight: 73.9 kg (162 lb 15.8 oz) IBW/kg (Calculated) : 70.7   Vital Signs: Temp: 97.5 F (36.4 C) (10/19 0728) Temp Source: Oral (10/19 0728) BP: 115/45 (10/19 0728) Pulse Rate: 82 (10/19 0728)  Labs: Recent Labs    02/08/22 0024 02/09/22 0024 02/10/22 0025  HGB 14.5 13.1  --   HCT 44.8 40.6  --   PLT 206 193  --   LABPROT 28.7* 29.6* 32.2*  INR 2.7* 2.8* 3.2*  CREATININE 1.90* 1.77* 1.73*     Estimated Creatinine Clearance: 34.1 mL/min (A) (by C-G formula based on SCr of 1.73 mg/dL (H)).   Medical History: Past Medical History:  Diagnosis Date   (HFimpEF) heart failure with improved ejection fraction ([place])    a. 06/2011 Echo: EF 35-40%; b. 06/2012 Echo: EF 50%; c. 12/2016 Echo: EF 55-60%, no rwma, GRI DD, mild AS/MR, nl RV fxn, nl RVSP; d. 02/2021 Echo: EF 55%, GrII DD. Mild MR. Mild to mod TR. Sev AS by VTI.   AAA (abdominal aortic aneurysm) ([place])    a. 05/2020 Abd u/s: 3.6cm.   Aortic calcification ([facility]) 05/01/2013   Bilateral renal artery stenosis ([place])    a. 06/2013 Angio/PTA: LRA: 95 (5x18 Herculink stent), RRA 60ost; b. 04/2021 Angio: mod RRA stenosis w/ patent LRA stent.   Carotid arterial disease ([place])    a. 05/2020 Carotid U/S: RICA 7-02%, LICA 63-78%   Coronary artery disease    a. 2013 s/p CABG x 3 (LIMA->LAD, VG->OM, VG->RPDA; b. 06/2012 MV: no ischemia; c. 02/2021 Cath: LM 100, RCA 90ost, 182m free LIMA->LAD nl, VG->OM2 nl, VG->RPDA nl. Mod AS (mean grad 172mg, AVA 1.1cm^2). RHC w/ elev filling pressures.   History of prior cigarette smoking 04/11/2008   Qualifier: Diagnosis of  By: BeDiona BrownerD, Amy     Hypertension    Hypothyroidism    Intraventricular hemorrhage ([place]/20/2014   Ischemic  cardiomyopathy    a. 06/2011 Echo: EF 35-40%; b. 06/2012 Echo: EF 50%; c. 12/2016 Echo: EF 55-60%, no rwma, GRI DD; d. 02/2021 Echo: Ef 55%, GrII DD.   Moderate aortic stenosis    a. 02/2021 Echo: EF 55%, GrII DD. Sev Ca2+ of AoV. Sev AS w/ AVA by VTI 0.79cm^2; b. 02/2021 Cath: Mod AS w/ mean grad 1529m. AVA 1.1cm^2.   [place]ipheral arterial disease ([place]  a. Previous left lower extremity stenting by Dr. SanJamal Collinb. 12/2012 s/p bilat ostial common iliac stenting; c. 04/2021 Angio: Patent LRA stent, mod RRA stenosis. Small AAA. Sev plaque RSFA 2/ subtl occl of inf branch of profunda. Diff Ca2+ SFA dzs w/ collats from profunda and 3V runoff. Diffuse LSFA dzs w/ collats from profunda. Subtl PT dzs. 3V runoff-->Med Rx.   Right leg DVT ([place]4/21/2014   Subclavian artery stenosis, left ([place]CC[place]2/2014   Status post stenting of the ostium and self-expanding stent placement to the left axillary artery   Venous insufficiency     Assessment: [age]M presenting s/p fall, hx VTE and pAfib on warfarin PTA.  Pharmacy consulted to restart home warfarin. OK to continue warfarin per surgery/IR  PTA dosing: 2.'5mg'$  daily except '5mg'$  MWF  INR continues to trend up to 3.2 this morning after low dose given last night.  CBC has been stable.   Goal of Therapy:  INR 2-3 Monitor platelets by anticoagulation protocol: Yes   Plan:  Given trend up will give low dose today - '1mg'$  Daily INR, s/s bleeding  Erin Hearing PharmD., BCPS Clinical Pharmacist 02/10/2022 8:34 AM

## 2022-02-10 NOTE — Progress Notes (Signed)
Physical Therapy Treatment Patient Details Name: [name] MRN: [id number] DOB: 04-17-1941 Today's Date: 02/10/2022   History of Present Illness 81 year old male who presented after a fall. Found to have R rib fxs 5-8 and pneumothorax with chest tube placement. PMHx: CAD status post CABG, ischemic cardiomyopathy, HTN, HLD, hypothyroidism, CKD 3A with baseline creatinine around 1.5, PAF on chronic Coumadin    PT Comments    Pt received OOB in recliner with great progress towards acute goals, however pt continues to be limited by increased O2 demands, decreased activity tolerance and general weakness. Pt demonstrating gait with RW and min guard for 200' in hall with intermittent standing rest breaks as pt ambulating on 10L O2 with SpO2 dropping to 88% and recovering with standing rest and cues for breathing techniques to >92%. Pt and spouse receptive to education and encouragement for increased mobility. Pt continues to benefit from skilled PT services to progress toward functional mobility goals.    Recommendations for follow up therapy are one component of a multi-disciplinary discharge planning process, led by the attending physician.  Recommendations may be updated based on patient status, additional functional criteria and insurance authorization.  Follow Up Recommendations  Home health PT     Assistance Recommended at Discharge Frequent or constant Supervision/Assistance  Patient can return home with the following A little help with walking and/or transfers;A little help with bathing/dressing/bathroom;Help with stairs or ramp for entrance;Assist for transportation;Assistance with cooking/housework   Equipment Recommendations  None recommended by PT    Recommendations for Other Services       Precautions / Restrictions Precautions Precautions: Fall Restrictions Weight Bearing Restrictions: No     Mobility  Bed Mobility Overal bed mobility: Needs Assistance              General bed mobility comments: pt OOB in recliner pre and post session    Transfers Overall transfer level: Needs assistance Equipment used: Rolling walker (2 wheels) Transfers: Sit to/from Stand, Bed to chair/wheelchair/BSC Sit to Stand: Min guard           General transfer comment: Cues for hand placement. No physical assist needed    Ambulation/Gait Ambulation/Gait assistance: Min guard Gait Distance (Feet): 200 Feet Assistive device: Rolling walker (2 wheels) Gait Pattern/deviations: Step-through pattern, Decreased stride length, Antalgic Gait velocity: decr     General Gait Details: slow but steady gait with RW, pt needing cues for RW proximity as pt pushing RW too far out in front, SpO2 dropping to 88% on 10L, recovering to>92% with standing rest and cues for breathing tehniques   Stairs             Wheelchair Mobility    Modified Rankin (Stroke Patients Only)       Balance Overall balance assessment: Needs assistance Sitting-balance support: Feet supported, No upper extremity supported Sitting balance-Leahy Scale: Good     Standing balance support: Bilateral upper extremity supported, Reliant on assistive device for balance Standing balance-Leahy Scale: Fair Standing balance comment: needs external support but was able to let go of walker with both hands to adjust clothing in standing                            Cognition Arousal/Alertness: Awake/alert Behavior During Therapy: WFL for tasks assessed/performed Overall Cognitive Status: Within Functional Limits for tasks assessed  Exercises      General Comments General comments (skin integrity, edema, etc.): Pt most limited by pain today. First time walking in room and to bathroom. Pt tolerated well. Pt on full 10L O2 during session and stayed above 90%.      Pertinent Vitals/Pain Pain Assessment Pain Assessment:  Faces Faces Pain Scale: Hurts little more Pain Location: R flank with mobility Pain Descriptors / Indicators: Grimacing, Discomfort Pain Intervention(s): Monitored during session, Limited activity within patient's tolerance    Home Living                          Prior Function            PT Goals (current goals can now be found in the care plan section) Acute Rehab PT Goals PT Goal Formulation: With patient Time For Goal Achievement: 02/23/22    Frequency    Min 3X/week      PT Plan      Co-evaluation              AM-PAC PT "6 Clicks" Mobility   Outcome Measure  Help needed turning from your back to your side while in a flat bed without using bedrails?: A Little Help needed moving from lying on your back to sitting on the side of a flat bed without using bedrails?: A Little Help needed moving to and from a bed to a chair (including a wheelchair)?: A Little Help needed standing up from a chair using your arms (e.g., wheelchair or bedside chair)?: A Little Help needed to walk in hospital room?: A Little Help needed climbing 3-5 steps with a railing? : Total 6 Click Score: 16    End of Session Equipment Utilized During Treatment: Oxygen Activity Tolerance: Patient tolerated treatment well Patient left: in chair;with call bell/phone within reach;with family/visitor present Nurse Communication: Mobility status PT Visit Diagnosis: Muscle weakness (generalized) (M62.81);Difficulty in walking, not elsewhere classified (R26.2)     Time: 9833-8250 PT Time Calculation (min) (ACUTE ONLY): 23 min  Charges:  $Gait Training: 23-37 mins                     Maedell Hedger R. PTA Acute Rehabilitation Services Office: [place] 02/10/2022, 2:53 PM

## 2022-02-11 DIAGNOSIS — S27321D Contusion of lung, unilateral, subsequent encounter: Secondary | ICD-10-CM | POA: Diagnosis not present

## 2022-02-11 DIAGNOSIS — J9601 Acute respiratory failure with hypoxia: Secondary | ICD-10-CM | POA: Diagnosis not present

## 2022-02-11 DIAGNOSIS — J449 Chronic obstructive pulmonary disease, unspecified: Secondary | ICD-10-CM | POA: Diagnosis not present

## 2022-02-11 LAB — PROTIME-INR
INR: 3.4 — ABNORMAL HIGH (ref 0.8–1.2)
Prothrombin Time: 34.2 seconds — ABNORMAL HIGH (ref 11.4–15.2)

## 2022-02-11 LAB — BASIC METABOLIC PANEL
Anion gap: 7 (ref 5–15)
BUN: 33 mg/dL — ABNORMAL HIGH (ref 8–23)
CO2: 24 mmol/L (ref 22–32)
Calcium: 8.7 mg/dL — ABNORMAL LOW (ref 8.9–10.3)
Chloride: 107 mmol/L (ref 98–111)
Creatinine, Ser: 1.35 mg/dL — ABNORMAL HIGH (ref 0.61–1.24)
GFR, Estimated: 53 mL/min — ABNORMAL LOW (ref >60)
Glucose, Bld: 101 mg/dL — ABNORMAL HIGH (ref 70–99)
Potassium: 4.1 mmol/L (ref 3.5–5.1)
Sodium: 138 mmol/L (ref 135–145)

## 2022-02-11 LAB — PROCALCITONIN: Procalcitonin: <0.1 ng/mL

## 2022-02-11 MED ORDER — ENSURE ENLIVE PO LIQD
237.0000 mL | Freq: Two times a day (BID) | ORAL | Status: DC
  Administered 2022-02-11 – 2022-02-12 (×2): 237 mL via ORAL

## 2022-02-11 MED ORDER — ADULT MULTIVITAMIN W/MINERALS CH
1.0000 | ORAL_TABLET | Freq: Every day | ORAL | Status: DC
  Administered 2022-02-11 – 2022-02-12 (×2): 1 via ORAL
  Filled 2022-02-11 (×2): qty 1

## 2022-02-11 MED ORDER — TRAMADOL HCL 50 MG PO TABS: 50.0000 mg | ORAL_TABLET | Freq: Four times a day (QID) | ORAL | Status: DC | PRN

## 2022-02-11 NOTE — Progress Notes (Signed)
[facility] PROGRESS NOTE    Progress Note  [name]  DZH:[id number] DOB: Jan 16, 1941 DOA: 02/06/2022 PCP: Owens Loffler, MD     Brief Narrative:   [name] is an 81 y.o. male past medical history of CAD status post CABG, ischemic cardiomyopathy, chronic kidney disease stage IIIa with a baseline creatinine 1.5, paroxysmal atrial fibrillation on chronic Coumadin comes into the hospital after a fall found to have right fracture, pneumothorax and subcutaneous emphysema.  He initially presented to [facility] but was transferred to [facility] to the trauma service.  Did not had a syncopal event chest pain or shortness of breath.   Assessment/Plan:   Pneumothorax with subcutaneous emphysema/acute respiratory failure with hypoxia secondary to a fall in the setting of rib fractures: Trauma was consulted as well as IR, chest tube was placed. CT of the head showed a new right lower lobe opacity likely a contusion. He is now requiring 3 L of oxygen to keep saturations greater than 90%. Transferred to [facility].  Acute respiratory failure with hypoxia: Requiring 3 L of oxygen to keep saturations greater 90%. PCCM was consulted they were concerned about a mixed etiology hypervolemia, lung contusion and COPD.  A-fib with RVR: Likely due to stress, development of episode of hypotension with Cardizem. Now resolved on Coreg. INR therapeutic. 2D echo showed an EF of 41% no diastolic dysfunction appreciated. INR slightly is supratherapeutic related  Nondisplaced right rib fifth fracture posterior along with seventh and eighth: Currently on lidocaine patch, incentive spirometry Robaxin. He is also on oxycodone with pain not well controlled.  Abnormal aortic aneurysm: CT scan of the abdomen and pelvis done that showed his widest diameter of 4.5 cm. We will need a follow-up imaging in 6 months with vascular as an outpatient.  Coronary artery disease: With history of CABG in  2013. Positive cath in 2022 that showed patent grafts. Denies any type of chest pain. Continue  Lipitor, hold Coreg due to episode of hypotension.  Ischemic cardiomyopathy: With a 2D echo in November 2022 with an EF of 96%, grade 2 diastolic heart failure severe aortic stenosis.  Peripheral arterial disease: ABI last year showed left lower extremity duplex showing significant bilateral SFA disease with collateral flow bilaterally. Scheduled for repeat in December 2023. Continue Lipitor by following Coumadin.  Moderate aortic stenosis: Moderate aortic stenosis by echo on 02/09/2022 mean gradient 16.5 valve area is 2.6 m/s.  DVT: Continue Coumadin per pharmacy okayed by trauma.  Essential hypertension: Blood pressure slowly trending up we will restart Coreg.  Acute kidney injury on chronic kidney disease stage IIIa: With a baseline creatinine 1.3-1.5.  There is likely prerenal azotemia and the episode of hypotension This morning is 1.7, his urine appears to be concentrated, his urine will continue to improve as his blood pressure stabilizes.  Recheck basic metabolic panel in the morning.  Hyperlipidemia: Due to Lipitor.  COPD: No signs of exacerbation.  Hypothyroidism: Continue Synthroid.   DVT prophylaxis: coumadin Family Communication:none Status is: Inpatient Remains inpatient appropriate because: Traumatic fall leading to rib fracture and pneumothorax.    Code Status:     Code Status Orders  (From admission, onward)           Start     Ordered   02/06/22 1635  Full code  Continuous        02/06/22 1635           Code Status History     Date Active Date Inactive Code  Status Order ID Comments User Context   05/12/2021 0959 05/12/2021 1817 Full Code [id number]  Wellington Hampshire, MD Inpatient   03/15/2021 1149 03/15/2021 1912 Full Code [id number]  Wellington Hampshire, MD Inpatient   12/29/2016 1742 12/31/2016 1935 Full Code [id number]  Hillary Bow, MD ED    12/24/2016 0500 12/25/2016 1419 Full Code [id number]  Saundra Shelling, MD Inpatient   07/10/2013 1029 07/11/2013 1240 Full Code [id number]  Wellington Hampshire, MD Inpatient   05/24/2013 1840 05/29/2013 1619 Full Code [id number]  Eugenie Filler, MD Inpatient   07/11/2011 1321 07/22/2011 1808 Full Code [id number]  Grace Isaac, MD Inpatient         IV Access:   Peripheral IV   Procedures and diagnostic studies:   DG CHEST PORT 1 VIEW  Result Date: 02/10/2022 CLINICAL DATA:  Status post removal of right chest tube EXAM: PORTABLE CHEST 1 VIEW COMPARISON:  Previous studies including the examination done earlier today FINDINGS: There is interval removal of right chest tube. There is no pneumothorax. Linear patchy infiltrates are seen in lower lung fields, more so on the right side with interval worsening. There are no signs of alveolar pulmonary edema. There is minimal blunting of lateral CP angles. There is previous coronary artery bypass surgery. There is interval decrease in extensive subcutaneous emphysema in chest wall and neck on both sides. There is vascular stent in the course of left subclavian vessels. IMPRESSION: Interval removal of right chest tube. There is no pneumothorax. There are linear patchy densities in both lower lung fields suggesting atelectasis/pneumonia with interval worsening. There is decrease in amount of extensive subcutaneous emphysema in chest wall and neck on both sides. Electronically Signed   By: Elmer Picker M.D.   On: 02/10/2022 10:40   DG CHEST PORT 1 VIEW  Result Date: 02/10/2022 CLINICAL DATA:  Right pneumothorax. EXAM: PORTABLE CHEST 1 VIEW COMPARISON:  February 09, 2022. FINDINGS: Status post coronary bypass graft. Stable cardiomediastinal silhouette. Stable extensive subcutaneous emphysema is noted bilaterally. Stable right-sided chest tube. No definite pneumothorax is noted. Probable mild bibasilar subsegmental atelectasis is noted. IMPRESSION: Stable  extensive bilateral subcutaneous emphysema. Stable right-sided chest tube without definite pneumothorax. Stable bibasilar subsegmental atelectasis. Electronically Signed   By: Marijo Conception M.D.   On: 02/10/2022 08:13   CT CHEST WO CONTRAST  Result Date: 02/10/2022 CLINICAL DATA:  Respiratory illness with nondiagnostic x-ray EXAM: CT CHEST WITHOUT CONTRAST TECHNIQUE: Multidetector CT imaging of the chest was performed following the standard protocol without IV contrast. RADIATION DOSE REDUCTION: This exam was performed according to the departmental dose-optimization program which includes automated exposure control, adjustment of the mA and/or kV according to patient size and/or use of iterative reconstruction technique. COMPARISON:  Four days ago FINDINGS: Cardiovascular: Normal heart size. Extensive atheromatous calcification of the aorta and coronaries. Prior CABG. Mediastinum/Nodes: Pneumomediastinum which is moderate and new. No adenopathy or mass Lungs/Pleura: Right lateral and lower chest tube. Resolved pneumothorax. Opacity with volume loss and consolidative features in the right lower lobe with small volume effusion. Centrilobular emphysema. Upper Abdomen: No acute finding or visible injury. Partially covered right renal cysts. Cholecystectomy. Extensive atherosclerosis. Musculoskeletal: Progressive chest wall emphysema which is diffuse. Known anterior right fifth rib fracture. Known right posterior 6 through 8 rib fractures. Extensive spondylosis with multi-level bridging. IMPRESSION: 1. New opacity in the right lower lobe, likely a combination of atelectasis and pneumonia. 2. Resolved right pneumothorax after chest tube placement. There is extensive and progressed chest wall  emphysema with pneumomediastinum. 3. Aortic Atherosclerosis (ICD10-I70.0) and Emphysema (ICD10-J43.9). Electronically Signed   By: Jorje Guild M.D.   On: 02/10/2022 07:38   ECHOCARDIOGRAM COMPLETE  Result Date:  02/09/2022    ECHOCARDIOGRAM REPORT   Patient Name:   [name] Date of Exam: 02/09/2022 Medical Rec #:  [id number]      Height:       69.0 in Accession #:    [id number]     Weight:       163.0 lb Date of Birth:  25-Jan-1941       BSA:          1.894 m Patient Age:    44 years       BP:           91/51 mmHg Patient Gender: M              HR:           101 bpm. Exam Location:  Inpatient Procedure: 2D Echo, Cardiac Doppler and Color Doppler Indications:    R94.31 Abnormal EKG  History:        Patient has prior history of Echocardiogram examinations, most                 recent 02/26/2021. CAD, Abnormal ECG and Prior CABG, Pulmonary                 HTN, Arrythmias:Atrial Fibrillation, Signs/Symptoms:Edema; Risk                 Factors:Hypertension and Current Smoker. Pneumothorax.  Sonographer:    Roseanna Rainbow RDCS Referring Phys: [place]  Sonographer Comments: Technically difficult study due to poor echo windows, suboptimal parasternal window and no subcostal window. Difficult study, patient supine. Pleural drain in place and paitent in pain. IMPRESSIONS  1. Left ventricular ejection fraction, by estimation, is 55 to 60%. The left ventricle has normal function. The left ventricle has no regional wall motion abnormalities. Left ventricular diastolic function could not be evaluated.  2. Right ventricular systolic function is normal. The right ventricular size is normal. There is mildly elevated pulmonary artery systolic pressure. The estimated right ventricular systolic pressure is 56.4 mmHg.  3. Left atrial size was mildly dilated.  4. Right atrial size was mildly dilated.  5. The mitral valve is normal in structure. Mild mitral valve regurgitation. No evidence of mitral stenosis.  6. The aortic valve is tricuspid. There is severe calcifcation of the aortic valve. Aortic valve regurgitation is not visualized. Mild to moderate aortic valve stenosis. Aortic valve mean gradient measures 16.5 mmHg. Aortic  valve Vmax measures 2.66 m/s.  7. The inferior vena cava is normal in size with greater than 50% respiratory variability, suggesting right atrial pressure of 3 mmHg. FINDINGS  Left Ventricle: Left ventricular ejection fraction, by estimation, is 55 to 60%. The left ventricle has normal function. The left ventricle has no regional wall motion abnormalities. The left ventricular internal cavity size was normal in size. There is  no left ventricular hypertrophy. Left ventricular diastolic function could not be evaluated due to atrial fibrillation. Left ventricular diastolic function could not be evaluated. Right Ventricle: The right ventricular size is normal. No increase in right ventricular wall thickness. Right ventricular systolic function is normal. There is mildly elevated pulmonary artery systolic pressure. The tricuspid regurgitant velocity is 2.57  m/s, and with an assumed right atrial pressure of 8 mmHg, the estimated right ventricular systolic pressure is 34.4  mmHg. Left Atrium: Left atrial size was mildly dilated. Right Atrium: Right atrial size was mildly dilated. Pericardium: There is no evidence of pericardial effusion. Mitral Valve: The mitral valve is normal in structure. Mild mitral valve regurgitation. No evidence of mitral valve stenosis. Tricuspid Valve: The tricuspid valve is normal in structure. Tricuspid valve regurgitation is mild . No evidence of tricuspid stenosis. Aortic Valve: The aortic valve is tricuspid. There is severe calcifcation of the aortic valve. Aortic valve regurgitation is not visualized. Mild to moderate aortic stenosis is present. Aortic valve mean gradient measures 16.5 mmHg. Aortic valve peak gradient measures 28.4 mmHg. Aortic valve area, by VTI measures 1.24 cm. Pulmonic Valve: The pulmonic valve was normal in structure. Pulmonic valve regurgitation is not visualized. No evidence of pulmonic stenosis. Aorta: The aortic root is normal in size and structure. Venous: The  inferior vena cava is normal in size with greater than 50% respiratory variability, suggesting right atrial pressure of 3 mmHg. IAS/Shunts: No atrial level shunt detected by color flow Doppler.  LEFT VENTRICLE PLAX 2D LVIDd:         5.00 cm LVIDs:         4.00 cm LV PW:         1.30 cm LV IVS:        1.00 cm LVOT diam:     2.20 cm LV SV:         63 LV SV Index:   34 LVOT Area:     3.80 cm  LV Volumes (MOD) LV vol d, MOD A2C: 85.9 ml LV vol d, MOD A4C: 77.0 ml LV vol s, MOD A2C: 37.3 ml LV vol s, MOD A4C: 38.7 ml LV SV MOD A2C:     48.6 ml LV SV MOD A4C:     77.0 ml LV SV MOD BP:      43.7 ml RIGHT VENTRICLE RV S prime:     6.09 cm/s TAPSE (M-mode): 0.7 cm LEFT ATRIUM             Index        RIGHT ATRIUM           Index LA diam:        3.90 cm 2.06 cm/m   RA Area:     17.40 cm LA Vol (A2C):   45.5 ml 24.02 ml/m  RA Volume:   47.60 ml  25.13 ml/m LA Vol (A4C):   40.5 ml 21.38 ml/m LA Biplane Vol: 43.6 ml 23.02 ml/m  AORTIC VALVE AV Area (Vmax):    1.27 cm AV Area (Vmean):   1.19 cm AV Area (VTI):     1.24 cm AV Vmax:           266.50 cm/s AV Vmean:          191.500 cm/s AV VTI:            0.513 m AV Peak Grad:      28.4 mmHg AV Mean Grad:      16.5 mmHg LVOT Vmax:         89.10 cm/s LVOT Vmean:        59.700 cm/s LVOT VTI:          0.167 m LVOT/AV VTI ratio: 0.33  AORTA Ao Root diam: 3.00 cm MITRAL VALVE                TRICUSPID VALVE MV Area (PHT): 4.58 cm     TR Peak grad:  26.4 mmHg MV Decel Time: 166 msec     TR Vmax:        257.00 cm/s MV E velocity: 114.50 cm/s                             SHUNTS                             Systemic VTI:  0.17 m                             Systemic Diam: 2.20 cm Glori Bickers MD Electronically signed by Glori Bickers MD Signature Date/Time: 02/09/2022/4:11:44 PM    Final      Medical Consultants:   None.   Subjective:    Murvin Gift Bunch pain is controlled breathing is better.  Objective:    Vitals:   02/10/22 2327 02/11/22 0030 02/11/22 0143  02/11/22 0350  BP: 116/64   (!) 124/48  Pulse: 82  83 92  Resp: '18  20 17  '$ Temp: 97.6 F (36.4 C)   97.8 F (36.6 C)  TempSrc: Oral   Oral  SpO2: 93% 96% 94% 94%  Weight:      Height:       SpO2: 94 % O2 Flow Rate (L/min): 3 L/min FiO2 (%): 100 %   Intake/Output Summary (Last 24 hours) at 02/11/2022 0746 Last data filed at 02/11/2022 0614 Gross per 24 hour  Intake --  Output 1050 ml  Net -1050 ml    Filed Weights   02/06/22 1508  Weight: 73.9 kg    Exam: General exam: In no acute distress. Respiratory system: Good air movement and clear to auscultation. Cardiovascular system: S1 & S2 heard, RRR. No JVD. Gastrointestinal system: Abdomen is nondistended, soft and nontender.  Extremities: No pedal edema. Skin: No rashes, lesions or ulcers Psychiatry: Judgement and insight appear normal. Mood & affect appropriate.   Data Reviewed:    Labs: Basic Metabolic Panel: Recent Labs  Lab 02/07/22 0320 02/08/22 0024 02/09/22 0024 02/10/22 0025 02/11/22 0026  NA 140 139 140 137 138  K 4.6 4.7 4.3 4.6 4.1  CL 106 106 107 106 107  CO2 '26 26 23 24 24  '$ GLUCOSE 98 103* 108* 102* 101*  BUN 21 28* 36* 42* 33*  CREATININE 1.52* 1.90* 1.77* 1.73* 1.35*  CALCIUM 9.0 8.7* 8.9 8.8* 8.7*  MG  --  2.4  --   --   --   PHOS  --  4.4  --   --   --     GFR Estimated Creatinine Clearance: 43.6 mL/min (A) (by C-G formula based on SCr of 1.35 mg/dL (H)). Liver Function Tests: Recent Labs  Lab 02/06/22 1259 02/08/22 0024  AST 18 26  ALT 12 15  ALKPHOS 94 76  BILITOT 1.0 1.3*  PROT 7.2 6.1*  ALBUMIN 4.0 3.3*    Recent Labs  Lab 02/06/22 1259  LIPASE 55*    No results for input(s): "AMMONIA" in the last 168 hours. Coagulation profile Recent Labs  Lab 02/07/22 0320 02/08/22 0024 02/09/22 0024 02/10/22 0025 02/11/22 0026  INR 2.2* 2.7* 2.8* 3.2* 3.4*    COVID-19 Labs  No results for input(s): "DDIMER", "FERRITIN", "LDH", "CRP" in the last 72 hours.  Lab  Results  Component Value Date   [place] NEGATIVE 11/09/2021   [place]  NEGATIVE 02/12/2021    CBC: Recent Labs  Lab 02/06/22 1259 02/07/22 0320 02/08/22 0024 02/09/22 0024  WBC 10.3 7.6 8.0 8.5  NEUTROABS 7.9*  --   --  6.5  HGB 14.6 13.7 14.5 13.1  HCT 47.2 44.3 44.8 40.6  MCV 90.6 91.7 90.3 90.6  PLT 237 217 206 193    Cardiac Enzymes: No results for input(s): "CKTOTAL", "CKMB", "CKMBINDEX", "TROPONINI" in the last 168 hours. BNP (last 3 results) No results for input(s): "PROBNP" in the last 8760 hours. CBG: No results for input(s): "GLUCAP" in the last 168 hours. D-Dimer: No results for input(s): "DDIMER" in the last 72 hours. Hgb A1c: No results for input(s): "HGBA1C" in the last 72 hours. Lipid Profile: No results for input(s): "CHOL", "HDL", "LDLCALC", "TRIG", "CHOLHDL", "LDLDIRECT" in the last 72 hours. Thyroid function studies: No results for input(s): "TSH", "T4TOTAL", "T3FREE", "THYROIDAB" in the last 72 hours.  Invalid input(s): "FREET3" Anemia work up: No results for input(s): "VITAMINB12", "FOLATE", "FERRITIN", "TIBC", "IRON", "RETICCTPCT" in the last 72 hours. Sepsis Labs: Recent Labs  Lab 02/06/22 1259 02/07/22 0320 02/08/22 0024 02/09/22 0024 02/10/22 0025 02/11/22 0026  PROCALCITON  --   --   --  0.25 0.13 <0.10  WBC 10.3 7.6 8.0 8.5  --   --     Microbiology No results found for this or any previous visit (from the past 240 hour(s)).   Medications:    atorvastatin  20 mg Oral Daily   carvedilol  6.25 mg Oral BID WC   cilostazol  50 mg Oral BID   dextromethorphan-guaiFENesin  1 tablet Oral BID   ipratropium-albuterol  3 mL Nebulization BID   levothyroxine  150 mcg Oral QAC breakfast   lidocaine  1 patch Transdermal Q24H   sodium chloride flush  10 mL Intrapleural Q8H   Warfarin - Pharmacist Dosing Inpatient   Does not apply q1600   Continuous Infusions:  methocarbamol (ROBAXIN) IV        LOS: 4 days   Charlynne Cousins  [facility]  02/11/2022, 7:46 AM

## 2022-02-11 NOTE — Progress Notes (Signed)
ANTICOAGULATION CONSULT NOTE   Pharmacy Consult for warfarin Indication: Hx VTE/AF  Allergies  Allergen Reactions   Plavix [Clopidogrel Bisulfate]     Brain hemorrhage prev while on plavix and aspirin    Patient Measurements: Height: '5\' 9"'$  (175.3 cm) Weight: 73.9 kg (162 lb 15.8 oz) IBW/kg (Calculated) : 70.7   Vital Signs: Temp: 97.8 F (36.6 C) (10/20 0350) Temp Source: Oral (10/20 0350) BP: 124/48 (10/20 0350) Pulse Rate: 92 (10/20 0350)  Labs: Recent Labs    02/09/22 0024 02/10/22 0025 02/11/22 0026  HGB 13.1  --   --   HCT 40.6  --   --   PLT 193  --   --   LABPROT 29.6* 32.2* 34.2*  INR 2.8* 3.2* 3.4*  CREATININE 1.77* 1.73* 1.35*     Estimated Creatinine Clearance: 43.6 mL/min (A) (by C-G formula based on SCr of 1.35 mg/dL (H)).   Medical History: Past Medical History:  Diagnosis Date   (HFimpEF) heart failure with improved ejection fraction ([place])    a. 06/2011 Echo: EF 35-40%; b. 06/2012 Echo: EF 50%; c. 12/2016 Echo: EF 55-60%, no rwma, GRI DD, mild AS/MR, nl RV fxn, nl RVSP; d. 02/2021 Echo: EF 55%, GrII DD. Mild MR. Mild to mod TR. Sev AS by VTI.   AAA (abdominal aortic aneurysm) ([place])    a. 05/2020 Abd u/s: 3.6cm.   Aortic calcification (HCC) 05/01/2013   Bilateral renal artery stenosis ([place])    a. 06/2013 Angio/PTA: LRA: 95 (5x18 Herculink stent), RRA 60ost; b. 04/2021 Angio: mod RRA stenosis w/ patent LRA stent.   Carotid arterial disease ([place])    a. 05/2020 Carotid U/S: RICA 3-38%, LICA 25-05%   Coronary artery disease    a. 2013 s/p CABG x 3 (LIMA->LAD, VG->OM, VG->RPDA; b. 06/2012 MV: no ischemia; c. 02/2021 Cath: LM 100, RCA 90ost, 145m free LIMA->LAD nl, VG->OM2 nl, VG->RPDA nl. Mod AS (mean grad 139mg, AVA 1.1cm^2). RHC w/ elev filling pressures.   History of prior cigarette smoking 04/11/2008   Qualifier: Diagnosis of  By: BeDiona BrownerD, Amy     Hypertension    Hypothyroidism    Intraventricular hemorrhage (HCBrowning03/20/2014   Ischemic  cardiomyopathy    a. 06/2011 Echo: EF 35-40%; b. 06/2012 Echo: EF 50%; c. 12/2016 Echo: EF 55-60%, no rwma, GRI DD; d. 02/2021 Echo: Ef 55%, GrII DD.   Moderate aortic stenosis    a. 02/2021 Echo: EF 55%, GrII DD. Sev Ca2+ of AoV. Sev AS w/ AVA by VTI 0.79cm^2; b. 02/2021 Cath: Mod AS w/ mean grad 1563m. AVA 1.1cm^2.   Peripheral arterial disease ([place]CC[place]  a. Previous left lower extremity stenting by Dr. SanJamal Collinb. 12/2012 s/p bilat ostial common iliac stenting; c. 04/2021 Angio: Patent LRA stent, mod RRA stenosis. Small AAA. Sev plaque RSFA 2/ subtl occl of inf branch of profunda. Diff Ca2+ SFA dzs w/ collats from profunda and 3V runoff. Diffuse LSFA dzs w/ collats from profunda. Subtl PT dzs. 3V runoff-->Med Rx.   Right leg DVT (HCCPie Town4/21/2014   Subclavian artery stenosis, left (HCCFrancon[place]2/2014   Status post stenting of the ostium and self-expanding stent placement to the left axillary artery   Venous insufficiency     Assessment: [age]M presenting s/p fall - with pneumothorax and rib fx,  - hx VTE and pAfib on warfarin PTA.  Pharmacy consulted to restart home warfarin. OK to continue warfarin per surgery/IR  PTA dosing: 2.'5mg'$  daily except '5mg'$  MWF  INR  continues to trend up to 3.4 this morning after lower doses given last few nights  CBC has been stable.   Goal of Therapy:  INR 2-3 Monitor platelets by anticoagulation protocol: Yes   Plan:  Given trend up will hold warfarin tonight  Daily INR, s/s bleeding    Bonnita Nasuti Pharm.D. CPP, BCPS Clinical Pharmacist [phone] 02/11/2022 7:28 AM

## 2022-02-11 NOTE — Plan of Care (Signed)

## 2022-02-11 NOTE — Progress Notes (Addendum)
NAME:  [name], MRN:  [id number], DOB:  June 19, 1940, LOS: 4 ADMISSION DATE:  02/06/2022, CONSULTATION DATE:  10/18 REFERRING MD:  Venetia Constable, CHIEF COMPLAINT:  progressive hypoxia    History of Present Illness:  81 year old male w/ hx as outlined below. Admitted 10/15 s/p fall where he sustained multiple right sided rib fxs and Right sided PTX w/ fairly sig associate [place] emphysema.  Admitted by IM. Trauma consulted. Imaging reviewed w/ thoracic surg who recommended IR placement of tube given small size and concern about lung not completely re-inflating d/t prior CABG and potential for trapped lung.   A 14 fr CT guided CT was placed by IR on 10/15. Post CT placement showed Resolution of right PT but fairly sig [place] emphysema. Over the following days CT has been left to sxn, serial CXRs cont to show diffuse [place] air, elevated Right HD and no recurrence of PTX. During this time he has had continuous increased FIO2 needs now up to 15 l HFO. His INR has remained therapeutic (he is still on coumadin). PCCM has been asked to evaluate in effort to explain and assist w/ his rising O2 needs.  Pertinent  Medical History  Elevated right HD since his CABG 2014.  CAD w/ prior CABG, ICM, last known EF 55% , CKD stage IIIa, PAF on chronic coumadin, emphysema   Significant Hospital Events: Including procedures, antibiotic start and stop dates in addition to other pertinent events   10/15 admitted after fall c/b rib fxs and right apical PTX w/ diffuse [place] emphysema. CT placed by IR  10/15-10/18 resolution of ptx w/ CT to sxn, pn-going  emphysema; rising O2 needs up to 15 liters.  New onset AF w/ RVR 10/17.   Interim History / Subjective:   Resting comfortably.   Objective   Blood pressure 124/65, pulse 95, temperature 97.9 F (36.6 C), temperature source Oral, resp. rate 16, height '5\' 9"'$  (1.753 m), weight 73.9 kg, SpO2 93 %.        Intake/Output Summary (Last 24 hours) at 02/11/2022 1058 Last data filed at  02/11/2022 5427 Gross per 24 hour  Intake --  Output 1050 ml  Net -1050 ml   Filed Weights   02/06/22 1508  Weight: 73.9 kg    Examination: General:  elderly male, resting in bed.  HEENT: MM dry, NCAT Neuro: AAOX3 CV: S1 S2, RRR PULM: Diminished breath sounds GI: Soft NT ND  Extremities: warm dry no edema  Skin: no rash   Resolved Hospital Problem list     Assessment & Plan:  Principal Problem:   Acute respiratory failure (HCC) Active Problems:   Hypothyroidism   HYPERCHOLESTEROLEMIA   Abdominal aortic aneurysm (HCC)   COPD (chronic obstructive pulmonary disease) (HCC)   CAD (coronary artery disease), native coronary artery   Ischemic cardiomyopathy   Peripheral arterial disease (HCC)   HTN (hypertension)   CKD (chronic kidney disease) stage 3, GFR 30-59 ml/min (HCC)   DVT (deep venous thrombosis) (HCC)   Aortic valve stenosis   Rib fractures   Pneumothorax   Elevated diaphragm   Atelectasis   Pulmonary edema   Subcutaneous emphysema (HCC)   Right pulmonary contusion  Acute hypoxic respiratory failure.  Seemingly multifactorial.  I doubt this is pulmonary emboli as he has had therapeutic INR and has remained on warfarin. Baseline COPD. At this point highest on differential diagnosis would be pulmonary edema, atelectasis, PTX with subq air improved.  He has a chronically elevated right  hemidiaphragm. His extensive [place] emphysema makes 1 view imaging very difficult  RLL opacity vs. atelectasis COPD P: - weaned down to 2L [place]  - he will need duonebs at home - think with the chest trauma an inhaler will be difficult for him - I suspect his atelectasis will get better with mobility  - would like to have follow up in the [facility]  - will need to be walked   Pulmonary inpatient will sign off at this time.   Multiple rib fractures on the right with associated traumatic right pneumothorax status post chest tube placement 10/15, with radiographic  resolution of pneumothorax but ongoing subcutaneous emphysema P: Ct removed Subq air will take time to resolve.   Best Practice (right click and "Reselect all SmartList Selections" daily)  Per primary   Labs   CBC: Recent Labs  Lab 02/06/22 1259 02/07/22 0320 02/08/22 0024 02/09/22 0024  WBC 10.3 7.6 8.0 8.5  NEUTROABS 7.9*  --   --  6.5  HGB 14.6 13.7 14.5 13.1  HCT 47.2 44.3 44.8 40.6  MCV 90.6 91.7 90.3 90.6  PLT 237 217 206 517    Basic Metabolic Panel: Recent Labs  Lab 02/07/22 0320 02/08/22 0024 02/09/22 0024 02/10/22 0025 02/11/22 0026  NA 140 139 140 137 138  K 4.6 4.7 4.3 4.6 4.1  CL 106 106 107 106 107  CO2 '26 26 23 24 24  '$ GLUCOSE 98 103* 108* 102* 101*  BUN 21 28* 36* 42* 33*  CREATININE 1.52* 1.90* 1.77* 1.73* 1.35*  CALCIUM 9.0 8.7* 8.9 8.8* 8.7*  MG  --  2.4  --   --   --   PHOS  --  4.4  --   --   --    GFR: Estimated Creatinine Clearance: 43.6 mL/min (A) (by C-G formula based on SCr of 1.35 mg/dL (H)). Recent Labs  Lab 02/06/22 1259 02/07/22 0320 02/08/22 0024 02/09/22 0024 02/10/22 0025 02/11/22 0026  PROCALCITON  --   --   --  0.25 0.13 <0.10  WBC 10.3 7.6 8.0 8.5  --   --     Liver Function Tests: Recent Labs  Lab 02/06/22 1259 02/08/22 0024  AST 18 26  ALT 12 15  ALKPHOS 94 76  BILITOT 1.0 1.3*  PROT 7.2 6.1*  ALBUMIN 4.0 3.3*   Recent Labs  Lab 02/06/22 1259  LIPASE 55*   No results for input(s): "AMMONIA" in the last 168 hours.  ABG    Component Value Date/Time   PHART 7.33 (L) 02/09/2022 1725   PCO2ART 44 02/09/2022 1725   PO2ART 62 (L) 02/09/2022 1725   HCO3 23.2 02/09/2022 1725   TCO2 19 05/24/2013 1633   ACIDBASEDEF 2.8 (H) 02/09/2022 1725   O2SAT 93.6 02/09/2022 1725     Coagulation Profile: Recent Labs  Lab 02/07/22 0320 02/08/22 0024 02/09/22 0024 02/10/22 0025 02/11/22 0026  INR 2.2* 2.7* 2.8* 3.2* 3.4*    Cardiac Enzymes: No results for input(s): "CKTOTAL", "CKMB", "CKMBINDEX",  "TROPONINI" in the last 168 hours.  HbA1C: Hgb A1c MFr Bld  Date/Time Value Ref Range Status  07/05/2021 08:49 AM 5.5 4.6 - 6.5 % Final    Comment:    Glycemic Control Guidelines for People with Diabetes:Non Diabetic:  <6%Goal of Therapy: <7%Additional Action Suggested:  >8%   04/30/2020 11:57 AM 5.3 4.6 - 6.5 % Final    Comment:    Glycemic Control Guidelines for People with Diabetes:Non Diabetic:  <6%Goal of Therapy: <  7%Additional Action Suggested:  >8%     CBG: No results for input(s): "GLUCAP" in the last 168 hours.  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), Coronary artery disease, History of prior cigarette smoking (04/11/2008), Hypertension, Hypothyroidism, Intraventricular hemorrhage ([place]) (07/12/2012), Ischemic cardiomyopathy, Moderate aortic stenosis, Peripheral arterial disease ([place]), Right leg DVT ([place]) (08/13/2012), Subclavian artery stenosis, left ([place]) (05/2012), and Venous insufficiency.   Surgical History:   Past Surgical History:  Procedure Laterality Date   ABDOMINAL ANGIOGRAM  07/10/2013   WITH BI-FEMORAL RUNOFF       DR Fletcher Anon   ABDOMINAL AORTAGRAM N/A 12/26/2012   Procedure: ABDOMINAL Maxcine Ham;  Surgeon: Wellington Hampshire, MD;  Location: [place] CATH LAB;  Service: Cardiovascular;  Laterality: N/A;   ABDOMINAL AORTAGRAM N/A 07/10/2013   Procedure: ABDOMINAL Maxcine Ham;  Surgeon: Wellington Hampshire, MD;  Location: [place] CATH LAB;  Service: Cardiovascular;  Laterality: N/A;   ABDOMINAL AORTOGRAM W/LOWER EXTREMITY N/A 05/12/2021   Procedure: ABDOMINAL AORTOGRAM W/LOWER EXTREMITY;  Surgeon: Wellington Hampshire, MD;  Location: [place] CV LAB;  Service: Cardiovascular;  Laterality: N/A;   ANGIOPLASTY / STENTING FEMORAL  05/2012    ANGIOPLASTY / STENTING ILIAC Bilateral 12/26/2012   ARCH AORTOGRAM N/A 06/20/2012   Procedure: ARCH AORTOGRAM;  Surgeon: Wellington Hampshire, MD;  Location: [place] CATH LAB;  Service: Cardiovascular;  Laterality: N/A;   CARDIAC CATHETERIZATION  05/2012   [place]   CHOLECYSTECTOMY  1990's   CORONARY ARTERY BYPASS GRAFT  07/11/2011   Procedure: CORONARY ARTERY BYPASS GRAFTING (CABG);  Surgeon: Grace Isaac, MD;  Location: [place];  Service: Open Heart Surgery;  Laterality: N/A;  Times 3. On Pump. Using right greater saphenous vein and left internal mammary artery.    CORONARY ARTERY BYPASS GRAFT     HERNIA REPAIR     INSERTION OF ILIAC STENT Bilateral 12/26/2012   Procedure: INSERTION OF ILIAC STENT;  Surgeon: Wellington Hampshire, MD;  Location: [place] CATH LAB;  Service: Cardiovascular;  Laterality: Bilateral;  Bilateral Common Iliac Artery   LEFT AND RIGHT HEART CATHETERIZATION WITH CORONARY ANGIOGRAM  07/07/2011   Procedure: LEFT AND RIGHT HEART CATHETERIZATION WITH CORONARY ANGIOGRAM;  Surgeon: Minna Merritts, MD;  Location: [place] CATH LAB;  Service: Cardiovascular;;   LOWER EXTREMITY VENOGRAPHY Bilateral 12/30/2016   Procedure: Lower Extremity Venography;  Surgeon: Katha Cabal, MD;  Location: [place] CV LAB;  Service: Cardiovascular;  Laterality: Bilateral;   PERCUTANEOUS STENT INTERVENTION  06/20/2012   Procedure: PERCUTANEOUS STENT INTERVENTION;  Surgeon: Wellington Hampshire, MD;  Location: [place] CATH LAB;  Service: Cardiovascular;;   RENAL ARTERY ANGIOPLASTY Left 07/10/2013   DR ARIDA   RIGHT/LEFT HEART CATH AND CORONARY/GRAFT ANGIOGRAPHY N/A 03/15/2021   Procedure: RIGHT/LEFT HEART CATH AND CORONARY/GRAFT ANGIOGRAPHY;  Surgeon: Wellington Hampshire, MD;  Location: [place] CV LAB;  Service: Cardiovascular;  Laterality: N/A;   SUBCLAVIAN ARTERY STENT  05/2012   "2 stents" (12/26/2012)   TOOTH EXTRACTION  Spring 2017   mulitple (6)   VENA CAVA FILTER PLACEMENT  07/2012   Removable      Social History:   reports that he quit smoking about 11 years ago. His smoking use included cigarettes. He has a 60.00 pack-year smoking history. He has never used smokeless tobacco. He reports  that he does not drink alcohol and does not use drugs.   Family History:  His family history includes Alcohol abuse in his father; Cirrhosis in his father; Hypothyroidism in his sister.   Allergies Allergies  Allergen Reactions   Plavix [Clopidogrel Bisulfate]     Brain hemorrhage prev while on plavix and aspirin     Home Medications  Prior to Admission medications   Medication Sig Start Date End Date Taking? Authorizing Provider  amLODipine (NORVASC) 5 MG tablet TAKE 1 TABLET BY MOUTH EVERY DAY Patient taking differently: Take 5 mg by mouth daily. 01/12/22  Yes Wellington Hampshire, MD  atorvastatin (LIPITOR) 20 MG tablet TAKE 1 TABLET BY MOUTH EVERY DAY Patient taking differently: Take 20 mg by mouth daily. 12/24/21  Yes Wellington Hampshire, MD  carvedilol (COREG) 6.25 MG tablet TAKE 1 TABLET BY MOUTH TWICE A DAY WITH MEALS Patient taking differently: Take 6.25 mg by mouth 2 (two) times daily with a meal. 08/03/21  Yes Copland, Frederico Hamman, MD  cilostazol (PLETAL) 50 MG tablet TAKE 1 TABLET BY MOUTH TWICE A DAY Patient taking differently: Take 50 mg by mouth 2 (two) times daily. 08/03/21  Yes Wellington Hampshire, MD  furosemide (LASIX) 40 MG tablet Take 0.5 tablets (20 mg total) by mouth as directed. Take 20 mg once daily with extra 20 mg as needed for weight gain of 3 pounds overnight or 5 pounds in one week 01/19/22  Yes Theora Gianotti, NP  levothyroxine (SYNTHROID) 150 MCG tablet [place] BREAKFAST Patient taking differently: Take 150 mcg by mouth daily before breakfast. TAKE ONE TABLET BY MOUTH EVERY DAY BEFORE BREAKFAST 08/03/21  Yes Copland, Frederico Hamman, MD  mupirocin ointment (BACTROBAN) 2 % Apply 1 Application topically daily as needed (rash). 10/20/21  Yes  [provider]  triamcinolone ointment (KENALOG) 0.1 % APPLY TOPICALLY TO AFFECTED AREAS TWICE A DAY FOR 14 DAYS THEN MAY APPLY 5 DAYS WEEKLY TO AFFECTED AREAS Patient taking differently: Apply 1 Application topically daily as needed (rash). 12/21/21  Yes Ralene Bathe, MD  warfarin (COUMADIN) 5 MG tablet Take 0.5-1 tablets (2.5-5 mg total) by mouth See admin instructions. TAKE 1/2 TABLET DAILY BY MOUTH EXCEPT TAKE 1 TABLET ON MON, WED, FRI OR AS DIRECTED ANTICOAGULATION CLINIC 03/12/21  Yes Copland, Frederico Hamman, MD     Garner Nash, DO [facility] 02/11/2022 10:58 AM

## 2022-02-11 NOTE — Progress Notes (Signed)
Initial Nutrition Assessment  DOCUMENTATION CODES:   Not applicable  INTERVENTION:   Ensure Enlive po BID, each supplement provides 350 kcal and 20 grams of protein.  Magic cup TID with meals, each supplement provides 290 kcal and 9 grams of protein.  MVI with minerals po once daily.  Recommend scheduled bowel regimen as LBM documented 10/14.   NUTRITION DIAGNOSIS:   Increased nutrient needs related to acute illness (S/P fall with rib fractures, trauma) as evidenced by estimated needs.  GOAL:   Patient will meet greater than or equal to 90% of their needs  MONITOR:   PO intake, Supplement acceptance  REASON FOR ASSESSMENT:   Consult Assessment of nutrition requirement/status, Poor PO  ASSESSMENT:   81 yo male admitted S/P fall with pneumothorax, rib fractures, acute respiratory failure. PMH includes CAD, CABG, ischemic cardiomyopathy, CKD stage IIIa, PAF, HTN, CHF.  Chest tube placed 10/15, has been removed.  Patient is currently on a regular diet with 1500 ml fluid restriction. Meal intakes documented at 25-80% of breakfast and lunch today. Suspect difficulty breathing and pain r/t rib fractures is affecting patient's appetite. Constipation could be impacting intake as well (LBM documented 10/14). RD working remotely; unable to complete NFPE or speak with patient at this time. He would benefit from PO supplements to maximize provision of protein and calories. May also benefit from a scheduled bowel regimen.   Labs reviewed.  Medications reviewed and include Warfarin.  Weight history reviewed. Patient with 6% weight loss over the past 3 months, which is concerning, but not significant for the time frame.     NUTRITION - FOCUSED PHYSICAL EXAM:  Unable to complete  Diet Order:   Diet Order             Diet regular Room service appropriate? Yes; Fluid consistency: Thin; Fluid restriction: 1500 mL Fluid  Diet effective now                   EDUCATION NEEDS:    No education needs have been identified at this time  Skin:  Skin Assessment: Reviewed RN Assessment  Last BM:  10/14  Height:   Ht Readings from Last 1 Encounters:  02/06/22 '5\' 9"'$  (1.753 m)    Weight:   Wt Readings from Last 1 Encounters:  02/06/22 73.9 kg    Ideal Body Weight:  72.7 kg  BMI:  Body mass index is 24.07 kg/m.  Estimated Nutritional Needs:   Kcal:  1900-2100  Protein:  90-110 gm  Fluid:  1.9-2.1 L   Lucas Mallow RD, LDN, CNSC Please refer to Amion for contact information.

## 2022-02-12 DIAGNOSIS — S270XXD Traumatic pneumothorax, subsequent encounter: Secondary | ICD-10-CM

## 2022-02-12 DIAGNOSIS — I10 Essential (primary) hypertension: Secondary | ICD-10-CM | POA: Diagnosis not present

## 2022-02-12 DIAGNOSIS — N1831 Chronic kidney disease, stage 3a: Secondary | ICD-10-CM | POA: Diagnosis not present

## 2022-02-12 DIAGNOSIS — I255 Ischemic cardiomyopathy: Secondary | ICD-10-CM | POA: Diagnosis not present

## 2022-02-12 DIAGNOSIS — J9601 Acute respiratory failure with hypoxia: Secondary | ICD-10-CM | POA: Diagnosis not present

## 2022-02-12 LAB — CBC WITH DIFFERENTIAL/PLATELET
Abs Immature Granulocytes: 0.04 10*3/uL (ref 0.00–0.07)
Basophils Absolute: 0 10*3/uL (ref 0.0–0.1)
Basophils Relative: 1 %
Eosinophils Absolute: 0.3 10*3/uL (ref 0.0–0.5)
Eosinophils Relative: 4 %
HCT: 39.4 % (ref 39.0–52.0)
Hemoglobin: 12.5 g/dL — ABNORMAL LOW (ref 13.0–17.0)
Immature Granulocytes: 1 %
Lymphocytes Relative: 22 %
Lymphs Abs: 1.4 10*3/uL (ref 0.7–4.0)
MCH: 28.3 pg (ref 26.0–34.0)
MCHC: 31.7 g/dL (ref 30.0–36.0)
MCV: 89.3 fL (ref 80.0–100.0)
Monocytes Absolute: 0.6 10*3/uL (ref 0.1–1.0)
Monocytes Relative: 10 %
Neutro Abs: 4.1 10*3/uL (ref 1.7–7.7)
Neutrophils Relative %: 62 %
Platelets: 213 10*3/uL (ref 150–400)
RBC: 4.41 MIL/uL (ref 4.22–5.81)
RDW: 14.6 % (ref 11.5–15.5)
WBC: 6.5 10*3/uL (ref 4.0–10.5)
nRBC: 0 % (ref 0.0–0.2)

## 2022-02-12 LAB — PROTIME-INR
INR: 2.9 — ABNORMAL HIGH (ref 0.8–1.2)
Prothrombin Time: 29.8 seconds — ABNORMAL HIGH (ref 11.4–15.2)

## 2022-02-12 MED ORDER — FUROSEMIDE 40 MG PO TABS: 20.0000 mg | ORAL_TABLET | ORAL | 0 refills | Status: DC

## 2022-02-12 MED ORDER — WARFARIN SODIUM 2.5 MG PO TABS
2.5000 mg | ORAL_TABLET | Freq: Once | ORAL | Status: AC
  Administered 2022-02-12: 2.5 mg via ORAL
  Filled 2022-02-12: qty 1

## 2022-02-12 MED ORDER — SODIUM CHLORIDE 0.9 % IV SOLN: INTRAVENOUS | Status: AC

## 2022-02-12 MED ORDER — OXYCODONE HCL 5 MG PO TABS: 5.0000 mg | ORAL_TABLET | ORAL | 0 refills | Status: AC | PRN

## 2022-02-12 MED ORDER — PSYLLIUM 95 % PO PACK
1.0000 | PACK | Freq: Every day | ORAL | Status: DC
  Administered 2022-02-12: 1 via ORAL
  Filled 2022-02-12: qty 1

## 2022-02-12 MED ORDER — AMLODIPINE BESYLATE 5 MG PO TABS: 5.0000 mg | ORAL_TABLET | Freq: Every day | ORAL | 0 refills | Status: DC

## 2022-02-12 MED ORDER — PSYLLIUM 95 % PO PACK: 1.0000 | PACK | Freq: Every day | ORAL | 0 refills | Status: DC

## 2022-02-12 NOTE — Progress Notes (Signed)
Pt got discharged to home, discharge instructions provided and patient showed understanding to it, IV taken out,Telemonitor DC,pt left unit in wheelchair with all of the belongings accompanied with a family member (wife)  Lilliah Priego,RN 

## 2022-02-12 NOTE — TOC Transition Note (Signed)
Transition of Care [facility]) - CM/SW Discharge Note   Patient Details  Name: LACHARLES ALTSCHULER MRN: [id number] Date of Birth: [birth year]  Transition of Care [facility]) CM/SW Contact:  Konrad Penta, RN Phone Number: [phone] 02/12/2022, 10:48 AM   Clinical Narrative:   Mr. Galik slated to return home with spouse today. Met with patient and spouse at bedside. [place] for home health services. Spouse requests rolling walker. States prior RW was Mr. Miralles mother's from 10 years ago. Discussed home oxygen will be ordered and will be delivered to room prior to discharge.   Referral made to [facility] with [facility] for PT/OT/RN services. Tommi Rumps is able to accept referral.  Mardene Celeste with [employer] contacted for home oxygen and rolling walker delivery to room.  Please re-consult TOC if further needs arise      Final next level of care: Home w Home Health Services Barriers to Discharge: No Barriers Identified   Patient Goals and CMS Choice Patient states their goals for this hospitalization and ongoing recovery are:: return home CMS Medicare.gov Compare Post Acute Care list provided to:: Patient Represenative (must comment) (spouse) Choice offered to / list presented to : Spouse  Discharge Placement                       Discharge Plan and Services   Discharge Planning Services: CM Consult            DME Arranged: Gilford Rile rolling, Oxygen DME Agency: [employer] Date DME Agency Contacted: 02/12/22 Time DME Agency Contacted: [id number] Representative spoke with at DME Agency: Mardene Celeste HH Arranged: RN, PT, OT [facility] Agency: [place] Date [place]: 02/12/22 Time [place]: [id number] Representative spoke with at [place]: [place] Determinants of Health ([place]) Interventions     Readmission Risk Interventions     No data to display

## 2022-02-12 NOTE — Progress Notes (Signed)
Mobility Specialist Progress Note:   02/12/22 1016  Mobility  Activity Transferred to/from [facility]  Level of Assistance Standby assist, set-up cues, supervision of patient - no hands on  Assistive Device BSC  Distance Ambulated (ft) 2 ft  Activity Response Tolerated well  $Mobility charge 1 Mobility   Pt received in bed. Pt having issues with loose stool so asking to use BSC before hallway ambulation. Complaints of stomach feeling upset. Left on BSC and instructed to hit call button when finished. Will follow-up for hallway ambulation.   [facility] Surveyor, mining Chat only

## 2022-02-12 NOTE — Discharge Summary (Signed)
Physician Discharge Summary  [name] JXB:[id number] DOB: [birth year] DOA: 02/06/2022  PCP: Owens Loffler, MD  Admit date: 02/06/2022 Discharge date: 02/12/2022  Admitted From: Home Disposition:  Home  Recommendations for Outpatient Follow-up:  Follow up with PCP in 1-2 weeks Please obtain BMP/CBC in one week   Home Health:No Equipment/Devices:None  Discharge Condition:Stable CODE STATUS:Full Diet recommendation: Heart Healthy  Brief/Interim Summary:  82 y.o. male past medical history of CAD status post CABG, ischemic cardiomyopathy, chronic kidney disease stage IIIa with a baseline creatinine 1.5, paroxysmal atrial fibrillation on chronic Coumadin comes into the hospital after a fall found to have right fracture, pneumothorax and subcutaneous emphysema.  He initially presented to [facility] but was transferred to [facility] to the trauma service.  Did not had a syncopal event chest pain or shortness of breath.  Discharge Diagnoses:  Principal Problem:   Acute respiratory failure ([place]) Active Problems:   Pneumothorax   Elevated diaphragm   Atelectasis   Pulmonary edema   Rib fractures   Subcutaneous emphysema (HCC)   Abdominal aortic aneurysm (HCC)   CAD (coronary artery disease), native coronary artery   Ischemic cardiomyopathy   Peripheral arterial disease (HCC)   Aortic valve stenosis   DVT (deep venous thrombosis) (HCC)   HTN (hypertension)   CKD (chronic kidney disease) stage 3, GFR 30-59 ml/min (HCC)   HYPERCHOLESTEROLEMIA   COPD (chronic obstructive pulmonary disease) (HCC)   Hypothyroidism   Right pulmonary contusion  Pneumothorax with subcutaneous emphysema/acute respiratory failure with hypoxia secondary to fall in the setting of rib fracture probably leading to lung contusion: Trauma service was consulted chest tube was placed by IR. He was requiring high flow nasal cannula he went from 4 L to 15 L in less than 24-hour period. Pulmonary was consulted  who recommended a CT showed contusion work-up was unremarkable they recommended conservative management the patient slowly started to require less and less oxygen. On the day of discharge she is requiring 3 L of oxygen he will go home on 3 L for 2 weeks and be titrated as an outpatient.  Acute respiratory failure with hypoxia: Likely due to lung contusion, hypervolemia and underlying COPD. PCCM was consulted recommended conservative management he was continued on high flow nasal cannula 15 L for about 2 days then he was weaned down to 3 L but he continues an outpatient.  A-fib with RVR: Likely reactive, he was started on IV Cardizem converted to sinus rhythm continue on Coreg INR remained therapeutic. 2D echo was done that showed an EF of 13% no diastolic dysfunction appreciated.  Nondisplaced right rib fracture posterior along the seventh and eighth: Lidocaine patch working well it was treated with narcotics he was continue on incentive spirometry.  Coronary artery disease: Patient remained stable denies any chest pain that was exertional.  Ischemic cardiomyopathy: Stable noted.  Peripheral arterial disease: Continue Lipitor and Coumadin.  Moderate aortic stenosis: With a gradient of 16 valve area of 2.6 MS.  History of DVT: No changes made to his medication continue Coumadin.  Essential hypertension: Antihypertensive medications were held, they will be restarted slowly as an outpatient.  Acute kidney injury on chronic kidney disease stage III: Baseline creatinine 1.3-1.5 likely prerenal azotemia in the setting of hypotension that resolved with fluid resuscitation and withholding antihypertensive medication.  Hyperlipidemia: Culture Lipitor.  COPD: Noted, no signs of exacerbation.  Hypothyroidism: Continue Synthroid. Discharge Instructions  Discharge Instructions     Diet - low sodium heart healthy   Complete  by: As directed    Increase activity slowly   Complete by:  As directed    No wound care   Complete by: As directed       Allergies as of 02/12/2022       Reactions   Plavix [clopidogrel Bisulfate]    Brain hemorrhage prev while on plavix and aspirin        Medication List     TAKE these medications    amLODipine 5 MG tablet Commonly known as: NORVASC Take 1 tablet (5 mg total) by mouth daily. Start taking on: February 16, 2022 What changed: These instructions start on February 16, 2022. If you are unsure what to do until then, ask your doctor or other care provider.   atorvastatin 20 MG tablet Commonly known as: LIPITOR TAKE 1 TABLET BY MOUTH EVERY DAY   carvedilol 6.25 MG tablet Commonly known as: COREG TAKE 1 TABLET BY MOUTH TWICE A DAY WITH MEALS   cilostazol 50 MG tablet Commonly known as: PLETAL TAKE 1 TABLET BY MOUTH TWICE A DAY   furosemide 40 MG tablet Commonly known as: LASIX Take 0.5 tablets (20 mg total) by mouth as directed. Take 20 mg once daily with extra 20 mg as needed for weight gain of 3 pounds overnight or 5 pounds in one week Start taking on: February 13, 2022   levothyroxine 150 MCG tablet Commonly known as: SYNTHROID TAKE ONE TABLET BY MOUTH EVERY DAY BEFORE BREAKFAST What changed: See the new instructions.   mupirocin ointment 2 % Commonly known as: BACTROBAN Apply 1 Application topically daily as needed (rash).   oxyCODONE 5 MG immediate release tablet Commonly known as: Oxy IR/ROXICODONE Take 1-2 tablets (5-10 mg total) by mouth every 4 (four) hours as needed for up to 5 days for moderate pain.   psyllium 95 % Pack Commonly known as: HYDROCIL/METAMUCIL Take 1 packet by mouth daily.   triamcinolone ointment 0.1 % Commonly known as: KENALOG APPLY TOPICALLY TO AFFECTED AREAS TWICE A DAY FOR 14 DAYS THEN MAY APPLY 5 DAYS WEEKLY TO AFFECTED AREAS What changed: See the new instructions.   warfarin 5 MG tablet Commonly known as: COUMADIN Take as directed. If you are unsure how to take this  medication, talk to your nurse or doctor. Original instructions: Take 0.5-1 tablets (2.5-5 mg total) by mouth See admin instructions. TAKE 1/2 TABLET DAILY BY MOUTH EXCEPT TAKE 1 TABLET ON MON, WED, FRI OR AS DIRECTED ANTICOAGULATION CLINIC               Durable Medical Equipment  (From admission, onward)           Start     Ordered   02/12/22 1006  For home use only DME oxygen  Once       Question Answer Comment  Length of Need 6 Months   Mode or (Route) Nasal cannula   Liters per Minute 3   Frequency Continuous (stationary and portable oxygen unit needed)   Oxygen delivery system Gas      02/12/22 1005            Follow-up Information     [facility] Follow up on 02/22/2022.   Why: Please go get a chest x-ray to follow up from your collapsed lung.  Trauma clinic will call you with these results. Contact information: [place] [facility] Follow up today.   Why:  Call if you have questions regarding your follow up chest x-ray Contact information: [place] [id number] [phone]               Allergies  Allergen Reactions   Plavix [Clopidogrel Bisulfate]     Brain hemorrhage prev while on plavix and aspirin    Consultations: Trauma service Pulmonary and critical care.   Procedures/Studies: DG CHEST PORT 1 VIEW  Result Date: 02/10/2022 CLINICAL DATA:  Status post removal of right chest tube EXAM: PORTABLE CHEST 1 VIEW COMPARISON:  Previous studies including the examination done earlier today FINDINGS: There is interval removal of right chest tube. There is no pneumothorax. Linear patchy infiltrates are seen in lower lung fields, more so on the right side with interval worsening. There are no signs of alveolar pulmonary edema. There is minimal blunting of lateral CP angles. There is previous coronary artery bypass surgery.  There is interval decrease in extensive subcutaneous emphysema in chest wall and neck on both sides. There is vascular stent in the course of left subclavian vessels. IMPRESSION: Interval removal of right chest tube. There is no pneumothorax. There are linear patchy densities in both lower lung fields suggesting atelectasis/pneumonia with interval worsening. There is decrease in amount of extensive subcutaneous emphysema in chest wall and neck on both sides. Electronically Signed   By: Elmer Picker M.D.   On: 02/10/2022 10:40   DG CHEST PORT 1 VIEW  Result Date: 02/10/2022 CLINICAL DATA:  Right pneumothorax. EXAM: PORTABLE CHEST 1 VIEW COMPARISON:  February 09, 2022. FINDINGS: Status post coronary bypass graft. Stable cardiomediastinal silhouette. Stable extensive subcutaneous emphysema is noted bilaterally. Stable right-sided chest tube. No definite pneumothorax is noted. Probable mild bibasilar subsegmental atelectasis is noted. IMPRESSION: Stable extensive bilateral subcutaneous emphysema. Stable right-sided chest tube without definite pneumothorax. Stable bibasilar subsegmental atelectasis. Electronically Signed   By: Marijo Conception M.D.   On: 02/10/2022 08:13   CT CHEST WO CONTRAST  Result Date: 02/10/2022 CLINICAL DATA:  Respiratory illness with nondiagnostic x-ray EXAM: CT CHEST WITHOUT CONTRAST TECHNIQUE: Multidetector CT imaging of the chest was performed following the standard protocol without IV contrast. RADIATION DOSE REDUCTION: This exam was performed according to the departmental dose-optimization program which includes automated exposure control, adjustment of the mA and/or kV according to patient size and/or use of iterative reconstruction technique. COMPARISON:  Four days ago FINDINGS: Cardiovascular: Normal heart size. Extensive atheromatous calcification of the aorta and coronaries. Prior CABG. Mediastinum/Nodes: Pneumomediastinum which is moderate and new. No adenopathy or mass  Lungs/Pleura: Right lateral and lower chest tube. Resolved pneumothorax. Opacity with volume loss and consolidative features in the right lower lobe with small volume effusion. Centrilobular emphysema. Upper Abdomen: No acute finding or visible injury. Partially covered right renal cysts. Cholecystectomy. Extensive atherosclerosis. Musculoskeletal: Progressive chest wall emphysema which is diffuse. Known anterior right fifth rib fracture. Known right posterior 6 through 8 rib fractures. Extensive spondylosis with multi-level bridging. IMPRESSION: 1. New opacity in the right lower lobe, likely a combination of atelectasis and pneumonia. 2. Resolved right pneumothorax after chest tube placement. There is extensive and progressed chest wall emphysema with pneumomediastinum. 3. Aortic Atherosclerosis (ICD10-I70.0) and Emphysema (ICD10-J43.9). Electronically Signed   By: Jorje Guild M.D.   On: 02/10/2022 07:38   ECHOCARDIOGRAM COMPLETE  Result Date: 02/09/2022    ECHOCARDIOGRAM REPORT   Patient Name:   [name] Date of Exam: 02/09/2022 Medical Rec #:  [id number]  Height:       69.0 in Accession #:    [id number]     Weight:       163.0 lb Date of Birth:  07-06-1940       BSA:          1.894 m Patient Age:    54 years       BP:           91/51 mmHg Patient Gender: M              HR:           101 bpm. Exam Location:  Inpatient Procedure: 2D Echo, Cardiac Doppler and Color Doppler Indications:    R94.31 Abnormal EKG  History:        Patient has prior history of Echocardiogram examinations, most                 recent 02/26/2021. CAD, Abnormal ECG and Prior CABG, Pulmonary                 HTN, Arrythmias:Atrial Fibrillation, Signs/Symptoms:Edema; Risk                 Factors:Hypertension and Current Smoker. Pneumothorax.  Sonographer:    Roseanna Rainbow RDCS Referring Phys: [place]  Sonographer Comments: Technically difficult study due to poor echo windows, suboptimal parasternal window and no  subcostal window. Difficult study, patient supine. Pleural drain in place and paitent in pain. IMPRESSIONS  1. Left ventricular ejection fraction, by estimation, is 55 to 60%. The left ventricle has normal function. The left ventricle has no regional wall motion abnormalities. Left ventricular diastolic function could not be evaluated.  2. Right ventricular systolic function is normal. The right ventricular size is normal. There is mildly elevated pulmonary artery systolic pressure. The estimated right ventricular systolic pressure is 82.9 mmHg.  3. Left atrial size was mildly dilated.  4. Right atrial size was mildly dilated.  5. The mitral valve is normal in structure. Mild mitral valve regurgitation. No evidence of mitral stenosis.  6. The aortic valve is tricuspid. There is severe calcifcation of the aortic valve. Aortic valve regurgitation is not visualized. Mild to moderate aortic valve stenosis. Aortic valve mean gradient measures 16.5 mmHg. Aortic valve Vmax measures 2.66 m/s.  7. The inferior vena cava is normal in size with greater than 50% respiratory variability, suggesting right atrial pressure of 3 mmHg. FINDINGS  Left Ventricle: Left ventricular ejection fraction, by estimation, is 55 to 60%. The left ventricle has normal function. The left ventricle has no regional wall motion abnormalities. The left ventricular internal cavity size was normal in size. There is  no left ventricular hypertrophy. Left ventricular diastolic function could not be evaluated due to atrial fibrillation. Left ventricular diastolic function could not be evaluated. Right Ventricle: The right ventricular size is normal. No increase in right ventricular wall thickness. Right ventricular systolic function is normal. There is mildly elevated pulmonary artery systolic pressure. The tricuspid regurgitant velocity is 2.57  m/s, and with an assumed right atrial pressure of 8 mmHg, the estimated right ventricular systolic pressure is  56.2 mmHg. Left Atrium: Left atrial size was mildly dilated. Right Atrium: Right atrial size was mildly dilated. Pericardium: There is no evidence of pericardial effusion. Mitral Valve: The mitral valve is normal in structure. Mild mitral valve regurgitation. No evidence of mitral valve stenosis. Tricuspid Valve: The tricuspid valve is normal in structure. Tricuspid valve regurgitation is mild . No  evidence of tricuspid stenosis. Aortic Valve: The aortic valve is tricuspid. There is severe calcifcation of the aortic valve. Aortic valve regurgitation is not visualized. Mild to moderate aortic stenosis is present. Aortic valve mean gradient measures 16.5 mmHg. Aortic valve peak gradient measures 28.4 mmHg. Aortic valve area, by VTI measures 1.24 cm. Pulmonic Valve: The pulmonic valve was normal in structure. Pulmonic valve regurgitation is not visualized. No evidence of pulmonic stenosis. Aorta: The aortic root is normal in size and structure. Venous: The inferior vena cava is normal in size with greater than 50% respiratory variability, suggesting right atrial pressure of 3 mmHg. IAS/Shunts: No atrial level shunt detected by color flow Doppler.  LEFT VENTRICLE PLAX 2D LVIDd:         5.00 cm LVIDs:         4.00 cm LV PW:         1.30 cm LV IVS:        1.00 cm LVOT diam:     2.20 cm LV SV:         63 LV SV Index:   34 LVOT Area:     3.80 cm  LV Volumes (MOD) LV vol d, MOD A2C: 85.9 ml LV vol d, MOD A4C: 77.0 ml LV vol s, MOD A2C: 37.3 ml LV vol s, MOD A4C: 38.7 ml LV SV MOD A2C:     48.6 ml LV SV MOD A4C:     77.0 ml LV SV MOD BP:      43.7 ml RIGHT VENTRICLE RV S prime:     6.09 cm/s TAPSE (M-mode): 0.7 cm LEFT ATRIUM             Index        RIGHT ATRIUM           Index LA diam:        3.90 cm 2.06 cm/m   RA Area:     17.40 cm LA Vol (A2C):   45.5 ml 24.02 ml/m  RA Volume:   47.60 ml  25.13 ml/m LA Vol (A4C):   40.5 ml 21.38 ml/m LA Biplane Vol: 43.6 ml 23.02 ml/m  AORTIC VALVE AV Area (Vmax):    1.27 cm AV  Area (Vmean):   1.19 cm AV Area (VTI):     1.24 cm AV Vmax:           266.50 cm/s AV Vmean:          191.500 cm/s AV VTI:            0.513 m AV Peak Grad:      28.4 mmHg AV Mean Grad:      16.5 mmHg LVOT Vmax:         89.10 cm/s LVOT Vmean:        59.700 cm/s LVOT VTI:          0.167 m LVOT/AV VTI ratio: 0.33  AORTA Ao Root diam: 3.00 cm MITRAL VALVE                TRICUSPID VALVE MV Area (PHT): 4.58 cm     TR Peak grad:   26.4 mmHg MV Decel Time: 166 msec     TR Vmax:        257.00 cm/s MV E velocity: 114.50 cm/s                             SHUNTS  Systemic VTI:  0.17 m                             Systemic Diam: 2.20 cm Glori Bickers MD Electronically signed by Glori Bickers MD Signature Date/Time: 02/09/2022/4:11:44 PM    Final    DG CHEST PORT 1 VIEW  Result Date: 02/09/2022 CLINICAL DATA:  Pneumothorax EXAM: PORTABLE CHEST 1 VIEW COMPARISON:  Chest x-ray dated February 08, 2022 FINDINGS: Cardiac and mediastinal contours are unchanged post median sternotomy and CABG. Right-sided chest tube in place. Visible pneumothorax. Slightly decreased pneumomediastinum. Similar diffuse subcutaneous air. No large pleural effusion. IMPRESSION: No visible pneumothorax.  Right-sided chest tube in place. Electronically Signed   By: Yetta Glassman M.D.   On: 02/09/2022 08:25   DG CHEST PORT 1 VIEW  Result Date: 02/08/2022 CLINICAL DATA:  Right-sided pneumothorax EXAM: PORTABLE CHEST 1 VIEW COMPARISON:  Chest x-ray dated February 07, 2022 FINDINGS: Unchanged position of right-sided chest tube. Cardiac and mediastinal contours are unchanged post median sternotomy and CABG. Bibasilar atelectasis. Unchanged mild pneumomediastinum and diffuse soft tissue emphysema. No visible pneumothorax. No large pleural effusion. IMPRESSION: 1. Unchanged position of right-sided chest tube. No visible pneumothorax. 2. Unchanged mild pneumomediastinum and diffuse soft tissue emphysema. Electronically  Signed   By: Yetta Glassman M.D.   On: 02/08/2022 08:20   DG CHEST PORT 1 VIEW  Result Date: 02/07/2022 CLINICAL DATA:  Pneumothorax tracks. EXAM: PORTABLE CHEST 1 VIEW COMPARISON:  AP chest 02/07/2022 at 8:07 a.m., AP chest 02/06/2022 (multiple studies) FINDINGS: Single frontal view of the chest 02/07/2022 at 6:08 p.m. Small bore right pigtail catheter again projects over the inferior right lung. Within the limitations of high-grade subcutaneous air throughout the bilateral chest walls and neck, no definitive right pneumothorax is visualized, however it is difficult to exclude a tiny right apical pneumothorax. These findings appear not significantly changed from 02/07/2022 07 a.m. earlier today. Status post median sternotomy and CABG. Vascular stent overlies the proximal left subclavian artery. Mild high-grade calcification within the aortic arch. Cardiac silhouette is mildly to moderately enlarged. Mildly decreased lung volumes. Likely bibasilar bronchovascular crowding, similar to prior. No acute skeletal abnormality. IMPRESSION: 1. No significant change from 02/07/2022 at 8:07 AM. 2. Small bore right pigtail catheter remains in place. No definitive right pneumothorax is visualized. 3. Similar bibasilar atelectasis. Electronically Signed   By: Yvonne Kendall M.D.   On: 02/07/2022 18:58   DG Chest Port 1 View  Result Date: 02/07/2022 CLINICAL DATA:  81 year old male with history of pneumothorax. Chest tube. EXAM: PORTABLE CHEST 1 VIEW COMPARISON:  Chest x-ray 02/06/2022. FINDINGS: Small bore chest tube with pigtail reformed projecting over the lower right hemithorax. Previously noted right-sided pneumothorax is no longer readily apparent. Lung volumes are low. Bibasilar opacities favored to reflect areas of subsegmental atelectasis. No consolidative airspace disease. No definite pleural effusions. No evidence of pulmonary edema. Heart size is normal. Upper mediastinal contours are within normal limits.  Atherosclerotic calcifications are noted in the thoracic aorta. Status post median sternotomy for CABG. Extensive subcutaneous and intramuscular gas noted throughout the thorax bilaterally tracking cephalad into the cervical regions bilaterally. IMPRESSION: 1. Resolution of previously noted right-sided pneumothorax following right-sided chest tube placement. 2. Low lung volumes with bibasilar subsegmental atelectasis. 3. Aortic atherosclerosis. Electronically Signed   By: Vinnie Langton M.D.   On: 02/07/2022 08:15   CT [facility] PLEURAL DRAIN W/INDWELL CATH W/IMG GUIDE  Result Date: 02/06/2022 INDICATION: 81 year old  male with history of multifocal mildly displaced rib fractures complicated by pneumothorax and diffuse thoracic and head and neck subcutaneous emphysema. EXAM: CT PERC PLEURAL DRAIN W/INDWELL CATH W/IMG GUIDE COMPARISON:  None Available. MEDICATIONS: The patient is currently admitted to the hospital and receiving intravenous antibiotics. The antibiotics were administered within an appropriate time frame prior to the initiation of the procedure. ANESTHESIA/SEDATION: Moderate (conscious) sedation was employed during this procedure. A total of Versed 1.5 mg and Fentanyl 50 mcg was administered intravenously. Moderate Sedation Time: 20 minutes. The patient's level of consciousness and vital signs were monitored continuously by radiology nursing throughout the procedure under my direct supervision. CONTRAST:  None COMPLICATIONS: None immediate. PROCEDURE: RADIATION DOSE REDUCTION: This exam was performed according to the departmental dose-optimization program which includes automated exposure control, adjustment of the mA and/or kV according to patient size and/or use of iterative reconstruction technique. Informed written consent was obtained from the patient after a discussion of the risks, benefits and alternatives to treatment. The patient was placed supine, partially left lateral decubitus on the CT  gantry and a pre procedural CT was performed re-demonstrating the known right pneumothorax. The procedure was planned. A timeout was performed prior to the initiation of the procedure. The right posterolateral and inferior thorax was prepped and draped in the usual sterile fashion. The overlying soft tissues were anesthetized with 1% lidocaine with epinephrine. Appropriate trajectory was planned with the use of a 22 gauge spinal needle. An 18 gauge trocar needle was advanced into the pleural space and a short Amplatz super stiff wire was coiled within the pleural space. Appropriate positioning was confirmed with a limited CT scan. The tract was serially dilated allowing placement of a 14 French all-purpose drainage catheter. The tube was connected to a pleurovac and sutured in place. Limited postprocedure chest CT demonstrated complete resolution of the pneumothorax. There was significant persistent head, neck, and thoracic subcutaneous emphysema. A dressing was placed. The patient tolerated the procedure well without immediate post procedural complication. IMPRESSION: Successful CT guided placement of a 33 French all purpose drain catheter into the anterior right pleural space with complete evacuation of right pneumothorax. Ruthann Cancer, MD [facility] Electronically Signed   By: Ruthann Cancer M.D.   On: 02/06/2022 22:45   DG Chest Portable 1 View  Result Date: 02/06/2022 CLINICAL DATA:  Pneumothorax EXAM: PORTABLE CHEST 1 VIEW COMPARISON:  Chest x-ray 02/06/2022.  CT of the chest 02/06/2022. FINDINGS: Small right pneumothorax is mildly increased from the prior x-ray no measuring 2 cm the lung apex. There is no mediastinal shift. Extensive chest wall emphysema is present in has increased now extending over the left chest and neck. There is no pleural effusion or lung consolidation. There is minimal bibasilar atelectasis, unchanged. Cardiomediastinal  silhouette is within normal limits. Patient is status post cardiac surgery. Osseous structures are unchanged. IMPRESSION: 1. Small right pneumothorax is mildly increased from the prior x-ray. 2. Extensive chest wall emphysema has increased now extending over the left chest and neck. Electronically Signed   By: Ronney Asters M.D.   On: 02/06/2022 15:25   CT ABDOMEN PELVIS W CONTRAST  Result Date: 02/06/2022 CLINICAL DATA:  Fall.  Patient fell onto a hardwood floor. EXAM: CT ABDOMEN AND PELVIS WITH CONTRAST TECHNIQUE: Multidetector CT imaging of the abdomen and pelvis was performed using the standard protocol following bolus administration of intravenous contrast. RADIATION DOSE REDUCTION: This exam was performed according to the departmental dose-optimization program which  includes automated exposure control, adjustment of the mA and/or kV according to patient size and/or use of iterative reconstruction technique. CONTRAST:  138m OMNIPAQUE IOHEXOL 300 MG/ML  SOLN COMPARISON:  Current chest CT. FINDINGS: Lower chest: Right pneumothorax. Dependent right lung atelectasis. Extensive right chest subcutaneous emphysema that extends to the right abdomen, as described under the current chest CT. Hepatobiliary: Liver normal in size and attenuation. No laceration contusion. No mass. Status post cholecystectomy. No bile duct dilation. Pancreas: Normal. Spleen: No contusion or laceration.  Normal in size.  No mass. Adrenals/Urinary Tract: No adrenal mass or hemorrhage. Small left kidney with diffuse cortical thinning. Two right renal cysts, largest extending from the anterior midpole, 8.2 cm in size. Tiny low-attenuation lesions along the lower pole right kidney, also consistent with cysts. No follow-up recommended. No stones. No hydronephrosis. Normal ureters. Normal bladder. Stomach/Bowel: No stomach, bowel or mesenteric injury. Stomach is unremarkable. Small bowel and colon are normal in caliber. No wall thickening or  inflammation. Scattered left colon diverticula. Portion of the colon, at the junction of the descending and sigmoid colon, enters a left inguinal hernia without evidence obstruction, incarceration or strangulation. Vascular/Lymphatic: Irregular, overall fusiform, infrarenal abdominal aortic aneurysm, 4.5 cm anterior-posterior, 9 cm in length. No aneurysm rupture. No dissection. Well-positioned inferior vena cava filter. No enlarged lymph nodes. Reproductive: Mild prostate enlargement, 4.6 x 4.4 cm. Other: No ascites or hemoperitoneum. Musculoskeletal: Right rib fractures as described under the current chest CT. No other fractures.  No bone lesions. IMPRESSION: 1. No acute findings within the abdomen or pelvis. Specifically, no evidence injury the abdomen or pelvis. 2. Right pneumothorax, dependent right lung atelectasis and large amount right chest wall and right abdominal subcutaneous emphysema, associated with right rib fractures, as described under the current chest CT. 3. 4.5 cm infrarenal abdominal aortic aneurysm. No evidence of rupture. Recommend follow-up CT/MR every 6 months and vascular consultation. This recommendation follows ACR consensus guidelines: White Paper of the ACR Incidental Findings Committee II on Vascular Findings. J Am Coll Radiol 2013; 10:789-794. 4. Left inguinal hernia containing a small portion of the colon at the junction of the descending and sigmoid, without evidence of obstruction, incarceration or strangulation. Electronically Signed   By: DLajean ManesM.D.   On: 02/06/2022 14:12   DG Chest Portable 1 View  Result Date: 02/06/2022 CLINICAL DATA:  Fall with chest pain. EXAM: PORTABLE CHEST 1 VIEW COMPARISON:  11/09/2021 FINDINGS: A small RIGHT apical pneumothorax is noted (less than 10%. Extensive RIGHT subcutaneous emphysema is present. A fracture of the RIGHT 5th rib is present. The cardiomediastinal silhouette is unremarkable. CABG changes and LEFT subclavian stent again  noted. The RIGHT lung is clear. No pleural effusion identified. IMPRESSION: 1. Small RIGHT apical pneumothorax (less than 10%) with extensive RIGHT subcutaneous emphysema. 2. RIGHT 5th rib fracture. Critical Value/emergent results were called by telephone at the time of interpretation on 02/06/2022 at 1:39 pm to [facility], who verbally acknowledged these results. Electronically Signed   By: JMargarette CanadaM.D.   On: 02/06/2022 13:40   CT CHEST WO CONTRAST  Result Date: 02/06/2022 CLINICAL DATA:  Fall on right side of chest EXAM: CT CHEST WITHOUT CONTRAST TECHNIQUE: Multidetector CT imaging of the chest was performed following the standard protocol without IV contrast. RADIATION DOSE REDUCTION: This exam was performed according to the departmental dose-optimization program which includes automated exposure control, adjustment of the mA and/or kV according to patient size and/or use of iterative reconstruction technique. COMPARISON:  11/09/2021 FINDINGS: Cardiovascular: Aortic atherosclerosis. Dense aortic valve calcifications. Normal heart size. Three-vessel coronary artery calcifications status post median sternotomy and CABG. No pericardial effusion. Mediastinum/Nodes: No enlarged mediastinal, hilar, or axillary lymph nodes. Thyroid gland, trachea, and esophagus demonstrate no significant findings. Lungs/Pleura: Moderate to severe centrilobular emphysema. Diffuse bilateral bronchial wall thickening. Small volume, approximately 10% right pneumothorax. Upper Abdomen: No acute abnormality. Musculoskeletal: Nondisplaced fractures of the lateral right fifth and posterior right seventh and eighth ribs. Subcutaneous emphysema about the right chest wall. Disc degenerative disease and bridging osteophytosis throughout the thoracic spine, in keeping with DISH. IMPRESSION: 1. Small volume, approximately 10% right pneumothorax. 2. Nondisplaced fractures of the overlying lateral right fifth and posterior right  seventh and eighth ribs. 3. Subcutaneous emphysema about the right chest wall. 4. Emphysema and diffuse bilateral bronchial wall thickening. 5. Coronary artery disease. 6. Dense aortic valve calcifications. Correlate for echocardiographic evidence of aortic valve dysfunction when clinically appropriate. These results were called by telephone at the time of interpretation on 02/06/2022 at 12:38 pm to Dr. Jori Moll, who verbally acknowledged these results. Aortic Atherosclerosis (ICD10-I70.0) and Emphysema (ICD10-J43.9). Electronically Signed   By: Delanna Ahmadi M.D.   On: 02/06/2022 12:46   CT Head Wo Contrast  Result Date: 02/06/2022 CLINICAL DATA:  Patient fell onto the right side.  Chest wall pain. EXAM: CT HEAD WITHOUT CONTRAST CT CERVICAL SPINE WITHOUT CONTRAST TECHNIQUE: Multidetector CT imaging of the head and cervical spine was performed following the standard protocol without intravenous contrast. Multiplanar CT image reconstructions of the cervical spine were also generated. RADIATION DOSE REDUCTION: This exam was performed according to the departmental dose-optimization program which includes automated exposure control, adjustment of the mA and/or kV according to patient size and/or use of iterative reconstruction technique. COMPARISON:  12/22/2016. FINDINGS: CT HEAD FINDINGS Brain: No evidence of acute infarction, hemorrhage, hydrocephalus, extra-axial collection or mass lesion/mass effect. Small old deep white matter lacunar infarcts, right frontal lobe. Mild chronic microvascular ischemic change. Small old infarct suggested in the inferior right cerebellum. These findings are stable. Vascular: No hyperdense vessel or unexpected calcification. Skull: Normal. Negative for fracture or focal lesion. Sinuses/Orbits: Globes and orbits are unremarkable. Sinuses are clear. Other: None. CT CERVICAL SPINE FINDINGS Alignment: Mild kyphosis, apex at C3-C4.  No spondylolisthesis. Skull base and vertebrae: No acute  fracture. No primary bone lesion or focal pathologic process. Soft tissues and spinal canal: No prevertebral fluid or swelling. No visible canal hematoma. Disc levels: Mild loss of disc height at C2-C3. Moderate loss of disc height from C3-C4 through C6-C7. Disc bulging and endplate spurring noted at these levels. Facet degenerative changes, most evident on the right at C3-C4 and on the left at C2-C3. No convincing disc herniation. Upper chest: Subcutaneous air at the right neck base. Please refer to the patient's current CT chest for further details. Other: None. IMPRESSION: HEAD CT 1. No acute intracranial abnormalities. CERVICAL CT 1. No fracture. 2. Right neck base soft tissue air. Refer to the current chest CT for further details. Electronically Signed   By: Lajean Manes M.D.   On: 02/06/2022 12:39   CT Cervical Spine Wo Contrast  Result Date: 02/06/2022 CLINICAL DATA:  Patient fell onto the right side.  Chest wall pain. EXAM: CT HEAD WITHOUT CONTRAST CT CERVICAL SPINE WITHOUT CONTRAST TECHNIQUE: Multidetector CT imaging of the head and cervical spine was performed following the standard protocol without intravenous contrast. Multiplanar CT image reconstructions of the cervical spine were also generated. RADIATION DOSE REDUCTION:  This exam was performed according to the departmental dose-optimization program which includes automated exposure control, adjustment of the mA and/or kV according to patient size and/or use of iterative reconstruction technique. COMPARISON:  12/22/2016. FINDINGS: CT HEAD FINDINGS Brain: No evidence of acute infarction, hemorrhage, hydrocephalus, extra-axial collection or mass lesion/mass effect. Small old deep white matter lacunar infarcts, right frontal lobe. Mild chronic microvascular ischemic change. Small old infarct suggested in the inferior right cerebellum. These findings are stable. Vascular: No hyperdense vessel or unexpected calcification. Skull: Normal. Negative for  fracture or focal lesion. Sinuses/Orbits: Globes and orbits are unremarkable. Sinuses are clear. Other: None. CT CERVICAL SPINE FINDINGS Alignment: Mild kyphosis, apex at C3-C4.  No spondylolisthesis. Skull base and vertebrae: No acute fracture. No primary bone lesion or focal pathologic process. Soft tissues and spinal canal: No prevertebral fluid or swelling. No visible canal hematoma. Disc levels: Mild loss of disc height at C2-C3. Moderate loss of disc height from C3-C4 through C6-C7. Disc bulging and endplate spurring noted at these levels. Facet degenerative changes, most evident on the right at C3-C4 and on the left at C2-C3. No convincing disc herniation. Upper chest: Subcutaneous air at the right neck base. Please refer to the patient's current CT chest for further details. Other: None. IMPRESSION: HEAD CT 1. No acute intracranial abnormalities. CERVICAL CT 1. No fracture. 2. Right neck base soft tissue air. Refer to the current chest CT for further details. Electronically Signed   By: Lajean Manes M.D.   On: 02/06/2022 12:39   (Echo, Carotid, EGD, Colonoscopy, ERCP)    Subjective: No complaints  Discharge Exam: Vitals:   02/12/22 0736 02/12/22 0747  BP: 115/79   Pulse: 88   Resp: 20   Temp: 97.6 F (36.4 C)   SpO2: 95% 94%   Vitals:   02/11/22 2355 02/12/22 0340 02/12/22 0736 02/12/22 0747  BP: (!) 108/53 (!) 100/53 115/79   Pulse:   88   Resp: 19  20   Temp: 97.9 F (36.6 C) (!) 97.5 F (36.4 C) 97.6 F (36.4 C)   TempSrc: Oral Oral Oral   SpO2:   95% 94%  Weight:      Height:        General: Pt is alert, awake, not in acute distress Cardiovascular: RRR, S1/S2 +, no rubs, no gallops Respiratory: CTA bilaterally, no wheezing, no rhonchi Abdominal: Soft, NT, ND, bowel sounds + Extremities: no edema, no cyanosis    The results of significant diagnostics from this hospitalization (including imaging, microbiology, ancillary and laboratory) are listed below for  reference.     Microbiology: No results found for this or any previous visit (from the past 240 hour(s)).   Labs: BNP (last 3 results) Recent Labs    02/12/21 1701 11/09/21 0941 02/09/22 0024  BNP 632.7* 271.8* 854.6*   Basic Metabolic Panel: Recent Labs  Lab 02/07/22 0320 02/08/22 0024 02/09/22 0024 02/10/22 0025 02/11/22 0026  NA 140 139 140 137 138  K 4.6 4.7 4.3 4.6 4.1  CL 106 106 107 106 107  CO2 '26 26 23 24 24  '$ GLUCOSE 98 103* 108* 102* 101*  BUN 21 28* 36* 42* 33*  CREATININE 1.52* 1.90* 1.77* 1.73* 1.35*  CALCIUM 9.0 8.7* 8.9 8.8* 8.7*  MG  --  2.4  --   --   --   PHOS  --  4.4  --   --   --    Liver Function Tests: Recent Labs  Lab 02/06/22 1259  02/08/22 0024  AST 18 26  ALT 12 15  ALKPHOS 94 76  BILITOT 1.0 1.3*  PROT 7.2 6.1*  ALBUMIN 4.0 3.3*   Recent Labs  Lab 02/06/22 1259  LIPASE 55*   No results for input(s): "AMMONIA" in the last 168 hours. CBC: Recent Labs  Lab 02/06/22 1259 02/07/22 0320 02/08/22 0024 02/09/22 0024  WBC 10.3 7.6 8.0 8.5  NEUTROABS 7.9*  --   --  6.5  HGB 14.6 13.7 14.5 13.1  HCT 47.2 44.3 44.8 40.6  MCV 90.6 91.7 90.3 90.6  PLT 237 217 206 193   Cardiac Enzymes: No results for input(s): "CKTOTAL", "CKMB", "CKMBINDEX", "TROPONINI" in the last 168 hours. BNP: Invalid input(s): "POCBNP" CBG: No results for input(s): "GLUCAP" in the last 168 hours. D-Dimer No results for input(s): "DDIMER" in the last 72 hours. Hgb A1c No results for input(s): "HGBA1C" in the last 72 hours. Lipid Profile No results for input(s): "CHOL", "HDL", "LDLCALC", "TRIG", "CHOLHDL", "LDLDIRECT" in the last 72 hours. Thyroid function studies No results for input(s): "TSH", "T4TOTAL", "T3FREE", "THYROIDAB" in the last 72 hours.  Invalid input(s): "FREET3" Anemia work up No results for input(s): "VITAMINB12", "FOLATE", "FERRITIN", "TIBC", "IRON", "RETICCTPCT" in the last 72 hours. Urinalysis    Component Value Date/Time    COLORURINE YELLOW 03/24/2021 1547   APPEARANCEUR CLEAR 03/24/2021 1547   LABSPEC 1.010 03/24/2021 1547   PHURINE 6.5 03/24/2021 1547   GLUCOSEU NEGATIVE 03/24/2021 1547   HGBUR NEGATIVE 03/24/2021 1547   BILIRUBINUR NEGATIVE 03/24/2021 1547   KETONESUR NEGATIVE 03/24/2021 1547   PROTEINUR NEGATIVE 12/22/2016 1121   UROBILINOGEN 0.2 03/24/2021 1547   NITRITE NEGATIVE 03/24/2021 1547   LEUKOCYTESUR NEGATIVE 03/24/2021 1547   Sepsis Labs Recent Labs  Lab 02/06/22 1259 02/07/22 0320 02/08/22 0024 02/09/22 0024  WBC 10.3 7.6 8.0 8.5   Microbiology No results found for this or any previous visit (from the past 240 hour(s)).   SIGNED:   Charlynne Cousins, MD  [facility] 02/12/2022, 10:11 AM Pager   If 7PM-7AM, please contact night-coverage [email or web address] Password [email or web address]

## 2022-02-12 NOTE — Progress Notes (Signed)
ANTICOAGULATION CONSULT NOTE   Pharmacy Consult for warfarin Indication: Hx VTE/AF  Allergies  Allergen Reactions   Plavix [Clopidogrel Bisulfate]     Brain hemorrhage prev while on plavix and aspirin    Patient Measurements: Height: '5\' 9"'$  (175.3 cm) Weight: 73.9 kg (162 lb 15.8 oz) IBW/kg (Calculated) : 70.7   Vital Signs: Temp: 97.5 F (36.4 C) (10/21 0340) Temp Source: Oral (10/21 0340) BP: 100/53 (10/21 0340)  Labs: Recent Labs    02/10/22 0025 02/11/22 0026 02/12/22 0033  LABPROT 32.2* 34.2* 29.8*  INR 3.2* 3.4* 2.9*  CREATININE 1.73* 1.35*  --      Estimated Creatinine Clearance: 43.6 mL/min (A) (by C-G formula based on SCr of 1.35 mg/dL (H)).   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 06/2011 Echo: EF 35-40%; b. 06/2012 Echo: EF 50%; c. 12/2016 Echo: EF 55-60%, no rwma, GRI DD, mild AS/MR, nl RV fxn, nl RVSP; d. 02/2021 Echo: EF 55%, GrII DD. Mild MR. Mild to mod TR. Sev AS by VTI.   AAA (abdominal aortic aneurysm) ([place])    a. 05/2020 Abd u/s: 3.6cm.   Aortic calcification (HCC) 05/01/2013   Bilateral renal artery stenosis ([place])    a. 06/2013 Angio/PTA: LRA: 95 (5x18 Herculink stent), RRA 60ost; b. 04/2021 Angio: mod RRA stenosis w/ patent LRA stent.   Carotid arterial disease ([place])    a. 05/2020 Carotid U/S: RICA 5-36%, LICA 14-43%   Coronary artery disease    a. 2013 s/p CABG x 3 (LIMA->LAD, VG->OM, VG->RPDA; b. 06/2012 MV: no ischemia; c. 02/2021 Cath: LM 100, RCA 90ost, 1105m free LIMA->LAD nl, VG->OM2 nl, VG->RPDA nl. Mod AS (mean grad 110mg, AVA 1.1cm^2). RHC w/ elev filling pressures.   History of prior cigarette smoking 04/11/2008   Qualifier: Diagnosis of  By: BeDiona BrownerD, Amy     Hypertension    Hypothyroidism    Intraventricular hemorrhage ([facility]/20/2014   Ischemic cardiomyopathy    a. 06/2011 Echo: EF 35-40%; b. 06/2012 Echo: EF 50%; c. 12/2016 Echo: EF 55-60%, no  rwma, GRI DD; d. 02/2021 Echo: Ef 55%, GrII DD.   Moderate aortic stenosis    a. 02/2021 Echo: EF 55%, GrII DD. Sev Ca2+ of AoV. Sev AS w/ AVA by VTI 0.79cm^2; b. 02/2021 Cath: Mod AS w/ mean grad 1590m. AVA 1.1cm^2.   Peripheral arterial disease (HCCAyr  a. Previous left lower extremity stenting by Dr. SanJamal Collinb. 12/2012 s/p bilat ostial common iliac stenting; c. 04/2021 Angio: Patent LRA stent, mod RRA stenosis. Small AAA. Sev plaque RSFA 2/ subtl occl of inf branch of profunda. Diff Ca2+ SFA dzs w/ collats from profunda and 3V runoff. Diffuse LSFA dzs w/ collats from profunda. Subtl PT dzs. 3V runoff-->Med Rx.   Right leg DVT ([facility] [place]4/21/2014   Subclavian artery stenosis, left ([facility]2/2014   Status post stenting of the ostium and self-expanding stent placement to the left axillary artery   Venous insufficiency     Assessment: [age]M presenting s/p fall - with pneumothorax and rib fx,  - hx VTE and pAfib on warfarin PTA.  Pharmacy consulted to restart home warfarin. OK to continue warfarin per surgery/IR.  PTA dosing: 2.'5mg'$  daily except '5mg'$  MWF  INR today is within therapeutic range at 2.9 after holding a dose. Chest tube out.  Goal of Therapy:  INR 2-3 Monitor platelets by anticoagulation protocol: Yes   Plan:  -  Warfarin 2.'5mg'$  PO x1 tonight -Daily INR  Arrie Senate, PharmD, BCPS, [facility] Clinical Pharmacist [phone] Please check AMION for all [place] numbers 02/12/2022

## 2022-02-12 NOTE — Plan of Care (Signed)
  Problem: Clinical Measurements: Goal: Cardiovascular complication will be avoided Outcome: Progressing   Problem: Activity: Goal: Risk for activity intolerance will decrease Outcome: Progressing   Problem: Elimination: Goal: Will not experience complications related to bowel motility Outcome: Progressing Goal: Will not experience complications related to urinary retention Outcome: Progressing   Problem: Clinical Measurements: Goal: Respiratory complications will improve Outcome: Not Progressing

## 2022-02-12 NOTE — Plan of Care (Signed)
  Problem: Education: Goal: Knowledge of General Education information will improve Description: Including pain rating scale, medication(s)/side effects and non-pharmacologic comfort measures Outcome: Completed/Met   Problem: Health Behavior/Discharge Planning: Goal: Ability to manage health-related needs will improve Outcome: Completed/Met   Problem: Clinical Measurements: Goal: Ability to maintain clinical measurements within normal limits will improve Outcome: Completed/Met Goal: Will remain free from infection Outcome: Completed/Met Goal: Diagnostic test results will improve Outcome: Completed/Met Goal: Respiratory complications will improve Outcome: Completed/Met Goal: Cardiovascular complication will be avoided Outcome: Completed/Met   Problem: Activity: Goal: Risk for activity intolerance will decrease Outcome: Completed/Met   Problem: Nutrition: Goal: Adequate nutrition will be maintained Outcome: Completed/Met   Problem: Coping: Goal: Level of anxiety will decrease Outcome: Completed/Met   Problem: Elimination: Goal: Will not experience complications related to bowel motility Outcome: Completed/Met Goal: Will not experience complications related to urinary retention Outcome: Completed/Met   Problem: Pain Managment: Goal: General experience of comfort will improve Outcome: Completed/Met   Problem: Safety: Goal: Ability to remain free from injury will improve Outcome: Completed/Met   Problem: Skin Integrity: Goal: Risk for impaired skin integrity will decrease Outcome: Completed/Met   Problem: Acute Rehab OT Goals (only OT should resolve) Goal: Pt. Will Perform Grooming Outcome: Completed/Met Goal: Pt. Will Perform Upper Body Dressing Outcome: Completed/Met Goal: Pt. Will Perform Lower Body Dressing Outcome: Completed/Met Goal: Pt. Will Transfer To Toilet Outcome: Completed/Met   Problem: Acute Rehab PT Goals(only PT should resolve) Goal: Pt Will  Go Supine/Side To Sit Outcome: Completed/Met Goal: Pt Will Go Sit To Supine/Side Outcome: Completed/Met Goal: Patient Will Transfer Sit To/From Stand Outcome: Completed/Met Goal: Pt Will Ambulate Outcome: Completed/Met Goal: Pt Will Go Up/Down Stairs Outcome: Completed/Met   Problem: Increased Nutrient Needs (NI-5.1) Goal: Food and/or nutrient delivery Description: Individualized approach for food/nutrient provision. Outcome: Completed/Met

## 2022-02-14 ENCOUNTER — Telehealth: Payer: Self-pay

## 2022-02-14 DIAGNOSIS — W19XXXD Unspecified fall, subsequent encounter: Secondary | ICD-10-CM | POA: Diagnosis not present

## 2022-02-14 DIAGNOSIS — N1831 Chronic kidney disease, stage 3a: Secondary | ICD-10-CM | POA: Diagnosis not present

## 2022-02-14 DIAGNOSIS — Z7901 Long term (current) use of anticoagulants: Secondary | ICD-10-CM | POA: Diagnosis not present

## 2022-02-14 DIAGNOSIS — I129 Hypertensive chronic kidney disease with stage 1 through stage 4 chronic kidney disease, or unspecified chronic kidney disease: Secondary | ICD-10-CM | POA: Diagnosis not present

## 2022-02-14 DIAGNOSIS — I2510 Atherosclerotic heart disease of native coronary artery without angina pectoris: Secondary | ICD-10-CM | POA: Diagnosis not present

## 2022-02-14 DIAGNOSIS — I350 Nonrheumatic aortic (valve) stenosis: Secondary | ICD-10-CM | POA: Diagnosis not present

## 2022-02-14 DIAGNOSIS — T797XXD Traumatic subcutaneous emphysema, subsequent encounter: Secondary | ICD-10-CM | POA: Diagnosis not present

## 2022-02-14 DIAGNOSIS — Z951 Presence of aortocoronary bypass graft: Secondary | ICD-10-CM | POA: Diagnosis not present

## 2022-02-14 DIAGNOSIS — E7800 Pure hypercholesterolemia, unspecified: Secondary | ICD-10-CM | POA: Diagnosis not present

## 2022-02-14 DIAGNOSIS — Q791 Other congenital malformations of diaphragm: Secondary | ICD-10-CM | POA: Diagnosis not present

## 2022-02-14 DIAGNOSIS — S2241XD Multiple fractures of ribs, right side, subsequent encounter for fracture with routine healing: Secondary | ICD-10-CM | POA: Diagnosis not present

## 2022-02-14 DIAGNOSIS — Z86718 Personal history of other venous thrombosis and embolism: Secondary | ICD-10-CM | POA: Diagnosis not present

## 2022-02-14 DIAGNOSIS — I700 Atherosclerosis of aorta: Secondary | ICD-10-CM | POA: Diagnosis not present

## 2022-02-14 DIAGNOSIS — J4489 Other specified chronic obstructive pulmonary disease: Secondary | ICD-10-CM | POA: Diagnosis not present

## 2022-02-14 DIAGNOSIS — I480 Paroxysmal atrial fibrillation: Secondary | ICD-10-CM | POA: Diagnosis not present

## 2022-02-14 DIAGNOSIS — I739 Peripheral vascular disease, unspecified: Secondary | ICD-10-CM | POA: Diagnosis not present

## 2022-02-14 DIAGNOSIS — I714 Abdominal aortic aneurysm, without rupture, unspecified: Secondary | ICD-10-CM | POA: Diagnosis not present

## 2022-02-14 DIAGNOSIS — I255 Ischemic cardiomyopathy: Secondary | ICD-10-CM | POA: Diagnosis not present

## 2022-02-14 DIAGNOSIS — Z9981 Dependence on supplemental oxygen: Secondary | ICD-10-CM | POA: Diagnosis not present

## 2022-02-14 DIAGNOSIS — S270XXD Traumatic pneumothorax, subsequent encounter: Secondary | ICD-10-CM | POA: Diagnosis not present

## 2022-02-14 DIAGNOSIS — J9601 Acute respiratory failure with hypoxia: Secondary | ICD-10-CM | POA: Diagnosis not present

## 2022-02-14 DIAGNOSIS — E039 Hypothyroidism, unspecified: Secondary | ICD-10-CM | POA: Diagnosis not present

## 2022-02-14 DIAGNOSIS — J9811 Atelectasis: Secondary | ICD-10-CM | POA: Diagnosis not present

## 2022-02-14 DIAGNOSIS — J439 Emphysema, unspecified: Secondary | ICD-10-CM | POA: Diagnosis not present

## 2022-02-14 DIAGNOSIS — N179 Acute kidney failure, unspecified: Secondary | ICD-10-CM | POA: Diagnosis not present

## 2022-02-14 NOTE — Telephone Encounter (Signed)
Transition Care Management Follow-up Telephone Call Date of discharge and from where:TCM [facility] 02-12-22 Dx: acute respiratory failure   How have you been since you were released from the hospital? Doing better  Any questions or concerns? No  Items Reviewed: Did the pt receive and understand the discharge instructions provided? Yes  Medications obtained and verified? Yes  Other? No  Any new allergies since your discharge? No  Dietary orders reviewed? Yes Do you have support at home? Yes   Home Care and Equipment/Supplies: Were home health services ordered? yes If so, what is the name of the agency? [facility]  Has the agency set up a time to come to the patient's home? yes Were any new equipment or medical supplies ordered?  Yes: oxygen What is the name of the medical supply agency? [facility]  Were you able to get the supplies/equipment? yes Do you have any questions related to the use of the equipment or supplies? No  Functional Questionnaire: (I = Independent and D = Dependent) ADLs: D  Bathing/Dressing- D  Meal Prep- D  Eating- I  Maintaining continence- I  Transferring/Ambulation- I-WALKER  Managing Meds- D  Follow up appointments reviewed:  PCP Hospital f/u appt confirmed? Yes  Scheduled to see Dr [name] on 02-16-22 @ [facility] f/u appt confirmed? Yes  Scheduled to see Dr at trauma unit  on 10-31 @ anytime- . Are transportation arrangements needed? No  If their condition worsens, is the pt aware to call PCP or go to the Emergency Dept.? Yes Was the patient provided with contact information for the PCP's office or ED? Yes Was to pt encouraged to call back with questions or concerns? Yes

## 2022-02-14 NOTE — Chronic Care Management (AMB) (Addendum)
Chronic Care Management Pharmacy Assistant   Name: [name]  MRN: [id number] DOB: 11-Nov-1940  Reason for Encounter: Hospital Follow Up  non ccm    Medications: Outpatient Encounter Medications as of 02/14/2022  Medication Sig   [START ON 02/16/2022] amLODipine (NORVASC) 5 MG tablet Take 1 tablet (5 mg total) by mouth daily.   atorvastatin (LIPITOR) 20 MG tablet TAKE 1 TABLET BY MOUTH EVERY DAY (Patient taking differently: Take 20 mg by mouth daily.)   carvedilol (COREG) 6.25 MG tablet TAKE 1 TABLET BY MOUTH TWICE A DAY WITH MEALS (Patient taking differently: Take 6.25 mg by mouth 2 (two) times daily with a meal.)   cilostazol (PLETAL) 50 MG tablet TAKE 1 TABLET BY MOUTH TWICE A DAY (Patient taking differently: Take 50 mg by mouth 2 (two) times daily.)   furosemide (LASIX) 40 MG tablet Take 0.5 tablets (20 mg total) by mouth as directed. Take 20 mg once daily with extra 20 mg as needed for weight gain of 3 pounds overnight or 5 pounds in one week   levothyroxine (SYNTHROID) 150 MCG tablet TAKE ONE TABLET BY MOUTH EVERY DAY BEFORE BREAKFAST (Patient taking differently: Take 150 mcg by mouth daily before breakfast. TAKE ONE TABLET BY MOUTH EVERY DAY BEFORE BREAKFAST)   mupirocin ointment (BACTROBAN) 2 % Apply 1 Application topically daily as needed (rash).   oxyCODONE (OXY IR/ROXICODONE) 5 MG immediate release tablet Take 1-2 tablets (5-10 mg total) by mouth every 4 (four) hours as needed for up to 5 days for moderate pain.   psyllium (HYDROCIL/METAMUCIL) 95 % PACK Take 1 packet by mouth daily.   triamcinolone ointment (KENALOG) 0.1 % APPLY TOPICALLY TO AFFECTED AREAS TWICE A DAY FOR 14 DAYS THEN MAY APPLY 5 DAYS WEEKLY TO AFFECTED AREAS (Patient taking differently: Apply 1 Application topically daily as needed (rash).)   warfarin (COUMADIN) 5 MG tablet Take 0.5-1 tablets (2.5-5 mg total) by mouth See admin instructions. TAKE 1/2 TABLET DAILY BY MOUTH EXCEPT TAKE 1 TABLET ON MON, WED, FRI  OR AS DIRECTED ANTICOAGULATION CLINIC   No facility-administered encounter medications on file as of 02/14/2022.    Reviewed hospital notes for details of recent visit. Has patient been contacted by Transitions of Care team? No Has patient seen PCP/specialist for hospital follow up (summarize OV if yes): No  Admitted to the hospital on 02/06/22. Discharge date was 02/12/22.  Discharged from [facility].   Discharge diagnosis (Principal Problem): acute respiratory failure Patient was discharged to Home  Brief summary of hospital course: 81 y.o. male past medical history of CAD status post CABG, ischemic cardiomyopathy, chronic kidney disease stage IIIa with a baseline creatinine 1.5, paroxysmal atrial fibrillation on chronic Coumadin comes into the hospital after a fall found to have right fracture, pneumothorax and subcutaneous emphysema.  He initially presented to [facility] but was transferred to [facility] to the trauma service.  Did not had a syncopal event chest pain or shortness of breath.On the day of discharge she is requiring 3 L of oxygen he will go home on 3 L for 2 weeks and be titrated as an outpatient.Nondisplaced right rib fracture posterior along the seventh and eighth: Lidocaine patch working well it was treated with narcotics he was continue on incentive spirometry.Antihypertensive medications were held, they will be restarted slowly as an outpatient.Follow up with PCP in 1-2 weeks Please obtain BMP/CBC in one week  New?Medications Started at [facility] Discharge:?? -Started Oxycodone  psylluim   Medication  Changes at Hospital Discharge: -Changed amlodipine  Furosemide   Medications that remain the same after Hospital Discharge:??  -All other medications will remain the same.    Next CCM appt: none  Other upcoming appts:   02/17/22  coumadin clinic [facility]  Charlene Brooke, PharmD notified and will determine if action is needed.   Avel Sensor,  [place]  [phone]   Pharmacist addendum: Patient has spoken with [facility] team and has verified upcoming hospital f/u appts and discharge instructions. Nothing further needed at this time.  Charlene Brooke, PharmD, BCACP 02/14/22 2:58 PM

## 2022-02-16 ENCOUNTER — Encounter: Payer: Self-pay | Admitting: Family Medicine

## 2022-02-16 ENCOUNTER — Ambulatory Visit (INDEPENDENT_AMBULATORY_CARE_PROVIDER_SITE_OTHER): Payer: Medicare Other | Admitting: Family Medicine

## 2022-02-16 VITALS — BP 110/74 | HR 99 | Temp 98.3°F | Ht 68.25 in | Wt 179.2 lb

## 2022-02-16 DIAGNOSIS — S270XXD Traumatic pneumothorax, subsequent encounter: Secondary | ICD-10-CM | POA: Diagnosis not present

## 2022-02-16 DIAGNOSIS — N1831 Chronic kidney disease, stage 3a: Secondary | ICD-10-CM

## 2022-02-16 DIAGNOSIS — J431 Panlobular emphysema: Secondary | ICD-10-CM

## 2022-02-16 DIAGNOSIS — Z7901 Long term (current) use of anticoagulants: Secondary | ICD-10-CM

## 2022-02-16 DIAGNOSIS — J9601 Acute respiratory failure with hypoxia: Secondary | ICD-10-CM | POA: Diagnosis not present

## 2022-02-16 DIAGNOSIS — I480 Paroxysmal atrial fibrillation: Secondary | ICD-10-CM

## 2022-02-16 NOTE — Progress Notes (Unsigned)
    Atwell Mcdanel T. Keatyn Jawad, MD, [place] at [facility] [place], [address]  Phone: [phone]  FAX: [phone]  OTTIS VACHA - 81 y.o. male  MRN [id number]  Date of Birth: Nov 06, 1940  Date: 02/16/2022  PCP: Owens Loffler, MD  Referral: Owens Loffler, MD  Chief Complaint  Patient presents with   Hospitalization Follow-up   Subjective:   [name] is a 81 y.o. very pleasant male patient with Body mass index is 27.06 kg/m. who presents with the following:  Admit date: 02/06/2022 Discharge date: 02/12/2022  He presents for hospital follow-up.  He was found to have multiple rib fractures as well as a pneumothorax with subcutaneous emphysema.  Initially, he did present to [facility], but was transferred to the [facility] trauma service.  Does have 7 and 8 rib fracture.  While in the hospital he had acute respiratory failure requiring oxygen up to 15 L.  He rapidly transition from needing 4 L of oxygen to 15 L of oxygen in less than 24 hours. -He did have a chest tube placed by interventional radiology. -He was sent home on 3 L of oxygen  A-fib with RVR in the hospital.  He did require IV Cardizem and he converted to sinus rhythm.  He is on Coumadin chronically as well as being on a beta-blocker.  Normal systolic ejection fraction without diastolic dysfunction on echocardiogram.  Subcutaneous emphysema was profound.   Creatinine did peak to 1.9, but returned to baseline at 1.35 at the time of discharge after fluids.  99% O2 on RA in the exam room.   Pain with putting pressure on the R side.   Feels like   BMP and CBC  Review of Systems is noted in the HPI, as appropriate  Objective:   BP 110/74   Pulse 99   Temp 98.3 F (36.8 C) (Oral)   Ht 5' 8.25" (1.734 m)   Wt 179 lb 4 oz (81.3 kg)   SpO2 96%   BMI 27.06 kg/m   GEN: No acute distress; alert,appropriate. PULM: Breathing comfortably in no respiratory  distress PSYCH: Normally interactive.   Laboratory and Imaging Data:  Assessment and Plan:   ***

## 2022-02-16 NOTE — Patient Instructions (Addendum)
Furosemide 20 mg.  You have the 40 mg tablets, so take 1/2 tablet once in the morning and once in the afternoon.  Take 1/2 tablet of pain medication several times a day.

## 2022-02-17 ENCOUNTER — Ambulatory Visit: Payer: Medicare Other

## 2022-02-17 ENCOUNTER — Ambulatory Visit (INDEPENDENT_AMBULATORY_CARE_PROVIDER_SITE_OTHER): Payer: Medicare Other

## 2022-02-17 DIAGNOSIS — S270XXD Traumatic pneumothorax, subsequent encounter: Secondary | ICD-10-CM | POA: Diagnosis not present

## 2022-02-17 DIAGNOSIS — S2241XD Multiple fractures of ribs, right side, subsequent encounter for fracture with routine healing: Secondary | ICD-10-CM | POA: Diagnosis not present

## 2022-02-17 DIAGNOSIS — J439 Emphysema, unspecified: Secondary | ICD-10-CM | POA: Diagnosis not present

## 2022-02-17 DIAGNOSIS — J9601 Acute respiratory failure with hypoxia: Secondary | ICD-10-CM | POA: Diagnosis not present

## 2022-02-17 DIAGNOSIS — Z7901 Long term (current) use of anticoagulants: Secondary | ICD-10-CM

## 2022-02-17 DIAGNOSIS — J4489 Other specified chronic obstructive pulmonary disease: Secondary | ICD-10-CM | POA: Diagnosis not present

## 2022-02-17 DIAGNOSIS — T797XXD Traumatic subcutaneous emphysema, subsequent encounter: Secondary | ICD-10-CM | POA: Diagnosis not present

## 2022-02-17 LAB — CBC WITH DIFFERENTIAL/PLATELET
Basophils Absolute: 0.1 10*3/uL (ref 0.0–0.1)
Basophils Relative: 0.5 % (ref 0.0–3.0)
Eosinophils Absolute: 0.2 10*3/uL (ref 0.0–0.7)
Eosinophils Relative: 1.4 % (ref 0.0–5.0)
HCT: 40.3 % (ref 39.0–52.0)
Hemoglobin: 13 g/dL (ref 13.0–17.0)
Lymphocytes Relative: 6.1 % — ABNORMAL LOW (ref 12.0–46.0)
Lymphs Abs: 0.9 10*3/uL (ref 0.7–4.0)
MCHC: 32.2 g/dL (ref 30.0–36.0)
MCV: 87.1 fl (ref 78.0–100.0)
Monocytes Absolute: 0.7 10*3/uL (ref 0.1–1.0)
Monocytes Relative: 4.7 % (ref 3.0–12.0)
Neutro Abs: 12.3 10*3/uL — ABNORMAL HIGH (ref 1.4–7.7)
Neutrophils Relative %: 87.3 % — ABNORMAL HIGH (ref 43.0–77.0)
Platelets: 289 10*3/uL (ref 150.0–400.0)
RBC: 4.63 Mil/uL (ref 4.22–5.81)
RDW: 15.9 % — ABNORMAL HIGH (ref 11.5–15.5)
WBC: 14.1 10*3/uL — ABNORMAL HIGH (ref 4.0–10.5)

## 2022-02-17 LAB — BASIC METABOLIC PANEL
BUN: 27 mg/dL — ABNORMAL HIGH (ref 6–23)
CO2: 27 mEq/L (ref 19–32)
Calcium: 8.9 mg/dL (ref 8.4–10.5)
Chloride: 101 mEq/L (ref 96–112)
Creatinine, Ser: 1.42 mg/dL (ref 0.40–1.50)
GFR: 46.6 mL/min — ABNORMAL LOW (ref >60.00)
Glucose, Bld: 98 mg/dL (ref 70–99)
Potassium: 4.5 mEq/L (ref 3.5–5.1)
Sodium: 137 mEq/L (ref 135–145)

## 2022-02-17 LAB — PROTIME-INR
INR: 2.8 ratio — ABNORMAL HIGH (ref 0.8–1.0)
Prothrombin Time: 29.1 s — ABNORMAL HIGH (ref 9.6–13.1)

## 2022-02-17 MED ORDER — CEFDINIR 300 MG PO CAPS: 600.0000 mg | ORAL_CAPSULE | Freq: Every day | ORAL | 0 refills | Status: DC

## 2022-02-17 NOTE — Progress Notes (Signed)
INR was added to labs from 02/16/22, result is 2.8.    Called pt and instructed him to continue to take 1/2 tablet daily except take 1 tablet on Mondays, Wednesdays and Fridays.  Recheck in 3 weeks, on 03/10/2022 at 10:00.

## 2022-02-17 NOTE — Patient Instructions (Signed)
Continue to take 1/2 tablet daily except take 1 tablet on Mondays, Wednesdays and Fridays.  Recheck in 3 weeks, on 03/10/2022 at 10:00.

## 2022-02-18 ENCOUNTER — Telehealth: Payer: Self-pay | Admitting: Family Medicine

## 2022-02-18 NOTE — Telephone Encounter (Signed)
Cesar a physical therapist from [facility] called to make PCP aware that patient PT Eval will be move to next week . # [phone]

## 2022-02-21 ENCOUNTER — Telehealth: Payer: Self-pay | Admitting: *Deleted

## 2022-02-21 DIAGNOSIS — Z951 Presence of aortocoronary bypass graft: Secondary | ICD-10-CM

## 2022-02-21 DIAGNOSIS — I700 Atherosclerosis of aorta: Secondary | ICD-10-CM

## 2022-02-21 DIAGNOSIS — E7800 Pure hypercholesterolemia, unspecified: Secondary | ICD-10-CM

## 2022-02-21 DIAGNOSIS — N179 Acute kidney failure, unspecified: Secondary | ICD-10-CM | POA: Diagnosis not present

## 2022-02-21 DIAGNOSIS — E039 Hypothyroidism, unspecified: Secondary | ICD-10-CM

## 2022-02-21 DIAGNOSIS — I129 Hypertensive chronic kidney disease with stage 1 through stage 4 chronic kidney disease, or unspecified chronic kidney disease: Secondary | ICD-10-CM | POA: Diagnosis not present

## 2022-02-21 DIAGNOSIS — S2241XD Multiple fractures of ribs, right side, subsequent encounter for fracture with routine healing: Secondary | ICD-10-CM | POA: Diagnosis not present

## 2022-02-21 DIAGNOSIS — Z9181 History of falling: Secondary | ICD-10-CM

## 2022-02-21 DIAGNOSIS — J9601 Acute respiratory failure with hypoxia: Secondary | ICD-10-CM | POA: Diagnosis not present

## 2022-02-21 DIAGNOSIS — I739 Peripheral vascular disease, unspecified: Secondary | ICD-10-CM | POA: Diagnosis not present

## 2022-02-21 DIAGNOSIS — Z7901 Long term (current) use of anticoagulants: Secondary | ICD-10-CM

## 2022-02-21 DIAGNOSIS — J9811 Atelectasis: Secondary | ICD-10-CM

## 2022-02-21 DIAGNOSIS — Z86718 Personal history of other venous thrombosis and embolism: Secondary | ICD-10-CM

## 2022-02-21 DIAGNOSIS — I714 Abdominal aortic aneurysm, without rupture, unspecified: Secondary | ICD-10-CM | POA: Diagnosis not present

## 2022-02-21 DIAGNOSIS — I255 Ischemic cardiomyopathy: Secondary | ICD-10-CM

## 2022-02-21 DIAGNOSIS — T797XXD Traumatic subcutaneous emphysema, subsequent encounter: Secondary | ICD-10-CM | POA: Diagnosis not present

## 2022-02-21 DIAGNOSIS — S270XXD Traumatic pneumothorax, subsequent encounter: Secondary | ICD-10-CM | POA: Diagnosis not present

## 2022-02-21 DIAGNOSIS — I350 Nonrheumatic aortic (valve) stenosis: Secondary | ICD-10-CM

## 2022-02-21 DIAGNOSIS — N1831 Chronic kidney disease, stage 3a: Secondary | ICD-10-CM | POA: Diagnosis not present

## 2022-02-21 DIAGNOSIS — W19XXXD Unspecified fall, subsequent encounter: Secondary | ICD-10-CM

## 2022-02-21 DIAGNOSIS — Q791 Other congenital malformations of diaphragm: Secondary | ICD-10-CM

## 2022-02-21 DIAGNOSIS — J439 Emphysema, unspecified: Secondary | ICD-10-CM | POA: Diagnosis not present

## 2022-02-21 DIAGNOSIS — I2510 Atherosclerotic heart disease of native coronary artery without angina pectoris: Secondary | ICD-10-CM

## 2022-02-21 DIAGNOSIS — I480 Paroxysmal atrial fibrillation: Secondary | ICD-10-CM | POA: Diagnosis not present

## 2022-02-21 DIAGNOSIS — J4489 Other specified chronic obstructive pulmonary disease: Secondary | ICD-10-CM | POA: Diagnosis not present

## 2022-02-21 DIAGNOSIS — Z9981 Dependence on supplemental oxygen: Secondary | ICD-10-CM

## 2022-02-21 NOTE — Telephone Encounter (Signed)
-----   Message from Theora Gianotti, NP sent at 02/16/2022  5:17 PM EDT ----- Predominantly normal rhythm at an average heart rate of 63 bpm.  22 brief runs of fast heart beats stemming from the lower chambers (ventricles), and 24 brief runs of fast heartbeat stemming from the upper chambers (atria).  Overall PVC burden (reason for placing monitor) is relatively high at 17%.  I recommend repeating 2D echocardiogram to re-evaluate heart squeezing function followed by EP eval.  Continue carvedilol.

## 2022-02-21 NOTE — Telephone Encounter (Signed)
Reviewed results and recommendations with patients wife. She reports that he had been in the hospital with several problems to include his heart. Scheduled appointment with Dr. Quentin Ore for 03/23/22 at 11:40 am. She was agreeable with plan and had no further questions at this time.

## 2022-02-22 ENCOUNTER — Ambulatory Visit
Admission: RE | Admit: 2022-02-22 | Discharge: 2022-02-22 | Disposition: A | Discharge status: Home / Self Care | Payer: Medicare Other | Source: Ambulatory Visit | Attending: General Surgery | Admitting: General Surgery

## 2022-02-22 DIAGNOSIS — J9811 Atelectasis: Secondary | ICD-10-CM | POA: Diagnosis not present

## 2022-02-22 DIAGNOSIS — S270XXA Traumatic pneumothorax, initial encounter: Secondary | ICD-10-CM

## 2022-02-22 DIAGNOSIS — J90 Pleural effusion, not elsewhere classified: Secondary | ICD-10-CM | POA: Diagnosis not present

## 2022-02-22 DIAGNOSIS — J939 Pneumothorax, unspecified: Secondary | ICD-10-CM | POA: Diagnosis not present

## 2022-02-22 DIAGNOSIS — Z951 Presence of aortocoronary bypass graft: Secondary | ICD-10-CM | POA: Diagnosis not present

## 2022-02-23 ENCOUNTER — Telehealth: Payer: Self-pay | Admitting: General Surgery

## 2022-02-23 ENCOUNTER — Other Ambulatory Visit: Payer: Self-pay | Admitting: General Surgery

## 2022-02-23 DIAGNOSIS — J9601 Acute respiratory failure with hypoxia: Secondary | ICD-10-CM | POA: Diagnosis not present

## 2022-02-23 DIAGNOSIS — J439 Emphysema, unspecified: Secondary | ICD-10-CM | POA: Diagnosis not present

## 2022-02-23 DIAGNOSIS — J939 Pneumothorax, unspecified: Secondary | ICD-10-CM

## 2022-02-23 DIAGNOSIS — S2241XD Multiple fractures of ribs, right side, subsequent encounter for fracture with routine healing: Secondary | ICD-10-CM | POA: Diagnosis not present

## 2022-02-23 DIAGNOSIS — J4489 Other specified chronic obstructive pulmonary disease: Secondary | ICD-10-CM | POA: Diagnosis not present

## 2022-02-23 DIAGNOSIS — T797XXD Traumatic subcutaneous emphysema, subsequent encounter: Secondary | ICD-10-CM | POA: Diagnosis not present

## 2022-02-23 DIAGNOSIS — S270XXD Traumatic pneumothorax, subsequent encounter: Secondary | ICD-10-CM | POA: Diagnosis not present

## 2022-02-23 NOTE — Telephone Encounter (Signed)
Notified patients spouse of his recent chest x-ray results. No visible pneumothorax and interval improvement in subcutaneous emphysema. Plan for repeat chest x-ray in 2 weeks to confirm resolution of subcutaneous emphysema.   At the patients request we will see if this can be performed in [place] rather than [place].

## 2022-02-24 DIAGNOSIS — J4489 Other specified chronic obstructive pulmonary disease: Secondary | ICD-10-CM | POA: Diagnosis not present

## 2022-02-24 DIAGNOSIS — S2241XD Multiple fractures of ribs, right side, subsequent encounter for fracture with routine healing: Secondary | ICD-10-CM | POA: Diagnosis not present

## 2022-02-24 DIAGNOSIS — J9601 Acute respiratory failure with hypoxia: Secondary | ICD-10-CM | POA: Diagnosis not present

## 2022-02-24 DIAGNOSIS — T797XXD Traumatic subcutaneous emphysema, subsequent encounter: Secondary | ICD-10-CM | POA: Diagnosis not present

## 2022-02-24 DIAGNOSIS — J439 Emphysema, unspecified: Secondary | ICD-10-CM | POA: Diagnosis not present

## 2022-02-24 DIAGNOSIS — S270XXD Traumatic pneumothorax, subsequent encounter: Secondary | ICD-10-CM | POA: Diagnosis not present

## 2022-02-24 NOTE — Addendum Note (Signed)
Addended by: Saverio Danker on: 02/24/2022 10:38 AM   Modules accepted: Orders

## 2022-02-25 ENCOUNTER — Telehealth: Payer: Self-pay | Admitting: Family Medicine

## 2022-02-25 NOTE — Telephone Encounter (Signed)
[name] from [facility] called for approval to move PT eval to next week from today.

## 2022-02-25 NOTE — Telephone Encounter (Signed)
Cesar notified by telephone that it is okay to move PT evaluation to next week per Dr. [name].

## 2022-02-28 ENCOUNTER — Ambulatory Visit: Payer: Medicare Other | Admitting: Family Medicine

## 2022-02-28 DIAGNOSIS — J439 Emphysema, unspecified: Secondary | ICD-10-CM | POA: Diagnosis not present

## 2022-02-28 DIAGNOSIS — J4489 Other specified chronic obstructive pulmonary disease: Secondary | ICD-10-CM | POA: Diagnosis not present

## 2022-02-28 DIAGNOSIS — T797XXD Traumatic subcutaneous emphysema, subsequent encounter: Secondary | ICD-10-CM | POA: Diagnosis not present

## 2022-02-28 DIAGNOSIS — S270XXD Traumatic pneumothorax, subsequent encounter: Secondary | ICD-10-CM | POA: Diagnosis not present

## 2022-02-28 DIAGNOSIS — J9601 Acute respiratory failure with hypoxia: Secondary | ICD-10-CM | POA: Diagnosis not present

## 2022-02-28 DIAGNOSIS — S2241XD Multiple fractures of ribs, right side, subsequent encounter for fracture with routine healing: Secondary | ICD-10-CM | POA: Diagnosis not present

## 2022-03-01 ENCOUNTER — Other Ambulatory Visit: Payer: Medicare Other

## 2022-03-02 DIAGNOSIS — T797XXD Traumatic subcutaneous emphysema, subsequent encounter: Secondary | ICD-10-CM | POA: Diagnosis not present

## 2022-03-02 DIAGNOSIS — J9601 Acute respiratory failure with hypoxia: Secondary | ICD-10-CM | POA: Diagnosis not present

## 2022-03-02 DIAGNOSIS — S270XXD Traumatic pneumothorax, subsequent encounter: Secondary | ICD-10-CM | POA: Diagnosis not present

## 2022-03-02 DIAGNOSIS — S2241XD Multiple fractures of ribs, right side, subsequent encounter for fracture with routine healing: Secondary | ICD-10-CM | POA: Diagnosis not present

## 2022-03-02 DIAGNOSIS — J4489 Other specified chronic obstructive pulmonary disease: Secondary | ICD-10-CM | POA: Diagnosis not present

## 2022-03-02 DIAGNOSIS — J439 Emphysema, unspecified: Secondary | ICD-10-CM | POA: Diagnosis not present

## 2022-03-03 ENCOUNTER — Telehealth: Payer: Self-pay | Admitting: Cardiology

## 2022-03-03 ENCOUNTER — Telehealth: Payer: Self-pay | Admitting: Family Medicine

## 2022-03-03 ENCOUNTER — Encounter: Payer: Self-pay | Admitting: Nurse Practitioner

## 2022-03-03 ENCOUNTER — Encounter: Payer: Self-pay | Admitting: Cardiovascular Disease

## 2022-03-03 DIAGNOSIS — S270XXD Traumatic pneumothorax, subsequent encounter: Secondary | ICD-10-CM | POA: Diagnosis not present

## 2022-03-03 DIAGNOSIS — J9601 Acute respiratory failure with hypoxia: Secondary | ICD-10-CM | POA: Diagnosis not present

## 2022-03-03 DIAGNOSIS — J439 Emphysema, unspecified: Secondary | ICD-10-CM | POA: Diagnosis not present

## 2022-03-03 DIAGNOSIS — S2241XD Multiple fractures of ribs, right side, subsequent encounter for fracture with routine healing: Secondary | ICD-10-CM | POA: Diagnosis not present

## 2022-03-03 DIAGNOSIS — T797XXD Traumatic subcutaneous emphysema, subsequent encounter: Secondary | ICD-10-CM | POA: Diagnosis not present

## 2022-03-03 DIAGNOSIS — I700 Atherosclerosis of aorta: Secondary | ICD-10-CM

## 2022-03-03 DIAGNOSIS — J4489 Other specified chronic obstructive pulmonary disease: Secondary | ICD-10-CM | POA: Diagnosis not present

## 2022-03-03 NOTE — Telephone Encounter (Signed)
Advised wife (DPR) patient had Echo and wore heart monitor. That is good from an EP standpoint to keep appt as scheduled.

## 2022-03-03 NOTE — Telephone Encounter (Signed)
Home Health verbal orders Caller Name: [place] Name: [name] [place] number: [phone]  Requesting PT  Frequency: 1x a week for 2 weeks  Please forward to [facility] pool or providers CMA

## 2022-03-03 NOTE — Telephone Encounter (Signed)
error 

## 2022-03-03 NOTE — Telephone Encounter (Signed)
agree

## 2022-03-03 NOTE — Telephone Encounter (Signed)
Patient's wife states the patient had to cancel his testing, because he had a fall and has not recovered. She is not sure if his appointment at the end of the month with Dr. Quentin Ore needed these test results prior and is not sure if that appointment needs to be put off. Please advise.

## 2022-03-03 NOTE — Telephone Encounter (Signed)
Verbal orders given to [facility] via telephone for  PT 1x a week for 2 weeks.

## 2022-03-04 ENCOUNTER — Ambulatory Visit: Payer: Medicare Other

## 2022-03-09 ENCOUNTER — Ambulatory Visit
Admission: RE | Admit: 2022-03-09 | Discharge: 2022-03-09 | Disposition: A | Discharge status: Home / Self Care | Payer: Medicare Other | Source: Ambulatory Visit | Attending: General Surgery | Admitting: General Surgery

## 2022-03-09 ENCOUNTER — Other Ambulatory Visit: Payer: Self-pay | Admitting: General Surgery

## 2022-03-09 DIAGNOSIS — J939 Pneumothorax, unspecified: Secondary | ICD-10-CM

## 2022-03-09 DIAGNOSIS — J439 Emphysema, unspecified: Secondary | ICD-10-CM | POA: Diagnosis not present

## 2022-03-10 ENCOUNTER — Telehealth: Payer: Self-pay | Admitting: General Surgery

## 2022-03-10 ENCOUNTER — Ambulatory Visit (INDEPENDENT_AMBULATORY_CARE_PROVIDER_SITE_OTHER): Payer: Medicare Other

## 2022-03-10 DIAGNOSIS — Z7901 Long term (current) use of anticoagulants: Secondary | ICD-10-CM | POA: Diagnosis not present

## 2022-03-10 DIAGNOSIS — J9601 Acute respiratory failure with hypoxia: Secondary | ICD-10-CM | POA: Diagnosis not present

## 2022-03-10 DIAGNOSIS — T797XXD Traumatic subcutaneous emphysema, subsequent encounter: Secondary | ICD-10-CM | POA: Diagnosis not present

## 2022-03-10 DIAGNOSIS — S270XXD Traumatic pneumothorax, subsequent encounter: Secondary | ICD-10-CM | POA: Diagnosis not present

## 2022-03-10 DIAGNOSIS — S2241XD Multiple fractures of ribs, right side, subsequent encounter for fracture with routine healing: Secondary | ICD-10-CM | POA: Diagnosis not present

## 2022-03-10 DIAGNOSIS — J4489 Other specified chronic obstructive pulmonary disease: Secondary | ICD-10-CM | POA: Diagnosis not present

## 2022-03-10 DIAGNOSIS — J439 Emphysema, unspecified: Secondary | ICD-10-CM | POA: Diagnosis not present

## 2022-03-10 LAB — POCT INR: INR: 5.6 — AB (ref 2.0–3.0)

## 2022-03-10 NOTE — Progress Notes (Signed)
Hold warfarin today and tomorrow.   Change weekly dose to take 1/2 tablet daily except take 1 tablet on Mondays.  Recheck in 2 weeks.

## 2022-03-10 NOTE — Patient Instructions (Signed)
Hold warfarin today and tomorrow.   Change weekly dose to take 1/2 tablet daily except take 1 tablet on Mondays.  Recheck on 11/30 at 10:30.

## 2022-03-10 NOTE — Telephone Encounter (Signed)
Called patient and wife answered his phone for him.  She states he is recovering well and is not having any issues.  I informed her that his CXR is not officially read, but we were following up on the subcutaneous emphysema still present on his last CXR.  This x-rays shows resolution of this and no other major issues.  He is not having any pulmonary complaints.  No further follow up needed at this time.  Henreitta Cea 2:50 PM 03/10/2022

## 2022-03-11 DIAGNOSIS — J439 Emphysema, unspecified: Secondary | ICD-10-CM | POA: Diagnosis not present

## 2022-03-11 DIAGNOSIS — J4489 Other specified chronic obstructive pulmonary disease: Secondary | ICD-10-CM | POA: Diagnosis not present

## 2022-03-11 DIAGNOSIS — S2241XD Multiple fractures of ribs, right side, subsequent encounter for fracture with routine healing: Secondary | ICD-10-CM | POA: Diagnosis not present

## 2022-03-11 DIAGNOSIS — S270XXD Traumatic pneumothorax, subsequent encounter: Secondary | ICD-10-CM | POA: Diagnosis not present

## 2022-03-11 DIAGNOSIS — T797XXD Traumatic subcutaneous emphysema, subsequent encounter: Secondary | ICD-10-CM | POA: Diagnosis not present

## 2022-03-11 DIAGNOSIS — J9601 Acute respiratory failure with hypoxia: Secondary | ICD-10-CM | POA: Diagnosis not present

## 2022-03-15 DIAGNOSIS — J439 Emphysema, unspecified: Secondary | ICD-10-CM | POA: Diagnosis not present

## 2022-03-15 DIAGNOSIS — J9601 Acute respiratory failure with hypoxia: Secondary | ICD-10-CM | POA: Diagnosis not present

## 2022-03-15 DIAGNOSIS — S270XXD Traumatic pneumothorax, subsequent encounter: Secondary | ICD-10-CM | POA: Diagnosis not present

## 2022-03-15 DIAGNOSIS — T797XXD Traumatic subcutaneous emphysema, subsequent encounter: Secondary | ICD-10-CM | POA: Diagnosis not present

## 2022-03-15 DIAGNOSIS — S2241XD Multiple fractures of ribs, right side, subsequent encounter for fracture with routine healing: Secondary | ICD-10-CM | POA: Diagnosis not present

## 2022-03-15 DIAGNOSIS — J4489 Other specified chronic obstructive pulmonary disease: Secondary | ICD-10-CM | POA: Diagnosis not present

## 2022-03-16 DIAGNOSIS — J9811 Atelectasis: Secondary | ICD-10-CM | POA: Diagnosis not present

## 2022-03-16 DIAGNOSIS — Z951 Presence of aortocoronary bypass graft: Secondary | ICD-10-CM | POA: Diagnosis not present

## 2022-03-16 DIAGNOSIS — J439 Emphysema, unspecified: Secondary | ICD-10-CM | POA: Diagnosis not present

## 2022-03-16 DIAGNOSIS — S2241XD Multiple fractures of ribs, right side, subsequent encounter for fracture with routine healing: Secondary | ICD-10-CM | POA: Diagnosis not present

## 2022-03-16 DIAGNOSIS — Q791 Other congenital malformations of diaphragm: Secondary | ICD-10-CM | POA: Diagnosis not present

## 2022-03-16 DIAGNOSIS — S270XXD Traumatic pneumothorax, subsequent encounter: Secondary | ICD-10-CM | POA: Diagnosis not present

## 2022-03-16 DIAGNOSIS — Z86718 Personal history of other venous thrombosis and embolism: Secondary | ICD-10-CM | POA: Diagnosis not present

## 2022-03-16 DIAGNOSIS — I2510 Atherosclerotic heart disease of native coronary artery without angina pectoris: Secondary | ICD-10-CM | POA: Diagnosis not present

## 2022-03-16 DIAGNOSIS — Z7901 Long term (current) use of anticoagulants: Secondary | ICD-10-CM | POA: Diagnosis not present

## 2022-03-16 DIAGNOSIS — I739 Peripheral vascular disease, unspecified: Secondary | ICD-10-CM | POA: Diagnosis not present

## 2022-03-16 DIAGNOSIS — N1831 Chronic kidney disease, stage 3a: Secondary | ICD-10-CM | POA: Diagnosis not present

## 2022-03-16 DIAGNOSIS — E7800 Pure hypercholesterolemia, unspecified: Secondary | ICD-10-CM | POA: Diagnosis not present

## 2022-03-16 DIAGNOSIS — E039 Hypothyroidism, unspecified: Secondary | ICD-10-CM | POA: Diagnosis not present

## 2022-03-16 DIAGNOSIS — Z9981 Dependence on supplemental oxygen: Secondary | ICD-10-CM | POA: Diagnosis not present

## 2022-03-16 DIAGNOSIS — I700 Atherosclerosis of aorta: Secondary | ICD-10-CM | POA: Diagnosis not present

## 2022-03-16 DIAGNOSIS — I129 Hypertensive chronic kidney disease with stage 1 through stage 4 chronic kidney disease, or unspecified chronic kidney disease: Secondary | ICD-10-CM | POA: Diagnosis not present

## 2022-03-16 DIAGNOSIS — J9601 Acute respiratory failure with hypoxia: Secondary | ICD-10-CM | POA: Diagnosis not present

## 2022-03-16 DIAGNOSIS — N179 Acute kidney failure, unspecified: Secondary | ICD-10-CM | POA: Diagnosis not present

## 2022-03-16 DIAGNOSIS — I350 Nonrheumatic aortic (valve) stenosis: Secondary | ICD-10-CM | POA: Diagnosis not present

## 2022-03-16 DIAGNOSIS — W19XXXD Unspecified fall, subsequent encounter: Secondary | ICD-10-CM | POA: Diagnosis not present

## 2022-03-16 DIAGNOSIS — T797XXD Traumatic subcutaneous emphysema, subsequent encounter: Secondary | ICD-10-CM | POA: Diagnosis not present

## 2022-03-16 DIAGNOSIS — I480 Paroxysmal atrial fibrillation: Secondary | ICD-10-CM | POA: Diagnosis not present

## 2022-03-16 DIAGNOSIS — I714 Abdominal aortic aneurysm, without rupture, unspecified: Secondary | ICD-10-CM | POA: Diagnosis not present

## 2022-03-16 DIAGNOSIS — J4489 Other specified chronic obstructive pulmonary disease: Secondary | ICD-10-CM | POA: Diagnosis not present

## 2022-03-16 DIAGNOSIS — I255 Ischemic cardiomyopathy: Secondary | ICD-10-CM | POA: Diagnosis not present

## 2022-03-22 NOTE — Progress Notes (Unsigned)
Electrophysiology Office Note:    Date:  03/23/2022   ID:  [name], DOB November 19, 1940, MRN [id number]  PCP:  [name] Epley, MD  [facility] Cardiologist:  [name], MD  [facility] Electrophysiologist:  [name] Epley, MD   Referring MD: [name] Loffler, MD   Chief Complaint: PVCs  History of Present Illness:    [name] is a 81 y.o. male who presents for an evaluation of PVCs at the request of Dr [name]. Their medical history includes HFrEF, RAS, CAD s/p CABG, HTN, hypothyroidism, AS, DVT.  The patient last saw Dr [name] 02/16/2022 after hospitalization for traumatic pneumothorax requiring chest tube placement by IR. During hospitalization he had AF w/ RVR. He is on coumadin chronically.   He is having PND episodes multiple times per night.  He has severe lower extremity edema.  Some nights he is unable to sleep at all and sits at the dining room table.    Past Medical History:  Diagnosis Date   ([name]) heart failure with improved ejection fraction ([name])    a. 06/2011 Echo: EF 35-40%; b. 06/2012 Echo: EF 50%; c. 12/2016 Echo: EF 55-60%, no rwma, GRI DD, mild AS/MR, nl RV fxn, nl RVSP; d. 02/2021 Echo: EF 55%, GrII DD. Mild MR. Mild to mod TR. Sev AS by VTI.   AAA (abdominal aortic aneurysm) ([name])    a. 05/2020 Abd u/s: 3.6cm.   Aortic calcification (HCC) 05/01/2013   Bilateral renal artery stenosis ([name])    a. 06/2013 Angio/PTA: LRA: 95 (5x18 Herculink stent), RRA 60ost; b. 04/2021 Angio: mod RRA stenosis w/ patent LRA stent.   Carotid arterial disease ([place])    a. 05/2020 Carotid U/S: RICA 1-66%, LICA 06-30%   Coronary artery disease    a. 2013 s/p CABG x 3 (LIMA->LAD, VG->OM, VG->RPDA; b. 06/2012 MV: no ischemia; c. 02/2021 Cath: LM 100, RCA 90ost, 120m free LIMA->LAD nl, VG->OM2 nl, VG->RPDA nl. Mod AS (mean grad 149mg, AVA 1.1cm^2). RHC w/ elev filling pressures.   History of prior cigarette smoking 04/11/2008   Qualifier: Diagnosis of  By: [name] BrownerMD, [name]     Hypertension    Hypothyroidism    Intraventricular hemorrhage (HCJudson03/20/2014   Ischemic cardiomyopathy    a. 06/2011 Echo: EF 35-40%; b. 06/2012 Echo: EF 50%; c. 12/2016 Echo: EF 55-60%, no rwma, GRI DD; d. 02/2021 Echo: Ef 55%, GrII DD.   Moderate aortic stenosis    a. 02/2021 Echo: EF 55%, GrII DD. Sev Ca2+ of AoV. Sev AS w/ AVA by VTI 0.79cm^2; b. 02/2021 Cath: Mod AS w/ mean grad 1550m. AVA 1.1cm^2.   Peripheral arterial disease (HCCOwyhee  a. Previous left lower extremity stenting by Dr. SanJamal Collinb. 12/2012 s/p bilat ostial common iliac stenting; c. 04/2021 Angio: Patent LRA stent, mod RRA stenosis. Small AAA. Sev plaque RSFA 2/ subtl occl of inf branch of profunda. Diff Ca2+ SFA dzs w/ collats from profunda and 3V runoff. Diffuse LSFA dzs w/ collats from profunda. Subtl PT dzs. 3V runoff-->Med Rx.   Right leg DVT (HCCElim4/21/2014   Subclavian artery stenosis, left (HCCThe Meadows2/2014   Status post stenting of the ostium and self-expanding stent placement to the left axillary artery   Venous insufficiency     Past Surgical History:  Procedure Laterality Date   ABDOMINAL ANGIOGRAM  07/10/2013   WITH BI-FEMORAL RUNOFF       DR ARIFletcher AnonABDOMINAL AORTAGRAM N/A 12/26/2012   Procedure: ABDOMINAL  Maxcine Ham;  Surgeon: [name] Hampshire, MD;  Location: [name] CATH LAB;  Service: Cardiovascular;  Laterality: N/A;   ABDOMINAL AORTAGRAM N/A 07/10/2013   Procedure: ABDOMINAL Maxcine Ham;  Surgeon: [name] Hampshire, MD;  Location: [name] CATH LAB;  Service: Cardiovascular;  Laterality: N/A;   ABDOMINAL AORTOGRAM W/LOWER EXTREMITY N/A 05/12/2021   Procedure: ABDOMINAL AORTOGRAM W/LOWER EXTREMITY;  Surgeon: [name] Hampshire, MD;  Location: [name] CV LAB;  Service: Cardiovascular;  Laterality: N/A;   ANGIOPLASTY / STENTING FEMORAL  05/2012   ANGIOPLASTY / STENTING ILIAC Bilateral 12/26/2012   ARCH AORTOGRAM N/A 06/20/2012   Procedure: ARCH AORTOGRAM;  Surgeon: [name] Hampshire, MD;  Location: [name] CATH  LAB;  Service: Cardiovascular;  Laterality: N/A;   CARDIAC CATHETERIZATION  05/2012   [place]   CHOLECYSTECTOMY  1990's   CORONARY ARTERY BYPASS GRAFT  07/11/2011   Procedure: CORONARY ARTERY BYPASS GRAFTING (CABG);  Surgeon: [name] Isaac, MD;  Location: [name];  Service: Open Heart Surgery;  Laterality: N/A;  Times 3. On Pump. Using right greater saphenous vein and left internal mammary artery.    CORONARY ARTERY BYPASS GRAFT     HERNIA REPAIR     INSERTION OF ILIAC STENT Bilateral 12/26/2012   Procedure: INSERTION OF ILIAC STENT;  Surgeon: [name] Hampshire, MD;  Location: [place] CATH LAB;  Service: Cardiovascular;  Laterality: Bilateral;  Bilateral Common Iliac Artery   LEFT AND RIGHT HEART CATHETERIZATION WITH CORONARY ANGIOGRAM  07/07/2011   Procedure: LEFT AND RIGHT HEART CATHETERIZATION WITH CORONARY ANGIOGRAM;  Surgeon: [name] Merritts, MD;  Location: [place] CATH LAB;  Service: Cardiovascular;;   LOWER EXTREMITY VENOGRAPHY Bilateral 12/30/2016   Procedure: Lower Extremity Venography;  Surgeon: [name] Cabal, MD;  Location: [name] CV LAB;  Service: Cardiovascular;  Laterality: Bilateral;   PERCUTANEOUS STENT INTERVENTION  06/20/2012   Procedure: PERCUTANEOUS STENT INTERVENTION;  Surgeon: [name] Hampshire, MD;  Location: [place] CATH LAB;  Service: Cardiovascular;;   RENAL ARTERY ANGIOPLASTY Left 07/10/2013   DR ARIDA   RIGHT/LEFT HEART CATH AND CORONARY/GRAFT ANGIOGRAPHY N/A 03/15/2021   Procedure: RIGHT/LEFT HEART CATH AND CORONARY/GRAFT ANGIOGRAPHY;  Surgeon: [name] Hampshire, MD;  Location: [name] CV LAB;  Service: Cardiovascular;  Laterality: N/A;   SUBCLAVIAN ARTERY STENT  05/2012   "2 stents" (12/26/2012)   TOOTH EXTRACTION  Spring 2017   mulitple (6)   VENA CAVA FILTER PLACEMENT  07/2012   Removable    Current Medications: Current Meds  Medication Sig   amLODipine (NORVASC) 5 MG tablet Take 1 tablet (5 mg total) by mouth daily.   atorvastatin (LIPITOR) 20 MG tablet  TAKE 1 TABLET BY MOUTH EVERY DAY   augmented betamethasone dipropionate (DIPROLENE-AF) 0.05 % cream APPLY TO AFFECTED AREA TWICE A DAY   carvedilol (COREG) 6.25 MG tablet TAKE 1 TABLET BY MOUTH TWICE A DAY WITH MEALS   cilostazol (PLETAL) 50 MG tablet TAKE 1 TABLET BY MOUTH TWICE A DAY   furosemide (LASIX) 40 MG tablet Take 0.5 tablets (20 mg total) by mouth as directed. Take 20 mg once daily with extra 20 mg as needed for weight gain of 3 pounds overnight or 5 pounds in one week   levothyroxine (SYNTHROID) 150 MCG tablet TAKE ONE TABLET BY MOUTH EVERY DAY BEFORE BREAKFAST   mupirocin ointment (BACTROBAN) 2 % Apply 1 Application topically daily as needed (rash).   psyllium (HYDROCIL/METAMUCIL) 95 % PACK Take 1 packet by mouth daily.   triamcinolone ointment (KENALOG) 0.1 % APPLY TOPICALLY TO  AFFECTED AREAS TWICE A DAY FOR 14 DAYS THEN MAY APPLY 5 DAYS WEEKLY TO AFFECTED AREAS (Patient taking differently: Apply 1 Application topically daily as needed (rash).)   warfarin (COUMADIN) 5 MG tablet Take 0.5-1 tablets (2.5-5 mg total) by mouth See admin instructions. TAKE 1/2 TABLET DAILY BY MOUTH EXCEPT TAKE 1 TABLET ON MON, WED, FRI OR AS DIRECTED ANTICOAGULATION CLINIC     Allergies:   Plavix [clopidogrel bisulfate]   Social History   Socioeconomic History   Marital status: Married    Spouse name: Not on file   Number of children: 2   Years of education: Not on file   Highest education level: Not on file  Occupational History   Occupation: Retired    Fish farm manager: RETIRED    Comment: Land  Tobacco Use   Smoking status: Former    Packs/day: 1.00    Years: 60.00    Total pack years: 60.00    Types: Cigarettes    Quit date: 12/25/2010    Years since quitting: 11.2   Smokeless tobacco: Never  Vaping Use   Vaping Use: Never used  Substance and Sexual Activity   Alcohol use: No   Drug use: No   Sexual activity: Not Currently  Other Topics Concern   Not on file  Social History  Narrative   Exercising 3 times a week.    Moderate diet control.    O living will, no HCPOA.   Social Determinants of Health   Financial Resource Strain: Low Risk  (07/05/2021)   Overall Financial Resource Strain (CARDIA)    Difficulty of Paying Living Expenses: Not very hard  Food Insecurity: No Food Insecurity (07/05/2021)   Hunger Vital Sign    Worried About Running Out of Food in the Last Year: Never true    Ran Out of Food in the Last Year: Never true  Transportation Needs: No Transportation Needs (07/05/2021)   PRAPARE - Hydrologist (Medical): No    Lack of Transportation (Non-Medical): No  Physical Activity: Insufficiently Active (07/05/2021)   Exercise Vital Sign    Days of Exercise per Week: 7 days    Minutes of Exercise per Session: 20 min  Stress: No Stress Concern Present (07/05/2021)   [place]    Feeling of Stress : Not at all  Social Connections: Moderately Isolated (07/05/2021)   Social Connection and Isolation Panel [NHANES]    Frequency of Communication with Friends and Family: More than three times a week    Frequency of Social Gatherings with Friends and Family: More than three times a week    Attends Religious Services: Never    Marine scientist or Organizations: No    Attends Music therapist: Never    Marital Status: Married     Family History: The patient's family history includes Alcohol abuse in his father; Cirrhosis in his father; Hypothyroidism in his sister.  ROS:   Please see the history of present illness.    All other systems reviewed and are negative.  EKGs/Labs/Other Studies Reviewed:    The following studies were reviewed today:  02/09/2022 Echo EF 55 RV normal Dilated LA/RA Mild MR  02/16/2022 zio 17% PVC burden  02/08/2022 ECG Atrial fib/flutter w V rate 123 bpm RBBB PVC    EKG:  The ekg ordered today  demonstrates sinus rhythm with occasional PVCs, monomorphic.   Recent Labs: 07/05/2021: TSH 0.42 02/08/2022:  ALT 15; Magnesium 2.4 02/09/2022: B Natriuretic Peptide 350.9 02/16/2022: BUN 27; Creatinine, Ser 1.42; Hemoglobin 13.0; Platelets 289.0; Potassium 4.5; Sodium 137  Recent Lipid Panel    Component Value Date/Time   CHOL 121 07/05/2021 0849   CHOL 125 01/31/2014 0756   TRIG 115.0 07/05/2021 0849   HDL 59.40 07/05/2021 0849   HDL 63 01/31/2014 0756   CHOLHDL 2 07/05/2021 0849   VLDL 23.0 07/05/2021 0849   LDLCALC 39 07/05/2021 0849   LDLCALC 46 01/31/2014 0756    Physical Exam:    VS:  BP (!) 100/52   Pulse 80   Ht 5' 8.25" (1.734 m)   Wt 175 lb (79.4 kg)   SpO2 94%   BMI 26.41 kg/m     Wt Readings from Last 3 Encounters:  03/23/22 175 lb (79.4 kg)  02/16/22 179 lb 4 oz (81.3 kg)  02/06/22 162 lb 15.8 oz (73.9 kg)     GEN: Chronically ill-appearing, frail, mild to moderate increased work of breathing HEENT: Normal NECK: No carotid bruits LYMPHATICS: No lymphadenopathy CARDIAC: Irregular rhythm, no murmurs, rubs, gallops RESPIRATORY: Poor aeration to bilateral bases.  Expiratory wheezing noted bilaterally.  There are crackles noted in bilateral lung fields.   ABDOMEN: Soft, non-tender, non-distended MUSCULOSKELETAL:  2+ pitting bilateral lower extremity edema to the thighs; No deformity  SKIN: Warm and dry NEUROLOGIC:  Alert and oriented x 3 PSYCHIATRIC:  Normal affect       ASSESSMENT:    1. PVC's (premature ventricular contractions)   2. Paroxysmal atrial fibrillation (HCC)   3. Coronary artery disease involving native coronary artery of native heart without angina pectoris   4. Chronic heart failure with preserved ejection fraction (HFpEF) (HCC)    PLAN:    In order of problems listed above:   #PVCs High burden on recent Zio, 17% At this time, efforts should focus on improving his volume status.  During the hospitalization could consider up  titration of Coreg if hemodynamically tolerated.  Also consider mexiletine.  #paroxysmal atrial fib On coumadin  Low burden on recent zio  #Acute on chronic diastolic heart failure Warm and massively volume overloaded on exam today.  I recommended presentation to the ER.  He will require admission and several days of IV diuresis and heart failure medication regimen titration. He will need to repeat echo this hospitalization.     Medication Adjustments/Labs and Tests Ordered: Current medicines are reviewed at length with the patient today.  Concerns regarding medicines are outlined above.  No orders of the defined types were placed in this encounter.  No orders of the defined types were placed in this encounter.    Signed, Hilton Cork. Quentin Ore, MD, [facility], [facility] 03/23/2022 11:45 AM    Electrophysiology [facility]

## 2022-03-23 ENCOUNTER — Emergency Department: Payer: Medicare Other

## 2022-03-23 ENCOUNTER — Ambulatory Visit: Payer: Medicare Other | Attending: Cardiology | Admitting: Cardiology

## 2022-03-23 ENCOUNTER — Other Ambulatory Visit: Payer: Self-pay

## 2022-03-23 ENCOUNTER — Encounter: Payer: Self-pay | Admitting: Cardiology

## 2022-03-23 ENCOUNTER — Inpatient Hospital Stay
Admission: EM | Admit: 2022-03-23 | Discharge: 2022-03-26 | DRG: 291 | Disposition: A | Discharge status: Home / Self Care | Payer: Medicare Other | Source: Ambulatory Visit | Attending: Osteopathic Medicine | Admitting: Osteopathic Medicine

## 2022-03-23 VITALS — BP 100/52 | HR 80 | Ht 68.25 in | Wt 175.0 lb

## 2022-03-23 DIAGNOSIS — I701 Atherosclerosis of renal artery: Secondary | ICD-10-CM | POA: Diagnosis present

## 2022-03-23 DIAGNOSIS — I872 Venous insufficiency (chronic) (peripheral): Secondary | ICD-10-CM | POA: Diagnosis present

## 2022-03-23 DIAGNOSIS — Z86718 Personal history of other venous thrombosis and embolism: Secondary | ICD-10-CM | POA: Diagnosis not present

## 2022-03-23 DIAGNOSIS — I110 Hypertensive heart disease with heart failure: Secondary | ICD-10-CM | POA: Diagnosis not present

## 2022-03-23 DIAGNOSIS — E039 Hypothyroidism, unspecified: Secondary | ICD-10-CM | POA: Diagnosis present

## 2022-03-23 DIAGNOSIS — Z79890 Hormone replacement therapy (postmenopausal): Secondary | ICD-10-CM

## 2022-03-23 DIAGNOSIS — Z1152 Encounter for screening for COVID-19: Secondary | ICD-10-CM

## 2022-03-23 DIAGNOSIS — I5033 Acute on chronic diastolic (congestive) heart failure: Secondary | ICD-10-CM | POA: Diagnosis not present

## 2022-03-23 DIAGNOSIS — I959 Hypotension, unspecified: Secondary | ICD-10-CM | POA: Diagnosis not present

## 2022-03-23 DIAGNOSIS — I493 Ventricular premature depolarization: Secondary | ICD-10-CM

## 2022-03-23 DIAGNOSIS — Z7901 Long term (current) use of anticoagulants: Secondary | ICD-10-CM

## 2022-03-23 DIAGNOSIS — I2510 Atherosclerotic heart disease of native coronary artery without angina pectoris: Secondary | ICD-10-CM | POA: Diagnosis present

## 2022-03-23 DIAGNOSIS — I878 Other specified disorders of veins: Secondary | ICD-10-CM | POA: Insufficient documentation

## 2022-03-23 DIAGNOSIS — Z87891 Personal history of nicotine dependence: Secondary | ICD-10-CM | POA: Diagnosis not present

## 2022-03-23 DIAGNOSIS — I255 Ischemic cardiomyopathy: Secondary | ICD-10-CM | POA: Diagnosis present

## 2022-03-23 DIAGNOSIS — I350 Nonrheumatic aortic (valve) stenosis: Secondary | ICD-10-CM | POA: Diagnosis not present

## 2022-03-23 DIAGNOSIS — I130 Hypertensive heart and chronic kidney disease with heart failure and stage 1 through stage 4 chronic kidney disease, or unspecified chronic kidney disease: Principal | ICD-10-CM | POA: Diagnosis present

## 2022-03-23 DIAGNOSIS — I10 Essential (primary) hypertension: Secondary | ICD-10-CM | POA: Diagnosis not present

## 2022-03-23 DIAGNOSIS — Z8673 Personal history of transient ischemic attack (TIA), and cerebral infarction without residual deficits: Secondary | ICD-10-CM

## 2022-03-23 DIAGNOSIS — J431 Panlobular emphysema: Secondary | ICD-10-CM | POA: Diagnosis not present

## 2022-03-23 DIAGNOSIS — I080 Rheumatic disorders of both mitral and aortic valves: Secondary | ICD-10-CM | POA: Diagnosis present

## 2022-03-23 DIAGNOSIS — Z951 Presence of aortocoronary bypass graft: Secondary | ICD-10-CM

## 2022-03-23 DIAGNOSIS — I25118 Atherosclerotic heart disease of native coronary artery with other forms of angina pectoris: Secondary | ICD-10-CM | POA: Diagnosis not present

## 2022-03-23 DIAGNOSIS — I70209 Unspecified atherosclerosis of native arteries of extremities, unspecified extremity: Secondary | ICD-10-CM | POA: Diagnosis present

## 2022-03-23 DIAGNOSIS — I480 Paroxysmal atrial fibrillation: Secondary | ICD-10-CM | POA: Diagnosis not present

## 2022-03-23 DIAGNOSIS — I714 Abdominal aortic aneurysm, without rupture, unspecified: Secondary | ICD-10-CM | POA: Diagnosis present

## 2022-03-23 DIAGNOSIS — E7800 Pure hypercholesterolemia, unspecified: Secondary | ICD-10-CM | POA: Diagnosis present

## 2022-03-23 DIAGNOSIS — E038 Other specified hypothyroidism: Secondary | ICD-10-CM

## 2022-03-23 DIAGNOSIS — J9621 Acute and chronic respiratory failure with hypoxia: Secondary | ICD-10-CM | POA: Diagnosis present

## 2022-03-23 DIAGNOSIS — I739 Peripheral vascular disease, unspecified: Secondary | ICD-10-CM | POA: Diagnosis present

## 2022-03-23 DIAGNOSIS — W010XXD Fall on same level from slipping, tripping and stumbling without subsequent striking against object, subsequent encounter: Secondary | ICD-10-CM | POA: Diagnosis present

## 2022-03-23 DIAGNOSIS — J449 Chronic obstructive pulmonary disease, unspecified: Secondary | ICD-10-CM | POA: Diagnosis present

## 2022-03-23 DIAGNOSIS — J441 Chronic obstructive pulmonary disease with (acute) exacerbation: Secondary | ICD-10-CM | POA: Diagnosis not present

## 2022-03-23 DIAGNOSIS — I5032 Chronic diastolic (congestive) heart failure: Secondary | ICD-10-CM

## 2022-03-23 DIAGNOSIS — Z79899 Other long term (current) drug therapy: Secondary | ICD-10-CM | POA: Diagnosis not present

## 2022-03-23 DIAGNOSIS — S2249XD Multiple fractures of ribs, unspecified side, subsequent encounter for fracture with routine healing: Secondary | ICD-10-CM

## 2022-03-23 DIAGNOSIS — I509 Heart failure, unspecified: Secondary | ICD-10-CM

## 2022-03-23 DIAGNOSIS — N1831 Chronic kidney disease, stage 3a: Secondary | ICD-10-CM | POA: Diagnosis not present

## 2022-03-23 DIAGNOSIS — Z95820 Peripheral vascular angioplasty status with implants and grafts: Secondary | ICD-10-CM

## 2022-03-23 DIAGNOSIS — R0602 Shortness of breath: Secondary | ICD-10-CM | POA: Diagnosis not present

## 2022-03-23 DIAGNOSIS — Z9049 Acquired absence of other specified parts of digestive tract: Secondary | ICD-10-CM

## 2022-03-23 DIAGNOSIS — I5031 Acute diastolic (congestive) heart failure: Secondary | ICD-10-CM | POA: Diagnosis not present

## 2022-03-23 DIAGNOSIS — I5030 Unspecified diastolic (congestive) heart failure: Secondary | ICD-10-CM | POA: Insufficient documentation

## 2022-03-23 LAB — PROTIME-INR
INR: 2.5 — ABNORMAL HIGH (ref 0.8–1.2)
Prothrombin Time: 27 seconds — ABNORMAL HIGH (ref 11.4–15.2)

## 2022-03-23 LAB — CBC
HCT: 39.1 % (ref 39.0–52.0)
Hemoglobin: 12 g/dL — ABNORMAL LOW (ref 13.0–17.0)
MCH: 28 pg (ref 26.0–34.0)
MCHC: 30.7 g/dL (ref 30.0–36.0)
MCV: 91.4 fL (ref 80.0–100.0)
Platelets: 276 10*3/uL (ref 150–400)
RBC: 4.28 MIL/uL (ref 4.22–5.81)
RDW: 16.5 % — ABNORMAL HIGH (ref 11.5–15.5)
WBC: 7.2 10*3/uL (ref 4.0–10.5)
nRBC: 0 % (ref 0.0–0.2)

## 2022-03-23 LAB — BASIC METABOLIC PANEL
Anion gap: 8 (ref 5–15)
BUN: 20 mg/dL (ref 8–23)
CO2: 26 mmol/L (ref 22–32)
Calcium: 8.5 mg/dL — ABNORMAL LOW (ref 8.9–10.3)
Chloride: 109 mmol/L (ref 98–111)
Creatinine, Ser: 1.29 mg/dL — ABNORMAL HIGH (ref 0.61–1.24)
GFR, Estimated: 56 mL/min — ABNORMAL LOW (ref >60)
Glucose, Bld: 94 mg/dL (ref 70–99)
Potassium: 4.3 mmol/L (ref 3.5–5.1)
Sodium: 143 mmol/L (ref 135–145)

## 2022-03-23 LAB — HEPATIC FUNCTION PANEL
ALT: 13 U/L (ref 0–44)
AST: 14 U/L — ABNORMAL LOW (ref 15–41)
Albumin: 3.3 g/dL — ABNORMAL LOW (ref 3.5–5.0)
Alkaline Phosphatase: 67 U/L (ref 38–126)
Bilirubin, Direct: 0.2 mg/dL (ref 0.0–0.2)
Indirect Bilirubin: 0.6 mg/dL (ref 0.3–0.9)
Total Bilirubin: 0.8 mg/dL (ref 0.3–1.2)
Total Protein: 7 g/dL (ref 6.5–8.1)

## 2022-03-23 LAB — TROPONIN I (HIGH SENSITIVITY)
Troponin I (High Sensitivity): 50 ng/L — ABNORMAL HIGH (ref <18)
Troponin I (High Sensitivity): 46 ng/L — ABNORMAL HIGH (ref <18)

## 2022-03-23 LAB — BRAIN NATRIURETIC PEPTIDE: B Natriuretic Peptide: 630.4 pg/mL — ABNORMAL HIGH (ref 0.0–100.0)

## 2022-03-23 LAB — PROCALCITONIN: Procalcitonin: <0.1 ng/mL

## 2022-03-23 LAB — SARS CORONAVIRUS 2 BY RT PCR: SARS Coronavirus 2 by RT PCR: NEGATIVE

## 2022-03-23 MED ORDER — WARFARIN - PHARMACIST DOSING INPATIENT
Freq: Every day | Status: DC
  Filled 2022-03-23: qty 1

## 2022-03-23 MED ORDER — SODIUM CHLORIDE 0.9% FLUSH: 3.0000 mL | INTRAVENOUS | Status: DC | PRN

## 2022-03-23 MED ORDER — PSYLLIUM 95 % PO PACK
1.0000 | PACK | Freq: Every day | ORAL | Status: DC
  Filled 2022-03-23 (×3): qty 1

## 2022-03-23 MED ORDER — WARFARIN SODIUM 5 MG PO TABS
5.0000 mg | ORAL_TABLET | Freq: Once | ORAL | Status: AC
  Administered 2022-03-23: 5 mg via ORAL
  Filled 2022-03-23: qty 1

## 2022-03-23 MED ORDER — ONDANSETRON HCL 4 MG/2ML IJ SOLN: 4.0000 mg | Freq: Four times a day (QID) | INTRAMUSCULAR | Status: DC | PRN

## 2022-03-23 MED ORDER — ACETAMINOPHEN 325 MG PO TABS: 650.0000 mg | ORAL_TABLET | ORAL | Status: DC | PRN

## 2022-03-23 MED ORDER — CILOSTAZOL 100 MG PO TABS
50.0000 mg | ORAL_TABLET | Freq: Two times a day (BID) | ORAL | Status: DC
  Administered 2022-03-23 – 2022-03-26 (×6): 50 mg via ORAL
  Filled 2022-03-23 (×7): qty 0.5

## 2022-03-23 MED ORDER — CARVEDILOL 6.25 MG PO TABS
6.2500 mg | ORAL_TABLET | Freq: Two times a day (BID) | ORAL | Status: DC
  Administered 2022-03-23 – 2022-03-26 (×6): 6.25 mg via ORAL
  Filled 2022-03-23 (×6): qty 1

## 2022-03-23 MED ORDER — FUROSEMIDE 10 MG/ML IJ SOLN
40.0000 mg | Freq: Once | INTRAMUSCULAR | Status: AC
  Administered 2022-03-23: 40 mg via INTRAVENOUS
  Filled 2022-03-23: qty 4

## 2022-03-23 MED ORDER — MUPIROCIN 2 % EX OINT: 1.0000 | TOPICAL_OINTMENT | Freq: Every day | CUTANEOUS | Status: DC | PRN

## 2022-03-23 MED ORDER — ATORVASTATIN CALCIUM 20 MG PO TABS
20.0000 mg | ORAL_TABLET | Freq: Every day | ORAL | Status: DC
  Administered 2022-03-24 – 2022-03-26 (×3): 20 mg via ORAL
  Filled 2022-03-23 (×3): qty 1

## 2022-03-23 MED ORDER — AMLODIPINE BESYLATE 5 MG PO TABS: 5.0000 mg | ORAL_TABLET | Freq: Every day | ORAL | Status: DC

## 2022-03-23 MED ORDER — TRIAMCINOLONE ACETONIDE 0.5 % EX CREA
1.0000 | TOPICAL_CREAM | Freq: Two times a day (BID) | CUTANEOUS | Status: DC
  Administered 2022-03-23 – 2022-03-24 (×2): 1 via TOPICAL
  Filled 2022-03-23 (×2): qty 15

## 2022-03-23 MED ORDER — FUROSEMIDE 10 MG/ML IJ SOLN
40.0000 mg | Freq: Two times a day (BID) | INTRAMUSCULAR | Status: DC
  Administered 2022-03-23 – 2022-03-25 (×4): 40 mg via INTRAVENOUS
  Filled 2022-03-23 (×4): qty 4

## 2022-03-23 MED ORDER — SODIUM CHLORIDE 0.9 % IV SOLN: 250.0000 mL | INTRAVENOUS | Status: DC | PRN

## 2022-03-23 MED ORDER — FUROSEMIDE 10 MG/ML IJ SOLN: 40.0000 mg | Freq: Two times a day (BID) | INTRAMUSCULAR | Status: DC

## 2022-03-23 MED ORDER — LEVOTHYROXINE SODIUM 50 MCG PO TABS
150.0000 ug | ORAL_TABLET | Freq: Every day | ORAL | Status: DC
  Administered 2022-03-24 – 2022-03-26 (×3): 150 ug via ORAL
  Filled 2022-03-23: qty 1
  Filled 2022-03-23: qty 3
  Filled 2022-03-23: qty 1

## 2022-03-23 MED ORDER — SODIUM CHLORIDE 0.9% FLUSH
3.0000 mL | Freq: Two times a day (BID) | INTRAVENOUS | Status: DC
  Administered 2022-03-24 – 2022-03-26 (×5): 3 mL via INTRAVENOUS

## 2022-03-23 NOTE — ED Provider Notes (Signed)
[facility] Provider Note    Event Date/Time   First MD Initiated Contact with Patient 03/23/22 1233     (approximate)   History   Shortness of Breath   HPI  [name] is a 81 y.o. male with past medical history of COPD with chronic hypoxic respiratory failure, recent hospitalization about 6 weeks ago for fall with rib fractures, complicated by pneumothorax, diastolic heart failure, here with shortness of breath.  Patient states that over the last 6 weeks, since discharge, he has had severe bilateral leg swelling, dyspnea, and fatigue.  He thought it was initially just due to his rib fractures.  This pain has actually improved, but the patient now has progressive worsening shortness of breath with severe tachypnea and difficulty even walking across the room.  He states he cannot even speak in long sentences.  He denies any chest pain.  He has had a cough.  No sputum production.  No fevers.     Physical Exam   Triage Vital Signs: ED Triage Vitals  Enc Vitals Group     BP 03/23/22 1233 (!) 108/52     Pulse Rate 03/23/22 1233 100     Resp 03/23/22 1233 (!) 26     Temp 03/23/22 1233 98 F (36.7 C)     Temp src --      SpO2 03/23/22 1233 94 %     Weight --      Height --      Head Circumference --      Peak Flow --      Pain Score 03/23/22 1225 0     Pain Loc --      Pain Edu? --      Excl. in [place]? --     Most recent vital signs: Vitals:   03/23/22 1600 03/23/22 1649  BP: (!) 125/97   Pulse: 87   Resp: (!) 29   Temp:  98.2 F (36.8 C)  SpO2: 94%      General: Awake, no distress.  CV:  Good peripheral perfusion.  Resp:  Increased work of breathing with bilateral rhonchi and rales.  Speaking in short sentences. Abd:  No distention.  Other:  Cachectic, appears chronically ill.   ED Results / Procedures / Treatments   Labs (all labs ordered are listed, but only abnormal results are displayed) Labs Reviewed  BASIC METABOLIC PANEL -  Abnormal; Notable for the following components:      Result Value   Creatinine, Ser 1.29 (*)    Calcium 8.5 (*)    GFR, Estimated 56 (*)    All other components within normal limits  CBC - Abnormal; Notable for the following components:   Hemoglobin 12.0 (*)    RDW 16.5 (*)    All other components within normal limits  HEPATIC FUNCTION PANEL - Abnormal; Notable for the following components:   Albumin 3.3 (*)    AST 14 (*)    All other components within normal limits  BRAIN NATRIURETIC PEPTIDE - Abnormal; Notable for the following components:   B Natriuretic Peptide 630.4 (*)    All other components within normal limits  PROTIME-INR - Abnormal; Notable for the following components:   Prothrombin Time 27.0 (*)    INR 2.5 (*)    All other components within normal limits  TROPONIN I (HIGH SENSITIVITY) - Abnormal; Notable for the following components:   Troponin I (High Sensitivity) 50 (*)    All other components  within normal limits  TROPONIN I (HIGH SENSITIVITY) - Abnormal; Notable for the following components:   Troponin I (High Sensitivity) 46 (*)    All other components within normal limits  SARS CORONAVIRUS 2 BY RT PCR  PROCALCITONIN  BASIC METABOLIC PANEL  PROTIME-INR  CBC     EKG Atrial fibrillation, trickle rate 100.  QRS 102, QTc 549.  No acute ST elevations or depressions.  No acute evidence of acute ischemia or infarct.   RADIOLOGY Chest x-ray: Diffuse bilateral ancestral opacities consistent with likely edema, less likely infection   I also independently reviewed and agree with radiologist interpretations.   PROCEDURES:  Critical Care performed: No   MEDICATIONS ORDERED IN ED: Medications  sodium chloride flush (NS) 0.9 % injection 3 mL (has no administration in time range)  sodium chloride flush (NS) 0.9 % injection 3 mL (has no administration in time range)  0.9 %  sodium chloride infusion (has no administration in time range)  acetaminophen (TYLENOL)  tablet 650 mg (has no administration in time range)  ondansetron (ZOFRAN) injection 4 mg (has no administration in time range)  levothyroxine (SYNTHROID) tablet 150 mcg (has no administration in time range)  carvedilol (COREG) tablet 6.25 mg (6.25 mg Oral Given 03/23/22 1816)  cilostazol (PLETAL) tablet 50 mg (has no administration in time range)  mupirocin ointment (BACTROBAN) 2 % 1 Application (has no administration in time range)  atorvastatin (LIPITOR) tablet 20 mg (has no administration in time range)  psyllium (HYDROCIL/METAMUCIL) 1 packet (has no administration in time range)  triamcinolone cream (KENALOG) 0.5 % 1 Application (has no administration in time range)  furosemide (LASIX) injection 40 mg (40 mg Intravenous Given 03/23/22 1815)  warfarin (COUMADIN) tablet 5 mg (has no administration in time range)  Warfarin - Pharmacist Dosing Inpatient (has no administration in time range)  furosemide (LASIX) injection 40 mg (40 mg Intravenous Given 03/23/22 1337)     IMPRESSION / MDM / ASSESSMENT AND PLAN / ED COURSE  I reviewed the triage vital signs and the nursing notes.                               This patient presents to the ED for concern of SOB, this involves an extensive number of treatment options, and is a complaint that carries with it a high risk of complications and morbidity.  The differential diagnosis includes CHF, PNA, anemia, ACS, URI, deconditioning, recurrent PTX   Co morbidities that complicate the patient evaluation  COPD CAD Ischemic CM PAD HTN H/o DVT   Additional history obtained:  Additional history obtained from wife External records from outside source obtained and reviewed including recent admission at [facility] for PTX   Lab Tests:  I Ordered, and personally interpreted labs.  The pertinent results include:   Trop 46 Procal negative BMP BUN 20, Cr 1.29 CBC with no leukocytosis BNP 630, trop 50   Imaging Studies ordered:  I ordered  imaging studies including CXR  I independently visualized and interpreted imaging which showed: CXR shows diffuse interstitail opacities c/w pulmonary edema I agree with the radiologist interpretation   Cardiac Monitoring: / EKG:  The patient was maintained on a cardiac monitor.  I personally viewed and interpreted the cardiac monitored which showed an underlying rhythm of: NSR   Problem List / ED Course / Critical interventions / Medication management  SOB Exam and imaging is consistent with acute CHF exacerbation. Trop, BNP  elevated and CXR shows bilateral edema. No signs of infection, normal WBC, no fever. Will start IV lasix, admit to medicine. I ordered medication including IV lasix  for edema  Reevaluation of the patient after these medicines showed that the patient improved I have reviewed the patients home medicines and have made adjustments as needed     Test / Admission - Considered:  Admit to medicine   FINAL CLINICAL IMPRESSION(S) / ED DIAGNOSES   Final diagnoses:  Acute on chronic diastolic congestive heart failure ([place])     Rx / DC Orders   ED Discharge Orders     None        Note:  This document was prepared using Dragon voice recognition software and may include unintentional dictation errors.   Duffy Bruce, MD 03/23/22 [phone]

## 2022-03-23 NOTE — Consult Note (Signed)
Cardiology Consultation   Patient ID: [name] MRN: [id number]; DOB: [birth year]  Admit date: 03/23/2022 Date of Consult: 03/23/2022  PCP:  [name], [name] Providers Cardiologist:  [name] Sacramento, MD  Electrophysiologist:  [name] Epley, MD  {  Patient Profile:   [name] is a 81 y.o. male with a hx of peripheral arterial disease, renal artery stenosis, carotid arterial disease, tobacco abuse, COPD, abdominal aortic aneurysm, intracranial hemorrhage in the setting of malignant hypertension, hyperlipidemia, bilateral lower extremity DVTs, heart failure with improved EF/ischemic cardiomyopathy, CAD status post CABG x 4 in March 2013 who is being seen 03/23/2022 for the evaluation of volume overload at the request of [name].  History of Present Illness:   [name] previously underwent stenting of the left subclavian and left axillary arteries as well as bilateral common iliac and left renal arteries.  As noted above, in March 2013, he underwent CABG x 3 with placement of a LIMA to LAD, vein graft to the RPDA, and vein graft to the obtuse marginal.  He suffered an intracranial hemorrhage in 2014 which was suspected to be due to malignant hypertension and dual antiplatelet therapy.  Shortly thereafter, he was diagnosed with extensive right-sided DVT and underwent IVC filter in the setting of contraindication for anticoagulation.  In 2018, he had extensive bilateral occlusive DVTs, which required catheter-based therapy and he has been on anticoagulation since then with warfarin.  EF was previously as low as 35 to 40% prior to his bypass surgery with subsequent improvement to 55 to 60% by echo in 2018.  The fall 2023, he was seen secondary to worsening dyspnea, bilateral leg claudication, and upper extremity rash.  Rash improved with prednisone but worsened again following completion of course.  Echo in November 2022 showed an EF of 55% with grade 2  diastolic dysfunction, mild MR, mild to moderate TR, and severe aortic stenosis by VTI with a valve area of 0.79 cm, but only mild aortic stenosis by gradient.  The aortic root was mildly dilated at 37 mm.  In the setting of severe AAS with progressive dyspnea and known CAD, he was set up for cardiac catheterization which took place in November 2022 and showed 3 out of 3 patent grafts with known occlusive left main and RCA disease.  Aortic stenosis was moderate by gradient 15 mmHg and valve area 1.1 cm.  Right heart catheterization was notable for elevated filling pressures and Lasix 20 mg daily was resumed.  Due to worsening claudication at follow-up, he underwent repeat ABIs which were abnormal at 0.61 on the right and 0.56 on the left.  Lower extremity angiogram was performed January 2023 which showed patent left renal artery stent with moderate right renal artery stenosis, patent bilateral iliac kissing stents extending into the distal aorta with mild restenosis, subtotal occlusion of the inferior branch of the right profunda with diffuse calcified SFA disease and collaterals from the profunda and three-vessel runoff below the knee.  On the left, he had diffuse calcified disease of the SFA with collaterals from the profunda and a subtotal occlusion of the popliteal artery behind the knee with three-vessel runoff below the knee.  There were not felt to be a good endovascular option and thought endarterectomy of the ostial SFA profunda could be considered, he is not felt to be a good surgical candidate.  Medical therapy was recommended with trial of cilostazol.  Patient was admitted in October 2023 after a  fall found to have right fracture, pneumothorax and subcutaneous emphysema.  He initially presented to [facility] but was transferred to [facility] to the trauma service.  He was initially quiring 15 L of O2.  CT showed contusion workup was unremarkable, they recommended conservative management as the patient  slowly started to require less and less oxygen.  Hospitalization the patient had A-fib with RVR, rate controlled with Coreg.  Echo showed EF 29%, no diastolic dysfunction.  Patient was continue on warfarin.  Patient was seen in the cardiology office 03/23/2022 by [name].  Patient reported progressive shortness of breath and severe lower extremity edema.  Patient was sent to the ER from the office.  Patient denied chest pain.  In the ER blood pressure 108/52, pulse 100 bpm, respiratory rate 26, afebrile.  Labs showed creatinine 1.29, potassium 4.3, CBC 7.2, hemoglobin 12, albumin 3.3, AST 14, ALT 13, BNP 638.  High-sensitivity troponin was 50 and then 46.  COVID-negative.  X-ray showed diffuse bilateral interstitial opacities perihilar and bibasilar regions, left worse than right, possible pulmonary edema or infection.  EKG showed sinus rhythm with a heart rate of 100, poor baseline, RAD, nonspecific T wave changes.  The patient was given IV Lasix and admitted for further workup.  Past Medical History:  Diagnosis Date   ([place]) heart failure with improved ejection fraction ([place])    a. 06/2011 Echo: EF 35-40%; b. 06/2012 Echo: EF 50%; c. 12/2016 Echo: EF 55-60%, no rwma, GRI DD, mild AS/MR, nl RV fxn, nl RVSP; d. 02/2021 Echo: EF 55%, GrII DD. Mild MR. Mild to mod TR. Sev AS by VTI.   AAA (abdominal aortic aneurysm) ([place])    a. 05/2020 Abd u/s: 3.6cm.   Aortic calcification (HCC) 05/01/2013   Bilateral renal artery stenosis ([place])    a. 06/2013 Angio/PTA: LRA: 95 (5x18 Herculink stent), RRA 60ost; b. 04/2021 Angio: mod RRA stenosis w/ patent LRA stent.   Carotid arterial disease ([place])    a. 05/2020 Carotid U/S: RICA 5-28%, LICA 41-32%   Coronary artery disease    a. 2013 s/p CABG x 3 (LIMA->LAD, VG->OM, VG->RPDA; b. 06/2012 MV: no ischemia; c. 02/2021 Cath: LM 100, RCA 90ost, 164m free LIMA->LAD nl, VG->OM2 nl, VG->RPDA nl. Mod AS (mean grad 136mg, AVA 1.1cm^2). RHC w/ elev filling pressures.    History of prior cigarette smoking 04/11/2008   Qualifier: Diagnosis of  By: [name], [name]     Hypertension    Hypothyroidism    Intraventricular hemorrhage (HCO[place]03/20/2014   Ischemic cardiomyopathy    a. 06/2011 Echo: EF 35-40%; b. 06/2012 Echo: EF 50%; c. 12/2016 Echo: EF 55-60%, no rwma, GRI DD; d. 02/2021 Echo: Ef 55%, GrII DD.   Moderate aortic stenosis    a. 02/2021 Echo: EF 55%, GrII DD. Sev Ca2+ of AoV. Sev AS w/ AVA by VTI 0.79cm^2; b. 02/2021 Cath: Mod AS w/ mean grad 1516m. AVA 1.1cm^2.   Peripheral arterial disease ([facility]CC[place]  a. Previous left lower extremity stenting by Dr. SanJamal Collinb. 12/2012 s/p bilat ostial common iliac stenting; c. 04/2021 Angio: Patent LRA stent, mod RRA stenosis. Small AAA. Sev plaque RSFA 2/ subtl occl of inf branch of profunda. Diff Ca2+ SFA dzs w/ collats from profunda and 3V runoff. Diffuse LSFA dzs w/ collats from profunda. Subtl PT dzs. 3V runoff-->Med Rx.   Right leg DVT (HCCSeminary4/21/2014   Subclavian artery stenosis, left (HCCWoodland2/2014   Status post stenting of the ostium and self-expanding stent  placement to the left axillary artery   Venous insufficiency     Past Surgical History:  Procedure Laterality Date   ABDOMINAL ANGIOGRAM  07/10/2013   WITH BI-FEMORAL RUNOFF       DR Fletcher Anon   ABDOMINAL AORTAGRAM N/A 12/26/2012   Procedure: ABDOMINAL Maxcine Ham;  Surgeon: Wellington Hampshire, MD;  Location: [place] CATH LAB;  Service: Cardiovascular;  Laterality: N/A;   ABDOMINAL AORTAGRAM N/A 07/10/2013   Procedure: ABDOMINAL Maxcine Ham;  Surgeon: Wellington Hampshire, MD;  Location: [place] CATH LAB;  Service: Cardiovascular;  Laterality: N/A;   ABDOMINAL AORTOGRAM W/LOWER EXTREMITY N/A 05/12/2021   Procedure: ABDOMINAL AORTOGRAM W/LOWER EXTREMITY;  Surgeon: Wellington Hampshire, MD;  Location: [place] CV LAB;  Service: Cardiovascular;  Laterality: N/A;   ANGIOPLASTY / STENTING FEMORAL  05/2012   ANGIOPLASTY / STENTING ILIAC Bilateral 12/26/2012   ARCH AORTOGRAM N/A  06/20/2012   Procedure: ARCH AORTOGRAM;  Surgeon: Wellington Hampshire, MD;  Location: [place] CATH LAB;  Service: Cardiovascular;  Laterality: N/A;   CARDIAC CATHETERIZATION  05/2012   [place]   CHOLECYSTECTOMY  1990's   CORONARY ARTERY BYPASS GRAFT  07/11/2011   Procedure: CORONARY ARTERY BYPASS GRAFTING (CABG);  Surgeon: Grace Isaac, MD;  Location: [place];  Service: Open Heart Surgery;  Laterality: N/A;  Times 3. On Pump. Using right greater saphenous vein and left internal mammary artery.    CORONARY ARTERY BYPASS GRAFT     HERNIA REPAIR     INSERTION OF ILIAC STENT Bilateral 12/26/2012   Procedure: INSERTION OF ILIAC STENT;  Surgeon: Wellington Hampshire, MD;  Location: [place] CATH LAB;  Service: Cardiovascular;  Laterality: Bilateral;  Bilateral Common Iliac Artery   LEFT AND RIGHT HEART CATHETERIZATION WITH CORONARY ANGIOGRAM  07/07/2011   Procedure: LEFT AND RIGHT HEART CATHETERIZATION WITH CORONARY ANGIOGRAM;  Surgeon: Minna Merritts, MD;  Location: [place] CATH LAB;  Service: Cardiovascular;;   LOWER EXTREMITY VENOGRAPHY Bilateral 12/30/2016   Procedure: Lower Extremity Venography;  Surgeon: Katha Cabal, MD;  Location: [place] CV LAB;  Service: Cardiovascular;  Laterality: Bilateral;   PERCUTANEOUS STENT INTERVENTION  06/20/2012   Procedure: PERCUTANEOUS STENT INTERVENTION;  Surgeon: Wellington Hampshire, MD;  Location: [place] CATH LAB;  Service: Cardiovascular;;   RENAL ARTERY ANGIOPLASTY Left 07/10/2013   DR ARIDA   RIGHT/LEFT HEART CATH AND CORONARY/GRAFT ANGIOGRAPHY N/A 03/15/2021   Procedure: RIGHT/LEFT HEART CATH AND CORONARY/GRAFT ANGIOGRAPHY;  Surgeon: Wellington Hampshire, MD;  Location: [place] CV LAB;  Service: Cardiovascular;  Laterality: N/A;   SUBCLAVIAN ARTERY STENT  05/2012   "2 stents" (12/26/2012)   TOOTH EXTRACTION  Spring 2017   mulitple (6)   VENA CAVA FILTER PLACEMENT  07/2012   Removable     Home Medications:  Prior to Admission medications   Medication Sig Start  Date End Date Taking? Authorizing Provider  amLODipine (NORVASC) 5 MG tablet Take 1 tablet (5 mg total) by mouth daily. 02/16/22   Charlynne Cousins, MD  atorvastatin (LIPITOR) 20 MG tablet TAKE 1 TABLET BY MOUTH EVERY DAY 12/24/21   Wellington Hampshire, MD  augmented betamethasone dipropionate (DIPROLENE-AF) 0.05 % cream APPLY TO AFFECTED AREA TWICE A DAY 03/14/22   [provider]  carvedilol (COREG) 6.25 MG tablet TAKE 1 TABLET BY MOUTH TWICE A DAY WITH MEALS 08/03/21   Copland, Frederico Hamman, MD  cilostazol (PLETAL) 50 MG tablet TAKE 1 TABLET BY MOUTH TWICE A DAY 08/03/21   Wellington Hampshire, MD  furosemide (LASIX) 40 MG  tablet Take 0.5 tablets (20 mg total) by mouth as directed. Take 20 mg once daily with extra 20 mg as needed for weight gain of 3 pounds overnight or 5 pounds in one week 02/13/22   Charlynne Cousins, MD  levothyroxine (SYNTHROID) 150 MCG tablet TAKE ONE TABLET BY MOUTH EVERY DAY BEFORE BREAKFAST 08/03/21   Copland, Frederico Hamman, MD  mupirocin ointment (BACTROBAN) 2 % Apply 1 Application topically daily as needed (rash). 10/20/21   [provider]  psyllium (HYDROCIL/METAMUCIL) 95 % PACK Take 1 packet by mouth daily. 02/12/22   Charlynne Cousins, MD  triamcinolone ointment (KENALOG) 0.1 % APPLY TOPICALLY TO AFFECTED AREAS TWICE A DAY FOR 14 DAYS THEN MAY APPLY 5 DAYS WEEKLY TO AFFECTED AREAS Patient taking differently: Apply 1 Application topically daily as needed (rash). 12/21/21   Ralene Bathe, MD  warfarin (COUMADIN) 5 MG tablet Take 0.5-1 tablets (2.5-5 mg total) by mouth See admin instructions. TAKE 1/2 TABLET DAILY BY MOUTH EXCEPT TAKE 1 TABLET ON MON, WED, FRI OR AS DIRECTED ANTICOAGULATION CLINIC 03/12/21   Copland, Frederico Hamman, MD    Inpatient Medications: Scheduled Meds:  Continuous Infusions:  PRN Meds:   Allergies:    Allergies  Allergen Reactions   Plavix [Clopidogrel Bisulfate]     Brain hemorrhage prev while on plavix and aspirin    Social  History:   Social History   Socioeconomic History   Marital status: Married    Spouse name: Not on file   Number of children: 2   Years of education: Not on file   Highest education level: Not on file  Occupational History   Occupation: Retired    Fish farm manager: RETIRED    Comment: [employer]  Tobacco Use   Smoking status: Former    Packs/day: 1.00    Years: 60.00    Total pack years: 60.00    Types: Cigarettes    Quit date: 12/25/2010    Years since quitting: 11.2   Smokeless tobacco: Never  Vaping Use   Vaping Use: Never used  Substance and Sexual Activity   Alcohol use: No   Drug use: No   Sexual activity: Not Currently  Other Topics Concern   Not on file  Social History Narrative   Exercising 3 times a week.    Moderate diet control.    O living will, no HCPOA.   Social Determinants of Health   Financial Resource Strain: Low Risk  (07/05/2021)   Overall Financial Resource Strain (CARDIA)    Difficulty of Paying Living Expenses: Not very hard  Food Insecurity: No Food Insecurity (07/05/2021)   Hunger Vital Sign    Worried About Running Out of Food in the Last Year: Never true    Ran Out of Food in the Last Year: Never true  Transportation Needs: No Transportation Needs (07/05/2021)   PRAPARE - Hydrologist (Medical): No    Lack of Transportation (Non-Medical): No  Physical Activity: Insufficiently Active (07/05/2021)   Exercise Vital Sign    Days of Exercise per Week: 7 days    Minutes of Exercise per Session: 20 min  Stress: No Stress Concern Present (07/05/2021)   [place]    Feeling of Stress : Not at all  Social Connections: Moderately Isolated (07/05/2021)   Social Connection and Isolation Panel [NHANES]    Frequency of Communication with Friends and Family: More than three times a week  Frequency of Social Gatherings with Friends and Family: More than three  times a week    Attends Religious Services: Never    Marine scientist or Organizations: No    Attends Archivist Meetings: Never    Marital Status: Married  Human resources officer Violence: Not At Risk (07/05/2021)   Humiliation, Afraid, Rape, and Kick questionnaire    Fear of Current or Ex-Partner: No    Emotionally Abused: No    Physically Abused: No    Sexually Abused: No    Family History:    Family History  Problem Relation Age of Onset   Alcohol abuse Father    Cirrhosis Father    Hypothyroidism Sister      ROS:  Please see the history of present illness.   All other ROS reviewed and negative.     Physical Exam/Data:   Vitals:   03/23/22 1330 03/23/22 1400 03/23/22 1430 03/23/22 1500  BP: (!) 122/59 127/70 113/74 130/61  Pulse: 73 75 71 81  Resp: (!) 23 19 (!) 25 (!) 31  Temp:      SpO2: 94% 96% 96% 92%   No intake or output data in the 24 hours ending 03/23/22 1527    03/23/2022   11:37 AM 02/16/2022    2:03 PM 02/06/2022    3:08 PM  Last 3 Weights  Weight (lbs) 175 lb 179 lb 4 oz 162 lb 15.8 oz  Weight (kg) 79.379 kg 81.307 kg 73.93 kg     There is no height or weight on file to calculate BMI.  General:  Well nourished, in minimal respiratory distress HEENT: normal Neck: + JVD Vascular: No carotid bruits; Distal pulses 2+ bilaterally Cardiac:  normal S1, S2; RRR; no murmur  Lungs:  wheezing and crackles  Abd: soft, nontender, no hepatomegaly  Ext: 2-3+ lower leg edema Musculoskeletal:  No deformities, BUE and BLE strength normal and equal Skin: warm and dry  Neuro:  CNs 2-12 intact, no focal abnormalities noted Psych:  Normal affect   EKG:  The EKG was personally reviewed and demonstrates:  SR, 100bpm, RAD, nonspecific T wave changes Telemetry:  Telemetry was personally reviewed and demonstrates:  NSR, PVCs, NSVT 3 beats, HR 80s  Relevant CV Studies:  Echo 02/09/22 1. Left ventricular ejection fraction, by estimation, is 55 to 60%.  The  left ventricle has normal function. The left ventricle has no regional  wall motion abnormalities. Left ventricular diastolic function could not  be evaluated.   2. Right ventricular systolic function is normal. The right ventricular  size is normal. There is mildly elevated pulmonary artery systolic  pressure. The estimated right ventricular systolic pressure is 47.4 mmHg.   3. Left atrial size was mildly dilated.   4. Right atrial size was mildly dilated.   5. The mitral valve is normal in structure. Mild mitral valve  regurgitation. No evidence of mitral stenosis.   6. The aortic valve is tricuspid. There is severe calcifcation of the  aortic valve. Aortic valve regurgitation is not visualized. Mild to  moderate aortic valve stenosis. Aortic valve mean gradient measures 16.5  mmHg. Aortic valve Vmax measures 2.66 m/s.   7. The inferior vena cava is normal in size with greater than 50%  respiratory variability, suggesting right atrial pressure of 3 mmHg. \   Heart monitor 01/2022 Patient had a min HR of 63 bpm, max HR of 187 bpm, and avg HR of 83 bpm.  Predominant underlying rhythm was Sinus  Rhythm. Bundle Branch Block/IVCD was present.  22 Ventricular Tachycardia runs occurred, the run with the fastest interval lasting 4 beats with a max rate of 167 bpm, the longest lasting 6 beats with an avg rate of 104 bpm.  24 Supraventricular Tachycardia runs occurred, the run with the fastest interval lasting 14 beats with a max rate of 187 bpm, the longest lasting 20 beats with an avg rate of 111 bpm.  Occasional PACs with a burden of 2.8% and frequent PVCs with a burden of 17%.  R/L heart cath 02/2021    Ost LM to Dist LM lesion is 100% stenosed.   Prox RCA to Mid RCA lesion is 100% stenosed.   Ost RCA to Prox RCA lesion is 90% stenosed.   SVG graft was visualized by angiography and is normal in caliber.   SVG graft was visualized by angiography and is normal in caliber.   LIMA graft  was visualized by angiography and is normal in caliber.   The graft exhibits no disease.   The graft exhibits no disease.   1.  Occluded native vessels including left main and mid right coronary artery.  Patent and normal grafts including SVG to right PDA, SVG to OM 2 and free LIMA to LAD which is anastomosed to the proximal portion of SVG to OM. 2.  Moderate aortic stenosis with mean gradient of 15 mmHg and valve area of 1.1 cm. 3.  Right heart catheterization showed moderately elevated wedge pressure, mild pulmonary hypertension and mildly reduced cardiac output.   Recommendations: Continue medical therapy for coronary artery disease. Aortic stenosis does not require intervention at this point. There is evidence of diastolic heart failure with elevated filling pressures.  We will resume furosemide at 20 mg every other day. The patient reports a lot of calf claudication bilaterally and will evaluate with outpatient Dopplers.  Laboratory Data:  High Sensitivity Troponin:   Recent Labs  Lab 03/23/22 1258  TROPONINIHS 50*     Chemistry Recent Labs  Lab 03/23/22 1258  NA 143  K 4.3  CL 109  CO2 26  GLUCOSE 94  BUN 20  CREATININE 1.29*  CALCIUM 8.5*  GFRNONAA 56*  ANIONGAP 8    Recent Labs  Lab 03/23/22 1258  PROT 7.0  ALBUMIN 3.3*  AST 14*  ALT 13  ALKPHOS 67  BILITOT 0.8   Lipids No results for input(s): "CHOL", "TRIG", "HDL", "LABVLDL", "LDLCALC", "CHOLHDL" in the last 168 hours.  Hematology Recent Labs  Lab 03/23/22 1258  WBC 7.2  RBC 4.28  HGB 12.0*  HCT 39.1  MCV 91.4  MCH 28.0  MCHC 30.7  RDW 16.5*  PLT 276   Thyroid No results for input(s): "TSH", "FREET4" in the last 168 hours.  BNP Recent Labs  Lab 03/23/22 1258  BNP 630.4*    DDimer No results for input(s): "DDIMER" in the last 168 hours.   Radiology/Studies:  DG Chest Port 1 View  Result Date: 03/23/2022 CLINICAL DATA:  Shortness of breath EXAM: PORTABLE CHEST 1 VIEW COMPARISON:   03/09/2022 FINDINGS: Stable heart size status post sternotomy and CABG. Aortic atherosclerosis. Diffuse bilateral interstitial opacities most pronounced in the perihilar and bibasilar regions, left worse than right. No large pleural fluid collection. No pneumothorax. IMPRESSION: Diffuse bilateral interstitial opacities most pronounced in the perihilar and bibasilar regions, left worse than right. Findings may represent pulmonary edema or infection. Electronically Signed   By: Davina Poke D.O.   On: 03/23/2022 13:19  Assessment and Plan:   Acute on chronic diastolic heart failure -Patient sent to the ER from the office for volume overload and shortness of breath requiring 3 L of oxygen.  Patient reports progressive shortness of breath since pneumothorax in October 2023. -In the ER BNP in the 600s, chest x-ray with diffuse bilateral interstitial opacities, likely pulmonary edema, no pneumothorax -Patient is volume overloaded on exam -Echo 02/09/2022 showed LVEF 55 to 60%, no wall motion abnormalities, normal RV systolic function, mild MR -PTA Lasix 20 mg daily -Started on IV Lasix 40 mg twice daily -Continue Coreg -Repeat echo ordered -Monitor kidney function, daily weights, I's and O's with diuresis  Paroxysmal A-fib -Continue warfarin per pharmacy -In sinus rhythm with PVCs  PVCs -Recent ZIO showed 17% PVC burden -Continue Coreg, may tolerate increase of Coreg  Elevated troponin CAD status post remote CABG -Diagnostic catheterization in November 2022 showed 3 out of 3 patent grafts and elevated filling pressures, moderate aortic stenosis by valve area/gradient at that time. -Troponin mildly elevated to 50, now downtrending, not consistent with ACS.  Likely supply demand mismatch in the setting of acute heart failure -Patient denies chest pain -Continue beta-blocker and statin therapy  Moderate aortic stenosis -Diagnostic cath showed moderate aortic stenosis with a gradient of  15 mmHg and a valve area of 1.1 cm. -Most recent echo showed mild to moderate AAS  History of bilateral lower extremity DVTs -Continue chronic warfarin  CKD stage 3 - monitor Scr with diuresis  For questions or updates, please contact [place] Please consult [email or web address] for contact info under    Signed, Keshana Klemz Ninfa Meeker, PA-C  03/23/2022 3:27 PM

## 2022-03-23 NOTE — ED Notes (Signed)
Pt placed on hospital bed

## 2022-03-23 NOTE — ED Notes (Signed)
Pt has +3 pitting edema bilateral legs. RN wrapped pt legs with cotton guaze and ACE wrap up to his thighs.

## 2022-03-23 NOTE — Hospital Course (Addendum)
[name] is a 81 y.o. male with past medical history of COPD with chronic hypoxic respiratory failure not currently smoking (quit >10 years ago), PAfib on coumadin, HFrEF, RAS, CAD s/p CABG, HTN, hypothyroidism, AS, DVT, recent hospitalization about 6 weeks ago for fall with rib fractures, complicated by pneumothorax requiring chest tube placement, diastolic heart failure. He came to ED 03/23/22 from cardiology office w/ Dr Quentin Ore with shortness of breath, LE edema, and fatigue all worsening x6 weeks (since discharge).  Seen by cardiology today outpatient for PVC's follow up, sent to ED concern for acute on chronic diastolic HF w/ significant volume overload. He is having PND episodes multiple times per night.  He has severe lower extremity edema.  Some nights he is unable to sleep at all and sits at the dining room table. 11/29: in ED, soft BP, increased RR at 26, afebrile (+)Afib w/ rate 100. Chest x-ray: Diffuse bilateral opacities consistent with likely edema, less likely infection. CBC unremarkable. BMP unremarkable, renal function at baseline. BNP 630.4. Received IV furosemide 40 mg x1 in ED. Hospitalist service consulted for admission for exacerbation HFpEF.  Per cardiology note earlier today, recommend diuresis of course, repeat echo. Pt admitted to inpatient.  11/30: Net IO Since Admission: -1,500 mL [03/24/22 0814]. Cr slight bump but renal function appropriate. Still on 2-4L [place] and tachypneic. Lung sounds more coarse today, starting tx w/ steroids and nebs for possible COPD, consider repeat imaging if worsening  12/01: tachypnea resolved, BP stable, on 3L O2. Echo resutls: EF 50-55, no RWMA, diastolic parameters indeterminate. Net IO Since Admission: -2,260 mL [03/25/22 0912]. Patient still feeling SOB. Cardiology ok to transition to po diuretics, will monitor through tonight but he has O2 at home if needed and can hopefully discharge tomorrow barring any new worsening / changes       Consultants:  none   Procedures: none      ASSESSMENT & PLAN:   Principal Problem:   Acute exacerbation of HFpEF Active Problems:   CAD (coronary artery disease), native coronary artery   Ischemic cardiomyopathy   Peripheral arterial disease (HCC)   Aortic valve stenosis   HTN (hypertension)   HYPERCHOLESTEROLEMIA   COPD (chronic obstructive pulmonary disease) (HCC)   Hypothyroidism   History of prior cigarette smoking   Bilateral renal artery stenosis (HCC)   Paroxysmal a-fib   Stage 3a chronic kidney disease (CKD) (HCC)   Venous stasis   Heart failure with preserved ejection fraction (HCC)  Acute exacerbation of HFpEF Heart failure with preserved ejection fraction (HCC) Acute Hypoxic Respiratory Failure d/t fluid overload from HFpEF possibly complicated by COPD in pt w/ tobacco history  Ischemic cardiomyopathy CAD (coronary artery disease), native coronary artery Diuresis w/ IV furosemide --> po  Continue statin, beta blocker  Strongly consider ACE/ARB pending renal function  Daily BMP Strict I&O Daily weights  Supplemental O2 as needed Repeat Echo read is pending  Cardiology following   Acute Hypoxic Respiratory Failure d/t fluid overload from HFpEF possibly complicated by COPD in pt w/ tobacco history  Possible COPD exacerbation Solumedrol IV today conver to prednisone po tomorrow  DooNeb scheduled Albuterol prn   HTN (hypertension) Coreg  6.25 mg bid to increase as able  Amlodipine 5 mg daily   HLD Atorvastatin 20 mg daily   Paroxysmal a-fib Telemetry for 24h Continue coumadin for anticoag  Continue carvedilol for rate control   Bilateral renal artery stenosis (HCC) Aortic valve stenosis Peripheral arterial disease (HCC) No s/s ischemia  at this time  Cardiac meds as above Continue Cilostazol   Venous stasis dermatitis  Topical steroids as needed   COPD (chronic obstructive pulmonary disease) ([place]) History of prior cigarette  smoking COPD not thought to be contributing to current SOB  Serial lung exams Supplemental O2 as needed as above  Hypothyroidism Continue Synthroid home dose  Check TSH  Stage 3a chronic kidney disease (CKD) (HCC) Monitor BMP while diuresing   History DVTVTE Coumadin per pharmacy      DVT prophylaxis: Coumadin  Pertinent IV fluids/nutrition: none Central lines / invasive devices: none  Code Status: FULL CODE Family Communication: wife at bedside on rounds   Disposition: inpatient TOC needs: PT/OT pending may need HH  Barriers to discharge / significant pending items: plan d/c tomorrow if no concerning events overinght

## 2022-03-23 NOTE — H&P (Signed)
History and Physical    TREON KEHL [name]:[id number] DOB: 1941-04-05 DOA: 03/23/2022  Referring MD/NP/PA: Dr Ellender Hose in ED  PCP: Owens Loffler, MD  Outpatient Specialists: [facility] Patient coming from: home  Chief Complaint:  Chief Complaint  Patient presents with   Shortness of Breath   HPI: [name] is a 81 y.o. male with past medical history of COPD with chronic hypoxic respiratory failure not currently smoking (quit >10 years ago), PAfib on coumadin, HFrEF, RAS, CAD s/p CABG, HTN, hypothyroidism, AS, DVT, recent hospitalization about 6 weeks ago for fall with rib fractures, complicated by pneumothorax requiring chest tube placement, diastolic heart failure. He came to ED 03/23/22 from cardiology office w/ Dr Quentin Ore with shortness of breath, LE edema, and fatigue all worsening x6 weeks (since discharge).  Seen by cardiology today outpatient for PVC's follow up, sent to ED concern for acute on chronic diastolic HF w/ significant volume overload. He is having PND episodes multiple times per night.  He has severe lower extremity edema.  Some nights he is unable to sleep at all and sits at the dining room table.  Hospital Course:   11/29: in ED, soft BP, increased RR at 26, afebrile (+)Afib w/ rate 100. Chest x-ray: Diffuse bilateral opacities consistent with likely edema, less likely infection. CBC unremarkable. BMP unremarkable, renal function at baseline. BNP 630.4. Received IV furosemide 40 mg x1 in ED. Hospitalist service consulted for admission for exacerbation HFpEF.  Per cardiology note earlier today, recommend diuresis of course, repeat echo. Pt admitted to inpatient.      Consultants:  none   Procedures: none      ASSESSMENT & PLAN:   Principal Problem:   Acute exacerbation of HFpEF Active Problems:   CAD (coronary artery disease), native coronary artery   Ischemic cardiomyopathy   Peripheral arterial disease (HCC)   Aortic valve stenosis   HTN  (hypertension)   HYPERCHOLESTEROLEMIA   COPD (chronic obstructive pulmonary disease) (HCC)   Hypothyroidism   History of prior cigarette smoking   Bilateral renal artery stenosis (HCC)   Paroxysmal a-fib   Stage 3a chronic kidney disease (CKD) (HCC)   Venous stasis   Heart failure with preserved ejection fraction (HCC)  Acute exacerbation of HFpEF Heart failure with preserved ejection fraction (HCC) Acute Hypoxic Respiratory Failure d/t fluid overload from HFpEF Ischemic cardiomyopathy CAD (coronary artery disease), native coronary artery Diuresis w/ IV furosemide Continue statin, beta blocker  Strongly consider ACE/ARB pending renal function  Daily BMP Strict I&O Daily weights  Supplemental O2 as needed Repeat Echo   HTN (hypertension) Coreg  6.25 mg bid to increase as able  Amlodipine 5 mg daily   HLD Atorvastatin 20 mg daily   Paroxysmal a-fib Telemetry for 24h Continue coumadin for anticoag  Continue carvedilol for rate control   Bilateral renal artery stenosis (HCC) Aortic valve stenosis Peripheral arterial disease (HCC) No s/s ischemia at this time  Cardiac meds as above Continue Cilostazol   Venous stasis dermatitis  Topical steroids as needed   COPD (chronic obstructive pulmonary disease) ([place]) History of prior cigarette smoking COPD not thought to be contributing to current SOB  Serial lung exams Supplemental O2 as needed as above  Hypothyroidism Continue Synthroid home dose  Check TSH  Stage 3a chronic kidney disease (CKD) (HCC) Monitor BMP while diuresing   History DVTVTE Coumadin per pharmacy      DVT prophylaxis: Coumadin  Pertinent IV fluids/nutrition: none Central lines / invasive devices: none  Code Status: FULL CODE Family Communication: wife at bedside in ED  Disposition: inpatient TOC needs: none at this time  Barriers to discharge / significant pending items: O2 requirement, pending Echo, diuresing          Review  of Systems: As per HPI otherwise 10 point review of systems negative.    Past Medical History:  Diagnosis Date   ([name]) heart failure with improved ejection fraction ([place])    a. 06/2011 Echo: EF 35-40%; b. 06/2012 Echo: EF 50%; c. 12/2016 Echo: EF 55-60%, no rwma, GRI DD, mild AS/MR, nl RV fxn, nl RVSP; d. 02/2021 Echo: EF 55%, GrII DD. Mild MR. Mild to mod TR. Sev AS by VTI.   AAA (abdominal aortic aneurysm) ([place])    a. 05/2020 Abd u/s: 3.6cm.   Aortic calcification (HCC) 05/01/2013   Bilateral renal artery stenosis ([place])    a. 06/2013 Angio/PTA: LRA: 95 (5x18 Herculink stent), RRA 60ost; b. 04/2021 Angio: mod RRA stenosis w/ patent LRA stent.   Carotid arterial disease ([place])    a. 05/2020 Carotid U/S: RICA 1-61%, LICA 09-60%   Coronary artery disease    a. 2013 s/p CABG x 3 (LIMA->LAD, VG->OM, VG->RPDA; b. 06/2012 MV: no ischemia; c. 02/2021 Cath: LM 100, RCA 90ost, 189m free LIMA->LAD nl, VG->OM2 nl, VG->RPDA nl. Mod AS (mean grad 126mg, AVA 1.1cm^2). RHC w/ elev filling pressures.   History of prior cigarette smoking 04/11/2008   Qualifier: Diagnosis of  By: [name], [name]     Hypertension    Hypothyroidism    Intraventricular hemorrhage ([place]03/20/2014   Ischemic cardiomyopathy    a. 06/2011 Echo: EF 35-40%; b. 06/2012 Echo: EF 50%; c. 12/2016 Echo: EF 55-60%, no rwma, GRI DD; d. 02/2021 Echo: Ef 55%, GrII DD.   Moderate aortic stenosis    a. 02/2021 Echo: EF 55%, GrII DD. Sev Ca2+ of AoV. Sev AS w/ AVA by VTI 0.79cm^2; b. 02/2021 Cath: Mod AS w/ mean grad 1533m. AVA 1.1cm^2.   Peripheral arterial disease ([place]  a. Previous left lower extremity stenting by Dr. SanJamal Collinb. 12/2012 s/p bilat ostial common iliac stenting; c. 04/2021 Angio: Patent LRA stent, mod RRA stenosis. Small AAA. Sev plaque RSFA 2/ subtl occl of inf branch of profunda. Diff Ca2+ SFA dzs w/ collats from profunda and 3V runoff. Diffuse LSFA dzs w/ collats from profunda. Subtl PT dzs. 3V runoff-->Med Rx.   Right leg DVT  (HCC[place]4/21/2014   Subclavian artery stenosis, left ([place]CC[place]2/2014   Status post stenting of the ostium and self-expanding stent placement to the left axillary artery   Venous insufficiency     Past Surgical History:  Procedure Laterality Date   ABDOMINAL ANGIOGRAM  07/10/2013   WITH BI-FEMORAL RUNOFF       DR ARIFletcher AnonABDOMINAL AORTAGRAM N/A 12/26/2012   Procedure: ABDOMINAL AORMaxcine HamSurgeon: MuhWellington HampshireD;  [facility];  Service: Cardiovascular;  Laterality: N/A;   ABDOMINAL AORTAGRAM N/A 07/10/2013   Procedure: ABDOMINAL AORMaxcine HamSurgeon: MuhWellington HampshireD;  [facility];  Service: Cardiovascular;  Laterality: N/A;   ABDOMINAL AORTOGRAM W/LOWER EXTREMITY N/A 05/12/2021   Procedure: ABDOMINAL AORTOGRAM W/LOWER EXTREMITY;  Surgeon: AriWellington HampshireD;  Location: [facility];  Service: Cardiovascular;  Laterality: N/A;   ANGIOPLASTY / STENTING FEMORAL  05/2012   ANGIOPLASTY / STENTING ILIAC Bilateral 12/26/2012   ARCH AORTOGRAM N/A 06/20/2012   Procedure: ARCH AORTOGRAM;  Surgeon:  Wellington Hampshire, MD;  Location: [facility];  Service: Cardiovascular;  Laterality: N/A;   CARDIAC CATHETERIZATION  05/2012   [place]   CHOLECYSTECTOMY  1990's   CORONARY ARTERY BYPASS GRAFT  07/11/2011   Procedure: CORONARY ARTERY BYPASS GRAFTING (CABG);  Surgeon: Grace Isaac, MD;  Location: [facility];  Service: Open Heart Surgery;  Laterality: N/A;  Times 3. On Pump. Using right greater saphenous vein and left internal mammary artery.    CORONARY ARTERY BYPASS GRAFT     HERNIA REPAIR     INSERTION OF ILIAC STENT Bilateral 12/26/2012   Procedure: INSERTION OF ILIAC STENT;  Surgeon: Wellington Hampshire, MD;  Location: [facility] C[facility];  Service: Cardiovascular;  Laterality: Bilateral;  Bilateral Common Iliac Artery   LEFT AND RIGHT HEART CATHETERIZATION WITH CORONARY ANGIOGRAM  07/07/2011   Procedure: LEFT AND RIGHT HEART CATHETERIZATION WITH CORONARY ANGIOGRAM;  Surgeon:  Minna Merritts, MD;  Location: [facility];  Service: Cardiovascular;;   LOWER EXTREMITY VENOGRAPHY Bilateral 12/30/2016   Procedure: Lower Extremity Venography;  Surgeon: Katha Cabal, MD;  Location: [facility];  Service: Cardiovascular;  Laterality: Bilateral;   PERCUTANEOUS STENT INTERVENTION  06/20/2012   Procedure: PERCUTANEOUS STENT INTERVENTION;  Surgeon: Wellington Hampshire, MD;  Location: [facility] CATH LAB;  Service: Cardiovascular;;   RENAL ARTERY ANGIOPLASTY Left 07/10/2013   DR ARIDA   RIGHT/LEFT HEART CATH AND CORONARY/GRAFT ANGIOGRAPHY N/A 03/15/2021   Procedure: RIGHT/LEFT HEART CATH AND CORONARY/GRAFT ANGIOGRAPHY;  Surgeon: Wellington Hampshire, MD;  Location: [facility];  Service: Cardiovascular;  Laterality: N/A;   SUBCLAVIAN ARTERY STENT  05/2012   "2 stents" (12/26/2012)   TOOTH EXTRACTION  Spring 2017   mulitple (6)   VENA CAVA FILTER PLACEMENT  07/2012   Removable     reports that he quit smoking about 11 years ago. His smoking use included cigarettes. He has a 60.00 pack-year smoking history. He has never used smokeless tobacco. He reports that he does not drink alcohol and does not use drugs.  Allergies  Allergen Reactions   Plavix [Clopidogrel Bisulfate]     Brain hemorrhage prev while on plavix and aspirin    Family History  Problem Relation Age of Onset   Alcohol abuse Father    Cirrhosis Father    Hypothyroidism Sister      Prior to Admission medications   Medication Sig Start Date End Date Taking? Authorizing Provider  amLODipine (NORVASC) 5 MG tablet Take 1 tablet (5 mg total) by mouth daily. 02/16/22   Charlynne Cousins, MD  atorvastatin (LIPITOR) 20 MG tablet TAKE 1 TABLET BY MOUTH EVERY DAY 12/24/21   Wellington Hampshire, MD  augmented betamethasone dipropionate (DIPROLENE-AF) 0.05 % cream APPLY TO AFFECTED AREA TWICE A DAY 03/14/22   [provider]  carvedilol (COREG) 6.25 MG tablet TAKE 1 TABLET BY MOUTH TWICE A DAY WITH  MEALS 08/03/21   Copland, Frederico Hamman, MD  cilostazol (PLETAL) 50 MG tablet TAKE 1 TABLET BY MOUTH TWICE A DAY 08/03/21   Wellington Hampshire, MD  furosemide (LASIX) 40 MG tablet Take 0.5 tablets (20 mg total) by mouth as directed. Take 20 mg once daily with extra 20 mg as needed for weight gain of 3 pounds overnight or 5 pounds in one week 02/13/22   Charlynne Cousins, MD  levothyroxine (SYNTHROID) 150 MCG tablet TAKE ONE TABLET BY MOUTH EVERY DAY BEFORE BREAKFAST 08/03/21   Copland, Frederico Hamman, MD  mupirocin ointment (BACTROBAN) 2 %  Apply 1 Application topically daily as needed (rash). 10/20/21   [provider]  psyllium (HYDROCIL/METAMUCIL) 95 % PACK Take 1 packet by mouth daily. 02/12/22   Charlynne Cousins, MD  triamcinolone ointment (KENALOG) 0.1 % APPLY TOPICALLY TO AFFECTED AREAS TWICE A DAY FOR 14 DAYS THEN MAY APPLY 5 DAYS WEEKLY TO AFFECTED AREAS Patient not taking: Reported on 03/23/2022 12/21/21   Ralene Bathe, MD  warfarin (COUMADIN) 5 MG tablet Take 0.5-1 tablets (2.5-5 mg total) by mouth See admin instructions. TAKE 1/2 TABLET DAILY BY MOUTH EXCEPT TAKE 1 TABLET ON MON, WED, FRI OR AS DIRECTED ANTICOAGULATION CLINIC 03/12/21   Owens Loffler, MD    Physical Exam: Vitals:   03/23/22 1430 03/23/22 1500 03/23/22 1530 03/23/22 1600  BP: 113/74 130/61 119/68 (!) 125/97  Pulse: 71 81 76 87  Resp: (!) 25 (!) 31 20 (!) 29  Temp:      SpO2: 96% 92% 94% 94%  Constitutional: NAD, calm, increased WOB Eyes: PERRL, lids and conjunctivae normal ENMT: Mucous membranes are moist. Posterior pharynx clear of any exudate or lesions.Normal dentition.  Neck: normal, supple, no masses, no thyromegaly Respiratory: clear to auscultation bilaterally, no wheezing, no crackles. Normal respiratory effort. No accessory muscle use.  Cardiovascular: Regular rate , no murmurs / rubs / gallops. No extremity edema. 2+ pedal pulses. No carotid bruits.  Abdomen: no tenderness, no masses palpated. No  hepatosplenomegaly. Bowel sounds positive.  Musculoskeletal: no clubbing / cyanosis. No joint deformity upper and lower extremities. Good ROM, no contractures. Normal muscle tone.  Skin: no rashes, lesions, ulcers. No induration Neurologic: CN 2-12 grossly intact. Sensation intact, DTR normal. Strength 5/5 in all 4.  Psychiatric: Normal judgment and insight. Alert and oriented x 3. Normal mood.    Labs on Admission: I have personally reviewed following labs and imaging studies  CBC: Recent Labs  Lab 03/23/22 1258  WBC 7.2  HGB 12.0*  HCT 39.1  MCV 91.4  PLT 951   Basic Metabolic Panel: Recent Labs  Lab 03/23/22 1258  NA 143  K 4.3  CL 109  CO2 26  GLUCOSE 94  BUN 20  CREATININE 1.29*  CALCIUM 8.5*   GFR: Estimated Creatinine Clearance: 44.6 mL/min (A) (by C-G formula based on SCr of 1.29 mg/dL (H)). Liver Function Tests: Recent Labs  Lab 03/23/22 1258  AST 14*  ALT 13  ALKPHOS 67  BILITOT 0.8  PROT 7.0  ALBUMIN 3.3*   No results for input(s): "LIPASE", "AMYLASE" in the last 168 hours. No results for input(s): "AMMONIA" in the last 168 hours. Coagulation Profile: No results for input(s): "INR", "PROTIME" in the last 168 hours. Cardiac Enzymes: No results for input(s): "CKTOTAL", "CKMB", "CKMBINDEX", "TROPONINI" in the last 168 hours. BNP (last 3 results) No results for input(s): "PROBNP" in the last 8760 hours. HbA1C: No results for input(s): "HGBA1C" in the last 72 hours. CBG: No results for input(s): "GLUCAP" in the last 168 hours. Lipid Profile: No results for input(s): "CHOL", "HDL", "LDLCALC", "TRIG", "CHOLHDL", "LDLDIRECT" in the last 72 hours. Thyroid Function Tests: No results for input(s): "TSH", "T4TOTAL", "FREET4", "T3FREE", "THYROIDAB" in the last 72 hours. Anemia Panel: No results for input(s): "VITAMINB12", "FOLATE", "FERRITIN", "TIBC", "IRON", "RETICCTPCT" in the last 72 hours. Urine analysis:    Component Value Date/Time   COLORURINE  YELLOW 03/24/2021 1547   APPEARANCEUR CLEAR 03/24/2021 1547   LABSPEC 1.010 03/24/2021 1547   PHURINE 6.5 03/24/2021 1547   [place] 03/24/2021 1547  HGBUR NEGATIVE 03/24/2021 [place] 03/24/2021 1547   [place] 03/24/2021 1547   PROTEINUR NEGATIVE 12/22/2016 1121   UROBILINOGEN 0.2 03/24/2021 1547   NITRITE NEGATIVE 03/24/2021 1547   LEUKOCYTESUR NEGATIVE 03/24/2021 1547   Sepsis Labs: '@LABRCNTIP'$ (procalcitonin:4,lacticidven:4) ) Recent Results (from the past 240 hour(s))  SARS Coronavirus 2 by RT PCR (hospital order, performed in [facility] hospital lab) *cepheid single result test* Anterior Nasal Swab     Status: None   Collection Time: 03/23/22  2:57 PM   Specimen: Anterior Nasal Swab  Result Value Ref Range Status   SARS Coronavirus 2 by RT PCR NEGATIVE NEGATIVE Final    Comment: (NOTE) SARS-CoV-2 target nucleic acids are NOT DETECTED.  The SARS-CoV-2 RNA is generally detectable in upper and lower respiratory specimens during the acute phase of infection. The lowest concentration of SARS-CoV-2 viral copies this assay can detect is 250 copies / mL. A negative result does not preclude SARS-CoV-2 infection and should not be used as the sole basis for treatment or other patient management decisions.  A negative result may occur with improper specimen collection / handling, submission of specimen other than nasopharyngeal swab, presence of viral mutation(s) within the areas targeted by this assay, and inadequate number of viral copies (<250 copies / mL). A negative result must be combined with clinical observations, patient history, and epidemiological information.  Fact Sheet for Patients:   https://www.patel.info/  Fact Sheet for Healthcare Providers: https://hall.com/  This test is not yet approved or  cleared by the [place] FDA and has been authorized for detection and/or diagnosis  of SARS-CoV-2 by FDA under an Emergency Use Authorization (EUA).  This EUA will remain in effect (meaning this test can be used) for the duration of the COVID-19 declaration under Section 564(b)(1) of the Act, 21 U.S.C. section 360bbb-3(b)(1), unless the authorization is terminated or revoked sooner.  Performed at [facility], [address] [place], [place] [address]      Radiological Exams on Admission: DG Chest [facility] 1 View  Result Date: 03/23/2022 CLINICAL DATA:  Shortness of breath EXAM: PORTABLE CHEST 1 VIEW COMPARISON:  03/09/2022 FINDINGS: Stable heart size status post sternotomy and CABG. Aortic atherosclerosis. Diffuse bilateral interstitial opacities most pronounced in the perihilar and bibasilar regions, left worse than right. No large pleural fluid collection. No pneumothorax. IMPRESSION: Diffuse bilateral interstitial opacities most pronounced in the perihilar and bibasilar regions, left worse than right. Findings may represent pulmonary edema or infection. Electronically Signed   By: Davina Poke D.O.   On: 03/23/2022 13:19    EKG: not available    Severity of Illness: The appropriate patient status for this patient is INPATIENT. Inpatient status is judged to be reasonable and necessary in order to provide the required intensity of service to ensure the patient's safety. The patient's presenting symptoms, physical exam findings, and initial radiographic and laboratory data in the context of their chronic comorbidities is felt to place them at high risk for further clinical deterioration. Furthermore, it is not anticipated that the patient will be medically stable for discharge from the hospital within 2 midnights of admission.   * I certify that at the point of admission it is my clinical judgment that the patient will require inpatient hospital care spanning beyond 2 midnights from the point of admission due to high intensity of service, high risk for further  deterioration and high frequency of surveillance required.Emeterio Reeve DO [facility]   If  7PM-7AM, please contact night-coverage [email or web address]   03/23/2022, 4:41 PM

## 2022-03-23 NOTE — Progress Notes (Signed)
[place] for warfarin Indication: atrial fibrillation  Allergies  Allergen Reactions   Plavix [Clopidogrel Bisulfate]     Brain hemorrhage prev while on plavix and aspirin    Patient Measurements:     Vital Signs: Temp: 98.2 F (36.8 C) (11/29 1649) Temp Source: Oral (11/29 1649) BP: 125/97 (11/29 1600) Pulse Rate: 87 (11/29 1600)  Labs: Recent Labs    03/23/22 1258 03/23/22 1457  HGB 12.0*  --   HCT 39.1  --   PLT 276  --   LABPROT 27.0*  --   INR 2.5*  --   CREATININE 1.29*  --   TROPONINIHS 50* 46*    Estimated Creatinine Clearance: 44.6 mL/min (A) (by C-G formula based on SCr of 1.29 mg/dL (H)).   Medical History: Past Medical History:  Diagnosis Date   (HFimpEF) heart failure with improved ejection fraction ([place])    a. 06/2011 Echo: EF 35-40%; b. 06/2012 Echo: EF 50%; c. 12/2016 Echo: EF 55-60%, no rwma, GRI DD, mild AS/MR, nl RV fxn, nl RVSP; d. 02/2021 Echo: EF 55%, GrII DD. Mild MR. Mild to mod TR. Sev AS by VTI.   AAA (abdominal aortic aneurysm) ([place])    a. 05/2020 Abd u/s: 3.6cm.   Aortic calcification (HCC) 05/01/2013   Bilateral renal artery stenosis ([place])    a. 06/2013 Angio/PTA: LRA: 95 (5x18 Herculink stent), RRA 60ost; b. 04/2021 Angio: mod RRA stenosis w/ patent LRA stent.   Carotid arterial disease ([place])    a. 05/2020 Carotid U/S: RICA 5-63%, LICA 14-97%   Coronary artery disease    a. 2013 s/p CABG x 3 (LIMA->LAD, VG->OM, VG->RPDA; b. 06/2012 MV: no ischemia; c. 02/2021 Cath: LM 100, RCA 90ost, 110m free LIMA->LAD nl, VG->OM2 nl, VG->RPDA nl. Mod AS (mean grad 121mg, AVA 1.1cm^2). RHC w/ elev filling pressures.   History of prior cigarette smoking 04/11/2008   Qualifier: Diagnosis of  By: BeDiona BrownerD, Amy     Hypertension    Hypothyroidism    Intraventricular hemorrhage (HCFruit Cove03/20/2014   Ischemic cardiomyopathy    a. 06/2011 Echo: EF 35-40%; b. 06/2012 Echo: EF 50%; c. 12/2016 Echo: EF 55-60%, no rwma, GRI DD;  d. 02/2021 Echo: Ef 55%, GrII DD.   Moderate aortic stenosis    a. 02/2021 Echo: EF 55%, GrII DD. Sev Ca2+ of AoV. Sev AS w/ AVA by VTI 0.79cm^2; b. 02/2021 Cath: Mod AS w/ mean grad 1561m. AVA 1.1cm^2.   Peripheral arterial disease (HCCKirk  a. Previous left lower extremity stenting by Dr. SanJamal Collinb. 12/2012 s/p bilat ostial common iliac stenting; c. 04/2021 Angio: Patent LRA stent, mod RRA stenosis. Small AAA. Sev plaque RSFA 2/ subtl occl of inf branch of profunda. Diff Ca2+ SFA dzs w/ collats from profunda and 3V runoff. Diffuse LSFA dzs w/ collats from profunda. Subtl PT dzs. 3V runoff-->Med Rx.   Right leg DVT (HCCParsonsburg4/21/2014   Subclavian artery stenosis, left (HCCConyers2/2014   Status post stenting of the ostium and self-expanding stent placement to the left axillary artery   Venous insufficiency     Medications:  PTA warfarin '5mg'$  MWF, 2.'5mg'$  all other days  Assessment: [age] M presents to ED with acute exacerbation of HFpEF with h/o RAS, CAD s/p CABG, HTN, hypothyroidism, AS, DVT   Goal of Therapy:  INR 2-3 Monitor platelets by anticoagulation protocol: Yes   Plan:  --BL INR 2.5 --Give warfarin '5mg'$  x1 --CTM INR and CBC daily and  adjust dose accordingly  Alison Murray 03/23/2022,6:59 PM
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
## 2022-03-24 ENCOUNTER — Inpatient Hospital Stay (HOSPITAL_COMMUNITY)
Admit: 2022-03-24 | Discharge: 2022-03-24 | Disposition: A | Discharge status: Home / Self Care | Payer: Medicare Other | Attending: Osteopathic Medicine | Admitting: Osteopathic Medicine

## 2022-03-24 ENCOUNTER — Ambulatory Visit: Payer: Medicare Other

## 2022-03-24 ENCOUNTER — Inpatient Hospital Stay: Admit: 2022-03-24 | Payer: Medicare Other

## 2022-03-24 ENCOUNTER — Encounter: Payer: Self-pay | Admitting: Osteopathic Medicine

## 2022-03-24 DIAGNOSIS — I5031 Acute diastolic (congestive) heart failure: Secondary | ICD-10-CM

## 2022-03-24 DIAGNOSIS — I2510 Atherosclerotic heart disease of native coronary artery without angina pectoris: Secondary | ICD-10-CM | POA: Diagnosis not present

## 2022-03-24 DIAGNOSIS — Z86718 Personal history of other venous thrombosis and embolism: Secondary | ICD-10-CM | POA: Diagnosis not present

## 2022-03-24 DIAGNOSIS — I5033 Acute on chronic diastolic (congestive) heart failure: Secondary | ICD-10-CM | POA: Diagnosis not present

## 2022-03-24 DIAGNOSIS — I10 Essential (primary) hypertension: Secondary | ICD-10-CM | POA: Diagnosis not present

## 2022-03-24 DIAGNOSIS — I25118 Atherosclerotic heart disease of native coronary artery with other forms of angina pectoris: Secondary | ICD-10-CM | POA: Diagnosis not present

## 2022-03-24 DIAGNOSIS — I350 Nonrheumatic aortic (valve) stenosis: Secondary | ICD-10-CM | POA: Diagnosis not present

## 2022-03-24 LAB — BASIC METABOLIC PANEL
Anion gap: 8 (ref 5–15)
BUN: 22 mg/dL (ref 8–23)
CO2: 28 mmol/L (ref 22–32)
Calcium: 8.9 mg/dL (ref 8.9–10.3)
Chloride: 106 mmol/L (ref 98–111)
Creatinine, Ser: 1.39 mg/dL — ABNORMAL HIGH (ref 0.61–1.24)
GFR, Estimated: 51 mL/min — ABNORMAL LOW (ref >60)
Glucose, Bld: 143 mg/dL — ABNORMAL HIGH (ref 70–99)
Potassium: 4 mmol/L (ref 3.5–5.1)
Sodium: 142 mmol/L (ref 135–145)

## 2022-03-24 LAB — CBC
HCT: 38 % — ABNORMAL LOW (ref 39.0–52.0)
Hemoglobin: 12 g/dL — ABNORMAL LOW (ref 13.0–17.0)
MCH: 28.3 pg (ref 26.0–34.0)
MCHC: 31.6 g/dL (ref 30.0–36.0)
MCV: 89.6 fL (ref 80.0–100.0)
Platelets: 305 10*3/uL (ref 150–400)
RBC: 4.24 MIL/uL (ref 4.22–5.81)
RDW: 16.4 % — ABNORMAL HIGH (ref 11.5–15.5)
WBC: 7.9 10*3/uL (ref 4.0–10.5)
nRBC: 0 % (ref 0.0–0.2)

## 2022-03-24 LAB — ECHOCARDIOGRAM COMPLETE
AR max vel: 1.48 cm2
AV Area VTI: 1.52 cm2
AV Area mean vel: 1.38 cm2
AV Mean grad: 17 mmHg
AV Peak grad: 22.9 mmHg
Ao pk vel: 2.4 m/s
Area-P 1/2: 4.74 cm2
Height: 68.25 in
S' Lateral: 3.9 cm
Weight: 2800 oz

## 2022-03-24 LAB — PROTIME-INR
INR: 2.7 — ABNORMAL HIGH (ref 0.8–1.2)
Prothrombin Time: 28.6 seconds — ABNORMAL HIGH (ref 11.4–15.2)

## 2022-03-24 MED ORDER — WARFARIN SODIUM 2.5 MG PO TABS
2.5000 mg | ORAL_TABLET | Freq: Once | ORAL | Status: AC
  Administered 2022-03-24: 2.5 mg via ORAL
  Filled 2022-03-24 (×2): qty 1

## 2022-03-24 MED ORDER — ALBUTEROL SULFATE (2.5 MG/3ML) 0.083% IN NEBU: 2.5000 mg | INHALATION_SOLUTION | RESPIRATORY_TRACT | Status: DC | PRN

## 2022-03-24 MED ORDER — PREDNISONE 50 MG PO TABS
50.0000 mg | ORAL_TABLET | Freq: Every day | ORAL | Status: DC
  Administered 2022-03-25 – 2022-03-26 (×2): 50 mg via ORAL
  Filled 2022-03-24 (×2): qty 1

## 2022-03-24 MED ORDER — IPRATROPIUM-ALBUTEROL 0.5-2.5 (3) MG/3ML IN SOLN
3.0000 mL | Freq: Four times a day (QID) | RESPIRATORY_TRACT | Status: DC
  Administered 2022-03-24 – 2022-03-26 (×9): 3 mL via RESPIRATORY_TRACT
  Filled 2022-03-24 (×9): qty 3

## 2022-03-24 MED ORDER — METHYLPREDNISOLONE SODIUM SUCC 125 MG IJ SOLR
125.0000 mg | Freq: Once | INTRAMUSCULAR | Status: AC
  Administered 2022-03-24: 125 mg via INTRAVENOUS
  Filled 2022-03-24: qty 2

## 2022-03-24 NOTE — Progress Notes (Signed)
[place] for warfarin Indication: atrial fibrillation  Allergies  Allergen Reactions   Plavix [Clopidogrel Bisulfate]     Brain hemorrhage prev while on plavix and aspirin    Patient Measurements:     Vital Signs: Temp: 97.7 F (36.5 C) (11/30 0937) Temp Source: Oral (11/30 0937) BP: 104/62 (11/30 1200) Pulse Rate: 87 (11/30 1200)  Labs: Recent Labs    03/23/22 1258 03/23/22 1457 03/24/22 0521  HGB 12.0*  --  12.0*  HCT 39.1  --  38.0*  PLT 276  --  305  LABPROT 27.0*  --  28.6*  INR 2.5*  --  2.7*  CREATININE 1.29*  --  1.39*  TROPONINIHS 50* 46*  --      Estimated Creatinine Clearance: 41.4 mL/min (A) (by C-G formula based on SCr of 1.39 mg/dL (H)).   Medical History: Past Medical History:  Diagnosis Date   (HFimpEF) heart failure with improved ejection fraction ([place])    a. 06/2011 Echo: EF 35-40%; b. 06/2012 Echo: EF 50%; c. 12/2016 Echo: EF 55-60%, no rwma, GRI DD, mild AS/MR, nl RV fxn, nl RVSP; d. 02/2021 Echo: EF 55%, GrII DD. Mild MR. Mild to mod TR. Sev AS by VTI.   AAA (abdominal aortic aneurysm) ([place])    a. 05/2020 Abd u/s: 3.6cm.   Aortic calcification (HCC) 05/01/2013   Bilateral renal artery stenosis ([place])    a. 06/2013 Angio/PTA: LRA: 95 (5x18 Herculink stent), RRA 60ost; b. 04/2021 Angio: mod RRA stenosis w/ patent LRA stent.   Carotid arterial disease ([place])    a. 05/2020 Carotid U/S: RICA 7-37%, LICA 10-62%   Coronary artery disease    a. 2013 s/p CABG x 3 (LIMA->LAD, VG->OM, VG->RPDA; b. 06/2012 MV: no ischemia; c. 02/2021 Cath: LM 100, RCA 90ost, 126m free LIMA->LAD nl, VG->OM2 nl, VG->RPDA nl. Mod AS (mean grad 167mg, AVA 1.1cm^2). RHC w/ elev filling pressures.   History of prior cigarette smoking 04/11/2008   Qualifier: Diagnosis of  By: BeDiona BrownerD, Amy     Hypertension    Hypothyroidism    Intraventricular hemorrhage (HCBreckenridge03/20/2014   Ischemic cardiomyopathy    a. 06/2011 Echo: EF 35-40%; b. 06/2012  Echo: EF 50%; c. 12/2016 Echo: EF 55-60%, no rwma, GRI DD; d. 02/2021 Echo: Ef 55%, GrII DD.   Moderate aortic stenosis    a. 02/2021 Echo: EF 55%, GrII DD. Sev Ca2+ of AoV. Sev AS w/ AVA by VTI 0.79cm^2; b. 02/2021 Cath: Mod AS w/ mean grad 1526m. AVA 1.1cm^2.   Peripheral arterial disease (HCCLake City  a. Previous left lower extremity stenting by Dr. SanJamal Collinb. 12/2012 s/p bilat ostial common iliac stenting; c. 04/2021 Angio: Patent LRA stent, mod RRA stenosis. Small AAA. Sev plaque RSFA 2/ subtl occl of inf branch of profunda. Diff Ca2+ SFA dzs w/ collats from profunda and 3V runoff. Diffuse LSFA dzs w/ collats from profunda. Subtl PT dzs. 3V runoff-->Med Rx.   Right leg DVT (HCCLaird4/21/2014   Subclavian artery stenosis, left (HCCChand[place]2/2014   Status post stenting of the ostium and self-expanding stent placement to the left axillary artery   Venous insufficiency     Medications:  PTA warfarin '5mg'$  MWF, 2.'5mg'$  all other days  Assessment: [age] M presents to ED with acute exacerbation of HFpEF with h/o RAS, CAD s/p CABG, HTN, hypothyroidism, AS, DVT Baseline Labs: Hgb 12.0, Hct 39.1, Plts 276, INR 2.5   Goal of Therapy:  INR 2-3 Monitor  platelets by anticoagulation protocol: Yes  11/29   INR 2.5   Warfarin 5 mg 11/30   INR 2.7      Plan:  --INR 2.7 --Give warfarin 2.5 mg x 1 (following home schedule) --CTM INR  --CBC at least weekly  Alison Murray 03/24/2022,12:21 PM

## 2022-03-24 NOTE — Progress Notes (Signed)
*  PRELIMINARY RESULTS* Echocardiogram 2D Echocardiogram has been performed.  Wallie Char Madalene Mickler 03/24/2022, 11:54 AM

## 2022-03-24 NOTE — ED Notes (Signed)
Pt assisted back to bed.  Gait steady.

## 2022-03-24 NOTE — ED Notes (Signed)
Pt assisted to ambulate to in room toilet.  Gait steady. Instructed to use pull cord when finished for assistance ambulating back to bed.

## 2022-03-24 NOTE — Consult Note (Addendum)
   Heart Failure Nurse Navigator Note  HFpEF 55-60%.  Mildly elevated pulmonary artery systolic pressures.  Mild biatrial enlargement.  Mild mitral regurgitation.  Mild to moderate aortic stenosis.  Echocardiogram to be performed on this admission the results are pending.  Presented to the emergency room from Dr. Mardene Speak office due to increasing shortness of breath, lower extremity edema, fatigue, PND duration of about 6 weeks.  BNP 630.  Chest x-ray showed bilateral opacities  Comorbidities:  COPD Paroxysmal atrial fibrillation Renal artery stenosis Coronary artery disease/coronary artery bypass grafting Hypothyroidism Aortic valve stenosis DVT  Zio patch revealed a 17% burden of PVCs.  Medications:  Coreg 6.25 mg twice a day Furosemide 40 mg IV twice daily Warfarin dosing per the pharmacist Levothyroxine 150 mcg daily  Amlodipine 5 mg daily currently on hold.  Labs:  Sodium 142, potassium 4, chloride 106, CO2 28, BUN 22, creatinine 1.39 hemoglobin 12, hematocrit 38. Weight is 79.4 kg Blood pressure 104/62  Initial meeting with patient in the ED, is also present and does most of the talking due to the patient's continued shortness of breath.  Discussed  heart failure and what it means.  Patient states he is familiar with the term.  States that he lives at home with his wife.  They have 4 grown children help with transportation as wife is legally blind and has not driven for 20 years.  States that he does not weigh himself on a daily basis, explained the rationale for daily eights and reporting a 2 pound weight gain overnight or total of 5 pounds within the week or changes in symptoms such as increasing shortness of breath at rest and with activity, PND, orthopnea, increasing abdominal girth and lower extremity edema.  He voices understanding.  Went over the importance of fluid restriction of no more than 64 ounces in a days time.  His wife states that he is one that is  constantly filling up his coffee cup, he unsure as to how much coffee he is drinking daily, he does not drink sodas.  Discussed sodium restriction, they may eat at a restaurant once a week at home they try to adhere to a low-sodium diet with no processed foods no use of salt.  Made aware of follow-up in the outpatient heart failure clinic, has an appointment on December 6 at 9:30 in the morning.  They were given the living with heart failure teaching booklet, zone magnet, info on low-sodium and heart failure along with weight chart.  Continue to follow along.  Pricilla Riffle RN [facility]

## 2022-03-24 NOTE — Progress Notes (Addendum)
Rounding Note    Patient Name: [name] Date of Encounter: 03/24/2022  [place] Cardiologist: [name], MD   Subjective   UOP -1.5L. Patient reports breathing is still short, still requiring 2LO2. Lower leg edema is better. No chest pain. BP is soft.kidney function stable.  Inpatient Medications    Scheduled Meds:  atorvastatin  20 mg Oral Daily   carvedilol  6.25 mg Oral BID WC   cilostazol  50 mg Oral BID   furosemide  40 mg Intravenous BID   ipratropium-albuterol  3 mL Nebulization Q6H   levothyroxine  150 mcg Oral Q0600   [START ON 03/25/2022] predniSONE  50 mg Oral Q breakfast   psyllium  1 packet Oral Daily   sodium chloride flush  3 mL Intravenous Q12H   triamcinolone cream  1 Application Topical BID   Warfarin - Pharmacist Dosing Inpatient   Does not apply q1600   Continuous Infusions:  sodium chloride     PRN Meds: sodium chloride, acetaminophen, albuterol, mupirocin ointment, ondansetron (ZOFRAN) IV, sodium chloride flush   Vital Signs    Vitals:   03/24/22 0534 03/24/22 0730 03/24/22 0830 03/24/22 0937  BP: 119/66 (!) 120/53 (!) 100/52   Pulse: 88 70 97   Resp: (!) 28 (!) 29 (!) 27   Temp: (!) 97.5 F (36.4 C)   97.7 F (36.5 C)  TempSrc: Axillary   Oral  SpO2: 97% 95% 96%     Intake/Output Summary (Last 24 hours) at 03/24/2022 0954 Last data filed at 03/23/2022 2343 Gross per 24 hour  Intake --  Output 1500 ml  Net -1500 ml      03/23/2022   11:37 AM 02/16/2022    2:03 PM 02/06/2022    3:08 PM  Last 3 Weights  Weight (lbs) 175 lb 179 lb 4 oz 162 lb 15.8 oz  Weight (kg) 79.379 kg 81.307 kg 73.93 kg      Telemetry    NSR HR 80-90s, frequent PVCs - Personally Reviewed  ECG    No new - Personally Reviewed  Physical Exam   GEN: No acute distress.   Neck: +JVD Cardiac: RRR, no murmurs, rubs, or gallops.  Respiratory: decreased breath sounds, wheezing, crackles. GI: Soft, nontender, non-distended  MS:  pedal edema; No deformity. Neuro:  Nonfocal  Psych: Normal affect   Labs    High Sensitivity Troponin:   Recent Labs  Lab 03/23/22 1258 03/23/22 1457  TROPONINIHS 50* 46*     Chemistry Recent Labs  Lab 03/23/22 1258 03/24/22 0521  NA 143 142  K 4.3 4.0  CL 109 106  CO2 26 28  GLUCOSE 94 143*  BUN 20 22  CREATININE 1.29* 1.39*  CALCIUM 8.5* 8.9  PROT 7.0  --   ALBUMIN 3.3*  --   AST 14*  --   ALT 13  --   ALKPHOS 67  --   BILITOT 0.8  --   GFRNONAA 56* 51*  ANIONGAP 8 8    Lipids No results for input(s): "CHOL", "TRIG", "HDL", "LABVLDL", "LDLCALC", "CHOLHDL" in the last 168 hours.  Hematology Recent Labs  Lab 03/23/22 1258 03/24/22 0521  WBC 7.2 7.9  RBC 4.28 4.24  HGB 12.0* 12.0*  HCT 39.1 38.0*  MCV 91.4 89.6  MCH 28.0 28.3  MCHC 30.7 31.6  RDW 16.5* 16.4*  PLT 276 305   Thyroid No results for input(s): "TSH", "FREET4" in the last 168 hours.  BNP Recent Labs  Lab  03/23/22 1258  BNP 630.4*    DDimer No results for input(s): "DDIMER" in the last 168 hours.   Radiology    DG Chest Port 1 View  Result Date: 03/23/2022 CLINICAL DATA:  Shortness of breath EXAM: PORTABLE CHEST 1 VIEW COMPARISON:  03/09/2022 FINDINGS: Stable heart size status post sternotomy and CABG. Aortic atherosclerosis. Diffuse bilateral interstitial opacities most pronounced in the perihilar and bibasilar regions, left worse than right. No large pleural fluid collection. No pneumothorax. IMPRESSION: Diffuse bilateral interstitial opacities most pronounced in the perihilar and bibasilar regions, left worse than right. Findings may represent pulmonary edema or infection. Electronically Signed   By: Davina Poke D.O.   On: 03/23/2022 13:19    Cardiac Studies    Echo 02/09/22 1. Left ventricular ejection fraction, by estimation, is 55 to 60%. The  left ventricle has normal function. The left ventricle has no regional  wall motion abnormalities. Left ventricular diastolic  function could not  be evaluated.   2. Right ventricular systolic function is normal. The right ventricular  size is normal. There is mildly elevated pulmonary artery systolic  pressure. The estimated right ventricular systolic pressure is 16.1 mmHg.   3. Left atrial size was mildly dilated.   4. Right atrial size was mildly dilated.   5. The mitral valve is normal in structure. Mild mitral valve  regurgitation. No evidence of mitral stenosis.   6. The aortic valve is tricuspid. There is severe calcifcation of the  aortic valve. Aortic valve regurgitation is not visualized. Mild to  moderate aortic valve stenosis. Aortic valve mean gradient measures 16.5  mmHg. Aortic valve Vmax measures 2.66 m/s.   7. The inferior vena cava is normal in size with greater than 50%  respiratory variability, suggesting right atrial pressure of 3 mmHg. \    Heart monitor 01/2022 Patient had a min HR of 63 bpm, max HR of 187 bpm, and avg HR of 83 bpm.  Predominant underlying rhythm was Sinus Rhythm. Bundle Branch Block/IVCD was present.  22 Ventricular Tachycardia runs occurred, the run with the fastest interval lasting 4 beats with a max rate of 167 bpm, the longest lasting 6 beats with an avg rate of 104 bpm.  24 Supraventricular Tachycardia runs occurred, the run with the fastest interval lasting 14 beats with a max rate of 187 bpm, the longest lasting 20 beats with an avg rate of 111 bpm.  Occasional PACs with a burden of 2.8% and frequent PVCs with a burden of 17%.   R/L heart cath 02/2021    Ost LM to Dist LM lesion is 100% stenosed.   Prox RCA to Mid RCA lesion is 100% stenosed.   Ost RCA to Prox RCA lesion is 90% stenosed.   SVG graft was visualized by angiography and is normal in caliber.   SVG graft was visualized by angiography and is normal in caliber.   LIMA graft was visualized by angiography and is normal in caliber.   The graft exhibits no disease.   The graft exhibits no disease.   1.   Occluded native vessels including left main and mid right coronary artery.  Patent and normal grafts including SVG to right PDA, SVG to OM 2 and free LIMA to LAD which is anastomosed to the proximal portion of SVG to OM. 2.  Moderate aortic stenosis with mean gradient of 15 mmHg and valve area of 1.1 cm. 3.  Right heart catheterization showed moderately elevated wedge pressure, mild pulmonary hypertension  and mildly reduced cardiac output.   Recommendations: Continue medical therapy for coronary artery disease. Aortic stenosis does not require intervention at this point. There is evidence of diastolic heart failure with elevated filling pressures.  We will resume furosemide at 20 mg every other day. The patient reports a lot of calf claudication bilaterally and will evaluate with outpatient Dopplers.  Patient Profile     81 y.o. male ith a hx of peripheral arterial disease, renal artery stenosis, carotid arterial disease, tobacco abuse, COPD, abdominal aortic aneurysm, intracranial hemorrhage in the setting of malignant hypertension, hyperlipidemia, bilateral lower extremity DVTs, heart failure with improved EF/ischemic cardiomyopathy, CAD status post CABG x 4 in March 2013 who is being seen 03/23/2022 for the evaluation of volume overload.  Assessment & Plan    Acute on chronic diastolic heart failure -Patient sent to the ER from the office 11/29 for volume overload and shortness of breath requiring 3 L of oxygen.  Patient reports progressive shortness of breath since pneumothorax in October 2023. -In the ER BNP in the 600s, chest x-ray with diffuse bilateral interstitial opacities, likely pulmonary edema, no pneumothorax -Echo 02/09/2022 showed LVEF 55 to 60%, no wall motion abnormalities, normal RV systolic function, mild MR -PTA Lasix 20 mg daily -Started on IV Lasix 40 mg twice daily - UOP -1.5L - volume status is improving, now on 2L O2 - repeat echo ordered -Continue Coreg, monitor  BP with soft pressures -Monitor kidney function, daily weights, I's and O's with diuresis   Paroxysmal A-fib -Continue warfarin per pharmacy -In sinus rhythm with PVCs   PVCs -Recent ZIO showed 17% PVC burden -Continue Coreg, may tolerate increase of Coreg   Elevated troponin CAD status post remote CABG -Diagnostic catheterization in November 2022 showed 3 out of 3 patent grafts and elevated filling pressures, moderate aortic stenosis by valve area/gradient at that time. -Troponin mildly elevated to 50, now downtrending, not consistent with ACS.  Likely supply demand mismatch in the setting of acute heart failure -Patient denies chest pain -Continue beta-blocker and statin therapy   Moderate aortic stenosis -Diagnostic cath showed moderate aortic stenosis with a gradient of 15 mmHg and a valve area of 1.1 cm. -Most recent echo showed mild to moderate AAS   History of bilateral lower extremity DVTs -Continue chronic warfarin   CKD stage 3 - monitor Scr with diuresis  For questions or updates, please contact [place] Please consult [email or web address] for contact info under        Signed, Cadence Ninfa Meeker, PA-C  03/24/2022, 9:54 AM       Attending Note Patient seen and examined, agree with detailed note above,   Patient presentation and plan discussed on rounds.    EKG lab work, chest x-ray, echocardiogram reviewed independently by myself  Reports significant cough, congestion, shortness of breath today, seem to be exacerbated by nebulizer treatment Very wheezy , resting comfortably in bed Responding well to Lasix, good urine output -1.5 L past 24 hours  On examination : alert oriented,  JVD 8+, lungs coarse breath sounds, wheezing, dullness at the bases heart sounds irregularly irregular no murmurs appreciated, abdomen soft nontender trace-1+ bilateral lower extremity edema.  Musculoskeletal exam with good range of motion, neurologic exam grossly nonfocal  Lab  work reviewed Sodium 142 potassium 4.0 creatinine 1.39 BUN 22 WBC 7.9 hemoglobin 12  A/P: Acute on chronic diastolic CHF To the emergency room from cardiology office visit March 23, 2022 given worsening lower extremity edema, increasing shortness  of breath requiring 3 L oxygen Echocardiogram confirming ejection fraction 55 to 60% -Monday well to IV Lasix 40 twice daily .  M echocardiogram images reviewed again confirming normal left ventricular function Will likely need diuresis through the weekend CHF symptoms likely exacerbated by aortic valve stenosis, disease, atrial fibrillation  Coronary disease with stable angina history of CABG Catheterization November 2022 3 of 3 grafts with patent pressures and moderate aortic valve stenosis at that time No plan for ischemic work-up this admission  Aortic valve stenosis, previously noted to be moderate, Repeat echocardiogram pending  History of DVTs lower extremity On chronic warfarin  PVCs 19% burden on Zio monitor, Titration of carvedilol limited by hypotension Has seen EP, some discussion of adding mexiletine  COPD exacerbation Significant wheezing, coughing Consider course of steroids, nebs, inhalers   Greater than 50% was spent in counseling and coordination of care with patient Total encounter time 50 minutes or more   Signed: Esmond Plants  M.D., Ph.D. [facility]

## 2022-03-24 NOTE — Addendum Note (Signed)
Addended by: James Ivanoff D on: 03/24/2022 04:18 PM   Modules accepted: Orders

## 2022-03-24 NOTE — Progress Notes (Signed)
PROGRESS NOTE    [name]   UVO:[id number] DOB: Mar 26, 1941  DOA: 03/23/2022 Date of Service: 03/24/22 PCP: Owens Loffler, MD     Brief Narrative / Hospital Course:  [name] is a 81 y.o. male with past medical history of COPD with chronic hypoxic respiratory failure not currently smoking (quit >10 years ago), PAfib on coumadin, HFrEF, RAS, CAD s/p CABG, HTN, hypothyroidism, AS, DVT, recent hospitalization about 6 weeks ago for fall with rib fractures, complicated by pneumothorax requiring chest tube placement, diastolic heart failure. He came to ED 03/23/22 from cardiology office w/ Dr Quentin Ore with shortness of breath, LE edema, and fatigue all worsening x6 weeks (since discharge).  Seen by cardiology today outpatient for PVC's follow up, sent to ED concern for acute on chronic diastolic HF w/ significant volume overload. He is having PND episodes multiple times per night.  He has severe lower extremity edema.  Some nights he is unable to sleep at all and sits at the dining room table. 11/29: in ED, soft BP, increased RR at 26, afebrile (+)Afib w/ rate 100. Chest x-ray: Diffuse bilateral opacities consistent with likely edema, less likely infection. CBC unremarkable. BMP unremarkable, renal function at baseline. BNP 630.4. Received IV furosemide 40 mg x1 in ED. Hospitalist service consulted for admission for exacerbation HFpEF.  Per cardiology note earlier today, recommend diuresis of course, repeat echo. Pt admitted to inpatient.  11/30: Net IO Since Admission: -1,500 mL [03/24/22 0814]. Cr slight bump but renal function appropriate. Still on 2L [place] and tachypneic. Lung sounds more coarse today, starting tx w/ steroids and nebs for possible COPD, consider repeat imaging if worsening      Consultants:  none   Procedures: none      ASSESSMENT & PLAN:   Principal Problem:   Acute exacerbation of HFpEF Active Problems:   CAD (coronary artery disease), native coronary  artery   Ischemic cardiomyopathy   Peripheral arterial disease (HCC)   Aortic valve stenosis   HTN (hypertension)   HYPERCHOLESTEROLEMIA   COPD (chronic obstructive pulmonary disease) (HCC)   Hypothyroidism   History of prior cigarette smoking   Bilateral renal artery stenosis (HCC)   Paroxysmal a-fib   Stage 3a chronic kidney disease (CKD) (HCC)   Venous stasis   Heart failure with preserved ejection fraction (HCC)  Acute exacerbation of HFpEF Heart failure with preserved ejection fraction (HCC) Acute Hypoxic Respiratory Failure d/t fluid overload from HFpEF possibly complicated by COPD in pt w/ tobacco history  Ischemic cardiomyopathy CAD (coronary artery disease), native coronary artery Diuresis w/ IV furosemide Continue statin, beta blocker  Strongly consider ACE/ARB pending renal function  Daily BMP Strict I&O Daily weights  Supplemental O2 as needed Repeat Echo read is pending  Cardiology following   Acute Hypoxic Respiratory Failure d/t fluid overload from HFpEF possibly complicated by COPD in pt w/ tobacco history  Possible COPD exacerbation Solumedrol IV today conver to prednisone po tomorrow  DooNeb scheduled Albuterol prn   HTN (hypertension) Coreg  6.25 mg bid to increase as able  Amlodipine 5 mg daily   HLD Atorvastatin 20 mg daily   Paroxysmal a-fib Telemetry for 24h Continue coumadin for anticoag  Continue carvedilol for rate control   Bilateral renal artery stenosis (HCC) Aortic valve stenosis Peripheral arterial disease (HCC) No s/s ischemia at this time  Cardiac meds as above Continue Cilostazol   Venous stasis dermatitis  Topical steroids as needed   COPD (chronic obstructive pulmonary disease) ([place]) History  of prior cigarette smoking COPD not thought to be contributing to current SOB  Serial lung exams Supplemental O2 as needed as above  Hypothyroidism Continue Synthroid home dose  Check TSH  Stage 3a chronic kidney disease (CKD)  (HCC) Monitor BMP while diuresing   History DVTVTE Coumadin per pharmacy      DVT prophylaxis: Coumadin  Pertinent IV fluids/nutrition: none Central lines / invasive devices: none  Code Status: FULL CODE Family Communication: wife at bedside in ED  Disposition: inpatient TOC needs: none at this time  Barriers to discharge / significant pending items: O2 requirement, pending Echo, diuresing                Subjective:  Patient reports still SOB but better than yesterday Denies CP.  Pain controlled.  Denies new weakness.  Tolerating diet.  Reports no concerns w/ urination/defecation.   Family Communication: none at this time, pt is alert and in communication w/ his family.     Objective Findings:  Vitals:   03/24/22 0730 03/24/22 0830 03/24/22 0937 03/24/22 1200  BP: (!) 120/53 (!) 100/52  104/62  Pulse: 70 97  87  Resp: (!) 29 (!) 27  20  Temp:   97.7 F (36.5 C)   TempSrc:   Oral   SpO2: 95% 96%  95%    Intake/Output Summary (Last 24 hours) at 03/24/2022 1345 Last data filed at 03/23/2022 2343 Gross per 24 hour  Intake --  Output 1500 ml  Net -1500 ml   There were no vitals filed for this visit.  Examination: Constitutional:  VS as above General Appearance: alert, well-developed, well-nourished, NAD Respiratory: Normal respiratory effort +scattered wheeze No rhonchi +bibasilar rales Cardiovascular: S1/S2 normal No murmur No rub/gallop auscultated No lower extremity edema Gastrointestinal: No tenderness No masses No hernia appreciated Musculoskeletal:  No clubbing/cyanosis of digits Symmetrical movement in all extremities Neurological: No cranial nerve deficit on limited exam Alert Psychiatric: Normal judgment/insight Normal mood and affect       Scheduled Medications:   atorvastatin  20 mg Oral Daily   carvedilol  6.25 mg Oral BID WC   cilostazol  50 mg Oral BID   furosemide  40 mg Intravenous BID    ipratropium-albuterol  3 mL Nebulization Q6H   levothyroxine  150 mcg Oral Q0600   [START ON 03/25/2022] predniSONE  50 mg Oral Q breakfast   psyllium  1 packet Oral Daily   sodium chloride flush  3 mL Intravenous Q12H   triamcinolone cream  1 Application Topical BID   warfarin  2.5 mg Oral ONCE-1600   Warfarin - Pharmacist Dosing Inpatient   Does not apply q1600    Continuous Infusions:  sodium chloride      PRN Medications:  sodium chloride, acetaminophen, albuterol, mupirocin ointment, ondansetron (ZOFRAN) IV, sodium chloride flush  Antimicrobials:  Anti-infectives (From admission, onward)    None           Data Reviewed: I have personally reviewed following labs and imaging studies  CBC: Recent Labs  Lab 03/23/22 1258 03/24/22 0521  WBC 7.2 7.9  HGB 12.0* 12.0*  HCT 39.1 38.0*  MCV 91.4 89.6  PLT 276 354   Basic Metabolic Panel: Recent Labs  Lab 03/23/22 1258 03/24/22 0521  NA 143 142  K 4.3 4.0  CL 109 106  CO2 26 28  GLUCOSE 94 143*  BUN 20 22  CREATININE 1.29* 1.39*  CALCIUM 8.5* 8.9   GFR: Estimated Creatinine Clearance: 41.4 mL/min (A) (  by C-G formula based on SCr of 1.39 mg/dL (H)). Liver Function Tests: Recent Labs  Lab 03/23/22 1258  AST 14*  ALT 13  ALKPHOS 67  BILITOT 0.8  PROT 7.0  ALBUMIN 3.3*   No results for input(s): "LIPASE", "AMYLASE" in the last 168 hours. No results for input(s): "AMMONIA" in the last 168 hours. Coagulation Profile: Recent Labs  Lab 03/23/22 1258 03/24/22 0521  INR 2.5* 2.7*   Cardiac Enzymes: No results for input(s): "CKTOTAL", "CKMB", "CKMBINDEX", "TROPONINI" in the last 168 hours. BNP (last 3 results) No results for input(s): "PROBNP" in the last 8760 hours. HbA1C: No results for input(s): "HGBA1C" in the last 72 hours. CBG: No results for input(s): "GLUCAP" in the last 168 hours. Lipid Profile: No results for input(s): "CHOL", "HDL", "LDLCALC", "TRIG", "CHOLHDL", "LDLDIRECT" in the last  72 hours. Thyroid Function Tests: No results for input(s): "TSH", "T4TOTAL", "FREET4", "T3FREE", "THYROIDAB" in the last 72 hours. Anemia Panel: No results for input(s): "VITAMINB12", "FOLATE", "FERRITIN", "TIBC", "IRON", "RETICCTPCT" in the last 72 hours. Most Recent Urinalysis On File:     Component Value Date/Time   COLORURINE YELLOW 03/24/2021 [place] 03/24/2021 1547   LABSPEC 1.010 03/24/2021 1547   PHURINE 6.5 03/24/2021 1547   GLUCOSEU NEGATIVE 03/24/2021 1547   HGBUR NEGATIVE 03/24/2021 1547   BILIRUBINUR NEGATIVE 03/24/2021 1547   KETONESUR NEGATIVE 03/24/2021 1547   PROTEINUR NEGATIVE 12/22/2016 1121   UROBILINOGEN 0.2 03/24/2021 1547   NITRITE NEGATIVE 03/24/2021 1547   LEUKOCYTESUR NEGATIVE 03/24/2021 1547   Sepsis Labs: '@LABRCNTIP'$ (procalcitonin:4,lacticidven:4)  Recent Results (from the past 240 hour(s))  SARS Coronavirus 2 by RT PCR (hospital order, performed in [place] hospital lab) *cepheid single result test* Anterior Nasal Swab     Status: None   Collection Time: 03/23/22  2:57 PM   Specimen: Anterior Nasal Swab  Result Value Ref Range Status   SARS Coronavirus 2 by RT PCR NEGATIVE NEGATIVE Final    Comment: (NOTE) SARS-CoV-2 target nucleic acids are NOT DETECTED.  The SARS-CoV-2 RNA is generally detectable in upper and lower respiratory specimens during the acute phase of infection. The lowest concentration of SARS-CoV-2 viral copies this assay can detect is 250 copies / mL. A negative result does not preclude SARS-CoV-2 infection and should not be used as the sole basis for treatment or other patient management decisions.  A negative result may occur with improper specimen collection / handling, submission of specimen other than nasopharyngeal swab, presence of viral mutation(s) within the areas targeted by this assay, and inadequate number of viral copies (<250 copies / mL). A negative result must be combined with  clinical observations, patient history, and epidemiological information.  Fact Sheet for Patients:   https://www.patel.info/  Fact Sheet for Healthcare Providers: https://hall.com/  This test is not yet approved or  cleared by the [place] FDA and has been authorized for detection and/or diagnosis of SARS-CoV-2 by FDA under an Emergency Use Authorization (EUA).  This EUA will remain in effect (meaning this test can be used) for the duration of the COVID-19 declaration under Section 564(b)(1) of the Act, 21 U.S.C. section 360bbb-3(b)(1), unless the authorization is terminated or revoked sooner.  Performed at [facility], [address], [place], [place] [address]          Radiology Studies: No results found.          LOS: 1 day      Emeterio Reeve, DO [facility] 03/24/2022, 1:45 PM  Dictation software may have been used to generate the above note. Typos may occur and escape review in typed/dictated notes. Please contact Dr Sheppard Coil directly for clarity if needed.  Staff may message me via secure chat in [place]  but this may not receive an immediate response,  please page me for urgent matters!  If 7PM-7AM, please contact night coverage [email or web address]

## 2022-03-24 NOTE — Care Management Important Message (Signed)
Important Message  Patient Details  Name: DESMON HITCHNER MRN: [id number] Date of Birth: December 27, 1940   Medicare Important Message Given:  N/A - LOS <3 / Initial given by admissions     Dannette Barbara 03/24/2022, 5:16 PM

## 2022-03-24 NOTE — Discharge Instructions (Signed)

## 2022-03-24 NOTE — ED Notes (Signed)
Pt given hot tea per his request.

## 2022-03-25 ENCOUNTER — Other Ambulatory Visit (HOSPITAL_COMMUNITY): Payer: Self-pay

## 2022-03-25 DIAGNOSIS — I5033 Acute on chronic diastolic (congestive) heart failure: Secondary | ICD-10-CM | POA: Diagnosis not present

## 2022-03-25 LAB — PROTIME-INR
INR: 3.8 — ABNORMAL HIGH (ref 0.8–1.2)
Prothrombin Time: 37.2 seconds — ABNORMAL HIGH (ref 11.4–15.2)

## 2022-03-25 LAB — BASIC METABOLIC PANEL
Anion gap: 7 (ref 5–15)
BUN: 30 mg/dL — ABNORMAL HIGH (ref 8–23)
CO2: 28 mmol/L (ref 22–32)
Calcium: 8.7 mg/dL — ABNORMAL LOW (ref 8.9–10.3)
Chloride: 109 mmol/L (ref 98–111)
Creatinine, Ser: 1.43 mg/dL — ABNORMAL HIGH (ref 0.61–1.24)
GFR, Estimated: 50 mL/min — ABNORMAL LOW (ref >60)
Glucose, Bld: 117 mg/dL — ABNORMAL HIGH (ref 70–99)
Potassium: 3.5 mmol/L (ref 3.5–5.1)
Sodium: 144 mmol/L (ref 135–145)

## 2022-03-25 MED ORDER — FUROSEMIDE 20 MG PO TABS: 20.0000 mg | ORAL_TABLET | Freq: Every day | ORAL | 0 refills | Status: DC

## 2022-03-25 MED ORDER — ALBUTEROL SULFATE HFA 108 (90 BASE) MCG/ACT IN AERS: 2.0000 | INHALATION_SPRAY | Freq: Four times a day (QID) | RESPIRATORY_TRACT | 0 refills | Status: DC | PRN

## 2022-03-25 MED ORDER — ANORO ELLIPTA 62.5-25 MCG/ACT IN AEPB: 1.0000 | INHALATION_SPRAY | Freq: Every day | RESPIRATORY_TRACT | 0 refills | Status: DC

## 2022-03-25 MED ORDER — FUROSEMIDE 40 MG PO TABS
40.0000 mg | ORAL_TABLET | Freq: Every day | ORAL | Status: DC
  Administered 2022-03-26: 40 mg via ORAL
  Filled 2022-03-25: qty 1

## 2022-03-25 MED ORDER — PREDNISONE 50 MG PO TABS: 50.0000 mg | ORAL_TABLET | Freq: Every day | ORAL | 0 refills | Status: DC

## 2022-03-25 NOTE — Progress Notes (Signed)
   Heart Failure Nurse Navigator Note  Met with patient and his wife who was at the bedside.  By teach back method went over daily weights, what to report, fluid restriction etc. no reinforcement needed.  It was asking what atrial relation is.  Explained atrial fibrillation to him and his wife they voiced understanding.  They have no further questions.  He is thinking that he will probably be discharged home today.  Pricilla Riffle RN [facility]

## 2022-03-25 NOTE — Progress Notes (Signed)
PROGRESS NOTE    [name]   DVV:[id number] DOB: 1941-04-23  DOA: 03/23/2022 Date of Service: 03/25/22 PCP: Owens Loffler, MD     Brief Narrative / Hospital Course:  [name] is a 81 y.o. male with past medical history of COPD with chronic hypoxic respiratory failure not currently smoking (quit >10 years ago), PAfib on coumadin, HFrEF, RAS, CAD s/p CABG, HTN, hypothyroidism, AS, DVT, recent hospitalization about 6 weeks ago for fall with rib fractures, complicated by pneumothorax requiring chest tube placement, diastolic heart failure. He came to ED 03/23/22 from cardiology office w/ Dr Quentin Ore with shortness of breath, LE edema, and fatigue all worsening x6 weeks (since discharge).  Seen by cardiology today outpatient for PVC's follow up, sent to ED concern for acute on chronic diastolic HF w/ significant volume overload. He is having PND episodes multiple times per night.  He has severe lower extremity edema.  Some nights he is unable to sleep at all and sits at the dining room table. 11/29: in ED, soft BP, increased RR at 26, afebrile (+)Afib w/ rate 100. Chest x-ray: Diffuse bilateral opacities consistent with likely edema, less likely infection. CBC unremarkable. BMP unremarkable, renal function at baseline. BNP 630.4. Received IV furosemide 40 mg x1 in ED. Hospitalist service consulted for admission for exacerbation HFpEF.  Per cardiology note earlier today, recommend diuresis of course, repeat echo. Pt admitted to inpatient.  11/30: Net IO Since Admission: -1,500 mL [03/24/22 0814]. Cr slight bump but renal function appropriate. Still on 2-4L [place] and tachypneic. Lung sounds more coarse today, starting tx w/ steroids and nebs for possible COPD, consider repeat imaging if worsening  12/01: tachypnea resolved, BP stable, on 3L O2. Echo resutls: EF 50-55, no RWMA, diastolic parameters indeterminate. Net IO Since Admission: -2,260 mL [03/25/22 0912]. Patient still feeling SOB.  Cardiology ok to transition to po diuretics, will monitor through tonight but he has O2 at home if needed and can hopefully discharge tomorrow barring any new worsening / changes      Consultants:  none   Procedures: none      ASSESSMENT & PLAN:   Principal Problem:   Acute exacerbation of HFpEF Active Problems:   CAD (coronary artery disease), native coronary artery   Ischemic cardiomyopathy   Peripheral arterial disease (HCC)   Aortic valve stenosis   HTN (hypertension)   HYPERCHOLESTEROLEMIA   COPD (chronic obstructive pulmonary disease) (HCC)   Hypothyroidism   History of prior cigarette smoking   Bilateral renal artery stenosis (HCC)   Paroxysmal a-fib   Stage 3a chronic kidney disease (CKD) (HCC)   Venous stasis   Heart failure with preserved ejection fraction (HCC)  Acute exacerbation of HFpEF Heart failure with preserved ejection fraction (HCC) Acute Hypoxic Respiratory Failure d/t fluid overload from HFpEF possibly complicated by COPD in pt w/ tobacco history  Ischemic cardiomyopathy CAD (coronary artery disease), native coronary artery Diuresis w/ IV furosemide --> po  Continue statin, beta blocker  Strongly consider ACE/ARB pending renal function  Daily BMP Strict I&O Daily weights  Supplemental O2 as needed Repeat Echo read is pending  Cardiology following   Acute Hypoxic Respiratory Failure d/t fluid overload from HFpEF possibly complicated by COPD in pt w/ tobacco history  Possible COPD exacerbation Solumedrol IV today conver to prednisone po tomorrow  DooNeb scheduled Albuterol prn   HTN (hypertension) Coreg  6.25 mg bid to increase as able  Amlodipine 5 mg daily   HLD Atorvastatin 20 mg daily  Paroxysmal a-fib Telemetry for 24h Continue coumadin for anticoag  Continue carvedilol for rate control   Bilateral renal artery stenosis (HCC) Aortic valve stenosis Peripheral arterial disease (HCC) No s/s ischemia at this time  Cardiac  meds as above Continue Cilostazol   Venous stasis dermatitis  Topical steroids as needed   COPD (chronic obstructive pulmonary disease) ([place]) History of prior cigarette smoking COPD not thought to be contributing to current SOB  Serial lung exams Supplemental O2 as needed as above  Hypothyroidism Continue Synthroid home dose  Check TSH  Stage 3a chronic kidney disease (CKD) (HCC) Monitor BMP while diuresing   History DVTVTE Coumadin per pharmacy      DVT prophylaxis: Coumadin  Pertinent IV fluids/nutrition: none Central lines / invasive devices: none  Code Status: FULL CODE Family Communication: wife at bedside on rounds   Disposition: inpatient TOC needs: PT/OT pending may need HH  Barriers to discharge / significant pending items: plan d/c tomorrow if no concerning events overinght               Subjective:  Patient reports still SOB about same compared to yesterday Denies CP.  Pain controlled.  Denies new weakness but feels generally weak .  Tolerating diet.  Reports no concerns w/ urination/defecation.      Objective Findings:  Vitals:   03/25/22 0744 03/25/22 0852 03/25/22 1243 03/25/22 1606  BP:  122/72 (!) 110/53 (!) 115/59  Pulse:  86 83 93  Resp:  '16 18 18  '$ Temp:  97.7 F (36.5 C) 97.6 F (36.4 C) 97.8 F (36.6 C)  TempSrc:  Oral Oral   SpO2: 91% 93% 90% 92%  Weight:      Height:        Intake/Output Summary (Last 24 hours) at 03/25/2022 1659 Last data filed at 03/25/2022 1120 Gross per 24 hour  Intake 240 ml  Output 1200 ml  Net -960 ml   Filed Weights   03/24/22 1746 03/25/22 0100  Weight: 80.9 kg 81.4 kg    Examination: Constitutional:  VS as above General Appearance: alert, well-developed, well-nourished, NAD Respiratory: Normal respiratory effort +scattered wheeze No rhonchi +bibasilar rales Cardiovascular: S1/S2 normal No murmur No rub/gallop auscultated No lower extremity edema Gastrointestinal: No  tenderness No masses No hernia appreciated Musculoskeletal:  No clubbing/cyanosis of digits Symmetrical movement in all extremities Neurological: No cranial nerve deficit on limited exam Alert Psychiatric: Normal judgment/insight Normal mood and affect       Scheduled Medications:   atorvastatin  20 mg Oral Daily   carvedilol  6.25 mg Oral BID WC   cilostazol  50 mg Oral BID   [START ON 03/26/2022] furosemide  40 mg Oral Daily   ipratropium-albuterol  3 mL Nebulization Q6H   levothyroxine  150 mcg Oral Q0600   predniSONE  50 mg Oral Q breakfast   psyllium  1 packet Oral Daily   sodium chloride flush  3 mL Intravenous Q12H   triamcinolone cream  1 Application Topical BID   Warfarin - Pharmacist Dosing Inpatient   Does not apply q1600    Continuous Infusions:  sodium chloride      PRN Medications:  sodium chloride, acetaminophen, albuterol, mupirocin ointment, ondansetron (ZOFRAN) IV, sodium chloride flush  Antimicrobials:  Anti-infectives (From admission, onward)    None           Data Reviewed: I have personally reviewed following labs and imaging studies  CBC: Recent Labs  Lab 03/23/22 1258 03/24/22 0521  WBC  7.2 7.9  HGB 12.0* 12.0*  HCT 39.1 38.0*  MCV 91.4 89.6  PLT 276 025   Basic Metabolic Panel: Recent Labs  Lab 03/23/22 1258 03/24/22 0521 03/25/22 0414  NA 143 142 144  K 4.3 4.0 3.5  CL 109 106 109  CO2 '26 28 28  '$ GLUCOSE 94 143* 117*  BUN 20 22 30*  CREATININE 1.29* 1.39* 1.43*  CALCIUM 8.5* 8.9 8.7*   GFR: Estimated Creatinine Clearance: 41.2 mL/min (A) (by C-G formula based on SCr of 1.43 mg/dL (H)). Liver Function Tests: Recent Labs  Lab 03/23/22 1258  AST 14*  ALT 13  ALKPHOS 67  BILITOT 0.8  PROT 7.0  ALBUMIN 3.3*   No results for input(s): "LIPASE", "AMYLASE" in the last 168 hours. No results for input(s): "AMMONIA" in the last 168 hours. Coagulation Profile: Recent Labs  Lab 03/23/22 1258 03/24/22 0521  03/25/22 0414  INR 2.5* 2.7* 3.8*   Cardiac Enzymes: No results for input(s): "CKTOTAL", "CKMB", "CKMBINDEX", "TROPONINI" in the last 168 hours. BNP (last 3 results) No results for input(s): "PROBNP" in the last 8760 hours. HbA1C: No results for input(s): "HGBA1C" in the last 72 hours. CBG: No results for input(s): "GLUCAP" in the last 168 hours. Lipid Profile: No results for input(s): "CHOL", "HDL", "LDLCALC", "TRIG", "CHOLHDL", "LDLDIRECT" in the last 72 hours. Thyroid Function Tests: No results for input(s): "TSH", "T4TOTAL", "FREET4", "T3FREE", "THYROIDAB" in the last 72 hours. Anemia Panel: No results for input(s): "VITAMINB12", "FOLATE", "FERRITIN", "TIBC", "IRON", "RETICCTPCT" in the last 72 hours. Most Recent Urinalysis On File:     Component Value Date/Time   COLORURINE YELLOW 03/24/2021 [place] 03/24/2021 1547   LABSPEC 1.010 03/24/2021 1547   PHURINE 6.5 03/24/2021 1547   GLUCOSEU NEGATIVE 03/24/2021 1547   HGBUR NEGATIVE 03/24/2021 1547   BILIRUBINUR NEGATIVE 03/24/2021 1547   KETONESUR NEGATIVE 03/24/2021 1547   PROTEINUR NEGATIVE 12/22/2016 1121   UROBILINOGEN 0.2 03/24/2021 1547   NITRITE NEGATIVE 03/24/2021 1547   LEUKOCYTESUR NEGATIVE 03/24/2021 1547   Sepsis Labs: '@LABRCNTIP'$ (procalcitonin:4,lacticidven:4)  Recent Results (from the past 240 hour(s))  SARS Coronavirus 2 by RT PCR (hospital order, performed in [place] hospital lab) *cepheid single result test* Anterior Nasal Swab     Status: None   Collection Time: 03/23/22  2:57 PM   Specimen: Anterior Nasal Swab  Result Value Ref Range Status   SARS Coronavirus 2 by RT PCR NEGATIVE NEGATIVE Final    Comment: (NOTE) SARS-CoV-2 target nucleic acids are NOT DETECTED.  The SARS-CoV-2 RNA is generally detectable in upper and lower respiratory specimens during the acute phase of infection. The lowest concentration of SARS-CoV-2 viral copies this assay can detect is 250 copies / mL. A  negative result does not preclude SARS-CoV-2 infection and should not be used as the sole basis for treatment or other patient management decisions.  A negative result may occur with improper specimen collection / handling, submission of specimen other than nasopharyngeal swab, presence of viral mutation(s) within the areas targeted by this assay, and inadequate number of viral copies (<250 copies / mL). A negative result must be combined with clinical observations, patient history, and epidemiological information.  Fact Sheet for Patients:   https://www.patel.info/  Fact Sheet for Healthcare Providers: https://hall.com/  This test is not yet approved or  cleared by the [place] FDA and has been authorized for detection and/or diagnosis of SARS-CoV-2 by FDA under an Emergency Use Authorization (EUA).  This EUA will remain in  effect (meaning this test can be used) for the duration of the COVID-19 declaration under Section 564(b)(1) of the Act, 21 U.S.C. section 360bbb-3(b)(1), unless the authorization is terminated or revoked sooner.  Performed at [facility], [address] [place], [place] [address]          Radiology Studies: No results found.          LOS: 2 days      Emeterio Reeve, DO [facility] 03/25/2022, 4:59 PM    Dictation software may have been used to generate the above note. Typos may occur and escape review in typed/dictated notes. Please contact Dr Sheppard Coil directly for clarity if needed.  Staff may message me via secure chat in [place]  but this may not receive an immediate response,  please page me for urgent matters!  If 7PM-7AM, please contact night coverage [email or web address]

## 2022-03-25 NOTE — Progress Notes (Signed)
SATURATION QUALIFICATIONS: (This note is used to comply with regulatory documentation for home oxygen)  Patient Saturations on Room Air at Rest = 90%  Patient Saturations on Room Air while Ambulating = 87%  Patient Saturations on 2 Liters of oxygen while Ambulating = 92%  Please briefly explain why patient needs home oxygen: To maintain oxygen saturations upon exertion.

## 2022-03-25 NOTE — Progress Notes (Signed)
[name] for Warfarin Indication: atrial fibrillation  Allergies  Allergen Reactions   Plavix [Clopidogrel Bisulfate]     Brain hemorrhage prev while on plavix and aspirin    Patient Measurements: Height: '5\' 9"'$  (175.3 cm) Weight: 81.4 kg (179 lb 7.3 oz) IBW/kg (Calculated) : 70.7   Vital Signs: Temp: 97.5 F (36.4 C) (12/01 0351) BP: 106/62 (12/01 0351) Pulse Rate: 68 (12/01 0351)  Labs: Recent Labs    03/23/22 1258 03/23/22 1457 03/24/22 0521 03/25/22 0414  HGB 12.0*  --  12.0*  --   HCT 39.1  --  38.0*  --   PLT 276  --  305  --   LABPROT 27.0*  --  28.6* 37.2*  INR 2.5*  --  2.7* 3.8*  CREATININE 1.29*  --  1.39* 1.43*  TROPONINIHS 50* 46*  --   --      Estimated Creatinine Clearance: 41.2 mL/min (A) (by C-G formula based on SCr of 1.43 mg/dL (H)).   Medical History: Past Medical History:  Diagnosis Date   (HFimpEF) heart failure with improved ejection fraction ([place])    a. 06/2011 Echo: EF 35-40%; b. 06/2012 Echo: EF 50%; c. 12/2016 Echo: EF 55-60%, no rwma, GRI DD, mild AS/MR, nl RV fxn, nl RVSP; d. 02/2021 Echo: EF 55%, GrII DD. Mild MR. Mild to mod TR. Sev AS by VTI.   AAA (abdominal aortic aneurysm) ([place])    a. 05/2020 Abd u/s: 3.6cm.   Aortic calcification (HCC) 05/01/2013   Bilateral renal artery stenosis ([place])    a. 06/2013 Angio/PTA: LRA: 95 (5x18 Herculink stent), RRA 60ost; b. 04/2021 Angio: mod RRA stenosis w/ patent LRA stent.   Carotid arterial disease ([place])    a. 05/2020 Carotid U/S: RICA 9-89%, LICA 21-19%   Coronary artery disease    a. 2013 s/p CABG x 3 (LIMA->LAD, VG->OM, VG->RPDA; b. 06/2012 MV: no ischemia; c. 02/2021 Cath: LM 100, RCA 90ost, 134m free LIMA->LAD nl, VG->OM2 nl, VG->RPDA nl. Mod AS (mean grad 196mg, AVA 1.1cm^2). RHC w/ elev filling pressures.   History of prior cigarette smoking 04/11/2008   Qualifier: Diagnosis of  By: BeDiona BrownerD, Amy     Hypertension    Hypothyroidism     Intraventricular hemorrhage (HC[place]03/20/2014   Ischemic cardiomyopathy    a. 06/2011 Echo: EF 35-40%; b. 06/2012 Echo: EF 50%; c. 12/2016 Echo: EF 55-60%, no rwma, GRI DD; d. 02/2021 Echo: Ef 55%, GrII DD.   Moderate aortic stenosis    a. 02/2021 Echo: EF 55%, GrII DD. Sev Ca2+ of AoV. Sev AS w/ AVA by VTI 0.79cm^2; b. 02/2021 Cath: Mod AS w/ mean grad 1519m. AVA 1.1cm^2.   Peripheral arterial disease ([place]  a. Previous left lower extremity stenting by Dr. SanJamal Collinb. 12/2012 s/p bilat ostial common iliac stenting; c. 04/2021 Angio: Patent LRA stent, mod RRA stenosis. Small AAA. Sev plaque RSFA 2/ subtl occl of inf branch of profunda. Diff Ca2+ SFA dzs w/ collats from profunda and 3V runoff. Diffuse LSFA dzs w/ collats from profunda. Subtl PT dzs. 3V runoff-->Med Rx.   Right leg DVT ([facility]CC[place]4/21/2014   Subclavian artery stenosis, left ([place]CC[place]mit[place]2/2014   Status post stenting of the ostium and self-expanding stent placement to the left axillary artery   Venous insufficiency     Medications:  Scheduled:   atorvastatin  20 mg Oral Daily   carvedilol  6.25 mg Oral BID WC   cilostazol  50 mg  Oral BID   furosemide  40 mg Intravenous BID   ipratropium-albuterol  3 mL Nebulization Q6H   levothyroxine  150 mcg Oral Q0600   predniSONE  50 mg Oral Q breakfast   psyllium  1 packet Oral Daily   sodium chloride flush  3 mL Intravenous Q12H   triamcinolone cream  1 Application Topical BID   Warfarin - Pharmacist Dosing Inpatient   Does not apply q1600   Infusions:   sodium chloride     PRN: sodium chloride, acetaminophen, albuterol, mupirocin ointment, ondansetron (ZOFRAN) IV, sodium chloride flush  PTA Warfarin: 25 mg/wk 5 mg MWF  2.5 mg TuThSaSu  Assessment: [name] is a 81 y.o. male presenting with CHF exacerbation. PMH significant for CAD/CABG (2013), PAD (AAA, renal artery stenosis s/p renal artery stent, left subclavian artery stent), former smoker x60+ years, COPD, HTN, previous  bilateral DVT, paroxysmal AFib on Warfarin. Last dose of Warfarin PTA reported to be 11/28 (2.5 mg). INR was therapeutic on home regimen on arrival. Pharmacy has been consulted to manage Warfarin.   Baseline Labs: INR 2.5, Hgb 12.0, Hct 39.1, Plts 276  Goal of Therapy:  INR 2-3 Monitor platelets by anticoagulation protocol: Yes  Date    INR      Warfarin Dose    11/29 2.5 5 mg 11/30 2.7 2.5 mg 12/1 3.8 Hold    Plan:  Hold warfarin today given supratherapeutic INR Check INR daily until stable Check CBC at least weekly while on warfarin  Gretel Acre, PharmD PGY1 Pharmacy Resident 03/25/2022 7:39 AM

## 2022-03-25 NOTE — Progress Notes (Signed)
Rounding Note    Patient Name: [name] Date of Encounter: 03/25/2022  [place] Cardiologist: [name], MD   Subjective   UOP -1L. Patient reports breathing is better. He denies chest pain. He is on 3L O2. Scr/BUN up today.   Inpatient Medications    Scheduled Meds:  atorvastatin  20 mg Oral Daily   carvedilol  6.25 mg Oral BID WC   cilostazol  50 mg Oral BID   furosemide  40 mg Intravenous BID   ipratropium-albuterol  3 mL Nebulization Q6H   levothyroxine  150 mcg Oral Q0600   predniSONE  50 mg Oral Q breakfast   psyllium  1 packet Oral Daily   sodium chloride flush  3 mL Intravenous Q12H   triamcinolone cream  1 Application Topical BID   Warfarin - Pharmacist Dosing Inpatient   Does not apply q1600   Continuous Infusions:  sodium chloride     PRN Meds: sodium chloride, acetaminophen, albuterol, mupirocin ointment, ondansetron (ZOFRAN) IV, sodium chloride flush   Vital Signs    Vitals:   03/25/22 0100 03/25/22 0351 03/25/22 0744 03/25/22 0852  BP:  106/62  122/72  Pulse:  68  86  Resp:  19  16  Temp:  (!) 97.5 F (36.4 C)  97.7 F (36.5 C)  TempSrc:    Oral  SpO2:  90% 91% 93%  Weight: 81.4 kg     Height:        Intake/Output Summary (Last 24 hours) at 03/25/2022 0928 Last data filed at 03/25/2022 0351 Gross per 24 hour  Intake 240 ml  Output 1000 ml  Net -760 ml      03/25/2022    1:00 AM 03/24/2022    5:46 PM 03/23/2022   11:37 AM  Last 3 Weights  Weight (lbs) 179 lb 7.3 oz 178 lb 5.6 oz 175 lb  Weight (kg) 81.4 kg 80.9 kg 79.379 kg      Telemetry    NSR HR 80s - Personally Reviewed  ECG    No new - Personally Reviewed  Physical Exam   GEN: No acute distress.   Neck: No JVD Cardiac: RRR, no murmurs, rubs, or gallops.  Respiratory: diminished breath sounds bilaterally, wheezing. GI: Soft, nontender, non-distended  MS: No edema; No deformity. Neuro:  Nonfocal  Psych: Normal affect   Labs    High  Sensitivity Troponin:   Recent Labs  Lab 03/23/22 1258 03/23/22 1457  TROPONINIHS 50* 46*     Chemistry Recent Labs  Lab 03/23/22 1258 03/24/22 0521 03/25/22 0414  NA 143 142 144  K 4.3 4.0 3.5  CL 109 106 109  CO2 '26 28 28  '$ GLUCOSE 94 143* 117*  BUN 20 22 30*  CREATININE 1.29* 1.39* 1.43*  CALCIUM 8.5* 8.9 8.7*  PROT 7.0  --   --   ALBUMIN 3.3*  --   --   AST 14*  --   --   ALT 13  --   --   ALKPHOS 67  --   --   BILITOT 0.8  --   --   GFRNONAA 56* 51* 50*  ANIONGAP '8 8 7    '$ Lipids No results for input(s): "CHOL", "TRIG", "HDL", "LABVLDL", "LDLCALC", "CHOLHDL" in the last 168 hours.  Hematology Recent Labs  Lab 03/23/22 1258 03/24/22 0521  WBC 7.2 7.9  RBC 4.28 4.24  HGB 12.0* 12.0*  HCT 39.1 38.0*  MCV 91.4 89.6  MCH 28.0 28.3  MCHC 30.7 31.6  RDW 16.5* 16.4*  PLT 276 305   Thyroid No results for input(s): "TSH", "FREET4" in the last 168 hours.  BNP Recent Labs  Lab 03/23/22 1258  BNP 630.4*    DDimer No results for input(s): "DDIMER" in the last 168 hours.   Radiology    ECHOCARDIOGRAM COMPLETE  Result Date: 03/24/2022    ECHOCARDIOGRAM REPORT   Patient Name:   [name] Date of Exam: 03/24/2022 Medical Rec #:  [id number]      Height:       68.3 in Accession #:    [id number]     Weight:       175.0 lb Date of Birth:  [birth year]       BSA:          1.937 m Patient Age:    81 years       BP:           119/66 mmHg Patient Gender: M              HR:           85 bpm. Exam Location:  [facility] Procedure: 2D Echo, Color Doppler and Cardiac Doppler Indications:     I50.31 congestive heart failure-Acute Diastolic  History:         Patient has prior history of Echocardiogram examinations, most                  recent 02/09/2022. HFimpEF, CAD, Prior CABG, PAD and COPD,                  Signs/Symptoms:Edema; Risk Factors:Hypertension.  Sonographer:     Charmayne Sheer Referring Phys:  [id number] Emeterio Reeve Diagnosing Phys: Ida Rogue MD  Sonographer Comments:  Suboptimal subcostal window. Image acquisition challenging due to COPD. IMPRESSIONS  1. Left ventricular ejection fraction, by estimation, is 50 to 55%. The left ventricle has low normal function. The left ventricle has no regional wall motion abnormalities. Left ventricular diastolic parameters are indeterminate.  2. Right ventricular systolic function is normal. The right ventricular size is normal.  3. The mitral valve is normal in structure. Mild to moderate mitral valve regurgitation. No evidence of mitral stenosis.  4. The aortic valve is normal in structure. There is severe calcifcation of the aortic valve. Aortic valve regurgitation is not visualized. Moderate aortic valve stenosis. Aortic valve area, by VTI measures 1.52 cm. Aortic valve mean gradient measures 17.0 mmHg. Aortic valve Vmax measures 2.40 m/s.  5. The inferior vena cava is dilated in size with <50% respiratory variability, suggesting right atrial pressure of 15 mmHg.  6. Frequent PVCs noted FINDINGS  Left Ventricle: Left ventricular ejection fraction, by estimation, is 50 to 55%. The left ventricle has low normal function. The left ventricle has no regional wall motion abnormalities. The left ventricular internal cavity size was normal in size. There is no left ventricular hypertrophy. Left ventricular diastolic parameters are indeterminate. Right Ventricle: The right ventricular size is normal. No increase in right ventricular wall thickness. Right ventricular systolic function is normal. Left Atrium: Left atrial size was normal in size. Right Atrium: Right atrial size was normal in size. Pericardium: There is no evidence of pericardial effusion. Mitral Valve: The mitral valve is normal in structure. Mild to moderate mitral valve regurgitation. No evidence of mitral valve stenosis. Tricuspid Valve: The tricuspid valve is normal in structure. Tricuspid valve regurgitation is mild . No evidence of tricuspid stenosis. Aortic Valve: The  aortic  valve is normal in structure. There is severe calcifcation of the aortic valve. Aortic valve regurgitation is not visualized. Moderate aortic stenosis is present. Aortic valve mean gradient measures 17.0 mmHg. Aortic valve peak gradient measures 22.9 mmHg. Aortic valve area, by VTI measures 1.52 cm. Pulmonic Valve: The pulmonic valve was normal in structure. Pulmonic valve regurgitation is not visualized. No evidence of pulmonic stenosis. Aorta: The aortic root is normal in size and structure. Venous: The inferior vena cava is dilated in size with less than 50% respiratory variability, suggesting right atrial pressure of 15 mmHg. IAS/Shunts: No atrial level shunt detected by color flow Doppler.  LEFT VENTRICLE PLAX 2D LVIDd:         4.70 cm   Diastology LVIDs:         3.90 cm   LV e' medial:    5.87 cm/s LV PW:         1.40 cm   LV E/e' medial:  18.7 LV IVS:        1.00 cm   LV e' lateral:   11.20 cm/s LVOT diam:     2.20 cm   LV E/e' lateral: 9.8 LV SV:         66 LV SV Index:   34 LVOT Area:     3.80 cm  RIGHT VENTRICLE RV Basal diam:  3.50 cm LEFT ATRIUM             Index        RIGHT ATRIUM           Index LA diam:        3.90 cm 2.01 cm/m   RA Area:     13.20 cm LA Vol (A2C):   31.7 ml 16.37 ml/m  RA Volume:   32.20 ml  16.63 ml/m LA Vol (A4C):   37.2 ml 19.21 ml/m LA Biplane Vol: 34.8 ml 17.97 ml/m  AORTIC VALVE                     PULMONIC VALVE AV Area (Vmax):    1.48 cm      PV Vmax:       0.90 m/s AV Area (Vmean):   1.38 cm      PV Vmean:      63.700 cm/s AV Area (VTI):     1.52 cm      PV VTI:        0.169 m AV Vmax:           239.50 cm/s   PV Peak grad:  3.2 mmHg AV Vmean:          176.000 cm/s  PV Mean grad:  2.0 mmHg AV VTI:            0.436 m AV Peak Grad:      22.9 mmHg AV Mean Grad:      17.0 mmHg LVOT Vmax:         93.30 cm/s LVOT Vmean:        63.700 cm/s LVOT VTI:          0.174 m LVOT/AV VTI ratio: 0.40  AORTA Ao Root diam: 3.00 cm MITRAL VALVE MV Area (PHT): 4.74 cm     SHUNTS MV  Decel Time: 160 msec     Systemic VTI:  0.17 m MV E velocity: 110.00 cm/s  Systemic Diam: 2.20 cm MV A velocity: 49.30 cm/s MV E/A ratio:  2.23 Ida Rogue MD Electronically signed  by Ida Rogue MD Signature Date/Time: 03/24/2022/5:58:18 PM    Final    DG Chest Port 1 View  Result Date: 03/23/2022 CLINICAL DATA:  Shortness of breath EXAM: PORTABLE CHEST 1 VIEW COMPARISON:  03/09/2022 FINDINGS: Stable heart size status post sternotomy and CABG. Aortic atherosclerosis. Diffuse bilateral interstitial opacities most pronounced in the perihilar and bibasilar regions, left worse than right. No large pleural fluid collection. No pneumothorax. IMPRESSION: Diffuse bilateral interstitial opacities most pronounced in the perihilar and bibasilar regions, left worse than right. Findings may represent pulmonary edema or infection. Electronically Signed   By: Davina Poke D.O.   On: 03/23/2022 13:19    Cardiac Studies    Echo 02/09/22 1. Left ventricular ejection fraction, by estimation, is 55 to 60%. The  left ventricle has normal function. The left ventricle has no regional  wall motion abnormalities. Left ventricular diastolic function could not  be evaluated.   2. Right ventricular systolic function is normal. The right ventricular  size is normal. There is mildly elevated pulmonary artery systolic  pressure. The estimated right ventricular systolic pressure is 68.3 mmHg.   3. Left atrial size was mildly dilated.   4. Right atrial size was mildly dilated.   5. The mitral valve is normal in structure. Mild mitral valve  regurgitation. No evidence of mitral stenosis.   6. The aortic valve is tricuspid. There is severe calcifcation of the  aortic valve. Aortic valve regurgitation is not visualized. Mild to  moderate aortic valve stenosis. Aortic valve mean gradient measures 16.5  mmHg. Aortic valve Vmax measures 2.66 m/s.   7. The inferior vena cava is normal in size with greater than 50%   respiratory variability, suggesting right atrial pressure of 3 mmHg. \    Heart monitor 01/2022 Patient had a min HR of 63 bpm, max HR of 187 bpm, and avg HR of 83 bpm.  Predominant underlying rhythm was Sinus Rhythm. Bundle Branch Block/IVCD was present.  22 Ventricular Tachycardia runs occurred, the run with the fastest interval lasting 4 beats with a max rate of 167 bpm, the longest lasting 6 beats with an avg rate of 104 bpm.  24 Supraventricular Tachycardia runs occurred, the run with the fastest interval lasting 14 beats with a max rate of 187 bpm, the longest lasting 20 beats with an avg rate of 111 bpm.  Occasional PACs with a burden of 2.8% and frequent PVCs with a burden of 17%.   R/L heart cath 02/2021    Ost LM to Dist LM lesion is 100% stenosed.   Prox RCA to Mid RCA lesion is 100% stenosed.   Ost RCA to Prox RCA lesion is 90% stenosed.   SVG graft was visualized by angiography and is normal in caliber.   SVG graft was visualized by angiography and is normal in caliber.   LIMA graft was visualized by angiography and is normal in caliber.   The graft exhibits no disease.   The graft exhibits no disease.   1.  Occluded native vessels including left main and mid right coronary artery.  Patent and normal grafts including SVG to right PDA, SVG to OM 2 and free LIMA to LAD which is anastomosed to the proximal portion of SVG to OM. 2.  Moderate aortic stenosis with mean gradient of 15 mmHg and valve area of 1.1 cm. 3.  Right heart catheterization showed moderately elevated wedge pressure, mild pulmonary hypertension and mildly reduced cardiac output.   Recommendations: Continue medical therapy  for coronary artery disease. Aortic stenosis does not require intervention at this point. There is evidence of diastolic heart failure with elevated filling pressures.  We will resume furosemide at 20 mg every other day. The patient reports a lot of calf claudication bilaterally and will  evaluate with outpatient Dopplers.  Patient Profile     81 y.o. male with a hx of peripheral arterial disease, renal artery stenosis, carotid arterial disease, tobacco abuse, COPD, abdominal aortic aneurysm, intracranial hemorrhage in the setting of malignant hypertension, hyperlipidemia, bilateral lower extremity DVTs, heart failure with improved EF/ischemic cardiomyopathy, CAD status post CABG x 4 in March 2013 who is being seen 03/23/2022 for the evaluation of volume overload.   Assessment & Plan    Acute on chronic diastolic heart failure -Patient sent to the ER from the office 11/29 for volume overload and shortness of breath requiring 3 L of oxygen.  Patient reports progressive shortness of breath since pneumothorax in October 2023. -In the ER BNP in the 600s, chest x-ray with diffuse bilateral interstitial opacities, likely pulmonary edema, no pneumothorax - echo showed LVEF 50-55%, no WMA, normal RVSF, mild to mod MR, moderate aortic valve stenosis -PTA Lasix 20 mg daily -Started on IV Lasix 40 mg twice daily - Net -2.2L - volume status is improving  -Continue Coreg, monitor BP with intermittent soft pressures -Monitor kidney function, daily weights, I's and O's with diuresis - Scr/BUN trending up, can likely switch to oral lasix. PTA lasix '20mg'$  daily   Paroxysmal A-fib -Continue warfarin per pharmacy -In sinus rhythm with PVCs   PVCs -Recent ZIO showed 17% PVC burden -Continue Coreg, may tolerate increase of Coreg   Elevated troponin CAD status post remote CABG -Diagnostic catheterization in November 2022 showed 3 out of 3 patent grafts and elevated filling pressures, moderate aortic stenosis by valve area/gradient at that time. -Troponin mildly elevated to 50, now downtrending, not consistent with ACS.  Likely supply demand mismatch in the setting of acute heart failure -Patient denies chest pain -Continue beta-blocker and statin therapy   Moderate aortic  stenosis -Diagnostic cath showed moderate aortic stenosis with a gradient of 15 mmHg and a valve area of 1.1 cm. -Echo this admission showed LVEF 50-55% and moderate AS   History of bilateral lower extremity DVTs -Continue chronic warfarin   CKD stage 3 - monitor Scr with diuresis  For questions or updates, please contact [place] Please consult [email or web address] for contact info under        Signed, Erinne Gillentine Ninfa Meeker, PA-C  03/25/2022, 9:28 AM

## 2022-03-26 ENCOUNTER — Other Ambulatory Visit: Payer: Self-pay | Admitting: Family Medicine

## 2022-03-26 DIAGNOSIS — I5033 Acute on chronic diastolic (congestive) heart failure: Secondary | ICD-10-CM | POA: Diagnosis not present

## 2022-03-26 DIAGNOSIS — I350 Nonrheumatic aortic (valve) stenosis: Secondary | ICD-10-CM | POA: Diagnosis not present

## 2022-03-26 DIAGNOSIS — Z7901 Long term (current) use of anticoagulants: Secondary | ICD-10-CM

## 2022-03-26 DIAGNOSIS — I10 Essential (primary) hypertension: Secondary | ICD-10-CM | POA: Diagnosis not present

## 2022-03-26 DIAGNOSIS — I25118 Atherosclerotic heart disease of native coronary artery with other forms of angina pectoris: Secondary | ICD-10-CM | POA: Diagnosis not present

## 2022-03-26 LAB — BASIC METABOLIC PANEL
Anion gap: 5 (ref 5–15)
BUN: 34 mg/dL — ABNORMAL HIGH (ref 8–23)
CO2: 30 mmol/L (ref 22–32)
Calcium: 8.6 mg/dL — ABNORMAL LOW (ref 8.9–10.3)
Chloride: 108 mmol/L (ref 98–111)
Creatinine, Ser: 1.27 mg/dL — ABNORMAL HIGH (ref 0.61–1.24)
GFR, Estimated: 57 mL/min — ABNORMAL LOW (ref >60)
Glucose, Bld: 96 mg/dL (ref 70–99)
Potassium: 3.4 mmol/L — ABNORMAL LOW (ref 3.5–5.1)
Sodium: 143 mmol/L (ref 135–145)

## 2022-03-26 MED ORDER — FUROSEMIDE 40 MG PO TABS: 40.0000 mg | ORAL_TABLET | Freq: Every day | ORAL | 0 refills | Status: DC

## 2022-03-26 NOTE — Discharge Summary (Signed)
Physician Discharge Summary   Patient: [name] MRN: [id number]  DOB: 1940/10/10   Admit:     Date of Admission: 03/23/2022 Admitted from: home   Discharge: Date of discharge: 03/26/22 Disposition: Home Condition at discharge: good  CODE STATUS: FULL CODE      Discharge Physician: Emeterio Reeve, DO [facility]     PCP: Owens Loffler, MD  Recommendations for Outpatient Follow-up:  Follow up with PCP Copland, Frederico Hamman, MD in 1-2 weeks Please obtain labs/tests: CBC, BMP in 1-2 weeks Monitor BP Monitor SpO2, concern for COPD  Please follow up on the following pending results: none PCP AND OTHER OUTPATIENT PROVIDERS: SEE BELOW FOR SPECIFIC DISCHARGE INSTRUCTIONS PRINTED FOR PATIENT IN ADDITION TO GENERIC AVS PATIENT INFO     Discharge Instructions     (HEART FAILURE PATIENTS) Call MD:  Anytime you have any of the following symptoms: 1) 3 pound weight gain in 24 hours or 5 pounds in 1 week 2) shortness of breath, with or without a dry hacking cough 3) swelling in the hands, feet or stomach 4) if you have to sleep on extra pillows at night in order to breathe.   Complete by: As directed    AMB referral to CHF clinic   Complete by: As directed    Diet - low sodium heart healthy   Complete by: As directed    Diet - low sodium heart healthy   Complete by: As directed    Discharge instructions   Complete by: As directed    Can use oxygen at 2 liters per minute as needed for shortness of breath / with ambulation   Increase activity slowly   Complete by: As directed    Increase activity slowly   Complete by: As directed          Discharge Diagnoses: Principal Problem:   Acute exacerbation of HFpEF Active Problems:   CAD (coronary artery disease), native coronary artery   Ischemic cardiomyopathy   Peripheral arterial disease ([place])   Aortic valve stenosis   HTN (hypertension)   HYPERCHOLESTEROLEMIA   COPD (chronic obstructive pulmonary  disease) ([place])   Hypothyroidism   History of prior cigarette smoking   Bilateral renal artery stenosis (HCC)   PAF (paroxysmal atrial fibrillation) ([place])   Stage 3a chronic kidney disease (CKD) ([place])   Venous stasis   Heart failure with preserved ejection fraction ([place])   History of deep vein thrombosis (DVT) of lower extremity   Acute heart failure with preserved ejection fraction (HFpEF) [facility])       Hospital Course: [name] is a 81 y.o. male with past medical history of COPD with chronic hypoxic respiratory failure not currently smoking (quit >10 years ago), PAfib on coumadin, HFrEF, RAS, CAD s/p CABG, HTN, hypothyroidism, AS, DVT, recent hospitalization about 6 weeks ago for fall with rib fractures, complicated by pneumothorax requiring chest tube placement, diastolic heart failure. He came to ED 03/23/22 from cardiology office w/ Dr Quentin Ore with shortness of breath, LE edema, and fatigue all worsening x6 weeks (since discharge).  Seen by cardiology today outpatient for PVC's follow up, sent to ED concern for acute on chronic diastolic HF w/ significant volume overload. He is having PND episodes multiple times per night.  He has severe lower extremity edema.  Some nights he is unable to sleep at all and sits at the dining room table. 11/29: in ED, soft BP, increased RR at 26, afebrile (+)Afib w/ rate 100. Chest  x-ray: Diffuse bilateral opacities consistent with likely edema, less likely infection. CBC unremarkable. BMP unremarkable, renal function at baseline. BNP 630.4. Received IV furosemide 40 mg x1 in ED. Hospitalist service consulted for admission for exacerbation HFpEF.  Per cardiology note earlier today, recommend diuresis of course, repeat echo. Pt admitted to inpatient.  11/30: Net IO Since Admission: -1,500 mL [03/24/22 0814]. Cr slight bump but renal function appropriate. Still on 2-4L [place] and tachypneic. Lung sounds more coarse today, starting tx w/ steroids and nebs for  possible COPD, consider repeat imaging if worsening  12/01: tachypnea resolved, BP stable, on 3L O2. Echo resutls: EF 50-55, no RWMA, diastolic parameters indeterminate. Net IO Since Admission: -2,260 mL [03/25/22 0912]. Patient still feeling SOB. Cardiology ok to transition to po diuretics, will monitor through tonight but he has O2 at home if needed and can hopefully discharge tomorrow barring any new worsening / changes  12/02: pt feeling improved and would like to go home, stable for discharge      Consultants:  Cardiology    Procedures: none      ASSESSMENT & PLAN:   Principal Problem:   Acute exacerbation of HFpEF Active Problems:   CAD (coronary artery disease), native coronary artery   Ischemic cardiomyopathy   Peripheral arterial disease (HCC)   Aortic valve stenosis   HTN (hypertension)   HYPERCHOLESTEROLEMIA   COPD (chronic obstructive pulmonary disease) ([place])   Hypothyroidism   History of prior cigarette smoking   Bilateral renal artery stenosis (HCC)   Paroxysmal a-fib   Stage 3a chronic kidney disease (CKD) (HCC)   Venous stasis   Heart failure with preserved ejection fraction (HCC)  Acute exacerbation of HFpEF Heart failure with preserved ejection fraction (HCC) Acute Hypoxic Respiratory Failure d/t fluid overload from HFpEF possibly complicated by COPD in pt w/ tobacco history - improved Ischemic cardiomyopathy CAD (coronary artery disease), native coronary artery Diuresis w/ IV furosemide --> po on discharge  Continue statin, beta blocker  Strongly consider ACE/ARB pending renal function outpatient and BP Supplemental O2 as needed Cardiology to follow outpatinet  Acute Hypoxic Respiratory Failure d/t fluid overload from HFpEF possibly complicated by COPD in pt w/ tobacco history  Possible COPD exacerbation Solumedrol IV convert to prednisone po on discharge Rx for Anoro Albuterol prn  Follow outpatient, consider PFT   HTN (hypertension) See  cardiac meds on med rec   HLD Atorvastatin 20 mg daily   Paroxysmal a-fib Continue coumadin for anticoag  Continue carvedilol for rate control   Bilateral renal artery stenosis (HCC) Aortic valve stenosis Peripheral arterial disease (HCC) No s/s ischemia at this time  Cardiac meds as above / on med rec  Continue Cilostazol   Venous stasis dermatitis  Topical steroids as needed   COPD (chronic obstructive pulmonary disease) ([place]) History of prior cigarette smoking Supplemental O2 as needed as above  Hypothyroidism Continue Synthroid home dose   Stage 3a chronic kidney disease (CKD) ([place]) Monitor BMP outpatient   History DVTVTE Coumadin per pharmacy               Discharge Instructions  Allergies as of 03/26/2022       Reactions   Plavix [clopidogrel Bisulfate]    Brain hemorrhage prev while on plavix and aspirin        Medication List     STOP taking these medications    amLODipine 5 MG tablet Commonly known as: NORVASC   mupirocin ointment 2 % Commonly known as: [employer]  psyllium 95 % Pack Commonly known as: HYDROCIL/METAMUCIL   triamcinolone ointment 0.1 % Commonly known as: KENALOG       TAKE these medications    albuterol 108 (90 Base) MCG/ACT inhaler Commonly known as: VENTOLIN HFA Inhale 2 puffs into the lungs every 6 (six) hours as needed for wheezing or shortness of breath.   Anoro Ellipta 62.5-25 MCG/ACT Aepb Generic drug: umeclidinium-vilanterol Inhale 1 puff into the lungs daily.   atorvastatin 20 MG tablet Commonly known as: LIPITOR TAKE 1 TABLET BY MOUTH EVERY DAY   augmented betamethasone dipropionate 0.05 % cream Commonly known as: DIPROLENE-AF APPLY TO AFFECTED AREA TWICE A DAY   carvedilol 6.25 MG tablet Commonly known as: COREG TAKE 1 TABLET BY MOUTH TWICE A DAY WITH MEALS   cilostazol 50 MG tablet Commonly known as: PLETAL TAKE 1 TABLET BY MOUTH TWICE A DAY   furosemide 40 MG tablet Commonly  known as: LASIX Take 1 tablet (40 mg total) by mouth daily. Take extra 20-40 mg (0.5-1 tablet for total daily dose 60-80 mg) orally as needed for weight gain of 3 pounds overnight or 5 pounds in one week, lower leg swelling with shortness of breath What changed:  how much to take when to take this additional instructions   levothyroxine 150 MCG tablet Commonly known as: SYNTHROID TAKE ONE TABLET BY MOUTH EVERY DAY BEFORE BREAKFAST   predniSONE 50 MG tablet Commonly known as: DELTASONE Take 1 tablet (50 mg total) by mouth daily with breakfast.   warfarin 5 MG tablet Commonly known as: COUMADIN Take as directed. If you are unsure how to take this medication, talk to your nurse or doctor. Original instructions: Take 0.5-1 tablets (2.5-5 mg total) by mouth See admin instructions. TAKE 1/2 TABLET DAILY BY MOUTH EXCEPT TAKE 1 TABLET ON MON, WED, FRI OR AS DIRECTED ANTICOAGULATION CLINIC          Allergies  Allergen Reactions   Plavix [Clopidogrel Bisulfate]     Brain hemorrhage prev while on plavix and aspirin     Subjective: pt feeling better today, off O2 at rest, would like to go home    Discharge Exam: BP 115/61 (BP Location: Right Arm)   Pulse (!) 115   Temp (!) 97.5 F (36.4 C) (Oral)   Resp 20   Ht '5\' 9"'$  (1.753 m)   Wt 81.2 kg   SpO2 90%   BMI 26.44 kg/m  General: Pt is alert, awake, not in acute distress Cardiovascular: RRR, S1/S2 +, no rubs, no gallops Respiratory:  diminshed breath sounds bilaterally, no wheezing, no rhonchi, very faint crackles at bases bilaterally Abdominal: Soft, NT, ND, bowel sounds + Extremities: no edema, no cyanosis     The results of significant diagnostics from this hospitalization (including imaging, microbiology, ancillary and laboratory) are listed below for reference.     Microbiology: Recent Results (from the past 240 hour(s))  SARS Coronavirus 2 by RT PCR (hospital order, performed in [facility] hospital lab) *cepheid  single result test* Anterior Nasal Swab     Status: None   Collection Time: 03/23/22  2:57 PM   Specimen: Anterior Nasal Swab  Result Value Ref Range Status   SARS Coronavirus 2 by RT PCR NEGATIVE NEGATIVE Final    Comment: (NOTE) SARS-CoV-2 target nucleic acids are NOT DETECTED.  The SARS-CoV-2 RNA is generally detectable in upper and lower respiratory specimens during the acute phase of infection. The lowest concentration of SARS-CoV-2 viral copies this assay can detect is  250 copies / mL. A negative result does not preclude SARS-CoV-2 infection and should not be used as the sole basis for treatment or other patient management decisions.  A negative result may occur with improper specimen collection / handling, submission of specimen other than nasopharyngeal swab, presence of viral mutation(s) within the areas targeted by this assay, and inadequate number of viral copies (<250 copies / mL). A negative result must be combined with clinical observations, patient history, and epidemiological information.  Fact Sheet for Patients:   https://www.patel.info/  Fact Sheet for Healthcare Providers: https://hall.com/  This test is not yet approved or  cleared by the [place] FDA and has been authorized for detection and/or diagnosis of SARS-CoV-2 by FDA under an Emergency Use Authorization (EUA).  This EUA will remain in effect (meaning this test can be used) for the duration of the COVID-19 declaration under Section 564(b)(1) of the Act, 21 U.S.C. section 360bbb-3(b)(1), unless the authorization is terminated or revoked sooner.  Performed at [facility], [address] [place], [place] [address]      Labs: BNP (last 3 results) Recent Labs    11/09/21 0941 02/09/22 0024 03/23/22 1258  BNP 271.8* 350.9* 627.0*   Basic Metabolic Panel: Recent Labs  Lab 03/23/22 1258 03/24/22 0521 03/25/22 0414 03/26/22 0613  NA  143 142 144 143  K 4.3 4.0 3.5 3.4*  CL 109 106 109 108  CO2 '26 28 28 30  '$ GLUCOSE 94 143* 117* 96  BUN 20 22 30* 34*  CREATININE 1.29* 1.39* 1.43* 1.27*  CALCIUM 8.5* 8.9 8.7* 8.6*   Liver Function Tests: Recent Labs  Lab 03/23/22 1258  AST 14*  ALT 13  ALKPHOS 67  BILITOT 0.8  PROT 7.0  ALBUMIN 3.3*   No results for input(s): "LIPASE", "AMYLASE" in the last 168 hours. No results for input(s): "AMMONIA" in the last 168 hours. CBC: Recent Labs  Lab 03/23/22 1258 03/24/22 0521  WBC 7.2 7.9  HGB 12.0* 12.0*  HCT 39.1 38.0*  MCV 91.4 89.6  PLT 276 305   Cardiac Enzymes: No results for input(s): "CKTOTAL", "CKMB", "CKMBINDEX", "TROPONINI" in the last 168 hours. BNP: Invalid input(s): "POCBNP" CBG: No results for input(s): "GLUCAP" in the last 168 hours. D-Dimer No results for input(s): "DDIMER" in the last 72 hours. Hgb A1c No results for input(s): "HGBA1C" in the last 72 hours. Lipid Profile No results for input(s): "CHOL", "HDL", "LDLCALC", "TRIG", "CHOLHDL", "LDLDIRECT" in the last 72 hours. Thyroid function studies No results for input(s): "TSH", "T4TOTAL", "T3FREE", "THYROIDAB" in the last 72 hours.  Invalid input(s): "FREET3" Anemia work up No results for input(s): "VITAMINB12", "FOLATE", "FERRITIN", "TIBC", "IRON", "RETICCTPCT" in the last 72 hours. Urinalysis    Component Value Date/Time   COLORURINE YELLOW 03/24/2021 1547   APPEARANCEUR CLEAR 03/24/2021 1547   LABSPEC 1.010 03/24/2021 1547   PHURINE 6.5 03/24/2021 1547   GLUCOSEU NEGATIVE 03/24/2021 1547   HGBUR NEGATIVE 03/24/2021 1547   BILIRUBINUR NEGATIVE 03/24/2021 1547   KETONESUR NEGATIVE 03/24/2021 1547   PROTEINUR NEGATIVE 12/22/2016 1121   UROBILINOGEN 0.2 03/24/2021 1547   NITRITE NEGATIVE 03/24/2021 1547   LEUKOCYTESUR NEGATIVE 03/24/2021 1547   Sepsis Labs Recent Labs  Lab 03/23/22 1258 03/24/22 0521  WBC 7.2 7.9   Microbiology Recent Results (from the past 240 hour(s))   SARS Coronavirus 2 by RT PCR (hospital order, performed in [facility] hospital lab) *cepheid single result test* Anterior Nasal Swab     Status: None  Collection Time: 03/23/22  2:57 PM   Specimen: Anterior Nasal Swab  Result Value Ref Range Status   SARS Coronavirus 2 by RT PCR NEGATIVE NEGATIVE Final    Comment: (NOTE) SARS-CoV-2 target nucleic acids are NOT DETECTED.  The SARS-CoV-2 RNA is generally detectable in upper and lower respiratory specimens during the acute phase of infection. The lowest concentration of SARS-CoV-2 viral copies this assay can detect is 250 copies / mL. A negative result does not preclude SARS-CoV-2 infection and should not be used as the sole basis for treatment or other patient management decisions.  A negative result may occur with improper specimen collection / handling, submission of specimen other than nasopharyngeal swab, presence of viral mutation(s) within the areas targeted by this assay, and inadequate number of viral copies (<250 copies / mL). A negative result must be combined with clinical observations, patient history, and epidemiological information.  Fact Sheet for Patients:   https://www.patel.info/  Fact Sheet for Healthcare Providers: https://hall.com/  This test is not yet approved or  cleared by the [place] FDA and has been authorized for detection and/or diagnosis of SARS-CoV-2 by FDA under an Emergency Use Authorization (EUA).  This EUA will remain in effect (meaning this test can be used) for the duration of the COVID-19 declaration under Section 564(b)(1) of the Act, 21 U.S.C. section 360bbb-3(b)(1), unless the authorization is terminated or revoked sooner.  Performed at [facility], [address] [place], [place] [address]    Imaging ECHOCARDIOGRAM COMPLETE  Result Date: 03/24/2022    ECHOCARDIOGRAM REPORT   Patient Name:   [name] Date of Exam:  03/24/2022 Medical Rec #:  [id number]      Height:       68.3 in Accession #:    [id number]     Weight:       175.0 lb Date of Birth:  [birth year]       BSA:          1.937 m Patient Age:    53 years       BP:           119/66 mmHg Patient Gender: M              HR:           85 bpm. Exam Location:  [facility] Procedure: 2D Echo, Color Doppler and Cardiac Doppler Indications:     I50.31 congestive heart failure-Acute Diastolic  History:         Patient has prior history of Echocardiogram examinations, most                  recent 02/09/2022. HFimpEF, CAD, Prior CABG, PAD and COPD,                  Signs/Symptoms:Edema; Risk Factors:Hypertension.  Sonographer:     Charmayne Sheer Referring Phys:  [id number] Emeterio Reeve Diagnosing Phys: Ida Rogue MD  Sonographer Comments: Suboptimal subcostal window. Image acquisition challenging due to COPD. IMPRESSIONS  1. Left ventricular ejection fraction, by estimation, is 50 to 55%. The left ventricle has low normal function. The left ventricle has no regional wall motion abnormalities. Left ventricular diastolic parameters are indeterminate.  2. Right ventricular systolic function is normal. The right ventricular size is normal.  3. The mitral valve is normal in structure. Mild to moderate mitral valve regurgitation. No evidence of mitral stenosis.  4. The aortic valve is normal in structure. There is severe calcifcation of the aortic  valve. Aortic valve regurgitation is not visualized. Moderate aortic valve stenosis. Aortic valve area, by VTI measures 1.52 cm. Aortic valve mean gradient measures 17.0 mmHg. Aortic valve Vmax measures 2.40 m/s.  5. The inferior vena cava is dilated in size with <50% respiratory variability, suggesting right atrial pressure of 15 mmHg.  6. Frequent PVCs noted FINDINGS  Left Ventricle: Left ventricular ejection fraction, by estimation, is 50 to 55%. The left ventricle has low normal function. The left ventricle has no regional wall motion  abnormalities. The left ventricular internal cavity size was normal in size. There is no left ventricular hypertrophy. Left ventricular diastolic parameters are indeterminate. Right Ventricle: The right ventricular size is normal. No increase in right ventricular wall thickness. Right ventricular systolic function is normal. Left Atrium: Left atrial size was normal in size. Right Atrium: Right atrial size was normal in size. Pericardium: There is no evidence of pericardial effusion. Mitral Valve: The mitral valve is normal in structure. Mild to moderate mitral valve regurgitation. No evidence of mitral valve stenosis. Tricuspid Valve: The tricuspid valve is normal in structure. Tricuspid valve regurgitation is mild . No evidence of tricuspid stenosis. Aortic Valve: The aortic valve is normal in structure. There is severe calcifcation of the aortic valve. Aortic valve regurgitation is not visualized. Moderate aortic stenosis is present. Aortic valve mean gradient measures 17.0 mmHg. Aortic valve peak gradient measures 22.9 mmHg. Aortic valve area, by VTI measures 1.52 cm. Pulmonic Valve: The pulmonic valve was normal in structure. Pulmonic valve regurgitation is not visualized. No evidence of pulmonic stenosis. Aorta: The aortic root is normal in size and structure. Venous: The inferior vena cava is dilated in size with less than 50% respiratory variability, suggesting right atrial pressure of 15 mmHg. IAS/Shunts: No atrial level shunt detected by color flow Doppler.  LEFT VENTRICLE PLAX 2D LVIDd:         4.70 cm   Diastology LVIDs:         3.90 cm   LV e' medial:    5.87 cm/s LV PW:         1.40 cm   LV E/e' medial:  18.7 LV IVS:        1.00 cm   LV e' lateral:   11.20 cm/s LVOT diam:     2.20 cm   LV E/e' lateral: 9.8 LV SV:         66 LV SV Index:   34 LVOT Area:     3.80 cm  RIGHT VENTRICLE RV Basal diam:  3.50 cm LEFT ATRIUM             Index        RIGHT ATRIUM           Index LA diam:        3.90 cm 2.01  cm/m   RA Area:     13.20 cm LA Vol (A2C):   31.7 ml 16.37 ml/m  RA Volume:   32.20 ml  16.63 ml/m LA Vol (A4C):   37.2 ml 19.21 ml/m LA Biplane Vol: 34.8 ml 17.97 ml/m  AORTIC VALVE                     PULMONIC VALVE AV Area (Vmax):    1.48 cm      PV Vmax:       0.90 m/s AV Area (Vmean):   1.38 cm      PV Vmean:      63.700  cm/s AV Area (VTI):     1.52 cm      PV VTI:        0.169 m AV Vmax:           239.50 cm/s   PV Peak grad:  3.2 mmHg AV Vmean:          176.000 cm/s  PV Mean grad:  2.0 mmHg AV VTI:            0.436 m AV Peak Grad:      22.9 mmHg AV Mean Grad:      17.0 mmHg LVOT Vmax:         93.30 cm/s LVOT Vmean:        63.700 cm/s LVOT VTI:          0.174 m LVOT/AV VTI ratio: 0.40  AORTA Ao Root diam: 3.00 cm MITRAL VALVE MV Area (PHT): 4.74 cm     SHUNTS MV Decel Time: 160 msec     Systemic VTI:  0.17 m MV E velocity: 110.00 cm/s  Systemic Diam: 2.20 cm MV A velocity: 49.30 cm/s MV E/A ratio:  2.23 Ida Rogue MD Electronically signed by Ida Rogue MD Signature Date/Time: 03/24/2022/5:58:18 PM    Final       Time coordinating discharge: over 30 minutes  SIGNED:  Emeterio Reeve DO [facility]

## 2022-03-26 NOTE — Evaluation (Signed)
Physical Therapy Evaluation Patient Details Name: [name] MRN: [id number] DOB: Apr 22, 1941 Today's Date: 03/26/2022  History of Present Illness  [name] is a 81 y.o. male with past medical history of COPD with chronic hypoxic respiratory failure not currently smoking (quit >10 years ago), PAfib on coumadin, HFrEF, RAS, CAD s/p CABG, HTN, hypothyroidism, AS, DVT, recent hospitalization about 6 weeks ago for fall with rib fractures, complicated by pneumothorax requiring chest tube placement, diastolic heart failure. He came to ED 03/23/22 from cardiology office w/ Dr Quentin Ore with shortness of breath, LE edema, and fatigue all worsening x6 weeks (since discharge).  Admitted for acute on chronic diastolic HF w/ significant volume overload.  Clinical Impression  Pt eager to try activity w/o O2 but after ~100 ft sats consistently in the mid 80s and needed to place on 2L to maintain high 80s/low 90s.  Pt moved well with safety and confidence, negotiated steps w/o assist. Pt does not require HHPT, but would likely benefit from a monitored exercise program like [facility].  No further PT needs at this time.     Recommendations for follow up therapy are one component of a multi-disciplinary discharge planning process, led by the attending physician.  Recommendations may be updated based on patient status, additional functional criteria and insurance authorization.  Follow Up Recommendations  ([facility] - type program)      Assistance Recommended at Discharge PRN  Patient can return home with the following       Equipment Recommendations None recommended by PT  Recommendations for Other Services       Functional Status Assessment Patient has had a recent decline in their functional status and demonstrates the ability to make significant improvements in function in a reasonable and predictable amount of time.     Precautions / Restrictions Precautions Precautions:  Fall Restrictions Weight Bearing Restrictions: No      Mobility  Bed Mobility Overal bed mobility: Independent                  Transfers Overall transfer level: Modified independent Equipment used: None, Rolling walker (2 wheels) Transfers: Sit to/from Stand Sit to Stand: Supervision           General transfer comment: rises with confidence, minimal reliance on UEs    Ambulation/Gait Ambulation/Gait assistance: Supervision Gait Distance (Feet): 300 Feet Assistive device: Rolling walker (2 wheels)         General Gait Details: Pt was able to ambulate with good confidence, no LOBs or unsteadiness.  He was motivated to try w/o O2, sats staying 87% or higher for first ~100 ft but then into mid 80s and finally donned 2L, O2 improved but rarely >92% even on O2.  Stairs Stairs: Yes Stairs assistance: Supervision Stair Management: One rail Right Number of Stairs: 8 General stair comments: Pt was able to reciprocally negotiate steps.  Appropriate UE reliance, some fatigue w/o excessive SOB  Wheelchair Mobility    Modified Rankin (Stroke Patients Only)       Balance Overall balance assessment: Needs assistance Sitting-balance support: No upper extremity supported, Feet supported Sitting balance-Leahy Scale: Normal     Standing balance support: No upper extremity supported, During functional activity Standing balance-Leahy Scale: Good Standing balance comment: did request RW for prolonged ambulation w/o need for UEs with static standing in room acts  Pertinent Vitals/Pain Pain Assessment Pain Assessment: No/denies pain    Home Living Family/patient expects to be discharged to:: Private residence Living Arrangements: Spouse/significant other Available Help at Discharge: Family;Available 24 hours/day Type of Home: House Home Access: Stairs to enter Entrance Stairs-Rails: Right;Left Entrance Stairs-Number of Steps:  2 Alternate Level Stairs-Number of Steps: flight Home Layout: Two level;Able to live on main level with bedroom/bathroom Home Equipment: Rolling Walker (2 wheels);Cane - single point;Shower seat;Wheelchair - manual;Grab bars - tub/shower Additional Comments: does not use second level    Prior Function Prior Level of Function : Independent/Modified Independent;Driving             Mobility Comments: 1 fall in 6 months ADLs Comments: wife is blind, does IADLs, pt drives     Hand Dominance   Dominant Hand: Left    Extremity/Trunk Assessment   Upper Extremity Assessment Upper Extremity Assessment: Overall WFL for tasks assessed    Lower Extremity Assessment Lower Extremity Assessment: Overall WFL for tasks assessed       Communication   Communication: No difficulties  Cognition Arousal/Alertness: Awake/alert Behavior During Therapy: WFL for tasks assessed/performed Overall Cognitive Status: Within Functional Limits for tasks assessed                                          General Comments General comments (skin integrity, edema, etc.): Wanting to trial w/o O2 and able to maintain high 80s with minimal activity but decreased to mid 80s with more prolonged acts and needing 2-3 L to maintain high 80/low 90s    Exercises     Assessment/Plan    PT Assessment Patient does not need any further PT services  PT Problem List Decreased activity tolerance       PT Treatment Interventions Gait training;Stair training;Therapeutic exercise;Patient/family education    PT Goals (Current goals can be found in the Care Plan section)  Acute Rehab PT Goals Patient Stated Goal: go home ASAP PT Goal Formulation: All assessment and education complete, DC therapy    Frequency       Co-evaluation               AM-PAC PT "6 Clicks" Mobility  Outcome Measure Help needed turning from your back to your side while in a flat bed without using bedrails?:  None Help needed moving from lying on your back to sitting on the side of a flat bed without using bedrails?: None Help needed moving to and from a bed to a chair (including a wheelchair)?: None Help needed standing up from a chair using your arms (e.g., wheelchair or bedside chair)?: None Help needed to walk in hospital room?: None Help needed climbing 3-5 steps with a railing? : None 6 Click Score: 24    End of Session Equipment Utilized During Treatment: Gait belt Activity Tolerance: Patient tolerated treatment well Patient left: in bed;with family/visitor present Nurse Communication: Mobility status (O2) PT Visit Diagnosis: Unsteadiness on feet (R26.81)    Time: 5638-9373 PT Time Calculation (min) (ACUTE ONLY): 17 min   Charges:   PT Evaluation $PT Eval Low Complexity: 1 Low          Kreg Shropshire, DPT 03/26/2022, 11:44 AM

## 2022-03-26 NOTE — Progress Notes (Signed)
Discharge instructions/med list/CHF teaching/low salt diet education provided and reviewed with patient. SO, and son.  Pt Dc'd with his personal 02 tank on 3L [place].  F/U appointments set up.  Tele and IV removed.  All personal belongings returned.

## 2022-03-26 NOTE — Evaluation (Signed)
Occupational Therapy Evaluation Patient Details Name: [name] MRN: [id number] DOB: 1941/03/16 Today's Date: 03/26/2022   History of Present Illness [name] is a 81 y.o. male with past medical history of COPD with chronic hypoxic respiratory failure not currently smoking (quit >10 years ago), PAfib on coumadin, HFrEF, RAS, CAD s/p CABG, HTN, hypothyroidism, AS, DVT, recent hospitalization about 6 weeks ago for fall with rib fractures, complicated by pneumothorax requiring chest tube placement, diastolic heart failure. He came to ED 03/23/22 from cardiology office w/ Dr Quentin Ore with shortness of breath, LE edema, and fatigue all worsening x6 weeks (since discharge).  Admitted for acute on chronic diastolic HF w/ significant volume overload.   Clinical Impression   [name] was seen for OT evaluation this date. Prior to hospital admission, pt was MOD I using O2 and RW as needed. Pt lives with spouse in 2 level home c 2 STE, lives on main floor. Pt presents to acute OT demonstrating impaired ADL performance and functional mobility 2/2 decreased activity tolerance. Pt currently requires SUPERVISION standing grooming tasks improves to MOD I using RW for functional mobility >200 ft. SpO2 90% on 2L [place] at rest, pt requesting to trial mobility on RA per baseline, desat 84% on RA, resolved to 89% on 2L [place] during mobility. Educated on ECS, good return verbalization, no skilled acute OT needs, will sign off. Upon hospital discharge, recommend no OT follow up.     Recommendations for follow up therapy are one component of a multi-disciplinary discharge planning process, led by the attending physician.  Recommendations may be updated based on patient status, additional functional criteria and insurance authorization.   Follow Up Recommendations  No OT follow up (cardiopulmonary rehab in near future)     Assistance Recommended at Discharge Set up Supervision/Assistance  Patient can return home with  the following Help with stairs or ramp for entrance;Assist for transportation    Functional Status Assessment  Patient has had a recent decline in their functional status and demonstrates the ability to make significant improvements in function in a reasonable and predictable amount of time.  Equipment Recommendations       Recommendations for Other Services       Precautions / Restrictions Precautions Precautions: Fall Restrictions Weight Bearing Restrictions: No      Mobility Bed Mobility Overal bed mobility: Independent                  Transfers Overall transfer level: Needs assistance Equipment used: None Transfers: Sit to/from Stand Sit to Stand: Supervision           General transfer comment: minor LOB initial standing, self-corrects      Balance Overall balance assessment: Needs assistance Sitting-balance support: No upper extremity supported, Feet supported Sitting balance-Leahy Scale: Normal     Standing balance support: No upper extremity supported, During functional activity Standing balance-Leahy Scale: Good                             ADL either performed or assessed with clinical judgement   ADL Overall ADL's : Needs assistance/impaired                                       General ADL Comments: SUPERVISION standing grooming tasks improves to MOD I using RW for functional mobility >200 ft.  Pertinent Vitals/Pain Pain Assessment Pain Assessment: No/denies pain     Hand Dominance Left   Extremity/Trunk Assessment Upper Extremity Assessment Upper Extremity Assessment: Overall WFL for tasks assessed   Lower Extremity Assessment Lower Extremity Assessment: Overall WFL for tasks assessed       Communication Communication Communication: No difficulties   Cognition Arousal/Alertness: Awake/alert Behavior During Therapy: WFL for tasks assessed/performed Overall Cognitive Status: Within  Functional Limits for tasks assessed                                       General Comments  SpO2 90% on 2L [place] at rest, pt requestin to trial mobility on RA per baseline, desat 84% on RA, resolved to 89% on 2L [place] during mobility    Exercises Exercises: Other exercises Other Exercises Other Exercises: eudcated on energy conservation   Shoulder Instructions      Home Living Family/patient expects to be discharged to:: Private residence Living Arrangements: Spouse/significant other Available Help at Discharge: Family;Available 24 hours/day Type of Home: House Home Access: Stairs to enter [employer] of Steps: 2 Entrance Stairs-Rails: Right;Left Home Layout: Two level;Able to live on main level with bedroom/bathroom Alternate Level Stairs-Number of Steps: flight             Home Equipment: Conservation officer, nature (2 wheels);Cane - single point;Shower seat;Wheelchair - manual;Grab bars - tub/shower   Additional Comments: does not use second level      Prior Functioning/Environment Prior Level of Function : Independent/Modified Independent;Driving             Mobility Comments: 1 fall in 6 months ADLs Comments: wife is blind, does IADLs, pt drives        OT Problem List: Decreased activity tolerance;Impaired balance (sitting and/or standing);Cardiopulmonary status limiting activity         OT Goals(Current goals can be found in the care plan section) Acute Rehab OT Goals Patient Stated Goal: to go home OT Goal Formulation: With patient Time For Goal Achievement: 04/09/22 Potential to Achieve Goals: Good   AM-PAC OT "6 Clicks" Daily Activity     Outcome Measure Help from another person eating meals?: None Help from another person taking care of personal grooming?: None Help from another person toileting, which includes using toliet, bedpan, or urinal?: A Little Help from another person bathing (including washing, rinsing, drying)?: A  Little Help from another person to put on and taking off regular upper body clothing?: None Help from another person to put on and taking off regular lower body clothing?: None 6 Click Score: 22   End of Session Equipment Utilized During Treatment: Rolling walker (2 wheels)  Activity Tolerance: Patient tolerated treatment well Patient left: in bed;with call bell/phone within reach  OT Visit Diagnosis: Other abnormalities of gait and mobility (R26.89)                Time: 5102-5852 OT Time Calculation (min): 23 min Charges:  OT General Charges $OT Visit: 1 Visit OT Evaluation $OT Eval Low Complexity: 1 Low OT Treatments $Self Care/Home Management : 8-22 mins  Dessie Coma, M.S. OTR/L  03/26/22, 8:47 AM  ascom [phone]

## 2022-03-27 ENCOUNTER — Other Ambulatory Visit: Payer: Self-pay | Admitting: Cardiovascular Disease

## 2022-03-30 ENCOUNTER — Other Ambulatory Visit
Admission: RE | Admit: 2022-03-30 | Discharge: 2022-03-30 | Disposition: A | Discharge status: Home / Self Care | Payer: Medicare Other | Source: Ambulatory Visit | Attending: Family | Admitting: Family

## 2022-03-30 ENCOUNTER — Encounter: Payer: Self-pay | Admitting: Family

## 2022-03-30 ENCOUNTER — Other Ambulatory Visit: Payer: Self-pay | Admitting: *Deleted

## 2022-03-30 ENCOUNTER — Ambulatory Visit (HOSPITAL_BASED_OUTPATIENT_CLINIC_OR_DEPARTMENT_OTHER): Payer: Medicare Other | Admitting: Family

## 2022-03-30 VITALS — BP 106/52 | HR 86 | Resp 18 | Wt 174.4 lb

## 2022-03-30 DIAGNOSIS — J439 Emphysema, unspecified: Secondary | ICD-10-CM | POA: Diagnosis not present

## 2022-03-30 DIAGNOSIS — I10 Essential (primary) hypertension: Secondary | ICD-10-CM

## 2022-03-30 DIAGNOSIS — I350 Nonrheumatic aortic (valve) stenosis: Secondary | ICD-10-CM | POA: Diagnosis not present

## 2022-03-30 DIAGNOSIS — I5032 Chronic diastolic (congestive) heart failure: Secondary | ICD-10-CM | POA: Insufficient documentation

## 2022-03-30 DIAGNOSIS — I255 Ischemic cardiomyopathy: Secondary | ICD-10-CM

## 2022-03-30 DIAGNOSIS — S270XXD Traumatic pneumothorax, subsequent encounter: Secondary | ICD-10-CM | POA: Diagnosis not present

## 2022-03-30 DIAGNOSIS — I480 Paroxysmal atrial fibrillation: Secondary | ICD-10-CM | POA: Diagnosis not present

## 2022-03-30 DIAGNOSIS — J4489 Other specified chronic obstructive pulmonary disease: Secondary | ICD-10-CM | POA: Diagnosis not present

## 2022-03-30 DIAGNOSIS — J9601 Acute respiratory failure with hypoxia: Secondary | ICD-10-CM | POA: Diagnosis not present

## 2022-03-30 DIAGNOSIS — S2241XD Multiple fractures of ribs, right side, subsequent encounter for fracture with routine healing: Secondary | ICD-10-CM | POA: Diagnosis not present

## 2022-03-30 DIAGNOSIS — T797XXD Traumatic subcutaneous emphysema, subsequent encounter: Secondary | ICD-10-CM | POA: Diagnosis not present

## 2022-03-30 LAB — BASIC METABOLIC PANEL
Anion gap: 9 (ref 5–15)
BUN: 29 mg/dL — ABNORMAL HIGH (ref 8–23)
CO2: 29 mmol/L (ref 22–32)
Calcium: 8.7 mg/dL — ABNORMAL LOW (ref 8.9–10.3)
Chloride: 103 mmol/L (ref 98–111)
Creatinine, Ser: 1.42 mg/dL — ABNORMAL HIGH (ref 0.61–1.24)
GFR, Estimated: 50 mL/min — ABNORMAL LOW (ref >60)
Glucose, Bld: 75 mg/dL (ref 70–99)
Potassium: 3.6 mmol/L (ref 3.5–5.1)
Sodium: 141 mmol/L (ref 135–145)

## 2022-03-30 NOTE — Patient Instructions (Addendum)
Continue weighing daily and call for an overnight weight gain of 3 pounds or more or a weekly weight gain of more than 5 pounds.   If you have voicemail, please make sure your mailbox is cleaned out so that we may leave a message and please make sure to listen to any voicemails.    I have put the referral in for pulmonary rehab and they will call you.

## 2022-03-30 NOTE — Progress Notes (Signed)
Patient ID: [name], male    DOB: 12-08-1940, 81 y.o.   MRN: [id number]  HPI  Mr Appleyard is a 81 y/o male with a history of AAA, carotid disease, CAD, HTN, hyperlipidemia, thyroid disease, CKD, renal artery stenosis, COPD, intraventricular hemorrhage, AS, PAF, PAD, DVT, subclavian artery stenosis, tobacco use and chronic heart failure.   Echo report from 03/24/22 showed an EF of 50-55% along with mild/moderate MR, moderate AS with severe calcification of aortic valve.   RHC/LHC done 03/15/21 showed:   Ost LM to Dist LM lesion is 100% stenosed.   Prox RCA to Mid RCA lesion is 100% stenosed.   Ost RCA to Prox RCA lesion is 90% stenosed.   SVG graft was visualized by angiography and is normal in caliber.   SVG graft was visualized by angiography and is normal in caliber.   LIMA graft was visualized by angiography and is normal in caliber.   The graft exhibits no disease.   The graft exhibits no disease.  1.  Occluded native vessels including left main and mid right coronary artery.  Patent and normal grafts including SVG to right PDA, SVG to OM 2 and free LIMA to LAD which is anastomosed to the proximal portion of SVG to OM. 2.  Moderate aortic stenosis with mean gradient of 15 mmHg and valve area of 1.1 cm. 3.  Right heart catheterization showed moderately elevated wedge pressure, mild pulmonary hypertension and mildly reduced cardiac output.  Admitted 03/23/22 due to acute on chronic diastolic HF w/ significant volume overload. Cardiology consult obtained. Initially given IV lasix with transition to oral diuretics. Given solumedrol with transition to oral prednisone. Discharged after 3 days.   He presents today for his initial visit with a chief complaint of moderate fatigue with minimal exertion. Describes this as having been present for the last 10 weeks. He has associated cough, shortness of breath, abdominal distention, pedal edema, light-headedness, weakness and anxiety along with  this. Denies any difficulty sleeping, palpitations, pedal edema, chest pain or weight gain.   He says that ~ 10 weeks ago, he tripped over a cord, fell, broke some ribs and ended up with collapsed lung with a chest tube. He was in the bed for about a week and ever since then, he just hasn't felt good. Prior to this incident, he was mowing the yard and able to go shopping etc and now he can't because he feels so weak.   Past Medical History:  Diagnosis Date   ([place]) heart failure with improved ejection fraction ([place])    a. 06/2011 Echo: EF 35-40%; b. 06/2012 Echo: EF 50%; c. 12/2016 Echo: EF 55-60%, no rwma, GRI DD, mild AS/MR, nl RV fxn, nl RVSP; d. 02/2021 Echo: EF 55%, GrII DD. Mild MR. Mild to mod TR. Sev AS by VTI.   AAA (abdominal aortic aneurysm) ([place])    a. 05/2020 Abd u/s: 3.6cm.   Aortic calcification (HCC) 05/01/2013   Bilateral renal artery stenosis ([place])    a. 06/2013 Angio/PTA: LRA: 95 (5x18 Herculink stent), RRA 60ost; b. 04/2021 Angio: mod RRA stenosis w/ patent LRA stent.   Carotid arterial disease ([place])    a. 05/2020 Carotid U/S: RICA 0-53%, LICA 97-67%   CHF (congestive heart failure) (HCC)    Chronic kidney disease    COPD (chronic obstructive pulmonary disease) ([place])    Coronary artery disease    a. 2013 s/p CABG x 3 (LIMA->LAD, VG->OM, VG->RPDA; b. 06/2012 MV: no ischemia; c.  02/2021 Cath: LM 100, RCA 90ost, 14m free LIMA->LAD nl, VG->OM2 nl, VG->RPDA nl. Mod AS (mean grad 196mg, AVA 1.1cm^2). RHC w/ elev filling pressures.   History of prior cigarette smoking 04/11/2008   Qualifier: Diagnosis of  By: BeDiona BrownerD, Amy     Hyperlipidemia    Hypertension    Hypothyroidism    Intraventricular hemorrhage (HC[facility]03/20/2014   Ischemic cardiomyopathy    a. 06/2011 Echo: EF 35-40%; b. 06/2012 Echo: EF 50%; c. 12/2016 Echo: EF 55-60%, no rwma, GRI DD; d. 02/2021 Echo: Ef 55%, GrII DD.   Moderate aortic stenosis    a. 02/2021 Echo: EF 55%, GrII DD. Sev Ca2+ of AoV. Sev AS w/ AVA by  VTI 0.79cm^2; b. 02/2021 Cath: Mod AS w/ mean grad 1550m. AVA 1.1cm^2.   PAF (paroxysmal atrial fibrillation) ([facility]  Peripheral arterial disease ([facility]  a. Previous left lower extremity stenting by Dr. SanJamal Collinb. 12/2012 s/p bilat ostial common iliac stenting; c. 04/2021 Angio: Patent LRA stent, mod RRA stenosis. Small AAA. Sev plaque RSFA 2/ subtl occl of inf branch of profunda. Diff Ca2+ SFA dzs w/ collats from profunda and 3V runoff. Diffuse LSFA dzs w/ collats from profunda. Subtl PT dzs. 3V runoff-->Med Rx.   Right leg DVT ([facility]CC[facility]4/21/2014   Subclavian artery stenosis, left ([facility]CC[facility]2/2014   Status post stenting of the ostium and self-expanding stent placement to the left axillary artery   Venous insufficiency    Past Surgical History:  Procedure Laterality Date   ABDOMINAL ANGIOGRAM  07/10/2013   WITH BI-FEMORAL RUNOFF       DR ARIFletcher AnonABDOMINAL AORTAGRAM N/A 12/26/2012   Procedure: ABDOMINAL AORMaxcine HamSurgeon: MuhWellington HampshireD;  [facility];  Service: Cardiovascular;  Laterality: N/A;   ABDOMINAL AORTAGRAM N/A 07/10/2013   Procedure: ABDOMINAL AORMaxcine HamSurgeon: MuhWellington HampshireD;  [facility];  Service: Cardiovascular;  Laterality: N/A;   ABDOMINAL AORTOGRAM W/LOWER EXTREMITY N/A 05/12/2021   Procedure: ABDOMINAL AORTOGRAM W/LOWER EXTREMITY;  Surgeon: AriWellington HampshireD;  [facility];  Service: Cardiovascular;  Laterality: N/A;   ANGIOPLASTY / STENTING FEMORAL  05/2012   ANGIOPLASTY / STENTING ILIAC Bilateral 12/26/2012   ARCH AORTOGRAM N/A 06/20/2012   Procedure: ARCH AORTOGRAM;  Surgeon: MuhWellington HampshireD;  [facility];  Service: Cardiovascular;  Laterality: N/A;   CARDIAC CATHETERIZATION  05/2012   [facility]  [facility]   CORONARY ARTERY BYPASS GRAFT  07/11/2011   Procedure: CORONARY ARTERY BYPASS GRAFTING (CABG);  Surgeon: EdwGrace IsaacD;  [facility];  Laterality:  N/A;  Times 3. On Pump. Using right greater saphenous vein and left internal mammary artery.    CORONARY ARTERY BYPASS GRAFT     HERNIA REPAIR     INSERTION OF ILIAC STENT Bilateral 12/26/2012   Procedure: INSERTION OF ILIAC STENT;  Surgeon: MuhWellington HampshireD;  [facility];  Service: Cardiovascular;  Laterality: Bilateral;  Bilateral Common Iliac Artery   LEFT AND RIGHT HEART CATHETERIZATION WITH CORONARY ANGIOGRAM  07/07/2011   Procedure: LEFT AND RIGHT HEART CATHETERIZATION WITH CORONARY ANGIOGRAM;  Surgeon: TimMinna MerrittsD;  [facility];  Service: Cardiovascular;;   LOWER EXTREMITY VENOGRAPHY Bilateral 12/30/2016   Procedure: Lower Extremity Venography;  Surgeon: SchKatha CabalD;  [facility];  Service: Cardiovascular;  Laterality: Bilateral;  PERCUTANEOUS STENT INTERVENTION  06/20/2012   Procedure: PERCUTANEOUS STENT INTERVENTION;  Surgeon: Wellington Hampshire, MD;  Location: [facility];  Service: Cardiovascular;;   RENAL ARTERY ANGIOPLASTY Left 07/10/2013   DR ARIDA   RIGHT/LEFT HEART CATH AND CORONARY/GRAFT ANGIOGRAPHY N/A 03/15/2021   Procedure: RIGHT/LEFT HEART CATH AND CORONARY/GRAFT ANGIOGRAPHY;  Surgeon: Wellington Hampshire, MD;  Location: [facility];  Service: Cardiovascular;  Laterality: N/A;   SUBCLAVIAN ARTERY STENT  05/2012   "2 stents" (12/26/2012)   TOOTH EXTRACTION  Spring 2017   mulitple (6)   VENA CAVA FILTER PLACEMENT  07/2012   Removable   Family History  Problem Relation Age of Onset   Alcohol abuse Father    Cirrhosis Father    Hypothyroidism Sister    Social History   Tobacco Use   Smoking status: Former    Packs/day: 1.00    Years: 60.00    Total pack years: 60.00    Types: Cigarettes    Quit date: 12/25/2010    Years since quitting: 11.2   Smokeless tobacco: Never  Substance Use Topics   Alcohol use: No   Allergies  Allergen Reactions   Plavix [Clopidogrel Bisulfate]     Brain hemorrhage  prev while on plavix and aspirin   Prior to Admission medications   Medication Sig Start Date End Date Taking? Authorizing Provider  albuterol (VENTOLIN HFA) 108 (90 Base) MCG/ACT inhaler Inhale 2 puffs into the lungs every 6 (six) hours as needed for wheezing or shortness of breath. 03/25/22  Yes Emeterio Reeve, DO  atorvastatin (LIPITOR) 20 MG tablet TAKE 1 TABLET BY MOUTH EVERY DAY 12/24/21  Yes Wellington Hampshire, MD  augmented betamethasone dipropionate (DIPROLENE-AF) 0.05 % cream APPLY TO AFFECTED AREA TWICE A DAY 03/14/22  Yes [provider]  carvedilol (COREG) 6.25 MG tablet TAKE 1 TABLET BY MOUTH TWICE A DAY WITH MEALS 03/27/22  Yes Copland, Frederico Hamman, MD  cilostazol (PLETAL) 50 MG tablet TAKE 1 TABLET BY MOUTH TWICE A DAY 03/28/22  Yes Wellington Hampshire, MD  furosemide (LASIX) 40 MG tablet Take 1 tablet (40 mg total) by mouth daily. Take extra 20-40 mg (0.5-1 tablet for total daily dose 60-80 mg) orally as needed for weight gain of 3 pounds overnight or 5 pounds in one week, lower leg swelling with shortness of breath 03/26/22  Yes Emeterio Reeve, DO  levothyroxine (SYNTHROID) 150 MCG tablet TAKE ONE TABLET BY MOUTH EVERY DAY BEFORE BREAKFAST 08/03/21  Yes Copland, Frederico Hamman, MD  umeclidinium-vilanterol (ANORO ELLIPTA) 62.5-25 MCG/ACT AEPB Inhale 1 puff into the lungs daily. 03/25/22  Yes Emeterio Reeve, DO  warfarin (COUMADIN) 5 MG tablet TAKE 1/2 TABLET DAILY BY MOUTH EXCEPT TAKE 1 TABLET ON MON, WED, FRI OR AS DIRECTED BY CLINIC 03/27/22  Yes Copland, Frederico Hamman, MD   Review of Systems  Constitutional:  Positive for fatigue (easily). Negative for appetite change.  HENT:  Negative for congestion, postnasal drip and sore throat.   Eyes: Negative.   Respiratory:  Positive for cough (dry) and shortness of breath (easily). Negative for chest tightness.   Cardiovascular:  Positive for leg swelling. Negative for chest pain and palpitations.  Gastrointestinal:  Positive for abdominal  distention. Negative for abdominal pain.  Endocrine: Negative.   Genitourinary: Negative.   Musculoskeletal:  Negative for back pain and neck pain.  Skin: Negative.   Allergic/Immunologic: Negative.   Neurological:  Positive for weakness and light-headedness ("unsteady"). Negative for dizziness.  Hematological:  Negative for adenopathy. Bruises/bleeds  easily.  Psychiatric/Behavioral:  Negative for dysphoric mood and sleep disturbance (sleeping on 2 pillows). The patient is nervous/anxious.    Vitals:   03/30/22 0933  BP: (!) 106/52  Pulse: 86  Resp: 18  SpO2: 96%  Weight: 174 lb 6 oz (79.1 kg)   Wt Readings from Last 3 Encounters:  03/30/22 174 lb 6 oz (79.1 kg)  03/26/22 179 lb 0.2 oz (81.2 kg)  03/23/22 175 lb (79.4 kg)   Lab Results  Component Value Date   CREATININE 1.42 (H) 03/30/2022   CREATININE 1.27 (H) 03/26/2022   CREATININE 1.43 (H) 03/25/2022   Physical Exam Vitals and nursing note reviewed. Exam conducted with a chaperone present (wife).  Constitutional:      Appearance: Normal appearance.  HENT:     Head: Normocephalic and atraumatic.  Cardiovascular:     Rate and Rhythm: Normal rate and regular rhythm.  Pulmonary:     Effort: Pulmonary effort is normal. No respiratory distress.     Breath sounds: No wheezing or rales.  Abdominal:     General: There is distension.     Palpations: Abdomen is soft.  Musculoskeletal:        General: No tenderness.     Cervical back: Normal range of motion and neck supple.     Right lower leg: Edema (2+ pitting) present.     Left lower leg: Edema (2+ pitting) present.  Skin:    General: Skin is warm and dry.  Neurological:     General: No focal deficit present.     Mental Status: He is alert and oriented to person, place, and time.  Psychiatric:        Mood and Affect: Mood normal.        Behavior: Behavior normal.        Thought Content: Thought content normal.   Assessment & Plan:  1: Chronic heart failure with  preserved ejection fraction without structural changes- - NYHA class III - euvolemic today - weighing daily & home weight ranges from 169-170 pounds; reminded to call for an overnight weight gain of > 2 pounds or a weekly weight gain of > 5 pounds - not adding salt but admits that he "loves" salt; doesn't like Mrs. Dash seasoning; patient and wife are reading food labels for sodium content  - currently wearing compression socks daily; encouraged to elevate his legs when sitting for long periods of time - does feel like his edema is a little worse today - drinking 4 glasses of water, 3-4 cups of coffee/ tea daily but unsure of how many ounces these glasses/cups hold; reviewed the importance of measuring the ounces of these glasses so that he can keep his daily fluid intake to 60-64 ounces - will refer to Dr. Haroldine Laws for further evaluation - referral placed for pulmonary rehab; he says that his muscles are very weak and he's been through cardiac rehab in the past - will check BMP today and see if he can take any additional diuretic or not - BNP 03/23/22 was 630.4 - has received his pneumonia/ covid vaccines for this season but hasn't gotten his flu vaccine yet  2: HTN with CKD- - BP looks good (106/52) - saw PCP (Copland) 02/16/22 - BMP 03/26/22 reviewed and showed sodium 143, potassium 3.4, creatinine 1.27 & GFR 57 - rechecking BMP today  3: Aortic stenosis- - saw cardiology Sharolyn Douglas) 01/17/22 - does have oxygen at home that he can wear PRN; says that he rarely uses  it - says that they've know about the aortic valve issue but that it's just been watched  4: PAF- - saw EP Quentin Ore) 03/23/22   Medication list reviewed.   Return in 1 week, sooner if needed.

## 2022-03-31 ENCOUNTER — Telehealth: Payer: Self-pay

## 2022-03-31 ENCOUNTER — Other Ambulatory Visit (HOSPITAL_COMMUNITY): Payer: Self-pay

## 2022-03-31 ENCOUNTER — Telehealth: Payer: Self-pay | Admitting: Cardiovascular Disease

## 2022-03-31 NOTE — Telephone Encounter (Signed)
New Message:      Patient's wife called about patient's  post hospital appointmnet. She said that Dr Fletcher Anon said he wanted him to see patient for follow up visit. The first available appointment that Dr Fletcher Anon have is 05-03-22. She thought Dr Fletcher Anon wanted to see him before that date. Please see if Dr Fletcher Anon thinks 05-03-22 is alright or does he want an APP to see him sooner?

## 2022-03-31 NOTE — Telephone Encounter (Signed)
Spoke to the patient and wife. An appointment has been made with Dr. Fletcher Anon on 1/9. The patient is seeing CHF clinic on 12/14 for a follow up as well.

## 2022-03-31 NOTE — Telephone Encounter (Signed)
Pt is due for INR check. Contacted pt's wife, Joycelyn Schmid, and she reported pt's recent hospitalization. She report pt is doing better and edema has decreased but he has [place] and she said he did still have a small amount of edema in lower legs. She reports pt is now going to CHF clinic and working closely with them. They have a number for that office and were advised to contact if any questions concerning his edema.  Advised [name] that edema can raise INR and place pt at increased risk of bleeding. Advised INR should be tested as soon as possible. She reports he is no longer driving and she does not drive and that he gets easily fatigued if he has a lot to do. She reports family will be taking him to CHF clinic on 12/14 and they could come to [facility] after that apt around 3 pm. Advised pt will be placed on the coumadin clinic apt and if any changes before that apt to contact office. Advised if any signs or symptoms of bleeding to go to the ER or call 911 for transportation. [name] verbalized understanding.  Placed pt on coumadin clinic schedule.

## 2022-04-07 ENCOUNTER — Ambulatory Visit: Payer: Medicare Other

## 2022-04-07 ENCOUNTER — Ambulatory Visit: Payer: Medicare Other | Attending: Internal Medicine | Admitting: Internal Medicine

## 2022-04-07 ENCOUNTER — Encounter: Payer: Self-pay | Admitting: Internal Medicine

## 2022-04-07 VITALS — BP 111/53 | HR 83 | Resp 20 | Wt 178.1 lb

## 2022-04-07 DIAGNOSIS — I10 Essential (primary) hypertension: Secondary | ICD-10-CM

## 2022-04-07 DIAGNOSIS — Z7901 Long term (current) use of anticoagulants: Secondary | ICD-10-CM | POA: Diagnosis not present

## 2022-04-07 DIAGNOSIS — Z8673 Personal history of transient ischemic attack (TIA), and cerebral infarction without residual deficits: Secondary | ICD-10-CM | POA: Diagnosis not present

## 2022-04-07 DIAGNOSIS — Z8679 Personal history of other diseases of the circulatory system: Secondary | ICD-10-CM | POA: Diagnosis not present

## 2022-04-07 DIAGNOSIS — I5033 Acute on chronic diastolic (congestive) heart failure: Secondary | ICD-10-CM

## 2022-04-07 DIAGNOSIS — I739 Peripheral vascular disease, unspecified: Secondary | ICD-10-CM | POA: Diagnosis not present

## 2022-04-07 DIAGNOSIS — I480 Paroxysmal atrial fibrillation: Secondary | ICD-10-CM

## 2022-04-07 DIAGNOSIS — I2510 Atherosclerotic heart disease of native coronary artery without angina pectoris: Secondary | ICD-10-CM

## 2022-04-07 DIAGNOSIS — I701 Atherosclerosis of renal artery: Secondary | ICD-10-CM | POA: Diagnosis not present

## 2022-04-07 DIAGNOSIS — N1831 Chronic kidney disease, stage 3a: Secondary | ICD-10-CM | POA: Diagnosis not present

## 2022-04-07 DIAGNOSIS — Z87891 Personal history of nicotine dependence: Secondary | ICD-10-CM | POA: Diagnosis not present

## 2022-04-07 DIAGNOSIS — Z951 Presence of aortocoronary bypass graft: Secondary | ICD-10-CM | POA: Diagnosis not present

## 2022-04-07 DIAGNOSIS — Z86718 Personal history of other venous thrombosis and embolism: Secondary | ICD-10-CM | POA: Insufficient documentation

## 2022-04-07 DIAGNOSIS — I5032 Chronic diastolic (congestive) heart failure: Secondary | ICD-10-CM | POA: Diagnosis not present

## 2022-04-07 DIAGNOSIS — I493 Ventricular premature depolarization: Secondary | ICD-10-CM | POA: Diagnosis not present

## 2022-04-07 DIAGNOSIS — R0602 Shortness of breath: Secondary | ICD-10-CM | POA: Insufficient documentation

## 2022-04-07 DIAGNOSIS — I130 Hypertensive heart and chronic kidney disease with heart failure and stage 1 through stage 4 chronic kidney disease, or unspecified chronic kidney disease: Secondary | ICD-10-CM | POA: Diagnosis not present

## 2022-04-07 DIAGNOSIS — Z955 Presence of coronary angioplasty implant and graft: Secondary | ICD-10-CM | POA: Insufficient documentation

## 2022-04-07 DIAGNOSIS — N183 Chronic kidney disease, stage 3 unspecified: Secondary | ICD-10-CM | POA: Diagnosis not present

## 2022-04-07 DIAGNOSIS — J449 Chronic obstructive pulmonary disease, unspecified: Secondary | ICD-10-CM | POA: Diagnosis not present

## 2022-04-07 DIAGNOSIS — I350 Nonrheumatic aortic (valve) stenosis: Secondary | ICD-10-CM | POA: Diagnosis not present

## 2022-04-07 LAB — BASIC METABOLIC PANEL
Anion gap: 2 — ABNORMAL LOW (ref 5–15)
BUN: 22 mg/dL (ref 8–23)
CO2: 29 mmol/L (ref 22–32)
Calcium: 8.4 mg/dL — ABNORMAL LOW (ref 8.9–10.3)
Chloride: 107 mmol/L (ref 98–111)
Creatinine, Ser: 1.47 mg/dL — ABNORMAL HIGH (ref 0.61–1.24)
GFR, Estimated: 48 mL/min — ABNORMAL LOW (ref >60)
Glucose, Bld: 94 mg/dL (ref 70–99)
Potassium: 4.2 mmol/L (ref 3.5–5.1)
Sodium: 138 mmol/L (ref 135–145)

## 2022-04-07 LAB — PROTIME-INR
INR: 3.4 — ABNORMAL HIGH (ref 0.8–1.2)
Prothrombin Time: 34 seconds — ABNORMAL HIGH (ref 11.4–15.2)

## 2022-04-07 LAB — BRAIN NATRIURETIC PEPTIDE: B Natriuretic Peptide: 693.1 pg/mL — ABNORMAL HIGH (ref 0.0–100.0)

## 2022-04-07 LAB — POCT INR: INR: 3.4 — AB (ref 2.0–3.0)

## 2022-04-07 MED ORDER — POTASSIUM CHLORIDE CRYS ER 20 MEQ PO TBCR: 40.0000 meq | EXTENDED_RELEASE_TABLET | Freq: Every day | ORAL | 0 refills | Status: DC

## 2022-04-07 MED ORDER — FUROSCIX 80 MG/10ML ~~LOC~~ CTKT: 80.0000 mg | CARTRIDGE | SUBCUTANEOUS | Status: DC

## 2022-04-07 MED ORDER — METOLAZONE 2.5 MG PO TABS: 2.5000 mg | ORAL_TABLET | Freq: Every day | ORAL | 0 refills | Status: DC

## 2022-04-07 NOTE — Progress Notes (Addendum)
ADVANCED HF CLINIC CONSULT NOTE  Referring Physician: Darylene Price, NP  Primary Care: Owens Loffler, MD Primary Cardiologist: [name], MD   HPI:  Mr Mahany is a 81 y/o male with a history of AAA, carotid disease, CAD s/p CABG, CKD 3a, renal artery stenosis, COPD, intraventricular hemorrhage, moderate AS, PAF, PAD, DVT, subclavian artery stenosis, tobacco use and chronic heart failure.   Underwent CABG x 3 in 2013 with placement of a LIMA to LAD, vein graft to the RPDA, and vein graft to the obtuse marginal.  He suffered an intracranial hemorrhage in 2014 which was suspected to be due to malignant hypertension and dual antiplatelet therapy.  Shortly thereafter, he was diagnosed with extensive right-sided DVT and underwent IVC filter in the setting of contraindication for anticoagulation.    In 2018, he had extensive bilateral occlusive DVTs, which required catheter-based therapy and he has been on anticoagulation since then with warfarin.    EF was previously as low as 35 to 40% prior to his bypass surgery with subsequent improvement to 55 to 60% by echo in 2018.   Cath 11/22 and showed 3 out of 3 patent grafts with known occlusive left main and RCA disease.  Aortic stenosis was moderate by gradient 15 mmHg and valve area 1.1 cm.  Right heart catheterization was notable for elevated filling pressures and Lasix 20 mg daily was resumed.  Due to worsening claudication at follow-up, he underwent repeat ABIs which were abnormal at 0.61 on the right and 0.56 on the left.  Lower extremity angiogram was performed January 2023 which showed patent left renal artery stent with moderate right renal artery stenosis, patent bilateral iliac kissing stents extending into the distal aorta with mild restenosis, subtotal occlusion of the inferior branch of the right profunda with diffuse calcified SFA disease and collaterals from the profunda and three-vessel runoff below the knee.     Admitted 10/23  after a fall found to have right rib fracture, pneumothorax and subcutaneous emphysema.  He initially presented to [facility] but was transferred to [facility] to the trauma service.  He was initially quiring 15 L of O2.  CT showed contusion workup was unremarkable, they recommended conservative management as the patient slowly started to require less and less oxygen.  Hospitalization the patient had A-fib with RVR, rate controlled with Coreg.     Admitted 03/23/22 due to acute on chronic diastolic HF w/ significant volume overload. Cardiology consult obtained. Initially given IV lasix with transition to oral diuretics. Discharged after 3 days. Weight 179 on 12/1    Echo report from 03/24/22 showed an EF of 50-55% along with mild/moderate MR, moderate AS with severe calcification of aortic valve.   Seen by Darylene Price, NP recently and was volume overloaded and referred here  Remains markedly SOB at rest and any exertion. Abdomen and legs swollen. Minimal response to lasix. No CP. + orthopnea or PND.    Review of Systems: [y] = yes, '[ ]'$  = no   General: Weight gain Blue.Reese ]; Weight loss '[ ]'$ ; Anorexia '[ ]'$ ; Fatigue Blue.Reese ]; Fever '[ ]'$ ; Chills '[ ]'$ ; Weakness '[ ]'$   Cardiac: Chest pain/pressure '[ ]'$ ; Resting SOB Blue.Reese ]; Exertional SOB [ y]; Orthopnea '[ ]'$ ; Pedal Edema Blue.Reese ]; Palpitations '[ ]'$ ; Syncope '[ ]'$ ; Presyncope '[ ]'$ ; Paroxysmal nocturnal dyspnea[ y]  Pulmonary: Cough '[ ]'$ ; Wheezing'[ ]'$ ; Hemoptysis'[ ]'$ ; Sputum '[ ]'$ ; Snoring '[ ]'$   GI: Vomiting'[ ]'$ ; Dysphagia'[ ]'$ ; Melena'[ ]'$ ; Hematochezia '[ ]'$ ;  Heartburn'[ ]'$ ; Abdominal pain '[ ]'$ ; Constipation '[ ]'$ ; Diarrhea '[ ]'$ ; BRBPR '[ ]'$   GU: Hematuria'[ ]'$ ; Dysuria '[ ]'$ ; Nocturia'[ ]'$   Vascular: Pain in legs with walking '[ ]'$ ; Pain in feet with lying flat '[ ]'$ ; Non-healing sores '[ ]'$ ; Stroke '[ ]'$ ; TIA '[ ]'$ ; Slurred speech '[ ]'$ ;  Neuro: Headaches'[ ]'$ ; Vertigo'[ ]'$ ; Seizures'[ ]'$ ; Paresthesias'[ ]'$ ;Blurred vision '[ ]'$ ; Diplopia '[ ]'$ ; Vision changes '[ ]'$   Ortho/Skin: Arthritis [ y]; Joint pain [ y]; Muscle pain '[ ]'$ ;  Joint swelling '[ ]'$ ; Back Pain '[ ]'$ ; Rash '[ ]'$   Psych: Depression'[ ]'$ ; Anxiety'[ ]'$   Heme: Bleeding problems '[ ]'$ ; Clotting disorders Blue.Reese ]; Anemia '[ ]'$   Endocrine: Diabetes '[ ]'$ ; Thyroid dysfunction'[ ]'$    Past Medical History:  Diagnosis Date   (HFimpEF) heart failure with improved ejection fraction ([place])    a. 06/2011 Echo: EF 35-40%; b. 06/2012 Echo: EF 50%; c. 12/2016 Echo: EF 55-60%, no rwma, GRI DD, mild AS/MR, nl RV fxn, nl RVSP; d. 02/2021 Echo: EF 55%, GrII DD. Mild MR. Mild to mod TR. Sev AS by VTI.   AAA (abdominal aortic aneurysm) ([place])    a. 05/2020 Abd u/s: 3.6cm.   Aortic calcification (HCC) 05/01/2013   Bilateral renal artery stenosis ([place])    a. 06/2013 Angio/PTA: LRA: 95 (5x18 Herculink stent), RRA 60ost; b. 04/2021 Angio: mod RRA stenosis w/ patent LRA stent.   Carotid arterial disease ([place])    a. 05/2020 Carotid U/S: RICA 3-32%, LICA 95-18%   CHF (congestive heart failure) (HCC)    Chronic kidney disease    COPD (chronic obstructive pulmonary disease) (HCC)    Coronary artery disease    a. 2013 s/p CABG x 3 (LIMA->LAD, VG->OM, VG->RPDA; b. 06/2012 MV: no ischemia; c. 02/2021 Cath: LM 100, RCA 90ost, 164m free LIMA->LAD nl, VG->OM2 nl, VG->RPDA nl. Mod AS (mean grad 153mg, AVA 1.1cm^2). RHC w/ elev filling pressures.   History of prior cigarette smoking 04/11/2008   Qualifier: Diagnosis of  By: BeDiona BrownerD, Amy     Hyperlipidemia    Hypertension    Hypothyroidism    Intraventricular hemorrhage ([place]03/20/2014   Ischemic cardiomyopathy    a. 06/2011 Echo: EF 35-40%; b. 06/2012 Echo: EF 50%; c. 12/2016 Echo: EF 55-60%, no rwma, GRI DD; d. 02/2021 Echo: Ef 55%, GrII DD.   Moderate aortic stenosis    a. 02/2021 Echo: EF 55%, GrII DD. Sev Ca2+ of AoV. Sev AS w/ AVA by VTI 0.79cm^2; b. 02/2021 Cath: Mod AS w/ mean grad 1527m. AVA 1.1cm^2.   PAF (paroxysmal atrial fibrillation) ([place]  Peripheral arterial disease (HCCRice  a. Previous left lower extremity stenting by Dr. SanJamal Collinb. 12/2012  s/p bilat ostial common iliac stenting; c. 04/2021 Angio: Patent LRA stent, mod RRA stenosis. Small AAA. Sev plaque RSFA 2/ subtl occl of inf branch of profunda. Diff Ca2+ SFA dzs w/ collats from profunda and 3V runoff. Diffuse LSFA dzs w/ collats from profunda. Subtl PT dzs. 3V runoff-->Med Rx.   Right leg DVT (HCCTerr[place]4/21/2014   Subclavian artery stenosis, left ([place]CC[place]/2014   Status post stenting of the ostium and self-expanding stent placement to the left axillary artery   Venous insufficiency     Current Outpatient Medications  Medication Sig Dispense Refill   albuterol (VENTOLIN HFA) 108 (90 Base) MCG/ACT inhaler Inhale 2 puffs into the lungs every 6 (six) hours as needed for wheezing or shortness of breath. 18 g 0  atorvastatin (LIPITOR) 20 MG tablet TAKE 1 TABLET BY MOUTH EVERY DAY 30 tablet 5   augmented betamethasone dipropionate (DIPROLENE-AF) 0.05 % cream APPLY TO AFFECTED AREA TWICE A DAY     carvedilol (COREG) 6.25 MG tablet TAKE 1 TABLET BY MOUTH TWICE A DAY WITH MEALS 180 tablet 1   cilostazol (PLETAL) 50 MG tablet TAKE 1 TABLET BY MOUTH TWICE A DAY 180 tablet 0   furosemide (LASIX) 40 MG tablet Take 1 tablet (40 mg total) by mouth daily. Take extra 20-40 mg (0.5-1 tablet for total daily dose 60-80 mg) orally as needed for weight gain of 3 pounds overnight or 5 pounds in one week, lower leg swelling with shortness of breath 45 tablet 0   levothyroxine (SYNTHROID) 150 MCG tablet TAKE ONE TABLET BY MOUTH EVERY DAY BEFORE BREAKFAST 90 tablet 1   warfarin (COUMADIN) 5 MG tablet TAKE 1/2 TABLET DAILY BY MOUTH EXCEPT TAKE 1 TABLET ON MON, WED, FRI OR AS DIRECTED BY CLINIC 65 tablet 3   umeclidinium-vilanterol (ANORO ELLIPTA) 62.5-25 MCG/ACT AEPB Inhale 1 puff into the lungs daily. (Patient not taking: Reported on 04/07/2022) 60 each 0   No current facility-administered medications for this visit.    Allergies  Allergen Reactions   Plavix [Clopidogrel Bisulfate]     Brain hemorrhage  prev while on plavix and aspirin      Social History   Socioeconomic History   Marital status: Married    Spouse name: Not on file   Number of children: 2   Years of education: Not on file   Highest education level: Not on file  Occupational History   Occupation: Retired    Fish farm manager: RETIRED    Comment: Land  Tobacco Use   Smoking status: Former    Packs/day: 1.00    Years: 60.00    Total pack years: 60.00    Types: Cigarettes    Quit date: 12/25/2010    Years since quitting: 11.2   Smokeless tobacco: Never  Vaping Use   Vaping Use: Never used  Substance and Sexual Activity   Alcohol use: No   Drug use: No   Sexual activity: Not Currently  Other Topics Concern   Not on file  Social History Narrative   Exercising 3 times a week.    Moderate diet control.    O living will, no HCPOA.   Social Determinants of Health   Financial Resource Strain: Low Risk  (07/05/2021)   Overall Financial Resource Strain (CARDIA)    Difficulty of Paying Living Expenses: Not very hard  Food Insecurity: No Food Insecurity (03/24/2022)   Hunger Vital Sign    Worried About Running Out of Food in the Last Year: Never true    Ran Out of Food in the Last Year: Never true  Transportation Needs: No Transportation Needs (03/24/2022)   PRAPARE - Hydrologist (Medical): No    Lack of Transportation (Non-Medical): No  Physical Activity: Insufficiently Active (07/05/2021)   Exercise Vital Sign    Days of Exercise per Week: 7 days    Minutes of Exercise per Session: 20 min  Stress: No Stress Concern Present (07/05/2021)   [place]    Feeling of Stress : Not at all  Social Connections: Moderately Isolated (07/05/2021)   Social Connection and Isolation Panel [NHANES]    Frequency of Communication with Friends and Family: More than three times a week  Frequency of Social Gatherings with  Friends and Family: More than three times a week    Attends Religious Services: Never    Marine scientist or Organizations: No    Attends Archivist Meetings: Never    Marital Status: Married  Human resources officer Violence: Not At Risk (03/24/2022)   Humiliation, Afraid, Rape, and Kick questionnaire    Fear of Current or Ex-Partner: No    Emotionally Abused: No    Physically Abused: No    Sexually Abused: No      Family History  Problem Relation Age of Onset   Alcohol abuse Father    Cirrhosis Father    Hypothyroidism Sister     Vitals:   04/07/22 1323  BP: (!) 111/53  Pulse: 83  Resp: 20  SpO2: 98%  Weight: 178 lb 2 oz (80.8 kg)    PHYSICAL EXAM: General:  Fatigued appearing SOB at rest  HEENT: normal Neck: supple. JVP to ear  Carotids 2+ bilat; no bruits. No lymphadenopathy or thryomegaly appreciated. Cor: Distant HS Regular rate & rhythm. Soft AS murmur Lungs: + crackles Abdomen: soft, nontender, ++ distended. No hepatosplenomegaly. No bruits or masses. Good bowel sounds. Extremities: no cyanosis, clubbing, rash, 3+ edema Neuro: alert & oriented x 3, cranial nerves grossly intact. moves all 4 extremities w/o difficulty. Affect pleasant  ASSESSMENT & PLAN:    1. Acute on chronic diastolic heart failure -Echo 02/2022 showed LVEF 55 to 60%, no wall motion abnormalities, normal RV systolic function, mild MR Moderate AS - He is massively volume overloaded and SOB - We discussed hospital admit but he is reluctant - Start Furoscix 80 mg SQ x 4 days with metolazone 2.5 x 2 days + KCL 40  - If deteriorating needs to call 911 - F/u next week  - Check labs next week - Further adjustment of meds once euvolemia established  2. Paroxysmal A-fib - In NSR - Continue warfarin   3. PVCs -Recent ZIO showed 17% PVC burden -Continue Coreg - will consider mexilitene for PVC suppression when HF more compenasated   4. Moderate aortic stenosis - AS moderate by  echo - continue to follow   5. CKD stage 3a - monitor Scr with diuresis - Baseline SCr 1.2-1.4  6. CAD s/p CABG and multiple PCIs - Grafts patent on cath 2022 - No current angina - Followed by Dr. Fletcher Anon   Total time spent 45 minutes. Over half that time spent discussing above.     Glori Bickers, MD  1:39 PM

## 2022-04-07 NOTE — Patient Instructions (Addendum)
Medication Changes:  HOLD lasix pill for 4 days Begin furoscix injections once daily. You will take this for the next 4 days Begin metolazone 2.5 mg for 2 days Begin taking potassium 40 meq daily    Your provider has order Furoscix for you. This is an on-body infuser that gives you a dose of Furosemide.   It will be shipped to your home   [employer] will call you to discuss before shipping so, PLEASE answer unknown calls  For questions regarding the device call [employer] at [phone]  Ensure you write down the time you start your infusion so that if there is a problem you will know how long the infusion lasted  Use Furoscix only AS DIRECTED by our office  Dosing Directions:   Day 1= Today. Use one furoscix injection kit and take 1 metolazone pill. Take 40 meq potassium  Day 2= Tomorrow. Use one furoscix injection kit and take second metolazone pill. Take 40 meq potassium    Day 3= Saturday. Use one furoscix injection kit and take 40 meq potassium.  Day 4= Sunday. Use one furoscix injection kit and take 40 meq potassium.   Day 5 = Resume your normal schedule of lasix tablets and come to your follow up appointment at 10 AM.   Lab Work:  Labs done today, your results will be available in [place], we will contact you for abnormal readings.   Testing/Procedures:  None  Referrals:  None  Special Instructions // Education:  Do the following things EVERYDAY: Weigh yourself in the morning before breakfast. Write it down and keep it in a log. Take your medicines as prescribed Eat low salt foods--Limit salt (sodium) to 2000 mg per day.  Stay as active as you can everyday Limit all fluids for the day to less than 2 liters   Follow-Up in: Next week,  Monday, 04/11/22 at 10 AM with Dr. Haroldine Laws     If you have any questions or concerns before your next appointment please send [place] a message through [facility] or call our office at [phone] Monday-Friday  8 am-5 pm.   If you have an urgent need after hours on the weekend please call your Primary Cardiologist or the [facility] in [place] at [phone].

## 2022-04-08 ENCOUNTER — Other Ambulatory Visit (HOSPITAL_COMMUNITY): Payer: Self-pay

## 2022-04-08 ENCOUNTER — Ambulatory Visit (INDEPENDENT_AMBULATORY_CARE_PROVIDER_SITE_OTHER): Payer: Medicare Other

## 2022-04-08 DIAGNOSIS — Z7901 Long term (current) use of anticoagulants: Secondary | ICD-10-CM | POA: Diagnosis not present

## 2022-04-08 DIAGNOSIS — J9601 Acute respiratory failure with hypoxia: Secondary | ICD-10-CM | POA: Diagnosis not present

## 2022-04-08 DIAGNOSIS — J4489 Other specified chronic obstructive pulmonary disease: Secondary | ICD-10-CM | POA: Diagnosis not present

## 2022-04-08 DIAGNOSIS — J439 Emphysema, unspecified: Secondary | ICD-10-CM | POA: Diagnosis not present

## 2022-04-08 DIAGNOSIS — S2241XD Multiple fractures of ribs, right side, subsequent encounter for fracture with routine healing: Secondary | ICD-10-CM | POA: Diagnosis not present

## 2022-04-08 DIAGNOSIS — S270XXD Traumatic pneumothorax, subsequent encounter: Secondary | ICD-10-CM | POA: Diagnosis not present

## 2022-04-08 DIAGNOSIS — T797XXD Traumatic subcutaneous emphysema, subsequent encounter: Secondary | ICD-10-CM | POA: Diagnosis not present

## 2022-04-08 NOTE — Progress Notes (Signed)
Pt recently had long hospital stay for CHF exacerbation and fluid overload. Pt was seen by cardiology yesterday and was advised to go to ER for severe fluid overload but pt refused. INR lab was drawn at the apt with cardiology so pt would not also have to travel to coumadin clinic. INR received this morning or 3.4  Hold warfarin today and then change weekly dose to take 1/2 tablet daily.Recheck in 3 weeks.   Contacted pt and his wife, Joycelyn Schmid, and advised of dosing change. Pt denies any bleeding. Educated on s/s of bleeding and advised if any bleeding contact office or go directly to the ER.

## 2022-04-08 NOTE — Patient Instructions (Addendum)
Pre visit review using our clinic review tool, if applicable. No additional management support is needed unless otherwise documented below in the visit note.  Hold warfarin today and then change weekly dose to take 1/2 tablet daily.Recheck in 3 weeks.

## 2022-04-11 ENCOUNTER — Other Ambulatory Visit: Payer: Self-pay | Admitting: *Deleted

## 2022-04-11 ENCOUNTER — Encounter: Payer: Self-pay | Admitting: Internal Medicine

## 2022-04-11 ENCOUNTER — Other Ambulatory Visit: Payer: Self-pay | Admitting: Family Medicine

## 2022-04-11 ENCOUNTER — Ambulatory Visit: Payer: Medicare Other | Attending: Internal Medicine | Admitting: Internal Medicine

## 2022-04-11 VITALS — BP 86/53 | HR 98 | Wt 163.0 lb

## 2022-04-11 DIAGNOSIS — Z7901 Long term (current) use of anticoagulants: Secondary | ICD-10-CM | POA: Insufficient documentation

## 2022-04-11 DIAGNOSIS — Z79899 Other long term (current) drug therapy: Secondary | ICD-10-CM | POA: Diagnosis not present

## 2022-04-11 DIAGNOSIS — I5033 Acute on chronic diastolic (congestive) heart failure: Secondary | ICD-10-CM | POA: Diagnosis not present

## 2022-04-11 DIAGNOSIS — I2510 Atherosclerotic heart disease of native coronary artery without angina pectoris: Secondary | ICD-10-CM | POA: Diagnosis not present

## 2022-04-11 DIAGNOSIS — J449 Chronic obstructive pulmonary disease, unspecified: Secondary | ICD-10-CM | POA: Insufficient documentation

## 2022-04-11 DIAGNOSIS — I130 Hypertensive heart and chronic kidney disease with heart failure and stage 1 through stage 4 chronic kidney disease, or unspecified chronic kidney disease: Secondary | ICD-10-CM | POA: Diagnosis not present

## 2022-04-11 DIAGNOSIS — I5032 Chronic diastolic (congestive) heart failure: Secondary | ICD-10-CM

## 2022-04-11 DIAGNOSIS — Z86718 Personal history of other venous thrombosis and embolism: Secondary | ICD-10-CM | POA: Diagnosis not present

## 2022-04-11 DIAGNOSIS — I493 Ventricular premature depolarization: Secondary | ICD-10-CM

## 2022-04-11 DIAGNOSIS — I701 Atherosclerosis of renal artery: Secondary | ICD-10-CM | POA: Diagnosis not present

## 2022-04-11 DIAGNOSIS — Z8679 Personal history of other diseases of the circulatory system: Secondary | ICD-10-CM | POA: Diagnosis not present

## 2022-04-11 DIAGNOSIS — Z8673 Personal history of transient ischemic attack (TIA), and cerebral infarction without residual deficits: Secondary | ICD-10-CM | POA: Insufficient documentation

## 2022-04-11 DIAGNOSIS — Z951 Presence of aortocoronary bypass graft: Secondary | ICD-10-CM | POA: Diagnosis not present

## 2022-04-11 DIAGNOSIS — I739 Peripheral vascular disease, unspecified: Secondary | ICD-10-CM | POA: Diagnosis not present

## 2022-04-11 DIAGNOSIS — I480 Paroxysmal atrial fibrillation: Secondary | ICD-10-CM

## 2022-04-11 DIAGNOSIS — R531 Weakness: Secondary | ICD-10-CM | POA: Diagnosis not present

## 2022-04-11 DIAGNOSIS — N1831 Chronic kidney disease, stage 3a: Secondary | ICD-10-CM | POA: Diagnosis not present

## 2022-04-11 DIAGNOSIS — Z87891 Personal history of nicotine dependence: Secondary | ICD-10-CM | POA: Diagnosis not present

## 2022-04-11 DIAGNOSIS — I350 Nonrheumatic aortic (valve) stenosis: Secondary | ICD-10-CM

## 2022-04-11 DIAGNOSIS — I255 Ischemic cardiomyopathy: Secondary | ICD-10-CM

## 2022-04-11 LAB — CBC
HCT: 44.1 % (ref 39.0–52.0)
Hemoglobin: 13.7 g/dL (ref 13.0–17.0)
MCH: 28.3 pg (ref 26.0–34.0)
MCHC: 31.1 g/dL (ref 30.0–36.0)
MCV: 91.1 fL (ref 80.0–100.0)
Platelets: 243 10*3/uL (ref 150–400)
RBC: 4.84 MIL/uL (ref 4.22–5.81)
RDW: 17.1 % — ABNORMAL HIGH (ref 11.5–15.5)
WBC: 8.4 10*3/uL (ref 4.0–10.5)
nRBC: 0 % (ref 0.0–0.2)

## 2022-04-11 LAB — BASIC METABOLIC PANEL
Anion gap: 12 (ref 5–15)
BUN: 44 mg/dL — ABNORMAL HIGH (ref 8–23)
CO2: 33 mmol/L — ABNORMAL HIGH (ref 22–32)
Calcium: 8.9 mg/dL (ref 8.9–10.3)
Chloride: 92 mmol/L — ABNORMAL LOW (ref 98–111)
Creatinine, Ser: 2.23 mg/dL — ABNORMAL HIGH (ref 0.61–1.24)
GFR, Estimated: 29 mL/min — ABNORMAL LOW (ref >60)
Glucose, Bld: 103 mg/dL — ABNORMAL HIGH (ref 70–99)
Potassium: 3.6 mmol/L (ref 3.5–5.1)
Sodium: 137 mmol/L (ref 135–145)

## 2022-04-11 LAB — PROTIME-INR
INR: 1.7 — ABNORMAL HIGH (ref 0.8–1.2)
Prothrombin Time: 19.5 seconds — ABNORMAL HIGH (ref 11.4–15.2)

## 2022-04-11 LAB — BRAIN NATRIURETIC PEPTIDE: B Natriuretic Peptide: 423.8 pg/mL — ABNORMAL HIGH (ref 0.0–100.0)

## 2022-04-11 MED ORDER — MEXILETINE HCL 200 MG PO CAPS: 200.0000 mg | ORAL_CAPSULE | Freq: Two times a day (BID) | ORAL | 3 refills | Status: DC

## 2022-04-11 MED ORDER — FUROSEMIDE 40 MG PO TABS: 40.0000 mg | ORAL_TABLET | Freq: Every day | ORAL | 0 refills | Status: DC

## 2022-04-11 MED ORDER — METOPROLOL SUCCINATE ER 25 MG PO TB24: 25.0000 mg | ORAL_TABLET | Freq: Every day | ORAL | 3 refills | Status: DC

## 2022-04-11 NOTE — Patient Instructions (Signed)
Medication Changes:  Take Furosemide 40 mg Daily  STOP Carvedilol  START Metoprolol XL 25 mg Daily  START Mexilitene 200 mg Twice daily   Lab Work:  Labs done today, your results will be available in MyChart, we will contact you for abnormal readings.   Testing/Procedures:  Your physician has requested that you have a cardiac catheterization. Cardiac catheterization is used to diagnose and/or treat various heart conditions. Doctors may recommend this procedure for a number of different reasons. The most common reason is to evaluate chest pain. Chest pain can be a symptom of coronary artery disease (CAD), and cardiac catheterization can show whether plaque is narrowing or blocking your heart's arteries. This procedure is also used to evaluate the valves, as well as measure the blood flow and oxygen levels in different parts of your heart. For further information please visit [email or web address]. Please follow instruction sheet, as given. SEE INSTRUCTIONS BELOW  Referrals:  You have been referred to Pulmonary Rehab at [facility], they will call you to schedule  Special Instructions // Education:  Do the following things EVERYDAY: Weigh yourself in the morning before breakfast. Write it down and keep it in a log. Take your medicines as prescribed Eat low salt foods--Limit salt (sodium) to 2000 mg per day.  Stay as active as you can everyday Limit all fluids for the day to less than 2 liters  Follow-Up in: 3 weeks   If you have any questions or concerns before your next appointment please send [place] a message through mychart or call our office at [phone] Monday-Friday 8 am-5 pm.   If you have an urgent need after hours on the weekend please call your Primary Cardiologist or the [facility] in [place] at [phone].     [facility] [place]  [id number] Dept: [place]  04/11/2022  You are scheduled for a Cardiac Catheterization on Friday, December 22 with Dr. Glori Bickers.  1. Please arrive at the [facility] (Main Entrance A) at [facility]: [address] [place], [place] [address] at 7:00 AM  Free valet parking service is available.   Special note: Every effort is made to have your procedure done on time. Please understand that emergencies sometimes delay scheduled procedures.  2. Diet: Do not eat solid foods after midnight.  The patient may have clear liquids until 5am upon the day of the procedure.  3. Labs: DONE TODAY  4. Medication instructions in preparation for your procedure:   **WE WILL CALL YOU LATER REGARDING HOW LONG TO HOLD YOUR COUMADIN**   FRIDAY 12/22 AM DO NOT TAKE FUROSEMIDE   On the morning of your procedure, take any morning medicines NOT listed above.  You may use sips of water.  5. Plan for one night stay--bring personal belongings. 6. Bring a current list of your medications and current insurance cards. 7. You MUST have a responsible person to drive you home. 8. Someone MUST be with you the first 24 hours after you arrive home or your discharge will be delayed. 9. Please wear clothes that are easy to get on and off and wear slip-on shoes.  Thank you for allowing [place] to care for you!   -- [facility]

## 2022-04-11 NOTE — Progress Notes (Signed)
ADVANCED HF CLINIC NOTE  Referring Physician: Darylene Price, NP  Primary Care: Owens Loffler, MD Primary Cardiologist: [name], MD   HPI:  [name] is a 81 y/o male with a history of AAA, carotid disease, CAD s/p CABG, CKD 3a, renal artery stenosis, COPD, intraventricular hemorrhage, moderate AS, PAF, PAD, DVT, subclavian artery stenosis, tobacco use and chronic heart failure.   Underwent CABG x 3 in 2013 with placement of a LIMA to LAD, vein graft to the RPDA, and vein graft to the obtuse marginal.  He suffered an intracranial hemorrhage in 2014 which was suspected to be due to malignant hypertension and dual antiplatelet therapy.  Shortly thereafter, he was diagnosed with extensive right-sided DVT and underwent IVC filter in the setting of contraindication for anticoagulation.    In 2018, he had extensive bilateral occlusive DVTs, which required catheter-based therapy and he has been on anticoagulation since then with warfarin.    EF was previously as low as 35 to 40% prior to his bypass surgery with subsequent improvement to 55 to 60% by echo in 2018.   Cath 11/22 and showed 3 out of 3 patent grafts with known occlusive left main and RCA disease.  Aortic stenosis was moderate by gradient 15 mmHg and valve area 1.1 cm.  Right heart catheterization was notable for elevated filling pressures and Lasix 20 mg daily was resumed.  Due to worsening claudication at follow-up, he underwent repeat ABIs which were abnormal at 0.61 on the right and 0.56 on the left.  Lower extremity angiogram was performed January 2023 which showed patent left renal artery stent with moderate right renal artery stenosis, patent bilateral iliac kissing stents extending into the distal aorta with mild restenosis, subtotal occlusion of the inferior branch of the right profunda with diffuse calcified SFA disease and collaterals from the profunda and three-vessel runoff below the knee.     Admitted 10/23 after a  fall found to have right rib fracture, pneumothorax and subcutaneous emphysema.  He initially presented to [facility] but was transferred to [facility] to the trauma service.  He was initially quiring 15 L of O2.  CT showed contusion workup was unremarkable, they recommended conservative management as the patient slowly started to require less and less oxygen.  Hospitalization the patient had A-fib with RVR, rate controlled with Coreg.     Admitted 03/23/22 due to acute on chronic diastolic HF w/ significant volume overload. Cardiology consult obtained. Initially given IV lasix with transition to oral diuretics. Discharged after 3 days. Weight 179 on 12/1    Echo report from 03/24/22 showed an EF of 50-55% along with mild/moderate [name], moderate AS with severe calcification of aortic valve.   I saw him last week and had massive fluid overload.Started on Furoscix x 4 days with 2 days of metolazone as well. He is down 15-20 pounds. Feels tired. BP low but no orthostasis. Breathing much better. Edema resolved. No CP. Still very weak.     Past Medical History:  Diagnosis Date   ([place]) heart failure with improved ejection fraction ([place])    a. 06/2011 Echo: EF 35-40%; b. 06/2012 Echo: EF 50%; c. 12/2016 Echo: EF 55-60%, no rwma, GRI DD, mild AS/[name], nl RV fxn, nl RVSP; d. 02/2021 Echo: EF 55%, GrII DD. Mild [name]. Mild to mod TR. Sev AS by VTI.   AAA (abdominal aortic aneurysm) ([place])    a. 05/2020 Abd u/s: 3.6cm.   Aortic calcification (HCC) 05/01/2013   Bilateral renal artery  stenosis ([place])    a. 06/2013 Angio/PTA: LRA: 95 (5x18 Herculink stent), RRA 60ost; b. 04/2021 Angio: mod RRA stenosis w/ patent LRA stent.   Carotid arterial disease ([place])    a. 05/2020 Carotid U/S: RICA 6-81%, LICA 15-72%   CHF (congestive heart failure) (HCC)    Chronic kidney disease    COPD (chronic obstructive pulmonary disease) (HCC)    Coronary artery disease    a. 2013 s/p CABG x 3 (LIMA->LAD, VG->OM, VG->RPDA; b. 06/2012 MV: no  ischemia; c. 02/2021 Cath: LM 100, RCA 90ost, 122m free LIMA->LAD nl, VG->OM2 nl, VG->RPDA nl. Mod AS (mean grad 176mg, AVA 1.1cm^2). RHC w/ elev filling pressures.   History of prior cigarette smoking 04/11/2008   Qualifier: Diagnosis of  By: BeDiona BrownerD, Amy     Hyperlipidemia    Hypertension    Hypothyroidism    Int[place]ricular hemorrhage ([place]/2014   Ischemic cardiomyopathy    a. 06/2011 Echo: EF 35-40%; b. 06/2012 Echo: EF 50%; c. 12/2016 Echo: EF 55-60%, no rwma, GRI DD; d. 02/2021 Echo: Ef 55%, GrII DD.   Moderate aortic stenosis    a. 02/2021 Echo: EF 55%, GrII DD. Sev Ca2+ of AoV. Sev AS w/ AVA by VTI 0.79cm^2; b. 02/2021 Cath: Mod AS w/ mean grad 1584m. AVA 1.1cm^2.   PAF (paroxysmal atrial fibrillation) ([place]  Peripheral arterial disease ([place]  a. Previous left lower extremity stenting by Dr. SanJamal Collinb. 12/2012 s/p bilat ostial common iliac stenting; c. 04/2021 Angio: Patent LRA stent, mod RRA stenosis. Small AAA. Sev plaque RSFA 2/ subtl occl of inf branch of profunda. Diff Ca2+ SFA dzs w/ collats from profunda and 3V runoff. Diffuse LSFA dzs w/ collats from profunda. Subtl PT dzs. 3V runoff-->Med Rx.   Right leg DVT ([place]/2014   Subclavian artery stenosis, left ([place]   Status post stenting of the ostium and self-expanding stent placement to the left axillary artery   Venous insufficiency     Current Outpatient Medications  Medication Sig Dispense Refill   albuterol (VENTOLIN HFA) 108 (90 Base) MCG/ACT inhaler Inhale 2 puffs into the lungs every 6 (six) hours as needed for wheezing or shortness of breath. 18 g 0   atorvastatin (LIPITOR) 20 MG tablet TAKE 1 TABLET BY MOUTH EVERY DAY 30 tablet 5   augmented betamethasone dipropionate (DIPROLENE-AF) 0.05 % cream APPLY TO AFFECTED AREA TWICE A DAY     carvedilol (COREG) 6.25 MG tablet TAKE 1 TABLET BY MOUTH TWICE A DAY WITH MEALS 180 tablet 1   cilostazol (PLETAL) 50 MG tablet TAKE 1 TABLET BY MOUTH TWICE A  DAY 180 tablet 0   Furosemide (FUROSCIX) 80 MG/10ML CTKT Inject 80 mg into the skin as directed.     furosemide (LASIX) 40 MG tablet Take 1 tablet (40 mg total) by mouth daily. Take extra 20-40 mg (0.5-1 tablet for total daily dose 60-80 mg) orally as needed for weight gain of 3 pounds overnight or 5 pounds in one week, lower leg swelling with shortness of breath 45 tablet 0   levothyroxine (SYNTHROID) 150 MCG tablet TAKE ONE TABLET BY MOUTH EVERY DAY BEFORE BREAKFAST 90 tablet 1   potassium chloride SA (KLOR-CON M) 20 MEQ tablet Take 2 tablets (40 mEq total) by mouth daily. 10 tablet 0   warfarin (COUMADIN) 5 MG tablet Take 2.5 mg by mouth daily.     No current facility-administered medications for this visit.    Allergies  Allergen Reactions  Plavix [Clopidogrel Bisulfate]     Brain hemorrhage prev while on plavix and aspirin      Social History   Socioeconomic History   Marital status: Married    Spouse name: Not on file   Number of children: 2   Years of education: Not on file   Highest education level: Not on file  Occupational History   Occupation: Retired    Fish farm manager: RETIRED    Comment: [employer]  Tobacco Use   Smoking status: Former    Packs/day: 1.00    Years: 60.00    Total pack years: 60.00    Types: Cigarettes    Quit date: 12/25/2010    Years since quitting: 11.3   Smokeless tobacco: Never  Vaping Use   Vaping Use: Never used  Substance and Sexual Activity   Alcohol use: No   Drug use: No   Sexual activity: Not Currently  Other Topics Concern   Not on file  Social History Narrative   Exercising 3 times a week.    Moderate diet control.    O living will, no HCPOA.   Social Determinants of Health   Financial Resource Strain: Low Risk  (07/05/2021)   Overall Financial Resource Strain (CARDIA)    Difficulty of Paying Living Expenses: Not very hard  Food Insecurity: No Food Insecurity (03/24/2022)   Hunger Vital Sign    Worried About Running  Out of Food in the Last Year: Never true    Ran Out of Food in the Last Year: Never true  Transportation Needs: No Transportation Needs (03/24/2022)   PRAPARE - Hydrologist (Medical): No    Lack of Transportation (Non-Medical): No  Physical Activity: Insufficiently Active (07/05/2021)   Exercise Vital Sign    Days of Exercise per Week: 7 days    Minutes of Exercise per Session: 20 min  Stress: No Stress Concern Present (07/05/2021)   [place]    Feeling of Stress : Not at all  Social Connections: Moderately Isolated (07/05/2021)   Social Connection and Isolation Panel [NHANES]    Frequency of Communication with Friends and Family: More than three times a week    Frequency of Social Gatherings with Friends and Family: More than three times a week    Attends Religious Services: Never    Marine scientist or Organizations: No    Attends Archivist Meetings: Never    Marital Status: Married  Human resources officer Violence: Not At Risk (03/24/2022)   Humiliation, Afraid, Rape, and Kick questionnaire    Fear of Current or Ex-Partner: No    Emotionally Abused: No    Physically Abused: No    Sexually Abused: No      Family History  Problem Relation Age of Onset   Alcohol abuse Father    Cirrhosis Father    Hypothyroidism Sister     Vitals:   04/11/22 1016 04/11/22 1021  BP: (!) 90/58 (!) 86/53  Pulse: 98   SpO2: 96%   Weight: 163 lb (73.9 kg)     PHYSICAL EXAM: General:  Fatigued appearing. No resp difficulty HEENT: normal Neck: supple. no JVD. Carotids 2+ bilat; no bruits. No lymphadenopathy or thryomegaly appreciated. Cor: PMI nondisplaced. Regular rate & rhythm. I do not hear AS murmur  Lungs: crackles R base Abdomen: soft, nontender, nondistended. No hepatosplenomegaly. No bruits or masses. Good bowel sounds. Extremities: no cyanosis, rash, edema. Mild  clubbing Neuro: alert & orientedx3, cranial nerves grossly intact. moves all 4 extremities w/o difficulty. Affect pleasant   ASSESSMENT & PLAN:    1. Acute on chronic diastolic heart failure -Echo 02/2022 showed LVEF 55 to 60%, no wall motion abnormalities, normal RV systolic function, mild [name] Moderate AS - Volume status much better after Furoscix. Now euvolemic but remains NYHA III-IIIB - Unclear currently how much of his limitation related to HF vs AS or non-cardiac issues - On echo AS appears severe but gradients moderate -> Will plan R/L cath this week (pressures only) to further assess AS - Refer Cardiac Rehab - Continue lasix 40 daily. Check labs today. - I remain concerned about his trajectory - Consider Jardiance down the road - BP low. Switch carvedilol to Toprol 25  2. Paroxysmal A-fib - In NSR - Continue warfarin   3. PVCs -Recent ZIO showed 17% PVC burden -Start mexilitene 200 bid for PVC suppression  4. Moderate aortic stenosis - AS moderate by echo - Plan as above - If AS severe access for TAVR may be an issue   5. CKD stage 3a - monitor Scr with diuresis - Baseline SCr 1.2-1.4 - Labs today  6. CAD s/p CABG and multiple PCIs - Grafts patent on cath 2022 - No current angina - Followed by Dr. Fletcher Anon  Total time spent 40 minutes. Over half that time spent discussing above.     Glori Bickers, MD  10:55 AM

## 2022-04-12 ENCOUNTER — Ambulatory Visit: Payer: Medicare Other | Admitting: Cardiovascular Disease

## 2022-04-14 ENCOUNTER — Other Ambulatory Visit (HOSPITAL_BASED_OUTPATIENT_CLINIC_OR_DEPARTMENT_OTHER): Payer: Self-pay | Admitting: Osteopathic Medicine

## 2022-04-15 ENCOUNTER — Ambulatory Visit (HOSPITAL_COMMUNITY): Admission: RE | Admit: 2022-04-15 | Payer: Medicare Other | Source: Home / Self Care | Admitting: Internal Medicine

## 2022-04-15 ENCOUNTER — Encounter (HOSPITAL_COMMUNITY): Admission: RE | Payer: Self-pay | Source: Home / Self Care

## 2022-04-15 SURGERY — RIGHT/LEFT HEART CATH AND CORONARY/GRAFT ANGIOGRAPHY
Anesthesia: LOCAL

## 2022-04-26 ENCOUNTER — Telehealth (HOSPITAL_COMMUNITY): Payer: Self-pay

## 2022-04-26 ENCOUNTER — Telehealth: Payer: Self-pay | Admitting: Internal Medicine

## 2022-04-26 NOTE — Telephone Encounter (Signed)
Patient's wife called and stated he does not want the heart cath to be scheduled at [facility]. She has scheduled a consult appointment with Dr. Haroldine Laws in [place] to go over options. In review of notes it appears this has already been canceled. I called and spoke with wife and informed her.

## 2022-04-26 NOTE — Telephone Encounter (Signed)
I spoke with patients spouse who agreed to schedule appt with Dr. Haroldine Laws to discuss his options as patient does not want to schedule a heart cath and they were going to reschedule his appointment with Dr. Fletcher Anon as patient said it is too much at one time and he is over everything and needs his options on how to move forward as he feels he just is not getting any better.   Ali Mclaurin, NT

## 2022-04-28 ENCOUNTER — Ambulatory Visit (INDEPENDENT_AMBULATORY_CARE_PROVIDER_SITE_OTHER): Payer: Medicare Other

## 2022-04-28 DIAGNOSIS — Z7901 Long term (current) use of anticoagulants: Secondary | ICD-10-CM | POA: Diagnosis not present

## 2022-04-28 LAB — POCT INR: INR: 1.9 — AB (ref 2.0–3.0)

## 2022-04-28 NOTE — Progress Notes (Signed)
Pt was scheduled recently for a heat cath to assess a heart valve. Pt did not want to proceed with the cath so he is postponing it. His INR was checked at that time and it was 1.7. Due to that subtherapeutic reading and today's, there will be a change in his weekly dosing. Pt's wife also reports his diet has changed a lot and he has been eating a lot more vegetables and fruits.   Increase dose today to take 1 tablet and then change weekly dose to take 1/2 tablet daily except take 1 tablet on Mondays. Recheck in 3 weeks.

## 2022-04-28 NOTE — Patient Instructions (Addendum)
Pre visit review using our clinic review tool, if applicable. No additional management support is needed unless otherwise documented below in the visit note.  Increase dose today to take 1 tablet and then change weekly dose to take 1/2 tablet daily except take 1 tablet on Mondays. Recheck in 3 weeks.  

## 2022-05-01 NOTE — Progress Notes (Signed)
ADVANCED HF CLINIC NOTE  Referring Physician: Darylene Price, NP  Primary Care: Owens Loffler, MD Primary Cardiologist: [name], MD   HPI:  Mr Patchell is a 82 y/o male with a history of AAA, carotid disease, CAD s/p CABG, CKD 3a, renal artery stenosis, COPD, intraventricular hemorrhage, moderate AS, PAF, PAD, DVT, subclavian artery stenosis, tobacco use and chronic heart failure.   Underwent CABG x 3 in 2013 with placement of a LIMA to LAD, vein graft to the RPDA, and vein graft to the obtuse marginal.  He suffered an intracranial hemorrhage in 2014 which was suspected to be due to malignant hypertension and dual antiplatelet therapy.  Shortly thereafter, he was diagnosed with extensive right-sided DVT and underwent IVC filter in the setting of contraindication for anticoagulation.    In 2018, he had extensive bilateral occlusive DVTs, which required catheter-based therapy and he has been on anticoagulation since then with warfarin.    EF was previously as low as 35 to 40% prior to his bypass surgery with subsequent improvement to 55 to 60% by echo in 2018.   Cath 11/22 and showed 3 out of 3 patent grafts with known occlusive left main and RCA disease.  Aortic stenosis was moderate by gradient 15 mmHg and valve area 1.1 cm.  Right heart catheterization was notable for elevated filling pressures and Lasix 20 mg daily was resumed.  Due to worsening claudication at follow-up, he underwent repeat ABIs which were abnormal at 0.61 on the right and 0.56 on the left.  Lower extremity angiogram was performed January 2023 which showed patent left renal artery stent with moderate right renal artery stenosis, patent bilateral iliac kissing stents extending into the distal aorta with mild restenosis, subtotal occlusion of the inferior branch of the right profunda with diffuse calcified SFA disease and collaterals from the profunda and three-vessel runoff below the knee.     Admitted 10/23 after a  fall found to have right rib fracture, pneumothorax and subcutaneous emphysema.  He initially presented to [facility] but was transferred to [facility] to the trauma service.  He was initially quiring 15 L of O2.  CT showed contusion workup was unremarkable, they recommended conservative management as the patient slowly started to require less and less oxygen.  Hospitalization the patient had A-fib with RVR, rate controlled with Coreg.     Admitted 03/23/22 due to acute on chronic diastolic HF w/ significant volume overload. Cardiology consult obtained. Initially given IV lasix with transition to oral diuretics. Discharged after 3 days. Weight 179 on 12/1    Echo report from 03/24/22 showed an EF of 50-55% along with mild/moderate MR, moderate AS with severe calcification of aortic valve.   I saw him in 12/23 and had massive fluid overload.Started on Furoscix x 4 days with 2 days of metolazone as well. He diuresed 15-20 pounds. BP low but no orthostasis. Edema resolved. We scheduled R/L cath (pressures only) to assess hemodynamics and AS but he cancelled.   He for f/u with his wife. Continues to feel weak. Says he is peeing every hour while he is up and then every 30 minutes when lying down. Takes lasix 40 daily. Only pees a small amount. Very fatigued. Struggles with ADLs due to fatigue and leg weakness/claudication. Weight stable 161. No CP. Says HR had been up for past few days on BP cuff.     Past Medical History:  Diagnosis Date   (HFimpEF) heart failure with improved ejection fraction ([place])  a. 06/2011 Echo: EF 35-40%; b. 06/2012 Echo: EF 50%; c. 12/2016 Echo: EF 55-60%, no rwma, GRI DD, mild AS/MR, nl RV fxn, nl RVSP; d. 02/2021 Echo: EF 55%, GrII DD. Mild MR. Mild to mod TR. Sev AS by VTI.   AAA (abdominal aortic aneurysm) ([place])    a. 05/2020 Abd u/s: 3.6cm.   Aortic calcification (HCC) 05/01/2013   Bilateral renal artery stenosis ([place])    a. 06/2013 Angio/PTA: LRA: 95 (5x18 Herculink stent),  RRA 60ost; b. 04/2021 Angio: mod RRA stenosis w/ patent LRA stent.   Carotid arterial disease ([place])    a. 05/2020 Carotid U/S: RICA 9-37%, LICA 16-96%   CHF (congestive heart failure) (HCC)    Chronic kidney disease    COPD (chronic obstructive pulmonary disease) (HCC)    Coronary artery disease    a. 2013 s/p CABG x 3 (LIMA->LAD, VG->OM, VG->RPDA; b. 06/2012 MV: no ischemia; c. 02/2021 Cath: LM 100, RCA 90ost, 129m free LIMA->LAD nl, VG->OM2 nl, VG->RPDA nl. Mod AS (mean grad 161mg, AVA 1.1cm^2). RHC w/ elev filling pressures.   History of prior cigarette smoking 04/11/2008   Qualifier: Diagnosis of  By: BeDiona BrownerD, Amy     Hyperlipidemia    Hypertension    Hypothyroidism    Intraventricular hemorrhage (HCLagrange03/20/2014   Ischemic cardiomyopathy    a. 06/2011 Echo: EF 35-40%; b. 06/2012 Echo: EF 50%; c. 12/2016 Echo: EF 55-60%, no rwma, GRI DD; d. 02/2021 Echo: Ef 55%, GrII DD.   Moderate aortic stenosis    a. 02/2021 Echo: EF 55%, GrII DD. Sev Ca2+ of AoV. Sev AS w/ AVA by VTI 0.79cm^2; b. 02/2021 Cath: Mod AS w/ mean grad 1560m. AVA 1.1cm^2.   PAF (paroxysmal atrial fibrillation) (HCCC[place]  Peripheral arterial disease (HCCFlordell Hills  a. Previous left lower extremity stenting by Dr. SanJamal Collinb. 12/2012 s/p bilat ostial common iliac stenting; c. 04/2021 Angio: Patent LRA stent, mod RRA stenosis. Small AAA. Sev plaque RSFA 2/ subtl occl of inf branch of profunda. Diff Ca2+ SFA dzs w/ collats from profunda and 3V runoff. Diffuse LSFA dzs w/ collats from profunda. Subtl PT dzs. 3V runoff-->Med Rx.   Right leg DVT (HCCAlbright4/21/2014   Subclavian artery stenosis, left (HCCElk[place]2/2014   Status post stenting of the ostium and self-expanding stent placement to the left axillary artery   Venous insufficiency     Current Outpatient Medications  Medication Sig Dispense Refill   albuterol (VENTOLIN HFA) 108 (90 Base) MCG/ACT inhaler Inhale 2 puffs into the lungs every 6 (six) hours as needed for wheezing or shortness  of breath. 18 g 0   atorvastatin (LIPITOR) 20 MG tablet TAKE 1 TABLET BY MOUTH EVERY DAY 30 tablet 5   augmented betamethasone dipropionate (DIPROLENE-AF) 0.05 % cream APPLY TO AFFECTED AREA TWICE A DAY     cilostazol (PLETAL) 50 MG tablet TAKE 1 TABLET BY MOUTH TWICE A DAY 180 tablet 0   furosemide (LASIX) 40 MG tablet Take 1 tablet (40 mg total) by mouth daily. 45 tablet 0   levothyroxine (SYNTHROID) 150 MCG tablet TAKE ONE TABLET BY MOUTH EVERY DAY BEFORE BREAKFAST 90 tablet 0   metoprolol succinate (TOPROL XL) 25 MG 24 hr tablet Take 1 tablet (25 mg total) by mouth daily. 30 tablet 3   mexiletine (MEXITIL) 200 MG capsule Take 1 capsule (200 mg total) by mouth 2 (two) times daily. 60 capsule 3   warfarin (COUMADIN) 5 MG tablet Take 2.5 mg by  mouth daily. 1/2 tab daily except 1 tab on mon,wed,frid     No current facility-administered medications for this visit.    Allergies  Allergen Reactions   Plavix [Clopidogrel Bisulfate]     Brain hemorrhage prev while on plavix and aspirin      Social History   Socioeconomic History   Marital status: Married    Spouse name: Not on file   Number of children: 2   Years of education: Not on file   Highest education level: Not on file  Occupational History   Occupation: Retired    Fish farm manager: RETIRED    Comment: [employer]  Tobacco Use   Smoking status: Former    Packs/day: 1.00    Years: 60.00    Total pack years: 60.00    Types: Cigarettes    Quit date: 12/25/2010    Years since quitting: 11.3   Smokeless tobacco: Never  Vaping Use   Vaping Use: Never used  Substance and Sexual Activity   Alcohol use: No   Drug use: No   Sexual activity: Not Currently  Other Topics Concern   Not on file  Social History Narrative   Exercising 3 times a week.    Moderate diet control.    O living will, no HCPOA.   Social Determinants of Health   Financial Resource Strain: Low Risk  (07/05/2021)   Overall Financial Resource Strain  (CARDIA)    Difficulty of Paying Living Expenses: Not very hard  Food Insecurity: No Food Insecurity (03/24/2022)   Hunger Vital Sign    Worried About Running Out of Food in the Last Year: Never true    Ran Out of Food in the Last Year: Never true  Transportation Needs: No Transportation Needs (03/24/2022)   PRAPARE - Hydrologist (Medical): No    Lack of Transportation (Non-Medical): No  Physical Activity: Insufficiently Active (07/05/2021)   Exercise Vital Sign    Days of Exercise per Week: 7 days    Minutes of Exercise per Session: 20 min  Stress: No Stress Concern Present (07/05/2021)   [place]    Feeling of Stress : Not at all  Social Connections: Moderately Isolated (07/05/2021)   Social Connection and Isolation Panel [NHANES]    Frequency of Communication with Friends and Family: More than three times a week    Frequency of Social Gatherings with Friends and Family: More than three times a week    Attends Religious Services: Never    Marine scientist or Organizations: No    Attends Archivist Meetings: Never    Marital Status: Married  Human resources officer Violence: Not At Risk (03/24/2022)   Humiliation, Afraid, Rape, and Kick questionnaire    Fear of Current or Ex-Partner: No    Emotionally Abused: No    Physically Abused: No    Sexually Abused: No      Family History  Problem Relation Age of Onset   Alcohol abuse Father    Cirrhosis Father    Hypothyroidism Sister     Vitals:   05/02/22 1359  BP: (!) 100/58  Pulse: (!) 108  SpO2: 98%  Weight: 162 lb (73.5 kg)   Wt Readings from Last 3 Encounters:  05/02/22 162 lb (73.5 kg)  04/11/22 163 lb (73.9 kg)  04/07/22 178 lb 2 oz (80.8 kg)     PHYSICAL EXAM: General:  Fatigued appearing. No resp difficulty HEENT:  normal Neck: supple. no JVD. Carotids 2+ bilat; no bruits. No lymphadenopathy or thryomegaly  appreciated. Cor: PMI nondisplaced. Irregular tachy No rubs, gallops or murmurs. Lungs: crackles R base  Abdomen: soft, nontender, nondistended. No hepatosplenomegaly. No bruits or masses. Good bowel sounds. Extremities: no cyanosis, clubbing, rash, tr edema Neuro: alert & orientedx3, cranial nerves grossly intact. moves all 4 extremities w/o difficulty. Affect pleasant  ECG: AFib 129 Personally reviewed  ASSESSMENT & PLAN:    1. Chronic diastolic heart failure -  02/2022 showed LVEF 55 to 60%, no wall motion abnormalities, normal RV systolic function, mild MR Moderate AS - Volume status much better after Furoscix. Now euvolemic but remains NYHA IIIB - Unclear currently how much of his limitation related to HF vs AS or non-cardiac issues - On echo AS appears severe but gradients moderate -> Had planned R/L cath this week (pressures only) to further assess AS and outputs but will defer for now with recurrent AF - Referred to Cardiac Rehab but hasn't called them back yet.  - Continue lasix 40 daily. Check labs today. - I remain concerned about his trajectory and we discussed openly that he may be running out of reserve but he remains interested in pursuing aggressive options for now - Consider Jardiance down the road - BP low.   2. Paroxysmal A-fib - In Af with RVR - Start amio 200 bid - Plan DC-CV    3. PVCs -Recent ZIO showed 17% PVC burden -Started on mexilitene 200 bid in 12/23 for PVC suppression.  - PVCs now suppressed but having AF - Stop mexilitene. Start amio   4. Moderate aortic stenosis - AS moderate by echo gradients but visually severe.  - Plan R/L cath when back in NSR - If AS severe access for TAVR may be an issue   5. CKD stage 3a - monitor Scr with diuresis - Baseline SCr 1.2-1.4 - Labs today  6. CAD s/p CABG and multiple PCIs - Grafts patent on cath 2022 - No current angina - Followed by Dr. Fletcher Anon  7. BPH with urinary retention - start flomax -  hopefully this will help him sleep better at night if he can empty more completely and not get up as much to urinate  Total time spent 45 minutes. Over half that time spent discussing above.    Glori Bickers, MD  2:13 PM

## 2022-05-01 NOTE — H&P (View-Only) (Signed)
ADVANCED HF CLINIC NOTE  Referring Physician: Darylene Price, NP  Primary Care: Owens Loffler, MD Primary Cardiologist: [name], MD   HPI:  Mr Prosser is a 82 y/o male with a history of AAA, carotid disease, CAD s/p CABG, CKD 3a, renal artery stenosis, COPD, intraventricular hemorrhage, moderate AS, PAF, PAD, DVT, subclavian artery stenosis, tobacco use and chronic heart failure.   Underwent CABG x 3 in 2013 with placement of a LIMA to LAD, vein graft to the RPDA, and vein graft to the obtuse marginal.  He suffered an intracranial hemorrhage in 2014 which was suspected to be due to malignant hypertension and dual antiplatelet therapy.  Shortly thereafter, he was diagnosed with extensive right-sided DVT and underwent IVC filter in the setting of contraindication for anticoagulation.    In 2018, he had extensive bilateral occlusive DVTs, which required catheter-based therapy and he has been on anticoagulation since then with warfarin.    EF was previously as low as 35 to 40% prior to his bypass surgery with subsequent improvement to 55 to 60% by echo in 2018.   Cath 11/22 and showed 3 out of 3 patent grafts with known occlusive left main and RCA disease.  Aortic stenosis was moderate by gradient 15 mmHg and valve area 1.1 cm.  Right heart catheterization was notable for elevated filling pressures and Lasix 20 mg daily was resumed.  Due to worsening claudication at follow-up, he underwent repeat ABIs which were abnormal at 0.61 on the right and 0.56 on the left.  Lower extremity angiogram was performed January 2023 which showed patent left renal artery stent with moderate right renal artery stenosis, patent bilateral iliac kissing stents extending into the distal aorta with mild restenosis, subtotal occlusion of the inferior branch of the right profunda with diffuse calcified SFA disease and collaterals from the profunda and three-vessel runoff below the knee.     Admitted 10/23 after a  fall found to have right rib fracture, pneumothorax and subcutaneous emphysema.  He initially presented to [facility] but was transferred to [facility] to the trauma service.  He was initially quiring 15 L of O2.  CT showed contusion workup was unremarkable, they recommended conservative management as the patient slowly started to require less and less oxygen.  Hospitalization the patient had A-fib with RVR, rate controlled with Coreg.     Admitted 03/23/22 due to acute on chronic diastolic HF w/ significant volume overload. Cardiology consult obtained. Initially given IV lasix with transition to oral diuretics. Discharged after 3 days. Weight 179 on 12/1    Echo report from 03/24/22 showed an EF of 50-55% along with mild/moderate MR, moderate AS with severe calcification of aortic valve.   I saw him in 12/23 and had massive fluid overload.Started on Furoscix x 4 days with 2 days of metolazone as well. He diuresed 15-20 pounds. BP low but no orthostasis. Edema resolved. We scheduled R/L cath (pressures only) to assess hemodynamics and AS but he cancelled.   He for f/u with his wife. Continues to feel weak. Says he is peeing every hour while he is up and then every 30 minutes when lying down. Takes lasix 40 daily. Only pees a small amount. Very fatigued. Struggles with ADLs due to fatigue and leg weakness/claudication. Weight stable 161. No CP. Says HR had been up for past few days on BP cuff.     Past Medical History:  Diagnosis Date   (HFimpEF) heart failure with improved ejection fraction ([place])  a. 06/2011 Echo: EF 35-40%; b. 06/2012 Echo: EF 50%; c. 12/2016 Echo: EF 55-60%, no rwma, GRI DD, mild AS/MR, nl RV fxn, nl RVSP; d. 02/2021 Echo: EF 55%, GrII DD. Mild MR. Mild to mod TR. Sev AS by VTI.   AAA (abdominal aortic aneurysm) ([place])    a. 05/2020 Abd u/s: 3.6cm.   Aortic calcification (HCC) 05/01/2013   Bilateral renal artery stenosis ([place])    a. 06/2013 Angio/PTA: LRA: 95 (5x18 Herculink stent),  RRA 60ost; b. 04/2021 Angio: mod RRA stenosis w/ patent LRA stent.   Carotid arterial disease ([place])    a. 05/2020 Carotid U/S: RICA 4-66%, LICA 59-93%   CHF (congestive heart failure) (HCC)    Chronic kidney disease    COPD (chronic obstructive pulmonary disease) (HCC)    Coronary artery disease    a. 2013 s/p CABG x 3 (LIMA->LAD, VG->OM, VG->RPDA; b. 06/2012 MV: no ischemia; c. 02/2021 Cath: LM 100, RCA 90ost, 113m free LIMA->LAD nl, VG->OM2 nl, VG->RPDA nl. Mod AS (mean grad 153mg, AVA 1.1cm^2). RHC w/ elev filling pressures.   History of prior cigarette smoking 04/11/2008   Qualifier: Diagnosis of  By: BeDiona BrownerD, Amy     Hyperlipidemia    Hypertension    Hypothyroidism    Intraventricular hemorrhage (HCMurray03/20/2014   Ischemic cardiomyopathy    a. 06/2011 Echo: EF 35-40%; b. 06/2012 Echo: EF 50%; c. 12/2016 Echo: EF 55-60%, no rwma, GRI DD; d. 02/2021 Echo: Ef 55%, GrII DD.   Moderate aortic stenosis    a. 02/2021 Echo: EF 55%, GrII DD. Sev Ca2+ of AoV. Sev AS w/ AVA by VTI 0.79cm^2; b. 02/2021 Cath: Mod AS w/ mean grad 1563m. AVA 1.1cm^2.   PAF (paroxysmal atrial fibrillation) (HCCEast Will[place]  Peripheral arterial disease ([place]CCWashten[place]  a. Previous left lower extremity stenting by Dr. SanJamal Collinb. 12/2012 s/p bilat ostial common iliac stenting; c. 04/2021 Angio: Patent LRA stent, mod RRA stenosis. Small AAA. Sev plaque RSFA 2/ subtl occl of inf branch of profunda. Diff Ca2+ SFA dzs w/ collats from profunda and 3V runoff. Diffuse LSFA dzs w/ collats from profunda. Subtl PT dzs. 3V runoff-->Med Rx.   Right leg DVT (HCCBruin4/21/2014   Subclavian artery stenosis, left (HCCMount Hebron2/2014   Status post stenting of the ostium and self-expanding stent placement to the left axillary artery   Venous insufficiency     Current Outpatient Medications  Medication Sig Dispense Refill   albuterol (VENTOLIN HFA) 108 (90 Base) MCG/ACT inhaler Inhale 2 puffs into the lungs every 6 (six) hours as needed for wheezing or shortness  of breath. 18 g 0   atorvastatin (LIPITOR) 20 MG tablet TAKE 1 TABLET BY MOUTH EVERY DAY 30 tablet 5   augmented betamethasone dipropionate (DIPROLENE-AF) 0.05 % cream APPLY TO AFFECTED AREA TWICE A DAY     cilostazol (PLETAL) 50 MG tablet TAKE 1 TABLET BY MOUTH TWICE A DAY 180 tablet 0   furosemide (LASIX) 40 MG tablet Take 1 tablet (40 mg total) by mouth daily. 45 tablet 0   levothyroxine (SYNTHROID) 150 MCG tablet TAKE ONE TABLET BY MOUTH EVERY DAY BEFORE BREAKFAST 90 tablet 0   metoprolol succinate (TOPROL XL) 25 MG 24 hr tablet Take 1 tablet (25 mg total) by mouth daily. 30 tablet 3   mexiletine (MEXITIL) 200 MG capsule Take 1 capsule (200 mg total) by mouth 2 (two) times daily. 60 capsule 3   warfarin (COUMADIN) 5 MG tablet Take 2.5 mg by  mouth daily. 1/2 tab daily except 1 tab on mon,wed,frid     No current facility-administered medications for this visit.    Allergies  Allergen Reactions   Plavix [Clopidogrel Bisulfate]     Brain hemorrhage prev while on plavix and aspirin      Social History   Socioeconomic History   Marital status: Married    Spouse name: Not on file   Number of children: 2   Years of education: Not on file   Highest education level: Not on file  Occupational History   Occupation: Retired    Fish farm manager: RETIRED    Comment: Land  Tobacco Use   Smoking status: Former    Packs/day: 1.00    Years: 60.00    Total pack years: 60.00    Types: Cigarettes    Quit date: 12/25/2010    Years since quitting: 11.3   Smokeless tobacco: Never  Vaping Use   Vaping Use: Never used  Substance and Sexual Activity   Alcohol use: No   Drug use: No   Sexual activity: Not Currently  Other Topics Concern   Not on file  Social History Narrative   Exercising 3 times a week.    Moderate diet control.    O living will, no HCPOA.   Social Determinants of Health   Financial Resource Strain: Low Risk  (07/05/2021)   Overall Financial Resource Strain  (CARDIA)    Difficulty of Paying Living Expenses: Not very hard  Food Insecurity: No Food Insecurity (03/24/2022)   Hunger Vital Sign    Worried About Running Out of Food in the Last Year: Never true    Ran Out of Food in the Last Year: Never true  Transportation Needs: No Transportation Needs (03/24/2022)   PRAPARE - Hydrologist (Medical): No    Lack of Transportation (Non-Medical): No  Physical Activity: Insufficiently Active (07/05/2021)   Exercise Vital Sign    Days of Exercise per Week: 7 days    Minutes of Exercise per Session: 20 min  Stress: No Stress Concern Present (07/05/2021)   [place]    Feeling of Stress : Not at all  Social Connections: Moderately Isolated (07/05/2021)   Social Connection and Isolation Panel [NHANES]    Frequency of Communication with Friends and Family: More than three times a week    Frequency of Social Gatherings with Friends and Family: More than three times a week    Attends Religious Services: Never    Marine scientist or Organizations: No    Attends Archivist Meetings: Never    Marital Status: Married  Human resources officer Violence: Not At Risk (03/24/2022)   Humiliation, Afraid, Rape, and Kick questionnaire    Fear of Current or Ex-Partner: No    Emotionally Abused: No    Physically Abused: No    Sexually Abused: No      Family History  Problem Relation Age of Onset   Alcohol abuse Father    Cirrhosis Father    Hypothyroidism Sister     Vitals:   05/02/22 1359  BP: (!) 100/58  Pulse: (!) 108  SpO2: 98%  Weight: 162 lb (73.5 kg)   Wt Readings from Last 3 Encounters:  05/02/22 162 lb (73.5 kg)  04/11/22 163 lb (73.9 kg)  04/07/22 178 lb 2 oz (80.8 kg)     PHYSICAL EXAM: General:  Fatigued appearing. No resp difficulty HEENT:  normal Neck: supple. no JVD. Carotids 2+ bilat; no bruits. No lymphadenopathy or thryomegaly  appreciated. Cor: PMI nondisplaced. Irregular tachy No rubs, gallops or murmurs. Lungs: crackles R base  Abdomen: soft, nontender, nondistended. No hepatosplenomegaly. No bruits or masses. Good bowel sounds. Extremities: no cyanosis, clubbing, rash, tr edema Neuro: alert & orientedx3, cranial nerves grossly intact. moves all 4 extremities w/o difficulty. Affect pleasant  ECG: AFib 129 Personally reviewed  ASSESSMENT & PLAN:    1. Chronic diastolic heart failure -  02/2022 showed LVEF 55 to 60%, no wall motion abnormalities, normal RV systolic function, mild MR Moderate AS - Volume status much better after Furoscix. Now euvolemic but remains NYHA IIIB - Unclear currently how much of his limitation related to HF vs AS or non-cardiac issues - On echo AS appears severe but gradients moderate -> Had planned R/L cath this week (pressures only) to further assess AS and outputs but will defer for now with recurrent AF - Referred to Cardiac Rehab but hasn't called them back yet.  - Continue lasix 40 daily. Check labs today. - I remain concerned about his trajectory and we discussed openly that he may be running out of reserve but he remains interested in pursuing aggressive options for now - Consider Jardiance down the road - BP low.   2. Paroxysmal A-fib - In Af with RVR - Start amio 200 bid - Plan DC-CV    3. PVCs -Recent ZIO showed 17% PVC burden -Started on mexilitene 200 bid in 12/23 for PVC suppression.  - PVCs now suppressed but having AF - Stop mexilitene. Start amio   4. Moderate aortic stenosis - AS moderate by echo gradients but visually severe.  - Plan R/L cath when back in NSR - If AS severe access for TAVR may be an issue   5. CKD stage 3a - monitor Scr with diuresis - Baseline SCr 1.2-1.4 - Labs today  6. CAD s/p CABG and multiple PCIs - Grafts patent on cath 2022 - No current angina - Followed by Dr. Fletcher Anon  7. BPH with urinary retention - start flomax -  hopefully this will help him sleep better at night if he can empty more completely and not get up as much to urinate  Total time spent 45 minutes. Over half that time spent discussing above.    Glori Bickers, MD  2:13 PM

## 2022-05-02 ENCOUNTER — Other Ambulatory Visit
Admission: RE | Admit: 2022-05-02 | Discharge: 2022-05-02 | Disposition: A | Discharge status: Home / Self Care | Payer: Medicare Other | Source: Ambulatory Visit | Attending: Internal Medicine | Admitting: Internal Medicine

## 2022-05-02 ENCOUNTER — Encounter: Payer: Self-pay | Admitting: Internal Medicine

## 2022-05-02 ENCOUNTER — Ambulatory Visit (HOSPITAL_BASED_OUTPATIENT_CLINIC_OR_DEPARTMENT_OTHER): Payer: Medicare Other | Admitting: Internal Medicine

## 2022-05-02 VITALS — BP 100/58 | HR 108 | Wt 162.0 lb

## 2022-05-02 DIAGNOSIS — N401 Enlarged prostate with lower urinary tract symptoms: Secondary | ICD-10-CM | POA: Diagnosis not present

## 2022-05-02 DIAGNOSIS — I350 Nonrheumatic aortic (valve) stenosis: Secondary | ICD-10-CM | POA: Diagnosis not present

## 2022-05-02 DIAGNOSIS — R338 Other retention of urine: Secondary | ICD-10-CM

## 2022-05-02 DIAGNOSIS — I5032 Chronic diastolic (congestive) heart failure: Secondary | ICD-10-CM | POA: Diagnosis not present

## 2022-05-02 DIAGNOSIS — I493 Ventricular premature depolarization: Secondary | ICD-10-CM | POA: Diagnosis not present

## 2022-05-02 DIAGNOSIS — N183 Chronic kidney disease, stage 3 unspecified: Secondary | ICD-10-CM

## 2022-05-02 DIAGNOSIS — I480 Paroxysmal atrial fibrillation: Secondary | ICD-10-CM | POA: Diagnosis not present

## 2022-05-02 LAB — BASIC METABOLIC PANEL
Anion gap: 9 (ref 5–15)
BUN: 20 mg/dL (ref 8–23)
CO2: 27 mmol/L (ref 22–32)
Calcium: 9.2 mg/dL (ref 8.9–10.3)
Chloride: 102 mmol/L (ref 98–111)
Creatinine, Ser: 1.64 mg/dL — ABNORMAL HIGH (ref 0.61–1.24)
GFR, Estimated: 42 mL/min — ABNORMAL LOW (ref >60)
Glucose, Bld: 93 mg/dL (ref 70–99)
Potassium: 4.7 mmol/L (ref 3.5–5.1)
Sodium: 138 mmol/L (ref 135–145)

## 2022-05-02 LAB — CBC
HCT: 45.5 % (ref 39.0–52.0)
Hemoglobin: 14 g/dL (ref 13.0–17.0)
MCH: 27.8 pg (ref 26.0–34.0)
MCHC: 30.8 g/dL (ref 30.0–36.0)
MCV: 90.3 fL (ref 80.0–100.0)
Platelets: 314 10*3/uL (ref 150–400)
RBC: 5.04 MIL/uL (ref 4.22–5.81)
RDW: 16.4 % — ABNORMAL HIGH (ref 11.5–15.5)
WBC: 6.3 10*3/uL (ref 4.0–10.5)
nRBC: 0 % (ref 0.0–0.2)

## 2022-05-02 LAB — BRAIN NATRIURETIC PEPTIDE
B Natriuretic Peptide: 408.1 pg/mL — ABNORMAL HIGH (ref 0.0–100.0)
B Natriuretic Peptide: 379.6 pg/mL — ABNORMAL HIGH (ref 0.0–100.0)

## 2022-05-02 MED ORDER — AMIODARONE HCL 200 MG PO TABS: 200.0000 mg | ORAL_TABLET | Freq: Two times a day (BID) | ORAL | 3 refills | Status: DC

## 2022-05-02 MED ORDER — TAMSULOSIN HCL 0.4 MG PO CAPS: 0.4000 mg | ORAL_CAPSULE | Freq: Every day | ORAL | 6 refills | Status: DC

## 2022-05-02 NOTE — Patient Instructions (Addendum)
Medication Changes:  STOP Mexiletine  START Amiodarone 200 mg Twice daily   START Flomax 0.4 mg Daily at supper time  Lab Work:  Labs done today, your results will be available in MyChart, we will contact you for abnormal readings.  Testing/Procedures:  Repeat EKG in 1 week  Referrals:  none  Special Instructions // Education:  Do the following things EVERYDAY: Weigh yourself in the morning before breakfast. Write it down and keep it in a log. Take your medicines as prescribed Eat low salt foods--Limit salt (sodium) to 2000 mg per day.  Stay as active as you can everyday Limit all fluids for the day to less than 2 liters   Follow-Up in: 1 week with nurse for EKG    If you have any questions or concerns before your next appointment please send [place] a message through mychart or call our office at [phone] Monday-Friday 8 am-5 pm.   If you have an urgent need after hours on the weekend please call your Primary Cardiologist or the [facility] in [place] at [phone].

## 2022-05-03 ENCOUNTER — Telehealth: Payer: Self-pay

## 2022-05-03 ENCOUNTER — Other Ambulatory Visit: Payer: Self-pay

## 2022-05-03 ENCOUNTER — Ambulatory Visit: Payer: Medicare Other | Admitting: Cardiovascular Disease

## 2022-05-03 NOTE — Telephone Encounter (Signed)
Received msg from cardiology nurse that pt has been started on amiodarone. Pt was also started on tamsulosin.   Next coumadin clinic apt pt has scheduled is for 05/19/22. Need to assess INR about 10 days after starting amiodarone due to high interaction with warfarin.   LVM to RS apt to 1/18 in the afternoon

## 2022-05-04 NOTE — Telephone Encounter (Signed)
Contacted [name], pt's wife, and advised of interaction of amiodarone and warfarin. Advised to be aware of s/s of bleeding and if pt has any contact the office of go the ER. RS apt for 1/18. She reports pt is doing well but taking it one day at a time. Advised if anything is needed or any changes to contact coumadin clinic. [name] verbalized understanding.

## 2022-05-10 ENCOUNTER — Other Ambulatory Visit
Admission: RE | Admit: 2022-05-10 | Discharge: 2022-05-10 | Disposition: A | Discharge status: Home / Self Care | Payer: Medicare Other | Source: Ambulatory Visit | Attending: Internal Medicine | Admitting: Internal Medicine

## 2022-05-10 ENCOUNTER — Ambulatory Visit (HOSPITAL_BASED_OUTPATIENT_CLINIC_OR_DEPARTMENT_OTHER): Payer: Medicare Other | Admitting: Internal Medicine

## 2022-05-10 VITALS — BP 89/70 | HR 114 | Resp 18 | Wt 162.5 lb

## 2022-05-10 DIAGNOSIS — I480 Paroxysmal atrial fibrillation: Secondary | ICD-10-CM

## 2022-05-10 DIAGNOSIS — I5032 Chronic diastolic (congestive) heart failure: Secondary | ICD-10-CM

## 2022-05-10 LAB — CBC
HCT: 44.3 % (ref 39.0–52.0)
Hemoglobin: 13.5 g/dL (ref 13.0–17.0)
MCH: 27 pg (ref 26.0–34.0)
MCHC: 30.5 g/dL (ref 30.0–36.0)
MCV: 88.6 fL (ref 80.0–100.0)
Platelets: 324 10*3/uL (ref 150–400)
RBC: 5 MIL/uL (ref 4.22–5.81)
RDW: 15.6 % — ABNORMAL HIGH (ref 11.5–15.5)
WBC: 8.7 10*3/uL (ref 4.0–10.5)
nRBC: 0 % (ref 0.0–0.2)

## 2022-05-10 LAB — PROTIME-INR
INR: 2.2 — ABNORMAL HIGH (ref 0.8–1.2)
Prothrombin Time: 23.8 seconds — ABNORMAL HIGH (ref 11.4–15.2)

## 2022-05-10 LAB — BASIC METABOLIC PANEL
Anion gap: 7 (ref 5–15)
BUN: 19 mg/dL (ref 8–23)
CO2: 29 mmol/L (ref 22–32)
Calcium: 9 mg/dL (ref 8.9–10.3)
Chloride: 100 mmol/L (ref 98–111)
Creatinine, Ser: 1.59 mg/dL — ABNORMAL HIGH (ref 0.61–1.24)
GFR, Estimated: 44 mL/min — ABNORMAL LOW (ref >60)
Glucose, Bld: 94 mg/dL (ref 70–99)
Potassium: 4.6 mmol/L (ref 3.5–5.1)
Sodium: 136 mmol/L (ref 135–145)

## 2022-05-10 NOTE — Progress Notes (Signed)
Pt in for repeat EKG today after being on Amio 200 mg BID for 1 week, EKG with afib HR 116, per Dr Haroldine Laws sch pt for tee w/dccv, check labs today (inr/cbc/bmet) orders placed, will arrange.

## 2022-05-10 NOTE — Patient Instructions (Signed)
We will call you with date and time of Cardioversion once discussed with Dr Haroldine Laws  You are scheduled for a Cardioversion on ________________ with Dr.___________  Please arrive at the [facility] [facility] at _________ a.m. on the day of your procedure.  DIET INSTRUCTIONS:  Nothing to eat or drink after midnight except your medications with a sip of water.   Labs: _DONE TODAY_____________  Medications:  DO NOT take Furosemide AM of Cardioversion.         YOU MAY TAKE ALL of your remaining medications with a small amount of water.  Must have a responsible person to drive you home.  Bring a current list of your medications and current insurance cards.    If you have any questions after you get home, please call the office at [phone]

## 2022-05-11 ENCOUNTER — Ambulatory Visit (INDEPENDENT_AMBULATORY_CARE_PROVIDER_SITE_OTHER): Payer: Medicare Other

## 2022-05-11 ENCOUNTER — Telehealth: Payer: Self-pay

## 2022-05-11 NOTE — Telephone Encounter (Signed)
Contacted pt by phone today to advise of INR result received from cardiology performed at their clinic yesterday. While on the phone pt reports strange mark on his back.  Reported he has two marks on his back about the size of two hand which are similar in color to a bruise but pt and his wife report they do not think it is a bruise do to appearance being slightly different. Appeared 2 days ago. Pt was at cardiology yesterday and asked the nurse and tech to look at it and they said they did not think it was a bruise either. Pt denies pain in the area but does report intermittent neck pain over the last 3 days. He explained it could be the way he fell asleep in his chair but it hurts one day and not the next. Today no pain. Denies any other bruising or "abnormal marks." Pt denies any other symptoms. Only change in medication is addition of amiodarone. INR from cardiology yesterday is 2.2, in his range. Advised pt he should probably have the area assessed by PCP. Pt reports he has a lot of other things going on right now and he does not want an apt unless PCP thinks he needs it. Advised a msg would be sent to PCP to let him know of our conversation. Advised  office may call to f/u. Advised if any changes to contact office. Advised of ER precautions. Pt and wife verbalized understanding.

## 2022-05-11 NOTE — Telephone Encounter (Signed)
Mrs. [name] notified as instructed by telephone.  Agrees with plan.  Will call back if anything changes.

## 2022-05-11 NOTE — Patient Instructions (Addendum)
Pre visit review using our clinic review tool, if applicable. No additional management support is needed unless otherwise documented below in the visit note.  Continue 1/2 tablet daily except take 1 tablet on Mondays. Recheck in 2 weeks.

## 2022-05-11 NOTE — Progress Notes (Signed)
Result from cardiology lab yesterday. Pt started amiodarone and will have cardioversion on 1/19.  Continue 1/2 tablet daily except take 1 tablet on Mondays. Recheck in 2 weeks.  Pt also reported he has two marks on his back about the size of two hand which are similar in color to a bruise but pt and his wife report they do not think it is a bruise do to appearance being slightly different. Appeared 2 days ago. Pt was at cardiology yesterday and asked the nurse and tech to look at it and they said they did not think it was a bruise either. Pt denies pain in the area but does report intermittent neck pain over the last 3 days. He explained it could be the way he fell asleep in his chair but it hurts one day and not the next. Today no pain. Denies any other bruising or "abnormal marks." Pt denies any other symptoms. Only change in medication is addition of amiodarone. INR from cardiology yesterday is 2.2, in his range. Advised pt he should probably have the area assessed by PCP. Pt reports he has a lot of other things going on right now and he does not want an apt unless PCP thinks he needs it. Advised a msg would be sent to PCP to let him know of our conversation. Advised [place] office may call to f/u. Advised if any changes to contact office. Advised of ER precautions. Pt and wife verbalized understanding. Sent msg to PCP.

## 2022-05-11 NOTE — Progress Notes (Signed)
I have reviewed and agree with note, evaluation, plan.   I agree with need for PCP input on area of concern  Garret Reddish, MD

## 2022-05-11 NOTE — Telephone Encounter (Signed)
Please call [name]  I am comfortable with him and his wife observing it over the next few days to see if it gets better on its own.

## 2022-05-12 ENCOUNTER — Ambulatory Visit: Payer: Medicare Other

## 2022-05-13 ENCOUNTER — Ambulatory Visit (HOSPITAL_COMMUNITY): Admit: 2022-05-13 | Payer: Medicare Other | Admitting: Internal Medicine

## 2022-05-13 ENCOUNTER — Encounter (HOSPITAL_COMMUNITY): Payer: Self-pay

## 2022-05-13 ENCOUNTER — Ambulatory Visit: Payer: Medicare Other | Admitting: Cardiovascular Disease

## 2022-05-13 SURGERY — RIGHT/LEFT HEART CATH AND CORONARY ANGIOGRAPHY
Anesthesia: LOCAL

## 2022-05-16 ENCOUNTER — Telehealth: Payer: Self-pay

## 2022-05-16 ENCOUNTER — Other Ambulatory Visit: Payer: Self-pay | Admitting: *Deleted

## 2022-05-16 DIAGNOSIS — I480 Paroxysmal atrial fibrillation: Secondary | ICD-10-CM

## 2022-05-16 MED ORDER — METOPROLOL SUCCINATE ER 25 MG PO TB24: 25.0000 mg | ORAL_TABLET | Freq: Two times a day (BID) | ORAL | 3 refills | Status: DC

## 2022-05-16 NOTE — Telephone Encounter (Signed)
Spoke with [name] regarding TEE/DCCV appointment for Tuesday the 29th at 0730.  All instructions reviewed. Per Dr. Haroldine Laws instructions given to take metoprolol '25mg'$  twice a day.

## 2022-05-19 ENCOUNTER — Ambulatory Visit: Payer: Medicare Other

## 2022-05-23 ENCOUNTER — Encounter
Admission: RE | Disposition: A | Discharge status: Home / Self Care | Payer: Self-pay | Source: Ambulatory Visit | Attending: Internal Medicine

## 2022-05-23 ENCOUNTER — Ambulatory Visit: Payer: Medicare Other | Admitting: Anesthesiology

## 2022-05-23 ENCOUNTER — Ambulatory Visit (HOSPITAL_BASED_OUTPATIENT_CLINIC_OR_DEPARTMENT_OTHER)
Admission: RE | Admit: 2022-05-23 | Discharge: 2022-05-23 | Disposition: A | Discharge status: Home / Self Care | Payer: Medicare Other | Source: Ambulatory Visit | Attending: Internal Medicine | Admitting: Internal Medicine

## 2022-05-23 ENCOUNTER — Ambulatory Visit
Admission: RE | Admit: 2022-05-23 | Discharge: 2022-05-23 | Disposition: A | Discharge status: Home / Self Care | Payer: Medicare Other | Source: Ambulatory Visit | Attending: Internal Medicine | Admitting: Internal Medicine

## 2022-05-23 ENCOUNTER — Other Ambulatory Visit: Payer: Self-pay

## 2022-05-23 ENCOUNTER — Encounter: Payer: Self-pay | Admitting: Internal Medicine

## 2022-05-23 DIAGNOSIS — I701 Atherosclerosis of renal artery: Secondary | ICD-10-CM | POA: Diagnosis not present

## 2022-05-23 DIAGNOSIS — Z7901 Long term (current) use of anticoagulants: Secondary | ICD-10-CM | POA: Insufficient documentation

## 2022-05-23 DIAGNOSIS — I350 Nonrheumatic aortic (valve) stenosis: Secondary | ICD-10-CM

## 2022-05-23 DIAGNOSIS — I080 Rheumatic disorders of both mitral and aortic valves: Secondary | ICD-10-CM | POA: Insufficient documentation

## 2022-05-23 DIAGNOSIS — Z8673 Personal history of transient ischemic attack (TIA), and cerebral infarction without residual deficits: Secondary | ICD-10-CM | POA: Insufficient documentation

## 2022-05-23 DIAGNOSIS — I4891 Unspecified atrial fibrillation: Secondary | ICD-10-CM

## 2022-05-23 DIAGNOSIS — I2510 Atherosclerotic heart disease of native coronary artery without angina pectoris: Secondary | ICD-10-CM | POA: Insufficient documentation

## 2022-05-23 DIAGNOSIS — I130 Hypertensive heart and chronic kidney disease with heart failure and stage 1 through stage 4 chronic kidney disease, or unspecified chronic kidney disease: Secondary | ICD-10-CM | POA: Insufficient documentation

## 2022-05-23 DIAGNOSIS — E039 Hypothyroidism, unspecified: Secondary | ICD-10-CM | POA: Diagnosis not present

## 2022-05-23 DIAGNOSIS — N401 Enlarged prostate with lower urinary tract symptoms: Secondary | ICD-10-CM | POA: Diagnosis not present

## 2022-05-23 DIAGNOSIS — I700 Atherosclerosis of aorta: Secondary | ICD-10-CM | POA: Insufficient documentation

## 2022-05-23 DIAGNOSIS — I480 Paroxysmal atrial fibrillation: Secondary | ICD-10-CM | POA: Diagnosis not present

## 2022-05-23 DIAGNOSIS — Z79899 Other long term (current) drug therapy: Secondary | ICD-10-CM | POA: Insufficient documentation

## 2022-05-23 DIAGNOSIS — R338 Other retention of urine: Secondary | ICD-10-CM | POA: Insufficient documentation

## 2022-05-23 DIAGNOSIS — N1831 Chronic kidney disease, stage 3a: Secondary | ICD-10-CM | POA: Diagnosis not present

## 2022-05-23 DIAGNOSIS — I340 Nonrheumatic mitral (valve) insufficiency: Secondary | ICD-10-CM | POA: Diagnosis not present

## 2022-05-23 DIAGNOSIS — Z951 Presence of aortocoronary bypass graft: Secondary | ICD-10-CM | POA: Insufficient documentation

## 2022-05-23 DIAGNOSIS — I361 Nonrheumatic tricuspid (valve) insufficiency: Secondary | ICD-10-CM

## 2022-05-23 DIAGNOSIS — I5032 Chronic diastolic (congestive) heart failure: Secondary | ICD-10-CM | POA: Insufficient documentation

## 2022-05-23 DIAGNOSIS — I739 Peripheral vascular disease, unspecified: Secondary | ICD-10-CM | POA: Diagnosis not present

## 2022-05-23 DIAGNOSIS — Z01810 Encounter for preprocedural cardiovascular examination: Secondary | ICD-10-CM | POA: Diagnosis not present

## 2022-05-23 DIAGNOSIS — J449 Chronic obstructive pulmonary disease, unspecified: Secondary | ICD-10-CM | POA: Insufficient documentation

## 2022-05-23 HISTORY — PX: TEE WITHOUT CARDIOVERSION: SHX5443

## 2022-05-23 HISTORY — PX: CARDIOVERSION: SHX1299

## 2022-05-23 LAB — PROTIME-INR
INR: 2.9 — ABNORMAL HIGH (ref 0.8–1.2)
Prothrombin Time: 30.3 seconds — ABNORMAL HIGH (ref 11.4–15.2)

## 2022-05-23 SURGERY — ECHOCARDIOGRAM, TRANSESOPHAGEAL
Anesthesia: General

## 2022-05-23 MED ORDER — PROPOFOL 10 MG/ML IV BOLUS
INTRAVENOUS | Status: DC | PRN
  Administered 2022-05-23: 10 mg via INTRAVENOUS
  Administered 2022-05-23: 20 mg via INTRAVENOUS
  Administered 2022-05-23 (×4): 10 mg via INTRAVENOUS
  Administered 2022-05-23: 50 mg via INTRAVENOUS

## 2022-05-23 MED ORDER — ESMOLOL HCL 100 MG/10ML IV SOLN
INTRAVENOUS | Status: AC
  Filled 2022-05-23: qty 10

## 2022-05-23 MED ORDER — SODIUM CHLORIDE 0.9 % IV SOLN: INTRAVENOUS | Status: DC

## 2022-05-23 MED ORDER — EPHEDRINE SULFATE (PRESSORS) 50 MG/ML IJ SOLN
INTRAMUSCULAR | Status: DC | PRN
  Administered 2022-05-23 (×2): 5 mg via INTRAVENOUS

## 2022-05-23 MED ORDER — ATROPINE SULFATE 1 MG/10ML IJ SOSY
PREFILLED_SYRINGE | INTRAMUSCULAR | Status: AC
  Filled 2022-05-23: qty 10

## 2022-05-23 MED ORDER — PROPOFOL 1000 MG/100ML IV EMUL
INTRAVENOUS | Status: AC
  Filled 2022-05-23: qty 100

## 2022-05-23 MED ORDER — EPINEPHRINE 1 MG/10ML IJ SOSY
PREFILLED_SYRINGE | INTRAMUSCULAR | Status: AC
  Filled 2022-05-23: qty 10

## 2022-05-23 MED ORDER — BUTAMBEN-TETRACAINE-BENZOCAINE 2-2-14 % EX AERO
INHALATION_SPRAY | CUTANEOUS | Status: AC
  Filled 2022-05-23: qty 5

## 2022-05-23 MED ORDER — PHENYLEPHRINE 80 MCG/ML (10ML) SYRINGE FOR IV PUSH (FOR BLOOD PRESSURE SUPPORT)
PREFILLED_SYRINGE | INTRAVENOUS | Status: AC
  Filled 2022-05-23: qty 20

## 2022-05-23 MED ORDER — PHENYLEPHRINE HCL (PRESSORS) 10 MG/ML IV SOLN
INTRAVENOUS | Status: DC | PRN
  Administered 2022-05-23: 160 ug via INTRAVENOUS
  Administered 2022-05-23: 40 ug via INTRAVENOUS
  Administered 2022-05-23: 120 ug via INTRAVENOUS
  Administered 2022-05-23: 320 ug via INTRAVENOUS
  Administered 2022-05-23: 240 ug via INTRAVENOUS

## 2022-05-23 MED ORDER — EPHEDRINE 5 MG/ML INJ
INTRAVENOUS | Status: AC
  Filled 2022-05-23: qty 5

## 2022-05-23 NOTE — Anesthesia Postprocedure Evaluation (Signed)
Anesthesia Post Note  Patient: [name]  Procedure(s) Performed: TRANSESOPHAGEAL ECHOCARDIOGRAM (TEE) CARDIOVERSION  Patient location during evaluation: PACU Anesthesia Type: General Level of consciousness: awake and alert Pain management: pain level controlled Vital Signs Assessment: post-procedure vital signs reviewed and stable Respiratory status: spontaneous breathing, nonlabored ventilation and respiratory function stable Cardiovascular status: blood pressure returned to baseline and stable Postop Assessment: no apparent nausea or vomiting Anesthetic complications: no   No notable events documented.   Last Vitals:  Vitals:   05/23/22 0815 05/23/22 0830  BP: 103/60 99/60  Pulse: 78 80  Resp: (!) 21 (!) 23  Temp:    SpO2: 96% 95%    Last Pain:  Vitals:   05/23/22 0830  TempSrc:   PainSc: 0-No pain                 [name]

## 2022-05-23 NOTE — Transfer of Care (Signed)
Immediate Anesthesia Transfer of Care Note  Patient: [name]  Procedure(s) Performed: TRANSESOPHAGEAL ECHOCARDIOGRAM (TEE) CARDIOVERSION  Patient Location:  Special Procedures  Anesthesia Type:General  Level of Consciousness: drowsy and patient cooperative  Airway & Oxygen Therapy: Patient Spontanous Breathing and Patient connected to nasal cannula oxygen  Post-op Assessment: Report given to RN and Post -op Vital signs reviewed and stable  Post vital signs: Reviewed and stable  Last Vitals:  Vitals Value Taken Time  BP 111/69 05/23/22 0800  Temp    Pulse 90 05/23/22 0801  Resp 22 05/23/22 0801  SpO2 96 % 05/23/22 0801  Vitals shown include unvalidated device data.  Last Pain:  Vitals:   05/23/22 0705  TempSrc: Oral  PainSc: 0-No pain         Complications: No notable events documented.

## 2022-05-23 NOTE — Anesthesia Preprocedure Evaluation (Addendum)
Anesthesia Evaluation  Patient identified by MRN, date of birth, ID band Patient awake    Reviewed: Allergy & Precautions, H&P , NPO status , Patient's Chart, lab work & pertinent test results  Airway Mallampati: II  TM Distance: >3 FB Neck ROM: full    Dental no notable dental hx.    Pulmonary COPD, former smoker   Pulmonary exam normal        Cardiovascular Exercise Tolerance: Poor hypertension, + CAD, + CABG, + Peripheral Vascular Disease and +CHF  + dysrhythmias Atrial Fibrillation + Valvular Problems/Murmurs AS  Rhythm:Irregular Rate:Tachycardia + Systolic murmurs- Peripheral Edema Subclavian artery stenosis, left s/p stent  02/2021 Cath: LM 100, RCA 90ost, 153m free LIMA->LAD nl, VG->OM2 nl, VG->RPDA nl. Mod AS (mean grad 110mg, AVA 1.1cm^2). RHC w/ elev filling pressures.  02/2021 Echo: EF 55%, GrII DD. Mild MR. Mild to mod TR. Sev AS by VTI.  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 06/2011 Echo: EF 35-40%; b. 06/2012 Echo: EF 50%; c.               12/2016 Echo: EF 55-60%, no rwma, GRI DD, mild AS/MR, nl               RV fxn, nl RVSP; d. 02/2021 Echo: EF 55%, GrII DD. Mild               MR. Mild to mod TR. Sev AS by VTI. No date: AAA (abdominal aortic aneurysm) (HCP[place]    Comment:  a. 05/2020 Abd u/s: 3.6cm. 05/01/2013: Aortic calcification (HCC) No date: Bilateral renal artery stenosis (HCG[place]    Comment:  a. 06/2013 Angio/PTA: LRA:  95 (5x18 Herculink stent), RRA              60ost; b. 04/2021 Angio: mod RRA stenosis w/ patent LRA               stent. No date: Carotid arterial disease (HCPra[place] du Roc[place]    Comment:  a. 05/2020 Carotid U/S: RICA 04-28-54%LICA 4025-63%o date: CHF (congestive heart failure) (HCC) No date: Chronic kidney disease No date: COPD (chronic obstructive pulmonary disease) (HCC) No date: Coronary artery disease     Comment:  a. 2013 s/p CABG x 3 (LIMA->LAD, VG->OM, VG->RPDA; b.               06/2012 MV: no ischemia; c. 02/2021 Cath: LM 100, RCA               90ost, 10072mree LIMA->LAD nl, VG->OM2 nl, VG->RPDA nl.               Mod AS (mean grad 32m72m AVA 1.1cm^2). RHC w/ elev               filling pressures. 04/11/2008: History of prior cigarette smoking     Comment:  Qualifier: Diagnosis of  By: BedsDiona Browner Amy   No date: Hyperlipidemia No date: Hypertension No date: Hypothyroidism 07/12/2012: Intraventricular hemorrhage (HCC) No date: Ischemic cardiomyopathy  Comment:  a. 06/2011 Echo: EF 35-40%; b. 06/2012 Echo: EF 50%; c.               12/2016 Echo: EF 55-60%, no rwma, GRI DD; d. 02/2021 Echo:              Ef 55%, GrII DD. No date: Moderate aortic stenosis     Comment:  a. 02/2021 Echo: EF 55%, GrII DD. Sev Ca2+ of AoV. Sev               AS w/ AVA by VTI 0.79cm^2; b. 02/2021 Cath: Mod AS w/               mean grad 60mHg. AVA 1.1cm^2. No date: PAF (paroxysmal atrial fibrillation) (HCC) No date: Peripheral arterial disease (HL[facility]     Comment:  a. Previous left lower extremity stenting by Dr. SJamal Collin              b. 12/2012 s/p bilat ostial common iliac stenting; c.               04/2021 Angio: Patent LRA stent, mod RRA stenosis. Small               AAA. Sev plaque RSFA 2/ subtl occl of inf branch of               profunda. Diff Ca2+ SFA dzs w/ collats from profunda and               3V runoff. Diffuse LSFA dzs w/ collats from profunda.               Subtl PT dzs. 3V runoff-->Med Rx. 08/13/2012: Right  leg DVT ([facility] 05/2012: Subclavian artery stenosis, left (HCC)     Comment:  Status post stenting of the ostium and self-expanding               stent placement to the left axillary artery No date: Venous insufficiency  Past Surgical History: 07/10/2013: ABDOMINAL ANGIOGRAM     Comment:  WITH BI-FEMORAL RUNOFF       DR AFletcher Anon09/06/2012: ABDOMINAL AORTAGRAM; N/A     Comment:  Procedure: ABDOMINAL AORTAGRAM;  Surgeon: MWellington Hampshire MD;  [facility];  Service:               Cardiovascular;  Laterality: N/A; 07/10/2013: ABDOMINAL AORTAGRAM; N/A     Comment:  Procedure: ABDOMINAL AORTAGRAM;  Surgeon: MWellington Hampshire MD;  [facility];  Service:               Cardiovascular;  Laterality: N/A; 05/12/2021: ABDOMINAL AORTOGRAM W/LOWER EXTREMITY; N/A     Comment:  Procedure: ABDOMINAL AORTOGRAM W/LOWER EXTREMITY;                Surgeon: AWellington Hampshire MD;  [facility];  Service: Cardiovascular;  Laterality: N/A; 05/2012: ANGIOPLASTY / STENTING FEMORAL 12/26/2012: ANGIOPLASTY / STENTING ILIAC; Bilateral 06/20/2012: ARCH AORTOGRAM; N/A     Comment:  Procedure: ARCH AORTOGRAM;  Surgeon: MWellington Hampshire               MD;  [facility];  Service: Cardiovascular;  Laterality: N/A; 05/2012: CARDIAC CATHETERIZATION     Comment:  [place] 1990's: CHOLECYSTECTOMY 07/11/2011: CORONARY ARTERY BYPASS GRAFT     Comment:  Procedure: CORONARY ARTERY BYPASS GRAFTING (CABG);                Surgeon: Grace Isaac, MD;  Location: [facility];                Service: Open Heart Surgery;  Laterality: N/A;  Times 3.               On Pump. Using right greater saphenous vein and left               internal mammary artery.  No date: CORONARY ARTERY BYPASS GRAFT No date: HERNIA REPAIR 12/26/2012: INSERTION OF ILIAC STENT; Bilateral     Comment:  Procedure: INSERTION OF ILIAC STENT;  Surgeon: Wellington Hampshire, MD;  Location: [facility] CATH LAB;  Service:               Cardiovascular;  Laterality: Bilateral;  Bilateral Common              Iliac Artery 07/07/2011: LEFT AND RIGHT HEART CATHETERIZATION WITH CORONARY  ANGIOGRAM     Comment:  Procedure: LEFT AND RIGHT HEART CATHETERIZATION WITH               CORONARY ANGIOGRAM;  Surgeon: Minna Merritts, MD;                Location: [place] CATH LAB;  Service: Cardiovascular;; 12/30/2016: LOWER EXTREMITY VENOGRAPHY; Bilateral     Comment:  Procedure: Lower Extremity Venography;  Surgeon:               Katha Cabal, MD;  Location: [place] CV LAB;               Service: Cardiovascular;  Laterality: Bilateral; 06/20/2012: PERCUTANEOUS STENT INTERVENTION     Comment:  Procedure: PERCUTANEOUS STENT INTERVENTION;  Surgeon:               Wellington Hampshire, MD;  Location: [place] CATH LAB;  Service:               Cardiovascular;; 07/10/2013: RENAL ARTERY ANGIOPLASTY; Left     Comment:  DR Fletcher Anon 03/15/2021: RIGHT/LEFT HEART CATH AND CORONARY/GRAFT ANGIOGRAPHY; N/A     Comment:  Procedure: RIGHT/LEFT HEART CATH AND CORONARY/GRAFT               ANGIOGRAPHY;  Surgeon: Wellington Hampshire, MD;  Location:               [place] CV LAB;  Service: Cardiovascular;                Laterality: N/A; 05/2012: SUBCLAVIAN ARTERY STENT     Comment:  "2 stents" (12/26/2012) Spring 2017: TOOTH EXTRACTION     Comment:  mulitple (6) 07/2012: VENA CAVA FILTER PLACEMENT     Comment:  Removable     Reproductive/Obstetrics negative OB ROS                             Anesthesia Physical Anesthesia Plan  ASA: 3  Anesthesia Plan: General   Post-op Pain Management:    Induction: Intravenous  PONV Risk Score and Plan: Propofol infusion and TIVA  Airway Management Planned: Natural Airway  Additional Equipment:   Intra-op Plan:   Post-operative Plan:   Informed Consent: I have reviewed the patients History and Physical, chart, labs  and discussed the procedure including the risks, benefits and alternatives for the proposed anesthesia with the patient or authorized representative who has indicated his/her understanding and acceptance.     Dental Advisory Given  Plan Discussed with: CRNA and Surgeon  Anesthesia Plan Comments:         Anesthesia Quick Evaluation

## 2022-05-23 NOTE — CV Procedure (Signed)
   TRANSESOPHAGEAL ECHOCARDIOGRAM GUIDED DIRECT CURRENT CARDIOVERSION  NAME:  [name]   MRN: [id number] DOB:  1940/11/08   ADMIT DATE: 05/23/2022  INDICATIONS:  Atrial fibrillation/flutter  PROCEDURE:   Informed consent was obtained prior to the procedure. The risks, benefits and alternatives for the procedure were discussed and the patient comprehended these risks.  Risks include, but are not limited to, cough, sore throat, vomiting, nausea, somnolence, esophageal and stomach trauma or perforation, bleeding, low blood pressure, aspiration, pneumonia, infection, trauma to the teeth and death.    After a procedural time-out, the oropharynx was anesthetized and the patient was sedated by the anesthesia service. The transesophageal probe was inserted in the esophagus and stomach without difficulty and multiple views were obtained.   FINDINGS:  LEFT VENTRICLE: EF = 55% No RWMA  RIGHT VENTRICLE: Normal  LEFT ATRIUM: Moderately dilated  LEFT ATRIAL APPENDAGE: No clot  RIGHT ATRIUM: Mildly dilated  AORTIC VALVE:  Trileaflet. Heavily calcified. Markedly reduced leaflet excursion. Eccentric orifice - hard to planimeter. Mean gradient 69mHG. Trivial AI  MITRAL VALVE:    Normal. Mild MR  TRICUSPID VALVE: Normal Mild TR  PULMONIC VALVE: Normal Trivial PR  INTERATRIAL SEPTUM: No PFO/ASD  PERICARDIUM: No effusion  DESCENDING AORTA: Severe palque   CARDIOVERSION:     Indications:  Atrial Fibrillation  Procedure Details:  Once the TEE was complete, the patient had the defibrillator pads placed in the anterior and posterior position. Once an appropriate level of sedation was achieved, the patient received a single biphasic, synchronized 150J shock with prompt conversion to sinus rhythm. No apparent complications.   DGlori Bickers MD  8:03 AM

## 2022-05-23 NOTE — Interval H&P Note (Signed)
History and Physical Interval Note:  05/23/2022 7:25 AM  [name]  has presented today for surgery, with the diagnosis of Cardioversion Afib.  The various methods of treatment have been discussed with the patient and family. After consideration of risks, benefits and other options for treatment, the patient has consented to  Procedure(s): TRANSESOPHAGEAL ECHOCARDIOGRAM (TEE) (N/A) CARDIOVERSION (N/A) as a surgical intervention.  The patient's history has been reviewed, patient examined, no change in status, stable for surgery.  I have reviewed the patient's chart and labs.  Questions were answered to the patient's satisfaction.     Javarious Elsayed

## 2022-05-23 NOTE — Progress Notes (Signed)
*  PRELIMINARY RESULTS* Echocardiogram Echocardiogram Transesophageal has been performed.  [name] 05/23/2022, 8:07 AM

## 2022-05-24 ENCOUNTER — Telehealth: Payer: Self-pay

## 2022-05-24 ENCOUNTER — Encounter: Payer: Self-pay | Admitting: Internal Medicine

## 2022-05-24 ENCOUNTER — Other Ambulatory Visit: Payer: Self-pay

## 2022-05-24 NOTE — Telephone Encounter (Signed)
Pt reports he had a cardioversion yesterday and INR was 2.9.  Pt had apt for f/u INR this week in the coumadin clinic.   Pt is doing well but does have SOB with activity. Advised pt to f/u with cardiology to make them aware. He reports they told him yesterday that this could occur. Advised again to f/u with cardiology if SOB worsened or go to the ER.  Advised pt to continue weekly dosing given at last coumadin clinic apt on 1/17. Pt verbalized understanding. Rescheduled apt for coumadin clinic.

## 2022-05-26 ENCOUNTER — Other Ambulatory Visit: Payer: Self-pay | Admitting: Cardiovascular Disease

## 2022-05-26 ENCOUNTER — Ambulatory Visit: Payer: Medicare Other

## 2022-05-26 LAB — ECHO TEE
AV Mean grad: 13.3 mmHg
AV Peak grad: 20.4 mmHg
Ao pk vel: 2.26 m/s

## 2022-06-01 NOTE — Telephone Encounter (Signed)
Contacted [name] and [name]'s wife, Joycelyn Schmid, to inquire how [name] is doing one week after cardioversion. Joycelyn Schmid reports [name] is feeling better and has more energy and better mood. He is getting out more, sleeping well, and eating well. She does report he is having mild edema in feet but is not consistent with compression stockings or keeping feet elevated. She reports she has been advising him he needs to do these things. Advised if edema worsens to contact office or cardiology. [name] has f/u apt with cardiology on 2/13. Advised if any changes in medications to contact coumadin clinic. [name] and [name] verbalized understanding.

## 2022-06-07 ENCOUNTER — Telehealth: Payer: Self-pay

## 2022-06-07 ENCOUNTER — Other Ambulatory Visit: Payer: Self-pay | Admitting: Cardiology

## 2022-06-07 ENCOUNTER — Other Ambulatory Visit
Admission: RE | Admit: 2022-06-07 | Discharge: 2022-06-07 | Disposition: A | Discharge status: Home / Self Care | Payer: Medicare Other | Source: Ambulatory Visit | Attending: Cardiology | Admitting: Cardiology

## 2022-06-07 ENCOUNTER — Ambulatory Visit (HOSPITAL_BASED_OUTPATIENT_CLINIC_OR_DEPARTMENT_OTHER): Payer: Medicare Other | Admitting: Cardiology

## 2022-06-07 ENCOUNTER — Encounter: Payer: Self-pay | Admitting: Cardiology

## 2022-06-07 VITALS — BP 101/60 | HR 110 | Wt 172.0 lb

## 2022-06-07 DIAGNOSIS — J449 Chronic obstructive pulmonary disease, unspecified: Secondary | ICD-10-CM | POA: Insufficient documentation

## 2022-06-07 DIAGNOSIS — I5032 Chronic diastolic (congestive) heart failure: Secondary | ICD-10-CM | POA: Diagnosis not present

## 2022-06-07 DIAGNOSIS — I739 Peripheral vascular disease, unspecified: Secondary | ICD-10-CM | POA: Insufficient documentation

## 2022-06-07 DIAGNOSIS — I350 Nonrheumatic aortic (valve) stenosis: Secondary | ICD-10-CM | POA: Insufficient documentation

## 2022-06-07 DIAGNOSIS — I4819 Other persistent atrial fibrillation: Secondary | ICD-10-CM

## 2022-06-07 DIAGNOSIS — Z8673 Personal history of transient ischemic attack (TIA), and cerebral infarction without residual deficits: Secondary | ICD-10-CM | POA: Insufficient documentation

## 2022-06-07 DIAGNOSIS — I5022 Chronic systolic (congestive) heart failure: Secondary | ICD-10-CM | POA: Insufficient documentation

## 2022-06-07 DIAGNOSIS — Z951 Presence of aortocoronary bypass graft: Secondary | ICD-10-CM | POA: Insufficient documentation

## 2022-06-07 DIAGNOSIS — I2510 Atherosclerotic heart disease of native coronary artery without angina pectoris: Secondary | ICD-10-CM | POA: Insufficient documentation

## 2022-06-07 DIAGNOSIS — Z7901 Long term (current) use of anticoagulants: Secondary | ICD-10-CM | POA: Insufficient documentation

## 2022-06-07 DIAGNOSIS — I130 Hypertensive heart and chronic kidney disease with heart failure and stage 1 through stage 4 chronic kidney disease, or unspecified chronic kidney disease: Secondary | ICD-10-CM | POA: Insufficient documentation

## 2022-06-07 DIAGNOSIS — Z7902 Long term (current) use of antithrombotics/antiplatelets: Secondary | ICD-10-CM | POA: Insufficient documentation

## 2022-06-07 DIAGNOSIS — Z79899 Other long term (current) drug therapy: Secondary | ICD-10-CM | POA: Insufficient documentation

## 2022-06-07 DIAGNOSIS — Z8679 Personal history of other diseases of the circulatory system: Secondary | ICD-10-CM | POA: Insufficient documentation

## 2022-06-07 DIAGNOSIS — I701 Atherosclerosis of renal artery: Secondary | ICD-10-CM | POA: Insufficient documentation

## 2022-06-07 DIAGNOSIS — I82503 Chronic embolism and thrombosis of unspecified deep veins of lower extremity, bilateral: Secondary | ICD-10-CM | POA: Insufficient documentation

## 2022-06-07 DIAGNOSIS — N1831 Chronic kidney disease, stage 3a: Secondary | ICD-10-CM | POA: Insufficient documentation

## 2022-06-07 DIAGNOSIS — I509 Heart failure, unspecified: Secondary | ICD-10-CM | POA: Diagnosis not present

## 2022-06-07 LAB — COMPREHENSIVE METABOLIC PANEL
ALT: 11 U/L (ref 0–44)
AST: 15 U/L (ref 15–41)
Albumin: 3.4 g/dL — ABNORMAL LOW (ref 3.5–5.0)
Alkaline Phosphatase: 72 U/L (ref 38–126)
Anion gap: 9 (ref 5–15)
BUN: 26 mg/dL — ABNORMAL HIGH (ref 8–23)
CO2: 26 mmol/L (ref 22–32)
Calcium: 8.9 mg/dL (ref 8.9–10.3)
Chloride: 105 mmol/L (ref 98–111)
Creatinine, Ser: 1.76 mg/dL — ABNORMAL HIGH (ref 0.61–1.24)
GFR, Estimated: 38 mL/min — ABNORMAL LOW (ref >60)
Glucose, Bld: 69 mg/dL — ABNORMAL LOW (ref 70–99)
Potassium: 4.4 mmol/L (ref 3.5–5.1)
Sodium: 140 mmol/L (ref 135–145)
Total Bilirubin: 0.8 mg/dL (ref 0.3–1.2)
Total Protein: 7 g/dL (ref 6.5–8.1)

## 2022-06-07 LAB — CBC
HCT: 39.3 % (ref 39.0–52.0)
Hemoglobin: 12 g/dL — ABNORMAL LOW (ref 13.0–17.0)
MCH: 27 pg (ref 26.0–34.0)
MCHC: 30.5 g/dL (ref 30.0–36.0)
MCV: 88.3 fL (ref 80.0–100.0)
Platelets: 326 10*3/uL (ref 150–400)
RBC: 4.45 MIL/uL (ref 4.22–5.81)
RDW: 16.5 % — ABNORMAL HIGH (ref 11.5–15.5)
WBC: 5.7 10*3/uL (ref 4.0–10.5)
nRBC: 0 % (ref 0.0–0.2)

## 2022-06-07 LAB — BRAIN NATRIURETIC PEPTIDE: B Natriuretic Peptide: 360 pg/mL — ABNORMAL HIGH (ref 0.0–100.0)

## 2022-06-07 LAB — TSH: TSH: 0.645 u[IU]/mL (ref 0.350–4.500)

## 2022-06-07 MED ORDER — FUROSEMIDE 40 MG PO TABS: 40.0000 mg | ORAL_TABLET | Freq: Two times a day (BID) | ORAL | 3 refills | Status: DC

## 2022-06-07 NOTE — Patient Instructions (Addendum)
Medication Changes:  Increase Lasix to 40 mg take twice a day.   Lab Work:  Labs done today, your results will be available in MyChart, we will contact you for abnormal readings.   Please come in 10 days to [place] [facility] to get your BMET lab work complete.  1st desk on the right to check in (REGISTRATION)  Lab hours: Monday- Friday (7:30 am- 5:30 pm)     Testing/Procedures:  You are scheduled for a Cardioversion on ___Monday_2/19____ with Dr. Bensimhon___________ Please arrive at the [place] [facility] at ___06:30______ a.m. on the day of your procedure.  DIET INSTRUCTIONS:  Nothing to eat or drink after midnight except your medications with a  sip of water.         Labs: _You will have INR lab work done on the day of your cardioversion._  Medications:  YOU MAY TAKE ALL of your remaining medications with a small amount of water.  Must have a responsible person to drive you home.  Bring a current list of your medications and current insurance cards.    If you have any questions after you get home, please call the office at [phone]     Special Instructions // Education:  Do the following things EVERYDAY: Weigh yourself in the morning before breakfast. Write it down and keep it in a log. Take your medicines as prescribed Eat low salt foods--Limit salt (sodium) to 2000 mg per day.  Stay as active as you can everyday Limit all fluids for the day to less than 2 liters   Follow-Up in: follow in two weeks.     If you have any questions or concerns before your next appointment please send [place] a message through [place] or call our office at [phone] Monday-Friday 8 am-5 pm.   If you have an urgent need after hours on the weekend please call your Primary Cardiologist or the [facility] in [place] at [phone].

## 2022-06-07 NOTE — H&P (View-Only) (Signed)
ADVANCED HF CLINIC NOTE  Referring Physician: Darylene Price, NP  Primary Care: Owens Loffler, MD Primary Cardiologist: [name], MD HF Cardiologist: Dr. Haroldine Laws  HPI:  [name] is a 82 y.o. male with a history of AAA, carotid disease, CAD s/p CABG, CKD 3a, renal artery stenosis, COPD, intraventricular hemorrhage, moderate AS, PAF, PAD, DVT, subclavian artery stenosis, tobacco use and chronic heart failure.   Underwent CABG x 3 in 2013 with placement of a LIMA to LAD, vein graft to the RPDA, and vein graft to the obtuse marginal.  He suffered an intracranial hemorrhage in 2014 which was suspected to be due to malignant hypertension and dual antiplatelet therapy.  Shortly thereafter, he was diagnosed with extensive right-sided DVT and underwent IVC filter in the setting of contraindication for anticoagulation.    In 2018, he had extensive bilateral occlusive DVTs, which required catheter-based therapy and he has been on anticoagulation since then with warfarin.    EF was previously as low as 35 to 40% prior to his bypass surgery with subsequent improvement to 55 to 60% by echo in 2018.   Cath 11/22 and showed 3 out of 3 patent grafts with known occlusive left main and RCA disease.  Aortic stenosis was moderate by gradient 15 mmHg and valve area 1.1 cm.  Right heart catheterization was notable for elevated filling pressures and Lasix 20 mg daily was resumed.  Due to worsening claudication at follow-up, he underwent repeat ABIs which were abnormal at 0.61 on the right and 0.56 on the left.  Lower extremity angiogram was performed January 2023 which showed patent left renal artery stent with moderate right renal artery stenosis, patent bilateral iliac kissing stents extending into the distal aorta with mild restenosis, subtotal occlusion of the inferior branch of the right profunda with diffuse calcified SFA disease and collaterals from the profunda and three-vessel runoff below the knee.      Admitted 10/23 after a fall found to have right rib fracture, pneumothorax and subcutaneous emphysema.  He initially presented to [facility] but was transferred to [facility] to the trauma service.  He was initially quiring 15 L of O2.  CT showed contusion workup was unremarkable, they recommended conservative management as the patient slowly started to require less and less oxygen.  Hospitalization the patient had A-fib with RVR, rate controlled with Coreg.     Admitted 03/23/22 due to acute on chronic diastolic HF w/ significant volume overload. Cardiology consult obtained. Initially given IV lasix with transition to oral diuretics. Discharged after 3 days. Weight 179 on 12/1    Echo report from 03/24/22 showed an EF of 50-55% along with mild/moderate [name], moderate AS with severe calcification of aortic valve.   In 12/23, he had massive fluid overload.  Started on Furoscix x 4 days with 2 days of metolazone as well. He diuresed 15-20 pounds. BP low but no orthostasis. Edema resolved. We scheduled R/L cath (pressures only) to assess hemodynamics and AS but he cancelled.   Given ongoing atrial fibrillation, he had TEE-guided DCCV to NSR in 1/24.  The TEE showed EF 40-45%, normal RV, ?low gradient moderate-severe aortic stenosis (visually severe but with mean gradient 13 mmHg).   He returns for followup of CHF, CAD, atrial fibrillation.  He thinks he was in NSR for about 1.5 wks post-DCCV but has gone back into atrial fibrillation. Weight is up 10 lbs.  He says he still feels better than he did pre-cardioversion.  He is sleeping better.  Legs are chronically weak and "give out" after walking about 30 feet. He sleeps with the head of his bed raised. He is short of breath trying to walk in stores. No lightheadedness/syncope.  No chest pain.   ECG (personally reviewed): Atrial fibrillation rate 117, IVCD 138 msec  Labs (1/24): K 4.6, creatinine 1.59   Past Medical History:  Diagnosis Date    ([place]) heart failure with improved ejection fraction ([place])    a. 06/2011 Echo: EF 35-40%; b. 06/2012 Echo: EF 50%; c. 12/2016 Echo: EF 55-60%, no rwma, GRI DD, mild AS/[name], nl RV fxn, nl RVSP; d. 02/2021 Echo: EF 55%, GrII DD. Mild [name]. Mild to mod TR. Sev AS by VTI.   AAA (abdominal aortic aneurysm) ([place])    a. 05/2020 Abd u/s: 3.6cm.   Aortic calcification (HCC) 05/01/2013   Bilateral renal artery stenosis ([place])    a. 06/2013 Angio/PTA: LRA: 95 (5x18 Herculink stent), RRA 60ost; b. 04/2021 Angio: mod RRA stenosis w/ patent LRA stent.   Carotid arterial disease ([place])    a. 05/2020 Carotid U/S: RICA [id number], LICA [id number]   CHF (congestive heart failure) (HCC)    Chronic kidney disease    COPD (chronic obstructive pulmonary disease) (HCC)    Coronary artery disease    a. 2013 s/p CABG x 3 (LIMA->LAD, VG->OM, VG->RPDA; b. 06/2012 MV: no ischemia; c. 02/2021 Cath: LM 100, RCA 90ost, 179m free LIMA->LAD nl, VG->OM2 nl, VG->RPDA nl. Mod AS (mean grad 173mg, AVA 1.1cm^2). RHC w/ elev filling pressures.   History of prior cigarette smoking 04/11/2008   Qualifier: Diagnosis of  By: BeDiona BrownerD, Amy     Hyperlipidemia    Hypertension    Hypothyroidism    Intraventricular hemorrhage ([place]/20/2014   Ischemic cardiomyopathy    a. 06/2011 Echo: EF 35-40%; b. 06/2012 Echo: EF 50%; c. 12/2016 Echo: EF 55-60%, no rwma, GRI DD; d. 02/2021 Echo: Ef 55%, GrII DD.   Moderate aortic stenosis    a. 02/2021 Echo: EF 55%, GrII DD. Sev Ca2+ of AoV. Sev AS w/ AVA by VTI 0.79cm^2; b. 02/2021 Cath: Mod AS w/ mean grad 1513m. AVA 1.1cm^2.   PAF (paroxysmal atrial fibrillation) ([place]  Peripheral arterial disease ([place]  a. Previous left lower extremity stenting by Dr. SanJamal Collinb. 12/2012 s/p bilat ostial common iliac stenting; c. 04/2021 Angio: Patent LRA stent, mod RRA stenosis. Small AAA. Sev plaque RSFA 2/ subtl occl of inf branch of profunda. Diff Ca2+ SFA dzs w/ collats from profunda and 3V runoff. Diffuse LSFA dzs w/  collats from profunda. Subtl PT dzs. 3V runoff-->Med Rx.   Right leg DVT ([place]4/21/2014   Subclavian artery stenosis, left ([place]   Status post stenting of the ostium and self-expanding stent placement to the left axillary artery   Venous insufficiency     Current Outpatient Medications  Medication Sig Dispense Refill   albuterol (VENTOLIN HFA) 108 (90 Base) MCG/ACT inhaler Inhale 2 puffs into the lungs every 6 (six) hours as needed for wheezing or shortness of breath. 18 g 0   amiodarone (PACERONE) 200 MG tablet Take 1 tablet (200 mg total) by mouth 2 (two) times daily. 60 tablet 3   atorvastatin (LIPITOR) 20 MG tablet TAKE 1 TABLET BY MOUTH EVERY DAY 30 tablet 5   augmented betamethasone dipropionate (DIPROLENE-AF) 0.05 % cream APPLY TO AFFECTED AREA TWICE A DAY     cilostazol (PLETAL) 50 MG tablet TAKE 1 TABLET BY MOUTH TWICE  A DAY 180 tablet 0   levothyroxine (SYNTHROID) 150 MCG tablet TAKE ONE TABLET BY MOUTH EVERY DAY BEFORE BREAKFAST 90 tablet 0   metoprolol succinate (TOPROL XL) 25 MG 24 hr tablet Take 1 tablet (25 mg total) by mouth 2 (two) times daily. 60 tablet 3   tamsulosin (FLOMAX) 0.4 MG CAPS capsule Take 1 capsule (0.4 mg total) by mouth daily after supper. 30 capsule 6   warfarin (COUMADIN) 5 MG tablet Take 2.5 mg by mouth daily. 1/2 tab daily except 1 tab on mon,wed,frid     furosemide (LASIX) 40 MG tablet Take 1 tablet (40 mg total) by mouth 2 (two) times daily. 60 tablet 3   No current facility-administered medications for this visit.    Allergies  Allergen Reactions   Plavix [Clopidogrel Bisulfate]     Brain hemorrhage prev while on plavix and aspirin      Social History   Socioeconomic History   Marital status: Married    Spouse name: Not on file   Number of children: 2   Years of education: Not on file   Highest education level: Not on file  Occupational History   Occupation: Retired    Fish farm manager: RETIRED    Comment: Land   Tobacco Use   Smoking status: Former    Packs/day: 1.00    Years: 60.00    Total pack years: 60.00    Types: Cigarettes    Quit date: 12/25/2010    Years since quitting: 11.4   Smokeless tobacco: Never  Vaping Use   Vaping Use: Never used  Substance and Sexual Activity   Alcohol use: No   Drug use: No   Sexual activity: Not Currently  Other Topics Concern   Not on file  Social History Narrative   Lives with wife, Joycelyn Schmid   Exercising 3 times a week.    Moderate diet control.   2 dogs   Social Determinants of Health   Financial Resource Strain: Low Risk  (07/05/2021)   Overall Financial Resource Strain (CARDIA)    Difficulty of Paying Living Expenses: Not very hard  Food Insecurity: No Food Insecurity (03/24/2022)   Hunger Vital Sign    Worried About Running Out of Food in the Last Year: Never true    Ran Out of Food in the Last Year: Never true  Transportation Needs: No Transportation Needs (03/24/2022)   PRAPARE - Hydrologist (Medical): No    Lack of Transportation (Non-Medical): No  Physical Activity: Insufficiently Active (07/05/2021)   Exercise Vital Sign    Days of Exercise per Week: 7 days    Minutes of Exercise per Session: 20 min  Stress: No Stress Concern Present (07/05/2021)   [place]    Feeling of Stress : Not at all  Social Connections: Moderately Isolated (07/05/2021)   Social Connection and Isolation Panel [NHANES]    Frequency of Communication with Friends and Family: More than three times a week    Frequency of Social Gatherings with Friends and Family: More than three times a week    Attends Religious Services: Never    Marine scientist or Organizations: No    Attends Archivist Meetings: Never    Marital Status: Married  Human resources officer Violence: Not At Risk (03/24/2022)   Humiliation, Afraid, Rape, and Kick questionnaire    Fear of  Current or Ex-Partner: No    Emotionally Abused: No  Physically Abused: No    Sexually Abused: No      Family History  Problem Relation Age of Onset   Alcohol abuse Father    Cirrhosis Father    Hypothyroidism Sister     Vitals:   06/07/22 1056  BP: 101/60  Pulse: (!) 110  SpO2: 94%  Weight: 172 lb (78 kg)   Wt Readings from Last 3 Encounters:  06/07/22 172 lb (78 kg)  05/23/22 164 lb (74.4 kg)  05/10/22 162 lb 8 oz (73.7 kg)     PHYSICAL EXAM: General: NAD Neck: JVP 8-9 cm, no thyromegaly or thyroid nodule.  Lungs: Clear to auscultation bilaterally with normal respiratory effort. CV: Nondisplaced PMI.  Heart mildly tachy, irregular S1/S2, no S3/S4, 3/6 SEM RUSB (can hear S2 clearly).  2+ edema 3/4 to knees bilaterally.  No carotid bruit.  Normal pedal pulses.  Abdomen: Soft, nontender, no hepatosplenomegaly, no distention.  Skin: Intact without lesions or rashes.  Neurologic: Alert and oriented x 3.  Psych: Normal affect. Extremities: No clubbing or cyanosis.  HEENT: Normal.   ASSESSMENT & PLAN:    1. Chronic HF with mid range EF -  02/2022 showed LVEF 55 to 60%, no wall motion abnormalities, normal RV systolic function, mild [name] Moderate AS - Volume status much better after Furoscix. Now euvolemic but remains NYHA IIIB - Unclear currently how much of his limitation related to HF vs AS or non-cardiac issues - On echo AS appears severe but gradients moderate  - TEE in 1/24 showed EF 40-45%, normal RV, ?low gradient moderate-severe aortic stenosis (visually severe but with mean gradient 13 mmHg).  - Weight is back up again by 10 lbs and he is volume overloaded on exam.  He has gone back into atrial fibrillation.  - Continue Toprol XL 25 mg bid - Increase Lasix to 40 mg bid.  BMET/BNP today, BMET in 10 days.  - SGLT2 inhibitor next appointment.  - Will try to get him back into NSR (see below), then will need evaluation for TAVR by structural heart team.   2.  Persistent A-fib - In AF with mild RVR, recurred about 1.5 wks post-DCCV in 1/24. Suspect this has worsened CHF.  - Continue amiodarone 200 bid.  Check LFTs and TSH today.  - Continue warfarin, check INR today.  - Now that he had more amiodarone loading, I will arrange for repeat DCCV next week.  Hopefully, with additional amiodarone, this will hold. He will get INR the day of procedure.  As long as this remains therapeutic, he should not need another TEE. We discussed risks/benefits and he agrees to procedure.    3. PVCs - Recent ZIO showed 17% PVC burden - Initially started on mexilitene 200 bid in 12/23 for PVC suppression.  - PVCs now suppressed but having AF and mexiletine has been stopped in favor of amiodarone.   4. Moderate aortic stenosis - AS moderate by echo gradients but visually severe.  - TEE in 1/24 showed moderate-severe AS (visually looked severe but mean gradient 13 mmHg, ?low flow/low gradient severe AS).  - Plan R/L cath when back in NSR for further evaluation of AS.  - If AS severe access for TAVR may be an issue   5. CKD stage 3 - monitor Scr with diuresis - BMET today and in 10 days with increased Lasix.   6. CAD s/p CABG and multiple PCIs - Grafts patent on cath 2022 - No current angina - Followed by Dr. Fletcher Anon  7. PAD - Significant PAD history with ongoing leg weakness with exertion that likely is his claudication-equivalent.  - He is on cilostazol.  This is not ideal with CHF but may be improving his claudication symptoms so I am reluctant to stop it right now.   Followup after DCCV.    Loralie Champagne, MD  06/07/22

## 2022-06-07 NOTE — Progress Notes (Signed)
ADVANCED HF CLINIC NOTE  Referring Physician: Darylene Price, NP  Primary Care: Owens Loffler, MD Primary Cardiologist: [name], MD HF Cardiologist: Dr. Haroldine Laws  HPI:  [name] is a 82 y.o. male with a history of AAA, carotid disease, CAD s/p CABG, CKD 3a, renal artery stenosis, COPD, intraventricular hemorrhage, moderate AS, PAF, PAD, DVT, subclavian artery stenosis, tobacco use and chronic heart failure.   Underwent CABG x 3 in 2013 with placement of a LIMA to LAD, vein graft to the RPDA, and vein graft to the obtuse marginal.  He suffered an intracranial hemorrhage in 2014 which was suspected to be due to malignant hypertension and dual antiplatelet therapy.  Shortly thereafter, he was diagnosed with extensive right-sided DVT and underwent IVC filter in the setting of contraindication for anticoagulation.    In 2018, he had extensive bilateral occlusive DVTs, which required catheter-based therapy and he has been on anticoagulation since then with warfarin.    EF was previously as low as 35 to 40% prior to his bypass surgery with subsequent improvement to 55 to 60% by echo in 2018.   Cath 11/22 and showed 3 out of 3 patent grafts with known occlusive left main and RCA disease.  Aortic stenosis was moderate by gradient 15 mmHg and valve area 1.1 cm.  Right heart catheterization was notable for elevated filling pressures and Lasix 20 mg daily was resumed.  Due to worsening claudication at follow-up, he underwent repeat ABIs which were abnormal at 0.61 on the right and 0.56 on the left.  Lower extremity angiogram was performed January 2023 which showed patent left renal artery stent with moderate right renal artery stenosis, patent bilateral iliac kissing stents extending into the distal aorta with mild restenosis, subtotal occlusion of the inferior branch of the right profunda with diffuse calcified SFA disease and collaterals from the profunda and three-vessel runoff below the knee.      Admitted 10/23 after a fall found to have right rib fracture, pneumothorax and subcutaneous emphysema.  He initially presented to [facility] but was transferred to [facility] to the trauma service.  He was initially quiring 15 L of O2.  CT showed contusion workup was unremarkable, they recommended conservative management as the patient slowly started to require less and less oxygen.  Hospitalization the patient had A-fib with RVR, rate controlled with Coreg.     Admitted 03/23/22 due to acute on chronic diastolic HF w/ significant volume overload. Cardiology consult obtained. Initially given IV lasix with transition to oral diuretics. Discharged after 3 days. Weight 179 on 12/1    Echo report from 03/24/22 showed an EF of 50-55% along with mild/moderate MR, moderate AS with severe calcification of aortic valve.   In 12/23, he had massive fluid overload.  Started on Furoscix x 4 days with 2 days of metolazone as well. He diuresed 15-20 pounds. BP low but no orthostasis. Edema resolved. We scheduled R/L cath (pressures only) to assess hemodynamics and AS but he cancelled.   Given ongoing atrial fibrillation, he had TEE-guided DCCV to NSR in 1/24.  The TEE showed EF 40-45%, normal RV, ?low gradient moderate-severe aortic stenosis (visually severe but with mean gradient 13 mmHg).   He returns for followup of CHF, CAD, atrial fibrillation.  He thinks he was in NSR for about 1.5 wks post-DCCV but has gone back into atrial fibrillation. Weight is up 10 lbs.  He says he still feels better than he did pre-cardioversion.  He is sleeping better.  Legs are chronically weak and "give out" after walking about 30 feet. He sleeps with the head of his bed raised. He is short of breath trying to walk in stores. No lightheadedness/syncope.  No chest pain.   ECG (personally reviewed): Atrial fibrillation rate 117, IVCD 138 msec  Labs (1/24): K 4.6, creatinine 1.59   Past Medical History:  Diagnosis Date    ([place]) heart failure with improved ejection fraction ([place])    a. 06/2011 Echo: EF 35-40%; b. 06/2012 Echo: EF 50%; c. 12/2016 Echo: EF 55-60%, no rwma, GRI DD, mild AS/MR, nl RV fxn, nl RVSP; d. 02/2021 Echo: EF 55%, GrII DD. Mild MR. Mild to mod TR. Sev AS by VTI.   AAA (abdominal aortic aneurysm) ([place])    a. 05/2020 Abd u/s: 3.6cm.   Aortic calcification (HCC) 05/01/2013   Bilateral renal artery stenosis ([place])    a. 06/2013 Angio/PTA: LRA: 95 (5x18 Herculink stent), RRA 60ost; b. 04/2021 Angio: mod RRA stenosis w/ patent LRA stent.   Carotid arterial disease ([place])    a. 05/2020 Carotid U/S: RICA 123456, LICA 123456   CHF (congestive heart failure) (HCC)    Chronic kidney disease    COPD (chronic obstructive pulmonary disease) (HCC)    Coronary artery disease    a. 2013 s/p CABG x 3 (LIMA->LAD, VG->OM, VG->RPDA; b. 06/2012 MV: no ischemia; c. 02/2021 Cath: LM 100, RCA 90ost, 157m free LIMA->LAD nl, VG->OM2 nl, VG->RPDA nl. Mod AS (mean grad 136mg, AVA 1.1cm^2). RHC w/ elev filling pressures.   History of prior cigarette smoking 04/11/2008   Qualifier: Diagnosis of  By: BeDiona BrownerD, Amy     Hyperlipidemia    Hypertension    Hypothyroidism    Intraventricular hemorrhage (HC[place]03/20/2014   Ischemic cardiomyopathy    a. 06/2011 Echo: EF 35-40%; b. 06/2012 Echo: EF 50%; c. 12/2016 Echo: EF 55-60%, no rwma, GRI DD; d. 02/2021 Echo: Ef 55%, GrII DD.   Moderate aortic stenosis    a. 02/2021 Echo: EF 55%, GrII DD. Sev Ca2+ of AoV. Sev AS w/ AVA by VTI 0.79cm^2; b. 02/2021 Cath: Mod AS w/ mean grad 1547m. AVA 1.1cm^2.   PAF (paroxysmal atrial fibrillation) ([place]  Peripheral arterial disease ([place]  a. Previous left lower extremity stenting by Dr. SanJamal Collinb. 12/2012 s/p bilat ostial common iliac stenting; c. 04/2021 Angio: Patent LRA stent, mod RRA stenosis. Small AAA. Sev plaque RSFA 2/ subtl occl of inf branch of profunda. Diff Ca2+ SFA dzs w/ collats from profunda and 3V runoff. Diffuse LSFA dzs w/  collats from profunda. Subtl PT dzs. 3V runoff-->Med Rx.   Right leg DVT ([place]CC[place]4/21/2014   Subclavian artery stenosis, left (HCCNew [place]2/2014   Status post stenting of the ostium and self-expanding stent placement to the left axillary artery   Venous insufficiency     Current Outpatient Medications  Medication Sig Dispense Refill   albuterol (VENTOLIN HFA) 108 (90 Base) MCG/ACT inhaler Inhale 2 puffs into the lungs every 6 (six) hours as needed for wheezing or shortness of breath. 18 g 0   amiodarone (PACERONE) 200 MG tablet Take 1 tablet (200 mg total) by mouth 2 (two) times daily. 60 tablet 3   atorvastatin (LIPITOR) 20 MG tablet TAKE 1 TABLET BY MOUTH EVERY DAY 30 tablet 5   augmented betamethasone dipropionate (DIPROLENE-AF) 0.05 % cream APPLY TO AFFECTED AREA TWICE A DAY     cilostazol (PLETAL) 50 MG tablet TAKE 1 TABLET BY MOUTH TWICE  A DAY 180 tablet 0   levothyroxine (SYNTHROID) 150 MCG tablet TAKE ONE TABLET BY MOUTH EVERY DAY BEFORE BREAKFAST 90 tablet 0   metoprolol succinate (TOPROL XL) 25 MG 24 hr tablet Take 1 tablet (25 mg total) by mouth 2 (two) times daily. 60 tablet 3   tamsulosin (FLOMAX) 0.4 MG CAPS capsule Take 1 capsule (0.4 mg total) by mouth daily after supper. 30 capsule 6   warfarin (COUMADIN) 5 MG tablet Take 2.5 mg by mouth daily. 1/2 tab daily except 1 tab on mon,wed,frid     furosemide (LASIX) 40 MG tablet Take 1 tablet (40 mg total) by mouth 2 (two) times daily. 60 tablet 3   No current facility-administered medications for this visit.    Allergies  Allergen Reactions   Plavix [Clopidogrel Bisulfate]     Brain hemorrhage prev while on plavix and aspirin      Social History   Socioeconomic History   Marital status: Married    Spouse name: Not on file   Number of children: 2   Years of education: Not on file   Highest education level: Not on file  Occupational History   Occupation: Retired    Fish farm manager: RETIRED    Comment: Land   Tobacco Use   Smoking status: Former    Packs/day: 1.00    Years: 60.00    Total pack years: 60.00    Types: Cigarettes    Quit date: 12/25/2010    Years since quitting: 11.4   Smokeless tobacco: Never  Vaping Use   Vaping Use: Never used  Substance and Sexual Activity   Alcohol use: No   Drug use: No   Sexual activity: Not Currently  Other Topics Concern   Not on file  Social History Narrative   Lives with wife, Joycelyn Schmid   Exercising 3 times a week.    Moderate diet control.   2 dogs   Social Determinants of Health   Financial Resource Strain: Low Risk  (07/05/2021)   Overall Financial Resource Strain (CARDIA)    Difficulty of Paying Living Expenses: Not very hard  Food Insecurity: No Food Insecurity (03/24/2022)   Hunger Vital Sign    Worried About Running Out of Food in the Last Year: Never true    Ran Out of Food in the Last Year: Never true  Transportation Needs: No Transportation Needs (03/24/2022)   PRAPARE - Hydrologist (Medical): No    Lack of Transportation (Non-Medical): No  Physical Activity: Insufficiently Active (07/05/2021)   Exercise Vital Sign    Days of Exercise per Week: 7 days    Minutes of Exercise per Session: 20 min  Stress: No Stress Concern Present (07/05/2021)   [place]    Feeling of Stress : Not at all  Social Connections: Moderately Isolated (07/05/2021)   Social Connection and Isolation Panel [NHANES]    Frequency of Communication with Friends and Family: More than three times a week    Frequency of Social Gatherings with Friends and Family: More than three times a week    Attends Religious Services: Never    Marine scientist or Organizations: No    Attends Archivist Meetings: Never    Marital Status: Married  Human resources officer Violence: Not At Risk (03/24/2022)   Humiliation, Afraid, Rape, and Kick questionnaire    Fear of  Current or Ex-Partner: No    Emotionally Abused: No  Physically Abused: No    Sexually Abused: No      Family History  Problem Relation Age of Onset   Alcohol abuse Father    Cirrhosis Father    Hypothyroidism Sister     Vitals:   06/07/22 1056  BP: 101/60  Pulse: (!) 110  SpO2: 94%  Weight: 172 lb (78 kg)   Wt Readings from Last 3 Encounters:  06/07/22 172 lb (78 kg)  05/23/22 164 lb (74.4 kg)  05/10/22 162 lb 8 oz (73.7 kg)     PHYSICAL EXAM: General: NAD Neck: JVP 8-9 cm, no thyromegaly or thyroid nodule.  Lungs: Clear to auscultation bilaterally with normal respiratory effort. CV: Nondisplaced PMI.  Heart mildly tachy, irregular S1/S2, no S3/S4, 3/6 SEM RUSB (can hear S2 clearly).  2+ edema 3/4 to knees bilaterally.  No carotid bruit.  Normal pedal pulses.  Abdomen: Soft, nontender, no hepatosplenomegaly, no distention.  Skin: Intact without lesions or rashes.  Neurologic: Alert and oriented x 3.  Psych: Normal affect. Extremities: No clubbing or cyanosis.  HEENT: Normal.   ASSESSMENT & PLAN:    1. Chronic HF with mid range EF -  02/2022 showed LVEF 55 to 60%, no wall motion abnormalities, normal RV systolic function, mild MR Moderate AS - Volume status much better after Furoscix. Now euvolemic but remains NYHA IIIB - Unclear currently how much of his limitation related to HF vs AS or non-cardiac issues - On echo AS appears severe but gradients moderate  - TEE in 1/24 showed EF 40-45%, normal RV, ?low gradient moderate-severe aortic stenosis (visually severe but with mean gradient 13 mmHg).  - Weight is back up again by 10 lbs and he is volume overloaded on exam.  He has gone back into atrial fibrillation.  - Continue Toprol XL 25 mg bid - Increase Lasix to 40 mg bid.  BMET/BNP today, BMET in 10 days.  - SGLT2 inhibitor next appointment.  - Will try to get him back into NSR (see below), then will need evaluation for TAVR by structural heart team.   2.  Persistent A-fib - In AF with mild RVR, recurred about 1.5 wks post-DCCV in 1/24. Suspect this has worsened CHF.  - Continue amiodarone 200 bid.  Check LFTs and TSH today.  - Continue warfarin, check INR today.  - Now that he had more amiodarone loading, I will arrange for repeat DCCV next week.  Hopefully, with additional amiodarone, this will hold. He will get INR the day of procedure.  As long as this remains therapeutic, he should not need another TEE. We discussed risks/benefits and he agrees to procedure.    3. PVCs - Recent ZIO showed 17% PVC burden - Initially started on mexilitene 200 bid in 12/23 for PVC suppression.  - PVCs now suppressed but having AF and mexiletine has been stopped in favor of amiodarone.   4. Moderate aortic stenosis - AS moderate by echo gradients but visually severe.  - TEE in 1/24 showed moderate-severe AS (visually looked severe but mean gradient 13 mmHg, ?low flow/low gradient severe AS).  - Plan R/L cath when back in NSR for further evaluation of AS.  - If AS severe access for TAVR may be an issue   5. CKD stage 3 - monitor Scr with diuresis - BMET today and in 10 days with increased Lasix.   6. CAD s/p CABG and multiple PCIs - Grafts patent on cath 2022 - No current angina - Followed by Dr. Fletcher Anon  7. PAD - Significant PAD history with ongoing leg weakness with exertion that likely is his claudication-equivalent.  - He is on cilostazol.  This is not ideal with CHF but may be improving his claudication symptoms so I am reluctant to stop it right now.   Followup after DCCV.    Loralie Champagne, MD  06/07/22

## 2022-06-07 NOTE — Telephone Encounter (Signed)
Pt cardioversion re-scheduled for Tuesday 2/20 at 0730 with Dr. Haroldine Laws.  Pt and spouse aware, agreeable, and verbalized understanding with plan.

## 2022-06-08 NOTE — Telephone Encounter (Signed)
Reviewing pt chart after cardiology OV for possible INR lab result. No lab result but pt has been scheduled for another cardioversion on 2/20.  Contacted pt, who reports they doubled lasix dosage but he is still retaining some fluid. Advised best to check INR. Scheduled pt for coumadin clinic for tomorrow at [facility]. Pt reports he may have transportation difficulties but will contact the office if he cannot make the apt. Pt denies any s/s of abnormal bruising or bleeding. Advised if any s/s to contact office or go to the ER. Pt verbalized understanding.

## 2022-06-09 ENCOUNTER — Telehealth: Payer: Self-pay

## 2022-06-09 ENCOUNTER — Ambulatory Visit (INDEPENDENT_AMBULATORY_CARE_PROVIDER_SITE_OTHER): Payer: Medicare Other

## 2022-06-09 DIAGNOSIS — Z7901 Long term (current) use of anticoagulants: Secondary | ICD-10-CM

## 2022-06-09 LAB — PROTIME-INR
INR: 6.1 ratio (ref 0.8–1.0)
Prothrombin Time: 60 s (ref 9.6–13.1)

## 2022-06-09 LAB — POCT INR: INR: 6.1 — AB (ref 2.0–3.0)

## 2022-06-09 NOTE — Patient Instructions (Addendum)
Pre visit review using our clinic review tool, if applicable. No additional management support is needed unless otherwise documented below in the visit note.  Hold all doses of warfarin until recheck on Monday, 2/19.

## 2022-06-09 NOTE — Telephone Encounter (Signed)
Advised pt lab INR was also 6.1 Continue instructions given in coumadin clinic this morning and hold all warfarin until recheck on Monday. Advised cardiology agreed with plan. Advised if anything changes to contact the office. Advised of ER precautions. Pt verbalized understanding.

## 2022-06-09 NOTE — Telephone Encounter (Signed)
Pt in office today for INR check, result is 6.1. Pt is scheduled for cardioversion on 2/20. Forwarding msg to cardiology to make aware of INR.

## 2022-06-09 NOTE — Telephone Encounter (Signed)
CRITICAL VALUE STICKER  CRITICAL VALUE: INR: 6.12 PT 60  RECEIVER (on-site recipient of call): Francella Solian, CMA   DATE & TIME NOTIFIED: 1:12 PM 06/09/22  MESSENGER (representative from lab): Santiago Glad   MD NOTIFIED: Randall An, RN was notified of results and informed will send phone note for her documentation  TIME OF NOTIFICATION: 1:13 PM 06/09/22   RESPONSE:  she will advise if any further action needed on our part. Results were documented in red book as well.

## 2022-06-09 NOTE — Progress Notes (Signed)
Pt scheduled for cardioversion on 2/20. Pt reports cardiology reported pt is holding a lot of fluid. Lasix dose was doubled.   Hold all doses of warfarin until recheck on Monday, 2/19. Will draw INR lab today and contact pt this afternoon with further instructions. Pt denies any abnormal bruising or bleeding. Advised pt to contact office or go to the ER if any s/s of abnormal bruising or bleeding.

## 2022-06-13 ENCOUNTER — Ambulatory Visit (INDEPENDENT_AMBULATORY_CARE_PROVIDER_SITE_OTHER): Payer: Medicare Other

## 2022-06-13 ENCOUNTER — Other Ambulatory Visit: Payer: Medicare Other

## 2022-06-13 DIAGNOSIS — Z7901 Long term (current) use of anticoagulants: Secondary | ICD-10-CM | POA: Diagnosis not present

## 2022-06-13 LAB — POCT INR: INR: 3 (ref 2.0–3.0)

## 2022-06-13 NOTE — Telephone Encounter (Signed)
Contacted pt and advised to restart normal dosing.  Will f/u with pt after cardioversion for next apt. Pt verbalized understanding.

## 2022-06-13 NOTE — Telephone Encounter (Signed)
Contacted pt and advised to hold warfarin dose today unless contacted by this nurse or cardiology advising to take it. Advised to follow instructions from cardiology tomorrow after cardioversion about restarting warfarin. Pt reports he is still very fatigued but he thinks most of that is from having to urinate every hour throughout the night. Pt reports weight today is 163 lb, down from last apt with cardiology which was 172 lb.  Advised if any changes to contact the coumadin clinic or cardiology. Pt verbalized understanding.

## 2022-06-13 NOTE — Patient Instructions (Signed)
Pre visit review using our clinic review tool, if applicable. No additional management support is needed unless otherwise documented below in the visit note. 

## 2022-06-13 NOTE — Telephone Encounter (Signed)
Please dose per coumadin clinic, can dose as you would at any other time to keep INR 2-3.

## 2022-06-13 NOTE — Anesthesia Preprocedure Evaluation (Signed)
Anesthesia Evaluation  Patient identified by MRN, date of birth, ID band Patient awake    Reviewed: Allergy & Precautions, H&P , NPO status , Patient's Chart, lab work & pertinent test results  Airway Mallampati: II  TM Distance: >3 FB Neck ROM: full    Dental no notable dental hx.    Pulmonary COPD, former smoker   Pulmonary exam normal        Cardiovascular Exercise Tolerance: Poor hypertension, + CAD, + CABG, + Peripheral Vascular Disease and +CHF  + dysrhythmias Atrial Fibrillation + Valvular Problems/Murmurs AS  Rhythm:Irregular Rate:Tachycardia + Systolic murmurs- Peripheral Edema ECHO 05/23/2022: IMPRESSIONS   1. Left ventricular ejection fraction, by estimation, is 40 to 45%. The  left ventricle has mildly decreased function.   2. Right ventricular systolic function is normal. The right ventricular  size is normal.   3. Left atrial size was moderately dilated. No left atrial/left atrial  appendage thrombus was detected.   4. Right atrial size was mildly dilated.   5. The mitral valve is normal in structure. Mild mitral valve  regurgitation.   6. Visually AS appears severe but by gradient measures in mild to  moderate range. Unable to planimeter AoV orifcie as it was very eccentric.  The aortic valve is tricuspid. There is severe calcifcation of the aortic  valve. Aortic valve regurgitation is  trivial. Moderate to severe aortic valve stenosis. Aortic valve mean  gradient measures 13.3 mmHg. Aortic valve Vmax measures 2.26 m/s.   7. There is Severe (Grade IV) plaque involving the descending aorta.   8. Successful DC-CV    Subclavian artery stenosis, left s/p stent  02/2021 Cath: LM 100, RCA 90ost, 166m free LIMA->LAD nl, VG->OM2 nl, VG->RPDA nl. Mod AS (mean grad 115mg, AVA 1.1cm^2). RHC w/ elev filling pressures.  Carotid arterial disease   Neuro/Psych negative neurological ROS  negative psych ROS    GI/Hepatic negative GI ROS, Neg liver ROS,,,  Endo/Other  Hypothyroidism    Renal/GU Renal InsufficiencyRenal diseaseBilateral renal artery stenosis with patent LRA stent  negative genitourinary   Musculoskeletal   Abdominal Normal abdominal exam  (+)   Peds  Hematology negative hematology ROS (+)   Anesthesia Other Findings Past Medical History: No date: (HFimpEF) heart failure with improved ejection fraction (HC[facility]    Comment:  a. 06/2011 Echo: EF 35-40%; b. 06/2012 Echo: EF 50%; c.               12/2016 Echo: EF 55-60%, no rwma, GRI DD, mild AS/MR, nl               RV fxn, nl RVSP; d. 02/2021 Echo: EF 55%, GrII DD. Mild               MR. Mild to mod TR. Sev AS by VTI. No date: AAA (abdominal aortic aneurysm) (HCLough[place]    Comment:  a. 05/2020 Abd u/s: 3.6cm. 05/01/2013: Aortic calcification (HCC) No date: Bilateral renal artery stenosis (HC[facility]    Comment:  a. 06/2013 Angio/PTA: LRA: 95 (5x18 Herculink stent), RRA              60ost; b. 04/2021 Angio: mod RRA stenosis w/ patent LRA               stent. No date: Carotid arterial disease (HCOceans[place]    Comment:  a. 05/2020 Carotid U/S: RICA [id number] date: CHF (congestive heart failure) (HCC) No date: Chronic kidney disease No date: COPD (  chronic obstructive pulmonary disease) (HCC) No date: Coronary artery disease     Comment:  a. 2013 s/p CABG x 3 (LIMA->LAD, VG->OM, VG->RPDA; b.               06/2012 MV: no ischemia; c. 02/2021 Cath: LM 100, RCA               90ost, 132m free LIMA->LAD nl, VG->OM2 nl, VG->RPDA nl.               Mod AS (mean grad 163mg, AVA 1.1cm^2). RHC w/ elev               filling pressures. 04/11/2008: History of prior cigarette smoking     Comment:  Qualifier: Diagnosis of  By: BeDiona BrownerD, Amy   No date: Hyperlipidemia No date: Hypertension No date: Hypothyroidism 07/12/2012: Intraventricular hemorrhage (HCIndian BeachNo date: Ischemic cardiomyopathy     Comment:  a. 06/2011 Echo: EF 35-40%; b. 06/2012 Echo:  EF 50%; c.               12/2016 Echo: EF 55-60%, no rwma, GRI DD; d. 02/2021 Echo:              Ef 55%, GrII DD. No date: Moderate aortic stenosis     Comment:  a. 02/2021 Echo: EF 55%, GrII DD. Sev Ca2+ of AoV. Sev               AS w/ AVA by VTI 0.79cm^2; b. 02/2021 Cath: Mod AS w/               mean grad 1590m. AVA 1.1cm^2. No date: PAF (paroxysmal atrial fibrillation) (HCC) No date: Peripheral arterial disease (HCCCrystal [place]   Comment:  a. Previous left lower extremity stenting by Dr. SanJamal Collin            b. 12/2012 s/p bilat ostial common iliac stenting; c.               04/2021 Angio: Patent LRA stent, mod RRA stenosis. Small               AAA. Sev plaque RSFA 2/ subtl occl of inf branch of               profunda. Diff Ca2+ SFA dzs w/ collats from profunda and               3V runoff. Diffuse LSFA dzs w/ collats from profunda.               Subtl PT dzs. 3V runoff-->Med Rx. 08/13/2012: Right leg DVT (HCCMonsey2/2014: Subclavian artery stenosis, left (HCC)     Comment:  Status post stenting of the ostium and self-expanding               stent placement to the left axillary artery No date: Venous insufficiency  Past Surgical History: 07/10/2013: ABDOMINAL ANGIOGRAM     Comment:  WITH BI-FEMORAL RUNOFF       DR ARIFletcher Anon/06/2012: ABDOMINAL AORTAGRAM; N/A     Comment:  Procedure: ABDOMINAL AORTAGRAM;  Surgeon: MuhWellington HampshireD;  [facility];  Service:               Cardiovascular;  Laterality: N/A; 07/10/2013: ABDOMINAL AORTAGRAM; N/A     Comment:  Procedure: ABDOMINAL AORTAGRAM;  Surgeon: MuhMertie Clause  Fletcher Anon, MD;  Location: [facility];  Service:               Cardiovascular;  Laterality: N/A; 05/12/2021: ABDOMINAL AORTOGRAM W/LOWER EXTREMITY; N/A     Comment:  Procedure: ABDOMINAL AORTOGRAM W/LOWER EXTREMITY;                Surgeon: Wellington Hampshire, MD;  Location: [place] [facility]              [facility];  Service: Cardiovascular;  Laterality: N/A; 05/2012:  ANGIOPLASTY / STENTING FEMORAL 12/26/2012: ANGIOPLASTY / STENTING ILIAC; Bilateral 06/20/2012: ARCH AORTOGRAM; N/A     Comment:  Procedure: ARCH AORTOGRAM;  Surgeon: Wellington Hampshire,               MD;  Location: [facility];  Service: Cardiovascular;                Laterality: N/A; 05/2012: CARDIAC CATHETERIZATION     Comment:  [place] 1990's: CHOLECYSTECTOMY 07/11/2011: CORONARY ARTERY BYPASS GRAFT     Comment:  Procedure: CORONARY ARTERY BYPASS GRAFTING (CABG);                Surgeon: Grace Isaac, MD;  Location: [place];                Service: Open Heart Surgery;  Laterality: N/A;  Times 3.               On Pump. Using right greater saphenous vein and left               internal mammary artery.  No date: CORONARY ARTERY BYPASS GRAFT No date: HERNIA REPAIR 12/26/2012: INSERTION OF ILIAC STENT; Bilateral     Comment:  Procedure: INSERTION OF ILIAC STENT;  Surgeon: Wellington Hampshire, MD;  Location: [facility];  Service:               Cardiovascular;  Laterality: Bilateral;  Bilateral Common              Iliac Artery 07/07/2011: LEFT AND RIGHT HEART CATHETERIZATION WITH CORONARY  ANGIOGRAM     Comment:  Procedure: LEFT AND RIGHT HEART CATHETERIZATION WITH               CORONARY ANGIOGRAM;  Surgeon: Minna Merritts, MD;                Location: [facility];  Service: Cardiovascular;; 12/30/2016: LOWER EXTREMITY VENOGRAPHY; Bilateral     Comment:  Procedure: Lower Extremity Venography;  Surgeon:               Katha Cabal, MD;  Location: [place] [facility];               Service: Cardiovascular;  Laterality: Bilateral; 06/20/2012: PERCUTANEOUS STENT INTERVENTION     Comment:  Procedure: PERCUTANEOUS STENT INTERVENTION;  Surgeon:               Wellington Hampshire, MD;  Location: [facility];  Service:               Cardiovascular;; 07/10/2013: RENAL ARTERY ANGIOPLASTY; Left     Comment:  DR Fletcher Anon 03/15/2021: RIGHT/LEFT HEART CATH AND CORONARY/GRAFT ANGIOGRAPHY;  N/A     Comment:  Procedure: RIGHT/LEFT HEART CATH AND CORONARY/GRAFT               ANGIOGRAPHY;  Surgeon: Wellington Hampshire, MD;  Location:               [place] [facility];  Service: Cardiovascular;                Laterality: N/A; 05/2012: SUBCLAVIAN ARTERY STENT     Comment:  "2 stents" (12/26/2012) Spring 2017: TOOTH EXTRACTION     Comment:  mulitple (6) 07/2012: VENA CAVA FILTER PLACEMENT     Comment:  Removable     Reproductive/Obstetrics negative OB ROS                              Anesthesia Physical Anesthesia Plan  ASA: 3  Anesthesia Plan: General   Post-op Pain Management:    Induction: Intravenous  PONV Risk Score and Plan: Propofol infusion and TIVA  Airway Management Planned: Natural Airway  Additional Equipment:   Intra-op Plan:   Post-operative Plan:   Informed Consent:      Dental Advisory Given  Plan Discussed with: CRNA and Surgeon  Anesthesia Plan Comments:          Anesthesia Quick Evaluation

## 2022-06-13 NOTE — Progress Notes (Signed)
Pt had OV with cardiology last week and has been retaining fluid, Lasix dose was doubled. Pt is scheduled for cardioversion tomorrow.  Will send another msg to cardiology to advise of INR today. Cardiology note was sent last week when pt had 6.1 INR. Pt was in clinic today for POCT INR.  Hold dose today and then continue 1/2 tablet daily except take 1 tablet on Mondays. Concern for continued fluid retention since INR did not come down as much as expected. Recheck in 2 weeks.  Sent msg to cardiology referencing INR for today.

## 2022-06-13 NOTE — Telephone Encounter (Addendum)
INR today 3.0    Would like to hold dose today also due to continued concern for fluid retention since INR did not drop as much as expected.   Forwarding msg to cardiology.

## 2022-06-14 ENCOUNTER — Encounter: Payer: Self-pay | Admitting: Internal Medicine

## 2022-06-14 ENCOUNTER — Ambulatory Visit: Payer: Medicare Other | Admitting: Anesthesiology

## 2022-06-14 ENCOUNTER — Ambulatory Visit
Admission: RE | Admit: 2022-06-14 | Discharge: 2022-06-14 | Disposition: A | Discharge status: Home / Self Care | Payer: Medicare Other | Attending: Internal Medicine | Admitting: Internal Medicine

## 2022-06-14 ENCOUNTER — Encounter
Admission: RE | Disposition: A | Discharge status: Home / Self Care | Payer: Self-pay | Source: Home / Self Care | Attending: Internal Medicine

## 2022-06-14 DIAGNOSIS — I5032 Chronic diastolic (congestive) heart failure: Secondary | ICD-10-CM | POA: Insufficient documentation

## 2022-06-14 DIAGNOSIS — I080 Rheumatic disorders of both mitral and aortic valves: Secondary | ICD-10-CM | POA: Insufficient documentation

## 2022-06-14 DIAGNOSIS — Z87891 Personal history of nicotine dependence: Secondary | ICD-10-CM | POA: Insufficient documentation

## 2022-06-14 DIAGNOSIS — I2510 Atherosclerotic heart disease of native coronary artery without angina pectoris: Secondary | ICD-10-CM | POA: Insufficient documentation

## 2022-06-14 DIAGNOSIS — I130 Hypertensive heart and chronic kidney disease with heart failure and stage 1 through stage 4 chronic kidney disease, or unspecified chronic kidney disease: Secondary | ICD-10-CM | POA: Diagnosis not present

## 2022-06-14 DIAGNOSIS — I4819 Other persistent atrial fibrillation: Secondary | ICD-10-CM | POA: Diagnosis not present

## 2022-06-14 DIAGNOSIS — E039 Hypothyroidism, unspecified: Secondary | ICD-10-CM | POA: Diagnosis not present

## 2022-06-14 DIAGNOSIS — J449 Chronic obstructive pulmonary disease, unspecified: Secondary | ICD-10-CM | POA: Diagnosis not present

## 2022-06-14 DIAGNOSIS — I4892 Unspecified atrial flutter: Secondary | ICD-10-CM | POA: Insufficient documentation

## 2022-06-14 DIAGNOSIS — E785 Hyperlipidemia, unspecified: Secondary | ICD-10-CM | POA: Diagnosis not present

## 2022-06-14 DIAGNOSIS — Z86718 Personal history of other venous thrombosis and embolism: Secondary | ICD-10-CM | POA: Insufficient documentation

## 2022-06-14 DIAGNOSIS — I4891 Unspecified atrial fibrillation: Secondary | ICD-10-CM

## 2022-06-14 DIAGNOSIS — I701 Atherosclerosis of renal artery: Secondary | ICD-10-CM | POA: Diagnosis not present

## 2022-06-14 DIAGNOSIS — Z951 Presence of aortocoronary bypass graft: Secondary | ICD-10-CM | POA: Insufficient documentation

## 2022-06-14 DIAGNOSIS — I739 Peripheral vascular disease, unspecified: Secondary | ICD-10-CM | POA: Insufficient documentation

## 2022-06-14 DIAGNOSIS — N1831 Chronic kidney disease, stage 3a: Secondary | ICD-10-CM | POA: Diagnosis not present

## 2022-06-14 DIAGNOSIS — I509 Heart failure, unspecified: Secondary | ICD-10-CM | POA: Diagnosis not present

## 2022-06-14 HISTORY — PX: CARDIOVERSION: SHX1299

## 2022-06-14 SURGERY — CARDIOVERSION
Anesthesia: General

## 2022-06-14 MED ORDER — EPHEDRINE 5 MG/ML INJ
INTRAVENOUS | Status: AC
  Filled 2022-06-14: qty 5

## 2022-06-14 MED ORDER — PHENYLEPHRINE 80 MCG/ML (10ML) SYRINGE FOR IV PUSH (FOR BLOOD PRESSURE SUPPORT)
PREFILLED_SYRINGE | INTRAVENOUS | Status: AC
  Filled 2022-06-14: qty 10

## 2022-06-14 MED ORDER — PROPOFOL 10 MG/ML IV BOLUS
INTRAVENOUS | Status: AC
  Filled 2022-06-14: qty 20

## 2022-06-14 MED ORDER — EPHEDRINE SULFATE (PRESSORS) 50 MG/ML IJ SOLN
INTRAMUSCULAR | Status: DC | PRN
  Administered 2022-06-14: 5 mg via INTRAVENOUS

## 2022-06-14 MED ORDER — SODIUM CHLORIDE 0.9 % IV SOLN: INTRAVENOUS | Status: DC

## 2022-06-14 MED ORDER — PHENYLEPHRINE 80 MCG/ML (10ML) SYRINGE FOR IV PUSH (FOR BLOOD PRESSURE SUPPORT)
PREFILLED_SYRINGE | INTRAVENOUS | Status: DC | PRN
  Administered 2022-06-14 (×3): 160 ug via INTRAVENOUS

## 2022-06-14 MED ORDER — PROPOFOL 10 MG/ML IV BOLUS
INTRAVENOUS | Status: DC | PRN
  Administered 2022-06-14: 40 mg via INTRAVENOUS

## 2022-06-14 NOTE — Transfer of Care (Signed)
Immediate Anesthesia Transfer of Care Note  Patient: [name]  Procedure(s) Performed: CARDIOVERSION  Patient Location: Nursing Unit  Anesthesia Type:General  Level of Consciousness: awake, alert , and oriented  Airway & Oxygen Therapy: Patient Spontanous Breathing and Patient connected to nasal cannula oxygen  Post-op Assessment: Report given to RN and Post -op Vital signs reviewed and stable  Post vital signs: Reviewed and stable  Last Vitals:  Vitals Value Taken Time  BP 97/57 06/14/22 0742  Temp    Pulse 78 06/14/22 0743  Resp 23 06/14/22 0743  SpO2 95 % 06/14/22 0743    Last Pain:  Vitals:   06/14/22 0707  TempSrc: Oral  PainSc: 0-No pain         Complications: No notable events documented.

## 2022-06-14 NOTE — Interval H&P Note (Signed)
History and Physical Interval Note:  06/14/2022 7:34 AM  [name]  has presented today for surgery, with the diagnosis of Cardioversion   Afib.  The various methods of treatment have been discussed with the patient and family. After consideration of risks, benefits and other options for treatment, the patient has consented to  Procedure(s): CARDIOVERSION (N/A) as a surgical intervention.  The patient's history has been reviewed, patient examined, no change in status, stable for surgery.  I have reviewed the patient's chart and labs.  Questions were answered to the patient's satisfaction.     [name]

## 2022-06-14 NOTE — Anesthesia Postprocedure Evaluation (Signed)
Anesthesia Post Note  Patient: [name]  Procedure(s) Performed: CARDIOVERSION  Patient location during evaluation: PACU Anesthesia Type: General Level of consciousness: awake and alert Pain management: pain level controlled Vital Signs Assessment: post-procedure vital signs reviewed and stable Respiratory status: spontaneous breathing, nonlabored ventilation and respiratory function stable Cardiovascular status: blood pressure returned to baseline and stable Postop Assessment: no apparent nausea or vomiting Anesthetic complications: no   No notable events documented.   Last Vitals:  Vitals:   06/14/22 0800 06/14/22 0815  BP: (!) 89/61 (!) 99/59  Pulse: 83 76  Resp: 19 16  Temp:    SpO2: 96% 92%    Last Pain:  Vitals:   06/14/22 0815  TempSrc:   PainSc: 0-No pain                 [name]

## 2022-06-14 NOTE — CV Procedure (Signed)
    DIRECT CURRENT CARDIOVERSION  NAME:  [name]   MRN: [id number] DOB:  Oct 13, 1940   ADMIT DATE: 06/14/2022   INDICATIONS: Atrial fibrillation/flutter   PROCEDURE:   Informed consent was obtained prior to the procedure. The risks, benefits and alternatives for the procedure were discussed and the patient comprehended these risks. Once an appropriate time out was taken, the patient had the defibrillator pads placed in the anterior and posterior position. The patient then underwent sedation by the anesthesia service. Once an appropriate level of sedation was achieved, the patient received a single biphasic, synchronized 150J shock with prompt conversion to sinus rhythm. No apparent complications.  Glori Bickers, MD  7:38 AM

## 2022-06-14 NOTE — Anesthesia Procedure Notes (Signed)
Date/Time: 06/14/2022 7:39 AM  Performed by: Lily Peer, Brynda Heick, CRNAPre-anesthesia Checklist: Patient identified, Emergency Drugs available, Suction available, Patient being monitored and Timeout performed Patient Re-evaluated:Patient Re-evaluated prior to induction Oxygen Delivery Method: Nasal cannula Induction Type: IV induction

## 2022-06-17 ENCOUNTER — Telehealth: Payer: Self-pay

## 2022-06-17 NOTE — Telephone Encounter (Signed)
Contacted pt to inquire how he is doing after cardioversion on 2/20. Pt reports he is doing well but has been tired since the procedure. He reports he is to have INR checked on 2/26 at the CHF clinic. Advised this nurse will look for result and call him on 2/26. Advised if any changes to contact coumadin clinic, PCP or cardiology. Pt verbalized understanding.

## 2022-06-18 ENCOUNTER — Other Ambulatory Visit: Payer: Self-pay | Admitting: Cardiovascular Disease

## 2022-06-20 ENCOUNTER — Other Ambulatory Visit (HOSPITAL_COMMUNITY): Payer: Self-pay

## 2022-06-20 ENCOUNTER — Encounter: Payer: Self-pay | Admitting: Cardiology

## 2022-06-20 ENCOUNTER — Ambulatory Visit (INDEPENDENT_AMBULATORY_CARE_PROVIDER_SITE_OTHER): Payer: Medicare Other

## 2022-06-20 ENCOUNTER — Other Ambulatory Visit
Admission: RE | Admit: 2022-06-20 | Discharge: 2022-06-20 | Disposition: A | Discharge status: Home / Self Care | Payer: Medicare Other | Source: Ambulatory Visit | Attending: Cardiology | Admitting: Cardiology

## 2022-06-20 ENCOUNTER — Other Ambulatory Visit: Payer: Self-pay

## 2022-06-20 ENCOUNTER — Ambulatory Visit (HOSPITAL_BASED_OUTPATIENT_CLINIC_OR_DEPARTMENT_OTHER): Payer: Medicare Other | Admitting: Cardiology

## 2022-06-20 VITALS — BP 120/52 | HR 76 | Wt 166.2 lb

## 2022-06-20 DIAGNOSIS — N183 Chronic kidney disease, stage 3 unspecified: Secondary | ICD-10-CM

## 2022-06-20 DIAGNOSIS — Z7901 Long term (current) use of anticoagulants: Secondary | ICD-10-CM

## 2022-06-20 DIAGNOSIS — I350 Nonrheumatic aortic (valve) stenosis: Secondary | ICD-10-CM | POA: Diagnosis not present

## 2022-06-20 DIAGNOSIS — I5032 Chronic diastolic (congestive) heart failure: Secondary | ICD-10-CM | POA: Insufficient documentation

## 2022-06-20 DIAGNOSIS — I4819 Other persistent atrial fibrillation: Secondary | ICD-10-CM | POA: Insufficient documentation

## 2022-06-20 LAB — BASIC METABOLIC PANEL
Anion gap: 10 (ref 5–15)
BUN: 25 mg/dL — ABNORMAL HIGH (ref 8–23)
CO2: 29 mmol/L (ref 22–32)
Calcium: 8.6 mg/dL — ABNORMAL LOW (ref 8.9–10.3)
Chloride: 102 mmol/L (ref 98–111)
Creatinine, Ser: 1.63 mg/dL — ABNORMAL HIGH (ref 0.61–1.24)
GFR, Estimated: 42 mL/min — ABNORMAL LOW (ref >60)
Glucose, Bld: 81 mg/dL (ref 70–99)
Potassium: 3.7 mmol/L (ref 3.5–5.1)
Sodium: 141 mmol/L (ref 135–145)

## 2022-06-20 LAB — PROTIME-INR
INR: 2.6 — ABNORMAL HIGH (ref 0.8–1.2)
Prothrombin Time: 27.9 seconds — ABNORMAL HIGH (ref 11.4–15.2)

## 2022-06-20 LAB — BRAIN NATRIURETIC PEPTIDE: B Natriuretic Peptide: 585.2 pg/mL — ABNORMAL HIGH (ref 0.0–100.0)

## 2022-06-20 MED ORDER — EMPAGLIFLOZIN 10 MG PO TABS
10.0000 mg | ORAL_TABLET | Freq: Every day | ORAL | 3 refills | Status: DC
  Filled 2022-06-20 – 2022-06-24 (×2): qty 30, 30d supply, fill #0

## 2022-06-20 NOTE — Patient Instructions (Addendum)
Pre visit review using our clinic review tool, if applicable. No additional management support is needed unless otherwise documented below in the visit note.  Continue 1/2 tablet daily except take 1 tablet on Mondays. Recheck in 4 weeks. Please contact coumadin clinic if any changes.

## 2022-06-20 NOTE — Progress Notes (Signed)
Pt had OV with cardiology CHF clinic today and asked to have INR checked. Lab INR today is 2.6. Continue 1/2 tablet daily except take 1 tablet on Mondays. Recheck in 4 weeks. Please contact coumadin clinic if any changes.  Contacted his wife, Joycelyn Schmid, by phone. Pt was napping. Advised of warfarin dosing. [name] verbalized understanding.

## 2022-06-20 NOTE — Progress Notes (Signed)
  Medication Samples have been provided to the patient.  Drug name: Jardiance       Strength: 10 mg        Qty: 2  [id number]  Exp.Date: Jan/2026  Dosing instructions: take 10 mg (one tablet) daily before breakfast.  The patient has been instructed regarding the correct time, dose, and frequency of taking this medication, including desired effects and most common side effects.   Julianne Handler 1:39 PM 06/20/2022

## 2022-06-20 NOTE — Patient Instructions (Addendum)
Medication Changes:  START taking Jardiance '10mg'$  (one tablet) daily.    Lab Work:  Labs done today, your results will be available in MyChart, we will contact you for abnormal readings.   Testing/Procedures:  Your physician has recommended that you have a pulmonary function test. Pulmonary Function Tests are a group of tests that measure how well air moves in and out of your lungs.  They will call you to schedule this appointment.    Special Instructions // Education:  Do the following things EVERYDAY: Weigh yourself in the morning before breakfast. Write it down and keep it in a log. Take your medicines as prescribed Eat low salt foods--Limit salt (sodium) to 2000 mg per day.  Stay as active as you can everyday Limit all fluids for the day to less than 2 liters    Follow-Up in: you will have a follow up with Dr. Haroldine Laws in 6 weeks.  Please call our office to schedule you appointment.    If you have any questions or concerns before your next appointment please send [place] a message through [place] or call our office at [phone] Monday-Friday 8 am-5 pm.   If you have an urgent need after hours on the weekend please call your Primary Cardiologist or the [facility] in [place] at [phone].

## 2022-06-20 NOTE — Progress Notes (Signed)
ADVANCED HF CLINIC NOTE  Referring Physician: Darylene Price, NP  Primary Care: [name] Loffler, MD Primary Cardiologist: [name] Sacramento, MD HF Cardiologist: Dr. Haroldine [name]  HPI:  [name] is a 82 y.o. male with a history of AAA, carotid disease, CAD s/p CABG, CKD 3a, renal artery stenosis, COPD, intraventricular hemorrhage, moderate AS, PAF, PAD, DVT, subclavian artery stenosis, tobacco use and chronic heart failure.   Underwent CABG x 3 in 2013 with placement of a LIMA to LAD, vein graft to the RPDA, and vein graft to the obtuse marginal.  He suffered an intracranial hemorrhage in 2014 which was suspected to be due to malignant hypertension and dual antiplatelet therapy.  Shortly thereafter, he was diagnosed with extensive right-sided DVT and underwent IVC filter in the setting of contraindication for anticoagulation.    In 2018, he had extensive bilateral occlusive DVTs, which required catheter-based therapy and he has been on anticoagulation since then with warfarin.    EF was previously as low as 35 to 40% prior to his bypass surgery with subsequent improvement to 55 to 60% by echo in 2018.   Cath 11/22 and showed 3 out of 3 patent grafts with known occlusive left main and RCA disease.  Aortic stenosis was moderate by gradient 15 mmHg and valve area 1.1 cm.  Right heart catheterization was notable for elevated filling pressures and Lasix 20 mg daily was resumed.  Due to worsening claudication at follow-up, he underwent repeat ABIs which were abnormal at 0.61 on the right and 0.56 on the left.  Lower extremity angiogram was performed January 2023 which showed patent left renal artery stent with moderate right renal artery stenosis, patent bilateral iliac kissing stents extending into the distal aorta with mild restenosis, subtotal occlusion of the inferior branch of the right profunda with diffuse calcified SFA disease and collaterals from the profunda and three-vessel runoff below the knee.      Admitted 10/23 after a fall found to have right rib fracture, pneumothorax and subcutaneous emphysema.  He initially presented to [facility] but was transferred to [facility] to the trauma service.  He was initially quiring 15 L of O2.  CT showed contusion workup was unremarkable, they recommended conservative management as the patient slowly started to require less and less oxygen.  Hospitalization the patient had A-fib with RVR, rate controlled with Coreg.     Admitted 03/23/22 due to acute on chronic diastolic HF w/ significant volume overload. Cardiology consult obtained. Initially given IV lasix with transition to oral diuretics. Discharged after 3 days. Weight 179 on 12/1    Echo report from 03/24/22 showed an EF of 50-55% along with mild/moderate MR, moderate AS with severe calcification of aortic valve.   In 12/23, he had massive fluid overload.  Started on Furoscix x 4 days with 2 days of metolazone as well. He diuresed 15-20 pounds. BP low but no orthostasis. Edema resolved. We scheduled R/L cath (pressures only) to assess hemodynamics and AS but he cancelled.   Given ongoing atrial fibrillation, he had TEE-guided DCCV to NSR in 1/24.  The TEE showed EF 40-45%, normal RV, ?low gradient moderate-severe aortic stenosis (visually severe but with mean gradient 13 mmHg).   Interval hx: Since his cardioversion last week, he reports feeling very fatigued for several days which is now improving. He becomes very SOB after walking > 15-37f with mild lightheadedness. These symptoms are exacerbated when leaning over. Otherwise, he reports compliance with medications and states that his weight has remained  stable on his current diuretic regimen.    Past Medical History:  Diagnosis Date   ([place]) heart failure with improved ejection fraction ([place])    a. 06/2011 Echo: EF 35-40%; b. 06/2012 Echo: EF 50%; c. 12/2016 Echo: EF 55-60%, no rwma, GRI DD, mild AS/MR, nl RV fxn, nl RVSP; d. 02/2021 Echo: EF  55%, GrII DD. Mild MR. Mild to mod TR. Sev AS by VTI.   AAA (abdominal aortic aneurysm) ([place])    a. 05/2020 Abd u/s: 3.6cm.   Aortic calcification (HCC) 05/01/2013   Bilateral renal artery stenosis ([place])    a. 06/2013 Angio/PTA: LRA: 95 (5x18 Herculink stent), RRA 60ost; b. 04/2021 Angio: mod RRA stenosis w/ patent LRA stent.   Carotid arterial disease ([place])    a. 05/2020 Carotid U/S: RICA [id number], LICA 123456   CHF (congestive heart failure) (HCC)    Chronic kidney disease    COPD (chronic obstructive pulmonary disease) (HCC)    Coronary artery disease    a. 2013 s/p CABG x 3 (LIMA->LAD, VG->OM, VG->RPDA; b. 06/2012 MV: no ischemia; c. 02/2021 Cath: LM 100, RCA 90ost, 119m free LIMA->LAD nl, VG->OM2 nl, VG->RPDA nl. Mod AS (mean grad 126mg, AVA 1.1cm^2). RHC w/ elev filling pressures.   History of prior cigarette smoking 04/11/2008   Qualifier: Diagnosis of  By: BeDiona BrownerD, Amy     Hyperlipidemia    Hypertension    Hypothyroidism    Intraventricular hemorrhage ([facility]03/20/2014   Ischemic cardiomyopathy    a. 06/2011 Echo: EF 35-40%; b. 06/2012 Echo: EF 50%; c. 12/2016 Echo: EF 55-60%, no rwma, GRI DD; d. 02/2021 Echo: Ef 55%, GrII DD.   Moderate aortic stenosis    a. 02/2021 Echo: EF 55%, GrII DD. Sev Ca2+ of AoV. Sev AS w/ AVA by VTI 0.79cm^2; b. 02/2021 Cath: Mod AS w/ mean grad 1528m. AVA 1.1cm^2.   PAF (paroxysmal atrial fibrillation) ([place]  Peripheral arterial disease ([place]  a. Previous left lower extremity stenting by Dr. SanJamal Collinb. 12/2012 s/p bilat ostial common iliac stenting; c. 04/2021 Angio: Patent LRA stent, mod RRA stenosis. Small AAA. Sev plaque RSFA 2/ subtl occl of inf branch of profunda. Diff Ca2+ SFA dzs w/ collats from profunda and 3V runoff. Diffuse LSFA dzs w/ collats from profunda. Subtl PT dzs. 3V runoff-->Med Rx.   Right leg DVT ([place]CCGly[place]4/21/2014   Subclavian artery stenosis, left ([place]CC[place]2/2014   Status post stenting of the ostium and self-expanding stent placement to  the left axillary artery   Venous insufficiency     Current Outpatient Medications  Medication Sig Dispense Refill   albuterol (VENTOLIN HFA) 108 (90 Base) MCG/ACT inhaler Inhale 2 puffs into the lungs every 6 (six) hours as needed for wheezing or shortness of breath. 18 g 0   amiodarone (PACERONE) 200 MG tablet Take 1 tablet (200 mg total) by mouth 2 (two) times daily. 60 tablet 3   atorvastatin (LIPITOR) 20 MG tablet TAKE 1 TABLET BY MOUTH EVERY DAY 30 tablet 5   augmented betamethasone dipropionate (DIPROLENE-AF) 0.05 % cream APPLY TO AFFECTED AREA TWICE A DAY     cilostazol (PLETAL) 50 MG tablet TAKE 1 TABLET BY MOUTH TWICE A DAY 180 tablet 0   furosemide (LASIX) 40 MG tablet Take 1 tablet (40 mg total) by mouth 2 (two) times daily. 60 tablet 3   levothyroxine (SYNTHROID) 150 MCG tablet TAKE ONE TABLET BY MOUTH EVERY DAY BEFORE BREAKFAST 90 tablet 0   metoprolol  succinate (TOPROL XL) 25 MG 24 hr tablet Take 1 tablet (25 mg total) by mouth 2 (two) times daily. 60 tablet 3   tamsulosin (FLOMAX) 0.4 MG CAPS capsule Take 1 capsule (0.4 mg total) by mouth daily after supper. 30 capsule 6   warfarin (COUMADIN) 5 MG tablet Take 2.5 mg by mouth daily. 1/2 tab daily except 1 tab on mon,wed,frid     No current facility-administered medications for this visit.    Allergies  Allergen Reactions   Plavix [Clopidogrel Bisulfate]     Brain hemorrhage prev while on plavix and aspirin      Social History   Socioeconomic History   Marital status: Married    Spouse name: Not on file   Number of children: 2   Years of education: Not on file   Highest education level: Not on file  Occupational History   Occupation: Retired    Fish farm manager: RETIRED    Comment: Land  Tobacco Use   Smoking status: Former    Packs/day: 1.00    Years: 60.00    Total pack years: 60.00    Types: Cigarettes    Quit date: 12/25/2010    Years since quitting: 11.4   Smokeless tobacco: Never  Vaping Use    Vaping Use: Never used  Substance and Sexual Activity   Alcohol use: No   Drug use: No   Sexual activity: Not Currently  Other Topics Concern   Not on file  Social History Narrative   Lives with wife, Joycelyn Schmid   Exercising 3 times a week.    Moderate diet control.   2 dogs   Social Determinants of Health   Financial Resource Strain: Low Risk  (07/05/2021)   Overall Financial Resource Strain (CARDIA)    Difficulty of Paying Living Expenses: Not very hard  Food Insecurity: No Food Insecurity (03/24/2022)   Hunger Vital Sign    Worried About Running Out of Food in the Last Year: Never true    Ran Out of Food in the Last Year: Never true  Transportation Needs: No Transportation Needs (03/24/2022)   PRAPARE - Hydrologist (Medical): No    Lack of Transportation (Non-Medical): No  Physical Activity: Insufficiently Active (07/05/2021)   Exercise Vital Sign    Days of Exercise per Week: 7 days    Minutes of Exercise per Session: 20 min  Stress: No Stress Concern Present (07/05/2021)   [employer]    Feeling of Stress : Not at all  Social Connections: Moderately Isolated (07/05/2021)   Social Connection and Isolation Panel [NHANES]    Frequency of Communication with Friends and Family: More than three times a week    Frequency of Social Gatherings with Friends and Family: More than three times a week    Attends Religious Services: Never    Marine scientist or Organizations: No    Attends Archivist Meetings: Never    Marital Status: Married  Human resources officer Violence: Not At Risk (03/24/2022)   Humiliation, Afraid, Rape, and Kick questionnaire    Fear of Current or Ex-Partner: No    Emotionally Abused: No    Physically Abused: No    Sexually Abused: No      Family History  Problem Relation Age of Onset   Alcohol abuse Father    Cirrhosis Father    Hypothyroidism Sister      There were no vitals filed  for this visit.  Wt Readings from Last 3 Encounters:  06/14/22 163 lb (73.9 kg)  06/07/22 172 lb (78 kg)  05/23/22 164 lb (74.4 kg)     PHYSICAL EXAM: General: NAD Neck: JVP 9 Lungs: CTA B/L CV: Nondisplaced PMI.  Heart mildly tachy, irregular S1/S2, no S3/S4, 3/6 SEM RUSB (can hear S2 clearly).  1-2+ pretibial edema; warm to tuoch.  Abdomen: Soft, nontender, no hepatosplenomegaly, no distention.  Skin: Intact without lesions or rashes.  Neurologic: Alert and oriented x 3.  Psych: Normal affect. Extremities: no cyanosis; warm to touch with edema.  HEENT: normal.   ASSESSMENT & PLAN:    1. Chronic HF with mid range EF -  02/2022 showed LVEF 55 to 60%, no wall motion abnormalities, normal RV systolic function, mild MR Moderate AS - Volume status much better after Furoscix. Now euvolemic but remains NYHA IIIB - Unclear currently how much of his limitation related to HF vs AS or non-cardiac issues - On echo AS appears severe but gradients moderate  - TEE in 1/24 showed EF 40-45%, normal RV, ?low gradient moderate-severe aortic stenosis (visually severe but with mean gradient 13 mmHg).  - Continue Toprol XL 25 mg bid - Currently taking lasix '40mg'$  BID; appears mildly hypervolemic on exam; reports stable weights at home. Labs / ReDS pending.  - Start jardiance '10mg'$ ; he does not have insurance, starting patient assistance.   2. Persistent A-fib - Continue amiodarone 200 bid.   - Continue warfarin, repeat INR pending.  - S/P DCCV on 06/14/22, EKG today (06/20/22) w/ NSR.   3. PVCs - Recent ZIO showed 17% PVC burden - Initially started on mexilitene 200 bid in 12/23 for PVC suppression.  - PVCs now suppressed but having AF and mexiletine has been stopped in favor of amiodarone.   4. Moderate aortic stenosis - AS moderate by echo gradients but visually severe.  - TEE in 1/24 showed moderate-severe AS (visually looked severe but mean gradient 13 mmHg,  ?low flow/low gradient severe AS).  - Plan R/L cath when back in NSR for further evaluation of AS.  - If AS severe access for TAVR may be an issue   5. CKD stage 3 - monitor Scr with diuresis - Repeat BMP today, start empaglifozin.   6. CAD s/p CABG and multiple PCIs - Grafts patent on cath 2022 - No current angina - Followed by Dr. Fletcher Anon  7. PAD - Significant PAD history with ongoing leg weakness with exertion that likely is his claudication-equivalent.  - He is on cilostazol.  This is not ideal with CHF but may be improving his claudication symptoms so I am reluctant to stop it right now.    Hebert Soho, DO  06/20/22

## 2022-06-20 NOTE — Progress Notes (Signed)
   06/20/22 1046  ReDS Vest / Clip  Station Marker C  Ruler Value 30  ReDS Value Range < 36  ReDS Actual Value 34

## 2022-06-22 ENCOUNTER — Other Ambulatory Visit: Payer: Self-pay

## 2022-06-23 ENCOUNTER — Other Ambulatory Visit: Payer: Self-pay | Admitting: Nurse Practitioner

## 2022-06-23 ENCOUNTER — Ambulatory Visit: Payer: Medicare Other

## 2022-06-23 DIAGNOSIS — I739 Peripheral vascular disease, unspecified: Secondary | ICD-10-CM

## 2022-06-24 ENCOUNTER — Other Ambulatory Visit: Payer: Self-pay

## 2022-06-24 ENCOUNTER — Ambulatory Visit (INDEPENDENT_AMBULATORY_CARE_PROVIDER_SITE_OTHER): Payer: Medicare Other

## 2022-06-24 ENCOUNTER — Ambulatory Visit: Payer: Medicare Other | Attending: Nurse Practitioner

## 2022-06-24 DIAGNOSIS — I739 Peripheral vascular disease, unspecified: Secondary | ICD-10-CM | POA: Diagnosis not present

## 2022-06-24 DIAGNOSIS — I6523 Occlusion and stenosis of bilateral carotid arteries: Secondary | ICD-10-CM | POA: Insufficient documentation

## 2022-06-24 DIAGNOSIS — Z95828 Presence of other vascular implants and grafts: Secondary | ICD-10-CM | POA: Diagnosis not present

## 2022-06-25 ENCOUNTER — Other Ambulatory Visit: Payer: Self-pay | Admitting: Family Medicine

## 2022-06-25 DIAGNOSIS — I255 Ischemic cardiomyopathy: Secondary | ICD-10-CM

## 2022-06-25 DIAGNOSIS — J431 Panlobular emphysema: Secondary | ICD-10-CM

## 2022-06-25 DIAGNOSIS — I10 Essential (primary) hypertension: Secondary | ICD-10-CM

## 2022-06-25 DIAGNOSIS — I739 Peripheral vascular disease, unspecified: Secondary | ICD-10-CM

## 2022-06-25 DIAGNOSIS — N1831 Chronic kidney disease, stage 3a: Secondary | ICD-10-CM

## 2022-06-25 DIAGNOSIS — E038 Other specified hypothyroidism: Secondary | ICD-10-CM

## 2022-06-25 DIAGNOSIS — I2510 Atherosclerotic heart disease of native coronary artery without angina pectoris: Secondary | ICD-10-CM

## 2022-06-25 DIAGNOSIS — E7800 Pure hypercholesterolemia, unspecified: Secondary | ICD-10-CM

## 2022-06-29 ENCOUNTER — Other Ambulatory Visit: Payer: Self-pay

## 2022-06-29 MED ORDER — EMPAGLIFLOZIN 10 MG PO TABS: 10.0000 mg | ORAL_TABLET | Freq: Every day | ORAL | 3 refills | Status: DC

## 2022-06-30 ENCOUNTER — Ambulatory Visit: Payer: Medicare Other | Attending: Cardiovascular Disease | Admitting: Cardiovascular Disease

## 2022-06-30 ENCOUNTER — Encounter: Payer: Self-pay | Admitting: Cardiovascular Disease

## 2022-06-30 VITALS — BP 116/46 | HR 75 | Ht 69.0 in | Wt 161.8 lb

## 2022-06-30 DIAGNOSIS — I5032 Chronic diastolic (congestive) heart failure: Secondary | ICD-10-CM | POA: Diagnosis not present

## 2022-06-30 DIAGNOSIS — I739 Peripheral vascular disease, unspecified: Secondary | ICD-10-CM | POA: Diagnosis not present

## 2022-06-30 DIAGNOSIS — Z86718 Personal history of other venous thrombosis and embolism: Secondary | ICD-10-CM | POA: Diagnosis not present

## 2022-06-30 DIAGNOSIS — I2510 Atherosclerotic heart disease of native coronary artery without angina pectoris: Secondary | ICD-10-CM

## 2022-06-30 DIAGNOSIS — I6523 Occlusion and stenosis of bilateral carotid arteries: Secondary | ICD-10-CM

## 2022-06-30 DIAGNOSIS — I701 Atherosclerosis of renal artery: Secondary | ICD-10-CM | POA: Diagnosis not present

## 2022-06-30 DIAGNOSIS — I714 Abdominal aortic aneurysm, without rupture, unspecified: Secondary | ICD-10-CM

## 2022-06-30 DIAGNOSIS — E785 Hyperlipidemia, unspecified: Secondary | ICD-10-CM

## 2022-06-30 NOTE — Patient Instructions (Signed)
Medication Instructions:  STOP the Cilostazol (Pletal)  *If you need a refill on your cardiac medications before your next appointment, please call your pharmacy*   Lab Work: None ordered If you have labs (blood work) drawn today and your tests are completely normal, you will receive your results only by: [place] (if you have MyChart) OR A paper copy in the mail If you have any lab test that is abnormal or we need to change your treatment, we will call you to review the results.   Testing/Procedures: None ordered   Follow-Up: At [facility], you and your health needs are our priority.  As part of our continuing mission to provide you with exceptional heart care, we have created designated Provider Care Teams.  These Care Teams include your primary Cardiologist (physician) and Advanced Practice Providers (APPs -  Physician Assistants and Nurse Practitioners) who all work together to provide you with the care you need, when you need it.  We recommend signing up for the patient portal called "MyChart".  Sign up information is provided on this After Visit Summary.  MyChart is used to connect with patients for Virtual Visits (Telemedicine).  Patients are able to view lab/test results, encounter notes, upcoming appointments, etc.  Non-urgent messages can be sent to your provider as well.   To learn more about what you can do with MyChart, go to [email or web address].    Your next appointment:   4 month(s)  Provider:    [name], MD  Other Instructions EXERCISE PROGRAM FOR INDIVIDUALS WITH  PERIPHERAL ARTERIAL DISEASE (PAD)   General Information:   Research in vascular exercise has demonstrated remarkable improvement in symptoms of leg pain (claudication) without expensive or invasive interventions. Regular walking programs are extremely helpful for patients with PAD and intermittent claudication.  These steps are designed to help you get started with a safe  and effective program to help you walk farther with less pain:   Walk at least three times a week (preferably every day).  Your goal is to build up to 30-45 minutes of total walking time (not counting rest breaks). It may take you several weeks to build up your exercise time starting at 5-10 minutes or whatever you can tolerate.  Walk as far as possible using moderate to maximal pain (7-8 on the scale below) as a signal to stop, and resume walking when the pain goes away.  On a treadmill, set the speed and grade at a level that brings on the claudication pain within 3 to 5 minutes. Walk at this rate until you experience claudication of moderate severity, rest until the pain improves, and then resume walking.  Over time, you will be able to walk longer at the designated speed and grade; workload should then be increased until you develop the pain within 3 to 5 minutes once again.  This regimen will induce a significant benefit. Studies have demonstrated that participants may be able to walk up to three or four times farther and have less leg pain, within twelve weeks, by following this protocol.  Pain Scale    0_____1_____2_____3_____4_____5_____6_____7_____8_____9_____10   No Pain                                   Moderate Pain  Maximal Pain

## 2022-06-30 NOTE — Progress Notes (Signed)
Cardiology Office Note   Date:  06/30/2022   ID:  [name], DOB Nov 28, 1940, MRN [id number]  PCP:  Owens Loffler, MD  Cardiologist:   [name], MD   Chief Complaint  Patient presents with   Follow-up    Follow up visit up. Patient states that he has some stiffness in his upper neck. Only has shortness of breath when he exert himself. Meds reviewed.       History of Present Illness: [name] is a 82 y.o. male who presents for a followup visit regarding extensive peripheral arterial disease with previous stenting of the left subclavian artery and left axillary artery, bilateral common iliac artery stenting and  left renal artery stenting. The patient has multiple medical problems including hyperlipidemia, Small AAA, long smoking history for 60 years, COPD , moderate carotid disease, and atherosclerotic coronary artery disease status post CABG in March of 2013 with LIMA to the LAD, vein graft to the PDA, vein graft to the OM. He does have mild aortic valve stenosis.  He had a small intracranial hemorrhage in 2014 which was suspected to be due to malignant hypertension and dual antiplatelet therapy. Shortly after that he was diagnosed with extensive right-sided DVT. Anticoagulation was contraindicated and thus an IVC filter was placed.   He had extensive bilateral occlusive DVT in 2018 which required catheter-based therapy.  He has been on anticoagulation since then.   Right and left cardiac catheterization was done in November 2022 which showed severe native vessel disease with occluded left main and mid right coronary artery.  All his grafts were patent and normal.  Right heart catheterization showed moderately elevated wedge pressure, mild pulmonary hypertension and mildly reduced cardiac output.  Aortic stenosis was noted to be moderate to severe by echo but it was moderate by cath with mean gradient of 15 mmHg and valve area of 1.1 cm.    Due to worsening claudication,  he underwent repeated Doppler studies which showed an ABI of 0.61 on the right and 0.56 on the left.  His previous ABI was 0.74 bilaterally.  His iliac stents were not well visualized. Lower extremity angiography was performed in January 23  which showed patent left renal artery stent with moderate right renal artery stenosis, small abdominal aortic aneurysm, patent bilateral iliac kissing stents extending into the distal aorta with mild restenosis.  No other significant iliac disease.  On the right side, there was severe plaque in the distal common femoral artery extending into the ostium of the SFA with subtotal occlusion of the inferior branch of the profunda, diffuse calcified SFA disease with collaterals from the profunda and three-vessel runoff below the knee.  On the left side, there was diffuse calcified disease of the SFA with collaterals from the profunda, subtotal occlusion of the popliteal artery behind the knee and three-vessel runoff below the knee.  He was started on cilostazol and reports improvement in claudication.  He had an outpatient monitor done in October which showed 22 episodes of ventricular tachycardia the longest lasting 6 beats.  In addition, he had 24 SVTs.  He had an echocardiogram done in October which showed normal LV systolic function with mild pulmonary hypertension and mild to moderate aortic stenosis with mean gradient of 16 mmHg.  He was hospitalized in October with right hip fracture, pneumothorax and subcutaneous emphysema.  He was transferred to [facility].  He was diagnosed with A-fib with RVR. He was hospice is in November with acute  on chronic diastolic heart failure. He had massive volume overload in December and diuresed 15 to 20 pounds.  He underwent TEE guided cardioversion in January.  The TEE showed an EF of 40 to 45%.  He reports significant improvement in shortness of breath after cardioversion and diuresis.  He continues to be limited by bilateral lower  extremity claudication that is worse on the left side.  No rest pain or lower extremity ulceration.  Past Medical History:  Diagnosis Date   ([place]) heart failure with improved ejection fraction ([place])    a. 06/2011 Echo: EF 35-40%; b. 06/2012 Echo: EF 50%; c. 12/2016 Echo: EF 55-60%, no rwma, GRI DD, mild AS/MR, nl RV fxn, nl RVSP; d. 02/2021 Echo: EF 55%, GrII DD. Mild MR. Mild to mod TR. Sev AS by VTI.   AAA (abdominal aortic aneurysm) ([place])    a. 05/2020 Abd u/s: 3.6cm.   Aortic calcification (HCC) 05/01/2013   Bilateral renal artery stenosis ([place])    a. 06/2013 Angio/PTA: LRA: 95 (5x18 Herculink stent), RRA 60ost; b. 04/2021 Angio: mod RRA stenosis w/ patent LRA stent.   Carotid arterial disease ([place])    a. 05/2020 Carotid U/S: RICA 123456, LICA 123456   CHF (congestive heart failure) (HCC)    Chronic kidney disease    COPD (chronic obstructive pulmonary disease) (HCC)    Coronary artery disease    a. 2013 s/p CABG x 3 (LIMA->LAD, VG->OM, VG->RPDA; b. 06/2012 MV: no ischemia; c. 02/2021 Cath: LM 100, RCA 90ost, 147m free LIMA->LAD nl, VG->OM2 nl, VG->RPDA nl. Mod AS (mean grad 171mg, AVA 1.1cm^2). RHC w/ elev filling pressures.   History of prior cigarette smoking 04/11/2008   Qualifier: Diagnosis of  By: BeDiona BrownerD, Amy     Hyperlipidemia    Hypertension    Hypothyroidism    Intraventricular hemorrhage ([facility]03/20/2014   Ischemic cardiomyopathy    a. 06/2011 Echo: EF 35-40%; b. 06/2012 Echo: EF 50%; c. 12/2016 Echo: EF 55-60%, no rwma, GRI DD; d. 02/2021 Echo: Ef 55%, GrII DD.   Moderate aortic stenosis    a. 02/2021 Echo: EF 55%, GrII DD. Sev Ca2+ of AoV. Sev AS w/ AVA by VTI 0.79cm^2; b. 02/2021 Cath: Mod AS w/ mean grad 1557m. AVA 1.1cm^2.   PAF (paroxysmal atrial fibrillation) ([place]  Peripheral arterial disease ([facility]  a. Previous left lower extremity stenting by Dr. SanJamal Collinb. 12/2012 s/p bilat ostial common iliac stenting; c. 04/2021 Angio: Patent LRA stent, mod RRA stenosis.  Small AAA. Sev plaque RSFA 2/ subtl occl of inf branch of profunda. Diff Ca2+ SFA dzs w/ collats from profunda and 3V runoff. Diffuse LSFA dzs w/ collats from profunda. Subtl PT dzs. 3V runoff-->Med Rx.   Right leg DVT ([facility]CC[place]4/21/2014   Subclavian artery stenosis, left (HCCTri[place]2/2014   Status post stenting of the ostium and self-expanding stent placement to the left axillary artery   Venous insufficiency     Past Surgical History:  Procedure Laterality Date   ABDOMINAL ANGIOGRAM  07/10/2013   WITH BI-FEMORAL RUNOFF       DR ARIFletcher AnonABDOMINAL AORTAGRAM N/A 12/26/2012   Procedure: ABDOMINAL AORMaxcine HamSurgeon: MuhWellington HampshireD;  [facility];  Service: Cardiovascular;  Laterality: N/A;   ABDOMINAL AORTAGRAM N/A 07/10/2013   Procedure: ABDOMINAL AORMaxcine HamSurgeon: MuhWellington HampshireD;  [facility];  Service: Cardiovascular;  Laterality: N/A;   ABDOMINAL AORTOGRAM W/LOWER EXTREMITY N/A 05/12/2021  Procedure: ABDOMINAL AORTOGRAM W/LOWER EXTREMITY;  Surgeon: Wellington Hampshire, MD;  Location: [place] [facility];  Service: Cardiovascular;  Laterality: N/A;   ANGIOPLASTY / STENTING FEMORAL  05/2012   ANGIOPLASTY / STENTING ILIAC Bilateral 12/26/2012   ARCH AORTOGRAM N/A 06/20/2012   Procedure: ARCH AORTOGRAM;  Surgeon: Wellington Hampshire, MD;  Location: [place] [facility];  Service: Cardiovascular;  Laterality: N/A;   CARDIAC CATHETERIZATION  05/2012   [place]   CARDIOVERSION N/A 05/23/2022   Procedure: CARDIOVERSION;  Surgeon: Jolaine Artist, MD;  Location: [place] [facility];  Service: Cardiovascular;  Laterality: N/A;   CARDIOVERSION N/A 06/14/2022   Procedure: CARDIOVERSION;  Surgeon: Jolaine Artist, MD;  Location: [facility];  Service: Cardiovascular;  Laterality: N/A;   CHOLECYSTECTOMY  1990's   CORONARY ARTERY BYPASS GRAFT  07/11/2011   Procedure: CORONARY ARTERY BYPASS GRAFTING (CABG);  Surgeon: Grace Isaac, MD;  Location: [place];  Service: Open Heart Surgery;  Laterality:  N/A;  Times 3. On Pump. Using right greater saphenous vein and left internal mammary artery.    CORONARY ARTERY BYPASS GRAFT     HERNIA REPAIR     INSERTION OF ILIAC STENT Bilateral 12/26/2012   Procedure: INSERTION OF ILIAC STENT;  Surgeon: Wellington Hampshire, MD;  Location: [place] [facility];  Service: Cardiovascular;  Laterality: Bilateral;  Bilateral Common Iliac Artery   LEFT AND RIGHT HEART CATHETERIZATION WITH CORONARY ANGIOGRAM  07/07/2011   Procedure: LEFT AND RIGHT HEART CATHETERIZATION WITH CORONARY ANGIOGRAM;  Surgeon: Minna Merritts, MD;  Location: [place] [facility];  Service: Cardiovascular;;   LOWER EXTREMITY VENOGRAPHY Bilateral 12/30/2016   Procedure: Lower Extremity Venography;  Surgeon: Katha Cabal, MD;  Location: [place] [facility];  Service: Cardiovascular;  Laterality: Bilateral;   PERCUTANEOUS STENT INTERVENTION  06/20/2012   Procedure: PERCUTANEOUS STENT INTERVENTION;  Surgeon: Wellington Hampshire, MD;  Location: [place] [facility];  Service: Cardiovascular;;   RENAL ARTERY ANGIOPLASTY Left 07/10/2013   DR Dasiah Hooley   RIGHT/LEFT HEART CATH AND CORONARY/GRAFT ANGIOGRAPHY N/A 03/15/2021   Procedure: RIGHT/LEFT HEART CATH AND CORONARY/GRAFT ANGIOGRAPHY;  Surgeon: Wellington Hampshire, MD;  Location: [place] [facility];  Service: Cardiovascular;  Laterality: N/A;   SUBCLAVIAN ARTERY STENT  05/2012   "2 stents" (12/26/2012)   TEE WITHOUT CARDIOVERSION N/A 05/23/2022   Procedure: TRANSESOPHAGEAL ECHOCARDIOGRAM (TEE);  Surgeon: Jolaine Artist, MD;  Location: [facility];  Service: Cardiovascular;  Laterality: N/A;   TOOTH EXTRACTION  Spring 2017   mulitple (6)   VENA CAVA FILTER PLACEMENT  07/2012   Removable     Current Outpatient Medications  Medication Sig Dispense Refill   albuterol (VENTOLIN HFA) 108 (90 Base) MCG/ACT inhaler Inhale 2 puffs into the lungs every 6 (six) hours as needed for wheezing or shortness of breath. 18 g 0   amiodarone (PACERONE) 200 MG tablet Take 1 tablet (200  mg total) by mouth 2 (two) times daily. 60 tablet 3   atorvastatin (LIPITOR) 20 MG tablet TAKE 1 TABLET BY MOUTH EVERY DAY 30 tablet 5   augmented betamethasone dipropionate (DIPROLENE-AF) 0.05 % cream APPLY TO AFFECTED AREA TWICE A DAY     cilostazol (PLETAL) 50 MG tablet TAKE 1 TABLET BY MOUTH TWICE A DAY 60 tablet 2   empagliflozin (JARDIANCE) 10 MG TABS tablet Take 1 tablet (10 mg total) by mouth daily before breakfast. 90 tablet 3   furosemide (LASIX) 40 MG tablet Take 1 tablet (40 mg total) by mouth 2 (two) times daily. [place]  tablet 3   levothyroxine (SYNTHROID) 150 MCG tablet TAKE ONE TABLET BY MOUTH EVERY DAY BEFORE BREAKFAST 90 tablet 0   metoprolol succinate (TOPROL XL) 25 MG 24 hr tablet Take 1 tablet (25 mg total) by mouth 2 (two) times daily. 60 tablet 3   tamsulosin (FLOMAX) 0.4 MG CAPS capsule Take 1 capsule (0.4 mg total) by mouth daily after supper. 30 capsule 6   warfarin (COUMADIN) 5 MG tablet Take 2.5 mg by mouth daily. 1/2 tab daily except 1 tab on mon,wed,frid     empagliflozin (JARDIANCE) 10 MG TABS tablet Take 1 tablet (10 mg total) by mouth daily before breakfast. (Patient not taking: Reported on 06/30/2022) 30 tablet 3   No current facility-administered medications for this visit.    Allergies:   Plavix [clopidogrel bisulfate]    Social History:  The patient  reports that he quit smoking about 11 years ago. His smoking use included cigarettes. He has a 60.00 pack-year smoking history. He has never used smokeless tobacco. He reports that he does not drink alcohol and does not use drugs.   Family History:  The patient's family history includes Alcohol abuse in his father; Cirrhosis in his father; Hypothyroidism in his sister.    ROS:  Please see the history of present illness.   Otherwise, review of systems are positive for none.   All other systems are reviewed and negative.    PHYSICAL EXAM: VS:  BP (!) 116/46 (BP Location: Right Arm, Patient Position: Sitting, Cuff  Size: Normal)   Pulse 75   Ht '5\' 9"'$  (1.753 m)   Wt 161 lb 12.8 oz (73.4 kg)   SpO2 93%   BMI 23.89 kg/m  , BMI Body mass index is 23.89 kg/m. GEN: Well nourished, well developed, in no acute distress  HEENT: normal  Neck: no JVD or masses. There is left carotid bruit and a bruit in the left subclavian artery area. Cardiac: RRR; no , rubs, or gallops,no edema . 2/6 systolic ejection murmur in the aortic area Respiratory:  clear to auscultation bilaterally, normal work of breathing GI: soft, nontender, nondistended, + BS MS: no deformity or atrophy  Skin: warm and dry, no rash Neuro:  Strength and sensation are intact Psych: euthymic mood, full affect   EKG:  EKG is  ordered today. EKG showed normal sinus rhythm with right bundle branch block.   Recent Labs: 02/08/2022: Magnesium 2.4 06/07/2022: ALT 11; Hemoglobin 12.0; Platelets 326; TSH 0.645 06/20/2022: B Natriuretic Peptide 585.2; BUN 25; Creatinine, Ser 1.63; Potassium 3.7; Sodium 141    Lipid Panel    Component Value Date/Time   CHOL 121 07/05/2021 0849   CHOL 125 01/31/2014 0756   TRIG 115.0 07/05/2021 0849   HDL 59.40 07/05/2021 0849   HDL 63 01/31/2014 0756   CHOLHDL 2 07/05/2021 0849   VLDL 23.0 07/05/2021 0849   LDLCALC 39 07/05/2021 0849   LDLCALC 46 01/31/2014 0756      Wt Readings from Last 3 Encounters:  06/30/22 161 lb 12.8 oz (73.4 kg)  06/20/22 166 lb 4 oz (75.4 kg)  06/14/22 163 lb (73.9 kg)        ASSESSMENT AND PLAN:   1. Peripheral arterial disease with previous iliac stenting.  He has significant bilateral calf claudication due to SFA/popliteal disease.  Revascularization options are not straightforward and requires endarterectomy on the right side.  I do not think he is a good surgical candidate.  On the left side, his SFA and popliteal  arteries are severely calcified and diseased.  Again he is not a candidate for femoral-popliteal bypass and endovascular options are possible but risky  considering degree of calcifications.  His whole SFA and popliteal artery will require left extremity with risk of distal embolization. Given recent heart failure episodes, I elected to discontinue cilostazol.  This medication did not help his symptoms much anyway. I asked him to start exercising on a stationary bike.  2. Coronary artery disease involving native coronary arteries without angina: Most recent cardiac catheterization showed patent grafts.  Continue medical therapy.  3.  Bilateral renal artery stenosis: Status post left renal artery stenting.  Renal artery stent was patent on recent angiography.  4. Abdominal aortic aneurysm: This was small on most recent ultrasound .  5 . Essential hypertension: Blood pressure is well controlled on current medications.  6. Bilateral carotid artery stenosis: Carotid Doppler this month showed 40 to 59% stenosis on the left side.  7.  Status post left subclavian artery and left axillary artery stenting.  Stents were patent on recent cardiac cath angiography.  8.  History of previous extensive lower extremity DVT.  Currently on lifelong anticoagulation with warfarin .    9.  Hyperlipidemia: Continue treatment with atorvastatin.  I reviewed most recent lipid profile which showed an LDL of 50.  10.  Chronic systolic heart failure: He appears to be euvolemic.  He follows at the heart failure clinic.  11.  Persistent atrial fibrillation: He is maintaining in sinus rhythm with amiodarone.    Disposition: Follow-up in 4 months.  Signed,  [name], MD  06/30/2022 3:09 PM    [facility]

## 2022-07-01 ENCOUNTER — Other Ambulatory Visit: Payer: Self-pay | Admitting: *Deleted

## 2022-07-01 DIAGNOSIS — I6523 Occlusion and stenosis of bilateral carotid arteries: Secondary | ICD-10-CM

## 2022-07-01 DIAGNOSIS — I739 Peripheral vascular disease, unspecified: Secondary | ICD-10-CM

## 2022-07-04 DIAGNOSIS — I739 Peripheral vascular disease, unspecified: Secondary | ICD-10-CM | POA: Diagnosis not present

## 2022-07-04 DIAGNOSIS — B351 Tinea unguium: Secondary | ICD-10-CM | POA: Diagnosis not present

## 2022-07-05 NOTE — Addendum Note (Signed)
Addended by: Nestor Ramp on: 07/05/2022 10:04 AM   Modules accepted: Orders

## 2022-07-08 ENCOUNTER — Other Ambulatory Visit: Payer: Self-pay | Admitting: Family Medicine

## 2022-07-08 NOTE — Telephone Encounter (Signed)
Please call and schedule CPE with fasting labs prior with Dr. [name].  Already has [place] scheduled.

## 2022-07-11 NOTE — Telephone Encounter (Signed)
Patient has been scheduled

## 2022-07-13 ENCOUNTER — Ambulatory Visit (INDEPENDENT_AMBULATORY_CARE_PROVIDER_SITE_OTHER): Payer: Medicare Other

## 2022-07-13 VITALS — Ht 69.0 in | Wt 160.0 lb

## 2022-07-13 DIAGNOSIS — Z0000 Encounter for general adult medical examination without abnormal findings: Secondary | ICD-10-CM | POA: Diagnosis not present

## 2022-07-13 NOTE — Patient Instructions (Signed)
[name] , Thank you for taking time to come for your Medicare Wellness Visit. I appreciate your ongoing commitment to your health goals. Please review the following plan we discussed and let me know if I can assist you in the future.   These are the goals we discussed:  Goals      Increase physical activity     Starting 09/08/16, I will continue to walk at least 30 min daily.      Patient Stated     01/09/2019, Patient states that he wants to increase his exercise in the future     Patient Stated     Would like to have the ability to be more active      Patient Stated     No new goals.        This is a list of the screening recommended for you and due dates:  Health Maintenance  Topic Date Due   Zoster (Shingles) Vaccine (1 of 2) Never done   COVID-19 Vaccine (4 - 2023-24 season) 12/24/2021   Flu Shot  07/24/2022*   Medicare Annual Wellness Visit  07/13/2023   DTaP/Tdap/Td vaccine (3 - Td or Tdap) 02/07/2032   Pneumonia Vaccine  Completed   HPV Vaccine  Aged Out   Screening for Lung Cancer  Discontinued  *Topic was postponed. The date shown is not the original due date.    Advanced directives: Advance directive discussed with you today. Even though you declined this today, please call our office should you change your mind, and we can give you the proper paperwork for you to fill out.   Conditions/risks identified: Aim for 30 minutes of exercise or brisk walking, 6-8 glasses of water, and 5 servings of fruits and vegetables each day.   Next appointment: Follow up in one year for your annual wellness visit. 07/18/23 @ 9:30 telephone visit.  Preventive Care 65 Years and Older, Male  Preventive care refers to lifestyle choices and visits with your health care provider that can promote health and wellness. What does preventive care include? A yearly physical exam. This is also called an annual well check. Dental exams once or twice a year. Routine eye exams. Ask your  health care provider how often you should have your eyes checked. Personal lifestyle choices, including: Daily care of your teeth and gums. Regular physical activity. Eating a healthy diet. Avoiding tobacco and drug use. Limiting alcohol use. Practicing safe sex. Taking low doses of aspirin every day. Taking vitamin and mineral supplements as recommended by your health care provider. What happens during an annual well check? The services and screenings done by your health care provider during your annual well check will depend on your age, overall health, lifestyle risk factors, and family history of disease. Counseling  Your health care provider may ask you questions about your: Alcohol use. Tobacco use. Drug use. Emotional well-being. Home and relationship well-being. Sexual activity. Eating habits. History of falls. Memory and ability to understand (cognition). Work and work Statistician. Screening  You may have the following tests or measurements: Height, weight, and BMI. Blood pressure. Lipid and cholesterol levels. These may be checked every 5 years, or more frequently if you are over 92 years old. Skin check. Lung cancer screening. You may have this screening every year starting at age 6 if you have a 30-pack-year history of smoking and currently smoke or have quit within the past 15 years. Fecal occult blood test (FOBT) of the stool. You may  have this test every year starting at age 56. Flexible sigmoidoscopy or colonoscopy. You may have a sigmoidoscopy every 5 years or a colonoscopy every 10 years starting at age 90. Prostate cancer screening. Recommendations will vary depending on your family history and other risks. Hepatitis C blood test. Hepatitis B blood test. Sexually transmitted disease (STD) testing. Diabetes screening. This is done by checking your blood sugar (glucose) after you have not eaten for a while (fasting). You may have this done every 1-3  years. Abdominal aortic aneurysm (AAA) screening. You may need this if you are a current or former smoker. Osteoporosis. You may be screened starting at age 69 if you are at high risk. Talk with your health care provider about your test results, treatment options, and if necessary, the need for more tests. Vaccines  Your health care provider may recommend certain vaccines, such as: Influenza vaccine. This is recommended every year. Tetanus, diphtheria, and acellular pertussis (Tdap, Td) vaccine. You may need a Td booster every 10 years. Zoster vaccine. You may need this after age 8. Pneumococcal 13-valent conjugate (PCV13) vaccine. One dose is recommended after age 15. Pneumococcal polysaccharide (PPSV23) vaccine. One dose is recommended after age 23. Talk to your health care provider about which screenings and vaccines you need and how often you need them. This information is not intended to replace advice given to you by your health care provider. Make sure you discuss any questions you have with your health care provider. Document Released: 05/08/2015 Document Revised: 12/30/2015 Document Reviewed: 02/10/2015 Elsevier Interactive Patient Education  2017 [place] Prevention in the Home Falls can cause injuries. They can happen to people of all ages. There are many things you can do to make your home safe and to help prevent falls. What can I do on the outside of my home? Regularly fix the edges of walkways and driveways and fix any cracks. Remove anything that might make you trip as you walk through a door, such as a raised step or threshold. Trim any bushes or trees on the path to your home. Use bright outdoor lighting. Clear any walking paths of anything that might make someone trip, such as rocks or tools. Regularly check to see if handrails are loose or broken. Make sure that both sides of any steps have handrails. Any raised decks and porches should have guardrails on the  edges. Have any leaves, snow, or ice cleared regularly. Use sand or salt on walking paths during winter. Clean up any spills in your garage right away. This includes oil or grease spills. What can I do in the bathroom? Use night lights. Install grab bars by the toilet and in the tub and shower. Do not use towel bars as grab bars. Use non-skid mats or decals in the tub or shower. If you need to sit down in the shower, use a plastic, non-slip stool. Keep the floor dry. Clean up any water that spills on the floor as soon as it happens. Remove soap buildup in the tub or shower regularly. Attach bath mats securely with double-sided non-slip rug tape. Do not have throw rugs and other things on the floor that can make you trip. What can I do in the bedroom? Use night lights. Make sure that you have a light by your bed that is easy to reach. Do not use any sheets or blankets that are too big for your bed. They should not hang down onto the floor. Have a firm  chair that has side arms. You can use this for support while you get dressed. Do not have throw rugs and other things on the floor that can make you trip. What can I do in the kitchen? Clean up any spills right away. Avoid walking on wet floors. Keep items that you use a lot in easy-to-reach places. If you need to reach something above you, use a strong step stool that has a grab bar. Keep electrical cords out of the way. Do not use floor polish or wax that makes floors slippery. If you must use wax, use non-skid floor wax. Do not have throw rugs and other things on the floor that can make you trip. What can I do with my stairs? Do not leave any items on the stairs. Make sure that there are handrails on both sides of the stairs and use them. Fix handrails that are broken or loose. Make sure that handrails are as long as the stairways. Check any carpeting to make sure that it is firmly attached to the stairs. Fix any carpet that is loose or  worn. Avoid having throw rugs at the top or bottom of the stairs. If you do have throw rugs, attach them to the floor with carpet tape. Make sure that you have a light switch at the top of the stairs and the bottom of the stairs. If you do not have them, ask someone to add them for you. What else can I do to help prevent falls? Wear shoes that: Do not have high heels. Have rubber bottoms. Are comfortable and fit you well. Are closed at the toe. Do not wear sandals. If you use a stepladder: Make sure that it is fully opened. Do not climb a closed stepladder. Make sure that both sides of the stepladder are locked into place. Ask someone to hold it for you, if possible. Clearly mark and make sure that you can see: Any grab bars or handrails. First and last steps. Where the edge of each step is. Use tools that help you move around (mobility aids) if they are needed. These include: Canes. Walkers. Scooters. Crutches. Turn on the lights when you go into a dark area. Replace any light bulbs as soon as they burn out. Set up your furniture so you have a clear path. Avoid moving your furniture around. If any of your floors are uneven, fix them. If there are any pets around you, be aware of where they are. Review your medicines with your doctor. Some medicines can make you feel dizzy. This can increase your chance of falling. Ask your doctor what other things that you can do to help prevent falls. This information is not intended to replace advice given to you by your health care provider. Make sure you discuss any questions you have with your health care provider. Document Released: 02/05/2009 Document Revised: 09/17/2015 Document Reviewed: 05/16/2014 Elsevier Interactive Patient Education  2017 [employer].

## 2022-07-13 NOTE — Progress Notes (Signed)
I connected with  [name] on 07/13/22 by a audio enabled telemedicine application and verified that I am speaking with the correct person using two identifiers.  Patient Location: Home  Provider Location: Home Office  I discussed the limitations of evaluation and management by telemedicine. The patient expressed understanding and agreed to proceed.  Subjective:   [name] is a 82 y.o. male who presents for Medicare Annual/Subsequent preventive examination.  Review of Systems      Cardiac Risk Factors include: advanced age (>42men, >31 women);hypertension     Objective:    Today's Vitals   07/13/22 1407  Weight: 160 lb (72.6 kg)  Height: 5\' 9"  (1.753 m)   Body mass index is 23.63 kg/m.     07/13/2022    2:19 PM 03/24/2022   10:00 AM 03/23/2022   12:26 PM 02/06/2022    3:09 PM 02/06/2022   11:52 AM 11/09/2021    9:35 AM 07/05/2021    2:00 PM  Advanced Directives  Does Patient Have a Medical Advance Directive? Yes No No Yes Yes No Yes  Type of Paramedic of [place];Living will      [place];Living will  Does patient want to make changes to medical advance directive?       Yes (MAU/Ambulatory/Procedural Areas - Information given)  Copy of [place] in Chart? No - copy requested        Would patient like information on creating a medical advance directive?  No - Patient declined    No - Patient declined     Current Medications (verified) Outpatient Encounter Medications as of 07/13/2022  Medication Sig   albuterol (VENTOLIN HFA) 108 (90 Base) MCG/ACT inhaler Inhale 2 puffs into the lungs every 6 (six) hours as needed for wheezing or shortness of breath.   amiodarone (PACERONE) 200 MG tablet Take 1 tablet (200 mg total) by mouth 2 (two) times daily.   atorvastatin (LIPITOR) 20 MG tablet TAKE 1 TABLET BY MOUTH EVERY DAY   augmented betamethasone dipropionate (DIPROLENE-AF) 0.05 % cream APPLY TO  AFFECTED AREA TWICE A DAY   empagliflozin (JARDIANCE) 10 MG TABS tablet Take 1 tablet (10 mg total) by mouth daily before breakfast.   empagliflozin (JARDIANCE) 10 MG TABS tablet Take 1 tablet (10 mg total) by mouth daily before breakfast.   furosemide (LASIX) 40 MG tablet Take 1 tablet (40 mg total) by mouth 2 (two) times daily.   levothyroxine (SYNTHROID) 150 MCG tablet TAKE ONE TABLET BY MOUTH EVERY DAY BEFORE BREAKFAST   metoprolol succinate (TOPROL XL) 25 MG 24 hr tablet Take 1 tablet (25 mg total) by mouth 2 (two) times daily.   tamsulosin (FLOMAX) 0.4 MG CAPS capsule Take 1 capsule (0.4 mg total) by mouth daily after supper.   warfarin (COUMADIN) 5 MG tablet Take 2.5 mg by mouth daily. 1/2 tab daily except 1 tab on mon,wed,frid   No facility-administered encounter medications on file as of 07/13/2022.    Allergies (verified) Plavix [clopidogrel bisulfate]   History: Past Medical History:  Diagnosis Date   (HFimpEF) heart failure with improved ejection fraction ([place])    a. 06/2011 Echo: EF 35-40%; b. 06/2012 Echo: EF 50%; c. 12/2016 Echo: EF 55-60%, no rwma, GRI DD, mild AS/MR, nl RV fxn, nl RVSP; d. 02/2021 Echo: EF 55%, GrII DD. Mild MR. Mild to mod TR. Sev AS by VTI.   AAA (abdominal aortic aneurysm) ([place])    a. 05/2020 Abd  u/s: 3.6cm.   Aortic calcification (HCC) 05/01/2013   Bilateral renal artery stenosis ([place])    a. 06/2013 Angio/PTA: LRA: 95 (5x18 Herculink stent), RRA 60ost; b. 04/2021 Angio: mod RRA stenosis w/ patent LRA stent.   Carotid arterial disease ([place])    a. 05/2020 Carotid U/S: RICA 123456, LICA 123456   CHF (congestive heart failure) (HCC)    Chronic kidney disease    COPD (chronic obstructive pulmonary disease) (HCC)    Coronary artery disease    a. 2013 s/p CABG x 3 (LIMA->LAD, VG->OM, VG->RPDA; b. 06/2012 MV: no ischemia; c. 02/2021 Cath: LM 100, RCA 90ost, 120m, free LIMA->LAD nl, VG->OM2 nl, VG->RPDA nl. Mod AS (mean grad 27mmHg, AVA 1.1cm^2). RHC w/ elev filling  pressures.   History of prior cigarette smoking 04/11/2008   Qualifier: Diagnosis of  By: Diona Browner MD, Amy     Hyperlipidemia    Hypertension    Hypothyroidism    Intraventricular hemorrhage ([place]) 07/12/2012   Ischemic cardiomyopathy    a. 06/2011 Echo: EF 35-40%; b. 06/2012 Echo: EF 50%; c. 12/2016 Echo: EF 55-60%, no rwma, GRI DD; d. 02/2021 Echo: Ef 55%, GrII DD.   Moderate aortic stenosis    a. 02/2021 Echo: EF 55%, GrII DD. Sev Ca2+ of AoV. Sev AS w/ AVA by VTI 0.79cm^2; b. 02/2021 Cath: Mod AS w/ mean grad 70mmHg. AVA 1.1cm^2.   PAF (paroxysmal atrial fibrillation) ([place])    Peripheral arterial disease ([place])    a. Previous left lower extremity stenting by Dr. Jamal Collin;  b. 12/2012 s/p bilat ostial common iliac stenting; c. 04/2021 Angio: Patent LRA stent, mod RRA stenosis. Small AAA. Sev plaque RSFA 2/ subtl occl of inf branch of profunda. Diff Ca2+ SFA dzs w/ collats from profunda and 3V runoff. Diffuse LSFA dzs w/ collats from profunda. Subtl PT dzs. 3V runoff-->Med Rx.   Right leg DVT ([place]) 08/13/2012   Subclavian artery stenosis, left ([place]) 05/2012   Status post stenting of the ostium and self-expanding stent placement to the left axillary artery   Venous insufficiency    Past Surgical History:  Procedure Laterality Date   ABDOMINAL ANGIOGRAM  07/10/2013   WITH BI-FEMORAL RUNOFF       DR Fletcher Anon   ABDOMINAL AORTAGRAM N/A 12/26/2012   Procedure: ABDOMINAL Maxcine Ham;  Surgeon: Wellington Hampshire, MD;  Location: [facility] CATH LAB;  Service: Cardiovascular;  Laterality: N/A;   ABDOMINAL AORTAGRAM N/A 07/10/2013   Procedure: ABDOMINAL Maxcine Ham;  Surgeon: Wellington Hampshire, MD;  Location: [facility] CATH LAB;  Service: Cardiovascular;  Laterality: N/A;   ABDOMINAL AORTOGRAM W/LOWER EXTREMITY N/A 05/12/2021   Procedure: ABDOMINAL AORTOGRAM W/LOWER EXTREMITY;  Surgeon: Wellington Hampshire, MD;  Location: [facility] CV LAB;  Service: Cardiovascular;  Laterality: N/A;   ANGIOPLASTY / STENTING FEMORAL  05/2012    ANGIOPLASTY / STENTING ILIAC Bilateral 12/26/2012   ARCH AORTOGRAM N/A 06/20/2012   Procedure: ARCH AORTOGRAM;  Surgeon: Wellington Hampshire, MD;  Location: [facility] CATH LAB;  Service: Cardiovascular;  Laterality: N/A;   CARDIAC CATHETERIZATION  05/2012   [place]   CARDIOVERSION N/A 05/23/2022   Procedure: CARDIOVERSION;  Surgeon: Jolaine Artist, MD;  Location: [facility];  Service: Cardiovascular;  Laterality: N/A;   CARDIOVERSION N/A 06/14/2022   Procedure: CARDIOVERSION;  Surgeon: Jolaine Artist, MD;  Location: [facility] ORS;  Service: Cardiovascular;  Laterality: N/A;   CHOLECYSTECTOMY  1990's   CORONARY ARTERY BYPASS GRAFT  07/11/2011   Procedure: CORONARY ARTERY BYPASS GRAFTING (CABG);  Surgeon: Lilia Argue  Servando Snare, MD;  Location: [facility];  Service: Open Heart Surgery;  Laterality: N/A;  Times 3. On Pump. Using right greater saphenous vein and left internal mammary artery.    CORONARY ARTERY BYPASS GRAFT     HERNIA REPAIR     INSERTION OF ILIAC STENT Bilateral 12/26/2012   Procedure: INSERTION OF ILIAC STENT;  Surgeon: Wellington Hampshire, MD;  Location: [facility] CATH LAB;  Service: Cardiovascular;  Laterality: Bilateral;  Bilateral Common Iliac Artery   LEFT AND RIGHT HEART CATHETERIZATION WITH CORONARY ANGIOGRAM  07/07/2011   Procedure: LEFT AND RIGHT HEART CATHETERIZATION WITH CORONARY ANGIOGRAM;  Surgeon: Minna Merritts, MD;  Location: [facility] C[facility] LAB;  Service: Cardiovascular;;   LOWER EXTREMITY VENOGRAPHY Bilateral 12/30/2016   Procedure: Lower Extremity Venography;  Surgeon: Katha Cabal, MD;  Location: [facility] CV LAB;  Service: Cardiovascular;  Laterality: Bilateral;   PERCUTANEOUS STENT INTERVENTION  06/20/2012   Procedure: PERCUTANEOUS STENT INTERVENTION;  Surgeon: Wellington Hampshire, MD;  Location: [facility] CATH LAB;  Service: Cardiovascular;;   RENAL ARTERY ANGIOPLASTY Left 07/10/2013   DR ARIDA   RIGHT/LEFT HEART CATH AND CORONARY/GRAFT ANGIOGRAPHY N/A 03/15/2021   Procedure: RIGHT/LEFT HEART  CATH AND CORONARY/GRAFT ANGIOGRAPHY;  Surgeon: Wellington Hampshire, MD;  Location: [facility] CV LAB;  Service: Cardiovascular;  Laterality: N/A;   SUBCLAVIAN ARTERY STENT  05/2012   "2 stents" (12/26/2012)   TEE WITHOUT CARDIOVERSION N/A 05/23/2022   Procedure: TRANSESOPHAGEAL ECHOCARDIOGRAM (TEE);  Surgeon: Jolaine Artist, MD;  Location: [facility] ORS;  Service: Cardiovascular;  Laterality: N/A;   TOOTH EXTRACTION  Spring 2017   mulitple (6)   VENA CAVA FILTER PLACEMENT  07/2012   Removable   Family History  Problem Relation Age of Onset   Alcohol abuse Father    Cirrhosis Father    Hypothyroidism Sister    Social History   Socioeconomic History   Marital status: Married    Spouse name: Not on file   Number of children: 2   Years of education: Not on file   Highest education level: Not on file  Occupational History   Occupation: Retired    Fish farm manager: RETIRED    Comment: Land  Tobacco Use   Smoking status: Former    Packs/day: 1.00    Years: 60.00    Additional pack years: 0.00    Total pack years: 60.00    Types: Cigarettes    Quit date: 12/25/2010    Years since quitting: 11.5   Smokeless tobacco: Never  Vaping Use   Vaping Use: Never used  Substance and Sexual Activity   Alcohol use: No   Drug use: No   Sexual activity: Not Currently  Other Topics Concern   Not on file  Social History Narrative   Lives with wife, Joycelyn Schmid   Exercising 3 times a week.    Moderate diet control.   2 dogs   Social Determinants of Health   Financial Resource Strain: Low Risk  (07/13/2022)   Overall Financial Resource Strain (CARDIA)    Difficulty of Paying Living Expenses: Not hard at all  Food Insecurity: No Food Insecurity (07/13/2022)   Hunger Vital Sign    Worried About Running Out of Food in the Last Year: Never true    Ran Out of Food in the Last Year: Never true  Transportation Needs: No Transportation Needs (07/13/2022)   PRAPARE - Radiographer, therapeutic (Medical): No    Lack of Transportation (Non-Medical): No  Physical Activity: Insufficiently Active (07/13/2022)   Exercise Vital Sign    Days of Exercise per Week: 7 days    Minutes of Exercise per Session: 20 min  Stress: No Stress Concern Present (07/13/2022)   [place]    Feeling of Stress : Not at all  Social Connections: Moderately Integrated (07/13/2022)   Social Connection and Isolation Panel [NHANES]    Frequency of Communication with Friends and Family: More than three times a week    Frequency of Social Gatherings with Friends and Family: More than three times a week    Attends Religious Services: Never    Marine scientist or Organizations: Yes    Attends Music therapist: More than 4 times per year    Marital Status: Married    Tobacco Counseling Counseling given: Not Answered   Clinical Intake:  Pre-visit preparation completed: Yes  Pain : No/denies pain     Nutritional Risks: None Diabetes: No  How often do you need to have someone help you when you read instructions, pamphlets, or other written materials from your doctor or pharmacy?: 1 - Never  Diabetic? no  Interpreter Needed?: No  Information entered by :: C.Maci Eickholt LPN   Activities of Daily Living    07/13/2022    2:19 PM 06/14/2022    7:06 AM  In your present state of health, do you have any difficulty performing the following activities:  Hearing? 0 0  Vision? 0 0  Difficulty concentrating or making decisions? 0 0  Walking or climbing stairs? 1 0  Comment difficult but can do it.   Dressing or bathing? 0 0  Doing errands, shopping? 0   Preparing Food and eating ? N   Using the Toilet? N   In the past six months, have you accidently leaked urine? N   Do you have problems with loss of bowel control? N   Managing your Medications? N   Managing your Finances? N   Housekeeping or managing your  Housekeeping? N     Patient Care Team: Owens Loffler, MD as PCP - General (Family Medicine) Wellington Hampshire, MD as PCP - Cardiology (Cardiology) Vickie Epley, MD as PCP - Electrophysiology (Cardiology) Bensimhon, Shaune Pascal, MD as PCP - Advanced Heart Failure (Cardiology) Minna Merritts, MD as Consulting Physician (Cardiology) Owens Loffler, MD (Family Medicine)  Indicate any recent Medical Services you may have received from other than [facility] providers in the past year (date may be approximate).     Assessment:   This is a routine wellness examination for [name].  Hearing/Vision screen Hearing Screening - Comments:: No aids Vision Screening - Comments:: Wears Glasses - [facility]  Dietary issues and exercise activities discussed: Current Exercise Habits: Home exercise routine, Type of exercise: walking, Time (Minutes): 20, Frequency (Times/Week): 4, Weekly Exercise (Minutes/Week): 80, Exercise limited by: orthopedic condition(s) (difficult to walk long distance)   Goals Addressed             This Visit's Progress    Patient Stated       No new goals.       Depression Screen    07/13/2022    2:18 PM 07/05/2021    2:02 PM 05/07/2020    2:03 PM 01/09/2019   11:01 AM 10/02/2017   10:18 AM 09/08/2016    8:56 AM 08/06/2014   10:13 AM  PHQ 2/9 Scores  PHQ - 2 Score 0 0  0 0 0 0 0  PHQ- 9 Score    0       Fall Risk    07/13/2022    2:09 PM 07/05/2021    2:01 PM 05/07/2020    2:03 PM 01/09/2019   11:01 AM 11/27/2017    9:07 AM  [place] in the past year? 1 0 0 0 No  Comment     Emmi Telephone Survey: data to providers prior to load  Number falls in past yr: 0 0  0   Comment Tripped over cord      Injury with Fall? 1 0     Comment Punctured lung and fractured rib      Risk for fall due to :  No Fall Risks  Medication side effect   Follow up Falls evaluation completed;Education provided;Falls prevention discussed Falls prevention discussed  Falls  evaluation completed;Falls prevention discussed     FALL RISK PREVENTION PERTAINING TO THE HOME:  Any stairs in or around the home? Yes  If so, are there any without handrails? No  Home free of loose throw rugs in walkways, pet beds, electrical cords, etc? Yes  Adequate lighting in your home to reduce risk of falls? Yes   ASSISTIVE DEVICES UTILIZED TO PREVENT FALLS:  Life alert? No  Use of a cane, walker or w/c? Yes  Grab bars in the bathroom? Yes  Shower chair or bench in shower? Yes  Elevated toilet seat or a handicapped toilet? No    Cognitive Function:    01/09/2019   11:04 AM 09/08/2016    8:56 AM  MMSE - Mini Mental State Exam  Orientation to time 5 5  Orientation to Place 5 5  Registration 3 3  Attention/ Calculation 5 0  Recall 2 3  Language- name 2 objects  0  Language- repeat 1 1  Language- follow 3 step command  3  Language- read & follow direction  0  Write a sentence  0  Copy design  0  Total score  20        07/13/2022    2:26 PM  6CIT Screen  What Year? 0 points  What month? 0 points  What time? 0 points  Count back from 20 0 points  Months in reverse 0 points  Repeat phrase 2 points  Total Score 2 points    Immunizations Immunization History  Administered Date(s) Administered   PFIZER(Purple Top)SARS-COV-2 Vaccination 05/01/2019, 05/22/2019, 02/10/2020   Pneumococcal Conjugate-13 02/20/2014   Pneumococcal Polysaccharide-23 04/11/2008   Td 09/07/2006   Tdap 02/06/2022    TDAP status: Up to date  Flu Vaccine status: Declined, Education has been provided regarding the importance of this vaccine but patient still declined. Advised may receive this vaccine at local pharmacy or Health Dept. Aware to provide a copy of the vaccination record if obtained from local pharmacy or Health Dept. Verbalized acceptance and understanding.  Pneumococcal vaccine status: Up to date  Covid-19 vaccine status: Information provided on how to obtain vaccines.    Qualifies for Shingles Vaccine?  declined   Zostavax completed No   Shingrix Completed?: No.    Education has been provided regarding the importance of this vaccine. Patient has been advised to call insurance company to determine out of pocket expense if they have not yet received this vaccine. Advised may also receive vaccine at local pharmacy or Health Dept. Verbalized acceptance and understanding.  Screening Tests Health Maintenance  Topic Date Due  Zoster Vaccines- Shingrix (1 of 2) Never done   COVID-19 Vaccine (4 - 2023-24 season) 12/24/2021   INFLUENZA VACCINE  07/24/2022 (Originally 11/23/2021)   Medicare Annual Wellness (AWV)  07/13/2023   DTaP/Tdap/Td (3 - Td or Tdap) 02/07/2032   Pneumonia Vaccine 75+ Years old  Completed   HPV VACCINES  Aged Out   Lung Cancer Screening  Discontinued    Health Maintenance  Health Maintenance Due  Topic Date Due   Zoster Vaccines- Shingrix (1 of 2) Never done   COVID-19 Vaccine (4 - 2023-24 season) 12/24/2021    Colorectal cancer screening: No longer required.   Lung Cancer Screening: (Low Dose CT Chest recommended if Age 79-80 years, 30 pack-year currently smoking OR have quit w/in 15years.) does not qualify.   Lung Cancer Screening Referral: no  Additional Screening:  Hepatitis C Screening: does not qualify; Completed no  Vision Screening: Recommended annual ophthalmology exams for early detection of glaucoma and other disorders of the eye. Is the patient up to date with their annual eye exam?  Yes  Who is the provider or what is the name of the office in which the patient attends annual eye exams? [facility] If pt is not established with a provider, would they like to be referred to a provider to establish care? Yes .   Dental Screening: Recommended annual dental exams for proper oral hygiene  Community Resource Referral / Chronic Care Management: CRR required this visit?  No   CCM required this visit?  No      Plan:      I have personally reviewed and noted the following in the patient's chart:   Medical and social history Use of alcohol, tobacco or illicit drugs  Current medications and supplements including opioid prescriptions. Patient is not currently taking opioid prescriptions. Functional ability and status Nutritional status Physical activity Advanced directives List of other physicians Hospitalizations, surgeries, and ER visits in previous 12 months Vitals Screenings to include cognitive, depression, and falls Referrals and appointments  In addition, I have reviewed and discussed with patient certain preventive protocols, quality metrics, and best practice recommendations. A written personalized care plan for preventive services as well as general preventive health recommendations were provided to patient.     Lebron Conners, LPN   [id number]   Nurse Notes: Pt declined flu, covid, and shingles vaccinations.

## 2022-07-19 ENCOUNTER — Ambulatory Visit: Payer: Medicare Other

## 2022-07-21 ENCOUNTER — Ambulatory Visit (INDEPENDENT_AMBULATORY_CARE_PROVIDER_SITE_OTHER): Payer: Medicare Other

## 2022-07-21 ENCOUNTER — Telehealth: Payer: Self-pay

## 2022-07-21 DIAGNOSIS — Z7901 Long term (current) use of anticoagulants: Secondary | ICD-10-CM

## 2022-07-21 LAB — POCT INR: INR: 8 — AB (ref 2.0–3.0)

## 2022-07-21 LAB — PROTIME-INR
INR: 11.5 ratio (ref 0.8–1.0)
Prothrombin Time: 107.9 s (ref 9.6–13.1)

## 2022-07-21 MED ORDER — PHYTONADIONE 5 MG PO TABS: ORAL_TABLET | ORAL | 0 refills | Status: DC

## 2022-07-21 NOTE — Progress Notes (Addendum)
Started jardiance.  Pt has been drinking a lot of cran-apple juice the last 2 weeks. Pt denies any bleeding or abnormal bruising. Also stopped taking Pletal.  Hold warfarin until recheck on 4/2. If any abnormal bruising or bleeding contact the office or go to the ER.   Lab INR drawn today STAT. Will f/u with pt later today with result and any further instructions.   Lab INR back and is 11.5 Discussed with Dr Diona Browner, and prescription for vitamin K tablet, 5mg , take 1/2 tablet now and hold other half. Retest on 4/2.   Contacted pt's wife, Joycelyn Schmid, and advised of prescription. Take 1/2 tablet and hold other half. Advised if any s/s of abnormal bruising or bleeding to go to ER. Scheduled pt on lab schedule for 4/2 to have INR checked. [name] verbalized understanding.  Sent in script for vitamin K to [facility], only pharmacy in [place] that carries vitamin K, 5 mg.  [name] aware of script.

## 2022-07-21 NOTE — Telephone Encounter (Signed)
I agree and thanks to Ms. Branham for her help with this case and nice patient.

## 2022-07-21 NOTE — Telephone Encounter (Signed)
[id number] with [facility] lab called critical report on INR 11.5 and PT 107.9 . Report is in epic also; sending note to Randall An RN who is in office today. Will also let Larene Beach RN know. Logging in lab notebook.

## 2022-07-21 NOTE — Patient Instructions (Addendum)
Pre visit review using our clinic review tool, if applicable. No additional management support is needed unless otherwise documented below in the visit note.  Hold warfarin until recheck on 4/2. If any abnormal bruising or bleeding contact the office or go to the ER.

## 2022-07-21 NOTE — Telephone Encounter (Signed)
Lab INR back and is 11.5 Discussed with Dr Diona Browner, and prescription for vitamin K tablet, 5mg , take 1/2 tablet now and hold other half. Retest on 4/2.   Contacted pt's wife, [name], and advised of prescription. Take 1/2 tablet and hold other half. Advised if any s/s of abnormal bruising or bleeding to go to ER. Scheduled pt on lab schedule for 4/2 to have INR checked. [name] verbalized understanding.  Sent in script for vitamin K to [facility], only pharmacy in [place] that carries vitamin K, 5 mg.  [name] aware of script.

## 2022-07-26 ENCOUNTER — Ambulatory Visit (INDEPENDENT_AMBULATORY_CARE_PROVIDER_SITE_OTHER): Payer: Medicare Other

## 2022-07-26 ENCOUNTER — Other Ambulatory Visit: Payer: Medicare Other

## 2022-07-26 ENCOUNTER — Other Ambulatory Visit: Payer: Self-pay | Admitting: Family Medicine

## 2022-07-26 DIAGNOSIS — E038 Other specified hypothyroidism: Secondary | ICD-10-CM

## 2022-07-26 DIAGNOSIS — E782 Mixed hyperlipidemia: Secondary | ICD-10-CM

## 2022-07-26 DIAGNOSIS — N1831 Chronic kidney disease, stage 3a: Secondary | ICD-10-CM

## 2022-07-26 DIAGNOSIS — Z7901 Long term (current) use of anticoagulants: Secondary | ICD-10-CM

## 2022-07-26 DIAGNOSIS — Z79899 Other long term (current) drug therapy: Secondary | ICD-10-CM

## 2022-07-26 DIAGNOSIS — R739 Hyperglycemia, unspecified: Secondary | ICD-10-CM

## 2022-07-26 LAB — POCT INR: INR: 1.7 — AB (ref 2.0–3.0)

## 2022-07-26 NOTE — Patient Instructions (Addendum)
Pre visit review using our clinic review tool, if applicable. No additional management support is needed unless otherwise documented below in the visit note.  Restart warfarin today at normal dosing. Take 1/2 tablet (2.5 mg) today and tomorrow and recheck INR at appointment on 4/4 at 11:00 am at [facility].

## 2022-07-26 NOTE — Progress Notes (Signed)
Pt in coumadin clinic on 3/28 and INR was 11.5. Prescribed 2.5 mg vitamin K and hold warfarin until INR check today. Pt had a fall on 3/31 and EMTs were called. Pt refused to go to ER. Pt denies any injuries but reports he is sore and facial bruising. He did not go to ER. Unsure of the cause of the elevated INR last week. Will restart warfarin today at prior dosing he was taking and recheck on 4/4 to continue close observation of INR due to inability to pinpoint exactly what had caused the supratherapeutic INR.  Contacted pt by phone and advised to restart prior dosing or warfarin, 1/2 tablet daily except 1 tablet on Mondays. Advised should recheck INR in 2 days at [facility] to monitor elevation. Assumed INR would be lower than 1.7 today given pt held warfarin for 5 days and took 2.5 mg of vitamin K. Advised close monitoring would be needed for the near future. Pt agreed and was scheduled for 4/4. Advised if any s/s of abnormal bruising or bleeding contact the office or go to the ER.

## 2022-07-26 NOTE — Telephone Encounter (Signed)
Pt in coumadin clinic on 3/28 and INR was 11.5. Prescribed 2.5 mg vitamin K and hold warfarin until INR check today. Pt had a fall on 3/31 and EMTs were called. Pt refused to go to ER and denies any injuries but reports he is sore and has facial bruising.  Unsure of the cause of the elevated INR last week. Will restart warfarin today at prior dosing he was taking and recheck on 4/4 to continue close observation of INR due to inability to pinpoint exactly what had caused the supratherapeutic INR.  Contacted pt by phone and advised to restart prior dosing or warfarin, 1/2 tablet daily except 1 tablet on Mondays. Advised should recheck INR in 2 days at [facility] to monitor elevation. Assumed INR would be lower than 1.7 today given pt held warfarin for 5 days and took 2.5 mg of vitamin K. Advised close monitoring would be needed for the near future. Pt agreed and was scheduled for 4/4. Advised if any s/s of abnormal bruising or bleeding contact the office or go to the ER.

## 2022-07-28 ENCOUNTER — Ambulatory Visit (INDEPENDENT_AMBULATORY_CARE_PROVIDER_SITE_OTHER): Payer: Medicare Other

## 2022-07-28 DIAGNOSIS — Z7901 Long term (current) use of anticoagulants: Secondary | ICD-10-CM

## 2022-07-28 LAB — POCT INR: INR: 1.7 — AB (ref 2.0–3.0)

## 2022-07-28 NOTE — Patient Instructions (Addendum)
Pre visit review using our clinic review tool, if applicable. No additional management support is needed unless otherwise documented below in the visit note.  Increase dose today to take 1 tablet and then continue 1/2 tablet daily except take 1 tablet on Mondays. Recheck in 1 week.

## 2022-07-28 NOTE — Progress Notes (Signed)
Pt in coumadin clinic on 3/28 and INR was 11.5. Prescribed 2.5 mg vitamin K and hold warfarin until INR check today. Pt had a fall on 3/31 and EMTs were called. Pt refused to go to ER. Pt denies any injuries but reports he is sore and facial bruising. He did not go to ER. INR on 4/2 was 1.7. On 4/2 pt was advised to continue normal dosing.  Increase dose today to take 1 tablet and then continue 1/2 tablet daily except take 1 tablet on Mondays. Recheck in 1 week.

## 2022-08-04 ENCOUNTER — Ambulatory Visit (INDEPENDENT_AMBULATORY_CARE_PROVIDER_SITE_OTHER): Payer: Medicare Other

## 2022-08-04 ENCOUNTER — Telehealth: Payer: Self-pay

## 2022-08-04 ENCOUNTER — Other Ambulatory Visit (INDEPENDENT_AMBULATORY_CARE_PROVIDER_SITE_OTHER): Payer: Medicare Other

## 2022-08-04 DIAGNOSIS — E038 Other specified hypothyroidism: Secondary | ICD-10-CM | POA: Diagnosis not present

## 2022-08-04 DIAGNOSIS — N1831 Chronic kidney disease, stage 3a: Secondary | ICD-10-CM

## 2022-08-04 DIAGNOSIS — Z7901 Long term (current) use of anticoagulants: Secondary | ICD-10-CM | POA: Diagnosis not present

## 2022-08-04 DIAGNOSIS — E782 Mixed hyperlipidemia: Secondary | ICD-10-CM | POA: Diagnosis not present

## 2022-08-04 DIAGNOSIS — Z79899 Other long term (current) drug therapy: Secondary | ICD-10-CM

## 2022-08-04 DIAGNOSIS — R739 Hyperglycemia, unspecified: Secondary | ICD-10-CM | POA: Diagnosis not present

## 2022-08-04 LAB — CBC WITH DIFFERENTIAL/PLATELET
Basophils Absolute: 0.1 10*3/uL (ref 0.0–0.1)
Basophils Relative: 1.9 % (ref 0.0–3.0)
Eosinophils Absolute: 0.3 10*3/uL (ref 0.0–0.7)
Eosinophils Relative: 4.4 % (ref 0.0–5.0)
HCT: 43.8 % (ref 39.0–52.0)
Hemoglobin: 14.2 g/dL (ref 13.0–17.0)
Lymphocytes Relative: 18.5 % (ref 12.0–46.0)
Lymphs Abs: 1.1 10*3/uL (ref 0.7–4.0)
MCHC: 32.4 g/dL (ref 30.0–36.0)
MCV: 84.2 fl (ref 78.0–100.0)
Monocytes Absolute: 0.6 10*3/uL (ref 0.1–1.0)
Monocytes Relative: 9 % (ref 3.0–12.0)
Neutro Abs: 4.1 10*3/uL (ref 1.4–7.7)
Neutrophils Relative %: 66.2 % (ref 43.0–77.0)
Platelets: 283 10*3/uL (ref 150.0–400.0)
RBC: 5.2 Mil/uL (ref 4.22–5.81)
RDW: 20 % — ABNORMAL HIGH (ref 11.5–15.5)
WBC: 6.2 10*3/uL (ref 4.0–10.5)

## 2022-08-04 LAB — BASIC METABOLIC PANEL
BUN: 33 mg/dL — ABNORMAL HIGH (ref 6–23)
CO2: 29 mEq/L (ref 19–32)
Calcium: 9.2 mg/dL (ref 8.4–10.5)
Chloride: 102 mEq/L (ref 96–112)
Creatinine, Ser: 2.03 mg/dL — ABNORMAL HIGH (ref 0.40–1.50)
GFR: 30.25 mL/min — ABNORMAL LOW (ref >60.00)
Glucose, Bld: 76 mg/dL (ref 70–99)
Potassium: 4.5 mEq/L (ref 3.5–5.1)
Sodium: 140 mEq/L (ref 135–145)

## 2022-08-04 LAB — HEPATIC FUNCTION PANEL
ALT: 13 U/L (ref 0–53)
AST: 14 U/L (ref 0–37)
Albumin: 3.8 g/dL (ref 3.5–5.2)
Alkaline Phosphatase: 101 U/L (ref 39–117)
Bilirubin, Direct: 0.2 mg/dL (ref 0.0–0.3)
Total Bilirubin: 0.6 mg/dL (ref 0.2–1.2)
Total Protein: 6.8 g/dL (ref 6.0–8.3)

## 2022-08-04 LAB — TSH: TSH: 0.74 u[IU]/mL (ref 0.35–5.50)

## 2022-08-04 LAB — LIPID PANEL
Cholesterol: 130 mg/dL (ref 0–200)
HDL: 61.2 mg/dL (ref >39.00)
LDL Cholesterol: 54 mg/dL (ref 0–99)
NonHDL: 68.8
Total CHOL/HDL Ratio: 2
Triglycerides: 76 mg/dL (ref 0.0–149.0)
VLDL: 15.2 mg/dL (ref 0.0–40.0)

## 2022-08-04 LAB — PROTIME-INR
INR: 10.2 ratio (ref 0.8–1.0)
Prothrombin Time: 97.1 s (ref 9.6–13.1)

## 2022-08-04 LAB — HEMOGLOBIN A1C: Hgb A1c MFr Bld: 5.7 % (ref 4.6–6.5)

## 2022-08-04 LAB — T4, FREE: Free T4: 2.46 ng/dL — ABNORMAL HIGH (ref 0.60–1.60)

## 2022-08-04 LAB — POCT INR: INR: 8 — AB (ref 2.0–3.0)

## 2022-08-04 LAB — T3, FREE: T3, Free: 2 pg/mL — ABNORMAL LOW (ref 2.3–4.2)

## 2022-08-04 NOTE — Telephone Encounter (Signed)
Pt in coumadin clinic this morning and POCT INR was >8.0, after taking 1 week of prior dosing. Pt has been stable on the prior dosing for several months with little adjustment needed until he was in the coumadin clinic 2 weeks ago and POCT INR was >8 at that time and lab INR returned as 11.5  Lab INR was drawn this morning and has returned as 10.2 Pt was prescribed a 5 mg vitamin K tablet, to take 2.5 mg, two weeks ago when he was supra-therapeutic. Pt has the other half of the 5 mg vitamin K tablet.  Contacted pt and advised to take the 1/2 tablet (2.5 mg) of vitamin K and hold warfarin until recheck of INR on 4/15. Pt has a lab apt and a POCT INR will be performed on 4/15. Advised pt if any s/s of abnormal bruising or bleeding to call 911 or go to the ER. Pt verbalized understanding.

## 2022-08-04 NOTE — Progress Notes (Signed)
Pt in coumadin clinic this morning and POCT INR was >8.0, after taking 1 week of prior dosing. Pt has been stable on the prior dosing for several months with little adjustment needed until he was in the coumadin clinic 2 weeks ago and POCT INR was >8 at that time and lab INR returned as 11.5  Lab INR was drawn this morning and has returned as 10.2 Pt was prescribed a 5 mg vitamin K tablet, to take 2.5 mg, two weeks ago when he was supra-therapeutic. Pt has the other half of the 5 mg vitamin K tablet.  Contacted pt and advised to take the 1/2 tablet (2.5 mg) of vitamin K and hold warfarin until recheck of INR on 4/15. Pt has a lab apt and a POCT INR will be performed on 4/15. Advised pt if any s/s of abnormal bruising or bleeding to call 911 or go to the ER. Pt verbalized understanding.   Sent msg to PCP also.

## 2022-08-04 NOTE — Telephone Encounter (Signed)
All reviewed, and I appreciate Ms. Eddie North care and help.

## 2022-08-04 NOTE — Patient Instructions (Addendum)
Pre visit review using our clinic review tool, if applicable. No additional management support is needed unless otherwise documented below in the visit note.  Do not take any warfarin until further instructed. If any signs or symptoms of bleeding or abnormal bruising call 911 or go to the ER.

## 2022-08-04 NOTE — Telephone Encounter (Signed)
[name] with [facility] called critical lab on PT 97.1 and INR 10.2.  sending note to Sherrie George RN and Dr Ermalene Searing who ordered test. [name] said PT/INR results will not be in epic until other labs are reported. Sending note to Sherrie George RN and Dr Ermalene Searing and [facility]. Logged in lab notebook.

## 2022-08-04 NOTE — Progress Notes (Signed)
Pt denies any changes except he has stopped drinking cran-apple juice. Pt denies any s/s of bleeding or abnormal bruising. Advised if any s/s of bleeding or abnormal bruising call 911 or go to the ER. Pt verbalized understanding.  POCT INR today is >8. Lab INR drawn today. Will contact pt with further dosing instructions when result is back.  Do not take any warfarin until further instructed.

## 2022-08-08 ENCOUNTER — Other Ambulatory Visit: Payer: Medicare Other

## 2022-08-08 ENCOUNTER — Ambulatory Visit (INDEPENDENT_AMBULATORY_CARE_PROVIDER_SITE_OTHER): Payer: Medicare Other

## 2022-08-08 ENCOUNTER — Telehealth: Payer: Self-pay

## 2022-08-08 DIAGNOSIS — Z7901 Long term (current) use of anticoagulants: Secondary | ICD-10-CM

## 2022-08-08 DIAGNOSIS — R300 Dysuria: Secondary | ICD-10-CM

## 2022-08-08 LAB — POCT INR
INR: 1.6 — AB (ref 2.0–3.0)
INR: 1.7 — AB (ref 2.0–3.0)

## 2022-08-08 NOTE — Telephone Encounter (Signed)
INR today 1.6 after a 4 day warfarin hold and taking 2.5 mg vitamin K tablet 4 days ago,after 10.2 INR on 4/11.  Reviewing labs shows increased creatinine from 1.63 mg/dL two months ago to currently 2.03 mg/dL.  CKD can effect warfarin management and could be the cause of recent hypertherapeutic INRs. No other causes have been identified. Per pt and pt's wife, no changes in medication, OTC, diet or any other changes.   Will reduce warfarin dose by 25% and recheck INR in 3 days, 4/18. Pt was contacted with dosing instructions and denies any bleeding. Pt was advised to go to ER if any s/s of bleeding. Pt verbalized understanding.

## 2022-08-08 NOTE — Telephone Encounter (Signed)
Spoke with Mrs. Laurance.  Arnette will stop by office to collect UA tomorrow.

## 2022-08-08 NOTE — Telephone Encounter (Signed)
INR checked today, was 1.6, and pt was given dosing instructions and will have recheck INR on 4/18.    Pt reported worsening dysuria, urgency, frequency, and cloudy urine. Pt reports when hospitalized in Oct 2023 he was prescribed a condom cath. He reports urgency and frequency since then but new symptoms of cloudy urine and dysuria over the last week which is worsening. Advised pt a msg would be sent to PCP to inquire if he would like him to come in to leave a urine sample to check for UTI. Pt denies any hematuria.  Advised if any changes or any hematuria to contact the office or go to the ER. Advised pt the office will f/u with him. Pt verbalized understanding.

## 2022-08-08 NOTE — Patient Instructions (Signed)
Pre visit review using our clinic review tool, if applicable. No additional management support is needed unless otherwise documented below in the visit note. 

## 2022-08-08 NOTE — Telephone Encounter (Signed)
Can we get him to drop off a urine for UA, micro, and culture? He has an appointment with me later in the week.

## 2022-08-08 NOTE — Progress Notes (Signed)
INR on 4/11 was again was supratherapeutic at 10.2.  Advised at apt on 4/11 to hold all warfarin. Received lab INR that afternoon and pt was advised to take 2.5 mg vitamin K. Pt denies any bleeding. Supratherapeutic INR has happened at last two coumadin clinic apt. At last elevated INR pt held for 6 days and then was restarted on normal dosing.  Will decrease weekly dosing significantly (25%) for pt to start new dosing today. Will recheck INR on 4/18.  Pt denies any abnormal bruising or bleeding. Advised if any s/s of abnormal bruising or bleeding to contact office or call 911 or go to the ER. Pt verbalized understanding.  Change weekly dose to take 1/2 tablet daily except take nothing on Saturdays. Recheck on 4/18 at [facility]. Pt also has apt with PCP. Contacted pt by phone and advised of dosing and recheck. Pt verbalized understanding.  Pt reported UTI symptoms. Sent a msg to PCP.

## 2022-08-09 ENCOUNTER — Other Ambulatory Visit: Payer: Self-pay | Admitting: Cardiovascular Disease

## 2022-08-09 ENCOUNTER — Other Ambulatory Visit: Payer: Medicare Other

## 2022-08-09 ENCOUNTER — Ambulatory Visit: Payer: Medicare Other | Attending: Nurse Practitioner

## 2022-08-09 DIAGNOSIS — I739 Peripheral vascular disease, unspecified: Secondary | ICD-10-CM

## 2022-08-09 DIAGNOSIS — R300 Dysuria: Secondary | ICD-10-CM | POA: Diagnosis not present

## 2022-08-09 LAB — VAS US ABI WITH/WO TBI: Left ABI: 0.81

## 2022-08-10 LAB — VAS US ABI WITH/WO TBI: Right ABI: 0.66

## 2022-08-10 NOTE — Progress Notes (Signed)
[name] Schuur T. Marvell Stavola, MD, CAQ Sports Medicine [facility] at [facility] [address] [place], [address]  Phone: [phone]  FAX: [phone]  BION TODOROV - 82 y.o. male  MRN [id number]  Date of Birth: 24-Jul-1940  Date: 08/11/2022  PCP: [name], MD  Referral: [name], MD  Chief Complaint  Patient presents with   Annual Exam    Part 2   Subjective:   [name] is a 82 y.o. very pleasant male patient with Body mass index is 24.04 kg/m. who presents with the following:  The patient is here to follow-up on multiple medical problems after his Medicare wellness visit last month.  He does have significant coronary disease, COPD, relatively advanced heart failure, significant peripheral arterial disease, aortic aneurysm, bilateral renal artery stenosis, hypertension, hypothyroidism.  Hyperlipidemia.    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  Longstanding smoking history for 60 years, in remission. He also has mild aortic stenosis.  He had very severe heart failure in the fall with a recent TEE showing EF 40 to 45% with significant chronic diastolic heart failure.  He had a massive fluid overload in December and diuresed 15 to 20 pounds.  He is currently being followed by Dr. Kirke Corin and Dr. Gala Romney.  UTI: Urinating about every 15 minutes.  Always had to go a lot, but more now.   Same breathing.   Trouble going up a flight of stairs - feels like he is going to fall a lot.  Losing his balance a lot.   Dr. Kirke Corin took him off of Pletal.   HTN: Tolerating all medications without side effects Stable and at goal No CP, no sob. No HA.  BP Readings from Last 3 Encounters:  08/11/22 100/60  06/30/22 (!) 116/46  06/20/22 (!) 120/52    Basic Metabolic Panel:    Component Value Date/Time   NA 140 08/04/2022 0912   NA 142 05/06/2021 0953    NA 136 06/03/2013 0454   K 4.5 08/04/2022 0912   K 4.1 06/03/2013 0454   CL 102 08/04/2022 0912   CL 106 06/03/2013 0454   CO2 29 08/04/2022 0912   CO2 27 06/03/2013 0454   BUN 33 (H) 08/04/2022 0912   BUN 22 05/06/2021 0953   BUN 22 (H) 06/03/2013 0454   CREATININE 2.03 (H) 08/04/2022 0912   CREATININE 1.59 (H) 06/03/2013 0454   CREATININE 1.02 04/22/2011 1507   GLUCOSE 76 08/04/2022 0912   GLUCOSE 80 06/03/2013 0454   CALCIUM 9.2 08/04/2022 0912   CALCIUM 8.8 06/03/2013 0454    Thyroid: No symptoms. Labs reviewed. Denies cold / heat intolerance, dry skin, hair loss. No goiter.  Lab Results  Component Value Date   TSH 0.74 08/04/2022     Review of Systems is noted in the HPI, as appropriate  Objective:   BP 100/60   Pulse (!) 56   Temp 97.7 F (36.5 C) (Temporal)   Ht 5' 8.25" (1.734 m)   Wt 159 lb 4 oz (72.2 kg)   SpO2 96%   BMI 24.04 kg/m   Ideal Body Weight: Weight in (lb) to have BMI = 25: 165.3 No results found.    07/13/2022    2:18 PM 07/05/2021    2:02 PM 05/07/2020    2:03 PM 01/09/2019   11:01 AM 10/02/2017   10:18 AM  Depression screen PHQ 2/9  Decreased Interest 0 0 0 0 0  Down, Depressed, Hopeless 0 0 0 0 0  PHQ - 2 Score 0 0 0 0 0  Altered sleeping    0   Tired, decreased energy    0   Change in appetite    0   Feeling bad or failure about yourself     0   Trouble concentrating    0   Moving slowly or fidgety/restless    0   Suicidal thoughts    0   PHQ-9 Score    0   Difficult doing work/chores    Not difficult at all      GEN: well developed, well nourished, no acute distress Eyes: conjunctiva and lids normal, PERRLA, EOMI ENT: TM clear, nares clear, oral exam WNL Neck: supple, no lymphadenopathy, no thyromegaly, no JVD Pulm: clear to auscultation and percussion, respiratory effort normal CV: regular rate and rhythm, S1-S2, no murmur, rub or gallop, no bruits, peripheral pulses normal and symmetric, no cyanosis, clubbing, edema or  varicosities Chest: no scars, masses GI: soft, non-tender; no hepatosplenomegaly, masses; active bowel sounds all quadrants GU: defer Lymph: no cervical, axillary or inguinal adenopathy MSK: gait normal, muscle tone and strength WNL, no joint swelling, effusions, discoloration, crepitus  SKIN: clear, good turgor, color WNL, no rashes, lesions, or ulcerations Neuro: normal mental status, normal strength, sensation, and motion Psych: alert; oriented to person, place and time, normally interactive and not anxious or depressed in appearance.   Laboratory and Imaging Data: Lab Review:     Latest Ref Rng & Units 08/04/2022    9:12 AM 06/07/2022   12:22 PM 05/10/2022    2:40 PM  CBC EXTENDED  WBC 4.0 - 10.5 K/uL 6.2  5.7  8.7   RBC 4.22 - 5.81 Mil/uL 5.20  4.45  5.00   Hemoglobin 13.0 - 17.0 g/dL 69.6  29.5  28.4   HCT 39.0 - 52.0 % 43.8  39.3  44.3   Platelets 150.0 - 400.0 K/uL 283.0  326  324   NEUT# 1.4 - 7.7 K/uL 4.1     Lymph# 0.7 - 4.0 K/uL 1.1          Latest Ref Rng & Units 08/04/2022    9:12 AM 06/20/2022   11:40 AM 06/07/2022   12:22 PM  BMP  Glucose 70 - 99 mg/dL 76  81  69   BUN 6 - 23 mg/dL 33  25  26   Creatinine 0.40 - 1.50 mg/dL 1.32  4.40  1.02   Sodium 135 - 145 mEq/L 140  141  140   Potassium 3.5 - 5.1 mEq/L 4.5  3.7  4.4   Chloride 96 - 112 mEq/L 102  102  105   CO2 19 - 32 mEq/L 29  29  26    Calcium 8.4 - 10.5 mg/dL 9.2  8.6  8.9        Latest Ref Rng & Units 08/04/2022    9:12 AM 06/07/2022   12:22 PM 03/23/2022   12:58 PM  Hepatic Function  Total Protein 6.0 - 8.3 g/dL 6.8  7.0  7.0   Albumin 3.5 - 5.2 g/dL 3.8  3.4  3.3   AST 0 - 37 U/L 14  15  14    ALT 0 - 53 U/L 13  11  13    Alk Phosphatase 39 - 117 U/L 101  72  67   Total Bilirubin 0.2 - 1.2 mg/dL 0.6  0.8  0.8   Bilirubin, Direct 0.0 - 0.3 mg/dL 0.2   0.2     Lab Results  Component Value Date   CHOL 130 08/04/2022   Lab Results  Component Value Date   HDL 61.20 08/04/2022   Lab Results   Component Value Date   LDLCALC 54 08/04/2022   Lab Results  Component Value Date   TRIG 76.0 08/04/2022   Lab Results  Component Value Date   CHOLHDL 2 08/04/2022   No results for input(s): "PSA" in the last 72 hours. No results found for: "HCVAB" No results found for: "VD25OH"   Lab Results  Component Value Date   HGBA1C 5.7 08/04/2022   HGBA1C 5.5 07/05/2021   HGBA1C 5.3 04/30/2020   Lab Results  Component Value Date   LDLCALC 54 08/04/2022   CREATININE 2.03 (H) 08/04/2022    Urine culture: + Sepaccia  Assessment and Plan:     ICD-10-CM   1. Other specified hypothyroidism  E03.8 Stable    2. Primary hypertension  I10 Stable    3. Coronary artery disease involving native coronary artery of native heart without angina pectoris  I25.10 Stable and no chest pain    4. Stage 3a chronic kidney disease  N18.31 Slightly worsening compared to recently, but he has not had heavy diuretic use with his congestive heart failure.    5. Other emphysema  J43.8 Increased work of breathing and some shortness of breath, unclear if this is pulmonary or congestive heart failure related.  I tend to think this is CHF related.  He has not wanted to use regular inhalers.    6. Ischemic cardiomyopathy  I25.5 Being followed by cardiology in the advanced heart failure clinic he has had quite a bit of problems recently.    7. Peripheral arterial disease  I73.9     8. Acute lower UTI  N39.0 Acute positive UTI, initially placed him on Keflex, then I change this to Septra given his sensitivities.      Medication Management during today's office visit: Meds ordered this encounter  Medications   cephALEXin (KEFLEX) 500 MG capsule    Sig: Take 1 capsule (500 mg total) by mouth 2 (two) times daily.    Dispense:  14 capsule    Refill:  0   Medications Discontinued During This Encounter  Medication Reason   empagliflozin (JARDIANCE) 10 MG TABS tablet Duplicate   tamsulosin (FLOMAX) 0.4 MG  CAPS capsule     Orders placed today for conditions managed today: No orders of the defined types were placed in this encounter.   Disposition: Return in about 6 months (around 02/10/2023) for Dr. Patsy Lager.  Dr[employer] One speech-to-text software was used for transcription in this dictation.  Possible transcriptional errors can occur using Animal nutritionist.   Signed,  Elpidio Galea. Nattaly Yebra, MD   Outpatient Encounter Medications as of 08/11/2022  Medication Sig   albuterol (VENTOLIN HFA) 108 (90 Base) MCG/ACT inhaler Inhale 2 puffs into the lungs every 6 (six) hours as needed for wheezing or shortness of breath.   amiodarone (PACERONE) 200 MG tablet Take 1 tablet (200 mg total) by mouth 2 (two) times daily.   atorvastatin (LIPITOR) 20 MG tablet TAKE 1 TABLET BY MOUTH EVERY DAY   augmented betamethasone dipropionate (DIPROLENE-AF) 0.05 % cream APPLY TO AFFECTED AREA TWICE A DAY   cephALEXin (KEFLEX) 500 MG capsule Take 1 capsule (500 mg total) by mouth 2 (two) times daily.   empagliflozin (JARDIANCE) 10 MG TABS  tablet Take 1 tablet (10 mg total) by mouth daily before breakfast.   furosemide (LASIX) 40 MG tablet Take 1 tablet (40 mg total) by mouth 2 (two) times daily.   levothyroxine (SYNTHROID) 150 MCG tablet TAKE ONE TABLET BY MOUTH EVERY DAY BEFORE BREAKFAST   metoprolol succinate (TOPROL XL) 25 MG 24 hr tablet Take 1 tablet (25 mg total) by mouth 2 (two) times daily.   phytonadione (MEPHYTON) 5 MG tablet TAKE 1/2 TABLET BY MOUTH AS DIRECTED BY ANTICOAGULATION CLINIC   warfarin (COUMADIN) 5 MG tablet Take 2.5 mg by mouth daily. 1/2 tab daily except 1 tab on mon,wed,frid   [DISCONTINUED] tamsulosin (FLOMAX) 0.4 MG CAPS capsule Take 1 capsule (0.4 mg total) by mouth daily after supper.   [DISCONTINUED] empagliflozin (JARDIANCE) 10 MG TABS tablet Take 1 tablet (10 mg total) by mouth daily before breakfast.   No facility-administered encounter medications on file as of 08/11/2022.

## 2022-08-11 ENCOUNTER — Ambulatory Visit (INDEPENDENT_AMBULATORY_CARE_PROVIDER_SITE_OTHER): Payer: Medicare Other | Admitting: Family Medicine

## 2022-08-11 ENCOUNTER — Encounter: Payer: Self-pay | Admitting: Family Medicine

## 2022-08-11 ENCOUNTER — Ambulatory Visit (INDEPENDENT_AMBULATORY_CARE_PROVIDER_SITE_OTHER): Payer: Medicare Other

## 2022-08-11 VITALS — BP 100/60 | HR 56 | Temp 97.7°F | Ht 68.25 in | Wt 159.2 lb

## 2022-08-11 DIAGNOSIS — E038 Other specified hypothyroidism: Secondary | ICD-10-CM | POA: Diagnosis not present

## 2022-08-11 DIAGNOSIS — I2510 Atherosclerotic heart disease of native coronary artery without angina pectoris: Secondary | ICD-10-CM | POA: Diagnosis not present

## 2022-08-11 DIAGNOSIS — I10 Essential (primary) hypertension: Secondary | ICD-10-CM

## 2022-08-11 DIAGNOSIS — N390 Urinary tract infection, site not specified: Secondary | ICD-10-CM | POA: Diagnosis not present

## 2022-08-11 DIAGNOSIS — I739 Peripheral vascular disease, unspecified: Secondary | ICD-10-CM | POA: Diagnosis not present

## 2022-08-11 DIAGNOSIS — N1831 Chronic kidney disease, stage 3a: Secondary | ICD-10-CM | POA: Diagnosis not present

## 2022-08-11 DIAGNOSIS — I255 Ischemic cardiomyopathy: Secondary | ICD-10-CM | POA: Diagnosis not present

## 2022-08-11 DIAGNOSIS — Z7901 Long term (current) use of anticoagulants: Secondary | ICD-10-CM

## 2022-08-11 DIAGNOSIS — J438 Other emphysema: Secondary | ICD-10-CM

## 2022-08-11 LAB — URINE CULTURE
MICRO NUMBER:: 14831330
SPECIMEN QUALITY:: ADEQUATE

## 2022-08-11 LAB — POCT INR: INR: 2.1 (ref 2.0–3.0)

## 2022-08-11 MED ORDER — CEPHALEXIN 500 MG PO CAPS: 500.0000 mg | ORAL_CAPSULE | Freq: Two times a day (BID) | ORAL | 0 refills | Status: DC

## 2022-08-11 NOTE — Patient Instructions (Addendum)
Pre visit review using our clinic review tool, if applicable. No additional management support is needed unless otherwise documented below in the visit note.  Start 1/2 tablet daily except take nothing on Saturdays. Recheck in one week.

## 2022-08-11 NOTE — Patient Instructions (Signed)
Stop your TAMSULOSIN (FLOMAX)

## 2022-08-11 NOTE — Progress Notes (Addendum)
Pt also has apt with PCP today. Pt is in range today but will start new dosing of 1/2 tablet daily except take nothing on Saturdays. Recheck on 4/25 at [facility].

## 2022-08-12 LAB — URINE CULTURE

## 2022-08-13 MED ORDER — SULFAMETHOXAZOLE-TRIMETHOPRIM 800-160 MG PO TABS
1.0000 | ORAL_TABLET | Freq: Two times a day (BID) | ORAL | 0 refills | Status: DC
Start: 2022-08-13 — End: 2022-09-02

## 2022-08-13 NOTE — Addendum Note (Signed)
Addended by: [name] on: 08/13/2022 10:17 AM   Modules accepted: Orders

## 2022-08-13 NOTE — Progress Notes (Signed)
The patient's UTI is resistant to the Keflex that I placed him on.  Essentially all other abx interact with Coumadin.  I will place him on Bactrim.   I will alert Ms. Elpidio Anis, too.  He already has a Coumadin clinic appointment scheduled for 08/18/2022.    Results for orders placed or performed in visit on 08/09/22  Urine Culture   Specimen: Urine  Result Value Ref Range   MICRO NUMBER: [id number]    SPECIMEN QUALITY: Adequate    Sample Source URINE    STATUS: FINAL    ISOLATE 1: Serratia marcescens (A)       Susceptibility   Serratia marcescens - URINE CULTURE, REFLEX    AMOX/CLAVULANIC 16 Resistant     CEFAZOLIN* >=64 Resistant      * For uncomplicated UTI caused by E. coli, K. pneumoniae or P. mirabilis: Cefazolin is susceptible if MIC <32 mcg/mL and predicts susceptible to the oral agents cefaclor, cefdinir, cefpodoxime, cefprozil, cefuroxime, cephalexin and loracarbef.     CEFTAZIDIME <=1 Sensitive     CEFEPIME <=1 Sensitive     CEFTRIAXONE <=1 Sensitive     CIPROFLOXACIN <=0.25 Sensitive     LEVOFLOXACIN <=0.12 Sensitive     GENTAMICIN <=1 Sensitive     NITROFURANTOIN 256 Resistant     TOBRAMYCIN 2 Sensitive     TRIMETH/SULFA* <=20 Sensitive      * For uncomplicated UTI caused by E. coli, K. pneumoniae or P. mirabilis: Cefazolin is susceptible if MIC <32 mcg/mL and predicts susceptible to the oral agents cefaclor, cefdinir, cefpodoxime, cefprozil, cefuroxime, cephalexin and loracarbef. Legend: S = Susceptible  I = Intermediate R = Resistant  NS = Not susceptible * = Not tested  NR = Not reported **NN = See antimicrobic comments

## 2022-08-15 ENCOUNTER — Telehealth: Payer: Self-pay | Admitting: Family Medicine

## 2022-08-15 MED ORDER — FUROSEMIDE 40 MG PO TABS: 40.0000 mg | ORAL_TABLET | Freq: Two times a day (BID) | ORAL | 3 refills | Status: DC

## 2022-08-15 NOTE — Telephone Encounter (Addendum)
Left message for [name] that [name] needs to stop the Keflex and change over to the Bactrim-DS due to urine culture shows it is resistant to the Keflex.  Refill for Lasix sent to [facility] on [address].  I ask that she call me back if she has any questions.

## 2022-08-15 NOTE — Telephone Encounter (Signed)
Patients wife would like to know if patient is suppose to be taking cephALEXin (KEFLEX) 500 MG capsule  or  sulfamethoxazole-trimethoprim (BACTRIM DS) 800-160 MG tablet ?She [facility] that this new medication was sent in for him on 4/20 for his UTI and she wasn't aware that he was suppose to be taking a different medication. Please advise.    Also she would like to know if [facility] can fill his  furosemide (LASIX) 40 MG tablet for him?She said that him and the other doctor have both prescribed it to him,so it doesn't matter which one fills it.  [id number] [place], [place] - [address] Phone: [phone]  Fax: [phone]

## 2022-08-15 NOTE — Progress Notes (Signed)
Will monitor INR on 4/25 at coumadin clinic apt.

## 2022-08-16 ENCOUNTER — Encounter: Payer: Self-pay | Admitting: Internal Medicine

## 2022-08-16 ENCOUNTER — Ambulatory Visit (HOSPITAL_BASED_OUTPATIENT_CLINIC_OR_DEPARTMENT_OTHER): Payer: Medicare Other | Admitting: Internal Medicine

## 2022-08-16 ENCOUNTER — Other Ambulatory Visit
Admission: RE | Admit: 2022-08-16 | Discharge: 2022-08-16 | Disposition: A | Discharge status: Home / Self Care | Payer: Medicare Other | Source: Ambulatory Visit | Attending: Internal Medicine | Admitting: Internal Medicine

## 2022-08-16 ENCOUNTER — Other Ambulatory Visit (HOSPITAL_COMMUNITY): Payer: Self-pay

## 2022-08-16 VITALS — BP 114/62 | HR 60 | Wt 159.4 lb

## 2022-08-16 DIAGNOSIS — I480 Paroxysmal atrial fibrillation: Secondary | ICD-10-CM

## 2022-08-16 DIAGNOSIS — I350 Nonrheumatic aortic (valve) stenosis: Secondary | ICD-10-CM

## 2022-08-16 DIAGNOSIS — N184 Chronic kidney disease, stage 4 (severe): Secondary | ICD-10-CM

## 2022-08-16 DIAGNOSIS — I2510 Atherosclerotic heart disease of native coronary artery without angina pectoris: Secondary | ICD-10-CM

## 2022-08-16 DIAGNOSIS — I5032 Chronic diastolic (congestive) heart failure: Secondary | ICD-10-CM | POA: Insufficient documentation

## 2022-08-16 LAB — PROTIME-INR
INR: 1.6 — ABNORMAL HIGH (ref 0.8–1.2)
Prothrombin Time: 19.2 seconds — ABNORMAL HIGH (ref 11.4–15.2)

## 2022-08-16 LAB — BASIC METABOLIC PANEL
Anion gap: 12 (ref 5–15)
BUN: 37 mg/dL — ABNORMAL HIGH (ref 8–23)
CO2: 26 mmol/L (ref 22–32)
Calcium: 8.8 mg/dL — ABNORMAL LOW (ref 8.9–10.3)
Chloride: 101 mmol/L (ref 98–111)
Creatinine, Ser: 1.72 mg/dL — ABNORMAL HIGH (ref 0.61–1.24)
GFR, Estimated: 39 mL/min — ABNORMAL LOW (ref >60)
Glucose, Bld: 92 mg/dL (ref 70–99)
Potassium: 3.9 mmol/L (ref 3.5–5.1)
Sodium: 139 mmol/L (ref 135–145)

## 2022-08-16 NOTE — Patient Instructions (Signed)
You have been referred to Dr.Cooper for TAVR evaluation (They will call you to schedule your appointment)  You have been referred to home health for physical therapy and occupational therapy  (They will contact you to schedule a home visit)  Routine lab work today. Will notify you of abnormal results  Follow up in 6 weeks  Do the following things EVERYDAY: Weigh yourself in the morning before breakfast. Write it down and keep it in a log. Take your medicines as prescribed Eat low salt foods--Limit salt (sodium) to 2000 mg per day.  Stay as active as you can everyday Limit all fluids for the day to less than 2 liters

## 2022-08-16 NOTE — Progress Notes (Signed)
ADVANCED HF CLINIC NOTE  Referring Physician: Clarisa Kindred, NP  Primary Care: [name], MD Primary Cardiologist: Lorine Bears, MD HF Cardiologist: Dr. Gala Romney  HPI:  [name] is a 82 y.o. male with severe PAD, CAD s/p CABG, CKD 3a, renal artery stenosis, COPD, intraventricular hemorrhage, aortic stenosis, PAF, DVT, subclavian artery stenosis, tobacco use and chronic heart failure.   Underwent CABG x 3 in 2013 with placement of a LIMA-LAD, SVG-RPDA, and SVG-OM. He suffered an intracranial hemorrhage in 2014 which was suspected to be due to malignant hypertension and DAPT.  Shortly thereafter, he was diagnosed with extensive right-sided DVT and underwent IVC filter in the setting of contraindication for anticoagulation.    In 2018, he had extensive bilateral occlusive DVTs, which required catheter-based therapy and he has been on anticoagulation since then with warfarin. (Unable to afford DOAC)  EF was previously as low as 35 to 40% prior to his bypass surgery with subsequent improvement to 55 to 60% by echo in 2018.   Cath 11/22 and showed 3/3 patent grafts with known occlusive left main and RCA disease. AS was moderate by gradient 15 mmHg; AVA 1.1 cm.  RHC with elevated filling pressures. Due to worsening claudication at follow-up, he underwent repeat ABIs which were abnormal at 0.61 on the right and 0.56 on the left.  Lower extremity angiogram was performed January 2023 which showed patent left renal artery stent with moderate right renal artery stenosis, patent bilateral iliac kissing stents extending into the distal aorta with mild restenosis, subtotal occlusion of the inferior branch of the right profunda with diffuse calcified SFA disease and collaterals from the profunda and three-vessel runoff below the knee.     Admitted 10/23 after a fall found to have right rib fracture, pneumothorax and subcutaneous emphysema  CT showed lung contusion. Recovered with supportive care.  Hospitalization c/b AF with RVR, rate controlled with Coreg.     Admitted 03/23/22 due to acute on chronic diastolic HF w/ significant volume overload.    Echo 03/24/22 EF 50-55% along with mild/moderate [name], moderate AS with severe calcification of aortic valve.   In 12/23, seen in HF Clinic for massive fluid overload.  Started on Furoscix x 4 days with 2 days of metolazone as well. He diuresed 15-20 pounds. BP low but no orthostasis. Edema resolved. We scheduled R/L cath (pressures only) to assess hemodynamics and AS but he cancelled.   AF felt to be driving HF. Underwent TEE-guided DCCV to NSR in 1/24.  The TEE showed EF 40-45%, normal RV, ?low gradient moderate-severe aortic stenosis (visually severe but with mean gradient 13 mmHg). Had ERAF in 10 days. Repeat DC-CV on 06/14/22.   Last seen 06/20/22 was in NSR.  Has seen Dr. Kirke Corin for worsening claudication. Recommended exercise given limited revasc options  He is here for f/u with his wife. Feeling better but not back to baseline. Now has serratia UTI. Started on Keflex and now switched to Bactrim based on sensitivities. No fevers or chills. Symptoms improved. More functional around the house. Has had several mechanical falls due to balance. No CP. LE edema improved. INR has been very labilve  ECG (personally reviewed): Sinus brady 58 RBBB   Labs (2/24): K 3.7, creatinine 1.63 Labs (08/04/22): K 4.5 SCr 2.03 (after Jardiance started)   Past Medical History:  Diagnosis Date   (HFimpEF) heart failure with improved ejection fraction (HCC)    a. 06/2011 Echo: EF 35-40%; b. 06/2012 Echo: EF 50%; c. 12/2016 Echo:  EF 55-60%, no rwma, GRI DD, mild AS/[name], nl RV fxn, nl RVSP; d. 02/2021 Echo: EF 55%, GrII DD. Mild [name]. Mild to mod TR. Sev AS by VTI.   AAA (abdominal aortic aneurysm) (HCC)    a. 05/2020 Abd u/s: 3.6cm.   Aortic calcification (HCC) 05/01/2013   Bilateral renal artery stenosis (HCC)    a. 06/2013 Angio/PTA: LRA: 95 (5x18 Herculink  stent), RRA 60ost; b. 04/2021 Angio: mod RRA stenosis w/ patent LRA stent.   Carotid arterial disease (HCC)    a. 05/2020 Carotid U/S: RICA 1-39%, LICA 40-59%   CHF (congestive heart failure) (HCC)    Chronic kidney disease    COPD (chronic obstructive pulmonary disease) (HCC)    Coronary artery disease    a. 2013 s/p CABG x 3 (LIMA->LAD, VG->OM, VG->RPDA; b. 06/2012 MV: no ischemia; c. 02/2021 Cath: LM 100, RCA 90ost, 120m, free LIMA->LAD nl, VG->OM2 nl, VG->RPDA nl. Mod AS (mean grad , AVA 1.1cm^2). RHC w/ elev filling pressures.   History of prior cigarette smoking 04/11/2008   Qualifier: Diagnosis of  By: Ermalene Searing MD, Amy     Hyperlipidemia    Hypertension    Hypothyroidism    Intraventricular hemorrhage (HCC) 07/12/2012   Ischemic cardiomyopathy    a. 06/2011 Echo: EF 35-40%; b. 06/2012 Echo: EF 50%; c. 12/2016 Echo: EF 55-60%, no rwma, GRI DD; d. 02/2021 Echo: Ef 55%, GrII DD.   Moderate aortic stenosis    a. 02/2021 Echo: EF 55%, GrII DD. Sev Ca2+ of AoV. Sev AS w/ AVA by VTI 0.79cm^2; b. 02/2021 Cath: Mod AS w/ mean grad . AVA 1.1cm^2.   PAF (paroxysmal atrial fibrillation) (HCC)    Peripheral arterial disease (HCC)    a. Previous left lower extremity stenting by Dr. Evette Cristal;  b. 12/2012 s/p bilat ostial common iliac stenting; c. 04/2021 Angio: Patent LRA stent, mod RRA stenosis. Small AAA. Sev plaque RSFA 2/ subtl occl of inf branch of profunda. Diff Ca2+ SFA dzs w/ collats from profunda and 3V runoff. Diffuse LSFA dzs w/ collats from profunda. Subtl PT dzs. 3V runoff-->Med Rx.   Right leg DVT (HCC) 08/13/2012   Subclavian artery stenosis, left (HCC) 05/2012   Status post stenting of the ostium and self-expanding stent placement to the left axillary artery   Venous insufficiency     Current Outpatient Medications  Medication Sig Dispense Refill   albuterol (VENTOLIN HFA) 108 (90 Base) MCG/ACT inhaler Inhale 2 puffs into the lungs every 6 (six) hours as needed for wheezing or  shortness of breath. 18 g 0   amiodarone (PACERONE) 200 MG tablet Take 1 tablet (200 mg total) by mouth 2 (two) times daily. 60 tablet 3   atorvastatin (LIPITOR) 20 MG tablet TAKE 1 TABLET BY MOUTH EVERY DAY 30 tablet 5   augmented betamethasone dipropionate (DIPROLENE-AF) 0.05 % cream APPLY TO AFFECTED AREA TWICE A DAY     cilostazol (PLETAL) 50 MG tablet TAKE 1 TABLET BY MOUTH TWICE A DAY 180 tablet 0   levothyroxine (SYNTHROID) 150 MCG tablet TAKE ONE TABLET BY MOUTH EVERY DAY BEFORE BREAKFAST 90 tablet 0   metoprolol succinate (TOPROL XL) 25 MG 24 hr tablet Take 1 tablet (25 mg total) by mouth 2 (two) times daily. 60 tablet 3   tamsulosin (FLOMAX) 0.4 MG CAPS capsule Take 1 capsule (0.4 mg total) by mouth daily after supper. 30 capsule 6   warfarin (COUMADIN) 5 MG tablet Take 2.5 mg by mouth daily. 1/2 tab daily except 1  tab on mon,wed,frid     furosemide (LASIX) 40 MG tablet Take 1 tablet (40 mg total) by mouth 2 (two) times daily. 60 tablet 3   No current facility-administered medications for this visit.    Allergies  Allergen Reactions   Plavix [Clopidogrel Bisulfate]     Brain hemorrhage prev while on plavix and aspirin      Social History   Socioeconomic History   Marital status: Married    Spouse name: Not on file   Number of children: 2   Years of education: Not on file   Highest education level: Not on file  Occupational History   Occupation: Retired    Associate Professor: RETIRED    Comment: Comptroller  Tobacco Use   Smoking status: Former    Packs/day: 1.00    Years: 60.00    Total pack years: 60.00    Types: Cigarettes    Quit date: 12/25/2010    Years since quitting: 11.4   Smokeless tobacco: Never  Vaping Use   Vaping Use: Never used  Substance and Sexual Activity   Alcohol use: No   Drug use: No   Sexual activity: Not Currently  Other Topics Concern   Not on file  Social History Narrative   Lives with wife, [name]   Exercising 3 times a week.     Moderate diet control.   2 dogs   Social Determinants of Health   Financial Resource Strain: Low Risk  (07/05/2021)   Overall Financial Resource Strain (CARDIA)    Difficulty of Paying Living Expenses: Not very hard  Food Insecurity: No Food Insecurity (03/24/2022)   Hunger Vital Sign    Worried About Running Out of Food in the Last Year: Never true    Ran Out of Food in the Last Year: Never true  Transportation Needs: No Transportation Needs (03/24/2022)   PRAPARE - Administrator, Civil Service (Medical): No    Lack of Transportation (Non-Medical): No  Physical Activity: Insufficiently Active (07/05/2021)   Exercise Vital Sign    Days of Exercise per Week: 7 days    Minutes of Exercise per Session: 20 min  Stress: No Stress Concern Present (07/05/2021)   [employer] of Occupational Health - Occupational Stress Questionnaire    Feeling of Stress : Not at all  Social Connections: Moderately Isolated (07/05/2021)   Social Connection and Isolation Panel [NHANES]    Frequency of Communication with Friends and Family: More than three times a week    Frequency of Social Gatherings with Friends and Family: More than three times a week    Attends Religious Services: Never    Database administrator or Organizations: No    Attends Banker Meetings: Never    Marital Status: Married  Catering manager Violence: Not At Risk (03/24/2022)   Humiliation, Afraid, Rape, and Kick questionnaire    Fear of Current or Ex-Partner: No    Emotionally Abused: No    Physically Abused: No    Sexually Abused: No      Family History  Problem Relation Age of Onset   Alcohol abuse Father    Cirrhosis Father    Hypothyroidism Sister     Vitals:   06/07/22 1056  BP: 101/60  Pulse: (!) 110  SpO2: 94%  Weight: 172 lb (78 kg)   Wt Readings from Last 3 Encounters:  06/07/22 172 lb (78 kg)  05/23/22 164 lb (74.4 kg)  05/10/22 162 lb 8 oz (  73.7 kg)     PHYSICAL  EXAM: General:  Elderly. No resp difficulty HEENT: normal Neck: supple. no JVD. Carotids 2+ bilat; no bruits. No lymphadenopathy or thryomegaly appreciated. Cor: PMI nondisplaced. Regular rate & rhythm. I do not hear AS murmur well Lungs: clear but decreased Abdomen: soft, nontender, nondistended. No hepatosplenomegaly. No bruits or masses. Good bowel sounds. Extremities: no cyanosis, clubbing, rash, edema Neuro: alert & orientedx3, cranial nerves grossly intact. moves all 4 extremities w/o difficulty. Affect pleasant   ASSESSMENT & PLAN:    1. Chronic HF with mid range EF -  02/2022 showed LVEF 55 to 60%, no wall motion abnormalities, normal RV systolic function, mild [name] Moderate AS - Volume status and functional capacity much improved with adjustment of diuretic regimen and maintenance of NSR - On echo AS appears severe but gradients moderate  - TEE in 1/24 showed EF 40-45%, normal RV, ?low gradient moderate-severe aortic stenosis (visually severe but with mean gradient 13 mmHg).  - Continue Toprol XL 25 mg bid - Continue Lasix 40 mg bid.  - Continue Jardiance 10 daily - Not on MRA with CKD 4 and low BP - Will check labs today. If Scr continues to rise with cut lasix back to 40 daily now that he is on Jardiance  2. Persistent A-fib - s/p DCCV in 05/23/22 with ERAF in 10 days. - Repeat DC-CV 2/24 - Remains on amio 200 bid - Continue amiodarone 200 bid for one more month than can drop to 200 daily   - Has been on warfarin as unable to afford DOAC. INR labile and high-fall risk. Will d/w PharmD regarding help to get Eliquis - Consider Watchman  3. PVCs - Recent ZIO showed 17% PVC burden - Initially started on mexilitene 200 bid in 12/23 for PVC suppression. Switched to [facility] for AF - PVCs now suppressed   4. Moderate aortic stenosis - AS moderate by echo gradients but visually severe.  - TEE in 1/24 showed moderate-severe AS (visually looked severe but mean gradient 13 mmHg, ?low  flow/low gradient severe AS).  - Will refer to structural team to see if TAVR an option. Access and renal function likely to be issues   5. CKD stage IV - on Jardiance - Recheck labs today - plan as above  6. CAD s/p CABG and multiple PCIs - Grafts patent on cath 2022 - No current angina - Followed by Dr. Kirke Corin  7. PAD - Significant PAD history with ongoing leg weakness with exertion that likely is his claudication-equivalent.  - Off cilostazol due to HF. - Followed by Dr. Kirke Corin  8. Falls - we discussed high-risk nature of this - Refer to PT/OT  9. UTI - managed by PCP - if recurs may need to consider stopping SGLT2i   Total time spent 45 minutes. Over half that time spent discussing above.    Arvilla Meres, MD  10:10 AM

## 2022-08-16 NOTE — Telephone Encounter (Signed)
Reviewing pt's chart for upcoming coumadin clinic apt noticed pt was changed from Keflex to Bactrim yesterday. No interaction with Keflex and warfarin but there is a major interaction with Bactrim and warfarin.  Contacted pt and advised of interaction. Pt reports he will start the abx today. He reports he also had cardiology apt today and INR was checked, result is 1.6. Advised pt to take warfarin today but hold warfarin tomorrow to reduce dose in preparation for Bactrim interaction and increase in INR. Advised pt if any s/s of bleeding or abnormal bruising to contact office or go to ER. Pt denies any s/s currently.  Advised pt we should check INR again in 3-4 days after starting abx. Best to check INR on 4/26 if someone is available at [facility] lab. Advised this nurse will f/u with him on 4/25 after talking to lab at [facility] and advise when next INR check will be. Cancelled coumadin clinic apt for 4/25. This apt would have been too soon to check interaction of abx, pt must take abx for 3-4 days before checking.  Advised pt to take normal dose of warfarin today but hold warfarin tomorrow and take normal dose of warfarin the day after tomorrow and this nurse will f/u with him the day after tomorrow with further instructions about testing.  Pt verbalized understanding.   Cardiology note reports they are going to ask their pharmacists to review if pt would be eligible for financial assistance for Eliquis, due to pt's fall risk.  Pt is aware cardiology will f/u with him concerning any information for assistance.

## 2022-08-18 ENCOUNTER — Ambulatory Visit: Payer: Medicare Other | Attending: Cardiology

## 2022-08-18 ENCOUNTER — Ambulatory Visit: Payer: Medicare Other

## 2022-08-18 DIAGNOSIS — I5032 Chronic diastolic (congestive) heart failure: Secondary | ICD-10-CM | POA: Diagnosis not present

## 2022-08-18 DIAGNOSIS — J449 Chronic obstructive pulmonary disease, unspecified: Secondary | ICD-10-CM | POA: Diagnosis not present

## 2022-08-18 DIAGNOSIS — Z87891 Personal history of nicotine dependence: Secondary | ICD-10-CM | POA: Diagnosis not present

## 2022-08-18 LAB — PULMONARY FUNCTION TEST ARMC ONLY
DL/VA % pred: 55 %
DL/VA: 2.16 ml/min/mmHg/L
DLCO unc % pred: 43 %
DLCO unc: 9.87 ml/min/mmHg
FEF 25-75 Post: 0.82 L/sec
FEF 25-75 Pre: 0.52 L/sec
FEF2575-%Change-Post: 55 %
FEF2575-%Pred-Post: 46 %
FEF2575-%Pred-Pre: 30 %
FEV1-%Change-Post: 17 %
FEV1-%Pred-Post: 57 %
FEV1-%Pred-Pre: 49 %
FEV1-Post: 1.49 L
FEV1-Pre: 1.27 L
FEV1FVC-%Change-Post: 1 %
FEV1FVC-%Pred-Pre: 74 %
FEV6-%Change-Post: 15 %
FEV6-%Pred-Post: 80 %
FEV6-%Pred-Pre: 69 %
FEV6-Post: 2.73 L
FEV6-Pre: 2.37 L
FEV6FVC-%Change-Post: 0 %
FEV6FVC-%Pred-Post: 105 %
FEV6FVC-%Pred-Pre: 106 %
FVC-%Change-Post: 16 %
FVC-%Pred-Post: 76 %
FVC-%Pred-Pre: 65 %
FVC-Post: 2.78 L
FVC-Pre: 2.39 L
Post FEV1/FVC ratio: 53 %
Post FEV6/FVC ratio: 98 %
Pre FEV1/FVC ratio: 53 %
Pre FEV6/FVC Ratio: 99 %

## 2022-08-18 MED ORDER — ALBUTEROL SULFATE (2.5 MG/3ML) 0.083% IN NEBU
2.5000 mg | INHALATION_SOLUTION | Freq: Once | RESPIRATORY_TRACT | Status: AC
  Administered 2022-08-18: 2.5 mg via RESPIRATORY_TRACT
  Filled 2022-08-18: qty 3

## 2022-08-18 NOTE — Telephone Encounter (Signed)
Pt has been scheduled for lab POCT INR for tomorrow. Pt agreed to apt. Advised coumadin clinic nurse will f/u with dosing instructions.

## 2022-08-19 ENCOUNTER — Other Ambulatory Visit: Payer: Medicare Other

## 2022-08-19 NOTE — Telephone Encounter (Signed)
Pt's wife, [name], reports pt had a fall this morning but pt is denying any injuries. Denies hitting his head and denies any bleeding.  He went to bed and said he was too tired to go to lab apt today for INR check. He is currently taking Bactrim which has a major interaction with warfarin. He is requesting to [facility] lab POCT INR to Monday. Apt has been RS. Sol Blazing to hold warfarin today and Sunday and take 1/2 tablet on Saturday. Hold dose on Monday until INR result is back. Advised pt should go to ER to be assessed for the fall but pt refuses. Sol Blazing if any s/s of abnormal bruising or bleeding to contact office of go to ER. Advised if she thought pt needed to go to ER and refused and she thinks it would help if this nurse talked to pt to feel free to call the coumadin clinic. Advised if any changes or pt has another fall to call EMS. [name] verbalized understanding.

## 2022-08-22 ENCOUNTER — Other Ambulatory Visit: Payer: Medicare Other

## 2022-08-22 ENCOUNTER — Ambulatory Visit (INDEPENDENT_AMBULATORY_CARE_PROVIDER_SITE_OTHER): Payer: Medicare Other

## 2022-08-22 DIAGNOSIS — Z7901 Long term (current) use of anticoagulants: Secondary | ICD-10-CM

## 2022-08-22 LAB — POCT INR: INR: 2.4 (ref 2.0–3.0)

## 2022-08-22 NOTE — Patient Instructions (Addendum)
Pre visit review using our clinic review tool, if applicable. No additional management support is needed unless otherwise documented below in the visit note.  Take 1/2 tablet today, hold dose tomorrow, take 1/2 tablet on Wednesday, hold dose on Thursday and then resume 1/2 tablet daily except take nothing on Saturdays. Recheck in one week in coumadin clinic on 5/9.

## 2022-08-22 NOTE — Telephone Encounter (Signed)
Pt in lab today for POCT INR with result of 2.4  Pt has been taking Bactrim for 6 days and has 3 more days to finish and Bactrim can interact with warfarin for several days after stopping abx. Pt has had warfarin dose reduced to cover for interaction of Bactrim. Has held warfarin dose during taking Bactrim on 4/24, 4/26, and 4/28, which is approximately 1/2 warfarin dose for that time period. Pt is in range today but still has 3 days to finish Bactrim. Will have pt hold dose tomorrow (4/30) and 5/2 and recheck next Thursday, 5/9, in coumadin clinic.  Take 1/2 tablet today, hold dose tomorrow, take 1/2 tablet on Wednesday, hold dose on Thursday and then resume 1/2 tablet daily except take nothing on Saturdays. Recheck in one week in coumadin clinic on 5/9. Contacted pt and pt's wife by phone and advised of dosing and recheck. They both readback dosing instructions. Scheduled pt for coumadin clinic on 5/9. Advised ot watch for s/s of bleeding or abnormal bruising and if any s/s to contact office or go to ER. Both verbalized understanding.

## 2022-08-22 NOTE — Progress Notes (Addendum)
Pt has been taking Bactrim for 6 days and has 3 more days to finish and Bactrim can interact with warfarin for several days after stopping abx. Pt has had warfarin dose reduced to cover for interaction of Bactrim. Pt has held warfarin dose during taking Bactrim on 4/24, 4/26, and 4/28, which is approximately 1/2 warfarin dose for that time period. Pt is in range today but still has 3 days to finish Bactrim. Will have pt hold dose tomorrow (4/30) and 5/2 and recheck next Thursday, 5/9, in coumadin clinic.  Advised pt on phone of dosing. Take 1/2 tablet today, hold dose tomorrow, take 1/2 tablet on Wednesday, hold dose on Thursday and then resume 1/2 tablet daily except take nothing on Saturdays. Recheck in one week in coumadin clinic on 5/9. Contacted pt and pt's wife by phone and advised of dosing and recheck. They both readback dosing instructions. Scheduled pt for coumadin clinic on 5/9. Advised ot watch for s/s of bleeding or abnormal bruising and if any s/s to contact office or go to ER. Both verbalized understanding.

## 2022-08-29 ENCOUNTER — Other Ambulatory Visit: Payer: Self-pay | Admitting: Internal Medicine

## 2022-08-31 ENCOUNTER — Other Ambulatory Visit: Payer: Self-pay

## 2022-08-31 ENCOUNTER — Telehealth: Payer: Self-pay

## 2022-08-31 NOTE — Telephone Encounter (Signed)
Patient's wife [name] called this morning due to not hearing from PT/OT. A Referral form was faxed to [facility] this morning. Updated patient on status and informed patient that [facility] would contact him.

## 2022-09-01 ENCOUNTER — Ambulatory Visit (INDEPENDENT_AMBULATORY_CARE_PROVIDER_SITE_OTHER): Payer: Medicare Other

## 2022-09-01 DIAGNOSIS — Z7901 Long term (current) use of anticoagulants: Secondary | ICD-10-CM

## 2022-09-01 DIAGNOSIS — I480 Paroxysmal atrial fibrillation: Secondary | ICD-10-CM | POA: Diagnosis not present

## 2022-09-01 DIAGNOSIS — I5032 Chronic diastolic (congestive) heart failure: Secondary | ICD-10-CM | POA: Diagnosis not present

## 2022-09-01 DIAGNOSIS — Z7984 Long term (current) use of oral hypoglycemic drugs: Secondary | ICD-10-CM | POA: Diagnosis not present

## 2022-09-01 DIAGNOSIS — Z951 Presence of aortocoronary bypass graft: Secondary | ICD-10-CM | POA: Diagnosis not present

## 2022-09-01 DIAGNOSIS — I739 Peripheral vascular disease, unspecified: Secondary | ICD-10-CM | POA: Diagnosis not present

## 2022-09-01 DIAGNOSIS — N184 Chronic kidney disease, stage 4 (severe): Secondary | ICD-10-CM | POA: Diagnosis not present

## 2022-09-01 DIAGNOSIS — I255 Ischemic cardiomyopathy: Secondary | ICD-10-CM | POA: Diagnosis not present

## 2022-09-01 DIAGNOSIS — J449 Chronic obstructive pulmonary disease, unspecified: Secondary | ICD-10-CM | POA: Diagnosis not present

## 2022-09-01 DIAGNOSIS — I130 Hypertensive heart and chronic kidney disease with heart failure and stage 1 through stage 4 chronic kidney disease, or unspecified chronic kidney disease: Secondary | ICD-10-CM | POA: Diagnosis not present

## 2022-09-01 DIAGNOSIS — Z86718 Personal history of other venous thrombosis and embolism: Secondary | ICD-10-CM | POA: Diagnosis not present

## 2022-09-01 DIAGNOSIS — E039 Hypothyroidism, unspecified: Secondary | ICD-10-CM | POA: Diagnosis not present

## 2022-09-01 DIAGNOSIS — I2510 Atherosclerotic heart disease of native coronary artery without angina pectoris: Secondary | ICD-10-CM | POA: Diagnosis not present

## 2022-09-01 DIAGNOSIS — I714 Abdominal aortic aneurysm, without rupture, unspecified: Secondary | ICD-10-CM | POA: Diagnosis not present

## 2022-09-01 DIAGNOSIS — I080 Rheumatic disorders of both mitral and aortic valves: Secondary | ICD-10-CM | POA: Diagnosis not present

## 2022-09-01 DIAGNOSIS — Z87891 Personal history of nicotine dependence: Secondary | ICD-10-CM | POA: Diagnosis not present

## 2022-09-01 DIAGNOSIS — Z955 Presence of coronary angioplasty implant and graft: Secondary | ICD-10-CM | POA: Diagnosis not present

## 2022-09-01 DIAGNOSIS — E785 Hyperlipidemia, unspecified: Secondary | ICD-10-CM | POA: Diagnosis not present

## 2022-09-01 DIAGNOSIS — I493 Ventricular premature depolarization: Secondary | ICD-10-CM | POA: Diagnosis not present

## 2022-09-01 DIAGNOSIS — B9689 Other specified bacterial agents as the cause of diseases classified elsewhere: Secondary | ICD-10-CM | POA: Diagnosis not present

## 2022-09-01 DIAGNOSIS — I701 Atherosclerosis of renal artery: Secondary | ICD-10-CM | POA: Diagnosis not present

## 2022-09-01 DIAGNOSIS — I872 Venous insufficiency (chronic) (peripheral): Secondary | ICD-10-CM | POA: Diagnosis not present

## 2022-09-01 DIAGNOSIS — Z8673 Personal history of transient ischemic attack (TIA), and cerebral infarction without residual deficits: Secondary | ICD-10-CM | POA: Diagnosis not present

## 2022-09-01 DIAGNOSIS — Z9181 History of falling: Secondary | ICD-10-CM | POA: Diagnosis not present

## 2022-09-01 DIAGNOSIS — N390 Urinary tract infection, site not specified: Secondary | ICD-10-CM | POA: Diagnosis not present

## 2022-09-01 LAB — POCT INR: INR: 1.8 — AB (ref 2.0–3.0)

## 2022-09-01 NOTE — Patient Instructions (Addendum)
Pre visit review using our clinic review tool, if applicable. No additional management support is needed unless otherwise documented below in the visit note.  Increase dose today to take 1/2 tablet daily except take nothing on Saturdays. Recheck in 2 weeks.

## 2022-09-01 NOTE — Progress Notes (Signed)
Increase dose today to take 1/2 tablet daily except take nothing on Saturdays. Recheck in 2 weeks.

## 2022-09-01 NOTE — Progress Notes (Signed)
Pt reports foot injury 3 days due to one toe nail on one foot hitting the other foot. No bleeding, warmth, redness, or discharge. Pt does report some swelling in that foot. Advised pt to contact the podiatrist he sees and if unable to get an apt today or tomorrow contact PCP office. Pt verbalized understanding.

## 2022-09-02 ENCOUNTER — Encounter: Payer: Self-pay | Admitting: Cardiovascular Disease

## 2022-09-02 ENCOUNTER — Ambulatory Visit: Payer: Medicare Other | Attending: Cardiovascular Disease | Admitting: Cardiovascular Disease

## 2022-09-02 VITALS — BP 108/50 | HR 53 | Ht 68.5 in | Wt 152.8 lb

## 2022-09-02 DIAGNOSIS — I350 Nonrheumatic aortic (valve) stenosis: Secondary | ICD-10-CM

## 2022-09-02 NOTE — Progress Notes (Signed)
Cardiology Office Note:    Date:  09/02/2022   ID:  [name], DOB 1941/02/25, MRN [id number]  PCP:  [name], MD   [facility] Providers Cardiologist:  Lorine Bears, MD Electrophysiologist:  Lanier Prude, MD  Advanced Heart Failure:  Arvilla Meres, MD     Referring MD: [name], MD   Chief Complaint  Patient presents with   Aortic Stenosis    History of Present Illness:    [name] is a 82 y.o. male referred for evaluation of severe aortic stenosis.  The patient has an extensive cardiac history.  He recently has been followed closely by Dr. Gala Romney in the advanced heart failure clinic.  He has been noted to have aortic stenosis over imaging studies performed the past few years.  By direct catheter measurement during cardiac catheterization in November 2022 he was found to have a mean transaortic valve gradient of 15 mmHg with calculated aortic valve area of 1.1 cm.  Echo assessment has demonstrated severe calcification and leaflet immobility of the aortic valve leaflets, but his echo gradients have also been lower than expected with a mean gradient on his most recent echo of 13 mmHg.  He presents today with his wife for further discussion.  The patient has multivessel coronary artery disease and underwent CABG in 2013, demonstrated to have patent grafts at the time of his most recent heart catheterization in 2022.  He has extensive peripheral arterial disease with history of bilateral iliac stents and infrainguinal disease as well.  He has marked limitation from his leg weakness and gait instability.  He can walk a little bit in the house, but is generally in a wheelchair when he is out.  He was much more functional year ago, but has significantly declined over the past year.  The patient complains of fatigue, shortness of breath with activity, and leg weakness as outlined above.  He denies orthopnea, PND, or chest pain.  He has had no  lightheadedness or syncope.  He has had several mechanical falls, with a bad fall last October that he feels like he is never really recovered from.  Past Medical History:  Diagnosis Date   (HFimpEF) heart failure with improved ejection fraction (HCC)    a. 06/2011 Echo: EF 35-40%; b. 06/2012 Echo: EF 50%; c. 12/2016 Echo: EF 55-60%, no rwma, GRI DD, mild AS/MR, nl RV fxn, nl RVSP; d. 02/2021 Echo: EF 55%, GrII DD. Mild MR. Mild to mod TR. Sev AS by VTI.   AAA (abdominal aortic aneurysm) (HCC)    a. 05/2020 Abd u/s: 3.6cm.   Aortic calcification (HCC) 05/01/2013   Bilateral renal artery stenosis (HCC)    a. 06/2013 Angio/PTA: LRA: 95 (5x18 Herculink stent), RRA 60ost; b. 04/2021 Angio: mod RRA stenosis w/ patent LRA stent.   Carotid arterial disease (HCC)    a. 05/2020 Carotid U/S: RICA 1-39%, LICA 40-59%   CHF (congestive heart failure) (HCC)    Chronic kidney disease    COPD (chronic obstructive pulmonary disease) (HCC)    Coronary artery disease    a. 2013 s/p CABG x 3 (LIMA->LAD, VG->OM, VG->RPDA; b. 06/2012 MV: no ischemia; c. 02/2021 Cath: LM 100, RCA 90ost, 154m, free LIMA->LAD nl, VG->OM2 nl, VG->RPDA nl. Mod AS (mean grad , AVA 1.1cm^2). RHC w/ elev filling pressures.   History of prior cigarette smoking 04/11/2008   Qualifier: Diagnosis of  By: Ermalene Searing MD, Amy     Hyperlipidemia    Hypertension  Hypothyroidism    Intraventricular hemorrhage (HCC) 07/12/2012   Ischemic cardiomyopathy    a. 06/2011 Echo: EF 35-40%; b. 06/2012 Echo: EF 50%; c. 12/2016 Echo: EF 55-60%, no rwma, GRI DD; d. 02/2021 Echo: Ef 55%, GrII DD.   Moderate aortic stenosis    a. 02/2021 Echo: EF 55%, GrII DD. Sev Ca2+ of AoV. Sev AS w/ AVA by VTI 0.79cm^2; b. 02/2021 Cath: Mod AS w/ mean grad . AVA 1.1cm^2.   PAF (paroxysmal atrial fibrillation) (HCC)    Peripheral arterial disease (HCC)    a. Previous left lower extremity stenting by Dr. Evette Cristal;  b. 12/2012 s/p bilat ostial common iliac stenting; c.  04/2021 Angio: Patent LRA stent, mod RRA stenosis. Small AAA. Sev plaque RSFA 2/ subtl occl of inf branch of profunda. Diff Ca2+ SFA dzs w/ collats from profunda and 3V runoff. Diffuse LSFA dzs w/ collats from profunda. Subtl PT dzs. 3V runoff-->Med Rx.   Right leg DVT (HCC) 08/13/2012   Subclavian artery stenosis, left (HCC) 05/2012   Status post stenting of the ostium and self-expanding stent placement to the left axillary artery   Venous insufficiency     Past Surgical History:  Procedure Laterality Date   ABDOMINAL ANGIOGRAM  07/10/2013   WITH BI-FEMORAL RUNOFF       DR Kirke Corin   ABDOMINAL AORTAGRAM N/A 12/26/2012   Procedure: ABDOMINAL Ronny Flurry;  Surgeon: [name], MD;  Location: [facility];  Service: Cardiovascular;  Laterality: N/A;   ABDOMINAL AORTAGRAM N/A 07/10/2013   Procedure: ABDOMINAL Ronny Flurry;  Surgeon: [name], MD;  Location: [facility];  Service: Cardiovascular;  Laterality: N/A;   ABDOMINAL AORTOGRAM W/LOWER EXTREMITY N/A 05/12/2021   Procedure: ABDOMINAL AORTOGRAM W/LOWER EXTREMITY;  Surgeon: [name], MD;  Location: [facility];  Service: Cardiovascular;  Laterality: N/A;   ANGIOPLASTY / STENTING FEMORAL  05/2012   ANGIOPLASTY / STENTING ILIAC Bilateral 12/26/2012   ARCH AORTOGRAM N/A 06/20/2012   Procedure: ARCH AORTOGRAM;  Surgeon: [name], MD;  Location: [facility];  Service: Cardiovascular;  Laterality: N/A;   CARDIAC CATHETERIZATION  05/2012   [facility]   CARDIOVERSION N/A 05/23/2022   Procedure: CARDIOVERSION;  Surgeon: Dolores Patty, MD;  Location: [facility] ORS;  Service: Cardiovascular;  Laterality: N/A;   CARDIOVERSION N/A 06/14/2022   Procedure: CARDIOVERSION;  Surgeon: Dolores Patty, MD;  Location: [facility] ORS;  Service: Cardiovascular;  Laterality: N/A;   CHOLECYSTECTOMY  1990's   CORONARY ARTERY BYPASS GRAFT  07/11/2011   Procedure: CORONARY ARTERY BYPASS GRAFTING (CABG);  Surgeon: [name], MD;  Location:  [facility];  Service: Open Heart Surgery;  Laterality: N/A;  Times 3. On Pump. Using right greater saphenous vein and left internal mammary artery.    CORONARY ARTERY BYPASS GRAFT     HERNIA REPAIR     INSERTION OF ILIAC STENT Bilateral 12/26/2012   Procedure: INSERTION OF ILIAC STENT;  Surgeon: [name], MD;  Location: [facility];  Service: Cardiovascular;  Laterality: Bilateral;  Bilateral Common Iliac Artery   LEFT AND RIGHT HEART CATHETERIZATION WITH CORONARY ANGIOGRAM  07/07/2011   Procedure: LEFT AND RIGHT HEART CATHETERIZATION WITH CORONARY ANGIOGRAM;  Surgeon: [name], MD;  Location: [facility];  Service: Cardiovascular;;   LOWER EXTREMITY VENOGRAPHY Bilateral 12/30/2016   Procedure: Lower Extremity Venography;  Surgeon: Renford Dills, MD;  Location: [facility] INVASIVE CV LAB;  Service: Cardiovascular;  Laterality: Bilateral;   PERCUTANEOUS STENT INTERVENTION  06/20/2012  Procedure: PERCUTANEOUS STENT INTERVENTION;  Surgeon: [name], MD;  Location: [facility];  Service: Cardiovascular;;   RENAL ARTERY ANGIOPLASTY Left 07/10/2013   DR ARIDA   RIGHT/LEFT HEART CATH AND CORONARY/GRAFT ANGIOGRAPHY N/A 03/15/2021   Procedure: RIGHT/LEFT HEART CATH AND CORONARY/GRAFT ANGIOGRAPHY;  Surgeon: [name], MD;  Location: [facility] CV [facility];  Service: Cardiovascular;  Laterality: N/A;   SUBCLAVIAN ARTERY STENT  05/2012   "2 stents" (12/26/2012)   TEE WITHOUT CARDIOVERSION N/A 05/23/2022   Procedure: TRANSESOPHAGEAL ECHOCARDIOGRAM (TEE);  Surgeon: Dolores Patty, MD;  Location: [facility] ORS;  Service: Cardiovascular;  Laterality: N/A;   TOOTH EXTRACTION  Spring 2017   mulitple (6)   VENA CAVA FILTER PLACEMENT  07/2012   Removable    Current Medications: Current Meds  Medication Sig   albuterol (VENTOLIN HFA) 108 (90 Base) MCG/ACT inhaler Inhale 2 puffs into the lungs every 6 (six) hours as needed for wheezing or shortness of breath.   amiodarone (PACERONE) 200  MG tablet TAKE 1 TABLET BY MOUTH TWICE A DAY   atorvastatin (LIPITOR) 20 MG tablet TAKE 1 TABLET BY MOUTH EVERY DAY   augmented betamethasone dipropionate (DIPROLENE-AF) 0.05 % cream APPLY TO AFFECTED AREA TWICE A DAY   empagliflozin (JARDIANCE) 10 MG TABS tablet Take 1 tablet (10 mg total) by mouth daily before breakfast.   furosemide (LASIX) 40 MG tablet Take 1 tablet (40 mg total) by mouth 2 (two) times daily.   levothyroxine (SYNTHROID) 150 MCG tablet TAKE ONE TABLET BY MOUTH EVERY DAY BEFORE BREAKFAST   metoprolol succinate (TOPROL XL) 25 MG 24 hr tablet Take 1 tablet (25 mg total) by mouth 2 (two) times daily.   warfarin (COUMADIN) 5 MG tablet Take 2.5 mg by mouth daily. 1/2 tab daily except 1 tab on mon,wed,frid     Allergies:   Plavix [clopidogrel bisulfate]   Social History   Socioeconomic History   Marital status: Married    Spouse name: Not on file   Number of children: 2   Years of education: Not on file   Highest education level: Not on file  Occupational History   Occupation: Retired    Associate Professor: RETIRED    Comment: Comptroller  Tobacco Use   Smoking status: Former    Packs/day: 1.00    Years: 60.00    Additional pack years: 0.00    Total pack years: 60.00    Types: Cigarettes    Quit date: 12/25/2010    Years since quitting: 11.6   Smokeless tobacco: Never  Vaping Use   Vaping Use: Never used  Substance and Sexual Activity   Alcohol use: No   Drug use: No   Sexual activity: Not Currently  Other Topics Concern   Not on file  Social History Narrative   Lives with wife, [name]   Exercising 3 times a week.    Moderate diet control.   2 dogs   Social Determinants of Health   Financial Resource Strain: Low Risk  (07/13/2022)   Overall Financial Resource Strain (CARDIA)    Difficulty of Paying Living Expenses: Not hard at all  Food Insecurity: No Food Insecurity (07/13/2022)   Hunger Vital Sign    Worried About Running Out of Food in the Last Year:  Never true    Ran Out of Food in the Last Year: Never true  Transportation Needs: No Transportation Needs (07/13/2022)   PRAPARE - Administrator, Civil Service (Medical): No  Lack of Transportation (Non-Medical): No  Physical Activity: Insufficiently Active (07/13/2022)   Exercise Vital Sign    Days of Exercise per Week: 7 days    Minutes of Exercise per Session: 20 min  Stress: No Stress Concern Present (07/13/2022)   [employer] of Occupational Health - Occupational Stress Questionnaire    Feeling of Stress : Not at all  Social Connections: Moderately Integrated (07/13/2022)   Social Connection and Isolation Panel [NHANES]    Frequency of Communication with Friends and Family: More than three times a week    Frequency of Social Gatherings with Friends and Family: More than three times a week    Attends Religious Services: Never    Database administrator or Organizations: Yes    Attends Engineer, structural: More than 4 times per year    Marital Status: Married     Family History: The patient's family history includes Alcohol abuse in his father; Cirrhosis in his father; Hypothyroidism in his sister.  ROS:   Please see the history of present illness.    Positive for claudication symptoms in his legs, leg weakness, gait instability, and weight loss with diminished appetite.  All other systems reviewed and are negative.  EKGs/Labs/Other Studies Reviewed:    The following studies were reviewed today: Cardiac Studies & Procedures   CARDIAC CATHETERIZATION  CARDIAC CATHETERIZATION 03/15/2021  Narrative   Ost LM to Dist LM lesion is 100% stenosed.   Prox RCA to Mid RCA lesion is 100% stenosed.   Ost RCA to Prox RCA lesion is 90% stenosed.   SVG graft was visualized by angiography and is normal in caliber.   SVG graft was visualized by angiography and is normal in caliber.   LIMA graft was visualized by angiography and is normal in caliber.   The graft  exhibits no disease.   The graft exhibits no disease.  1.  Occluded native vessels including left main and mid right coronary artery.  Patent and normal grafts including SVG to right PDA, SVG to OM 2 and free LIMA to LAD which is anastomosed to the proximal portion of SVG to OM. 2.  Moderate aortic stenosis with mean gradient of 15 mmHg and valve area of 1.1 cm. 3.  Right heart catheterization showed moderately elevated wedge pressure, mild pulmonary hypertension and mildly reduced cardiac output.  Recommendations: Continue medical therapy for coronary artery disease. Aortic stenosis does not require intervention at this point. There is evidence of diastolic heart failure with elevated filling pressures.  We will resume furosemide at 20 mg every other day. The patient reports a lot of calf claudication bilaterally and will evaluate with outpatient Dopplers.  Findings Coronary Findings Diagnostic  Dominance: Right  Left Main Ost LM to Dist LM lesion is 100% stenosed. The lesion is severely calcified.  Right Coronary Artery Ost RCA to Prox RCA lesion is 90% stenosed. Prox RCA to Mid RCA lesion is 100% stenosed. The lesion is moderately calcified.  Saphenous Graft To RPDA SVG graft was visualized by angiography and is normal in caliber.  The graft exhibits no disease.  Saphenous Graft To 2nd Mrg SVG graft was visualized by angiography and is normal in caliber.  The graft exhibits no disease.  LIMA Y-graft Branch To Mid LAD LIMA graft was visualized by angiography and is normal in caliber.  free LIMA is anastomosed to the hood of SVG to OM  Intervention  No interventions have been documented.     ECHOCARDIOGRAM  ECHOCARDIOGRAM COMPLETE 03/24/2022  Narrative ECHOCARDIOGRAM REPORT    Patient Name:   Ariz MAI NICOLICH Date of Exam: 03/24/2022 Medical Rec #:  [id number]      Height:       68.3 in Accession #:    [id number]     Weight:       175.0 lb Date of Birth:  [birth year]        BSA:          1.937 m Patient Age:    81 years       BP:           119/66 mmHg Patient Gender: M              HR:           85 bpm. Exam Location:  [facility]  Procedure: 2D Echo, Color Doppler and Cardiac Doppler  Indications:     I50.31 congestive heart failure-Acute Diastolic  History:         Patient has prior history of Echocardiogram examinations, most recent 02/09/2022. HFimpEF, CAD, Prior CABG, PAD and COPD, Signs/Symptoms:Edema; Risk Factors:Hypertension.  Sonographer:     Humphrey Rolls Referring Phys:  [id number] Sunnie Nielsen Diagnosing Phys: Julien Nordmann MD   Sonographer Comments: Suboptimal subcostal window. Image acquisition challenging due to COPD. IMPRESSIONS   1. Left ventricular ejection fraction, by estimation, is 50 to 55%. The left ventricle has low normal function. The left ventricle has no regional wall motion abnormalities. Left ventricular diastolic parameters are indeterminate. 2. Right ventricular systolic function is normal. The right ventricular size is normal. 3. The mitral valve is normal in structure. Mild to moderate mitral valve regurgitation. No evidence of mitral stenosis. 4. The aortic valve is normal in structure. There is severe calcifcation of the aortic valve. Aortic valve regurgitation is not visualized. Moderate aortic valve stenosis. Aortic valve area, by VTI measures 1.52 cm. Aortic valve mean gradient measures 17.0 mmHg. Aortic valve Vmax measures 2.40 m/s. 5. The inferior vena cava is dilated in size with <50% respiratory variability, suggesting right atrial pressure of 15 mmHg. 6. Frequent PVCs noted  FINDINGS Left Ventricle: Left ventricular ejection fraction, by estimation, is 50 to 55%. The left ventricle has low normal function. The left ventricle has no regional wall motion abnormalities. The left ventricular internal cavity size was normal in size. There is no left ventricular hypertrophy. Left ventricular diastolic parameters are  indeterminate.  Right Ventricle: The right ventricular size is normal. No increase in right ventricular wall thickness. Right ventricular systolic function is normal.  Left Atrium: Left atrial size was normal in size.  Right Atrium: Right atrial size was normal in size.  Pericardium: There is no evidence of pericardial effusion.  Mitral Valve: The mitral valve is normal in structure. Mild to moderate mitral valve regurgitation. No evidence of mitral valve stenosis.  Tricuspid Valve: The tricuspid valve is normal in structure. Tricuspid valve regurgitation is mild . No evidence of tricuspid stenosis.  Aortic Valve: The aortic valve is normal in structure. There is severe calcifcation of the aortic valve. Aortic valve regurgitation is not visualized. Moderate aortic stenosis is present. Aortic valve mean gradient measures 17.0 mmHg. Aortic valve peak gradient measures 22.9 mmHg. Aortic valve area, by VTI measures 1.52 cm.  Pulmonic Valve: The pulmonic valve was normal in structure. Pulmonic valve regurgitation is not visualized. No evidence of pulmonic stenosis.  Aorta: The aortic root is normal in size and structure.  Venous: The inferior vena cava is  dilated in size with less than 50% respiratory variability, suggesting right atrial pressure of 15 mmHg.  IAS/Shunts: No atrial level shunt detected by color flow Doppler.   LEFT VENTRICLE PLAX 2D LVIDd:         4.70 cm   Diastology LVIDs:         3.90 cm   LV e' medial:    5.87 cm/s LV PW:         1.40 cm   LV E/e' medial:  18.7 LV IVS:        1.00 cm   LV e' lateral:   11.20 cm/s LVOT diam:     2.20 cm   LV E/e' lateral: 9.8 LV SV:         66 LV SV Index:   34 LVOT Area:     3.80 cm   RIGHT VENTRICLE RV Basal diam:  3.50 cm  LEFT ATRIUM             Index        RIGHT ATRIUM           Index LA diam:        3.90 cm 2.01 cm/m   RA Area:     13.20 cm LA Vol (A2C):   31.7 ml 16.37 ml/m  RA Volume:   32.20 ml  16.63 ml/m LA  Vol (A4C):   37.2 ml 19.21 ml/m LA Biplane Vol: 34.8 ml 17.97 ml/m AORTIC VALVE                     PULMONIC VALVE AV Area (Vmax):    1.48 cm      PV Vmax:       0.90 m/s AV Area (Vmean):   1.38 cm      PV Vmean:      63.700 cm/s AV Area (VTI):     1.52 cm      PV VTI:        0.169 m AV Vmax:           239.50 cm/s   PV Peak grad:  3.2 mmHg AV Vmean:          176.000 cm/s  PV Mean grad:  2.0 mmHg AV VTI:            0.436 m AV Peak Grad:      22.9 mmHg AV Mean Grad:      17.0 mmHg LVOT Vmax:         93.30 cm/s LVOT Vmean:        63.700 cm/s LVOT VTI:          0.174 m LVOT/AV VTI ratio: 0.40  AORTA Ao Root diam: 3.00 cm  MITRAL VALVE MV Area (PHT): 4.74 cm     SHUNTS MV Decel Time: 160 msec     Systemic VTI:  0.17 m MV E velocity: 110.00 cm/s  Systemic Diam: 2.20 cm MV A velocity: 49.30 cm/s MV E/A ratio:  2.23  Julien Nordmann MD Electronically signed by Julien Nordmann MD Signature Date/Time: 03/24/2022/5:58:18 PM    Final   TEE  ECHO TEE 05/26/2022  Narrative TRANSESOPHOGEAL ECHO REPORT    Patient Name:   Salaam HALDON BARLET Date of Exam: 05/23/2022 Medical Rec #:  [id number]      Height:       69.0 in Accession #:    [id number]     Weight:       162.5 lb Date of Birth:  1940/07/07       BSA:          1.892 m Patient Age:    81 years       BP:           89/70 mmHg Patient Gender: M              HR:           114 bpm. Exam Location:  [facility]  Procedure: Transesophageal Echo, Color Doppler and Cardiac Doppler  Indications:     PAF( Paroxysmal Atrial Fibrillation I48.0)  History:         Patient has prior history of Echocardiogram examinations, most recent 03/24/2022. COPD; Risk Factors:Hypertension. AAA.  Sonographer:     Cristela Blue Referring Phys:  [id number] DANIEL R BENSIMHON Diagnosing Phys: Arvilla Meres MD  PROCEDURE: The transesophogeal probe was passed without difficulty through the esophogus of the patient. Sedation performed by performing physician. The  patient developed no complications during the procedure.  IMPRESSIONS   1. Left ventricular ejection fraction, by estimation, is 40 to 45%. The left ventricle has mildly decreased function. 2. Right ventricular systolic function is normal. The right ventricular size is normal. 3. Left atrial size was moderately dilated. No left atrial/left atrial appendage thrombus was detected. 4. Right atrial size was mildly dilated. 5. The mitral valve is normal in structure. Mild mitral valve regurgitation. 6. Visually AS appears severe but by gradient measures in mild to moderate range. Unable to planimeter AoV orifcie as it was very eccentric. The aortic valve is tricuspid. There is severe calcifcation of the aortic valve. Aortic valve regurgitation is trivial. Moderate to severe aortic valve stenosis. Aortic valve mean gradient measures 13.3 mmHg. Aortic valve Vmax measures 2.26 m/s. 7. There is Severe (Grade IV) plaque involving the descending aorta. 8. Successful DC-CV  FINDINGS Left Ventricle: Left ventricular ejection fraction, by estimation, is 40 to 45%. The left ventricle has mildly decreased function. The left ventricular internal cavity size was normal in size.  Right Ventricle: The right ventricular size is normal. No increase in right ventricular wall thickness. Right ventricular systolic function is normal.  Left Atrium: Left atrial size was moderately dilated. No left atrial/left atrial appendage thrombus was detected.  Right Atrium: Right atrial size was mildly dilated.  Pericardium: There is no evidence of pericardial effusion.  Mitral Valve: The mitral valve is normal in structure. Mild mitral valve regurgitation.  Tricuspid Valve: The tricuspid valve is normal in structure. Tricuspid valve regurgitation is mild.  Aortic Valve: Visually AS appears severe but by gradient measures in mild to moderate range. Unable to planimeter AoV orifcie as it was very eccentric. The aortic valve  is tricuspid. There is severe calcifcation of the aortic valve. Aortic valve regurgitation is trivial. Moderate to severe aortic stenosis is present. Aortic valve mean gradient measures 13.3 mmHg. Aortic valve peak gradient measures 20.4 mmHg.  Pulmonic Valve: The pulmonic valve was normal in structure. Pulmonic valve regurgitation is trivial.  Aorta: The aortic root and ascending aorta are structurally normal, with no evidence of dilitation. There is severe (Grade IV) plaque involving the descending aorta.  IAS/Shunts: No atrial level shunt detected by color flow Doppler.   AORTIC VALVE AV Vmax:      226.00 cm/s AV Vmean:     174.000 cm/s AV VTI:       0.394 m AV Peak Grad: 20.4 mmHg AV Mean Grad: 13.3 mmHg  Arvilla Meres MD Electronically signed by Arvilla Meres  MD Signature Date/Time: 05/23/2022/4:32:31 PM    Final (Updated)   MONITORS  LONG TERM MONITOR (3-14 DAYS) 02/16/2022  Narrative Patch Wear Time:  2 days and 22 hours (2023-09-27T20:19:53-0400 to 2023-09-30T18:51:35-0400)  Patient had a min HR of 63 bpm, max HR of 187 bpm, and avg HR of 83 bpm. Predominant underlying rhythm was Sinus Rhythm. Bundle Branch Block/IVCD was present. 22 Ventricular Tachycardia runs occurred, the run with the fastest interval lasting 4 beats with a max rate of 167 bpm, the longest lasting 6 beats with an avg rate of 104 bpm. 24 Supraventricular Tachycardia runs occurred, the run with the fastest interval lasting 14 beats with a max rate of 187 bpm, the longest lasting 20 beats with an avg rate of 111 bpm. Occasional PACs with a burden of 2.8% and frequent PVCs with a burden of 17%.            EKG:  EKG is not ordered today.    Recent Labs: 02/08/2022: Magnesium 2.4 06/20/2022: B Natriuretic Peptide 585.2 08/04/2022: ALT 13; Hemoglobin 14.2; Platelets 283.0; TSH 0.74 08/16/2022: BUN 37; Creatinine, Ser 1.72; Potassium 3.9; Sodium 139  Recent Lipid Panel    Component Value  Date/Time   CHOL 130 08/04/2022 0912   CHOL 125 01/31/2014 0756   TRIG 76.0 08/04/2022 0912   HDL 61.20 08/04/2022 0912   HDL 63 01/31/2014 0756   CHOLHDL 2 08/04/2022 0912   VLDL 15.2 08/04/2022 0912   LDLCALC 54 08/04/2022 0912   LDLCALC 46 01/31/2014 0756     Risk Assessment/Calculations:                     Physical Exam:    VS:  BP (!) 108/50   Pulse (!) 53   Ht 5' 8.5" (1.74 m)   Wt 152 lb 12.8 oz (69.3 kg)   SpO2 96%   BMI 22.90 kg/m     Wt Readings from Last 3 Encounters:  09/02/22 152 lb 12.8 oz (69.3 kg)  08/16/22 159 lb 6 oz (72.3 kg)  08/11/22 159 lb 4 oz (72.2 kg)     GEN: Pleasant elderly male in no acute distress HEENT: Normal NECK: No JVD; No carotid bruits LYMPHATICS: No lymphadenopathy CARDIAC: RRR, 2/6 mid peaking crescendo decrescendo murmur best heard at the left lower sternal border, A2 is present RESPIRATORY:  Clear to auscultation without rales, wheezing or rhonchi  ABDOMEN: Soft, non-tender, non-distended MUSCULOSKELETAL:  No edema; No deformity  SKIN: Warm and dry NEUROLOGIC:  Alert and oriented x 3 PSYCHIATRIC:  Normal affect   ASSESSMENT:    1. 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  I personally reviewed his echo and TEE studies.  By surface echo he has a mean transaortic gradient of 17 mmHg and by TEE it was measured at 13 mmHg.  He did have a low stroke-volume index of 34, but his dimensionless index was above 0.25.  There is some discrepancy in his Doppler/hemodynamic measurements and the severity of valvular calcification and leaflet restriction seen on 2D imaging.  We discussed the natural history of aortic stenosis and the context of the patient's ischemic heart disease, chronic heart failure, and other comorbid conditions.  We reviewed specific treatment options which  include palliative medical therapy versus transcatheter aortic valve replacement.  I do not think the patient would  be considered a candidate for redo heart surgery under any circumstances.  Barriers to transcatheter aortic valve replacement include the patient's degree of chronic kidney disease and peripheral arterial disease.  I reviewed his lower extremity angiogram and I doubt he would have transfemoral access for TAVR.  Left subclavian access could be limited by the presence of his LIMA graft.  We discussed consideration of performing a CTA study to evaluate his anatomy for TAVR including vascular access options.  I also discussed that his evaluation would include the CTA study, possibly cardiac catheterization, and a cardiac surgical evaluation as part of a multidisciplinary approach to his care.  Considering his degree of functional loss, weight loss, and comorbidity, I talked to him at length about a palliative approach to his care today.  He seems to favor this approach and really does not want to move forward with extensive testing or with attempts at TAVR.  He understands that his aortic stenosis will likely worsen and will contribute to progressive symptoms of heart failure.  He states that he is talk to his other doctors about goals of care and he understands that his care is likely moving in a palliative direction with focus on medical therapy and quality of life moving forward.  He will continue to follow with his other providers and I would be happy to see him back in the future if needed.      Medication Adjustments/Labs and Tests Ordered: Current medicines are reviewed at length with the patient today.  Concerns regarding medicines are outlined above.  No orders of the defined types were placed in this encounter.  No orders of the defined types were placed in this encounter.   Patient Instructions  Medication Instructions:  Your physician recommends that you continue on your current  medications as directed. Please refer to the Current Medication list given to you today.  *If you need a refill on your cardiac medications before your next appointment, please call your pharmacy*   Lab Work: NONE If you have labs (blood work) drawn today and your tests are completely normal, you will receive your results only by: MyChart Message (if you have MyChart) OR A paper copy in the mail If you have any lab test that is abnormal or we need to change your treatment, we will call you to review the results.   Testing/Procedures: NONE   Follow-Up: At [facility], you and your health needs are our priority.  As part of our continuing mission to provide you with exceptional heart care, we have created designated Provider Care Teams.  These Care Teams include your primary Cardiologist (physician) and Advanced Practice Providers (APPs -  Physician Assistants and Nurse Practitioners) who all work together to provide you with the care you need, when you need it.  We recommend signing up for the patient portal called "MyChart".  Sign up information is provided on this After Visit Summary.  MyChart is used to connect with patients for Virtual Visits (Telemedicine).  Patients are able to view lab/test results, encounter notes, upcoming appointments, etc.  Non-urgent messages can be sent to your provider as well.   To learn more about what you can do with MyChart, go to [email or web address].    Your next appointment:   As Needed  Provider:   Tonny Bollman, MD     Signed, Tonny Bollman, MD  09/02/2022 12:23 PM    [facility]

## 2022-09-02 NOTE — Patient Instructions (Signed)
Medication Instructions:  Your physician recommends that you continue on your current medications as directed. Please refer to the Current Medication list given to you today.  *If you need a refill on your cardiac medications before your next appointment, please call your pharmacy*   Lab Work: NONE If you have labs (blood work) drawn today and your tests are completely normal, you will receive your results only by: MyChart Message (if you have MyChart) OR A paper copy in the mail If you have any lab test that is abnormal or we need to change your treatment, we will call you to review the results.   Testing/Procedures: NONE   Follow-Up: At [facility], you and your health needs are our priority.  As part of our continuing mission to provide you with exceptional heart care, we have created designated Provider Care Teams.  These Care Teams include your primary Cardiologist (physician) and Advanced Practice Providers (APPs -  Physician Assistants and Nurse Practitioners) who all work together to provide you with the care you need, when you need it.  We recommend signing up for the patient portal called "MyChart".  Sign up information is provided on this After Visit Summary.  MyChart is used to connect with patients for Virtual Visits (Telemedicine).  Patients are able to view lab/test results, encounter notes, upcoming appointments, etc.  Non-urgent messages can be sent to your provider as well.   To learn more about what you can do with MyChart, go to [email or web address].    Your next appointment:   As Needed  Provider:   Michael Cooper, MD   

## 2022-09-05 ENCOUNTER — Other Ambulatory Visit: Payer: Self-pay

## 2022-09-06 DIAGNOSIS — N184 Chronic kidney disease, stage 4 (severe): Secondary | ICD-10-CM | POA: Diagnosis not present

## 2022-09-06 DIAGNOSIS — I130 Hypertensive heart and chronic kidney disease with heart failure and stage 1 through stage 4 chronic kidney disease, or unspecified chronic kidney disease: Secondary | ICD-10-CM | POA: Diagnosis not present

## 2022-09-06 DIAGNOSIS — I5032 Chronic diastolic (congestive) heart failure: Secondary | ICD-10-CM | POA: Diagnosis not present

## 2022-09-06 DIAGNOSIS — N390 Urinary tract infection, site not specified: Secondary | ICD-10-CM | POA: Diagnosis not present

## 2022-09-06 DIAGNOSIS — I2510 Atherosclerotic heart disease of native coronary artery without angina pectoris: Secondary | ICD-10-CM | POA: Diagnosis not present

## 2022-09-06 DIAGNOSIS — B9689 Other specified bacterial agents as the cause of diseases classified elsewhere: Secondary | ICD-10-CM | POA: Diagnosis not present

## 2022-09-07 DIAGNOSIS — B9689 Other specified bacterial agents as the cause of diseases classified elsewhere: Secondary | ICD-10-CM | POA: Diagnosis not present

## 2022-09-07 DIAGNOSIS — N390 Urinary tract infection, site not specified: Secondary | ICD-10-CM | POA: Diagnosis not present

## 2022-09-07 DIAGNOSIS — I2510 Atherosclerotic heart disease of native coronary artery without angina pectoris: Secondary | ICD-10-CM | POA: Diagnosis not present

## 2022-09-07 DIAGNOSIS — I130 Hypertensive heart and chronic kidney disease with heart failure and stage 1 through stage 4 chronic kidney disease, or unspecified chronic kidney disease: Secondary | ICD-10-CM | POA: Diagnosis not present

## 2022-09-07 DIAGNOSIS — I5032 Chronic diastolic (congestive) heart failure: Secondary | ICD-10-CM | POA: Diagnosis not present

## 2022-09-07 DIAGNOSIS — N184 Chronic kidney disease, stage 4 (severe): Secondary | ICD-10-CM | POA: Diagnosis not present

## 2022-09-08 ENCOUNTER — Telehealth (HOSPITAL_COMMUNITY): Payer: Self-pay | Admitting: Cardiology

## 2022-09-08 NOTE — Telephone Encounter (Signed)
Esther with [facility] called to report pt does not qualify for home OT at this time based on evaluation. PT will be out to complete separate assessment

## 2022-09-09 DIAGNOSIS — N390 Urinary tract infection, site not specified: Secondary | ICD-10-CM | POA: Diagnosis not present

## 2022-09-09 DIAGNOSIS — I2510 Atherosclerotic heart disease of native coronary artery without angina pectoris: Secondary | ICD-10-CM | POA: Diagnosis not present

## 2022-09-09 DIAGNOSIS — N184 Chronic kidney disease, stage 4 (severe): Secondary | ICD-10-CM | POA: Diagnosis not present

## 2022-09-09 DIAGNOSIS — I130 Hypertensive heart and chronic kidney disease with heart failure and stage 1 through stage 4 chronic kidney disease, or unspecified chronic kidney disease: Secondary | ICD-10-CM | POA: Diagnosis not present

## 2022-09-09 DIAGNOSIS — I5032 Chronic diastolic (congestive) heart failure: Secondary | ICD-10-CM | POA: Diagnosis not present

## 2022-09-09 DIAGNOSIS — B9689 Other specified bacterial agents as the cause of diseases classified elsewhere: Secondary | ICD-10-CM | POA: Diagnosis not present

## 2022-09-13 DIAGNOSIS — I5032 Chronic diastolic (congestive) heart failure: Secondary | ICD-10-CM | POA: Diagnosis not present

## 2022-09-13 DIAGNOSIS — I2510 Atherosclerotic heart disease of native coronary artery without angina pectoris: Secondary | ICD-10-CM | POA: Diagnosis not present

## 2022-09-13 DIAGNOSIS — N390 Urinary tract infection, site not specified: Secondary | ICD-10-CM | POA: Diagnosis not present

## 2022-09-13 DIAGNOSIS — I130 Hypertensive heart and chronic kidney disease with heart failure and stage 1 through stage 4 chronic kidney disease, or unspecified chronic kidney disease: Secondary | ICD-10-CM | POA: Diagnosis not present

## 2022-09-13 DIAGNOSIS — N184 Chronic kidney disease, stage 4 (severe): Secondary | ICD-10-CM | POA: Diagnosis not present

## 2022-09-13 DIAGNOSIS — B9689 Other specified bacterial agents as the cause of diseases classified elsewhere: Secondary | ICD-10-CM | POA: Diagnosis not present

## 2022-09-14 ENCOUNTER — Other Ambulatory Visit: Payer: Self-pay

## 2022-09-15 ENCOUNTER — Ambulatory Visit (INDEPENDENT_AMBULATORY_CARE_PROVIDER_SITE_OTHER): Payer: Medicare Other

## 2022-09-15 DIAGNOSIS — Z7901 Long term (current) use of anticoagulants: Secondary | ICD-10-CM | POA: Diagnosis not present

## 2022-09-15 DIAGNOSIS — N184 Chronic kidney disease, stage 4 (severe): Secondary | ICD-10-CM | POA: Diagnosis not present

## 2022-09-15 DIAGNOSIS — I5032 Chronic diastolic (congestive) heart failure: Secondary | ICD-10-CM | POA: Diagnosis not present

## 2022-09-15 DIAGNOSIS — I130 Hypertensive heart and chronic kidney disease with heart failure and stage 1 through stage 4 chronic kidney disease, or unspecified chronic kidney disease: Secondary | ICD-10-CM | POA: Diagnosis not present

## 2022-09-15 DIAGNOSIS — N390 Urinary tract infection, site not specified: Secondary | ICD-10-CM | POA: Diagnosis not present

## 2022-09-15 DIAGNOSIS — I2510 Atherosclerotic heart disease of native coronary artery without angina pectoris: Secondary | ICD-10-CM | POA: Diagnosis not present

## 2022-09-15 DIAGNOSIS — B9689 Other specified bacterial agents as the cause of diseases classified elsewhere: Secondary | ICD-10-CM | POA: Diagnosis not present

## 2022-09-15 LAB — POCT INR: INR: 3.2 — AB (ref 2.0–3.0)

## 2022-09-15 NOTE — Patient Instructions (Addendum)
Pre visit review using our clinic review tool, if applicable. No additional management support is needed unless otherwise documented below in the visit note.  Hold dose today and the continue 1/2 tablet daily except take nothing on Saturdays. Recheck in 2 weeks.

## 2022-09-15 NOTE — Progress Notes (Signed)
Hold dose today and the continue 1/2 tablet daily except take nothing on Saturdays. Recheck in 2 weeks.

## 2022-09-21 DIAGNOSIS — N390 Urinary tract infection, site not specified: Secondary | ICD-10-CM | POA: Diagnosis not present

## 2022-09-21 DIAGNOSIS — I5032 Chronic diastolic (congestive) heart failure: Secondary | ICD-10-CM | POA: Diagnosis not present

## 2022-09-21 DIAGNOSIS — B9689 Other specified bacterial agents as the cause of diseases classified elsewhere: Secondary | ICD-10-CM | POA: Diagnosis not present

## 2022-09-21 DIAGNOSIS — N184 Chronic kidney disease, stage 4 (severe): Secondary | ICD-10-CM | POA: Diagnosis not present

## 2022-09-21 DIAGNOSIS — I130 Hypertensive heart and chronic kidney disease with heart failure and stage 1 through stage 4 chronic kidney disease, or unspecified chronic kidney disease: Secondary | ICD-10-CM | POA: Diagnosis not present

## 2022-09-21 DIAGNOSIS — I2510 Atherosclerotic heart disease of native coronary artery without angina pectoris: Secondary | ICD-10-CM | POA: Diagnosis not present

## 2022-09-28 ENCOUNTER — Other Ambulatory Visit: Payer: Self-pay

## 2022-09-28 ENCOUNTER — Telehealth: Payer: Self-pay | Admitting: Cardiovascular Disease

## 2022-09-28 ENCOUNTER — Emergency Department
Admission: EM | Admit: 2022-09-28 | Discharge: 2022-09-28 | Disposition: A | Discharge status: Home / Self Care | Payer: Medicare Other | Attending: Emergency Medicine | Admitting: Emergency Medicine

## 2022-09-28 ENCOUNTER — Telehealth: Payer: Self-pay

## 2022-09-28 ENCOUNTER — Encounter: Payer: Medicare Other | Admitting: Internal Medicine

## 2022-09-28 DIAGNOSIS — M79675 Pain in left toe(s): Secondary | ICD-10-CM | POA: Diagnosis not present

## 2022-09-28 DIAGNOSIS — I739 Peripheral vascular disease, unspecified: Secondary | ICD-10-CM

## 2022-09-28 DIAGNOSIS — L03115 Cellulitis of right lower limb: Secondary | ICD-10-CM | POA: Diagnosis not present

## 2022-09-28 DIAGNOSIS — M79674 Pain in right toe(s): Secondary | ICD-10-CM | POA: Diagnosis not present

## 2022-09-28 DIAGNOSIS — L97511 Non-pressure chronic ulcer of other part of right foot limited to breakdown of skin: Secondary | ICD-10-CM | POA: Diagnosis not present

## 2022-09-28 DIAGNOSIS — R2241 Localized swelling, mass and lump, right lower limb: Secondary | ICD-10-CM | POA: Diagnosis present

## 2022-09-28 LAB — CBC WITH DIFFERENTIAL/PLATELET
Abs Immature Granulocytes: 0.09 10*3/uL — ABNORMAL HIGH (ref 0.00–0.07)
Basophils Absolute: 0.1 10*3/uL (ref 0.0–0.1)
Basophils Relative: 1 %
Eosinophils Absolute: 0.3 10*3/uL (ref 0.0–0.5)
Eosinophils Relative: 3 %
HCT: 46.3 % (ref 39.0–52.0)
Hemoglobin: 14.2 g/dL (ref 13.0–17.0)
Immature Granulocytes: 1 %
Lymphocytes Relative: 17 %
Lymphs Abs: 1.5 10*3/uL (ref 0.7–4.0)
MCH: 26.9 pg (ref 26.0–34.0)
MCHC: 30.7 g/dL (ref 30.0–36.0)
MCV: 87.9 fL (ref 80.0–100.0)
Monocytes Absolute: 0.9 10*3/uL (ref 0.1–1.0)
Monocytes Relative: 11 %
Neutro Abs: 5.6 10*3/uL (ref 1.7–7.7)
Neutrophils Relative %: 67 %
Platelets: 270 10*3/uL (ref 150–400)
RBC: 5.27 MIL/uL (ref 4.22–5.81)
RDW: 18.9 % — ABNORMAL HIGH (ref 11.5–15.5)
WBC: 8.3 10*3/uL (ref 4.0–10.5)
nRBC: 0 % (ref 0.0–0.2)

## 2022-09-28 LAB — COMPREHENSIVE METABOLIC PANEL
ALT: 18 U/L (ref 0–44)
AST: 24 U/L (ref 15–41)
Albumin: 3.3 g/dL — ABNORMAL LOW (ref 3.5–5.0)
Alkaline Phosphatase: 93 U/L (ref 38–126)
Anion gap: 10 (ref 5–15)
BUN: 40 mg/dL — ABNORMAL HIGH (ref 8–23)
CO2: 27 mmol/L (ref 22–32)
Calcium: 8.8 mg/dL — ABNORMAL LOW (ref 8.9–10.3)
Chloride: 103 mmol/L (ref 98–111)
Creatinine, Ser: 1.98 mg/dL — ABNORMAL HIGH (ref 0.61–1.24)
GFR, Estimated: 33 mL/min — ABNORMAL LOW (ref >60)
Glucose, Bld: 101 mg/dL — ABNORMAL HIGH (ref 70–99)
Potassium: 4 mmol/L (ref 3.5–5.1)
Sodium: 140 mmol/L (ref 135–145)
Total Bilirubin: 1.1 mg/dL (ref 0.3–1.2)
Total Protein: 7.7 g/dL (ref 6.5–8.1)

## 2022-09-28 LAB — PROTIME-INR
INR: 2.3 — ABNORMAL HIGH (ref 0.8–1.2)
Prothrombin Time: 25.6 seconds — ABNORMAL HIGH (ref 11.4–15.2)

## 2022-09-28 LAB — LACTIC ACID, PLASMA: Lactic Acid, Venous: 1.5 mmol/L (ref 0.5–1.9)

## 2022-09-28 NOTE — ED Triage Notes (Signed)
Pt to ED for wound infection to right foot,  states was sent by doctor d.t not being able to find pulses in foot.  Pt right foot swollen, edema noted. Pulses unable to be palpated by this RN

## 2022-09-28 NOTE — ED Provider Notes (Signed)
[facility] Provider Note    Event Date/Time   First MD Initiated Contact with Patient 09/28/22 1716     (approximate)   History   Concern for arterial disease   HPI  [name] is a 82 y.o. male   who presents to the emergency department today at the advice of cardiology office because of concerns for lack of palpable pulses at the podiatry office today.  Patient was seen in podiatry because of a wound to his right foot.  Patient states that podiatrist prescribed antibiotics and was going to schedule follow-up.  Apparently had contacted cardiology office for follow-up given lack of palpable pulses.  After patient left podiatry office got a call from cardiology office recommended to come to the emergency department.  Did review podiatry notes and per their notes they have not been able to fill PT pulses bilaterally since August of last year.  It is unclear why they wanted him to follow-up with cardiology at this time.  Unfortunately cannot find any recent documentation of pulses from cardiology office.      Physical Exam   Triage Vital Signs: ED Triage Vitals  Enc Vitals Group     BP 09/28/22 1719 (!) 121/51     Pulse Rate 09/28/22 1719 (!) 56     Resp 09/28/22 1718 16     Temp 09/28/22 1718 98.1 F (36.7 C)     Temp src --      SpO2 09/28/22 1719 98 %     Weight 09/28/22 1719 150 lb (68 kg)     Height 09/28/22 1719 5\' 9"  (1.753 m)     Head Circumference --      Peak Flow --      Pain Score 09/28/22 1719 7     Pain Loc --      Pain Edu? --      Excl. in GC? --     Most recent vital signs: Vitals:   09/28/22 1718 09/28/22 1719  BP:  (!) 121/51  Pulse:  (!) 56  Resp: 16   Temp: 98.1 F (36.7 C) 98.1 F (36.7 C)  SpO2:  98%   General: Awake, alert, oriented. CV:  No palpable pulses in right foot, but could doppler both DP and PT. Resp:  Normal effort.  Abd:  No distention.    ED Results / Procedures / Treatments   Labs (all labs  ordered are listed, but only abnormal results are displayed) Labs Reviewed  LACTIC ACID, PLASMA  LACTIC ACID, PLASMA  COMPREHENSIVE METABOLIC PANEL  CBC WITH DIFFERENTIAL/PLATELET     EKG  None   RADIOLOGY None   PROCEDURES:  Critical Care performed: No   MEDICATIONS ORDERED IN ED: Medications - No data to display   IMPRESSION / MDM / ASSESSMENT AND PLAN / ED COURSE  I reviewed the triage vital signs and the nursing notes.                              Differential diagnosis includes, but is not limited to, PAD, acute occlusion  Patient's presentation is most consistent with acute presentation with potential threat to life or bodily function.  Patient presents to the emergency department today at the advice of cardiology office because of concerns for nonpalpable right foot pulses.  Per documentation it appears they have not been palpable for roughly 10 months.  On exam today while I cannot  palpate the pulses I was able to Doppler them.  Patient already has care plan in place for wound to his foot.  Did reach out to both cardiology and podiatry on call.  At this time do not feel any further emergent workup is necessary.  Will plan on discharging to continue outpatient follow-up.     FINAL CLINICAL IMPRESSION(S) / ED DIAGNOSES   Final diagnoses:  PAD (peripheral artery disease) (HCC)     Note:  This document was prepared using Dragon voice recognition software and may include unintentional dictation errors.    [name] Semen, MD 09/28/22 2015

## 2022-09-28 NOTE — ED Notes (Signed)
Pt provided discharge instructions and prescription information. Pt was given the opportunity to ask questions and questions were answered.   

## 2022-09-28 NOTE — Discharge Instructions (Signed)
Please seek medical attention for any high fevers, chest pain, shortness of breath, change in behavior, persistent vomiting, bloody stool or any other new or concerning symptoms.  

## 2022-09-28 NOTE — Telephone Encounter (Signed)
Thank you. I will follow along.

## 2022-09-28 NOTE — Telephone Encounter (Signed)
Dr. Tawanna Cooler Cline's office called, schedule pt for an appt due to pt lost his circulation on hs right foot. They wanted for Dr. Kirke Corin to know. They schedule pt on 06/11 with [facility]

## 2022-09-28 NOTE — Telephone Encounter (Signed)
Pt's wife LVM reporting pt lost circulation in R foot. She reported the pt was advised to go to ER by cardiology, so they are heading there. She reports she is hoping they will check INR there and that pt will most likely not be able to make his coumadin clinic apt tomorrow.    Cancelled apt and LVM with pt's wife, [name], if anything is needed to contact the coumadin clinic.

## 2022-09-28 NOTE — Telephone Encounter (Addendum)
Verbal discussion with DOD in office and notified of call. Advised that patient should go to the ED for further evaluation.

## 2022-09-28 NOTE — Telephone Encounter (Signed)
Spoke with Dr. Dory Larsen nurse and inquired if patient had a pulse. She did not know and would have to get information from Dr. Alberteen Spindle. She consulted with Dr. Alberteen Spindle at which point she reports he had no pulse. Given the loss of circulation advised that we would instruct patient to proceed to ED.   Spoke with patient and then his wife. Reviewed that given the loss of circulation to that right foot he will need to proceed to ED. Patient states he does not have transportation. Expressed importance of going to ED and that he should call 911 for them to come pick him up. He then handed phone to his wife so I could speak with her. Reviewed information with her and she will try to get someone to take them to the ER. Recommended that they call 911 if they are unable to arrange transportation. Advised that I would call ED Charge nurse to make them aware he is coming. She was appreciative for the call and had no further questions at this time.   Charge nurse Marylene Land RN has been notified.

## 2022-09-29 ENCOUNTER — Ambulatory Visit: Payer: Medicare Other

## 2022-09-29 ENCOUNTER — Ambulatory Visit (INDEPENDENT_AMBULATORY_CARE_PROVIDER_SITE_OTHER): Payer: Medicare Other

## 2022-09-29 DIAGNOSIS — Z7901 Long term (current) use of anticoagulants: Secondary | ICD-10-CM

## 2022-09-29 NOTE — Patient Instructions (Addendum)
Pre visit review using our clinic review tool, if applicable. No additional management support is needed unless otherwise documented below in the visit note.  Continue 1/2 tablet daily except take nothing on Saturdays. Recheck in 2 weeks.

## 2022-09-29 NOTE — Progress Notes (Addendum)
Pt was in ER due to loss of pulse in foot. This has been a chronic problem. Pt started Augmentin.   Continue 1/2 tablet daily except take nothing on Saturdays. Recheck in 2 weeks.  Contacted pt's wife, [name], and advised to continue normal dosing and scheduled next coumadin clinic apt. [name] verbalized understanding.

## 2022-10-03 ENCOUNTER — Telehealth: Payer: Self-pay

## 2022-10-03 NOTE — Telephone Encounter (Signed)
Transition Care Management Follow-up Telephone Call Date of discharge and from where: [facility] 6/4 How have you been since you were released from the hospital? Not  feeling well Any questions or concerns? No  Items Reviewed: Did the pt receive and understand the discharge instructions provided? Yes  Medications obtained and verified? Yes  Other? No  Any new allergies since your discharge? No  Dietary orders reviewed? No Do you have support at home? Yes     Follow up appointments reviewed:  PCP Hospital f/u appt confirmed? No  Scheduled to see  on  @ . Specialist Hospital f/u appt confirmed? Yes  Scheduled to see  on 6/11 @ . Are transportation arrangements needed? No  If their condition worsens, is the pt aware to call PCP or go to the Emergency Dept.? Yes Was the patient provided with contact information for the PCP's office or ED? Yes Was to pt encouraged to call back with questions or concerns? Yes

## 2022-10-04 ENCOUNTER — Encounter: Payer: Self-pay | Admitting: Medical

## 2022-10-04 ENCOUNTER — Ambulatory Visit
Admission: RE | Admit: 2022-10-04 | Discharge: 2022-10-04 | Disposition: A | Discharge status: Home / Self Care | Payer: Medicare Other | Source: Ambulatory Visit | Attending: Medical | Admitting: Medical

## 2022-10-04 ENCOUNTER — Ambulatory Visit: Payer: Medicare Other | Attending: Medical | Admitting: Medical

## 2022-10-04 VITALS — BP 127/62 | HR 56 | Ht 69.0 in | Wt 152.0 lb

## 2022-10-04 DIAGNOSIS — N184 Chronic kidney disease, stage 4 (severe): Secondary | ICD-10-CM | POA: Insufficient documentation

## 2022-10-04 DIAGNOSIS — I350 Nonrheumatic aortic (valve) stenosis: Secondary | ICD-10-CM | POA: Diagnosis not present

## 2022-10-04 DIAGNOSIS — E782 Mixed hyperlipidemia: Secondary | ICD-10-CM | POA: Diagnosis not present

## 2022-10-04 DIAGNOSIS — M7989 Other specified soft tissue disorders: Secondary | ICD-10-CM | POA: Insufficient documentation

## 2022-10-04 DIAGNOSIS — I5022 Chronic systolic (congestive) heart failure: Secondary | ICD-10-CM | POA: Diagnosis not present

## 2022-10-04 DIAGNOSIS — I4819 Other persistent atrial fibrillation: Secondary | ICD-10-CM | POA: Diagnosis not present

## 2022-10-04 DIAGNOSIS — M79671 Pain in right foot: Secondary | ICD-10-CM | POA: Insufficient documentation

## 2022-10-04 DIAGNOSIS — I701 Atherosclerosis of renal artery: Secondary | ICD-10-CM | POA: Diagnosis not present

## 2022-10-04 DIAGNOSIS — I739 Peripheral vascular disease, unspecified: Secondary | ICD-10-CM | POA: Diagnosis not present

## 2022-10-04 NOTE — Patient Instructions (Signed)
Medication Instructions:  Your Physician recommend you continue on your current medication as directed.     *If you need a refill on your cardiac medications before your next appointment, please call your pharmacy*   Lab Work: None ordered today   Testing/Procedures: Your physician has requested that you have a peripheral vascular angiogram. This exam is performed at the hospital. During this exam IV contrast is used to look at arterial blood flow. Please review the information sheet given for details.   Your physician has requested that you have a lower venous duplex. This test is an ultrasound of the veins in the legs or arms. It looks at venous blood flow that carries blood from the heart to the legs or arms. Allow one hour for a Lower Venous exam. Allow thirty minutes for an Upper Venous exam. There are no restrictions or special instructions.   Follow-Up: At [facility], you and your health needs are our priority.  As part of our continuing mission to provide you with exceptional heart care, we have created designated Provider Care Teams.  These Care Teams include your primary Cardiologist (physician) and Advanced Practice Providers (APPs -  Physician Assistants and Nurse Practitioners) who all work together to provide you with the care you need, when you need it.  We recommend signing up for the patient portal called "MyChart".  Sign up information is provided on this After Visit Summary.  MyChart is used to connect with patients for Virtual Visits (Telemedicine).  Patients are able to view lab/test results, encounter notes, upcoming appointments, etc.  Non-urgent messages can be sent to your provider as well.   To learn more about what you can do with MyChart, go to [email or web address].    Your next appointment:   4 week(s)  Provider:   Lorine Bears, MD      [facility] A DEPT OF [facility] AT [facility] [name] [place] [address] Dept: [phone] Loc: [phone]  [name]  10/04/2022  You are scheduled for a Peripheral Angiogram on Wednesday, June 26 with Dr. Lorine [name].  1. Please arrive at the [facility] (Main Entrance A) at [facility]: [address] [place], [place] [address] at 6:00 AM (This time is 4 hour(s) before your procedure to ensure your preparation). Free valet parking service is available. You will check in at ADMITTING. The support person will be asked to wait in the waiting room.  It is OK to have someone drop you off and come back when you are ready to be discharged.    Special note: Every effort is made to have your procedure done on time. Please understand that emergencies sometimes delay scheduled procedures.  2. Diet: Do not eat solid foods after midnight.  The patient may have clear liquids until 5am upon the day of the procedure.  3. Labs: Labs completed  4. Medication instructions in preparation for your procedure:   Contrast Allergy: No  Hold Morning of Procedure Lasix Jardiance  We will contact you regarding instructions for Coumadin  On the morning of your procedure, take any morning medicines NOT listed above.  You may use sips of water.  5. Plan to go home the same day, you will only stay overnight if medically necessary. 6. Bring a current list of your medications and current insurance cards. 7. You MUST have a responsible person to drive you home. 8. Someone MUST be  with you the first 24 hours after you arrive home or your discharge will be delayed. 9. Please wear clothes that are easy to get on and off and wear slip-on shoes.  Thank you for allowing [place] to care for you!   -- [facility]

## 2022-10-04 NOTE — Progress Notes (Signed)
Cardiology Office Note:    Date:  10/04/2022   ID:  [name], DOB 25-May-1940, MRN [id number]  PCP:  [name], MD  [facility] Cardiologist:  Lorine Bears, MD  [facility] Electrophysiologist:  Lanier Prude, MD   Referring MD: [name], MD   Chief Complaint: ER follow-up  History of Present Illness:    [name] is a 82 y.o. male with a hx of PAD, CAD s/p CABG, CKD stage 3-4, renal artery stenosis s/p left renal artery stenting, COPD, intraventricular hemorrhage, aortic stenosis, PAF, h/o DVTs on warfarin with IVC filter, bilateral carotid artery stenosis, s/p left subclavian artery and left axillary artery stenting, tobacco use and chronic heart failure.   The patient underwent CABG x 3 in 2013 with placement of a LIMA-LAD, SVG-RPDA, and SVG-OM. He suffered an intracranial hemorrhage in 2014 which was suspected to be due to malignant Hypertension and DAPT. Shortly thereafter he was diagnosed with extensive right-sided DVT and underwent IVC filter in the setting of contraindication for anticoagulation.   In 2018, he had extensive bilateral occlusive DVTs, which required catheter-based therapy and he has been on anticoagulation since then with warfarin. Unable to afford DOAC.   ER was previously as low as 35-40% prior to his bypass surgery with subsequent improvement to 55-60% by echo in 2018.   Cath in 02/2021 showed 3/3 patent grafts with known occlusive left main and RCA disease. AS was moderate by gradient ; AVA 1.1cm2. RHC showed elevated filling pressures. Due to worsening claudication at follow-up, he underwent repeat ABIs which were abnormal at 0.61 on the right and 0.56 on the left. Lower extremity angiography was performed January 2023 which showed patent left renal artery stent with moderate right renal artery stenosis, patent bilateral iliac kissing stents extending into the distal aorta with mild restenosis, subtotal occlusion of the  inferior branch of the right profunda with diffuse calcified SFA disease and collaterals from te profunda and 3V runoff below the knee.   Admitted 10/23 after a fall found to have right rib fracture, pneumothorax and subcutaneous emphysema  CT showed lung contusion. Recovered with supportive care. Hospitalization c/b AF with RVR, rate controlled with Coreg.     Admitted 03/23/22 due to acute on chronic diastolic HF w/ significant volume overload.    Echo 03/24/22 EF 50-55% along with mild/moderate MR, moderate AS with severe calcification of aortic valve.    In 12/23, seen in HF Clinic for massive fluid overload.  Started on Furoscix x 4 days with 2 days of metolazone as well. He diuresed 15-20 pounds. BP low but no orthostasis. Edema resolved. We scheduled R/L cath (pressures only) to assess hemodynamics and AS but he cancelled.    AF felt to be driving HF. Underwent TEE-guided DCCV to NSR in 1/24.  The TEE showed EF 40-45%, normal RV, low gradient moderate-severe aortic stenosis (visually severe but with mean gradient 13 mmHg). Had ERAF in 10 days. Repeat DC-CV on 06/14/22.    Last seen 06/20/22 was in NSR.   Has seen Dr. Kirke Corin for worsening claudication. Recommended exercise given limited revasc options  The patient saw the heart failure team 08/16/22 and volume status was improved. He was referred to the structural heart team for AS to evaluate for TAVR.  He saw the structural heart team 09/02/22 and the patient was not wanting to pursue extensive TAVR work-up and wanted to pursue a more palliative approach.   Today, the patient reports a recent ER  visit for difficulty feeling pulses in his right foot. He he has sores on his right foot and was at podiatry. The podiatrist was having trouble feeling a pulse and sent the patient to the ER. In the ER doppler was used and pulses were found. He was discharged home. The wound between the toes was not healing well. Pain started off in the toes and  migrated to the whole foot. Also skin is very sensitive. This all worsened in the last month. Most recent INR 2.3. He reports he has required bridging in the past.  Past Medical History:  Diagnosis Date   (HFimpEF) heart failure with improved ejection fraction (HCC)    a. 06/2011 Echo: EF 35-40%; b. 06/2012 Echo: EF 50%; c. 12/2016 Echo: EF 55-60%, no rwma, GRI DD, mild AS/MR, nl RV fxn, nl RVSP; d. 02/2021 Echo: EF 55%, GrII DD. Mild MR. Mild to mod TR. Sev AS by VTI.   AAA (abdominal aortic aneurysm) (HCC)    a. 05/2020 Abd u/s: 3.6cm.   Aortic calcification (HCC) 05/01/2013   Bilateral renal artery stenosis (HCC)    a. 06/2013 Angio/PTA: LRA: 95 (5x18 Herculink stent), RRA 60ost; b. 04/2021 Angio: mod RRA stenosis w/ patent LRA stent.   Carotid arterial disease (HCC)    a. 05/2020 Carotid U/S: RICA 1-39%, LICA 40-59%   CHF (congestive heart failure) (HCC)    Chronic kidney disease    COPD (chronic obstructive pulmonary disease) (HCC)    Coronary artery disease    a. 2013 s/p CABG x 3 (LIMA->LAD, VG->OM, VG->RPDA; b. 06/2012 MV: no ischemia; c. 02/2021 Cath: LM 100, RCA 90ost, 162m, free LIMA->LAD nl, VG->OM2 nl, VG->RPDA nl. Mod AS (mean grad , AVA 1.1cm^2). RHC w/ elev filling pressures.   History of prior cigarette smoking 04/11/2008   Qualifier: Diagnosis of  By: Ermalene Searing MD, Amy     Hyperlipidemia    Hypertension    Hypothyroidism    Intraventricular hemorrhage (HCC) 07/12/2012   Ischemic cardiomyopathy    a. 06/2011 Echo: EF 35-40%; b. 06/2012 Echo: EF 50%; c. 12/2016 Echo: EF 55-60%, no rwma, GRI DD; d. 02/2021 Echo: Ef 55%, GrII DD.   Moderate aortic stenosis    a. 02/2021 Echo: EF 55%, GrII DD. Sev Ca2+ of AoV. Sev AS w/ AVA by VTI 0.79cm^2; b. 02/2021 Cath: Mod AS w/ mean grad . AVA 1.1cm^2.   PAF (paroxysmal atrial fibrillation) (HCC)    Peripheral arterial disease (HCC)    a. Previous left lower extremity stenting by Dr. Evette Cristal;  b. 12/2012 s/p bilat ostial common iliac  stenting; c. 04/2021 Angio: Patent LRA stent, mod RRA stenosis. Small AAA. Sev plaque RSFA 2/ subtl occl of inf branch of profunda. Diff Ca2+ SFA dzs w/ collats from profunda and 3V runoff. Diffuse LSFA dzs w/ collats from profunda. Subtl PT dzs. 3V runoff-->Med Rx.   Right leg DVT (HCC) 08/13/2012   Subclavian artery stenosis, left (HCC) 05/2012   Status post stenting of the ostium and self-expanding stent placement to the left axillary artery   Venous insufficiency     Past Surgical History:  Procedure Laterality Date   ABDOMINAL ANGIOGRAM  07/10/2013   WITH BI-FEMORAL RUNOFF       DR Kirke Corin   ABDOMINAL AORTAGRAM N/A 12/26/2012   Procedure: ABDOMINAL Ronny Flurry;  Surgeon: [name], MD;  Location: [facility];  Service: Cardiovascular;  Laterality: N/A;   ABDOMINAL AORTAGRAM N/A 07/10/2013   Procedure: ABDOMINAL Ronny Flurry;  Surgeon: [name],  MD;  Location: [facility];  Service: Cardiovascular;  Laterality: N/A;   ABDOMINAL AORTOGRAM W/LOWER EXTREMITY N/A 05/12/2021   Procedure: ABDOMINAL AORTOGRAM W/LOWER EXTREMITY;  Surgeon: [name], MD;  Location: [facility] IN[facility];  Service: Cardiovascular;  Laterality: N/A;   ANGIOPLASTY / STENTING FEMORAL  05/2012   ANGIOPLASTY / STENTING ILIAC Bilateral 12/26/2012   ARCH AORTOGRAM N/A 06/20/2012   Procedure: ARCH AORTOGRAM;  Surgeon: [name], MD;  Location: [facility];  Service: Cardiovascular;  Laterality: N/A;   CARDIAC CATHETERIZATION  05/2012   [facility]   CARDIOVERSION N/A 05/23/2022   Procedure: CARDIOVERSION;  Surgeon: Dolores Patty, MD;  Location: [facility] ORS;  Service: Cardiovascular;  Laterality: N/A;   CARDIOVERSION N/A 06/14/2022   Procedure: CARDIOVERSION;  Surgeon: Dolores Patty, MD;  Location: [facility] ORS;  Service: Cardiovascular;  Laterality: N/A;   CHOLECYSTECTOMY  1990's   CORONARY ARTERY BYPASS GRAFT  07/11/2011   Procedure: CORONARY ARTERY BYPASS GRAFTING (CABG);  Surgeon: [name],  MD;  Location: [facility];  Service: Open Heart Surgery;  Laterality: N/A;  Times 3. On Pump. Using right greater saphenous vein and left internal mammary artery.    CORONARY ARTERY BYPASS GRAFT     HERNIA REPAIR     INSERTION OF ILIAC STENT Bilateral 12/26/2012   Procedure: INSERTION OF ILIAC STENT;  Surgeon: [name], MD;  Location: [facility];  Service: Cardiovascular;  Laterality: Bilateral;  Bilateral Common Iliac Artery   LEFT AND RIGHT HEART CATHETERIZATION WITH CORONARY ANGIOGRAM  07/07/2011   Procedure: LEFT AND RIGHT HEART CATHETERIZATION WITH CORONARY ANGIOGRAM;  Surgeon: [name], MD;  Location: [facility];  Service: Cardiovascular;;   LOWER EXTREMITY VENOGRAPHY Bilateral 12/30/2016   Procedure: Lower Extremity Venography;  Surgeon: Renford Dills, MD;  Location: [facility] INVASIVE CV LAB;  Service: Cardiovascular;  Laterality: Bilateral;   PERCUTANEOUS STENT INTERVENTION  06/20/2012   Procedure: PERCUTANEOUS STENT INTERVENTION;  Surgeon: [name], MD;  Location: [facility];  Service: Cardiovascular;;   RENAL ARTERY ANGIOPLASTY Left 07/10/2013   DR ARIDA   RIGHT/LEFT HEART CATH AND CORONARY/GRAFT ANGIOGRAPHY N/A 03/15/2021   Procedure: RIGHT/LEFT HEART CATH AND CORONARY/GRAFT ANGIOGRAPHY;  Surgeon: [name], MD;  Location: [facility] INVAS[facility] CV LAB;  Service: Cardiovascular;  Laterality: N/A;   SUBCLAVIAN ARTERY STENT  05/2012   "2 stents" (12/26/2012)   TEE WITHOUT CARDIOVERSION N/A 05/23/2022   Procedure: TRANSESOPHAGEAL ECHOCARDIOGRAM (TEE);  Surgeon: Dolores Patty, MD;  Location: [facility] ORS;  Service: Cardiovascular;  Laterality: N/A;   TOOTH EXTRACTION  Spring 2017   mulitple (6)   VENA CAVA FILTER PLACEMENT  07/2012   Removable    Current Medications: Current Meds  Medication Sig   albuterol (VENTOLIN HFA) 108 (90 Base) MCG/ACT inhaler Inhale 2 puffs into the lungs every 6 (six) hours as needed for wheezing or shortness of breath.   amiodarone  (PACERONE) 200 MG tablet TAKE 1 TABLET BY MOUTH TWICE A DAY   amoxicillin-clavulanate (AUGMENTIN) 875-125 MG tablet Take 1 tablet by mouth 2 (two) times daily.   atorvastatin (LIPITOR) 20 MG tablet TAKE 1 TABLET BY MOUTH EVERY DAY   augmented betamethasone dipropionate (DIPROLENE-AF) 0.05 % cream APPLY TO AFFECTED AREA TWICE A DAY   empagliflozin (JARDIANCE) 10 MG TABS tablet Take 1 tablet (10 mg total) by mouth daily before breakfast.   furosemide (LASIX) 40 MG tablet Take 1 tablet (40 mg total) by mouth 2 (two) times daily.  HYDROcodone-acetaminophen (NORCO/VICODIN) 5-325 MG tablet Take 1 tablet by mouth every 4 (four) hours as needed for moderate pain or severe pain.   levothyroxine (SYNTHROID) 150 MCG tablet TAKE ONE TABLET BY MOUTH EVERY DAY BEFORE BREAKFAST   metoprolol succinate (TOPROL XL) 25 MG 24 hr tablet Take 1 tablet (25 mg total) by mouth 2 (two) times daily.   warfarin (COUMADIN) 5 MG tablet Take 2.5 mg by mouth daily. 1/2 tab daily except 1 tab on mon,wed,frid     Allergies:   Plavix [clopidogrel bisulfate]   Social History   Socioeconomic History   Marital status: Married    Spouse name: Not on file   Number of children: 2   Years of education: Not on file   Highest education level: Not on file  Occupational History   Occupation: Retired    Associate Professor: RETIRED    Comment: Comptroller  Tobacco Use   Smoking status: Former    Packs/day: 1.00    Years: 60.00    Additional pack years: 0.00    Total pack years: 60.00    Types: Cigarettes    Quit date: 12/25/2010    Years since quitting: 11.7   Smokeless tobacco: Never  Vaping Use   Vaping Use: Never used  Substance and Sexual Activity   Alcohol use: No   Drug use: No   Sexual activity: Not Currently  Other Topics Concern   Not on file  Social History Narrative   Lives with wife, [name]   Exercising 3 times a week.    Moderate diet control.   2 dogs   Social Determinants of Health   Financial  Resource Strain: Low Risk  (07/13/2022)   Overall Financial Resource Strain (CARDIA)    Difficulty of Paying Living Expenses: Not hard at all  Food Insecurity: No Food Insecurity (07/13/2022)   Hunger Vital Sign    Worried About Running Out of Food in the Last Year: Never true    Ran Out of Food in the Last Year: Never true  Transportation Needs: No Transportation Needs (07/13/2022)   PRAPARE - Administrator, Civil Service (Medical): No    Lack of Transportation (Non-Medical): No  Physical Activity: Insufficiently Active (07/13/2022)   Exercise Vital Sign    Days of Exercise per Week: 7 days    Minutes of Exercise per Session: 20 min  Stress: No Stress Concern Present (07/13/2022)   [employer] of Occupational Health - Occupational Stress Questionnaire    Feeling of Stress : Not at all  Social Connections: Moderately Integrated (07/13/2022)   Social Connection and Isolation Panel [NHANES]    Frequency of Communication with Friends and Family: More than three times a week    Frequency of Social Gatherings with Friends and Family: More than three times a week    Attends Religious Services: Never    Database administrator or Organizations: Yes    Attends Engineer, structural: More than 4 times per year    Marital Status: Married     Family History: The patient's family history includes Alcohol abuse in his father; Cirrhosis in his father; Hypothyroidism in his sister.  ROS:   Please see the history of present illness.     All other systems reviewed and are negative.  EKGs/Labs/Other Studies Reviewed:    The following studies were reviewed today:  Echo TEE 04/2022  1. Left ventricular ejection fraction, by estimation, is 40 to 45%. The  left ventricle has  mildly decreased function.   2. Right ventricular systolic function is normal. The right ventricular  size is normal.   3. Left atrial size was moderately dilated. No left atrial/left atrial  appendage  thrombus was detected.   4. Right atrial size was mildly dilated.   5. The mitral valve is normal in structure. Mild mitral valve  regurgitation.   6. Visually AS appears severe but by gradient measures in mild to  moderate range. Unable to planimeter AoV orifcie as it was very eccentric.  The aortic valve is tricuspid. There is severe calcifcation of the aortic  valve. Aortic valve regurgitation is  trivial. Moderate to severe aortic valve stenosis. Aortic valve mean  gradient measures 13.3 mmHg. Aortic valve Vmax measures 2.26 m/s.   7. There is Severe (Grade IV) plaque involving the descending aorta.   8. Successful DC-CV   Echo 02/2022  1. Left ventricular ejection fraction, by estimation, is 50 to 55%. The  left ventricle has low normal function. The left ventricle has no regional  wall motion abnormalities. Left ventricular diastolic parameters are  indeterminate.   2. Right ventricular systolic function is normal. The right ventricular  size is normal.   3. The mitral valve is normal in structure. Mild to moderate mitral valve  regurgitation. No evidence of mitral stenosis.   4. The aortic valve is normal in structure. There is severe calcifcation  of the aortic valve. Aortic valve regurgitation is not visualized.  Moderate aortic valve stenosis. Aortic valve area, by VTI measures 1.52  cm. Aortic valve mean gradient measures  17.0 mmHg. Aortic valve Vmax measures 2.40 m/s.   5. The inferior vena cava is dilated in size with <50% respiratory  variability, suggesting right atrial pressure of 15 mmHg.   6. Frequent PVCs noted   Echo 01/2022  1. Left ventricular ejection fraction, by estimation, is 55 to 60%. The  left ventricle has normal function. The left ventricle has no regional  wall motion abnormalities. Left ventricular diastolic function could not  be evaluated.   2. Right ventricular systolic function is normal. The right ventricular  size is normal. There is  mildly elevated pulmonary artery systolic  pressure. The estimated right ventricular systolic pressure is 34.4 mmHg.   3. Left atrial size was mildly dilated.   4. Right atrial size was mildly dilated.   5. The mitral valve is normal in structure. Mild mitral valve  regurgitation. No evidence of mitral stenosis.   6. The aortic valve is tricuspid. There is severe calcifcation of the  aortic valve. Aortic valve regurgitation is not visualized. Mild to  moderate aortic valve stenosis. Aortic valve mean gradient measures 16.5  mmHg. Aortic valve Vmax measures 2.66 m/s.   7. The inferior vena cava is normal in size with greater than 50%  respiratory variability, suggesting right atrial pressure of 3 mmHg.    Heart monitor 01/2022 Patient had a min HR of 63 bpm, max HR of 187 bpm, and avg HR of 83 bpm.  Predominant underlying rhythm was Sinus Rhythm. Bundle Branch Block/IVCD was present.  22 Ventricular Tachycardia runs occurred, the run with the fastest interval lasting 4 beats with a max rate of 167 bpm, the longest lasting 6 beats with an avg rate of 104 bpm.  24 Supraventricular Tachycardia runs occurred, the run with the fastest interval lasting 14 beats with a max rate of 187 bpm, the longest lasting 20 beats with an avg rate of 111 bpm.  Occasional  PACs with a burden of 2.8% and frequent PVCs with a burden of 17%.   R/L heart cath 02/2021   Ost LM to Dist LM lesion is 100% stenosed.   Prox RCA to Mid RCA lesion is 100% stenosed.   Ost RCA to Prox RCA lesion is 90% stenosed.   SVG graft was visualized by angiography and is normal in caliber.   SVG graft was visualized by angiography and is normal in caliber.   LIMA graft was visualized by angiography and is normal in caliber.   The graft exhibits no disease.   The graft exhibits no disease.   1.  Occluded native vessels including left main and mid right coronary artery.  Patent and normal grafts including SVG to right PDA, SVG to OM 2  and free LIMA to LAD which is anastomosed to the proximal portion of SVG to OM. 2.  Moderate aortic stenosis with mean gradient of 15 mmHg and valve area of 1.1 cm. 3.  Right heart catheterization showed moderately elevated wedge pressure, mild pulmonary hypertension and mildly reduced cardiac output.   Recommendations: Continue medical therapy for coronary artery disease. Aortic stenosis does not require intervention at this point. There is evidence of diastolic heart failure with elevated filling pressures.  We will resume furosemide at 20 mg every other day. The patient reports a lot of calf claudication bilaterally and will evaluate with outpatient Dopplers.   Echo 02/2021 1. Left ventricular ejection fraction, by estimation, is 55 %. The left  ventricle has normal function. The left ventricle has no regional wall  motion abnormalities. Left ventricular diastolic parameters are consistent  with Grade II diastolic dysfunction   (pseudonormalization). The average left ventricular global longitudinal  strain is -11.4 %. The global longitudinal strain is abnormal.   2. Right ventricular systolic function is normal. The right ventricular  size is normal. There is normal pulmonary artery systolic pressure. The  estimated right ventricular systolic pressure is 24.0 mmHg.   3. Left atrial size was mildly dilated.   4. The mitral valve is normal in structure. Mild mitral valve  regurgitation. No evidence of mitral stenosis.   5. Tricuspid valve regurgitation is mild to moderate.   6. The aortic valve was not well visualized. There is severe calcifcation  of the aortic valve. Aortic valve regurgitation is not visualized. Mild  aortic valve stenosis by Aortic valve mean gradient measures 12.7 mmHg.  Aortic valve Vmax measures 2.33  m/s.(unchanged from prior study) Severe stenosis by Aortic valve area, by  VTI measures 0.79 cm.Consider TEE if clinically indicated.   7. There is borderline  dilatation of the aortic root, measuring 37 mm.   8. The inferior vena cava is normal in size with greater than 50%  respiratory variability, suggesting right atrial pressure of 3 mmHg.   Comparison(s): EF 55%, mild AS, mean gradient 12 mmHg, peak 20 mmHg.    EKG:  EKG is ordered today.  The ekg ordered today demonstrates SB 56bpm, RBBB, TWI III, aVF  Recent Labs: 02/08/2022: Magnesium 2.4 06/20/2022: B Natriuretic Peptide 585.2 08/04/2022: TSH 0.74 09/28/2022: ALT 18; BUN 40; Creatinine, Ser 1.98; Hemoglobin 14.2; Platelets 270; Potassium 4.0; Sodium 140  Recent Lipid Panel    Component Value Date/Time   CHOL 130 08/04/2022 0912   CHOL 125 01/31/2014 0756   TRIG 76.0 08/04/2022 0912   HDL 61.20 08/04/2022 0912   HDL 63 01/31/2014 0756   CHOLHDL 2 08/04/2022 0912   VLDL 15.2 08/04/2022 0912  LDLCALC 54 08/04/2022 0912   LDLCALC 46 01/31/2014 0756     Physical Exam:    VS:  BP 127/62 (BP Location: Right Arm, Patient Position: Sitting, Cuff Size: Normal)   Pulse (!) 56   Ht 5\' 9"  (1.753 m)   Wt 152 lb (68.9 kg)   SpO2 93%   BMI 22.45 kg/m     Wt Readings from Last 3 Encounters:  10/04/22 152 lb (68.9 kg)  09/28/22 150 lb (68 kg)  09/02/22 152 lb 12.8 oz (69.3 kg)     GEN:  Well nourished, well developed in no acute distress HEENT: Normal NECK: No JVD; No carotid bruits LYMPHATICS: No lymphadenopathy CARDIAC: RRR, +murmur, no rubs, gallops; distant pedal pulses RESPIRATORY:  Clear to auscultation without rales, wheezing or rhonchi  ABDOMEN: Soft, non-tender, non-distended MUSCULOSKELETAL:  right pedal edema;  SKIN: mottled right foot NEUROLOGIC:  Alert and oriented x 3 PSYCHIATRIC:  Normal affect   ASSESSMENT:    1. Leg swelling   2. PAD (peripheral artery disease) (HCC)   3. Right foot pain   4. CKD (chronic kidney disease) stage 4, GFR 15-29 ml/min (HCC)   5. Bilateral renal artery stenosis (HCC)   6. Chronic systolic heart failure (HCC)   7. Aortic valve  stenosis, etiology of cardiac valve disease unspecified   8. Persistent atrial fibrillation (HCC)   9. Hyperlipidemia, mixed    PLAN:    In order of problems listed above:  Right foot pain/discoloration/non-healing wound/swelling PAD H/o DVT on lifelong warfarin with IV filter Patient has complicated PAD history with multiple stents. He had prior iliac stenting and known SFA/popliteal disease. Revascularization options are not straight forward. It is not felt he is a good surgical candidate. Most recent ABIs in 07/2024 showed moderate right and mild left lower extremity disease. Patient went to the ED for possible loss of pulses on the right side. In the ER pulses were found with Dopplers. The patient reports foot discoloration, non-healing wound and foot pain for the last month. On exam foot is mottled with edema. Pulses very distant. He is on warfarin, most recent INR 2.3. Spoke with Dr. Kirke Corin, who recommended abdominal aortogram with LE angiography. Will discuss bridging with MD. I will order STAT DVT study.   Addendum: STAT DVT study was negative. The patient was notified of the results.   Addendum: After review of prior procedures, patient underwent bridging with Lovenox in 2022 for R/L hart cath, but for prior PV procedures, patient has not undergone bridging. Therefore, we will not need bridging. Plan to hold warfarin 5 days prior to procedure and start ASA 81mg  daily.   CKD stage IV Plan for IVF prior to LE angiography.   Bilateral renal artery stenosis S/p left renal artery stenting. Stent was patent on most recent angiography.  Chronic HF with mid range EF  Most recent TEE 04/2022 showed LVEF 40-45%. Patient follows with advanced heart failure team. He has right pedal edema as above. He takes lasix 40mg  daily, Jardiance 10mg  daily and Toprol 25mg  BID. No MRA with CKD stage 4. No changes at this time.   Moderate AS Patient saw structural heart team to discuss TAVR, but patient would  like to pursue palliative approach.   Persistent Afib Patient is s/p DCCV x2. He is on amiodarone 200mg  BID. He is iin NSR on exam. Continue warfarin for stroke ppx and Toprol for rate control.  CAD s/p CABG and multiple PCIs Patient denies chest pain. Grafts were patent  on cath in 2022. No further work-up at this time. Continue Lipitor 20mg  daily and Toprol 25mg  daily. No ASA with warfarin.    HLD LDL 50. Continue Lipitor 20mg  daily.  Disposition: Follow up in 3-4 week(s) with MD/APP   Signed, Khanh Tanori Ryett Stall, PA-C  10/04/2022 9:01 PM    [facility]

## 2022-10-05 MED ORDER — SODIUM CHLORIDE 0.9% FLUSH
3.0000 mL | Freq: Two times a day (BID) | INTRAVENOUS | Status: AC
Start: 2022-10-05 — End: ?

## 2022-10-05 NOTE — Addendum Note (Signed)
Addended by: Marianne Sofia on: 10/05/2022 11:59 AM   Modules accepted: Orders

## 2022-10-06 ENCOUNTER — Telehealth: Payer: Self-pay

## 2022-10-06 NOTE — Telephone Encounter (Signed)
Per Cadence, pt to hold warfarin 4 days prior to procedure and start ASA 81 mg daily in the interim. Pt's wife made aware and verbalized understanding.

## 2022-10-11 ENCOUNTER — Telehealth: Payer: Self-pay | Admitting: Cardiology

## 2022-10-11 ENCOUNTER — Emergency Department (HOSPITAL_COMMUNITY): Payer: Medicare Other

## 2022-10-11 ENCOUNTER — Other Ambulatory Visit: Payer: Self-pay

## 2022-10-11 ENCOUNTER — Inpatient Hospital Stay (HOSPITAL_COMMUNITY)
Admission: EM | Admit: 2022-10-11 | Discharge: 2022-10-31 | DRG: 239 | Disposition: A | Discharge status: Skilled Nursing Facility | Payer: Medicare Other | Source: Ambulatory Visit | Attending: Internal Medicine | Admitting: Internal Medicine

## 2022-10-11 DIAGNOSIS — I5042 Chronic combined systolic (congestive) and diastolic (congestive) heart failure: Secondary | ICD-10-CM | POA: Diagnosis present

## 2022-10-11 DIAGNOSIS — R001 Bradycardia, unspecified: Secondary | ICD-10-CM | POA: Diagnosis not present

## 2022-10-11 DIAGNOSIS — J80 Acute respiratory distress syndrome: Secondary | ICD-10-CM | POA: Diagnosis not present

## 2022-10-11 DIAGNOSIS — R41841 Cognitive communication deficit: Secondary | ICD-10-CM | POA: Diagnosis not present

## 2022-10-11 DIAGNOSIS — E039 Hypothyroidism, unspecified: Secondary | ICD-10-CM | POA: Diagnosis not present

## 2022-10-11 DIAGNOSIS — I10 Essential (primary) hypertension: Secondary | ICD-10-CM | POA: Diagnosis not present

## 2022-10-11 DIAGNOSIS — N1831 Chronic kidney disease, stage 3a: Secondary | ICD-10-CM | POA: Diagnosis not present

## 2022-10-11 DIAGNOSIS — I70221 Atherosclerosis of native arteries of extremities with rest pain, right leg: Secondary | ICD-10-CM | POA: Diagnosis not present

## 2022-10-11 DIAGNOSIS — I272 Pulmonary hypertension, unspecified: Secondary | ICD-10-CM | POA: Diagnosis present

## 2022-10-11 DIAGNOSIS — Z86718 Personal history of other venous thrombosis and embolism: Secondary | ICD-10-CM

## 2022-10-11 DIAGNOSIS — I70235 Atherosclerosis of native arteries of right leg with ulceration of other part of foot: Secondary | ICD-10-CM | POA: Diagnosis not present

## 2022-10-11 DIAGNOSIS — Z87891 Personal history of nicotine dependence: Secondary | ICD-10-CM | POA: Diagnosis not present

## 2022-10-11 DIAGNOSIS — Z7901 Long term (current) use of anticoagulants: Secondary | ICD-10-CM

## 2022-10-11 DIAGNOSIS — N1832 Chronic kidney disease, stage 3b: Secondary | ICD-10-CM | POA: Diagnosis present

## 2022-10-11 DIAGNOSIS — I350 Nonrheumatic aortic (valve) stenosis: Secondary | ICD-10-CM | POA: Diagnosis not present

## 2022-10-11 DIAGNOSIS — I2510 Atherosclerotic heart disease of native coronary artery without angina pectoris: Secondary | ICD-10-CM | POA: Diagnosis present

## 2022-10-11 DIAGNOSIS — Z79890 Hormone replacement therapy (postmenopausal): Secondary | ICD-10-CM

## 2022-10-11 DIAGNOSIS — L89156 Pressure-induced deep tissue damage of sacral region: Secondary | ICD-10-CM | POA: Diagnosis not present

## 2022-10-11 DIAGNOSIS — R571 Hypovolemic shock: Secondary | ICD-10-CM | POA: Diagnosis not present

## 2022-10-11 DIAGNOSIS — M6281 Muscle weakness (generalized): Secondary | ICD-10-CM | POA: Diagnosis not present

## 2022-10-11 DIAGNOSIS — I6523 Occlusion and stenosis of bilateral carotid arteries: Secondary | ICD-10-CM | POA: Diagnosis present

## 2022-10-11 DIAGNOSIS — E785 Hyperlipidemia, unspecified: Secondary | ICD-10-CM | POA: Diagnosis present

## 2022-10-11 DIAGNOSIS — D849 Immunodeficiency, unspecified: Secondary | ICD-10-CM | POA: Diagnosis not present

## 2022-10-11 DIAGNOSIS — E038 Other specified hypothyroidism: Secondary | ICD-10-CM | POA: Diagnosis not present

## 2022-10-11 DIAGNOSIS — H547 Unspecified visual loss: Secondary | ICD-10-CM | POA: Diagnosis present

## 2022-10-11 DIAGNOSIS — J189 Pneumonia, unspecified organism: Secondary | ICD-10-CM | POA: Diagnosis not present

## 2022-10-11 DIAGNOSIS — L97519 Non-pressure chronic ulcer of other part of right foot with unspecified severity: Secondary | ICD-10-CM | POA: Diagnosis present

## 2022-10-11 DIAGNOSIS — I4819 Other persistent atrial fibrillation: Secondary | ICD-10-CM | POA: Diagnosis not present

## 2022-10-11 DIAGNOSIS — R6521 Severe sepsis with septic shock: Secondary | ICD-10-CM | POA: Diagnosis not present

## 2022-10-11 DIAGNOSIS — Z8673 Personal history of transient ischemic attack (TIA), and cerebral infarction without residual deficits: Secondary | ICD-10-CM

## 2022-10-11 DIAGNOSIS — R739 Hyperglycemia, unspecified: Secondary | ICD-10-CM | POA: Diagnosis not present

## 2022-10-11 DIAGNOSIS — I480 Paroxysmal atrial fibrillation: Secondary | ICD-10-CM | POA: Diagnosis present

## 2022-10-11 DIAGNOSIS — Z888 Allergy status to other drugs, medicaments and biological substances status: Secondary | ICD-10-CM

## 2022-10-11 DIAGNOSIS — Z6822 Body mass index (BMI) 22.0-22.9, adult: Secondary | ICD-10-CM

## 2022-10-11 DIAGNOSIS — R2689 Other abnormalities of gait and mobility: Secondary | ICD-10-CM | POA: Diagnosis not present

## 2022-10-11 DIAGNOSIS — T8112XA Postprocedural septic shock, initial encounter: Secondary | ICD-10-CM | POA: Diagnosis not present

## 2022-10-11 DIAGNOSIS — R0600 Dyspnea, unspecified: Secondary | ICD-10-CM | POA: Diagnosis not present

## 2022-10-11 DIAGNOSIS — T8144XA Sepsis following a procedure, initial encounter: Secondary | ICD-10-CM | POA: Diagnosis not present

## 2022-10-11 DIAGNOSIS — I5030 Unspecified diastolic (congestive) heart failure: Secondary | ICD-10-CM | POA: Diagnosis not present

## 2022-10-11 DIAGNOSIS — Z95828 Presence of other vascular implants and grafts: Secondary | ICD-10-CM

## 2022-10-11 DIAGNOSIS — I714 Abdominal aortic aneurysm, without rupture, unspecified: Secondary | ICD-10-CM | POA: Diagnosis present

## 2022-10-11 DIAGNOSIS — I872 Venous insufficiency (chronic) (peripheral): Secondary | ICD-10-CM | POA: Diagnosis present

## 2022-10-11 DIAGNOSIS — N183 Chronic kidney disease, stage 3 unspecified: Secondary | ICD-10-CM | POA: Diagnosis not present

## 2022-10-11 DIAGNOSIS — I998 Other disorder of circulatory system: Secondary | ICD-10-CM | POA: Diagnosis not present

## 2022-10-11 DIAGNOSIS — I130 Hypertensive heart and chronic kidney disease with heart failure and stage 1 through stage 4 chronic kidney disease, or unspecified chronic kidney disease: Secondary | ICD-10-CM | POA: Diagnosis present

## 2022-10-11 DIAGNOSIS — J9621 Acute and chronic respiratory failure with hypoxia: Secondary | ICD-10-CM | POA: Diagnosis not present

## 2022-10-11 DIAGNOSIS — L089 Local infection of the skin and subcutaneous tissue, unspecified: Secondary | ICD-10-CM | POA: Diagnosis present

## 2022-10-11 DIAGNOSIS — Z951 Presence of aortocoronary bypass graft: Secondary | ICD-10-CM

## 2022-10-11 DIAGNOSIS — E861 Hypovolemia: Secondary | ICD-10-CM | POA: Diagnosis not present

## 2022-10-11 DIAGNOSIS — I499 Cardiac arrhythmia, unspecified: Secondary | ICD-10-CM | POA: Diagnosis not present

## 2022-10-11 DIAGNOSIS — J431 Panlobular emphysema: Secondary | ICD-10-CM | POA: Diagnosis not present

## 2022-10-11 DIAGNOSIS — N280 Ischemia and infarction of kidney: Secondary | ICD-10-CM | POA: Diagnosis not present

## 2022-10-11 DIAGNOSIS — D631 Anemia in chronic kidney disease: Secondary | ICD-10-CM | POA: Diagnosis present

## 2022-10-11 DIAGNOSIS — Z01810 Encounter for preprocedural cardiovascular examination: Secondary | ICD-10-CM | POA: Diagnosis not present

## 2022-10-11 DIAGNOSIS — I7143 Infrarenal abdominal aortic aneurysm, without rupture: Secondary | ICD-10-CM | POA: Diagnosis not present

## 2022-10-11 DIAGNOSIS — I70261 Atherosclerosis of native arteries of extremities with gangrene, right leg: Principal | ICD-10-CM | POA: Diagnosis not present

## 2022-10-11 DIAGNOSIS — R531 Weakness: Secondary | ICD-10-CM | POA: Diagnosis not present

## 2022-10-11 DIAGNOSIS — I82409 Acute embolism and thrombosis of unspecified deep veins of unspecified lower extremity: Secondary | ICD-10-CM | POA: Diagnosis present

## 2022-10-11 DIAGNOSIS — J9622 Acute and chronic respiratory failure with hypercapnia: Secondary | ICD-10-CM | POA: Diagnosis not present

## 2022-10-11 DIAGNOSIS — R791 Abnormal coagulation profile: Secondary | ICD-10-CM | POA: Diagnosis present

## 2022-10-11 DIAGNOSIS — I700 Atherosclerosis of aorta: Secondary | ICD-10-CM | POA: Diagnosis not present

## 2022-10-11 DIAGNOSIS — R54 Age-related physical debility: Secondary | ICD-10-CM | POA: Diagnosis present

## 2022-10-11 DIAGNOSIS — L97512 Non-pressure chronic ulcer of other part of right foot with fat layer exposed: Secondary | ICD-10-CM | POA: Diagnosis not present

## 2022-10-11 DIAGNOSIS — Z79899 Other long term (current) drug therapy: Secondary | ICD-10-CM

## 2022-10-11 DIAGNOSIS — I358 Other nonrheumatic aortic valve disorders: Secondary | ICD-10-CM | POA: Diagnosis present

## 2022-10-11 DIAGNOSIS — I509 Heart failure, unspecified: Secondary | ICD-10-CM | POA: Diagnosis not present

## 2022-10-11 DIAGNOSIS — D62 Acute posthemorrhagic anemia: Secondary | ICD-10-CM | POA: Diagnosis not present

## 2022-10-11 DIAGNOSIS — I739 Peripheral vascular disease, unspecified: Secondary | ICD-10-CM | POA: Diagnosis present

## 2022-10-11 DIAGNOSIS — Z7401 Bed confinement status: Secondary | ICD-10-CM | POA: Diagnosis not present

## 2022-10-11 DIAGNOSIS — R278 Other lack of coordination: Secondary | ICD-10-CM | POA: Diagnosis not present

## 2022-10-11 DIAGNOSIS — I96 Gangrene, not elsewhere classified: Secondary | ICD-10-CM | POA: Diagnosis not present

## 2022-10-11 DIAGNOSIS — J449 Chronic obstructive pulmonary disease, unspecified: Secondary | ICD-10-CM | POA: Diagnosis present

## 2022-10-11 DIAGNOSIS — J441 Chronic obstructive pulmonary disease with (acute) exacerbation: Secondary | ICD-10-CM | POA: Diagnosis present

## 2022-10-11 DIAGNOSIS — I701 Atherosclerosis of renal artery: Secondary | ICD-10-CM | POA: Diagnosis present

## 2022-10-11 DIAGNOSIS — I9589 Other hypotension: Secondary | ICD-10-CM | POA: Diagnosis not present

## 2022-10-11 DIAGNOSIS — I255 Ischemic cardiomyopathy: Secondary | ICD-10-CM | POA: Diagnosis present

## 2022-10-11 DIAGNOSIS — I119 Hypertensive heart disease without heart failure: Secondary | ICD-10-CM | POA: Diagnosis not present

## 2022-10-11 DIAGNOSIS — Z811 Family history of alcohol abuse and dependence: Secondary | ICD-10-CM

## 2022-10-11 DIAGNOSIS — D649 Anemia, unspecified: Secondary | ICD-10-CM | POA: Diagnosis not present

## 2022-10-11 DIAGNOSIS — R579 Shock, unspecified: Secondary | ICD-10-CM | POA: Diagnosis not present

## 2022-10-11 DIAGNOSIS — R918 Other nonspecific abnormal finding of lung field: Secondary | ICD-10-CM | POA: Diagnosis not present

## 2022-10-11 LAB — CBC WITH DIFFERENTIAL/PLATELET
Abs Immature Granulocytes: 0.15 10*3/uL — ABNORMAL HIGH (ref 0.00–0.07)
Basophils Absolute: 0.1 10*3/uL (ref 0.0–0.1)
Basophils Relative: 1 %
Eosinophils Absolute: 0.2 10*3/uL (ref 0.0–0.5)
Eosinophils Relative: 2 %
HCT: 40.7 % (ref 39.0–52.0)
Hemoglobin: 12.4 g/dL — ABNORMAL LOW (ref 13.0–17.0)
Immature Granulocytes: 2 %
Lymphocytes Relative: 11 %
Lymphs Abs: 1.1 10*3/uL (ref 0.7–4.0)
MCH: 26.3 pg (ref 26.0–34.0)
MCHC: 30.5 g/dL (ref 30.0–36.0)
MCV: 86.4 fL (ref 80.0–100.0)
Monocytes Absolute: 0.9 10*3/uL (ref 0.1–1.0)
Monocytes Relative: 9 %
Neutro Abs: 7.8 10*3/uL — ABNORMAL HIGH (ref 1.7–7.7)
Neutrophils Relative %: 75 %
Platelets: 336 10*3/uL (ref 150–400)
RBC: 4.71 MIL/uL (ref 4.22–5.81)
RDW: 18.2 % — ABNORMAL HIGH (ref 11.5–15.5)
WBC: 10.2 10*3/uL (ref 4.0–10.5)
nRBC: 0 % (ref 0.0–0.2)

## 2022-10-11 LAB — BASIC METABOLIC PANEL
Anion gap: 11 (ref 5–15)
BUN: 20 mg/dL (ref 8–23)
CO2: 26 mmol/L (ref 22–32)
Calcium: 8.3 mg/dL — ABNORMAL LOW (ref 8.9–10.3)
Chloride: 100 mmol/L (ref 98–111)
Creatinine, Ser: 1.62 mg/dL — ABNORMAL HIGH (ref 0.61–1.24)
GFR, Estimated: 42 mL/min — ABNORMAL LOW (ref >60)
Glucose, Bld: 89 mg/dL (ref 70–99)
Potassium: 3.5 mmol/L (ref 3.5–5.1)
Sodium: 137 mmol/L (ref 135–145)

## 2022-10-11 LAB — GLUCOSE, CAPILLARY
Glucose-Capillary: 119 mg/dL — ABNORMAL HIGH (ref 70–99)
Glucose-Capillary: 120 mg/dL — ABNORMAL HIGH (ref 70–99)

## 2022-10-11 LAB — I-STAT CHEM 8, ED
BUN: 24 mg/dL — ABNORMAL HIGH (ref 8–23)
Calcium, Ion: 1.07 mmol/L — ABNORMAL LOW (ref 1.15–1.40)
Chloride: 103 mmol/L (ref 98–111)
Creatinine, Ser: 1.6 mg/dL — ABNORMAL HIGH (ref 0.61–1.24)
Glucose, Bld: 85 mg/dL (ref 70–99)
HCT: 40 % (ref 39.0–52.0)
Hemoglobin: 13.6 g/dL (ref 13.0–17.0)
Potassium: 3.7 mmol/L (ref 3.5–5.1)
Sodium: 138 mmol/L (ref 135–145)
TCO2: 26 mmol/L (ref 22–32)

## 2022-10-11 LAB — PROTIME-INR
INR: 2.5 — ABNORMAL HIGH (ref 0.8–1.2)
Prothrombin Time: 27.5 seconds — ABNORMAL HIGH (ref 11.4–15.2)

## 2022-10-11 MED ORDER — ONDANSETRON HCL 4 MG/2ML IJ SOLN
4.0000 mg | Freq: Once | INTRAMUSCULAR | Status: AC
  Administered 2022-10-11: 4 mg via INTRAVENOUS
  Filled 2022-10-11: qty 2

## 2022-10-11 MED ORDER — LEVOTHYROXINE SODIUM 75 MCG PO TABS
150.0000 ug | ORAL_TABLET | Freq: Every day | ORAL | Status: DC
  Administered 2022-10-12 – 2022-10-31 (×17): 150 ug via ORAL
  Filled 2022-10-11 (×19): qty 2

## 2022-10-11 MED ORDER — INSULIN ASPART 100 UNIT/ML IJ SOLN: 0.0000 [IU] | Freq: Three times a day (TID) | INTRAMUSCULAR | Status: DC

## 2022-10-11 MED ORDER — IOHEXOL 350 MG/ML SOLN
80.0000 mL | Freq: Once | INTRAVENOUS | Status: AC | PRN
  Administered 2022-10-11: 80 mL via INTRAVENOUS

## 2022-10-11 MED ORDER — METOPROLOL SUCCINATE ER 25 MG PO TB24
25.0000 mg | ORAL_TABLET | Freq: Two times a day (BID) | ORAL | Status: DC
  Administered 2022-10-12 – 2022-10-18 (×5): 25 mg via ORAL
  Filled 2022-10-11 (×13): qty 1

## 2022-10-11 MED ORDER — FUROSEMIDE 40 MG PO TABS
40.0000 mg | ORAL_TABLET | Freq: Two times a day (BID) | ORAL | Status: DC
  Administered 2022-10-11 – 2022-10-17 (×11): 40 mg via ORAL
  Filled 2022-10-11 (×12): qty 1

## 2022-10-11 MED ORDER — MORPHINE SULFATE (PF) 4 MG/ML IV SOLN
4.0000 mg | Freq: Once | INTRAVENOUS | Status: AC
  Administered 2022-10-11: 4 mg via INTRAVENOUS
  Filled 2022-10-11: qty 1

## 2022-10-11 MED ORDER — AMIODARONE HCL 200 MG PO TABS
200.0000 mg | ORAL_TABLET | Freq: Two times a day (BID) | ORAL | Status: DC
  Administered 2022-10-12 – 2022-10-24 (×22): 200 mg via ORAL
  Filled 2022-10-11 (×27): qty 1

## 2022-10-11 MED ORDER — SODIUM CHLORIDE 0.9 % IV BOLUS
500.0000 mL | Freq: Once | INTRAVENOUS | Status: AC
  Administered 2022-10-11: 500 mL via INTRAVENOUS

## 2022-10-11 MED ORDER — HYDROMORPHONE HCL 1 MG/ML IJ SOLN
0.5000 mg | INTRAMUSCULAR | Status: DC | PRN
  Administered 2022-10-11 – 2022-10-20 (×13): 0.5 mg via INTRAVENOUS
  Filled 2022-10-11 (×13): qty 0.5

## 2022-10-11 MED ORDER — OXYCODONE HCL 5 MG PO TABS
5.0000 mg | ORAL_TABLET | Freq: Four times a day (QID) | ORAL | Status: DC | PRN
  Administered 2022-10-12 – 2022-10-26 (×19): 5 mg via ORAL
  Filled 2022-10-11 (×21): qty 1

## 2022-10-11 MED ORDER — ALBUTEROL SULFATE (2.5 MG/3ML) 0.083% IN NEBU: 2.5000 mg | INHALATION_SOLUTION | Freq: Four times a day (QID) | RESPIRATORY_TRACT | Status: DC | PRN

## 2022-10-11 MED ORDER — ATORVASTATIN CALCIUM 10 MG PO TABS
20.0000 mg | ORAL_TABLET | Freq: Every day | ORAL | Status: DC
  Administered 2022-10-12 – 2022-10-24 (×12): 20 mg via ORAL
  Filled 2022-10-11 (×14): qty 2

## 2022-10-11 NOTE — ED Provider Notes (Signed)
[facility]  SURGICAL Provider Note  CSN: [id number] Arrival date & time: 10/11/22 1056  Chief Complaint(s) Foot Pain  HPI [name] is a 82 y.o. male with past medical history as below, significant for AAA, renal artery gnosis, CAD, CHF, COPD, ischemic cardiomyopathy, subclavian artery artery stenosis, PAD, PAF on warfarin who presents to the ED with complaint of right foot pain.  Patient follows with Dr. Kirke Corin for PAD, scheduled for aortogram later this month. Called cardiology office this AM in regards to worsening foot pain and was sent to ED for eval. PAD to RLE, worsened pain over last week. Difficulty with ambulation and reduced sensation to the foot. Continues on warfarin   Past Medical History Past Medical History:  Diagnosis Date   (HFimpEF) heart failure with improved ejection fraction (HCC)    a. 06/2011 Echo: EF 35-40%; b. 06/2012 Echo: EF 50%; c. 12/2016 Echo: EF 55-60%, no rwma, GRI DD, mild AS/MR, nl RV fxn, nl RVSP; d. 02/2021 Echo: EF 55%, GrII DD. Mild MR. Mild to mod TR. Sev AS by VTI.   AAA (abdominal aortic aneurysm) (HCC)    a. 05/2020 Abd u/s: 3.6cm.   Aortic calcification (HCC) 05/01/2013   Bilateral renal artery stenosis (HCC)    a. 06/2013 Angio/PTA: LRA: 95 (5x18 Herculink stent), RRA 60ost; b. 04/2021 Angio: mod RRA stenosis w/ patent LRA stent.   Carotid arterial disease (HCC)    a. 05/2020 Carotid U/S: RICA 1-39%, LICA 40-59%   CHF (congestive heart failure) (HCC)    Chronic kidney disease    COPD (chronic obstructive pulmonary disease) (HCC)    Coronary artery disease    a. 2013 s/p CABG x 3 (LIMA->LAD, VG->OM, VG->RPDA; b. 06/2012 MV: no ischemia; c. 02/2021 Cath: LM 100, RCA 90ost, 118m, free LIMA->LAD nl, VG->OM2 nl, VG->RPDA nl. Mod AS (mean grad , AVA 1.1cm^2). RHC w/ elev filling pressures.   History of prior cigarette smoking 04/11/2008   Qualifier: Diagnosis of  By: Ermalene Searing MD, Amy     Hyperlipidemia    Hypertension     Hypothyroidism    Intraventricular hemorrhage (HCC) 07/12/2012   Ischemic cardiomyopathy    a. 06/2011 Echo: EF 35-40%; b. 06/2012 Echo: EF 50%; c. 12/2016 Echo: EF 55-60%, no rwma, GRI DD; d. 02/2021 Echo: Ef 55%, GrII DD.   Moderate aortic stenosis    a. 02/2021 Echo: EF 55%, GrII DD. Sev Ca2+ of AoV. Sev AS w/ AVA by VTI 0.79cm^2; b. 02/2021 Cath: Mod AS w/ mean grad . AVA 1.1cm^2.   PAF (paroxysmal atrial fibrillation) (HCC)    Peripheral arterial disease (HCC)    a. Previous left lower extremity stenting by Dr. Evette Cristal;  b. 12/2012 s/p bilat ostial common iliac stenting; c. 04/2021 Angio: Patent LRA stent, mod RRA stenosis. Small AAA. Sev plaque RSFA 2/ subtl occl of inf branch of profunda. Diff Ca2+ SFA dzs w/ collats from profunda and 3V runoff. Diffuse LSFA dzs w/ collats from profunda. Subtl PT dzs. 3V runoff-->Med Rx.   Right leg DVT (HCC) 08/13/2012   Subclavian artery stenosis, left (HCC) 05/2012   Status post stenting of the ostium and self-expanding stent placement to the left axillary artery   Venous insufficiency    Patient Active Problem List   Diagnosis Date Noted   Critical limb ischemia of right lower extremity with ulceration of foot (HCC) 10/11/2022   Limb ischemia 10/11/2022   Heart failure with preserved ejection fraction (HCC) 03/23/2022   History  of deep vein thrombosis (DVT) of lower extremity 03/23/2022   Pneumothorax 02/06/2022   Venous stasis ulcer of left ankle limited to breakdown of skin with varicose veins (HCC) 09/22/2021   Aortic valve stenosis    Long term (current) use of anticoagulants 01/27/2017   DVT (deep venous thrombosis) (HCC) 12/29/2016   PAF (paroxysmal atrial fibrillation) (HCC) 05/24/2013   Aortic calcification (HCC) 05/01/2013   CKD (chronic kidney disease) stage 3, GFR 30-59 ml/min (HCC) 05/01/2013   Bilateral renal artery stenosis (HCC)    HTN (hypertension) 07/15/2012   Intraventricular hemorrhage (HCC) 07/12/2012   Peripheral  arterial disease (HCC)    Ischemic cardiomyopathy 07/08/2011   CAD (coronary artery disease), native coronary artery 07/07/2011   Subclavian arterial stenosis (HCC) 04/25/2011   BPH (benign prostatic hyperplasia) 12/29/2010   COPD (chronic obstructive pulmonary disease) (HCC) 01/08/2010   Abdominal aortic aneurysm (HCC) 11/06/2009   HYPERCHOLESTEROLEMIA 10/09/2009   History of prior cigarette smoking 04/11/2008   Hypothyroidism 09/07/2006   Home Medication(s) Prior to Admission medications   Medication Sig Start Date End Date Taking? Authorizing Provider  albuterol (VENTOLIN HFA) 108 (90 Base) MCG/ACT inhaler Inhale 2 puffs into the lungs every 6 (six) hours as needed for wheezing or shortness of breath. 03/25/22  Yes Sunnie Nielsen, DO  amiodarone (PACERONE) 200 MG tablet TAKE 1 TABLET BY MOUTH TWICE A DAY 08/29/22  Yes Bensimhon, Bevelyn Buckles, MD  amoxicillin-clavulanate (AUGMENTIN) 875-125 MG tablet Take 1 tablet by mouth 2 (two) times daily. 09/30/22 10/15/22 Yes [provider]  atorvastatin (LIPITOR) 20 MG tablet TAKE 1 TABLET BY MOUTH EVERY DAY 12/24/21  Yes [name], MD  augmented betamethasone dipropionate (DIPROLENE-AF) 0.05 % cream Apply 1 application  topically 2 (two) times daily as needed (breakouts). 03/14/22  Yes [provider]  empagliflozin (JARDIANCE) 10 MG TABS tablet Take 1 tablet (10 mg total) by mouth daily before breakfast. 06/20/22  Yes Sabharwal, Aditya, DO  furosemide (LASIX) 40 MG tablet Take 1 tablet (40 mg total) by mouth 2 (two) times daily. 08/15/22  Yes Copland, Karleen Hampshire, MD  levothyroxine (SYNTHROID) 150 MCG tablet TAKE ONE TABLET BY MOUTH EVERY DAY BEFORE BREAKFAST Patient taking differently: Take 150 mcg by mouth daily before breakfast. TAKE ONE TABLET BY MOUTH EVERY DAY BEFORE BREAKFAST 07/08/22  Yes Copland, Karleen Hampshire, MD  metoprolol succinate (TOPROL XL) 25 MG 24 hr tablet Take 1 tablet (25 mg total) by mouth 2 (two) times daily. 05/16/22   Yes Bensimhon, Bevelyn Buckles, MD  warfarin (COUMADIN) 5 MG tablet Take 2.5 mg by mouth See admin instructions. Take 2.5 mg by mouth once daily except Saturday.   Yes [provider]                                                                                                                                    Past Surgical History Past Surgical History:  Procedure Laterality Date  ABDOMINAL ANGIOGRAM  07/10/2013   WITH BI-FEMORAL RUNOFF       DR Kirke Corin   ABDOMINAL AORTAGRAM N/A 12/26/2012   Procedure: ABDOMINAL Ronny Flurry;  Surgeon: [name], MD;  Location: [facility];  Service: Cardiovascular;  Laterality: N/A;   ABDOMINAL AORTAGRAM N/A 07/10/2013   Procedure: ABDOMINAL Ronny Flurry;  Surgeon: [name], MD;  Location: [facility];  Service: Cardiovascular;  Laterality: N/A;   ABDOMINAL AORTOGRAM W/LOWER EXTREMITY N/A 05/12/2021   Procedure: ABDOMINAL AORTOGRAM W/LOWER EXTREMITY;  Surgeon: [name], MD;  Location: [facility];  Service: Cardiovascular;  Laterality: N/A;   ANGIOPLASTY / STENTING FEMORAL  05/2012   ANGIOPLASTY / STENTING ILIAC Bilateral 12/26/2012   ARCH AORTOGRAM N/A 06/20/2012   Procedure: ARCH AORTOGRAM;  Surgeon: [name], MD;  Location: [facility];  Service: Cardiovascular;  Laterality: N/A;   CARDIAC CATHETERIZATION  05/2012   [facility]   CARDIOVERSION N/A 05/23/2022   Procedure: CARDIOVERSION;  Surgeon: Dolores Patty, MD;  Location: [facility] OR[facility];  Service: Cardiovascular;  Laterality: N/A;   CARDIOVERSION N/A 06/14/2022   Procedure: CARDIOVERSION;  Surgeon: Dolores Patty, MD;  Location: [facility] OR[facility];  Service: Cardiovascular;  Laterality: N/A;   CHOLECYSTECTOMY  1990's   CORONARY ARTERY BYPASS GRAFT  07/11/2011   Procedure: CORONARY ARTERY BYPASS GRAFTING (CABG);  Surgeon: [name], MD;  Location: [facility] OR;  Service: Open Heart Surgery;  Laterality: N/A;  Times 3. On Pump. Using right greater saphenous vein and left internal  mammary artery.    CORONARY ARTERY BYPASS GRAFT     HERNIA REPAIR     INSERTION OF ILIAC STENT Bilateral 12/26/2012   Procedure: INSERTION OF ILIAC STENT;  Surgeon: [name], MD;  Location: [facility];  Service: Cardiovascular;  Laterality: Bilateral;  Bilateral Common Iliac Artery   LEFT AND RIGHT HEART CATHETERIZATION WITH CORONARY ANGIOGRAM  07/07/2011   Procedure: LEFT AND RIGHT HEART CATHETERIZATION WITH CORONARY ANGIOGRAM;  Surgeon: [name], MD;  Location: [facility];  Service: Cardiovascular;;   LOWER EXTREMITY VENOGRAPHY Bilateral 12/30/2016   Procedure: Lower Extremity Venography;  Surgeon: Renford Dills, MD;  Location: [facility] CV [facility];  Service: Cardiovascular;  Laterality: Bilateral;   PERCUTANEOUS STENT INTERVENTION  06/20/2012   Procedure: PERCUTANEOUS STENT INTERVENTION;  Surgeon: [name], MD;  Location: [facility];  Service: Cardiovascular;;   RENAL ARTERY ANGIOPLASTY Left 07/10/2013   DR ARIDA   RIGHT/LEFT HEART CATH AND CORONARY/GRAFT ANGIOGRAPHY N/A 03/15/2021   Procedure: RIGHT/LEFT HEART CATH AND CORONARY/GRAFT ANGIOGRAPHY;  Surgeon: [name], MD;  Location: [facility];  Service: Cardiovascular;  Laterality: N/A;   SUBCLAVIAN ARTERY STENT  05/2012   "2 stents" (12/26/2012)   TEE WITHOUT CARDIOVERSION N/A 05/23/2022   Procedure: TRANSESOPHAGEAL ECHOCARDIOGRAM (TEE);  Surgeon: Dolores Patty, MD;  Location: [facility] OR[facility];  Service: Cardiovascular;  Laterality: N/A;   TOOTH EXTRACTION  Spring 2017   mulitple (6)   VENA CAVA FILTER PLACEMENT  07/2012   Removable   Family History Family History  Problem Relation Age of Onset   Alcohol abuse Father    Cirrhosis Father    Hypothyroidism Sister     Social History Social History   Tobacco Use   Smoking status: Former    Packs/day: 1.00    Years: 60.00    Additional pack years: 0.00    Total pack years: 60.00    Types: Cigarettes    Quit date:  12/25/2010     Years since quitting: 11.8   Smokeless tobacco: Never  Vaping Use   Vaping Use: Never used  Substance Use Topics   Alcohol use: No   Drug use: No   Allergies Plavix [clopidogrel bisulfate]  Review of Systems Review of Systems  Constitutional:  Negative for chills and fever.  HENT:  Negative for facial swelling and trouble swallowing.   Eyes:  Negative for photophobia and visual disturbance.  Respiratory:  Negative for cough and shortness of breath.   Cardiovascular:  Negative for chest pain and palpitations.  Gastrointestinal:  Negative for abdominal pain, nausea and vomiting.  Endocrine: Negative for polydipsia and polyuria.  Genitourinary:  Negative for difficulty urinating and hematuria.  Musculoskeletal:  Positive for arthralgias and gait problem. Negative for joint swelling.  Skin:  Negative for pallor and rash.  Neurological:  Negative for syncope and headaches.  Psychiatric/Behavioral:  Negative for agitation and confusion.     Physical Exam Vital Signs  I have reviewed the triage vital signs BP (!) 114/45 (BP Location: Right Arm)   Pulse (!) 57   Temp 97.7 F (36.5 C) (Oral)   Resp 18   Ht 5\' 9"  (1.753 m)   Wt 68 kg   SpO2 94%   BMI 22.14 kg/m  Physical Exam Vitals and nursing note reviewed.  Constitutional:      General: He is not in acute distress.    Appearance: Normal appearance. He is well-developed. He is not ill-appearing.  HENT:     Head: Normocephalic and atraumatic.     Right Ear: External ear normal.     Left Ear: External ear normal.     Mouth/Throat:     Mouth: Mucous membranes are moist.  Eyes:     General: No scleral icterus. Cardiovascular:     Rate and Rhythm: Normal rate and regular rhythm.     Pulses: Normal pulses.     Heart sounds: Normal heart sounds.  Pulmonary:     Effort: Pulmonary effort is normal. No respiratory distress.     Breath sounds: Normal breath sounds.  Abdominal:     General: Abdomen is flat.     Palpations:  Abdomen is soft.     Tenderness: There is no abdominal tenderness.  Musculoskeletal:        General: Normal range of motion.     Cervical back: No rigidity.     Right lower leg: No edema.     Left lower leg: No edema.  Skin:    General: Skin is warm and dry.     Capillary Refill: Capillary refill takes less than 2 seconds.     Comments: Ischemic appearing right foot, hallux primarily Dopplerable DP/PT pulses on right  Cap refill 3-5 seconds   Neurological:     Mental Status: He is alert and oriented to person, place, and time.     GCS: GCS eye subscore is 4. GCS verbal subscore is 5. GCS motor subscore is 6.  Psychiatric:        Mood and Affect: Mood normal.        Behavior: Behavior normal.     ED Results and Treatments Labs (all labs ordered are listed, but only abnormal results are displayed) Labs Reviewed  CBC WITH DIFFERENTIAL/PLATELET - Abnormal; Notable for the following components:      Result Value   Hemoglobin 12.4 (*)    RDW 18.2 (*)    Neutro Abs 7.8 (*)    Abs  Immature Granulocytes 0.15 (*)    All other components within normal limits  BASIC METABOLIC PANEL - Abnormal; Notable for the following components:   Creatinine, Ser 1.62 (*)    Calcium 8.3 (*)    GFR, Estimated 42 (*)    All other components within normal limits  PROTIME-INR - Abnormal; Notable for the following components:   Prothrombin Time 27.5 (*)    INR 2.5 (*)    All other components within normal limits  I-STAT CHEM 8, ED - Abnormal; Notable for the following components:   BUN 24 (*)    Creatinine, Ser 1.60 (*)    Calcium, Ion 1.07 (*)    All other components within normal limits                                                                                                                          Radiology CT Angio Aortobifemoral W and/or Wo Contrast  Result Date: 10/11/2022 CLINICAL DATA:  Right lower extremity ischemia, delayed wound healing. Decreased contrast dose administered due  to renal function. Repeat imaging EXAM: CT ANGIOGRAPHY OF ABDOMINAL AORTA WITH ILIOFEMORAL RUNOFF TECHNIQUE: Multidetector CT imaging of the abdomen, pelvis and lower extremities was performed using the standard protocol during bolus administration of intravenous contrast. Multiplanar CT image reconstructions and MIPs were obtained to evaluate the vascular anatomy. RADIATION DOSE REDUCTION: This exam was performed according to the departmental dose-optimization program which includes automated exposure control, adjustment of the mA and/or kV according to patient size and/or use of iterative reconstruction technique. CONTRAST:  80mL OMNIPAQUE IOHEXOL 350 MG/ML SOLN COMPARISON:  Prior CT scan of the abdomen and pelvis 02/06/2022 FINDINGS: VASCULAR Aorta: Irregular bilobed infrarenal abdominal aortic aneurysm measures up to 5.5 x 5.2 cm on the coronal and sagittal reformatted images. There is extensive wall adherent mural thrombus. Celiac: Calcified plaque at the origin of the celiac artery results in mild to moderate focal stenosis. SMA: Calcified plaque in the proximal SMA results in at least mild stenosis. Renals: Solitary renal arteries bilaterally. A renal stent is present in the left renal artery, however the artery appears occluded beyond the terminus of the stent. Calcified plaque results in at least moderate stenosis of the right renal artery. IMA: Diminutive but patent. RIGHT Lower Extremity Inflow: Patent kissing iliac stents. Significant stenosis at the origin of the internal iliac artery. The external iliac artery is heavily calcified but remains patent. Outflow: Bulky calcified atherosclerotic plaque along the posterior wall of the common femoral artery. At least moderate stenosis at the origins of the duplicated profunda femoral artery. The superficial femoral artery is heavily diseased with critical stenosis proximally. Multifocal moderate stenosis present throughout the mid thigh. The vessel likely  occludes completely for short segment in Hunter's canal. The popliteal artery is small in caliber and heavily diseased with bulky calcified atherosclerotic plaque. Runoff: Patent 3 vessel runoff to the ankle. LEFT Lower Extremity Inflow: Widely patent kissing iliac stents. Scattered plaque along the external  iliac artery without significant stenosis. Outflow: Bulky plaque along the posterior wall of the common femoral artery without significant stenosis or occlusion. The profunda femoral branches are patent. Critical stenosis bordering on occlusion of the proximal superficial femoral artery. The remainder of the SFA is heavily diseased with bulky calcifications throughout. The artery reconstitutes as it exits Hunter's canal. The popliteal artery is heavily disease with multifocal bulky calcifications in fibrofatty atherosclerotic plaque. Runoff: Patent 3 vessel runoff to the ankle. Veins: Enlarged right superficial great saphenous vein. Extensive tortuous venous collaterals on the left as well. Findings suggest bilateral superficial venous insufficiency. Review of the MIP images confirms the above findings. NON-VASCULAR Lower chest: Cardiomegaly with extensive atherosclerotic calcifications along the coronary arteries. Diffuse bronchial wall thickening. Centrilobular pulmonary emphysema. Hepatobiliary: No focal liver abnormality is seen. Status post cholecystectomy. No biliary dilatation. Pancreas: Unremarkable. No pancreatic ductal dilatation or surrounding inflammatory changes. Spleen: No splenic injury or perisplenic hematoma. Adrenals/Urinary Tract: Normal adrenal glands. Renal size discrepancy. The left kidney is small relative to the right likely due to significant stenosis of the left renal artery beyond the previously placed renal artery stent. Multiple circumscribed water attenuation simple cysts along the right kidney. No imaging follow-up is recommended. The ureters are unremarkable. The bladder is  distended. Stomach/Bowel: No focal bowel wall thickening or evidence of obstruction. Lymphatic: No suspicious lymphadenopathy. Reproductive: Mild prostatomegaly. Other: No abdominal wall hernia or abnormality. No abdominopelvic ascites. Musculoskeletal: Straightening of the normal lumbar lordosis. No acute fracture or malalignment. Multilevel degenerative changes. Mild bilateral hip joint degenerative osteoarthritis. IMPRESSION: VASCULAR 1. Irregular bilobed infrarenal abdominal aortic aneurysm measuring up to 5.5 cm. Recommend referral to a vascular specialist. This recommendation follows ACR consensus guidelines: White Paper of the ACR Incidental Findings Committee II on Vascular Findings. J Am Coll Radiol 2013; 10:789-794. 2. Severe bilateral femoropopliteal peripheral arterial disease as described above. 3. Large tortuous venous varicosities and great saphenous veins suggests chronic venous insufficiency as well. This can also be a source for chronic nonhealing lower extremity wounds. 4. Widely patent kissing iliac stents. 5. Chronic occlusion of left renal artery stent. 6. Celiac and SMA artery origins stenoses. NON-VASCULAR 1. Cardiomegaly with coronary artery disease. 2. Centrilobular pulmonary emphysema. 3. Distended bladder suggests the possibility of bladder outlet obstruction from BPH. 4. Multilevel degenerative disc disease with straightening of the normal lumbar lordosis. 5. Bilateral hip joint degenerative osteoarthritis. Aortic aneurysm NOS (ICD10-I71.9); Aortic Atherosclerosis (ICD10-I70.0) and Emphysema (ICD10-J43.9). Electronically Signed   By: Malachy Moan M.D.   On: 10/11/2022 13:58    Pertinent labs & imaging results that were available during my care of the patient were reviewed by me and considered in my medical decision making (see MDM for details).  Medications Ordered in ED Medications  amiodarone (PACERONE) tablet 200 mg (200 mg Oral Not Given 10/11/22 1511)  atorvastatin  (LIPITOR) tablet 20 mg (20 mg Oral Not Given 10/11/22 1512)  furosemide (LASIX) tablet 40 mg (has no administration in time range)  metoprolol succinate (TOPROL-XL) 24 hr tablet 25 mg (25 mg Oral Not Given 10/11/22 1512)  levothyroxine (SYNTHROID) tablet 150 mcg (has no administration in time range)  albuterol (PROVENTIL) (2.5 MG/3ML) 0.083% nebulizer solution 2.5 mg (has no administration in time range)  insulin aspart (novoLOG) injection 0-9 Units (has no administration in time range)  oxyCODONE (Oxy IR/ROXICODONE) immediate release tablet 5 mg (has no administration in time range)  HYDROmorphone (DILAUDID) injection 0.5 mg (has no administration in time range)  morphine (PF) 4 MG/ML  injection 4 mg (4 mg Intravenous Given 10/11/22 1214)  ondansetron (ZOFRAN) injection 4 mg (4 mg Intravenous Given 10/11/22 1214)  sodium chloride 0.9 % bolus 500 mL (500 mLs Intravenous New Bag/Given 10/11/22 1214)  iohexol (OMNIPAQUE) 350 MG/ML injection 80 mL (80 mLs Intravenous Contrast Given 10/11/22 1314)                                                                                                                                     Procedures .Critical Care  Performed by: Sloan Leiter, DO Authorized by: Sloan Leiter, DO   Critical care provider statement:    Critical care time (minutes):  47   Critical care time was exclusive of:  Separately billable procedures and treating other patients   Critical care was necessary to treat or prevent imminent or life-threatening deterioration of the following conditions:  Circulatory failure   Critical care was time spent personally by me on the following activities:  Development of treatment plan with patient or surrogate, discussions with consultants, evaluation of patient's response to treatment, examination of patient, ordering and review of laboratory studies, ordering and review of radiographic studies, ordering and performing treatments and interventions, pulse  oximetry, re-evaluation of patient's condition, review of old charts and obtaining history from patient or surrogate   Care discussed with: admitting provider     (including critical care time)  Medical Decision Making / ED Course    Medical Decision Making:    [name] is a 82 y.o. male with past medical history as below, significant for AAA, renal artery gnosis, CAD, CHF, COPD, ischemic cardiomyopathy, subclavian artery artery stenosis, PAD, PAF on warfarin who presents to the ED with complaint of right foot pain. . The complaint involves an extensive differential diagnosis and also carries with it a high risk of complications and morbidity.  Serious etiology was considered. Ddx includes but is not limited to: claudication, pad, ischemic limb, less likely cellulitis vs dvt, etc  Complete initial physical exam performed, notably the patient  was mild discomfort, HDS .    Reviewed and confirmed nursing documentation for past medical history, family history, social history.  Vital signs reviewed.    Clinical Course as of 10/11/22 1652  Tue Oct 11, 2022  1128 Dr Kirke Corin managing PAD [SG]  1206 Spoke with Dr [name], recommend d/w cardiology as he follows w/ Dr Kirke Corin. Spoke with cardiology, Dr Kirke Corin is out of town, they will touch base with vascular to see if they can see him in consult.  [SG]  1406 Spoke with Dr. [name] at bedside, patient require arteriogram but given his INR is elevated at 2.5 and is on warfarin will need to be admitted to medicine until this can be stabilized to where it is safe to perform arteriogram. [SG]    Clinical Course User Index [SG] Sloan Leiter, DO    Patient with severe PAD, concern  for limb ischemia.  Vascular surgery recommending admission, aortogram.  Will admit to hospitalist to facilitate vascular intervention.         Additional history obtained: -Additional history obtained from spouse -External records from outside source obtained and  reviewed including: Chart review including previous notes, labs, imaging, consultation notes including primary care documentation, prior labs/imaging/home meds    Lab Tests: -I ordered, reviewed, and interpreted labs.   The pertinent results include:   Labs Reviewed  CBC WITH DIFFERENTIAL/PLATELET - Abnormal; Notable for the following components:      Result Value   Hemoglobin 12.4 (*)    RDW 18.2 (*)    Neutro Abs 7.8 (*)    Abs Immature Granulocytes 0.15 (*)    All other components within normal limits  BASIC METABOLIC PANEL - Abnormal; Notable for the following components:   Creatinine, Ser 1.62 (*)    Calcium 8.3 (*)    GFR, Estimated 42 (*)    All other components within normal limits  PROTIME-INR - Abnormal; Notable for the following components:   Prothrombin Time 27.5 (*)    INR 2.5 (*)    All other components within normal limits  I-STAT CHEM 8, ED - Abnormal; Notable for the following components:   BUN 24 (*)    Creatinine, Ser 1.60 (*)    Calcium, Ion 1.07 (*)    All other components within normal limits    Notable for INR 2.5  EKG   EKG Interpretation  Date/Time:    Ventricular Rate:    PR Interval:    QRS Duration:   QT Interval:    QTC Calculation:   R Axis:     Text Interpretation:           Imaging Studies ordered: I ordered imaging studies including ct runoff I independently visualized the following imaging with scope of interpretation limited to determining acute life threatening conditions related to emergency care; findings noted above, significant for severe pad I independently visualized and interpreted imaging. I agree with the radiologist interpretation   Medicines ordered and prescription drug management: Meds ordered this encounter  Medications   morphine (PF) 4 MG/ML injection 4 mg   ondansetron (ZOFRAN) injection 4 mg   sodium chloride 0.9 % bolus 500 mL   iohexol (OMNIPAQUE) 350 MG/ML injection 80 mL   amiodarone (PACERONE)  tablet 200 mg   atorvastatin (LIPITOR) tablet 20 mg   furosemide (LASIX) tablet 40 mg   metoprolol succinate (TOPROL-XL) 24 hr tablet 25 mg   levothyroxine (SYNTHROID) tablet 150 mcg    TAKE ONE TABLET BY MOUTH EVERY DAY BEFORE BREAKFAST     albuterol (PROVENTIL) (2.5 MG/3ML) 0.083% nebulizer solution 2.5 mg   insulin aspart (novoLOG) injection 0-9 Units    Order Specific Question:   Correction coverage:    Answer:   Sensitive (thin, NPO, renal)    Order Specific Question:   CBG < 70:    Answer:   Implement Hypoglycemia Standing Orders and refer to Hypoglycemia Standing Orders sidebar report    Order Specific Question:   CBG 70 - 120:    Answer:   0 units    Order Specific Question:   CBG 121 - 150:    Answer:   1 unit    Order Specific Question:   CBG 151 - 200:    Answer:   2 units    Order Specific Question:   CBG 201 - 250:    Answer:   3  units    Order Specific Question:   CBG 251 - 300:    Answer:   5 units    Order Specific Question:   CBG 301 - 350:    Answer:   7 units    Order Specific Question:   CBG 351 - 400    Answer:   9 units    Order Specific Question:   CBG > 400    Answer:   call MD and obtain STAT lab verification   oxyCODONE (Oxy IR/ROXICODONE) immediate release tablet 5 mg   HYDROmorphone (DILAUDID) injection 0.5 mg    -I have reviewed the patients home medicines and have made adjustments as needed   Consultations Obtained: I requested consultation with the Dr [name],  and discussed lab and imaging findings as well as pertinent plan - they recommend: admit medicine   Cardiac Monitoring: The patient was maintained on a cardiac monitor.  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 06/2011 Echo: EF 35-40%; b. 06/2012 Echo: EF 50%; c. 12/2016 Echo: EF 55-60%, no rwma, GRI DD, mild AS/MR, nl RV fxn, nl RVSP; d. 02/2021 Echo: EF 55%, GrII DD. Mild MR. Mild to mod TR. Sev AS by VTI.   AAA (abdominal aortic aneurysm) (HCC)    a. 05/2020 Abd u/s: 3.6cm.   Aortic calcification (HCC) 05/01/2013   Bilateral renal artery stenosis (HCC)    a. 06/2013 Angio/PTA: LRA: 95 (5x18 Herculink stent), RRA 60ost; b. 04/2021 Angio: mod RRA stenosis w/ patent LRA stent.   Carotid arterial disease (HCC)    a. 05/2020 Carotid U/S: RICA 1-39%, LICA 40-59%   CHF (congestive heart failure) (HCC)    Chronic kidney disease    COPD (chronic obstructive pulmonary disease) (HCC)    Coronary artery disease    a. 2013 s/p CABG x 3 (LIMA->LAD, VG->OM, VG->RPDA; b. 06/2012 MV: no ischemia; c. 02/2021 Cath: LM 100, RCA 90ost, 15m, free LIMA->LAD nl, VG->OM2 nl, VG->RPDA nl. Mod AS (mean grad , AVA 1.1cm^2). RHC w/ elev filling pressures.   History of prior cigarette smoking 04/11/2008   Qualifier: Diagnosis of  By: Ermalene Searing MD, Amy     Hyperlipidemia    Hypertension    Hypothyroidism    Intraventricular hemorrhage (HCC) 07/12/2012   Ischemic cardiomyopathy    a. 06/2011 Echo: EF 35-40%; b. 06/2012 Echo: EF 50%; c. 12/2016 Echo: EF 55-60%, no rwma, GRI DD; d. 02/2021 Echo: Ef 55%, GrII DD.   Moderate aortic stenosis    a. 02/2021 Echo: EF 55%, GrII DD. Sev Ca2+ of AoV. Sev AS w/ AVA by VTI 0.79cm^2; b. 02/2021 Cath: Mod AS w/ mean grad . AVA 1.1cm^2.   PAF (paroxysmal atrial fibrillation) (HCC)    Peripheral arterial disease (HCC)    a. Previous left lower extremity stenting by Dr. Evette Cristal;  b. 12/2012 s/p bilat ostial common iliac stenting; c. 04/2021 Angio: Patent LRA stent, mod RRA stenosis. Small AAA. Sev plaque RSFA  2/ subtl occl of inf branch of profunda. Diff Ca2+ SFA dzs w/ collats from profunda and 3V runoff. Diffuse  LSFA dzs w/ collats from profunda. Subtl PT dzs. 3V runoff-->Med Rx.   Right leg DVT (HCC) 08/13/2012   Subclavian artery stenosis, left (HCC) 05/2012   Status post stenting of the ostium and self-expanding stent placement to the left axillary artery   Venous insufficiency       Dispostion: Disposition decision including need for hospitalization was considered, and patient admitted to the hospital.    Final Clinical Impression(s) / ED Diagnoses Final diagnoses:  Claudication in peripheral vascular disease (HCC)  Limb ischemia     This chart was dictated using voice recognition software.  Despite best efforts to proofread,  errors can occur which can change the documentation meaning.    Tanda Rockers A, DO 10/11/22 1652

## 2022-10-11 NOTE — Progress Notes (Signed)
ANTICOAGULATION CONSULT NOTE - Initial Consult  Pharmacy Consult for heparin Indication: limb ischemia   Allergies  Allergen Reactions   Plavix [Clopidogrel Bisulfate]     Brain hemorrhage prev while on plavix and aspirin    Patient Measurements: Height: 5\' 9"  (175.3 cm) Weight: 68 kg (149 lb 14.6 oz) IBW/kg (Calculated) : 70.7 Heparin Dosing Weight: 68 kg  Vital Signs: Temp: 97.8 F (36.6 C) (06/18 1111) Temp Source: Oral (06/18 1111) BP: 129/60 (06/18 1245) Pulse Rate: 58 (06/18 1245)  Labs: Recent Labs    10/11/22 1056 10/11/22 1205  HGB 12.4* 13.6  HCT 40.7 40.0  PLT 336  --   LABPROT 27.5*  --   INR 2.5*  --   CREATININE 1.62* 1.60*    Estimated Creatinine Clearance: 34.8 mL/min (A) (by C-G formula based on SCr of 1.6 mg/dL (H)).   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 06/2011 Echo: EF 35-40%; b. 06/2012 Echo: EF 50%; c. 12/2016 Echo: EF 55-60%, no rwma, GRI DD, mild AS/MR, nl RV fxn, nl RVSP; d. 02/2021 Echo: EF 55%, GrII DD. Mild MR. Mild to mod TR. Sev AS by VTI.   AAA (abdominal aortic aneurysm) (HCC)    a. 05/2020 Abd u/s: 3.6cm.   Aortic calcification (HCC) 05/01/2013   Bilateral renal artery stenosis (HCC)    a. 06/2013 Angio/PTA: LRA: 95 (5x18 Herculink stent), RRA 60ost; b. 04/2021 Angio: mod RRA stenosis w/ patent LRA stent.   Carotid arterial disease (HCC)    a. 05/2020 Carotid U/S: RICA 1-39%, LICA 40-59%   CHF (congestive heart failure) (HCC)    Chronic kidney disease    COPD (chronic obstructive pulmonary disease) (HCC)    Coronary artery disease    a. 2013 s/p CABG x 3 (LIMA->LAD, VG->OM, VG->RPDA; b. 06/2012 MV: no ischemia; c. 02/2021 Cath: LM 100, RCA 90ost, 125m, free LIMA->LAD nl, VG->OM2 nl, VG->RPDA nl. Mod AS (mean grad , AVA 1.1cm^2). RHC w/ elev filling pressures.   History of prior cigarette smoking 04/11/2008   Qualifier: Diagnosis of  By:  Ermalene Searing MD, Amy     Hyperlipidemia    Hypertension    Hypothyroidism    Intraventricular hemorrhage (HCC) 07/12/2012   Ischemic cardiomyopathy    a. 06/2011 Echo: EF 35-40%; b. 06/2012 Echo: EF 50%; c. 12/2016 Echo: EF 55-60%, no rwma, GRI DD; d. 02/2021 Echo: Ef 55%, GrII DD.   Moderate aortic stenosis    a. 02/2021 Echo: EF 55%, GrII DD. Sev Ca2+ of AoV. Sev AS w/ AVA by VTI 0.79cm^2; b. 02/2021 Cath: Mod AS w/ mean grad . AVA 1.1cm^2.   PAF (paroxysmal atrial fibrillation) (HCC)    Peripheral arterial disease (HCC)    a. Previous left lower extremity stenting by Dr. Evette Cristal;  b. 12/2012 s/p bilat ostial common iliac stenting; c. 04/2021 Angio: Patent LRA stent, mod RRA stenosis. Small AAA. Sev plaque RSFA 2/ subtl occl of inf branch of profunda. Diff Ca2+ SFA dzs w/ collats from profunda and 3V runoff. Diffuse LSFA dzs w/ collats from profunda. Subtl PT dzs. 3V runoff-->Med Rx.   Right leg DVT (HCC) 08/13/2012   Subclavian artery stenosis, left (HCC) 05/2012   Status post stenting of the ostium and self-expanding stent placement to the left axillary artery   Venous insufficiency     Medications:  (Not in a hospital admission)  Scheduled:   amiodarone  200 mg Oral BID  atorvastatin  20 mg Oral Daily   furosemide  40 mg Oral BID   [START ON 10/12/2022] levothyroxine  150 mcg Oral Q0600   metoprolol succinate  25 mg Oral BID   Assessment: 15 yoM who presented with non healing foot wound on the right foot. Pharmacy has been conuslted to dose heparin for limb ischemia. Patient has previously been on warfarin taking 2.5 mg all days and holding warfarin on Saturday. INR goal is 2-3 with latest INR at 2.5. Hgb 12.8 and plts WNL.   Given INR still above goal will continue to monitor until INR<2 prior to starting warfarin  Goal of Therapy:  Heparin level 0.3-0.7 units/ml Monitor platelets by anticoagulation protocol: Yes   Plan:  Hold initiating heparin until INR <2 Monitor INR  daily Monitor for signs/symptoms of bleeding Follow up plans for vascular surgery  Thanks,  Arabella Merles, PharmD. [facility] Acute Care PGY-1  10/11/2022 5:51 PM

## 2022-10-11 NOTE — ED Triage Notes (Signed)
Pt to the ed from dr office with a CC of foot pain. Pt has wound to 3 toe that he has been following with vascular with. Pt is scheduled to get a vascular surgery  on the 28th. Pt was in podiatry off this morning and ws directed to come straight to ED for care. Pt denies fever, chills, nausea, vomiting.

## 2022-10-11 NOTE — H&P (Signed)
History and Physical    [name] OZH:[id number] DOB: 1940-10-22 DOA: 10/11/2022  PCP: [name], MD (Confirm with patient/family/NH records and if not entered, this has to be entered at [facility] point of entry) Patient coming from: Home  I have personally briefly reviewed patient's old medical records in [facility]  Chief Complaint: Right foot pain  HPI: AXLE CHILCOTT is a 82 y.o. male with medical history significant of PVD status post left subclavian artery and left axillary artery stenting,, CAD status post CABG, CKD stage IIIb, severe AO, PAF and history of DVT on Coumadin and IVC filter, chronic HFrEF presented with worsening of right foot ulcer pain and discoloration.  Symptoms started about 2 weeks ago, patient started noticed right foot and shin pain discoloration and an ulcer soon developed on the right third and fourth toe, went to see cardiology started patient on Augmentin.  Patient was referred to see vascular surgeon Dr. Kary Kos who scheduled patient for arteriogram on June 28.  After completion of p.o. antibiotics last week however his symptoms got worse, now has 10/10 resting pain of right foot and enlargement of the ulcer on right foot, excruciating pain when stand up and put weight on the right foot denies any fever or chills no chest pains.  ED Course: None tachycardia nonhypotensive afebrile.  CTA showed AAA to 5.5 cm and severe bilateral femoral popliteal PVD.  Vascular surgery consulted and plan for emergency arteriogram  Review of Systems: As per HPI otherwise 14 point review of systems negative.    Past Medical History:  Diagnosis Date   (HFimpEF) heart failure with improved ejection fraction (HCC)    a. 06/2011 Echo: EF 35-40%; b. 06/2012 Echo: EF 50%; c. 12/2016 Echo: EF 55-60%, no rwma, GRI DD, mild AS/MR, nl RV fxn, nl RVSP; d. 02/2021 Echo: EF 55%, GrII DD. Mild MR. Mild to mod TR. Sev AS by VTI.   AAA (abdominal aortic aneurysm) (HCC)    a. 05/2020 Abd  u/s: 3.6cm.   Aortic calcification (HCC) 05/01/2013   Bilateral renal artery stenosis (HCC)    a. 06/2013 Angio/PTA: LRA: 95 (5x18 Herculink stent), RRA 60ost; b. 04/2021 Angio: mod RRA stenosis w/ patent LRA stent.   Carotid arterial disease (HCC)    a. 05/2020 Carotid U/S: RICA 1-39%, LICA 40-59%   CHF (congestive heart failure) (HCC)    Chronic kidney disease    COPD (chronic obstructive pulmonary disease) (HCC)    Coronary artery disease    a. 2013 s/p CABG x 3 (LIMA->LAD, VG->OM, VG->RPDA; b. 06/2012 MV: no ischemia; c. 02/2021 Cath: LM 100, RCA 90ost, 150m, free LIMA->LAD nl, VG->OM2 nl, VG->RPDA nl. Mod AS (mean grad , AVA 1.1cm^2). RHC w/ elev filling pressures.   History of prior cigarette smoking 04/11/2008   Qualifier: Diagnosis of  By: Ermalene Searing MD, Amy     Hyperlipidemia    Hypertension    Hypothyroidism    Intraventricular hemorrhage (HCC) 07/12/2012   Ischemic cardiomyopathy    a. 06/2011 Echo: EF 35-40%; b. 06/2012 Echo: EF 50%; c. 12/2016 Echo: EF 55-60%, no rwma, GRI DD; d. 02/2021 Echo: Ef 55%, GrII DD.   Moderate aortic stenosis    a. 02/2021 Echo: EF 55%, GrII DD. Sev Ca2+ of AoV. Sev AS w/ AVA by VTI 0.79cm^2; b. 02/2021 Cath: Mod AS w/ mean grad . AVA 1.1cm^2.   PAF (paroxysmal atrial fibrillation) (HCC)    Peripheral arterial disease (HCC)    a. Previous left lower extremity  stenting by Dr. Evette Cristal;  b. 12/2012 s/p bilat ostial common iliac stenting; c. 04/2021 Angio: Patent LRA stent, mod RRA stenosis. Small AAA. Sev plaque RSFA 2/ subtl occl of inf branch of profunda. Diff Ca2+ SFA dzs w/ collats from profunda and 3V runoff. Diffuse LSFA dzs w/ collats from profunda. Subtl PT dzs. 3V runoff-->Med Rx.   Right leg DVT (HCC) 08/13/2012   Subclavian artery stenosis, left (HCC) 05/2012   Status post stenting of the ostium and self-expanding stent placement to the left axillary artery   Venous insufficiency     Past Surgical History:  Procedure Laterality Date    ABDOMINAL ANGIOGRAM  07/10/2013   WITH BI-FEMORAL RUNOFF       DR Kirke Corin   ABDOMINAL AORTAGRAM N/A 12/26/2012   Procedure: ABDOMINAL Ronny Flurry;  Surgeon: [name], MD;  Location: [facility] CATH [facility];  Service: Cardiovascular;  Laterality: N/A;   ABDOMINAL AORTAGRAM N/A 07/10/2013   Procedure: ABDOMINAL Ronny Flurry;  Surgeon: [name], MD;  Location: [facility];  Service: Cardiovascular;  Laterality: N/A;   ABDOMINAL AORTOGRAM W/LOWER EXTREMITY N/A 05/12/2021   Procedure: ABDOMINAL AORTOGRAM W/LOWER EXTREMITY;  Surgeon: [name], MD;  Location: [facility] INVASIVE CV LAB;  Service: Cardiovascular;  Laterality: N/A;   ANGIOPLASTY / STENTING FEMORAL  05/2012   ANGIOPLASTY / STENTING ILIAC Bilateral 12/26/2012   ARCH AORTOGRAM N/A 06/20/2012   Procedure: ARCH AORTOGRAM;  Surgeon: [name], MD;  Location: [facility];  Service: Cardiovascular;  Laterality: N/A;   CARDIAC CATHETERIZATION  05/2012   [facility]   CARDIOVERSION N/A 05/23/2022   Procedure: CARDIOVERSION;  Surgeon: Dolores Patty, MD;  Location: [facility] ORS;  Service: Cardiovascular;  Laterality: N/A;   CARDIOVERSION N/A 06/14/2022   Procedure: CARDIOVERSION;  Surgeon: Dolores Patty, MD;  Location: [facility] ORS;  Service: Cardiovascular;  Laterality: N/A;   CHOLECYSTECTOMY  1990's   CORONARY ARTERY BYPASS GRAFT  07/11/2011   Procedure: CORONARY ARTERY BYPASS GRAFTING (CABG);  Surgeon: [name], MD;  Location[facility] OR;  Service: Open Heart Surgery;  Laterality: N/A;  Times 3. On Pump. Using right greater saphenous vein and left internal mammary artery.    CORONARY ARTERY BYPASS GRAFT     HERNIA REPAIR     INSERTION OF ILIAC STENT Bilateral 12/26/2012   Procedure: INSERTION OF ILIAC STENT;  Surgeon: [name], MD;  Location: [facility];  Service: Cardiovascular;  Laterality: Bilateral;  Bilateral Common Iliac Artery   LEFT AND RIGHT HEART CATHETERIZATION WITH CORONARY ANGIOGRAM  07/07/2011   Procedure: LEFT AND  RIGHT HEART CATHETERIZATION WITH CORONARY ANGIOGRAM;  Surgeon: [name], MD;  Location: [facility] C[facility];  Service: Cardiovascular;;   LOWER EXTREMITY VENOGRAPHY Bilateral 12/30/2016   Procedure: Lower Extremity Venography;  Surgeon: Renford Dills, MD;  Location: [facility] INVASIVE CV LAB;  Service: Cardiovascular;  Laterality: Bilateral;   PERCUTANEOUS STENT INTERVENTION  06/20/2012   Procedure: PERCUTANEOUS STENT INTERVENTION;  Surgeon: [name], MD;  Location: [facility];  Service: Cardiovascular;;   RENAL ARTERY ANGIOPLASTY Left 07/10/2013   DR ARIDA   RIGHT/LEFT HEART CATH AND CORONARY/GRAFT ANGIOGRAPHY N/A 03/15/2021   Procedure: RIGHT/LEFT HEART CATH AND CORONARY/GRAFT ANGIOGRAPHY;  Surgeon: [name], MD;  Location: [facility] INVASIVE CV LAB;  Service: Cardiovascular;  Laterality: N/A;   SUBCLAVIAN ARTERY STENT  05/2012   "2 stents" (12/26/2012)   TEE WITHOUT CARDIOVERSION N/A 05/23/2022   Procedure: TRANSESOPHAGEAL ECHOCARDIOGRAM (TEE);  Surgeon: Dolores Patty, MD;  Location: [facility] ORS;  Service: Cardiovascular;  Laterality: N/A;   TOOTH EXTRACTION  Spring 2017   mulitple (6)   VENA CAVA FILTER PLACEMENT  07/2012   Removable     reports that he quit smoking about 11 years ago. His smoking use included cigarettes. He has a 60.00 pack-year smoking history. He has never used smokeless tobacco. He reports that he does not drink alcohol and does not use drugs.  Allergies  Allergen Reactions   Plavix [Clopidogrel Bisulfate]     Brain hemorrhage prev while on plavix and aspirin    Family History  Problem Relation Age of Onset   Alcohol abuse Father    Cirrhosis Father    Hypothyroidism Sister      Prior to Admission medications   Medication Sig Start Date End Date Taking? Authorizing Provider  albuterol (VENTOLIN HFA) 108 (90 Base) MCG/ACT inhaler Inhale 2 puffs into the lungs every 6 (six) hours as needed for wheezing or shortness of breath. 03/25/22    Sunnie Nielsen, DO  amiodarone (PACERONE) 200 MG tablet TAKE 1 TABLET BY MOUTH TWICE A DAY 08/29/22   Bensimhon, Bevelyn Buckles, MD  amoxicillin-clavulanate (AUGMENTIN) 875-125 MG tablet Take 1 tablet by mouth 2 (two) times daily. 09/28/22 10/15/22  [provider]  atorvastatin (LIPITOR) 20 MG tablet TAKE 1 TABLET BY MOUTH EVERY DAY 12/24/21   [name], MD  augmented betamethasone dipropionate (DIPROLENE-AF) 0.05 % cream APPLY TO AFFECTED AREA TWICE A DAY 03/14/22   [provider]  empagliflozin (JARDIANCE) 10 MG TABS tablet Take 1 tablet (10 mg total) by mouth daily before breakfast. 06/20/22   Sabharwal, Aditya, DO  furosemide (LASIX) 40 MG tablet Take 1 tablet (40 mg total) by mouth 2 (two) times daily. 08/15/22   Copland, Karleen Hampshire, MD  HYDROcodone-acetaminophen (NORCO/VICODIN) 5-325 MG tablet Take 1 tablet by mouth every 4 (four) hours as needed for moderate pain or severe pain. 09/28/22   [provider]  levothyroxine (SYNTHROID) 150 MCG tablet TAKE ONE TABLET BY MOUTH EVERY DAY BEFORE BREAKFAST 07/08/22   Copland, Karleen Hampshire, MD  metoprolol succinate (TOPROL XL) 25 MG 24 hr tablet Take 1 tablet (25 mg total) by mouth 2 (two) times daily. 05/16/22   Bensimhon, Bevelyn Buckles, MD  warfarin (COUMADIN) 5 MG tablet Take 2.5 mg by mouth daily. 1/2 tab daily except 1 tab on mon,wed,frid    [provider]    Physical Exam: Vitals:   10/11/22 1110 10/11/22 1111 10/11/22 1245  BP:  (!) 149/56 129/60  Pulse:  (!) 58 (!) 58  Resp:  16 16  Temp:  97.8 F (36.6 C)   TempSrc:  Oral   SpO2:  100% 95%  Weight: 68 kg    Height: 5\' 9"  (1.753 m)      Constitutional: NAD, calm, comfortable Vitals:   10/11/22 1110 10/11/22 1111 10/11/22 1245  BP:  (!) 149/56 129/60  Pulse:  (!) 58 (!) 58  Resp:  16 16  Temp:  97.8 F (36.6 C)   TempSrc:  Oral   SpO2:  100% 95%  Weight: 68 kg    Height: 5\' 9"  (1.753 m)     Eyes: PERRL, lids and conjunctivae normal ENMT: Mucous  membranes are moist. Posterior pharynx clear of any exudate or lesions.Normal dentition.  Neck: normal, supple, no masses, no thyromegaly Respiratory: clear to auscultation bilaterally, no wheezing, no crackles. Normal respiratory effort. No accessory muscle use.  Cardiovascular: Regular rate and rhythm, no murmurs / rubs /  gallops. No extremity edema. 2+ pedal pulses. No carotid bruits.  Abdomen: no tenderness, no masses palpated. No hepatosplenomegaly. Bowel sounds positive.  Musculoskeletal: no clubbing / cyanosis. No joint deformity upper and lower extremities. Good ROM, no contractures. Normal muscle tone.  Skin: Discoloration of right foot with ulcer on dorsal side of third and fourth toe severe tenderness Neurologic: CN 2-12 grossly intact. Sensation intact, DTR normal. Strength 5/5 in all 4.  Psychiatric: Normal judgment and insight. Alert and oriented x 3. Normal mood.    Labs on Admission: I have personally reviewed following labs and imaging studies  CBC: Recent Labs  Lab 10/11/22 1056 10/11/22 1205  WBC 10.2  --   NEUTROABS 7.8*  --   HGB 12.4* 13.6  HCT 40.7 40.0  MCV 86.4  --   PLT 336  --    Basic Metabolic Panel: Recent Labs  Lab 10/11/22 1056 10/11/22 1205  NA 137 138  K 3.5 3.7  CL 100 103  CO2 26  --   GLUCOSE 89 85  BUN 20 24*  CREATININE 1.62* 1.60*  CALCIUM 8.3*  --    GFR: Estimated Creatinine Clearance: 34.8 mL/min (A) (by C-G formula based on SCr of 1.6 mg/dL (H)). Liver Function Tests: No results for input(s): "AST", "ALT", "ALKPHOS", "BILITOT", "PROT", "ALBUMIN" in the last 168 hours. No results for input(s): "LIPASE", "AMYLASE" in the last 168 hours. No results for input(s): "AMMONIA" in the last 168 hours. Coagulation Profile: Recent Labs  Lab 10/11/22 1056  INR 2.5*   Cardiac Enzymes: No results for input(s): "CKTOTAL", "CKMB", "CKMBINDEX", "TROPONINI" in the last 168 hours. BNP (last 3 results) No results for input(s): "PROBNP" in  the last 8760 hours. HbA1C: No results for input(s): "HGBA1C" in the last 72 hours. CBG: No results for input(s): "GLUCAP" in the last 168 hours. Lipid Profile: No results for input(s): "CHOL", "HDL", "LDLCALC", "TRIG", "CHOLHDL", "LDLDIRECT" in the last 72 hours. Thyroid Function Tests: No results for input(s): "TSH", "T4TOTAL", "FREET4", "T3FREE", "THYROIDAB" in the last 72 hours. Anemia Panel: No results for input(s): "VITAMINB12", "FOLATE", "FERRITIN", "TIBC", "IRON", "RETICCTPCT" in the last 72 hours. Urine analysis:    Component Value Date/Time   COLORURINE YELLOW 03/24/2021 1547   APPEARANCEUR CLEAR 03/24/2021 1547   LABSPEC 1.010 03/24/2021 1547   PHURINE 6.5 03/24/2021 1547   GLUCOSEU NEGATIVE 03/24/2021 1547   HGBUR NEGATIVE 03/24/2021 1547   BILIRUBINUR NEGATIVE 03/24/2021 1547   KETONESUR NEGATIVE 03/24/2021 1547   PROTEINUR NEGATIVE 12/22/2016 1121   UROBILINOGEN 0.2 03/24/2021 1547   NITRITE NEGATIVE 03/24/2021 1547   LEUKOCYTESUR NEGATIVE 03/24/2021 1547    Radiological Exams on Admission: CT Angio Aortobifemoral W and/or Wo Contrast  Result Date: 10/11/2022 CLINICAL DATA:  Right lower extremity ischemia, delayed wound healing. Decreased contrast dose administered due to renal function. Repeat imaging EXAM: CT ANGIOGRAPHY OF ABDOMINAL AORTA WITH ILIOFEMORAL RUNOFF TECHNIQUE: Multidetector CT imaging of the abdomen, pelvis and lower extremities was performed using the standard protocol during bolus administration of intravenous contrast. Multiplanar CT image reconstructions and MIPs were obtained to evaluate the vascular anatomy. RADIATION DOSE REDUCTION: This exam was performed according to the departmental dose-optimization program which includes automated exposure control, adjustment of the mA and/or kV according to patient size and/or use of iterative reconstruction technique. CONTRAST:  80mL OMNIPAQUE IOHEXOL 350 MG/ML SOLN COMPARISON:  Prior CT scan of the abdomen  and pelvis 02/06/2022 FINDINGS: VASCULAR Aorta: Irregular bilobed infrarenal abdominal aortic aneurysm measures up to 5.5 x 5.2 cm  on the coronal and sagittal reformatted images. There is extensive wall adherent mural thrombus. Celiac: Calcified plaque at the origin of the celiac artery results in mild to moderate focal stenosis. SMA: Calcified plaque in the proximal SMA results in at least mild stenosis. Renals: Solitary renal arteries bilaterally. A renal stent is present in the left renal artery, however the artery appears occluded beyond the terminus of the stent. Calcified plaque results in at least moderate stenosis of the right renal artery. IMA: Diminutive but patent. RIGHT Lower Extremity Inflow: Patent kissing iliac stents. Significant stenosis at the origin of the internal iliac artery. The external iliac artery is heavily calcified but remains patent. Outflow: Bulky calcified atherosclerotic plaque along the posterior wall of the common femoral artery. At least moderate stenosis at the origins of the duplicated profunda femoral artery. The superficial femoral artery is heavily diseased with critical stenosis proximally. Multifocal moderate stenosis present throughout the mid thigh. The vessel likely occludes completely for short segment in Hunter's canal. The popliteal artery is small in caliber and heavily diseased with bulky calcified atherosclerotic plaque. Runoff: Patent 3 vessel runoff to the ankle. LEFT Lower Extremity Inflow: Widely patent kissing iliac stents. Scattered plaque along the external iliac artery without significant stenosis. Outflow: Bulky plaque along the posterior wall of the common femoral artery without significant stenosis or occlusion. The profunda femoral branches are patent. Critical stenosis bordering on occlusion of the proximal superficial femoral artery. The remainder of the SFA is heavily diseased with bulky calcifications throughout. The artery reconstitutes as it exits  Hunter's canal. The popliteal artery is heavily disease with multifocal bulky calcifications in fibrofatty atherosclerotic plaque. Runoff: Patent 3 vessel runoff to the ankle. Veins: Enlarged right superficial great saphenous vein. Extensive tortuous venous collaterals on the left as well. Findings suggest bilateral superficial venous insufficiency. Review of the MIP images confirms the above findings. NON-VASCULAR Lower chest: Cardiomegaly with extensive atherosclerotic calcifications along the coronary arteries. Diffuse bronchial wall thickening. Centrilobular pulmonary emphysema. Hepatobiliary: No focal liver abnormality is seen. Status post cholecystectomy. No biliary dilatation. Pancreas: Unremarkable. No pancreatic ductal dilatation or surrounding inflammatory changes. Spleen: No splenic injury or perisplenic hematoma. Adrenals/Urinary Tract: Normal adrenal glands. Renal size discrepancy. The left kidney is small relative to the right likely due to significant stenosis of the left renal artery beyond the previously placed renal artery stent. Multiple circumscribed water attenuation simple cysts along the right kidney. No imaging follow-up is recommended. The ureters are unremarkable. The bladder is distended. Stomach/Bowel: No focal bowel wall thickening or evidence of obstruction. Lymphatic: No suspicious lymphadenopathy. Reproductive: Mild prostatomegaly. Other: No abdominal wall hernia or abnormality. No abdominopelvic ascites. Musculoskeletal: Straightening of the normal lumbar lordosis. No acute fracture or malalignment. Multilevel degenerative changes. Mild bilateral hip joint degenerative osteoarthritis. IMPRESSION: VASCULAR 1. Irregular bilobed infrarenal abdominal aortic aneurysm measuring up to 5.5 cm. Recommend referral to a vascular specialist. This recommendation follows ACR consensus guidelines: White Paper of the ACR Incidental Findings Committee II on Vascular Findings. J Am Coll Radiol 2013;  10:789-794. 2. Severe bilateral femoropopliteal peripheral arterial disease as described above. 3. Large tortuous venous varicosities and great saphenous veins suggests chronic venous insufficiency as well. This can also be a source for chronic nonhealing lower extremity wounds. 4. Widely patent kissing iliac stents. 5. Chronic occlusion of left renal artery stent. 6. Celiac and SMA artery origins stenoses. NON-VASCULAR 1. Cardiomegaly with coronary artery disease. 2. Centrilobular pulmonary emphysema. 3. Distended bladder suggests the possibility of bladder outlet obstruction  from BPH. 4. Multilevel degenerative disc disease with straightening of the normal lumbar lordosis. 5. Bilateral hip joint degenerative osteoarthritis. Aortic aneurysm NOS (ICD10-I71.9); Aortic Atherosclerosis (ICD10-I70.0) and Emphysema (ICD10-J43.9). Electronically Signed   By: Malachy Moan M.D.   On: 10/11/2022 13:58    EKG: Ordered  Assessment/Plan Principal Problem:   Limb ischemia Active Problems:   Abdominal aortic aneurysm (HCC)   Peripheral arterial disease (HCC)   PAF (paroxysmal atrial fibrillation) (HCC)   Critical limb ischemia of right lower extremity with ulceration of foot (HCC)  (please populate well all problems here in Problem List. (For example, if patient is on BP meds at home and you resume or decide to hold them, it is a problem that needs to be her. Same for CAD, COPD, HLD and so on)  Acute/subacute right foot ischemia Dry gangrene of right forefoot -Start heparin drip -Emergency angiogram as per vascular surgeon -Risk of limb loss is high given the history of severe PVD and stage of dry gangrene of right foot, patient made aware -Completed 10-day course of antibiotics, currently all his symptoms and signs imply no active infection, hold off further antibiotics treatment -Alternate oxycodone and Dilaudid  AAA -Vascular surgery aware  HTN Chronic HFrEF -Euvolemic, continue Lasix,  metoprolol -BP controlled  PVD -Continue systemic anticoagulation and statin  PAF DVT -In sinus rhythm, continue metoprolol, continue amiodarone -Warfarin to heparin to address acute limb ischemia  Hypothyroidism -Continue Synthroid  DVT prophylaxis: Heparin subcu Code Status: Full code Family Communication: None at bedside Disposition Plan: Patient is sick with ischemic limb acute, requiring inpatient vascular surgeon consultation and procedure, expect more than 2 midnight hospital. Consults called: Vascular surgery Admission status: Telemetry admission   Emeline General MD [facility] Pager [phone]  10/11/2022, 2:38 PM

## 2022-10-11 NOTE — Consult Note (Addendum)
Hospital Consult  VASCULAR SURGERY ASSESSMENT & PLAN:   PERIPHERAL ARTERIAL DISEASE WITH NONHEALING WOUND RIGHT FOOT: He has a nonhealing wound of the right foot with evidence of multilevel arterial occlusive disease.  We have ordered a vein map.  Based on his CT angio it looks like he would require a right femoral to below-knee pop bypass.  He has moderate to severe aortic stenosis and would require preoperative cardiac evaluation.  However I think he does need an angiogram to further assess his options.  He was scheduled to have an angiogram next week by Dr. Kirke Corin, but given that the foot has gotten worse I think we need to proceed sooner.  BILATERAL CAROTID BRUITS: Based on his duplex scan in March he has a asymptomatic 40 to 59% left carotid stenosis with no significant stenosis on the right.  Dr. Kirke Corin is following his carotid disease.  ABDOMINAL AORTIC ANEURYSM: His aneurysm is now 5.5 cm in maximum diameter.  We would consider elective repair and a normal risk patient for an aneurysm of this size.  However he is at high risk given his age and moderate to severe aortic stenosis.  In addition, currently the right foot is the more pressing issue.  ANTICOAGULATION: His Coumadin will need to be held and he will need to be on heparin pending arteriography.  His INR today is 2.5.  CHRONIC KIDNEY DISEASE: The patient has mild chronic kidney disease.  His GFR is 42.  Cari Caraway, MD 3:30 PM   Reason for Consult:  toe ulceration/gangrene Requesting Physician:  Cardiology MRN #:  [id number]  History of Present Illness: This is a 82 y.o. male who presented to the hospital with non healing wound right foot.   Pt has hx of extensive peripheral arterial disease with previous stenting of the left subclavian artery and left axillary artery, bilateral common iliac artery stenting and left renal artery stenting and is followed by Dr. Kirke Corin.    Lower extremity angiography was performed in January 23   which showed patent left renal artery stent with moderate right renal artery stenosis, small abdominal aortic aneurysm, patent bilateral iliac kissing stents extending into the distal aorta with mild restenosis.  No other significant iliac disease.  On the right side, there was severe plaque in the distal common femoral artery extending into the ostium of the SFA with subtotal occlusion of the inferior branch of the profunda, diffuse calcified SFA disease with collaterals from the profunda and three-vessel runoff below the knee.  On the left side, there was diffuse calcified disease of the SFA with collaterals from the profunda, subtotal occlusion of the popliteal artery behind the knee and three-vessel runoff below the knee.    Under appt tab, he has procedure scheduled with Dr. Kirke Corin on 6/28.  He had a small intracranial hemorrhage in 2014 which was suspected to be due to malignant hypertension and dual antiplatelet therapy. Shortly after that he was diagnosed with extensive right-sided DVT. Anticoagulation was contraindicated and thus an IVC filter was placed.  He has hx of bilateral occlusive DVT in 2018 that require catheter based therapy in [place].  CT scan last year revealed fusiform, infrarenal AAA measuring 4.5cm.    He has hx of CAD with CABG and had right leg GSV removed and he also has moderate to severe AS followed by Dr. Excell Seltzer.  He has hx of CHF.  His wife reports he had a cardioversion earlier this year for afib and it was successful.  He has hx of varicose veins with what sounds like laser ablations many years ago.   Went to see pt in the ER and he is in the CT scan and talked with his wife.  She tells me the pt had a sore develop on his toe about 2 weeks before labor day.  She treated this with neosporin.  She got in to see Dr. Alberteen Spindle (podiatry) and he was put on abx.  She tells me when he was seen at that time, he did have a doppler signal present.  He was seen again on 6/11 due  to pain and was sent to the ER where he had DVT study that was negative.  He wa sent home.  He was seen by Dr. Alberteen Spindle this morning and was told to go to the ER. She states that his toe is gangrenous.  They were told he is at risk of limb loss.  She tells me that he takes pain medication for his leg pain.  She states that about 4 days ago, he was walking with his walker and his legs gave out but recovered after about 20 minutes.  He is now getting around in a wheelchair, which is easier for her as she states that she is blind (does not have peripheral vision).   She states that he does have leg swelling and takes lasix for this.    The pt has hx of CKD and she was told that he would need IVF prior to angiogram.  His creatinine today is 1.6 down from 1.98 on 6/5. His INR today is 2.5. He has hx of bilateral inguinal hernia repairs.   He had non invasive studies in March that revealed right ICA stenosis of 1-39% and left ICA stenosis of 40-59%.  Aortoiliac duplex revealed elevated velocities of the left CIA.  His largest aortic diameter was 4.8cm.   ABI on 08/09/2022 revealed 0.66 on the right and 0.81 on the left and bilateral dampened monophasic waveforms.  Toe pressures were not detected.    The pt is on a statin for cholesterol management.  The pt is not on a daily aspirin.   Other AC:  coumadin The pt is on BB, diuretic for hypertension.   The pt is  on medication for diabetes PTA. 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 06/2011 Echo: EF 35-40%; b. 06/2012 Echo: EF 50%; c. 12/2016 Echo: EF 55-60%, no rwma, GRI DD, mild AS/MR, nl RV fxn, nl RVSP; d. 02/2021 Echo: EF 55%, GrII DD. Mild MR. Mild to mod TR. Sev AS by VTI.   AAA (abdominal aortic aneurysm) (HCC)    a. 05/2020 Abd u/s: 3.6cm.   Aortic calcification (HCC) 05/01/2013   Bilateral renal artery stenosis (HCC)    a. 06/2013 Angio/PTA: LRA: 95 (5x18  Herculink stent), RRA 60ost; b. 04/2021 Angio: mod RRA stenosis w/ patent LRA stent.   Carotid arterial disease (HCC)    a. 05/2020 Carotid U/S: RICA 1-39%, LICA 40-59%   CHF (congestive heart failure) (HCC)    Chronic kidney disease    COPD (chronic obstructive pulmonary disease) (HCC)    Coronary artery disease    a. 2013 s/p CABG x 3 (LIMA->LAD, VG->OM, VG->RPDA; b. 06/2012 MV: no ischemia; c. 02/2021 Cath: LM 100, RCA 90ost, 128m, free LIMA->LAD nl, VG->OM2 nl, VG->RPDA nl. Mod AS (mean grad , AVA 1.1cm^2). RHC w/ elev  filling pressures.   History of prior cigarette smoking 04/11/2008   Qualifier: Diagnosis of  By: Ermalene Searing MD, Amy     Hyperlipidemia    Hypertension    Hypothyroidism    Intraventricular hemorrhage (HCC) 07/12/2012   Ischemic cardiomyopathy    a. 06/2011 Echo: EF 35-40%; b. 06/2012 Echo: EF 50%; c. 12/2016 Echo: EF 55-60%, no rwma, GRI DD; d. 02/2021 Echo: Ef 55%, GrII DD.   Moderate aortic stenosis    a. 02/2021 Echo: EF 55%, GrII DD. Sev Ca2+ of AoV. Sev AS w/ AVA by VTI 0.79cm^2; b. 02/2021 Cath: Mod AS w/ mean grad . AVA 1.1cm^2.   PAF (paroxysmal atrial fibrillation) (HCC)    Peripheral arterial disease (HCC)    a. Previous left lower extremity stenting by Dr. Evette Cristal;  b. 12/2012 s/p bilat ostial common iliac stenting; c. 04/2021 Angio: Patent LRA stent, mod RRA stenosis. Small AAA. Sev plaque RSFA 2/ subtl occl of inf branch of profunda. Diff Ca2+ SFA dzs w/ collats from profunda and 3V runoff. Diffuse LSFA dzs w/ collats from profunda. Subtl PT dzs. 3V runoff-->Med Rx.   Right leg DVT (HCC) 08/13/2012   Subclavian artery stenosis, left (HCC) 05/2012   Status post stenting of the ostium and self-expanding stent placement to the left axillary artery   Venous insufficiency     Past Surgical History:  Procedure Laterality Date   ABDOMINAL ANGIOGRAM  07/10/2013   WITH BI-FEMORAL RUNOFF       DR Kirke Corin   ABDOMINAL AORTAGRAM N/A 12/26/2012   Procedure: ABDOMINAL  Ronny Flurry;  Surgeon: [name], MD;  Location: [facility];  Service: Cardiovascular;  Laterality: N/A;   ABDOMINAL AORTAGRAM N/A 07/10/2013   Procedure: ABDOMINAL Ronny Flurry;  Surgeon: [name], MD;  Location: [facility];  Service: Cardiovascular;  Laterality: N/A;   ABDOMINAL AORTOGRAM W/LOWER EXTREMITY N/A 05/12/2021   Procedure: ABDOMINAL AORTOGRAM W/LOWER EXTREMITY;  Surgeon: [name], MD;  Location: [facility] INVASIVE CV [facility];  Service: Cardiovascular;  Laterality: N/A;   ANGIOPLASTY / STENTING FEMORAL  05/2012   ANGIOPLASTY / STENTING ILIAC Bilateral 12/26/2012   ARCH AORTOGRAM N/A 06/20/2012   Procedure: ARCH AORTOGRAM;  Surgeon: [name], MD;  Location: [facility];  Service: Cardiovascular;  Laterality: N/A;   CARDIAC CATHETERIZATION  05/2012   [facility]   CARDIOVERSION N/A 05/23/2022   Procedure: CARDIOVERSION;  Surgeon: Dolores Patty, MD;  Location: [facility] ORS;  Service: Cardiovascular;  Laterality: N/A;   CARDIOVERSION N/A 06/14/2022   Procedure: CARDIOVERSION;  Surgeon: Dolores Patty, MD;  Location: [facility] ORS;  Service: Cardiovascular;  Laterality: N/A;   CHOLECYSTECTOMY  1990's   CORONARY ARTERY BYPASS GRAFT  07/11/2011   Procedure: CORONARY ARTERY BYPASS GRAFTING (CABG);  Surgeon: [name], MD;  Location[facility];  Service: Open Heart Surgery;  Laterality: N/A;  Times 3. On Pump. Using right greater saphenous vein and left internal mammary artery.    CORONARY ARTERY BYPASS GRAFT     HERNIA REPAIR     INSERTION OF ILIAC STENT Bilateral 12/26/2012   Procedure: INSERTION OF ILIAC STENT;  Surgeon: [name], MD;  Location: [facility];  Service: Cardiovascular;  Laterality: Bilateral;  Bilateral Common Iliac Artery   LEFT AND RIGHT HEART CATHETERIZATION WITH CORONARY ANGIOGRAM  07/07/2011   Procedure: LEFT AND RIGHT HEART CATHETERIZATION WITH CORONARY ANGIOGRAM;  Surgeon: [name], MD;  Location: [facility];  Service: Cardiovascular;;    LOWER EXTREMITY VENOGRAPHY Bilateral  12/30/2016   Procedure: Lower Extremity Venography;  Surgeon: Renford Dills, MD;  Location: [facility] INVASIVE CV LAB;  Service: Cardiovascular;  Laterality: Bilateral;   PERCUTANEOUS STENT INTERVENTION  06/20/2012   Procedure: PERCUTANEOUS STENT INTERVENTION;  Surgeon: [name], MD;  Location: [facility]ATH [facility];  Service: Cardiovascular;;   RENAL ARTERY ANGIOPLASTY Left 07/10/2013   DR ARIDA   RIGHT/LEFT HEART CATH AND CORONARY/GRAFT ANGIOGRAPHY N/A 03/15/2021   Procedure: RIGHT/LEFT HEART CATH AND CORONARY/GRAFT ANGIOGRAPHY;  Surgeon: [name], MD;  Location: [facility] INVASIVE CV LAB;  Service: Cardiovascular;  Laterality: N/A;   SUBCLAVIAN ARTERY STENT  05/2012   "2 stents" (12/26/2012)   TEE WITHOUT CARDIOVERSION N/A 05/23/2022   Procedure: TRANSESOPHAGEAL ECHOCARDIOGRAM (TEE);  Surgeon: Dolores Patty, MD;  Location: [facility] ORS;  Service: Cardiovascular;  Laterality: N/A;   TOOTH EXTRACTION  Spring 2017   mulitple (6)   VENA CAVA FILTER PLACEMENT  07/2012   Removable    Allergies  Allergen Reactions   Plavix [Clopidogrel Bisulfate]     Brain hemorrhage prev while on plavix and aspirin    Prior to Admission medications   Medication Sig Start Date End Date Taking? Authorizing Provider  albuterol (VENTOLIN HFA) 108 (90 Base) MCG/ACT inhaler Inhale 2 puffs into the lungs every 6 (six) hours as needed for wheezing or shortness of breath. 03/25/22   Sunnie Nielsen, DO  amiodarone (PACERONE) 200 MG tablet TAKE 1 TABLET BY MOUTH TWICE A DAY 08/29/22   Bensimhon, Bevelyn Buckles, MD  amoxicillin-clavulanate (AUGMENTIN) 875-125 MG tablet Take 1 tablet by mouth 2 (two) times daily. 09/28/22 10/15/22  [provider]  atorvastatin (LIPITOR) 20 MG tablet TAKE 1 TABLET BY MOUTH EVERY DAY 12/24/21   [name], MD  augmented betamethasone dipropionate (DIPROLENE-AF) 0.05 % cream APPLY TO AFFECTED AREA TWICE A DAY 03/14/22   [provider]  empagliflozin (JARDIANCE) 10 MG TABS tablet Take 1 tablet (10 mg total) by mouth daily before breakfast. 06/20/22   Sabharwal, Aditya, DO  furosemide (LASIX) 40 MG tablet Take 1 tablet (40 mg total) by mouth 2 (two) times daily. 08/15/22   Copland, Karleen Hampshire, MD  HYDROcodone-acetaminophen (NORCO/VICODIN) 5-325 MG tablet Take 1 tablet by mouth every 4 (four) hours as needed for moderate pain or severe pain. 09/28/22   [provider]  levothyroxine (SYNTHROID) 150 MCG tablet TAKE ONE TABLET BY MOUTH EVERY DAY BEFORE BREAKFAST 07/08/22   Copland, Karleen Hampshire, MD  metoprolol succinate (TOPROL XL) 25 MG 24 hr tablet Take 1 tablet (25 mg total) by mouth 2 (two) times daily. 05/16/22   Bensimhon, Bevelyn Buckles, MD  warfarin (COUMADIN) 5 MG tablet Take 2.5 mg by mouth daily. 1/2 tab daily except 1 tab on mon,wed,frid    [provider]    Social History   Socioeconomic History   Marital status: Married    Spouse name: Not on file   Number of children: 2   Years of education: Not on file   Highest education level: Not on file  Occupational History   Occupation: Retired    Associate Professor: RETIRED    Comment: Comptroller  Tobacco Use   Smoking status: Former    Packs/day: 1.00    Years: 60.00    Additional pack years: 0.00    Total pack years: 60.00    Types: Cigarettes    Quit date: 12/25/2010    Years since quitting: 11.8   Smokeless tobacco: Never  Vaping Use   Vaping Use: Never  used  Substance and Sexual Activity   Alcohol use: No   Drug use: No   Sexual activity: Not Currently  Other Topics Concern   Not on file  Social History Narrative   Lives with wife, [name]   Exercising 3 times a week.    Moderate diet control.   2 dogs   Social Determinants of Health   Financial Resource Strain: Low Risk  (07/13/2022)   Overall Financial Resource Strain (CARDIA)    Difficulty of Paying Living Expenses: Not hard at all  Food Insecurity: No Food Insecurity (07/13/2022)   Hunger  Vital Sign    Worried About Running Out of Food in the Last Year: Never true    Ran Out of Food in the Last Year: Never true  Transportation Needs: No Transportation Needs (07/13/2022)   PRAPARE - Administrator, Civil Service (Medical): No    Lack of Transportation (Non-Medical): No  Physical Activity: Insufficiently Active (07/13/2022)   Exercise Vital Sign    Days of Exercise per Week: 7 days    Minutes of Exercise per Session: 20 min  Stress: No Stress Concern Present (07/13/2022)   [employer] of Occupational Health - Occupational Stress Questionnaire    Feeling of Stress : Not at all  Social Connections: Moderately Integrated (07/13/2022)   Social Connection and Isolation Panel [NHANES]    Frequency of Communication with Friends and Family: More than three times a week    Frequency of Social Gatherings with Friends and Family: More than three times a week    Attends Religious Services: Never    Database administrator or Organizations: Yes    Attends Engineer, structural: More than 4 times per year    Marital Status: Married  Catering manager Violence: Not At Risk (07/13/2022)   Humiliation, Afraid, Rape, and Kick questionnaire    Fear of Current or Ex-Partner: No    Emotionally Abused: No    Physically Abused: No    Sexually Abused: No     Family History  Problem Relation Age of Onset   Alcohol abuse Father    Cirrhosis Father    Hypothyroidism Sister     ROS: [x]  Positive   [ ]  Negative   [ ]  All sytems reviewed and are negative  Cardiac: [x]  CAD/CABG 2013 [x]  afib with hx of cardioversion earlier this year [x]  CHF [x]  mod-severe AS  Vascular: [x]  pain in legs while walking [x]  pain in legs at rest [x]  pain in legs at night [x]  non-healing ulcers [x]  hx of DVT-on coumadin [x]  swelling in legs [x]  bilateral renal artery stenosis [x]  stenting of left axillary and subclavian arteries [x]  venous insufficiency  Pulmonary: []   asthma/wheezing []  home O2  Neurologic: []  hx of CVA []  mini stroke   Hematologic: []  hx of cancer  Endocrine:   [x]  diabetes [x]  thyroid disease  GI []  GERD  GU: [x]  CKD/renal failure []  HD--[]  M/W/F or []  T/T/S  Psychiatric: []  anxiety []  depression  Musculoskeletal: [x]  arthritis []  joint pain  Integumentary: []  rashes []  ulcers  Constitutional: []  fever  []  chills  Physical Examination  Vitals:   10/11/22 1111  BP: (!) 149/56  Pulse: (!) 58  Resp: 16  Temp: 97.8 F (36.6 C)  SpO2: 100%   Body mass index is 22.14 kg/m.  General:  WDWN in NAD Gait: Not observed HENT: WNL, normocephalic Pulmonary: normal non-labored breathing Cardiac: regular, without carotid bruits bilaterally Abdomen:  soft, NT  Skin: without rashes Vascular Exam/Pulses:  Right Left  Femoral 1+ (weak) 1+ (weak)  Popliteal Unable to palpate Unable to palpate  DP Dampened monophasic doppler Dampened monophasic doppler  PT Dampened monophasic doppler Dampened monophasic doppler   Extremities:          Musculoskeletal: no muscle wasting or atrophy  Neurologic: A&O X 3 Psychiatric:  The pt has Normal affect.   CBC    Component Value Date/Time   WBC 10.2 10/11/2022 1056   RBC 4.71 10/11/2022 1056   HGB 13.6 10/11/2022 1205   HGB 14.4 05/06/2021 0953   HCT 40.0 10/11/2022 1205   HCT 42.6 05/06/2021 0953   PLT 336 10/11/2022 1056   PLT 199 05/06/2021 0953   MCV 86.4 10/11/2022 1056   MCV 87 05/06/2021 0953   MCV 87 06/03/2013 0454   MCH 26.3 10/11/2022 1056   MCHC 30.5 10/11/2022 1056   RDW 18.2 (H) 10/11/2022 1056   RDW 13.6 05/06/2021 0953   RDW 14.2 06/03/2013 0454   LYMPHSABS 1.1 10/11/2022 1056   LYMPHSABS 1.8 05/06/2021 0953   LYMPHSABS 2.7 07/28/2012 0513   MONOABS 0.9 10/11/2022 1056   MONOABS 1.4 (H) 07/28/2012 0513   EOSABS 0.2 10/11/2022 1056   EOSABS 0.3 05/06/2021 0953   EOSABS 0.3 07/28/2012 0513   BASOSABS 0.1 10/11/2022 1056   BASOSABS  0.1 05/06/2021 0953   BASOSABS 1 06/03/2013 0454   BASOSABS 0.1 07/28/2012 0513    BMET    Component Value Date/Time   NA 138 10/11/2022 1205   NA 142 05/06/2021 0953   NA 136 06/03/2013 0454   K 3.7 10/11/2022 1205   K 4.1 06/03/2013 0454   CL 103 10/11/2022 1205   CL 106 06/03/2013 0454   CO2 26 10/11/2022 1056   CO2 27 06/03/2013 0454   GLUCOSE 85 10/11/2022 1205   GLUCOSE 80 06/03/2013 0454   BUN 24 (H) 10/11/2022 1205   BUN 22 05/06/2021 0953   BUN 22 (H) 06/03/2013 0454   CREATININE 1.60 (H) 10/11/2022 1205   CREATININE 1.59 (H) 06/03/2013 0454   CREATININE 1.02 04/22/2011 1507   CALCIUM 8.3 (L) 10/11/2022 1056   CALCIUM 8.8 06/03/2013 0454   GFRNONAA 42 (L) 10/11/2022 1056   GFRNONAA 43 (L) 06/03/2013 0454   GFRAA 59 (L) 12/30/2016 0103   GFRAA 50 (L) 06/03/2013 0454    COAGS: Lab Results  Component Value Date   INR 2.5 (H) 10/11/2022   INR 2.3 (H) 09/28/2022   INR 3.2 (A) 09/15/2022     Non-Invasive Vascular Imaging:   ABI on 08/09/2022 +-------+-----------+------------+------------+------------+  ABI/TBIToday's ABIToday's TBI Previous ABIPrevious TBI  +-------+-----------+------------+------------+------------+  Right 0.66       not detected0.61        not detected  +-------+-----------+------------+------------+------------+  Left  0.81       not detected0.56        0.23          +-------+-----------+------------+------------+------------+   Aortoiliac duplex 06/24/2022 Abdominal Aorta Findings:  +-------------+-------+----------+----------+--------+--------+--------+  Location    AP (cm)Trans (cm)PSV (cm/s)WaveformThrombusComments  +-------------+-------+----------+----------+--------+--------+--------+  Mid         4.24   4.82      71                        fusiform  +-------------+-------+----------+----------+--------+--------+--------+  Distal      3.90             17                                   +-------------+-------+----------+----------+--------+--------+--------+  RT CIA Prox                   168                                 +-------------+-------+----------+----------+--------+--------+--------+  RT CIA Mid                    167                                 +-------------+-------+----------+----------+--------+--------+--------+  RT CIA Distal                 190                                 +-------------+-------+----------+----------+--------+--------+--------+  RT EIA Prox                   161                                 +-------------+-------+----------+----------+--------+--------+--------+  RT EIA Mid                    173                                 +-------------+-------+----------+----------+--------+--------+--------+  RT EIA Distal                 172                                 +-------------+-------+----------+----------+--------+--------+--------+  LT CIA Prox                   183                                 +-------------+-------+----------+----------+--------+--------+--------+  LT CIA Mid                    155                                 +-------------+-------+----------+----------+--------+--------+--------+  LT CIA Distal                 308                                 +-------------+-------+----------+----------+--------+--------+--------+  LT EIA Prox                   153                                 +-------------+-------+----------+----------+--------+--------+--------+   Visualization of the Superceliac artery, Proximal Abdominal Aorta, Left EIA Mid artery and Left EIA Distal artery was limited. Both the mid and distal aortic segments have increased in diameter.   Summary:  Abdominal Aorta: There is evidence of abnormal dilatation  of the mid and distal Abdominal aorta. The largest aortic measurement is 4.2 cm. The largest aortic  diameter has increased compared to prior exam. Previous diameter measurement was 3.8 cm obtained  on 04/22/21.  Stenosis: +-----------------+-------------+  Location         Stenosis       +-----------------+-------------+  Left Common Iliac>50% stenosis  +-----------------+-------------+   Carotid duplex 06/27/2022 Right:  1-39% ICA stenosis Left:  40-59% ICA stenosis Vertebrals:  Left vertebral artery demonstrates antegrade flow. Right  vertebral artery demonstrates retrograde flow.  Subclavians: Left subclavian artery was stenotic. Right subclavian artery flow was disturbed.     ASSESSMENT/PLAN: This is a 82 y.o. male with critical limb ischemia with non healing wound right foot that is worsening   CLI -plan for angiogram to evaluate.  Pt is on coumadin for hx multiple DVT's.  He will need admission to hospitalist and hold coumadin and start heparin when appropriate.    -CTA aortobifem with runoff done today - he has patent kissing iliac stents.  There is significant stenosis at the origin of the right IIA. He has posterior plaque in the CFA and moderate stenosis at the origins of the profunda.  The SFA is heavily diseased with critical stenosis proximally.  He has a completely occluded segment in Hunter's canal.  He has patent 3 vessel runoff to the ankle. He has patent 3 vessel runoff to the left ankle.   -plan for angiogram hopefully later this week.  Pt is at risk for limb loss.  Will get vein mapping to determine if he has conduit if he needs lower extremity bypass.  Of note, pt has Plavix allergy.   AAA -CT reveals increase in size of AAA now 5.5cm.  CKD IIIB -creatinine 1.6 today down from 1.98.  will need IV hydration  Bilateral carotid bruits -known bilateral ICA stenosis with right 1-39% and left 40-59% ICA stenosis March 2024.  -Dr. [name] to evaluate pt and determine further plan   Doreatha Massed, PA-C [facility] [phone]

## 2022-10-11 NOTE — Telephone Encounter (Signed)
Calling to speak to the dr about the patient, states the patient could possibly lose his leg. Please advise

## 2022-10-11 NOTE — Telephone Encounter (Signed)
I recommend that the patient go to the ED for further evaluation and consultation with vascular surgery if there is concern for acute limb ischemia, as Dr. Kirke Corin is not in the office this week.  Yvonne Kendall, MD [facility]

## 2022-10-11 NOTE — ED Notes (Signed)
ED TO INPATIENT HANDOFF REPORT  ED Nurse Name and Phone #: [phone]  S Name/Age/Gender [name] 82 y.o. male Room/Bed: 032C/032C  Code Status   Code Status: Full Code  Home/SNF/Other Home Patient oriented to: self, place, time, and situation Is this baseline? Yes   Triage Complete: Triage complete  Chief Complaint Limb ischemia [I99.8]  Triage Note Pt to the ed from dr office with a CC of foot pain. Pt has wound to 3 toe that he has been following with vascular with. Pt is scheduled to get a vascular surgery  on the 28th. Pt was in podiatry off this morning and ws directed to come straight to ED for care. Pt denies fever, chills, nausea, vomiting.    Allergies Allergies  Allergen Reactions   Plavix [Clopidogrel Bisulfate]     Brain hemorrhage prev while on plavix and aspirin    Level of Care/Admitting Diagnosis ED Disposition     ED Disposition  Admit   Condition  --   Comment  Hospital Area: [facility] [[id number]]  Level of Care: Telemetry Medical [104]  May admit patient to [facility] or [facility] if equivalent level of care is available:: No  Covid Evaluation: Asymptomatic - no recent exposure (last 10 days) testing not required  Diagnosis: Limb ischemia [161096]  Admitting Physician: Emeline General [[id number]]  Attending Physician: Emeline General [[id number]]  Certification:: I certify this patient will need inpatient services for at least 2 midnights  Estimated Length of Stay: 3          B Medical/Surgery History Past Medical History:  Diagnosis Date   (HFimpEF) heart failure with improved ejection fraction (HCC)    a. 06/2011 Echo: EF 35-40%; b. 06/2012 Echo: EF 50%; c. 12/2016 Echo: EF 55-60%, no rwma, GRI DD, mild AS/MR, nl RV fxn, nl RVSP; d. 02/2021 Echo: EF 55%, GrII DD. Mild MR. Mild to mod TR. Sev AS by VTI.   AAA (abdominal aortic aneurysm) (HCC)    a. 05/2020 Abd u/s: 3.6cm.   Aortic calcification (HCC) 05/01/2013   Bilateral  renal artery stenosis (HCC)    a. 06/2013 Angio/PTA: LRA: 95 (5x18 Herculink stent), RRA 60ost; b. 04/2021 Angio: mod RRA stenosis w/ patent LRA stent.   Carotid arterial disease (HCC)    a. 05/2020 Carotid U/S: RICA 1-39%, LICA 40-59%   CHF (congestive heart failure) (HCC)    Chronic kidney disease    COPD (chronic obstructive pulmonary disease) (HCC)    Coronary artery disease    a. 2013 s/p CABG x 3 (LIMA->LAD, VG->OM, VG->RPDA; b. 06/2012 MV: no ischemia; c. 02/2021 Cath: LM 100, RCA 90ost, 161m, free LIMA->LAD nl, VG->OM2 nl, VG->RPDA nl. Mod AS (mean grad , AVA 1.1cm^2). RHC w/ elev filling pressures.   History of prior cigarette smoking 04/11/2008   Qualifier: Diagnosis of  By: Ermalene Searing MD, Amy     Hyperlipidemia    Hypertension    Hypothyroidism    Intraventricular hemorrhage (HCC) 07/12/2012   Ischemic cardiomyopathy    a. 06/2011 Echo: EF 35-40%; b. 06/2012 Echo: EF 50%; c. 12/2016 Echo: EF 55-60%, no rwma, GRI DD; d. 02/2021 Echo: Ef 55%, GrII DD.   Moderate aortic stenosis    a. 02/2021 Echo: EF 55%, GrII DD. Sev Ca2+ of AoV. Sev AS w/ AVA by VTI 0.79cm^2; b. 02/2021 Cath: Mod AS w/ mean grad . AVA 1.1cm^2.   PAF (paroxysmal atrial fibrillation) (HCC)    Peripheral arterial disease (HCC)  a. Previous left lower extremity stenting by Dr. Evette Cristal;  b. 12/2012 s/p bilat ostial common iliac stenting; c. 04/2021 Angio: Patent LRA stent, mod RRA stenosis. Small AAA. Sev plaque RSFA 2/ subtl occl of inf branch of profunda. Diff Ca2+ SFA dzs w/ collats from profunda and 3V runoff. Diffuse LSFA dzs w/ collats from profunda. Subtl PT dzs. 3V runoff-->Med Rx.   Right leg DVT (HCC) 08/13/2012   Subclavian artery stenosis, left (HCC) 05/2012   Status post stenting of the ostium and self-expanding stent placement to the left axillary artery   Venous insufficiency    Past Surgical History:  Procedure Laterality Date   ABDOMINAL ANGIOGRAM  07/10/2013   WITH BI-FEMORAL RUNOFF       DR  Kirke Corin   ABDOMINAL AORTAGRAM N/A 12/26/2012   Procedure: ABDOMINAL Ronny Flurry;  Surgeon: [name], MD;  Location: [facility];  Service: Cardiovascular;  Laterality: N/A;   ABDOMINAL AORTAGRAM N/A 07/10/2013   Procedure: ABDOMINAL Ronny Flurry;  Surgeon: [name], MD;  Location: [facility];  Service: Cardiovascular;  Laterality: N/A;   ABDOMINAL AORTOGRAM W/LOWER EXTREMITY N/A 05/12/2021   Procedure: ABDOMINAL AORTOGRAM W/LOWER EXTREMITY;  Surgeon: [name], MD;  Location: [facility] INVASIVE CV [facility];  Service: Cardiovascular;  Laterality: N/A;   ANGIOPLASTY / STENTING FEMORAL  05/2012   ANGIOPLASTY / STENTING ILIAC Bilateral 12/26/2012   ARCH AORTOGRAM N/A 06/20/2012   Procedure: ARCH AORTOGRAM;  Surgeon: [name], MD;  Location: [facility];  Service: Cardiovascular;  Laterality: N/A;   CARDIAC CATHETERIZATION  05/2012   [facility]   CARDIOVERSION N/A 05/23/2022   Procedure: CARDIOVERSION;  Surgeon: Dolores Patty, MD;  Location: [facility] ORS;  Service: Cardiovascular;  Laterality: N/A;   CARDIOVERSION N/A 06/14/2022   Procedure: CARDIOVERSION;  Surgeon: Dolores Patty, MD;  Location: [facility] ORS;  Service: Cardiovascular;  Laterality: N/A;   CHOLECYSTECTOMY  1990's   CORONARY ARTERY BYPASS GRAFT  07/11/2011   Procedure: CORONARY ARTERY BYPASS GRAFTING (CABG);  Surgeon: [name], MD;  Location: [facility] OR;  Service: Open Heart Surgery;  Laterality: N/A;  Times 3. On Pump. Using right greater saphenous vein and left internal mammary artery.    CORONARY ARTERY BYPASS GRAFT     HERNIA REPAIR     INSERTION OF ILIAC STENT Bilateral 12/26/2012   Procedure: INSERTION OF ILIAC STENT;  Surgeon: [name], MD;  Location: [facility];  Service: Cardiovascular;  Laterality: Bilateral;  Bilateral Common Iliac Artery   LEFT AND RIGHT HEART CATHETERIZATION WITH CORONARY ANGIOGRAM  07/07/2011   Procedure: LEFT AND RIGHT HEART CATHETERIZATION WITH CORONARY ANGIOGRAM;  Surgeon:  [name], MD;  Location: [facility];  Service: Cardiovascular;;   LOWER EXTREMITY VENOGRAPHY Bilateral 12/30/2016   Procedure: Lower Extremity Venography;  Surgeon: Renford Dills, MD;  Location: [facility] INVASIVE CV LAB;  Service: Cardiovascular;  Laterality: Bilateral;   PERCUTANEOUS STENT INTERVENTION  06/20/2012   Procedure: PERCUTANEOUS STENT INTERVENTION;  Surgeon: [name], MD;  Location: [facility];  Service: Cardiovascular;;   RENAL ARTERY ANGIOPLASTY Left 07/10/2013   DR ARIDA   RIGHT/LEFT HEART CATH AND CORONARY/GRAFT ANGIOGRAPHY N/A 03/15/2021   Procedure: RIGHT/LEFT HEART CATH AND CORONARY/GRAFT ANGIOGRAPHY;  Surgeon: [name], MD;  Location: [facility] INVASIVE CV LAB;  Service: Cardiovascular;  Laterality: N/A;   SUBCLAVIAN ARTERY STENT  05/2012   "2 stents" (12/26/2012)   TEE WITHOUT CARDIOVERSION N/A 05/23/2022   Procedure: TRANSESOPHAGEAL ECHOCARDIOGRAM (TEE);  Surgeon: Gala Romney,  Bevelyn Buckles, MD;  Location: [facility] ORS;  Service: Cardiovascular;  Laterality: N/A;   TOOTH EXTRACTION  Spring 2017   mulitple (6)   VENA CAVA FILTER PLACEMENT  07/2012   Removable     A IV Location/Drains/Wounds Patient Lines/Drains/Airways Status     Active Line/Drains/Airways     Name Placement date Placement time Site Days   Peripheral IV 10/11/22 20 G Posterior;Proximal;Right Forearm 10/11/22  1158  Forearm  less than 1            Intake/Output Last 24 hours No intake or output data in the 24 hours ending 10/11/22 1503  Labs/Imaging Results for orders placed or performed during the hospital encounter of 10/11/22 (from the past 48 hour(s))  CBC with Differential     Status: Abnormal   Collection Time: 10/11/22 10:56 AM  Result Value Ref Range   WBC 10.2 4.0 - 10.5 K/uL   RBC 4.71 4.22 - 5.81 MIL/uL   Hemoglobin 12.4 (L) 13.0 - 17.0 g/dL   HCT 40.9 81.1 - 91.4 %   MCV 86.4 80.0 - 100.0 fL   MCH 26.3 26.0 - 34.0 pg   MCHC 30.5 30.0 - 36.0 g/dL   RDW 78.2 (H)  95.6 - 15.5 %   Platelets 336 150 - 400 K/uL   nRBC 0.0 0.0 - 0.2 %   Neutrophils Relative % 75 %   Neutro Abs 7.8 (H) 1.7 - 7.7 K/uL   Lymphocytes Relative 11 %   Lymphs Abs 1.1 0.7 - 4.0 K/uL   Monocytes Relative 9 %   Monocytes Absolute 0.9 0.1 - 1.0 K/uL   Eosinophils Relative 2 %   Eosinophils Absolute 0.2 0.0 - 0.5 K/uL   Basophils Relative 1 %   Basophils Absolute 0.1 0.0 - 0.1 K/uL   Immature Granulocytes 2 %   Abs Immature Granulocytes 0.15 (H) 0.00 - 0.07 K/uL    Comment: Performed at [facility], [address], [place], [place] [address]  Basic metabolic panel     Status: Abnormal   Collection Time: 10/11/22 10:56 AM  Result Value Ref Range   Sodium 137 135 - 145 mmol/L   Potassium 3.5 3.5 - 5.1 mmol/L   Chloride 100 98 - 111 mmol/L   CO2 26 22 - 32 mmol/L   Glucose, Bld 89 70 - 99 mg/dL    Comment: Glucose reference range applies only to samples taken after fasting for at least 8 hours.   BUN 20 8 - 23 mg/dL   Creatinine, Ser 6.57 (H) 0.61 - 1.24 mg/dL   Calcium 8.3 (L) 8.9 - 10.3 mg/dL   GFR, Estimated 42 (L) >60 mL/min    Comment: (NOTE) Calculated using the CKD-EPI Creatinine Equation (2021)    Anion gap 11 5 - 15    Comment: Performed at [facility], [address], [place], [place] [address]  Protime-INR     Status: Abnormal   Collection Time: 10/11/22 10:56 AM  Result Value Ref Range   Prothrombin Time 27.5 (H) 11.4 - 15.2 seconds   INR 2.5 (H) 0.8 - 1.2    Comment: (NOTE) INR goal varies based on device and disease states. Performed at [facility], [address], [name], [place] [address]   I-stat chem 8, ED (not at [facility], [facility] or [facility])     Status: Abnormal   Collection Time: 10/11/22 12:05 PM  Result Value Ref Range   Sodium 138 135 - 145 mmol/L  Potassium 3.7 3.5 - 5.1 mmol/L   Chloride 103 98 - 111 mmol/L   BUN 24 (H) 8 - 23 mg/dL   Creatinine, Ser 1.61 (H) 0.61 - 1.24 mg/dL   Glucose, Bld 85 70 - 99 mg/dL    Comment:  Glucose reference range applies only to samples taken after fasting for at least 8 hours.   Calcium, Ion 1.07 (L) 1.15 - 1.40 mmol/L   TCO2 26 22 - 32 mmol/L   Hemoglobin 13.6 13.0 - 17.0 g/dL   HCT 09.6 04.5 - 40.9 %   CT Angio Aortobifemoral W and/or Wo Contrast  Result Date: 10/11/2022 CLINICAL DATA:  Right lower extremity ischemia, delayed wound healing. Decreased contrast dose administered due to renal function. Repeat imaging EXAM: CT ANGIOGRAPHY OF ABDOMINAL AORTA WITH ILIOFEMORAL RUNOFF TECHNIQUE: Multidetector CT imaging of the abdomen, pelvis and lower extremities was performed using the standard protocol during bolus administration of intravenous contrast. Multiplanar CT image reconstructions and MIPs were obtained to evaluate the vascular anatomy. RADIATION DOSE REDUCTION: This exam was performed according to the departmental dose-optimization program which includes automated exposure control, adjustment of the mA and/or kV according to patient size and/or use of iterative reconstruction technique. CONTRAST:  80mL OMNIPAQUE IOHEXOL 350 MG/ML SOLN COMPARISON:  Prior CT scan of the abdomen and pelvis 02/06/2022 FINDINGS: VASCULAR Aorta: Irregular bilobed infrarenal abdominal aortic aneurysm measures up to 5.5 x 5.2 cm on the coronal and sagittal reformatted images. There is extensive wall adherent mural thrombus. Celiac: Calcified plaque at the origin of the celiac artery results in mild to moderate focal stenosis. SMA: Calcified plaque in the proximal SMA results in at least mild stenosis. Renals: Solitary renal arteries bilaterally. A renal stent is present in the left renal artery, however the artery appears occluded beyond the terminus of the stent. Calcified plaque results in at least moderate stenosis of the right renal artery. IMA: Diminutive but patent. RIGHT Lower Extremity Inflow: Patent kissing iliac stents. Significant stenosis at the origin of the internal iliac artery. The external  iliac artery is heavily calcified but remains patent. Outflow: Bulky calcified atherosclerotic plaque along the posterior wall of the common femoral artery. At least moderate stenosis at the origins of the duplicated profunda femoral artery. The superficial femoral artery is heavily diseased with critical stenosis proximally. Multifocal moderate stenosis present throughout the mid thigh. The vessel likely occludes completely for short segment in Hunter's canal. The popliteal artery is small in caliber and heavily diseased with bulky calcified atherosclerotic plaque. Runoff: Patent 3 vessel runoff to the ankle. LEFT Lower Extremity Inflow: Widely patent kissing iliac stents. Scattered plaque along the external iliac artery without significant stenosis. Outflow: Bulky plaque along the posterior wall of the common femoral artery without significant stenosis or occlusion. The profunda femoral branches are patent. Critical stenosis bordering on occlusion of the proximal superficial femoral artery. The remainder of the SFA is heavily diseased with bulky calcifications throughout. The artery reconstitutes as it exits Hunter's canal. The popliteal artery is heavily disease with multifocal bulky calcifications in fibrofatty atherosclerotic plaque. Runoff: Patent 3 vessel runoff to the ankle. Veins: Enlarged right superficial great saphenous vein. Extensive tortuous venous collaterals on the left as well. Findings suggest bilateral superficial venous insufficiency. Review of the MIP images confirms the above findings. NON-VASCULAR Lower chest: Cardiomegaly with extensive atherosclerotic calcifications along the coronary arteries. Diffuse bronchial wall thickening. Centrilobular pulmonary emphysema. Hepatobiliary: No focal liver abnormality is seen. Status post cholecystectomy. No biliary dilatation. Pancreas: Unremarkable. No  pancreatic ductal dilatation or surrounding inflammatory changes. Spleen: No splenic injury or  perisplenic hematoma. Adrenals/Urinary Tract: Normal adrenal glands. Renal size discrepancy. The left kidney is small relative to the right likely due to significant stenosis of the left renal artery beyond the previously placed renal artery stent. Multiple circumscribed water attenuation simple cysts along the right kidney. No imaging follow-up is recommended. The ureters are unremarkable. The bladder is distended. Stomach/Bowel: No focal bowel wall thickening or evidence of obstruction. Lymphatic: No suspicious lymphadenopathy. Reproductive: Mild prostatomegaly. Other: No abdominal wall hernia or abnormality. No abdominopelvic ascites. Musculoskeletal: Straightening of the normal lumbar lordosis. No acute fracture or malalignment. Multilevel degenerative changes. Mild bilateral hip joint degenerative osteoarthritis. IMPRESSION: VASCULAR 1. Irregular bilobed infrarenal abdominal aortic aneurysm measuring up to 5.5 cm. Recommend referral to a vascular specialist. This recommendation follows ACR consensus guidelines: White Paper of the ACR Incidental Findings Committee II on Vascular Findings. J Am Coll Radiol 2013; 10:789-794. 2. Severe bilateral femoropopliteal peripheral arterial disease as described above. 3. Large tortuous venous varicosities and great saphenous veins suggests chronic venous insufficiency as well. This can also be a source for chronic nonhealing lower extremity wounds. 4. Widely patent kissing iliac stents. 5. Chronic occlusion of left renal artery stent. 6. Celiac and SMA artery origins stenoses. NON-VASCULAR 1. Cardiomegaly with coronary artery disease. 2. Centrilobular pulmonary emphysema. 3. Distended bladder suggests the possibility of bladder outlet obstruction from BPH. 4. Multilevel degenerative disc disease with straightening of the normal lumbar lordosis. 5. Bilateral hip joint degenerative osteoarthritis. Aortic aneurysm NOS (ICD10-I71.9); Aortic Atherosclerosis (ICD10-I70.0) and  Emphysema (ICD10-J43.9). Electronically Signed   By: Malachy Moan M.D.   On: 10/11/2022 13:58    Pending Labs Unresulted Labs (From admission, onward)     Start     Ordered   10/12/22 0500  Basic metabolic panel  Tomorrow morning,   R        10/11/22 1438   10/12/22 0500  CBC  Tomorrow morning,   R        10/11/22 1438            Vitals/Pain Today's Vitals   10/11/22 1109 10/11/22 1110 10/11/22 1111 10/11/22 1245  BP:   (!) 149/56 129/60  Pulse:   (!) 58 (!) 58  Resp:   16 16  Temp:   97.8 F (36.6 C)   TempSrc:   Oral   SpO2:   100% 95%  Weight:  68 kg    Height:  5\' 9"  (1.753 m)    PainSc: 9        Isolation Precautions No active isolations  Medications Medications  amiodarone (PACERONE) tablet 200 mg (has no administration in time range)  atorvastatin (LIPITOR) tablet 20 mg (has no administration in time range)  furosemide (LASIX) tablet 40 mg (has no administration in time range)  metoprolol succinate (TOPROL-XL) 24 hr tablet 25 mg (has no administration in time range)  levothyroxine (SYNTHROID) tablet 150 mcg (has no administration in time range)  albuterol (PROVENTIL) (2.5 MG/3ML) 0.083% nebulizer solution 2.5 mg (has no administration in time range)  insulin aspart (novoLOG) injection 0-9 Units (has no administration in time range)  oxyCODONE (Oxy IR/ROXICODONE) immediate release tablet 5 mg (has no administration in time range)  HYDROmorphone (DILAUDID) injection 0.5 mg (has no administration in time range)  morphine (PF) 4 MG/ML injection 4 mg (4 mg Intravenous Given 10/11/22 1214)  ondansetron (ZOFRAN) injection 4 mg (4 mg Intravenous Given 10/11/22 1214)  sodium chloride  0.9 % bolus 500 mL (500 mLs Intravenous New Bag/Given 10/11/22 1214)  iohexol (OMNIPAQUE) 350 MG/ML injection 80 mL (80 mLs Intravenous Contrast Given 10/11/22 1314)    Mobility non-ambulatory     Focused Assessments Cardiac Assessment Handoff:    Lab Results  Component Value  Date   CKTOTAL 45 06/02/2013   CKMB 0.8 06/02/2013   TROPONINI <0.03 12/24/2016   No results found for: "DDIMER" Does the Patient currently have chest pain? No    R Recommendations: See Admitting Provider Note  Report given to:   Additional Notes:

## 2022-10-11 NOTE — Telephone Encounter (Signed)
Spoke with patient's spouse and informed her what the DOD recommended as follows:  "I recommend that the patient go to the ED for further evaluation and consultation with vascular surgery if there is concern for acute limb ischemia, as Dr. Kirke Corin is not in the office this week.   Yvonne Kendall, MD [facility]"  Patient's spouse stated that they were on their way to the [facility] ED for further evaluation

## 2022-10-12 ENCOUNTER — Telehealth: Payer: Self-pay

## 2022-10-12 ENCOUNTER — Inpatient Hospital Stay (HOSPITAL_COMMUNITY): Payer: Medicare Other

## 2022-10-12 DIAGNOSIS — I480 Paroxysmal atrial fibrillation: Secondary | ICD-10-CM | POA: Diagnosis not present

## 2022-10-12 DIAGNOSIS — I70235 Atherosclerosis of native arteries of right leg with ulceration of other part of foot: Secondary | ICD-10-CM

## 2022-10-12 DIAGNOSIS — I255 Ischemic cardiomyopathy: Secondary | ICD-10-CM

## 2022-10-12 DIAGNOSIS — I714 Abdominal aortic aneurysm, without rupture, unspecified: Secondary | ICD-10-CM

## 2022-10-12 DIAGNOSIS — I350 Nonrheumatic aortic (valve) stenosis: Secondary | ICD-10-CM

## 2022-10-12 DIAGNOSIS — J431 Panlobular emphysema: Secondary | ICD-10-CM

## 2022-10-12 DIAGNOSIS — E038 Other specified hypothyroidism: Secondary | ICD-10-CM

## 2022-10-12 DIAGNOSIS — Z01810 Encounter for preprocedural cardiovascular examination: Secondary | ICD-10-CM | POA: Diagnosis not present

## 2022-10-12 DIAGNOSIS — N1832 Chronic kidney disease, stage 3b: Secondary | ICD-10-CM

## 2022-10-12 DIAGNOSIS — I998 Other disorder of circulatory system: Secondary | ICD-10-CM | POA: Diagnosis not present

## 2022-10-12 DIAGNOSIS — I10 Essential (primary) hypertension: Secondary | ICD-10-CM

## 2022-10-12 DIAGNOSIS — I2510 Atherosclerotic heart disease of native coronary artery without angina pectoris: Secondary | ICD-10-CM | POA: Diagnosis not present

## 2022-10-12 LAB — PROTIME-INR
INR: 2.8 — ABNORMAL HIGH (ref 0.8–1.2)
Prothrombin Time: 30.1 seconds — ABNORMAL HIGH (ref 11.4–15.2)

## 2022-10-12 LAB — BASIC METABOLIC PANEL
Anion gap: 9 (ref 5–15)
BUN: 19 mg/dL (ref 8–23)
CO2: 26 mmol/L (ref 22–32)
Calcium: 8.1 mg/dL — ABNORMAL LOW (ref 8.9–10.3)
Chloride: 103 mmol/L (ref 98–111)
Creatinine, Ser: 1.51 mg/dL — ABNORMAL HIGH (ref 0.61–1.24)
GFR, Estimated: 46 mL/min — ABNORMAL LOW (ref >60)
Glucose, Bld: 91 mg/dL (ref 70–99)
Potassium: 3.4 mmol/L — ABNORMAL LOW (ref 3.5–5.1)
Sodium: 138 mmol/L (ref 135–145)

## 2022-10-12 LAB — CBC
HCT: 38.6 % — ABNORMAL LOW (ref 39.0–52.0)
Hemoglobin: 11.9 g/dL — ABNORMAL LOW (ref 13.0–17.0)
MCH: 27.1 pg (ref 26.0–34.0)
MCHC: 30.8 g/dL (ref 30.0–36.0)
MCV: 87.9 fL (ref 80.0–100.0)
Platelets: 317 10*3/uL (ref 150–400)
RBC: 4.39 MIL/uL (ref 4.22–5.81)
RDW: 18.5 % — ABNORMAL HIGH (ref 11.5–15.5)
WBC: 8.6 10*3/uL (ref 4.0–10.5)
nRBC: 0 % (ref 0.0–0.2)

## 2022-10-12 LAB — GLUCOSE, CAPILLARY
Glucose-Capillary: 86 mg/dL (ref 70–99)
Glucose-Capillary: 93 mg/dL (ref 70–99)

## 2022-10-12 MED ORDER — VITAMIN K1 10 MG/ML IJ SOLN
1.0000 mg | Freq: Once | INTRAVENOUS | Status: AC
  Administered 2022-10-12: 1 mg via INTRAVENOUS
  Filled 2022-10-12: qty 0.1

## 2022-10-12 NOTE — Assessment & Plan Note (Signed)
Last echo in January 2024 with EF of 40 to 45%.  Euvolemic on exam. - Continue Lasix 40mg  twice daily.  - Continue Toprol-XL 25mg  twice daily. - Home Jardiance on hold in anticipation of possible surgery. - Has not been on ACEi/ARB/ ARNI or MRA due to renal function. - Continue to monitor volume status closely.

## 2022-10-12 NOTE — Assessment & Plan Note (Signed)
Significant history of PAD.History of bilateral ostial common iliac stenting in 2014 and known SFA/ popliteal disease.  CTA showed severe bilateral femoropopliteal PAD with widely patent kissing iliac stents and chronic occlusion of left renal artery stent.  - Management per Vascular Surgery.  -They are planning angiography likely on Friday after improvement of INR

## 2022-10-12 NOTE — Assessment & Plan Note (Signed)
Continue Synthroid °

## 2022-10-12 NOTE — Assessment & Plan Note (Signed)
No acute concern. -As needed bronchodilator

## 2022-10-12 NOTE — Assessment & Plan Note (Signed)
5.5 cm on recent imaging.  Enlarging. Vascular surgery is on board and does not think that he will be a candidate for endovascular repair, likely will require open surgery which is going to be very high risk. -Observe for now

## 2022-10-12 NOTE — Assessment & Plan Note (Signed)
Creatinine at 1.51 today, baseline appears to be around 1.6-1.8. -Monitor renal function -Avoid nephrotoxins

## 2022-10-12 NOTE — Telephone Encounter (Signed)
Pt's wife, [name], reports pt was hospitalized for limb ischemia, right foot, yesterday. Pt is schedule for surgery but physicians are reporting it could be an amputation. She also reports his aortic aneurysm has increased in size, to 5.5 cm.  [name] reports pt is depressed but currently does not want any assistance with this. She has family support with their two sons.  Advised if anything is needed to contact coumadin clinic or office. Cancelled coumadin clinic apt for tomorrow. [name] verbalized understanding.

## 2022-10-12 NOTE — Assessment & Plan Note (Signed)
Blood pressure within goal. -Continue home Lasix and metoprolol

## 2022-10-12 NOTE — Assessment & Plan Note (Signed)
S/p DCCV in 04/2022 with early return in atrial fibrillation. He was started on Amiodarone and then underwent repeat DCCV in 05/2022.  - Maintaining sinus rhythm.  - Continue Toprol-XL 25mg  twice daily. - Continue Amiodarone 200mg  daily.  - On chronic anticoagulation with Coumadin. Currently on hold in anticipation of vascular procedure. Dosing per pharmacy.

## 2022-10-12 NOTE — Assessment & Plan Note (Signed)
History of DVT.  Recent vascular ultrasound was negative for DVT in right lower extremity. -Home Coumadin is under hold for procedure

## 2022-10-12 NOTE — Progress Notes (Signed)
Bilateral lower extremity vein mapping study completed.   Please see CV Procedures for preliminary results.  Milus Fritze, RVT  3:54 PM 10/12/22

## 2022-10-12 NOTE — Progress Notes (Signed)
ANTICOAGULATION CONSULT NOTE  Pharmacy Consult for heparin Indication: critical limb ischemia, hx afib/DVT  Allergies  Allergen Reactions   Plavix [Clopidogrel Bisulfate]     Brain hemorrhage prev while on plavix and aspirin    Patient Measurements: Height: 5\' 9"  (175.3 cm) Weight: 68 kg (149 lb 14.6 oz) IBW/kg (Calculated) : 70.7 Heparin Dosing Weight: 68 kg  Vital Signs: Temp: 97.8 F (36.6 C) (06/19 0750) Temp Source: Oral (06/19 0454) BP: 119/59 (06/19 0750) Pulse Rate: 57 (06/19 0750)  Labs: Recent Labs    10/11/22 1056 10/11/22 1205 10/12/22 0044  HGB 12.4* 13.6 11.9*  HCT 40.7 40.0 38.6*  PLT 336  --  317  LABPROT 27.5*  --  30.1*  INR 2.5*  --  2.8*  CREATININE 1.62* 1.60* 1.51*     Estimated Creatinine Clearance: 36.9 mL/min (A) (by C-G formula based on SCr of 1.51 mg/dL (H)).   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 06/2011 Echo: EF 35-40%; b. 06/2012 Echo: EF 50%; c. 12/2016 Echo: EF 55-60%, no rwma, GRI DD, mild AS/MR, nl RV fxn, nl RVSP; d. 02/2021 Echo: EF 55%, GrII DD. Mild MR. Mild to mod TR. Sev AS by VTI.   AAA (abdominal aortic aneurysm) (HCC)    a. 05/2020 Abd u/s: 3.6cm.   Aortic calcification (HCC) 05/01/2013   Bilateral renal artery stenosis (HCC)    a. 06/2013 Angio/PTA: LRA: 95 (5x18 Herculink stent), RRA 60ost; b. 04/2021 Angio: mod RRA stenosis w/ patent LRA stent.   Carotid arterial disease (HCC)    a. 05/2020 Carotid U/S: RICA 1-39%, LICA 40-59%   CHF (congestive heart failure) (HCC)    Chronic kidney disease    COPD (chronic obstructive pulmonary disease) (HCC)    Coronary artery disease    a. 2013 s/p CABG x 3 (LIMA->LAD, VG->OM, VG->RPDA; b. 06/2012 MV: no ischemia; c. 02/2021 Cath: LM 100, RCA 90ost, 173m, free LIMA->LAD nl, VG->OM2 nl, VG->RPDA nl. Mod AS (mean grad , AVA 1.1cm^2). RHC w/ elev filling pressures.   History of prior cigarette  smoking 04/11/2008   Qualifier: Diagnosis of  By: Ermalene Searing MD, Amy     Hyperlipidemia    Hypertension    Hypothyroidism    Intraventricular hemorrhage (HCC) 07/12/2012   Ischemic cardiomyopathy    a. 06/2011 Echo: EF 35-40%; b. 06/2012 Echo: EF 50%; c. 12/2016 Echo: EF 55-60%, no rwma, GRI DD; d. 02/2021 Echo: Ef 55%, GrII DD.   Moderate aortic stenosis    a. 02/2021 Echo: EF 55%, GrII DD. Sev Ca2+ of AoV. Sev AS w/ AVA by VTI 0.79cm^2; b. 02/2021 Cath: Mod AS w/ mean grad . AVA 1.1cm^2.   PAF (paroxysmal atrial fibrillation) (HCC)    Peripheral arterial disease (HCC)    a. Previous left lower extremity stenting by Dr. Evette Cristal;  b. 12/2012 s/p bilat ostial common iliac stenting; c. 04/2021 Angio: Patent LRA stent, mod RRA stenosis. Small AAA. Sev plaque RSFA 2/ subtl occl of inf branch of profunda. Diff Ca2+ SFA dzs w/ collats from profunda and 3V runoff. Diffuse LSFA dzs w/ collats from profunda. Subtl PT dzs. 3V runoff-->Med Rx.   Right leg DVT (HCC) 08/13/2012   Subclavian artery stenosis, left (HCC) 05/2012   Status post stenting of the ostium and self-expanding stent placement to the left axillary artery   Venous insufficiency     Medications:  Medications Prior to Admission  Medication Sig  Dispense Refill Last Dose   albuterol (VENTOLIN HFA) 108 (90 Base) MCG/ACT inhaler Inhale 2 puffs into the lungs every 6 (six) hours as needed for wheezing or shortness of breath. 18 g 0 Past Month   amiodarone (PACERONE) 200 MG tablet TAKE 1 TABLET BY MOUTH TWICE A DAY 180 tablet 1 10/11/2022   amoxicillin-clavulanate (AUGMENTIN) 875-125 MG tablet Take 1 tablet by mouth 2 (two) times daily.   10/10/2022   atorvastatin (LIPITOR) 20 MG tablet TAKE 1 TABLET BY MOUTH EVERY DAY 30 tablet 5 10/11/2022   augmented betamethasone dipropionate (DIPROLENE-AF) 0.05 % cream Apply 1 application  topically 2 (two) times daily as needed (breakouts).   Past Week   empagliflozin (JARDIANCE) 10 MG TABS tablet Take 1  tablet (10 mg total) by mouth daily before breakfast. 30 tablet 3 10/11/2022   furosemide (LASIX) 40 MG tablet Take 1 tablet (40 mg total) by mouth 2 (two) times daily. 60 tablet 3 10/11/2022   levothyroxine (SYNTHROID) 150 MCG tablet TAKE ONE TABLET BY MOUTH EVERY DAY BEFORE BREAKFAST (Patient taking differently: Take 150 mcg by mouth daily before breakfast. TAKE ONE TABLET BY MOUTH EVERY DAY BEFORE BREAKFAST) 90 tablet 3 10/11/2022   metoprolol succinate (TOPROL XL) 25 MG 24 hr tablet Take 1 tablet (25 mg total) by mouth 2 (two) times daily. 60 tablet 3 10/11/2022 at 8 am   warfarin (COUMADIN) 5 MG tablet Take 2.5 mg by mouth See admin instructions. Take 2.5 mg by mouth once daily except Saturday.   10/10/2022 at 6 pm   Scheduled:   amiodarone  200 mg Oral BID   atorvastatin  20 mg Oral Daily   furosemide  40 mg Oral BID   insulin aspart  0-9 Units Subcutaneous TID WC   levothyroxine  150 mcg Oral Q0600   metoprolol succinate  25 mg Oral BID   Assessment: 82 yom with hx afib/DVT s/p IVC filter on warfarin PTA presenting with critical limb ischemia. Pharmacy consulted to dose heparin when INR <2. Warfarin held. INR 2.5 on presentation. Patient previously on warfarin 2.5 mg daily except no dose on Sat. CBC stable. No bleed issues reported. Continues on PTA amiodarone inpatient.  INR trended up to 2.8 today - Vascular planning to give small dose of vitamin K per note.  Goal of Therapy:  Heparin level 0.3-0.7 units/ml Monitor platelets by anticoagulation protocol: Yes   Plan:  Heparin when INR <2 Daily INR Monitor CBC, signs/symptoms of bleeding Vascular Surgery planning arteriogram Friday and possible revascularization next week   Leia Alf, PharmD, BCPS Please check AMION for all [facility] contact numbers Clinical Pharmacist 10/12/2022 8:55 AM

## 2022-10-12 NOTE — Telephone Encounter (Signed)
I have been following this case through the computer.

## 2022-10-12 NOTE — Assessment & Plan Note (Signed)
Vascular surgery was consulted and patient will be going for angiography on Friday.  History of severe PAD. Recently completed a 10-day course of antibiotics, no active infection. Cardiology clearance requested.  Will be high risk based on HFrEF, severe aortic stenosis and CAD. -Holding off further antibiotics -Continue with pain management

## 2022-10-12 NOTE — TOC Initial Note (Signed)
Transition of Care [facility]) - Initial/Assessment Note    Patient Details  Name: [name] MRN: [id number] Date of Birth: 06-19-1940  [facility]) CM/SW Contact:    Lawerance Sabal, RN Phone Number: 10/12/2022, 10:20 AM  Clinical Narrative:                 Admitted from home w wife for foot pain, vasc consult, ateriogram scheduled for Friday pending INR going down. PTA patient active w [facility]. TOC will continue to follow.   Expected Discharge Plan: Home w Home Health Services Barriers to Discharge: Continued Medical Work up   Patient Goals and CMS Choice            Expected Discharge Plan and Services   Discharge Planning Services: CM Consult Post Acute Care Choice: Home Health Living arrangements for the past 2 months: Single Family Home                             HH Agency: [facility]        Prior Living Arrangements/Services Living arrangements for the past 2 months: Single Family Home Lives with:: Spouse                   Activities of Daily Living Home Assistive Devices/Equipment: Environmental consultant (specify type) ADL Screening (condition at time of admission) Patient's cognitive ability adequate to safely complete daily activities?: Yes Is the patient deaf or have difficulty hearing?: No Does the patient have difficulty seeing, even when wearing glasses/contacts?: No Does the patient have difficulty concentrating, remembering, or making decisions?: Yes Patient able to express need for assistance with ADLs?: Yes Does the patient have difficulty dressing or bathing?: Yes Independently performs ADLs?: Yes (appropriate for developmental age) Does the patient have difficulty walking or climbing stairs?: Yes Weakness of Legs: Both Weakness of Arms/Hands: Both  Permission Sought/Granted                  Emotional Assessment              Admission diagnosis:  Limb ischemia [I99.8] Claudication in peripheral  vascular disease (HCC) [I73.9] Patient Active Problem List   Diagnosis Date Noted   Critical limb ischemia of right lower extremity with ulceration of foot (HCC) 10/11/2022   Limb ischemia 10/11/2022   Heart failure with preserved ejection fraction (HCC) 03/23/2022   History of deep vein thrombosis (DVT) of lower extremity 03/23/2022   Pneumothorax 02/06/2022   Venous stasis ulcer of left ankle limited to breakdown of skin with varicose veins (HCC) 09/22/2021   Aortic valve stenosis    Long term (current) use of anticoagulants 01/27/2017   DVT (deep venous thrombosis) (HCC) 12/29/2016   PAF (paroxysmal atrial fibrillation) (HCC) 05/24/2013   Aortic calcification (HCC) 05/01/2013   CKD (chronic kidney disease) stage 3, GFR 30-59 ml/min (HCC) 05/01/2013   Bilateral renal artery stenosis (HCC)    HTN (hypertension) 07/15/2012   Intraventricular hemorrhage (HCC) 07/12/2012   Peripheral arterial disease (HCC)    Ischemic cardiomyopathy 07/08/2011   CAD (coronary artery disease), native coronary artery 07/07/2011   Subclavian arterial stenosis (HCC) 04/25/2011   BPH (benign prostatic hyperplasia) 12/29/2010   COPD (chronic obstructive pulmonary disease) (HCC) 01/08/2010   Abdominal aortic aneurysm (HCC) 11/06/2009   HYPERCHOLESTEROLEMIA 10/09/2009   History of prior cigarette smoking 04/11/2008   Hypothyroidism 09/07/2006   PCP:  [name], MD Pharmacy:   [facility]  [facility] - [place], [place] - [address] [place] [place] [address] Phone: [phone] Fax: [phone]  [facility] [address] [place] - [address] [place] [address] Phone: [phone] Fax: [phone]     Social Determinants of Health (SDOH) Social History: SDOH Screenings   Food Insecurity: No Food Insecurity (10/11/2022)  Housing: Low Risk  (07/13/2022)  Transportation Needs: No Transportation Needs (07/13/2022)  Utilities: Not At Risk (07/13/2022)  Alcohol  Screen: Low Risk  (07/13/2022)  Depression (PHQ2-9): Low Risk  (07/13/2022)  Financial Resource Strain: Low Risk  (07/13/2022)  Physical Activity: Insufficiently Active (07/13/2022)  Social Connections: Moderately Integrated (07/13/2022)  Stress: No Stress Concern Present (07/13/2022)  Tobacco Use: Medium Risk (10/04/2022)   SDOH Interventions:     Readmission Risk Interventions     No data to display

## 2022-10-12 NOTE — Assessment & Plan Note (Signed)
History of severe aortic stenosis. He is not a candidate for any intervention and palliative approach was recommended

## 2022-10-12 NOTE — Progress Notes (Signed)
Progress Note   Patient: [name]:[id number] DOB: 1941-03-08 DOA: 10/11/2022     1 DOS: the patient was seen and examined on 10/12/2022   Brief hospital course: About 2 weeks ago with gradual worsening.  Taken from H&P.  ROYAL KWIECIEN is a 82 y.o. male with medical history significant of PVD status post left subclavian artery and left axillary artery stenting,, CAD status post CABG, CKD stage IIIb, severe AO, PAF and history of DVT on Coumadin and IVC filter, chronic HFrEF presented with worsening of right foot ulcer pain and discoloration, patient was recently referred to see a vascular surgery and arteriogram was planned for June 28.  In ED he was hemodynamically stable, CTA showed AAA to 5.5 cm and severe bilateral femoral popliteal PVD.  Vascular surgery was consulted.  6/19: Vital stable with heart rate in mid 50s, holding a dose of metoprolol and amiodarone today.  Vascular surgery wants to get cardiac clearance before proceeding due to severe aortic stenosis.  Patient has enlarging abdominal aortic aneurysm and does not appear to be a candidate for endovascular aneurysm repair.  He would require open surgery and will be very high risk given his age and moderate to severe aortic stenosis. Most likely be getting arteriogram on Friday.  INR elevated-vascular surgery ordered vitamin K.  Holding Coumadin.    Assessment and Plan: * Critical limb ischemia of right lower extremity with ulceration of foot [facility]) Vascular surgery was consulted and patient will be going for angiography on Friday.  History of severe PAD. Recently completed a 10-day course of antibiotics, no active infection. Cardiology clearance requested.  Will be high risk based on HFrEF, severe aortic stenosis and CAD. -Holding off further antibiotics -Continue with pain management  Abdominal aortic aneurysm (HCC) 5.5 cm on recent imaging.  Enlarging. Vascular surgery is on board and does not think that he will be a  candidate for endovascular repair, likely will require open surgery which is going to be very high risk. -Observe for now  PAF (paroxysmal atrial fibrillation) (HCC) S/p DCCV in 04/2022 with early return in atrial fibrillation. He was started on Amiodarone and then underwent repeat DCCV in 05/2022.  - Maintaining sinus rhythm.  - Continue Toprol-XL 25mg  twice daily. - Continue Amiodarone 200mg  daily.  - On chronic anticoagulation with Coumadin. Currently on hold in anticipation of vascular procedure. Dosing per pharmacy.  DVT (deep venous thrombosis) (HCC) History of DVT.  Recent vascular ultrasound was negative for DVT in right lower extremity. -Home Coumadin is under hold for procedure  Ischemic cardiomyopathy Last echo in January 2024 with EF of 40 to 45%.  Euvolemic on exam. - Continue Lasix 40mg  twice daily.  - Continue Toprol-XL 25mg  twice daily. - Home Jardiance on hold in anticipation of possible surgery. - Has not been on ACEi/ARB/ ARNI or MRA due to renal function. - Continue to monitor volume status closely.   Peripheral arterial disease (HCC) Significant history of PAD.History of bilateral ostial common iliac stenting in 2014 and known SFA/ popliteal disease.  CTA showed severe bilateral femoropopliteal PAD with widely patent kissing iliac stents and chronic occlusion of left renal artery stent.  - Management per Vascular Surgery.  -They are planning angiography likely on Friday after improvement of INR   Aortic valve stenosis History of severe aortic stenosis. He is not a candidate for any intervention and palliative approach was recommended  CKD (chronic kidney disease) stage 3, GFR 30-59 ml/min (HCC) Creatinine at 1.51 today, baseline  appears to be around 1.6-1.8. -Monitor renal function -Avoid nephrotoxins  HTN (hypertension) Blood pressure within goal. -Continue home Lasix and metoprolol  COPD (chronic obstructive pulmonary disease) (HCC) No acute  concern. -As needed bronchodilator  Hypothyroidism -Continue Synthroid   Subjective: Patient was having 7/10 right foot pain.  No chest pain or shortness of breath.  Physical Exam: Vitals:   10/11/22 1549 10/11/22 2104 10/12/22 0454 10/12/22 0750  BP: (!) 114/45 (!) 113/57 (!) 126/58 (!) 119/59  Pulse: (!) 57 (!) 52 (!) 57 (!) 57  Resp: 18  17 16   Temp: 97.7 F (36.5 C) 97.9 F (36.6 C) 98.4 F (36.9 C) 97.8 F (36.6 C)  TempSrc: Oral Oral Oral   SpO2: 94% 91% 90% (!) 88%  Weight:      Height:       General.  Frail elderly man, in no acute distress. Pulmonary.  Lungs clear bilaterally, normal respiratory effort. CV.  Regular rate and rhythm, no JVD, rub or murmur. Abdomen.  Soft, nontender, nondistended, BS positive. CNS.  Alert and oriented .  No focal neurologic deficit. Extremities.  No edema, right foot warm, bandage in place. Psychiatry.  Judgment and insight appears normal.   Data Reviewed: Prior data reviewed  Family Communication: Discussed with wife at bedside  Disposition: Status is: Inpatient Remains inpatient appropriate because: Severity of illness  Planned Discharge Destination: Home with Home Health  Time spent: 50 minutes  This record has been created using Conservation officer, historic buildings. Errors have been sought and corrected,but may not always be located. Such creation errors do not reflect on the standard of care.   Author: Arnetha Courser, MD 10/12/2022 1:18 PM  For on call review [email or web address].

## 2022-10-12 NOTE — Consult Note (Addendum)
Cardiology Consultation   Patient ID: [name] MRN: [id number]; DOB: [birth year]  Admit date: 10/11/2022 Date of Consult: 10/12/2022  PCP:  [name], MD   [facility] Providers Cardiologist:  Lorine Bears, MD  Electrophysiologist:  Lanier Prude, MD  Advanced Heart Failure:  Arvilla Meres, MD  {  Patient Profile:   [name] is a 82 y.o. male with a history of CAD s/p CABG x3 (LIMA-LAD, SVG-RPDA, SVG-OM) in 2013, ischemic cardiomyopathy/ chronic HFrEF with EF of 40-45% on TEE in 04/2022, paroxysmal atrial fibrillation on Coumadin, moderate to severe aortic stenosis on TEE in 04/2022, PAD s/p bilateral ostial common iliac stenting in 12/2012 and known SFA/ popliteal disease, bilateral renal artery stenosis s/p stenting of left renal artery in 06/2013, bilateral carotid stenosis, left subclavian artery and left axillary artery stenosis s/p stenting in 05/2012, AAA, DVT s/p IVC filter on Coumadin, COPD, hypertension, hyperlipidemia, hypothyroidism, CKD stage III-IV, and intracranial hemorrhage in 2014 (felt to be due to malignant hypertension and DAPT) who is being seen 10/12/2022 for pre-op evaluation for critical limb ischemia at the request of Dr. [name].  History of Present Illness:   [name] is a a 82 year old male with the above history who is followed by Dr. Kirke Corin and Dr. Gala Romney. He has significant history of CAD and PAD involving lower extremities, carotid/ subclavian/ axillary arteries, and renal arteries. He has had multiple interventions including CABG and stenting to bilateral iliac arteries, left subclaivan and axillary arteries, and left renal artery as described above. He also has known AAA. He also has chronic HFrEF, paroxysmal atrial fibrillation, and moderate to severe aortic stenosis. Last ischemic evaluation was a right/left cardiac catheterization in 02/2021 which showed severe native CAD but all three grafts were patent. Also showed  moderately elevated wedge pressure, mild pulmonary hypertension, and mildly reduced cardiac output. His CHF has been felt to be driven by his atrial fibrillation and he underwent TEE/ DCCV in 04/2022 with restoration of sinus rhythm. TEE at that time showed LVEF of 40-45%, normal RV, moderately dilated left atrium, mild MR, and severe calcification of aortic valve with moderate to severe AS with mean gradient of 13.3 mmHg. Unfortunately, he had early return of atrial fibrillation in 10 days. He was started on Amiodarone and then underwent repeat DCCV in 05/2022.  He was seen by Dr. Excell Seltzer on 09/02/2022 for further evaluation of his aortic stenosis at which time he reported a significant declined in his functional status over the last year largely due to leg weakness and gait instability. He denied any chest pain but reported fatigue and dyspnea with activity. He was not felt to be a good candidate for SAVR or TAVR given multiple comorbidities. They had a long discussion on a palliative approach and patient favored this option.   Patient was seen by Cadence Fransico Michael, PA-C, on 10/05/2022 after recent ED visit due to concerns for his podiatrist not being able to palpate a pulse in his right foot. He follows with podiatry for wounds on his right foot. In the ED, dopplers were used and pulses were found so he was discharged home. At visit with Cadence, he reported right foot pain and wounds on between his right toes that would not heal. All this had worsened over the last month. DVT study was ordered and was negative. Patient was discussed with Dr. Kirke Corin who recommended peripheral angiogram.   Peripheral angiogram was scheduled for 10/21/2022. However, he presented to the ED  on 10/11/2022 for worsening right foot pain and concerns for critical limb ischemia. He was seen by Vascular Surgery (Dr. [name]) for evidence of multilevel arterial occlusive disease. Expedited periopheral angiogram recommended but there is concerns  that he will likely need to go to the OR. Therefore, Cardiology was consulted for pre-op evaluation.  At the time of this evaluation, he is resting comfortably in no acute distress. He is stable from a cardiac standpoint. He has had a decline in functional status over the last year but this is due to his significant leg weakness and balance issue. He does described some dyspnea with exertion if he is walking quickly but this sounds like it is due to deconditioning. He denies any orthopnea or PND. He has had edema of right foot with his wound but no other significant lower extremity edema. No chest pain. No palpitations. No near syncope/ syncope.   Past Medical History:  Diagnosis Date   (HFimpEF) heart failure with improved ejection fraction (HCC)    a. 06/2011 Echo: EF 35-40%; b. 06/2012 Echo: EF 50%; c. 12/2016 Echo: EF 55-60%, no rwma, GRI DD, mild AS/MR, nl RV fxn, nl RVSP; d. 02/2021 Echo: EF 55%, GrII DD. Mild MR. Mild to mod TR. Sev AS by VTI.   AAA (abdominal aortic aneurysm) (HCC)    a. 05/2020 Abd u/s: 3.6cm.   Aortic calcification (HCC) 05/01/2013   Bilateral renal artery stenosis (HCC)    a. 06/2013 Angio/PTA: LRA: 95 (5x18 Herculink stent), RRA 60ost; b. 04/2021 Angio: mod RRA stenosis w/ patent LRA stent.   Carotid arterial disease (HCC)    a. 05/2020 Carotid U/S: RICA 1-39%, LICA 40-59%   CHF (congestive heart failure) (HCC)    Chronic kidney disease    COPD (chronic obstructive pulmonary disease) (HCC)    Coronary artery disease    a. 2013 s/p CABG x 3 (LIMA->LAD, VG->OM, VG->RPDA; b. 06/2012 MV: no ischemia; c. 02/2021 Cath: LM 100, RCA 90ost, 151m, free LIMA->LAD nl, VG->OM2 nl, VG->RPDA nl. Mod AS (mean grad , AVA 1.1cm^2). RHC w/ elev filling pressures.   History of prior cigarette smoking 04/11/2008   Qualifier: Diagnosis of  By: Ermalene Searing MD, Amy     Hyperlipidemia    Hypertension    Hypothyroidism    Intraventricular hemorrhage (HCC) 07/12/2012   Ischemic cardiomyopathy     a. 06/2011 Echo: EF 35-40%; b. 06/2012 Echo: EF 50%; c. 12/2016 Echo: EF 55-60%, no rwma, GRI DD; d. 02/2021 Echo: Ef 55%, GrII DD.   Moderate aortic stenosis    a. 02/2021 Echo: EF 55%, GrII DD. Sev Ca2+ of AoV. Sev AS w/ AVA by VTI 0.79cm^2; b. 02/2021 Cath: Mod AS w/ mean grad . AVA 1.1cm^2.   PAF (paroxysmal atrial fibrillation) (HCC)    Peripheral arterial disease (HCC)    a. Previous left lower extremity stenting by Dr. Evette Cristal;  b. 12/2012 s/p bilat ostial common iliac stenting; c. 04/2021 Angio: Patent LRA stent, mod RRA stenosis. Small AAA. Sev plaque RSFA 2/ subtl occl of inf branch of profunda. Diff Ca2+ SFA dzs w/ collats from profunda and 3V runoff. Diffuse LSFA dzs w/ collats from profunda. Subtl PT dzs. 3V runoff-->Med Rx.   Right leg DVT (HCC) 08/13/2012   Subclavian artery stenosis, left (HCC) 05/2012   Status post stenting of the ostium and self-expanding stent placement to the left axillary artery   Venous insufficiency     Past Surgical History:  Procedure Laterality Date   ABDOMINAL ANGIOGRAM  07/10/2013  WITH BI-FEMORAL RUNOFF       DR Kirke Corin   ABDOMINAL AORTAGRAM N/A 12/26/2012   Procedure: ABDOMINAL Ronny Flurry;  Surgeon: [name], MD;  Location: [facility];  Service: Cardiovascular;  Laterality: N/A;   ABDOMINAL AORTAGRAM N/A 07/10/2013   Procedure: ABDOMINAL Ronny Flurry;  Surgeon: [name], MD;  Location: [facility];  Service: Cardiovascular;  Laterality: N/A;   ABDOMINAL AORTOGRAM W/LOWER EXTREMITY N/A 05/12/2021   Procedure: ABDOMINAL AORTOGRAM W/LOWER EXTREMITY;  Surgeon: [name], MD;  Location: [facility];  Service: Cardiovascular;  Laterality: N/A;   ANGIOPLASTY / STENTING FEMORAL  05/2012   ANGIOPLASTY / STENTING ILIAC Bilateral 12/26/2012   ARCH AORTOGRAM N/A 06/20/2012   Procedure: ARCH AORTOGRAM;  Surgeon: [name], MD;  Location: [facility];  Service: Cardiovascular;  Laterality: N/A;   CARDIAC CATHETERIZATION   05/2012   [facility]   CARDIOVERSION N/A 05/23/2022   Procedure: CARDIOVERSION;  Surgeon: Dolores Patty, MD;  Location: [facility] OR[facility];  Service: Cardiovascular;  Laterality: N/A;   CARDIOVERSION N/A 06/14/2022   Procedure: CARDIOVERSION;  Surgeon: Dolores Patty, MD;  Location: [facility] OR[facility];  Service: Cardiovascular;  Laterality: N/A;   CHOLECYSTECTOMY  1990's   CORONARY ARTERY BYPASS GRAFT  07/11/2011   Procedure: CORONARY ARTERY BYPASS GRAFTING (CABG);  Surgeon: [name], MD;  Location: [facility] OR;  Service: Open Heart Surgery;  Laterality: N/A;  Times 3. On Pump. Using right greater saphenous vein and left internal mammary artery.    CORONARY ARTERY BYPASS GRAFT     HERNIA REPAIR     INSERTION OF ILIAC STENT Bilateral 12/26/2012   Procedure: INSERTION OF ILIAC STENT;  Surgeon: [name], MD;  Location: [facility];  Service: Cardiovascular;  Laterality: Bilateral;  Bilateral Common Iliac Artery   LEFT AND RIGHT HEART CATHETERIZATION WITH CORONARY ANGIOGRAM  07/07/2011   Procedure: LEFT AND RIGHT HEART CATHETERIZATION WITH CORONARY ANGIOGRAM;  Surgeon: [name], MD;  Location: [facility];  Service: Cardiovascular;;   LOWER EXTREMITY VENOGRAPHY Bilateral 12/30/2016   Procedure: Lower Extremity Venography;  Surgeon: Renford Dills, MD;  Location: [facility] INVASIVE CV [facility];  Service: Cardiovascular;  Laterality: Bilateral;   PERCUTANEOUS STENT INTERVENTION  06/20/2012   Procedure: PERCUTANEOUS STENT INTERVENTION;  Surgeon: [name], MD;  Location: [facility];  Service: Cardiovascular;;   RENAL ARTERY ANGIOPLASTY Left 07/10/2013   DR ARIDA   RIGHT/LEFT HEART CATH AND CORONARY/GRAFT ANGIOGRAPHY N/A 03/15/2021   Procedure: RIGHT/LEFT HEART CATH AND CORONARY/GRAFT ANGIOGRAPHY;  Surgeon: [name], MD;  Location: [facility] CV [facility];  Service: Cardiovascular;  Laterality: N/A;   SUBCLAVIAN ARTERY STENT  05/2012   "2 stents" (12/26/2012)   TEE WITHOUT CARDIOVERSION N/A  05/23/2022   Procedure: TRANSESOPHAGEAL ECHOCARDIOGRAM (TEE);  Surgeon: Dolores Patty, MD;  Location: [facility] ORS;  Service: Cardiovascular;  Laterality: N/A;   TOOTH EXTRACTION  Spring 2017   mulitple (6)   VENA CAVA FILTER PLACEMENT  07/2012   Removable     Home Medications:  Prior to Admission medications   Medication Sig Start Date End Date Taking? Authorizing Provider  albuterol (VENTOLIN HFA) 108 (90 Base) MCG/ACT inhaler Inhale 2 puffs into the lungs every 6 (six) hours as needed for wheezing or shortness of breath. 03/25/22  Yes Sunnie Nielsen, DO  amiodarone (PACERONE) 200 MG tablet TAKE 1 TABLET BY MOUTH TWICE A DAY 08/29/22  Yes Bensimhon, Bevelyn Buckles, MD  amoxicillin-clavulanate (AUGMENTIN) 875-125 MG tablet Take  1 tablet by mouth 2 (two) times daily. 09/30/22 10/15/22 Yes [provider]  atorvastatin (LIPITOR) 20 MG tablet TAKE 1 TABLET BY MOUTH EVERY DAY 12/24/21  Yes [name], MD  augmented betamethasone dipropionate (DIPROLENE-AF) 0.05 % cream Apply 1 application  topically 2 (two) times daily as needed (breakouts). 03/14/22  Yes [provider]  empagliflozin (JARDIANCE) 10 MG TABS tablet Take 1 tablet (10 mg total) by mouth daily before breakfast. 06/20/22  Yes Sabharwal, Aditya, DO  furosemide (LASIX) 40 MG tablet Take 1 tablet (40 mg total) by mouth 2 (two) times daily. 08/15/22  Yes Copland, Karleen Hampshire, MD  levothyroxine (SYNTHROID) 150 MCG tablet TAKE ONE TABLET BY MOUTH EVERY DAY BEFORE BREAKFAST Patient taking differently: Take 150 mcg by mouth daily before breakfast. TAKE ONE TABLET BY MOUTH EVERY DAY BEFORE BREAKFAST 07/08/22  Yes Copland, Karleen Hampshire, MD  metoprolol succinate (TOPROL XL) 25 MG 24 hr tablet Take 1 tablet (25 mg total) by mouth 2 (two) times daily. 05/16/22  Yes Bensimhon, Bevelyn Buckles, MD  warfarin (COUMADIN) 5 MG tablet Take 2.5 mg by mouth See admin instructions. Take 2.5 mg by mouth once daily except Saturday.   Yes [provider]     Inpatient Medications: Scheduled Meds:  amiodarone  200 mg Oral BID   atorvastatin  20 mg Oral Daily   furosemide  40 mg Oral BID   insulin aspart  0-9 Units Subcutaneous TID WC   levothyroxine  150 mcg Oral Q0600   metoprolol succinate  25 mg Oral BID   Continuous Infusions:   PRN Meds: albuterol, HYDROmorphone (DILAUDID) injection, oxyCODONE  Allergies:    Allergies  Allergen Reactions   Plavix [Clopidogrel Bisulfate]     Brain hemorrhage prev while on plavix and aspirin    Social History:   Social History   Socioeconomic History   Marital status: Married    Spouse name: Not on file   Number of children: 2   Years of education: Not on file   Highest education level: Not on file  Occupational History   Occupation: Retired    Associate Professor: RETIRED    Comment: Comptroller  Tobacco Use   Smoking status: Former    Packs/day: 1.00    Years: 60.00    Additional pack years: 0.00    Total pack years: 60.00    Types: Cigarettes    Quit date: 12/25/2010    Years since quitting: 11.8   Smokeless tobacco: Never  Vaping Use   Vaping Use: Never used  Substance and Sexual Activity   Alcohol use: No   Drug use: No   Sexual activity: Not Currently  Other Topics Concern   Not on file  Social History Narrative   Lives with wife, [name]   Exercising 3 times a week.    Moderate diet control.   2 dogs   Social Determinants of Health   Financial Resource Strain: Low Risk  (07/13/2022)   Overall Financial Resource Strain (CARDIA)    Difficulty of Paying Living Expenses: Not hard at all  Food Insecurity: No Food Insecurity (10/11/2022)   Hunger Vital Sign    Worried About Running Out of Food in the Last Year: Never true    Ran Out of Food in the Last Year: Never true  Transportation Needs: No Transportation Needs (07/13/2022)   PRAPARE - Administrator, Civil Service (Medical): No    Lack of Transportation (Non-Medical): No  Physical Activity:  Insufficiently Active (07/13/2022)  Exercise Vital Sign    Days of Exercise per Week: 7 days    Minutes of Exercise per Session: 20 min  Stress: No Stress Concern Present (07/13/2022)   [employer] of Occupational Health - Occupational Stress Questionnaire    Feeling of Stress : Not at all  Social Connections: Moderately Integrated (07/13/2022)   Social Connection and Isolation Panel [NHANES]    Frequency of Communication with Friends and Family: More than three times a week    Frequency of Social Gatherings with Friends and Family: More than three times a week    Attends Religious Services: Never    Database administrator or Organizations: Yes    Attends Engineer, structural: More than 4 times per year    Marital Status: Married  Catering manager Violence: Not At Risk (07/13/2022)   Humiliation, Afraid, Rape, and Kick questionnaire    Fear of Current or Ex-Partner: No    Emotionally Abused: No    Physically Abused: No    Sexually Abused: No    Family History:   Family History  Problem Relation Age of Onset   Alcohol abuse Father    Cirrhosis Father    Hypothyroidism Sister      ROS:  Please see the history of present illness.  All other ROS reviewed and negative.     Physical Exam/Data:   Vitals:   10/11/22 1549 10/11/22 2104 10/12/22 0454 10/12/22 0750  BP: (!) 114/45 (!) 113/57 (!) 126/58 (!) 119/59  Pulse: (!) 57 (!) 52 (!) 57 (!) 57  Resp: 18  17 16   Temp: 97.7 F (36.5 C) 97.9 F (36.6 C) 98.4 F (36.9 C) 97.8 F (36.6 C)  TempSrc: Oral Oral Oral   SpO2: 94% 91% 90% (!) 88%  Weight:      Height:       No intake or output data in the 24 hours ending 10/12/22 1242    10/11/2022   11:10 AM 10/04/2022    3:31 PM 09/28/2022    5:19 PM  Last 3 Weights  Weight (lbs) 149 lb 14.6 oz 152 lb 150 lb  Weight (kg) 68 kg 68.947 kg 68.04 kg     Body mass index is 22.14 kg/m.  General: 82 y.o. thin Caucasian male resting comfortably in no acute  distress. HEENT: Normocephalic and atraumatic. Sclera clear.  Neck: Supple. No JVD. Heart: Mildly bradycardic with normal rhythm. II/VI systolic murmur. No rubs or gallops.  Lungs: No increased work of breathing. Clear to ausculation bilaterally. No wheezes, rhonchi, or rales.  Abdomen: Soft, non-distended, and non-tender to palpation. Extremities: No lower extremity edema. Right foot wrapped in gauze. Skin: Warm and dry. Neuro: Alert and oriented x3. No focal deficits. Psych: Normal affect. Responds appropriately.   EKG:  No EKG has been performed this admission. Last EKG on 10/04/2022 showed sinus bradycardia, rate 56 bpm, with RBBB and no acute ischemic changes.   Telemetry:  Telemetry was personally reviewed and demonstrates:  Sinus rhythm with rates in the 50s to 60s.  Relevant CV Studies:  TEE 05/23/2022: Impressions: 1. Left ventricular ejection fraction, by estimation, is 40 to 45%. The  left ventricle has mildly decreased function.   2. Right ventricular systolic function is normal. The right ventricular  size is normal.   3. Left atrial size was moderately dilated. No left atrial/left atrial  appendage thrombus was detected.   4. Right atrial size was mildly dilated.   5. The mitral valve is normal  in structure. Mild mitral valve  regurgitation.   6. Visually AS appears severe but by gradient measures in mild to  moderate range. Unable to planimeter AoV orifcie as it was very eccentric.  The aortic valve is tricuspid. There is severe calcifcation of the aortic  valve. Aortic valve regurgitation is  trivial. Moderate to severe aortic valve stenosis. Aortic valve mean  gradient measures 13.3 mmHg. Aortic valve Vmax measures 2.26 m/s.   7. There is Severe (Grade IV) plaque involving the descending aorta.   8. Successful DC-CV   Laboratory Data:  High Sensitivity Troponin:  No results for input(s): "TROPONINIHS" in the last 720 hours.   Chemistry Recent Labs  Lab  10/11/22 1056 10/11/22 1205 10/12/22 0044  NA 137 138 138  K 3.5 3.7 3.4*  CL 100 103 103  CO2 26  --  26  GLUCOSE 89 85 91  BUN 20 24* 19  CREATININE 1.62* 1.60* 1.51*  CALCIUM 8.3*  --  8.1*  GFRNONAA 42*  --  46*  ANIONGAP 11  --  9    No results for input(s): "PROT", "ALBUMIN", "AST", "ALT", "ALKPHOS", "BILITOT" in the last 168 hours. Lipids No results for input(s): "CHOL", "TRIG", "HDL", "LABVLDL", "LDLCALC", "CHOLHDL" in the last 168 hours.  Hematology Recent Labs  Lab 10/11/22 1056 10/11/22 1205 10/12/22 0044  WBC 10.2  --  8.6  RBC 4.71  --  4.39  HGB 12.4* 13.6 11.9*  HCT 40.7 40.0 38.6*  MCV 86.4  --  87.9  MCH 26.3  --  27.1  MCHC 30.5  --  30.8  RDW 18.2*  --  18.5*  PLT 336  --  317   Thyroid No results for input(s): "TSH", "FREET4" in the last 168 hours.  BNPNo results for input(s): "BNP", "PROBNP" in the last 168 hours.  DDimer No results for input(s): "DDIMER" in the last 168 hours.   Radiology/Studies:  CT Angio Aortobifemoral W and/or Wo Contrast  Result Date: 10/11/2022 CLINICAL DATA:  Right lower extremity ischemia, delayed wound healing. Decreased contrast dose administered due to renal function. Repeat imaging EXAM: CT ANGIOGRAPHY OF ABDOMINAL AORTA WITH ILIOFEMORAL RUNOFF TECHNIQUE: Multidetector CT imaging of the abdomen, pelvis and lower extremities was performed using the standard protocol during bolus administration of intravenous contrast. Multiplanar CT image reconstructions and MIPs were obtained to evaluate the vascular anatomy. RADIATION DOSE REDUCTION: This exam was performed according to the departmental dose-optimization program which includes automated exposure control, adjustment of the mA and/or kV according to patient size and/or use of iterative reconstruction technique. CONTRAST:  80mL OMNIPAQUE IOHEXOL 350 MG/ML SOLN COMPARISON:  Prior CT scan of the abdomen and pelvis 02/06/2022 FINDINGS: VASCULAR Aorta: Irregular bilobed infrarenal  abdominal aortic aneurysm measures up to 5.5 x 5.2 cm on the coronal and sagittal reformatted images. There is extensive wall adherent mural thrombus. Celiac: Calcified plaque at the origin of the celiac artery results in mild to moderate focal stenosis. SMA: Calcified plaque in the proximal SMA results in at least mild stenosis. Renals: Solitary renal arteries bilaterally. A renal stent is present in the left renal artery, however the artery appears occluded beyond the terminus of the stent. Calcified plaque results in at least moderate stenosis of the right renal artery. IMA: Diminutive but patent. RIGHT Lower Extremity Inflow: Patent kissing iliac stents. Significant stenosis at the origin of the internal iliac artery. The external iliac artery is heavily calcified but remains patent. Outflow: Bulky calcified atherosclerotic plaque along the posterior  wall of the common femoral artery. At least moderate stenosis at the origins of the duplicated profunda femoral artery. The superficial femoral artery is heavily diseased with critical stenosis proximally. Multifocal moderate stenosis present throughout the mid thigh. The vessel likely occludes completely for short segment in Hunter's canal. The popliteal artery is small in caliber and heavily diseased with bulky calcified atherosclerotic plaque. Runoff: Patent 3 vessel runoff to the ankle. LEFT Lower Extremity Inflow: Widely patent kissing iliac stents. Scattered plaque along the external iliac artery without significant stenosis. Outflow: Bulky plaque along the posterior wall of the common femoral artery without significant stenosis or occlusion. The profunda femoral branches are patent. Critical stenosis bordering on occlusion of the proximal superficial femoral artery. The remainder of the SFA is heavily diseased with bulky calcifications throughout. The artery reconstitutes as it exits Hunter's canal. The popliteal artery is heavily disease with multifocal  bulky calcifications in fibrofatty atherosclerotic plaque. Runoff: Patent 3 vessel runoff to the ankle. Veins: Enlarged right superficial great saphenous vein. Extensive tortuous venous collaterals on the left as well. Findings suggest bilateral superficial venous insufficiency. Review of the MIP images confirms the above findings. NON-VASCULAR Lower chest: Cardiomegaly with extensive atherosclerotic calcifications along the coronary arteries. Diffuse bronchial wall thickening. Centrilobular pulmonary emphysema. Hepatobiliary: No focal liver abnormality is seen. Status post cholecystectomy. No biliary dilatation. Pancreas: Unremarkable. No pancreatic ductal dilatation or surrounding inflammatory changes. Spleen: No splenic injury or perisplenic hematoma. Adrenals/Urinary Tract: Normal adrenal glands. Renal size discrepancy. The left kidney is small relative to the right likely due to significant stenosis of the left renal artery beyond the previously placed renal artery stent. Multiple circumscribed water attenuation simple cysts along the right kidney. No imaging follow-up is recommended. The ureters are unremarkable. The bladder is distended. Stomach/Bowel: No focal bowel wall thickening or evidence of obstruction. Lymphatic: No suspicious lymphadenopathy. Reproductive: Mild prostatomegaly. Other: No abdominal wall hernia or abnormality. No abdominopelvic ascites. Musculoskeletal: Straightening of the normal lumbar lordosis. No acute fracture or malalignment. Multilevel degenerative changes. Mild bilateral hip joint degenerative osteoarthritis. IMPRESSION: VASCULAR 1. Irregular bilobed infrarenal abdominal aortic aneurysm measuring up to 5.5 cm. Recommend referral to a vascular specialist. This recommendation follows ACR consensus guidelines: White Paper of the ACR Incidental Findings Committee II on Vascular Findings. J Am Coll Radiol 2013; 10:789-794. 2. Severe bilateral femoropopliteal peripheral arterial  disease as described above. 3. Large tortuous venous varicosities and great saphenous veins suggests chronic venous insufficiency as well. This can also be a source for chronic nonhealing lower extremity wounds. 4. Widely patent kissing iliac stents. 5. Chronic occlusion of left renal artery stent. 6. Celiac and SMA artery origins stenoses. NON-VASCULAR 1. Cardiomegaly with coronary artery disease. 2. Centrilobular pulmonary emphysema. 3. Distended bladder suggests the possibility of bladder outlet obstruction from BPH. 4. Multilevel degenerative disc disease with straightening of the normal lumbar lordosis. 5. Bilateral hip joint degenerative osteoarthritis. Aortic aneurysm NOS (ICD10-I71.9); Aortic Atherosclerosis (ICD10-I70.0) and Emphysema (ICD10-J43.9). Electronically Signed   By: Malachy Moan M.D.   On: 10/11/2022 13:58     Assessment and Plan:   Pre-Op Evaluation Patient presents with critical limb ischemia. Vascular Surgery has seen and is planning on peripheral angiogram but there is concerns that he will likely need to go to the OR. He has known CAD s/p CABG, CHF, and moderate to severe aortic stenosis. Palliative approach was recently recommended for treatment of his aortic stenosis. He also has a known AAA measuring 5.5 cm. Therefore, he is high risk  for surgery. However, he is well optimized from a cardiac standpoint and there is nothing that we can do that will lower his risk. No additional cardiac work-up necessary.  CAD s/p CABG History of CAD s/p CABG x3 in 2013. Last cardiac catheterization in 02/2021 showed severe native CAD but all grafts were patent.  - No chest pain.  - No aspirin due to full dose anticoagulation. - Continue statin.   Chronic HFrEF Last TEE in 04/2022 showed LVEF of 40-45%.  - Euvolemic on exam.  - Continue Lasix 40mg  twice daily.  - Continue Toprol-XL 25mg  twice daily. - Home Jardiance on hold in anticipation of possible surgery. - Has not been on  ACEi/ARB/ ARNI or MRA due to renal function. - Continue to monitor volume status closely.  Persistent Atrial Fibrillation S/p DCCV in 04/2022 with early return in atrial fibrillation. He was started on Amiodarone and then underwent repeat DCCV in 05/2022.  - Maintaining sinus rhythm.  - Continue Toprol-XL 25mg  twice daily. - Continue Amiodarone 200mg  daily.  - On chronic anticoagulation with Coumadin. Currently on hold in anticipation of vascular procedure. Dosing per pharmacy.  Moderate to Severe Aortic Stenosis TEE in 04/2022 showed severe calcification of aortic valve with moderate to severe AS with mean gradient of 13.3 mmHg. He was seen by Dr. Excell Seltzer in 08/2022 and palliative approach was recommended.   Critical Limb Ischemia  PAD History of bilateral ostial common iliac stenting in 2014 and known SFA/ popliteal disease. He has wounds on his right foot that will not heal. He presented with worsening right foot pain and concerns of critical limb ischemia. CTA showed severe bilateral femoropopliteal PAD with widely patent kissing iliac stents and chronic occlusion of left renal artery stent.  - Management per Vascular Surgery. Plan is for peripheral angiogram later this week after INR comes down.  Bilateral Carotid Stenosis Left Subclavian and Axillary Stenosis s/p Stenting S/p stenting of left subclavian and axillary arteries in 2014. Last carotid ultrasound in 06/2022 showed 1-39% of right ICA and 40-59% of left ICA as well as a stenotic left subclavian artery, disturbed flow of right subclavian artery, and retrograde flow through right vertebral artery. - Continue to follow as an outpatient.  AAA CTA on admission showed irregular bilobed infrarenal AAA measuring 5.5cm. - Per Vascular Surgery, patient does not appear to be a candidate for an endovascular aneurysm repair. He would require open repair but this would be high risk.  CKD Stage III-IV Baseline creatinine around 1.6 to 2.0. -  Creatinine stable at 1.51 today. - Continue to monitor.     Risk Assessment/Risk Scores:    New York Heart Association (NYHA) Functional Class NYHA Class III. Functional status is mainly limited by leg weakness and balance issues.   CHA2DS2-VASc Score = 7  This indicates a 11.2% annual risk of stroke. The patient's score is based upon: CHF History: 1 HTN History: 1 Diabetes History: 0 Stroke History: 2 (VTE - history of DVT) Vascular Disease History: 1 Age Score: 2 Gender Score: 0    For questions or updates, please contact [facility] Please consult [email or web address] for contact info under    Signed, Corrin Parker, PA-C  10/12/2022 12:42 PM  I have personally seen and examined this patient. I agree with the assessment and plan as outlined above. 82 yo male with multiple advanced medical issues including CAD s/p 3V CABG, ischemic cardiomyopathy, chronic systolic CHF, PAF on coumadin, moderate to severe AS on TEE in 04/2022,  PAD s/p bilateral ostial common iliac stenting in 12/2012 and known SFA/ popliteal disease, bilateral renal artery stenosis s/p stenting of left renal artery in 06/2013, bilateral carotid stenosis, left subclavian artery and left axillary artery stenosis s/p stenting in 05/2012, AAA, DVT s/p IVC filter on Coumadin, COPD, HLD, HTN, hypothyroidism, CKD stage 3-4, IC hemorrhage admitted with critical limb ischemia. He has been seen by [facility]. Appreciate Dr. Adele Dan care. Planning for aortogram with LE runoff this week. Cardiology asked to see him to review his cardiac risk. Labs reviewed by me.  EKG not available-pending My exam: Lungs clear. CV:RRR with systolic murmur. Abd: soft. Ext: no edema. Dressing on right foot  Plan: He is high risk for any surgical procedure given his extensive cardiovascular history but he seems to be well optimized from a cardiac standpoint. No angina, CHF or Vent arrhythmias. He is not felt to be a TAVR candidate. I would proceed  with his planned vascular procedures without further cardiac workup. I will follow up on his EKG later.   Verne Carrow, MD, [facility] 10/12/2022 1:04 PM

## 2022-10-12 NOTE — Progress Notes (Signed)
  VASCULAR SURGERY ASSESSMENT & PLAN:   CRITICAL LIMB ISCHEMIA RIGHT LOWER EXTREMITY: The wound on his right foot is stable.  He has multilevel arterial occlusive disease.  I plan on an arteriogram Friday and possible revascularization next week.  He will likely require a femoral to below-knee popliteal artery bypass.  He has diffuse calcific disease.  I want to be sure that he does not have any significant inflow disease.  Given his moderate to severe aortic stenosis he will need preoperative cardiac evaluation.  ANTICOAGULATION: INR = 2.8 today. I am going to tentatively schedule him for an arteriogram on Friday.  Please hold his Coumadin.  Heparin can be started once his INR is down.  I will give him a small dose of vitamin K today (1mg ).   ABDOMINAL AORTIC ANEURYSM: The patient's abdominal aortic aneurysm is now enlarged to 5.5 cm.  The patient does not appear to be a candidate for an endovascular aneurysm repair.  He would require open repair most likely would be high risk given his age and moderate to severe aortic stenosis.  In addition currently the foot is the critical issue.  SUBJECTIVE:   No specific complaints this morning.  PHYSICAL EXAM:   Vitals:   10/11/22 1245 10/11/22 1549 10/11/22 2104 10/12/22 0454  BP: 129/60 (!) 114/45 (!) 113/57 (!) 126/58  Pulse: (!) 58 (!) 57 (!) 52 (!) 57  Resp: 16 18  17   Temp:  97.7 F (36.5 C) 97.9 F (36.6 C) 98.4 F (36.9 C)  TempSrc:  Oral Oral Oral  SpO2: 95% 94% 91% 90%  Weight:      Height:       His right foot is unchanged.  LABS:   Lab Results  Component Value Date   WBC 8.6 10/12/2022   HGB 11.9 (L) 10/12/2022   HCT 38.6 (L) 10/12/2022   MCV 87.9 10/12/2022   PLT 317 10/12/2022   Lab Results  Component Value Date   CREATININE 1.51 (H) 10/12/2022   Lab Results  Component Value Date   INR 2.8 (H) 10/12/2022   CBG (last 3)  Recent Labs    10/11/22 1657 10/11/22 1742 10/12/22 0006  GLUCAP 119* 120* 86     PROBLEM LIST:    Principal Problem:   Limb ischemia Active Problems:   Abdominal aortic aneurysm (HCC)   Peripheral arterial disease (HCC)   PAF (paroxysmal atrial fibrillation) (HCC)   Critical limb ischemia of right lower extremity with ulceration of foot (HCC)   CURRENT MEDS:    amiodarone  200 mg Oral BID   atorvastatin  20 mg Oral Daily   furosemide  40 mg Oral BID   insulin aspart  0-9 Units Subcutaneous TID WC   levothyroxine  150 mcg Oral Q0600   metoprolol succinate  25 mg Oral BID    Waverly Ferrari Office: [phone] 10/12/2022

## 2022-10-12 NOTE — Hospital Course (Signed)
About 2 weeks ago with gradual worsening.  Taken from H&P.  [name] is a 82 y.o. male with medical history significant of PVD status post left subclavian artery and left axillary artery stenting,, CAD status post CABG, CKD stage IIIb, severe AO, PAF and history of DVT on Coumadin and IVC filter, chronic HFrEF presented with worsening of right foot ulcer pain and discoloration, patient was recently referred to see a vascular surgery and arteriogram was planned for June 28.  In ED he was hemodynamically stable, CTA showed AAA to 5.5 cm and severe bilateral femoral popliteal PVD.  Vascular surgery was consulted.  6/19: Vital stable with heart rate in mid 50s, holding a dose of metoprolol and amiodarone today.  Vascular surgery wants to get cardiac clearance before proceeding due to severe aortic stenosis.  Patient has enlarging abdominal aortic aneurysm and does not appear to be a candidate for endovascular aneurysm repair.  He would require open surgery and will be very high risk given his age and moderate to severe aortic stenosis. Most likely be getting arteriogram on Friday.  INR elevated-vascular surgery ordered vitamin K.  Holding Coumadin.  6/20: Vitals with heart rate in mid 50s, blood pressure within low normal range, slight worsening of creatinine to 1.72, leukocytosis at 11.1.  Starting on Zosyn as toe is becoming more gangrenous with surrounding erythema and edema.  Holding amiodarone, metoprolol and Lasix today.  Going for aortogram and possible intervention with vascular surgery tomorrow.

## 2022-10-13 ENCOUNTER — Ambulatory Visit: Payer: Medicare Other

## 2022-10-13 LAB — CBC
HCT: 38.7 % — ABNORMAL LOW (ref 39.0–52.0)
Hemoglobin: 11.6 g/dL — ABNORMAL LOW (ref 13.0–17.0)
MCH: 26 pg (ref 26.0–34.0)
MCHC: 30 g/dL (ref 30.0–36.0)
MCV: 86.8 fL (ref 80.0–100.0)
Platelets: 351 10*3/uL (ref 150–400)
RBC: 4.46 MIL/uL (ref 4.22–5.81)
RDW: 18.2 % — ABNORMAL HIGH (ref 11.5–15.5)
WBC: 11.1 10*3/uL — ABNORMAL HIGH (ref 4.0–10.5)
nRBC: 0 % (ref 0.0–0.2)

## 2022-10-13 LAB — PROTIME-INR
INR: 1.8 — ABNORMAL HIGH (ref 0.8–1.2)
Prothrombin Time: 21.3 seconds — ABNORMAL HIGH (ref 11.4–15.2)

## 2022-10-13 LAB — BASIC METABOLIC PANEL
Anion gap: 8 (ref 5–15)
BUN: 20 mg/dL (ref 8–23)
CO2: 25 mmol/L (ref 22–32)
Calcium: 8 mg/dL — ABNORMAL LOW (ref 8.9–10.3)
Chloride: 101 mmol/L (ref 98–111)
Creatinine, Ser: 1.72 mg/dL — ABNORMAL HIGH (ref 0.61–1.24)
GFR, Estimated: 39 mL/min — ABNORMAL LOW (ref >60)
Glucose, Bld: 105 mg/dL — ABNORMAL HIGH (ref 70–99)
Potassium: 3.4 mmol/L — ABNORMAL LOW (ref 3.5–5.1)
Sodium: 134 mmol/L — ABNORMAL LOW (ref 135–145)

## 2022-10-13 LAB — HEPARIN LEVEL (UNFRACTIONATED): Heparin Unfractionated: <0.1 IU/mL — ABNORMAL LOW (ref 0.30–0.70)

## 2022-10-13 MED ORDER — POTASSIUM CHLORIDE 20 MEQ PO PACK
20.0000 meq | PACK | Freq: Once | ORAL | Status: AC
  Administered 2022-10-13: 20 meq via ORAL
  Filled 2022-10-13: qty 1

## 2022-10-13 MED ORDER — HEPARIN (PORCINE) 25000 UT/250ML-% IV SOLN
1500.0000 [IU]/h | INTRAVENOUS | Status: DC
  Administered 2022-10-14: 1500 [IU]/h via INTRAVENOUS
  Filled 2022-10-13: qty 250

## 2022-10-13 MED ORDER — PIPERACILLIN-TAZOBACTAM 3.375 G IVPB
3.3750 g | Freq: Three times a day (TID) | INTRAVENOUS | Status: AC
  Administered 2022-10-13 – 2022-10-27 (×42): 3.375 g via INTRAVENOUS
  Filled 2022-10-13 (×43): qty 50

## 2022-10-13 MED ORDER — HEPARIN (PORCINE) 25000 UT/250ML-% IV SOLN
1050.0000 [IU]/h | INTRAVENOUS | Status: DC
  Administered 2022-10-13: 1050 [IU]/h via INTRAVENOUS
  Filled 2022-10-13: qty 250

## 2022-10-13 MED ORDER — PIPERACILLIN-TAZOBACTAM 3.375 G IVPB 30 MIN
3.3750 g | Freq: Once | INTRAVENOUS | Status: AC
  Administered 2022-10-13: 3.375 g via INTRAVENOUS
  Filled 2022-10-13 (×2): qty 50

## 2022-10-13 NOTE — Progress Notes (Addendum)
ANTICOAGULATION CONSULT NOTE  Pharmacy Consult for heparin Indication: critical limb ischemia, hx afib/DVT  Allergies  Allergen Reactions   Plavix [Clopidogrel Bisulfate]     Brain hemorrhage prev while on plavix and aspirin    Patient Measurements: Height: 5\' 9"  (175.3 cm) Weight: 68 kg (149 lb 14.6 oz) IBW/kg (Calculated) : 70.7 Heparin Dosing Weight: 68 kg  Vital Signs: Temp: 98.2 F (36.8 C) (06/20 0513) Temp Source: Oral (06/20 0513) BP: 111/65 (06/20 0800) Pulse Rate: 55 (06/20 0800)  Labs: Recent Labs    10/11/22 1056 10/11/22 1205 10/12/22 0044 10/13/22 0011  HGB 12.4* 13.6 11.9* 11.6*  HCT 40.7 40.0 38.6* 38.7*  PLT 336  --  317 351  LABPROT 27.5*  --  30.1* 21.3*  INR 2.5*  --  2.8* 1.8*  CREATININE 1.62* 1.60* 1.51* 1.72*     Estimated Creatinine Clearance: 32.4 mL/min (A) (by C-G formula based on SCr of 1.72 mg/dL (H)).   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 06/2011 Echo: EF 35-40%; b. 06/2012 Echo: EF 50%; c. 12/2016 Echo: EF 55-60%, no rwma, GRI DD, mild AS/MR, nl RV fxn, nl RVSP; d. 02/2021 Echo: EF 55%, GrII DD. Mild MR. Mild to mod TR. Sev AS by VTI.   AAA (abdominal aortic aneurysm) (HCC)    a. 05/2020 Abd u/s: 3.6cm.   Aortic calcification (HCC) 05/01/2013   Bilateral renal artery stenosis (HCC)    a. 06/2013 Angio/PTA: LRA: 95 (5x18 Herculink stent), RRA 60ost; b. 04/2021 Angio: mod RRA stenosis w/ patent LRA stent.   Carotid arterial disease (HCC)    a. 05/2020 Carotid U/S: RICA 1-39%, LICA 40-59%   CHF (congestive heart failure) (HCC)    Chronic kidney disease    COPD (chronic obstructive pulmonary disease) (HCC)    Coronary artery disease    a. 2013 s/p CABG x 3 (LIMA->LAD, VG->OM, VG->RPDA; b. 06/2012 MV: no ischemia; c. 02/2021 Cath: LM 100, RCA 90ost, 133m, free LIMA->LAD nl, VG->OM2 nl, VG->RPDA nl. Mod AS (mean grad , AVA 1.1cm^2). RHC w/ elev  filling pressures.   History of prior cigarette smoking 04/11/2008   Qualifier: Diagnosis of  By: Ermalene Searing MD, Amy     Hyperlipidemia    Hypertension    Hypothyroidism    Intraventricular hemorrhage (HCC) 07/12/2012   Ischemic cardiomyopathy    a. 06/2011 Echo: EF 35-40%; b. 06/2012 Echo: EF 50%; c. 12/2016 Echo: EF 55-60%, no rwma, GRI DD; d. 02/2021 Echo: Ef 55%, GrII DD.   Moderate aortic stenosis    a. 02/2021 Echo: EF 55%, GrII DD. Sev Ca2+ of AoV. Sev AS w/ AVA by VTI 0.79cm^2; b. 02/2021 Cath: Mod AS w/ mean grad . AVA 1.1cm^2.   PAF (paroxysmal atrial fibrillation) (HCC)    Peripheral arterial disease (HCC)    a. Previous left lower extremity stenting by Dr. Evette Cristal;  b. 12/2012 s/p bilat ostial common iliac stenting; c. 04/2021 Angio: Patent LRA stent, mod RRA stenosis. Small AAA. Sev plaque RSFA 2/ subtl occl of inf branch of profunda. Diff Ca2+ SFA dzs w/ collats from profunda and 3V runoff. Diffuse LSFA dzs w/ collats from profunda. Subtl PT dzs. 3V runoff-->Med Rx.   Right leg DVT (HCC) 08/13/2012   Subclavian artery stenosis, left (HCC) 05/2012   Status post stenting of the ostium and self-expanding stent placement to the left axillary artery   Venous insufficiency     Medications:  Medications Prior to Admission  Medication Sig Dispense Refill Last Dose   albuterol (VENTOLIN HFA) 108 (90 Base) MCG/ACT inhaler Inhale 2 puffs into the lungs every 6 (six) hours as needed for wheezing or shortness of breath. 18 g 0 Past Month   amiodarone (PACERONE) 200 MG tablet TAKE 1 TABLET BY MOUTH TWICE A DAY 180 tablet 1 10/11/2022   amoxicillin-clavulanate (AUGMENTIN) 875-125 MG tablet Take 1 tablet by mouth 2 (two) times daily.   10/10/2022   atorvastatin (LIPITOR) 20 MG tablet TAKE 1 TABLET BY MOUTH EVERY DAY 30 tablet 5 10/11/2022   augmented betamethasone dipropionate (DIPROLENE-AF) 0.05 % cream Apply 1 application  topically 2 (two) times daily as needed (breakouts).   Past Week    empagliflozin (JARDIANCE) 10 MG TABS tablet Take 1 tablet (10 mg total) by mouth daily before breakfast. 30 tablet 3 10/11/2022   furosemide (LASIX) 40 MG tablet Take 1 tablet (40 mg total) by mouth 2 (two) times daily. 60 tablet 3 10/11/2022   levothyroxine (SYNTHROID) 150 MCG tablet TAKE ONE TABLET BY MOUTH EVERY DAY BEFORE BREAKFAST (Patient taking differently: Take 150 mcg by mouth daily before breakfast. TAKE ONE TABLET BY MOUTH EVERY DAY BEFORE BREAKFAST) 90 tablet 3 10/11/2022   metoprolol succinate (TOPROL XL) 25 MG 24 hr tablet Take 1 tablet (25 mg total) by mouth 2 (two) times daily. 60 tablet 3 10/11/2022 at 8 am   warfarin (COUMADIN) 5 MG tablet Take 2.5 mg by mouth See admin instructions. Take 2.5 mg by mouth once daily except Saturday.   10/10/2022 at 6 pm   Scheduled:   amiodarone  200 mg Oral BID   atorvastatin  20 mg Oral Daily   furosemide  40 mg Oral BID   insulin aspart  0-9 Units Subcutaneous TID WC   levothyroxine  150 mcg Oral Q0600   metoprolol succinate  25 mg Oral BID   potassium chloride  20 mEq Oral Once   Assessment: 82 yom with hx afib/DVT s/p IVC filter on warfarin PTA presenting with critical limb ischemia. Pharmacy consulted to dose heparin when INR <2. Warfarin held. INR 2.5 on presentation. Patient previously on warfarin 2.5 mg daily except no dose on Sat. CBC stable. No bleed issues reported. Continues on PTA amiodarone inpatient.  INR down to 1.8 s/p vitamin K 1mg  IV x 1 on 6/19. CBC stable. No bleed issues reported. Noted SCr trend up a bit but still within baseline.  Goal of Therapy:  Heparin level 0.3-0.7 units/ml Monitor platelets by anticoagulation protocol: Yes   Plan:  No bolus. Start heparin infusion at 1050 units/hr Check 8hr heparin level Monitor daily CBC, s/sx bleeding Vascular Surgery planning arteriogram Friday and possible revascularization next week F/u warfarin resumption once procedures complete   Leia Alf, PharmD, BCPS Please  check AMION for all [facility] contact numbers Clinical Pharmacist 10/13/2022 8:26 AM

## 2022-10-13 NOTE — Progress Notes (Signed)
ANTICOAGULATION CONSULT NOTE  Pharmacy Consult for heparin Indication: critical limb ischemia, hx afib/DVT  Allergies  Allergen Reactions   Plavix [Clopidogrel Bisulfate]     Brain hemorrhage prev while on plavix and aspirin    Patient Measurements: Height: 5\' 9"  (175.3 cm) Weight: 68 kg (149 lb 14.6 oz) IBW/kg (Calculated) : 70.7 Heparin Dosing Weight: 68 kg  Vital Signs: Temp: 98.2 F (36.8 C) (06/20 1505) Temp Source: Oral (06/20 1505) BP: 129/61 (06/20 1505) Pulse Rate: 57 (06/20 1505)  Labs: Recent Labs    10/11/22 1056 10/11/22 1205 10/12/22 0044 10/13/22 0011 10/13/22 1652  HGB 12.4* 13.6 11.9* 11.6*  --   HCT 40.7 40.0 38.6* 38.7*  --   PLT 336  --  317 351  --   LABPROT 27.5*  --  30.1* 21.3*  --   INR 2.5*  --  2.8* 1.8*  --   HEPARINUNFRC  --   --   --   --  <0.10*  CREATININE 1.62* 1.60* 1.51* 1.72*  --      Estimated Creatinine Clearance: 32.4 mL/min (A) (by C-G formula based on SCr of 1.72 mg/dL (H)).   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 06/2011 Echo: EF 35-40%; b. 06/2012 Echo: EF 50%; c. 12/2016 Echo: EF 55-60%, no rwma, GRI DD, mild AS/MR, nl RV fxn, nl RVSP; d. 02/2021 Echo: EF 55%, GrII DD. Mild MR. Mild to mod TR. Sev AS by VTI.   AAA (abdominal aortic aneurysm) (HCC)    a. 05/2020 Abd u/s: 3.6cm.   Aortic calcification (HCC) 05/01/2013   Bilateral renal artery stenosis (HCC)    a. 06/2013 Angio/PTA: LRA: 95 (5x18 Herculink stent), RRA 60ost; b. 04/2021 Angio: mod RRA stenosis w/ patent LRA stent.   Carotid arterial disease (HCC)    a. 05/2020 Carotid U/S: RICA 1-39%, LICA 40-59%   CHF (congestive heart failure) (HCC)    Chronic kidney disease    COPD (chronic obstructive pulmonary disease) (HCC)    Coronary artery disease    a. 2013 s/p CABG x 3 (LIMA->LAD, VG->OM, VG->RPDA; b. 06/2012 MV: no ischemia; c. 02/2021 Cath: LM 100, RCA 90ost, 147m, free  LIMA->LAD nl, VG->OM2 nl, VG->RPDA nl. Mod AS (mean grad , AVA 1.1cm^2). RHC w/ elev filling pressures.   History of prior cigarette smoking 04/11/2008   Qualifier: Diagnosis of  By: Ermalene Searing MD, Amy     Hyperlipidemia    Hypertension    Hypothyroidism    Intraventricular hemorrhage (HCC) 07/12/2012   Ischemic cardiomyopathy    a. 06/2011 Echo: EF 35-40%; b. 06/2012 Echo: EF 50%; c. 12/2016 Echo: EF 55-60%, no rwma, GRI DD; d. 02/2021 Echo: Ef 55%, GrII DD.   Moderate aortic stenosis    a. 02/2021 Echo: EF 55%, GrII DD. Sev Ca2+ of AoV. Sev AS w/ AVA by VTI 0.79cm^2; b. 02/2021 Cath: Mod AS w/ mean grad . AVA 1.1cm^2.   PAF (paroxysmal atrial fibrillation) (HCC)    Peripheral arterial disease (HCC)    a. Previous left lower extremity stenting by Dr. Evette Cristal;  b. 12/2012 s/p bilat ostial common iliac stenting; c. 04/2021 Angio: Patent LRA stent, mod RRA stenosis. Small AAA. Sev plaque RSFA 2/ subtl occl of inf branch of profunda. Diff Ca2+ SFA dzs w/ collats from profunda and 3V runoff. Diffuse LSFA dzs w/ collats from profunda. Subtl PT dzs. 3V runoff-->Med Rx.   Right leg DVT (HCC)  08/13/2012   Subclavian artery stenosis, left (HCC) 05/2012   Status post stenting of the ostium and self-expanding stent placement to the left axillary artery   Venous insufficiency     Medications:  Medications Prior to Admission  Medication Sig Dispense Refill Last Dose   albuterol (VENTOLIN HFA) 108 (90 Base) MCG/ACT inhaler Inhale 2 puffs into the lungs every 6 (six) hours as needed for wheezing or shortness of breath. 18 g 0 Past Month   amiodarone (PACERONE) 200 MG tablet TAKE 1 TABLET BY MOUTH TWICE A DAY 180 tablet 1 10/11/2022   amoxicillin-clavulanate (AUGMENTIN) 875-125 MG tablet Take 1 tablet by mouth 2 (two) times daily.   10/10/2022   atorvastatin (LIPITOR) 20 MG tablet TAKE 1 TABLET BY MOUTH EVERY DAY 30 tablet 5 10/11/2022   augmented betamethasone dipropionate (DIPROLENE-AF) 0.05 % cream  Apply 1 application  topically 2 (two) times daily as needed (breakouts).   Past Week   empagliflozin (JARDIANCE) 10 MG TABS tablet Take 1 tablet (10 mg total) by mouth daily before breakfast. 30 tablet 3 10/11/2022   furosemide (LASIX) 40 MG tablet Take 1 tablet (40 mg total) by mouth 2 (two) times daily. 60 tablet 3 10/11/2022   levothyroxine (SYNTHROID) 150 MCG tablet TAKE ONE TABLET BY MOUTH EVERY DAY BEFORE BREAKFAST (Patient taking differently: Take 150 mcg by mouth daily before breakfast. TAKE ONE TABLET BY MOUTH EVERY DAY BEFORE BREAKFAST) 90 tablet 3 10/11/2022   metoprolol succinate (TOPROL XL) 25 MG 24 hr tablet Take 1 tablet (25 mg total) by mouth 2 (two) times daily. 60 tablet 3 10/11/2022 at 8 am   warfarin (COUMADIN) 5 MG tablet Take 2.5 mg by mouth See admin instructions. Take 2.5 mg by mouth once daily except Saturday.   10/10/2022 at 6 pm   Scheduled:   amiodarone  200 mg Oral BID   atorvastatin  20 mg Oral Daily   furosemide  40 mg Oral BID   levothyroxine  150 mcg Oral Q0600   metoprolol succinate  25 mg Oral BID   Assessment: 82 yom with hx afib/DVT s/p IVC filter on warfarin PTA presenting with critical limb ischemia. Pharmacy consulted to dose heparin when INR <2. Warfarin held. INR 2.5 on presentation. Patient previously on warfarin 2.5 mg daily except no dose on Sat. CBC stable. No bleed issues reported. Continues on PTA amiodarone inpatient.  INR 1.8 with slightly bump in Scr to 1.72  Heparin level <0.1 units/mL on heparin 1050 units/hr. No issues with infusion per RN.  Goal of Therapy:  Heparin level 0.3-0.7 units/ml Monitor platelets by anticoagulation protocol: Yes   Plan:  Increase heparin to 1300 units/hr without bolus Check 8hr heparin level Monitor daily CBC, s/sx bleeding Vascular Surgery planning arteriogram Friday and possible revascularization next week F/u warfarin resumption once procedures complete  Eldridge Scot, PharmD Clinical Pharmacist 10/13/2022  5:27 PM

## 2022-10-13 NOTE — Assessment & Plan Note (Addendum)
Vascular surgery was consulted and patient will be going for angiography on Friday.  History of severe PAD. Recently completed a 10-day course of antibiotics, initially no antibiotics were given but now developed leukocytosis with worsening edema and erythema surrounding gangrenous toe. Cardiology clearance requested.  Will be high risk based on HFrEF, severe aortic stenosis and CAD. -Vascular surgery following -Plan for aortogram and potential intervention today -Continue Zosyn (started 6/20) -Continue with pain management

## 2022-10-13 NOTE — Progress Notes (Signed)
Progress Note   Patient: [name] VHQ:[id number] DOB: 05-Jul-1940 DOA: 10/11/2022     2 DOS: the patient was seen and examined on 10/13/2022   Brief hospital course: About 2 weeks ago with gradual worsening.  Taken from H&P.  ALRIC CAPLEY is a 82 y.o. male with medical history significant of PVD status post left subclavian artery and left axillary artery stenting,, CAD status post CABG, CKD stage IIIb, severe AO, PAF and history of DVT on Coumadin and IVC filter, chronic HFrEF presented with worsening of right foot ulcer pain and discoloration, patient was recently referred to see a vascular surgery and arteriogram was planned for June 28.  In ED he was hemodynamically stable, CTA showed AAA to 5.5 cm and severe bilateral femoral popliteal PVD.  Vascular surgery was consulted.  6/19: Vital stable with heart rate in mid 50s, holding a dose of metoprolol and amiodarone today.  Vascular surgery wants to get cardiac clearance before proceeding due to severe aortic stenosis.  Patient has enlarging abdominal aortic aneurysm and does not appear to be a candidate for endovascular aneurysm repair.  He would require open surgery and will be very high risk given his age and moderate to severe aortic stenosis. Most likely be getting arteriogram on Friday.  INR elevated-vascular surgery ordered vitamin K.  Holding Coumadin.  6/20: Vitals with heart rate in mid 50s, blood pressure within low normal range, slight worsening of creatinine to 1.72, leukocytosis at 11.1.  Starting on Zosyn as toe is becoming more gangrenous with surrounding erythema and edema.  Holding amiodarone, metoprolol and Lasix today.  Going for aortogram and possible intervention with vascular surgery tomorrow.   Assessment and Plan: * Critical limb ischemia of right lower extremity with ulceration of foot [facility]) Vascular surgery was consulted and patient will be going for angiography on Friday.  History of severe PAD. Recently  completed a 10-day course of antibiotics, initially no antibiotics were given but now developed leukocytosis with worsening edema and erythema surrounding gangrenous toe. Cardiology clearance requested.  Will be high risk based on HFrEF, severe aortic stenosis and CAD. -Starting him on Zosyn -Continue with pain management  Abdominal aortic aneurysm (HCC) 5.5 cm on recent imaging.  Enlarging. Vascular surgery is on board and does not think that he will be a candidate for endovascular repair, likely will require open surgery which is going to be very high risk. -Observe for now  PAF (paroxysmal atrial fibrillation) (HCC) S/p DCCV in 04/2022 with early return in atrial fibrillation. He was started on Amiodarone and then underwent repeat DCCV in 05/2022.  - Maintaining sinus rhythm.  - Continue Toprol-XL 25mg  twice daily-holding morning dose of Toprol and amiodarone due to heart rate being in mid 50s - Continue Amiodarone 200mg  daily.  - On chronic anticoagulation with Coumadin. Currently on hold in anticipation of vascular procedure. Dosing per pharmacy.  DVT (deep venous thrombosis) (HCC) History of DVT.  Recent vascular ultrasound was negative for DVT in right lower extremity. -Home Coumadin is under hold for procedure  Ischemic cardiomyopathy Last echo in January 2024 with EF of 40 to 45%.  Euvolemic on exam. - Continue Lasix 40mg  twice daily.  - Continue Toprol-XL 25mg  twice daily. - Home Jardiance on hold in anticipation of possible surgery. - Has not been on ACEi/ARB/ ARNI or MRA due to renal function. - Continue to monitor volume status closely.   Peripheral arterial disease (HCC) Significant history of PAD.History of bilateral ostial common iliac stenting in  2014 and known SFA/ popliteal disease.  CTA showed severe bilateral femoropopliteal PAD with widely patent kissing iliac stents and chronic occlusion of left renal artery stent.  - Management per Vascular Surgery.  -They are  planning angiography likely on Friday after improvement of INR   Aortic valve stenosis History of severe aortic stenosis. He is not a candidate for any intervention and palliative approach was recommended  CKD (chronic kidney disease) stage 3, GFR 30-59 ml/min (HCC) Creatinine at 1.51 today, baseline appears to be around 1.6-1.8. -Monitor renal function -Avoid nephrotoxins  HTN (hypertension) Blood pressure within goal. -Continue home Lasix and metoprolol  COPD (chronic obstructive pulmonary disease) (HCC) No acute concern. -As needed bronchodilator  Hypothyroidism -Continue Synthroid   Subjective: Patient was having right foot pain.  Physical Exam: Vitals:   10/12/22 2150 10/13/22 0513 10/13/22 0800 10/13/22 0855  BP:  (!) 141/63 111/65 (!) 130/57  Pulse: 64 (!) 59 (!) 55 (!) 58  Resp:    18  Temp:  98.2 F (36.8 C)  99.1 F (37.3 C)  TempSrc:  Oral  Oral  SpO2:  (!) 89%  (!) 89%  Weight:      Height:       General.  Frail elderly man, in no acute distress. Pulmonary.  Lungs clear bilaterally, normal respiratory effort. CV.  Regular rate and rhythm, no JVD, rub or murmur. Abdomen.  Soft, nontender, nondistended, BS positive. CNS.  Alert and oriented .  No focal neurologic deficit. Extremities.  Gangrenous right toe with some surrounding edema and erythema. Psychiatry.  Judgment and insight appears normal.   Data Reviewed: Prior data reviewed  Family Communication: Discussed with wife on phone  Disposition: Status is: Inpatient Remains inpatient appropriate because: Severity of illness  Planned Discharge Destination: Home with Home Health  Time spent: 50 minutes  This record has been created using Conservation officer, historic buildings. Errors have been sought and corrected,but may not always be located. Such creation errors do not reflect on the standard of care.   Author: Arnetha Courser, MD 10/13/2022 11:56 AM  For on call review [email or web address].

## 2022-10-13 NOTE — Progress Notes (Signed)
Pharmacy Antibiotic Note  [name] is a 82 y.o. male admitted on 10/11/2022 with  wound infection .  Pharmacy has been consulted for Zosyn dosing. SCr up to 1.72.  Plan: Zosyn 3.375g IV ( infusion) x1; then 3.375g IV q8h (4h infusion) Monitor clinical progress, c/s, renal function F/u de-escalation plan/LOT    Height: 5\' 9"  (175.3 cm) Weight: 68 kg (149 lb 14.6 oz) IBW/kg (Calculated) : 70.7  Temp (24hrs), Avg:98.4 F (36.9 C), Min:98.2 F (36.8 C), Max:99.1 F (37.3 C)  Recent Labs  Lab 10/11/22 1056 10/11/22 1205 10/12/22 0044 10/13/22 0011  WBC 10.2  --  8.6 11.1*  CREATININE 1.62* 1.60* 1.51* 1.72*    Estimated Creatinine Clearance: 32.4 mL/min (A) (by C-G formula based on SCr of 1.72 mg/dL (H)).    Allergies  Allergen Reactions   Plavix [Clopidogrel Bisulfate]     Brain hemorrhage prev while on plavix and aspirin    Leia Alf, PharmD, BCPS Please check AMION for all [facility] Pharm[facility] contact numbers Clinical Pharmacist 10/13/2022 12:33 PM

## 2022-10-13 NOTE — Progress Notes (Addendum)
Progress Note    10/13/2022 8:12 AM  Subjective:  R foot pain overnight   Vitals:   10/13/22 0513 10/13/22 0800  BP: (!) 141/63 111/65  Pulse: (!) 59 (!) 55  Resp:    Temp: 98.2 F (36.8 C)   SpO2: (!) 89%    Physical Exam: Lungs:  non labored Extremities:  feet symmetrically warm Abdomen:  soft, NT, ND Neurologic: A&O  CBC    Component Value Date/Time   WBC 11.1 (H) 10/13/2022 0011   RBC 4.46 10/13/2022 0011   HGB 11.6 (L) 10/13/2022 0011   HGB 14.4 05/06/2021 0953   HCT 38.7 (L) 10/13/2022 0011   HCT 42.6 05/06/2021 0953   PLT 351 10/13/2022 0011   PLT 199 05/06/2021 0953   MCV 86.8 10/13/2022 0011   MCV 87 05/06/2021 0953   MCV 87 06/03/2013 0454   MCH 26.0 10/13/2022 0011   MCHC 30.0 10/13/2022 0011   RDW 18.2 (H) 10/13/2022 0011   RDW 13.6 05/06/2021 0953   RDW 14.2 06/03/2013 0454   LYMPHSABS 1.1 10/11/2022 1056   LYMPHSABS 1.8 05/06/2021 0953   LYMPHSABS 2.7 07/28/2012 0513   MONOABS 0.9 10/11/2022 1056   MONOABS 1.4 (H) 07/28/2012 0513   EOSABS 0.2 10/11/2022 1056   EOSABS 0.3 05/06/2021 0953   EOSABS 0.3 07/28/2012 0513   BASOSABS 0.1 10/11/2022 1056   BASOSABS 0.1 05/06/2021 0953   BASOSABS 1 06/03/2013 0454   BASOSABS 0.1 07/28/2012 0513    BMET    Component Value Date/Time   NA 134 (L) 10/13/2022 0011   NA 142 05/06/2021 0953   NA 136 06/03/2013 0454   K 3.4 (L) 10/13/2022 0011   K 4.1 06/03/2013 0454   CL 101 10/13/2022 0011   CL 106 06/03/2013 0454   CO2 25 10/13/2022 0011   CO2 27 06/03/2013 0454   GLUCOSE 105 (H) 10/13/2022 0011   GLUCOSE 80 06/03/2013 0454   BUN 20 10/13/2022 0011   BUN 22 05/06/2021 0953   BUN 22 (H) 06/03/2013 0454   CREATININE 1.72 (H) 10/13/2022 0011   CREATININE 1.59 (H) 06/03/2013 0454   CREATININE 1.02 04/22/2011 1507   CALCIUM 8.0 (L) 10/13/2022 0011   CALCIUM 8.8 06/03/2013 0454   GFRNONAA 39 (L) 10/13/2022 0011   GFRNONAA 43 (L) 06/03/2013 0454   GFRAA 59 (L) 12/30/2016 0103   GFRAA 50 (L)  06/03/2013 0454    INR    Component Value Date/Time   INR 1.8 (H) 10/13/2022 0011   INR 1.3 06/02/2013 0746     Intake/Output Summary (Last 24 hours) at 10/13/2022 2130 Last data filed at 10/12/2022 1500 Gross per 24 hour  Intake 480 ml  Output 650 ml  Net -170 ml     Assessment/Plan:  82 y.o. male with critical limb ischemia RLE  R foot rest pain overnight, some relief when hanging foot off of the bed; continue pain medication on schedule INR now 1.8; I have asked pharmacy to initiate heparin; continue to hold coumadin Patient is considered high risk but is optimized fro surgery from a cardiac standpoint Plan is for aortogram with R leg runoff tomorrow with Dr. [name]; this again was discussed with the patient and he is agreeable to proceed tomorrow Consent ordered Npo past midnight   Emilie Rutter, PA-C [facility] [phone] 10/13/2022 8:12 AM  I have interviewed the patient and examined the patient. I agree with the findings by the PA.  Will proceed with arteriography tomorrow.  The plan will be to address any inflow disease on the right.  I will have to it come from the right side as the iliac stents extend up into the aorta.  We can then evaluate him for potential right lower extremity bypass.  Unfortunately has severe diffuse calcific disease which will limit his options.  His vein map shows that the saphenous vein is not adequate on either side.  He will likely require a prosthetic bypass.  He will be at increased risk for surgery given his cardiac history as noted by cardiology.  He is at high risk for limb loss.  Cari Caraway, MD

## 2022-10-13 NOTE — Assessment & Plan Note (Addendum)
S/p DCCV in 04/2022 with early return in atrial fibrillation. He was started on Amiodarone and then underwent repeat DCCV in 05/2022.  - Maintaining sinus rhythm.  - Continue Toprol-XL 25mg  twice daily- --Hold AM dose Toprol due to heart rate being in mid 50s - Continue Amiodarone 200mg  daily.  - On chronic anticoagulation with Coumadin.  -Currently on hold in anticipation of vascular procedure. Dosing per pharmacy.

## 2022-10-14 ENCOUNTER — Encounter (HOSPITAL_COMMUNITY): Payer: Self-pay | Admitting: Internal Medicine

## 2022-10-14 ENCOUNTER — Encounter (HOSPITAL_COMMUNITY)
Admission: EM | Disposition: A | Discharge status: Skilled Nursing Facility | Payer: Self-pay | Source: Ambulatory Visit | Attending: Internal Medicine

## 2022-10-14 DIAGNOSIS — I70235 Atherosclerosis of native arteries of right leg with ulceration of other part of foot: Secondary | ICD-10-CM | POA: Diagnosis not present

## 2022-10-14 HISTORY — PX: ABDOMINAL AORTOGRAM W/LOWER EXTREMITY: CATH118223

## 2022-10-14 LAB — PROTIME-INR
INR: 1.7 — ABNORMAL HIGH (ref 0.8–1.2)
Prothrombin Time: 20.3 seconds — ABNORMAL HIGH (ref 11.4–15.2)

## 2022-10-14 LAB — CBC
HCT: 41 % (ref 39.0–52.0)
Hemoglobin: 12.4 g/dL — ABNORMAL LOW (ref 13.0–17.0)
MCH: 25.9 pg — ABNORMAL LOW (ref 26.0–34.0)
MCHC: 30.2 g/dL (ref 30.0–36.0)
MCV: 85.8 fL (ref 80.0–100.0)
Platelets: 347 10*3/uL (ref 150–400)
RBC: 4.78 MIL/uL (ref 4.22–5.81)
RDW: 18.1 % — ABNORMAL HIGH (ref 11.5–15.5)
WBC: 11.7 10*3/uL — ABNORMAL HIGH (ref 4.0–10.5)
nRBC: 0 % (ref 0.0–0.2)

## 2022-10-14 LAB — BASIC METABOLIC PANEL
Anion gap: 9 (ref 5–15)
BUN: 18 mg/dL (ref 8–23)
CO2: 26 mmol/L (ref 22–32)
Calcium: 8.1 mg/dL — ABNORMAL LOW (ref 8.9–10.3)
Chloride: 103 mmol/L (ref 98–111)
Creatinine, Ser: 1.48 mg/dL — ABNORMAL HIGH (ref 0.61–1.24)
GFR, Estimated: 47 mL/min — ABNORMAL LOW (ref >60)
Glucose, Bld: 108 mg/dL — ABNORMAL HIGH (ref 70–99)
Potassium: 3.7 mmol/L (ref 3.5–5.1)
Sodium: 138 mmol/L (ref 135–145)

## 2022-10-14 LAB — POCT ACTIVATED CLOTTING TIME: Activated Clotting Time: 159 seconds

## 2022-10-14 LAB — HEPARIN LEVEL (UNFRACTIONATED)
Heparin Unfractionated: 0.2 IU/mL — ABNORMAL LOW (ref 0.30–0.70)
Heparin Unfractionated: 0.22 IU/mL — ABNORMAL LOW (ref 0.30–0.70)

## 2022-10-14 SURGERY — ABDOMINAL AORTOGRAM W/LOWER EXTREMITY
Anesthesia: LOCAL

## 2022-10-14 MED ORDER — VITAMIN K1 10 MG/ML IJ SOLN
1.0000 mg | Freq: Once | INTRAVENOUS | Status: AC
  Administered 2022-10-14: 1 mg via INTRAVENOUS
  Filled 2022-10-14: qty 0.1

## 2022-10-14 MED ORDER — MIDAZOLAM HCL 2 MG/2ML IJ SOLN
INTRAMUSCULAR | Status: AC
  Filled 2022-10-14: qty 2

## 2022-10-14 MED ORDER — SODIUM CHLORIDE 0.9 % WEIGHT BASED INFUSION
1.0000 mL/kg/h | INTRAVENOUS | Status: AC
  Administered 2022-10-14: 1 mL/kg/h via INTRAVENOUS

## 2022-10-14 MED ORDER — ACETAMINOPHEN 325 MG PO TABS
650.0000 mg | ORAL_TABLET | ORAL | Status: DC | PRN
  Administered 2022-10-15: 650 mg via ORAL
  Filled 2022-10-14: qty 2

## 2022-10-14 MED ORDER — HEPARIN (PORCINE) IN NACL 1000-0.9 UT/500ML-% IV SOLN
INTRAVENOUS | Status: DC | PRN
  Administered 2022-10-14 (×2): 500 mL

## 2022-10-14 MED ORDER — HYDRALAZINE HCL 20 MG/ML IJ SOLN: 5.0000 mg | INTRAMUSCULAR | Status: DC | PRN

## 2022-10-14 MED ORDER — ONDANSETRON HCL 4 MG/2ML IJ SOLN: 4.0000 mg | Freq: Four times a day (QID) | INTRAMUSCULAR | Status: DC | PRN

## 2022-10-14 MED ORDER — LIDOCAINE HCL (PF) 1 % IJ SOLN
INTRAMUSCULAR | Status: DC | PRN
  Administered 2022-10-14: 10 mL

## 2022-10-14 MED ORDER — IODIXANOL 320 MG/ML IV SOLN
INTRAVENOUS | Status: DC | PRN
  Administered 2022-10-14: 45 mL

## 2022-10-14 MED ORDER — POLYETHYLENE GLYCOL 3350 17 G PO PACK
34.0000 g | PACK | Freq: Once | ORAL | Status: AC
  Administered 2022-10-14: 34 g via ORAL
  Filled 2022-10-14: qty 2

## 2022-10-14 MED ORDER — HEPARIN (PORCINE) 25000 UT/250ML-% IV SOLN
1700.0000 [IU]/h | INTRAVENOUS | Status: AC
  Administered 2022-10-14 – 2022-10-17 (×6): 1700 [IU]/h via INTRAVENOUS
  Filled 2022-10-14 (×6): qty 250

## 2022-10-14 MED ORDER — SODIUM CHLORIDE 0.9% FLUSH: 3.0000 mL | Freq: Two times a day (BID) | INTRAVENOUS | Status: DC

## 2022-10-14 MED ORDER — SODIUM CHLORIDE 0.9% FLUSH: 3.0000 mL | INTRAVENOUS | Status: DC | PRN

## 2022-10-14 MED ORDER — SODIUM CHLORIDE 0.9 % IV SOLN: INTRAVENOUS | Status: DC

## 2022-10-14 MED ORDER — SODIUM CHLORIDE 0.9 % IV SOLN: 250.0000 mL | INTRAVENOUS | Status: DC | PRN

## 2022-10-14 MED ORDER — MIDAZOLAM HCL 2 MG/2ML IJ SOLN
INTRAMUSCULAR | Status: DC | PRN
  Administered 2022-10-14: 1 mg via INTRAVENOUS

## 2022-10-14 MED ORDER — LIDOCAINE HCL (PF) 1 % IJ SOLN
INTRAMUSCULAR | Status: AC
  Filled 2022-10-14: qty 30

## 2022-10-14 MED ORDER — LABETALOL HCL 5 MG/ML IV SOLN: 10.0000 mg | INTRAVENOUS | Status: DC | PRN

## 2022-10-14 SURGICAL SUPPLY — 12 items
CATH ANGIO 5F PIGTAIL 65CM (CATHETERS) IMPLANT
GUIDEWIRE ANGLED .035X150CM (WIRE) IMPLANT
KIT MICROPUNCTURE NIT STIFF (SHEATH) IMPLANT
KIT PV (KITS) ×1 IMPLANT
SHEATH PINNACLE 5F 10CM (SHEATH) IMPLANT
SHEATH PROBE COVER 6X72 (BAG) IMPLANT
STOPCOCK MORSE 400PSI 3WAY (MISCELLANEOUS) IMPLANT
SYR MEDRAD MARK 7 150ML (SYRINGE) ×1 IMPLANT
TRANSDUCER W/STOPCOCK (MISCELLANEOUS) ×1 IMPLANT
TRAY PV CATH (CUSTOM PROCEDURE TRAY) ×1 IMPLANT
TUBING CIL FLEX 10 FLL-RA (TUBING) IMPLANT
WIRE BENTSON .035X145CM (WIRE) IMPLANT

## 2022-10-14 NOTE — Care Management Important Message (Signed)
Important Message  Patient Details  Name: [name] MRN: [id number] Date of Birth: 02/16/1941   Medicare Important Message Given:  Yes     Sherilyn Banker 10/14/2022, 12:38 PM

## 2022-10-14 NOTE — H&P (View-Only) (Signed)
  Progress Note    10/14/2022 7:44 AM * No surgery date entered *  Subjective:  R foot rest pain overnight   Vitals:   10/13/22 2008 10/14/22 0730  BP: (!) 118/58 (!) 135/57  Pulse: 60 (!) 58  Resp: 20 18  Temp: 98.1 F (36.7 C) 98.9 F (37.2 C)  SpO2: 90% 95%   Physical Exam Lungs:  non labored Extremities:  dressing left in place R foot Neurologic: a&O   INR 1.7  CBC    Component Value Date/Time   WBC 11.7 (H) 10/14/2022 0229   RBC 4.78 10/14/2022 0229   HGB 12.4 (L) 10/14/2022 0229   HGB 14.4 05/06/2021 0953   HCT 41.0 10/14/2022 0229   HCT 42.6 05/06/2021 0953   PLT 347 10/14/2022 0229   PLT 199 05/06/2021 0953   MCV 85.8 10/14/2022 0229   MCV 87 05/06/2021 0953   MCV 87 06/03/2013 0454   MCH 25.9 (L) 10/14/2022 0229   MCHC 30.2 10/14/2022 0229   RDW 18.1 (H) 10/14/2022 0229   RDW 13.6 05/06/2021 0953   RDW 14.2 06/03/2013 0454   LYMPHSABS 1.1 10/11/2022 1056   LYMPHSABS 1.8 05/06/2021 0953   LYMPHSABS 2.7 07/28/2012 0513   MONOABS 0.9 10/11/2022 1056   MONOABS 1.4 (H) 07/28/2012 0513   EOSABS 0.2 10/11/2022 1056   EOSABS 0.3 05/06/2021 0953   EOSABS 0.3 07/28/2012 0513   BASOSABS 0.1 10/11/2022 1056   BASOSABS 0.1 05/06/2021 0953   BASOSABS 1 06/03/2013 0454   BASOSABS 0.1 07/28/2012 0513    BMET    Component Value Date/Time   NA 138 10/14/2022 0229   NA 142 05/06/2021 0953   NA 136 06/03/2013 0454   K 3.7 10/14/2022 0229   K 4.1 06/03/2013 0454   CL 103 10/14/2022 0229   CL 106 06/03/2013 0454   CO2 26 10/14/2022 0229   CO2 27 06/03/2013 0454   GLUCOSE 108 (H) 10/14/2022 0229   GLUCOSE 80 06/03/2013 0454   BUN 18 10/14/2022 0229   BUN 22 05/06/2021 0953   BUN 22 (H) 06/03/2013 0454   CREATININE 1.48 (H) 10/14/2022 0229   CREATININE 1.59 (H) 06/03/2013 0454   CREATININE 1.02 04/22/2011 1507   CALCIUM 8.1 (L) 10/14/2022 0229   CALCIUM 8.8 06/03/2013 0454   GFRNONAA 47 (L) 10/14/2022 0229   GFRNONAA 43 (L) 06/03/2013 0454   GFRAA  59 (L) 12/30/2016 0103   GFRAA 50 (L) 06/03/2013 0454    INR    Component Value Date/Time   INR 1.7 (H) 10/14/2022 0617   INR 1.3 06/02/2013 0746     Intake/Output Summary (Last 24 hours) at 10/14/2022 0744 Last data filed at 10/14/2022 0619 Gross per 24 hour  Intake 209.14 ml  Output 1200 ml  Net -990.86 ml     Assessment/Plan:  82 y.o. male with critical limb ischemia R LE  Plan is for aortogram with R leg runoff and possible intervention today with Dr. Dickson likely via R femoral approach.  We also reviewed that he will likely require a bypass next week for limb salvage based on results today.  All questions were answered and he is agreeable to proceed.  Consent ordered.  Continue npo   Joffre Lucks, PA-C [facility] [phone] 10/14/2022 7:44 AM    

## 2022-10-14 NOTE — Progress Notes (Signed)
ANTICOAGULATION CONSULT NOTE  Pharmacy Consult for heparin Indication: critical limb ischemia, hx afib/DVT  Allergies  Allergen Reactions   Plavix [Clopidogrel Bisulfate]     Brain hemorrhage prev while on plavix and aspirin    Patient Measurements: Height: 5\' 9"  (175.3 cm) Weight: 68 kg (149 lb 14.6 oz) IBW/kg (Calculated) : 70.7 Heparin Dosing Weight: 68 kg  Vital Signs: Temp: 98.9 F (37.2 C) (06/21 0730) Temp Source: Oral (06/21 0730) BP: 158/64 (06/21 1405) Pulse Rate: 62 (06/21 1405)  Labs: Recent Labs    10/12/22 0044 10/13/22 0011 10/13/22 1652 10/14/22 0229 10/14/22 0617 10/14/22 1110  HGB 11.9* 11.6*  --  12.4*  --   --   HCT 38.6* 38.7*  --  41.0  --   --   PLT 317 351  --  347  --   --   LABPROT 30.1* 21.3*  --   --  20.3*  --   INR 2.8* 1.8*  --   --  1.7*  --   HEPARINUNFRC  --   --  <0.10* 0.20*  --  0.22*  CREATININE 1.51* 1.72*  --  1.48*  --   --      Estimated Creatinine Clearance: 37.7 mL/min (A) (by C-G formula based on SCr of 1.48 mg/dL (H)).   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 06/2011 Echo: EF 35-40%; b. 06/2012 Echo: EF 50%; c. 12/2016 Echo: EF 55-60%, no rwma, GRI DD, mild AS/MR, nl RV fxn, nl RVSP; d. 02/2021 Echo: EF 55%, GrII DD. Mild MR. Mild to mod TR. Sev AS by VTI.   AAA (abdominal aortic aneurysm) (HCC)    a. 05/2020 Abd u/s: 3.6cm.   Aortic calcification (HCC) 05/01/2013   Bilateral renal artery stenosis (HCC)    a. 06/2013 Angio/PTA: LRA: 95 (5x18 Herculink stent), RRA 60ost; b. 04/2021 Angio: mod RRA stenosis w/ patent LRA stent.   Carotid arterial disease (HCC)    a. 05/2020 Carotid U/S: RICA 1-39%, LICA 40-59%   CHF (congestive heart failure) (HCC)    Chronic kidney disease    COPD (chronic obstructive pulmonary disease) (HCC)    Coronary artery disease    a. 2013 s/p CABG x 3 (LIMA->LAD, VG->OM, VG->RPDA; b. 06/2012 MV: no ischemia; c.  02/2021 Cath: LM 100, RCA 90ost, 118m, free LIMA->LAD nl, VG->OM2 nl, VG->RPDA nl. Mod AS (mean grad , AVA 1.1cm^2). RHC w/ elev filling pressures.   History of prior cigarette smoking 04/11/2008   Qualifier: Diagnosis of  By: Ermalene Searing MD, Amy     Hyperlipidemia    Hypertension    Hypothyroidism    Intraventricular hemorrhage (HCC) 07/12/2012   Ischemic cardiomyopathy    a. 06/2011 Echo: EF 35-40%; b. 06/2012 Echo: EF 50%; c. 12/2016 Echo: EF 55-60%, no rwma, GRI DD; d. 02/2021 Echo: Ef 55%, GrII DD.   Moderate aortic stenosis    a. 02/2021 Echo: EF 55%, GrII DD. Sev Ca2+ of AoV. Sev AS w/ AVA by VTI 0.79cm^2; b. 02/2021 Cath: Mod AS w/ mean grad . AVA 1.1cm^2.   PAF (paroxysmal atrial fibrillation) (HCC)    Peripheral arterial disease (HCC)    a. Previous left lower extremity stenting by Dr. Evette Cristal;  b. 12/2012 s/p bilat ostial common iliac stenting; c. 04/2021 Angio: Patent LRA stent, mod RRA stenosis. Small AAA. Sev plaque RSFA 2/ subtl occl of inf branch of profunda. Diff Ca2+ SFA dzs w/  collats from profunda and 3V runoff. Diffuse LSFA dzs w/ collats from profunda. Subtl PT dzs. 3V runoff-->Med Rx.   Right leg DVT (HCC) 08/13/2012   Subclavian artery stenosis, left (HCC) 05/2012   Status post stenting of the ostium and self-expanding stent placement to the left axillary artery   Venous insufficiency     Medications:  Medications Prior to Admission  Medication Sig Dispense Refill Last Dose   albuterol (VENTOLIN HFA) 108 (90 Base) MCG/ACT inhaler Inhale 2 puffs into the lungs every 6 (six) hours as needed for wheezing or shortness of breath. 18 g 0 Past Month   amiodarone (PACERONE) 200 MG tablet TAKE 1 TABLET BY MOUTH TWICE A DAY 180 tablet 1 10/11/2022   amoxicillin-clavulanate (AUGMENTIN) 875-125 MG tablet Take 1 tablet by mouth 2 (two) times daily.   10/10/2022   atorvastatin (LIPITOR) 20 MG tablet TAKE 1 TABLET BY MOUTH EVERY DAY 30 tablet 5 10/11/2022   augmented betamethasone  dipropionate (DIPROLENE-AF) 0.05 % cream Apply 1 application  topically 2 (two) times daily as needed (breakouts).   Past Week   empagliflozin (JARDIANCE) 10 MG TABS tablet Take 1 tablet (10 mg total) by mouth daily before breakfast. 30 tablet 3 10/11/2022   furosemide (LASIX) 40 MG tablet Take 1 tablet (40 mg total) by mouth 2 (two) times daily. 60 tablet 3 10/11/2022   levothyroxine (SYNTHROID) 150 MCG tablet TAKE ONE TABLET BY MOUTH EVERY DAY BEFORE BREAKFAST (Patient taking differently: Take 150 mcg by mouth daily before breakfast. TAKE ONE TABLET BY MOUTH EVERY DAY BEFORE BREAKFAST) 90 tablet 3 10/11/2022   metoprolol succinate (TOPROL XL) 25 MG 24 hr tablet Take 1 tablet (25 mg total) by mouth 2 (two) times daily. 60 tablet 3 10/11/2022 at 8 am   warfarin (COUMADIN) 5 MG tablet Take 2.5 mg by mouth See admin instructions. Take 2.5 mg by mouth once daily except Saturday.   10/10/2022 at 6 pm   Scheduled:   [MAR Hold] amiodarone  200 mg Oral BID   [MAR Hold] atorvastatin  20 mg Oral Daily   [MAR Hold] furosemide  40 mg Oral BID   [MAR Hold] levothyroxine  150 mcg Oral Q0600   [MAR Hold] metoprolol succinate  25 mg Oral BID   Assessment: 82 yom with hx afib/DVT s/p IVC filter on warfarin PTA presenting with critical limb ischemia. Pharmacy consulted to dose heparin when INR <2. Warfarin held. INR 2.5 on presentation. Patient previously on warfarin 2.5 mg daily except no dose on Sat. CBC stable. No bleed issues reported. Continues on PTA amiodarone inpatient.  INR 1.7 - down a bit. SCr down to 1.48.  Heparin level 0.22 units/mL on heparin 1500 units/hr this am then held for [facility] lab. Pharmacy to resume heparin 8h after sheath removal (~1330).  Goal of Therapy:  Heparin level 0.3-0.7 units/ml Monitor platelets by anticoagulation protocol: Yes   Plan:  Resume heparin 1700 units/h no bolus at 2130 Recheck heparin level with am labs  Fredonia Highland, PharmD, BCPS, [facility] Clinical  Pharmacist [phone] Please check AMION for all [facility] Pharmacy numbers 10/14/2022

## 2022-10-14 NOTE — Progress Notes (Signed)
  Progress Note    10/14/2022 7:44 AM * No surgery date entered *  Subjective:  R foot rest pain overnight   Vitals:   10/13/22 2008 10/14/22 0730  BP: (!) 118/58 (!) 135/57  Pulse: 60 (!) 58  Resp: 20 18  Temp: 98.1 F (36.7 C) 98.9 F (37.2 C)  SpO2: 90% 95%   Physical Exam Lungs:  non labored Extremities:  dressing left in place R foot Neurologic: a&O   INR 1.7  CBC    Component Value Date/Time   WBC 11.7 (H) 10/14/2022 0229   RBC 4.78 10/14/2022 0229   HGB 12.4 (L) 10/14/2022 0229   HGB 14.4 05/06/2021 0953   HCT 41.0 10/14/2022 0229   HCT 42.6 05/06/2021 0953   PLT 347 10/14/2022 0229   PLT 199 05/06/2021 0953   MCV 85.8 10/14/2022 0229   MCV 87 05/06/2021 0953   MCV 87 06/03/2013 0454   MCH 25.9 (L) 10/14/2022 0229   MCHC 30.2 10/14/2022 0229   RDW 18.1 (H) 10/14/2022 0229   RDW 13.6 05/06/2021 0953   RDW 14.2 06/03/2013 0454   LYMPHSABS 1.1 10/11/2022 1056   LYMPHSABS 1.8 05/06/2021 0953   LYMPHSABS 2.7 07/28/2012 0513   MONOABS 0.9 10/11/2022 1056   MONOABS 1.4 (H) 07/28/2012 0513   EOSABS 0.2 10/11/2022 1056   EOSABS 0.3 05/06/2021 0953   EOSABS 0.3 07/28/2012 0513   BASOSABS 0.1 10/11/2022 1056   BASOSABS 0.1 05/06/2021 0953   BASOSABS 1 06/03/2013 0454   BASOSABS 0.1 07/28/2012 0513    BMET    Component Value Date/Time   NA 138 10/14/2022 0229   NA 142 05/06/2021 0953   NA 136 06/03/2013 0454   K 3.7 10/14/2022 0229   K 4.1 06/03/2013 0454   CL 103 10/14/2022 0229   CL 106 06/03/2013 0454   CO2 26 10/14/2022 0229   CO2 27 06/03/2013 0454   GLUCOSE 108 (H) 10/14/2022 0229   GLUCOSE 80 06/03/2013 0454   BUN 18 10/14/2022 0229   BUN 22 05/06/2021 0953   BUN 22 (H) 06/03/2013 0454   CREATININE 1.48 (H) 10/14/2022 0229   CREATININE 1.59 (H) 06/03/2013 0454   CREATININE 1.02 04/22/2011 1507   CALCIUM 8.1 (L) 10/14/2022 0229   CALCIUM 8.8 06/03/2013 0454   GFRNONAA 47 (L) 10/14/2022 0229   GFRNONAA 43 (L) 06/03/2013 0454   GFRAA  59 (L) 12/30/2016 0103   GFRAA 50 (L) 06/03/2013 0454    INR    Component Value Date/Time   INR 1.7 (H) 10/14/2022 0617   INR 1.3 06/02/2013 0746     Intake/Output Summary (Last 24 hours) at 10/14/2022 0744 Last data filed at 10/14/2022 1610 Gross per 24 hour  Intake 209.14 ml  Output 1200 ml  Net -990.86 ml     Assessment/Plan:  82 y.o. male with critical limb ischemia R LE  Plan is for aortogram with R leg runoff and possible intervention today with Dr. [name] likely via R femoral approach.  We also reviewed that he will likely require a bypass next week for limb salvage based on results today.  All questions were answered and he is agreeable to proceed.  Consent ordered.  Continue npo   Emilie Rutter, PA-C [facility] [phone] 10/14/2022 7:44 AM

## 2022-10-14 NOTE — Progress Notes (Signed)
ANTICOAGULATION CONSULT NOTE  Pharmacy Consult for heparin Indication: critical limb ischemia, hx afib/DVT  Allergies  Allergen Reactions   Plavix [Clopidogrel Bisulfate]     Brain hemorrhage prev while on plavix and aspirin    Patient Measurements: Height: 5\' 9"  (175.3 cm) Weight: 68 kg (149 lb 14.6 oz) IBW/kg (Calculated) : 70.7 Heparin Dosing Weight: 68 kg  Vital Signs: Temp: 98.9 F (37.2 C) (06/21 0730) Temp Source: Oral (06/21 0730) BP: 161/64 (06/21 1240) Pulse Rate: 60 (06/21 1240)  Labs: Recent Labs    10/12/22 0044 10/13/22 0011 10/13/22 1652 10/14/22 0229 10/14/22 0617 10/14/22 1110  HGB 11.9* 11.6*  --  12.4*  --   --   HCT 38.6* 38.7*  --  41.0  --   --   PLT 317 351  --  347  --   --   LABPROT 30.1* 21.3*  --   --  20.3*  --   INR 2.8* 1.8*  --   --  1.7*  --   HEPARINUNFRC  --   --  <0.10* 0.20*  --  0.22*  CREATININE 1.51* 1.72*  --  1.48*  --   --      Estimated Creatinine Clearance: 37.7 mL/min (A) (by C-G formula based on SCr of 1.48 mg/dL (H)).   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 06/2011 Echo: EF 35-40%; b. 06/2012 Echo: EF 50%; c. 12/2016 Echo: EF 55-60%, no rwma, GRI DD, mild AS/MR, nl RV fxn, nl RVSP; d. 02/2021 Echo: EF 55%, GrII DD. Mild MR. Mild to mod TR. Sev AS by VTI.   AAA (abdominal aortic aneurysm) (HCC)    a. 05/2020 Abd u/s: 3.6cm.   Aortic calcification (HCC) 05/01/2013   Bilateral renal artery stenosis (HCC)    a. 06/2013 Angio/PTA: LRA: 95 (5x18 Herculink stent), RRA 60ost; b. 04/2021 Angio: mod RRA stenosis w/ patent LRA stent.   Carotid arterial disease (HCC)    a. 05/2020 Carotid U/S: RICA 1-39%, LICA 40-59%   CHF (congestive heart failure) (HCC)    Chronic kidney disease    COPD (chronic obstructive pulmonary disease) (HCC)    Coronary artery disease    a. 2013 s/p CABG x 3 (LIMA->LAD, VG->OM, VG->RPDA; b. 06/2012 MV: no ischemia; c.  02/2021 Cath: LM 100, RCA 90ost, 115m, free LIMA->LAD nl, VG->OM2 nl, VG->RPDA nl. Mod AS (mean grad , AVA 1.1cm^2). RHC w/ elev filling pressures.   History of prior cigarette smoking 04/11/2008   Qualifier: Diagnosis of  By: Ermalene Searing MD, Amy     Hyperlipidemia    Hypertension    Hypothyroidism    Intraventricular hemorrhage (HCC) 07/12/2012   Ischemic cardiomyopathy    a. 06/2011 Echo: EF 35-40%; b. 06/2012 Echo: EF 50%; c. 12/2016 Echo: EF 55-60%, no rwma, GRI DD; d. 02/2021 Echo: Ef 55%, GrII DD.   Moderate aortic stenosis    a. 02/2021 Echo: EF 55%, GrII DD. Sev Ca2+ of AoV. Sev AS w/ AVA by VTI 0.79cm^2; b. 02/2021 Cath: Mod AS w/ mean grad . AVA 1.1cm^2.   PAF (paroxysmal atrial fibrillation) (HCC)    Peripheral arterial disease (HCC)    a. Previous left lower extremity stenting by Dr. Evette Cristal;  b. 12/2012 s/p bilat ostial common iliac stenting; c. 04/2021 Angio: Patent LRA stent, mod RRA stenosis. Small AAA. Sev plaque RSFA 2/ subtl occl of inf branch of profunda. Diff Ca2+ SFA dzs w/  collats from profunda and 3V runoff. Diffuse LSFA dzs w/ collats from profunda. Subtl PT dzs. 3V runoff-->Med Rx.   Right leg DVT (HCC) 08/13/2012   Subclavian artery stenosis, left (HCC) 05/2012   Status post stenting of the ostium and self-expanding stent placement to the left axillary artery   Venous insufficiency     Medications:  Medications Prior to Admission  Medication Sig Dispense Refill Last Dose   albuterol (VENTOLIN HFA) 108 (90 Base) MCG/ACT inhaler Inhale 2 puffs into the lungs every 6 (six) hours as needed for wheezing or shortness of breath. 18 g 0 Past Month   amiodarone (PACERONE) 200 MG tablet TAKE 1 TABLET BY MOUTH TWICE A DAY 180 tablet 1 10/11/2022   amoxicillin-clavulanate (AUGMENTIN) 875-125 MG tablet Take 1 tablet by mouth 2 (two) times daily.   10/10/2022   atorvastatin (LIPITOR) 20 MG tablet TAKE 1 TABLET BY MOUTH EVERY DAY 30 tablet 5 10/11/2022   augmented betamethasone  dipropionate (DIPROLENE-AF) 0.05 % cream Apply 1 application  topically 2 (two) times daily as needed (breakouts).   Past Week   empagliflozin (JARDIANCE) 10 MG TABS tablet Take 1 tablet (10 mg total) by mouth daily before breakfast. 30 tablet 3 10/11/2022   furosemide (LASIX) 40 MG tablet Take 1 tablet (40 mg total) by mouth 2 (two) times daily. 60 tablet 3 10/11/2022   levothyroxine (SYNTHROID) 150 MCG tablet TAKE ONE TABLET BY MOUTH EVERY DAY BEFORE BREAKFAST (Patient taking differently: Take 150 mcg by mouth daily before breakfast. TAKE ONE TABLET BY MOUTH EVERY DAY BEFORE BREAKFAST) 90 tablet 3 10/11/2022   metoprolol succinate (TOPROL XL) 25 MG 24 hr tablet Take 1 tablet (25 mg total) by mouth 2 (two) times daily. 60 tablet 3 10/11/2022 at 8 am   warfarin (COUMADIN) 5 MG tablet Take 2.5 mg by mouth See admin instructions. Take 2.5 mg by mouth once daily except Saturday.   10/10/2022 at 6 pm   Scheduled:   [MAR Hold] amiodarone  200 mg Oral BID   [MAR Hold] atorvastatin  20 mg Oral Daily   [MAR Hold] furosemide  40 mg Oral BID   [MAR Hold] levothyroxine  150 mcg Oral Q0600   [MAR Hold] metoprolol succinate  25 mg Oral BID   Assessment: 82 yom with hx afib/DVT s/p IVC filter on warfarin PTA presenting with critical limb ischemia. Pharmacy consulted to dose heparin when INR <2. Warfarin held. INR 2.5 on presentation. Patient previously on warfarin 2.5 mg daily except no dose on Sat. CBC stable. No bleed issues reported. Continues on PTA amiodarone inpatient.  INR 1.7 - down a bit. SCr down to 1.48.  Heparin level 0.22 units/mL on heparin 1500 units/hr; however, patient's heparin was then turned off for Vascular procedure. Level appears to have been drawn prior to heparin infusion being stopped. CBC stable. No bleed issues reported.  Goal of Therapy:  Heparin level 0.3-0.7 units/ml Monitor platelets by anticoagulation protocol: Yes   Plan:  F/u heparin resumption post-Vascular procedure Check  heparin level 8 hrs from resumption Monitor daily CBC, s/sx bleeding F/u Vascular plans for further procedures next week   Leia Alf, PharmD, BCPS Please check AMION for all [facility] contact numbers Clinical Pharmacist 10/14/2022 1:27 PM

## 2022-10-14 NOTE — Op Note (Signed)
PATIENT: [name]      MRN: [id number] DOB: [birth year]    DATE OF PROCEDURE: 10/14/2022  INDICATIONS:    [name] is a 82 y.o. male who presents with a nonhealing wound on the right foot.  He had evidence of diffuse infrainguinal arterial occlusive disease.  He presents for arteriography.  PROCEDURE:    Conscious sedation Ultrasound-guided access to the right common femoral artery Aortogram with bilateral iliac arteriogram Retrograde right femoral arteriogram with right lower extremity runoff  SURGEON: Di Kindle. [name], MD, FACS  ANESTHESIA: Local with sedation  EBL: Minimal  TECHNIQUE: The patient was brought to the peripheral vascular lab and was sedated. The period of conscious sedation was 40 minutes.  During that time period, I was present face-to-face 100% of the time.  The patient was administered 1 mg of Versed. The patient's heart rate, blood pressure, and oxygen saturation were monitored by the nurse continuously during the procedure.  Both groins were prepped and draped in the usual sterile fashion.  I looked with the SonoSite at the right common femoral artery.  Of note the patient had iliac stents that extended into the infrarenal aorta so I did not think a contralateral approach could be used.  There was a bulky calcific plaque in the posterior and medial aspect of the common femoral artery.  In order to get above that, I had to stick fairly high on the common femoral artery.  This was done under ultrasound guidance after the skin was anesthetized.  The artery was cannulated with a micropuncture needle and a micropuncture sheath introduced over the wire.  I then injected through the micropuncture sheath to determine that the stick was not too high and I thought that it was just below the inguinal ligament.   The micropuncture sheath was exchanged for a 5 French sheath over a Bentson wire.  By ultrasound the femoral artery was patent. A real-time image was  obtained and sent to the server.  The pigtail catheter was positioned in the distal infrarenal aorta.  Of note I was trying to conserve contrast given his mild renal insufficiency.  Therefore I did not study the renals.  Although the iliac system was open on the right the arteries were small and the catheter itself was essentially occlusive.  I then removed the pigtail catheter over a wire and a retrograde right femoral arteriogram was obtained to the right femoral sheath.  The wire was then removed.  Next a retrograde right femoral arteriogram was obtained with right lower extremity runoff.  We did this in stages as the patient was moving around quite a bit.  At the completion of the procedure the patient was transferred to the holding area for removal of the sheath.  No immediate complications were noted.  FINDINGS:   The right common iliac artery stent is widely patent.  The right external iliac artery and hypogastric arteries are patent.  The arteries are small with diffuse calcific disease.   The right common femoral artery has a bulky calcific plaque on the posterior and medial aspect of the artery.  The deep femoral artery is patent although small.  The superficial femoral artery is occluded at its origin.  There is reconstitution below that however there is severe diffuse disease throughout the entire right superficial femoral artery and popliteal artery.  There is flow in the below-knee popliteal artery although this artery 2 is markedly calcified and this was also seen on CT scan. Below  that there is severe diffuse tibial disease.  The anterior tibial artery is heavily calcified all the way down to the ankle.  The dorsalis pedis is not visualized.  The peroneal artery has severe diffuse disease.  The posterior tibial artery is severely calcified and diseased and occludes above the ankle.    CLINICAL NOTE: This patient has a nonhealing wound of the right foot with severe infrainguinal arterial  occlusive disease.  Even with revascularization I think he is at significant risk for limb loss.  He feels strongly about trying to save the limb.  He is not a candidate for an endovascular approach.  I think the only option would be right common femoral artery endarterectomy and a right femoral to below-knee popliteal artery bypass with a prosthetic graft.  I am not sure that I could sew to the femoral artery given the amount of's calcific disease however there is no other targets based on his arteriogram and the amount of calcific disease.  His veins were mapped were not adequate for an autogenous bypass.  I will discuss this with him further over the weekend and discussed the options of primary amputation versus attempted right femoral endarterectomy with right femoral to below-knee popliteal artery bypass with PTFE.  Waverly Ferrari, MD, FACS [facility]  DATE OF DICTATION:   10/14/2022

## 2022-10-14 NOTE — Progress Notes (Signed)
58F arterial sheath removed from right groin.  Manual pressure held for 25 minutes.  Right groin level 0 pre and post pull.  Right DP doppled post sheath pull.  Bedrest started at 1430.  Gauze and tegaderm applied to right groin.  Instructions reviewed with patient.

## 2022-10-14 NOTE — Interval H&P Note (Signed)
History and Physical Interval Note:  10/14/2022 12:41 PM  [name]  has presented today for surgery, with the diagnosis of PAD with ulcer right foot.  The various methods of treatment have been discussed with the patient and family. After consideration of risks, benefits and other options for treatment, the patient has consented to  Procedure(s): ABDOMINAL AORTOGRAM W/LOWER EXTREMITY (N/A) as a surgical intervention.  The patient's history has been reviewed, patient examined, no change in status, stable for surgery.  I have reviewed the patient's chart and labs.  Questions were answered to the patient's satisfaction.     [name]

## 2022-10-14 NOTE — Progress Notes (Signed)
ANTICOAGULATION CONSULT NOTE - Follow Up Consult  Pharmacy Consult for heparin Indication:  critical limb ischemia w/ h/o Afib/VTE  Labs: Recent Labs    10/11/22 1056 10/11/22 1205 10/12/22 0044 10/13/22 0011 10/13/22 1652 10/14/22 0229  HGB 12.4*   < > 11.9* 11.6*  --  12.4*  HCT 40.7   < > 38.6* 38.7*  --  41.0  PLT 336  --  317 351  --  347  LABPROT 27.5*  --  30.1* 21.3*  --   --   INR 2.5*  --  2.8* 1.8*  --   --   HEPARINUNFRC  --   --   --   --  <0.10* 0.20*  CREATININE 1.62*   < > 1.51* 1.72*  --  1.48*   < > = values in this interval not displayed.    Assessment: 82yo male subtherapeutic on heparin after rate change; no infusion issues or signs of bleeding per RN.  Goal of Therapy:  Heparin level 0.3-0.7 units/ml   Plan:  Increase heparin infusion by 3 units/kg/hr to 1500 units/hr. Check level in 8 hours.   Vernard Gambles, PharmD, BCPS 10/14/2022 3:41 AM

## 2022-10-14 NOTE — Progress Notes (Signed)
Progress Note   Patient: [name] ZOX:[id number] DOB: Dec 15, 1940 DOA: 10/11/2022     3 DOS: the patient was seen and examined on 10/14/2022   Brief hospital course: About 2 weeks ago with gradual worsening.  Taken from H&P.  [name] is a 82 y.o. male with medical history significant of PVD status post left subclavian artery and left axillary artery stenting,, CAD status post CABG, CKD stage IIIb, severe AO, PAF and history of DVT on Coumadin and IVC filter, chronic HFrEF presented with worsening of right foot ulcer pain and discoloration, patient was recently referred to see a vascular surgery and arteriogram was planned for June 28.  In ED he was hemodynamically stable, CTA showed AAA to 5.5 cm and severe bilateral femoral popliteal PVD.  Vascular surgery was consulted.  6/19: Vital stable with heart rate in mid 50s, holding a dose of metoprolol and amiodarone today.  Vascular surgery wants to get cardiac clearance before proceeding due to severe aortic stenosis.  Patient has enlarging abdominal aortic aneurysm and does not appear to be a candidate for endovascular aneurysm repair.  He would require open surgery and will be very high risk given his age and moderate to severe aortic stenosis. Most likely be getting arteriogram on Friday.  INR elevated-vascular surgery ordered vitamin K.  Holding Coumadin.  6/20: Vitals with heart rate in mid 50s, blood pressure within low normal range, slight worsening of creatinine to 1.72, leukocytosis at 11.1.  Starting on Zosyn as toe is becoming more gangrenous with surrounding erythema and edema.  Holding amiodarone, metoprolol and Lasix today.  Going for aortogram and possible intervention with vascular surgery tomorrow.   Assessment and Plan: * Critical limb ischemia of right lower extremity with ulceration of foot [facility]) Vascular surgery was consulted and patient will be going for angiography on Friday.  History of severe PAD. Recently  completed a 10-day course of antibiotics, initially no antibiotics were given but now developed leukocytosis with worsening edema and erythema surrounding gangrenous toe. Cardiology clearance requested.  Will be high risk based on HFrEF, severe aortic stenosis and CAD. -Vascular surgery following -Plan for aortogram and potential intervention today -Continue Zosyn (started 6/20) -Continue with pain management  Abdominal aortic aneurysm (HCC) 5.5 cm on recent imaging.  Enlarging. Vascular surgery is on board and does not think that he will be a candidate for endovascular repair, likely will require open surgery which is going to be very high risk. -Observe for now  PAF (paroxysmal atrial fibrillation) (HCC) S/p DCCV in 04/2022 with early return in atrial fibrillation. He was started on Amiodarone and then underwent repeat DCCV in 05/2022.  - Maintaining sinus rhythm.  - Continue Toprol-XL 25mg  twice daily- --Hold AM dose Toprol due to heart rate being in mid 50s - Continue Amiodarone 200mg  daily.  - On chronic anticoagulation with Coumadin.  -Currently on hold in anticipation of vascular procedure. Dosing per pharmacy.  DVT (deep venous thrombosis) (HCC) History of DVT.  Recent vascular ultrasound was negative for DVT in right lower extremity. -Home Coumadin is under hold for procedure  Ischemic cardiomyopathy Last echo in January 2024 with EF of 40 to 45%.  Euvolemic on exam. - Continue Lasix 40mg  twice daily.  - Continue Toprol-XL 25mg  twice daily. - Home Jardiance on hold in anticipation of possible surgery. - Has not been on ACEi/ARB/ ARNI or MRA due to renal function. - Continue to monitor volume status closely.   Peripheral arterial disease (HCC) Significant history  of PAD.History of bilateral ostial common iliac stenting in 2014 and known SFA/ popliteal disease.  CTA showed severe bilateral femoropopliteal PAD with widely patent kissing iliac stents and chronic occlusion of  left renal artery stent.  - Management per Vascular Surgery.  -They are planning angiography likely on Friday after improvement of INR   Aortic valve stenosis History of severe aortic stenosis. He is not a candidate for any intervention and palliative approach was recommended  CKD (chronic kidney disease) stage 3, GFR 30-59 ml/min (HCC) Creatinine at 1.51 today, baseline appears to be around 1.6-1.8. -Monitor renal function -Avoid nephrotoxins  HTN (hypertension) Blood pressure within goal. -Continue home Lasix and metoprolol  COPD (chronic obstructive pulmonary disease) (HCC) No acute concern. -As needed bronchodilator  Hypothyroidism -Continue Synthroid    Subjective: Patient continues to have severe right foot pain.  Currently awaiting vascular procedure today.  No other acute complaints.    Physical Exam: Vitals:   10/14/22 1236 10/14/22 1236 10/14/22 1239 10/14/22 1240  BP:  (!) 152/65  (!) 161/64  Pulse: 61 60  60  Resp:  (!) 26  (!) 27  Temp:      TempSrc:      SpO2:  94% 95% 94%  Weight:      Height:       General exam: awake, alert, no acute distress, chronically ill appearing HEENT: moist mucus membranes, hearing grossly normal  Respiratory system: CTAB, no wheezes, rales or rhonchi, normal respiratory effort. Cardiovascular system: normal S1/S2, RRR, no pedal edema.   Gastrointestinal system: soft, NT, ND, no HSM felt, +bowel sounds. Central nervous system: A&O x3. no gross focal neurologic deficits, normal speech Extremities: right foot gauze dressing clean dry intact with visible darkened nearly black distal toes noted, exquisite tenderness, no edema, normal tone Skin: dry, intact, normal temperature Psychiatry: normal mood, congruent affect, judgement and insight appear normal   Data Reviewed: Prior data reviewed  Family Communication: Wife at bedside on rounds  Disposition: Status is: Inpatient Remains inpatient appropriate because: Severity  of illness with vascular procedure/s planned   Planned Discharge Destination: Home with Home Health  Time spent: 42 minutes  This record has been created using Conservation officer, historic buildings. Errors have been sought and corrected,but may not always be located. Such creation errors do not reflect on the standard of care.   Author: Pennie Banter, DO 10/14/2022 1:05 PM  For on call review [email or web address].

## 2022-10-15 DIAGNOSIS — I70235 Atherosclerosis of native arteries of right leg with ulceration of other part of foot: Secondary | ICD-10-CM | POA: Diagnosis not present

## 2022-10-15 LAB — BASIC METABOLIC PANEL
Anion gap: 10 (ref 5–15)
BUN: 18 mg/dL (ref 8–23)
CO2: 28 mmol/L (ref 22–32)
Calcium: 8.3 mg/dL — ABNORMAL LOW (ref 8.9–10.3)
Chloride: 103 mmol/L (ref 98–111)
Creatinine, Ser: 1.49 mg/dL — ABNORMAL HIGH (ref 0.61–1.24)
GFR, Estimated: 47 mL/min — ABNORMAL LOW (ref >60)
Glucose, Bld: 106 mg/dL — ABNORMAL HIGH (ref 70–99)
Potassium: 3.5 mmol/L (ref 3.5–5.1)
Sodium: 141 mmol/L (ref 135–145)

## 2022-10-15 LAB — CBC
HCT: 43.1 % (ref 39.0–52.0)
Hemoglobin: 13.5 g/dL (ref 13.0–17.0)
MCH: 27.1 pg (ref 26.0–34.0)
MCHC: 31.3 g/dL (ref 30.0–36.0)
MCV: 86.4 fL (ref 80.0–100.0)
Platelets: 375 10*3/uL (ref 150–400)
RBC: 4.99 MIL/uL (ref 4.22–5.81)
RDW: 18.3 % — ABNORMAL HIGH (ref 11.5–15.5)
WBC: 11.5 10*3/uL — ABNORMAL HIGH (ref 4.0–10.5)
nRBC: 0 % (ref 0.0–0.2)

## 2022-10-15 LAB — MAGNESIUM: Magnesium: 2.4 mg/dL (ref 1.7–2.4)

## 2022-10-15 LAB — LIPID PANEL
Cholesterol: 101 mg/dL (ref 0–200)
HDL: 45 mg/dL (ref >40)
LDL Cholesterol: 46 mg/dL (ref 0–99)
Total CHOL/HDL Ratio: 2.2 RATIO
Triglycerides: 51 mg/dL (ref <150)
VLDL: 10 mg/dL (ref 0–40)

## 2022-10-15 LAB — HEPARIN LEVEL (UNFRACTIONATED)
Heparin Unfractionated: 0.62 IU/mL (ref 0.30–0.70)
Heparin Unfractionated: 0.24 IU/mL — ABNORMAL LOW (ref 0.30–0.70)

## 2022-10-15 NOTE — Progress Notes (Signed)
ANTICOAGULATION CONSULT NOTE  Pharmacy Consult for heparin Indication: critical limb ischemia, hx afib/DVT  Allergies  Allergen Reactions   Plavix [Clopidogrel Bisulfate]     Brain hemorrhage prev while on plavix and aspirin    Patient Measurements: Height: 5\' 9"  (175.3 cm) Weight: 68 kg (149 lb 14.6 oz) IBW/kg (Calculated) : 70.7 Heparin Dosing Weight: 68 kg  Vital Signs: Temp: 97.2 F (36.2 C) (06/22 1559) Temp Source: Oral (06/22 1559) BP: 149/66 (06/22 0811)  Labs: Recent Labs    10/13/22 0011 10/13/22 1652 10/14/22 0229 10/14/22 0617 10/14/22 1110 10/15/22 0757 10/15/22 1734  HGB 11.6*  --  12.4*  --   --  13.5  --   HCT 38.7*  --  41.0  --   --  43.1  --   PLT 351  --  347  --   --  375  --   LABPROT 21.3*  --   --  20.3*  --   --   --   INR 1.8*  --   --  1.7*  --   --   --   HEPARINUNFRC  --    < > 0.20*  --  0.22* 0.62 0.24*  CREATININE 1.72*  --  1.48*  --   --  1.49*  --    < > = values in this interval not displayed.     Estimated Creatinine Clearance: 37.4 mL/min (A) (by C-G formula based on SCr of 1.49 mg/dL (H)).   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 06/2011 Echo: EF 35-40%; b. 06/2012 Echo: EF 50%; c. 12/2016 Echo: EF 55-60%, no rwma, GRI DD, mild AS/MR, nl RV fxn, nl RVSP; d. 02/2021 Echo: EF 55%, GrII DD. Mild MR. Mild to mod TR. Sev AS by VTI.   AAA (abdominal aortic aneurysm) (HCC)    a. 05/2020 Abd u/s: 3.6cm.   Aortic calcification (HCC) 05/01/2013   Bilateral renal artery stenosis (HCC)    a. 06/2013 Angio/PTA: LRA: 95 (5x18 Herculink stent), RRA 60ost; b. 04/2021 Angio: mod RRA stenosis w/ patent LRA stent.   Carotid arterial disease (HCC)    a. 05/2020 Carotid U/S: RICA 1-39%, LICA 40-59%   CHF (congestive heart failure) (HCC)    Chronic kidney disease    COPD (chronic obstructive pulmonary disease) (HCC)    Coronary artery disease    a. 2013 s/p  CABG x 3 (LIMA->LAD, VG->OM, VG->RPDA; b. 06/2012 MV: no ischemia; c. 02/2021 Cath: LM 100, RCA 90ost, 114m, free LIMA->LAD nl, VG->OM2 nl, VG->RPDA nl. Mod AS (mean grad , AVA 1.1cm^2). RHC w/ elev filling pressures.   History of prior cigarette smoking 04/11/2008   Qualifier: Diagnosis of  By: Ermalene Searing MD, Amy     Hyperlipidemia    Hypertension    Hypothyroidism    Intraventricular hemorrhage (HCC) 07/12/2012   Ischemic cardiomyopathy    a. 06/2011 Echo: EF 35-40%; b. 06/2012 Echo: EF 50%; c. 12/2016 Echo: EF 55-60%, no rwma, GRI DD; d. 02/2021 Echo: Ef 55%, GrII DD.   Moderate aortic stenosis    a. 02/2021 Echo: EF 55%, GrII DD. Sev Ca2+ of AoV. Sev AS w/ AVA by VTI 0.79cm^2; b. 02/2021 Cath: Mod AS w/ mean grad . AVA 1.1cm^2.   PAF (paroxysmal atrial fibrillation) (HCC)    Peripheral arterial disease (HCC)    a. Previous left lower extremity stenting by Dr. Evette Cristal;  b. 12/2012 s/p  bilat ostial common iliac stenting; c. 04/2021 Angio: Patent LRA stent, mod RRA stenosis. Small AAA. Sev plaque RSFA 2/ subtl occl of inf branch of profunda. Diff Ca2+ SFA dzs w/ collats from profunda and 3V runoff. Diffuse LSFA dzs w/ collats from profunda. Subtl PT dzs. 3V runoff-->Med Rx.   Right leg DVT (HCC) 08/13/2012   Subclavian artery stenosis, left (HCC) 05/2012   Status post stenting of the ostium and self-expanding stent placement to the left axillary artery   Venous insufficiency     Medications:  Medications Prior to Admission  Medication Sig Dispense Refill Last Dose   albuterol (VENTOLIN HFA) 108 (90 Base) MCG/ACT inhaler Inhale 2 puffs into the lungs every 6 (six) hours as needed for wheezing or shortness of breath. 18 g 0 Past Month   amiodarone (PACERONE) 200 MG tablet TAKE 1 TABLET BY MOUTH TWICE A DAY 180 tablet 1 10/11/2022   amoxicillin-clavulanate (AUGMENTIN) 875-125 MG tablet Take 1 tablet by mouth 2 (two) times daily.   10/10/2022   atorvastatin (LIPITOR) 20 MG tablet TAKE 1 TABLET  BY MOUTH EVERY DAY 30 tablet 5 10/11/2022   augmented betamethasone dipropionate (DIPROLENE-AF) 0.05 % cream Apply 1 application  topically 2 (two) times daily as needed (breakouts).   Past Week   empagliflozin (JARDIANCE) 10 MG TABS tablet Take 1 tablet (10 mg total) by mouth daily before breakfast. 30 tablet 3 10/11/2022   furosemide (LASIX) 40 MG tablet Take 1 tablet (40 mg total) by mouth 2 (two) times daily. 60 tablet 3 10/11/2022   levothyroxine (SYNTHROID) 150 MCG tablet TAKE ONE TABLET BY MOUTH EVERY DAY BEFORE BREAKFAST (Patient taking differently: Take 150 mcg by mouth daily before breakfast. TAKE ONE TABLET BY MOUTH EVERY DAY BEFORE BREAKFAST) 90 tablet 3 10/11/2022   metoprolol succinate (TOPROL XL) 25 MG 24 hr tablet Take 1 tablet (25 mg total) by mouth 2 (two) times daily. 60 tablet 3 10/11/2022 at 8 am   warfarin (COUMADIN) 5 MG tablet Take 2.5 mg by mouth See admin instructions. Take 2.5 mg by mouth once daily except Saturday.   10/10/2022 at 6 pm   Scheduled:   amiodarone  200 mg Oral BID   atorvastatin  20 mg Oral Daily   furosemide  40 mg Oral BID   levothyroxine  150 mcg Oral Q0600   metoprolol succinate  25 mg Oral BID   sodium chloride flush  3 mL Intravenous Q12H   Assessment: 82 yom with hx afib/DVT s/p IVC filter on warfarin PTA presenting with critical limb ischemia. Pharmacy consulted to dose heparin when INR <2. Warfarin held. INR 2.5 on presentation. Patient previously on warfarin 2.5 mg daily except no dose on Sat. CBC stable. No bleed issues reported. Continues on PTA amiodarone inpatient.  INR 1.7 - down a bit. SCr down to 1.49.  Heparin level 0.24 units/mL slightly low on heparin 1650 units/hr this am. No issues with infusion or bleeding noted.   Goal of Therapy:  Heparin level 0.3-0.7 units/ml Monitor platelets by anticoagulation protocol: Yes   Plan:  Increase  heparin to 1700 units/h  Check heparin level and CBC daily  Monitor for s/sx of bleeding       Leota Sauers Pharm.D. CPP, BCPS Clinical Pharmacist [phone] 10/15/2022 7:16 PM

## 2022-10-15 NOTE — Progress Notes (Signed)
  VASCULAR SURGERY ASSESSMENT & PLAN:   PERIPHERAL ARTERIAL DISEASE WITH GANGRENE RIGHT FOOT: Without revascularization he will certainly require primary amputation.  He would like to try to save the foot if at all possible.  Based on his arteriogram yesterday, his only option for revascularization is right femoral endarterectomy and a right femoral to below-knee popliteal artery bypass with PTFE.  The popliteal artery and tibial vessels are severely calcified and may not be syllable.  However, I have explained that I would do a right femoral endarterectomy, and then explore the below-knee popliteal artery to see if it is amenable to bypass.  He is clearly at high risk for limb loss but wants to try everything possible to save his limb.  He understands that he is at increased risk because of his cardiac history as documented by cardiology.  His surgery is scheduled for Tuesday.  ANTICOAGULATION: His Coumadin is on hold and he is on intravenous heparin.  SUBJECTIVE:   Moderate pain right foot  PHYSICAL EXAM:   Vitals:   10/14/22 1920 10/14/22 2118 10/14/22 2357 10/15/22 0603  BP: (!) 126/48 (!) 111/50 (!) 123/56 (!) 147/68  Pulse: 74 69 62 62  Resp: (!) 24  (!) 22 (!) 24  Temp: (!) 97.5 F (36.4 C)  97.8 F (36.6 C) (!) 97.4 F (36.3 C)  TempSrc: Oral  Oral Oral  SpO2: 98%  91% 94%  Weight:      Height:       Dry gangrene of multiple toes of the right foot as documented in the photographs below.      LABS:   Lab Results  Component Value Date   WBC 11.7 (H) 10/14/2022   HGB 12.4 (L) 10/14/2022   HCT 41.0 10/14/2022   MCV 85.8 10/14/2022   PLT 347 10/14/2022   Lab Results  Component Value Date   CREATININE 1.48 (H) 10/14/2022   Lab Results  Component Value Date   INR 1.7 (H) 10/14/2022   CBG (last 3)  Recent Labs    10/12/22 0751  GLUCAP 93    PROBLEM LIST:    Principal Problem:   Critical limb ischemia of right lower extremity with ulceration of foot  (HCC) Active Problems:   Hypothyroidism   Abdominal aortic aneurysm (HCC)   COPD (chronic obstructive pulmonary disease) (HCC)   Ischemic cardiomyopathy   Peripheral arterial disease (HCC)   HTN (hypertension)   CKD (chronic kidney disease) stage 3, GFR 30-59 ml/min (HCC)   PAF (paroxysmal atrial fibrillation) (HCC)   DVT (deep venous thrombosis) (HCC)   Aortic valve stenosis   CURRENT MEDS:    amiodarone  200 mg Oral BID   atorvastatin  20 mg Oral Daily   furosemide  40 mg Oral BID   levothyroxine  150 mcg Oral Q0600   metoprolol succinate  25 mg Oral BID   sodium chloride flush  3 mL Intravenous Q12H    Waverly Ferrari Office: [phone] 10/15/2022

## 2022-10-15 NOTE — Plan of Care (Signed)

## 2022-10-15 NOTE — Progress Notes (Signed)
ANTICOAGULATION CONSULT NOTE  Pharmacy Consult for heparin Indication: critical limb ischemia, hx afib/DVT  Allergies  Allergen Reactions   Plavix [Clopidogrel Bisulfate]     Brain hemorrhage prev while on plavix and aspirin    Patient Measurements: Height: 5\' 9"  (175.3 cm) Weight: 68 kg (149 lb 14.6 oz) IBW/kg (Calculated) : 70.7 Heparin Dosing Weight: 68 kg  Vital Signs: Temp: 97.2 F (36.2 C) (06/22 0811) Temp Source: Oral (06/22 0811) BP: 149/66 (06/22 0811) Pulse Rate: 62 (06/22 0603)  Labs: Recent Labs    10/13/22 0011 10/13/22 1652 10/14/22 0229 10/14/22 0617 10/14/22 1110 10/15/22 0757  HGB 11.6*  --  12.4*  --   --  13.5  HCT 38.7*  --  41.0  --   --  43.1  PLT 351  --  347  --   --  375  LABPROT 21.3*  --   --  20.3*  --   --   INR 1.8*  --   --  1.7*  --   --   HEPARINUNFRC  --    < > 0.20*  --  0.22* 0.62  CREATININE 1.72*  --  1.48*  --   --  1.49*   < > = values in this interval not displayed.     Estimated Creatinine Clearance: 37.4 mL/min (A) (by C-G formula based on SCr of 1.49 mg/dL (H)).   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 06/2011 Echo: EF 35-40%; b. 06/2012 Echo: EF 50%; c. 12/2016 Echo: EF 55-60%, no rwma, GRI DD, mild AS/MR, nl RV fxn, nl RVSP; d. 02/2021 Echo: EF 55%, GrII DD. Mild MR. Mild to mod TR. Sev AS by VTI.   AAA (abdominal aortic aneurysm) (HCC)    a. 05/2020 Abd u/s: 3.6cm.   Aortic calcification (HCC) 05/01/2013   Bilateral renal artery stenosis (HCC)    a. 06/2013 Angio/PTA: LRA: 95 (5x18 Herculink stent), RRA 60ost; b. 04/2021 Angio: mod RRA stenosis w/ patent LRA stent.   Carotid arterial disease (HCC)    a. 05/2020 Carotid U/S: RICA 1-39%, LICA 40-59%   CHF (congestive heart failure) (HCC)    Chronic kidney disease    COPD (chronic obstructive pulmonary disease) (HCC)    Coronary artery disease    a. 2013 s/p CABG x 3 (LIMA->LAD,  VG->OM, VG->RPDA; b. 06/2012 MV: no ischemia; c. 02/2021 Cath: LM 100, RCA 90ost, 172m, free LIMA->LAD nl, VG->OM2 nl, VG->RPDA nl. Mod AS (mean grad , AVA 1.1cm^2). RHC w/ elev filling pressures.   History of prior cigarette smoking 04/11/2008   Qualifier: Diagnosis of  By: Ermalene Searing MD, Amy     Hyperlipidemia    Hypertension    Hypothyroidism    Intraventricular hemorrhage (HCC) 07/12/2012   Ischemic cardiomyopathy    a. 06/2011 Echo: EF 35-40%; b. 06/2012 Echo: EF 50%; c. 12/2016 Echo: EF 55-60%, no rwma, GRI DD; d. 02/2021 Echo: Ef 55%, GrII DD.   Moderate aortic stenosis    a. 02/2021 Echo: EF 55%, GrII DD. Sev Ca2+ of AoV. Sev AS w/ AVA by VTI 0.79cm^2; b. 02/2021 Cath: Mod AS w/ mean grad . AVA 1.1cm^2.   PAF (paroxysmal atrial fibrillation) (HCC)    Peripheral arterial disease (HCC)    a. Previous left lower extremity stenting by Dr. Evette Cristal;  b. 12/2012 s/p bilat ostial common iliac stenting; c. 04/2021 Angio: Patent LRA stent, mod RRA stenosis. Small AAA.  Sev plaque RSFA 2/ subtl occl of inf branch of profunda. Diff Ca2+ SFA dzs w/ collats from profunda and 3V runoff. Diffuse LSFA dzs w/ collats from profunda. Subtl PT dzs. 3V runoff-->Med Rx.   Right leg DVT (HCC) 08/13/2012   Subclavian artery stenosis, left (HCC) 05/2012   Status post stenting of the ostium and self-expanding stent placement to the left axillary artery   Venous insufficiency     Medications:  Medications Prior to Admission  Medication Sig Dispense Refill Last Dose   albuterol (VENTOLIN HFA) 108 (90 Base) MCG/ACT inhaler Inhale 2 puffs into the lungs every 6 (six) hours as needed for wheezing or shortness of breath. 18 g 0 Past Month   amiodarone (PACERONE) 200 MG tablet TAKE 1 TABLET BY MOUTH TWICE A DAY 180 tablet 1 10/11/2022   amoxicillin-clavulanate (AUGMENTIN) 875-125 MG tablet Take 1 tablet by mouth 2 (two) times daily.   10/10/2022   atorvastatin (LIPITOR) 20 MG tablet TAKE 1 TABLET BY MOUTH EVERY DAY  30 tablet 5 10/11/2022   augmented betamethasone dipropionate (DIPROLENE-AF) 0.05 % cream Apply 1 application  topically 2 (two) times daily as needed (breakouts).   Past Week   empagliflozin (JARDIANCE) 10 MG TABS tablet Take 1 tablet (10 mg total) by mouth daily before breakfast. 30 tablet 3 10/11/2022   furosemide (LASIX) 40 MG tablet Take 1 tablet (40 mg total) by mouth 2 (two) times daily. 60 tablet 3 10/11/2022   levothyroxine (SYNTHROID) 150 MCG tablet TAKE ONE TABLET BY MOUTH EVERY DAY BEFORE BREAKFAST (Patient taking differently: Take 150 mcg by mouth daily before breakfast. TAKE ONE TABLET BY MOUTH EVERY DAY BEFORE BREAKFAST) 90 tablet 3 10/11/2022   metoprolol succinate (TOPROL XL) 25 MG 24 hr tablet Take 1 tablet (25 mg total) by mouth 2 (two) times daily. 60 tablet 3 10/11/2022 at 8 am   warfarin (COUMADIN) 5 MG tablet Take 2.5 mg by mouth See admin instructions. Take 2.5 mg by mouth once daily except Saturday.   10/10/2022 at 6 pm   Scheduled:   amiodarone  200 mg Oral BID   atorvastatin  20 mg Oral Daily   furosemide  40 mg Oral BID   levothyroxine  150 mcg Oral Q0600   metoprolol succinate  25 mg Oral BID   sodium chloride flush  3 mL Intravenous Q12H   Assessment: 82 yom with hx afib/DVT s/p IVC filter on warfarin PTA presenting with critical limb ischemia. Pharmacy consulted to dose heparin when INR <2. Warfarin held. INR 2.5 on presentation. Patient previously on warfarin 2.5 mg daily except no dose on Sat. CBC stable. No bleed issues reported. Continues on PTA amiodarone inpatient.  INR 1.7 - down a bit. SCr down to 1.49.  Heparin level 0.62 units/mL on heparin 1700 units/hr this am. Will decrease slightly to remain within therapeutic range. No issues with infusion or bleeding noted.   Goal of Therapy:  Heparin level 0.3-0.7 units/ml Monitor platelets by anticoagulation protocol: Yes   Plan:  Decrease heparin to 1650 units/h  Check 8 hour heparin level  Check heparin level  and CBC daily  Monitor for s/sx of bleeding  F/u vascular plans   Andreas Ohm, PharmD Pharmacy Resident  10/15/2022 9:43 AM

## 2022-10-15 NOTE — Progress Notes (Signed)
PROGRESS NOTE    [name]  UJW:[id number] DOB: [birth year] DOA: 10/11/2022 PCP: [name], MD   Brief Narrative:  [name] is a 82 y.o. male with medical history significant of PVD status post left subclavian artery and left axillary artery stenting,, CAD status post CABG, CKD stage IIIb, severe AO, PAF and history of DVT on Coumadin and IVC filter, chronic HFrEF presented with worsening of right foot ulcer pain and discoloration.   6/19: Cardiology surgical clearance. INR elevated-Coumadin held, vitamin K given per surgery. 6/20: Zosyn initiated, amiodarone metoprolol and Lasix on hold 6/21: Tentative plan for right common femoral artery endarterectomy with below-knee popliteal artery bypass with prosthetic graft planned for 10/18/2022  Assessment & Plan:   Principal Problem:   Critical limb ischemia of right lower extremity with ulceration of foot (HCC) Active Problems:   Abdominal aortic aneurysm (HCC)   PAF (paroxysmal atrial fibrillation) (HCC)   DVT (deep venous thrombosis) (HCC)   Ischemic cardiomyopathy   Peripheral arterial disease (HCC)   Aortic valve stenosis   CKD (chronic kidney disease) stage 3, GFR 30-59 ml/min (HCC)   HTN (hypertension)   COPD (chronic obstructive pulmonary disease) (HCC)   Hypothyroidism   Critical limb ischemia of right lower extremity with ulceration of foot (HCC) -Vascular surgery following, tentative plan for surgery 6/25 with prosthetic graft -Angiography confirmed poor vasculature -Patient high risk for surgery based on HFrEF, severe aortic stenosis and CAD. -Continue Zosyn (started 6/20) -Continue with pain management   Abdominal aortic aneurysm (HCC) 5.5 cm on recent imaging.  Enlarging. Vascular surgery is on board and does not think that he will be a candidate for endovascular repair, likely will require open surgery which is going to be very high risk. -Observe for now -repeat imaging and follow-up per vascular    PAF (paroxysmal atrial fibrillation) (HCC) currently sinus rhythm -Failed DCCV in January 2024 with early return in atrial fibrillation.  -Amiodarone, repeat cardioversion February 24 -Metoprolol ongoing, transiently held due to bradycardia -Amiodarone 200mg  daily.  -On chronic anticoagulation with Coumadin -on hold perioperatively  DVT (deep venous thrombosis) (HCC) History of DVT.  Recent vascular ultrasound was negative for DVT in right lower extremity. -Coumadin is under hold for procedure   Ischemic cardiomyopathy Echo January 2024 with EF of 40 to 45%.   Euvolemic on exam. -Continue Lasix, metoprolol -no ACE/ARB/Arni in the setting of renal function -Jardiance on hold perioperatively  Peripheral arterial disease (HCC) -Chronic history -Prior bilateral, iliac stenting -Aortogram shows severe bilateral femoral-popliteal artery disease -with prior stenting, chronic occlusion of left renal artery stent noted.  .  Aortic valve stenosis History of severe aortic stenosis. Not a candidate for repair/replacement given high risk   CKD (chronic kidney disease) stage 3b, GFR 30-59 ml/min (HCC) Creatinine at 1.51 today, baseline appears to be around 1.6-1.8. -Monitor renal function   HTN (hypertension) Blood pressure within goal. -Continue home Lasix and metoprolol   COPD (chronic obstructive pulmonary disease) (HCC) No acute concern. -As needed bronchodilator   Hypothyroidism -Continue Synthroid   DVT prophylaxis: Heparin drip  Code Status:   Code Status: Full Code Family Communication: None present  Status is: Inpt  Dispo: The patient is from: Home              Anticipated d/c is to: To be determined              Anticipated d/c date is: Pending surgical evaluation  Patient currently not medically stable for discharge given ongoing need for further evaluation and treatment and planned surgery as above  Consultants:  Vascular surgery  Procedures:   Aortogram 6/21 Tentatively planned  Antimicrobials:  Perioperatively  Subjective: No acute issues or events overnight denies nausea vomiting diarrhea constipation any fevers chills or chest pain  Objective: Vitals:   10/14/22 1920 10/14/22 2118 10/14/22 2357 10/15/22 0603  BP: (!) 126/48 (!) 111/50 (!) 123/56 (!) 147/68  Pulse: 74 69 62 62  Resp: (!) 24  (!) 22 (!) 24  Temp: (!) 97.5 F (36.4 C)  97.8 F (36.6 C) (!) 97.4 F (36.3 C)  TempSrc: Oral  Oral Oral  SpO2: 98%  91% 94%  Weight:      Height:        Intake/Output Summary (Last 24 hours) at 10/15/2022 0731 Last data filed at 10/15/2022 1027 Gross per 24 hour  Intake 1424.02 ml  Output 1550 ml  Net -125.98 ml   Filed Weights   10/11/22 1110  Weight: 68 kg    Examination:  General:  Pleasantly resting in bed, No acute distress. HEENT:  Normocephalic atraumatic.  Sclerae nonicteric, noninjected.  Extraocular movements intact bilaterally. Neck:  Without mass or deformity.  Trachea is midline. Lungs:  Clear to auscultate bilaterally without rhonchi, wheeze, or rales. Heart:  Regular rate and rhythm.  Without murmurs, rubs, or gallops. Abdomen:  Soft, nontender, nondistended.  Without guarding or rebound. Extremities: Right foot multiple toes dark, markedly painful to palpate Skin:  Warm and dry, no erythema -dusky right foot  Data Reviewed: I have personally reviewed following labs and imaging studies  CBC: Recent Labs  Lab 10/11/22 1056 10/11/22 1205 10/12/22 0044 10/13/22 0011 10/14/22 0229  WBC 10.2  --  8.6 11.1* 11.7*  NEUTROABS 7.8*  --   --   --   --   HGB 12.4* 13.6 11.9* 11.6* 12.4*  HCT 40.7 40.0 38.6* 38.7* 41.0  MCV 86.4  --  87.9 86.8 85.8  PLT 336  --  317 351 347   Basic Metabolic Panel: Recent Labs  Lab 10/11/22 1056 10/11/22 1205 10/12/22 0044 10/13/22 0011 10/14/22 0229  NA 137 138 138 134* 138  K 3.5 3.7 3.4* 3.4* 3.7  CL 100 103 103 101 103  CO2 26  --  26 25 26    GLUCOSE 89 85 91 105* 108*  BUN 20 24* 19 20 18   CREATININE 1.62* 1.60* 1.51* 1.72* 1.48*  CALCIUM 8.3*  --  8.1* 8.0* 8.1*   GFR: Estimated Creatinine Clearance: 37.7 mL/min (A) (by C-G formula based on SCr of 1.48 mg/dL (H)). Liver Function Tests: No results for input(s): "AST", "ALT", "ALKPHOS", "BILITOT", "PROT", "ALBUMIN" in the last 168 hours. No results for input(s): "LIPASE", "AMYLASE" in the last 168 hours. No results for input(s): "AMMONIA" in the last 168 hours. Coagulation Profile: Recent Labs  Lab 10/11/22 1056 10/12/22 0044 10/13/22 0011 10/14/22 0617  INR 2.5* 2.8* 1.8* 1.7*   Cardiac Enzymes: No results for input(s): "CKTOTAL", "CKMB", "CKMBINDEX", "TROPONINI" in the last 168 hours. BNP (last 3 results) No results for input(s): "PROBNP" in the last 8760 hours. HbA1C: No results for input(s): "HGBA1C" in the last 72 hours. CBG: Recent Labs  Lab 10/11/22 1657 10/11/22 1742 10/12/22 0006 10/12/22 0751  GLUCAP 119* 120* 86 93   Lipid Profile: No results for input(s): "CHOL", "HDL", "LDLCALC", "TRIG", "CHOLHDL", "LDLDIRECT" in the last 72 hours. Thyroid Function Tests: No results for input(s): "TSH", "  T4TOTAL", "FREET4", "T3FREE", "THYROIDAB" in the last 72 hours. Anemia Panel: No results for input(s): "VITAMINB12", "FOLATE", "FERRITIN", "TIBC", "IRON", "RETICCTPCT" in the last 72 hours. Sepsis Labs: No results for input(s): "PROCALCITON", "LATICACIDVEN" in the last 168 hours.  No results found for this or any previous visit (from the past 240 hour(s)).       Radiology Studies: PERIPHERAL VASCULAR CATHETERIZATION  Result Date: 10/14/2022 Table formatting from the original result was not included. Images from the original result were not included.  PATIENT: VETO MACQUEEN                                                                  MRN: [id number] DOB: 08-Feb-1941                                                DATE OF PROCEDURE: 10/14/2022  INDICATIONS:    OSBORNE SERIO is a 82 y.o. male who presents with a nonhealing wound on the right foot.  He had evidence of diffuse infrainguinal arterial occlusive disease.  He presents for arteriography.  PROCEDURE:   Conscious sedation Ultrasound-guided access to the right common femoral artery Aortogram with bilateral iliac arteriogram Retrograde right femoral arteriogram with right lower extremity runoff  SURGEON: Di Kindle. [name], MD, FACS  ANESTHESIA: Local with sedation  EBL: Minimal  TECHNIQUE: The patient was brought to the peripheral vascular lab and was sedated. The period of conscious sedation was 40 minutes.  During that time period, I was present face-to-face 100% of the time.  The patient was administered 1 mg of Versed. The patient's heart rate, blood pressure, and oxygen saturation were monitored by the nurse continuously during the procedure.  Both groins were prepped and draped in the usual sterile fashion.  I looked with the SonoSite at the right common femoral artery.  Of note the patient had iliac stents that extended into the infrarenal aorta so I did not think a contralateral approach could be used.  There was a bulky calcific plaque in the posterior and medial aspect of the common femoral artery.  In order to get above that, I had to stick fairly high on the common femoral artery.  This was done under ultrasound guidance after the skin was anesthetized.  The artery was cannulated with a micropuncture needle and a micropuncture sheath introduced over the wire.  I then injected through the micropuncture sheath to determine that the stick was not too high and I thought that it was just below the inguinal ligament.  The micropuncture sheath was exchanged for a 5 French sheath over a Bentson wire.  By ultrasound the femoral artery was patent. A real-time image was obtained and sent to the server.  The pigtail catheter was positioned in the distal infrarenal aorta.  Of note I was trying to conserve  contrast given his mild renal insufficiency.  Therefore I did not study the renals.  Although the iliac system was open on the right the arteries were small and the catheter itself was essentially occlusive.  I then removed the pigtail catheter over a wire and a retrograde right femoral arteriogram  was obtained to the right femoral sheath.  The wire was then removed.  Next a retrograde right femoral arteriogram was obtained with right lower extremity runoff.  We did this in stages as the patient was moving around quite a bit.  At the completion of the procedure the patient was transferred to the holding area for removal of the sheath.  No immediate complications were noted.  FINDINGS:  The right common iliac artery stent is widely patent.  The right external iliac artery and hypogastric arteries are patent.  The arteries are small with diffuse calcific disease.  The right common femoral artery has a bulky calcific plaque on the posterior and medial aspect of the artery.  The deep femoral artery is patent although small.  The superficial femoral artery is occluded at its origin.  There is reconstitution below that however there is severe diffuse disease throughout the entire right superficial femoral artery and popliteal artery.  There is flow in the below-knee popliteal artery although this artery 2 is markedly calcified and this was also seen on CT scan. Below that there is severe diffuse tibial disease.  The anterior tibial artery is heavily calcified all the way down to the ankle.  The dorsalis pedis is not visualized.  The peroneal artery has severe diffuse disease.  The posterior tibial artery is severely calcified and diseased and occludes above the ankle.   CLINICAL NOTE: This patient has a nonhealing wound of the right foot with severe infrainguinal arterial occlusive disease.  Even with revascularization I think he is at significant risk for limb loss.  He feels strongly about trying to save the limb.  He  is not a candidate for an endovascular approach.  I think the only option would be right common femoral artery endarterectomy and a right femoral to below-knee popliteal artery bypass with a prosthetic graft.  I am not sure that I could sew to the femoral artery given the amount of's calcific disease however there is no other targets based on his arteriogram and the amount of calcific disease.  His veins were mapped were not adequate for an autogenous bypass.  I will discuss this with him further over the weekend and discussed the options of primary amputation versus attempted right femoral endarterectomy with right femoral to below-knee popliteal artery bypass with PTFE.  Waverly Ferrari, MD, FACS [facility]     Scheduled Meds:  amiodarone  200 mg Oral BID   atorvastatin  20 mg Oral Daily   furosemide  40 mg Oral BID   levothyroxine  150 mcg Oral Q0600   metoprolol succinate  25 mg Oral BID   sodium chloride flush  3 mL Intravenous Q12H   Continuous Infusions:  sodium chloride     heparin 1,700 Units/hr (10/15/22 0606)   piperacillin-tazobactam (ZOSYN)  IV 3.375 g (10/15/22 0602)     LOS: 4 days   Time spent:  Azucena Fallen, DO [facility]  If 7PM-7AM, please contact night-coverage [email or web address]  10/15/2022, 7:31 AM

## 2022-10-16 ENCOUNTER — Other Ambulatory Visit: Payer: Self-pay | Admitting: Internal Medicine

## 2022-10-16 DIAGNOSIS — I70235 Atherosclerosis of native arteries of right leg with ulceration of other part of foot: Secondary | ICD-10-CM | POA: Diagnosis not present

## 2022-10-16 LAB — BASIC METABOLIC PANEL
Anion gap: 10 (ref 5–15)
BUN: 19 mg/dL (ref 8–23)
CO2: 25 mmol/L (ref 22–32)
Calcium: 8.1 mg/dL — ABNORMAL LOW (ref 8.9–10.3)
Chloride: 104 mmol/L (ref 98–111)
Creatinine, Ser: 1.41 mg/dL — ABNORMAL HIGH (ref 0.61–1.24)
GFR, Estimated: 50 mL/min — ABNORMAL LOW (ref >60)
Glucose, Bld: 94 mg/dL (ref 70–99)
Potassium: 3.2 mmol/L — ABNORMAL LOW (ref 3.5–5.1)
Sodium: 139 mmol/L (ref 135–145)

## 2022-10-16 LAB — CBC
HCT: 41.1 % (ref 39.0–52.0)
Hemoglobin: 12.9 g/dL — ABNORMAL LOW (ref 13.0–17.0)
MCH: 26.4 pg (ref 26.0–34.0)
MCHC: 31.4 g/dL (ref 30.0–36.0)
MCV: 84.2 fL (ref 80.0–100.0)
Platelets: 372 10*3/uL (ref 150–400)
RBC: 4.88 MIL/uL (ref 4.22–5.81)
RDW: 18.1 % — ABNORMAL HIGH (ref 11.5–15.5)
WBC: 9.6 10*3/uL (ref 4.0–10.5)
nRBC: 0 % (ref 0.0–0.2)

## 2022-10-16 LAB — HEPARIN LEVEL (UNFRACTIONATED)
Heparin Unfractionated: <0.1 IU/mL — ABNORMAL LOW (ref 0.30–0.70)
Heparin Unfractionated: 0.32 IU/mL (ref 0.30–0.70)
Heparin Unfractionated: 0.36 IU/mL (ref 0.30–0.70)

## 2022-10-16 MED ORDER — POTASSIUM CHLORIDE CRYS ER 20 MEQ PO TBCR
40.0000 meq | EXTENDED_RELEASE_TABLET | Freq: Every day | ORAL | Status: DC
  Administered 2022-10-16 – 2022-10-21 (×5): 40 meq via ORAL
  Filled 2022-10-16 (×5): qty 2

## 2022-10-16 NOTE — Progress Notes (Signed)
ANTICOAGULATION CONSULT NOTE  Pharmacy Consult for heparin Indication: critical limb ischemia, hx afib/DVT  Allergies  Allergen Reactions   Plavix [Clopidogrel Bisulfate]     Brain hemorrhage prev while on plavix and aspirin    Patient Measurements: Height: 5\' 9"  (175.3 cm) Weight: 68 kg (149 lb 14.6 oz) IBW/kg (Calculated) : 70.7 Heparin Dosing Weight: 68 kg  Vital Signs: Temp: 97.4 F (36.3 C) (06/23 0308) Temp Source: Oral (06/23 0308) BP: 145/60 (06/23 1200) Pulse Rate: 58 (06/23 0828)  Labs: Recent Labs    10/14/22 0229 10/14/22 0617 10/14/22 1110 10/15/22 0757 10/15/22 1734 10/16/22 0303 10/16/22 1114  HGB 12.4*  --   --  13.5  --  12.9*  --   HCT 41.0  --   --  43.1  --  41.1  --   PLT 347  --   --  375  --  372  --   LABPROT  --  20.3*  --   --   --   --   --   INR  --  1.7*  --   --   --   --   --   HEPARINUNFRC 0.20*  --    < > 0.62 0.24* <0.10* 0.32  CREATININE 1.48*  --   --  1.49*  --  1.41*  --    < > = values in this interval not displayed.     Estimated Creatinine Clearance: 39.5 mL/min (A) (by C-G formula based on SCr of 1.41 mg/dL (H)).   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 06/2011 Echo: EF 35-40%; b. 06/2012 Echo: EF 50%; c. 12/2016 Echo: EF 55-60%, no rwma, GRI DD, mild AS/MR, nl RV fxn, nl RVSP; d. 02/2021 Echo: EF 55%, GrII DD. Mild MR. Mild to mod TR. Sev AS by VTI.   AAA (abdominal aortic aneurysm) (HCC)    a. 05/2020 Abd u/s: 3.6cm.   Aortic calcification (HCC) 05/01/2013   Bilateral renal artery stenosis (HCC)    a. 06/2013 Angio/PTA: LRA: 95 (5x18 Herculink stent), RRA 60ost; b. 04/2021 Angio: mod RRA stenosis w/ patent LRA stent.   Carotid arterial disease (HCC)    a. 05/2020 Carotid U/S: RICA 1-39%, LICA 40-59%   CHF (congestive heart failure) (HCC)    Chronic kidney disease    COPD (chronic obstructive pulmonary disease) (HCC)    Coronary  artery disease    a. 2013 s/p CABG x 3 (LIMA->LAD, VG->OM, VG->RPDA; b. 06/2012 MV: no ischemia; c. 02/2021 Cath: LM 100, RCA 90ost, 135m, free LIMA->LAD nl, VG->OM2 nl, VG->RPDA nl. Mod AS (mean grad , AVA 1.1cm^2). RHC w/ elev filling pressures.   History of prior cigarette smoking 04/11/2008   Qualifier: Diagnosis of  By: Ermalene Searing MD, Amy     Hyperlipidemia    Hypertension    Hypothyroidism    Intraventricular hemorrhage (HCC) 07/12/2012   Ischemic cardiomyopathy    a. 06/2011 Echo: EF 35-40%; b. 06/2012 Echo: EF 50%; c. 12/2016 Echo: EF 55-60%, no rwma, GRI DD; d. 02/2021 Echo: Ef 55%, GrII DD.   Moderate aortic stenosis    a. 02/2021 Echo: EF 55%, GrII DD. Sev Ca2+ of AoV. Sev AS w/ AVA by VTI 0.79cm^2; b. 02/2021 Cath: Mod AS w/ mean grad . AVA 1.1cm^2.   PAF (paroxysmal atrial fibrillation) (HCC)    Peripheral arterial disease (HCC)    a. Previous left lower extremity stenting  by Dr. Evette Cristal;  b. 12/2012 s/p bilat ostial common iliac stenting; c. 04/2021 Angio: Patent LRA stent, mod RRA stenosis. Small AAA. Sev plaque RSFA 2/ subtl occl of inf branch of profunda. Diff Ca2+ SFA dzs w/ collats from profunda and 3V runoff. Diffuse LSFA dzs w/ collats from profunda. Subtl PT dzs. 3V runoff-->Med Rx.   Right leg DVT (HCC) 08/13/2012   Subclavian artery stenosis, left (HCC) 05/2012   Status post stenting of the ostium and self-expanding stent placement to the left axillary artery   Venous insufficiency     Medications:  Medications Prior to Admission  Medication Sig Dispense Refill Last Dose   albuterol (VENTOLIN HFA) 108 (90 Base) MCG/ACT inhaler Inhale 2 puffs into the lungs every 6 (six) hours as needed for wheezing or shortness of breath. 18 g 0 Past Month   amiodarone (PACERONE) 200 MG tablet TAKE 1 TABLET BY MOUTH TWICE A DAY 180 tablet 1 10/11/2022   [EXPIRED] amoxicillin-clavulanate (AUGMENTIN) 875-125 MG tablet Take 1 tablet by mouth 2 (two) times daily.   10/10/2022    atorvastatin (LIPITOR) 20 MG tablet TAKE 1 TABLET BY MOUTH EVERY DAY 30 tablet 5 10/11/2022   augmented betamethasone dipropionate (DIPROLENE-AF) 0.05 % cream Apply 1 application  topically 2 (two) times daily as needed (breakouts).   Past Week   empagliflozin (JARDIANCE) 10 MG TABS tablet Take 1 tablet (10 mg total) by mouth daily before breakfast. 30 tablet 3 10/11/2022   furosemide (LASIX) 40 MG tablet Take 1 tablet (40 mg total) by mouth 2 (two) times daily. 60 tablet 3 10/11/2022   levothyroxine (SYNTHROID) 150 MCG tablet TAKE ONE TABLET BY MOUTH EVERY DAY BEFORE BREAKFAST (Patient taking differently: Take 150 mcg by mouth daily before breakfast. TAKE ONE TABLET BY MOUTH EVERY DAY BEFORE BREAKFAST) 90 tablet 3 10/11/2022   metoprolol succinate (TOPROL XL) 25 MG 24 hr tablet Take 1 tablet (25 mg total) by mouth 2 (two) times daily. 60 tablet 3 10/11/2022 at 8 am   warfarin (COUMADIN) 5 MG tablet Take 2.5 mg by mouth See admin instructions. Take 2.5 mg by mouth once daily except Saturday.   10/10/2022 at 6 pm   Scheduled:   amiodarone  200 mg Oral BID   atorvastatin  20 mg Oral Daily   furosemide  40 mg Oral BID   levothyroxine  150 mcg Oral Q0600   metoprolol succinate  25 mg Oral BID   sodium chloride flush  3 mL Intravenous Q12H   Assessment: 82 yom with hx afib/DVT s/p IVC filter on warfarin PTA presenting with critical limb ischemia. Pharmacy consulted to dose heparin when INR <2. Warfarin held. INR 2.5 on presentation. Patient previously on warfarin 2.5 mg daily except no dose on Sat. CBC stable. No bleed issues reported. Continues on PTA amiodarone inpatient.  INR 1.7 - down a bit. SCr down to 1.41.  RN reported that the patient became tangled in IV lines overnight and did not let them know that IV heparin line had come out. Heparin was resumed when found around 3 am. As anticipated, heparin level was undetectable this morning due to the pause in therapy. Heparin level now therapeutic at  0.32 units/mL on heparin 1700 units/hr. No other issues with infusion or bleeding noted per RN.   Goal of Therapy:  Heparin level 0.3-0.7 units/ml Monitor platelets by anticoagulation protocol: Yes   Plan:  Continue heparin 1700 units/h  Check heparin level in 8 hours  Check heparin level and CBC  daily  Monitor for s/sx of bleeding   Andreas Ohm, PharmD Pharmacy Resident  10/16/2022 12:15 PM

## 2022-10-16 NOTE — Progress Notes (Signed)
PHARMACIST LIPID MONITORING   [name] is a 82 y.o. male admitted on 10/11/2022 with PAD with gangrene right foot.  Pharmacy has been consulted to optimize lipid-lowering therapy with the indication of secondary prevention for clinical ASCVD.  Recent Labs:  Lipid Panel (last 6 months):   Lab Results  Component Value Date   CHOL 101 10/15/2022   TRIG 51 10/15/2022   HDL 45 10/15/2022   CHOLHDL 2.2 10/15/2022   VLDL 10 10/15/2022   LDLCALC 46 10/15/2022    Hepatic function panel (last 6 months):   Lab Results  Component Value Date   AST 24 09/28/2022   ALT 18 09/28/2022   ALKPHOS 93 09/28/2022   BILITOT 1.1 09/28/2022   BILIDIR 0.2 08/04/2022    SCr (since admission):   Serum creatinine: 1.41 mg/dL (H) 47/82/95 6213 Estimated creatinine clearance: 39.5 mL/min (A)  Current therapy and lipid therapy tolerance Current lipid-lowering therapy: atorvastatin 20 mg daily  Documented or reported allergies or intolerances to lipid-lowering therapies: None  Assessment:   Patient denies intolerances to lipid-lowering therapies. LDL below goal. Per discussion with patient and his wife, they are not agreeable to changing lipid-lowering therapy at this time.    Plan:   No changes to current lipid-lowering therapy per patient's request    Andreas Ohm, PharmD Pharmacy Resident  10/16/2022 1:44 PM

## 2022-10-16 NOTE — Progress Notes (Signed)
  VASCULAR SURGERY ASSESSMENT & PLAN:   PERIPHERAL ARTERIAL DISEASE WITH GANGRENE RIGHT FOOT:  He is clearly at high risk for limb loss but wants to try everything possible to save his limb.  He understands that he is at increased risk because of his cardiac history as documented by cardiology.  His surgery is scheduled for Tuesday.   ANTICOAGULATION: His Coumadin is on hold and he is on intravenous heparin.  SUBJECTIVE:   No specific complaints this morning.  PHYSICAL EXAM:   Vitals:   10/15/22 1559 10/15/22 1943 10/15/22 2256 10/16/22 0308  BP:  120/75 (!) 138/56 (!) 127/59  Pulse:  (!) 58 (!) 56 (!) 58  Resp:  20 (!) 23 14  Temp: (!) 97.2 F (36.2 C) 97.6 F (36.4 C) 97.6 F (36.4 C) (!) 97.4 F (36.3 C)  TempSrc: Oral Oral Oral Oral  SpO2: 90% 91% 90%   Weight:      Height:       His dressing is dry.  LABS:   Lab Results  Component Value Date   WBC 9.6 10/16/2022   HGB 12.9 (L) 10/16/2022   HCT 41.1 10/16/2022   MCV 84.2 10/16/2022   PLT 372 10/16/2022   Lab Results  Component Value Date   CREATININE 1.41 (H) 10/16/2022   Lab Results  Component Value Date   INR 1.7 (H) 10/14/2022   CBG (last 3)  No results for input(s): "GLUCAP" in the last 72 hours.  PROBLEM LIST:    Principal Problem:   Critical limb ischemia of right lower extremity with ulceration of foot (HCC) Active Problems:   Hypothyroidism   Abdominal aortic aneurysm (HCC)   COPD (chronic obstructive pulmonary disease) (HCC)   Ischemic cardiomyopathy   Peripheral arterial disease (HCC)   HTN (hypertension)   CKD (chronic kidney disease) stage 3, GFR 30-59 ml/min (HCC)   PAF (paroxysmal atrial fibrillation) (HCC)   DVT (deep venous thrombosis) (HCC)   Aortic valve stenosis   CURRENT MEDS:    amiodarone  200 mg Oral BID   atorvastatin  20 mg Oral Daily   furosemide  40 mg Oral BID   levothyroxine  150 mcg Oral Q0600   metoprolol succinate  25 mg Oral BID   sodium chloride flush  3  mL Intravenous Q12H    Waverly Ferrari Office: [phone] 10/16/2022

## 2022-10-16 NOTE — Progress Notes (Signed)
ANTICOAGULATION CONSULT NOTE  Pharmacy Consult for heparin Indication: critical limb ischemia, hx afib/DVT  Allergies  Allergen Reactions   Plavix [Clopidogrel Bisulfate]     Brain hemorrhage prev while on plavix and aspirin    Patient Measurements: Height: 5\' 9"  (175.3 cm) Weight: 68 kg (149 lb 14.6 oz) IBW/kg (Calculated) : 70.7 Heparin Dosing Weight: 68 kg  Vital Signs: Temp: 97.7 F (36.5 C) (06/23 2021) Temp Source: Oral (06/23 2021) BP: 95/45 (06/23 2021) Pulse Rate: 64 (06/23 2021)  Labs: Recent Labs    10/14/22 0229 10/14/22 0617 10/14/22 1110 10/15/22 0757 10/15/22 1734 10/16/22 0303 10/16/22 1114 10/16/22 2018  HGB 12.4*  --   --  13.5  --  12.9*  --   --   HCT 41.0  --   --  43.1  --  41.1  --   --   PLT 347  --   --  375  --  372  --   --   LABPROT  --  20.3*  --   --   --   --   --   --   INR  --  1.7*  --   --   --   --   --   --   HEPARINUNFRC 0.20*  --    < > 0.62   < > <0.10* 0.32 0.36  CREATININE 1.48*  --   --  1.49*  --  1.41*  --   --    < > = values in this interval not displayed.     Estimated Creatinine Clearance: 39.5 mL/min (A) (by C-G formula based on SCr of 1.41 mg/dL (H)).   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 06/2011 Echo: EF 35-40%; b. 06/2012 Echo: EF 50%; c. 12/2016 Echo: EF 55-60%, no rwma, GRI DD, mild AS/MR, nl RV fxn, nl RVSP; d. 02/2021 Echo: EF 55%, GrII DD. Mild MR. Mild to mod TR. Sev AS by VTI.   AAA (abdominal aortic aneurysm) (HCC)    a. 05/2020 Abd u/s: 3.6cm.   Aortic calcification (HCC) 05/01/2013   Bilateral renal artery stenosis (HCC)    a. 06/2013 Angio/PTA: LRA: 95 (5x18 Herculink stent), RRA 60ost; b. 04/2021 Angio: mod RRA stenosis w/ patent LRA stent.   Carotid arterial disease (HCC)    a. 05/2020 Carotid U/S: RICA 1-39%, LICA 40-59%   CHF (congestive heart failure) (HCC)    Chronic kidney disease    COPD (chronic  obstructive pulmonary disease) (HCC)    Coronary artery disease    a. 2013 s/p CABG x 3 (LIMA->LAD, VG->OM, VG->RPDA; b. 06/2012 MV: no ischemia; c. 02/2021 Cath: LM 100, RCA 90ost, 163m, free LIMA->LAD nl, VG->OM2 nl, VG->RPDA nl. Mod AS (mean grad , AVA 1.1cm^2). RHC w/ elev filling pressures.   History of prior cigarette smoking 04/11/2008   Qualifier: Diagnosis of  By: Ermalene Searing MD, Amy     Hyperlipidemia    Hypertension    Hypothyroidism    Intraventricular hemorrhage (HCC) 07/12/2012   Ischemic cardiomyopathy    a. 06/2011 Echo: EF 35-40%; b. 06/2012 Echo: EF 50%; c. 12/2016 Echo: EF 55-60%, no rwma, GRI DD; d. 02/2021 Echo: Ef 55%, GrII DD.   Moderate aortic stenosis    a. 02/2021 Echo: EF 55%, GrII DD. Sev Ca2+ of AoV. Sev AS w/ AVA by VTI 0.79cm^2; b. 02/2021 Cath: Mod AS w/ mean grad . AVA  1.1cm^2.   PAF (paroxysmal atrial fibrillation) (HCC)    Peripheral arterial disease (HCC)    a. Previous left lower extremity stenting by Dr. Evette Cristal;  b. 12/2012 s/p bilat ostial common iliac stenting; c. 04/2021 Angio: Patent LRA stent, mod RRA stenosis. Small AAA. Sev plaque RSFA 2/ subtl occl of inf branch of profunda. Diff Ca2+ SFA dzs w/ collats from profunda and 3V runoff. Diffuse LSFA dzs w/ collats from profunda. Subtl PT dzs. 3V runoff-->Med Rx.   Right leg DVT (HCC) 08/13/2012   Subclavian artery stenosis, left (HCC) 05/2012   Status post stenting of the ostium and self-expanding stent placement to the left axillary artery   Venous insufficiency     Medications:  Medications Prior to Admission  Medication Sig Dispense Refill Last Dose   albuterol (VENTOLIN HFA) 108 (90 Base) MCG/ACT inhaler Inhale 2 puffs into the lungs every 6 (six) hours as needed for wheezing or shortness of breath. 18 g 0 Past Month   amiodarone (PACERONE) 200 MG tablet TAKE 1 TABLET BY MOUTH TWICE A DAY 180 tablet 1 10/11/2022   [EXPIRED] amoxicillin-clavulanate (AUGMENTIN) 875-125 MG tablet Take 1 tablet by  mouth 2 (two) times daily.   10/10/2022   atorvastatin (LIPITOR) 20 MG tablet TAKE 1 TABLET BY MOUTH EVERY DAY 30 tablet 5 10/11/2022   augmented betamethasone dipropionate (DIPROLENE-AF) 0.05 % cream Apply 1 application  topically 2 (two) times daily as needed (breakouts).   Past Week   empagliflozin (JARDIANCE) 10 MG TABS tablet Take 1 tablet (10 mg total) by mouth daily before breakfast. 30 tablet 3 10/11/2022   furosemide (LASIX) 40 MG tablet Take 1 tablet (40 mg total) by mouth 2 (two) times daily. 60 tablet 3 10/11/2022   levothyroxine (SYNTHROID) 150 MCG tablet TAKE ONE TABLET BY MOUTH EVERY DAY BEFORE BREAKFAST (Patient taking differently: Take 150 mcg by mouth daily before breakfast. TAKE ONE TABLET BY MOUTH EVERY DAY BEFORE BREAKFAST) 90 tablet 3 10/11/2022   metoprolol succinate (TOPROL XL) 25 MG 24 hr tablet Take 1 tablet (25 mg total) by mouth 2 (two) times daily. 60 tablet 3 10/11/2022 at 8 am   warfarin (COUMADIN) 5 MG tablet Take 2.5 mg by mouth See admin instructions. Take 2.5 mg by mouth once daily except Saturday.   10/10/2022 at 6 pm   Scheduled:   amiodarone  200 mg Oral BID   atorvastatin  20 mg Oral Daily   furosemide  40 mg Oral BID   levothyroxine  150 mcg Oral Q0600   metoprolol succinate  25 mg Oral BID   potassium chloride  40 mEq Oral Daily   sodium chloride flush  3 mL Intravenous Q12H   Assessment: 82 yom with hx afib/DVT s/p IVC filter on warfarin PTA presenting with critical limb ischemia. Pharmacy consulted to dose heparin when INR <2. Warfarin held. INR 2.5 on presentation. Patient previously on warfarin 2.5 mg daily except no dose on Sat. CBC stable. No bleed issues reported. Continues on PTA amiodarone inpatient.  INR 1.7 - down a bit. SCr down to 1.41.  PM UPDATE: Confirmatory heparin level therapeutic at 0.36 on heparin 1700 units/hr.   Goal of Therapy:  Heparin level 0.3-0.7 units/ml Monitor platelets by anticoagulation protocol: Yes   Plan:  Continue  heparin 1700 units/h  Check heparin level and CBC daily  Monitor for s/sx of bleeding   Rexford Maus, PharmD, BCPS 10/16/2022 9:09 PM

## 2022-10-16 NOTE — Progress Notes (Signed)
PROGRESS NOTE    [name]  QVZ:[id number] DOB: 12/01/1940 DOA: 10/11/2022 PCP: [name], MD   Brief Narrative:  [name] is a 82 y.o. male with medical history significant of PVD status post left subclavian artery and left axillary artery stenting,, CAD status post CABG, CKD stage IIIb, severe AO, PAF and history of DVT on Coumadin and IVC filter, chronic HFrEF presented with worsening of right foot ulcer pain and discoloration.   6/19: Cardiology surgical clearance. INR elevated-Coumadin held, vitamin K given per surgery. 6/20: Zosyn initiated, amiodarone metoprolol and Lasix on hold 6/21-23: Medically stable, pain controlled. Tentative plan for right common femoral artery endarterectomy with below-knee popliteal artery bypass with prosthetic graft planned for 10/18/2022  Assessment & Plan:   Principal Problem:   Critical limb ischemia of right lower extremity with ulceration of foot (HCC) Active Problems:   Abdominal aortic aneurysm (HCC)   PAF (paroxysmal atrial fibrillation) (HCC)   DVT (deep venous thrombosis) (HCC)   Ischemic cardiomyopathy   Peripheral arterial disease (HCC)   Aortic valve stenosis   CKD (chronic kidney disease) stage 3, GFR 30-59 ml/min (HCC)   HTN (hypertension)   COPD (chronic obstructive pulmonary disease) (HCC)   Hypothyroidism   Critical limb ischemia of right lower extremity with ulceration of foot (HCC) -Vascular surgery following, tentative plan for surgery 6/25 with prosthetic graft -Angiography confirmed poor vasculature -Patient high risk for surgery based on HFrEF, severe aortic stenosis and CAD. -Continue Zosyn (started 6/20) -Continue with pain management   Abdominal aortic aneurysm (HCC) 5.5 cm on recent imaging.  Enlarging per prior image comparison. Vascular surgery is on board and does not think that he will be a candidate for endovascular repair, likely will require open surgery which is going to be very high  risk. -Observe for now -repeat imaging and follow-up per vascular   PAF (paroxysmal atrial fibrillation) (HCC) currently sinus rhythm -Failed DCCV in January 2024 with early return in atrial fibrillation.  -Amiodarone, repeat cardioversion February 24 -Metoprolol ongoing, transiently held due to bradycardia -Amiodarone 200mg  daily.  -On chronic anticoagulation with Coumadin -on hold perioperatively  DVT (deep venous thrombosis) (HCC) History of DVT.  Recent vascular ultrasound was negative for DVT in right lower extremity. -Coumadin is under hold for procedure   Ischemic cardiomyopathy Echo January 2024 with EF of 40 to 45%.   Euvolemic on exam. -Continue Lasix, metoprolol -no ACE/ARB/Arni in the setting of renal function -Jardiance on hold perioperatively  Peripheral arterial disease (HCC) -Chronic history -Prior bilateral, iliac stenting -Aortogram shows severe bilateral femoral-popliteal artery disease -with prior stenting, chronic occlusion of left renal artery stent noted.  .  Aortic valve stenosis History of severe aortic stenosis. Not a candidate for repair/replacement given high risk   CKD (chronic kidney disease) stage 3b, GFR 30-59 ml/min (HCC) Creatinine at 1.51 today, baseline appears to be around 1.6-1.8. -Monitor renal function   HTN (hypertension) Blood pressure within goal. -Continue home Lasix and metoprolol   COPD (chronic obstructive pulmonary disease) (HCC) No acute concern. -As needed bronchodilator   Hypothyroidism -Continue Synthroid   DVT prophylaxis: Heparin drip  Code Status:   Code Status: Full Code Family Communication: None present  Status is: Inpt  Dispo: The patient is from: Home              Anticipated d/c is to: To be determined              Anticipated d/c date is: Pending surgical evaluation  Patient currently not medically stable for discharge given ongoing need for further evaluation and treatment and planned  surgery as above  Consultants:  Vascular surgery  Procedures:  Aortogram 6/21 Tentatively planned artery bypass w/ graft  Antimicrobials:  Perioperatively  Subjective: No acute issues or events overnight denies nausea vomiting diarrhea constipation any fevers chills or chest pain  Objective: Vitals:   10/15/22 1559 10/15/22 1943 10/15/22 2256 10/16/22 0308  BP:  120/75 (!) 138/56 (!) 127/59  Pulse:  (!) 58 (!) 56 (!) 58  Resp:  20 (!) 23 14  Temp: (!) 97.2 F (36.2 C) 97.6 F (36.4 C) 97.6 F (36.4 C) (!) 97.4 F (36.3 C)  TempSrc: Oral Oral Oral Oral  SpO2: 90% 91% 90%   Weight:      Height:        Intake/Output Summary (Last 24 hours) at 10/16/2022 0737 Last data filed at 10/15/2022 2300 Gross per 24 hour  Intake 236.83 ml  Output 1100 ml  Net -863.17 ml    Filed Weights   10/11/22 1110  Weight: 68 kg    Examination:  General:  Pleasantly resting in bed, No acute distress. HEENT:  Normocephalic atraumatic.  Sclerae nonicteric, noninjected.  Extraocular movements intact bilaterally. Neck:  Without mass or deformity.  Trachea is midline. Lungs:  Clear to auscultate bilaterally without rhonchi, wheeze, or rales. Heart:  Regular rate and rhythm.  Without murmurs, rubs, or gallops. Abdomen:  Soft, nontender, nondistended.  Without guarding or rebound. Extremities: Right foot multiple toes dark, markedly painful to palpate Skin:  Warm and dry, no erythema -dusky right foot  Data Reviewed: I have personally reviewed following labs and imaging studies  CBC: Recent Labs  Lab 10/11/22 1056 10/11/22 1205 10/12/22 0044 10/13/22 0011 10/14/22 0229 10/15/22 0757 10/16/22 0303  WBC 10.2  --  8.6 11.1* 11.7* 11.5* 9.6  NEUTROABS 7.8*  --   --   --   --   --   --   HGB 12.4*   < > 11.9* 11.6* 12.4* 13.5 12.9*  HCT 40.7   < > 38.6* 38.7* 41.0 43.1 41.1  MCV 86.4  --  87.9 86.8 85.8 86.4 84.2  PLT 336  --  317 351 347 375 372   < > = values in this interval not  displayed.    Basic Metabolic Panel: Recent Labs  Lab 10/12/22 0044 10/13/22 0011 10/14/22 0229 10/15/22 0757 10/16/22 0303  NA 138 134* 138 141 139  K 3.4* 3.4* 3.7 3.5 3.2*  CL 103 101 103 103 104  CO2 26 25 26 28 25   GLUCOSE 91 105* 108* 106* 94  BUN 19 20 18 18 19   CREATININE 1.51* 1.72* 1.48* 1.49* 1.41*  CALCIUM 8.1* 8.0* 8.1* 8.3* 8.1*  MG  --   --   --  2.4  --     GFR: Estimated Creatinine Clearance: 39.5 mL/min (A) (by C-G formula based on SCr of 1.41 mg/dL (H)). Liver Function Tests: No results for input(s): "AST", "ALT", "ALKPHOS", "BILITOT", "PROT", "ALBUMIN" in the last 168 hours. No results for input(s): "LIPASE", "AMYLASE" in the last 168 hours. No results for input(s): "AMMONIA" in the last 168 hours. Coagulation Profile: Recent Labs  Lab 10/11/22 1056 10/12/22 0044 10/13/22 0011 10/14/22 0617  INR 2.5* 2.8* 1.8* 1.7*    Cardiac Enzymes: No results for input(s): "CKTOTAL", "CKMB", "CKMBINDEX", "TROPONINI" in the last 168 hours. BNP (last 3 results) No results for input(s): "PROBNP" in the last 8760  hours. HbA1C: No results for input(s): "HGBA1C" in the last 72 hours. CBG: Recent Labs  Lab 10/11/22 1657 10/11/22 1742 10/12/22 0006 10/12/22 0751  GLUCAP 119* 120* 86 93    Lipid Profile: Recent Labs    10/15/22 0757  CHOL 101  HDL 45  LDLCALC 46  TRIG 51  CHOLHDL 2.2   Thyroid Function Tests: No results for input(s): "TSH", "T4TOTAL", "FREET4", "T3FREE", "THYROIDAB" in the last 72 hours. Anemia Panel: No results for input(s): "VITAMINB12", "FOLATE", "FERRITIN", "TIBC", "IRON", "RETICCTPCT" in the last 72 hours. Sepsis Labs: No results for input(s): "PROCALCITON", "LATICACIDVEN" in the last 168 hours.  No results found for this or any previous visit (from the past 240 hour(s)).       Radiology Studies: PERIPHERAL VASCULAR CATHETERIZATION  Result Date: 10/14/2022 Table formatting from the original result was not included.  Images from the original result were not included.  PATIENT: GORDON VANDUNK                                                                  MRN: [id number] DOB: [birth year]                                                DATE OF PROCEDURE: 10/14/2022  INDICATIONS:   HIROKI WINT is a 82 y.o. male who presents with a nonhealing wound on the right foot.  He had evidence of diffuse infrainguinal arterial occlusive disease.  He presents for arteriography.  PROCEDURE:   Conscious sedation Ultrasound-guided access to the right common femoral artery Aortogram with bilateral iliac arteriogram Retrograde right femoral arteriogram with right lower extremity runoff  SURGEON: Di Kindle. [name], MD, FACS  ANESTHESIA: Local with sedation  EBL: Minimal  TECHNIQUE: The patient was brought to the peripheral vascular lab and was sedated. The period of conscious sedation was 40 minutes.  During that time period, I was present face-to-face 100% of the time.  The patient was administered 1 mg of Versed. The patient's heart rate, blood pressure, and oxygen saturation were monitored by the nurse continuously during the procedure.  Both groins were prepped and draped in the usual sterile fashion.  I looked with the SonoSite at the right common femoral artery.  Of note the patient had iliac stents that extended into the infrarenal aorta so I did not think a contralateral approach could be used.  There was a bulky calcific plaque in the posterior and medial aspect of the common femoral artery.  In order to get above that, I had to stick fairly high on the common femoral artery.  This was done under ultrasound guidance after the skin was anesthetized.  The artery was cannulated with a micropuncture needle and a micropuncture sheath introduced over the wire.  I then injected through the micropuncture sheath to determine that the stick was not too high and I thought that it was just below the inguinal ligament.  The micropuncture sheath was  exchanged for a 5 French sheath over a Bentson wire.  By ultrasound the femoral artery was patent. A real-time image was obtained and sent to the server.  The  pigtail catheter was positioned in the distal infrarenal aorta.  Of note I was trying to conserve contrast given his mild renal insufficiency.  Therefore I did not study the renals.  Although the iliac system was open on the right the arteries were small and the catheter itself was essentially occlusive.  I then removed the pigtail catheter over a wire and a retrograde right femoral arteriogram was obtained to the right femoral sheath.  The wire was then removed.  Next a retrograde right femoral arteriogram was obtained with right lower extremity runoff.  We did this in stages as the patient was moving around quite a bit.  At the completion of the procedure the patient was transferred to the holding area for removal of the sheath.  No immediate complications were noted.  FINDINGS:  The right common iliac artery stent is widely patent.  The right external iliac artery and hypogastric arteries are patent.  The arteries are small with diffuse calcific disease.  The right common femoral artery has a bulky calcific plaque on the posterior and medial aspect of the artery.  The deep femoral artery is patent although small.  The superficial femoral artery is occluded at its origin.  There is reconstitution below that however there is severe diffuse disease throughout the entire right superficial femoral artery and popliteal artery.  There is flow in the below-knee popliteal artery although this artery 2 is markedly calcified and this was also seen on CT scan. Below that there is severe diffuse tibial disease.  The anterior tibial artery is heavily calcified all the way down to the ankle.  The dorsalis pedis is not visualized.  The peroneal artery has severe diffuse disease.  The posterior tibial artery is severely calcified and diseased and occludes above the ankle.    CLINICAL NOTE: This patient has a nonhealing wound of the right foot with severe infrainguinal arterial occlusive disease.  Even with revascularization I think he is at significant risk for limb loss.  He feels strongly about trying to save the limb.  He is not a candidate for an endovascular approach.  I think the only option would be right common femoral artery endarterectomy and a right femoral to below-knee popliteal artery bypass with a prosthetic graft.  I am not sure that I could sew to the femoral artery given the amount of's calcific disease however there is no other targets based on his arteriogram and the amount of calcific disease.  His veins were mapped were not adequate for an autogenous bypass.  I will discuss this with him further over the weekend and discussed the options of primary amputation versus attempted right femoral endarterectomy with right femoral to below-knee popliteal artery bypass with PTFE.  Waverly Ferrari, MD, FACS [facility]     Scheduled Meds:  amiodarone  200 mg Oral BID   atorvastatin  20 mg Oral Daily   furosemide  40 mg Oral BID   levothyroxine  150 mcg Oral Q0600   metoprolol succinate  25 mg Oral BID   sodium chloride flush  3 mL Intravenous Q12H   Continuous Infusions:  sodium chloride     heparin 1,700 Units/hr (10/15/22 2245)   piperacillin-tazobactam (ZOSYN)  IV 3.375 g (10/16/22 0617)     LOS: 5 days   Time spent:  Azucena Fallen, DO [facility]  If 7PM-7AM, please contact night-coverage [email or web address]  10/16/2022, 7:37 AM

## 2022-10-17 ENCOUNTER — Encounter (HOSPITAL_COMMUNITY): Payer: Self-pay | Admitting: Vascular Surgery

## 2022-10-17 DIAGNOSIS — I70235 Atherosclerosis of native arteries of right leg with ulceration of other part of foot: Secondary | ICD-10-CM | POA: Diagnosis not present

## 2022-10-17 LAB — CBC
HCT: 40.2 % (ref 39.0–52.0)
Hemoglobin: 12 g/dL — ABNORMAL LOW (ref 13.0–17.0)
MCH: 25.8 pg — ABNORMAL LOW (ref 26.0–34.0)
MCHC: 29.9 g/dL — ABNORMAL LOW (ref 30.0–36.0)
MCV: 86.5 fL (ref 80.0–100.0)
Platelets: 365 10*3/uL (ref 150–400)
RBC: 4.65 MIL/uL (ref 4.22–5.81)
RDW: 18.2 % — ABNORMAL HIGH (ref 11.5–15.5)
WBC: 10.8 10*3/uL — ABNORMAL HIGH (ref 4.0–10.5)
nRBC: 0 % (ref 0.0–0.2)

## 2022-10-17 LAB — BASIC METABOLIC PANEL
Anion gap: 9 (ref 5–15)
BUN: 23 mg/dL (ref 8–23)
CO2: 24 mmol/L (ref 22–32)
Calcium: 8 mg/dL — ABNORMAL LOW (ref 8.9–10.3)
Chloride: 105 mmol/L (ref 98–111)
Creatinine, Ser: 1.55 mg/dL — ABNORMAL HIGH (ref 0.61–1.24)
GFR, Estimated: 45 mL/min — ABNORMAL LOW (ref >60)
Glucose, Bld: 100 mg/dL — ABNORMAL HIGH (ref 70–99)
Potassium: 3.2 mmol/L — ABNORMAL LOW (ref 3.5–5.1)
Sodium: 138 mmol/L (ref 135–145)

## 2022-10-17 LAB — HEPARIN LEVEL (UNFRACTIONATED): Heparin Unfractionated: 0.39 IU/mL (ref 0.30–0.70)

## 2022-10-17 NOTE — Progress Notes (Addendum)
Progress Note    10/17/2022 7:44 AM 3 Days Post-Op  Subjective:  no major concerns. Eager to have his surgery done   Vitals:   10/17/22 0341 10/17/22 0716  BP:  (!) 150/66  Pulse: (!) 56 (!) 55  Resp: 15 (!) 21  Temp:  (!) 97.5 F (36.4 C)  SpO2: 91% 91%   Physical Exam: Cardiac:  regular Lungs:  non labored Extremities:  Right foot dressings just changed. Clean, dry and intact Neurologic: alert and oriented   CBC    Component Value Date/Time   WBC 10.8 (H) 10/17/2022 0308   RBC 4.65 10/17/2022 0308   HGB 12.0 (L) 10/17/2022 0308   HGB 14.4 05/06/2021 0953   HCT 40.2 10/17/2022 0308   HCT 42.6 05/06/2021 0953   PLT 365 10/17/2022 0308   PLT 199 05/06/2021 0953   MCV 86.5 10/17/2022 0308   MCV 87 05/06/2021 0953   MCV 87 06/03/2013 0454   MCH 25.8 (L) 10/17/2022 0308   MCHC 29.9 (L) 10/17/2022 0308   RDW 18.2 (H) 10/17/2022 0308   RDW 13.6 05/06/2021 0953   RDW 14.2 06/03/2013 0454   LYMPHSABS 1.1 10/11/2022 1056   LYMPHSABS 1.8 05/06/2021 0953   LYMPHSABS 2.7 07/28/2012 0513   MONOABS 0.9 10/11/2022 1056   MONOABS 1.4 (H) 07/28/2012 0513   EOSABS 0.2 10/11/2022 1056   EOSABS 0.3 05/06/2021 0953   EOSABS 0.3 07/28/2012 0513   BASOSABS 0.1 10/11/2022 1056   BASOSABS 0.1 05/06/2021 0953   BASOSABS 1 06/03/2013 0454   BASOSABS 0.1 07/28/2012 0513    BMET    Component Value Date/Time   NA 138 10/17/2022 0308   NA 142 05/06/2021 0953   NA 136 06/03/2013 0454   K 3.2 (L) 10/17/2022 0308   K 4.1 06/03/2013 0454   CL 105 10/17/2022 0308   CL 106 06/03/2013 0454   CO2 24 10/17/2022 0308   CO2 27 06/03/2013 0454   GLUCOSE 100 (H) 10/17/2022 0308   GLUCOSE 80 06/03/2013 0454   BUN 23 10/17/2022 0308   BUN 22 05/06/2021 0953   BUN 22 (H) 06/03/2013 0454   CREATININE 1.55 (H) 10/17/2022 0308   CREATININE 1.59 (H) 06/03/2013 0454   CREATININE 1.02 04/22/2011 1507   CALCIUM 8.0 (L) 10/17/2022 0308   CALCIUM 8.8 06/03/2013 0454   GFRNONAA 45 (L)  10/17/2022 0308   GFRNONAA 43 (L) 06/03/2013 0454   GFRAA 59 (L) 12/30/2016 0103   GFRAA 50 (L) 06/03/2013 0454    INR    Component Value Date/Time   INR 1.7 (H) 10/14/2022 0617   INR 1.3 06/02/2013 0746     Intake/Output Summary (Last 24 hours) at 10/17/2022 0744 Last data filed at 10/17/2022 0343 Gross per 24 hour  Intake --  Output 1100 ml  Net -1100 ml     Assessment/Plan:  82 y.o. male with PAD with gangrene of right foot. Scheduled for right common femoral endarterectomy and right common femoral to below knee popliteal artery bypass with PTFE in the OR tomorrow with Dr. [name] Reviewed procedure with patient and answered his questions  NPO after midnight  Consent order placed   DVT prophylaxis:  IV heparin    [name], PA-C [facility] [phone] 10/17/2022 7:44 AM  I have interviewed the patient and examined the patient. I agree with the findings by the PA.  For surgery tomorrow.  All of his questions have been answered.  He understands that he is at high  risk for limb loss.  Cari Caraway, MD

## 2022-10-17 NOTE — Progress Notes (Signed)
ANTICOAGULATION CONSULT NOTE  Pharmacy Consult for heparin Indication: critical limb ischemia, hx afib/DVT  Allergies  Allergen Reactions   Plavix [Clopidogrel Bisulfate]     Brain hemorrhage prev while on plavix and aspirin    Patient Measurements: Height: 5\' 9"  (175.3 cm) Weight: 68 kg (149 lb 14.6 oz) IBW/kg (Calculated) : 70.7 Heparin Dosing Weight: 68 kg  Vital Signs: Temp: 97.7 F (36.5 C) (06/24 0340) Temp Source: Oral (06/24 0340) BP: 141/69 (06/24 0340) Pulse Rate: 56 (06/24 0341)  Labs: Recent Labs    10/15/22 0757 10/15/22 1734 10/16/22 0303 10/16/22 1114 10/16/22 2018 10/17/22 0308  HGB 13.5  --  12.9*  --   --  12.0*  HCT 43.1  --  41.1  --   --  40.2  PLT 375  --  372  --   --  365  HEPARINUNFRC 0.62   < > <0.10* 0.32 0.36 0.39  CREATININE 1.49*  --  1.41*  --   --  1.55*   < > = values in this interval not displayed.     Estimated Creatinine Clearance: 35.9 mL/min (A) (by C-G formula based on SCr of 1.55 mg/dL (H)).   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 06/2011 Echo: EF 35-40%; b. 06/2012 Echo: EF 50%; c. 12/2016 Echo: EF 55-60%, no rwma, GRI DD, mild AS/MR, nl RV fxn, nl RVSP; d. 02/2021 Echo: EF 55%, GrII DD. Mild MR. Mild to mod TR. Sev AS by VTI.   AAA (abdominal aortic aneurysm) (HCC)    a. 05/2020 Abd u/s: 3.6cm.   Aortic calcification (HCC) 05/01/2013   Bilateral renal artery stenosis (HCC)    a. 06/2013 Angio/PTA: LRA: 95 (5x18 Herculink stent), RRA 60ost; b. 04/2021 Angio: mod RRA stenosis w/ patent LRA stent.   Carotid arterial disease (HCC)    a. 05/2020 Carotid U/S: RICA 1-39%, LICA 40-59%   CHF (congestive heart failure) (HCC)    Chronic kidney disease    COPD (chronic obstructive pulmonary disease) (HCC)    Coronary artery disease    a. 2013 s/p CABG x 3 (LIMA->LAD, VG->OM, VG->RPDA; b. 06/2012 MV: no ischemia; c. 02/2021 Cath: LM 100, RCA 90ost,  182m, free LIMA->LAD nl, VG->OM2 nl, VG->RPDA nl. Mod AS (mean grad , AVA 1.1cm^2). RHC w/ elev filling pressures.   History of prior cigarette smoking 04/11/2008   Qualifier: Diagnosis of  By: Ermalene Searing MD, Amy     Hyperlipidemia    Hypertension    Hypothyroidism    Intraventricular hemorrhage (HCC) 07/12/2012   Ischemic cardiomyopathy    a. 06/2011 Echo: EF 35-40%; b. 06/2012 Echo: EF 50%; c. 12/2016 Echo: EF 55-60%, no rwma, GRI DD; d. 02/2021 Echo: Ef 55%, GrII DD.   Moderate aortic stenosis    a. 02/2021 Echo: EF 55%, GrII DD. Sev Ca2+ of AoV. Sev AS w/ AVA by VTI 0.79cm^2; b. 02/2021 Cath: Mod AS w/ mean grad . AVA 1.1cm^2.   PAF (paroxysmal atrial fibrillation) (HCC)    Peripheral arterial disease (HCC)    a. Previous left lower extremity stenting by Dr. Evette Cristal;  b. 12/2012 s/p bilat ostial common iliac stenting; c. 04/2021 Angio: Patent LRA stent, mod RRA stenosis. Small AAA. Sev plaque RSFA 2/ subtl occl of inf branch of profunda. Diff Ca2+ SFA dzs w/ collats from profunda and 3V runoff. Diffuse LSFA dzs w/ collats from profunda. Subtl PT dzs. 3V runoff-->Med Rx.  Right leg DVT (HCC) 08/13/2012   Subclavian artery stenosis, left (HCC) 05/2012   Status post stenting of the ostium and self-expanding stent placement to the left axillary artery   Venous insufficiency     Medications:  Medications Prior to Admission  Medication Sig Dispense Refill Last Dose   albuterol (VENTOLIN HFA) 108 (90 Base) MCG/ACT inhaler Inhale 2 puffs into the lungs every 6 (six) hours as needed for wheezing or shortness of breath. 18 g 0 Past Month   amiodarone (PACERONE) 200 MG tablet TAKE 1 TABLET BY MOUTH TWICE A DAY 180 tablet 1 10/11/2022   [EXPIRED] amoxicillin-clavulanate (AUGMENTIN) 875-125 MG tablet Take 1 tablet by mouth 2 (two) times daily.   10/10/2022   atorvastatin (LIPITOR) 20 MG tablet TAKE 1 TABLET BY MOUTH EVERY DAY 30 tablet 5 10/11/2022   augmented betamethasone dipropionate  (DIPROLENE-AF) 0.05 % cream Apply 1 application  topically 2 (two) times daily as needed (breakouts).   Past Week   empagliflozin (JARDIANCE) 10 MG TABS tablet Take 1 tablet (10 mg total) by mouth daily before breakfast. 30 tablet 3 10/11/2022   furosemide (LASIX) 40 MG tablet Take 1 tablet (40 mg total) by mouth 2 (two) times daily. 60 tablet 3 10/11/2022   levothyroxine (SYNTHROID) 150 MCG tablet TAKE ONE TABLET BY MOUTH EVERY DAY BEFORE BREAKFAST (Patient taking differently: Take 150 mcg by mouth daily before breakfast. TAKE ONE TABLET BY MOUTH EVERY DAY BEFORE BREAKFAST) 90 tablet 3 10/11/2022   metoprolol succinate (TOPROL XL) 25 MG 24 hr tablet Take 1 tablet (25 mg total) by mouth 2 (two) times daily. 60 tablet 3 10/11/2022 at 8 am   warfarin (COUMADIN) 5 MG tablet Take 2.5 mg by mouth See admin instructions. Take 2.5 mg by mouth once daily except Saturday.   10/10/2022 at 6 pm   Scheduled:   amiodarone  200 mg Oral BID   atorvastatin  20 mg Oral Daily   furosemide  40 mg Oral BID   levothyroxine  150 mcg Oral Q0600   metoprolol succinate  25 mg Oral BID   potassium chloride  40 mEq Oral Daily   sodium chloride flush  3 mL Intravenous Q12H   Assessment: 82 yom with hx afib/DVT s/p IVC filter on warfarin PTA presenting with critical limb ischemia. Warfarin held and heparin started with need for procedures.  Heparin level is therapeutic at 0.39, CBC stable, planning for vascular surgery 6/25.   Goal of Therapy:  Heparin level 0.3-0.7 units/ml Monitor platelets by anticoagulation protocol: Yes   Plan:  Continue heparin 1700 units/h  Daily heparin level and CBC  Fredonia Highland, PharmD, [place], [facility] Clinical Pharmacist [phone] Please check AMION for all [facility] numbers 10/17/2022

## 2022-10-17 NOTE — Progress Notes (Signed)
PROGRESS NOTE    [name]  ZOX:[id number] DOB: 1940-12-09 DOA: 10/11/2022 PCP: [name], MD   Brief Narrative:  [name] is a 82 y.o. male with medical history significant of PVD status post left subclavian artery and left axillary artery stenting,, CAD status post CABG, CKD stage IIIb, severe AO, PAF and history of DVT on Coumadin and IVC filter, chronic HFrEF presented with worsening of right foot ulcer pain and discoloration.   6/19: Cardiology surgical clearance. INR elevated-Coumadin held, vitamin K given per surgery. 6/20: Zosyn initiated, amiodarone metoprolol and Lasix on hold 6/21-24: Medically stable, pain controlled. Tentative plan for right common femoral artery endarterectomy with below-knee popliteal artery bypass with prosthetic graft planned for 10/18/2022  Assessment & Plan:   Principal Problem:   Critical limb ischemia of right lower extremity with ulceration of foot (HCC) Active Problems:   Abdominal aortic aneurysm (HCC)   PAF (paroxysmal atrial fibrillation) (HCC)   DVT (deep venous thrombosis) (HCC)   Ischemic cardiomyopathy   Peripheral arterial disease (HCC)   Aortic valve stenosis   CKD (chronic kidney disease) stage 3, GFR 30-59 ml/min (HCC)   HTN (hypertension)   COPD (chronic obstructive pulmonary disease) (HCC)   Hypothyroidism  Critical limb ischemia of right lower extremity with ulceration of foot (HCC) -Vascular surgery following, tentative plan for surgery 6/25 with prosthetic graft -Angiography confirmed poor vasculature -Patient high risk for surgery based on HFrEF, severe aortic stenosis and CAD. -Continue Zosyn (started 6/20) -Continue with pain management   Abdominal aortic aneurysm (HCC) 5.5 cm on recent imaging.  Enlarging per prior image comparison. Vascular surgery is on board and does not think that he will be a candidate for endovascular repair, likely will require open surgery which is going to be very high  risk. -Observe for now -repeat imaging and follow-up per vascular   PAF (paroxysmal atrial fibrillation) (HCC) currently sinus rhythm -Failed DCCV in January 2024 with early return in atrial fibrillation.  -Amiodarone, repeat cardioversion February 24 -Metoprolol ongoing, transiently held due to bradycardia -Amiodarone 200mg  daily.  -On chronic anticoagulation with Coumadin -on hold perioperatively  DVT (deep venous thrombosis) (HCC) History of DVT.  Recent vascular ultrasound was negative for DVT in right lower extremity. -Coumadin is under hold for procedure   Ischemic cardiomyopathy Echo January 2024 with EF of 40 to 45%.   Euvolemic on exam. -Continue Lasix, metoprolol -no ACE/ARB/Arni in the setting of renal function -Jardiance on hold perioperatively  Peripheral arterial disease (HCC) -Chronic history -Prior bilateral, iliac stenting -Aortogram shows severe bilateral femoral-popliteal artery disease -with prior stenting, chronic occlusion of left renal artery stent noted.  .  Aortic valve stenosis History of severe aortic stenosis. Not a candidate for repair/replacement given high risk   CKD (chronic kidney disease) stage 3b, GFR 30-59 ml/min (HCC) Creatinine at 1.51 today, baseline appears to be around 1.6-1.8. -Monitor renal function   HTN (hypertension) Blood pressure within goal. -Continue home Lasix and metoprolol   COPD (chronic obstructive pulmonary disease) (HCC) No acute concern. -As needed bronchodilator   Hypothyroidism -Continue Synthroid   DVT prophylaxis: Heparin drip  Code Status:   Code Status: Full Code Family Communication: Wife at bedside  Status is: Inpt  Dispo: The patient is from: Home              Anticipated d/c is to: To be determined              Anticipated d/c date is: Pending surgical evaluation  Patient currently not medically stable for discharge given ongoing need for further evaluation and treatment and planned  surgery as above  Consultants:  Vascular surgery  Procedures:  Aortogram 6/21 Tentatively planned artery bypass w/ graft  Antimicrobials:  Perioperatively  Subjective: No acute issues or events overnight denies nausea vomiting diarrhea constipation any fevers chills or chest pain  Objective: Vitals:   10/17/22 0019 10/17/22 0340 10/17/22 0341 10/17/22 0716  BP: 135/63 (!) 141/69  (!) 150/66  Pulse: (!) 56 (!) 56 (!) 56 (!) 55  Resp: 20 (!) 27 15 (!) 21  Temp:  97.7 F (36.5 C)  (!) 97.5 F (36.4 C)  TempSrc:  Oral  Oral  SpO2: 92% 93% 91% 91%  Weight:      Height:        Intake/Output Summary (Last 24 hours) at 10/17/2022 0759 Last data filed at 10/17/2022 0343 Gross per 24 hour  Intake --  Output 1100 ml  Net -1100 ml    Filed Weights   10/11/22 1110  Weight: 68 kg    Examination:  General:  Pleasantly resting in bed, No acute distress. HEENT:  Normocephalic atraumatic.  Sclerae nonicteric, noninjected.  Extraocular movements intact bilaterally. Neck:  Without mass or deformity.  Trachea is midline. Lungs:  Clear to auscultate bilaterally without rhonchi, wheeze, or rales. Heart:  Regular rate and rhythm.  Without murmurs, rubs, or gallops. Abdomen:  Soft, nontender, nondistended.  Without guarding or rebound. Extremities: Right foot multiple toes dark, markedly painful to palpate Skin:  Warm and dry, no erythema -dusky right foot  Data Reviewed: I have personally reviewed following labs and imaging studies  CBC: Recent Labs  Lab 10/11/22 1056 10/11/22 1205 10/13/22 0011 10/14/22 0229 10/15/22 0757 10/16/22 0303 10/17/22 0308  WBC 10.2   < > 11.1* 11.7* 11.5* 9.6 10.8*  NEUTROABS 7.8*  --   --   --   --   --   --   HGB 12.4*   < > 11.6* 12.4* 13.5 12.9* 12.0*  HCT 40.7   < > 38.7* 41.0 43.1 41.1 40.2  MCV 86.4   < > 86.8 85.8 86.4 84.2 86.5  PLT 336   < > 351 347 375 372 365   < > = values in this interval not displayed.    Basic Metabolic  Panel: Recent Labs  Lab 10/13/22 0011 10/14/22 0229 10/15/22 0757 10/16/22 0303 10/17/22 0308  NA 134* 138 141 139 138  K 3.4* 3.7 3.5 3.2* 3.2*  CL 101 103 103 104 105  CO2 25 26 28 25 24   GLUCOSE 105* 108* 106* 94 100*  BUN 20 18 18 19 23   CREATININE 1.72* 1.48* 1.49* 1.41* 1.55*  CALCIUM 8.0* 8.1* 8.3* 8.1* 8.0*  MG  --   --  2.4  --   --     GFR: Estimated Creatinine Clearance: 35.9 mL/min (A) (by C-G formula based on SCr of 1.55 mg/dL (H)). Liver Function Tests: No results for input(s): "AST", "ALT", "ALKPHOS", "BILITOT", "PROT", "ALBUMIN" in the last 168 hours. No results for input(s): "LIPASE", "AMYLASE" in the last 168 hours. No results for input(s): "AMMONIA" in the last 168 hours. Coagulation Profile: Recent Labs  Lab 10/11/22 1056 10/12/22 0044 10/13/22 0011 10/14/22 0617  INR 2.5* 2.8* 1.8* 1.7*    Cardiac Enzymes: No results for input(s): "CKTOTAL", "CKMB", "CKMBINDEX", "TROPONINI" in the last 168 hours. BNP (last 3 results) No results for input(s): "PROBNP" in the last 8760 hours. HbA1C: No  results for input(s): "HGBA1C" in the last 72 hours. CBG: Recent Labs  Lab 10/11/22 1657 10/11/22 1742 10/12/22 0006 10/12/22 0751  GLUCAP 119* 120* 86 93    Lipid Profile: Recent Labs    10/15/22 0757  CHOL 101  HDL 45  LDLCALC 46  TRIG 51  CHOLHDL 2.2    Thyroid Function Tests: No results for input(s): "TSH", "T4TOTAL", "FREET4", "T3FREE", "THYROIDAB" in the last 72 hours. Anemia Panel: No results for input(s): "VITAMINB12", "FOLATE", "FERRITIN", "TIBC", "IRON", "RETICCTPCT" in the last 72 hours. Sepsis Labs: No results for input(s): "PROCALCITON", "LATICACIDVEN" in the last 168 hours.  No results found for this or any previous visit (from the past 240 hour(s)).       Radiology Studies: No results found.  Scheduled Meds:  amiodarone  200 mg Oral BID   atorvastatin  20 mg Oral Daily   furosemide  40 mg Oral BID   levothyroxine  150  mcg Oral Q0600   metoprolol succinate  25 mg Oral BID   potassium chloride  40 mEq Oral Daily   sodium chloride flush  3 mL Intravenous Q12H   Continuous Infusions:  sodium chloride     heparin 1,700 Units/hr (10/17/22 0358)   piperacillin-tazobactam (ZOSYN)  IV 3.375 g (10/17/22 0522)     LOS: 6 days   Time spent:  Azucena Fallen, DO [facility]  If 7PM-7AM, please contact night-coverage [email or web address]  10/17/2022, 7:59 AM

## 2022-10-18 ENCOUNTER — Encounter (HOSPITAL_COMMUNITY)
Admission: EM | Disposition: A | Discharge status: Skilled Nursing Facility | Payer: Self-pay | Source: Ambulatory Visit | Attending: Internal Medicine

## 2022-10-18 ENCOUNTER — Encounter (HOSPITAL_COMMUNITY): Payer: Self-pay | Admitting: Internal Medicine

## 2022-10-18 ENCOUNTER — Inpatient Hospital Stay (HOSPITAL_COMMUNITY): Payer: Medicare Other | Admitting: Anesthesiology

## 2022-10-18 ENCOUNTER — Other Ambulatory Visit: Payer: Self-pay

## 2022-10-18 DIAGNOSIS — I2510 Atherosclerotic heart disease of native coronary artery without angina pectoris: Secondary | ICD-10-CM

## 2022-10-18 DIAGNOSIS — Z87891 Personal history of nicotine dependence: Secondary | ICD-10-CM

## 2022-10-18 DIAGNOSIS — L97519 Non-pressure chronic ulcer of other part of right foot with unspecified severity: Secondary | ICD-10-CM

## 2022-10-18 DIAGNOSIS — I70235 Atherosclerosis of native arteries of right leg with ulceration of other part of foot: Secondary | ICD-10-CM | POA: Diagnosis not present

## 2022-10-18 DIAGNOSIS — I739 Peripheral vascular disease, unspecified: Secondary | ICD-10-CM

## 2022-10-18 DIAGNOSIS — I509 Heart failure, unspecified: Secondary | ICD-10-CM

## 2022-10-18 DIAGNOSIS — I119 Hypertensive heart disease without heart failure: Secondary | ICD-10-CM

## 2022-10-18 HISTORY — PX: PATCH ANGIOPLASTY: SHX6230

## 2022-10-18 HISTORY — PX: ENDARTERECTOMY FEMORAL: SHX5804

## 2022-10-18 HISTORY — PX: APPLICATION OF WOUND VAC: SHX5189

## 2022-10-18 HISTORY — PX: VEIN HARVEST: SHX6363

## 2022-10-18 HISTORY — PX: FEMORAL-POPLITEAL BYPASS GRAFT: SHX937

## 2022-10-18 LAB — BLOOD GAS, ARTERIAL
Acid-base deficit: 0.3 mmol/L (ref 0.0–2.0)
Bicarbonate: 28 mmol/L (ref 20.0–28.0)
O2 Saturation: 97.7 %
Patient temperature: 37
pCO2 arterial: 61 mmHg — ABNORMAL HIGH (ref 32–48)
pH, Arterial: 7.27 — ABNORMAL LOW (ref 7.35–7.45)
pO2, Arterial: 92 mmHg (ref 83–108)

## 2022-10-18 LAB — BASIC METABOLIC PANEL
Anion gap: 9 (ref 5–15)
BUN: 23 mg/dL (ref 8–23)
CO2: 25 mmol/L (ref 22–32)
Calcium: 8.3 mg/dL — ABNORMAL LOW (ref 8.9–10.3)
Chloride: 106 mmol/L (ref 98–111)
Creatinine, Ser: 1.66 mg/dL — ABNORMAL HIGH (ref 0.61–1.24)
GFR, Estimated: 41 mL/min — ABNORMAL LOW (ref >60)
Glucose, Bld: 103 mg/dL — ABNORMAL HIGH (ref 70–99)
Potassium: 3.6 mmol/L (ref 3.5–5.1)
Sodium: 140 mmol/L (ref 135–145)

## 2022-10-18 LAB — TYPE AND SCREEN
ABO/RH(D): A NEG
Antibody Screen: NEGATIVE

## 2022-10-18 LAB — CBC
HCT: 39.4 % (ref 39.0–52.0)
Hemoglobin: 12.2 g/dL — ABNORMAL LOW (ref 13.0–17.0)
MCH: 26.4 pg (ref 26.0–34.0)
MCHC: 31 g/dL (ref 30.0–36.0)
MCV: 85.3 fL (ref 80.0–100.0)
Platelets: 359 10*3/uL (ref 150–400)
RBC: 4.62 MIL/uL (ref 4.22–5.81)
RDW: 18.3 % — ABNORMAL HIGH (ref 11.5–15.5)
WBC: 10.8 10*3/uL — ABNORMAL HIGH (ref 4.0–10.5)
nRBC: 0 % (ref 0.0–0.2)

## 2022-10-18 LAB — SURGICAL PCR SCREEN
MRSA, PCR: NEGATIVE
Staphylococcus aureus: NEGATIVE

## 2022-10-18 LAB — HEPARIN LEVEL (UNFRACTIONATED): Heparin Unfractionated: 0.52 IU/mL (ref 0.30–0.70)

## 2022-10-18 SURGERY — ENDARTERECTOMY, FEMORAL
Anesthesia: General | Site: Leg Upper | Laterality: Right

## 2022-10-18 MED ORDER — EPHEDRINE 5 MG/ML INJ
INTRAVENOUS | Status: AC
  Filled 2022-10-18: qty 10

## 2022-10-18 MED ORDER — AMISULPRIDE (ANTIEMETIC) 5 MG/2ML IV SOLN: 10.0000 mg | Freq: Once | INTRAVENOUS | Status: DC | PRN

## 2022-10-18 MED ORDER — FENTANYL CITRATE (PF) 250 MCG/5ML IJ SOLN
INTRAMUSCULAR | Status: AC
  Filled 2022-10-18: qty 5

## 2022-10-18 MED ORDER — ASPIRIN 81 MG PO TBEC
81.0000 mg | DELAYED_RELEASE_TABLET | Freq: Every day | ORAL | Status: DC
  Administered 2022-10-19 – 2022-10-31 (×12): 81 mg via ORAL
  Filled 2022-10-18 (×12): qty 1

## 2022-10-18 MED ORDER — DEXAMETHASONE SODIUM PHOSPHATE 10 MG/ML IJ SOLN
INTRAMUSCULAR | Status: AC
  Filled 2022-10-18: qty 1

## 2022-10-18 MED ORDER — PANTOPRAZOLE SODIUM 40 MG PO TBEC
40.0000 mg | DELAYED_RELEASE_TABLET | Freq: Every day | ORAL | Status: DC
  Administered 2022-10-19 – 2022-10-24 (×6): 40 mg via ORAL
  Filled 2022-10-18 (×6): qty 1

## 2022-10-18 MED ORDER — ETOMIDATE 2 MG/ML IV SOLN
INTRAVENOUS | Status: AC
  Filled 2022-10-18: qty 10

## 2022-10-18 MED ORDER — ORAL CARE MOUTH RINSE: 15.0000 mL | Freq: Once | OROMUCOSAL | Status: AC

## 2022-10-18 MED ORDER — 0.9 % SODIUM CHLORIDE (POUR BTL) OPTIME
TOPICAL | Status: DC | PRN
  Administered 2022-10-18: 1000 mL

## 2022-10-18 MED ORDER — HEPARIN 6000 UNIT IRRIGATION SOLUTION
Status: AC
  Filled 2022-10-18: qty 500

## 2022-10-18 MED ORDER — ACETAMINOPHEN 500 MG PO TABS
1000.0000 mg | ORAL_TABLET | Freq: Once | ORAL | Status: AC
  Administered 2022-10-18: 1000 mg via ORAL
  Filled 2022-10-18: qty 2

## 2022-10-18 MED ORDER — ROCURONIUM BROMIDE 10 MG/ML (PF) SYRINGE
PREFILLED_SYRINGE | INTRAVENOUS | Status: AC
  Filled 2022-10-18: qty 10

## 2022-10-18 MED ORDER — LACTATED RINGERS IV SOLN: INTRAVENOUS | Status: DC

## 2022-10-18 MED ORDER — ONDANSETRON HCL 4 MG/2ML IJ SOLN: 4.0000 mg | Freq: Four times a day (QID) | INTRAMUSCULAR | Status: DC | PRN

## 2022-10-18 MED ORDER — DEXAMETHASONE SODIUM PHOSPHATE 10 MG/ML IJ SOLN
INTRAMUSCULAR | Status: DC | PRN
  Administered 2022-10-18: 10 mg via INTRAVENOUS

## 2022-10-18 MED ORDER — ALBUMIN HUMAN 5 % IV SOLN
12.5000 g | Freq: Once | INTRAVENOUS | Status: AC
  Administered 2022-10-18: 12.5 g via INTRAVENOUS

## 2022-10-18 MED ORDER — ACETAMINOPHEN 10 MG/ML IV SOLN: 1000.0000 mg | Freq: Once | INTRAVENOUS | Status: DC | PRN

## 2022-10-18 MED ORDER — ALUM & MAG HYDROXIDE-SIMETH 200-200-20 MG/5ML PO SUSP: 15.0000 mL | ORAL | Status: DC | PRN

## 2022-10-18 MED ORDER — FENTANYL CITRATE (PF) 100 MCG/2ML IJ SOLN: 25.0000 ug | INTRAMUSCULAR | Status: DC | PRN

## 2022-10-18 MED ORDER — POTASSIUM CHLORIDE CRYS ER 20 MEQ PO TBCR: 20.0000 meq | EXTENDED_RELEASE_TABLET | Freq: Every day | ORAL | Status: DC | PRN

## 2022-10-18 MED ORDER — PHENOL 1.4 % MT LIQD: 1.0000 | OROMUCOSAL | Status: DC | PRN

## 2022-10-18 MED ORDER — OXYCODONE HCL 5 MG/5ML PO SOLN: 5.0000 mg | Freq: Once | ORAL | Status: DC | PRN

## 2022-10-18 MED ORDER — SODIUM CHLORIDE 0.9 % IV SOLN: INTRAVENOUS | Status: AC

## 2022-10-18 MED ORDER — SODIUM CHLORIDE 0.9 % IV SOLN: 250.0000 mL | INTRAVENOUS | Status: DC

## 2022-10-18 MED ORDER — OXYCODONE HCL 5 MG PO TABS: 5.0000 mg | ORAL_TABLET | Freq: Once | ORAL | Status: DC | PRN

## 2022-10-18 MED ORDER — HYDRALAZINE HCL 20 MG/ML IJ SOLN: 5.0000 mg | INTRAMUSCULAR | Status: DC | PRN

## 2022-10-18 MED ORDER — ACETAMINOPHEN 325 MG RE SUPP: 325.0000 mg | RECTAL | Status: DC | PRN

## 2022-10-18 MED ORDER — EPHEDRINE SULFATE-NACL 50-0.9 MG/10ML-% IV SOSY
PREFILLED_SYRINGE | INTRAVENOUS | Status: DC | PRN
  Administered 2022-10-18: 5 mg via INTRAVENOUS

## 2022-10-18 MED ORDER — LABETALOL HCL 5 MG/ML IV SOLN: 10.0000 mg | INTRAVENOUS | Status: DC | PRN

## 2022-10-18 MED ORDER — METOPROLOL TARTRATE 5 MG/5ML IV SOLN: 2.0000 mg | INTRAVENOUS | Status: DC | PRN

## 2022-10-18 MED ORDER — LIDOCAINE 2% (20 MG/ML) 5 ML SYRINGE
INTRAMUSCULAR | Status: DC | PRN
  Administered 2022-10-18: 100 mg via INTRAVENOUS

## 2022-10-18 MED ORDER — HEMOSTATIC AGENTS (NO CHARGE) OPTIME
TOPICAL | Status: DC | PRN
  Administered 2022-10-18: 1 via TOPICAL

## 2022-10-18 MED ORDER — PROTAMINE SULFATE 10 MG/ML IV SOLN
INTRAVENOUS | Status: AC
  Filled 2022-10-18: qty 15

## 2022-10-18 MED ORDER — SODIUM CHLORIDE 0.9 % IV SOLN: 500.0000 mL | Freq: Once | INTRAVENOUS | Status: DC | PRN

## 2022-10-18 MED ORDER — GUAIFENESIN-DM 100-10 MG/5ML PO SYRP: 15.0000 mL | ORAL_SOLUTION | ORAL | Status: DC | PRN

## 2022-10-18 MED ORDER — HEPARIN SODIUM (PORCINE) 1000 UNIT/ML IJ SOLN
INTRAMUSCULAR | Status: AC
  Filled 2022-10-18: qty 30

## 2022-10-18 MED ORDER — ALBUMIN HUMAN 5 % IV SOLN
INTRAVENOUS | Status: AC
  Filled 2022-10-18: qty 250

## 2022-10-18 MED ORDER — ACETAMINOPHEN 325 MG PO TABS
325.0000 mg | ORAL_TABLET | ORAL | Status: DC | PRN
  Administered 2022-10-23 – 2022-10-27 (×4): 650 mg via ORAL
  Filled 2022-10-18 (×5): qty 2

## 2022-10-18 MED ORDER — PROTAMINE SULFATE 10 MG/ML IV SOLN
INTRAVENOUS | Status: DC | PRN
  Administered 2022-10-18 (×5): 10 mg via INTRAVENOUS

## 2022-10-18 MED ORDER — FENTANYL CITRATE (PF) 250 MCG/5ML IJ SOLN
INTRAMUSCULAR | Status: DC | PRN
  Administered 2022-10-18: 100 ug via INTRAVENOUS
  Administered 2022-10-18: 25 ug via INTRAVENOUS
  Administered 2022-10-18: 50 ug via INTRAVENOUS

## 2022-10-18 MED ORDER — MAGNESIUM SULFATE 2 GM/50ML IV SOLN: 2.0000 g | Freq: Every day | INTRAVENOUS | Status: DC | PRN

## 2022-10-18 MED ORDER — PHENYLEPHRINE HCL-NACL 20-0.9 MG/250ML-% IV SOLN
0.0000 ug/min | INTRAVENOUS | Status: DC
  Administered 2022-10-19 – 2022-10-20 (×2): 10 ug/min via INTRAVENOUS
  Administered 2022-10-20: 20 ug/min via INTRAVENOUS
  Filled 2022-10-18 (×3): qty 250

## 2022-10-18 MED ORDER — CHLORHEXIDINE GLUCONATE 0.12 % MT SOLN
15.0000 mL | Freq: Once | OROMUCOSAL | Status: AC
  Administered 2022-10-18: 15 mL via OROMUCOSAL
  Filled 2022-10-18: qty 15

## 2022-10-18 MED ORDER — ALBUMIN HUMAN 5 % IV SOLN: INTRAVENOUS | Status: DC | PRN

## 2022-10-18 MED ORDER — PHENYLEPHRINE 80 MCG/ML (10ML) SYRINGE FOR IV PUSH (FOR BLOOD PRESSURE SUPPORT)
PREFILLED_SYRINGE | INTRAVENOUS | Status: DC | PRN
  Administered 2022-10-18: 160 ug via INTRAVENOUS

## 2022-10-18 MED ORDER — ETOMIDATE 2 MG/ML IV SOLN
INTRAVENOUS | Status: DC | PRN
  Administered 2022-10-18: 12 mg via INTRAVENOUS

## 2022-10-18 MED ORDER — HEPARIN (PORCINE) 25000 UT/250ML-% IV SOLN
1550.0000 [IU]/h | INTRAVENOUS | Status: DC
  Administered 2022-10-19 – 2022-10-20 (×4): 1700 [IU]/h via INTRAVENOUS
  Administered 2022-10-21 – 2022-10-25 (×6): 1550 [IU]/h via INTRAVENOUS
  Filled 2022-10-18 (×10): qty 250

## 2022-10-18 MED ORDER — HEPARIN 6000 UNIT IRRIGATION SOLUTION
Status: DC | PRN
  Administered 2022-10-18: 1

## 2022-10-18 MED ORDER — SODIUM CHLORIDE 0.9 % IV SOLN
25.0000 ug/min | INTRAVENOUS | Status: DC
  Administered 2022-10-18: 25 ug/min via INTRAVENOUS

## 2022-10-18 MED ORDER — PHENYLEPHRINE HCL-NACL 20-0.9 MG/250ML-% IV SOLN
INTRAVENOUS | Status: DC | PRN
  Administered 2022-10-18: 50 ug/min via INTRAVENOUS

## 2022-10-18 MED ORDER — LACTATED RINGERS IV SOLN: INTRAVENOUS | Status: DC | PRN

## 2022-10-18 MED ORDER — ROCURONIUM BROMIDE 10 MG/ML (PF) SYRINGE
PREFILLED_SYRINGE | INTRAVENOUS | Status: DC | PRN
  Administered 2022-10-18: 80 mg via INTRAVENOUS

## 2022-10-18 MED ORDER — HEPARIN SODIUM (PORCINE) 1000 UNIT/ML IJ SOLN
INTRAMUSCULAR | Status: DC | PRN
  Administered 2022-10-18: 2000 [IU] via INTRAVENOUS
  Administered 2022-10-18: 8000 [IU] via INTRAVENOUS

## 2022-10-18 SURGICAL SUPPLY — 76 items
ADH SKN CLS APL DERMABOND .7 (GAUZE/BANDAGES/DRESSINGS) ×6
AGENT HMST 10 BLLW SHRT CANN (HEMOSTASIS) ×3
BAG COUNTER SPONGE SURGICOUNT (BAG) ×3 IMPLANT
BAG SPNG CNTER NS LX DISP (BAG) ×3
BANDAGE ESMARK 6X9 LF (GAUZE/BANDAGES/DRESSINGS) IMPLANT
BNDG CMPR 5X4 CHSV STRCH STRL (GAUZE/BANDAGES/DRESSINGS) ×3
BNDG CMPR 9X6 STRL LF SNTH (GAUZE/BANDAGES/DRESSINGS) ×3
BNDG COHESIVE 4X5 TAN STRL LF (GAUZE/BANDAGES/DRESSINGS) IMPLANT
BNDG ESMARK 6X9 LF (GAUZE/BANDAGES/DRESSINGS) ×3
CANISTER PREVENA PLUS 150 (CANNISTER) IMPLANT
CANISTER SUCT 3000ML PPV (MISCELLANEOUS) ×3 IMPLANT
CANNULA VESSEL 3MM 2 BLNT TIP (CANNULA) ×6 IMPLANT
CATH EMB 4FR 40 (CATHETERS) IMPLANT
CLIP TI MEDIUM 24 (CLIP) ×3 IMPLANT
CLIP TI WIDE RED SMALL 24 (CLIP) ×3 IMPLANT
COVER PROBE W GEL 5X96 (DRAPES) IMPLANT
CUFF TOURN SGL QUICK 24 (TOURNIQUET CUFF) ×3
CUFF TOURN SGL QUICK 34 (TOURNIQUET CUFF)
CUFF TOURN SGL QUICK 42 (TOURNIQUET CUFF) IMPLANT
CUFF TRNQT CYL 24X4X16.5-23 (TOURNIQUET CUFF) IMPLANT
CUFF TRNQT CYL 34X4.125X (TOURNIQUET CUFF) IMPLANT
DERMABOND ADVANCED .7 DNX12 (GAUZE/BANDAGES/DRESSINGS) ×3 IMPLANT
DRAIN CHANNEL 15F RND FF W/TCR (WOUND CARE) IMPLANT
DRAPE HALF SHEET 40X57 (DRAPES) IMPLANT
DRAPE X-RAY CASS 24X20 (DRAPES) IMPLANT
DRESSING PEEL AND PLC PRVNA 13 (GAUZE/BANDAGES/DRESSINGS) IMPLANT
DRSG PEEL AND PLACE PREVENA 13 (GAUZE/BANDAGES/DRESSINGS) ×3
ELECT REM PT RETURN 9FT ADLT (ELECTROSURGICAL) ×3
ELECTRODE REM PT RTRN 9FT ADLT (ELECTROSURGICAL) ×3 IMPLANT
EVACUATOR SILICONE 100CC (DRAIN) IMPLANT
GAUZE 4X4 16PLY ~~LOC~~+RFID DBL (SPONGE) IMPLANT
GAUZE SPONGE 4X4 12PLY STRL (GAUZE/BANDAGES/DRESSINGS) IMPLANT
GLOVE BIO SURGEON STRL SZ7.5 (GLOVE) ×3 IMPLANT
GLOVE BIOGEL PI IND STRL 8 (GLOVE) ×3 IMPLANT
GLOVE SURG POLY ORTHO LF SZ7.5 (GLOVE) IMPLANT
GLOVE SURG UNDER LTX SZ8 (GLOVE) ×3 IMPLANT
GOWN STRL REUS W/ TWL LRG LVL3 (GOWN DISPOSABLE) ×9 IMPLANT
GOWN STRL REUS W/TWL LRG LVL3 (GOWN DISPOSABLE) ×9
GRAFT PROPATEN W/RING 6X80X60 (Vascular Products) IMPLANT
HEMOSTAT HEMOBLAST BELLOWS (HEMOSTASIS) IMPLANT
INSERT FOGARTY SM (MISCELLANEOUS) IMPLANT
KIT BASIN OR (CUSTOM PROCEDURE TRAY) ×3 IMPLANT
KIT TURNOVER KIT B (KITS) ×3 IMPLANT
LOOP VASCULAR MINI 18 RED (MISCELLANEOUS) ×3
MARKER GRAFT CORONARY BYPASS (MISCELLANEOUS) IMPLANT
NS IRRIG 1000ML POUR BTL (IV SOLUTION) ×6 IMPLANT
PACK PERIPHERAL VASCULAR (CUSTOM PROCEDURE TRAY) ×3 IMPLANT
PAD ARMBOARD 7.5X6 YLW CONV (MISCELLANEOUS) ×6 IMPLANT
SET COLLECT BLD 21X3/4 12 (NEEDLE) IMPLANT
SET WALTER ACTIVATION W/DRAPE (SET/KITS/TRAYS/PACK) ×3 IMPLANT
SPONGE INTESTINAL PEANUT (DISPOSABLE) IMPLANT
SPONGE SURGIFOAM ABS GEL 100 (HEMOSTASIS) IMPLANT
SPONGE T-LAP 18X18 ~~LOC~~+RFID (SPONGE) IMPLANT
STAPLER VISISTAT (STAPLE) IMPLANT
STOPCOCK 4 WAY LG BORE MALE ST (IV SETS) IMPLANT
SUT ETHIBOND 5 LR DA (SUTURE) IMPLANT
SUT ETHILON 3 0 PS 1 (SUTURE) IMPLANT
SUT MNCRL AB 4-0 PS2 18 (SUTURE) ×6 IMPLANT
SUT PROLENE 5 0 C 1 24 (SUTURE) ×3 IMPLANT
SUT PROLENE 6 0 BV (SUTURE) ×3 IMPLANT
SUT SILK 2 0 SH (SUTURE) ×3 IMPLANT
SUT SILK 3 0 (SUTURE)
SUT SILK 3-0 18XBRD TIE 12 (SUTURE) IMPLANT
SUT VIC AB 2-0 CTB1 (SUTURE) ×6 IMPLANT
SUT VIC AB 3-0 SH 27 (SUTURE) ×6
SUT VIC AB 3-0 SH 27X BRD (SUTURE) ×6 IMPLANT
SYR 30ML LL (SYRINGE) IMPLANT
SYR 3ML LL SCALE MARK (SYRINGE) IMPLANT
SYR TB 1ML LUER SLIP (SYRINGE) IMPLANT
TAPE UMBILICAL 1/8X30 (MISCELLANEOUS) IMPLANT
TOWEL GREEN STERILE (TOWEL DISPOSABLE) ×3 IMPLANT
TRAY FOLEY MTR SLVR 16FR STAT (SET/KITS/TRAYS/PACK) ×3 IMPLANT
TUBING EXTENTION W/L.L. (IV SETS) IMPLANT
UNDERPAD 30X36 HEAVY ABSORB (UNDERPADS AND DIAPERS) ×3 IMPLANT
VASCULAR TIE MINI RED 18IN STL (MISCELLANEOUS) IMPLANT
WATER STERILE IRR 1000ML POUR (IV SOLUTION) ×3 IMPLANT

## 2022-10-18 NOTE — Progress Notes (Signed)
  Progress Note    10/18/2022 7:40 AM 4 Days Post-Op  Subjective:  no complaints   Vitals:   10/18/22 0158 10/18/22 0448  BP: (!) 144/62 (!) 152/85  Pulse: 60 (!) 59  Resp:  14  Temp: (!) 97.4 F (36.3 C)   SpO2: 92% 98%   Physical Exam: Lungs:  non labored Extremities:  dressing R foot Neurologic: A&O  CBC    Component Value Date/Time   WBC 10.8 (H) 10/18/2022 0152   RBC 4.62 10/18/2022 0152   HGB 12.2 (L) 10/18/2022 0152   HGB 14.4 05/06/2021 0953   HCT 39.4 10/18/2022 0152   HCT 42.6 05/06/2021 0953   PLT 359 10/18/2022 0152   PLT 199 05/06/2021 0953   MCV 85.3 10/18/2022 0152   MCV 87 05/06/2021 0953   MCV 87 06/03/2013 0454   MCH 26.4 10/18/2022 0152   MCHC 31.0 10/18/2022 0152   RDW 18.3 (H) 10/18/2022 0152   RDW 13.6 05/06/2021 0953   RDW 14.2 06/03/2013 0454   LYMPHSABS 1.1 10/11/2022 1056   LYMPHSABS 1.8 05/06/2021 0953   LYMPHSABS 2.7 07/28/2012 0513   MONOABS 0.9 10/11/2022 1056   MONOABS 1.4 (H) 07/28/2012 0513   EOSABS 0.2 10/11/2022 1056   EOSABS 0.3 05/06/2021 0953   EOSABS 0.3 07/28/2012 0513   BASOSABS 0.1 10/11/2022 1056   BASOSABS 0.1 05/06/2021 0953   BASOSABS 1 06/03/2013 0454   BASOSABS 0.1 07/28/2012 0513    BMET    Component Value Date/Time   NA 140 10/18/2022 0152   NA 142 05/06/2021 0953   NA 136 06/03/2013 0454   K 3.6 10/18/2022 0152   K 4.1 06/03/2013 0454   CL 106 10/18/2022 0152   CL 106 06/03/2013 0454   CO2 25 10/18/2022 0152   CO2 27 06/03/2013 0454   GLUCOSE 103 (H) 10/18/2022 0152   GLUCOSE 80 06/03/2013 0454   BUN 23 10/18/2022 0152   BUN 22 05/06/2021 0953   BUN 22 (H) 06/03/2013 0454   CREATININE 1.66 (H) 10/18/2022 0152   CREATININE 1.59 (H) 06/03/2013 0454   CREATININE 1.02 04/22/2011 1507   CALCIUM 8.3 (L) 10/18/2022 0152   CALCIUM 8.8 06/03/2013 0454   GFRNONAA 41 (L) 10/18/2022 0152   GFRNONAA 43 (L) 06/03/2013 0454   GFRAA 59 (L) 12/30/2016 0103   GFRAA 50 (L) 06/03/2013 0454    INR     Component Value Date/Time   INR 1.7 (H) 10/14/2022 0617   INR 1.3 06/02/2013 0746     Intake/Output Summary (Last 24 hours) at 10/18/2022 0740 Last data filed at 10/18/2022 0737 Gross per 24 hour  Intake --  Output 1950 ml  Net -1950 ml     Assessment/Plan:  82 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 Dickson.  Case was reviewed with the patient and all questions were answered.  He is agreeable to proceed. Continue npo. Consent ordered   Louvenia Golomb, PA-C [facility] [phone] 10/18/2022 7:40 AM    

## 2022-10-18 NOTE — Anesthesia Preprocedure Evaluation (Addendum)
Anesthesia Evaluation  Patient identified by MRN, date of birth, ID band Patient awake    Reviewed: Allergy & Precautions, H&P , NPO status , Patient's Chart, lab work & pertinent test results  History of Anesthesia Complications Negative for: history of anesthetic complications  Airway Mallampati: I  TM Distance: >3 FB Neck ROM: Full    Dental  (+) Edentulous Upper, Edentulous Lower, Dental Advisory Given   Pulmonary COPD, former smoker   Pulmonary exam normal        Cardiovascular Exercise Tolerance: Poor hypertension, + CAD, + CABG, + Peripheral Vascular Disease and +CHF  Normal cardiovascular exam+ dysrhythmias Atrial Fibrillation + Valvular Problems/Murmurs AS   ECHO 05/23/2022: IMPRESSIONS   1. Left ventricular ejection fraction, by estimation, is 40 to 45%. The  left ventricle has mildly decreased function.   2. Right ventricular systolic function is normal. The right ventricular  size is normal.   3. Left atrial size was moderately dilated. No left atrial/left atrial  appendage thrombus was detected.   4. Right atrial size was mildly dilated.   5. The mitral valve is normal in structure. Mild mitral valve  regurgitation.   6. Visually AS appears severe but by gradient measures in mild to  moderate range. Unable to planimeter AoV orifcie as it was very eccentric.  The aortic valve is tricuspid. There is severe calcifcation of the aortic  valve. Aortic valve regurgitation is  trivial. Moderate to severe aortic valve stenosis. Aortic valve mean  gradient measures 13.3 mmHg. Aortic valve Vmax measures 2.26 m/s.   7. There is Severe (Grade IV) plaque involving the descending aorta.   8. Successful DC-CV    Subclavian artery stenosis, left s/p stent  02/2021 Cath: LM 100, RCA 90ost, 112m, free LIMA->LAD nl, VG->OM2 nl, VG->RPDA nl. Mod AS (mean grad , AVA 1.1cm^2). RHC w/ elev filling pressures.  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 06/2011 Echo: EF 35-40%; b. 06/2012 Echo: EF 50%; c.               12/2016 Echo: EF 55-60%, no rwma, GRI DD, mild AS/MR, nl               RV fxn, nl RVSP; d. 02/2021 Echo: EF 55%, GrII DD. Mild               MR. Mild to mod TR. Sev AS by VTI. No date: AAA (abdominal aortic aneurysm) (HCC)     Comment:  a. 05/2020 Abd u/s: 3.6cm. 05/01/2013: Aortic calcification (HCC) No date: Bilateral renal artery stenosis (HCC)     Comment:  a. 06/2013 Angio/PTA: LRA: 95 (5x18 Herculink stent), RRA              60ost; b. 04/2021 Angio: mod RRA stenosis w/ patent LRA               stent. No date: Carotid arterial disease (HCC)     Comment:  a. 05/2020 Carotid U/S: RICA 1-39%, LICA 40-59% No date: CHF (congestive heart failure) (HCC) No date:  Chronic kidney disease No date: COPD (chronic obstructive pulmonary disease) (HCC) No date: Coronary artery disease     Comment:  a. 2013 s/p CABG x 3 (LIMA->LAD, VG->OM, VG->RPDA; b.               06/2012 MV: no ischemia; c. 02/2021 Cath: LM 100, RCA               90ost, 195m, free LIMA->LAD nl, VG->OM2 nl, VG->RPDA nl.               Mod AS (mean grad , AVA 1.1cm^2). RHC w/ elev               filling pressures. 04/11/2008: History of prior cigarette smoking     Comment:  Qualifier: Diagnosis of  By: Ermalene Searing MD, Amy   No date: Hyperlipidemia No date: Hypertension No date: Hypothyroidism 07/12/2012: Intraventricular hemorrhage (HCC) No date: Ischemic cardiomyopathy      Comment:  a. 06/2011 Echo: EF 35-40%; b. 06/2012 Echo: EF 50%; c.               12/2016 Echo: EF 55-60%, no rwma, GRI DD; d. 02/2021 Echo:              Ef 55%, GrII DD. No date: Moderate aortic stenosis     Comment:  a. 02/2021 Echo: EF 55%, GrII DD. Sev Ca2+ of AoV. Sev               AS w/ AVA by VTI 0.79cm^2; b. 02/2021 Cath: Mod AS w/               mean grad . AVA 1.1cm^2. No date: PAF (paroxysmal atrial fibrillation) (HCC) No date: Peripheral arterial disease (HCC)     Comment:  a. Previous left lower extremity stenting by Dr. Evette Cristal;              b. 12/2012 s/p bilat ostial common iliac stenting; c.               04/2021 Angio: Patent LRA stent, mod RRA stenosis. Small               AAA. Sev plaque RSFA 2/ subtl occl of inf branch of               profunda. Diff Ca2+ SFA dzs w/ collats from profunda and               3V runoff. Diffuse LSFA dzs w/ collats from profunda.               Subtl PT dzs. 3V runoff-->Med Rx. 08/13/2012: Right leg DVT (HCC) 05/2012: Subclavian artery stenosis, left (HCC)     Comment:  Status post stenting of the ostium and self-expanding               stent placement to the left axillary artery No date: Venous insufficiency  Past Surgical History: 07/10/2013: ABDOMINAL ANGIOGRAM     Comment:  WITH BI-FEMORAL RUNOFF       DR Kirke Corin 12/26/2012: ABDOMINAL AORTAGRAM; N/A     Comment:  Procedure: ABDOMINAL AORTAGRAM;  Surgeon: [name], MD;  Location: [facility];  Service:               Cardiovascular;  Laterality: N/A; 07/10/2013: ABDOMINAL AORTAGRAM; N/A     Comment:  Procedure: ABDOMINAL AORTAGRAM;  Surgeon: Chelsea Aus  Kirke Corin, MD;  Location: [facility];  Service:               Cardiovascular;  Laterality: N/A; 05/12/2021: ABDOMINAL AORTOGRAM W/LOWER EXTREMITY; N/A     Comment:  Procedure: ABDOMINAL AORTOGRAM W/LOWER EXTREMITY;                Surgeon: [name], MD;  Location: [facility]              [facility];   Service: Cardiovascular;  Laterality: N/A; 05/2012: ANGIOPLASTY / STENTING FEMORAL 12/26/2012: ANGIOPLASTY / STENTING ILIAC; Bilateral 06/20/2012: ARCH AORTOGRAM; N/A     Comment:  Procedure: ARCH AORTOGRAM;  Surgeon: [name],               MD;  Location: [facility];  Service: Cardiovascular;                Laterality: N/A; 05/2012: CARDIAC CATHETERIZATION     Comment:  [facility] 1990[facility]: CHOLECYSTECTOMY 07/11/2011: CORONARY ARTERY BYPASS GRAFT     Comment:  Procedure: CORONARY ARTERY BYPASS GRAFTING (CABG);                Surgeon: [name], MD;  Location: [facility] OR;                Service: Open Heart Surgery;  Laterality: N/A;  Times 3.               On Pump. Using right greater saphenous vein and left               internal mammary artery.  No date: CORONARY ARTERY BYPASS GRAFT No date: HERNIA REPAIR 12/26/2012: INSERTION OF ILIAC STENT; Bilateral     Comment:  Procedure: INSERTION OF ILIAC STENT;  Surgeon: [name], MD;  Location: [facility];  Service:               Cardiovascular;  Laterality: Bilateral;  Bilateral Common              Iliac Artery 07/07/2011: LEFT AND RIGHT HEART CATHETERIZATION WITH CORONARY  ANGIOGRAM     Comment:  Procedure: LEFT AND RIGHT HEART CATHETERIZATION WITH               CORONARY ANGIOGRAM;  Surgeon: [name], MD;                Location: [facility];  Service: Cardiovascular;; 12/30/2016: LOWER EXTREMITY VENOGRAPHY; Bilateral     Comment:  Procedure: Lower Extremity Venography;  Surgeon:               Renford Dills, MD;  Location: [facility] INVASIVE CV LAB;               Service: Cardiovascular;  Laterality: Bilateral; 06/20/2012: PERCUTANEOUS STENT INTERVENTION     Comment:  Procedure: PERCUTANEOUS STENT INTERVENTION;  Surgeon:               [name], MD;  Location: [facility];  Service:               Cardiovascular;; 07/10/2013: RENAL ARTERY ANGIOPLASTY; Left     Comment:  DR Kirke Corin 03/15/2021:  RIGHT/LEFT HEART CATH AND CORONARY/GRAFT ANGIOGRAPHY; N/A     Comment:  Procedure: RIGHT/LEFT HEART CATH AND CORONARY/GRAFT               ANGIOGRAPHY;  Surgeon: [name], MD;  Location:               [facility] INVASIVE CV LAB;  Service: Cardiovascular;                Laterality: N/A; 05/2012: SUBCLAVIAN ARTERY STENT     Comment:  "2 stents" (12/26/2012) Spring 2017: TOOTH EXTRACTION     Comment:  mulitple (6) 07/2012: VENA CAVA FILTER PLACEMENT     Comment:  Removable     Reproductive/Obstetrics negative OB ROS                             Anesthesia Physical Anesthesia Plan  ASA: 4  Anesthesia Plan: General   Post-op Pain Management: Tylenol PO (pre-op)*   Induction: Intravenous  PONV Risk Score and Plan: 2 and Ondansetron and Dexamethasone  Airway Management Planned: Oral ETT  Additional Equipment: Arterial line  Intra-op Plan:   Post-operative Plan: Extubation in OR  Informed Consent: I have reviewed the patients History and Physical, chart, labs and discussed the procedure including the risks, benefits and alternatives for the proposed anesthesia with the patient or authorized representative who has indicated his/her understanding and acceptance.     Dental advisory given  Plan Discussed with: Anesthesiologist, CRNA and Surgeon  Anesthesia Plan Comments:         Anesthesia Quick Evaluation

## 2022-10-18 NOTE — Anesthesia Procedure Notes (Signed)
Arterial Line Insertion Start/End6/25/2024 11:30 AM, 10/18/2022 11:40 AM Performed by: Lonia Mad, CRNA, CRNA  Patient location: Pre-op. Preanesthetic checklist: patient identified, IV checked, site marked, risks and benefits discussed, surgical consent, monitors and equipment checked, pre-op evaluation, timeout performed and anesthesia consent Lidocaine 1% used for infiltration Right, radial was placed Catheter size: 20 G Hand hygiene performed , maximum sterile barriers used  and Seldinger technique used Allen's test indicative of satisfactory collateral circulation Attempts: 1 Procedure performed without using ultrasound guided technique. Following insertion, dressing applied and Biopatch. Post procedure assessment: normal and unchanged  Patient tolerated the procedure well with no immediate complications.

## 2022-10-18 NOTE — Progress Notes (Addendum)
ANTICOAGULATION CONSULT NOTE  Pharmacy Consult for heparin Indication: critical limb ischemia, hx afib/DVT  Allergies  Allergen Reactions   Plavix [Clopidogrel Bisulfate]     Brain hemorrhage prev while on plavix and aspirin    Patient Measurements: Height: 5\' 9"  (175.3 cm) Weight: 68 kg (149 lb 14.6 oz) IBW/kg (Calculated) : 70.7 Heparin Dosing Weight: 68 kg  Vital Signs: Temp: 97.4 F (36.3 C) (06/25 0158) Temp Source: Oral (06/24 2109) BP: 152/85 (06/25 0448) Pulse Rate: 59 (06/25 0448)  Labs: Recent Labs    10/16/22 0303 10/16/22 1114 10/16/22 2018 10/17/22 0308 10/18/22 0152  HGB 12.9*  --   --  12.0* 12.2*  HCT 41.1  --   --  40.2 39.4  PLT 372  --   --  365 359  HEPARINUNFRC <0.10*   < > 0.36 0.39 0.52  CREATININE 1.41*  --   --  1.55* 1.66*   < > = values in this interval not displayed.     Estimated Creatinine Clearance: 33.6 mL/min (A) (by C-G formula based on SCr of 1.66 mg/dL (H)).   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 06/2011 Echo: EF 35-40%; b. 06/2012 Echo: EF 50%; c. 12/2016 Echo: EF 55-60%, no rwma, GRI DD, mild AS/MR, nl RV fxn, nl RVSP; d. 02/2021 Echo: EF 55%, GrII DD. Mild MR. Mild to mod TR. Sev AS by VTI.   AAA (abdominal aortic aneurysm) (HCC)    a. 05/2020 Abd u/s: 3.6cm.   Aortic calcification (HCC) 05/01/2013   Bilateral renal artery stenosis (HCC)    a. 06/2013 Angio/PTA: LRA: 95 (5x18 Herculink stent), RRA 60ost; b. 04/2021 Angio: mod RRA stenosis w/ patent LRA stent.   Carotid arterial disease (HCC)    a. 05/2020 Carotid U/S: RICA 1-39%, LICA 40-59%   CHF (congestive heart failure) (HCC)    Chronic kidney disease    COPD (chronic obstructive pulmonary disease) (HCC)    Coronary artery disease    a. 2013 s/p CABG x 3 (LIMA->LAD, VG->OM, VG->RPDA; b. 06/2012 MV: no ischemia; c. 02/2021 Cath: LM 100, RCA 90ost, 137m, free LIMA->LAD nl, VG->OM2 nl,  VG->RPDA nl. Mod AS (mean grad , AVA 1.1cm^2). RHC w/ elev filling pressures.   History of prior cigarette smoking 04/11/2008   Qualifier: Diagnosis of  By: Ermalene Searing MD, Amy     Hyperlipidemia    Hypertension    Hypothyroidism    Intraventricular hemorrhage (HCC) 07/12/2012   Ischemic cardiomyopathy    a. 06/2011 Echo: EF 35-40%; b. 06/2012 Echo: EF 50%; c. 12/2016 Echo: EF 55-60%, no rwma, GRI DD; d. 02/2021 Echo: Ef 55%, GrII DD.   Moderate aortic stenosis    a. 02/2021 Echo: EF 55%, GrII DD. Sev Ca2+ of AoV. Sev AS w/ AVA by VTI 0.79cm^2; b. 02/2021 Cath: Mod AS w/ mean grad . AVA 1.1cm^2.   PAF (paroxysmal atrial fibrillation) (HCC)    Peripheral arterial disease (HCC)    a. Previous left lower extremity stenting by Dr. Evette Cristal;  b. 12/2012 s/p bilat ostial common iliac stenting; c. 04/2021 Angio: Patent LRA stent, mod RRA stenosis. Small AAA. Sev plaque RSFA 2/ subtl occl of inf branch of profunda. Diff Ca2+ SFA dzs w/ collats from profunda and 3V runoff. Diffuse LSFA dzs w/ collats from profunda. Subtl PT dzs. 3V runoff-->Med Rx.   Right leg DVT (HCC) 08/13/2012   Subclavian artery stenosis, left (HCC) 05/2012  Status post stenting of the ostium and self-expanding stent placement to the left axillary artery   Venous insufficiency     Medications:  Medications Prior to Admission  Medication Sig Dispense Refill Last Dose   albuterol (VENTOLIN HFA) 108 (90 Base) MCG/ACT inhaler Inhale 2 puffs into the lungs every 6 (six) hours as needed for wheezing or shortness of breath. 18 g 0 Past Month   amiodarone (PACERONE) 200 MG tablet TAKE 1 TABLET BY MOUTH TWICE A DAY 180 tablet 1 10/11/2022   [EXPIRED] amoxicillin-clavulanate (AUGMENTIN) 875-125 MG tablet Take 1 tablet by mouth 2 (two) times daily.   10/10/2022   atorvastatin (LIPITOR) 20 MG tablet TAKE 1 TABLET BY MOUTH EVERY DAY 30 tablet 5 10/11/2022   augmented betamethasone dipropionate (DIPROLENE-AF) 0.05 % cream Apply 1 application   topically 2 (two) times daily as needed (breakouts).   Past Week   empagliflozin (JARDIANCE) 10 MG TABS tablet Take 1 tablet (10 mg total) by mouth daily before breakfast. 30 tablet 3 10/11/2022   furosemide (LASIX) 40 MG tablet Take 1 tablet (40 mg total) by mouth 2 (two) times daily. 60 tablet 3 10/11/2022   levothyroxine (SYNTHROID) 150 MCG tablet TAKE ONE TABLET BY MOUTH EVERY DAY BEFORE BREAKFAST (Patient taking differently: Take 150 mcg by mouth daily before breakfast. TAKE ONE TABLET BY MOUTH EVERY DAY BEFORE BREAKFAST) 90 tablet 3 10/11/2022   warfarin (COUMADIN) 5 MG tablet Take 2.5 mg by mouth See admin instructions. Take 2.5 mg by mouth once daily except Saturday.   10/10/2022 at 6 pm   Scheduled:   amiodarone  200 mg Oral BID   atorvastatin  20 mg Oral Daily   furosemide  40 mg Oral BID   levothyroxine  150 mcg Oral Q0600   metoprolol succinate  25 mg Oral BID   potassium chloride  40 mEq Oral Daily   sodium chloride flush  3 mL Intravenous Q12H   Assessment: 82 yom with hx afib/DVT s/p IVC filter on warfarin PTA presenting with critical limb ischemia. Warfarin held and heparin started with need for procedures.  Heparin level is therapeutic at 0.52, CBC stable, VVS angio planned today.   Goal of Therapy:  Heparin level 0.3-0.7 units/ml Monitor platelets by anticoagulation protocol: Yes   Plan:  Continue heparin 1700 units/h - stop at 1000 preop Daily heparin level and CBC  Fredonia Highland, PharmD, BCPS, [facility] Clinical Pharmacist [phone] Please check AMION for all [facility] Pharmacy numbers 10/18/2022

## 2022-10-18 NOTE — Anesthesia Procedure Notes (Signed)
Procedure Name: Intubation Date/Time: 10/18/2022 12:20 PM  Performed by: Darryl Nestle, CRNAPre-anesthesia Checklist: Patient identified, Emergency Drugs available, Suction available and Patient being monitored Patient Re-evaluated:Patient Re-evaluated prior to induction Oxygen Delivery Method: Circle system utilized Preoxygenation: Pre-oxygenation with 100% oxygen Induction Type: IV induction Ventilation: Mask ventilation without difficulty Laryngoscope Size: Mac and 3 Grade View: Grade II Tube type: Oral Tube size: 7.0 mm Number of attempts: 1 Airway Equipment and Method: Stylet and Oral airway Placement Confirmation: ETT inserted through vocal cords under direct vision, positive ETCO2 and breath sounds checked- equal and bilateral Secured at: 23 cm Tube secured with: Tape Dental Injury: Teeth and Oropharynx as per pre-operative assessment

## 2022-10-18 NOTE — H&P (View-Only) (Signed)
  Progress Note    10/18/2022 7:40 AM 4 Days Post-Op  Subjective:  no complaints   Vitals:   10/18/22 0158 10/18/22 0448  BP: (!) 144/62 (!) 152/85  Pulse: 60 (!) 59  Resp:  14  Temp: (!) 97.4 F (36.3 C)   SpO2: 92% 98%   Physical Exam: Lungs:  non labored Extremities:  dressing R foot Neurologic: A&O  CBC    Component Value Date/Time   WBC 10.8 (H) 10/18/2022 0152   RBC 4.62 10/18/2022 0152   HGB 12.2 (L) 10/18/2022 0152   HGB 14.4 05/06/2021 0953   HCT 39.4 10/18/2022 0152   HCT 42.6 05/06/2021 0953   PLT 359 10/18/2022 0152   PLT 199 05/06/2021 0953   MCV 85.3 10/18/2022 0152   MCV 87 05/06/2021 0953   MCV 87 06/03/2013 0454   MCH 26.4 10/18/2022 0152   MCHC 31.0 10/18/2022 0152   RDW 18.3 (H) 10/18/2022 0152   RDW 13.6 05/06/2021 0953   RDW 14.2 06/03/2013 0454   LYMPHSABS 1.1 10/11/2022 1056   LYMPHSABS 1.8 05/06/2021 0953   LYMPHSABS 2.7 07/28/2012 0513   MONOABS 0.9 10/11/2022 1056   MONOABS 1.4 (H) 07/28/2012 0513   EOSABS 0.2 10/11/2022 1056   EOSABS 0.3 05/06/2021 0953   EOSABS 0.3 07/28/2012 0513   BASOSABS 0.1 10/11/2022 1056   BASOSABS 0.1 05/06/2021 0953   BASOSABS 1 06/03/2013 0454   BASOSABS 0.1 07/28/2012 0513    BMET    Component Value Date/Time   NA 140 10/18/2022 0152   NA 142 05/06/2021 0953   NA 136 06/03/2013 0454   K 3.6 10/18/2022 0152   K 4.1 06/03/2013 0454   CL 106 10/18/2022 0152   CL 106 06/03/2013 0454   CO2 25 10/18/2022 0152   CO2 27 06/03/2013 0454   GLUCOSE 103 (H) 10/18/2022 0152   GLUCOSE 80 06/03/2013 0454   BUN 23 10/18/2022 0152   BUN 22 05/06/2021 0953   BUN 22 (H) 06/03/2013 0454   CREATININE 1.66 (H) 10/18/2022 0152   CREATININE 1.59 (H) 06/03/2013 0454   CREATININE 1.02 04/22/2011 1507   CALCIUM 8.3 (L) 10/18/2022 0152   CALCIUM 8.8 06/03/2013 0454   GFRNONAA 41 (L) 10/18/2022 0152   GFRNONAA 43 (L) 06/03/2013 0454   GFRAA 59 (L) 12/30/2016 0103   GFRAA 50 (L) 06/03/2013 0454    INR     Component Value Date/Time   INR 1.7 (H) 10/14/2022 0617   INR 1.3 06/02/2013 0746     Intake/Output Summary (Last 24 hours) at 10/18/2022 0740 Last data filed at 10/18/2022 0737 Gross per 24 hour  Intake --  Output 1950 ml  Net -1950 ml     Assessment/Plan:  82 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 [name].  Case was reviewed with the patient and all questions were answered.  He is agreeable to proceed. Continue npo. Consent ordered   Emilie Rutter, PA-C [facility] [phone] 10/18/2022 7:40 AM

## 2022-10-18 NOTE — Plan of Care (Signed)
  Problem: Education: Goal: Understanding of CV disease, CV risk reduction, and recovery process will improve Outcome: Progressing   Problem: Activity: Goal: Ability to return to baseline activity level will improve Outcome: Progressing   Problem: Health Behavior/Discharge Planning: Goal: Ability to manage health-related needs will improve Outcome: Progressing   Problem: Activity: Goal: Risk for activity intolerance will decrease Outcome: Progressing   Problem: Pain Managment: Goal: General experience of comfort will improve Outcome: Progressing

## 2022-10-18 NOTE — Interval H&P Note (Signed)
History and Physical Interval Note:  10/18/2022 11:48 AM  [name]  has presented today for surgery, with the diagnosis of Peripheral artery disease with ulceration right foot.  The various methods of treatment have been discussed with the patient and family. After consideration of risks, benefits and other options for treatment, the patient has consented to  Procedure(s): RIGHT COMMON FEMORAL ENDARTERECTOMY (Right) RIGHT FEMORAL-BELOW KNEE POPLITEAL ARTERY BYPASS WITH PTFE (Right) as a surgical intervention.  The patient's history has been reviewed, patient examined, no change in status, stable for surgery.  I have reviewed the patient's chart and labs.  Questions were answered to the patient's satisfaction.     Waverly Ferrari

## 2022-10-18 NOTE — Progress Notes (Signed)
PROGRESS NOTE    [name]  OEV:[id number] DOB: 12/22/1940 DOA: 10/11/2022 PCP: [name], MD   Brief Narrative:  [name] is a 82 y.o. male with medical history significant of PVD status post left subclavian artery and left axillary artery stenting,, CAD status post CABG, CKD stage IIIb, severe AO, PAF and history of DVT on Coumadin and IVC filter, chronic HFrEF presented with worsening of right foot ulcer pain and discoloration.   6/19: Cardiology surgical clearance. INR elevated-Coumadin held, vitamin K given per surgery. 6/20: Zosyn initiated, amiodarone metoprolol and Lasix on hold 6/21-23: Medically stable, pain controlled.  6/25: Right common femoral endarterectomy and right common femoral to below knee popliteal artery bypass with PTFE in the OR today with Dr. [name]  Assessment & Plan:   Principal Problem:   Critical limb ischemia of right lower extremity with ulceration of foot (HCC) Active Problems:   Abdominal aortic aneurysm (HCC)   PAF (paroxysmal atrial fibrillation) (HCC)   DVT (deep venous thrombosis) (HCC)   Ischemic cardiomyopathy   Peripheral arterial disease (HCC)   Aortic valve stenosis   CKD (chronic kidney disease) stage 3, GFR 30-59 ml/min (HCC)   HTN (hypertension)   COPD (chronic obstructive pulmonary disease) (HCC)   Hypothyroidism   Critical limb ischemia of right lower extremity with ulceration of foot (HCC) -Vascular surgery following, tentative plan for surgery 6/25 with prosthetic graft -Angiography confirmed poor vasculature -Patient high risk for surgery based on HFrEF, severe aortic stenosis and CAD. -Continue Zosyn (started 6/20) -Continue with pain management   Abdominal aortic aneurysm (HCC) 5.5 cm on recent imaging.  Enlarging per prior image comparison. Vascular surgery is on board and does not think that he will be a candidate for endovascular repair, likely will require open surgery which is going to be very high  risk. -Observe for now -repeat imaging and follow-up per vascular   PAF (paroxysmal atrial fibrillation) (HCC) currently sinus rhythm -Failed DCCV in January 2024 with early return in atrial fibrillation.  -Amiodarone, repeat cardioversion February 24 -Metoprolol ongoing, transiently held due to bradycardia -Amiodarone 200mg  daily.  -On chronic anticoagulation with Coumadin -on hold perioperatively  DVT (deep venous thrombosis) (HCC) History of DVT.  Recent vascular ultrasound was negative for DVT in right lower extremity. -Coumadin is under hold for procedure   Ischemic cardiomyopathy Echo January 2024 with EF of 40 to 45%.   Euvolemic on exam. -Continue Lasix, metoprolol -no ACE/ARB/Arni in the setting of renal function -Jardiance on hold perioperatively  Peripheral arterial disease (HCC) -Chronic history -Prior bilateral, iliac stenting -Aortogram shows severe bilateral femoral-popliteal artery disease -with prior stenting, chronic occlusion of left renal artery stent noted.  .  Aortic valve stenosis History of severe aortic stenosis. Not a candidate for repair/replacement given high risk   CKD (chronic kidney disease) stage 3b, GFR 30-59 ml/min (HCC) Creatinine at 1.51 today, baseline appears to be around 1.6-1.8. -Monitor renal function   HTN (hypertension) Blood pressure within goal. -Continue home Lasix and metoprolol   COPD (chronic obstructive pulmonary disease) (HCC) No acute concern. -As needed bronchodilator   Hypothyroidism -Continue Synthroid   DVT prophylaxis: Heparin drip  Code Status:   Code Status: Full Code Family Communication: None present  Status is: Inpt  Dispo: The patient is from: Home              Anticipated d/c is to: To be determined              Anticipated  d/c date is: Pending surgical evaluation              Patient currently not medically stable for discharge given ongoing need for further evaluation and treatment and planned  surgery as above  Consultants:  Vascular surgery  Procedures:  Aortogram 6/21 Tentatively planned artery bypass w/ graft  Antimicrobials:  Perioperatively  Subjective: No acute issues or events overnight denies nausea vomiting diarrhea constipation any fevers chills or chest pain  Objective: Vitals:   10/17/22 2109 10/18/22 0157 10/18/22 0158 10/18/22 0448  BP: 116/66  (!) 144/62 (!) 152/85  Pulse: (!) 59  60 (!) 59  Resp: 18 (!) 21  14  Temp: (!) 97.5 F (36.4 C)  (!) 97.4 F (36.3 C)   TempSrc: Oral     SpO2: 94%  92% 98%  Weight:      Height:        Intake/Output Summary (Last 24 hours) at 10/18/2022 0805 Last data filed at 10/18/2022 0737 Gross per 24 hour  Intake --  Output 1950 ml  Net -1950 ml    Filed Weights   10/11/22 1110  Weight: 68 kg    Examination:  General:  Pleasantly resting in bed, No acute distress. HEENT:  Normocephalic atraumatic.  Sclerae nonicteric, noninjected.  Extraocular movements intact bilaterally. Neck:  Without mass or deformity.  Trachea is midline. Lungs:  Clear to auscultate bilaterally without rhonchi, wheeze, or rales. Heart:  Regular rate and rhythm.  Without murmurs, rubs, or gallops. Abdomen:  Soft, nontender, nondistended.  Without guarding or rebound. Extremities: Right foot multiple toes dark, markedly painful to palpate Skin:  Warm and dry, no erythema -dusky right foot  Data Reviewed: I have personally reviewed following labs and imaging studies  CBC: Recent Labs  Lab 10/11/22 1056 10/11/22 1205 10/14/22 0229 10/15/22 0757 10/16/22 0303 10/17/22 0308 10/18/22 0152  WBC 10.2   < > 11.7* 11.5* 9.6 10.8* 10.8*  NEUTROABS 7.8*  --   --   --   --   --   --   HGB 12.4*   < > 12.4* 13.5 12.9* 12.0* 12.2*  HCT 40.7   < > 41.0 43.1 41.1 40.2 39.4  MCV 86.4   < > 85.8 86.4 84.2 86.5 85.3  PLT 336   < > 347 375 372 365 359   < > = values in this interval not displayed.    Basic Metabolic Panel: Recent Labs   Lab 10/14/22 0229 10/15/22 0757 10/16/22 0303 10/17/22 0308 10/18/22 0152  NA 138 141 139 138 140  K 3.7 3.5 3.2* 3.2* 3.6  CL 103 103 104 105 106  CO2 26 28 25 24 25   GLUCOSE 108* 106* 94 100* 103*  BUN 18 18 19 23 23   CREATININE 1.48* 1.49* 1.41* 1.55* 1.66*  CALCIUM 8.1* 8.3* 8.1* 8.0* 8.3*  MG  --  2.4  --   --   --     GFR: Estimated Creatinine Clearance: 33.6 mL/min (A) (by C-G formula based on SCr of 1.66 mg/dL (H)). Liver Function Tests: No results for input(s): "AST", "ALT", "ALKPHOS", "BILITOT", "PROT", "ALBUMIN" in the last 168 hours. No results for input(s): "LIPASE", "AMYLASE" in the last 168 hours. No results for input(s): "AMMONIA" in the last 168 hours. Coagulation Profile: Recent Labs  Lab 10/11/22 1056 10/12/22 0044 10/13/22 0011 10/14/22 0617  INR 2.5* 2.8* 1.8* 1.7*    Cardiac Enzymes: No results for input(s): "CKTOTAL", "CKMB", "CKMBINDEX", "TROPONINI" in the last 168  hours. BNP (last 3 results) No results for input(s): "PROBNP" in the last 8760 hours. HbA1C: No results for input(s): "HGBA1C" in the last 72 hours. CBG: Recent Labs  Lab 10/11/22 1657 10/11/22 1742 10/12/22 0006 10/12/22 0751  GLUCAP 119* 120* 86 93    Lipid Profile: No results for input(s): "CHOL", "HDL", "LDLCALC", "TRIG", "CHOLHDL", "LDLDIRECT" in the last 72 hours.  Thyroid Function Tests: No results for input(s): "TSH", "T4TOTAL", "FREET4", "T3FREE", "THYROIDAB" in the last 72 hours. Anemia Panel: No results for input(s): "VITAMINB12", "FOLATE", "FERRITIN", "TIBC", "IRON", "RETICCTPCT" in the last 72 hours. Sepsis Labs: No results for input(s): "PROCALCITON", "LATICACIDVEN" in the last 168 hours.  No results found for this or any previous visit (from the past 240 hour(s)).       Radiology Studies: No results found.  Scheduled Meds:  amiodarone  200 mg Oral BID   atorvastatin  20 mg Oral Daily   furosemide  40 mg Oral BID   levothyroxine  150 mcg Oral  Q0600   metoprolol succinate  25 mg Oral BID   potassium chloride  40 mEq Oral Daily   sodium chloride flush  3 mL Intravenous Q12H   Continuous Infusions:  sodium chloride     heparin 1,700 Units/hr (10/17/22 1942)   piperacillin-tazobactam (ZOSYN)  IV 3.375 g (10/18/22 0536)     LOS: 7 days   Time spent:  Azucena Fallen, DO [facility]  If 7PM-7AM, please contact night-coverage [email or web address]  10/18/2022, 8:05 AM

## 2022-10-18 NOTE — Progress Notes (Signed)
ANTICOAGULATION CONSULT NOTE  Pharmacy Consult for heparin Indication: critical limb ischemia, hx afib/DVT  Allergies  Allergen Reactions   Plavix [Clopidogrel Bisulfate]     Brain hemorrhage prev while on plavix and aspirin    Patient Measurements: Height: 5\' 9"  (175.3 cm) Weight: 68 kg (149 lb 14.6 oz) IBW/kg (Calculated) : 70.7 Heparin Dosing Weight: 68 kg  Vital Signs: Temp: 97.6 F (36.4 C) (06/25 0827) Temp Source: Oral (06/25 0827) BP: 121/53 (06/25 1038) Pulse Rate: 60 (06/25 1038)  Labs: Recent Labs    10/16/22 0303 10/16/22 1114 10/16/22 2018 10/17/22 0308 10/18/22 0152  HGB 12.9*  --   --  12.0* 12.2*  HCT 41.1  --   --  40.2 39.4  PLT 372  --   --  365 359  HEPARINUNFRC <0.10*   < > 0.36 0.39 0.52  CREATININE 1.41*  --   --  1.55* 1.66*   < > = values in this interval not displayed.     Estimated Creatinine Clearance: 33.6 mL/min (A) (by C-G formula based on SCr of 1.66 mg/dL (H)).   Assessment: 40 yom with hx afib/DVT s/p IVC filter on warfarin PTA presenting with critical limb ischemia. Warfarin held and heparin started with need for procedures.  Heparin turned off at 09:59 this morning for VVS procedure. Per d/w Emilie Rutter, PA-c - plan to resume heparin without a bolus 8hrs post-op.   Goal of Therapy:  Heparin level 0.3-0.7 units/ml Monitor platelets by anticoagulation protocol: Yes   Plan:  At 01:00 tomorrow 6/26: Resume heparin 1700 units/h without a bolus Check heparin level 8hrs after starting at 09:00 Daily heparin level and CBC Follow up plans for resuming warfarin  Loralee Pacas, PharmD, BCPS 10/18/2022 4:56 PM  Please check AMION for all [facility] Pharmacy phone numbers After 10:00 PM, call Main Pharmacy [phone]

## 2022-10-18 NOTE — Transfer of Care (Signed)
Immediate Anesthesia Transfer of Care Note  Patient: [name]  Procedure(s) Performed: RIGHT ILIOFEMORAL ENDARTERECTOMY AND RIGHT POPLITEAL ENDARTERECTOMY (Right: Leg Upper) RIGHT FEMORAL-BELOW KNEE POPLITEAL ARTERY BYPASS WITH PROPATEN REMOVABLE RING VASCULAR GRAFT (Right: Leg Upper) VEIN PATCH ANGIOPLASTY (Right: Leg Upper) VEIN HARVEST OF GREATER SAPHENOUS VEIN (Right: Leg Lower) APPLICATION OF WOUND VAC (Right: Groin)  Patient Location: PACU  Anesthesia Type:General  Level of Consciousness: awake, alert , and oriented  Airway & Oxygen Therapy: Patient Spontanous Breathing and Patient connected to face mask oxygen  Post-op Assessment: Report given to RN and Post -op Vital signs reviewed and stable  Post vital signs: Reviewed and stable  Last Vitals:  Vitals Value Taken Time  BP 151/55 10/18/22 1654  Temp 98   Pulse 57 10/18/22 1700  Resp 20 10/18/22 1700  SpO2 100 % 10/18/22 1700  Vitals shown include unvalidated device data.  Last Pain:  Vitals:   10/18/22 1038  TempSrc:   PainSc: 0-No pain      Patients Stated Pain Goal: 1 (10/16/22 2036)  Complications: No notable events documented.

## 2022-10-18 NOTE — Op Note (Signed)
NAME: [name]    MRN: [id number] DOB: July 06, 1940    DATE OF OPERATION: 10/18/2022  PREOP DIAGNOSIS:    Multilevel arterial occlusive disease with nonhealing wounds right foot  POSTOP DIAGNOSIS:    Same  PROCEDURE:    Right iliofemoral endarterectomy Harvesting right great saphenous vein Vein patch angioplasty of right common femoral artery Popliteal endarterectomy Right femoral to below-knee popliteal artery bypass (PTFE) Placement of Prevena dressing  SURGEON: Di Kindle. [name], MD  ASSIST: [name], PA, Aggie Moats PA  ANESTHESIA: General  EBL: 100 cc  INDICATIONS:    [name] is a 82 y.o. male who presented with nonhealing wounds of the right foot.  He underwent an arteriogram had multilevel arterial occlusive disease.  It was felt that his only chance for limb salvage was attempted revascularization.  FINDINGS:   Diffuse calcific disease in the external iliac artery common femoral artery, and the femoral artery.  Diffuse calcific disease in the popliteal artery and proximal tibial vessels  TECHNIQUE:   Patient was taken to the operating room and received general anesthetic to the right lower extremity was prepped and draped in usual sterile fashion.  A longitudinal incision was made in the right groin.  The dissection was carried down to the common femoral artery which was significantly calcified and diseased.  I dissected under the inguinal ligament to expose the external iliac artery up fairly high where I felt that I could clamp this.  The superficial epigastric vessels were controlled in the deep femoral artery branches and superficial femoral arteries were controlled.  Through this incision I dissected the saphenous vein from the saphenofemoral junction down to the junction of the proximal and mid thigh.  Branches were divided tween clips and 3-0 silk ties.  Next a medial incision was made along the medial aspect of the right leg below the  knee.  Through this incision expose the below-knee popliteal artery which was significantly calcified.  This was extended down to the proximal tibioperoneal trunk and the origin of the anterior tibial artery was also done 5.  All these vessels were markedly calcified.  A tunnel was then created between these 2 incisions and the patient was heparinized.  A 6 mm ringed PTFE graft was tunneled between the 2 incisions.  In the right groin the external iliac artery was clamped and the side branches were controlled.  The superficial femoral artery was occluded.  The deep femoral artery was controlled.  A longitudinal arteriotomy was made in the common femoral artery extending slightly onto the smaller deep femoral artery branch laterally.  An endarterectomy plane was established and the plaque sharply divided.  Eversion endarterectomy was performed proximally and there was a good endpoint.  There was excellent inflow at this point.  All loose debris was removed.  The vein was opened longitudinally and sewn as a vein patch using continuous 5-0 Prolene suture.  This extended slightly onto the small deep femoral artery branch laterally.  This artery had diffuse disease.  There was a medial branch that was larger that was chronically occluded and I was able to retrieve significant calcific plaque from this and establish some backbleeding.  The superficial femoral artery was occluded.  I did endarterectomized the proximal segment.  The vein patch was then sewn using continuous 5-0 Prolene suture.  Prior to completing anastomosis the arteries were backbled and flushed and the anastomosis completed.  The vessels were then reoccluded and a longitudinal venotomy made in  the vein patch.  The 6 millimeter PTFE graft was spatulated and sewn end-to-side to the patch using continuous 6-0 Prolene suture.   Next the tourniquet was placed on the thigh and the leg was exsanguinated with an Esmarch bandage.  The tourniquet was inflated  300 mmHg.  A longitudinal arteriotomy was made in the below-knee popliteal artery and it was difficult to identify the lumen.  I did an endarterectomy which extended slightly onto the tibial peroneal trunk.  This point there appeared to be an area where I could so.  The graft was cut to the appropriate length spatulated and sewn into side to the below-knee popliteal artery using continuous 6-0 Prolene suture.  Prior to completing the anastomosis the tourniquet was released.  There was backbleeding from the tibial vessels.  The anastomosis was completed.  At the completion there was an anterior tibial signal with a Doppler.  There was a good signal distal to the anastomosis with diastolic flow.  I did not shoot an intraoperative arteriogram as there would be nothing to do differently given his severe tibial disease and severe calcific disease.  Hemostasis was obtained in the wounds and the heparin was partially reversed with protamine.  The groin incision was closed with a deep layer of 2-0 Vicryl.  The subcutaneous layer was closed with 2 deep layers of 3-0 Vicryl.  The skin was closed with 4-0 Monocryl.  Praveena dressing was applied.  The below the knee incision was irrigated and hemostasis obtained.  This was closed with 2 deep layers of 3-0 Vicryl and the skin closed with 4-0 Monocryl.  4 inch Ace was applied for some mild pressure.  There was still a good anterior tibial signal at the completion of the procedure.  The patient tolerated the procedure well was transferred to the recovery room in stable condition.  All needle and sponge counts were correct.   Given the complexity of the case,  the assistant was necessary in order to expedient the procedure and safely perform the technical aspects of the operation.  The assistant provided traction and countertraction to assist with exposure of the artery proximally and distally.  They assisted with suture ligature of multiple branches.  Their assistance was  critical in the performance of both the proximal and distal anastomosis.These skills, especially following the Prolene suture for the anastomosis, could not have been adequately performed by a scrub tech assistant.    Waverly Ferrari, MD, FACS [facility]  DATE OF DICTATION:   10/18/2022

## 2022-10-19 ENCOUNTER — Ambulatory Visit (HOSPITAL_COMMUNITY): Admission: RE | Admit: 2022-10-19 | Payer: Medicare Other | Source: Home / Self Care | Admitting: Cardiovascular Disease

## 2022-10-19 ENCOUNTER — Encounter (HOSPITAL_COMMUNITY): Admission: RE | Payer: Self-pay | Source: Home / Self Care

## 2022-10-19 ENCOUNTER — Encounter (HOSPITAL_COMMUNITY): Payer: Self-pay | Admitting: Vascular Surgery

## 2022-10-19 DIAGNOSIS — I480 Paroxysmal atrial fibrillation: Secondary | ICD-10-CM | POA: Diagnosis not present

## 2022-10-19 DIAGNOSIS — Z01812 Encounter for preprocedural laboratory examination: Secondary | ICD-10-CM

## 2022-10-19 DIAGNOSIS — I70235 Atherosclerosis of native arteries of right leg with ulceration of other part of foot: Secondary | ICD-10-CM | POA: Diagnosis not present

## 2022-10-19 DIAGNOSIS — N1832 Chronic kidney disease, stage 3b: Secondary | ICD-10-CM | POA: Diagnosis not present

## 2022-10-19 DIAGNOSIS — I714 Abdominal aortic aneurysm, without rupture, unspecified: Secondary | ICD-10-CM | POA: Diagnosis not present

## 2022-10-19 DIAGNOSIS — Z86718 Personal history of other venous thrombosis and embolism: Secondary | ICD-10-CM

## 2022-10-19 LAB — POCT ACTIVATED CLOTTING TIME
Activated Clotting Time: 256 seconds
Activated Clotting Time: 232 seconds
Activated Clotting Time: 244 seconds

## 2022-10-19 LAB — BASIC METABOLIC PANEL
Anion gap: 9 (ref 5–15)
BUN: 22 mg/dL (ref 8–23)
CO2: 24 mmol/L (ref 22–32)
Calcium: 8.4 mg/dL — ABNORMAL LOW (ref 8.9–10.3)
Chloride: 106 mmol/L (ref 98–111)
Creatinine, Ser: 1.39 mg/dL — ABNORMAL HIGH (ref 0.61–1.24)
GFR, Estimated: 51 mL/min — ABNORMAL LOW (ref >60)
Glucose, Bld: 106 mg/dL — ABNORMAL HIGH (ref 70–99)
Potassium: 3.9 mmol/L (ref 3.5–5.1)
Sodium: 139 mmol/L (ref 135–145)

## 2022-10-19 LAB — CBC
HCT: 37.2 % — ABNORMAL LOW (ref 39.0–52.0)
Hemoglobin: 11.1 g/dL — ABNORMAL LOW (ref 13.0–17.0)
MCH: 26 pg (ref 26.0–34.0)
MCHC: 29.8 g/dL — ABNORMAL LOW (ref 30.0–36.0)
MCV: 87.1 fL (ref 80.0–100.0)
Platelets: 350 10*3/uL (ref 150–400)
RBC: 4.27 MIL/uL (ref 4.22–5.81)
RDW: 18.3 % — ABNORMAL HIGH (ref 11.5–15.5)
WBC: 13.6 10*3/uL — ABNORMAL HIGH (ref 4.0–10.5)
nRBC: 0 % (ref 0.0–0.2)

## 2022-10-19 LAB — GLUCOSE, CAPILLARY
Glucose-Capillary: 112 mg/dL — ABNORMAL HIGH (ref 70–99)
Glucose-Capillary: 81 mg/dL (ref 70–99)

## 2022-10-19 LAB — HEPARIN LEVEL (UNFRACTIONATED): Heparin Unfractionated: 0.66 IU/mL (ref 0.30–0.70)

## 2022-10-19 SURGERY — ABDOMINAL AORTOGRAM W/LOWER EXTREMITY
Anesthesia: LOCAL

## 2022-10-19 MED ORDER — CHLORHEXIDINE GLUCONATE CLOTH 2 % EX PADS
6.0000 | MEDICATED_PAD | Freq: Every day | CUTANEOUS | Status: DC
  Administered 2022-10-19 – 2022-10-31 (×13): 6 via TOPICAL

## 2022-10-19 NOTE — Progress Notes (Signed)
Patient seen and examined at bedside.  No complaints but due to low blood pressure he has been on phenylephrine.  Wife is also present at bedside.  Discussed case with PCCM who will take over the case as patient is currently on pressors and ICU.  Please call [facility] once out of the ICU.  Stephania Fragmin MD [facility] which

## 2022-10-19 NOTE — Addendum Note (Signed)
Addendum  created 10/19/22 1354 by Trevor Iha, MD   Intraprocedure Staff edited

## 2022-10-19 NOTE — Evaluation (Signed)
Physical Therapy Evaluation Patient Details Name: [name] MRN: [id number] DOB: 1941-01-24 Today's Date: 10/19/2022  History of Present Illness  82 yo m referred to ED 6/18 by physician with critical limb ischemia. He is now s/p endarterectomy/bypass on 6/25 and plans are for transmetatarsal amputation on 6/28. 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  Clinical Impression  PTA pt living with wife in multistory home with 1 step to enter and bed and bath downstairs. Pt was independent in mobility, ADLs, iADLs and driving. Pt is currently limited in safe mobility by R foot pain, decreased ROM and generalized weakness. Pt is currently modAx2 for bed mobility and min-modAx2 for transfer to standing and stepping to recliner. Pt has strong desire to return to PLOF and has good family support at discharge. Patient will benefit from intensive inpatient follow up therapy, >3 hours/day. PT will continue to follow acutely to improve strength, balance and mobility.        Recommendations for follow up therapy are one component of a multi-disciplinary discharge planning process, led by the attending physician.  Recommendations may be updated based on patient status, additional functional criteria and insurance authorization.     Assistance Recommended at Discharge Frequent or constant Supervision/Assistance  Patient can return home with the following  A lot of help with walking and/or transfers;A lot of help with bathing/dressing/bathroom;Assistance with cooking/housework;Direct supervision/assist for medications management;Direct supervision/assist for financial management;Assist for transportation;Help with stairs or ramp for  entrance    Equipment Recommendations None recommended by PT  Recommendations for Other Services  Rehab consult    Functional Status Assessment Patient has had a recent decline in their functional status and demonstrates the ability to make significant improvements in function in a reasonable and predictable amount of time.     Precautions / Restrictions Precautions Precautions: Fall Precaution Comments: hx falls, 2 weeks ago, wound vac R groin Restrictions Weight Bearing Restrictions: No      Mobility  Bed Mobility Overal bed mobility: Needs Assistance Bed Mobility: Supine to Sit     Supine to sit: HOB elevated, Mod assist, +2 for physical assistance     General bed mobility comments: pt able to bridge hips to EoB and elevated trunk requires mod A for coming into semi long sitting and for scooting hips around to EoB    Transfers Overall transfer level: Needs assistance Equipment used: Rolling walker (2 wheels) Transfers: Sit to/from Stand, Bed to chair/wheelchair/BSC Sit to Stand: From elevated surface, Mod assist, Min assist   Step pivot transfers: Min assist, +2 safety/equipment       General transfer comment: modAx2 for power up, min Ax2 for steadying in RW from elevated bed, vc for hand placement, once up requires min A for stepping to recliner on his L, vc for proximity to recliner to sit and hand placement for lowering down to seat, once seated pt agreeable to attempting to stand from recliner, vc for hand placement and minAx2 for power up and steadying    Ambulation/Gait               General Gait Details: deferred due to dependent pain in R foot        Balance Overall balance assessment: Needs assistance Sitting-balance support: Feet supported,  No upper extremity supported, Bilateral upper extremity supported, Single extremity supported Sitting balance-Leahy Scale: Fair     Standing balance support: During functional activity, Reliant on  assistive device for balance, Bilateral upper extremity supported Standing balance-Leahy Scale: Poor Standing balance comment: able to stand from recliner for ~3 min before pain requires him to sit                             Pertinent Vitals/Pain Pain Assessment Pain Assessment: 0-10 Pain Score: 7  Pain Location: R foot Pain Descriptors / Indicators: Discomfort, Dull, Guarding, Throbbing, Shooting, Sharp Pain Intervention(s): Limited activity within patient's tolerance, Monitored during session, Premedicated before session, Repositioned    Home Living Family/patient expects to be discharged to:: Private residence Living Arrangements: Spouse/significant other Available Help at Discharge: Family;Available 24 hours/day Type of Home: House Home Access: Stairs to enter   Entrance Stairs-Number of Steps: 1 Alternate Level Stairs-Number of Steps: flight Home Layout: Two level;Able to live on main level with bedroom/bathroom Home Equipment: Rolling Walker (2 wheels);Cane - single point;Shower seat;Grab bars - tub/shower;Transport chair;Tub bench;Hand held shower head Additional Comments: does not use second level    Prior Function Prior Level of Function : Independent/Modified Independent;Driving             Mobility Comments: walking with RW ADLs Comments: wife is blind, does IADLs, pt drives     Hand Dominance   Dominant Hand: Left    Extremity/Trunk Assessment   Upper Extremity Assessment Upper Extremity Assessment: Defer to OT evaluation    Lower Extremity Assessment Lower Extremity Assessment: LLE deficits/detail;RLE deficits/detail RLE Deficits / Details: hip and knee strength WFL, increased fear of bumping toes limiting ROM in foot and ankle RLE: Unable to fully assess due to pain RLE Sensation: decreased light touch RLE Coordination: decreased fine motor       Communication   Communication: No difficulties  Cognition Arousal/Alertness:  Awake/alert Behavior During Therapy: WFL for tasks assessed/performed Overall Cognitive Status: Within Functional Limits for tasks assessed                                          General Comments General comments (skin integrity, edema, etc.): pt 98%O2 on 2L O2 on entry, pt reports he does not use supplemental O2 at home and removed [place] able to maintain SpO2 >96% on RA, VSS    Exercises     Assessment/Plan    PT Assessment Patient needs continued PT services  PT Problem List Decreased strength;Decreased range of motion;Decreased activity tolerance;Decreased balance;Decreased mobility;Decreased coordination;Decreased cognition;Cardiopulmonary status limiting activity;Decreased skin integrity;Pain;Impaired sensation       PT Treatment Interventions DME instruction;Gait training;Functional mobility training;Stair training;Therapeutic activities;Therapeutic exercise;Balance training;Cognitive remediation;Patient/family education    PT Goals (Current goals can be found in the Care Plan section)  Acute Rehab PT Goals Patient Stated Goal: surgery Friday PT Goal Formulation: With patient/family Time For Goal Achievement: 11/02/22 Potential to Achieve Goals: Good    Frequency Min 1X/week     Co-evaluation PT/OT/SLP Co-Evaluation/Treatment: Yes Reason for Co-Treatment: For patient/therapist safety   OT goals addressed during session: ADL's and self-care       AM-PAC PT "6 Clicks" Mobility  Outcome Measure Help needed turning from your back to your side while in a flat bed without using bedrails?: A Little Help needed moving from lying  on your back to sitting on the side of a flat bed without using bedrails?: Total Help needed moving to and from a bed to a chair (including a wheelchair)?: Total Help needed standing up from a chair using your arms (e.g., wheelchair or bedside chair)?: Total Help needed to walk in hospital room?: Total Help needed climbing 3-5  steps with a railing? : Total 6 Click Score: 8    End of Session Equipment Utilized During Treatment: Gait belt Activity Tolerance: Patient limited by pain Patient left: in chair;with call bell/phone within reach;with family/visitor present Nurse Communication: Mobility status PT Visit Diagnosis: Unsteadiness on feet (R26.81);Other abnormalities of gait and mobility (R26.89);Muscle weakness (generalized) (M62.81);Difficulty in walking, not elsewhere classified (R26.2);History of falling (Z91.81);Repeated falls (R29.6);Pain Pain - Right/Left: Right Pain - part of body: Ankle and joints of foot    Time: 1354-1452 PT Time Calculation (min) (ACUTE ONLY): 58 min   Charges:   PT Evaluation $PT Eval Moderate Complexity: 1 Mod PT Treatments $Therapeutic Activity: 8-22 mins        Sesilia Poucher B. Beverely Risen PT, DPT Acute Rehabilitation Services Please use secure chat or  Call Office [phone]   Elon Alas [facility] 10/19/2022, 3:58 PM

## 2022-10-19 NOTE — Consult Note (Signed)
NAME:  [name], MRN:  [id number], DOB:  1941/01/18, LOS: 8 ADMISSION DATE:  10/11/2022, CONSULTATION DATE:  10/19/2022 REFERRING MD:  Nelson Chimes - TRH, CHIEF COMPLAINT: Ventilator management  History of Present Illness:  [name] is a 82 y.o. with an extensive past medical history significant for but not limited to HFrEF, AAA, bilateral renal artery stenosis, CKD stage IIIb, COPD, CAD, HTN, HLD, PAF with history of DVT anticoagulated with Coumadin and IVC filter in place, PAD, and, subclavian artery stenosis who initially presented to the emergency department 6/18 for worsening right foot ulcer pain and discoloration.  He was admitted per [facility] with vascular surgery consultation.  Patient underwent abdominal aortogram with lower extremity with Dr. Durwin Nora 6/21 that revealed significant distal disease.   6/25 patient underwent multiple right lower extremity endarterectomies with patch placement per vascular.  Patient remained on pressors postprocedure and was transferred to ICU, PCCM consulted for ventilator management.  Pertinent  Medical History  HFrEF, AAA, bilateral renal artery stenosis, CKD stage IIIb, COPD, CAD, HTN, HLD, PAF with history of DVT anticoagulated with Coumadin and IVC filter in place, PAD, and, subclavian artery stenosis   Significant Hospital Events: Including procedures, antibiotic start and stop dates in addition to other pertinent events   6/18 admitted right foot gangrene 6/19 INR elevated, Coumadin held and vitamin K given per surgery 6/20 Zosyn started, amiodarone, Lopressor, and Lasix on hold 6/21 abdominal aortogram with lower extremity runoff consistent with significant distal disease 6/25 underwent multiple right lower extremity endarterectomies with patch placement per vascular 6/26 remains in the ICU for pressor dependent, PCCM consulted  Interim History / Subjective:  Continues to report moderate right foot pain that is currently tolerable  Objective    Blood pressure (!) 124/41, pulse (!) 48, temperature (!) 96.8 F (36 C), resp. rate 19, height 5\' 9"  (1.753 m), weight 68 kg, SpO2 99 %.    FiO2 (%):  [40 %] 40 %   Intake/Output Summary (Last 24 hours) at 10/19/2022 4098 Last data filed at 10/19/2022 0350 Gross per 24 hour  Intake 1850 ml  Output 1470 ml  Net 380 ml   Filed Weights   10/11/22 1110  Weight: 68 kg    Examination: General: Acute on chronic ill-appearing frail deconditioned male lying in bed in no acute distress HEENT: [place]/AT, MM pink/moist, PERRL,  Neuro: Alert and oriented x 3, nonfocal CV: s1s2 regular rate and rhythm, no murmur, rubs, or gallops,  PULM: Clear to auscultation bilaterally, no increased work of breathing, no added breath sounds GI: soft, bowel sounds active in all 4 quadrants, non-tender, non-distended, tolerating oral diet well Extremities: warm/dry, right/toe gangrene Skin: no rashes or lesion  Resolved Hospital Problem list     Assessment & Plan:  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 Strong history of peripheral arterial disease P: Primary management per vascular Plans for right metatarsal amputation 6/28 Pressors as needed for MAP goal greater than 65 Continue IV heparin drip Adequate pain control Local wound care Continue Zosyn  Abdominal aortic aneurysm -5.5 cm on recent imaging, enlarged per prior image P: Supportive care Vascular surgery following as above  Paroxysmal atrial fibrillation anticoagulated with Coumadin at baseline -Failed DCCV January 2024 P: Continuous telemetry IV heparin drip Continue amiodarone Continue beta-blocker  History of DVT P: Heparin drip as above  HFrEF Aortic valve stenosis -Echocardiogram January 2024 EF 40 to 45% RV systolic function within normal, severe aortic stenosis again seen  Essential  hypertension -Home medications include Lasix P: Continuous telemetry  Heart health diet with sodium restriction when  able  Strict intake and output  Daily weight to assess volume status Daily assessment for need to diurese Closely monitor renal function and electrolytes  Provide supplemtal oxygen as needed  CKD stage IIIb -GFR at baseline 30-59 P: Follow renal function Monitor urine output Trend Bmet Avoid nephrotoxins Ensure adequate renal perfusion   History of COPD P: Continue as needed bronchodilators Encourage pulmonary hygiene Mobilize as able  Hypothyroidism P: Continue Synthroid  Best Practice (right click and "Reselect all SmartList Selections" daily)   Diet/type: Regular consistency (see orders) DVT prophylaxis: systemic heparin GI prophylaxis: PPI Lines: N/A Foley:  Yes, and it is still needed Code Status:  full code Last date of multidisciplinary goals of care discussion: Continue to update patient and family daily   Labs   CBC: Recent Labs  Lab 10/15/22 0757 10/16/22 0303 10/17/22 0308 10/18/22 0152 10/19/22 0547  WBC 11.5* 9.6 10.8* 10.8* 13.6*  HGB 13.5 12.9* 12.0* 12.2* 11.1*  HCT 43.1 41.1 40.2 39.4 37.2*  MCV 86.4 84.2 86.5 85.3 87.1  PLT 375 372 365 359 350    Basic Metabolic Panel: Recent Labs  Lab 10/15/22 0757 10/16/22 0303 10/17/22 0308 10/18/22 0152 10/19/22 0547  NA 141 139 138 140 139  K 3.5 3.2* 3.2* 3.6 3.9  CL 103 104 105 106 106  CO2 28 25 24 25 24   GLUCOSE 106* 94 100* 103* 106*  BUN 18 19 23 23 22   CREATININE 1.49* 1.41* 1.55* 1.66* 1.39*  CALCIUM 8.3* 8.1* 8.0* 8.3* 8.4*  MG 2.4  --   --   --   --    GFR: Estimated Creatinine Clearance: 40.1 mL/min (A) (by C-G formula based on SCr of 1.39 mg/dL (H)). Recent Labs  Lab 10/16/22 0303 10/17/22 0308 10/18/22 0152 10/19/22 0547  WBC 9.6 10.8* 10.8* 13.6*    Liver Function Tests: No results for input(s): "AST", "ALT", "ALKPHOS", "BILITOT", "PROT", "ALBUMIN" in the last 168 hours. No results for input(s): "LIPASE", "AMYLASE" in the last 168 hours. No results for input(s):  "AMMONIA" in the last 168 hours.  ABG    Component Value Date/Time   PHART 7.27 (L) 10/18/2022 1736   PCO2ART 61 (H) 10/18/2022 1736   PO2ART 92 10/18/2022 1736   HCO3 28.0 10/18/2022 1736   TCO2 26 10/11/2022 1205   ACIDBASEDEF 0.3 10/18/2022 1736   O2SAT 97.7 10/18/2022 1736     Coagulation Profile: Recent Labs  Lab 10/13/22 0011 10/14/22 0617  INR 1.8* 1.7*    Cardiac Enzymes: No results for input(s): "CKTOTAL", "CKMB", "CKMBINDEX", "TROPONINI" in the last 168 hours.  HbA1C: Hgb A1c MFr Bld  Date/Time Value Ref Range Status  08/04/2022 09:12 AM 5.7 4.6 - 6.5 % Final    Comment:    Glycemic Control Guidelines for People with Diabetes:Non Diabetic:  <6%Goal of Therapy: <7%Additional Action Suggested:  >8%   07/05/2021 08:49 AM 5.5 4.6 - 6.5 % Final    Comment:    Glycemic Control Guidelines for People with Diabetes:Non Diabetic:  <6%Goal of Therapy: <7%Additional Action Suggested:  >8%     CBG: No results for input(s): "GLUCAP" in the last 168 hours.  Review of Systems:   Please see the history of present illness. All other systems reviewed and are negative   Past Medical History:  He,  has a past medical history of (HFimpEF) heart failure with improved ejection fraction (HCC), AAA (  abdominal aortic aneurysm) (HCC), Aortic calcification (HCC) (05/01/2013), Bilateral renal artery stenosis (HCC), Carotid arterial disease (HCC), CHF (congestive heart failure) (HCC), Chronic kidney disease, COPD (chronic obstructive pulmonary disease) (HCC), Coronary artery disease, History of prior cigarette smoking (04/11/2008), Hyperlipidemia, Hypertension, Hypothyroidism, Intraventricular hemorrhage (HCC) (07/12/2012), Ischemic cardiomyopathy, Moderate aortic stenosis, PAF (paroxysmal atrial fibrillation) (HCC), Peripheral arterial disease (HCC), Right leg DVT (HCC) (08/13/2012), Subclavian artery stenosis, left (HCC) (05/2012), and Venous insufficiency.   Surgical History:   Past  Surgical History:  Procedure Laterality Date   ABDOMINAL ANGIOGRAM  07/10/2013   WITH BI-FEMORAL RUNOFF       DR Kirke Corin   ABDOMINAL AORTAGRAM N/A 12/26/2012   Procedure: ABDOMINAL Ronny Flurry;  Surgeon: [name], MD;  Location: [facility] CATH LAB;  Service: Cardiovascular;  Laterality: N/A;   ABDOMINAL AORTAGRAM N/A 07/10/2013   Procedure: ABDOMINAL Ronny Flurry;  Surgeon: [name], MD;  Location: [facility] CATH LAB;  Service: Cardiovascular;  Laterality: N/A;   ABDOMINAL AORTOGRAM W/LOWER EXTREMITY N/A 05/12/2021   Procedure: ABDOMINAL AORTOGRAM W/LOWER EXTREMITY;  Surgeon: [name], MD;  Location: [facility] INVASIVE CV LAB;  Service: Cardiovascular;  Laterality: N/A;   ABDOMINAL AORTOGRAM W/LOWER EXTREMITY N/A 10/14/2022   Procedure: ABDOMINAL AORTOGRAM W/LOWER EXTREMITY;  Surgeon: Chuck Hint, MD;  Location: [facility] INVASIVE CV LAB;  Service: Cardiovascular;  Laterality: N/A;   ANGIOPLASTY / STENTING FEMORAL  05/2012   ANGIOPLASTY / STENTING ILIAC Bilateral 12/26/2012   APPLICATION OF WOUND VAC Right 10/18/2022   Procedure: APPLICATION OF WOUND VAC;  Surgeon: Chuck Hint, MD;  Location: [facility] OR;  Service: Vascular;  Laterality: Right;   ARCH AORTOGRAM N/A 06/20/2012   Procedure: ARCH AORTOGRAM;  Surgeon: [name], MD;  Location: [facility] CATH [facility];  Service: Cardiovascular;  Laterality: N/A;   CARDIAC CATHETERIZATION  05/2012   [facility]   CARDIOVERSION N/A 05/23/2022   Procedure: CARDIOVERSION;  Surgeon: Dolores Patty, MD;  Location: [facility] ORS;  Service: Cardiovascular;  Laterality: N/A;   CARDIOVERSION N/A 06/14/2022   Procedure: CARDIOVERSION;  Surgeon: Dolores Patty, MD;  Location: [facility] ORS;  Service: Cardiovascular;  Laterality: N/A;   CHOLECYSTECTOMY  1990's   CORONARY ARTERY BYPASS GRAFT  07/11/2011   Procedure: CORONARY ARTERY BYPASS GRAFTING (CABG);  Surgeon: [name], MD;  Location[facility] OR;  Service: Open Heart Surgery;  Laterality: N/A;  Times 3. On Pump.  Using right greater saphenous vein and left internal mammary artery.    CORONARY ARTERY BYPASS GRAFT     ENDARTERECTOMY FEMORAL Right 10/18/2022   Procedure: RIGHT ILIOFEMORAL ENDARTERECTOMY AND RIGHT POPLITEAL ENDARTERECTOMY;  Surgeon: Chuck Hint, MD;  Location: [facility] OR;  Service: Vascular;  Laterality: Right;   FEMORAL-POPLITEAL BYPASS GRAFT Right 10/18/2022   Procedure: RIGHT FEMORAL-BELOW KNEE POPLITEAL ARTERY BYPASS WITH PROPATEN REMOVABLE RING VASCULAR GRAFT;  Surgeon: Chuck Hint, MD;  Location: [facility] OR;  Service: Vascular;  Laterality: Right;   HERNIA REPAIR     INSERTION OF ILIAC STENT Bilateral 12/26/2012   Procedure: INSERTION OF ILIAC STENT;  Surgeon: [name], MD;  Location: [facility] CATH LAB;  Service: Cardiovascular;  Laterality: Bilateral;  Bilateral Common Iliac Artery   LEFT AND RIGHT HEART CATHETERIZATION WITH CORONARY ANGIOGRAM  07/07/2011   Procedure: LEFT AND RIGHT HEART CATHETERIZATION WITH CORONARY ANGIOGRAM;  Surgeon: [name], MD;  Location: [facility] CATH LAB;  Service: Cardiovascular;;   LOWER EXTREMITY VENOGRAPHY Bilateral 12/30/2016   Procedure: Lower Extremity Venography;  Surgeon: Renford Dills, MD;  Location:  [facility] INVASIVE CV LAB;  Service: Cardiovascular;  Laterality: Bilateral;   PATCH ANGIOPLASTY Right 10/18/2022   Procedure: VEIN PATCH ANGIOPLASTY;  Surgeon: Chuck Hint, MD;  Location: [facility] OR;  Service: Vascular;  Laterality: Right;   PERCUTANEOUS STENT INTERVENTION  06/20/2012   Procedure: PERCUTANEOUS STENT INTERVENTION;  Surgeon: [name], MD;  Location: [facility] CATH LAB;  Service: Cardiovascular;;   RENAL ARTERY ANGIOPLASTY Left 07/10/2013   DR ARIDA   RIGHT/LEFT HEART CATH AND CORONARY/GRAFT ANGIOGRAPHY N/A 03/15/2021   Procedure: RIGHT/LEFT HEART CATH AND CORONARY/GRAFT ANGIOGRAPHY;  Surgeon: [name], MD;  Location: [facility] INVASIVE CV LAB;  Service: Cardiovascular;  Laterality: N/A;   SUBCLAVIAN ARTERY  STENT  05/2012   "2 stents" (12/26/2012)   TEE WITHOUT CARDIOVERSION N/A 05/23/2022   Procedure: TRANSESOPHAGEAL ECHOCARDIOGRAM (TEE);  Surgeon: Dolores Patty, MD;  Location: [facility] ORS;  Service: Cardiovascular;  Laterality: N/A;   TOOTH EXTRACTION  Spring 2017   mulitple (6)   VEIN HARVEST Right 10/18/2022   Procedure: VEIN HARVEST OF GREATER SAPHENOUS VEIN;  Surgeon: Chuck Hint, MD;  Location[facility] OR;  Service: Vascular;  Laterality: Right;   VENA CAVA FILTER PLACEMENT  07/2012   Removable     Social History:   reports that he quit smoking about 11 years ago. His smoking use included cigarettes. He has a 60.00 pack-year smoking history. He has never used smokeless tobacco. He reports that he does not drink alcohol and does not use drugs.   Family History:  His family history includes Alcohol abuse in his father; Cirrhosis in his father; Hypothyroidism in his sister.   Allergies Allergies  Allergen Reactions   Plavix [Clopidogrel Bisulfate]     Brain hemorrhage prev while on plavix and aspirin     Home Medications  Prior to Admission medications   Medication Sig Start Date End Date Taking? Authorizing Provider  albuterol (VENTOLIN HFA) 108 (90 Base) MCG/ACT inhaler Inhale 2 puffs into the lungs every 6 (six) hours as needed for wheezing or shortness of breath. 03/25/22  Yes Sunnie Nielsen, DO  amiodarone (PACERONE) 200 MG tablet TAKE 1 TABLET BY MOUTH TWICE A DAY 08/29/22  Yes Bensimhon, Bevelyn Buckles, MD  atorvastatin (LIPITOR) 20 MG tablet TAKE 1 TABLET BY MOUTH EVERY DAY 12/24/21  Yes [name], MD  augmented betamethasone dipropionate (DIPROLENE-AF) 0.05 % cream Apply 1 application  topically 2 (two) times daily as needed (breakouts). 03/14/22  Yes [provider]  empagliflozin (JARDIANCE) 10 MG TABS tablet Take 1 tablet (10 mg total) by mouth daily before breakfast. 06/20/22  Yes Sabharwal, Aditya, DO  furosemide (LASIX) 40 MG tablet Take 1 tablet (40 mg  total) by mouth 2 (two) times daily. 08/15/22  Yes Copland, Karleen Hampshire, MD  levothyroxine (SYNTHROID) 150 MCG tablet TAKE ONE TABLET BY MOUTH EVERY DAY BEFORE BREAKFAST Patient taking differently: Take 150 mcg by mouth daily before breakfast. TAKE ONE TABLET BY MOUTH EVERY DAY BEFORE BREAKFAST 07/08/22  Yes Copland, Karleen Hampshire, MD  warfarin (COUMADIN) 5 MG tablet Take 2.5 mg by mouth See admin instructions. Take 2.5 mg by mouth once daily except Saturday.   Yes [provider]  metoprolol succinate (TOPROL-XL) 25 MG 24 hr tablet TAKE 1 TABLET BY MOUTH TWICE A DAY 10/17/22   Bensimhon, Bevelyn Buckles, MD     Critical care time:    Performed by: Inara Dike D. Harris  Total critical care time: 38 minutes  Critical care time was exclusive of separately billable procedures and  treating other patients.  Critical care was necessary to treat or prevent imminent or life-threatening deterioration.  Critical care was time spent personally by me on the following activities: development of treatment plan with patient and/or surrogate as well as nursing, discussions with consultants, evaluation of patient's response to treatment, examination of patient, obtaining history from patient or surrogate, ordering and performing treatments and interventions, ordering and review of laboratory studies, ordering and review of radiographic studies, pulse oximetry and re-evaluation of patient's condition.   Talia Hoheisel D. Harris, NP-C [facility] Personal contact information can be found on Amion  If no contact or response made please call [phone] 10/19/2022, 8:59 AM

## 2022-10-19 NOTE — Progress Notes (Signed)
  VASCULAR SURGERY ASSESSMENT & PLAN:   POD 1 RIGHT ILIOFEMORAL ENDARTERECTOMY WITH VEIN PATCH/POPLITEAL ENDARTERECTOMY/RIGHT PHAM BELOW-KNEE POP BYPASS (PTFE): His bypass graft is patent.  He has a brisk anterior tibial signal with Doppler.  His posterior tibial artery is occluded distally but he does have a posterior tibial signal now which is new.  GANGRENE RIGHT FIRST THIRD FOURTH TOES: We will allow the foot to demarcate but I would like to proceed with right transmetatarsal amputation on Friday if his cellulitis continues to improve now that he has blood flow.  ANTICOAGULATION: Continue IV heparin for now in anticipation of surgery Friday.  Hold off on oral anticoagulation.  SUBJECTIVE:   No complaints this morning.  He said his foot feels better.  PHYSICAL EXAM:   Vitals:   10/19/22 0600 10/19/22 0615 10/19/22 0630 10/19/22 0645  BP: (!) 128/45  (!) 134/41   Pulse: (!) 49 (!) 48 (!) 49 (!) 48  Resp: (!) 24 18 17 14   Temp:   (!) 96.8 F (36 C)   TempSrc:      SpO2: 99% 100% 99% 98%  Weight:      Height:       His Prevena has a good seal. He has a brisk dorsalis pedis signal with a Doppler and a posterior tibial signal now which is new.  LABS:   Lab Results  Component Value Date   WBC 13.6 (H) 10/19/2022   HGB 11.1 (L) 10/19/2022   HCT 37.2 (L) 10/19/2022   MCV 87.1 10/19/2022   PLT 350 10/19/2022   Lab Results  Component Value Date   CREATININE 1.39 (H) 10/19/2022   Lab Results  Component Value Date   INR 1.7 (H) 10/14/2022    PROBLEM LIST:    Principal Problem:   Critical limb ischemia of right lower extremity with ulceration of foot (HCC) Active Problems:   Hypothyroidism   Abdominal aortic aneurysm (HCC)   COPD (chronic obstructive pulmonary disease) (HCC)   Ischemic cardiomyopathy   Peripheral arterial disease (HCC)   HTN (hypertension)   CKD (chronic kidney disease) stage 3, GFR 30-59 ml/min (HCC)   PAF (paroxysmal atrial fibrillation) (HCC)    DVT (deep venous thrombosis) (HCC)   Aortic valve stenosis   CURRENT MEDS:    amiodarone  200 mg Oral BID   aspirin EC  81 mg Oral Q0600   atorvastatin  20 mg Oral Daily   Chlorhexidine Gluconate Cloth  6 each Topical Daily   levothyroxine  150 mcg Oral Q0600   metoprolol succinate  25 mg Oral BID   pantoprazole  40 mg Oral Daily   potassium chloride  40 mEq Oral Daily    Waverly Ferrari Office: [phone] 10/19/2022

## 2022-10-19 NOTE — Progress Notes (Signed)
Inpatient Rehab Admissions Coordinator:   Per therapy recommendations pt was screened for CIR by Estill Dooms, PT, DPT.  Note plans for TMA on 6/28.  We will follow up for post op assessment.    Estill Dooms, PT, DPT Admissions Coordinator [phone] 10/19/22  4:30 PM

## 2022-10-19 NOTE — Progress Notes (Signed)
ANTICOAGULATION CONSULT NOTE  Pharmacy Consult for heparin Indication: critical limb ischemia, hx afib/DVT  Allergies  Allergen Reactions   Plavix [Clopidogrel Bisulfate]     Brain hemorrhage prev while on plavix and aspirin    Patient Measurements: Height: 5\' 9"  (175.3 cm) Weight: 68 kg (149 lb 14.6 oz) IBW/kg (Calculated) : 70.7 Heparin Dosing Weight: 68 kg  Vital Signs: Temp: 97.1 F (36.2 C) (06/26 1107) Temp Source: Axillary (06/26 1107) BP: 136/40 (06/26 1130) Pulse Rate: 50 (06/26 1145)  Labs: Recent Labs    10/17/22 0308 10/18/22 0152 10/19/22 0547 10/19/22 0943  HGB 12.0* 12.2* 11.1*  --   HCT 40.2 39.4 37.2*  --   PLT 365 359 350  --   HEPARINUNFRC 0.39 0.52  --  0.66  CREATININE 1.55* 1.66* 1.39*  --      Estimated Creatinine Clearance: 40.1 mL/min (A) (by C-G formula based on SCr of 1.39 mg/dL (H)).   Assessment: 36 yom with hx afib/DVT s/p IVC filter on warfarin PTA presenting with critical limb ischemia. He is now s/p endarterectomy/bypass on 6/25 and plans are for transmetatarsal amputation on Friday.  Pharmacy dosing heparin -heparin level at goal on 1700 units/hr -CBC stable   Goal of Therapy:  Heparin level 0.3-0.7 units/ml Monitor platelets by anticoagulation protocol: Yes   Plan:  -Continue heparin at 1700 units/hr -Daily heparin level and CBC  Harland German, PharmD Clinical Pharmacist **Pharmacist phone directory can now be found on [email or web address] ([email or web address]).  Listed under [facility].

## 2022-10-19 NOTE — Anesthesia Postprocedure Evaluation (Signed)
Anesthesia Post Note  Patient: [name]  Procedure(s) Performed: RIGHT ILIOFEMORAL ENDARTERECTOMY AND RIGHT POPLITEAL ENDARTERECTOMY (Right: Leg Upper) RIGHT FEMORAL-BELOW KNEE POPLITEAL ARTERY BYPASS WITH PROPATEN REMOVABLE RING VASCULAR GRAFT (Right: Leg Upper) VEIN PATCH ANGIOPLASTY (Right: Leg Upper) VEIN HARVEST OF GREATER SAPHENOUS VEIN (Right: Leg Lower) APPLICATION OF WOUND VAC (Right: Groin)     Patient location during evaluation: PACU Anesthesia Type: General Level of consciousness: awake and alert Pain management: pain level controlled Vital Signs Assessment: post-procedure vital signs reviewed and stable Respiratory status: spontaneous breathing, nonlabored ventilation, respiratory function stable and patient connected to nasal cannula oxygen Cardiovascular status: blood pressure returned to baseline and stable Postop Assessment: no apparent nausea or vomiting Anesthetic complications: no   No notable events documented.  Last Vitals:  Vitals:   10/19/22 0815 10/19/22 0830  BP:  130/79  Pulse: (!) 48 (!) 51  Resp: 12 16  Temp:    SpO2: 99% 100%    Last Pain:  Vitals:   10/19/22 0800  TempSrc: Oral  PainSc: 0-No pain                 Trevor Iha

## 2022-10-19 NOTE — Evaluation (Signed)
Occupational Therapy Evaluation Patient Details Name: [name] MRN: [id number] DOB: 01-10-1941 Today's Date: 10/19/2022   History of Present Illness 82 yo m referred to ED 6/18 by physician with critical limb ischemia. He is now s/p endarterectomy/bypass on 6/25 and plans are for transmetatarsal amputation on 6/28. 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   Clinical Impression   Pt was ambulating with a RW and driving until 2 week prior to admission. He used a transport chair to get to MD appointments. He lives with his supportive wife who has limited vision. Their children have been assisting with transportation, although pt does still drive. Pt reluctant, initially, to mobilize due to R LE pain, but was able to perform bed mobility, stand and transfer with RW with +2 min assist. Pt rated his R LE pain 7/10 and was able to put some weight through his R LE. Pt requires set up to total assist for ADLs. Plan is for amputation of pt's R toes on 6/28, weight bearing status likely to change. Patient will benefit from intensive inpatient follow up therapy, >3 hours/day      Recommendations for follow up therapy are one component of a multi-disciplinary discharge planning process, led by the attending physician.  Recommendations may be updated based on patient status, additional functional criteria and insurance authorization.   Assistance Recommended at Discharge Frequent or constant Supervision/Assistance  Patient can return home with the following Two people to help with walking and/or transfers;A lot of help with bathing/dressing/bathroom;Assistance with cooking/housework;Assist for transportation;Help with stairs or ramp  for entrance    Functional Status Assessment  Patient has had a recent decline in their functional status and demonstrates the ability to make significant improvements in function in a reasonable and predictable amount of time.  Equipment Recommendations  None recommended by OT    Recommendations for Other Services Rehab consult     Precautions / Restrictions Precautions Precautions: Fall Precaution Comments: hx falls, 2 weeks ago, wound vac R groin Restrictions Weight Bearing Restrictions: No      Mobility Bed Mobility Overal bed mobility: Needs Assistance Bed Mobility: Supine to Sit     Supine to sit: +2 for physical assistance, Min assist     General bed mobility comments: increased time, assist to raise trunk and for hips to EOB    Transfers Overall transfer level: Needs assistance Equipment used: Rolling walker (2 wheels) Transfers: Sit to/from Stand, Bed to chair/wheelchair/BSC Sit to Stand: +2 physical assistance, Min assist     Step pivot transfers: +2 physical assistance, Min assist     General transfer comment: cues for hand placement, assist to rise and steady, pt taking approximately 25% of weight through R LE      Balance Overall balance assessment: Needs assistance   Sitting balance-Leahy Scale: Fair     Standing balance support: Bilateral upper extremity supported Standing balance-Leahy Scale: Poor Standing balance comment: reliant on RW for static standing and transfers                           ADL either performed or assessed with clinical judgement   ADL Overall ADL's : Needs assistance/impaired Eating/Feeding: Independent;Sitting   Grooming: Set up;Sitting  Upper Body Bathing: Set up;Sitting   Lower Body Bathing: Maximal assistance;Sit to/from stand   Upper Body Dressing : Set up;Sitting   Lower Body Dressing: Total assistance;Bed level   Toilet Transfer: Minimal assistance;Stand-pivot;Rolling walker (2 wheels);+2  for physical assistance Toilet Transfer Details (indicate cue type and reason): simulated to chair                 Vision Ability to See in Adequate Light: 0 Adequate Patient Visual Report: No change from baseline       Perception     Praxis      Pertinent Vitals/Pain Pain Assessment Pain Assessment: 0-10 Pain Score: 7  Pain Location: R foot Pain Descriptors / Indicators: Discomfort, Dull, Guarding, Throbbing, Shooting, Sharp     Hand Dominance Left   Extremity/Trunk Assessment Upper Extremity Assessment Upper Extremity Assessment: Defer to OT evaluation   Lower Extremity Assessment Lower Extremity Assessment: LLE deficits/detail;RLE deficits/detail RLE Deficits / Details: hip and knee strength WFL, increased fear of bumping toes limiting ROM in foot and ankle RLE: Unable to fully assess due to pain RLE Sensation: decreased light touch RLE Coordination: decreased fine motor   Cervical / Trunk Assessment Cervical / Trunk Assessment: Normal   Communication Communication Communication: No difficulties   Cognition Arousal/Alertness: Awake/alert Behavior During Therapy: WFL for tasks assessed/performed Overall Cognitive Status: Within Functional Limits for tasks assessed                                       General Comments  pt 98%O2 on 2L O2 on entry, pt reports he does not use supplemental O2 at home and removed [place] able to maintain SpO2 >96% on RA, VSS    Exercises     Shoulder Instructions      Home Living Family/patient expects to be discharged to:: Private residence Living Arrangements: Spouse/significant other Available Help at Discharge: Family;Available 24 hours/day Type of Home: House Home Access: Stairs to enter [employer] of Steps: 1   Home Layout: Two level;Able to live on main level with bedroom/bathroom Alternate Level Stairs-Number of Steps: flight   Bathroom Shower/Tub: Chief Strategy Officer:  Standard Bathroom Accessibility: No   Home Equipment: Agricultural consultant (2 wheels);Cane - single point;Shower seat;Grab bars - tub/shower;Transport chair;Tub bench;Hand held shower head   Additional Comments: does not use second level      Prior Functioning/Environment Prior Level of Function : Independent/Modified Independent;Driving             Mobility Comments: walking with RW ADLs Comments: wife is blind, does IADLs, pt drives        OT Problem List: Decreased strength;Decreased activity tolerance;Impaired balance (sitting and/or standing);Pain;Decreased knowledge of use of DME or AE      OT Treatment/Interventions: Self-care/ADL training;DME and/or AE instruction;Therapeutic activities;Patient/family education;Balance training    OT Goals(Current goals can be found in the care plan section) Acute Rehab OT Goals OT Goal Formulation: With patient Time For Goal Achievement: 11/02/22 Potential to Achieve Goals: Good ADL Goals Pt Will Perform Grooming: with supervision;standing Pt Will Perform Lower Body Bathing: with supervision;sit to/from stand Pt Will Perform Lower Body Dressing: with supervision;sit to/from stand Pt Will Transfer to Toilet: with supervision;ambulating;regular height toilet Pt Will Perform Toileting - Clothing Manipulation and hygiene: with supervision;sit to/from stand Additional ADL Goal #1: Pt will perform bed mobility modified independently in preparation for ADLs.  OT Frequency:  Min 1X/week    Co-evaluation PT/OT/SLP Co-Evaluation/Treatment: Yes Reason for Co-Treatment: For patient/therapist safety   OT goals addressed during session: ADL's and self-care      AM-PAC OT "6 Clicks" Daily Activity     Outcome Measure Help from another person eating meals?: None Help from another person taking care of personal grooming?: A Little Help from another person toileting, which includes using toliet, bedpan, or urinal?: A Lot Help from another person  bathing (including washing, rinsing, drying)?: A Lot Help from another person to put on and taking off regular upper body clothing?: A Little Help from another person to put on and taking off regular lower body clothing?: Total 6 Click Score: 15   End of Session Equipment Utilized During Treatment: Gait belt;Rolling walker (2 wheels) Nurse Communication: Mobility status;Other (comment) (VSS on RA, left O2 off)  Activity Tolerance: Patient tolerated treatment well Patient left: in chair;with call bell/phone within reach;with family/visitor present  OT Visit Diagnosis: Unsteadiness on feet (R26.81);Other abnormalities of gait and mobility (R26.89);Pain;Muscle weakness (generalized) (M62.81);History of falling (Z91.81) Pain - Right/Left: Right Pain - part of body: Leg                Time: 1354-1445 OT Time Calculation (min): 51 min Charges:  OT General Charges $OT Visit: 1 Visit OT Evaluation $OT Eval Moderate Complexity: 1 Mod  Berna Spare, OTR/L Acute Rehabilitation Services Office: [phone]   Evern Bio 10/19/2022, 4:02 PM

## 2022-10-20 ENCOUNTER — Encounter (HOSPITAL_COMMUNITY): Payer: Self-pay | Admitting: Certified Registered Nurse Anesthetist

## 2022-10-20 DIAGNOSIS — I70235 Atherosclerosis of native arteries of right leg with ulceration of other part of foot: Secondary | ICD-10-CM | POA: Diagnosis not present

## 2022-10-20 DIAGNOSIS — N1831 Chronic kidney disease, stage 3a: Secondary | ICD-10-CM

## 2022-10-20 LAB — CBC
HCT: 37.2 % — ABNORMAL LOW (ref 39.0–52.0)
Hemoglobin: 11 g/dL — ABNORMAL LOW (ref 13.0–17.0)
MCH: 26.3 pg (ref 26.0–34.0)
MCHC: 29.6 g/dL — ABNORMAL LOW (ref 30.0–36.0)
MCV: 89 fL (ref 80.0–100.0)
Platelets: 383 10*3/uL (ref 150–400)
RBC: 4.18 MIL/uL — ABNORMAL LOW (ref 4.22–5.81)
RDW: 18.6 % — ABNORMAL HIGH (ref 11.5–15.5)
WBC: 15.3 10*3/uL — ABNORMAL HIGH (ref 4.0–10.5)
nRBC: 0 % (ref 0.0–0.2)

## 2022-10-20 LAB — BASIC METABOLIC PANEL
Anion gap: 7 (ref 5–15)
BUN: 29 mg/dL — ABNORMAL HIGH (ref 8–23)
CO2: 24 mmol/L (ref 22–32)
Calcium: 8.2 mg/dL — ABNORMAL LOW (ref 8.9–10.3)
Chloride: 107 mmol/L (ref 98–111)
Creatinine, Ser: 1.6 mg/dL — ABNORMAL HIGH (ref 0.61–1.24)
GFR, Estimated: 43 mL/min — ABNORMAL LOW (ref >60)
Glucose, Bld: 88 mg/dL (ref 70–99)
Potassium: 4 mmol/L (ref 3.5–5.1)
Sodium: 138 mmol/L (ref 135–145)

## 2022-10-20 MED ORDER — METOPROLOL SUCCINATE ER 25 MG PO TB24
12.5000 mg | ORAL_TABLET | Freq: Two times a day (BID) | ORAL | Status: DC
  Filled 2022-10-20 (×2): qty 1

## 2022-10-20 NOTE — Progress Notes (Signed)
Physical Therapy Treatment Patient Details Name: [name] MRN: [id number] DOB: 1941/04/02 Today's Date: 10/20/2022   History of Present Illness 82 yo m referred to ED 6/18 by physician with critical limb ischemia. He is now s/p endarterectomy/bypass on 6/25 and plans are for transmetatarsal amputation on 6/28. 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    PT Comments    Pt in bed on entry, slightly confused about who his nurses are and his schedule of pain medication. Once pain medication administered pt agreeable to therapy. Initially worked on R LE ROM educating on need for neutral hip alignment as well as knee extension and frequent ankle ROM. Provided AAROM of R LE prior to mobilization. Pt with difficulty sequencing and with command follow for transfer. Preoccupied with difficulty with in ability to extend R knee and weightbearing through RLE. PT provided instruction to utilize UE and LLE for stability with pivot transfer to recliner. Pt ultimately requiring maxA for safely landing in recliner on his L when he attempted to weightbear through his R LE and his knee buckled. PT will follow back after surgery.   Recommendations for follow up therapy are one component of a multi-disciplinary discharge planning process, led by the attending physician.  Recommendations may be updated based on patient status, additional functional criteria and insurance authorization.     Assistance Recommended at Discharge Frequent or constant Supervision/Assistance  Patient can return home with the following A lot of help with walking and/or transfers;A lot of help with bathing/dressing/bathroom;Assistance with cooking/housework;Direct  supervision/assist for medications management;Direct supervision/assist for financial management;Assist for transportation;Help with stairs or ramp for entrance   Equipment Recommendations  None recommended by PT    Recommendations for Other Services Rehab consult     Precautions / Restrictions Precautions Precautions: Fall Precaution Comments: hx falls, 2 weeks ago, wound vac R groin Restrictions Weight Bearing Restrictions: No     Mobility  Bed Mobility Overal bed mobility: Needs Assistance Bed Mobility: Supine to Sit     Supine to sit: HOB elevated, Mod assist, +2 for physical assistance     General bed mobility comments: pt able to manage LE to EOB with cuing requires min A for bringing trunk to upright and modA for scooting hips to EoB    Transfers Overall transfer level: Needs assistance Equipment used: Rolling walker (2 wheels) Transfers: Sit to/from Stand, Bed to chair/wheelchair/BSC Sit to Stand: From elevated surface, Mod assist   Step pivot transfers: Mod assist, Max assist       General transfer comment: modA for power up to RW, pt with difficulty with R knee buckling with coming to standing requires 3x attempt. maxA for pivot to chair due to R knee buckling, difficulty with following command for sequencing and not trying to stand on R LE    Ambulation/Gait               General Gait Details: deferred due to dependent pain in R foot         Balance Overall balance assessment: Needs assistance Sitting-balance support: Feet supported, No upper extremity supported, Bilateral upper extremity supported, Single extremity supported Sitting balance-Leahy Scale: Fair     Standing balance support: During functional activity, Reliant on assistive device for balance, Bilateral  upper extremity supported Standing balance-Leahy Scale: Poor Standing balance comment: able to stand from recliner for ~3 min before pain requires him to sit                             Cognition Arousal/Alertness: Awake/alert Behavior During Therapy: WFL for tasks assessed/performed Overall Cognitive Status: Within Functional Limits for tasks assessed                                             General Comments General comments (skin integrity, edema, etc.): wife present, able to maintain SpO2 > 90% on RA with mobility today, BP 96/46 prior to mobilization      Pertinent Vitals/Pain Pain Assessment Pain Assessment: 0-10 Pain Score: 9  Pain Location: R foot Pain Descriptors / Indicators: Discomfort, Dull, Guarding, Throbbing, Shooting, Sharp Pain Intervention(s): Limited activity within patient's tolerance, Monitored during session, Repositioned, Premedicated before session     PT Goals (current goals can now be found in the care plan section) Acute Rehab PT Goals Patient Stated Goal: surgery Friday PT Goal Formulation: With patient/family Time For Goal Achievement: 11/02/22 Potential to Achieve Goals: Good Progress towards PT goals: Progressing toward goals    Frequency    Min 1X/week      PT Plan  Current plan remains appropriate.        AM-PAC PT "6 Clicks" Mobility   Outcome Measure  Help needed turning from your back to your side while in a flat bed without using bedrails?: A Little Help needed moving from lying on your back to sitting on the side of a flat bed without using bedrails?: Total Help needed moving to and from a bed to a chair (including a wheelchair)?: Total Help needed standing up from a chair using your arms (e.g., wheelchair or bedside chair)?: Total Help needed to walk in hospital room?: Total Help needed climbing 3-5 steps with a railing? : Total 6 Click Score: 8    End of Session Equipment Utilized During Treatment: Gait belt Activity Tolerance: Patient limited by pain Patient left: in chair;with call bell/phone within reach;with family/visitor present Nurse Communication: Mobility  status PT Visit Diagnosis: Unsteadiness on feet (R26.81);Other abnormalities of gait and mobility (R26.89);Muscle weakness (generalized) (M62.81);Difficulty in walking, not elsewhere classified (R26.2);History of falling (Z91.81);Repeated falls (R29.6);Pain Pain - Right/Left: Right Pain - part of body: Ankle and joints of foot     Time: 9811-9147 PT Time Calculation (min) (ACUTE ONLY): 49 min  Charges:  $Therapeutic Activity: 38-52 mins                     Wiliam Cauthorn B. Beverely Risen PT, DPT Acute Rehabilitation Services Please use secure chat or  Call Office [phone]    Elon Alas [facility] 10/20/2022, 3:57 PM

## 2022-10-20 NOTE — Progress Notes (Signed)
ANTICOAGULATION CONSULT NOTE  Pharmacy Consult for heparin Indication: critical limb ischemia, hx afib/DVT  Allergies  Allergen Reactions   Plavix [Clopidogrel Bisulfate]     Brain hemorrhage prev while on plavix and aspirin    Patient Measurements: Height: 5\' 9"  (175.3 cm) Weight: 68 kg (149 lb 14.6 oz) IBW/kg (Calculated) : 70.7 Heparin Dosing Weight: 68 kg  Vital Signs: Temp: 97.7 F (36.5 C) (06/27 0756) Temp Source: Oral (06/27 0756) BP: 99/44 (06/27 1030) Pulse Rate: 54 (06/27 1030)  Labs: Recent Labs    10/18/22 0152 10/19/22 0547 10/19/22 0943 10/20/22 0407  HGB 12.2* 11.1*  --  11.0*  HCT 39.4 37.2*  --  37.2*  PLT 359 350  --  383  HEPARINUNFRC 0.52  --  0.66  --   CREATININE 1.66* 1.39*  --  1.60*     Estimated Creatinine Clearance: 34.8 mL/min (A) (by C-G formula based on SCr of 1.6 mg/dL (H)).   Assessment: 23 yom with hx afib/DVT s/p IVC filter on warfarin PTA presenting with critical limb ischemia. He is now s/p endarterectomy/bypass on 6/25 and plans are for transmetatarsal amputation on Friday.  Pharmacy dosing heparin -No heparin level today but it has been at goal on 1700 units/hr since 6/23    Goal of Therapy:  Heparin level 0.3-0.7 units/ml Monitor platelets by anticoagulation protocol: Yes   Plan:  -Continue heparin at 1700 units/hr -Daily heparin level and CBC  Harland German, PharmD Clinical Pharmacist **Pharmacist phone directory can now be found on [email or web address] ([email or web address]).  Listed under [facility].

## 2022-10-20 NOTE — Progress Notes (Addendum)
Progress Note   VASCULAR SURGERY ASSESSMENT & PLAN:   POD 2 RIGHT ILIOFEMORAL ENDARTERECTOMY AND RIGHT FEMORAL POPLITEAL BYPASS: He has brisk Doppler signals in the right foot.  The toes are demarcating.  However he is currently on neo and it be nice to try to get him off this as this can certainly compromise perfusion distally.  His pressure has been fairly low however and he is currently requiring neo-.  I will reevaluate him in the morning but we tentatively plan a right transmetatarsal amputation tomorrow.  Continue IV heparin.  Cari Caraway, MD 8:30 AM   10/20/2022 8:22 AM 2 Days Post-Op  Subjective:  R foot warm but still painful   Vitals:   10/20/22 0630 10/20/22 0756  BP: (!) 105/38   Pulse: (!) 50   Resp: 20   Temp:  97.7 F (36.5 C)  SpO2: 100%    Physical Exam: Lungs:  non labored Incisions:  R groin incisional vac with good seal;  popliteal incision c/d/i Extremities:  dry gangrene toes; brisk DP and PT using fetal doppler Neurologic: A&O  CBC    Component Value Date/Time   WBC 15.3 (H) 10/20/2022 0407   RBC 4.18 (L) 10/20/2022 0407   HGB 11.0 (L) 10/20/2022 0407   HGB 14.4 05/06/2021 0953   HCT 37.2 (L) 10/20/2022 0407   HCT 42.6 05/06/2021 0953   PLT 383 10/20/2022 0407   PLT 199 05/06/2021 0953   MCV 89.0 10/20/2022 0407   MCV 87 05/06/2021 0953   MCV 87 06/03/2013 0454   MCH 26.3 10/20/2022 0407   MCHC 29.6 (L) 10/20/2022 0407   RDW 18.6 (H) 10/20/2022 0407   RDW 13.6 05/06/2021 0953   RDW 14.2 06/03/2013 0454   LYMPHSABS 1.1 10/11/2022 1056   LYMPHSABS 1.8 05/06/2021 0953   LYMPHSABS 2.7 07/28/2012 0513   MONOABS 0.9 10/11/2022 1056   MONOABS 1.4 (H) 07/28/2012 0513   EOSABS 0.2 10/11/2022 1056   EOSABS 0.3 05/06/2021 0953   EOSABS 0.3 07/28/2012 0513   BASOSABS 0.1 10/11/2022 1056   BASOSABS 0.1 05/06/2021 0953   BASOSABS 1 06/03/2013 0454   BASOSABS 0.1 07/28/2012 0513    BMET    Component Value Date/Time   NA 138 10/20/2022  0407   NA 142 05/06/2021 0953   NA 136 06/03/2013 0454   K 4.0 10/20/2022 0407   K 4.1 06/03/2013 0454   CL 107 10/20/2022 0407   CL 106 06/03/2013 0454   CO2 24 10/20/2022 0407   CO2 27 06/03/2013 0454   GLUCOSE 88 10/20/2022 0407   GLUCOSE 80 06/03/2013 0454   BUN 29 (H) 10/20/2022 0407   BUN 22 05/06/2021 0953   BUN 22 (H) 06/03/2013 0454   CREATININE 1.60 (H) 10/20/2022 0407   CREATININE 1.59 (H) 06/03/2013 0454   CREATININE 1.02 04/22/2011 1507   CALCIUM 8.2 (L) 10/20/2022 0407   CALCIUM 8.8 06/03/2013 0454   GFRNONAA 43 (L) 10/20/2022 0407   GFRNONAA 43 (L) 06/03/2013 0454   GFRAA 59 (L) 12/30/2016 0103   GFRAA 50 (L) 06/03/2013 0454    INR    Component Value Date/Time   INR 1.7 (H) 10/14/2022 0617   INR 1.3 06/02/2013 0746     Intake/Output Summary (Last 24 hours) at 10/20/2022 4782 Last data filed at 10/20/2022 0600 Gross per 24 hour  Intake 1559.14 ml  Output 850 ml  Net 709.14 ml     Assessment/Plan:  82 y.o. male is s/p RIGHT ILIOFEMORAL ENDARTERECTOMY WITH  VEIN PATCH/POPLITEAL ENDARTERECTOMY/RIGHT FEM BELOW-KNEE POP BYPASS with PTFE 2 Days Post-Op   R foot warm and well perfused; brisk DP and PT by doppler R groin incisional vac with good seal; continue for 7 days post op Gangrenous toes; tentative plan is for R TMA tomorrow with Dr. [name]; patient is agreeable to proceed Npo past midnight Consent ordered Continue IV heparin   Emilie Rutter, PA-C [facility] [phone] 10/20/2022 8:22 AM

## 2022-10-20 NOTE — Progress Notes (Signed)
NAME:  [name], MRN:  [id number], DOB:  10-May-1940, LOS: 9 ADMISSION DATE:  10/11/2022, CONSULTATION DATE:  10/19/2022 REFERRING MD:  Nelson Chimes - TRH, CHIEF COMPLAINT: Ventilator management  History of Present Illness:  [name] is a 82 y.o. with an extensive past medical history significant for but not limited to HFrEF, AAA, bilateral renal artery stenosis, CKD stage IIIb, COPD, CAD, HTN, HLD, PAF with history of DVT anticoagulated with Coumadin and IVC filter in place, PAD, and, subclavian artery stenosis who initially presented to the emergency department 6/18 for worsening right foot ulcer pain and discoloration.  He was admitted per [facility] with vascular surgery consultation.  Patient underwent abdominal aortogram with lower extremity with Dr. Durwin Nora 6/21 that revealed significant distal disease.   6/25 patient underwent multiple right lower extremity endarterectomies with patch placement per vascular.  Patient remained on pressors postprocedure and was transferred to ICU, PCCM consulted for ventilator management.  Pertinent  Medical History  HFrEF, AAA, bilateral renal artery stenosis, CKD stage IIIb, COPD, CAD, HTN, HLD, PAF with history of DVT anticoagulated with Coumadin and IVC filter in place, PAD, and, subclavian artery stenosis   Significant Hospital Events: Including procedures, antibiotic start and stop dates in addition to other pertinent events   6/18 admitted right foot gangrene 6/19 INR elevated, Coumadin held and vitamin K given per surgery 6/20 Zosyn started, amiodarone, Lopressor, and Lasix on hold 6/21 abdominal aortogram with lower extremity runoff consistent with significant distal disease 6/25 underwent multiple right lower extremity endarterectomies with patch placement per vascular 6/26 remains in the ICU for pressor dependent, PCCM consulted 6/27 no acute issues overnight, remains on low-dose vasopressors peripherally  Interim History / Subjective:  States he  feels well this a.m. with no acute complaints  Objective   Blood pressure (!) 105/38, pulse (!) 50, temperature 97.7 F (36.5 C), temperature source Oral, resp. rate 20, height 5\' 9"  (1.753 m), weight 68 kg, SpO2 100 %.        Intake/Output Summary (Last 24 hours) at 10/20/2022 0708 Last data filed at 10/20/2022 0600 Gross per 24 hour  Intake 2645.17 ml  Output 900 ml  Net 1745.17 ml    Filed Weights   10/11/22 1110  Weight: 68 kg    Examination: General: Acute on chronic ill-appearing frail deconditioned elderly male lying in bed in no acute distress HEENT: [place]/AT, MM pink/moist, PERRL,  Neuro: And oriented x 3, nonfocal CV: Bradycardia s1s2 regular rate and rhythm, no murmur, rubs, or gallops,  PULM: Clear to auscultation bilaterally, no increased work of breathing, no added breath sounds GI: soft, bowel sounds active in all 4 quadrants, non-tender, non-distended, tolerating oral diet Extremities: warm/dry, no edema  Skin: no rashes or lesions  Resolved Hospital Problem list     Assessment & Plan:  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 Strong history of peripheral arterial disease P: Primary management per vascular  Tentative plans for metatarsal amputation 6/28 Pressors as needed for MAP goal greater than 65 Continue IV heparin  Local wound care  Continue empiric Zosyn  Ensure pain control  Abdominal aortic aneurysm -5.5 cm on recent imaging, enlarged per prior image P: Vascular following  Supportive care   Paroxysmal atrial fibrillation anticoagulated with Coumadin at baseline -Failed DCCV January 2024 P: Continuous telemetry  IV heaprin drip  PO Amiodarone Decrease beta blocker   History of DVT P: Heparin drip as above   HFrEF Aortic valve stenosis -Echocardiogram January 2024  EF 40 to 45% RV systolic function within normal, severe aortic stenosis again seen  Essential hypertension -Home medications include  Lasix P: Continuous telemetry  Heart healthy diet  Strict intake and output  Daily weight  Daily assessment for need to diureses  Optimize electrolytes   CKD stage IIIb -GFR at baseline 30-59 P: Follow renal function  Monitor urine output  Trend Bmet  Avoid nephrotoxins  Ensure adequate renal perfusion   History of COPD P: As needed bronchodilator Encourage pulmonary hygiene  Mobilize  as able     Hypothyroidism P: Continue synthroid   Critical care time:    Performed by: Keiona Jenison D. Harris  Total critical care time: 37 minutes  Critical care time was exclusive of separately billable procedures and treating other patients.  Critical care was necessary to treat or prevent imminent or life-threatening deterioration.  Critical care was time spent personally by me on the following activities: development of treatment plan with patient and/or surrogate as well as nursing, discussions with consultants, evaluation of patient's response to treatment, examination of patient, obtaining history from patient or surrogate, ordering and performing treatments and interventions, ordering and review of laboratory studies, ordering and review of radiographic studies, pulse oximetry and re-evaluation of patient's condition.  Autumn Pruitt D. Harris, NP-C [facility] Personal contact information can be found on Amion  If no contact or response made please call [phone] 10/20/2022, 7:08 AM

## 2022-10-21 ENCOUNTER — Other Ambulatory Visit: Payer: Self-pay | Admitting: Cardiovascular Disease

## 2022-10-21 ENCOUNTER — Encounter (HOSPITAL_COMMUNITY)
Admission: EM | Disposition: A | Discharge status: Skilled Nursing Facility | Payer: Self-pay | Source: Ambulatory Visit | Attending: Internal Medicine

## 2022-10-21 ENCOUNTER — Inpatient Hospital Stay (HOSPITAL_COMMUNITY): Payer: Medicare Other

## 2022-10-21 DIAGNOSIS — I70235 Atherosclerosis of native arteries of right leg with ulceration of other part of foot: Secondary | ICD-10-CM | POA: Diagnosis not present

## 2022-10-21 DIAGNOSIS — I9589 Other hypotension: Secondary | ICD-10-CM | POA: Diagnosis not present

## 2022-10-21 DIAGNOSIS — R579 Shock, unspecified: Secondary | ICD-10-CM | POA: Diagnosis not present

## 2022-10-21 LAB — CBC
HCT: 34 % — ABNORMAL LOW (ref 39.0–52.0)
Hemoglobin: 10.3 g/dL — ABNORMAL LOW (ref 13.0–17.0)
MCH: 26.5 pg (ref 26.0–34.0)
MCHC: 30.3 g/dL (ref 30.0–36.0)
MCV: 87.6 fL (ref 80.0–100.0)
Platelets: 300 10*3/uL (ref 150–400)
RBC: 3.88 MIL/uL — ABNORMAL LOW (ref 4.22–5.81)
RDW: 18.7 % — ABNORMAL HIGH (ref 11.5–15.5)
WBC: 14.2 10*3/uL — ABNORMAL HIGH (ref 4.0–10.5)
nRBC: 0 % (ref 0.0–0.2)

## 2022-10-21 LAB — HEPARIN LEVEL (UNFRACTIONATED): Heparin Unfractionated: 0.85 IU/mL — ABNORMAL HIGH (ref 0.30–0.70)

## 2022-10-21 LAB — BASIC METABOLIC PANEL
Anion gap: 12 (ref 5–15)
BUN: 26 mg/dL — ABNORMAL HIGH (ref 8–23)
CO2: 21 mmol/L — ABNORMAL LOW (ref 22–32)
Calcium: 8 mg/dL — ABNORMAL LOW (ref 8.9–10.3)
Chloride: 104 mmol/L (ref 98–111)
Creatinine, Ser: 1.54 mg/dL — ABNORMAL HIGH (ref 0.61–1.24)
GFR, Estimated: 45 mL/min — ABNORMAL LOW (ref >60)
Glucose, Bld: 108 mg/dL — ABNORMAL HIGH (ref 70–99)
Potassium: 4.3 mmol/L (ref 3.5–5.1)
Sodium: 137 mmol/L (ref 135–145)

## 2022-10-21 LAB — ECHOCARDIOGRAM COMPLETE
AR max vel: 0.78 cm2
AV Area VTI: 0.83 cm2
AV Area mean vel: 0.8 cm2
AV Mean grad: 29.5 mmHg
AV Peak grad: 44.6 mmHg
Ao pk vel: 3.34 m/s
Area-P 1/2: 4.39 cm2
Height: 69 in
S' Lateral: 3.4 cm
Weight: 2451.52 oz

## 2022-10-21 LAB — MRSA NEXT GEN BY PCR, NASAL: MRSA by PCR Next Gen: NOT DETECTED

## 2022-10-21 SURGERY — AMPUTATION, FOOT, TRANSMETATARSAL
Anesthesia: Choice | Site: Toe | Laterality: Right

## 2022-10-21 MED ORDER — SENNOSIDES-DOCUSATE SODIUM 8.6-50 MG PO TABS
1.0000 | ORAL_TABLET | Freq: Two times a day (BID) | ORAL | Status: DC
  Administered 2022-10-22 – 2022-10-24 (×3): 1 via ORAL
  Filled 2022-10-21 (×7): qty 1

## 2022-10-21 MED ORDER — NOREPINEPHRINE 4 MG/250ML-% IV SOLN
2.0000 ug/min | INTRAVENOUS | Status: DC
  Administered 2022-10-21: 2 ug/min via INTRAVENOUS
  Filled 2022-10-21: qty 250

## 2022-10-21 MED ORDER — BISACODYL 10 MG RE SUPP: 10.0000 mg | Freq: Every day | RECTAL | Status: DC | PRN

## 2022-10-21 MED ORDER — POLYETHYLENE GLYCOL 3350 17 G PO PACK
17.0000 g | PACK | Freq: Every day | ORAL | Status: DC
  Administered 2022-10-21 – 2022-10-22 (×2): 17 g via ORAL
  Filled 2022-10-21 (×4): qty 1

## 2022-10-21 MED ORDER — LACTATED RINGERS IV BOLUS
250.0000 mL | Freq: Once | INTRAVENOUS | Status: AC
  Administered 2022-10-21: 250 mL via INTRAVENOUS

## 2022-10-21 MED ORDER — MIDODRINE HCL 5 MG PO TABS
10.0000 mg | ORAL_TABLET | Freq: Three times a day (TID) | ORAL | Status: DC
  Administered 2022-10-21 – 2022-10-22 (×2): 10 mg via ORAL
  Filled 2022-10-21 (×3): qty 2

## 2022-10-21 MED ORDER — SODIUM CHLORIDE 0.9 % IV SOLN
250.0000 mL | INTRAVENOUS | Status: DC
  Administered 2022-10-21: 250 mL via INTRAVENOUS

## 2022-10-21 NOTE — Progress Notes (Signed)
Inpatient Rehab Admissions Coordinator:   Discussed with TOC and VVS.  Pt still on pressors.  Tentative plan to proceed with TMA next week.  Based on therapy progress, would not expect pt to be able to d/c home regardless of surgery.  I will go ahead and place a CIR consult order so we can do a full assessment of candidacy.  An AC will f/u with pt/spouse tomorrow.     Estill Dooms, PT, DPT Admissions Coordinator [phone] 10/21/22  3:44 PM

## 2022-10-21 NOTE — TOC Progression Note (Signed)
Transition of Care [facility]) - Progression Note    Patient Details  Name: [name] MRN: [id number] Date of Birth: Aug 03, 1940  Transition of Care [facility]) CM/SW Contact  Graves-Bigelow, Lamar Laundry, RN Phone Number: 10/21/2022, 4:08 PM  Clinical Narrative: Staff RN asked Case Manager to speak with family regarding disposition plan. Secure chat was formulated to the team to discuss the plan. Inpatient Rehab Admissions Coordinator is following the patient for potential candidate for CIR. Per Coordinator, an order will be placed for the Inpatient Rehab Coordinator to speak with patient and spouse regarding an overall explanation of rehab expectations and goals. Spouse feels that the patient will need a higher level of care rather than returning straight home and we discussed CIR vs SNF. Patient states he will be agreeable to either. Case Manager will continue to follow for transition of care needs as the patient progresses.   Expected Discharge Plan: IP Rehab Facility Barriers to Discharge: Continued Medical Work up  Expected Discharge Plan and Services In-house Referral: NA Discharge Planning Services: CM Consult Post Acute Care Choice: Home Health Living arrangements for the past 2 months: Single Family Home    HH Agency: [facility]  Social Determinants of Health (SDOH) Interventions SDOH Screenings   Food Insecurity: No Food Insecurity (10/14/2022)  Housing: Low Risk  (10/14/2022)  Transportation Needs: No Transportation Needs (10/14/2022)  Utilities: Not At Risk (10/14/2022)  Alcohol Screen: Low Risk  (07/13/2022)  Depression (PHQ2-9): Low Risk  (07/13/2022)  Financial Resource Strain: Low Risk  (07/13/2022)  Physical Activity: Insufficiently Active (07/13/2022)  Social Connections: Moderately Integrated (07/13/2022)  Stress: No Stress Concern Present (07/13/2022)  Tobacco Use: Medium Risk (10/19/2022)    Readmission Risk Interventions     No data to display

## 2022-10-21 NOTE — Progress Notes (Signed)
NAME:  [name], MRN:  [id number], DOB:  [birth year], LOS: 10 ADMISSION DATE:  10/11/2022, CONSULTATION DATE:  10/19/2022 REFERRING MD:  Nelson Chimes - TRH, CHIEF COMPLAINT: Ventilator management  History of Present Illness:  [name] is a 82 y.o. with an extensive past medical history significant for but not limited to HFrEF, AAA, bilateral renal artery stenosis, CKD stage IIIb, COPD, CAD, HTN, HLD, PAF with history of DVT anticoagulated with Coumadin and IVC filter in place, PAD, and, subclavian artery stenosis who initially presented to the emergency department 6/18 for worsening right foot ulcer pain and discoloration.  He was admitted per [facility] with vascular surgery consultation.  Patient underwent abdominal aortogram with lower extremity with Dr. Durwin Nora 6/21 that revealed significant distal disease.   6/25 patient underwent multiple right lower extremity endarterectomies with patch placement per vascular.  Patient remained on pressors postprocedure and was transferred to ICU, PCCM consulted for ventilator management.  Pertinent  Medical History  HFrEF, AAA, bilateral renal artery stenosis, CKD stage IIIb, COPD, CAD, HTN, HLD, PAF with history of DVT anticoagulated with Coumadin and IVC filter in place, PAD, and, subclavian artery stenosis   Significant Hospital Events: Including procedures, antibiotic start and stop dates in addition to other pertinent events   6/18 admitted right foot gangrene 6/19 INR elevated, Coumadin held and vitamin K given per surgery 6/20 Zosyn started, amiodarone, Lopressor, and Lasix on hold 6/21 abdominal aortogram with lower extremity runoff consistent with significant distal disease 6/25 underwent multiple right lower extremity endarterectomies with patch placement per vascular 6/26 remains in the ICU for pressor dependent, PCCM consulted 6/27 no acute issues overnight, remains on low-dose vasopressors peripherally  Interim History / Subjective:  Pt reports  he is "processing" his canceled surgery today Back on low dose Neo overnight, currently on 20 mcg Reports improving pain in RLE over last two days  Objective   Blood pressure (!) 114/46, pulse 60, temperature 97.8 F (36.6 C), temperature source Oral, resp. rate 19, height 5\' 9"  (1.753 m), weight 69.5 kg, SpO2 96 %.        Intake/Output Summary (Last 24 hours) at 10/21/2022 4098 Last data filed at 10/21/2022 0800 Gross per 24 hour  Intake 1157.14 ml  Output 1075 ml  Net 82.14 ml   Filed Weights   10/11/22 1110 10/21/22 0530  Weight: 68 kg 69.5 kg    Examination: General:  chronically ill appearing elderly male sitting upright in bed in NAD HEENT: MM pink/moist, edentulous, pupils 2/r, anicteric Neuro: alert, oriented, non focal CV: rr, no obvious murmur, R femoral wound vac PULM:  clear, non labored GI: soft, bs+, NT Extremities: warm/dry, R popliteal incision cdi, mild/ improving foot discoloration, dry gangrenous toes, no LE edema, +pt bilateral w/doppler Skin: no rashes  UOP 915 ml/hr Net -2.6  Resolved Hospital Problem list     Assessment & Plan:  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 Strong history of peripheral arterial disease P: - per VVS - metatarsal amputation deferred for now, given ongoing vasopressor requirements - pressors for goal MAP > 65 - cont R groin wound vac (plans for 7 days) - IV heparin per pharmacy  - cont zosyn  - cont ASA/ statin - pain control prn, adding bowel regimen> unclear last BM  Abdominal aortic aneurysm -5.5 cm on recent imaging, enlarged per prior image P: - per VSS   Paroxysmal atrial fibrillation anticoagulated with Coumadin at baseline -Failed DCCV January 2024 P: - telemetry  monitoring - IV heparin per pharmacy  - cont PO amiodarone - hold BB while on pressors  History of DVT P: - heparin as above   HFrEF Aortic valve stenosis - Echocardiogram January 2024 EF 40 to 45% RV systolic  function within normal, severe aortic stenosis again seen  Essential hypertension - Home medications include Lasix P: - remains intermittently on low dose peripheral Neo, low diastolic pressures.  Given AS, change to NE to reduce afterload pressures and try small 250 ml bolus (overall net -2L, poor PO intake yest) - recheck echo - consider midodrine - hold hypertension meds - considering cardiology consult pending repeat echo  CKD stage IIIb -GFR at baseline 30-59 P: - sCr overall stable, slightly up x 2 days, monitor closely - hemodynamic support as above - Trend BMP / urinary output - Replace electrolytes as indicated - Avoid nephrotoxic agents, ensure adequate renal perfusion  History of COPD P: - prn BD, pulmonary hygiene/ IS  Hypothyroidism - TSH 0.74 (07/2022) P: - cont synthroid   Critical care time:    Performed by: Posey Boyer  Total critical care time: 38 minutes  Critical care time was exclusive of separately billable procedures and treating other patients.  Critical care was necessary to treat or prevent imminent or life-threatening deterioration.  Critical care was time spent personally by me on the following activities: development of treatment plan with patient and/or surrogate as well as nursing, discussions with consultants, evaluation of patient's response to treatment, examination of patient, obtaining history from patient or surrogate, ordering and performing treatments and interventions, ordering and review of laboratory studies, ordering and review of radiographic studies, pulse oximetry and re-evaluation of patient's condition.     Posey Boyer, MSN, NP, AG-ACNP-BC [facility] 10/21/2022, 8:12 AM  See Amion for pager If no response to pager , please call [phone] until 7pm After 7:00 pm call Elink  [phone]

## 2022-10-21 NOTE — Progress Notes (Signed)
ANTICOAGULATION CONSULT NOTE  Pharmacy Consult for heparin Indication: critical limb ischemia, hx afib/DVT  Allergies  Allergen Reactions   Plavix [Clopidogrel Bisulfate]     Brain hemorrhage prev while on plavix and aspirin    Patient Measurements: Height: 5\' 9"  (175.3 cm) Weight: 68 kg (149 lb 14.6 oz) IBW/kg (Calculated) : 70.7 Heparin Dosing Weight: 68 kg  Vital Signs: Temp: 98.5 F (36.9 C) (06/27 2000) Temp Source: Oral (06/27 2000) BP: 115/55 (06/28 0500) Pulse Rate: 61 (06/28 0500)  Labs: Recent Labs    10/19/22 0547 10/19/22 0943 10/20/22 0407 10/21/22 0452  HGB 11.1*  --  11.0* 10.3*  HCT 37.2*  --  37.2* 34.0*  PLT 350  --  383 300  HEPARINUNFRC  --  0.66  --  0.85*  CREATININE 1.39*  --  1.60*  --      Estimated Creatinine Clearance: 34.8 mL/min (A) (by C-G formula based on SCr of 1.6 mg/dL (H)).   Assessment: 61 yom with hx afib/DVT s/p IVC filter on warfarin PTA presenting with critical limb ischemia. He is now s/p endarterectomy/bypass on 6/25 and plans are for transmetatarsal amputation on Friday.  Pharmacy dosing heparin   Patient has been at goal on 1700u/hr since 6/23 however has been trending up. Heparin level supra-therapeutic this morning. Level was drawn via A-line, heparin was paused prior to getting levels. Anticipate potentially some role of accumulation.    Goal of Therapy:  Heparin level 0.3-0.7 units/ml Monitor platelets by anticoagulation protocol: Yes   Plan:  Decrease heparin to 1550u/hr.  Check 8 hour anti-Xa level. Monitor for s/sx of bleeding.   Estill Batten, PharmD, BCCCP  Clinical Pharmacist **Pharmacist phone directory can now be found on [email or web address] ([email or web address]).  Listed under [facility].

## 2022-10-21 NOTE — Progress Notes (Addendum)
Progress Note  VASCULAR SURGERY ASSESSMENT & PLAN:   POD 3 RIGHT ILIOFEMORAL ENDARTERECTOMY, VEIN PATCH ANGIOPLASTY, POPLITEAL ENDARTERECTOMY, RIGHT FEMORAL TO BELOW-KNEE POPLITEAL ARTERY BYPASS (PTFE): The patient has a brisk dorsalis pedis and posterior tibial signal with a Doppler.  Toes have demarcated.  Unfortunately he is still on neo and therefore I would not recommend proceeding with transmetatarsal amputation at this point.  If he is able to get off the neo this weekend and the foot shows some improvement we could potentially proceed with transmetatarsal amputation next week.  He remains on IV heparin.  Cari Caraway, MD 1:43 PM    10/21/2022 11:47 AM 3 Days Post-Op  Subjective:  no complaints.  R foot is feeling better   Vitals:   10/21/22 1000 10/21/22 1100  BP: (!) 134/46 (!) 124/44  Pulse: 60 (!) 58  Resp: 20 16  Temp:    SpO2: 93% 90%   Physical Exam: Lungs:  non labored Incisions:  R groin vac with good seal; R popliteal incision c/d/i Extremities:  R DP and PT by doppler; dry gangrene toes R foot Neurologic: A&O  CBC    Component Value Date/Time   WBC 14.2 (H) 10/21/2022 0452   RBC 3.88 (L) 10/21/2022 0452   HGB 10.3 (L) 10/21/2022 0452   HGB 14.4 05/06/2021 0953   HCT 34.0 (L) 10/21/2022 0452   HCT 42.6 05/06/2021 0953   PLT 300 10/21/2022 0452   PLT 199 05/06/2021 0953   MCV 87.6 10/21/2022 0452   MCV 87 05/06/2021 0953   MCV 87 06/03/2013 0454   MCH 26.5 10/21/2022 0452   MCHC 30.3 10/21/2022 0452   RDW 18.7 (H) 10/21/2022 0452   RDW 13.6 05/06/2021 0953   RDW 14.2 06/03/2013 0454   LYMPHSABS 1.1 10/11/2022 1056   LYMPHSABS 1.8 05/06/2021 0953   LYMPHSABS 2.7 07/28/2012 0513   MONOABS 0.9 10/11/2022 1056   MONOABS 1.4 (H) 07/28/2012 0513   EOSABS 0.2 10/11/2022 1056   EOSABS 0.3 05/06/2021 0953   EOSABS 0.3 07/28/2012 0513   BASOSABS 0.1 10/11/2022 1056   BASOSABS 0.1 05/06/2021 0953   BASOSABS 1 06/03/2013 0454   BASOSABS 0.1  07/28/2012 0513    BMET    Component Value Date/Time   NA 137 10/21/2022 1025   NA 142 05/06/2021 0953   NA 136 06/03/2013 0454   K 4.3 10/21/2022 1025   K 4.1 06/03/2013 0454   CL 104 10/21/2022 1025   CL 106 06/03/2013 0454   CO2 21 (L) 10/21/2022 1025   CO2 27 06/03/2013 0454   GLUCOSE 108 (H) 10/21/2022 1025   GLUCOSE 80 06/03/2013 0454   BUN 26 (H) 10/21/2022 1025   BUN 22 05/06/2021 0953   BUN 22 (H) 06/03/2013 0454   CREATININE 1.54 (H) 10/21/2022 1025   CREATININE 1.59 (H) 06/03/2013 0454   CREATININE 1.02 04/22/2011 1507   CALCIUM 8.0 (L) 10/21/2022 1025   CALCIUM 8.8 06/03/2013 0454   GFRNONAA 45 (L) 10/21/2022 1025   GFRNONAA 43 (L) 06/03/2013 0454   GFRAA 59 (L) 12/30/2016 0103   GFRAA 50 (L) 06/03/2013 0454    INR    Component Value Date/Time   INR 1.7 (H) 10/14/2022 0617   INR 1.3 06/02/2013 0746     Intake/Output Summary (Last 24 hours) at 10/21/2022 1147 Last data filed at 10/21/2022 0800 Gross per 24 hour  Intake 995.5 ml  Output 1075 ml  Net -79.5 ml     Assessment/Plan:  82 y.o. male  is s/p RIGHT ILIOFEMORAL ENDARTERECTOMY WITH VEIN PATCH/POPLITEAL ENDARTERECTOMY/RIGHT FEM BELOW-KNEE POP BYPASS with PTFE  3 Days Post-Op   Subjectively the right foot is starting to feel a little better.  He has a brisk DP and PT signal by Doppler.  The gangrene in the toes of the right foot remains dry with no obvious sign of infection.  Unfortunately he is still requiring pressor support.  We will postpone right TMA until he is able to be weaned from pressors.  We will continue right groin incisional VAC for 7 days postop.  Continue IV heparin for now.   Emilie Rutter, PA-C [facility] [phone] 10/21/2022 11:47 AM

## 2022-10-22 DIAGNOSIS — I480 Paroxysmal atrial fibrillation: Secondary | ICD-10-CM | POA: Diagnosis not present

## 2022-10-22 DIAGNOSIS — I350 Nonrheumatic aortic (valve) stenosis: Secondary | ICD-10-CM | POA: Diagnosis not present

## 2022-10-22 DIAGNOSIS — I714 Abdominal aortic aneurysm, without rupture, unspecified: Secondary | ICD-10-CM | POA: Diagnosis not present

## 2022-10-22 DIAGNOSIS — I70235 Atherosclerosis of native arteries of right leg with ulceration of other part of foot: Secondary | ICD-10-CM | POA: Diagnosis not present

## 2022-10-22 LAB — CBC
HCT: 32.6 % — ABNORMAL LOW (ref 39.0–52.0)
Hemoglobin: 10 g/dL — ABNORMAL LOW (ref 13.0–17.0)
MCH: 27.5 pg (ref 26.0–34.0)
MCHC: 30.7 g/dL (ref 30.0–36.0)
MCV: 89.6 fL (ref 80.0–100.0)
Platelets: 284 10*3/uL (ref 150–400)
RBC: 3.64 MIL/uL — ABNORMAL LOW (ref 4.22–5.81)
RDW: 18.9 % — ABNORMAL HIGH (ref 11.5–15.5)
WBC: 11.8 10*3/uL — ABNORMAL HIGH (ref 4.0–10.5)
nRBC: 0.2 % (ref 0.0–0.2)

## 2022-10-22 LAB — HEPARIN LEVEL (UNFRACTIONATED): Heparin Unfractionated: 0.33 IU/mL (ref 0.30–0.70)

## 2022-10-22 LAB — BASIC METABOLIC PANEL
Anion gap: 7 (ref 5–15)
BUN: 21 mg/dL (ref 8–23)
CO2: 22 mmol/L (ref 22–32)
Calcium: 8.1 mg/dL — ABNORMAL LOW (ref 8.9–10.3)
Chloride: 107 mmol/L (ref 98–111)
Creatinine, Ser: 1.49 mg/dL — ABNORMAL HIGH (ref 0.61–1.24)
GFR, Estimated: 47 mL/min — ABNORMAL LOW (ref >60)
Glucose, Bld: 101 mg/dL — ABNORMAL HIGH (ref 70–99)
Potassium: 4.2 mmol/L (ref 3.5–5.1)
Sodium: 136 mmol/L (ref 135–145)

## 2022-10-22 MED ORDER — LACTATED RINGERS IV BOLUS
250.0000 mL | Freq: Once | INTRAVENOUS | Status: AC
  Administered 2022-10-22: 250 mL via INTRAVENOUS

## 2022-10-22 MED ORDER — MIDODRINE HCL 5 MG PO TABS
15.0000 mg | ORAL_TABLET | Freq: Three times a day (TID) | ORAL | Status: DC
  Administered 2022-10-22 – 2022-10-23 (×5): 15 mg via ORAL
  Filled 2022-10-22 (×5): qty 3

## 2022-10-22 MED ORDER — PREGABALIN 50 MG PO CAPS
50.0000 mg | ORAL_CAPSULE | Freq: Three times a day (TID) | ORAL | Status: DC
  Administered 2022-10-22: 50 mg via ORAL
  Filled 2022-10-22: qty 1

## 2022-10-22 MED ORDER — PREGABALIN 25 MG PO CAPS
50.0000 mg | ORAL_CAPSULE | Freq: Every day | ORAL | Status: DC
  Administered 2022-10-23 – 2022-10-31 (×8): 50 mg via ORAL
  Filled 2022-10-22: qty 1
  Filled 2022-10-22 (×2): qty 2
  Filled 2022-10-22 (×3): qty 1
  Filled 2022-10-22 (×3): qty 2

## 2022-10-22 NOTE — Progress Notes (Signed)
ANTICOAGULATION CONSULT NOTE  Pharmacy Consult for heparin Indication: critical limb ischemia, hx afib/DVT  Allergies  Allergen Reactions   Plavix [Clopidogrel Bisulfate]     Brain hemorrhage prev while on plavix and aspirin    Patient Measurements: Height: 5\' 9"  (175.3 cm) Weight: 69.5 kg (153 lb 3.5 oz) IBW/kg (Calculated) : 70.7 Heparin Dosing Weight: 68 kg  Vital Signs: Temp: 99 F (37.2 C) (06/28 2230) Temp Source: Oral (06/28 2015) BP: 137/53 (06/29 0400) Pulse Rate: 62 (06/29 0445)  Labs: Recent Labs    10/19/22 0943 10/20/22 0407 10/20/22 0407 10/21/22 0452 10/21/22 1025 10/22/22 0503  HGB  --  11.0*   < > 10.3*  --  10.0*  HCT  --  37.2*  --  34.0*  --  32.6*  PLT  --  383  --  300  --  284  HEPARINUNFRC 0.66  --   --  0.85*  --  0.33  CREATININE  --  1.60*  --   --  1.54* 1.49*   < > = values in this interval not displayed.     Estimated Creatinine Clearance: 38.2 mL/min (A) (by C-G formula based on SCr of 1.49 mg/dL (H)).   Assessment: 80 yom with hx afib/DVT s/p IVC filter on warfarin PTA presenting with critical limb ischemia. He is now s/p endarterectomy/bypass on 6/25 and plans are for transmetatarsal amputation at some point.  Pharmacy dosing heparin.  Heparin level therapeutic at 0.33, CBC stable.  Goal of Therapy:  Heparin level 0.3-0.7 units/ml Monitor platelets by anticoagulation protocol: Yes   Plan:  Continue heparin 1550 units/h Daily heparin level, CBC  Fredonia Highland, PharmD, [place], [name] Clinical Pharmacist [phone] Please check AMION for all [facility] numbers 10/22/2022

## 2022-10-22 NOTE — TOC Progression Note (Signed)
Transition of Care [facility]) - Progression Note    Patient Details  Name: [name] MRN: [id number] Date of Birth: [birth year]  Transition of Care [facility]) CM/SW Contact  Donnalee Curry, LCSWA Phone Number: 10/22/2022, 12:34 PM  Clinical Narrative:     SW informed pt/wife requesting SNF closer to home rather than CIR.   SW spoke with pt's wife [name] [phone]) reports prior to recurrent foot issues. Pt used r/w for mobility and supervision for bathing. [name] confirmed she would prefer pt be closer to home. SW explained SNF search process, how to utilize Medicare.gov to research SNF's and [facility] coverage for rehab. [name] verbalized understanding.   Expected Discharge Plan: IP Rehab Facility Barriers to Discharge: Continued Medical Work up  Expected Discharge Plan and Services In-house Referral: NA Discharge Planning Services: CM Consult Post Acute Care Choice: Home Health Living arrangements for the past 2 months: Single Family Home                             HH Agency: [facility]         Social Determinants of Health (SDOH) Interventions SDOH Screenings   Food Insecurity: No Food Insecurity (10/14/2022)  Housing: Low Risk  (10/14/2022)  Transportation Needs: No Transportation Needs (10/14/2022)  Utilities: Not At Risk (10/14/2022)  Alcohol Screen: Low Risk  (07/13/2022)  Depression (PHQ2-9): Low Risk  (07/13/2022)  Financial Resource Strain: Low Risk  (07/13/2022)  Physical Activity: Insufficiently Active (07/13/2022)  Social Connections: Moderately Integrated (07/13/2022)  Stress: No Stress Concern Present (07/13/2022)  Tobacco Use: Medium Risk (10/19/2022)    Readmission Risk Interventions     No data to display

## 2022-10-22 NOTE — Progress Notes (Signed)
NAME:  [name], MRN:  [id number], DOB:  [birth year], LOS: 11 ADMISSION DATE:  10/11/2022, CONSULTATION DATE:  10/19/2022 REFERRING MD:  Nelson Chimes - TRH, CHIEF COMPLAINT: Ventilator management  History of Present Illness:  [name] is a 82 y.o. with an extensive past medical history significant for but not limited to HFrEF, AAA, bilateral renal artery stenosis, CKD stage IIIb, COPD, CAD, HTN, HLD, PAF with history of DVT anticoagulated with Coumadin and IVC filter in place, PAD, and, subclavian artery stenosis who initially presented to the emergency department 6/18 for worsening right foot ulcer pain and discoloration.  He was admitted per [facility] with vascular surgery consultation.  Patient underwent abdominal aortogram with lower extremity with Dr. Durwin Nora 6/21 that revealed significant distal disease.   6/25 patient underwent multiple right lower extremity endarterectomies with patch placement per vascular.  Patient remained on pressors postprocedure and was transferred to ICU, PCCM consulted for ventilator management.  Pertinent  Medical History  HFrEF, AAA, bilateral renal artery stenosis, CKD stage IIIb, COPD, CAD, HTN, HLD, PAF with history of DVT anticoagulated with Coumadin and IVC filter in place, PAD, and, subclavian artery stenosis   Significant Hospital Events: Including procedures, antibiotic start and stop dates in addition to other pertinent events   6/18 admitted right foot gangrene 6/19 INR elevated, Coumadin held and vitamin K given per surgery 6/20 Zosyn started, amiodarone, Lopressor, and Lasix on hold 6/21 abdominal aortogram with lower extremity runoff consistent with significant distal disease 6/25 underwent multiple right lower extremity endarterectomies with patch placement per vascular 6/26 remains in the ICU for pressor dependent, PCCM consulted 6/27 no acute issues overnight, remains on low-dose vasopressors peripherally  Interim History / Subjective:   Complaining of bilateral feet pain left more than right Continue to require vasopressor support to maintain MAP goal 65  Objective   Blood pressure (!) 131/51, pulse (!) 59, temperature 97.8 F (36.6 C), temperature source Oral, resp. rate 18, height 5\' 9"  (1.753 m), weight 69.5 kg, SpO2 96 %.        Intake/Output Summary (Last 24 hours) at 10/22/2022 0932 Last data filed at 10/22/2022 0800 Gross per 24 hour  Intake 1313.14 ml  Output 1470 ml  Net -156.86 ml   Filed Weights   10/11/22 1110 10/21/22 0530  Weight: 68 kg 69.5 kg    Examination: Physical exam: General: Chronically ill-appearing male, lying on the bed HEENT: [place]/AT, eyes anicteric.  moist mucus membranes Neuro: Alert, awake following commands Chest: Coarse breath sounds, no wheezes or rhonchi Heart: Regular rate and rhythm, no murmurs or gallops Abdomen: Soft, nontender, nondistended, bowel sounds present Skin: Gangrenous right great and third and fourth toe, right foot is swollen compared to left  Labs reviewed  Resolved Hospital Problem list     Assessment & Plan:  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 Extensive peripheral arterial disease Vascular surgery is following, plan to have metatarsal resection Continue to require vasopressor support with map goal 65, his surgery was postponed because of vasopressor requirement Continue IV heparin Continue aspirin and statin Started on Lyrica for neuropathic pain Continue as needed oxycodone  Abdominal aortic aneurysm 5.5 cm on recent imaging, enlarged per prior image Management per vascular surgery  Shock, septic/hypovolemic Patient is on Zosyn, white count is trending down Continue to require low-dose vasopressor support Increase midodrine to 15 mg 3 times daily, titrate off Levophed  Paroxysmal atrial fibrillation anticoagulated with Coumadin at baseline Failed DCCV January 2024 Remained in  sinus rhythm On heparin infusion for  stroke prophylaxis Continue amiodarone Holding beta-blocker in the setting of hypotension  History of DVT On anticoagulation with heparin  Chronic HFrEF Aortic valve stenosis Echocardiogram January 2024 EF 40 to 45% RV systolic function within normal, severe aortic stenosis again seen  Monitor intake and output  CKD stage IIIa Serum creatinine remained stable around 1.5 Avoid nephrotoxic agent Monitor intake and output  COPD, not in exacerbation Continue as needed bronchodilators  Hypothyroidism Continue Synthyroid  The patient is critically ill due to titration of vasopressors, shock, right foot gangrene.  Critical care was necessary to treat or prevent imminent or life-threatening deterioration.  Critical care was time spent personally by me on the following activities: development of treatment plan with patient and/or surrogate as well as nursing, discussions with consultants, evaluation of patient's response to treatment, examination of patient, obtaining history from patient or surrogate, ordering and performing treatments and interventions, ordering and review of laboratory studies, ordering and review of radiographic studies, pulse oximetry, re-evaluation of patient's condition and participation in multidisciplinary rounds.   During this encounter critical care time was devoted to patient care services described in this note for 32 minutes.     Cheri Fowler, MD [place] [facility] See Amion for pager If no response to pager, please call [phone] until 7pm After 7pm, Please call E-link [phone]

## 2022-10-22 NOTE — Progress Notes (Signed)
Inpatient Rehab Admissions:  Inpatient Rehab Consult received.  I met with patient and his wife at the bedside for rehabilitation assessment and to discuss goals and expectations of an inpatient rehab admission.  Discussed average length of stay and discharge home after completion of CIR. They acknowledged understanding. However, they would prefer a rehab facility in [place]. TOC made aware.  Signed: Wolfgang Phoenix, MS, CCC-SLP Admissions Coordinator [phone]

## 2022-10-23 DIAGNOSIS — I70235 Atherosclerosis of native arteries of right leg with ulceration of other part of foot: Secondary | ICD-10-CM | POA: Diagnosis not present

## 2022-10-23 DIAGNOSIS — I480 Paroxysmal atrial fibrillation: Secondary | ICD-10-CM | POA: Diagnosis not present

## 2022-10-23 DIAGNOSIS — I714 Abdominal aortic aneurysm, without rupture, unspecified: Secondary | ICD-10-CM | POA: Diagnosis not present

## 2022-10-23 DIAGNOSIS — I255 Ischemic cardiomyopathy: Secondary | ICD-10-CM | POA: Diagnosis not present

## 2022-10-23 LAB — BASIC METABOLIC PANEL
Anion gap: 8 (ref 5–15)
BUN: 21 mg/dL (ref 8–23)
CO2: 22 mmol/L (ref 22–32)
Calcium: 8.1 mg/dL — ABNORMAL LOW (ref 8.9–10.3)
Chloride: 105 mmol/L (ref 98–111)
Creatinine, Ser: 1.48 mg/dL — ABNORMAL HIGH (ref 0.61–1.24)
GFR, Estimated: 47 mL/min — ABNORMAL LOW (ref >60)
Glucose, Bld: 84 mg/dL (ref 70–99)
Potassium: 4.4 mmol/L (ref 3.5–5.1)
Sodium: 135 mmol/L (ref 135–145)

## 2022-10-23 LAB — CBC
HCT: 33.2 % — ABNORMAL LOW (ref 39.0–52.0)
Hemoglobin: 10.1 g/dL — ABNORMAL LOW (ref 13.0–17.0)
MCH: 26.7 pg (ref 26.0–34.0)
MCHC: 30.4 g/dL (ref 30.0–36.0)
MCV: 87.8 fL (ref 80.0–100.0)
Platelets: 316 10*3/uL (ref 150–400)
RBC: 3.78 MIL/uL — ABNORMAL LOW (ref 4.22–5.81)
RDW: 19 % — ABNORMAL HIGH (ref 11.5–15.5)
WBC: 11.2 10*3/uL — ABNORMAL HIGH (ref 4.0–10.5)
nRBC: 0.2 % (ref 0.0–0.2)

## 2022-10-23 LAB — HEPARIN LEVEL (UNFRACTIONATED): Heparin Unfractionated: 0.35 IU/mL (ref 0.30–0.70)

## 2022-10-23 NOTE — Progress Notes (Signed)
NAME:  RAMAL MCKETHAN, MRN:  [id number], DOB:  1941/03/09, LOS: 12 ADMISSION DATE:  10/11/2022, CONSULTATION DATE:  10/19/2022 REFERRING MD:  Nelson Chimes - TRH, CHIEF COMPLAINT: Ventilator management  History of Present Illness:  [name] is a 82 y.o. with an extensive past medical history significant for but not limited to HFrEF, AAA, bilateral renal artery stenosis, CKD stage IIIb, COPD, CAD, HTN, HLD, PAF with history of DVT anticoagulated with Coumadin and IVC filter in place, PAD, and, subclavian artery stenosis who initially presented to the emergency department 6/18 for worsening right foot ulcer pain and discoloration.  He was admitted per [facility] with vascular surgery consultation.  Patient underwent abdominal aortogram with lower extremity with Dr. Durwin Nora 6/21 that revealed significant distal disease.   6/25 patient underwent multiple right lower extremity endarterectomies with patch placement per vascular.  Patient remained on pressors postprocedure and was transferred to ICU, PCCM consulted for ventilator management.  Pertinent  Medical History  HFrEF, AAA, bilateral renal artery stenosis, CKD stage IIIb, COPD, CAD, HTN, HLD, PAF with history of DVT anticoagulated with Coumadin and IVC filter in place, PAD, and, subclavian artery stenosis   Significant Hospital Events: Including procedures, antibiotic start and stop dates in addition to other pertinent events   6/18 admitted right foot gangrene 6/19 INR elevated, Coumadin held and vitamin K given per surgery 6/20 Zosyn started, amiodarone, Lopressor, and Lasix on hold 6/21 abdominal aortogram with lower extremity runoff consistent with significant distal disease 6/25 underwent multiple right lower extremity endarterectomies with patch placement per vascular 6/26 remains in the ICU for pressor dependent, PCCM consulted 6/27 no acute issues overnight, remains on low-dose vasopressors peripherally  Interim History / Subjective:  Remain off  pressors No overnight issues  Objective   Blood pressure (!) 130/47, pulse (!) 58, temperature 97.6 F (36.4 C), temperature source Oral, resp. rate 18, height 5\' 9"  (1.753 m), weight 69.2 kg, SpO2 94 %.        Intake/Output Summary (Last 24 hours) at 10/23/2022 1150 Last data filed at 10/23/2022 1100 Gross per 24 hour  Intake 647.04 ml  Output 975 ml  Net -327.96 ml   Filed Weights   10/11/22 1110 10/21/22 0530 10/23/22 0600  Weight: 68 kg 69.5 kg 69.2 kg    Examination: Physical exam: General: Chronically ill-appearing male, lying on the bed HEENT: [place]/AT, eyes anicteric.  moist mucus membranes Neuro: Alert, awake following commands Chest: Coarse breath sounds, no wheezes or rhonchi Heart: Regular rate and rhythm, no murmurs or gallops Abdomen: Soft, nontender, nondistended, bowel sounds present Skin: Gangrenous right great, third and fourth toe.  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 Extensive peripheral arterial disease Vascular surgery is following, plan to have metatarsal resection on Tuesday Continue IV heparin Continue aspirin and statin Continue Lyrica 50 mg daily for neuropathic pain Continue as needed oxycodone  Abdominal aortic aneurysm 5.5 cm on recent imaging, enlarged per prior image Management per vascular surgery  Shock, septic/hypovolemic Continue Zosyn White count is trending down Off vasopressor support Continue midodrine 15 mg 3 times daily  Paroxysmal atrial fibrillation anticoagulated with Coumadin at baseline Failed DCCV January 2024 Remained in sinus rhythm On heparin infusion for stroke prophylaxis Continue amiodarone Holding beta-blocker in the setting of hypotension  History of DVT On anticoagulation with heparin  Chronic HFrEF Aortic  valve stenosis Echocardiogram January 2024 EF 40 to  45% RV systolic function within normal, severe aortic stenosis again seen  Monitor intake and output  CKD stage IIIa Serum creatinine remained stable around 1.5 Avoid nephrotoxic agent Monitor intake and output  COPD, not in exacerbation Continue as needed bronchodilators  Hypothyroidism Continue Synthyroid    Cheri Fowler, MD [place] [facility] See Amion for pager If no response to pager, please call [phone] until 7pm After 7pm, Please call E-link [phone]

## 2022-10-23 NOTE — Progress Notes (Signed)
ANTICOAGULATION CONSULT NOTE  Pharmacy Consult for heparin Indication: critical limb ischemia, hx afib/DVT  Allergies  Allergen Reactions   Plavix [Clopidogrel Bisulfate]     Brain hemorrhage prev while on plavix and aspirin    Patient Measurements: Height: 5\' 9"  (175.3 cm) Weight: 69.2 kg (152 lb 8.9 oz) IBW/kg (Calculated) : 70.7 Heparin Dosing Weight: 68 kg  Vital Signs: Temp: 97.6 F (36.4 C) (06/30 0815) Temp Source: Oral (06/30 0815) BP: 129/48 (06/30 1000) Pulse Rate: 103 (06/30 1030)  Labs: Recent Labs    10/21/22 0452 10/21/22 1025 10/22/22 0503 10/23/22 0124  HGB 10.3*  --  10.0* 10.1*  HCT 34.0*  --  32.6* 33.2*  PLT 300  --  284 316  HEPARINUNFRC 0.85*  --  0.33 0.35  CREATININE  --  1.54* 1.49* 1.48*     Estimated Creatinine Clearance: 38.3 mL/min (A) (by C-G formula based on SCr of 1.48 mg/dL (H)).   Assessment: 2 yom with hx afib/DVT s/p IVC filter on warfarin PTA presenting with critical limb ischemia. He is now s/p endarterectomy/bypass on 6/25 and plans are for transmetatarsal amputation at some point.  Pharmacy dosing heparin.  Heparin level therapeutic at 0.35, CBC stable.  Goal of Therapy:  Heparin level 0.3-0.7 units/ml Monitor platelets by anticoagulation protocol: Yes   Plan:  Continue heparin 1550 units/h Daily heparin level, CBC  Fredonia Highland, PharmD, [place], [facility] Clinical Pharmacist [phone] Please check AMION for all [facility] numbers 10/23/2022

## 2022-10-23 NOTE — Progress Notes (Signed)
    Subjective  - POD #5  No complaints this morning   Physical Exam:  Brisk pedal Doppler signals  Assessment/Plan:  POD #5  The patient is now off of neo-.  Recommend continuing heparin.  Potential plans for amputation on Tuesday.  Dr. [name] will see tomorrow  Durene Cal 10/23/2022 9:38 AM --  Vitals:   10/23/22 0830 10/23/22 0900  BP:  (!) 131/48  Pulse: (!) 58 (!) 59  Resp: 18 16  Temp:    SpO2: 94% 92%    Intake/Output Summary (Last 24 hours) at 10/23/2022 0938 Last data filed at 10/23/2022 0656 Gross per 24 hour  Intake 843.82 ml  Output 1075 ml  Net -231.18 ml     Laboratory CBC    Component Value Date/Time   WBC 11.2 (H) 10/23/2022 0124   HGB 10.1 (L) 10/23/2022 0124   HGB 14.4 05/06/2021 0953   HCT 33.2 (L) 10/23/2022 0124   HCT 42.6 05/06/2021 0953   PLT 316 10/23/2022 0124   PLT 199 05/06/2021 0953    BMET    Component Value Date/Time   NA 135 10/23/2022 0124   NA 142 05/06/2021 0953   NA 136 06/03/2013 0454   K 4.4 10/23/2022 0124   K 4.1 06/03/2013 0454   CL 105 10/23/2022 0124   CL 106 06/03/2013 0454   CO2 22 10/23/2022 0124   CO2 27 06/03/2013 0454   GLUCOSE 84 10/23/2022 0124   GLUCOSE 80 06/03/2013 0454   BUN 21 10/23/2022 0124   BUN 22 05/06/2021 0953   BUN 22 (H) 06/03/2013 0454   CREATININE 1.48 (H) 10/23/2022 0124   CREATININE 1.59 (H) 06/03/2013 0454   CREATININE 1.02 04/22/2011 1507   CALCIUM 8.1 (L) 10/23/2022 0124   CALCIUM 8.8 06/03/2013 0454   GFRNONAA 47 (L) 10/23/2022 0124   GFRNONAA 43 (L) 06/03/2013 0454   GFRAA 59 (L) 12/30/2016 0103   GFRAA 50 (L) 06/03/2013 0454    COAG Lab Results  Component Value Date   INR 1.7 (H) 10/14/2022   INR 1.8 (H) 10/13/2022   INR 2.8 (H) 10/12/2022   No results found for: "PTT"  Antibiotics Anti-infectives (From admission, onward)    Start     Dose/Rate Route Frequency Ordered Stop   10/13/22 2200  piperacillin-tazobactam (ZOSYN) IVPB 3.375 g        3.375  g 12.5 mL/hr over 240 Minutes Intravenous Every 8 hours 10/13/22 1232     10/13/22 1330  piperacillin-tazobactam (ZOSYN) IVPB 3.375 g        3.375 g 100 mL/hr over 30 Minutes Intravenous  Once 10/13/22 1232 10/14/22 1517        V. Charlena Cross, M.D., [facility] [place] Office: [phone] Pager:  [phone]

## 2022-10-24 DIAGNOSIS — R739 Hyperglycemia, unspecified: Secondary | ICD-10-CM

## 2022-10-24 DIAGNOSIS — I70235 Atherosclerosis of native arteries of right leg with ulceration of other part of foot: Secondary | ICD-10-CM | POA: Diagnosis not present

## 2022-10-24 DIAGNOSIS — D649 Anemia, unspecified: Secondary | ICD-10-CM

## 2022-10-24 DIAGNOSIS — I96 Gangrene, not elsewhere classified: Secondary | ICD-10-CM

## 2022-10-24 LAB — CBC
HCT: 33.3 % — ABNORMAL LOW (ref 39.0–52.0)
Hemoglobin: 10 g/dL — ABNORMAL LOW (ref 13.0–17.0)
MCH: 26.5 pg (ref 26.0–34.0)
MCHC: 30 g/dL (ref 30.0–36.0)
MCV: 88.3 fL (ref 80.0–100.0)
Platelets: 335 10*3/uL (ref 150–400)
RBC: 3.77 MIL/uL — ABNORMAL LOW (ref 4.22–5.81)
RDW: 19.1 % — ABNORMAL HIGH (ref 11.5–15.5)
WBC: 9.7 10*3/uL (ref 4.0–10.5)
nRBC: 0 % (ref 0.0–0.2)

## 2022-10-24 LAB — HEPARIN LEVEL (UNFRACTIONATED): Heparin Unfractionated: 0.39 IU/mL (ref 0.30–0.70)

## 2022-10-24 MED ORDER — MIDODRINE HCL 5 MG PO TABS
10.0000 mg | ORAL_TABLET | Freq: Three times a day (TID) | ORAL | Status: DC
  Administered 2022-10-24: 10 mg via ORAL
  Filled 2022-10-24 (×3): qty 2

## 2022-10-24 MED ORDER — CEFAZOLIN SODIUM-DEXTROSE 1-4 GM/50ML-% IV SOLN: 1.0000 g | INTRAVENOUS | Status: DC

## 2022-10-24 MED ORDER — CEFAZOLIN SODIUM-DEXTROSE 2-4 GM/100ML-% IV SOLN
2.0000 g | INTRAVENOUS | Status: AC
  Administered 2022-10-25: 2 g via INTRAVENOUS
  Filled 2022-10-24: qty 100

## 2022-10-24 MED ORDER — GERHARDT'S BUTT CREAM
1.0000 | TOPICAL_CREAM | Freq: Two times a day (BID) | CUTANEOUS | Status: DC
  Administered 2022-10-24 – 2022-10-31 (×15): 1 via TOPICAL
  Filled 2022-10-24 (×2): qty 1

## 2022-10-24 MED ORDER — BOOST / RESOURCE BREEZE PO LIQD CUSTOM
1.0000 | Freq: Three times a day (TID) | ORAL | Status: DC
  Administered 2022-10-24 – 2022-10-31 (×21): 1 via ORAL

## 2022-10-24 MED ORDER — MIDODRINE HCL 5 MG PO TABS
5.0000 mg | ORAL_TABLET | Freq: Three times a day (TID) | ORAL | Status: DC
  Administered 2022-10-24 (×2): 5 mg via ORAL
  Filled 2022-10-24 (×2): qty 1

## 2022-10-24 MED ORDER — MIDODRINE HCL 5 MG PO TABS
5.0000 mg | ORAL_TABLET | Freq: Once | ORAL | Status: AC
  Administered 2022-10-24: 5 mg via ORAL
  Filled 2022-10-24: qty 1

## 2022-10-24 NOTE — Progress Notes (Signed)
NAME:  [name], MRN:  [id number], DOB:  1941-01-31, LOS: 13 ADMISSION DATE:  10/11/2022, CONSULTATION DATE:  10/19/2022 REFERRING MD:  Nelson Chimes - TRH, CHIEF COMPLAINT: Ventilator management  History of Present Illness:  [name] is a 82 y.o. with an extensive past medical history significant for but not limited to HFrEF, AAA, bilateral renal artery stenosis, CKD stage IIIb, COPD, CAD, HTN, HLD, PAF with history of DVT anticoagulated with Coumadin and IVC filter in place, PAD, and, subclavian artery stenosis who initially presented to the emergency department 6/18 for worsening right foot ulcer pain and discoloration.  He was admitted per [facility] with vascular surgery consultation.  Patient underwent abdominal aortogram with lower extremity with Dr. Durwin Nora 6/21 that revealed significant distal disease.   6/25 patient underwent multiple right lower extremity endarterectomies with patch placement per vascular.  Patient remained on pressors postprocedure and was transferred to ICU, PCCM consulted for ventilator management.  Pertinent  Medical History  HFrEF, AAA, bilateral renal artery stenosis, CKD stage IIIb, COPD, CAD, HTN, HLD, PAF with history of DVT anticoagulated with Coumadin and IVC filter in place, PAD, and, subclavian artery stenosis   Significant Hospital Events: Including procedures, antibiotic start and stop dates in addition to other pertinent events   6/18 admitted right foot gangrene 6/19 INR elevated, Coumadin held and vitamin K given per surgery 6/20 Zosyn started, amiodarone, Lopressor, and Lasix on hold 6/21 abdominal aortogram with lower extremity runoff consistent with significant distal disease 6/25 underwent multiple right lower extremity endarterectomies with patch placement per vascular 6/26 remains in the ICU for pressor dependent, PCCM consulted 6/27 no acute issues overnight, remains on low-dose vasopressors peripherally  Interim History / Subjective:  Appetite  has been poor but he has been trying to eat at meals.  Pain is well-controlled with meds.  Objective   Blood pressure (!) 144/53, pulse (!) 55, temperature 98.2 F (36.8 C), temperature source Oral, resp. rate 17, height 5\' 9"  (1.753 m), weight 68 kg, SpO2 93 %.        Intake/Output Summary (Last 24 hours) at 10/24/2022 0751 Last data filed at 10/24/2022 4098 Gross per 24 hour  Intake 772.43 ml  Output 1235 ml  Net -462.57 ml    Filed Weights   10/21/22 0530 10/23/22 0600 10/24/22 0600  Weight: 69.5 kg 69.2 kg 68 kg    Examination: Physical exam: General: Elderly man lying in bed no acute distress, frail-appearing HEENT: [place]/AT, eyes anicteric Neuro: Awake, alert, moving all extremities.  Answering questions appropriately. Chest: Breathing comfortably on nasal cannula, CTAB Heart: S1-S2, bradycardic, regular rhythm Abdomen: Soft, nontender, nondistended Skin: Gangrenous toes on the right foot, no diffuse rashes Extremities edema from right foot to lower shin, otherwise no peripheral edema  BUN 21 Cr 1.48 WBC 9.7 H/H10/33.3 Platelets 335 Echo LVEF 55-60%, mild LVH, trivial pericardial effusion. Trivial AI, MR. Reduced IVC respiratory variability.   Resolved Hospital Problem list     Assessment & Plan:  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 Extensive peripheral arterial disease -Planning for metatarsal resection tomorrow with vascular surgery, NPO past midnight. - Continue IV heparin - Daily aspirin and statin - Continue pain control-Lyrica and oxycodone.  Abdominal aortic aneurysm- 5.5 cm on recent imaging, enlarged per prior image -Defer management to vascular surgery, hypertension control, statin  Shock-mixed septic and hypovolemic-resolved -Continue Zosyn - Decrease midodrine to 5 mg 3 times daily  Paroxysmal atrial fibrillation anticoagulated with Coumadin PTA. Failed DCCV January 2024.  Currently in sinus rhythm. -Continue amiodarone,  holding beta-blocker currently. - Continue heparin infusion.  Coumadin on hold due to operative needs.  History of DVT -Heparin infusion  Chronic HFrEF; has had recovery of ejection fraction to 55 to 60% on most recent echocardiogram Severe aortic valve stenosis -Will need outpatient close cardiology follow-up for severe AS; currently gangrenous foot would preclude any safe way to intervene on his valve surgically.  CKD stage IIIa -Strict I's/O - Renally dose meds and avoid nephrotoxic meds  COPD, not acutely exacerbated -Bronchodilators as needed  Hypothyroidism -Continue PTA synthyroid  Anemia -Transfuse for hemoglobin less than 7 or hemodynamically significant bleeding - Monitor  Steffanie Dunn, DO 10/24/22 10:59 AM [place] [facility]  For contact information, see Amion. If no response to pager, please call PCCM consult pager. After hours, 7PM- 7AM, please call Elink.

## 2022-10-24 NOTE — Progress Notes (Addendum)
Progress Note  VASCULAR SURGERY ASSESSMENT & PLAN:   POD 6 RIGHT ILIOFEMORAL ENDARTERECTOMY, VEIN PATCH ANGIOPLASTY, POPLITEAL ENDARTERECTOMY, RIGHT FEMORAL TO BELOW-KNEE POPLITEAL ARTERY BYPASS (PTFE): The patient has a brisk dorsalis pedis and posterior tibial signal with a Doppler.  He is off the neo now.  I think we can proceed with right transmetatarsal amputation tomorrow. Despite excellent Doppler signals at the level of the ankle and proximal foot there is still significant risk of nonhealing given his small vessel disease.  He understands this.    He remains on IV heparin.  We will need to hold this on-call to the OR tomorrow.  Cari Caraway, MD 7:31 AM   10/24/2022 7:10 AM 6 Days Post-Op  Subjective:  says he feels about the same.  Has concerns about being deconditioned as he was able to get up and about with his walker the day before admission.  afebrile  Vitals:   10/24/22 0500 10/24/22 0600  BP: (!) 135/51 (!) 144/53  Pulse: (!) 50 (!) 55  Resp: 19 17  Temp:    SpO2: 91% 93%    Physical Exam: General:  no distress Cardiac:  regular Lungs:  non labored Incisions:  right below knee incision looks good.  Prevena seal not intact and prevena removed.  Incision looks great.   Extremities:  brisk doppler flow right DP/PT; toes on right foot demarcating.  Abdomen:  soft  CBC    Component Value Date/Time   WBC 9.7 10/24/2022 0426   RBC 3.77 (L) 10/24/2022 0426   HGB 10.0 (L) 10/24/2022 0426   HGB 14.4 05/06/2021 0953   HCT 33.3 (L) 10/24/2022 0426   HCT 42.6 05/06/2021 0953   PLT 335 10/24/2022 0426   PLT 199 05/06/2021 0953   MCV 88.3 10/24/2022 0426   MCV 87 05/06/2021 0953   MCV 87 06/03/2013 0454   MCH 26.5 10/24/2022 0426   MCHC 30.0 10/24/2022 0426   RDW 19.1 (H) 10/24/2022 0426   RDW 13.6 05/06/2021 0953   RDW 14.2 06/03/2013 0454   LYMPHSABS 1.1 10/11/2022 1056   LYMPHSABS 1.8 05/06/2021 0953   LYMPHSABS 2.7 07/28/2012 0513   MONOABS 0.9  10/11/2022 1056   MONOABS 1.4 (H) 07/28/2012 0513   EOSABS 0.2 10/11/2022 1056   EOSABS 0.3 05/06/2021 0953   EOSABS 0.3 07/28/2012 0513   BASOSABS 0.1 10/11/2022 1056   BASOSABS 0.1 05/06/2021 0953   BASOSABS 1 06/03/2013 0454   BASOSABS 0.1 07/28/2012 0513    BMET    Component Value Date/Time   NA 135 10/23/2022 0124   NA 142 05/06/2021 0953   NA 136 06/03/2013 0454   K 4.4 10/23/2022 0124   K 4.1 06/03/2013 0454   CL 105 10/23/2022 0124   CL 106 06/03/2013 0454   CO2 22 10/23/2022 0124   CO2 27 06/03/2013 0454   GLUCOSE 84 10/23/2022 0124   GLUCOSE 80 06/03/2013 0454   BUN 21 10/23/2022 0124   BUN 22 05/06/2021 0953   BUN 22 (H) 06/03/2013 0454   CREATININE 1.48 (H) 10/23/2022 0124   CREATININE 1.59 (H) 06/03/2013 0454   CREATININE 1.02 04/22/2011 1507   CALCIUM 8.1 (L) 10/23/2022 0124   CALCIUM 8.8 06/03/2013 0454   GFRNONAA 47 (L) 10/23/2022 0124   GFRNONAA 43 (L) 06/03/2013 0454   GFRAA 59 (L) 12/30/2016 0103   GFRAA 50 (L) 06/03/2013 0454    INR    Component Value Date/Time   INR 1.7 (H) 10/14/2022 0617   INR  1.3 06/02/2013 0746     Intake/Output Summary (Last 24 hours) at 10/24/2022 0710 Last data filed at 10/24/2022 9604 Gross per 24 hour  Intake 772.43 ml  Output 1235 ml  Net -462.57 ml      Assessment/Plan:  82 y.o. male is s/p:  Right iliofemoral endarterectomy and right femoral popliteal bypass with PTFE 10/18/2022 by Dr. [name] 6 Days Post-Op   -pt with brisk doppler flow right DP/PT; toes demarcating -pt is off neo.  Plan for OR tomorrow for right TMA.  Npo after MN and consent ordered.   -PT to work with pt and OOB tid at meal times.  -DVT prophylaxis:  heparin   Doreatha Massed, PA-C [facility] [phone] 10/24/2022 7:10 AM

## 2022-10-24 NOTE — Progress Notes (Signed)
ANTICOAGULATION CONSULT NOTE  Pharmacy Consult for heparin Indication: critical limb ischemia, hx afib/DVT  Allergies  Allergen Reactions   Plavix [Clopidogrel Bisulfate]     Brain hemorrhage prev while on plavix and aspirin    Patient Measurements: Height: 5\' 9"  (175.3 cm) Weight: 68 kg (149 lb 14.6 oz) IBW/kg (Calculated) : 70.7 Heparin Dosing Weight: 68 kg  Vital Signs: Temp: 97.5 F (36.4 C) (07/01 0752) Temp Source: Oral (07/01 0752) BP: 144/53 (07/01 0600) Pulse Rate: 55 (07/01 0600)  Labs: Recent Labs    10/21/22 1025 10/22/22 0503 10/22/22 0503 10/23/22 0124 10/24/22 0426  HGB  --  10.0*   < > 10.1* 10.0*  HCT  --  32.6*  --  33.2* 33.3*  PLT  --  284  --  316 335  HEPARINUNFRC  --  0.33  --  0.35 0.39  CREATININE 1.54* 1.49*  --  1.48*  --    < > = values in this interval not displayed.     Estimated Creatinine Clearance: 37.7 mL/min (A) (by C-G formula based on SCr of 1.48 mg/dL (H)).   Assessment: 37 yom with hx afib/DVT s/p IVC filter on warfarin PTA presenting with critical limb ischemia. He is now s/p endarterectomy/bypass on 6/25 and plans are for transmetatarsal amputation on 7/2.  Pharmacy dosing heparin.  Heparin level therapeutic at 0.39, CBC stable.  Goal of Therapy:  Heparin level 0.3-0.7 units/ml Monitor platelets by anticoagulation protocol: Yes   Plan:  Continue heparin 1550 units/h Daily heparin level, CBC  Harland German, PharmD Clinical Pharmacist **Pharmacist phone directory can now be found on [email or web address] ([email or web address]).  Listed under [facility].

## 2022-10-24 NOTE — Consult Note (Signed)
WOC Nurse Consult Note: Reason for Consult:Deep tissue injury to sacrum.  Severe peripheral arterial disease and is scheduled for a transmetatarsal amputation right foot tomorrow. .  Recent endarterectomy and has incision line just below knee, closed with glue. Patient is able to turn himself in bed independently. We discuss the importance of alternating pressure (pillows under hips, turning to offload, etc) and he agrees to do this.  Wound type: pressure  injury to sacrum Pressure Injury POA: No Measurement: 2 cm x 1.5 cm maroon discoloration with 0.2 cm nonintact center.  Wound bed:red and moist Drainage (amount, consistency, odor) scant weeping  Has sacral foam dressing in place. Patient states this padding has alleviated pain.  Has heel foam dressings in place.  Right heel with nonblanchable erythema present. Will request prevalon boots.  Periwound: Right leg red and cooler, gangrenous toes  Dressing procedure/placement/frequency: cleanse sacral wound with soap and water and pat dry.  Silicone foam in place, change every three days and PRN soilage.  Encourage to turn and reposition every two hours.  Prevalon boots bilaterally, Will not follow at this time.  Please re-consult if needed.  Mike Gip MSN, RN, FNP-BC CWON Wound, Ostomy, Continence Nurse Outpatient [facility] [phone] Pager [phone]

## 2022-10-24 NOTE — Progress Notes (Signed)
Physical Therapy Treatment Patient Details Name: [name] MRN: [id number] DOB: [birth year] Today's Date: 10/24/2022   History of Present Illness 82 yo m referred to ED 6/18 by physician with critical limb ischemia. He is now s/p endarterectomy/bypass on 6/25 and plans are for transmetatarsal amputation on 6/28. 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    PT Comments  Pt received on BSC, seen with OT. Pt responsive to PT/OT and gave good effort this date. Pt with c/o R LE pain and it giving out in standing. Pt depressed regarding how fast his function has declined from last Tuesday when he was completely indep. Pt did amb about 6' today with RW. R transmet surgery planned for tomorrow. Acute PT to cont to follow. Family prefers to be closer to home for rehab therefore recommend SNF level. Acute PT to cont to follow.     Assistance Recommended at Discharge Frequent or constant Supervision/Assistance  If plan is discharge home, recommend the following:  Can travel by private vehicle    A lot of help with walking and/or transfers;A lot of help with bathing/dressing/bathroom;Assistance with cooking/housework;Direct supervision/assist for medications management;Direct supervision/assist for financial management;Assist for transportation;Help with stairs or ramp for entrance   No  Equipment Recommendations  None recommended by PT    Recommendations for Other Services       Precautions / Restrictions Precautions Precautions: Fall Precaution Comments: watch BP Restrictions Weight Bearing Restrictions: No     Mobility  Bed Mobility               General bed mobility comments: pt received on BSC     Transfers Overall transfer level: Needs assistance Equipment used: Rolling walker (2 wheels) Transfers: Sit to/from Stand Sit to Stand: +2 physical assistance, Min assist           General transfer comment: cues for hand placement, assist to rise and steady, heavy reliance on UE, use of momentum, completed 3 sit to stands    Ambulation/Gait Ambulation/Gait assistance: Mod assist, +2 safety/equipment Gait Distance (Feet): 6 Feet Assistive device: Rolling walker (2 wheels) Gait Pattern/deviations: Step-to pattern, Decreased stance time - right, Decreased step length - left Gait velocity: decreased and antalgic     General Gait Details: with max encouragement pt able to take steps, pt requiring progressive support on L side despite use of RW with increased distance   Stairs             Wheelchair Mobility     Tilt Bed    Modified Rankin (Stroke Patients Only)       Balance Overall balance assessment: Needs assistance Sitting-balance support: Feet supported, No upper extremity supported, Bilateral upper extremity supported, Single extremity supported Sitting balance-Leahy Scale: Fair     Standing balance support: During functional activity, Reliant on assistive device for balance, Bilateral upper extremity supported Standing balance-Leahy Scale: Poor Standing balance comment: able to stand from [facility] for ~3 min for hygiene s/p BM before pain requires him to sit                            Cognition Arousal/Alertness: Awake/alert Behavior During Therapy: WFL for tasks assessed/performed Overall Cognitive Status: Within Functional Limits  for tasks assessed                                 General Comments: needs encouragement to maximize participation, thrives off humor        Exercises      General Comments General comments (skin integrity, edema, etc.): BP soft, HR 135      Pertinent Vitals/Pain Pain Assessment Pain  Assessment: Faces Faces Pain Scale: Hurts even more Pain Location: R foot Pain Descriptors / Indicators: Discomfort Pain Intervention(s): Monitored during session    Home Living                          Prior Function            PT Goals (current goals can now be found in the care plan section) Acute Rehab PT Goals PT Goal Formulation: With patient/family Time For Goal Achievement: 11/02/22 Potential to Achieve Goals: Good Progress towards PT goals: Progressing toward goals    Frequency    Min 1X/week      PT Plan Discharge plan needs to be updated    Co-evaluation PT/OT/SLP Co-Evaluation/Treatment: Yes Reason for Co-Treatment: For patient/therapist safety PT goals addressed during session: Mobility/safety with mobility OT goals addressed during session: ADL's and self-care      AM-PAC PT "6 Clicks" Mobility   Outcome Measure  Help needed turning from your back to your side while in a flat bed without using bedrails?: A Little Help needed moving from lying on your back to sitting on the side of a flat bed without using bedrails?: A Lot Help needed moving to and from a bed to a chair (including a wheelchair)?: A Lot Help needed standing up from a chair using your arms (e.g., wheelchair or bedside chair)?: A Lot Help needed to walk in hospital room?: A Lot Help needed climbing 3-5 steps with a railing? : Total 6 Click Score: 12    End of Session Equipment Utilized During Treatment: Gait belt Activity Tolerance: Patient limited by pain Patient left: in chair;with call bell/phone within reach;with family/visitor present Nurse Communication: Mobility status PT Visit Diagnosis: Unsteadiness on feet (R26.81);Other abnormalities of gait and mobility (R26.89);Muscle weakness (generalized) (M62.81);Difficulty in walking, not elsewhere classified (R26.2);History of falling (Z91.81);Repeated falls (R29.6);Pain Pain - Right/Left: Right Pain - part of body: Ankle  and joints of foot     Time: 4098-1191 PT Time Calculation (min) (ACUTE ONLY): 32 min  Charges:    $Gait Training: 8-22 mins PT General Charges $$ ACUTE PT VISIT: 1 Visit                     Lewis Shock, PT, DPT Acute Rehabilitation Services Secure chat preferred Office #: [phone]    Iona Hansen 10/24/2022, 2:38 PM

## 2022-10-24 NOTE — Progress Notes (Signed)
Occupational Therapy Treatment Patient Details Name: [name] MRN: [id number] DOB: [birth year] Today's Date: 10/24/2022   History of present illness 82 yo m referred to ED 6/18 by physician with critical limb ischemia. He is now s/p endarterectomy/bypass on 6/25 and plans are for transmetatarsal amputation on 6/28. 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   OT comments  Pt received on BSC. Stood with +2 min assist and RW. Total assist for pericare. Pt demonstrated ability to take a few steps with close chair follow. Agreed to remain up in chair for lunch. R foot surgery scheduled for tomorrow. Updated d/c recommendation to SNF, family prefers to be near home.    Recommendations for follow up therapy are one component of a multi-disciplinary discharge planning process, led by the attending physician.  Recommendations may be updated based on patient status, additional functional criteria and insurance authorization.    Assistance Recommended at Discharge Frequent or constant Supervision/Assistance  Patient can return home with the following  Two people to help with walking and/or transfers;A lot of help with bathing/dressing/bathroom;Assistance with cooking/housework;Assist for transportation;Help with stairs or ramp for entrance   Equipment Recommendations  None recommended by OT    Recommendations for Other Services      Precautions / Restrictions Precautions Precautions: Fall Precaution Comments: watch BP Restrictions Weight Bearing Restrictions: No       Mobility Bed Mobility               General bed mobility comments: pt received on BSC    Transfers Overall transfer level: Needs  assistance Equipment used: Rolling walker (2 wheels) Transfers: Sit to/from Stand Sit to Stand: +2 physical assistance, Min assist           General transfer comment: cues for hand placement, assist to rise and steady, heavy reliance on UE, use of momentum     Balance Overall balance assessment: Needs assistance   Sitting balance-Leahy Scale: Fair       Standing balance-Leahy Scale: Poor                             ADL either performed or assessed with clinical judgement   ADL Overall ADL's : Needs assistance/impaired     Grooming: Set up;Sitting                   Toilet Transfer: +2 for physical assistance;Minimal assistance;Rolling walker (2 wheels);BSC/3in1   Toileting- Clothing Manipulation and Hygiene: Total assistance;Sit to/from stand;+2 for physical assistance       Functional mobility during ADLs: +2 for physical assistance;Moderate assistance;Rolling walker (2 wheels)      Extremity/Trunk Assessment              Vision       Perception     Praxis      Cognition Arousal/Alertness: Awake/alert Behavior During Therapy: WFL for tasks assessed/performed Overall Cognitive Status: Within Functional Limits for tasks assessed                                 General Comments: needs encouragement to maximize participation        Exercises  Shoulder Instructions       General Comments      Pertinent Vitals/ Pain       Pain Assessment Pain Assessment: Faces Faces Pain Scale: Hurts a little bit Pain Location: buttocks with pericare Pain Descriptors / Indicators: Discomfort Pain Intervention(s): Monitored during session, Repositioned  Home Living                                          Prior Functioning/Environment              Frequency  Min 1X/week        Progress Toward Goals  OT Goals(current goals can now be found in the care plan section)  Progress towards OT  goals: Progressing toward goals  Acute Rehab OT Goals OT Goal Formulation: With patient Time For Goal Achievement: 11/02/22 Potential to Achieve Goals: Good  Plan Discharge plan needs to be updated    Co-evaluation    PT/OT/SLP Co-Evaluation/Treatment: Yes Reason for Co-Treatment: For patient/therapist safety   OT goals addressed during session: ADL's and self-care      AM-PAC OT "6 Clicks" Daily Activity     Outcome Measure   Help from another person eating meals?: None Help from another person taking care of personal grooming?: A Little Help from another person toileting, which includes using toliet, bedpan, or urinal?: Total Help from another person bathing (including washing, rinsing, drying)?: A Lot Help from another person to put on and taking off regular upper body clothing?: A Little Help from another person to put on and taking off regular lower body clothing?: Total 6 Click Score: 14    End of Session Equipment Utilized During Treatment: Gait belt;Rolling walker (2 wheels)  OT Visit Diagnosis: Unsteadiness on feet (R26.81);Other abnormalities of gait and mobility (R26.89);Pain;Muscle weakness (generalized) (M62.81);History of falling (Z91.81)   Activity Tolerance Patient tolerated treatment well   Patient Left in chair;with call bell/phone within reach   Nurse Communication Mobility status;Other (comment) (needs new sacral patch)        Time: 1610-9604 OT Time Calculation (min): 32 min  Charges: OT General Charges $OT Visit: 1 Visit OT Treatments $Self Care/Home Management : 8-22 mins  Berna Spare, OTR/L Acute Rehabilitation Services Office: [phone]   [name] 10/24/2022, 12:17 PM

## 2022-10-24 NOTE — Progress Notes (Signed)
eLink Physician-Brief Progress Note Patient Name: [name] DOB: [birth year] MRN: [id number]   Date of Service  10/24/2022  HPI/Events of Note  BSRN suggesting to discotniue a-line after drawing AM labs and discontinue foley Also requesting for Gerhardt Butt Cream for pt's reddned bottom.  Noted plans for surgery tomorrow  eICU Interventions  Gerhardt Butt cream ordered Wound care to be consulted Will defer aline and foley removal to bedside rounding team given scheduled surgery Discussed with bedside RN     Intervention Category Intermediate Interventions: Other:  Darl Pikes 10/24/2022, 12:46 AM

## 2022-10-25 ENCOUNTER — Other Ambulatory Visit: Payer: Self-pay

## 2022-10-25 ENCOUNTER — Encounter (HOSPITAL_COMMUNITY): Payer: Self-pay | Admitting: Internal Medicine

## 2022-10-25 ENCOUNTER — Inpatient Hospital Stay (HOSPITAL_COMMUNITY): Payer: Medicare Other | Admitting: Anesthesiology

## 2022-10-25 ENCOUNTER — Encounter (HOSPITAL_COMMUNITY)
Admission: EM | Disposition: A | Discharge status: Skilled Nursing Facility | Payer: Self-pay | Source: Ambulatory Visit | Attending: Internal Medicine

## 2022-10-25 DIAGNOSIS — I130 Hypertensive heart and chronic kidney disease with heart failure and stage 1 through stage 4 chronic kidney disease, or unspecified chronic kidney disease: Secondary | ICD-10-CM

## 2022-10-25 DIAGNOSIS — I96 Gangrene, not elsewhere classified: Secondary | ICD-10-CM

## 2022-10-25 DIAGNOSIS — L089 Local infection of the skin and subcutaneous tissue, unspecified: Secondary | ICD-10-CM | POA: Diagnosis not present

## 2022-10-25 DIAGNOSIS — R739 Hyperglycemia, unspecified: Secondary | ICD-10-CM | POA: Diagnosis not present

## 2022-10-25 DIAGNOSIS — E039 Hypothyroidism, unspecified: Secondary | ICD-10-CM

## 2022-10-25 DIAGNOSIS — I70235 Atherosclerosis of native arteries of right leg with ulceration of other part of foot: Secondary | ICD-10-CM | POA: Diagnosis not present

## 2022-10-25 DIAGNOSIS — N1831 Chronic kidney disease, stage 3a: Secondary | ICD-10-CM | POA: Diagnosis not present

## 2022-10-25 DIAGNOSIS — N183 Chronic kidney disease, stage 3 unspecified: Secondary | ICD-10-CM

## 2022-10-25 DIAGNOSIS — I5030 Unspecified diastolic (congestive) heart failure: Secondary | ICD-10-CM

## 2022-10-25 HISTORY — PX: TRANSMETATARSAL AMPUTATION: SHX6197

## 2022-10-25 LAB — MAGNESIUM: Magnesium: 2.2 mg/dL (ref 1.7–2.4)

## 2022-10-25 LAB — CBC
HCT: 32.1 % — ABNORMAL LOW (ref 39.0–52.0)
Hemoglobin: 9.6 g/dL — ABNORMAL LOW (ref 13.0–17.0)
MCH: 26.4 pg (ref 26.0–34.0)
MCHC: 29.9 g/dL — ABNORMAL LOW (ref 30.0–36.0)
MCV: 88.4 fL (ref 80.0–100.0)
Platelets: 336 10*3/uL (ref 150–400)
RBC: 3.63 MIL/uL — ABNORMAL LOW (ref 4.22–5.81)
RDW: 19.5 % — ABNORMAL HIGH (ref 11.5–15.5)
WBC: 11.1 10*3/uL — ABNORMAL HIGH (ref 4.0–10.5)
nRBC: 0.3 % — ABNORMAL HIGH (ref 0.0–0.2)

## 2022-10-25 LAB — BASIC METABOLIC PANEL
Anion gap: 8 (ref 5–15)
BUN: 21 mg/dL (ref 8–23)
CO2: 22 mmol/L (ref 22–32)
Calcium: 7.9 mg/dL — ABNORMAL LOW (ref 8.9–10.3)
Chloride: 106 mmol/L (ref 98–111)
Creatinine, Ser: 1.54 mg/dL — ABNORMAL HIGH (ref 0.61–1.24)
GFR, Estimated: 45 mL/min — ABNORMAL LOW (ref >60)
Glucose, Bld: 92 mg/dL (ref 70–99)
Potassium: 3.5 mmol/L (ref 3.5–5.1)
Sodium: 136 mmol/L (ref 135–145)

## 2022-10-25 LAB — HEPARIN LEVEL (UNFRACTIONATED): Heparin Unfractionated: 0.42 IU/mL (ref 0.30–0.70)

## 2022-10-25 SURGERY — AMPUTATION, FOOT, TRANSMETATARSAL
Anesthesia: Monitor Anesthesia Care | Site: Foot | Laterality: Right

## 2022-10-25 MED ORDER — BACITRACIN ZINC 500 UNIT/GM EX OINT
TOPICAL_OINTMENT | CUTANEOUS | Status: DC | PRN
  Administered 2022-10-25: 1 via TOPICAL

## 2022-10-25 MED ORDER — LACTATED RINGERS IV SOLN: INTRAVENOUS | Status: DC

## 2022-10-25 MED ORDER — HEPARIN (PORCINE) 25000 UT/250ML-% IV SOLN: 1550.0000 [IU]/h | INTRAVENOUS | Status: DC

## 2022-10-25 MED ORDER — FENTANYL CITRATE (PF) 100 MCG/2ML IJ SOLN: 25.0000 ug | INTRAMUSCULAR | Status: DC | PRN

## 2022-10-25 MED ORDER — POLYETHYLENE GLYCOL 3350 17 G PO PACK
17.0000 g | PACK | Freq: Every day | ORAL | Status: DC
  Filled 2022-10-25 (×4): qty 1

## 2022-10-25 MED ORDER — PHENYLEPHRINE HCL (PRESSORS) 10 MG/ML IV SOLN
INTRAVENOUS | Status: DC | PRN
  Administered 2022-10-25: 160 ug via INTRAVENOUS

## 2022-10-25 MED ORDER — ORAL CARE MOUTH RINSE: 15.0000 mL | Freq: Once | OROMUCOSAL | Status: AC

## 2022-10-25 MED ORDER — ROPIVACAINE HCL 5 MG/ML IJ SOLN
INTRAMUSCULAR | Status: DC | PRN
  Administered 2022-10-25: 20 mL via PERINEURAL

## 2022-10-25 MED ORDER — MIDAZOLAM HCL 2 MG/2ML IJ SOLN
INTRAMUSCULAR | Status: AC
  Filled 2022-10-25: qty 2

## 2022-10-25 MED ORDER — BACITRACIN ZINC 500 UNIT/GM EX OINT
TOPICAL_OINTMENT | CUTANEOUS | Status: AC
  Filled 2022-10-25: qty 28.35

## 2022-10-25 MED ORDER — ATORVASTATIN CALCIUM 10 MG PO TABS
20.0000 mg | ORAL_TABLET | Freq: Every day | ORAL | Status: DC
  Administered 2022-10-25 – 2022-10-31 (×7): 20 mg via ORAL
  Filled 2022-10-25 (×6): qty 2

## 2022-10-25 MED ORDER — AMISULPRIDE (ANTIEMETIC) 5 MG/2ML IV SOLN: 10.0000 mg | Freq: Once | INTRAVENOUS | Status: DC | PRN

## 2022-10-25 MED ORDER — FENTANYL CITRATE (PF) 100 MCG/2ML IJ SOLN: 50.0000 ug | Freq: Once | INTRAMUSCULAR | Status: AC

## 2022-10-25 MED ORDER — ACETAMINOPHEN 500 MG PO TABS
1000.0000 mg | ORAL_TABLET | Freq: Once | ORAL | Status: AC
  Administered 2022-10-25: 1000 mg via ORAL
  Filled 2022-10-25: qty 2

## 2022-10-25 MED ORDER — AMIODARONE HCL 200 MG PO TABS
200.0000 mg | ORAL_TABLET | Freq: Two times a day (BID) | ORAL | Status: DC
  Administered 2022-10-25 – 2022-10-31 (×13): 200 mg via ORAL
  Filled 2022-10-25 (×12): qty 1

## 2022-10-25 MED ORDER — PANTOPRAZOLE SODIUM 40 MG PO TBEC
40.0000 mg | DELAYED_RELEASE_TABLET | Freq: Every day | ORAL | Status: DC
  Administered 2022-10-25 – 2022-10-31 (×7): 40 mg via ORAL
  Filled 2022-10-25 (×7): qty 1

## 2022-10-25 MED ORDER — PHENYLEPHRINE HCL-NACL 20-0.9 MG/250ML-% IV SOLN
INTRAVENOUS | Status: DC | PRN
  Administered 2022-10-25 (×2): 50 ug/min via INTRAVENOUS

## 2022-10-25 MED ORDER — CEFAZOLIN SODIUM-DEXTROSE 2-4 GM/100ML-% IV SOLN
2.0000 g | Freq: Three times a day (TID) | INTRAVENOUS | Status: AC
  Administered 2022-10-25 – 2022-10-26 (×2): 2 g via INTRAVENOUS
  Filled 2022-10-25 (×2): qty 100

## 2022-10-25 MED ORDER — PROPOFOL 500 MG/50ML IV EMUL
INTRAVENOUS | Status: DC | PRN
  Administered 2022-10-25 (×2): 50 ug/kg/min via INTRAVENOUS

## 2022-10-25 MED ORDER — SENNOSIDES-DOCUSATE SODIUM 8.6-50 MG PO TABS
1.0000 | ORAL_TABLET | Freq: Two times a day (BID) | ORAL | Status: DC
  Administered 2022-10-25 – 2022-10-28 (×7): 1 via ORAL
  Filled 2022-10-25 (×7): qty 1

## 2022-10-25 MED ORDER — MIDODRINE HCL 5 MG PO TABS
10.0000 mg | ORAL_TABLET | Freq: Three times a day (TID) | ORAL | Status: DC
  Administered 2022-10-25 – 2022-10-28 (×8): 10 mg via ORAL
  Filled 2022-10-25 (×7): qty 2

## 2022-10-25 MED ORDER — PROPOFOL 10 MG/ML IV BOLUS
INTRAVENOUS | Status: DC | PRN
  Administered 2022-10-25: 20 mg via INTRAVENOUS

## 2022-10-25 MED ORDER — 0.9 % SODIUM CHLORIDE (POUR BTL) OPTIME
TOPICAL | Status: DC | PRN
  Administered 2022-10-25: 1000 mL

## 2022-10-25 MED ORDER — CHLORHEXIDINE GLUCONATE 0.12 % MT SOLN
OROMUCOSAL | Status: AC
  Administered 2022-10-25: 15 mL
  Filled 2022-10-25: qty 15

## 2022-10-25 MED ORDER — ACETAMINOPHEN 10 MG/ML IV SOLN: 1000.0000 mg | Freq: Once | INTRAVENOUS | Status: DC | PRN

## 2022-10-25 MED ORDER — HEPARIN (PORCINE) 25000 UT/250ML-% IV SOLN
1550.0000 [IU]/h | INTRAVENOUS | Status: DC
  Administered 2022-10-25 – 2022-10-28 (×4): 1550 [IU]/h via INTRAVENOUS
  Filled 2022-10-25 (×4): qty 250

## 2022-10-25 MED ORDER — FENTANYL CITRATE (PF) 100 MCG/2ML IJ SOLN
INTRAMUSCULAR | Status: AC
  Administered 2022-10-25: 50 ug via INTRAVENOUS
  Filled 2022-10-25: qty 2

## 2022-10-25 MED ORDER — CHLORHEXIDINE GLUCONATE 0.12 % MT SOLN: 15.0000 mL | Freq: Once | OROMUCOSAL | Status: AC

## 2022-10-25 SURGICAL SUPPLY — 38 items
BAG COUNTER SPONGE SURGICOUNT (BAG) ×1 IMPLANT
BAG SPNG CNTER NS LX DISP (BAG)
BLADE AVERAGE 25X9 (BLADE) IMPLANT
BNDG CMPR 5X4 KNIT ELC UNQ LF (GAUZE/BANDAGES/DRESSINGS) ×1
BNDG ELASTIC 4INX 5YD STR LF (GAUZE/BANDAGES/DRESSINGS) IMPLANT
BNDG ELASTIC 4X5.8 VLCR STR LF (GAUZE/BANDAGES/DRESSINGS) ×1 IMPLANT
BNDG GAUZE DERMACEA FLUFF 4 (GAUZE/BANDAGES/DRESSINGS) ×1 IMPLANT
BNDG GZE 12X3 1 PLY HI ABS (GAUZE/BANDAGES/DRESSINGS)
BNDG GZE DERMACEA 4 6PLY (GAUZE/BANDAGES/DRESSINGS) ×1
BNDG STRETCH GAUZE 3IN X12FT (GAUZE/BANDAGES/DRESSINGS) ×1 IMPLANT
CANISTER SUCT 3000ML PPV (MISCELLANEOUS) ×1 IMPLANT
COVER SURGICAL LIGHT HANDLE (MISCELLANEOUS) ×1 IMPLANT
DRAPE HALF SHEET 40X57 (DRAPES) ×1 IMPLANT
DRAPE ORTHO SPLIT 77X108 STRL (DRAPES) ×2
DRAPE SURG ORHT 6 SPLT 77X108 (DRAPES) ×2 IMPLANT
DRSG ADAPTIC 3X8 NADH LF (GAUZE/BANDAGES/DRESSINGS) IMPLANT
ELECT REM PT RETURN 9FT ADLT (ELECTROSURGICAL) ×1
ELECTRODE REM PT RTRN 9FT ADLT (ELECTROSURGICAL) ×1 IMPLANT
GAUZE SPONGE 4X4 12PLY STRL (GAUZE/BANDAGES/DRESSINGS) ×1 IMPLANT
GLOVE BIO SURGEON STRL SZ7.5 (GLOVE) ×1 IMPLANT
GLOVE BIOGEL PI IND STRL 8 (GLOVE) ×1 IMPLANT
GLOVE SURG POLY ORTHO LF SZ7.5 (GLOVE) IMPLANT
GLOVE SURG UNDER LTX SZ8 (GLOVE) ×1 IMPLANT
GOWN STRL REUS W/ TWL LRG LVL3 (GOWN DISPOSABLE) ×3 IMPLANT
GOWN STRL REUS W/TWL LRG LVL3 (GOWN DISPOSABLE) ×3
KIT BASIN OR (CUSTOM PROCEDURE TRAY) ×1 IMPLANT
KIT TURNOVER KIT B (KITS) ×1 IMPLANT
NS IRRIG 1000ML POUR BTL (IV SOLUTION) ×1 IMPLANT
PACK GENERAL/GYN (CUSTOM PROCEDURE TRAY) ×1 IMPLANT
PAD ARMBOARD 7.5X6 YLW CONV (MISCELLANEOUS) ×2 IMPLANT
STAPLER VISISTAT 35W (STAPLE) IMPLANT
SUT ETHILON 3 0 PS 1 (SUTURE) ×1 IMPLANT
SUT VIC AB 3-0 SH 27 (SUTURE) ×2
SUT VIC AB 3-0 SH 27X BRD (SUTURE) IMPLANT
TOWEL GREEN STERILE (TOWEL DISPOSABLE) ×2 IMPLANT
TOWEL GREEN STERILE FF (TOWEL DISPOSABLE) ×1 IMPLANT
UNDERPAD 30X36 HEAVY ABSORB (UNDERPADS AND DIAPERS) ×1 IMPLANT
WATER STERILE IRR 1000ML POUR (IV SOLUTION) ×1 IMPLANT

## 2022-10-25 NOTE — Progress Notes (Addendum)
ANTICOAGULATION CONSULT NOTE  Pharmacy Consult for heparin Indication: critical limb ischemia, hx afib/DVT  Allergies  Allergen Reactions   Plavix [Clopidogrel Bisulfate]     Brain hemorrhage prev while on plavix and aspirin    Patient Measurements: Height: 5\' 9"  (175.3 cm) Weight: 68.5 kg (151 lb 0.2 oz) IBW/kg (Calculated) : 70.7 Heparin Dosing Weight: 68 kg  Vital Signs: Temp: 97.4 F (36.3 C) (07/02 0802) Temp Source: Oral (07/02 0802) BP: 126/48 (07/02 0700) Pulse Rate: 54 (07/02 0700)  Labs: Recent Labs    10/23/22 0124 10/24/22 0426 10/25/22 0517  HGB 10.1* 10.0* 9.6*  HCT 33.2* 33.3* 32.1*  PLT 316 335 336  HEPARINUNFRC 0.35 0.39 0.42  CREATININE 1.48*  --  1.54*     Estimated Creatinine Clearance: 36.4 mL/min (A) (by C-G formula based on SCr of 1.54 mg/dL (H)).   Assessment: 62 yom with hx afib/DVT s/p IVC filter on warfarin PTA presenting with critical limb ischemia. He is now s/p endarterectomy/bypass on 6/25 and plans are for transmetatarsal amputation on 7/2.  Pharmacy dosing heparin.  Heparin level therapeutic at 0.42, CBC stable.  Goal of Therapy:  Heparin level 0.3-0.7 units/ml Monitor platelets by anticoagulation protocol: Yes   Plan:  Continue heparin 1550 units/h Will follow plans post OR  Harland German, PharmD Clinical Pharmacist **Pharmacist phone directory can now be found on [email or web address] ([email or web address]).  Listed under [facility].   Addendum He is s/p transmetatarsal amputation and plans are to restart heparin 8 hrs post OR  Plan -Restart heparin 1550 units/hr at 9pm -Heparin level in 8 hours and daily wth CBC daily  Harland German, PharmD Clinical Pharmacist **Pharmacist phone directory can now be found on [email or web address] ([email or web address]).  Listed under [name].

## 2022-10-25 NOTE — Transfer of Care (Signed)
Immediate Anesthesia Transfer of Care Note  Patient: [name]  Procedure(s) Performed: RIGHT TRANSMETATARSAL AMPUTATION (Right: Foot)  Patient Location: PACU  Anesthesia Type:MAC  Level of Consciousness: awake, alert , oriented, and patient cooperative  Airway & Oxygen Therapy: Patient Spontanous Breathing  Post-op Assessment: Report given to RN, Post -op Vital signs reviewed and stable, and Patient moving all extremities X 4  Post vital signs: Reviewed and stable  Last Vitals:  Vitals Value Taken Time  BP 104/38 10/25/22 1230  Temp 36.8 C 10/25/22 1224  Pulse 52 10/25/22 1232  Resp 20 10/25/22 1232  SpO2 94 % 10/25/22 1232  Vitals shown include unvalidated device data.  Last Pain:  Vitals:   10/25/22 0919  TempSrc:   PainSc: 0-No pain      Patients Stated Pain Goal: 0 (10/24/22 1600)  Complications: No notable events documented.

## 2022-10-25 NOTE — Anesthesia Postprocedure Evaluation (Signed)
Anesthesia Post Note  Patient: [name]  Procedure(s) Performed: RIGHT TRANSMETATARSAL AMPUTATION (Right: Foot)     Patient location during evaluation: PACU Anesthesia Type: Regional and General Level of consciousness: awake and alert Pain management: pain level controlled Vital Signs Assessment: post-procedure vital signs reviewed and stable Respiratory status: spontaneous breathing and respiratory function stable Cardiovascular status: stable Postop Assessment: no apparent nausea or vomiting Anesthetic complications: no  No notable events documented.  Last Vitals:  Vitals:   10/25/22 1245 10/25/22 1254  BP: (!) 121/41 (!) 117/48  Pulse: (!) 50 (!) 48  Resp: (!) 21 17  Temp:  36.6 C  SpO2: 93% 93%    Last Pain:  Vitals:   10/25/22 1254  TempSrc:   PainSc: 0-No pain                 Naren Benally DANIEL

## 2022-10-25 NOTE — Anesthesia Procedure Notes (Signed)
Procedure Name: MAC Date/Time: 10/25/2022 11:34 AM  Performed by: Marquis Buggy, CRNAPre-anesthesia Checklist: Patient identified, Emergency Drugs available, Timeout performed, Suction available and Patient being monitored Patient Re-evaluated:Patient Re-evaluated prior to induction Oxygen Delivery Method: Simple face mask Preoxygenation: Pre-oxygenation with 100% oxygen Induction Type: IV induction

## 2022-10-25 NOTE — Anesthesia Preprocedure Evaluation (Addendum)
Anesthesia Evaluation  Patient identified by MRN, date of birth, ID band Patient awake    Reviewed: Allergy & Precautions, H&P , NPO status , Patient's Chart, lab work & pertinent test results  History of Anesthesia Complications Negative for: history of anesthetic complications  Airway Mallampati: I  TM Distance: >3 FB Neck ROM: Full    Dental  (+) Edentulous Upper, Edentulous Lower   Pulmonary COPD, former smoker   Pulmonary exam normal        Cardiovascular Exercise Tolerance: Poor hypertension, + CAD, + CABG, + Peripheral Vascular Disease and +CHF  Normal cardiovascular exam+ dysrhythmias Atrial Fibrillation + Valvular Problems/Murmurs AS   ECHO 05/23/2022: IMPRESSIONS   1. Left ventricular ejection fraction, by estimation, is 40 to 45%. The  left ventricle has mildly decreased function.   2. Right ventricular systolic function is normal. The right ventricular  size is normal.   3. Left atrial size was moderately dilated. No left atrial/left atrial  appendage thrombus was detected.   4. Right atrial size was mildly dilated.   5. The mitral valve is normal in structure. Mild mitral valve  regurgitation.   6. Visually AS appears severe but by gradient measures in mild to  moderate range. Unable to planimeter AoV orifcie as it was very eccentric.  The aortic valve is tricuspid. There is severe calcifcation of the aortic  valve. Aortic valve regurgitation is  trivial. Moderate to severe aortic valve stenosis. Aortic valve mean  gradient measures 13.3 mmHg. Aortic valve Vmax measures 2.26 m/s.   7. There is Severe (Grade IV) plaque involving the descending aorta.   8. Successful DC-CV    Subclavian artery stenosis, left s/p stent  02/2021 Cath: LM 100, RCA 90ost, 128m, free LIMA->LAD nl, VG->OM2 nl, VG->RPDA nl. Mod AS (mean grad , AVA 1.1cm^2). RHC w/ elev filling pressures.  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 06/2011 Echo: EF 35-40%; b. 06/2012 Echo: EF 50%; c.               12/2016 Echo: EF 55-60%, no rwma, GRI DD, mild AS/MR, nl               RV fxn, nl RVSP; d. 02/2021 Echo: EF 55%, GrII DD. Mild               MR. Mild to mod TR. Sev AS by VTI. No date: AAA (abdominal aortic aneurysm) (HCC)     Comment:  a. 05/2020 Abd u/s: 3.6cm. 05/01/2013: Aortic calcification (HCC) No date: Bilateral renal artery stenosis (HCC)     Comment:  a. 06/2013 Angio/PTA: LRA: 95 (5x18 Herculink stent), RRA              60ost; b. 04/2021 Angio: mod RRA stenosis w/ patent LRA               stent. No date: Carotid arterial disease (HCC)     Comment:  a. 05/2020 Carotid U/S: RICA 1-39%, LICA 40-59% No date: CHF (congestive heart failure) (HCC) No date: Chronic kidney disease  No date: COPD (chronic obstructive pulmonary disease) (HCC) No date: Coronary artery disease     Comment:  a. 2013 s/p CABG x 3 (LIMA->LAD, VG->OM, VG->RPDA; b.               06/2012 MV: no ischemia; c. 02/2021 Cath: LM 100, RCA               90ost, 111m, free LIMA->LAD nl, VG->OM2 nl, VG->RPDA nl.               Mod AS (mean grad , AVA 1.1cm^2). RHC w/ elev               filling pressures. 04/11/2008: History of prior cigarette smoking     Comment:  Qualifier: Diagnosis of  By: Ermalene Searing MD, Amy   No date: Hyperlipidemia No date: Hypertension No date: Hypothyroidism 07/12/2012: Intraventricular hemorrhage (HCC) No date: Ischemic cardiomyopathy     Comment:  a.  06/2011 Echo: EF 35-40%; b. 06/2012 Echo: EF 50%; c.               12/2016 Echo: EF 55-60%, no rwma, GRI DD; d. 02/2021 Echo:              Ef 55%, GrII DD. No date: Moderate aortic stenosis     Comment:  a. 02/2021 Echo: EF 55%, GrII DD. Sev Ca2+ of AoV. Sev               AS w/ AVA by VTI 0.79cm^2; b. 02/2021 Cath: Mod AS w/               mean grad . AVA 1.1cm^2. No date: PAF (paroxysmal atrial fibrillation) (HCC) No date: Peripheral arterial disease (HCC)     Comment:  a. Previous left lower extremity stenting by Dr. Evette Cristal;              b. 12/2012 s/p bilat ostial common iliac stenting; c.               04/2021 Angio: Patent LRA stent, mod RRA stenosis. Small               AAA. Sev plaque RSFA 2/ subtl occl of inf branch of               profunda. Diff Ca2+ SFA dzs w/ collats from profunda and               3V runoff. Diffuse LSFA dzs w/ collats from profunda.               Subtl PT dzs. 3V runoff-->Med Rx. 08/13/2012: Right leg DVT (HCC) 05/2012: Subclavian artery stenosis, left (HCC)     Comment:  Status post stenting of the ostium and self-expanding               stent placement to the left axillary artery No date: Venous insufficiency  Past Surgical History: 07/10/2013: ABDOMINAL ANGIOGRAM     Comment:  WITH BI-FEMORAL RUNOFF       DR Kirke Corin 12/26/2012: ABDOMINAL AORTAGRAM; N/A     Comment:  Procedure: ABDOMINAL AORTAGRAM;  Surgeon: [name], MD;  Location: [facility];  Service:               Cardiovascular;  Laterality: N/A; 07/10/2013: ABDOMINAL AORTAGRAM; N/A     Comment:  Procedure: ABDOMINAL AORTAGRAM;  Surgeon: Chelsea Aus  Kirke Corin, MD;  Location: [facility];  Service:               Cardiovascular;  Laterality: N/A; 05/12/2021: ABDOMINAL AORTOGRAM W/LOWER EXTREMITY; N/A     Comment:  Procedure: ABDOMINAL AORTOGRAM W/LOWER EXTREMITY;                Surgeon: [name], MD;  Location: [facility] INVASIVE CV              LAB;  Service:  Cardiovascular;  Laterality: N/A; 05/2012: ANGIOPLASTY / STENTING FEMORAL 12/26/2012: ANGIOPLASTY / STENTING ILIAC; Bilateral 06/20/2012: ARCH AORTOGRAM; N/A     Comment:  Procedure: ARCH AORTOGRAM;  Surgeon: [name],               MD;  Location: [facility];  Service: Cardiovascular;                Laterality: N/A; 05/2012: CARDIAC CATHETERIZATION     Comment:  [facility] 1990's: CHOLECYSTECTOMY 07/11/2011: CORONARY ARTERY BYPASS GRAFT     Comment:  Procedure: CORONARY ARTERY BYPASS GRAFTING (CABG);                Surgeon: [name], MD;  Location: [facility] OR;                Service: Open Heart Surgery;  Laterality: N/A;  Times 3.               On Pump. Using right greater saphenous vein and left               internal mammary artery.  No date: CORONARY ARTERY BYPASS GRAFT No date: HERNIA REPAIR 12/26/2012: INSERTION OF ILIAC STENT; Bilateral     Comment:  Procedure: INSERTION OF ILIAC STENT;  Surgeon: [name], MD;  Location: [facility];  Service:               Cardiovascular;  Laterality: Bilateral;  Bilateral Common              Iliac Artery 07/07/2011: LEFT AND RIGHT HEART CATHETERIZATION WITH CORONARY  ANGIOGRAM     Comment:  Procedure: LEFT AND RIGHT HEART CATHETERIZATION WITH               CORONARY ANGIOGRAM;  Surgeon: [name], MD;                Location: [facility];  Service: Cardiovascular;; 12/30/2016: LOWER EXTREMITY VENOGRAPHY; Bilateral     Comment:  Procedure: Lower Extremity Venography;  Surgeon:               Renford Dills, MD;  Location: [facility] INVASIVE CV LAB;               Service: Cardiovascular;  Laterality: Bilateral; 06/20/2012: PERCUTANEOUS STENT INTERVENTION     Comment:  Procedure: PERCUTANEOUS STENT INTERVENTION;  Surgeon:               [name], MD;  Location: [facility];  Service:               Cardiovascular;; 07/10/2013: RENAL ARTERY ANGIOPLASTY; Left     Comment:  DR Kirke Corin 03/15/2021: RIGHT/LEFT  HEART CATH AND CORONARY/GRAFT ANGIOGRAPHY; N/A     Comment:  Procedure: RIGHT/LEFT HEART CATH AND CORONARY/GRAFT               ANGIOGRAPHY;  Surgeon: [name], MD;  Location:               [facility] INVASIVE CV LAB;  Service: Cardiovascular;                Laterality: N/A; 05/2012: SUBCLAVIAN ARTERY STENT     Comment:  "2 stents" (12/26/2012) Spring 2017: TOOTH EXTRACTION     Comment:  mulitple (6) 07/2012: VENA CAVA FILTER PLACEMENT     Comment:  Removable     Reproductive/Obstetrics negative OB ROS                             Anesthesia Physical Anesthesia Plan  ASA: 4  Anesthesia Plan: MAC and Regional   Post-op Pain Management: Tylenol PO (pre-op)* and Regional block*   Induction: Intravenous  PONV Risk Score and Plan: 2 and Ondansetron and Dexamethasone  Airway Management Planned: Natural Airway and Simple Face Mask  Additional Equipment:   Intra-op Plan:   Post-operative Plan:   Informed Consent: I have reviewed the patients History and Physical, chart, labs and discussed the procedure including the risks, benefits and alternatives for the proposed anesthesia with the patient or authorized representative who has indicated his/her understanding and acceptance.     Dental advisory given  Plan Discussed with: Anesthesiologist and CRNA  Anesthesia Plan Comments:         Anesthesia Quick Evaluation

## 2022-10-25 NOTE — Anesthesia Procedure Notes (Signed)
Anesthesia Regional Block: Popliteal block   Pre-Anesthetic Checklist: , timeout performed,  Correct Patient, Correct Site, Correct Laterality,  Correct Procedure, Correct Position, site marked,  Risks and benefits discussed,  Surgical consent,  Pre-op evaluation,  At surgeon's request and post-op pain management  Laterality: Right  Prep: chloraprep       Needles:  Injection technique: Single-shot  Needle Type: Echogenic Stimulator Needle          Additional Needles:   Procedures:,,,, ultrasound used (permanent image in chart),,    Narrative:  Start time: 10/25/2022 9:27 AM End time: 10/25/2022 9:37 AM Injection made incrementally with aspirations every 5 mL.  Performed by: Personally  Anesthesiologist: Heather Roberts, MD  Additional Notes: A functioning IV was confirmed and monitors were applied.  Sterile prep and drape, hand hygiene and sterile gloves were used.  Negative aspiration and test dose prior to incremental administration of local anesthetic. The patient tolerated the procedure well.Ultrasound  guidance: relevant anatomy identified, needle position confirmed, local anesthetic spread visualized around nerve(s), vascular puncture avoided.  Image printed for medical record.

## 2022-10-25 NOTE — TOC Initial Note (Signed)
Transition of Care [facility]) - Initial/Assessment Note    Patient Details  Name: [name] MRN: [id number] Date of Birth: 16-Nov-1940  Transition of Care [facility]) CM/SW Contact:    Delilah Shan, LCSWA Phone Number: 10/25/2022, 4:27 PM  Clinical Narrative:                  CSW received consult for possible SNF placement at time of discharge. CSW spoke with patient and patients spouse regarding PT recommendation of SNF placement at time of discharge. PTA patient reports he comes from home with spouse.Patient expressed understanding of PT recommendation and is agreeable to SNF placement at time of discharge. Patient gave CSW permission to fax out initial referral near the [name] area for possible SNF placement CSW discussed insurance authorization process and will provide Medicare SNF ratings list with accepted SNF bed offers when available.  No further questions reported at this time. CSW to continue to follow and assist with discharge planning needs.   Expected Discharge Plan: Skilled Nursing Facility Barriers to Discharge: Continued Medical Work up   Patient Goals and CMS Choice Patient states their goals for this hospitalization and ongoing recovery are:: SNF   Choice offered to / list presented to : Patient, Spouse      Expected Discharge Plan and Services In-house Referral: Clinical Social Work Discharge Planning Services: CM Consult Post Acute Care Choice: Home Health Living arrangements for the past 2 months: Single Family Home                             HH Agency: [facility]        Prior Living Arrangements/Services Living arrangements for the past 2 months: Single Family Home Lives with:: Spouse Patient language and need for interpreter reviewed:: Yes Do you feel safe going back to the place where you live?: No   SNF  Need for Family Participation in Patient Care: Yes (Comment) Care giver support system in place?: Yes  (comment)   Criminal Activity/Legal Involvement Pertinent to Current Situation/Hospitalization: No - Comment as needed  Activities of Daily Living Home Assistive Devices/Equipment: Walker (specify type) ADL Screening (condition at time of admission) Patient's cognitive ability adequate to safely complete daily activities?: Yes Is the patient deaf or have difficulty hearing?: No Does the patient have difficulty seeing, even when wearing glasses/contacts?: No Does the patient have difficulty concentrating, remembering, or making decisions?: Yes Patient able to express need for assistance with ADLs?: Yes Does the patient have difficulty dressing or bathing?: Yes Independently performs ADLs?: Yes (appropriate for developmental age) Does the patient have difficulty walking or climbing stairs?: Yes Weakness of Legs: Both Weakness of Arms/Hands: Both  Permission Sought/Granted Permission sought to share information with : Case Manager, Family Supports, Oceanographer granted to share information with : Yes, Verbal Permission Granted  Share Information with NAME: Margert  Permission granted to share info w AGENCY: SNF  Permission granted to share info w Relationship: Spouse  Permission granted to share info w Contact Information: [name] [phone]  Emotional Assessment Appearance:: Appears stated age Attitude/Demeanor/Rapport: Gracious Affect (typically observed): Calm Orientation: : Oriented to Self, Oriented to Place, Oriented to  Time, Oriented to Situation Alcohol / Substance Use: Not Applicable Psych Involvement: No (comment)  Admission diagnosis:  Limb ischemia [I99.8] Claudication in peripheral vascular disease [facility]) [I73.9] Patient Active Problem List   Diagnosis Date Noted   Critical limb ischemia of  right lower extremity with ulceration of foot (HCC) 10/11/2022   Limb ischemia 10/11/2022   Heart failure with preserved ejection fraction (HCC)  03/23/2022   History of deep vein thrombosis (DVT) of lower extremity 03/23/2022   Pneumothorax 02/06/2022   Venous stasis ulcer of left ankle limited to breakdown of skin with varicose veins (HCC) 09/22/2021   Aortic valve stenosis    Long term (current) use of anticoagulants 01/27/2017   DVT (deep venous thrombosis) (HCC) 12/29/2016   PAF (paroxysmal atrial fibrillation) (HCC) 05/24/2013   Aortic calcification (HCC) 05/01/2013   CKD (chronic kidney disease) stage 3, GFR 30-59 ml/min (HCC) 05/01/2013   Bilateral renal artery stenosis (HCC)    HTN (hypertension) 07/15/2012   Intraventricular hemorrhage (HCC) 07/12/2012   Peripheral arterial disease (HCC)    Ischemic cardiomyopathy 07/08/2011   CAD (coronary artery disease), native coronary artery 07/07/2011   Subclavian arterial stenosis (HCC) 04/25/2011   BPH (benign prostatic hyperplasia) 12/29/2010   COPD (chronic obstructive pulmonary disease) (HCC) 01/08/2010   Abdominal aortic aneurysm (HCC) 11/06/2009   HYPERCHOLESTEROLEMIA 10/09/2009   History of prior cigarette smoking 04/11/2008   Hypothyroidism 09/07/2006   PCP:  [name], MD Pharmacy:   [facility] - [place], [place] - [address] [place] [address] Phone: [phone] Fax: [phone]  [facility] [address] [place] - [address] [place] [address] Phone: [phone] Fax: [phone]     Social Determinants of Health (SDOH) Social History: SDOH Screenings   Food Insecurity: No Food Insecurity (10/14/2022)  Housing: Low Risk  (10/14/2022)  Transportation Needs: No Transportation Needs (10/14/2022)  Utilities: Not At Risk (10/14/2022)  Alcohol Screen: Low Risk  (07/13/2022)  Depression (PHQ2-9): Low Risk  (07/13/2022)  Financial Resource Strain: Low Risk  (07/13/2022)  Physical Activity: Insufficiently Active (07/13/2022)  Social Connections: Moderately Integrated (07/13/2022)  Stress: No Stress  Concern Present (07/13/2022)  Tobacco Use: Medium Risk (10/25/2022)   SDOH Interventions:     Readmission Risk Interventions     No data to display

## 2022-10-25 NOTE — Op Note (Signed)
    NAME: HUGO NIEDZWIECKI    MRN: [id number] DOB: Mar 05, 1941    DATE OF OPERATION: 10/25/2022  PREOP DIAGNOSIS:    Gangrene right foot  POSTOP DIAGNOSIS:    Same  PROCEDURE:    Right transmetatarsal amputation  SURGEON: Di Kindle. [name], MD  ASSIST: Karrie [place], RNFA  ANESTHESIA: Block  EBL: 100 cc  INDICATIONS:    EMYR PAPALEO is a 82 y.o. male who presented with gangrene of the right forefoot and peripheral arterial disease.  He underwent revascularization.  He presents now for transmetatarsal amputation  FINDINGS:   Excellent bleeding at the site of the amputation.  TECHNIQUE:   The patient was taken to the operating room and a block had been placed by anesthesia.  The right foot was prepped and draped in usual sterile fashion.  A fishmouth incision was marked encompassing the forefoot.  The dissection was carried down through the skin subcutaneous tissue and fascia to the metatarsal heads.  Each were dissected free circumferentially and then divided with a CD4 saw.  The forefoot was then removed.  Hemostasis was obtained using electrocautery.  The wound was then closed with interrupted 4-0 Vicryl's and the skin closed with staples.  A sterile dressing was applied.  The patient tolerated the procedure well was transferred to the recovery room in stable condition.  All needle and sponge counts were correct.  Given the complexity of the case a first assistant was necessary in order to expedient the procedure and safely perform the technical aspects of the operation.  Waverly Ferrari, MD, FACS [name]  DATE OF DICTATION:   10/25/2022

## 2022-10-25 NOTE — Progress Notes (Addendum)
Progress Note   10/25/2022 6:50 AM 7 Days Post-Op  Subjective:  says he is ready to go home.  Ready for surgery.   Afebrile  Vitals:   10/25/22 0530 10/25/22 0600  BP:  (!) 135/59  Pulse: (!) 58 (!) 53  Resp: (!) 24 (!) 23  Temp:    SpO2: 93% 93%   Physical Exam: General:  no distress; wakes easily Cardiac:  regular Lungs:  non labored Incisions:  right groin and right below knee incisions look fine Extremities:  brisk biphasic doppler flow right DP/PT; toes gangrenous right foot.  CBC    Component Value Date/Time   WBC 11.1 (H) 10/25/2022 0517   RBC 3.63 (L) 10/25/2022 0517   HGB 9.6 (L) 10/25/2022 0517   HGB 14.4 05/06/2021 0953   HCT 32.1 (L) 10/25/2022 0517   HCT 42.6 05/06/2021 0953   PLT 336 10/25/2022 0517   PLT 199 05/06/2021 0953   MCV 88.4 10/25/2022 0517   MCV 87 05/06/2021 0953   MCV 87 06/03/2013 0454   MCH 26.4 10/25/2022 0517   MCHC 29.9 (L) 10/25/2022 0517   RDW 19.5 (H) 10/25/2022 0517   RDW 13.6 05/06/2021 0953   RDW 14.2 06/03/2013 0454   LYMPHSABS 1.1 10/11/2022 1056   LYMPHSABS 1.8 05/06/2021 0953   LYMPHSABS 2.7 07/28/2012 0513   MONOABS 0.9 10/11/2022 1056   MONOABS 1.4 (H) 07/28/2012 0513   EOSABS 0.2 10/11/2022 1056   EOSABS 0.3 05/06/2021 0953   EOSABS 0.3 07/28/2012 0513   BASOSABS 0.1 10/11/2022 1056   BASOSABS 0.1 05/06/2021 0953   BASOSABS 1 06/03/2013 0454   BASOSABS 0.1 07/28/2012 0513    BMET    Component Value Date/Time   NA 136 10/25/2022 0517   NA 142 05/06/2021 0953   NA 136 06/03/2013 0454   K 3.5 10/25/2022 0517   K 4.1 06/03/2013 0454   CL 106 10/25/2022 0517   CL 106 06/03/2013 0454   CO2 22 10/25/2022 0517   CO2 27 06/03/2013 0454   GLUCOSE 92 10/25/2022 0517   GLUCOSE 80 06/03/2013 0454   BUN 21 10/25/2022 0517   BUN 22 05/06/2021 0953   BUN 22 (H) 06/03/2013 0454   CREATININE 1.54 (H) 10/25/2022 0517   CREATININE 1.59 (H) 06/03/2013 0454   CREATININE 1.02 04/22/2011 1507   CALCIUM 7.9 (L)  10/25/2022 0517   CALCIUM 8.8 06/03/2013 0454   GFRNONAA 45 (L) 10/25/2022 0517   GFRNONAA 43 (L) 06/03/2013 0454   GFRAA 59 (L) 12/30/2016 0103   GFRAA 50 (L) 06/03/2013 0454   INR    Component Value Date/Time   INR 1.7 (H) 10/14/2022 0617   INR 1.3 06/02/2013 0746    Intake/Output Summary (Last 24 hours) at 10/25/2022 0650 Last data filed at 10/25/2022 0600 Gross per 24 hour  Intake 744.19 ml  Output 1050 ml  Net -305.81 ml     Assessment/Plan:  82 y.o. male is s/p:  Right iliofemoral endarterectomy and right femoral popliteal bypass with PTFE 10/18/2022 by Dr. [name]   7 Days Post-Op  -pt continues to have brisk doppler flow right DP/PT.  Incisions look good.   -pt continues to be of neo.  Plans for OR today for right TMA.  Hold heparin on call to OR (nursing communication written yesterday).  He knows not to eat or drink anything.  Despite excellent doppler signals at the level of the ankle and proximal foot, he is at significant risk of non healing given his  small vessel disease.  -DVT prophylaxis:  heparin gtt.    Doreatha Massed, PA-C [facility] [phone] 10/25/2022 6:50 AM  I have interviewed the patient and examined the patient. I agree with the findings by the PA.  He is ready for surgery today.  He remains off pressors.  His Coumadin has been held and he is on IV heparin.  I will stop this at 11 AM prior to surgery.  Cari Caraway, MD 7:51 AM

## 2022-10-25 NOTE — Progress Notes (Signed)
NAME:  [name], MRN:  [id number], DOB:  [birth year], LOS: 14 ADMISSION DATE:  10/11/2022, CONSULTATION DATE:  10/19/2022 REFERRING MD:  Nelson Chimes - TRH, CHIEF COMPLAINT: Ventilator management  History of Present Illness:  82 year old man with extensive past medical history significant for but not limited to HFrEF, AAA, bilateral renal artery stenosis, CKD stage IIIb, COPD, CAD, HTN, HLD, PAF with history of DVT anticoagulated with Coumadin and IVC filter in place, PAD, and, subclavian artery stenosis who initially presented to the emergency department 6/18 for worsening right foot ulcer pain and discoloration.  He was admitted per [facility] [place] with vascular surgery consultation.  Patient underwent abdominal aortogram with lower extremity with Dr. Durwin Nora 6/21 that revealed significant distal disease.   6/25 patient underwent multiple right lower extremity endarterectomies with patch placement per vascular.  Patient remained on pressors postprocedure and was transferred to ICU, PCCM consulted for ventilator management.  Pertinent Medical History:  HFrEF, AAA, bilateral renal artery stenosis, CKD stage IIIb, COPD, CAD, HTN, HLD, PAF with history of DVT anticoagulated with Coumadin and IVC filter in place, PAD, and, subclavian artery stenosis   Significant Hospital Events: Including procedures, antibiotic start and stop dates in addition to other pertinent events   6/18 admitted right foot gangrene 6/19 INR elevated, Coumadin held and vitamin K given per surgery 6/20 Zosyn started, amiodarone, Lopressor, and Lasix on hold 6/21 abdominal aortogram with lower extremity runoff consistent with significant distal disease 6/25 underwent multiple right lower extremity endarterectomies with patch placement per vascular 6/26 remains in the ICU for pressor dependent, PCCM consulted 6/27 no acute issues overnight, remains on low-dose vasopressors peripherally 7/2 - Remains off of pressors. Taken to OR for R  transmetatarsal amputation [name]).  Interim History / Subjective:  No significant overnight events POD#0 from R transmetatarsal amputation Tolerated well, no pain at present Good appetite On RA, remains off of pressors  Objective   Blood pressure (!) 126/48, pulse (!) 54, temperature 98.1 F (36.7 C), resp. rate 20, height 5\' 9"  (1.753 m), weight 68.5 kg, SpO2 94 %.        Intake/Output Summary (Last 24 hours) at 10/25/2022 0734 Last data filed at 10/25/2022 0600 Gross per 24 hour  Intake 744.19 ml  Output 1050 ml  Net -305.81 ml    Filed Weights   10/23/22 0600 10/24/22 0600 10/25/22 0500  Weight: 69.2 kg 68 kg 68.5 kg   Physical Examination: General: Chronically ill-appearing elderly man in NAD. Pleasant and conversant. HEENT: [place]/AT, anicteric sclera, PERRL, moist mucous membranes. Edentulous. Neuro: Awake, oriented x 4. Responds to verbal stimuli. Following commands consistently. Moves all 4 extremities spontaneously. CV: Bradycardic, regular rhythm, no m/g/r. PULM: Breathing even and unlabored on RA. Lung fields CTAB. GI: Soft, nontender, nondistended. Normoactive bowel sounds. Extremities: Unilateral RLE 1+ pitting edema noted, unchanged from prior exam. R foot with OR dressing in place. Skin: Warm/dry, no rashes.  Resolved Hospital Problem List:    Assessment & Plan:  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 Extensive peripheral arterial disease - Management per VVS, appreciate assistance - POD#0 from R transmetatarsal amputation 7/2 - Heparin gtt - ASA/statin - Multimodal pain control with oxycodone, Lyrica, adjunct medications  Abdominal aortic aneurysm- 5.5 cm on recent imaging, enlarged per prior image - Management per VVS - Tight BP control - ASA/statin  Shock-mixed septic and hypovolemic, resolved - Goal MAP > 65 - Fluid resuscitation as tolerated, caution in the setting of HFrEF - Remains off of vasopressors -  Continue  midodrine - Continue broad-spectrum antibiotics (Zosyn)  Paroxysmal atrial fibrillation anticoagulated with Coumadin PTA. Failed DCCV January 2024. History of DVT Paroxysmal Afib - Continue amiodarone PO - Cardiac monitoring - Optimize electrolytes for K > 4, Mg > 2 - Heparin gtt for [facility], given continued need for procedures - Hold Coumadin  Chronic HFrEF; has had recovery of ejection fraction to 55 to 60% on most recent Echo Severe aortic valve stenosis - Volume optimization as able - Outpatient close Cardiology f/u for severe AS, not clinically stable/safe for surgical intervention at this time given gangrene  CKD stage IIIa - Trend BMP - Replete electrolytes as indicated - Monitor I&Os - Avoid nephrotoxic agents as able - Ensure adequate renal perfusion  COPD, not acutely exacerbated - Supplemental O2 support as needed - Wean O2 for sat 88-92% - Bronchodilators PRN - Pulmonary hygiene  Hypothyroidism - Continue levothyroxine  Anemia - Trend H&H - Monitor for signs of active bleeding - Transfuse for Hgb < 7.0 or hemodynamically significant bleeding  Signature:   Tim Lair, PA-C [facility] 10/25/22 7:34 AM  Please see [email or web address] for pager details.  From 7A-7P if no response, please call [phone] After hours, please call ELink [phone]

## 2022-10-26 ENCOUNTER — Encounter (HOSPITAL_COMMUNITY): Payer: Self-pay | Admitting: Vascular Surgery

## 2022-10-26 DIAGNOSIS — D62 Acute posthemorrhagic anemia: Secondary | ICD-10-CM

## 2022-10-26 LAB — CBC
HCT: 31.9 % — ABNORMAL LOW (ref 39.0–52.0)
Hemoglobin: 9.6 g/dL — ABNORMAL LOW (ref 13.0–17.0)
MCH: 26.7 pg (ref 26.0–34.0)
MCHC: 30.1 g/dL (ref 30.0–36.0)
MCV: 88.6 fL (ref 80.0–100.0)
Platelets: 300 10*3/uL (ref 150–400)
RBC: 3.6 MIL/uL — ABNORMAL LOW (ref 4.22–5.81)
RDW: 19.9 % — ABNORMAL HIGH (ref 11.5–15.5)
WBC: 13.3 10*3/uL — ABNORMAL HIGH (ref 4.0–10.5)
nRBC: 0.3 % — ABNORMAL HIGH (ref 0.0–0.2)

## 2022-10-26 LAB — HEPARIN LEVEL (UNFRACTIONATED): Heparin Unfractionated: 0.38 IU/mL (ref 0.30–0.70)

## 2022-10-26 LAB — BASIC METABOLIC PANEL
Anion gap: 10 (ref 5–15)
BUN: 17 mg/dL (ref 8–23)
CO2: 21 mmol/L — ABNORMAL LOW (ref 22–32)
Calcium: 8 mg/dL — ABNORMAL LOW (ref 8.9–10.3)
Chloride: 106 mmol/L (ref 98–111)
Creatinine, Ser: 1.4 mg/dL — ABNORMAL HIGH (ref 0.61–1.24)
GFR, Estimated: 50 mL/min — ABNORMAL LOW (ref >60)
Glucose, Bld: 114 mg/dL — ABNORMAL HIGH (ref 70–99)
Potassium: 3.7 mmol/L (ref 3.5–5.1)
Sodium: 137 mmol/L (ref 135–145)

## 2022-10-26 LAB — PROTIME-INR
INR: 1.4 — ABNORMAL HIGH (ref 0.8–1.2)
Prothrombin Time: 17.1 seconds — ABNORMAL HIGH (ref 11.4–15.2)

## 2022-10-26 MED ORDER — WARFARIN - PHARMACIST DOSING INPATIENT: Freq: Every day | Status: DC

## 2022-10-26 MED ORDER — POTASSIUM CHLORIDE CRYS ER 20 MEQ PO TBCR
40.0000 meq | EXTENDED_RELEASE_TABLET | Freq: Once | ORAL | Status: AC
  Administered 2022-10-26: 40 meq via ORAL
  Filled 2022-10-26: qty 2

## 2022-10-26 MED ORDER — WARFARIN SODIUM 2.5 MG PO TABS
2.5000 mg | ORAL_TABLET | Freq: Once | ORAL | Status: AC
  Administered 2022-10-26: 2.5 mg via ORAL
  Filled 2022-10-26: qty 1

## 2022-10-26 NOTE — Progress Notes (Signed)
ANTICOAGULATION CONSULT NOTE  Pharmacy Consult for heparin Indication: critical limb ischemia, hx afib/DVT  Allergies  Allergen Reactions   Plavix [Clopidogrel Bisulfate]     Brain hemorrhage prev while on plavix and aspirin    Patient Measurements: Height: 5\' 9"  (175.3 cm) Weight: 68.2 kg (150 lb 5.7 oz) IBW/kg (Calculated) : 70.7 Heparin Dosing Weight: 68 kg  Vital Signs: Temp: 97.7 F (36.5 C) (07/03 0337) Temp Source: Axillary (07/03 0337) BP: 101/54 (07/03 0000) Pulse Rate: 57 (07/03 0000)  Labs: Recent Labs    10/24/22 0426 10/25/22 0517 10/26/22 0611  HGB 10.0* 9.6* 9.6*  HCT 33.3* 32.1* 31.9*  PLT 335 336 300  LABPROT  --   --  17.1*  INR  --   --  1.4*  HEPARINUNFRC 0.39 0.42 0.38  CREATININE  --  1.54*  --      Estimated Creatinine Clearance: 36.3 mL/min (A) (by C-G formula based on SCr of 1.54 mg/dL (H)).   Assessment: 56 yom with hx afib/DVT s/p IVC filter on warfarin PTA presenting with critical limb ischemia. He is now s/p endarterectomy/bypass on 6/25 and plans are for transmetatarsal amputation on 7/2.  Pharmacy dosing heparin.  Heparin level therapeutic at 0.38, CBC stable.  Goal of Therapy:  Heparin level 0.3-0.7 units/ml Monitor platelets by anticoagulation protocol: Yes   Plan:  Continue heparin 1550 units/h Will follow plans post OR  Harland German, PharmD Clinical Pharmacist **Pharmacist phone directory can now be found on [email or web address] ([email or web address]).  Listed under [facility].   Addendum He is s/p transmetatarsal amputation and plans are to restart heparin 8 hrs post OR  Plan Continue heparin at 1550u/hr. Patient has had several days of therapeutic readings prior to be held for OR.  Daily heparin levels.   Estill Batten, PharmD, BCCCP  Clinical Pharmacist **Pharmacist phone directory can now be found on [email or web address] ([email or web address]).  Listed under [facility].

## 2022-10-26 NOTE — Progress Notes (Signed)
NAME:  [name], MRN:  [id number], DOB:  1940-05-05, LOS: 15 ADMISSION DATE:  10/11/2022, CONSULTATION DATE:  10/19/2022 REFERRING MD:  Nelson Chimes - TRH, CHIEF COMPLAINT: Ventilator management  History of Present Illness:  82 year old man with extensive past medical history significant for but not limited to HFrEF, AAA, bilateral renal artery stenosis, CKD stage IIIb, COPD, CAD, HTN, HLD, PAF with history of DVT anticoagulated with Coumadin and IVC filter in place, PAD, and, subclavian artery stenosis who initially presented to the emergency department 6/18 for worsening right foot ulcer pain and discoloration.  He was admitted per [facility] with vascular surgery consultation.  Patient underwent abdominal aortogram with lower extremity with Dr. Durwin Nora 6/21 that revealed significant distal disease.   6/25 patient underwent multiple right lower extremity endarterectomies with patch placement per vascular.  Patient remained on pressors postprocedure and was transferred to ICU, PCCM consulted for ventilator management.  Pertinent Medical History:  HFrEF, AAA, bilateral renal artery stenosis, CKD stage IIIb, COPD, CAD, HTN, HLD, PAF with history of DVT anticoagulated with Coumadin and IVC filter in place, PAD, and, subclavian artery stenosis   Significant Hospital Events: Including procedures, antibiotic start and stop dates in addition to other pertinent events   6/18 admitted right foot gangrene 6/19 INR elevated, Coumadin held and vitamin K given per surgery 6/20 Zosyn started, amiodarone, Lopressor, and Lasix on hold 6/21 abdominal aortogram with lower extremity runoff consistent with significant distal disease 6/25 underwent multiple right lower extremity endarterectomies with patch placement per vascular 6/26 remains in the ICU for pressor dependent, PCCM consulted 6/27 no acute issues overnight, remains on low-dose vasopressors peripherally 7/2 - Remains off of pressors. Taken to OR for R  transmetatarsal amputation [name]).  Interim History / Subjective:  No significant events overnight POD#1 from R TMA, dressing changed by VVS Remains off of pressors, MAPs borderline but wide pulse pressure likely r/t AV pathology Mentation is good, appetite is good, progressing appropriately Mild nausea today, no vomiting Likely stable for transfer out of ICU today  Objective:  Blood pressure (!) 130/47, pulse 64, temperature 97.7 F (36.5 C), temperature source Axillary, resp. rate 19, height 5\' 9"  (1.753 m), weight 68.2 kg, SpO2 93 %.        Intake/Output Summary (Last 24 hours) at 10/26/2022 0711 Last data filed at 10/26/2022 0600 Gross per 24 hour  Intake 800.57 ml  Output 700 ml  Net 100.57 ml    Filed Weights   10/25/22 0500 10/25/22 0904 10/26/22 0600  Weight: 68.5 kg 68.5 kg 68.2 kg   Physical Examination: General: Acutely ill-appearing elderly man in NAD. Pleasant and conversant. HEENT: [place]/AT, anicteric sclera, PERRL, moist mucous membranes. Neuro: Awake, oriented x 4. Responds to verbal stimuli. Following commands consistently. Moves all 4 extremities spontaneously. CV: RRR, no m/g/r. PULM: Breathing even and unlabored on RA. Lung fields CTAB in upper fields, slightly diminished at bilateral bases. GI: Soft, nontender, nondistended. Normoactive bowel sounds. Extremities: Unilateral R-sided 1+ LE edema noted, no erythema. R medial thigh with moderately firm focal swelling, suspect seroma. R foot with dressing in place s/p TMA. Skin: Warm/dry, no rashes.  Resolved Hospital Problem List:    Assessment & Plan:  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 Extensive peripheral arterial disease - Management per VVS, appreciate assistance - POD#1 from R TMA - Heparin gtt as a bridge to warfarin, can resume slowly per VVS - ASA/statin - Multimodal postop pain control (oxycodone, Lyrica, adjuncts) - Resume PT with  postop considerations in  place  Abdominal aortic aneurysm- 5.5 cm on recent imaging, enlarged per prior image - Management per VVS - Tight BP control - ASA/statin  Shock-mixed septic and hypovolemic, resolved - Goal MAP > 65 or SBP > 100 (wide pulse pressure) - Appears euvolemic at present - No longer requiring pressors for > 24H - Continue midodrine - Trend WBC, fever curve - Continue broad-spectrum abx (Zosyn)  Paroxysmal atrial fibrillation anticoagulated with Coumadin PTA. Failed DCCV January 2024. History of DVT Paroxysmal Afib - Continue amiodarone PO - Cardiac monitoring - Optimize electrolytes for K > 4, Mg > 2 - Heparin gtt as bridge to warfarin, slow initiation per VVS; pharmacy consulted, appreciate recs  Chronic HFrEF; has had recovery of ejection fraction to 55 to 60% on most recent Echo Severe aortic valve stenosis - Volume optimization as able - Outpatient Cardiology f/u for AS, needs to recover from current hospitalization prior to consideration of intervention  CKD stage IIIa - Trend BMP - Replete electrolytes as indicated - Monitor I&Os - Avoid nephrotoxic agents as able - Ensure adequate renal perfusion  COPD, not acutely exacerbated - Supplemental O2 support as needed  - Wean O2 for sat 88-92%, on RA at present - Bronchodilators PRN - Pulmonary hygiene  Nausea without vomiting - Antiemetics PRN - ADAT slowly - Escalate bowel regimen as needed  Hypothyroidism - Continue levothyroxine  Anemia - Trend H&H - Monitor for signs of active bleeding - Transfuse for Hgb < 7.0 or hemodynamically significant bleeding  Progressing appropriately postoperatively, stable for transfer out of ICU today.  Signature:   Faythe Ghee [facility] 10/26/22 7:11 AM  Please see [email or web address] for pager details.  From 7A-7P if no response, please call [phone] After hours, please call ELink [phone]

## 2022-10-26 NOTE — Progress Notes (Signed)
[facility] ADULT ICU REPLACEMENT PROTOCOL   The patient does apply for the [facility] Adult ICU Electrolyte Replacment Protocol based on the criteria listed below:   1.Exclusion criteria: TCTS, ECMO, Dialysis, and Myasthenia Gravis patients 2. Is GFR >/= 30 ml/min? Yes.    Patient's GFR today is 45 3. Is SCr </= 2? Yes.   Patient's SCr is 1.54 mg/dL 4. Did SCr increase >/= 0.5 in 24 hours? No. 5.Pt's weight >40kg  Yes.   6. Abnormal electrolyte(s):   K 3.5  7. Electrolytes replaced per protocol 8.  Call MD STAT for K+ </= 2.5, Phos </= 1, or Mag </= 1 Physician:  Shawn Stall R [facility] 10/26/2022 5:32 AM

## 2022-10-26 NOTE — Progress Notes (Signed)
Orthopedic Tech Progress Note Patient Details:  YOCHANAN DEMPEWOLF 20-May-1940 [id number] Applied DARCO boot per order.  Ortho Devices Type of Ortho Device: Darco shoe Ortho Device/Splint Location: RLE Ortho Device/Splint Interventions: Ordered, Application, Adjustment   Post Interventions Patient Tolerated: Well Instructions Provided: Adjustment of device, Care of device  Blase Mess 10/26/2022, 10:50 AM

## 2022-10-26 NOTE — Progress Notes (Signed)
ANTICOAGULATION CONSULT NOTE  Pharmacy Consult for heparin Indication: critical limb ischemia, hx afib/DVT  Allergies  Allergen Reactions   Plavix [Clopidogrel Bisulfate]     Brain hemorrhage prev while on plavix and aspirin    Patient Measurements: Height: 5\' 9"  (175.3 cm) Weight: 68.2 kg (150 lb 5.7 oz) IBW/kg (Calculated) : 70.7 Heparin Dosing Weight: 68 kg  Vital Signs: Temp: 98 F (36.7 C) (07/03 0700) Temp Source: Axillary (07/03 0337) BP: 131/53 (07/03 0900) Pulse Rate: 57 (07/03 0900)  Labs: Recent Labs    10/24/22 0426 10/25/22 0517 10/26/22 0611  HGB 10.0* 9.6* 9.6*  HCT 33.3* 32.1* 31.9*  PLT 335 336 300  LABPROT  --   --  17.1*  INR  --   --  1.4*  HEPARINUNFRC 0.39 0.42 0.38  CREATININE  --  1.54* 1.40*     Estimated Creatinine Clearance: 39.9 mL/min (A) (by C-G formula based on SCr of 1.4 mg/dL (H)).   Assessment: 38 yom with hx afib/DVT s/p IVC filter on warfarin PTA presenting with critical limb ischemia. He is now s/p endarterectomy/bypass on 6/25 and plans are for transmetatarsal amputation on 7/2.  Pharmacy dosing heparin and restarting warfarin today (slow resumption per VVS) -INR= 1.4, Hg= 10  Warfarin PTA: 2.5mg /d except take none on Sat   Goal of Therapy:  Heparin level 0.3-0.7 units/ml Monitor platelets by anticoagulation protocol: Yes   Plan:  Continue heparin 1550 units/h Daily heparin level and CBC Warfarin 2.5mg  today Daily INR  Harland German, PharmD Clinical Pharmacist **Pharmacist phone directory can now be found on [email or web address] ([email or web address]).  Listed under [facility].

## 2022-10-26 NOTE — Progress Notes (Signed)
Physical Therapy RE- EVAL Patient Details Name: [name] MRN: [id number] DOB: 28-Jul-1940 Today's Date: 10/26/2022   History of Present Illness 82 yo m referred to ED 6/18 by physician with critical limb ischemia. He is now s/p endarterectomy/bypass on 6/25, s/p R foot transmet amputation on 7/2. 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    PT Comments  Pt underwent R foot transmet amputation on 7/2. Pt with R LE pain, further deconditioning, and overall weakness as anticipated. Pt however did progress quickly with his ability to stand. Initially pt required maxAX2 but then transitioned to modAx2. Darco shoe on R LE t/o treatment. Pt with good understanding on how to don it. Educated on wearing his sneaker on the L foot to even out leg length. Pt remains appropriate for inpatient therapy < 3 hrs/day to achieve safe supervision level of function for safe transition home with spouse.     Assistance Recommended at Discharge Frequent or constant Supervision/Assistance  If plan is discharge home, recommend the following:  Can travel by private vehicle    A lot of help with walking and/or transfers;A lot of help with bathing/dressing/bathroom;Assistance with cooking/housework;Direct supervision/assist for medications management;Direct supervision/assist for financial management;Assist for transportation;Help with stairs or ramp for entrance   No  Equipment Recommendations  None recommended by PT    Recommendations for Other Services       Precautions / Restrictions Precautions Precautions: Fall Precaution Comments: watch SpO2, hold breath Required Braces or Orthoses: Other Brace Other Brace: Darco  shoe Restrictions Weight Bearing Restrictions: Yes RLE Weight Bearing: Partial weight bearing RLE Partial Weight Bearing Percentage or Pounds: with Darco shoe on     Mobility  Bed Mobility Overal bed mobility: Needs Assistance Bed Mobility: Rolling, Sidelying to Sit Rolling: Min assist Sidelying to sit: Mod assist       General bed mobility comments: modA for R LE management, modA for trunk elevation, increased time    Transfers Overall transfer level: Needs assistance Equipment used: Rolling walker (2 wheels) Transfers: Sit to/from Stand Sit to Stand: Max assist, +2 physical assistance (initially maxAX2, progressed to light modAx2 from chair)   Step pivot transfers: Max assist, +2 physical assistance, +2 safety/equipment, From elevated surface       General transfer comment: completed 5 sit to stands. took 3 sit to stand to complete full upright posture, maxA to power up and stedy due to posterior bias. L shoe donned to equal leg lengths with R shoe in darco shoe, on 5th stand from chair, pt with good power up using arm rests requiring minAx2 initially, modAx2 to stedy during transition of hands    Ambulation/Gait               General Gait Details: limited to step pvt to chair this date due to pt off balance with R darco shoe, fatigue, and anxiety causing SOB, RN present an placed pt on 2LO2 via [place] due to SPO2 in 70s   Stairs             Wheelchair Mobility     Tilt Bed    Modified Rankin (Stroke Patients Only)       Balance Overall balance assessment: Needs assistance Sitting-balance support: Feet supported, Bilateral upper extremity  supported Sitting balance-Leahy Scale: Poor Sitting balance - Comments: pt with R lateral lean this date   Standing balance support: During functional activity, Reliant on assistive device for balance, Bilateral upper extremity supported Standing balance-Leahy Scale: Poor Standing balance comment: dependent on  external assist                            Cognition Arousal/Alertness: Awake/alert Behavior During Therapy: Anxious Overall Cognitive Status: Within Functional Limits for tasks assessed                                 General Comments: pt with anxiety with movement. pt gives great effort but holds his breath and then demos DOE/SOB and SpO2 dec into 70s        Exercises      General Comments General comments (skin integrity, edema, etc.): SpO2 dec into 70s on room air during mobility      Pertinent Vitals/Pain Pain Assessment Pain Assessment: Faces Faces Pain Scale: Hurts even more Pain Location: R foot Pain Descriptors / Indicators: Discomfort Pain Intervention(s): Monitored during session    Home Living                          Prior Function            PT Goals (current goals can now be found in the care plan section) Acute Rehab PT Goals PT Goal Formulation: With patient/family Time For Goal Achievement: 11/02/22 Potential to Achieve Goals: Good Progress towards PT goals: Progressing toward goals    Frequency    Min 1X/week      PT Plan Current plan remains appropriate    Co-evaluation              AM-PAC PT "6 Clicks" Mobility   Outcome Measure  Help needed turning from your back to your side while in a flat bed without using bedrails?: A Lot Help needed moving from lying on your back to sitting on the side of a flat bed without using bedrails?: A Lot Help needed moving to and from a bed to a chair (including a wheelchair)?: A Lot Help needed standing up from a chair using your arms (e.g., wheelchair or bedside chair)?: A Lot Help needed to walk in hospital room?: Total Help needed climbing 3-5 steps with a railing? : Total 6 Click Score: 10    End of Session Equipment Utilized During Treatment: Gait belt;Other (comment) (darco shoe on R LE) Activity Tolerance: Patient limited by fatigue Patient left: in  chair;with call bell/phone within reach;with chair alarm set (OT present) Nurse Communication: Mobility status PT Visit Diagnosis: Unsteadiness on feet (R26.81);Other abnormalities of gait and mobility (R26.89);Muscle weakness (generalized) (M62.81);Difficulty in walking, not elsewhere classified (R26.2);History of falling (Z91.81);Repeated falls (R29.6);Pain Pain - Right/Left: Right Pain - part of body: Leg     Time: 8119-1478 PT Time Calculation (min) (ACUTE ONLY): 53 min  Charges:    $Gait Training: 8-22 mins $Therapeutic Activity: 23-37 mins PT General Charges $$ ACUTE PT VISIT: 1 Visit                     Lewis Shock, PT, DPT Acute Rehabilitation Services Secure chat preferred Office #: [phone]    Iona Hansen 10/26/2022, 12:13 PM

## 2022-10-26 NOTE — TOC Progression Note (Signed)
Transition of Care [facility]) - Progression Note    Patient Details  Name: [name] MRN: [id number] Date of Birth: 10-01-1940  [facility] [address] [address] CM/SW Contact  Delilah Shan, LCSWA Phone Number: 10/26/2022, 5:11 PM  Clinical Narrative:     CSW faxed out patient for SNF placement. CSW will continue to follow and assist with patients dc planning needs.  Expected Discharge Plan: Skilled Nursing Facility Barriers to Discharge: Continued Medical Work up  Expected Discharge Plan and Services In-house Referral: Clinical Social Work Discharge Planning Services: CM Consult Post Acute Care Choice: Home Health Living arrangements for the past 2 months: Single Family Home                             HH Agency: [facility]         Social Determinants of Health (SDOH) Interventions SDOH Screenings   Food Insecurity: No Food Insecurity (10/14/2022)  Housing: Low Risk  (10/14/2022)  Transportation Needs: No Transportation Needs (10/14/2022)  Utilities: Not At Risk (10/14/2022)  Alcohol Screen: Low Risk  (07/13/2022)  Depression (PHQ2-9): Low Risk  (07/13/2022)  Financial Resource Strain: Low Risk  (07/13/2022)  Physical Activity: Insufficiently Active (07/13/2022)  Social Connections: Moderately Integrated (07/13/2022)  Stress: No Stress Concern Present (07/13/2022)  Tobacco Use: Medium Risk (10/26/2022)    Readmission Risk Interventions     No data to display

## 2022-10-26 NOTE — [place] FL2 (Signed)
[place] MEDICAID FL2 LEVEL OF CARE FORM     IDENTIFICATION  Patient Name: [name] Birthdate: 1940-07-24 Sex: male Admission Date (Current Location): 10/11/2022  [facility] and [place] Number:  Producer, television/film/video and Address:  [facility] [place], [address] [place], [place] [address]      Provider Number: [id number]  Attending Physician Name and Address:  Steffanie Dunn, DO  Relative Name and Phone Number:  [name] (Spouse) [phone]    Current Level of Care: Hospital Recommended Level of Care: Skilled Nursing Facility Prior Approval Number:    Date Approved/Denied:   PASRR Number: [id number]  Discharge Plan: SNF    Current Diagnoses: Patient Active Problem List   Diagnosis Date Noted   Critical limb ischemia of right lower extremity with ulceration of foot (HCC) 10/11/2022   Limb ischemia 10/11/2022   Heart failure with preserved ejection fraction (HCC) 03/23/2022   History of deep vein thrombosis (DVT) of lower extremity 03/23/2022   Pneumothorax 02/06/2022   Venous stasis ulcer of left ankle limited to breakdown of skin with varicose veins (HCC) 09/22/2021   Aortic valve stenosis    Long term (current) use of anticoagulants 01/27/2017   DVT (deep venous thrombosis) (HCC) 12/29/2016   PAF (paroxysmal atrial fibrillation) (HCC) 05/24/2013   Aortic calcification (HCC) 05/01/2013   CKD (chronic kidney disease) stage 3, GFR 30-59 ml/min (HCC) 05/01/2013   Bilateral renal artery stenosis (HCC)    HTN (hypertension) 07/15/2012   Intraventricular hemorrhage (HCC) 07/12/2012   Peripheral arterial disease (HCC)    Ischemic cardiomyopathy 07/08/2011   CAD (coronary artery disease), native coronary artery 07/07/2011   Subclavian arterial stenosis (HCC) 04/25/2011   BPH (benign prostatic hyperplasia) 12/29/2010   COPD (chronic obstructive pulmonary disease) (HCC) 01/08/2010   Abdominal aortic aneurysm (HCC) 11/06/2009    HYPERCHOLESTEROLEMIA 10/09/2009   History of prior cigarette smoking 04/11/2008   Hypothyroidism 09/07/2006    Orientation RESPIRATION BLADDER Height & Weight     Self, Time, Situation, Place  Normal Continent (Urethral Catheter) Weight: 150 lb 5.7 oz (68.2 kg) Height:  5\' 9"  (175.3 cm)  BEHAVIORAL SYMPTOMS/MOOD NEUROLOGICAL BOWEL NUTRITION STATUS      Continent Diet (see discharge summary)  AMBULATORY STATUS COMMUNICATION OF NEEDS Skin   Extensive Assist Verbally Other (Comment) (Wound/Incision LDAs,Incision Closed,Leg,R,Incision Closed Leg,R,Incision Closed,Foot,R,Wound/Incision open or dehisced non-pressure wound,anterior,R,toe,Black,Wound/Incision non-pressure wound,coccyx,medial,Please see additional info)                       Personal Care Assistance Level of Assistance  Bathing, Dressing, Feeding Bathing Assistance: Maximum assistance Feeding assistance: Independent Dressing Assistance: Maximum assistance     Functional Limitations Info  Sight, Hearing, Speech   Hearing Info: Adequate Speech Info: Adequate    SPECIAL CARE FACTORS FREQUENCY  PT (By licensed PT), OT (By licensed OT)     PT Frequency: 5x min weekly OT Frequency: 5x min weekly            Contractures Contractures Info: Not present    Additional Factors Info  Code Status, Allergies Code Status Info: FULL Allergies Info: Plavix (clopidogrel Bisulfate)           Current Medications (10/26/2022):  This is the current hospital active medication list Current Facility-Administered Medications  Medication Dose Route Frequency Provider Last Rate Last Admin   0.9 %  sodium chloride infusion  500 mL Intravenous Once PRN Rhyne, Samantha J, PA-C       acetaminophen (  TYLENOL) tablet 325-650 mg  325-650 mg Oral Q4H PRN Dara Lords, PA-C   650 mg at 10/25/22 1946   Or   acetaminophen (TYLENOL) suppository 325-650 mg  325-650 mg Rectal Q4H PRN Rhyne, Samantha J, PA-C       albuterol (PROVENTIL)  (2.5 MG/3ML) 0.083% nebulizer solution 2.5 mg  2.5 mg Inhalation Q6H PRN Rhyne, Samantha J, PA-C       alum & mag hydroxide-simeth (MAALOX/MYLANTA) 200-200-20 MG/5ML suspension 15-30 mL  15-30 mL Oral Q2H PRN Rhyne, Samantha J, PA-C       amiodarone (PACERONE) tablet 200 mg  200 mg Oral BID Olalere, Adewale A, MD   200 mg at 10/26/22 0925   aspirin EC tablet 81 mg  81 mg Oral Q0600 Dara Lords, PA-C   81 mg at 10/26/22 0536   atorvastatin (LIPITOR) tablet 20 mg  20 mg Oral Daily Olalere, Adewale A, MD   20 mg at 10/26/22 1610   bisacodyl (DULCOLAX) suppository 10 mg  10 mg Rectal Daily PRN Rhyne, Samantha J, PA-C       Chlorhexidine Gluconate Cloth 2 % PADS 6 each  6 each Topical Daily Rhyne, Ames Coupe, PA-C   6 each at 10/26/22 1500   feeding supplement (BOOST / RESOURCE BREEZE) liquid 1 Container  1 Container Oral TID BM Rhyne, Samantha J, PA-C   1 Container at 10/26/22 1331   Gerhardt's butt cream 1 Application  1 Application Topical BID Rhyne, Ames Coupe, PA-C   1 Application at 10/26/22 1000   guaiFENesin-dextromethorphan (ROBITUSSIN DM) 100-10 MG/5ML syrup 15 mL  15 mL Oral Q4H PRN Rhyne, Samantha J, PA-C       heparin ADULT infusion 100 units/mL (25000 units/262mL)  1,550 Units/hr Intravenous Continuous Olalere, Adewale A, MD 15.5 mL/hr at 10/26/22 1600 1,550 Units/hr at 10/26/22 1600   hydrALAZINE (APRESOLINE) injection 5 mg  5 mg Intravenous Q20 Min PRN Rhyne, Samantha J, PA-C       HYDROmorphone (DILAUDID) injection 0.5 mg  0.5 mg Intravenous Q2H PRN Rhyne, Samantha J, PA-C   0.5 mg at 10/20/22 1945   labetalol (NORMODYNE) injection 10 mg  10 mg Intravenous Q10 min PRN Rhyne, Samantha J, PA-C       levothyroxine (SYNTHROID) tablet 150 mcg  150 mcg Oral Q0600 Dara Lords, PA-C   150 mcg at 10/26/22 0536   magnesium sulfate IVPB 2 g 50 mL  2 g Intravenous Daily PRN Rhyne, Samantha J, PA-C       midodrine (PROAMATINE) tablet 10 mg  10 mg Oral TID WC Olalere, Adewale A, MD   10 mg  at 10/26/22 1330   ondansetron (ZOFRAN) injection 4 mg  4 mg Intravenous Q6H PRN Rhyne, Samantha J, PA-C       oxyCODONE (Oxy IR/ROXICODONE) immediate release tablet 5 mg  5 mg Oral Q6H PRN Rhyne, Samantha J, PA-C   5 mg at 10/26/22 0536   pantoprazole (PROTONIX) EC tablet 40 mg  40 mg Oral Daily Olalere, Adewale A, MD   40 mg at 10/26/22 0925   phenol (CHLORASEPTIC) mouth spray 1 spray  1 spray Mouth/Throat PRN Rhyne, Samantha J, PA-C       piperacillin-tazobactam (ZOSYN) IVPB 3.375 g  3.375 g Intravenous Q8H Chuck Hint, MD 12.5 mL/hr at 10/26/22 1600 Infusion Verify at 10/26/22 1600   polyethylene glycol (MIRALAX / GLYCOLAX) packet 17 g  17 g Oral Daily Olalere, Adewale A, MD       pregabalin (  LYRICA) capsule 50 mg  50 mg Oral Daily Rhyne, Samantha J, PA-C   50 mg at 10/26/22 8295   senna-docusate (Senokot-S) tablet 1 tablet  1 tablet Oral BID Virl Diamond A, MD   1 tablet at 10/26/22 6213   warfarin (COUMADIN) tablet 2.5 mg  2.5 mg Oral ONCE-1600 Silvana Newness, [facility]       Warfarin - Pharmacist Dosing Inpatient   Does not apply [id number] Silvana Newness, [facility]       Facility-Administered Medications Ordered in Other Encounters  Medication Dose Route Frequency Provider Last Rate Last Admin   sodium chloride flush (NS) 0.9 % injection 3 mL  3 mL Intravenous Q12H Furth, Cadence H, PA-C         Discharge Medications: Please see discharge summary for a list of discharge medications.  Relevant Imaging Results:  Relevant Lab Results:   Additional Information [id number],Wound/Incision open or dehisced,heel L,Wound/Incision open or dehisced, heel,R  Delilah Shan, LCSWA

## 2022-10-26 NOTE — Progress Notes (Signed)
  VASCULAR SURGERY ASSESSMENT & PLAN:   POD 1 RIGHT TMA: His TMA looks good. Dressing changed. Begin daily dressing changes. Excellent doppler signals right foot. Groin incision looks good.   OK to transfer out of unit from our standpoint  OK to resume Coumadin slowly.  OK to resume PTx. PWB right foot, heel only with Darco shoe.  SUBJECTIVE:   Pain well controlled.   PHYSICAL EXAM:   Vitals:   10/25/22 2345 10/26/22 0000 10/26/22 0337 10/26/22 0600  BP:  (!) 101/54    Pulse:  (!) 57    Resp:  (!) 24    Temp: 98.3 F (36.8 C)  97.7 F (36.5 C)   TempSrc: Oral  Axillary   SpO2:  93%    Weight:    68.2 kg  Height:       Right TMA looks good. Dressing changed.  Brisk DP and PT with doppler. Right groin incision looks fine.   LABS:   Lab Results  Component Value Date   WBC 13.3 (H) 10/26/2022   HGB 9.6 (L) 10/26/2022   HCT 31.9 (L) 10/26/2022   MCV 88.6 10/26/2022   PLT 300 10/26/2022   Lab Results  Component Value Date   CREATININE 1.54 (H) 10/25/2022    PROBLEM LIST:    Principal Problem:   Critical limb ischemia of right lower extremity with ulceration of foot (HCC) Active Problems:   Hypothyroidism   Abdominal aortic aneurysm (HCC)   COPD (chronic obstructive pulmonary disease) (HCC)   Ischemic cardiomyopathy   Peripheral arterial disease (HCC)   HTN (hypertension)   CKD (chronic kidney disease) stage 3, GFR 30-59 ml/min (HCC)   PAF (paroxysmal atrial fibrillation) (HCC)   DVT (deep venous thrombosis) (HCC)   Aortic valve stenosis   CURRENT MEDS:    amiodarone  200 mg Oral BID   aspirin EC  81 mg Oral Q0600   atorvastatin  20 mg Oral Daily   Chlorhexidine Gluconate Cloth  6 each Topical Daily   feeding supplement  1 Container Oral TID BM   Gerhardt's butt cream  1 Application Topical BID   levothyroxine  150 mcg Oral Q0600   midodrine  10 mg Oral TID WC   pantoprazole  40 mg Oral Daily   polyethylene glycol  17 g Oral Daily   potassium  chloride  40 mEq Oral Once   pregabalin  50 mg Oral Daily   senna-docusate  1 tablet Oral BID    Waverly Ferrari Office: [phone] 10/26/2022

## 2022-10-26 NOTE — Progress Notes (Addendum)
Occupational Therapy Treatment Patient Details Name: [name] MRN: [id number] DOB: 1940/10/08 Today's Date: 10/26/2022   History of present illness 82 yo m referred to ED 6/18 by physician with critical limb ischemia. He is now s/p endarterectomy/bypass on 6/25 and plans are for transmetatarsal amputation on 6/28. 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   OT comments  Pt standing with +2 assist with darco shoe on R foot. No c/o pain in foot, premedicated. Pt completed grooming in sitting. Educated in level 2 theraband exercises for B UEs. Pt needing 4L O2 to maintain SpO2>90%. Needs cues to avoid holding his breath with exertion. Continues to be appropriate for <3 hours a day inpatient therapy upon discharge.    Recommendations for follow up therapy are one component of a multi-disciplinary discharge planning process, led by the attending physician.  Recommendations may be updated based on patient status, additional functional criteria and insurance authorization.    Assistance Recommended at Discharge Frequent or constant Supervision/Assistance  Patient can return home with the following  Two people to help with walking and/or transfers;A lot of help with bathing/dressing/bathroom;Assistance with cooking/housework;Assist for transportation;Help with stairs or ramp for entrance   Equipment Recommendations  None recommended by OT    Recommendations for Other Services      Precautions / Restrictions Precautions Precautions: Fall Precaution Comments: watch SpO2, hold breath Required Braces or Orthoses: Other Brace Other Brace: R darco shoe Restrictions Weight Bearing Restrictions: Yes RLE Weight Bearing:  Partial weight bearing RLE Partial Weight Bearing Percentage or Pounds: through heel in darco shoe       Mobility Bed Mobility                    Transfers Overall transfer level: Needs assistance Equipment used: Rolling walker (2 wheels) Transfers: Sit to/from Stand Sit to Stand: +2 physical assistance, Min assist           General transfer comment: cues for hand placement, assist to rise and steady, heavy reliance on UE, use of momentum     Balance Overall balance assessment: Needs assistance   Sitting balance-Leahy Scale: Fair     Standing balance support: During functional activity, Reliant on assistive device for balance, Bilateral upper extremity supported Standing balance-Leahy Scale: Poor Standing balance comment: able to maintain static standing with RW and min assist to place pillows under buttocks in chair                           ADL either performed or assessed with clinical judgement   ADL Overall ADL's : Needs assistance/impaired     Grooming: Set up;Sitting Grooming Details (indicate cue type and reason): oral care                                    Extremity/Trunk Assessment              Vision       Perception     Praxis      Cognition Arousal/Alertness: Awake/alert Behavior During Therapy: WFL for tasks assessed/performed Overall Cognitive Status: Within Functional Limits for tasks assessed  Exercises Exercises: Other exercises Other Exercises Other Exercises: level 2 theraband exercises 3 x a day B UEs    Shoulder Instructions       General Comments SpO2 dec into 70s on room air during mobility    Pertinent Vitals/ Pain       Pain Assessment Pain Assessment: No/denies pain  Home Living                                          Prior Functioning/Environment              Frequency  Min 1X/week         Progress Toward Goals  OT Goals(current goals can now be found in the care plan section)  Progress towards OT goals: Progressing toward goals  Acute Rehab OT Goals OT Goal Formulation: With patient Time For Goal Achievement: 11/02/22 Potential to Achieve Goals: Good  Plan Discharge plan remains appropriate    Co-evaluation                 AM-PAC OT "6 Clicks" Daily Activity     Outcome Measure   Help from another person eating meals?: None Help from another person taking care of personal grooming?: A Little Help from another person toileting, which includes using toliet, bedpan, or urinal?: Total Help from another person bathing (including washing, rinsing, drying)?: A Lot Help from another person to put on and taking off regular upper body clothing?: A Little Help from another person to put on and taking off regular lower body clothing?: Total 6 Click Score: 14    End of Session Equipment Utilized During Treatment: Gait belt;Rolling walker (2 wheels);Oxygen (4L)  OT Visit Diagnosis: Unsteadiness on feet (R26.81);Other abnormalities of gait and mobility (R26.89);Muscle weakness (generalized) (M62.81);History of falling (Z91.81)   Activity Tolerance Patient tolerated treatment well   Patient Left in chair;with call bell/phone within reach   Nurse Communication          Time: 1610-9604 OT Time Calculation (min): 38 min  Charges: OT General Charges $OT Visit: 1 Visit OT Treatments $Self Care/Home Management : 8-22 mins $Therapeutic Exercise: 8-22 mins  Berna Spare, OTR/L Acute Rehabilitation Services Office: [phone]   Evern Bio 10/26/2022, 12:13 PM

## 2022-10-27 DIAGNOSIS — I70235 Atherosclerosis of native arteries of right leg with ulceration of other part of foot: Secondary | ICD-10-CM | POA: Diagnosis not present

## 2022-10-27 LAB — CBC
HCT: 28.4 % — ABNORMAL LOW (ref 39.0–52.0)
Hemoglobin: 8.7 g/dL — ABNORMAL LOW (ref 13.0–17.0)
MCH: 27.5 pg (ref 26.0–34.0)
MCHC: 30.6 g/dL (ref 30.0–36.0)
MCV: 89.9 fL (ref 80.0–100.0)
Platelets: 244 10*3/uL (ref 150–400)
RBC: 3.16 MIL/uL — ABNORMAL LOW (ref 4.22–5.81)
RDW: 20.1 % — ABNORMAL HIGH (ref 11.5–15.5)
WBC: 11.4 10*3/uL — ABNORMAL HIGH (ref 4.0–10.5)
nRBC: 0.2 % (ref 0.0–0.2)

## 2022-10-27 LAB — MAGNESIUM: Magnesium: 2.2 mg/dL (ref 1.7–2.4)

## 2022-10-27 LAB — PROTIME-INR
INR: 1.5 — ABNORMAL HIGH (ref 0.8–1.2)
Prothrombin Time: 18.4 seconds — ABNORMAL HIGH (ref 11.4–15.2)

## 2022-10-27 LAB — HEPARIN LEVEL (UNFRACTIONATED): Heparin Unfractionated: 0.39 IU/mL (ref 0.30–0.70)

## 2022-10-27 MED ORDER — TAMSULOSIN HCL 0.4 MG PO CAPS
0.4000 mg | ORAL_CAPSULE | Freq: Every day | ORAL | Status: DC
  Administered 2022-10-27 – 2022-10-31 (×5): 0.4 mg via ORAL
  Filled 2022-10-27 (×5): qty 1

## 2022-10-27 MED ORDER — ALBUTEROL SULFATE (2.5 MG/3ML) 0.083% IN NEBU: 2.5000 mg | INHALATION_SOLUTION | Freq: Four times a day (QID) | RESPIRATORY_TRACT | Status: DC | PRN

## 2022-10-27 MED ORDER — ACETAMINOPHEN 325 MG PO TABS
650.0000 mg | ORAL_TABLET | Freq: Four times a day (QID) | ORAL | Status: DC | PRN
  Administered 2022-10-27: 650 mg via ORAL
  Filled 2022-10-27: qty 2

## 2022-10-27 MED ORDER — BETHANECHOL CHLORIDE 10 MG PO TABS
10.0000 mg | ORAL_TABLET | Freq: Three times a day (TID) | ORAL | Status: DC
  Administered 2022-10-27 – 2022-10-29 (×7): 10 mg via ORAL
  Filled 2022-10-27 (×7): qty 1

## 2022-10-27 MED ORDER — WARFARIN SODIUM 2.5 MG PO TABS
2.5000 mg | ORAL_TABLET | Freq: Once | ORAL | Status: AC
  Administered 2022-10-27: 2.5 mg via ORAL
  Filled 2022-10-27: qty 1

## 2022-10-27 NOTE — Progress Notes (Signed)
Physical Therapy Treatment Patient Details Name: [name] MRN: [id number] DOB: 08-21-1940 Today's Date: 10/27/2022   History of Present Illness 82 yo m referred to ED 6/18 by physician with critical limb ischemia. He is now s/p endarterectomy/bypass on 6/25, s/p R foot transmet amputation on 7/2. 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    PT Comments  Returned to assist pt up from chair and back to bed. Pt slouched down in chair and not able to reposition himself adequately. Used Stedy with better standing posture for back to bed. Patient will benefit from continued inpatient follow up therapy, <3 hours/day     Assistance Recommended at Discharge Frequent or constant Supervision/Assistance  If plan is discharge home, recommend the following:  Can travel by private vehicle    A lot of help with bathing/dressing/bathroom;Assistance with cooking/housework;Direct supervision/assist for medications management;Direct supervision/assist for financial management;Assist for transportation;Help with stairs or ramp for entrance;Two people to help with walking and/or transfers   No  Equipment Recommendations  None recommended by PT    Recommendations for Other Services       Precautions / Restrictions Precautions Precautions: Fall Required Braces or Orthoses: Other Brace Other Brace: Darco shoe Restrictions Weight Bearing Restrictions: Yes RLE Weight Bearing: Partial weight bearing RLE Partial Weight Bearing Percentage or Pounds: with Darco shoe on     Mobility  Bed Mobility Overal bed mobility: Needs Assistance Bed Mobility: Sit to Supine Rolling: Min assist Sidelying to sit: Mod assist   Sit to  supine: Mod assist   General bed mobility comments: Assist to lower trunk and bring legs up into bed    Transfers Overall transfer level: Needs assistance Equipment used: Rolling walker (2 wheels) Transfers: Sit to/from Stand, Bed to chair/wheelchair/BSC Sit to Stand: Max assist, +2 physical assistance   Step pivot transfers: Max assist, +2 physical assistance       General transfer comment: Assist to power up and for balance. Transfer via Lift Equipment: Stedy  Ambulation/Gait               General Gait Details: Theme park manager Bed    Modified Rankin (Stroke Patients Only)       Balance Overall balance assessment: Needs assistance Sitting-balance support: Feet supported, Bilateral upper extremity supported Sitting balance-Leahy Scale: Poor Sitting balance - Comments: pt with lt lateral lean this date Postural control: Posterior lean Standing balance support: During functional activity, Reliant on assistive device for balance, Bilateral upper extremity supported Standing balance-Leahy Scale: Poor Standing balance comment: Min assist to maintain static standing with Antony Salmon                            Cognition Arousal/Alertness: Awake/alert Behavior During Therapy: Anxious Overall Cognitive Status: Within Functional Limits for tasks assessed                                          Exercises      General Comments  Pertinent Vitals/Pain Pain Assessment Pain Assessment: Faces Faces Pain Scale: Hurts little more Pain Location: R foot Pain Descriptors / Indicators: Discomfort Pain Intervention(s): Monitored during session    Home Living                          Prior Function            PT Goals (current goals can now be found in the care plan section) Acute Rehab PT Goals Patient Stated Goal: get stronger Progress towards PT goals: Not progressing  toward goals - comment    Frequency    Min 1X/week      PT Plan Current plan remains appropriate    Co-evaluation              AM-PAC PT "6 Clicks" Mobility   Outcome Measure  Help needed turning from your back to your side while in a flat bed without using bedrails?: A Lot Help needed moving from lying on your back to sitting on the side of a flat bed without using bedrails?: A Lot Help needed moving to and from a bed to a chair (including a wheelchair)?: Total Help needed standing up from a chair using your arms (e.g., wheelchair or bedside chair)?: Total Help needed to walk in hospital room?: Total Help needed climbing 3-5 steps with a railing? : Total 6 Click Score: 8    End of Session Equipment Utilized During Treatment: Gait belt;Other (comment) (darco shoe on R LE) Activity Tolerance: Patient limited by fatigue Patient left: with call bell/phone within reach;in bed;with bed alarm set Nurse Communication: Mobility status;Need for lift equipment PT Visit Diagnosis: Unsteadiness on feet (R26.81);Other abnormalities of gait and mobility (R26.89);Muscle weakness (generalized) (M62.81);Difficulty in walking, not elsewhere classified (R26.2);History of falling (Z91.81);Repeated falls (R29.6);Pain Pain - Right/Left: Right Pain - part of body: Leg     Time: 1440-1455 PT Time Calculation (min) (ACUTE ONLY): 15 min  Charges:    $Therapeutic Activity: 8-22 mins PT General Charges $$ ACUTE PT VISIT: 1 Visit                     [facility] PT Acute Rehabilitation Services Office [phone]    Angelina Ok [facility] 10/27/2022, 4:09 PM

## 2022-10-27 NOTE — Progress Notes (Signed)
ANTICOAGULATION CONSULT NOTE  Pharmacy Consult for heparin Indication: critical limb ischemia, hx afib/DVT  Allergies  Allergen Reactions   Plavix [Clopidogrel Bisulfate]     Brain hemorrhage prev while on plavix and aspirin    Patient Measurements: Height: 5\' 9"  (175.3 cm) Weight: 68.2 kg (150 lb 5.7 oz) IBW/kg (Calculated) : 70.7 Heparin Dosing Weight: 68 kg  Vital Signs: Temp: 98.5 F (36.9 C) (07/04 0344) Temp Source: Oral (07/04 0344) BP: 128/55 (07/04 0344) Pulse Rate: 55 (07/04 0344)  Labs: Recent Labs    10/25/22 0517 10/26/22 0611 10/27/22 0544  HGB 9.6* 9.6* 8.7*  HCT 32.1* 31.9* 28.4*  PLT 336 300 244  LABPROT  --  17.1* 18.4*  INR  --  1.4* 1.5*  HEPARINUNFRC 0.42 0.38 0.39  CREATININE 1.54* 1.40*  --      Estimated Creatinine Clearance: 39.9 mL/min (A) (by C-G formula based on SCr of 1.4 mg/dL (H)).   Assessment: 78 yom with hx afib/DVT s/p IVC filter on warfarin PTA presenting with critical limb ischemia. He is now s/p endarterectomy/bypass on 6/25 and plans are for transmetatarsal amputation on 7/2.  Pharmacy dosing heparin and restarting warfarin today (slow resumption per VVS) -INR= 1.4, Hg= 10  Warfarin PTA: 2.5mg /d except take none on Sat   7/4 AM update: HL 0.39 INR 1.5 No signs of bleeding or issues with the heparin gtt   Goal of Therapy:  Heparin level 0.3-0.7 units/ml Monitor platelets by anticoagulation protocol: Yes   Plan:  Continue heparin 1550 units/h Daily heparin level and CBC Warfarin 2.5 mg today Daily INR  Daryon Remmert BS, PharmD, BCPS Clinical Pharmacist 10/27/2022 7:33 AM  Contact: [phone] after 3 PM  "Be curious, not judgmental..." -Debbora Dus

## 2022-10-27 NOTE — Progress Notes (Signed)
Physical Therapy Treatment Patient Details Name: [name] MRN: [id number] DOB: 1940-12-18 Today's Date: 10/27/2022   History of Present Illness 82 yo m referred to ED 6/18 by physician with critical limb ischemia. He is now s/p endarterectomy/bypass on 6/25, s/p R foot transmet amputation on 7/2. 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    PT Comments  Very difficult session with pt incontinent of stool as soon as he came to standing. Once pt on BSC and cleaned up attempted again to mobilize but needed to have further BM. Stood with 2 max assist for pericare and then fatigued rapidly and required +2 max/total assist to prevent fall and exchange BSC for recliner. Pt so exhausted and weak at that point that he was unable to use his UE's to push himself back in chair. Patient will benefit from continued inpatient follow up therapy, <3 hours/day      Assistance Recommended at Discharge Frequent or constant Supervision/Assistance  If plan is discharge home, recommend the following:  Can travel by private vehicle    A lot of help with bathing/dressing/bathroom;Assistance with cooking/housework;Direct supervision/assist for medications management;Direct supervision/assist for financial management;Assist for transportation;Help with stairs or ramp for entrance;Two people to help with walking and/or transfers   No  Equipment Recommendations  None recommended by PT    Recommendations for Other Services       Precautions / Restrictions Precautions Precautions: Fall Required Braces or Orthoses: Other Brace Other Brace: Darco shoe Restrictions Weight Bearing Restrictions: Yes RLE Weight Bearing: Partial weight  bearing RLE Partial Weight Bearing Percentage or Pounds: with Darco shoe on     Mobility  Bed Mobility Overal bed mobility: Needs Assistance Bed Mobility: Rolling, Sidelying to Sit Rolling: Min assist Sidelying to sit: Mod assist       General bed mobility comments: Assist to bring RLE off of bed, elevate trunk into sitting and bring hips to EOB    Transfers Overall transfer level: Needs assistance Equipment used: Rolling walker (2 wheels) Transfers: Sit to/from Stand, Bed to chair/wheelchair/BSC Sit to Stand: Max assist, +2 physical assistance   Step pivot transfers: Max assist, +2 physical assistance       General transfer comment: Heavy assist to power up and for balance. Pt incontinent of stool as soon as he started turning to bsc. Poor balance requiring +2 max to complete turn to [facility]. Stood from [facility] and pt immediately had to return to sitting on [facility] for more BM. Stood again from [facility] to be cleaned and switched BSC to recliner for pt to sit. Pt so fatigued he began folding into flexion with heavy lean to left. Required +2 total to prevent fall and to get chair behind him.    Ambulation/Gait               General Gait Details: Unable   Stairs             Wheelchair Mobility     Tilt Bed    Modified Rankin (Stroke Patients Only)       Balance Overall balance assessment: Needs assistance Sitting-balance support: Feet supported, Bilateral upper extremity supported Sitting balance-Leahy Scale: Poor Sitting balance - Comments: pt with lt lateral lean this date   Standing balance support: During  functional activity, Reliant on assistive device for balance, Bilateral upper extremity supported Standing balance-Leahy Scale: Poor Standing balance comment: Initially mod assist to maintain and then incr to +2 max/total as pt fatigued                            Cognition Arousal/Alertness: Awake/alert Behavior During Therapy: Anxious Overall  Cognitive Status: Within Functional Limits for tasks assessed                                          Exercises      General Comments        Pertinent Vitals/Pain Pain Assessment Pain Assessment: Faces Faces Pain Scale: Hurts even more Pain Location: R foot Pain Descriptors / Indicators: Discomfort Pain Intervention(s): Monitored during session, Repositioned    Home Living                          Prior Function            PT Goals (current goals can now be found in the care plan section) Acute Rehab PT Goals Patient Stated Goal: get stronger Progress towards PT goals: Not progressing toward goals - comment    Frequency    Min 1X/week      PT Plan Current plan remains appropriate    Co-evaluation              AM-PAC PT "6 Clicks" Mobility   Outcome Measure  Help needed turning from your back to your side while in a flat bed without using bedrails?: A Lot Help needed moving from lying on your back to sitting on the side of a flat bed without using bedrails?: A Lot Help needed moving to and from a bed to a chair (including a wheelchair)?: Total Help needed standing up from a chair using your arms (e.g., wheelchair or bedside chair)?: Total Help needed to walk in hospital room?: Total Help needed climbing 3-5 steps with a railing? : Total 6 Click Score: 8    End of Session Equipment Utilized During Treatment: Gait belt;Other (comment) (darco shoe on R LE) Activity Tolerance: Patient limited by fatigue Patient left: in chair;with call bell/phone within reach;with chair alarm set;with family/visitor present Nurse Communication: Mobility status;Need for lift equipment PT Visit Diagnosis: Unsteadiness on feet (R26.81);Other abnormalities of gait and mobility (R26.89);Muscle weakness (generalized) (M62.81);Difficulty in walking, not elsewhere classified (R26.2);History of falling (Z91.81);Repeated falls (R29.6);Pain Pain -  Right/Left: Right Pain - part of body: Leg     Time: 4098-1191 PT Time Calculation (min) (ACUTE ONLY): 41 min  Charges:    $Therapeutic Activity: 23-37 mins PT General Charges $$ ACUTE PT VISIT: 1 Visit                     [facility] PT Acute Rehabilitation Services Office [phone]    [name] [facility] 10/27/2022, 2:15 PM

## 2022-10-27 NOTE — Progress Notes (Addendum)
Progress Note    10/27/2022 10:18 AM 2 Days Post-Op  Subjective:  sitting up trying to eat his breakfast   Vitals:   10/27/22 0344 10/27/22 0740  BP: (!) 128/55 (!) 123/53  Pulse: (!) 55 (!) 59  Resp: 20 20  Temp: 98.5 F (36.9 C) (!) 97.4 F (36.3 C)  SpO2: 95% 99%   Physical Exam: Cardiac:  regular Lungs:  non labored Incisions:  right bk popliteal and R groin incisions are very well appearing Extremities:  Right foot dressed. Doppler Dp/PT signals Abdomen:  soft Neurologic: alert and oriented  CBC    Component Value Date/Time   WBC 11.4 (H) 10/27/2022 0544   RBC 3.16 (L) 10/27/2022 0544   HGB 8.7 (L) 10/27/2022 0544   HGB 14.4 05/06/2021 0953   HCT 28.4 (L) 10/27/2022 0544   HCT 42.6 05/06/2021 0953   PLT 244 10/27/2022 0544   PLT 199 05/06/2021 0953   MCV 89.9 10/27/2022 0544   MCV 87 05/06/2021 0953   MCV 87 06/03/2013 0454   MCH 27.5 10/27/2022 0544   MCHC 30.6 10/27/2022 0544   RDW 20.1 (H) 10/27/2022 0544   RDW 13.6 05/06/2021 0953   RDW 14.2 06/03/2013 0454   LYMPHSABS 1.1 10/11/2022 1056   LYMPHSABS 1.8 05/06/2021 0953   LYMPHSABS 2.7 07/28/2012 0513   MONOABS 0.9 10/11/2022 1056   MONOABS 1.4 (H) 07/28/2012 0513   EOSABS 0.2 10/11/2022 1056   EOSABS 0.3 05/06/2021 0953   EOSABS 0.3 07/28/2012 0513   BASOSABS 0.1 10/11/2022 1056   BASOSABS 0.1 05/06/2021 0953   BASOSABS 1 06/03/2013 0454   BASOSABS 0.1 07/28/2012 0513    BMET    Component Value Date/Time   NA 137 10/26/2022 0611   NA 142 05/06/2021 0953   NA 136 06/03/2013 0454   K 3.7 10/26/2022 0611   K 4.1 06/03/2013 0454   CL 106 10/26/2022 0611   CL 106 06/03/2013 0454   CO2 21 (L) 10/26/2022 0611   CO2 27 06/03/2013 0454   GLUCOSE 114 (H) 10/26/2022 0611   GLUCOSE 80 06/03/2013 0454   BUN 17 10/26/2022 0611   BUN 22 05/06/2021 0953   BUN 22 (H) 06/03/2013 0454   CREATININE 1.40 (H) 10/26/2022 0611   CREATININE 1.59 (H) 06/03/2013 0454   CREATININE 1.02 04/22/2011 1507    CALCIUM 8.0 (L) 10/26/2022 0611   CALCIUM 8.8 06/03/2013 0454   GFRNONAA 50 (L) 10/26/2022 0611   GFRNONAA 43 (L) 06/03/2013 0454   GFRAA 59 (L) 12/30/2016 0103   GFRAA 50 (L) 06/03/2013 0454    INR    Component Value Date/Time   INR 1.5 (H) 10/27/2022 0544   INR 1.3 06/02/2013 0746     Intake/Output Summary (Last 24 hours) at 10/27/2022 1018 Last data filed at 10/27/2022 0400 Gross per 24 hour  Intake 842.29 ml  Output 850 ml  Net -7.71 ml     Assessment/Plan:  82 y.o. [name] is s/p Right TMA 2 Days Post-Op   RLE remains well perfused with doppler PT/Dp signals Right foot dressings left in place. Directions for daily changes. Patient was in middle of eating with prafo boot on so I did not change this morning Continue mobilization as able, heel weight bearing only   [name], PA-C [facility] [phone] 10/27/2022 10:18 AM  I have independently interviewed and examined patient and agree with PA assessment and plan above.  Dressing in place, Dr. [name] will be back for further evaluation.  Hiran Leard C. Donzetta Matters, MD [facility] [place] Office: [phone] Pager: [phone]

## 2022-10-27 NOTE — Progress Notes (Signed)
[name]  ZDG:[id number] DOB: [birth year] DOA: 10/11/2022 PCP: [name], MD    Brief Narrative:  82 year old with a history of severe PAD, CAD, COPD, PAF, and DVT on chronic Coumadin (w/ IVC filter) who presented to the hospital 10/11/2022 with right foot gangrenous ulcers and surrounding infection.  During his hospitalization he underwent an endarterectomy in preparation for TMA, which was ultimately accomplished on 10/25/2022.  Postoperatively he encountered difficulty with persisting shock and required vasopressor support in the ICU.  He is subsequently stabilized and has been transitioned to the Hospitalist service.  Significant Events:  6/18 admitted right foot gangrene 6/19 INR elevated, Coumadin held and vitamin K given per surgery 6/20 Zosyn started, amiodarone, Lopressor, and Lasix on hold 6/21 abdominal aortogram with lower extremity runoff consistent with significant distal disease 6/25 underwent multiple right lower extremity endarterectomies with patch placement per vascular 6/26 remains in the ICU for pressor dependent, PCCM consulted 6/27 no acute issues overnight, remains on low-dose vasopressors peripherally 7/2 remains off of pressors. Taken to OR for R transmetatarsal amputation. 7/4 TRH resumed care of patient   Goals of Care:  Code Status: Full Code   DVT prophylaxis: IV heparin > warfarin   Interim Hx: Afebrile.  Vital signs stable.  Resting comfortably at the time of my visit.  No respiratory distress or uncontrolled pain.  Assessment & Plan:  Septic shock due to right lower extremity infection - resolved To discontinue antibiotics 48 hours postop  Severe PAD with critical limb ischemia and gangrenous ulcers of the right foot Status post endarterectomies and TMA Continue aspirin and statin - heparin per vascular surgery transitioning back to warfarin   AAA 5.5 cm on recent imaging which is enlarged from previous imaging -outpatient follow-up with  [facility]  Chronic diastolic CHF Prior reduced EF which has recovered  Severe aortic stenosis Will need outpatient follow-up to consider eventual intervention  Paroxysmal atrial fibrillation Rate controlled on warfarin  CKD stage IIIa Renal function is stable  Hypothyroidism Continue usual Synthroid dose  Acute blood loss anemia / postop anemia Continue to monitor hemoglobin trend as it appears to be slowly drifting downward -no gross blood loss appreciable  Physical deconditioning Will need SNF for rehab stay  Family Communication: No family present at time of exam Disposition: Will need SNF placement for rehab   Objective: Blood pressure (!) 123/53, pulse (!) 59, temperature (!) 97.4 F (36.3 C), temperature source Oral, resp. rate 20, height 5\' 9"  (1.753 m), weight 68.2 kg, SpO2 99 %.  Intake/Output Summary (Last 24 hours) at 10/27/2022 0758 Last data filed at 10/27/2022 0400 Gross per 24 hour  Intake 1223.38 ml  Output 850 ml  Net 373.38 ml   Filed Weights   10/25/22 0500 10/25/22 0904 10/26/22 0600  Weight: 68.5 kg 68.5 kg 68.2 kg    Examination: General: No acute respiratory distress Lungs: Clear to auscultation bilaterally without wheezes or crackles Cardiovascular: Regular rate without murmur  Abdomen: Nontender, nondistended, soft, bowel sounds positive, no rebound, no ascites, no appreciable mass Extremities: No significant cyanosis, clubbing, or edema bilateral lower extremities  CBC: Recent Labs  Lab 10/25/22 0517 10/26/22 0611 10/27/22 0544  WBC 11.1* 13.3* 11.4*  HGB 9.6* 9.6* 8.7*  HCT 32.1* 31.9* 28.4*  MCV 88.4 88.6 89.9  PLT 336 300 244   Basic Metabolic Panel: Recent Labs  Lab 10/23/22 0124 10/25/22 0517 10/26/22 0611 10/27/22 0544  NA 135 136 137  --   K 4.4 3.5 3.7  --  CL 105 106 106  --   CO2 22 22 21*  --   GLUCOSE 84 92 114*  --   BUN 21 21 17   --   CREATININE 1.48* 1.54* 1.40*  --   CALCIUM 8.1* 7.9* 8.0*  --   MG  --  2.2   --  2.2   GFR: Estimated Creatinine Clearance: 39.9 mL/min (A) (by C-G formula based on SCr of 1.4 mg/dL (H)).   Scheduled Meds:  amiodarone  200 mg Oral BID   aspirin EC  81 mg Oral Q0600   atorvastatin  20 mg Oral Daily   Chlorhexidine Gluconate Cloth  6 each Topical Daily   feeding supplement  1 Container Oral TID BM   Gerhardt's butt cream  1 Application Topical BID   levothyroxine  150 mcg Oral Q0600   midodrine  10 mg Oral TID WC   pantoprazole  40 mg Oral Daily   polyethylene glycol  17 g Oral Daily   pregabalin  50 mg Oral Daily   senna-docusate  1 tablet Oral BID   warfarin  2.5 mg Oral ONCE-1600   Warfarin - Pharmacist Dosing Inpatient   Does not apply q1600   Continuous Infusions:  sodium chloride     heparin 1,550 Units/hr (10/27/22 0648)   magnesium sulfate bolus IVPB     piperacillin-tazobactam (ZOSYN)  IV 3.375 g (10/27/22 0538)     LOS: 16 days   [name], MD [facility] Office  [phone] Pager - Text Page per Loretha Stapler  If 7PM-7AM, please contact night-coverage per Amion 10/27/2022, 7:58 AM

## 2022-10-28 LAB — CBC
HCT: 30.4 % — ABNORMAL LOW (ref 39.0–52.0)
Hemoglobin: 9.1 g/dL — ABNORMAL LOW (ref 13.0–17.0)
MCH: 27.2 pg (ref 26.0–34.0)
MCHC: 29.9 g/dL — ABNORMAL LOW (ref 30.0–36.0)
MCV: 91 fL (ref 80.0–100.0)
Platelets: 256 10*3/uL (ref 150–400)
RBC: 3.34 MIL/uL — ABNORMAL LOW (ref 4.22–5.81)
RDW: 20.4 % — ABNORMAL HIGH (ref 11.5–15.5)
WBC: 10 10*3/uL (ref 4.0–10.5)
nRBC: 0.3 % — ABNORMAL HIGH (ref 0.0–0.2)

## 2022-10-28 LAB — BASIC METABOLIC PANEL
Anion gap: 9 (ref 5–15)
BUN: 16 mg/dL (ref 8–23)
CO2: 21 mmol/L — ABNORMAL LOW (ref 22–32)
Calcium: 8.2 mg/dL — ABNORMAL LOW (ref 8.9–10.3)
Chloride: 107 mmol/L (ref 98–111)
Creatinine, Ser: 1.26 mg/dL — ABNORMAL HIGH (ref 0.61–1.24)
GFR, Estimated: 57 mL/min — ABNORMAL LOW (ref >60)
Glucose, Bld: 103 mg/dL — ABNORMAL HIGH (ref 70–99)
Potassium: 3.4 mmol/L — ABNORMAL LOW (ref 3.5–5.1)
Sodium: 137 mmol/L (ref 135–145)

## 2022-10-28 LAB — PROTIME-INR
INR: 1.4 — ABNORMAL HIGH (ref 0.8–1.2)
Prothrombin Time: 17.3 seconds — ABNORMAL HIGH (ref 11.4–15.2)

## 2022-10-28 LAB — HEPARIN LEVEL (UNFRACTIONATED): Heparin Unfractionated: 0.43 IU/mL (ref 0.30–0.70)

## 2022-10-28 MED ORDER — ENOXAPARIN SODIUM 80 MG/0.8ML IJ SOSY
1.0000 mg/kg | PREFILLED_SYRINGE | Freq: Two times a day (BID) | INTRAMUSCULAR | Status: DC
  Administered 2022-10-28 – 2022-10-31 (×6): 67.5 mg via SUBCUTANEOUS
  Filled 2022-10-28 (×6): qty 0.8

## 2022-10-28 MED ORDER — FUROSEMIDE 40 MG PO TABS: 40.0000 mg | ORAL_TABLET | Freq: Every day | ORAL | Status: DC

## 2022-10-28 MED ORDER — SACCHAROMYCES BOULARDII 250 MG PO CAPS
250.0000 mg | ORAL_CAPSULE | Freq: Two times a day (BID) | ORAL | Status: DC
  Administered 2022-10-28 – 2022-10-31 (×7): 250 mg via ORAL
  Filled 2022-10-28 (×7): qty 1

## 2022-10-28 MED ORDER — ENOXAPARIN SODIUM 80 MG/0.8ML IJ SOSY
1.0000 mg/kg | PREFILLED_SYRINGE | Freq: Once | INTRAMUSCULAR | Status: AC
  Administered 2022-10-28: 67.5 mg via SUBCUTANEOUS
  Filled 2022-10-28: qty 0.8

## 2022-10-28 MED ORDER — POTASSIUM CHLORIDE CRYS ER 20 MEQ PO TBCR
40.0000 meq | EXTENDED_RELEASE_TABLET | Freq: Once | ORAL | Status: AC
  Administered 2022-10-28: 40 meq via ORAL
  Filled 2022-10-28: qty 2

## 2022-10-28 MED ORDER — MIDODRINE HCL 5 MG PO TABS
10.0000 mg | ORAL_TABLET | Freq: Two times a day (BID) | ORAL | Status: DC
  Administered 2022-10-28 – 2022-10-29 (×3): 10 mg via ORAL
  Filled 2022-10-28 (×3): qty 2

## 2022-10-28 MED ORDER — WARFARIN SODIUM 2 MG PO TABS
3.0000 mg | ORAL_TABLET | Freq: Once | ORAL | Status: AC
  Administered 2022-10-28: 3 mg via ORAL
  Filled 2022-10-28: qty 1

## 2022-10-28 MED ORDER — MOMETASONE FURO-FORMOTEROL FUM 200-5 MCG/ACT IN AERO
2.0000 | INHALATION_SPRAY | Freq: Two times a day (BID) | RESPIRATORY_TRACT | Status: DC
  Administered 2022-10-28 – 2022-10-31 (×6): 2 via RESPIRATORY_TRACT
  Filled 2022-10-28: qty 8.8

## 2022-10-28 MED ORDER — FUROSEMIDE 10 MG/ML IJ SOLN
40.0000 mg | Freq: Once | INTRAMUSCULAR | Status: AC
  Administered 2022-10-28: 40 mg via INTRAVENOUS
  Filled 2022-10-28: qty 4

## 2022-10-28 NOTE — Progress Notes (Signed)
[name]  WUJ:[id number] DOB: [birth year] DOA: 10/11/2022 PCP: [name], MD    Brief Narrative:  82 year old with a history of severe PAD, CAD, COPD, PAF, and DVT on chronic Coumadin (w/ IVC filter) who presented to the hospital 10/11/2022 with right foot gangrenous ulcers and surrounding infection.  During his hospitalization he underwent an endarterectomy in preparation for TMA, which was ultimately accomplished on 10/25/2022.  Postoperatively he encountered difficulty with persisting shock and required vasopressor support in the ICU.  He is subsequently stabilized and has been transitioned to the Hospitalist service.  Significant Events:  6/18 admitted right foot gangrene 6/19 INR elevated, Coumadin held and vitamin K given per surgery 6/20 Zosyn started, amiodarone, Lopressor, and Lasix on hold 6/21 abdominal aortogram with lower extremity runoff consistent with significant distal disease 6/25 underwent multiple right lower extremity endarterectomies with patch placement per vascular 6/26 remains in the ICU for pressor dependent, PCCM consulted 6/27 no acute issues overnight, remains on low-dose vasopressors peripherally 7/2 remains off of pressors. Taken to OR for R transmetatarsal amputation. 7/4 TR[facility] resumed care of patient   Goals of Care:  Code Status: Full Code   DVT prophylaxis: lovenox > warfarin   Interim Hx: No acute events recorded overnight.  Afebrile.  Vital signs stable.  Some shortness of breath today.  A bit discouraged by his generalized weakness.  Denies chest pain nausea or vomiting.  Reports poor appetite but is attempting to maintain consistent intake.  Assessment & Plan:  Septic shock due to right lower extremity infection - resolved Has now completed an antibiotic course and is clinically stable with resolution of sepsis and shock state  Severe PAD with critical limb ischemia and gangrenous ulcers of the right foot Status post endarterectomies and  TMA - continue aspirin and statin   AAA 5.5 cm on recent imaging which is enlarged from previous imaging - outpatient follow-up with [facility]  Chronic diastolic CHF Prior reduced EF which has recovered - net -3 L this admission - gently diurese today in attempt to improve pulmonary function   Severe aortic stenosis Will need outpatient follow-up to consider eventual intervention  Paroxysmal atrial fibrillation Rate controlled - on warfarin  CKD stage IIIa Renal function is stable/improving  Hypothyroidism Continue usual Synthroid dose  Acute blood loss anemia / postop anemia Hemoglobin appears to have stabilized at this time  Physical deconditioning Will need SNF for rehab stay - anticipate d/c to SNF 10/29/22  Family Communication: Spoke with wife at bedside Disposition: Will need SNF placement for rehab -anticipate discharge 10/29/2022   Objective: Blood pressure (!) 110/49, pulse (!) 58, temperature 97.6 F (36.4 C), temperature source Oral, resp. rate 20, height 5\' 9"  (1.753 m), weight 68.2 kg, SpO2 97 %.  Intake/Output Summary (Last 24 hours) at 10/28/2022 0739 Last data filed at 10/27/2022 2000 Gross per 24 hour  Intake 720 ml  Output 600 ml  Net 120 ml    Filed Weights   10/25/22 0500 10/25/22 0904 10/26/22 0600  Weight: 68.5 kg 68.5 kg 68.2 kg    Examination: General: No acute respiratory distress -able to carry on a conversation Lungs: Mild diffuse expiratory wheezing with bibasilar crackles but good air movement throughout Cardiovascular: Regular rate without murmur  Abdomen: Nontender, nondistended, soft, bowel sounds positive, no rebound, no ascites Extremities: Trace edema bilateral lower extremities -right foot dressing intact and dry  CBC: Recent Labs  Lab 10/26/22 0611 10/27/22 0544 10/28/22 0125  WBC 13.3* 11.4* 10.0  HGB  9.6* 8.7* 9.1*  HCT 31.9* 28.4* 30.4*  MCV 88.6 89.9 91.0  PLT 300 244 256    Basic Metabolic Panel: Recent Labs  Lab  10/25/22 0517 10/26/22 0611 10/27/22 0544 10/28/22 0125  NA 136 137  --  137  K 3.5 3.7  --  3.4*  CL 106 106  --  107  CO2 22 21*  --  21*  GLUCOSE 92 114*  --  103*  BUN 21 17  --  16  CREATININE 1.54* 1.40*  --  1.26*  CALCIUM 7.9* 8.0*  --  8.2*  MG 2.2  --  2.2  --     GFR: Estimated Creatinine Clearance: 44.4 mL/min (A) (by C-G formula based on SCr of 1.26 mg/dL (H)).   Scheduled Meds:  amiodarone  200 mg Oral BID   aspirin EC  81 mg Oral Q0600   atorvastatin  20 mg Oral Daily   bethanechol  10 mg Oral TID   Chlorhexidine Gluconate Cloth  6 each Topical Daily   feeding supplement  1 Container Oral TID BM   Gerhardt's butt cream  1 Application Topical BID   levothyroxine  150 mcg Oral Q0600   midodrine  10 mg Oral TID WC   pantoprazole  40 mg Oral Daily   polyethylene glycol  17 g Oral Daily   pregabalin  50 mg Oral Daily   senna-docusate  1 tablet Oral BID   tamsulosin  0.4 mg Oral Daily   Warfarin - Pharmacist Dosing Inpatient   Does not apply q1600   Continuous Infusions:  heparin 1,550 Units/hr (10/28/22 0037)     LOS: 17 days   [name], MD [facility] Office  [phone] Pager - Text Page per Loretha Stapler  If 7PM-7AM, please contact night-coverage per Amion 10/28/2022, 7:39 AM

## 2022-10-28 NOTE — TOC Progression Note (Addendum)
Transition of Care [facility]) - Progression Note    Patient Details  Name: [name] MRN: [id number] Date of Birth: 12/01/1940  [facility] of [facility]) CM/SW Contact  Dellie Burns [place], [place] Phone Number: 10/28/2022, 11:29 AM  Clinical Narrative: current bed offers provided to pt's wife and she has accepted [facility]. Per MD, pt not ready for dc today but potentially over weekend. Updated Marcelino Duster with [facility], await response re ability to accept over weekend. Will provide updates.  UPDATE 1155: confirmed with Marcelino Duster at [facility], they are able to admit pt over weekend. MD aware.   Dellie Burns, MSW, LCSW [phone] (coverage)      Expected Discharge Plan: Skilled Nursing Facility Barriers to Discharge: Continued Medical Work up  Expected Discharge Plan and Services In-house Referral: Clinical Social Work Discharge Planning Services: CM Consult Post Acute Care Choice: Home Health Living arrangements for the past 2 months: Single Family Home                             HH Agency: [facility]         Social Determinants of Health (SDOH) Interventions SDOH Screenings   Food Insecurity: No Food Insecurity (10/14/2022)  Housing: Low Risk  (10/14/2022)  Transportation Needs: No Transportation Needs (10/14/2022)  Utilities: Not At Risk (10/14/2022)  Alcohol Screen: Low Risk  (07/13/2022)  Depression (PHQ2-9): Low Risk  (07/13/2022)  Financial Resource Strain: Low Risk  (07/13/2022)  Physical Activity: Insufficiently Active (07/13/2022)  Social Connections: Moderately Integrated (07/13/2022)  Stress: No Stress Concern Present (07/13/2022)  Tobacco Use: Medium Risk (10/26/2022)    Readmission Risk Interventions     No data to display

## 2022-10-28 NOTE — Plan of Care (Signed)
  Problem: Activity: Goal: Ability to return to baseline activity level will improve Outcome: Progressing   Problem: Fluid Volume: Goal: Ability to maintain a balanced intake and output will improve Outcome: Progressing   Problem: Nutritional: Goal: Maintenance of adequate nutrition will improve Outcome: Progressing   

## 2022-10-28 NOTE — Progress Notes (Addendum)
ANTICOAGULATION CONSULT NOTE  Pharmacy Consult for heparin + warfarin Indication: critical limb ischemia, hx afib/DVT  Allergies  Allergen Reactions   Plavix [Clopidogrel Bisulfate]     Brain hemorrhage prev while on plavix and aspirin    Patient Measurements: Height: 5\' 9"  (175.3 cm) Weight: 68.2 kg (150 lb 5.7 oz) IBW/kg (Calculated) : 70.7 Heparin Dosing Weight: 68 kg  Vital Signs: Temp: 97.6 F (36.4 C) (07/05 0858) Temp Source: Oral (07/05 0858) BP: 127/54 (07/05 0858) Pulse Rate: 58 (07/05 0858)  Labs: Recent Labs    10/26/22 0611 10/27/22 0544 10/28/22 0125  HGB 9.6* 8.7* 9.1*  HCT 31.9* 28.4* 30.4*  PLT 300 244 256  LABPROT 17.1* 18.4* 17.3*  INR 1.4* 1.5* 1.4*  HEPARINUNFRC 0.38 0.39 0.43  CREATININE 1.40*  --  1.26*     Estimated Creatinine Clearance: 44.4 mL/min (A) (by C-G formula based on SCr of 1.26 mg/dL (H)).   Assessment: 45 yom with hx afib/DVT s/p IVC filter on warfarin PTA presenting with critical limb ischemia. He is now s/p endarterectomy/bypass on 6/25 and s/p transmetatarsal amputation on 7/2.  Pharmacy dosing heparin and restarted warfarin on 7/3 (slow resumption per [facility]). -INR= 1.4, Hg= 10  Warfarin PTA: 2.5mg /d except take none on Sat   Heparin level is therapeutic at 0.43 on 1550 units/hr. INR is subtherapeutic at 1.4. No bleeding noted, Hgb low stable and platelets are normal.   Goal of Therapy:  INR 2-3 Heparin level 0.3-0.7 units/ml Monitor platelets by anticoagulation protocol: Yes   Plan:  Continue heparin drip at 1550 units/hr Daily heparin level, INR and CBC Warfarin 3 mg PO today Monitor for s/sx of bleeding  Thank you for involving pharmacy in this patient's care.  Loura Back, PharmD, BCPS Clinical Pharmacist Clinical phone for 10/28/2022 is [phone] 10/28/2022 9:23 AM  Addendum: Consulted to change to enoxaparin to warfarin bridge.  D/C heparin drip Enoxaparin 1 mg/kg SQ q12h until INR >=2 CBC q72h while on  enoxaparin   Loura Back, PharmD, BCPS 12:48 PM

## 2022-10-28 NOTE — Progress Notes (Signed)
  VASCULAR SURGERY ASSESSMENT & PLAN:   POD 3 RIGHT TMA: His TMA is healing well.  He has excellent Doppler signals in the right foot.  He is back on Coumadin.  Vascular surgery will follow peripherally.  SUBJECTIVE:   No complaints this morning.  PHYSICAL EXAM:   Vitals:   10/28/22 0028 10/28/22 0527 10/28/22 0858 10/28/22 1258  BP: (!) 120/54 (!) 110/49 (!) 127/54 (!) 111/48  Pulse: (!) 57 (!) 58 (!) 58 (!) 59  Resp: 19 20 20 20   Temp: 97.6 F (36.4 C) 97.6 F (36.4 C) 97.6 F (36.4 C) (!) 97.5 F (36.4 C)  TempSrc: Oral Oral Oral Oral  SpO2: 94% 97% 100% 100%  Weight:      Height:       His right TMA looks good. He has a brisk posterior tibial and dorsalis pedis signal with Doppler.  LABS:   Lab Results  Component Value Date   WBC 10.0 10/28/2022   HGB 9.1 (L) 10/28/2022   HCT 30.4 (L) 10/28/2022   MCV 91.0 10/28/2022   PLT 256 10/28/2022   Lab Results  Component Value Date   CREATININE 1.26 (H) 10/28/2022   Lab Results  Component Value Date   INR 1.4 (H) 10/28/2022   PROBLEM LIST:    Principal Problem:   Critical limb ischemia of right lower extremity with ulceration of foot (HCC) Active Problems:   Hypothyroidism   Abdominal aortic aneurysm (HCC)   COPD (chronic obstructive pulmonary disease) (HCC)   Ischemic cardiomyopathy   Peripheral arterial disease (HCC)   HTN (hypertension)   CKD (chronic kidney disease) stage 3, GFR 30-59 ml/min (HCC)   PAF (paroxysmal atrial fibrillation) (HCC)   DVT (deep venous thrombosis) (HCC)   Aortic valve stenosis   CURRENT MEDS:    amiodarone  200 mg Oral BID   aspirin EC  81 mg Oral Q0600   atorvastatin  20 mg Oral Daily   bethanechol  10 mg Oral TID   Chlorhexidine Gluconate Cloth  6 each Topical Daily   enoxaparin (LOVENOX) injection  1 mg/kg Subcutaneous Q12H   feeding supplement  1 Container Oral TID BM   furosemide  40 mg Intravenous Once   Gerhardt's butt cream  1 Application Topical BID    levothyroxine  150 mcg Oral Q0600   midodrine  10 mg Oral BID WC   mometasone-formoterol  2 puff Inhalation BID   pantoprazole  40 mg Oral Daily   pregabalin  50 mg Oral Daily   saccharomyces boulardii  250 mg Oral BID   tamsulosin  0.4 mg Oral Daily   warfarin  3 mg Oral ONCE-1600   Warfarin - Pharmacist Dosing Inpatient   Does not apply q1600    Waverly Ferrari Office: [phone] 10/28/2022

## 2022-10-29 ENCOUNTER — Inpatient Hospital Stay (HOSPITAL_COMMUNITY): Payer: Medicare Other

## 2022-10-29 LAB — CBC
HCT: 29.7 % — ABNORMAL LOW (ref 39.0–52.0)
Hemoglobin: 8.7 g/dL — ABNORMAL LOW (ref 13.0–17.0)
MCH: 27.2 pg (ref 26.0–34.0)
MCHC: 29.3 g/dL — ABNORMAL LOW (ref 30.0–36.0)
MCV: 92.8 fL (ref 80.0–100.0)
Platelets: 260 10*3/uL (ref 150–400)
RBC: 3.2 MIL/uL — ABNORMAL LOW (ref 4.22–5.81)
RDW: 20.5 % — ABNORMAL HIGH (ref 11.5–15.5)
WBC: 7.6 10*3/uL (ref 4.0–10.5)
nRBC: 0.3 % — ABNORMAL HIGH (ref 0.0–0.2)

## 2022-10-29 LAB — PROTIME-INR
INR: 1.4 — ABNORMAL HIGH (ref 0.8–1.2)
Prothrombin Time: 17 seconds — ABNORMAL HIGH (ref 11.4–15.2)

## 2022-10-29 MED ORDER — CLONAZEPAM 0.5 MG PO TABS: 0.5000 mg | ORAL_TABLET | Freq: Two times a day (BID) | ORAL | Status: DC | PRN

## 2022-10-29 MED ORDER — WARFARIN SODIUM 2 MG PO TABS
3.0000 mg | ORAL_TABLET | Freq: Once | ORAL | Status: AC
  Administered 2022-10-29: 3 mg via ORAL
  Filled 2022-10-29: qty 1

## 2022-10-29 MED ORDER — METHYLPREDNISOLONE SODIUM SUCC 40 MG IJ SOLR
40.0000 mg | Freq: Every day | INTRAMUSCULAR | Status: AC
  Administered 2022-10-29 – 2022-10-30 (×2): 40 mg via INTRAVENOUS
  Filled 2022-10-29 (×2): qty 1

## 2022-10-29 NOTE — Plan of Care (Signed)
  Problem: Education: Goal: Understanding of CV disease, CV risk reduction, and recovery process will improve Outcome: Progressing Goal: Individualized Educational Video(s) Outcome: Progressing   Problem: Activity: Goal: Ability to return to baseline activity level will improve Outcome: Progressing   Problem: Cardiovascular: Goal: Ability to achieve and maintain adequate cardiovascular perfusion will improve Outcome: Progressing Goal: Vascular access site(s) Level 0-1 will be maintained Outcome: Progressing   Problem: Health Behavior/Discharge Planning: Goal: Ability to safely manage health-related needs after discharge will improve Outcome: Progressing   Problem: Education: Goal: Ability to describe self-care measures that may prevent or decrease complications (Diabetes Survival Skills Education) will improve Outcome: Progressing Goal: Individualized Educational Video(s) Outcome: Progressing   Problem: Coping: Goal: Ability to adjust to condition or change in health will improve Outcome: Progressing   Problem: Fluid Volume: Goal: Ability to maintain a balanced intake and output will improve Outcome: Progressing   Problem: Health Behavior/Discharge Planning: Goal: Ability to identify and utilize available resources and services will improve Outcome: Progressing Goal: Ability to manage health-related needs will improve Outcome: Progressing   Problem: Metabolic: Goal: Ability to maintain appropriate glucose levels will improve Outcome: Progressing   Problem: Nutritional: Goal: Maintenance of adequate nutrition will improve Outcome: Progressing Goal: Progress toward achieving an optimal weight will improve Outcome: Progressing   Problem: Skin Integrity: Goal: Risk for impaired skin integrity will decrease Outcome: Progressing   Problem: Tissue Perfusion: Goal: Adequacy of tissue perfusion will improve Outcome: Progressing   Problem: Education: Goal: Knowledge  of General Education information will improve Description: Including pain rating scale, medication(s)/side effects and non-pharmacologic comfort measures Outcome: Progressing   Problem: Health Behavior/Discharge Planning: Goal: Ability to manage health-related needs will improve Outcome: Progressing   Problem: Clinical Measurements: Goal: Ability to maintain clinical measurements within normal limits will improve Outcome: Progressing Goal: Will remain free from infection Outcome: Progressing Goal: Diagnostic test results will improve Outcome: Progressing Goal: Respiratory complications will improve Outcome: Progressing Goal: Cardiovascular complication will be avoided Outcome: Progressing   Problem: Activity: Goal: Risk for activity intolerance will decrease Outcome: Progressing   Problem: Nutrition: Goal: Adequate nutrition will be maintained Outcome: Progressing   Problem: Coping: Goal: Level of anxiety will decrease Outcome: Progressing   Problem: Elimination: Goal: Will not experience complications related to bowel motility Outcome: Progressing Goal: Will not experience complications related to urinary retention Outcome: Progressing   Problem: Pain Managment: Goal: General experience of comfort will improve Outcome: Progressing   Problem: Safety: Goal: Ability to remain free from injury will improve Outcome: Progressing   Problem: Skin Integrity: Goal: Risk for impaired skin integrity will decrease Outcome: Progressing   Problem: Education: Goal: Knowledge of prescribed regimen will improve Outcome: Progressing   Problem: Activity: Goal: Ability to tolerate increased activity will improve Outcome: Progressing   Problem: Bowel/Gastric: Goal: Gastrointestinal status for postoperative course will improve Outcome: Progressing   Problem: Clinical Measurements: Goal: Postoperative complications will be avoided or minimized Outcome: Progressing Goal:  Signs and symptoms of graft occlusion will improve Outcome: Progressing   Problem: Skin Integrity: Goal: Demonstration of wound healing without infection will improve Outcome: Progressing

## 2022-10-29 NOTE — Plan of Care (Signed)
Problem: Education: Goal: Understanding of CV disease, CV risk reduction, and recovery process will improve 10/29/2022 0105 by Orson Ape, RN Outcome: Progressing 10/29/2022 0042 by Orson Ape, RN Outcome: Progressing Goal: Individualized Educational Video(s) 10/29/2022 0105 by Orson Ape, RN Outcome: Progressing 10/29/2022 0042 by Orson Ape, RN Outcome: Progressing   Problem: Activity: Goal: Ability to return to baseline activity level will improve 10/29/2022 0105 by Orson Ape, RN Outcome: Progressing 10/29/2022 0042 by Orson Ape, RN Outcome: Progressing   Problem: Cardiovascular: Goal: Ability to achieve and maintain adequate cardiovascular perfusion will improve 10/29/2022 0105 by Orson Ape, RN Outcome: Progressing 10/29/2022 0042 by Orson Ape, RN Outcome: Progressing Goal: Vascular access site(s) Level 0-1 will be maintained 10/29/2022 0105 by Orson Ape, RN Outcome: Progressing 10/29/2022 0042 by Orson Ape, RN Outcome: Progressing   Problem: Health Behavior/Discharge Planning: Goal: Ability to safely manage health-related needs after discharge will improve 10/29/2022 0105 by Orson Ape, RN Outcome: Progressing 10/29/2022 0042 by Orson Ape, RN Outcome: Progressing   Problem: Education: Goal: Ability to describe self-care measures that may prevent or decrease complications (Diabetes Survival Skills Education) will improve 10/29/2022 0105 by Orson Ape, RN Outcome: Progressing 10/29/2022 0042 by Orson Ape, RN Outcome: Progressing Goal: Individualized Educational Video(s) 10/29/2022 0105 by Orson Ape, RN Outcome: Progressing 10/29/2022 0042 by Orson Ape, RN Outcome: Progressing   Problem: Coping: Goal: Ability to adjust to condition or change in health will improve 10/29/2022 0105 by Orson Ape, RN Outcome: Progressing 10/29/2022 0042 by Orson Ape, RN Outcome:  Progressing   Problem: Fluid Volume: Goal: Ability to maintain a balanced intake and output will improve 10/29/2022 0105 by Orson Ape, RN Outcome: Progressing 10/29/2022 0042 by Orson Ape, RN Outcome: Progressing   Problem: Health Behavior/Discharge Planning: Goal: Ability to identify and utilize available resources and services will improve 10/29/2022 0105 by Orson Ape, RN Outcome: Progressing 10/29/2022 0042 by Orson Ape, RN Outcome: Progressing Goal: Ability to manage health-related needs will improve 10/29/2022 0105 by Orson Ape, RN Outcome: Progressing 10/29/2022 0042 by Orson Ape, RN Outcome: Progressing   Problem: Metabolic: Goal: Ability to maintain appropriate glucose levels will improve 10/29/2022 0105 by Orson Ape, RN Outcome: Progressing 10/29/2022 0042 by Orson Ape, RN Outcome: Progressing   Problem: Nutritional: Goal: Maintenance of adequate nutrition will improve 10/29/2022 0105 by Orson Ape, RN Outcome: Progressing 10/29/2022 0042 by Orson Ape, RN Outcome: Progressing Goal: Progress toward achieving an optimal weight will improve 10/29/2022 0105 by Orson Ape, RN Outcome: Progressing 10/29/2022 0042 by Orson Ape, RN Outcome: Progressing   Problem: Skin Integrity: Goal: Risk for impaired skin integrity will decrease 10/29/2022 0105 by Orson Ape, RN Outcome: Progressing 10/29/2022 0042 by Orson Ape, RN Outcome: Progressing   Problem: Tissue Perfusion: Goal: Adequacy of tissue perfusion will improve 10/29/2022 0105 by Orson Ape, RN Outcome: Progressing 10/29/2022 0042 by Orson Ape, RN Outcome: Progressing   Problem: Education: Goal: Knowledge of General Education information will improve Description: Including pain rating scale, medication(s)/side effects and non-pharmacologic comfort measures 10/29/2022 0105 by Orson Ape, RN Outcome:  Progressing 10/29/2022 0042 by Orson Ape, RN Outcome: Progressing   Problem: Health Behavior/Discharge Planning: Goal: Ability to manage health-related needs will improve 10/29/2022 0105 by Orson Ape, RN Outcome: Progressing 10/29/2022 0042 by Orson Ape, RN Outcome: Progressing   Problem: Clinical Measurements: Goal: Ability to maintain clinical measurements within normal limits will improve 10/29/2022 0105 by Orson Ape, RN Outcome: Progressing 10/29/2022 0042 by Orson Ape, RN Outcome: Progressing Goal: Will remain free from infection 10/29/2022 0105 by  Orson Ape, RN Outcome: Progressing 10/29/2022 0042 by Orson Ape, RN Outcome: Progressing Goal: Diagnostic test results will improve 10/29/2022 0105 by Orson Ape, RN Outcome: Progressing 10/29/2022 0042 by Orson Ape, RN Outcome: Progressing Goal: Respiratory complications will improve 10/29/2022 0105 by Orson Ape, RN Outcome: Progressing 10/29/2022 0042 by Orson Ape, RN Outcome: Progressing Goal: Cardiovascular complication will be avoided 10/29/2022 0105 by Orson Ape, RN Outcome: Progressing 10/29/2022 0042 by Orson Ape, RN Outcome: Progressing   Problem: Activity: Goal: Risk for activity intolerance will decrease 10/29/2022 0105 by Orson Ape, RN Outcome: Progressing 10/29/2022 0042 by Orson Ape, RN Outcome: Progressing   Problem: Nutrition: Goal: Adequate nutrition will be maintained 10/29/2022 0105 by Orson Ape, RN Outcome: Progressing 10/29/2022 0042 by Orson Ape, RN Outcome: Progressing   Problem: Coping: Goal: Level of anxiety will decrease 10/29/2022 0105 by Orson Ape, RN Outcome: Progressing 10/29/2022 0042 by Orson Ape, RN Outcome: Progressing   Problem: Elimination: Goal: Will not experience complications related to bowel motility 10/29/2022 0105 by Orson Ape, RN Outcome:  Progressing 10/29/2022 0042 by Orson Ape, RN Outcome: Progressing Goal: Will not experience complications related to urinary retention 10/29/2022 0105 by Orson Ape, RN Outcome: Progressing 10/29/2022 0042 by Orson Ape, RN Outcome: Progressing   Problem: Pain Managment: Goal: General experience of comfort will improve 10/29/2022 0105 by Orson Ape, RN Outcome: Progressing 10/29/2022 0042 by Orson Ape, RN Outcome: Progressing   Problem: Safety: Goal: Ability to remain free from injury will improve 10/29/2022 0105 by Orson Ape, RN Outcome: Progressing 10/29/2022 0042 by Orson Ape, RN Outcome: Progressing   Problem: Skin Integrity: Goal: Risk for impaired skin integrity will decrease 10/29/2022 0105 by Orson Ape, RN Outcome: Progressing 10/29/2022 0042 by Orson Ape, RN Outcome: Progressing   Problem: Education: Goal: Knowledge of prescribed regimen will improve 10/29/2022 0105 by Orson Ape, RN Outcome: Progressing 10/29/2022 0042 by Orson Ape, RN Outcome: Progressing   Problem: Activity: Goal: Ability to tolerate increased activity will improve 10/29/2022 0105 by Orson Ape, RN Outcome: Progressing 10/29/2022 0042 by Orson Ape, RN Outcome: Progressing   Problem: Bowel/Gastric: Goal: Gastrointestinal status for postoperative course will improve 10/29/2022 0105 by Orson Ape, RN Outcome: Progressing 10/29/2022 0042 by Orson Ape, RN Outcome: Progressing   Problem: Clinical Measurements: Goal: Postoperative complications will be avoided or minimized 10/29/2022 0105 by Orson Ape, RN Outcome: Progressing 10/29/2022 0042 by Orson Ape, RN Outcome: Progressing Goal: Signs and symptoms of graft occlusion will improve 10/29/2022 0105 by Orson Ape, RN Outcome: Progressing 10/29/2022 0042 by Orson Ape, RN Outcome: Progressing   Problem: Skin Integrity: Goal:  Demonstration of wound healing without infection will improve 10/29/2022 0105 by Orson Ape, RN Outcome: Progressing 10/29/2022 0042 by Orson Ape, RN Outcome: Progressing

## 2022-10-29 NOTE — Discharge Summary (Incomplete)
DISCHARGE SUMMARY  [name]  MR#: [id number]  DOB:10-Dec-1940  Date of Admission: 10/11/2022 Date of Discharge: 10/31/2022  Attending Physician:Temara Lanum Silvestre Gunner, MD  Patient's WUJ:[id number], Karleen Hampshire, MD  Consults: Vascular Surgery  PCCM  Disposition: D/C to SNF for rehab stay   Follow-up Appts:  Contact information for follow-up providers     Chuck Hint, MD Follow up in 3 day(s).   Specialties: Vascular Surgery, Cardiology Why: Office will call you to arrange your appt (sent) Contact information: [address] [place] [address] [phone]              Contact information for after-discharge care     Destination     [facility] Preferred SNF .   Service: Skilled Nursing Contact information: [name] [place] [address] [phone]                     Tests Needing Follow-up: -outpatient monitoring of his AAA will be needed -will need staple removal and ABIs in Vascular Surgery office in 4 weeks  -Lovenox therapy can be discontinued once INR is within therapeutic range of 2.0-3.0  Discharge Diagnoses: Septic shock due to right lower extremity infection - resolved Severe PAD with critical limb ischemia and gangrenous ulcers of the right foot S/P Right femoral to below-knee popliteal artery bypass  AAA Chronic diastolic CHF Severe aortic stenosis Paroxysmal atrial fibrillation CKD stage IIIa Hypothyroidism Acute blood loss anemia / postop anemia Physical deconditioning  Initial presentation: 82 year old with a history of severe PAD, CAD, COPD, PAF, and DVT on chronic Coumadin (w/ IVC filter) who presented to the hospital 10/11/2022 with right foot gangrenous ulcers and surrounding infection. During his hospitalization he underwent an endarterectomy in preparation for TMA, which was ultimately accomplished on 10/25/2022. Postoperatively he encountered difficulty with  persisting shock and required vasopressor support in the ICU. He has subsequently stabilized and has been transitioned to the Hospitalist service.   Hospital Course: 6/18 admitted right foot gangrene 6/19 INR elevated, Coumadin held and vitamin K given per surgery 6/20 Zosyn started, amiodarone, Lopressor, and Lasix on hold 6/21 abdominal aortogram with lower extremity runoff consistent with significant distal disease 6/25 underwent multiple right lower extremity endarterectomies with R femoral to below-knee popliteal artery bypass 6/26 remains in the ICU for pressor dependent, PCCM consulted 6/27 no acute issues overnight, remains on low-dose vasopressors peripherally 7/2 remains off of pressors. Taken to OR for R transmetatarsal amputation. 7/4 [facility] resumed care of patient   Septic shock due to right lower extremity infection - resolved Has now completed an antibiotic course and is clinically stable with resolution of sepsis and shock state   Severe PAD with critical limb ischemia and gangrenous ulcers of the right foot Status post R iliofemoral endarterectomy, popliteal endarterectomy, and R femoral to below-knee popliteal artery bypass 10/18/22 - s/p R TMA 10/25/22 - continue aspirin and statin - post-op care per Vascular Surgery -wound undressed and inspected prior to discharge and is doing very well -follow-up with Vascular Surgery in 4 weeks for staple removal and ABIs   AAA 5.5 cm on recent imaging which is enlarged from previous imaging - outpatient follow-up with [facility] indicated   Chronic diastolic CHF Prior reduced EF which has recovered - net -3.6 L this admission - does not presently require daily scheduled diuretic - monitor volume status    Severe aortic stenosis Will need outpatient follow-up to consider eventual intervention - AVA 0.83cm2  Paroxysmal atrial fibrillation Rate controlled - on warfarin with Lovenox bridge until therapeutic  History of DVT with IVC filter On  chronic warfarin -currently utilizing Lovenox bridge until INR therapeutic   CKD stage IIIa Renal function is stable at time of discharge with range of 1.2-1.5   Hypothyroidism Continue usual Synthroid dose   Acute blood loss anemia / postop anemia Hemoglobin has stabilized at time of discharge at approximately 9.0   Physical deconditioning Will need SNF for rehab stay - anticipate d/c to SNF 10/31/22  Allergies as of 10/31/2022       Reactions   Plavix [clopidogrel Bisulfate]    Brain hemorrhage prev while on plavix and aspirin        Medication List     STOP taking these medications    amoxicillin-clavulanate 875-125 MG tablet Commonly known as: AUGMENTIN   augmented betamethasone dipropionate 0.05 % cream Commonly known as: DIPROLENE-AF   furosemide 40 MG tablet Commonly known as: LASIX   Jardiance 10 MG Tabs tablet Generic drug: empagliflozin   metoprolol succinate 25 MG 24 hr tablet Commonly known as: Toprol XL       TAKE these medications    acetaminophen 325 MG tablet Commonly known as: TYLENOL Take 2 tablets (650 mg total) by mouth every 6 (six) hours as needed for mild pain, fever or headache.   albuterol 108 (90 Base) MCG/ACT inhaler Commonly known as: VENTOLIN HFA Inhale 2 puffs into the lungs every 6 (six) hours as needed for wheezing or shortness of breath.   alum & mag hydroxide-simeth 200-200-20 MG/5ML suspension Commonly known as: MAALOX/MYLANTA Take 15-30 mLs by mouth every 2 (two) hours as needed for indigestion.   amiodarone 200 MG tablet Commonly known as: PACERONE TAKE 1 TABLET BY MOUTH TWICE A DAY   aspirin EC 81 MG tablet Take 1 tablet (81 mg total) by mouth daily at 6 (six) AM. Swallow whole. Start taking on: November 01, 2022   atorvastatin 20 MG tablet Commonly known as: LIPITOR TAKE 1 TABLET BY MOUTH EVERY DAY   enoxaparin 80 MG/0.8ML injection Commonly known as: LOVENOX Inject 0.675 mLs (67.5 mg total) into the skin every 12  (twelve) hours.   levothyroxine 150 MCG tablet Commonly known as: SYNTHROID TAKE ONE TABLET BY MOUTH EVERY DAY BEFORE BREAKFAST What changed: See the new instructions.   mometasone-formoterol 200-5 MCG/ACT Aero Commonly known as: DULERA Inhale 2 puffs into the lungs 2 (two) times daily.   saccharomyces boulardii 250 MG capsule Commonly known as: FLORASTOR Take 1 capsule (250 mg total) by mouth 2 (two) times daily for 7 days.   tamsulosin 0.4 MG Caps capsule Commonly known as: FLOMAX Take 1 capsule (0.4 mg total) by mouth daily.   warfarin 5 MG tablet Commonly known as: COUMADIN Take as directed. If you are unsure how to take this medication, talk to your nurse or doctor. Original instructions: Take 2.5 mg by mouth See admin instructions. Take 2.5 mg by mouth once daily except Saturday.        Day of Discharge BP (!) 122/49 (BP Location: Right Arm)   Pulse 66   Temp (!) 97.4 F (36.3 C) (Oral)   Resp 18   Ht 5\' 9"  (1.753 m)   Wt 68.2 kg   SpO2 98%   BMI 22.20 kg/m   Physical Exam: General: No acute respiratory distress Lungs: Clear to auscultation bilaterally without wheezes or crackles Cardiovascular: Regular rate and rhythm without murmur gallop or rub normal S1 and  S2 Abdomen: Nontender, nondistended, soft, bowel sounds positive, no rebound, no ascites, no appreciable mass Extremities: No significant cyanosis, clubbing, or edema bilateral lower extremities  Basic Metabolic Panel: Recent Labs  Lab 10/25/22 0517 10/26/22 0611 10/27/22 0544 10/28/22 0125 10/31/22 0122  NA 136 137  --  137 142  K 3.5 3.7  --  3.4* 4.7  CL 106 106  --  107 110  CO2 22 21*  --  21* 24  GLUCOSE 92 114*  --  103* 99  BUN 21 17  --  16 27*  CREATININE 1.54* 1.40*  --  1.26* 1.50*  CALCIUM 7.9* 8.0*  --  8.2* 8.5*  MG 2.2  --  2.2  --   --     CBC: Recent Labs  Lab 10/26/22 0611 10/27/22 0544 10/28/22 0125 10/29/22 0709 10/31/22 0122  WBC 13.3* 11.4* 10.0 7.6 11.0*   HGB 9.6* 8.7* 9.1* 8.7* 9.1*  HCT 31.9* 28.4* 30.4* 29.7* 31.2*  MCV 88.6 89.9 91.0 92.8 91.5  PLT 300 244 256 260 286    Time spent in discharge (includes decision making & examination of pt): 35 minutes  10/31/2022, 2:12 PM   [name], MD [facility] Office  [phone]

## 2022-10-29 NOTE — Progress Notes (Signed)
ANTICOAGULATION CONSULT NOTE  Pharmacy Consult for enoxaparin + warfarin Indication: critical limb ischemia, hx afib/DVT  Allergies  Allergen Reactions   Plavix [Clopidogrel Bisulfate]     Brain hemorrhage prev while on plavix and aspirin    Patient Measurements: Height: 5\' 9"  (175.3 cm) Weight: 68.2 kg (150 lb 5.7 oz) IBW/kg (Calculated) : 70.7 Heparin Dosing Weight: 68 kg  Vital Signs: Temp: 97.6 F (36.4 C) (07/06 0907) Temp Source: Oral (07/06 0907) BP: 124/54 (07/06 0907) Pulse Rate: 57 (07/06 0316)  Labs: Recent Labs    10/27/22 0544 10/28/22 0125 10/29/22 0709  HGB 8.7* 9.1* 8.7*  HCT 28.4* 30.4* 29.7*  PLT 244 256 260  LABPROT 18.4* 17.3* 17.0*  INR 1.5* 1.4* 1.4*  HEPARINUNFRC 0.39 0.43  --   CREATININE  --  1.26*  --      Estimated Creatinine Clearance: 44.4 mL/min (A) (by C-G formula based on SCr of 1.26 mg/dL (H)).   Assessment: 47 yom with hx afib/DVT s/p IVC filter on warfarin PTA presenting with critical limb ischemia. He is now s/p endarterectomy/bypass on 6/25 and s/p transmetatarsal amputation on 7/2.  Pharmacy dosing heparin and restarted warfarin on 7/3 (slow resumption per [facility]).  Warfarin PTA: 2.5mg /d except take none on Sat   INR is subtherapeutic at 1.4. No bleeding noted, Hgb low stable and platelets are normal.   Goal of Therapy:  INR 2-3 Heparin level 0.3-0.7 units/ml Monitor platelets by anticoagulation protocol: Yes   Plan:  Continue enoxaparin 1 mg/kg SQ q12h until INR >=2 Continue daily INR and every 72 hour CBC Continue warfarin 3 mg PO x 1 today Monitor for s/sx of bleeding  Thank you for involving pharmacy in this patient's care.  Blane Ohara, PharmD  PGY2 Pharmacy Resident

## 2022-10-29 NOTE — Progress Notes (Signed)
[name]  ZOX:[id number] DOB: 25-Mar-1941 DOA: 10/11/2022 PCP: [name], MD    Brief Narrative:  82 year old with a history of severe PAD, CAD, COPD, PAF, and DVT on chronic Coumadin (w/ IVC filter) who presented to the hospital 10/11/2022 with right foot gangrenous ulcers and surrounding infection.  During his hospitalization he underwent an endarterectomy in preparation for TMA, which was ultimately accomplished on 10/25/2022.  Postoperatively he encountered difficulty with persisting shock and required vasopressor support in the ICU.  He is subsequently stabilized and has been transitioned to the Hospitalist service.  Significant Events:  6/18 admitted right foot gangrene 6/19 INR elevated, Coumadin held and vitamin K given per surgery 6/20 Zosyn started, amiodarone, Lopressor, and Lasix on hold 6/21 abdominal aortogram with lower extremity runoff consistent with significant distal disease 6/25 underwent multiple right lower extremity endarterectomies with patch placement per vascular 6/26 remains in the ICU for pressor dependent, PCCM consulted 6/27 no acute issues overnight, remains on low-dose vasopressors peripherally 7/2 remains off of pressors. Taken to OR for R transmetatarsal amputation. 7/4 TRH resumed care of patient   Goals of Care:  Code Status: Full Code   DVT prophylaxis: lovenox > warfarin   Interim Hx: Continues to feel weak in general.  Complaining of shortness of breath above his baseline.  Feels anxious at times.  No chest pain nausea or vomiting.  Assessment & Plan:  Septic shock due to right lower extremity infection - resolved Has now completed an antibiotic course and is clinically stable with resolution of sepsis and shock state  Severe PAD with critical limb ischemia and gangrenous ulcers of the right foot Status post endarterectomies and TMA - continue aspirin and statin   Recurrent dyspnea Likely a component of low-grade COPD exacerbation  patient -does not appear to be volume overloaded and there is no significant pulmonary edema on CXR -no focal infiltrate or other clinical symptoms to suggest pneumonia -continue inhaled medications -dose with Solu-Medrol x 1  AAA 5.5 cm on recent imaging which is enlarged from previous imaging - outpatient follow-up with VVS  Prior Chronic diastolic and systolic CHF - recovered Prior reduced EF which has recovered, as well as normal diastolic function on TTE 10/21/2022 - net -3 L this admission -no evidence of significant volume overload on exam today  Severe aortic stenosis Will need outpatient follow-up to consider eventual intervention  Paroxysmal atrial fibrillation Rate controlled - on warfarin  CKD stage IIIa Renal function is stable/improving  Hypothyroidism Continue usual Synthroid dose  Acute blood loss anemia / postop anemia Hemoglobin appears to have stabilized at this time  Physical deconditioning Will need SNF for rehab stay - anticipate d/c to SNF 10/31/22  Family Communication: Spoke with wife at bedside Disposition: Will need SNF placement for rehab -anticipate discharge 10/31/2022   Objective: Blood pressure (!) 97/46, pulse (!) 57, temperature 97.6 F (36.4 C), temperature source Temporal, resp. rate 20, height 5\' 9"  (1.753 m), weight 68.2 kg, SpO2 98 %.  Intake/Output Summary (Last 24 hours) at 10/29/2022 1516 Last data filed at 10/28/2022 2200 Gross per 24 hour  Intake 120 ml  Output 950 ml  Net -830 ml    Filed Weights   10/25/22 0500 10/25/22 0904 10/26/22 0600  Weight: 68.5 kg 68.5 kg 68.2 kg    Examination: General: No acute respiratory distress - reports dyspnea Lungs: Mild diffuse expiratory wheezing without change with no focal crackles Cardiovascular: Regular rate without rub Abdomen: Nontender, nondistended, soft, bowel sounds positive,  no rebound, no ascites Extremities: No significant edema bilateral lower extremities - right foot dressing  intact and dry  CBC: Recent Labs  Lab 10/27/22 0544 10/28/22 0125 10/29/22 0709  WBC 11.4* 10.0 7.6  HGB 8.7* 9.1* 8.7*  HCT 28.4* 30.4* 29.7*  MCV 89.9 91.0 92.8  PLT 244 256 260    Basic Metabolic Panel: Recent Labs  Lab 10/25/22 0517 10/26/22 0611 10/27/22 0544 10/28/22 0125  NA 136 137  --  137  K 3.5 3.7  --  3.4*  CL 106 106  --  107  CO2 22 21*  --  21*  GLUCOSE 92 114*  --  103*  BUN 21 17  --  16  CREATININE 1.54* 1.40*  --  1.26*  CALCIUM 7.9* 8.0*  --  8.2*  MG 2.2  --  2.2  --     GFR: Estimated Creatinine Clearance: 44.4 mL/min (A) (by C-G formula based on SCr of 1.26 mg/dL (H)).   Scheduled Meds:  amiodarone  200 mg Oral BID   aspirin EC  81 mg Oral Q0600   atorvastatin  20 mg Oral Daily   bethanechol  10 mg Oral TID   Chlorhexidine Gluconate Cloth  6 each Topical Daily   enoxaparin (LOVENOX) injection  1 mg/kg Subcutaneous Q12H   feeding supplement  1 Container Oral TID BM   Gerhardt's butt cream  1 Application Topical BID   levothyroxine  150 mcg Oral Q0600   midodrine  10 mg Oral BID WC   mometasone-formoterol  2 puff Inhalation BID   pantoprazole  40 mg Oral Daily   pregabalin  50 mg Oral Daily   saccharomyces boulardii  250 mg Oral BID   tamsulosin  0.4 mg Oral Daily   warfarin  3 mg Oral ONCE-1600   Warfarin - Pharmacist Dosing Inpatient   Does not apply q1600     LOS: 18 days   [name], MD [facility] Office  [phone] Pager - Text Page per Loretha Stapler  If 7PM-7AM, please contact night-coverage per Amion 10/29/2022, 3:16 PM

## 2022-10-30 LAB — PROTIME-INR
INR: 1.5 — ABNORMAL HIGH (ref 0.8–1.2)
Prothrombin Time: 17.9 seconds — ABNORMAL HIGH (ref 11.4–15.2)

## 2022-10-30 MED ORDER — WARFARIN SODIUM 2 MG PO TABS
3.0000 mg | ORAL_TABLET | Freq: Once | ORAL | Status: AC
  Administered 2022-10-30: 3 mg via ORAL
  Filled 2022-10-30: qty 1

## 2022-10-30 MED ORDER — MIDODRINE HCL 5 MG PO TABS
5.0000 mg | ORAL_TABLET | Freq: Two times a day (BID) | ORAL | Status: DC
  Administered 2022-10-30 (×2): 5 mg via ORAL
  Filled 2022-10-30 (×2): qty 1

## 2022-10-30 NOTE — Progress Notes (Signed)
ANTICOAGULATION CONSULT NOTE  Pharmacy Consult for enoxaparin + warfarin Indication: critical limb ischemia, hx afib/DVT  Allergies  Allergen Reactions   Plavix [Clopidogrel Bisulfate]     Brain hemorrhage prev while on plavix and aspirin    Patient Measurements: Height: 5\' 9"  (175.3 cm) Weight: 68.2 kg (150 lb 5.7 oz) IBW/kg (Calculated) : 70.7 Heparin Dosing Weight: 68 kg  Vital Signs: Temp: 97.6 F (36.4 C) (07/07 0844) Temp Source: Oral (07/07 0844) BP: 125/54 (07/07 0844) Pulse Rate: 66 (07/07 0844)  Labs: Recent Labs    10/28/22 0125 10/29/22 0709 10/30/22 0122  HGB 9.1* 8.7*  --   HCT 30.4* 29.7*  --   PLT 256 260  --   LABPROT 17.3* 17.0* 17.9*  INR 1.4* 1.4* 1.5*  HEPARINUNFRC 0.43  --   --   CREATININE 1.26*  --   --      Estimated Creatinine Clearance: 44.4 mL/min (A) (by C-G formula based on SCr of 1.26 mg/dL (H)).   Assessment: 44 yom with hx afib/DVT s/p IVC filter on warfarin PTA presenting with critical limb ischemia. He is now s/p endarterectomy/bypass on 6/25 and s/p transmetatarsal amputation on 7/2.  Pharmacy dosing heparin and restarted warfarin on 7/3 (slow resumption per VVS).  Warfarin PTA: 2.5mg /d except take none on Sat   INR is subtherapeutic at 1.5. No bleeding noted, Hgb low stable and platelets are normal. Expect to see an increase in INR tomorrow.   Goal of Therapy:  INR 2-3 Heparin level 0.3-0.7 units/ml Monitor platelets by anticoagulation protocol: Yes   Plan:  Continue enoxaparin 1 mg/kg SQ q12h until INR >=2 Continue daily INR and every 72 hour CBC Continue warfarin 3 mg PO x 1 today Monitor for s/sx of bleeding  Thank you for involving pharmacy in this patient's care.  Blane Ohara, PharmD  PGY2 Pharmacy Resident

## 2022-10-30 NOTE — Progress Notes (Signed)
[name]  WUJ:[id number] DOB: [birth year] DOA: 10/11/2022 PCP: [name], MD    Brief Narrative:  82 year old with a history of severe PAD, CAD, COPD, PAF, and DVT on chronic Coumadin (w/ IVC filter) who presented to the hospital 10/11/2022 with right foot gangrenous ulcers and surrounding infection.  During his hospitalization he underwent an endarterectomy in preparation for TMA, which was ultimately accomplished on 10/25/2022.  Postoperatively he encountered difficulty with persisting shock and required vasopressor support in the ICU.  He is subsequently stabilized and has been transitioned to the Hospitalist service.  Significant Events:  6/18 admitted right foot gangrene 6/19 INR elevated, Coumadin held and vitamin K given per surgery 6/20 Zosyn started, amiodarone, Lopressor, and Lasix on hold 6/21 abdominal aortogram with lower extremity runoff consistent with significant distal disease 6/25 underwent multiple right lower extremity endarterectomies with patch placement per vascular 6/26 remains in the ICU for pressor dependent, PCCM consulted 6/27 no acute issues overnight, remains on low-dose vasopressors peripherally 7/2 remains off of pressors. Taken to OR for R transmetatarsal amputation. 7/4 TRH resumed care of patient   Goals of Care:  Code Status: Full Code   DVT prophylaxis: lovenox > warfarin   Interim Hx: No acute events recorded overnight.  Afebrile.  Vital signs stable.  Feeling much better today.  Successfully weaned to room air.  Less short of breath.  No new complaints otherwise.  Assessment & Plan:  Septic shock due to right lower extremity infection - resolved Has now completed an antibiotic course and is clinically stable with resolution of sepsis and shock state  Severe PAD with critical limb ischemia and gangrenous ulcers of the right foot Status post endarterectomies and R TMA 10/25/22 - continue aspirin and statin - postop care per Vascular  Surgery  Recurrent dyspnea - low grade acute COPD Exacerbation  does not appear to be volume overloaded and there is no significant pulmonary edema on CXR - no focal infiltrate or other clinical symptoms to suggest pneumonia - continue inhaled medications - dosed with Solu-Medrol for short course, resulting in significant improvement in resp dx and resolved wheezing   AAA 5.5 cm on recent imaging which is enlarged from previous imaging - outpatient follow-up with VVS  Prior Chronic diastolic and systolic CHF - recovered Prior reduced EF which has recovered, as well as normal diastolic function on TTE 10/21/2022 - net -3.6 L this admission - no evidence of significant volume overload on exam today  Severe aortic stenosis Will need outpatient follow-up to consider eventual intervention  Paroxysmal atrial fibrillation Rate controlled - on warfarin with Lovenox bridge until INR therapeutic  CKD stage IIIa Renal function is stable/improving  Hypothyroidism Continue usual Synthroid dose  Acute blood loss anemia / postop anemia Hemoglobin appears to have stabilized at this time  Physical deconditioning Will need SNF for rehab stay - anticipate d/c to SNF 10/31/22  Family Communication: Spoke with wife at bedside Disposition: Will need SNF placement for rehab -anticipate discharge 10/31/2022   Objective: Blood pressure (!) 125/54, pulse 66, temperature 97.6 F (36.4 C), temperature source Oral, resp. rate 18, height 5\' 9"  (1.753 m), weight 68.2 kg, SpO2 95 %.  Intake/Output Summary (Last 24 hours) at 10/30/2022 1507 Last data filed at 10/30/2022 0600 Gross per 24 hour  Intake --  Output 1000 ml  Net -1000 ml   Filed Weights   10/25/22 0500 10/25/22 0904 10/26/22 0600  Weight: 68.5 kg 68.5 kg 68.2 kg    Examination: General:  No acute distress with respirations unlabored Lungs: Wheezing resolved with no focal crackles and good air movement throughout Cardiovascular: Regular rate  without rub Abdomen: Nontender, nondistended, soft, bowel sounds positive, no rebound, no ascites Extremities: No significant edema bilateral lower extremities - right foot dressing intact and dry  CBC: Recent Labs  Lab 10/27/22 0544 10/28/22 0125 10/29/22 0709  WBC 11.4* 10.0 7.6  HGB 8.7* 9.1* 8.7*  HCT 28.4* 30.4* 29.7*  MCV 89.9 91.0 92.8  PLT 244 256 260   Basic Metabolic Panel: Recent Labs  Lab 10/25/22 0517 10/26/22 0611 10/27/22 0544 10/28/22 0125  NA 136 137  --  137  K 3.5 3.7  --  3.4*  CL 106 106  --  107  CO2 22 21*  --  21*  GLUCOSE 92 114*  --  103*  BUN 21 17  --  16  CREATININE 1.54* 1.40*  --  1.26*  CALCIUM 7.9* 8.0*  --  8.2*  MG 2.2  --  2.2  --    GFR: Estimated Creatinine Clearance: 44.4 mL/min (A) (by C-G formula based on SCr of 1.26 mg/dL (H)).   Scheduled Meds:  amiodarone  200 mg Oral BID   aspirin EC  81 mg Oral Q0600   atorvastatin  20 mg Oral Daily   Chlorhexidine Gluconate Cloth  6 each Topical Daily   enoxaparin (LOVENOX) injection  1 mg/kg Subcutaneous Q12H   feeding supplement  1 Container Oral TID BM   Gerhardt's butt cream  1 Application Topical BID   levothyroxine  150 mcg Oral Q0600   midodrine  5 mg Oral BID WC   mometasone-formoterol  2 puff Inhalation BID   pantoprazole  40 mg Oral Daily   pregabalin  50 mg Oral Daily   saccharomyces boulardii  250 mg Oral BID   tamsulosin  0.4 mg Oral Daily   warfarin  3 mg Oral ONCE-1600   Warfarin - Pharmacist Dosing Inpatient   Does not apply q1600     LOS: 19 days   [name], MD [facility] Office  [phone] Pager - Text Page per Loretha Stapler  If 7PM-7AM, please contact night-coverage per Amion 10/30/2022, 3:07 PM

## 2022-10-31 DIAGNOSIS — L97501 Non-pressure chronic ulcer of other part of unspecified foot limited to breakdown of skin: Secondary | ICD-10-CM | POA: Diagnosis not present

## 2022-10-31 DIAGNOSIS — J9622 Acute and chronic respiratory failure with hypercapnia: Secondary | ICD-10-CM | POA: Diagnosis not present

## 2022-10-31 DIAGNOSIS — E870 Hyperosmolality and hypernatremia: Secondary | ICD-10-CM | POA: Diagnosis not present

## 2022-10-31 DIAGNOSIS — R000 Tachycardia, unspecified: Secondary | ICD-10-CM | POA: Diagnosis not present

## 2022-10-31 DIAGNOSIS — R6520 Severe sepsis without septic shock: Secondary | ICD-10-CM | POA: Diagnosis not present

## 2022-10-31 DIAGNOSIS — Y95 Nosocomial condition: Secondary | ICD-10-CM | POA: Diagnosis present

## 2022-10-31 DIAGNOSIS — I714 Abdominal aortic aneurysm, without rupture, unspecified: Secondary | ICD-10-CM | POA: Diagnosis not present

## 2022-10-31 DIAGNOSIS — D649 Anemia, unspecified: Secondary | ICD-10-CM | POA: Diagnosis not present

## 2022-10-31 DIAGNOSIS — Z66 Do not resuscitate: Secondary | ICD-10-CM | POA: Diagnosis not present

## 2022-10-31 DIAGNOSIS — N1831 Chronic kidney disease, stage 3a: Secondary | ICD-10-CM | POA: Diagnosis not present

## 2022-10-31 DIAGNOSIS — D849 Immunodeficiency, unspecified: Secondary | ICD-10-CM | POA: Diagnosis not present

## 2022-10-31 DIAGNOSIS — E8729 Other acidosis: Secondary | ICD-10-CM | POA: Diagnosis not present

## 2022-10-31 DIAGNOSIS — D500 Iron deficiency anemia secondary to blood loss (chronic): Secondary | ICD-10-CM | POA: Diagnosis not present

## 2022-10-31 DIAGNOSIS — J9691 Respiratory failure, unspecified with hypoxia: Secondary | ICD-10-CM | POA: Diagnosis not present

## 2022-10-31 DIAGNOSIS — J90 Pleural effusion, not elsewhere classified: Secondary | ICD-10-CM | POA: Diagnosis not present

## 2022-10-31 DIAGNOSIS — R278 Other lack of coordination: Secondary | ICD-10-CM | POA: Diagnosis not present

## 2022-10-31 DIAGNOSIS — I959 Hypotension, unspecified: Secondary | ICD-10-CM | POA: Diagnosis not present

## 2022-10-31 DIAGNOSIS — I739 Peripheral vascular disease, unspecified: Secondary | ICD-10-CM | POA: Diagnosis not present

## 2022-10-31 DIAGNOSIS — J189 Pneumonia, unspecified organism: Secondary | ICD-10-CM | POA: Diagnosis not present

## 2022-10-31 DIAGNOSIS — R41841 Cognitive communication deficit: Secondary | ICD-10-CM | POA: Diagnosis not present

## 2022-10-31 DIAGNOSIS — E039 Hypothyroidism, unspecified: Secondary | ICD-10-CM | POA: Diagnosis not present

## 2022-10-31 DIAGNOSIS — I70221 Atherosclerosis of native arteries of extremities with rest pain, right leg: Secondary | ICD-10-CM | POA: Diagnosis not present

## 2022-10-31 DIAGNOSIS — J9601 Acute respiratory failure with hypoxia: Secondary | ICD-10-CM | POA: Diagnosis not present

## 2022-10-31 DIAGNOSIS — R051 Acute cough: Secondary | ICD-10-CM | POA: Diagnosis not present

## 2022-10-31 DIAGNOSIS — I5032 Chronic diastolic (congestive) heart failure: Secondary | ICD-10-CM | POA: Diagnosis not present

## 2022-10-31 DIAGNOSIS — R2689 Other abnormalities of gait and mobility: Secondary | ICD-10-CM | POA: Diagnosis not present

## 2022-10-31 DIAGNOSIS — D696 Thrombocytopenia, unspecified: Secondary | ICD-10-CM | POA: Diagnosis not present

## 2022-10-31 DIAGNOSIS — M6281 Muscle weakness (generalized): Secondary | ICD-10-CM | POA: Diagnosis not present

## 2022-10-31 DIAGNOSIS — R0602 Shortness of breath: Secondary | ICD-10-CM | POA: Diagnosis not present

## 2022-10-31 DIAGNOSIS — J9621 Acute and chronic respiratory failure with hypoxia: Secondary | ICD-10-CM | POA: Diagnosis not present

## 2022-10-31 DIAGNOSIS — E162 Hypoglycemia, unspecified: Secondary | ICD-10-CM | POA: Diagnosis not present

## 2022-10-31 DIAGNOSIS — I2510 Atherosclerotic heart disease of native coronary artery without angina pectoris: Secondary | ICD-10-CM | POA: Diagnosis not present

## 2022-10-31 DIAGNOSIS — I517 Cardiomegaly: Secondary | ICD-10-CM | POA: Diagnosis not present

## 2022-10-31 DIAGNOSIS — I350 Nonrheumatic aortic (valve) stenosis: Secondary | ICD-10-CM | POA: Diagnosis not present

## 2022-10-31 DIAGNOSIS — R54 Age-related physical debility: Secondary | ICD-10-CM | POA: Diagnosis not present

## 2022-10-31 DIAGNOSIS — R0689 Other abnormalities of breathing: Secondary | ICD-10-CM | POA: Diagnosis not present

## 2022-10-31 DIAGNOSIS — N1832 Chronic kidney disease, stage 3b: Secondary | ICD-10-CM | POA: Diagnosis not present

## 2022-10-31 DIAGNOSIS — R0902 Hypoxemia: Secondary | ICD-10-CM | POA: Diagnosis not present

## 2022-10-31 DIAGNOSIS — J1282 Pneumonia due to coronavirus disease 2019: Secondary | ICD-10-CM | POA: Diagnosis not present

## 2022-10-31 DIAGNOSIS — I5043 Acute on chronic combined systolic (congestive) and diastolic (congestive) heart failure: Secondary | ICD-10-CM | POA: Diagnosis not present

## 2022-10-31 DIAGNOSIS — R4182 Altered mental status, unspecified: Secondary | ICD-10-CM | POA: Diagnosis not present

## 2022-10-31 DIAGNOSIS — J440 Chronic obstructive pulmonary disease with acute lower respiratory infection: Secondary | ICD-10-CM | POA: Diagnosis not present

## 2022-10-31 DIAGNOSIS — Z7401 Bed confinement status: Secondary | ICD-10-CM | POA: Diagnosis not present

## 2022-10-31 DIAGNOSIS — J441 Chronic obstructive pulmonary disease with (acute) exacerbation: Secondary | ICD-10-CM | POA: Diagnosis not present

## 2022-10-31 DIAGNOSIS — L89152 Pressure ulcer of sacral region, stage 2: Secondary | ICD-10-CM | POA: Diagnosis not present

## 2022-10-31 DIAGNOSIS — R918 Other nonspecific abnormal finding of lung field: Secondary | ICD-10-CM | POA: Diagnosis not present

## 2022-10-31 DIAGNOSIS — I130 Hypertensive heart and chronic kidney disease with heart failure and stage 1 through stage 4 chronic kidney disease, or unspecified chronic kidney disease: Secondary | ICD-10-CM | POA: Diagnosis not present

## 2022-10-31 DIAGNOSIS — R531 Weakness: Secondary | ICD-10-CM | POA: Diagnosis not present

## 2022-10-31 DIAGNOSIS — J159 Unspecified bacterial pneumonia: Secondary | ICD-10-CM | POA: Diagnosis not present

## 2022-10-31 DIAGNOSIS — I480 Paroxysmal atrial fibrillation: Secondary | ICD-10-CM | POA: Diagnosis not present

## 2022-10-31 DIAGNOSIS — I70235 Atherosclerosis of native arteries of right leg with ulceration of other part of foot: Secondary | ICD-10-CM | POA: Diagnosis not present

## 2022-10-31 DIAGNOSIS — J449 Chronic obstructive pulmonary disease, unspecified: Secondary | ICD-10-CM | POA: Diagnosis not present

## 2022-10-31 DIAGNOSIS — I499 Cardiac arrhythmia, unspecified: Secondary | ICD-10-CM | POA: Diagnosis not present

## 2022-10-31 DIAGNOSIS — I10 Essential (primary) hypertension: Secondary | ICD-10-CM | POA: Diagnosis not present

## 2022-10-31 DIAGNOSIS — R0603 Acute respiratory distress: Secondary | ICD-10-CM | POA: Diagnosis not present

## 2022-10-31 DIAGNOSIS — U071 COVID-19: Secondary | ICD-10-CM | POA: Diagnosis not present

## 2022-10-31 LAB — CBC
HCT: 31.2 % — ABNORMAL LOW (ref 39.0–52.0)
Hemoglobin: 9.1 g/dL — ABNORMAL LOW (ref 13.0–17.0)
MCH: 26.7 pg (ref 26.0–34.0)
MCHC: 29.2 g/dL — ABNORMAL LOW (ref 30.0–36.0)
MCV: 91.5 fL (ref 80.0–100.0)
Platelets: 286 10*3/uL (ref 150–400)
RBC: 3.41 MIL/uL — ABNORMAL LOW (ref 4.22–5.81)
RDW: 21.1 % — ABNORMAL HIGH (ref 11.5–15.5)
WBC: 11 10*3/uL — ABNORMAL HIGH (ref 4.0–10.5)
nRBC: 0.2 % (ref 0.0–0.2)

## 2022-10-31 LAB — GLUCOSE, CAPILLARY: Glucose-Capillary: 115 mg/dL — ABNORMAL HIGH (ref 70–99)

## 2022-10-31 LAB — BASIC METABOLIC PANEL
Anion gap: 8 (ref 5–15)
BUN: 27 mg/dL — ABNORMAL HIGH (ref 8–23)
CO2: 24 mmol/L (ref 22–32)
Calcium: 8.5 mg/dL — ABNORMAL LOW (ref 8.9–10.3)
Chloride: 110 mmol/L (ref 98–111)
Creatinine, Ser: 1.5 mg/dL — ABNORMAL HIGH (ref 0.61–1.24)
GFR, Estimated: 46 mL/min — ABNORMAL LOW (ref >60)
Glucose, Bld: 99 mg/dL (ref 70–99)
Potassium: 4.7 mmol/L (ref 3.5–5.1)
Sodium: 142 mmol/L (ref 135–145)

## 2022-10-31 LAB — PROTIME-INR
INR: 1.6 — ABNORMAL HIGH (ref 0.8–1.2)
Prothrombin Time: 19 seconds — ABNORMAL HIGH (ref 11.4–15.2)

## 2022-10-31 MED ORDER — ENOXAPARIN SODIUM 80 MG/0.8ML IJ SOSY: 1.0000 mg/kg | PREFILLED_SYRINGE | Freq: Two times a day (BID) | INTRAMUSCULAR | Status: DC

## 2022-10-31 MED ORDER — PREGABALIN 50 MG PO CAPS: 50.0000 mg | ORAL_CAPSULE | Freq: Every day | ORAL | Status: DC

## 2022-10-31 MED ORDER — ACETAMINOPHEN 325 MG PO TABS: 650.0000 mg | ORAL_TABLET | Freq: Four times a day (QID) | ORAL | Status: DC | PRN

## 2022-10-31 MED ORDER — ALUM & MAG HYDROXIDE-SIMETH 200-200-20 MG/5ML PO SUSP: 15.0000 mL | ORAL | 0 refills | Status: DC | PRN

## 2022-10-31 MED ORDER — SACCHAROMYCES BOULARDII 250 MG PO CAPS: 250.0000 mg | ORAL_CAPSULE | Freq: Two times a day (BID) | ORAL | Status: DC

## 2022-10-31 MED ORDER — TAMSULOSIN HCL 0.4 MG PO CAPS: 0.4000 mg | ORAL_CAPSULE | Freq: Every day | ORAL | Status: DC

## 2022-10-31 MED ORDER — MOMETASONE FURO-FORMOTEROL FUM 200-5 MCG/ACT IN AERO: 2.0000 | INHALATION_SPRAY | Freq: Two times a day (BID) | RESPIRATORY_TRACT | Status: DC

## 2022-10-31 MED ORDER — WARFARIN SODIUM 4 MG PO TABS
4.0000 mg | ORAL_TABLET | Freq: Once | ORAL | Status: AC
  Administered 2022-10-31: 4 mg via ORAL
  Filled 2022-10-31: qty 1

## 2022-10-31 MED ORDER — ASPIRIN 81 MG PO TBEC: 81.0000 mg | DELAYED_RELEASE_TABLET | Freq: Every day | ORAL | 12 refills | Status: DC

## 2022-10-31 MED ORDER — OXYCODONE HCL 5 MG PO TABS: 5.0000 mg | ORAL_TABLET | Freq: Four times a day (QID) | ORAL | Status: DC | PRN

## 2022-10-31 NOTE — Discharge Instructions (Signed)
 [facility]  Discharge instructions  Lower Extremity Bypass Surgery  Please refer to the following instruction for your post-procedure care. Your surgeon or physician assistant will discuss any changes with you.  Activity  You are encouraged to walk as much as you can. You can slowly return to normal activities during the month after your surgery. Avoid strenuous activity and heavy lifting until your doctor tells you it's OK. Avoid activities such as vacuuming or swinging a golf club. Do not drive until your doctor give the OK and you are no longer taking prescription pain medications. It is also normal to have difficulty with sleep habits, eating and bowel movement after surgery. These will go away with time.  Bathing/Showering  You may shower after you go home. Do not soak in a bathtub, hot tub, or swim until the incision heals completely.  Incision Care  Clean your incision with mild soap and water. Shower every day. Pat the area dry with a clean towel. You do not need a bandage unless otherwise instructed. Do not apply any ointments or creams to your incision. If you have open wounds you will be instructed how to care for them or a visiting nurse may be arranged for you. If you have staples or sutures along your incision they will be removed at your post-op appointment. You may have skin glue on your incision. Do not peel it off. It will come off on its own in about one week. If you have a great deal of moisture in your groin, use a gauze help keep this area dry.  Diet  Resume your normal diet. There are no special food restrictions following this procedure. A low fat/ low cholesterol diet is recommended for all patients with vascular disease. In order to heal from your surgery, it is CRITICAL to get adequate nutrition. Your body requires vitamins, minerals, and protein. Vegetables are the best source of vitamins and minerals. Vegetables also provide the  perfect balance of protein. Processed food has little nutritional value, so try to avoid this.  Medications  Resume taking all your medications unless your doctor or nurse practitioner tells you not to. If your incision is causing pain, you may take over-the-counter pain relievers such as acetaminophen (Tylenol). If you were prescribed a stronger pain medication, please aware these medication can cause nausea and constipation. Prevent nausea by taking the medication with a snack or meal. Avoid constipation by drinking plenty of fluids and eating foods with high amount of fiber, such as fruits, vegetables, and grains. Take Colase 100 mg (an over-the-counter stool softener) twice a day as needed for constipation. Do not take Tylenol if you are taking prescription pain medications.  Follow Up  Our office will schedule a follow up appointment 2-3 weeks following discharge.  Please call us immediately for any of the following conditions  Severe or worsening pain in your legs or feet while at rest or while walking Increase pain, redness, warmth, or drainage (pus) from your incision site(s) Fever of 101 degree or higher The swelling in your leg with the bypass suddenly worsens and becomes more painful than when you were in the hospital If you have been instructed to feel your graft pulse then you should do so every day. If you can no longer feel this pulse, call the office immediately. Not all patients are given this instruction.  Leg swelling is common after leg bypass surgery.  The swelling should improve over a few months   following surgery. To improve the swelling, you may elevate your legs above the level of your heart while you are sitting or resting. Your surgeon or physician assistant may ask you to apply an ACE wrap or wear compression (TED) stockings to help to reduce swelling.  Reduce your risk of vascular disease  Stop smoking. If you would like help call [facility] at 1-800-QUIT-NOW  (1-800-784-8669) or [place] at [phone].  Manage your cholesterol Maintain a desired weight Control your diabetes weight Control your diabetes Keep your blood pressure down  If you have any questions, please call the office at [phone]   

## 2022-10-31 NOTE — Plan of Care (Signed)
  Problem: Skin Integrity: Goal: Risk for impaired skin integrity will decrease Outcome: Progressing   Problem: Pain Managment: Goal: General experience of comfort will improve Outcome: Progressing   Problem: Safety: Goal: Ability to remain free from injury will improve Outcome: Progressing   

## 2022-10-31 NOTE — TOC Progression Note (Signed)
Transition of Care [facility]) - Progression Note    Patient Details  Name: [name] MRN: [id number] Date of Birth: 1940/11/09  [facility]) CM/SW Contact  Eduard Roux, [place] Phone Number: 10/31/2022, 2:49 PM  Clinical Narrative:     sent updated d/c summary to SNF  Expected Discharge Plan: Skilled Nursing Facility Barriers to Discharge: Barriers Resolved  Expected Discharge Plan and Services In-house Referral: Clinical Social Work Discharge Planning Services: CM Consult Post Acute Care Choice: Home Health Living arrangements for the past 2 months: Single Family Home Expected Discharge Date: 10/31/22                           [facility] The Agency: [facility]         Social Determinants of Health (SDOH) Interventions SDOH Screenings   Food Insecurity: No Food Insecurity (10/14/2022)  Housing: Low Risk  (10/14/2022)  Transportation Needs: No Transportation Needs (10/14/2022)  Utilities: Not At Risk (10/14/2022)  Alcohol Screen: Low Risk  (07/13/2022)  Depression (PHQ2-9): Low Risk  (07/13/2022)  Financial Resource Strain: Low Risk  (07/13/2022)  Physical Activity: Insufficiently Active (07/13/2022)  Social Connections: Moderately Integrated (07/13/2022)  Stress: No Stress Concern Present (07/13/2022)  Tobacco Use: Medium Risk (10/26/2022)    Readmission Risk Interventions     No data to display

## 2022-10-31 NOTE — Progress Notes (Signed)
AVS and social worker's paperwork placed in discharge packet. Report called and given to Judeth Cornfield, LPN at [facility]. All questions answered to satisfaction. Awaiting [facility] to transport pt with all belongings.

## 2022-10-31 NOTE — Progress Notes (Addendum)
[facility]  Subjective  - No new complaints   Objective (!) 135/51 66 98.6 F (37 C) (Oral) (!) 21 96% No intake or output data in the 24 hours ending 10/31/22 0724     Well healing TMA, right groin and lower leg  Doppler DP/PT intact Lungs non labored breathing   Assessment/Planning: POD # Right femoral to below-knee popliteal artery bypass (PTFE)   TMA right LE He has DP/PT doppler signals and incisions are healing well.  TMA appears viable F/U 4 weeks from TMA for staple removal with ABI's  Mosetta Pigeon 10/31/2022 7:24 AM --  Laboratory Lab Results: Recent Labs    10/29/22 0709 10/31/22 0122  WBC 7.6 11.0*  HGB 8.7* 9.1*  HCT 29.7* 31.2*  PLT 260 286   BMET Recent Labs    10/31/22 0122  NA 142  K 4.7  CL 110  CO2 24  GLUCOSE 99  BUN 27*  CREATININE 1.50*  CALCIUM 8.5*    COAG Lab Results  Component Value Date   INR 1.6 (H) 10/31/2022   INR 1.5 (H) 10/30/2022   INR 1.4 (H) 10/29/2022   No results found for: "PTT"  I have independently interviewed and examined patient and agree with PA assessment and plan above.   Challis Crill C. Randie Heinz, MD [facility] [place] Office: [phone] Pager: [phone]

## 2022-10-31 NOTE — Progress Notes (Signed)
Pt's wife concerned about pt taking Lyrica, stating it makes him "not clear-headed". Jetty Duhamel, MD notified. See new order for discontinuation of med.

## 2022-10-31 NOTE — Progress Notes (Addendum)
ANTICOAGULATION CONSULT NOTE - Follow Up Consult  Pharmacy Consult for enoxaparin + warfarin Indication:  critical limb ischemia, hx afib/DVT  Allergies  Allergen Reactions   Plavix [Clopidogrel Bisulfate]     Brain hemorrhage prev while on plavix and aspirin    Patient Measurements: Height: 5\' 9"  (175.3 cm) Weight: 68.2 kg (150 lb 5.7 oz) IBW/kg (Calculated) : 70.7 Heparin Dosing Weight: 68 kg  Vital Signs: Temp: 97.6 F (36.4 C) (07/08 0759) Temp Source: Oral (07/08 0759) BP: 125/53 (07/08 0759)  Labs: Recent Labs    10/29/22 0709 10/30/22 0122 10/31/22 0122  HGB 8.7*  --  9.1*  HCT 29.7*  --  31.2*  PLT 260  --  286  LABPROT 17.0* 17.9* 19.0*  INR 1.4* 1.5* 1.6*  CREATININE  --   --  1.50*    Estimated Creatinine Clearance: 37.3 mL/min (A) (by C-G formula based on SCr of 1.5 mg/dL (H)).   Medications:  Scheduled:   amiodarone  200 mg Oral BID   aspirin EC  81 mg Oral Q0600   atorvastatin  20 mg Oral Daily   Chlorhexidine Gluconate Cloth  6 each Topical Daily   enoxaparin (LOVENOX) injection  1 mg/kg Subcutaneous Q12H   feeding supplement  1 Container Oral TID BM   Gerhardt's butt cream  1 Application Topical BID   levothyroxine  150 mcg Oral Q0600   mometasone-formoterol  2 puff Inhalation BID   pantoprazole  40 mg Oral Daily   pregabalin  50 mg Oral Daily   saccharomyces boulardii  250 mg Oral BID   tamsulosin  0.4 mg Oral Daily   warfarin  4 mg Oral ONCE-1600   Warfarin - Pharmacist Dosing Inpatient   Does not apply q1600    Assessment: 52 yom with hx afib/DVT s/p IVC filter on warfarin PTA presenting with critical limb ischemia. He is now s/p endarterectomy/bypass on 6/25 and s/p transmetatarsal amputation on 7/2.  Pharmacy dosing enoxaparin and restarted warfarin on 7/3 (slow resumption per VVS).  Warfarin PTA: 2.5mg /d except take none on Sat    INR is subtherapeutic at 1.6. No bleeding noted, Hgb low stable and platelets are normal. Given INR  subtherapeutic x 3 days, will increase dose slightly today. Patient to discharge to SNF on PTA warfarin dosing.   Goal of Therapy:  INR 2-3 Heparin level 0.3-0.7 units/ml Monitor platelets by anticoagulation protocol: Yes   Plan:  Continue enoxaparin 1 mg/kg SQ q12h until INR >= 2 Continue daily INR and every 72 hour CBC Increase warfarin to 4 mg PO x 1 dose today Monitor s/sx of bleeding   Lennie Muckle, PharmD PGY1 Acute Care Resident 10/31/2022 8:18 AM

## 2022-10-31 NOTE — Progress Notes (Signed)
Physical Therapy Treatment Patient Details Name: [name] MRN: [id number] DOB: 1940-10-02 Today's Date: 10/31/2022   History of Present Illness 82 yo m referred to ED 6/18 by physician with critical limb ischemia. He is now s/p endarterectomy/bypass on 6/25, s/p R foot transmet amputation on 7/2. 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    PT Comments  Pt seen for PT tx with pt agreeable, wife [name]) present for session. Pt on 2L/min via nasal cannula throughout session; pt does hold breath & reports feeling dizzy after supine>sit but SpO2 >90%, PT educates pt on pursed lip breathing. Pt is able to transition to sitting EOB with min assist on this date with heavy reliance on hospital bed features. Pt tolerates sitting EOB ~10 minutes, donning dentures without LOB. Pt performs lateral scoot bed>recliner with mod assist with multimodal cuing for head/hips relationship & sequencing. Pt fatigues quickly throughout session. Reviewed use of incentive spirometer, anticipated rehab, and importance of OOB mobility. Wife voicing concerns re: pt's medications & trembling hands - nurse & MD notified.     Assistance Recommended at Discharge Frequent or constant Supervision/Assistance  If plan is discharge home, recommend the following:  Can travel by private vehicle    A lot of help with bathing/dressing/bathroom;Assistance with cooking/housework;Direct supervision/assist for medications management;Direct supervision/assist for financial management;Assist for transportation;Help with stairs or ramp for entrance;Two people to help with walking and/or transfers   No  Equipment Recommendations  None recommended by PT     Recommendations for Other Services       Precautions / Restrictions Precautions Precautions: Fall Precaution Comments: watch SpO2 (pt holds breath) Required Braces or Orthoses: Other Brace Other Brace: Darco shoe Restrictions Weight Bearing Restrictions: Yes RLE Weight Bearing: Weight bearing as tolerated RLE Partial Weight Bearing Percentage or Pounds: heel weight bearing in darco boot     Mobility  Bed Mobility Overal bed mobility: Needs Assistance Bed Mobility: Supine to Sit     Supine to sit: Min assist, HOB elevated (extra time to upright trunk, HOB elevated, bed rails with cuing for technique)     General bed mobility comments: Cuing for lateral leans to increase increase of scooting to sitting EOB; mod assist to complete. Pt with increased ease of leaning L to scoot R hip forward compared to leaning R to scoot L hip forward.    Transfers Overall transfer level: Needs assistance Equipment used: None Transfers: Bed to chair/wheelchair/BSC            Lateral/Scoot Transfers: Mod assist, From elevated surface General transfer comment: Pt completes bed>recliner on L via lateral scoot with cuing for head/hips relationship & sequencing, multiple scoots & assist with scooting.    Ambulation/Gait                   Stairs             Wheelchair Mobility     Tilt Bed    Modified Rankin (Stroke Patients Only)       Balance Overall balance assessment: Needs assistance Sitting-balance support: Feet supported, Bilateral upper extremity supported Sitting balance-Leahy Scale: Fair Sitting balance - Comments: close supervision<>CGA sitting EOB ~10 minutes with BUE support, pt does put dentures in without LOB though  Cognition Arousal/Alertness: Awake/alert Behavior During Therapy: WFL for tasks assessed/performed Overall Cognitive Status: Within Functional Limits for tasks assessed                                  General Comments: Pt appears to follow simple commands with extra time PRN during session. Pt's wife reports pt's cognition is worse as pt reported not taking pills 2/2 spilling them but wife notes this is not true. MD & Nurse notified.        Exercises      General Comments        Pertinent Vitals/Pain Pain Assessment Pain Assessment: Faces Faces Pain Scale: No hurt    Home Living                          Prior Function            PT Goals (current goals can now be found in the care plan section) Acute Rehab PT Goals Patient Stated Goal: get stronger PT Goal Formulation: With patient/family Time For Goal Achievement: 11/02/22 Potential to Achieve Goals: Fair Progress towards PT goals: Progressing toward goals    Frequency    Min 1X/week      PT Plan Current plan remains appropriate    Co-evaluation              AM-PAC PT "6 Clicks" Mobility   Outcome Measure  Help needed turning from your back to your side while in a flat bed without using bedrails?: A Little Help needed moving from lying on your back to sitting on the side of a flat bed without using bedrails?: A Lot Help needed moving to and from a bed to a chair (including a wheelchair)?: A Lot Help needed standing up from a chair using your arms (e.g., wheelchair or bedside chair)?: Total Help needed to walk in hospital room?: Total Help needed climbing 3-5 steps with a railing? : Total 6 Click Score: 10    End of Session Equipment Utilized During Treatment: Oxygen Activity Tolerance: Patient limited by fatigue Patient left: in chair;with chair alarm set;with restraints reapplied;with family/visitor present Nurse Communication: Mobility status PT Visit Diagnosis: Muscle weakness (generalized) (M62.81);Difficulty in walking, not elsewhere classified (R26.2);Other abnormalities of gait and mobility (R26.89);Unsteadiness on feet (R26.81)     Time:  1610-9604 PT Time Calculation (min) (ACUTE ONLY): 27 min  Charges:    $Therapeutic Activity: 23-37 mins PT General Charges $$ ACUTE PT VISIT: 1 Visit                     Aleda Grana, PT, DPT 10/31/22, 12:26 PM  Sandi Mariscal 10/31/2022, 12:23 PM

## 2022-10-31 NOTE — TOC Transition Note (Signed)
Transition of Care [facility]) - CM/SW Discharge Note   Patient Details  Name: [name] MRN: [id number] Date of Birth: 04/27/1940  [facility] of [facility]) CM/SW Contact:  Eduard Roux, LCSW Phone Number: 10/31/2022, 1:11 PM   Clinical Narrative:     Patient will Discharge to: [facility]  Discharge Date: 10/31/2022 Family Notified: spouse Transport By: [name]  Per MD patient is ready for discharge. RN, patient, and facility notified of discharge. Discharge Summary sent to facility. RN given number for report [phone]. Ambulance transport requested for patient.   Clinical Social Worker signing off.  Antony Blackbird, MSW, LCSW Clinical Social Worker     Final next level of care: Skilled Nursing Facility Barriers to Discharge: Barriers Resolved   Patient Goals and CMS Choice   Choice offered to / list presented to : Patient, Spouse  Discharge Placement                Patient [facility] bed at: [name] Patient to be transferred to facility by: [facility] Name of family member notified: spouse Patient and family notified of of transfer: 10/31/22  Discharge Plan and Services Additional resources added to the After Visit Summary for   In-house Referral: Clinical Social Work Discharge Planning Services: CM Consult Post Acute Care Choice: Home Health                      [facility] Agency: [facility]        Social Determinants of Health (SDOH) Interventions SDOH Screenings   Food Insecurity: No Food Insecurity (10/14/2022)  Housing: Low Risk  (10/14/2022)  Transportation Needs: No Transportation Needs (10/14/2022)  Utilities: Not At Risk (10/14/2022)  Alcohol Screen: Low Risk  (07/13/2022)  Depression (PHQ2-9): Low Risk  (07/13/2022)  Financial Resource Strain: Low Risk  (07/13/2022)  Physical Activity: Insufficiently Active (07/13/2022)  Social Connections: Moderately Integrated (07/13/2022)  Stress: No Stress Concern Present (07/13/2022)   Tobacco Use: Medium Risk (10/26/2022)     Readmission Risk Interventions     No data to display

## 2022-10-31 NOTE — Care Management Important Message (Signed)
Important Message  Patient Details  Name: [name] MRN: [id number] Date of Birth: 12-16-1940   Medicare Important Message Given:  Yes     Renie Ora 10/31/2022, 11:37 AM

## 2022-11-01 ENCOUNTER — Telehealth: Payer: Self-pay

## 2022-11-01 NOTE — Telephone Encounter (Signed)
Clyda Greener, NP with [facility] called requesting a WB status for the pt.  Reviewed pt's chart, spoke with Marisue Humble, [place], returned call, no answer, lf vm informing her of WB as tolerated, use heel, and Darco shoe.

## 2022-11-02 DIAGNOSIS — N1831 Chronic kidney disease, stage 3a: Secondary | ICD-10-CM | POA: Diagnosis not present

## 2022-11-02 DIAGNOSIS — R2689 Other abnormalities of gait and mobility: Secondary | ICD-10-CM | POA: Diagnosis not present

## 2022-11-02 DIAGNOSIS — R6520 Severe sepsis without septic shock: Secondary | ICD-10-CM | POA: Diagnosis not present

## 2022-11-02 DIAGNOSIS — J440 Chronic obstructive pulmonary disease with acute lower respiratory infection: Secondary | ICD-10-CM | POA: Diagnosis not present

## 2022-11-02 DIAGNOSIS — I10 Essential (primary) hypertension: Secondary | ICD-10-CM | POA: Diagnosis not present

## 2022-11-02 DIAGNOSIS — M6281 Muscle weakness (generalized): Secondary | ICD-10-CM | POA: Diagnosis not present

## 2022-11-02 DIAGNOSIS — I739 Peripheral vascular disease, unspecified: Secondary | ICD-10-CM | POA: Diagnosis not present

## 2022-11-02 DIAGNOSIS — I480 Paroxysmal atrial fibrillation: Secondary | ICD-10-CM | POA: Diagnosis not present

## 2022-11-02 DIAGNOSIS — L97501 Non-pressure chronic ulcer of other part of unspecified foot limited to breakdown of skin: Secondary | ICD-10-CM | POA: Diagnosis not present

## 2022-11-02 DIAGNOSIS — I130 Hypertensive heart and chronic kidney disease with heart failure and stage 1 through stage 4 chronic kidney disease, or unspecified chronic kidney disease: Secondary | ICD-10-CM | POA: Diagnosis not present

## 2022-11-02 DIAGNOSIS — R278 Other lack of coordination: Secondary | ICD-10-CM | POA: Diagnosis not present

## 2022-11-02 DIAGNOSIS — J449 Chronic obstructive pulmonary disease, unspecified: Secondary | ICD-10-CM | POA: Diagnosis not present

## 2022-11-02 DIAGNOSIS — I70235 Atherosclerosis of native arteries of right leg with ulceration of other part of foot: Secondary | ICD-10-CM | POA: Diagnosis not present

## 2022-11-02 DIAGNOSIS — D500 Iron deficiency anemia secondary to blood loss (chronic): Secondary | ICD-10-CM | POA: Diagnosis not present

## 2022-11-02 DIAGNOSIS — I714 Abdominal aortic aneurysm, without rupture, unspecified: Secondary | ICD-10-CM | POA: Diagnosis not present

## 2022-11-02 DIAGNOSIS — I5032 Chronic diastolic (congestive) heart failure: Secondary | ICD-10-CM | POA: Diagnosis not present

## 2022-11-03 ENCOUNTER — Ambulatory Visit: Payer: Medicare Other | Admitting: Cardiovascular Disease

## 2022-11-03 DIAGNOSIS — R4182 Altered mental status, unspecified: Secondary | ICD-10-CM | POA: Diagnosis not present

## 2022-11-03 DIAGNOSIS — R051 Acute cough: Secondary | ICD-10-CM | POA: Diagnosis not present

## 2022-11-03 NOTE — Telephone Encounter (Signed)
Contacted pt's wife, [name], she reports pt was transferred to SNF for rehab. Overall he is doing well and eager to start PT/OT. He did have two falls out of bed in the last week but no injuries.  Advised the providers there will manage his warfarin and all of his medication while he is in rehab but to let the PCP and the coumadin clinic know when he is d/c so f/u apts can be made. [name] reports she is also doing ok and was able to get some rest yesterday. She has family support. Advised if anything is needed to not hesitate to contact the coumadin clinic. Advised to let pt know this nurse wishes him well and a speedy return home. [name] verbalized understanding and was appreciative of the call.

## 2022-11-04 ENCOUNTER — Other Ambulatory Visit: Payer: Self-pay | Admitting: *Deleted

## 2022-11-04 DIAGNOSIS — R4182 Altered mental status, unspecified: Secondary | ICD-10-CM | POA: Diagnosis not present

## 2022-11-04 DIAGNOSIS — J189 Pneumonia, unspecified organism: Secondary | ICD-10-CM | POA: Diagnosis not present

## 2022-11-04 DIAGNOSIS — I10 Essential (primary) hypertension: Secondary | ICD-10-CM | POA: Diagnosis not present

## 2022-11-04 DIAGNOSIS — M6281 Muscle weakness (generalized): Secondary | ICD-10-CM | POA: Diagnosis not present

## 2022-11-04 DIAGNOSIS — I130 Hypertensive heart and chronic kidney disease with heart failure and stage 1 through stage 4 chronic kidney disease, or unspecified chronic kidney disease: Secondary | ICD-10-CM | POA: Diagnosis not present

## 2022-11-04 DIAGNOSIS — N1831 Chronic kidney disease, stage 3a: Secondary | ICD-10-CM | POA: Diagnosis not present

## 2022-11-04 DIAGNOSIS — D500 Iron deficiency anemia secondary to blood loss (chronic): Secondary | ICD-10-CM | POA: Diagnosis not present

## 2022-11-04 DIAGNOSIS — J9601 Acute respiratory failure with hypoxia: Secondary | ICD-10-CM | POA: Diagnosis not present

## 2022-11-04 DIAGNOSIS — R6520 Severe sepsis without septic shock: Secondary | ICD-10-CM | POA: Diagnosis not present

## 2022-11-04 DIAGNOSIS — I739 Peripheral vascular disease, unspecified: Secondary | ICD-10-CM

## 2022-11-04 DIAGNOSIS — L97501 Non-pressure chronic ulcer of other part of unspecified foot limited to breakdown of skin: Secondary | ICD-10-CM | POA: Diagnosis not present

## 2022-11-04 DIAGNOSIS — J440 Chronic obstructive pulmonary disease with acute lower respiratory infection: Secondary | ICD-10-CM | POA: Diagnosis not present

## 2022-11-07 ENCOUNTER — Encounter (HOSPITAL_COMMUNITY): Payer: Self-pay

## 2022-11-07 ENCOUNTER — Inpatient Hospital Stay (HOSPITAL_COMMUNITY)
Admission: EM | Admit: 2022-11-07 | Discharge: 2022-11-16 | DRG: 177 | Disposition: A | Discharge status: Other Acute Inpatient Hospital | Payer: Medicare Other | Attending: Internal Medicine | Admitting: Internal Medicine

## 2022-11-07 ENCOUNTER — Other Ambulatory Visit: Payer: Self-pay

## 2022-11-07 ENCOUNTER — Emergency Department (HOSPITAL_COMMUNITY): Payer: Medicare Other

## 2022-11-07 DIAGNOSIS — J440 Chronic obstructive pulmonary disease with acute lower respiratory infection: Secondary | ICD-10-CM | POA: Diagnosis present

## 2022-11-07 DIAGNOSIS — U071 COVID-19: Secondary | ICD-10-CM | POA: Diagnosis not present

## 2022-11-07 DIAGNOSIS — I5030 Unspecified diastolic (congestive) heart failure: Secondary | ICD-10-CM | POA: Diagnosis present

## 2022-11-07 DIAGNOSIS — E870 Hyperosmolality and hypernatremia: Secondary | ICD-10-CM | POA: Diagnosis present

## 2022-11-07 DIAGNOSIS — N1832 Chronic kidney disease, stage 3b: Secondary | ICD-10-CM | POA: Diagnosis present

## 2022-11-07 DIAGNOSIS — I480 Paroxysmal atrial fibrillation: Secondary | ICD-10-CM | POA: Diagnosis not present

## 2022-11-07 DIAGNOSIS — Z79899 Other long term (current) drug therapy: Secondary | ICD-10-CM

## 2022-11-07 DIAGNOSIS — E8729 Other acidosis: Secondary | ICD-10-CM | POA: Diagnosis not present

## 2022-11-07 DIAGNOSIS — I130 Hypertensive heart and chronic kidney disease with heart failure and stage 1 through stage 4 chronic kidney disease, or unspecified chronic kidney disease: Secondary | ICD-10-CM | POA: Diagnosis present

## 2022-11-07 DIAGNOSIS — Y95 Nosocomial condition: Secondary | ICD-10-CM | POA: Diagnosis present

## 2022-11-07 DIAGNOSIS — Z7901 Long term (current) use of anticoagulants: Secondary | ICD-10-CM

## 2022-11-07 DIAGNOSIS — E162 Hypoglycemia, unspecified: Secondary | ICD-10-CM | POA: Diagnosis present

## 2022-11-07 DIAGNOSIS — J159 Unspecified bacterial pneumonia: Secondary | ICD-10-CM | POA: Diagnosis not present

## 2022-11-07 DIAGNOSIS — J9622 Acute and chronic respiratory failure with hypercapnia: Secondary | ICD-10-CM | POA: Diagnosis present

## 2022-11-07 DIAGNOSIS — J9621 Acute and chronic respiratory failure with hypoxia: Secondary | ICD-10-CM

## 2022-11-07 DIAGNOSIS — J1282 Pneumonia due to coronavirus disease 2019: Secondary | ICD-10-CM | POA: Diagnosis present

## 2022-11-07 DIAGNOSIS — N183 Chronic kidney disease, stage 3 unspecified: Secondary | ICD-10-CM | POA: Diagnosis present

## 2022-11-07 DIAGNOSIS — J441 Chronic obstructive pulmonary disease with (acute) exacerbation: Secondary | ICD-10-CM | POA: Diagnosis not present

## 2022-11-07 DIAGNOSIS — L89152 Pressure ulcer of sacral region, stage 2: Secondary | ICD-10-CM | POA: Diagnosis present

## 2022-11-07 DIAGNOSIS — D649 Anemia, unspecified: Secondary | ICD-10-CM | POA: Diagnosis present

## 2022-11-07 DIAGNOSIS — Z7982 Long term (current) use of aspirin: Secondary | ICD-10-CM

## 2022-11-07 DIAGNOSIS — L89516 Pressure-induced deep tissue damage of right ankle: Secondary | ICD-10-CM | POA: Diagnosis present

## 2022-11-07 DIAGNOSIS — J188 Other pneumonia, unspecified organism: Secondary | ICD-10-CM | POA: Diagnosis present

## 2022-11-07 DIAGNOSIS — E039 Hypothyroidism, unspecified: Secondary | ICD-10-CM | POA: Diagnosis present

## 2022-11-07 DIAGNOSIS — I70221 Atherosclerosis of native arteries of extremities with rest pain, right leg: Secondary | ICD-10-CM | POA: Diagnosis present

## 2022-11-07 DIAGNOSIS — N1831 Chronic kidney disease, stage 3a: Secondary | ICD-10-CM | POA: Diagnosis not present

## 2022-11-07 DIAGNOSIS — J189 Pneumonia, unspecified organism: Secondary | ICD-10-CM | POA: Diagnosis present

## 2022-11-07 DIAGNOSIS — J9691 Respiratory failure, unspecified with hypoxia: Secondary | ICD-10-CM | POA: Diagnosis not present

## 2022-11-07 DIAGNOSIS — R0603 Acute respiratory distress: Secondary | ICD-10-CM | POA: Diagnosis not present

## 2022-11-07 DIAGNOSIS — Z9981 Dependence on supplemental oxygen: Secondary | ICD-10-CM

## 2022-11-07 DIAGNOSIS — R0689 Other abnormalities of breathing: Secondary | ICD-10-CM | POA: Diagnosis not present

## 2022-11-07 DIAGNOSIS — Z951 Presence of aortocoronary bypass graft: Secondary | ICD-10-CM | POA: Diagnosis not present

## 2022-11-07 DIAGNOSIS — I5043 Acute on chronic combined systolic (congestive) and diastolic (congestive) heart failure: Secondary | ICD-10-CM | POA: Diagnosis present

## 2022-11-07 DIAGNOSIS — Z7951 Long term (current) use of inhaled steroids: Secondary | ICD-10-CM

## 2022-11-07 DIAGNOSIS — L8990 Pressure ulcer of unspecified site, unspecified stage: Secondary | ICD-10-CM | POA: Insufficient documentation

## 2022-11-07 DIAGNOSIS — I2510 Atherosclerotic heart disease of native coronary artery without angina pectoris: Secondary | ICD-10-CM | POA: Diagnosis present

## 2022-11-07 DIAGNOSIS — I70235 Atherosclerosis of native arteries of right leg with ulceration of other part of foot: Secondary | ICD-10-CM | POA: Diagnosis present

## 2022-11-07 DIAGNOSIS — Z95828 Presence of other vascular implants and grafts: Secondary | ICD-10-CM

## 2022-11-07 DIAGNOSIS — J449 Chronic obstructive pulmonary disease, unspecified: Secondary | ICD-10-CM | POA: Diagnosis present

## 2022-11-07 DIAGNOSIS — I959 Hypotension, unspecified: Secondary | ICD-10-CM | POA: Diagnosis not present

## 2022-11-07 DIAGNOSIS — Z66 Do not resuscitate: Secondary | ICD-10-CM | POA: Diagnosis present

## 2022-11-07 DIAGNOSIS — R54 Age-related physical debility: Secondary | ICD-10-CM | POA: Diagnosis not present

## 2022-11-07 DIAGNOSIS — D696 Thrombocytopenia, unspecified: Secondary | ICD-10-CM | POA: Diagnosis not present

## 2022-11-07 DIAGNOSIS — I714 Abdominal aortic aneurysm, without rupture, unspecified: Secondary | ICD-10-CM | POA: Diagnosis present

## 2022-11-07 DIAGNOSIS — R000 Tachycardia, unspecified: Secondary | ICD-10-CM | POA: Diagnosis not present

## 2022-11-07 DIAGNOSIS — Z86718 Personal history of other venous thrombosis and embolism: Secondary | ICD-10-CM

## 2022-11-07 DIAGNOSIS — N400 Enlarged prostate without lower urinary tract symptoms: Secondary | ICD-10-CM | POA: Diagnosis present

## 2022-11-07 DIAGNOSIS — J90 Pleural effusion, not elsewhere classified: Secondary | ICD-10-CM | POA: Diagnosis not present

## 2022-11-07 DIAGNOSIS — R0602 Shortness of breath: Secondary | ICD-10-CM | POA: Diagnosis not present

## 2022-11-07 DIAGNOSIS — R918 Other nonspecific abnormal finding of lung field: Secondary | ICD-10-CM | POA: Diagnosis not present

## 2022-11-07 DIAGNOSIS — I517 Cardiomegaly: Secondary | ICD-10-CM | POA: Diagnosis not present

## 2022-11-07 DIAGNOSIS — Z79890 Hormone replacement therapy (postmenopausal): Secondary | ICD-10-CM

## 2022-11-07 DIAGNOSIS — I5032 Chronic diastolic (congestive) heart failure: Secondary | ICD-10-CM | POA: Diagnosis not present

## 2022-11-07 DIAGNOSIS — R0902 Hypoxemia: Secondary | ICD-10-CM | POA: Diagnosis not present

## 2022-11-07 DIAGNOSIS — I700 Atherosclerosis of aorta: Secondary | ICD-10-CM | POA: Diagnosis not present

## 2022-11-07 DIAGNOSIS — J80 Acute respiratory distress syndrome: Secondary | ICD-10-CM | POA: Diagnosis present

## 2022-11-07 DIAGNOSIS — I350 Nonrheumatic aortic (valve) stenosis: Secondary | ICD-10-CM

## 2022-11-07 DIAGNOSIS — J811 Chronic pulmonary edema: Secondary | ICD-10-CM | POA: Diagnosis not present

## 2022-11-07 DIAGNOSIS — I10 Essential (primary) hypertension: Secondary | ICD-10-CM | POA: Diagnosis not present

## 2022-11-07 DIAGNOSIS — E785 Hyperlipidemia, unspecified: Secondary | ICD-10-CM | POA: Diagnosis present

## 2022-11-07 LAB — I-STAT VENOUS BLOOD GAS, ED
Acid-base deficit: 4 mmol/L — ABNORMAL HIGH (ref 0.0–2.0)
Acid-base deficit: 5 mmol/L — ABNORMAL HIGH (ref 0.0–2.0)
Bicarbonate: 25.5 mmol/L (ref 20.0–28.0)
Bicarbonate: 23.1 mmol/L (ref 20.0–28.0)
Calcium, Ion: 1.28 mmol/L (ref 1.15–1.40)
Calcium, Ion: 1.24 mmol/L (ref 1.15–1.40)
HCT: 32 % — ABNORMAL LOW (ref 39.0–52.0)
HCT: 27 % — ABNORMAL LOW (ref 39.0–52.0)
Hemoglobin: 10.9 g/dL — ABNORMAL LOW (ref 13.0–17.0)
Hemoglobin: 9.2 g/dL — ABNORMAL LOW (ref 13.0–17.0)
O2 Saturation: 34 %
O2 Saturation: 69 %
Potassium: 4.9 mmol/L (ref 3.5–5.1)
Potassium: 4.5 mmol/L (ref 3.5–5.1)
Sodium: 147 mmol/L — ABNORMAL HIGH (ref 135–145)
Sodium: 147 mmol/L — ABNORMAL HIGH (ref 135–145)
TCO2: 27 mmol/L (ref 22–32)
TCO2: 25 mmol/L (ref 22–32)
pCO2, Ven: 66.7 mmHg — ABNORMAL HIGH (ref 44–60)
pCO2, Ven: 56.7 mmHg (ref 44–60)
pH, Ven: 7.19 — CL (ref 7.25–7.43)
pH, Ven: 7.217 — ABNORMAL LOW (ref 7.25–7.43)
pO2, Ven: 26 mmHg — CL (ref 32–45)
pO2, Ven: 44 mmHg (ref 32–45)

## 2022-11-07 LAB — I-STAT CHEM 8, ED
BUN: 28 mg/dL — ABNORMAL HIGH (ref 8–23)
Calcium, Ion: 1.28 mmol/L (ref 1.15–1.40)
Chloride: 115 mmol/L — ABNORMAL HIGH (ref 98–111)
Creatinine, Ser: 1.3 mg/dL — ABNORMAL HIGH (ref 0.61–1.24)
Glucose, Bld: 114 mg/dL — ABNORMAL HIGH (ref 70–99)
HCT: 32 % — ABNORMAL LOW (ref 39.0–52.0)
Hemoglobin: 10.9 g/dL — ABNORMAL LOW (ref 13.0–17.0)
Potassium: 4.9 mmol/L (ref 3.5–5.1)
Sodium: 147 mmol/L — ABNORMAL HIGH (ref 135–145)
TCO2: 25 mmol/L (ref 22–32)

## 2022-11-07 LAB — RESP PANEL BY RT-PCR (RSV, FLU A&B, COVID)  RVPGX2
Influenza A by PCR: NEGATIVE
Influenza B by PCR: NEGATIVE
Resp Syncytial Virus by PCR: NEGATIVE
SARS Coronavirus 2 by RT PCR: POSITIVE — AB

## 2022-11-07 LAB — COMPREHENSIVE METABOLIC PANEL
ALT: 22 U/L (ref 0–44)
AST: 19 U/L (ref 15–41)
Albumin: 2.6 g/dL — ABNORMAL LOW (ref 3.5–5.0)
Alkaline Phosphatase: 61 U/L (ref 38–126)
Anion gap: 5 (ref 5–15)
BUN: 28 mg/dL — ABNORMAL HIGH (ref 8–23)
CO2: 25 mmol/L (ref 22–32)
Calcium: 8.5 mg/dL — ABNORMAL LOW (ref 8.9–10.3)
Chloride: 115 mmol/L — ABNORMAL HIGH (ref 98–111)
Creatinine, Ser: 1.18 mg/dL (ref 0.61–1.24)
GFR, Estimated: >60 mL/min (ref >60)
Glucose, Bld: 120 mg/dL — ABNORMAL HIGH (ref 70–99)
Potassium: 4.7 mmol/L (ref 3.5–5.1)
Sodium: 145 mmol/L (ref 135–145)
Total Bilirubin: 0.6 mg/dL (ref 0.3–1.2)
Total Protein: 5.9 g/dL — ABNORMAL LOW (ref 6.5–8.1)

## 2022-11-07 LAB — CBC WITH DIFFERENTIAL/PLATELET
Abs Immature Granulocytes: 0.32 10*3/uL — ABNORMAL HIGH (ref 0.00–0.07)
Basophils Absolute: 0 10*3/uL (ref 0.0–0.1)
Basophils Relative: 0 %
Eosinophils Absolute: 0 10*3/uL (ref 0.0–0.5)
Eosinophils Relative: 0 %
HCT: 35.9 % — ABNORMAL LOW (ref 39.0–52.0)
Hemoglobin: 9.7 g/dL — ABNORMAL LOW (ref 13.0–17.0)
Immature Granulocytes: 3 %
Lymphocytes Relative: 2 %
Lymphs Abs: 0.2 10*3/uL — ABNORMAL LOW (ref 0.7–4.0)
MCH: 27.4 pg (ref 26.0–34.0)
MCHC: 27 g/dL — ABNORMAL LOW (ref 30.0–36.0)
MCV: 101.4 fL — ABNORMAL HIGH (ref 80.0–100.0)
Monocytes Absolute: 0.6 10*3/uL (ref 0.1–1.0)
Monocytes Relative: 5 %
Neutro Abs: 10.8 10*3/uL — ABNORMAL HIGH (ref 1.7–7.7)
Neutrophils Relative %: 90 %
Platelets: 305 10*3/uL (ref 150–400)
RBC: 3.54 MIL/uL — ABNORMAL LOW (ref 4.22–5.81)
RDW: 22.1 % — ABNORMAL HIGH (ref 11.5–15.5)
WBC: 11.9 10*3/uL — ABNORMAL HIGH (ref 4.0–10.5)
nRBC: 0.2 % (ref 0.0–0.2)

## 2022-11-07 LAB — MRSA NEXT GEN BY PCR, NASAL: MRSA by PCR Next Gen: NOT DETECTED

## 2022-11-07 LAB — LACTIC ACID, PLASMA
Lactic Acid, Venous: 1.7 mmol/L (ref 0.5–1.9)
Lactic Acid, Venous: 1.6 mmol/L (ref 0.5–1.9)

## 2022-11-07 LAB — GLUCOSE, CAPILLARY: Glucose-Capillary: 123 mg/dL — ABNORMAL HIGH (ref 70–99)

## 2022-11-07 LAB — TROPONIN I (HIGH SENSITIVITY)
Troponin I (High Sensitivity): 76 ng/L — ABNORMAL HIGH (ref <18)
Troponin I (High Sensitivity): 89 ng/L — ABNORMAL HIGH (ref <18)

## 2022-11-07 LAB — PROCALCITONIN: Procalcitonin: 0.14 ng/mL

## 2022-11-07 LAB — PROTIME-INR
INR: 3.6 — ABNORMAL HIGH (ref 0.8–1.2)
Prothrombin Time: 36.2 seconds — ABNORMAL HIGH (ref 11.4–15.2)

## 2022-11-07 LAB — CBG MONITORING, ED: Glucose-Capillary: 153 mg/dL — ABNORMAL HIGH (ref 70–99)

## 2022-11-07 LAB — BRAIN NATRIURETIC PEPTIDE: B Natriuretic Peptide: 997.2 pg/mL — ABNORMAL HIGH (ref 0.0–100.0)

## 2022-11-07 LAB — MAGNESIUM: Magnesium: 2.3 mg/dL (ref 1.7–2.4)

## 2022-11-07 MED ORDER — IPRATROPIUM-ALBUTEROL 0.5-2.5 (3) MG/3ML IN SOLN
3.0000 mL | Freq: Four times a day (QID) | RESPIRATORY_TRACT | Status: DC
  Administered 2022-11-08 – 2022-11-15 (×32): 3 mL via RESPIRATORY_TRACT
  Filled 2022-11-07 (×33): qty 3

## 2022-11-07 MED ORDER — ALBUTEROL SULFATE (2.5 MG/3ML) 0.083% IN NEBU
INHALATION_SOLUTION | RESPIRATORY_TRACT | Status: AC
  Administered 2022-11-07: 10 mg
  Filled 2022-11-07: qty 12

## 2022-11-07 MED ORDER — NOREPINEPHRINE 4 MG/250ML-% IV SOLN: 0.0000 ug/min | INTRAVENOUS | Status: DC

## 2022-11-07 MED ORDER — ORAL CARE MOUTH RINSE
15.0000 mL | OROMUCOSAL | Status: DC
  Administered 2022-11-08 – 2022-11-16 (×25): 15 mL via OROMUCOSAL

## 2022-11-07 MED ORDER — ALBUTEROL SULFATE (2.5 MG/3ML) 0.083% IN NEBU
5.0000 mg | INHALATION_SOLUTION | Freq: Once | RESPIRATORY_TRACT | Status: AC
  Administered 2022-11-07: 5 mg via RESPIRATORY_TRACT
  Filled 2022-11-07: qty 6

## 2022-11-07 MED ORDER — VANCOMYCIN HCL 1500 MG/300ML IV SOLN
1500.0000 mg | Freq: Once | INTRAVENOUS | Status: AC
  Administered 2022-11-07: 1500 mg via INTRAVENOUS
  Filled 2022-11-07: qty 300

## 2022-11-07 MED ORDER — ALBUTEROL SULFATE (2.5 MG/3ML) 0.083% IN NEBU: 10.0000 mg | INHALATION_SOLUTION | Freq: Once | RESPIRATORY_TRACT | Status: DC

## 2022-11-07 MED ORDER — METRONIDAZOLE 500 MG/100ML IV SOLN
500.0000 mg | Freq: Once | INTRAVENOUS | Status: AC
  Administered 2022-11-07: 500 mg via INTRAVENOUS
  Filled 2022-11-07: qty 100

## 2022-11-07 MED ORDER — LACTATED RINGERS IV SOLN: INTRAVENOUS | Status: AC

## 2022-11-07 MED ORDER — INSULIN ASPART 100 UNIT/ML IJ SOLN
0.0000 [IU] | INTRAMUSCULAR | Status: DC
  Administered 2022-11-07: 1 [IU] via SUBCUTANEOUS
  Administered 2022-11-07: 2 [IU] via SUBCUTANEOUS
  Administered 2022-11-09 – 2022-11-10 (×2): 1 [IU] via SUBCUTANEOUS
  Administered 2022-11-10: 3 [IU] via SUBCUTANEOUS
  Administered 2022-11-10 – 2022-11-13 (×10): 1 [IU] via SUBCUTANEOUS

## 2022-11-07 MED ORDER — IPRATROPIUM-ALBUTEROL 0.5-2.5 (3) MG/3ML IN SOLN
3.0000 mL | Freq: Once | RESPIRATORY_TRACT | Status: AC
  Filled 2022-11-07: qty 3

## 2022-11-07 MED ORDER — ORAL CARE MOUTH RINSE: 15.0000 mL | OROMUCOSAL | Status: DC | PRN

## 2022-11-07 MED ORDER — DOCUSATE SODIUM 100 MG PO CAPS: 100.0000 mg | ORAL_CAPSULE | Freq: Two times a day (BID) | ORAL | Status: DC | PRN

## 2022-11-07 MED ORDER — METHYLPREDNISOLONE SODIUM SUCC 125 MG IJ SOLR
60.0000 mg | Freq: Once | INTRAMUSCULAR | Status: AC
  Administered 2022-11-07: 60 mg via INTRAVENOUS
  Filled 2022-11-07: qty 2

## 2022-11-07 MED ORDER — SODIUM CHLORIDE 0.9 % IV BOLUS (SEPSIS)
500.0000 mL | Freq: Once | INTRAVENOUS | Status: AC
  Administered 2022-11-07: 500 mL via INTRAVENOUS

## 2022-11-07 MED ORDER — FUROSEMIDE 10 MG/ML IJ SOLN
40.0000 mg | Freq: Once | INTRAMUSCULAR | Status: AC
  Administered 2022-11-07: 40 mg via INTRAVENOUS
  Filled 2022-11-07: qty 4

## 2022-11-07 MED ORDER — VANCOMYCIN HCL IN DEXTROSE 1-5 GM/200ML-% IV SOLN: 1000.0000 mg | Freq: Once | INTRAVENOUS | Status: DC

## 2022-11-07 MED ORDER — POLYETHYLENE GLYCOL 3350 17 G PO PACK: 17.0000 g | PACK | Freq: Every day | ORAL | Status: DC | PRN

## 2022-11-07 MED ORDER — SODIUM CHLORIDE 0.9 % IV SOLN
2.0000 g | Freq: Once | INTRAVENOUS | Status: AC
  Administered 2022-11-07: 2 g via INTRAVENOUS
  Filled 2022-11-07: qty 12.5

## 2022-11-07 MED ORDER — IPRATROPIUM-ALBUTEROL 0.5-2.5 (3) MG/3ML IN SOLN
RESPIRATORY_TRACT | Status: AC
  Administered 2022-11-07: 3 mL via RESPIRATORY_TRACT
  Filled 2022-11-07: qty 3

## 2022-11-07 NOTE — Progress Notes (Signed)
   11/07/22 1435  BiPAP/CPAP/SIPAP  $ Non-Invasive Ventilator  Non-Invasive Vent Set Up;Non-Invasive Vent Initial  $ Face Mask Medium Yes  BiPAP/CPAP/SIPAP Pt Type Adult  BiPAP/CPAP/SIPAP V60  Mask Type Full face mask  Mask Size Medium  Set Rate 10 breaths/min  Respiratory Rate 26 breaths/min  IPAP 14 cmH20  EPAP 6 cmH2O  FiO2 (%) 40 %  Minute Ventilation 15.8  Leak 14  Peak Inspiratory Pressure (PIP) 14  Tidal Volume (Vt) 598  Patient Home Equipment No  Auto Titrate No  Press High Alarm 30 cmH2O  Press Low Alarm 5 cmH2O  Nasal massage performed No (comment)  BiPAP/CPAP /SiPAP Vitals  Pulse Rate 72  Resp (!) 26  SpO2 99 %  Bilateral Breath Sounds Coarse crackles;Expiratory wheezes   RT placed Pt on bipap at this time. Pt is tolerating settings well. RT will monitor as needed.

## 2022-11-07 NOTE — Progress Notes (Signed)
Pt transported to 2H03 on BIPAP from ED18 with no complications.

## 2022-11-07 NOTE — Progress Notes (Signed)
ED Pharmacy Antibiotic Sign Off An antibiotic consult was received from an ED provider for Cefepime and Vancomcyin per pharmacy dosing for sepsis. A chart review was completed to assess appropriateness.   The following one time order(s) were placed:  Cefepime 2g IV x1 Vancomycin 1500mg  IV x1  Further antibiotic and/or antibiotic pharmacy consults should be ordered by the admitting provider if indicated.   Thank you for allowing pharmacy to be a part of this patient's care.   Wilburn Cornelia, PharmD, BCPS Clinical Pharmacist 11/07/2022 2:28 PM   Please refer to AMION for pharmacy phone number

## 2022-11-07 NOTE — Progress Notes (Signed)
The patient is a 82 year old male with history of severe aortic stenosis, chronic HFpEF, AAA, peripheral vascular disease, paroxysmal A-fib, hypothyroidism, recent admission (10/11/2022 through 10/31/2022 )for septic shock secondary to severe PAD with critical limb ischemia and gangrenous ulcers of the right foot status post right femoral to below the knee popliteal artery bypass and right transmetatarsal amputation.  The patient presented from SNF due to altered mental status and hypoxia.  Per EMS, O2 saturation 73% on room air, improved on nonrebreather to 100%.  In the ED, workup revealed severe hypoxic and hypercarbic respiratory failure with pH 7.190 and pCO2 of 66.7 on venous blood gas.  The patient was promptly placed on BiPAP on arrival to the ED.  COVID-19 screening test returned positive.  Chest x-ray revealing worsening infiltrates in the right mid and lower lung with a right pleural effusion.  Lab studies notable for elevated BNP greater than 900, hypernatremia with serum sodium of 147, uptrending troponin 89 from 76.    The patient received IV Solu-Medrol, IV vancomycin, cefepime, and IV Flagyl.  TRH, hospitalist service, was asked to admit.  Present at bedside.  The patient is on BiPAP with NIV settings of 14/5 with FiO2 of 40%, O2 saturation of 96%.  BPs are soft 124/48 on the monitor, despite IV fluid hydration NS at 150/h.  On exam, the patient is frail-appearing, somnolent but arousable to voices.  Currently on BiPAP. Diffuse rales noted bilaterally Regular rate and rhythm with no rubs or gallops noted. Right lower extremity with 2+ pitting edema and status post right transmetatarsal amputation, with staples still present.  Right lower extremity scars noted from previous vascular surgery. The patient follows commands and is able to wiggle his left toes on command. Abdomen is soft and nontender with bowel sounds present  Discussed the case with EDP, Dr. Jarold Motto.  PCCM will admit the  patient to their service in the intensive care unit.  TRH, hospitalist service, will be available once the patient's critical condition has stabilized.  Time: 35 minutes.

## 2022-11-07 NOTE — H&P (Signed)
NAME:  [name], MRN:  [id number], DOB:  [birth year], LOS: 0 ADMISSION DATE:  11/07/2022, CONSULTATION DATE:  11/07/2022 REFERRING MD:  Eloise Harman - EDP, CHIEF COMPLAINT: PNA, COVID positive  History of Present Illness:  82 year old man who presented to [facility] ED 7/15 for SOB. PMHx significant for HTN, HLD, CAD (s/p CABG), PAF with history of DVT (s/p IVC filter, on Coumadin), HFrEF (Echo 6/28 with EF 55-60%), COPD, AAA, bilateral RAS, CKD stage IIIb, subclavian artery stenosis and severe PAD (s/p R iliofemoral endarterectomy, popliteal endarterectomy and R TMA with VVS 6/25, 7/2). Recently admitted to [facility] 6/18 - 7/8 for sepsis due to RLE infection (s/p surgical intervention), discharged to rehab [facility]).  History is obtained primarily from patient's wife/from chart review. Per patient's wife, discharged from [facility] 7/8 to [facility] for rehab postoperatively. EMS called for SOB/respiratory distress; per report SpO2 73% on RA, improved to 100% with NRB mask. Per facility records, patient had been on ceftriaxone/clindamycin x 5 days for PNA treatment.  On ED arrival, patient temp was 99.80F with HR 80, RR 23, BP 117/93, SpO2 100% on NRB, transitioned to BiPAP. Labs were notable for WBC 11.9, Hgb 9.7, Plt 305. INR 3.6 (on Coumadin). Na 145, K 4.7, CO2 25, Cr 1.18 (baseline); LFTs WNL. BNP 997 (585), trop 76 > 89, suspect demand-related. COVID positive. CXR demonstrated worsening RML/RLL PNA with R pleural effusion. Broad-spectrum antibiotic coverage initiated.  PCCM consulted for ICU admission.  Pertinent Medical History:  HFrEF, AAA, bilateral renal artery stenosis, CKD stage IIIb, COPD, CAD, HTN, HLD, PAF with history of DVT anticoagulated with Coumadin and IVC filter in place, PAD, and, subclavian artery stenosis   Significant Hospital Events: Including procedures, antibiotic start and stop dates in addition to other pertinent events   7/15 - Presented to ED with SOB/resp distress. Placed on  BiPAP. CXR with multifocal PNA, primarily RML/RLL, R pleural effusion. Broad-spectrum antibiotics started. PCCM consulted for admission.  Interim History / Subjective:  PCCM consulted for ICU admission.  Objective:  Blood pressure 114/76, pulse 74, temperature 98.5 F (36.9 C), temperature source Axillary, resp. rate 20, height 5\' 9"  (1.753 m), weight 68.2 kg, SpO2 98%.    FiO2 (%):  [40 %] 40 %   Intake/Output Summary (Last 24 hours) at 11/07/2022 2023 Last data filed at 11/07/2022 1724 Gross per 24 hour  Intake 971.92 ml  Output --  Net 971.92 ml   Filed Weights   11/07/22 1401  Weight: 68.2 kg   Physical Examination: General: Acutely ill-appearing elderly man in NAD. Resting on BiPAP. HEENT: [place]/AT, anicteric sclera, PERRL, dry mucous membranes. Edentulous. Neuro: Lethargic. Will answer questions appropriately when awoken. Responds to verbal stimuli. Following commands consistently. Moves all 4 extremities spontaneously. Generalized weakness.  CV: RRR, no m/g/r. PULM: Breathing even and unlabored on BiPAP. Lung fields with scattered rhonchi, R > L, diminished at R base. GI: Soft, nontender, nondistended. Normoactive bowel sounds. Extremities: Trace R LE edema noted, well-healing incisions of R medial thigh, R medial knee and R foot (s/p TMA) with staple closure, no erythema/drainage. R medial thigh incision with firm, mobile fullness c/w ?postop seroma. Skin: Warm/dry, no rashes.  Resolved Hospital Problem List:    Assessment & Plan:  Multifocal PNA, HCAP vs. COVID-associated COVID-19 infection COPD with acute exacerbation in the setting of PNA - Admit to ICU for close monitoring - Continue BiPAP - Supplemental O2 support for O2 sat 88-92% - Bronchodilators as ordered - Steroids daily - Likely no  indication at this time for remdesivir, given 5 days into illness/symptoms - Pulmonary hygiene - Broad-spectrum antibiotic coverage (vanc/cefepime) - Trend WBC, fever curve -  F/u Cx data  Chronic HFrEF; has had recovery of ejection fraction to 55 to 60% on most recent Echo Severe aortic valve stenosis - Trend troponin - Cardiac monitoring - Optimize volume status as able - Outpatient Cardiology f/u for AS, needs to recover from current hospitalization prior to consideration of intervention  Paroxysmal atrial fibrillation anticoagulated with Coumadin PTA. Failed DCCV January 2024. History of DVT Paroxysmal Afib - Continue amiodarone PO - Cardiac monitoring - Optimize electrolytes for K > 4, Mg > 2  Recent critical limb ischemia of right lower extremity with gangrene Extensive peripheral arterial disease - S/p R iliofem EA/popliteal EA 6/25, R TMA 7/2 - Management per VVS - ASA/statin - Continue Coumadin, low threshold to transition to heparin gtt temporarily if requiring procedures - PT/OT  Abdominal aortic aneurysm- 5.5 cm on recent imaging, enlarged per prior image - Tight BP control - Continue ASA/statin - Outpatient monitoring/mgmt per VVS  CKD stage IIIa, stable - Trend BMP - Replete electrolytes as indicated - Monitor I&Os - Avoid nephrotoxic agents as able - Ensure adequate renal perfusion  Hypothyroidism - Continue levothyroxine  Anemia - Trend H&H - Monitor for signs of active bleeding - Transfuse for Hgb < 7.0 or hemodynamically significant bleeding   Critical care time:   The patient is critically ill with multiple organ system failure and requires high complexity decision making for assessment and support, frequent evaluation and titration of therapies, advanced monitoring, review of radiographic studies and interpretation of complex data.   Critical Care Time devoted to patient care services, exclusive of separately billable procedures, described in this note is 39 minutes.  Tim Lair, PA-C [facility] 11/07/22 8:54 PM  Please see [email or web address] for pager details.  From 7A-7P if no response,  please call [phone] After hours, please call ELink [phone]

## 2022-11-07 NOTE — ED Provider Notes (Signed)
[place] EMERGENCY DEPARTMENT AT [facility] Provider Note   CSN: [id number] Arrival date & time: 11/07/22  1346     History  Chief Complaint  Patient presents with   Shortness of Breath    [name] is a 82 y.o. male.  HPI Patient presents for shortness of breath.  Medical history includes COPD, CHF, AAA, DVT, HLD, HTN, CAD, BPH.  He was admitted last month for septic shock from right lower extremity infection.  He arrives today from a nursing facility for shortness of breath.  Per EMS, he does wear nasal cannula at baseline.  Baseline dose of O2 was unknown.  He was hypoxic on scene in the 70s.  He was placed on a nonrebreather.  Per review of nursing facility paperwork, he has been on ceftriaxone and clindamycin for the past 5 days for treatment of pneumonia.  Patient currently denies any areas of pain.  Despite his tachypnea and increased work of breathing, he states that his breathing feels "comfortable".    Home Medications Prior to Admission medications   Medication Sig Start Date End Date Taking? Authorizing Provider  acetaminophen (TYLENOL) 325 MG tablet Take 2 tablets (650 mg total) by mouth every 6 (six) hours as needed for mild pain, fever or headache. 10/31/22   [name], MD  albuterol (VENTOLIN HFA) 108 (90 Base) MCG/ACT inhaler Inhale 2 puffs into the lungs every 6 (six) hours as needed for wheezing or shortness of breath. 03/25/22   Sunnie Nielsen, DO  alum & mag hydroxide-simeth (MAALOX/MYLANTA) 200-200-20 MG/5ML suspension Take 15-30 mLs by mouth every 2 (two) hours as needed for indigestion. 10/31/22   [name], MD  amiodarone (PACERONE) 200 MG tablet TAKE 1 TABLET BY MOUTH TWICE A DAY 08/29/22   Bensimhon, Bevelyn Buckles, MD  aspirin EC 81 MG tablet Take 1 tablet (81 mg total) by mouth daily at 6 (six) AM. Swallow whole. 11/01/22   [name], MD  atorvastatin (LIPITOR) 20 MG tablet TAKE 1 TABLET BY MOUTH EVERY DAY 10/21/22   [name], MD  enoxaparin (LOVENOX) 80 MG/0.8ML injection Inject 0.675 mLs (67.5 mg total) into the skin every 12 (twelve) hours. 10/31/22   [name], MD  levothyroxine (SYNTHROID) 150 MCG tablet TAKE ONE TABLET BY MOUTH EVERY DAY BEFORE BREAKFAST Patient taking differently: Take 150 mcg by mouth daily before breakfast. TAKE ONE TABLET BY MOUTH EVERY DAY BEFORE BREAKFAST 07/08/22   Copland, Karleen Hampshire, MD  mometasone-formoterol (DULERA) 200-5 MCG/ACT AERO Inhale 2 puffs into the lungs 2 (two) times daily. 10/31/22   [name], MD  saccharomyces boulardii (FLORASTOR) 250 MG capsule Take 1 capsule (250 mg total) by mouth 2 (two) times daily for 7 days. 10/31/22 11/07/22  [name], MD  tamsulosin (FLOMAX) 0.4 MG CAPS capsule Take 1 capsule (0.4 mg total) by mouth daily. 10/31/22   [name], MD  warfarin (COUMADIN) 5 MG tablet Take 2.5 mg by mouth See admin instructions. Take 2.5 mg by mouth once daily except Saturday.    [provider]      Allergies    Plavix [clopidogrel bisulfate]    Review of Systems   Review of Systems  Respiratory:  Positive for cough and shortness of breath.   Neurological:  Positive for weakness.  All other systems reviewed and are negative.   Physical Exam Updated Vital Signs BP (!) 117/93   Pulse 80   Temp 99.2 F (37.3 C)  Resp (!) 23   Ht 5\' 9"  (1.753 m)   Wt 68.2 kg   SpO2 100%   BMI 22.20 kg/m  Physical Exam Vitals and nursing note reviewed.  Constitutional:      General: He is not in acute distress.    Appearance: Normal appearance. He is well-developed. He is ill-appearing. He is not toxic-appearing or diaphoretic.  HENT:     Head: Normocephalic and atraumatic.     Right Ear: External ear normal.     Left Ear: External ear normal.     Nose: Nose normal.     Mouth/Throat:     Mouth: Mucous membranes are moist.  Eyes:     Extraocular Movements: Extraocular movements intact.     Conjunctiva/sclera:  Conjunctivae normal.  Cardiovascular:     Rate and Rhythm: Normal rate and regular rhythm.     Heart sounds: No murmur heard. Pulmonary:     Effort: Tachypnea present.     Breath sounds: Wheezing, rhonchi and rales present.  Abdominal:     General: There is no distension.     Palpations: Abdomen is soft.     Tenderness: There is no abdominal tenderness.  Musculoskeletal:        General: No swelling.     Cervical back: Normal range of motion and neck supple.     Right lower leg: No edema.     Left lower leg: No edema.  Skin:    General: Skin is warm and dry.     Coloration: Skin is not jaundiced or pale.  Neurological:     General: No focal deficit present.     Mental Status: He is alert and oriented to person, place, and time.  Psychiatric:        Mood and Affect: Mood normal.        Behavior: Behavior normal.     ED Results / Procedures / Treatments   Labs (all labs ordered are listed, but only abnormal results are displayed) Labs Reviewed  RESP PANEL BY RT-PCR (RSV, FLU A&B, COVID)  RVPGX2 - Abnormal; Notable for the following components:      Result Value   SARS Coronavirus 2 by RT PCR POSITIVE (*)    All other components within normal limits  COMPREHENSIVE METABOLIC PANEL - Abnormal; Notable for the following components:   Chloride 115 (*)    Glucose, Bld 120 (*)    BUN 28 (*)    Calcium 8.5 (*)    Total Protein 5.9 (*)    Albumin 2.6 (*)    All other components within normal limits  CBC WITH DIFFERENTIAL/PLATELET - Abnormal; Notable for the following components:   WBC 11.9 (*)    RBC 3.54 (*)    Hemoglobin 9.7 (*)    HCT 35.9 (*)    MCV 101.4 (*)    MCHC 27.0 (*)    RDW 22.1 (*)    Neutro Abs 10.8 (*)    Lymphs Abs 0.2 (*)    Abs Immature Granulocytes 0.32 (*)    All other components within normal limits  BRAIN NATRIURETIC PEPTIDE - Abnormal; Notable for the following components:   B Natriuretic Peptide 997.2 (*)    All other components within normal  limits  PROTIME-INR - Abnormal; Notable for the following components:   Prothrombin Time 36.2 (*)    INR 3.6 (*)    All other components within normal limits  I-STAT CHEM 8, ED - Abnormal; Notable for the following components:  Sodium 147 (*)    Chloride 115 (*)    BUN 28 (*)    Creatinine, Ser 1.30 (*)    Glucose, Bld 114 (*)    Hemoglobin 10.9 (*)    HCT 32.0 (*)    All other components within normal limits  I-STAT VENOUS BLOOD GAS, ED - Abnormal; Notable for the following components:   pH, Ven 7.190 (*)    pCO2, Ven 66.7 (*)    pO2, Ven 26 (*)    Acid-base deficit 4.0 (*)    Sodium 147 (*)    HCT 32.0 (*)    Hemoglobin 10.9 (*)    All other components within normal limits  CULTURE, BLOOD (ROUTINE X 2)  CULTURE, BLOOD (ROUTINE X 2)  MAGNESIUM  LACTIC ACID, PLASMA  URINALYSIS, ROUTINE W REFLEX MICROSCOPIC  LACTIC ACID, PLASMA  I-STAT VENOUS BLOOD GAS, ED  TROPONIN I (HIGH SENSITIVITY)  TROPONIN I (HIGH SENSITIVITY)    EKG EKG Interpretation Date/Time:  Monday November 07 2022 13:54:22 EDT Ventricular Rate:  69 PR Interval:  29 QRS Duration:  163 QT Interval:  475 QTC Calculation: 509 R Axis:   -62  Text Interpretation: Sinus rhythm Short PR interval RBBB and LAFB Artifact in lead(s) I II III aVR aVL aVF V2 Confirmed by Gloris Manchester [id number] on 11/07/2022 2:16:15 PM  Radiology DG Chest Port 1 View  Result Date: 11/07/2022 CLINICAL DATA:  Shortness of breath.  Pneumonia. EXAM: PORTABLE CHEST 1 VIEW COMPARISON:  10/29/2022 FINDINGS: Previous median sternotomy and CABG. Cardiomegaly and aortic atherosclerosis as seen previously. Previous brachiocephalic vessel stent. Worsening of infiltrate in the mid and lower lung on the right, with a right effusion. No focal infiltrate seen on the left. Question pulmonary venous hypertension/fluid overload in general. IMPRESSION: 1. Worsening of infiltrate in the mid and lower lung on the right, with a right effusion. 2. Question pulmonary  venous hypertension/fluid overload in general. 3. Previous CABG. Cardiomegaly. Aortic atherosclerosis. Electronically Signed   By: Paulina Fusi M.D.   On: 11/07/2022 14:42    Procedures Procedures    Medications Ordered in ED Medications  lactated ringers infusion ( Intravenous New Bag/Given 11/07/22 1529)  vancomycin (VANCOREADY) IVPB 1500 mg/300 mL (1,500 mg Intravenous New Bag/Given 11/07/22 1524)  ipratropium-albuterol (DUONEB) 0.5-2.5 (3) MG/3ML nebulizer solution 3 mL (3 mLs Nebulization Given 11/07/22 1430)  methylPREDNISolone sodium succinate (SOLU-MEDROL) 125 mg/2 mL injection 60 mg (60 mg Intravenous Given 11/07/22 1421)  sodium chloride 0.9 % bolus 500 mL (0 mLs Intravenous Stopped 11/07/22 1531)  ceFEPIme (MAXIPIME) 2 g in sodium chloride 0.9 % 100 mL IVPB (0 g Intravenous Stopped 11/07/22 1500)  metroNIDAZOLE (FLAGYL) IVPB 500 mg (0 mg Intravenous Stopped 11/07/22 1535)    ED Course/ Medical Decision Making/ A&P Clinical Course as of 11/07/22 1623  Mon Nov 07, 2022  1537 Assumed care from Dr. Durwin Nora.  82 year old male with a history of COPD on home oxygen (unknown amount) CHF, DVT, hypertension, hyperlipidemia, and CAD who presents emergency department with shortness of breath.  Chest x-ray does show a right-sided infiltrate that is concerning for pneumonia that was refractory to outpatient antibiotics.  Was placed on vancomycin and cefepime.  Also was in respiratory distress and having a COPD exacerbation so was placed on BiPAP and was given steroids and nebulizers.  Was given a small bolus of fluids due to his heart failure.  VBG showed respiratory acidosis.  He is pending a repeat blood gas at this time and will require admission  to stepdown versus ICU. [RP]    Clinical Course User Index [RP] Rondel Baton, MD                             Medical Decision Making Amount and/or Complexity of Data Reviewed Labs: ordered. Radiology: ordered.  Risk Prescription drug  management.   This patient presents to the ED for concern of shortness of breath, this involves an extensive number of treatment options, and is a complaint that carries with it a high risk of complications and morbidity.  The differential diagnosis includes pneumonia, COPD exacerbation, CHF exacerbation, pleural effusion, anemia, acidosis, other metabolic derangements   Co morbidities that complicate the patient evaluation  COPD, CHF, AAA, DVT, HLD, HTN, CAD, BPH   Additional history obtained:  Additional history obtained from patient's daughter External records from outside source obtained and reviewed including EMR   Lab Tests:  I Ordered, and personally interpreted labs.  The pertinent results include: Respiratory acidosis on blood gas, mild leukocytosis, baseline anemia, elevated BNP   Imaging Studies ordered:  I ordered imaging studies including chest x-ray I independently visualized and interpreted imaging which showed worsening right-sided pneumonia I agree with the radiologist interpretation   Cardiac Monitoring: / EKG:  The patient was maintained on a cardiac monitor.  I personally viewed and interpreted the cardiac monitored which showed an underlying rhythm of: Sinus rhythm  Problem List / ED Course / Critical interventions / Medication management  Patient arrives for shortness of breath and acute on chronic hypoxia.  This is despite treatment for pneumonia for the past 5 days with ceftriaxone and clindamycin.  On arrival, patient is ill-appearing, tachypneic, and hypotensive.  IV fluids and broad-spectrum antibiotics were ordered.  On auscultation, he does have wheezing and rhonchi present.  Treatment for COPD was initiated with DuoNeb and Solu-Medrol.  Gentle IV fluids were ordered pending lab work.  Broad-spectrum antibiotics were ordered.  Patient currently denies any areas of pain.  Septic workup was initiated.  Chest x-ray shows worsening right-sided pneumonia.   On blood gas, he does have a hypercarbic respiratory failure.  BiPAP was initiated.  Patient was able to tolerate this well and had improved mentation on reassessment.  Blood pressures improved without pressors.  He confirmed that he is full code.  At time of signout, repeat blood gas was pending.  Care of patient was signed out to oncoming ED provider. I ordered medication including IV fluids for hydration, broad-spectrum antibiotics for concern of sepsis, DuoNeb and Solu-Medrol for empiric treatment of COPD exacerbation Reevaluation of the patient after these medicines showed that the patient improved I have reviewed the patients home medicines and have made adjustments as needed   Social Determinants of Health:  Resides in nursing facility  CRITICAL CARE Performed by: Gloris Manchester   Total critical care time: 35 minutes  Critical care time was exclusive of separately billable procedures and treating other patients.  Critical care was necessary to treat or prevent imminent or life-threatening deterioration.  Critical care was time spent personally by me on the following activities: development of treatment plan with patient and/or surrogate as well as nursing, discussions with consultants, evaluation of patient's response to treatment, examination of patient, obtaining history from patient or surrogate, ordering and performing treatments and interventions, ordering and review of laboratory studies, ordering and review of radiographic studies, pulse oximetry and re-evaluation of patient's condition.  Final Clinical Impression(s) / ED Diagnoses Final diagnoses:  Pneumonia of right lung due to infectious organism, unspecified part of lung  Acute on chronic respiratory failure with hypoxia and hypercapnia (HCC)  COPD exacerbation (HCC)    Rx / DC Orders ED Discharge Orders     None         Gloris Manchester, MD 11/07/22 1623

## 2022-11-07 NOTE — ED Triage Notes (Signed)
Pt bib ems from [facility] facility, got out the hospital last week with pneumonia for tx but worsening symptoms. Baseline 98% on [place]. EMS reports 100% on NRB, but RA 73%. Pt alert but confusion at baseline. Diminished lung sounds on right side. Rhonchi on all fields. IV on right hand from facility.   EMS vital signs: 97.3 oral  124/90 76 23-30* 132 cbg

## 2022-11-07 NOTE — ED Provider Notes (Signed)
  Physical Exam  BP (!) 113/52   Pulse (!) 56   Temp 98.5 F (36.9 C) (Axillary)   Resp 20   Ht 5\' 9"  (1.753 m)   Wt 68.2 kg   SpO2 99%   BMI 22.20 kg/m   Physical Exam  Procedures  Procedures  ED Course / MDM   Clinical Course as of 11/08/22 0001  Mon Nov 07, 2022  1537 Assumed care from Dr. Durwin Nora.  82 year old male with a history of COPD on home oxygen (unknown amount) CHF, DVT, hypertension, hyperlipidemia, and CAD who presents emergency department with shortness of breath.  Chest x-ray does show a right-sided infiltrate that is concerning for pneumonia that was refractory to outpatient antibiotics.  Was placed on vancomycin and cefepime.  Also was in respiratory distress and having a COPD exacerbation so was placed on BiPAP and was given steroids and nebulizers.  Was given a small bolus of fluids due to his heart failure.  VBG showed respiratory acidosis.  He is pending a repeat blood gas at this time and will require admission to stepdown versus ICU. [RP]  1934 Dr Margo Aye consulted for admission.  Requesting ICU be engaged as well. [RP]  1942 Dr. Pamala Hurry from ICU has evaluated the patient and will admit for further management.  Patient remained stable on BiPAP.  Given several additional nebulizer treatments [RP]    Clinical Course User Index [RP] Rondel Baton, MD   Medical Decision Making Amount and/or Complexity of Data Reviewed Labs: ordered. Radiology: ordered.  Risk Prescription drug management. Decision regarding hospitalization.   CRITICAL CARE Performed by: Rondel Baton   Total critical care time: 30 minutes  Critical care time was exclusive of separately billable procedures and treating other patients.  Critical care was necessary to treat or prevent imminent or life-threatening deterioration.  Critical care was time spent personally by me on the following activities: development of treatment plan with patient and/or surrogate as well as  nursing, discussions with consultants, evaluation of patient's response to treatment, examination of patient, obtaining history from patient or surrogate, ordering and performing treatments and interventions, ordering and review of laboratory studies, ordering and review of radiographic studies, pulse oximetry and re-evaluation of patient's condition.      Rondel Baton, MD 11/08/22 0001

## 2022-11-08 ENCOUNTER — Inpatient Hospital Stay (HOSPITAL_COMMUNITY): Payer: Medicare Other

## 2022-11-08 DIAGNOSIS — J189 Pneumonia, unspecified organism: Secondary | ICD-10-CM | POA: Diagnosis not present

## 2022-11-08 DIAGNOSIS — I700 Atherosclerosis of aorta: Secondary | ICD-10-CM | POA: Diagnosis not present

## 2022-11-08 DIAGNOSIS — R918 Other nonspecific abnormal finding of lung field: Secondary | ICD-10-CM | POA: Diagnosis not present

## 2022-11-08 DIAGNOSIS — J811 Chronic pulmonary edema: Secondary | ICD-10-CM | POA: Diagnosis not present

## 2022-11-08 LAB — GLUCOSE, CAPILLARY
Glucose-Capillary: 85 mg/dL (ref 70–99)
Glucose-Capillary: 95 mg/dL (ref 70–99)
Glucose-Capillary: 84 mg/dL (ref 70–99)

## 2022-11-08 LAB — BASIC METABOLIC PANEL
Anion gap: 7 (ref 5–15)
BUN: 25 mg/dL — ABNORMAL HIGH (ref 8–23)
CO2: 22 mmol/L (ref 22–32)
Calcium: 7.8 mg/dL — ABNORMAL LOW (ref 8.9–10.3)
Chloride: 115 mmol/L — ABNORMAL HIGH (ref 98–111)
Creatinine, Ser: 1.13 mg/dL (ref 0.61–1.24)
GFR, Estimated: >60 mL/min (ref >60)
Glucose, Bld: 96 mg/dL (ref 70–99)
Potassium: 4 mmol/L (ref 3.5–5.1)
Sodium: 144 mmol/L (ref 135–145)

## 2022-11-08 LAB — CBC
HCT: 29.7 % — ABNORMAL LOW (ref 39.0–52.0)
Hemoglobin: 8.3 g/dL — ABNORMAL LOW (ref 13.0–17.0)
MCH: 27.5 pg (ref 26.0–34.0)
MCHC: 27.9 g/dL — ABNORMAL LOW (ref 30.0–36.0)
MCV: 98.3 fL (ref 80.0–100.0)
Platelets: 207 10*3/uL (ref 150–400)
RBC: 3.02 MIL/uL — ABNORMAL LOW (ref 4.22–5.81)
RDW: 21.8 % — ABNORMAL HIGH (ref 11.5–15.5)
WBC: 6.6 10*3/uL (ref 4.0–10.5)
nRBC: 0 % (ref 0.0–0.2)

## 2022-11-08 LAB — PROTIME-INR
INR: 3.9 — ABNORMAL HIGH (ref 0.8–1.2)
Prothrombin Time: 38.8 seconds — ABNORMAL HIGH (ref 11.4–15.2)

## 2022-11-08 LAB — MAGNESIUM: Magnesium: 2.1 mg/dL (ref 1.7–2.4)

## 2022-11-08 LAB — PHOSPHORUS: Phosphorus: 3.4 mg/dL (ref 2.5–4.6)

## 2022-11-08 MED ORDER — AMIODARONE HCL 200 MG PO TABS
200.0000 mg | ORAL_TABLET | Freq: Two times a day (BID) | ORAL | Status: DC
  Administered 2022-11-08 – 2022-11-16 (×16): 200 mg via ORAL
  Filled 2022-11-08 (×17): qty 1

## 2022-11-08 MED ORDER — FUROSEMIDE 10 MG/ML IJ SOLN
40.0000 mg | Freq: Once | INTRAMUSCULAR | Status: AC
  Administered 2022-11-08: 40 mg via INTRAVENOUS
  Filled 2022-11-08: qty 4

## 2022-11-08 MED ORDER — POTASSIUM CHLORIDE CRYS ER 20 MEQ PO TBCR
40.0000 meq | EXTENDED_RELEASE_TABLET | Freq: Once | ORAL | Status: AC
  Administered 2022-11-08: 40 meq via ORAL
  Filled 2022-11-08: qty 2

## 2022-11-08 MED ORDER — LEVOTHYROXINE SODIUM 75 MCG PO TABS
150.0000 ug | ORAL_TABLET | Freq: Every day | ORAL | Status: DC
  Administered 2022-11-09 – 2022-11-15 (×6): 150 ug via ORAL
  Filled 2022-11-08 (×8): qty 2

## 2022-11-08 MED ORDER — ASPIRIN 81 MG PO TBEC
81.0000 mg | DELAYED_RELEASE_TABLET | Freq: Every day | ORAL | Status: DC
  Administered 2022-11-08 – 2022-11-16 (×8): 81 mg via ORAL
  Filled 2022-11-08 (×8): qty 1

## 2022-11-08 MED ORDER — HYDROCORTISONE SOD SUC (PF) 100 MG IJ SOLR
100.0000 mg | Freq: Two times a day (BID) | INTRAMUSCULAR | Status: DC
  Administered 2022-11-08 – 2022-11-09 (×3): 100 mg via INTRAVENOUS
  Filled 2022-11-08 (×3): qty 2

## 2022-11-08 MED ORDER — SODIUM CHLORIDE 0.9 % IV SOLN
2.0000 g | Freq: Two times a day (BID) | INTRAVENOUS | Status: AC
  Administered 2022-11-08 – 2022-11-13 (×13): 2 g via INTRAVENOUS
  Filled 2022-11-08 (×13): qty 12.5

## 2022-11-08 MED ORDER — CHLORHEXIDINE GLUCONATE CLOTH 2 % EX PADS
6.0000 | MEDICATED_PAD | Freq: Every day | CUTANEOUS | Status: DC
  Administered 2022-11-08 – 2022-11-10 (×3): 6 via TOPICAL

## 2022-11-08 MED ORDER — VANCOMYCIN HCL IN DEXTROSE 1-5 GM/200ML-% IV SOLN: 1000.0000 mg | INTRAVENOUS | Status: DC

## 2022-11-08 NOTE — Progress Notes (Addendum)
Pt taken off BiPAP and placed on 4L HFNC (slater). Pt tolerating well at this time, Vitals stable, No increased WOB noted, SPO2 96%, RN aware, CCM aware, BiPAP in standby at bedside, RT will monitor as needed.   11/08/22 0817  Therapy Vitals  Pulse Rate 66  Resp 19  BP (!) 120/52  MEWS Score/Color  MEWS Score 0  MEWS Score Color Green  Respiratory Assessment  Assessment Type Assess only  Respiratory Pattern Regular;Unlabored  Chest Assessment Chest expansion symmetrical  Cough None  Sputum Amount None  Bilateral Breath Sounds Diminished  Oxygen Therapy/Pulse Ox  O2 Device (S)  HFNC  O2 Therapy (S)  Oxygen humidified  O2 Flow Rate (L/min) 4 L/min  SpO2 96 %

## 2022-11-08 NOTE — Progress Notes (Signed)
Pt placed on Heated HFNC by RT due to increased WOB per CCM. Pt tolerating well,vitals stable,SPO2 95%, RN aware, CCM notified, RT will monitor as needed.       11/08/22 1045  Therapy Vitals  Pulse Rate 62  Resp 20  BP (!) 126/49  MEWS Score/Color  MEWS Score 0  MEWS Score Color Green  Respiratory Assessment  Assessment Type Assess only  Respiratory Pattern Regular;Unlabored  Chest Assessment Chest expansion symmetrical  Cough None  Sputum Amount None  Bilateral Breath Sounds Diminished  Oxygen Therapy/Pulse Ox  O2 Device (S)  HFNC  $ High Flow Nasal Cannula  Yes  O2 Therapy Oxygen humidified  Heater temperature 87.8 F (31 C)  O2 Flow Rate (L/min) 30 L/min  FiO2 (%) 30 %  SpO2 95 %

## 2022-11-08 NOTE — Progress Notes (Signed)
ANTICOAGULATION CONSULT NOTE - Initial Consult  Pharmacy Consult for warfarin Indication: history DVT  Allergies  Allergen Reactions   Plavix [Clopidogrel Bisulfate]     Brain hemorrhage prev while on plavix and aspirin    Patient Measurements: Height: 5\' 9"  (175.3 cm) Weight: 68.2 kg (150 lb 5.7 oz) IBW/kg (Calculated) : 70.7   Vital Signs: Temp: 97.9 F (36.6 C) (07/16 1100) Temp Source: Oral (07/16 1100) BP: 127/54 (07/16 1100) Pulse Rate: 64 (07/16 1100)  Labs: Recent Labs    11/07/22 1407 11/07/22 1414 11/07/22 1415 11/07/22 1430 11/07/22 1617 11/07/22 1730 11/08/22 0629 11/08/22 1008  HGB 9.7*   < > 10.9*  --   --  9.2* 8.3*  --   HCT 35.9*   < > 32.0*  --   --  27.0* 29.7*  --   PLT 305  --   --   --   --   --  207  --   LABPROT 36.2*  --   --   --   --   --   --  38.8*  INR 3.6*  --   --   --   --   --   --  3.9*  CREATININE 1.18  --  1.30*  --   --   --  1.13  --   TROPONINIHS  --   --   --  76* 89*  --   --   --    < > = values in this interval not displayed.    Estimated Creatinine Clearance: 49.5 mL/min (by C-G formula based on SCr of 1.13 mg/dL).   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 06/2011 Echo: EF 35-40%; b. 06/2012 Echo: EF 50%; c. 12/2016 Echo: EF 55-60%, no rwma, GRI DD, mild AS/MR, nl RV fxn, nl RVSP; d. 02/2021 Echo: EF 55%, GrII DD. Mild MR. Mild to mod TR. Sev AS by VTI.   AAA (abdominal aortic aneurysm) (HCC)    a. 05/2020 Abd u/s: 3.6cm.   Aortic calcification (HCC) 05/01/2013   Bilateral renal artery stenosis (HCC)    a. 06/2013 Angio/PTA: LRA: 95 (5x18 Herculink stent), RRA 60ost; b. 04/2021 Angio: mod RRA stenosis w/ patent LRA stent.   Carotid arterial disease (HCC)    a. 05/2020 Carotid U/S: RICA 1-39%, LICA 40-59%   CHF (congestive heart failure) (HCC)    Chronic kidney disease    COPD (chronic obstructive pulmonary disease) (HCC)    Coronary  artery disease    a. 2013 s/p CABG x 3 (LIMA->LAD, VG->OM, VG->RPDA; b. 06/2012 MV: no ischemia; c. 02/2021 Cath: LM 100, RCA 90ost, 131m, free LIMA->LAD nl, VG->OM2 nl, VG->RPDA nl. Mod AS (mean grad , AVA 1.1cm^2). RHC w/ elev filling pressures.   History of prior cigarette smoking 04/11/2008   Qualifier: Diagnosis of  By: Ermalene Searing MD, Amy     Hyperlipidemia    Hypertension    Hypothyroidism    Intraventricular hemorrhage (HCC) 07/12/2012   Ischemic cardiomyopathy    a. 06/2011 Echo: EF 35-40%; b. 06/2012 Echo: EF 50%; c. 12/2016 Echo: EF 55-60%, no rwma, GRI DD; d. 02/2021 Echo: Ef 55%, GrII DD.   Moderate aortic stenosis    a. 02/2021 Echo: EF 55%, GrII DD. Sev Ca2+ of AoV. Sev AS w/ AVA by VTI 0.79cm^2; b. 02/2021 Cath: Mod AS w/ mean grad . AVA 1.1cm^2.   PAF (paroxysmal atrial fibrillation) (  HCC)    Peripheral arterial disease (HCC)    a. Previous left lower extremity stenting by Dr. Evette Cristal;  b. 12/2012 s/p bilat ostial common iliac stenting; c. 04/2021 Angio: Patent LRA stent, mod RRA stenosis. Small AAA. Sev plaque RSFA 2/ subtl occl of inf branch of profunda. Diff Ca2+ SFA dzs w/ collats from profunda and 3V runoff. Diffuse LSFA dzs w/ collats from profunda. Subtl PT dzs. 3V runoff-->Med Rx.   Right leg DVT (HCC) 08/13/2012   Subclavian artery stenosis, left (HCC) 05/2012   Status post stenting of the ostium and self-expanding stent placement to the left axillary artery   Venous insufficiency     Medications:  Medications Prior to Admission  Medication Sig Dispense Refill Last Dose   acetaminophen (TYLENOL) 325 MG tablet Take 2 tablets (650 mg total) by mouth every 6 (six) hours as needed for mild pain, fever or headache.   Past Week   albuterol (VENTOLIN HFA) 108 (90 Base) MCG/ACT inhaler Inhale 2 puffs into the lungs every 6 (six) hours as needed for wheezing or shortness of breath. 18 g 0 unk   alum & mag hydroxide-simeth (MAALOX/MYLANTA) 200-200-20 MG/5ML suspension Take  15-30 mLs by mouth every 2 (two) hours as needed for indigestion. 355 mL 0 unk   amiodarone (PACERONE) 200 MG tablet TAKE 1 TABLET BY MOUTH TWICE A DAY 180 tablet 1 11/07/2022   aspirin EC 81 MG tablet Take 1 tablet (81 mg total) by mouth daily at 6 (six) AM. Swallow whole. 30 tablet 12 11/07/2022   atorvastatin (LIPITOR) 20 MG tablet TAKE 1 TABLET BY MOUTH EVERY DAY (Patient taking differently: Take 20 mg by mouth daily.) 90 tablet 1 11/07/2022   cefTRIAXone (ROCEPHIN) 2-2.22 GM-%(50ML) IVPB Inject 2 g into the vein daily.   11/07/2022   clindamycin (CLEOCIN) 300 MG capsule Take 300 mg by mouth 4 (four) times daily.   11/07/2022   enoxaparin (LOVENOX) 80 MG/0.8ML injection Inject 0.675 mLs (67.5 mg total) into the skin every 12 (twelve) hours.   11/07/2022   ipratropium-albuterol (DUONEB) 0.5-2.5 (3) MG/3ML SOLN Take 3 mLs by nebulization in the morning and at bedtime.   11/07/2022   levothyroxine (SYNTHROID) 150 MCG tablet TAKE ONE TABLET BY MOUTH EVERY DAY BEFORE BREAKFAST (Patient taking differently: Take 150 mcg by mouth daily before breakfast. TAKE ONE TABLET BY MOUTH EVERY DAY BEFORE BREAKFAST) 90 tablet 3 11/07/2022   mometasone-formoterol (DULERA) 200-5 MCG/ACT AERO Inhale 2 puffs into the lungs 2 (two) times daily. 1 each  11/07/2022   predniSONE (DELTASONE) 20 MG tablet Take 20 mg by mouth daily with breakfast.   11/07/2022   pregabalin (LYRICA) 50 MG capsule Take 50 mg by mouth daily.   11/06/2022   [EXPIRED] saccharomyces boulardii (FLORASTOR) 250 MG capsule Take 1 capsule (250 mg total) by mouth 2 (two) times daily for 7 days.   11/07/2022   tamsulosin (FLOMAX) 0.4 MG CAPS capsule Take 1 capsule (0.4 mg total) by mouth daily. 30 capsule  11/07/2022   warfarin (COUMADIN) 5 MG tablet Take 2.5 mg by mouth See admin instructions. Take 2.5 mg by mouth once daily except Saturday.   11/06/2022   Scheduled:   albuterol  10 mg Nebulization Once   amiodarone  200 mg Oral BID   aspirin EC  81 mg Oral Q0600    Chlorhexidine Gluconate Cloth  6 each Topical Daily   hydrocortisone sod succinate (SOLU-CORTEF) inj  100 mg Intravenous BID   insulin aspart  0-9 Units  Subcutaneous Q4H   ipratropium-albuterol  3 mL Nebulization Q6H   mouth rinse  15 mL Mouth Rinse 4 times per day    Assessment: 82 yo male with COPD exacerbation, PNA and COVID positive. He is on warfarin PTA for history of DVT (IVC in place). Pharmacy consulted to dose warfarin. He was also on full dose enoxaparin PTA (last dose given 11/07/22) -INR=3.9, Hg= 8.3  Home warfarin dose: 2.5mg /day except take none on Saturday  Goal of Therapy:  INR 2-3 Monitor platelets by anticoagulation protocol: Yes   Plan:  -Hold warfarin today -Daily PT/INR  Harland German, PharmD Clinical Pharmacist **Pharmacist phone directory can now be found on [email or web address] ([email or web address]).  Listed under [facility].

## 2022-11-08 NOTE — Plan of Care (Signed)
  Problem: Education: Goal: Understanding of CV disease, CV risk reduction, and recovery process will improve Outcome: Not Progressing Goal: Individualized Educational Video(s) Outcome: Not Progressing   Problem: Activity: Goal: Ability to return to baseline activity level will improve Outcome: Not Progressing   Problem: Cardiovascular: Goal: Ability to achieve and maintain adequate cardiovascular perfusion will improve Outcome: Not Progressing Goal: Vascular access site(s) Level 0-1 will be maintained Outcome: Not Progressing   Problem: Health Behavior/Discharge Planning: Goal: Ability to safely manage health-related needs after discharge will improve Outcome: Not Progressing   Problem: Education: Goal: Knowledge of prescribed regimen will improve Outcome: Not Progressing   Problem: Activity: Goal: Ability to tolerate increased activity will improve Outcome: Not Progressing   Problem: Bowel/Gastric: Goal: Gastrointestinal status for postoperative course will improve Outcome: Not Progressing   Problem: Clinical Measurements: Goal: Postoperative complications will be avoided or minimized Outcome: Not Progressing Goal: Signs and symptoms of graft occlusion will improve Outcome: Not Progressing   Problem: Skin Integrity: Goal: Demonstration of wound healing without infection will improve Outcome: Not Progressing   Problem: Education: Goal: Knowledge of General Education information will improve Description: Including pain rating scale, medication(s)/side effects and non-pharmacologic comfort measures Outcome: Not Progressing   Problem: Health Behavior/Discharge Planning: Goal: Ability to manage health-related needs will improve Outcome: Not Progressing   Problem: Clinical Measurements: Goal: Ability to maintain clinical measurements within normal limits will improve Outcome: Not Progressing Goal: Will remain free from infection Outcome: Not Progressing Goal:  Diagnostic test results will improve Outcome: Not Progressing Goal: Respiratory complications will improve Outcome: Not Progressing Goal: Cardiovascular complication will be avoided Outcome: Not Progressing   Problem: Activity: Goal: Risk for activity intolerance will decrease Outcome: Not Progressing   Problem: Nutrition: Goal: Adequate nutrition will be maintained Outcome: Not Progressing   Problem: Coping: Goal: Level of anxiety will decrease Outcome: Not Progressing   Problem: Elimination: Goal: Will not experience complications related to bowel motility Outcome: Not Progressing Goal: Will not experience complications related to urinary retention Outcome: Not Progressing   Problem: Pain Managment: Goal: General experience of comfort will improve Outcome: Not Progressing   Problem: Safety: Goal: Ability to remain free from injury will improve Outcome: Not Progressing   Problem: Skin Integrity: Goal: Risk for impaired skin integrity will decrease Outcome: Not Progressing

## 2022-11-08 NOTE — TOC Initial Note (Signed)
Transition of Care [facility]) - Initial/Assessment Note    Patient Details  Name: [name] MRN: [id number] Date of Birth: 1940-10-13  Transition of Care [facility]) CM/SW Contact:    Delilah Shan, LCSWA Phone Number: 11/08/2022, 1:52 PM  Clinical Narrative:                  Patient is from [facility] short term. Patient TOC will continue to follow and assist with patients dc planning needs.       Patient Goals and CMS Choice            Expected Discharge Plan and Services                                              Prior Living Arrangements/Services                       Activities of Daily Living      Permission Sought/Granted                  Emotional Assessment              Admission diagnosis:  COPD exacerbation (HCC) [J44.1] Acute on chronic respiratory failure with hypoxia and hypercapnia (HCC) [J96.21, J96.22] Multifocal pneumonia [J18.9] Pneumonia of right lung due to infectious organism, unspecified part of lung [J18.9] Patient Active Problem List   Diagnosis Date Noted   Multifocal pneumonia 11/07/2022   Critical limb ischemia of right lower extremity with ulceration of foot (HCC) 10/11/2022   Limb ischemia 10/11/2022   Heart failure with preserved ejection fraction (HCC) 03/23/2022   History of deep vein thrombosis (DVT) of lower extremity 03/23/2022   Pneumothorax 02/06/2022   Venous stasis ulcer of left ankle limited to breakdown of skin with varicose veins (HCC) 09/22/2021   Aortic valve stenosis    Long term (current) use of anticoagulants 01/27/2017   DVT (deep venous thrombosis) (HCC) 12/29/2016   PAF (paroxysmal atrial fibrillation) (HCC) 05/24/2013   Aortic calcification (HCC) 05/01/2013   CKD (chronic kidney disease) stage 3, GFR 30-59 ml/min (HCC) 05/01/2013   Bilateral renal artery stenosis (HCC)    HTN (hypertension) 07/15/2012   Intraventricular hemorrhage (HCC) 07/12/2012   Peripheral arterial  disease (HCC)    Ischemic cardiomyopathy 07/08/2011   CAD (coronary artery disease), native coronary artery 07/07/2011   Subclavian arterial stenosis (HCC) 04/25/2011   BPH (benign prostatic hyperplasia) 12/29/2010   COPD (chronic obstructive pulmonary disease) (HCC) 01/08/2010   Abdominal aortic aneurysm (HCC) 11/06/2009   HYPERCHOLESTEROLEMIA 10/09/2009   History of prior cigarette smoking 04/11/2008   Hypothyroidism 09/07/2006   PCP:  [name], MD Pharmacy:   [facility] - [place], [place] - [address] [place] [address] Phone: [phone] Fax: [phone]  [facility] [address], [place] - [address] [place] [address] Phone: [phone] Fax: [phone]     Social Determinants of Health (SDOH) Social History: SDOH Screenings   Food Insecurity: No Food Insecurity (10/14/2022)  Housing: Low Risk  (10/14/2022)  Transportation Needs: No Transportation Needs (10/14/2022)  Utilities: Not At Risk (10/14/2022)  Alcohol Screen: Low Risk  (07/13/2022)  Depression (PHQ2-9): Low Risk  (07/13/2022)  Financial Resource Strain: Low Risk  (07/13/2022)  Physical Activity: Insufficiently Active (07/13/2022)  Social Connections: Moderately Integrated (07/13/2022)  Stress: No Stress Concern Present (07/13/2022)  Tobacco Use: Medium Risk (11/07/2022)   SDOH Interventions:     Readmission Risk Interventions     No data to display

## 2022-11-08 NOTE — Progress Notes (Signed)
eLink Physician-Brief Progress Note Patient Name: BOLUWATIFE FLIGHT DOB: Mar 10, 1941 MRN: [id number]   Date of Service  11/08/2022  HPI/Events of Note  82 yr old male with multiple medical problems including CAD, CHF, PVD, aortic stenosis and recent hospitalization admitted with SOB and hypoxia and pulm infiltrates.  COVID positive.   Edema and pna felt to be present.  Needing Bipap  eICU Interventions  Chart reviewed     Intervention Category Evaluation Type: New Patient Evaluation  [name] Russel, P 11/08/2022, 12:27 AM

## 2022-11-08 NOTE — Progress Notes (Addendum)
NAME:  [name], MRN:  [id number], DOB:  1940-05-06, LOS: 1 ADMISSION DATE:  11/07/2022, CONSULTATION DATE:  11/07/2022 REFERRING MD:  Eloise Harman - EDP, CHIEF COMPLAINT: PNA, COVID positive  History of Present Illness:  82 year old man who presented to [facility] ED 7/15 for SOB. PMHx significant for HTN, HLD, CAD (s/p CABG), PAF with history of DVT (s/p IVC filter, on Coumadin), HFrEF (Echo 6/28 with EF 55-60%), COPD, AAA, bilateral RAS, CKD stage IIIb, subclavian artery stenosis and severe PAD (s/p R iliofemoral endarterectomy, popliteal endarterectomy and R TMA with [facility] 6/25, 7/2). Recently admitted to [facility] 6/18 - 7/8 for sepsis due to RLE infection (s/p surgical intervention), discharged to rehab [facility]).  History is obtained primarily from patient's wife/from chart review. Per patient's wife, discharged from [facility] 7/8 to [facility] for rehab postoperatively. EMS called for SOB/respiratory distress; per report SpO2 73% on RA, improved to 100% with NRB mask. Per facility records, patient had been on ceftriaxone/clindamycin x 5 days for PNA treatment.  On ED arrival, patient temp was 99.59F with HR 80, RR 23, BP 117/93, SpO2 100% on NRB, transitioned to BiPAP. Labs were notable for WBC 11.9, Hgb 9.7, Plt 305. INR 3.6 (on Coumadin). Na 145, K 4.7, CO2 25, Cr 1.18 (baseline); LFTs WNL. BNP 997 (585), trop 76 > 89, suspect demand-related. COVID positive. CXR demonstrated worsening RML/RLL PNA with R pleural effusion. Broad-spectrum antibiotic coverage initiated.  PCCM consulted for ICU admission.  Pertinent Medical History:  HFrEF, AAA, bilateral renal artery stenosis, CKD stage IIIb, COPD, CAD, HTN, HLD, PAF with history of DVT anticoagulated with Coumadin and IVC filter in place, PAD, and, subclavian artery stenosis   Significant Hospital Events: Including procedures, antibiotic start and stop dates in addition to other pertinent events   7/15 - Presented to ED with SOB/resp distress. Placed on  BiPAP. CXR with multifocal PNA, primarily RML/RLL, R pleural effusion. Broad-spectrum antibiotics started. PCCM consulted for admission. 7/16 of BIPAP to [place]  Interim History / Subjective:  Doing well on BiPAP Transitioned to 4L [place] and tolerating well Hypoglycemic borderline this morning Diuresed well with lasix, remains positive for the admission.    Objective:  Blood pressure (!) 130/52, pulse 61, temperature 99.1 F (37.3 C), temperature source Axillary, resp. rate 17, height 5\' 9"  (1.753 m), weight 68.2 kg, SpO2 100%.    FiO2 (%):  [40 %] 40 %   Intake/Output Summary (Last 24 hours) at 11/08/2022 0954 Last data filed at 11/08/2022 0800 Gross per 24 hour  Intake 3382.76 ml  Output 1375 ml  Net 2007.76 ml   Filed Weights   11/07/22 1401  Weight: 68.2 kg   Physical Examination: - Defer to attending.  Significant labs: trop (HS) 89, Procal 0.14  Resolved Hospital Problem List:    Assessment & Plan:  Multifocal PNA, HCAP vs. COVID-associated COVID-19 infection COPD with acute exacerbation in the setting of PNA - Off continuous BiPAP > can keep PRN and at bedtime for now - Supplemental O2 support for O2 sat 88-92% - Scheduled nebs - IV steroids - Pulmonary hygiene > IS/FV - ABX cefepime continue for HAP coverage. DC vanco with negative MRSA PCR. Low threshold to to deescalate/DC with low PCT.  - F/u Cx data  Chronic HFrEF; has had recovery of ejection fraction to 55 to 60% on most recent Echo Severe aortic valve stenosis - Cardiac monitoring - Diuresis as tolerated - lasix 40mg  today + K supp.  - Outpatient Cardiology f/u for AS,  needs to recover from current hospitalization prior to consideration of intervention  Paroxysmal atrial fibrillation anticoagulated with Coumadin PTA. Failed DCCV January 2024. History of DVT Paroxysmal Afib - Continue amiodarone PO - Warfarin per pharmacy - Cardiac monitoring - Optimize electrolytes for K > 4, Mg > 2  Recent critical  limb ischemia of right lower extremity with gangrene Extensive peripheral arterial disease - S/p R iliofem EA/popliteal EA 6/25, R TMA 7/2 - Outpatient management per VVS - ASA/statin - Continue Coumadin - PT/OT  Abdominal aortic aneurysm- 5.5 cm on recent imaging, enlarged per prior image - Continue ASA/statin - Outpatient monitoring/mgmt per [facility]  CKD stage IIIa, stable - Trend BMP - Replete electrolytes as indicated - Monitor I&Os  Hypothyroidism - Continue levothyroxine  Anemia - Trend H&H - Monitor for signs of active bleeding - Transfuse for Hgb < 7.0 or hemodynamically significant bleeding   Critical care time:     Joneen Roach, AGACNP-BC [facility]  See Amion for personal pager PCCM on call pager [phone] until 7pm. Please call Elink 7p-7a. [phone]  11/08/2022 10:07 AM  Attending addendum: Agree with above Exam with reduced breath sounds on R Overall feeling better MM dry Globally weak Recent R TMA noted Poor peripheral pulses Think we can avoid intubation, would encourage IS and use BIPAP at bedtime and PRN  Will take a look at R pleural space with [place]  Myrla Halsted MD PCCM

## 2022-11-08 NOTE — Progress Notes (Addendum)
Pharmacy Antibiotic Note  [name] is a 82 y.o. male admitted on 11/07/2022 with VDRF/pneumonia.  Pharmacy has been consulted for Vancomycin and Cefepime dosing. Plan: Vancomycin 1000 mg IV q24h Cefepime 2 g IV q12h  Height: 5\' 9"  (175.3 cm) Weight: 68.2 kg (150 lb 5.7 oz) IBW/kg (Calculated) : 70.7  Temp (24hrs), Avg:98.2 F (36.8 C), Min:96.8 F (36 C), Max:99.2 F (37.3 C)  Recent Labs  Lab 11/07/22 1407 11/07/22 1411 11/07/22 1415 11/07/22 1715  WBC 11.9*  --   --   --   CREATININE 1.18  --  1.30*  --   LATICACIDVEN  --  1.7  --  1.6    Estimated Creatinine Clearance: 43 mL/min (A) (by C-G formula based on SCr of 1.3 mg/dL (H)).    Allergies  Allergen Reactions   Plavix [Clopidogrel Bisulfate]     Brain hemorrhage prev while on plavix and aspirin     Eddie Candle 11/08/2022 12:25 AM

## 2022-11-09 DIAGNOSIS — J189 Pneumonia, unspecified organism: Secondary | ICD-10-CM | POA: Diagnosis not present

## 2022-11-09 LAB — CBC
HCT: 30.6 % — ABNORMAL LOW (ref 39.0–52.0)
Hemoglobin: 8.7 g/dL — ABNORMAL LOW (ref 13.0–17.0)
MCH: 28.1 pg (ref 26.0–34.0)
MCHC: 28.4 g/dL — ABNORMAL LOW (ref 30.0–36.0)
MCV: 98.7 fL (ref 80.0–100.0)
Platelets: 200 10*3/uL (ref 150–400)
RBC: 3.1 MIL/uL — ABNORMAL LOW (ref 4.22–5.81)
RDW: 21.8 % — ABNORMAL HIGH (ref 11.5–15.5)
WBC: 7.9 10*3/uL (ref 4.0–10.5)
nRBC: 0.3 % — ABNORMAL HIGH (ref 0.0–0.2)

## 2022-11-09 LAB — BASIC METABOLIC PANEL
Anion gap: 9 (ref 5–15)
BUN: 28 mg/dL — ABNORMAL HIGH (ref 8–23)
CO2: 27 mmol/L (ref 22–32)
Calcium: 8.4 mg/dL — ABNORMAL LOW (ref 8.9–10.3)
Chloride: 109 mmol/L (ref 98–111)
Creatinine, Ser: 1.14 mg/dL (ref 0.61–1.24)
GFR, Estimated: >60 mL/min (ref >60)
Glucose, Bld: 91 mg/dL (ref 70–99)
Potassium: 4.4 mmol/L (ref 3.5–5.1)
Sodium: 145 mmol/L (ref 135–145)

## 2022-11-09 LAB — GLUCOSE, CAPILLARY
Glucose-Capillary: 107 mg/dL — ABNORMAL HIGH (ref 70–99)
Glucose-Capillary: 113 mg/dL — ABNORMAL HIGH (ref 70–99)
Glucose-Capillary: 131 mg/dL — ABNORMAL HIGH (ref 70–99)
Glucose-Capillary: 150 mg/dL — ABNORMAL HIGH (ref 70–99)
Glucose-Capillary: 73 mg/dL (ref 70–99)
Glucose-Capillary: 76 mg/dL (ref 70–99)
Glucose-Capillary: 76 mg/dL (ref 70–99)
Glucose-Capillary: 95 mg/dL (ref 70–99)

## 2022-11-09 LAB — MAGNESIUM: Magnesium: 2.2 mg/dL (ref 1.7–2.4)

## 2022-11-09 LAB — PHOSPHORUS: Phosphorus: 2.6 mg/dL (ref 2.5–4.6)

## 2022-11-09 LAB — PROTIME-INR
INR: 2.8 — ABNORMAL HIGH (ref 0.8–1.2)
Prothrombin Time: 29.7 seconds — ABNORMAL HIGH (ref 11.4–15.2)

## 2022-11-09 MED ORDER — METHYLPREDNISOLONE SODIUM SUCC 125 MG IJ SOLR
80.0000 mg | Freq: Three times a day (TID) | INTRAMUSCULAR | Status: DC
  Administered 2022-11-09 – 2022-11-14 (×15): 80 mg via INTRAVENOUS
  Filled 2022-11-09 (×15): qty 2

## 2022-11-09 MED ORDER — WARFARIN - PHARMACIST DOSING INPATIENT: Freq: Every day | Status: DC

## 2022-11-09 MED ORDER — FUROSEMIDE 10 MG/ML IJ SOLN
20.0000 mg | Freq: Once | INTRAMUSCULAR | Status: AC
  Administered 2022-11-09: 20 mg via INTRAVENOUS
  Filled 2022-11-09: qty 2

## 2022-11-09 MED ORDER — WARFARIN SODIUM 2 MG PO TABS
2.0000 mg | ORAL_TABLET | Freq: Once | ORAL | Status: AC
  Administered 2022-11-09: 2 mg via ORAL
  Filled 2022-11-09: qty 1

## 2022-11-09 NOTE — Progress Notes (Signed)
Came to room for breathing treatment.  Pt requesting break from bipap, no distress noted.  Pt placed on HHFNC, appears to be tol well currently, no distress currently noted.

## 2022-11-09 NOTE — Progress Notes (Signed)
RN called d/t pt was having SOB and RN placed pt on bipap.  Currently pt is on bipap, no distress noted on bipap and pt appears comfortable.  VSS currently.

## 2022-11-09 NOTE — Progress Notes (Signed)
ANTICOAGULATION CONSULT NOTE - follow up  Pharmacy Consult for warfarin Indication: history DVT  Allergies  Allergen Reactions   Plavix [Clopidogrel Bisulfate]     Brain hemorrhage prev while on plavix and aspirin    Patient Measurements: Height: 5\' 9"  (175.3 cm) Weight: 68.2 kg (150 lb 5.7 oz) IBW/kg (Calculated) : 70.7   Vital Signs: Temp: 98 F (36.7 C) (07/17 0800) Temp Source: Axillary (07/17 0800) BP: 124/47 (07/17 0402) Pulse Rate: 68 (07/17 1156)  Labs: Recent Labs    11/07/22 1407 11/07/22 1414 11/07/22 1415 11/07/22 1430 11/07/22 1617 11/07/22 1730 11/08/22 0629 11/08/22 1008 11/09/22 0549  HGB 9.7*   < > 10.9*  --   --  9.2* 8.3*  --  8.7*  HCT 35.9*   < > 32.0*  --   --  27.0* 29.7*  --  30.6*  PLT 305  --   --   --   --   --  207  --  200  LABPROT 36.2*  --   --   --   --   --   --  38.8* 29.7*  INR 3.6*  --   --   --   --   --   --  3.9* 2.8*  CREATININE 1.18  --  1.30*  --   --   --  1.13  --  1.14  TROPONINIHS  --   --   --  76* 89*  --   --   --   --    < > = values in this interval not displayed.    Estimated Creatinine Clearance: 49 mL/min (by C-G formula based on SCr of 1.14 mg/dL).   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 06/2011 Echo: EF 35-40%; b. 06/2012 Echo: EF 50%; c. 12/2016 Echo: EF 55-60%, no rwma, GRI DD, mild AS/MR, nl RV fxn, nl RVSP; d. 02/2021 Echo: EF 55%, GrII DD. Mild MR. Mild to mod TR. Sev AS by VTI.   AAA (abdominal aortic aneurysm) (HCC)    a. 05/2020 Abd u/s: 3.6cm.   Aortic calcification (HCC) 05/01/2013   Bilateral renal artery stenosis (HCC)    a. 06/2013 Angio/PTA: LRA: 95 (5x18 Herculink stent), RRA 60ost; b. 04/2021 Angio: mod RRA stenosis w/ patent LRA stent.   Carotid arterial disease (HCC)    a. 05/2020 Carotid U/S: RICA 1-39%, LICA 40-59%   CHF (congestive heart failure) (HCC)    Chronic kidney disease    COPD (chronic  obstructive pulmonary disease) (HCC)    Coronary artery disease    a. 2013 s/p CABG x 3 (LIMA->LAD, VG->OM, VG->RPDA; b. 06/2012 MV: no ischemia; c. 02/2021 Cath: LM 100, RCA 90ost, 147m, free LIMA->LAD nl, VG->OM2 nl, VG->RPDA nl. Mod AS (mean grad , AVA 1.1cm^2). RHC w/ elev filling pressures.   History of prior cigarette smoking 04/11/2008   Qualifier: Diagnosis of  By: Ermalene Searing MD, Amy     Hyperlipidemia    Hypertension    Hypothyroidism    Intraventricular hemorrhage (HCC) 07/12/2012   Ischemic cardiomyopathy    a. 06/2011 Echo: EF 35-40%; b. 06/2012 Echo: EF 50%; c. 12/2016 Echo: EF 55-60%, no rwma, GRI DD; d. 02/2021 Echo: Ef 55%, GrII DD.   Moderate aortic stenosis    a. 02/2021 Echo: EF 55%, GrII DD. Sev Ca2+ of AoV. Sev AS w/ AVA by VTI 0.79cm^2; b. 02/2021 Cath: Mod AS w/  mean grad . AVA 1.1cm^2.   PAF (paroxysmal atrial fibrillation) (HCC)    Peripheral arterial disease (HCC)    a. Previous left lower extremity stenting by Dr. Evette Cristal;  b. 12/2012 s/p bilat ostial common iliac stenting; c. 04/2021 Angio: Patent LRA stent, mod RRA stenosis. Small AAA. Sev plaque RSFA 2/ subtl occl of inf branch of profunda. Diff Ca2+ SFA dzs w/ collats from profunda and 3V runoff. Diffuse LSFA dzs w/ collats from profunda. Subtl PT dzs. 3V runoff-->Med Rx.   Right leg DVT (HCC) 08/13/2012   Subclavian artery stenosis, left (HCC) 05/2012   Status post stenting of the ostium and self-expanding stent placement to the left axillary artery   Venous insufficiency       Assessment: 82 yo male with COPD exacerbation, PNA and COVID positive. He is on warfarin PTA for history of DVT (IVC in place). Pharmacy consulted to dose warfarin. He was also on full dose enoxaparin PTA (last dose given 11/07/22)  Today his INR is 2.8,  down from 3.9 yesterday. Warfarin dose held x 2 days 7/15-7/16.  Amiodarone continues from prior to admission. Hgb 8.3>8.7,  pltc wnl stable. No bleeding reported.   Home  warfarin dose: 2.5mg /day except take none on Saturday, last dose taken PTA 7/14.   Goal of Therapy:  INR 2-3 Monitor platelets by anticoagulation protocol: Yes   Plan:  -Give Warfarin 2mg  x1 today -Daily PT/INR  Thank you for allowing pharmacy to be part of this patients care team. Noah Delaine, RPh Clinical Pharmacist [phone] **Pharmacist phone directory can now be found on [email or web address] ([email or web address]).  Listed under [facility].

## 2022-11-09 NOTE — Progress Notes (Signed)
PROGRESS NOTE    [name]  EAV:[id number] DOB: 1940-06-25 DOA: 11/07/2022 PCP: [name], MD    Chief Complaint  Patient presents with   Shortness of Breath    Brief Narrative:   82 year old man who presented to [facility] ED 7/15 for SOB. PMHx significant for HTN, HLD, CAD (s/p CABG), PAF with history of DVT (s/p IVC filter, on Coumadin), HFrEF (Echo 6/28 with EF 55-60%), COPD, AAA, bilateral RAS, CKD stage IIIb, subclavian artery stenosis and severe PAD (s/p R iliofemoral endarterectomy, popliteal endarterectomy and R TMA with [facility] 6/25, 7/2). Recently admitted to [facility] 6/18 - 7/8 for sepsis due to RLE infection (s/p surgical intervention), discharged to rehab [facility]). EMS called for SOB/respiratory distress; per report SpO2 73% on RA, improved to 100% with NRB mask. Per facility records, patient had been on ceftriaxone/clindamycin x 5 days for PNA treatment.  - On ED arrival, patient temp was 99.39F with HR 80, RR 23, BP 117/93, SpO2 100% on NRB, transitioned to BiPAP. Labs were notable for WBC 11.9, Hgb 9.7, Plt 305. INR 3.6 (on Coumadin). Na 145, K 4.7, CO2 25, Cr 1.18 (baseline); LFTs WNL. BNP 997 (585), trop 76 > 89, suspect demand-related. COVID positive. CXR demonstrated worsening RML/RLL PNA with R pleural effusion. Broad-spectrum antibiotic coverage initiated.  Patient was admitted to [facility] continue to continuous BiPAP requirement, he transferred to [facility] service 7/17.   Assessment & Plan:   Principal Problem:   Multifocal pneumonia  Acute respiratory failure Multifocal PNA, HCAP vs. COVID-associated COVID-19 infection COPD with acute exacerbation in the setting of PNA -Admitted to ICU due to continuous BiPAP, currently on intermittent BiPAP daytime and BiPAP continue with nighttime, was kept on oxygen earlier today, but developed dyspnea, back on BiPAP. - Scheduled nebs - IV steroids, will change hydrocortisone to Solu-Medrol. - Pulmonary hygiene > IS/FV - ABX  cefepime continue for HAP coverage. DC vanco with negative MRSA PCR. Low threshold to to deescalate/DC with low PCT.  - F/u Cx data   Chronic HFrEF; has had recovery of ejection fraction to 55 to 60% on most recent Echo Severe aortic valve stenosis - Cardiac monitoring - Diuresis as tolerated, monitor BMP, will give 20 mg of IV Lasix today - Outpatient Cardiology f/u for AS, needs to recover from current hospitalization prior to consideration of intervention   Paroxysmal atrial fibrillation anticoagulated with Coumadin PTA. Failed DCCV January 2024. History of DVT Paroxysmal Afib - Continue amiodarone PO - Warfarin per pharmacy - Cardiac monitoring - Optimize electrolytes for K > 4, Mg > 2   Recent critical limb ischemia of right lower extremity with gangrene Extensive peripheral arterial disease - S/p R iliofem EA/popliteal EA 6/25, R TMA 7/2 - Outpatient management per VVS - ASA/statin - Continue Coumadin - PT/OT -Wound looks clean.  The pictures in the media section.   Abdominal aortic aneurysm- 5.5 cm on recent imaging, enlarged per prior image - Continue ASA/statin - Outpatient monitoring/mgmt per [facility]   CKD stage IIIa, stable - Trend BMP - Replete electrolytes as indicated - Monitor I&Os   Hypothyroidism - Continue levothyroxine   Anemia - Trend H&H - Monitor for signs of active bleeding - Transfuse for Hgb < 7.0 or hemodynamically significant bleeding    DVT prophylaxis: On warfarin Code Status: full Family Communication: D/W daughter at bedside Disposition:   Status is: Inpatient    Consultants:  PCCM   Subjective:  He felt initially better today once weaned off BiPAP, but in couple  hours he developed significant dyspnea, requiring to go back on BiPAP.  Objective: Vitals:   11/09/22 0134 11/09/22 0402 11/09/22 0800 11/09/22 0831  BP:  (!) 124/47    Pulse: (!) 57 63    Resp:  (!) 22    Temp:  97.9 F (36.6 C) 98 F (36.7 C)   TempSrc:   Axillary Axillary   SpO2: 100% 100%  99%  Weight:      Height:        Intake/Output Summary (Last 24 hours) at 11/09/2022 1119 Last data filed at 11/08/2022 1830 Gross per 24 hour  Intake 481.27 ml  Output 1600 ml  Net -1118.73 ml   Filed Weights   11/07/22 1401  Weight: 68.2 kg    Examination:  Awake Alert, Oriented X 3, pale, deconditioned, Symmetrical Chest wall movement, manage air entry bilaterally with some use of accessory muscles RRR,No Gallops,Rubs or new Murmurs, No Parasternal Heave +ve B.Sounds, Abd Soft, No tenderness, No rebound - guarding or rigidity. No Cyanosis, Clubbing or edema, right lower extremity status post TMA staples present, recent bypass surgery site looks clean, please see pictures below         Data Reviewed: I have personally reviewed following labs and imaging studies  CBC: Recent Labs  Lab 11/07/22 1407 11/07/22 1414 11/07/22 1415 11/07/22 1730 11/08/22 0629 11/09/22 0549  WBC 11.9*  --   --   --  6.6 7.9  NEUTROABS 10.8*  --   --   --   --   --   HGB 9.7* 10.9* 10.9* 9.2* 8.3* 8.7*  HCT 35.9* 32.0* 32.0* 27.0* 29.7* 30.6*  MCV 101.4*  --   --   --  98.3 98.7  PLT 305  --   --   --  207 200    Basic Metabolic Panel: Recent Labs  Lab 11/07/22 1407 11/07/22 1414 11/07/22 1415 11/07/22 1730 11/08/22 0629 11/09/22 0549  NA 145 147* 147* 147* 144 145  K 4.7 4.9 4.9 4.5 4.0 4.4  CL 115*  --  115*  --  115* 109  CO2 25  --   --   --  22 27  GLUCOSE 120*  --  114*  --  96 91  BUN 28*  --  28*  --  25* 28*  CREATININE 1.18  --  1.30*  --  1.13 1.14  CALCIUM 8.5*  --   --   --  7.8* 8.4*  MG 2.3  --   --   --  2.1 2.2  PHOS  --   --   --   --  3.4 2.6    GFR: Estimated Creatinine Clearance: 49 mL/min (by C-G formula based on SCr of 1.14 mg/dL).  Liver Function Tests: Recent Labs  Lab 11/07/22 1407  AST 19  ALT 22  ALKPHOS 61  BILITOT 0.6  PROT 5.9*  ALBUMIN 2.6*    CBG: Recent Labs  Lab 11/08/22 1812  11/08/22 2012 11/09/22 0010 11/09/22 0401 11/09/22 0906  GLUCAP 95 84 131* 113* 95     Recent Results (from the past 240 hour(s))  Resp panel by RT-PCR (RSV, Flu A&B, Covid) Anterior Nasal Swab     Status: Abnormal   Collection Time: 11/07/22  2:02 PM   Specimen: Anterior Nasal Swab  Result Value Ref Range Status   SARS Coronavirus 2 by RT PCR POSITIVE (A) NEGATIVE Final   Influenza A by PCR NEGATIVE NEGATIVE Final   Influenza B by  PCR NEGATIVE NEGATIVE Final    Comment: (NOTE) The Xpert Xpress SARS-CoV-2/FLU/RSV plus assay is intended as an aid in the diagnosis of influenza from Nasopharyngeal swab specimens and should not be used as a sole basis for treatment. Nasal washings and aspirates are unacceptable for Xpert Xpress SARS-CoV-2/FLU/RSV testing.  Fact Sheet for Patients: BloggerCourse.com  Fact Sheet for Healthcare Providers: SeriousBroker.it  This test is not yet approved or cleared by the [place] FDA and has been authorized for detection and/or diagnosis of SARS-CoV-2 by FDA under an Emergency Use Authorization (EUA). This EUA will remain in effect (meaning this test can be used) for the duration of the COVID-19 declaration under Section 564(b)(1) of the Act, 21 U.S.C. section 360bbb-3(b)(1), unless the authorization is terminated or revoked.     Resp Syncytial Virus by PCR NEGATIVE NEGATIVE Final    Comment: (NOTE) Fact Sheet for Patients: BloggerCourse.com  Fact Sheet for Healthcare Providers: SeriousBroker.it  This test is not yet approved or cleared by the [place] FDA and has been authorized for detection and/or diagnosis of SARS-CoV-2 by FDA under an Emergency Use Authorization (EUA). This EUA will remain in effect (meaning this test can be used) for the duration of the COVID-19 declaration under Section 564(b)(1) of the Act, 21 U.S.C. section  360bbb-3(b)(1), unless the authorization is terminated or revoked.  Performed at [facility], [address], [name], [place] [address]   Blood culture (routine x 2)     Status: None (Preliminary result)   Collection Time: 11/07/22  3:06 PM   Specimen: BLOOD  Result Value Ref Range Status   Specimen Description BLOOD RIGHT ANTECUBITAL  Final   Special Requests   Final    BOTTLES DRAWN AEROBIC AND ANAEROBIC Blood Culture results may not be optimal due to an excessive volume of blood received in culture bottles   Culture   Final    NO GROWTH 2 DAYS Performed at [facility], [address] [place], [place] [address]    Report Status PENDING  Incomplete  Blood culture (routine x 2)     Status: None (Preliminary result)   Collection Time: 11/07/22  3:07 PM   Specimen: BLOOD  Result Value Ref Range Status   Specimen Description BLOOD LEFT ANTECUBITAL  Final   Special Requests   Final    BOTTLES DRAWN AEROBIC AND ANAEROBIC Blood Culture results may not be optimal due to an excessive volume of blood received in culture bottles   Culture   Final    NO GROWTH 2 DAYS Performed at [facility], [address] [place], [place] [address]    Report Status PENDING  Incomplete  MRSA Next Gen by PCR, Nasal     Status: None   Collection Time: 11/07/22  8:15 PM   Specimen: Nasal Mucosa; Nasal Swab  Result Value Ref Range Status   MRSA by PCR Next Gen NOT DETECTED NOT DETECTED Final    Comment: (NOTE) The GeneXpert MRSA Assay (FDA approved for NASAL specimens only), is one component of a comprehensive MRSA colonization surveillance program. It is not intended to diagnose MRSA infection nor to guide or monitor treatment for MRSA infections. Test performance is not FDA approved in patients less than 13 years old. Performed at [facility], [address] [place] [address] [place], [place] [address]          Radiology Studies: DG Chest Port 1 View  Result Date:  11/08/2022 CLINICAL DATA:  Follow-up  pneumonia EXAM: PORTABLE CHEST 1 VIEW COMPARISON:  11/07/2022 FINDINGS: Cardiac shadow is stable. Aortic calcifications are seen. Postsurgical changes are again noted. Diffuse interstitial changes are again noted slightly increased consistent with worsening edema. Persistent right basilar infiltrate is seen. No pneumothorax is noted. IMPRESSION: Worsening interstitial edema. Stable right basilar infiltrate is noted. Electronically Signed   By: Alcide Clever M.D.   On: 11/08/2022 10:08   DG Chest Port 1 View  Result Date: 11/07/2022 CLINICAL DATA:  Shortness of breath.  Pneumonia. EXAM: PORTABLE CHEST 1 VIEW COMPARISON:  10/29/2022 FINDINGS: Previous median sternotomy and CABG. Cardiomegaly and aortic atherosclerosis as seen previously. Previous brachiocephalic vessel stent. Worsening of infiltrate in the mid and lower lung on the right, with a right effusion. No focal infiltrate seen on the left. Question pulmonary venous hypertension/fluid overload in general. IMPRESSION: 1. Worsening of infiltrate in the mid and lower lung on the right, with a right effusion. 2. Question pulmonary venous hypertension/fluid overload in general. 3. Previous CABG. Cardiomegaly. Aortic atherosclerosis. Electronically Signed   By: Paulina Fusi M.D.   On: 11/07/2022 14:42        Scheduled Meds:  albuterol  10 mg Nebulization Once   amiodarone  200 mg Oral BID   aspirin EC  81 mg Oral Q0600   Chlorhexidine Gluconate Cloth  6 each Topical Daily   hydrocortisone sod succinate (SOLU-CORTEF) inj  100 mg Intravenous BID   insulin aspart  0-9 Units Subcutaneous Q4H   ipratropium-albuterol  3 mL Nebulization Q6H   levothyroxine  150 mcg Oral Q0600   mouth rinse  15 mL Mouth Rinse 4 times per day   Continuous Infusions:  ceFEPime (MAXIPIME) IV 2 g (11/09/22 0920)     LOS: 2 days        Huey Bienenstock, MD [facility]   To contact the attending provider between 7A-7P  or the covering provider during after hours 7P-7A, please log into the web site [email or web address] and access using universal [place] password for that web site. If you do not have the password, please call the hospital operator.  11/09/2022, 11:19 AM

## 2022-11-10 DIAGNOSIS — J189 Pneumonia, unspecified organism: Secondary | ICD-10-CM | POA: Diagnosis not present

## 2022-11-10 DIAGNOSIS — J9621 Acute and chronic respiratory failure with hypoxia: Secondary | ICD-10-CM | POA: Diagnosis not present

## 2022-11-10 DIAGNOSIS — J441 Chronic obstructive pulmonary disease with (acute) exacerbation: Secondary | ICD-10-CM

## 2022-11-10 DIAGNOSIS — J9622 Acute and chronic respiratory failure with hypercapnia: Secondary | ICD-10-CM | POA: Diagnosis not present

## 2022-11-10 LAB — GLUCOSE, CAPILLARY
Glucose-Capillary: 226 mg/dL — ABNORMAL HIGH (ref 70–99)
Glucose-Capillary: 124 mg/dL — ABNORMAL HIGH (ref 70–99)
Glucose-Capillary: 134 mg/dL — ABNORMAL HIGH (ref 70–99)
Glucose-Capillary: 125 mg/dL — ABNORMAL HIGH (ref 70–99)
Glucose-Capillary: 123 mg/dL — ABNORMAL HIGH (ref 70–99)

## 2022-11-10 LAB — BASIC METABOLIC PANEL
Anion gap: 3 — ABNORMAL LOW (ref 5–15)
BUN: 24 mg/dL — ABNORMAL HIGH (ref 8–23)
CO2: 29 mmol/L (ref 22–32)
Calcium: 8 mg/dL — ABNORMAL LOW (ref 8.9–10.3)
Chloride: 110 mmol/L (ref 98–111)
Creatinine, Ser: 1.14 mg/dL (ref 0.61–1.24)
GFR, Estimated: >60 mL/min (ref >60)
Glucose, Bld: 138 mg/dL — ABNORMAL HIGH (ref 70–99)
Potassium: 4.1 mmol/L (ref 3.5–5.1)
Sodium: 142 mmol/L (ref 135–145)

## 2022-11-10 LAB — CBC
HCT: 33.3 % — ABNORMAL LOW (ref 39.0–52.0)
Hemoglobin: 9.3 g/dL — ABNORMAL LOW (ref 13.0–17.0)
MCH: 26.9 pg (ref 26.0–34.0)
MCHC: 27.9 g/dL — ABNORMAL LOW (ref 30.0–36.0)
MCV: 96.2 fL (ref 80.0–100.0)
Platelets: 195 10*3/uL (ref 150–400)
RBC: 3.46 MIL/uL — ABNORMAL LOW (ref 4.22–5.81)
RDW: 21.4 % — ABNORMAL HIGH (ref 11.5–15.5)
WBC: 5.7 10*3/uL (ref 4.0–10.5)
nRBC: 0.4 % — ABNORMAL HIGH (ref 0.0–0.2)

## 2022-11-10 LAB — PROCALCITONIN: Procalcitonin: 0.1 ng/mL

## 2022-11-10 LAB — BRAIN NATRIURETIC PEPTIDE: B Natriuretic Peptide: 1196.2 pg/mL — ABNORMAL HIGH (ref 0.0–100.0)

## 2022-11-10 LAB — PROTIME-INR
INR: 2.1 — ABNORMAL HIGH (ref 0.8–1.2)
Prothrombin Time: 24.2 seconds — ABNORMAL HIGH (ref 11.4–15.2)

## 2022-11-10 MED ORDER — WARFARIN SODIUM 2.5 MG PO TABS
2.5000 mg | ORAL_TABLET | Freq: Once | ORAL | Status: AC
  Administered 2022-11-10: 2.5 mg via ORAL
  Filled 2022-11-10: qty 1

## 2022-11-10 MED ORDER — FUROSEMIDE 10 MG/ML IJ SOLN
40.0000 mg | Freq: Once | INTRAMUSCULAR | Status: AC
  Administered 2022-11-10: 40 mg via INTRAVENOUS
  Filled 2022-11-10: qty 4

## 2022-11-10 NOTE — Plan of Care (Signed)
  Problem: Education: Goal: Understanding of CV disease, CV risk reduction, and recovery process will improve Outcome: Progressing   Problem: Education: Goal: Knowledge of prescribed regimen will improve Outcome: Progressing   Problem: Safety: Goal: Ability to remain free from injury will improve Outcome: Progressing

## 2022-11-10 NOTE — Progress Notes (Signed)
ANTICOAGULATION CONSULT NOTE - follow up  Pharmacy Consult for warfarin Indication: history DVT  Allergies  Allergen Reactions   Plavix [Clopidogrel Bisulfate]     Brain hemorrhage prev while on plavix and aspirin    Patient Measurements: Height: 5\' 9"  (175.3 cm) Weight: 73.6 kg (162 lb 4.1 oz) IBW/kg (Calculated) : 70.7   Vital Signs: Temp: 98 F (36.7 C) (07/18 1137) BP: 138/56 (07/18 1548) Pulse Rate: 60 (07/18 1548)  Labs: Recent Labs    11/08/22 0629 11/08/22 1008 11/09/22 0549 11/10/22 0352  HGB 8.3*  --  8.7* 9.3*  HCT 29.7*  --  30.6* 33.3*  PLT 207  --  200 195  LABPROT  --  38.8* 29.7* 24.2*  INR  --  3.9* 2.8* 2.1*  CREATININE 1.13  --  1.14 1.14    Estimated Creatinine Clearance: 50.8 mL/min (by C-G formula based on SCr of 1.14 mg/dL).   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 06/2011 Echo: EF 35-40%; b. 06/2012 Echo: EF 50%; c. 12/2016 Echo: EF 55-60%, no rwma, GRI DD, mild AS/MR, nl RV fxn, nl RVSP; d. 02/2021 Echo: EF 55%, GrII DD. Mild MR. Mild to mod TR. Sev AS by VTI.   AAA (abdominal aortic aneurysm) (HCC)    a. 05/2020 Abd u/s: 3.6cm.   Aortic calcification (HCC) 05/01/2013   Bilateral renal artery stenosis (HCC)    a. 06/2013 Angio/PTA: LRA: 95 (5x18 Herculink stent), RRA 60ost; b. 04/2021 Angio: mod RRA stenosis w/ patent LRA stent.   Carotid arterial disease (HCC)    a. 05/2020 Carotid U/S: RICA 1-39%, LICA 40-59%   CHF (congestive heart failure) (HCC)    Chronic kidney disease    COPD (chronic obstructive pulmonary disease) (HCC)    Coronary artery disease    a. 2013 s/p CABG x 3 (LIMA->LAD, VG->OM, VG->RPDA; b. 06/2012 MV: no ischemia; c. 02/2021 Cath: LM 100, RCA 90ost, 156m, free LIMA->LAD nl, VG->OM2 nl, VG->RPDA nl. Mod AS (mean grad , AVA 1.1cm^2). RHC w/ elev filling pressures.   History of prior cigarette smoking 04/11/2008   Qualifier:  Diagnosis of  By: Ermalene Searing MD, Amy     Hyperlipidemia    Hypertension    Hypothyroidism    Intraventricular hemorrhage (HCC) 07/12/2012   Ischemic cardiomyopathy    a. 06/2011 Echo: EF 35-40%; b. 06/2012 Echo: EF 50%; c. 12/2016 Echo: EF 55-60%, no rwma, GRI DD; d. 02/2021 Echo: Ef 55%, GrII DD.   Moderate aortic stenosis    a. 02/2021 Echo: EF 55%, GrII DD. Sev Ca2+ of AoV. Sev AS w/ AVA by VTI 0.79cm^2; b. 02/2021 Cath: Mod AS w/ mean grad . AVA 1.1cm^2.   PAF (paroxysmal atrial fibrillation) (HCC)    Peripheral arterial disease (HCC)    a. Previous left lower extremity stenting by Dr. Evette Cristal;  b. 12/2012 s/p bilat ostial common iliac stenting; c. 04/2021 Angio: Patent LRA stent, mod RRA stenosis. Small AAA. Sev plaque RSFA 2/ subtl occl of inf branch of profunda. Diff Ca2+ SFA dzs w/ collats from profunda and 3V runoff. Diffuse LSFA dzs w/ collats from profunda. Subtl PT dzs. 3V runoff-->Med Rx.   Right leg DVT (HCC) 08/13/2012   Subclavian artery stenosis, left (HCC) 05/2012   Status post stenting of the ostium and self-expanding stent placement to the left axillary artery   Venous insufficiency       Assessment: 82 yo male with  COPD exacerbation, PNA and COVID positive. He is on warfarin PTA for history of DVT (IVC in place). Pharmacy consulted to dose warfarin. He was also on full dose enoxaparin PTA (last dose given 11/07/22)  Today his INR is 2.1 therapeutic.  Warfarin dose held x 2 days  on 7/15 and7/16.  Amiodarone continues from prior to admission.  On ASA 81mg  daily  Hgb 8s-9s stable,  pltc wnl stable. No bleeding reported.   Home warfarin dose: 2.5mg /day except take none on Saturday, last dose taken PTA 7/14.   Goal of Therapy:  INR 2-3 Monitor platelets by anticoagulation protocol: Yes   Plan:  -Give Warfarin 2.5mg  x1 today -Daily PT/INR  Thank you for allowing pharmacy to be part of this patients care team. Noah Delaine, RPh Clinical  Pharmacist [phone] **Pharmacist phone directory can now be found on [email or web address] ([email or web address]).  Listed under [facility].

## 2022-11-10 NOTE — Progress Notes (Signed)
Placed patient on BIPAP per pt request. Currently on  HHFNC 45L @40 % FIO2. RR-20. No distress noted. Patient states difficulty breathing.

## 2022-11-10 NOTE — Plan of Care (Signed)
  Problem: Education: Goal: Understanding of CV disease, CV risk reduction, and recovery process will improve Outcome: Progressing Goal: Individualized Educational Video(s) Outcome: Progressing   Problem: Activity: Goal: Ability to return to baseline activity level will improve Outcome: Progressing   Problem: Cardiovascular: Goal: Ability to achieve and maintain adequate cardiovascular perfusion will improve Outcome: Progressing Goal: Vascular access site(s) Level 0-1 will be maintained Outcome: Progressing   Problem: Health Behavior/Discharge Planning: Goal: Ability to safely manage health-related needs after discharge will improve Outcome: Progressing   Problem: Education: Goal: Knowledge of prescribed regimen will improve Outcome: Progressing   Problem: Activity: Goal: Ability to tolerate increased activity will improve Outcome: Progressing   Problem: Bowel/Gastric: Goal: Gastrointestinal status for postoperative course will improve Outcome: Progressing   Problem: Clinical Measurements: Goal: Postoperative complications will be avoided or minimized Outcome: Progressing Goal: Signs and symptoms of graft occlusion will improve Outcome: Progressing   Problem: Skin Integrity: Goal: Demonstration of wound healing without infection will improve Outcome: Progressing   Problem: Education: Goal: Knowledge of General Education information will improve Description: Including pain rating scale, medication(s)/side effects and non-pharmacologic comfort measures Outcome: Progressing   Problem: Health Behavior/Discharge Planning: Goal: Ability to manage health-related needs will improve Outcome: Progressing   Problem: Clinical Measurements: Goal: Ability to maintain clinical measurements within normal limits will improve Outcome: Progressing Goal: Will remain free from infection Outcome: Progressing Goal: Diagnostic test results will improve Outcome: Progressing Goal:  Respiratory complications will improve Outcome: Progressing Goal: Cardiovascular complication will be avoided Outcome: Progressing   Problem: Activity: Goal: Risk for activity intolerance will decrease Outcome: Progressing   Problem: Nutrition: Goal: Adequate nutrition will be maintained Outcome: Progressing   Problem: Coping: Goal: Level of anxiety will decrease Outcome: Progressing   Problem: Elimination: Goal: Will not experience complications related to bowel motility Outcome: Progressing Goal: Will not experience complications related to urinary retention Outcome: Progressing   Problem: Pain Managment: Goal: General experience of comfort will improve Outcome: Progressing   Problem: Safety: Goal: Ability to remain free from injury will improve Outcome: Progressing   Problem: Skin Integrity: Goal: Risk for impaired skin integrity will decrease Outcome: Progressing   

## 2022-11-10 NOTE — Consult Note (Signed)
   [facility] CM Inpatient Consult   11/10/2022  [name] Mar 17, 1941 [id number]  [facility] [[facility]]  Accountable Care Organization [ACO] Patient: Medicare ACO REACH  [facility] Liaison remote coverage review to preserve PPE for patient admitted to [facility] patient on COVID + airborne precaution with pneumonia, respiratory failure with hypoxia  Primary Care Provider:  [name], MD with [facility] listed to provide the transition of care follow up   Patient screened for less than 7 days readmission hospitalization with noted extreme high risk score for unplanned readmission risk and to assess for potential [facility]  [[facility]] Care Management service needs for post hospital transition for care coordination.  Review of patient's electronic medical record reveals patient has been referred for Care Coordination in the past noted at that time services were declined [06/25/22]. Patient also from SNF noted [facility].  Plan:  Continue to follow progress and disposition to assess for post hospital community care coordination/management needs.  Referral request for community care coordination:  pending progress and disposition/needs.  Of note, [facility] Care Management/Population Health does not replace or interfere with any arrangements made by the Inpatient Transition of Care team.  For questions contact:   Charlesetta Shanks, RN BSN CCM [facility]  [phone] business mobile phone Toll free office [phone]  *Concierge Line  [phone] Fax number: [phone] [email or web address]

## 2022-11-10 NOTE — TOC Progression Note (Signed)
Transition of Care [facility]) - Progression Note    Patient Details  Name: CAROLD EISNER MRN: [id number] Date of Birth: 07/05/1940  Transition of Care [facility]) CM/SW Contact  Mearl Latin, LCSW Phone Number: 11/10/2022, 12:05 PM  Clinical Narrative:    Phineas Semen reported they are aware of COVID + status and patient has a private isolation room at SNF so they can accept him back when medically stable. Still currently on high oxygen amounts.    Expected Discharge Plan: Skilled Nursing Facility Barriers to Discharge: Continued Medical Work up  Expected Discharge Plan and Services In-house Referral: Clinical Social Work   Post Acute Care Choice: Skilled Nursing Facility Living arrangements for the past 2 months: Single Family Home                                       Social Determinants of Health (SDOH) Interventions SDOH Screenings   Food Insecurity: No Food Insecurity (11/09/2022)  Housing: Low Risk  (11/09/2022)  Transportation Needs: No Transportation Needs (11/09/2022)  Utilities: Not At Risk (11/09/2022)  Alcohol Screen: Low Risk  (07/13/2022)  Depression (PHQ2-9): Low Risk  (07/13/2022)  Financial Resource Strain: Low Risk  (07/13/2022)  Physical Activity: Insufficiently Active (07/13/2022)  Social Connections: Moderately Integrated (07/13/2022)  Stress: No Stress Concern Present (07/13/2022)  Tobacco Use: Medium Risk (11/07/2022)    Readmission Risk Interventions    11/10/2022   12:01 PM  Readmission Risk Prevention Plan  Transportation Screening Complete  Medication Review (RN Care Manager) Complete  PCP or Specialist appointment within 3-5 days of discharge Complete  HRI or Home Care Consult Complete  SW Recovery Care/Counseling Consult Complete  Palliative Care Screening Not Applicable  Skilled Nursing Facility Complete

## 2022-11-10 NOTE — Progress Notes (Signed)
PROGRESS NOTE    [name]  VOZ:[id number] DOB: 09-27-1940 DOA: 11/07/2022 PCP: [name], MD    Chief Complaint  Patient presents with   Shortness of Breath    Brief Narrative:   82 year old man who presented to [facility] ED 7/15 for SOB. PMHx significant for HTN, HLD, CAD (s/p CABG), PAF with history of DVT (s/p IVC filter, on Coumadin), HFrEF (Echo 6/28 with EF 55-60%), COPD, AAA, bilateral RAS, CKD stage IIIb, subclavian artery stenosis and severe PAD (s/p R iliofemoral endarterectomy, popliteal endarterectomy and R TMA with [facility] 6/25, 7/2). Recently admitted to [facility] 6/18 - 7/8 for sepsis due to RLE infection (s/p surgical intervention), discharged to rehab [facility]). EMS called for SOB/respiratory distress; per report SpO2 73% on RA, improved to 100% with NRB mask. Per facility records, patient had been on ceftriaxone/clindamycin x 5 days for PNA treatment.  - On ED arrival, patient temp was 99.76F with HR 80, RR 23, BP 117/93, SpO2 100% on NRB, transitioned to BiPAP. Labs were notable for WBC 11.9, Hgb 9.7, Plt 305. INR 3.6 (on Coumadin). Na 145, K 4.7, CO2 25, Cr 1.18 (baseline); LFTs WNL. BNP 997 (585), trop 76 > 89, suspect demand-related. COVID positive. CXR demonstrated worsening RML/RLL PNA with R pleural effusion. Broad-spectrum antibiotic coverage initiated.  Patient was admitted to [facility] continue to continuous BiPAP requirement, he transferred to [facility] service 7/17.   Assessment & Plan:   Principal Problem:   Multifocal pneumonia  Acute respiratory failure Multifocal PNA, HCAP vs. COVID-associated COVID-19 infection COPD with acute exacerbation in the setting of PNA -Admitted to ICU due to continuous BiPAP, currently on intermittent BiPAP daytime and BiPAP continue with nighttime, still requiring significant amount of BiPAP daily, as he reports significant dyspnea, he is tachypneic when he is off BiPAP, so trying to Break during mealtimes. - Scheduled nebs - IV  steroids, changed hydrocortisone to Solu-Medrol. - Pulmonary hygiene > IS/FV - ABX cefepime continue for HAP coverage. DC vanco with negative MRSA PCR.  - F/u Cx data   Acute on chronic HFrEF; has had recovery of ejection fraction to 55 to 60% on most recent Echo Severe aortic valve stenosis - Cardiac monitoring -Difficultly elevated BNP, trending up, will give 40 mg of IV Lasix today, will dose daily. - Outpatient Cardiology f/u for AS, needs to recover from current hospitalization prior to consideration of intervention   Paroxysmal atrial fibrillation anticoagulated with Coumadin PTA. Failed DCCV January 2024. History of DVT Paroxysmal Afib - Continue amiodarone PO - Warfarin per pharmacy - Cardiac monitoring - Optimize electrolytes for K > 4, Mg > 2   Recent critical limb ischemia of right lower extremity with gangrene Extensive peripheral arterial disease - S/p R iliofem EA/popliteal EA 6/25, R TMA 7/2 - Outpatient management per VVS - ASA/statin - Continue Coumadin - PT/OT -Wound looks clean.  The pictures in the media section.   Abdominal aortic aneurysm- 5.5 cm on recent imaging, enlarged per prior image - Continue ASA/statin - Outpatient monitoring/mgmt per [facility]   CKD stage IIIa, stable - Trend BMP - Replete electrolytes as indicated - Monitor I&Os   Hypothyroidism - Continue levothyroxine   Anemia - Trend H&H - Monitor for signs of active bleeding - Transfuse for Hgb < 7.0 or hemodynamically significant bleeding    DVT prophylaxis: On warfarin Code Status: full Family Communication: D/W daughter at bedside 7/17 Disposition:   Status is: Inpatient    Consultants:  PCCM   Subjective:  Quired BiPAP  overnight and most of the day yesterday, he reports significant dyspnea once off BiPAP. Objective: Vitals:   11/10/22 0303 11/10/22 0400 11/10/22 0500 11/10/22 1137  BP: (!) 137/53 (!) 136/51    Pulse: (!) 59 (!) 58 (!) 59   Resp: 19 18 (!) 22   Temp:     98 F (36.7 C)  TempSrc:      SpO2: 100% 100% (!) 82%   Weight:   73.6 kg   Height:        Intake/Output Summary (Last 24 hours) at 11/10/2022 1344 Last data filed at 11/09/2022 2006 Gross per 24 hour  Intake 200.14 ml  Output 2300 ml  Net -2099.86 ml   Filed Weights   11/07/22 1401 11/10/22 0500  Weight: 68.2 kg 73.6 kg    Examination:  Awake Alert, Oriented X 3, frail, deconditioned Symmetrical Chest wall movement, Neck, diminished air entry at the bases RRR,No Gallops,Rubs or new Murmurs, No Parasternal Heave +ve B.Sounds, Abd Soft, No tenderness, No rebound - guarding or rigidity. No Cyanosis, Clubbing or edema, right lower extremity status post TMA staples present, recent bypass surgery site looks clean, please see pictures below  Pictures on 7/17         Data Reviewed: I have personally reviewed following labs and imaging studies  CBC: Recent Labs  Lab 11/07/22 1407 11/07/22 1414 11/07/22 1415 11/07/22 1730 11/08/22 0629 11/09/22 0549 11/10/22 0352  WBC 11.9*  --   --   --  6.6 7.9 5.7  NEUTROABS 10.8*  --   --   --   --   --   --   HGB 9.7*   < > 10.9* 9.2* 8.3* 8.7* 9.3*  HCT 35.9*   < > 32.0* 27.0* 29.7* 30.6* 33.3*  MCV 101.4*  --   --   --  98.3 98.7 96.2  PLT 305  --   --   --  207 200 195   < > = values in this interval not displayed.    Basic Metabolic Panel: Recent Labs  Lab 11/07/22 1407 11/07/22 1414 11/07/22 1415 11/07/22 1730 11/08/22 0629 11/09/22 0549 11/10/22 0352  NA 145   < > 147* 147* 144 145 142  K 4.7   < > 4.9 4.5 4.0 4.4 4.1  CL 115*  --  115*  --  115* 109 110  CO2 25  --   --   --  22 27 29   GLUCOSE 120*  --  114*  --  96 91 138*  BUN 28*  --  28*  --  25* 28* 24*  CREATININE 1.18  --  1.30*  --  1.13 1.14 1.14  CALCIUM 8.5*  --   --   --  7.8* 8.4* 8.0*  MG 2.3  --   --   --  2.1 2.2  --   PHOS  --   --   --   --  3.4 2.6  --    < > = values in this interval not displayed.    GFR: Estimated Creatinine  Clearance: 50.8 mL/min (by C-G formula based on SCr of 1.14 mg/dL).  Liver Function Tests: Recent Labs  Lab 11/07/22 1407  AST 19  ALT 22  ALKPHOS 61  BILITOT 0.6  PROT 5.9*  ALBUMIN 2.6*    CBG: Recent Labs  Lab 11/09/22 0906 11/09/22 1157 11/09/22 1727 11/10/22 0840 11/10/22 1136  GLUCAP 95 107* 150* 226* 124*     Recent Results (  from the past 240 hour(s))  Resp panel by RT-PCR (RSV, Flu A&B, Covid) Anterior Nasal Swab     Status: Abnormal   Collection Time: 11/07/22  2:02 PM   Specimen: Anterior Nasal Swab  Result Value Ref Range Status   SARS Coronavirus 2 by RT PCR POSITIVE (A) NEGATIVE Final   Influenza A by PCR NEGATIVE NEGATIVE Final   Influenza B by PCR NEGATIVE NEGATIVE Final    Comment: (NOTE) The Xpert Xpress SARS-CoV-2/FLU/RSV plus assay is intended as an aid in the diagnosis of influenza from Nasopharyngeal swab specimens and should not be used as a sole basis for treatment. Nasal washings and aspirates are unacceptable for Xpert Xpress SARS-CoV-2/FLU/RSV testing.  Fact Sheet for Patients: BloggerCourse.com  Fact Sheet for Healthcare Providers: SeriousBroker.it  This test is not yet approved or cleared by the [place] FDA and has been authorized for detection and/or diagnosis of SARS-CoV-2 by FDA under an Emergency Use Authorization (EUA). This EUA will remain in effect (meaning this test can be used) for the duration of the COVID-19 declaration under Section 564(b)(1) of the Act, 21 U.S.C. section 360bbb-3(b)(1), unless the authorization is terminated or revoked.     Resp Syncytial Virus by PCR NEGATIVE NEGATIVE Final    Comment: (NOTE) Fact Sheet for Patients: BloggerCourse.com  Fact Sheet for Healthcare Providers: SeriousBroker.it  This test is not yet approved or cleared by the [place] FDA and has been authorized for detection  and/or diagnosis of SARS-CoV-2 by FDA under an Emergency Use Authorization (EUA). This EUA will remain in effect (meaning this test can be used) for the duration of the COVID-19 declaration under Section 564(b)(1) of the Act, 21 U.S.C. section 360bbb-3(b)(1), unless the authorization is terminated or revoked.  Performed at [facility], [address] [place], [place] [address]   Blood culture (routine x 2)     Status: None (Preliminary result)   Collection Time: 11/07/22  3:06 PM   Specimen: BLOOD  Result Value Ref Range Status   Specimen Description BLOOD RIGHT ANTECUBITAL  Final   Special Requests   Final    BOTTLES DRAWN AEROBIC AND ANAEROBIC Blood Culture results may not be optimal due to an excessive volume of blood received in culture bottles   Culture   Final    NO GROWTH 3 DAYS Performed at [facility], [address] [place], [place] [address]    Report Status PENDING  Incomplete  Blood culture (routine x 2)     Status: None (Preliminary result)   Collection Time: 11/07/22  3:07 PM   Specimen: BLOOD  Result Value Ref Range Status   Specimen Description BLOOD LEFT ANTECUBITAL  Final   Special Requests   Final    BOTTLES DRAWN AEROBIC AND ANAEROBIC Blood Culture results may not be optimal due to an excessive volume of blood received in culture bottles   Culture   Final    NO GROWTH 3 DAYS Performed at [facility], [address], [place], [place] [address]    Report Status PENDING  Incomplete  MRSA Next Gen by PCR, Nasal     Status: None   Collection Time: 11/07/22  8:15 PM   Specimen: Nasal Mucosa; Nasal Swab  Result Value Ref Range Status   MRSA by PCR Next Gen NOT DETECTED NOT DETECTED Final    Comment: (NOTE) The GeneXpert MRSA Assay (FDA approved for NASAL specimens only), is one component of a comprehensive MRSA colonization surveillance  program. It is not intended to diagnose MRSA infection nor to guide or monitor treatment for MRSA  infections. Test performance is not FDA approved in patients less than 43 years old. Performed at [facility], [address] [place], [place] [address]          Radiology Studies: No results found.      Scheduled Meds:  albuterol  10 mg Nebulization Once   amiodarone  200 mg Oral BID   aspirin EC  81 mg Oral Q0600   Chlorhexidine Gluconate Cloth  6 each Topical Daily   insulin aspart  0-9 Units Subcutaneous Q4H   ipratropium-albuterol  3 mL Nebulization Q6H   levothyroxine  150 mcg Oral Q0600   methylPREDNISolone (SOLU-MEDROL) injection  80 mg Intravenous Q8H   mouth rinse  15 mL Mouth Rinse 4 times per day   Warfarin - Pharmacist Dosing Inpatient   Does not apply q1600   Continuous Infusions:  ceFEPime (MAXIPIME) IV 2 g (11/10/22 0923)     LOS: 3 days        Huey Bienenstock, MD [facility]   To contact the attending provider between 7A-7P or the covering provider during after hours 7P-7A, please log into the web site [email or web address] and access using universal [place] password for that web site. If you do not have the password, please call the hospital operator.  11/10/2022, 1:44 PM

## 2022-11-11 ENCOUNTER — Encounter: Payer: Medicare Other | Admitting: Internal Medicine

## 2022-11-11 ENCOUNTER — Other Ambulatory Visit: Payer: Self-pay | Admitting: Family Medicine

## 2022-11-11 LAB — CBC
HCT: 35.5 % — ABNORMAL LOW (ref 39.0–52.0)
Hemoglobin: 10.1 g/dL — ABNORMAL LOW (ref 13.0–17.0)
MCH: 26.9 pg (ref 26.0–34.0)
MCHC: 28.5 g/dL — ABNORMAL LOW (ref 30.0–36.0)
MCV: 94.4 fL (ref 80.0–100.0)
Platelets: 200 10*3/uL (ref 150–400)
RBC: 3.76 MIL/uL — ABNORMAL LOW (ref 4.22–5.81)
RDW: 21.3 % — ABNORMAL HIGH (ref 11.5–15.5)
WBC: 7.9 10*3/uL (ref 4.0–10.5)
nRBC: 0 % (ref 0.0–0.2)

## 2022-11-11 LAB — GLUCOSE, CAPILLARY
Glucose-Capillary: 110 mg/dL — ABNORMAL HIGH (ref 70–99)
Glucose-Capillary: 119 mg/dL — ABNORMAL HIGH (ref 70–99)
Glucose-Capillary: 120 mg/dL — ABNORMAL HIGH (ref 70–99)
Glucose-Capillary: 120 mg/dL — ABNORMAL HIGH (ref 70–99)
Glucose-Capillary: 145 mg/dL — ABNORMAL HIGH (ref 70–99)
Glucose-Capillary: 147 mg/dL — ABNORMAL HIGH (ref 70–99)

## 2022-11-11 LAB — BASIC METABOLIC PANEL
Anion gap: 7 (ref 5–15)
BUN: 26 mg/dL — ABNORMAL HIGH (ref 8–23)
CO2: 29 mmol/L (ref 22–32)
Calcium: 8.4 mg/dL — ABNORMAL LOW (ref 8.9–10.3)
Chloride: 108 mmol/L (ref 98–111)
Creatinine, Ser: 1.03 mg/dL (ref 0.61–1.24)
GFR, Estimated: >60 mL/min (ref >60)
Glucose, Bld: 118 mg/dL — ABNORMAL HIGH (ref 70–99)
Potassium: 4.6 mmol/L (ref 3.5–5.1)
Sodium: 144 mmol/L (ref 135–145)

## 2022-11-11 LAB — PROTIME-INR
INR: 2.2 — ABNORMAL HIGH (ref 0.8–1.2)
Prothrombin Time: 24.4 seconds — ABNORMAL HIGH (ref 11.4–15.2)

## 2022-11-11 LAB — CULTURE, BLOOD (ROUTINE X 2): Culture: NO GROWTH

## 2022-11-11 LAB — BRAIN NATRIURETIC PEPTIDE: B Natriuretic Peptide: 858.9 pg/mL — ABNORMAL HIGH (ref 0.0–100.0)

## 2022-11-11 MED ORDER — WARFARIN SODIUM 2 MG PO TABS
2.0000 mg | ORAL_TABLET | Freq: Once | ORAL | Status: AC
  Administered 2022-11-11: 2 mg via ORAL
  Filled 2022-11-11: qty 1

## 2022-11-11 NOTE — Plan of Care (Signed)
  Problem: Education: Goal: Understanding of CV disease, CV risk reduction, and recovery process will improve Outcome: Progressing   Problem: Activity: Goal: Ability to return to baseline activity level will improve Outcome: Progressing   Problem: Activity: Goal: Ability to tolerate increased activity will improve Outcome: Progressing   Problem: Education: Goal: Knowledge of General Education information will improve Description: Including pain rating scale, medication(s)/side effects and non-pharmacologic comfort measures Outcome: Progressing   Problem: Nutrition: Goal: Adequate nutrition will be maintained Outcome: Progressing   Problem: Pain Managment: Goal: General experience of comfort will improve Outcome: Progressing   Problem: Safety: Goal: Ability to remain free from injury will improve Outcome: Progressing   Problem: Skin Integrity: Goal: Risk for impaired skin integrity will decrease Outcome: Progressing

## 2022-11-11 NOTE — Progress Notes (Signed)
PROGRESS NOTE    [name]  GNF:[id number] DOB: [birth year] DOA: 11/07/2022 PCP: [name], MD    Chief Complaint  Patient presents with   Shortness of Breath    Brief Narrative:   82 year old man who presented to [facility] ED 7/15 for SOB. PMHx significant for HTN, HLD, CAD (s/p CABG), PAF with history of DVT (s/p IVC filter, on Coumadin), HFrEF (Echo 6/28 with EF 55-60%), COPD, AAA, bilateral RAS, CKD stage IIIb, subclavian artery stenosis and severe PAD (s/p R iliofemoral endarterectomy, popliteal endarterectomy and R TMA with VVS 6/25, 7/2). Recently admitted to [facility] 6/18 - 7/8 for sepsis due to RLE infection (s/p surgical intervention), discharged to rehab [facility]). EMS called for SOB/respiratory distress; per report SpO2 73% on RA, improved to 100% with NRB mask. Per facility records, patient had been on ceftriaxone/clindamycin x 5 days for PNA treatment.    - On ED arrival, patient temp was 99.24F with HR 80, RR 23, BP 117/93, SpO2 100% on NRB, transitioned to BiPAP. Labs were notable for WBC 11.9, Hgb 9.7, Plt 305. INR 3.6 (on Coumadin). Na 145, K 4.7, CO2 25, Cr 1.18 (baseline); LFTs WNL. BNP 997 (585), trop 76 > 89, suspect demand-related. COVID positive. CXR demonstrated worsening RML/RLL PNA with R pleural effusion. The patient was placed on IV steroids. Broad-spectrum antibiotic coverage also initiated.  Patient was admitted to [facility] continue to continuous BiPAP requirement, he transferred to [facility] service 7/17.  The patient continues to slowly improve. He is currently requiring BIPAP at 40 % FIO2 to maintain SaO2 in the high 90's.  Status is: Inpatient   Consultants:  PCCM  Subjective:  The patient is resting comfortably. No new complaints. He is awake, alert, and in no acute distress.  Objective: Vitals:   11/11/22 0700 11/11/22 0800 11/11/22 0855 11/11/22 0928  BP:  (!) 142/51    Pulse:  (!) 56 63 (!) 59  Resp:  19 (!) 25 19  Temp:    98.1 F (36.7 C)   TempSrc:    Oral  SpO2:  98%  98%  Weight: 67.8 kg     Height:        Intake/Output Summary (Last 24 hours) at 11/11/2022 1731 Last data filed at 11/11/2022 0530 Gross per 24 hour  Intake --  Output 2650 ml  Net -2650 ml   Filed Weights   11/10/22 0500 11/11/22 0500 11/11/22 0700  Weight: 73.6 kg 71.3 kg 67.8 kg    Examination:  Awake Alert, Oriented X 3, frail, deconditioned Symmetrical Chest wall movement, Neck, diminished air entry at the bases RRR,No Gallops,Rubs or new Murmurs, No Parasternal Heave +ve B.Sounds, Abd Soft, No tenderness, No rebound - guarding or rigidity. No Cyanosis, Clubbing or edema, right lower extremity status post TMA staples present, recent bypass surgery site looks clean, please see pictures below  Pictures on 7/17         Data Reviewed: I have personally reviewed following labs and imaging studies  CBC: Recent Labs  Lab 11/07/22 1407 11/07/22 1414 11/07/22 1730 11/08/22 0629 11/09/22 0549 11/10/22 0352 11/11/22 0815  WBC 11.9*  --   --  6.6 7.9 5.7 7.9  NEUTROABS 10.8*  --   --   --   --   --   --   HGB 9.7*   < > 9.2* 8.3* 8.7* 9.3* 10.1*  HCT 35.9*   < > 27.0* 29.7* 30.6* 33.3* 35.5*  MCV 101.4*  --   --  98.3 98.7 96.2 94.4  PLT 305  --   --  207 200 195 200   < > = values in this interval not displayed.    Basic Metabolic Panel: Recent Labs  Lab 11/07/22 1407 11/07/22 1414 11/07/22 1415 11/07/22 1730 11/08/22 0629 11/09/22 0549 11/10/22 0352 11/11/22 0815  NA 145   < > 147* 147* 144 145 142 144  K 4.7   < > 4.9 4.5 4.0 4.4 4.1 4.6  CL 115*  --  115*  --  115* 109 110 108  CO2 25  --   --   --  22 27 29 29   GLUCOSE 120*  --  114*  --  96 91 138* 118*  BUN 28*  --  28*  --  25* 28* 24* 26*  CREATININE 1.18  --  1.30*  --  1.13 1.14 1.14 1.03  CALCIUM 8.5*  --   --   --  7.8* 8.4* 8.0* 8.4*  MG 2.3  --   --   --  2.1 2.2  --   --   PHOS  --   --   --   --  3.4 2.6  --   --    < > = values in this interval not  displayed.    GFR: Estimated Creatinine Clearance: 53.9 mL/min (by C-G formula based on SCr of 1.03 mg/dL).  Liver Function Tests: Recent Labs  Lab 11/07/22 1407  AST 19  ALT 22  ALKPHOS 61  BILITOT 0.6  PROT 5.9*  ALBUMIN 2.6*    CBG: Recent Labs  Lab 11/10/22 2308 11/11/22 0418 11/11/22 0909 11/11/22 1227 11/11/22 1557  GLUCAP 123* 110* 119* 120* 120*     Recent Results (from the past 240 hour(s))  Resp panel by RT-PCR (RSV, Flu A&B, Covid) Anterior Nasal Swab     Status: Abnormal   Collection Time: 11/07/22  2:02 PM   Specimen: Anterior Nasal Swab  Result Value Ref Range Status   SARS Coronavirus 2 by RT PCR POSITIVE (A) NEGATIVE Final   Influenza A by PCR NEGATIVE NEGATIVE Final   Influenza B by PCR NEGATIVE NEGATIVE Final    Comment: (NOTE) The Xpert Xpress SARS-CoV-2/FLU/RSV plus assay is intended as an aid in the diagnosis of influenza from Nasopharyngeal swab specimens and should not be used as a sole basis for treatment. Nasal washings and aspirates are unacceptable for Xpert Xpress SARS-CoV-2/FLU/RSV testing.  Fact Sheet for Patients: BloggerCourse.com  Fact Sheet for Healthcare Providers: SeriousBroker.it  This test is not yet approved or cleared by the [place] FDA and has been authorized for detection and/or diagnosis of SARS-CoV-2 by FDA under an Emergency Use Authorization (EUA). This EUA will remain in effect (meaning this test can be used) for the duration of the COVID-19 declaration under Section 564(b)(1) of the Act, 21 U.S.C. section 360bbb-3(b)(1), unless the authorization is terminated or revoked.     Resp Syncytial Virus by PCR NEGATIVE NEGATIVE Final    Comment: (NOTE) Fact Sheet for Patients: BloggerCourse.com  Fact Sheet for Healthcare Providers: SeriousBroker.it  This test is not yet approved or cleared by the [place] FDA and has been authorized for detection and/or diagnosis of SARS-CoV-2 by FDA under an Emergency Use Authorization (EUA). This EUA will remain in effect (meaning this test can be used) for the duration of the COVID-19 declaration under Section 564(b)(1) of the Act, 21 U.S.C. section 360bbb-3(b)(1), unless the authorization is terminated or revoked.  Performed  at [facility], [address] [place], [place] [address]   Blood culture (routine x 2)     Status: None (Preliminary result)   Collection Time: 11/07/22  3:06 PM   Specimen: BLOOD  Result Value Ref Range Status   Specimen Description BLOOD RIGHT ANTECUBITAL  Final   Special Requests   Final    BOTTLES DRAWN AEROBIC AND ANAEROBIC Blood Culture results may not be optimal due to an excessive volume of blood received in culture bottles   Culture   Final    NO GROWTH 4 DAYS Performed at [facility], [address] [place], [place] [address]    Report Status PENDING  Incomplete  Blood culture (routine x 2)     Status: None (Preliminary result)   Collection Time: 11/07/22  3:07 PM   Specimen: BLOOD  Result Value Ref Range Status   Specimen Description BLOOD LEFT ANTECUBITAL  Final   Special Requests   Final    BOTTLES DRAWN AEROBIC AND ANAEROBIC Blood Culture results may not be optimal due to an excessive volume of blood received in culture bottles   Culture   Final    NO GROWTH 4 DAYS Performed at [facility], [address], [place], [place] [address]    Report Status PENDING  Incomplete  MRSA Next Gen by PCR, Nasal     Status: None   Collection Time: 11/07/22  8:15 PM   Specimen: Nasal Mucosa; Nasal Swab  Result Value Ref Range Status   MRSA by PCR Next Gen NOT DETECTED NOT DETECTED Final    Comment: (NOTE) The GeneXpert MRSA Assay (FDA approved for NASAL specimens only), is one component of a comprehensive MRSA colonization surveillance program. It is not intended to diagnose MRSA  infection nor to guide or monitor treatment for MRSA infections. Test performance is not FDA approved in patients less than 39 years old. Performed at [facility], [address] [place], [place] [address]     Scheduled Meds:  albuterol  10 mg Nebulization Once   amiodarone  200 mg Oral BID   aspirin EC  81 mg Oral Q0600   Chlorhexidine Gluconate Cloth  6 each Topical Daily   insulin aspart  0-9 Units Subcutaneous Q4H   ipratropium-albuterol  3 mL Nebulization Q6H   levothyroxine  150 mcg Oral Q0600   methylPREDNISolone (SOLU-MEDROL) injection  80 mg Intravenous Q8H   mouth rinse  15 mL Mouth Rinse 4 times per day   Warfarin - Pharmacist Dosing Inpatient   Does not apply q1600   Continuous Infusions:  ceFEPime (MAXIPIME) IV 2 g (11/11/22 0914)   Assessment & Plan:   Principal Problem:   Multifocal pneumonia  Acute respiratory failure Multifocal PNA, HCAP vs. COVID-associated COVID-19 infection COPD with acute exacerbation in the setting of PNA -Admitted to ICU due to continuous BiPAP, currently on intermittent BiPAP daytime and BiPAP continue with nighttime, still requiring significant amount of BiPAP daily, as he reports significant dyspnea, he is tachypneic when he is off BiPAP, so trying to Break during mealtimes. - Scheduled nebs - IV steroids, changed hydrocortisone to Solu-Medrol. - Pulmonary hygiene > IS/FV - ABX cefepime continue for HAP coverage. DC vanco with negative MRSA PCR.  - F/u Cx data   Acute on chronic HFrEF; has had recovery of ejection fraction to 55 to 60% on most recent Echo Severe aortic valve stenosis - Cardiac monitoring -Difficultly elevated BNP, trending up, will give  40 mg of IV Lasix today, will dose daily. - Outpatient Cardiology f/u for AS, needs to recover from current hospitalization prior to consideration of intervention   Paroxysmal atrial fibrillation anticoagulated with Coumadin PTA. Failed DCCV January 2024. History of  DVT Paroxysmal Afib - Continue amiodarone PO - Warfarin per pharmacy - Cardiac monitoring - Optimize electrolytes for K > 4, Mg > 2   Recent critical limb ischemia of right lower extremity with gangrene Extensive peripheral arterial disease - S/p R iliofem EA/popliteal EA 6/25, R TMA 7/2 - Outpatient management per VVS - ASA/statin - Continue Coumadin - PT/OT -Wound looks clean.  The pictures in the media section.   Abdominal aortic aneurysm- 5.5 cm on recent imaging, enlarged per prior image - Continue ASA/statin - Outpatient monitoring/mgmt per VVS   CKD stage IIIa, stable - Trend BMP - Replete electrolytes as indicated - Monitor I&Os   Hypothyroidism - Continue levothyroxine   Anemia - Trend H&H - Monitor for signs of active bleeding - Transfuse for Hgb < 7.0 or hemodynamically significant bleeding    DVT prophylaxis: On warfarin Code Status: full Family Communication: D/W daughter at bedside 7/17 Disposition:   LOS: 4 days        Cassy Sprowl Gerri Lins, MD [facility]   To contact the attending provider between 7A-7P or the covering provider during after hours 7P-7A, please log into the web site [email or web address] and access using universal [place] password for that web site. If you do not have the password, please call the hospital operator.  11/11/2022, 5:31 PM

## 2022-11-11 NOTE — Progress Notes (Signed)
ANTICOAGULATION CONSULT NOTE - follow up  Pharmacy Consult for warfarin Indication: history DVT  Allergies  Allergen Reactions   Plavix [Clopidogrel Bisulfate]     Brain hemorrhage prev while on plavix and aspirin    Patient Measurements: Height: 5\' 9"  (175.3 cm) Weight: 67.8 kg (149 lb 7.6 oz) IBW/kg (Calculated) : 70.7   Vital Signs: Temp: 98.1 F (36.7 C) (07/19 0928) Temp Source: Oral (07/19 0928) BP: 142/51 (07/19 0800) Pulse Rate: 59 (07/19 0928)  Labs: Recent Labs    11/09/22 0549 11/10/22 0352 11/11/22 0815  HGB 8.7* 9.3* 10.1*  HCT 30.6* 33.3* 35.5*  PLT 200 195 200  LABPROT 29.7* 24.2* 24.4*  INR 2.8* 2.1* 2.2*  CREATININE 1.14 1.14 1.03    Estimated Creatinine Clearance: 53.9 mL/min (by C-G formula based on SCr of 1.03 mg/dL).   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 06/2011 Echo: EF 35-40%; b. 06/2012 Echo: EF 50%; c. 12/2016 Echo: EF 55-60%, no rwma, GRI DD, mild AS/MR, nl RV fxn, nl RVSP; d. 02/2021 Echo: EF 55%, GrII DD. Mild MR. Mild to mod TR. Sev AS by VTI.   AAA (abdominal aortic aneurysm) (HCC)    a. 05/2020 Abd u/s: 3.6cm.   Aortic calcification (HCC) 05/01/2013   Bilateral renal artery stenosis (HCC)    a. 06/2013 Angio/PTA: LRA: 95 (5x18 Herculink stent), RRA 60ost; b. 04/2021 Angio: mod RRA stenosis w/ patent LRA stent.   Carotid arterial disease (HCC)    a. 05/2020 Carotid U/S: RICA 1-39%, LICA 40-59%   CHF (congestive heart failure) (HCC)    Chronic kidney disease    COPD (chronic obstructive pulmonary disease) (HCC)    Coronary artery disease    a. 2013 s/p CABG x 3 (LIMA->LAD, VG->OM, VG->RPDA; b. 06/2012 MV: no ischemia; c. 02/2021 Cath: LM 100, RCA 90ost, 146m, free LIMA->LAD nl, VG->OM2 nl, VG->RPDA nl. Mod AS (mean grad , AVA 1.1cm^2). RHC w/ elev filling pressures.   History of prior cigarette smoking 04/11/2008   Qualifier: Diagnosis of  By:  Ermalene Searing MD, Amy     Hyperlipidemia    Hypertension    Hypothyroidism    Intraventricular hemorrhage (HCC) 07/12/2012   Ischemic cardiomyopathy    a. 06/2011 Echo: EF 35-40%; b. 06/2012 Echo: EF 50%; c. 12/2016 Echo: EF 55-60%, no rwma, GRI DD; d. 02/2021 Echo: Ef 55%, GrII DD.   Moderate aortic stenosis    a. 02/2021 Echo: EF 55%, GrII DD. Sev Ca2+ of AoV. Sev AS w/ AVA by VTI 0.79cm^2; b. 02/2021 Cath: Mod AS w/ mean grad . AVA 1.1cm^2.   PAF (paroxysmal atrial fibrillation) (HCC)    Peripheral arterial disease (HCC)    a. Previous left lower extremity stenting by Dr. Evette Cristal;  b. 12/2012 s/p bilat ostial common iliac stenting; c. 04/2021 Angio: Patent LRA stent, mod RRA stenosis. Small AAA. Sev plaque RSFA 2/ subtl occl of inf branch of profunda. Diff Ca2+ SFA dzs w/ collats from profunda and 3V runoff. Diffuse LSFA dzs w/ collats from profunda. Subtl PT dzs. 3V runoff-->Med Rx.   Right leg DVT (HCC) 08/13/2012   Subclavian artery stenosis, left (HCC) 05/2012   Status post stenting of the ostium and self-expanding stent placement to the left axillary artery   Venous insufficiency       Assessment: 82 yo male with COPD exacerbation, PNA and COVID positive. He is on warfarin PTA for history of DVT (  IVC in place). Pharmacy consulted to dose warfarin. He was also on full dose enoxaparin PTA (last dose given 11/07/22)  Today INR is 2.2 therapeutic. Warfarin dose held x 2 days  on 7/15 and7/16.  Amiodarone continues from prior to admission.  On ASA 81mg  daily  Hgb 9s stable,  pltc wnl stable. No bleeding reported.   Home warfarin dose: 2.5mg /day except take none on Saturday, last dose taken PTA 7/14.   Goal of Therapy:  INR 2-3 Monitor platelets by anticoagulation protocol: Yes   Plan:  Warfarin 2mg  x1 today Daily PT/INR  Ulyses Southward, PharmD, BCIDP, AAHIVP, CPP Infectious Disease Pharmacist 11/11/2022 11:51 AM

## 2022-11-11 NOTE — Progress Notes (Signed)
PT Cancellation Note  Patient Details Name: [name] MRN: [id number] DOB: [birth year]   Cancelled Treatment:    Reason Eval/Treat Not Completed: Patient not medically ready (pt remains on BiPAP 45L/min 40% FiO2. Will follow up at later date/time when medically ready.)   Wynn Maudlin, DPT Acute Rehabilitation Services Office [phone]  11/11/22 12:04 PM

## 2022-11-12 LAB — CBC
HCT: 31.7 % — ABNORMAL LOW (ref 39.0–52.0)
Hemoglobin: 9.1 g/dL — ABNORMAL LOW (ref 13.0–17.0)
MCH: 26.9 pg (ref 26.0–34.0)
MCHC: 28.7 g/dL — ABNORMAL LOW (ref 30.0–36.0)
MCV: 93.8 fL (ref 80.0–100.0)
Platelets: 191 10*3/uL (ref 150–400)
RBC: 3.38 MIL/uL — ABNORMAL LOW (ref 4.22–5.81)
RDW: 21.2 % — ABNORMAL HIGH (ref 11.5–15.5)
WBC: 8.6 10*3/uL (ref 4.0–10.5)
nRBC: 0 % (ref 0.0–0.2)

## 2022-11-12 LAB — BASIC METABOLIC PANEL
Anion gap: 5 (ref 5–15)
BUN: 27 mg/dL — ABNORMAL HIGH (ref 8–23)
CO2: 29 mmol/L (ref 22–32)
Calcium: 8.2 mg/dL — ABNORMAL LOW (ref 8.9–10.3)
Chloride: 109 mmol/L (ref 98–111)
Creatinine, Ser: 0.99 mg/dL (ref 0.61–1.24)
GFR, Estimated: >60 mL/min (ref >60)
Glucose, Bld: 127 mg/dL — ABNORMAL HIGH (ref 70–99)
Potassium: 3.5 mmol/L (ref 3.5–5.1)
Sodium: 143 mmol/L (ref 135–145)

## 2022-11-12 LAB — CULTURE, BLOOD (ROUTINE X 2): Culture: NO GROWTH

## 2022-11-12 LAB — GLUCOSE, CAPILLARY
Glucose-Capillary: 120 mg/dL — ABNORMAL HIGH (ref 70–99)
Glucose-Capillary: 123 mg/dL — ABNORMAL HIGH (ref 70–99)
Glucose-Capillary: 116 mg/dL — ABNORMAL HIGH (ref 70–99)
Glucose-Capillary: 144 mg/dL — ABNORMAL HIGH (ref 70–99)
Glucose-Capillary: 181 mg/dL — ABNORMAL HIGH (ref 70–99)
Glucose-Capillary: 132 mg/dL — ABNORMAL HIGH (ref 70–99)

## 2022-11-12 LAB — PROTIME-INR
INR: 2.7 — ABNORMAL HIGH (ref 0.8–1.2)
Prothrombin Time: 28.7 seconds — ABNORMAL HIGH (ref 11.4–15.2)

## 2022-11-12 LAB — BRAIN NATRIURETIC PEPTIDE: B Natriuretic Peptide: 986 pg/mL — ABNORMAL HIGH (ref 0.0–100.0)

## 2022-11-12 MED ORDER — HYDROXYZINE HCL 10 MG PO TABS
10.0000 mg | ORAL_TABLET | Freq: Three times a day (TID) | ORAL | Status: DC | PRN
  Administered 2022-11-12 – 2022-11-15 (×4): 10 mg via ORAL
  Filled 2022-11-12 (×5): qty 1

## 2022-11-12 MED ORDER — WARFARIN SODIUM 1 MG PO TABS
1.0000 mg | ORAL_TABLET | Freq: Once | ORAL | Status: AC
  Administered 2022-11-12: 1 mg via ORAL
  Filled 2022-11-12: qty 1

## 2022-11-12 NOTE — Plan of Care (Signed)
  Problem: Education: Goal: Understanding of CV disease, CV risk reduction, and recovery process will improve Outcome: Progressing Goal: Individualized Educational Video(s) Outcome: Progressing   Problem: Activity: Goal: Ability to return to baseline activity level will improve Outcome: Progressing   Problem: Cardiovascular: Goal: Ability to achieve and maintain adequate cardiovascular perfusion will improve Outcome: Progressing Goal: Vascular access site(s) Level 0-1 will be maintained Outcome: Progressing   Problem: Health Behavior/Discharge Planning: Goal: Ability to safely manage health-related needs after discharge will improve Outcome: Progressing   Problem: Education: Goal: Knowledge of prescribed regimen will improve Outcome: Progressing   Problem: Activity: Goal: Ability to tolerate increased activity will improve Outcome: Progressing   Problem: Bowel/Gastric: Goal: Gastrointestinal status for postoperative course will improve Outcome: Progressing   Problem: Clinical Measurements: Goal: Postoperative complications will be avoided or minimized Outcome: Progressing Goal: Signs and symptoms of graft occlusion will improve Outcome: Progressing   Problem: Skin Integrity: Goal: Demonstration of wound healing without infection will improve Outcome: Progressing   Problem: Education: Goal: Knowledge of General Education information will improve Description: Including pain rating scale, medication(s)/side effects and non-pharmacologic comfort measures Outcome: Progressing   Problem: Health Behavior/Discharge Planning: Goal: Ability to manage health-related needs will improve Outcome: Progressing   Problem: Clinical Measurements: Goal: Ability to maintain clinical measurements within normal limits will improve Outcome: Progressing Goal: Will remain free from infection Outcome: Progressing Goal: Diagnostic test results will improve Outcome: Progressing Goal:  Respiratory complications will improve Outcome: Progressing Goal: Cardiovascular complication will be avoided Outcome: Progressing   Problem: Activity: Goal: Risk for activity intolerance will decrease Outcome: Progressing   Problem: Nutrition: Goal: Adequate nutrition will be maintained Outcome: Progressing   Problem: Coping: Goal: Level of anxiety will decrease Outcome: Progressing   Problem: Elimination: Goal: Will not experience complications related to bowel motility Outcome: Progressing Goal: Will not experience complications related to urinary retention Outcome: Progressing   Problem: Pain Managment: Goal: General experience of comfort will improve Outcome: Progressing   Problem: Safety: Goal: Ability to remain free from injury will improve Outcome: Progressing   Problem: Skin Integrity: Goal: Risk for impaired skin integrity will decrease Outcome: Progressing   Problem: Education: Goal: Knowledge of risk factors and measures for prevention of condition will improve Outcome: Progressing   Problem: Coping: Goal: Psychosocial and spiritual needs will be supported Outcome: Progressing   Problem: Respiratory: Goal: Will maintain a patent airway Outcome: Progressing Goal: Complications related to the disease process, condition or treatment will be avoided or minimized Outcome: Progressing

## 2022-11-12 NOTE — Progress Notes (Signed)
PT Cancellation Note  Patient Details Name: [name] MRN: [id number] DOB: 1940/12/01   Cancelled Treatment:    Reason Eval/Treat Not Completed: Patient not medically ready  Nursing just finished trying to get pt to [facility]. He had incr WOB and was unable to stand. RN recommended hold PT eval at this time.    [name], PT Acute Rehabilitation Services  Office [phone]   [name] 11/12/2022, 1:24 PM

## 2022-11-12 NOTE — Progress Notes (Signed)
Remains [email or web address]  for SOB/tachypnea &/or desats   11/12/22 2042  BiPAP/CPAP/SIPAP  BiPAP/CPAP/SIPAP Pt Type Adult  BiPAP/CPAP/SIPAP V60  FiO2 (%) 55 %  BiPAP/CPAP /SiPAP Vitals  SpO2 97 %

## 2022-11-12 NOTE — Progress Notes (Signed)
Patient has struggled all night keeping the bipap on. He is very anxious. I secure chatted Dr. Imogene Burn, the hospitalist on call, and requested medication for anxiety. Dr. Imogene Burn replied and no new orders placed d/t his concern that patient is at a high risk of intubation if sedated. This nurse has continued to encourage patient to wear bipap mask and readjust mask for him throughout the night.

## 2022-11-12 NOTE — Progress Notes (Signed)
PROGRESS NOTE    [name]  ZOX:[id number] DOB: Oct 04, 1940 DOA: 11/07/2022 PCP: [name], MD    Chief Complaint  Patient presents with   Shortness of Breath    Brief Narrative:   82 year old man who presented to [facility] ED 7/15 for SOB. PMHx significant for HTN, HLD, CAD (s/p CABG), PAF with history of DVT (s/p IVC filter, on Coumadin), HFrEF (Echo 6/28 with EF 55-60%), COPD, AAA, bilateral RAS, CKD stage IIIb, subclavian artery stenosis and severe PAD (s/p R iliofemoral endarterectomy, popliteal endarterectomy and R TMA with VVS 6/25, 7/2). Recently admitted to [facility] 6/18 - 7/8 for sepsis due to RLE infection (s/p surgical intervention), discharged to rehab [facility]). EMS called for SOB/respiratory distress; per report SpO2 73% on RA, improved to 100% with NRB mask. Per facility records, patient had been on ceftriaxone/clindamycin x 5 days for PNA treatment.    - On ED arrival, patient temp was 99.38F with HR 80, RR 23, BP 117/93, SpO2 100% on NRB, transitioned to BiPAP. Labs were notable for WBC 11.9, Hgb 9.7, Plt 305. INR 3.6 (on Coumadin). Na 145, K 4.7, CO2 25, Cr 1.18 (baseline); LFTs WNL. BNP 997 (585), trop 76 > 89, suspect demand-related. COVID positive. CXR demonstrated worsening RML/RLL PNA with R pleural effusion. The patient was placed on IV steroids. Broad-spectrum antibiotic coverage also initiated.  Patient was admitted to [facility] continue to continuous BiPAP requirement, he transferred to [facility] service 7/17.  The patient continues to slowly improve. He is currently requiring BIPAP at 55 % FIO2 to maintain SaO2 in the high 90's.  Status is: Inpatient   Consultants:  PCCM  Subjective:  The patient is resting comfortably. No new complaints. He is awake, alert, and in no acute distress.  Objective: Vitals:   11/12/22 1200 11/12/22 1317 11/12/22 1609 11/12/22 1711  BP: 129/75  (!) 126/54 (!) 129/51  Pulse: 68  67 65  Resp: (!) 27  17 (!) 25  Temp: 98.2 F (36.8  C)  98 F (36.7 C) 98.2 F (36.8 C)  TempSrc: Oral  Oral Oral  SpO2: 98% 99% 97% 99%  Weight:      Height:        Intake/Output Summary (Last 24 hours) at 11/12/2022 1820 Last data filed at 11/12/2022 8119 Gross per 24 hour  Intake --  Output 750 ml  Net -750 ml   Filed Weights   11/10/22 0500 11/11/22 0500 11/11/22 0700  Weight: 73.6 kg 71.3 kg 67.8 kg    Exam:  Constitutional:  The patient is awake, alert, and oriented x 3. No acute distress. Respiratory:  No increased work of breathing. No wheezes, rales, or rhonchi No tactile fremitus Cardiovascular:  Regular rate and rhythm No murmurs, ectopy, or gallups. No lateral PMI. No thrills. Abdomen:  Abdomen is soft, non-tender, non-distended No hernias, masses, or organomegaly Normoactive bowel sounds.  Musculoskeletal:  No cyanosis, clubbing, or edema Staples in place from recent Rt TMA.  Skin:  No rashes, lesions, ulcers palpation of skin: no induration or nodules Neurologic:  CN 2-12 intact Sensation all 4 extremities intact Psychiatric:  Mental status Mood, affect appropriate Orientation to person, place, time  judgment and insight appear intact   Pictures on 7/17         Data Reviewed: I have personally reviewed following labs and imaging studies  CBC: Recent Labs  Lab 11/07/22 1407 11/07/22 1414 11/08/22 0629 11/09/22 0549 11/10/22 0352 11/11/22 0815 11/12/22 0326  WBC 11.9*  --  6.6 7.9 5.7 7.9 8.6  NEUTROABS 10.8*  --   --   --   --   --   --   HGB 9.7*   < > 8.3* 8.7* 9.3* 10.1* 9.1*  HCT 35.9*   < > 29.7* 30.6* 33.3* 35.5* 31.7*  MCV 101.4*  --  98.3 98.7 96.2 94.4 93.8  PLT 305  --  207 200 195 200 191   < > = values in this interval not displayed.    Basic Metabolic Panel: Recent Labs  Lab 11/07/22 1407 11/07/22 1414 11/08/22 0629 11/09/22 0549 11/10/22 0352 11/11/22 0815 11/12/22 0326  NA 145   < > 144 145 142 144 143  K 4.7   < > 4.0 4.4 4.1 4.6 3.5  CL 115*   <  > 115* 109 110 108 109  CO2 25  --  22 27 29 29 29   GLUCOSE 120*   < > 96 91 138* 118* 127*  BUN 28*   < > 25* 28* 24* 26* 27*  CREATININE 1.18   < > 1.13 1.14 1.14 1.03 0.99  CALCIUM 8.5*  --  7.8* 8.4* 8.0* 8.4* 8.2*  MG 2.3  --  2.1 2.2  --   --   --   PHOS  --   --  3.4 2.6  --   --   --    < > = values in this interval not displayed.    GFR: Estimated Creatinine Clearance: 56.1 mL/min (by C-G formula based on SCr of 0.99 mg/dL).  Liver Function Tests: Recent Labs  Lab 11/07/22 1407  AST 19  ALT 22  ALKPHOS 61  BILITOT 0.6  PROT 5.9*  ALBUMIN 2.6*    CBG: Recent Labs  Lab 11/12/22 0411 11/12/22 0844 11/12/22 1248 11/12/22 1607 11/12/22 1709  GLUCAP 120* 123* 116* 144* 181*     Recent Results (from the past 240 hour(s))  Resp panel by RT-PCR (RSV, Flu A&B, Covid) Anterior Nasal Swab     Status: Abnormal   Collection Time: 11/07/22  2:02 PM   Specimen: Anterior Nasal Swab  Result Value Ref Range Status   SARS Coronavirus 2 by RT PCR POSITIVE (A) NEGATIVE Final   Influenza A by PCR NEGATIVE NEGATIVE Final   Influenza B by PCR NEGATIVE NEGATIVE Final    Comment: (NOTE) The Xpert Xpress SARS-CoV-2/FLU/RSV plus assay is intended as an aid in the diagnosis of influenza from Nasopharyngeal swab specimens and should not be used as a sole basis for treatment. Nasal washings and aspirates are unacceptable for Xpert Xpress SARS-CoV-2/FLU/RSV testing.  Fact Sheet for Patients: BloggerCourse.com  Fact Sheet for Healthcare Providers: SeriousBroker.it  This test is not yet approved or cleared by the [place] FDA and has been authorized for detection and/or diagnosis of SARS-CoV-2 by FDA under an Emergency Use Authorization (EUA). This EUA will remain in effect (meaning this test can be used) for the duration of the COVID-19 declaration under Section 564(b)(1) of the Act, 21 U.S.C. section 360bbb-3(b)(1), unless  the authorization is terminated or revoked.     Resp Syncytial Virus by PCR NEGATIVE NEGATIVE Final    Comment: (NOTE) Fact Sheet for Patients: BloggerCourse.com  Fact Sheet for Healthcare Providers: SeriousBroker.it  This test is not yet approved or cleared by the [place] FDA and has been authorized for detection and/or diagnosis of SARS-CoV-2 by FDA under an Emergency Use Authorization (EUA). This EUA will remain in effect (meaning this test can be  used) for the duration of the COVID-19 declaration under Section 564(b)(1) of the Act, 21 U.S.C. section 360bbb-3(b)(1), unless the authorization is terminated or revoked.  Performed at [facility], [address], [place], [place] [address]   Blood culture (routine x 2)     Status: None   Collection Time: 11/07/22  3:06 PM   Specimen: BLOOD  Result Value Ref Range Status   Specimen Description BLOOD RIGHT ANTECUBITAL  Final   Special Requests   Final    BOTTLES DRAWN AEROBIC AND ANAEROBIC Blood Culture results may not be optimal due to an excessive volume of blood received in culture bottles   Culture   Final    NO GROWTH 5 DAYS Performed at [facility], [address] [place], [place] [address]    Report Status 11/12/2022 FINAL  Final  Blood culture (routine x 2)     Status: None   Collection Time: 11/07/22  3:07 PM   Specimen: BLOOD  Result Value Ref Range Status   Specimen Description BLOOD LEFT ANTECUBITAL  Final   Special Requests   Final    BOTTLES DRAWN AEROBIC AND ANAEROBIC Blood Culture results may not be optimal due to an excessive volume of blood received in culture bottles   Culture   Final    NO GROWTH 5 DAYS Performed at [facility], [address], [place], [place] [address]    Report Status 11/12/2022 FINAL  Final  MRSA Next Gen by PCR, Nasal     Status: None   Collection Time: 11/07/22  8:15 PM   Specimen: Nasal Mucosa; Nasal  Swab  Result Value Ref Range Status   MRSA by PCR Next Gen NOT DETECTED NOT DETECTED Final    Comment: (NOTE) The GeneXpert MRSA Assay (FDA approved for NASAL specimens only), is one component of a comprehensive MRSA colonization surveillance program. It is not intended to diagnose MRSA infection nor to guide or monitor treatment for MRSA infections. Test performance is not FDA approved in patients less than 49 years old. Performed at [facility], [address], [place], [place] [address]     Scheduled Meds:  albuterol  10 mg Nebulization Once   amiodarone  200 mg Oral BID   aspirin EC  81 mg Oral Q0600   insulin aspart  0-9 Units Subcutaneous Q4H   ipratropium-albuterol  3 mL Nebulization Q6H   levothyroxine  150 mcg Oral Q0600   methylPREDNISolone (SOLU-MEDROL) injection  80 mg Intravenous Q8H   mouth rinse  15 mL Mouth Rinse 4 times per day   Warfarin - Pharmacist Dosing Inpatient   Does not apply q1600   Continuous Infusions:  ceFEPime (MAXIPIME) IV 2 g (11/12/22 1005)   Assessment & Plan:   Principal Problem:   Multifocal pneumonia  Acute respiratory failure Multifocal PNA, HCAP vs. COVID-associated COVID-19 infection COPD with acute exacerbation in the setting of PNA -Admitted to ICU due to continuous BiPAP, currently on intermittent BiPAP daytime and BiPAP continue with nighttime, still requiring significant amount of BiPAP daily, as he reports significant dyspnea, he is tachypneic when he is off BiPAP, so trying to Break during mealtimes. - Scheduled nebs - IV steroids, changed hydrocortisone to Solu-Medrol. - Pulmonary hygiene > IS/FV - ABX cefepime continue for HAP coverage. DC vanco with negative MRSA PCR.  - F/u Cx data   Acute on chronic HFrEF; has had recovery of ejection fraction to 55 to 60% on most recent  Echo Severe aortic valve stenosis - Cardiac monitoring -Difficultly elevated BNP, trending up, will give 40 mg of IV Lasix today, will dose  daily. - Outpatient Cardiology f/u for AS, needs to recover from current hospitalization prior to consideration of intervention   Paroxysmal atrial fibrillation anticoagulated with Coumadin PTA. Failed DCCV January 2024. History of DVT Paroxysmal Afib - Continue amiodarone PO - Warfarin per pharmacy - Cardiac monitoring - Optimize electrolytes for K > 4, Mg > 2   Recent critical limb ischemia of right lower extremity with gangrene Extensive peripheral arterial disease - S/p R iliofem EA/popliteal EA 6/25, R TMA 7/2 - Outpatient management per VVS - ASA/statin - Continue Coumadin - PT/OT -Wound looks clean.  The pictures in the media section. -Staples to be removed 4 weeks from 7/2 (7/30).   Abdominal aortic aneurysm- 5.5 cm on recent imaging, enlarged per prior image - Continue ASA/statin - Outpatient monitoring/mgmt per VVS   CKD stage IIIa, stable - Trend BMP - Replete electrolytes as indicated - Monitor I&Os   Hypothyroidism - Continue levothyroxine   Anemia - Trend H&H - Monitor for signs of active bleeding - Transfuse for Hgb < 7.0 or hemodynamically significant bleeding    DVT prophylaxis: On warfarin Code Status: full Family Communication: D/W daughter at bedside 7/17 Disposition:   LOS: 5 days   Maddalynn Barnard Gerri Lins, MD [facility]   To contact the attending provider between 7A-7P or the covering provider during after hours 7P-7A, please log into the web site [email or web address] and access using universal [place] password for that web site. If you do not have the password, please call the hospital operator.  11/12/2022, 6:20 PM

## 2022-11-12 NOTE — Plan of Care (Signed)
Problem: Education: Goal: Understanding of CV disease, CV risk reduction, and recovery process will improve 11/12/2022 1838 by Galen Manila, RN Outcome: Progressing 11/12/2022 1838 by Galen Manila, RN Outcome: Not Progressing 11/12/2022 1821 by Galen Manila, RN Outcome: Progressing Goal: Individualized Educational Video(s) 11/12/2022 1838 by Galen Manila, RN Outcome: Progressing 11/12/2022 1838 by Galen Manila, RN Outcome: Not Progressing 11/12/2022 1821 by Galen Manila, RN Outcome: Progressing   Problem: Activity: Goal: Ability to return to baseline activity level will improve 11/12/2022 1838 by Galen Manila, RN Outcome: Progressing 11/12/2022 1838 by Galen Manila, RN Outcome: Not Progressing 11/12/2022 1821 by Galen Manila, RN Outcome: Progressing   Problem: Cardiovascular: Goal: Ability to achieve and maintain adequate cardiovascular perfusion will improve 11/12/2022 1838 by Galen Manila, RN Outcome: Progressing 11/12/2022 1838 by Galen Manila, RN Outcome: Not Progressing 11/12/2022 1821 by Galen Manila, RN Outcome: Progressing Goal: Vascular access site(s) Level 0-1 will be maintained 11/12/2022 1838 by Galen Manila, RN Outcome: Progressing 11/12/2022 1838 by Galen Manila, RN Outcome: Not Progressing 11/12/2022 1821 by Galen Manila, RN Outcome: Progressing   Problem: Health Behavior/Discharge Planning: Goal: Ability to safely manage health-related needs after discharge will improve 11/12/2022 1838 by Galen Manila, RN Outcome: Progressing 11/12/2022 1838 by Galen Manila, RN Outcome: Not Progressing 11/12/2022 1821 by Galen Manila, RN Outcome: Progressing   Problem: Education: Goal: Knowledge of prescribed regimen will improve 11/12/2022 1838 by Galen Manila, RN Outcome: Progressing 11/12/2022 1838 by  Galen Manila, RN Outcome: Not Progressing 11/12/2022 1821 by Galen Manila, RN Outcome: Progressing   Problem: Activity: Goal: Ability to tolerate increased activity will improve 11/12/2022 1838 by Galen Manila, RN Outcome: Progressing 11/12/2022 1838 by Galen Manila, RN Outcome: Not Progressing 11/12/2022 1821 by Galen Manila, RN Outcome: Progressing   Problem: Bowel/Gastric: Goal: Gastrointestinal status for postoperative course will improve 11/12/2022 1838 by Galen Manila, RN Outcome: Progressing 11/12/2022 1838 by Galen Manila, RN Outcome: Not Progressing 11/12/2022 1821 by Galen Manila, RN Outcome: Progressing   Problem: Clinical Measurements: Goal: Postoperative complications will be avoided or minimized 11/12/2022 1838 by Galen Manila, RN Outcome: Progressing 11/12/2022 1838 by Galen Manila, RN Outcome: Not Progressing 11/12/2022 1821 by Galen Manila, RN Outcome: Progressing Goal: Signs and symptoms of graft occlusion will improve 11/12/2022 1838 by Galen Manila, RN Outcome: Progressing 11/12/2022 1838 by Galen Manila, RN Outcome: Not Progressing 11/12/2022 1821 by Galen Manila, RN Outcome: Progressing   Problem: Skin Integrity: Goal: Demonstration of wound healing without infection will improve 11/12/2022 1838 by Galen Manila, RN Outcome: Progressing 11/12/2022 1838 by Galen Manila, RN Outcome: Not Progressing 11/12/2022 1821 by Galen Manila, RN Outcome: Progressing   Problem: Education: Goal: Knowledge of General Education information will improve Description: Including pain rating scale, medication(s)/side effects and non-pharmacologic comfort measures 11/12/2022 1838 by Galen Manila, RN Outcome: Progressing 11/12/2022 1838 by Galen Manila, RN Outcome: Not Progressing 11/12/2022 1821 by Galen Manila, RN Outcome: Progressing   Problem: Health Behavior/Discharge Planning: Goal: Ability to manage health-related needs will improve 11/12/2022 1838 by Galen Manila, RN Outcome: Progressing 11/12/2022 1838 by Galen Manila, RN Outcome: Not Progressing 11/12/2022 1821 by Galen Manila, RN Outcome: Progressing   Problem: Clinical Measurements: Goal: Ability to maintain clinical measurements within normal limits will improve 11/12/2022 1838 by Galen Manila, RN Outcome: Progressing 11/12/2022 1838 by Galen Manila, RN Outcome: Not Progressing 11/12/2022 1821 by Galen Manila, RN Outcome: Progressing Goal: Will remain free from infection 11/12/2022 1838 by Galen Manila, RN Outcome: Progressing 11/12/2022 1838 by Galen Manila, RN Outcome: Not Progressing 11/12/2022 1821  by Galen Manila, RN Outcome: Progressing Goal: Diagnostic test results will improve 11/12/2022 1838 by Galen Manila, RN Outcome: Progressing 11/12/2022 1838 by Galen Manila, RN Outcome: Not Progressing 11/12/2022 1821 by Galen Manila, RN Outcome: Progressing Goal: Respiratory complications will improve 11/12/2022 1838 by Galen Manila, RN Outcome: Progressing 11/12/2022 1838 by Galen Manila, RN Outcome: Not Progressing 11/12/2022 1821 by Galen Manila, RN Outcome: Progressing Goal: Cardiovascular complication will be avoided 11/12/2022 1838 by Galen Manila, RN Outcome: Progressing 11/12/2022 1838 by Galen Manila, RN Outcome: Not Progressing 11/12/2022 1821 by Galen Manila, RN Outcome: Progressing   Problem: Activity: Goal: Risk for activity intolerance will decrease 11/12/2022 1838 by Galen Manila, RN Outcome: Progressing 11/12/2022 1838 by Galen Manila, RN Outcome: Not Progressing 11/12/2022 1821 by Galen Manila, RN Outcome: Progressing    Problem: Nutrition: Goal: Adequate nutrition will be maintained 11/12/2022 1838 by Galen Manila, RN Outcome: Progressing 11/12/2022 1838 by Galen Manila, RN Outcome: Not Progressing 11/12/2022 1821 by Galen Manila, RN Outcome: Progressing   Problem: Coping: Goal: Level of anxiety will decrease 11/12/2022 1838 by Galen Manila, RN Outcome: Progressing 11/12/2022 1838 by Galen Manila, RN Outcome: Not Progressing 11/12/2022 1821 by Galen Manila, RN Outcome: Progressing   Problem: Elimination: Goal: Will not experience complications related to bowel motility 11/12/2022 1838 by Galen Manila, RN Outcome: Progressing 11/12/2022 1838 by Galen Manila, RN Outcome: Not Progressing 11/12/2022 1821 by Galen Manila, RN Outcome: Progressing Goal: Will not experience complications related to urinary retention 11/12/2022 1838 by Galen Manila, RN Outcome: Progressing 11/12/2022 1838 by Galen Manila, RN Outcome: Not Progressing 11/12/2022 1821 by Galen Manila, RN Outcome: Progressing   Problem: Pain Managment: Goal: General experience of comfort will improve 11/12/2022 1838 by Galen Manila, RN Outcome: Progressing 11/12/2022 1838 by Galen Manila, RN Outcome: Not Progressing 11/12/2022 1821 by Galen Manila, RN Outcome: Progressing   Problem: Safety: Goal: Ability to remain free from injury will improve 11/12/2022 1838 by Galen Manila, RN Outcome: Progressing 11/12/2022 1838 by Galen Manila, RN Outcome: Not Progressing 11/12/2022 1821 by Galen Manila, RN Outcome: Progressing   Problem: Skin Integrity: Goal: Risk for impaired skin integrity will decrease 11/12/2022 1838 by Galen Manila, RN Outcome: Progressing 11/12/2022 1838 by Galen Manila, RN Outcome: Not Progressing 11/12/2022 1821 by Galen Manila, RN Outcome:  Progressing   Problem: Education: Goal: Knowledge of risk factors and measures for prevention of condition will improve 11/12/2022 1838 by Galen Manila, RN Outcome: Progressing 11/12/2022 1838 by Galen Manila, RN Outcome: Not Progressing 11/12/2022 1821 by Galen Manila, RN Outcome: Progressing   Problem: Coping: Goal: Psychosocial and spiritual needs will be supported 11/12/2022 1838 by Galen Manila, RN Outcome: Progressing 11/12/2022 1838 by Galen Manila, RN Outcome: Not Progressing 11/12/2022 1821 by Galen Manila, RN Outcome: Progressing   Problem: Respiratory: Goal: Will maintain a patent airway 11/12/2022 1838 by Galen Manila, RN Outcome: Progressing 11/12/2022 1838 by Galen Manila, RN Outcome: Not Progressing 11/12/2022 1821 by Galen Manila, RN Outcome: Progressing Goal: Complications related to the disease process, condition or treatment will be avoided or minimized 11/12/2022 1838 by Galen Manila, RN Outcome: Progressing 11/12/2022 1838 by Galen Manila, RN Outcome: Not Progressing 11/12/2022 1821 by Galen Manila, RN Outcome: Progressing

## 2022-11-12 NOTE — Progress Notes (Addendum)
ANTICOAGULATION CONSULT NOTE - follow up  Pharmacy Consult for warfarin Indication: history DVT  Allergies  Allergen Reactions   Plavix [Clopidogrel Bisulfate]     Brain hemorrhage prev while on plavix and aspirin    Patient Measurements: Height: 5\' 9"  (175.3 cm) Weight: 67.8 kg (149 lb 7.6 oz) IBW/kg (Calculated) : 70.7   Vital Signs: Temp: 97.8 F (36.6 C) (07/20 0800) BP: 142/97 (07/20 0800) Pulse Rate: 68 (07/20 0800)  Labs: Recent Labs    11/10/22 0352 11/11/22 0815 11/12/22 0326  HGB 9.3* 10.1* 9.1*  HCT 33.3* 35.5* 31.7*  PLT 195 200 191  LABPROT 24.2* 24.4* 28.7*  INR 2.1* 2.2* 2.7*  CREATININE 1.14 1.03 0.99    Estimated Creatinine Clearance: 56.1 mL/min (by C-G formula based on SCr of 0.99 mg/dL).   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 06/2011 Echo: EF 35-40%; b. 06/2012 Echo: EF 50%; c. 12/2016 Echo: EF 55-60%, no rwma, GRI DD, mild AS/MR, nl RV fxn, nl RVSP; d. 02/2021 Echo: EF 55%, GrII DD. Mild MR. Mild to mod TR. Sev AS by VTI.   AAA (abdominal aortic aneurysm) (HCC)    a. 05/2020 Abd u/s: 3.6cm.   Aortic calcification (HCC) 05/01/2013   Bilateral renal artery stenosis (HCC)    a. 06/2013 Angio/PTA: LRA: 95 (5x18 Herculink stent), RRA 60ost; b. 04/2021 Angio: mod RRA stenosis w/ patent LRA stent.   Carotid arterial disease (HCC)    a. 05/2020 Carotid U/S: RICA 1-39%, LICA 40-59%   CHF (congestive heart failure) (HCC)    Chronic kidney disease    COPD (chronic obstructive pulmonary disease) (HCC)    Coronary artery disease    a. 2013 s/p CABG x 3 (LIMA->LAD, VG->OM, VG->RPDA; b. 06/2012 MV: no ischemia; c. 02/2021 Cath: LM 100, RCA 90ost, 129m, free LIMA->LAD nl, VG->OM2 nl, VG->RPDA nl. Mod AS (mean grad , AVA 1.1cm^2). RHC w/ elev filling pressures.   History of prior cigarette smoking 04/11/2008   Qualifier: Diagnosis of  By: Ermalene Searing MD, Amy      Hyperlipidemia    Hypertension    Hypothyroidism    Intraventricular hemorrhage (HCC) 07/12/2012   Ischemic cardiomyopathy    a. 06/2011 Echo: EF 35-40%; b. 06/2012 Echo: EF 50%; c. 12/2016 Echo: EF 55-60%, no rwma, GRI DD; d. 02/2021 Echo: Ef 55%, GrII DD.   Moderate aortic stenosis    a. 02/2021 Echo: EF 55%, GrII DD. Sev Ca2+ of AoV. Sev AS w/ AVA by VTI 0.79cm^2; b. 02/2021 Cath: Mod AS w/ mean grad . AVA 1.1cm^2.   PAF (paroxysmal atrial fibrillation) (HCC)    Peripheral arterial disease (HCC)    a. Previous left lower extremity stenting by Dr. Evette Cristal;  b. 12/2012 s/p bilat ostial common iliac stenting; c. 04/2021 Angio: Patent LRA stent, mod RRA stenosis. Small AAA. Sev plaque RSFA 2/ subtl occl of inf branch of profunda. Diff Ca2+ SFA dzs w/ collats from profunda and 3V runoff. Diffuse LSFA dzs w/ collats from profunda. Subtl PT dzs. 3V runoff-->Med Rx.   Right leg DVT (HCC) 08/13/2012   Subclavian artery stenosis, left (HCC) 05/2012   Status post stenting of the ostium and self-expanding stent placement to the left axillary artery   Venous insufficiency       Assessment: 82 yo male with COPD exacerbation, PNA and COVID positive. He is on warfarin PTA for history of DVT (IVC in place). Pharmacy consulted  to dose warfarin. He was also on full dose enoxaparin PTA (last dose given 11/07/22)  Today INR is 2.7 therapeutic. Warfarin dose held x 2 days  on 7/15 and 7/16.  Amiodarone continues from prior to admission.  On ASA 81mg  daily  Hgb 9s stable,  pltc wnl stable. No bleeding reported.   Home warfarin dose: 2.5mg /day except take none on Saturday, last dose taken PTA 7/14.   Goal of Therapy:  INR 2-3 Monitor platelets by anticoagulation protocol: Yes   Plan:  Warfarin 1 mg x1 today Daily PT/INR  Gwynn Burly PharmD 11/12/2022 12:14 PM

## 2022-11-12 NOTE — Plan of Care (Signed)
  Problem: Education: Goal: Knowledge of General Education information will improve Description: Including pain rating scale, medication(s)/side effects and non-pharmacologic comfort measures Outcome: Progressing   Problem: Health Behavior/Discharge Planning: Goal: Ability to manage health-related needs will improve Outcome: Progressing   Problem: Education: Goal: Knowledge of risk factors and measures for prevention of condition will improve Outcome: Progressing   Problem: Coping: Goal: Psychosocial and spiritual needs will be supported Outcome: Progressing   

## 2022-11-13 ENCOUNTER — Inpatient Hospital Stay (HOSPITAL_COMMUNITY): Payer: Medicare Other

## 2022-11-13 DIAGNOSIS — L8990 Pressure ulcer of unspecified site, unspecified stage: Secondary | ICD-10-CM | POA: Insufficient documentation

## 2022-11-13 DIAGNOSIS — J80 Acute respiratory distress syndrome: Secondary | ICD-10-CM

## 2022-11-13 LAB — CBC
HCT: 35.4 % — ABNORMAL LOW (ref 39.0–52.0)
Hemoglobin: 10.3 g/dL — ABNORMAL LOW (ref 13.0–17.0)
MCH: 26.9 pg (ref 26.0–34.0)
MCHC: 29.1 g/dL — ABNORMAL LOW (ref 30.0–36.0)
MCV: 92.4 fL (ref 80.0–100.0)
Platelets: 170 10*3/uL (ref 150–400)
RBC: 3.83 MIL/uL — ABNORMAL LOW (ref 4.22–5.81)
RDW: 21 % — ABNORMAL HIGH (ref 11.5–15.5)
WBC: 9.5 10*3/uL (ref 4.0–10.5)
nRBC: 0 % (ref 0.0–0.2)

## 2022-11-13 LAB — BASIC METABOLIC PANEL
Anion gap: 8 (ref 5–15)
BUN: 30 mg/dL — ABNORMAL HIGH (ref 8–23)
CO2: 26 mmol/L (ref 22–32)
Calcium: 8 mg/dL — ABNORMAL LOW (ref 8.9–10.3)
Chloride: 106 mmol/L (ref 98–111)
Creatinine, Ser: 0.97 mg/dL (ref 0.61–1.24)
GFR, Estimated: >60 mL/min (ref >60)
Glucose, Bld: 145 mg/dL — ABNORMAL HIGH (ref 70–99)
Potassium: 3.4 mmol/L — ABNORMAL LOW (ref 3.5–5.1)
Sodium: 140 mmol/L (ref 135–145)

## 2022-11-13 LAB — PROCALCITONIN: Procalcitonin: 0.12 ng/mL

## 2022-11-13 LAB — GLUCOSE, CAPILLARY
Glucose-Capillary: 116 mg/dL — ABNORMAL HIGH (ref 70–99)
Glucose-Capillary: 139 mg/dL — ABNORMAL HIGH (ref 70–99)
Glucose-Capillary: 128 mg/dL — ABNORMAL HIGH (ref 70–99)
Glucose-Capillary: 151 mg/dL — ABNORMAL HIGH (ref 70–99)
Glucose-Capillary: 135 mg/dL — ABNORMAL HIGH (ref 70–99)
Glucose-Capillary: 105 mg/dL — ABNORMAL HIGH (ref 70–99)

## 2022-11-13 LAB — PROTIME-INR
INR: 3.2 — ABNORMAL HIGH (ref 0.8–1.2)
Prothrombin Time: 32.9 seconds — ABNORMAL HIGH (ref 11.4–15.2)

## 2022-11-13 LAB — C-REACTIVE PROTEIN: CRP: 1.9 mg/dL — ABNORMAL HIGH (ref <1.0)

## 2022-11-13 LAB — SEDIMENTATION RATE: Sed Rate: 8 mm/hr (ref 0–16)

## 2022-11-13 MED ORDER — POTASSIUM CHLORIDE 10 MEQ/100ML IV SOLN
10.0000 meq | INTRAVENOUS | Status: AC
  Administered 2022-11-13 – 2022-11-14 (×6): 10 meq via INTRAVENOUS
  Filled 2022-11-13 (×6): qty 100

## 2022-11-13 MED ORDER — FUROSEMIDE 10 MG/ML IJ SOLN
40.0000 mg | Freq: Four times a day (QID) | INTRAMUSCULAR | Status: AC
  Administered 2022-11-13 – 2022-11-14 (×3): 40 mg via INTRAVENOUS
  Filled 2022-11-13 (×3): qty 4

## 2022-11-13 MED ORDER — INSULIN ASPART 100 UNIT/ML IJ SOLN
0.0000 [IU] | INTRAMUSCULAR | Status: DC
  Administered 2022-11-13 – 2022-11-16 (×5): 2 [IU] via SUBCUTANEOUS

## 2022-11-13 MED ORDER — POTASSIUM CHLORIDE CRYS ER 20 MEQ PO TBCR
40.0000 meq | EXTENDED_RELEASE_TABLET | ORAL | Status: AC
  Administered 2022-11-13 (×2): 40 meq via ORAL
  Filled 2022-11-13 (×2): qty 2

## 2022-11-13 MED ORDER — PANTOPRAZOLE SODIUM 40 MG PO TBEC
40.0000 mg | DELAYED_RELEASE_TABLET | Freq: Every day | ORAL | Status: DC
  Administered 2022-11-14 – 2022-11-16 (×3): 40 mg via ORAL
  Filled 2022-11-13 (×3): qty 1

## 2022-11-13 MED ORDER — CHLORHEXIDINE GLUCONATE CLOTH 2 % EX PADS
6.0000 | MEDICATED_PAD | Freq: Every day | CUTANEOUS | Status: DC
  Administered 2022-11-14 – 2022-11-16 (×3): 6 via TOPICAL

## 2022-11-13 NOTE — Progress Notes (Signed)
PT Cancellation Note  Patient Details Name: [name] MRN: [id number] DOB: 1940/07/04   Cancelled Treatment:    Reason Eval/Treat Not Completed: Patient not medically ready  Patient transitioned back to BiPAP from [facility]. Spoke with RT and they agree pt's breathing is not doing well enough to attempt activity. Will reattempt 7/22   [name], PT Acute Rehabilitation Services  Office [phone]   [name] 11/13/2022, 12:56 PM

## 2022-11-13 NOTE — Plan of Care (Signed)
  Problem: Education: Goal: Understanding of CV disease, CV risk reduction, and recovery process will improve Outcome: Progressing Goal: Individualized Educational Video(s) Outcome: Progressing   Problem: Activity: Goal: Ability to return to baseline activity level will improve Outcome: Progressing   Problem: Cardiovascular: Goal: Ability to achieve and maintain adequate cardiovascular perfusion will improve Outcome: Progressing Goal: Vascular access site(s) Level 0-1 will be maintained Outcome: Progressing   Problem: Health Behavior/Discharge Planning: Goal: Ability to safely manage health-related needs after discharge will improve Outcome: Progressing   Problem: Education: Goal: Knowledge of prescribed regimen will improve Outcome: Progressing   Problem: Activity: Goal: Ability to tolerate increased activity will improve Outcome: Progressing   Problem: Bowel/Gastric: Goal: Gastrointestinal status for postoperative course will improve Outcome: Progressing   Problem: Clinical Measurements: Goal: Postoperative complications will be avoided or minimized Outcome: Progressing Goal: Signs and symptoms of graft occlusion will improve Outcome: Progressing   Problem: Skin Integrity: Goal: Demonstration of wound healing without infection will improve Outcome: Progressing   Problem: Education: Goal: Knowledge of General Education information will improve Description: Including pain rating scale, medication(s)/side effects and non-pharmacologic comfort measures Outcome: Progressing   Problem: Health Behavior/Discharge Planning: Goal: Ability to manage health-related needs will improve Outcome: Progressing   Problem: Clinical Measurements: Goal: Ability to maintain clinical measurements within normal limits will improve Outcome: Progressing Goal: Will remain free from infection Outcome: Progressing Goal: Diagnostic test results will improve Outcome: Progressing Goal:  Respiratory complications will improve Outcome: Progressing Goal: Cardiovascular complication will be avoided Outcome: Progressing   Problem: Activity: Goal: Risk for activity intolerance will decrease Outcome: Progressing   Problem: Nutrition: Goal: Adequate nutrition will be maintained Outcome: Progressing   Problem: Coping: Goal: Level of anxiety will decrease Outcome: Progressing   Problem: Elimination: Goal: Will not experience complications related to bowel motility Outcome: Progressing Goal: Will not experience complications related to urinary retention Outcome: Progressing   Problem: Pain Managment: Goal: General experience of comfort will improve Outcome: Progressing   Problem: Safety: Goal: Ability to remain free from injury will improve Outcome: Progressing   Problem: Skin Integrity: Goal: Risk for impaired skin integrity will decrease Outcome: Progressing   Problem: Education: Goal: Knowledge of risk factors and measures for prevention of condition will improve Outcome: Progressing   Problem: Coping: Goal: Psychosocial and spiritual needs will be supported Outcome: Progressing   Problem: Respiratory: Goal: Will maintain a patent airway Outcome: Progressing Goal: Complications related to the disease process, condition or treatment will be avoided or minimized Outcome: Progressing   Problem: Education: Goal: Ability to describe self-care measures that may prevent or decrease complications (Diabetes Survival Skills Education) will improve Outcome: Progressing Goal: Individualized Educational Video(s) Outcome: Progressing   Problem: Coping: Goal: Ability to adjust to condition or change in health will improve Outcome: Progressing   Problem: Fluid Volume: Goal: Ability to maintain a balanced intake and output will improve Outcome: Progressing   Problem: Health Behavior/Discharge Planning: Goal: Ability to identify and utilize available resources  and services will improve Outcome: Progressing Goal: Ability to manage health-related needs will improve Outcome: Progressing   Problem: Metabolic: Goal: Ability to maintain appropriate glucose levels will improve Outcome: Progressing   Problem: Nutritional: Goal: Maintenance of adequate nutrition will improve Outcome: Progressing Goal: Progress toward achieving an optimal weight will improve Outcome: Progressing   Problem: Skin Integrity: Goal: Risk for impaired skin integrity will decrease Outcome: Progressing   Problem: Tissue Perfusion: Goal: Adequacy of tissue perfusion will improve Outcome: Progressing

## 2022-11-13 NOTE — Progress Notes (Signed)
ANTICOAGULATION CONSULT NOTE - follow up  Pharmacy Consult for warfarin Indication: history DVT  Allergies  Allergen Reactions   Plavix [Clopidogrel Bisulfate]     Brain hemorrhage prev while on plavix and aspirin    Patient Measurements: Height: 5\' 9"  (175.3 cm) Weight: 70.4 kg (155 lb 3.3 oz) IBW/kg (Calculated) : 70.7   Vital Signs: Temp: 97.8 F (36.6 C) (07/21 0840) Temp Source: Oral (07/21 0840) BP: 164/72 (07/21 1056) Pulse Rate: 74 (07/21 1056)  Labs: Recent Labs    11/11/22 0815 11/12/22 0326 11/13/22 0710  HGB 10.1* 9.1* 10.3*  HCT 35.5* 31.7* 35.4*  PLT 200 191 170  LABPROT 24.4* 28.7* 32.9*  INR 2.2* 2.7* 3.2*  CREATININE 1.03 0.99 0.97    Estimated Creatinine Clearance: 59.5 mL/min (by C-G formula based on SCr of 0.97 mg/dL).   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 06/2011 Echo: EF 35-40%; b. 06/2012 Echo: EF 50%; c. 12/2016 Echo: EF 55-60%, no rwma, GRI DD, mild AS/MR, nl RV fxn, nl RVSP; d. 02/2021 Echo: EF 55%, GrII DD. Mild MR. Mild to mod TR. Sev AS by VTI.   AAA (abdominal aortic aneurysm) (HCC)    a. 05/2020 Abd u/s: 3.6cm.   Aortic calcification (HCC) 05/01/2013   Bilateral renal artery stenosis (HCC)    a. 06/2013 Angio/PTA: LRA: 95 (5x18 Herculink stent), RRA 60ost; b. 04/2021 Angio: mod RRA stenosis w/ patent LRA stent.   Carotid arterial disease (HCC)    a. 05/2020 Carotid U/S: RICA 1-39%, LICA 40-59%   CHF (congestive heart failure) (HCC)    Chronic kidney disease    COPD (chronic obstructive pulmonary disease) (HCC)    Coronary artery disease    a. 2013 s/p CABG x 3 (LIMA->LAD, VG->OM, VG->RPDA; b. 06/2012 MV: no ischemia; c. 02/2021 Cath: LM 100, RCA 90ost, 127m, free LIMA->LAD nl, VG->OM2 nl, VG->RPDA nl. Mod AS (mean grad , AVA 1.1cm^2). RHC w/ elev filling pressures.   History of prior cigarette smoking 04/11/2008   Qualifier: Diagnosis of  By:  Ermalene Searing MD, Amy     Hyperlipidemia    Hypertension    Hypothyroidism    Intraventricular hemorrhage (HCC) 07/12/2012   Ischemic cardiomyopathy    a. 06/2011 Echo: EF 35-40%; b. 06/2012 Echo: EF 50%; c. 12/2016 Echo: EF 55-60%, no rwma, GRI DD; d. 02/2021 Echo: Ef 55%, GrII DD.   Moderate aortic stenosis    a. 02/2021 Echo: EF 55%, GrII DD. Sev Ca2+ of AoV. Sev AS w/ AVA by VTI 0.79cm^2; b. 02/2021 Cath: Mod AS w/ mean grad . AVA 1.1cm^2.   PAF (paroxysmal atrial fibrillation) (HCC)    Peripheral arterial disease (HCC)    a. Previous left lower extremity stenting by Dr. Evette Cristal;  b. 12/2012 s/p bilat ostial common iliac stenting; c. 04/2021 Angio: Patent LRA stent, mod RRA stenosis. Small AAA. Sev plaque RSFA 2/ subtl occl of inf branch of profunda. Diff Ca2+ SFA dzs w/ collats from profunda and 3V runoff. Diffuse LSFA dzs w/ collats from profunda. Subtl PT dzs. 3V runoff-->Med Rx.   Right leg DVT (HCC) 08/13/2012   Subclavian artery stenosis, left (HCC) 05/2012   Status post stenting of the ostium and self-expanding stent placement to the left axillary artery   Venous insufficiency       Assessment: 82 yo male with COPD exacerbation, PNA and COVID positive. He is on warfarin PTA for history of DVT (  IVC in place). Pharmacy consulted to dose warfarin. He was also on full dose enoxaparin PTA (last dose given 11/07/22)  Today INR is 3.2, supra therapeutic. Warfarin dose was decreased from 2 mg  to 1 mg on 7/20. Patient is hypersensitive right now due to low po intake.  Amiodarone continues from prior to admission.  On ASA 81mg  daily  Hgb 10.3 stable,  pltc wnl stable. No bleeding reported.   Home warfarin dose: 2.5mg /day except take none on Saturday, last dose taken PTA 7/14.   Goal of Therapy:  INR 2-3 Monitor platelets by anticoagulation protocol: Yes   Plan:  Hold Warfarin today, will resume tomorrow if INR <3 Daily PT/INR  Gwynn Burly PharmD 11/13/2022 11:47 AM

## 2022-11-13 NOTE — Progress Notes (Signed)
NAME:  CYREE CHUONG, MRN:  [id number], DOB:  [birth year], LOS: 6 ADMISSION DATE:  11/07/2022, CONSULTATION DATE:  11/07/2022 REFERRING MD:  Eloise Harman - EDP, CHIEF COMPLAINT: PNA, COVID positive  BRIEF  82 year old man who presented to [facility] ED 7/15 for SOB. PMHx significant for HTN, HLD, CAD (s/p CABG), PAF with history of DVT (s/p IVC filter, on Coumadin), HFrEF (Echo 6/28 with EF 55-60%), COPD, AAA, bilateral RAS, CKD stage IIIb, subclavian artery stenosis and severe PAD (s/p R iliofemoral endarterectomy, popliteal endarterectomy and R TMA with VVS 6/25, 7/2). Recently admitted to [facility] 6/18 - 7/8 for sepsis due to RLE infection (s/p surgical intervention), discharged to rehab [facility]).  History is obtained primarily from patient's wife/from chart review. Per patient's wife, discharged from [facility] 7/8 to [facility] for rehab postoperatively. EMS called for SOB/respiratory distress; per report SpO2 73% on RA, improved to 100% with NRB mask. Per facility records, patient had been on ceftriaxone/clindamycin x 5 days for PNA treatment.  On ED arrival, patient temp was 99.58F with HR 80, RR 23, BP 117/93, SpO2 100% on NRB, transitioned to BiPAP. Labs were notable for WBC 11.9, Hgb 9.7, Plt 305. INR 3.6 (on Coumadin). Na 145, K 4.7, CO2 25, Cr 1.18 (baseline); LFTs WNL. BNP 997 (585), trop 76 > 89, suspect demand-related. COVID positive. CXR demonstrated worsening RML/RLL PNA with R pleural effusion. Broad-spectrum antibiotic coverage initiated.  PCCM consulted for ICU admission.  Pertinent Medical History:  HFrEF, AAA, bilateral renal artery stenosis, CKD stage IIIb, COPD, CAD, HTN, HLD, PAF with history of DVT anticoagulated with Coumadin and IVC filter in place, PAD, and, subclavian artery stenosis   Significant Hospital Events: Including procedures, antibiotic start and stop dates in addition to other pertinent events   7/15 - Presented to ED with SOB/resp distress. Placed on BiPAP. CXR with  multifocal PNA, primarily RML/RLL, R pleural effusion. Broad-spectrum antibiotics started. PCCM consulted for admission. 7/16 of BIPAP to [place] -0 Doing well on BiPAP. Transitioned to 4L [place] and tolerating well. Hypoglycemic borderline this morning. Diuresed well with lasix, remains positive for the admission.   Interim History / Subjective:   7/21: ccm recalled. Afebrilre with normal WBC. No new microbes but on 60% fio2 aat 50L rate with biPAP.c XR significantly worse; Marland Kitchen Per RN: refused BiPAP overnight . This morning desaturating -> applied BiPAP and RN says unable to wean off. HE says desaturates within 1 min of beng off BiPAP  - still on IV steroid solumedrol 80mg  Q8h since 7/17 - alsop on PO amio  ? Since feb 2024   Objective:  Blood pressure (!) 164/78, pulse 70, temperature (!) 97.5 F (36.4 C), temperature source Axillary, resp. rate (!) 21, height 5\' 9"  (1.753 m), weight 70.4 kg, SpO2 94%.    FiO2 (%):  [55 %-60 %] 60 %   Intake/Output Summary (Last 24 hours) at 11/13/2022 1724 Last data filed at 11/13/2022 0845 Gross per 24 hour  Intake 460 ml  Output 600 ml  Net -140 ml   Filed Weights   11/11/22 0700 11/13/22 0404 11/13/22 0500  Weight: 67.8 kg 72.8 kg 70.4 kg     Physical Examination:  General Appearance:  Looks deconditioned. On BiPAP.  Head:  Normocephalic, without obvious abnormality, atraumatic Eyes:  PERRL - yes, conjunctiva/corneas - muddy     Ears:  Normal external ear canals, both ears Nose:  G tube - no. But has BiPAP Throat:  ETT TUBE - no , OG tube -  no Neck:  Supple,  No enlargement/tenderness/nodules Lungs: Clear to auscultation bilaterally, RR high 20s low 30s but NOT paradoxical Heart:  S1 and S2 normal, no murmur, CVP - no.  Pressors - no Abdomen:  Soft, no masses, no organomegaly Genitalia / Rectal:  Not done Extremities:  Extremities- intact but Staples in place from recent Rt TMA.  Skin: intact in exposed areas . Sacral area - not  examined Neurologic:  Sedation - none -> RASS - +1 . Moves all 4s - yes. CAM-ICU - did not test . Orientation - answers some simple questions      Resolved Hospital Problem List:  ABX   Vanc 7/16 >.7/16 - Cefepime 7/16 - 7/21  Assessment & Plan:  Multifocal PNA, HCAP vs. COVID-associated versuy Amio toxicity COVID-19 infection COPD with acute exacerbation in the setting of PNA  7/21: reacalled due to worsening respiratory failure. Heading to but not in immediate need of intubation. CXR worse. DDx Progressive covid ALI  PLan - continuous BiPAP - intubate if worse - - Supplemental O2 support for O2 sat 88-92% - \ Scheduled nebs to continue - IV steroids to continue - Continue Cefepime (last day 7/21/) - check ESR, CRP, PCT  - based on this stop amio versu consider Actemra verus change ABx-   Chronic HFrEF; has had recovery of ejection fraction to 55 to 60% on most recent Echo Severe aortic valve stenosis - Cardiac monitoring - Diuresis as tolerated - lasix 40mg  today + K supp.  - Likely needs inpatient cardiology appointment  Paroxysmal atrial fibrillation anticoagulated with Coumadin PTA. Failed DCCV January 2024. History of DVT Paroxysmal Afib - Continue amiodarone PO (might need to dc in setting of Resp failure);  - Warfarin per pharmacy - Cardiac monitoring - Optimize electrolytes for K > 4, Mg > 2  Recent critical limb ischemia of right lower extremity with gangrene Extensive peripheral arterial disease - S/p R iliofem EA/popliteal EA 6/25, R TMA 7/2 - Outpatient management per VVS - ASA/statin - Continue Coumadin - PT/OT  Abdominal aortic aneurysm- 5.5 cm on recent imaging, enlarged per prior image - Continue ASA/statin - Outpatient monitoring/mgmt per VVS  CKD stage IIIa, stable - Trend BMP - Replete electrolytes as indicated - Monitor I&Os  Hypothyroidism - Continue levothyroxine  Anemia - Trend H&H - Monitor for signs of active bleeding -  Transfuse for Hgb < 7.0 or hemodynamically significant bleeding     Best practice:  Diet: NPO Pain/Anxiety/Delirium protocol (if indicated): x VAP protocol (if indicated): x DVT prophylaxis: Coumadin GI prophylaxis: ppi Glucose control: ssi Mobility: bed rest Code Status: full code (Dr Marchelle Gearing cofirmed with patient 11/13/22)  Family Communication: wife Quinntin Malter updated 6:44 PM 11/13/2022   Interdisciplinary Goals of Care Family Meeting   Date carried out:: 11/13/2022  Location of the meeting: Phone conference  Member's involved: Family Member or next of kin and Other: wife  Durable Power of Pensions consultant or Environmental health practitioner: Wife    Discussion: We discussed goals of care for [employer] .    Code status: Full Code  Disposition: Continue current acute care   Time spent for the meeting: 15 min over phone    - infirmed her that odds of recovery is slim if he gets intubated. She said that he is full code but also has living well that he wants natural death. Apparently, he  has also wanted to die at home. She understand currently he is lucid and wants full code.  Informed her that CPR will be of 0% benefit . Also explained that intubation is marker of poor prognosis and unlikely he will survive illness. She said she will come by and talk goals of care and see if he will be aligned with No CPR/ No intubation.   - she is upset that she is not at his bedside. She has covid herself . She is HCPOA. Says wants to spend time with him and does not want him to die without seeing him. Her diagnosis was Monday 11/07/22 but symptoms started 11/04/22. She is covid antigen negative 11/13/2022 . She can visit him .11/13/2022 onwards with mask     Disposition: move to 5W to ICU     ATTESTATION & SIGNATURE   The patient ORLIE CUNDARI is critically ill with multiple organ systems failure and requires high complexity decision making for assessment and support, frequent  evaluation and titration of therapies, application of advanced monitoring technologies and extensive interpretation of multiple databases and discussion with other appropriate health care personnel such as bedside nurses, social workers, case Production designer, theatre/television/film, consultants, respiratory therapists, nutritionists, secretaries etc.,  Critical care time includes but is not restricted to just documentation time. Documentation can happen in parallel or sequential to care time depending on case mix urgency and priorities for the shift. So, overall critical Care Time devoted to patient care services described in this note is  60  Minutes.   This time reflects time of care of this signee Dr Kalman Shan which includ does not reflect procedure time, or teaching time or supervisory time of PA/NP/Med student/Med Resident etc but could involve care discussion time     Dr. Kalman Shan, M.D., [facility].C.P Pulmonary and Critical Care Medicine Staff Physician, [facility] Uncertain Pulmonary and Critical Care Pager: [phone], If no answer or between  15:00h - 7:00h: call [phone]  11/13/2022 6:58 PM    LABS    PULMONARY Recent Labs  Lab 11/07/22 1414 11/07/22 1415 11/07/22 1730  HCO3 25.5  --  23.1  TCO2 27 25 25   O2SAT 34  --  69    CBC Recent Labs  Lab 11/11/22 0815 11/12/22 0326 11/13/22 0710  HGB 10.1* 9.1* 10.3*  HCT 35.5* 31.7* 35.4*  WBC 7.9 8.6 9.5  PLT 200 191 170    COAGULATION Recent Labs  Lab 11/09/22 0549 11/10/22 0352 11/11/22 0815 11/12/22 0326 11/13/22 0710  INR 2.8* 2.1* 2.2* 2.7* 3.2*    CARDIAC  No results for input(s): "TROPONINI" in the last 168 hours. No results for input(s): "PROBNP" in the last 168 hours.   CHEMISTRY Recent Labs  Lab 11/07/22 1407 11/07/22 1414 11/08/22 0629 11/09/22 0549 11/10/22 0352 11/11/22 0815 11/12/22 0326 11/13/22 0710  NA 145   < > 144 145 142 144 143 140  K 4.7   < > 4.0 4.4 4.1 4.6 3.5 3.4*  CL 115*   <  > 115* 109 110 108 109 106  CO2 25  --  22 27 29 29 29 26   GLUCOSE 120*   < > 96 91 138* 118* 127* 145*  BUN 28*   < > 25* 28* 24* 26* 27* 30*  CREATININE 1.18   < > 1.13 1.14 1.14 1.03 0.99 0.97  CALCIUM 8.5*  --  7.8* 8.4* 8.0* 8.4* 8.2* 8.0*  MG 2.3  --  2.1 2.2  --   --   --   --   PHOS  --   --  3.4 2.6  --   --   --   --    < > = values in this interval not displayed.   Estimated Creatinine Clearance: 59.5 mL/min (by C-G formula based on SCr of 0.97 mg/dL).   LIVER Recent Labs  Lab 11/07/22 1407 11/08/22 1008 11/09/22 0549 11/10/22 0352 11/11/22 0815 11/12/22 0326 11/13/22 0710  AST 19  --   --   --   --   --   --   ALT 22  --   --   --   --   --   --   ALKPHOS 61  --   --   --   --   --   --   BILITOT 0.6  --   --   --   --   --   --   PROT 5.9*  --   --   --   --   --   --   ALBUMIN 2.6*  --   --   --   --   --   --   INR 3.6*   < > 2.8* 2.1* 2.2* 2.7* 3.2*   < > = values in this interval not displayed.     INFECTIOUS Recent Labs  Lab 11/07/22 1411 11/07/22 1617 11/07/22 1715 11/10/22 0352  LATICACIDVEN 1.7  --  1.6  --   PROCALCITON  --  0.14  --  0.10     ENDOCRINE CBG (last 3)  Recent Labs    11/13/22 0842 11/13/22 1229 11/13/22 1725  GLUCAP 139* 128* 151*         IMAGING x48h  - image(s) personally visualized  -   highlighted in bold No results found.

## 2022-11-13 NOTE — Progress Notes (Addendum)
PROGRESS NOTE    [name]  JXB:[id number] DOB: 1940/10/27 DOA: 11/07/2022 PCP: [name], MD    Chief Complaint  Patient presents with   Shortness of Breath    Brief Narrative:   82 year old man who presented to [facility] ED 7/15 for SOB. PMHx significant for HTN, HLD, CAD (s/p CABG), PAF with history of DVT (s/p IVC filter, on Coumadin), HFrEF (Echo 6/28 with EF 55-60%), COPD, AAA, bilateral RAS, CKD stage IIIb, subclavian artery stenosis and severe PAD (s/p R iliofemoral endarterectomy, popliteal endarterectomy and R TMA with VVS 6/25, 7/2). Recently admitted to [facility] 6/18 - 7/8 for sepsis due to RLE infection (s/p surgical intervention), discharged to rehab [facility]). EMS called for SOB/respiratory distress; per report SpO2 73% on RA, improved to 100% with NRB mask. Per facility records, patient had been on ceftriaxone/clindamycin x 5 days for PNA treatment.    - On ED arrival, patient temp was 99.51F with HR 80, RR 23, BP 117/93, SpO2 100% on NRB, transitioned to BiPAP. Labs were notable for WBC 11.9, Hgb 9.7, Plt 305. INR 3.6 (on Coumadin). Na 145, K 4.7, CO2 25, Cr 1.18 (baseline); LFTs WNL. BNP 997 (585), trop 76 > 89, suspect demand-related. COVID positive. CXR demonstrated worsening RML/RLL PNA with R pleural effusion. The patient was placed on IV steroids. Broad-spectrum antibiotic coverage also initiated.  Patient was admitted to [facility] continue to continuous BiPAP requirement, he transferred to [facility] service 7/17.  The patient's work of breathing and oxygen requirements have increased overnight. This morning he has needed to go back on BIPAP with 60% at 50L HHF. The patient is tachypneic with accessory muscle use. He is awake and alert and oriented x 3. I have discussed the patient with PCCM. They will see him.  Status is: Inpatient   Consultants:  PCCM  Subjective:  The patient is resting comfortably. No new complaints. He is awake, alert, and in moderate distress due to  work of breathing. He is very anxious about the BIPAP and wants it off.  Objective: Vitals:   11/13/22 1230 11/13/22 1404 11/13/22 1405 11/13/22 1600  BP: (!) 164/75   (!) 164/78  Pulse: 72   70  Resp: (!) 27 (!) 33 (!) 33 (!) 21  Temp: (!) 97.5 F (36.4 C)     TempSrc: Axillary     SpO2: 96% 98% 98% 94%  Weight:      Height:        Intake/Output Summary (Last 24 hours) at 11/13/2022 1647 Last data filed at 11/13/2022 0845 Gross per 24 hour  Intake 460 ml  Output 600 ml  Net -140 ml   Filed Weights   11/11/22 0700 11/13/22 0404 11/13/22 0500  Weight: 67.8 kg 72.8 kg 70.4 kg    Exam:  Constitutional:  The patient is awake, alert, and oriented x 3. He is anxious and in moderate distress from increased work of breathing. Respiratory Positive for increased work of breathing, tachypnea, and accessory muscle use. Diminished lung sounds bilaterally. No tactile fremitus Cardiovascular:  Regular rate and rhythm No murmurs, ectopy, or gallups. No lateral PMI. No thrills. Abdomen:  Abdomen is soft, non-tender, non-distended No hernias, masses, or organomegaly Normoactive bowel sounds.  Musculoskeletal:  No cyanosis, clubbing, or edema Staples in place from recent Rt TMA.  Skin:  No rashes, lesions, ulcers palpation of skin: no induration or nodules Neurologic:  CN 2-12 intact Sensation all 4 extremities intact Psychiatric:  Mental status Mood, affect appropriate Orientation to  person, place, time  judgment and insight appear intact   Pictures on 7/17         Data Reviewed: I have personally reviewed following labs and imaging studies  CBC: Recent Labs  Lab 11/07/22 1407 11/07/22 1414 11/09/22 0549 11/10/22 0352 11/11/22 0815 11/12/22 0326 11/13/22 0710  WBC 11.9*   < > 7.9 5.7 7.9 8.6 9.5  NEUTROABS 10.8*  --   --   --   --   --   --   HGB 9.7*   < > 8.7* 9.3* 10.1* 9.1* 10.3*  HCT 35.9*   < > 30.6* 33.3* 35.5* 31.7* 35.4*  MCV 101.4*   < > 98.7  96.2 94.4 93.8 92.4  PLT 305   < > 200 195 200 191 170   < > = values in this interval not displayed.    Basic Metabolic Panel: Recent Labs  Lab 11/07/22 1407 11/07/22 1414 11/08/22 0629 11/09/22 0549 11/10/22 0352 11/11/22 0815 11/12/22 0326 11/13/22 0710  NA 145   < > 144 145 142 144 143 140  K 4.7   < > 4.0 4.4 4.1 4.6 3.5 3.4*  CL 115*   < > 115* 109 110 108 109 106  CO2 25  --  22 27 29 29 29 26   GLUCOSE 120*   < > 96 91 138* 118* 127* 145*  BUN 28*   < > 25* 28* 24* 26* 27* 30*  CREATININE 1.18   < > 1.13 1.14 1.14 1.03 0.99 0.97  CALCIUM 8.5*  --  7.8* 8.4* 8.0* 8.4* 8.2* 8.0*  MG 2.3  --  2.1 2.2  --   --   --   --   PHOS  --   --  3.4 2.6  --   --   --   --    < > = values in this interval not displayed.    GFR: Estimated Creatinine Clearance: 59.5 mL/min (by C-G formula based on SCr of 0.97 mg/dL).  Liver Function Tests: Recent Labs  Lab 11/07/22 1407  AST 19  ALT 22  ALKPHOS 61  BILITOT 0.6  PROT 5.9*  ALBUMIN 2.6*    CBG: Recent Labs  Lab 11/12/22 1709 11/12/22 2306 11/13/22 0400 11/13/22 0842 11/13/22 1229  GLUCAP 181* 132* 116* 139* 128*     Recent Results (from the past 240 hour(s))  Resp panel by RT-PCR (RSV, Flu A&B, Covid) Anterior Nasal Swab     Status: Abnormal   Collection Time: 11/07/22  2:02 PM   Specimen: Anterior Nasal Swab  Result Value Ref Range Status   SARS Coronavirus 2 by RT PCR POSITIVE (A) NEGATIVE Final   Influenza A by PCR NEGATIVE NEGATIVE Final   Influenza B by PCR NEGATIVE NEGATIVE Final    Comment: (NOTE) The Xpert Xpress SARS-CoV-2/FLU/RSV plus assay is intended as an aid in the diagnosis of influenza from Nasopharyngeal swab specimens and should not be used as a sole basis for treatment. Nasal washings and aspirates are unacceptable for Xpert Xpress SARS-CoV-2/FLU/RSV testing.  Fact Sheet for Patients: BloggerCourse.com  Fact Sheet for Healthcare  Providers: SeriousBroker.it  This test is not yet approved or cleared by the [place] FDA and has been authorized for detection and/or diagnosis of SARS-CoV-2 by FDA under an Emergency Use Authorization (EUA). This EUA will remain in effect (meaning this test can be used) for the duration of the COVID-19 declaration under Section 564(b)(1) of the Act, 21 U.S.C. section 360bbb-3(b)(1), unless  the authorization is terminated or revoked.     Resp Syncytial Virus by PCR NEGATIVE NEGATIVE Final    Comment: (NOTE) Fact Sheet for Patients: BloggerCourse.com  Fact Sheet for Healthcare Providers: SeriousBroker.it  This test is not yet approved or cleared by the [place] FDA and has been authorized for detection and/or diagnosis of SARS-CoV-2 by FDA under an Emergency Use Authorization (EUA). This EUA will remain in effect (meaning this test can be used) for the duration of the COVID-19 declaration under Section 564(b)(1) of the Act, 21 U.S.C. section 360bbb-3(b)(1), unless the authorization is terminated or revoked.  Performed at [facility], [address], [place], [place] [address]   Blood culture (routine x 2)     Status: None   Collection Time: 11/07/22  3:06 PM   Specimen: BLOOD  Result Value Ref Range Status   Specimen Description BLOOD RIGHT ANTECUBITAL  Final   Special Requests   Final    BOTTLES DRAWN AEROBIC AND ANAEROBIC Blood Culture results may not be optimal due to an excessive volume of blood received in culture bottles   Culture   Final    NO GROWTH 5 DAYS Performed at [facility], [address], [place], [place] [address]    Report Status 11/12/2022 FINAL  Final  Blood culture (routine x 2)     Status: None   Collection Time: 11/07/22  3:07 PM   Specimen: BLOOD  Result Value Ref Range Status   Specimen Description BLOOD LEFT ANTECUBITAL  Final   Special Requests    Final    BOTTLES DRAWN AEROBIC AND ANAEROBIC Blood Culture results may not be optimal due to an excessive volume of blood received in culture bottles   Culture   Final    NO GROWTH 5 DAYS Performed at [facility], [address] [place], [place] [address]    Report Status 11/12/2022 FINAL  Final  MRSA Next Gen by PCR, Nasal     Status: None   Collection Time: 11/07/22  8:15 PM   Specimen: Nasal Mucosa; Nasal Swab  Result Value Ref Range Status   MRSA by PCR Next Gen NOT DETECTED NOT DETECTED Final    Comment: (NOTE) The GeneXpert MRSA Assay (FDA approved for NASAL specimens only), is one component of a comprehensive MRSA colonization surveillance program. It is not intended to diagnose MRSA infection nor to guide or monitor treatment for MRSA infections. Test performance is not FDA approved in patients less than 2 years old. Performed at [facility], [address] [place], [place] [address]     Scheduled Meds:  albuterol  10 mg Nebulization Once   amiodarone  200 mg Oral BID   aspirin EC  81 mg Oral Q0600   insulin aspart  0-9 Units Subcutaneous Q4H   ipratropium-albuterol  3 mL Nebulization Q6H   levothyroxine  150 mcg Oral Q0600   methylPREDNISolone (SOLU-MEDROL) injection  80 mg Intravenous Q8H   mouth rinse  15 mL Mouth Rinse 4 times per day   Warfarin - Pharmacist Dosing Inpatient   Does not apply q1600   Continuous Infusions:  ceFEPime (MAXIPIME) IV 2 g (11/13/22 0925)   Assessment & Plan:   Principal Problem:   Multifocal pneumonia  Acute respiratory failure Multifocal PNA, HCAP vs. COVID-associated COVID-19 infection COPD with acute exacerbation in the setting of PNA -Admitted to ICU due to continuous BiPAP, currently on intermittent BiPAP daytime and BiPAP continue with nighttime. -  Today the patient has had an increase in oxygen requirements and has not tolerated coming off of BIPAP. He is now requiring 60% FIO2 with 50L by BIPAP. -He is  tachypneic with accessory muscle use. He is anxious due to BIPAP and wants it off.  -PCCM has been reconsulted. - Scheduled nebs - IV steroids, changed hydrocortisone to Solu-Medrol. - Pulmonary hygiene > IS/FV - ABX cefepime continue for HAP coverage. DC vanco with negative MRSA PCR.  - F/u Cx data   Acute on chronic HFrEF; has had recovery of ejection fraction to 55 to 60% on most recent Echo Severe aortic valve stenosis - Cardiac monitoring -Difficultly elevated BNP, trending up, will give 40 mg of IV Lasix today, will dose daily. - Outpatient Cardiology f/u for AS, needs to recover from current hospitalization prior to consideration of intervention   Paroxysmal atrial fibrillation anticoagulated with Coumadin PTA. Failed DCCV January 2024. History of DVT Paroxysmal Afib - Continue amiodarone PO - Warfarin per pharmacy - Cardiac monitoring - Optimize electrolytes for K > 4, Mg > 2   Recent critical limb ischemia of right lower extremity with gangrene Extensive peripheral arterial disease - S/p R iliofem EA/popliteal EA 6/25, R TMA 7/2 - Outpatient management per VVS - ASA/statin - Continue Coumadin - PT/OT -Wound looks clean.  The pictures in the media section. -Staples to be removed 4 weeks from 7/2 (7/30).   Abdominal aortic aneurysm- 5.5 cm on recent imaging, enlarged per prior image - Continue ASA/statin - Outpatient monitoring/mgmt per VVS   CKD stage IIIa, stable - Trend BMP - Replete electrolytes as indicated - Monitor I&Os   Hypothyroidism - Continue levothyroxine   Anemia - Trend H&H - Monitor for signs of active bleeding - Transfuse for Hgb < 7.0 or hemodynamically significant bleeding    DVT prophylaxis: On warfarin Code Status: full Family Communication: D/W daughter at bedside 7/17 Disposition:   LOS: 6 days   Sten Dematteo Gerri Lins, MD [facility]  To contact the attending provider between 7A-7P or the covering provider during after hours 7P-7A,  please log into the web site [email or web address] and access using universal [place] password for that web site. If you do not have the password, please call the hospital operator.  11/13/2022, 4:47 PM

## 2022-11-13 NOTE — Significant Event (Signed)
Rapid Response Event Note   Reason for Call :  SpO2-49%  Per RN, pt pulled off his bipap and became hypoxic.  Initial Focused Assessment:  Pt lying in bed with eyes closed. His breathing is tachypneic and labored. He is back on his bipap at 100% FiO2. He awakens easily, follows commands with all extremities, and answers simple questions by nodding/shaking his head. He denies CP/SOB. Lungs clear/diminished. Skin warm to touch.  HR-78, BP-146/97, RR-30, SpO2-93% on bipap 100%  PCCM evaluated pt earlier with orders to tx to ICU d/t high risk for decline/intubation.   Interventions:  Pt tx to 3M04  Plan of Care:  Tx to 3M04   Event Summary:   MD Notified: Ramaswamy notified by day shift RN Call Time:1939 Arrival [phone] End [id number]  Terrilyn Saver, RN

## 2022-11-13 NOTE — Progress Notes (Addendum)
eLink Physician-Brief Progress Note Patient Name: [name] DOB: 05-24-1940 MRN: [id number]   Date of Service  11/13/2022  HPI/Events of Note  82 year old male with a history of heart failure with reduced ejection fraction, COPD, DVT with IVC filter and Coumadin on board and severe PAD who was recently admitted due to right lower extremity infection and discharged to rehab presents to the emergency department with respiratory failure requiring nonrebreather and BiPAP who initially improved and was sent to the floor but returns to the ICU today with worsening dyspnea and work of breathing.  COVID-positive.  Refused BiPAP overnight and desaturated this morning to 49%.  Tachypneic and hypertensive on examination requiring BiPAP to maintain SpO2 of 99%.  Metabolic panel with no acute findings.  Minimally elevated CRP.  Procalcitonin intermediate.  CBC with mild anemia.  INR therapeutic at 3.2.  Chest x-ray with worsening right-sided effusion and bilateral infiltrates.   eICU Interventions  Maintain BiPAP therapy, low threshold to proceed with intubation vs discuss with wife about comfort if he has recurrent desaturation.  Substantial leak on examination, may need readjustment/mask changed by RT.  Maintain current settings otherwise.     Remains on steroids, amiodarone still on board for now, cefepime, actively diuresing.  Will add on additional diuresis. (Does not look like he actually received a dose today)  Maintain NPO     Intervention Category Evaluation Type: New Patient Evaluation  [name] 11/13/2022, 8:33 PM

## 2022-11-13 NOTE — Progress Notes (Addendum)
RT called to bedside by RN. RN placed pt back on BiPAP. RT at bedside to assess pt. Pt is on BiPAP at this time with improvement to SpO2. HHFNC is on and on stby at bedside.

## 2022-11-14 ENCOUNTER — Inpatient Hospital Stay (HOSPITAL_COMMUNITY): Payer: Medicare Other

## 2022-11-14 LAB — CBC
HCT: 42.5 % (ref 39.0–52.0)
Hemoglobin: 12.1 g/dL — ABNORMAL LOW (ref 13.0–17.0)
MCH: 27.5 pg (ref 26.0–34.0)
MCHC: 28.5 g/dL — ABNORMAL LOW (ref 30.0–36.0)
MCV: 96.6 fL (ref 80.0–100.0)
Platelets: 159 10*3/uL (ref 150–400)
RBC: 4.4 MIL/uL (ref 4.22–5.81)
RDW: 21.3 % — ABNORMAL HIGH (ref 11.5–15.5)
WBC: 10.1 10*3/uL (ref 4.0–10.5)
nRBC: 0.2 % (ref 0.0–0.2)

## 2022-11-14 LAB — BASIC METABOLIC PANEL
Anion gap: 11 (ref 5–15)
BUN: 31 mg/dL — ABNORMAL HIGH (ref 8–23)
CO2: 26 mmol/L (ref 22–32)
Calcium: 8.5 mg/dL — ABNORMAL LOW (ref 8.9–10.3)
Chloride: 107 mmol/L (ref 98–111)
Creatinine, Ser: 1.02 mg/dL (ref 0.61–1.24)
GFR, Estimated: >60 mL/min (ref >60)
Glucose, Bld: 99 mg/dL (ref 70–99)
Potassium: 4.7 mmol/L (ref 3.5–5.1)
Sodium: 144 mmol/L (ref 135–145)

## 2022-11-14 LAB — PROTIME-INR
INR: 3 — ABNORMAL HIGH (ref 0.8–1.2)
Prothrombin Time: 31 seconds — ABNORMAL HIGH (ref 11.4–15.2)

## 2022-11-14 LAB — GLUCOSE, CAPILLARY
Glucose-Capillary: 102 mg/dL — ABNORMAL HIGH (ref 70–99)
Glucose-Capillary: 105 mg/dL — ABNORMAL HIGH (ref 70–99)
Glucose-Capillary: 102 mg/dL — ABNORMAL HIGH (ref 70–99)
Glucose-Capillary: 97 mg/dL (ref 70–99)
Glucose-Capillary: 116 mg/dL — ABNORMAL HIGH (ref 70–99)
Glucose-Capillary: 110 mg/dL — ABNORMAL HIGH (ref 70–99)

## 2022-11-14 LAB — BRAIN NATRIURETIC PEPTIDE: B Natriuretic Peptide: 933 pg/mL — ABNORMAL HIGH (ref 0.0–100.0)

## 2022-11-14 MED ORDER — BUDESONIDE 0.5 MG/2ML IN SUSP
0.5000 mg | Freq: Two times a day (BID) | RESPIRATORY_TRACT | Status: DC
  Administered 2022-11-14 – 2022-11-16 (×5): 0.5 mg via RESPIRATORY_TRACT
  Filled 2022-11-14 (×5): qty 2

## 2022-11-14 MED ORDER — FUROSEMIDE 10 MG/ML IJ SOLN
40.0000 mg | Freq: Once | INTRAMUSCULAR | Status: AC
  Administered 2022-11-14: 40 mg via INTRAVENOUS
  Filled 2022-11-14: qty 4

## 2022-11-14 MED ORDER — METHYLPREDNISOLONE SODIUM SUCC 125 MG IJ SOLR
80.0000 mg | Freq: Every day | INTRAMUSCULAR | Status: DC
  Administered 2022-11-15: 80 mg via INTRAVENOUS
  Filled 2022-11-14: qty 2

## 2022-11-14 MED ORDER — ARFORMOTEROL TARTRATE 15 MCG/2ML IN NEBU
15.0000 ug | INHALATION_SOLUTION | Freq: Two times a day (BID) | RESPIRATORY_TRACT | Status: DC
  Administered 2022-11-14 – 2022-11-16 (×5): 15 ug via RESPIRATORY_TRACT
  Filled 2022-11-14 (×5): qty 2

## 2022-11-14 NOTE — Progress Notes (Signed)
NAME:  [name], MRN:  [id number], DOB:  Mar 29, 1941, LOS: 7 ADMISSION DATE:  11/07/2022, CONSULTATION DATE:  11/07/2022 REFERRING MD:  Eloise Harman - EDP, CHIEF COMPLAINT: PNA, COVID positive  BRIEF  82 year old man who presented to [facility] ED 7/15 for SOB. PMHx significant for HTN, HLD, CAD (s/p CABG), PAF with history of DVT (s/p IVC filter, on Coumadin), HFrEF (Echo 6/28 with EF 55-60%), COPD, AAA, bilateral RAS, CKD stage IIIb, subclavian artery stenosis and severe PAD (s/p R iliofemoral endarterectomy, popliteal endarterectomy and R TMA with VVS 6/25, 7/2). Recently admitted to [facility] 6/18 - 7/8 for sepsis due to RLE infection (s/p surgical intervention), discharged to rehab [facility]).  History is obtained primarily from patient's wife/from chart review. Per patient's wife, discharged from [facility] 7/8 to [facility] for rehab postoperatively. EMS called for SOB/respiratory distress; per report SpO2 73% on RA, improved to 100% with NRB mask. Per facility records, patient had been on ceftriaxone/clindamycin x 5 days for PNA treatment.  On ED arrival, patient temp was 99.6F with HR 80, RR 23, BP 117/93, SpO2 100% on NRB, transitioned to BiPAP. Labs were notable for WBC 11.9, Hgb 9.7, Plt 305. INR 3.6 (on Coumadin). Na 145, K 4.7, CO2 25, Cr 1.18 (baseline); LFTs WNL. BNP 997 (585), trop 76 > 89, suspect demand-related. COVID positive. CXR demonstrated worsening RML/RLL PNA with R pleural effusion. Broad-spectrum antibiotic coverage initiated.  PCCM consulted for ICU admission.  Pertinent Medical History:  HFrEF, AAA, bilateral renal artery stenosis, CKD stage IIIb, COPD, CAD, HTN, HLD, PAF with history of DVT anticoagulated with Coumadin and IVC filter in place, PAD, and, subclavian artery stenosis   Significant Hospital Events: Including procedures, antibiotic start and stop dates in addition to other pertinent events   7/15 - Presented to ED with SOB/resp distress. Placed on BiPAP. CXR with  multifocal PNA, primarily RML/RLL, R pleural effusion. Broad-spectrum antibiotics started. PCCM consulted for admission. 7/16 of BIPAP to [place] -0 Doing well on BiPAP. Transitioned to 4L [place] and tolerating well. Hypoglycemic borderline this morning. Diuresed well with lasix, remains positive for the admission.  7/17 to [facility] 7/21 called back/ moved to ICU, worsening CXR, hypoxia, BiPAP  Interim History / Subjective:  On BiPAP overnight, off this am to HHFNC 70%, 40L Pt states he feels much better, denies productive cough, SOB or current pain  Objective:  Blood pressure (!) 171/71, pulse 67, temperature (!) 97.3 F (36.3 C), temperature source Axillary, resp. rate 18, height 5\' 9"  (1.753 m), weight 70.4 kg, SpO2 93%.    FiO2 (%):  [60 %-100 %] 70 %   Intake/Output Summary (Last 24 hours) at 11/14/2022 0928 Last data filed at 11/14/2022 0600 Gross per 24 hour  Intake 904.07 ml  Output 3150 ml  Net -2245.93 ml   Filed Weights   11/11/22 0700 11/13/22 0404 11/13/22 0500  Weight: 67.8 kg 72.8 kg 70.4 kg   Physical Examination: General:  chronically ill, thin and frail appearing elderly male sitting in bed in NAD HEENT: MM pink/minimally dry, edentulous, pupils 3/r Neuro: awake, oriented x 3, MAE- generalized weakness CV: rr, no murmur PULM:  clear, diminished bases R>L, on HHFNC, comfortable at rest with normal RR, while talking, RR mid 20's and sats 95-97 down to 92% GI: soft, bs+, NT, condom cath> off, bed soaked Extremities: warm/dry, no LE edema  Skin: no rashes, ecchymosis to BUE  Afebrile Labs reviewed> BNP 986> 933 UOP 3.1L/ 24hr plus unmeasured output I/Os inaccurate Wts> admit  68.2kg > yest 70.4kg, pending wt today    Resolved Hospital Problem List:  ABX   Vanc 7/16 > 7/16   Cefepime 7/16 - 7/21  Assessment & Plan:  Multifocal PNA, HCAP vs. COVID-associated versuy Amio toxicity COVID-19 infection COPD with acute exacerbation in the setting of PNA Pleural effusion - tx  back to IUC 7/21 on BiPAP, worsening bilateral infiltrates, right effusion P:  - improved after diureses, continue as renal/ hemodynamically tolerated.  I/Os - off BiPAP this am, stable on HHFNC, cont to wean.  Cont BiPAP prn for increased WOB - repeat CXR today and prn  - keep NPO except for meds/ ice chips for now, if remains stable, can slow advance diet as tolerated  - cont monitoring in ICU, high risk for decompensation/ intubation - goal sat  O2 sat 88-92% - likely will be  - ESR neg, PCT flat, CRP 1.9 (barely elevated), doubt amio toxicity - RN to perform bedside swallow screen, r/o any aspiration component  - cont duonebs, adding brovanna/ pulmicort - decrease solumedrol to 80mg  daily  - completed 7 day course cefepime 7/21 - PT/ OT - pulmonary hygiene IS/ flutter  - pending urine legionella  - hydroxyzine prn anxiety - cont contact/ airborne precautions  Chronic HFrEF; has had recovery of ejection fraction to 55 to 60% on most recent Echo Severe aortic valve stenosis - Cardiac monitoring - cont diureses as tolerated  - will need f/u with cardiology   Paroxysmal atrial fibrillation anticoagulated with Coumadin PTA. Failed DCCV January 2024. History of DVT Paroxysmal Afib - cont telemetry monitoring - amio PO - warfarin per pharmacy - cont to optimize electrolytes   Recent critical limb ischemia of right lower extremity with gangrene Extensive peripheral arterial disease - S/p R iliofem EA/popliteal EA 6/25, R TMA 7/2 - Outpatient management per VVS - ASA/statin - Continue Coumadin - PT/OT  Abdominal aortic aneurysm- 5.5 cm on recent imaging, enlarged per prior image - Continue ASA/statin - Outpatient monitoring/mgmt per VVS  CKD stage IIIa, stable - sCr stable, good UOP with diureses   - trend renal indices  - strict I/Os, daily wts - avoid nephrotoxins, renal dose meds, hemodynamic support as above  Hypothyroidism - Continue levothyroxine  Anemia - CBC  stable, trend - transfuse for Hgb < 7  Best practice:  Diet: NPO Pain/Anxiety/Delirium protocol (if indicated): n/a VAP protocol (if indicated): n/a DVT prophylaxis: Coumadin GI prophylaxis: PPI Glucose control: SSI prn  Mobility: bed rest Code Status: full code (Dr Marchelle Gearing cofirmed with patient 11/13/22)  - pending family update 7/22.  Pt updated on plan of care   LABS    PULMONARY Recent Labs  Lab 11/07/22 1414 11/07/22 1415 11/07/22 1730  HCO3 25.5  --  23.1  TCO2 27 25 25   O2SAT 34  --  69    CBC Recent Labs  Lab 11/12/22 0326 11/13/22 0710 11/14/22 0158  HGB 9.1* 10.3* 12.1*  HCT 31.7* 35.4* 42.5  WBC 8.6 9.5 10.1  PLT 191 170 159    COAGULATION Recent Labs  Lab 11/10/22 0352 11/11/22 0815 11/12/22 0326 11/13/22 0710 11/14/22 0158  INR 2.1* 2.2* 2.7* 3.2* 3.0*    CARDIAC  No results for input(s): "TROPONINI" in the last 168 hours. No results for input(s): "PROBNP" in the last 168 hours.   CHEMISTRY Recent Labs  Lab 11/07/22 1407 11/07/22 1414 11/08/22 9629 11/09/22 0549 11/10/22 0352 11/11/22 0815 11/12/22 0326 11/13/22 0710 11/14/22 0158  NA 145   < >  144 145 142 144 143 140 144  K 4.7   < > 4.0 4.4 4.1 4.6 3.5 3.4* 4.7  CL 115*   < > 115* 109 110 108 109 106 107  CO2 25  --  22 27 29 29 29 26 26   GLUCOSE 120*   < > 96 91 138* 118* 127* 145* 99  BUN 28*   < > 25* 28* 24* 26* 27* 30* 31*  CREATININE 1.18   < > 1.13 1.14 1.14 1.03 0.99 0.97 1.02  CALCIUM 8.5*  --  7.8* 8.4* 8.0* 8.4* 8.2* 8.0* 8.5*  MG 2.3  --  2.1 2.2  --   --   --   --   --   PHOS  --   --  3.4 2.6  --   --   --   --   --    < > = values in this interval not displayed.   Estimated Creatinine Clearance: 56.6 mL/min (by C-G formula based on SCr of 1.02 mg/dL).   LIVER Recent Labs  Lab 11/07/22 1407 11/08/22 1008 11/10/22 0352 11/11/22 0815 11/12/22 0326 11/13/22 0710 11/14/22 0158  AST 19  --   --   --   --   --   --   ALT 22  --   --   --   --   --    --   ALKPHOS 61  --   --   --   --   --   --   BILITOT 0.6  --   --   --   --   --   --   PROT 5.9*  --   --   --   --   --   --   ALBUMIN 2.6*  --   --   --   --   --   --   INR 3.6*   < > 2.1* 2.2* 2.7* 3.2* 3.0*   < > = values in this interval not displayed.     INFECTIOUS Recent Labs  Lab 11/07/22 1411 11/07/22 1617 11/07/22 1715 11/10/22 0352 11/13/22 1839  LATICACIDVEN 1.7  --  1.6  --   --   PROCALCITON  --  0.14  --  0.10 0.12     ENDOCRINE CBG (last 3)  Recent Labs    11/13/22 2309 11/14/22 0323 11/14/22 0805  GLUCAP 105* 102* 105*    IMAGING x48h  - image(s) personally visualized  -   highlighted in bold DG CHEST PORT 1 VIEW  Result Date: 11/13/2022 CLINICAL DATA:  Hypoxia EXAM: PORTABLE CHEST 1 VIEW COMPARISON:  11/08/2022 FINDINGS: Cardiac shadow is enlarged but stable. Postsurgical changes are seen. Increasing right-sided pleural effusion is noted. Stable interstitial edema is noted. Worsening density in the right mid and left mid lungs is seen consistent with acute on chronic infiltrate. No bony abnormality is noted. IMPRESSION: Worsening infiltrates bilaterally. Increasing right-sided pleural effusion. Electronically Signed   By: Alcide Clever M.D.   On: 11/13/2022 18:17        CCT 32 mins     Posey Boyer, MSN, NP, AG-ACNP-BC [facility] 11/14/2022, 9:30 AM  See Amion for pager If no response to pager , please call [phone] until 7pm After 7:00 pm call Elink  [phone]

## 2022-11-14 NOTE — IPAL (Signed)
  Interdisciplinary Goals of Care Family Meeting   Date carried out: 11/14/2022  Location of the meeting: Bedside  Member's involved: Physician and Family Member or next of kin  Durable Power of Attorney or acting medical decision maker: Madelaine Etienne  Discussion: We discussed goals of care for [name] .  Patient and his wife wanted to clarify his code status. Patient expressed wishes to be DNR/DNI. He does not want intubated if his breathing worsens and he does not want chest compressions if his heart were to stop. He is ok with non-invasive ventilation. Will update his code status to reflect his wishes.  Code status:   Code Status: DNR   Disposition: Continue current acute care  Time spent for the meeting: 15 minutes    Martina Sinner, MD  11/14/2022, 5:50 PM

## 2022-11-14 NOTE — Progress Notes (Signed)
PT Cancellation Note  Patient Details Name: [name] MRN: [id number] DOB: 09-03-1940   Cancelled Treatment:    Reason Eval/Treat Not Completed: Patient not medically ready;Active bedrest order. Pt transferred back to ICU 7/21. Now on bipap. New order for bedrest. PT to sign off. Will await new order if med status improves.   Ilda Foil 11/14/2022, 9:43 AM

## 2022-11-14 NOTE — Progress Notes (Signed)
ANTICOAGULATION CONSULT NOTE  Pharmacy Consult for warfarin Indication: history DVT  Allergies  Allergen Reactions   Plavix [Clopidogrel Bisulfate]     Brain hemorrhage prev while on plavix and aspirin    Patient Measurements: Height: 5\' 9"  (175.3 cm) Weight: 70.4 kg (155 lb 3.3 oz) IBW/kg (Calculated) : 70.7   Vital Signs: Temp: 97.3 F (36.3 C) (07/22 0819) Temp Source: Axillary (07/22 0819) BP: 171/71 (07/22 0600) Pulse Rate: 67 (07/22 0826)  Labs: Recent Labs    11/12/22 0326 11/13/22 0710 11/14/22 0158  HGB 9.1* 10.3* 12.1*  HCT 31.7* 35.4* 42.5  PLT 191 170 159  LABPROT 28.7* 32.9* 31.0*  INR 2.7* 3.2* 3.0*  CREATININE 0.99 0.97 1.02    Estimated Creatinine Clearance: 56.6 mL/min (by C-G formula based on SCr of 1.02 mg/dL).   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 06/2011 Echo: EF 35-40%; b. 06/2012 Echo: EF 50%; c. 12/2016 Echo: EF 55-60%, no rwma, GRI DD, mild AS/MR, nl RV fxn, nl RVSP; d. 02/2021 Echo: EF 55%, GrII DD. Mild MR. Mild to mod TR. Sev AS by VTI.   AAA (abdominal aortic aneurysm) (HCC)    a. 05/2020 Abd u/s: 3.6cm.   Aortic calcification (HCC) 05/01/2013   Bilateral renal artery stenosis (HCC)    a. 06/2013 Angio/PTA: LRA: 95 (5x18 Herculink stent), RRA 60ost; b. 04/2021 Angio: mod RRA stenosis w/ patent LRA stent.   Carotid arterial disease (HCC)    a. 05/2020 Carotid U/S: RICA 1-39%, LICA 40-59%   CHF (congestive heart failure) (HCC)    Chronic kidney disease    COPD (chronic obstructive pulmonary disease) (HCC)    Coronary artery disease    a. 2013 s/p CABG x 3 (LIMA->LAD, VG->OM, VG->RPDA; b. 06/2012 MV: no ischemia; c. 02/2021 Cath: LM 100, RCA 90ost, 145m, free LIMA->LAD nl, VG->OM2 nl, VG->RPDA nl. Mod AS (mean grad , AVA 1.1cm^2). RHC w/ elev filling pressures.   History of prior cigarette smoking 04/11/2008   Qualifier: Diagnosis of  By: Ermalene Searing  MD, Amy     Hyperlipidemia    Hypertension    Hypothyroidism    Intraventricular hemorrhage (HCC) 07/12/2012   Ischemic cardiomyopathy    a. 06/2011 Echo: EF 35-40%; b. 06/2012 Echo: EF 50%; c. 12/2016 Echo: EF 55-60%, no rwma, GRI DD; d. 02/2021 Echo: Ef 55%, GrII DD.   Moderate aortic stenosis    a. 02/2021 Echo: EF 55%, GrII DD. Sev Ca2+ of AoV. Sev AS w/ AVA by VTI 0.79cm^2; b. 02/2021 Cath: Mod AS w/ mean grad . AVA 1.1cm^2.   PAF (paroxysmal atrial fibrillation) (HCC)    Peripheral arterial disease (HCC)    a. Previous left lower extremity stenting by Dr. Evette Cristal;  b. 12/2012 s/p bilat ostial common iliac stenting; c. 04/2021 Angio: Patent LRA stent, mod RRA stenosis. Small AAA. Sev plaque RSFA 2/ subtl occl of inf branch of profunda. Diff Ca2+ SFA dzs w/ collats from profunda and 3V runoff. Diffuse LSFA dzs w/ collats from profunda. Subtl PT dzs. 3V runoff-->Med Rx.   Right leg DVT (HCC) 08/13/2012   Subclavian artery stenosis, left (HCC) 05/2012   Status post stenting of the ostium and self-expanding stent placement to the left axillary artery   Venous insufficiency       Assessment: 82 yo male with COPD exacerbation, PNA and COVID positive. He is on warfarin PTA for history of DVT (IVC in place).  Pharmacy consulted to dose warfarin.   INR 3.0 - last warfarin dose 7/20  Home warfarin dose: 2.5mg /day except take none on Saturday.  Goal of Therapy:  INR 2-3 Monitor platelets by anticoagulation protocol: Yes   Plan:  No warfarin dose today Resume once INR <3  Eldridge Scot, PharmD Clinical Pharmacist 11/14/2022, 11:11 AM

## 2022-11-14 NOTE — Plan of Care (Signed)
  Problem: Education: Goal: Understanding of CV disease, CV risk reduction, and recovery process will improve Outcome: Progressing Goal: Individualized Educational Video(s) Outcome: Progressing   Problem: Activity: Goal: Ability to return to baseline activity level will improve Outcome: Progressing   Problem: Cardiovascular: Goal: Ability to achieve and maintain adequate cardiovascular perfusion will improve Outcome: Progressing Goal: Vascular access site(s) Level 0-1 will be maintained Outcome: Progressing   Problem: Health Behavior/Discharge Planning: Goal: Ability to safely manage health-related needs after discharge will improve Outcome: Progressing   Problem: Education: Goal: Knowledge of prescribed regimen will improve Outcome: Progressing   Problem: Activity: Goal: Ability to tolerate increased activity will improve Outcome: Progressing   Problem: Bowel/Gastric: Goal: Gastrointestinal status for postoperative course will improve Outcome: Progressing   Problem: Clinical Measurements: Goal: Postoperative complications will be avoided or minimized Outcome: Progressing Goal: Signs and symptoms of graft occlusion will improve Outcome: Progressing   Problem: Skin Integrity: Goal: Demonstration of wound healing without infection will improve Outcome: Progressing   Problem: Education: Goal: Knowledge of General Education information will improve Description: Including pain rating scale, medication(s)/side effects and non-pharmacologic comfort measures Outcome: Progressing   Problem: Health Behavior/Discharge Planning: Goal: Ability to manage health-related needs will improve Outcome: Progressing   Problem: Clinical Measurements: Goal: Ability to maintain clinical measurements within normal limits will improve Outcome: Progressing Goal: Will remain free from infection Outcome: Progressing Goal: Diagnostic test results will improve Outcome: Progressing Goal:  Respiratory complications will improve Outcome: Progressing Goal: Cardiovascular complication will be avoided Outcome: Progressing   Problem: Activity: Goal: Risk for activity intolerance will decrease Outcome: Progressing   Problem: Nutrition: Goal: Adequate nutrition will be maintained Outcome: Progressing   Problem: Coping: Goal: Level of anxiety will decrease Outcome: Progressing   Problem: Elimination: Goal: Will not experience complications related to bowel motility Outcome: Progressing Goal: Will not experience complications related to urinary retention Outcome: Progressing   Problem: Pain Managment: Goal: General experience of comfort will improve Outcome: Progressing   Problem: Safety: Goal: Ability to remain free from injury will improve Outcome: Progressing   Problem: Skin Integrity: Goal: Risk for impaired skin integrity will decrease Outcome: Progressing   Problem: Education: Goal: Knowledge of risk factors and measures for prevention of condition will improve Outcome: Progressing   Problem: Coping: Goal: Psychosocial and spiritual needs will be supported Outcome: Progressing   Problem: Respiratory: Goal: Will maintain a patent airway Outcome: Progressing Goal: Complications related to the disease process, condition or treatment will be avoided or minimized Outcome: Progressing   Problem: Education: Goal: Ability to describe self-care measures that may prevent or decrease complications (Diabetes Survival Skills Education) will improve Outcome: Progressing Goal: Individualized Educational Video(s) Outcome: Progressing   Problem: Coping: Goal: Ability to adjust to condition or change in health will improve Outcome: Progressing   Problem: Fluid Volume: Goal: Ability to maintain a balanced intake and output will improve Outcome: Progressing   Problem: Health Behavior/Discharge Planning: Goal: Ability to identify and utilize available resources  and services will improve Outcome: Progressing Goal: Ability to manage health-related needs will improve Outcome: Progressing   Problem: Metabolic: Goal: Ability to maintain appropriate glucose levels will improve Outcome: Progressing   Problem: Nutritional: Goal: Maintenance of adequate nutrition will improve Outcome: Progressing Goal: Progress toward achieving an optimal weight will improve Outcome: Progressing   Problem: Skin Integrity: Goal: Risk for impaired skin integrity will decrease Outcome: Progressing   Problem: Tissue Perfusion: Goal: Adequacy of tissue perfusion will improve Outcome: Progressing

## 2022-11-15 DIAGNOSIS — J9621 Acute and chronic respiratory failure with hypoxia: Secondary | ICD-10-CM

## 2022-11-15 LAB — PROTIME-INR
INR: 3 — ABNORMAL HIGH (ref 0.8–1.2)
Prothrombin Time: 31.7 seconds — ABNORMAL HIGH (ref 11.4–15.2)

## 2022-11-15 LAB — RENAL FUNCTION PANEL
Albumin: 2.1 g/dL — ABNORMAL LOW (ref 3.5–5.0)
Anion gap: 13 (ref 5–15)
BUN: 34 mg/dL — ABNORMAL HIGH (ref 8–23)
CO2: 31 mmol/L (ref 22–32)
Calcium: 7.9 mg/dL — ABNORMAL LOW (ref 8.9–10.3)
Chloride: 101 mmol/L (ref 98–111)
Creatinine, Ser: 1.16 mg/dL (ref 0.61–1.24)
GFR, Estimated: >60 mL/min (ref >60)
Glucose, Bld: 113 mg/dL — ABNORMAL HIGH (ref 70–99)
Phosphorus: 3.3 mg/dL (ref 2.5–4.6)
Potassium: 3.1 mmol/L — ABNORMAL LOW (ref 3.5–5.1)
Sodium: 145 mmol/L (ref 135–145)

## 2022-11-15 LAB — MAGNESIUM: Magnesium: 2.1 mg/dL (ref 1.7–2.4)

## 2022-11-15 LAB — CBC
HCT: 36.4 % — ABNORMAL LOW (ref 39.0–52.0)
Hemoglobin: 10.7 g/dL — ABNORMAL LOW (ref 13.0–17.0)
MCH: 27.4 pg (ref 26.0–34.0)
MCHC: 29.4 g/dL — ABNORMAL LOW (ref 30.0–36.0)
MCV: 93.1 fL (ref 80.0–100.0)
Platelets: 123 10*3/uL — ABNORMAL LOW (ref 150–400)
RBC: 3.91 MIL/uL — ABNORMAL LOW (ref 4.22–5.81)
RDW: 21.1 % — ABNORMAL HIGH (ref 11.5–15.5)
WBC: 9.7 10*3/uL (ref 4.0–10.5)
nRBC: 0 % (ref 0.0–0.2)

## 2022-11-15 LAB — GLUCOSE, CAPILLARY
Glucose-Capillary: 118 mg/dL — ABNORMAL HIGH (ref 70–99)
Glucose-Capillary: 100 mg/dL — ABNORMAL HIGH (ref 70–99)
Glucose-Capillary: 134 mg/dL — ABNORMAL HIGH (ref 70–99)
Glucose-Capillary: 138 mg/dL — ABNORMAL HIGH (ref 70–99)
Glucose-Capillary: 131 mg/dL — ABNORMAL HIGH (ref 70–99)

## 2022-11-15 MED ORDER — POTASSIUM CHLORIDE 10 MEQ/100ML IV SOLN
10.0000 meq | INTRAVENOUS | Status: AC
  Administered 2022-11-15 (×4): 10 meq via INTRAVENOUS
  Filled 2022-11-15 (×4): qty 100

## 2022-11-15 MED ORDER — METHYLPREDNISOLONE SODIUM SUCC 125 MG IJ SOLR
60.0000 mg | Freq: Every day | INTRAMUSCULAR | Status: DC
  Administered 2022-11-16: 60 mg via INTRAVENOUS
  Filled 2022-11-15: qty 2

## 2022-11-15 MED ORDER — POTASSIUM CHLORIDE CRYS ER 20 MEQ PO TBCR
20.0000 meq | EXTENDED_RELEASE_TABLET | ORAL | Status: AC
  Administered 2022-11-15 (×2): 20 meq via ORAL
  Filled 2022-11-15 (×2): qty 1

## 2022-11-15 MED ORDER — IPRATROPIUM-ALBUTEROL 0.5-2.5 (3) MG/3ML IN SOLN
3.0000 mL | Freq: Two times a day (BID) | RESPIRATORY_TRACT | Status: DC
  Administered 2022-11-16: 3 mL via RESPIRATORY_TRACT
  Filled 2022-11-15: qty 3

## 2022-11-15 NOTE — Progress Notes (Signed)
NAME:  [name], MRN:  [id number], DOB:  1940/12/08, LOS: 8 ADMISSION DATE:  11/07/2022, CONSULTATION DATE:  11/07/2022 REFERRING MD:  Eloise Harman - EDP, CHIEF COMPLAINT: PNA, COVID positive  BRIEF  82 year old man who presented to [facility] ED 7/15 for SOB. PMHx significant for HTN, HLD, CAD (s/p CABG), PAF with history of DVT (s/p IVC filter, on Coumadin), HFrEF (Echo 6/28 with EF 55-60%), COPD, AAA, bilateral RAS, CKD stage IIIb, subclavian artery stenosis and severe PAD (s/p R iliofemoral endarterectomy, popliteal endarterectomy and R TMA with [facility] 6/25, 7/2). Recently admitted to [facility] 6/18 - 7/8 for sepsis due to RLE infection (s/p surgical intervention), discharged to rehab [facility]).  History is obtained primarily from patient's wife/from chart review. Per patient's wife, discharged from [facility] 7/8 to [facility] for rehab postoperatively. EMS called for SOB/respiratory distress; per report SpO2 73% on RA, improved to 100% with NRB mask. Per facility records, patient had been on ceftriaxone/clindamycin x 5 days for PNA treatment.  On ED arrival, patient temp was 99.17F with HR 80, RR 23, BP 117/93, SpO2 100% on NRB, transitioned to BiPAP. Labs were notable for WBC 11.9, Hgb 9.7, Plt 305. INR 3.6 (on Coumadin). Na 145, K 4.7, CO2 25, Cr 1.18 (baseline); LFTs WNL. BNP 997 (585), trop 76 > 89, suspect demand-related. COVID positive. CXR demonstrated worsening RML/RLL PNA with R pleural effusion. Broad-spectrum antibiotic coverage initiated.  PCCM consulted for ICU admission.  Pertinent Medical History:  HFrEF, AAA, bilateral renal artery stenosis, CKD stage IIIb, COPD, CAD, HTN, HLD, PAF with history of DVT anticoagulated with Coumadin and IVC filter in place, PAD, and, subclavian artery stenosis   Significant Hospital Events: Including procedures, antibiotic start and stop dates in addition to other pertinent events   7/15 - Presented to ED with SOB/resp distress. Placed on BiPAP. CXR with  multifocal PNA, primarily RML/RLL, R pleural effusion. Broad-spectrum antibiotics started. PCCM consulted for admission. 7/16 of BIPAP to [place] -0 Doing well on BiPAP. Transitioned to 4L [place] and tolerating well. Hypoglycemic borderline this morning. Diuresed well with lasix, remains positive for the admission.  7/17 to [facility] 7/21 called back/ moved to ICU, worsening CXR, hypoxia, BiPAP 7/22 ongoing diureses, improving O2 requirements, BiPAP prn, HHFNC 40L, 50%, code status changed to DNR/ DNI per pt   Interim History / Subjective:  Did not wear BiPAP overnight, on HHFNC 40L/ 50%, but fatigued so back on BiPAP this am.   Objective:  Blood pressure 137/64, pulse 71, temperature (!) 97.2 F (36.2 C), temperature source Axillary, resp. rate (!) 22, height 5\' 9"  (1.753 m), weight 66.1 kg, SpO2 94%.    FiO2 (%):  [50 %] 50 %   Intake/Output Summary (Last 24 hours) at 11/15/2022 0849 Last data filed at 11/15/2022 0600 Gross per 24 hour  Intake 299.99 ml  Output 3450 ml  Net -3150.01 ml   Filed Weights   11/13/22 0404 11/13/22 0500 11/15/22 0600  Weight: 72.8 kg 70.4 kg 66.1 kg   Physical Examination: General:  chronically ill, thin, and frail elderly male sitting upright in bed watching TV, appears fatigued  HEENT: full face bipap mask, pupils 3/r Neuro: Awake, appropriate, MAE- generalized weakness CV: rr, distant PULM:  tachypneic upper 20's on BiPAP 16/8 with TVs 500s, decreased to 40% GI: soft, bs+, NT, condom cath Extremities: warm/dry, no LE edema, RLE incision cdi, staples intact to metatarsal amputation/ cdi Skin: BUE ecchymosis    Afebrile Labs reviewed> K 3.1, sCr 1.02> 1.16, BUN  31> 34, Hgb 10.3> 12.1> 10.7, plts 159> 123 UOP 3.4L/ 24hr plus unmeasured output I/Os inaccurate Wts> admit 68.2kg.  wt 70.4> 66.1kg     Resolved Hospital Problem List:  ABX   Vanc 7/16 > 7/16   Cefepime 7/16 - 7/21  Assessment & Plan:  Multifocal PNA, HCAP vs. COVID-associated PNA, pulmonary  edema with pleural effusion COVID-19 infection COPD with acute exacerbation in the setting of PNA - tx back to [facility] 7/21 on BiPAP, worsening bilateral infiltrates, right effusion.  Back to [facility] 7/22 w/ prn BiPAP - ESR neg, PCT flat, CRP 1.9 (barely elevated), doubt amio toxicity - passed bedside swallow 7/22 P:  - continued improvement in FiO2 needs with diureses.  Hold today, reassess daily.  - encouraged him to wear BiPAP at night, with naps, and prn for WOB. Did not wear overnight, now more fatigued.  Otherwise, weaning HHFNC, 40L/ 40-50% - goal sat 88-94% - prn CXR - cont solumedrol taper, decrease to 60mg  7/24 - cont BD scheduled/ prn - aggressive pulmonary hygiene- OOB w/ PT, IS, flutter - advance diet as tolerated when off BiPAP - monitor off abx, remains afebrile with slow clinical improvement,   - DNR/ DNI as of 7/22.  If still stable, will likely transfer out of ICU later this afternoon to PCU - hydroxyzine prn anxiety - cont contact/ airborne precautions  Chronic HFrEF; has had recovery of ejection fraction to 55 to 60% on most recent Echo Severe aortic valve stenosis - Cardiac monitoring - hold diureses today, assess daily  - will need f/u with cardiology   Paroxysmal atrial fibrillation anticoagulated with Coumadin PTA. Failed DCCV January 2024. History of DVT Paroxysmal Afib - cont telemetry monitoring - cont amio PO - warfarin per pharmacy - cont to optimize electrolytes, K > 4, Mag > 2  Recent critical limb ischemia of right lower extremity with gangrene Extensive peripheral arterial disease - S/p R iliofem EA/popliteal EA 6/25, R TMA 7/2 - Outpatient management per VVS - site remains wnl - cont ASA/statin - cont Coumadin per pharmacy  - PT/OT  Abdominal aortic aneurysm- 5.5 cm on recent imaging, enlarged per prior image - Continue ASA/statin - Outpatient monitoring/mgmt per [facility]  CKD stage IIIa, stable - sCr 1.02> 1.16, good UOP with diureses yesterday.   Wts down.  - cont condom cath - trend renal indices  - strict I/Os, daily wts - avoid nephrotoxins, renal dose meds, hemodynamic support as above  Hypothyroidism - cont levothyroxine  Anemia Thrombocytopenia - 7/23 - ptls trending down> monitor.  H/H stable, no signs of bleeding - transfuse for Hgb < 7  Deconditioning Protein calorie malnutrition - PT/ OT - maximize nutrition  Best practice:  Diet: advance as tolerated  Pain/Anxiety/Delirium protocol (if indicated): n/a VAP protocol (if indicated): n/a DVT prophylaxis: Coumadin GI prophylaxis: PPI Glucose control: SSI prn  Mobility: advance  Code Status: full code (Dr Marchelle Gearing cofirmed with patient 11/13/22) - pt changed to DNR/ DNI per pt/ wife request - pending family update 7/23.  Pt updated on plan of care   LABS    PULMONARY No results for input(s): "PHART", "PCO2ART", "PO2ART", "HCO3", "TCO2", "O2SAT" in the last 168 hours.  Invalid input(s): "PCO2", "PO2"   CBC Recent Labs  Lab 11/13/22 0710 11/14/22 0158 11/15/22 0101  HGB 10.3* 12.1* 10.7*  HCT 35.4* 42.5 36.4*  WBC 9.5 10.1 9.7  PLT 170 159 123*    COAGULATION Recent Labs  Lab 11/11/22 0815 11/12/22 0326 11/13/22 0710 11/14/22  0158 11/15/22 0101  INR 2.2* 2.7* 3.2* 3.0* 3.0*    CARDIAC  No results for input(s): "TROPONINI" in the last 168 hours. No results for input(s): "PROBNP" in the last 168 hours.   CHEMISTRY Recent Labs  Lab 11/09/22 0549 11/10/22 0352 11/11/22 0815 11/12/22 0326 11/13/22 0710 11/14/22 0158 11/15/22 0101  NA 145   < > 144 143 140 144 145  K 4.4   < > 4.6 3.5 3.4* 4.7 3.1*  CL 109   < > 108 109 106 107 101  CO2 27   < > 29 29 26 26 31   GLUCOSE 91   < > 118* 127* 145* 99 113*  BUN 28*   < > 26* 27* 30* 31* 34*  CREATININE 1.14   < > 1.03 0.99 0.97 1.02 1.16  CALCIUM 8.4*   < > 8.4* 8.2* 8.0* 8.5* 7.9*  MG 2.2  --   --   --   --   --  2.1  PHOS 2.6  --   --   --   --   --  3.3   < > = values in this  interval not displayed.   Estimated Creatinine Clearance: 46.7 mL/min (by C-G formula based on SCr of 1.16 mg/dL).   LIVER Recent Labs  Lab 11/11/22 0815 11/12/22 0326 11/13/22 0710 11/14/22 0158 11/15/22 0101  ALBUMIN  --   --   --   --  2.1*  INR 2.2* 2.7* 3.2* 3.0* 3.0*     INFECTIOUS Recent Labs  Lab 11/10/22 0352 11/13/22 1839  PROCALCITON 0.10 0.12     ENDOCRINE CBG (last 3)  Recent Labs    11/14/22 2317 11/15/22 0312 11/15/22 0754  GLUCAP 110* 118* 100*    IMAGING x48h  - image(s) personally visualized  -   highlighted in bold DG Chest Port 1 View  Result Date: 11/14/2022 CLINICAL DATA:  Shortness of breath. EXAM: PORTABLE CHEST 1 VIEW COMPARISON:  November 13, 2022. FINDINGS: Stable cardiomediastinal silhouette. Status post coronary bypass graft. Stable bilateral diffuse lung opacities are noted, right greater than left. Bilateral pleural effusions are also noted, right greater than left. Bony thorax is unremarkable. IMPRESSION: Stable bilateral lung opacities and associated effusions. Electronically Signed   By: Lupita Raider M.D.   On: 11/14/2022 11:47   DG CHEST PORT 1 VIEW  Result Date: 11/13/2022 CLINICAL DATA:  Hypoxia EXAM: PORTABLE CHEST 1 VIEW COMPARISON:  11/08/2022 FINDINGS: Cardiac shadow is enlarged but stable. Postsurgical changes are seen. Increasing right-sided pleural effusion is noted. Stable interstitial edema is noted. Worsening density in the right mid and left mid lungs is seen consistent with acute on chronic infiltrate. No bony abnormality is noted. IMPRESSION: Worsening infiltrates bilaterally. Increasing right-sided pleural effusion. Electronically Signed   By: Alcide Clever M.D.   On: 11/13/2022 18:17        Posey Boyer, MSN, NP, AG-ACNP-BC [facility] 11/15/2022, 8:49 AM  See Amion for pager If no response to pager , please call [phone] until 7pm After 7:00 pm call Elink  [phone]

## 2022-11-15 NOTE — Progress Notes (Signed)
ANTICOAGULATION CONSULT NOTE  Pharmacy Consult for warfarin Indication: history DVT  Allergies  Allergen Reactions   Plavix [Clopidogrel Bisulfate]     Brain hemorrhage prev while on plavix and aspirin    Patient Measurements: Height: 5\' 9"  (175.3 cm) Weight: 66.1 kg (145 lb 11.6 oz) IBW/kg (Calculated) : 70.7   Vital Signs: Temp: 97.2 F (36.2 C) (07/23 0750) Temp Source: Axillary (07/23 0750) BP: 117/56 (07/23 0900) Pulse Rate: 72 (07/23 0900)  Labs: Recent Labs    11/13/22 0710 11/14/22 0158 11/15/22 0101  HGB 10.3* 12.1* 10.7*  HCT 35.4* 42.5 36.4*  PLT 170 159 123*  LABPROT 32.9* 31.0* 31.7*  INR 3.2* 3.0* 3.0*  CREATININE 0.97 1.02 1.16    Estimated Creatinine Clearance: 46.7 mL/min (by C-G formula based on SCr of 1.16 mg/dL).  Assessment: 82 yo male with COPD exacerbation, PNA and COVID positive. He is on warfarin PTA for history of DVT (IVC in place). Pharmacy consulted to dose warfarin.   INR remains at 3.0 and PT is up today 31.7 - last warfarin dose 7/20. Platelets are trending down at 123- also on ASA.   Home warfarin dose: 2.5mg /day except take none on Saturday.  Goal of Therapy:  INR 2-3 Monitor platelets by anticoagulation protocol: Yes   Plan:  No warfarin dose today - discussed with CCM team.  Resume once INR <3  Link Snuffer, PharmD, BCPS, BCCCP Please refer to [facility] for [facility] Pharmacy numbers 11/15/2022, 10:05 AM

## 2022-11-15 NOTE — TOC Progression Note (Signed)
Transition of Care [facility]) - Initial/Assessment Note    Patient Details  Name: [name] MRN: [id number] Date of Birth: 01/25/1941  [facility]) CM/SW Contact:    Ralene Bathe, LCSW Phone Number: 11/15/2022, 2:48 PM  Clinical Narrative:                 LCSW received consult for [facility] and presented information and choice to the patient's spouse.  The spouse plans to speak with her children and the patient and will inform LCSW of family's decision tomorrow.  TOC following.   Expected Discharge Plan: Skilled Nursing Facility Barriers to Discharge: Continued Medical Work up   Patient Goals and CMS Choice            Expected Discharge Plan and Services In-house Referral: Clinical Social Work   Post Acute Care Choice: Skilled Nursing Facility Living arrangements for the past 2 months: Single Family Home                                      Prior Living Arrangements/Services Living arrangements for the past 2 months: Single Family Home Lives with:: Spouse Patient language and need for interpreter reviewed:: Yes        Need for Family Participation in Patient Care: Yes (Comment) Care giver support system in place?: Yes (comment)   Criminal Activity/Legal Involvement Pertinent to Current Situation/Hospitalization: No - Comment as needed  Activities of Daily Living Home Assistive Devices/Equipment: Walker (specify type) ADL Screening (condition at time of admission) Patient's cognitive ability adequate to safely complete daily activities?: Yes Is the patient deaf or have difficulty hearing?: No Does the patient have difficulty seeing, even when wearing glasses/contacts?: No Does the patient have difficulty concentrating, remembering, or making decisions?: Yes Patient able to express need for assistance with ADLs?: No Does the patient have difficulty dressing or bathing?: Yes Independently performs ADLs?: Yes (appropriate for developmental  age) Does the patient have difficulty walking or climbing stairs?: Yes Weakness of Legs: Both Weakness of Arms/Hands: Both  Permission Sought/Granted                  Emotional Assessment Appearance:: Appears older than stated age     Orientation: : Oriented to Self, Oriented to Place, Oriented to  Time, Oriented to Situation Alcohol / Substance Use: Not Applicable Psych Involvement: No (comment)  Admission diagnosis:  COPD exacerbation (HCC) [J44.1] Acute on chronic respiratory failure with hypoxia and hypercapnia (HCC) [J96.21, J96.22] Multifocal pneumonia [J18.9] Pneumonia of right lung due to infectious organism, unspecified part of lung [J18.9] Patient Active Problem List   Diagnosis Date Noted   Acute on chronic respiratory failure with hypoxia and hypercapnia (HCC) 11/15/2022   Pressure injury of skin 11/13/2022   Multifocal pneumonia 11/07/2022   Critical limb ischemia of right lower extremity with ulceration of foot (HCC) 10/11/2022   Limb ischemia 10/11/2022   Heart failure with preserved ejection fraction (HCC) 03/23/2022   History of deep vein thrombosis (DVT) of lower extremity 03/23/2022   ARDS (adult respiratory distress syndrome) (HCC) 02/09/2022   Pneumothorax 02/06/2022   Venous stasis ulcer of left ankle limited to breakdown of skin with varicose veins (HCC) 09/22/2021   Aortic valve stenosis    Long term (current) use of anticoagulants 01/27/2017   DVT (deep venous thrombosis) (HCC) 12/29/2016   PAF (paroxysmal atrial fibrillation) (HCC) 05/24/2013   Aortic calcification (HCC)  05/01/2013   CKD (chronic kidney disease) stage 3, GFR 30-59 ml/min (HCC) 05/01/2013   Bilateral renal artery stenosis (HCC)    HTN (hypertension) 07/15/2012   Intraventricular hemorrhage (HCC) 07/12/2012   Peripheral arterial disease (HCC)    Ischemic cardiomyopathy 07/08/2011   CAD (coronary artery disease), native coronary artery 07/07/2011   Subclavian arterial stenosis  (HCC) 04/25/2011   BPH (benign prostatic hyperplasia) 12/29/2010   COPD (chronic obstructive pulmonary disease) (HCC) 01/08/2010   Abdominal aortic aneurysm (HCC) 11/06/2009   HYPERCHOLESTEROLEMIA 10/09/2009   History of prior cigarette smoking 04/11/2008   Hypothyroidism 09/07/2006   PCP:  [name], MD Pharmacy:   [facility] - [place], [place] - [address] [place] [address] Phone: [phone] Fax: [phone]  [facility] [address] [place] - [address] [place] [address] Phone: [phone] Fax: [phone]     Social Determinants of Health (SDOH) Social History: SDOH Screenings   Food Insecurity: No Food Insecurity (11/09/2022)  Housing: Low Risk  (11/09/2022)  Transportation Needs: No Transportation Needs (11/09/2022)  Utilities: Not At Risk (11/09/2022)  Alcohol Screen: Low Risk  (07/13/2022)  Depression (PHQ2-9): Low Risk  (07/13/2022)  Financial Resource Strain: Low Risk  (07/13/2022)  Physical Activity: Insufficiently Active (07/13/2022)  Social Connections: Moderately Integrated (07/13/2022)  Stress: No Stress Concern Present (07/13/2022)  Tobacco Use: Medium Risk (11/07/2022)   SDOH Interventions:     Readmission Risk Interventions    11/10/2022   12:01 PM  Readmission Risk Prevention Plan  Transportation Screening Complete  Medication Review (RN Care Manager) Complete  PCP or Specialist appointment within 3-5 days of discharge Complete  HRI or Home Care Consult Complete  SW Recovery Care/Counseling Consult Complete  Palliative Care Screening Not Applicable  Skilled Nursing Facility Complete

## 2022-11-15 NOTE — Progress Notes (Signed)
[facility] ADULT ICU REPLACEMENT PROTOCOL   The patient does apply for the [facility] Adult ICU Electrolyte Replacment Protocol based on the criteria listed below:   1.Exclusion criteria: TCTS, ECMO, Dialysis, and Myasthenia Gravis patients 2. Is GFR >/= 30 ml/min? Yes.    Patient's GFR today is >60 3. Is SCr </= 2? Yes.   Patient's SCr is 1.16 mg/dL 4. Did SCr increase >/= 0.5 in 24 hours? No. 5.Pt's weight >40kg  Yes.   6. Abnormal electrolyte(s): K+ 3.1  7. Electrolytes replaced per protocol 8.  Call MD STAT for K+ </= 2.5, Phos </= 1, or Mag </= 1 Physician:  Larinda Buttery [facility] 11/15/2022 1:59 AM

## 2022-11-15 NOTE — Plan of Care (Signed)
  Problem: Education: Goal: Understanding of CV disease, CV risk reduction, and recovery process will improve Outcome: Progressing Goal: Individualized Educational Video(s) Outcome: Progressing   Problem: Activity: Goal: Ability to return to baseline activity level will improve Outcome: Progressing   Problem: Cardiovascular: Goal: Ability to achieve and maintain adequate cardiovascular perfusion will improve Outcome: Progressing Goal: Vascular access site(s) Level 0-1 will be maintained Outcome: Progressing   Problem: Health Behavior/Discharge Planning: Goal: Ability to safely manage health-related needs after discharge will improve Outcome: Progressing   Problem: Education: Goal: Knowledge of prescribed regimen will improve Outcome: Progressing   Problem: Activity: Goal: Ability to tolerate increased activity will improve Outcome: Progressing   Problem: Bowel/Gastric: Goal: Gastrointestinal status for postoperative course will improve Outcome: Progressing   Problem: Clinical Measurements: Goal: Postoperative complications will be avoided or minimized Outcome: Progressing Goal: Signs and symptoms of graft occlusion will improve Outcome: Progressing   Problem: Skin Integrity: Goal: Demonstration of wound healing without infection will improve Outcome: Progressing   Problem: Education: Goal: Knowledge of General Education information will improve Description: Including pain rating scale, medication(s)/side effects and non-pharmacologic comfort measures Outcome: Progressing   Problem: Health Behavior/Discharge Planning: Goal: Ability to manage health-related needs will improve Outcome: Progressing   Problem: Clinical Measurements: Goal: Ability to maintain clinical measurements within normal limits will improve Outcome: Progressing Goal: Will remain free from infection Outcome: Progressing Goal: Diagnostic test results will improve Outcome: Progressing Goal:  Respiratory complications will improve Outcome: Progressing Goal: Cardiovascular complication will be avoided Outcome: Progressing   Problem: Activity: Goal: Risk for activity intolerance will decrease Outcome: Progressing   Problem: Nutrition: Goal: Adequate nutrition will be maintained Outcome: Progressing   Problem: Coping: Goal: Level of anxiety will decrease Outcome: Progressing   Problem: Elimination: Goal: Will not experience complications related to bowel motility Outcome: Progressing Goal: Will not experience complications related to urinary retention Outcome: Progressing   Problem: Pain Managment: Goal: General experience of comfort will improve Outcome: Progressing   Problem: Safety: Goal: Ability to remain free from injury will improve Outcome: Progressing   Problem: Skin Integrity: Goal: Risk for impaired skin integrity will decrease Outcome: Progressing   Problem: Education: Goal: Knowledge of risk factors and measures for prevention of condition will improve Outcome: Progressing   Problem: Coping: Goal: Psychosocial and spiritual needs will be supported Outcome: Progressing   Problem: Respiratory: Goal: Will maintain a patent airway Outcome: Progressing Goal: Complications related to the disease process, condition or treatment will be avoided or minimized Outcome: Progressing   Problem: Education: Goal: Ability to describe self-care measures that may prevent or decrease complications (Diabetes Survival Skills Education) will improve Outcome: Progressing Goal: Individualized Educational Video(s) Outcome: Progressing   Problem: Coping: Goal: Ability to adjust to condition or change in health will improve Outcome: Progressing   Problem: Fluid Volume: Goal: Ability to maintain a balanced intake and output will improve Outcome: Progressing   Problem: Health Behavior/Discharge Planning: Goal: Ability to identify and utilize available resources  and services will improve Outcome: Progressing Goal: Ability to manage health-related needs will improve Outcome: Progressing   Problem: Metabolic: Goal: Ability to maintain appropriate glucose levels will improve Outcome: Progressing   Problem: Nutritional: Goal: Maintenance of adequate nutrition will improve Outcome: Progressing Goal: Progress toward achieving an optimal weight will improve Outcome: Progressing   Problem: Skin Integrity: Goal: Risk for impaired skin integrity will decrease Outcome: Progressing   Problem: Tissue Perfusion: Goal: Adequacy of tissue perfusion will improve Outcome: Progressing

## 2022-11-16 ENCOUNTER — Inpatient Hospital Stay
Admit: 2022-11-16 | Discharge: 2022-12-02 | Disposition: A | Discharge status: Skilled Nursing Facility | Payer: Medicare Other | Source: Ambulatory Visit

## 2022-11-16 DIAGNOSIS — Z741 Need for assistance with personal care: Secondary | ICD-10-CM | POA: Diagnosis not present

## 2022-11-16 DIAGNOSIS — L89816 Pressure-induced deep tissue damage of head: Secondary | ICD-10-CM | POA: Diagnosis not present

## 2022-11-16 DIAGNOSIS — I480 Paroxysmal atrial fibrillation: Secondary | ICD-10-CM | POA: Diagnosis not present

## 2022-11-16 DIAGNOSIS — J9621 Acute and chronic respiratory failure with hypoxia: Secondary | ICD-10-CM | POA: Diagnosis not present

## 2022-11-16 DIAGNOSIS — J99 Respiratory disorders in diseases classified elsewhere: Secondary | ICD-10-CM | POA: Diagnosis not present

## 2022-11-16 DIAGNOSIS — R1311 Dysphagia, oral phase: Secondary | ICD-10-CM | POA: Diagnosis not present

## 2022-11-16 DIAGNOSIS — E46 Unspecified protein-calorie malnutrition: Secondary | ICD-10-CM | POA: Diagnosis not present

## 2022-11-16 DIAGNOSIS — J9600 Acute respiratory failure, unspecified whether with hypoxia or hypercapnia: Secondary | ICD-10-CM | POA: Diagnosis not present

## 2022-11-16 DIAGNOSIS — Z66 Do not resuscitate: Secondary | ICD-10-CM | POA: Diagnosis not present

## 2022-11-16 DIAGNOSIS — N1831 Chronic kidney disease, stage 3a: Secondary | ICD-10-CM | POA: Diagnosis not present

## 2022-11-16 DIAGNOSIS — M6259 Muscle wasting and atrophy, not elsewhere classified, multiple sites: Secondary | ICD-10-CM | POA: Diagnosis not present

## 2022-11-16 DIAGNOSIS — I739 Peripheral vascular disease, unspecified: Secondary | ICD-10-CM | POA: Diagnosis not present

## 2022-11-16 DIAGNOSIS — R498 Other voice and resonance disorders: Secondary | ICD-10-CM | POA: Diagnosis not present

## 2022-11-16 DIAGNOSIS — J9620 Acute and chronic respiratory failure, unspecified whether with hypoxia or hypercapnia: Secondary | ICD-10-CM | POA: Diagnosis not present

## 2022-11-16 DIAGNOSIS — I255 Ischemic cardiomyopathy: Secondary | ICD-10-CM | POA: Diagnosis not present

## 2022-11-16 DIAGNOSIS — Z7901 Long term (current) use of anticoagulants: Secondary | ICD-10-CM | POA: Diagnosis not present

## 2022-11-16 DIAGNOSIS — J189 Pneumonia, unspecified organism: Secondary | ICD-10-CM | POA: Diagnosis not present

## 2022-11-16 DIAGNOSIS — Z951 Presence of aortocoronary bypass graft: Secondary | ICD-10-CM | POA: Diagnosis not present

## 2022-11-16 DIAGNOSIS — R531 Weakness: Secondary | ICD-10-CM | POA: Diagnosis not present

## 2022-11-16 DIAGNOSIS — I714 Abdominal aortic aneurysm, without rupture, unspecified: Secondary | ICD-10-CM | POA: Diagnosis not present

## 2022-11-16 DIAGNOSIS — J449 Chronic obstructive pulmonary disease, unspecified: Secondary | ICD-10-CM | POA: Diagnosis not present

## 2022-11-16 DIAGNOSIS — Z681 Body mass index (BMI) 19 or less, adult: Secondary | ICD-10-CM | POA: Diagnosis not present

## 2022-11-16 DIAGNOSIS — I5032 Chronic diastolic (congestive) heart failure: Secondary | ICD-10-CM | POA: Diagnosis not present

## 2022-11-16 DIAGNOSIS — L89610 Pressure ulcer of right heel, unstageable: Secondary | ICD-10-CM | POA: Diagnosis not present

## 2022-11-16 DIAGNOSIS — J9601 Acute respiratory failure with hypoxia: Secondary | ICD-10-CM | POA: Diagnosis not present

## 2022-11-16 DIAGNOSIS — R278 Other lack of coordination: Secondary | ICD-10-CM | POA: Diagnosis not present

## 2022-11-16 DIAGNOSIS — L89620 Pressure ulcer of left heel, unstageable: Secondary | ICD-10-CM | POA: Diagnosis not present

## 2022-11-16 DIAGNOSIS — I998 Other disorder of circulatory system: Secondary | ICD-10-CM | POA: Diagnosis not present

## 2022-11-16 DIAGNOSIS — M6281 Muscle weakness (generalized): Secondary | ICD-10-CM | POA: Diagnosis not present

## 2022-11-16 DIAGNOSIS — J90 Pleural effusion, not elsewhere classified: Secondary | ICD-10-CM | POA: Diagnosis not present

## 2022-11-16 DIAGNOSIS — J939 Pneumothorax, unspecified: Secondary | ICD-10-CM | POA: Diagnosis not present

## 2022-11-16 DIAGNOSIS — Z743 Need for continuous supervision: Secondary | ICD-10-CM | POA: Diagnosis not present

## 2022-11-16 DIAGNOSIS — I130 Hypertensive heart and chronic kidney disease with heart failure and stage 1 through stage 4 chronic kidney disease, or unspecified chronic kidney disease: Secondary | ICD-10-CM | POA: Diagnosis not present

## 2022-11-16 DIAGNOSIS — I70235 Atherosclerosis of native arteries of right leg with ulceration of other part of foot: Secondary | ICD-10-CM | POA: Diagnosis not present

## 2022-11-16 DIAGNOSIS — L89150 Pressure ulcer of sacral region, unstageable: Secondary | ICD-10-CM | POA: Diagnosis not present

## 2022-11-16 DIAGNOSIS — Z87891 Personal history of nicotine dependence: Secondary | ICD-10-CM | POA: Diagnosis not present

## 2022-11-16 DIAGNOSIS — I2510 Atherosclerotic heart disease of native coronary artery without angina pectoris: Secondary | ICD-10-CM | POA: Diagnosis not present

## 2022-11-16 DIAGNOSIS — J439 Emphysema, unspecified: Secondary | ICD-10-CM | POA: Diagnosis not present

## 2022-11-16 DIAGNOSIS — E039 Hypothyroidism, unspecified: Secondary | ICD-10-CM | POA: Diagnosis not present

## 2022-11-16 LAB — RENAL FUNCTION PANEL
Albumin: 2.1 g/dL — ABNORMAL LOW (ref 3.5–5.0)
Anion gap: 14 (ref 5–15)
BUN: 30 mg/dL — ABNORMAL HIGH (ref 8–23)
CO2: 28 mmol/L (ref 22–32)
Calcium: 8 mg/dL — ABNORMAL LOW (ref 8.9–10.3)
Chloride: 101 mmol/L (ref 98–111)
Creatinine, Ser: 1.08 mg/dL (ref 0.61–1.24)
GFR, Estimated: >60 mL/min (ref >60)
Glucose, Bld: 100 mg/dL — ABNORMAL HIGH (ref 70–99)
Phosphorus: 2.3 mg/dL — ABNORMAL LOW (ref 2.5–4.6)
Potassium: 4 mmol/L (ref 3.5–5.1)
Sodium: 143 mmol/L (ref 135–145)

## 2022-11-16 LAB — CBC
HCT: 35.9 % — ABNORMAL LOW (ref 39.0–52.0)
Hemoglobin: 10.5 g/dL — ABNORMAL LOW (ref 13.0–17.0)
MCH: 26.4 pg (ref 26.0–34.0)
MCHC: 29.2 g/dL — ABNORMAL LOW (ref 30.0–36.0)
MCV: 90.4 fL (ref 80.0–100.0)
Platelets: 104 10*3/uL — ABNORMAL LOW (ref 150–400)
RBC: 3.97 MIL/uL — ABNORMAL LOW (ref 4.22–5.81)
RDW: 20.8 % — ABNORMAL HIGH (ref 11.5–15.5)
WBC: 9.8 10*3/uL (ref 4.0–10.5)
nRBC: 0 % (ref 0.0–0.2)

## 2022-11-16 LAB — MAGNESIUM: Magnesium: 2.2 mg/dL (ref 1.7–2.4)

## 2022-11-16 LAB — GLUCOSE, CAPILLARY
Glucose-Capillary: 102 mg/dL — ABNORMAL HIGH (ref 70–99)
Glucose-Capillary: 88 mg/dL (ref 70–99)
Glucose-Capillary: 83 mg/dL (ref 70–99)
Glucose-Capillary: 127 mg/dL — ABNORMAL HIGH (ref 70–99)

## 2022-11-16 LAB — PROTIME-INR
INR: 3 — ABNORMAL HIGH (ref 0.8–1.2)
Prothrombin Time: 31.3 seconds — ABNORMAL HIGH (ref 11.4–15.2)

## 2022-11-16 MED ORDER — ARFORMOTEROL TARTRATE 15 MCG/2ML IN NEBU: 15.0000 ug | INHALATION_SOLUTION | Freq: Two times a day (BID) | RESPIRATORY_TRACT | Status: DC

## 2022-11-16 MED ORDER — SACCHAROMYCES BOULARDII 250 MG PO CAPS: 250.0000 mg | ORAL_CAPSULE | Freq: Two times a day (BID) | ORAL | Status: AC

## 2022-11-16 MED ORDER — INSULIN ASPART 100 UNIT/ML IJ SOLN: 0.0000 [IU] | INTRAMUSCULAR | Status: DC

## 2022-11-16 MED ORDER — BUDESONIDE 0.5 MG/2ML IN SUSP: 0.5000 mg | Freq: Two times a day (BID) | RESPIRATORY_TRACT | Status: DC

## 2022-11-16 MED ORDER — TORSEMIDE 20 MG PO TABS
20.0000 mg | ORAL_TABLET | Freq: Every day | ORAL | Status: DC
  Administered 2022-11-16: 20 mg via ORAL
  Filled 2022-11-16: qty 1

## 2022-11-16 MED ORDER — METHYLPREDNISOLONE SODIUM SUCC 125 MG IJ SOLR: 60.0000 mg | Freq: Every day | INTRAMUSCULAR | Status: DC

## 2022-11-16 MED ORDER — PANTOPRAZOLE SODIUM 40 MG PO TBEC: 40.0000 mg | DELAYED_RELEASE_TABLET | Freq: Every day | ORAL | Status: DC

## 2022-11-16 MED ORDER — TORSEMIDE 20 MG PO TABS: 20.0000 mg | ORAL_TABLET | Freq: Every day | ORAL | Status: DC

## 2022-11-16 NOTE — Progress Notes (Signed)
Physical Therapy Evaluation Patient Details Name: OVADIA LOPP MRN: [id number] DOB: [birth year] Today's Date: 11/16/2022  History of Present Illness  82 yo m referred to ED 6/18 by physician with critical limb ischemia. He is now s/p endarterectomy/bypass on 6/25, s/p R foot transmet amputation on 7/2. 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   Clinical Impression  MAHKAI FANGMAN is 82 y.o. male admitted with above HPI and diagnosis. Patient is currently limited by functional impairments below (see PT problem list). Patient lives with spouse and PTA in June was mobilizing at [facility] Independent level with RW. Pt greatly limited by weakness and poor activity tolerance but was able to complete supine<>sit with min assist and maintained SpO2 in >90% on 35L/min HHFNCwith seated activity at EOB, BP with initial drop but recovered and pt not symptomatic. Patient will benefit from continued skilled PT interventions to address impairments and progress independence with mobility. Acute PT will follow and progress as able.           11/16/22 1109  PT Visit Information  Last PT Received On 11/16/22  Assistance Needed +2 (for OOB)  History of Present Illness 82 yo m referred to ED 6/18 by physician with critical limb ischemia. He is now s/p endarterectomy/bypass on 6/25, s/p R foot transmet amputation on 7/2. 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   Precautions  Precautions Fall  Precaution Comments watch SpO2 (pt holds breath)  Restrictions  Weight Bearing Restrictions Yes  RLE Weight Bearing NWB  Home Living  Family/patient expects to be discharged to: Private residence  Living Arrangements Spouse/significant other  Available Help at Discharge Family;Available 24 hours/day  Type of Home House  Home Access Stairs to enter  Entrance Stairs-Number of Steps 1  Entrance Stairs-Rails Right;Left  Home Layout Two level;Able to live on main level with bedroom/bathroom  Alternate Level Stairs-Number of Steps flight  Bathroom Shower/Tub Tub/shower unit  Horticulturist, commercial No  Home Research scientist (physical sciences) (2 wheels);Cane - single point;Shower seat;Grab bars - tub/shower;Transport chair;Tub bench;Hand held shower head  Additional Comments does not use second level  Prior Function  Prior Level of Function  Independent/Modified Independent;Driving  Mobility Comments prior to Rt TMA in June pt was ambulating with RW  ADLs Comments wife is blind, does IADLs, pt drives  Communication  Communication No difficulties  Pain Assessment  Pain Assessment Faces  Faces Pain Scale 4  Pain Location genearlized in LE's with mobility  Pain Descriptors / Indicators Discomfort  Pain Intervention(s) Limited activity within patient's tolerance;Monitored during session;Repositioned  Cognition  Arousal/Alertness Awake/alert  Behavior During Therapy [facility] for tasks assessed/performed;Anxious  Overall Cognitive Status Within Functional Limits for tasks assessed  General Comments pt alert&oriented x4 and follows commands well. pt anxious regarding mobility. has some slight confusion on timeline of events.  Upper Extremity Assessment  Upper Extremity Assessment Defer to OT evaluation  Lower Extremity Assessment  Lower Extremity Assessment Generalized weakness  Cervical / Trunk Assessment  Cervical / Trunk Assessment  Normal   Bed Mobility  Overal bed mobility Needs Assistance  Bed Mobility Supine to Sit  Supine to sit Min assist;HOB elevated  Sit to supine Min assist  General bed mobility comments patient required min assist and cues for use of bed rail. able to initiate bringing LE's off EOB but assist to fully pivot and raise trunk required. Pt reliant on bil UE support for seated balance at EOB. Min assist to return to supine due to fatigue/weakness.  Balance  Overall balance assessment Needs assistance  Sitting-balance support Feet supported;Bilateral upper extremity supported  Sitting balance-Leahy Scale Fair  Sitting balance - Comments intermittent assist and clsoe supervision/guard for balance, pt reliant on bil UE support  General Comments  General comments (skin integrity, edema, etc.) pt on 35L HHFNC and SpO2 remained 90% or greater during mobility. BP with initial drop but recovered and pt not symptomatic.  Other Exercises  Other Exercises attempted bil LE LAQ, pt completed 4 reps but fatigued and posteriorly leaning with activity.  PT - End of Session  Equipment Utilized During Treatment Oxygen  Activity Tolerance Patient tolerated treatment well  Patient left in bed;with call bell/phone within reach;with bed alarm set;with family/visitor present  Nurse Communication Mobility status  PT Assessment  PT Recommendation/Assessment Patient needs continued PT services  PT Visit Diagnosis Muscle weakness (generalized) (M62.81);Difficulty in walking, not elsewhere classified (R26.2);Other abnormalities of gait and mobility (R26.89);Unsteadiness on feet (R26.81)  Pain - part of body  (genearlized)  PT Problem List Decreased strength;Decreased range of motion;Decreased activity tolerance;Decreased balance;Decreased mobility;Decreased coordination;Decreased cognition;Cardiopulmonary status limiting activity;Decreased skin integrity;Pain;Impaired sensation;Decreased knowledge of precautions;Decreased safety  awareness;Decreased knowledge of use of DME  PT Plan  PT Frequency (ACUTE ONLY) Min 1X/week  PT Treatment/Interventions (ACUTE ONLY) DME instruction;Gait training;Functional mobility training;Stair training;Therapeutic activities;Therapeutic exercise;Balance training;Cognitive remediation;Patient/family education;Neuromuscular re-education;Wheelchair mobility training  AM-PAC PT "6 Clicks" Mobility Outcome Measure (Version 2)  Help needed turning from your back to your side while in a flat bed without using bedrails? 3  Help needed moving from lying on your back to sitting on the side of a flat bed without using bedrails? 3  Help needed moving to and from a bed to a chair (including a wheelchair)? 1  Help needed standing up from a chair using your arms (e.g., wheelchair or bedside chair)? 1  Help needed to walk in hospital room? 1  Help needed climbing 3-5 steps with a railing?  1  6 Click Score 10  Consider Recommendation of Discharge To: CIR/SNF/LTACH  Progressive Mobility  What is the highest level of mobility based on the progressive mobility assessment? Level 2 (Chairfast) - Balance while sitting on edge of bed and cannot stand  Mobility Referral No  Activity Dangled on edge of bed  PT Recommendation  Follow Up Recommendations PT at Long-term acute care hospital  Can patient physically be transported by private vehicle No  Assistance recommended at discharge Frequent or constant Supervision/Assistance  Patient can return home with the following A lot of help with bathing/dressing/bathroom;Assistance with cooking/housework;Direct supervision/assist for medications management;Direct supervision/assist for financial management;Assist for transportation;Help with stairs or ramp for entrance;Two people to help with walking and/or transfers  Functional Status Assessment Patient has had a recent decline in their functional status and demonstrates the ability to make significant improvements in  function in a reasonable and predictable amount of time.  PT equipment None recommended by PT  Individuals Consulted  Consulted and Agree with Results and Recommendations Patient;Family member/caregiver  Family Member  Consulted wife  Acute Rehab PT Goals  Patient Stated Goal get stronger  PT Goal Formulation With patient/family  Time For Goal Achievement 11/30/22  Potential to Achieve Goals Fair  PT Time Calculation  PT Start Time (ACUTE ONLY) 1140  PT Stop Time (ACUTE ONLY) 1207  PT Time Calculation (min) (ACUTE ONLY) 27 min  PT General Charges  $$ ACUTE PT VISIT 1 Visit  PT Treatments  $Therapeutic Activity 23-37 mins  Written Expression  Dominant Hand Left     Wynn Maudlin, DPT Acute Rehabilitation Services Office [phone]

## 2022-11-16 NOTE — TOC Transition Note (Addendum)
Transition of Care [facility]) - CM/SW Discharge Note   Patient Details  Name: [name] MRN: [id number] Date of Birth: 13-May-1940  [facility] CM/SW Contact:  Lawerance Sabal, RN Phone Number: 11/16/2022, 1:13 PM   Clinical Narrative:     Sherron Monday w patient's wife, confirmed choice for [facility], MD and [facility] notified. Plan for [facility] today to [facility], awaiting room number and number to call report from [facility] liaison Latoya.  Patient will be moved to 5E01, nurse can call report to [phone]    Barriers to Discharge: Continued Medical Work up   Patient Goals and CMS Choice      Discharge Placement                         Discharge Plan and Services Additional resources added to the After Visit Summary for   In-house Referral: Clinical Social Work   Post Acute Care Choice: Skilled Nursing Facility                               Social Determinants of Health (SDOH) Interventions SDOH Screenings   Food Insecurity: No Food Insecurity (11/09/2022)  Housing: Low Risk  (11/09/2022)  Transportation Needs: No Transportation Needs (11/09/2022)  Utilities: Not At Risk (11/09/2022)  Alcohol Screen: Low Risk  (07/13/2022)  Depression (PHQ2-9): Low Risk  (07/13/2022)  Financial Resource Strain: Low Risk  (07/13/2022)  Physical Activity: Insufficiently Active (07/13/2022)  Social Connections: Moderately Integrated (07/13/2022)  Stress: No Stress Concern Present (07/13/2022)  Tobacco Use: Medium Risk (11/07/2022)     Readmission Risk Interventions    11/10/2022   12:01 PM  Readmission Risk Prevention Plan  Transportation Screening Complete  Medication Review (RN Care Manager) Complete  PCP or Specialist appointment within 3-5 days of discharge Complete  HRI or Home Care Consult Complete  SW Recovery Care/Counseling Consult Complete  Palliative Care Screening Not Applicable  Skilled Nursing Facility Complete

## 2022-11-16 NOTE — Discharge Instructions (Signed)
[facility]

## 2022-11-16 NOTE — Discharge Summary (Signed)
Physician Discharge Summary  TREVIS EDEN UJW:[id number] DOB: Jan 12, 1941 DOA: 11/07/2022  PCP: [name], MD  Admit date: 11/07/2022 Discharge date: 11/16/2022  Admitted From: (SNF) Disposition:  [facility] hospital)  Recommendations for Outpatient Follow-up:  Continue to wean oxygen as tolerated On prednisone 20 mg oral daily at baseline home, he is currently on IV steroids at time of discharge Continue to monitor INR and dose warfarin dose as needed  Diet recommendation: Heart Healthy   Brief/Interim Summary:  82 year old man who presented to [facility] ED 7/15 for SOB. PMHx significant for HTN, HLD, CAD (s/p CABG), PAF with history of DVT (s/p IVC filter, on Coumadin), HFrEF (Echo 6/28 with EF 55-60%), COPD, AAA, bilateral RAS, CKD stage IIIb, subclavian artery stenosis and severe PAD (s/p R iliofemoral endarterectomy, popliteal endarterectomy and R TMA with [facility] 6/25, 7/2). Recently admitted to [facility] 6/18 - 7/8 for sepsis due to RLE infection (s/p surgical intervention), discharged to rehab [facility]).   History is obtained primarily from patient's wife/from chart review. Per patient's wife, discharged from [facility] 7/8 to [facility] for rehab postoperatively. EMS called for SOB/respiratory distress; per report SpO2 73% on RA, improved to 100% with NRB mask. Per facility records, patient had been on ceftriaxone/clindamycin x 5 days for PNA treatment.   On ED arrival, patient temp was 99.56F with HR 80, RR 23, BP 117/93, SpO2 100% on NRB, transitioned to BiPAP. Labs were notable for WBC 11.9, Hgb 9.7, Plt 305. INR 3.6 (on Coumadin). Na 145, K 4.7, CO2 25, Cr 1.18 (baseline); LFTs WNL. BNP 997 (585), trop 76 > 89, suspect demand-related. COVID positive. CXR demonstrated worsening RML/RLL PNA with R pleural effusion. Broad-spectrum antibiotic coverage initiated.  Patient was admitted initially to ICU, he was weaned off continuous BiPAP, transferred to progressive unit, where he was requiring BiPAP  intermittently, with decompensation on 7/21 where he required go back to ICU, but he has improved after diuresis and back to progressive unit on 7/24, he is currently tolerating heated high flow nasal cannula with as needed BiPAP, will be transferred to [facility] hospital for further weaning.    Pertinent Medical History:  HFrEF, AAA, bilateral renal artery stenosis, CKD stage IIIb, COPD, CAD, HTN, HLD, PAF with history of DVT anticoagulated with Coumadin and IVC filter in place, PAD, and, subclavian artery stenosis    Significant Hospital Events: Including procedures, antibiotic start and stop dates in addition to other pertinent events   7/15 - Presented to ED with SOB/resp distress. Placed on BiPAP. CXR with multifocal PNA, primarily RML/RLL, R pleural effusion. Broad-spectrum antibiotics started. PCCM consulted for admission. 7/16 of BIPAP to [place] -0 Doing well on BiPAP. Transitioned to 4L [place] and tolerating well. Hypoglycemic borderline this morning. Diuresed well with lasix, remains positive for the admission.  7/17 to [facility] 7/21 called back/ moved to ICU, worsening CXR, hypoxia, BiPAP 7/22 ongoing diureses, improving O2 requirements, BiPAP prn, HHFNC 40L, 50%, code status changed to DNR/ DNI per pt 7/24 transferred to select hospital  Multifocal PNA, HCAP vs. COVID-associated PNA, pulmonary edema with pleural effusion COVID-19 infection COPD with acute exacerbation in the setting of PNA - tx back to IUC 7/21 on BiPAP, worsening bilateral infiltrates, right effusion.  Back to [facility] 7/22 w/ prn BiPAP - ESR neg, PCT flat, CRP 1.9 (barely elevated), doubt amio toxicity - passed bedside swallow 7/22 - continued improvement in FiO2, diuresis as needed, started on scheduled torsemide. - encouraged him to wear BiPAP at night, with naps, and prn  for WOB. Did not wear overnight, now more fatigued.  Otherwise, weaning HHFNC, 40L/ 40-50% - goal sat 88-94% - prn CXR - cont solumedrol taper, decrease to  60mg  7/24, he is on chronic prednisone at home. - cont BD scheduled/ prn - aggressive pulmonary hygiene- OOB w/ PT, IS, flutter - advance diet as tolerated when off BiPAP - monitor off abx, remains afebrile with slow clinical improvement,   - cont contact/ airborne precautions   Acute on chronic chronic HFrEF; has had recovery of ejection fraction to 55 to 60% on most recent Echo Severe aortic valve stenosis - Cardiac monitoring Please see above discussion regarding diuresis - will need f/u with cardiology as an outpatient   Paroxysmal atrial fibrillation anticoagulated with Coumadin PTA. Failed DCCV January 2024. History of DVT Paroxysmal Afib - cont telemetry monitoring - cont amio PO - warfarin per pharmacy - cont to optimize electrolytes, K > 4, Mag > 2   Recent critical limb ischemia of right lower extremity with gangrene Extensive peripheral arterial disease - S/p R iliofem EA/popliteal EA 6/25, R TMA 7/2 - Outpatient management per [facility] - site remains wnl - cont ASA/statin - cont Coumadin per pharmacy  - PT/OT   Abdominal aortic aneurysm- 5.5 cm on recent imaging, enlarged per prior image - Continue ASA/statin - Outpatient monitoring/mgmt per [facility]   CKD stage IIIa, stable - sCr 1.02> 1.16, good UOP with diureses yesterday.  Wts down.  - cont condom cath - trend renal indices  - strict I/Os, daily wts - avoid nephrotoxins, renal dose meds, hemodynamic support as above   Hypothyroidism - cont levothyroxine   Anemia Thrombocytopenia - 7/23 - ptls trending down> monitor.  H/H stable, no signs of bleeding - transfuse for Hgb < 7   Deconditioning Protein calorie malnutrition - PT/ OT - maximize nutrition   Discharge Diagnoses:  Principal Problem:   Multifocal pneumonia Active Problems:   ARDS (adult respiratory distress syndrome) (HCC)   Critical limb ischemia of right lower extremity with ulceration of foot (HCC)   PAF (paroxysmal atrial fibrillation)  (HCC)   CAD (coronary artery disease), native coronary artery   Aortic valve stenosis   CKD (chronic kidney disease) stage 3, GFR 30-59 ml/min (HCC)   COPD (chronic obstructive pulmonary disease) (HCC)   Heart failure with preserved ejection fraction (HCC)   Pressure injury of skin   Acute on chronic respiratory failure with hypoxia and hypercapnia (HCC)    Discharge Instructions  Discharge Instructions     Diet - low sodium heart healthy   Complete by: As directed    Discharge instructions   Complete by: As directed    Management per select hospital   Discharge wound care:   Complete by: As directed    Pressure Injury 11/07/22 Ankle Anterior;Right Deep Tissue Pressure Injury - Purple or maroon localized area of discolored intact skin or blood-filled blister due to damage of underlying soft tissue from pressure and/or shear. 8 days    Pressure Injury 11/07/22 Sacrum Medial Stage 2 -  Partial thickness loss of dermis presenting as a shallow open injury with a red, pink wound bed without slough. 8 days   Increase activity slowly   Complete by: As directed       Allergies as of 11/16/2022       Reactions   Plavix [clopidogrel Bisulfate]    Brain hemorrhage prev while on plavix and aspirin        Medication List     STOP  taking these medications    albuterol 108 (90 Base) MCG/ACT inhaler Commonly known as: VENTOLIN HFA   cefTRIAXone 2-2.22 GM-%(50ML) IVPB Commonly known as: ROCEPHIN   clindamycin 300 MG capsule Commonly known as: CLEOCIN   enoxaparin 80 MG/0.8ML injection Commonly known as: LOVENOX   mometasone-formoterol 200-5 MCG/ACT Aero Commonly known as: DULERA   pregabalin 50 MG capsule Commonly known as: LYRICA       TAKE these medications    acetaminophen 325 MG tablet Commonly known as: TYLENOL Take 2 tablets (650 mg total) by mouth every 6 (six) hours as needed for mild pain, fever or headache.   alum & mag hydroxide-simeth 200-200-20 MG/5ML  suspension Commonly known as: MAALOX/MYLANTA Take 15-30 mLs by mouth every 2 (two) hours as needed for indigestion.   amiodarone 200 MG tablet Commonly known as: PACERONE TAKE 1 TABLET BY MOUTH TWICE A DAY   arformoterol 15 MCG/2ML Nebu Commonly known as: BROVANA Take 2 mLs (15 mcg total) by nebulization 2 (two) times daily.   aspirin EC 81 MG tablet Take 1 tablet (81 mg total) by mouth daily at 6 (six) AM. Swallow whole.   atorvastatin 20 MG tablet Commonly known as: LIPITOR TAKE 1 TABLET BY MOUTH EVERY DAY   budesonide 0.5 MG/2ML nebulizer solution Commonly known as: PULMICORT Take 2 mLs (0.5 mg total) by nebulization 2 (two) times daily.   insulin aspart 100 UNIT/ML injection Commonly known as: novoLOG Inject 0-15 Units into the skin every 4 (four) hours.   ipratropium-albuterol 0.5-2.5 (3) MG/3ML Soln Commonly known as: DUONEB Take 3 mLs by nebulization in the morning and at bedtime.   levothyroxine 150 MCG tablet Commonly known as: SYNTHROID TAKE ONE TABLET BY MOUTH EVERY DAY BEFORE BREAKFAST What changed: See the new instructions.   methylPREDNISolone sodium succinate 125 mg/2 mL injection Commonly known as: SOLU-MEDROL Inject 0.96 mLs (60 mg total) into the vein daily. Start taking on: November 17, 2022   pantoprazole 40 MG tablet Commonly known as: PROTONIX Take 1 tablet (40 mg total) by mouth daily. Start taking on: November 17, 2022   predniSONE 20 MG tablet Commonly known as: DELTASONE Take 20 mg by mouth daily with breakfast.   saccharomyces boulardii 250 MG capsule Commonly known as: FLORASTOR Take 1 capsule (250 mg total) by mouth 2 (two) times daily for 7 days.   tamsulosin 0.4 MG Caps capsule Commonly known as: FLOMAX Take 1 capsule (0.4 mg total) by mouth daily.   torsemide 20 MG tablet Commonly known as: DEMADEX Take 1 tablet (20 mg total) by mouth daily. Start taking on: November 17, 2022   warfarin 5 MG tablet Commonly known as: COUMADIN Take  as directed. If you are unsure how to take this medication, talk to your nurse or doctor. Original instructions: Take 2.5 mg by mouth See admin instructions. Take 2.5 mg by mouth once daily except Saturday.               Discharge Care Instructions  (From admission, onward)           Start     Ordered   11/16/22 0000  Discharge wound care:       Comments: Pressure Injury 11/07/22 Ankle Anterior;Right Deep Tissue Pressure Injury - Purple or maroon localized area of discolored intact skin or blood-filled blister due to damage of underlying soft tissue from pressure and/or shear. 8 days    Pressure Injury 11/07/22 Sacrum Medial Stage 2 -  Partial thickness loss of dermis presenting  as a shallow open injury with a red, pink wound bed without slough. 8 days   11/16/22 1213            Allergies  Allergen Reactions   Plavix [Clopidogrel Bisulfate]     Brain hemorrhage prev while on plavix and aspirin    Consultations: PCCM.   Procedures/Studies: DG Chest Port 1 View  Result Date: 11/14/2022 CLINICAL DATA:  Shortness of breath. EXAM: PORTABLE CHEST 1 VIEW COMPARISON:  November 13, 2022. FINDINGS: Stable cardiomediastinal silhouette. Status post coronary bypass graft. Stable bilateral diffuse lung opacities are noted, right greater than left. Bilateral pleural effusions are also noted, right greater than left. Bony thorax is unremarkable. IMPRESSION: Stable bilateral lung opacities and associated effusions. Electronically Signed   By: Lupita Raider M.D.   On: 11/14/2022 11:47   DG CHEST PORT 1 VIEW  Result Date: 11/13/2022 CLINICAL DATA:  Hypoxia EXAM: PORTABLE CHEST 1 VIEW COMPARISON:  11/08/2022 FINDINGS: Cardiac shadow is enlarged but stable. Postsurgical changes are seen. Increasing right-sided pleural effusion is noted. Stable interstitial edema is noted. Worsening density in the right mid and left mid lungs is seen consistent with acute on chronic infiltrate. No bony  abnormality is noted. IMPRESSION: Worsening infiltrates bilaterally. Increasing right-sided pleural effusion. Electronically Signed   By: Alcide Clever M.D.   On: 11/13/2022 18:17   DG Chest Port 1 View  Result Date: 11/08/2022 CLINICAL DATA:  Follow-up pneumonia EXAM: PORTABLE CHEST 1 VIEW COMPARISON:  11/07/2022 FINDINGS: Cardiac shadow is stable. Aortic calcifications are seen. Postsurgical changes are again noted. Diffuse interstitial changes are again noted slightly increased consistent with worsening edema. Persistent right basilar infiltrate is seen. No pneumothorax is noted. IMPRESSION: Worsening interstitial edema. Stable right basilar infiltrate is noted. Electronically Signed   By: Alcide Clever M.D.   On: 11/08/2022 10:08   DG Chest Port 1 View  Result Date: 11/07/2022 CLINICAL DATA:  Shortness of breath.  Pneumonia. EXAM: PORTABLE CHEST 1 VIEW COMPARISON:  10/29/2022 FINDINGS: Previous median sternotomy and CABG. Cardiomegaly and aortic atherosclerosis as seen previously. Previous brachiocephalic vessel stent. Worsening of infiltrate in the mid and lower lung on the right, with a right effusion. No focal infiltrate seen on the left. Question pulmonary venous hypertension/fluid overload in general. IMPRESSION: 1. Worsening of infiltrate in the mid and lower lung on the right, with a right effusion. 2. Question pulmonary venous hypertension/fluid overload in general. 3. Previous CABG. Cardiomegaly. Aortic atherosclerosis. Electronically Signed   By: Paulina Fusi M.D.   On: 11/07/2022 14:42   DG Chest Port 1 View  Result Date: 10/29/2022 CLINICAL DATA:  Dyspnea. EXAM: PORTABLE CHEST 1 VIEW COMPARISON:  03/23/2022 and older studies. FINDINGS: Stable changes from previous CABG surgery. Cardiac silhouette is normal in size. Aortic arterial atherosclerotic calcifications. Left subclavian vascular stent. These findings are stable. No mediastinal or hilar masses. Bilateral interstitial thickening  similar to the prior exam. No lung consolidation. No convincing pleural effusion and no pneumothorax. Skeletal structures are grossly intact. IMPRESSION: 1. Bilateral interstitial thickening that may all be chronic. Cannot exclude a component of interstitial edema. No lung consolidation to suggest pneumonia. Electronically Signed   By: Amie Portland M.D.   On: 10/29/2022 13:23   ECHOCARDIOGRAM COMPLETE  Result Date: 10/21/2022    ECHOCARDIOGRAM REPORT   Patient Name:   Oreoluwa NIGIL BRAMAN Date of Exam: 10/21/2022 Medical Rec #:  [id number]      Height:       69.0 in Accession #:  [id number]     Weight:       153.2 lb Date of Birth:  22-Mar-1941       BSA:          1.845 m Patient Age:    82 years       BP:           132/53 mmHg Patient Gender: M              HR:           72 bpm. Exam Location:  Inpatient Procedure: 2D Echo, Cardiac Doppler and Color Doppler Indications:    Hypotension  History:        Patient has prior history of Echocardiogram examinations, most                 recent 02/09/2022. CAD, Aortic Valve Disease, Arrythmias:Atrial                 Fibrillation; Risk Factors:Hypertension.  Sonographer:    Darlys Gales Referring Phys: [phone] PAULA B SIMPSON IMPRESSIONS  1. Left ventricular ejection fraction, by estimation, is 55 to 60%. The left ventricle has normal function. The left ventricle has no regional wall motion abnormalities. There is mild left ventricular hypertrophy. Left ventricular diastolic parameters were normal.  2. Right ventricular systolic function is low normal. The right ventricular size is normal.  3. Mild mitral valve regurgitation.  4. AV is thickened, calcified with restricted motion. Peak and mean gradients through the valvea are 47 and 31 mm Hg respectively. AVA (VTI) 0.83 cm2. Dimensionless index 0.26 consistent with severe AS.Marland Kitchen Aortic valve regurgitation is not visualized.  5. The inferior vena cava is dilated in size with <50% respiratory variability, suggesting right atrial  pressure of 15 mmHg. FINDINGS  Left Ventricle: Left ventricular ejection fraction, by estimation, is 55 to 60%. The left ventricle has normal function. The left ventricle has no regional wall motion abnormalities. The left ventricular internal cavity size was normal in size. There is  mild left ventricular hypertrophy. Left ventricular diastolic parameters were normal. Right Ventricle: The right ventricular size is normal. Right vetricular wall thickness was not assessed. Right ventricular systolic function is low normal. Left Atrium: Left atrial size was normal in size. Right Atrium: Right atrial size was normal in size. Pericardium: Trivial pericardial effusion is present. Mitral Valve: There is mild thickening of the mitral valve leaflet(s). Mild mitral valve regurgitation. Tricuspid Valve: The tricuspid valve is normal in structure. Tricuspid valve regurgitation is trivial. Aortic Valve: AV is thickened, calcified with restricted motion. Peak and mean gradients through the valvea are 47 and 31 mm Hg respectively. AVA (VTI) 0.83 cm2. Dimensionless index 0.26 consistent with severe AS. Aortic valve regurgitation is not visualized. Aortic valve mean gradient measures 29.5 mmHg. Aortic valve peak gradient measures 44.6 mmHg. Aortic valve area, by VTI measures 0.83 cm. Pulmonic Valve: The pulmonic valve was not well visualized. Pulmonic valve regurgitation is not visualized. Aorta: The aortic root and ascending aorta are structurally normal, with no evidence of dilitation. Venous: The inferior vena cava is dilated in size with less than 50% respiratory variability, suggesting right atrial pressure of 15 mmHg. IAS/Shunts: No atrial level shunt detected by color flow Doppler.  LEFT VENTRICLE PLAX 2D LVIDd:         4.80 cm   Diastology LVIDs:         3.40 cm   LV e' medial:    6.31 cm/s LV PW:  1.20 cm   LV E/e' medial:  18.1 LV IVS:        1.20 cm   LV e' lateral:   12.00 cm/s LVOT diam:     2.00 cm   LV E/e'  lateral: 9.5 LV SV:         67 LV SV Index:   36 LVOT Area:     3.14 cm  RIGHT VENTRICLE RV S prime:     9.14 cm/s TAPSE (M-mode): 1.9 cm LEFT ATRIUM             Index        RIGHT ATRIUM           Index LA diam:        2.20 cm 1.19 cm/m   RA Area:     13.50 cm LA Vol (A2C):   32.6 ml 17.67 ml/m  RA Volume:   29.10 ml  15.77 ml/m LA Vol (A4C):   29.4 ml 15.94 ml/m LA Biplane Vol: 33.1 ml 17.94 ml/m  AORTIC VALVE AV Area (Vmax):    0.78 cm AV Area (Vmean):   0.80 cm AV Area (VTI):     0.83 cm AV Vmax:           334.00 cm/s AV Vmean:          260.000 cm/s AV VTI:            0.806 m AV Peak Grad:      44.6 mmHg AV Mean Grad:      29.5 mmHg LVOT Vmax:         83.00 cm/s LVOT Vmean:        65.900 cm/s LVOT VTI:          0.212 m LVOT/AV VTI ratio: 0.26  AORTA Ao Root diam: 2.30 cm Ao Asc diam:  2.50 cm MITRAL VALVE                TRICUSPID VALVE MV Area (PHT): 4.39 cm     TR Peak grad:   33.2 mmHg MV Decel Time: 173 msec     TR Vmax:        288.00 cm/s MV E velocity: 114.00 cm/s MV A velocity: 37.70 cm/s   SHUNTS MV E/A ratio:  3.02         Systemic VTI:  0.21 m                             Systemic Diam: 2.00 cm Dietrich Pates MD Electronically signed by Dietrich Pates MD Signature Date/Time: 10/21/2022/6:01:30 PM    Final       Subjective: No significant events overnight, reports he is feeling better today  Discharge Exam: Vitals:   11/16/22 0800 11/16/22 1000  BP:    Pulse:    Resp:    Temp: 97.9 F (36.6 C)   SpO2:  91%   Vitals:   11/16/22 0037 11/16/22 0400 11/16/22 0800 11/16/22 1000  BP: (!) 115/54     Pulse: 65 69    Resp:  (!) 21    Temp: 97.6 F (36.4 C)  97.9 F (36.6 C)   TempSrc: Axillary  Oral   SpO2: 97% 92%  91%  Weight:      Height:        General: Pt is alert, awake, frail, chronically appearing, deconditioned Cardiovascular: RRR, S1/S2 +, no rubs, no gallops Respiratory: Good air entry bilaterally with  scattered Rales Abdominal: Soft, NT, ND, bowel sounds  + Extremities: no edema, no cyanosis, right lower extremity status post TMA, please see pictures under media section.    The results of significant diagnostics from this hospitalization (including imaging, microbiology, ancillary and laboratory) are listed below for reference.     Microbiology: Recent Results (from the past 240 hour(s))  Resp panel by RT-PCR (RSV, Flu A&B, Covid) Anterior Nasal Swab     Status: Abnormal   Collection Time: 11/07/22  2:02 PM   Specimen: Anterior Nasal Swab  Result Value Ref Range Status   SARS Coronavirus 2 by RT PCR POSITIVE (A) NEGATIVE Final   Influenza A by PCR NEGATIVE NEGATIVE Final   Influenza B by PCR NEGATIVE NEGATIVE Final    Comment: (NOTE) The Xpert Xpress SARS-CoV-2/FLU/RSV plus assay is intended as an aid in the diagnosis of influenza from Nasopharyngeal swab specimens and should not be used as a sole basis for treatment. Nasal washings and aspirates are unacceptable for Xpert Xpress SARS-CoV-2/FLU/RSV testing.  Fact Sheet for Patients: BloggerCourse.com  Fact Sheet for Healthcare Providers: SeriousBroker.it  This test is not yet approved or cleared by the [place] FDA and has been authorized for detection and/or diagnosis of SARS-CoV-2 by FDA under an Emergency Use Authorization (EUA). This EUA will remain in effect (meaning this test can be used) for the duration of the COVID-19 declaration under Section 564(b)(1) of the Act, 21 U.S.C. section 360bbb-3(b)(1), unless the authorization is terminated or revoked.     Resp Syncytial Virus by PCR NEGATIVE NEGATIVE Final    Comment: (NOTE) Fact Sheet for Patients: BloggerCourse.com  Fact Sheet for Healthcare Providers: SeriousBroker.it  This test is not yet approved or cleared by the [place] FDA and has been authorized for detection and/or diagnosis of SARS-CoV-2 by FDA  under an Emergency Use Authorization (EUA). This EUA will remain in effect (meaning this test can be used) for the duration of the COVID-19 declaration under Section 564(b)(1) of the Act, 21 U.S.C. section 360bbb-3(b)(1), unless the authorization is terminated or revoked.  Performed at [facility], [address], [place], [place] [address]   Blood culture (routine x 2)     Status: None   Collection Time: 11/07/22  3:06 PM   Specimen: BLOOD  Result Value Ref Range Status   Specimen Description BLOOD RIGHT ANTECUBITAL  Final   Special Requests   Final    BOTTLES DRAWN AEROBIC AND ANAEROBIC Blood Culture results may not be optimal due to an excessive volume of blood received in culture bottles   Culture   Final    NO GROWTH 5 DAYS Performed at [facility], [address], [place], [place] [address]    Report Status 11/12/2022 FINAL  Final  Blood culture (routine x 2)     Status: None   Collection Time: 11/07/22  3:07 PM   Specimen: BLOOD  Result Value Ref Range Status   Specimen Description BLOOD LEFT ANTECUBITAL  Final   Special Requests   Final    BOTTLES DRAWN AEROBIC AND ANAEROBIC Blood Culture results may not be optimal due to an excessive volume of blood received in culture bottles   Culture   Final    NO GROWTH 5 DAYS Performed at [facility], [address] [place], [place] [address]    Report Status 11/12/2022 FINAL  Final  MRSA Next Gen by PCR, Nasal     Status: None   Collection  Time: 11/07/22  8:15 PM   Specimen: Nasal Mucosa; Nasal Swab  Result Value Ref Range Status   MRSA by PCR Next Gen NOT DETECTED NOT DETECTED Final    Comment: (NOTE) The GeneXpert MRSA Assay (FDA approved for NASAL specimens only), is one component of a comprehensive MRSA colonization surveillance program. It is not intended to diagnose MRSA infection nor to guide or monitor treatment for MRSA infections. Test performance is not FDA approved in patients less than 95  years old. Performed at [facility], [address], [place], [place] [address]      Labs: BNP (last 3 results) Recent Labs    11/11/22 0815 11/12/22 0326 11/14/22 0158  BNP 858.9* 986.0* 933.0*   Basic Metabolic Panel: Recent Labs  Lab 11/12/22 0326 11/13/22 0710 11/14/22 0158 11/15/22 0101 11/16/22 0322  NA 143 140 144 145 143  K 3.5 3.4* 4.7 3.1* 4.0  CL 109 106 107 101 101  CO2 29 26 26 31 28   GLUCOSE 127* 145* 99 113* 100*  BUN 27* 30* 31* 34* 30*  CREATININE 0.99 0.97 1.02 1.16 1.08  CALCIUM 8.2* 8.0* 8.5* 7.9* 8.0*  MG  --   --   --  2.1 2.2  PHOS  --   --   --  3.3 2.3*   Liver Function Tests: Recent Labs  Lab 11/15/22 0101 11/16/22 0322  ALBUMIN 2.1* 2.1*   No results for input(s): "LIPASE", "AMYLASE" in the last 168 hours. No results for input(s): "AMMONIA" in the last 168 hours. CBC: Recent Labs  Lab 11/12/22 0326 11/13/22 0710 11/14/22 0158 11/15/22 0101 11/16/22 0322  WBC 8.6 9.5 10.1 9.7 9.8  HGB 9.1* 10.3* 12.1* 10.7* 10.5*  HCT 31.7* 35.4* 42.5 36.4* 35.9*  MCV 93.8 92.4 96.6 93.1 90.4  PLT 191 170 159 123* 104*   Cardiac Enzymes: No results for input(s): "CKTOTAL", "CKMB", "CKMBINDEX", "TROPONINI" in the last 168 hours. BNP: Invalid input(s): "POCBNP" CBG: Recent Labs  Lab 11/15/22 2010 11/16/22 0035 11/16/22 0451 11/16/22 0813 11/16/22 1140  GLUCAP 131* 102* 88 83 127*   D-Dimer No results for input(s): "DDIMER" in the last 72 hours. Hgb A1c No results for input(s): "HGBA1C" in the last 72 hours. Lipid Profile No results for input(s): "CHOL", "HDL", "LDLCALC", "TRIG", "CHOLHDL", "LDLDIRECT" in the last 72 hours. Thyroid function studies No results for input(s): "TSH", "T4TOTAL", "T3FREE", "THYROIDAB" in the last 72 hours.  Invalid input(s): "FREET3" Anemia work up No results for input(s): "VITAMINB12", "FOLATE", "FERRITIN", "TIBC", "IRON", "RETICCTPCT" in the last 72 hours. Urinalysis    Component Value  Date/Time   COLORURINE YELLOW 03/24/2021 1547   APPEARANCEUR CLEAR 03/24/2021 1547   LABSPEC 1.010 03/24/2021 1547   PHURINE 6.5 03/24/2021 1547   GLUCOSEU NEGATIVE 03/24/2021 1547   HGBUR NEGATIVE 03/24/2021 1547   BILIRUBINUR NEGATIVE 03/24/2021 1547   KETONESUR NEGATIVE 03/24/2021 1547   PROTEINUR NEGATIVE 12/22/2016 1121   UROBILINOGEN 0.2 03/24/2021 1547   NITRITE NEGATIVE 03/24/2021 1547   LEUKOCYTESUR NEGATIVE 03/24/2021 1547   Sepsis Labs Recent Labs  Lab 11/13/22 0710 11/14/22 0158 11/15/22 0101 11/16/22 0322  WBC 9.5 10.1 9.7 9.8   Microbiology Recent Results (from the past 240 hour(s))  Resp panel by RT-PCR (RSV, Flu A&B, Covid) Anterior Nasal Swab     Status: Abnormal   Collection Time: 11/07/22  2:02 PM   Specimen: Anterior Nasal Swab  Result Value Ref Range Status   SARS Coronavirus 2 by RT PCR POSITIVE (A) NEGATIVE  Final   Influenza A by PCR NEGATIVE NEGATIVE Final   Influenza B by PCR NEGATIVE NEGATIVE Final    Comment: (NOTE) The Xpert Xpress SARS-CoV-2/FLU/RSV plus assay is intended as an aid in the diagnosis of influenza from Nasopharyngeal swab specimens and should not be used as a sole basis for treatment. Nasal washings and aspirates are unacceptable for Xpert Xpress SARS-CoV-2/FLU/RSV testing.  Fact Sheet for Patients: BloggerCourse.com  Fact Sheet for Healthcare Providers: SeriousBroker.it  This test is not yet approved or cleared by the [place] FDA and has been authorized for detection and/or diagnosis of SARS-CoV-2 by FDA under an Emergency Use Authorization (EUA). This EUA will remain in effect (meaning this test can be used) for the duration of the COVID-19 declaration under Section 564(b)(1) of the Act, 21 U.S.C. section 360bbb-3(b)(1), unless the authorization is terminated or revoked.     Resp Syncytial Virus by PCR NEGATIVE NEGATIVE Final    Comment: (NOTE) Fact Sheet for  Patients: BloggerCourse.com  Fact Sheet for Healthcare Providers: SeriousBroker.it  This test is not yet approved or cleared by the [place] FDA and has been authorized for detection and/or diagnosis of SARS-CoV-2 by FDA under an Emergency Use Authorization (EUA). This EUA will remain in effect (meaning this test can be used) for the duration of the COVID-19 declaration under Section 564(b)(1) of the Act, 21 U.S.C. section 360bbb-3(b)(1), unless the authorization is terminated or revoked.  Performed at [facility], [address], [place], [place] [address]   Blood culture (routine x 2)     Status: None   Collection Time: 11/07/22  3:06 PM   Specimen: BLOOD  Result Value Ref Range Status   Specimen Description BLOOD RIGHT ANTECUBITAL  Final   Special Requests   Final    BOTTLES DRAWN AEROBIC AND ANAEROBIC Blood Culture results may not be optimal due to an excessive volume of blood received in culture bottles   Culture   Final    NO GROWTH 5 DAYS Performed at [facility], [address], [place], [place] [address]    Report Status 11/12/2022 FINAL  Final  Blood culture (routine x 2)     Status: None   Collection Time: 11/07/22  3:07 PM   Specimen: BLOOD  Result Value Ref Range Status   Specimen Description BLOOD LEFT ANTECUBITAL  Final   Special Requests   Final    BOTTLES DRAWN AEROBIC AND ANAEROBIC Blood Culture results may not be optimal due to an excessive volume of blood received in culture bottles   Culture   Final    NO GROWTH 5 DAYS Performed at [facility], [address] [place], [place] [address]    Report Status 11/12/2022 FINAL  Final  MRSA Next Gen by PCR, Nasal     Status: None   Collection Time: 11/07/22  8:15 PM   Specimen: Nasal Mucosa; Nasal Swab  Result Value Ref Range Status   MRSA by PCR Next Gen NOT DETECTED NOT DETECTED Final    Comment: (NOTE) The GeneXpert MRSA Assay  (FDA approved for NASAL specimens only), is one component of a comprehensive MRSA colonization surveillance program. It is not intended to diagnose MRSA infection nor to guide or monitor treatment for MRSA infections. Test performance is not FDA approved in patients less than 35 years old. Performed at [facility], [address] [place], [place] [address]      Time coordinating discharge: Over 30 minutes  SIGNED:   Huey Bienenstock, MD  [facility] 11/16/2022, 12:15 PM Pager   If 7PM-7AM, please contact night-coverage [email or web address]

## 2022-11-16 NOTE — Plan of Care (Signed)
  Problem: Education: Goal: Understanding of CV disease, CV risk reduction, and recovery process will improve Outcome: Progressing Goal: Individualized Educational Video(s) Outcome: Progressing   Problem: Activity: Goal: Ability to return to baseline activity level will improve Outcome: Progressing   Problem: Cardiovascular: Goal: Ability to achieve and maintain adequate cardiovascular perfusion will improve Outcome: Progressing Goal: Vascular access site(s) Level 0-1 will be maintained Outcome: Progressing   Problem: Health Behavior/Discharge Planning: Goal: Ability to safely manage health-related needs after discharge will improve Outcome: Progressing   Problem: Education: Goal: Knowledge of prescribed regimen will improve Outcome: Progressing   Problem: Activity: Goal: Ability to tolerate increased activity will improve Outcome: Progressing   Problem: Bowel/Gastric: Goal: Gastrointestinal status for postoperative course will improve Outcome: Progressing   Problem: Clinical Measurements: Goal: Postoperative complications will be avoided or minimized Outcome: Progressing Goal: Signs and symptoms of graft occlusion will improve Outcome: Progressing   Problem: Skin Integrity: Goal: Demonstration of wound healing without infection will improve Outcome: Progressing   Problem: Education: Goal: Knowledge of General Education information will improve Description: Including pain rating scale, medication(s)/side effects and non-pharmacologic comfort measures Outcome: Progressing   Problem: Health Behavior/Discharge Planning: Goal: Ability to manage health-related needs will improve Outcome: Progressing   Problem: Clinical Measurements: Goal: Ability to maintain clinical measurements within normal limits will improve Outcome: Progressing Goal: Will remain free from infection Outcome: Progressing Goal: Diagnostic test results will improve Outcome: Progressing Goal:  Respiratory complications will improve Outcome: Progressing Goal: Cardiovascular complication will be avoided Outcome: Progressing   Problem: Activity: Goal: Risk for activity intolerance will decrease Outcome: Progressing   Problem: Nutrition: Goal: Adequate nutrition will be maintained Outcome: Progressing   Problem: Coping: Goal: Level of anxiety will decrease Outcome: Progressing   Problem: Elimination: Goal: Will not experience complications related to bowel motility Outcome: Progressing Goal: Will not experience complications related to urinary retention Outcome: Progressing   Problem: Pain Managment: Goal: General experience of comfort will improve Outcome: Progressing   Problem: Safety: Goal: Ability to remain free from injury will improve Outcome: Progressing   Problem: Skin Integrity: Goal: Risk for impaired skin integrity will decrease Outcome: Progressing   Problem: Education: Goal: Knowledge of risk factors and measures for prevention of condition will improve Outcome: Progressing   Problem: Coping: Goal: Psychosocial and spiritual needs will be supported Outcome: Progressing   Problem: Respiratory: Goal: Will maintain a patent airway Outcome: Progressing Goal: Complications related to the disease process, condition or treatment will be avoided or minimized Outcome: Progressing   Problem: Education: Goal: Ability to describe self-care measures that may prevent or decrease complications (Diabetes Survival Skills Education) will improve Outcome: Progressing Goal: Individualized Educational Video(s) Outcome: Progressing   Problem: Coping: Goal: Ability to adjust to condition or change in health will improve Outcome: Progressing   Problem: Fluid Volume: Goal: Ability to maintain a balanced intake and output will improve Outcome: Progressing   Problem: Health Behavior/Discharge Planning: Goal: Ability to identify and utilize available resources  and services will improve Outcome: Progressing Goal: Ability to manage health-related needs will improve Outcome: Progressing   Problem: Metabolic: Goal: Ability to maintain appropriate glucose levels will improve Outcome: Progressing   Problem: Nutritional: Goal: Maintenance of adequate nutrition will improve Outcome: Progressing Goal: Progress toward achieving an optimal weight will improve Outcome: Progressing   Problem: Skin Integrity: Goal: Risk for impaired skin integrity will decrease Outcome: Progressing   Problem: Tissue Perfusion: Goal: Adequacy of tissue perfusion will improve Outcome: Progressing

## 2022-11-16 NOTE — TOC Progression Note (Addendum)
Transition of Care [facility]) - Initial/Assessment Note    Patient Details  Name: [name] MRN: [id number] Date of Birth: 1940/05/19  Transition of Care [facility]) CM/SW Contact:    Ralene Bathe, LCSW Phone Number: 11/16/2022, 11:00 AM  Clinical Narrative:                 LCSW received a call from the patient's spouse, [name], reporting that the family is in agreement with the patient discharging to [facility].  CSW, RNCM, and [facility] liaison updated.  Expected Discharge Plan: Skilled Nursing Facility Barriers to Discharge: Continued Medical Work up   Patient Goals and CMS Choice            Expected Discharge Plan and Services In-house Referral: Clinical Social Work   Post Acute Care Choice: Skilled Nursing Facility Living arrangements for the past 2 months: Single Family Home                                      Prior Living Arrangements/Services Living arrangements for the past 2 months: Single Family Home Lives with:: Spouse Patient language and need for interpreter reviewed:: Yes        Need for Family Participation in Patient Care: Yes (Comment) Care giver support system in place?: Yes (comment)   Criminal Activity/Legal Involvement Pertinent to Current Situation/Hospitalization: No - Comment as needed  Activities of Daily Living Home Assistive Devices/Equipment: Walker (specify type) ADL Screening (condition at time of admission) Patient's cognitive ability adequate to safely complete daily activities?: Yes Is the patient deaf or have difficulty hearing?: No Does the patient have difficulty seeing, even when wearing glasses/contacts?: No Does the patient have difficulty concentrating, remembering, or making decisions?: Yes Patient able to express need for assistance with ADLs?: No Does the patient have difficulty dressing or bathing?: Yes Independently performs ADLs?: Yes (appropriate for developmental age) Does the patient have difficulty walking  or climbing stairs?: Yes Weakness of Legs: Both Weakness of Arms/Hands: Both  Permission Sought/Granted                  Emotional Assessment Appearance:: Appears older than stated age     Orientation: : Oriented to Self, Oriented to Place, Oriented to  Time, Oriented to Situation Alcohol / Substance Use: Not Applicable Psych Involvement: No (comment)  Admission diagnosis:  COPD exacerbation (HCC) [J44.1] Acute on chronic respiratory failure with hypoxia and hypercapnia (HCC) [J96.21, J96.22] Multifocal pneumonia [J18.9] Pneumonia of right lung due to infectious organism, unspecified part of lung [J18.9] Patient Active Problem List   Diagnosis Date Noted   Acute on chronic respiratory failure with hypoxia and hypercapnia (HCC) 11/15/2022   Pressure injury of skin 11/13/2022   Multifocal pneumonia 11/07/2022   Critical limb ischemia of right lower extremity with ulceration of foot (HCC) 10/11/2022   Limb ischemia 10/11/2022   Heart failure with preserved ejection fraction (HCC) 03/23/2022   History of deep vein thrombosis (DVT) of lower extremity 03/23/2022   ARDS (adult respiratory distress syndrome) (HCC) 02/09/2022   Pneumothorax 02/06/2022   Venous stasis ulcer of left ankle limited to breakdown of skin with varicose veins (HCC) 09/22/2021   Aortic valve stenosis    Long term (current) use of anticoagulants 01/27/2017   DVT (deep venous thrombosis) (HCC) 12/29/2016   PAF (paroxysmal atrial fibrillation) (HCC) 05/24/2013   Aortic calcification (HCC) 05/01/2013   CKD (chronic kidney disease) stage  3, GFR 30-59 ml/min (HCC) 05/01/2013   Bilateral renal artery stenosis (HCC)    HTN (hypertension) 07/15/2012   Intraventricular hemorrhage (HCC) 07/12/2012   Peripheral arterial disease (HCC)    Ischemic cardiomyopathy 07/08/2011   CAD (coronary artery disease), native coronary artery 07/07/2011   Subclavian arterial stenosis (HCC) 04/25/2011   BPH (benign prostatic  hyperplasia) 12/29/2010   COPD (chronic obstructive pulmonary disease) (HCC) 01/08/2010   Abdominal aortic aneurysm (HCC) 11/06/2009   HYPERCHOLESTEROLEMIA 10/09/2009   History of prior cigarette smoking 04/11/2008   Hypothyroidism 09/07/2006   PCP:  [name], MD Pharmacy:   [facility] - [place], [place] - [address] [place] [address] Phone: [phone] Fax: [phone]  [facility] [address] [place] - [address] [place] [address] Phone: [phone] Fax: [phone]     Social Determinants of Health (SDOH) Social History: SDOH Screenings   Food Insecurity: No Food Insecurity (11/09/2022)  Housing: Low Risk  (11/09/2022)  Transportation Needs: No Transportation Needs (11/09/2022)  Utilities: Not At Risk (11/09/2022)  Alcohol Screen: Low Risk  (07/13/2022)  Depression (PHQ2-9): Low Risk  (07/13/2022)  Financial Resource Strain: Low Risk  (07/13/2022)  Physical Activity: Insufficiently Active (07/13/2022)  Social Connections: Moderately Integrated (07/13/2022)  Stress: No Stress Concern Present (07/13/2022)  Tobacco Use: Medium Risk (11/07/2022)   SDOH Interventions:     Readmission Risk Interventions    11/10/2022   12:01 PM  Readmission Risk Prevention Plan  Transportation Screening Complete  Medication Review (RN Care Manager) Complete  PCP or Specialist appointment within 3-5 days of discharge Complete  HRI or Home Care Consult Complete  SW Recovery Care/Counseling Consult Complete  Palliative Care Screening Not Applicable  Skilled Nursing Facility Complete

## 2022-11-16 NOTE — Plan of Care (Signed)
[facility]

## 2022-11-16 NOTE — Progress Notes (Signed)
ANTICOAGULATION CONSULT NOTE  Pharmacy Consult for warfarin Indication: history DVT  Allergies  Allergen Reactions   Plavix [Clopidogrel Bisulfate]     Brain hemorrhage prev while on plavix and aspirin    Patient Measurements: Height: 5\' 9"  (175.3 cm) Weight: 66.1 kg (145 lb 11.6 oz) IBW/kg (Calculated) : 70.7   Vital Signs: Temp: 97.9 F (36.6 C) (07/24 0800) Temp Source: Oral (07/24 0800) BP: 115/54 (07/24 0037) Pulse Rate: 69 (07/24 0400)  Labs: Recent Labs    11/14/22 0158 11/15/22 0101 11/16/22 0322  HGB 12.1* 10.7* 10.5*  HCT 42.5 36.4* 35.9*  PLT 159 123* 104*  LABPROT 31.0* 31.7* 31.3*  INR 3.0* 3.0* 3.0*  CREATININE 1.02 1.16 1.08    Estimated Creatinine Clearance: 50.2 mL/min (by C-G formula based on SCr of 1.08 mg/dL).  Assessment: 82 yo male with COPD exacerbation, PNA and COVID positive. He is on warfarin PTA for history of DVT (IVC in place). Pharmacy consulted to dose warfarin.   INR remains at 3.0, PT 31.3 - last warfarin dose 7/20. Platelets trending down to 104 - also on ASA.   Home warfarin dose: 2.5mg /day except take none on Saturday.  Goal of Therapy:  INR 2-3 Monitor platelets by anticoagulation protocol: Yes   Plan:  No warfarin dose today Plan to resume once INR < 3 Monitor H&H and platelets periodically   Rennis Petty, PharmD PGY1 Pharmacy Resident 11/16/2022 9:49 AM

## 2022-11-17 DIAGNOSIS — J449 Chronic obstructive pulmonary disease, unspecified: Secondary | ICD-10-CM

## 2022-11-17 DIAGNOSIS — J9621 Acute and chronic respiratory failure with hypoxia: Secondary | ICD-10-CM

## 2022-11-17 DIAGNOSIS — I480 Paroxysmal atrial fibrillation: Secondary | ICD-10-CM

## 2022-11-17 DIAGNOSIS — I5032 Chronic diastolic (congestive) heart failure: Secondary | ICD-10-CM

## 2022-11-17 DIAGNOSIS — N1831 Chronic kidney disease, stage 3a: Secondary | ICD-10-CM

## 2022-11-17 LAB — COMPREHENSIVE METABOLIC PANEL
ALT: 41 U/L (ref 0–44)
AST: 22 U/L (ref 15–41)
Albumin: 2.2 g/dL — ABNORMAL LOW (ref 3.5–5.0)
Alkaline Phosphatase: 63 U/L (ref 38–126)
Anion gap: 14 (ref 5–15)
BUN: 30 mg/dL — ABNORMAL HIGH (ref 8–23)
CO2: 30 mmol/L (ref 22–32)
Calcium: 7.8 mg/dL — ABNORMAL LOW (ref 8.9–10.3)
Chloride: 96 mmol/L — ABNORMAL LOW (ref 98–111)
Creatinine, Ser: 1.08 mg/dL (ref 0.61–1.24)
GFR, Estimated: >60 mL/min (ref >60)
Glucose, Bld: 110 mg/dL — ABNORMAL HIGH (ref 70–99)
Potassium: 3.9 mmol/L (ref 3.5–5.1)
Sodium: 140 mmol/L (ref 135–145)
Total Bilirubin: 1.5 mg/dL — ABNORMAL HIGH (ref 0.3–1.2)
Total Protein: 4.8 g/dL — ABNORMAL LOW (ref 6.5–8.1)

## 2022-11-17 LAB — PROTIME-INR
INR: 2.8 — ABNORMAL HIGH (ref 0.8–1.2)
Prothrombin Time: 29.4 seconds — ABNORMAL HIGH (ref 11.4–15.2)

## 2022-11-17 LAB — CBC
HCT: 36.1 % — ABNORMAL LOW (ref 39.0–52.0)
Hemoglobin: 10.7 g/dL — ABNORMAL LOW (ref 13.0–17.0)
MCH: 27.2 pg (ref 26.0–34.0)
MCHC: 29.6 g/dL — ABNORMAL LOW (ref 30.0–36.0)
MCV: 91.9 fL (ref 80.0–100.0)
Platelets: 93 10*3/uL — ABNORMAL LOW (ref 150–400)
RBC: 3.93 MIL/uL — ABNORMAL LOW (ref 4.22–5.81)
RDW: 20.5 % — ABNORMAL HIGH (ref 11.5–15.5)
WBC: 9.1 10*3/uL (ref 4.0–10.5)
nRBC: 0 % (ref 0.0–0.2)

## 2022-11-18 DIAGNOSIS — N1831 Chronic kidney disease, stage 3a: Secondary | ICD-10-CM

## 2022-11-18 DIAGNOSIS — J449 Chronic obstructive pulmonary disease, unspecified: Secondary | ICD-10-CM

## 2022-11-18 DIAGNOSIS — I5032 Chronic diastolic (congestive) heart failure: Secondary | ICD-10-CM

## 2022-11-18 DIAGNOSIS — I480 Paroxysmal atrial fibrillation: Secondary | ICD-10-CM

## 2022-11-18 DIAGNOSIS — J9621 Acute and chronic respiratory failure with hypoxia: Secondary | ICD-10-CM

## 2022-11-18 LAB — PROTIME-INR
INR: 2.5 — ABNORMAL HIGH (ref 0.8–1.2)
Prothrombin Time: 27.5 seconds — ABNORMAL HIGH (ref 11.4–15.2)

## 2022-11-19 ENCOUNTER — Other Ambulatory Visit (HOSPITAL_COMMUNITY): Payer: Medicare Other

## 2022-11-19 DIAGNOSIS — J449 Chronic obstructive pulmonary disease, unspecified: Secondary | ICD-10-CM

## 2022-11-19 DIAGNOSIS — N1831 Chronic kidney disease, stage 3a: Secondary | ICD-10-CM

## 2022-11-19 DIAGNOSIS — I480 Paroxysmal atrial fibrillation: Secondary | ICD-10-CM

## 2022-11-19 DIAGNOSIS — I5032 Chronic diastolic (congestive) heart failure: Secondary | ICD-10-CM

## 2022-11-19 DIAGNOSIS — J9621 Acute and chronic respiratory failure with hypoxia: Secondary | ICD-10-CM

## 2022-11-19 LAB — LACTIC ACID, PLASMA: Lactic Acid, Venous: 2.5 mmol/L (ref 0.5–1.9)

## 2022-11-20 DIAGNOSIS — J449 Chronic obstructive pulmonary disease, unspecified: Secondary | ICD-10-CM

## 2022-11-20 DIAGNOSIS — J9621 Acute and chronic respiratory failure with hypoxia: Secondary | ICD-10-CM

## 2022-11-20 DIAGNOSIS — I480 Paroxysmal atrial fibrillation: Secondary | ICD-10-CM

## 2022-11-20 DIAGNOSIS — I5032 Chronic diastolic (congestive) heart failure: Secondary | ICD-10-CM

## 2022-11-20 DIAGNOSIS — N1831 Chronic kidney disease, stage 3a: Secondary | ICD-10-CM

## 2022-11-20 LAB — PROTIME-INR
INR: 2.2 — ABNORMAL HIGH (ref 0.8–1.2)
Prothrombin Time: 24.8 s — ABNORMAL HIGH (ref 11.4–15.2)

## 2022-11-20 LAB — CBC
HCT: 37.1 % — ABNORMAL LOW (ref 39.0–52.0)
Hemoglobin: 10.8 g/dL — ABNORMAL LOW (ref 13.0–17.0)
MCH: 26.7 pg (ref 26.0–34.0)
MCHC: 29.1 g/dL — ABNORMAL LOW (ref 30.0–36.0)
MCV: 91.6 fL (ref 80.0–100.0)
Platelets: 98 10*3/uL — ABNORMAL LOW (ref 150–400)
RBC: 4.05 MIL/uL — ABNORMAL LOW (ref 4.22–5.81)
RDW: 20.1 % — ABNORMAL HIGH (ref 11.5–15.5)
WBC: 14.5 10*3/uL — ABNORMAL HIGH (ref 4.0–10.5)
nRBC: 0 % (ref 0.0–0.2)

## 2022-11-20 LAB — LACTIC ACID, PLASMA: Lactic Acid, Venous: 1.9 mmol/L (ref 0.5–1.9)

## 2022-11-20 LAB — T4, FREE: Free T4: 1.13 ng/dL — ABNORMAL HIGH (ref 0.61–1.12)

## 2022-11-21 DIAGNOSIS — J449 Chronic obstructive pulmonary disease, unspecified: Secondary | ICD-10-CM

## 2022-11-21 DIAGNOSIS — J9621 Acute and chronic respiratory failure with hypoxia: Secondary | ICD-10-CM

## 2022-11-21 DIAGNOSIS — I480 Paroxysmal atrial fibrillation: Secondary | ICD-10-CM

## 2022-11-21 DIAGNOSIS — I5032 Chronic diastolic (congestive) heart failure: Secondary | ICD-10-CM

## 2022-11-21 DIAGNOSIS — N1831 Chronic kidney disease, stage 3a: Secondary | ICD-10-CM

## 2022-11-22 DIAGNOSIS — I5032 Chronic diastolic (congestive) heart failure: Secondary | ICD-10-CM

## 2022-11-22 DIAGNOSIS — J9621 Acute and chronic respiratory failure with hypoxia: Secondary | ICD-10-CM

## 2022-11-22 DIAGNOSIS — I480 Paroxysmal atrial fibrillation: Secondary | ICD-10-CM

## 2022-11-22 DIAGNOSIS — J449 Chronic obstructive pulmonary disease, unspecified: Secondary | ICD-10-CM

## 2022-11-22 DIAGNOSIS — N1831 Chronic kidney disease, stage 3a: Secondary | ICD-10-CM

## 2022-11-22 LAB — PROTIME-INR
INR: 2.1 — ABNORMAL HIGH (ref 0.8–1.2)
Prothrombin Time: 23.8 seconds — ABNORMAL HIGH (ref 11.4–15.2)

## 2022-11-23 ENCOUNTER — Other Ambulatory Visit (HOSPITAL_COMMUNITY): Payer: Medicare Other

## 2022-11-23 DIAGNOSIS — J449 Chronic obstructive pulmonary disease, unspecified: Secondary | ICD-10-CM

## 2022-11-23 DIAGNOSIS — N1831 Chronic kidney disease, stage 3a: Secondary | ICD-10-CM

## 2022-11-23 DIAGNOSIS — J9621 Acute and chronic respiratory failure with hypoxia: Secondary | ICD-10-CM

## 2022-11-23 DIAGNOSIS — I480 Paroxysmal atrial fibrillation: Secondary | ICD-10-CM

## 2022-11-23 DIAGNOSIS — I5032 Chronic diastolic (congestive) heart failure: Secondary | ICD-10-CM

## 2022-11-23 LAB — PROTIME-INR
INR: 2.4 — ABNORMAL HIGH (ref 0.8–1.2)
Prothrombin Time: 26.8 seconds — ABNORMAL HIGH (ref 11.4–15.2)

## 2022-11-24 ENCOUNTER — Encounter (HOSPITAL_COMMUNITY): Payer: Medicare Other

## 2022-11-24 DIAGNOSIS — I5032 Chronic diastolic (congestive) heart failure: Secondary | ICD-10-CM

## 2022-11-24 DIAGNOSIS — J9621 Acute and chronic respiratory failure with hypoxia: Secondary | ICD-10-CM

## 2022-11-24 DIAGNOSIS — N1831 Chronic kidney disease, stage 3a: Secondary | ICD-10-CM

## 2022-11-24 DIAGNOSIS — I480 Paroxysmal atrial fibrillation: Secondary | ICD-10-CM

## 2022-11-24 DIAGNOSIS — J449 Chronic obstructive pulmonary disease, unspecified: Secondary | ICD-10-CM

## 2022-11-24 LAB — CULTURE, BLOOD (ROUTINE X 2)
Culture: NO GROWTH
Culture: NO GROWTH
Special Requests: ADEQUATE
Special Requests: ADEQUATE

## 2022-11-25 DIAGNOSIS — J449 Chronic obstructive pulmonary disease, unspecified: Secondary | ICD-10-CM

## 2022-11-25 DIAGNOSIS — I5032 Chronic diastolic (congestive) heart failure: Secondary | ICD-10-CM

## 2022-11-25 DIAGNOSIS — N1831 Chronic kidney disease, stage 3a: Secondary | ICD-10-CM

## 2022-11-25 DIAGNOSIS — I480 Paroxysmal atrial fibrillation: Secondary | ICD-10-CM

## 2022-11-25 DIAGNOSIS — J9621 Acute and chronic respiratory failure with hypoxia: Secondary | ICD-10-CM

## 2022-11-26 DIAGNOSIS — I5032 Chronic diastolic (congestive) heart failure: Secondary | ICD-10-CM

## 2022-11-26 DIAGNOSIS — J449 Chronic obstructive pulmonary disease, unspecified: Secondary | ICD-10-CM

## 2022-11-26 DIAGNOSIS — J9621 Acute and chronic respiratory failure with hypoxia: Secondary | ICD-10-CM

## 2022-11-26 DIAGNOSIS — N1831 Chronic kidney disease, stage 3a: Secondary | ICD-10-CM

## 2022-11-26 DIAGNOSIS — I480 Paroxysmal atrial fibrillation: Secondary | ICD-10-CM

## 2022-11-26 LAB — PROTIME-INR
INR: 2.5 — ABNORMAL HIGH (ref 0.8–1.2)
Prothrombin Time: 27.5 seconds — ABNORMAL HIGH (ref 11.4–15.2)

## 2022-11-27 DIAGNOSIS — J9621 Acute and chronic respiratory failure with hypoxia: Secondary | ICD-10-CM

## 2022-11-27 DIAGNOSIS — N1831 Chronic kidney disease, stage 3a: Secondary | ICD-10-CM

## 2022-11-27 DIAGNOSIS — I480 Paroxysmal atrial fibrillation: Secondary | ICD-10-CM

## 2022-11-27 DIAGNOSIS — J449 Chronic obstructive pulmonary disease, unspecified: Secondary | ICD-10-CM

## 2022-11-27 DIAGNOSIS — I5032 Chronic diastolic (congestive) heart failure: Secondary | ICD-10-CM

## 2022-11-27 LAB — CBC
HCT: 35.2 % — ABNORMAL LOW (ref 39.0–52.0)
Hemoglobin: 10.5 g/dL — ABNORMAL LOW (ref 13.0–17.0)
MCH: 27.3 pg (ref 26.0–34.0)
MCHC: 29.8 g/dL — ABNORMAL LOW (ref 30.0–36.0)
MCV: 91.4 fL (ref 80.0–100.0)
Platelets: 95 10*3/uL — ABNORMAL LOW (ref 150–400)
RBC: 3.85 MIL/uL — ABNORMAL LOW (ref 4.22–5.81)
RDW: 20.4 % — ABNORMAL HIGH (ref 11.5–15.5)
WBC: 9.5 10*3/uL (ref 4.0–10.5)
nRBC: 0 % (ref 0.0–0.2)

## 2022-11-27 LAB — BASIC METABOLIC PANEL WITH GFR
Anion gap: 5 (ref 5–15)
BUN: 27 mg/dL — ABNORMAL HIGH (ref 8–23)
CO2: 26 mmol/L (ref 22–32)
Calcium: 7.6 mg/dL — ABNORMAL LOW (ref 8.9–10.3)
Chloride: 109 mmol/L (ref 98–111)
Creatinine, Ser: 1.01 mg/dL (ref 0.61–1.24)
GFR, Estimated: >60 mL/min (ref >60)
Glucose, Bld: 112 mg/dL — ABNORMAL HIGH (ref 70–99)
Potassium: 4 mmol/L (ref 3.5–5.1)
Sodium: 140 mmol/L (ref 135–145)

## 2022-11-27 LAB — PROTIME-INR
INR: 2.1 — ABNORMAL HIGH (ref 0.8–1.2)
Prothrombin Time: 23.5 s — ABNORMAL HIGH (ref 11.4–15.2)

## 2022-11-27 LAB — MAGNESIUM: Magnesium: 2.2 mg/dL (ref 1.7–2.4)

## 2022-11-28 DIAGNOSIS — I5032 Chronic diastolic (congestive) heart failure: Secondary | ICD-10-CM

## 2022-11-28 DIAGNOSIS — J449 Chronic obstructive pulmonary disease, unspecified: Secondary | ICD-10-CM

## 2022-11-28 DIAGNOSIS — I480 Paroxysmal atrial fibrillation: Secondary | ICD-10-CM

## 2022-11-28 DIAGNOSIS — J9621 Acute and chronic respiratory failure with hypoxia: Secondary | ICD-10-CM

## 2022-11-28 DIAGNOSIS — N1831 Chronic kidney disease, stage 3a: Secondary | ICD-10-CM

## 2022-11-29 DIAGNOSIS — I480 Paroxysmal atrial fibrillation: Secondary | ICD-10-CM

## 2022-11-29 DIAGNOSIS — N1831 Chronic kidney disease, stage 3a: Secondary | ICD-10-CM

## 2022-11-29 DIAGNOSIS — I5032 Chronic diastolic (congestive) heart failure: Secondary | ICD-10-CM

## 2022-11-29 DIAGNOSIS — J9621 Acute and chronic respiratory failure with hypoxia: Secondary | ICD-10-CM

## 2022-11-29 DIAGNOSIS — J449 Chronic obstructive pulmonary disease, unspecified: Secondary | ICD-10-CM

## 2022-11-30 DIAGNOSIS — I480 Paroxysmal atrial fibrillation: Secondary | ICD-10-CM

## 2022-11-30 DIAGNOSIS — N1831 Chronic kidney disease, stage 3a: Secondary | ICD-10-CM

## 2022-11-30 DIAGNOSIS — J449 Chronic obstructive pulmonary disease, unspecified: Secondary | ICD-10-CM

## 2022-11-30 DIAGNOSIS — I5032 Chronic diastolic (congestive) heart failure: Secondary | ICD-10-CM

## 2022-11-30 DIAGNOSIS — J9621 Acute and chronic respiratory failure with hypoxia: Secondary | ICD-10-CM

## 2022-12-01 DIAGNOSIS — N1831 Chronic kidney disease, stage 3a: Secondary | ICD-10-CM

## 2022-12-01 DIAGNOSIS — I5032 Chronic diastolic (congestive) heart failure: Secondary | ICD-10-CM

## 2022-12-01 DIAGNOSIS — I480 Paroxysmal atrial fibrillation: Secondary | ICD-10-CM

## 2022-12-01 DIAGNOSIS — J9621 Acute and chronic respiratory failure with hypoxia: Secondary | ICD-10-CM

## 2022-12-01 DIAGNOSIS — J449 Chronic obstructive pulmonary disease, unspecified: Secondary | ICD-10-CM

## 2022-12-02 DIAGNOSIS — J99 Respiratory disorders in diseases classified elsewhere: Secondary | ICD-10-CM | POA: Diagnosis not present

## 2022-12-02 DIAGNOSIS — R051 Acute cough: Secondary | ICD-10-CM | POA: Diagnosis not present

## 2022-12-02 DIAGNOSIS — R531 Weakness: Secondary | ICD-10-CM | POA: Diagnosis not present

## 2022-12-02 DIAGNOSIS — J449 Chronic obstructive pulmonary disease, unspecified: Secondary | ICD-10-CM | POA: Diagnosis not present

## 2022-12-02 DIAGNOSIS — Z743 Need for continuous supervision: Secondary | ICD-10-CM | POA: Diagnosis not present

## 2022-12-02 DIAGNOSIS — M6281 Muscle weakness (generalized): Secondary | ICD-10-CM | POA: Diagnosis not present

## 2022-12-02 DIAGNOSIS — R498 Other voice and resonance disorders: Secondary | ICD-10-CM | POA: Diagnosis not present

## 2022-12-02 DIAGNOSIS — I5032 Chronic diastolic (congestive) heart failure: Secondary | ICD-10-CM | POA: Diagnosis not present

## 2022-12-02 DIAGNOSIS — I998 Other disorder of circulatory system: Secondary | ICD-10-CM | POA: Diagnosis not present

## 2022-12-02 DIAGNOSIS — J9620 Acute and chronic respiratory failure, unspecified whether with hypoxia or hypercapnia: Secondary | ICD-10-CM | POA: Diagnosis not present

## 2022-12-02 DIAGNOSIS — N401 Enlarged prostate with lower urinary tract symptoms: Secondary | ICD-10-CM | POA: Diagnosis not present

## 2022-12-02 DIAGNOSIS — D500 Iron deficiency anemia secondary to blood loss (chronic): Secondary | ICD-10-CM | POA: Diagnosis not present

## 2022-12-02 DIAGNOSIS — I739 Peripheral vascular disease, unspecified: Secondary | ICD-10-CM | POA: Diagnosis not present

## 2022-12-02 DIAGNOSIS — R296 Repeated falls: Secondary | ICD-10-CM | POA: Diagnosis not present

## 2022-12-02 DIAGNOSIS — R442 Other hallucinations: Secondary | ICD-10-CM | POA: Diagnosis not present

## 2022-12-02 DIAGNOSIS — L89626 Pressure-induced deep tissue damage of left heel: Secondary | ICD-10-CM | POA: Diagnosis not present

## 2022-12-02 DIAGNOSIS — J440 Chronic obstructive pulmonary disease with acute lower respiratory infection: Secondary | ICD-10-CM | POA: Diagnosis not present

## 2022-12-02 DIAGNOSIS — L89150 Pressure ulcer of sacral region, unstageable: Secondary | ICD-10-CM | POA: Diagnosis not present

## 2022-12-02 DIAGNOSIS — I7389 Other specified peripheral vascular diseases: Secondary | ICD-10-CM | POA: Diagnosis not present

## 2022-12-02 DIAGNOSIS — N1832 Chronic kidney disease, stage 3b: Secondary | ICD-10-CM | POA: Diagnosis not present

## 2022-12-02 DIAGNOSIS — J9601 Acute respiratory failure with hypoxia: Secondary | ICD-10-CM | POA: Diagnosis not present

## 2022-12-02 DIAGNOSIS — J9621 Acute and chronic respiratory failure with hypoxia: Secondary | ICD-10-CM | POA: Diagnosis not present

## 2022-12-02 DIAGNOSIS — J9600 Acute respiratory failure, unspecified whether with hypoxia or hypercapnia: Secondary | ICD-10-CM | POA: Diagnosis not present

## 2022-12-02 DIAGNOSIS — I480 Paroxysmal atrial fibrillation: Secondary | ICD-10-CM | POA: Diagnosis not present

## 2022-12-02 DIAGNOSIS — M6259 Muscle wasting and atrophy, not elsewhere classified, multiple sites: Secondary | ICD-10-CM | POA: Diagnosis not present

## 2022-12-02 DIAGNOSIS — I10 Essential (primary) hypertension: Secondary | ICD-10-CM | POA: Diagnosis not present

## 2022-12-02 DIAGNOSIS — I130 Hypertensive heart and chronic kidney disease with heart failure and stage 1 through stage 4 chronic kidney disease, or unspecified chronic kidney disease: Secondary | ICD-10-CM | POA: Diagnosis not present

## 2022-12-02 DIAGNOSIS — R278 Other lack of coordination: Secondary | ICD-10-CM | POA: Diagnosis not present

## 2022-12-02 DIAGNOSIS — Z741 Need for assistance with personal care: Secondary | ICD-10-CM | POA: Diagnosis not present

## 2022-12-02 DIAGNOSIS — I110 Hypertensive heart disease with heart failure: Secondary | ICD-10-CM | POA: Diagnosis not present

## 2022-12-02 DIAGNOSIS — I4891 Unspecified atrial fibrillation: Secondary | ICD-10-CM | POA: Diagnosis not present

## 2022-12-02 DIAGNOSIS — J939 Pneumothorax, unspecified: Secondary | ICD-10-CM | POA: Diagnosis not present

## 2022-12-02 DIAGNOSIS — L89890 Pressure ulcer of other site, unstageable: Secondary | ICD-10-CM | POA: Diagnosis not present

## 2022-12-02 DIAGNOSIS — Z7401 Bed confinement status: Secondary | ICD-10-CM | POA: Diagnosis not present

## 2022-12-02 DIAGNOSIS — I255 Ischemic cardiomyopathy: Secondary | ICD-10-CM | POA: Diagnosis not present

## 2022-12-02 DIAGNOSIS — E785 Hyperlipidemia, unspecified: Secondary | ICD-10-CM | POA: Diagnosis not present

## 2022-12-02 DIAGNOSIS — R1311 Dysphagia, oral phase: Secondary | ICD-10-CM | POA: Diagnosis not present

## 2022-12-02 DIAGNOSIS — G9009 Other idiopathic peripheral autonomic neuropathy: Secondary | ICD-10-CM | POA: Diagnosis not present

## 2022-12-02 DIAGNOSIS — J189 Pneumonia, unspecified organism: Secondary | ICD-10-CM | POA: Diagnosis not present

## 2022-12-02 DIAGNOSIS — N1831 Chronic kidney disease, stage 3a: Secondary | ICD-10-CM | POA: Diagnosis not present

## 2022-12-02 DIAGNOSIS — I714 Abdominal aortic aneurysm, without rupture, unspecified: Secondary | ICD-10-CM | POA: Diagnosis not present

## 2022-12-02 DIAGNOSIS — I70235 Atherosclerosis of native arteries of right leg with ulceration of other part of foot: Secondary | ICD-10-CM | POA: Diagnosis not present

## 2022-12-02 DIAGNOSIS — R001 Bradycardia, unspecified: Secondary | ICD-10-CM | POA: Diagnosis not present

## 2022-12-05 DIAGNOSIS — D500 Iron deficiency anemia secondary to blood loss (chronic): Secondary | ICD-10-CM | POA: Diagnosis not present

## 2022-12-05 DIAGNOSIS — J440 Chronic obstructive pulmonary disease with acute lower respiratory infection: Secondary | ICD-10-CM | POA: Diagnosis not present

## 2022-12-05 DIAGNOSIS — J189 Pneumonia, unspecified organism: Secondary | ICD-10-CM | POA: Diagnosis not present

## 2022-12-05 DIAGNOSIS — I7389 Other specified peripheral vascular diseases: Secondary | ICD-10-CM | POA: Diagnosis not present

## 2022-12-05 DIAGNOSIS — J9601 Acute respiratory failure with hypoxia: Secondary | ICD-10-CM | POA: Diagnosis not present

## 2022-12-05 DIAGNOSIS — I714 Abdominal aortic aneurysm, without rupture, unspecified: Secondary | ICD-10-CM | POA: Diagnosis not present

## 2022-12-05 DIAGNOSIS — I480 Paroxysmal atrial fibrillation: Secondary | ICD-10-CM | POA: Diagnosis not present

## 2022-12-05 DIAGNOSIS — I130 Hypertensive heart and chronic kidney disease with heart failure and stage 1 through stage 4 chronic kidney disease, or unspecified chronic kidney disease: Secondary | ICD-10-CM | POA: Diagnosis not present

## 2022-12-05 DIAGNOSIS — N1831 Chronic kidney disease, stage 3a: Secondary | ICD-10-CM | POA: Diagnosis not present

## 2022-12-05 DIAGNOSIS — I5032 Chronic diastolic (congestive) heart failure: Secondary | ICD-10-CM | POA: Diagnosis not present

## 2022-12-05 DIAGNOSIS — M6281 Muscle weakness (generalized): Secondary | ICD-10-CM | POA: Diagnosis not present

## 2022-12-06 DIAGNOSIS — I480 Paroxysmal atrial fibrillation: Secondary | ICD-10-CM | POA: Diagnosis not present

## 2022-12-06 DIAGNOSIS — I7389 Other specified peripheral vascular diseases: Secondary | ICD-10-CM | POA: Diagnosis not present

## 2022-12-06 DIAGNOSIS — N1831 Chronic kidney disease, stage 3a: Secondary | ICD-10-CM | POA: Diagnosis not present

## 2022-12-06 DIAGNOSIS — M6281 Muscle weakness (generalized): Secondary | ICD-10-CM | POA: Diagnosis not present

## 2022-12-06 DIAGNOSIS — J440 Chronic obstructive pulmonary disease with acute lower respiratory infection: Secondary | ICD-10-CM | POA: Diagnosis not present

## 2022-12-06 DIAGNOSIS — L89626 Pressure-induced deep tissue damage of left heel: Secondary | ICD-10-CM | POA: Diagnosis not present

## 2022-12-06 DIAGNOSIS — I130 Hypertensive heart and chronic kidney disease with heart failure and stage 1 through stage 4 chronic kidney disease, or unspecified chronic kidney disease: Secondary | ICD-10-CM | POA: Diagnosis not present

## 2022-12-06 DIAGNOSIS — J9601 Acute respiratory failure with hypoxia: Secondary | ICD-10-CM | POA: Diagnosis not present

## 2022-12-06 DIAGNOSIS — J189 Pneumonia, unspecified organism: Secondary | ICD-10-CM | POA: Diagnosis not present

## 2022-12-06 DIAGNOSIS — I714 Abdominal aortic aneurysm, without rupture, unspecified: Secondary | ICD-10-CM | POA: Diagnosis not present

## 2022-12-06 DIAGNOSIS — L89150 Pressure ulcer of sacral region, unstageable: Secondary | ICD-10-CM | POA: Diagnosis not present

## 2022-12-06 DIAGNOSIS — I5032 Chronic diastolic (congestive) heart failure: Secondary | ICD-10-CM | POA: Diagnosis not present

## 2022-12-07 DIAGNOSIS — D500 Iron deficiency anemia secondary to blood loss (chronic): Secondary | ICD-10-CM | POA: Diagnosis not present

## 2022-12-07 DIAGNOSIS — I130 Hypertensive heart and chronic kidney disease with heart failure and stage 1 through stage 4 chronic kidney disease, or unspecified chronic kidney disease: Secondary | ICD-10-CM | POA: Diagnosis not present

## 2022-12-07 DIAGNOSIS — Z741 Need for assistance with personal care: Secondary | ICD-10-CM | POA: Diagnosis not present

## 2022-12-07 DIAGNOSIS — I480 Paroxysmal atrial fibrillation: Secondary | ICD-10-CM | POA: Diagnosis not present

## 2022-12-07 DIAGNOSIS — I7389 Other specified peripheral vascular diseases: Secondary | ICD-10-CM | POA: Diagnosis not present

## 2022-12-07 DIAGNOSIS — I714 Abdominal aortic aneurysm, without rupture, unspecified: Secondary | ICD-10-CM | POA: Diagnosis not present

## 2022-12-07 DIAGNOSIS — J449 Chronic obstructive pulmonary disease, unspecified: Secondary | ICD-10-CM | POA: Diagnosis not present

## 2022-12-07 DIAGNOSIS — I5032 Chronic diastolic (congestive) heart failure: Secondary | ICD-10-CM | POA: Diagnosis not present

## 2022-12-07 DIAGNOSIS — J189 Pneumonia, unspecified organism: Secondary | ICD-10-CM | POA: Diagnosis not present

## 2022-12-07 DIAGNOSIS — J9601 Acute respiratory failure with hypoxia: Secondary | ICD-10-CM | POA: Diagnosis not present

## 2022-12-07 DIAGNOSIS — J440 Chronic obstructive pulmonary disease with acute lower respiratory infection: Secondary | ICD-10-CM | POA: Diagnosis not present

## 2022-12-07 DIAGNOSIS — N1831 Chronic kidney disease, stage 3a: Secondary | ICD-10-CM | POA: Diagnosis not present

## 2022-12-07 DIAGNOSIS — M6281 Muscle weakness (generalized): Secondary | ICD-10-CM | POA: Diagnosis not present

## 2022-12-07 DIAGNOSIS — M6259 Muscle wasting and atrophy, not elsewhere classified, multiple sites: Secondary | ICD-10-CM | POA: Diagnosis not present

## 2022-12-08 DIAGNOSIS — I480 Paroxysmal atrial fibrillation: Secondary | ICD-10-CM | POA: Diagnosis not present

## 2022-12-08 DIAGNOSIS — I130 Hypertensive heart and chronic kidney disease with heart failure and stage 1 through stage 4 chronic kidney disease, or unspecified chronic kidney disease: Secondary | ICD-10-CM | POA: Diagnosis not present

## 2022-12-08 DIAGNOSIS — I7389 Other specified peripheral vascular diseases: Secondary | ICD-10-CM | POA: Diagnosis not present

## 2022-12-08 DIAGNOSIS — J9601 Acute respiratory failure with hypoxia: Secondary | ICD-10-CM | POA: Diagnosis not present

## 2022-12-08 DIAGNOSIS — D500 Iron deficiency anemia secondary to blood loss (chronic): Secondary | ICD-10-CM | POA: Diagnosis not present

## 2022-12-08 DIAGNOSIS — N1831 Chronic kidney disease, stage 3a: Secondary | ICD-10-CM | POA: Diagnosis not present

## 2022-12-08 DIAGNOSIS — M6281 Muscle weakness (generalized): Secondary | ICD-10-CM | POA: Diagnosis not present

## 2022-12-08 DIAGNOSIS — I714 Abdominal aortic aneurysm, without rupture, unspecified: Secondary | ICD-10-CM | POA: Diagnosis not present

## 2022-12-08 DIAGNOSIS — I5032 Chronic diastolic (congestive) heart failure: Secondary | ICD-10-CM | POA: Diagnosis not present

## 2022-12-08 DIAGNOSIS — J440 Chronic obstructive pulmonary disease with acute lower respiratory infection: Secondary | ICD-10-CM | POA: Diagnosis not present

## 2022-12-08 DIAGNOSIS — J189 Pneumonia, unspecified organism: Secondary | ICD-10-CM | POA: Diagnosis not present

## 2022-12-12 DIAGNOSIS — J440 Chronic obstructive pulmonary disease with acute lower respiratory infection: Secondary | ICD-10-CM | POA: Diagnosis not present

## 2022-12-12 DIAGNOSIS — I130 Hypertensive heart and chronic kidney disease with heart failure and stage 1 through stage 4 chronic kidney disease, or unspecified chronic kidney disease: Secondary | ICD-10-CM | POA: Diagnosis not present

## 2022-12-12 DIAGNOSIS — I714 Abdominal aortic aneurysm, without rupture, unspecified: Secondary | ICD-10-CM | POA: Diagnosis not present

## 2022-12-12 DIAGNOSIS — J9601 Acute respiratory failure with hypoxia: Secondary | ICD-10-CM | POA: Diagnosis not present

## 2022-12-12 DIAGNOSIS — N1831 Chronic kidney disease, stage 3a: Secondary | ICD-10-CM | POA: Diagnosis not present

## 2022-12-12 DIAGNOSIS — M6281 Muscle weakness (generalized): Secondary | ICD-10-CM | POA: Diagnosis not present

## 2022-12-12 DIAGNOSIS — L89890 Pressure ulcer of other site, unstageable: Secondary | ICD-10-CM | POA: Diagnosis not present

## 2022-12-12 DIAGNOSIS — I5032 Chronic diastolic (congestive) heart failure: Secondary | ICD-10-CM | POA: Diagnosis not present

## 2022-12-12 DIAGNOSIS — I7389 Other specified peripheral vascular diseases: Secondary | ICD-10-CM | POA: Diagnosis not present

## 2022-12-12 DIAGNOSIS — J189 Pneumonia, unspecified organism: Secondary | ICD-10-CM | POA: Diagnosis not present

## 2022-12-12 DIAGNOSIS — I480 Paroxysmal atrial fibrillation: Secondary | ICD-10-CM | POA: Diagnosis not present

## 2022-12-13 ENCOUNTER — Other Ambulatory Visit: Payer: Self-pay

## 2022-12-13 DIAGNOSIS — L89626 Pressure-induced deep tissue damage of left heel: Secondary | ICD-10-CM | POA: Diagnosis not present

## 2022-12-13 DIAGNOSIS — G9009 Other idiopathic peripheral autonomic neuropathy: Secondary | ICD-10-CM | POA: Diagnosis not present

## 2022-12-13 DIAGNOSIS — M6259 Muscle wasting and atrophy, not elsewhere classified, multiple sites: Secondary | ICD-10-CM | POA: Diagnosis not present

## 2022-12-13 DIAGNOSIS — J189 Pneumonia, unspecified organism: Secondary | ICD-10-CM | POA: Diagnosis not present

## 2022-12-13 DIAGNOSIS — Z741 Need for assistance with personal care: Secondary | ICD-10-CM | POA: Diagnosis not present

## 2022-12-13 DIAGNOSIS — J440 Chronic obstructive pulmonary disease with acute lower respiratory infection: Secondary | ICD-10-CM | POA: Diagnosis not present

## 2022-12-13 DIAGNOSIS — J449 Chronic obstructive pulmonary disease, unspecified: Secondary | ICD-10-CM | POA: Diagnosis not present

## 2022-12-13 DIAGNOSIS — I5032 Chronic diastolic (congestive) heart failure: Secondary | ICD-10-CM | POA: Diagnosis not present

## 2022-12-13 DIAGNOSIS — J9601 Acute respiratory failure with hypoxia: Secondary | ICD-10-CM | POA: Diagnosis not present

## 2022-12-13 DIAGNOSIS — I714 Abdominal aortic aneurysm, without rupture, unspecified: Secondary | ICD-10-CM | POA: Diagnosis not present

## 2022-12-13 DIAGNOSIS — I480 Paroxysmal atrial fibrillation: Secondary | ICD-10-CM | POA: Diagnosis not present

## 2022-12-13 DIAGNOSIS — N1831 Chronic kidney disease, stage 3a: Secondary | ICD-10-CM | POA: Diagnosis not present

## 2022-12-13 DIAGNOSIS — I7389 Other specified peripheral vascular diseases: Secondary | ICD-10-CM | POA: Diagnosis not present

## 2022-12-13 DIAGNOSIS — L89150 Pressure ulcer of sacral region, unstageable: Secondary | ICD-10-CM | POA: Diagnosis not present

## 2022-12-13 DIAGNOSIS — I130 Hypertensive heart and chronic kidney disease with heart failure and stage 1 through stage 4 chronic kidney disease, or unspecified chronic kidney disease: Secondary | ICD-10-CM | POA: Diagnosis not present

## 2022-12-13 DIAGNOSIS — D500 Iron deficiency anemia secondary to blood loss (chronic): Secondary | ICD-10-CM | POA: Diagnosis not present

## 2022-12-13 DIAGNOSIS — M6281 Muscle weakness (generalized): Secondary | ICD-10-CM | POA: Diagnosis not present

## 2022-12-15 DIAGNOSIS — M6281 Muscle weakness (generalized): Secondary | ICD-10-CM | POA: Diagnosis not present

## 2022-12-15 DIAGNOSIS — I5032 Chronic diastolic (congestive) heart failure: Secondary | ICD-10-CM | POA: Diagnosis not present

## 2022-12-15 DIAGNOSIS — I480 Paroxysmal atrial fibrillation: Secondary | ICD-10-CM | POA: Diagnosis not present

## 2022-12-15 DIAGNOSIS — J189 Pneumonia, unspecified organism: Secondary | ICD-10-CM | POA: Diagnosis not present

## 2022-12-15 DIAGNOSIS — J9601 Acute respiratory failure with hypoxia: Secondary | ICD-10-CM | POA: Diagnosis not present

## 2022-12-15 DIAGNOSIS — I714 Abdominal aortic aneurysm, without rupture, unspecified: Secondary | ICD-10-CM | POA: Diagnosis not present

## 2022-12-15 DIAGNOSIS — L89890 Pressure ulcer of other site, unstageable: Secondary | ICD-10-CM | POA: Diagnosis not present

## 2022-12-15 DIAGNOSIS — J440 Chronic obstructive pulmonary disease with acute lower respiratory infection: Secondary | ICD-10-CM | POA: Diagnosis not present

## 2022-12-15 DIAGNOSIS — N1831 Chronic kidney disease, stage 3a: Secondary | ICD-10-CM | POA: Diagnosis not present

## 2022-12-15 DIAGNOSIS — I130 Hypertensive heart and chronic kidney disease with heart failure and stage 1 through stage 4 chronic kidney disease, or unspecified chronic kidney disease: Secondary | ICD-10-CM | POA: Diagnosis not present

## 2022-12-15 DIAGNOSIS — I7389 Other specified peripheral vascular diseases: Secondary | ICD-10-CM | POA: Diagnosis not present

## 2022-12-16 DIAGNOSIS — M6259 Muscle wasting and atrophy, not elsewhere classified, multiple sites: Secondary | ICD-10-CM | POA: Diagnosis not present

## 2022-12-16 DIAGNOSIS — J449 Chronic obstructive pulmonary disease, unspecified: Secondary | ICD-10-CM | POA: Diagnosis not present

## 2022-12-16 DIAGNOSIS — J9601 Acute respiratory failure with hypoxia: Secondary | ICD-10-CM | POA: Diagnosis not present

## 2022-12-16 DIAGNOSIS — M6281 Muscle weakness (generalized): Secondary | ICD-10-CM | POA: Diagnosis not present

## 2022-12-16 DIAGNOSIS — I480 Paroxysmal atrial fibrillation: Secondary | ICD-10-CM | POA: Diagnosis not present

## 2022-12-16 DIAGNOSIS — Z741 Need for assistance with personal care: Secondary | ICD-10-CM | POA: Diagnosis not present

## 2022-12-19 DIAGNOSIS — R296 Repeated falls: Secondary | ICD-10-CM | POA: Diagnosis not present

## 2022-12-19 DIAGNOSIS — R442 Other hallucinations: Secondary | ICD-10-CM | POA: Diagnosis not present

## 2022-12-19 DIAGNOSIS — R051 Acute cough: Secondary | ICD-10-CM | POA: Diagnosis not present

## 2022-12-23 DIAGNOSIS — J440 Chronic obstructive pulmonary disease with acute lower respiratory infection: Secondary | ICD-10-CM | POA: Diagnosis not present

## 2022-12-23 DIAGNOSIS — I7389 Other specified peripheral vascular diseases: Secondary | ICD-10-CM | POA: Diagnosis not present

## 2022-12-23 DIAGNOSIS — L89890 Pressure ulcer of other site, unstageable: Secondary | ICD-10-CM | POA: Diagnosis not present

## 2022-12-23 DIAGNOSIS — J189 Pneumonia, unspecified organism: Secondary | ICD-10-CM | POA: Diagnosis not present

## 2022-12-29 DIAGNOSIS — R296 Repeated falls: Secondary | ICD-10-CM | POA: Diagnosis not present

## 2023-01-03 DIAGNOSIS — J449 Chronic obstructive pulmonary disease, unspecified: Secondary | ICD-10-CM | POA: Diagnosis not present

## 2023-01-03 DIAGNOSIS — M6281 Muscle weakness (generalized): Secondary | ICD-10-CM | POA: Diagnosis not present

## 2023-01-03 DIAGNOSIS — L89890 Pressure ulcer of other site, unstageable: Secondary | ICD-10-CM | POA: Diagnosis not present

## 2023-01-03 DIAGNOSIS — I739 Peripheral vascular disease, unspecified: Secondary | ICD-10-CM | POA: Diagnosis not present

## 2023-01-03 DIAGNOSIS — I480 Paroxysmal atrial fibrillation: Secondary | ICD-10-CM | POA: Diagnosis not present

## 2023-01-09 DIAGNOSIS — I739 Peripheral vascular disease, unspecified: Secondary | ICD-10-CM | POA: Diagnosis not present

## 2023-01-09 DIAGNOSIS — I480 Paroxysmal atrial fibrillation: Secondary | ICD-10-CM | POA: Diagnosis not present

## 2023-01-09 DIAGNOSIS — M6281 Muscle weakness (generalized): Secondary | ICD-10-CM | POA: Diagnosis not present

## 2023-01-09 DIAGNOSIS — J449 Chronic obstructive pulmonary disease, unspecified: Secondary | ICD-10-CM | POA: Diagnosis not present

## 2023-01-09 DIAGNOSIS — L89890 Pressure ulcer of other site, unstageable: Secondary | ICD-10-CM | POA: Diagnosis not present

## 2023-01-10 ENCOUNTER — Telehealth: Payer: Self-pay

## 2023-01-11 NOTE — Telephone Encounter (Signed)
I appreciate the message.  He was a very nice patient.

## 2023-01-11 NOTE — Telephone Encounter (Signed)
Pt's wife, [name], LVM this morning reporting pt passed away lat last night. She wanted to thank all of [facility] and Dr. Patsy Lager for the wonderful care [name] received.

## 2023-01-11 NOTE — Telephone Encounter (Signed)
LVM for [name] giving my condolences.

## 2023-01-16 ENCOUNTER — Ambulatory Visit: Payer: Self-pay

## 2023-01-16 NOTE — Progress Notes (Signed)
Resolving anticoagulation encounters.

## 2023-01-24 NOTE — Telephone Encounter (Signed)
Pt's wife, [name], called to report pt is currently in hospice and has been unconscious since last night. She wanted coumadin clinic and PCP to know of pt's progress. She appreciates all the care he received from [facility] and PCP. Advised if anything is needed in the future to not hesitate to contact the coumadin clinic. [name] verbalized understanding.

## 2023-01-24 DEATH — deceased
